# Patient Record
Sex: Male | Born: 1945 | Race: White | Hispanic: No | Marital: Married | Smoking: Never smoker
Health system: Southern US, Community
[De-identification: ages and names within clinical notes are randomized; demographics above are authoritative.]

## PROBLEM LIST (undated history)

## (undated) DIAGNOSIS — R0600 Dyspnea, unspecified: Secondary | ICD-10-CM

## (undated) DIAGNOSIS — I25810 Atherosclerosis of coronary artery bypass graft(s) without angina pectoris: Secondary | ICD-10-CM

## (undated) DIAGNOSIS — N200 Calculus of kidney: Secondary | ICD-10-CM

## (undated) DIAGNOSIS — I4510 Unspecified right bundle-branch block: Secondary | ICD-10-CM

## (undated) DIAGNOSIS — E119 Type 2 diabetes mellitus without complications: Secondary | ICD-10-CM

## (undated) DIAGNOSIS — I2119 ST elevation (STEMI) myocardial infarction involving other coronary artery of inferior wall: Secondary | ICD-10-CM

## (undated) DIAGNOSIS — Z8679 Personal history of other diseases of the circulatory system: Secondary | ICD-10-CM

## (undated) DIAGNOSIS — E785 Hyperlipidemia, unspecified: Secondary | ICD-10-CM

## (undated) DIAGNOSIS — G4730 Sleep apnea, unspecified: Secondary | ICD-10-CM

## (undated) DIAGNOSIS — G4734 Idiopathic sleep related nonobstructive alveolar hypoventilation: Secondary | ICD-10-CM

## (undated) DIAGNOSIS — N201 Calculus of ureter: Secondary | ICD-10-CM

## (undated) DIAGNOSIS — G4733 Obstructive sleep apnea (adult) (pediatric): Secondary | ICD-10-CM

## (undated) DIAGNOSIS — I739 Peripheral vascular disease, unspecified: Secondary | ICD-10-CM

## (undated) DIAGNOSIS — N529 Male erectile dysfunction, unspecified: Secondary | ICD-10-CM

## (undated) DIAGNOSIS — N183 Chronic kidney disease, stage 3 unspecified: Secondary | ICD-10-CM

## (undated) DIAGNOSIS — I10 Essential (primary) hypertension: Secondary | ICD-10-CM

## (undated) DIAGNOSIS — Z87442 Personal history of urinary calculi: Secondary | ICD-10-CM

## (undated) DIAGNOSIS — I6523 Occlusion and stenosis of bilateral carotid arteries: Secondary | ICD-10-CM

## (undated) DIAGNOSIS — R7989 Other specified abnormal findings of blood chemistry: Secondary | ICD-10-CM

## (undated) DIAGNOSIS — N261 Atrophy of kidney (terminal): Secondary | ICD-10-CM

## (undated) DIAGNOSIS — Z794 Long term (current) use of insulin: Secondary | ICD-10-CM

## (undated) DIAGNOSIS — I491 Atrial premature depolarization: Secondary | ICD-10-CM

## (undated) DIAGNOSIS — I509 Heart failure, unspecified: Secondary | ICD-10-CM

## (undated) DIAGNOSIS — I252 Old myocardial infarction: Secondary | ICD-10-CM

## (undated) DIAGNOSIS — I2510 Atherosclerotic heart disease of native coronary artery without angina pectoris: Secondary | ICD-10-CM

## (undated) DIAGNOSIS — R778 Other specified abnormalities of plasma proteins: Secondary | ICD-10-CM

## (undated) DIAGNOSIS — Z973 Presence of spectacles and contact lenses: Secondary | ICD-10-CM

## (undated) DIAGNOSIS — K219 Gastro-esophageal reflux disease without esophagitis: Secondary | ICD-10-CM

## (undated) HISTORY — DX: Peripheral vascular disease, unspecified: I73.9

## (undated) HISTORY — PX: POLYPECTOMY: SHX149

## (undated) HISTORY — PX: COLONOSCOPY: SHX174

## (undated) HISTORY — DX: Essential (primary) hypertension: I10

## (undated) HISTORY — DX: Sleep apnea, unspecified: G47.30

## (undated) HISTORY — PX: URETEROSCOPY WITH HOLMIUM LASER LITHOTRIPSY: SHX6645

## (undated) HISTORY — DX: Other specified abnormal findings of blood chemistry: R79.89

## (undated) HISTORY — DX: Obstructive sleep apnea (adult) (pediatric): G47.33

## (undated) HISTORY — DX: Other specified abnormalities of plasma proteins: R77.8

## (undated) HISTORY — DX: Male erectile dysfunction, unspecified: N52.9

## (undated) HISTORY — DX: Idiopathic sleep related nonobstructive alveolar hypoventilation: G47.34

## (undated) HISTORY — DX: Gastro-esophageal reflux disease without esophagitis: K21.9

## (undated) HISTORY — DX: Atherosclerosis of coronary artery bypass graft(s) without angina pectoris: I25.810

## (undated) HISTORY — DX: Hyperlipidemia, unspecified: E78.5

## (undated) HISTORY — PX: LEG SURGERY: SHX1003

## (undated) HISTORY — DX: Calculus of kidney: N20.0

## (undated) HISTORY — PX: VASECTOMY: SHX75

---

## 1963-09-16 HISTORY — PX: APPENDECTOMY: SHX54

## 2002-09-27 ENCOUNTER — Encounter: Admission: RE | Admit: 2002-09-27 | Discharge: 2002-09-27 | Payer: Self-pay | Admitting: Family Medicine

## 2002-09-27 ENCOUNTER — Encounter (INDEPENDENT_AMBULATORY_CARE_PROVIDER_SITE_OTHER): Payer: Self-pay | Admitting: *Deleted

## 2002-09-27 ENCOUNTER — Encounter: Payer: Self-pay | Admitting: Family Medicine

## 2003-05-26 ENCOUNTER — Encounter: Admission: RE | Admit: 2003-05-26 | Discharge: 2003-05-26 | Payer: Self-pay | Admitting: Urology

## 2003-05-26 ENCOUNTER — Encounter: Payer: Self-pay | Admitting: Urology

## 2003-05-29 ENCOUNTER — Encounter: Payer: Self-pay | Admitting: Urology

## 2003-05-29 ENCOUNTER — Ambulatory Visit (HOSPITAL_COMMUNITY): Admission: RE | Admit: 2003-05-29 | Discharge: 2003-05-29 | Payer: Self-pay | Admitting: Urology

## 2003-06-19 ENCOUNTER — Ambulatory Visit (HOSPITAL_COMMUNITY): Admission: RE | Admit: 2003-06-19 | Discharge: 2003-06-19 | Payer: Self-pay | Admitting: Urology

## 2003-11-28 ENCOUNTER — Encounter: Admission: RE | Admit: 2003-11-28 | Discharge: 2003-11-28 | Payer: Self-pay | Admitting: Urology

## 2003-11-30 ENCOUNTER — Ambulatory Visit (HOSPITAL_BASED_OUTPATIENT_CLINIC_OR_DEPARTMENT_OTHER): Admission: RE | Admit: 2003-11-30 | Discharge: 2003-11-30 | Payer: Self-pay | Admitting: Urology

## 2003-11-30 ENCOUNTER — Ambulatory Visit (HOSPITAL_COMMUNITY): Admission: RE | Admit: 2003-11-30 | Discharge: 2003-11-30 | Payer: Self-pay | Admitting: Urology

## 2003-12-29 ENCOUNTER — Encounter: Admission: RE | Admit: 2003-12-29 | Discharge: 2003-12-29 | Payer: Self-pay | Admitting: Family Medicine

## 2004-01-05 ENCOUNTER — Ambulatory Visit (HOSPITAL_COMMUNITY): Admission: RE | Admit: 2004-01-05 | Discharge: 2004-01-05 | Payer: Self-pay | Admitting: Internal Medicine

## 2004-01-05 ENCOUNTER — Encounter: Payer: Self-pay | Admitting: Internal Medicine

## 2004-02-08 ENCOUNTER — Encounter: Payer: Self-pay | Admitting: Internal Medicine

## 2004-11-15 ENCOUNTER — Encounter (INDEPENDENT_AMBULATORY_CARE_PROVIDER_SITE_OTHER): Payer: Self-pay | Admitting: *Deleted

## 2004-11-15 ENCOUNTER — Encounter: Admission: RE | Admit: 2004-11-15 | Discharge: 2004-11-15 | Payer: Self-pay | Admitting: Family Medicine

## 2004-12-09 ENCOUNTER — Ambulatory Visit: Payer: Self-pay | Admitting: Internal Medicine

## 2005-12-16 ENCOUNTER — Observation Stay (HOSPITAL_COMMUNITY): Admission: EM | Admit: 2005-12-16 | Discharge: 2005-12-17 | Payer: Self-pay | Admitting: Emergency Medicine

## 2007-06-25 ENCOUNTER — Encounter: Admission: RE | Admit: 2007-06-25 | Discharge: 2007-06-25 | Payer: Self-pay | Admitting: Family Medicine

## 2008-03-01 ENCOUNTER — Encounter (INDEPENDENT_AMBULATORY_CARE_PROVIDER_SITE_OTHER): Payer: Self-pay | Admitting: *Deleted

## 2008-03-01 ENCOUNTER — Ambulatory Visit: Payer: Self-pay | Admitting: Internal Medicine

## 2008-03-01 ENCOUNTER — Telehealth: Payer: Self-pay | Admitting: Internal Medicine

## 2008-03-01 DIAGNOSIS — E785 Hyperlipidemia, unspecified: Secondary | ICD-10-CM | POA: Insufficient documentation

## 2008-03-01 DIAGNOSIS — R110 Nausea: Secondary | ICD-10-CM

## 2008-03-01 DIAGNOSIS — Z860100 Personal history of colon polyps, unspecified: Secondary | ICD-10-CM | POA: Insufficient documentation

## 2008-03-01 DIAGNOSIS — E119 Type 2 diabetes mellitus without complications: Secondary | ICD-10-CM

## 2008-03-01 DIAGNOSIS — Z87442 Personal history of urinary calculi: Secondary | ICD-10-CM

## 2008-03-01 DIAGNOSIS — R519 Headache, unspecified: Secondary | ICD-10-CM | POA: Insufficient documentation

## 2008-03-01 DIAGNOSIS — I10 Essential (primary) hypertension: Secondary | ICD-10-CM | POA: Insufficient documentation

## 2008-03-01 DIAGNOSIS — E113392 Type 2 diabetes mellitus with moderate nonproliferative diabetic retinopathy without macular edema, left eye: Secondary | ICD-10-CM

## 2008-03-01 DIAGNOSIS — R51 Headache: Secondary | ICD-10-CM

## 2008-03-01 DIAGNOSIS — E113312 Type 2 diabetes mellitus with moderate nonproliferative diabetic retinopathy with macular edema, left eye: Secondary | ICD-10-CM | POA: Insufficient documentation

## 2008-03-01 HISTORY — DX: Type 2 diabetes mellitus with moderate nonproliferative diabetic retinopathy without macular edema, left eye: E11.3392

## 2008-03-02 ENCOUNTER — Inpatient Hospital Stay (HOSPITAL_COMMUNITY): Admission: EM | Admit: 2008-03-02 | Discharge: 2008-03-04 | Payer: Self-pay | Admitting: Emergency Medicine

## 2008-03-06 ENCOUNTER — Telehealth: Payer: Self-pay | Admitting: Internal Medicine

## 2008-03-09 DIAGNOSIS — D126 Benign neoplasm of colon, unspecified: Secondary | ICD-10-CM | POA: Insufficient documentation

## 2008-03-10 ENCOUNTER — Telehealth (INDEPENDENT_AMBULATORY_CARE_PROVIDER_SITE_OTHER): Payer: Self-pay | Admitting: *Deleted

## 2008-09-28 DIAGNOSIS — K2980 Duodenitis without bleeding: Secondary | ICD-10-CM | POA: Insufficient documentation

## 2008-10-03 ENCOUNTER — Ambulatory Visit: Payer: Self-pay | Admitting: Internal Medicine

## 2008-10-03 DIAGNOSIS — R198 Other specified symptoms and signs involving the digestive system and abdomen: Secondary | ICD-10-CM | POA: Insufficient documentation

## 2008-10-18 ENCOUNTER — Ambulatory Visit: Payer: Self-pay | Admitting: Internal Medicine

## 2008-10-18 ENCOUNTER — Encounter: Payer: Self-pay | Admitting: Internal Medicine

## 2008-10-20 ENCOUNTER — Encounter: Payer: Self-pay | Admitting: Internal Medicine

## 2009-10-02 ENCOUNTER — Encounter (INDEPENDENT_AMBULATORY_CARE_PROVIDER_SITE_OTHER): Payer: Self-pay | Admitting: *Deleted

## 2009-10-02 ENCOUNTER — Inpatient Hospital Stay (HOSPITAL_COMMUNITY): Admission: EM | Admit: 2009-10-02 | Discharge: 2009-10-05 | Payer: Self-pay | Admitting: Emergency Medicine

## 2009-10-02 ENCOUNTER — Ambulatory Visit: Payer: Self-pay | Admitting: Cardiovascular Disease

## 2009-10-05 ENCOUNTER — Encounter (INDEPENDENT_AMBULATORY_CARE_PROVIDER_SITE_OTHER): Payer: Self-pay | Admitting: Internal Medicine

## 2009-10-05 ENCOUNTER — Encounter (INDEPENDENT_AMBULATORY_CARE_PROVIDER_SITE_OTHER): Payer: Self-pay | Admitting: *Deleted

## 2009-10-19 ENCOUNTER — Ambulatory Visit: Payer: Self-pay | Admitting: Internal Medicine

## 2009-10-19 DIAGNOSIS — R195 Other fecal abnormalities: Secondary | ICD-10-CM

## 2009-10-19 DIAGNOSIS — R1319 Other dysphagia: Secondary | ICD-10-CM

## 2009-10-19 DIAGNOSIS — D509 Iron deficiency anemia, unspecified: Secondary | ICD-10-CM | POA: Insufficient documentation

## 2009-10-22 ENCOUNTER — Ambulatory Visit: Payer: Self-pay | Admitting: Internal Medicine

## 2009-10-23 ENCOUNTER — Telehealth (INDEPENDENT_AMBULATORY_CARE_PROVIDER_SITE_OTHER): Payer: Self-pay | Admitting: *Deleted

## 2009-10-23 ENCOUNTER — Encounter: Payer: Self-pay | Admitting: Internal Medicine

## 2009-10-24 ENCOUNTER — Telehealth: Payer: Self-pay | Admitting: Internal Medicine

## 2009-10-25 ENCOUNTER — Encounter: Payer: Self-pay | Admitting: Internal Medicine

## 2009-10-30 ENCOUNTER — Telehealth: Payer: Self-pay | Admitting: Internal Medicine

## 2009-10-31 ENCOUNTER — Ambulatory Visit: Payer: Self-pay | Admitting: Internal Medicine

## 2009-11-15 ENCOUNTER — Telehealth: Payer: Self-pay | Admitting: Internal Medicine

## 2009-12-19 ENCOUNTER — Ambulatory Visit: Payer: Self-pay | Admitting: Internal Medicine

## 2009-12-20 LAB — CONVERTED CEMR LAB
Basophils Absolute: 0.1 10*3/uL (ref 0.0–0.1)
Basophils Relative: 1.1 % (ref 0.0–3.0)
HCT: 37.6 % — ABNORMAL LOW (ref 39.0–52.0)
Lymphs Abs: 1.6 10*3/uL (ref 0.7–4.0)
MCHC: 33.8 g/dL (ref 30.0–36.0)
Monocytes Absolute: 0.6 10*3/uL (ref 0.1–1.0)
Monocytes Relative: 8.6 % (ref 3.0–12.0)
Neutro Abs: 4.3 10*3/uL (ref 1.4–7.7)
RBC: 4.38 M/uL (ref 4.22–5.81)

## 2010-10-17 NOTE — Progress Notes (Signed)
Summary: Test results  Phone Note Call from Patient Call back at Home Phone [phone]   Caller: Patient Call For: Dr. Henrene Pastor Reason for Call: Talk to Nurse Summary of Call: Pt is calling about his capsule Endo results Initial call taken by: Webb Laws,  November 15, 2009 10:34 AM  Follow-up for Phone Call         Pt. informed again that we will call him as soon as results are available. Follow-up by: Abel Presto RN,  November 15, 2009 11:23 AM     Appended Document: Test results please let the patient know that his capsule endoscopy did not show any obvious significant abnormality. I want him to take his iron twice daily every day and have his CBC rechecked here in about 4 weeks.  Appended Document: Test results Pt. notified of Dr.Perry's orders.

## 2010-10-17 NOTE — Assessment & Plan Note (Signed)
Summary: anemia / loss appetite / nausea..   History of Present Illness Visit Type: Follow-up Visit Primary GI MD: Scarlette Shorts MD Primary Provider: Ann Held Requesting Provider: n/a Chief Complaint: nausea, loss of appetite, LLQ abdominal pain, hem pos stool while in hospital History of Present Illness:   65 year old white male with hypertension, hyperlipidemia, diabetes mellitus, and adenomatous colon polyps. He sent today regarding iron deficiency anemia and Hemoccult-positive stool. The patient's hemoglobin on October 02, 2009 was normal at 13.6 with a normal MCV of 84.5. He was hospitalized January 18 through January 21 with acute bronchitis. At that time, he was noted to have anemia with a hemoglobin of 11.8. The MCV was 84.8. The ferritin was 21. B12 and folate levels were normal. A CT scan of the abdomen and pelvis was negative except for renal calculi. He is accompanied today by his wife. She participates in the office encounter. The patient has had some vague nausea and loss of appetite that has been present for months. Zofran helped his nausea. He requests that prescription refill.[name] His left lower quadrant pain has improved. He last underwent complete colonoscopy in February 2010. Examination was normal except for diminutive colon polyps. He does take 81 mg aspirin. He was recently placed on iron. He denies melena or hematochezia. No weight loss. Upper endoscopy in 2005 was essentially normal.   GI Review of Systems    Reports loss of appetite and  nausea.      Denies abdominal pain, acid reflux, belching, bloating, chest pain, dysphagia with liquids, dysphagia with solids, heartburn, vomiting, vomiting blood, weight loss, and  weight gain.      Reports heme positive stool.     Denies anal fissure, black tarry stools, change in bowel habit, constipation, diarrhea, diverticulosis, fecal incontinence, hemorrhoids, irritable bowel syndrome, jaundice, light color stool, liver  problems, rectal bleeding, and  rectal pain.    Current Medications (verified): 1)  Avandamet 12-998 Mg Tabs (Rosiglitazone-Metformin) .... Take 1 Tablet By Mouth Two Times A Day 2)  Vytorin 10-40 Mg  Tabs (Ezetimibe-Simvastatin) .... Once A Day 3)  Nexium 40 Mg  Cpdr (Esomeprazole Magnesium) .... Once A Day 4)  Aspirin 81 Mg  Tabs (Aspirin) .... Once Daily 5)  Januvia 100 Mg  Tabs (Sitagliptin Phosphate) .... Once A Day 6)  Glimepiride 4 Mg  Tabs (Glimepiride) .... Take 1 1/2 Tablets By Mouth Once Daily 7)  Norvasc 5 Mg  Tabs (Amlodipine Besylate) .... Once Daily 8)  Iron 325 (65 Fe) Mg Tabs (Ferrous Sulfate) .... Take 1 Tablet By Mouth Once A Day 9)  Tussionex Pennkinetic Er 8-10 Mg/63ml Lqcr (Chlorpheniramine-Hydrocodone) .... Take 2ml By Mouth Two Times A Day As Needed  Allergies: 1)  ! * Levoquin 2)  Tubersol (Tuberculin Ppd)  Past History:  Past Medical History: Reviewed history from 03/01/2008 and no changes required. Current Problems:  NEPHROLITHIASIS, HX OF (ICD-V13.01) HYPERLIPIDEMIA (ICD-272.4) HYPERTENSION (ICD-401.9) DM (ICD-250.00) COLONIC POLYPS, HX OF (ICD-V12.72) Headache  Past Surgical History: Reviewed history from 03/01/2008 and no changes required. appendectomy lithotripsy 11/04  Family History: Family History of Heart Disease: mother father had cancer not sure what kind No FH of Colon Cancer:  Social History: Reviewed history from 03/01/2008 and no changes required. Patient has never smoked.  Alcohol Use - no Daily Caffeine Use  1 cup coffee per day 2 16 oz drinks a day Illicit Drug Use - no Patient does not get regular exercise.  Married With 3 children Occupation: Retired  Review  of Systems       sinus allergies, anxiety/depression, kidney stones, headaches,joint aches. All other systems reviewed and reported as negative  Vital Signs:  Patient profile:   65 year old male Height:      66 inches Weight:      194 pounds BMI:      31.43 BSA:     1.98 Pulse rate:   88 / minute Pulse rhythm:   regular BP sitting:   110 / 72  (left arm) Cuff size:   regular  Vitals Entered By: Vail Deborra Medina) (October 19, 2009 8:54 AM)  Physical Exam  General:  Well developed, well nourished, no acute distress. Head:  Normocephalic and atraumatic. Eyes:  PERRLA, no icterus. Nose:  No deformity, discharge,  or lesions. Mouth:  No deformity or lesions, dentition normal. Neck:  Supple; no masses or thyromegaly. Lungs:  Clear throughout to auscultation. Heart:  Regular rate and rhythm; no murmurs, rubs,  or bruits. Abdomen:  Soft, nontender and nondistended. No masses, hepatosplenomegaly or hernias noted. Normal bowel sounds. Msk:  Symmetrical with no gross deformities. Normal posture. Pulses:  Normal pulses noted. Extremities:  No clubbing, cyanosis, edema or deformities noted. Neurologic:  Alert and  oriented x4;  grossly normal neurologically. Skin:  Intact without significant lesions or rashes. Psych:  Alert and cooperative. Normal mood and affect.   Impression & Recommendations:  Problem # 1:  ANEMIA-IRON DEFICIENCY (ICD-280.9) Anemia suggesting iron deficiency with low ferritin. Hemoccult-Positive stool (792.1) as well. Colonoscopy one year ago as described. Prior colonoscopy in 2005 as well. Rule out upper GI mucosal lesion or small bowel mucosal lesion.  Plan: #1. Upper endoscopy. The nature of the procedure as well as the risks, benefits, and alternatives have been reviewed. He understood and agreed to proceed.. Need to adjust diabetic medications for the procedure. See below. #2. If upper endoscopy negative then consider capsule endoscopy. I discussed this in detail today with the patient and his wife. They understand and are agreeable. #3. Continue iron #4. Followup CBC about one month  Problem # 2:  FECAL OCCULT BLOOD (ICD-792.1) rule out GI mucosal lesion. Plan for upper endoscopy and possible  capsule endoscopy as discussed. No plan to repeat colonoscopy at this time  Problem # 3:  GASTROESOPHAGEAL REFLUX DISEASE, MILD (ICD-530.81) no symptoms on Nexium. Continue Nexium and reflux precautions  Problem # 4:  NAUSEA (ICD-787.02) vague intermittent chronic nausea. Ongoing.  Plan:  #1. Refill prescription of Zofran 4 mg q.4-6 hours p.r.n.  Problem # 5:  DM (ICD-250.00) need to adjust diabetic medications for his procedure. He will hold diabetic medications the morning of the exam until he has resumed normal p.o. intake. We will monitor his blood sugar immediately before and after his procedure.  Other Orders: EGD (EGD)  Patient Instructions: 1)  EGD Christie 10/22/09 2:00 pm 2)  Upper Endoscopy brochure given.  3)  Hold oral diabetic meds morning of procedure. 4)  Zofran #60 1 by mouth q 4 hours as needed nausea x 3 RFs 5)  The medication list was reviewed and reconciled.  All changed / newly prescribed medications were explained.  A complete medication list was provided to the patient / caregiver. 6)  Copy: Dr. Barney Drain Prescriptions: ZOFRAN 4 MG TABS (ONDANSETRON HCL) 1 q 6 hours as needed nausea  #60 x 3   Entered by:   Randye Lobo NCMA   Authorized by:   [name] MD   Signed by:  Randye Lobo NCMA on 10/19/2009   Method used:   Electronically to        [facility]  [phone]* (retail)       [place], [place]  [address]       Ph: [phone] or [phone]       Fax: [phone]   RxID:   [id number]

## 2010-10-17 NOTE — Progress Notes (Signed)
Summary: prep ?'s  Phone Note Call from Patient Call back at Home Phone [phone]   Caller: wife, Romie Minus Call For: Dr. Henrene Pastor Reason for Call: Talk to Nurse Summary of Call: prep ?'s Initial call taken by: Lucien Mons,  October 30, 2009 10:23 AM  Follow-up for Phone Call         Pt's question's answered about capsule prep.Clarified with Dr.Jacob's re: grape colored citrate of magnesia-should not use. Follow-up by: Abel Presto RN,  October 30, 2009 11:22 AM

## 2010-10-17 NOTE — Procedures (Signed)
Summary: Instructions for procedure/[facility]  Instructions for procedure/[facility]   Imported By: Phillis Knack 10/26/2009 16:07:35  _____________________________________________________________________  External Attachment:    Type:   Image     Comment:   External Document

## 2010-10-17 NOTE — Procedures (Signed)
Summary: Upper Endoscopy  Patient: Fernando Hess Note: All result statuses are Final unless otherwise noted.  Tests: (1) Upper Endoscopy (EGD)   EGD Upper Endoscopy       [facility]     [place], [place]  [address]           ENDOSCOPY PROCEDURE REPORT           PATIENT:  Fernando Hess, Fernando Hess  MR#:  [id number]     BIRTHDATE:  Apr 23, 1946, 74 yrs. old  GENDER:  male           ENDOSCOPIST:  Docia Chuck. Geri Seminole, MD     Referred by:  Office           PROCEDURE DATE:  10/22/2009     PROCEDURE:  EGD with biopsy     ASA CLASS:  Class II     INDICATIONS:  FOBT + stool, iron deficiency anemia           MEDICATIONS:   Fentanyl 75 mcg IV, Versed 8 mg IV     TOPICAL ANESTHETIC:  Exactacain Spray           DESCRIPTION OF PROCEDURE:   After the risks benefits and     alternatives of the procedure were thoroughly explained, informed     consent was obtained.  The [facility] GIF-H180 [id number] endoscope was     introduced through the mouth and advanced to the third portion of     the duodenum, without limitations.  The instrument was slowly     withdrawn as the mucosa was fully examined.     <<PROCEDUREIMAGES>>           Mild gastritis (erythema and erosions) was found at body / antrum     junction. Biopsies taken.  Otherwise the examination was normal to     D3.    Retroflexed views revealed no abnormalities.    The scope     was then withdrawn from the patient and the procedure completed.           COMPLICATIONS:  None           ENDOSCOPIC IMPRESSION:     1) Mild gastritis - s/p bx     2) Otherwise normal examination           RECOMMENDATIONS:     1) await biopsy results     2) MY OFFICE WILL SCHEDULE A CAPSULE ENDOSCOPY "iron def anemia /     heme + stool"           ______________________________     Docia Chuck. Geri Seminole, MD           CC:  Barney Drain, MD, The Patient           n.     eSIGNED:   Docia Chuck. Geri Seminole at 10/22/2009 02:25 PM           Bjorn Pippin,  [id number]  Note: An exclamation mark (!) indicates a result that was not dispersed into the flowsheet. Document Creation Date: 10/22/2009 2:26 PM _______________________________________________________________________  (1) Order result status: Final Collection or observation date-time: 10/22/2009 14:14 Requested date-time:  Receipt date-time:  Reported date-time:  Referring Physician:   Ordering Physician: Lavena Bullion [phone]) Specimen Source:  Source: Tawanna Cooler Order Number: [id number] Lab site:

## 2010-10-17 NOTE — Procedures (Signed)
Summary: 280.0/cl  Patient here today for capsule endoscopy for Dr. Henrene Pastor.  Pt verbalized understanding of all verbal and written instructions.  Pt tolerated well.  Lot #  [id number]  01/2011. Barb Merino RN, [facility]  October 31, 2009 10:28 AM

## 2010-10-17 NOTE — Discharge Summary (Signed)
NAME:  Fernando Hess, Fernando Hess NO.:  [id number]      MEDICAL RECORD NO.:  [id number]          PATIENT TYPE:  INP      LOCATION:  [id number]                         FACILITY:  [facility]      PHYSICIAN:  Sharlet Salina, M.D.   DATE OF BIRTH:  1946/02/06      DATE OF ADMISSION:  10/02/2009   DATE OF DISCHARGE:  10/05/2009                                  DISCHARGE SUMMARY      DISCHARGE DIAGNOSES:   1. Dyspnea/acute bronchitis.   2. Left lower quadrant pain.   3. Diabetes.   4. Hypertension.   5. Dyslipidemia.   6. History of renal calculus.   7. Iron deficiency anemia.   8. Dehydration.      DISCHARGE MEDICATIONS:   1. Azithromycin 500 mg daily for 5 days.   2. Iron 325 mg daily.   3. Prednisone taper.   4. Amlodipine 5 mg daily.   5. Aspirin 81 mg daily.   6. Avandamet 12/998 twice daily.   7. Amaryl 4 mg 1-1/2 tablets daily.   8. Januvia 100 mg daily.   9. Nexium 40 mg daily.   10.Tussionex 5 mL q.12 hours p.r.n.   11.Vytorin 1 tablet by mouth daily.      FOLLOWUP APPOINTMENTS:  The patient to follow up for routine colonoscopy   colon cancer screening and the patient also to follow up with PCP in 1   week.      CONSULTANTS:  None.      HISTORY OF PRESENT ILLNESS:  The patient is a 65 year old male with   known diabetes and hypertension, as well as high cholesterol.  He has a   strong family history of both parents with heart disease.  His father   did smoke.  The patient endorses a 1-1/2-week history of upper   respiratory problems, beginning with cold symptoms of runny nose and   cough.  He presented to the primary care physician and was treated with   Tussionex and Z-Pak.  After several days of treatment, he felt better.   Over the past few days, he thought he had increased breathing with   walking, cannot sleep.  __________ This was due to coughing.  He had no   chest pain or palpitations.  He also had some abdominal pain.  Please   see admission note for  remainder of history.      Past medical history, family history, social history, meds, allergies,   and review of systems per admission history and physical.      PHYSICAL EXAMINATION ON DISCHARGE:  VITAL SIGNS:  Temperature 98.3,   pulse 65, respirations 20, blood pressure 125/63, pulse ox 95% on room   air.   GENERAL:  The patient is sitting up in a chair, well-nourished white   male.   HEENT:  Head is normocephalic, atraumatic.  Pupils reactive to light.   Throat without erythema.   CARDIOVASCULAR:  Regular rate and rhythm.   LUNGS:  Clear bilaterally.  No wheezes, rhonchi, or rales.   ABDOMEN:  Soft, nontender, and nondistended.  Positive bowel sounds.   EXTREMITIES:  No edema.      HOSPITAL COURSE:   1. Acute bronchitis/upper respiratory infection/dyspnea:  The patient       was admitted to the hospital.  He had a D-dimer done that was       negative.  Chest x-ray did not show any acute infiltrate.  He then       had a 2-D echo done that was normal.  He was treated empirically       with IV antibiotics and pulse steroids for possible acute       bronchitis.  His symptoms improved.  He will be discharged on a       prednisone taper and completion of azithromycin.   2. Diabetes:  He was placed on Lantus and sliding scale insulin       secondary to hyperglycemia from the steroids.  He will be       discharged on his home medication, as he has now been tapered off       steroids.   3. Iron deficiency anemia:  He was noted have anemia which his       ferritin level was low.  He will be discharged on iron.  He states       that he has a colonoscopy coming up soon.  Encouraged him to get       his colonoscopy done even though his stool was guaiac negative.  We       will start him on iron.  He will follow up with his PCP regarding       his anemia.   4. Dyslipidemia:  He was continued on his Vytorin during his       hospitalization.   5. Abdominal pain:  The patient's abdominal  pain resolved when he was       in the emergency room and no further recurrence of his abdominal       pain.  A CT scan of the abdomen to evaluate his abdominal pain was       negative.      DISCHARGE LABORATORY DATA:  Guaiac negative.  Sodium 141, potassium 3.9,   chloride 108, CO2 of 28, glucose 160, BUN 22, creatinine 1.19.  WBC   13.2, hemoglobin 11.8, platelets 192, MCV of 84.8.  Ferritin of 21,   folate 14.5, vitamin B12 of 602.  Hemoglobin A1c 7.1.  CK 94, MB 1.5,   troponin less than 0.01, BNP less than 30.  D-dimer less than 0.22.  CT   scan of the abdomen and pelvis:  Bilateral renal calculi, but no   obstructing ureteral calculi. Two-D echo:  LV systolic function was [id number]   to 60%, no regional wall motion abnormality.      Time spent with the patient and doing this dictation was approximately   45 minutes.               Sharlet Salina, M.D.            [place]/MEDQ  D:  10/05/2009  T:  10/05/2009  Job:  [id number]      cc:   Dr. Cheron Schaumann

## 2010-10-17 NOTE — Progress Notes (Signed)
Summary: Questions about Capsule Endo next week  Phone Note Call from Patient Call back at Home Phone [phone]   Caller: Patient Call For: Dr. Henrene Pastor Reason for Call: Talk to Nurse Summary of Call: Pt. has some questions about his procedure for next week. Initial call taken by: Webb Laws,  October 24, 2009 4:58 PM  Follow-up for Phone Call         Questions answered in regards  to the shaving of the area. Follow-up by: Abel Presto RN,  October 25, 2009 8:48 AM

## 2010-10-17 NOTE — Progress Notes (Signed)
  Phone Note Outgoing Call   Summary of Call:  Pt. scheduled for capsule endo here on 10/31/09 at 8 am. Given instructions. Initial call taken by: Abel Presto RN,  October 23, 2009 2:49 PM

## 2010-10-17 NOTE — Procedures (Signed)
Summary: Capsule Endoscopy   Capsule Endoscopy  Procedure date:  10/31/2009  Findings:      Performing Location: [facility]   Ordering Physician: Scarlette Shorts, MD  Report created/read by: Scarlette Shorts, MD  Reason for Referral:  65 y/o male with an iron deficiency anemia, heme positive stools.  Procedure Information and Findings:  1) Complete study, good prep 2) few tiny erosions 3) Few tiny red spots of unclear significance 4) submucosal bulge at 4 hrs 20 minutes , this is only seen in 2 frames, does not persist, no ulceration visible, may represent a prominent fold.  Summary and Recommendations:  Continue iron supplementation, serial HGB's if patient has evidence for ongoing blood loss may warrant further investigation.  This report was created from the original report, which was reviewed and signed by the above listed reading physician.

## 2010-10-17 NOTE — Letter (Signed)
Summary: Patient [facility] Biopsy Results  [facility]  [place], [place] [address]   Phone: [phone]  Fax: [phone]        October 25, 2009 MRN: [id number]    [facility] [address] [place],   [address]    Dear Mr. Sween,  I am pleased to inform you that the biopsies taken during your recent endoscopic examination did not show any significant abnormality. Only mild gastritis as suspected.  Additional information/recommendations:   __ Continue with the treatment plan as outlined on the day of your      exam.    Please call us if you are having persistent problems or have questions about your condition that have not been fully answered at this time.  Sincerely,  [name] MD  This letter has been electronically signed by your physician.  Appended Document: Patient Notice-Endo Biopsy Results letter mailed 2.11.11

## 2010-10-17 NOTE — Letter (Signed)
Summary: EGD Instructions  [facility]  [place], [place] [address]   Phone: [phone]  Fax: [phone]       Fernando Hess    [birth year]    MRN: [id number]       Procedure Day /Date:MONDAY 10/22/09     Arrival Time: 1:00 PM     Procedure Time:2:00 PM     Location of Procedure:                    [facility] (4th Floor)    PREPARATION FOR ENDOSCOPY   On MONDAY 10/22/09 THE DAY OF THE PROCEDURE:  1.   No solid foods, milk or milk products are allowed after midnight the night before your procedure.  2.   Do not drink anything colored red or purple.  Avoid juices with pulp.  No orange juice.  3.  You may drink clear liquids until12 NOON which is 2 hours before your procedure.                                                                                                CLEAR LIQUIDS INCLUDE: Water Jello Ice Popsicles Tea (sugar ok, no milk/cream) Powdered fruit flavored drinks Coffee (sugar ok, no milk/cream) Gatorade Juice: apple, white grape, white cranberry  Lemonade Clear bullion, consomm, broth Carbonated beverages (any kind) Strained chicken noodle soup Hard Candy   MEDICATION INSTRUCTIONS  Unless otherwise instructed, you should take regular prescription medications with a small sip of water as early as possible the morning of your procedure.             OTHER INSTRUCTIONS  You will need a responsible adult at least 67 years of age to accompany you and drive you home.   This person must remain in the waiting room during your procedure.  Wear loose fitting clothing that is easily removed.  Leave jewelry and other valuables at home.  However, you may wish to bring a book to read or an iPod/MP3 player to listen to music as you wait for your procedure to start.  Remove all body piercing jewelry and leave at home.  Total time from sign-in until discharge is approximately 2-3 hours.  You should go home  directly after your procedure and rest.  You can resume normal activities the day after your procedure.  The day of your procedure you should not:   Drive   Make legal decisions   Operate machinery   Drink alcohol   Return to work  You will receive specific instructions about eating, activities and medications before you leave.    The above instructions have been reviewed and explained to me by   _______________________    I fully understand and can verbalize these instructions _____________________________ Date _________

## 2010-10-17 NOTE — Miscellaneous (Signed)
Summary: capsule endo order  Clinical Lists Changes  Orders: Added new Test order of Capsule Endoscopy (Capsule Endoscopy) - Signed

## 2010-12-02 LAB — CARDIAC PANEL(CRET KIN+CKTOT+MB+TROPI)
CK, MB: 1.5 ng/mL (ref 0.3–4.0)
Relative Index: INVALID (ref 0.0–2.5)
Relative Index: INVALID (ref 0.0–2.5)
Total CK: 71 U/L (ref 7–232)
Total CK: 94 U/L (ref 7–232)
Troponin I: 0.01 ng/mL (ref 0.00–0.06)
Troponin I: <0.01 ng/mL (ref 0.00–0.06)

## 2010-12-02 LAB — CBC
HCT: 34.8 % — ABNORMAL LOW (ref 39.0–52.0)
HCT: 35.3 % — ABNORMAL LOW (ref 39.0–52.0)
Hemoglobin: 11.7 g/dL — ABNORMAL LOW (ref 13.0–17.0)
Hemoglobin: 11.8 g/dL — ABNORMAL LOW (ref 13.0–17.0)
Hemoglobin: 12.9 g/dL — ABNORMAL LOW (ref 13.0–17.0)
MCHC: 33.1 g/dL (ref 30.0–36.0)
MCV: 84.8 fL (ref 78.0–100.0)
Platelets: 192 10*3/uL (ref 150–400)
Platelets: 202 10*3/uL (ref 150–400)
Platelets: 209 10*3/uL (ref 150–400)
RBC: 4.1 MIL/uL — ABNORMAL LOW (ref 4.22–5.81)
RDW: 15.6 % — ABNORMAL HIGH (ref 11.5–15.5)
RDW: 16.1 % — ABNORMAL HIGH (ref 11.5–15.5)
WBC: 13.2 10*3/uL — ABNORMAL HIGH (ref 4.0–10.5)
WBC: 8.7 10*3/uL (ref 4.0–10.5)

## 2010-12-02 LAB — GLUCOSE, CAPILLARY
Glucose-Capillary: 208 mg/dL — ABNORMAL HIGH (ref 70–99)
Glucose-Capillary: 246 mg/dL — ABNORMAL HIGH (ref 70–99)
Glucose-Capillary: 356 mg/dL — ABNORMAL HIGH (ref 70–99)
Glucose-Capillary: 382 mg/dL — ABNORMAL HIGH (ref 70–99)
Glucose-Capillary: 39 mg/dL — CL (ref 70–99)
Glucose-Capillary: 55 mg/dL — ABNORMAL LOW (ref 70–99)

## 2010-12-02 LAB — DIFFERENTIAL
Basophils Relative: 1 % (ref 0–1)
Eosinophils Absolute: 0 10*3/uL (ref 0.0–0.7)
Lymphocytes Relative: 12 % (ref 12–46)
Lymphs Abs: 1.1 10*3/uL (ref 0.7–4.0)
Monocytes Absolute: 0.5 10*3/uL (ref 0.1–1.0)
Monocytes Relative: 5 % (ref 3–12)

## 2010-12-02 LAB — FERRITIN: Ferritin: 21 ng/mL — ABNORMAL LOW (ref 22–322)

## 2010-12-02 LAB — IRON AND TIBC
Saturation Ratios: 7 % — ABNORMAL LOW (ref 20–55)
TIBC: 367 ug/dL (ref 215–435)

## 2010-12-02 LAB — URINALYSIS, ROUTINE W REFLEX MICROSCOPIC
Ketones, ur: 15 mg/dL — AB
Leukocytes, UA: NEGATIVE
Protein, ur: 100 mg/dL — AB
Urobilinogen, UA: 0.2 mg/dL (ref 0.0–1.0)
pH: 5.5 (ref 5.0–8.0)

## 2010-12-02 LAB — BASIC METABOLIC PANEL
BUN: 20 mg/dL (ref 6–23)
BUN: 27 mg/dL — ABNORMAL HIGH (ref 6–23)
CO2: 23 mEq/L (ref 19–32)
CO2: 24 mEq/L (ref 19–32)
Calcium: 8.9 mg/dL (ref 8.4–10.5)
Chloride: 108 mEq/L (ref 96–112)
Chloride: 109 mEq/L (ref 96–112)
GFR calc Af Amer: 52 mL/min — ABNORMAL LOW (ref >60)
GFR calc Af Amer: >60 mL/min (ref >60)
GFR calc non Af Amer: 55 mL/min — ABNORMAL LOW (ref >60)
GFR calc non Af Amer: >60 mL/min (ref >60)
Glucose, Bld: 361 mg/dL — ABNORMAL HIGH (ref 70–99)
Glucose, Bld: 85 mg/dL (ref 70–99)
Potassium: 3.9 mEq/L (ref 3.5–5.1)
Potassium: 4.4 mEq/L (ref 3.5–5.1)
Sodium: 136 mEq/L (ref 135–145)
Sodium: 141 mEq/L (ref 135–145)

## 2010-12-02 LAB — POCT CARDIAC MARKERS
CKMB, poc: <1 ng/mL — ABNORMAL LOW (ref 1.0–8.0)
Myoglobin, poc: 134 ng/mL (ref 12–200)
Troponin i, poc: <0.05 ng/mL (ref 0.00–0.09)

## 2010-12-02 LAB — FOLATE: Folate: 14.5 ng/mL

## 2010-12-02 LAB — HEMOGLOBIN A1C: Hgb A1c MFr Bld: 7.1 % — ABNORMAL HIGH (ref 4.6–6.1)

## 2010-12-02 LAB — HEMOCCULT GUIAC POC 1CARD (OFFICE): Fecal Occult Bld: NEGATIVE

## 2010-12-02 LAB — RETICULOCYTES: Retic Ct Pct: 1.1 % (ref 0.4–3.1)

## 2010-12-04 LAB — GLUCOSE, CAPILLARY: Glucose-Capillary: 97 mg/dL (ref 70–99)

## 2010-12-31 ENCOUNTER — Other Ambulatory Visit: Payer: Self-pay | Admitting: Family Medicine

## 2010-12-31 ENCOUNTER — Ambulatory Visit
Admission: RE | Admit: 2010-12-31 | Discharge: 2010-12-31 | Disposition: A | Discharge status: Home / Self Care | Payer: Medicare Other | Source: Ambulatory Visit | Attending: Family Medicine | Admitting: Family Medicine

## 2010-12-31 LAB — GLUCOSE, CAPILLARY: Glucose-Capillary: 81 mg/dL (ref 70–99)

## 2011-01-28 NOTE — H&P (Signed)
NAME:  Fernando Hess, Fernando Hess NO.:  [id number]   MEDICAL RECORD NO.:  [id number]          PATIENT TYPE:  EMS   LOCATION:  ED                           FACILITY:  [facility]   PHYSICIAN:  Annita Brod, M.D.DATE OF BIRTH:  [birth year]   DATE OF ADMISSION:  03/01/2008  DATE OF DISCHARGE:                              HISTORY & PHYSICAL   PRIMARY CARE PHYSICIAN:  Osvaldo Human, M.D.   CHIEF COMPLAINT:  Weakness.   HISTORY OF PRESENT ILLNESS:  The patient is a 65 year old white male  with past medical history of diabetes mellitus, hypertension,  hyperlipidemia who for the last several days has been feeling weak  complaining of hurting all over and could not take it anymore and he has  also note some mildly loose stool and came into the emergency room. Four  to five months ago, he was previously hospitalized at an outside  facility for severe diarrhea and weakness.  He has not been on an  antibiotics since then and he said he has had no medical problems.  He  has not really noticed any other constitutional symptoms prior to  yesterday. He has noted no shortness of breath, no cough, no increased  urination and no rash or ulcers.  He said he had a small insect bite  about a week ago on his left arm and there was a mild area of irritation  and itchiness, but no severe pain or generalized inflammation. Again, he  is having some loose stools over the last day, but has complained of  hurting all over and could not take it anymore and came into the  emergency room.  In the emergency room the patient had labs done on  admission. He was noted to have a negative urinalysis other than some  small bilirubin and 100 glucose, but more concerning was his white count  of 11.9 and 91% shift.  While the patient was in the emergency room, his  blood pressure started to dropped down into the 80s requiring multiple  fluid boluses. His CMET noted a bilirubin at 1.5 and a BUN of 22,  creatinine  1.3, glucose was 190.  Flu titers were negative and ABG was  done with a venous sample unfortunately and therefore did not show  anything of value.  The patient himself is currently starting to feel a  little bit better, but still feels extremely weak and complained of  generalized body aches all over.  He denies any headaches, vision  changes, dysphagia, no chest pain, palpitation, no shortness of breath,  wheeze, cough, no abdominal pain.  No hematuria, dysuria, no  constipation.  He has had several loose stools, almost diarrhea like, in  the last day and no focal extremity numbness, weakness or pain.  He says  he complains of aches and pains all over.  Review of systems otherwise  negative.   PAST MEDICAL HISTORY:  1. Diabetes.  2. Hyperlipidemia.  3. Hypertension.   MEDICATION:  He takes metformin. He cannot remember the rest of his  medications.  His medications have been taken down,  but currently the  computers are down.  But he says he takes a couple of medicines for  diabetes and one for cholesterol and one for lipids.   ALLERGIES:  NO KNOWN DRUG ALLERGIES.   SOCIAL HISTORY:  No tobacco, alcohol or drug use.   FAMILY HISTORY:  Is noncontributory.   PHYSICAL EXAM:  Blood pressure is initially improved as it was low at  86/45 and since then has bumped up to systolic of [id number].  His heart rate  is 97.  He has a low grade temperature, respiratory rate is 16.  GENERAL:  He is alert and oriented x3, but occasionally after feeling so  weak he is somewhat drowsy.  HEENT:  Normocephalic atraumatic.  Mucous membranes slightly dry.  He  has no carotid bruits.  HEART:  Heart is regular rate and rhythm S1-S2.  LUNGS:  Clear to auscultation bilaterally.  ABDOMEN:  Soft, nontender and mild distention and hypoactive bowel  sounds.  EXTREMITIES: Show no clubbing, cyanosis or edema.   LAB WORK:  Influenza titers are negative.  Sodium 137, potassium 3.9,  chloride 104, bicarb 25, BUN 22,  creatinine 1.3, glucose 190, total bili  1.5. Rest of LFTs normal including an albumin 3.5.  His white count  11.9, H&H 14.5 and 43, MCV of 86, platelet count 242 with 91% shift and  his urinalysis notes trace ketones, small bilirubin, 100 glucose and  trace leukocytes, but only three to six white cells.  Chest x-ray is  unremarkable.   PLAN:  1. Weakness, hypotension. Concerned about the possibility of an      underlying infection especially with reports of diarrhea.  Will go      ahead and check a CT abdomen and pelvis.  In the meantime      aggressively hydrate.  No overt source yet.  2. Diarrhea.  See above.  Will check a CT to rule out colitis.  3. Diabetes mellitus.  Sliding scale insulin before his renal failure.      Unclear if this is acute versus chronic.  Again, will hydrate.      Continue to follow and also check a CPK level to ensure that he      does not have rhabdomyolysis.      Annita Brod, M.D.  Electronically Signed     SKK/MEDQ  D:  03/01/2008  T:  03/01/2008  Job:  [id number]   cc:   Osvaldo Human, M.D.  Fax: [phone]

## 2011-01-31 NOTE — Op Note (Signed)
NAME:  Fernando Hess, Fernando Hess                          ACCOUNT NO.:  [id number]   MEDICAL RECORD NO.:  [id number]                   PATIENT TYPE:  AMB   LOCATION:  N[facility]C                                 FACILITY:  [facility]   PHYSICIAN:  Bernestine Amass, M.D.               DATE OF BIRTH:  Apr 10, 1946   DATE OF PROCEDURE:  11/30/2003  DATE OF DISCHARGE:                                 OPERATIVE REPORT   PREOPERATIVE DIAGNOSIS:  Right mid ureteral stone.   POSTOPERATIVE DIAGNOSIS:  Right mid ureteral stone.   OPERATION/PROCEDURE:  1. Cystoscopy.  2. Right retrograde pyelography.  3. Right ureteroscopy.  4. Stone basketing.  5. Double-J stent placement.   SURGEON:  Bernestine Amass, M.D.   ANESTHESIA:  General.   INDICATIONS:  [name] is a 65 year old male with history of recurrent  nephrolithiasis.  He was recently treated for left-sided ureteral calculus.  He was known to have several stones in his right kidney, the largest of  which was about 4-5 mm in size.  He recently developed recurrent right flank  pain and a CT scan the other day in our office showed a midline ureteral  calculus 4-5 mm in size.  The patient had had a poor previous track record  of passing stones and requested intervention.  He presents now for  definitive treatment of the stone.   DESCRIPTION OF PROCEDURE:  The patient was brought to the operating room  where he had successful induction of general anesthesia.  He was placed in  the lithotomy position and prepped and draped in the usual manner.   Cystoscopy was unremarkable.  Retrograde pyelogram showed a filling defect  in the midline ureter on the right.  A guidewire was passed up to the renal  pelvis.  The ureteral orifice was quite snug and for that reason we used the  inside portion of an access sheath to do a one-step dilation of the distal  aspect of the ureter.  Once that was completed, we were easily able to  engage the 6-French short ureteroscope into the  distal ureter.  In the  midline ureter, a 4-5 mm stone was encountered.  This was basket extracted  with a nitinol basket without any difficulty.  Because of the need for  dilation of the distal ureter, we felt it prudent to leave the double-J  stent for a few days.  A 6-French 24 cm stent was placed over the guidewire  with fluoroscopic as well as visual guidance.  A string was left unplaced  and taped to the patient's penis.  The patient appeared to tolerate the  procedure well.  There were no obvious complications.  Bernestine Amass, M.D.   DSG/MEDQ  D:  11/30/2003  T:  11/30/2003  Job:  [id number]

## 2011-01-31 NOTE — H&P (Signed)
NAME:  Fernando Hess, MALEC NO.:  [id number]   MEDICAL RECORD NO.:  [id number]          PATIENT TYPE:  INP   LOCATION:  1318                         FACILITY:  [facility]   PHYSICIAN:  Benito Mccreedy, M.D.DATE OF BIRTH:  06/18/1946   DATE OF ADMISSION:  12/16/2005  DATE OF DISCHARGE:                                HISTORY & PHYSICAL   CHIEF COMPLAINT:  Generalized weakness.   HISTORY OF PRESENT ILLNESS:  The patient presents with persistently  persistent and progressive generalized weakness, loss of appetite and  fatigue for about 2-3 weeks in duration.  He also has some persistent nausea  without vomiting.  No history of myalgia, fever or chills, shortness of  breath, cough, chest pain, PND, orthopnea or palpitations.  He has had some  dizziness without any vertiginous symptoms.  He has not had any headaches.  No rhinorrhea or coryza.  He denies any syncopal or presyncope episodes.  He  has not had any dysuria or frequency of micturition.  He had not had any  sputum production, hemoptysis, hematuria or hematochezia.  On account of  this, the patient went to see her Dr. Roderick Pee, who planned to refer the  patient to the ED for further evaluation.  According to the patient, he was  told that his kidney tests had become abnormal from the doctor's office.  At the emergency room, the patient was seen by Dr. Tereso Newcomer, who evaluated  the patient and felt that the patient was dehydrated, started the patient on  IV fluids and lab work had indicated elevated renal function and hospitalist  service was asked to evaluate for admission.   PAST MEDICAL HISTORY:  Positive for:  1.  History of diabetes mellitus.  2.  Hypertension.  3.  Renal calculus.  4.  He also has a history of dyspepsia.   FAMILY HISTORY:  Family history is positive for heart disease and diabetes.   SOCIAL HISTORY:  The patient does not smoke cigarettes or drink alcohol.   REVIEW OF SYSTEMS:  Review of  systems as noted above, otherwise not  significant.   PHYSICAL EXAMINATION:  VITAL SIGNS:  Blood pressure was 166/96 with a pulse  of 55; however, subsequent blood pressure came down to 131/76 with pulse of  60, respirations 20, temperature 97.8 degrees Fahrenheit and O2 saturation  of 93% on room air.  GENERAL:  The patient was not acutely ill-looking.  He was not toxic,  looking like he was comfortable.  HEENT:  Normocephalic, atraumatic.  Pupils were equal, round, reactive to  light.  Extraocular movements were intact.  Oropharynx was slightly dry  without any exudation or erythema.  NECK:  Supple.  No JVD.  LUNGS:  Clear to auscultation.  CARDIAC:  Regular.  No gallops.  No tachycardia.  ABDOMEN:  Obese.  Soft.  Bowel sounds present.  EXTREMITIES:  No cyanosis.  There was no edema.  CNS:  Exam was nonfocal.   LABORATORY DATA:  WBC count 8.8, hemoglobin 13.7, hematocrit 40.6, MCV 88.8,  platelet count 288,000.  Sodium 141, potassium 4.7, chloride 108, CO2  25,  glucose  54, BUN 44, creatinine 2.0, calcium 9.4.   IMPRESSION:  1.  Acute renal failure.  2.  Dehydration.  3.  Hypoglycemia.  4.  Diabetes mellitus.   The patient is a 65 year old gentleman with a history of hypertension and  diabetes, presenting with mainly upper gastrointestinal symptomatology.  The  patient has newly elevated renal function which is consistent with acute  renal failure.  The patient had been treated about 3 weeks ago or so for  what sounds to me like gastroenteritis in [place].  The cause of the  patient's symptoms may be the effect of his acute renal failure; the cause  of acute renal failure is unclear, but the patient is currently on ACE  inhibitors which could exacerbate this situation.  I am unsure whether this  is also affected by the patient's dehydration secondary to decreased oral  intake.  He has been hypoglycemic on admission, obviously due to decreased  oral intake as well.   PLAN:   The plan is to admit the patient for IV fluid resuscitation and  monitor his renal function.  We will hold the patient's metformin on account  of his acute renal failure.  We will also hold his other antidiabetic  medications, i.e., Avandia/glimepiride on account of his hypoglycemic  episode and monitor his fingerstick glucose.  Should his blood sugars  consistently stay up, the patient may be initiated back on Avandia and  glimepiride, followed by metformin when his renal function stabilizes to  normal levels.  The patient may well need some insulin supplementation,  which may be instituted depending on the nature of his serial __________  glucose measurements.  We will initiate renal workup with urine studies to  check his FENA as well as renal ultrasound, as well as evaluation of his  acute renal failure, though this may be relatively secondary to his  dehydration/volume depletion.  On account of the fact that we have opted to  hold his ACE inhibitors, we will start the patient on a tentative  antihypertensive.  We cannot use beta blockers here on account of his  borderline, relatively low pulse width.  The patient clinically does not  seem to have any urinary infection; however, we will go ahead and check his  urinalysis and also check his TSH for completeness-sake.  We will also check  a 12-lead EKG.  Further recommendations will be made as his data base is  finished.      Benito Mccreedy, M.D.  Electronically Signed     GO/MEDQ  D:  12/17/2005  T:  12/17/2005  Job:  [id number]   cc:   Osvaldo Human, M.D.  Fax: [phone]

## 2011-01-31 NOTE — Op Note (Signed)
NAME:  Fernando Hess, Fernando Hess                          ACCOUNT NO.:  [id number]   MEDICAL RECORD NO.:  [id number]                   PATIENT TYPE:  AMB   LOCATION:  DAY                                  [facility]ACILITY:  [facility]   PHYSICIAN:  Bernestine Amass, M.D.               DATE OF BIRTH:  01-13-1946   DATE OF PROCEDURE:  06/19/2003  DATE OF DISCHARGE:                                 OPERATIVE REPORT   PREOPERATIVE DIAGNOSIS:  Left distal ureteral calculus, 6 mm.   POSTOPERATIVE DIAGNOSIS:  Left distal ureteral calculus, 6 mm.   PROCEDURES PERFORMED:  1. Cystoscopy.  2. Retrograde pyelogram.  3. Left ureteroscopy.  4. Holmium laser lithotripsy with basketing of fragments.   SURGEON:  Bernestine Amass, M.D.   ANESTHESIA:  General.   INDICATIONS:  Mr. Leventry is a 65 year old male.  He recently came in with  some nonspecific suprapubic pain.  He had hematuria and was diagonal with a  left proximal ureteral stone.  Of note, he had a pelvic left kidney.  The  ureter was quite midline, and that was why he was having lower suprapubic  pain rather than typical flank discomfort.  We initially attempted  lithotripsy.  The stone did not appear to fracture very well.  The stone  eventually migrated to his distal ureter, and we have been observing him for  the last couple of weeks.  He has been feeling pretty good but has had no  obvious progression, and we elected to definitively manage the stone.   TECHNIQUE AND FINDINGS:  The patient was brought to the operating room where  he had successful induction of general anesthesia.  He was placed in  lithotomy position and prepped and draped in the usual manner.  Cystoscopy  revealed some mild trilobar hyperplasia.  Retrograde pyelogram confirmed the  6 mm filling defect in the distal left ureter.  A guidewire was placed  beyond the stone into the renal pelvis which again was only about 8 cm away  from the ureteral orifice.  The 6 French mini ureteroscope  was then engaged  in the left distal ureter without the need for dilation.  A 6 mm stone was  encountered.  We felt this was too large to basket extract.  We used a  holmium laser fiber to break this into 6-8 pieces.  This was a very hard  stone, and I suspect will turn out to be calcium oxylate monohydrate.  All  the fragments more than 2 mm were basket-extracted, and we removed about six  pieces.  Because there was no need for dilation and the ureteroscopy and  holmium laser lithotripsy was very atraumatic and the fact that the patient  has a pelvic kidney which would be a little bit difficult with regard to  stent sizing, we elected to leave a stent out.  The patient appeared to  tolerate the  procedure well.  There were no obvious complications.                                               Bernestine Amass, M.D.    DSG/MEDQ  D:  06/19/2003  T:  06/19/2003  Job:  [id number]

## 2011-06-12 LAB — DIFFERENTIAL
Basophils Absolute: 0
Basophils Relative: 0
Eosinophils Absolute: 0.1
Eosinophils Relative: 1
Lymphocytes Relative: 4 — ABNORMAL LOW
Lymphs Abs: 0.4 — ABNORMAL LOW
Lymphs Abs: 0.9
Monocytes Absolute: 0.5
Monocytes Relative: 11
Monocytes Relative: 4
Neutro Abs: 10.8 — ABNORMAL HIGH
Neutro Abs: 6.9
Neutrophils Relative %: 75
Neutrophils Relative %: 91 — ABNORMAL HIGH

## 2011-06-12 LAB — COMPREHENSIVE METABOLIC PANEL
ALT: 15
Albumin: 2.5 — ABNORMAL LOW
Alkaline Phosphatase: 68
BUN: 22
BUN: 23
CO2: 25
Calcium: 7.5 — ABNORMAL LOW
Chloride: 104
Creatinine, Ser: 1.3
GFR calc Af Amer: >60
GFR calc non Af Amer: 56 — ABNORMAL LOW
Glucose, Bld: 126 — ABNORMAL HIGH
Glucose, Bld: 190 — ABNORMAL HIGH
Potassium: 3.9
Sodium: 137
Sodium: 137
Total Bilirubin: 1.5 — ABNORMAL HIGH
Total Protein: 4.6 — ABNORMAL LOW

## 2011-06-12 LAB — CBC
HCT: 43.3
Hemoglobin: 11.8 — ABNORMAL LOW
Hemoglobin: 14.5
MCHC: 33.5
MCHC: 34.1
MCHC: 34.1
MCV: 86
MCV: 86.2
Platelets: 192
Platelets: 242
RBC: 4.02 — ABNORMAL LOW
RBC: 5.02
RDW: 14.9
RDW: 14.9
WBC: 11.9 — ABNORMAL HIGH

## 2011-06-12 LAB — BLOOD GAS, ARTERIAL
Acid-base deficit: 0.7
Bicarbonate: 22.7
FIO2: 0.21
O2 Saturation: 47.6
Patient temperature: 98.6
TCO2: 20.6
pCO2 arterial: 34.7 — ABNORMAL LOW
pH, Arterial: 7.431
pO2, Arterial: 26.1 — CL

## 2011-06-12 LAB — URINALYSIS, ROUTINE W REFLEX MICROSCOPIC
Glucose, UA: 100 — AB
Hgb urine dipstick: NEGATIVE
Nitrite: NEGATIVE
Protein, ur: NEGATIVE
Specific Gravity, Urine: 1.028
Urobilinogen, UA: 1
pH: 5.5

## 2011-06-12 LAB — CULTURE, BLOOD (ROUTINE X 2)
Culture: NO GROWTH
Culture: NO GROWTH

## 2011-06-12 LAB — URINE CULTURE: Colony Count: 8000

## 2011-06-12 LAB — FECAL LACTOFERRIN, QUANT: Fecal Lactoferrin: POSITIVE

## 2011-06-12 LAB — BASIC METABOLIC PANEL
BUN: 11
CO2: 24
Chloride: 115 — ABNORMAL HIGH
Creatinine, Ser: 1.15

## 2011-06-12 LAB — TROPONIN I: Troponin I: <0.01

## 2011-06-12 LAB — OVA AND PARASITE EXAMINATION

## 2011-06-12 LAB — ROCKY MTN SPOTTED FVR AB, IGM-BLOOD: RMSF IgM: 0.14 IV

## 2011-06-12 LAB — COMPREHENSIVE METABOLIC PANEL WITH GFR
ALT: 21
AST: 18
Albumin: 3.5
Calcium: 9.1
Total Protein: 6.3

## 2011-06-12 LAB — CLOSTRIDIUM DIFFICILE EIA: C difficile Toxins A+B, EIA: NEGATIVE

## 2011-06-12 LAB — ROCKY MTN SPOTTED FVR AB, IGG-BLOOD: RMSF IgG: <1:64 {titer}

## 2011-06-12 LAB — URINE MICROSCOPIC-ADD ON

## 2011-06-12 LAB — INFLUENZA A+B VIRUS AG-DIRECT(RAPID)
Inflenza A Ag: NEGATIVE
Influenza B Ag: NEGATIVE

## 2011-06-12 LAB — HEMOGLOBIN A1C: Hgb A1c MFr Bld: 7.7 — ABNORMAL HIGH

## 2011-06-12 LAB — CK TOTAL AND CKMB (NOT AT ARMC)
CK, MB: 0.6
Total CK: 30

## 2011-06-12 LAB — EHEC TOXIN BY EIA, STOOL

## 2011-06-12 LAB — LACTIC ACID, PLASMA: Lactic Acid, Venous: 1.5

## 2013-08-24 ENCOUNTER — Other Ambulatory Visit (HOSPITAL_COMMUNITY): Payer: Self-pay | Admitting: Family Medicine

## 2013-08-24 DIAGNOSIS — I639 Cerebral infarction, unspecified: Secondary | ICD-10-CM

## 2013-08-24 DIAGNOSIS — G459 Transient cerebral ischemic attack, unspecified: Secondary | ICD-10-CM

## 2013-08-24 DIAGNOSIS — G453 Amaurosis fugax: Secondary | ICD-10-CM

## 2013-08-30 ENCOUNTER — Ambulatory Visit (HOSPITAL_COMMUNITY)
Admission: RE | Admit: 2013-08-30 | Discharge: 2013-08-30 | Disposition: A | Discharge status: Home / Self Care | Payer: Medicare Other | Source: Ambulatory Visit | Attending: Family Medicine | Admitting: Family Medicine

## 2013-08-30 ENCOUNTER — Encounter (INDEPENDENT_AMBULATORY_CARE_PROVIDER_SITE_OTHER): Payer: Self-pay

## 2013-08-30 DIAGNOSIS — E119 Type 2 diabetes mellitus without complications: Secondary | ICD-10-CM | POA: Insufficient documentation

## 2013-08-30 DIAGNOSIS — H3400 Transient retinal artery occlusion, unspecified eye: Secondary | ICD-10-CM | POA: Insufficient documentation

## 2013-08-30 DIAGNOSIS — I079 Rheumatic tricuspid valve disease, unspecified: Secondary | ICD-10-CM | POA: Insufficient documentation

## 2013-08-30 DIAGNOSIS — G459 Transient cerebral ischemic attack, unspecified: Secondary | ICD-10-CM

## 2013-08-30 DIAGNOSIS — G453 Amaurosis fugax: Secondary | ICD-10-CM

## 2013-08-30 DIAGNOSIS — I10 Essential (primary) hypertension: Secondary | ICD-10-CM | POA: Insufficient documentation

## 2013-08-30 DIAGNOSIS — I359 Nonrheumatic aortic valve disorder, unspecified: Secondary | ICD-10-CM

## 2013-08-30 DIAGNOSIS — I080 Rheumatic disorders of both mitral and aortic valves: Secondary | ICD-10-CM | POA: Insufficient documentation

## 2013-08-30 DIAGNOSIS — Z8673 Personal history of transient ischemic attack (TIA), and cerebral infarction without residual deficits: Secondary | ICD-10-CM | POA: Insufficient documentation

## 2013-08-30 DIAGNOSIS — E785 Hyperlipidemia, unspecified: Secondary | ICD-10-CM | POA: Insufficient documentation

## 2013-08-30 NOTE — Progress Notes (Signed)
*  PRELIMINARY RESULTS* Vascular Ultrasound Carotid Duplex (Doppler) has been completed.   Findings suggest 1-39% internal carotid artery stenosis bilaterally. Vertebral arteries are patent with antegrade flow.  08/30/2013 9:40 AM Maudry Mayhew, RVT, RDCS, RDMS

## 2013-08-30 NOTE — Progress Notes (Signed)
[facility] Lab  2D Echocardiogram completed.  [place], RDCS 08/30/2013 8:55 AM

## 2013-09-01 ENCOUNTER — Ambulatory Visit (HOSPITAL_COMMUNITY)
Admission: RE | Admit: 2013-09-01 | Discharge: 2013-09-01 | Disposition: A | Discharge status: Home / Self Care | Payer: Medicare Other | Source: Ambulatory Visit | Attending: Family Medicine | Admitting: Family Medicine

## 2013-09-01 ENCOUNTER — Other Ambulatory Visit (HOSPITAL_COMMUNITY): Payer: Medicare Other

## 2013-09-01 ENCOUNTER — Encounter (HOSPITAL_COMMUNITY): Payer: Self-pay

## 2013-09-01 DIAGNOSIS — H5310 Unspecified subjective visual disturbances: Secondary | ICD-10-CM | POA: Insufficient documentation

## 2013-09-01 DIAGNOSIS — E785 Hyperlipidemia, unspecified: Secondary | ICD-10-CM | POA: Insufficient documentation

## 2013-09-01 DIAGNOSIS — I10 Essential (primary) hypertension: Secondary | ICD-10-CM | POA: Insufficient documentation

## 2013-09-01 DIAGNOSIS — I6789 Other cerebrovascular disease: Secondary | ICD-10-CM | POA: Insufficient documentation

## 2013-09-01 DIAGNOSIS — E119 Type 2 diabetes mellitus without complications: Secondary | ICD-10-CM | POA: Insufficient documentation

## 2013-09-01 DIAGNOSIS — G319 Degenerative disease of nervous system, unspecified: Secondary | ICD-10-CM | POA: Insufficient documentation

## 2013-09-01 DIAGNOSIS — I639 Cerebral infarction, unspecified: Secondary | ICD-10-CM

## 2013-09-02 ENCOUNTER — Other Ambulatory Visit: Payer: Self-pay | Admitting: Ophthalmology

## 2013-10-21 ENCOUNTER — Encounter: Payer: Self-pay | Admitting: Internal Medicine

## 2013-10-31 ENCOUNTER — Encounter: Payer: Self-pay | Admitting: Internal Medicine

## 2013-12-05 ENCOUNTER — Ambulatory Visit (AMBULATORY_SURGERY_CENTER): Payer: Self-pay | Admitting: *Deleted

## 2013-12-05 VITALS — Ht 66.0 in | Wt 178.4 lb

## 2013-12-05 DIAGNOSIS — Z86010 Personal history of colonic polyps: Secondary | ICD-10-CM

## 2013-12-05 MED ORDER — MOVIPREP 100 G PO SOLR: ORAL | Status: DC

## 2013-12-05 NOTE — Progress Notes (Signed)
Patient denies any allergies to eggs or soy. Patient denies any problems with anesthesia.  

## 2013-12-13 ENCOUNTER — Encounter: Payer: Self-pay | Admitting: Internal Medicine

## 2013-12-19 ENCOUNTER — Ambulatory Visit (AMBULATORY_SURGERY_CENTER): Payer: Medicare Other | Admitting: Internal Medicine

## 2013-12-19 ENCOUNTER — Encounter: Payer: Self-pay | Admitting: Internal Medicine

## 2013-12-19 VITALS — BP 107/46 | HR 56 | Temp 97.9°F | Resp 25 | Ht 66.0 in | Wt 178.0 lb

## 2013-12-19 DIAGNOSIS — D126 Benign neoplasm of colon, unspecified: Secondary | ICD-10-CM

## 2013-12-19 DIAGNOSIS — Z86010 Personal history of colonic polyps: Secondary | ICD-10-CM

## 2013-12-19 LAB — GLUCOSE, CAPILLARY
Glucose-Capillary: 274 mg/dL — ABNORMAL HIGH (ref 70–99)
Glucose-Capillary: 223 mg/dL — ABNORMAL HIGH (ref 70–99)

## 2013-12-19 MED ORDER — SODIUM CHLORIDE 0.9 % IV SOLN: 500.0000 mL | INTRAVENOUS | Status: DC

## 2013-12-19 NOTE — Progress Notes (Signed)
Procedure ends, to recovery, report given and VSS. 

## 2013-12-19 NOTE — Progress Notes (Signed)
Called to room to assist during endoscopic procedure.  Patient ID and intended procedure confirmed with present staff. Received instructions for my participation in the procedure from the performing physician.  

## 2013-12-19 NOTE — Op Note (Signed)
[place]  [address] [place], [id number]   COLONOSCOPY PROCEDURE REPORT  PATIENT: Fernando Hess, Fernando Hess  MR#: [id number] BIRTHDATE: May 01, 1946 , 67  yrs. old GENDER: Male ENDOSCOPIST: Eustace Quail, MD REFERRED [id number] Program Recall PROCEDURE DATE:  12/19/2013 PROCEDURE:   Colonoscopy with snare polypectomy x2 First Screening Colonoscopy - Avg.  risk and is 50 yrs.  old or older - No.  Prior Negative Screening - Now for repeat screening. N/A  History of Adenoma - Now for follow-up colonoscopy & has been > or = to 3 yrs.  Yes hx of adenoma.  Has been 3 or more years since last colonoscopy.  Polyps Removed Today? Yes. ASA CLASS:   Class II INDICATIONS:Patient's personal history of adenomatous colon polyps. 2005 and 2010 with tubular adenomas. MEDICATIONS: MAC sedation, administered by CRNA and propofol (Diprivan) 240mg  IV  DESCRIPTION OF PROCEDURE:   After the risks benefits and alternatives of the procedure were thoroughly explained, informed consent was obtained.  A digital rectal exam revealed no abnormalities of the rectum.   The [id number]  endoscope was introduced through the anus and advanced to the cecum, which was identified by both the appendix and ileocecal valve. No adverse events experienced.   The quality of the prep was good, using MoviPrep  The instrument was then slowly withdrawn as the colon was fully examined.  COLON FINDINGS: Two diminutive polyps were found in the transverse colon and sigmoid colon.  A polypectomy was performed with a cold snare.  The resection was complete and the polyp tissue was completely retrieved.   Mild diverticulosis was noted The finding was in the left colon.   The colon mucosa was otherwise normal. Retroflexed views revealed internal hemorrhoids. The time to cecum=2 minutes 25 seconds.  Withdrawal time=13 minutes 46 seconds. The scope was withdrawn and the procedure completed. COMPLICATIONS:  There were no complications.  ENDOSCOPIC IMPRESSION: 1.   Two diminutive polyps were found in the colon; polypectomy was performed with a cold snare 2.   Mild diverticulosis was noted in the left colon 3.   The colon mucosa was otherwise normal  RECOMMENDATIONS: 1. Follow up colonoscopy in 5 years   eSigned:  Eustace Quail, MD 12/19/2013 8:46 AM   cc: Lona Kettle A.MD and The Patient

## 2013-12-19 NOTE — Patient Instructions (Signed)
YOU HAD AN ENDOSCOPIC PROCEDURE TODAY AT [facility]: Refer to the procedure report that was given to you for any specific questions about what was found during the examination.  If the procedure report does not answer your questions, please call your gastroenterologist to clarify.  If you requested that your care partner not be given the details of your procedure findings, then the procedure report has been included in a sealed envelope for you to review at your convenience later.  YOU SHOULD EXPECT: Some feelings of bloating in the abdomen. Passage of more gas than usual.  Walking can help get rid of the air that was put into your GI tract during the procedure and reduce the bloating. If you had a lower endoscopy (such as a colonoscopy or flexible sigmoidoscopy) you may notice spotting of blood in your stool or on the toilet paper. If you underwent a bowel prep for your procedure, then you may not have a normal bowel movement for a few days.  DIET: Your first meal following the procedure should be a light meal and then it is ok to progress to your normal diet.  A half-sandwich or bowl of soup is an example of a good first meal.  Heavy or fried foods are harder to digest and may make you feel nauseous or bloated.  Likewise meals heavy in dairy and vegetables can cause extra gas to form and this can also increase the bloating.  Drink plenty of fluids but you should avoid alcoholic beverages for 24 hours.Try to increase the fiber in your diet due to your Diverticulosis.  We want to prevent Diverticulitis.  ACTIVITY: Your care partner should take you home directly after the procedure.  You should plan to take it easy, moving slowly for the rest of the day.  You can resume normal activity the day after the procedure however you should NOT DRIVE or use heavy machinery for 24 hours (because of the sedation medicines used during the test).    SYMPTOMS TO REPORT IMMEDIATELY: A gastroenterologist  can be reached at any hour.  During normal business hours, 8:30 AM to 5:00 PM Monday through Friday, call [phone].  After hours and on weekends, please call the GI answering service at [phone] who will take a message and have the physician on call contact you.   Following lower endoscopy (colonoscopy or flexible sigmoidoscopy):  Excessive amounts of blood in the stool  Significant tenderness or worsening of abdominal pains  Swelling of the abdomen that is new, acute  Fever of 100F or higher  FOLLOW UP: If any biopsies were taken you will be contacted by phone or by letter within the next 1-3 weeks.  Call your gastroenterologist if you have not heard about the biopsies in 3 weeks.  Our staff will call the home number listed on your records the next business day following your procedure to check on you and address any questions or concerns that you may have at that time regarding the information given to you following your procedure. This is a courtesy call and so if there is no answer at the home number and we have not heard from you through the emergency physician on call, we will assume that you have returned to your regular daily activities without incident.  SIGNATURES/CONFIDENTIALITY: You and/or your care partner have signed paperwork which will be entered into your electronic medical record.  These signatures attest to the fact that that the information above on your  After Visit Summary has been reviewed and is understood.  Full responsibility of the confidentiality of this discharge information lies with you and/or your care-partner.

## 2013-12-20 ENCOUNTER — Telehealth: Payer: Self-pay

## 2013-12-20 NOTE — Telephone Encounter (Signed)
  Follow up Call-  Call back number 12/19/2013  Post procedure Call Back phone  # [phone] cell  Permission to leave phone message Yes     Patient questions:  Do you have a fever, pain , or abdominal swelling? no Pain Score  0 *  Have you tolerated food without any problems? yes  Have you been able to return to your normal activities? yes  Do you have any questions about your discharge instructions: Diet   no Medications  no Follow up visit  no  Do you have questions or concerns about your Care? no  Actions: * If pain score is 4 or above: No action needed, pain <4.   No problems per the pt. Maw

## 2013-12-22 ENCOUNTER — Encounter: Payer: Self-pay | Admitting: Internal Medicine

## 2014-01-25 ENCOUNTER — Other Ambulatory Visit: Payer: Self-pay | Admitting: Dermatology

## 2014-04-11 ENCOUNTER — Ambulatory Visit
Admission: RE | Admit: 2014-04-11 | Discharge: 2014-04-11 | Disposition: A | Discharge status: Home / Self Care | Payer: Medicare Other | Source: Ambulatory Visit | Attending: Cardiology | Admitting: Cardiology

## 2014-04-11 ENCOUNTER — Other Ambulatory Visit: Payer: Self-pay | Admitting: Cardiology

## 2014-04-11 DIAGNOSIS — R079 Chest pain, unspecified: Secondary | ICD-10-CM | POA: Insufficient documentation

## 2014-04-11 DIAGNOSIS — E785 Hyperlipidemia, unspecified: Secondary | ICD-10-CM

## 2014-04-11 DIAGNOSIS — Z87442 Personal history of urinary calculi: Secondary | ICD-10-CM

## 2014-04-11 DIAGNOSIS — R0789 Other chest pain: Secondary | ICD-10-CM

## 2014-04-11 DIAGNOSIS — R9439 Abnormal result of other cardiovascular function study: Secondary | ICD-10-CM

## 2014-04-11 DIAGNOSIS — I10 Essential (primary) hypertension: Secondary | ICD-10-CM

## 2014-04-11 DIAGNOSIS — E119 Type 2 diabetes mellitus without complications: Secondary | ICD-10-CM

## 2014-04-11 NOTE — H&P (Signed)
Fernando Hess    Date of visit:  04/11/2014 DOB:  Jul 19, 1946    Age:  68 yrs. Medical record number:  [id number]     Account number:  [phone] Primary Care Provider: ROSS, C. ALAN ____________________________ CURRENT DIAGNOSES  1. Chest Pain  2. Diabetes Mellitus-NIDD  3. Hypertension,Essential (Benign) ____________________________ ALLERGIES  Levaquin, Hives and/or rash  Lisinopril, Intolerance-cough ____________________________ MEDICATIONS  1. aspirin 81 mg chewable tablet, 1 p.o. daily  2. multivitamin tablet, 1 p.o. daily  3. losartan 100 mg tablet, 1 p.o. daily  4. pantoprazole 40 mg tablet,delayed release, 1 p.o. daily  5. ondansetron HCl 8 mg tablet, 1 p.o. daily  6. Onglyza 5 mg tablet, 1 p.o. daily  7. atorvastatin 20 mg tablet, 1 p.o. daily  8. glimepiride 4 mg tablet, 1.5 p.o. daily  9. metformin 500 mg tablet, 3 p.o. b.i.d.  10. griseofulvin microsize 500 mg tablet, 1 p.o. daily  11. nitroglycerin 0.4 mg sublingual tablet, PRN  12. metformin 500 mg tablet, 2 p.o. b.i.d.  13. metoprolol succinate ER 25 mg tablet,extended release 24 hr, 1 p.o. daily  14. nitroglycerin 0.4 mg sublingual tablet, Take as directed ____________________________ CHIEF COMPLAINTS Preop cardiac evaluation ____________________________ HISTORY OF PRESENT ILLNESS This very nice 68 year old male is seen for evaluation of throat discomfort and nausea. He has a long-standing history of diabetes and hypertension but has previously been in good health. A couple of weeks ago he was cutting the grass on a riding lawnmower and had some nausea and vomiting and later became fainnt and had stopped cutting the grass. This summer he has noted some throat tightness when he would exert himself. He would then hold his throat and find that after he held his throat he can continue walking. He denied any chest pressure or tightness in his arms and denies PND or orthopnea. He normally can do his regular work without  symptoms or problems. His symptoms have only occurred with exertion and he finds that he can walk through them. He has not had any symptoms at rest. He has no claudication and other than that complained of a rash in his groin that has been treated with griseofulvin.  The patient returned for an exercise treadmill test done today the showed significant ST depression inferolateral leads into Bruce stage II accompanied with mild throat tightness. It was recommended that he undergo cardiac catheterization. ____________________________ PAST HISTORY  Past Medical Illnesses:  hypertension, DM-non-insulin dependent, hyperlipidemia, GERD, anemia-iron deficieny, erectile dysfunction, kidney stones;  Cardiovascular Illnesses:  no previous history of cardiac disease.;  Surgical Procedures:  appendectomy, kidney stone removal;  Cardiology Procedures-Invasive:  no history of prior cardiac procedures;  Cardiology Procedures-Noninvasive:  treadmill July 2015;  LVEF not documented,   ____________________________ CARDIO-PULMONARY TEST DATES EKG Date:  03/31/2014;   ____________________________ FAMILY HISTORY Brother -- Brother dead, Cancer Brother -- Brother alive with problem, Diabetes mellitus Father -- Father dead, Heart Attack Mother -- Mother dead, Heart Attack, Cancer Sister -- Son alive and well, Medical history unknown Sister -- Sister alive and well, Medical history unknown ____________________________ SOCIAL HISTORY Alcohol Use:  wine 1 q month;  Smoking:  never smoked;  Diet:  regular diet;  Lifestyle:  divorced and remarried;  Exercise:  walking;  Occupation:  retired and Air traffic controller;  Residence:  lives with wife;   ____________________________ REVIEW OF SYSTEMS General:  denies recent weight change, fatique or change in exercise tolerance.  Integumentary:rash Eyes: cataract extraction, no diabetic retinopathy Ears, Nose, Throat, Mouth:  denies any hearing loss, epistaxis, hoarseness or difficulty  speaking. Respiratory: denies dyspnea, cough, wheezing or hemoptysis. Cardiovascular:  please review HPI Abdominal: denies dyspepsia, GI bleeding, constipation, or diarrhea Genitourinary-Male: erectile dysfunction  Musculoskeletal:  denies arthritis, venous insufficiency, or muscle weakness Neurological:  denies headaches, stroke, or TIA Psychiatric:  denies depession or anxiety  ____________________________ PHYSICAL EXAMINATION VITAL SIGNS  Blood Pressure:  120/80 Sitting, Left arm, regular cuff   Pulse:  64/min. Weight:  180.00 lbs. Height:  66"BMI: 29  Constitutional:  pleasant white male in no acute distress Skin:  rash of tinea cruris in groin Head:  normocephalic, balding male hair pattern Eyes:  EOMS Intact, PERRLA, C and S clear, Funduscopic exam not done. ENT:  ears, nose and throat reveal no gross abnormalities.  Dentition good. Neck:  supple, without massess. No JVD, thyromegaly or carotid bruits. Carotid upstroke normal. Chest:  normal symmetry, clear to auscultation. Cardiac:  regular rhythm, normal S1 and S2, No S3 or S4, no murmurs, gallops or rubs detected. Abdomen:  abdomen soft,non-tender, no masses, no hepatospenomegaly, or aneurysm noted Peripheral Pulses:  the femoral,dorsalis pedis, and posterior tibial pulses are full and equal bilaterally with no bruits auscultated. Extremities & Back:  no deformities, clubbing, cyanosis, erythema or edema observed. Normal muscle strength and tone. Neurological:  no gross motor or sensory deficits noted, affect appropriate, oriented x3. ____________________________ IMPRESSIONS/PLAN  1. Recent onset of throat tightness suggestive of angina with an abnormal exercise treadmill test low level of exercise 2. Diabetes mellitus 3. Hyperlipidemia 4. Hypertension 5. Obesity  Recommendations:  In light of abnormal exercise treadmill test plan cardiac catheterization. Cardiac catheterization was discussed with the patient including risks of  myocardial infarction, death, stroke, bleeding, arrhythmia, dye allergy, or renal insufficiency. He understands and is willing to proceed. Possibility of percutaneous intervention at the same setting was also discussed with the patient including risks. ____________________________ TODAYS ORDERS  1. Chest X-ray PA/Lat: today  2. Comprehensive Metabolic Panel: Today  3. Complete Blood Count: Today  4. Draw PT/INR: Today  5. PTT: Today                       ____________________________ Cardiology Physician:  Kerry Hough MD [facility]

## 2014-04-13 ENCOUNTER — Encounter (HOSPITAL_COMMUNITY)
Admission: RE | Disposition: A | Discharge status: Home / Self Care | Payer: Self-pay | Source: Ambulatory Visit | Attending: Cardiology

## 2014-04-13 ENCOUNTER — Ambulatory Visit (HOSPITAL_COMMUNITY)
Admission: RE | Admit: 2014-04-13 | Discharge: 2014-04-13 | Disposition: A | Discharge status: Home / Self Care | Payer: Medicare Other | Source: Ambulatory Visit | Attending: Cardiology | Admitting: Cardiology

## 2014-04-13 ENCOUNTER — Encounter (HOSPITAL_COMMUNITY): Payer: Self-pay | Admitting: Cardiology

## 2014-04-13 DIAGNOSIS — Z6829 Body mass index (BMI) 29.0-29.9, adult: Secondary | ICD-10-CM | POA: Diagnosis not present

## 2014-04-13 DIAGNOSIS — I10 Essential (primary) hypertension: Secondary | ICD-10-CM | POA: Diagnosis not present

## 2014-04-13 DIAGNOSIS — K219 Gastro-esophageal reflux disease without esophagitis: Secondary | ICD-10-CM | POA: Diagnosis not present

## 2014-04-13 DIAGNOSIS — Z7982 Long term (current) use of aspirin: Secondary | ICD-10-CM | POA: Diagnosis not present

## 2014-04-13 DIAGNOSIS — D509 Iron deficiency anemia, unspecified: Secondary | ICD-10-CM | POA: Insufficient documentation

## 2014-04-13 DIAGNOSIS — N529 Male erectile dysfunction, unspecified: Secondary | ICD-10-CM | POA: Insufficient documentation

## 2014-04-13 DIAGNOSIS — E785 Hyperlipidemia, unspecified: Secondary | ICD-10-CM | POA: Insufficient documentation

## 2014-04-13 DIAGNOSIS — I2510 Atherosclerotic heart disease of native coronary artery without angina pectoris: Secondary | ICD-10-CM | POA: Insufficient documentation

## 2014-04-13 DIAGNOSIS — R0789 Other chest pain: Secondary | ICD-10-CM

## 2014-04-13 DIAGNOSIS — E669 Obesity, unspecified: Secondary | ICD-10-CM | POA: Diagnosis not present

## 2014-04-13 DIAGNOSIS — Z8249 Family history of ischemic heart disease and other diseases of the circulatory system: Secondary | ICD-10-CM | POA: Insufficient documentation

## 2014-04-13 DIAGNOSIS — E119 Type 2 diabetes mellitus without complications: Secondary | ICD-10-CM | POA: Diagnosis not present

## 2014-04-13 HISTORY — PX: LEFT HEART CATHETERIZATION WITH CORONARY ANGIOGRAM: SHX5451

## 2014-04-13 LAB — CBC
HEMATOCRIT: 36 % — AB (ref 39.0–52.0)
HEMOGLOBIN: 11.4 g/dL — AB (ref 13.0–17.0)
MCH: 25.9 pg — AB (ref 26.0–34.0)
MCHC: 31.7 g/dL (ref 30.0–36.0)
MCV: 81.8 fL (ref 78.0–100.0)
Platelets: 193 10*3/uL (ref 150–400)
RBC: 4.4 MIL/uL (ref 4.22–5.81)
RDW: 15.7 % — ABNORMAL HIGH (ref 11.5–15.5)
WBC: 8.7 10*3/uL (ref 4.0–10.5)

## 2014-04-13 LAB — BASIC METABOLIC PANEL
Anion gap: 11 (ref 5–15)
BUN: 17 mg/dL (ref 6–23)
CO2: 25 mEq/L (ref 19–32)
Calcium: 9 mg/dL (ref 8.4–10.5)
Chloride: 107 mEq/L (ref 96–112)
Creatinine, Ser: 1.13 mg/dL (ref 0.50–1.35)
GFR calc Af Amer: 74 mL/min — ABNORMAL LOW (ref >90)
GFR, EST NON AFRICAN AMERICAN: 64 mL/min — AB (ref >90)
GLUCOSE: 182 mg/dL — AB (ref 70–99)
POTASSIUM: 4.8 meq/L (ref 3.7–5.3)
Sodium: 143 mEq/L (ref 137–147)

## 2014-04-13 LAB — GLUCOSE, CAPILLARY
GLUCOSE-CAPILLARY: 146 mg/dL — AB (ref 70–99)
Glucose-Capillary: 155 mg/dL — ABNORMAL HIGH (ref 70–99)

## 2014-04-13 LAB — PROTIME-INR
INR: 1.09 (ref 0.00–1.49)
Prothrombin Time: 14.1 seconds (ref 11.6–15.2)

## 2014-04-13 SURGERY — LEFT HEART CATHETERIZATION WITH CORONARY ANGIOGRAM
Anesthesia: LOCAL

## 2014-04-13 MED ORDER — METFORMIN HCL 500 MG PO TABS: 1000.0000 mg | ORAL_TABLET | Freq: Two times a day (BID) | ORAL | Status: DC

## 2014-04-13 MED ORDER — HEPARIN (PORCINE) IN NACL 2-0.9 UNIT/ML-% IJ SOLN
INTRAMUSCULAR | Status: AC
  Filled 2014-04-13: qty 1500

## 2014-04-13 MED ORDER — MIDAZOLAM HCL 2 MG/2ML IJ SOLN
INTRAMUSCULAR | Status: AC
  Filled 2014-04-13: qty 2

## 2014-04-13 MED ORDER — FENTANYL CITRATE 0.05 MG/ML IJ SOLN
INTRAMUSCULAR | Status: AC
  Filled 2014-04-13: qty 2

## 2014-04-13 MED ORDER — SODIUM CHLORIDE 0.9 % IV SOLN
INTRAVENOUS | Status: DC
Start: 2014-04-14 — End: 2014-04-13
  Administered 2014-04-13: 07:00:00 via INTRAVENOUS

## 2014-04-13 MED ORDER — SODIUM CHLORIDE 0.9 % IV SOLN: 250.0000 mL | INTRAVENOUS | Status: DC | PRN

## 2014-04-13 MED ORDER — SODIUM CHLORIDE 0.9 % IJ SOLN: 3.0000 mL | INTRAMUSCULAR | Status: DC | PRN

## 2014-04-13 MED ORDER — LIDOCAINE HCL (PF) 1 % IJ SOLN
INTRAMUSCULAR | Status: AC
  Filled 2014-04-13: qty 30

## 2014-04-13 MED ORDER — SODIUM CHLORIDE 0.9 % IV SOLN: 1.0000 mL/kg/h | INTRAVENOUS | Status: DC

## 2014-04-13 MED ORDER — ASPIRIN 81 MG PO CHEW
81.0000 mg | CHEWABLE_TABLET | ORAL | Status: AC
  Administered 2014-04-13: 81 mg via ORAL

## 2014-04-13 MED ORDER — SODIUM CHLORIDE 0.9 % IJ SOLN: 3.0000 mL | Freq: Two times a day (BID) | INTRAMUSCULAR | Status: DC

## 2014-04-13 MED ORDER — ASPIRIN 81 MG PO CHEW
CHEWABLE_TABLET | ORAL | Status: AC
  Filled 2014-04-13: qty 1

## 2014-04-13 NOTE — Interval H&P Note (Signed)
History and Physical Interval Note:  04/13/2014 7:38 AM   Patient seen and examined.  No interval change in history and exam since last note.  Stable for procedure.  Kerry Hough. MD [facility]  04/13/2014

## 2014-04-13 NOTE — Progress Notes (Signed)
Order for sheath removal verified per post procedural orders. Procedure explained to patient and Rt femoral artery access site assessed: level 0, palpable dorsalis pedis and posterior tibial pulse was 2+. 5 [place] Sheath removed and manual pressure applied for 20 minutes. Pre, peri, & post procedural vitals: HR 56 , RR 22, O2 Sat 96% RA, BP 140/85, Pain 0. Distal pulses remained intact after sheath removal. Access site level 0 and dressed with 4X4 gauze and tegaderm.  Lattie Haw RN confirmed condition of site. Post procedural instructions discussed with return demonstration from patient.

## 2014-04-13 NOTE — H&P (View-Only) (Signed)
Fernando Hess    Date of visit:  04/11/2014 DOB:  Mar 26, 1946    Age:  68 yrs. Medical record number:  [id number]     Account number:  [phone] Primary Care Provider: ROSS, C. ALAN ____________________________ CURRENT DIAGNOSES  1. Chest Pain  2. Diabetes Mellitus-NIDD  3. Hypertension,Essential (Benign) ____________________________ ALLERGIES  Levaquin, Hives and/or rash  Lisinopril, Intolerance-cough ____________________________ MEDICATIONS  1. aspirin 81 mg chewable tablet, 1 p.o. daily  2. multivitamin tablet, 1 p.o. daily  3. losartan 100 mg tablet, 1 p.o. daily  4. pantoprazole 40 mg tablet,delayed release, 1 p.o. daily  5. ondansetron HCl 8 mg tablet, 1 p.o. daily  6. Onglyza 5 mg tablet, 1 p.o. daily  7. atorvastatin 20 mg tablet, 1 p.o. daily  8. glimepiride 4 mg tablet, 1.5 p.o. daily  9. metformin 500 mg tablet, 3 p.o. b.i.d.  10. griseofulvin microsize 500 mg tablet, 1 p.o. daily  11. nitroglycerin 0.4 mg sublingual tablet, PRN  12. metformin 500 mg tablet, 2 p.o. b.i.d.  13. metoprolol succinate ER 25 mg tablet,extended release 24 hr, 1 p.o. daily  14. nitroglycerin 0.4 mg sublingual tablet, Take as directed ____________________________ CHIEF COMPLAINTS Preop cardiac evaluation ____________________________ HISTORY OF PRESENT ILLNESS This very nice 68 year old male is seen for evaluation of throat discomfort and nausea. He has a long-standing history of diabetes and hypertension but has previously been in good health. A couple of weeks ago he was cutting the grass on a riding lawnmower and had some nausea and vomiting and later became fainnt and had stopped cutting the grass. This summer he has noted some throat tightness when he would exert himself. He would then hold his throat and find that after he held his throat he can continue walking. He denied any chest pressure or tightness in his arms and denies PND or orthopnea. He normally can do his regular work without  symptoms or problems. His symptoms have only occurred with exertion and he finds that he can walk through them. He has not had any symptoms at rest. He has no claudication and other than that complained of a rash in his groin that has been treated with griseofulvin.  The patient returned for an exercise treadmill test done today the showed significant ST depression inferolateral leads into Bruce stage II accompanied with mild throat tightness. It was recommended that he undergo cardiac catheterization. ____________________________ PAST HISTORY  Past Medical Illnesses:  hypertension, DM-non-insulin dependent, hyperlipidemia, GERD, anemia-iron deficieny, erectile dysfunction, kidney stones;  Cardiovascular Illnesses:  no previous history of cardiac disease.;  Surgical Procedures:  appendectomy, kidney stone removal;  Cardiology Procedures-Invasive:  no history of prior cardiac procedures;  Cardiology Procedures-Noninvasive:  treadmill July 2015;  LVEF not documented,   ____________________________ CARDIO-PULMONARY TEST DATES EKG Date:  03/31/2014;   ____________________________ FAMILY HISTORY Brother -- Brother dead, Cancer Brother -- Brother alive with problem, Diabetes mellitus Father -- Father dead, Heart Attack Mother -- Mother dead, Heart Attack, Cancer Sister -- Son alive and well, Medical history unknown Sister -- Sister alive and well, Medical history unknown ____________________________ SOCIAL HISTORY Alcohol Use:  wine 1 q month;  Smoking:  never smoked;  Diet:  regular diet;  Lifestyle:  divorced and remarried;  Exercise:  walking;  Occupation:  retired and Air traffic controller;  Residence:  lives with wife;   ____________________________ REVIEW OF SYSTEMS General:  denies recent weight change, fatique or change in exercise tolerance.  Integumentary:rash Eyes: cataract extraction, no diabetic retinopathy Ears, Nose, Throat, Mouth:  denies any hearing loss, epistaxis, hoarseness or difficulty  speaking. Respiratory: denies dyspnea, cough, wheezing or hemoptysis. Cardiovascular:  please review HPI Abdominal: denies dyspepsia, GI bleeding, constipation, or diarrhea Genitourinary-Male: erectile dysfunction  Musculoskeletal:  denies arthritis, venous insufficiency, or muscle weakness Neurological:  denies headaches, stroke, or TIA Psychiatric:  denies depession or anxiety  ____________________________ PHYSICAL EXAMINATION VITAL SIGNS  Blood Pressure:  120/80 Sitting, Left arm, regular cuff   Pulse:  64/min. Weight:  180.00 lbs. Height:  66"BMI: 29  Constitutional:  pleasant white male in no acute distress Skin:  rash of tinea cruris in groin Head:  normocephalic, balding male hair pattern Eyes:  EOMS Intact, PERRLA, C and S clear, Funduscopic exam not done. ENT:  ears, nose and throat reveal no gross abnormalities.  Dentition good. Neck:  supple, without massess. No JVD, thyromegaly or carotid bruits. Carotid upstroke normal. Chest:  normal symmetry, clear to auscultation. Cardiac:  regular rhythm, normal S1 and S2, No S3 or S4, no murmurs, gallops or rubs detected. Abdomen:  abdomen soft,non-tender, no masses, no hepatospenomegaly, or aneurysm noted Peripheral Pulses:  the femoral,dorsalis pedis, and posterior tibial pulses are full and equal bilaterally with no bruits auscultated. Extremities & Back:  no deformities, clubbing, cyanosis, erythema or edema observed. Normal muscle strength and tone. Neurological:  no gross motor or sensory deficits noted, affect appropriate, oriented x3. ____________________________ IMPRESSIONS/PLAN  1. Recent onset of throat tightness suggestive of angina with an abnormal exercise treadmill test low level of exercise 2. Diabetes mellitus 3. Hyperlipidemia 4. Hypertension 5. Obesity  Recommendations:  In light of abnormal exercise treadmill test plan cardiac catheterization. Cardiac catheterization was discussed with the patient including risks of  myocardial infarction, death, stroke, bleeding, arrhythmia, dye allergy, or renal insufficiency. He understands and is willing to proceed. Possibility of percutaneous intervention at the same setting was also discussed with the patient including risks. ____________________________ TODAYS ORDERS  1. Chest X-ray PA/Lat: today  2. Comprehensive Metabolic Panel: Today  3. Complete Blood Count: Today  4. Draw PT/INR: Today  5. PTT: Today                       ____________________________ Cardiology Physician:  Kerry Hough MD [facility]

## 2014-04-13 NOTE — Discharge Instructions (Signed)
Heart Catheterization Home Care ° ° ° °A catheter was placed through the blood vessel in your groin, contrast was injected into the vessels, and pictures were taken. ° ° You may feel some discomfort at the insertion site after the local anesthetic wears off. This discomfort should gradually improve over the next several days. °· Only take over-the-counter or prescription medicines for pain, discomfort, or fever as directed by your caregiver.  °· Complications are very uncommon after this procedure.Call my office if you develop any of the following symptoms:  °· Worsening pain.  °· Bleeding.  °· Severe swelling at the puncture site.  °· Lightheadedness.  °· Dizziness or fainting.  °· Fever or chills.  °· If oozing, bleeding, or a lump appears at the puncture site, apply firm pressure directly to the site steadily for 15 minutes and go to the emergency department.  °· Keep the skin around the insertion site dry. You may take showers after 24 hours. If the area does get wet, dry the skin completely. Avoid baths until the skin puncture site heals, usually 5 to 7 days.  °· Rest  for the remainder of the day and avoid any heavy lifting (more than 10 pounds or 4.5 kg). Do not operate heavy machinery, drive, or make legal decisions for the first 24 hours after the procedure. Have a responsible person drive you home.  °· You may resume your usual diet after the procedure. Avoid alcoholic beverages for 24 hours after the procedure.  ° °

## 2014-04-13 NOTE — CV Procedure (Signed)
Cardiac Catheterization Report   Fernando Hess    68 y.o.  male  DOB: Jan 30, 1946  MRN: [id number]  04/13/2014    PROCEDURE:  Left heart catheterization with selective coronary angiography, left ventriculogram.  INDICATIONS: Exertional throat tightness with an abnormal treadmill test and a diabetic  The risks, benefits, and details of the procedure were explained to the patient.  The patient verbalized understanding and wanted to proceed.  Informed written consent was obtained.  PROCEDURE TECHNIQUE:  After Xylocaine anesthesia a 28F sheath was placed in the right femoral artery with a single anterior needle wall stick.   Left coronary angiography was done using a Judkins L4 guide catheter.  Right coronary angiography was done using a Judkins R4 guide catheter.  A 30 cc ventriculogram was performed in the 30 degree RAO projection.  Tolerated the procedure well.  Sheath removed in the holding area.   CONTRAST:  Total of 60 cc.  COMPLICATIONS:  None.    HEMODYNAMICS:  Aortic postcontrast 140/67 LV postcontrast 140/6013  There was no gradient between the left ventricle and aorta.    ANGIOGRAPHIC DATA:    CORONARY ARTERIES:   Arise and distribute normally.  Right dominant. Mild coronary calcification is noted. Coronaries appear somewhat small in caliber  Left main coronary artery: Short but no significant stenoses  Left anterior descending: Somewhat small and diffusely narrowed. There is 50-60% stenosis the mid to distal LAD which is segmental  Circumflex coronary artery: Intermediate branch has a 60% stenosis and is somewhat small.  Right coronary artery: Large conus branch is noted. There diffuse irregularities and mild 40% distal right coronary stenosis, there is a segmental 40-50% stenosis in the PDA which is eccentric  LEFT VENTRICULOGRAM:  Performed in the 30 RAO projection.  The aortic and mitral valves are normal. The left ventricle is normal  in size with normal wall motion. Estimated ejection fraction is 60%.  IMPRESSIONS:  1. Moderate diffuse 2 vessel coronary artery disease and somewhat small coronary arteries 2. Normal left ventricle.[name]  RECOMMENDATION:  Initial medical therapy. At first glance the lesions do not appear to be suitable for PCI however will have interventional cardiology review. Discharge later today.  Kerry Hough MD [facility]

## 2014-04-13 NOTE — Progress Notes (Signed)
Spoke with pts wife by phone, discussing CRPII. They are interested in program and will send referral to [facility].  Yves Dill CES, ACSM 2:45 PM 04/13/2014

## 2014-04-27 ENCOUNTER — Encounter (HOSPITAL_COMMUNITY)
Admission: RE | Admit: 2014-04-27 | Discharge: 2014-04-27 | Disposition: A | Discharge status: Home / Self Care | Payer: Medicare Other | Source: Ambulatory Visit | Attending: Cardiology | Admitting: Cardiology

## 2014-04-27 DIAGNOSIS — R0789 Other chest pain: Secondary | ICD-10-CM | POA: Insufficient documentation

## 2014-04-27 NOTE — Progress Notes (Signed)
Cardiac Rehab Medication Review by a Pharmacist  Does the patient  feel that his/her medications are working for him/her?  yes  Has the patient been experiencing any side effects to the medications prescribed?  no  Does the patient measure his/her blood glucose at home?  yes   Does the patient have any problems obtaining medications due to transportation or finances?   no  Understanding of regimen: good Understanding of indications: good Potential of compliance: good  Pharmacist comments: Pt reports taking his medications as prescribed. Recently added metoprolol to his regimen. No other problems noted.  Reginia Naas 04/27/2014 8:07 AM

## 2014-05-01 ENCOUNTER — Encounter (HOSPITAL_COMMUNITY): Admission: RE | Admit: 2014-05-01 | Payer: Medicare Other | Source: Ambulatory Visit

## 2014-05-01 ENCOUNTER — Encounter (HOSPITAL_COMMUNITY)
Admission: RE | Admit: 2014-05-01 | Discharge: 2014-05-01 | Disposition: A | Discharge status: Home / Self Care | Payer: Medicare Other | Source: Ambulatory Visit | Attending: Cardiology | Admitting: Cardiology

## 2014-05-01 DIAGNOSIS — R0789 Other chest pain: Secondary | ICD-10-CM | POA: Diagnosis present

## 2014-05-01 LAB — GLUCOSE, CAPILLARY
Glucose-Capillary: 179 mg/dL — ABNORMAL HIGH (ref 70–99)
Glucose-Capillary: 210 mg/dL — ABNORMAL HIGH (ref 70–99)

## 2014-05-01 NOTE — Progress Notes (Addendum)
Pt started cardiac rehab today.  Pt tolerated light exercise without difficulty.   VSS, telemetry-sinus rhythm, frequent PAC.  Asymptomatic.   PHQ-0.  Pt reports loss of brother and friend in the last few months, however exhibits normal grief pattern.  Pt has good coping skills, positive outlook and supportive family.  Pt enjoys walking.  Pt rehab goals are to increase energy, stamina, lose weight and resume usual walking routine.  Pt oriented to exercise equipment and routine.  Proper NTG use reviewed with patient using teachback.  Understanding verbalized.

## 2014-05-03 ENCOUNTER — Encounter (HOSPITAL_COMMUNITY): Payer: Medicare Other

## 2014-05-03 ENCOUNTER — Encounter (HOSPITAL_COMMUNITY)
Admission: RE | Admit: 2014-05-03 | Discharge: 2014-05-03 | Disposition: A | Discharge status: Home / Self Care | Payer: Medicare Other | Source: Ambulatory Visit | Attending: Cardiology | Admitting: Cardiology

## 2014-05-03 DIAGNOSIS — R0789 Other chest pain: Secondary | ICD-10-CM | POA: Diagnosis not present

## 2014-05-03 LAB — GLUCOSE, CAPILLARY
GLUCOSE-CAPILLARY: 229 mg/dL — AB (ref 70–99)
GLUCOSE-CAPILLARY: 196 mg/dL — AB (ref 70–99)

## 2014-05-05 ENCOUNTER — Encounter (HOSPITAL_COMMUNITY): Payer: Medicare Other

## 2014-05-05 ENCOUNTER — Encounter (HOSPITAL_COMMUNITY)
Admission: RE | Admit: 2014-05-05 | Discharge: 2014-05-05 | Disposition: A | Discharge status: Home / Self Care | Payer: Medicare Other | Source: Ambulatory Visit | Attending: Cardiology | Admitting: Cardiology

## 2014-05-05 DIAGNOSIS — R0789 Other chest pain: Secondary | ICD-10-CM | POA: Diagnosis not present

## 2014-05-05 LAB — GLUCOSE, CAPILLARY: GLUCOSE-CAPILLARY: 301 mg/dL — AB (ref 70–99)

## 2014-05-05 NOTE — Progress Notes (Signed)
Fernando Hess 68 y.o. male Nutrition Note Spoke with pt.  Nutrition Plan and Nutrition Survey goals reviewed with pt. Pt is following Step 1 of the Therapeutic Lifestyle Changes diet. Pt encouraged to change from whole milk to low-fat milk, decrease consumption of fried foods, biscuits, chips, white bread, and switch to low-fat salad dressing. Barriers to dietary changes include pt's wife's food selections. Pt states "my wife switched [place] to whole milk because she said 2% milk was just as bad." Rational for choosing low-fat foods and whole grains discussed. Pt wants to lose wt. Pt has been trying to lose wt by "exercising more and eating less." Wt loss tips reviewed briefly.  Pt is diabetic. Pt reports his last A1c was 8.5 "which was down from 9." Pt checks his CBG's MWF am. Fasting CBG's reportedly 153-219 mg/dL. Pt did not check his CBG this morning "because I knew you'd check it here." Pt pre-exercise CBG 301 mg/dL after eating white bread with peanut butter and banana. Per discussion, pt is under a considerable amount of stress taking his wife to MD appointments at [facility] and starting rehab. Pt reports "long days and not a lot of help at home." Pt denies missing medication dosage. Rational for medications ordered discussed. Pt concerned/anxious about possibility of needing insulin "maybe soon according to my doctor." Insulin discussed. This Probation officer went over Diabetes Education test results. Pt expressed understanding of the information reviewed. Pt aware of nutrition education classes offered and plans on attending nutrition classes.  Nutrition Diagnosis   Food-and nutrition-related knowledge deficit related to lack of exposure to information as related to diagnosis of: ? CVD ? DM   Overweight related to excessive energy intake as evidenced by a BMI of 29.2  Nutrition RX/ Estimated Daily Nutrition Needs for: wt loss  1300-1800 Kcal, 35-45 gm fat, 8-12 gm sat fat, 1.2-1.8 gm trans-fat, <1500 mg sodium,  175-250 gm CHO   Nutrition Intervention   Pt's individual nutrition plan reviewed with pt.   Benefits of adopting Therapeutic Lifestyle Changes discussed when Medficts reviewed.   Pt to attend the Portion Distortion class   Pt to attend the  ? Nutrition I class                     ? Nutrition II class        ? Diabetes Blitz class       ? Diabetes Q & A class   Continue client-centered nutrition education by RD, as part of interdisciplinary care. Goal(s)   Pt to identify and limit food sources of saturated fat, trans fat, and cholesterol   Pt to identify food quantities necessary to achieve: ? wt loss to a goal wt of 155-173 lb (70.5-78.7 kg) at graduation from cardiac rehab.    CBG concentrations in the normal range or as close to normal as is safely possible. Monitor and Evaluate progress toward nutrition goal with team. Derek Mound, M.Ed, RD, LDN, CDE 05/05/2014 10:07 AM

## 2014-05-05 NOTE — Progress Notes (Signed)
Pt arrived at cardiac rehab with CBG-301. Pt did not exercise.   Pt admits to eating cracker jacks last night.  Pt reports he took all medications as prescribed.  Pt asymptomatic. Urine negative for ketones.  Pt met with Derek Mound, RD, CDE for diabetes education.  Pt instructed to increase PO fluid intake today and avoid aerobic exercise today. Understanding verbalized

## 2014-05-08 ENCOUNTER — Encounter (HOSPITAL_COMMUNITY)
Admission: RE | Admit: 2014-05-08 | Discharge: 2014-05-08 | Disposition: A | Discharge status: Home / Self Care | Payer: Medicare Other | Source: Ambulatory Visit | Attending: Cardiology | Admitting: Cardiology

## 2014-05-08 ENCOUNTER — Encounter (HOSPITAL_COMMUNITY): Payer: Medicare Other

## 2014-05-08 DIAGNOSIS — R0789 Other chest pain: Secondary | ICD-10-CM | POA: Diagnosis not present

## 2014-05-08 LAB — GLUCOSE, CAPILLARY: GLUCOSE-CAPILLARY: 294 mg/dL — AB (ref 70–99)

## 2014-05-10 ENCOUNTER — Encounter (HOSPITAL_COMMUNITY): Payer: Medicare Other

## 2014-05-10 ENCOUNTER — Encounter (HOSPITAL_COMMUNITY)
Admission: RE | Admit: 2014-05-10 | Discharge: 2014-05-10 | Disposition: A | Discharge status: Home / Self Care | Payer: Medicare Other | Source: Ambulatory Visit | Attending: Cardiology | Admitting: Cardiology

## 2014-05-10 DIAGNOSIS — R0789 Other chest pain: Secondary | ICD-10-CM | POA: Diagnosis not present

## 2014-05-10 LAB — GLUCOSE, CAPILLARY: GLUCOSE-CAPILLARY: 256 mg/dL — AB (ref 70–99)

## 2014-05-12 ENCOUNTER — Encounter (HOSPITAL_COMMUNITY): Payer: Medicare Other

## 2014-05-12 ENCOUNTER — Encounter (HOSPITAL_COMMUNITY)
Admission: RE | Admit: 2014-05-12 | Discharge: 2014-05-12 | Disposition: A | Discharge status: Home / Self Care | Payer: Medicare Other | Source: Ambulatory Visit | Attending: Cardiology | Admitting: Cardiology

## 2014-05-12 DIAGNOSIS — R0789 Other chest pain: Secondary | ICD-10-CM | POA: Diagnosis not present

## 2014-05-12 NOTE — Progress Notes (Signed)
Reviewed home exercise guidelines with patient including endpoints, temperature precautions, target heart rate and rate of perceived exertion. Pt is walking as his mode of home exercise. Pt voices understanding of instructions given. Sol Passer, MS, ACSM CCEP

## 2014-05-15 ENCOUNTER — Encounter (HOSPITAL_COMMUNITY)
Admission: RE | Admit: 2014-05-15 | Discharge: 2014-05-15 | Disposition: A | Discharge status: Home / Self Care | Payer: Medicare Other | Source: Ambulatory Visit | Attending: Cardiology | Admitting: Cardiology

## 2014-05-15 ENCOUNTER — Encounter (HOSPITAL_COMMUNITY): Payer: Medicare Other

## 2014-05-15 DIAGNOSIS — R0789 Other chest pain: Secondary | ICD-10-CM | POA: Diagnosis not present

## 2014-05-17 ENCOUNTER — Encounter (HOSPITAL_COMMUNITY): Payer: Medicare Other

## 2014-05-17 ENCOUNTER — Encounter (HOSPITAL_COMMUNITY)
Admission: RE | Admit: 2014-05-17 | Discharge: 2014-05-17 | Disposition: A | Discharge status: Home / Self Care | Payer: Medicare Other | Source: Ambulatory Visit | Attending: Cardiology | Admitting: Cardiology

## 2014-05-17 DIAGNOSIS — I209 Angina pectoris, unspecified: Secondary | ICD-10-CM | POA: Insufficient documentation

## 2014-05-17 NOTE — Progress Notes (Signed)
Quality of life reviewed with pt.  Pt overall, health/functioning and family scores are marginally low. Upon discussing with pt he has long term stress from marital strain. Pt exhibits stress and anxiety associated with.  Pt reports he and his wife have previously participated in counseling that was not beneficial for him.  Pt is interested in talking to [facility] with possible referral to professional counselor.  appt scheduled for Sept 16, 2015 @9 :45am.

## 2014-05-19 ENCOUNTER — Encounter (HOSPITAL_COMMUNITY): Payer: Medicare Other

## 2014-05-22 ENCOUNTER — Encounter (HOSPITAL_COMMUNITY): Payer: Medicare Other

## 2014-05-24 ENCOUNTER — Encounter (HOSPITAL_COMMUNITY): Payer: Medicare Other

## 2014-05-26 ENCOUNTER — Encounter (HOSPITAL_COMMUNITY): Payer: Medicare Other

## 2014-05-29 ENCOUNTER — Encounter (HOSPITAL_COMMUNITY): Payer: Medicare Other

## 2014-05-29 ENCOUNTER — Encounter (HOSPITAL_COMMUNITY)
Admission: RE | Admit: 2014-05-29 | Discharge: 2014-05-29 | Disposition: A | Discharge status: Home / Self Care | Payer: Medicare Other | Source: Ambulatory Visit | Attending: Cardiology | Admitting: Cardiology

## 2014-05-29 DIAGNOSIS — I209 Angina pectoris, unspecified: Secondary | ICD-10-CM | POA: Diagnosis not present

## 2014-05-31 ENCOUNTER — Encounter (HOSPITAL_COMMUNITY): Payer: Medicare Other

## 2014-05-31 ENCOUNTER — Encounter (HOSPITAL_COMMUNITY)
Admission: RE | Admit: 2014-05-31 | Discharge: 2014-05-31 | Disposition: A | Discharge status: Home / Self Care | Payer: Medicare Other | Source: Ambulatory Visit | Attending: Cardiology | Admitting: Cardiology

## 2014-05-31 DIAGNOSIS — I209 Angina pectoris, unspecified: Secondary | ICD-10-CM | POA: Diagnosis not present

## 2014-06-02 ENCOUNTER — Encounter (HOSPITAL_COMMUNITY): Payer: Medicare Other

## 2014-06-02 ENCOUNTER — Encounter (HOSPITAL_COMMUNITY)
Admission: RE | Admit: 2014-06-02 | Discharge: 2014-06-02 | Disposition: A | Discharge status: Home / Self Care | Payer: Medicare Other | Source: Ambulatory Visit | Attending: Cardiology | Admitting: Cardiology

## 2014-06-02 DIAGNOSIS — I209 Angina pectoris, unspecified: Secondary | ICD-10-CM | POA: Diagnosis not present

## 2014-06-05 ENCOUNTER — Encounter (HOSPITAL_COMMUNITY)
Admission: RE | Admit: 2014-06-05 | Discharge: 2014-06-05 | Disposition: A | Discharge status: Home / Self Care | Payer: Medicare Other | Source: Ambulatory Visit | Attending: Cardiology | Admitting: Cardiology

## 2014-06-05 ENCOUNTER — Encounter (HOSPITAL_COMMUNITY): Payer: Medicare Other

## 2014-06-05 DIAGNOSIS — I209 Angina pectoris, unspecified: Secondary | ICD-10-CM | POA: Diagnosis not present

## 2014-06-07 ENCOUNTER — Encounter (HOSPITAL_COMMUNITY)
Admission: RE | Admit: 2014-06-07 | Discharge: 2014-06-07 | Disposition: A | Discharge status: Home / Self Care | Payer: Medicare Other | Source: Ambulatory Visit | Attending: Cardiology | Admitting: Cardiology

## 2014-06-07 ENCOUNTER — Encounter (HOSPITAL_COMMUNITY): Payer: Medicare Other

## 2014-06-07 DIAGNOSIS — I209 Angina pectoris, unspecified: Secondary | ICD-10-CM | POA: Diagnosis not present

## 2014-06-09 ENCOUNTER — Encounter (HOSPITAL_COMMUNITY): Payer: Medicare Other

## 2014-06-09 ENCOUNTER — Encounter (HOSPITAL_COMMUNITY)
Admission: RE | Admit: 2014-06-09 | Discharge: 2014-06-09 | Disposition: A | Discharge status: Home / Self Care | Payer: Medicare Other | Source: Ambulatory Visit | Attending: Cardiology | Admitting: Cardiology

## 2014-06-09 DIAGNOSIS — I209 Angina pectoris, unspecified: Secondary | ICD-10-CM | POA: Diagnosis not present

## 2014-06-09 LAB — GLUCOSE, CAPILLARY: Glucose-Capillary: 187 mg/dL — ABNORMAL HIGH (ref 70–99)

## 2014-06-09 NOTE — Progress Notes (Signed)
Pt fell at cardiac rehab following stretching activity.  Pt states he bent over to pick up mat and upon arising to step away he lost his footing. Pt feels like his feet got tangled up.  An eyewitness reported the same scenerio.  Pt hit his back on nustep, no open wounds, abrasions or fractures present.  There is an area of erythema with minimal tenderness to touch.  Ice pack applied to area. Pt denies hitting his head with fall and no eyewitness account of pt hitting his head.  Pt initially c/o mild dizziness, headache and neck pain which subsided with position change.  Pt assisted to wheelchair and transported to treatment room for further assessment.  Lungs clear, no pain on inspiration or exhalation, heart-regular rate and rhythm. PERRLA, equal normal upper and lower  extremity strength and movement bilaterally.     Pt denies chest pain or dyspnea, headache and dizziness resolved.  Back on tender to touch.    Pt placed on Zoll monitor, NSR with occasional PAC.  BP:  170/90 initially, recheck:  150/80.   Orthostatic BP:  156/83, HR-60 lying, 134/57, HR-65 sitting, 134/77, HR-75 standing.  Pt asymptomatic with orthostatic BP assessment.  CBG-189.  Dr. [name] made aware of pt fall, vital signs, dizziness, mild headache initially however now pt symptom free.  Per Dr. [name] cleared pt to return home without further evaluation.  Pt offered ED evaluation which pt declined.  Pt advised to present to ED if sudden severe symptoms occur. Pt also reports he did heavy yard work yesterday with very little PO fluid intake.  Pt given gatorade. Pt also instructed to increase PO fluid intake.  Understanding verbalized.

## 2014-06-12 ENCOUNTER — Encounter (HOSPITAL_COMMUNITY): Payer: Medicare Other

## 2014-06-12 ENCOUNTER — Encounter (HOSPITAL_COMMUNITY)
Admission: RE | Admit: 2014-06-12 | Discharge: 2014-06-12 | Disposition: A | Discharge status: Home / Self Care | Payer: Medicare Other | Source: Ambulatory Visit | Attending: Cardiology | Admitting: Cardiology

## 2014-06-12 DIAGNOSIS — I209 Angina pectoris, unspecified: Secondary | ICD-10-CM | POA: Diagnosis not present

## 2014-06-14 ENCOUNTER — Encounter (HOSPITAL_COMMUNITY): Payer: Medicare Other

## 2014-06-14 ENCOUNTER — Encounter (HOSPITAL_COMMUNITY)
Admission: RE | Admit: 2014-06-14 | Discharge: 2014-06-14 | Disposition: A | Discharge status: Home / Self Care | Payer: Medicare Other | Source: Ambulatory Visit | Attending: Cardiology | Admitting: Cardiology

## 2014-06-14 DIAGNOSIS — I209 Angina pectoris, unspecified: Secondary | ICD-10-CM | POA: Diagnosis not present

## 2014-06-16 ENCOUNTER — Encounter (HOSPITAL_COMMUNITY): Payer: Medicare Other

## 2014-06-16 ENCOUNTER — Encounter (HOSPITAL_COMMUNITY)
Admission: RE | Admit: 2014-06-16 | Discharge: 2014-06-16 | Disposition: A | Discharge status: Home / Self Care | Payer: Medicare Other | Source: Ambulatory Visit | Attending: Cardiology | Admitting: Cardiology

## 2014-06-16 DIAGNOSIS — I208 Other forms of angina pectoris: Secondary | ICD-10-CM | POA: Insufficient documentation

## 2014-06-19 ENCOUNTER — Encounter (HOSPITAL_COMMUNITY)
Admission: RE | Admit: 2014-06-19 | Discharge: 2014-06-19 | Disposition: A | Discharge status: Home / Self Care | Payer: Medicare Other | Source: Ambulatory Visit | Attending: Cardiology | Admitting: Cardiology

## 2014-06-19 ENCOUNTER — Encounter (HOSPITAL_COMMUNITY): Payer: Medicare Other

## 2014-06-19 DIAGNOSIS — I208 Other forms of angina pectoris: Secondary | ICD-10-CM | POA: Diagnosis not present

## 2014-06-21 ENCOUNTER — Encounter (HOSPITAL_COMMUNITY): Payer: Medicare Other

## 2014-06-21 ENCOUNTER — Encounter (HOSPITAL_COMMUNITY)
Admission: RE | Admit: 2014-06-21 | Discharge: 2014-06-21 | Disposition: A | Discharge status: Home / Self Care | Payer: Medicare Other | Source: Ambulatory Visit | Attending: Cardiology | Admitting: Cardiology

## 2014-06-21 DIAGNOSIS — I208 Other forms of angina pectoris: Secondary | ICD-10-CM | POA: Diagnosis not present

## 2014-06-23 ENCOUNTER — Encounter (HOSPITAL_COMMUNITY): Payer: Medicare Other

## 2014-06-23 ENCOUNTER — Encounter (HOSPITAL_COMMUNITY)
Admission: RE | Admit: 2014-06-23 | Discharge: 2014-06-23 | Disposition: A | Discharge status: Home / Self Care | Payer: Medicare Other | Source: Ambulatory Visit | Attending: Cardiology | Admitting: Cardiology

## 2014-06-23 DIAGNOSIS — I208 Other forms of angina pectoris: Secondary | ICD-10-CM | POA: Diagnosis not present

## 2014-06-26 ENCOUNTER — Encounter (HOSPITAL_COMMUNITY): Payer: Medicare Other

## 2014-06-26 ENCOUNTER — Encounter (HOSPITAL_COMMUNITY)
Admission: RE | Admit: 2014-06-26 | Discharge: 2014-06-26 | Disposition: A | Discharge status: Home / Self Care | Payer: Medicare Other | Source: Ambulatory Visit | Attending: Cardiology | Admitting: Cardiology

## 2014-06-26 DIAGNOSIS — I208 Other forms of angina pectoris: Secondary | ICD-10-CM | POA: Diagnosis not present

## 2014-06-28 ENCOUNTER — Encounter (HOSPITAL_COMMUNITY): Payer: Medicare Other

## 2014-06-28 ENCOUNTER — Encounter (HOSPITAL_COMMUNITY)
Admission: RE | Admit: 2014-06-28 | Discharge: 2014-06-28 | Disposition: A | Discharge status: Home / Self Care | Payer: Medicare Other | Source: Ambulatory Visit | Attending: Cardiology | Admitting: Cardiology

## 2014-06-28 DIAGNOSIS — I208 Other forms of angina pectoris: Secondary | ICD-10-CM | POA: Diagnosis not present

## 2014-06-30 ENCOUNTER — Encounter (HOSPITAL_COMMUNITY): Payer: Medicare Other

## 2014-06-30 ENCOUNTER — Encounter (HOSPITAL_COMMUNITY)
Admission: RE | Admit: 2014-06-30 | Discharge: 2014-06-30 | Disposition: A | Discharge status: Home / Self Care | Payer: Medicare Other | Source: Ambulatory Visit | Attending: Cardiology | Admitting: Cardiology

## 2014-06-30 DIAGNOSIS — I208 Other forms of angina pectoris: Secondary | ICD-10-CM | POA: Diagnosis not present

## 2014-07-03 ENCOUNTER — Encounter (HOSPITAL_COMMUNITY)
Admission: RE | Admit: 2014-07-03 | Discharge: 2014-07-03 | Disposition: A | Discharge status: Home / Self Care | Payer: Medicare Other | Source: Ambulatory Visit | Attending: Cardiology | Admitting: Cardiology

## 2014-07-03 ENCOUNTER — Encounter (HOSPITAL_COMMUNITY): Payer: Medicare Other

## 2014-07-03 DIAGNOSIS — I208 Other forms of angina pectoris: Secondary | ICD-10-CM | POA: Diagnosis not present

## 2014-07-05 ENCOUNTER — Encounter (HOSPITAL_COMMUNITY): Payer: Medicare Other

## 2014-07-05 ENCOUNTER — Encounter (HOSPITAL_COMMUNITY)
Admission: RE | Admit: 2014-07-05 | Discharge: 2014-07-05 | Disposition: A | Discharge status: Home / Self Care | Payer: Medicare Other | Source: Ambulatory Visit | Attending: Cardiology | Admitting: Cardiology

## 2014-07-05 DIAGNOSIS — I208 Other forms of angina pectoris: Secondary | ICD-10-CM | POA: Diagnosis not present

## 2014-07-07 ENCOUNTER — Encounter (HOSPITAL_COMMUNITY): Payer: Medicare Other

## 2014-07-10 ENCOUNTER — Encounter (HOSPITAL_COMMUNITY): Payer: Medicare Other

## 2014-07-10 ENCOUNTER — Encounter (HOSPITAL_COMMUNITY)
Admission: RE | Admit: 2014-07-10 | Discharge: 2014-07-10 | Disposition: A | Discharge status: Home / Self Care | Payer: Medicare Other | Source: Ambulatory Visit | Attending: Cardiology | Admitting: Cardiology

## 2014-07-10 DIAGNOSIS — I208 Other forms of angina pectoris: Secondary | ICD-10-CM | POA: Diagnosis not present

## 2014-07-12 ENCOUNTER — Encounter (HOSPITAL_COMMUNITY): Payer: Medicare Other

## 2014-07-12 ENCOUNTER — Encounter (HOSPITAL_COMMUNITY)
Admission: RE | Admit: 2014-07-12 | Discharge: 2014-07-12 | Disposition: A | Discharge status: Home / Self Care | Payer: Medicare Other | Source: Ambulatory Visit | Attending: Cardiology | Admitting: Cardiology

## 2014-07-12 DIAGNOSIS — I208 Other forms of angina pectoris: Secondary | ICD-10-CM | POA: Diagnosis not present

## 2014-07-14 ENCOUNTER — Encounter (HOSPITAL_COMMUNITY): Payer: Medicare Other

## 2014-07-14 ENCOUNTER — Encounter (HOSPITAL_COMMUNITY): Admission: RE | Admit: 2014-07-14 | Payer: Medicare Other | Source: Ambulatory Visit

## 2014-07-17 ENCOUNTER — Encounter (HOSPITAL_COMMUNITY): Payer: Medicare Other

## 2014-07-19 ENCOUNTER — Encounter (HOSPITAL_COMMUNITY): Payer: Medicare Other

## 2014-07-21 ENCOUNTER — Encounter (HOSPITAL_COMMUNITY): Payer: Medicare Other

## 2014-07-21 ENCOUNTER — Encounter (HOSPITAL_COMMUNITY)
Admission: RE | Admit: 2014-07-21 | Discharge: 2014-07-21 | Disposition: A | Discharge status: Home / Self Care | Payer: Medicare Other | Source: Ambulatory Visit | Attending: Cardiology | Admitting: Cardiology

## 2014-07-21 DIAGNOSIS — I208 Other forms of angina pectoris: Secondary | ICD-10-CM | POA: Insufficient documentation

## 2014-07-24 ENCOUNTER — Encounter (HOSPITAL_COMMUNITY)
Admission: RE | Admit: 2014-07-24 | Discharge: 2014-07-24 | Disposition: A | Discharge status: Home / Self Care | Payer: Medicare Other | Source: Ambulatory Visit | Attending: Cardiology | Admitting: Cardiology

## 2014-07-24 ENCOUNTER — Encounter (HOSPITAL_COMMUNITY): Payer: Medicare Other

## 2014-07-24 DIAGNOSIS — I208 Other forms of angina pectoris: Secondary | ICD-10-CM | POA: Diagnosis not present

## 2014-07-26 ENCOUNTER — Encounter (HOSPITAL_COMMUNITY): Payer: Medicare Other

## 2014-07-26 ENCOUNTER — Encounter (HOSPITAL_COMMUNITY)
Admission: RE | Admit: 2014-07-26 | Discharge: 2014-07-26 | Disposition: A | Discharge status: Home / Self Care | Payer: Medicare Other | Source: Ambulatory Visit | Attending: Cardiology | Admitting: Cardiology

## 2014-07-26 DIAGNOSIS — I208 Other forms of angina pectoris: Secondary | ICD-10-CM | POA: Diagnosis not present

## 2014-07-28 ENCOUNTER — Encounter (HOSPITAL_COMMUNITY): Payer: Medicare Other

## 2014-07-28 ENCOUNTER — Encounter (HOSPITAL_COMMUNITY)
Admission: RE | Admit: 2014-07-28 | Discharge: 2014-07-28 | Disposition: A | Discharge status: Home / Self Care | Payer: Medicare Other | Source: Ambulatory Visit | Attending: Cardiology | Admitting: Cardiology

## 2014-07-28 DIAGNOSIS — I208 Other forms of angina pectoris: Secondary | ICD-10-CM | POA: Diagnosis not present

## 2014-07-31 ENCOUNTER — Encounter (HOSPITAL_COMMUNITY): Payer: Medicare Other

## 2014-07-31 ENCOUNTER — Encounter (HOSPITAL_COMMUNITY)
Admission: RE | Admit: 2014-07-31 | Discharge: 2014-07-31 | Disposition: A | Discharge status: Home / Self Care | Payer: Medicare Other | Source: Ambulatory Visit | Attending: Cardiology | Admitting: Cardiology

## 2014-07-31 DIAGNOSIS — I208 Other forms of angina pectoris: Secondary | ICD-10-CM | POA: Diagnosis not present

## 2014-08-02 ENCOUNTER — Encounter (HOSPITAL_COMMUNITY)
Admission: RE | Admit: 2014-08-02 | Discharge: 2014-08-02 | Disposition: A | Discharge status: Home / Self Care | Payer: Medicare Other | Source: Ambulatory Visit | Attending: Cardiology | Admitting: Cardiology

## 2014-08-02 ENCOUNTER — Encounter (HOSPITAL_COMMUNITY): Payer: Medicare Other

## 2014-08-02 DIAGNOSIS — I208 Other forms of angina pectoris: Secondary | ICD-10-CM | POA: Diagnosis not present

## 2014-08-04 ENCOUNTER — Encounter (HOSPITAL_COMMUNITY): Payer: Medicare Other

## 2014-08-04 ENCOUNTER — Encounter (HOSPITAL_COMMUNITY)
Admission: RE | Admit: 2014-08-04 | Discharge: 2014-08-04 | Disposition: A | Discharge status: Home / Self Care | Payer: Medicare Other | Source: Ambulatory Visit | Attending: Cardiology | Admitting: Cardiology

## 2014-08-04 DIAGNOSIS — I208 Other forms of angina pectoris: Secondary | ICD-10-CM | POA: Diagnosis not present

## 2014-08-07 ENCOUNTER — Encounter (HOSPITAL_COMMUNITY)
Admission: RE | Admit: 2014-08-07 | Discharge: 2014-08-07 | Disposition: A | Discharge status: Home / Self Care | Payer: Medicare Other | Source: Ambulatory Visit | Attending: Cardiology | Admitting: Cardiology

## 2014-08-07 DIAGNOSIS — I208 Other forms of angina pectoris: Secondary | ICD-10-CM | POA: Diagnosis not present

## 2014-08-09 ENCOUNTER — Encounter (HOSPITAL_COMMUNITY)
Admission: RE | Admit: 2014-08-09 | Discharge: 2014-08-09 | Disposition: A | Discharge status: Home / Self Care | Payer: Medicare Other | Source: Ambulatory Visit | Attending: Cardiology | Admitting: Cardiology

## 2014-08-09 DIAGNOSIS — I208 Other forms of angina pectoris: Secondary | ICD-10-CM | POA: Diagnosis not present

## 2014-08-09 NOTE — Progress Notes (Signed)
In anticipation of graduating from cardiac rehab phase II on next week with the completion of 36 exercise sessions consultation completed for post exercise.  Pt transferred to [facility] about one month ago due to personal preference.  Pt made progress with exercise as evident by his increasing average  MET levels.  Pt attended all education classes and demonstrates an appropriate level of understanding of heart healthy lifestyle.  Medication list reconciled. Pt reports compliance with his medications and voices no barriers to acquiring his medications.  Repeat PHQ2 score yields increase to 1 from 0 when previously assessed at the beginning of his participation in rehab.  Pt attributes this to more self awareness of his happiness level.  Pt has participated in spousal counseling but felt he was not treated fairly during the evaluation.  Pt recognizes that he cant change the person but he can change how he reacts to situations that arise within the home.  Pt denies feeling depressed or suicidal.   Pt declined offer for counseling support at th this time stating that he was dealing with it.  Pt relieves his stress with walking and lives in a neighborhood that is conducive to walking.  Pt avgs 2-4 miles a day.  It was a pleasure working with this pt in cardiac rehab.

## 2014-08-14 ENCOUNTER — Encounter (HOSPITAL_COMMUNITY)
Admission: RE | Admit: 2014-08-14 | Discharge: 2014-08-14 | Disposition: A | Discharge status: Home / Self Care | Payer: Medicare Other | Source: Ambulatory Visit | Attending: Cardiology | Admitting: Cardiology

## 2014-08-14 DIAGNOSIS — I208 Other forms of angina pectoris: Secondary | ICD-10-CM | POA: Diagnosis not present

## 2014-08-14 LAB — GLUCOSE, CAPILLARY: Glucose-Capillary: 108 mg/dL — ABNORMAL HIGH (ref 70–99)

## 2014-08-16 ENCOUNTER — Encounter (HOSPITAL_COMMUNITY)
Admission: RE | Admit: 2014-08-16 | Discharge: 2014-08-16 | Disposition: A | Discharge status: Home / Self Care | Payer: Medicare Other | Source: Ambulatory Visit | Attending: Cardiology | Admitting: Cardiology

## 2014-08-16 DIAGNOSIS — I208 Other forms of angina pectoris: Secondary | ICD-10-CM | POA: Insufficient documentation

## 2014-08-18 ENCOUNTER — Encounter (HOSPITAL_COMMUNITY): Payer: Medicare Other

## 2014-08-21 ENCOUNTER — Encounter (HOSPITAL_COMMUNITY): Payer: Medicare Other

## 2014-08-23 ENCOUNTER — Encounter (HOSPITAL_COMMUNITY): Payer: Medicare Other

## 2014-08-24 ENCOUNTER — Encounter (HOSPITAL_COMMUNITY): Payer: Self-pay | Admitting: Cardiology

## 2014-08-25 ENCOUNTER — Encounter (HOSPITAL_COMMUNITY): Payer: Medicare Other

## 2014-08-28 ENCOUNTER — Encounter (HOSPITAL_COMMUNITY): Payer: Medicare Other

## 2014-08-30 ENCOUNTER — Encounter (HOSPITAL_COMMUNITY): Payer: Medicare Other

## 2014-09-01 ENCOUNTER — Encounter (HOSPITAL_COMMUNITY): Payer: Medicare Other

## 2014-10-16 ENCOUNTER — Encounter: Payer: Self-pay | Admitting: Internal Medicine

## 2015-08-30 ENCOUNTER — Encounter: Payer: Medicare Other | Attending: Family Medicine | Admitting: *Deleted

## 2015-08-30 VITALS — Ht 66.0 in | Wt 168.2 lb

## 2015-08-30 DIAGNOSIS — E119 Type 2 diabetes mellitus without complications: Secondary | ICD-10-CM | POA: Diagnosis present

## 2015-08-30 NOTE — Patient Instructions (Addendum)
Plan:  Aim for 2-3 Carb Choices per meal (30-45 grams) +/- 1 either way  Aim for 0-15 Carbs per snack if hungry  Include protein in moderation with your meals and snacks Consider reading food labels for Total Carbohydrate and Fat Grams of foods continue your activity level by walking for 60 minutes daily as tolerated Consider checking BG at alternate times per day to include before breakfast and two hour after a meal as directed by MD  Continue taking medication as directed by MD  Canned vegetables, pour out the liquid or consider frozen vegetables OK eat one slice of bread 1/2 banana and peanut butter Discontinue Regular Coke - try diet - try to stick with it for 10 days and see if your taste changes Stay away from fruit juice Try to decrease the sugar amount of your lemonade  Keep up the good work with exercise

## 2015-09-04 ENCOUNTER — Encounter: Payer: Self-pay | Admitting: *Deleted

## 2015-09-04 NOTE — Progress Notes (Signed)
Diabetes Self-Management Education  Visit Type: First/Initial  Appt. Start Time: 0900 Appt. End Time: 1030  09/04/2015  Fernando Hess, identified by name and date of birth, is a 69 y.o. male with a diagnosis of Diabetes: Type 2. Fernando Hess is married and retired as a [employer]. He makes frequent reference to his wife but doesn't want her here to learn with him. He digresses in conversation easily.  ASSESSMENT  Height 5\' 6"  (1.676 m), weight 168 lb 3.2 oz (76.295 kg). Body mass index is 27.16 kg/(m^2).      Diabetes Self-Management Education - 08/30/15 0947    Visit Information   Visit Type First/Initial   Initial Visit   Diabetes Type Type 2   Date Diagnosed 1999   Health Coping   How would you rate your overall health? Good   Psychosocial Assessment   Patient Belief/Attitude about Diabetes Motivated to manage diabetes   Self-care barriers None   Self-management support Doctor's office;CDE visits   Other persons present Patient   Patient Concerns Nutrition/Meal planning;Healthy Lifestyle;Glycemic Control   Special Needs None   Preferred Learning Style No preference indicated   Learning Readiness Change in progress   How often do you need to have someone help you when you read instructions, pamphlets, or other written materials from your doctor or pharmacy? 1 - Never   Complications   Last HgB A1C per patient/outside source 9 %   How often do you check your blood sugar? 3-4 times / week   Fasting Blood glucose range (mg/dL) 130-179   Have you had a dilated eye exam in the past 12 months? Yes   Have you had a dental exam in the past 12 months? Yes   Are you checking your feet? Yes   How many days per week are you checking your feet? 7   Dietary Intake   Breakfast 1 egg, 2 bacon, 1 toast, coffee, small sugar and small milk Whole milk   Snack (morning) none   Lunch country style steak w/gravy, mashed potatoes, green beans, water   Dinner sausage & sauerkraute, /  chick-fil-A , lemondade, fries   Snack (evening) sliced peaches   Exercise   How many days per week to you exercise? 7   How many minutes per day do you exercise? 90   Total minutes per week of exercise 630   Patient Education   Previous Diabetes Education Yes (please comment)  1999 at time of diagnosis   Disease state  Definition of diabetes, type 1 and 2, and the diagnosis of diabetes;Factors that contribute to the development of diabetes   Nutrition management  Role of diet in the treatment of diabetes and the relationship between the three main macronutrients and blood glucose level;Food label reading, portion sizes and measuring food.;Carbohydrate counting;Reviewed blood glucose goals for pre and post meals and how to evaluate the patients' food intake on their blood glucose level.;Meal options for control of blood glucose level and chronic complications.   Physical activity and exercise  Role of exercise on diabetes management, blood pressure control and cardiac health.   Medications Reviewed medication adjustment guidelines for hyperglycemia and sick days.   Monitoring Purpose and frequency of SMBG.   Chronic complications Relationship between chronic complications and blood glucose control;Assessed and discussed foot care and prevention of foot problems   Psychosocial adjustment Role of stress on diabetes   Individualized Goals (developed by patient)   Nutrition General guidelines for healthy choices and portions discussed  Physical Activity Exercise 5-7 days per week;60 minutes per day   Medications take my medication as prescribed   Monitoring  test blood glucose pre and post meals as discussed   Reducing Risk do foot checks daily;increase portions of nuts and seeds;increase portions of olive oil in diet   Health Coping ask for help with (comment);Other (comment)  offered continued services available at [facility]   Outcomes   Expected Outcomes Demonstrated interest in learning. Expect  positive outcomes   Future DMSE PRN   Program Status Completed      Individualized Plan for Diabetes Self-Management Training:   Learning Objective:  Patient will have a greater understanding of diabetes self-management. Patient education plan is to attend individual and/or group sessions per assessed needs and concerns.   Plan:   Patient Instructions  Plan:  Aim for 2-3 Carb Choices per meal (30-45 grams) +/- 1 either way  Aim for 0-15 Carbs per snack if hungry  Include protein in moderation with your meals and snacks Consider reading food labels for Total Carbohydrate and Fat Grams of foods continue your activity level by walking for 60 minutes daily as tolerated Consider checking BG at alternate times per day to include before breakfast and two hour after a meal as directed by MD  Continue taking medication as directed by MD  Canned vegetables, pour out the liquid or consider frozen vegetables OK eat one slice of bread 1/2 banana and peanut butter Discontinue Regular Coke - try diet - try to stick with it for 10 days and see if your taste changes Stay away from fruit juice Try to decrease the sugar amount of your lemonade  Keep up the good work with exercise   Expected Outcomes:  Demonstrated interest in learning. Expect positive outcomes  Education material provided: Living Well with Diabetes, A1C conversion sheet, Meal plan card, My Plate and Snack sheet  If problems or questions, patient to contact team via:  Phone  Future DSME appointment: PRN

## 2015-09-16 HISTORY — PX: CATARACT EXTRACTION W/ INTRAOCULAR LENS  IMPLANT, BILATERAL: SHX1307

## 2015-09-24 ENCOUNTER — Inpatient Hospital Stay (HOSPITAL_COMMUNITY)
Admission: EM | Admit: 2015-09-24 | Discharge: 2015-10-08 | DRG: 234 | Disposition: A | Discharge status: Home / Self Care | Payer: Medicare Other | Attending: Cardiothoracic Surgery | Admitting: Cardiothoracic Surgery

## 2015-09-24 ENCOUNTER — Encounter (HOSPITAL_COMMUNITY): Payer: Self-pay | Admitting: *Deleted

## 2015-09-24 ENCOUNTER — Emergency Department (HOSPITAL_COMMUNITY): Payer: Medicare Other

## 2015-09-24 DIAGNOSIS — Z833 Family history of diabetes mellitus: Secondary | ICD-10-CM | POA: Diagnosis not present

## 2015-09-24 DIAGNOSIS — E785 Hyperlipidemia, unspecified: Secondary | ICD-10-CM | POA: Diagnosis present

## 2015-09-24 DIAGNOSIS — Z8249 Family history of ischemic heart disease and other diseases of the circulatory system: Secondary | ICD-10-CM | POA: Diagnosis not present

## 2015-09-24 DIAGNOSIS — Z888 Allergy status to other drugs, medicaments and biological substances status: Secondary | ICD-10-CM

## 2015-09-24 DIAGNOSIS — I10 Essential (primary) hypertension: Secondary | ICD-10-CM | POA: Diagnosis present

## 2015-09-24 DIAGNOSIS — I214 Non-ST elevation (NSTEMI) myocardial infarction: Secondary | ICD-10-CM | POA: Diagnosis present

## 2015-09-24 DIAGNOSIS — Z7902 Long term (current) use of antithrombotics/antiplatelets: Secondary | ICD-10-CM | POA: Diagnosis not present

## 2015-09-24 DIAGNOSIS — I2510 Atherosclerotic heart disease of native coronary artery without angina pectoris: Secondary | ICD-10-CM | POA: Diagnosis not present

## 2015-09-24 DIAGNOSIS — I25110 Atherosclerotic heart disease of native coronary artery with unstable angina pectoris: Secondary | ICD-10-CM

## 2015-09-24 DIAGNOSIS — N179 Acute kidney failure, unspecified: Secondary | ICD-10-CM | POA: Diagnosis present

## 2015-09-24 DIAGNOSIS — E113312 Type 2 diabetes mellitus with moderate nonproliferative diabetic retinopathy with macular edema, left eye: Secondary | ICD-10-CM

## 2015-09-24 DIAGNOSIS — D62 Acute posthemorrhagic anemia: Secondary | ICD-10-CM | POA: Diagnosis not present

## 2015-09-24 DIAGNOSIS — D696 Thrombocytopenia, unspecified: Secondary | ICD-10-CM | POA: Diagnosis not present

## 2015-09-24 DIAGNOSIS — I4891 Unspecified atrial fibrillation: Secondary | ICD-10-CM | POA: Diagnosis not present

## 2015-09-24 DIAGNOSIS — I252 Old myocardial infarction: Secondary | ICD-10-CM

## 2015-09-24 DIAGNOSIS — E119 Type 2 diabetes mellitus without complications: Secondary | ICD-10-CM | POA: Diagnosis present

## 2015-09-24 DIAGNOSIS — Z7982 Long term (current) use of aspirin: Secondary | ICD-10-CM

## 2015-09-24 DIAGNOSIS — R634 Abnormal weight loss: Secondary | ICD-10-CM | POA: Diagnosis present

## 2015-09-24 DIAGNOSIS — R778 Other specified abnormalities of plasma proteins: Secondary | ICD-10-CM

## 2015-09-24 DIAGNOSIS — I9789 Other postprocedural complications and disorders of the circulatory system, not elsewhere classified: Secondary | ICD-10-CM | POA: Diagnosis not present

## 2015-09-24 DIAGNOSIS — Z79899 Other long term (current) drug therapy: Secondary | ICD-10-CM | POA: Diagnosis not present

## 2015-09-24 DIAGNOSIS — R7989 Other specified abnormal findings of blood chemistry: Secondary | ICD-10-CM

## 2015-09-24 DIAGNOSIS — R079 Chest pain, unspecified: Secondary | ICD-10-CM | POA: Diagnosis not present

## 2015-09-24 DIAGNOSIS — E8770 Fluid overload, unspecified: Secondary | ICD-10-CM | POA: Diagnosis not present

## 2015-09-24 DIAGNOSIS — R197 Diarrhea, unspecified: Secondary | ICD-10-CM | POA: Diagnosis present

## 2015-09-24 DIAGNOSIS — Z7984 Long term (current) use of oral hypoglycemic drugs: Secondary | ICD-10-CM | POA: Diagnosis not present

## 2015-09-24 DIAGNOSIS — Z951 Presence of aortocoronary bypass graft: Secondary | ICD-10-CM

## 2015-09-24 DIAGNOSIS — Z01818 Encounter for other preprocedural examination: Secondary | ICD-10-CM

## 2015-09-24 DIAGNOSIS — Z881 Allergy status to other antibiotic agents status: Secondary | ICD-10-CM | POA: Diagnosis not present

## 2015-09-24 HISTORY — DX: Personal history of urinary calculi: Z87.442

## 2015-09-24 HISTORY — DX: Atherosclerotic heart disease of native coronary artery without angina pectoris: I25.10

## 2015-09-24 HISTORY — DX: Acute kidney failure, unspecified: N17.9

## 2015-09-24 HISTORY — DX: Old myocardial infarction: I25.2

## 2015-09-24 LAB — CBC
HCT: 42.3 % (ref 39.0–52.0)
Hemoglobin: 13.4 g/dL (ref 13.0–17.0)
MCH: 27 pg (ref 26.0–34.0)
MCHC: 31.7 g/dL (ref 30.0–36.0)
MCV: 85.1 fL (ref 78.0–100.0)
PLATELETS: 214 10*3/uL (ref 150–400)
RBC: 4.97 MIL/uL (ref 4.22–5.81)
RDW: 17.7 % — ABNORMAL HIGH (ref 11.5–15.5)
WBC: 9.1 10*3/uL (ref 4.0–10.5)

## 2015-09-24 LAB — COMPREHENSIVE METABOLIC PANEL
ALT: 20 U/L (ref 17–63)
AST: 25 U/L (ref 15–41)
Albumin: 3.4 g/dL — ABNORMAL LOW (ref 3.5–5.0)
Alkaline Phosphatase: 48 U/L (ref 38–126)
Anion gap: 12 (ref 5–15)
BUN: 32 mg/dL — ABNORMAL HIGH (ref 6–20)
CHLORIDE: 107 mmol/L (ref 101–111)
CO2: 23 mmol/L (ref 22–32)
CREATININE: 1.74 mg/dL — AB (ref 0.61–1.24)
Calcium: 9 mg/dL (ref 8.9–10.3)
GFR, EST AFRICAN AMERICAN: 44 mL/min — AB (ref >60)
GFR, EST NON AFRICAN AMERICAN: 38 mL/min — AB (ref >60)
Glucose, Bld: 92 mg/dL (ref 65–99)
POTASSIUM: 4.2 mmol/L (ref 3.5–5.1)
Sodium: 142 mmol/L (ref 135–145)
Total Bilirubin: 1.2 mg/dL (ref 0.3–1.2)
Total Protein: 5.7 g/dL — ABNORMAL LOW (ref 6.5–8.1)

## 2015-09-24 LAB — BASIC METABOLIC PANEL
Anion gap: 13 (ref 5–15)
BUN: 32 mg/dL — AB (ref 6–20)
CALCIUM: 9.7 mg/dL (ref 8.9–10.3)
CHLORIDE: 107 mmol/L (ref 101–111)
CO2: 21 mmol/L — ABNORMAL LOW (ref 22–32)
CREATININE: 1.93 mg/dL — AB (ref 0.61–1.24)
GFR calc Af Amer: 39 mL/min — ABNORMAL LOW (ref >60)
GFR calc non Af Amer: 33 mL/min — ABNORMAL LOW (ref >60)
Glucose, Bld: 212 mg/dL — ABNORMAL HIGH (ref 65–99)
Potassium: 4.8 mmol/L (ref 3.5–5.1)
SODIUM: 141 mmol/L (ref 135–145)

## 2015-09-24 LAB — TSH: TSH: 0.745 u[IU]/mL (ref 0.350–4.500)

## 2015-09-24 LAB — I-STAT TROPONIN, ED: TROPONIN I, POC: 0.28 ng/mL — AB (ref 0.00–0.08)

## 2015-09-24 LAB — TROPONIN I: TROPONIN I: 1.47 ng/mL — AB (ref <0.031)

## 2015-09-24 MED ORDER — ASPIRIN 81 MG PO CHEW
81.0000 mg | CHEWABLE_TABLET | Freq: Every day | ORAL | Status: DC
  Administered 2015-09-25 – 2015-10-01 (×7): 81 mg via ORAL
  Filled 2015-09-24 (×7): qty 1

## 2015-09-24 MED ORDER — HEPARIN BOLUS VIA INFUSION
4000.0000 [IU] | Freq: Once | INTRAVENOUS | Status: AC
  Administered 2015-09-24: 4000 [IU] via INTRAVENOUS
  Filled 2015-09-24: qty 4000

## 2015-09-24 MED ORDER — INSULIN ASPART 100 UNIT/ML ~~LOC~~ SOLN
0.0000 [IU] | Freq: Three times a day (TID) | SUBCUTANEOUS | Status: DC
  Administered 2015-09-25 (×2): 5 [IU] via SUBCUTANEOUS
  Administered 2015-09-26: 3 [IU] via SUBCUTANEOUS
  Administered 2015-09-27: 2 [IU] via SUBCUTANEOUS
  Administered 2015-09-27: 5 [IU] via SUBCUTANEOUS
  Administered 2015-09-27: 2 [IU] via SUBCUTANEOUS
  Administered 2015-09-28 (×2): 3 [IU] via SUBCUTANEOUS
  Administered 2015-09-29: 2 [IU] via SUBCUTANEOUS
  Administered 2015-09-29 – 2015-09-30 (×3): 3 [IU] via SUBCUTANEOUS
  Administered 2015-10-01: 11 [IU] via SUBCUTANEOUS
  Administered 2015-10-01: 2 [IU] via SUBCUTANEOUS
  Administered 2015-10-01: 3 [IU] via SUBCUTANEOUS

## 2015-09-24 MED ORDER — NITROGLYCERIN 0.4 MG SL SUBL: 0.4000 mg | SUBLINGUAL_TABLET | SUBLINGUAL | Status: DC | PRN

## 2015-09-24 MED ORDER — HEPARIN (PORCINE) IN NACL 100-0.45 UNIT/ML-% IJ SOLN
900.0000 [IU]/h | INTRAMUSCULAR | Status: DC
  Administered 2015-09-24 – 2015-09-25 (×2): 900 [IU]/h via INTRAVENOUS
  Filled 2015-09-24 (×2): qty 250

## 2015-09-24 MED ORDER — ASPIRIN 81 MG PO CHEW
324.0000 mg | CHEWABLE_TABLET | Freq: Once | ORAL | Status: AC
  Administered 2015-09-24: 324 mg via ORAL
  Filled 2015-09-24: qty 4

## 2015-09-24 MED ORDER — NITROGLYCERIN 0.4 MG SL SUBL
0.4000 mg | SUBLINGUAL_TABLET | SUBLINGUAL | Status: DC | PRN
  Administered 2015-09-27 (×2): 0.4 mg via SUBLINGUAL
  Filled 2015-09-24: qty 1

## 2015-09-24 MED ORDER — CLOPIDOGREL BISULFATE 75 MG PO TABS
75.0000 mg | ORAL_TABLET | Freq: Every day | ORAL | Status: DC
  Administered 2015-09-24 – 2015-09-26 (×3): 75 mg via ORAL
  Filled 2015-09-24 (×3): qty 1

## 2015-09-24 MED ORDER — GLIMEPIRIDE 4 MG PO TABS
8.0000 mg | ORAL_TABLET | Freq: Every day | ORAL | Status: DC
  Administered 2015-09-25 – 2015-10-01 (×7): 8 mg via ORAL
  Filled 2015-09-24 (×7): qty 2

## 2015-09-24 MED ORDER — ATORVASTATIN CALCIUM 40 MG PO TABS
40.0000 mg | ORAL_TABLET | Freq: Every day | ORAL | Status: DC
  Administered 2015-09-24 – 2015-10-08 (×14): 40 mg via ORAL
  Filled 2015-09-24 (×15): qty 1

## 2015-09-24 MED ORDER — ACETAMINOPHEN 325 MG PO TABS
650.0000 mg | ORAL_TABLET | ORAL | Status: DC | PRN
  Administered 2015-10-01: 650 mg via ORAL
  Filled 2015-09-24: qty 2

## 2015-09-24 MED ORDER — SODIUM CHLORIDE 0.9 % IV SOLN
INTRAVENOUS | Status: DC
  Administered 2015-09-24: 19:00:00 via INTRAVENOUS

## 2015-09-24 MED ORDER — SODIUM CHLORIDE 0.9 % IV SOLN
Freq: Once | INTRAVENOUS | Status: AC
  Administered 2015-09-24: 17:00:00 via INTRAVENOUS

## 2015-09-24 MED ORDER — ONDANSETRON HCL 4 MG/2ML IJ SOLN: 4.0000 mg | Freq: Four times a day (QID) | INTRAMUSCULAR | Status: DC | PRN

## 2015-09-24 MED ORDER — METOPROLOL SUCCINATE ER 25 MG PO TB24
25.0000 mg | ORAL_TABLET | Freq: Every day | ORAL | Status: DC
  Administered 2015-09-24 – 2015-09-27 (×4): 25 mg via ORAL
  Filled 2015-09-24 (×4): qty 1

## 2015-09-24 MED ORDER — NITROGLYCERIN IN D5W 200-5 MCG/ML-% IV SOLN
2.0000 ug/min | INTRAVENOUS | Status: DC
  Administered 2015-09-24: 2 ug/min via INTRAVENOUS
  Filled 2015-09-24: qty 250

## 2015-09-24 MED ORDER — PANTOPRAZOLE SODIUM 40 MG PO TBEC
40.0000 mg | DELAYED_RELEASE_TABLET | Freq: Every day | ORAL | Status: DC
  Administered 2015-09-24 – 2015-10-01 (×8): 40 mg via ORAL
  Filled 2015-09-24 (×8): qty 1

## 2015-09-24 NOTE — Progress Notes (Signed)
Called re: positive troponin  Patient currently chest pain free on IV heparin and has received ASA and plavix.  Has also received beta blocker and high intensity statin.  Will order ECG.  Terressa Koyanagi, MD [employer]

## 2015-09-24 NOTE — Progress Notes (Signed)
CRITICAL VALUE ALERT  Critical value received:  TROPONIN 1.47  Date of notification:  09/24/2015  Time of notification:  2214  Critical value read back:Yes.    Nurse who received alert:  Radene Ou, RN  MD notified (1st page):  SZE  Time of first page:  2222  MD notified (2nd page):  Time of second page:  Responding MD:  Karlyn Agee  Time MD responded:  2223

## 2015-09-24 NOTE — ED Notes (Addendum)
Pt sent here from [facility]. Has multiple complaints. Reports taking double dose of medications recently, having confusion, fatigue, dizziness,weight loss and neck pain intermittently x 1 year, occurred today. ekg done on arrival and no acute distress noted.

## 2015-09-24 NOTE — Progress Notes (Signed)
ANTICOAGULATION CONSULT NOTE - Initial Consult  Pharmacy Consult for Heparin Indication: ACS/ STEMI  Allergies  Allergen Reactions  . Actos [Pioglitazone]     Itching, rash   . Levaquin [Levofloxacin] Itching and Rash  . Lisinopril Itching and Rash    Patient Measurements: Total Body Wt: 76.3 kg Ideal Body Wt: 63.8 kg  Vital Signs: Temp: 98.3 F (36.8 C) (01/09 1523) Temp Source: Oral (01/09 1523) BP: 136/74 mmHg (01/09 1800) Pulse Rate: 71 (01/09 1800)  Labs:  Recent Labs  09/24/15 1531  HGB 13.4  HCT 42.3  PLT 214  CREATININE 1.93*    CrCl cannot be calculated (Unknown ideal weight.).   Medical History: Past Medical History  Diagnosis Date  . Diabetes mellitus without complication ([place])   . Hypertension   . Hyperlipidemia   . Kidney stones   . CAD (coronary artery disease), native coronary artery 09/24/2015  . History of kidney stones     Medications:   (Not in a hospital admission) Scheduled:  . [START ON 09/25/2015] insulin aspart  0-15 Units Subcutaneous TID WC    Assessment: 70yo male w/ hx of DM, HTN, HLD, and coronary disease.  Presents to ED w/ neck and jaw pain, dizziness, malaise and fatigue. Pharmacy consulted to dose Heparin for ACS/STEMI. sCr 1.93, Hgb 13.4  Goal of Therapy:  Heparin level 0.3-0.7 units/ml Monitor platelets by anticoagulation protocol: Yes   Plan:  Give 4000 units bolus x 1 Start heparin infusion at 900 units/hr Check anti-Xa level in 8 hours and daily while on heparin Continue to monitor H&H and platelets  Follow up BNP, [place]  Hammer, Ethan 09/24/2015,6:36 PM    Agree with the above assessment and plan. No anticoagulants PTA.  Nena Jordan, PharmD, BCPS 09/24/2015, 6:56 PM

## 2015-09-24 NOTE — ED Notes (Signed)
Dinner tray ordered @1942 

## 2015-09-24 NOTE — ED Notes (Signed)
Attempted report 

## 2015-09-24 NOTE — ED Provider Notes (Signed)
CSN: [id number]     Arrival date & time 09/24/15  1507 History   First MD Initiated Contact with Patient 09/24/15 1622     Chief Complaint  Patient presents with  . Chest Pain  . Abnormal Lab     (Consider location/radiation/quality/duration/timing/severity/associated sxs/prior Treatment) HPI Comments: Left Jaw and neck pain Severe today Began last week, severe last weekend, happened months ago Aching 10-12/10 Happened with exertion before Was all day prior to coming here, now pain resolved Saw Dr. Sabra Heck, talked to Dr. [name] cardiologist Martin Majestic to cardiac rehab last year but was improved Hx of CAD  Patient is a 70 y.o. male presenting with chest pain.  Chest Pain Associated symptoms: no abdominal pain, no back pain, no fever, no headache, no nausea, no shortness of breath and not vomiting     Past Medical History  Diagnosis Date  . Diabetes mellitus without complication ([place])   . Hypertension   . Hyperlipidemia   . Kidney stones   . CAD (coronary artery disease), native coronary artery 09/24/2015  . History of kidney stones    Past Surgical History  Procedure Laterality Date  . Cataract extraction, bilateral    . Leg surgery  70 yrs old    from fx. right  . Appendectomy    . Left heart catheterization with coronary angiogram N/A 04/13/2014    Procedure: LEFT HEART CATHETERIZATION WITH CORONARY ANGIOGRAM;  Surgeon: Jacolyn Reedy, MD;  Location: [facility] CATH LAB;  Service: Cardiovascular;  Laterality: N/A;   Family History  Problem Relation Age of Onset  . Heart attack Mother   . Heart attack Father   . Diabetes Brother   . Pancreatic cancer Brother   . Colon cancer Neg Hx   . Esophageal cancer Neg Hx   . Prostate cancer Neg Hx   . Rectal cancer Neg Hx   . Stomach cancer Neg Hx   . Diabetes Brother    Social History  Substance Use Topics  . Smoking status: Never Smoker   . Smokeless tobacco: Never Used  . Alcohol Use: Yes     Comment: 1 beer/wine every 2-3 months  per patient    Review of Systems  Constitutional: Negative for fever.  HENT: Negative for sore throat.        Jaw pain   Eyes: Negative for visual disturbance.  Respiratory: Negative for shortness of breath and wheezing.   Cardiovascular: Negative for chest pain.  Gastrointestinal: Negative for nausea, vomiting and abdominal pain.  Genitourinary: Negative for difficulty urinating.  Musculoskeletal: Positive for neck pain. Negative for back pain and neck stiffness.  Skin: Negative for rash.  Neurological: Negative for syncope and headaches.      Allergies  Actos; Levaquin; and Lisinopril  Home Medications   Prior to Admission medications   Medication Sig Start Date End Date Taking? Authorizing Provider  acetaminophen (TYLENOL) 325 MG tablet Take 650 mg by mouth every 6 (six) hours as needed for mild pain.   Yes Historical Provider, MD  aspirin 81 MG chewable tablet Chew by mouth daily.   Yes Historical Provider, MD  atorvastatin (LIPITOR) 40 MG tablet Take 40 mg by mouth daily.   Yes Historical Provider, MD  clopidogrel (PLAVIX) 75 MG tablet Take 75 mg by mouth daily.   Yes Historical Provider, MD  glimepiride (AMARYL) 4 MG tablet Take 8 mg by mouth daily with breakfast. Takes 1 and 1/2 tablets by mouth daily with breakfast   Yes Historical Provider, MD  Liraglutide (VICTOZA) 18 MG/3ML SOPN Inject 0.6 mLs into the skin daily. Wife states doctor has taken him off of medication, because of possible "Allergic Reaction" However patient states he is still taking medication   Yes Historical Provider, MD  losartan (COZAAR) 100 MG tablet Take 100 mg by mouth daily.   Yes Historical Provider, MD  metFORMIN (GLUCOPHAGE) 500 MG tablet Take 2 tablets (1,000 mg total) by mouth 2 (two) times daily with a meal. Begin taking on Saturday morning 04/13/14  Yes Jacolyn Reedy, MD  metoprolol succinate (TOPROL-XL) 25 MG 24 hr tablet Take 25 mg by mouth daily.   Yes Historical Provider, MD  Multiple  Vitamin (MULTIVITAMIN) tablet Take 1 tablet by mouth daily.   Yes Historical Provider, MD  nitroGLYCERIN (NITROSTAT) 0.4 MG SL tablet Place 0.4 mg under the tongue every 5 (five) minutes as needed for chest pain.   Yes Historical Provider, MD  ondansetron (ZOFRAN-ODT) 4 MG disintegrating tablet Take 4 mg by mouth every 8 (eight) hours as needed for nausea or vomiting. Pt states he takes at home when he feels nauseous   Yes Historical Provider, MD  pantoprazole (PROTONIX) 40 MG tablet Take 40 mg by mouth daily.   Yes Historical Provider, MD  ONE TOUCH ULTRA TEST test strip  10/31/13   Historical Provider, MD   BP 136/61 mmHg  Pulse 97  Temp(Src) 98.1 F (36.7 C) (Oral)  Resp 18  Ht 5\' 6"  (1.676 m)  Wt 155 lb 4.8 oz (70.444 kg)  BMI 25.08 kg/m2  SpO2 96% Physical Exam  Constitutional: He is oriented to person, place, and time. He appears well-developed and well-nourished. No distress.  HENT:  Head: Normocephalic and atraumatic.  Eyes: Conjunctivae and EOM are normal.  Neck: Normal range of motion.  Cardiovascular: Normal rate, regular rhythm, normal heart sounds and intact distal pulses.  Exam reveals no gallop and no friction rub.   No murmur heard. Pulmonary/Chest: Effort normal and breath sounds normal. No respiratory distress. He has no wheezes. He has no rales.  Abdominal: Soft. He exhibits no distension. There is no tenderness. There is no guarding.  Musculoskeletal: He exhibits no edema.  Neurological: He is alert and oriented to person, place, and time.  Skin: Skin is warm and dry. He is not diaphoretic.  Nursing note and vitals reviewed.   ED Course  Procedures (including critical care time) Labs Review Labs Reviewed  BASIC METABOLIC PANEL - Abnormal; Notable for the following:    CO2 21 (*)    Glucose, Bld 212 (*)    BUN 32 (*)    Creatinine, Ser 1.93 (*)    GFR calc non Af Amer 33 (*)    GFR calc Af Amer 39 (*)    All other components within normal limits  CBC -  Abnormal; Notable for the following:    RDW 17.7 (*)    All other components within normal limits  COMPREHENSIVE METABOLIC PANEL - Abnormal; Notable for the following:    BUN 32 (*)    Creatinine, Ser 1.74 (*)    Total Protein 5.7 (*)    Albumin 3.4 (*)    GFR calc non Af Amer 38 (*)    GFR calc Af Amer 44 (*)    All other components within normal limits  TROPONIN I - Abnormal; Notable for the following:    Troponin I 1.47 (*)    All other components within normal limits  I-STAT TROPOININ, ED - Abnormal; Notable for  the following:    Troponin i, poc 0.28 (*)    All other components within normal limits  TSH  HEMOGLOBIN A1C  TROPONIN I  TROPONIN I  BASIC METABOLIC PANEL  HEPARIN LEVEL (UNFRACTIONATED)  CBC  LIPID PANEL    Imaging Review Dg Chest 2 View  09/24/2015  CLINICAL DATA:  Aching pain in the temporal region extending into the jaw. EXAM: CHEST  2 VIEW COMPARISON:  Radiographs 04/11/2014 and 10/02/2009. FINDINGS: The heart size and mediastinal contours are normal. The lungs are clear. There is no pleural effusion or pneumothorax. No acute osseous findings are identified. IMPRESSION: No active cardiopulmonary process. Electronically Signed   By: Richardean Sale M.D.   On: 09/24/2015 16:04   I have personally reviewed and evaluated these images and lab results as part of my medical decision-making.   EKG Interpretation None      04/13/2014 IMPRESSIONS:  1. Moderate diffuse 2 vessel coronary artery disease and somewhat small coronary arteries 2. Normal left ventricle.[name]  RECOMMENDATION: Initial medical therapy. At first glance the lesions do not appear to be suitable for PCI however will have interventional cardiology review. Discharge later today.  MDM   Final diagnoses:  Coronary artery disease involving native coronary artery of native heart with unstable angina pectoris ([name])  Non-STEMI (non-ST elevated myocardial infarction) ([name])  Elevated troponin    70 year old male with history of CAD, htn, hlpd, presents with concern of jaw and neck pain.  Pain was exertional prior, however today was severe and constant. Pt seen by Dr. Sabra Heck and sent to ED.  EKG was done and evaluate by me and showed anterior TW inversions. Chest x-ray was done and evaluated by me and radiology and showed  no sign of pneumonia or pneumothorax. Patient is low risk [place] and have low suspicion for PE.  Troponin elevated.  Pt given ASA.  Concern for NSTEMI by hx and troponin elevation. Cardiology consulted and pt to be admitted for further care.  Gareth Morgan, MD 09/25/15 [phone]

## 2015-09-24 NOTE — H&P (Signed)
History and Physical   Admit date: 09/24/2015 Name:  Fernando Hess Medical record number: [id number] DOB/Age:  72/17/1947  70 y.o. male  Referring Physician:  [facility]   Primary Cardiologist:  [name]  Primary Physician:   C. Melinda Crutch  Chief complaint/reason for admission: Neck and jaw pain, dizziness, malaise and fatigue   HPI:  This 70 year old male has a history of diabetes, hypertension hyperlipidemia and coronary disease.  He was seen in July 2015 and had an abnormal treadmill test following an episode of vomiting faintness and throat tightness with exertion.  Catheterization showed somewhat small diabetic vessels with moderate disease in the circumflex LAD and mild disease in the right coronary artery.  Interventional cardiology reviewed the films and did not feel there was any role for intervention and it was opted to treat him medically.  He had continued to have some mild throat tightness and was noted to have his medications increased and was seen in cardiac rehabilitation this summer.  He completed cardiac rehabilitation in November and did well with that was having minimal symptoms.  I saw him just a few weeks ago which point in time he was asymptomatic and doing well.  He had recently been placed on Victoza that he states cause significant anorexia and he has lost 10 pounds of weight in the past month.  He has felt poorly and complained of dizziness and foggy thinking.  After Thanksgiving he was driving to [place] and had the onset of bilateral jaw pain as well as headache and this is occurred intermittently since then he has had 2 stop his activities.  Over the weekend he had noticed an increase in his symptoms and had a particular severe episode this morning.  He was seen at his primary care doctor's office and was noted to have neck pain, dizziness, significant constitutional symptoms and was confused over his medications.  The discomfort this morning was prolonged and  severe and lasted several hours and was presents to a certain extent in the physician's office.  An EKG there did not show ST depression but he had ST depression noted in the emergency room.  Lab work in the emergency room showed renal function that had worsened since his previous lab that was done here at the hospital a year and a half ago.  He denies PND or orthopnea.   Past Medical History  Diagnosis Date  . Diabetes mellitus without complication ([place])   . Hypertension   . Hyperlipidemia   . Kidney stones   . CAD (coronary artery disease), native coronary artery 09/24/2015  . History of kidney stones      Past Surgical History  Procedure Laterality Date  . Cataract extraction, bilateral    . Leg surgery  70 yrs old    from fx. right  . Appendectomy    . Left heart catheterization with coronary angiogram N/A 04/13/2014    Procedure: LEFT HEART CATHETERIZATION WITH CORONARY ANGIOGRAM;  Surgeon: Jacolyn Reedy, MD;  Location: [facility];  Service: Cardiovascular;  Laterality: N/A;   Allergies: is allergic to actos; levaquin; and lisinopril.   Medications: Prior to Admission medications   Medication Sig Start Date End Date Taking? Authorizing Provider  acetaminophen (TYLENOL) 325 MG tablet Take 650 mg by mouth every 6 (six) hours as needed for mild pain.   Yes Historical Provider, MD  aspirin 81 MG chewable tablet Chew by mouth daily.   Yes Historical Provider, MD  atorvastatin (LIPITOR) 40  MG tablet Take 40 mg by mouth daily.   Yes Historical Provider, MD  clopidogrel (PLAVIX) 75 MG tablet Take 75 mg by mouth daily.   Yes Historical Provider, MD  glimepiride (AMARYL) 4 MG tablet Take 8 mg by mouth daily with breakfast. Takes 1 and 1/2 tablets by mouth daily with breakfast   Yes Historical Provider, MD  Liraglutide (VICTOZA) 18 MG/3ML SOPN Inject 0.6 mLs into the skin daily. Wife states doctor has taken him off of medication, because of possible "Allergic Reaction" However patient states  he is still taking medication   Yes Historical Provider, MD  losartan (COZAAR) 100 MG tablet Take 100 mg by mouth daily.   Yes Historical Provider, MD  metFORMIN (GLUCOPHAGE) 500 MG tablet Take 2 tablets (1,000 mg total) by mouth 2 (two) times daily with a meal. Begin taking on Saturday morning 04/13/14  Yes Jacolyn Reedy, MD  metoprolol succinate (TOPROL-XL) 25 MG 24 hr tablet Take 25 mg by mouth daily.   Yes Historical Provider, MD  Multiple Vitamin (MULTIVITAMIN) tablet Take 1 tablet by mouth daily.   Yes Historical Provider, MD  nitroGLYCERIN (NITROSTAT) 0.4 MG SL tablet Place 0.4 mg under the tongue every 5 (five) minutes as needed for chest pain.   Yes Historical Provider, MD  ondansetron (ZOFRAN-ODT) 4 MG disintegrating tablet Take 4 mg by mouth every 8 (eight) hours as needed for nausea or vomiting. Pt states he takes at home when he feels nauseous   Yes Historical Provider, MD  pantoprazole (PROTONIX) 40 MG tablet Take 40 mg by mouth daily.   Yes Historical Provider, MD  ONE TOUCH ULTRA TEST test strip  10/31/13   Historical Provider, MD   Family History:  Family Status  Relation Status Death Age  . Mother Deceased     Myocardial infarction, cancer  . Father Deceased     Myocardial infarction  . Brother Deceased    Social History:   reports that he has never smoked. He has never used smokeless tobacco. He reports that he drinks alcohol. He reports that he does not use illicit drugs.   Social History   Social History Narrative    Review of Systems:  Significant dizziness, significant nausea and lack of appetite, numbness of lower extremities, mild frequency Other than as noted above, the remainder of the review of systems is normal  Physical Exam: BP 112/92 mmHg  Pulse 90  Temp(Src) 98.3 F (36.8 C) (Oral)  Resp 16  SpO2 100% General appearance: Pleasant WM in NAD Head: Normocephalic, without obvious abnormality, atraumatic, Balding male hair pattern Eyes:  conjunctivae/corneas clear. PERRL, EOM's intact. Fundi not examined Neck: no adenopathy, no carotid bruit, no JVD and supple, symmetrical, trachea midline Lungs: clear to auscultation bilaterally Heart: regular rate and rhythm, S1, S2 normal, no murmur, click, rub or gallop Abdomen: soft, non-tender; bowel sounds normal; no masses,  no organomegaly Rectal: deferred Extremities: extremities normal, atraumatic, no cyanosis or edema Pulses: 2+ and symmetric Skin: Skin color, texture, turgor normal. No rashes or lesions Neurologic: Grossly normal  Labs: CBC  Recent Labs  09/24/15 1531  WBC 9.1  RBC 4.97  HGB 13.4  HCT 42.3  PLT 214  MCV 85.1  MCH 27.0  MCHC 31.7  RDW 17.7*   CMP   Recent Labs  09/24/15 1531  NA 141  K 4.8  CL 107  CO2 21*  GLUCOSE 212*  BUN 32*  CREATININE 1.93*  CALCIUM 9.7  GFRNONAA 33*  GFRAA 39*  Troponin (Point of Care Test)  Recent Labs  09/24/15 1529  TROPIPOC 0.28*   EKG: Sinus rhythm, ST depression laterally, occasional PACs and PVCs  Radiology: No active cardiopulmonary process   IMPRESSIONS: 1.  Unstable angina pectoris/non-STEMI 2.  Coronary artery disease three-vessel previously 3.  Non-insulin-dependent diabetes mellitus 4.  Hypertension 5.  Acute renal insufficiency 6.  Hyperlipidemia under treatment 7.  Weight loss of 10 pounds 8.  Confusion and malaise and fatigue as well as dizziness of uncertain etiology   PLAN: Admission to hospital.  Begin intravenous heparin and nitroglycerin.  Stop Victoza.  Obtain echocardiogram.  Hydration overnight.  Stop ARB and follow renal function.  Following stabilization of renal function will need catheterization.  Check serial cardiac enzymes.    Signed: Kerry Hough MD [facility]  09/24/2015, 6:17 PM

## 2015-09-25 LAB — BASIC METABOLIC PANEL
Anion gap: 9 (ref 5–15)
BUN: 30 mg/dL — AB (ref 6–20)
CALCIUM: 8.1 mg/dL — AB (ref 8.9–10.3)
CO2: 24 mmol/L (ref 22–32)
CREATININE: 1.39 mg/dL — AB (ref 0.61–1.24)
Chloride: 112 mmol/L — ABNORMAL HIGH (ref 101–111)
GFR calc Af Amer: 57 mL/min — ABNORMAL LOW (ref >60)
GFR, EST NON AFRICAN AMERICAN: 49 mL/min — AB (ref >60)
GLUCOSE: 84 mg/dL (ref 65–99)
Potassium: 4.2 mmol/L (ref 3.5–5.1)
Sodium: 145 mmol/L (ref 135–145)

## 2015-09-25 LAB — GLUCOSE, CAPILLARY
GLUCOSE-CAPILLARY: 72 mg/dL (ref 65–99)
Glucose-Capillary: 205 mg/dL — ABNORMAL HIGH (ref 65–99)
Glucose-Capillary: 211 mg/dL — ABNORMAL HIGH (ref 65–99)
Glucose-Capillary: 201 mg/dL — ABNORMAL HIGH (ref 65–99)

## 2015-09-25 LAB — HEMOGLOBIN A1C
HEMOGLOBIN A1C: 6.7 % — AB (ref 4.8–5.6)
MEAN PLASMA GLUCOSE: 146 mg/dL

## 2015-09-25 LAB — HEPARIN LEVEL (UNFRACTIONATED)
HEPARIN UNFRACTIONATED: 0.43 [IU]/mL (ref 0.30–0.70)
Heparin Unfractionated: 0.32 IU/mL (ref 0.30–0.70)

## 2015-09-25 LAB — CBC
HCT: 34.1 % — ABNORMAL LOW (ref 39.0–52.0)
Hemoglobin: 11 g/dL — ABNORMAL LOW (ref 13.0–17.0)
MCH: 27.2 pg (ref 26.0–34.0)
MCHC: 32.3 g/dL (ref 30.0–36.0)
MCV: 84.2 fL (ref 78.0–100.0)
PLATELETS: 166 10*3/uL (ref 150–400)
RBC: 4.05 MIL/uL — AB (ref 4.22–5.81)
RDW: 17.9 % — AB (ref 11.5–15.5)
WBC: 8.2 10*3/uL (ref 4.0–10.5)

## 2015-09-25 LAB — LIPID PANEL
CHOL/HDL RATIO: 3.2 ratio
Cholesterol: 70 mg/dL (ref 0–200)
HDL: 22 mg/dL — ABNORMAL LOW (ref >40)
LDL Cholesterol: 32 mg/dL (ref 0–99)
Triglycerides: 79 mg/dL (ref <150)
VLDL: 16 mg/dL (ref 0–40)

## 2015-09-25 LAB — C DIFFICILE QUICK SCREEN W PCR REFLEX
C DIFFICILE (CDIFF) TOXIN: NEGATIVE
C DIFFICLE (CDIFF) ANTIGEN: NEGATIVE
C Diff interpretation: NEGATIVE

## 2015-09-25 LAB — TROPONIN I
TROPONIN I: 2.46 ng/mL — AB (ref <0.031)
TROPONIN I: 1.99 ng/mL — AB (ref <0.031)

## 2015-09-25 MED ORDER — SODIUM CHLORIDE 0.9 % IJ SOLN
3.0000 mL | Freq: Two times a day (BID) | INTRAMUSCULAR | Status: DC
  Administered 2015-09-25: 3 mL via INTRAVENOUS

## 2015-09-25 MED ORDER — SODIUM CHLORIDE 0.9 % WEIGHT BASED INFUSION
1.0000 mL/kg/h | INTRAVENOUS | Status: DC
  Administered 2015-09-26: 1 mL/kg/h via INTRAVENOUS

## 2015-09-25 MED ORDER — SODIUM CHLORIDE 0.9 % IV SOLN: 250.0000 mL | INTRAVENOUS | Status: DC | PRN

## 2015-09-25 MED ORDER — SODIUM CHLORIDE 0.9 % WEIGHT BASED INFUSION
3.0000 mL/kg/h | INTRAVENOUS | Status: DC
  Administered 2015-09-26: 3 mL/kg/h via INTRAVENOUS

## 2015-09-25 MED ORDER — ASPIRIN 81 MG PO CHEW
81.0000 mg | CHEWABLE_TABLET | ORAL | Status: AC
  Administered 2015-09-26: 81 mg via ORAL
  Filled 2015-09-25: qty 1

## 2015-09-25 MED ORDER — SODIUM CHLORIDE 0.9 % IJ SOLN: 3.0000 mL | INTRAMUSCULAR | Status: DC | PRN

## 2015-09-25 NOTE — Progress Notes (Signed)
Subjective:  Complains of a mild amount of diarrhea.  Appetite is better.  Renal function is better today.  Objective:  Vital Signs in the last 24 hours: BP 129/71 mmHg  Pulse 65  Temp(Src) 98.5 F (36.9 C) (Oral)  Resp 18  Ht 5\' 6"  (1.676 m)  Wt 71.3 kg (157 lb 3 oz)  BMI 25.38 kg/m2  SpO2 99%  Physical Exam: Pleasant male in no acute distress Lungs:  Clear Cardiac:  Regular rhythm, normal S1 and S2, no S3 Extremities:  No edema present  Intake/Output from previous day: 01/09 0701 - 01/10 0700 In: -  Out: 200 [Urine:200]  Weight Filed Weights   09/24/15 1800 09/24/15 2040 09/25/15 0409  Weight: 76.3 kg (168 lb 3.4 oz) 70.444 kg (155 lb 4.8 oz) 71.3 kg (157 lb 3 oz)    Lab Results: Basic Metabolic Panel:  Recent Labs  09/24/15 1531 09/24/15 2137  NA 141 142  K 4.8 4.2  CL 107 107  CO2 21* 23  GLUCOSE 212* 92  BUN 32* 32*  CREATININE 1.93* 1.74*   CBC:  Recent Labs  09/24/15 1531 09/25/15 0800  WBC 9.1 8.2  HGB 13.4 11.0*  HCT 42.3 34.1*  MCV 85.1 84.2  PLT 214 166   Cardiac Enzymes: Troponin (Point of Care Test)  Recent Labs  09/24/15 1529  TROPIPOC 0.28*   Cardiac Panel (last 3 results)  Recent Labs  09/24/15 2137 09/25/15 0209  TROPONINI 1.47* 2.46*    Telemetry: Sinus rhythm  EKG: New T-wave inversions noted in V2 and V3, T waves flattened laterally  Assessment/Plan:  1.  Non-STEMI 2.  Acute renal failure improving 3.  Non-insulin-dependent diabetes mellitus 4.  New diarrhea  Recommendations:  He has had no symptoms overnight and is feeling much better today.  Some mild diarrhea.  His renal function was totally normally a year and a half ago and ARB is on hold.  He is hydrated and renal function is somewhat better.  In light of his diabetes would like to hydrate him another day and plan catheterization in the morning as he may require stenting.  We'll check echocardiogram to help with minimization of contrast load  today.  Cardiac catheterization was discussed with the patient fully including risks of myocardial infarction, death, stroke, bleeding, arrhythmia, dye allergy, renal insufficiency or bleeding.  The patient understands and is willing to proceed.  Discussed radial approach as well as possible need for stenting.      Kerry Hough  MD [facility]  09/25/2015, 9:06 AM

## 2015-09-25 NOTE — Progress Notes (Signed)
UR Completed. Kyrstan Gotwalt, RN, BSN.  [phone] 

## 2015-09-25 NOTE — Progress Notes (Signed)
PATIENT ARRIVED TO UNIT 2W FROM E.D. VIA STRETCHER. TRANSFERRED TO BED. TELE APPLIED. VITALS OBTAINED. ASSESSMENT COMPLETED.   PATIENT ORIENTED TO UNIT AND EQUIPMENT. INSTRUCTED TO CALL FOR ASSISTANCE WHEN NEEDED. BED ALARM ARMED.  SPOUSE AT BEDSIDE.

## 2015-09-25 NOTE — Progress Notes (Signed)
ANTICOAGULATION CONSULT NOTE - Follow Up Consult  Pharmacy Consult for Heparin  Indication: chest pain/ACS  Allergies  Allergen Reactions  . Actos [Pioglitazone]     Itching, rash   . Levaquin [Levofloxacin] Itching and Rash  . Lisinopril Itching and Rash    Patient Measurements: Height: 5\' 6"  (167.6 cm) Weight: 157 lb 3 oz (71.3 kg) IBW/kg (Calculated) : 63.8  Vital Signs: Temp: 98.5 F (36.9 C) (01/10 0409) Temp Source: Oral (01/10 0409) BP: 129/71 mmHg (01/10 0409) Pulse Rate: 88 (01/10 0944)  Labs:  Recent Labs  09/24/15 1531 09/24/15 2137 09/25/15 0209 09/25/15 0800 09/25/15 1030  HGB 13.4  --   --  11.0*  --   HCT 42.3  --   --  34.1*  --   PLT 214  --   --  166  --   HEPARINUNFRC  --   --  0.32  --  0.43  CREATININE 1.93* 1.74*  --  1.39*  --   TROPONINI  --  1.47* 2.46* 1.99*  --     Estimated Creatinine Clearance: 44 mL/min (by C-G formula based on Cr of 1.39).   Assessment: 70 yo male with NSTEMI on heparin at 900 units/hr and heparin level is at goal (HL= 0.43). Plans noted for cath 1/11.   Goal of Therapy:  Heparin level 0.3-0.7 units/ml Monitor platelets by anticoagulation protocol: Yes   Plan:  -No heparin changes needed -Daily heparin level and CBC  Hildred Laser, Pharm D 09/25/2015 11:35 AM

## 2015-09-25 NOTE — Progress Notes (Signed)
ANTICOAGULATION CONSULT NOTE - Follow Up Consult  Pharmacy Consult for Heparin  Indication: chest pain/ACS  Allergies  Allergen Reactions  . Actos [Pioglitazone]     Itching, rash   . Levaquin [Levofloxacin] Itching and Rash  . Lisinopril Itching and Rash    Patient Measurements: Height: 5\' 6"  (167.6 cm) Weight: 155 lb 4.8 oz (70.444 kg) IBW/kg (Calculated) : 63.8  Vital Signs: Temp: 98.1 F (36.7 C) (01/09 2040) Temp Source: Oral (01/09 2040) BP: 136/61 mmHg (01/09 2040) Pulse Rate: 97 (01/09 2040)  Labs:  Recent Labs  09/24/15 1531 09/24/15 2137 09/25/15 0209  HGB 13.4  --   --   HCT 42.3  --   --   PLT 214  --   --   HEPARINUNFRC  --   --  0.32  CREATININE 1.93* 1.74*  --   TROPONINI  --  1.47* 2.46*    Estimated Creatinine Clearance: 35.1 mL/min (by C-G formula based on Cr of 1.74).   Assessment: Heparin for positive troponin, initial heparin level is therapeutic at 0.32  Goal of Therapy:  Heparin level 0.3-0.7 units/ml Monitor platelets by anticoagulation protocol: Yes   Plan:  -Continue heparin at 900 units/hr -1100 HL  Madellyn Denio 09/25/2015,3:04 AM

## 2015-09-26 ENCOUNTER — Other Ambulatory Visit: Payer: Self-pay | Admitting: *Deleted

## 2015-09-26 ENCOUNTER — Encounter (HOSPITAL_COMMUNITY)
Admission: EM | Disposition: A | Discharge status: Home / Self Care | Payer: Self-pay | Source: Home / Self Care | Attending: Cardiology

## 2015-09-26 ENCOUNTER — Inpatient Hospital Stay (HOSPITAL_COMMUNITY): Payer: Medicare Other

## 2015-09-26 DIAGNOSIS — I2510 Atherosclerotic heart disease of native coronary artery without angina pectoris: Secondary | ICD-10-CM

## 2015-09-26 DIAGNOSIS — R7989 Other specified abnormal findings of blood chemistry: Secondary | ICD-10-CM

## 2015-09-26 DIAGNOSIS — R778 Other specified abnormalities of plasma proteins: Secondary | ICD-10-CM | POA: Insufficient documentation

## 2015-09-26 DIAGNOSIS — R079 Chest pain, unspecified: Secondary | ICD-10-CM

## 2015-09-26 HISTORY — PX: CARDIAC CATHETERIZATION: SHX172

## 2015-09-26 LAB — CBC
HEMATOCRIT: 32.5 % — AB (ref 39.0–52.0)
HEMOGLOBIN: 10.7 g/dL — AB (ref 13.0–17.0)
MCH: 27.6 pg (ref 26.0–34.0)
MCHC: 32.9 g/dL (ref 30.0–36.0)
MCV: 83.8 fL (ref 78.0–100.0)
Platelets: 170 10*3/uL (ref 150–400)
RBC: 3.88 MIL/uL — AB (ref 4.22–5.81)
RDW: 17.8 % — AB (ref 11.5–15.5)
WBC: 6.5 10*3/uL (ref 4.0–10.5)

## 2015-09-26 LAB — BASIC METABOLIC PANEL WITH GFR
Anion gap: 6 (ref 5–15)
BUN: 17 mg/dL (ref 6–20)
CO2: 23 mmol/L (ref 22–32)
Calcium: 8 mg/dL — ABNORMAL LOW (ref 8.9–10.3)
Chloride: 112 mmol/L — ABNORMAL HIGH (ref 101–111)
Creatinine, Ser: 1.11 mg/dL (ref 0.61–1.24)
GFR calc Af Amer: >60 mL/min (ref >60)
GFR calc non Af Amer: >60 mL/min (ref >60)
Glucose, Bld: 234 mg/dL — ABNORMAL HIGH (ref 65–99)
Potassium: 3.8 mmol/L (ref 3.5–5.1)
Sodium: 141 mmol/L (ref 135–145)

## 2015-09-26 LAB — GLUCOSE, CAPILLARY
GLUCOSE-CAPILLARY: 151 mg/dL — AB (ref 65–99)
GLUCOSE-CAPILLARY: 104 mg/dL — AB (ref 65–99)
GLUCOSE-CAPILLARY: 176 mg/dL — AB (ref 65–99)
Glucose-Capillary: 86 mg/dL (ref 65–99)

## 2015-09-26 LAB — PROTIME-INR
INR: 1.24 (ref 0.00–1.49)
PROTHROMBIN TIME: 15.7 s — AB (ref 11.6–15.2)

## 2015-09-26 LAB — HEPARIN LEVEL (UNFRACTIONATED): Heparin Unfractionated: 0.32 [IU]/mL (ref 0.30–0.70)

## 2015-09-26 SURGERY — LEFT HEART CATH AND CORONARY ANGIOGRAPHY
Anesthesia: LOCAL

## 2015-09-26 MED ORDER — ONDANSETRON HCL 4 MG/2ML IJ SOLN: 4.0000 mg | Freq: Four times a day (QID) | INTRAMUSCULAR | Status: DC | PRN

## 2015-09-26 MED ORDER — FENTANYL CITRATE (PF) 100 MCG/2ML IJ SOLN
INTRAMUSCULAR | Status: AC
  Filled 2015-09-26: qty 2

## 2015-09-26 MED ORDER — SODIUM CHLORIDE 0.9 % IJ SOLN: 3.0000 mL | INTRAMUSCULAR | Status: DC | PRN

## 2015-09-26 MED ORDER — HEPARIN (PORCINE) IN NACL 2-0.9 UNIT/ML-% IJ SOLN
INTRAMUSCULAR | Status: AC
  Filled 2015-09-26: qty 500

## 2015-09-26 MED ORDER — ACETAMINOPHEN 325 MG PO TABS
650.0000 mg | ORAL_TABLET | ORAL | Status: DC | PRN
  Administered 2015-09-30: 650 mg via ORAL
  Filled 2015-09-26: qty 2

## 2015-09-26 MED ORDER — HEPARIN SODIUM (PORCINE) 1000 UNIT/ML IJ SOLN
INTRAMUSCULAR | Status: DC | PRN
  Administered 2015-09-26: 3600 [IU] via INTRAVENOUS

## 2015-09-26 MED ORDER — VERAPAMIL HCL 2.5 MG/ML IV SOLN
INTRA_ARTERIAL | Status: DC | PRN
  Administered 2015-09-26: 20 mL via INTRA_ARTERIAL

## 2015-09-26 MED ORDER — LIDOCAINE HCL (PF) 1 % IJ SOLN
INTRAMUSCULAR | Status: DC | PRN
  Administered 2015-09-26: 2 mL

## 2015-09-26 MED ORDER — MIDAZOLAM HCL 2 MG/2ML IJ SOLN
INTRAMUSCULAR | Status: AC
  Filled 2015-09-26: qty 2

## 2015-09-26 MED ORDER — MIDAZOLAM HCL 2 MG/2ML IJ SOLN
INTRAMUSCULAR | Status: DC | PRN
  Administered 2015-09-26: 1 mg via INTRAVENOUS
  Administered 2015-09-26: 2 mg via INTRAVENOUS

## 2015-09-26 MED ORDER — LIDOCAINE HCL (PF) 1 % IJ SOLN
INTRAMUSCULAR | Status: AC
  Filled 2015-09-26: qty 30

## 2015-09-26 MED ORDER — NITROGLYCERIN 1 MG/10 ML FOR IR/CATH LAB
INTRA_ARTERIAL | Status: DC | PRN
  Administered 2015-09-26: 15:00:00

## 2015-09-26 MED ORDER — HEPARIN SODIUM (PORCINE) 1000 UNIT/ML IJ SOLN
INTRAMUSCULAR | Status: AC
  Filled 2015-09-26: qty 1

## 2015-09-26 MED ORDER — HEPARIN (PORCINE) IN NACL 100-0.45 UNIT/ML-% IJ SOLN
1100.0000 [IU]/h | INTRAMUSCULAR | Status: DC
  Administered 2015-09-26: 900 [IU]/h via INTRAVENOUS
  Administered 2015-09-28 – 2015-10-01 (×4): 1100 [IU]/h via INTRAVENOUS
  Filled 2015-09-26 (×6): qty 250

## 2015-09-26 MED ORDER — VERAPAMIL HCL 2.5 MG/ML IV SOLN
INTRAVENOUS | Status: AC
  Filled 2015-09-26: qty 2

## 2015-09-26 MED ORDER — SODIUM CHLORIDE 0.9 % IJ SOLN
3.0000 mL | Freq: Two times a day (BID) | INTRAMUSCULAR | Status: DC
  Administered 2015-09-27 – 2015-09-29 (×2): 3 mL via INTRAVENOUS

## 2015-09-26 MED ORDER — FENTANYL CITRATE (PF) 100 MCG/2ML IJ SOLN
INTRAMUSCULAR | Status: DC | PRN
  Administered 2015-09-26 (×2): 25 ug via INTRAVENOUS

## 2015-09-26 MED ORDER — SODIUM CHLORIDE 0.9 % IV SOLN
INTRAVENOUS | Status: DC
  Administered 2015-09-26 – 2015-09-27 (×3): via INTRAVENOUS

## 2015-09-26 MED ORDER — NITROGLYCERIN 1 MG/10 ML FOR IR/CATH LAB
INTRA_ARTERIAL | Status: AC
  Filled 2015-09-26: qty 10

## 2015-09-26 MED ORDER — SODIUM CHLORIDE 0.9 % IV SOLN
250.0000 mL | INTRAVENOUS | Status: DC | PRN
  Administered 2015-09-29: 250 mL via INTRAVENOUS

## 2015-09-26 MED ORDER — IOHEXOL 350 MG/ML SOLN
INTRAVENOUS | Status: DC | PRN
  Administered 2015-09-26: 125 mL via INTRAVENOUS

## 2015-09-26 MED ORDER — DIAZEPAM 5 MG PO TABS: 5.0000 mg | ORAL_TABLET | Freq: Four times a day (QID) | ORAL | Status: DC | PRN

## 2015-09-26 SURGICAL SUPPLY — 11 items
CATH INFINITI 5FR ANG PIGTAIL (CATHETERS) ×2 IMPLANT
CATH INFINITI JR4 5F (CATHETERS) ×1 IMPLANT
CATH OPTITORQUE TIG 4.0 5F (CATHETERS) ×2 IMPLANT
DEVICE RAD COMP TR BAND LRG (VASCULAR PRODUCTS) ×2 IMPLANT
GLIDESHEATH SLEND SS 6F .021 (SHEATH) ×1 IMPLANT
KIT HEART LEFT (KITS) ×2 IMPLANT
PACK CARDIAC CATHETERIZATION (CUSTOM PROCEDURE TRAY) ×2 IMPLANT
SYR MEDRAD MARK V 150ML (SYRINGE) ×2 IMPLANT
TRANSDUCER W/STOPCOCK (MISCELLANEOUS) ×2 IMPLANT
TUBING CIL FLEX 10 FLL-RA (TUBING) ×2 IMPLANT
WIRE SAFE-T 1.5MM-J .035X260CM (WIRE) ×2 IMPLANT

## 2015-09-26 NOTE — Progress Notes (Signed)
  Echocardiogram 2D Echocardiogram has been performed.  Jennette Dubin 09/26/2015, 9:46 AM

## 2015-09-26 NOTE — Progress Notes (Signed)
Patient stable following cardiac catheterization.  Cardiac catheterization films are personally reviewed and showed diffuse three-vessel disease worse in the LAD with its branches as well as the circumflex.  In light of his diabetes.  Recommended coronary bypass grafting.  We'll ask for surgical consultation.  He has been on Plavix and we will discontinue this.  Continue aspirin, heparin and intravenous nitroglycerin.  Follow renal function following catheterization.  May cut back hydration in the morning following cardiac cath.  Kerry Hough MD [facility] 4:59 PM

## 2015-09-26 NOTE — Progress Notes (Signed)
ANTICOAGULATION CONSULT NOTE - Follow Up Consult  Pharmacy Consult for Heparin  Indication: 3v disease s/p cath   Allergies  Allergen Reactions  . Actos [Pioglitazone]     Itching, rash   . Levaquin [Levofloxacin] Itching and Rash  . Lisinopril Itching and Rash    Patient Measurements: Height: 5\' 6"  (167.6 cm) Weight: 161 lb 6 oz (73.2 kg) IBW/kg (Calculated) : 63.8  Vital Signs: Temp: 97.6 F (36.4 C) (01/11 1534) Temp Source: Oral (01/11 1304) BP: 140/74 mmHg (01/11 1534) Pulse Rate: 52 (01/11 1534)  Labs:  Recent Labs  09/24/15 1531 09/24/15 2137 09/25/15 0209 09/25/15 0800 09/25/15 1030 09/26/15 0313  HGB 13.4  --   --  11.0*  --  10.7*  HCT 42.3  --   --  34.1*  --  32.5*  PLT 214  --   --  166  --  170  LABPROT  --   --   --   --   --  15.7*  INR  --   --   --   --   --  1.24  HEPARINUNFRC  --   --  0.32  --  0.43 0.32  CREATININE 1.93* 1.74*  --  1.39*  --  1.11  TROPONINI  --  1.47* 2.46* 1.99*  --   --     Estimated Creatinine Clearance: 55.1 mL/min (by C-G formula based on Cr of 1.11).   Assessment: 70 yo male with NSTEMI  S/p LHC with sheath removal at 1443. Pt with 3v disease. CVTS consulted. Pharmacy consulted to resume heparin 8 hours post sheath removal.   Goal of Therapy:  Heparin level 0.3-0.7 units/ml Monitor platelets by anticoagulation protocol: Yes   Plan:  -Resume heparin infusion at 900 units/hr at 2300 (8 hours post sheath removal). NO BOLUS -F/u 8 hr HL  -Monitor daily HL, CBC and s/s of bleeding -F/u CVTS plan   Albertina Parr, PharmD., BCPS Clinical Pharmacist Pager [phone]

## 2015-09-26 NOTE — Interval H&P Note (Signed)
Cath Lab Visit (complete for each Cath Lab visit)  Clinical Evaluation Leading to the Procedure:   ACS: Yes.    Non-ACS:    Anginal Classification: CCS IV  Anti-ischemic medical therapy: Maximal Therapy (2 or more classes of medications)  Non-Invasive Test Results: No non-invasive testing performed  Prior CABG: No previous CABG      History and Physical Interval Note:  09/26/2015 1:56 PM  [name]  has presented today for surgery, with the diagnosis of nstemi  The various methods of treatment have been discussed with the patient and family. After consideration of risks, benefits and other options for treatment, the patient has consented to  Procedure(s): Left Heart Cath and Coronary Angiography (N/A) as a surgical intervention .  The patient's history has been reviewed, patient examined, no change in status, stable for surgery.  I have reviewed the patient's chart and labs.  Questions were answered to the patient's satisfaction.     KELLY,THOMAS A

## 2015-09-26 NOTE — H&P (View-Only) (Signed)
Subjective:  Diarrhea has improved today.  C. difficile was negative.  No recurrent neck pain or throat tightness.  Troponins have come down now.  Renal function has now normalized.  Objective:  Vital Signs in the last 24 hours: BP 148/72 mmHg  Pulse 55  Temp(Src) 97.7 F (36.5 C) (Oral)  Resp 18  Ht 5\' 6"  (1.676 m)  Wt 73.2 kg (161 lb 6 oz)  BMI 26.06 kg/m2  SpO2 100%  Physical Exam: Pleasant male in no acute distress Lungs:  Clear Cardiac:  Regular rhythm, normal S1 and S2, no S3 Extremities:  No edema present  Intake/Output from previous day: 01/10 0701 - 01/11 0700 In: 120 [P.O.:120] Out: 550 [Urine:550]  Weight Filed Weights   09/24/15 2040 09/25/15 0409 09/26/15 0327  Weight: 70.444 kg (155 lb 4.8 oz) 71.3 kg (157 lb 3 oz) 73.2 kg (161 lb 6 oz)    Lab Results: Basic Metabolic Panel:  Recent Labs  09/25/15 0800 09/26/15 0313  NA 145 141  K 4.2 3.8  CL 112* 112*  CO2 24 23  GLUCOSE 84 234*  BUN 30* 17  CREATININE 1.39* 1.11   CBC:  Recent Labs  09/25/15 0800 09/26/15 0313  WBC 8.2 6.5  HGB 11.0* 10.7*  HCT 34.1* 32.5*  MCV 84.2 83.8  PLT 166 170   Cardiac Enzymes: Troponin (Point of Care Test)  Recent Labs  09/24/15 1529  TROPIPOC 0.28*   Cardiac Panel (last 3 results)  Recent Labs  09/24/15 2137 09/25/15 0209 09/25/15 0800  TROPONINI 1.47* 2.46* 1.99*    Telemetry: Sinus rhythm  EKG: New T-wave inversions noted in V2 and V3, T waves flattened laterally  Assessment/Plan:  1.  Non-STEMI 2.  Acute renal failure normalized 3.  Non-insulin-dependent diabetes mellitus  Recommendations:   echo has not been done yet.  We'll ask echo tech to do early this morning.  Catheterization planned for later today.  Continues on heparin and nitroglycerin.    Kerry Hough  MD [facility] Card[facility]  09/26/2015, 8:47 AM

## 2015-09-26 NOTE — Progress Notes (Signed)
ANTICOAGULATION CONSULT NOTE - Follow Up Consult  Pharmacy Consult for Heparin  Indication: chest pain/ACS  Allergies  Allergen Reactions  . Actos [Pioglitazone]     Itching, rash   . Levaquin [Levofloxacin] Itching and Rash  . Lisinopril Itching and Rash    Patient Measurements: Height: 5\' 6"  (167.6 cm) Weight: 161 lb 6 oz (73.2 kg) IBW/kg (Calculated) : 63.8  Vital Signs: Temp: 97.7 F (36.5 C) (01/11 0327) Temp Source: Oral (01/11 0327) BP: 148/72 mmHg (01/11 0327) Pulse Rate: 55 (01/11 0327)  Labs:  Recent Labs  09/24/15 1531 09/24/15 2137 09/25/15 0209 09/25/15 0800 09/25/15 1030 09/26/15 0313  HGB 13.4  --   --  11.0*  --  10.7*  HCT 42.3  --   --  34.1*  --  32.5*  PLT 214  --   --  166  --  170  LABPROT  --   --   --   --   --  15.7*  INR  --   --   --   --   --  1.24  HEPARINUNFRC  --   --  0.32  --  0.43 0.32  CREATININE 1.93* 1.74*  --  1.39*  --  1.11  TROPONINI  --  1.47* 2.46* 1.99*  --   --     Estimated Creatinine Clearance: 55.1 mL/min (by C-G formula based on Cr of 1.11).   Assessment: 70 yo male with NSTEMI on heparin at 900 units/hr and heparin level is at goal (HL= 0.32). Plans noted for cath today.   Goal of Therapy:  Heparin level 0.3-0.7 units/ml Monitor platelets by anticoagulation protocol: Yes   Plan:  -No heparin changes needed -Will follow plans post cath  Hildred Laser, Pharm D 09/26/2015 10:34 AM

## 2015-09-26 NOTE — Progress Notes (Signed)
Subjective:  Diarrhea has improved today.  C. difficile was negative.  No recurrent neck pain or throat tightness.  Troponins have come down now.  Renal function has now normalized.  Objective:  Vital Signs in the last 24 hours: BP 148/72 mmHg  Pulse 55  Temp(Src) 97.7 F (36.5 C) (Oral)  Resp 18  Ht 5\' 6"  (1.676 m)  Wt 73.2 kg (161 lb 6 oz)  BMI 26.06 kg/m2  SpO2 100%  Physical Exam: Pleasant male in no acute distress Lungs:  Clear Cardiac:  Regular rhythm, normal S1 and S2, no S3 Extremities:  No edema present  Intake/Output from previous day: 01/10 0701 - 01/11 0700 In: 120 [P.O.:120] Out: 550 [Urine:550]  Weight Filed Weights   09/24/15 2040 09/25/15 0409 09/26/15 0327  Weight: 70.444 kg (155 lb 4.8 oz) 71.3 kg (157 lb 3 oz) 73.2 kg (161 lb 6 oz)    Lab Results: Basic Metabolic Panel:  Recent Labs  09/25/15 0800 09/26/15 0313  NA 145 141  K 4.2 3.8  CL 112* 112*  CO2 24 23  GLUCOSE 84 234*  BUN 30* 17  CREATININE 1.39* 1.11   CBC:  Recent Labs  09/25/15 0800 09/26/15 0313  WBC 8.2 6.5  HGB 11.0* 10.7*  HCT 34.1* 32.5*  MCV 84.2 83.8  PLT 166 170   Cardiac Enzymes: Troponin (Point of Care Test)  Recent Labs  09/24/15 1529  TROPIPOC 0.28*   Cardiac Panel (last 3 results)  Recent Labs  09/24/15 2137 09/25/15 0209 09/25/15 0800  TROPONINI 1.47* 2.46* 1.99*    Telemetry: Sinus rhythm  EKG: New T-wave inversions noted in V2 and V3, T waves flattened laterally  Assessment/Plan:  1.  Non-STEMI 2.  Acute renal failure normalized 3.  Non-insulin-dependent diabetes mellitus  Recommendations:   echo has not been done yet.  We'll ask echo tech to do early this morning.  Catheterization planned for later today.  Continues on heparin and nitroglycerin.    Kerry Hough  MD [facility]  09/26/2015, 8:47 AM

## 2015-09-26 NOTE — Progress Notes (Signed)
Patient lying in bed no distress or pain. Family present at bedside, call light within reach.

## 2015-09-27 ENCOUNTER — Encounter (HOSPITAL_COMMUNITY): Payer: Self-pay | Admitting: Cardiovascular Disease

## 2015-09-27 DIAGNOSIS — I25110 Atherosclerotic heart disease of native coronary artery with unstable angina pectoris: Secondary | ICD-10-CM

## 2015-09-27 LAB — CBC
HEMATOCRIT: 33 % — AB (ref 39.0–52.0)
HEMOGLOBIN: 10.6 g/dL — AB (ref 13.0–17.0)
MCH: 26.8 pg (ref 26.0–34.0)
MCHC: 32.1 g/dL (ref 30.0–36.0)
MCV: 83.3 fL (ref 78.0–100.0)
Platelets: 172 10*3/uL (ref 150–400)
RBC: 3.96 MIL/uL — ABNORMAL LOW (ref 4.22–5.81)
RDW: 17.8 % — AB (ref 11.5–15.5)
WBC: 6.8 10*3/uL (ref 4.0–10.5)

## 2015-09-27 LAB — BASIC METABOLIC PANEL
ANION GAP: 3 — AB (ref 5–15)
BUN: 9 mg/dL (ref 6–20)
CO2: 26 mmol/L (ref 22–32)
Calcium: 8 mg/dL — ABNORMAL LOW (ref 8.9–10.3)
Chloride: 115 mmol/L — ABNORMAL HIGH (ref 101–111)
Creatinine, Ser: 1.11 mg/dL (ref 0.61–1.24)
GFR calc Af Amer: >60 mL/min (ref >60)
GFR calc non Af Amer: >60 mL/min (ref >60)
GLUCOSE: 140 mg/dL — AB (ref 65–99)
POTASSIUM: 3.7 mmol/L (ref 3.5–5.1)
Sodium: 144 mmol/L (ref 135–145)

## 2015-09-27 LAB — GLUCOSE, CAPILLARY
GLUCOSE-CAPILLARY: 163 mg/dL — AB (ref 65–99)
Glucose-Capillary: 131 mg/dL — ABNORMAL HIGH (ref 65–99)
Glucose-Capillary: 126 mg/dL — ABNORMAL HIGH (ref 65–99)
Glucose-Capillary: 226 mg/dL — ABNORMAL HIGH (ref 65–99)

## 2015-09-27 LAB — HEMOGLOBIN A1C
Hgb A1c MFr Bld: 7 % — ABNORMAL HIGH (ref 4.8–5.6)
Mean Plasma Glucose: 154 mg/dL

## 2015-09-27 LAB — PLATELET INHIBITION P2Y12: Platelet Function  P2Y12: 267 [PRU] (ref 194–418)

## 2015-09-27 LAB — HEPARIN LEVEL (UNFRACTIONATED)
HEPARIN UNFRACTIONATED: 0.15 [IU]/mL — AB (ref 0.30–0.70)
Heparin Unfractionated: 0.2 IU/mL — ABNORMAL LOW (ref 0.30–0.70)

## 2015-09-27 MED ORDER — LOSARTAN POTASSIUM 50 MG PO TABS
100.0000 mg | ORAL_TABLET | Freq: Every day | ORAL | Status: DC
  Administered 2015-09-27 – 2015-10-01 (×5): 100 mg via ORAL
  Filled 2015-09-27 (×6): qty 2

## 2015-09-27 NOTE — Progress Notes (Signed)
Phone call to Dr. [name] - in his office - relayed episode/ details of chest pain while ambulating 350 ft with cardiac rehab. Informed chest pain resolved following NGT x 2, currently resting chair at bedside with legs elevated and is w/o pain, discomfort or complaint. Dr. [name] stated he is not to be ambulating, to d/c cardiac rehab phase 1 orders, continue bathroom privileges. D/C cardiac rehab order.

## 2015-09-27 NOTE — Progress Notes (Signed)
ANTICOAGULATION CONSULT NOTE - Follow Up Consult  Pharmacy Consult for Heparin  Indication: 3v disease s/p cath   Allergies  Allergen Reactions  . Actos [Pioglitazone]     Itching, rash   . Levaquin [Levofloxacin] Itching and Rash  . Lisinopril Itching and Rash    Patient Measurements: Height: 5\' 6"  (167.6 cm) Weight: 163 lb 12.8 oz (74.3 kg) IBW/kg (Calculated) : 63.8  Vital Signs: Temp: 97.7 F (36.5 C) (01/12 1343) Temp Source: Oral (01/12 1343) BP: 140/85 mmHg (01/12 1530) Pulse Rate: 96 (01/12 1530)  Labs:  Recent Labs  09/24/15 2137 09/25/15 0209  09/25/15 0800  09/26/15 0313 09/27/15 0334 09/27/15 0755 09/27/15 1950  HGB  --   --   < > 11.0*  --  10.7* 10.6*  --   --   HCT  --   --   --  34.1*  --  32.5* 33.0*  --   --   PLT  --   --   --  166  --  170 172  --   --   LABPROT  --   --   --   --   --  15.7*  --   --   --   INR  --   --   --   --   --  1.24  --   --   --   HEPARINUNFRC  --  0.32  --   --   < > 0.32  --  0.20* 0.15*  CREATININE 1.74*  --   --  1.39*  --  1.11 1.11  --   --   TROPONINI 1.47* 2.46*  --  1.99*  --   --   --   --   --   < > = values in this interval not displayed.  Estimated Creatinine Clearance: 55.1 mL/min (by C-G formula based on Cr of 1.11).   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 CVTS consulted and plans for CABG on 1/17. Heparin level 0.15 remains subtherapeutic after rate increased to 1000 units/hr this morning. Per RN saline [facility] has been infusing the same line with heparin, not sure how much it affected the actual heparin rate. Evening shift RN will separate the line.  Goal of Therapy:  Heparin level 0.3-0.7 units/ml Monitor platelets by anticoagulation protocol: Yes   Plan:  -Increase heparin to 1100 units/hr -f/u AM heparin level -Monitor daily HL, CBC and s/s of bleeding -F/u CVTS timing of CABG  Maryanna Shape, PharmD, BCPS  Clinical Pharmacist  Pager: [phone]   09/27/2015 8:36  PM

## 2015-09-27 NOTE — Progress Notes (Signed)
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 Plan CABG first available OR time  Objective: Vital signs in last 24 hours: Temp:  [97.6 F (36.4 C)-98.3 F (36.8 C)] 98 F (36.7 C) (01/12 0554) Pulse Rate:  [0-87] 52 (01/12 0554) Cardiac Rhythm:  [-] Normal sinus rhythm (01/11 1917) Resp:  [0-20] 18 (01/12 0554) BP: (113-183)/(50-91) 156/73 mmHg (01/12 0554) SpO2:  [0 %-100 %] 100 % (01/12 0554) Weight:  [163 lb 12.8 oz (74.3 kg)] 163 lb 12.8 oz (74.3 kg) (01/12 0554)  Hemodynamic parameters for last 24 hours:    Intake/Output from previous day: 01/11 0701 - 01/12 0700 In: 993.4 [P.O.:360; I.V.:633.4] Out: 851 [Urine:850; Stool:1] Intake/Output this shift:  alert and comfortable Lungs clear No hematoma at cath site    Lab Results:  Recent Labs  09/26/15 0313 09/27/15 0334  WBC 6.5 6.8  HGB 10.7* 10.6*  HCT 32.5* 33.0*  PLT 170 172   BMET:  Recent Labs  09/26/15 0313 09/27/15 0334  NA 141 144  K 3.8 3.7  CL 112* 115*  CO2 23 26  GLUCOSE 234* 140*  BUN 17 9  CREATININE 1.11 1.11  CALCIUM 8.0* 8.0*    PT/INR:  Recent Labs  09/26/15 0313  LABPROT 15.7*  INR 1.24   ABG    Component Value Date/Time   PHART 7.431 03/01/2008 1713   HCO3 22.7 03/01/2008 1713   TCO2 20.6 03/01/2008 1713   ACIDBASEDEF 0.7 03/01/2008 1713   O2SAT 47.6 03/01/2008 1713   CBG (last 3)   Recent Labs  09/26/15 1609 09/26/15 2106 09/27/15 0648  GLUCAP 86 176* 131*    Assessment/Plan: S/P Procedure(s) (LRB): Left Heart Cath and Coronary Angiography (N/A) CABG next tues am Procedure d/w patient   LOS: 3 days    Tharon Aquas Trigt III 09/27/2015

## 2015-09-27 NOTE — Progress Notes (Signed)
Patient states the nurse "just in here and gave me this book.... Set up and appointment for 1:30 PM, my wife told me it needs to be before 11:00." Could you let her know.

## 2015-09-27 NOTE — Progress Notes (Signed)
ANTICOAGULATION CONSULT NOTE - Follow Up Consult  Pharmacy Consult for Heparin  Indication: 3v disease s/p cath   Allergies  Allergen Reactions  . Actos [Pioglitazone]     Itching, rash   . Levaquin [Levofloxacin] Itching and Rash  . Lisinopril Itching and Rash    Patient Measurements: Height: 5\' 6"  (167.6 cm) Weight: 163 lb 12.8 oz (74.3 kg) IBW/kg (Calculated) : 63.8  Vital Signs: Temp: 98 F (36.7 C) (01/12 0554) Temp Source: Oral (01/12 0554) BP: 156/73 mmHg (01/12 0554) Pulse Rate: 52 (01/12 0554)  Labs:  Recent Labs  09/24/15 2137  09/25/15 0209 09/25/15 0800 09/25/15 1030 09/26/15 0313 09/27/15 0334 09/27/15 0755  HGB  --   --   --  11.0*  --  10.7* 10.6*  --   HCT  --   --   --  34.1*  --  32.5* 33.0*  --   PLT  --   --   --  166  --  170 172  --   LABPROT  --   --   --   --   --  15.7*  --   --   INR  --   --   --   --   --  1.24  --   --   HEPARINUNFRC  --   < > 0.32  --  0.43 0.32  --  0.20*  CREATININE 1.74*  --   --  1.39*  --  1.11 1.11  --   TROPONINI 1.47*  --  2.46* 1.99*  --   --   --   --   < > = values in this interval not displayed.  Estimated Creatinine Clearance: 55.1 mL/min (by C-G formula based on Cr of 1.11).   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 CVTS consulted and plans for CABG at earliest available OR time. Resumed on heparin 8 hours post sheath removal. First level this morning a little low at 0.2units/mL. No issues with line or infusion per discussion with RN. Hgb 10.6, plts 172- no bleeding noted  Goal of Therapy:  Heparin level 0.3-0.7 units/ml Monitor platelets by anticoagulation protocol: Yes   Plan:  -increase heparin to 1000 units/hr -8 hr HL  -Monitor daily HL, CBC and s/s of bleeding -F/u CVTS timing of CABG  Callan Yontz D. Nashley Cordoba, PharmD, BCPS Clinical Pharmacist Pager: [phone] 09/27/2015 9:41 AM

## 2015-09-27 NOTE — Progress Notes (Signed)
Patient sitting on side of bed, no pain or distress. Call light within reach.

## 2015-09-27 NOTE — Care Management Important Message (Signed)
Important Message  Patient Details  Name: Fernando Hess MRN: [id number] Date of Birth: [birth year]   Medicare Important Message Given:  Yes    Nathen May 09/27/2015, 12:22 PM

## 2015-09-27 NOTE — Consult Note (Addendum)
[address]       [place] [address]             [phone]        Fernando Hess [place] Medical Record [id number] Date of Birth: 1946/07/02  Referring: No ref. provider found Fernando Hess M.D. Primary Care:  Melinda Crutch, MD  Chief Complaint:    Chief Complaint  Patient presents with  . Chest Pain  . Abnormal Lab   patient examined, coronary angiograms and echocardiogram personally reviewed  History of Present Illness:     70 year old Caucasian male diabetic nonsmoker presents with unstable angina and positive cardiac enzymes. The patient states he had a cardiac catheterization between 1 and 2 years ago which showed moderate disease and he was treated medically. His symptoms of unstable angina including bilateral neck and jaw pain recurred on January 9 and he was admitted through the emergency department and placed on IV heparin and nitroglycerin. He had nonspecific ST segment changes and positive cardiac enzymes. Peak troponin 2.5 Echocardiogram showed mild-moderate LV dysfunction with EF 45%. Yesterday he underwent cardiac catheterization by Dr. Claiborne Billings which showed severe three-vessel coronary disease. Ejection fraction 45%. LVEDP 10 mmHg Multivessel CABG was recommended his best therapeutic option.  The patient has hypertension, hyperlipidemia, and diabetes mellitus. No family history of CABG. No history of thoracic trauma or recent significant surgical procedures. He is retired.   Current Activity/ Functional Status: Patient is retired. He does not smoke but drinks alcohol socially. He is able to perform his ADLs independently.   Zubrod Score: At the time of surgery this patient's most appropriate activity status/level should be described as: []     0    Normal activity, no symptoms []     1    Restricted in physical strenuous activity but ambulatory, able to do out light work [x]     2    Ambulatory and capable of self care, unable to do work  activities, up and about                 more than 50%  Of the time                            []     3    Only limited self care, in bed greater than 50% of waking hours []     4    Completely disabled, no self care, confined to bed or chair []     5    Moribund  Past Medical History  Diagnosis Date  . Diabetes mellitus without complication ([place])   . Hypertension   . Hyperlipidemia   . Kidney stones   . CAD (coronary artery disease), native coronary artery 09/24/2015  . History of kidney stones     Past Surgical History  Procedure Laterality Date  . Cataract extraction, bilateral    . Leg surgery  70 yrs old    from fx. right  . Appendectomy    . Left heart catheterization with coronary angiogram N/A 04/13/2014    Procedure: LEFT HEART CATHETERIZATION WITH CORONARY ANGIOGRAM;  Surgeon: Jacolyn Reedy, MD;  Location: [facility] CATH LAB;  Service: Cardiovascular;  Laterality: N/A;  . Cardiac catheterization N/A 09/26/2015    Procedure: Left Heart Cath and Coronary Angiography;  Surgeon: Troy Sine, MD;  Location: [facility] CV LAB;  Service: Cardiovascular;  Laterality: N/A;    History  Smoking  status  . Never Smoker   Smokeless tobacco  . Never Used    History  Alcohol Use  . Yes    Comment: 1 beer/wine every 2-3 months per patient    Social History   Social History  . Marital Status: Married    Spouse Name: N/A  . Number of Children: N/A  . Years of Education: N/A   Occupational History  . Not on file.   Social History Main Topics  . Smoking status: Never Smoker   . Smokeless tobacco: Never Used  . Alcohol Use: Yes     Comment: 1 beer/wine every 2-3 months per patient  . Drug Use: No  . Sexual Activity: Not on file   Other Topics Concern  . Not on file   Social History Narrative    Allergies  Allergen Reactions  . Actos [Pioglitazone]     Itching, rash   . Levaquin [Levofloxacin] Itching and Rash  . Lisinopril Itching and Rash    Current  Facility-Administered Medications  Medication Dose Route Frequency Provider Last Rate Last Dose  . 0.9 %  sodium chloride infusion  250 mL Intravenous PRN Troy Sine, MD 10 mL/hr at 09/27/15 1328 250 mL at 09/27/15 1328  . acetaminophen (TYLENOL) tablet 650 mg  650 mg Oral Q4H PRN Jacolyn Reedy, MD      . acetaminophen (TYLENOL) tablet 650 mg  650 mg Oral Q4H PRN Troy Sine, MD      . aspirin chewable tablet 81 mg  81 mg Oral Daily Jacolyn Reedy, MD   81 mg at 09/27/15 1331  . atorvastatin (LIPITOR) tablet 40 mg  40 mg Oral Daily Jacolyn Reedy, MD   40 mg at 09/27/15 1332  . diazepam (VALIUM) tablet 5 mg  5 mg Oral Q6H PRN Troy Sine, MD      . glimepiride (AMARYL) tablet 8 mg  8 mg Oral Q breakfast Jacolyn Reedy, MD   8 mg at 09/27/15 0806  . heparin ADULT infusion 100 units/mL (25000 units/250 mL)  1,000 Units/hr Intravenous Continuous Lauren D Bajbus, RPH 10 mL/hr at 09/27/15 1229 1,000 Units/hr at 09/27/15 1229  . insulin aspart (novoLOG) injection 0-15 Units  0-15 Units Subcutaneous TID WC Jacolyn Reedy, MD   2 Units at 09/27/15 1336  . losartan (COZAAR) tablet 100 mg  100 mg Oral Daily Jacolyn Reedy, MD   100 mg at 09/27/15 1330  . metoprolol succinate (TOPROL-XL) 24 hr tablet 25 mg  25 mg Oral Daily Jacolyn Reedy, MD   25 mg at 09/27/15 1334  . nitroGLYCERIN (NITROSTAT) SL tablet 0.4 mg  0.4 mg Sublingual Q5 min PRN Jacolyn Reedy, MD      . nitroGLYCERIN 50 mg in dextrose 5 % 250 mL (0.2 mg/mL) infusion  2-200 mcg/min Intravenous Titrated Jacolyn Reedy, MD 0.6 mL/hr at 09/24/15 1836 2 mcg/min at 09/24/15 1836  . ondansetron (ZOFRAN) injection 4 mg  4 mg Intravenous Q6H PRN Jacolyn Reedy, MD      . pantoprazole (PROTONIX) EC tablet 40 mg  40 mg Oral Daily Jacolyn Reedy, MD   40 mg at 09/27/15 1334  . sodium chloride 0.9 % injection 3 mL  3 mL Intravenous Q12H Troy Sine, MD   3 mL at 09/26/15 1900  . sodium chloride 0.9 % injection 3 mL  3 mL  Intravenous PRN Troy Sine, MD  Prescriptions prior to admission  Medication Sig Dispense Refill Last Dose  . acetaminophen (TYLENOL) 325 MG tablet Take 650 mg by mouth every 6 (six) hours as needed for mild pain.   09/23/2015 at Unknown time  . aspirin 81 MG chewable tablet Chew by mouth daily.   09/24/2015 at Unknown time  . atorvastatin (LIPITOR) 40 MG tablet Take 40 mg by mouth daily.   09/23/2015 at Unknown time  . clopidogrel (PLAVIX) 75 MG tablet Take 75 mg by mouth daily.   09/23/2015 at Unknown time  . glimepiride (AMARYL) 4 MG tablet Take 8 mg by mouth daily with breakfast. Takes 1 and 1/2 tablets by mouth daily with breakfast   09/23/2015 at Unknown time  . Liraglutide (VICTOZA) 18 MG/3ML SOPN Inject 0.6 mLs into the skin daily. Wife states doctor has taken him off of medication, because of possible "Allergic Reaction" However patient states he is still taking medication   09/23/2015 at Unknown time  . losartan (COZAAR) 100 MG tablet Take 100 mg by mouth daily.   09/23/2015 at Unknown time  . metFORMIN (GLUCOPHAGE) 500 MG tablet Take 2 tablets (1,000 mg total) by mouth 2 (two) times daily with a meal. Begin taking on Saturday morning   09/23/2015 at Unknown time  . metoprolol succinate (TOPROL-XL) 25 MG 24 hr tablet Take 25 mg by mouth daily.   09/23/2015 at 0800  . Multiple Vitamin (MULTIVITAMIN) tablet Take 1 tablet by mouth daily.   09/23/2015 at Unknown time  . nitroGLYCERIN (NITROSTAT) 0.4 MG SL tablet Place 0.4 mg under the tongue every 5 (five) minutes as needed for chest pain.   unknown at unknown  . ondansetron (ZOFRAN-ODT) 4 MG disintegrating tablet Take 4 mg by mouth every 8 (eight) hours as needed for nausea or vomiting. Pt states he takes at home when he feels nauseous   09/23/2015 at Unknown time  . pantoprazole (PROTONIX) 40 MG tablet Take 40 mg by mouth daily.   09/23/2015 at Unknown time  . ONE TOUCH ULTRA TEST test strip    Taking    Family History  Problem Relation Age of Onset    . Heart attack Mother   . Heart attack Father   . Diabetes Brother   . Pancreatic cancer Brother   . Colon cancer Neg Hx   . Esophageal cancer Neg Hx   . Prostate cancer Neg Hx   . Rectal cancer Neg Hx   . Stomach cancer Neg Hx   . Diabetes Brother      Review of Systems:       Cardiac Review of Systems: Y or N  Chest Pain [ yes   ]  Resting SOB [ L  ] Exertional SOB  [ no ]  Orthopnea [no  ]   Pedal Edema [ no  ]    Palpitations Totoro.Blacker  ] Syncope  [no  ]   Presyncope [no   ]  General Review of Systems: [Y] = yes [  ]=no Constitional: recent weight change [place].Blacker  ]; anorexia [  ]; fatigue [  ]; nausea [place].Blacker  ]; night sweats [  ]; fever [  ]; or chills [  ]  Dental: poor dentition[  ]; Last Dentist visit: 6 months  Eye : blurred vision [  ]; diplopia [   ]; vision changes [  ];  Amaurosis fugax[  ]; Resp: cough [  ];  wheezing[  ];  hemoptysis[  ]; shortness of breath[  ]; paroxysmal nocturnal dyspnea[  ]; dyspnea on exertion[ y ]; or orthopnea[  ];  GI:  gallstones[  ], vomiting[yes  ];  dysphagia[  ]; melena[  ];  hematochezia [  ]; heartburn[yes  ];   Hx of  Colonoscopy[  ]; GU: kidney stones (yes remote]; hematuria[  ];   dysuria Blue.Reese  ];  nocturia[  ];  history of     obstruction [  ]; urinary frequency [  ]             Skin: rash, swelling[  ];, hair loss[  ];  peripheral edema[  ];  or itching[  ]; Musculosketetal: myalgias[  ];  joint swelling[  ];  joint erythema[  ];  joint pain[  ];  back pain[  ];  Heme/Lymph: bruising[  ];  bleeding[  ];  anemia[  ];  Neuro: TIA[  ];  headaches[ yes ];  stroke[  ];  vertigo[  ];  seizures[  ];   paresthesias[  ];  difficulty walking[  ];  Psych:depression[  ]; anxiety[ yes ];  Endocrine: diabetes[yes  ];  thyroid dysfunction[  ];  Immunizations: Flu [  ]; Pneumococcal[  ];  Other: Right-hand dominant  Physical Exam: BP 129/77 mmHg  Pulse 50  Temp(Src) 97.7 F (36.5 C) (Oral)   Resp 18  Ht 5\' 6"  (1.676 m)  Wt 163 lb 12.8 oz (74.3 kg)  BMI 26.45 kg/m2  SpO2 100%       Physical Exam  General: Anxious but well-nourished male no acute distress HEENT: Normocephalic pupils equal , dentition adequate Neck: Supple without JVD, adenopathy, or bruit Chest: Clear to auscultation, symmetrical breath sounds, no rhonchi, no tenderness             or deformity Cardiovascular: Regular rate and rhythm, no murmur, no gallop, peripheral pulses             palpable in all extremities Abdomen:  Soft, nontender, no palpable mass or organomegaly Extremities: Warm, well-perfused, no clubbing cyanosis edema or tenderness,              no venous stasis changes of the legs Rectal/GU: Deferred Neuro: Grossly non--focal and symmetrical throughout Skin: Clean and dry without rash or ulceration   Diagnostic Studies & Laboratory data:     Recent Radiology Findings:   No results found.   I have independently reviewed the above radiologic studies. Chest x-ray is clear. Personally reviewed.  Recent Lab Findings: Lab Results  Component Value Date   WBC 6.8 09/27/2015   HGB 10.6* 09/27/2015   HCT 33.0* 09/27/2015   PLT 172 09/27/2015   GLUCOSE 140* 09/27/2015   CHOL 70 09/25/2015   TRIG 79 09/25/2015   HDL 22* 09/25/2015   LDLCALC 32 09/25/2015   ALT 20 09/24/2015   AST 25 09/24/2015   NA 144 09/27/2015   K 3.7 09/27/2015   CL 115* 09/27/2015   CREATININE 1.11 09/27/2015   BUN 9 09/27/2015   CO2 26 09/27/2015   TSH 0.745 09/24/2015   INR 1.24 09/26/2015   HGBA1C 7.0* 09/26/2015      Assessment / Plan:      70 year old diabetic nonsmoker with  unstable angina, positive troponin 2.5 with severe three-vessel coronary disease and mild-moderate LV dysfunction.  We'll schedule for multivessel CABG first available OR time-Tuesday a.m. January 17. Procedure discussed with patient and he agrees and understands plan.

## 2015-09-27 NOTE — Progress Notes (Signed)
Subjective:  No recurrent chest pain overnight.  Catheterization site is clean and dry.  Dr. Lucianne Lei Tright's note read and appreciated.  His PR assay actually shows a reasonable level of non-inhibition despite the fact that Plavix was stopped yesterday.  Objective:  Vital Signs in the last 24 hours: BP 156/73 mmHg  Pulse 52  Temp(Src) 98 F (36.7 C) (Oral)  Resp 18  Ht 5\' 6"  (1.676 m)  Wt 74.3 kg (163 lb 12.8 oz)  BMI 26.45 kg/m2  SpO2 100%  Physical Exam: Pleasant male in no acute distress Lungs:  Clear Cardiac:  Regular rhythm, normal S1 and S2, no S3 Extremities:  No edema present  Intake/Output from previous day: 01/11 0701 - 01/12 0700 In: 993.4 [P.O.:360; I.V.:633.4] Out: 851 [Urine:850; Stool:1]  Weight Filed Weights   09/25/15 0409 09/26/15 0327 09/27/15 0554  Weight: 71.3 kg (157 lb 3 oz) 73.2 kg (161 lb 6 oz) 74.3 kg (163 lb 12.8 oz)    Lab Results: Basic Metabolic Panel:  Recent Labs  09/26/15 0313 09/27/15 0334  NA 141 144  K 3.8 3.7  CL 112* 115*  CO2 23 26  GLUCOSE 234* 140*  BUN 17 9  CREATININE 1.11 1.11   CBC:  Recent Labs  09/26/15 0313 09/27/15 0334  WBC 6.5 6.8  HGB 10.7* 10.6*  HCT 32.5* 33.0*  MCV 83.8 83.3  PLT 170 172   Cardiac Enzymes: Troponin (Point of Care Test)  Recent Labs  09/24/15 1529  TROPIPOC 0.28*   Cardiac Panel (last 3 results)  Recent Labs  09/24/15 2137 09/25/15 0209 09/25/15 0800  TROPONINI 1.47* 2.46* 1.99*    Telemetry: Sinus rhythm  Assessment/Plan:  1.  Non-STEMI 2.  Significant three-vessel coronary artery disease predominantly in LAD and circumflex 3.  Non-insulin-dependent diabetes mellitus with symptoms on the toes and 4.  Acute renal failure resolved  Recommendations:  Echocardiogram yesterday showed an EF of about 45% with some anterior lateral hypokinesis.  CABG is planned when surgery schedule is available.  PRU assay suggests that he may have an of platelet function to proceed  with surgery earlier than Tuesday if necessary.  I'm going to discontinue his hydration as his renal function is now normal.  Good diabetic control.  Will stop Victoza for now.  Restart ARB.   Kerry Hough  MD [facility]  09/27/2015, 8:56 AM

## 2015-09-27 NOTE — Progress Notes (Addendum)
CARDIAC REHAB PHASE I   PRE:  Rate/Rhythm: 74 SR  BP:  Sitting: 153/71        SaO2: 98 RA  MODE:  Ambulation: 350 ft   POST:  Rate/Rhythm: 87 SR  BP:  Sitting: 174/72         SaO2: 100 RA  Pt ambulated 350 ft on RA, IV, assist x1, steady gait tolerated well. Pt denies CP, dizziness, DOE with ambulation, declined rest stop. Upon returning to room, pt sat in recliner, began complaining of 8/10 sharp L sided chest pain, states it "feels like a knife stabbing me." Attempted to notify RN, RN off the floor, RN listening for pt in contact room, pt states pain subsided after 2 minutes of rest. Gave pt cardiac surgery book and cardiac surgery guidelines, will complete pre-op education when wife at bedside. Prior to leaving, pt c/o sharp L sided chest pain again, states it is not subsiding, states "I think I need the little white pill." Pt very anxious. Charge RN notified, at bedside, charge RN administering nitroglycerin, performing EKG. Will follow.   [id number] Lenna Sciara, RN, BSN 09/27/2015 3:29 PM

## 2015-09-28 ENCOUNTER — Inpatient Hospital Stay (HOSPITAL_COMMUNITY): Payer: Medicare Other

## 2015-09-28 DIAGNOSIS — I2510 Atherosclerotic heart disease of native coronary artery without angina pectoris: Secondary | ICD-10-CM

## 2015-09-28 LAB — GLUCOSE, CAPILLARY
GLUCOSE-CAPILLARY: 118 mg/dL — AB (ref 65–99)
GLUCOSE-CAPILLARY: 164 mg/dL — AB (ref 65–99)
GLUCOSE-CAPILLARY: 156 mg/dL — AB (ref 65–99)
Glucose-Capillary: 191 mg/dL — ABNORMAL HIGH (ref 65–99)

## 2015-09-28 LAB — CBC
HCT: 31.6 % — ABNORMAL LOW (ref 39.0–52.0)
Hemoglobin: 10.4 g/dL — ABNORMAL LOW (ref 13.0–17.0)
MCH: 27.5 pg (ref 26.0–34.0)
MCHC: 32.9 g/dL (ref 30.0–36.0)
MCV: 83.6 fL (ref 78.0–100.0)
PLATELETS: 164 10*3/uL (ref 150–400)
RBC: 3.78 MIL/uL — ABNORMAL LOW (ref 4.22–5.81)
RDW: 18.1 % — AB (ref 11.5–15.5)
WBC: 7.1 10*3/uL (ref 4.0–10.5)

## 2015-09-28 LAB — HEPARIN LEVEL (UNFRACTIONATED)
Heparin Unfractionated: 0.34 IU/mL (ref 0.30–0.70)
Heparin Unfractionated: 0.38 IU/mL (ref 0.30–0.70)

## 2015-09-28 MED ORDER — METOPROLOL SUCCINATE ER 50 MG PO TB24
50.0000 mg | ORAL_TABLET | Freq: Every day | ORAL | Status: DC
  Administered 2015-09-28 – 2015-10-01 (×3): 50 mg via ORAL
  Filled 2015-09-28 (×4): qty 1

## 2015-09-28 MED ORDER — AMLODIPINE BESYLATE 5 MG PO TABS
2.5000 mg | ORAL_TABLET | Freq: Every day | ORAL | Status: DC
  Administered 2015-09-28 – 2015-10-01 (×4): 2.5 mg via ORAL
  Filled 2015-09-28 (×4): qty 1

## 2015-09-28 NOTE — Progress Notes (Signed)
Pre-op Cardiac Surgery  Carotid Findings:  Findings suggest 1-39% internal carotid artery stenosis bilaterally. Vertebral arteries are patent with antegrade flow.  Upper Extremity Right Left  Brachial Pressures 141-Triphasic 132-Triphasic  Radial Waveforms Triphasic Triphasic  Ulnar Waveforms Triphasic Triphasic  Palmar Arch (Allen's Test) Within normal limits Within normal limits    Lower  Extremity Right Left  Dorsalis Pedis 15-Triphasic 163-Triphasic  Anterior Tibial    Posterior Tibial 173-Triphasic 167-Triphasic  Ankle/Brachial Indices 1.23 1.18    Findings:   ABIs are within normal limits bilaterally  09/28/2015 2:02 PM Maudry Mayhew, RVT, RDCS, RDMS

## 2015-09-28 NOTE — Care Management Note (Signed)
Case Management Note  Patient Details  Name: Fernando Hess MRN: [id number] Date of Birth: 02-09-1946  Subjective/Objective:       Pt admitted with NON STEMI, pt is scheduled for CABG 10/02/15             Action/Plan:  Pt is independent from home with wife.  Wife will provide 24 hour supervision post discharge.  CM will continue to monitor for disposition needs   Expected Discharge Date:  09/26/15               Expected Discharge Plan:  Home/Self Care  In-House Referral:     Discharge planning Services  CM Consult  Post Acute Care Choice:    Choice offered to:     DME Arranged:    DME Agency:     HH Arranged:    HH Agency:     Status of Service:  In process, will continue to follow  Medicare Important Message Given:  Yes Date Medicare IM Given:    Medicare IM give by:    Date Additional Medicare IM Given:    Additional Medicare Important Message give by:     If discussed at [place] of Stay Meetings, dates discussed:    Additional Comments:  Maryclare Labrador, RN 09/28/2015, 3:48 PM

## 2015-09-28 NOTE — Progress Notes (Signed)
ANTICOAGULATION CONSULT NOTE - Follow Up Consult  Pharmacy Consult for Heparin  Indication: 3v disease s/p cath   Allergies  Allergen Reactions  . Actos [Pioglitazone]     Itching, rash   . Levaquin [Levofloxacin] Itching and Rash  . Lisinopril Itching and Rash    Patient Measurements: Height: 5\' 6"  (167.6 cm) Weight: 163 lb 8 oz (74.163 kg) IBW/kg (Calculated) : 63.8  Vital Signs: Temp: 98.3 F (36.8 C) (01/13 0522) Temp Source: Oral (01/13 0522) BP: 161/73 mmHg (01/13 0522) Pulse Rate: 57 (01/13 0522)  Labs:  Recent Labs  09/26/15 0313 09/27/15 0334  09/27/15 1950 09/28/15 0325 09/28/15 1116  HGB 10.7* 10.6*  --   --  10.4*  --   HCT 32.5* 33.0*  --   --  31.6*  --   PLT 170 172  --   --  164  --   LABPROT 15.7*  --   --   --   --   --   INR 1.24  --   --   --   --   --   HEPARINUNFRC 0.32  --   < > 0.15* 0.34 0.38  CREATININE 1.11 1.11  --   --   --   --   < > = values in this interval not displayed.  Estimated Creatinine Clearance: 55.1 mL/min (by C-G formula based on Cr of 1.11).   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 Plans for CABG on 1/17. Heparin level therapeutic (0.38) on 1100 units/hr.   Goal of Therapy:  Heparin level 0.3-0.7 units/ml Monitor platelets by anticoagulation protocol: Yes   Plan:  -Continue heparin at 1100 units/hr -Daily heparin level and CBC  Hildred Laser, Pharm D 09/28/2015 1:14 PM

## 2015-09-28 NOTE — Progress Notes (Signed)
Subjective:  Blood pressure has been running up a little bit.  He had significant chest discomfort yesterday when cardiac rehabilitation and ambulated him 350 feet which provoked angina.  No angina since then.  Objective:  Vital Signs in the last 24 hours: BP 161/73 mmHg  Pulse 57  Temp(Src) 98.3 F (36.8 C) (Oral)  Resp 16  Ht 5\' 6"  (1.676 m)  Wt 74.163 kg (163 lb 8 oz)  BMI 26.40 kg/m2  SpO2 100%  Physical Exam: Pleasant male in no acute distress Lungs:  Clear Cardiac:  Regular rhythm, normal S1 and S2, no S3 Extremities:  No edema present  Intake/Output from previous day: 01/12 0701 - 01/13 0700 In: 1203 [P.O.:1200; I.V.:3] Out: 1900 [Urine:1900]  Weight Filed Weights   09/26/15 0327 09/27/15 0554 09/28/15 0522  Weight: 73.2 kg (161 lb 6 oz) 74.3 kg (163 lb 12.8 oz) 74.163 kg (163 lb 8 oz)    Lab Results: Basic Metabolic Panel:  Recent Labs  09/26/15 0313 09/27/15 0334  NA 141 144  K 3.8 3.7  CL 112* 115*  CO2 23 26  GLUCOSE 234* 140*  BUN 17 9  CREATININE 1.11 1.11   CBC:  Recent Labs  09/27/15 0334 09/28/15 0325  WBC 6.8 7.1  HGB 10.6* 10.4*  HCT 33.0* 31.6*  MCV 83.3 83.6  PLT 172 164   Telemetry: Sinus rhythm  Assessment/Plan:  1.  Non-STEMI 2.  Significant three-vessel coronary artery disease predominantly in LAD and circumflex with angina provoked by cardiac rehabilitation yesterday.   3.  Non-insulin-dependent diabetes mellitus with symptoms on the toes and 4.  Acute renal failure resolved 5.  Hypertension not well controlled  Recommendations:  Cardiac rehabilitation provoked angina yesterday.  I canceled this.  Blood pressure up a little bit.  Losartan was restarted.  Will add some amlodipine to help with blood pressure as well as angina.  If recurrent angina will need to restart nitroglycerin.   Kerry Hough  MD [facility]  09/28/2015, 8:29 AM

## 2015-09-28 NOTE — Progress Notes (Signed)
ANTICOAGULATION CONSULT NOTE - Follow Up Consult  Pharmacy Consult for Heparin  Indication: 3v disease s/p cath   Allergies  Allergen Reactions  . Actos [Pioglitazone]     Itching, rash   . Levaquin [Levofloxacin] Itching and Rash  . Lisinopril Itching and Rash    Patient Measurements: Height: 5\' 6"  (167.6 cm) Weight: 163 lb 12.8 oz (74.3 kg) IBW/kg (Calculated) : 63.8  Vital Signs: Temp: 98.9 F (37.2 C) (01/12 2042) Temp Source: Oral (01/12 2042) BP: 118/64 mmHg (01/12 2042) Pulse Rate: 74 (01/12 2042)  Labs:  Recent Labs  09/25/15 0800  09/26/15 0313 09/27/15 0334 09/27/15 0755 09/27/15 1950 09/28/15 0325  HGB 11.0*  --  10.7* 10.6*  --   --   --   HCT 34.1*  --  32.5* 33.0*  --   --   --   PLT 166  --  170 172  --   --   --   LABPROT  --   --  15.7*  --   --   --   --   INR  --   --  1.24  --   --   --   --   HEPARINUNFRC  --   < > 0.32  --  0.20* 0.15* 0.34  CREATININE 1.39*  --  1.11 1.11  --   --   --   TROPONINI 1.99*  --   --   --   --   --   --   < > = values in this interval not displayed.  Estimated Creatinine Clearance: 55.1 mL/min (by C-G formula based on Cr of 1.11).   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 Plans for CABG on 1/17. Heparin level therapeutic (0.34) on 1100 units/hr. No bleeding noted.   Goal of Therapy:  Heparin level 0.3-0.7 units/ml Monitor platelets by anticoagulation protocol: Yes   Plan:  -Continue heparin at 1100 units/hr -F/u 6 hr confirmatory level  Sherlon Handing, PharmD, BCPS Clinical pharmacist, pager [phone] 09/28/2015 4:57 AM

## 2015-09-28 NOTE — Progress Notes (Signed)
UR Completed. Corrina Steffensen, RN, BSN.  [phone] 

## 2015-09-28 NOTE — Progress Notes (Signed)
CARDIAC REHAB PHASE I   Pre-op education completed with pt and wife at bedside. Reviewed IS (repiratory therapy to provide pt with IS), sternal precautions, activity progression, cardiac surgery booklet and cardiac surgery guidelines. Provided instructions to view cardiac surgery videos. Pt and wife verbalized understanding. Pt in recliner, eating lunch, call bell within reach. Will follow post-op.   [phone] Lenna Sciara, RN, BSN 09/28/2015 12:28 PM

## 2015-09-29 ENCOUNTER — Other Ambulatory Visit: Payer: Self-pay

## 2015-09-29 DIAGNOSIS — R7989 Other specified abnormal findings of blood chemistry: Secondary | ICD-10-CM

## 2015-09-29 DIAGNOSIS — I214 Non-ST elevation (NSTEMI) myocardial infarction: Principal | ICD-10-CM

## 2015-09-29 LAB — GLUCOSE, CAPILLARY
GLUCOSE-CAPILLARY: 222 mg/dL — AB (ref 65–99)
Glucose-Capillary: 140 mg/dL — ABNORMAL HIGH (ref 65–99)
Glucose-Capillary: 142 mg/dL — ABNORMAL HIGH (ref 65–99)
Glucose-Capillary: 162 mg/dL — ABNORMAL HIGH (ref 65–99)

## 2015-09-29 LAB — CBC
HCT: 32.6 % — ABNORMAL LOW (ref 39.0–52.0)
Hemoglobin: 10.6 g/dL — ABNORMAL LOW (ref 13.0–17.0)
MCH: 27.2 pg (ref 26.0–34.0)
MCHC: 32.5 g/dL (ref 30.0–36.0)
MCV: 83.6 fL (ref 78.0–100.0)
PLATELETS: 178 10*3/uL (ref 150–400)
RBC: 3.9 MIL/uL — AB (ref 4.22–5.81)
RDW: 18.3 % — AB (ref 11.5–15.5)
WBC: 8.2 10*3/uL (ref 4.0–10.5)

## 2015-09-29 LAB — HEPARIN LEVEL (UNFRACTIONATED): HEPARIN UNFRACTIONATED: 0.37 [IU]/mL (ref 0.30–0.70)

## 2015-09-29 NOTE — Progress Notes (Signed)
Subjective:  Had NTG responsive CP yesterday with ambulation, none today  Objective:  Temp:  [98.1 F (36.7 C)-98.4 F (36.9 C)] 98.4 F (36.9 C) (01/14 0431) Pulse Rate:  [54-72] 72 (01/14 0431) Resp:  [16-18] 16 (01/14 0431) BP: (113-137)/(59-72) 137/72 mmHg (01/14 0431) SpO2:  [97 %-100 %] 97 % (01/14 0431) Weight:  [163 lb 6.4 oz (74.118 kg)] 163 lb 6.4 oz (74.118 kg) (01/14 0900) Weight change:   Intake/Output from previous day: 01/13 0701 - 01/14 0700 In: 2766.9 [P.O.:1080; I.V.:1686.9] Out: 1750 [Urine:1750]  Intake/Output from this shift: Total I/O In: 240 [P.O.:240] Out: -   Physical Exam: General appearance: alert and no distress Neck: no adenopathy, no carotid bruit, no JVD, supple, symmetrical, trachea midline and thyroid not enlarged, symmetric, no tenderness/mass/nodules Lungs: clear to auscultation bilaterally Heart: regular rate and rhythm, S1, S2 normal, no murmur, click, rub or gallop Extremities: extremities normal, atraumatic, no cyanosis or edema  Lab Results: Results for orders placed or performed during the hospital encounter of 09/24/15 (from the past 48 hour(s))  Glucose, capillary     Status: Abnormal   Collection Time: 09/27/15  4:18 PM  Result Value Ref Range   Glucose-Capillary 226 (H) 65 - 99 mg/dL   Comment 1 Notify RN    Comment 2 Document in Chart   Heparin level (unfractionated)     Status: Abnormal   Collection Time: 09/27/15  7:50 PM  Result Value Ref Range   Heparin Unfractionated 0.15 (L) 0.30 - 0.70 IU/mL    Comment:        IF HEPARIN RESULTS ARE BELOW EXPECTED VALUES, AND PATIENT DOSAGE HAS BEEN CONFIRMED, SUGGEST FOLLOW UP TESTING OF ANTITHROMBIN III LEVELS.   Glucose, capillary     Status: Abnormal   Collection Time: 09/27/15  9:21 PM  Result Value Ref Range   Glucose-Capillary 163 (H) 65 - 99 mg/dL  CBC     Status: Abnormal   Collection Time: 09/28/15  3:25 AM  Result Value Ref Range   WBC 7.1 4.0 - 10.5  K/uL   RBC 3.78 (L) 4.22 - 5.81 MIL/uL   Hemoglobin 10.4 (L) 13.0 - 17.0 g/dL   HCT 31.6 (L) 39.0 - 52.0 %   MCV 83.6 78.0 - 100.0 fL   MCH 27.5 26.0 - 34.0 pg   MCHC 32.9 30.0 - 36.0 g/dL   RDW 18.1 (H) 11.5 - 15.5 %   Platelets 164 150 - 400 K/uL  Heparin level (unfractionated)     Status: None   Collection Time: 09/28/15  3:25 AM  Result Value Ref Range   Heparin Unfractionated 0.34 0.30 - 0.70 IU/mL    Comment:        IF HEPARIN RESULTS ARE BELOW EXPECTED VALUES, AND PATIENT DOSAGE HAS BEEN CONFIRMED, SUGGEST FOLLOW UP TESTING OF ANTITHROMBIN III LEVELS.   Glucose, capillary     Status: Abnormal   Collection Time: 09/28/15  6:52 AM  Result Value Ref Range   Glucose-Capillary 118 (H) 65 - 99 mg/dL  Heparin level (unfractionated)     Status: None   Collection Time: 09/28/15 11:16 AM  Result Value Ref Range   Heparin Unfractionated 0.38 0.30 - 0.70 IU/mL    Comment:        IF HEPARIN RESULTS ARE BELOW EXPECTED VALUES, AND PATIENT DOSAGE HAS BEEN CONFIRMED, SUGGEST FOLLOW UP TESTING OF ANTITHROMBIN III LEVELS.   Glucose, capillary     Status: Abnormal   Collection Time: 09/28/15 11:16  AM  Result Value Ref Range   Glucose-Capillary 191 (H) 65 - 99 mg/dL   Comment 1 Notify RN    Comment 2 Document in Chart   Glucose, capillary     Status: Abnormal   Collection Time: 09/28/15  4:29 PM  Result Value Ref Range   Glucose-Capillary 164 (H) 65 - 99 mg/dL   Comment 1 Notify RN    Comment 2 Document in Chart   Glucose, capillary     Status: Abnormal   Collection Time: 09/28/15  9:43 PM  Result Value Ref Range   Glucose-Capillary 156 (H) 65 - 99 mg/dL  CBC     Status: Abnormal   Collection Time: 09/29/15  4:06 AM  Result Value Ref Range   WBC 8.2 4.0 - 10.5 K/uL   RBC 3.90 (L) 4.22 - 5.81 MIL/uL   Hemoglobin 10.6 (L) 13.0 - 17.0 g/dL   HCT 32.6 (L) 39.0 - 52.0 %   MCV 83.6 78.0 - 100.0 fL   MCH 27.2 26.0 - 34.0 pg   MCHC 32.5 30.0 - 36.0 g/dL   RDW 18.3 (H) 11.5 -  15.5 %   Platelets 178 150 - 400 K/uL  Heparin level (unfractionated)     Status: None   Collection Time: 09/29/15  4:06 AM  Result Value Ref Range   Heparin Unfractionated 0.37 0.30 - 0.70 IU/mL    Comment:        IF HEPARIN RESULTS ARE BELOW EXPECTED VALUES, AND PATIENT DOSAGE HAS BEEN CONFIRMED, SUGGEST FOLLOW UP TESTING OF ANTITHROMBIN III LEVELS.   Glucose, capillary     Status: Abnormal   Collection Time: 09/29/15  6:31 AM  Result Value Ref Range   Glucose-Capillary 140 (H) 65 - 99 mg/dL  Glucose, capillary     Status: Abnormal   Collection Time: 09/29/15 11:14 AM  Result Value Ref Range   Glucose-Capillary 142 (H) 65 - 99 mg/dL   Comment 1 Notify RN    Comment 2 Document in Chart     Imaging: Imaging results have been reviewed  Tele- SR 60s  Assessment/Plan:   1. Principal Problem: 2.   Non-STEMI (non-ST elevated myocardial infarction) ([place]) 3. Active Problems: 4.   Diabetes mellitus type 2, noninsulin dependent ([place]) 5.   CAD (coronary artery disease), native coronary artery 6.   Acute renal failure (ARF) ([name]) 7.   Elevated troponin 8.   Time Spent Directly with Patient:  20  minutes  Length of Stay:  LOS: 5 days   Cath on Thurs by Dr Leda Gauze showed 3VD with mild-mod LV dysfunction. NSTEMI. Peak trop 2.5. On OV hep. Had nitrate responsive angina yesterday, none today. Labs OK. For CABG on Tuesday.    Fernando Hess 09/29/2015, 11:39 AM

## 2015-09-29 NOTE — Progress Notes (Signed)
3 Days Post-Op Procedure(s) (LRB): Left Heart Cath and Coronary Angiography (N/A) Subjective: Patient comfortable without chest pain today on IV heparin Visiting with family in room Pre-CABG Dopplers reviewed and are satisfactory Multivessel CABG plan 4 AM January 17  Objective: Vital signs in last 24 hours: Temp:  [97.6 F (36.4 C)-98.4 F (36.9 C)] 97.6 F (36.4 C) (01/14 1407) Pulse Rate:  [54-72] 54 (01/14 1407) Cardiac Rhythm:  [-] Sinus bradycardia;Other (Comment) (01/14 1005) Resp:  [16-18] 18 (01/14 1407) BP: (124-137)/(50-72) 124/50 mmHg (01/14 1407) SpO2:  [97 %-100 %] 100 % (01/14 1407) Weight:  [163 lb 6.4 oz (74.118 kg)] 163 lb 6.4 oz (74.118 kg) (01/14 0900)  Hemodynamic parameters for last 24 hours:   stable  Intake/Output from previous day: 01/13 0701 - 01/14 0700 In: 2766.9 [P.O.:1080; I.V.:1686.9] Out: 1750 [Urine:1750] Intake/Output this shift: Total I/O In: 240 [P.O.:240] Out: -   Sinus rhythm No hematoma at cath site Lungs clear  Lab Results:  Recent Labs  09/28/15 0325 09/29/15 0406  WBC 7.1 8.2  HGB 10.4* 10.6*  HCT 31.6* 32.6*  PLT 164 178   BMET:  Recent Labs  09/27/15 0334  NA 144  K 3.7  CL 115*  CO2 26  GLUCOSE 140*  BUN 9  CREATININE 1.11  CALCIUM 8.0*    PT/INR: No results for input(s): LABPROT, INR in the last 72 hours. ABG    Component Value Date/Time   PHART 7.431 03/01/2008 1713   HCO3 22.7 03/01/2008 1713   TCO2 20.6 03/01/2008 1713   ACIDBASEDEF 0.7 03/01/2008 1713   O2SAT 47.6 03/01/2008 1713   CBG (last 3)   Recent Labs  09/29/15 0631 09/29/15 1114 09/29/15 1557  GLUCAP 140* 142* 162*    Assessment/Plan: S/P Procedure(s) (LRB): Left Heart Cath and Coronary Angiography (N/A) CABG plan Tuesday, procedure again reviewed with patient and family   LOS: 5 days    Tharon Aquas Trigt III 09/29/2015

## 2015-09-29 NOTE — Progress Notes (Signed)
ANTICOAGULATION CONSULT NOTE - Follow Up Consult  Pharmacy Consult for Heparin  Indication: 3v disease s/p cath   Allergies  Allergen Reactions  . Actos [Pioglitazone]     Itching, rash   . Levaquin [Levofloxacin] Itching and Rash  . Lisinopril Itching and Rash    Patient Measurements: Height: 5\' 6"  (167.6 cm) Weight: 163 lb 6.4 oz (74.118 kg) IBW/kg (Calculated) : 63.8  Vital Signs: Temp: 98.4 F (36.9 C) (01/14 0431) Temp Source: Oral (01/14 0431) BP: 137/72 mmHg (01/14 0431) Pulse Rate: 72 (01/14 0431)  Labs:  Recent Labs  09/27/15 0334  09/28/15 0325 09/28/15 1116 09/29/15 0406  HGB 10.6*  --  10.4*  --  10.6*  HCT 33.0*  --  31.6*  --  32.6*  PLT 172  --  164  --  178  HEPARINUNFRC  --   < > 0.34 0.38 0.37  CREATININE 1.11  --   --   --   --   < > = values in this interval not displayed.  Estimated Creatinine Clearance: 55.1 mL/min (by C-G formula based on Cr of 1.11).   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 Plans for CABG on 1/17. Heparin level therapeutic (0.37) on 1100 units/hr.   Goal of Therapy:  Heparin level 0.3-0.7 units/ml Monitor platelets by anticoagulation protocol: Yes   Plan:  -Continue heparin at 1100 units/hr -Daily heparin level and CBC  Hildred Laser, Pharm D 09/29/2015 10:39 AM

## 2015-09-29 NOTE — Progress Notes (Signed)
Pt heart rate in the 50's. I held Toprol dose this morning and have been assessing HR through the day. It rarely gets over 75. Pt is asymptomatic with no chest pain. He is staying quiet in the room.Family present. FYI test sent to NP covering.

## 2015-09-30 LAB — GLUCOSE, CAPILLARY
GLUCOSE-CAPILLARY: 157 mg/dL — AB (ref 65–99)
GLUCOSE-CAPILLARY: 108 mg/dL — AB (ref 65–99)
GLUCOSE-CAPILLARY: 246 mg/dL — AB (ref 65–99)
Glucose-Capillary: 174 mg/dL — ABNORMAL HIGH (ref 65–99)

## 2015-09-30 LAB — CBC
HEMATOCRIT: 32.5 % — AB (ref 39.0–52.0)
HEMOGLOBIN: 10.8 g/dL — AB (ref 13.0–17.0)
MCH: 27.9 pg (ref 26.0–34.0)
MCHC: 33.2 g/dL (ref 30.0–36.0)
MCV: 84 fL (ref 78.0–100.0)
Platelets: 182 10*3/uL (ref 150–400)
RBC: 3.87 MIL/uL — AB (ref 4.22–5.81)
RDW: 18.6 % — ABNORMAL HIGH (ref 11.5–15.5)
WBC: 7.6 10*3/uL (ref 4.0–10.5)

## 2015-09-30 LAB — HEPARIN LEVEL (UNFRACTIONATED): Heparin Unfractionated: 0.38 IU/mL (ref 0.30–0.70)

## 2015-09-30 NOTE — Progress Notes (Signed)
Subjective:  No CP/SOB. On IV hep. 3 VD. For CABG Tues  Objective:  Temp:  [97.6 F (36.4 C)-98.1 F (36.7 C)] 97.9 F (36.6 C) (01/15 0651) Pulse Rate:  [54-72] 61 (01/15 0651) Resp:  [18] 18 (01/15 0651) BP: (124-154)/(50-76) 154/54 mmHg (01/15 0651) SpO2:  [97 %-100 %] 97 % (01/15 0651) Weight:  [164 lb 4.8 oz (74.526 kg)] 164 lb 4.8 oz (74.526 kg) (01/15 0651) Weight change:   Intake/Output from previous day: 01/14 0701 - 01/15 0700 In: 240 [P.O.:240] Out: -   Intake/Output from this shift: Total I/O In: 1304.2 [P.O.:240; I.V.:1064.2] Out: -   Physical Exam: General appearance: alert and no distress Neck: no adenopathy, no carotid bruit, no JVD, supple, symmetrical, trachea midline and thyroid not enlarged, symmetric, no tenderness/mass/nodules Lungs: clear to auscultation bilaterally Heart: regular rate and rhythm, S1, S2 normal, no murmur, click, rub or gallop Extremities: extremities normal, atraumatic, no cyanosis or edema  Lab Results: Results for orders placed or performed during the hospital encounter of 09/24/15 (from the past 48 hour(s))  Glucose, capillary     Status: Abnormal   Collection Time: 09/28/15  4:29 PM  Result Value Ref Range   Glucose-Capillary 164 (H) 65 - 99 mg/dL   Comment 1 Notify RN    Comment 2 Document in Chart   Glucose, capillary     Status: Abnormal   Collection Time: 09/28/15  9:43 PM  Result Value Ref Range   Glucose-Capillary 156 (H) 65 - 99 mg/dL  CBC     Status: Abnormal   Collection Time: 09/29/15  4:06 AM  Result Value Ref Range   WBC 8.2 4.0 - 10.5 K/uL   RBC 3.90 (L) 4.22 - 5.81 MIL/uL   Hemoglobin 10.6 (L) 13.0 - 17.0 g/dL   HCT 32.6 (L) 39.0 - 52.0 %   MCV 83.6 78.0 - 100.0 fL   MCH 27.2 26.0 - 34.0 pg   MCHC 32.5 30.0 - 36.0 g/dL   RDW 18.3 (H) 11.5 - 15.5 %   Platelets 178 150 - 400 K/uL  Heparin level (unfractionated)     Status: None   Collection Time: 09/29/15  4:06 AM  Result Value Ref Range   Heparin Unfractionated 0.37 0.30 - 0.70 IU/mL    Comment:        IF HEPARIN RESULTS ARE BELOW EXPECTED VALUES, AND PATIENT DOSAGE HAS BEEN CONFIRMED, SUGGEST FOLLOW UP TESTING OF ANTITHROMBIN III LEVELS.   Glucose, capillary     Status: Abnormal   Collection Time: 09/29/15  6:31 AM  Result Value Ref Range   Glucose-Capillary 140 (H) 65 - 99 mg/dL  Glucose, capillary     Status: Abnormal   Collection Time: 09/29/15 11:14 AM  Result Value Ref Range   Glucose-Capillary 142 (H) 65 - 99 mg/dL   Comment 1 Notify RN    Comment 2 Document in Chart   Glucose, capillary     Status: Abnormal   Collection Time: 09/29/15  3:57 PM  Result Value Ref Range   Glucose-Capillary 162 (H) 65 - 99 mg/dL   Comment 1 Notify RN    Comment 2 Document in Chart   Glucose, capillary     Status: Abnormal   Collection Time: 09/29/15  9:08 PM  Result Value Ref Range   Glucose-Capillary 222 (H) 65 - 99 mg/dL   Comment 1 Notify RN   Heparin level (unfractionated)     Status: None   Collection Time: 09/30/15  3:43 AM  Result Value Ref Range   Heparin Unfractionated 0.38 0.30 - 0.70 IU/mL    Comment:        IF HEPARIN RESULTS ARE BELOW EXPECTED VALUES, AND PATIENT DOSAGE HAS BEEN CONFIRMED, SUGGEST FOLLOW UP TESTING OF ANTITHROMBIN III LEVELS.   CBC     Status: Abnormal   Collection Time: 09/30/15  3:45 AM  Result Value Ref Range   WBC 7.6 4.0 - 10.5 K/uL   RBC 3.87 (L) 4.22 - 5.81 MIL/uL   Hemoglobin 10.8 (L) 13.0 - 17.0 g/dL   HCT 32.5 (L) 39.0 - 52.0 %   MCV 84.0 78.0 - 100.0 fL   MCH 27.9 26.0 - 34.0 pg   MCHC 33.2 30.0 - 36.0 g/dL   RDW 18.6 (H) 11.5 - 15.5 %   Platelets 182 150 - 400 K/uL  Glucose, capillary     Status: Abnormal   Collection Time: 09/30/15  6:49 AM  Result Value Ref Range   Glucose-Capillary 157 (H) 65 - 99 mg/dL   Comment 1 Notify RN   Glucose, capillary     Status: Abnormal   Collection Time: 09/30/15 11:13 AM  Result Value Ref Range   Glucose-Capillary 174 (H) 65 -  99 mg/dL   Comment 1 Notify RN    Comment 2 Document in Chart     Imaging: Imaging results have been reviewed  Tele- NSR  Assessment/Plan:   1. Principal Problem: 2.   Non-STEMI (non-ST elevated myocardial infarction) (Fernando Hess) 3. Active Problems: 4.   Diabetes mellitus type 2, noninsulin dependent (Fernando Hess) 5.   CAD (coronary artery disease), native coronary artery 6.   Acute renal failure (ARF) (Fernando Hess) 7.   Elevated troponin 8.   Time Spent Directly with Patient:  10 minutes  Length of Stay:  LOS: 6 days   NSTEMI. Cath revealed 3VD with mod LV dysfunction. On iV hep. No CP. Labs OK. For CABG Tues. Dr. [name] to resume care tomorrow.   [name] 09/30/2015, 11:45 AM

## 2015-09-30 NOTE — Progress Notes (Signed)
ANTICOAGULATION CONSULT NOTE - Follow Up Consult  Pharmacy Consult for Heparin  Indication: 3v disease s/p cath   Allergies  Allergen Reactions  . Actos [Pioglitazone]     Itching, rash   . Levaquin [Levofloxacin] Itching and Rash  . Lisinopril Itching and Rash    Patient Measurements: Height: 5\' 6"  (167.6 cm) Weight: 164 lb 4.8 oz (74.526 kg) IBW/kg (Calculated) : 63.8  Vital Signs: Temp: 97.9 F (36.6 C) (01/15 0651) Temp Source: Oral (01/15 0651) BP: 154/54 mmHg (01/15 0651) Pulse Rate: 61 (01/15 0651)  Labs:  Recent Labs  09/28/15 0325 09/28/15 1116 09/29/15 0406 09/30/15 0343 09/30/15 0345  HGB 10.4*  --  10.6*  --  10.8*  HCT 31.6*  --  32.6*  --  32.5*  PLT 164  --  178  --  182  HEPARINUNFRC 0.34 0.38 0.37 0.38  --     Estimated Creatinine Clearance: 55.1 mL/min (by C-G formula based on Cr of 1.11).   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 Plans for CABG on 1/17. Heparin level therapeutic (0.38) on 1100 units/hr.   Goal of Therapy:  Heparin level 0.3-0.7 units/ml Monitor platelets by anticoagulation protocol: Yes   Plan:  -Continue heparin at 1100 units/hr -Daily heparin level and CBC  Hildred Laser, Pharm D 09/30/2015 11:05 AM

## 2015-10-01 ENCOUNTER — Inpatient Hospital Stay (HOSPITAL_COMMUNITY): Payer: Medicare Other

## 2015-10-01 DIAGNOSIS — I25110 Atherosclerotic heart disease of native coronary artery with unstable angina pectoris: Secondary | ICD-10-CM

## 2015-10-01 LAB — PULMONARY FUNCTION TEST
DL/VA % pred: 93 %
DL/VA: 4.05 ml/min/mmHg/L
DLCO unc % pred: 38 %
DLCO unc: 10.34 ml/min/mmHg
FEF 25-75 Post: 0.39 L/sec
FEF 25-75 Pre: 1.79 L/sec
FEF2575-%Change-Post: -78 %
FEF2575-%Pred-Post: 19 %
FEF2575-%Pred-Pre: 87 %
FEV1-%Change-Post: -67 %
FEV1-%Pred-Post: 22 %
FEV1-%Pred-Pre: 68 %
FEV1-Post: 0.61 L
FEV1-Pre: 1.85 L
FEV1FVC-%Change-Post: -70 %
FEV1FVC-%Pred-Pre: 97 %
FEV6-%Change-Post: 3 %
FEV6-%Pred-Post: 70 %
FEV6-%Pred-Pre: 68 %
FEV6-Post: 2.47 L
FEV6-Pre: 2.37 L
FEV6FVC-%Change-Post: -14 %
FEV6FVC-%Pred-Post: 90 %
FEV6FVC-%Pred-Pre: 106 %
FVC-%Change-Post: 10 %
FVC-%Pred-Post: 77 %
FVC-%Pred-Pre: 70 %
FVC-Post: 2.89 L
FVC-Pre: 2.61 L
Post FEV1/FVC ratio: 21 %
Post FEV6/FVC ratio: 85 %
Pre FEV1/FVC ratio: 71 %
Pre FEV6/FVC Ratio: 100 %
RV % pred: 102 %
RV: 2.32 L
TLC % pred: 73 %
TLC: 4.54 L

## 2015-10-01 LAB — CBC
HCT: 31.3 % — ABNORMAL LOW (ref 39.0–52.0)
HEMOGLOBIN: 10.1 g/dL — AB (ref 13.0–17.0)
MCH: 26.8 pg (ref 26.0–34.0)
MCHC: 32.3 g/dL (ref 30.0–36.0)
MCV: 83 fL (ref 78.0–100.0)
PLATELETS: 188 10*3/uL (ref 150–400)
RBC: 3.77 MIL/uL — AB (ref 4.22–5.81)
RDW: 18.7 % — ABNORMAL HIGH (ref 11.5–15.5)
WBC: 7.3 10*3/uL (ref 4.0–10.5)

## 2015-10-01 LAB — HEPARIN LEVEL (UNFRACTIONATED): HEPARIN UNFRACTIONATED: 0.35 [IU]/mL (ref 0.30–0.70)

## 2015-10-01 LAB — GLUCOSE, CAPILLARY
GLUCOSE-CAPILLARY: 184 mg/dL — AB (ref 65–99)
GLUCOSE-CAPILLARY: 314 mg/dL — AB (ref 65–99)
Glucose-Capillary: 136 mg/dL — ABNORMAL HIGH (ref 65–99)
Glucose-Capillary: 153 mg/dL — ABNORMAL HIGH (ref 65–99)

## 2015-10-01 LAB — ABO/RH: ABO/RH(D): O POS

## 2015-10-01 LAB — PREPARE RBC (CROSSMATCH)

## 2015-10-01 LAB — MRSA PCR SCREENING: MRSA by PCR: NEGATIVE

## 2015-10-01 MED ORDER — EPINEPHRINE HCL 1 MG/ML IJ SOLN
0.0000 ug/min | INTRAVENOUS | Status: DC
  Filled 2015-10-01 (×2): qty 4

## 2015-10-01 MED ORDER — VANCOMYCIN HCL 10 G IV SOLR
1250.0000 mg | INTRAVENOUS | Status: DC
  Filled 2015-10-01 (×2): qty 1250

## 2015-10-01 MED ORDER — ALBUTEROL SULFATE (2.5 MG/3ML) 0.083% IN NEBU
2.5000 mg | INHALATION_SOLUTION | Freq: Once | RESPIRATORY_TRACT | Status: AC
  Administered 2015-10-01: 2.5 mg via RESPIRATORY_TRACT

## 2015-10-01 MED ORDER — DEXTROSE 5 % IV SOLN
1.5000 g | INTRAVENOUS | Status: DC
  Filled 2015-10-01 (×2): qty 1.5

## 2015-10-01 MED ORDER — ALPRAZOLAM 0.25 MG PO TABS: 0.2500 mg | ORAL_TABLET | ORAL | Status: DC | PRN

## 2015-10-01 MED ORDER — SODIUM CHLORIDE 0.9 % IV SOLN
INTRAVENOUS | Status: DC
  Filled 2015-10-01: qty 30

## 2015-10-01 MED ORDER — PHENYLEPHRINE HCL 10 MG/ML IJ SOLN
30.0000 ug/min | INTRAVENOUS | Status: DC
  Administered 2015-10-02: 10 ug/min via INTRAVENOUS
  Filled 2015-10-01: qty 2

## 2015-10-01 MED ORDER — SODIUM CHLORIDE 0.9 % IV SOLN
INTRAVENOUS | Status: DC
Start: 2015-10-02 — End: 2015-10-02
  Administered 2015-10-02: 2.6 [IU]/h via INTRAVENOUS
  Filled 2015-10-01: qty 2.5

## 2015-10-01 MED ORDER — CHLORHEXIDINE GLUCONATE 0.12 % MT SOLN
15.0000 mL | Freq: Once | OROMUCOSAL | Status: AC
  Administered 2015-10-02: 15 mL via OROMUCOSAL

## 2015-10-01 MED ORDER — SORBITOL 70 % SOLN
30.0000 mL | Freq: Once | Status: DC
  Filled 2015-10-01: qty 30

## 2015-10-01 MED ORDER — CHLORHEXIDINE GLUCONATE 4 % EX LIQD
60.0000 mL | Freq: Once | CUTANEOUS | Status: AC
  Administered 2015-10-02: 4 via TOPICAL

## 2015-10-01 MED ORDER — DOPAMINE-DEXTROSE 3.2-5 MG/ML-% IV SOLN
0.0000 ug/kg/min | INTRAVENOUS | Status: DC
  Filled 2015-10-01: qty 250

## 2015-10-01 MED ORDER — CHLORHEXIDINE GLUCONATE 4 % EX LIQD
60.0000 mL | Freq: Once | CUTANEOUS | Status: AC
  Administered 2015-10-01: 4 via TOPICAL
  Filled 2015-10-01: qty 60

## 2015-10-01 MED ORDER — TEMAZEPAM 15 MG PO CAPS: 15.0000 mg | ORAL_CAPSULE | Freq: Once | ORAL | Status: DC | PRN

## 2015-10-01 MED ORDER — PLASMA-LYTE 148 IV SOLN
INTRAVENOUS | Status: DC
  Filled 2015-10-01: qty 2.5

## 2015-10-01 MED ORDER — MAGNESIUM SULFATE 50 % IJ SOLN
40.0000 meq | INTRAMUSCULAR | Status: DC
  Filled 2015-10-01: qty 10

## 2015-10-01 MED ORDER — POTASSIUM CHLORIDE 2 MEQ/ML IV SOLN
80.0000 meq | INTRAVENOUS | Status: DC
  Filled 2015-10-01: qty 40

## 2015-10-01 MED ORDER — METOPROLOL TARTRATE 12.5 MG HALF TABLET
12.5000 mg | ORAL_TABLET | Freq: Once | ORAL | Status: AC
  Administered 2015-10-02: 12.5 mg via ORAL
  Filled 2015-10-01: qty 1

## 2015-10-01 MED ORDER — NITROGLYCERIN IN D5W 200-5 MCG/ML-% IV SOLN
2.0000 ug/min | INTRAVENOUS | Status: DC
  Administered 2015-10-02: 16.67 ug/min via INTRAVENOUS
  Filled 2015-10-01: qty 250

## 2015-10-01 MED ORDER — BISACODYL 5 MG PO TBEC
5.0000 mg | DELAYED_RELEASE_TABLET | Freq: Once | ORAL | Status: AC
  Administered 2015-10-01: 5 mg via ORAL
  Filled 2015-10-01: qty 1

## 2015-10-01 MED ORDER — DEXMEDETOMIDINE HCL IN NACL 400 MCG/100ML IV SOLN
0.1000 ug/kg/h | INTRAVENOUS | Status: DC
  Administered 2015-10-02: .3 ug/kg/h via INTRAVENOUS
  Filled 2015-10-01: qty 100

## 2015-10-01 MED ORDER — DIAZEPAM 5 MG PO TABS
5.0000 mg | ORAL_TABLET | Freq: Once | ORAL | Status: AC
  Administered 2015-10-02: 5 mg via ORAL
  Filled 2015-10-01: qty 1

## 2015-10-01 MED ORDER — SODIUM CHLORIDE 0.9 % IV SOLN
INTRAVENOUS | Status: DC
  Administered 2015-10-02: 70 mL/h via INTRAVENOUS
  Filled 2015-10-01: qty 40

## 2015-10-01 MED ORDER — DEXTROSE 5 % IV SOLN
750.0000 mg | INTRAVENOUS | Status: DC
  Filled 2015-10-01: qty 750

## 2015-10-01 NOTE — Progress Notes (Signed)
ANTICOAGULATION CONSULT NOTE - Follow Up Consult  Pharmacy Consult for Heparin  Indication: 3v disease s/p cath   Allergies  Allergen Reactions  . Actos [Pioglitazone]     Itching, rash   . Levaquin [Levofloxacin] Itching and Rash  . Lisinopril Itching and Rash    Patient Measurements: Height: 5\' 6"  (167.6 cm) Weight: 163 lb 12.8 oz (74.3 kg) IBW/kg (Calculated) : 63.8  Vital Signs: Temp: 99.4 F (37.4 C) (01/16 0410) Temp Source: Oral (01/16 0410) BP: 139/65 mmHg (01/16 0410) Pulse Rate: 100 (01/16 0410)  Labs:  Recent Labs  09/29/15 0406 09/30/15 0343 09/30/15 0345 10/01/15 0528  HGB 10.6*  --  10.8* 10.1*  HCT 32.6*  --  32.5* 31.3*  PLT 178  --  182 188  HEPARINUNFRC 0.37 0.38  --  0.35    Estimated Creatinine Clearance: 55.1 mL/min (by C-G formula based on Cr of 1.11).   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 Plans for CABG on 1/17. Heparin level therapeutic (0.35) on 1100 units/hr. CBC stable, no bleeding reported.   Goal of Therapy:  Heparin level 0.3-0.7 units/ml Monitor platelets by anticoagulation protocol: Yes   Plan:  -Continue heparin at 1100 units/hr -Daily heparin level and CBC - for CABG 11/17 am  Eudelia Bunch, Pharm.D. BP:[phone] 10/01/2015 11:31 AM

## 2015-10-01 NOTE — Progress Notes (Signed)
5 Days Post-Op Procedure(s) (LRB): Left Heart Cath and Coronary Angiography (N/A) Subjective: Patient ready for CABG PFTs performed with adequate mechanics but low DLCO Will repeat pre - op CXR RA sats 96% Has been on iv heparin Objective: Vital signs in last 24 hours: Temp:  [98 F (36.7 C)-99.4 F (37.4 C)] 98.7 F (37.1 C) (01/16 1300) Pulse Rate:  [50-100] 62 (01/16 1300) Cardiac Rhythm:  [-] Sinus bradycardia (01/16 0717) Resp:  [18] 18 (01/16 1300) BP: (115-139)/(56-68) 120/68 mmHg (01/16 1300) SpO2:  [96 %-98 %] 96 % (01/16 1300) Weight:  [163 lb 12.8 oz (74.3 kg)] 163 lb 12.8 oz (74.3 kg) (01/16 0410)  Hemodynamic parameters for last 24 hours:  stable  Intake/Output from previous day: 01/15 0701 - 01/16 0700 In: 1900.7 [P.O.:600; I.V.:1300.7] Out: -  Intake/Output this shift: Total I/O In: 480 [P.O.:480] Out: -     Lab Results:  Recent Labs  09/30/15 0345 10/01/15 0528  WBC 7.6 7.3  HGB 10.8* 10.1*  HCT 32.5* 31.3*  PLT 182 188   BMET: No results for input(s): NA, K, CL, CO2, GLUCOSE, BUN, CREATININE, CALCIUM in the last 72 hours.  PT/INR: No results for input(s): LABPROT, INR in the last 72 hours. ABG    Component Value Date/Time   PHART 7.431 03/01/2008 1713   HCO3 22.7 03/01/2008 1713   TCO2 20.6 03/01/2008 1713   ACIDBASEDEF 0.7 03/01/2008 1713   O2SAT 47.6 03/01/2008 1713   CBG (last 3)   Recent Labs  09/30/15 2107 10/01/15 0700 10/01/15 1148  GLUCAP 246* 184* 136*    Assessment/Plan: S/P Procedure(s) (LRB): Left Heart Cath and Coronary Angiography (N/A) CABG discussed with patient and family- benefits and risks OR in AM   LOS: 7 days    Fernando Hess 10/01/2015

## 2015-10-01 NOTE — Care Management Important Message (Signed)
Important Message  Patient Details  Name: Fernando Hess MRN: [id number] Date of Birth: 1945-11-11   Medicare Important Message Given:  Yes    Loann Quill 10/01/2015, 12:36 PM

## 2015-10-02 ENCOUNTER — Inpatient Hospital Stay (HOSPITAL_COMMUNITY): Payer: Medicare Other | Admitting: Anesthesiology

## 2015-10-02 ENCOUNTER — Encounter (HOSPITAL_COMMUNITY): Payer: Self-pay | Admitting: Anesthesiology

## 2015-10-02 ENCOUNTER — Encounter (HOSPITAL_COMMUNITY)
Admission: EM | Disposition: A | Discharge status: Home / Self Care | Payer: Medicare Other | Source: Home / Self Care | Attending: Cardiology

## 2015-10-02 ENCOUNTER — Inpatient Hospital Stay (HOSPITAL_COMMUNITY): Payer: Medicare Other

## 2015-10-02 DIAGNOSIS — Z951 Presence of aortocoronary bypass graft: Secondary | ICD-10-CM

## 2015-10-02 HISTORY — PX: TEE WITHOUT CARDIOVERSION: SHX5443

## 2015-10-02 HISTORY — PX: CORONARY ARTERY BYPASS GRAFT: SHX141

## 2015-10-02 LAB — PROTIME-INR
INR: 1.5 — ABNORMAL HIGH (ref 0.00–1.49)
Prothrombin Time: 18.2 seconds — ABNORMAL HIGH (ref 11.6–15.2)

## 2015-10-02 LAB — CBC
HCT: 34.9 % — ABNORMAL LOW (ref 39.0–52.0)
HCT: 28.8 % — ABNORMAL LOW (ref 39.0–52.0)
HEMATOCRIT: 28.2 % — AB (ref 39.0–52.0)
Hemoglobin: 11.3 g/dL — ABNORMAL LOW (ref 13.0–17.0)
Hemoglobin: 9.5 g/dL — ABNORMAL LOW (ref 13.0–17.0)
Hemoglobin: 9.6 g/dL — ABNORMAL LOW (ref 13.0–17.0)
MCH: 27.4 pg (ref 26.0–34.0)
MCH: 28 pg (ref 26.0–34.0)
MCH: 27.4 pg (ref 26.0–34.0)
MCHC: 32.4 g/dL (ref 30.0–36.0)
MCHC: 33.7 g/dL (ref 30.0–36.0)
MCHC: 33.3 g/dL (ref 30.0–36.0)
MCV: 84.7 fL (ref 78.0–100.0)
MCV: 83.2 fL (ref 78.0–100.0)
MCV: 82.3 fL (ref 78.0–100.0)
PLATELETS: 219 10*3/uL (ref 150–400)
Platelets: 118 10*3/uL — ABNORMAL LOW (ref 150–400)
Platelets: 124 10*3/uL — ABNORMAL LOW (ref 150–400)
RBC: 4.12 MIL/uL — AB (ref 4.22–5.81)
RBC: 3.39 MIL/uL — ABNORMAL LOW (ref 4.22–5.81)
RBC: 3.5 MIL/uL — ABNORMAL LOW (ref 4.22–5.81)
RDW: 19 % — AB (ref 11.5–15.5)
RDW: 18 % — AB (ref 11.5–15.5)
RDW: 18 % — ABNORMAL HIGH (ref 11.5–15.5)
WBC: 8.6 10*3/uL (ref 4.0–10.5)
WBC: 7.8 10*3/uL (ref 4.0–10.5)
WBC: 8.8 10*3/uL (ref 4.0–10.5)

## 2015-10-02 LAB — POCT I-STAT, CHEM 8
BUN: 17 mg/dL (ref 6–20)
BUN: 12 mg/dL (ref 6–20)
BUN: 14 mg/dL (ref 6–20)
BUN: 13 mg/dL (ref 6–20)
BUN: 13 mg/dL (ref 6–20)
BUN: 12 mg/dL (ref 6–20)
CALCIUM ION: 1.24 mmol/L (ref 1.13–1.30)
CHLORIDE: 100 mmol/L — AB (ref 101–111)
CHLORIDE: 102 mmol/L (ref 101–111)
CHLORIDE: 102 mmol/L (ref 101–111)
CREATININE: 0.8 mg/dL (ref 0.61–1.24)
CREATININE: 0.8 mg/dL (ref 0.61–1.24)
Calcium, Ion: 1.28 mmol/L (ref 1.13–1.30)
Calcium, Ion: 0.9 mmol/L — ABNORMAL LOW (ref 1.13–1.30)
Calcium, Ion: 1.21 mmol/L (ref 1.13–1.30)
Calcium, Ion: 1.06 mmol/L — ABNORMAL LOW (ref 1.13–1.30)
Calcium, Ion: 0.92 mmol/L — ABNORMAL LOW (ref 1.13–1.30)
Chloride: 105 mmol/L (ref 101–111)
Chloride: 107 mmol/L (ref 101–111)
Chloride: 108 mmol/L (ref 101–111)
Creatinine, Ser: 0.4 mg/dL — ABNORMAL LOW (ref 0.61–1.24)
Creatinine, Ser: 0.7 mg/dL (ref 0.61–1.24)
Creatinine, Ser: 0.8 mg/dL (ref 0.61–1.24)
Creatinine, Ser: 0.9 mg/dL (ref 0.61–1.24)
GLUCOSE: 154 mg/dL — AB (ref 65–99)
GLUCOSE: 115 mg/dL — AB (ref 65–99)
Glucose, Bld: 192 mg/dL — ABNORMAL HIGH (ref 65–99)
Glucose, Bld: 186 mg/dL — ABNORMAL HIGH (ref 65–99)
Glucose, Bld: 144 mg/dL — ABNORMAL HIGH (ref 65–99)
Glucose, Bld: 153 mg/dL — ABNORMAL HIGH (ref 65–99)
HCT: 25 % — ABNORMAL LOW (ref 39.0–52.0)
HCT: 28 % — ABNORMAL LOW (ref 39.0–52.0)
HEMATOCRIT: 30 % — AB (ref 39.0–52.0)
HEMATOCRIT: 20 % — AB (ref 39.0–52.0)
HEMATOCRIT: 26 % — AB (ref 39.0–52.0)
HEMATOCRIT: 26 % — AB (ref 39.0–52.0)
HEMOGLOBIN: 10.2 g/dL — AB (ref 13.0–17.0)
HEMOGLOBIN: 6.8 g/dL — AB (ref 13.0–17.0)
HEMOGLOBIN: 8.8 g/dL — AB (ref 13.0–17.0)
Hemoglobin: 8.5 g/dL — ABNORMAL LOW (ref 13.0–17.0)
Hemoglobin: 8.8 g/dL — ABNORMAL LOW (ref 13.0–17.0)
Hemoglobin: 9.5 g/dL — ABNORMAL LOW (ref 13.0–17.0)
POTASSIUM: 4.3 mmol/L (ref 3.5–5.1)
POTASSIUM: 4.2 mmol/L (ref 3.5–5.1)
POTASSIUM: 4.2 mmol/L (ref 3.5–5.1)
Potassium: 4.6 mmol/L (ref 3.5–5.1)
Potassium: 4.2 mmol/L (ref 3.5–5.1)
Potassium: 4 mmol/L (ref 3.5–5.1)
SODIUM: 140 mmol/L (ref 135–145)
SODIUM: 138 mmol/L (ref 135–145)
SODIUM: 141 mmol/L (ref 135–145)
SODIUM: 139 mmol/L (ref 135–145)
Sodium: 140 mmol/L (ref 135–145)
Sodium: 139 mmol/L (ref 135–145)
TCO2: 28 mmol/L (ref 0–100)
TCO2: 26 mmol/L (ref 0–100)
TCO2: 29 mmol/L (ref 0–100)
TCO2: 27 mmol/L (ref 0–100)
TCO2: 23 mmol/L (ref 0–100)
TCO2: 21 mmol/L (ref 0–100)

## 2015-10-02 LAB — POCT I-STAT 3, ART BLOOD GAS (G3+)
ACID-BASE DEFICIT: 1 mmol/L (ref 0.0–2.0)
ACID-BASE DEFICIT: 2 mmol/L (ref 0.0–2.0)
ACID-BASE EXCESS: 6 mmol/L — AB (ref 0.0–2.0)
Acid-Base Excess: 4 mmol/L — ABNORMAL HIGH (ref 0.0–2.0)
Acid-Base Excess: 1 mmol/L (ref 0.0–2.0)
BICARBONATE: 28.1 meq/L — AB (ref 20.0–24.0)
BICARBONATE: 28.2 meq/L — AB (ref 20.0–24.0)
Bicarbonate: 24.6 mEq/L — ABNORMAL HIGH (ref 20.0–24.0)
Bicarbonate: 23.2 mEq/L (ref 20.0–24.0)
Bicarbonate: 21.2 mEq/L (ref 20.0–24.0)
O2 SAT: 100 %
O2 SAT: 100 %
O2 SAT: 93 %
O2 SAT: 100 %
O2 Saturation: 100 %
PCO2 ART: 33.4 mmHg — AB (ref 35.0–45.0)
PH ART: 7.437 (ref 7.350–7.450)
PH ART: 7.428 (ref 7.350–7.450)
PH ART: 7.455 — AB (ref 7.350–7.450)
PO2 ART: 503 mmHg — AB (ref 80.0–100.0)
PO2 ART: 465 mmHg — AB (ref 80.0–100.0)
PO2 ART: 220 mmHg — AB (ref 80.0–100.0)
PO2 ART: 61 mmHg — AB (ref 80.0–100.0)
PO2 ART: 170 mmHg — AB (ref 80.0–100.0)
Patient temperature: 35.9
Patient temperature: 36.4
TCO2: 29 mmol/L (ref 0–100)
TCO2: 29 mmol/L (ref 0–100)
TCO2: 26 mmol/L (ref 0–100)
TCO2: 24 mmol/L (ref 0–100)
TCO2: 22 mmol/L (ref 0–100)
pCO2 arterial: 41.7 mmHg (ref 35.0–45.0)
pCO2 arterial: 29.5 mmHg — ABNORMAL LOW (ref 35.0–45.0)
pCO2 arterial: 34.7 mmHg — ABNORMAL LOW (ref 35.0–45.0)
pCO2 arterial: 30 mmHg — ABNORMAL LOW (ref 35.0–45.0)
pH, Arterial: 7.587 — ABNORMAL HIGH (ref 7.350–7.450)
pH, Arterial: 7.476 — ABNORMAL HIGH (ref 7.350–7.450)

## 2015-10-02 LAB — BASIC METABOLIC PANEL
Anion gap: 10 (ref 5–15)
BUN: 19 mg/dL (ref 6–20)
CO2: 26 mmol/L (ref 22–32)
Calcium: 9.4 mg/dL (ref 8.9–10.3)
Chloride: 104 mmol/L (ref 101–111)
Creatinine, Ser: 1.32 mg/dL — ABNORMAL HIGH (ref 0.61–1.24)
GFR calc Af Amer: >60 mL/min (ref >60)
GFR calc non Af Amer: 53 mL/min — ABNORMAL LOW (ref >60)
Glucose, Bld: 224 mg/dL — ABNORMAL HIGH (ref 65–99)
Potassium: 4.7 mmol/L (ref 3.5–5.1)
Sodium: 140 mmol/L (ref 135–145)

## 2015-10-02 LAB — GLUCOSE, CAPILLARY
GLUCOSE-CAPILLARY: 100 mg/dL — AB (ref 65–99)
GLUCOSE-CAPILLARY: 112 mg/dL — AB (ref 65–99)
Glucose-Capillary: 116 mg/dL — ABNORMAL HIGH (ref 65–99)
Glucose-Capillary: 85 mg/dL (ref 65–99)
Glucose-Capillary: 102 mg/dL — ABNORMAL HIGH (ref 65–99)

## 2015-10-02 LAB — POCT I-STAT 4, (NA,K, GLUC, HGB,HCT)
GLUCOSE: 136 mg/dL — AB (ref 65–99)
HCT: 29 % — ABNORMAL LOW (ref 39.0–52.0)
HEMOGLOBIN: 9.9 g/dL — AB (ref 13.0–17.0)
POTASSIUM: 3.6 mmol/L (ref 3.5–5.1)
Sodium: 141 mmol/L (ref 135–145)

## 2015-10-02 LAB — MAGNESIUM: Magnesium: 2.8 mg/dL — ABNORMAL HIGH (ref 1.7–2.4)

## 2015-10-02 LAB — APTT: APTT: 31 s (ref 24–37)

## 2015-10-02 LAB — CREATININE, SERUM
Creatinine, Ser: 1.09 mg/dL (ref 0.61–1.24)
GFR calc Af Amer: >60 mL/min (ref >60)
GFR calc non Af Amer: >60 mL/min (ref >60)

## 2015-10-02 LAB — PLATELET COUNT: Platelets: 114 10*3/uL — ABNORMAL LOW (ref 150–400)

## 2015-10-02 LAB — HEPARIN LEVEL (UNFRACTIONATED): HEPARIN UNFRACTIONATED: 0.19 [IU]/mL — AB (ref 0.30–0.70)

## 2015-10-02 LAB — HEMOGLOBIN AND HEMATOCRIT, BLOOD
HCT: 24.2 % — ABNORMAL LOW (ref 39.0–52.0)
Hemoglobin: 8 g/dL — ABNORMAL LOW (ref 13.0–17.0)

## 2015-10-02 LAB — PREPARE RBC (CROSSMATCH)

## 2015-10-02 SURGERY — CORONARY ARTERY BYPASS GRAFTING (CABG)
Anesthesia: General | Site: Chest

## 2015-10-02 MED ORDER — ACETAMINOPHEN 500 MG PO TABS
1000.0000 mg | ORAL_TABLET | Freq: Four times a day (QID) | ORAL | Status: AC
  Administered 2015-10-03 – 2015-10-07 (×16): 1000 mg via ORAL
  Filled 2015-10-02 (×16): qty 2

## 2015-10-02 MED ORDER — MORPHINE SULFATE (PF) 2 MG/ML IV SOLN
1.0000 mg | INTRAVENOUS | Status: AC | PRN
  Administered 2015-10-02 (×2): 2 mg via INTRAVENOUS
  Filled 2015-10-02: qty 2
  Filled 2015-10-02: qty 1

## 2015-10-02 MED ORDER — PROPOFOL 10 MG/ML IV BOLUS
INTRAVENOUS | Status: DC | PRN
  Administered 2015-10-02: 30 mg via INTRAVENOUS

## 2015-10-02 MED ORDER — ONDANSETRON HCL 4 MG/2ML IJ SOLN: 4.0000 mg | Freq: Four times a day (QID) | INTRAMUSCULAR | Status: DC | PRN

## 2015-10-02 MED ORDER — ALBUMIN HUMAN 5 % IV SOLN
250.0000 mL | INTRAVENOUS | Status: AC | PRN
  Administered 2015-10-02 – 2015-10-03 (×3): 250 mL via INTRAVENOUS
  Filled 2015-10-02 (×2): qty 250

## 2015-10-02 MED ORDER — NITROGLYCERIN IN D5W 200-5 MCG/ML-% IV SOLN: 0.0000 ug/min | INTRAVENOUS | Status: DC

## 2015-10-02 MED ORDER — HEMOSTATIC AGENTS (NO CHARGE) OPTIME
TOPICAL | Status: DC | PRN
  Administered 2015-10-02 (×2): 1 via TOPICAL

## 2015-10-02 MED ORDER — EPHEDRINE SULFATE 50 MG/ML IJ SOLN
INTRAMUSCULAR | Status: AC
  Filled 2015-10-02: qty 1

## 2015-10-02 MED ORDER — INSULIN REGULAR BOLUS VIA INFUSION
0.0000 [IU] | Freq: Three times a day (TID) | INTRAVENOUS | Status: DC
  Filled 2015-10-02: qty 10

## 2015-10-02 MED ORDER — DEXMEDETOMIDINE HCL IN NACL 200 MCG/50ML IV SOLN
0.0000 ug/kg/h | INTRAVENOUS | Status: DC
  Administered 2015-10-02: 0.4 ug/kg/h via INTRAVENOUS
  Filled 2015-10-02: qty 50

## 2015-10-02 MED ORDER — MORPHINE SULFATE (PF) 2 MG/ML IV SOLN
2.0000 mg | INTRAVENOUS | Status: DC | PRN
  Administered 2015-10-03 (×2): 2 mg via INTRAVENOUS
  Administered 2015-10-03 (×4): 4 mg via INTRAVENOUS
  Filled 2015-10-02 (×4): qty 2
  Filled 2015-10-02 (×2): qty 1

## 2015-10-02 MED ORDER — SODIUM CHLORIDE 0.9 % IJ SOLN
INTRAMUSCULAR | Status: DC | PRN
  Administered 2015-10-02 (×3): 4 mL via TOPICAL

## 2015-10-02 MED ORDER — LIDOCAINE HCL (CARDIAC) 20 MG/ML IV SOLN
INTRAVENOUS | Status: DC | PRN
  Administered 2015-10-02: 100 mg via INTRAVENOUS

## 2015-10-02 MED ORDER — AMIODARONE HCL IN DEXTROSE 360-4.14 MG/200ML-% IV SOLN
60.0000 mg/h | INTRAVENOUS | Status: AC
  Administered 2015-10-02 (×2): 60 mg/h via INTRAVENOUS

## 2015-10-02 MED ORDER — BISACODYL 10 MG RE SUPP: 10.0000 mg | Freq: Every day | RECTAL | Status: DC

## 2015-10-02 MED ORDER — ANTISEPTIC ORAL RINSE SOLUTION (CORINZ)
7.0000 mL | Freq: Four times a day (QID) | OROMUCOSAL | Status: DC
  Administered 2015-10-03 (×2): 7 mL via OROMUCOSAL

## 2015-10-02 MED ORDER — METOPROLOL TARTRATE 12.5 MG HALF TABLET
12.5000 mg | ORAL_TABLET | Freq: Two times a day (BID) | ORAL | Status: DC
  Administered 2015-10-03 – 2015-10-08 (×11): 12.5 mg via ORAL
  Filled 2015-10-02 (×11): qty 1

## 2015-10-02 MED ORDER — STERILE WATER FOR INJECTION IJ SOLN
INTRAMUSCULAR | Status: AC
  Filled 2015-10-02: qty 10

## 2015-10-02 MED ORDER — DESMOPRESSIN ACETATE 4 MCG/ML IJ SOLN
20.0000 ug | INTRAMUSCULAR | Status: AC
  Administered 2015-10-02: 20 ug via INTRAVENOUS
  Filled 2015-10-02: qty 5

## 2015-10-02 MED ORDER — LACTATED RINGERS IV SOLN: 500.0000 mL | Freq: Once | INTRAVENOUS | Status: DC | PRN

## 2015-10-02 MED ORDER — FENTANYL CITRATE (PF) 100 MCG/2ML IJ SOLN
INTRAMUSCULAR | Status: DC | PRN
  Administered 2015-10-02: 250 ug via INTRAVENOUS
  Administered 2015-10-02: 800 ug via INTRAVENOUS
  Administered 2015-10-02: 100 ug via INTRAVENOUS
  Administered 2015-10-02 (×2): 250 ug via INTRAVENOUS
  Administered 2015-10-02 (×2): 100 ug via INTRAVENOUS
  Administered 2015-10-02: 150 ug via INTRAVENOUS

## 2015-10-02 MED ORDER — DEXTROSE 5 % IV SOLN
1.5000 g | Freq: Two times a day (BID) | INTRAVENOUS | Status: AC
  Administered 2015-10-02 – 2015-10-04 (×4): 1.5 g via INTRAVENOUS
  Filled 2015-10-02 (×4): qty 1.5

## 2015-10-02 MED ORDER — VANCOMYCIN HCL 1000 MG IV SOLR
1250.0000 mg | INTRAVENOUS | Status: DC | PRN
  Administered 2015-10-02: 1250 mg via INTRAVENOUS

## 2015-10-02 MED ORDER — HEPARIN SODIUM (PORCINE) 1000 UNIT/ML IJ SOLN
INTRAMUSCULAR | Status: AC
  Filled 2015-10-02: qty 1

## 2015-10-02 MED ORDER — SODIUM CHLORIDE 0.9 % IV SOLN
INTRAVENOUS | Status: DC
  Filled 2015-10-02: qty 2.5

## 2015-10-02 MED ORDER — SODIUM CHLORIDE 0.9 % IJ SOLN
INTRAMUSCULAR | Status: AC
  Filled 2015-10-02: qty 20

## 2015-10-02 MED ORDER — ARTIFICIAL TEARS OP OINT
TOPICAL_OINTMENT | OPHTHALMIC | Status: AC
  Filled 2015-10-02: qty 3.5

## 2015-10-02 MED ORDER — SODIUM CHLORIDE 0.9 % IJ SOLN: 3.0000 mL | INTRAMUSCULAR | Status: DC | PRN

## 2015-10-02 MED ORDER — SODIUM CHLORIDE 0.45 % IV SOLN
INTRAVENOUS | Status: DC | PRN
  Administered 2015-10-02: 14:00:00 via INTRAVENOUS

## 2015-10-02 MED ORDER — FAMOTIDINE IN NACL 20-0.9 MG/50ML-% IV SOLN
20.0000 mg | Freq: Two times a day (BID) | INTRAVENOUS | Status: AC
  Administered 2015-10-02 (×2): 20 mg via INTRAVENOUS
  Filled 2015-10-02: qty 50

## 2015-10-02 MED ORDER — FENTANYL CITRATE (PF) 250 MCG/5ML IJ SOLN
INTRAMUSCULAR | Status: AC
  Filled 2015-10-02: qty 30

## 2015-10-02 MED ORDER — LIDOCAINE HCL (CARDIAC) 20 MG/ML IV SOLN
INTRAVENOUS | Status: AC
  Filled 2015-10-02: qty 5

## 2015-10-02 MED ORDER — AMIODARONE LOAD VIA INFUSION
150.0000 mg | Freq: Once | INTRAVENOUS | Status: AC
  Administered 2015-10-02: 150 mg via INTRAVENOUS
  Filled 2015-10-02: qty 83.34

## 2015-10-02 MED ORDER — LACTATED RINGERS IV SOLN: INTRAVENOUS | Status: DC

## 2015-10-02 MED ORDER — PROTAMINE SULFATE 10 MG/ML IV SOLN
INTRAVENOUS | Status: DC | PRN
  Administered 2015-10-02: 320 mg via INTRAVENOUS

## 2015-10-02 MED ORDER — POTASSIUM CHLORIDE 10 MEQ/50ML IV SOLN
10.0000 meq | INTRAVENOUS | Status: AC
  Administered 2015-10-02 (×3): 10 meq via INTRAVENOUS

## 2015-10-02 MED ORDER — MILRINONE IN DEXTROSE 20 MG/100ML IV SOLN
0.1250 ug/kg/min | INTRAVENOUS | Status: AC
  Administered 2015-10-03: 0.25 ug/kg/min via INTRAVENOUS
  Filled 2015-10-02: qty 100

## 2015-10-02 MED ORDER — FENTANYL CITRATE (PF) 250 MCG/5ML IJ SOLN
INTRAMUSCULAR | Status: AC
Start: 2015-10-02 — End: 2015-10-02
  Filled 2015-10-02: qty 10

## 2015-10-02 MED ORDER — SODIUM CHLORIDE 0.9 % IV SOLN: INTRAVENOUS | Status: DC

## 2015-10-02 MED ORDER — VANCOMYCIN HCL IN DEXTROSE 1-5 GM/200ML-% IV SOLN
1000.0000 mg | Freq: Once | INTRAVENOUS | Status: AC
  Administered 2015-10-02: 1000 mg via INTRAVENOUS
  Filled 2015-10-02: qty 200

## 2015-10-02 MED ORDER — VECURONIUM BROMIDE 10 MG IV SOLR
INTRAVENOUS | Status: AC
  Filled 2015-10-02: qty 10

## 2015-10-02 MED ORDER — 0.9 % SODIUM CHLORIDE (POUR BTL) OPTIME
TOPICAL | Status: DC | PRN
  Administered 2015-10-02: 5000 mL

## 2015-10-02 MED ORDER — MIDAZOLAM HCL 2 MG/2ML IJ SOLN: 2.0000 mg | INTRAMUSCULAR | Status: DC | PRN

## 2015-10-02 MED ORDER — CHLORHEXIDINE GLUCONATE 0.12 % MT SOLN
15.0000 mL | OROMUCOSAL | Status: AC
  Administered 2015-10-02: 15 mL via OROMUCOSAL

## 2015-10-02 MED ORDER — ALBUMIN HUMAN 5 % IV SOLN
12.5000 g | Freq: Once | INTRAVENOUS | Status: AC
  Administered 2015-10-02: 12.5 g via INTRAVENOUS

## 2015-10-02 MED ORDER — MIDAZOLAM HCL 2 MG/2ML IJ SOLN
INTRAMUSCULAR | Status: AC
  Administered 2015-10-02: 5 mg via INTRAVENOUS
  Administered 2015-10-02: 3 mg via INTRAVENOUS
  Administered 2015-10-02: 2 mg via INTRAVENOUS
  Administered 2015-10-02 (×2): 3 mg via INTRAVENOUS
  Administered 2015-10-02 (×3): 2 mg via INTRAVENOUS
  Filled 2015-10-02: qty 2

## 2015-10-02 MED ORDER — PROTAMINE SULFATE 10 MG/ML IV SOLN
INTRAVENOUS | Status: AC
  Filled 2015-10-02: qty 5

## 2015-10-02 MED ORDER — ACETAMINOPHEN 650 MG RE SUPP
650.0000 mg | Freq: Once | RECTAL | Status: AC
  Administered 2015-10-02: 650 mg via RECTAL

## 2015-10-02 MED ORDER — DEXTROSE 5 % IV SOLN
1.5000 g | INTRAVENOUS | Status: DC | PRN
  Administered 2015-10-02: 1.5 g via INTRAVENOUS
  Administered 2015-10-02: .75 g via INTRAVENOUS

## 2015-10-02 MED ORDER — MILRINONE IN DEXTROSE 20 MG/100ML IV SOLN
0.3750 ug/kg/min | INTRAVENOUS | Status: AC
  Administered 2015-10-02: 0.25 ug/kg/min via INTRAVENOUS
  Filled 2015-10-02: qty 100

## 2015-10-02 MED ORDER — VECURONIUM BROMIDE 10 MG IV SOLR
INTRAVENOUS | Status: AC
Start: 2015-10-02 — End: 2015-10-02
  Filled 2015-10-02: qty 10

## 2015-10-02 MED ORDER — MAGNESIUM SULFATE 4 GM/100ML IV SOLN
4.0000 g | Freq: Once | INTRAVENOUS | Status: AC
  Administered 2015-10-02: 4 g via INTRAVENOUS
  Filled 2015-10-02: qty 100

## 2015-10-02 MED ORDER — PLASMA-LYTE 148 IV SOLN
INTRAVENOUS | Status: DC | PRN
  Administered 2015-10-02 (×2): 500 mL via INTRAVENOUS

## 2015-10-02 MED ORDER — LACTATED RINGERS IV SOLN
INTRAVENOUS | Status: DC | PRN
  Administered 2015-10-02: 07:00:00 via INTRAVENOUS

## 2015-10-02 MED ORDER — METOPROLOL TARTRATE 1 MG/ML IV SOLN: 2.5000 mg | INTRAVENOUS | Status: DC | PRN

## 2015-10-02 MED ORDER — OXYCODONE HCL 5 MG PO TABS
5.0000 mg | ORAL_TABLET | ORAL | Status: DC | PRN
  Administered 2015-10-03: 5 mg via ORAL
  Administered 2015-10-05 – 2015-10-08 (×11): 10 mg via ORAL
  Filled 2015-10-02 (×12): qty 2

## 2015-10-02 MED ORDER — PROPOFOL 10 MG/ML IV BOLUS
INTRAVENOUS | Status: AC
  Filled 2015-10-02: qty 40

## 2015-10-02 MED ORDER — DOPAMINE-DEXTROSE 1.6-5 MG/ML-% IV SOLN
INTRAVENOUS | Status: DC | PRN
  Administered 2015-10-02: 3 ug/kg/min via INTRAVENOUS

## 2015-10-02 MED ORDER — DOCUSATE SODIUM 100 MG PO CAPS
200.0000 mg | ORAL_CAPSULE | Freq: Every day | ORAL | Status: DC
  Administered 2015-10-03 – 2015-10-08 (×3): 200 mg via ORAL
  Filled 2015-10-02 (×5): qty 2

## 2015-10-02 MED ORDER — CHLORHEXIDINE GLUCONATE 0.12% ORAL RINSE (MEDLINE KIT)
15.0000 mL | Freq: Two times a day (BID) | OROMUCOSAL | Status: DC
  Administered 2015-10-02 – 2015-10-03 (×2): 15 mL via OROMUCOSAL

## 2015-10-02 MED ORDER — TRAMADOL HCL 50 MG PO TABS
50.0000 mg | ORAL_TABLET | ORAL | Status: DC | PRN
  Administered 2015-10-03 – 2015-10-08 (×3): 100 mg via ORAL
  Filled 2015-10-02 (×3): qty 2

## 2015-10-02 MED ORDER — BISACODYL 5 MG PO TBEC
10.0000 mg | DELAYED_RELEASE_TABLET | Freq: Every day | ORAL | Status: DC
  Administered 2015-10-03 – 2015-10-04 (×2): 10 mg via ORAL
  Filled 2015-10-02 (×5): qty 2

## 2015-10-02 MED ORDER — VECURONIUM BROMIDE 10 MG IV SOLR
INTRAVENOUS | Status: DC | PRN
  Administered 2015-10-02 (×5): 5 mg via INTRAVENOUS

## 2015-10-02 MED ORDER — SODIUM CHLORIDE 0.9 % IJ SOLN
3.0000 mL | Freq: Two times a day (BID) | INTRAMUSCULAR | Status: DC
  Administered 2015-10-03 – 2015-10-07 (×6): 3 mL via INTRAVENOUS

## 2015-10-02 MED ORDER — MIDAZOLAM HCL 10 MG/2ML IJ SOLN
INTRAMUSCULAR | Status: AC
  Filled 2015-10-02: qty 2

## 2015-10-02 MED ORDER — LEVALBUTEROL HCL 1.25 MG/0.5ML IN NEBU
1.2500 mg | INHALATION_SOLUTION | Freq: Four times a day (QID) | RESPIRATORY_TRACT | Status: DC
  Administered 2015-10-02 – 2015-10-04 (×7): 1.25 mg via RESPIRATORY_TRACT
  Filled 2015-10-02 (×6): qty 0.5

## 2015-10-02 MED ORDER — METOPROLOL TARTRATE 25 MG/10 ML ORAL SUSPENSION
12.5000 mg | Freq: Two times a day (BID) | ORAL | Status: DC
  Filled 2015-10-02: qty 10
  Filled 2015-10-02: qty 5

## 2015-10-02 MED ORDER — SODIUM CHLORIDE 0.9 % IV SOLN
INTRAVENOUS | Status: DC | PRN
  Administered 2015-10-02: 13:00:00 via INTRAVENOUS

## 2015-10-02 MED ORDER — ACETAMINOPHEN 160 MG/5ML PO SOLN: 1000.0000 mg | Freq: Four times a day (QID) | ORAL | Status: AC

## 2015-10-02 MED ORDER — AMIODARONE HCL IN DEXTROSE 360-4.14 MG/200ML-% IV SOLN
INTRAVENOUS | Status: AC
  Filled 2015-10-02: qty 400

## 2015-10-02 MED ORDER — ROCURONIUM BROMIDE 100 MG/10ML IV SOLN
INTRAVENOUS | Status: DC | PRN
  Administered 2015-10-02: 50 mg via INTRAVENOUS

## 2015-10-02 MED ORDER — HEPARIN SODIUM (PORCINE) 1000 UNIT/ML IJ SOLN
INTRAMUSCULAR | Status: DC | PRN
  Administered 2015-10-02: 31000 [IU] via INTRAVENOUS
  Administered 2015-10-02: 3000 [IU] via INTRAVENOUS
  Administered 2015-10-02: 2000 [IU] via INTRAVENOUS

## 2015-10-02 MED ORDER — PANTOPRAZOLE SODIUM 40 MG PO TBEC
40.0000 mg | DELAYED_RELEASE_TABLET | Freq: Every day | ORAL | Status: DC
  Administered 2015-10-04 – 2015-10-08 (×5): 40 mg via ORAL
  Filled 2015-10-02 (×5): qty 1

## 2015-10-02 MED ORDER — ACETAMINOPHEN 160 MG/5ML PO SOLN: 650.0000 mg | Freq: Once | ORAL | Status: AC

## 2015-10-02 MED ORDER — ARTIFICIAL TEARS OP OINT
TOPICAL_OINTMENT | OPHTHALMIC | Status: DC | PRN
  Administered 2015-10-02: 1 via OPHTHALMIC

## 2015-10-02 MED ORDER — PHENYLEPHRINE 40 MCG/ML (10ML) SYRINGE FOR IV PUSH (FOR BLOOD PRESSURE SUPPORT)
PREFILLED_SYRINGE | INTRAVENOUS | Status: AC
  Filled 2015-10-02: qty 10

## 2015-10-02 MED ORDER — SODIUM CHLORIDE 0.9 % IV SOLN: 250.0000 mL | INTRAVENOUS | Status: DC

## 2015-10-02 MED ORDER — PROTAMINE SULFATE 10 MG/ML IV SOLN
INTRAVENOUS | Status: AC
  Filled 2015-10-02: qty 25

## 2015-10-02 MED ORDER — ASPIRIN 81 MG PO CHEW
324.0000 mg | CHEWABLE_TABLET | Freq: Every day | ORAL | Status: DC
  Administered 2015-10-05: 324 mg
  Filled 2015-10-02 (×2): qty 4

## 2015-10-02 MED ORDER — PHENYLEPHRINE HCL 10 MG/ML IJ SOLN: 0.0000 ug/min | INTRAVENOUS | Status: DC

## 2015-10-02 MED ORDER — PAPAVERINE HCL 30 MG/ML IJ SOLN
INTRAMUSCULAR | Status: DC
  Filled 2015-10-02: qty 2.5

## 2015-10-02 MED ORDER — AMIODARONE HCL IN DEXTROSE 360-4.14 MG/200ML-% IV SOLN
30.0000 mg/h | INTRAVENOUS | Status: DC
  Administered 2015-10-03 (×2): 30 mg/h via INTRAVENOUS
  Filled 2015-10-02 (×3): qty 200

## 2015-10-02 MED ORDER — NITROPRUSSIDE SODIUM 25 MG/ML IV SOLN
0.0000 ug/kg/min | INTRAVENOUS | Status: DC
  Filled 2015-10-02: qty 2

## 2015-10-02 MED ORDER — LEVALBUTEROL HCL 1.25 MG/0.5ML IN NEBU: 1.2500 mg | INHALATION_SOLUTION | Freq: Four times a day (QID) | RESPIRATORY_TRACT | Status: DC

## 2015-10-02 MED ORDER — ASPIRIN EC 325 MG PO TBEC
325.0000 mg | DELAYED_RELEASE_TABLET | Freq: Every day | ORAL | Status: DC
  Administered 2015-10-03 – 2015-10-08 (×5): 325 mg via ORAL
  Filled 2015-10-02 (×6): qty 1

## 2015-10-02 MED ORDER — DOPAMINE-DEXTROSE 3.2-5 MG/ML-% IV SOLN: 0.0000 ug/kg/min | INTRAVENOUS | Status: DC

## 2015-10-02 MED FILL — Heparin Sodium (Porcine) Inj 1000 Unit/ML: INTRAMUSCULAR | Qty: 30 | Status: AC

## 2015-10-02 MED FILL — Electrolyte-R (PH 7.4) Solution: INTRAVENOUS | Qty: 5000 | Status: AC

## 2015-10-02 MED FILL — Sodium Bicarbonate IV Soln 8.4%: INTRAVENOUS | Qty: 50 | Status: AC

## 2015-10-02 MED FILL — Potassium Chloride Inj 2 mEq/ML: INTRAVENOUS | Qty: 40 | Status: AC

## 2015-10-02 MED FILL — Lidocaine HCl IV Inj 20 MG/ML: INTRAVENOUS | Qty: 5 | Status: AC

## 2015-10-02 MED FILL — Magnesium Sulfate Inj 50%: INTRAMUSCULAR | Qty: 10 | Status: AC

## 2015-10-02 MED FILL — Mannitol IV Soln 20%: INTRAVENOUS | Qty: 500 | Status: AC

## 2015-10-02 MED FILL — Sodium Chloride IV Soln 0.9%: INTRAVENOUS | Qty: 2000 | Status: AC

## 2015-10-02 SURGICAL SUPPLY — 98 items
ADAPTER CARDIO PERF ANTE/RETRO (ADAPTER) ×4 IMPLANT
ADPR PRFSN 84XANTGRD RTRGD (ADAPTER) ×2
BAG DECANTER FOR FLEXI CONT (MISCELLANEOUS) ×4 IMPLANT
BANDAGE ELASTIC 4 VELCRO ST LF (GAUZE/BANDAGES/DRESSINGS) ×6 IMPLANT
BANDAGE ELASTIC 6 VELCRO ST LF (GAUZE/BANDAGES/DRESSINGS) ×6 IMPLANT
BASKET HEART  (ORDER IN 25'S) (MISCELLANEOUS) ×1
BASKET HEART (ORDER IN 25'S) (MISCELLANEOUS) ×1
BASKET HEART (ORDER IN 25S) (MISCELLANEOUS) ×2 IMPLANT
BLADE STERNUM SYSTEM 6 (BLADE) ×4 IMPLANT
BLADE SURG 12 STRL SS (BLADE) ×4 IMPLANT
BNDG GAUZE ELAST 4 BULKY (GAUZE/BANDAGES/DRESSINGS) ×6 IMPLANT
CANISTER SUCTION 2500CC (MISCELLANEOUS) ×4 IMPLANT
CANNULA GUNDRY RCSP 15FR (MISCELLANEOUS) ×4 IMPLANT
CANNULA VESSEL 3MM BLUNT TIP (CANNULA) ×8 IMPLANT
CATH CPB KIT VANTRIGT (MISCELLANEOUS) ×4 IMPLANT
CATH ROBINSON RED A/P 18FR (CATHETERS) ×12 IMPLANT
CATH THORACIC 36FR RT ANG (CATHETERS) ×4 IMPLANT
CLIP FOGARTY SPRING 6M (CLIP) ×10 IMPLANT
CLIP RETRACTION 3.0MM CORONARY (MISCELLANEOUS) ×2 IMPLANT
CLIP TI WIDE RED SMALL 24 (CLIP) ×6 IMPLANT
COVER SURGICAL LIGHT HANDLE (MISCELLANEOUS) ×4 IMPLANT
CRADLE DONUT ADULT HEAD (MISCELLANEOUS) ×4 IMPLANT
DRAIN CHANNEL 32F RND 10.7 FF (WOUND CARE) ×4 IMPLANT
DRAPE CARDIOVASCULAR INCISE (DRAPES) ×4
DRAPE SLUSH/WARMER DISC (DRAPES) ×4 IMPLANT
DRAPE SRG 135X102X78XABS (DRAPES) ×2 IMPLANT
DRSG AQUACEL AG ADV 3.5X14 (GAUZE/BANDAGES/DRESSINGS) ×4 IMPLANT
ELECT BLADE 4.0 EZ CLEAN MEGAD (MISCELLANEOUS) ×4
ELECT BLADE 6.5 EXT (BLADE) ×6 IMPLANT
ELECT CAUTERY BLADE 6.4 (BLADE) ×4 IMPLANT
ELECT REM PT RETURN 9FT ADLT (ELECTROSURGICAL) ×8
ELECTRODE BLDE 4.0 EZ CLN MEGD (MISCELLANEOUS) ×2 IMPLANT
ELECTRODE REM PT RTRN 9FT ADLT (ELECTROSURGICAL) ×4 IMPLANT
GAUZE SPONGE 4X4 12PLY STRL (GAUZE/BANDAGES/DRESSINGS) ×8 IMPLANT
GLOVE BIO SURGEON STRL SZ 6 (GLOVE) ×16 IMPLANT
GLOVE BIO SURGEON STRL SZ 6.5 (GLOVE) ×5 IMPLANT
GLOVE BIO SURGEON STRL SZ7.5 (GLOVE) ×16 IMPLANT
GLOVE BIO SURGEONS STRL SZ 6.5 (GLOVE) ×5
GLOVE BIOGEL PI IND STRL 6 (GLOVE) IMPLANT
GLOVE BIOGEL PI IND STRL 6.5 (GLOVE) IMPLANT
GLOVE BIOGEL PI INDICATOR 6 (GLOVE) ×8
GLOVE BIOGEL PI INDICATOR 6.5 (GLOVE) ×8
GOWN STRL REUS W/ TWL LRG LVL3 (GOWN DISPOSABLE) ×8 IMPLANT
GOWN STRL REUS W/TWL LRG LVL3 (GOWN DISPOSABLE) ×48
HEMOSTAT POWDER SURGIFOAM 1G (HEMOSTASIS) ×12 IMPLANT
HEMOSTAT SURGICEL 2X14 (HEMOSTASIS) ×4 IMPLANT
KIT BASIN OR (CUSTOM PROCEDURE TRAY) ×4 IMPLANT
KIT ROOM TURNOVER OR (KITS) ×4 IMPLANT
KIT SUCTION CATH 14FR (SUCTIONS) ×6 IMPLANT
KIT VASOVIEW W/TROCAR VH 2000 (KITS) ×4 IMPLANT
LEAD PACING MYOCARDI (MISCELLANEOUS) ×4 IMPLANT
MARKER GRAFT CORONARY BYPASS (MISCELLANEOUS) ×14 IMPLANT
NS IRRIG 1000ML POUR BTL (IV SOLUTION) ×20 IMPLANT
PACK OPEN HEART (CUSTOM PROCEDURE TRAY) ×4 IMPLANT
PAD ARMBOARD 7.5X6 YLW CONV (MISCELLANEOUS) ×8 IMPLANT
PAD ELECT DEFIB RADIOL ZOLL (MISCELLANEOUS) ×4 IMPLANT
PENCIL BUTTON HOLSTER BLD 10FT (ELECTRODE) ×4 IMPLANT
PUNCH AORTIC ROT 4.0MM RCL 40 (MISCELLANEOUS) ×2 IMPLANT
SET CARDIOPLEGIA MPS 5001102 (MISCELLANEOUS) ×2 IMPLANT
SPONGE GAUZE 4X4 12PLY STER LF (GAUZE/BANDAGES/DRESSINGS) ×2 IMPLANT
SURGIFLO W/THROMBIN 8M KIT (HEMOSTASIS) ×4 IMPLANT
SUT BONE WAX W31G (SUTURE) ×4 IMPLANT
SUT ETHIBOND 2 0 SH (SUTURE) ×16
SUT ETHIBOND 2 0 SH 36X2 (SUTURE) IMPLANT
SUT PROLENE 4 0 RB 1 (SUTURE) ×12
SUT PROLENE 4 0 SH DA (SUTURE) ×4 IMPLANT
SUT PROLENE 4-0 RB1 .5 CRCL 36 (SUTURE) ×2 IMPLANT
SUT PROLENE 5 0 C 1 36 (SUTURE) ×2 IMPLANT
SUT PROLENE 6 0 C 1 30 (SUTURE) ×12 IMPLANT
SUT PROLENE 6 0 CC (SUTURE) ×14 IMPLANT
SUT PROLENE 8 0 BV175 6 (SUTURE) ×12 IMPLANT
SUT PROLENE BLUE 7 0 (SUTURE) ×8 IMPLANT
SUT SILK  1 MH (SUTURE) ×6
SUT SILK 1 MH (SUTURE) IMPLANT
SUT SILK 1 TIES 10X30 (SUTURE) ×4 IMPLANT
SUT SILK 2 0 SH CR/8 (SUTURE) ×6 IMPLANT
SUT SILK 2 0 TIES 10X30 (SUTURE) ×2 IMPLANT
SUT SILK 3 0 SH CR/8 (SUTURE) ×4 IMPLANT
SUT SILK 4 0 TIE 10X30 (SUTURE) ×4 IMPLANT
SUT STEEL 6MS V (SUTURE) ×6 IMPLANT
SUT STEEL SZ 6 DBL 3X14 BALL (SUTURE) ×4 IMPLANT
SUT TEM PAC WIRE 2 0 SH (SUTURE) ×8 IMPLANT
SUT VIC AB 1 CTX 36 (SUTURE) ×8
SUT VIC AB 1 CTX36XBRD ANBCTR (SUTURE) ×4 IMPLANT
SUT VIC AB 2-0 CT1 27 (SUTURE) ×12
SUT VIC AB 2-0 CT1 TAPERPNT 27 (SUTURE) IMPLANT
SUT VIC AB 2-0 CTX 27 (SUTURE) ×4 IMPLANT
SUT VIC AB 3-0 X1 27 (SUTURE) ×4 IMPLANT
SYSTEM SAHARA CHEST DRAIN ATS (WOUND CARE) ×4 IMPLANT
TAPE CLOTH SURG 4X10 WHT LF (GAUZE/BANDAGES/DRESSINGS) ×2 IMPLANT
TAPE CLOTH SURG 6X10 WHT LF (GAUZE/BANDAGES/DRESSINGS) ×2 IMPLANT
TAPE PAPER 2X10 WHT MICROPORE (GAUZE/BANDAGES/DRESSINGS) ×2 IMPLANT
TOWEL OR 17X24 6PK STRL BLUE (TOWEL DISPOSABLE) ×8 IMPLANT
TOWEL OR 17X26 10 PK STRL BLUE (TOWEL DISPOSABLE) ×8 IMPLANT
TRAY FOLEY IC TEMP SENS 16FR (CATHETERS) ×4 IMPLANT
TUBING INSUFFLATION (TUBING) ×4 IMPLANT
UNDERPAD 30X30 INCONTINENT (UNDERPADS AND DIAPERS) ×4 IMPLANT
WATER STERILE IRR 1000ML POUR (IV SOLUTION) ×8 IMPLANT

## 2015-10-02 NOTE — Op Note (Signed)
NAMEELIEZAR, HOCKADAY NO.:  [id number]  MEDICAL RECORD NO.:  [id number]  LOCATION:  2S09C                        FACILITY:  [facility]  PHYSICIAN:  Ivin Poot, M.D.  DATE OF BIRTH:  1946-05-08  DATE OF PROCEDURE:  10/02/2015 DATE OF DISCHARGE:                              OPERATIVE REPORT   OPERATION: 1. Coronary artery bypass grafting x5 (left internal mammary artery to     left anterior descending coronary artery, saphenous vein graft to     diagonal, saphenous vein graft to ramus intermediate, saphenous     vein graft to circumflex marginal, saphenous vein graft to     posterior descending). 2. Endoscopic harvest of bilateral greater saphenous vein.  PREOPERATIVE DIAGNOSES:  Unstable angina, non-ST elevation myocardial infarction, severe three-vessel coronary artery disease, diabetic coronary vasculopathy.  SURGEON:  Ivin Poot, MD  ASSISTANT:  John Giovanni, [name]  ANESTHESIA:  General by Crissie Sickles Conrad [place], MD  CLINICAL NOTE:  The patient is a 70 year old, Caucasian male, nonsmoker, poorly controlled diabetic who presents with symptoms of chest pain, neck pain, and chest tightness.  His symptoms were both with exertion and at rest.  Upon presentation, his cardiac enzymes were positive.  The EKG showed nonspecific changes.  Echocardiogram showed some mild inferior hypokinesia.  He underwent cardiac catheterization which showed severe 3-vessel coronary artery disease in a diabetic type pattern.  He was felt to be candidate for surgical revascularization.  I saw the patient in his hospital room and reviewed the results of cardiac catheterization and echo with the patient and his family.  I discussed the indications and expected benefits of coronary artery bypass surgery for treatment of his severe coronary artery disease.  I discussed the major aspects of the operation including the use of general anesthesia and cardiopulmonary bypass, the location of  the surgical incisions, and the expected postoperative hospital recovery.  I discussed with the patient, the risks to him of coronary artery bypass surgery including risks of MI, stroke, bleeding, blood transfusion requirement, infection, postoperative pulmonary problems including pleural effusions or ventilator dependence, postoperative infection, and death.  We discussed the recent issue of postop infection and cardiac surgery patients from an atypical mycobacteria strain isolated from heater cooler equipment used in heart surgery.  He understood that the risk for this infection would be extremely low and that none has been reported in our center. After reviewing all of these issues, he demonstrated his understanding, agreed to proceed with surgery under what I felt was an informed consent.  FINDINGS: 1. Severe diabetic pattern of disease with suboptimal targets in the     posterior descending circumflex and ramus intermediate vessels. 2. Adequate conduit. 3. Intraoperative anemia requiring 2 unit packed cell transfusion at     the beginning of the operation.  OPERATIVE PROCEDURE:  The patient was brought to the operating room and placed supine on the operating table.  General anesthesia was induced under invasive hemodynamic monitoring.  The chest, abdomen, and legs were prepped with Betadine and draped as a sterile field.  A transesophageal echo probe was placed by the anesthesiologist.  There were no significant valvular abnormalities noted.  The  patient was prepped and draped and a proper time-out was performed.  A sternal incision was made as the saphenous vein was harvested endoscopically from the right and left legs.  The left internal mammary artery was harvested as a pedicle graft.  There was a small 1.4-mm vessel, but had excellent flow.  The sternal retractor was placed and the pericardium was opened.  The pericardium was suspended.  Pursestrings were placed in the  ascending aorta and right atrium.  Heparin was administered.  When the ACT was documented as being therapeutic, the patient was cannulated and placed on cardiopulmonary bypass.  The coronaries were identified for grafting.  The mammary artery and vein grafts were prepared for the distal anastomoses.  Cardioplegia cannulas were placed for both antegrade and retrograde cold blood cardioplegia.  The patient was cooled to 32 degrees.  Aortic crossclamp was applied.  One liter of cold blood cardioplegia was delivered in split doses between the antegrade aortic and retrograde coronary sinus catheters.  There was good cardioplegic arrest and septal temperature dropped less than 14 degrees. Cardioplegia was delivered every 20 minutes or less while the crossclamp was placed.  The distal coronary anastomoses were performed.  The first distal anastomosis was to the posterior descending branch of right coronary. There was a small 1.1 mm vessel proximal 90% stenosis.  A reverse saphenous vein was sewn end-to-side with running 8-0 Prolene.  There was adequate flow through the graft.  Cardioplegia was redosed.  The second distal anastomosis was to the circumflex marginal distal vessel.  There was a 1.1 mm vessel with proximal 80% stenosis.  A reverse saphenous vein was sewn end-to-side with running 8-0 Prolene. There was adequate flow through the graft.  Cardioplegia was redosed.  The third distal anastomosis was the ramus branch of the left coronary. It had proximal 80%-90% stenosis.  There was a 1.0 mm vessel.  A reverse saphenous vein was sewn end-to-side with running 8-0 Prolene.  There was adequate flow through the graft.  Cardioplegia was redosed.  The fourth distal anastomosis was the first diagonal branch to LAD. There was a 1.5-mm vessel with a proximal 90% stenosis.  A reverse saphenous vein was sewn end-to-side with running 7-0 Prolene.  There was good flow through the graft.   Cardioplegia was redosed.  The 5th distal anastomosis was to the mid LAD.  There was a 1.5-mm vessel proximal 95% stenosis.  The left IMA pedicle was brought through an opening created in the left lateral pericardium, was brought down onto the LAD and sewn end-to-side with running 8-0 Prolene.  There was good flow through the anastomosis after briefly releasing the pedicle bulldog on the mammary artery.  The bulldog was reapplied and the pedicle was secured to epicardium with 6-0 Prolene's.  Cardioplegia was redosed.  While the crossclamp was still in place, 4 proximal vein anastomoses were performed using a 4.0 mm punch running 6-0 Prolene.  Prior to tying down the final proximal anastomosis, air was vented from coronaries with a dose of retrograde warm blood cardioplegia and the usual de-airing maneuvers.  The crossclamp was removed. The vein grafts were de-aired and opened and each had good flow and hemostasis was documented at the proximal and distal anastomoses.  The heart resumed a spontaneous rhythm.  The patient was rewarmed and reperfused.  Temporary pacing wires were applied.  The lungs were expanded and after adequate reperfusion, the ventilator was resumed. The patient was then weaned off cardiopulmonary bypass on low-dose dopamine and  Milrinone without difficulty.  Cardiac output was 6 L. Echo showed preserved normal LV function.  Protamine was administered without adverse reaction.  The cannulas were removed.  The mediastinum was irrigated with warm saline.  After reversal of heparin, there was still coagulopathy.  Platelet count was 115,000.  We gave 2 units FFP with improved coagulation function.  The superior pericardial fat was closed over the aorta and vein grafts. Anterior mediastinal and left pleural chest tubes were placed and brought out through separate incisions.  The sternum was closed with interrupted steel wire.  The pectoralis fascia was closed with a  running #1 Vicryl.  The subcutaneous and skin layers were closed in running Vicryl.  Total cardiopulmonary bypass time was 150 minutes.     Ivin Poot, M.D.     PV/MEDQ  D:  10/02/2015  T:  10/02/2015  Job:  [id number]

## 2015-10-02 NOTE — Anesthesia Preprocedure Evaluation (Addendum)
Anesthesia Evaluation  Patient identified by MRN, date of birth, ID band Patient awake    Reviewed: Allergy & Precautions, NPO status , Patient's Chart, lab work & pertinent test results  Airway Mallampati: I  TM Distance: >3 FB Neck ROM: Full    Dental   Pulmonary neg pulmonary ROS,    Pulmonary exam normal        Cardiovascular hypertension, + CAD and + Past MI  negative cardio ROS Normal cardiovascular exam     Neuro/Psych negative neurological ROS  negative psych ROS   GI/Hepatic negative GI ROS, Neg liver ROS,   Endo/Other  negative endocrine ROSdiabetes, Type 2, Oral Hypoglycemic Agents  Renal/GU negative Renal ROS     Musculoskeletal negative musculoskeletal ROS (+)   Abdominal   Peds  Hematology negative hematology ROS (+)   Anesthesia Other Findings Day of surgery medications reviewed with the patient.  Reproductive/Obstetrics                            Anesthesia Physical Anesthesia Plan  ASA: III  Anesthesia Plan: General   Post-op Pain Management:    Induction: Intravenous  Airway Management Planned: Oral ETT  Additional Equipment: Arterial line, CVP, PA Cath, TEE and Ultrasound Guidance Line Placement  Intra-op Plan:   Post-operative Plan: Post-operative intubation/ventilation  Informed Consent: I have reviewed the patients History and Physical, chart, labs and discussed the procedure including the risks, benefits and alternatives for the proposed anesthesia with the patient or authorized representative who has indicated his/her understanding and acceptance.     Plan Discussed with: CRNA and Surgeon  Anesthesia Plan Comments:         Anesthesia Quick Evaluation

## 2015-10-02 NOTE — Anesthesia Postprocedure Evaluation (Signed)
Anesthesia Post Note  Patient: Fernando Hess  Procedure(s) Performed: Procedure(s) (LRB): CORONARY ARTERY BYPASS GRAFTING (CABG) X5 LIMA-LAD; SVG-DIAG; SVG-OM; SVG-PD; SVG-RAMUS TRANSESOPHAGEAL ECHOCARDIOGRAM (TEE) ENDOSCOPIC GREATER SAPHENOUS VEIN HARVEST BILAT LE (N/A) TRANSESOPHAGEAL ECHOCARDIOGRAM (TEE) (N/A)  Patient location during evaluation: SICU Anesthesia Type: General Level of consciousness: sedated Pain management: pain level controlled Vital Signs Assessment: post-procedure vital signs reviewed and stable Respiratory status: patient remains intubated per anesthesia plan Cardiovascular status: stable Anesthetic complications: no    Last Vitals:  Filed Vitals:   10/02/15 1600 10/02/15 1615  BP: 84/62   Pulse: 81 86  Temp: 36.2 C 36.2 C  Resp: 12 12    Last Pain:  Filed Vitals:   10/02/15 1615  PainSc: 0-No pain                 Deldrick Linch DAVID

## 2015-10-02 NOTE — OR Nursing (Signed)
1st call made to 2S (spoke with Tamela Oddi) @1245 , providing a patient update 2nd call made to 2S (spoke with Patty) @1305 , providing a patient update and confirming plans for transfer to Room 9 3rd call made to 2S (spoke with Patty) @1320 , providing a patient update 4th call made to 2S (spoke with Tamela Oddi) @1400 , plans for transport to Room 9.

## 2015-10-02 NOTE — Progress Notes (Signed)
Echocardiogram Echocardiogram Transesophageal has been performed.  Joelene Millin 10/02/2015, 10:09 AM

## 2015-10-02 NOTE — Progress Notes (Signed)
Patient ID: Fernando Hess, male   DOB: 07-13-1946, 70 y.o.   MRN: [id number]   SICU Evening Rounds:   Hemodynamically stable on dop 3, milrinone 0.25. CI = 2.1  Has started to wake up on vent.   Urine output good  CT output low  CBC    Component Value Date/Time   WBC 7.8 10/02/2015 1415   RBC 3.39* 10/02/2015 1415   RBC 4.40 10/04/2009 1443   HGB 9.9* 10/02/2015 1423   HCT 29.0* 10/02/2015 1423   PLT 118* 10/02/2015 1415   MCV 83.2 10/02/2015 1415   MCH 28.0 10/02/2015 1415   MCHC 33.7 10/02/2015 1415   RDW 18.0* 10/02/2015 1415   LYMPHSABS 1.6 12/19/2009 0837   MONOABS 0.6 12/19/2009 0837   EOSABS 0.5 12/19/2009 0837   BASOSABS 0.1 12/19/2009 0837     BMET    Component Value Date/Time   NA 141 10/02/2015 1423   K 3.6 10/02/2015 1423   CL 102 10/02/2015 1306   CO2 26 10/02/2015 0418   GLUCOSE 136* 10/02/2015 1423   BUN 13 10/02/2015 1306   CREATININE 0.80 10/02/2015 1306   CALCIUM 9.4 10/02/2015 0418   GFRNONAA 53* 10/02/2015 0418   GFRAA >60 10/02/2015 0418     A/P:  Stable postop course. Continue current plans

## 2015-10-02 NOTE — Progress Notes (Signed)
The patient was examined and preop studies reviewed. There has been no change from the prior exam and the patient is ready for surgery.  Plan CABG on L [place]

## 2015-10-02 NOTE — Care Management Note (Signed)
Case Management Note  Patient Details  Name: CORNELL LUTMAN MRN: [id number] Date of Birth: [birth year]  Subjective/Objective:     Initial note by Waunita Schooner, CM.  Pt admitted with NON STEMI, pt is scheduled for CABG 10/02/15             Action/Plan:  Pt is independent from home with wife.  Wife will provide 24 hour supervision post discharge.  CM will continue to monitor for disposition needs   Expected Discharge Date:  10-08-15               Expected Discharge Plan:  Home/Self Care  In-House Referral:     Discharge planning Services  CM Consult  Post Acute Care Choice:    Choice offered to:     DME Arranged:    DME Agency:     HH Arranged:    HH Agency:     Status of Service:  In process, will continue to follow  Medicare Important Message Given:  Yes Date Medicare IM Given:    Medicare IM give by:    Date Additional Medicare IM Given:    Additional Medicare Important Message give by:     If discussed at [place] of Stay Meetings, dates discussed:    Additional Comments: Post op CABG on 1-17. Vergie Living, RN 10/02/2015, 2:15 PM  [phone]

## 2015-10-02 NOTE — Anesthesia Postprocedure Evaluation (Deleted)
Anesthesia Post Note  Patient: Fernando Hess  Procedure(s) Performed: Procedure(s) (LRB): CORONARY ARTERY BYPASS GRAFTING (CABG) X5 LIMA-LAD; SVG-DIAG; SVG-OM; SVG-PD; SVG-RAMUS TRANSESOPHAGEAL ECHOCARDIOGRAM (TEE) ENDOSCOPIC GREATER SAPHENOUS VEIN HARVEST BILAT LE (N/A) TRANSESOPHAGEAL ECHOCARDIOGRAM (TEE) (N/A)  Patient location during evaluation: PACU Anesthesia Type: General Level of consciousness: awake and alert Pain management: pain level controlled Vital Signs Assessment: post-procedure vital signs reviewed and stable Respiratory status: spontaneous breathing, nonlabored ventilation, respiratory function stable and patient connected to nasal cannula oxygen Cardiovascular status: blood pressure returned to baseline and stable Postop Assessment: no signs of nausea or vomiting Anesthetic complications: no    Last Vitals:  Filed Vitals:   10/02/15 1530 10/02/15 1545  BP:    Pulse: 81 84  Temp: 36.1 C 36.2 C  Resp: 12 12    Last Pain:  Filed Vitals:   10/02/15 1551  PainSc: 0-No pain                 [name] DAVID

## 2015-10-02 NOTE — Brief Op Note (Signed)
09/24/2015 - 10/02/2015      [address]       [place] [address]             [phone]     09/24/2015 - 10/02/2015  12:21 PM  PATIENT:  Fernando Hess  70 y.o. male  PRE-OPERATIVE DIAGNOSIS:  CAD  POST-OPERATIVE DIAGNOSIS:  CAD  PROCEDURE:  Procedure(s): CORONARY ARTERY BYPASS GRAFTING (CABG) X5 LIMA-LAD; SVG-DIAG; SVG-OM; SVG-PD; SVG-RAMUS TRANSESOPHAGEAL ECHOCARDIOGRAM (TEE) ENDOSCOPIC GREATER SAPHENOUS VEIN  HARVEST BILAT LE  SURGEON:  Surgeon(s): Ivin Poot, MD  PHYSICIAN ASSISTANT: WAYNE GOLD [name]  ANESTHESIA:   general  PATIENT CONDITION:  ICU - intubated and hemodynamically stable.  PRE-OPERATIVE WEIGHT: [id number]  COMPLICATIONS: NO KNOWN

## 2015-10-02 NOTE — Anesthesia Procedure Notes (Addendum)
Central Venous Catheter Insertion Performed by: anesthesiologist 10/02/2015 6:37 AM Patient location: Pre-op. Preanesthetic checklist: patient identified, IV checked, site marked, risks and benefits discussed, surgical consent, monitors and equipment checked, pre-op evaluation, timeout performed and anesthesia consent Position: Trendelenburg Lidocaine 1% used for infiltration Landmarks identified and Seldinger technique used Catheter size: 8.5 Fr Central line was placed.Sheath introducer Procedure performed using ultrasound guided technique. Attempts: 1 Following insertion, line sutured and dressing applied. Post procedure assessment: blood return through all ports, free fluid flow and no air. Patient tolerated the procedure well with no immediate complications.    Central Venous Catheter Insertion Performed by: anesthesiologist 10/02/2015 6:39 AM Patient location: Pre-op. Preanesthetic checklist: patient identified, IV checked, site marked, risks and benefits discussed, surgical consent, monitors and equipment checked, pre-op evaluation, timeout performed and anesthesia consent Position: supine Landmarks identified PA cath was placed.Swan type and PA catheter depth:thermodilation and 40PA Cath depth:40 Procedure performed using ultrasound guided technique. Attempts: 1 Patient tolerated the procedure well with no immediate complications.    Procedure Name: Intubation Date/Time: 10/02/2015 7:55 AM Performed by: Jacquiline Doe A Pre-anesthesia Checklist: Patient identified, Timeout performed, Emergency Drugs available, Suction available and Patient being monitored Patient Re-evaluated:Patient Re-evaluated prior to inductionOxygen Delivery Method: Circle system utilized Preoxygenation: Pre-oxygenation with 100% oxygen Intubation Type: IV induction and Cricoid Pressure applied Ventilation: Mask ventilation without difficulty Laryngoscope Size: Mac and 4 Grade View: Grade I Tube type:  Oral Tube size: 8.0 mm Number of attempts: 1 Airway Equipment and Method: Stylet Placement Confirmation: ETT inserted through vocal cords under direct vision,  breath sounds checked- equal and bilateral and positive ETCO2 Secured at: 23 cm Tube secured with: Tape Dental Injury: Teeth and Oropharynx as per pre-operative assessment

## 2015-10-02 NOTE — Transfer of Care (Signed)
Immediate Anesthesia Transfer of Care Note  Patient: Fernando Hess  Procedure(s) Performed: Procedure(s): CORONARY ARTERY BYPASS GRAFTING (CABG) X5 LIMA-LAD; SVG-DIAG; SVG-OM; SVG-PD; SVG-RAMUS TRANSESOPHAGEAL ECHOCARDIOGRAM (TEE) ENDOSCOPIC GREATER SAPHENOUS VEIN HARVEST BILAT LE (N/A) TRANSESOPHAGEAL ECHOCARDIOGRAM (TEE) (N/A)  Patient Location: SICU  Anesthesia Type:General  Level of Consciousness: Patient remains intubated per anesthesia plan  Airway & Oxygen Therapy: Patient remains intubated per anesthesia plan and Patient placed on Ventilator (see vital sign flow sheet for setting)  Post-op Assessment: Report given to RN and Post -op Vital signs reviewed and stable  Post vital signs: Reviewed and stable  Last Vitals:  Filed Vitals:   10/02/15 0532 10/02/15 1408  BP: 135/68 113/55  Pulse: 110 103  Temp: 37 C   Resp: 16 16    Complications: No apparent anesthesia complications

## 2015-10-02 NOTE — Addendum Note (Signed)
Addendum  created 10/02/15 1622 by Lillia Abed, MD   Modules edited: Clinical Notes, Notes Section   Clinical Notes:  File: [id number]   Notes Section:  Delete: [id number]

## 2015-10-03 ENCOUNTER — Inpatient Hospital Stay (HOSPITAL_COMMUNITY): Payer: Medicare Other

## 2015-10-03 ENCOUNTER — Encounter (HOSPITAL_COMMUNITY): Payer: Self-pay | Admitting: Cardiothoracic Surgery

## 2015-10-03 LAB — BASIC METABOLIC PANEL
Anion gap: 7 (ref 5–15)
BUN: 14 mg/dL (ref 6–20)
CHLORIDE: 109 mmol/L (ref 101–111)
CO2: 23 mmol/L (ref 22–32)
CREATININE: 1.17 mg/dL (ref 0.61–1.24)
Calcium: 8.3 mg/dL — ABNORMAL LOW (ref 8.9–10.3)
Glucose, Bld: 110 mg/dL — ABNORMAL HIGH (ref 65–99)
POTASSIUM: 4.2 mmol/L (ref 3.5–5.1)
SODIUM: 139 mmol/L (ref 135–145)

## 2015-10-03 LAB — CBC
HCT: 25.8 % — ABNORMAL LOW (ref 39.0–52.0)
HCT: 26.2 % — ABNORMAL LOW (ref 39.0–52.0)
HEMOGLOBIN: 8.8 g/dL — AB (ref 13.0–17.0)
Hemoglobin: 8.9 g/dL — ABNORMAL LOW (ref 13.0–17.0)
MCH: 28.3 pg (ref 26.0–34.0)
MCH: 28 pg (ref 26.0–34.0)
MCHC: 34.1 g/dL (ref 30.0–36.0)
MCHC: 34 g/dL (ref 30.0–36.0)
MCV: 83 fL (ref 78.0–100.0)
MCV: 82.4 fL (ref 78.0–100.0)
PLATELETS: 121 10*3/uL — AB (ref 150–400)
Platelets: 128 10*3/uL — ABNORMAL LOW (ref 150–400)
RBC: 3.11 MIL/uL — AB (ref 4.22–5.81)
RBC: 3.18 MIL/uL — AB (ref 4.22–5.81)
RDW: 18.7 % — ABNORMAL HIGH (ref 11.5–15.5)
RDW: 19 % — ABNORMAL HIGH (ref 11.5–15.5)
WBC: 9.4 10*3/uL (ref 4.0–10.5)
WBC: 10.5 10*3/uL (ref 4.0–10.5)

## 2015-10-03 LAB — MAGNESIUM
MAGNESIUM: 2.2 mg/dL (ref 1.7–2.4)
Magnesium: 2 mg/dL (ref 1.7–2.4)

## 2015-10-03 LAB — POCT I-STAT 3, ART BLOOD GAS (G3+)
ACID-BASE DEFICIT: 4 mmol/L — AB (ref 0.0–2.0)
ACID-BASE DEFICIT: 2 mmol/L (ref 0.0–2.0)
Acid-base deficit: 2 mmol/L (ref 0.0–2.0)
BICARBONATE: 21.5 meq/L (ref 20.0–24.0)
BICARBONATE: 21.8 meq/L (ref 20.0–24.0)
BICARBONATE: 24.3 meq/L — AB (ref 20.0–24.0)
Bicarbonate: 22 mEq/L (ref 20.0–24.0)
O2 SAT: 98 %
O2 SAT: 92 %
O2 Saturation: 95 %
O2 Saturation: 98 %
PCO2 ART: 33.9 mmHg — AB (ref 35.0–45.0)
PCO2 ART: 34.7 mmHg — AB (ref 35.0–45.0)
PH ART: 7.406 (ref 7.350–7.450)
PO2 ART: 110 mmHg — AB (ref 80.0–100.0)
PO2 ART: 113 mmHg — AB (ref 80.0–100.0)
Patient temperature: 37.3
Patient temperature: 37.3
TCO2: 23 mmol/L (ref 0–100)
TCO2: 23 mmol/L (ref 0–100)
TCO2: 23 mmol/L (ref 0–100)
TCO2: 25 mmol/L (ref 0–100)
pCO2 arterial: 39.3 mmHg (ref 35.0–45.0)
pCO2 arterial: 36.4 mmHg (ref 35.0–45.0)
pH, Arterial: 7.347 — ABNORMAL LOW (ref 7.350–7.450)
pH, Arterial: 7.421 (ref 7.350–7.450)
pH, Arterial: 7.434 (ref 7.350–7.450)
pO2, Arterial: 62 mmHg — ABNORMAL LOW (ref 80.0–100.0)
pO2, Arterial: 72 mmHg — ABNORMAL LOW (ref 80.0–100.0)

## 2015-10-03 LAB — POCT I-STAT, CHEM 8
BUN: 16 mg/dL (ref 6–20)
CALCIUM ION: 1.16 mmol/L (ref 1.13–1.30)
CHLORIDE: 105 mmol/L (ref 101–111)
CREATININE: 1.2 mg/dL (ref 0.61–1.24)
GLUCOSE: 250 mg/dL — AB (ref 65–99)
HCT: 25 % — ABNORMAL LOW (ref 39.0–52.0)
Hemoglobin: 8.5 g/dL — ABNORMAL LOW (ref 13.0–17.0)
Potassium: 4 mmol/L (ref 3.5–5.1)
Sodium: 135 mmol/L (ref 135–145)
TCO2: 21 mmol/L (ref 0–100)

## 2015-10-03 LAB — GLUCOSE, CAPILLARY
GLUCOSE-CAPILLARY: 164 mg/dL — AB (ref 65–99)
GLUCOSE-CAPILLARY: 166 mg/dL — AB (ref 65–99)
GLUCOSE-CAPILLARY: 118 mg/dL — AB (ref 65–99)
GLUCOSE-CAPILLARY: 110 mg/dL — AB (ref 65–99)
GLUCOSE-CAPILLARY: 104 mg/dL — AB (ref 65–99)
GLUCOSE-CAPILLARY: 102 mg/dL — AB (ref 65–99)
GLUCOSE-CAPILLARY: 126 mg/dL — AB (ref 65–99)
GLUCOSE-CAPILLARY: 140 mg/dL — AB (ref 65–99)
GLUCOSE-CAPILLARY: 128 mg/dL — AB (ref 65–99)
GLUCOSE-CAPILLARY: 124 mg/dL — AB (ref 65–99)
Glucose-Capillary: 109 mg/dL — ABNORMAL HIGH (ref 65–99)
Glucose-Capillary: 109 mg/dL — ABNORMAL HIGH (ref 65–99)
Glucose-Capillary: 118 mg/dL — ABNORMAL HIGH (ref 65–99)
Glucose-Capillary: 130 mg/dL — ABNORMAL HIGH (ref 65–99)
Glucose-Capillary: 135 mg/dL — ABNORMAL HIGH (ref 65–99)
Glucose-Capillary: 143 mg/dL — ABNORMAL HIGH (ref 65–99)
Glucose-Capillary: 176 mg/dL — ABNORMAL HIGH (ref 65–99)
Glucose-Capillary: 195 mg/dL — ABNORMAL HIGH (ref 65–99)
Glucose-Capillary: 215 mg/dL — ABNORMAL HIGH (ref 65–99)
Glucose-Capillary: 96 mg/dL (ref 65–99)
Glucose-Capillary: 98 mg/dL (ref 65–99)
Glucose-Capillary: 98 mg/dL (ref 65–99)

## 2015-10-03 LAB — PREPARE FRESH FROZEN PLASMA
Unit division: 0
Unit division: 0

## 2015-10-03 LAB — CREATININE, SERUM
Creatinine, Ser: 1.4 mg/dL — ABNORMAL HIGH (ref 0.61–1.24)
GFR, EST AFRICAN AMERICAN: 57 mL/min — AB (ref >60)
GFR, EST NON AFRICAN AMERICAN: 49 mL/min — AB (ref >60)

## 2015-10-03 MED ORDER — FUROSEMIDE 10 MG/ML IJ SOLN
20.0000 mg | Freq: Two times a day (BID) | INTRAMUSCULAR | Status: AC
  Administered 2015-10-03 – 2015-10-04 (×3): 20 mg via INTRAVENOUS
  Filled 2015-10-03 (×3): qty 2

## 2015-10-03 MED ORDER — DEXTROSE 50 % IV SOLN
1.0000 | Freq: Once | INTRAVENOUS | Status: AC
  Administered 2015-10-03: 50 mL via INTRAVENOUS

## 2015-10-03 MED ORDER — INSULIN ASPART 100 UNIT/ML ~~LOC~~ SOLN
0.0000 [IU] | SUBCUTANEOUS | Status: DC
  Administered 2015-10-03: 2 [IU] via SUBCUTANEOUS
  Administered 2015-10-03: 8 [IU] via SUBCUTANEOUS
  Administered 2015-10-04: 2 [IU] via SUBCUTANEOUS

## 2015-10-03 MED ORDER — INSULIN DETEMIR 100 UNIT/ML ~~LOC~~ SOLN
20.0000 [IU] | Freq: Two times a day (BID) | SUBCUTANEOUS | Status: DC
  Filled 2015-10-03 (×3): qty 0.2

## 2015-10-03 MED ORDER — DEXTROSE 50 % IV SOLN
INTRAVENOUS | Status: AC
  Filled 2015-10-03: qty 50

## 2015-10-03 MED ORDER — INSULIN ASPART 100 UNIT/ML ~~LOC~~ SOLN
3.0000 [IU] | Freq: Three times a day (TID) | SUBCUTANEOUS | Status: DC
  Administered 2015-10-03 – 2015-10-04 (×4): 3 [IU] via SUBCUTANEOUS

## 2015-10-03 MED ORDER — INSULIN DETEMIR 100 UNIT/ML ~~LOC~~ SOLN
14.0000 [IU] | Freq: Two times a day (BID) | SUBCUTANEOUS | Status: DC
  Administered 2015-10-03: 14 [IU] via SUBCUTANEOUS
  Filled 2015-10-03 (×2): qty 0.14

## 2015-10-03 MED FILL — Heparin Sodium (Porcine) Inj 1000 Unit/ML: INTRAMUSCULAR | Qty: 2500 | Status: AC

## 2015-10-03 NOTE — Progress Notes (Signed)
Dr. Prescott Gum made aware of patients recent CBG of 250, 215, 195; placed new orders for levemir.  Fernando Hess M

## 2015-10-03 NOTE — Procedures (Signed)
Extubation Procedure Note  Patient Details:   Name: Fernando Hess DOB: 02/01/1946 MRN: [id number]   Airway Documentation:     Evaluation  O2 sats: stable throughout Complications: No apparent complications Patient did tolerate procedure well. Bilateral Breath Sounds: Clear   Yes   NIF: -16 cmH2O  VC: 0.8 L/min Extubated to 6L [place]  [name] 10/03/2015, 12:14 AM

## 2015-10-03 NOTE — Progress Notes (Signed)
1 Day Post-Op Procedure(s) (LRB): CORONARY ARTERY BYPASS GRAFTING (CABG) X5 LIMA-LAD; SVG-DIAG; SVG-OM; SVG-PD; SVG-RAMUS TRANSESOPHAGEAL ECHOCARDIOGRAM (TEE) ENDOSCOPIC GREATER SAPHENOUS VEIN HARVEST BILAT LE (N/A) TRANSESOPHAGEAL ECHOCARDIOGRAM (TEE) (N/A) Subjective: Doing well after CABG patient extubated with adequate oxygenation on nasal cannula We'll move to debris lines and mobilize out of bed to chair Start twice a day Lantus insulin and sliding scale and transition from IV insulin for glucose controde Patient with some postop pain and neuro intact Patient started on IV amiodarone for postop A. fib-currently sinus  Objective: Vital signs in last 24 hours: Temp:  [96.6 F (35.9 C)-99.3 F (37.4 C)] 99 F (37.2 C) (01/18 0700) Pulse Rate:  [75-104] 89 (01/18 0700) Cardiac Rhythm:  [-] Normal sinus rhythm (01/18 0823) Resp:  [11-37] 19 (01/18 0700) BP: (84-118)/(51-72) 104/61 mmHg (01/18 0600) SpO2:  [92 %-100 %] 96 % (01/18 0804) Arterial Line BP: (89-152)/(42-78) 122/48 mmHg (01/18 0700) FiO2 (%):  [40 %-70 %] 40 % (01/17 2323) Weight:  [159 lb 2.8 oz (72.2 kg)] 159 lb 2.8 oz (72.2 kg) (01/18 0243)  Hemodynamic parameters for last 24 hours: PAP: (24-49)/(13-29) 42/16 mmHg CO:  [3.3 L/min-8 L/min] 7.2 L/min CI:  [1.8 L/min/m2-4.4 L/min/m2] 4 L/min/m2  Intake/Output from previous day: 01/17 0701 - 01/18 0700 In: 7687.8 [I.V.:5465.8; Blood:792; NG/GT:30; IV Piggyback:1400] Out: [id number] [Urine:3230; Emesis/NG output:150; Blood:650; Chest Tube:400] Intake/Output this shift:    Alert and comfortable Breath sounds diminished but clear Extremities warm with minimal edema Sinus rhythm on IV amiodarone  Lab Results:  Recent Labs  10/02/15 1930 10/02/15 1933 10/03/15 0400  WBC 8.8  --  9.4  HGB 9.6* 9.5* 8.8*  HCT 28.8* 28.0* 25.8*  PLT 124*  --  121*   BMET:  Recent Labs  10/02/15 0418  10/02/15 1933 10/03/15 0400  NA 140  < > 139 139  K 4.7  < > 4.0 4.2  CL  104  < > 108 109  CO2 26  --   --  23  GLUCOSE 224*  < > 115* 110*  BUN 19  < > 12 14  CREATININE 1.32*  < > 0.90 1.17  CALCIUM 9.4  --   --  8.3*  < > = values in this interval not displayed.  PT/INR:  Recent Labs  10/02/15 1415  LABPROT 18.2*  INR 1.50*   ABG    Component Value Date/Time   PHART 7.434 10/03/2015 0746   HCO3 24.3* 10/03/2015 0746   TCO2 25 10/03/2015 0746   ACIDBASEDEF 2.0 10/03/2015 0456   O2SAT 95.0 10/03/2015 0746   CBG (last 3)   Recent Labs  10/02/15 2203 10/02/15 2300 10/03/15  GLUCAP 166* 143* 130*    Assessment/Plan: S/P Procedure(s) (LRB): CORONARY ARTERY BYPASS GRAFTING (CABG) X5 LIMA-LAD; SVG-DIAG; SVG-OM; SVG-PD; SVG-RAMUS TRANSESOPHAGEAL ECHOCARDIOGRAM (TEE) ENDOSCOPIC GREATER SAPHENOUS VEIN HARVEST BILAT LE (N/A) TRANSESOPHAGEAL ECHOCARDIOGRAM (TEE) (N/A) Mobilize Diuresis Diabetes control See progression orders Continue IV amiodarone today and transition to by mouth dosing tomorrow   LOS: 9 days    Tharon Aquas Trigt III 10/03/2015

## 2015-10-03 NOTE — Progress Notes (Signed)
CT surgery p.m. Rounds  Patient has stable day and was ambulating Blood sugars have increased and insulin dosing has been adjusted P.m. laboratories reviewed and are satisfactory except for elevated blood sugar Maintaining sinus rhythm and good oxygen saturation

## 2015-10-04 ENCOUNTER — Inpatient Hospital Stay (HOSPITAL_COMMUNITY): Payer: Medicare Other

## 2015-10-04 LAB — BASIC METABOLIC PANEL
Anion gap: 10 (ref 5–15)
BUN: 17 mg/dL (ref 6–20)
CO2: 23 mmol/L (ref 22–32)
Calcium: 8.5 mg/dL — ABNORMAL LOW (ref 8.9–10.3)
Chloride: 104 mmol/L (ref 101–111)
Creatinine, Ser: 1.32 mg/dL — ABNORMAL HIGH (ref 0.61–1.24)
GFR calc Af Amer: >60 mL/min (ref >60)
GFR calc non Af Amer: 53 mL/min — ABNORMAL LOW (ref >60)
Glucose, Bld: 132 mg/dL — ABNORMAL HIGH (ref 65–99)
Potassium: 4.1 mmol/L (ref 3.5–5.1)
Sodium: 137 mmol/L (ref 135–145)

## 2015-10-04 LAB — GLUCOSE, CAPILLARY
GLUCOSE-CAPILLARY: 112 mg/dL — AB (ref 65–99)
GLUCOSE-CAPILLARY: 148 mg/dL — AB (ref 65–99)
GLUCOSE-CAPILLARY: 160 mg/dL — AB (ref 65–99)
GLUCOSE-CAPILLARY: 81 mg/dL (ref 65–99)
Glucose-Capillary: 116 mg/dL — ABNORMAL HIGH (ref 65–99)
Glucose-Capillary: 150 mg/dL — ABNORMAL HIGH (ref 65–99)
Glucose-Capillary: 159 mg/dL — ABNORMAL HIGH (ref 65–99)
Glucose-Capillary: 62 mg/dL — ABNORMAL LOW (ref 65–99)

## 2015-10-04 LAB — CBC
HCT: 27.2 % — ABNORMAL LOW (ref 39.0–52.0)
Hemoglobin: 8.8 g/dL — ABNORMAL LOW (ref 13.0–17.0)
MCH: 26.7 pg (ref 26.0–34.0)
MCHC: 32.4 g/dL (ref 30.0–36.0)
MCV: 82.7 fL (ref 78.0–100.0)
Platelets: 127 10*3/uL — ABNORMAL LOW (ref 150–400)
RBC: 3.29 MIL/uL — ABNORMAL LOW (ref 4.22–5.81)
RDW: 19.3 % — ABNORMAL HIGH (ref 11.5–15.5)
WBC: 9.8 10*3/uL (ref 4.0–10.5)

## 2015-10-04 MED ORDER — SODIUM CHLORIDE 0.9 % IV SOLN: 250.0000 mL | INTRAVENOUS | Status: DC | PRN

## 2015-10-04 MED ORDER — ALUM & MAG HYDROXIDE-SIMETH 200-200-20 MG/5ML PO SUSP: 15.0000 mL | ORAL | Status: DC | PRN

## 2015-10-04 MED ORDER — DM-GUAIFENESIN ER 30-600 MG PO TB12: 1.0000 | ORAL_TABLET | Freq: Two times a day (BID) | ORAL | Status: DC | PRN

## 2015-10-04 MED ORDER — INSULIN ASPART 100 UNIT/ML ~~LOC~~ SOLN
0.0000 [IU] | Freq: Three times a day (TID) | SUBCUTANEOUS | Status: DC
  Administered 2015-10-04 (×2): 2 [IU] via SUBCUTANEOUS
  Administered 2015-10-05: 4 [IU] via SUBCUTANEOUS
  Administered 2015-10-06: 2 [IU] via SUBCUTANEOUS
  Administered 2015-10-06: 12 [IU] via SUBCUTANEOUS
  Administered 2015-10-07 – 2015-10-08 (×3): 4 [IU] via SUBCUTANEOUS

## 2015-10-04 MED ORDER — SODIUM CHLORIDE 0.9 % IJ SOLN
3.0000 mL | Freq: Two times a day (BID) | INTRAMUSCULAR | Status: DC
  Administered 2015-10-04 – 2015-10-07 (×5): 3 mL via INTRAVENOUS

## 2015-10-04 MED ORDER — MAGNESIUM HYDROXIDE 400 MG/5ML PO SUSP: 30.0000 mL | Freq: Every day | ORAL | Status: DC | PRN

## 2015-10-04 MED ORDER — POTASSIUM CHLORIDE CRYS ER 20 MEQ PO TBCR
20.0000 meq | EXTENDED_RELEASE_TABLET | Freq: Every day | ORAL | Status: DC
  Administered 2015-10-05 – 2015-10-08 (×4): 20 meq via ORAL
  Filled 2015-10-04 (×4): qty 1

## 2015-10-04 MED ORDER — POTASSIUM CHLORIDE 10 MEQ/50ML IV SOLN
10.0000 meq | INTRAVENOUS | Status: AC
  Administered 2015-10-04 (×2): 10 meq via INTRAVENOUS
  Filled 2015-10-04 (×2): qty 50

## 2015-10-04 MED ORDER — MOVING RIGHT ALONG BOOK
Freq: Once | Status: AC
  Administered 2015-10-04: 09:00:00
  Filled 2015-10-04: qty 1

## 2015-10-04 MED ORDER — SODIUM CHLORIDE 0.9 % IJ SOLN: 3.0000 mL | INTRAMUSCULAR | Status: DC | PRN

## 2015-10-04 MED ORDER — LEVALBUTEROL HCL 1.25 MG/0.5ML IN NEBU: 1.2500 mg | INHALATION_SOLUTION | Freq: Four times a day (QID) | RESPIRATORY_TRACT | Status: DC | PRN

## 2015-10-04 MED ORDER — AMIODARONE HCL 200 MG PO TABS
200.0000 mg | ORAL_TABLET | Freq: Two times a day (BID) | ORAL | Status: DC
  Administered 2015-10-04 – 2015-10-08 (×9): 200 mg via ORAL
  Filled 2015-10-04 (×9): qty 1

## 2015-10-04 MED ORDER — INSULIN DETEMIR 100 UNIT/ML ~~LOC~~ SOLN
15.0000 [IU] | Freq: Two times a day (BID) | SUBCUTANEOUS | Status: DC
  Administered 2015-10-04 – 2015-10-05 (×4): 15 [IU] via SUBCUTANEOUS
  Filled 2015-10-04 (×6): qty 0.15

## 2015-10-04 NOTE — Progress Notes (Signed)
Pt tx from 2S to 2W22, belongings at bedside, family at bedside, receiving RN and NT in room with patient, patient placed on tele, SCDs at bedside, no further questions from patient or RN.  Rowe Pavy, RN

## 2015-10-04 NOTE — Progress Notes (Signed)
Pt transferred to 2w from 2s. Pt placed on tele. Vitals stable. Pt oriented to room, call bell and phone within reach. Family at bedside, will continue to monitor.

## 2015-10-04 NOTE — Progress Notes (Signed)
2 Days Post-Op Procedure(s) (LRB): CORONARY ARTERY BYPASS GRAFTING (CABG) X5 LIMA-LAD; SVG-DIAG; SVG-OM; SVG-PD; SVG-RAMUS TRANSESOPHAGEAL ECHOCARDIOGRAM (TEE) ENDOSCOPIC GREATER SAPHENOUS VEIN HARVEST BILAT LE (N/A) TRANSESOPHAGEAL ECHOCARDIOGRAM (TEE) (N/A) Subjective: OOB , NSR,pulmonary status stable  Objective: Vital signs in last 24 hours: Temp:  [98.5 F (36.9 C)-98.8 F (37.1 C)] 98.8 F (37.1 C) (01/19 0400) Pulse Rate:  [69-98] 75 (01/19 0700) Cardiac Rhythm:  [-] Normal sinus rhythm (01/19 0730) Resp:  [16-25] 22 (01/19 0700) BP: (101-140)/(54-97) 134/95 mmHg (01/19 0700) SpO2:  [91 %-97 %] 97 % (01/19 0819) Arterial Line BP: (56-138)/(46-60) 120/53 mmHg (01/18 1600) Weight:  [173 lb 11.6 oz (78.8 kg)] 173 lb 11.6 oz (78.8 kg) (01/19 0630)  Hemodynamic parameters for last 24 hours:   stable  Intake/Output from previous day: 01/18 0701 - 01/19 0700 In: 3049.8 [I.V.:3049.8] Out: 1265 [Urine:1225; Chest Tube:40] Intake/Output this shift:    wet cough abd soft extrem warm  Lab Results:  Recent Labs  10/03/15 1545 10/03/15 1552 10/04/15 0459  WBC 10.5  --  9.8  HGB 8.9* 8.5* 8.8*  HCT 26.2* 25.0* 27.2*  PLT 128*  --  127*   BMET:  Recent Labs  10/03/15 0400  10/03/15 1552 10/04/15 0459  NA 139  --  135 137  K 4.2  --  4.0 4.1  CL 109  --  105 104  CO2 23  --   --  23  GLUCOSE 110*  --  250* 132*  BUN 14  --  16 17  CREATININE 1.17  < > 1.20 1.32*  CALCIUM 8.3*  --   --  8.5*  < > = values in this interval not displayed.  PT/INR:  Recent Labs  10/02/15 1415  LABPROT 18.2*  INR 1.50*   ABG    Component Value Date/Time   PHART 7.434 10/03/2015 0746   HCO3 24.3* 10/03/2015 0746   TCO2 21 10/03/2015 1552   ACIDBASEDEF 2.0 10/03/2015 0456   O2SAT 95.0 10/03/2015 0746   CBG (last 3)   Recent Labs  10/03/15 2352 10/04/15 0017 10/04/15 0416  GLUCAP 62* 160* 116*    Assessment/Plan: S/P Procedure(s) (LRB): CORONARY ARTERY BYPASS  GRAFTING (CABG) X5 LIMA-LAD; SVG-DIAG; SVG-OM; SVG-PD; SVG-RAMUS TRANSESOPHAGEAL ECHOCARDIOGRAM (TEE) ENDOSCOPIC GREATER SAPHENOUS VEIN HARVEST BILAT LE (N/A) TRANSESOPHAGEAL ECHOCARDIOGRAM (TEE) (N/A) Mobilize Diuresis Diabetes control Plan for transfer to step-down: see transfer orders   LOS: 10 days    Fernando Hess 10/04/2015

## 2015-10-05 ENCOUNTER — Inpatient Hospital Stay (HOSPITAL_COMMUNITY): Payer: Medicare Other

## 2015-10-05 LAB — GLUCOSE, CAPILLARY
GLUCOSE-CAPILLARY: 83 mg/dL (ref 65–99)
Glucose-Capillary: 90 mg/dL (ref 65–99)
Glucose-Capillary: 136 mg/dL — ABNORMAL HIGH (ref 65–99)
Glucose-Capillary: 200 mg/dL — ABNORMAL HIGH (ref 65–99)

## 2015-10-05 LAB — BASIC METABOLIC PANEL
Anion gap: 10 (ref 5–15)
BUN: 21 mg/dL — ABNORMAL HIGH (ref 6–20)
CO2: 24 mmol/L (ref 22–32)
Calcium: 8.6 mg/dL — ABNORMAL LOW (ref 8.9–10.3)
Chloride: 103 mmol/L (ref 101–111)
Creatinine, Ser: 1.2 mg/dL (ref 0.61–1.24)
GFR calc Af Amer: >60 mL/min (ref >60)
GFR calc non Af Amer: 59 mL/min — ABNORMAL LOW (ref >60)
Glucose, Bld: 104 mg/dL — ABNORMAL HIGH (ref 65–99)
Potassium: 4.1 mmol/L (ref 3.5–5.1)
Sodium: 137 mmol/L (ref 135–145)

## 2015-10-05 LAB — CBC
HCT: 29.7 % — ABNORMAL LOW (ref 39.0–52.0)
Hemoglobin: 9.6 g/dL — ABNORMAL LOW (ref 13.0–17.0)
MCH: 27.2 pg (ref 26.0–34.0)
MCHC: 32.3 g/dL (ref 30.0–36.0)
MCV: 84.1 fL (ref 78.0–100.0)
Platelets: 141 10*3/uL — ABNORMAL LOW (ref 150–400)
RBC: 3.53 MIL/uL — ABNORMAL LOW (ref 4.22–5.81)
RDW: 18.9 % — ABNORMAL HIGH (ref 11.5–15.5)
WBC: 11.6 10*3/uL — ABNORMAL HIGH (ref 4.0–10.5)

## 2015-10-05 LAB — TYPE AND SCREEN
ABO/RH(D): O POS
Antibody Screen: NEGATIVE
Unit division: 0
Unit division: 0
Unit division: 0
Unit division: 0

## 2015-10-05 MED ORDER — FUROSEMIDE 10 MG/ML IJ SOLN
40.0000 mg | Freq: Once | INTRAMUSCULAR | Status: AC
  Administered 2015-10-05: 40 mg via INTRAVENOUS
  Filled 2015-10-05: qty 4

## 2015-10-05 NOTE — Progress Notes (Signed)
CARDIAC REHAB PHASE I   PRE:  Rate/Rhythm: 72 SR    BP: sitting 103/67    SaO2: 97 1 1/2L, 94 RA  MODE:  Ambulation: 450 ft   POST:  Rate/Rhythm: 98 SR    BP: sitting 121/73     SaO2: 93 RA  Pt able to stand and walk fairly independently, just very slow. Increased distance, no major c/o. Sts he feels he is not doing well. D/c'd O2. Encouraged IS and x2 more walks. Pt planning on CRPII again and will send referral to [facility]. [id number]   Darrick Meigs CES, ACSM 10/05/2015 10:03 AM

## 2015-10-05 NOTE — Progress Notes (Signed)
Utilization review completed.  

## 2015-10-05 NOTE — Discharge Summary (Signed)
Physician Discharge Summary  Patient ID: Fernando Hess MRN: [id number] DOB/AGE: Sep 27, 1945 70 y.o.  Admit date: 09/24/2015 Discharge date: 10/08/2015  Admission Diagnoses:  Patient Active Problem List   Diagnosis Date Noted  . Elevated troponin   . CAD (coronary artery disease), native coronary artery 09/24/2015  . Acute renal failure (ARF) ([place]) 09/24/2015  . Non-STEMI (non-ST elevated myocardial infarction) ([place]) 09/24/2015  . Diabetes mellitus type 2, noninsulin dependent ([place]) 03/01/2008  . Hyperlipdiemia   . Hypertension   . History of kidney stones    Discharge Diagnoses:   Patient Active Problem List   Diagnosis Date Noted  . 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 Elevated troponin   . CAD (coronary artery disease), native coronary artery 09/24/2015  . Acute renal failure (ARF) ([place]) 09/24/2015  . Non-STEMI (non-ST elevated myocardial infarction) ([place]) 09/24/2015  . Diabetes mellitus type 2, noninsulin dependent ([place]) 03/01/2008  . Hyperlipdiemia   . Hypertension   . History of kidney stones    Discharged Condition: good  History of Present Illness:  Fernando Hess is a 70 yo white male with known history of diabetes, HTN, Hyperlipidemia, and CAD.  In July of 2015 the patient had an abnormal treadmill test.  After the case was completed the patient vomited and developed faintness and throat tightness with exertion.  Catheterization at that time showed a small diabetic vessels with moderate disease.  At that time it was felt there was no role for intervention and he was treated medically.  The patient has continued to have mild throat tightness since that time and his medications were increased accordingly.  The patient has been routinely followed and had remained symptom free.  However, in November the patient drove to [place] at which time he developed bilateral jaw pain and headache.  These symptoms have continued to occur intermediately.  Over the weekend of 09/21/2015 the patient  developed an increase in his symptoms and a severe episode on 09/24/2015.  He presented to his PCP at which time we experiencing neck pain and dizziness.  EKG was obtained which did not show evidence of ST depression.  However, he was referred to the ED for further evaluation.  Work up in the ED showed ST changes on his EKG and positive cardiac enzymes.  He was started on Heparin and admitted to the hospital for further care.   Hospital Course:   The patient remained chest pain free during hospitalization.  He was taken for cardiac catheterization on 09/26/2015 which showed severe multivessel CAD with a mildly reduced EF of 45%.  It was felt coronary bypass grafting would be indicated and TCTS was consulted.  He was evaluated by Dr. Prescott Gum on 09/27/2015 who was in agreement the patient would require Coronary Bypass Grafting procedure.  The risks and benefits of the procedure were explained to the patient and he was agreeable to proceed.  The patient was taken to the operating room on 10/02/2015.  He underwent CABG x 5 utilizing LIMA to LAD, SVG to Diagonal, SVG to OM, SVG to PD, and SVG to Ramus.  He also underwent Endoscopic harvest of the greater saphenous vein from the right and left leg.  He tolerated the procedure without difficulty and was taken to the SICU in stable condition.  During his stay in the SICU the patient was extubated.  He was weaned off Dopamine and Milrinone as tolerated.  He developed post operative Atrial Fibrillation and was subsequently treated with IV Amiodarone with successful  conversion to NSR.  His chest tubes and arterial lines were removed without difficulty.  His blood sugars were elevated and his insulin was adjusted accordingly.  He was felt medically stable for transfer to the step down unit on POD #2.  The patient continues to progress.  He is maintaining NSR and his Lopressor has been increased as tolerated.  His creatinine has stabilized and he has been restarted on his home  diabetic medications.  He is hypervolemic and responded well to Lasix.  He continues to maintain NSR and his pacing wires have been removed without difficulty.  He is ambulating independently.  He is medically stable at this time and felt medically stable for discharge home today.             Significant Diagnostic Studies: angiography:    Ramus lesion, 90% stenosed.  Prox Cx to Mid Cx lesion, 75% stenosed.  RPDA lesion, 40% stenosed.  1st Diag lesion, 75% stenosed.  Mid LAD lesion, 50% stenosed.  Dist LAD-1 lesion, 80% stenosed.  Dist LAD-2 lesion, 85% stenosed.  1st Mrg lesion, 40% stenosed.  The left ventricular systolic function is normal.  Mild LV dysfunction with an ejection fraction of 45-50% and evidence for mild mid anterolateral hypocontractility.  Treatments: surgery:   1. Coronary artery bypass grafting x5 (left internal mammary artery to left anterior descending coronary artery,           saphenous vein graft to diagonal, saphenous vein graft to ramus intermediate, saphenous vein graft to circumflex marginal, saphenous vein graft to posterior descending). 2. Endoscopic harvest of bilateral greater saphenous vein.  Disposition: 01-Home or Self Care   Discharge medications:  The patient has been discharged on:   1.Beta Blocker:  Yes [ x  ]                              No   [   ]                              If No, reason:  2.Ace Inhibitor/ARB: Yes [ x  ]                                     No  [ x   ]                                     If No, reason:  3.Statin:   Yes [ x  ]                  No  [   ]                  If No, reason:  4.Ecasa:  Yes  [ x  ]                  No   [   ]                  If No, reason:  Discharge Instructions    Amb Referral to Cardiac Rehabilitation    Complete by:  As directed   Diagnosis:  CABG  Follow-up Information    Follow up with Ivin Poot III, MD On 10/31/2015.   Specialty:  Cardiothoracic  Surgery   Why:  Appointment is at 3:00   Contact information:   [place] [address] [phone]       Follow up with [facility] On 10/31/2015.   Why:  Please get CXR at 2:30, located on first floor of [facility] information:   [facility]       Follow up with Ezzard Standing, MD.   Specialty:  Cardiology   Why:  contact office to set up follow up appointment for 2 weeks   Contact information:   [address] [place] [address] [phone]       Signed: Odis Luster 10/08/2015, 7:57 AM

## 2015-10-05 NOTE — Progress Notes (Signed)
      [address]       [place] [address]             [phone]      3 Days Post-Op Procedure(s) (LRB): CORONARY ARTERY BYPASS GRAFTING (CABG) X5 LIMA-LAD; SVG-DIAG; SVG-OM; SVG-PD; SVG-RAMUS TRANSESOPHAGEAL ECHOCARDIOGRAM (TEE) ENDOSCOPIC GREATER SAPHENOUS VEIN HARVEST BILAT LE (N/A) TRANSESOPHAGEAL ECHOCARDIOGRAM (TEE) (N/A)   Subjective:  [name] has no complaints this morning.  He states he has been doing well.  + ambulation  + BM  Objective: Vital signs in last 24 hours: Temp:  [98.7 F (37.1 C)-99.9 F (37.7 C)] 98.7 F (37.1 C) (01/20 0315) Pulse Rate:  [77-104] 82 (01/20 0315) Cardiac Rhythm:  [-] Normal sinus rhythm (01/20 0742) Resp:  [20-29] 20 (01/20 0315) BP: (90-155)/(54-83) 114/74 mmHg (01/20 0315) SpO2:  [90 %-98 %] 94 % (01/20 0315) Weight:  [175 lb 14.4 oz (79.788 kg)] 175 lb 14.4 oz (79.788 kg) (01/20 0315)  Intake/Output from previous day: 01/19 0701 - 01/20 0700 In: 136.7 [I.V.:36.7; IV Piggyback:100] Out: 1250 [Urine:1250]  General appearance: alert, cooperative and no distress Heart: regularly irregular rhythm Lungs: clear to auscultation bilaterally Abdomen: soft, non-tender; bowel sounds normal; no masses,  no organomegaly Extremities: edema 1-2+ pitting edema Wound: clean and dry  Lab Results:  Recent Labs  10/04/15 0459 10/05/15 0328  WBC 9.8 11.6*  HGB 8.8* 9.6*  HCT 27.2* 29.7*  PLT 127* 141*   BMET:  Recent Labs  10/04/15 0459 10/05/15 0328  NA 137 137  K 4.1 4.1  CL 104 103  CO2 23 24  GLUCOSE 132* 104*  BUN 17 21*  CREATININE 1.32* 1.20  CALCIUM 8.5* 8.6*    PT/INR:  Recent Labs  10/02/15 1415  LABPROT 18.2*  INR 1.50*   ABG    Component Value Date/Time   PHART 7.434 10/03/2015 0746   HCO3 24.3* 10/03/2015 0746   TCO2 21 10/03/2015 1552   ACIDBASEDEF 2.0 10/03/2015 0456   O2SAT 95.0 10/03/2015 0746   CBG (last 3)   Recent Labs  10/04/15 2131 10/04/15 2206 10/05/15 0615    GLUCAP 81 112* 90    Assessment/Plan: S/P Procedure(s) (LRB): CORONARY ARTERY BYPASS GRAFTING (CABG) X5 LIMA-LAD; SVG-DIAG; SVG-OM; SVG-PD; SVG-RAMUS TRANSESOPHAGEAL ECHOCARDIOGRAM (TEE) ENDOSCOPIC GREATER SAPHENOUS VEIN HARVEST BILAT LE (N/A) TRANSESOPHAGEAL ECHOCARDIOGRAM (TEE) (N/A)  1. CV- H/O A. Fib, currently NSR with irregular beat- continue Amiodarone, Lopressor  2. Pulm- wean oxygen as tolerated, continue IS 3. Renal- creatinine down to 1.20, remains hypervolemic, + pitting LE edema- will give IV Lasix today 4. DM- sugars controlled, continue current regimen will restart oral medications tomorrow if patient's creatinine remains stable 5. Dispo- patient stable, in NSR this morning however there are occasional irregular beats, will diurese today, remove EPW in AM if remains stable, possibly home sunday   LOS: 11 days    Ahmed Prima, Jazlyn Tippens 10/05/2015

## 2015-10-05 NOTE — Care Management Important Message (Signed)
Important Message  Patient Details  Name: Fernando Hess MRN: [id number] Date of Birth: [birth year]   Medicare Important Message Given:  Yes    Nathen May 10/05/2015, 2:59 PM

## 2015-10-05 NOTE — Discharge Instructions (Signed)
Coronary Artery Bypass Grafting, Care After °Refer to this sheet in the next few weeks. These instructions provide you with information on caring for yourself after your procedure. Your health care provider may also give you more specific instructions. Your treatment has been planned according to current medical practices, but problems sometimes occur. Call your health care provider if you have any problems or questions after your procedure. °WHAT TO EXPECT AFTER THE PROCEDURE °Recovery from surgery will be different for everyone. Some people feel well after 3 or 4 weeks, while for others it takes longer. After your procedure, it is typical to have the following: °· Nausea and a lack of appetite.   °· Constipation. °· Weakness and fatigue.   °· Depression or irritability.   °· Pain or discomfort at your incision site. °HOME CARE INSTRUCTIONS °· Take medicines only as directed by your health care provider. Do not stop taking medicines or start any new medicines without first checking with your health care provider. °· Take your pulse as directed by your health care provider. °· Perform deep breathing as directed by your health care provider. If you were given a device called an incentive spirometer, use it to practice deep breathing several times a day. Support your chest with a pillow or your arms when you take deep breaths or cough. °· Keep incision areas clean, dry, and protected. Remove or change any bandages (dressings) only as directed by your health care provider. You may have skin adhesive strips over the incision areas. Do not take the strips off. They will fall off on their own. °· Check incision areas daily for any swelling, redness, or drainage. °· If incisions were made in your legs, do the following: °¨ Avoid crossing your legs.   °¨ Avoid sitting for long periods of time. Change positions every 30 minutes.   °¨ Elevate your legs when you are sitting. °· Wear compression stockings as directed by your  health care provider. These stockings help keep blood clots from forming in your legs. °· Take showers once your health care provider approves. Until then, only take sponge baths. Pat incisions dry. Do not rub incisions with a washcloth or towel. Do not take baths, swim, or use a hot tub until your health care provider approves. °· Eat foods that are high in fiber, such as raw fruits and vegetables, whole grains, beans, and nuts. Meats should be lean cut. Avoid canned, processed, and fried foods. °· Drink enough fluid to keep your urine clear or pale yellow. °· Weigh yourself every day. This helps identify if you are retaining fluid that may make your heart and lungs work harder. °· Rest and limit activity as directed by your health care provider. You may be instructed to: °¨ Stop any activity at once if you have chest pain, shortness of breath, irregular heartbeats, or dizziness. Get help right away if you have any of these symptoms. °¨ Move around frequently for short periods or take short walks as directed by your health care provider. Increase your activities gradually. You may need physical therapy or cardiac rehabilitation to help strengthen your muscles and build your endurance. °¨ Avoid lifting, pushing, or pulling anything heavier than 10 lb (4.5 kg) for at least 6 weeks after surgery. °· Do not drive until your health care provider approves.  °· Ask your health care provider when you may return to work. °· Ask your health care provider when you may resume sexual activity. °· Keep all follow-up visits as directed by your health care   provider. This is important. °SEEK MEDICAL CARE IF: °· You have swelling, redness, increasing pain, or drainage at the site of an incision. °· You have a fever. °· You have swelling in your ankles or legs. °· You have pain in your legs.   °· You gain 2 or more pounds (0.9 kg) a day. °· You are nauseous or vomit. °· You have diarrhea.  °SEEK IMMEDIATE MEDICAL CARE IF: °· You have  chest pain that goes to your jaw or arms. °· You have shortness of breath.   °· You have a fast or irregular heartbeat.   °· You notice a "clicking" in your breastbone (sternum) when you move.   °· You have numbness or weakness in your arms or legs. °· You feel dizzy or light-headed.   °MAKE SURE YOU: °· Understand these instructions. °· Will watch your condition. °· Will get help right away if you are not doing well or get worse. °  °This information is not intended to replace advice given to you by your health care provider. Make sure you discuss any questions you have with your health care provider. °  °Document Released: 03/21/2005 Document Revised: 09/22/2014 Document Reviewed: 02/08/2013 °Elsevier Interactive Patient Education ©2016 [employer] ° °Endoscopic Saphenous Vein Harvesting, Care After °Refer to this sheet in the next few weeks. These instructions provide you with information on caring for yourself after your procedure. Your health care provider may also give you more specific instructions. Your treatment has been planned according to current medical practices, but problems sometimes occur. Call your health care provider if you have any problems or questions after your procedure. °HOME CARE INSTRUCTIONS °Medicine °· Take whatever pain medicine your surgeon prescribes. Follow the directions carefully. Do not take over-the-counter pain medicine unless your surgeon says it is okay. Some pain medicine can cause bleeding problems for several weeks after surgery. °· Follow your surgeon's instructions about driving. You will probably not be permitted to drive after heart surgery. °· Take any medicines your surgeon prescribes. Any medicines you took before your heart surgery should be checked with your health care provider before you start taking them again. °Wound care °· If your surgeon has prescribed an elastic bandage or stocking, ask how long you should wear it. °· Check the area around your surgical  cuts (incisions) whenever your bandages (dressings) are changed. Look for any redness or swelling. °· You will need to return to have the stitches (sutures) or staples taken out. Ask your surgeon when to do that. °· Ask your surgeon when you can shower or bathe. °Activity °· Try to keep your legs raised when you are sitting. °· Do any exercises your health care providers have given you. These may include deep breathing exercises, coughing, walking, or other exercises. °SEEK MEDICAL CARE IF: °· You have any questions about your medicines. °· You have more leg pain, especially if your pain medicine stops working. °· New or growing bruises develop on your leg. °· Your leg swells, feels tight, or becomes red. °· You have numbness in your leg. °SEEK IMMEDIATE MEDICAL CARE IF: °· Your pain gets much worse. °· Blood or fluid leaks from any of the incisions. °· Your incisions become warm, swollen, or red. °· You have chest pain. °· You have trouble breathing. °· You have a fever. °· You have more pain near your leg incision. °MAKE SURE YOU: °· Understand these instructions. °· Will watch your condition. °· Will get help right away if you are not doing well or   get worse. °  °This information is not intended to replace advice given to you by your health care provider. Make sure you discuss any questions you have with your health care provider. °  °Document Released: 05/14/2011 Document Revised: 09/22/2014 Document Reviewed: 05/14/2011 °Elsevier Interactive Patient Education ©2016 Else[employer] Inc. ° ° °

## 2015-10-06 LAB — GLUCOSE, CAPILLARY
GLUCOSE-CAPILLARY: 123 mg/dL — AB (ref 65–99)
Glucose-Capillary: 86 mg/dL (ref 65–99)
Glucose-Capillary: 284 mg/dL — ABNORMAL HIGH (ref 65–99)
Glucose-Capillary: 147 mg/dL — ABNORMAL HIGH (ref 65–99)

## 2015-10-06 MED ORDER — GLIMEPIRIDE 4 MG PO TABS
4.0000 mg | ORAL_TABLET | Freq: Every day | ORAL | Status: DC
  Administered 2015-10-07: 4 mg via ORAL
  Filled 2015-10-06: qty 1

## 2015-10-06 MED ORDER — ZOLPIDEM TARTRATE 5 MG PO TABS
5.0000 mg | ORAL_TABLET | Freq: Every evening | ORAL | Status: DC | PRN
  Administered 2015-10-06: 5 mg via ORAL
  Filled 2015-10-06: qty 1

## 2015-10-06 MED ORDER — METFORMIN HCL 500 MG PO TABS
1000.0000 mg | ORAL_TABLET | Freq: Two times a day (BID) | ORAL | Status: DC
  Administered 2015-10-06 – 2015-10-08 (×4): 1000 mg via ORAL
  Filled 2015-10-06 (×3): qty 2

## 2015-10-06 MED ORDER — NYSTATIN 100000 UNIT/GM EX CREA
TOPICAL_CREAM | Freq: Three times a day (TID) | CUTANEOUS | Status: DC
  Administered 2015-10-06: 1 via TOPICAL
  Filled 2015-10-06: qty 15

## 2015-10-06 MED ORDER — FUROSEMIDE 40 MG PO TABS
40.0000 mg | ORAL_TABLET | Freq: Every day | ORAL | Status: DC
  Administered 2015-10-06 – 2015-10-08 (×3): 40 mg via ORAL
  Filled 2015-10-06 (×3): qty 1

## 2015-10-06 NOTE — Progress Notes (Signed)
dc'ed pacing wires pt. tolerated well 

## 2015-10-06 NOTE — Progress Notes (Signed)
Pt O2 sat 87% on RA while sleeping.  2L [place] applied, pt O2 sat now 96%.  Patient resting comfortably, RN will cont to monitor.  Claudette Stapler, RN

## 2015-10-06 NOTE — Progress Notes (Signed)
CARDIAC REHAB PHASE I   PRE:  Rate/Rhythm: 75 SR  BP:  Supine:   Sitting: 121/76  Standing:    SaO2: 94 RA  MODE:  Ambulation: 550 ft   POST:  Rate/Rhythm: 93 SR  BP:  Supine:   Sitting: To bathroom  Standing:    SaO2:  1150-1230 Assisted X 1 and used walker to ambulate. Gait steady with walker. VS stable Pt to recliner after walker with call light in reach. Completed discharge education with pt. He voices understanding. Placed recovery from heart surgery video on for him to watch.  Rodney Langton RN 10/06/2015 12:29 PM

## 2015-10-06 NOTE — Progress Notes (Addendum)
      [address]       [place] [address]             [phone]        4 Days Post-Op Procedure(s) (LRB): CORONARY ARTERY BYPASS GRAFTING (CABG) X5 LIMA-LAD; SVG-DIAG; SVG-OM; SVG-PD; SVG-RAMUS TRANSESOPHAGEAL ECHOCARDIOGRAM (TEE) ENDOSCOPIC GREATER SAPHENOUS VEIN HARVEST BILAT LE (N/A) TRANSESOPHAGEAL ECHOCARDIOGRAM (TEE) (N/A)  Subjective: Patient did not sleep well and has had incisional pain.  Objective: Vital signs in last 24 hours: Temp:  [98.4 F (36.9 C)-99.3 F (37.4 C)] 98.4 F (36.9 C) (01/21 0506) Pulse Rate:  [72-97] 72 (01/21 0506) Cardiac Rhythm:  [-] Normal sinus rhythm (01/21 0811) Resp:  [18-21] 18 (01/21 0506) BP: (114-130)/(55-69) 114/69 mmHg (01/21 0506) SpO2:  [90 %-96 %] 94 % (01/21 0506) Weight:  [171 lb 1.6 oz (77.61 kg)] 171 lb 1.6 oz (77.61 kg) (01/21 0506)  Pre op weight 73 kg Current Weight  10/06/15 171 lb 1.6 oz (77.61 kg)       Intake/Output from previous day: 01/20 0701 - 01/21 0700 In: 960 [P.O.:960] Out: 2400 [Urine:2400]   Physical Exam:  Cardiovascular: RRR Pulmonary: Clear to auscultation bilaterally; no rales, wheezes, or rhonchi. Abdomen: Soft, non tender, bowel sounds present. Extremities: Mild bilateral lower extremity edema. Wounds: Clean and dry.  No erythema or signs of infection.  Lab Results: CBC: Recent Labs  10/04/15 0459 10/05/15 0328  WBC 9.8 11.6*  HGB 8.8* 9.6*  HCT 27.2* 29.7*  PLT 127* 141*   BMET:  Recent Labs  10/04/15 0459 10/05/15 0328  NA 137 137  K 4.1 4.1  CL 104 103  CO2 23 24  GLUCOSE 132* 104*  BUN 17 21*  CREATININE 1.32* 1.20  CALCIUM 8.5* 8.6*    PT/INR:  Lab Results  Component Value Date   INR 1.50* 10/02/2015   INR 1.24 09/26/2015   INR 1.09 04/13/2014   ABG:  INR: Will add last result for INR, ABG once components are confirmed Will add last 4 CBG results once components are confirmed  Assessment/Plan:  1. CV - S/p NSTEMI. Previous a  fib.  Rhythm appears regular irregular with HR in the 80's this am. On Amiodarone 200 mg bid and Lopressor 12.5 mg bid. 2.  Pulmonary - On 2 liters of oxygen via [place]. Wean as tolerates. Encourage incentive spirometer 3. Volume Overload - Will give Lasix 40 mg today 4.  Acute blood loss anemia - Last H and H stable at 9.6 and 29.7 5.DM-CBGs 136/200/86. On Insulin. Pre op HGA1C 7. Last creatinine down to 1.2. Will restart Metformin and Amaryl as taken pre op and stop scheduled Insulin. 6. Mild thrombocytopenia-last platelet up to 141,000 7. Remove EPW 8. Possibly home in 1-2 days  ZIMMERMAN,DONIELLE MPA-C 10/06/2015,8:34 AM  Patient seen and examined, agree with above He doesn't feel well today. Having a lot of incisional pain  Remo Lipps C. Roxan Hockey, MD [facility] [phone]

## 2015-10-07 LAB — BASIC METABOLIC PANEL
Anion gap: 6 (ref 5–15)
BUN: 24 mg/dL — ABNORMAL HIGH (ref 6–20)
CALCIUM: 8.3 mg/dL — AB (ref 8.9–10.3)
CHLORIDE: 105 mmol/L (ref 101–111)
CO2: 27 mmol/L (ref 22–32)
CREATININE: 1.32 mg/dL — AB (ref 0.61–1.24)
GFR, EST NON AFRICAN AMERICAN: 53 mL/min — AB (ref >60)
Glucose, Bld: 200 mg/dL — ABNORMAL HIGH (ref 65–99)
Potassium: 4.4 mmol/L (ref 3.5–5.1)
SODIUM: 138 mmol/L (ref 135–145)

## 2015-10-07 LAB — GLUCOSE, CAPILLARY
GLUCOSE-CAPILLARY: 182 mg/dL — AB (ref 65–99)
GLUCOSE-CAPILLARY: 163 mg/dL — AB (ref 65–99)
Glucose-Capillary: 115 mg/dL — ABNORMAL HIGH (ref 65–99)
Glucose-Capillary: 218 mg/dL — ABNORMAL HIGH (ref 65–99)

## 2015-10-07 NOTE — Progress Notes (Signed)
      [address]       [place] [address]             [phone]        5 Days Post-Op Procedure(s) (LRB): CORONARY ARTERY BYPASS GRAFTING (CABG) X5 LIMA-LAD; SVG-DIAG; SVG-OM; SVG-PD; SVG-RAMUS TRANSESOPHAGEAL ECHOCARDIOGRAM (TEE) ENDOSCOPIC GREATER SAPHENOUS VEIN HARVEST BILAT LE (N/A) TRANSESOPHAGEAL ECHOCARDIOGRAM (TEE) (N/A)  Subjective: Patient still with a fair amount of incisional pain.  Objective: Vital signs in last 24 hours: Temp:  [97.8 F (36.6 C)-98.8 F (37.1 C)] 98.8 F (37.1 C) (01/22 0548) Pulse Rate:  [76-88] 88 (01/22 0548) Cardiac Rhythm:  [-] Normal sinus rhythm (01/21 1900) Resp:  [16] 16 (01/22 0548) BP: (116-154)/(56-86) 154/86 mmHg (01/22 0548) SpO2:  [93 %-95 %] 93 % (01/22 0548) Weight:  [170 lb 3.2 oz (77.202 kg)] 170 lb 3.2 oz (77.202 kg) (01/22 0557)  Pre op weight 73 kg Current Weight  10/07/15 170 lb 3.2 oz (77.202 kg)      Intake/Output from previous day: 01/21 0701 - 01/22 0700 In: 360 [P.O.:360] Out: 1900 [Urine:1900]   Physical Exam:  Cardiovascular: RRR Pulmonary: Clear to auscultation bilaterally; no rales, wheezes, or rhonchi. Abdomen: Soft, non tender, bowel sounds present. Extremities: Mild bilateral lower extremity edema. Wounds: Clean and dry.  No erythema or signs of infection.  Lab Results: CBC:  Recent Labs  10/05/15 0328  WBC 11.6*  HGB 9.6*  HCT 29.7*  PLT 141*   BMET:   Recent Labs  10/05/15 0328 10/07/15 0343  NA 137 138  K 4.1 4.4  CL 103 105  CO2 24 27  GLUCOSE 104* 200*  BUN 21* 24*  CREATININE 1.20 1.32*  CALCIUM 8.6* 8.3*    PT/INR:  Lab Results  Component Value Date   INR 1.50* 10/02/2015   INR 1.24 09/26/2015   INR 1.09 04/13/2014   ABG:  INR: Will add last result for INR, ABG once components are confirmed Will add last 4 CBG results once components are confirmed  Assessment/Plan:  1. CV - S/p NSTEMI. Previous a fib.  SR with PACs. On Amiodarone 200  mg bid and Lopressor 12.5 mg bid. 2.  Pulmonary - On room air. Encourage incentive spirometer 3. Volume Overload - Will give Lasix 40 mg today 4.  Acute blood loss anemia - Last H and H stable at 9.6 and 29.7 5.DM-CBGs 284/147/182. Pre op HGA1C 7. Continue Metformin as taken pre op. 6. Mild thrombocytopenia-last platelet up to 141,000 7. Creatinine slightly increased to 1.32 8. Will likely discharge in am   Alashia Brownfield MPA-C 10/07/2015,8:02 AM

## 2015-10-08 LAB — GLUCOSE, CAPILLARY
Glucose-Capillary: 180 mg/dL — ABNORMAL HIGH (ref 65–99)
Glucose-Capillary: 164 mg/dL — ABNORMAL HIGH (ref 65–99)

## 2015-10-08 MED ORDER — AMIODARONE HCL 200 MG PO TABS: 200.0000 mg | ORAL_TABLET | Freq: Two times a day (BID) | ORAL | Status: DC

## 2015-10-08 MED ORDER — OXYCODONE HCL 5 MG PO TABS: 5.0000 mg | ORAL_TABLET | ORAL | Status: DC | PRN

## 2015-10-08 MED ORDER — LOSARTAN POTASSIUM 25 MG PO TABS: 25.0000 mg | ORAL_TABLET | Freq: Every day | ORAL | Status: DC

## 2015-10-08 MED ORDER — ASPIRIN 325 MG PO TBEC: 325.0000 mg | DELAYED_RELEASE_TABLET | Freq: Every day | ORAL | Status: DC

## 2015-10-08 MED ORDER — FUROSEMIDE 40 MG PO TABS: 40.0000 mg | ORAL_TABLET | Freq: Every day | ORAL | Status: DC

## 2015-10-08 MED ORDER — POTASSIUM CHLORIDE CRYS ER 20 MEQ PO TBCR: 20.0000 meq | EXTENDED_RELEASE_TABLET | Freq: Every day | ORAL | Status: DC

## 2015-10-08 NOTE — Care Management Note (Signed)
Case Management Note Previous CM note initiated by Luz Lex RN, CM  Patient Details  Name: Fernando Hess MRN: [id number] Date of Birth: [birth year]  Subjective/Objective:     Initial note by Waunita Schooner, CM.  Pt admitted with NON STEMI, pt is scheduled for CABG 10/02/15             Action/Plan:  Pt is independent from home with wife.  Wife will provide 24 hour supervision post discharge.  CM will continue to monitor for disposition needs   Expected Discharge Date:  10-08-15               Expected Discharge Plan:  Home/Self Care  In-House Referral:     Discharge planning Services  CM Consult  Post Acute Care Choice:    Choice offered to:     DME Arranged:    DME Agency:     HH Arranged:    [place] Agency:     Status of Service:  Completed, signed off  Medicare Important Message Given:  Yes Date Medicare IM Given:    Medicare IM give by:    Date Additional Medicare IM Given:    Additional Medicare Important Message give by:     If discussed at [place] of Stay Meetings, dates discussed:    Discharge Disposition: Home/self care   Additional Comments: Post op CABG on 1-17.  10/08/15- pt for d/c home today with wife- no CM needs noted.   Dahlia Client [place], RN 10/08/2015, 10:31 AM  [phone]

## 2015-10-08 NOTE — Care Management Important Message (Signed)
Important Message  Patient Details  Name: Fernando Hess MRN: [id number] Date of Birth: 1946-06-13   Medicare Important Message Given:  Yes    Darneshia Demary P Seymore Brodowski 10/08/2015, 2:21 PM

## 2015-10-08 NOTE — Progress Notes (Addendum)
[address]       [place] [address]             [phone]      6 Days Post-Op Procedure(s) (LRB): CORONARY ARTERY BYPASS GRAFTING (CABG) X5 LIMA-LAD; SVG-DIAG; SVG-OM; SVG-PD; SVG-RAMUS TRANSESOPHAGEAL ECHOCARDIOGRAM (TEE) ENDOSCOPIC GREATER SAPHENOUS VEIN HARVEST BILAT LE (N/A) TRANSESOPHAGEAL ECHOCARDIOGRAM (TEE) (N/A) Subjective: Feels some soreness, but overall feels like he is progressing pretty well  Objective: Vital signs in last 24 hours: Temp:  [97.4 F (36.3 C)-99.7 F (37.6 C)] 98.7 F (37.1 C) (01/23 0519) Pulse Rate:  [81-96] 81 (01/23 0519) Cardiac Rhythm:  [-] Normal sinus rhythm (01/22 1900) Resp:  [18-20] 18 (01/23 0519) BP: (119-127)/(63-69) 127/64 mmHg (01/23 0519) SpO2:  [93 %-95 %] 93 % (01/23 0519) Weight:  [167 lb 14.4 oz (76.159 kg)] 167 lb 14.4 oz (76.159 kg) (01/23 0519)  Hemodynamic parameters for last 24 hours:    Intake/Output from previous day: 01/22 0701 - 01/23 0700 In: 480 [P.O.:480] Out: -  Intake/Output this shift:    General appearance: alert, cooperative and no distress Heart: irregular rate and rhythm Lungs: mildly dim in left base Abdomen: benign Extremities: + LE edema Wound: incis healing well  Lab Results: No results for input(s): WBC, HGB, HCT, PLT in the last 72 hours. BMET:  Recent Labs  10/07/15 0343  NA 138  K 4.4  CL 105  CO2 27  GLUCOSE 200*  BUN 24*  CREATININE 1.32*  CALCIUM 8.3*    PT/INR: No results for input(s): LABPROT, INR in the last 72 hours. ABG    Component Value Date/Time   PHART 7.434 10/03/2015 0746   HCO3 24.3* 10/03/2015 0746   TCO2 21 10/03/2015 1552   ACIDBASEDEF 2.0 10/03/2015 0456   O2SAT 95.0 10/03/2015 0746   CBG (last 3)   Recent Labs  10/07/15 1640 10/07/15 2144 10/08/15 0617  GLUCAP 115* 218* 180*    Meds Scheduled Meds: . amiodarone  200 mg Oral BID  . aspirin EC  325 mg Oral Daily   Or  . aspirin  324 mg Per Tube Daily  .  atorvastatin  40 mg Oral Daily  . bisacodyl  10 mg Oral Daily   Or  . bisacodyl  10 mg Rectal Daily  . docusate sodium  200 mg Oral Daily  . furosemide  40 mg Oral Daily  . insulin aspart  0-24 Units Subcutaneous TID AC & HS  . metFORMIN  1,000 mg Oral BID WC  . metoprolol tartrate  12.5 mg Oral BID   Or  . metoprolol tartrate  12.5 mg Per Tube BID  . nystatin cream   Topical TID  . pantoprazole  40 mg Oral Daily  . potassium chloride  20 mEq Oral Daily  . sodium chloride  3 mL Intravenous Q12H  . sodium chloride  3 mL Intravenous Q12H   Continuous Infusions:  PRN Meds:.sodium chloride, alum & mag hydroxide-simeth, dextromethorphan-guaiFENesin, levalbuterol, magnesium hydroxide, metoprolol, midazolam, ondansetron (ZOFRAN) IV, oxyCODONE, sodium chloride, sodium chloride, traMADol, zolpidem  Xrays No results found.  Assessment/Plan: S/P Procedure(s) (LRB): CORONARY ARTERY BYPASS GRAFTING (CABG) X5 LIMA-LAD; SVG-DIAG; SVG-OM; SVG-PD; SVG-RAMUS TRANSESOPHAGEAL ECHOCARDIOGRAM (TEE) ENDOSCOPIC GREATER SAPHENOUS VEIN HARVEST BILAT LE (N/A) TRANSESOPHAGEAL ECHOCARDIOGRAM (TEE) (N/A)  1 conts to progress well overall 2 rhythm stable wit PAC's, no afib 3 will cont diuretic as OP for volume overload 4 sugars fairly well controlled- restart amaryl at dischage 5 stable for d/c  LOS: 14 days    GOLD,WAYNE E 10/08/2015  patient examined and medical record reviewed,agree with above note. DC instructions reviewed with patrient Tharon Aquas Trigt III 10/08/2015

## 2015-10-22 ENCOUNTER — Encounter (HOSPITAL_COMMUNITY): Payer: Self-pay | Admitting: *Deleted

## 2015-10-22 ENCOUNTER — Inpatient Hospital Stay (HOSPITAL_COMMUNITY)
Admission: EM | Admit: 2015-10-22 | Discharge: 2015-10-25 | DRG: 282 | Disposition: A | Discharge status: Home Health | Payer: Medicare Other | Attending: Cardiology | Admitting: Cardiology

## 2015-10-22 ENCOUNTER — Encounter (HOSPITAL_COMMUNITY)
Admission: EM | Disposition: A | Discharge status: Home Health | Payer: Self-pay | Source: Home / Self Care | Attending: Cardiology

## 2015-10-22 ENCOUNTER — Emergency Department (HOSPITAL_COMMUNITY): Payer: Medicare Other

## 2015-10-22 DIAGNOSIS — Z8249 Family history of ischemic heart disease and other diseases of the circulatory system: Secondary | ICD-10-CM

## 2015-10-22 DIAGNOSIS — F4320 Adjustment disorder, unspecified: Secondary | ICD-10-CM | POA: Diagnosis present

## 2015-10-22 DIAGNOSIS — T462X5A Adverse effect of other antidysrhythmic drugs, initial encounter: Secondary | ICD-10-CM | POA: Diagnosis present

## 2015-10-22 DIAGNOSIS — Z800 Family history of malignant neoplasm of digestive organs: Secondary | ICD-10-CM

## 2015-10-22 DIAGNOSIS — E785 Hyperlipidemia, unspecified: Secondary | ICD-10-CM | POA: Diagnosis present

## 2015-10-22 DIAGNOSIS — I2510 Atherosclerotic heart disease of native coronary artery without angina pectoris: Secondary | ICD-10-CM | POA: Diagnosis present

## 2015-10-22 DIAGNOSIS — I2582 Chronic total occlusion of coronary artery: Secondary | ICD-10-CM | POA: Diagnosis present

## 2015-10-22 DIAGNOSIS — E1121 Type 2 diabetes mellitus with diabetic nephropathy: Secondary | ICD-10-CM | POA: Diagnosis present

## 2015-10-22 DIAGNOSIS — I2119 ST elevation (STEMI) myocardial infarction involving other coronary artery of inferior wall: Secondary | ICD-10-CM

## 2015-10-22 DIAGNOSIS — R110 Nausea: Secondary | ICD-10-CM | POA: Diagnosis present

## 2015-10-22 DIAGNOSIS — Z630 Problems in relationship with spouse or partner: Secondary | ICD-10-CM | POA: Diagnosis not present

## 2015-10-22 DIAGNOSIS — Z87442 Personal history of urinary calculi: Secondary | ICD-10-CM

## 2015-10-22 DIAGNOSIS — I25810 Atherosclerosis of coronary artery bypass graft(s) without angina pectoris: Secondary | ICD-10-CM | POA: Diagnosis present

## 2015-10-22 DIAGNOSIS — Z951 Presence of aortocoronary bypass graft: Secondary | ICD-10-CM

## 2015-10-22 DIAGNOSIS — E875 Hyperkalemia: Secondary | ICD-10-CM | POA: Diagnosis present

## 2015-10-22 DIAGNOSIS — Z833 Family history of diabetes mellitus: Secondary | ICD-10-CM

## 2015-10-22 DIAGNOSIS — I213 ST elevation (STEMI) myocardial infarction of unspecified site: Secondary | ICD-10-CM

## 2015-10-22 DIAGNOSIS — Z7982 Long term (current) use of aspirin: Secondary | ICD-10-CM

## 2015-10-22 DIAGNOSIS — D500 Iron deficiency anemia secondary to blood loss (chronic): Secondary | ICD-10-CM | POA: Diagnosis not present

## 2015-10-22 DIAGNOSIS — E1151 Type 2 diabetes mellitus with diabetic peripheral angiopathy without gangrene: Secondary | ICD-10-CM | POA: Diagnosis present

## 2015-10-22 DIAGNOSIS — I129 Hypertensive chronic kidney disease with stage 1 through stage 4 chronic kidney disease, or unspecified chronic kidney disease: Secondary | ICD-10-CM | POA: Diagnosis present

## 2015-10-22 DIAGNOSIS — I252 Old myocardial infarction: Secondary | ICD-10-CM

## 2015-10-22 DIAGNOSIS — N183 Chronic kidney disease, stage 3 (moderate): Secondary | ICD-10-CM | POA: Diagnosis present

## 2015-10-22 DIAGNOSIS — E1122 Type 2 diabetes mellitus with diabetic chronic kidney disease: Secondary | ICD-10-CM | POA: Diagnosis present

## 2015-10-22 DIAGNOSIS — Z881 Allergy status to other antibiotic agents status: Secondary | ICD-10-CM

## 2015-10-22 DIAGNOSIS — I214 Non-ST elevation (NSTEMI) myocardial infarction: Secondary | ICD-10-CM | POA: Diagnosis present

## 2015-10-22 DIAGNOSIS — Z7984 Long term (current) use of oral hypoglycemic drugs: Secondary | ICD-10-CM | POA: Diagnosis not present

## 2015-10-22 DIAGNOSIS — Z79899 Other long term (current) drug therapy: Secondary | ICD-10-CM

## 2015-10-22 DIAGNOSIS — Z888 Allergy status to other drugs, medicaments and biological substances status: Secondary | ICD-10-CM

## 2015-10-22 HISTORY — PX: CARDIAC CATHETERIZATION: SHX172

## 2015-10-22 HISTORY — DX: Old myocardial infarction: I25.2

## 2015-10-22 HISTORY — DX: ST elevation (STEMI) myocardial infarction involving other coronary artery of inferior wall: I21.19

## 2015-10-22 LAB — COMPREHENSIVE METABOLIC PANEL
ALBUMIN: 2.7 g/dL — AB (ref 3.5–5.0)
ALK PHOS: 100 U/L (ref 38–126)
ALT: 16 U/L — ABNORMAL LOW (ref 17–63)
ANION GAP: 13 (ref 5–15)
AST: 20 U/L (ref 15–41)
BUN: 33 mg/dL — ABNORMAL HIGH (ref 6–20)
CHLORIDE: 103 mmol/L (ref 101–111)
CO2: 22 mmol/L (ref 22–32)
Calcium: 9.1 mg/dL (ref 8.9–10.3)
Creatinine, Ser: 1.55 mg/dL — ABNORMAL HIGH (ref 0.61–1.24)
GFR calc Af Amer: 50 mL/min — ABNORMAL LOW (ref >60)
GFR calc non Af Amer: 43 mL/min — ABNORMAL LOW (ref >60)
GLUCOSE: 46 mg/dL — AB (ref 65–99)
POTASSIUM: 5 mmol/L (ref 3.5–5.1)
SODIUM: 138 mmol/L (ref 135–145)
Total Bilirubin: 0.8 mg/dL (ref 0.3–1.2)
Total Protein: 5.5 g/dL — ABNORMAL LOW (ref 6.5–8.1)

## 2015-10-22 LAB — I-STAT CHEM 8, ED
BUN: 35 mg/dL — AB (ref 6–20)
CHLORIDE: 106 mmol/L (ref 101–111)
CREATININE: 1.4 mg/dL — AB (ref 0.61–1.24)
Calcium, Ion: 1.3 mmol/L (ref 1.13–1.30)
Glucose, Bld: 50 mg/dL — ABNORMAL LOW (ref 65–99)
HEMATOCRIT: 39 % (ref 39.0–52.0)
Hemoglobin: 13.3 g/dL (ref 13.0–17.0)
Potassium: 5.1 mmol/L (ref 3.5–5.1)
Sodium: 141 mmol/L (ref 135–145)
TCO2: 23 mmol/L (ref 0–100)

## 2015-10-22 LAB — BASIC METABOLIC PANEL
Anion gap: 14 (ref 5–15)
BUN: 35 mg/dL — AB (ref 6–20)
CO2: 23 mmol/L (ref 22–32)
CREATININE: 1.6 mg/dL — AB (ref 0.61–1.24)
Calcium: 9.7 mg/dL (ref 8.9–10.3)
Chloride: 100 mmol/L — ABNORMAL LOW (ref 101–111)
GFR calc Af Amer: 48 mL/min — ABNORMAL LOW (ref >60)
GFR calc non Af Amer: 42 mL/min — ABNORMAL LOW (ref >60)
Glucose, Bld: 57 mg/dL — ABNORMAL LOW (ref 65–99)
Potassium: 5.4 mmol/L — ABNORMAL HIGH (ref 3.5–5.1)
SODIUM: 137 mmol/L (ref 135–145)

## 2015-10-22 LAB — I-STAT TROPONIN, ED: Troponin i, poc: 0.05 ng/mL (ref 0.00–0.08)

## 2015-10-22 LAB — CBC
HEMATOCRIT: 35.7 % — AB (ref 39.0–52.0)
Hemoglobin: 11.3 g/dL — ABNORMAL LOW (ref 13.0–17.0)
MCH: 27.3 pg (ref 26.0–34.0)
MCHC: 31.7 g/dL (ref 30.0–36.0)
MCV: 86.2 fL (ref 78.0–100.0)
PLATELETS: 804 10*3/uL — AB (ref 150–400)
RBC: 4.14 MIL/uL — AB (ref 4.22–5.81)
RDW: 17 % — AB (ref 11.5–15.5)
WBC: 10.4 10*3/uL (ref 4.0–10.5)

## 2015-10-22 LAB — BRAIN NATRIURETIC PEPTIDE: B NATRIURETIC PEPTIDE 5: 232.3 pg/mL — AB (ref 0.0–100.0)

## 2015-10-22 LAB — TSH: TSH: 0.468 u[IU]/mL (ref 0.350–4.500)

## 2015-10-22 SURGERY — LEFT HEART CATH AND CORONARY ANGIOGRAPHY

## 2015-10-22 MED ORDER — LIDOCAINE HCL (PF) 1 % IJ SOLN
INTRAMUSCULAR | Status: AC
  Filled 2015-10-22: qty 30

## 2015-10-22 MED ORDER — HEPARIN SODIUM (PORCINE) 1000 UNIT/ML IJ SOLN
INTRAMUSCULAR | Status: DC | PRN
  Administered 2015-10-22: 4000 [IU] via INTRAVENOUS

## 2015-10-22 MED ORDER — NITROGLYCERIN 0.4 MG SL SUBL: 0.4000 mg | SUBLINGUAL_TABLET | SUBLINGUAL | Status: DC | PRN

## 2015-10-22 MED ORDER — ONDANSETRON 4 MG PO TBDP
4.0000 mg | ORAL_TABLET | Freq: Three times a day (TID) | ORAL | Status: DC | PRN
  Filled 2015-10-22: qty 1

## 2015-10-22 MED ORDER — NITROGLYCERIN 1 MG/10 ML FOR IR/CATH LAB
INTRA_ARTERIAL | Status: AC
  Filled 2015-10-22: qty 10

## 2015-10-22 MED ORDER — HEPARIN (PORCINE) IN NACL 2-0.9 UNIT/ML-% IJ SOLN
INTRAMUSCULAR | Status: AC
  Filled 2015-10-22: qty 500

## 2015-10-22 MED ORDER — IOHEXOL 350 MG/ML SOLN
INTRAVENOUS | Status: DC | PRN
  Administered 2015-10-22: 85 mL via INTRA_ARTERIAL

## 2015-10-22 MED ORDER — SODIUM CHLORIDE 0.9 % IV SOLN: INTRAVENOUS | Status: DC

## 2015-10-22 MED ORDER — SODIUM CHLORIDE 0.9 % IV SOLN: 250.0000 mL | INTRAVENOUS | Status: DC | PRN

## 2015-10-22 MED ORDER — PANTOPRAZOLE SODIUM 40 MG PO TBEC
40.0000 mg | DELAYED_RELEASE_TABLET | Freq: Every day | ORAL | Status: DC
Start: 2015-10-22 — End: 2015-10-25
  Administered 2015-10-22 – 2015-10-25 (×4): 40 mg via ORAL
  Filled 2015-10-22 (×4): qty 1

## 2015-10-22 MED ORDER — MIDAZOLAM HCL 2 MG/2ML IJ SOLN
INTRAMUSCULAR | Status: AC
  Filled 2015-10-22: qty 2

## 2015-10-22 MED ORDER — CLOPIDOGREL BISULFATE 75 MG PO TABS
75.0000 mg | ORAL_TABLET | Freq: Every day | ORAL | Status: DC
  Administered 2015-10-23 – 2015-10-25 (×3): 75 mg via ORAL
  Filled 2015-10-22 (×3): qty 1

## 2015-10-22 MED ORDER — HEPARIN (PORCINE) IN NACL 2-0.9 UNIT/ML-% IJ SOLN
INTRAMUSCULAR | Status: DC | PRN
  Administered 2015-10-22: 1500 mL

## 2015-10-22 MED ORDER — SODIUM CHLORIDE 0.9% FLUSH: 3.0000 mL | INTRAVENOUS | Status: DC | PRN

## 2015-10-22 MED ORDER — SODIUM CHLORIDE 0.9 % IV SOLN
INTRAVENOUS | Status: DC | PRN
  Administered 2015-10-22: 500 mL via INTRAVENOUS

## 2015-10-22 MED ORDER — ASPIRIN EC 325 MG PO TBEC: 325.0000 mg | DELAYED_RELEASE_TABLET | Freq: Every day | ORAL | Status: DC

## 2015-10-22 MED ORDER — METOPROLOL SUCCINATE ER 25 MG PO TB24
25.0000 mg | ORAL_TABLET | Freq: Every day | ORAL | Status: DC
Start: 2015-10-22 — End: 2015-10-25
  Administered 2015-10-22 – 2015-10-25 (×4): 25 mg via ORAL
  Filled 2015-10-22 (×4): qty 1

## 2015-10-22 MED ORDER — OXYCODONE-ACETAMINOPHEN 5-325 MG PO TABS
1.0000 | ORAL_TABLET | ORAL | Status: DC | PRN
  Administered 2015-10-23 (×2): 2 via ORAL
  Administered 2015-10-23: 1 via ORAL
  Administered 2015-10-24 – 2015-10-25 (×4): 2 via ORAL
  Filled 2015-10-22 (×2): qty 2
  Filled 2015-10-22: qty 1
  Filled 2015-10-22 (×4): qty 2

## 2015-10-22 MED ORDER — ASPIRIN 300 MG RE SUPP: 300.0000 mg | RECTAL | Status: AC

## 2015-10-22 MED ORDER — LIDOCAINE HCL (PF) 1 % IJ SOLN
INTRAMUSCULAR | Status: DC | PRN
  Administered 2015-10-22: 20 mL via SUBCUTANEOUS

## 2015-10-22 MED ORDER — MIDAZOLAM HCL 2 MG/2ML IJ SOLN
INTRAMUSCULAR | Status: DC | PRN
  Administered 2015-10-22 (×3): 1 mg via INTRAVENOUS

## 2015-10-22 MED ORDER — HEPARIN SODIUM (PORCINE) 1000 UNIT/ML IJ SOLN
INTRAMUSCULAR | Status: AC
  Filled 2015-10-22: qty 1

## 2015-10-22 MED ORDER — VERAPAMIL HCL 2.5 MG/ML IV SOLN
INTRAVENOUS | Status: AC
  Filled 2015-10-22: qty 2

## 2015-10-22 MED ORDER — ATORVASTATIN CALCIUM 40 MG PO TABS
40.0000 mg | ORAL_TABLET | Freq: Every day | ORAL | Status: DC
  Administered 2015-10-22 – 2015-10-25 (×4): 40 mg via ORAL
  Filled 2015-10-22 (×4): qty 1

## 2015-10-22 MED ORDER — ASPIRIN EC 81 MG PO TBEC
81.0000 mg | DELAYED_RELEASE_TABLET | Freq: Every day | ORAL | Status: DC
  Administered 2015-10-23 – 2015-10-25 (×3): 81 mg via ORAL
  Filled 2015-10-22 (×3): qty 1

## 2015-10-22 MED ORDER — ONDANSETRON HCL 4 MG/2ML IJ SOLN: 4.0000 mg | Freq: Four times a day (QID) | INTRAMUSCULAR | Status: DC | PRN

## 2015-10-22 MED ORDER — ASPIRIN 81 MG PO CHEW
324.0000 mg | CHEWABLE_TABLET | Freq: Once | ORAL | Status: AC
  Administered 2015-10-22: 324 mg via ORAL
  Filled 2015-10-22: qty 4

## 2015-10-22 MED ORDER — CLOPIDOGREL BISULFATE 300 MG PO TABS
300.0000 mg | ORAL_TABLET | Freq: Once | ORAL | Status: AC
  Administered 2015-10-22: 300 mg via ORAL
  Filled 2015-10-22: qty 1

## 2015-10-22 MED ORDER — FENTANYL CITRATE (PF) 100 MCG/2ML IJ SOLN
INTRAMUSCULAR | Status: AC
  Filled 2015-10-22: qty 2

## 2015-10-22 MED ORDER — AMIODARONE HCL 200 MG PO TABS
200.0000 mg | ORAL_TABLET | Freq: Two times a day (BID) | ORAL | Status: DC
  Administered 2015-10-22: 200 mg via ORAL
  Filled 2015-10-22: qty 1

## 2015-10-22 MED ORDER — FENTANYL CITRATE (PF) 100 MCG/2ML IJ SOLN
INTRAMUSCULAR | Status: DC | PRN
  Administered 2015-10-22 (×2): 25 ug via INTRAVENOUS
  Administered 2015-10-22: 50 ug via INTRAVENOUS

## 2015-10-22 MED ORDER — FUROSEMIDE 40 MG PO TABS: 40.0000 mg | ORAL_TABLET | Freq: Every day | ORAL | Status: DC

## 2015-10-22 MED ORDER — INSULIN ASPART 100 UNIT/ML ~~LOC~~ SOLN
0.0000 [IU] | Freq: Three times a day (TID) | SUBCUTANEOUS | Status: DC
  Administered 2015-10-23 (×2): 5 [IU] via SUBCUTANEOUS
  Administered 2015-10-23 – 2015-10-24 (×2): 3 [IU] via SUBCUTANEOUS
  Administered 2015-10-24: 5 [IU] via SUBCUTANEOUS
  Administered 2015-10-24: 3 [IU] via SUBCUTANEOUS
  Administered 2015-10-25: 2 [IU] via SUBCUTANEOUS
  Administered 2015-10-25: 3 [IU] via SUBCUTANEOUS

## 2015-10-22 MED ORDER — ASPIRIN 81 MG PO CHEW
324.0000 mg | CHEWABLE_TABLET | ORAL | Status: AC
  Filled 2015-10-22: qty 4

## 2015-10-22 MED ORDER — LOSARTAN POTASSIUM 25 MG PO TABS
25.0000 mg | ORAL_TABLET | Freq: Every day | ORAL | Status: DC
  Administered 2015-10-22 – 2015-10-25 (×4): 25 mg via ORAL
  Filled 2015-10-22 (×4): qty 1

## 2015-10-22 MED ORDER — ASPIRIN 81 MG PO CHEW
81.0000 mg | CHEWABLE_TABLET | Freq: Every day | ORAL | Status: DC
  Administered 2015-10-23: 81 mg via ORAL
  Filled 2015-10-22: qty 1

## 2015-10-22 MED ORDER — HEPARIN BOLUS VIA INFUSION
4000.0000 [IU] | Freq: Once | INTRAVENOUS | Status: DC
  Filled 2015-10-22: qty 4000

## 2015-10-22 MED ORDER — HEPARIN (PORCINE) IN NACL 100-0.45 UNIT/ML-% IJ SOLN
1200.0000 [IU]/h | INTRAMUSCULAR | Status: DC
  Administered 2015-10-23: 900 [IU]/h via INTRAVENOUS
  Administered 2015-10-24: 1200 [IU]/h via INTRAVENOUS
  Filled 2015-10-22: qty 250

## 2015-10-22 MED ORDER — SODIUM CHLORIDE 0.9% FLUSH
3.0000 mL | Freq: Two times a day (BID) | INTRAVENOUS | Status: DC
  Administered 2015-10-22 – 2015-10-25 (×3): 3 mL via INTRAVENOUS

## 2015-10-22 MED ORDER — HEPARIN (PORCINE) IN NACL 100-0.45 UNIT/ML-% IJ SOLN
900.0000 [IU]/h | INTRAMUSCULAR | Status: DC
  Filled 2015-10-22: qty 250

## 2015-10-22 MED ORDER — ACETAMINOPHEN 325 MG PO TABS: 650.0000 mg | ORAL_TABLET | ORAL | Status: DC | PRN

## 2015-10-22 MED ORDER — ONDANSETRON HCL 4 MG/2ML IJ SOLN
4.0000 mg | Freq: Once | INTRAMUSCULAR | Status: AC
  Administered 2015-10-22: 4 mg via INTRAVENOUS
  Filled 2015-10-22: qty 2

## 2015-10-22 SURGICAL SUPPLY — 13 items
CATH INFINITI 5 FR AL2 (CATHETERS) ×2 IMPLANT
CATH INFINITI 5 FR LCB (CATHETERS) ×2 IMPLANT
CATH INFINITI 5FR MULTPACK ANG (CATHETERS) ×2 IMPLANT
DEVICE CLOSURE PERCLS PRGLD 6F (VASCULAR PRODUCTS) IMPLANT
KIT ENCORE 26 ADVANTAGE (KITS) IMPLANT
KIT HEART LEFT (KITS) ×3 IMPLANT
PACK CARDIAC CATHETERIZATION (CUSTOM PROCEDURE TRAY) ×3 IMPLANT
PERCLOSE PROGLIDE 6F (VASCULAR PRODUCTS) ×3
SHEATH PINNACLE 6F 10CM (SHEATH) ×2 IMPLANT
SYR MEDRAD MARK V 150ML (SYRINGE) ×3 IMPLANT
TRANSDUCER W/STOPCOCK (MISCELLANEOUS) ×3 IMPLANT
TUBING CIL FLEX 10 FLL-RA (TUBING) ×3 IMPLANT
WIRE EMERALD 3MM-J .035X150CM (WIRE) ×2 IMPLANT

## 2015-10-22 NOTE — ED Notes (Signed)
Pt and family reports pt having recent heart surgery. Still having fatigue, no appetite and not feeling well, went to clinic and sent here due to high potassium level. EKG done at triage, no acute distress noted at this time.

## 2015-10-22 NOTE — H&P (Signed)
History and Physical  Patient ID: Fernando Hess MRN: [id number], SOB: [birth year] 70 y.o. Date of Encounter: 10/22/2015, 9:11 PM  Primary Physician:  Melinda Crutch, MD Primary Cardiologist: Dr [name]  Chief Complaint: Lorene Dy  HPI: 70 y.o. male w/ PMHx significant for CAD and recent CABG who was referred by his PCP's office to the Emergency room for evaluation of hyperkalemia (reportedly 6.5 on outpatient labs).   On arrival, an EKG showed inferolateral ST elevation and urgent cardiology consultation is called. He recently presented with NSTEMI-ACS and underwent CABG for multivessel CAD, LV dysfunction, and diabetes. He was treated with a LIMA-LAD, SVG-ramus, SVG-OM, SVG-diagonal, and SVG-right PDA. Post-operative course was notable for no major complications. However, the patient hasn't felt well since discharge home from the hospital. He has complained of nausea, vomiting, and malaise. He felt particularly bad yesterday and couldn't hold down food or fluids. He was seen at a walk-in clinic today and labs were drawn. Found to have hyperkalemia and sent to the ER.  The patient denies chest pain or pressure. No dyspnea. He does complain of generalized weakness, lethargy, and nausea.    Past Medical History  Diagnosis Date  . Diabetes mellitus without complication ([place])   . Hypertension   . Hyperlipidemia   . Kidney stones   . CAD (coronary artery disease), native coronary artery 09/24/2015  . History of kidney stones   . ST elevation myocardial infarction (STEMI) of inferior wall ([place]) 10/22/2015     Surgical History:  Past Surgical History  Procedure Laterality Date  . Cataract extraction, bilateral    . Leg surgery  70 yrs old    from fx. right  . Appendectomy    . Left heart catheterization with coronary angiogram N/A 04/13/2014    Procedure: LEFT HEART CATHETERIZATION WITH CORONARY ANGIOGRAM;  Surgeon: Jacolyn Reedy, MD;  Location: [facility] CATH LAB;  Service: Cardiovascular;   Laterality: N/A;  . Cardiac catheterization N/A 09/26/2015    Procedure: Left Heart Cath and Coronary Angiography;  Surgeon: Troy Sine, MD;  Location: [facility] CV LAB;  Service: Cardiovascular;  Laterality: N/A;  . Coronary artery bypass graft N/A 10/02/2015    Procedure: CORONARY ARTERY BYPASS GRAFTING (CABG) X5 LIMA-LAD; SVG-DIAG; SVG-OM; SVG-PD; SVG-RAMUS TRANSESOPHAGEAL ECHOCARDIOGRAM (TEE) ENDOSCOPIC GREATER SAPHENOUS VEIN HARVEST BILAT LE;  Surgeon: Ivin Poot, MD;  Location: [facility];  Service: Open Heart Surgery;  Laterality: N/A;  . Tee without cardioversion N/A 10/02/2015    Procedure: TRANSESOPHAGEAL ECHOCARDIOGRAM (TEE);  Surgeon: Ivin Poot, MD;  Location: [facility];  Service: Open Heart Surgery;  Laterality: N/A;     Home Meds: Prior to Admission medications   Medication Sig Start Date End Date Taking? Authorizing Provider  amiodarone (PACERONE) 200 MG tablet Take 1 tablet (200 mg total) by mouth 2 (two) times daily. 10/08/15   John Giovanni, [name]  aspirin EC 325 MG EC tablet Take 1 tablet (325 mg total) by mouth daily. 10/08/15   Wayne E Gold, [name]  atorvastatin (LIPITOR) 40 MG tablet Take 40 mg by mouth daily.    Historical Provider, MD  furosemide (LASIX) 40 MG tablet Take 1 tablet (40 mg total) by mouth daily. 10/08/15   Wayne E Gold, [name]  glimepiride (AMARYL) 4 MG tablet Take 8 mg by mouth daily with breakfast. Takes 1 and 1/2 tablets by mouth daily with breakfast    Historical Provider, MD  losartan (COZAAR) 25 MG tablet Take 1 tablet (25 mg total)  by mouth daily. 10/08/15   Wayne E Gold, [name]  metFORMIN (GLUCOPHAGE) 500 MG tablet Take 2 tablets (1,000 mg total) by mouth 2 (two) times daily with a meal. Begin taking on Saturday morning 04/13/14   Jacolyn Reedy, MD  metoprolol succinate (TOPROL-XL) 25 MG 24 hr tablet Take 25 mg by mouth daily.    Historical Provider, MD  Multiple Vitamin (MULTIVITAMIN) tablet Take 1 tablet by mouth daily.    Historical Provider, MD    ondansetron (ZOFRAN-ODT) 4 MG disintegrating tablet Take 4 mg by mouth every 8 (eight) hours as needed for nausea or vomiting. Pt states he takes at home when he feels nauseous    Historical Provider, MD  ONE TOUCH ULTRA TEST test strip  10/31/13   Historical Provider, MD  oxyCODONE (OXY IR/ROXICODONE) 5 MG immediate release tablet Take 1-2 tablets (5-10 mg total) by mouth every 4 (four) hours as needed for severe pain. 10/08/15   Wayne E Gold, [name]  pantoprazole (PROTONIX) 40 MG tablet Take 40 mg by mouth daily.    Historical Provider, MD  potassium chloride SA (K-DUR,KLOR-CON) 20 MEQ tablet Take 1 tablet (20 mEq total) by mouth daily. 10/08/15   John Giovanni, [name]    Allergies:  Allergies  Allergen Reactions  . Actos [Pioglitazone]     Itching, rash   . Levaquin [Levofloxacin] Itching and Rash  . Lisinopril Itching and Rash    Social History   Social History  . Marital Status: Married    Spouse Name: N/A  . Number of Children: N/A  . Years of Education: N/A   Occupational History  . Not on file.   Social History Main Topics  . Smoking status: Never Smoker   . Smokeless tobacco: Never Used  . Alcohol Use: Yes     Comment: 1 beer/wine every 2-3 months per patient  . Drug Use: No  . Sexual Activity: Not on file   Other Topics Concern  . Not on file   Social History Narrative     Family History  Problem Relation Age of Onset  . Heart attack Mother   . Heart attack Father   . Diabetes Brother   . Pancreatic cancer Brother   . Colon cancer Neg Hx   . Esophageal cancer Neg Hx   . Prostate cancer Neg Hx   . Rectal cancer Neg Hx   . Stomach cancer Neg Hx   . Diabetes Brother     Review of Systems: Positive for leg pain at incision sites from saphenous vein harvest, All other systems reviewed and are otherwise negative except as noted above.  Physical Exam: Blood pressure 151/66, pulse 108, temperature 98.1 F (36.7 C), temperature source Oral, resp. rate 25, height  5\' 6"  (1.676 m), weight 76.2 kg (167 lb 15.9 oz), SpO2 0 %. General: Pale, somewhat ill-appearing male, alert and oriented, in no acute distress. HEENT: Normocephalic, atraumatic, sclera anicteric Neck: Supple. Carotids 2+ without bruits. JVP normal Lungs: Clear bilaterally to auscultation without wheezes, rales, or rhonchi. Breathing is unlabored. Heart: RRR with normal S1 and S2. No murmurs, rubs, or gallops appreciated. Abdomen: Soft, non-tender, non-distended with normoactive bowel sounds. No hepatomegaly. No rebound/guarding. No obvious abdominal masses. Back: No CVA tenderness Msk:  Strength and tone appear normal for age. Extremities: No clubbing, cyanosis, or edema.  Distal pedal pulses are 2+ and equal bilaterally.  Neuro: CNII-XII intact, moves all extremities spontaneously. Psych:  Responds to questions appropriately with a normal affect. Skin:  warm and dry without rash   Labs:   Lab Results  Component Value Date   WBC 10.4 10/22/2015   HGB 13.3 10/22/2015   HCT 39.0 10/22/2015   MCV 86.2 10/22/2015   PLT 804* 10/22/2015    Recent Labs Lab 10/22/15 1748 10/22/15 1816  NA 137 141  K 5.4* 5.1  CL 100* 106  CO2 23  --   BUN 35* 35*  CREATININE 1.60* 1.40*  CALCIUM 9.7  --   GLUCOSE 57* 50*   No results for input(s): CKTOTAL, CKMB, TROPONINI in the last 72 hours. Lab Results  Component Value Date   CHOL 70 09/25/2015   HDL 22* 09/25/2015   LDLCALC 32 09/25/2015   TRIG 79 09/25/2015   Lab Results  Component Value Date   DDIMER  10/02/2009    <0.22        AT THE INHOUSE ESTABLISHED CUTOFF VALUE OF 0.48 ug/mL FEU, THIS ASSAY HAS BEEN DOCUMENTED IN THE LITERATURE TO HAVE A SENSITIVITY AND NEGATIVE PREDICTIVE VALUE OF AT LEAST 98 TO 99%.  THE TEST RESULT SHOULD BE CORRELATED WITH AN ASSESSMENT OF THE CLINICAL PROBABILITY OF DVT / VTE.    Radiology/Studies:  Dg Chest 2 View  10/05/2015  CLINICAL DATA:  Status post central catheter removal. Recent  coronary artery bypass grafting for coronary artery disease EXAM: CHEST  2 VIEW COMPARISON:  October 04, 2015 FINDINGS: The Cordis has been removed. Temporary pacemaker wires remain attached to the right heart. No demonstrable pneumothorax. There is patchy atelectasis in the left lower lobe and to a lesser extent in the right base. No new opacity. Heart is slightly enlarged with pulmonary vascularity within normal limits. No adenopathy. IMPRESSION: No pneumothorax. Mild bibasilar atelectasis, more on the left than on the right. No edema or consolidation. Stable cardiac prominence. Electronically Signed   By: Lowella Grip III M.D.   On: 10/05/2015 08:41   Dg Chest 2 View  10/01/2015  CLINICAL DATA:  Preoperative radiograph. EXAM: CHEST  2 VIEW COMPARISON:  09/24/2015 FINDINGS: Cardiomediastinal silhouette is normal. Mediastinal contours appear intact. There is no evidence of focal airspace consolidation, pleural effusion or pneumothorax. The aorta is torturous. Osseous structures are without acute abnormality. Soft tissues are grossly normal. IMPRESSION: No active cardiopulmonary disease. Electronically Signed   By: Fidela Salisbury M.D.   On: 10/01/2015 20:01   Dg Chest 2 View  09/24/2015  CLINICAL DATA:  Aching pain in the temporal region extending into the jaw. EXAM: CHEST  2 VIEW COMPARISON:  Radiographs 04/11/2014 and 10/02/2009. FINDINGS: The heart size and mediastinal contours are normal. The lungs are clear. There is no pleural effusion or pneumothorax. No acute osseous findings are identified. IMPRESSION: No active cardiopulmonary process. Electronically Signed   By: Richardean Sale M.D.   On: 09/24/2015 16:04   Dg Chest Port 1 View  10/04/2015  CLINICAL DATA:  Status post coronary artery bypass grafting. Hypertension. EXAM: PORTABLE CHEST 1 VIEW COMPARISON:  October 03, 2015 FINDINGS: Chest tube on the left and mediastinal drain have been removed. Swan-Ganz catheter has been removed with  Cordis tip remaining in the superior vena cava. Temporary pacemaker wires are attached to the right heart. There is no demonstrable pneumothorax. There is patchy atelectasis in both lung bases. Lungs elsewhere clear. Heart is mildly enlarged with pulmonary vascularity normal, stable. No adenopathy. IMPRESSION: No pneumothorax. Persistent bibasilar atelectatic change. Stable cardiac prominence. Electronically Signed   By: Lowella Grip III M.D.   On: 10/04/2015  07:53   Dg Chest Port 1 View  10/03/2015  CLINICAL DATA:  CABG EXAM: PORTABLE CHEST 1 VIEW COMPARISON:  10/02/2015 FINDINGS: Interval extubation and removal of NG tube. Swan-Ganz catheter and left chest tube remain in place, unchanged. No pneumothorax. Cardiomegaly with vascular congestion and bibasilar atelectasis. IMPRESSION: Interval extubation. Increasing bibasilar atelectasis. Cardiomegaly with vascular congestion. Electronically Signed   By: Rolm Baptise M.D.   On: 10/03/2015 08:15   Dg Chest Port 1 View  10/02/2015  CLINICAL DATA:  Postop CABG EXAM: PORTABLE CHEST 1 VIEW COMPARISON:  10/01/2015 FINDINGS: Left-sided chest tube in satisfactory position. No left pneumothorax. Small left pleural effusion and left basilar atelectasis. No right pleural effusion or pneumothorax. Interval CABG. Endotracheal tube with the tip 2 cm above the carina. Nasogastric tube coursing below the diaphragm. Swan-Ganz catheter with the tip at the right ventricular outflow tract. Stable heart size.  No acute osseous abnormality. IMPRESSION: 1. Support lines and tubing as described above. 2. Postsurgical changes status post CABG. Electronically Signed   By: Kathreen Devoid   On: 10/02/2015 14:43     EKG: NSR with inferolateral STEMI pattern, evolving T waves  CARDIAC STUDIES: pending  ASSESSMENT AND PLAN:  1. Inferolateral STEMI: timing somewhat unclear as no chest pain. Evolving T wave changes suggest subacute timing but he is at risk for silent infarct given  diabetes and recent CABG. Will take emergently for cardiac cath and possible PCI. Risks, indications reviewed with patient and wife. Further plans pending cath results. Heparin, ASA administered.  2. Nausea/vomiting: likely secondary to #1. Will hold amiodarone in case this is contributing. IV Fluid hydration.   3. Type II DM: cover with SSI while here. Hold metformin and oral agents.   4. HTN: beta-blocker/ARB. Follow labs  5. Hyperlipidemia: continue atorvastatin  Will notify Dr [name] of admission for tomorrow morning.   Deatra James MD 10/22/2015, 9:11 PM

## 2015-10-22 NOTE — Consult Note (Signed)
ANTICOAGULATION CONSULT NOTE - Initial Consult  Pharmacy Consult for heparin Indication: chest pain/ACS  Allergies  Allergen Reactions  . Actos [Pioglitazone]     Itching, rash   . Levaquin [Levofloxacin] Itching and Rash  . Lisinopril Itching and Rash    Patient Measurements: Height: 5\' 6"  (167.6 cm) Weight: 167 lb 15.9 oz (76.2 kg) IBW/kg (Calculated) : 63.8 Heparin Dosing Weight: 76.2 kg  Vital Signs: Temp: 98.6 F (37 C) (02/06 1743) Temp Source: Oral (02/06 1743) BP: 135/80 mmHg (02/06 1743) Pulse Rate: 86 (02/06 1743)  Labs:  Recent Labs  10/22/15 1816  HGB 13.3  HCT 39.0  CREATININE 1.40*    Estimated Creatinine Clearance: 43.7 mL/min (by C-G formula based on Cr of 1.4).   Assessment: 70 yo male s/p CABG 3 weeks ago who was sent to the ED after having K of 6.5 at PCP office. EKG concerning for ST elevation - pt sent to cath lab as Code STEMI.  Goal of Therapy:  Heparin level 0.3-0.7 units/ml Monitor platelets by anticoagulation protocol: Yes   Plan:  Give 4000 units bolus x 1 Start heparin infusion at 900 units/hr Check anti-Xa level in 8 hours and daily while on heparin Continue to monitor H&H and platelets  Joya San, PharmD Clinical Pharmacy Resident Pager # [phone] 10/22/2015 6:45 PM

## 2015-10-22 NOTE — ED Provider Notes (Signed)
CSN: [id number]     Arrival date & time 10/22/15  1731 History   First MD Initiated Contact with Patient 10/22/15 1800     Chief Complaint  Patient presents with  . Abnormal Lab     (Consider location/radiation/quality/duration/timing/severity/associated sxs/prior Treatment) HPI Comments: 70 year old male with extensive past medical history including recent CABG, type 2 diabetes mellitus, HTN who presents with fatigue. Patient had CABG 3 weeks ago and since then has had generalized fatigue, decreased appetite, and generalized malaise. He went to his PCP today for routine follow-up and labs showed potassium of 6.5; he was sent here for further evaluation. Wife reports that the patient has eaten very Fernando Hess because of persistent nausea not relieved with Zofran. He has had occasional vomiting but mostly nausea. He has also had nonbloody diarrhea. No abdominal pain. He has had some hot and cold symptoms but has never measured his temperature. Occasional cough at night. He denies any new chest pain; he has mild pain around his incision sites which has not changed recently. He denies any shortness of breath. No urinary symptoms.  The history is provided by the patient.    Past Medical History  Diagnosis Date  . Diabetes mellitus without complication ([place])   . Hypertension   . Hyperlipidemia   . Kidney stones   . CAD (coronary artery disease), native coronary artery 09/24/2015  . History of kidney stones    Past Surgical History  Procedure Laterality Date  . Cataract extraction, bilateral    . Leg surgery  70 yrs old    from fx. right  . Appendectomy    . Left heart catheterization with coronary angiogram N/A 04/13/2014    Procedure: LEFT HEART CATHETERIZATION WITH CORONARY ANGIOGRAM;  Surgeon: Jacolyn Reedy, MD;  Location: [facility] CATH LAB;  Service: Cardiovascular;  Laterality: N/A;  . Cardiac catheterization N/A 09/26/2015    Procedure: Left Heart Cath and Coronary Angiography;  Surgeon: Troy Sine, MD;  Location: [place] CV LAB;  Service: Cardiovascular;  Laterality: N/A;  . Coronary artery bypass graft N/A 10/02/2015    Procedure: CORONARY ARTERY BYPASS GRAFTING (CABG) X5 LIMA-LAD; SVG-DIAG; SVG-OM; SVG-PD; SVG-RAMUS TRANSESOPHAGEAL ECHOCARDIOGRAM (TEE) ENDOSCOPIC GREATER SAPHENOUS VEIN HARVEST BILAT LE;  Surgeon: Ivin Poot, MD;  Location: [place];  Service: Open Heart Surgery;  Laterality: N/A;  . Tee without cardioversion N/A 10/02/2015    Procedure: TRANSESOPHAGEAL ECHOCARDIOGRAM (TEE);  Surgeon: Ivin Poot, MD;  Location: [place];  Service: Open Heart Surgery;  Laterality: N/A;   Family History  Problem Relation Age of Onset  . Heart attack Mother   . Heart attack Father   . Diabetes Brother   . Pancreatic cancer Brother   . Colon cancer Neg Hx   . Esophageal cancer Neg Hx   . Prostate cancer Neg Hx   . Rectal cancer Neg Hx   . Stomach cancer Neg Hx   . Diabetes Brother    Social History  Substance Use Topics  . Smoking status: Never Smoker   . Smokeless tobacco: Never Used  . Alcohol Use: Yes     Comment: 1 beer/wine every 2-3 months per patient    Review of Systems 10 Systems reviewed and are negative for acute change except as noted in the HPI.    Allergies  Actos; Levaquin; and Lisinopril  Home Medications   Prior to Admission medications   Medication Sig Start Date End Date Taking? Authorizing Provider  amiodarone (PACERONE) 200 MG tablet Take  1 tablet (200 mg total) by mouth 2 (two) times daily. 10/08/15   John Giovanni, [name]  aspirin EC 325 MG EC tablet Take 1 tablet (325 mg total) by mouth daily. 10/08/15   Wayne E Gold, [name]  atorvastatin (LIPITOR) 40 MG tablet Take 40 mg by mouth daily.    Historical Provider, MD  furosemide (LASIX) 40 MG tablet Take 1 tablet (40 mg total) by mouth daily. 10/08/15   Wayne E Gold, [name]  glimepiride (AMARYL) 4 MG tablet Take 8 mg by mouth daily with breakfast. Takes 1 and 1/2 tablets by mouth daily with  breakfast    Historical Provider, MD  losartan (COZAAR) 25 MG tablet Take 1 tablet (25 mg total) by mouth daily. 10/08/15   Wayne E Gold, [name]  metFORMIN (GLUCOPHAGE) 500 MG tablet Take 2 tablets (1,000 mg total) by mouth 2 (two) times daily with a meal. Begin taking on Saturday morning 04/13/14   Jacolyn Reedy, MD  metoprolol succinate (TOPROL-XL) 25 MG 24 hr tablet Take 25 mg by mouth daily.    Historical Provider, MD  Multiple Vitamin (MULTIVITAMIN) tablet Take 1 tablet by mouth daily.    Historical Provider, MD  ondansetron (ZOFRAN-ODT) 4 MG disintegrating tablet Take 4 mg by mouth every 8 (eight) hours as needed for nausea or vomiting. Pt states he takes at home when he feels nauseous    Historical Provider, MD  ONE TOUCH ULTRA TEST test strip  10/31/13   Historical Provider, MD  oxyCODONE (OXY IR/ROXICODONE) 5 MG immediate release tablet Take 1-2 tablets (5-10 mg total) by mouth every 4 (four) hours as needed for severe pain. 10/08/15   Wayne E Gold, [name]  pantoprazole (PROTONIX) 40 MG tablet Take 40 mg by mouth daily.    Historical Provider, MD  potassium chloride SA (K-DUR,KLOR-CON) 20 MEQ tablet Take 1 tablet (20 mEq total) by mouth daily. 10/08/15   Wayne E Gold, [name]   BP 135/80 mmHg  Pulse 86  Temp(Src) 98.6 F (37 C) (Oral)  Resp 16  Ht 5\' 6"  (1.676 m)  Wt 167 lb 15.9 oz (76.2 kg)  BMI 27.13 kg/m2  SpO2 100% Physical Exam  Constitutional: He is oriented to person, place, and time. He appears well-developed and well-nourished.  Pale, uncomfortable  HENT:  Head: Normocephalic and atraumatic.  Dry mucous membranes  Eyes: Conjunctivae are normal. Pupils are equal, round, and reactive to light.  Neck: Neck supple.  Cardiovascular: Normal rate, regular rhythm and normal heart sounds.   No murmur heard. Midline sternotomy scar  Pulmonary/Chest: Effort normal and breath sounds normal.  Abdominal: Soft. Bowel sounds are normal. He exhibits no distension. There is no tenderness.   Musculoskeletal:  Tenderness right lower leg near her graft site, no drainage  Neurological: He is alert and oriented to person, place, and time.  Fluent speech  Skin: Skin is warm and dry. There is pallor.  Well-healing sternotomy scar with no drainage or surrounding erythema  Psychiatric: He has a normal mood and affect. Judgment normal.  Nursing note and vitals reviewed.   ED Course  .Critical Care Performed by: Fernando Hess Authorized by: Fernando Hess Total critical care time: 35 minutes Critical care was necessary to treat or prevent imminent or life-threatening deterioration of the following conditions: cardiac failure. Critical care was time spent personally by me on the following activities: development of treatment plan with patient or surrogate, discussions with consultants, evaluation of patient's response to treatment, examination of patient, obtaining  history from patient or surrogate, ordering and performing treatments and interventions, ordering and review of laboratory studies, re-evaluation of patient's condition and review of old charts.   (including critical care time) Labs Review Labs Reviewed  CBC - Abnormal; Notable for the following:    RBC 4.14 (*)    Hemoglobin 11.3 (*)    HCT 35.7 (*)    RDW 17.0 (*)    Platelets 804 (*)    All other components within normal limits  I-STAT CHEM 8, ED - Abnormal; Notable for the following:    BUN 35 (*)    Creatinine, Ser 1.40 (*)    Glucose, Bld 50 (*)    All other components within normal limits  BASIC METABOLIC PANEL  HEPARIN LEVEL (UNFRACTIONATED)  CBC  HEPARIN LEVEL (UNFRACTIONATED)  I-STAT TROPOININ, ED    Imaging Review No results found. I have personally reviewed and evaluated these  lab results as part of my medical decision-making.   EKG Interpretation   Date/Time:  Monday October 22 2015 17:50:30 EST Ventricular Rate:  85 PR Interval:  154 QRS Duration: 92 QT Interval:   354 QTC Calculation: 421 R Axis:   107 Text Interpretation:  Normal sinus rhythm with sinus arrhythmia Rightward  axis Cannot rule out Inferior infarct , age undetermined Abnormal ECG T  wave inversions and borderline ST elevation in II, III, aVF concerning for  ischemia Confirmed by Yousra Ivens MD, Larayne Baxley [phone]) on 10/22/2015 5:53:48 PM     Medications  heparin ADULT infusion 100 units/mL (25000 units/250 mL) (not administered)  heparin bolus via infusion 4,000 Units (not administered)  aspirin chewable tablet 324 mg (324 mg Oral Given 10/22/15 1824)  ondansetron (ZOFRAN) injection 4 mg (4 mg Intravenous Given 10/22/15 1825)    MDM   Final diagnoses:  ST elevation myocardial infarction (STEMI), unspecified artery ([name])    Pt presents from clinic with ongoing generalized fatigue, nausea, and decreased appetite since his CABG 3 weeks ago. He was sent in after PCP obtained labs notable for hyperkalemia. At triage, the patient had an EKG which I reviewed and was concerning for inferior ST elevation compared to his recent postsurgical EKG. I immediately instructed nursing staff to bring patient back. Given possible hyperkalemia confounding interpretation of the EKG, I contacted cardiology, Dr. Burt Knack. Appreciate his assistance with patient's care. He immediately saw the patient at bedside and after we obtained i-STAT results that showed potassium 5.1, called a code STEMI for new EKG changes. The patient was given 325 mg aspirin and 4000 unit heparin bolus as well as Zofran. He was immediately taken to the cardiac Cath Lab.  Fernando Iles, MD 10/22/15 [phone]

## 2015-10-22 NOTE — ED Notes (Signed)
Dr. Burt Knack at bedside at this time

## 2015-10-22 NOTE — Consult Note (Signed)
ANTICOAGULATION CONSULT NOTE - Initial Consult  Pharmacy Consult for heparin Indication: chest pain/ACS  Allergies  Allergen Reactions  . Actos [Pioglitazone]     Itching, rash   . Levaquin [Levofloxacin] Itching and Rash  . Lisinopril Itching and Rash    Patient Measurements: Height: 5\' 6"  (167.6 cm) Weight: 167 lb 15.9 oz (76.2 kg) IBW/kg (Calculated) : 63.8 Heparin Dosing Weight: 76.2 kg  Vital Signs: Temp: 98.6 F (37 C) (02/06 1743) Temp Source: Oral (02/06 1743) BP: 121/68 mmHg (02/06 1916) Pulse Rate: 108 (02/06 1926)  Labs:  Recent Labs  10/22/15 1748 10/22/15 1816  HGB 11.3* 13.3  HCT 35.7* 39.0  PLT 804*  --   CREATININE 1.60* 1.40*    Estimated Creatinine Clearance: 43.7 mL/min (by C-G formula based on Cr of 1.4).   Assessment: 70 yo male s/p CABG 3 weeks ago who was sent to the ED after having K of 6.5 at PCP office. EKG concerning for ST elevation - pt sent to cath lab as Code STEMI.  Sheath removed 1918 to begin heparin 8 hrs after removal  Goal of Therapy:  Heparin level 0.3-0.7 units/ml Monitor platelets by anticoagulation protocol: Yes   Plan:  Resume heparin 900 units/hr @ 0330 Initial HL 1000 Daily HL, CBC  Levester Fresh, PharmD, BCPS, [facility] Clinical Pharmacist Pager [phone] 10/22/2015 7:47 PM

## 2015-10-23 ENCOUNTER — Inpatient Hospital Stay (HOSPITAL_COMMUNITY): Payer: Medicare Other

## 2015-10-23 ENCOUNTER — Ambulatory Visit: Payer: Medicare Other | Admitting: *Deleted

## 2015-10-23 ENCOUNTER — Encounter (HOSPITAL_COMMUNITY): Payer: Self-pay | Admitting: Cardiovascular Disease

## 2015-10-23 LAB — LIPID PANEL
CHOLESTEROL: 81 mg/dL (ref 0–200)
HDL: 30 mg/dL — ABNORMAL LOW (ref >40)
LDL CALC: 31 mg/dL (ref 0–99)
Total CHOL/HDL Ratio: 2.7 RATIO
Triglycerides: 102 mg/dL (ref <150)
VLDL: 20 mg/dL (ref 0–40)

## 2015-10-23 LAB — BASIC METABOLIC PANEL
Anion gap: 11 (ref 5–15)
BUN: 28 mg/dL — AB (ref 6–20)
CHLORIDE: 106 mmol/L (ref 101–111)
CO2: 23 mmol/L (ref 22–32)
CREATININE: 1.34 mg/dL — AB (ref 0.61–1.24)
Calcium: 8.7 mg/dL — ABNORMAL LOW (ref 8.9–10.3)
GFR calc Af Amer: 60 mL/min — ABNORMAL LOW (ref >60)
GFR calc non Af Amer: 52 mL/min — ABNORMAL LOW (ref >60)
Glucose, Bld: 194 mg/dL — ABNORMAL HIGH (ref 65–99)
Potassium: 5 mmol/L (ref 3.5–5.1)
SODIUM: 140 mmol/L (ref 135–145)

## 2015-10-23 LAB — GLUCOSE, CAPILLARY
GLUCOSE-CAPILLARY: 211 mg/dL — AB (ref 65–99)
GLUCOSE-CAPILLARY: 205 mg/dL — AB (ref 65–99)
Glucose-Capillary: 53 mg/dL — ABNORMAL LOW (ref 65–99)
Glucose-Capillary: 96 mg/dL (ref 65–99)
Glucose-Capillary: 163 mg/dL — ABNORMAL HIGH (ref 65–99)
Glucose-Capillary: 210 mg/dL — ABNORMAL HIGH (ref 65–99)

## 2015-10-23 LAB — CBC
HCT: 30.3 % — ABNORMAL LOW (ref 39.0–52.0)
HCT: 30.7 % — ABNORMAL LOW (ref 39.0–52.0)
Hemoglobin: 10 g/dL — ABNORMAL LOW (ref 13.0–17.0)
Hemoglobin: 9.5 g/dL — ABNORMAL LOW (ref 13.0–17.0)
MCH: 28.1 pg (ref 26.0–34.0)
MCH: 26.9 pg (ref 26.0–34.0)
MCHC: 32.6 g/dL (ref 30.0–36.0)
MCHC: 31.4 g/dL (ref 30.0–36.0)
MCV: 86.2 fL (ref 78.0–100.0)
MCV: 85.8 fL (ref 78.0–100.0)
PLATELETS: 511 10*3/uL — AB (ref 150–400)
PLATELETS: 587 10*3/uL — AB (ref 150–400)
RBC: 3.53 MIL/uL — AB (ref 4.22–5.81)
RBC: 3.56 MIL/uL — AB (ref 4.22–5.81)
RDW: 17.1 % — ABNORMAL HIGH (ref 11.5–15.5)
RDW: 17.2 % — AB (ref 11.5–15.5)
WBC: 9 10*3/uL (ref 4.0–10.5)
WBC: 9.7 10*3/uL (ref 4.0–10.5)

## 2015-10-23 LAB — HEPARIN LEVEL (UNFRACTIONATED)
HEPARIN UNFRACTIONATED: 0.55 [IU]/mL (ref 0.30–0.70)
Heparin Unfractionated: <0.1 IU/mL — ABNORMAL LOW (ref 0.30–0.70)

## 2015-10-23 LAB — TROPONIN I
TROPONIN I: 0.05 ng/mL — AB (ref <0.031)
Troponin I: 0.06 ng/mL — ABNORMAL HIGH (ref <0.031)

## 2015-10-23 MED FILL — Nitroglycerin IV Soln 100 MCG/ML in D5W: INTRA_ARTERIAL | Qty: 10 | Status: AC

## 2015-10-23 MED FILL — Verapamil HCl IV Soln 2.5 MG/ML: INTRAVENOUS | Qty: 2 | Status: AC

## 2015-10-23 NOTE — Progress Notes (Signed)
  Echocardiogram 2D Echocardiogram has been performed.  Fernando Hess 10/23/2015, 3:52 PM

## 2015-10-23 NOTE — Progress Notes (Signed)
Inpatient Diabetes Program Recommendations  AACE/ADA: New Consensus Statement on Inpatient Glycemic Control (2015)  Target Ranges:  Prepandial:   less than 140 mg/dL      Peak postprandial:   less than 180 mg/dL (1-2 hours)      Critically ill patients:  140 - 180 mg/dL   Review of Glycemic Control  Diabetes history: dm 2 Outpatient Diabetes medications: Amaryl 8 mg and metformin 1000 Current orders for Inpatient glycemic control: moderate correction tidwc  Inpatient Diabetes Program Recommendations:    Noted hypoglycemia on admission-cbg was 96 mg/dL at HS yesterday. Fasting this am up to 211 mg/dl.  Patient may need low dose lantus while here as well as the correction ordered if fastings continue high. Will follow.    Thank you Rosita Kea, RN, MSN, CDE  Diabetes Inpatient Program Office: [phone] Pager: [phone] 8:00 am to 5:00 pm

## 2015-10-23 NOTE — Progress Notes (Signed)
Order to transfer acknowledged and verified. Rm [id number] available. Reprt called to [facility]. Pt informed of new room. Belongings packed. Pt transferred via bed with belongings. [place] notified. VVS.

## 2015-10-23 NOTE — Progress Notes (Signed)
Informed Dr. [name] pt's admission, Dr. [name] will round on Mr Jeanfreau today.   Hilbert Corrigan PA Pager: [phone]

## 2015-10-23 NOTE — Care Management Note (Signed)
Case Management Note  Patient Details  Name: Fernando Hess MRN: [id number] Date of Birth: 11/26/1945  Subjective/Objective:       Adm w mi             Action/Plan: lives w wife, pcp dr Lona Kettle   Expected Discharge Date:                  Expected Discharge Plan:     In-House Referral:     Discharge planning Services     Post Acute Care Choice:    Choice offered to:     DME Arranged:    DME Agency:     HH Arranged:    [place] Agency:     Status of Service:     Medicare Important Message Given:    Date Medicare IM Given:    Medicare IM give by:    Date Additional Medicare IM Given:    Additional Medicare Important Message give by:     If discussed at [place] of Stay Meetings, dates discussed:    Additional Comments: ur review done  Lacretia Leigh, RN 10/23/2015, 8:25 AM

## 2015-10-23 NOTE — Progress Notes (Signed)
Subjective:  Came in last night mainly with nausea failure to thrive and was found to be hyperkalemic but EKG showed possible acute inferior infarction.  Troponin was really not high.  Catheterization showed early graft failure of 2 of his grafts.  These were to the circumflex territory and the films were reviewed.  He has really not had any chest discomfort since he left the hospital and did have fairly typical angina prior to his bypass.  He is really distressed over severe marital problems at home and says that things have not been well and that is pretty much what he wants to talk about this morning.  No angina at the present time.  Objective:  Vital Signs in the last 24 hours: BP 112/60 mmHg  Pulse 84  Temp(Src) 98 F (36.7 C) (Oral)  Resp 20  Ht 5\' 6"  (1.676 m)  Wt 76.2 kg (167 lb 15.9 oz)  BMI 27.13 kg/m2  SpO2 94%  Physical Exam: Pale, depressed appearing male in no acute distress Lungs:  Clear Cardiac:  Regular rhythm, normal S1 and S2, no S3 Extremities:  No edema present, cath site clean and dry  Intake/Output from previous day: 02/06 0701 - 02/07 0700 In: 1189.5 [P.O.:360; I.V.:829.5] Out: 600 [Urine:600]  Weight Filed Weights   10/22/15 1809  Weight: 76.2 kg (167 lb 15.9 oz)    Lab Results: Basic Metabolic Panel:  Recent Labs  10/22/15 2216 10/23/15 0515  NA 138 140  K 5.0 5.0  CL 103 106  CO2 22 23  GLUCOSE 46* 194*  BUN 33* 28*  CREATININE 1.55* 1.34*   CBC:  Recent Labs  10/22/15 1748 10/22/15 1816 10/23/15 0515  WBC 10.4  --  9.0  HGB 11.3* 13.3 10.0*  HCT 35.7* 39.0 30.7*  MCV 86.2  --  86.2  PLT 804*  --  587*   Cardiac Enzymes: Troponin (Point of Care Test)  Recent Labs  10/22/15 1807  TROPIPOC 0.05   Telemetry: Sinus rhythm  Assessment/Plan:  1.  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  Coronary artery disease with patent LIMA to LAD, patent saphenous vein graft to RCA, patent saphenous vein graft  to diagonal 3.  Severe situational stress 4.  Chronic kidney disease stage III 5.  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  Significant nausea that may be due to amiodarone  Recommendations: Continue to cycle troponins.  EKG shows some inferior T-wave changes.  I will stop his amiodarone and will continue his Plavix.  Continue heparin for another 24 hours.  Check echocardiogram.  Discussed with Dr. Burt Knack and we'll initially try medical treatment.  If he has recurrent angina then we can consider PCI of the circumflex which would be a complex procedure.     Kerry Hough  MD [facility] Cardiology  10/23/2015, 8:34 AM

## 2015-10-23 NOTE — Consult Note (Signed)
ANTICOAGULATION CONSULT NOTE - Initial Consult  Pharmacy Consult for heparin Indication: chest pain/ACS  Allergies  Allergen Reactions  . Actos [Pioglitazone]     Itching, rash   . Levaquin [Levofloxacin] Itching and Rash  . Lisinopril Itching and Rash    Patient Measurements: Height: 5\' 6"  (167.6 cm) Weight: 167 lb 15.9 oz (76.2 kg) IBW/kg (Calculated) : 63.8 Heparin Dosing Weight: 76.2 kg  Vital Signs: Temp: 98.1 F (36.7 C) (02/07 1137) Temp Source: Oral (02/07 1137) BP: 108/70 mmHg (02/07 1411) Pulse Rate: 108 (02/07 1300)  Labs:  Recent Labs  10/22/15 1748 10/22/15 1816 10/22/15 2216 10/23/15 0515 10/23/15 1210  HGB 11.3* 13.3  --  10.0*  --   HCT 35.7* 39.0  --  30.7*  --   PLT 804*  --   --  587*  --   HEPARINUNFRC  --   --   --   --  <0.10*  CREATININE 1.60* 1.40* 1.55* 1.34*  --   TROPONINI  --   --   --   --  0.06*    Estimated Creatinine Clearance: 45.6 mL/min (by C-G formula based on Cr of 1.34).   Assessment: 70 yo male s/p CABG 3 weeks ago who was sent to the ED after having K of 6.5 at PCP office. EKG concerning for ST elevation - pt sent to cath lab as Code STEMI.  Initial HL following cath and restart is undetectable on heparin 900 units/hr. Nurse reports no issues with infusion or bleeding.  Goal of Therapy:  Heparin level 0.3-0.7 units/ml Monitor platelets by anticoagulation protocol: Yes   Plan:  Increase heparin to 1200 units/hr 8h HL Daily HL, CBC  Andrey Cota. Diona Foley, PharmD, [place] Clinical Pharmacist Pager [phone]  10/23/2015 2:36 PM

## 2015-10-23 NOTE — Plan of Care (Signed)
Problem: Safety: Goal: Ability to remain free from injury will improve Outcome: Progressing Pt ambulated 25 ft in room. No assistive devices. Bed alarm on. Instructed on how to use call bell and phone.  Pt complains of intermittent right sided leg pain. Relieved with medication. No complaints of pain currently. Pt eating 100% of meals.

## 2015-10-24 ENCOUNTER — Inpatient Hospital Stay (HOSPITAL_COMMUNITY): Payer: Medicare Other

## 2015-10-24 LAB — GLUCOSE, CAPILLARY
GLUCOSE-CAPILLARY: 213 mg/dL — AB (ref 65–99)
GLUCOSE-CAPILLARY: 114 mg/dL — AB (ref 65–99)
Glucose-Capillary: 174 mg/dL — ABNORMAL HIGH (ref 65–99)
Glucose-Capillary: 160 mg/dL — ABNORMAL HIGH (ref 65–99)

## 2015-10-24 LAB — HEMOGLOBIN A1C
Hgb A1c MFr Bld: 6.6 % — ABNORMAL HIGH (ref 4.8–5.6)
MEAN PLASMA GLUCOSE: 143 mg/dL

## 2015-10-24 LAB — TROPONIN I: Troponin I: 0.05 ng/mL — ABNORMAL HIGH (ref <0.031)

## 2015-10-24 NOTE — Progress Notes (Signed)
CARDIAC REHAB PHASE I   PRE:  Rate/Rhythm: 77 SR  BP:  Sitting: 114/61        SaO2: 99 RA  MODE:  Ambulation: 350 ft   POST:  Rate/Rhythm: 85 SR  BP:  Sitting: 141/77         SaO2: 95 RA  Pt in bed, agreeable to walk, spent much of conversation discussing his home situation/difficulties with his relationship with his wife. Pt ambulated 350 ft on RA, IV, assist x1, slow, fairly steady gait, tolerated well with no complaints. Pt states he has been depressed, states he "does not know what he is going to do when he goes home." When asked if he had any thoughts of harming himself, pt replied "yes." Pt informed of need to notify RN, pt states "can I resend my previous comment?." Pt states "I think I just need to speak with my pastor. I don't want to see a psychiatrist." Pt to recliner after walk. RN notified, charge RN aware, RN to place SW consult. Pt asked cardiac rehab to return to room, pt states "I was feeling bad when I home and I was hurting, but now I am getting well and feeling better. But I can't go home. I told the doctor I can't go home." Emotional support given to pt. Pt requested assistance to return to bed, call bell within reach. Will follow.   [id number]  Lenna Sciara, RN, BSN 10/24/2015 12:15 PM

## 2015-10-24 NOTE — Consult Note (Signed)
ANTICOAGULATION CONSULT NOTE - Follow-up Consult  Pharmacy Consult for heparin Indication: chest pain/ACS  Allergies  Allergen Reactions  . Actos [Pioglitazone]     Itching, rash   . Levaquin [Levofloxacin] Itching and Rash  . Lisinopril Itching and Rash    Patient Measurements: Height: 5\' 6"  (167.6 cm) Weight: 167 lb 15.9 oz (76.2 kg) IBW/kg (Calculated) : 63.8 Heparin Dosing Weight: 76.2 kg  Vital Signs: Temp: 99.1 F (37.3 C) (02/07 2008) Temp Source: Oral (02/07 2008) BP: 96/49 mmHg (02/07 2008) Pulse Rate: 64 (02/07 2008)  Labs:  Recent Labs  10/22/15 1748 10/22/15 1816 10/22/15 2216 10/23/15 0515 10/23/15 1210 10/23/15 1820 10/23/15 2330  HGB 11.3* 13.3  --  10.0*  --   --  9.5*  HCT 35.7* 39.0  --  30.7*  --   --  30.3*  PLT 804*  --   --  587*  --   --  511*  HEPARINUNFRC  --   --   --   --  <0.10*  --  0.55  CREATININE 1.60* 1.40* 1.55* 1.34*  --   --   --   TROPONINI  --   --   --   --  0.06* 0.05*  --     Estimated Creatinine Clearance: 45.6 mL/min (by C-G formula based on Cr of 1.34).   Assessment: 70 yo male s/p CABG 3 weeks ago who was sent to the ED after having K of 6.5 at PCP office. EKG concerning for ST elevation - pt sent to cath lab as Code STEMI. Cath showed early graft failure of 2 grafts. Plan to continue heparin for another 24 hours (until 2/8). Heparin level therapeutic on 1200 units/hr. Hgb down to 9.5, no overt bleeding noted. Plt elevated.  Goal of Therapy:  Heparin level 0.3-0.7 units/ml Monitor platelets by anticoagulation protocol: Yes   Plan:  Continue heparin at 1200 units/hr Daily HL, CBC F/u length of therapy with heparin  Sherlon Handing, PharmD, BCPS Clinical pharmacist, pager [phone] 10/24/2015 12:04 AM

## 2015-10-24 NOTE — Progress Notes (Signed)
Subjective:  He is feeling better today and has good appetite.  He has had no recurrent atrial fibrillation.  Amiodarone was stopped yesterday.  No ischemic chest pain and his enzymes do not show an acute infarction.  He again brought up the home and marital difficulties that he is having and this obviously is a major reason for the difficulties at home.  No anginal type chest pain.  Objective:  Vital Signs in the last 24 hours: BP 97/85 mmHg  Pulse 83  Temp(Src) 98.9 F (37.2 C) (Oral)  Resp 16  Ht 5\' 6"  (1.676 m)  Wt 69.536 kg (153 lb 4.8 oz)  BMI 24.75 kg/m2  SpO2 96%  Physical Exam: Pale, depressed appearing male in no acute distress Lungs:  Clear Cardiac:  Regular rhythm, normal S1 and S2, no S3 Extremities:  No edema present,   Intake/Output from previous day: 02/07 0701 - 02/08 0700 In: 1327.9 [P.O.:840; I.V.:487.9] Out: 750 [Urine:750]  Weight Filed Weights   10/22/15 1809 10/24/15 0424  Weight: 76.2 kg (167 lb 15.9 oz) 69.536 kg (153 lb 4.8 oz)    Lab Results: Basic Metabolic Panel:  Recent Labs  10/22/15 2216 10/23/15 0515  NA 138 140  K 5.0 5.0  CL 103 106  CO2 22 23  GLUCOSE 46* 194*  BUN 33* 28*  CREATININE 1.55* 1.34*   CBC:  Recent Labs  10/23/15 0515 10/23/15 2330  WBC 9.0 9.7  HGB 10.0* 9.5*  HCT 30.7* 30.3*  MCV 86.2 85.8  PLT 587* 511*   Cardiac Enzymes: Troponin (Point of Care Test)  Recent Labs  10/22/15 1807  TROPIPOC 0.05   Telemetry: Sinus rhythm  Assessment/Plan:  1.  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  Coronary artery disease with patent LIMA to LAD, patent saphenous vein graft to RCA, patent saphenous vein graft to diagonal 3.  Severe situational stress 4.  Chronic kidney disease stage III 5.  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  Significant nausea that may be due to amiodarone  Recommendations:  Discontinue heparin at this time.  We need to have  a Hotel manager.  The wife talked me last night and she seemed like everything was fine but obviously there is a great deal of things going on that doesn't appear apparent from the surface.  Ambulate with cardiac rehabilitation.  If stable overnight and discharged home off of amiodarone and on Plavix.  Kerry Hough  MD [facility] Cardiology  10/24/2015, 1:50 PM

## 2015-10-24 NOTE — Progress Notes (Signed)
2 Days Post-Op Procedure(s) (LRB): Left Heart Cath and Coronary Angiography (N/A) Subjective: No chest pain but patient depressed Cath and echo images reviewed Agree with medical therapy Poor targets would preclude further CABG Objective: Vital signs in last 24 hours: Temp:  [98.1 F (36.7 C)-99.1 F (37.3 C)] 98.9 F (37.2 C) (02/08 0424) Pulse Rate:  [64-108] 69 (02/08 0424) Cardiac Rhythm:  [-] Normal sinus rhythm (02/08 0100) Resp:  [12-20] 16 (02/08 0424) BP: (91-118)/(49-70) 118/58 mmHg (02/08 0424) SpO2:  [93 %-96 %] 96 % (02/08 0424) Weight:  [153 lb 4.8 oz (69.536 kg)] 153 lb 4.8 oz (69.536 kg) (02/08 0424)  Hemodynamic parameters for last 24 hours:  stable  Intake/Output from previous day: 02/07 0701 - 02/08 0700 In: 1317.9 [P.O.:840; I.V.:477.9] Out: 750 [Urine:750] Intake/Output this shift:         Exam         Incisions clean and dry- no edema    General- alert and comfortable   Lungs- clear without rales, wheezes   Cor- regular rate and rhythm, no murmur , gallop   Abdomen- soft, non-tender   Extremities - warm, non-tender, minimal edema   Neuro- oriented, appropriate, no focal weakness   Lab Results:  Recent Labs  10/23/15 0515 10/23/15 2330  WBC 9.0 9.7  HGB 10.0* 9.5*  HCT 30.7* 30.3*  PLT 587* 511*   BMET:  Recent Labs  10/22/15 2216 10/23/15 0515  NA 138 140  K 5.0 5.0  CL 103 106  CO2 22 23  GLUCOSE 46* 194*  BUN 33* 28*  CREATININE 1.55* 1.34*  CALCIUM 9.1 8.7*    PT/INR: No results for input(s): LABPROT, INR in the last 72 hours. ABG    Component Value Date/Time   PHART 7.434 10/03/2015 0746   HCO3 24.3* 10/03/2015 0746   TCO2 23 10/22/2015 1816   ACIDBASEDEF 2.0 10/03/2015 0456   O2SAT 95.0 10/03/2015 0746   CBG (last 3)   Recent Labs  10/23/15 1137 10/23/15 1606 10/23/15 2007  GLUCAP 205* 163* 210*    Assessment/Plan: S/P Procedure(s) (LRB): Left Heart Cath and Coronary Angiography (N/A) Will followup  in office as scheduled   LOS: 2 days    Fernando Hess 10/24/2015

## 2015-10-25 DIAGNOSIS — I25810 Atherosclerosis of coronary artery bypass graft(s) without angina pectoris: Secondary | ICD-10-CM

## 2015-10-25 LAB — CBC
HEMATOCRIT: 29 % — AB (ref 39.0–52.0)
Hemoglobin: 9.4 g/dL — ABNORMAL LOW (ref 13.0–17.0)
MCH: 27.9 pg (ref 26.0–34.0)
MCHC: 32.4 g/dL (ref 30.0–36.0)
MCV: 86.1 fL (ref 78.0–100.0)
PLATELETS: 422 10*3/uL — AB (ref 150–400)
RBC: 3.37 MIL/uL — ABNORMAL LOW (ref 4.22–5.81)
RDW: 17 % — AB (ref 11.5–15.5)
WBC: 8.7 10*3/uL (ref 4.0–10.5)

## 2015-10-25 LAB — GLUCOSE, CAPILLARY
GLUCOSE-CAPILLARY: 145 mg/dL — AB (ref 65–99)
GLUCOSE-CAPILLARY: 176 mg/dL — AB (ref 65–99)

## 2015-10-25 MED ORDER — ASPIRIN 81 MG PO CHEW: 81.0000 mg | CHEWABLE_TABLET | Freq: Every day | ORAL | Status: DC

## 2015-10-25 MED ORDER — CLOPIDOGREL BISULFATE 75 MG PO TABS: 75.0000 mg | ORAL_TABLET | Freq: Every day | ORAL | Status: DC

## 2015-10-25 MED ORDER — NITROGLYCERIN 0.4 MG SL SUBL: 0.4000 mg | SUBLINGUAL_TABLET | SUBLINGUAL | Status: DC | PRN

## 2015-10-25 NOTE — Discharge Summary (Signed)
Physician Discharge Summary  Patient ID: Fernando Hess MRN: [id number] DOB/AGE: 01-04-1946 70 y.o.  Admit date: 10/22/2015 Discharge date: 10/25/2015  Primary Physician:  Dr. Myriam Jacobson  Primary Discharge Diagnosis:  1.  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  Severe nausea due to amiodarone 3.  Anemia due to blood loss due to recent surgery which is slowly improving 4.  Coronary artery disease native vessel with coronary bypass grafting 5.  Diabetes mellitus with nephropathy 6.  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  Situational depression and marital stress 8.  Hyperlipidemia under treatment  Procedures:  Echocardiogram, cardiac catheterization  Hospital Course: This 70 year old male had bypass grafting earlier in January.  He has had significant marital stress at home and had done poorly at home with nausea and vomiting and significant malaise.  He was having difficulty holding food or fluids down.  He was seen the day of admission in the [place] walk-in clinic and was found to have hyperkalemia and sent to the emergency room.  He denied chest pain or pressure or dyspnea.  His major complaints are generalized weakness lethargy and nausea.  An EKG showed a possible acute inferior infarction with slight ST elevation inferiorly although initial troponins were not significantly elevated.  He was taken emergently to the cardiac catheterization laboratory.  He was found to have a patent mammary graft to LAD, a patent vein graft to the diagonal and a patent vein graft to the distal right coronary artery.  The saphenous vein graft to the marginal branch as well as the intermediate branch were felt to be occluded.  He also was noted to have occlusion of the distal second marginal branch.  He was found to have diffuse diabetic vessels with a patent intermediate branch  but with severe stenosis of 70-80% in the intermediate branch, proximal 60% circumflex and mild first marginal stenosis.  Dr. Burt Knack felt he he will be managed medically initially.  If he has recurrent angina despite medical treatment he could be considered for a complex intervention of the circumflex system.  Serial enzymes were cycled and troponin remained trivially elevated not consistent with an acute infarction.  It was apparent on discussion with the patient that he was mostly nauseated and his amiodarone, potassium and furosemide were discontinued.  He also was felt to be significantly depressed and stressed out about his marital situation and home situation.  He was ambulatory in the hall with cardiac rehabilitation and did not have any significant chest discomfort.  At this point in time it would be managed medically.  Dr. Nils Pyle did not feel he was a good candidate for repeat surgery.  He complained of minimal chest wall soreness and will be discharged in improved condition today on clopidogrel.  His aspirin dose was reduced and he was continued on atorvastatin.  He will have renal function followed up after his discharge and he will be sent home on a reduced amount of medication.  It was thought that the nausea was due to amiodarone and was markedly improved on discharge.  Repeat echo EF of 55-60%.  Telemetry did not show any atrial fibrillation.  He will be seen by home health on discharge and attention should be made to have further discussions about possible depression and the significant marital problems.  He complains of.    Discharge Exam: Blood pressure 108/52, pulse  62, temperature 98.8 F (37.1 C), temperature source Oral, resp. rate 14, height 5\' 6"  (1.676 m), weight 70.308 kg (155 lb), SpO2 95 %. Weight: 70.308 kg (155 lb)   lungs clear, no S3   Labs: CBC:   Lab Results  Component Value Date   WBC 8.7 10/25/2015   HGB 9.4* 10/25/2015   HCT 29.0* 10/25/2015   MCV 86.1  10/25/2015   PLT 422* 10/25/2015    CMP:   Recent Labs Lab 10/22/15 2216 10/23/15 0515  NA 138 140  K 5.0 5.0  CL 103 106  CO2 22 23  BUN 33* 28*  CREATININE 1.55* 1.34*  CALCIUM 9.1 8.7*  PROT 5.5*  --   BILITOT 0.8  --   ALKPHOS 100  --   ALT 16*  --   AST 20  --   GLUCOSE 46* 194*   Lipid Panel     Component Value Date/Time   CHOL 81 10/23/2015 0515   TRIG 102 10/23/2015 0515   HDL 30* 10/23/2015 0515   CHOLHDL 2.7 10/23/2015 0515   VLDL 20 10/23/2015 0515   LDLCALC 31 10/23/2015 0515   Cardiac Enzymes:  Recent Labs  10/23/15 1210 10/23/15 1820 10/23/15 2330  TROPONINI 0.06* 0.05* 0.05*   Thyroid: Lab Results  Component Value Date   TSH 0.468 10/22/2015   Hemoglobin A1C: Lab Results  Component Value Date   HGBA1C 6.6* 10/22/2015    Radiology: Atelectasis in the left base, questionable small left effusion  EKG: Sinus rhythm, left axis deviation, changes in inferior leads have resolved  Discharge Medications:   Medication List    STOP taking these medications        amiodarone 200 MG tablet  Commonly known as:  PACERONE     aspirin 325 MG EC tablet  Replaced by:  aspirin 81 MG chewable tablet     furosemide 40 MG tablet  Commonly known as:  LASIX     potassium chloride SA 20 MEQ tablet  Commonly known as:  K-DUR,KLOR-CON      TAKE these medications        aspirin 81 MG chewable tablet  Chew 1 tablet (81 mg total) by mouth daily.     atorvastatin 40 MG tablet  Commonly known as:  LIPITOR  Take 40 mg by mouth daily.     clopidogrel 75 MG tablet  Commonly known as:  PLAVIX  Take 1 tablet (75 mg total) by mouth daily with breakfast.     glimepiride 4 MG tablet  Commonly known as:  AMARYL  Take 8 mg by mouth daily with breakfast.        metFORMIN 500 MG tablet  Commonly known as:  GLUCOPHAGE  Take 2 tablets (1,000 mg total) by mouth 2 (two) times daily with a meal. Begin taking on Saturday morning     metoprolol succinate  25 MG 24 hr tablet  Commonly known as:  TOPROL-XL  Take 25 mg by mouth daily.     multivitamin tablet  Take 1 tablet by mouth daily.     nitroGLYCERIN 0.4 MG SL tablet  Commonly known as:  NITROSTAT  Place 1 tablet (0.4 mg total) under the tongue every 5 (five) minutes x 3 doses as needed for chest pain.     ondansetron 4 MG disintegrating tablet  Commonly known as:  ZOFRAN-ODT  Take 4 mg by mouth every 8 (eight) hours as needed for nausea or vomiting. Pt states he takes at home when  he feels nauseous     oxyCODONE 5 MG immediate release tablet  Commonly known as:  Oxy IR/ROXICODONE  Take 1-2 tablets (5-10 mg total) by mouth every 4 (four) hours as needed for severe pain.     pantoprazole 40 MG tablet  Commonly known as:  PROTONIX  Take 40 mg by mouth daily.        Followup plans and appointments: Follow-up with Dr. [name] as well as Dr. Nils Pyle in one week  Time spent with patient to include physician time: 30 minutes  Signed: W. Doristine Church. MD [facility] 10/25/2015, 9:23 AM

## 2015-10-25 NOTE — Care Management Note (Signed)
Case Management Note  Patient Details  Name: Fernando Hess MRN: [id number] Date of Birth: 06-29-1946  Subjective/Objective:  Pt admitted for Chest Pain.                   Action/Plan: Plan for d/c home today with [place]. CM did offer choice and pt chose [facility] for services. Referral was made and SOC to begin within 24-48 hours post d/c. No further needs from CM at this time.    Expected Discharge Date:                  Expected Discharge Plan:  [place]  In-House Referral:  NA  Discharge planning Services  CM Consult  Post Acute Care Choice:  Home Health Choice offered to:  Patient  DME Arranged:  N/A DME Agency:  NA  HH Arranged:  RN [place] Agency:  [place]  Status of Service:  Completed, signed off  Medicare Important Message Given:  Yes Date Medicare IM Given:    Medicare IM give by:    Date Additional Medicare IM Given:    Additional Medicare Important Message give by:     If discussed at [place] of Stay Meetings, dates discussed:    Additional Comments:  Bethena Roys, RN 10/25/2015, 10:42 AM

## 2015-10-25 NOTE — Progress Notes (Signed)
CARDIAC REHAB PHASE I   PRE:  Rate/Rhythm: 38 SR  BP:  Sitting: 129/75        SaO2: 97 RA  MODE:  Ambulation: 550 ft   POST:  Rate/Rhythm: 85 SR  BP:  Sitting: 143/72         SaO2: 98 RA  Pt states he is feeling better this morning than yesterday, he states he has spoken with Dr. [name] and his pastor regarding his home situation, states he will go home for now and is considering alternative long term options. Pt daughter at bedside. Pt ambulated 550 ft on RA, handheld assist, steady gait, tolerated well.  Pt denies cp, dizziness, DOE, declined rest stop. Completed MI education. Reviewed risk factors, anti-platelet therapy, activity restrictions, ntg, exercise, heart healthy diet, carb counting, daily weights, sodium restrictions and phase 2 cardiac rehab. Pt verbalized understanding. Pt agrees to phase 2 cardiac rehab referral, will send to [facility]. Pt to bed per pt request after walk, call bell within reach.   [phone]  Lenna Sciara, RN, BSN 10/25/2015 11:32 AM

## 2015-10-25 NOTE — Care Management Important Message (Signed)
Important Message  Patient Details  Name: Fernando Hess MRN: [id number] Date of Birth: [birth year]   Medicare Important Message Given:  Yes    Loann Quill 10/25/2015, 8:30 AM

## 2015-10-29 ENCOUNTER — Other Ambulatory Visit: Payer: Self-pay | Admitting: *Deleted

## 2015-10-29 DIAGNOSIS — Z951 Presence of aortocoronary bypass graft: Secondary | ICD-10-CM

## 2015-10-31 ENCOUNTER — Encounter: Payer: Self-pay | Admitting: Cardiothoracic Surgery

## 2015-10-31 ENCOUNTER — Ambulatory Visit
Admission: RE | Admit: 2015-10-31 | Discharge: 2015-10-31 | Disposition: A | Discharge status: Home / Self Care | Payer: Medicare Other | Source: Ambulatory Visit | Attending: Cardiothoracic Surgery | Admitting: Cardiothoracic Surgery

## 2015-10-31 ENCOUNTER — Ambulatory Visit (INDEPENDENT_AMBULATORY_CARE_PROVIDER_SITE_OTHER): Payer: Self-pay | Admitting: Cardiothoracic Surgery

## 2015-10-31 VITALS — BP 113/77 | HR 74 | Resp 16 | Ht 66.0 in | Wt 153.0 lb

## 2015-10-31 DIAGNOSIS — Z951 Presence of aortocoronary bypass graft: Secondary | ICD-10-CM

## 2015-10-31 NOTE — Progress Notes (Signed)
PCP is  Melinda Crutch, MD Referring Provider is Jacolyn Reedy, MD  Chief Complaint  Patient presents with  . Routine Post Op    f/u from surgery with CXR s/p Coronary artery bypass grafting x5 10/02/15...decreased appetite, saw Dr. Gus Height today and Remeron ordered    HPI: One month followup after multivessel CABG for severe three-vessel CAD. Patient is a diabetic nonsmoker and had poor targets for grafting. He initially did well after surgery and was discharged home in sinus rhythm. He returned with recurrent chest pain and positive enzymes and repeat cath showed graft closure  to some small circumflex branch. No intervention was needed. The patient recovered uneventfully and his echo showed preserved LV systolic function. He was placed on Plavix. He had patent grafts  Left IMA to LAD, saphenous vein graft to RCA and saphenous main graft to ramus.  The patient has had a slow recover. He remains weak. He has nausea headache and lack of appetite. He is placed on \\Remeron  by his primary care physician.he is in sinus rhythm. He appears to be ready to start driving. Right now he has home RN and a home physical therapist. His weight has decreased to 150 pounds from 160 preop.   Chest x-ray today is clear with out pleural effusion. Past Medical History  Diagnosis Date  . Diabetes mellitus without complication ([place])   . Hypertension   . Hyperlipidemia   . Kidney stones   . CAD (coronary artery disease), native coronary artery 09/24/2015  . History of kidney stones   . ST elevation myocardial infarction (STEMI) of inferior wall ([place]) 10/22/2015    Past Surgical History  Procedure Laterality Date  . Cataract extraction, bilateral    . Leg surgery  70 yrs old    from fx. right  . Appendectomy    . Left heart catheterization with coronary angiogram N/A 04/13/2014    Procedure: LEFT HEART CATHETERIZATION WITH CORONARY ANGIOGRAM;  Surgeon: Jacolyn Reedy, MD;  Location: [facility] CATH LAB;  Service:  Cardiovascular;  Laterality: N/A;  . Cardiac catheterization N/A 09/26/2015    Procedure: Left Heart Cath and Coronary Angiography;  Surgeon: Troy Sine, MD;  Location: [place] CV LAB;  Service: Cardiovascular;  Laterality: N/A;  . Coronary artery bypass graft N/A 10/02/2015    Procedure: CORONARY ARTERY BYPASS GRAFTING (CABG) X5 LIMA-LAD; SVG-DIAG; SVG-OM; SVG-PD; SVG-RAMUS TRANSESOPHAGEAL ECHOCARDIOGRAM (TEE) ENDOSCOPIC GREATER SAPHENOUS VEIN HARVEST BILAT LE;  Surgeon: Ivin Poot, MD;  Location: [place];  Service: Open Heart Surgery;  Laterality: N/A;  . Tee without cardioversion N/A 10/02/2015    Procedure: TRANSESOPHAGEAL ECHOCARDIOGRAM (TEE);  Surgeon: Ivin Poot, MD;  Location: [place];  Service: Open Heart Surgery;  Laterality: N/A;  . Cardiac catheterization N/A 10/22/2015    Procedure: Left Heart Cath and Coronary Angiography;  Surgeon: Sherren Mocha, MD;  Location: [place] CV LAB;  Service: Cardiovascular;  Laterality: N/A;    Family History  Problem Relation Age of Onset  . Heart attack Mother   . Heart attack Father   . Diabetes Brother   . Pancreatic cancer Brother   . Colon cancer Neg Hx   . Esophageal cancer Neg Hx   . Prostate cancer Neg Hx   . Rectal cancer Neg Hx   . Stomach cancer Neg Hx   . Diabetes Brother     Social History Social History  Substance Use Topics  . Smoking status: Never Smoker   . Smokeless tobacco: Never Used  .  Alcohol Use: Yes     Comment: 1 beer/wine every 2-3 months per patient    Current Outpatient Prescriptions  Medication Sig Dispense Refill  . aspirin 81 MG chewable tablet Chew 1 tablet (81 mg total) by mouth daily.    [name] atorvastatin (LIPITOR) 40 MG tablet Take 40 mg by mouth daily.    . clopidogrel (PLAVIX) 75 MG tablet Take 1 tablet (75 mg total) by mouth daily with breakfast. 30 tablet 12  . glimepiride (AMARYL) 4 MG tablet Take 4 mg by mouth daily with breakfast.     . metFORMIN (GLUCOPHAGE) 500 MG tablet Take 2  tablets (1,000 mg total) by mouth 2 (two) times daily with a meal. Begin taking on Saturday morning    . metoprolol succinate (TOPROL-XL) 25 MG 24 hr tablet Take 25 mg by mouth daily.    . mirtazapine (REMERON) 15 MG tablet Take 15 mg by mouth at bedtime.    . Multiple Vitamin (MULTIVITAMIN) tablet Take 1 tablet by mouth daily.    . nitroGLYCERIN (NITROSTAT) 0.4 MG SL tablet Place 1 tablet (0.4 mg total) under the tongue every 5 (five) minutes x 3 doses as needed for chest pain. 25 tablet 12  . ondansetron (ZOFRAN-ODT) 4 MG disintegrating tablet Take 4 mg by mouth every 8 (eight) hours as needed for nausea or vomiting. Pt states he takes at home when he feels nauseous    . oxyCODONE (OXY IR/ROXICODONE) 5 MG immediate release tablet Take 1-2 tablets (5-10 mg total) by mouth every 4 (four) hours as needed for severe pain. 50 tablet 0  . pantoprazole (PROTONIX) 40 MG tablet Take 40 mg by mouth daily.     No current facility-administered medications for this visit.    Allergies  Allergen Reactions  . Actos [Pioglitazone]     Itching, rash   . Levaquin [Levofloxacin] Itching and Rash  . Lisinopril Itching and Rash    Review of Systems  Patient appears somewhat despondent Surgical incisions are well-healed. He complains of some erythema from the right leg saphenous vein site.  BP 113/77 mmHg  Pulse 74  Resp 16  Ht 5\' 6"  (1.676 m)  Wt 153 lb (69.4 kg)  BMI 24.71 kg/m2  SpO2 96% Physical Exam Alert and comfortable Lungs clear Heart rate regular without murmur or gallop No pedal edema Sternal incision well-healed Right leg incision clean with peroxide and saline and a Neosporin Band-Aid dressing placed  Diagnostic Tests: Chest x-ray clear  Impression: Slow recovery after multivessel CABG Patient still needs home health support with nurse and physical therapist. He can start driving after March 1 and will be referred to cardiac rehabilitation. Continue current medications No  lifting more than 15 pounds until April 1.  Plan:return for followup visit in 6 weeks to assess and monitor progress   Len Childs, MD [facility] [phone]

## 2015-11-01 ENCOUNTER — Encounter (HOSPITAL_COMMUNITY): Payer: Self-pay | Admitting: *Deleted

## 2015-11-01 ENCOUNTER — Emergency Department (HOSPITAL_COMMUNITY): Payer: Medicare Other

## 2015-11-01 ENCOUNTER — Encounter: Payer: Self-pay | Admitting: *Deleted

## 2015-11-01 ENCOUNTER — Emergency Department (HOSPITAL_COMMUNITY)
Admission: EM | Admit: 2015-11-01 | Discharge: 2015-11-01 | Disposition: A | Discharge status: Home / Self Care | Payer: Medicare Other | Attending: Emergency Medicine | Admitting: Emergency Medicine

## 2015-11-01 DIAGNOSIS — I2510 Atherosclerotic heart disease of native coronary artery without angina pectoris: Secondary | ICD-10-CM | POA: Diagnosis not present

## 2015-11-01 DIAGNOSIS — I10 Essential (primary) hypertension: Secondary | ICD-10-CM | POA: Diagnosis not present

## 2015-11-01 DIAGNOSIS — E119 Type 2 diabetes mellitus without complications: Secondary | ICD-10-CM | POA: Diagnosis not present

## 2015-11-01 DIAGNOSIS — Z79899 Other long term (current) drug therapy: Secondary | ICD-10-CM | POA: Insufficient documentation

## 2015-11-01 DIAGNOSIS — I252 Old myocardial infarction: Secondary | ICD-10-CM | POA: Insufficient documentation

## 2015-11-01 DIAGNOSIS — Z7902 Long term (current) use of antithrombotics/antiplatelets: Secondary | ICD-10-CM | POA: Diagnosis not present

## 2015-11-01 DIAGNOSIS — Y9289 Other specified places as the place of occurrence of the external cause: Secondary | ICD-10-CM | POA: Insufficient documentation

## 2015-11-01 DIAGNOSIS — R110 Nausea: Secondary | ICD-10-CM | POA: Diagnosis not present

## 2015-11-01 DIAGNOSIS — Y998 Other external cause status: Secondary | ICD-10-CM | POA: Diagnosis not present

## 2015-11-01 DIAGNOSIS — S8000XA Contusion of unspecified knee, initial encounter: Secondary | ICD-10-CM

## 2015-11-01 DIAGNOSIS — E785 Hyperlipidemia, unspecified: Secondary | ICD-10-CM | POA: Insufficient documentation

## 2015-11-01 DIAGNOSIS — W19XXXA Unspecified fall, initial encounter: Secondary | ICD-10-CM

## 2015-11-01 DIAGNOSIS — Z87442 Personal history of urinary calculi: Secondary | ICD-10-CM | POA: Diagnosis not present

## 2015-11-01 DIAGNOSIS — Z9889 Other specified postprocedural states: Secondary | ICD-10-CM | POA: Diagnosis not present

## 2015-11-01 DIAGNOSIS — S0993XA Unspecified injury of face, initial encounter: Secondary | ICD-10-CM | POA: Diagnosis present

## 2015-11-01 DIAGNOSIS — S3992XA Unspecified injury of lower back, initial encounter: Secondary | ICD-10-CM | POA: Insufficient documentation

## 2015-11-01 DIAGNOSIS — Z951 Presence of aortocoronary bypass graft: Secondary | ICD-10-CM | POA: Diagnosis not present

## 2015-11-01 DIAGNOSIS — S4992XA Unspecified injury of left shoulder and upper arm, initial encounter: Secondary | ICD-10-CM | POA: Diagnosis not present

## 2015-11-01 DIAGNOSIS — Z7984 Long term (current) use of oral hypoglycemic drugs: Secondary | ICD-10-CM | POA: Diagnosis not present

## 2015-11-01 DIAGNOSIS — S8001XA Contusion of right knee, initial encounter: Secondary | ICD-10-CM | POA: Diagnosis not present

## 2015-11-01 DIAGNOSIS — D849 Immunodeficiency, unspecified: Secondary | ICD-10-CM | POA: Insufficient documentation

## 2015-11-01 DIAGNOSIS — Z23 Encounter for immunization: Secondary | ICD-10-CM | POA: Insufficient documentation

## 2015-11-01 DIAGNOSIS — Y9301 Activity, walking, marching and hiking: Secondary | ICD-10-CM | POA: Insufficient documentation

## 2015-11-01 DIAGNOSIS — S0083XA Contusion of other part of head, initial encounter: Secondary | ICD-10-CM | POA: Insufficient documentation

## 2015-11-01 DIAGNOSIS — S79911A Unspecified injury of right hip, initial encounter: Secondary | ICD-10-CM | POA: Insufficient documentation

## 2015-11-01 DIAGNOSIS — S00511A Abrasion of lip, initial encounter: Secondary | ICD-10-CM | POA: Diagnosis not present

## 2015-11-01 DIAGNOSIS — S4991XA Unspecified injury of right shoulder and upper arm, initial encounter: Secondary | ICD-10-CM | POA: Diagnosis not present

## 2015-11-01 DIAGNOSIS — S79912A Unspecified injury of left hip, initial encounter: Secondary | ICD-10-CM | POA: Diagnosis not present

## 2015-11-01 DIAGNOSIS — Z7982 Long term (current) use of aspirin: Secondary | ICD-10-CM | POA: Diagnosis not present

## 2015-11-01 DIAGNOSIS — S8002XA Contusion of left knee, initial encounter: Secondary | ICD-10-CM | POA: Diagnosis not present

## 2015-11-01 DIAGNOSIS — W01198A Fall on same level from slipping, tripping and stumbling with subsequent striking against other object, initial encounter: Secondary | ICD-10-CM | POA: Insufficient documentation

## 2015-11-01 DIAGNOSIS — S0081XA Abrasion of other part of head, initial encounter: Secondary | ICD-10-CM

## 2015-11-01 DIAGNOSIS — M25562 Pain in left knee: Secondary | ICD-10-CM

## 2015-11-01 DIAGNOSIS — M25561 Pain in right knee: Secondary | ICD-10-CM

## 2015-11-01 MED ORDER — OXYCODONE HCL 5 MG PO TABS
5.0000 mg | ORAL_TABLET | Freq: Once | ORAL | Status: AC
  Administered 2015-11-01: 5 mg via ORAL
  Filled 2015-11-01: qty 1

## 2015-11-01 MED ORDER — ONDANSETRON 4 MG PO TBDP
8.0000 mg | ORAL_TABLET | Freq: Once | ORAL | Status: AC
  Administered 2015-11-01: 8 mg via ORAL
  Filled 2015-11-01: qty 2

## 2015-11-01 MED ORDER — TETANUS-DIPHTH-ACELL PERTUSSIS 5-2.5-18.5 LF-MCG/0.5 IM SUSP
0.5000 mL | Freq: Once | INTRAMUSCULAR | Status: AC
  Administered 2015-11-01: 0.5 mL via INTRAMUSCULAR
  Filled 2015-11-01: qty 0.5

## 2015-11-01 NOTE — ED Notes (Addendum)
Pt was walking when he tripped on a curb. Pt noted to have abrasion to upper lip. Abrasions to knees. Denies LOC no neuro deficits with EMS. Pt denies any dizziness/lightheadedness prior to fall.

## 2015-11-01 NOTE — ED Notes (Signed)
Bruising and surgical scars noted to chest. Tenderness to palpation on rt side that pt states is baseline. No obvious injuries to chest wall.

## 2015-11-01 NOTE — ED Provider Notes (Signed)
CSN: [id number]     Arrival date & time 11/01/15  1634 History  By signing my name below, I, Fernando Hess, attest that this documentation has been prepared under the direction and in the presence of [address], [employer]. Electronically Signed: Eustaquio Hess, ED Scribe. 11/01/2015. 5:12 PM.   Chief Complaint  Patient presents with  . Fall   Patient is a 70 y.o. male presenting with fall. The history is provided by the patient, the spouse and medical records. No language interpreter was used.  Fall This is a new problem. The current episode started less than 1 hour ago. The problem occurs rarely. The problem has been gradually improving. Pertinent negatives include no chest pain, no abdominal pain, no headaches and no shortness of breath. Exacerbated by: movement. Nothing relieves the symptoms. He has tried nothing for the symptoms. The treatment provided no relief.     HPI Comments: Fernando Hess is a 70 y.o. male brought in by ambulance, with a PMHx of DM2, HTN, HLD, CAD, and recent CABG on 10/02/15, who presents to the Emergency Department complaining of sudden onset, constant, 6/10, sharp, nonradiating upper lip pain s/p ground level fall that occurred approximately 45 minutes ago. Pt reports that he tripped on a curb and fell straight forward, landing onto his face and causing an abrasion to the upper lip. No LOC. Pt states he is able to close his mouth and feels like his teeth line up appropriately, no malocclusion or dental loosening/injury. His upper lip pain is exacerbated with movement of his lips. Pt also complains of abrasions and pain to the bilateral knees, bilateral hip pain, bilateral arm pain, and upper back pain. Pt has not ambulated since the fall. He reports that he felt mildly lightheaded after the fall that has since resolved on its own. He has not taken any medications for his pain. Pt did have a CABG on 10/02/2015 (approximately 1 month ago) and was on his way to follow up with  his cardiologist when he fell. Denies neck pain, chest pain, visual change, HA, shortness of breath, abdominal pain, vomiting, diarrhea, constipation, urinary or bowel incontinence, saddle anesthesia/cauda equina symptoms, numbness, tingling, weakness, or any other associated symptoms. Pt is currently on Plavix and ASA. Tetanus status unknown. Additionally he states he has daily nausea for which he takes zofran, has not taken it today. Denies presyncopal sensations, states the fall was a mechanical fall.    Past Medical History  Diagnosis Date  . Diabetes mellitus without complication ([place])   . Hypertension   . Hyperlipidemia   . Kidney stones   . CAD (coronary artery disease), native coronary artery 09/24/2015  . History of kidney stones   . ST elevation myocardial infarction (STEMI) of inferior wall ([place]) 10/22/2015   Past Surgical History  Procedure Laterality Date  . Cataract extraction, bilateral    . Leg surgery  70 yrs old    from fx. right  . Appendectomy    . Left heart catheterization with coronary angiogram N/A 04/13/2014    Procedure: LEFT HEART CATHETERIZATION WITH CORONARY ANGIOGRAM;  Surgeon: Jacolyn Reedy, MD;  Location: [facility] CATH LAB;  Service: Cardiovascular;  Laterality: N/A;  . Cardiac catheterization N/A 09/26/2015    Procedure: Left Heart Cath and Coronary Angiography;  Surgeon: Troy Sine, MD;  Location: [place] CV LAB;  Service: Cardiovascular;  Laterality: N/A;  . Coronary artery bypass graft N/A 10/02/2015    Procedure: CORONARY ARTERY BYPASS GRAFTING (CABG) X5 LIMA-LAD;  SVG-DIAG; SVG-OM; SVG-PD; SVG-RAMUS TRANSESOPHAGEAL ECHOCARDIOGRAM (TEE) ENDOSCOPIC GREATER SAPHENOUS VEIN HARVEST BILAT LE;  Surgeon: Ivin Poot, MD;  Location: [place];  Service: Open Heart Surgery;  Laterality: N/A;  . Tee without cardioversion N/A 10/02/2015    Procedure: TRANSESOPHAGEAL ECHOCARDIOGRAM (TEE);  Surgeon: Ivin Poot, MD;  Location: [place];  Service: Open Heart Surgery;   Laterality: N/A;  . Cardiac catheterization N/A 10/22/2015    Procedure: Left Heart Cath and Coronary Angiography;  Surgeon: Sherren Mocha, MD;  Location: [place] CV LAB;  Service: Cardiovascular;  Laterality: N/A;   Family History  Problem Relation Age of Onset  . Heart attack Mother   . Heart attack Father   . Diabetes Brother   . Pancreatic cancer Brother   . Colon cancer Neg Hx   . Esophageal cancer Neg Hx   . Prostate cancer Neg Hx   . Rectal cancer Neg Hx   . Stomach cancer Neg Hx   . Diabetes Brother    Social History  Substance Use Topics  . Smoking status: Never Smoker   . Smokeless tobacco: Never Used  . Alcohol Use: Yes     Comment: 1 beer/wine every 2-3 months per patient    Review of Systems  HENT: Positive for facial swelling (upper lip). Negative for dental problem and trouble swallowing.   Eyes: Negative for visual disturbance.  Respiratory: Negative for shortness of breath.   Cardiovascular: Negative for chest pain.  Gastrointestinal: Positive for nausea (daily chronic issue). Negative for vomiting, abdominal pain, diarrhea and constipation.       Negative for bowel incontinence  Genitourinary: Negative for difficulty urinating (no incontinence).  Musculoskeletal: Positive for back pain and arthralgias (Bilateral knees. Bilateral arms. Bilateral hips. ). Negative for neck pain.  Skin: Positive for wound (Abrasion to upper lip.).  Allergic/Immunologic: Positive for immunocompromised state (diabetic).  Neurological: Positive for light-headedness (improved). Negative for syncope, weakness, numbness and headaches.       Negative for saddle anesthesia  Hematological: Bruises/bleeds easily (on plavix and ASA).  Psychiatric/Behavioral: Negative for confusion.  10 Systems reviewed and all are negative for acute change except as noted in the HPI.   Allergies  Actos; Levaquin; and Lisinopril  Home Medications   Prior to Admission medications   Medication Sig  Start Date End Date Taking? Authorizing Provider  aspirin 81 MG chewable tablet Chew 1 tablet (81 mg total) by mouth daily. 10/25/15   Jacolyn Reedy, MD  atorvastatin (LIPITOR) 40 MG tablet Take 40 mg by mouth daily.    Historical Provider, MD  clopidogrel (PLAVIX) 75 MG tablet Take 1 tablet (75 mg total) by mouth daily with breakfast. 10/25/15   Jacolyn Reedy, MD  glimepiride (AMARYL) 4 MG tablet Take 4 mg by mouth daily with breakfast.     Historical Provider, MD  metFORMIN (GLUCOPHAGE) 500 MG tablet Take 2 tablets (1,000 mg total) by mouth 2 (two) times daily with a meal. Begin taking on Saturday morning 04/13/14   Jacolyn Reedy, MD  metoprolol succinate (TOPROL-XL) 25 MG 24 hr tablet Take 25 mg by mouth daily.    Historical Provider, MD  mirtazapine (REMERON) 15 MG tablet Take 15 mg by mouth at bedtime.    Historical Provider, MD  Multiple Vitamin (MULTIVITAMIN) tablet Take 1 tablet by mouth daily.    Historical Provider, MD  nitroGLYCERIN (NITROSTAT) 0.4 MG SL tablet Place 1 tablet (0.4 mg total) under the tongue every 5 (five) minutes x 3 doses  as needed for chest pain. 10/25/15   Jacolyn Reedy, MD  ondansetron (ZOFRAN-ODT) 4 MG disintegrating tablet Take 4 mg by mouth every 8 (eight) hours as needed for nausea or vomiting. Pt states he takes at home when he feels nauseous    Historical Provider, MD  oxyCODONE (OXY IR/ROXICODONE) 5 MG immediate release tablet Take 1-2 tablets (5-10 mg total) by mouth every 4 (four) hours as needed for severe pain. 10/08/15   Wayne E Gold, [name]  pantoprazole (PROTONIX) 40 MG tablet Take 40 mg by mouth daily.    Historical Provider, MD   BP 138/81 mmHg  Pulse 83  Temp(Src) 98.6 F (37 C) (Oral)  Resp 16  SpO2 97%   Physical Exam  Constitutional: He is oriented to person, place, and time. Vital signs are normal. He appears well-developed and well-nourished.  Non-toxic appearance. No distress.  Afebrile, nontoxic, NAD  HENT:  Head: Normocephalic. Head  is with abrasion and with contusion. Head is without raccoon's eyes and without Battle's sign.  Mouth/Throat: Uvula is midline, oropharynx is clear and moist and mucous membranes are normal. No trismus in the jaw. Normal dentition. No uvula swelling or lacerations.  Abrasion to upper lip with bruising and swelling noted to the mucosa of the inner upper lip, no dental injury or malocclusion, no trismus. No scalp or facial TTP, with no crepitus or bony step offs. No raccoon eyes or battle sign.  SEE PICTURE BELOW.  Eyes: Conjunctivae and EOM are normal. Pupils are equal, round, and reactive to light. Right eye exhibits no discharge. Left eye exhibits no discharge.  PERRL, EOMI, no nystagmus, no visual field deficits   Neck: Normal range of motion. Neck supple. No spinous process tenderness and no muscular tenderness present. No rigidity. Normal range of motion present.  FROM intact without spinous process TTP, no bony stepoffs or deformities, no paraspinous muscle TTP or muscle spasms. No rigidity or meningeal signs. No bruising or swelling.   Cardiovascular: Normal rate, regular rhythm, normal heart sounds and intact distal pulses.  Exam reveals no gallop and no friction rub.   No murmur heard. Pulmonary/Chest: Effort normal and breath sounds normal. No respiratory distress. He has no decreased breath sounds. He has no wheezes. He has no rhonchi. He has no rales. He exhibits no tenderness, no crepitus, no deformity and no retraction.  Midline chest scar which is well healed, several healing old bruises noted to chest wall which are at baseline per patient, no focal bony TTP, no crepitus or retractions, no deformities.   Abdominal: Soft. Normal appearance and bowel sounds are normal. He exhibits no distension. There is no tenderness. There is no rigidity, no rebound, no guarding, no CVA tenderness, no tenderness at McBurney's point and negative Murphy's sign.  Musculoskeletal: Normal range of motion.    MAE x4 Strength and sensation grossly intact Distal pulses intact Gait steady Bilateral knees with small bruises to the patella, with no focal TTP, no swelling or deformity, no crepitus, with FROM intact.   Neurological: He is alert and oriented to person, place, and time. He has normal strength. No cranial nerve deficit or sensory deficit. Coordination and gait normal. GCS eye subscore is 4. GCS verbal subscore is 5. GCS motor subscore is 6.  CN 2-12 grossly intact A&O x4 GCS 15 Sensation and strength intact Gait nonataxic including with tandem walking Coordination with finger-to-nose WNL Neg pronator drift   Skin: Skin is warm and dry. Abrasion noted. No rash noted.  Abrasion to upper lip as noted above and pictured below  Psychiatric: He has a normal mood and affect.  Nursing note and vitals reviewed.     ED Course  Procedures (including critical care time)  DIAGNOSTIC STUDIES: Oxygen Saturation is 97% on RA, normal by my interpretation.    COORDINATION OF CARE: 4:59 PM-Discussed treatment plan which includes CT Head with pt at bedside and pt agreed to plan.   Labs Review Labs Reviewed - No data to display  Imaging Review Dg Chest 2 View  10/31/2015  CLINICAL DATA:  Post CABG 10/02/2015, weakness, some shortness of breath, hypertension, diabetes mellitus, prior MI EXAM: CHEST  2 VIEW COMPARISON:  10/24/2015 FINDINGS: Upper normal heart size post CABG. Tortuous aorta. Mediastinal contours and pulmonary vascularity normal. Mild residual subsegmental atelectasis LEFT lower lobe, decreased since previous exam. Remaining lungs clear. Question tiny LEFT pleural effusion. No pneumothorax or acute osseous findings. IMPRESSION: Post CABG with minimal residual atelectasis and questionable tiny pleural effusion at LEFT lung base. When compared to previous exam, improved aeration at LEFT lung base is seen. Electronically Signed   By: Lavonia Dana M.D.   On: 10/31/2015 14:47   Ct Head Wo  Contrast  11/01/2015  CLINICAL DATA:  70 year old male status post fall onto face EXAM: CT HEAD WITHOUT CONTRAST CT MAXILLOFACIAL WITHOUT CONTRAST TECHNIQUE: Multidetector CT imaging of the head and maxillofacial structures were performed using the standard protocol without intravenous contrast. Multiplanar CT image reconstructions of the maxillofacial structures were also generated. COMPARISON:  Prior brain MRI 09/01/2013 FINDINGS: CT HEAD FINDINGS Negative for acute intracranial hemorrhage, acute infarction, mass, mass effect, hydrocephalus or midline shift. Gray-white differentiation is preserved throughout. Cerebral and cerebellar atrophy. Mild ex vacuo dilatation secondary to central atrophy. Periventricular white matter hypoattenuation most consistent with chronic microvascular ischemic white matter disease. No focal scalp hematoma or evidence of calvarial fracture. Globes and orbits are intact and unremarkable. Mucous retention cyst medial wall right maxillary sinus. Otherwise, normal aeration of the mastoid air cells and visualized paranasal sinuses. Intracranial atherosclerosis of the bilateral cavernous carotid arteries. CT MAXILLOFACIAL FINDINGS No focal soft tissue hematoma. No acute facial fracture. Mucous retention cysts in the inferior and medial aspects of the right maxillary sinus. The globes and orbits are intact and symmetric bilaterally. The visualized cervical spine is unremarkable. No evidence of fracture. IMPRESSION: CT HEAD 1. No acute intracranial abnormality. 2. 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 3. Intracranial atherosclerosis. CT FACE 1. No evidence of acute fracture or significant soft tissue injury. 2. Mucous retention cysts in the right maxillary sinus noted incidentally. Electronically Signed   By: Jacqulynn Cadet M.D.   On: 11/01/2015 19:39   Ct Maxillofacial Wo Cm  11/01/2015  CLINICAL DATA:  70 year old male status post fall onto face EXAM: CT  HEAD WITHOUT CONTRAST CT MAXILLOFACIAL WITHOUT CONTRAST TECHNIQUE: Multidetector CT imaging of the head and maxillofacial structures were performed using the standard protocol without intravenous contrast. Multiplanar CT image reconstructions of the maxillofacial structures were also generated. COMPARISON:  Prior brain MRI 09/01/2013 FINDINGS: CT HEAD FINDINGS Negative for acute intracranial hemorrhage, acute infarction, mass, mass effect, hydrocephalus or midline shift. Gray-white differentiation is preserved throughout. Cerebral and cerebellar atrophy. Mild ex vacuo dilatation secondary to central atrophy. Periventricular white matter hypoattenuation most consistent with chronic microvascular ischemic white matter disease. No focal scalp hematoma or evidence of calvarial fracture. Globes and orbits are intact and unremarkable. Mucous retention cyst medial wall right maxillary sinus. Otherwise, normal  aeration of the mastoid air cells and visualized paranasal sinuses. Intracranial atherosclerosis of the bilateral cavernous carotid arteries. CT MAXILLOFACIAL FINDINGS No focal soft tissue hematoma. No acute facial fracture. Mucous retention cysts in the inferior and medial aspects of the right maxillary sinus. The globes and orbits are intact and symmetric bilaterally. The visualized cervical spine is unremarkable. No evidence of fracture. IMPRESSION: CT HEAD 1. No acute intracranial abnormality. 2. 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 3. Intracranial atherosclerosis. CT FACE 1. No evidence of acute fracture or significant soft tissue injury. 2. Mucous retention cysts in the right maxillary sinus noted incidentally. Electronically Signed   By: Jacqulynn Cadet M.D.   On: 11/01/2015 19:39   I have personally reviewed and evaluated these images and lab results as part of my medical decision-making.   EKG Interpretation None      MDM   Final diagnoses:  Facial contusion, initial  encounter  Facial abrasion, initial encounter  Fall, initial encounter  Knee pain, bilateral  Contusion, knee, unspecified laterality, initial encounter    70 y.o. male here with mechanical fall today, striking his face and knees. Abrasion to upper lip with bruising to the lip, no dentitia injury or malocclusion. No facial or scalp tenderness, no focal neuro deficits, but pt felt lightheaded initially after the fall which resolved. No presyncopal symptoms. Pt on plavix and ASA. Due to this, will obtain CT head/face to ensure no other injury or intracranial bleeding. Will give pain meds. Pt states he has chronic nausea, will give zofran now. Will update tetanus. No knee tenderness, doubt need for xray imaging. Will reassess shortly  7:58 PM CT imaging neg. Discussed wound care for abrasions and hematoma of upper lip. Tylenol/motrin for pain, ice use discussed. Home pain meds and zofran discussed. F/up with PCP in 3-5 days for recheck. Discussed staying hydrated.  I explained the diagnosis and have given explicit precautions to return to the ER including for any other new or worsening symptoms. The patient understands and accepts the medical plan as it's been dictated and I have answered their questions. Discharge instructions concerning home care and prescriptions have been given. The patient is STABLE and is discharged to home in good condition.   I personally performed the services described in this documentation, which was scribed in my presence. The recorded information has been reviewed and is accurate.   BP 106/58 mmHg  Pulse 72  Temp(Src) 98.6 F (37 C) (Oral)  Resp 16  SpO2 95%  Meds ordered this encounter  Medications  . Tdap (BOOSTRIX) injection 0.5 mL    Sig:   . ondansetron (ZOFRAN-ODT) disintegrating tablet 8 mg    Sig:   . oxyCODONE (Oxy IR/ROXICODONE) immediate release tablet 5 mg    Sig:      Finnlee Silvernail Camprubi-Soms, [name] 11/01/15 1959  Ezequiel Essex, MD 11/01/15  2230

## 2015-11-01 NOTE — ED Notes (Signed)
Patient transported to CT 

## 2015-11-01 NOTE — Discharge Instructions (Signed)
Keep wound clean with mild soap and water. Keep area covered with a topical antibiotic ointment and bandage. Apply Ice and elevate any areas of pain in your body for additional pain and swelling relief . Alternate between Ibuprofen and Tylenol for additional pain relief. You may use your home oxycodone if necessary for pain, but don't drive or operate machinery while taking that medication. Follow up with your primary care doctor in approximately 3-5 days for wound recheck and ongoing management of symptoms. Stay well hydrated. Use your home zofran as needed for nausea. Monitor abrasion areas for signs of infection to include, but not limited to: increasing pain, spreading redness, drainage/pus, worsening swelling, or fevers. Return to emergency department for emergent changing or worsening symptoms.    Facial or Scalp Contusion  A facial or scalp contusion is a deep bruise on the face or head. Contusions happen when an injury causes bleeding under the skin. Signs of bruising include pain, puffiness (swelling), and discolored skin. The contusion may turn blue, purple, or yellow. HOME CARE  Only take medicines as told by your doctor.  Put ice on the injured area.  Put ice in a plastic bag.  Place a towel between your skin and the bag.  Leave the ice on for 20 minutes, 2-3 times a day. GET HELP IF:  You have bite problems.  You have pain when chewing.  You are worried about your face not healing normally. GET HELP RIGHT AWAY IF:   You have severe pain or a headache and medicine does not help.  You are very tired or confused, or your personality changes.  You throw up (vomit).  You have a nosebleed that will not stop.  You see two of everything (double vision) or have blurry vision.  You have fluid coming from your nose or ear.  You have problems walking or using your arms or legs. MAKE SURE YOU:   Understand these instructions.  Will watch your condition.  Will get help  right away if you are not doing well or get worse.   This information is not intended to replace advice given to you by your health care provider. Make sure you discuss any questions you have with your health care provider.   Document Released: 08/21/2011 Document Revised: 09/22/2014 Document Reviewed: 04/14/2013 Elsevier Interactive Patient Education 2016 Elsevier Inc[employer]  Hematoma A hematoma is a collection of blood. The collection of blood can turn into a hard, painful lump under the skin. Your skin may turn blue or yellow if the hematoma is close to the surface of the skin. Most hematomas get better in a few days to weeks. Some hematomas are serious and need medical care. Hematomas can be very small or very big. HOME CARE  Apply ice to the injured area:  Put ice in a plastic bag.  Place a towel between your skin and the bag.  Leave the ice on for 20 minutes, 2-3 times a day for the first 1 to 2 days.  After the first 2 days, switch to using warm packs on the injured area.  Raise (elevate) the injured area to lessen pain and puffiness (swelling). You may also wrap the area with an elastic bandage. Make sure the bandage is not wrapped too tight.  If you have a painful hematoma on your leg or foot, you may use crutches for a couple days.  Only take medicines as told by your doctor. GET HELP RIGHT AWAY IF:   Your pain gets  worse.  Your pain is not controlled with medicine.  You have a fever.  Your puffiness gets worse.  Your skin turns more blue or yellow.  Your skin over the hematoma breaks or starts bleeding.  Your hematoma is in your chest or belly (abdomen) and you are short of breath, feel weak, or have a change in consciousness.  Your hematoma is on your scalp and you have a headache that gets worse or a change in alertness or consciousness. MAKE SURE YOU:   Understand these instructions.  Will watch your condition.  Will get help right away if you are not doing  well or get worse.   This information is not intended to replace advice given to you by your health care provider. Make sure you discuss any questions you have with your health care provider.   Document Released: 10/09/2004 Document Revised: 05/04/2013 Document Reviewed: 02/09/2013 Elsevier Interactive Patient Education 2016 [place] Taking care of your wound properly can help to prevent pain and infection. It can also help your wound to heal more quickly.  HOW TO CARE FOR YOUR WOUND  Take or apply over-the-counter and prescription medicines only as told by your health care provider.  If you were prescribed antibiotic medicine, take or apply it as told by your health care provider. Do not stop using the antibiotic even if your condition improves.  Clean the wound each day or as told by your health care provider.  Wash the wound with mild soap and water.  Rinse the wound with water to remove all soap.  Pat the wound dry with a clean towel. Do not rub it.  There are many different ways to close and cover a wound. For example, a wound can be covered with stitches (sutures), skin glue, or adhesive strips. Follow instructions from your health care provider about:  How to take care of your wound.  When and how you should change your bandage (dressing).  When you should remove your dressing.  Removing whatever was used to close your wound.  Check your wound every day for signs of infection. Watch for:  Redness, swelling, or pain.  Fluid, blood, or pus.  Keep the dressing dry until your health care provider says it can be removed. Do not take baths, swim, use a hot tub, or do anything that would put your wound underwater until your health care provider approves.  Raise (elevate) the injured area above the level of your heart while you are sitting or lying down.  Do not scratch or pick at the wound.  Keep all follow-up visits as told by your health care  provider. This is important. SEEK MEDICAL CARE IF:  You received a tetanus shot and you have swelling, severe pain, redness, or bleeding at the injection site.  You have a fever.  Your pain is not controlled with medicine.  You have increased redness, swelling, or pain at the site of your wound.  You have fluid, blood, or pus coming from your wound.  You notice a bad smell coming from your wound or your dressing. SEEK IMMEDIATE MEDICAL CARE IF:  You have a red streak going away from your wound.   This information is not intended to replace advice given to you by your health care provider. Make sure you discuss any questions you have with your health care provider.   Document Released: 06/10/2008 Document Revised: 01/16/2015 Document Reviewed: 08/28/2014 Elsevier Interactive Patient Education 2016 [employer].  Cryotherapy  Cryotherapy means treatment with cold. Ice or gel packs can be used to reduce both pain and swelling. Ice is the most helpful within the first 24 to 48 hours after an injury or flare-up from overusing a muscle or joint. Sprains, strains, spasms, burning pain, shooting pain, and aches can all be eased with ice. Ice can also be used when recovering from surgery. Ice is effective, has very few side effects, and is safe for most people to use. PRECAUTIONS  Ice is not a safe treatment option for people with:  Raynaud phenomenon. This is a condition affecting small blood vessels in the extremities. Exposure to cold may cause your problems to return.  Cold hypersensitivity. There are many forms of cold hypersensitivity, including:  Cold urticaria. Red, itchy hives appear on the skin when the tissues begin to warm after being iced.  Cold erythema. This is a red, itchy rash caused by exposure to cold.  Cold hemoglobinuria. Red blood cells break down when the tissues begin to warm after being iced. The hemoglobin that carry oxygen are passed into the urine because they  cannot combine with blood proteins fast enough.  Numbness or altered sensitivity in the area being iced. If you have any of the following conditions, do not use ice until you have discussed cryotherapy with your caregiver:  Heart conditions, such as arrhythmia, angina, or chronic heart disease.  High blood pressure.  Healing wounds or open skin in the area being iced.  Current infections.  Rheumatoid arthritis.  Poor circulation.  Diabetes. Ice slows the blood flow in the region it is applied. This is beneficial when trying to stop inflamed tissues from spreading irritating chemicals to surrounding tissues. However, if you expose your skin to cold temperatures for too long or without the proper protection, you can damage your skin or nerves. Watch for signs of skin damage due to cold. HOME CARE INSTRUCTIONS Follow these tips to use ice and cold packs safely.  Place a dry or damp towel between the ice and skin. A damp towel will cool the skin more quickly, so you may need to shorten the time that the ice is used.  For a more rapid response, add gentle compression to the ice.  Ice for no more than 10 to 20 minutes at a time. The bonier the area you are icing, the less time it will take to get the benefits of ice.  Check your skin after 5 minutes to make sure there are no signs of a poor response to cold or skin damage.  Rest 20 minutes or more between uses.  Once your skin is numb, you can end your treatment. You can test numbness by very lightly touching your skin. The touch should be so light that you do not see the skin dimple from the pressure of your fingertip. When using ice, most people will feel these normal sensations in this order: cold, burning, aching, and numbness.  Do not use ice on someone who cannot communicate their responses to pain, such as small children or people with dementia. HOW TO MAKE AN ICE PACK Ice packs are the most common way to use ice therapy. Other  methods include ice massage, ice baths, and cryosprays. Muscle creams that cause a cold, tingly feeling do not offer the same benefits that ice offers and should not be used as a substitute unless recommended by your caregiver. To make an ice pack, do one of the following:  Place crushed ice or a  bag of frozen vegetables in a sealable plastic bag. Squeeze out the excess air. Place this bag inside another plastic bag. Slide the bag into a pillowcase or place a damp towel between your skin and the bag.  Mix 3 parts water with 1 part rubbing alcohol. Freeze the mixture in a sealable plastic bag. When you remove the mixture from the freezer, it will be slushy. Squeeze out the excess air. Place this bag inside another plastic bag. Slide the bag into a pillowcase or place a damp towel between your skin and the bag. SEEK MEDICAL CARE IF:  You develop white spots on your skin. This may give the skin a blotchy (mottled) appearance.  Your skin turns blue or pale.  Your skin becomes waxy or hard.  Your swelling gets worse. MAKE SURE YOU:   Understand these instructions.  Will watch your condition.  Will get help right away if you are not doing well or get worse.   This information is not intended to replace advice given to you by your health care provider. Make sure you discuss any questions you have with your health care provider.   Document Released: 04/28/2011 Document Revised: 09/22/2014 Document Reviewed: 04/28/2011 Elsevier Interactive Patient Education 2016 [employer].  Abrasion An abrasion is a cut or scrape on the surface of your skin. An abrasion does not go through all of the layers of your skin. It is important to take good care of your abrasion to prevent infection. HOME CARE Medicines  Take or apply medicines only as told by your doctor.  If you were prescribed an antibiotic ointment, finish all of it even if you start to feel better. Wound Care  Clean the wound with mild  soap and water 2-3 times per day or as told by your doctor. Pat your wound dry with a clean towel. Do not rub it.  There are many ways to close and cover a wound. Follow instructions from your doctor about:  How to take care of your wound.  When and how you should change your bandage (dressing).  When and how you should take off your dressing.  Check your wound every day for signs of infection. Watch for:  Redness, swelling, or pain.  Fluid, blood, or pus. General Instructions  Keep the dressing dry as told by your doctor. Do not take baths, swim, use a hot tub, or do anything that would put your wound underwater until your doctor says it is okay.  If there is swelling, raise (elevate) the injured area above the level of your heart while you are sitting or lying down.  Keep all follow-up visits as told by your doctor. This is important. GET HELP IF:  You were given a tetanus shot and you have any of these where the needle went in:  Swelling.  Very bad pain.  Redness.  Bleeding.  Medicine does not help your pain.  You have any of these at the site of the wound:  More redness.  More swelling.  More pain. GET HELP RIGHT AWAY IF:  You have a red streak going away from your wound.  You have a fever.  You have fluid, blood, or pus coming from your wound.  There is a bad smell coming from your wound.   This information is not intended to replace advice given to you by your health care provider. Make sure you discuss any questions you have with your health care provider.   Document Released: 02/18/2008 Document  Revised: 01/16/2015 Document Reviewed: 08/30/2014 Elsevier Interactive Patient Education [employer].

## 2015-11-01 NOTE — Progress Notes (Signed)
Patient ID: Fernando Hess, male   DOB: February 20, 1946, 70 y.o.   MRN: [id number]                                     A referral was faxed to [facility] to contact [name] to begin Phase II after March 1 per Dr. Lucianne Lei Trigt'S request.

## 2015-11-27 ENCOUNTER — Telehealth (HOSPITAL_COMMUNITY): Payer: Self-pay | Admitting: *Deleted

## 2015-11-27 NOTE — Telephone Encounter (Signed)
-----   Message from Jacolyn Reedy, MD sent at 11/27/2015  4:19 PM EDT ----- Regarding: RE: cardiac rehab NO he may go ahead and start ----- Message -----    From: Magda Kiel, RN    Sent: 11/27/2015   4:09 PM      To: Jacolyn Reedy, MD Subject: cardiac rehab                                  Good afternoon Dr [name],  I want to check with you if Mr Baringer needs to have a GXT before he begins exercise at cardiac rehab?  Thank you for your input Dr [name]  Sincerely,  Barnet Pall RN

## 2015-11-29 ENCOUNTER — Encounter: Payer: Medicare Other | Attending: Family Medicine | Admitting: *Deleted

## 2015-11-29 ENCOUNTER — Encounter: Payer: Self-pay | Admitting: *Deleted

## 2015-11-29 VITALS — Ht 66.0 in | Wt 158.1 lb

## 2015-11-29 DIAGNOSIS — E119 Type 2 diabetes mellitus without complications: Secondary | ICD-10-CM

## 2015-11-29 NOTE — Patient Instructions (Addendum)
We discussed Carb counting more today and we have agreed for you to: Aim for 2-3 Carb Choices per meal (30-45 grams) +/- 1 either way  Aim for 0-15 Carbs per snack if hungry  Include protein in moderation with your meals and snacks Consider reading food labels for Total Carbohydrate of foods continue your activity level by walking daily as tolerated Consider checking BG at alternate times per day to include before breakfast and two hour after a meal as directed by MD  Continue taking medication as directed by MD

## 2015-11-29 NOTE — Progress Notes (Signed)
Diabetes Self-Management Education  Visit Type:  Follow-up  Appt. Start Time: 1400 Appt. End Time: 1500  11/29/2015  [name], identified by name and date of birth, is a 70 y.o. male with a diagnosis of Diabetes: Type 2.   ASSESSMENT  Height 5\' 6"  (1.676 m), weight 158 lb 1.6 oz (71.714 kg). Body mass index is 25.53 kg/(m^2).       Diabetes Self-Management Education - 11/29/15 1544    Psychosocial Assessment   Patient Belief/Attitude about Diabetes Motivated to manage diabetes   Self-care barriers None   Self-management support Doctor's office;Family;CDE visits   Patient Concerns Nutrition/Meal planning   Special Needs Simplified materials   Learning Readiness Change in progress   Complications   Last HgB A1C per patient/outside source 6.6 %   How often do you check your blood sugar? 1-2 times/day   Fasting Blood glucose range (mg/dL) 180-200   Postprandial Blood glucose range (mg/dL) 180-200   Number of hypoglycemic episodes per month 0   How many days per week are you checking your feet? 1   Exercise   Exercise Type Light (walking / raking leaves)   How many days per week to you exercise? 7   How many minutes per day do you exercise? 45   Total minutes per week of exercise 315   Patient Education   Nutrition management  Role of diet in the treatment of diabetes and the relationship between the three main macronutrients and blood glucose level;Food label reading, portion sizes and measuring food.;Carbohydrate counting   Physical activity and exercise  Role of exercise on diabetes management, blood pressure control and cardiac health.   Medications Reviewed patients medication for diabetes, action, purpose, timing of dose and side effects.   Monitoring Purpose and frequency of SMBG.   Individualized Goals (developed by patient)   Nutrition Follow meal plan discussed   Physical Activity Exercise 5-7 days per week   Medications take my medication as prescribed   Monitoring  test blood glucose pre and post meals as discussed   Patient Self-Evaluation of Goals - Patient rates self as meeting previously set goals (% of time)   Nutrition 50 - 75 %   Physical Activity >75%   Medications >75%   Monitoring >75%   Outcomes   Program Status Completed   Subsequent Visit   Since your last visit have you experienced any weight changes? Loss   Weight Loss (lbs) 20   Since your last visit, are you checking your blood glucose at least once a day? Yes      Learning Objective:  Patient will have a greater understanding of diabetes self-management. Patient education plan is to attend individual and/or group sessions per assessed needs and concerns.   Plan:   Patient Instructions  We discussed Carb counting more today and we have agreed for you to: Aim for 2-3 Carb Choices per meal (30-45 grams) +/- 1 either way  Aim for 0-15 Carbs per snack if hungry  Include protein in moderation with your meals and snacks Consider reading food labels for Total Carbohydrate of foods continue your activity level by walking daily as tolerated Consider checking BG at alternate times per day to include before breakfast and two hour after a meal as directed by MD  Continue taking medication as directed by MD        Expected Outcomes:  Demonstrated interest in learning. Expect positive outcomes  Education material provided: Food label handouts, Meal plan card and  Carbohydrate counting sheet  If problems or questions, patient to contact team via:  Phone and Email  Future DSME appointment: - PRN

## 2015-12-04 ENCOUNTER — Ambulatory Visit: Payer: Medicare Other | Admitting: *Deleted

## 2015-12-12 ENCOUNTER — Encounter: Payer: Self-pay | Admitting: Cardiothoracic Surgery

## 2015-12-12 ENCOUNTER — Ambulatory Visit (INDEPENDENT_AMBULATORY_CARE_PROVIDER_SITE_OTHER): Payer: Self-pay | Admitting: Cardiothoracic Surgery

## 2015-12-12 ENCOUNTER — Encounter: Payer: Self-pay | Admitting: Internal Medicine

## 2015-12-12 VITALS — BP 104/74 | HR 68 | Resp 20 | Ht 66.0 in | Wt 161.0 lb

## 2015-12-12 DIAGNOSIS — Z951 Presence of aortocoronary bypass graft: Secondary | ICD-10-CM

## 2015-12-12 NOTE — Progress Notes (Signed)
PCP is  Melinda Crutch, MD Referring Provider is Jacolyn Reedy, MD  Chief Complaint  Patient presents with  . 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 He is a poorly controlled diabetic with suboptimal targets. He had early graft closure of 2 of the bypass grafts to the circumflex system He continues to progress and has had no recurrent angina symptoms of CHF. He is ready to start outpatient cardiac rehabilitation, driving, and he may lift up to 20 pounds maximum until 3 months after surgery. Surgical incisions are all well-healed. His blood pressure has been well controlled.  Past Medical History  Diagnosis Date  . Diabetes mellitus without complication ([place])   . Hypertension   . Hyperlipidemia   . Kidney stones   . CAD (coronary artery disease), native coronary artery 09/24/2015  . History of kidney stones   . 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 Cataract extraction, bilateral    . Leg surgery  70 yrs old    from fx. right  . Appendectomy    . Left heart catheterization with coronary angiogram N/A 04/13/2014    Procedure: LEFT HEART CATHETERIZATION WITH CORONARY ANGIOGRAM;  Surgeon: Jacolyn Reedy, MD;  Location[facility] CATH LAB;  Service: Cardiovascular;  Laterality: N/A;  . Cardiac catheterization N/A 09/26/2015    Procedure: Left Heart Cath and Coronary Angiography;  Surgeon: Troy Sine, MD;  Location: [facility] CV LAB;  Service: Cardiovascular;  Laterality: N/A;  . Coronary artery bypass graft N/A 10/02/2015    Procedure: CORONARY ARTERY BYPASS GRAFTING (CABG) X5 LIMA-LAD; SVG-DIAG; SVG-OM; SVG-PD; SVG-RAMUS TRANSESOPHAGEAL ECHOCARDIOGRAM (TEE) ENDOSCOPIC GREATER SAPHENOUS VEIN HARVEST BILAT LE;  Surgeon: Ivin Poot, MD;  Location: [facility];  Service: Open Heart Surgery;  Laterality: N/A;  . Tee  without cardioversion N/A 10/02/2015    Procedure: TRANSESOPHAGEAL ECHOCARDIOGRAM (TEE);  Surgeon: Ivin Poot, MD;  Location: [place];  Service: Open Heart Surgery;  Laterality: N/A;  . Cardiac catheterization N/A 10/22/2015    Procedure: Left Heart Cath and Coronary Angiography;  Surgeon: Sherren Mocha, MD;  Location: [facility] CV LAB;  Service: Cardiovascular;  Laterality: N/A;    Family History  Problem Relation Age of Onset  . Heart attack Mother   . Heart attack Father   . Diabetes Brother   . Pancreatic cancer Brother   . Colon cancer Neg Hx   . Esophageal cancer Neg Hx   . Prostate cancer Neg Hx   . Rectal cancer Neg Hx   . Stomach cancer Neg Hx   . Diabetes Brother     Social History Social History  Substance Use Topics  . Smoking status: Never Smoker   . Smokeless tobacco: Never Used  . Alcohol Use: Yes     Comment: 1 beer/wine every 2-3 months per patient    Current Outpatient Prescriptions  Medication Sig Dispense Refill  . aspirin 81 MG chewable tablet Chew 1 tablet (81 mg total) by mouth daily.    [name] atorvastatin (LIPITOR) 40 MG tablet Take 40 mg by mouth daily.    . clopidogrel (PLAVIX) 75 MG tablet Take 1 tablet (75 mg total) by mouth daily with breakfast. 30 tablet 12  . glimepiride (AMARYL) 4 MG tablet Take 4 mg by mouth daily with breakfast.     .  iron polysaccharides (NIFEREX) 150 MG capsule Take 150 mg by mouth daily.    [name] lactobacillus acidophilus (BACID) TABS tablet Take 2 tablets by mouth daily.    . metoprolol succinate (TOPROL-XL) 25 MG 24 hr tablet Take 25 mg by mouth daily.    . Multiple Vitamin (MULTIVITAMIN) tablet Take 1 tablet by mouth daily.    . nitroGLYCERIN (NITROSTAT) 0.4 MG SL tablet Place 1 tablet (0.4 mg total) under the tongue every 5 (five) minutes x 3 doses as needed for chest pain. 25 tablet 12  . ondansetron (ZOFRAN-ODT) 4 MG disintegrating tablet Take 4 mg by mouth every 8 (eight) hours as needed for nausea or vomiting. Pt states  he takes at home when he feels nauseous    . pantoprazole (PROTONIX) 40 MG tablet Take 40 mg by mouth daily.     No current facility-administered medications for this visit.    Allergies  Allergen Reactions  . Actos [Pioglitazone]     Itching, rash   . Levaquin [Levofloxacin] Itching and Rash  . Lisinopril Itching and Rash    Review of Systems  Appetite and strength Significantly improved The patient is starting to gain weight back from postoperative poor appetite  BP 104/74 mmHg  Pulse 68  Resp 20  Ht 5\' 6"  (1.676 m)  Wt 161 lb (73.029 kg)  BMI 26.00 kg/m2  SpO2 98% Physical Exam Alert and comfortable Lungs clear Heart rate regular with intermittent ectopy Sternal incision stable well-healed Leg incision is well-healed. Good Peripheral perfusion without pedal edema  Diagnostic Tests:  No chest x-ray today Impression: Patient appears to be at least 60% recovered from his urgent multivessel CABG. He will benefit from a course of outpatient cardiac rehabilitation phase II. He was recommended that he contact his primary care physician because of elevated blood sugars 250-300 this past week. He'll return here as needed.     Len Childs, MD [facility] [phone]

## 2015-12-13 ENCOUNTER — Encounter (HOSPITAL_COMMUNITY)
Admission: RE | Admit: 2015-12-13 | Discharge: 2015-12-13 | Disposition: A | Discharge status: Home / Self Care | Payer: Medicare Other | Source: Ambulatory Visit | Attending: Cardiology | Admitting: Cardiology

## 2015-12-13 ENCOUNTER — Encounter (HOSPITAL_COMMUNITY): Payer: Self-pay

## 2015-12-13 VITALS — BP 126/66 | HR 88 | Ht 65.25 in | Wt 161.2 lb

## 2015-12-13 DIAGNOSIS — Z951 Presence of aortocoronary bypass graft: Secondary | ICD-10-CM | POA: Diagnosis present

## 2015-12-13 DIAGNOSIS — I2119 ST elevation (STEMI) myocardial infarction involving other coronary artery of inferior wall: Secondary | ICD-10-CM

## 2015-12-13 DIAGNOSIS — I213 ST elevation (STEMI) myocardial infarction of unspecified site: Secondary | ICD-10-CM | POA: Diagnosis present

## 2015-12-13 NOTE — Progress Notes (Signed)
Cardiac Rehab Medication Review by a Pharmacist  Does the patient  feel that his/her medications are working for him/her?  yes  Has the patient been experiencing any side effects to the medications prescribed?  no  Does the patient measure his/her own blood pressure or blood glucose at home?  yes   Does the patient have any problems obtaining medications due to transportation or finances?   no  Understanding of regimen: good Understanding of indications: good Potential of compliance: good  Pharmacist comments: Fernando Hess is a 70 yo M who is doing well with his medications. He reports good compliance and denies any problems with his medications. He is measuring his blood glucose at home and brought his recordings today. He has had sugars mostly in the 200s. I encouraged him to continue checking his blood glucose and to bring his numbers to his next appointment.  [name], PharmD. PGY-1 Pharmacy Resident Pager: [phone] 12/13/2015 8:09 AM

## 2015-12-13 NOTE — Progress Notes (Signed)
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  aspirin 81 MG chewable tablet, Chew 1 tablet (81 mg total) by mouth daily., Disp: , Rfl:  .  atorvastatin (LIPITOR) 40 MG tablet, Take 40 mg by mouth daily., Disp: , Rfl:  .  clopidogrel (PLAVIX) 75 MG tablet, Take 1 tablet (75 mg total) by mouth daily with breakfast., Disp: 30 tablet, Rfl: 12 .  Docusate Calcium (STOOL SOFTENER PO), Take 1 capsule by mouth daily., Disp: , Rfl:  .  glimepiride (AMARYL) 4 MG tablet, Take 4 mg by mouth daily with breakfast. , Disp: , Rfl:  .  iron polysaccharides (NIFEREX) 150 MG capsule, Take 150 mg by mouth daily., Disp: , Rfl:  .  lactobacillus acidophilus (BACID) TABS tablet, Take 2 tablets by mouth daily., Disp: , Rfl:  .  metoprolol succinate (TOPROL-XL) 25 MG 24 hr tablet, Take 25 mg by mouth daily., Disp: , Rfl:  .  Multiple Vitamin (MULTIVITAMIN) tablet, Take 1 tablet by mouth daily., Disp: , Rfl:  .  nitroGLYCERIN (NITROSTAT) 0.4 MG SL tablet, Place 1 tablet (0.4 mg total) under the tongue every 5 (five) minutes x 3 doses as needed for chest pain., Disp: 25 tablet, Rfl: 12 .  ondansetron (ZOFRAN-ODT) 4 MG disintegrating tablet, Take 4 mg by mouth every 8 (eight) hours as needed for nausea or vomiting. Pt states he takes at home when he feels nauseous, Disp: , Rfl:  .  pantoprazole (PROTONIX) 40 MG tablet, Take 40 mg by mouth daily., Disp: , Rfl:   Past Medical History: Past Medical History  Diagnosis Date  . Diabetes mellitus without  complication ([place])   . Hypertension   . Hyperlipidemia   . Kidney stones   . CAD (coronary artery disease), native coronary artery 09/24/2015  . History of kidney stones   . 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 Asthma     Tobacco Use: History  Smoking status  . Never Smoker   Smokeless tobacco  . Never Used    Labs: Recent Review Flowsheet Data    Labs for ITP Cardiac and Pulmonary Rehab Latest Ref Rng 10/03/2015 10/03/2015 10/03/2015 10/22/2015 10/23/2015   Cholestrol 0 - 200 mg/dL - - - - 81   LDLCALC 0 - 99 mg/dL - - - - 31   HDL >40 mg/dL - - - - 30(L)   Trlycerides <150 mg/dL - - - - 102   Hemoglobin A1c 4.8 - 5.6 % - - - 6.6(H) -   PHART 7.350 - 7.450 7.421 7.434 - - -   PCO2ART 35.0 - 45.0 mmHg 33.9(L) 36.4 - - -   HCO3 20.0 - 24.0 mEq/L 22.0 24.3(H) - - -   TCO2 0 - 100 mmol/L 23 25 21 23  -   ACIDBASEDEF 0.0 - 2.0 mmol/L 2.0 - - - -   O2SAT - 92.0 95.0 - - -      Capillary Blood Glucose: Lab Results  Component Value Date   GLUCAP  176* 10/25/2015   GLUCAP 145* 10/25/2015   GLUCAP 114* 10/24/2015   GLUCAP 160* 10/24/2015   GLUCAP 213* 10/24/2015     Exercise Target Goals: Date: 12/13/15  Exercise Program Goal: Individual exercise prescription set with THRR, safety & activity barriers. Participant demonstrates ability to understand and report RPE using BORG scale, to self-measure pulse accurately, and to acknowledge the importance of the exercise prescription.  Exercise Prescription Goal: Starting with aerobic activity 30 plus minutes a day, 3 days per week for initial exercise prescription. Provide home exercise prescription and guidelines that participant acknowledges understanding prior to discharge.  Activity Barriers & Risk Stratification:     Activity Barriers & Cardiac Risk Stratification - 12/13/15 0753    Activity Barriers & Cardiac Risk Stratification   Activity Barriers Deconditioning;Muscular  Weakness;Incisional Pain;Other (comment)   Comments Occasional Dizziness   Cardiac Risk Stratification High      6 Minute Walk:     6 Minute Walk      12/13/15 0740       6 Minute Walk   Phase Initial     Distance 1752 feet     Walk Time 6 minutes     # of Rest Breaks 0     MPH 3.32     METS 3.62     RPE 12     VO2 Peak 12.67     Symptoms No     Resting HR 88 bpm     Resting BP 126/66 mmHg     Max Ex. HR 96 bpm     Max Ex. BP 132/72 mmHg     2 Minute Post BP 136/74 mmHg        Initial Exercise Prescription:     Initial Exercise Prescription - 12/13/15 1000    Date of Initial Exercise Prescription   Date 12/13/15   Bike   Level 1   Minutes 10   METs 3.6   NuStep   Level 3   Minutes 10   METs 2   Track   Laps 10   Minutes 10   METs 2.76   Prescription Details   Frequency (times per week) 3   Duration Progress to 30 minutes of continuous aerobic without signs/symptoms of physical distress   Intensity   THRR 40-80% of Max Heartrate 60-119   Ratings of Perceived Exertion 11-13   Progression   Progression Continue to progress workloads to maintain intensity without signs/symptoms of physical distress.   Resistance Training   Training Prescription Yes   Weight 2lbs   Reps 10-12      Perform Capillary Blood Glucose checks as needed.  Exercise Prescription Changes:   Exercise Comments:   Discharge Exercise Prescription (Final Exercise Prescription Changes):   Nutrition:  Target Goals: Understanding of nutrition guidelines, daily intake of sodium 1500mg , cholesterol 200mg , calories 30% from fat and 7% or less from saturated fats, daily to have 5 or more servings of fruits and vegetables.  Biometrics:     Pre Biometrics - 12/13/15 1100    Pre Biometrics   Height 5' 5.25" (1.657 m)   Weight 161 lb 2.5 oz (73.1 kg)   Waist Circumference 35 inches   Hip Circumference 36 inches   Waist to Hip Ratio 0.97 %   BMI (Calculated) 26.7   Triceps  Skinfold 18 mm   % Body Fat 25.9 %   Grip Strength 39 kg   Flexibility 8 in   Single Leg Stand 2.21 seconds  Nutrition Therapy Plan and Nutrition Goals:     Nutrition Therapy & Goals - 12/13/15 1007    Nutrition Therapy   Diet Diabetic, Therapeutic Lifestyle Changes   Personal Nutrition Goals   Personal Goal #1 Maintain current weight   Intervention Plan   Intervention Prescribe, educate and counsel regarding individualized specific dietary modifications aiming towards targeted core components such as weight, hypertension, lipid management, diabetes, heart failure and other comorbidities.   Expected Outcomes Short Term Goal: Understand basic principles of dietary content, such as calories, fat, sodium, cholesterol and nutrients.;Long Term Goal: Adherence to prescribed nutrition plan.      Nutrition Discharge: Nutrition Scores:   Nutrition Goals Re-Evaluation:   Psychosocial: Target Goals: Acknowledge presence or absence of depression, maximize coping skills, provide positive support system. Participant is able to verbalize types and ability to use techniques and skills needed for reducing stress and depression.  Initial Review & Psychosocial Screening:   Quality of Life Scores:     Quality of Life - 12/13/15 1101    Quality of Life Scores   Health/Function Pre 22.43 %   Socioeconomic Pre 24.85 %   Psych/Spiritual Pre 23.57 %   Family Pre 22.1 %   GLOBAL Pre 22.97 %      PHQ-9:     Recent Review Flowsheet Data    Depression screen [employer] 2/9 11/29/2015 09/04/2015 08/09/2014 05/01/2014   Decreased Interest 0 0 0 0   Down, Depressed, Hopeless 1 - 1 0   PHQ - 2 Score 1 0 1 0      Psychosocial Evaluation and Intervention:   Psychosocial Re-Evaluation:   Vocational Rehabilitation: Provide vocational rehab assistance to qualifying candidates.   Vocational Rehab Evaluation & Intervention:     Vocational Rehab - 12/13/15 1421    Initial Vocational Rehab  Evaluation & Intervention   Assessment shows need for Vocational Rehabilitation No  Pt indicated that he was retired and did not plan to return to competitive employment.      Education: Education Goals: Education classes will be provided on a weekly basis, covering required topics. Participant will state understanding/return demonstration of topics presented.  Learning Barriers/Preferences:     Learning Barriers/Preferences - 12/13/15 1059    Learning Barriers/Preferences   Learning Barriers Sight  Glasses   Learning Preferences Individual Instruction;Written Material      Education Topics: Count Your Pulse:  -Group instruction provided by verbal instruction, demonstration, patient participation and written materials to support subject.  Instructors address importance of being able to find your pulse and how to count your pulse when at home without a heart monitor.  Patients get hands on experience counting their pulse with staff help and individually.   Heart Attack, Angina, and Risk Factor Modification:  -Group instruction provided by verbal instruction, video, and written materials to support subject.  Instructors address signs and symptoms of angina and heart attacks.    Also discuss risk factors for heart disease and how to make changes to improve heart health risk factors.   Functional Fitness:  -Group instruction provided by verbal instruction, demonstration, patient participation, and written materials to support subject.  Instructors address safety measures for doing things around the house.  Discuss how to get up and down off the floor, how to pick things up properly, how to safely get out of a chair without assistance, and balance training.   Meditation and Mindfulness:  -Group instruction provided by verbal instruction, patient participation, and written materials to support subject.  Instructor addresses importance of mindfulness and meditation practice to help reduce  stress and improve awareness.  Instructor also leads participants through a meditation exercise.    Stretching for Flexibility and Mobility:  -Group instruction provided by verbal instruction, patient participation, and written materials to support subject.  Instructors lead participants through series of stretches that are designed to increase flexibility thus improving mobility.  These stretches are additional exercise for major muscle groups that are typically performed during regular warm up and cool down.   Hands Only CPR Anytime:  -Group instruction provided by verbal instruction, video, patient participation and written materials to support subject.  Instructors co-teach with AHA video for hands only CPR.  Participants get hands on experience with mannequins.   Nutrition I class: Heart Healthy Eating:  -Group instruction provided by PowerPoint slides, verbal discussion, and written materials to support subject matter. The instructor gives an explanation and review of the Therapeutic Lifestyle Changes diet recommendations, which includes a discussion on lipid goals, dietary fat, sodium, fiber, plant stanol/sterol esters, sugar, and the components of a well-balanced, healthy diet.   Nutrition II class: Lifestyle Skills:  -Group instruction provided by PowerPoint slides, verbal discussion, and written materials to support subject matter. The instructor gives an explanation and review of label reading, grocery shopping for heart health, heart healthy recipe modifications, and ways to make healthier choices when eating out.   Diabetes Question & Answer:  -Group instruction provided by PowerPoint slides, verbal discussion, and written materials to support subject matter. The instructor gives an explanation and review of diabetes co-morbidities, pre- and post-prandial blood glucose goals, pre-exercise blood glucose goals, signs, symptoms, and treatment of hypoglycemia and hyperglycemia, and foot  care basics.   Diabetes Blitz:  -Group instruction provided by PowerPoint slides, verbal discussion, and written materials to support subject matter. The instructor gives an explanation and review of the physiology behind type 1 and type 2 diabetes, diabetes medications and rational behind using different medications, pre- and post-prandial blood glucose recommendations and Hemoglobin A1c goals, diabetes diet, and exercise including blood glucose guidelines for exercising safely.    Portion Distortion:  -Group instruction provided by PowerPoint slides, verbal discussion, written materials, and food models to support subject matter. The instructor gives an explanation of serving size versus portion size, changes in portions sizes over the last 20 years, and what consists of a serving from each food group.   Stress Management:  -Group instruction provided by verbal instruction, video, and written materials to support subject matter.  Instructors review role of stress in heart disease and how to cope with stress positively.     Exercising on Your Own:  -Group instruction provided by verbal instruction, power point, and written materials to support subject.  Instructors discuss benefits of exercise, components of exercise, frequency and intensity of exercise, and end points for exercise.  Also discuss use of nitroglycerin and activating EMS.  Review options of places to exercise outside of rehab.  Review guidelines for sex with heart disease.   Cardiac Drugs I:  -Group instruction provided by verbal instruction and written materials to support subject.  Instructor reviews cardiac drug classes: antiplatelets, anticoagulants, beta blockers, and statins.  Instructor discusses reasons, side effects, and lifestyle considerations for each drug class.   Cardiac Drugs II:  -Group instruction provided by verbal instruction and written materials to support subject.  Instructor reviews cardiac drug classes:  angiotensin converting enzyme inhibitors (ACE-I), angiotensin II receptor blockers (ARBs), nitrates, and calcium channel blockers.  Instructor discusses reasons, side effects, and lifestyle considerations for each drug class.   Anatomy and Physiology of the Circulatory System:  -Group instruction provided by verbal instruction, video, and written materials to support subject.  Reviews functional anatomy of heart, how it relates to various diagnoses, and what role the heart plays in the overall system.   Knowledge Questionnaire Score:     Knowledge Questionnaire Score - 12/13/15 1100    Knowledge Questionnaire Score   Pre Score 24/24      Core Components/Risk Factors/Patient Goals at Admission:     Personal Goals and Risk Factors at Admission - 12/13/15 0754    Core Components/Risk Factors/Patient Goals on Admission    Weight Management Yes;Weight Loss   Intervention Weight Management: Develop a combined nutrition and exercise program designed to reach desired caloric intake, while maintaining appropriate intake of nutrient and fiber, sodium and fats, and appropriate energy expenditure required for the weight goal.;Weight Management: Provide education and appropriate resources to help participant work on and attain dietary goals.   Admit Weight 161 lb 2.5 oz (73.1 kg)   Goal Weight: Short Term 155 lb (70.308 kg)   Goal Weight: Long Term 150 lb (68.04 kg)   Expected Outcomes Short Term: Continue to assess and modify interventions until short term weight is achieved;Long Term: Adherence to nutrition and physical activity/exercise program aimed toward attainment of established weight goal;Weight Loss: Understanding of general recommendations for a balanced deficit meal plan, which promotes 1-2 lb weight loss per week and includes a negative energy balance of [id number] kcal/d;Understanding recommendations for meals to include 15-35% energy as protein, 25-35% energy from fat, 35-60% energy from  carbohydrates, less than 200mg  of dietary cholesterol, 20-35 gm of total fiber daily;Understanding of distribution of calorie intake throughout the day with the consumption of 4-5 meals/snacks   Sedentary Yes   Intervention Provide advice, education, support and counseling about physical activity/exercise needs.;Develop an individualized exercise prescription for aerobic and resistive training based on initial evaluation findings, risk stratification, comorbidities and participant's personal goals.   Expected Outcomes Achievement of increased cardiorespiratory fitness and enhanced flexibility, muscular endurance and strength shown through measurements of functional capacity and personal statement of participant.   Increase Strength and Stamina Yes   Intervention Provide advice, education, support and counseling about physical activity/exercise needs.;Develop an individualized exercise prescription for aerobic and resistive training based on initial evaluation findings, risk stratification, comorbidities and participant's personal goals.   Expected Outcomes Achievement of increased cardiorespiratory fitness and enhanced flexibility, muscular endurance and strength shown through measurements of functional capacity and personal statement of participant.   Diabetes Yes   Intervention Provide education about signs/symptoms and action to take for hypo/hyperglycemia.;Provide education about proper nutrition, including hydration, and aerobic/resistive exercise prescription along with prescribed medications to achieve blood glucose in normal ranges: Fasting glucose 65-99 mg/dL   Expected Outcomes Short Term: Participant verbalizes understanding of the signs/symptoms and immediate care of hyper/hypoglycemia, proper foot care and importance of medication, aerobic/resistive exercise and nutrition plan for blood glucose control.;Long Term: Attainment of HbA1C < 7%.   Hypertension Yes   Intervention Provide education  on lifestyle modifcations including regular physical activity/exercise, weight management, moderate sodium restriction and increased consumption of fresh fruit, vegetables, and low fat dairy, alcohol moderation, and smoking cessation.;Monitor prescription use compliance.   Expected Outcomes Short Term: Continued assessment and intervention until BP is < 140/36mm HG in hypertensive participants. < 130/20mm HG in hypertensive participants with diabetes, heart failure or chronic kidney disease.;Long  Term: Maintenance of blood pressure at goal levels.   Lipids Yes   Intervention Provide education and support for participant on nutrition & aerobic/resistive exercise along with prescribed medications to achieve LDL 70mg , HDL >40mg .   Expected Outcomes Short Term: Participant states understanding of desired cholesterol values and is compliant with medications prescribed. Participant is following exercise prescription and nutrition guidelines.;Long Term: Cholesterol controlled with medications as prescribed, with individualized exercise RX and with personalized nutrition plan. Value goals: LDL < 70mg , HDL > 40 mg.   Personal Goal Other Yes   Personal Goal Feel better-Have more energy   Intervention Provide exercise prescriptiona and education for lifestyle changes   Expected Outcomes Able to return to normal activities      Core Components/Risk Factors/Patient Goals Review:    Core Components/Risk Factors/Patient Goals at Discharge (Final Review):    ITP Comments:     ITP Comments      12/13/15 0752           ITP Comments Medical Director-Dr. Fransico Him, MD          Comments: Pt is a prior participant in cardiac rehab. As a part of orientation, pt completed a 6 minute walk test. Monitor showed SR with one pvc.  Compared to most recent office ekg. Cherre Huger, BSN

## 2015-12-17 ENCOUNTER — Encounter (HOSPITAL_COMMUNITY)
Admission: RE | Admit: 2015-12-17 | Discharge: 2015-12-17 | Disposition: A | Discharge status: Home / Self Care | Payer: Medicare Other | Source: Ambulatory Visit | Attending: Cardiology | Admitting: Cardiology

## 2015-12-17 DIAGNOSIS — Z951 Presence of aortocoronary bypass graft: Secondary | ICD-10-CM | POA: Diagnosis present

## 2015-12-17 DIAGNOSIS — I213 ST elevation (STEMI) myocardial infarction of unspecified site: Secondary | ICD-10-CM | POA: Diagnosis present

## 2015-12-17 LAB — GLUCOSE, CAPILLARY
Glucose-Capillary: 186 mg/dL — ABNORMAL HIGH (ref 65–99)
Glucose-Capillary: 210 mg/dL — ABNORMAL HIGH (ref 65–99)

## 2015-12-17 NOTE — Progress Notes (Signed)
Daily Session Note  Patient Details  Name: Fernando Hess MRN: [id number] Date of Birth: 1946/06/27 Referring Provider:  Tollie Eth  Encounter Date: 12/17/2015  Check In:     Session Check In - 12/17/15 1239    Check-In   Location [facility]   Staff Present Maurice Small, RN, Levie Heritage, MA, ACSM RCEP, Exercise Physiologist;Amber Fair, MS, ACSM RCEP, Exercise Physiologist;Maria Whitaker, RN, BSN;Molly diVincenzo, MS, ACSM RCEP, Exercise Physiologist   Supervising physician immediately available to respond to emergencies [facility] immediately available   Physician(s) Dr. Sunnie Nielsen   Medication changes reported     No   Fall or balance concerns reported    No   Warm-up and Cool-down Performed as group-led instruction   Resistance Training Performed Yes   VAD Patient? No   Pain Assessment   Currently in Pain? No/denies      Capillary Blood Glucose: Results for orders placed or performed during the hospital encounter of 12/17/15 (from the past 24 hour(s))  Glucose, capillary     Status: Abnormal   Collection Time: 12/17/15 12:27 PM  Result Value Ref Range   Glucose-Capillary 210 (H) 65 - 99 mg/dL     Goals Met:  Personal goals reviewed  Goals Unmet:  Not Applicable  Comments: Pt started full exercise today at cardiac rehab.  Pt tolerated light exercise with some complaints of fatigue related to the stationary bike.  Pt had walked earlier for 30 minutes prior to exercise. difficulty. VSS with elevation of bp on exertion/bike, telemetry-SR with sinus arrythmias, asymptomatic.  Medication list reconciled. Pt denies barriers to medicaiton compliance.  PSYCHOSOCIAL ASSESSMENT:  PHQ-1. Pt exhibits positive coping skills but generally is tired of feeling bad over the last 6 months. Pt wife and he have a relationship that "works" for them.  Pt thinks his relationship has gotten some better with his wife since his heart surgery. Continue to encourage  and support pt.   Pt enjoys walking and spending time with his wife.   Pt oriented to exercise equipment and routine.    Understanding verbalized.    Dr. Fransico Him is Medical Director for Cardiac Rehab at [facility].

## 2015-12-18 ENCOUNTER — Ambulatory Visit: Payer: Medicare Other | Admitting: *Deleted

## 2015-12-19 ENCOUNTER — Encounter (HOSPITAL_COMMUNITY)
Admission: RE | Admit: 2015-12-19 | Discharge: 2015-12-19 | Disposition: A | Discharge status: Home / Self Care | Payer: Medicare Other | Source: Ambulatory Visit | Attending: Cardiology | Admitting: Cardiology

## 2015-12-19 DIAGNOSIS — I2119 ST elevation (STEMI) myocardial infarction involving other coronary artery of inferior wall: Secondary | ICD-10-CM

## 2015-12-19 DIAGNOSIS — Z951 Presence of aortocoronary bypass graft: Secondary | ICD-10-CM | POA: Diagnosis not present

## 2015-12-19 LAB — GLUCOSE, CAPILLARY
Glucose-Capillary: 190 mg/dL — ABNORMAL HIGH (ref 65–99)
Glucose-Capillary: 195 mg/dL — ABNORMAL HIGH (ref 65–99)

## 2015-12-21 ENCOUNTER — Encounter (HOSPITAL_COMMUNITY)
Admission: RE | Admit: 2015-12-21 | Discharge: 2015-12-21 | Disposition: A | Discharge status: Home / Self Care | Payer: Medicare Other | Source: Ambulatory Visit | Attending: Cardiology | Admitting: Cardiology

## 2015-12-21 DIAGNOSIS — Z951 Presence of aortocoronary bypass graft: Secondary | ICD-10-CM | POA: Diagnosis not present

## 2015-12-21 DIAGNOSIS — I2119 ST elevation (STEMI) myocardial infarction involving other coronary artery of inferior wall: Secondary | ICD-10-CM

## 2015-12-21 LAB — GLUCOSE, CAPILLARY
GLUCOSE-CAPILLARY: 171 mg/dL — AB (ref 65–99)
GLUCOSE-CAPILLARY: 175 mg/dL — AB (ref 65–99)

## 2015-12-24 ENCOUNTER — Encounter (HOSPITAL_COMMUNITY)
Admission: RE | Admit: 2015-12-24 | Discharge: 2015-12-24 | Disposition: A | Discharge status: Home / Self Care | Payer: Medicare Other | Source: Ambulatory Visit | Attending: Cardiology | Admitting: Cardiology

## 2015-12-24 DIAGNOSIS — Z951 Presence of aortocoronary bypass graft: Secondary | ICD-10-CM

## 2015-12-24 LAB — GLUCOSE, CAPILLARY
Glucose-Capillary: 168 mg/dL — ABNORMAL HIGH (ref 65–99)
Glucose-Capillary: 127 mg/dL — ABNORMAL HIGH (ref 65–99)

## 2015-12-24 NOTE — Progress Notes (Signed)
Daily Session Note  Patient Details  Name: Fernando Hess MRN: [id number] Date of Birth: 09-23-1945 Referring Provider: Tollie Eth MD  Encounter Date: 12/24/2015  Check In:     Session Check In - 12/24/15 1210    Check-In   Location [facility]   Staff Present Maurice Small, RN, Levie Heritage, MA, ACSM RCEP, Exercise Physiologist;Portia Rollene Rotunda, RN, BSN;Amber Fair, MS, ACSM RCEP, Exercise Physiologist   Supervising physician immediately available to respond to emergencies [facility] immediately available   Physician(s) Dr. Marily Memos   Medication changes reported     No   Fall or balance concerns reported    No   Warm-up and Cool-down Performed as group-led instruction   VAD Patient? No   Pain Assessment   Currently in Pain? No/denies   Multiple Pain Sites No      Capillary Blood Glucose: Results for orders placed or performed during the hospital encounter of 12/24/15 (from the past 24 hour(s))  Glucose, capillary     Status: Abnormal   Collection Time: 12/24/15 11:21 AM  Result Value Ref Range   Glucose-Capillary 168 (H) 65 - 99 mg/dL  Glucose, capillary     Status: Abnormal   Collection Time: 12/24/15 12:19 PM  Result Value Ref Range   Glucose-Capillary 127 (H) 65 - 99 mg/dL     Goals Met:  No report of cardiac concerns or symptoms  Goals Unmet:  Not Applicable  Comments: QUALITY OF LIFE SCORE REVIEW  Pt completed Quality of Life survey as a participant in Cardiac Rehab. Scores 21.0 or below are considered low.Pt scored the following:     Quality of Life - 12/13/15 1101    Quality of Life Scores   Health/Function Pre 22.43 %   Socioeconomic Pre 24.85 %   Psych/Spiritual Pre 23.57 %   Family Pre 22.1 %   GLOBAL Pre 22.97 %     Pt demonstrates healthy and positive coping skills. Will continue to monitor and intervene as necessary.  Maurice Small RN, BSN      Dr. Fransico Him is Medical Director for Cardiac Rehab at  [facility].

## 2015-12-26 ENCOUNTER — Encounter (HOSPITAL_COMMUNITY)
Admission: RE | Admit: 2015-12-26 | Discharge: 2015-12-26 | Disposition: A | Discharge status: Home / Self Care | Payer: Medicare Other | Source: Ambulatory Visit | Attending: Cardiology | Admitting: Cardiology

## 2015-12-26 DIAGNOSIS — Z951 Presence of aortocoronary bypass graft: Secondary | ICD-10-CM

## 2015-12-28 ENCOUNTER — Encounter (HOSPITAL_COMMUNITY): Admission: RE | Admit: 2015-12-28 | Payer: Medicare Other | Source: Ambulatory Visit

## 2015-12-31 ENCOUNTER — Encounter (HOSPITAL_COMMUNITY): Payer: Medicare Other

## 2016-01-02 ENCOUNTER — Encounter (HOSPITAL_COMMUNITY): Payer: Medicare Other

## 2016-01-04 ENCOUNTER — Encounter (HOSPITAL_COMMUNITY)
Admission: RE | Admit: 2016-01-04 | Discharge: 2016-01-04 | Disposition: A | Discharge status: Home / Self Care | Payer: Medicare Other | Source: Ambulatory Visit | Attending: Cardiology | Admitting: Cardiology

## 2016-01-04 DIAGNOSIS — Z951 Presence of aortocoronary bypass graft: Secondary | ICD-10-CM

## 2016-01-04 NOTE — Progress Notes (Signed)
Fernando Hess 69 y.o. male Nutrition Note Spoke with pt. Pt is slightly overweight. 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 Nutrition Plan and Nutrition Survey goals reviewed with pt. Pt is following Step 1 of the Therapeutic Lifestyle Changes diet. Pt is diabetic. Last A1c indicates blood glucose well-controlled. Pt checks CBG's 3 times a week. Fasting CBG's reportedly 116-130 mg/dL. Pt unable to avoid salt food due to frequency of eating out. Pt expressed understanding of the information reviewed. Pt aware of nutrition education classes offered and has previously attended the nutrition class offered.  Lab Results  Component Value Date   HGBA1C 6.6* 10/22/2015   Wt Readings from Last 3 Encounters:  12/13/15 161 lb 2.5 oz (73.1 kg)  12/12/15 161 lb (73.029 kg)  11/29/15 158 lb 1.6 oz (71.714 kg)   Nutrition Diagnosis ? Food-and nutrition-related knowledge deficit related to lack of exposure to information as related to diagnosis of: ? CVD ? DM ? Overweight related to excessive energy intake as evidenced by a BMI of 25.6  Nutrition RX/ Estimated Daily Nutrition Needs for: wt  maintenance 2000-2300 Kcal, 65-75 gm fat, 13-15 gm sat fat, 1.9-2.3 gm trans-fat, <1500 mg sodium, 250-325 gm CHO   Nutrition Intervention ? Pt's individual nutrition plan reviewed with pt. ? Benefits of adopting Therapeutic Lifestyle Changes discussed when Medficts reviewed. ? Pt to attend the Portion Distortion class ? Pt to attend the Diabetes Q & A class ? Pt to attend the   ? Nutrition I class - met during previous admission                 ? Nutrition II class - met during previous admission     ? Diabetes Blitz class - met during previous admission ? Continue client-centered nutrition education by RD, as part of interdisciplinary care. Goal(s) ? Pt to identify and limit food sources of saturated fat, trans fat, and sodium ? Use pre-meal and post-meal  CBG's and A1c to determine whether adjustments in food/meal planning will be beneficial or if any meds need to be combined with nutrition therapy. Monitor and Evaluate progress toward nutrition goal with team.  , M.Ed, RD, LDN, CDE 01/04/2016 12:06 PM  

## 2016-01-07 ENCOUNTER — Encounter (HOSPITAL_COMMUNITY): Payer: Medicare Other

## 2016-01-07 NOTE — Progress Notes (Signed)
Cardiac Individual Treatment Plan  Patient Details  Name: Fernando Hess MRN: [id number] Date of Birth: November 24, 1945 Referring Provider:        CARDIAC REHAB PHASE II EXERCISE from 12/26/2015 in [facility]   Referring Provider  Landry Corporal MD      Initial Encounter Date:       CARDIAC REHAB PHASE II EXERCISE from 12/26/2015 in [facility]   Date  12/13/15   Referring Provider  Landry Corporal MD      Visit Diagnosis: S/P CABG x 5  Patient's Home Medications on Admission:  Current outpatient prescriptions:  .  aspirin 81 MG chewable tablet, Chew 1 tablet (81 mg total) by mouth daily., Disp: , Rfl:  .  atorvastatin (LIPITOR) 40 MG tablet, Take 40 mg by mouth daily., Disp: , Rfl:  .  clopidogrel (PLAVIX) 75 MG tablet, Take 1 tablet (75 mg total) by mouth daily with breakfast., Disp: 30 tablet, Rfl: 12 .  Docusate Calcium (STOOL SOFTENER PO), Take 1 capsule by mouth daily., Disp: , Rfl:  .  glimepiride (AMARYL) 4 MG tablet, Take 4 mg by mouth daily with breakfast. , Disp: , Rfl:  .  iron polysaccharides (NIFEREX) 150 MG capsule, Take 150 mg by mouth daily., Disp: , Rfl:  .  lactobacillus acidophilus (BACID) TABS tablet, Take 2 tablets by mouth daily., Disp: , Rfl:  .  metoprolol succinate (TOPROL-XL) 25 MG 24 hr tablet, Take 25 mg by mouth daily., Disp: , Rfl:  .  Multiple Vitamin (MULTIVITAMIN) tablet, Take 1 tablet by mouth daily., Disp: , Rfl:  .  nitroGLYCERIN (NITROSTAT) 0.4 MG SL tablet, Place 1 tablet (0.4 mg total) under the tongue every 5 (five) minutes x 3 doses as needed for chest pain., Disp: 25 tablet, Rfl: 12 .  ondansetron (ZOFRAN-ODT) 4 MG disintegrating tablet, Take 4 mg by mouth every 8 (eight) hours as needed for nausea or vomiting. Pt states he takes at home when he feels nauseous, Disp: , Rfl:  .  pantoprazole (PROTONIX) 40 MG tablet, Take 40 mg by mouth daily., Disp: , Rfl:   Past Medical  History: Past Medical History  Diagnosis Date  . Diabetes mellitus without complication ([place])   . Hypertension   . Hyperlipidemia   . Kidney stones   . CAD (coronary artery disease), native coronary artery 09/24/2015  . History of kidney stones   . 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 Asthma     Tobacco Use: History  Smoking status  . Never Smoker   Smokeless tobacco  . Never Used    Labs:     Recent Review Flowsheet Data    Labs for ITP Cardiac and Pulmonary Rehab Latest Ref Rng 10/03/2015 10/03/2015 10/03/2015 10/22/2015 10/23/2015   Cholestrol 0 - 200 mg/dL - - - - 81   LDLCALC 0 - 99 mg/dL - - - - 31   HDL >40 mg/dL - - - - 30(L)   Trlycerides <150 mg/dL - - - - 102   Hemoglobin A1c 4.8 - 5.6 % - - - 6.6(H) -   PHART 7.350 - 7.450 7.421 7.434 - - -   PCO2ART 35.0 - 45.0 mmHg 33.9(L) 36.4 - - -   HCO3 20.0 - 24.0 mEq/L 22.0 24.3(H) - - -   TCO2 0 - 100 mmol/L 23 25 21 23  -   ACIDBASEDEF 0.0 - 2.0 mmol/L 2.0 - - - -  O2SAT - 92.0 95.0 - - -      Capillary Blood Glucose: Lab Results  Component Value Date   GLUCAP 127* 12/24/2015   GLUCAP 168* 12/24/2015   GLUCAP 175* 12/21/2015   GLUCAP 171* 12/21/2015   GLUCAP 190* 12/19/2015     Exercise Target Goals:    Exercise Program Goal: Individual exercise prescription set with THRR, safety & activity barriers. Participant demonstrates ability to understand and report RPE using BORG scale, to self-measure pulse accurately, and to acknowledge the importance of the exercise prescription.  Exercise Prescription Goal: Starting with aerobic activity 30 plus minutes a day, 3 days per week for initial exercise prescription. Provide home exercise prescription and guidelines that participant acknowledges understanding prior to discharge.  Activity Barriers & Risk Stratification:     Activity Barriers & Cardiac Risk Stratification - 12/13/15 0753    Activity Barriers & Cardiac Risk  Stratification   Activity Barriers Deconditioning;Muscular Weakness;Incisional Pain;Other (comment)   Comments Occasional Dizziness   Cardiac Risk Stratification High      6 Minute Walk:     6 Minute Walk      12/13/15 0740       6 Minute Walk   Phase Initial     Distance 1752 feet     Walk Time 6 minutes     # of Rest Breaks 0     MPH 3.32     METS 3.62     RPE 12     VO2 Peak 12.67     Symptoms No     Resting HR 88 bpm     Resting BP 126/66 mmHg     Max Ex. HR 96 bpm     Max Ex. BP 132/72 mmHg     2 Minute Post BP 136/74 mmHg        Initial Exercise Prescription:     Initial Exercise Prescription - 12/27/15 1400    Date of Initial Exercise RX and Referring Provider   Date 12/13/15   Referring Provider Landry Corporal MD   Bike   Level 1   Minutes 10   METs 3.6   NuStep   Level 3   Minutes 10   METs 2   Track   Laps 10   Minutes 10   METs 2.76   Prescription Details   Frequency (times per week) 3   Intensity   THRR 40-80% of Max Heartrate 60-119   Ratings of Perceived Exertion 11-13   Progression   Progression Continue to progress workloads to maintain intensity without signs/symptoms of physical distress.   Resistance Training   Training Prescription Yes   Weight 2lbs   Reps 10-12      Perform Capillary Blood Glucose checks as needed.  Exercise Prescription Changes:      Exercise Prescription Changes      01/01/16 0900           Exercise Review   Progression Yes       Response to Exercise   Blood Pressure (Admit) 120/80 mmHg       Blood Pressure (Exercise) 142/60 mmHg       Blood Pressure (Exit) 112/64 mmHg       Heart Rate (Admit) 80 bpm       Heart Rate (Exercise) 104 bpm       Heart Rate (Exit) 80 bpm       Rating of Perceived Exertion (Exercise) 12       Duration Progress to 30  minutes of continuous aerobic without signs/symptoms of physical distress       Intensity THRR unchanged       Progression   Progression Continue  to progress workloads to maintain intensity without signs/symptoms of physical distress.       Average METs 3       Resistance Training   Training Prescription Yes       Weight 2lbs       Reps 10-12       Interval Training   Interval Training No       Bike   Level 1       Minutes 10       METs 3.63       NuStep   Level 3       Minutes 10       METs 2.5       Track   Laps 11       Minutes 10       METs 2.91          Exercise Comments:      Exercise Comments      01/01/16 0907           Exercise Comments Pt is tolerating exercise well and continues to make progress.          Discharge Exercise Prescription (Final Exercise Prescription Changes):     Exercise Prescription Changes - 01/01/16 0900    Exercise Review   Progression Yes   Response to Exercise   Blood Pressure (Admit) 120/80 mmHg   Blood Pressure (Exercise) 142/60 mmHg   Blood Pressure (Exit) 112/64 mmHg   Heart Rate (Admit) 80 bpm   Heart Rate (Exercise) 104 bpm   Heart Rate (Exit) 80 bpm   Rating of Perceived Exertion (Exercise) 12   Duration Progress to 30 minutes of continuous aerobic without signs/symptoms of physical distress   Intensity THRR unchanged   Progression   Progression Continue to progress workloads to maintain intensity without signs/symptoms of physical distress.   Average METs 3   Resistance Training   Training Prescription Yes   Weight 2lbs   Reps 10-12   Interval Training   Interval Training No   Bike   Level 1   Minutes 10   METs 3.63   NuStep   Level 3   Minutes 10   METs 2.5   Track   Laps 11   Minutes 10   METs 2.91      Nutrition:  Target Goals: Understanding of nutrition guidelines, daily intake of sodium 1500mg , cholesterol 200mg , calories 30% from fat and 7% or less from saturated fats, daily to have 5 or more servings of fruits and vegetables.  Biometrics:     Pre Biometrics - 12/13/15 1100    Pre Biometrics   Height 5' 5.25" (1.657 m)    Weight 161 lb 2.5 oz (73.1 kg)   Waist Circumference 35 inches   Hip Circumference 36 inches   Waist to Hip Ratio 0.97 %   BMI (Calculated) 26.7   Triceps Skinfold 18 mm   % Body Fat 25.9 %   Grip Strength 39 kg   Flexibility 8 in   Single Leg Stand 2.21 seconds       Nutrition Therapy Plan and Nutrition Goals:     Nutrition Therapy & Goals - 12/13/15 1007    Nutrition Therapy   Diet Diabetic, Therapeutic Lifestyle Changes   Personal Nutrition Goals   Personal Goal #1  Maintain current weight   Intervention Plan   Intervention Prescribe, educate and counsel regarding individualized specific dietary modifications aiming towards targeted core components such as weight, hypertension, lipid management, diabetes, heart failure and other comorbidities.   Expected Outcomes Short Term Goal: Understand basic principles of dietary content, such as calories, fat, sodium, cholesterol and nutrients.;Long Term Goal: Adherence to prescribed nutrition plan.      Nutrition Discharge: Nutrition Scores:     Nutrition Assessments - 01/04/16 1205    MEDFICTS Scores   Pre Score 55      Nutrition Goals Re-Evaluation:   Psychosocial: Target Goals: Acknowledge presence or absence of depression, maximize coping skills, provide positive support system. Participant is able to verbalize types and ability to use techniques and skills needed for reducing stress and depression.  Initial Review & Psychosocial Screening:     Initial Psych Review & Screening - 12/17/15 1537    Initial Review   Current issues with Current Stress Concerns   Source of Stress Concerns Chronic Illness   Family Dynamics   Good Support System? Yes   Concerns Inappropriate over/under dependence on family/friends   Barriers   Psychosocial barriers to participate in program The patient should benefit from training in stress management and relaxation.   Screening Interventions   Interventions Encouraged to exercise       Quality of Life Scores:     Quality of Life - 12/13/15 1101    Quality of Life Scores   Health/Function Pre 22.43 %   Socioeconomic Pre 24.85 %   Psych/Spiritual Pre 23.57 %   Family Pre 22.1 %   GLOBAL Pre 22.97 %      PHQ-9:     Recent Review Flowsheet Data    Depression screen [facility] 2/9 12/17/2015 11/29/2015 09/04/2015 08/09/2014 05/01/2014   Decreased Interest 0 0 0 0 0   Down, Depressed, Hopeless 1 1 - 1 0   PHQ - 2 Score 1 1 0 1 0      Psychosocial Evaluation and Intervention:   Psychosocial Re-Evaluation:   Vocational Rehabilitation: Provide vocational rehab assistance to qualifying candidates.   Vocational Rehab Evaluation & Intervention:     Vocational Rehab - 12/13/15 1421    Initial Vocational Rehab Evaluation & Intervention   Assessment shows need for Vocational Rehabilitation No  Pt indicated that he was retired and did not plan to return to competitive employment.      Education: Education Goals: Education classes will be provided on a weekly basis, covering required topics. Participant will state understanding/return demonstration of topics presented.  Learning Barriers/Preferences:     Learning Barriers/Preferences - 12/13/15 1059    Learning Barriers/Preferences   Learning Barriers Sight  Glasses   Learning Preferences Individual Instruction;Written Material      Education Topics: Count Your Pulse:  -Group instruction provided by verbal instruction, demonstration, patient participation and written materials to support subject.  Instructors address importance of being able to find your pulse and how to count your pulse when at home without a heart monitor.  Patients get hands on experience counting their pulse with staff help and individually.      CARDIAC REHAB PHASE II EXERCISE from 01/09/2016 in [facility]   Date  12/21/15   Instruction Review Code  2- meets goals/outcomes      Heart Attack, Angina,  and Risk Factor Modification:  -Group instruction provided by verbal instruction, video, and written materials to support subject.  Instructors address signs  and symptoms of angina and heart attacks.    Also discuss risk factors for heart disease and how to make changes to improve heart health risk factors.   Functional Fitness:  -Group instruction provided by verbal instruction, demonstration, patient participation, and written materials to support subject.  Instructors address safety measures for doing things around the house.  Discuss how to get up and down off the floor, how to pick things up properly, how to safely get out of a chair without assistance, and balance training.      CARDIAC REHAB PHASE II EXERCISE from 01/09/2016 in [facility]   Date  01/04/16   Instruction Review Code  2- meets goals/outcomes      Meditation and Mindfulness:  -Group instruction provided by verbal instruction, patient participation, and written materials to support subject.  Instructor addresses importance of mindfulness and meditation practice to help reduce stress and improve awareness.  Instructor also leads participants through a meditation exercise.    Stretching for Flexibility and Mobility:  -Group instruction provided by verbal instruction, patient participation, and written materials to support subject.  Instructors lead participants through series of stretches that are designed to increase flexibility thus improving mobility.  These stretches are additional exercise for major muscle groups that are typically performed during regular warm up and cool down.   Hands Only CPR Anytime:  -Group instruction provided by verbal instruction, video, patient participation and written materials to support subject.  Instructors co-teach with AHA video for hands only CPR.  Participants get hands on experience with mannequins.   Nutrition I class: Heart Healthy Eating:  -Group  instruction provided by PowerPoint slides, verbal discussion, and written materials to support subject matter. The instructor gives an explanation and review of the Therapeutic Lifestyle Changes diet recommendations, which includes a discussion on lipid goals, dietary fat, sodium, fiber, plant stanol/sterol esters, sugar, and the components of a well-balanced, healthy diet.   Nutrition II class: Lifestyle Skills:  -Group instruction provided by PowerPoint slides, verbal discussion, and written materials to support subject matter. The instructor gives an explanation and review of label reading, grocery shopping for heart health, heart healthy recipe modifications, and ways to make healthier choices when eating out.   Diabetes Question & Answer:  -Group instruction provided by PowerPoint slides, verbal discussion, and written materials to support subject matter. The instructor gives an explanation and review of diabetes co-morbidities, pre- and post-prandial blood glucose goals, pre-exercise blood glucose goals, signs, symptoms, and treatment of hypoglycemia and hyperglycemia, and foot care basics.   Diabetes Blitz:  -Group instruction provided by PowerPoint slides, verbal discussion, and written materials to support subject matter. The instructor gives an explanation and review of the physiology behind type 1 and type 2 diabetes, diabetes medications and rational behind using different medications, pre- and post-prandial blood glucose recommendations and Hemoglobin A1c goals, diabetes diet, and exercise including blood glucose guidelines for exercising safely.    Portion Distortion:  -Group instruction provided by PowerPoint slides, verbal discussion, written materials, and food models to support subject matter. The instructor gives an explanation of serving size versus portion size, changes in portions sizes over the last 20 years, and what consists of a serving from each food group.      CARDIAC  REHAB PHASE II EXERCISE from 01/09/2016 in [place]   Date  01/09/16   Educator  RD   Instruction Review Code  2- meets goals/outcomes  Stress Management:  -Group instruction provided by verbal instruction, video, and written materials to support subject matter.  Instructors review role of stress in heart disease and how to cope with stress positively.     Exercising on Your Own:  -Group instruction provided by verbal instruction, power point, and written materials to support subject.  Instructors discuss benefits of exercise, components of exercise, frequency and intensity of exercise, and end points for exercise.  Also discuss use of nitroglycerin and activating EMS.  Review options of places to exercise outside of rehab.  Review guidelines for sex with heart disease.      CARDIAC REHAB PHASE II EXERCISE from 01/09/2016 in [facility]   Date  12/19/15   Instruction Review Code  2- meets goals/outcomes      Cardiac Drugs I:  -Group instruction provided by verbal instruction and written materials to support subject.  Instructor reviews cardiac drug classes: antiplatelets, anticoagulants, beta blockers, and statins.  Instructor discusses reasons, side effects, and lifestyle considerations for each drug class.          CARDIAC REHAB PHASE II EXERCISE from 01/09/2016 in [facility]   Date  12/26/15   Educator  Pharm D   Instruction Review Code  2- meets goals/outcomes      Cardiac Drugs II:  -Group instruction provided by verbal instruction and written materials to support subject.  Instructor reviews cardiac drug classes: angiotensin converting enzyme inhibitors (ACE-I), angiotensin II receptor blockers (ARBs), nitrates, and calcium channel blockers.  Instructor discusses reasons, side effects, and lifestyle considerations for each drug class.   Anatomy and Physiology of the Circulatory  System:  -Group instruction provided by verbal instruction, video, and written materials to support subject.  Reviews functional anatomy of heart, how it relates to various diagnoses, and what role the heart plays in the overall system.   Knowledge Questionnaire Score:     Knowledge Questionnaire Score - 12/13/15 1100    Knowledge Questionnaire Score   Pre Score 24/24      Core Components/Risk Factors/Patient Goals at Admission:     Personal Goals and Risk Factors at Admission - 12/13/15 0754    Core Components/Risk Factors/Patient Goals on Admission    Weight Management Yes;Weight Loss   Intervention Weight Management: Develop a combined nutrition and exercise program designed to reach desired caloric intake, while maintaining appropriate intake of nutrient and fiber, sodium and fats, and appropriate energy expenditure required for the weight goal.;Weight Management: Provide education and appropriate resources to help participant work on and attain dietary goals.   Admit Weight 161 lb 2.5 oz (73.1 kg)   Goal Weight: Short Term 155 lb (70.308 kg)   Goal Weight: Long Term 150 lb (68.04 kg)   Expected Outcomes Short Term: Continue to assess and modify interventions until short term weight is achieved;Long Term: Adherence to nutrition and physical activity/exercise program aimed toward attainment of established weight goal;Weight Loss: Understanding of general recommendations for a balanced deficit meal plan, which promotes 1-2 lb weight loss per week and includes a negative energy balance of [phone] kcal/d;Understanding recommendations for meals to include 15-35% energy as protein, 25-35% energy from fat, 35-60% energy from carbohydrates, less than 200mg  of dietary cholesterol, 20-35 gm of total fiber daily;Understanding of distribution of calorie intake throughout the day with the consumption of 4-5 meals/snacks   Sedentary Yes   Intervention Provide advice, education, support and counseling  about physical activity/exercise needs.;Develop an individualized exercise  prescription for aerobic and resistive training based on initial evaluation findings, risk stratification, comorbidities and participant's personal goals.   Expected Outcomes Achievement of increased cardiorespiratory fitness and enhanced flexibility, muscular endurance and strength shown through measurements of functional capacity and personal statement of participant.   Increase Strength and Stamina Yes   Intervention Provide advice, education, support and counseling about physical activity/exercise needs.;Develop an individualized exercise prescription for aerobic and resistive training based on initial evaluation findings, risk stratification, comorbidities and participant's personal goals.   Expected Outcomes Achievement of increased cardiorespiratory fitness and enhanced flexibility, muscular endurance and strength shown through measurements of functional capacity and personal statement of participant.   Diabetes Yes   Intervention Provide education about signs/symptoms and action to take for hypo/hyperglycemia.;Provide education about proper nutrition, including hydration, and aerobic/resistive exercise prescription along with prescribed medications to achieve blood glucose in normal ranges: Fasting glucose 65-99 mg/dL   Expected Outcomes Short Term: Participant verbalizes understanding of the signs/symptoms and immediate care of hyper/hypoglycemia, proper foot care and importance of medication, aerobic/resistive exercise and nutrition plan for blood glucose control.;Long Term: Attainment of HbA1C < 7%.   Hypertension Yes   Intervention Provide education on lifestyle modifcations including regular physical activity/exercise, weight management, moderate sodium restriction and increased consumption of fresh fruit, vegetables, and low fat dairy, alcohol moderation, and smoking cessation.;Monitor prescription use compliance.    Expected Outcomes Short Term: Continued assessment and intervention until BP is < 140/57mm HG in hypertensive participants. < 130/26mm HG in hypertensive participants with diabetes, heart failure or chronic kidney disease.;Long Term: Maintenance of blood pressure at goal levels.   Lipids Yes   Intervention Provide education and support for participant on nutrition & aerobic/resistive exercise along with prescribed medications to achieve LDL 70mg , HDL >40mg .   Expected Outcomes Short Term: Participant states understanding of desired cholesterol values and is compliant with medications prescribed. Participant is following exercise prescription and nutrition guidelines.;Long Term: Cholesterol controlled with medications as prescribed, with individualized exercise RX and with personalized nutrition plan. Value goals: LDL < 70mg , HDL > 40 mg.   Personal Goal Other Yes   Personal Goal Feel better-Have more energy   Intervention Provide exercise prescriptiona and education for lifestyle changes   Expected Outcomes Able to return to normal activities      Core Components/Risk Factors/Patient Goals Review:    Core Components/Risk Factors/Patient Goals at Discharge (Final Review):    ITP Comments:     ITP Comments      12/13/15 0752           ITP Comments Medical Director-Dr. Fransico Him, MD          Comments:  Pt is making expected progress toward personal goals after completing 7 sessions. Recommend continued exercise and life style modification education including  stress management and relaxation techniques to decrease cardiac risk profile. Cherre Huger, BSN

## 2016-01-09 ENCOUNTER — Encounter (HOSPITAL_COMMUNITY)
Admission: RE | Admit: 2016-01-09 | Discharge: 2016-01-09 | Disposition: A | Discharge status: Home / Self Care | Payer: Medicare Other | Source: Ambulatory Visit | Attending: Cardiology | Admitting: Cardiology

## 2016-01-09 DIAGNOSIS — Z951 Presence of aortocoronary bypass graft: Secondary | ICD-10-CM | POA: Diagnosis not present

## 2016-01-11 ENCOUNTER — Encounter (HOSPITAL_COMMUNITY)
Admission: RE | Admit: 2016-01-11 | Discharge: 2016-01-11 | Disposition: A | Discharge status: Home / Self Care | Payer: Medicare Other | Source: Ambulatory Visit | Attending: Cardiology | Admitting: Cardiology

## 2016-01-11 DIAGNOSIS — Z951 Presence of aortocoronary bypass graft: Secondary | ICD-10-CM

## 2016-01-11 LAB — GLUCOSE, CAPILLARY
GLUCOSE-CAPILLARY: 130 mg/dL — AB (ref 65–99)
GLUCOSE-CAPILLARY: 93 mg/dL (ref 65–99)

## 2016-01-14 ENCOUNTER — Encounter (HOSPITAL_COMMUNITY)
Admission: RE | Admit: 2016-01-14 | Discharge: 2016-01-14 | Disposition: A | Discharge status: Home / Self Care | Payer: Medicare Other | Source: Ambulatory Visit | Attending: Cardiology | Admitting: Cardiology

## 2016-01-14 DIAGNOSIS — Z951 Presence of aortocoronary bypass graft: Secondary | ICD-10-CM | POA: Diagnosis not present

## 2016-01-14 DIAGNOSIS — I2119 ST elevation (STEMI) myocardial infarction involving other coronary artery of inferior wall: Secondary | ICD-10-CM

## 2016-01-14 DIAGNOSIS — I213 ST elevation (STEMI) myocardial infarction of unspecified site: Secondary | ICD-10-CM | POA: Insufficient documentation

## 2016-01-14 NOTE — Progress Notes (Signed)
Reviewed home exercise with pt today.  Pt plans to walking at home for exercise.  Reviewed THR, pulse, RPE, sign and symptoms, NTG use, and when to call 911 or MD.  Also discussed weather considerations and indoor options.  Pt voiced understanding. Alberteen Sam, MA, ACSM RCEP 01/14/2016 11:39 AM

## 2016-01-16 ENCOUNTER — Encounter (HOSPITAL_COMMUNITY)
Admission: RE | Admit: 2016-01-16 | Discharge: 2016-01-16 | Disposition: A | Discharge status: Home / Self Care | Payer: Medicare Other | Source: Ambulatory Visit | Attending: Cardiology | Admitting: Cardiology

## 2016-01-16 DIAGNOSIS — Z951 Presence of aortocoronary bypass graft: Secondary | ICD-10-CM | POA: Diagnosis not present

## 2016-01-18 ENCOUNTER — Encounter (HOSPITAL_COMMUNITY)
Admission: RE | Admit: 2016-01-18 | Discharge: 2016-01-18 | Disposition: A | Discharge status: Home / Self Care | Payer: Medicare Other | Source: Ambulatory Visit | Attending: Cardiology | Admitting: Cardiology

## 2016-01-18 DIAGNOSIS — I2119 ST elevation (STEMI) myocardial infarction involving other coronary artery of inferior wall: Secondary | ICD-10-CM

## 2016-01-18 DIAGNOSIS — Z951 Presence of aortocoronary bypass graft: Secondary | ICD-10-CM

## 2016-01-18 LAB — GLUCOSE, CAPILLARY: Glucose-Capillary: 128 mg/dL — ABNORMAL HIGH (ref 65–99)

## 2016-01-21 ENCOUNTER — Encounter (HOSPITAL_COMMUNITY)
Admission: RE | Admit: 2016-01-21 | Discharge: 2016-01-21 | Disposition: A | Discharge status: Home / Self Care | Payer: Medicare Other | Source: Ambulatory Visit | Attending: Cardiology | Admitting: Cardiology

## 2016-01-21 DIAGNOSIS — I2119 ST elevation (STEMI) myocardial infarction involving other coronary artery of inferior wall: Secondary | ICD-10-CM

## 2016-01-21 DIAGNOSIS — Z951 Presence of aortocoronary bypass graft: Secondary | ICD-10-CM

## 2016-01-21 LAB — GLUCOSE, CAPILLARY: Glucose-Capillary: 167 mg/dL — ABNORMAL HIGH (ref 65–99)

## 2016-01-23 ENCOUNTER — Encounter (HOSPITAL_COMMUNITY)
Admission: RE | Admit: 2016-01-23 | Discharge: 2016-01-23 | Disposition: A | Discharge status: Home / Self Care | Payer: Medicare Other | Source: Ambulatory Visit | Attending: Cardiology | Admitting: Cardiology

## 2016-01-23 DIAGNOSIS — Z951 Presence of aortocoronary bypass graft: Secondary | ICD-10-CM

## 2016-01-23 DIAGNOSIS — I2119 ST elevation (STEMI) myocardial infarction involving other coronary artery of inferior wall: Secondary | ICD-10-CM

## 2016-01-23 DIAGNOSIS — I213 ST elevation (STEMI) myocardial infarction of unspecified site: Secondary | ICD-10-CM

## 2016-01-23 LAB — GLUCOSE, CAPILLARY
Glucose-Capillary: 89 mg/dL (ref 65–99)
Glucose-Capillary: 138 mg/dL — ABNORMAL HIGH (ref 65–99)

## 2016-01-25 ENCOUNTER — Encounter (HOSPITAL_COMMUNITY)
Admission: RE | Admit: 2016-01-25 | Discharge: 2016-01-25 | Disposition: A | Discharge status: Home / Self Care | Payer: Medicare Other | Source: Ambulatory Visit | Attending: Cardiology | Admitting: Cardiology

## 2016-01-25 DIAGNOSIS — Z951 Presence of aortocoronary bypass graft: Secondary | ICD-10-CM | POA: Diagnosis not present

## 2016-01-28 ENCOUNTER — Encounter (HOSPITAL_COMMUNITY)
Admission: RE | Admit: 2016-01-28 | Discharge: 2016-01-28 | Disposition: A | Discharge status: Home / Self Care | Payer: Medicare Other | Source: Ambulatory Visit | Attending: Cardiology | Admitting: Cardiology

## 2016-01-28 DIAGNOSIS — I213 ST elevation (STEMI) myocardial infarction of unspecified site: Secondary | ICD-10-CM

## 2016-01-28 DIAGNOSIS — Z951 Presence of aortocoronary bypass graft: Secondary | ICD-10-CM

## 2016-01-28 LAB — GLUCOSE, CAPILLARY: GLUCOSE-CAPILLARY: 146 mg/dL — AB (ref 65–99)

## 2016-01-30 ENCOUNTER — Encounter (HOSPITAL_COMMUNITY)
Admission: RE | Admit: 2016-01-30 | Discharge: 2016-01-30 | Disposition: A | Discharge status: Home / Self Care | Payer: Medicare Other | Source: Ambulatory Visit | Attending: Cardiology | Admitting: Cardiology

## 2016-01-30 DIAGNOSIS — Z951 Presence of aortocoronary bypass graft: Secondary | ICD-10-CM | POA: Diagnosis not present

## 2016-01-30 DIAGNOSIS — I213 ST elevation (STEMI) myocardial infarction of unspecified site: Secondary | ICD-10-CM

## 2016-01-31 LAB — GLUCOSE, CAPILLARY: GLUCOSE-CAPILLARY: 150 mg/dL — AB (ref 65–99)

## 2016-02-01 ENCOUNTER — Encounter (HOSPITAL_COMMUNITY)
Admission: RE | Admit: 2016-02-01 | Discharge: 2016-02-01 | Disposition: A | Discharge status: Home / Self Care | Payer: Medicare Other | Source: Ambulatory Visit | Attending: Cardiology | Admitting: Cardiology

## 2016-02-01 DIAGNOSIS — Z951 Presence of aortocoronary bypass graft: Secondary | ICD-10-CM | POA: Diagnosis not present

## 2016-02-01 DIAGNOSIS — I213 ST elevation (STEMI) myocardial infarction of unspecified site: Secondary | ICD-10-CM

## 2016-02-01 LAB — GLUCOSE, CAPILLARY
Glucose-Capillary: 104 mg/dL — ABNORMAL HIGH (ref 65–99)
Glucose-Capillary: 68 mg/dL (ref 65–99)

## 2016-02-01 NOTE — Progress Notes (Signed)
Incomplete Session Note  Patient Details  Name: Fernando Hess MRN: [id number] Date of Birth: [birth year] Referring Provider:        CARDIAC REHAB PHASE II EXERCISE from 12/26/2015 in [place]   Referring Provider  Landry Corporal MD      [name] did not complete his rehab session due to pre exercise blood glucose 68.  Pt fasting blood glucose at home was 95.  Pt had waffles and light syrup. Pt took his usual dose of metformin 500 mg.  Advised pt to hold metformin on days of exercise when his fasting blood glucose is less than 105.  Pt given examples of food that has protein for stability of blood glucose.  Pt verbalized understanding. .Pt repeat check 104.  Encouraged pt to eat lunch in the cafeteria.  Pt got a slice of pizza and chocolate milk.  Advised to continue to monitor his blood glucose throughout the day.  Cherre Huger, BSN

## 2016-02-04 ENCOUNTER — Encounter (HOSPITAL_COMMUNITY)
Admission: RE | Admit: 2016-02-04 | Discharge: 2016-02-04 | Disposition: A | Discharge status: Home / Self Care | Payer: Medicare Other | Source: Ambulatory Visit | Attending: Cardiology | Admitting: Cardiology

## 2016-02-04 DIAGNOSIS — I213 ST elevation (STEMI) myocardial infarction of unspecified site: Secondary | ICD-10-CM

## 2016-02-04 DIAGNOSIS — Z951 Presence of aortocoronary bypass graft: Secondary | ICD-10-CM

## 2016-02-04 LAB — GLUCOSE, CAPILLARY: Glucose-Capillary: 122 mg/dL — ABNORMAL HIGH (ref 65–99)

## 2016-02-06 ENCOUNTER — Encounter (HOSPITAL_COMMUNITY)
Admission: RE | Admit: 2016-02-06 | Discharge: 2016-02-06 | Disposition: A | Discharge status: Home / Self Care | Payer: Medicare Other | Source: Ambulatory Visit | Attending: Cardiology | Admitting: Cardiology

## 2016-02-06 DIAGNOSIS — I213 ST elevation (STEMI) myocardial infarction of unspecified site: Secondary | ICD-10-CM

## 2016-02-06 DIAGNOSIS — Z951 Presence of aortocoronary bypass graft: Secondary | ICD-10-CM

## 2016-02-06 DIAGNOSIS — I2119 ST elevation (STEMI) myocardial infarction involving other coronary artery of inferior wall: Secondary | ICD-10-CM

## 2016-02-07 NOTE — Progress Notes (Signed)
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  aspirin 81 MG chewable tablet, Chew 1 tablet (81 mg total) by mouth daily., Disp: , Rfl:  .  atorvastatin (LIPITOR) 40 MG tablet, Take 40 mg by mouth daily., Disp: , Rfl:  .  clopidogrel (PLAVIX) 75 MG tablet, Take 1 tablet (75 mg total) by mouth daily with breakfast., Disp: 30 tablet, Rfl: 12 .  Docusate Calcium (STOOL SOFTENER PO), Take 1 capsule by mouth daily., Disp: , Rfl:  .  glimepiride (AMARYL) 4 MG tablet, Take 4 mg by mouth daily with breakfast. , Disp: , Rfl:  .  iron polysaccharides (NIFEREX) 150 MG capsule, Take 150 mg by mouth daily., Disp: , Rfl:  .  lactobacillus acidophilus (BACID) TABS tablet, Take 2 tablets by mouth daily., Disp: , Rfl:  .  metoprolol succinate (TOPROL-XL) 25 MG 24 hr tablet, Take 25 mg by mouth daily., Disp: , Rfl:  .  Multiple Vitamin (MULTIVITAMIN) tablet, Take 1 tablet by mouth daily., Disp: , Rfl:  .  nitroGLYCERIN (NITROSTAT) 0.4 MG SL tablet, Place 1 tablet (0.4 mg total) under the tongue every 5 (five) minutes x 3 doses as needed for chest pain., Disp: 25 tablet, Rfl: 12 .  ondansetron (ZOFRAN-ODT) 4 MG disintegrating tablet, Take 4 mg by mouth every 8 (eight) hours as needed for nausea or vomiting. Pt  states he takes at home when he feels nauseous, Disp: , Rfl:  .  pantoprazole (PROTONIX) 40 MG tablet, Take 40 mg by mouth daily., Disp: , Rfl:   Past Medical History: Past Medical History  Diagnosis Date  . Diabetes mellitus without complication ([place])   . Hypertension   . Hyperlipidemia   . Kidney stones   . CAD (coronary artery disease), native coronary artery 09/24/2015  . History of kidney stones   . 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 Asthma     Tobacco Use: History  Smoking status  . Never Smoker   Smokeless tobacco  . Never Used    Labs: Recent Review Flowsheet Data    Labs for ITP Cardiac and Pulmonary Rehab Latest Ref Rng 10/03/2015 10/03/2015 10/03/2015 10/22/2015 10/23/2015   Cholestrol 0 - 200 mg/dL - - - - 81   LDLCALC 0 - 99 mg/dL - - - - 31   HDL >40 mg/dL - - - - 30(L)   Trlycerides <150 mg/dL - - - - 102   Hemoglobin A1c 4.8 - 5.6 % - - - 6.6(H) -   PHART 7.350 - 7.450 7.421 7.434 - - -   PCO2ART 35.0 - 45.0 mmHg 33.9(L) 36.4 - - -   HCO3 20.0 - 24.0 mEq/L 22.0 24.3(H) - - -   TCO2 0 -  100 mmol/L 23 25 21 23  -   ACIDBASEDEF 0.0 - 2.0 mmol/L 2.0 - - - -   O2SAT - 92.0 95.0 - - -      Capillary Blood Glucose: Lab Results  Component Value Date   GLUCAP 122* 02/04/2016   GLUCAP 104* 02/01/2016   GLUCAP 68 02/01/2016   GLUCAP 150* 01/30/2016   GLUCAP 146* 01/28/2016     Exercise Target Goals:    Exercise Program Goal: Individual exercise prescription set with THRR, safety & activity barriers. Participant demonstrates ability to understand and report RPE using BORG scale, to self-measure pulse accurately, and to acknowledge the importance of the exercise prescription.  Exercise Prescription Goal: Starting with aerobic activity 30 plus minutes a day, 3 days per week for initial exercise prescription. Provide home exercise prescription and guidelines that participant acknowledges understanding prior to  discharge.  Activity Barriers & Risk Stratification:     Activity Barriers & Cardiac Risk Stratification - 12/13/15 0753    Activity Barriers & Cardiac Risk Stratification   Activity Barriers Deconditioning;Muscular Weakness;Incisional Pain;Other (comment)   Comments Occasional Dizziness   Cardiac Risk Stratification High      6 Minute Walk:     6 Minute Walk      12/13/15 0740       6 Minute Walk   Phase Initial     Distance 1752 feet     Walk Time 6 minutes     # of Rest Breaks 0     MPH 3.32     METS 3.62     RPE 12     VO2 Peak 12.67     Symptoms No     Resting HR 88 bpm     Resting BP 126/66 mmHg     Max Ex. HR 96 bpm     Max Ex. BP 132/72 mmHg     2 Minute Post BP 136/74 mmHg        Initial Exercise Prescription:     Initial Exercise Prescription - 12/27/15 1400    Date of Initial Exercise RX and Referring Provider   Date 12/13/15   Referring Provider Landry Corporal MD   Bike   Level 1   Minutes 10   METs 3.6   NuStep   Level 3   Minutes 10   METs 2   Track   Laps 10   Minutes 10   METs 2.76   Prescription Details   Frequency (times per week) 3   Intensity   THRR 40-80% of Max Heartrate 60-119   Ratings of Perceived Exertion 11-13   Progression   Progression Continue to progress workloads to maintain intensity without signs/symptoms of physical distress.   Resistance Training   Training Prescription Yes   Weight 2lbs   Reps 10-12      Perform Capillary Blood Glucose checks as needed.  Exercise Prescription Changes:     Exercise Prescription Changes      01/01/16 0900 01/14/16 1100 01/15/16 1000 01/30/16 1700     Exercise Review   Progression Yes Yes Yes     Response to Exercise   Blood Pressure (Admit) 120/80 mmHg 120/80 mmHg 122/70 mmHg 98/60 mmHg    Blood Pressure (Exercise) 142/60 mmHg 142/60 mmHg 118/78 mmHg 118/64 mmHg    Blood Pressure (Exit) 112/64 mmHg 112/64 mmHg 110/60 mmHg 100/64 mmHg    Heart Rate (Admit) 80 bpm  80 bpm 92 bpm 85 bpm    Heart Rate (Exercise) 104 bpm  104 bpm 103 bpm 102 bpm    Heart Rate (Exit) 80 bpm 80 bpm 81 bpm 76 bpm    Rating of Perceived Exertion (Exercise) 12 12 12 12     Comments  Home Exercise Given 01/14/16 Home Exercise Given 01/14/16 Home Exercise Given 01/14/16    Duration Progress to 30 minutes of continuous aerobic without signs/symptoms of physical distress Progress to 30 minutes of continuous aerobic without signs/symptoms of physical distress Progress to 30 minutes of continuous aerobic without signs/symptoms of physical distress Progress to 30 minutes of continuous aerobic without signs/symptoms of physical distress    Intensity THRR unchanged THRR unchanged THRR unchanged THRR unchanged    Progression   Progression Continue to progress workloads to maintain intensity without signs/symptoms of physical distress. Continue to progress workloads to maintain intensity without signs/symptoms of physical distress. Continue to progress workloads to maintain intensity without signs/symptoms of physical distress. Continue to progress workloads to maintain intensity without signs/symptoms of physical distress.    Average METs 3 3 3.4 3.2    Resistance Training   Training Prescription Yes Yes Yes Yes    Weight 2lbs 2lbs 2lbs 2lbs    Reps 10-12 10-12 10-12 10-12    Interval Training   Interval Training No No No No    Bike   Level 1 1 1 1     Minutes 10 10 10 10     METs 3.63 3.63 3.62 3.64    NuStep   Level 3 3 3 3     Minutes 10 10 10 10     METs 2.5 2.5 3.2 2.5    Track   Laps 11 11 13 14     Minutes 10 10 10 10     METs 2.91 2.91 3.3 3.44    Home Exercise Plan   Plans to continue exercise at  [facility]  Walking    Frequency  Add 3 additional days to program exercise sessions. Add 3 additional days to program exercise sessions. Add 3 additional days to program exercise sessions.       Exercise Comments:     Exercise Comments      01/01/16 0907  01/15/16 1027 01/30/16 1703       Exercise Comments Pt is tolerating exercise well and continues to make progress. Pt is making good exercise progression. Pt walking 30-45 minutes, 4 days per week in addition to CR. Pt feels energy is improving and is walking faster.        Discharge Exercise Prescription (Final Exercise Prescription Changes):     Exercise Prescription Changes - 01/30/16 1700    Response to Exercise   Blood Pressure (Admit) 98/60 mmHg   Blood Pressure (Exercise) 118/64 mmHg   Blood Pressure (Exit) 100/64 mmHg   Heart Rate (Admit) 85 bpm   Heart Rate (Exercise) 102 bpm   Heart Rate (Exit) 76 bpm   Rating of Perceived Exertion (Exercise) 12   Comments Home Exercise Given 01/14/16   Duration Progress to 30 minutes of continuous aerobic without signs/symptoms of physical distress   Intensity THRR unchanged   Progression   Progression Continue to progress workloads to maintain intensity without signs/symptoms of physical distress.   Average METs 3.2   Resistance Training   Training Prescription Yes   Weight 2lbs   Reps 10-12   Interval Training   Interval Training No   Bike   Level 1   Minutes 10   METs 3.64   NuStep   Level  3   Minutes 10   METs 2.5   Track   Laps 14   Minutes 10   METs 3.44   Home Exercise Plan   Plans to continue exercise at Home  Walking   Frequency Add 3 additional days to program exercise sessions.      Nutrition:  Target Goals: Understanding of nutrition guidelines, daily intake of sodium 1500mg , cholesterol 200mg , calories 30% from fat and 7% or less from saturated fats, daily to have 5 or more servings of fruits and vegetables.  Biometrics:     Pre Biometrics - 12/13/15 1100    Pre Biometrics   Height 5' 5.25" (1.657 m)   Weight 161 lb 2.5 oz (73.1 kg)   Waist Circumference 35 inches   Hip Circumference 36 inches   Waist to Hip Ratio 0.97 %   BMI (Calculated) 26.7   Triceps Skinfold 18 mm   % Body Fat 25.9 %    Grip Strength 39 kg   Flexibility 8 in   Single Leg Stand 2.21 seconds       Nutrition Therapy Plan and Nutrition Goals:     Nutrition Therapy & Goals - 12/13/15 1007    Nutrition Therapy   Diet Diabetic, Therapeutic Lifestyle Changes   Personal Nutrition Goals   Personal Goal #1 Maintain current weight   Intervention Plan   Intervention Prescribe, educate and counsel regarding individualized specific dietary modifications aiming towards targeted core components such as weight, hypertension, lipid management, diabetes, heart failure and other comorbidities.   Expected Outcomes Short Term Goal: Understand basic principles of dietary content, such as calories, fat, sodium, cholesterol and nutrients.;Long Term Goal: Adherence to prescribed nutrition plan.      Nutrition Discharge: Nutrition Scores:     Nutrition Assessments - 01/04/16 1205    MEDFICTS Scores   Pre Score 55      Nutrition Goals Re-Evaluation:   Psychosocial: Target Goals: Acknowledge presence or absence of depression, maximize coping skills, provide positive support system. Participant is able to verbalize types and ability to use techniques and skills needed for reducing stress and depression.  Initial Review & Psychosocial Screening:     Initial Psych Review & Screening - 12/17/15 1537    Initial Review   Current issues with Current Stress Concerns   Source of Stress Concerns Chronic Illness   Family Dynamics   Good Support System? Yes   Concerns Inappropriate over/under dependence on family/friends   Barriers   Psychosocial barriers to participate in program The patient should benefit from training in stress management and relaxation.   Screening Interventions   Interventions Encouraged to exercise      Quality of Life Scores:     Quality of Life - 12/13/15 1101    Quality of Life Scores   Health/Function Pre 22.43 %   Socioeconomic Pre 24.85 %   Psych/Spiritual Pre 23.57 %   Family Pre  22.1 %   GLOBAL Pre 22.97 %      PHQ-9:     Recent Review Flowsheet Data    Depression screen [facility] 2/9 12/17/2015 11/29/2015 09/04/2015 08/09/2014 05/01/2014   Decreased Interest 0 0 0 0 0   Down, Depressed, Hopeless 1 1 - 1 0   PHQ - 2 Score 1 1 0 1 0      Psychosocial Evaluation and Intervention:   Psychosocial Re-Evaluation:   Vocational Rehabilitation: Provide vocational rehab assistance to qualifying candidates.   Vocational Rehab Evaluation & Intervention:  Vocational Rehab - 12/13/15 1421    Initial Vocational Rehab Evaluation & Intervention   Assessment shows need for Vocational Rehabilitation No  Pt indicated that he was retired and did not plan to return to competitive employment.      Education: Education Goals: Education classes will be provided on a weekly basis, covering required topics. Participant will state understanding/return demonstration of topics presented.  Learning Barriers/Preferences:     Learning Barriers/Preferences - 12/13/15 1059    Learning Barriers/Preferences   Learning Barriers Sight  Glasses   Learning Preferences Individual Instruction;Written Material      Education Topics: Count Your Pulse:  -Group instruction provided by verbal instruction, demonstration, patient participation and written materials to support subject.  Instructors address importance of being able to find your pulse and how to count your pulse when at home without a heart monitor.  Patients get hands on experience counting their pulse with staff help and individually.          CARDIAC REHAB PHASE II EXERCISE from 02/06/2016 in [place]   Date  01/18/16   Educator  Andi Hence   Instruction Review Code  R- Review/reinforce [Second class for more practice]      Heart Attack, Angina, and Risk Factor Modification:  -Group instruction provided by verbal instruction, video, and written materials to support subject.  Instructors  address signs and symptoms of angina and heart attacks.    Also discuss risk factors for heart disease and how to make changes to improve heart health risk factors.      CARDIAC REHAB PHASE II EXERCISE from 02/06/2016 in [place]   Date  01/16/16   Instruction Review Code  2- meets goals/outcomes      Functional Fitness:  -Group instruction provided by verbal instruction, demonstration, patient participation, and written materials to support subject.  Instructors address safety measures for doing things around the house.  Discuss how to get up and down off the floor, how to pick things up properly, how to safely get out of a chair without assistance, and balance training.      CARDIAC REHAB PHASE II EXERCISE from 02/06/2016 in [place]   Date  02/01/16   Instruction Review Code  2- meets goals/outcomes      Meditation and Mindfulness:  -Group instruction provided by verbal instruction, patient participation, and written materials to support subject.  Instructor addresses importance of mindfulness and meditation practice to help reduce stress and improve awareness.  Instructor also leads participants through a meditation exercise.       CARDIAC REHAB PHASE II EXERCISE from 02/06/2016 in [place]   Date  02/06/16   Educator  Jeanella Craze   Instruction Review Code  2- meets goals/outcomes      Stretching for Flexibility and Mobility:  -Group instruction provided by verbal instruction, patient participation, and written materials to support subject.  Instructors lead participants through series of stretches that are designed to increase flexibility thus improving mobility.  These stretches are additional exercise for major muscle groups that are typically performed during regular warm up and cool down.   Hands Only CPR Anytime:  -Group instruction provided by verbal instruction, video, patient  participation and written materials to support subject.  Instructors co-teach with AHA video for hands only CPR.  Participants get hands on experience with mannequins.   Nutrition I class: Heart Healthy Eating:  -Group  instruction provided by PowerPoint slides, verbal discussion, and written materials to support subject matter. The instructor gives an explanation and review of the Therapeutic Lifestyle Changes diet recommendations, which includes a discussion on lipid goals, dietary fat, sodium, fiber, plant stanol/sterol esters, sugar, and the components of a well-balanced, healthy diet.   Nutrition II class: Lifestyle Skills:  -Group instruction provided by PowerPoint slides, verbal discussion, and written materials to support subject matter. The instructor gives an explanation and review of label reading, grocery shopping for heart health, heart healthy recipe modifications, and ways to make healthier choices when eating out.   Diabetes Question & Answer:  -Group instruction provided by PowerPoint slides, verbal discussion, and written materials to support subject matter. The instructor gives an explanation and review of diabetes co-morbidities, pre- and post-prandial blood glucose goals, pre-exercise blood glucose goals, signs, symptoms, and treatment of hypoglycemia and hyperglycemia, and foot care basics.      CARDIAC REHAB PHASE II EXERCISE from 02/06/2016 in [place]   Date  01/11/16   Educator  RD   Instruction Review Code  2- meets goals/outcomes      Diabetes Blitz:  -Group instruction provided by PowerPoint slides, verbal discussion, and written materials to support subject matter. The instructor gives an explanation and review of the physiology behind type 1 and type 2 diabetes, diabetes medications and rational behind using different medications, pre- and post-prandial blood glucose recommendations and Hemoglobin A1c goals, diabetes diet, and  exercise including blood glucose guidelines for exercising safely.    Portion Distortion:  -Group instruction provided by PowerPoint slides, verbal discussion, written materials, and food models to support subject matter. The instructor gives an explanation of serving size versus portion size, changes in portions sizes over the last 20 years, and what consists of a serving from each food group.      CARDIAC REHAB PHASE II EXERCISE from 02/06/2016 in [place]   Date  01/09/16   Educator  RD   Instruction Review Code  2- meets goals/outcomes      Stress Management:  -Group instruction provided by verbal instruction, video, and written materials to support subject matter.  Instructors review role of stress in heart disease and how to cope with stress positively.     Exercising on Your Own:  -Group instruction provided by verbal instruction, power point, and written materials to support subject.  Instructors discuss benefits of exercise, components of exercise, frequency and intensity of exercise, and end points for exercise.  Also discuss use of nitroglycerin and activating EMS.  Review options of places to exercise outside of rehab.  Review guidelines for sex with heart disease.      CARDIAC REHAB PHASE II EXERCISE from 02/06/2016 in [place]   Date  12/19/15   Instruction Review Code  2- meets goals/outcomes      Cardiac Drugs I:  -Group instruction provided by verbal instruction and written materials to support subject.  Instructor reviews cardiac drug classes: antiplatelets, anticoagulants, beta blockers, and statins.  Instructor discusses reasons, side effects, and lifestyle considerations for each drug class.      CARDIAC REHAB PHASE II EXERCISE from 02/06/2016 in [place]   Date  12/26/15   Educator  Pharm D   Instruction Review Code  2- meets goals/outcomes      Cardiac Drugs II:   -Group instruction provided by verbal instruction and written materials to  support subject.  Instructor reviews cardiac drug classes: angiotensin converting enzyme inhibitors (ACE-I), angiotensin II receptor blockers (ARBs), nitrates, and calcium channel blockers.  Instructor discusses reasons, side effects, and lifestyle considerations for each drug class.      CARDIAC REHAB PHASE II EXERCISE from 02/06/2016 in [place]   Date  01/23/16   Educator  Pharm D   Instruction Review Code  2- meets goals/outcomes      Anatomy and Physiology of the Circulatory System:  -Group instruction provided by verbal instruction, video, and written materials to support subject.  Reviews functional anatomy of heart, how it relates to various diagnoses, and what role the heart plays in the overall system.      CARDIAC REHAB PHASE II EXERCISE from 02/06/2016 in [place]   Date  01/30/16   Instruction Review Code  2- meets goals/outcomes      Knowledge Questionnaire Score:     Knowledge Questionnaire Score - 12/13/15 1100    Knowledge Questionnaire Score   Pre Score 24/24      Core Components/Risk Factors/Patient Goals at Admission:     Personal Goals and Risk Factors at Admission - 12/13/15 0754    Core Components/Risk Factors/Patient Goals on Admission    Weight Management Yes;Weight Loss   Intervention Weight Management: Develop a combined nutrition and exercise program designed to reach desired caloric intake, while maintaining appropriate intake of nutrient and fiber, sodium and fats, and appropriate energy expenditure required for the weight goal.;Weight Management: Provide education and appropriate resources to help participant work on and attain dietary goals.   Admit Weight 161 lb 2.5 oz (73.1 kg)   Goal Weight: Short Term 155 lb (70.308 kg)   Goal Weight: Long Term 150 lb (68.04 kg)   Expected Outcomes Short Term: Continue to  assess and modify interventions until short term weight is achieved;Long Term: Adherence to nutrition and physical activity/exercise program aimed toward attainment of established weight goal;Weight Loss: Understanding of general recommendations for a balanced deficit meal plan, which promotes 1-2 lb weight loss per week and includes a negative energy balance of [phone] kcal/d;Understanding recommendations for meals to include 15-35% energy as protein, 25-35% energy from fat, 35-60% energy from carbohydrates, less than 200mg  of dietary cholesterol, 20-35 gm of total fiber daily;Understanding of distribution of calorie intake throughout the day with the consumption of 4-5 meals/snacks   Sedentary Yes   Intervention Provide advice, education, support and counseling about physical activity/exercise needs.;Develop an individualized exercise prescription for aerobic and resistive training based on initial evaluation findings, risk stratification, comorbidities and participant's personal goals.   Expected Outcomes Achievement of increased cardiorespiratory fitness and enhanced flexibility, muscular endurance and strength shown through measurements of functional capacity and personal statement of participant.   Increase Strength and Stamina Yes   Intervention Provide advice, education, support and counseling about physical activity/exercise needs.;Develop an individualized exercise prescription for aerobic and resistive training based on initial evaluation findings, risk stratification, comorbidities and participant's personal goals.   Expected Outcomes Achievement of increased cardiorespiratory fitness and enhanced flexibility, muscular endurance and strength shown through measurements of functional capacity and personal statement of participant.   Diabetes Yes   Intervention Provide education about signs/symptoms and action to take for hypo/hyperglycemia.;Provide education about proper nutrition, including  hydration, and aerobic/resistive exercise prescription along with prescribed medications to achieve blood glucose in normal ranges: Fasting glucose 65-99 mg/dL   Expected Outcomes Short Term: Participant verbalizes understanding of  the signs/symptoms and immediate care of hyper/hypoglycemia, proper foot care and importance of medication, aerobic/resistive exercise and nutrition plan for blood glucose control.;Long Term: Attainment of HbA1C < 7%.   Hypertension Yes   Intervention Provide education on lifestyle modifcations including regular physical activity/exercise, weight management, moderate sodium restriction and increased consumption of fresh fruit, vegetables, and low fat dairy, alcohol moderation, and smoking cessation.;Monitor prescription use compliance.   Expected Outcomes Short Term: Continued assessment and intervention until BP is < 140/58mm HG in hypertensive participants. < 130/74mm HG in hypertensive participants with diabetes, heart failure or chronic kidney disease.;Long Term: Maintenance of blood pressure at goal levels.   Lipids Yes   Intervention Provide education and support for participant on nutrition & aerobic/resistive exercise along with prescribed medications to achieve LDL 70mg , HDL >40mg .   Expected Outcomes Short Term: Participant states understanding of desired cholesterol values and is compliant with medications prescribed. Participant is following exercise prescription and nutrition guidelines.;Long Term: Cholesterol controlled with medications as prescribed, with individualized exercise RX and with personalized nutrition plan. Value goals: LDL < 70mg , HDL > 40 mg.   Personal Goal Other Yes   Personal Goal Feel better-Have more energy   Intervention Provide exercise prescriptiona and education for lifestyle changes   Expected Outcomes Able to return to normal activities      Core Components/Risk Factors/Patient Goals Review:      Goals and Risk Factor Review       01/30/16 1705           Core Components/Risk Factors/Patient Goals Review   Personal Goals Review Weight Management/Obesity;Sedentary;Increase Strength and Stamina;Other       Review Pt is close to short-term wt loss goal. Currently wt is 157.5 lbs. Pt is walking 4 days/ week at home. Pt feels energy is improving and feels he is able to walk faster.       Expected Outcomes Continue home exercise program and maintain wt loss.          Core Components/Risk Factors/Patient Goals at Discharge (Final Review):      Goals and Risk Factor Review - 01/30/16 1705    Core Components/Risk Factors/Patient Goals Review   Personal Goals Review Weight Management/Obesity;Sedentary;Increase Strength and Stamina;Other   Review Pt is close to short-term wt loss goal. Currently wt is 157.5 lbs. Pt is walking 4 days/ week at home. Pt feels energy is improving and feels he is able to walk faster.   Expected Outcomes Continue home exercise program and maintain wt loss.      ITP Comments:     ITP Comments      12/13/15 0752           ITP Comments Medical Director-Dr. Fransico Him, MD          Comments:  Pt is making expected progress toward personal goals after completing 19 sessions. Recommend continued exercise and life style modification education including  stress management and relaxation techniques to decrease cardiac risk profile. Cherre Huger, BSN

## 2016-02-08 ENCOUNTER — Encounter (HOSPITAL_COMMUNITY)
Admission: RE | Admit: 2016-02-08 | Discharge: 2016-02-08 | Disposition: A | Discharge status: Home / Self Care | Payer: Medicare Other | Source: Ambulatory Visit | Attending: Cardiology | Admitting: Cardiology

## 2016-02-08 DIAGNOSIS — Z951 Presence of aortocoronary bypass graft: Secondary | ICD-10-CM | POA: Diagnosis not present

## 2016-02-08 DIAGNOSIS — I2119 ST elevation (STEMI) myocardial infarction involving other coronary artery of inferior wall: Secondary | ICD-10-CM

## 2016-02-08 DIAGNOSIS — I213 ST elevation (STEMI) myocardial infarction of unspecified site: Secondary | ICD-10-CM

## 2016-02-13 ENCOUNTER — Encounter (HOSPITAL_COMMUNITY)
Admission: RE | Admit: 2016-02-13 | Discharge: 2016-02-13 | Disposition: A | Discharge status: Home / Self Care | Payer: Medicare Other | Source: Ambulatory Visit | Attending: Cardiology | Admitting: Cardiology

## 2016-02-13 DIAGNOSIS — I213 ST elevation (STEMI) myocardial infarction of unspecified site: Secondary | ICD-10-CM

## 2016-02-13 DIAGNOSIS — Z951 Presence of aortocoronary bypass graft: Secondary | ICD-10-CM

## 2016-02-15 ENCOUNTER — Encounter (HOSPITAL_COMMUNITY)
Admission: RE | Admit: 2016-02-15 | Discharge: 2016-02-15 | Disposition: A | Discharge status: Home / Self Care | Payer: Medicare Other | Source: Ambulatory Visit | Attending: Cardiology | Admitting: Cardiology

## 2016-02-15 DIAGNOSIS — I213 ST elevation (STEMI) myocardial infarction of unspecified site: Secondary | ICD-10-CM | POA: Insufficient documentation

## 2016-02-15 DIAGNOSIS — Z951 Presence of aortocoronary bypass graft: Secondary | ICD-10-CM | POA: Insufficient documentation

## 2016-02-18 ENCOUNTER — Encounter (HOSPITAL_COMMUNITY)
Admission: RE | Admit: 2016-02-18 | Discharge: 2016-02-18 | Disposition: A | Discharge status: Home / Self Care | Payer: Medicare Other | Source: Ambulatory Visit | Attending: Cardiology | Admitting: Cardiology

## 2016-02-18 DIAGNOSIS — Z951 Presence of aortocoronary bypass graft: Secondary | ICD-10-CM | POA: Diagnosis not present

## 2016-02-18 DIAGNOSIS — I213 ST elevation (STEMI) myocardial infarction of unspecified site: Secondary | ICD-10-CM

## 2016-02-20 ENCOUNTER — Encounter (HOSPITAL_COMMUNITY)
Admission: RE | Admit: 2016-02-20 | Discharge: 2016-02-20 | Disposition: A | Discharge status: Home / Self Care | Payer: Medicare Other | Source: Ambulatory Visit | Attending: Cardiology | Admitting: Cardiology

## 2016-02-20 DIAGNOSIS — Z951 Presence of aortocoronary bypass graft: Secondary | ICD-10-CM

## 2016-02-20 DIAGNOSIS — I213 ST elevation (STEMI) myocardial infarction of unspecified site: Secondary | ICD-10-CM

## 2016-02-20 DIAGNOSIS — I2119 ST elevation (STEMI) myocardial infarction involving other coronary artery of inferior wall: Secondary | ICD-10-CM

## 2016-02-20 LAB — GLUCOSE, CAPILLARY
GLUCOSE-CAPILLARY: 109 mg/dL — AB (ref 65–99)
GLUCOSE-CAPILLARY: 122 mg/dL — AB (ref 65–99)

## 2016-02-22 ENCOUNTER — Encounter (HOSPITAL_COMMUNITY)
Admission: RE | Admit: 2016-02-22 | Discharge: 2016-02-22 | Disposition: A | Discharge status: Home / Self Care | Payer: Medicare Other | Source: Ambulatory Visit | Attending: Cardiology | Admitting: Cardiology

## 2016-02-22 DIAGNOSIS — I213 ST elevation (STEMI) myocardial infarction of unspecified site: Secondary | ICD-10-CM

## 2016-02-22 DIAGNOSIS — Z951 Presence of aortocoronary bypass graft: Secondary | ICD-10-CM | POA: Diagnosis not present

## 2016-02-25 ENCOUNTER — Encounter (HOSPITAL_COMMUNITY)
Admission: RE | Admit: 2016-02-25 | Discharge: 2016-02-25 | Disposition: A | Discharge status: Home / Self Care | Payer: Medicare Other | Source: Ambulatory Visit | Attending: Cardiology | Admitting: Cardiology

## 2016-02-25 DIAGNOSIS — Z951 Presence of aortocoronary bypass graft: Secondary | ICD-10-CM

## 2016-02-25 DIAGNOSIS — I213 ST elevation (STEMI) myocardial infarction of unspecified site: Secondary | ICD-10-CM

## 2016-02-27 ENCOUNTER — Encounter (HOSPITAL_COMMUNITY)
Admission: RE | Admit: 2016-02-27 | Discharge: 2016-02-27 | Disposition: A | Discharge status: Home / Self Care | Payer: Medicare Other | Source: Ambulatory Visit | Attending: Cardiology | Admitting: Cardiology

## 2016-02-27 DIAGNOSIS — Z951 Presence of aortocoronary bypass graft: Secondary | ICD-10-CM

## 2016-02-29 ENCOUNTER — Encounter (HOSPITAL_COMMUNITY)
Admission: RE | Admit: 2016-02-29 | Discharge: 2016-02-29 | Disposition: A | Discharge status: Home / Self Care | Payer: Medicare Other | Source: Ambulatory Visit | Attending: Cardiology | Admitting: Cardiology

## 2016-02-29 DIAGNOSIS — I213 ST elevation (STEMI) myocardial infarction of unspecified site: Secondary | ICD-10-CM

## 2016-02-29 DIAGNOSIS — Z951 Presence of aortocoronary bypass graft: Secondary | ICD-10-CM | POA: Diagnosis not present

## 2016-03-03 ENCOUNTER — Encounter (HOSPITAL_COMMUNITY)
Admission: RE | Admit: 2016-03-03 | Discharge: 2016-03-03 | Disposition: A | Discharge status: Home / Self Care | Payer: Medicare Other | Source: Ambulatory Visit | Attending: Cardiology | Admitting: Cardiology

## 2016-03-03 DIAGNOSIS — I213 ST elevation (STEMI) myocardial infarction of unspecified site: Secondary | ICD-10-CM

## 2016-03-03 DIAGNOSIS — Z951 Presence of aortocoronary bypass graft: Secondary | ICD-10-CM | POA: Diagnosis not present

## 2016-03-05 ENCOUNTER — Encounter (HOSPITAL_COMMUNITY)
Admission: RE | Admit: 2016-03-05 | Discharge: 2016-03-05 | Disposition: A | Discharge status: Home / Self Care | Payer: Medicare Other | Source: Ambulatory Visit | Attending: Cardiology | Admitting: Cardiology

## 2016-03-05 DIAGNOSIS — Z951 Presence of aortocoronary bypass graft: Secondary | ICD-10-CM

## 2016-03-05 DIAGNOSIS — I213 ST elevation (STEMI) myocardial infarction of unspecified site: Secondary | ICD-10-CM

## 2016-03-06 NOTE — Progress Notes (Signed)
Cardiac Individual Treatment Plan  Patient Details  Name: Fernando Hess MRN: [id number] Date of Birth: 1946/02/04 Referring Provider:        CARDIAC REHAB PHASE II EXERCISE from 12/26/2015 in [place]   Referring Provider  Landry Corporal MD      Initial Encounter Date:       CARDIAC REHAB PHASE II EXERCISE from 12/26/2015 in [place]   Date  12/13/15   Referring Provider  Landry Corporal MD      Visit Diagnosis: S/P CABG x 5  ST elevation (STEMI) myocardial infarction of unspecified site [facility])  Patient's Home Medications on Admission:  Current outpatient prescriptions:  .  aspirin 81 MG chewable tablet, Chew 1 tablet (81 mg total) by mouth daily., Disp: , Rfl:  .  atorvastatin (LIPITOR) 40 MG tablet, Take 40 mg by mouth daily., Disp: , Rfl:  .  clopidogrel (PLAVIX) 75 MG tablet, Take 1 tablet (75 mg total) by mouth daily with breakfast., Disp: 30 tablet, Rfl: 12 .  Docusate Calcium (STOOL SOFTENER PO), Take 1 capsule by mouth daily., Disp: , Rfl:  .  glimepiride (AMARYL) 4 MG tablet, Take 4 mg by mouth daily with breakfast. , Disp: , Rfl:  .  iron polysaccharides (NIFEREX) 150 MG capsule, Take 150 mg by mouth daily., Disp: , Rfl:  .  lactobacillus acidophilus (BACID) TABS tablet, Take 2 tablets by mouth daily., Disp: , Rfl:  .  metoprolol succinate (TOPROL-XL) 25 MG 24 hr tablet, Take 25 mg by mouth daily., Disp: , Rfl:  .  Multiple Vitamin (MULTIVITAMIN) tablet, Take 1 tablet by mouth daily., Disp: , Rfl:  .  nitroGLYCERIN (NITROSTAT) 0.4 MG SL tablet, Place 1 tablet (0.4 mg total) under the tongue every 5 (five) minutes x 3 doses as needed for chest pain., Disp: 25 tablet, Rfl: 12 .  ondansetron (ZOFRAN-ODT) 4 MG disintegrating tablet, Take 4 mg by mouth every 8 (eight) hours as needed for nausea or vomiting. Pt states he takes at home when he feels nauseous, Disp: , Rfl:  .  pantoprazole (PROTONIX) 40 MG tablet,  Take 40 mg by mouth daily., Disp: , Rfl:   Past Medical History: Past Medical History  Diagnosis Date  . Diabetes mellitus without complication ([place])   . Hypertension   . Hyperlipidemia   . Kidney stones   . CAD (coronary artery disease), native coronary artery 09/24/2015  . History of kidney stones   . 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 Asthma     Tobacco Use: History  Smoking status  . Never Smoker   Smokeless tobacco  . Never Used    Labs: Recent Review Flowsheet Data    Labs for ITP Cardiac and Pulmonary Rehab Latest Ref Rng 10/03/2015 10/03/2015 10/03/2015 10/22/2015 10/23/2015   Cholestrol 0 - 200 mg/dL - - - - 81   LDLCALC 0 - 99 mg/dL - - - - 31   HDL >40 mg/dL - - - - 30(L)   Trlycerides <150 mg/dL - - - - 102   Hemoglobin A1c 4.8 - 5.6 % - - - 6.6(H) -   PHART 7.350 - 7.450 7.421 7.434 - - -   PCO2ART 35.0 - 45.0 mmHg 33.9(L) 36.4 - - -   HCO3 20.0 - 24.0 mEq/L 22.0 24.3(H) - - -   TCO2 0 - 100 mmol/L 23 25 21 23  -   ACIDBASEDEF 0.0 - 2.0  mmol/L 2.0 - - - -   O2SAT - 92.0 95.0 - - -      Capillary Blood Glucose: Lab Results  Component Value Date   GLUCAP 122* 02/20/2016   GLUCAP 109* 02/20/2016   GLUCAP 122* 02/04/2016   GLUCAP 104* 02/01/2016   GLUCAP 68 02/01/2016     Exercise Target Goals:    Exercise Program Goal: Individual exercise prescription set with THRR, safety & activity barriers. Participant demonstrates ability to understand and report RPE using BORG scale, to self-measure pulse accurately, and to acknowledge the importance of the exercise prescription.  Exercise Prescription Goal: Starting with aerobic activity 30 plus minutes a day, 3 days per week for initial exercise prescription. Provide home exercise prescription and guidelines that participant acknowledges understanding prior to discharge.  Activity Barriers & Risk Stratification:     Activity Barriers & Cardiac Risk Stratification -  12/13/15 0753    Activity Barriers & Cardiac Risk Stratification   Activity Barriers Deconditioning;Muscular Weakness;Incisional Pain;Other (comment)   Comments Occasional Dizziness   Cardiac Risk Stratification High      6 Minute Walk:     6 Minute Walk      12/13/15 0740       6 Minute Walk   Phase Initial     Distance 1752 feet     Walk Time 6 minutes     # of Rest Breaks 0     MPH 3.32     METS 3.62     RPE 12     VO2 Peak 12.67     Symptoms No     Resting HR 88 bpm     Resting BP 126/66 mmHg     Max Ex. HR 96 bpm     Max Ex. BP 132/72 mmHg     2 Minute Post BP 136/74 mmHg        Initial Exercise Prescription:     Initial Exercise Prescription - 12/27/15 1400    Date of Initial Exercise RX and Referring Provider   Date 12/13/15   Referring Provider Landry Corporal MD   Bike   Level 1   Minutes 10   METs 3.6   NuStep   Level 3   Minutes 10   METs 2   Track   Laps 10   Minutes 10   METs 2.76   Prescription Details   Frequency (times per week) 3   Intensity   THRR 40-80% of Max Heartrate 60-119   Ratings of Perceived Exertion 11-13   Progression   Progression Continue to progress workloads to maintain intensity without signs/symptoms of physical distress.   Resistance Training   Training Prescription Yes   Weight 2lbs   Reps 10-12      Perform Capillary Blood Glucose checks as needed.  Exercise Prescription Changes:     Exercise Prescription Changes      01/01/16 0900 01/14/16 1100 01/15/16 1000 01/30/16 1700 02/15/16 1700   Exercise Review   Progression Yes Yes Yes     Response to Exercise   Blood Pressure (Admit) 120/80 mmHg 120/80 mmHg 122/70 mmHg 98/60 mmHg 114/74 mmHg   Blood Pressure (Exercise) 142/60 mmHg 142/60 mmHg 118/78 mmHg 118/64 mmHg 150/78 mmHg   Blood Pressure (Exit) 112/64 mmHg 112/64 mmHg 110/60 mmHg 100/64 mmHg 116/60 mmHg   Heart Rate (Admit) 80 bpm 80 bpm 92 bpm 85 bpm 87 bpm   Heart Rate (Exercise) 104 bpm 104 bpm  103 bpm 102 bpm 115 bpm  Heart Rate (Exit) 80 bpm 80 bpm 81 bpm 76 bpm 86 bpm   Rating of Perceived Exertion (Exercise) 12 12 12 12 13    Comments  Home Exercise Given 01/14/16 Home Exercise Given 01/14/16 Home Exercise Given 01/14/16 Home Exercise Given 01/14/16   Duration Progress to 30 minutes of continuous aerobic without signs/symptoms of physical distress Progress to 30 minutes of continuous aerobic without signs/symptoms of physical distress Progress to 30 minutes of continuous aerobic without signs/symptoms of physical distress Progress to 30 minutes of continuous aerobic without signs/symptoms of physical distress Progress to 30 minutes of continuous aerobic without signs/symptoms of physical distress   Intensity THRR unchanged THRR unchanged THRR unchanged THRR unchanged THRR unchanged   Progression   Progression Continue to progress workloads to maintain intensity without signs/symptoms of physical distress. Continue to progress workloads to maintain intensity without signs/symptoms of physical distress. Continue to progress workloads to maintain intensity without signs/symptoms of physical distress. Continue to progress workloads to maintain intensity without signs/symptoms of physical distress. Continue to progress workloads to maintain intensity without signs/symptoms of physical distress.   Average METs 3 3 3.4 3.2 3.2   Resistance Training   Training Prescription Yes Yes Yes Yes Yes   Weight 2lbs 2lbs 2lbs 2lbs 2lbs   Reps 10-12 10-12 10-12 10-12 10-12   Interval Training   Interval Training No No No No No   Bike   Level 1 1 1 1 1    Minutes 10 10 10 10 10    METs 3.63 3.63 3.62 3.64 3.64   NuStep   Level 3 3 3 3 3    Minutes 10 10 10 10 10    METs 2.5 2.5 3.2 2.5 2.6   Track   Laps 11 11 13 14 14    Minutes 10 10 10 10 10    METs 2.91 2.91 3.3 3.44 3.43   Home Exercise Plan   Plans to continue exercise at  [place]  Walking   Frequency  Add 3  additional days to program exercise sessions. Add 3 additional days to program exercise sessions. Add 3 additional days to program exercise sessions. Add 3 additional days to program exercise sessions.     02/29/16 1100           Exercise Review   Progression Yes       Response to Exercise   Blood Pressure (Admit) 100/70 mmHg       Blood Pressure (Exercise) 128/82 mmHg       Blood Pressure (Exit) 118/78 mmHg       Heart Rate (Admit) 82 bpm       Heart Rate (Exercise) 115 bpm       Heart Rate (Exit) 81 bpm       Rating of Perceived Exertion (Exercise) 13       Comments Home Exercise Given 01/14/16       Duration Progress to 30 minutes of continuous aerobic without signs/symptoms of physical distress       Intensity THRR unchanged       Progression   Progression Continue to progress workloads to maintain intensity without signs/symptoms of physical distress.       Average METs 3.4       Resistance Training   Training Prescription Yes       Weight 3 lbs.       Reps 10-12       Interval Training   Interval Training No  Bike   Level 1       Minutes 10       METs 3.64       NuStep   Level 3       Minutes 10       METs 2.7       Track   Laps 16       Minutes 10       METs 3.79       Home Exercise Plan   Plans to continue exercise at Home  Walking       Frequency Add 3 additional days to program exercise sessions.          Exercise Comments:     Exercise Comments      01/01/16 0907 01/15/16 1027 01/30/16 1703 02/15/16 1723 02/29/16 0903   Exercise Comments Pt is tolerating exercise well and continues to make progress. Pt is making good exercise progression. Pt walking 30-45 minutes, 4 days per week in addition to CR. Pt feels energy is improving and is walking faster. Doing well with exercise, increasing workloads as tolerated. Pt exercising daily, increasing workloads as tolerated.      Discharge Exercise Prescription (Final Exercise Prescription Changes):      Exercise Prescription Changes - 02/29/16 1100    Exercise Review   Progression Yes   Response to Exercise   Blood Pressure (Admit) 100/70 mmHg   Blood Pressure (Exercise) 128/82 mmHg   Blood Pressure (Exit) 118/78 mmHg   Heart Rate (Admit) 82 bpm   Heart Rate (Exercise) 115 bpm   Heart Rate (Exit) 81 bpm   Rating of Perceived Exertion (Exercise) 13   Comments Home Exercise Given 01/14/16   Duration Progress to 30 minutes of continuous aerobic without signs/symptoms of physical distress   Intensity THRR unchanged   Progression   Progression Continue to progress workloads to maintain intensity without signs/symptoms of physical distress.   Average METs 3.4   Resistance Training   Training Prescription Yes   Weight 3 lbs.   Reps 10-12   Interval Training   Interval Training No   Bike   Level 1   Minutes 10   METs 3.64   NuStep   Level 3   Minutes 10   METs 2.7   Track   Laps 16   Minutes 10   METs 3.79   Home Exercise Plan   Plans to continue exercise at Home  Walking   Frequency Add 3 additional days to program exercise sessions.      Nutrition:  Target Goals: Understanding of nutrition guidelines, daily intake of sodium 1500mg , cholesterol 200mg , calories 30% from fat and 7% or less from saturated fats, daily to have 5 or more servings of fruits and vegetables.  Biometrics:     Pre Biometrics - 12/13/15 1100    Pre Biometrics   Height 5' 5.25" (1.657 m)   Weight 161 lb 2.5 oz (73.1 kg)   Waist Circumference 35 inches   Hip Circumference 36 inches   Waist to Hip Ratio 0.97 %   BMI (Calculated) 26.7   Triceps Skinfold 18 mm   % Body Fat 25.9 %   Grip Strength 39 kg   Flexibility 8 in   Single Leg Stand 2.21 seconds       Nutrition Therapy Plan and Nutrition Goals:     Nutrition Therapy & Goals - 12/13/15 1007    Nutrition Therapy   Diet Diabetic, Therapeutic Lifestyle Changes   Personal  Nutrition Goals   Personal Goal #1 Maintain current weight    Intervention Plan   Intervention Prescribe, educate and counsel regarding individualized specific dietary modifications aiming towards targeted core components such as weight, hypertension, lipid management, diabetes, heart failure and other comorbidities.   Expected Outcomes Short Term Goal: Understand basic principles of dietary content, such as calories, fat, sodium, cholesterol and nutrients.;Long Term Goal: Adherence to prescribed nutrition plan.      Nutrition Discharge: Nutrition Scores:     Nutrition Assessments - 01/04/16 1205    MEDFICTS Scores   Pre Score 55      Nutrition Goals Re-Evaluation:   Psychosocial: Target Goals: Acknowledge presence or absence of depression, maximize coping skills, provide positive support system. Participant is able to verbalize types and ability to use techniques and skills needed for reducing stress and depression.  Initial Review & Psychosocial Screening:     Initial Psych Review & Screening - 12/17/15 1537    Initial Review   Current issues with Current Stress Concerns   Source of Stress Concerns Chronic Illness   Family Dynamics   Good Support System? Yes   Concerns Inappropriate over/under dependence on family/friends   Barriers   Psychosocial barriers to participate in program The patient should benefit from training in stress management and relaxation.   Screening Interventions   Interventions Encouraged to exercise      Quality of Life Scores:     Quality of Life - 12/13/15 1101    Quality of Life Scores   Health/Function Pre 22.43 %   Socioeconomic Pre 24.85 %   Psych/Spiritual Pre 23.57 %   Family Pre 22.1 %   GLOBAL Pre 22.97 %      PHQ-9:     Recent Review Flowsheet Data    Depression screen [facility] 2/9 12/17/2015 11/29/2015 09/04/2015 08/09/2014 05/01/2014   Decreased Interest 0 0 0 0 0   Down, Depressed, Hopeless 1 1 - 1 0   PHQ - 2 Score 1 1 0 1 0      Psychosocial Evaluation and  Intervention:   Psychosocial Re-Evaluation:   Vocational Rehabilitation: Provide vocational rehab assistance to qualifying candidates.   Vocational Rehab Evaluation & Intervention:     Vocational Rehab - 12/13/15 1421    Initial Vocational Rehab Evaluation & Intervention   Assessment shows need for Vocational Rehabilitation No  Pt indicated that he was retired and did not plan to return to competitive employment.      Education: Education Goals: Education classes will be provided on a weekly basis, covering required topics. Participant will state understanding/return demonstration of topics presented.  Learning Barriers/Preferences:     Learning Barriers/Preferences - 12/13/15 1059    Learning Barriers/Preferences   Learning Barriers Sight  Glasses   Learning Preferences Individual Instruction;Written Material      Education Topics: Count Your Pulse:  -Group instruction provided by verbal instruction, demonstration, patient participation and written materials to support subject.  Instructors address importance of being able to find your pulse and how to count your pulse when at home without a heart monitor.  Patients get hands on experience counting their pulse with staff help and individually.          CARDIAC REHAB PHASE II EXERCISE from 03/05/2016 in [place]   Date  02/15/16   Educator  Andi Hence   Instruction Review Code  2- meets goals/outcomes [Second class for more practice]      Heart Attack, Angina,  and Risk Factor Modification:  -Group instruction provided by verbal instruction, video, and written materials to support subject.  Instructors address signs and symptoms of angina and heart attacks.    Also discuss risk factors for heart disease and how to make changes to improve heart health risk factors.      CARDIAC REHAB PHASE II EXERCISE from 03/05/2016 in [place]   Date  01/16/16    Instruction Review Code  2- meets goals/outcomes      Functional Fitness:  -Group instruction provided by verbal instruction, demonstration, patient participation, and written materials to support subject.  Instructors address safety measures for doing things around the house.  Discuss how to get up and down off the floor, how to pick things up properly, how to safely get out of a chair without assistance, and balance training.      CARDIAC REHAB PHASE II EXERCISE from 03/05/2016 in [place]   Date  02/01/16   Instruction Review Code  2- meets goals/outcomes      Meditation and Mindfulness:  -Group instruction provided by verbal instruction, patient participation, and written materials to support subject.  Instructor addresses importance of mindfulness and meditation practice to help reduce stress and improve awareness.  Instructor also leads participants through a meditation exercise.       CARDIAC REHAB PHASE II EXERCISE from 03/05/2016 in [place]   Date  02/06/16   Educator  Jeanella Craze   Instruction Review Code  2- meets goals/outcomes      Stretching for Flexibility and Mobility:  -Group instruction provided by verbal instruction, patient participation, and written materials to support subject.  Instructors lead participants through series of stretches that are designed to increase flexibility thus improving mobility.  These stretches are additional exercise for major muscle groups that are typically performed during regular warm up and cool down.      CARDIAC REHAB PHASE II EXERCISE from 03/05/2016 in [place]   Date  02/08/16   Educator  Luetta Nutting Iline Oven   Instruction Review Code  2- meets goals/outcomes      Hands Only CPR Anytime:  -Group instruction provided by verbal instruction, video, patient participation and written materials to support subject.  Instructors  co-teach with AHA video for hands only CPR.  Participants get hands on experience with mannequins.      CARDIAC REHAB PHASE II EXERCISE from 03/05/2016 in [place]   Date  02/29/16   Instruction Review Code  2- meets goals/outcomes      Nutrition I class: Heart Healthy Eating:  -Group instruction provided by PowerPoint slides, verbal discussion, and written materials to support subject matter. The instructor gives an explanation and review of the Therapeutic Lifestyle Changes diet recommendations, which includes a discussion on lipid goals, dietary fat, sodium, fiber, plant stanol/sterol esters, sugar, and the components of a well-balanced, healthy diet.   Nutrition II class: Lifestyle Skills:  -Group instruction provided by PowerPoint slides, verbal discussion, and written materials to support subject matter. The instructor gives an explanation and review of label reading, grocery shopping for heart health, heart healthy recipe modifications, and ways to make healthier choices when eating out.   Diabetes Question & Answer:  -Group instruction provided by PowerPoint slides, verbal discussion, and written materials to support subject matter. The instructor gives an explanation and review of diabetes co-morbidities, pre- and post-prandial blood  glucose goals, pre-exercise blood glucose goals, signs, symptoms, and treatment of hypoglycemia and hyperglycemia, and foot care basics.      CARDIAC REHAB PHASE II EXERCISE from 03/05/2016 in [place]   Date  01/11/16   Educator  RD   Instruction Review Code  2- meets goals/outcomes      Diabetes Blitz:  -Group instruction provided by PowerPoint slides, verbal discussion, and written materials to support subject matter. The instructor gives an explanation and review of the physiology behind type 1 and type 2 diabetes, diabetes medications and rational behind using different medications,  pre- and post-prandial blood glucose recommendations and Hemoglobin A1c goals, diabetes diet, and exercise including blood glucose guidelines for exercising safely.    Portion Distortion:  -Group instruction provided by PowerPoint slides, verbal discussion, written materials, and food models to support subject matter. The instructor gives an explanation of serving size versus portion size, changes in portions sizes over the last 20 years, and what consists of a serving from each food group.      CARDIAC REHAB PHASE II EXERCISE from 03/05/2016 in [place]   Date  01/09/16   Educator  RD   Instruction Review Code  2- meets goals/outcomes      Stress Management:  -Group instruction provided by verbal instruction, video, and written materials to support subject matter.  Instructors review role of stress in heart disease and how to cope with stress positively.        CARDIAC REHAB PHASE II EXERCISE from 03/05/2016 in [place]   Date  02/20/16   Educator  Barnet Pall, RN   Instruction Review Code  2- meets goals/outcomes      Exercising on Your Own:  -Group instruction provided by verbal instruction, power point, and written materials to support subject.  Instructors discuss benefits of exercise, components of exercise, frequency and intensity of exercise, and end points for exercise.  Also discuss use of nitroglycerin and activating EMS.  Review options of places to exercise outside of rehab.  Review guidelines for sex with heart disease.      CARDIAC REHAB PHASE II EXERCISE from 03/05/2016 in [place]   Date  02/13/16   Educator  Seward Carol, MS,ACSM CCES   Instruction Review Code  2- meets goals/outcomes      Cardiac Drugs I:  -Group instruction provided by verbal instruction and written materials to support subject.  Instructor reviews cardiac drug classes: antiplatelets,  anticoagulants, beta blockers, and statins.  Instructor discusses reasons, side effects, and lifestyle considerations for each drug class.      CARDIAC REHAB PHASE II EXERCISE from 03/05/2016 in [place]   Date  02/27/16   Educator  Pharm D   Instruction Review Code  2- meets goals/outcomes      Cardiac Drugs II:  -Group instruction provided by verbal instruction and written materials to support subject.  Instructor reviews cardiac drug classes: angiotensin converting enzyme inhibitors (ACE-I), angiotensin II receptor blockers (ARBs), nitrates, and calcium channel blockers.  Instructor discusses reasons, side effects, and lifestyle considerations for each drug class.      CARDIAC REHAB PHASE II EXERCISE from 03/05/2016 in [place]   Date  01/23/16   Educator  Pharm D   Instruction Review Code  2- meets goals/outcomes      Anatomy and Physiology of the Circulatory System:  -  Group instruction provided by verbal instruction, video, and written materials to support subject.  Reviews functional anatomy of heart, how it relates to various diagnoses, and what role the heart plays in the overall system.      CARDIAC REHAB PHASE II EXERCISE from 03/05/2016 in [place]   Date  01/30/16   Instruction Review Code  2- meets goals/outcomes      Knowledge Questionnaire Score:     Knowledge Questionnaire Score - 12/13/15 1100    Knowledge Questionnaire Score   Pre Score 24/24      Core Components/Risk Factors/Patient Goals at Admission:     Personal Goals and Risk Factors at Admission - 12/13/15 0754    Core Components/Risk Factors/Patient Goals on Admission    Weight Management Yes;Weight Loss   Intervention Weight Management: Develop a combined nutrition and exercise program designed to reach desired caloric intake, while maintaining appropriate intake of nutrient and fiber, sodium and fats,  and appropriate energy expenditure required for the weight goal.;Weight Management: Provide education and appropriate resources to help participant work on and attain dietary goals.   Admit Weight 161 lb 2.5 oz (73.1 kg)   Goal Weight: Short Term 155 lb (70.308 kg)   Goal Weight: Long Term 150 lb (68.04 kg)   Expected Outcomes Short Term: Continue to assess and modify interventions until short term weight is achieved;Long Term: Adherence to nutrition and physical activity/exercise program aimed toward attainment of established weight goal;Weight Loss: Understanding of general recommendations for a balanced deficit meal plan, which promotes 1-2 lb weight loss per week and includes a negative energy balance of [phone] kcal/d;Understanding recommendations for meals to include 15-35% energy as protein, 25-35% energy from fat, 35-60% energy from carbohydrates, less than 200mg  of dietary cholesterol, 20-35 gm of total fiber daily;Understanding of distribution of calorie intake throughout the day with the consumption of 4-5 meals/snacks   Sedentary Yes   Intervention Provide advice, education, support and counseling about physical activity/exercise needs.;Develop an individualized exercise prescription for aerobic and resistive training based on initial evaluation findings, risk stratification, comorbidities and participant's personal goals.   Expected Outcomes Achievement of increased cardiorespiratory fitness and enhanced flexibility, muscular endurance and strength shown through measurements of functional capacity and personal statement of participant.   Increase Strength and Stamina Yes   Intervention Provide advice, education, support and counseling about physical activity/exercise needs.;Develop an individualized exercise prescription for aerobic and resistive training based on initial evaluation findings, risk stratification, comorbidities and participant's personal goals.   Expected Outcomes Achievement  of increased cardiorespiratory fitness and enhanced flexibility, muscular endurance and strength shown through measurements of functional capacity and personal statement of participant.   Diabetes Yes   Intervention Provide education about signs/symptoms and action to take for hypo/hyperglycemia.;Provide education about proper nutrition, including hydration, and aerobic/resistive exercise prescription along with prescribed medications to achieve blood glucose in normal ranges: Fasting glucose 65-99 mg/dL   Expected Outcomes Short Term: Participant verbalizes understanding of the signs/symptoms and immediate care of hyper/hypoglycemia, proper foot care and importance of medication, aerobic/resistive exercise and nutrition plan for blood glucose control.;Long Term: Attainment of HbA1C < 7%.   Hypertension Yes   Intervention Provide education on lifestyle modifcations including regular physical activity/exercise, weight management, moderate sodium restriction and increased consumption of fresh fruit, vegetables, and low fat dairy, alcohol moderation, and smoking cessation.;Monitor prescription use compliance.   Expected Outcomes Short Term: Continued assessment and intervention until BP is < 140/56mm HG in hypertensive participants. <  130/75mm HG in hypertensive participants with diabetes, heart failure or chronic kidney disease.;Long Term: Maintenance of blood pressure at goal levels.   Lipids Yes   Intervention Provide education and support for participant on nutrition & aerobic/resistive exercise along with prescribed medications to achieve LDL 70mg , HDL >40mg .   Expected Outcomes Short Term: Participant states understanding of desired cholesterol values and is compliant with medications prescribed. Participant is following exercise prescription and nutrition guidelines.;Long Term: Cholesterol controlled with medications as prescribed, with individualized exercise RX and with personalized nutrition plan.  Value goals: LDL < 70mg , HDL > 40 mg.   Personal Goal Other Yes   Personal Goal Feel better-Have more energy   Intervention Provide exercise prescriptiona and education for lifestyle changes   Expected Outcomes Able to return to normal activities      Core Components/Risk Factors/Patient Goals Review:      Goals and Risk Factor Review      01/30/16 1705 02/29/16 0901         Core Components/Risk Factors/Patient Goals Review   Personal Goals Review Weight Management/Obesity;Sedentary;Increase Strength and Stamina;Other Sedentary;Increase Strength and Stamina;Other      Review Pt is close to short-term wt loss goal. Currently wt is 157.5 lbs. Pt is walking 4 days/ week at home. Pt feels energy is improving and feels he is able to walk faster. Energy is better. Walking 50-60 minutes on the days he doesn't come to cardiac rehab.       Expected Outcomes Continue home exercise program and maintain wt loss. Continue daily exercise routine.         Core Components/Risk Factors/Patient Goals at Discharge (Final Review):      Goals and Risk Factor Review - 02/29/16 0901    Core Components/Risk Factors/Patient Goals Review   Personal Goals Review Sedentary;Increase Strength and Stamina;Other   Review Energy is better. Walking 50-60 minutes on the days he doesn't come to cardiac rehab.    Expected Outcomes Continue daily exercise routine.      ITP Comments:     ITP Comments      12/13/15 0752           ITP Comments Medical Director-Dr. Fransico Him, MD          Comments:  Pt is making expected progress toward personal goals after completing  30 sessions. Recommend continued exercise and life style modification education including  stress management and relaxation techniques to decrease cardiac risk profile. Cherre Huger, BSN

## 2016-03-07 ENCOUNTER — Encounter (HOSPITAL_COMMUNITY)
Admission: RE | Admit: 2016-03-07 | Discharge: 2016-03-07 | Disposition: A | Discharge status: Home / Self Care | Payer: Medicare Other | Source: Ambulatory Visit | Attending: Cardiology | Admitting: Cardiology

## 2016-03-07 DIAGNOSIS — Z951 Presence of aortocoronary bypass graft: Secondary | ICD-10-CM

## 2016-03-07 DIAGNOSIS — I213 ST elevation (STEMI) myocardial infarction of unspecified site: Secondary | ICD-10-CM

## 2016-03-10 ENCOUNTER — Encounter (HOSPITAL_COMMUNITY)
Admission: RE | Admit: 2016-03-10 | Discharge: 2016-03-10 | Disposition: A | Discharge status: Home / Self Care | Payer: Medicare Other | Source: Ambulatory Visit | Attending: Cardiology | Admitting: Cardiology

## 2016-03-10 DIAGNOSIS — Z951 Presence of aortocoronary bypass graft: Secondary | ICD-10-CM

## 2016-03-10 DIAGNOSIS — I213 ST elevation (STEMI) myocardial infarction of unspecified site: Secondary | ICD-10-CM

## 2016-03-12 ENCOUNTER — Encounter (HOSPITAL_COMMUNITY)
Admission: RE | Admit: 2016-03-12 | Discharge: 2016-03-12 | Disposition: A | Discharge status: Home / Self Care | Payer: Medicare Other | Source: Ambulatory Visit | Attending: Cardiology | Admitting: Cardiology

## 2016-03-12 DIAGNOSIS — I213 ST elevation (STEMI) myocardial infarction of unspecified site: Secondary | ICD-10-CM

## 2016-03-12 DIAGNOSIS — Z951 Presence of aortocoronary bypass graft: Secondary | ICD-10-CM

## 2016-03-12 DIAGNOSIS — I2119 ST elevation (STEMI) myocardial infarction involving other coronary artery of inferior wall: Secondary | ICD-10-CM

## 2016-03-14 ENCOUNTER — Encounter (HOSPITAL_COMMUNITY)
Admission: RE | Admit: 2016-03-14 | Discharge: 2016-03-14 | Disposition: A | Discharge status: Home / Self Care | Payer: Medicare Other | Source: Ambulatory Visit | Attending: Cardiology | Admitting: Cardiology

## 2016-03-14 DIAGNOSIS — Z951 Presence of aortocoronary bypass graft: Secondary | ICD-10-CM | POA: Diagnosis not present

## 2016-03-14 DIAGNOSIS — I213 ST elevation (STEMI) myocardial infarction of unspecified site: Secondary | ICD-10-CM

## 2016-03-17 ENCOUNTER — Encounter (HOSPITAL_COMMUNITY)
Admission: RE | Admit: 2016-03-17 | Discharge: 2016-03-17 | Disposition: A | Discharge status: Home / Self Care | Payer: Medicare Other | Source: Ambulatory Visit | Attending: Cardiology | Admitting: Cardiology

## 2016-03-17 DIAGNOSIS — Z951 Presence of aortocoronary bypass graft: Secondary | ICD-10-CM | POA: Diagnosis not present

## 2016-03-17 DIAGNOSIS — I213 ST elevation (STEMI) myocardial infarction of unspecified site: Secondary | ICD-10-CM | POA: Diagnosis present

## 2016-03-19 ENCOUNTER — Encounter (HOSPITAL_COMMUNITY)
Admission: RE | Admit: 2016-03-19 | Discharge: 2016-03-19 | Disposition: A | Discharge status: Home / Self Care | Payer: Medicare Other | Source: Ambulatory Visit | Attending: Cardiology | Admitting: Cardiology

## 2016-03-19 VITALS — BP 120/78 | HR 87 | Ht 65.25 in | Wt 165.3 lb

## 2016-03-19 DIAGNOSIS — Z951 Presence of aortocoronary bypass graft: Secondary | ICD-10-CM | POA: Diagnosis not present

## 2016-03-19 DIAGNOSIS — I213 ST elevation (STEMI) myocardial infarction of unspecified site: Secondary | ICD-10-CM

## 2016-03-19 NOTE — Progress Notes (Signed)
DISCHARGE SUMMARY  Patient Details  Name: Fernando Hess MRN: [id number] Date of Birth: Nov 21, 1945 Referring Provider:        CARDIAC REHAB PHASE II EXERCISE from 12/26/2015 in [place]   Referring Provider  Landry Corporal MD      Encounter Date: 03/19/2016  Check In:   Capillary Blood Glucose: No results found for this or any previous visit (from the past 24 hour(s)).   Goals Met:  Exercise tolerated well Personal goals reviewed  Goals Unmet:  Not Applicable  Comments: .Pt graduated from cardiac rehab program today with completion of 36 exercise sessions in Phase II. Pt maintained excellent attendance and progressed nicely during his participation in rehab as evidenced by increased MET level.   Medication list reconciled. Repeat  PHQ score-0.  This is an improvement from previous screening completed at the beginning of pts participation in cardiac rehab. Pt has made noticeable changes in his overall outlook on life and with his relationship with his wife.  Pt agrees that he is not as anxious nor easily upset over things as he once was.  Pt observed interacting with fellow class participants during exercise as well as after exercise is completed by eating lunch together. Pt has made significant lifestyle changes and should be commended for his success. Pt feels he has achieved his goals during cardiac rehab.  Pt has more energy and does not fatigue easily.  Pt maintained weight and has not gained back what he lost as a result of the heart surgery. Pt plans to continue exercise with walking every day.  Pt is able to ambulate 2 miles a day with no complaints.  It was a pleasure to have this patient participate in the cardiac rehab program. Maurice Small RN, BSN     Dr. Fransico Him is Medical Director for Cardiac Rehab at [facility].

## 2016-03-21 ENCOUNTER — Encounter (HOSPITAL_COMMUNITY): Payer: Medicare Other

## 2016-04-29 IMAGING — CR DG CHEST 2V
2 series · 2 of 2 positions shown · non-contrast
Comparison: 10/02/2009

CLINICAL DATA: Chest pain.  Neck pain.

EXAM:
CHEST  2 VIEW

[w chest pa]
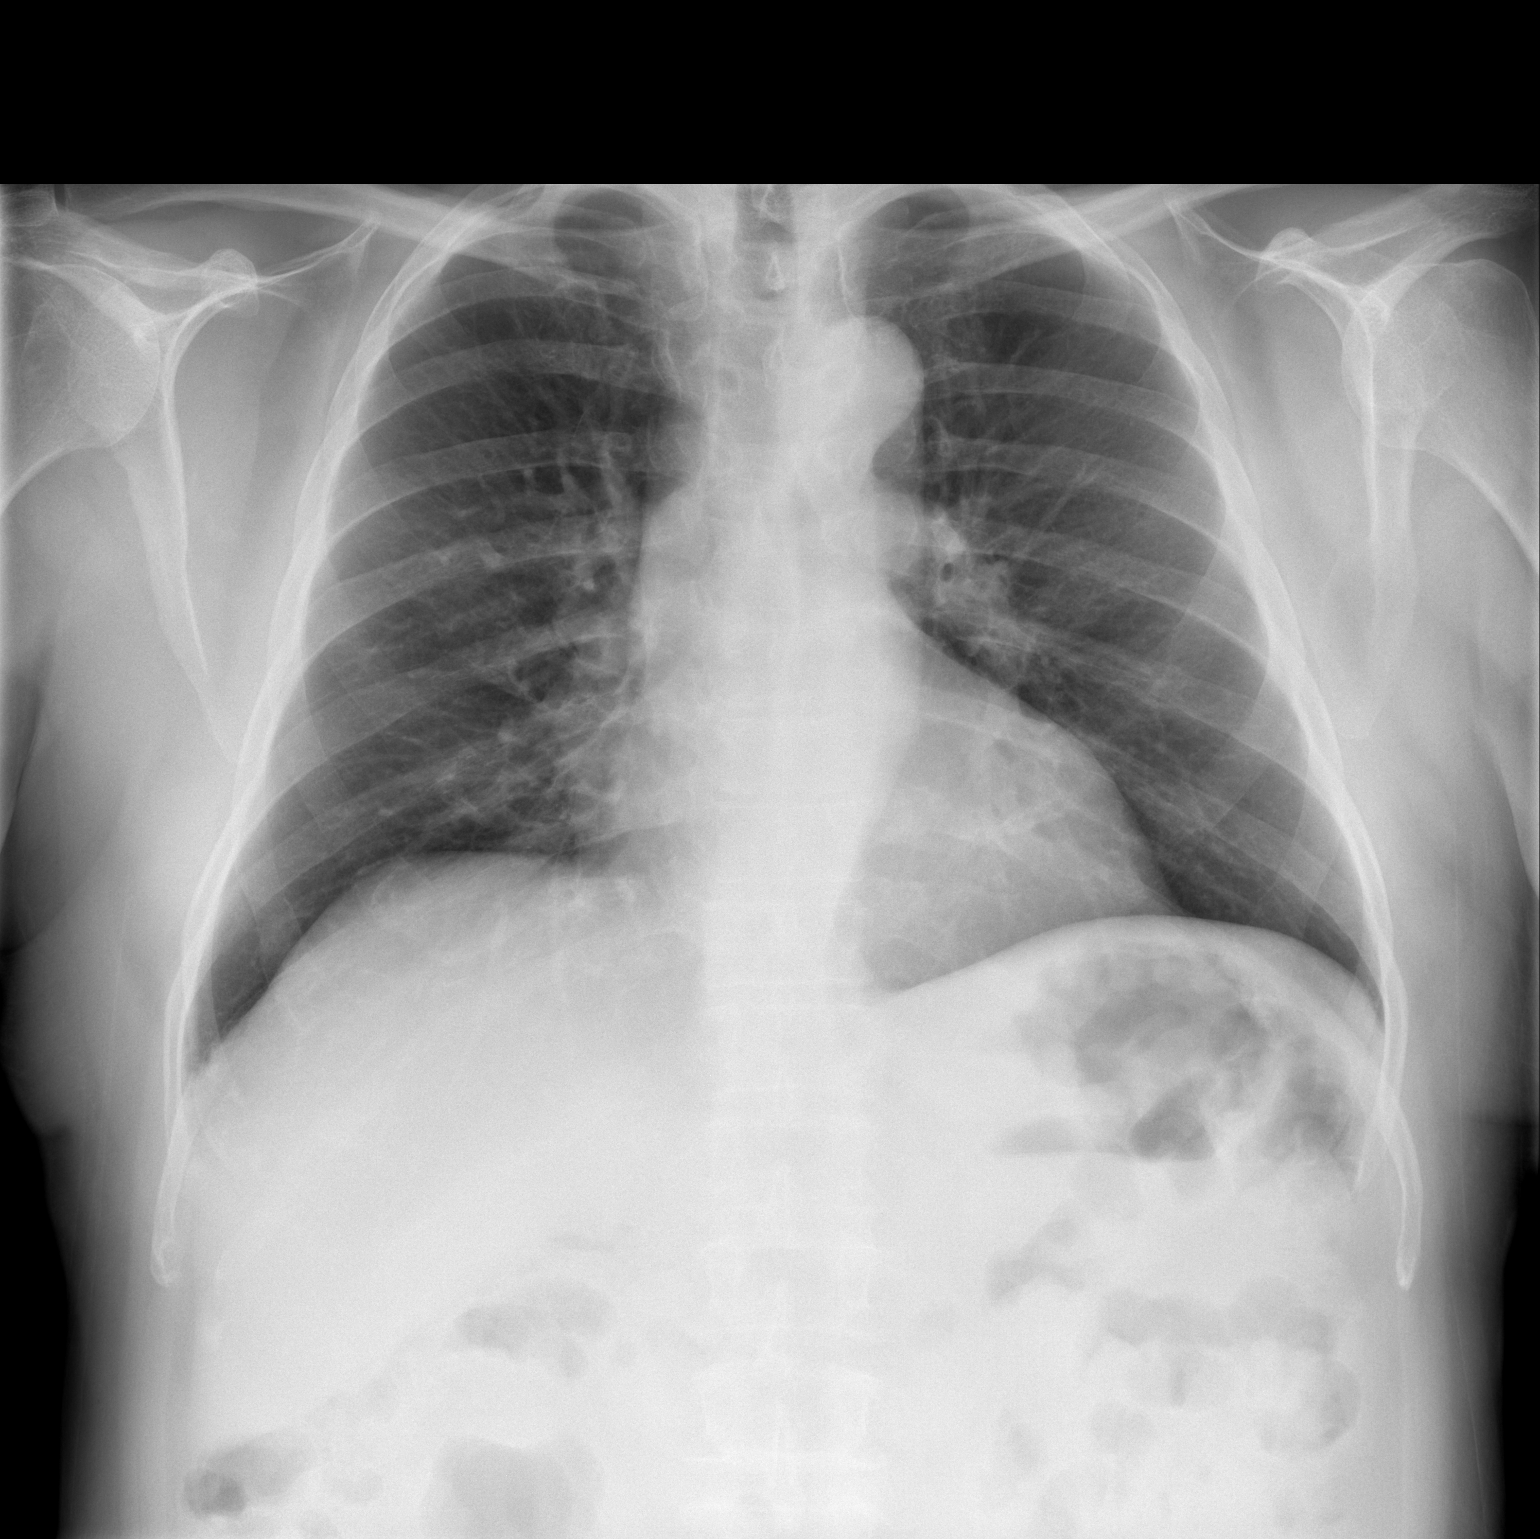

[w chest lat]
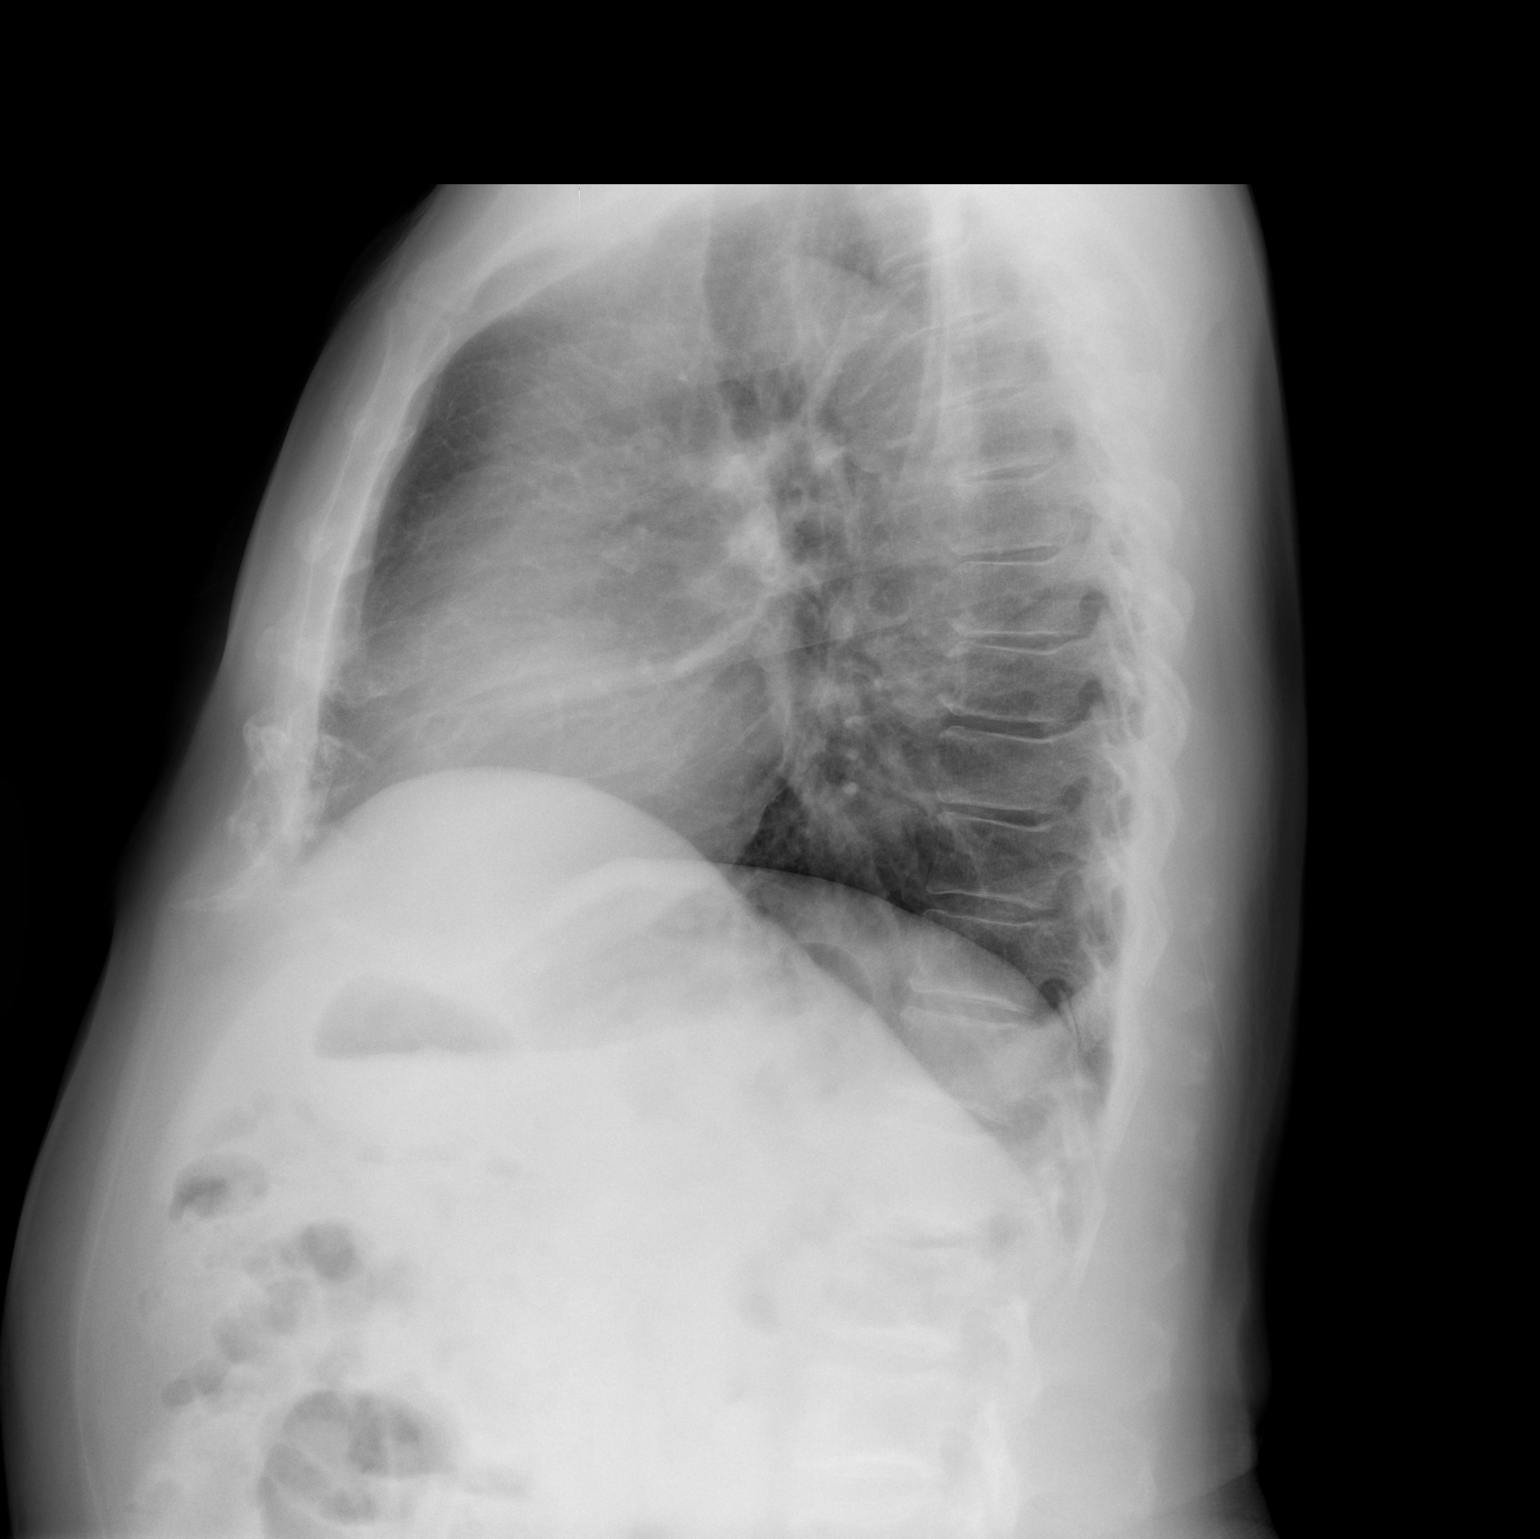

[2 of 2 positions shown; findings below may reference images not displayed]

FINDINGS: Airway thickening is present, suggesting bronchitis or reactive
airways disease. Cardiac and mediastinal margins appear normal. No
airspace opacity identified. No pleural effusion. Mild thoracic
spondylosis.
IMPRESSION: 1. Airway thickening is present, suggesting bronchitis or reactive
airways disease.

## 2016-07-14 ENCOUNTER — Encounter: Payer: Medicare Other | Attending: Family Medicine | Admitting: Skilled Nursing Facility1

## 2016-07-14 ENCOUNTER — Encounter: Payer: Self-pay | Admitting: Skilled Nursing Facility1

## 2016-07-14 DIAGNOSIS — E1122 Type 2 diabetes mellitus with diabetic chronic kidney disease: Secondary | ICD-10-CM | POA: Diagnosis not present

## 2016-07-14 DIAGNOSIS — Z713 Dietary counseling and surveillance: Secondary | ICD-10-CM | POA: Diagnosis not present

## 2016-07-14 DIAGNOSIS — E119 Type 2 diabetes mellitus without complications: Secondary | ICD-10-CM

## 2016-07-14 DIAGNOSIS — N189 Chronic kidney disease, unspecified: Secondary | ICD-10-CM | POA: Diagnosis not present

## 2016-07-14 NOTE — Progress Notes (Signed)
Pt states he does not remember his prior nutrition appointments. Pt states he has been taken off glimeperide and is now taking [place] and metformin. Pt states his A1C is 10. Pts wife states they eat out every day. Pt states he checks his blood sugar every morning. Pt seemed confused most of the appointment but his wife was sharp. Pt states he will not be coming back for a follow up as they have met their allowed amount of time for insurance pay.   Goals: -Get a referral from your regular care doctor for an endocrinologist   -Always bring your meter with you everywhere you go -Always Properly dispose of your needles:  -Discard in a hard plastic/metal container with a lid (something the needle can't puncture)  -Write Do Not Recycle on the outside of the container  -Example: A laundry detergent bottle -Never use the same needle more than once -Eat 2-3 carbohydrate choices for each meal and 1 for each snack -A meal: carbohydrates, protein, vegetable -A snack: A Fruit OR Vegetable AND Protein  -Try to be more active -Always pay attention to your body keeping watchful of possible low blood sugar (below 70) or high blood sugar (above 200)  -Check your blood sugar before you eat breakfast and 2 hours after a meal throughout the week

## 2016-07-14 NOTE — Patient Instructions (Addendum)
-  Get a referral from your regular care doctor for an endocrinologist   -Always bring your meter with you everywhere you go -Always Properly dispose of your needles:  -Discard in a hard plastic/metal container with a lid (something the needle can't puncture)  -Write Do Not Recycle on the outside of the container  -Example: A laundry detergent bottle -Never use the same needle more than once -Eat 2-3 carbohydrate choices for each meal and 1 for each snack -A meal: carbohydrates, protein, vegetable -A snack: A Fruit OR Vegetable AND Protein  -Try to be more active -Always pay attention to your body keeping watchful of possible low blood sugar (below 70) or high blood sugar (above 200)  -Check your blood sugar before you eat breakfast and 2 hours after a meal throughout the week

## 2016-10-07 ENCOUNTER — Encounter: Payer: Self-pay | Admitting: Cardiothoracic Surgery

## 2016-10-07 ENCOUNTER — Ambulatory Visit
Admission: RE | Admit: 2016-10-07 | Discharge: 2016-10-07 | Disposition: A | Discharge status: Home / Self Care | Payer: Medicare Other | Source: Ambulatory Visit | Attending: Cardiothoracic Surgery | Admitting: Cardiothoracic Surgery

## 2016-10-07 ENCOUNTER — Ambulatory Visit (INDEPENDENT_AMBULATORY_CARE_PROVIDER_SITE_OTHER): Payer: Medicare Other | Admitting: Cardiothoracic Surgery

## 2016-10-07 ENCOUNTER — Other Ambulatory Visit: Payer: Self-pay | Admitting: *Deleted

## 2016-10-07 VITALS — BP 111/67 | HR 65 | Resp 16 | Ht 66.0 in | Wt 165.0 lb

## 2016-10-07 DIAGNOSIS — Z951 Presence of aortocoronary bypass graft: Secondary | ICD-10-CM | POA: Diagnosis not present

## 2016-10-07 NOTE — Progress Notes (Signed)
PCP is Melinda Crutch, MD Referring Provider is Jacolyn Reedy, MD  Chief Complaint  Patient presents with  . Routine Post Op    s/p CABG 10/02/15...now with swelling at the distal end of his sternal incision.[name] KitchenMarland KitchenCXR    HPI: Patient presents to the office for examination of his surgical incision-lower portion of the sternotomy. 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 The patient completed cardiac rehabilitation and walks 2 miles daily. He is doing well without recurrent angina.  The lower part of the sternal incision creates a popping sensation at times especially when he lays down. 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 I detect no evidence of a incisional hernia. The cartilage itself is fairly stable. The patient admits to pushing on the xiphoid at times as a nervous habit. There is no evidence of infection.  Past Medical History:  Diagnosis Date  . Asthma   . CAD (coronary artery disease), native coronary artery 09/24/2015  . Diabetes mellitus without complication ([place])   . History of kidney stones   . Hyperlipidemia   . Hypertension   . Kidney stones   . 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 APPENDECTOMY    . CARDIAC CATHETERIZATION N/A 09/26/2015   Procedure: Left Heart Cath and Coronary Angiography;  Surgeon: Troy Sine, MD;  Location: [facility];  Service: Cardiovascular;  Laterality: N/A;  . CARDIAC CATHETERIZATION N/A 10/22/2015   Procedure: Left Heart Cath and Coronary Angiography;  Surgeon: Sherren Mocha, MD;  Location: [facility];  Service: Cardiovascular;  Laterality: N/A;  . CATARACT EXTRACTION, BILATERAL    . CORONARY ARTERY BYPASS GRAFT N/A 10/02/2015   Procedure: CORONARY ARTERY BYPASS GRAFTING (CABG) X5 LIMA-LAD; SVG-DIAG; SVG-OM; SVG-PD; SVG-RAMUS  TRANSESOPHAGEAL ECHOCARDIOGRAM (TEE) ENDOSCOPIC GREATER SAPHENOUS VEIN HARVEST BILAT LE;  Surgeon: Ivin Poot, MD;  Location: [facility];  Service: Open Heart Surgery;  Laterality: N/A;  . LEFT HEART CATHETERIZATION WITH CORONARY ANGIOGRAM N/A 04/13/2014   Procedure: LEFT HEART CATHETERIZATION WITH CORONARY ANGIOGRAM;  Surgeon: Jacolyn Reedy, MD;  Location: [facility] CATH LAB;  Service: Cardiovascular;  Laterality: N/A;  . LEG SURGERY  71 yrs old   from fx. right  . TEE WITHOUT CARDIOVERSION N/A 10/02/2015   Procedure: TRANSESOPHAGEAL ECHOCARDIOGRAM (TEE);  Surgeon: Ivin Poot, MD;  Location: [facility];  Service: Open Heart Surgery;  Laterality: N/A;    Family History  Problem Relation Age of Onset  . Heart attack Mother   . Heart attack Father   . Diabetes Brother   . Pancreatic cancer Brother   . Diabetes Brother   . Colon cancer Neg Hx   . Esophageal cancer Neg Hx   . Prostate cancer Neg Hx   . Rectal cancer Neg Hx   . Stomach cancer Neg Hx     Social History Social History  Substance Use Topics  . Smoking status: Never Smoker  . Smokeless tobacco: Never Used  . Alcohol use No     Comment: 1 beer/wine every 2-3 months per patient    Current Outpatient Prescriptions  Medication Sig Dispense Refill  . aspirin 81 MG chewable tablet Chew 1 tablet (81 mg total) by mouth daily.    [name] atorvastatin (LIPITOR) 40 MG tablet Take 40 mg by mouth daily.    . clopidogrel (  PLAVIX) 75 MG tablet Take 1 tablet (75 mg total) by mouth daily with breakfast. 30 tablet 12  . Docusate Calcium (STOOL SOFTENER PO) Take 1 capsule by mouth daily.    [name] glimepiride (AMARYL) 4 MG tablet Take 4 mg by mouth daily with breakfast.     . metFORMIN (GLUCOPHAGE) 500 MG tablet Take by mouth 2 (two) times daily with a meal.    . metoprolol succinate (TOPROL-XL) 25 MG 24 hr tablet Take 25 mg by mouth daily.    . Multiple Vitamin (MULTIVITAMIN) tablet Take 1 tablet by mouth daily.    . ondansetron (ZOFRAN-ODT) 4 MG  disintegrating tablet Take 4 mg by mouth every 8 (eight) hours as needed for nausea or vomiting. Pt states he takes at home when he feels nauseous     No current facility-administered medications for this visit.     Allergies  Allergen Reactions  . Actos [Pioglitazone]     Itching, rash   . Levaquin [Levofloxacin] Itching and Rash  . Lisinopril Itching and Rash    Review of Systems No shortness of breath or chest pain No ankle edema No orthopnea  BP 111/67 (BP Location: Right Arm, Patient Position: Sitting, Cuff Size: Normal)   Pulse 65   Resp 16   Ht 5\' 6"  (1.676 m)   Wt 165 lb (74.8 kg)   SpO2 96% Comment: ON RA  BMI 26.63 kg/m  Physical Exam      Exam    General- alert and comfortable   Lungs- clear without rales, wheezes   Cor- regular rate and rhythm, no murmur , gallop   Abdomen- soft, non-tender   Extremities - warm, non-tender, minimal edema   Neuro- oriented, appropriate, no focal weakness   Sternal incision well-healed stable with prominent xiphoid cartilage which is upturned at the very distal aspect. No evidence of hernia  Diagnostic Tests:  None Impression: Patient is reassured that the prominent xiphoid cartilage is at no threat He is recommended to stop pushing on it to let it become more fixed Plan: Return as needed the patient understands that the xiphoid could be excised with surgery under general anesthesia if it becomes that much of a problem however that would entail stopping Plavix and an outpatient procedure with general anesthesia   Len Childs, MD [facility] [phone]

## 2016-12-01 ENCOUNTER — Emergency Department (HOSPITAL_COMMUNITY): Payer: Medicare Other

## 2016-12-01 ENCOUNTER — Encounter (HOSPITAL_COMMUNITY): Payer: Self-pay | Admitting: Oncology

## 2016-12-01 ENCOUNTER — Emergency Department (HOSPITAL_COMMUNITY)
Admission: EM | Admit: 2016-12-01 | Discharge: 2016-12-01 | Disposition: A | Discharge status: Home / Self Care | Payer: Medicare Other | Attending: Emergency Medicine | Admitting: Emergency Medicine

## 2016-12-01 DIAGNOSIS — J069 Acute upper respiratory infection, unspecified: Secondary | ICD-10-CM | POA: Insufficient documentation

## 2016-12-01 DIAGNOSIS — Z951 Presence of aortocoronary bypass graft: Secondary | ICD-10-CM | POA: Insufficient documentation

## 2016-12-01 DIAGNOSIS — I10 Essential (primary) hypertension: Secondary | ICD-10-CM | POA: Diagnosis not present

## 2016-12-01 DIAGNOSIS — R509 Fever, unspecified: Secondary | ICD-10-CM | POA: Diagnosis present

## 2016-12-01 DIAGNOSIS — J45909 Unspecified asthma, uncomplicated: Secondary | ICD-10-CM | POA: Insufficient documentation

## 2016-12-01 DIAGNOSIS — I2510 Atherosclerotic heart disease of native coronary artery without angina pectoris: Secondary | ICD-10-CM | POA: Insufficient documentation

## 2016-12-01 DIAGNOSIS — Z7984 Long term (current) use of oral hypoglycemic drugs: Secondary | ICD-10-CM | POA: Insufficient documentation

## 2016-12-01 DIAGNOSIS — I252 Old myocardial infarction: Secondary | ICD-10-CM | POA: Diagnosis not present

## 2016-12-01 DIAGNOSIS — Z7982 Long term (current) use of aspirin: Secondary | ICD-10-CM | POA: Insufficient documentation

## 2016-12-01 DIAGNOSIS — B9789 Other viral agents as the cause of diseases classified elsewhere: Secondary | ICD-10-CM

## 2016-12-01 LAB — CBC WITH DIFFERENTIAL/PLATELET
Basophils Absolute: 0 10*3/uL (ref 0.0–0.1)
Basophils Relative: 0 %
Eosinophils Absolute: 0.3 10*3/uL (ref 0.0–0.7)
Eosinophils Relative: 3 %
HCT: 37.4 % — ABNORMAL LOW (ref 39.0–52.0)
Hemoglobin: 11.9 g/dL — ABNORMAL LOW (ref 13.0–17.0)
Lymphocytes Relative: 5 %
Lymphs Abs: 0.5 10*3/uL — ABNORMAL LOW (ref 0.7–4.0)
MCH: 27.1 pg (ref 26.0–34.0)
MCHC: 31.8 g/dL (ref 30.0–36.0)
MCV: 85.2 fL (ref 78.0–100.0)
Monocytes Absolute: 0.4 10*3/uL (ref 0.1–1.0)
Monocytes Relative: 4 %
Neutro Abs: 8.3 10*3/uL — ABNORMAL HIGH (ref 1.7–7.7)
Neutrophils Relative %: 88 %
Platelets: 173 10*3/uL (ref 150–400)
RBC: 4.39 MIL/uL (ref 4.22–5.81)
RDW: 15.4 % (ref 11.5–15.5)
WBC: 9.5 10*3/uL (ref 4.0–10.5)

## 2016-12-01 LAB — BASIC METABOLIC PANEL
Anion gap: 10 (ref 5–15)
BUN: 20 mg/dL (ref 6–20)
CO2: 21 mmol/L — ABNORMAL LOW (ref 22–32)
Calcium: 8.8 mg/dL — ABNORMAL LOW (ref 8.9–10.3)
Chloride: 106 mmol/L (ref 101–111)
Creatinine, Ser: 1.27 mg/dL — ABNORMAL HIGH (ref 0.61–1.24)
GFR calc Af Amer: >60 mL/min (ref >60)
GFR calc non Af Amer: 55 mL/min — ABNORMAL LOW (ref >60)
Glucose, Bld: 252 mg/dL — ABNORMAL HIGH (ref 65–99)
Potassium: 4.9 mmol/L (ref 3.5–5.1)
Sodium: 137 mmol/L (ref 135–145)

## 2016-12-01 LAB — INFLUENZA PANEL BY PCR (TYPE A & B)
Influenza A By PCR: NEGATIVE
Influenza B By PCR: NEGATIVE

## 2016-12-01 MED ORDER — GUAIFENESIN ER 1200 MG PO TB12: 1.0000 | ORAL_TABLET | Freq: Two times a day (BID) | ORAL | 0 refills | Status: DC

## 2016-12-01 MED ORDER — IBUPROFEN 800 MG PO TABS: 800.0000 mg | ORAL_TABLET | Freq: Three times a day (TID) | ORAL | 0 refills | Status: DC | PRN

## 2016-12-01 MED ORDER — PROMETHAZINE-DM 6.25-15 MG/5ML PO SYRP: 5.0000 mL | ORAL_SOLUTION | Freq: Four times a day (QID) | ORAL | 0 refills | Status: DC | PRN

## 2016-12-01 MED ORDER — IBUPROFEN 400 MG PO TABS
600.0000 mg | ORAL_TABLET | Freq: Once | ORAL | Status: AC
  Administered 2016-12-01: 600 mg via ORAL
  Filled 2016-12-01: qty 1

## 2016-12-01 MED ORDER — SODIUM CHLORIDE 0.9 % IV BOLUS (SEPSIS)
1000.0000 mL | Freq: Once | INTRAVENOUS | Status: AC
  Administered 2016-12-01: 1000 mL via INTRAVENOUS

## 2016-12-01 MED ORDER — ACETAMINOPHEN 500 MG PO TABS
1000.0000 mg | ORAL_TABLET | Freq: Once | ORAL | Status: AC
  Administered 2016-12-01: 1000 mg via ORAL
  Filled 2016-12-01: qty 2

## 2016-12-01 NOTE — ED Notes (Signed)
Nurse drawing labs. 

## 2016-12-01 NOTE — ED Notes (Signed)
EDPA made aware of pt's O2 Sats while ambulating and states will work on d/c paperwork. Pt updated.

## 2016-12-01 NOTE — ED Notes (Signed)
Pt given saltines per EDP upon pt request

## 2016-12-01 NOTE — ED Notes (Signed)
Inquired with lab regarding delay in flu swab - pt and family updated.

## 2016-12-01 NOTE — Discharge Instructions (Signed)
Return here as needed.  Follow-up with your primary care Dr. for a recheck, increase her fluid intake and rest as much as possible.  Tylenol every 4 hours and the Motrin for fever

## 2016-12-01 NOTE — ED Notes (Signed)
Patient transported to X-ray 

## 2016-12-01 NOTE — ED Notes (Signed)
PT ambulated with steady gait and stand-by assist only. SpO2 96-98% with Pulse 80-84bpm.

## 2016-12-01 NOTE — ED Provider Notes (Signed)
[facility] DEPT Provider Note   CSN: [id number] Arrival date & time: 12/01/16  4650     History   Chief Complaint Chief Complaint  Patient presents with  . Flu Like Sx    HPI Fernando Hess is a 71 y.o. male.  HPI Patient presents to the emergency department with fever, cough and body aches over the last 24 hours.  Patient states that yesterday started feeling sick and has progressively gotten worse over that timeframe.  The patient states that he did take some Tylenol around 2 AM with minimal relief of his symptoms.  Patient states that nothing seems make the condition better or worse. The patient denies chest pain, shortness of breath, headache,blurred vision, neck pain, weakness, numbness, dizziness, anorexia, edema, abdominal pain, nausea, vomiting, diarrhea, rash, back pain, dysuria, hematemesis, bloody stool, near syncope, or syncope. Past Medical History:  Diagnosis Date  . Asthma   . CAD (coronary artery disease), native coronary artery 09/24/2015  . Diabetes mellitus without complication ([place])   . History of kidney stones   . Hyperlipidemia   . Hypertension   . Kidney stones   . 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 CAD (coronary artery disease) of artery bypass graft   . 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 Elevated troponin   . CAD (coronary artery disease), native coronary artery 09/24/2015  . Acute renal failure (ARF) ([place]) 09/24/2015  . Diabetes mellitus type 2, noninsulin dependent ([place]) 03/01/2008  . Hyperlipdiemia   . Hypertension   . History of kidney stones     Past Surgical History:  Procedure Laterality Date  . APPENDECTOMY    . CARDIAC CATHETERIZATION N/A 09/26/2015   Procedure: Left Heart Cath and Coronary Angiography;  Surgeon: Troy Sine, MD;  Location: [facility];  Service: Cardiovascular;  Laterality: N/A;  . CARDIAC CATHETERIZATION N/A 10/22/2015    Procedure: Left Heart Cath and Coronary Angiography;  Surgeon: Sherren Mocha, MD;  Location: [facility];  Service: Cardiovascular;  Laterality: N/A;  . CATARACT EXTRACTION, BILATERAL    . CORONARY ARTERY BYPASS GRAFT N/A 10/02/2015   Procedure: CORONARY ARTERY BYPASS GRAFTING (CABG) X5 LIMA-LAD; SVG-DIAG; SVG-OM; SVG-PD; SVG-RAMUS TRANSESOPHAGEAL ECHOCARDIOGRAM (TEE) ENDOSCOPIC GREATER SAPHENOUS VEIN HARVEST BILAT LE;  Surgeon: Ivin Poot, MD;  Location: [facility];  Service: Open Heart Surgery;  Laterality: N/A;  . LEFT HEART CATHETERIZATION WITH CORONARY ANGIOGRAM N/A 04/13/2014   Procedure: LEFT HEART CATHETERIZATION WITH CORONARY ANGIOGRAM;  Surgeon: Jacolyn Reedy, MD;  Location: [facility] CATH LAB;  Service: Cardiovascular;  Laterality: N/A;  . LEG SURGERY  71 yrs old   from fx. right  . TEE WITHOUT CARDIOVERSION N/A 10/02/2015   Procedure: TRANSESOPHAGEAL ECHOCARDIOGRAM (TEE);  Surgeon: Ivin Poot, MD;  Location: [facility];  Service: Open Heart Surgery;  Laterality: N/A;       Home Medications    Prior to Admission medications   Medication Sig Start Date End Date Taking? Authorizing Provider  aspirin 81 MG chewable tablet Chew 1 tablet (81 mg total) by mouth daily. 10/25/15  Yes Jacolyn Reedy, MD  atorvastatin (LIPITOR) 40 MG tablet Take 40 mg by mouth daily.   Yes Historical Provider, MD  DULoxetine (CYMBALTA) 20 MG capsule Take 20 mg by mouth daily.   Yes Historical Provider, MD  glimepiride (AMARYL) 4 MG tablet Take 2-4 mg by mouth 2 (two) times  daily. 4mg  in the morning and 2mg  in the evening   Yes Historical Provider, MD  metFORMIN (GLUCOPHAGE) 500 MG tablet Take 1,000 mg by mouth 2 (two) times daily with a meal.    Yes Historical Provider, MD  metoprolol succinate (TOPROL-XL) 25 MG 24 hr tablet Take 25 mg by mouth daily.   Yes Historical Provider, MD  Multiple Vitamin (MULTIVITAMIN) tablet Take 1 tablet by mouth daily.   Yes Historical Provider, MD  nitroGLYCERIN  (NITROSTAT) 0.4 MG SL tablet Place 0.4 mg under the tongue every 5 (five) minutes as needed for chest pain.   Yes Historical Provider, MD  ondansetron (ZOFRAN-ODT) 4 MG disintegrating tablet Take 4 mg by mouth every 8 (eight) hours as needed for nausea or vomiting. Pt states he takes at home when he feels nauseous   Yes Historical Provider, MD  sitaGLIPtin (JANUVIA) 50 MG tablet Take 50 mg by mouth daily.   Yes Historical Provider, MD    Family History Family History  Problem Relation Age of Onset  . Heart attack Mother   . Heart attack Father   . Diabetes Brother   . Pancreatic cancer Brother   . Diabetes Brother   . Colon cancer Neg Hx   . Esophageal cancer Neg Hx   . Prostate cancer Neg Hx   . Rectal cancer Neg Hx   . Stomach cancer Neg Hx     Social History Social History  Substance Use Topics  . Smoking status: Never Smoker  . Smokeless tobacco: Never Used  . Alcohol use No     Comment: 1 beer/wine every 2-3 months per patient     Allergies   Actos [pioglitazone]; Levaquin [levofloxacin]; and Lisinopril   Review of Systems Review of Systems All other systems negative except as documented in the HPI. All pertinent positives and negatives as reviewed in the HPI.  Physical Exam Updated Vital Signs BP 119/62   Pulse 78   Temp 100.2 F (37.9 C) (Oral)   Resp (!) 23   Ht 5\' 6"  (1.676 m)   Wt 73.9 kg   SpO2 92%   BMI 26.31 kg/m   Physical Exam  Constitutional: He is oriented to person, place, and time. He appears well-developed and well-nourished. No distress.  HENT:  Head: Normocephalic and atraumatic.  Mouth/Throat: Oropharynx is clear and moist.  Eyes: Pupils are equal, round, and reactive to light.  Neck: Normal range of motion. Neck supple.  Cardiovascular: Normal rate, regular rhythm and normal heart sounds.  Exam reveals no gallop and no friction rub.   No murmur heard. Pulmonary/Chest: Effort normal and breath sounds normal. No respiratory distress.  He has no wheezes.  Abdominal: Soft. Bowel sounds are normal. He exhibits no distension. There is no tenderness.  Musculoskeletal: Normal range of motion. He exhibits no edema.  Neurological: He is alert and oriented to person, place, and time. He exhibits normal muscle tone. Coordination normal.  Skin: Skin is warm and dry. Capillary refill takes less than 2 seconds. No rash noted. No erythema.  Psychiatric: He has a normal mood and affect. His behavior is normal.  Nursing note and vitals reviewed.    ED Treatments / Results  Labs (all labs ordered are listed, but only abnormal results are displayed) Labs Reviewed  BASIC METABOLIC PANEL - Abnormal; Notable for the following:       Result Value   CO2 21 (*)    Glucose, Bld 252 (*)    Creatinine, Ser 1.27 (*)  Calcium 8.8 (*)    GFR calc non Af Amer 55 (*)    All other components within normal limits  CBC WITH DIFFERENTIAL/PLATELET - Abnormal; Notable for the following:    Hemoglobin 11.9 (*)    HCT 37.4 (*)    Neutro Abs 8.3 (*)    Lymphs Abs 0.5 (*)    All other components within normal limits  INFLUENZA PANEL BY PCR (TYPE A & B)    EKG  EKG Interpretation None       Radiology Dg Chest 2 View  Result Date: 12/01/2016 CLINICAL DATA:  Pain and fever.  Cough. EXAM: CHEST  2 VIEW COMPARISON:  10/07/2016. FINDINGS: Prior CABG. Heart size normal. Mild right base subsegmental atelectasis. No pleural effusion or pneumothorax. No acute bony abnormality . IMPRESSION: 1.  Prior CABG.  Heart size normal. 2. Mild right base subsegmental atelectasis . Electronically Signed   By: Marcello Moores  Register   On: 12/01/2016 07:04    Procedures Procedures (including critical care time)  Medications Ordered in ED Medications  ibuprofen (ADVIL,MOTRIN) tablet 600 mg (600 mg Oral Given 12/01/16 0724)  acetaminophen (TYLENOL) tablet 1,000 mg (1,000 mg Oral Given 12/01/16 0723)  sodium chloride 0.9 % bolus 1,000 mL (1,000 mLs Intravenous New  Bag/Given 12/01/16 0732)     Initial Impression / Assessment and Plan / ED Course  I have reviewed the triage vital signs and the nursing notes.  Pertinent labs & imaging results that were available during my care of the patient were reviewed by me and considered in my medical decision making (see chart for details).    Patient most likely has an influenza-like illness.  His rapid flu tests were negative, but I do feel that this is a viral type illness.  Patient does not have any signs of pneumonia.  His lung sounds are not abnormal on examination treated for this  Final Clinical Impressions(s) / ED Diagnoses   Final diagnoses:  None    New Prescriptions New Prescriptions   No medications on file     Dalia Heading, [name] 12/01/16 [place], MD 12/03/16 [phone]

## 2016-12-01 NOTE — ED Notes (Signed)
Pt comfortable with discharge and follow up instructions. Pt declines wheelchair, escorted to waiting area by this RN. Rx x3

## 2016-12-01 NOTE — ED Notes (Signed)
Dr. Vanita Panda at bedside. Irena Cords and Dr. Vanita Panda aware of BP.

## 2016-12-01 NOTE — ED Triage Notes (Signed)
Pt c/o generalized body aches, cough, dizziness and fatigue x 1 day.  Pt rates pain 8/10.

## 2017-10-12 IMAGING — DX DG CHEST 2V
2 series · 2 of 2 positions shown · non-contrast
Comparison: Radiographs 04/11/2014 and 10/02/2009.

CLINICAL DATA: Aching pain in the temporal region extending into
the jaw.

EXAM:
CHEST  2 VIEW

[w chest pa]
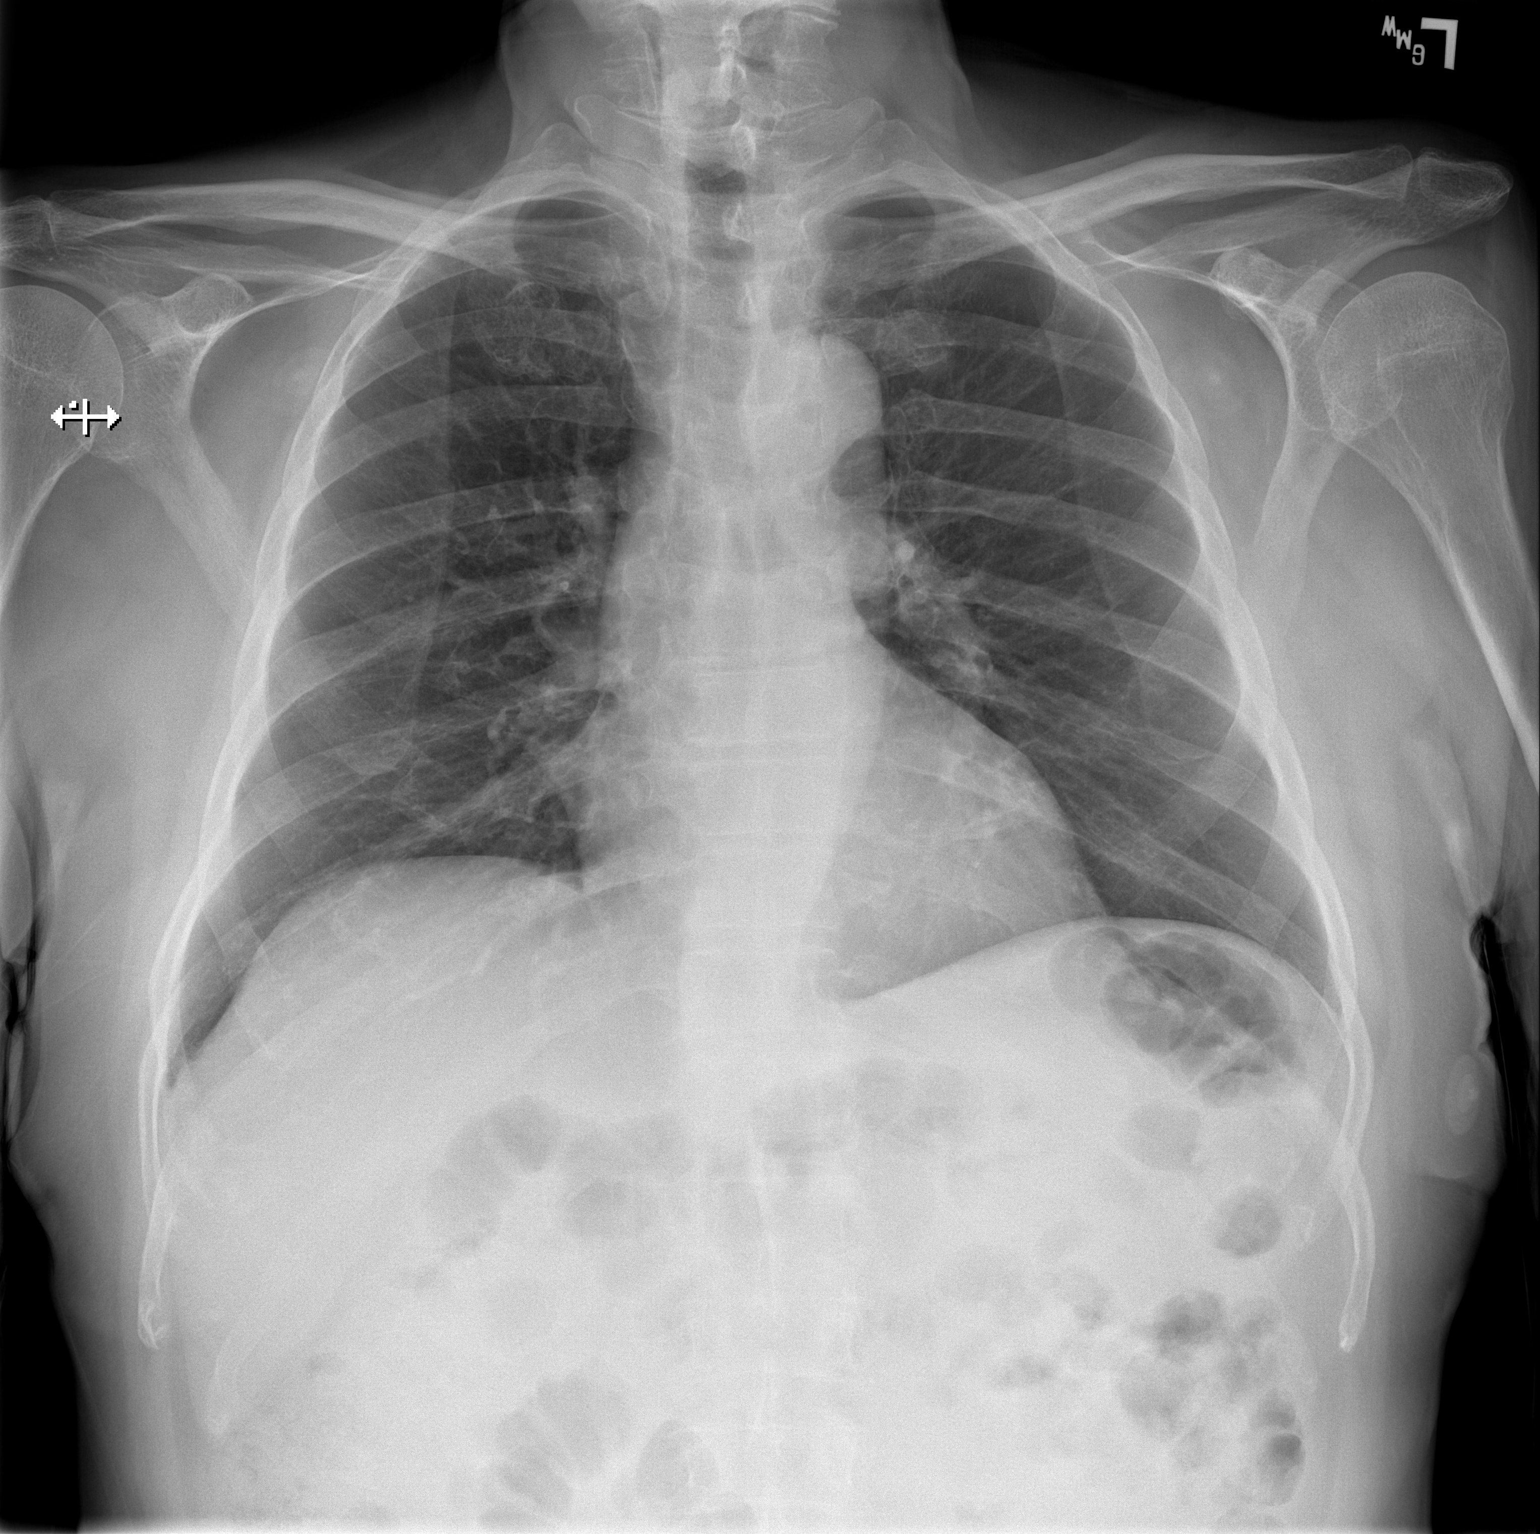

[w chest lat]
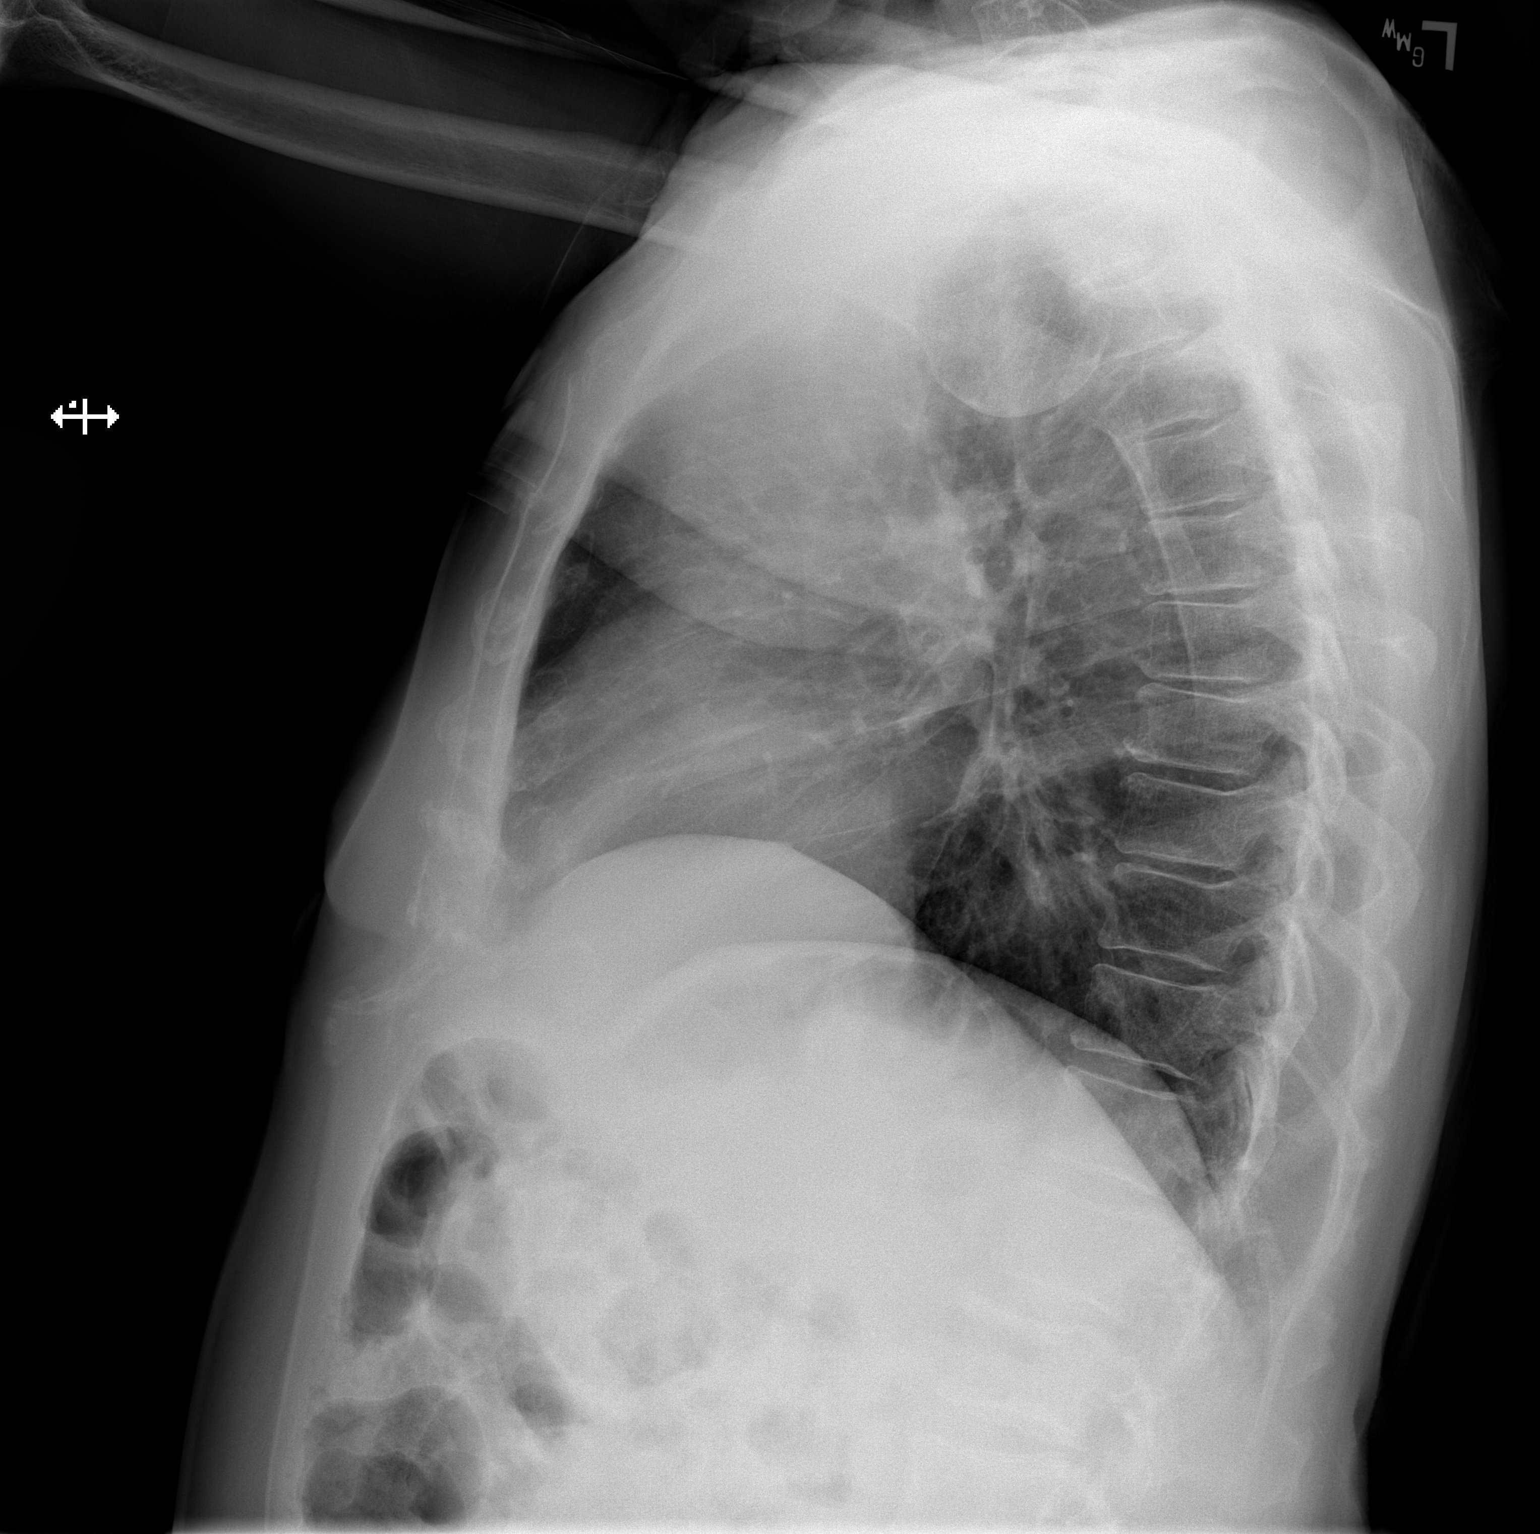

[2 of 2 positions shown; findings below may reference images not displayed]

FINDINGS: The heart size and mediastinal contours are normal. The lungs are
clear. There is no pleural effusion or pneumothorax. No acute
osseous findings are identified.
IMPRESSION: No active cardiopulmonary process.

## 2018-01-03 ENCOUNTER — Emergency Department (HOSPITAL_COMMUNITY): Payer: Medicare Other

## 2018-01-03 ENCOUNTER — Emergency Department (HOSPITAL_COMMUNITY)
Admission: EM | Admit: 2018-01-03 | Discharge: 2018-01-03 | Disposition: A | Discharge status: Home / Self Care | Payer: Medicare Other | Attending: Emergency Medicine | Admitting: Emergency Medicine

## 2018-01-03 ENCOUNTER — Encounter (HOSPITAL_COMMUNITY): Payer: Self-pay

## 2018-01-03 DIAGNOSIS — S79912A Unspecified injury of left hip, initial encounter: Secondary | ICD-10-CM | POA: Diagnosis present

## 2018-01-03 DIAGNOSIS — E785 Hyperlipidemia, unspecified: Secondary | ICD-10-CM | POA: Insufficient documentation

## 2018-01-03 DIAGNOSIS — T07XXXA Unspecified multiple injuries, initial encounter: Secondary | ICD-10-CM

## 2018-01-03 DIAGNOSIS — Y939 Activity, unspecified: Secondary | ICD-10-CM | POA: Diagnosis not present

## 2018-01-03 DIAGNOSIS — Y92003 Bedroom of unspecified non-institutional (private) residence as the place of occurrence of the external cause: Secondary | ICD-10-CM | POA: Insufficient documentation

## 2018-01-03 DIAGNOSIS — Z951 Presence of aortocoronary bypass graft: Secondary | ICD-10-CM | POA: Insufficient documentation

## 2018-01-03 DIAGNOSIS — T148XXA Other injury of unspecified body region, initial encounter: Secondary | ICD-10-CM | POA: Diagnosis not present

## 2018-01-03 DIAGNOSIS — Z7982 Long term (current) use of aspirin: Secondary | ICD-10-CM | POA: Insufficient documentation

## 2018-01-03 DIAGNOSIS — W01190A Fall on same level from slipping, tripping and stumbling with subsequent striking against furniture, initial encounter: Secondary | ICD-10-CM | POA: Insufficient documentation

## 2018-01-03 DIAGNOSIS — Y998 Other external cause status: Secondary | ICD-10-CM | POA: Diagnosis not present

## 2018-01-03 DIAGNOSIS — J45909 Unspecified asthma, uncomplicated: Secondary | ICD-10-CM | POA: Diagnosis not present

## 2018-01-03 DIAGNOSIS — I10 Essential (primary) hypertension: Secondary | ICD-10-CM | POA: Diagnosis not present

## 2018-01-03 DIAGNOSIS — I2510 Atherosclerotic heart disease of native coronary artery without angina pectoris: Secondary | ICD-10-CM | POA: Insufficient documentation

## 2018-01-03 DIAGNOSIS — Z79899 Other long term (current) drug therapy: Secondary | ICD-10-CM | POA: Insufficient documentation

## 2018-01-03 DIAGNOSIS — Z7984 Long term (current) use of oral hypoglycemic drugs: Secondary | ICD-10-CM | POA: Diagnosis not present

## 2018-01-03 DIAGNOSIS — I252 Old myocardial infarction: Secondary | ICD-10-CM | POA: Diagnosis not present

## 2018-01-03 DIAGNOSIS — E119 Type 2 diabetes mellitus without complications: Secondary | ICD-10-CM | POA: Insufficient documentation

## 2018-01-03 MED ORDER — ACETAMINOPHEN 325 MG PO TABS: 650.0000 mg | ORAL_TABLET | Freq: Once | ORAL | Status: DC

## 2018-01-03 NOTE — ED Notes (Signed)
Patient transported to X-ray 

## 2018-01-03 NOTE — ED Triage Notes (Signed)
Pt was here with wife and had a witnessed mechanical fall in room, tripping over bed, pain to L hip R knee, L elbow, did not hit head no LOC.

## 2018-01-03 NOTE — Discharge Instructions (Addendum)
Take over-the-counter pain medicine for relief.  Also apply ice to the area.

## 2018-01-06 NOTE — ED Provider Notes (Signed)
[facility] EMERGENCY DEPARTMENT Provider Note   CSN: [id number] Arrival date & time: 01/03/18  2683     History   Chief Complaint Chief Complaint  Patient presents with  . Fall    HPI Fernando Hess is a 72 y.o. male.  HPI  72 year old male with history of asthma, CAD comes in with chief complaint of mechanical fall. Patient tripped over his bed and injured his left hip and elbow in the process.  Patient is also having pain over his knees.  Patient denies any head trauma and he is not on any blood thinners.  Patient also denies any numbness, tingling, neck pain, vision changes, loss of consciousness.  Past Medical History:  Diagnosis Date  . Asthma   . CAD (coronary artery disease), native coronary artery 09/24/2015  . Diabetes mellitus without complication ([place])   . History of kidney stones   . Hyperlipidemia   . Hypertension   . Kidney stones   . ST elevation myocardial infarction (STEMI) of inferior wall ([place]) 10/22/2015    Patient Active Problem List   Diagnosis Date Noted  . CAD (coronary artery disease) of artery bypass graft   . 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 Elevated troponin   . CAD (coronary artery disease), native coronary artery 09/24/2015  . Acute renal failure (ARF) ([place]) 09/24/2015  . Diabetes mellitus type 2, noninsulin dependent ([place]) 03/01/2008  . Hyperlipdiemia   . Hypertension   . History of kidney stones     Past Surgical History:  Procedure Laterality Date  . APPENDECTOMY    . CARDIAC CATHETERIZATION N/A 09/26/2015   Procedure: Left Heart Cath and Coronary Angiography;  Surgeon: Troy Sine, MD;  Location: [facility];  Service: Cardiovascular;  Laterality: N/A;  . CARDIAC CATHETERIZATION N/A 10/22/2015   Procedure: Left Heart Cath and Coronary Angiography;  Surgeon: Sherren Mocha, MD;  Location: [facility];  Service: Cardiovascular;  Laterality: N/A;  . CATARACT EXTRACTION, BILATERAL    . CORONARY ARTERY BYPASS  GRAFT N/A 10/02/2015   Procedure: CORONARY ARTERY BYPASS GRAFTING (CABG) X5 LIMA-LAD; SVG-DIAG; SVG-OM; SVG-PD; SVG-RAMUS TRANSESOPHAGEAL ECHOCARDIOGRAM (TEE) ENDOSCOPIC GREATER SAPHENOUS VEIN HARVEST BILAT LE;  Surgeon: Ivin Poot, MD;  Location: [facility];  Service: Open Heart Surgery;  Laterality: N/A;  . LEFT HEART CATHETERIZATION WITH CORONARY ANGIOGRAM N/A 04/13/2014   Procedure: LEFT HEART CATHETERIZATION WITH CORONARY ANGIOGRAM;  Surgeon: Jacolyn Reedy, MD;  Location: [facility] CATH LAB;  Service: Cardiovascular;  Laterality: N/A;  . LEG SURGERY  72 yrs old   from fx. right  . TEE WITHOUT CARDIOVERSION N/A 10/02/2015   Procedure: TRANSESOPHAGEAL ECHOCARDIOGRAM (TEE);  Surgeon: Ivin Poot, MD;  Location: [facility];  Service: Open Heart Surgery;  Laterality: N/A;        Home Medications    Prior to Admission medications   Medication Sig Start Date End Date Taking? Authorizing Provider  aspirin 81 MG chewable tablet Chew 1 tablet (81 mg total) by mouth daily. 10/25/15   Jacolyn Reedy, MD  atorvastatin (LIPITOR) 40 MG tablet Take 40 mg by mouth daily.    [provider]  DULoxetine (CYMBALTA) 20 MG capsule Take 20 mg by mouth daily.    [provider]  glimepiride (AMARYL) 4 MG tablet Take 2-4 mg by mouth 2 (two) times daily. 4mg  in the morning and 2mg  in the evening    [provider]  Guaifenesin 1200 MG TB12 Take 1 tablet (1,200  mg total) by mouth 2 (two) times daily. 12/01/16   Lawyer, Harrell Gave, [name]  ibuprofen (ADVIL,MOTRIN) 800 MG tablet Take 1 tablet (800 mg total) by mouth every 8 (eight) hours as needed. 12/01/16   Lawyer, Harrell Gave, [name]  metFORMIN (GLUCOPHAGE) 500 MG tablet Take 1,000 mg by mouth 2 (two) times daily with a meal.     [provider]  metoprolol succinate (TOPROL-XL) 25 MG 24 hr tablet Take 25 mg by mouth daily.    [provider]  Multiple Vitamin (MULTIVITAMIN) tablet Take 1 tablet by mouth daily.    [provider]  nitroGLYCERIN (NITROSTAT) 0.4 MG SL tablet Place 0.4 mg under the tongue every 5 (five) minutes as needed for chest pain.    [provider]  ondansetron (ZOFRAN-ODT) 4 MG disintegrating tablet Take 4 mg by mouth every 8 (eight) hours as needed for nausea or vomiting. Pt states he takes at home when he feels nauseous    [provider]  promethazine-dextromethorphan (PROMETHAZINE-DM) 6.25-15 MG/5ML syrup Take 5 mLs by mouth 4 (four) times daily as needed for cough. 12/01/16   Lawyer, Harrell Gave, [name]  sitaGLIPtin (JANUVIA) 50 MG tablet Take 50 mg by mouth daily.    [provider]    Family History Family History  Problem Relation Age of Onset  . Heart attack Mother   . Heart attack Father   . Diabetes Brother   . Pancreatic cancer Brother   . Diabetes Brother   . Colon cancer Neg Hx   . Esophageal cancer Neg Hx   . Prostate cancer Neg Hx   . Rectal cancer Neg Hx   . Stomach cancer Neg Hx     Social History Social History   Tobacco Use  . Smoking status: Never Smoker  . Smokeless tobacco: Never Used  Substance Use Topics  . Alcohol use: No    Comment: 1 beer/wine every 2-3 months per patient  . Drug use: No     Allergies   Actos [pioglitazone]; Levaquin [levofloxacin]; and Lisinopril   Review of Systems Review of Systems  All other systems reviewed and are negative.    Physical Exam Updated Vital Signs BP 110/69 (BP Location: Right Arm)   Pulse 72   Temp 98.7 F (37.1 C) (Oral)   Resp 16   SpO2 97%   Physical Exam  Constitutional: He is oriented to person, place, and time. He appears well-developed.  HENT:  Head: Atraumatic.  Neck: Neck supple.  Cardiovascular: Normal rate.  Pulmonary/Chest: Effort normal.  Abdominal: Soft.  Musculoskeletal: He exhibits tenderness. He exhibits no deformity.  Head to toe evaluation shows no large hematoma, bleeding of the scalp, no facial abrasions, no spine step offs, crepitus  of the chest or neck, no tenderness to palpation of the bilateral upper and lower extremities, no gross deformities, no chest tenderness, no pelvic pain.   Neurological: He is alert and oriented to person, place, and time.  Skin: Skin is warm.  Nursing note and vitals reviewed.    ED Treatments / Results  Labs (all labs ordered are listed, but only abnormal results are displayed) Labs Reviewed - No data to display  EKG None  Radiology No results found.  Procedures Procedures (including critical care time)  Medications Ordered in ED Medications - No data to display   Initial Impression / Assessment and Plan / ED Course  I have reviewed the triage vital signs and the nursing notes.  Pertinent labs & imaging results that  were available during my care of the patient were reviewed by me and considered in my medical decision making (see chart for details).     DDx includes: - Mechanical falls - ICH - Fractures - Contusions - Soft tissue injury  Patient comes in with chief complaint of fall.  Patient had a mechanical fall at home.  He complains of musculoskeletal pain.  I feel comfortable clearing his brain and C-spine clinically, given the fall occurred several hours before ED arrival and patient is asymptomatic and not on any blood thinners.  Appropriate radiographs ordered.  Final Clinical Impressions(s) / ED Diagnoses   Final diagnoses:  Multiple contusions    ED Discharge Orders    None       Varney Biles, MD 01/06/18 2326

## 2018-05-07 ENCOUNTER — Ambulatory Visit: Payer: Medicare Other | Attending: Family Medicine | Admitting: Physical Therapy

## 2018-05-07 ENCOUNTER — Encounter: Payer: Self-pay | Admitting: Physical Therapy

## 2018-05-07 ENCOUNTER — Other Ambulatory Visit: Payer: Self-pay

## 2018-05-07 DIAGNOSIS — R262 Difficulty in walking, not elsewhere classified: Secondary | ICD-10-CM | POA: Insufficient documentation

## 2018-05-07 DIAGNOSIS — M79661 Pain in right lower leg: Secondary | ICD-10-CM | POA: Insufficient documentation

## 2018-05-07 DIAGNOSIS — R252 Cramp and spasm: Secondary | ICD-10-CM | POA: Diagnosis not present

## 2018-05-07 NOTE — Therapy (Signed)
[place], [place], [address] Phone: [phone]   Fax:  [phone]  Physical Therapy Evaluation  Patient Details  Name: Fernando Hess MRN: [id number] Date of Birth: Nov 20, 1945 Referring Provider: Dr Harrington Challenger   Encounter Date: 05/07/2018  PT End of Session - 05/07/18 1048    Visit Number  1    Number of Visits  8    Date for PT Re-Evaluation  06/18/18    PT Start Time  0935    PT Stop Time  1025    PT Time Calculation (min)  50 min    Activity Tolerance  Patient tolerated treatment well    Behavior During Therapy  [facility] for tasks assessed/performed       Past Medical History:  Diagnosis Date  . Asthma   . CAD (coronary artery disease), native coronary artery 09/24/2015  . Diabetes mellitus without complication ([place])   . History of kidney stones   . Hyperlipidemia   . Hypertension   . Kidney stones   . ST elevation myocardial infarction (STEMI) of inferior wall ([place]) 10/22/2015    Past Surgical History:  Procedure Laterality Date  . APPENDECTOMY    . CARDIAC CATHETERIZATION N/A 09/26/2015   Procedure: Left Heart Cath and Coronary Angiography;  Surgeon: Troy Sine, MD;  Location: [facility];  Service: Cardiovascular;  Laterality: N/A;  . CARDIAC CATHETERIZATION N/A 10/22/2015   Procedure: Left Heart Cath and Coronary Angiography;  Surgeon: Sherren Mocha, MD;  Location: [facility];  Service: Cardiovascular;  Laterality: N/A;  . CATARACT EXTRACTION, BILATERAL    . CORONARY ARTERY BYPASS GRAFT N/A 10/02/2015   Procedure: CORONARY ARTERY BYPASS GRAFTING (CABG) X5 LIMA-LAD; SVG-DIAG; SVG-OM; SVG-PD; SVG-RAMUS TRANSESOPHAGEAL ECHOCARDIOGRAM (TEE) ENDOSCOPIC GREATER SAPHENOUS VEIN HARVEST BILAT LE;  Surgeon: Ivin Poot, MD;  Location: [facility];  Service: Open Heart Surgery;  Laterality: N/A;  . LEFT HEART CATHETERIZATION WITH CORONARY ANGIOGRAM N/A 04/13/2014   Procedure: LEFT HEART  CATHETERIZATION WITH CORONARY ANGIOGRAM;  Surgeon: Jacolyn Reedy, MD;  Location: [facility] CATH LAB;  Service: Cardiovascular;  Laterality: N/A;  . LEG SURGERY  72 yrs old   from fx. right  . TEE WITHOUT CARDIOVERSION N/A 10/02/2015   Procedure: TRANSESOPHAGEAL ECHOCARDIOGRAM (TEE);  Surgeon: Ivin Poot, MD;  Location: [facility];  Service: Open Heart Surgery;  Laterality: N/A;    There were no vitals filed for this visit.   Subjective Assessment - 05/07/18 0943    Subjective  Patient presents for Rt lower leg /calf pain which began 6 weeks ago.  Pain onset was gradual.  Interferes with his ability to walk, especially on uneven terrain.  He has not pain at rest or even standing still.  He has had to limit walking to none, thus blood sugars are up into 300's.  He is unable to do yardwork as well.      Patient is accompained by:  Family member   spouse    Pertinent History  headache, diabetes, CAD, CABG x 5 with saphenous vein graft bilat. , dizziness    Limitations  Walking;House hold activities;Other (comment);Standing   exercise, steps, hill    How long can you sit comfortably?  not limited    How long can you stand comfortably?  not limited     How long can you walk comfortably?  was walking 2 miles a day about 6 weeks ago.  Not comfortable walking at all now.  Diagnostic tests  none     Patient Stated Goals  Be able walk     Currently in Pain?  Yes   none at rest    Pain Score  7     Pain Location  Leg    Pain Orientation  Left;Lower;Posterior    Pain Descriptors / Indicators  Aching;Tightness    Pain Type  Acute pain    Pain Onset  More than a month ago    Pain Frequency  Intermittent    Aggravating Factors   walking up hill, walking steps     Pain Relieving Factors  turning leg out to the  side , rest , heating pad    Effect of Pain on Daily Activities  limits his ability to exercise and control diabetes         [facility] PT Assessment - 05/07/18 0001      Assessment   Medical  Diagnosis  R calf pain     Referring Provider  Dr Harrington Challenger    Onset Date/Surgical Date  --   6 weeks   Next MD Visit  2 weeks     Prior Therapy  Had cardiac rehab but no PT       Precautions   Precautions  None      Restrictions   Weight Bearing Restrictions  No      Balance Screen   Has the patient fallen in the past 6 months  Yes    How many times?  3    Has the patient had a decrease in activity level because of a fear of falling?   Yes   due to pain    Is the patient reluctant to leave their home because of a fear of falling?   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[employer] , walk , exercise, TV       Cognition   Overall Cognitive Status  Within Functional Limits for tasks assessed      Observation/Other Assessments   Focus on Therapeutic Outcomes (FOTO)   50%      Sensation   Stereognosis  Impaired by gross assessment    Additional Comments  Rt inner lower leg nums since CABG       Posture/Postural Control   Posture/Postural Control  Postural limitations    Postural Limitations  Rounded Shoulders;Forward head;Decreased lumbar lordosis      AROM   Right Ankle Dorsiflexion  0    Left Ankle Dorsiflexion  0      PROM   Overall PROM Comments  tight with PROM to ankle DF       Strength   Overall Strength Comments  knees WFL ankle DF 5/5     Right Hip Flexion  4-/5    Left Hip Flexion  4/5  Right Ankle Plantar Flexion  4/5   pain   Left Ankle Plantar Flexion  4/5      Flexibility    Soft Tissue Assessment /Muscle Length  yes    Hamstrings  tight , stretch felt in calf and hamstring with P SLR, approx. 45 deg     Piriformis  appears functionally tight due to gait compensations       Palpation   Palpation comment  tender in gastrocnemius, soleus, ant tib and into proximal achilles       Special Tests   Other special tests  Neg [place]omen Sign       Transfers   Comments  increased time due to dizziness with transitional movement s      Ambulation/Gait   Ambulation Distance (Feet)  50 Feet    Assistive device  None    Gait Pattern  Decreased step length - right;Decreased stance time - right;Decreased hip/knee flexion - right;Right circumduction;Antalgic;Decreased trunk rotation;Abducted- right    Gait Comments  able to correct knee stiffness when cued but avoiding due to pain in calf      High Level Balance   High Level Balance Comments  SLS <5 sec bilateral , trunk lean , pain on R                 Objective measurements completed on examination: See above findings.              PT Education - 05/07/18 1042    Education Details  HEP, PT/POC, anatomy, DVT and signs/sx, differential diagnosis, OK to massage     Person(s) Educated  Patient;Spouse    Methods  Explanation;Demonstration;Handout    Comprehension  Verbalized understanding;Returned demonstration;Verbal cues required;Need further instruction;Tactile cues required          PT Long Term Goals - 05/07/18 1048      PT LONG TERM GOAL #1   Title  Pt will be I with HEP for LE strength and flexibility.    Time  6    Period  Weeks    Status  New    Target Date  06/18/18      PT LONG TERM GOAL #2   Title  Pt will be able to walk for 2 miles as previous with min increase in pain, <3/10 on all levels of terrain     Time  6    Period  Weeks    Status  New    Target Date  06/18/18      PT LONG TERM GOAL #3   Title  Pt will be able to walk without gait deviation (circumduction,  abduction Rt LE) and no pain short distances in home, clinic and community     Time  6    Period  Weeks    Status  New    Target Date  06/18/18      PT LONG TERM GOAL #4   Title  FOTO score will improve to limited no more than 30% to demo functional improvement     Time  6    Period  Weeks    Status  New    Target Date  06/18/18             Plan - 05/07/18 1052    Clinical Impression Statement  Patient presents for Rt LE /calf pain notable with walking on even and more so, uneven terrain.  is eval is of low complexity.  I do not think he  has a blood clot.  Differential diagnosis would be PVD but his pain is quite localized and reproduced with simple walking in the clinic.   He lacks hip strength , flexibility, balance and ankle mobility.  He should improve in 4 weeks but allowed 6 weeks for scheduing.  He felt better after the session and was able to walk with a more normalized pattern.     Clinical Presentation  Stable    Clinical Decision Making  Low    Rehab Potential  Excellent    PT Frequency  2x / week    PT Duration  6 weeks   8 visits    PT Treatment/Interventions  ADLs/Self Care Home Management;Electrical Stimulation;Functional mobility training;Taping;Therapeutic activities;Iontophoresis 4mg /ml Dexamethasone;Moist Heat;Ultrasound;Cryotherapy;Gait training;Stair training;Therapeutic exercise;Balance training;Patient/family education;Manual techniques;Dry needling;Passive range of motion    PT Next Visit Plan  check HEP, manual to Rt calf, check MMT hip abd, ext , eventual balance work , screen     PT Home Exercise Plan  gastoc/soleus at wall and hamstring with strap     Consulted and Agree with Plan of Care  Patient       Patient will benefit from skilled therapeutic intervention in order to improve the following deficits and impairments:  Abnormal gait, Decreased balance, Decreased mobility, Difficulty walking, Increased muscle spasms, Decreased range of motion,  Decreased strength, Increased fascial restricitons, Impaired flexibility, Postural dysfunction, Pain  Visit Diagnosis: Cramp and spasm  Difficulty in walking, not elsewhere classified  Pain in right lower leg     Problem List Patient Active Problem List   Diagnosis Date Noted  . CAD (coronary artery disease) of artery bypass graft   . 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 Elevated troponin   . CAD (coronary artery disease), native coronary artery 09/24/2015  . Acute renal failure (ARF) ([place]) 09/24/2015  . Diabetes mellitus type 2, noninsulin dependent ([place]) 03/01/2008  . Hyperlipdiemia   . Hypertension   . History of kidney stones     PAA,JENNIFER 05/07/2018, 10:57 AM  [place], [place], [address] Phone: [phone]   Fax:  [phone]  Name: Fernando Hess MRN: [id number] Date of Birth: [birth year]   Raeford Razor, PT 05/07/18 10:58 AM Phone: [phone] Fax: [phone]

## 2018-05-07 NOTE — Patient Instructions (Addendum)
  Also link from internet: [email or web address]

## 2018-05-11 ENCOUNTER — Ambulatory Visit: Payer: Medicare Other | Admitting: Physical Therapy

## 2018-05-11 ENCOUNTER — Encounter: Payer: Self-pay | Admitting: Physical Therapy

## 2018-05-11 DIAGNOSIS — M79661 Pain in right lower leg: Secondary | ICD-10-CM

## 2018-05-11 DIAGNOSIS — R252 Cramp and spasm: Secondary | ICD-10-CM | POA: Diagnosis not present

## 2018-05-11 DIAGNOSIS — R262 Difficulty in walking, not elsewhere classified: Secondary | ICD-10-CM

## 2018-05-11 NOTE — Patient Instructions (Signed)
Abduction: Side Leg Lift (Eccentric) - Side-Lying    Lie on side. Lift top leg slightly higher than shoulder level. Keep top leg straight with body, toes pointing forward. Slowly lower for 3-5 seconds. __10-15_ reps per set, __1-2_ sets per day, _5__ days per week. http://ecce.exer.us/62   Copyright  VHI. All rights reserved.

## 2018-05-11 NOTE — Therapy (Signed)
[place], [place], [address] Phone: [phone]   Fax:  [phone]  Physical Therapy Treatment  Patient Details  Name: Fernando Hess MRN: [id number] Date of Birth: 08-17-1946 Referring Provider: Dr Harrington Challenger   Encounter Date: 05/11/2018  PT End of Session - 05/11/18 0903    Visit Number  2    Number of Visits  8    Date for PT Re-Evaluation  06/18/18    PT Start Time  0850    PT Stop Time  0932    PT Time Calculation (min)  42 min    Activity Tolerance  Patient tolerated treatment well    Behavior During Therapy  [facility] for tasks assessed/performed       Past Medical History:  Diagnosis Date  . Asthma   . CAD (coronary artery disease), native coronary artery 09/24/2015  . Diabetes mellitus without complication ([place])   . History of kidney stones   . Hyperlipidemia   . Hypertension   . Kidney stones   . ST elevation myocardial infarction (STEMI) of inferior wall ([place]) 10/22/2015    Past Surgical History:  Procedure Laterality Date  . APPENDECTOMY    . CARDIAC CATHETERIZATION N/A 09/26/2015   Procedure: Left Heart Cath and Coronary Angiography;  Surgeon: Troy Sine, MD;  Location: [place] CV LAB;  Service: Cardiovascular;  Laterality: N/A;  . CARDIAC CATHETERIZATION N/A 10/22/2015   Procedure: Left Heart Cath and Coronary Angiography;  Surgeon: Sherren Mocha, MD;  Location: [place] CV LAB;  Service: Cardiovascular;  Laterality: N/A;  . CATARACT EXTRACTION, BILATERAL    . CORONARY ARTERY BYPASS GRAFT N/A 10/02/2015   Procedure: CORONARY ARTERY BYPASS GRAFTING (CABG) X5 LIMA-LAD; SVG-DIAG; SVG-OM; SVG-PD; SVG-RAMUS TRANSESOPHAGEAL ECHOCARDIOGRAM (TEE) ENDOSCOPIC GREATER SAPHENOUS VEIN HARVEST BILAT LE;  Surgeon: Ivin Poot, MD;  Location: [place];  Service: Open Heart Surgery;  Laterality: N/A;  . LEFT HEART CATHETERIZATION WITH CORONARY ANGIOGRAM N/A 04/13/2014   Procedure: LEFT HEART  CATHETERIZATION WITH CORONARY ANGIOGRAM;  Surgeon: Jacolyn Reedy, MD;  Location: [facility] CATH LAB;  Service: Cardiovascular;  Laterality: N/A;  . LEG SURGERY  72 yrs old   from fx. right  . TEE WITHOUT CARDIOVERSION N/A 10/02/2015   Procedure: TRANSESOPHAGEAL ECHOCARDIOGRAM (TEE);  Surgeon: Ivin Poot, MD;  Location: [place];  Service: Open Heart Surgery;  Laterality: N/A;    There were no vitals filed for this visit.  Subjective Assessment - 05/11/18 0855    Subjective  No pain.  Ive been able to walk up stairs without a problem  Those stretches really helped. I do have a headache today though.     Currently in Pain?  No/denies         [facility] PT Assessment - 05/11/18 0001      Strength   Right Hip ABduction  3+/5    Left Hip ABduction  3/5      Berg Balance Test   Sit to Stand  Able to stand without using hands and stabilize independently    Standing Unsupported  Able to stand safely 2 minutes    Sitting with Back Unsupported but Feet Supported on Floor or Stool  Able to sit safely and securely 2 minutes    Stand to Sit  Sits safely with minimal use of hands    Transfers  Able to transfer safely, minor use of hands    Standing Unsupported with Eyes Closed  Able to stand 10 seconds  safely    Standing Ubsupported with Feet Together  Able to place feet together independently and stand 1 minute safely    From Standing, Reach Forward with Outstretched Arm  Can reach confidently >25 cm (10")    From Standing Position, Pick up Object from [place] to pick up shoe safely and easily    From Standing Position, Turn to Look Behind Over each Shoulder  Looks behind from both sides and weight shifts well    Turn 360 Degrees  Able to turn 360 degrees safely in 4 seconds or less    Standing Unsupported, Alternately Place Feet on Step/Stool  Able to stand independently and complete 8 steps >20 seconds    Standing Unsupported, One Foot in Front  Able to take small step independently and hold 30  seconds    Standing on One Leg  Tries to lift leg/unable to hold 3 seconds but remains standing independently    Total Score  50    Berg comment:  50/56                   [facility] Adult PT Treatment/Exercise - 05/11/18 0001      Knee/Hip Exercises: Stretches   Active Hamstring Stretch  Both;3 reps;30 seconds    Gastroc Stretch  Both;3 reps;30 seconds    Soleus Stretch  Both;3 reps;30 seconds      Knee/Hip Exercises: Aerobic   Stationary Bike  5 min L1 for warm up      Knee/Hip Exercises: Supine   Bridges  Strengthening;Both;2 sets;10 reps      Knee/Hip Exercises: Sidelying   Hip ABduction  Strengthening;Both;2 sets;10 reps             PT Education - 05/11/18 0903    Education Details  stretching, Merrilee Jansky balance     Person(s) Educated  Patient    Methods  Explanation    Comprehension  Verbalized understanding          PT Long Term Goals - 05/07/18 1048      PT LONG TERM GOAL #1   Title  Pt will be I with HEP for LE strength and flexibility.    Time  6    Period  Weeks    Status  New    Target Date  06/18/18      PT LONG TERM GOAL #2   Title  Pt will be able to walk for 2 miles as previous with min increase in pain, <3/10 on all levels of terrain     Time  6    Period  Weeks    Status  New    Target Date  06/18/18      PT LONG TERM GOAL #3   Title  Pt will be able to walk without gait deviation (circumduction, abduction Rt LE) and no pain short distances in home, clinic and community     Time  6    Period  Weeks    Status  New    Target Date  06/18/18      PT LONG TERM GOAL #4   Title  FOTO score will improve to limited no more than 30% to demo functional improvement     Time  6    Period  Weeks    Status  New    Target Date  06/18/18            Plan - 05/11/18 0906    Clinical Impression Statement  Patient  has seen a great improvement in his pain with walking, stairs.  Berg balance test . Encouraged to walk 10 min and increase time  gradually.  Berg score 50/56.  Rec cane in community.      PT Treatment/Interventions  ADLs/Self Care Home Management;Electrical Stimulation;Functional mobility training;Taping;Therapeutic activities;Iontophoresis 4mg /ml Dexamethasone;Moist Heat;Ultrasound;Cryotherapy;Gait training;Stair training;Therapeutic exercise;Balance training;Patient/family education;Manual techniques;Dry needling;Passive range of motion    PT Next Visit Plan  check HEP, manual to Rt calf, c, eventual balance work ,    PT Home Exercise Plan  gastoc/soleus at wall and hamstring with strap , hip abduction     Consulted and Agree with Plan of Care  Patient       Patient will benefit from skilled therapeutic intervention in order to improve the following deficits and impairments:  Abnormal gait, Decreased balance, Decreased mobility, Difficulty walking, Increased muscle spasms, Decreased range of motion, Decreased strength, Increased fascial restricitons, Impaired flexibility, Postural dysfunction, Pain  Visit Diagnosis: Cramp and spasm  Difficulty in walking, not elsewhere classified  Pain in right lower leg     Problem List Patient Active Problem List   Diagnosis Date Noted  . CAD (coronary artery disease) of artery bypass graft   . 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 Elevated troponin   . CAD (coronary artery disease), native coronary artery 09/24/2015  . Acute renal failure (ARF) ([place]) 09/24/2015  . Diabetes mellitus type 2, noninsulin dependent ([place]) 03/01/2008  . Hyperlipdiemia   . Hypertension   . History of kidney stones     Calvin Chura 05/11/2018, 10:16 AM  [place], [place], [address] Phone: [phone]   Fax:  [phone]  Name: Fernando Hess MRN: [id number] Date of Birth: August 04, 1946  Raeford Razor, PT 05/11/18 10:16 AM Phone: [phone] Fax: [phone]

## 2018-05-13 ENCOUNTER — Ambulatory Visit: Payer: Medicare Other | Admitting: Physical Therapy

## 2018-05-13 ENCOUNTER — Encounter: Payer: Self-pay | Admitting: Physical Therapy

## 2018-05-13 DIAGNOSIS — R262 Difficulty in walking, not elsewhere classified: Secondary | ICD-10-CM

## 2018-05-13 DIAGNOSIS — M79661 Pain in right lower leg: Secondary | ICD-10-CM

## 2018-05-13 DIAGNOSIS — R252 Cramp and spasm: Secondary | ICD-10-CM | POA: Diagnosis not present

## 2018-05-13 NOTE — Therapy (Signed)
[place], [place], [address] Phone: [phone]   Fax:  [phone]  Physical Therapy Treatment  Patient Details  Name: Fernando Hess MRN: [id number] Date of Birth: 01-01-1946 Referring Provider: Dr Harrington Challenger   Encounter Date: 05/13/2018  PT End of Session - 05/13/18 1230    Visit Number  3    Number of Visits  8    Date for PT Re-Evaluation  06/18/18    PT Start Time  1324    PT Stop Time  1233    PT Time Calculation (min)  48 min    Activity Tolerance  Patient tolerated treatment well    Behavior During Therapy  [facility] for tasks assessed/performed       Past Medical History:  Diagnosis Date  . Asthma   . CAD (coronary artery disease), native coronary artery 09/24/2015  . Diabetes mellitus without complication ([place])   . History of kidney stones   . Hyperlipidemia   . Hypertension   . Kidney stones   . ST elevation myocardial infarction (STEMI) of inferior wall ([place]) 10/22/2015    Past Surgical History:  Procedure Laterality Date  . APPENDECTOMY    . CARDIAC CATHETERIZATION N/A 09/26/2015   Procedure: Left Heart Cath and Coronary Angiography;  Surgeon: Troy Sine, MD;  Location: [place] CV LAB;  Service: Cardiovascular;  Laterality: N/A;  . CARDIAC CATHETERIZATION N/A 10/22/2015   Procedure: Left Heart Cath and Coronary Angiography;  Surgeon: Sherren Mocha, MD;  Location: [place] CV LAB;  Service: Cardiovascular;  Laterality: N/A;  . CATARACT EXTRACTION, BILATERAL    . CORONARY ARTERY BYPASS GRAFT N/A 10/02/2015   Procedure: CORONARY ARTERY BYPASS GRAFTING (CABG) X5 LIMA-LAD; SVG-DIAG; SVG-OM; SVG-PD; SVG-RAMUS TRANSESOPHAGEAL ECHOCARDIOGRAM (TEE) ENDOSCOPIC GREATER SAPHENOUS VEIN HARVEST BILAT LE;  Surgeon: Ivin Poot, MD;  Location: [place];  Service: Open Heart Surgery;  Laterality: N/A;  . LEFT HEART CATHETERIZATION WITH CORONARY ANGIOGRAM N/A 04/13/2014   Procedure: LEFT HEART  CATHETERIZATION WITH CORONARY ANGIOGRAM;  Surgeon: Jacolyn Reedy, MD;  Location[facility] C[facility] LAB;  Service: Cardiovascular;  Laterality: N/A;  . LEG SURGERY  72 yrs old   from fx. right  . TEE WITHOUT CARDIOVERSION N/A 10/02/2015   Procedure: TRANSESOPHAGEAL ECHOCARDIOGRAM (TEE);  Surgeon: Ivin Poot, MD;  Location: [place];  Service: Open Heart Surgery;  Laterality: N/A;    There were no vitals filed for this visit.  Subjective Assessment - 05/13/18 1152    Subjective  I walked .25 mile today and stretched before.     Currently in Pain?  No/denies              [facility] Adult PT Treatment/Exercise - 05/13/18 0001      Lumbar Exercises: Stretches   Lower Trunk Rotation  10 seconds    Lower Trunk Rotation Limitations  x 10     Piriformis Stretch  3 reps;30 seconds    Piriformis Stretch Limitations  seated and supine     Active Hamstring Stretch  Both;2 reps;30 seconds      Knee/Hip Exercises: Stretches   Press photographer  Both;2 reps;30 seconds    Soleus Stretch  Both;2 reps;30 seconds      Knee/Hip Exercises: Sidelying   Hip ABduction  Strengthening;Both;1 set;20 reps   needed manual cues      Ankle Exercises: Standing   Rocker Board  3 minutes    Rocker Board Limitations  added controlled balance exercises , needs  UE support , donw A/P then M/L     Heel Raises  Both;10 reps    Heel Raises Limitations  3 sets varied positions for muscle strength       Ankle Exercises: Stretches   Slant Board Stretch  2 reps;30 seconds                  PT Long Term Goals - 05/07/18 1048      PT LONG TERM GOAL #1   Title  Pt will be I with HEP for LE strength and flexibility.    Time  6    Period  Weeks    Status  New    Target Date  06/18/18      PT LONG TERM GOAL #2   Title  Pt will be able to walk for 2 miles as previous with min increase in pain, <3/10 on all levels of terrain     Time  6    Period  Weeks    Status  New    Target Date  06/18/18      PT LONG  TERM GOAL #3   Title  Pt will be able to walk without gait deviation (circumduction, abduction Rt LE) and no pain short distances in home, clinic and community     Time  6    Period  Weeks    Status  New    Target Date  06/18/18      PT LONG TERM GOAL #4   Title  FOTO score will improve to limited no more than 30% to demo functional improvement     Time  6    Period  Weeks    Status  New    Target Date  06/18/18            Plan - 05/13/18 1201    Clinical Impression Statement  Patient  continues to do well.  Incorporated balance into LE strength , pt will benefit from continued PT to improve balance, hip and ankle mobility.  May only need about 2-3 more visits    PT Treatment/Interventions  ADLs/Self Care Home Management;Electrical Stimulation;Functional mobility training;Taping;Therapeutic activities;Iontophoresis 4mg /ml Dexamethasone;Moist Heat;Ultrasound;Cryotherapy;Gait training;Stair training;Therapeutic exercise;Balance training;Patient/family education;Manual techniques;Dry needling;Passive range of motion    PT Next Visit Plan  balance, try incline on TM, step ups?     PT Home Exercise Plan  gastoc/soleus at wall and hamstring with strap , hip abduction     Consulted and Agree with Plan of Care  Patient       Patient will benefit from skilled therapeutic intervention in order to improve the following deficits and impairments:  Abnormal gait, Decreased balance, Decreased mobility, Difficulty walking, Increased muscle spasms, Decreased range of motion, Decreased strength, Increased fascial restricitons, Impaired flexibility, Postural dysfunction, Pain  Visit Diagnosis: Cramp and spasm  Difficulty in walking, not elsewhere classified  Pain in right lower leg     Problem List Patient Active Problem List   Diagnosis Date Noted  . CAD (coronary artery disease) of artery bypass graft   . 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 Elevated troponin   . CAD (coronary artery disease),  native coronary artery 09/24/2015  . Acute renal failure (ARF) ([place]) 09/24/2015  . Diabetes mellitus type 2, noninsulin dependent ([place]) 03/01/2008  . Hyperlipdiemia   . Hypertension   . History of kidney stones     PAA,JENNIFER 05/13/2018, 1:05 PM  [place],  [place], [address] Phone: [phone]   Fax:  [phone]  Name: Fernando Hess MRN: [id number] Date of Birth: January 06, 1946  Raeford Razor, PT 05/13/18 1:05 PM Phone: [phone] Fax: [phone]

## 2018-05-19 ENCOUNTER — Encounter: Payer: Self-pay | Admitting: Physical Therapy

## 2018-05-19 ENCOUNTER — Ambulatory Visit: Payer: Medicare Other | Attending: Family Medicine | Admitting: Physical Therapy

## 2018-05-19 DIAGNOSIS — M79661 Pain in right lower leg: Secondary | ICD-10-CM | POA: Insufficient documentation

## 2018-05-19 DIAGNOSIS — R262 Difficulty in walking, not elsewhere classified: Secondary | ICD-10-CM | POA: Insufficient documentation

## 2018-05-19 DIAGNOSIS — R252 Cramp and spasm: Secondary | ICD-10-CM | POA: Diagnosis not present

## 2018-05-19 NOTE — Patient Instructions (Addendum)
  You can also turn toes inward and repeat each of the exercises leading with the heel.

## 2018-05-19 NOTE — Therapy (Signed)
[place], [place], [address] Phone: [phone]   Fax:  [phone]  Physical Therapy Treatment Discharge  Patient Details  Name: Fernando Hess MRN: [id number] Date of Birth: Nov 18, 1945 Referring Provider: Dr. Harrington Challenger   Encounter Date: 05/19/2018  PT End of Session - 05/19/18 1217    Visit Number  4    Number of Visits  8    Date for PT Re-Evaluation  06/18/18    PT Start Time  1100    PT Stop Time  1150    PT Time Calculation (min)  50 min    Activity Tolerance  Patient tolerated treatment well    Behavior During Therapy  [facility] for tasks assessed/performed       Past Medical History:  Diagnosis Date  . Asthma   . CAD (coronary artery disease), native coronary artery 09/24/2015  . Diabetes mellitus without complication ([place])   . History of kidney stones   . Hyperlipidemia   . Hypertension   . Kidney stones   . ST elevation myocardial infarction (STEMI) of inferior wall ([place]) 10/22/2015    Past Surgical History:  Procedure Laterality Date  . APPENDECTOMY    . CARDIAC CATHETERIZATION N/A 09/26/2015   Procedure: Left Heart Cath and Coronary Angiography;  Surgeon: Troy Sine, MD;  Location: [place] CV LAB;  Service: Cardiovascular;  Laterality: N/A;  . CARDIAC CATHETERIZATION N/A 10/22/2015   Procedure: Left Heart Cath and Coronary Angiography;  Surgeon: Sherren Mocha, MD;  Location: [place] CV LAB;  Service: Cardiovascular;  Laterality: N/A;  . CATARACT EXTRACTION, BILATERAL    . CORONARY ARTERY BYPASS GRAFT N/A 10/02/2015   Procedure: CORONARY ARTERY BYPASS GRAFTING (CABG) X5 LIMA-LAD; SVG-DIAG; SVG-OM; SVG-PD; SVG-RAMUS TRANSESOPHAGEAL ECHOCARDIOGRAM (TEE) ENDOSCOPIC GREATER SAPHENOUS VEIN HARVEST BILAT LE;  Surgeon: Ivin Poot, MD;  Location: [place];  Service: Open Heart Surgery;  Laterality: N/A;  . LEFT HEART CATHETERIZATION WITH CORONARY ANGIOGRAM N/A 04/13/2014   Procedure: LEFT HEART  CATHETERIZATION WITH CORONARY ANGIOGRAM;  Surgeon: Jacolyn Reedy, MD;  Location: [facility] CATH LAB;  Service: Cardiovascular;  Laterality: N/A;  . LEG SURGERY  72 yrs old   from fx. right  . TEE WITHOUT CARDIOVERSION N/A 10/02/2015   Procedure: TRANSESOPHAGEAL ECHOCARDIOGRAM (TEE);  Surgeon: Ivin Poot, MD;  Location: [place];  Service: Open Heart Surgery;  Laterality: N/A;    There were no vitals filed for this visit.      [facility] PT Assessment - 05/19/18 0001      Assessment   Medical Diagnosis  R calf pain    Referring Provider  Dr. Harrington Challenger      Observation/Other Assessments   Focus on Therapeutic Outcomes (FOTO)   59% limitation      Strength   Right Hip Flexion  4/5    Right Hip ABduction  4-/5    Left Hip Flexion  5/5    Left Hip ABduction  4-/5    Right Ankle Plantar Flexion  4+/5    Left Ankle Plantar Flexion  4+/5      Ambulation/Gait   Gait Comments  Pt with no obvious hip abduction during gait.       Berg Balance Test   Sit to Stand  Able to stand without using hands and stabilize independently    Standing Unsupported  Able to stand safely 2 minutes    Sitting with Back Unsupported but Feet Supported on Floor or Stool  Able to  sit safely and securely 2 minutes    Stand to Sit  Sits safely with minimal use of hands    Transfers  Able to transfer safely, minor use of hands    Standing Unsupported with Eyes Closed  Able to stand 10 seconds safely    Standing Ubsupported with Feet Together  Able to place feet together independently and stand 1 minute safely    From Standing, Reach Forward with Outstretched Arm  Can reach confidently >25 cm (10")    From Standing Position, Pick up Object from Floor  Able to pick up shoe safely and easily    From Standing Position, Turn to Look Behind Over each Shoulder  Looks behind from both sides and weight shifts well    Turn 360 Degrees  Able to turn 360 degrees safely in 4 seconds or less    Standing Unsupported, Alternately Place Feet  on Step/Stool  Able to stand independently and safely and complete 8 steps in 20 seconds    Standing Unsupported, One Foot in Front  Able to plae foot ahead of the other independently and hold 30 seconds    Standing on One Leg  Able to lift leg independently and hold 5-10 seconds    Total Score  54    Berg comment:  54/56      High Level Balance   High Level Balance Comments  R SLS: 6 seconds, L SLS 5 seconds                   [facility] Adult PT Treatment/Exercise - 05/19/18 0001      Ambulation/Gait   Ambulation Distance (Feet)  200 Feet    Assistive device  None    Gait Pattern  Within Functional Limits      Knee/Hip Exercises: Sidelying   Hip ABduction  Strengthening;Both;1 set;20 reps   edu pt on standing and s/l with toes forward and turned down            PT Education - 05/19/18 1216    Education Details  HEP reviewed and added s/l and standing hip abduction    Person(s) Educated  Patient    Methods  Explanation;Handout;Demonstration    Comprehension  Verbalized understanding;Returned demonstration          PT Long Term Goals - 05/19/18 1218      PT LONG TERM GOAL #1   Title  Pt will be I with HEP for LE strength and flexibility.    Baseline  pt is walking 2 miles a day    Time  6    Period  Weeks    Status  Achieved      PT LONG TERM GOAL #2   Title  Pt will be able to walk for 2 miles as previous with min increase in pain, <3/10 on all levels of terrain     Time  6    Period  Weeks      PT LONG TERM GOAL #3   Title  Pt will be able to walk without gait deviation (circumduction, abduction Rt LE) and no pain short distances in home, clinic and community     Period  Weeks    Status  New      PT LONG TERM GOAL #4   Title  FOTO score will improve to limited no more than 30% to demo functional improvement     Baseline  Pt with 59% limitation, but reporting feeling much better and being  able to walk further. Pt released by his MD and pt wishing to  discontinue skilled PT.     Time  6    Period  Weeks    Status  Not Met            Plan - 05/19/18 1221    Clinical Impression Statement  Pt reporting being released by his MD. Pt reporting no pain in his LE. Pt reporting walking 2 miles each and day and returning to his PLOF. Pt has improve his strength and BERG balance score since beginning therapy.  Pt has met 3/4 goals and is ready to discharge from PT  services. Pt feels like he can continue to work on his HEP and continue to improve on his own.     Rehab Potential  Excellent    PT Frequency  2x / week    PT Duration  6 weeks    PT Treatment/Interventions  ADLs/Self Care Home Management;Electrical Stimulation;Functional mobility training;Taping;Therapeutic activities;Iontophoresis 80m/ml Dexamethasone;Moist Heat;Ultrasound;Cryotherapy;Gait training;Stair training;Therapeutic exercise;Balance training;Patient/family education;Manual techniques;Dry needling;Passive range of motion    PT Next Visit Plan  balance, try incline on TM, step ups?     PT Home Exercise Plan  gastoc/soleus at wall and hamstring with strap , hip abduction     Consulted and Agree with Plan of Care  Patient       Patient will benefit from skilled therapeutic intervention in order to improve the following deficits and impairments:  Abnormal gait, Decreased balance, Decreased mobility, Difficulty walking, Increased muscle spasms, Decreased range of motion, Decreased strength, Increased fascial restricitons, Impaired flexibility, Postural dysfunction, Pain  Visit Diagnosis: Cramp and spasm  Difficulty in walking, not elsewhere classified  Pain in right lower leg     Problem List Patient Active Problem List   Diagnosis Date Noted  . CAD (coronary artery disease) of artery bypass graft   . 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 Elevated troponin   . CAD (coronary artery disease), native coronary artery 09/24/2015  . Acute renal failure (ARF) ([place] 09/24/2015  .  Diabetes mellitus type 2, noninsulin dependent [place] 03/01/2008  . Hyperlipdiemia   . Hypertension   . History of kidney stones     JOretha Caprice MPT 05/19/2018, 12:34 PM  [facility] [address]Phone: [phone]  Fax:  [phone] Name: Fernando LENNMRN: [id number] of Birth: 7Apr 05, 1947

## 2018-05-21 ENCOUNTER — Ambulatory Visit: Payer: Medicare Other | Admitting: Physical Therapy

## 2018-05-25 ENCOUNTER — Ambulatory Visit: Payer: Medicare Other | Admitting: Physical Therapy

## 2018-05-27 ENCOUNTER — Encounter: Payer: Self-pay | Admitting: Physician Assistant

## 2018-05-28 ENCOUNTER — Ambulatory Visit: Payer: Medicare Other | Admitting: Physical Therapy

## 2018-06-01 ENCOUNTER — Ambulatory Visit: Payer: Medicare Other | Admitting: Physical Therapy

## 2018-06-04 ENCOUNTER — Encounter: Payer: Medicare Other | Admitting: Physical Therapy

## 2018-06-09 ENCOUNTER — Ambulatory Visit (INDEPENDENT_AMBULATORY_CARE_PROVIDER_SITE_OTHER): Payer: Medicare Other | Admitting: Physician Assistant

## 2018-06-09 ENCOUNTER — Encounter: Payer: Self-pay | Admitting: Physician Assistant

## 2018-06-09 VITALS — BP 118/76 | HR 79 | Ht 66.0 in | Wt 173.8 lb

## 2018-06-09 DIAGNOSIS — E119 Type 2 diabetes mellitus without complications: Secondary | ICD-10-CM

## 2018-06-09 DIAGNOSIS — I25810 Atherosclerosis of coronary artery bypass graft(s) without angina pectoris: Secondary | ICD-10-CM | POA: Diagnosis not present

## 2018-06-09 DIAGNOSIS — E785 Hyperlipidemia, unspecified: Secondary | ICD-10-CM | POA: Diagnosis not present

## 2018-06-09 DIAGNOSIS — I491 Atrial premature depolarization: Secondary | ICD-10-CM

## 2018-06-09 DIAGNOSIS — I10 Essential (primary) hypertension: Secondary | ICD-10-CM | POA: Diagnosis not present

## 2018-06-09 NOTE — Patient Instructions (Signed)
Medication Instructions:  1. Your physician recommends that you continue on your current medications as directed. Please refer to the Current Medication list given to you today.   Labwork: NONE ORDERED TODAY  Testing/Procedures: Your physician has recommended that you wear a 48 holter monitor. Holter monitors are medical devices that record the heart's electrical activity. Doctors most often use these monitors to diagnose arrhythmias. Arrhythmias are problems with the speed or rhythm of the heartbeat. The monitor is a small, portable device. You can wear one while you do your normal daily activities. This is usually used to diagnose what is causing palpitations/syncope (passing out).    Follow-Up: 6 MONTHS WITH DR. Meda Coffee  Any Other Special Instructions Will Be Listed Below (If Applicable).     If you need a refill on your cardiac medications before your next appointment, please call your pharmacy.

## 2018-06-09 NOTE — Progress Notes (Signed)
Cardiology Office Note:    Date:  06/09/2018   ID:  Fernando Hess, DOB 07-29-1946, MRN [id number]  PCP:  Fernando Cruel, MD  Cardiologist:  Ena Dawley, MD (patient request)  Referring MD: Fernando Cruel, MD   Chief Complaint  Patient presents with  . Coronary Artery Disease     History of Present Illness:    Fernando Hess is a 72 y.o. male with coronary artery disease status post CABG, diabetes, hypertension, hyperlipidemia, chronic kidney disease who is being seen today to establish with Cardiology at the request of Fernando Cruel, MD.   [name] suffered a myocardial infarction in January 2017 and underwent multivessel CABG.  Postoperative course was complicated by atrial fibrillation treated with amiodarone.  He presented back to the hospital in 10/2015 with an inferolateral ST elevation myocardial infarction.  He had significant hyperkalemia and nausea and vomiting.  Amiodarone was stopped secondary to nausea and vomiting.  Cardiac catheterization demonstrated an occluded SVG-OM1 and occluded SVG-OM2.  His distal OM was occluded.  The distal OM occlusion was felt to be the culprit for his myocardial infarction.  He had diabetic appearing vessels and medical therapy was recommended.    He was previously followed by Dr. Wynonia Hess.  He is now transferring to our office.  He was last seen by Dr. Wynonia Hess in 10/2017.  He has not had any chest pain, shortness of breath, fatigue, orthopnea, paroxysmal nocturnal dyspnea, leg swelling, syncope. He does complain of headaches.  He had headaches prior to his myocardial infarction but they were much different.  He denies any associated symptoms.  I have asked him to follow up with primary care for his headaches.    PAD Screen 06/09/2018  Previous PAD dx? No  Previous surgical procedure? No  Pain with walking? No  Feet/toe relief with dangling? No  Painful, non-healing ulcers? No  Extremities discolored? No    Prior CV studies:     The following studies were reviewed today:  Echocardiogram 10/23/2015 EF 55-60, normal wall motion, mildly calcified aortic valve leaflets, mild AI, mild to moderate MR  Cardiac catheterization 10/22/2015 LM ostial 50 LAD mid 90 RI ostial 90 LCx proximal 80; OM1 80; OM2 100 (culprit for MI) RCA mid 50 LIMA-LAD patent SVG-RPDA patent SVG-D1 patent SVG-OM1 100 SVG-OM2 100 Medical therapy  Pre-CABG Dopplers 09/28/2015 Summary: Findings suggest 1-39% internal carotid artery stenosis bilaterally. Vertebral arteries are patent with antegrade flow. Bilateral ABIs are within normal limits.   Past Medical History:  Diagnosis Date  . Asthma   . CAD (coronary artery disease), native coronary artery 09/24/2015  . Diabetes mellitus without complication ([place])   . History of kidney stones   . Hyperlipidemia   . Hypertension   . ST elevation myocardial infarction (STEMI) of inferior wall (Fernando Hess) 10/22/2015    Past Surgical History:  Procedure Laterality Date  . APPENDECTOMY    . CARDIAC CATHETERIZATION N/A 09/26/2015   Procedure: Left Heart Cath and Coronary Angiography;  Surgeon: Fernando Sine, MD;  Location: [facility];  Service: Cardiovascular;  Laterality: N/A;  . CARDIAC CATHETERIZATION N/A 10/22/2015   Procedure: Left Heart Cath and Coronary Angiography;  Surgeon: Fernando Mocha, MD;  Location: [facility];  Service: Cardiovascular;  Laterality: N/A;  . CATARACT EXTRACTION, BILATERAL    . CORONARY ARTERY BYPASS GRAFT N/A 10/02/2015   Procedure: CORONARY ARTERY BYPASS GRAFTING (CABG) X5 LIMA-LAD; SVG-DIAG; SVG-OM; SVG-PD; SVG-RAMUS TRANSESOPHAGEAL ECHOCARDIOGRAM (TEE) ENDOSCOPIC GREATER SAPHENOUS VEIN  HARVEST BILAT LE;  Surgeon: Fernando Poot, MD;  Location: [facility];  Service: Open Heart Surgery;  Laterality: N/A;  . LEFT HEART CATHETERIZATION WITH CORONARY ANGIOGRAM N/A 04/13/2014   Procedure: LEFT HEART CATHETERIZATION WITH CORONARY ANGIOGRAM;  Surgeon: Fernando Reedy, MD;   Location: [facility] C[facility];  Service: Cardiovascular;  Laterality: N/A;  . LEG SURGERY  72 yrs old   from fx. right  . TEE WITHOUT CARDIOVERSION N/A 10/02/2015   Procedure: TRANSESOPHAGEAL ECHOCARDIOGRAM (TEE);  Surgeon: Fernando Poot, MD;  Location: [facility];  Service: Open Heart Surgery;  Laterality: N/A;    Current Medications: Current Meds  Medication Sig  . aspirin 81 MG chewable tablet Chew 1 tablet (81 mg total) by mouth daily.  [name] atorvastatin (LIPITOR) 40 MG tablet Take 40 mg by mouth daily.  . DULoxetine (CYMBALTA) 60 MG capsule Take 1 capsule by mouth daily.  [name] glimepiride (AMARYL) 4 MG tablet Take 4 mg by mouth 2 (two) times daily.  . Multiple Vitamin (MULTIVITAMIN) tablet Take 1 tablet by mouth daily.  . nitroGLYCERIN (NITROSTAT) 0.4 MG SL tablet Place 0.4 mg under the tongue every 5 (five) minutes as needed for chest pain.  [name] ondansetron (ZOFRAN-ODT) 4 MG disintegrating tablet Take 4 mg by mouth every 8 (eight) hours as needed for nausea or vomiting. Pt states he takes at home when he feels nauseous  . pioglitazone (ACTOS) 15 MG tablet Take 15 mg by mouth daily.  . sitaGLIPtin (JANUVIA) 50 MG tablet Take 50 mg by mouth daily.     Allergies:   Actos [pioglitazone]; Levaquin [levofloxacin]; and Lisinopril   Social History   Socioeconomic History  . Marital status: Married    Spouse name: Not on file  . Number of children: Not on file  . Years of education: Not on file  . Highest education level: Not on file  Occupational History  . Occupation: retired  Scientific laboratory technician  . Financial resource strain: Not on file  . Food insecurity:    Worry: Not on file    Inability: Not on file  . Transportation needs:    Medical: Not on file    Non-medical: Not on file  Tobacco Use  . Smoking status: Never Smoker  . Smokeless tobacco: Never Used  Substance and Sexual Activity  . Alcohol use: No    Comment: 1 beer/wine every 2-3 months per patient  . Drug use: No  . Sexual activity: Not  on file  Lifestyle  . Physical activity:    Days per week: Not on file    Minutes per session: Not on file  . Stress: Not on file  Relationships  . Social connections:    Talks on phone: Not on file    Gets together: Not on file    Attends religious service: Not on file    Active member of club or organization: Not on file    Attends meetings of clubs or organizations: Not on file    Relationship status: Not on file  Other Topics Concern  . 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 There is no history of Colon cancer, Esophageal cancer, Prostate cancer, Rectal cancer, or Stomach cancer.  ROS:   Please see the  history of present illness.    Review of Systems  Eyes: Positive for visual disturbance.  Respiratory: Positive for cough.   Neurological: Positive for headaches.   All other systems reviewed and are negative.   EKGs/Labs/Other Test Reviewed:    EKG:  EKG is  ordered today.  The ekg ordered today demonstrates normal sinus rhythm, HR 79, RBBB, PACs and Atrial Runs, 1.6 second pause noted, QTc 463.  Question 2nd degree type 1 vs atrial tachy.  Recent Labs: No results found for requested labs within last 8760 hours.   Recent Lipid Panel Lab Results  Component Value Date/Time   CHOL 81 10/23/2015 05:15 AM   TRIG 102 10/23/2015 05:15 AM   HDL 30 (L) 10/23/2015 05:15 AM   CHOLHDL 2.7 10/23/2015 05:15 AM   LDLCALC 31 10/23/2015 05:15 AM   From KPN Tool: Cholesterol, total 114.000 03/10/2018 HDL 50.000 03/10/2018 LDL 52.000 03/10/2018 Triglycerides 60.000 03/10/2018 A1C 8.800 03/10/2018 Hemoglobin 14.500 03/10/2018 Creatinine, Serum 2.020 04/09/2018 Potassium 5.100 04/09/2018 ALT (SGPT) 28.000 04/09/2018 TSH 0.468 10/26/2015 Platelets 173.000  12/01/2016  Physical Exam:    VS:  BP 118/76   Pulse 79   Ht 5\' 6"  (1.676 m)   Wt 173 lb 12.8 oz (78.8 kg)   SpO2 99%   BMI 28.05 kg/m     Wt Readings from Last 3 Encounters:  06/09/18 173 lb 12.8 oz (78.8 kg)  12/01/16 163 lb (73.9 kg)  10/07/16 165 lb (74.8 kg)     Physical Exam  Constitutional: He is oriented to person, place, and time. He appears well-developed and well-nourished. No distress.  HENT:  Head: Normocephalic and atraumatic.  Neck: No JVD present. Carotid bruit is not present. No thyromegaly present.  Cardiovascular: Normal rate, regular rhythm and normal heart sounds.  No murmur heard. Pulmonary/Chest: Effort normal and breath sounds normal. He has no rales.  Abdominal: Soft. There is no hepatomegaly.  Musculoskeletal: He exhibits no edema.  Lymphadenopathy:    He has no cervical adenopathy.  Neurological: He is alert and oriented to person, place, and time.  Skin: Skin is warm and dry.  Psychiatric: He has a normal mood and affect.    ASSESSMENT & PLAN:    Coronary artery disease involving coronary bypass graft of native heart without angina pectoris Hx of CABG in 2017.  He returned to the hospital several weeks later with an inferolateral STEMI.  The vein grafts to both OMs were occluded and the culprit was a distal OM occlusion.  He was treated medically.  He had a follow up ETT with Dr. Wynonia Hess in 12/2015 that was electrically positive but clinically negative.  He has not had any recurrent angina.  He has had some headaches and he notes headaches prior to his myocardial infarction.  I have asked him to follow up with primary care.  If he has progressive symptoms reminiscent of his prior angina, he should call for earlier follow up.    -Continue ASA, statin.  Essential hypertension  BP controlled on no medications.  Hyperlipidemia, unspecified hyperlipidemia type  LDL optimal on most recent lab work.  Continue current Rx.    Diabetes mellitus type 2,  noninsulin dependent ([place])  Continue follow up with primary care.  He is being referred to Endocrinology.  Empagliflozin could be considered given the results of the EMPA-REG OUTCOME trial.   Premature atrial beats I reviewed his EKG today with Dr. Johnsie Cancel (attending MD).  The patient is not symptomatic.  Question if he  is having a lot of fast atrial rhythms.  If so, he would benefit from starting back on beta-blocker therapy.  -Obtain 48 Hr Holter  -If he is having a lot of atrial tach/SVT, start Toprol 25 QD   Dispo:  Return in about 6 months (around 12/08/2018) for Routine Follow Up w/ Dr. Meda Coffee.   Medication Adjustments/Labs and Tests Ordered: Current medicines are reviewed at length with the patient today.  Concerns regarding medicines are outlined above.  Orders/Tests:  Orders Placed This Encounter  Procedures  . Holter monitor - 48 hour  . EKG 12-Lead   Medication changes: No orders of the defined types were placed in this encounter.  Signed, [name], [name]  06/09/2018 5:31 PM    [facility] [place], [place], [place]  [address] Phone: [phone]; Fax: [phone]

## 2018-06-11 ENCOUNTER — Ambulatory Visit (INDEPENDENT_AMBULATORY_CARE_PROVIDER_SITE_OTHER): Payer: Medicare Other

## 2018-06-11 DIAGNOSIS — I491 Atrial premature depolarization: Secondary | ICD-10-CM | POA: Diagnosis not present

## 2018-06-18 ENCOUNTER — Telehealth: Payer: Self-pay

## 2018-06-18 NOTE — Telephone Encounter (Signed)
-----   Message from Liliane Shi, [place] sent at 06/18/2018  1:57 PM EDT ----- The holter monitor shows frequent episodes of extra beats from the top part of the heart and some runs of fast heart beats from the top part of the heart. I think he should be back on a beta-blocker.  Recommendations:  - Start Toprol XL 25 mg QD [name], [name]    06/18/2018 1:52 PM

## 2018-06-18 NOTE — Telephone Encounter (Signed)
Notes recorded by Frederik Schmidt, RN on 06/18/2018 at 2:13 PM EDT lpmtcb 10/4 ------

## 2018-06-21 ENCOUNTER — Telehealth: Payer: Self-pay

## 2018-06-21 MED ORDER — METOPROLOL SUCCINATE ER 25 MG PO TB24: 25.0000 mg | ORAL_TABLET | Freq: Every day | ORAL | 3 refills | Status: DC

## 2018-06-21 NOTE — Telephone Encounter (Signed)
Notes recorded by Frederik Schmidt, RN on 06/21/2018 at 8:36 AM EDT The patient has been notified of the result and verbalized understanding. All questions (if any) were answered. Toprol XL 25mg  started. Frederik Schmidt, RN 06/21/2018 8:35 AM

## 2018-06-21 NOTE — Telephone Encounter (Signed)
-----   Message from Liliane Shi, [place] sent at 06/18/2018  1:57 PM EDT ----- The holter monitor shows frequent episodes of extra beats from the top part of the heart and some runs of fast heart beats from the top part of the heart. I think he should be back on a beta-blocker.  Recommendations:  - Start Toprol XL 25 mg QD [name], [name]    06/18/2018 1:52 PM

## 2018-08-06 ENCOUNTER — Other Ambulatory Visit: Payer: Self-pay | Admitting: Nephrology

## 2018-08-06 DIAGNOSIS — N189 Chronic kidney disease, unspecified: Secondary | ICD-10-CM

## 2018-08-06 DIAGNOSIS — E785 Hyperlipidemia, unspecified: Secondary | ICD-10-CM

## 2018-08-06 DIAGNOSIS — I2583 Coronary atherosclerosis due to lipid rich plaque: Secondary | ICD-10-CM

## 2018-08-06 DIAGNOSIS — I4891 Unspecified atrial fibrillation: Secondary | ICD-10-CM

## 2018-08-06 DIAGNOSIS — I129 Hypertensive chronic kidney disease with stage 1 through stage 4 chronic kidney disease, or unspecified chronic kidney disease: Secondary | ICD-10-CM

## 2018-08-06 DIAGNOSIS — N183 Chronic kidney disease, stage 3 unspecified: Secondary | ICD-10-CM

## 2018-08-06 DIAGNOSIS — D631 Anemia in chronic kidney disease: Secondary | ICD-10-CM

## 2018-08-06 DIAGNOSIS — Z794 Long term (current) use of insulin: Secondary | ICD-10-CM

## 2018-08-06 DIAGNOSIS — E0822 Diabetes mellitus due to underlying condition with diabetic chronic kidney disease: Secondary | ICD-10-CM

## 2018-08-06 DIAGNOSIS — N2581 Secondary hyperparathyroidism of renal origin: Secondary | ICD-10-CM

## 2018-08-06 DIAGNOSIS — I2510 Atherosclerotic heart disease of native coronary artery without angina pectoris: Secondary | ICD-10-CM

## 2018-08-07 ENCOUNTER — Emergency Department (HOSPITAL_COMMUNITY)
Admission: EM | Admit: 2018-08-07 | Discharge: 2018-08-07 | Disposition: A | Discharge status: Home / Self Care | Payer: Medicare Other | Attending: Emergency Medicine | Admitting: Emergency Medicine

## 2018-08-07 ENCOUNTER — Emergency Department (HOSPITAL_COMMUNITY): Payer: Medicare Other

## 2018-08-07 ENCOUNTER — Other Ambulatory Visit: Payer: Self-pay

## 2018-08-07 ENCOUNTER — Encounter (HOSPITAL_COMMUNITY): Payer: Self-pay | Admitting: Emergency Medicine

## 2018-08-07 DIAGNOSIS — Z7982 Long term (current) use of aspirin: Secondary | ICD-10-CM | POA: Insufficient documentation

## 2018-08-07 DIAGNOSIS — Z79899 Other long term (current) drug therapy: Secondary | ICD-10-CM | POA: Diagnosis not present

## 2018-08-07 DIAGNOSIS — R1030 Lower abdominal pain, unspecified: Secondary | ICD-10-CM | POA: Diagnosis present

## 2018-08-07 DIAGNOSIS — E119 Type 2 diabetes mellitus without complications: Secondary | ICD-10-CM | POA: Insufficient documentation

## 2018-08-07 DIAGNOSIS — N200 Calculus of kidney: Secondary | ICD-10-CM | POA: Diagnosis not present

## 2018-08-07 DIAGNOSIS — I10 Essential (primary) hypertension: Secondary | ICD-10-CM | POA: Insufficient documentation

## 2018-08-07 LAB — COMPREHENSIVE METABOLIC PANEL
ALK PHOS: 59 U/L (ref 38–126)
ALT: 30 U/L (ref 0–44)
ANION GAP: 8 (ref 5–15)
AST: 27 U/L (ref 15–41)
Albumin: 3.9 g/dL (ref 3.5–5.0)
BILIRUBIN TOTAL: 1.3 mg/dL — AB (ref 0.3–1.2)
BUN: 37 mg/dL — ABNORMAL HIGH (ref 8–23)
CALCIUM: 9.3 mg/dL (ref 8.9–10.3)
CO2: 24 mmol/L (ref 22–32)
CREATININE: 2.32 mg/dL — AB (ref 0.61–1.24)
Chloride: 103 mmol/L (ref 98–111)
GFR, EST AFRICAN AMERICAN: 30 mL/min — AB (ref >60)
GFR, EST NON AFRICAN AMERICAN: 26 mL/min — AB (ref >60)
Glucose, Bld: 168 mg/dL — ABNORMAL HIGH (ref 70–99)
Potassium: 4.7 mmol/L (ref 3.5–5.1)
SODIUM: 135 mmol/L (ref 135–145)
TOTAL PROTEIN: 6.4 g/dL — AB (ref 6.5–8.1)

## 2018-08-07 LAB — URINALYSIS, ROUTINE W REFLEX MICROSCOPIC
BILIRUBIN URINE: NEGATIVE
Glucose, UA: NEGATIVE mg/dL
Ketones, ur: NEGATIVE mg/dL
LEUKOCYTES UA: NEGATIVE
NITRITE: NEGATIVE
PH: 6 (ref 5.0–8.0)
Protein, ur: 100 mg/dL — AB
SPECIFIC GRAVITY, URINE: 1.016 (ref 1.005–1.030)
WBC, UA: >50 WBC/hpf — ABNORMAL HIGH (ref 0–5)

## 2018-08-07 LAB — CBC
HCT: 46.4 % (ref 39.0–52.0)
Hemoglobin: 14.3 g/dL (ref 13.0–17.0)
MCH: 29.1 pg (ref 26.0–34.0)
MCHC: 30.8 g/dL (ref 30.0–36.0)
MCV: 94.3 fL (ref 80.0–100.0)
NRBC: 0 % (ref 0.0–0.2)
PLATELETS: 188 10*3/uL (ref 150–400)
RBC: 4.92 MIL/uL (ref 4.22–5.81)
RDW: 14.2 % (ref 11.5–15.5)
WBC: 7.4 10*3/uL (ref 4.0–10.5)

## 2018-08-07 LAB — LIPASE, BLOOD: LIPASE: 122 U/L — AB (ref 11–51)

## 2018-08-07 MED ORDER — SODIUM CHLORIDE 0.9 % IV SOLN: INTRAVENOUS | Status: DC

## 2018-08-07 MED ORDER — OXYCODONE-ACETAMINOPHEN 5-325 MG PO TABS: 1.0000 | ORAL_TABLET | Freq: Four times a day (QID) | ORAL | 0 refills | Status: DC | PRN

## 2018-08-07 MED ORDER — SODIUM CHLORIDE 0.9 % IV BOLUS
500.0000 mL | Freq: Once | INTRAVENOUS | Status: AC
  Administered 2018-08-07: 500 mL via INTRAVENOUS

## 2018-08-07 MED ORDER — FENTANYL CITRATE (PF) 100 MCG/2ML IJ SOLN
25.0000 ug | Freq: Once | INTRAMUSCULAR | Status: AC
  Administered 2018-08-07: 25 ug via INTRAVENOUS
  Filled 2018-08-07: qty 2

## 2018-08-07 MED ORDER — FENTANYL CITRATE (PF) 100 MCG/2ML IJ SOLN
25.0000 ug | INTRAMUSCULAR | Status: DC | PRN
  Administered 2018-08-07: 25 ug via INTRAVENOUS
  Filled 2018-08-07: qty 2

## 2018-08-07 NOTE — Discharge Instructions (Signed)
As discussed, kidney stones have been identified in your ureter, as well as in your kidneys on both sides. Please be sure to schedule follow-up with your urologist.  Return here for any concerning changes in your condition.

## 2018-08-07 NOTE — ED Triage Notes (Signed)
Pt states he began having lower abdominal pain this afternoon all the way across. No n/v/d. No urinary symptoms. No fever or chills.

## 2018-08-07 NOTE — ED Provider Notes (Addendum)
[facility] EMERGENCY DEPARTMENT Provider Note   CSN: [id number] Arrival date & time: 08/07/18  1934     History   Chief Complaint Chief Complaint  Patient presents with  . Abdominal Pain    HPI Fernando Hess is a 72 y.o. male.  HPI Presents with abdominal pain. Onset was earlier today, subtle. Since onset pain is been persistent, severe, 12/10. Patient has multiple medical issues including nephrolithiasis, CAD, hypertension. No relief with ibuprofen taken earlier today. There is nausea, no vomiting, no diarrhea. Pain is focally throughout the lower abdomen, equal in both sides. Patient is here with his wife who assists with the HPI. After presenting to urgent care he was sent here for evaluation. Past Medical History:  Diagnosis Date  . Asthma   . CAD (coronary artery disease), native coronary artery 09/24/2015  . Diabetes mellitus without complication ([place])   . History of kidney stones   . Hyperlipidemia   . Hypertension   . ST elevation myocardial infarction (STEMI) of inferior wall ([place]) 10/22/2015    Patient Active Problem List   Diagnosis Date Noted  . CAD (coronary artery disease) of artery bypass graft   . 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 Elevated troponin   . CAD (coronary artery disease), native coronary artery 09/24/2015  . Acute renal failure (ARF) ([place]) 09/24/2015  . Diabetes mellitus type 2, noninsulin dependent ([place]) 03/01/2008  . Hyperlipidemia, unspecified   . Essential hypertension   . History of kidney stones     Past Surgical History:  Procedure Laterality Date  . APPENDECTOMY    . CARDIAC CATHETERIZATION N/A 09/26/2015   Procedure: Left Heart Cath and Coronary Angiography;  Surgeon: Troy Sine, MD;  Location: [facility];  Service: Cardiovascular;  Laterality: N/A;  . CARDIAC CATHETERIZATION N/A 10/22/2015   Procedure: Left Heart Cath and Coronary Angiography;  Surgeon: Sherren Mocha, MD;  Location: [facility];   Service: Cardiovascular;  Laterality: N/A;  . CATARACT EXTRACTION, BILATERAL    . CORONARY ARTERY BYPASS GRAFT N/A 10/02/2015   Procedure: CORONARY ARTERY BYPASS GRAFTING (CABG) X5 LIMA-LAD; SVG-DIAG; SVG-OM; SVG-PD; SVG-RAMUS TRANSESOPHAGEAL ECHOCARDIOGRAM (TEE) ENDOSCOPIC GREATER SAPHENOUS VEIN HARVEST BILAT LE;  Surgeon: Ivin Poot, MD;  Location: [facility];  Service: Open Heart Surgery;  Laterality: N/A;  . LEFT HEART CATHETERIZATION WITH CORONARY ANGIOGRAM N/A 04/13/2014   Procedure: LEFT HEART CATHETERIZATION WITH CORONARY ANGIOGRAM;  Surgeon: Jacolyn Reedy, MD;  Location: [facility] CATH LAB;  Service: Cardiovascular;  Laterality: N/A;  . LEG SURGERY  72 yrs old   from fx. right  . TEE WITHOUT CARDIOVERSION N/A 10/02/2015   Procedure: TRANSESOPHAGEAL ECHOCARDIOGRAM (TEE);  Surgeon: Ivin Poot, MD;  Location: [facility];  Service: Open Heart Surgery;  Laterality: N/A;        Home Medications    Prior to Admission medications   Medication Sig Start Date End Date Taking? Authorizing Provider  aspirin 81 MG chewable tablet Chew 1 tablet (81 mg total) by mouth daily. 10/25/15  Yes Jacolyn Reedy, MD  atorvastatin (LIPITOR) 40 MG tablet Take 40 mg by mouth daily.   Yes [provider]  DULoxetine (CYMBALTA) 60 MG capsule Take 1 capsule by mouth daily. 04/12/18  Yes [provider]  glimepiride (AMARYL) 4 MG tablet Take 4 mg by mouth 2 (two) times daily.   Yes [provider]  Insulin Glargine (BASAGLAR KWIKPEN) 100 UNIT/ML SOPN Inject 12 Units into the skin daily.  Yes [provider]  metoprolol succinate (TOPROL XL) 25 MG 24 hr tablet Take 1 tablet (25 mg total) by mouth daily. 06/21/18  Yes Weaver, Scott T, [name]  Multiple Vitamin (MULTIVITAMIN) tablet Take 1 tablet by mouth daily.   Yes [provider]  nitroGLYCERIN (NITROSTAT) 0.4 MG SL tablet Place 0.4 mg under the tongue every 5 (five) minutes as needed for chest pain.   Yes [provider]  ondansetron (ZOFRAN-ODT) 4 MG disintegrating tablet Take 4 mg by mouth every 8 (eight) hours as needed for nausea or vomiting. Pt states he takes at home when he feels nauseous   Yes [provider]  pioglitazone (ACTOS) 15 MG tablet Take 15 mg by mouth daily.   Yes [provider]  sildenafil (REVATIO) 20 MG tablet Take 20 mg by mouth daily as needed (ED).   Yes [provider]  sitaGLIPtin (JANUVIA) 50 MG tablet Take 50 mg by mouth daily.   Yes [provider]    Family History Family History  Problem Relation Age of Onset  . Heart attack Mother   . Heart attack Father   . Diabetes Brother   . Pancreatic cancer Brother   . Diabetes Brother   . Colon cancer Neg Hx   . Esophageal cancer Neg Hx   . Prostate cancer Neg Hx   . Rectal cancer Neg Hx   . Stomach cancer Neg Hx     Social History Social History   Tobacco Use  . Smoking status: Never Smoker  . Smokeless tobacco: Never Used  Substance Use Topics  . Alcohol use: No    Comment: 1 beer/wine every 2-3 months per patient  . Drug use: No     Allergies   Actos [pioglitazone]; Levaquin [levofloxacin]; and Lisinopril   Review of Systems Review of Systems  Constitutional:       Per HPI, otherwise negative  HENT:       Per HPI, otherwise negative  Respiratory:       Per HPI, otherwise negative  Cardiovascular:       Per HPI, otherwise negative  Gastrointestinal: Negative for vomiting.  Endocrine:       Negative aside from HPI  Genitourinary:       Neg aside from HPI   Musculoskeletal:       Per HPI, otherwise negative  Skin: Negative.   Neurological: Negative for syncope.     Physical Exam Updated Vital Signs BP (!) 158/81   Pulse (!) 52   Resp 16   SpO2 97%   Physical Exam  Constitutional: He is oriented to person, place, and time.  Ill-appearing elderly male  HENT:  Head: Normocephalic and atraumatic.  Eyes: Conjunctivae and EOM are normal.    Cardiovascular: Normal rate and regular rhythm.  Pulmonary/Chest: Effort normal. No stridor. No respiratory distress.  Abdominal: He exhibits no distension. There is tenderness in the right lower quadrant, suprapubic area and left lower quadrant. There is guarding.  Musculoskeletal: He exhibits no edema.  Neurological: He is alert and oriented to person, place, and time.  Skin: Skin is warm and dry.  Psychiatric: He has a normal mood and affect.  Nursing note and vitals reviewed.    ED Treatments / Results  Labs (all labs ordered are listed, but only abnormal results are displayed) Labs Reviewed  LIPASE, BLOOD - Abnormal; Notable for the following components:      Result Value   Lipase 122 (*)  All other components within normal limits  COMPREHENSIVE METABOLIC PANEL - Abnormal; Notable for the following components:   Glucose, Bld 168 (*)    BUN 37 (*)    Creatinine, Ser 2.32 (*)    Total Protein 6.4 (*)    Total Bilirubin 1.3 (*)    GFR calc non Af Amer 26 (*)    GFR calc Af Amer 30 (*)    All other components within normal limits  URINALYSIS, ROUTINE W REFLEX MICROSCOPIC - Abnormal; Notable for the following components:   APPearance CLOUDY (*)    Hgb urine dipstick LARGE (*)    Protein, ur 100 (*)    RBC / HPF >50 (*)    WBC, UA >50 (*)    Bacteria, UA RARE (*)    All other components within normal limits  CBC    EKG EKG Interpretation  Date/Time:  Saturday August 07 2018 19:40:12 EST Ventricular Rate:  60 PR Interval:    QRS Duration: 126 QT Interval:  439 QTC Calculation: 439 R Axis:   41 Text Interpretation:  Sinus rhythm Artifact Atrial premature complexes Right bundle branch block Abnormal ekg Confirmed by Carmin Muskrat [phone]) on 08/07/2018 7:43:52 PM   Radiology Ct Abdomen Pelvis Wo Contrast  Result Date: 08/07/2018 CLINICAL DATA:  Lower abdominal pain beginning this afternoon. EXAM: CT ABDOMEN AND PELVIS WITHOUT CONTRAST TECHNIQUE:  Multidetector CT imaging of the abdomen and pelvis was performed following the standard protocol without IV contrast. COMPARISON:  08/04/2011 FINDINGS: Lower chest: Lung bases are clear. Calcification in the aortic valve. Hepatobiliary: No focal liver abnormality is seen. No gallstones, gallbladder wall thickening, or biliary dilatation. Pancreas: Unremarkable. No pancreatic ductal dilatation or surrounding inflammatory changes. Spleen: Normal in size without focal abnormality. Adrenals/Urinary Tract: No adrenal gland nodules. Stone in the midpole right kidney measuring 3 mm diameter. Right renal parenchymal atrophy. Pelvic left kidney. Multiple stones in the left kidney, largest measuring 9 mm diameter. Mild left hydronephrosis. Stone in the mid/distal left ureter measuring 3.5 mm. Stone in the distal left ureter measuring 9 mm in diameter. Bladder is unremarkable. Stomach/Bowel: Stomach, small bowel, and colon are mostly decompressed. No wall thickening or inflammatory changes are appreciated. Appendix is not identified. Vascular/Lymphatic: Aortic atherosclerosis. No enlarged abdominal or pelvic lymph nodes. Reproductive: Prostate is unremarkable. Other: No abdominal wall hernia or abnormality. No abdominopelvic ascites. Musculoskeletal: Degenerative disc disease of the lumbosacral interspace. No destructive bone lesions. IMPRESSION: Pelvic left kidney. 3.5 mm stone in the mid/distal left ureter and 9 mm stone in the distal left ureter with moderate proximal obstruction. Bilateral nonobstructing intrarenal stones. Right renal atrophy. Aortic Atherosclerosis (ICD10-I70.0). Electronically Signed   By: Lucienne Capers M.D.   On: 08/07/2018 21:45    Procedures Procedures (including critical care time)  Medications Ordered in ED Medications  fentaNYL (SUBLIMAZE) injection 25 mcg (25 mcg Intravenous Given 08/07/18 2057)  0.9 %  sodium chloride infusion (has no administration in time range)  sodium chloride  0.9 % bolus 500 mL (500 mLs Intravenous New Bag/Given 08/07/18 2240)  fentaNYL (SUBLIMAZE) injection 25 mcg (25 mcg Intravenous Given 08/07/18 2226)     Initial Impression / Assessment and Plan / ED Course  I have reviewed the triage vital signs and the nursing notes.  Pertinent labs & imaging results that were available during my care of the patient were reviewed by me and considered in my medical decision making (see chart for details).     10:41 PM Pain now 8/10  11:16 PM Patient in no distress, awake, alert. Eczema hemodynamically unremarkable. He has received fluid resuscitation, pain is improved No evidence for obstruction, complete, nor for infection. Patient does have numeric abnormality of his lipase, but no upper abdominal pain, and CT does not show acute pancreatitis. Patient also found to have worsening chronic kidney disease, but continues to produce urine.  He has a history of kidney stones, alliance with urologists. With his improved pain, ability to tolerate fluids orally, absence of distress, and after lengthy conversation with him and his wife about pain control, return precautions, follow-up instructions, the patient was discharged in stable condition.  Final Clinical Impressions(s) / ED Diagnoses  Nephrolithiasis   Carmin Muskrat, MD 08/07/18 2316    Carmin Muskrat, MD 08/07/18 [phone]

## 2018-08-08 NOTE — ED Notes (Signed)
Waste of Fentanyl occurred after patient discharged. Wasted 1.5 ml of medication witnessed by Sherrie Sport, RN

## 2018-08-09 ENCOUNTER — Other Ambulatory Visit: Payer: Medicare Other

## 2018-08-09 ENCOUNTER — Other Ambulatory Visit: Payer: Self-pay

## 2018-08-09 ENCOUNTER — Encounter (HOSPITAL_BASED_OUTPATIENT_CLINIC_OR_DEPARTMENT_OTHER): Payer: Self-pay | Admitting: *Deleted

## 2018-08-09 NOTE — H&P (Signed)
Office Visit Report     08/09/2018   --------------------------------------------------------------------------------   Fernando Hess  MRN: [id number]  PRIMARY CARE:  Myriam Jacobson, MD  DOB: [birth year], 72 year old Male  REFERRING:  Ammie Dalton, NP  SSN: -[id number]  PROVIDER:  Festus Aloe, M.D.    TREATING:  Ellison Hughs, M.D.    LOCATION:  [facility] [phone]   --------------------------------------------------------------------------------   CC/HPI: CC: Abdominal pain   HPI: [name] is a 72 year old male with a history of a left pelvic kidney and kidney stones. He also has a history of A-fib (currently on Aspirin).   Doil presents today with c/o pain that started acutely on 08/07/18. He reports intermittent LLQ pain without radiation or modifying factors. He denies N/V/F/C. He reports hematuria since Saturday --currently on 81 mg Aspirin   Labs 08/07/18  Serum creatinine- 2.3  WBC- 7.4  Urine C&S pending. UA showed blood and few bacteria   CT Abd/Pel (08/07/18)  IMPRESSION:  Pelvic left kidney. 3.5 mm stone in the mid/distal left ureter and 2mm stone in the distal left ureter with moderate proximal obstruction. Bilateral nonobstructing intrarenal stones. Right renal atrophy.     ALLERGIES: Levaquin TABS Lisinopril TABS Pioglitazone HCl TABS    MEDICATIONS: Aspir 81 81 mg tablet, delayed release Oral  Atorvastatin Calcium 40 mg tablet Oral  Basaglar Kwikpen U-100 100 unit/ml (3 ml) insulin pen  Duloxetine Hcl 30 mg capsule,delayed release  Glimepiride 4 mg tablet 0 Oral  Hydrocodone-Acetaminophen 5 mg-325 mg tablet  Januvia 50 mg tablet  Pioglitazone Hcl 15 mg tablet  Sildenafil Citrate 20 mg tablet  Sodium Phosphate  Zofran 4 mg tablet     GU PSH: None     PSH Notes: Appendectomy   NON-GU PSH: Appendectomy - 2008 CABG (coronary artery bypass grafting) - 2017    GU PMH: BPH w/LUTS - 09/17/2017, Benign prostatic hyperplasia  with urinary obstruction, - 2016 ED due to arterial insufficiency - 09/17/2017, Erectile dysfunction due to arterial insufficiency, - 2015 Nocturia (Stable) - 09/17/2017, Nocturia, - 2016 Renal calculus - 09/17/2017, Nephrolithiasis, - 2016 Non-gonococcal urethritis - 08/27/2017 Renal cyst, Renal cyst, acquired - 2016 Epididymitis, Epididymitis - 2014 Testicular pain, unspecified, Testicular pain - 2014      PMH Notes:  2007-10-27 15:24:54 - Note: Plantar Calcaneal Spur  Coronary artery disease  Renal disease  Hyperarathyroidism   NON-GU PMH: Encounter for general adult medical examination without abnormal findings, Encounter for preventive health examination - 2014 Personal history of other diseases of the circulatory system, History of hypertension - 2014 Personal history of other diseases of the digestive system, History of esophageal reflux - 2014 Personal history of other endocrine, nutritional and metabolic disease, History of hypercholesterolemia - 2014, History of diabetes mellitus, - 2014 Atrial Fibrillation Depression Diabetes Type 2 Hypertension Myocardial Infarction    FAMILY HISTORY: Cancer - Father, Mother Family Health Status Number - Runs In Family Urologic Disorder - Brother   SOCIAL HISTORY: Marital Status: Married Preferred Language: English; Race: White Current Smoking Status: Patient has never smoked.  Has never drank.  Drinks 1 caffeinated drink per day. Patient's occupation is/was Retired.    REVIEW OF SYSTEMS:    GU Review Male:   Patient denies frequent urination, hard to postpone urination, burning/ pain with urination, get up at night to urinate, leakage of urine, stream starts and stops, trouble starting your stream, have to strain to urinate , erection problems, and penile  pain.  Gastrointestinal (Upper):   Patient denies nausea, vomiting, and indigestion/ heartburn.  Gastrointestinal (Lower):   Patient denies diarrhea and constipation.   Constitutional:   Patient denies fever, night sweats, weight loss, and fatigue.  Skin:   Patient denies skin rash/ lesion and itching.  Eyes:   Patient denies blurred vision and double vision.  Ears/ Nose/ Throat:   Patient denies sore throat and sinus problems.  Hematologic/Lymphatic:   Patient denies swollen glands and easy bruising.  Cardiovascular:   Patient denies leg swelling and chest pains.  Respiratory:   Patient denies cough and shortness of breath.  Endocrine:   Patient denies excessive thirst.  Musculoskeletal:   Patient denies back pain and joint pain.  Neurological:   Patient reports headaches. Patient denies dizziness.  Psychologic:   Patient denies depression and anxiety.   Notes: PT in ER on Saturday, B kidney stones , Pain in abdomen (0-2)     VITAL SIGNS:      08/09/2018 09:25 AM  Weight 173 lb / 78.47 kg  Height 66 in / 167.64 cm  BP 89/56 mmHg  Heart Rate 71 /min  Temperature 98.1 F / 36.7 C  BMI 27.9 kg/m   MULTI-SYSTEM PHYSICAL EXAMINATION:    Constitutional: Well-nourished. No physical deformities. Normally developed. Good grooming.  Neck: Neck symmetrical, not swollen. Normal tracheal position.  Respiratory: No labored breathing, no use of accessory muscles.   Cardiovascular: Normal temperature, normal extremity pulses, no swelling, no varicosities.  Lymphatic: No enlargement of neck, axillae, groin.  Skin: No paleness, no jaundice, no cyanosis. No lesion, no ulcer, no rash.  Neurologic / Psychiatric: Oriented to time, oriented to place, oriented to person. No depression, no anxiety, no agitation.  Gastrointestinal: No mass, no tenderness, no rigidity, non obese abdomen.  Eyes: Normal conjunctivae. Normal eyelids.  Ears, Nose, Mouth, and Throat: Left ear no scars, no lesions, no masses. Right ear no scars, no lesions, no masses. Nose no scars, no lesions, no masses. Normal hearing. Normal lips.  Musculoskeletal: Normal gait and station of head and neck.      PAST DATA REVIEWED:  Source Of History:  Patient   01/13/18 09/17/17 08/05/13 08/05/12 09/17/10 01/22/10 12/07/08 10/29/07  PSA  Total PSA 2.99 ng/mL 3.95 ng/mL 2.13  2.14  1.89  2.65  1.62  1.48     PROCEDURES:          Urinalysis w/Scope Dipstick Dipstick Cont'd Micro  Color: Yellow Bilirubin: Neg mg/dL WBC/hpf: 6 - 10/hpf  Appearance: Clear Ketones: Neg mg/dL RBC/hpf: 3 - 10/hpf  Specific Gravity: 1.010 Blood: 3+ ery/uL Bacteria: Rare (0-9/hpf)  pH: <=5.0 Protein: 1+ mg/dL Cystals: NS (Not Seen)  Glucose: Neg mg/dL Urobilinogen: 0.2 mg/dL Casts: NS (Not Seen)    Nitrites: Neg Trichomonas: Not Present    Leukocyte Esterase: Neg leu/uL Mucous: Not Present      Epithelial Cells: 0 - 5/hpf      Yeast: NS (Not Seen)      Sperm: Not Present    ASSESSMENT:      ICD-10 Details  1 GU:   Renal and ureteral calculus - X21.1   2   Renal colic - H41    PLAN:            Medications New Meds: Keflex 500 mg capsule 1 capsule PO BID   #10  0 Refill(s)  Tamsulosin Hcl 0.4 mg capsule 1 capsule PO Daily   #30  0 Refill(s)  Norco 5 mg-325 mg  tablet 1 tablet PO Q 4 H   #20  0 Refill(s)            Orders Labs Urine Culture          Schedule Return Visit/Planned Activity: ASAP - Schedule Surgery          Document Letter(s):  Created for Patient: Clinical Summary         Notes:   The risks, benefits and alternatives of cystoscopy with LEFT ureteroscopy, laser lithotripsy and ureteral stent placement was discussed the patient. Risks included, but are not limited to: bleeding, urinary tract infection, ureteral injury/avulsion, ureteral stricture formation, retained stone fragments, the possibility that multiple surgeries may be required to treat the stone(s), MI, stroke, PE and the inherent risks of general anesthesia. The patient voices understanding and wishes to proceed.         Next Appointment:      Next Appointment: 08/10/2018 10:30 AM    Appointment Type: Surgery      Location: [facility] [phone]    Provider: Festus Aloe, M.D.    Reason for Visit: NE/OP CYSTO, LT URS, HLL, STENT      * Signed by Ellison Hughs, M.D. on 08/09/18 at 3:11 PM (EST)*     The information contained in this medical record document is considered private and confidential patient information. This information can only be used for the medical diagnosis and/or medical services that are being provided by the patient's selected caregivers. This information can only be distributed outside of the patient's care if the patient agrees and signs waivers of authorization for this information to be sent to an outside source or route.  Addendum - he was rx'd Cephalexin.  Urine culture pending.  I discussed with Dr. Lovena Neighbours.

## 2018-08-09 NOTE — Progress Notes (Signed)
Spoke w/ pt via phone for pre-op interview.  Npo after mn.  Arrive at [facility].  Needs istat.  Current ekg in chart and epic.  Will take toprol, cymbalta, and lipitor am dos w/ sips of water and if needed take oxycodone/zofran.

## 2018-08-10 ENCOUNTER — Ambulatory Visit (HOSPITAL_BASED_OUTPATIENT_CLINIC_OR_DEPARTMENT_OTHER)
Admission: RE | Admit: 2018-08-10 | Discharge: 2018-08-10 | Disposition: A | Discharge status: Home / Self Care | Payer: Medicare Other | Source: Ambulatory Visit | Attending: Urology | Admitting: Urology

## 2018-08-10 ENCOUNTER — Ambulatory Visit (HOSPITAL_BASED_OUTPATIENT_CLINIC_OR_DEPARTMENT_OTHER): Payer: Medicare Other | Admitting: Anesthesiology

## 2018-08-10 ENCOUNTER — Encounter (HOSPITAL_BASED_OUTPATIENT_CLINIC_OR_DEPARTMENT_OTHER)
Admission: RE | Disposition: A | Discharge status: Home / Self Care | Payer: Self-pay | Source: Ambulatory Visit | Attending: Urology

## 2018-08-10 ENCOUNTER — Encounter (HOSPITAL_BASED_OUTPATIENT_CLINIC_OR_DEPARTMENT_OTHER): Payer: Self-pay | Admitting: *Deleted

## 2018-08-10 DIAGNOSIS — Z888 Allergy status to other drugs, medicaments and biological substances status: Secondary | ICD-10-CM | POA: Insufficient documentation

## 2018-08-10 DIAGNOSIS — Z7982 Long term (current) use of aspirin: Secondary | ICD-10-CM | POA: Insufficient documentation

## 2018-08-10 DIAGNOSIS — I252 Old myocardial infarction: Secondary | ICD-10-CM | POA: Diagnosis not present

## 2018-08-10 DIAGNOSIS — Z881 Allergy status to other antibiotic agents status: Secondary | ICD-10-CM | POA: Diagnosis not present

## 2018-08-10 DIAGNOSIS — N201 Calculus of ureter: Secondary | ICD-10-CM

## 2018-08-10 DIAGNOSIS — Z794 Long term (current) use of insulin: Secondary | ICD-10-CM | POA: Insufficient documentation

## 2018-08-10 DIAGNOSIS — F329 Major depressive disorder, single episode, unspecified: Secondary | ICD-10-CM | POA: Diagnosis not present

## 2018-08-10 DIAGNOSIS — Z79899 Other long term (current) drug therapy: Secondary | ICD-10-CM | POA: Diagnosis not present

## 2018-08-10 DIAGNOSIS — N202 Calculus of kidney with calculus of ureter: Secondary | ICD-10-CM | POA: Insufficient documentation

## 2018-08-10 DIAGNOSIS — E119 Type 2 diabetes mellitus without complications: Secondary | ICD-10-CM | POA: Insufficient documentation

## 2018-08-10 DIAGNOSIS — I4891 Unspecified atrial fibrillation: Secondary | ICD-10-CM | POA: Insufficient documentation

## 2018-08-10 DIAGNOSIS — Z87442 Personal history of urinary calculi: Secondary | ICD-10-CM | POA: Diagnosis not present

## 2018-08-10 DIAGNOSIS — N261 Atrophy of kidney (terminal): Secondary | ICD-10-CM | POA: Diagnosis not present

## 2018-08-10 DIAGNOSIS — I10 Essential (primary) hypertension: Secondary | ICD-10-CM | POA: Insufficient documentation

## 2018-08-10 DIAGNOSIS — E7800 Pure hypercholesterolemia, unspecified: Secondary | ICD-10-CM | POA: Insufficient documentation

## 2018-08-10 HISTORY — DX: Calculus of kidney: N20.0

## 2018-08-10 HISTORY — DX: Occlusion and stenosis of bilateral carotid arteries: I65.23

## 2018-08-10 HISTORY — DX: Chronic kidney disease, stage 3 (moderate): N18.3

## 2018-08-10 HISTORY — DX: Calculus of ureter: N20.1

## 2018-08-10 HISTORY — PX: CYSTOSCOPY/URETEROSCOPY/HOLMIUM LASER/STENT PLACEMENT: SHX6546

## 2018-08-10 HISTORY — DX: Personal history of urinary calculi: Z87.442

## 2018-08-10 HISTORY — DX: Personal history of other diseases of the circulatory system: Z86.79

## 2018-08-10 HISTORY — DX: Old myocardial infarction: I25.2

## 2018-08-10 HISTORY — DX: Type 2 diabetes mellitus without complications: E11.9

## 2018-08-10 HISTORY — DX: Atrophy of kidney (terminal): N26.1

## 2018-08-10 HISTORY — DX: Presence of spectacles and contact lenses: Z97.3

## 2018-08-10 HISTORY — DX: Unspecified right bundle-branch block: I45.10

## 2018-08-10 HISTORY — DX: Type 2 diabetes mellitus without complications: Z79.4

## 2018-08-10 HISTORY — DX: Chronic kidney disease, stage 3 unspecified: N18.30

## 2018-08-10 HISTORY — DX: Atrial premature depolarization: I49.1

## 2018-08-10 LAB — GLUCOSE, CAPILLARY: Glucose-Capillary: 185 mg/dL — ABNORMAL HIGH (ref 70–99)

## 2018-08-10 LAB — POCT I-STAT 4, (NA,K, GLUC, HGB,HCT)
Glucose, Bld: 201 mg/dL — ABNORMAL HIGH (ref 70–99)
HCT: 45 % (ref 39.0–52.0)
HEMOGLOBIN: 15.3 g/dL (ref 13.0–17.0)
POTASSIUM: 4.5 mmol/L (ref 3.5–5.1)
Sodium: 140 mmol/L (ref 135–145)

## 2018-08-10 SURGERY — CYSTOSCOPY/URETEROSCOPY/HOLMIUM LASER/STENT PLACEMENT
Anesthesia: General | Laterality: Left

## 2018-08-10 MED ORDER — EPHEDRINE SULFATE 50 MG/ML IJ SOLN
INTRAMUSCULAR | Status: DC | PRN
  Administered 2018-08-10: 15 mg via INTRAVENOUS
  Administered 2018-08-10: 10 mg via INTRAVENOUS
  Administered 2018-08-10: 15 mg via INTRAVENOUS
  Administered 2018-08-10: 10 mg via INTRAVENOUS

## 2018-08-10 MED ORDER — MIDAZOLAM HCL 2 MG/2ML IJ SOLN
INTRAMUSCULAR | Status: DC | PRN
  Administered 2018-08-10: 2 mg via INTRAVENOUS

## 2018-08-10 MED ORDER — HYDROMORPHONE HCL 1 MG/ML IJ SOLN
0.2500 mg | INTRAMUSCULAR | Status: DC | PRN
  Filled 2018-08-10: qty 0.5

## 2018-08-10 MED ORDER — PROPOFOL 10 MG/ML IV BOLUS
INTRAVENOUS | Status: AC
  Filled 2018-08-10: qty 40

## 2018-08-10 MED ORDER — PROPOFOL 10 MG/ML IV BOLUS
INTRAVENOUS | Status: DC | PRN
  Administered 2018-08-10: 150 mg via INTRAVENOUS

## 2018-08-10 MED ORDER — SODIUM CHLORIDE 0.9 % IV SOLN
INTRAVENOUS | Status: DC
  Administered 2018-08-10 (×2): via INTRAVENOUS
  Filled 2018-08-10: qty 1000

## 2018-08-10 MED ORDER — CEFAZOLIN SODIUM-DEXTROSE 2-4 GM/100ML-% IV SOLN
INTRAVENOUS | Status: AC
  Filled 2018-08-10: qty 100

## 2018-08-10 MED ORDER — PROMETHAZINE HCL 25 MG/ML IJ SOLN
6.2500 mg | INTRAMUSCULAR | Status: DC | PRN
  Filled 2018-08-10: qty 1

## 2018-08-10 MED ORDER — OXYCODONE HCL 5 MG/5ML PO SOLN
5.0000 mg | Freq: Once | ORAL | Status: DC | PRN
  Filled 2018-08-10: qty 5

## 2018-08-10 MED ORDER — ONDANSETRON HCL 4 MG/2ML IJ SOLN
INTRAMUSCULAR | Status: DC | PRN
  Administered 2018-08-10: 4 mg via INTRAVENOUS

## 2018-08-10 MED ORDER — DEXAMETHASONE SODIUM PHOSPHATE 10 MG/ML IJ SOLN
INTRAMUSCULAR | Status: AC
  Filled 2018-08-10: qty 1

## 2018-08-10 MED ORDER — LIDOCAINE 2% (20 MG/ML) 5 ML SYRINGE
INTRAMUSCULAR | Status: DC | PRN
  Administered 2018-08-10: 60 mg via INTRAVENOUS

## 2018-08-10 MED ORDER — DEXAMETHASONE SODIUM PHOSPHATE 10 MG/ML IJ SOLN
INTRAMUSCULAR | Status: DC | PRN
  Administered 2018-08-10: 5 mg via INTRAVENOUS

## 2018-08-10 MED ORDER — PHENYLEPHRINE HCL 10 MG/ML IJ SOLN
INTRAMUSCULAR | Status: DC | PRN
  Administered 2018-08-10 (×3): 80 ug via INTRAVENOUS

## 2018-08-10 MED ORDER — FENTANYL CITRATE (PF) 100 MCG/2ML IJ SOLN
INTRAMUSCULAR | Status: DC | PRN
  Administered 2018-08-10 (×4): 25 ug via INTRAVENOUS

## 2018-08-10 MED ORDER — FENTANYL CITRATE (PF) 100 MCG/2ML IJ SOLN
INTRAMUSCULAR | Status: AC
  Filled 2018-08-10: qty 2

## 2018-08-10 MED ORDER — OXYCODONE HCL 5 MG PO TABS
5.0000 mg | ORAL_TABLET | Freq: Once | ORAL | Status: DC | PRN
  Filled 2018-08-10: qty 1

## 2018-08-10 MED ORDER — ONDANSETRON HCL 4 MG/2ML IJ SOLN
INTRAMUSCULAR | Status: AC
  Filled 2018-08-10: qty 2

## 2018-08-10 MED ORDER — LIDOCAINE 2% (20 MG/ML) 5 ML SYRINGE
INTRAMUSCULAR | Status: AC
  Filled 2018-08-10: qty 5

## 2018-08-10 MED ORDER — KETOROLAC TROMETHAMINE 30 MG/ML IJ SOLN
INTRAMUSCULAR | Status: DC | PRN
  Administered 2018-08-10: 30 mg via INTRAVENOUS

## 2018-08-10 MED ORDER — MIDAZOLAM HCL 2 MG/2ML IJ SOLN
INTRAMUSCULAR | Status: AC
  Filled 2018-08-10: qty 2

## 2018-08-10 MED ORDER — CEFAZOLIN SODIUM-DEXTROSE 2-4 GM/100ML-% IV SOLN
2.0000 g | INTRAVENOUS | Status: AC
  Administered 2018-08-10: 2 g via INTRAVENOUS
  Filled 2018-08-10: qty 100

## 2018-08-10 SURGICAL SUPPLY — 35 items
BAG DRAIN URO-CYSTO SKYTR STRL (DRAIN) ×3 IMPLANT
BAG DRN UROCATH (DRAIN) ×1
BAG URINE DRAINAGE (UROLOGICAL SUPPLIES) ×2 IMPLANT
BASKET LASER NITINOL 1.9FR (BASKET) IMPLANT
BASKET ZERO TIP NITINOL 2.4FR (BASKET) ×2 IMPLANT
BSKT STON RTRVL 120 1.9FR (BASKET)
BSKT STON RTRVL ZERO TP 2.4FR (BASKET) ×1
CATH FOLEY 2WAY SLVR  5CC 18FR (CATHETERS) ×2
CATH FOLEY 2WAY SLVR 5CC 18FR (CATHETERS) IMPLANT
CATH URET 5FR 28IN CONE TIP (BALLOONS)
CATH URET 5FR 28IN OPEN ENDED (CATHETERS) ×2 IMPLANT
CATH URET 5FR 70CM CONE TIP (BALLOONS) IMPLANT
CATH URET DUAL LUMEN 6-10FR 50 (CATHETERS) IMPLANT
CLOTH BEACON ORANGE TIMEOUT ST (SAFETY) ×3 IMPLANT
FIBER LASER TRAC TIP (UROLOGICAL SUPPLIES) ×2 IMPLANT
GLOVE BIO SURGEON STRL SZ 6.5 (GLOVE) ×1 IMPLANT
GLOVE BIO SURGEON STRL SZ7 (GLOVE) ×2 IMPLANT
GLOVE BIO SURGEON STRL SZ7.5 (GLOVE) ×3 IMPLANT
GLOVE BIO SURGEONS STRL SZ 6.5 (GLOVE) ×1
GLOVE BIOGEL PI IND STRL 7.0 (GLOVE) IMPLANT
GLOVE BIOGEL PI INDICATOR 7.0 (GLOVE) ×2
GOWN STRL REUS W/TWL LRG LVL3 (GOWN DISPOSABLE) ×5 IMPLANT
GOWN STRL REUS W/TWL XL LVL3 (GOWN DISPOSABLE) ×2 IMPLANT
GUIDEWIRE ANG ZIPWIRE 038X150 (WIRE) ×2 IMPLANT
GUIDEWIRE STR DUAL SENSOR (WIRE) ×3 IMPLANT
GUIDEWIRE ZIPWRE .038 STRAIGHT (WIRE) ×2 IMPLANT
IV NS IRRIG 3000ML ARTHROMATIC (IV SOLUTION) ×8 IMPLANT
KIT TURNOVER CYSTO (KITS) ×3 IMPLANT
MANIFOLD NEPTUNE II (INSTRUMENTS) ×2 IMPLANT
NS IRRIG 500ML POUR BTL (IV SOLUTION) ×3 IMPLANT
PACK CYSTO (CUSTOM PROCEDURE TRAY) ×3 IMPLANT
STENT URET 6FRX22 CONTOUR (STENTS) ×2 IMPLANT
TUBE CONNECTING 12'X1/4 (SUCTIONS) ×1
TUBE CONNECTING 12X1/4 (SUCTIONS) ×1 IMPLANT
TUBING UROLOGY SET (TUBING) ×2 IMPLANT

## 2018-08-10 NOTE — Anesthesia Preprocedure Evaluation (Addendum)
Anesthesia Evaluation  Patient identified by MRN, date of birth, ID band Patient awake    Reviewed: Allergy & Precautions, NPO status , Patient's Chart, lab work & pertinent test results, reviewed documented beta blocker date and time   Airway Mallampati: I  TM Distance: >3 FB Neck ROM: Full    Dental no notable dental hx. (+) Dental Advisory Given, Teeth Intact   Pulmonary neg pulmonary ROS,    Pulmonary exam normal breath sounds clear to auscultation       Cardiovascular hypertension, Pt. on medications and Pt. on home beta blockers + CAD and + Past MI  negative cardio ROS Normal cardiovascular exam Rhythm:Regular Rate:Normal     Neuro/Psych negative neurological ROS  negative psych ROS   GI/Hepatic negative GI ROS, Neg liver ROS,   Endo/Other  negative endocrine ROSdiabetes, Type 2, Oral Hypoglycemic Agents, Insulin Dependent  Renal/GU Renal disease     Musculoskeletal negative musculoskeletal ROS (+)   Abdominal   Peds  Hematology negative hematology ROS (+)   Anesthesia Other Findings Day of surgery medications reviewed with the patient.  Reproductive/Obstetrics                            Anesthesia Physical  Anesthesia Plan  ASA: III  Anesthesia Plan: General   Post-op Pain Management:    Induction: Intravenous  PONV Risk Score and Plan: 2 and Ondansetron, Midazolam and Treatment may vary due to age or medical condition  Airway Management Planned: LMA  Additional Equipment: None  Intra-op Plan:   Post-operative Plan: Extubation in OR  Informed Consent: I have reviewed the patients History and Physical, chart, labs and discussed the procedure including the risks, benefits and alternatives for the proposed anesthesia with the patient or authorized representative who has indicated his/her understanding and acceptance.   Dental advisory given  Plan Discussed with: CRNA  and Surgeon  Anesthesia Plan Comments:        Anesthesia Quick Evaluation

## 2018-08-10 NOTE — Discharge Instructions (Signed)
NO ADVIL, ALEVE, MOTRIN, IBUPROFEN UNTIL 8 PM   Ureteral Stent Implantation, Care After Refer to this sheet in the next few weeks. These instructions provide you with information about caring for yourself after your procedure. Your health care provider may also give you more specific instructions. Your treatment has been planned according to current medical practices, but problems sometimes occur. Call your health care provider if you have any problems or questions after your procedure. What can I expect after the procedure? After the procedure, it is common to have:  Nausea.  Mild pain when you urinate. You may feel this pain in your lower back or lower abdomen. Pain should stop within a few minutes after you urinate. This may last for up to 1 week.  A small amount of blood in your urine for several days.  Follow these instructions at home:  Medicines  Take over-the-counter and prescription medicines only as told by your health care provider.  If you were prescribed an antibiotic medicine, take it as told by your health care provider. Do not stop taking the antibiotic even if you start to feel better.  Do not drive for 24 hours if you received a sedative.  Do not drive or operate heavy machinery while taking prescription pain medicines. Activity  Return to your normal activities as told by your health care provider. Ask your health care provider what activities are safe for you.  Do not lift anything that is heavier than 10 lb (4.5 kg). Follow this limit for 1 week after your procedure, or for as long as told by your health care provider. General instructions  Watch for any blood in your urine. Call your health care provider if the amount of blood in your urine increases.  If you have a catheter: ? Follow instructions from your health care provider about taking care of your catheter and collection bag. ? Do not take baths, swim, or use a hot tub until your health care  provider approves.  Drink enough fluid to keep your urine clear or pale yellow.  Keep all follow-up visits as told by your health care provider. This is important. Contact a health care provider if:  You have pain that gets worse or does not get better with medicine, especially pain when you urinate.  You have difficulty urinating.  You feel nauseous or you vomit repeatedly during a period of more than 2 days after the procedure. Get help right away if:  Your urine is dark red or has blood clots in it.  You are leaking urine (have incontinence).  The end of the stent comes out of your urethra.  You cannot urinate.  You have sudden, sharp, or severe pain in your abdomen or lower back.  You have a fever. This information is not intended to replace advice given to you by your health care provider. Make sure you discuss any questions you have with your health care provider. Document Released: 05/04/2013 Document Revised: 02/07/2016 Document Reviewed: 03/16/2015 Elsevier Interactive Patient Education  2018 [place] Anesthesia Home Care Instructions  Activity: Get plenty of rest for the remainder of the day. A responsible adult should stay with you for 24 hours following the procedure.  For the next 24 hours, DO NOT: -Drive a car -Paediatric nurse -Drink alcoholic beverages -Take any medication unless instructed by your physician -Make any legal decisions or sign important papers.  Meals: Start with liquid foods such as gelatin or soup. Progress  to regular foods as tolerated. Avoid greasy, spicy, heavy foods. If nausea and/or vomiting occur, drink only clear liquids until the nausea and/or vomiting subsides. Call your physician if vomiting continues.  Special Instructions/Symptoms: Your throat may feel dry or sore from the anesthesia or the breathing tube placed in your throat during surgery. If this causes discomfort, gargle with warm salt water. The discomfort  should disappear within 24 hours.  If you had a scopolamine patch placed behind your ear for the management of post- operative nausea and/or vomiting:  1. The medication in the patch is effective for 72 hours, after which it should be removed.  Wrap patch in a tissue and discard in the trash. Wash hands thoroughly with soap and water. 2. You may remove the patch earlier than 72 hours if you experience unpleasant side effects which may include dry mouth, dizziness or visual disturbances. 3. Avoid touching the patch. Wash your hands with soap and water after contact with the patch.

## 2018-08-10 NOTE — Anesthesia Procedure Notes (Addendum)
Procedure Name: LMA Insertion Date/Time: 08/10/2018 12:05 PM Performed by: Wanita Chamberlain, CRNA Pre-anesthesia Checklist: Patient identified, Timeout performed, Emergency Drugs available, Suction available and Patient being monitored Patient Re-evaluated:Patient Re-evaluated prior to induction Oxygen Delivery Method: Circle system utilized Preoxygenation: Pre-oxygenation with 100% oxygen Induction Type: IV induction Ventilation: Mask ventilation without difficulty LMA: LMA inserted LMA Size: 4.0 Number of attempts: 1 Placement Confirmation: CO2 detector,  positive ETCO2 and breath sounds checked- equal and bilateral Tube secured with: Tape Dental Injury: Teeth and Oropharynx as per pre-operative assessment

## 2018-08-10 NOTE — Transfer of Care (Signed)
Immediate Anesthesia Transfer of Care Note  Patient: Fernando Hess  Procedure(s) Performed: CYSTOSCOPY/URETEROSCOPY/HOLMIUM LASER/STENT PLACEMENT (Left )  Patient Location: PACU  Anesthesia Type:General  Level of Consciousness: awake, alert , oriented and patient cooperative  Airway & Oxygen Therapy: Patient Spontanous Breathing and Patient connected to nasal cannula oxygen  Post-op Assessment: Report given to RN and Post -op Vital signs reviewed and stable  Post vital signs: Reviewed and stable  Last Vitals:  Vitals Value Taken Time  BP    Temp    Pulse    Resp    SpO2      Last Pain:  Vitals:   08/10/18 0841  TempSrc: Oral         Complications: No apparent anesthesia complications

## 2018-08-10 NOTE — Interval H&P Note (Signed)
History and Physical Interval Note:  08/10/2018 11:55 AM  Fernando Hess  has presented today for surgery, with the diagnosis of LEFT URETERAL CALCULUS  The various methods of treatment have been discussed with the patient and family. After consideration of risks, benefits and other options for treatment, the patient has consented to  Procedure(s): CYSTOSCOPY/URETEROSCOPY/HOLMIUM LASER/STENT PLACEMENT (Left) as a surgical intervention .  The patient's history has been reviewed, patient examined, no change in status, stable for surgery.  I have reviewed the patient's chart and labs. On abx. No fever. No dysuria. CRNA Shirlean Mylar and I checked pulse - he has an extra beat. Doesn't seen to be brady. No dizziness or CP. Questions were answered to the patient's satisfaction. Discussed he may need a staged procedure.  Also discussed may not be able to access kidney given tortuous ureter.    Festus Aloe

## 2018-08-10 NOTE — Op Note (Signed)
Preoperative diagnosis: Left pelvic kidney, left renal stones, left ureteral stones Postoperative diagnosis: Same  Procedure: Cystoscopy left retrograde pyelogram, left ureteroscopy, stone basket extraction and left ureteral stent placement  Surgeon: Junious Silk  Resident Surgeon: Carlean Purl  Anesthesia: General  Indication for procedure: 72 year old male with symptomatic left distal ureteral stone.  Findings: On cystoscopy the urethra was unremarkable, the prostatic urethra had a high bladder neck, ureteral orifice ease were tucked under the bladder neck, bladder without foreign body or stone.  No mucosal abnormalities.  Left retrograde pyelogram-this outlined a severely J-hook left ureter with an immediate large filling defect consistent with the stone and then contrast traveling up and medial all the way across the sacrum to the right side into the collecting system.  On left ureteroscopy the stone was noted in the left distal ureter and the smaller stone above it.  I was able to get just about to the UPJ but with some much ureteral manipulation and time I thought it was best to perform a staged procedure.  Description of procedure: After consent was obtained patient brought the operating room.  After adequate anesthesia was placed in lithotomy position and prepped and draped in usual fashion.  A timeout was performed to confirm the patient and procedure.  The cystoscope was passed per urethra and the bladder inspected.  I tried to advance a sensor wire but it would not engage the curve in the J-hook of the left ureter.  Therefore I used a angled Glidewire and this went across the J to the stone and passed it but the wire would never straighten and tension on the wire would pull the wire out.  It took multiple attempts to finally get enough purchase in the ureter with pressure on the wire with the 6 [place] open-ended catheter that the ureter finally sprung straight and I was able to advance the  Glidewire into the collecting system.  The wire came all the way across the midline and up into the upper pole collecting system.  The bladder was drained and the scope removed.  Because of having to look over the tall bladder neck and down to the left ureteral orifice the mucosa was bleeding at the left prostate. I had Dr. Carlean Purl scrub in to hold the Glidewire as it wanted to pull itself out as only about a third of the wire was up the collecting system given the inferior kidney.  I passed the semirigid scope and even with a wire and I did take a sensor wire to guide the scope and through the J-hook.  Finally to the stone I lasered and dusted about a third of it.  I then turned it over to Dr. Carlean Purl and she dusted the remainder of the stone and the small stone above it.  She pulled a few of the fragments out into the bladder but it was an easy to get back through the J-hook into the distal ureter.  Therefore she dusted some of the remaining fragments and turn the scope back over to me.  I finished off all the stones but also sequentially dropped several fragments into the bladder.  I figured out that if I went to the left side of the wire I could just get in the ureteral orifice then if I switched to the right side of the wire it would spring the J-hook straight and I could get access.  I dusted the rest of the stones and believe I got the scope up to the right  UPJ which again was pulling across the midline.  Given the time and manipulation of the ureter thought it best to leave a stent and try to come back with an access sheath and a flexible scope and clear out the kidney if the patient wanted to do that.  Therefore the Glidewire was backloaded on the cystoscope.  Carefully a 6 x 22 cm stent was advanced and the wire removed.  A good coil was seen in the right infundibulum or renal pelvis and a good coil in the bladder.  The stent appeared to be draining well.  Some of the fragments were drained from the  bladder.  The bladder was filled and the mucosal bleeding from the prostate was settling down.  The scope was removed and I passed an 48 [place] Foley for wake up.  The patient was awakened and taken to the recovery room in stable condition.  Complications: None  Blood loss: Minimal  Specimens: None  Drains: 6 x 22 cm left ureteral stent  Disposition: Patient stable to PACU- I spoke with the patient's wife and she felt fairly certain he did want to get the kidney cleared out.  I will post him for left ureteroscopy in 2 to 3 weeks unless he decides otherwise at which time we can simply remove the stent in the office.

## 2018-08-13 NOTE — Anesthesia Postprocedure Evaluation (Signed)
Anesthesia Post Note  Patient: MARTE CELANI  Procedure(s) Performed: CYSTOSCOPY/URETEROSCOPY/HOLMIUM LASER/STENT PLACEMENT (Left )     Patient location during evaluation: PACU Anesthesia Type: General Level of consciousness: awake and alert Pain management: pain level controlled Vital Signs Assessment: post-procedure vital signs reviewed and stable Respiratory status: spontaneous breathing, nonlabored ventilation and respiratory function stable Cardiovascular status: blood pressure returned to baseline and stable Postop Assessment: no apparent nausea or vomiting Anesthetic complications: no    Last Vitals:  Vitals:   08/10/18 1445 08/10/18 1620  BP: (!) 144/73 (!) 148/76  Pulse: 71 63  Resp: 15 16  Temp:  36.7 C  SpO2: 95% 100%    Last Pain:  Vitals:   08/11/18 1022  TempSrc:   PainSc: 3    Pain Goal:                 Lynda Rainwater

## 2018-08-16 ENCOUNTER — Encounter (HOSPITAL_BASED_OUTPATIENT_CLINIC_OR_DEPARTMENT_OTHER): Payer: Self-pay | Admitting: Urology

## 2018-08-19 ENCOUNTER — Other Ambulatory Visit: Payer: Self-pay | Admitting: Urology

## 2018-09-01 NOTE — Patient Instructions (Addendum)
Fernando Hess  09/01/2018   Your procedure is scheduled on: 09-10-18   Report to [facility] Main  Entrance    Report to admitting at 12:00PM    Call this number if you have problems the morning of surgery [phone]     Remember: Do not eat food After Midnight. YOU MAY HAVE CLEAR LIQUIDS FROM MIDNIGHT UNTIL 8:00AM. NOTHING BY MOUTH AFTER 8:00AM!  BRUSH YOUR TEETH MORNING OF SURGERY AND RINSE YOUR MOUTH OUT, NO CHEWING GUM CANDY OR MINTS.      CLEAR LIQUID DIET   Foods Allowed                                                                     Foods Excluded  Coffee and tea, regular and decaf                             liquids that you cannot  Plain Jell-O in any flavor                                             see through such as: Fruit ices (not with fruit pulp)                                     milk, soups, orange juice  Iced Popsicles                                    All solid food Carbonated beverages, regular and diet                                    Cranberry, grape and apple juices Sports drinks like Gatorade Lightly seasoned clear broth or consume(fat free) Sugar, honey syrup  Sample Menu Breakfast                                Lunch                                     Supper Cranberry juice                    Beef broth                            Chicken broth Jell-O                                     Grape juice  Apple juice Coffee or tea                        Jell-O                                      Popsicle                                                Coffee or tea                        Coffee or tea  _____________________________________________________________________       Take these medicines the morning of surgery with A SIP OF WATER: metoprolol, atorvastatin, duloxetine    DIABETIC MEDICATIONS : ONLY TAKE HALF DOSE OF INSULIN GLARGINE THE MORNING OF SURGERY  DO NOT TAKE ANY ORAL  DIABETES MEDICATIONS THE MORNING OF SURGERY                                 You may not have any metal on your body including hair pins and              piercings  Do not wear jewelry, make-up, lotions, powders or perfumes, deodorant                          Men may shave face and neck.   Do not bring valuables to the hospital. [place].  Contacts, dentures or bridgework may not be worn into surgery.      Patients discharged the day of surgery will not be allowed to drive home.  Name and phone number of your driver:  Special Instructions: N/A              Please read over the following fact sheets you were given: _____________________________________________________________________             [facility] - Preparing for Surgery Before surgery, you can play an important role.  Because skin is not sterile, your skin needs to be as free of germs as possible.  You can reduce the number of germs on your skin by washing with CHG (chlorahexidine gluconate) soap before surgery.  CHG is an antiseptic cleaner which kills germs and bonds with the skin to continue killing germs even after washing. Please DO NOT use if you have an allergy to CHG or antibacterial soaps.  If your skin becomes reddened/irritated stop using the CHG and inform your nurse when you arrive at Short Stay. Do not shave (including legs and underarms) for at least 48 hours prior to the first CHG shower.  You may shave your face/neck. Please follow these instructions carefully:  1.  Shower with CHG Soap the night before surgery and the  morning of Surgery.  2.  If you choose to wash your hair, wash your hair first as usual with your  normal  shampoo.  3.  After you shampoo, rinse your hair and body thoroughly to remove the  shampoo.  4.  Use CHG as you would any other liquid soap.  You can apply chg directly  to the skin and wash                        Gently with a scrungie or clean washcloth.  5.  Apply the CHG Soap to your body ONLY FROM THE NECK DOWN.   Do not use on face/ open                           Wound or open sores. Avoid contact with eyes, ears mouth and genitals (private parts).                       Wash face,  Genitals (private parts) with your normal soap.             6.  Wash thoroughly, paying special attention to the area where your surgery  will be performed.  7.  Thoroughly rinse your body with warm water from the neck down.  8.  DO NOT shower/wash with your normal soap after using and rinsing off  the CHG Soap.                9.  Pat yourself dry with a clean towel.            10.  Wear clean pajamas.            11.  Place clean sheets on your bed the night of your first shower and do not  sleep with pets. Day of Surgery : Do not apply any lotions/deodorants the morning of surgery.  Please wear clean clothes to the hospital/surgery center.  FAILURE TO FOLLOW THESE INSTRUCTIONS MAY RESULT IN THE CANCELLATION OF YOUR SURGERY PATIENT SIGNATURE_________________________________  NURSE SIGNATURE__________________________________  ________________________________________________________________________

## 2018-09-01 NOTE — Progress Notes (Signed)
EKG 08-07-18 epic   [facility] , [name] [name] 06-09-18 epic   ECHO 10-23-15 epic

## 2018-09-06 ENCOUNTER — Encounter (HOSPITAL_COMMUNITY)
Admission: RE | Admit: 2018-09-06 | Discharge: 2018-09-06 | Disposition: A | Discharge status: Home / Self Care | Payer: Medicare Other | Source: Ambulatory Visit | Attending: Urology | Admitting: Urology

## 2018-09-06 ENCOUNTER — Other Ambulatory Visit: Payer: Self-pay

## 2018-09-06 ENCOUNTER — Encounter (HOSPITAL_COMMUNITY): Payer: Self-pay

## 2018-09-06 DIAGNOSIS — Z01812 Encounter for preprocedural laboratory examination: Secondary | ICD-10-CM | POA: Insufficient documentation

## 2018-09-06 DIAGNOSIS — N200 Calculus of kidney: Secondary | ICD-10-CM | POA: Diagnosis not present

## 2018-09-06 LAB — CBC
HCT: 43.6 % (ref 39.0–52.0)
Hemoglobin: 13.6 g/dL (ref 13.0–17.0)
MCH: 30.4 pg (ref 26.0–34.0)
MCHC: 31.2 g/dL (ref 30.0–36.0)
MCV: 97.5 fL (ref 80.0–100.0)
NRBC: 0 % (ref 0.0–0.2)
Platelets: 174 10*3/uL (ref 150–400)
RBC: 4.47 MIL/uL (ref 4.22–5.81)
RDW: 14.6 % (ref 11.5–15.5)
WBC: 8.3 10*3/uL (ref 4.0–10.5)

## 2018-09-06 LAB — GLUCOSE, CAPILLARY: Glucose-Capillary: 209 mg/dL — ABNORMAL HIGH (ref 70–99)

## 2018-09-06 LAB — BASIC METABOLIC PANEL
Anion gap: 8 (ref 5–15)
BUN: 32 mg/dL — ABNORMAL HIGH (ref 8–23)
CO2: 25 mmol/L (ref 22–32)
Calcium: 9.2 mg/dL (ref 8.9–10.3)
Chloride: 106 mmol/L (ref 98–111)
Creatinine, Ser: 1.82 mg/dL — ABNORMAL HIGH (ref 0.61–1.24)
GFR calc non Af Amer: 36 mL/min — ABNORMAL LOW (ref >60)
GFR, EST AFRICAN AMERICAN: 41 mL/min — AB (ref >60)
Glucose, Bld: 223 mg/dL — ABNORMAL HIGH (ref 70–99)
Potassium: 5.2 mmol/L — ABNORMAL HIGH (ref 3.5–5.1)
Sodium: 139 mmol/L (ref 135–145)

## 2018-09-06 LAB — HEMOGLOBIN A1C
Hgb A1c MFr Bld: 10.8 % — ABNORMAL HIGH (ref 4.8–5.6)
Mean Plasma Glucose: 263.26 mg/dL

## 2018-09-06 NOTE — Progress Notes (Signed)
[email or web address] and bmp routed via epic to Dr Festus Aloe and surgery scheduler [facility]

## 2018-09-10 ENCOUNTER — Ambulatory Visit (HOSPITAL_COMMUNITY)
Admission: RE | Admit: 2018-09-10 | Discharge: 2018-09-10 | Disposition: A | Discharge status: Home / Self Care | Payer: Medicare Other | Attending: Urology | Admitting: Urology

## 2018-09-10 ENCOUNTER — Ambulatory Visit (HOSPITAL_COMMUNITY): Payer: Medicare Other | Admitting: Certified Registered Nurse Anesthetist

## 2018-09-10 ENCOUNTER — Ambulatory Visit (HOSPITAL_COMMUNITY): Payer: Medicare Other

## 2018-09-10 ENCOUNTER — Encounter (HOSPITAL_COMMUNITY)
Admission: RE | Disposition: A | Discharge status: Home / Self Care | Payer: Self-pay | Source: Home / Self Care | Attending: Urology

## 2018-09-10 ENCOUNTER — Encounter (HOSPITAL_COMMUNITY): Payer: Self-pay | Admitting: Certified Registered Nurse Anesthetist

## 2018-09-10 DIAGNOSIS — I129 Hypertensive chronic kidney disease with stage 1 through stage 4 chronic kidney disease, or unspecified chronic kidney disease: Secondary | ICD-10-CM | POA: Diagnosis not present

## 2018-09-10 DIAGNOSIS — N200 Calculus of kidney: Secondary | ICD-10-CM | POA: Diagnosis not present

## 2018-09-10 DIAGNOSIS — I2510 Atherosclerotic heart disease of native coronary artery without angina pectoris: Secondary | ICD-10-CM | POA: Diagnosis not present

## 2018-09-10 DIAGNOSIS — Z7982 Long term (current) use of aspirin: Secondary | ICD-10-CM | POA: Diagnosis not present

## 2018-09-10 DIAGNOSIS — E785 Hyperlipidemia, unspecified: Secondary | ICD-10-CM | POA: Insufficient documentation

## 2018-09-10 DIAGNOSIS — E1122 Type 2 diabetes mellitus with diabetic chronic kidney disease: Secondary | ICD-10-CM | POA: Insufficient documentation

## 2018-09-10 DIAGNOSIS — N183 Chronic kidney disease, stage 3 (moderate): Secondary | ICD-10-CM | POA: Diagnosis not present

## 2018-09-10 DIAGNOSIS — Z794 Long term (current) use of insulin: Secondary | ICD-10-CM | POA: Diagnosis not present

## 2018-09-10 DIAGNOSIS — Z79899 Other long term (current) drug therapy: Secondary | ICD-10-CM | POA: Insufficient documentation

## 2018-09-10 DIAGNOSIS — Z951 Presence of aortocoronary bypass graft: Secondary | ICD-10-CM | POA: Insufficient documentation

## 2018-09-10 DIAGNOSIS — I252 Old myocardial infarction: Secondary | ICD-10-CM | POA: Diagnosis not present

## 2018-09-10 HISTORY — PX: CYSTOSCOPY/URETEROSCOPY/HOLMIUM LASER/STENT PLACEMENT: SHX6546

## 2018-09-10 LAB — GLUCOSE, CAPILLARY
Glucose-Capillary: 129 mg/dL — ABNORMAL HIGH (ref 70–99)
Glucose-Capillary: 99 mg/dL (ref 70–99)

## 2018-09-10 SURGERY — CYSTOSCOPY/URETEROSCOPY/HOLMIUM LASER/STENT PLACEMENT
Anesthesia: General | Site: Ureter | Laterality: Left

## 2018-09-10 MED ORDER — FENTANYL CITRATE (PF) 100 MCG/2ML IJ SOLN
INTRAMUSCULAR | Status: DC | PRN
  Administered 2018-09-10 (×2): 25 ug via INTRAVENOUS
  Administered 2018-09-10 (×2): 50 ug via INTRAVENOUS

## 2018-09-10 MED ORDER — FENTANYL CITRATE (PF) 100 MCG/2ML IJ SOLN
INTRAMUSCULAR | Status: AC
  Filled 2018-09-10: qty 2

## 2018-09-10 MED ORDER — ONDANSETRON HCL 4 MG/2ML IJ SOLN: 4.0000 mg | Freq: Once | INTRAMUSCULAR | Status: DC | PRN

## 2018-09-10 MED ORDER — DEXAMETHASONE SODIUM PHOSPHATE 4 MG/ML IJ SOLN
INTRAMUSCULAR | Status: DC | PRN
  Administered 2018-09-10: 5 mg via INTRAVENOUS

## 2018-09-10 MED ORDER — EPHEDRINE SULFATE-NACL 50-0.9 MG/10ML-% IV SOSY
PREFILLED_SYRINGE | INTRAVENOUS | Status: DC | PRN
  Administered 2018-09-10 (×2): 7.5 mg via INTRAVENOUS
  Administered 2018-09-10: 5 mg via INTRAVENOUS
  Administered 2018-09-10: 7.5 mg via INTRAVENOUS
  Administered 2018-09-10: 10 mg via INTRAVENOUS

## 2018-09-10 MED ORDER — LACTATED RINGERS IV SOLN
INTRAVENOUS | Status: DC
  Administered 2018-09-10: 12:00:00 via INTRAVENOUS

## 2018-09-10 MED ORDER — PROPOFOL 10 MG/ML IV BOLUS
INTRAVENOUS | Status: AC
  Filled 2018-09-10: qty 20

## 2018-09-10 MED ORDER — SODIUM CHLORIDE 0.9 % IR SOLN
Status: DC | PRN
  Administered 2018-09-10: 6000 mL

## 2018-09-10 MED ORDER — PHENYLEPHRINE 40 MCG/ML (10ML) SYRINGE FOR IV PUSH (FOR BLOOD PRESSURE SUPPORT)
PREFILLED_SYRINGE | INTRAVENOUS | Status: DC | PRN
  Administered 2018-09-10 (×3): 80 ug via INTRAVENOUS
  Administered 2018-09-10: 120 ug via INTRAVENOUS

## 2018-09-10 MED ORDER — FENTANYL CITRATE (PF) 250 MCG/5ML IJ SOLN
INTRAMUSCULAR | Status: AC
  Filled 2018-09-10: qty 5

## 2018-09-10 MED ORDER — EPHEDRINE 5 MG/ML INJ
INTRAVENOUS | Status: AC
  Filled 2018-09-10: qty 10

## 2018-09-10 MED ORDER — LIDOCAINE HCL URETHRAL/MUCOSAL 2 % EX GEL
CUTANEOUS | Status: AC
  Filled 2018-09-10: qty 5

## 2018-09-10 MED ORDER — LIDOCAINE 2% (20 MG/ML) 5 ML SYRINGE
INTRAMUSCULAR | Status: AC
  Filled 2018-09-10: qty 5

## 2018-09-10 MED ORDER — BELLADONNA ALKALOIDS-OPIUM 16.2-30 MG RE SUPP
RECTAL | Status: AC
  Filled 2018-09-10: qty 1

## 2018-09-10 MED ORDER — DEXAMETHASONE SODIUM PHOSPHATE 10 MG/ML IJ SOLN
INTRAMUSCULAR | Status: AC
  Filled 2018-09-10: qty 1

## 2018-09-10 MED ORDER — PROPOFOL 10 MG/ML IV BOLUS
INTRAVENOUS | Status: DC | PRN
  Administered 2018-09-10: 10 mg via INTRAVENOUS

## 2018-09-10 MED ORDER — CEFAZOLIN SODIUM-DEXTROSE 2-4 GM/100ML-% IV SOLN
2.0000 g | Freq: Once | INTRAVENOUS | Status: AC
  Administered 2018-09-10: 2 g via INTRAVENOUS
  Filled 2018-09-10: qty 100

## 2018-09-10 MED ORDER — INDIGOTINDISULFONATE SODIUM 8 MG/ML IJ SOLN
INTRAMUSCULAR | Status: AC
  Filled 2018-09-10: qty 5

## 2018-09-10 MED ORDER — ONDANSETRON HCL 4 MG/2ML IJ SOLN
INTRAMUSCULAR | Status: DC | PRN
  Administered 2018-09-10: 4 mg via INTRAVENOUS

## 2018-09-10 MED ORDER — PHENYLEPHRINE 40 MCG/ML (10ML) SYRINGE FOR IV PUSH (FOR BLOOD PRESSURE SUPPORT)
PREFILLED_SYRINGE | INTRAVENOUS | Status: AC
  Filled 2018-09-10: qty 10

## 2018-09-10 MED ORDER — ONDANSETRON HCL 4 MG/2ML IJ SOLN
INTRAMUSCULAR | Status: AC
  Filled 2018-09-10: qty 2

## 2018-09-10 MED ORDER — IOHEXOL 300 MG/ML  SOLN
INTRAMUSCULAR | Status: DC | PRN
  Administered 2018-09-10: 50 mL

## 2018-09-10 MED ORDER — LIDOCAINE 2% (20 MG/ML) 5 ML SYRINGE
INTRAMUSCULAR | Status: DC | PRN
  Administered 2018-09-10: 60 mg via INTRAVENOUS

## 2018-09-10 MED ORDER — FENTANYL CITRATE (PF) 100 MCG/2ML IJ SOLN: 25.0000 ug | INTRAMUSCULAR | Status: DC | PRN

## 2018-09-10 SURGICAL SUPPLY — 20 items
BAG URO CATCHER STRL LF (MISCELLANEOUS) ×3 IMPLANT
BASKET ZERO TIP NITINOL 2.4FR (BASKET) ×3 IMPLANT
BSKT STON RTRVL ZERO TP 2.4FR (BASKET) ×1
CATH INTERMIT  6FR 70CM (CATHETERS) ×3 IMPLANT
CATH URET 5FR 28IN CONE TIP (BALLOONS) ×2
CATH URET 5FR 70CM CONE TIP (BALLOONS) ×1 IMPLANT
CATH URET WHISTLE 6FR (CATHETERS) ×3 IMPLANT
CLOTH BEACON ORANGE TIMEOUT ST (SAFETY) ×3 IMPLANT
COVER WAND RF STERILE (DRAPES) IMPLANT
FIBER LASER TRAC TIP (UROLOGICAL SUPPLIES) ×2 IMPLANT
GLOVE BIO SURGEON STRL SZ7.5 (GLOVE) ×3 IMPLANT
GOWN STRL REUS W/TWL XL LVL3 (GOWN DISPOSABLE) ×3 IMPLANT
GUIDEWIRE STR DUAL SENSOR (WIRE) ×3 IMPLANT
MANIFOLD NEPTUNE II (INSTRUMENTS) ×3 IMPLANT
PACK CYSTO (CUSTOM PROCEDURE TRAY) ×3 IMPLANT
SHEATH URETERAL 12FRX28CM (UROLOGICAL SUPPLIES) ×3 IMPLANT
SHEATH URETERAL 12FRX35CM (MISCELLANEOUS) ×3 IMPLANT
TUBING CONNECTING 10 (TUBING) ×2 IMPLANT
TUBING CONNECTING 10' (TUBING) ×1
WIRE COONS/BENSON .038X145CM (WIRE) ×3 IMPLANT

## 2018-09-10 NOTE — Transfer of Care (Signed)
Immediate Anesthesia Transfer of Care Note  Patient: Fernando Hess  Procedure(s) Performed: CYSTOSCOPY/URETEROSCOPY/HOLMIUM LASER/STENT EXCHANGE (Left Ureter)  Patient Location: PACU  Anesthesia Type:General  Level of Consciousness: awake, alert , oriented and patient cooperative  Airway & Oxygen Therapy: Patient Spontanous Breathing and Patient connected to face mask  Post-op Assessment: Report given to RN and Post -op Vital signs reviewed and stable  Post vital signs: Reviewed and stable  Last Vitals:  Vitals Value Taken Time  BP 146/73 09/10/2018  3:16 PM  Temp    Pulse 78 09/10/2018  3:19 PM  Resp 15 09/10/2018  3:19 PM  SpO2 100 % 09/10/2018  3:19 PM  Vitals shown include unvalidated device data.  Last Pain:  Vitals:   09/10/18 1213  TempSrc:   PainSc: 0-No pain         Complications: No apparent anesthesia complications

## 2018-09-10 NOTE — Anesthesia Preprocedure Evaluation (Signed)
Anesthesia Evaluation  Patient identified by MRN, date of birth, ID band Patient awake    Reviewed: Allergy & Precautions, NPO status , Patient's Chart, lab work & pertinent test results, reviewed documented beta blocker date and time   Airway Mallampati: I  TM Distance: >3 FB Neck ROM: Full    Dental no notable dental hx. (+) Dental Advisory Given, Teeth Intact   Pulmonary neg pulmonary ROS,    Pulmonary exam normal breath sounds clear to auscultation       Cardiovascular hypertension, Pt. on medications and Pt. on home beta blockers + CAD, + Past MI and + CABG  Normal cardiovascular exam Rhythm:Regular Rate:Normal     Neuro/Psych negative neurological ROS  negative psych ROS   GI/Hepatic negative GI ROS, Neg liver ROS,   Endo/Other  negative endocrine ROSdiabetes, Type 2, Oral Hypoglycemic Agents, Insulin Dependent  Renal/GU Renal disease     Musculoskeletal negative musculoskeletal ROS (+)   Abdominal   Peds  Hematology negative hematology ROS (+)   Anesthesia Other Findings   Reproductive/Obstetrics                             Lab Results  Component Value Date   WBC 8.3 09/06/2018   HGB 13.6 09/06/2018   HCT 43.6 09/06/2018   MCV 97.5 09/06/2018   PLT 174 09/06/2018   Lab Results  Component Value Date   CREATININE 1.82 (H) 09/06/2018   BUN 32 (H) 09/06/2018   NA 139 09/06/2018   K 5.2 (H) 09/06/2018   CL 106 09/06/2018   CO2 25 09/06/2018    Anesthesia Physical  Anesthesia Plan  ASA: III  Anesthesia Plan: General   Post-op Pain Management:    Induction: Intravenous  PONV Risk Score and Plan: 2 and Ondansetron, Treatment may vary due to age or medical condition and Dexamethasone  Airway Management Planned: LMA  Additional Equipment: None  Intra-op Plan:   Post-operative Plan: Extubation in OR  Informed Consent: I have reviewed the patients History and  Physical, chart, labs and discussed the procedure including the risks, benefits and alternatives for the proposed anesthesia with the patient or authorized representative who has indicated his/her understanding and acceptance.   Dental advisory given  Plan Discussed with: CRNA and Surgeon  Anesthesia Plan Comments:         Anesthesia Quick Evaluation

## 2018-09-10 NOTE — H&P (Signed)
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  He has a 9 mm left lower pole stone and it was a very difficult ureter access.  We placed a ureteral stent for a staged procedure.  He returns today for follow-up left ureteroscopy, laser lithotripsy and stent exchange.  He has been well.  No fever or dysuria.  Past Medical History:  Diagnosis Date  . Atrial premature complexes   . CAD (coronary artery disease), native coronary artery cardiologist-- dr Raliegh Ip. nelson   hx NSTEMI 09-24-2015  s/p  CABG x5 on 10-01-2017;  post op STEMI inferolateral wall,  SVG OM1 and SVG OM2 occluded, distal OM occlusion the calpruit, treated medically  . CKD (chronic kidney disease), stage III ([place])   . History of atrial fibrillation    post op CABG 10-02-2015  . History of kidney stones   . History of non-ST elevation myocardial infarction (NSTEMI) 09/24/2015   s/p  CABG x5  . History of ST elevation myocardial infarction (STEMI) 10/22/2015   inferior wall,  post op CABG 10-02-2015  . Hyperlipidemia   . Hypertension   . Left ureteral stone   . Mild atherosclerosis of both carotid arteries   . Nephrolithiasis    per CT bilateral non-obstructive calculi  . RBBB (right bundle branch block)   . Renal atrophy, right   . ST elevation myocardial infarction (STEMI) of inferior wall ([place]) 10/22/2015  . Type 2 diabetes mellitus treated with insulin ([place])    followed by pcp  . Wears glasses    Past Surgical History:  Procedure Laterality Date  . APPENDECTOMY  1965  . CARDIAC CATHETERIZATION N/A 09/26/2015   Procedure: Left Heart Cath and Coronary Angiography;  Surgeon: Troy Sine, MD;  Location: [place] [facility];  Service: Cardiovascular;  Laterality: N/A;  . CARDIAC CATHETERIZATION N/A 10/22/2015   Procedure: Left Heart Cath and Coronary Angiography;  Surgeon: Sherren Mocha, MD;   Location: [place] [facility];  Service: Cardiovascular;  Laterality: N/A;  . CATARACT EXTRACTION W/ INTRAOCULAR LENS  IMPLANT, BILATERAL  2017  . CORONARY ARTERY BYPASS GRAFT N/A 10/02/2015   Procedure: CORONARY ARTERY BYPASS GRAFTING (CABG) X5 LIMA-LAD; SVG-DIAG; SVG-OM; SVG-PD; SVG-RAMUS TRANSESOPHAGEAL ECHOCARDIOGRAM (TEE) ENDOSCOPIC GREATER SAPHENOUS VEIN HARVEST BILAT LE;  Surgeon: Ivin Poot, MD;  Location: [place];  Service: Open Heart Surgery;  Laterality: N/A;  . CYSTOSCOPY/URETEROSCOPY/HOLMIUM LASER/STENT PLACEMENT Left 08/10/2018   Procedure: CYSTOSCOPY/URETEROSCOPY/HOLMIUM LASER/STENT PLACEMENT;  Surgeon: Festus Aloe, MD;  Location: [facility];  Service: Urology;  Laterality: Left;  . LEFT HEART CATHETERIZATION WITH CORONARY ANGIOGRAM N/A 04/13/2014   Procedure: LEFT HEART CATHETERIZATION WITH CORONARY ANGIOGRAM;  Surgeon: Jacolyn Reedy, MD;  Location: [facility] CATH LAB;  Service: Cardiovascular;  Laterality: N/A;  . LEG SURGERY Right age 34   closed reduction leg fracture  . TEE WITHOUT CARDIOVERSION N/A 10/02/2015   Procedure: TRANSESOPHAGEAL ECHOCARDIOGRAM (TEE);  Surgeon: Ivin Poot, MD;  Location: [place];  Service: Open Heart Surgery;  Laterality: N/A;  . URETEROSCOPY WITH HOLMIUM LASER LITHOTRIPSY Bilateral 2004;  2005  dr Risa Grill  @[facility]     Home Medications:  Medications Prior to Admission  Medication Sig Dispense Refill Last Dose  . aspirin 81 MG chewable tablet Chew 1 tablet (81 mg total) by mouth daily.   Past Month at Unknown time  . atorvastatin (LIPITOR) 40 MG tablet Take 40 mg  by mouth every morning.    09/10/2018 at 0730  . DULoxetine (CYMBALTA) 60 MG capsule Take 60 mg by mouth daily.   4 09/10/2018 at 0730  . glimepiride (AMARYL) 4 MG tablet Take 4 mg by mouth 2 (two) times daily.    Past Week at Unknown time  . Insulin Glargine (BASAGLAR KWIKPEN) 100 UNIT/ML SOPN Inject 16 Units into the skin daily.    09/10/2018 at 0730  . metoprolol  succinate (TOPROL XL) 25 MG 24 hr tablet Take 1 tablet (25 mg total) by mouth daily. 90 tablet 3 09/10/2018 at 0730  . Multiple Vitamin (MULTIVITAMIN) tablet Take 1 tablet by mouth daily.   Past Month at Unknown time  . ondansetron (ZOFRAN-ODT) 4 MG disintegrating tablet Take 4 mg by mouth every 8 (eight) hours as needed for nausea or vomiting.    Past Month at Unknown time  . pioglitazone (ACTOS) 45 MG tablet Take 45 mg by mouth daily.   Past Week at Unknown time  . sitaGLIPtin (JANUVIA) 100 MG tablet Take 100 mg by mouth daily.    Past Week at Unknown time  . nitroGLYCERIN (NITROSTAT) 0.4 MG SL tablet Place 0.4 mg under the tongue every 5 (five) minutes as needed for chest pain.   More than a month at Unknown time  . oxyCODONE-acetaminophen (PERCOCET/ROXICET) 5-325 MG tablet Take 1 tablet by mouth every 6 (six) hours as needed for severe pain. (Patient not taking: Reported on 08/27/2018) 15 tablet 0 Completed Course at Unknown time  . sildenafil (REVATIO) 20 MG tablet Take 20 mg by mouth daily as needed (for ED).    More than a month at Unknown time   Allergies:  Allergies  Allergen Reactions  . Actos [Pioglitazone] Itching and Rash  . Levaquin [Levofloxacin] Itching and Rash  . Lisinopril Itching and Rash    Family History  Problem Relation Age of Onset  . Heart attack Mother   . Heart attack Father   . Diabetes Brother   . Pancreatic cancer Brother   . Diabetes Brother   . Colon cancer Neg Hx   . Esophageal cancer Neg Hx   . Prostate cancer Neg Hx   . Rectal cancer Neg Hx   . Stomach cancer Neg Hx    Social History:  reports that he has never smoked. He has never used smokeless tobacco. He reports previous alcohol use. He reports that he does not use drugs.  ROS: A complete review of systems was performed.  All systems are negative except for pertinent findings as noted. Review of Systems  All other systems reviewed and are negative.    Physical Exam:  Vital signs in last  24 hours: Temp:  [98.7 F (37.1 C)] 98.7 F (37.1 C) (12/27 1146) Pulse Rate:  [110] 110 (12/27 1146) Resp:  [16] 16 (12/27 1146) BP: (107)/(97) 107/97 (12/27 1146) SpO2:  [98 %] 98 % (12/27 1146) Weight:  [86 kg] 86 kg (12/27 1213) General:  Alert and oriented, No acute distress HEENT: Normocephalic, atraumatic Cardiovascular: Regular rate and rhythm Lungs: Regular rate and effort Abdomen: Soft, nontender, nondistended, no abdominal masses Back: No CVA tenderness Extremities: No edema Neurologic: Grossly intact  Laboratory Data:  Results for orders placed or performed during the hospital encounter of 09/10/18 (from the past 24 hour(s))  Glucose, capillary     Status: Abnormal   Collection Time: 09/10/18 11:48 AM  Result Value Ref Range   Glucose-Capillary 129 (H) 70 - 99 mg/dL  No results found for this or any previous visit (from the past 240 hour(s)). Creatinine: Recent Labs    09/06/18 0803  CREATININE 1.82*    Impression/Assessment:  Left renal stones  Plan:  I discussed with the patient the nature, potential benefits, risks and alternatives to cystoscopy, left ureteroscopy, laser lithotripsy and stent exchange (with plan to leave tether), including side effects of the proposed treatment, the likelihood of the patient achieving the goals of the procedure, and any potential problems that might occur during the procedure or recuperation.  I discussed how difficult his ureter is to access and we may not be able to make it into the kidney.  Other options would be a percutaneous approach.  All questions answered.  He elects to proceed.    Festus Aloe 09/10/2018, 12:58 PM

## 2018-09-10 NOTE — Anesthesia Postprocedure Evaluation (Signed)
Anesthesia Post Note  Patient: Fernando Hess  Procedure(s) Performed: CYSTOSCOPY/URETEROSCOPY/HOLMIUM LASER/STENT EXCHANGE (Left Ureter)     Patient location during evaluation: PACU Anesthesia Type: General Level of consciousness: awake and alert Pain management: pain level controlled Vital Signs Assessment: post-procedure vital signs reviewed and stable Respiratory status: spontaneous breathing, nonlabored ventilation, respiratory function stable and patient connected to nasal cannula oxygen Cardiovascular status: blood pressure returned to baseline and stable Postop Assessment: no apparent nausea or vomiting Anesthetic complications: no    Last Vitals:  Vitals:   09/10/18 1545 09/10/18 1600  BP: 131/69 (!) 157/66  Pulse: 77 77  Resp: 13 14  Temp: 36.9 C 37.1 C  SpO2: 94% 98%    Last Pain:  Vitals:   09/10/18 1600  TempSrc: Oral  PainSc: 0-No pain                 Tiajuana Amass

## 2018-09-10 NOTE — Anesthesia Procedure Notes (Signed)
Procedure Name: LMA Insertion Date/Time: 09/10/2018 2:54 PM Performed by: Claudia Desanctis, CRNA Pre-anesthesia Checklist: Emergency Drugs available, Patient identified, Suction available and Patient being monitored Patient Re-evaluated:Patient Re-evaluated prior to induction Oxygen Delivery Method: Circle system utilized Preoxygenation: Pre-oxygenation with 100% oxygen Induction Type: IV induction Ventilation: Mask ventilation without difficulty LMA: LMA inserted LMA Size: 4.0 Number of attempts: 1 Placement Confirmation: positive ETCO2 and breath sounds checked- equal and bilateral Tube secured with: Tape Dental Injury: Teeth and Oropharynx as per pre-operative assessment

## 2018-09-10 NOTE — Discharge Instructions (Signed)
Ureteral Stent Implantation, Care After Refer to this sheet in the next few weeks. These instructions provide you with information about caring for yourself after your procedure. Your health care provider may also give you more specific instructions. Your treatment has been planned according to current medical practices, but problems sometimes occur. Call your health care provider if you have any problems or questions after your procedure.  Remove the stent: remove the stent with slow steady pressure by pulling the string Tuesday morning, Sep 14, 2018   What can I expect after the procedure? After the procedure, it is common to have:  Nausea.  Mild pain when you urinate. You may feel this pain in your lower back or lower abdomen. Pain should stop within a few minutes after you urinate. This may last for up to 1 week.  A small amount of blood in your urine for several days. Follow these instructions at home:  Medicines  Take over-the-counter and prescription medicines only as told by your health care provider.  If you were prescribed an antibiotic medicine, take it as told by your health care provider. Do not stop taking the antibiotic even if you start to feel better.  Do not drive for 24 hours if you received a sedative.  Do not drive or operate heavy machinery while taking prescription pain medicines. Activity  Return to your normal activities as told by your health care provider. Ask your health care provider what activities are safe for you.  Do not lift anything that is heavier than 10 lb (4.5 kg). Follow this limit for 1 week after your procedure, or for as long as told by your health care provider. General instructions  Watch for any blood in your urine. Call your health care provider if the amount of blood in your urine increases.  If you have a catheter: ? Follow instructions from your health care provider about taking care of your catheter and collection bag. ? Do not  take baths, swim, or use a hot tub until your health care provider approves.  Drink enough fluid to keep your urine clear or pale yellow.  Keep all follow-up visits as told by your health care provider. This is important. Contact a health care provider if:  You have pain that gets worse or does not get better with medicine, especially pain when you urinate.  You have difficulty urinating.  You feel nauseous or you vomit repeatedly during a period of more than 2 days after the procedure. Get help right away if:  Your urine is dark red or has blood clots in it.  You are leaking urine (have incontinence).  The end of the stent comes out of your urethra.  You cannot urinate.  You have sudden, sharp, or severe pain in your abdomen or lower back.  You have a fever. This information is not intended to replace advice given to you by your health care provider. Make sure you discuss any questions you have with your health care provider. Document Released: 05/04/2013 Document Revised: 02/07/2016 Document Reviewed: 03/16/2015 Elsevier Interactive Patient Education  2019 [employer].

## 2018-09-10 NOTE — Op Note (Addendum)
  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  Findings: Stone fragment in the left proximal ureter, upper pole and across the midline and up over the sacrum was inspected, lower pole actually easy to get to after the stent even though the scope took about 130 degree turn to the right.  Description of procedure: After consent was obtained the patient brought to the operating room.  After adequate anesthesia he was placed in lithotomy position and prepped and draped in the usual sterile fashion.  A timeout was performed to confirm the patient and procedure.  The cystoscope was passed per urethra and the left ureteral stent grasped and removed through the urethral meatus.  I passed a Glidewire into the kidney and remove the stent.  The Glidewire was a little too slick and was torquing itself out.  I passed a semirigid ureteroscope into the ureter and was able to get much higher now after pre-stenting.  I found a fragment up in the proximal ureter and this was removed with a 0 tip basket.  Repeat inspection noted there to be no other stones in the ureter.  A sensor wire was passed under direct vision and actually curved and passed into the lower pole and remained in better position.  The scope was removed and I used short ureteral access sheath into the proximal ureter.  The digital ureteroscope was advanced and at the UPJ the large stone from the lower pole had dropped.  A 200 m laser fiber was advanced and it was fragmented at 0.2 and 50 and dusted quite well.  One fragment washed into the kidney.  Other fragments followed the scope out through  the sheath.  Inspection the upper pole and lower pole could never find the small fragment I thought washed into the kidney.  I inspected the collecting system again noted this to be normal, the renal pelvis was normal, the ureter was inspected the scope and the sheath were backed out together and the ureter noted to be normal without injury or stone fragment.  The sensor wire was backloaded on the cystoscope and a 6 x 22 cm stent advanced.  The wire was removed with redundant stent in the kidney and a good coil in the bladder.  The bladder was drained and the scope removed.  He was awakened and taken the recovery room in stable condition.  Complications: None  Blood loss: Minimal  Specimens: None  Drains: 6 x 22 cm left ureteral stent with string  Disposition: Patient stable to PACU

## 2018-09-11 ENCOUNTER — Encounter (HOSPITAL_COMMUNITY): Payer: Self-pay | Admitting: Urology

## 2018-10-13 ENCOUNTER — Encounter: Payer: Self-pay | Admitting: Cardiology

## 2018-10-13 ENCOUNTER — Ambulatory Visit (INDEPENDENT_AMBULATORY_CARE_PROVIDER_SITE_OTHER)
Admission: RE | Admit: 2018-10-13 | Discharge: 2018-10-13 | Disposition: A | Discharge status: Home / Self Care | Payer: Medicare Other | Source: Ambulatory Visit | Attending: Cardiology | Admitting: Cardiology

## 2018-10-13 ENCOUNTER — Encounter (INDEPENDENT_AMBULATORY_CARE_PROVIDER_SITE_OTHER): Payer: Self-pay

## 2018-10-13 ENCOUNTER — Ambulatory Visit (INDEPENDENT_AMBULATORY_CARE_PROVIDER_SITE_OTHER): Payer: Medicare Other | Admitting: Cardiology

## 2018-10-13 ENCOUNTER — Encounter: Payer: Self-pay | Admitting: *Deleted

## 2018-10-13 VITALS — BP 122/68 | HR 44 | Ht 66.0 in | Wt 195.0 lb

## 2018-10-13 DIAGNOSIS — I10 Essential (primary) hypertension: Secondary | ICD-10-CM | POA: Diagnosis not present

## 2018-10-13 DIAGNOSIS — R001 Bradycardia, unspecified: Secondary | ICD-10-CM

## 2018-10-13 DIAGNOSIS — I25810 Atherosclerosis of coronary artery bypass graft(s) without angina pectoris: Secondary | ICD-10-CM

## 2018-10-13 DIAGNOSIS — E785 Hyperlipidemia, unspecified: Secondary | ICD-10-CM | POA: Diagnosis not present

## 2018-10-13 DIAGNOSIS — R296 Repeated falls: Secondary | ICD-10-CM

## 2018-10-13 MED ORDER — METOPROLOL SUCCINATE ER 25 MG PO TB24: 12.5000 mg | ORAL_TABLET | Freq: Every day | ORAL | 2 refills | Status: DC

## 2018-10-13 NOTE — Progress Notes (Signed)
Cardiology Office Note:    Date:  10/13/2018   ID:  Fernando Hess, DOB 06/29/1946, MRN [id number]  PCP:  Fernando Cruel, MD  Cardiologist:  Fernando Dawley, MD (patient request)  Referring MD: Fernando Cruel, MD   Chief complaint: Dizziness recurrent falls.  History of Present Illness:    Fernando Hess is a 73 y.o. male with coronary artery disease status post CABG, diabetes, hypertension, hyperlipidemia, chronic kidney disease who is being seen today to establish with Cardiology at the request of Fernando Cruel, MD.  [name] suffered a myocardial infarction in January 2017 and underwent multivessel CABG.  Postoperative course was complicated by atrial fibrillation treated with amiodarone.  He presented back to the hospital in 10/2015 with an inferolateral ST elevation myocardial infarction.  He had significant hyperkalemia and nausea and vomiting.  Amiodarone was stopped secondary to nausea and vomiting.  Cardiac catheterization demonstrated an occluded SVG-OM1 and occluded SVG-OM2.  His distal OM was occluded.  The distal OM occlusion was felt to be the culprit for his myocardial infarction.  He had diabetic appearing vessels and medical therapy was recommended.    He was previously followed by Fernando Hess.  He is now transferring to our office.  He was last seen by Fernando Hess in 10/2017.  He has not had any chest pain, shortness of breath, fatigue, orthopnea, paroxysmal nocturnal dyspnea, leg swelling, syncope. He does complain of headaches.  He had headaches prior to his myocardial infarction but they were much different.  He denies any associated symptoms.  I have asked him to follow up with primary care for his headaches.    10/13/2018 -the patient states that in the last month he has been feeling dizzy, he had 2 episodes of falls, one when walking to our office and fell face down, he does not remember any palpitation chest pain or shortness of breath that are preceding the  situation, he is not sure if he lost consciousness.  He had similar situation at home in his bathroom again he is not aware if he lost consciousness and how long he was down.  He has hit his head.  He has also been experiencing significant daily headaches in the last 2 weeks.  He has noticed worsening dyspnea on exertion.  On multiple occasion he was told that his blood pressure is rather low.   PAD Screen 06/09/2018  Previous PAD dx? No  Previous surgical procedure? No  Pain with walking? No  Feet/toe relief with dangling? No  Painful, non-healing ulcers? No  Extremities discolored? No    Prior CV studies:   The following studies were reviewed today:  Echocardiogram 10/23/2015 EF 55-60, normal wall motion, mildly calcified aortic valve leaflets, mild AI, mild to moderate MR  Cardiac catheterization 10/22/2015 LM ostial 50 LAD mid 90 RI ostial 90 LCx proximal 80; OM1 80; OM2 100 (culprit for MI) RCA mid 50 LIMA-LAD patent SVG-RPDA patent SVG-D1 patent SVG-OM1 100 SVG-OM2 100 Medical therapy  Pre-CABG Dopplers 09/28/2015 Summary: Findings suggest 1-39% internal carotid artery stenosis bilaterally. Vertebral arteries are patent with antegrade flow. Bilateral ABIs are within normal limits.   Past Medical History:  Diagnosis Date  . Atrial premature complexes   . CAD (coronary artery disease), native coronary artery cardiologist-- dr Fernando Hess. Fernando Hess   hx NSTEMI 09-24-2015  s/p  CABG x5 on 10-01-2017;  post op STEMI inferolateral wall,  SVG OM1 and SVG OM2 occluded, distal OM occlusion the calpruit, treated medically  .  CKD (chronic kidney disease), stage III (Fernando Hess)   . History of atrial fibrillation    post op CABG 10-02-2015  . History of kidney stones   . History of non-ST elevation myocardial infarction (NSTEMI) 09/24/2015   s/p  CABG x5  . History of ST elevation myocardial infarction (STEMI) 10/22/2015   inferior wall,  post op CABG 10-02-2015  . Hyperlipidemia   .  Hypertension   . Left ureteral stone   . Mild atherosclerosis of both carotid arteries   . Nephrolithiasis    per CT bilateral non-obstructive calculi  . RBBB (right bundle branch block)   . Renal atrophy, right   . ST elevation myocardial infarction (STEMI) of inferior wall (Fernando Hess) 10/22/2015  . Type 2 diabetes mellitus treated with insulin ([place])    followed by pcp  . Wears glasses     Past Surgical History:  Procedure Laterality Date  . APPENDECTOMY  1965  . CARDIAC CATHETERIZATION N/A 09/26/2015   Procedure: Left Heart Cath and Coronary Angiography;  Surgeon: Troy Sine, MD;  Location: [facility];  Service: Cardiovascular;  Laterality: N/A;  . CARDIAC CATHETERIZATION N/A 10/22/2015   Procedure: Left Heart Cath and Coronary Angiography;  Surgeon: Sherren Mocha, MD;  Location: [facility];  Service: Cardiovascular;  Laterality: N/A;  . CATARACT EXTRACTION W/ INTRAOCULAR LENS  IMPLANT, BILATERAL  2017  . CORONARY ARTERY BYPASS GRAFT N/A 10/02/2015   Procedure: CORONARY ARTERY BYPASS GRAFTING (CABG) X5 LIMA-LAD; SVG-DIAG; SVG-OM; SVG-PD; SVG-RAMUS TRANSESOPHAGEAL ECHOCARDIOGRAM (TEE) ENDOSCOPIC GREATER SAPHENOUS VEIN HARVEST BILAT LE;  Surgeon: Ivin Poot, MD;  Location: [facility];  Service: Open Heart Surgery;  Laterality: N/A;  . CYSTOSCOPY/URETEROSCOPY/HOLMIUM LASER/STENT PLACEMENT Left 08/10/2018   Procedure: CYSTOSCOPY/URETEROSCOPY/HOLMIUM LASER/STENT PLACEMENT;  Surgeon: Festus Aloe, MD;  Location: [facility];  Service: Urology;  Laterality: Left;  . CYSTOSCOPY/URETEROSCOPY/HOLMIUM LASER/STENT PLACEMENT Left 09/10/2018   Procedure: CYSTOSCOPY/URETEROSCOPY/HOLMIUM LASER/STENT EXCHANGE;  Surgeon: Festus Aloe, MD;  Location: [facility];  Service: Urology;  Laterality: Left;  . LEFT HEART CATHETERIZATION WITH CORONARY ANGIOGRAM N/A 04/13/2014   Procedure: LEFT HEART CATHETERIZATION WITH CORONARY ANGIOGRAM;  Surgeon: Jacolyn Reedy, MD;  Location: [facility]  CATH LAB;  Service: Cardiovascular;  Laterality: N/A;  . LEG SURGERY Right age 38   closed reduction leg fracture  . TEE WITHOUT CARDIOVERSION N/A 10/02/2015   Procedure: TRANSESOPHAGEAL ECHOCARDIOGRAM (TEE);  Surgeon: Ivin Poot, MD;  Location: [place];  Service: Open Heart Surgery;  Laterality: N/A;  . URETEROSCOPY WITH HOLMIUM LASER LITHOTRIPSY Bilateral 2004;  2005  dr Risa Grill  @[facility]     Current Medications: Current Meds  Medication Sig  . aspirin 81 MG chewable tablet Chew 1 tablet (81 mg total) by mouth daily.  [name] atorvastatin (LIPITOR) 40 MG tablet Take 40 mg by mouth every morning.   . DULoxetine (CYMBALTA) 60 MG capsule Take 60 mg by mouth daily.   [name] glimepiride (AMARYL) 4 MG tablet Take 4 mg by mouth 2 (two) times daily.   . Insulin Glargine (BASAGLAR KWIKPEN) 100 UNIT/ML SOPN Inject 18 Units into the skin daily.  . Multiple Vitamin (MULTIVITAMIN) tablet Take 1 tablet by mouth daily.  . nitroGLYCERIN (NITROSTAT) 0.4 MG SL tablet Place 0.4 mg under the tongue every 5 (five) minutes as needed for chest pain.  [name] ondansetron (ZOFRAN-ODT) 4 MG disintegrating tablet Take 4 mg by mouth every 8 (eight) hours as needed for nausea or vomiting.   . pioglitazone (ACTOS) 45 MG tablet Take 45 mg by  mouth daily.  . sildenafil (REVATIO) 20 MG tablet Take 20 mg by mouth daily as needed (for ED).   [name] sitaGLIPtin (JANUVIA) 100 MG tablet Take 100 mg by mouth daily.   . [DISCONTINUED] metoprolol succinate (TOPROL XL) 25 MG 24 hr tablet Take 1 tablet (25 mg total) by mouth daily.     Allergies:   Actos [pioglitazone]; Levaquin [levofloxacin]; and Lisinopril   Social History   Socioeconomic History  . Marital status: Married    Spouse name: Not on file  . Number of children: Not on file  . Years of education: Not on file  . Highest education level: Not on file  Occupational History  . Occupation: retired  Scientific laboratory technician  . Financial resource strain: Not on file  . Food insecurity:    Worry:  Not on file    Inability: Not on file  . Transportation needs:    Medical: Not on file    Non-medical: Not on file  Tobacco Use  . Smoking status: Never Smoker  . Smokeless tobacco: Never Used  Substance and Sexual Activity  . Alcohol use: Not Currently  . Drug use: Never  . Sexual activity: Not on file  Lifestyle  . Physical activity:    Days per week: Not on file    Minutes per session: Not on file  . Stress: Not on file  Relationships  . Social connections:    Talks on phone: Not on file    Gets together: Not on file    Attends religious service: Not on file    Active member of club or organization: Not on file    Attends meetings of clubs or organizations: Not on file    Relationship status: Not on file  Other Topics Concern  . 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 There is no history of Colon cancer, Esophageal cancer, Prostate cancer, Rectal cancer, or Stomach cancer.  ROS:   Please see the history of present illness.    Review of Systems  Eyes: Positive for visual disturbance.  Respiratory: Positive for cough.   Neurological: Positive for headaches.   All other systems reviewed and are negative.   EKGs/Labs/Other Test Reviewed:    EKG:  EKG is  ordered today.  The ekg ordered today demonstrates marked sinus bradycardia with premature atrial complexes, incomplete right bundle branch block unchanged from prior.  This was personally reviewed.  Recent Labs: 08/07/2018: ALT 30 09/06/2018: BUN 32; Creatinine, Ser 1.82; Hemoglobin 13.6; Platelets 174; Potassium 5.2; Sodium 139   Recent Lipid Panel Lab Results  Component Value Date/Time   CHOL 81 10/23/2015 05:15 AM   TRIG 102 10/23/2015 05:15 AM   HDL 30 (L) 10/23/2015 05:15 AM    CHOLHDL 2.7 10/23/2015 05:15 AM   LDLCALC 31 10/23/2015 05:15 AM   From [facility] Tool: Cholesterol, total 114.000 03/10/2018 HDL 50.000 03/10/2018 LDL 52.000 03/10/2018 Triglycerides 60.000 03/10/2018 A1C 8.800 03/10/2018 Hemoglobin 14.500 03/10/2018 Creatinine, Serum 2.020 04/09/2018 Potassium 5.100 04/09/2018 ALT (SGPT) 28.000 04/09/2018 TSH 0.468 10/26/2015 Platelets 173.000 12/01/2016  Physical Exam:    VS:  BP 122/68   Pulse (!) 44   Ht 5\' 6"  (1.676 m)   Wt 195 lb (88.5 kg)   SpO2 96%  BMI 31.47 kg/m     Wt Readings from Last 3 Encounters:  10/13/18 195 lb (88.5 kg)  09/10/18 189 lb 9.5 oz (86 kg)  09/06/18 190 lb (86.2 kg)     Physical Exam  Constitutional: He is oriented to person, place, and time. He appears well-developed and well-nourished. No distress.  HENT:  Head: Normocephalic and atraumatic.  Neck: No JVD present. Carotid bruit is not present. No thyromegaly present.  Cardiovascular: Normal rate, regular rhythm and normal heart sounds.  No murmur heard. Pulmonary/Chest: Effort normal and breath sounds normal. He has no rales.  Abdominal: Soft. There is no hepatomegaly.  Musculoskeletal:        General: No edema.  Lymphadenopathy:    He has no cervical adenopathy.  Neurological: He is alert and oriented to person, place, and time.  Skin: Skin is warm and dry.  Psychiatric: He has a normal mood and affect.    ASSESSMENT & PLAN:    Coronary artery disease involving coronary bypass graft of native heart without angina pectoris Hx of CABG in 2017.  He returned to the hospital several weeks later with an inferolateral STEMI.  The vein grafts to both OMs were occluded and the culprit was a distal OM occlusion.  He was treated medically.  He had a follow up ETT with Fernando Hess in 12/2015 that was electrically positive but clinically negative.   -We will obtain exercise nuclear stress test to evaluate for ischemia and chronotropic competence   Recurrent falls -I will  decrease Toprol-XL to 12.5 mg p.o. daily, this was started after episodes of tachycardia in the past.  Some scared to discontinue altogether. -We will obtain head CT as he recently fell and hit his head, this will also help [place] to see if there is any substrate for his headaches if normal head CT, he is advised to follow with neurology.  Essential hypertension  BP controlled on no medications.  Hyperlipidemia, unspecified hyperlipidemia type  LDL optimal on most recent lab work.  Continue current Rx.    Dispo:  No follow-ups on file.   Medication Adjustments/Labs and Tests Ordered: Current medicines are reviewed at length with the patient today.  Concerns regarding medicines are outlined above.  Orders/Tests:  Orders Placed This Encounter  Procedures  . CT Head Wo Contrast  . Myocardial Perfusion Imaging  . EKG 12-Lead   Medication changes: Meds ordered this encounter  Medications  . metoprolol succinate (TOPROL XL) 25 MG 24 hr tablet    Sig: Take 0.5 tablets (12.5 mg total) by mouth daily.    Dispense:  30 tablet    Refill:  2   Signed, Fernando Dawley, MD  10/13/2018 12:43 PM    [address], [place], [place]  [address] Phone: [phone]; Fax: [phone]

## 2018-10-13 NOTE — Patient Instructions (Signed)
Medication Instructions:   DECREASE YOUR METOPROLOL SUCCINATE (TOPROL XL) TO 12.5 MG ONCE DAILY  If you need a refill on your cardiac medications before your next appointment, please call your pharmacy.      Testing/Procedures:  CT HEAD W/O CONTRAST --FOR FREQUENT FALLS   Your physician has requested that you have en exercise stress myoview. For further information please visit [email or web address]. Please follow instruction sheet, as given.    Follow-Up:  4 MONTHS WITH AN EXTENDER ON DR NELSON'S TEAM

## 2018-10-15 ENCOUNTER — Telehealth: Payer: Self-pay | Admitting: *Deleted

## 2018-10-15 NOTE — Telephone Encounter (Signed)
-----   Message from Dorothy Spark, MD sent at 10/15/2018 10:32 AM EST ----- CT head showed no No acute intracranial pathology. He should follow with a neurology

## 2018-10-15 NOTE — Telephone Encounter (Signed)
Pt and his wife called back from the [place] store because multiple cell services are having connection problems. I s/w pt and his wife about the CT results. Pt and his wife asked since CT looks ok can our office call neurology and get a sooner appt than the end of 10/2018 appt with Dr. Coral Ceo. I advised pt they could call neuro and let them know that he is still having these HA and wants to be seen sooner. Pt is concerned that he is not going to be able to do the stress with these HA. Pt asked if we could call the Neuro and get sooner appt. I said I will let Dr. Meda Coffee and her nurse Karlene Einstein aware of call today.

## 2018-10-18 ENCOUNTER — Telehealth: Payer: Self-pay | Admitting: Cardiology

## 2018-10-18 NOTE — Telephone Encounter (Signed)
Spoke with the pt and informed him that he would need to call his Neurologist office to request for his appt to be moved up, from the end of this month, for his head CT was normal, with no acute intracranial pathology noted, per Dr Meda Coffee.  Advised the pt that being he is already established with Neuro, he can call and request the appt move, or be placed on a cancellation list.  Pt verbalized understanding and agrees with this plan.

## 2018-10-18 NOTE — Telephone Encounter (Signed)
Endorsed to the pts wife that the pts CT of his Head showed no acute findings or pathological findings, per Dr Meda Coffee.  Wife states that the Neuro office has placed the pt on a cancellation list, to see if they can move his appt up, from the end of the month.  Wife states that he will be here for testing we ordered for this week. Wife gracious for all the assistance provided.

## 2018-10-18 NOTE — Telephone Encounter (Signed)
New message     Pt spouse stated that she called Neurologist office and was not able to get an appt sooner but was placed on cancellation list. Pt does have additional  question for nurse

## 2018-10-19 ENCOUNTER — Telehealth (HOSPITAL_COMMUNITY): Payer: Self-pay

## 2018-10-19 NOTE — Telephone Encounter (Signed)
Spoke with the patient's wife. Detailed instructions given and patient stated that he understood and would be here for his test. S.Keyshon Stein EMTP

## 2018-10-21 ENCOUNTER — Ambulatory Visit (HOSPITAL_COMMUNITY): Payer: Medicare Other | Attending: Cardiology

## 2018-10-21 DIAGNOSIS — I25810 Atherosclerosis of coronary artery bypass graft(s) without angina pectoris: Secondary | ICD-10-CM | POA: Diagnosis not present

## 2018-10-21 DIAGNOSIS — E785 Hyperlipidemia, unspecified: Secondary | ICD-10-CM | POA: Insufficient documentation

## 2018-10-21 DIAGNOSIS — R001 Bradycardia, unspecified: Secondary | ICD-10-CM

## 2018-10-21 DIAGNOSIS — I10 Essential (primary) hypertension: Secondary | ICD-10-CM | POA: Insufficient documentation

## 2018-10-21 DIAGNOSIS — R296 Repeated falls: Secondary | ICD-10-CM | POA: Insufficient documentation

## 2018-10-21 LAB — MYOCARDIAL PERFUSION IMAGING
LV dias vol: 102 mL (ref 62–150)
LV sys vol: 49 mL
Peak HR: 131 {beats}/min
Rest HR: 78 {beats}/min
SDS: 2
SRS: 0
SSS: 2
TID: 1

## 2018-10-21 MED ORDER — TECHNETIUM TC 99M TETROFOSMIN IV KIT
10.5000 | PACK | Freq: Once | INTRAVENOUS | Status: AC | PRN
  Administered 2018-10-21: 10.5 via INTRAVENOUS
  Filled 2018-10-21: qty 11

## 2018-10-21 MED ORDER — REGADENOSON 0.4 MG/5ML IV SOLN
0.4000 mg | Freq: Once | INTRAVENOUS | Status: AC
  Administered 2018-10-21: 0.4 mg via INTRAVENOUS

## 2018-10-21 MED ORDER — TECHNETIUM TC 99M TETROFOSMIN IV KIT
31.3000 | PACK | Freq: Once | INTRAVENOUS | Status: AC | PRN
  Administered 2018-10-21: 31.3 via INTRAVENOUS
  Filled 2018-10-21: qty 32

## 2018-11-05 ENCOUNTER — Other Ambulatory Visit: Payer: Self-pay | Admitting: Nephrology

## 2018-11-05 DIAGNOSIS — E785 Hyperlipidemia, unspecified: Secondary | ICD-10-CM

## 2018-11-05 DIAGNOSIS — N189 Chronic kidney disease, unspecified: Secondary | ICD-10-CM

## 2018-11-05 DIAGNOSIS — N183 Chronic kidney disease, stage 3 unspecified: Secondary | ICD-10-CM

## 2018-11-05 DIAGNOSIS — Z794 Long term (current) use of insulin: Secondary | ICD-10-CM

## 2018-11-05 DIAGNOSIS — I2510 Atherosclerotic heart disease of native coronary artery without angina pectoris: Secondary | ICD-10-CM

## 2018-11-05 DIAGNOSIS — N2581 Secondary hyperparathyroidism of renal origin: Secondary | ICD-10-CM

## 2018-11-05 DIAGNOSIS — E0822 Diabetes mellitus due to underlying condition with diabetic chronic kidney disease: Secondary | ICD-10-CM

## 2018-11-05 DIAGNOSIS — D631 Anemia in chronic kidney disease: Secondary | ICD-10-CM

## 2018-11-05 DIAGNOSIS — I129 Hypertensive chronic kidney disease with stage 1 through stage 4 chronic kidney disease, or unspecified chronic kidney disease: Secondary | ICD-10-CM

## 2018-11-05 DIAGNOSIS — I4891 Unspecified atrial fibrillation: Secondary | ICD-10-CM

## 2018-11-05 DIAGNOSIS — I2583 Coronary atherosclerosis due to lipid rich plaque: Secondary | ICD-10-CM

## 2018-11-10 ENCOUNTER — Encounter: Payer: Self-pay | Admitting: *Deleted

## 2018-11-11 ENCOUNTER — Ambulatory Visit (INDEPENDENT_AMBULATORY_CARE_PROVIDER_SITE_OTHER): Payer: Medicare Other | Admitting: Neurology

## 2018-11-11 ENCOUNTER — Encounter

## 2018-11-11 ENCOUNTER — Encounter: Payer: Self-pay | Admitting: Neurology

## 2018-11-11 ENCOUNTER — Telehealth: Payer: Self-pay | Admitting: Neurology

## 2018-11-11 VITALS — BP 145/83 | HR 68 | Ht 66.0 in | Wt 201.0 lb

## 2018-11-11 VITALS — BP 137/69 | HR 70 | Ht 66.0 in | Wt 201.6 lb

## 2018-11-11 DIAGNOSIS — G4730 Sleep apnea, unspecified: Secondary | ICD-10-CM

## 2018-11-11 DIAGNOSIS — I25810 Atherosclerosis of coronary artery bypass graft(s) without angina pectoris: Secondary | ICD-10-CM

## 2018-11-11 DIAGNOSIS — R292 Abnormal reflex: Secondary | ICD-10-CM

## 2018-11-11 DIAGNOSIS — Z951 Presence of aortocoronary bypass graft: Secondary | ICD-10-CM | POA: Diagnosis not present

## 2018-11-11 DIAGNOSIS — G4710 Hypersomnia, unspecified: Secondary | ICD-10-CM

## 2018-11-11 DIAGNOSIS — W19XXXA Unspecified fall, initial encounter: Secondary | ICD-10-CM

## 2018-11-11 DIAGNOSIS — G4733 Obstructive sleep apnea (adult) (pediatric): Secondary | ICD-10-CM

## 2018-11-11 DIAGNOSIS — R258 Other abnormal involuntary movements: Secondary | ICD-10-CM

## 2018-11-11 DIAGNOSIS — R270 Ataxia, unspecified: Secondary | ICD-10-CM

## 2018-11-11 DIAGNOSIS — E119 Type 2 diabetes mellitus without complications: Secondary | ICD-10-CM | POA: Diagnosis not present

## 2018-11-11 DIAGNOSIS — N184 Chronic kidney disease, stage 4 (severe): Secondary | ICD-10-CM

## 2018-11-11 DIAGNOSIS — E113393 Type 2 diabetes mellitus with moderate nonproliferative diabetic retinopathy without macular edema, bilateral: Secondary | ICD-10-CM

## 2018-11-11 DIAGNOSIS — M5000 Cervical disc disorder with myelopathy, unspecified cervical region: Secondary | ICD-10-CM

## 2018-11-11 DIAGNOSIS — R0683 Snoring: Secondary | ICD-10-CM

## 2018-11-11 DIAGNOSIS — G3281 Cerebellar ataxia in diseases classified elsewhere: Secondary | ICD-10-CM

## 2018-11-11 DIAGNOSIS — G441 Vascular headache, not elsewhere classified: Secondary | ICD-10-CM

## 2018-11-11 NOTE — Patient Instructions (Addendum)
Sleep evaluation   Sleep Apnea Sleep apnea is a condition in which breathing pauses or becomes shallow during sleep. Episodes of sleep apnea usually last 10 seconds or longer, and they may occur as many as 20 times an hour. Sleep apnea disrupts your sleep and keeps your body from getting the rest that it needs. This condition can increase your risk of certain health problems, including:  Heart attack.  Stroke.  Obesity.  Diabetes.  Heart failure.  Irregular heartbeat. There are three kinds of sleep apnea:  Obstructive sleep apnea. This kind is caused by a blocked or collapsed airway.  Central sleep apnea. This kind happens when the part of the brain that controls breathing does not send the correct signals to the muscles that control breathing.  Mixed sleep apnea. This is a combination of obstructive and central sleep apnea. What are the causes? The most common cause of this condition is a collapsed or blocked airway. An airway can collapse or become blocked if:  Your throat muscles are abnormally relaxed.  Your tongue and tonsils are larger than normal.  You are overweight.  Your airway is smaller than normal. What increases the risk? This condition is more likely to develop in people who:  Are overweight.  Smoke.  Have a smaller than normal airway.  Are elderly.  Are male.  Drink alcohol.  Take sedatives or tranquilizers.  Have a family history of sleep apnea. What are the signs or symptoms? Symptoms of this condition include:  Trouble staying asleep.Waking up to urinate.  Headaches all day  Daytime sleepiness and tiredness.  Irritability.  Loud snoring.  Morning headaches.  Trouble concentrating.  Forgetfulness.  Decreased interest in sex.  Unexplained sleepiness. Naps during the day.   Mood swings.  Personality changes.  Feelings of depression.  Waking up often during the night to urinate.  Dry mouth.  Sore throat. How is this  diagnosed? This condition may be diagnosed with:  A medical history.  A physical exam.  A series of tests that are done while you are sleeping (sleep study). These tests are usually done in a sleep lab, but they may also be done at home. How is this treated? Treatment for this condition aims to restore normal breathing and to ease symptoms during sleep. It may involve managing health issues that can affect breathing, such as high blood pressure or obesity. Treatment may include:  Sleeping on your side.  Using a decongestant if you have nasal congestion.  Avoiding the use of depressants, including alcohol, sedatives, and narcotics.  Losing weight if you are overweight.  Making changes to your diet.  Quitting smoking.  Using a device to open your airway while you sleep, such as: ? An oral appliance. This is a custom-made mouthpiece that shifts your lower jaw forward. ? A continuous positive airway pressure (CPAP) device. This device delivers oxygen to your airway through a mask. ? A nasal expiratory positive airway pressure (EPAP) device. This device has valves that you put into each nostril. ? A bi-level positive airway pressure (BPAP) device. This device delivers oxygen to your airway through a mask.  Surgery if other treatments do not work. During surgery, excess tissue is removed to create a wider airway. It is important to get treatment for sleep apnea. Without treatment, this condition can lead to:  High blood pressure.  Coronary artery disease.  (Men) An inability to achieve or maintain an erection (impotence).  Reduced thinking abilities. Follow these instructions at home:  Make any lifestyle changes that your health care provider recommends.  Eat a healthy, well-balanced diet.  Take over-the-counter and prescription medicines only as told by your health care provider.  Avoid using depressants, including alcohol, sedatives, and narcotics.  Take steps to lose  weight if you are overweight.  If you were given a device to open your airway while you sleep, use it only as told by your health care provider.  Do not use any tobacco products, such as cigarettes, chewing tobacco, and e-cigarettes. If you need help quitting, ask your health care provider.  Keep all follow-up visits as told by your health care provider. This is important. Contact a health care provider if:  The device that you received to open your airway during sleep is uncomfortable or does not seem to be working.  Your symptoms do not improve.  Your symptoms get worse. Get help right away if:  You develop chest pain.  You develop shortness of breath.  You develop discomfort in your back, arms, or stomach.  You have trouble speaking.  You have weakness on one side of your body.  You have drooping in your face. These symptoms may represent a serious problem that is an emergency. Do not wait to see if the symptoms will go away. Get medical help right away. Call your local emergency services (911 in the [place] Do not drive yourself to the hospital. This information is not intended to replace advice given to you by your health care provider. Make sure you discuss any questions you have with your health care provider. Document Released: 08/22/2002 Document Revised: 03/30/2017 Document Reviewed: 06/11/2015 Elsevier Interactive Patient Education  2019 [employer].

## 2018-11-11 NOTE — Progress Notes (Signed)
[facility]    Provider:  Dr Jaynee Eagles Referring Provider: [name], MD Primary Care Provider:  [name], MD  CC:  headaches  HPI:  Fernando Hess is a 73 y.o. male here as requested by provider [name], MD for memory loss, syncope also mentioned in note however he says he is here for Headache. Discussed which he would like to discuss as we cannot do all three in the time allotted and they would like to focus on headaches.  Past medical history diabetes, hypertension, elevated cholesterol, GERD, kidney stone, erectile dysfunction, iron deficiency anemia, broken leg, dehydration, chronic kidney disease. He has headaches on the sides of his head and on the right. Hurts on the top. Wife is here and provides much information. 3 years ago he had surgery CAD and at the time he was having bad headaches and taking chronic tylenol. It improved after heart bypass. The headaches are coming back. Ongoing since last fall. More pressure, not punding or throbbing, no light or sound sensitivity. Can be severe. 7/10. They can last hours. He is taking tylenol every day. He wakes up with them. He has insomnia. He sleeps a lot during the day. He wakes with dry mouth. After breakfast he takes a nap. He naps in the afternoon. He does not feel refreshed in the morning. He is having memory loss. Both sons ave sleep apnea. Wakes frequently in the night to urinate. Dr. Zadie Rhine also suspected sleep apnea.   Reviewed notes, labs and imaging from outside physicians, which showed:  Encounter date states diagnosis memory changes reviewed Dr. Alan Ripper notes.  Patient more forgetful and having trouble with balance falls etc.  But he was sent here for memory changes balance falls.  He had a fall recently hit right side of the head and Sunday fell in bathroom had left arm, and right knee and side, also headache from the first fall, each fall felt dizzy some fogginess.  He apparently fell  again.  On exam patient limping, favoring right leg, complaining of left arm pain.  Sprain of jaw.  Contusion of arm and leg and chest wall.  CT of the head:  Vascular: Cerebrovascular atherosclerotic calcifications are noted.  Skull: Negative for fracture or focal lesion.  Sinuses/Orbits: Visualized portions of the orbits are unremarkable. Visualized portions of the paranasal sinuses and mastoid air cells are unremarkable.  Other: None.  IMPRESSION: No acute intracranial pathology. HgbA1c 11.5  Review of Systems: Patient complains of symptoms per HPI as well as the following symptoms: Weight gain, fevers, easy bruising, easy bleeding, headache, sleepiness, dizziness, cramps, itching, moles, not no sleep, decreased energy, aching muscles, sleepiness. Pertinent negatives and positives per HPI. All others negative.   Social History   Socioeconomic History  . Marital status: Married    Spouse name: Not on file  . Number of children: Not on file  . Years of education: Not on file  . Highest education level: Not on file  Occupational History  . Occupation: retired  Scientific laboratory technician  . Financial resource strain: Not on file  . Food insecurity:    Worry: Not on file    Inability: Not on file  . Transportation needs:    Medical: Not on file    Non-medical: Not on file  Tobacco Use  . Smoking status: Never Smoker  . Smokeless tobacco: Never Used  Substance and Sexual Activity  . Alcohol use: Not Currently  . Drug use: Never  .  Sexual activity: Not on file  Lifestyle  . Physical activity:    Days per week: Not on file    Minutes per session: Not on file  . Stress: Not on file  Relationships  . Social connections:    Talks on phone: Not on file    Gets together: Not on file    Attends religious service: Not on file    Active member of club or organization: Not on file    Attends meetings of clubs or organizations: Not on file    Relationship status: Not on file  .  Intimate partner violence:    Fear of current or ex partner: Not on file    Emotionally abused: Not on file    Physically abused: Not on file    Forced sexual activity: Not on file  Other Topics Concern  . 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 Heart attack Mother   . Heart attack Father   . Diabetes Brother   . Pancreatic cancer Brother   . Diabetes Brother   . Colon cancer Neg Hx   . Esophageal cancer Neg Hx   . Prostate cancer Neg Hx   . Rectal cancer Neg Hx   . Stomach cancer Neg Hx     Past Medical History:  Diagnosis Date  . Atrial premature complexes   . CAD (coronary artery disease), native coronary artery cardiologist-- dr Raliegh Ip. nelson   hx NSTEMI 09-24-2015  s/p  CABG x5 on 10-01-2017;  post op STEMI inferolateral wall,  SVG OM1 and SVG OM2 occluded, distal OM occlusion the calpruit, treated medically  . CKD (chronic kidney disease), stage III ([place])   . Erectile dysfunction   . Esophageal reflux   . History of atrial fibrillation    post op CABG 10-02-2015  . History of kidney stones   . History of non-ST elevation myocardial infarction (NSTEMI) 09/24/2015   s/p  CABG x5  . History of ST elevation myocardial infarction (STEMI) 10/22/2015   inferior wall,  post op CABG 10-02-2015  . Hyperlipidemia   . Hypertension   . Kidney stone    h/o  . Left ureteral stone   . Mild atherosclerosis of both carotid arteries   . Nephrolithiasis    per CT bilateral non-obstructive calculi  . RBBB (right bundle branch block)   . Renal atrophy, right   . ST elevation myocardial infarction (STEMI) of inferior wall ([place]) 10/22/2015  . Type 2 diabetes mellitus treated with insulin ([place])    followed by pcp  . Wears glasses     Patient Active Problem List   Diagnosis Date Noted  . CAD (coronary artery disease) of artery bypass graft   . 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  Elevated troponin   . CAD (coronary artery disease), native coronary artery 09/24/2015  . Acute renal failure (ARF) ([place]) 09/24/2015  . Diabetes mellitus type 2, noninsulin dependent ([place]) 03/01/2008  . Hyperlipidemia, unspecified   . Essential hypertension   . History of kidney stones     Past Surgical History:  Procedure Laterality Date  . APPENDECTOMY  1965  . CARDIAC CATHETERIZATION N/A 09/26/2015   Procedure: Left Heart Cath and Coronary Angiography;  Surgeon: Troy Sine, MD;  Location: [facility];  Service: Cardiovascular;  Laterality: N/A;  .  CARDIAC CATHETERIZATION N/A 10/22/2015   Procedure: Left Heart Cath and Coronary Angiography;  Surgeon: Sherren Mocha, MD;  Location: [facility];  Service: Cardiovascular;  Laterality: N/A;  . CATARACT EXTRACTION W/ INTRAOCULAR LENS  IMPLANT, BILATERAL  2017  . CORONARY ARTERY BYPASS GRAFT N/A 10/02/2015   Procedure: CORONARY ARTERY BYPASS GRAFTING (CABG) X5 LIMA-LAD; SVG-DIAG; SVG-OM; SVG-PD; SVG-RAMUS TRANSESOPHAGEAL ECHOCARDIOGRAM (TEE) ENDOSCOPIC GREATER SAPHENOUS VEIN HARVEST BILAT LE;  Surgeon: Ivin Poot, MD;  Location: [facility];  Service: Open Heart Surgery;  Laterality: N/A;  . CYSTOSCOPY/URETEROSCOPY/HOLMIUM LASER/STENT PLACEMENT Left 08/10/2018   Procedure: CYSTOSCOPY/URETEROSCOPY/HOLMIUM LASER/STENT PLACEMENT;  Surgeon: Festus Aloe, MD;  Location: [facility];  Service: Urology;  Laterality: Left;  . CYSTOSCOPY/URETEROSCOPY/HOLMIUM LASER/STENT PLACEMENT Left 09/10/2018   Procedure: CYSTOSCOPY/URETEROSCOPY/HOLMIUM LASER/STENT EXCHANGE;  Surgeon: Festus Aloe, MD;  Location: [facility];  Service: Urology;  Laterality: Left;  . LEFT HEART CATHETERIZATION WITH CORONARY ANGIOGRAM N/A 04/13/2014   Procedure: LEFT HEART CATHETERIZATION WITH CORONARY ANGIOGRAM;  Surgeon: Jacolyn Reedy, MD;  Location[facility];  Service: Cardiovascular;  Laterality: N/A;  . LEG SURGERY Right age 67   closed  reduction leg fracture  . TEE WITHOUT CARDIOVERSION N/A 10/02/2015   Procedure: TRANSESOPHAGEAL ECHOCARDIOGRAM (TEE);  Surgeon: Ivin Poot, MD;  Location: [facility];  Service: Open Heart Surgery;  Laterality: N/A;  . URETEROSCOPY WITH HOLMIUM LASER LITHOTRIPSY Bilateral 2004;  2005  dr grapey  @[email or web address]   . VASECTOMY      Current Outpatient Medications  Medication Sig Dispense Refill  . aspirin 81 MG chewable tablet Chew 1 tablet (81 mg total) by mouth daily.    [name] atorvastatin (LIPITOR) 40 MG tablet Take 40 mg by mouth every morning.     . DULoxetine (CYMBALTA) 60 MG capsule Take 60 mg by mouth daily.   4  . glimepiride (AMARYL) 4 MG tablet Take 4 mg by mouth 2 (two) times daily.     . Insulin Glargine (BASAGLAR KWIKPEN) 100 UNIT/ML SOPN Inject 18 Units into the skin daily.    . metoprolol succinate (TOPROL XL) 25 MG 24 hr tablet Take 0.5 tablets (12.5 mg total) by mouth daily. 30 tablet 2  . Multiple Vitamin (MULTIVITAMIN) tablet Take 1 tablet by mouth daily.    . nitroGLYCERIN (NITROSTAT) 0.4 MG SL tablet Place 0.4 mg under the tongue every 5 (five) minutes as needed for chest pain.    [name] ondansetron (ZOFRAN-ODT) 4 MG disintegrating tablet Take 4 mg by mouth every 8 (eight) hours as needed for nausea or vomiting.     . pioglitazone (ACTOS) 45 MG tablet Take 45 mg by mouth daily.    . sildenafil (REVATIO) 20 MG tablet Take 20 mg by mouth daily as needed (for ED).     [name] sitaGLIPtin (JANUVIA) 100 MG tablet Take 100 mg by mouth daily.      No current facility-administered medications for this visit.     Allergies as of 11/11/2018 - Review Complete 11/11/2018  Allergen Reaction Noted  . Victoza [liraglutide]  11/10/2018  . Actos [pioglitazone] Itching and Rash 09/24/2015  . Levaquin [levofloxacin] Itching and Rash 12/05/2013  . Lisinopril Itching and Rash 12/05/2013    Vitals: BP 137/69   Pulse 70   Ht 5\' 6"  (1.676 m)   Wt 201 lb 9.6 oz (91.4 kg)   BMI 32.54 kg/m  Last Weight:  Wt  Readings from Last 1 Encounters:  11/11/18 201 lb 9.6 oz (91.4 kg)   Last Height:  Ht Readings from Last 1 Encounters:  11/11/18 5\' 6"  (1.676 m)     Physical exam: Exam: Gen: NAD, conversant, well nourised, obese, well groomed                     CV: RRR, no MRG. No Carotid Bruits. No peripheral edema, warm, nontender Eyes: Conjunctivae clear without exudates or hemorrhage  Neuro: Detailed Neurologic Exam  Speech:    Speech is normal; fluent and spontaneous with normal comprehension.  Cognition:    The patient is oriented to person, place, and time;     recent and remote memory intact;     language fluent;     normal attention, concentration,     fund of knowledge Cranial Nerves:    The pupils are equal, round, and reactive to light. The fundi are flat. Visual fields are full to finger confrontation. Extraocular movements are intact. Trigeminal sensation is intact and the muscles of mastication are normal. The face is symmetric. The palate elevates in the midline. Hearing intact. Voice is normal. Shoulder shrug is normal. The tongue has normal motion without fasciculations.   Coordination:    Normal finger to nose    Gait:    Heel-toe and tandem gait with imbalance  Motor Observation:    No asymmetry, no atrophy, and no involuntary movements noted. Tone:    Normal muscle tone.    Posture:    Posture is normal. normal erect    Strength:    Strength is V/V in the upper and lower limbs.      Sensation: intact to LT     Reflex Exam:  DTR's:    Deep tendon reflexes in the upper and lower extremities are brisk bilaterally.   Toes:    The toes are downgoing bilaterally.   Clonus:    2 beats clonus AJs  negative Hoffmans    Assessment/Plan:  73 year old here for headaches, memory loss. Also multiple falls.  Sleep evaluation: He wakes up with headaches. He sleeps a lot during the day. He wakes with dry mouth. After breakfast he takes a nap. He naps in the  afternoon. He does not feel refreshed in the morning. He is having memory loss. Both sons have sleep apnea. Wakes frequently in the night to urinate. Dr. Zadie Rhine also suspected sleep apnea. Can be etiology of his headaches and his memory changes, fatigue.   Spoke with our sleep team, hate for patient to wait again after waiting for me. Dr. Brett Fairy will see him at 1pm today. Will no charge this visit since seeing Dr. Brett Fairy today.  Recent falls, brisk reflexes, 2 beats clonus Ajs:MRI cervical spine    Orders Placed This Encounter  Procedures  . MR CERVICAL SPINE WO CONTRAST  . Ambulatory referral to Sleep Studies    Cc: [name], MD  Sarina Ill, MD  [facility] [address] [place], [place] [address]  Phone [phone] Fax [phone]

## 2018-11-11 NOTE — Progress Notes (Addendum)
SLEEP MEDICINE CLINIC   Provider:  Larey Seat, M.D.   Primary Care Physician:  [name], MD   Referring Provider: [name], MD   Chief Complaint  Patient presents with  . New Patient (Initial Visit)    pt with wife, rm 59. pt states that he has been struggling with headaches and memory concerns for some time now. in his discussion with Dr Jaynee Eagles earlier it was brought to attention that the patient takes freqient naps during the day. pt wife states that she thinks he has moments where he stops breathing. never had a sleep study and unsure if he snores in sleep.     HPI:  LILY KERNEN is a 73 y.o. male patient, seen here as in a referral from Dr. Jaynee Eagles for sleep apnea evaluation in relation to sleep.   He reports headaches any time of the day, and his ophthalmologist Dr Deloria Lair, MD suggested the PSG test. He suspected hypoxemia contributing to abnormal vascular congestion. Patient has been physically inactive since  Bypass surgery.    Sleep habits are as follows: the couple sleeps in separate rooms, she snores, he doesn't think he does. She says he does.  5 PM is supper time, and bedtime is 10 PM, but he sometimes is in be as early as 7.  The patient usually watches TV during the evening hours.  He describes his bedroom as not entirely cool, but quiet and dark.  He mainly sleeps prone, without any pillows. Sleep duration varies, his sleep is mainly fragmented by nocturia. He estimates that he can go to the bathroom every 2 hours.  The patient reports that he wakes up because he has to go to the bathroom.  He has a dry mouth when he wakes up. He has difficulties going back to sleep.  Sometimes he goes back to the den and watches tv for hours and has a snack.  Racing thoughts.   He rises at 8.30 AM - goes back to bed after breakfast. Same after lunch- naps last 2-3 hours (!).  He sleeps to escape his headaches.  Estimated sleep time up to 10 hours.     Sleep medical history : never had a sleep study. Headaches. CKD grade 4 . 2 surgeries for renal stones 2019. DM 2 - 1999, now insulin for the last 6 month.  diabetic retinopathy, nephropathy, bradycardia.   Family sleep history: 2 sons with OSA.    Social history: married, retired, 2 adult sons. Non smoker, seldomly ETOH,  Caffeine : he drinks coke, 1  Coffee/ day  and chocolate, seldom iced tea - when going out.   Review of Systems: Out of a complete 14 system review, the patient complains of only the following symptoms, and all other reviewed systems are negative.  Hypersomnia, frustrated. 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 Marital status: Married    Spouse name: Not on file  . Number of children: Not on file  . Years of education: Not on file  . Highest education level: Not on file  Occupational History  . Occupation: retired  Scientific laboratory technician  . Financial resource strain: Not on file  . Food insecurity:    Worry: Not on file    Inability: Not on file  . Transportation needs:    Medical: Not on file  Non-medical: Not on file  Tobacco Use  . Smoking status: Never Smoker  . Smokeless tobacco: Never Used  Substance and Sexual Activity  . Alcohol use: Not Currently  . Drug use: Never  . Sexual activity: Not on file  Lifestyle  . Physical activity:    Days per week: Not on file    Minutes per session: Not on file  . Stress: Not on file  Relationships  . Social connections:    Talks on phone: Not on file    Gets together: Not on file    Attends religious service: Not on file    Active member of club or organization: Not on file    Attends meetings of clubs or organizations: Not on file    Relationship status: Not on file  . Intimate partner violence:    Fear of current or ex partner: Not on file    Emotionally abused: Not on  file    Physically abused: Not on file    Forced sexual activity: Not on file  Other Topics Concern  . 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 Heart attack Mother   . Heart attack Father   . Diabetes Brother   . Pancreatic cancer Brother   . Diabetes Brother   . Colon cancer Neg Hx   . Esophageal cancer Neg Hx   . Prostate cancer Neg Hx   . Rectal cancer Neg Hx   . Stomach cancer Neg Hx     Past Medical History:  Diagnosis Date  . Atrial premature complexes   . CAD (coronary artery disease), native coronary artery cardiologist-- dr Raliegh Ip. nelson   hx NSTEMI 09-24-2015  s/p  CABG x5 on 10-01-2017;  post op STEMI inferolateral wall,  SVG OM1 and SVG OM2 occluded, distal OM occlusion the calpruit, treated medically  . CKD (chronic kidney disease), stage III ([place])   . Erectile dysfunction   . Esophageal reflux   . History of atrial fibrillation    post op CABG 10-02-2015  . History of kidney stones   . History of non-ST elevation myocardial infarction (NSTEMI) 09/24/2015   s/p  CABG x5  . History of ST elevation myocardial infarction (STEMI) 10/22/2015   inferior wall,  post op CABG 10-02-2015  . Hyperlipidemia   . Hypertension   . Kidney stone    h/o  . Left ureteral stone   . Mild atherosclerosis of both carotid arteries   . Nephrolithiasis    per CT bilateral non-obstructive calculi  . RBBB (right bundle branch block)   . Renal atrophy, right   . ST elevation myocardial infarction (STEMI) of inferior wall ([place]) 10/22/2015  . Type 2 diabetes mellitus treated with insulin ([place])    followed by pcp  . Wears glasses     Past Surgical History:  Procedure Laterality Date  . APPENDECTOMY  1965  . CARDIAC CATHETERIZATION N/A 09/26/2015   Procedure: Left Heart Cath and Coronary Angiography;  Surgeon: Troy Sine, MD;  Location: [facility];  Service:  Cardiovascular;  Laterality: N/A;  . CARDIAC CATHETERIZATION N/A 10/22/2015   Procedure: Left Heart Cath and Coronary Angiography;  Surgeon: Sherren Mocha, MD;  Location: [facility];  Service: Cardiovascular;  Laterality: N/A;  . CATARACT EXTRACTION W/ INTRAOCULAR LENS  IMPLANT, BILATERAL  2017  .  CORONARY ARTERY BYPASS GRAFT N/A 10/02/2015   Procedure: CORONARY ARTERY BYPASS GRAFTING (CABG) X5 LIMA-LAD; SVG-DIAG; SVG-OM; SVG-PD; SVG-RAMUS TRANSESOPHAGEAL ECHOCARDIOGRAM (TEE) ENDOSCOPIC GREATER SAPHENOUS VEIN HARVEST BILAT LE;  Surgeon: Ivin Poot, MD;  Location: [facility];  Service: Open Heart Surgery;  Laterality: N/A;  . CYSTOSCOPY/URETEROSCOPY/HOLMIUM LASER/STENT PLACEMENT Left 08/10/2018   Procedure: CYSTOSCOPY/URETEROSCOPY/HOLMIUM LASER/STENT PLACEMENT;  Surgeon: Festus Aloe, MD;  Location[facility];  Service: Urology;  Laterality: Left;  . CYSTOSCOPY/URETEROSCOPY/HOLMIUM LASER/STENT PLACEMENT Left 09/10/2018   Procedure: CYSTOSCOPY/URETEROSCOPY/HOLMIUM LASER/STENT EXCHANGE;  Surgeon: Festus Aloe, MD;  Location: [facility];  Service: Urology;  Laterality: Left;  . LEFT HEART CATHETERIZATION WITH CORONARY ANGIOGRAM N/A 04/13/2014   Procedure: LEFT HEART CATHETERIZATION WITH CORONARY ANGIOGRAM;  Surgeon: Jacolyn Reedy, MD;  Location: [facility];  Service: Cardiovascular;  Laterality: N/A;  . LEG SURGERY Right age 39   closed reduction leg fracture  . TEE WITHOUT CARDIOVERSION N/A 10/02/2015   Procedure: TRANSESOPHAGEAL ECHOCARDIOGRAM (TEE);  Surgeon: Ivin Poot, MD;  Location: [facility];  Service: Open Heart Surgery;  Laterality: N/A;  . URETEROSCOPY WITH HOLMIUM LASER LITHOTRIPSY Bilateral 2004;  2005  dr grapey  @[facility]   . VASECTOMY      Current Outpatient Medications  Medication Sig Dispense Refill  . aspirin 81 MG chewable tablet Chew 1 tablet (81 mg total) by mouth daily.    [name] atorvastatin (LIPITOR) 40 MG tablet Take 40 mg by mouth every morning.       . DULoxetine (CYMBALTA) 60 MG capsule Take 60 mg by mouth daily.   4  . glimepiride (AMARYL) 4 MG tablet Take 4 mg by mouth 2 (two) times daily.     . Insulin Glargine (BASAGLAR KWIKPEN) 100 UNIT/ML SOPN Inject 18 Units into the skin daily.    . metoprolol succinate (TOPROL XL) 25 MG 24 hr tablet Take 0.5 tablets (12.5 mg total) by mouth daily. 30 tablet 2  . Multiple Vitamin (MULTIVITAMIN) tablet Take 1 tablet by mouth daily.    . nitroGLYCERIN (NITROSTAT) 0.4 MG SL tablet Place 0.4 mg under the tongue every 5 (five) minutes as needed for chest pain.    [name] ondansetron (ZOFRAN-ODT) 4 MG disintegrating tablet Take 4 mg by mouth every 8 (eight) hours as needed for nausea or vomiting.     . pioglitazone (ACTOS) 45 MG tablet Take 45 mg by mouth daily.    . sildenafil (REVATIO) 20 MG tablet Take 20 mg by mouth daily as needed (for ED).     [name] sitaGLIPtin (JANUVIA) 100 MG tablet Take 100 mg by mouth daily.      No current facility-administered medications for this visit.     Allergies as of 11/11/2018 - Review Complete 11/11/2018  Allergen Reaction Noted  . Victoza [liraglutide]  11/10/2018  . Actos [pioglitazone] Itching and Rash 09/24/2015  . Levaquin [levofloxacin] Itching and Rash 12/05/2013  . Lisinopril Itching and Rash 12/05/2013    Vitals: BP (!) 145/83   Pulse 68   Ht 5\' 6"  (1.676 m)   Wt 201 lb (91.2 kg)   BMI 32.44 kg/m  Last Weight:  Wt Readings from Last 1 Encounters:  11/11/18 201 lb (91.2 kg)   OZY:YQMG mass index is 32.44 kg/m.     Last Height:   Ht Readings from Last 1 Encounters:  11/11/18 5\' 6"  (1.676 m)    Physical exam:  General: The patient is awake, alert and appears not in acute distress. The patient is well groomed. Head: Normocephalic,  atraumatic. Neck is supple. Mallampati: 5- tremulous tongue   neck circumference: 16. 25. Nasal airflow patent ,   Retrognathia is seen.  Cardiovascular:  Regular rate and rhythm- distended neck veins. Respiratory: Lungs  are clear to auscultation. Skin:  Without evidence of edema, or rash Trunk: BMI is 32. 44 . The patient's posture is stooped.   Neurologic exam : The patient is awake and alert, oriented to place and time.   Attention span & concentration ability appears normal.  Speech is fluent,  without dysarthria, dysphonia or aphasia.  Mood and affect are appropriate.  Cranial nerves: Pupils are equal and briskly reactive to light. Extraocular movements  in vertical and horizontal planes intact and without nystagmus. Visual fields by finger perimetry are intact. Hearing to finger rub impaired. Facial sensation intact to fine touch.  Facial motor strength is symmetric and tongue and uvula move midline. Shoulder shrug was symmetrical.   Fundoscopy will be addended with Dr. Radene Ou report.   Motor exam:   Normal tone, muscle bulk and symmetric strength in all extremities. Sensory:  Fine touch, pinprick and vibration were tested in all extremities.  Proprioception tested in the upper extremities was normal. Coordination: Rapid alternating movements in the fingers/hands was normal. Finger-to-nose maneuver  normal without evidence of ataxia, dysmetria but with a low amplitude tremor. The tremor is there with and without action, less at rest. Micrographia.  changes in handwriting were noted  Gait and station: Patient walks without assistive device. Stance is stable and normal. Tandem gait is unfragmented. Turns with 3 Steps. Romberg testing is negative. Deep tendon reflexes: in the  upper and lower extremities are symmetric and intact.    Assessment:  After physical and neurologic examination, review of laboratory studies,  Personal review of imaging studies, reports of other /same  Imaging studies, results of polysomnography and / or neurophysiology testing and pre-existing records as far as provided in visit., my assessment is:    1) Tremor   there are lots of co-morbidities- CKD , tremor, DM and  related neuropathy, retinopathy, and nephropathy. CKD 4 since cardiac cath and bypass surgery.   2)  OSA? Snoring is present and morbidities include Nocturia, poor restorative sleep quality and daytime EDS.   3) Headaches are present night and day and he feels these impair his sleep/ Dr Zadie Rhine apparently found signs of vascular congestion related to OSA.  Patient also reports lightheadedness when he first gets up in the morning and orthostatic hypotension can be seen with autonomic diabetic neuropathy.  Insomnia due to headaches, but also poor sleep hygiene, fragmentation and no routines. Gave him the insomnia boot camp.  Hopefully treatment of his sleep disorder will help his headaches.  His CT was reviewed - he has significant brain atrophy.    The patient was advised of the nature of the diagnosed disorder , the treatment options and the  risks for general health and wellness arising from not treating the condition.   I spent more than 50 minutes of face to face time with the patient.  Greater than 50% of time was spent in counseling and coordination of care. We have discussed the diagnosis and differential and I answered the patient's questions.    Plan:  Treatment plan and additional workup :  I like for the patient to undergo an attended sleep study which would be a split-night protocol.  My goal is to evaluate him for hypoxemia during sleep, associated apnea and to rule out central forms of  apnea.  Mr. counter reports that he does not wake up from his own snoring, he has no feeling of gasping for air or choking at night just a very dry mouth.  It is entirely possible that he has central apnea related to CKD grade 4 or his coronary artery disease and related changes in his ejection fraction.    Larey Seat, MD 8/93/8101, 7:51 PM  Certified in Neurology by ABPN Certified in [place] by [facility] [address] [place], [place]  [address]

## 2018-11-11 NOTE — Telephone Encounter (Signed)
[employer] medicare order sent to GI. No auth they will reach out to the pt to schedule.  °

## 2018-11-15 ENCOUNTER — Other Ambulatory Visit: Payer: Medicare Other

## 2018-11-15 ENCOUNTER — Ambulatory Visit
Admission: RE | Admit: 2018-11-15 | Discharge: 2018-11-15 | Disposition: A | Discharge status: Home / Self Care | Payer: Medicare Other | Source: Ambulatory Visit | Attending: Nephrology | Admitting: Nephrology

## 2018-11-15 DIAGNOSIS — N2581 Secondary hyperparathyroidism of renal origin: Secondary | ICD-10-CM

## 2018-11-15 DIAGNOSIS — I2510 Atherosclerotic heart disease of native coronary artery without angina pectoris: Secondary | ICD-10-CM

## 2018-11-15 DIAGNOSIS — D631 Anemia in chronic kidney disease: Secondary | ICD-10-CM

## 2018-11-15 DIAGNOSIS — E0822 Diabetes mellitus due to underlying condition with diabetic chronic kidney disease: Secondary | ICD-10-CM

## 2018-11-15 DIAGNOSIS — N183 Chronic kidney disease, stage 3 unspecified: Secondary | ICD-10-CM

## 2018-11-15 DIAGNOSIS — I129 Hypertensive chronic kidney disease with stage 1 through stage 4 chronic kidney disease, or unspecified chronic kidney disease: Secondary | ICD-10-CM

## 2018-11-15 DIAGNOSIS — N189 Chronic kidney disease, unspecified: Secondary | ICD-10-CM

## 2018-11-15 DIAGNOSIS — E785 Hyperlipidemia, unspecified: Secondary | ICD-10-CM

## 2018-11-15 DIAGNOSIS — I4891 Unspecified atrial fibrillation: Secondary | ICD-10-CM

## 2018-11-15 DIAGNOSIS — I2583 Coronary atherosclerosis due to lipid rich plaque: Secondary | ICD-10-CM

## 2018-11-15 DIAGNOSIS — Z794 Long term (current) use of insulin: Secondary | ICD-10-CM

## 2018-11-19 ENCOUNTER — Ambulatory Visit
Admission: RE | Admit: 2018-11-19 | Discharge: 2018-11-19 | Disposition: A | Discharge status: Home / Self Care | Payer: Medicare Other | Source: Ambulatory Visit | Attending: Neurology | Admitting: Neurology

## 2018-11-19 DIAGNOSIS — M5000 Cervical disc disorder with myelopathy, unspecified cervical region: Secondary | ICD-10-CM

## 2018-11-19 DIAGNOSIS — R270 Ataxia, unspecified: Secondary | ICD-10-CM | POA: Diagnosis not present

## 2018-11-19 DIAGNOSIS — W19XXXA Unspecified fall, initial encounter: Secondary | ICD-10-CM

## 2018-11-19 DIAGNOSIS — R292 Abnormal reflex: Secondary | ICD-10-CM | POA: Diagnosis not present

## 2018-11-19 DIAGNOSIS — R258 Other abnormal involuntary movements: Secondary | ICD-10-CM

## 2018-11-22 ENCOUNTER — Telehealth: Payer: Self-pay | Admitting: *Deleted

## 2018-11-22 NOTE — Telephone Encounter (Signed)
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 thanks

## 2018-11-22 NOTE — Telephone Encounter (Signed)
Spoke with Mr. Rhoda and reviewed below MRI results.  He verbalized understanding of same/fim

## 2018-11-25 ENCOUNTER — Ambulatory Visit (INDEPENDENT_AMBULATORY_CARE_PROVIDER_SITE_OTHER): Payer: Medicare Other | Admitting: Neurology

## 2018-11-25 ENCOUNTER — Other Ambulatory Visit: Payer: Self-pay

## 2018-11-25 DIAGNOSIS — R0683 Snoring: Secondary | ICD-10-CM

## 2018-11-25 DIAGNOSIS — G4733 Obstructive sleep apnea (adult) (pediatric): Secondary | ICD-10-CM

## 2018-11-25 DIAGNOSIS — E113393 Type 2 diabetes mellitus with moderate nonproliferative diabetic retinopathy without macular edema, bilateral: Secondary | ICD-10-CM

## 2018-11-25 DIAGNOSIS — Z951 Presence of aortocoronary bypass graft: Secondary | ICD-10-CM

## 2018-11-25 DIAGNOSIS — G4710 Hypersomnia, unspecified: Secondary | ICD-10-CM

## 2018-11-25 DIAGNOSIS — N184 Chronic kidney disease, stage 4 (severe): Secondary | ICD-10-CM

## 2018-11-25 DIAGNOSIS — G4730 Sleep apnea, unspecified: Secondary | ICD-10-CM

## 2018-11-25 DIAGNOSIS — I25810 Atherosclerosis of coronary artery bypass graft(s) without angina pectoris: Secondary | ICD-10-CM

## 2018-11-29 DIAGNOSIS — N184 Chronic kidney disease, stage 4 (severe): Secondary | ICD-10-CM | POA: Insufficient documentation

## 2018-11-29 DIAGNOSIS — G4730 Sleep apnea, unspecified: Secondary | ICD-10-CM

## 2018-11-29 DIAGNOSIS — R0683 Snoring: Secondary | ICD-10-CM | POA: Insufficient documentation

## 2018-11-29 DIAGNOSIS — G4710 Hypersomnia, unspecified: Secondary | ICD-10-CM | POA: Insufficient documentation

## 2018-11-29 NOTE — Addendum Note (Signed)
Addended by: Larey Seat on: 11/29/2018 02:54 PM   Modules accepted: Orders

## 2018-11-29 NOTE — Procedures (Signed)
PATIENT'S NAME:  Fernando Hess, Fernando Hess  DOB:      [birth year]      MR#:    [id number]     DATE OF RECORDING: 11/25/2018 Fernando Hess REFERRING M.D.:  Fernando Cruel, MD Study Performed:   Baseline Polysomnogram HISTORY:  Fernando Hess is a 73 y.o. male patient who reports having headaches any time of the day, and his ophthalmologist Dr. Deloria Lair, MD suggested the PSG test.  He suspected hypoxemia contributing to abnormal ischemia vascular congestion. Patient has been physically inactive since Bypass surgery and is deconditioned.     PAC's, CKD stage 3, ED, Reflux,  A-fib, Hx of NSTEM MI, Hyperlipidemia, Kidney stone, Atherosclerosis of both carotid arteries, RBBB, Diabetes  The patient endorsed the Epworth Sleepiness Scale at 10/24 points.   The patient's weight 201 pounds with a height of 56 (inches), resulting in a BMI of 45.1 kg/m2. The patient's neck circumference measured 16.2 inches.  CURRENT MEDICATIONS: ASA 81mg , Lipitor, Cymbalta, Amaryl, Basaglar KwikPen, Toprol, Multivitamin, Nitrostat, Zofran, Actos, Revatio, Januvia   PROCEDURE:  This is a multichannel digital polysomnogram utilizing the Somnostar 11.2 system.  Electrodes and sensors were applied and monitored per AASM Specifications.   EEG, EOG, Chin and Limb EMG, were sampled at 200 Hz.  ECG, Snore and Nasal Pressure, Thermal Airflow, Respiratory Effort, CPAP Flow and Pressure, Oximetry was sampled at 50 Hz. Digital video and audio were recorded.       BASELINE STUDY: Lights Out was at 21:29 and Lights On at 05:08.  Total recording time (TRT) was 459 minutes, with a total sleep time (TST) of 204 minutes. The patient's sleep latency was 90 minutes.  REM latency was 317 minutes.  The sleep efficiency was poor at 44.4 %.     SLEEP ARCHITECTURE: WASO (Wake after sleep onset) was 170.5 minutes. There were 41 minutes in Stage N1, 106 minutes Stage N2, 0 minutes Stage N3 and 57 minutes in Stage REM.  The percentage of Stage N1 was 20.1%, Stage N2 was  52.%, Stage N3 was 0% and Stage R (REM sleep) was 27.9%.  The arousals were noted as: 91 were spontaneous, 0 were associated with PLMs, and 58 were associated with respiratory events.  RESPIRATORY ANALYSIS:  There were a total of 129 respiratory events:  16 obstructive apneas, 27 central apneas and 0 mixed apneas with a total of 43 apneas and an apnea index (AI) of 12.6 /hour. There were 86 hypopneas with a hypopnea index of 25.3 /hour. The patient also had 0 respiratory event related arousals (RERAs).     The total APNEA/HYPOPNEA INDEX (AHI) was 37.9 /hour and the total RESPIRATORY DISTURBANCE INDEX was 37.9 /hour.  23 events occurred in REM sleep and 163 events in NREM. The REM AHI was 0. 24.2 /hour, versus a non-REM AHI of 43.3. The patient spent 40 minutes of total sleep time in the supine position and 164 minutes in non-supine. The supine AHI was 58.5/h versus a non-supine AHI of 32.9.  OXYGEN SATURATION & C02:  The Wake baseline 02 saturation was 94%, with the lowest being 79%. Time spent below 89% saturation equaled 58 minutes.  PERIODIC LIMB MOVEMENTS:  The arousals were noted as: 91 were spontaneous, 0 were associated with PLMs, and 58 were associated with respiratory events. Audio and video analysis did not show any abnormal or unusual movements, behaviors, phonations or vocalizations.  Snoring was noted.   IMPRESSION:  1. Severe Obstructive Sleep Apnea (OSA) at AHI 37.9/h and supine AHI  was 58.5/h.  2. Hypoxemia during REM sleep at the very end of the sleep study. 3. Abnormal EKG.   RECOMMENDATIONS:  1. Advise full-night, attended, CPAP titration study to optimize therapy.  2. Please review EEG.     I certify that I have reviewed the entire raw data recording prior to the issuance of this report in accordance with the Standards of Accreditation of the American Academy of Sleep Medicine (AASM)   Fernando Seat, MD   11-29-2018 Diplomat, American Board of Psychiatry and  Neurology  Diplomat, American Board of [place] Director, [employer] [facility] [employer]

## 2018-11-30 ENCOUNTER — Telehealth: Payer: Self-pay | Admitting: Neurology

## 2018-11-30 NOTE — Telephone Encounter (Signed)
I called pt. I advised pt that Dr. Brett Fairy reviewed their sleep study results and found that the patient has severe sleep apnea and recommends that pt be treated with a cpap. Dr. Brett Fairy recommends that pt return for a repeat sleep study in order to properly titrate the cpap and ensure a good mask fit. Pt is agreeable to returning for a titration study. I advised pt that our sleep lab will file with pt's insurance and call pt to schedule the sleep study when we hear back from the pt's insurance regarding coverage of this sleep study. Pt verbalized understanding of results. Pt had no questions at this time but was encouraged to call back if questions arise.

## 2018-11-30 NOTE — Telephone Encounter (Signed)
-----   Message from Larey Seat, MD sent at 11/29/2018  2:54 PM EDT ----- IMPRESSION:  1. Severe Obstructive Sleep Apnea (OSA) at AHI 37.9/h and supine  AHI was 58.5/h.  2. Hypoxemia during REM sleep at the very end of the sleep study. 3. Abnormal EKG.  RECOMMENDATIONS:  1. Advise full-night, attended, CPAP titration study to optimize  therapy.  2. Please review EEG.

## 2018-12-23 ENCOUNTER — Telehealth: Payer: Self-pay | Admitting: Neurology

## 2018-12-23 ENCOUNTER — Other Ambulatory Visit: Payer: Self-pay | Admitting: Neurology

## 2018-12-23 DIAGNOSIS — G4710 Hypersomnia, unspecified: Secondary | ICD-10-CM

## 2018-12-23 DIAGNOSIS — E119 Type 2 diabetes mellitus without complications: Secondary | ICD-10-CM

## 2018-12-23 DIAGNOSIS — G4733 Obstructive sleep apnea (adult) (pediatric): Secondary | ICD-10-CM

## 2018-12-23 DIAGNOSIS — I25810 Atherosclerosis of coronary artery bypass graft(s) without angina pectoris: Secondary | ICD-10-CM

## 2018-12-23 DIAGNOSIS — G4730 Sleep apnea, unspecified: Secondary | ICD-10-CM

## 2018-12-23 DIAGNOSIS — R0683 Snoring: Secondary | ICD-10-CM

## 2018-12-23 NOTE — Telephone Encounter (Signed)
I called pt. Advised the patient that due to the coronavirus we are unable to bring the patient's in to the sleep lab for the in lab titration. Informed that Dr. Brett Fairy recommends that pt get set up on auto CPAP 5-15 cm water pressure. I reviewed PAP compliance expectations with the pt. Pt is agreeable to starting a CPAP. I advised pt that an order will be sent to a DME, [facility], and [facility] will call the pt within about one week after they file with the pt's insurance. [facility] will show the pt how to use the machine, fit for masks, and troubleshoot the CPAP if needed. A follow up appt was made for insurance purposes with Debbora Presto, NP on June 22,2020 at 8:30 am. Pt verbalized understanding to arrive 15 minutes early and bring their CPAP. A letter with all of this information in it will be mailed to the pt as a reminder. I verified with the pt that the address we have on file is correct. Pt verbalized understanding of results. Pt had no questions at this time but was encouraged to call back if questions arise. I have sent the order to [facility] and have received confirmation that they have received the order.

## 2018-12-23 NOTE — Telephone Encounter (Signed)
Patient had sleep study 11/25/2018. AHI is 37.9, He is waiting cpap titration. Can we get an auto 5-15 order and follow up visit for him per Robin.

## 2019-01-12 ENCOUNTER — Ambulatory Visit: Payer: Self-pay | Admitting: Neurology

## 2019-01-24 ENCOUNTER — Encounter: Payer: Self-pay | Admitting: Internal Medicine

## 2019-01-31 ENCOUNTER — Encounter: Payer: Self-pay | Admitting: Internal Medicine

## 2019-02-04 ENCOUNTER — Telehealth: Payer: Self-pay | Admitting: *Deleted

## 2019-02-04 NOTE — Telephone Encounter (Signed)
PT CONSENTED TO TELEPHONE VISIT   Confirm consent - "In the setting of the current Covid19 crisis, you are scheduled for a (phone or video) visit with your provider on (date) at (time).  Just as we do with many in-office visits, in order for you to participate in this visit, we must obtain consent.  If you'd like, I can send this to your mychart (if signed up) or email for you to review.  Otherwise, I can obtain your verbal consent now.  All virtual visits are billed to your insurance company just like a normal visit would be.  By agreeing to a virtual visit, we'd like you to understand that the technology does not allow for your provider to perform an examination, and thus may limit your provider's ability to fully assess your condition. If your provider identifies any concerns that need to be evaluated in person, we will make arrangements to do so.  Finally, though the technology is pretty good, we cannot assure that it will always work on either your or our end, and in the setting of a video visit, we may have to convert it to a phone-only visit.  In either situation, we cannot ensure that we have a secure connection.  Are you willing to proceed?" STAFF: Did the patient verbally acknowledge consent to telehealth visit? Document YES/NO here: YES   1. Advise patient to be prepared - "Two hours prior to your appointment, go ahead and check your blood pressure, pulse, oxygen saturation, and your weight (if you have the equipment to check those) and write them all down. When your visit starts, your provider will ask you for this information. If you have an Apple Watch or Kardia device, please plan to have heart rate information ready on the day of your appointment. Please have a pen and paper handy nearby the day of the visit as well."  2. Give patient instructions for MyChart download to smartphone OR Doximity/Doxy.me as below if video visit (depending on what platform provider is using)  3. Inform  patient they will receive a phone call 15 minutes prior to their appointment time (may be from unknown caller ID) so they should be prepared to answer    TELEPHONE CALL NOTE  Fernando Hess has been deemed a candidate for a follow-up tele-health visit to limit community exposure during the Covid-19 pandemic. I spoke with the patient via phone to ensure availability of phone/video source, confirm preferred email & phone number, and discuss instructions and expectations.  I reminded Fernando Hess to be prepared with any vital sign and/or heart rhythm information that could potentially be obtained via home monitoring, at the time of his visit. I reminded Fernando Hess to expect a phone call prior to his visit.  Claude Manges, [place] 02/04/2019 2:42 PM   INSTRUCTIONS FOR DOWNLOADING THE MYCHART APP TO SMARTPHONE  - The patient must first make sure to have activated MyChart and know their login information - If Apple, go to [employer] and type in MyChart in the search bar and download the app. If Android, ask patient to go to [employer] and type in [place] in the search bar and download the app. The app is free but as with any other app downloads, their phone may require them to verify saved payment information or Apple/Android password.  - The patient will need to then log into the app with their MyChart username and password, and select [facility] as their healthcare  provider to link the account. When it is time for your visit, go to the MyChart app, find appointments, and click Begin Video Visit. Be sure to Select Allow for your device to access the Microphone and Camera for your visit. You will then be connected, and your provider will be with you shortly.  **If they have any issues connecting, or need assistance please contact MyChart service desk [phone])**  **If using a computer, in order to ensure the best quality for their visit they will need to use either of the  following Internet Browsers: [employer], or Google Chrome**  IF USING DOXIMITY or DOXY.ME - The patient will receive a link just prior to their visit by text.     FULL LENGTH CONSENT FOR TELE-HEALTH VISIT   I hereby voluntarily request, consent and authorize [facility] and its employed or contracted physicians, physician assistants, nurse practitioners or other licensed health care professionals (the Practitioner), to provide me with telemedicine health care services (the "Services") as deemed necessary by the treating Practitioner. I acknowledge and consent to receive the Services by the Practitioner via telemedicine. I understand that the telemedicine visit will involve communicating with the Practitioner through live audiovisual communication technology and the disclosure of certain medical information by electronic transmission. I acknowledge that I have been given the opportunity to request an in-person assessment or other available alternative prior to the telemedicine visit and am voluntarily participating in the telemedicine visit.  I understand that I have the right to withhold or withdraw my consent to the use of telemedicine in the course of my care at any time, without affecting my right to future care or treatment, and that the Practitioner or I may terminate the telemedicine visit at any time. I understand that I have the right to inspect all information obtained and/or recorded in the course of the telemedicine visit and may receive copies of available information for a reasonable fee.  I understand that some of the potential risks of receiving the Services via telemedicine include:  [name] Delay or interruption in medical evaluation due to technological equipment failure or disruption; . Information transmitted may not be sufficient (e.g. poor resolution of images) to allow for appropriate medical decision making by the Practitioner; and/or  . In rare instances, security protocols  could fail, causing a breach of personal health information.  Furthermore, I acknowledge that it is my responsibility to provide information about my medical history, conditions and care that is complete and accurate to the best of my ability. I acknowledge that Practitioner's advice, recommendations, and/or decision may be based on factors not within their control, such as incomplete or inaccurate data provided by me or distortions of diagnostic images or specimens that may result from electronic transmissions. I understand that the practice of medicine is not an exact science and that Practitioner makes no warranties or guarantees regarding treatment outcomes. I acknowledge that I will receive a copy of this consent concurrently upon execution via email to the email address I last provided but may also request a printed copy by calling the office of [place].    I understand that my insurance will be billed for this visit.   I have read or had this consent read to me. . I understand the contents of this consent, which adequately explains the benefits and risks of the Services being provided via telemedicine.  . I have been provided ample opportunity to ask questions regarding this consent and the Services and have  had my questions answered to my satisfaction. . I give my informed consent for the services to be provided through the use of telemedicine in my medical care  By participating in this telemedicine visit I agree to the above.

## 2019-02-08 NOTE — Progress Notes (Signed)
Virtual Visit via Telephone Note   This visit type was conducted due to national recommendations for restrictions regarding the COVID-19 Pandemic (e.g. social distancing) in an effort to limit this patient's exposure and mitigate transmission in our community.  Due to his co-morbid illnesses, this patient is at least at moderate risk for complications without adequate follow up.  This format is felt to be most appropriate for this patient at this time.  The patient did not have access to video technology/had technical difficulties with video requiring transitioning to audio format only (telephone).  All issues noted in this document were discussed and addressed.  No physical exam could be performed with this format.  Please refer to the patient's chart for his  consent to telehealth for [facility].   Date:  02/09/2019   ID:  Fernando Hess, DOB 01-21-1946, MRN [id number]  Patient Location: Other:  sitting in car outside our [facility] office Provider Location: Home   PCP:  [name], MD  Cardiologist:  Ena Dawley, MD   Electrophysiologist:  None  Sleep: Dr. Brett Fairy Nephrologist: Dr. Justin Mend Evaluation Performed:  Follow-Up Visit  Chief Complaint:  FU on CAD   History of Present Illness:    Fernando Hess is a 73 y.o. male with coronary artery disease status post CABG, diabetes, hypertension, hyperlipidemia, chronic kidney disease, obstructive sleep apnea.  He had an MI in 09/2015 and underwent multivessel CABG.  He presented back to the hospital in 10/2015 with an inferolateral STEMI.  Cardiac Catheterization demonstrated an occluded S-OM1 and S-OM2.  The distal OM was occluded.  He has diabetic appearing vessels and medical Rx was recommended.  He is a prior patient of Dr. Thurman Coyer.  He established with our practice in 05/2018.  He and his wife Fernando Hess) are both followed by Dr. Meda Coffee.  He saw Dr. Meda Coffee in 09/2018.  He complained of dizziness and had fallen.  His beta-blocker  was cut in 1/2.    Today, he is doing well.  He denies any symptoms of chest discomfort, shortness of breath, syncope, dizziness, rapid palpitations, orthopnea or lower extremity swelling.  He does describe bilateral calf discomfort with walking.  He tells me that he can walk about a quarter of a mile without symptoms.  However, if he walks a half a mile, he gets severe calf pain bilaterally.  He has not developed any nonhealing wounds in his feet.  The patient does not have symptoms concerning for COVID-19 infection (fever, chills, cough, or new shortness of breath).    Past Medical History:  Diagnosis Date  . Atrial premature complexes   . CAD (coronary artery disease), native coronary artery cardiologist-- dr Raliegh Ip. nelson   hx NSTEMI 09-24-2015  s/p  CABG x5 on 10-01-2017;  post op STEMI inferolateral wall,  SVG OM1 and SVG OM2 occluded, distal OM occlusion the calpruit, treated medically  . CKD (chronic kidney disease), stage III ([place])   . Erectile dysfunction   . Esophageal reflux   . History of atrial fibrillation    post op CABG 10-02-2015  . History of kidney stones   . History of non-ST elevation myocardial infarction (NSTEMI) 09/24/2015   s/p  CABG x5  . History of ST elevation myocardial infarction (STEMI) 10/22/2015   inferior wall,  post op CABG 10-02-2015  . Hyperlipidemia   . Hypertension   . Kidney stone    h/o  . Left ureteral stone   . Mild atherosclerosis of both carotid  arteries   . Nephrolithiasis    per CT bilateral non-obstructive calculi  . RBBB (right bundle branch block)   . Renal atrophy, right   . Sleep apnea    wears cpap   . ST elevation myocardial infarction (STEMI) of inferior wall ([place]) 10/22/2015  . Type 2 diabetes mellitus treated with insulin ([place])    followed by pcp  . Wears glasses    Past Surgical History:  Procedure Laterality Date  . APPENDECTOMY  1965  . CARDIAC CATHETERIZATION N/A 09/26/2015   Procedure: Left Heart Cath and Coronary  Angiography;  Surgeon: Troy Sine, MD;  Location: [place] CV LAB;  Service: Cardiovascular;  Laterality: N/A;  . CARDIAC CATHETERIZATION N/A 10/22/2015   Procedure: Left Heart Cath and Coronary Angiography;  Surgeon: Sherren Mocha, MD;  Location: [place] CV [facility];  Service: Cardiovascular;  Laterality: N/A;  . CATARACT EXTRACTION W/ INTRAOCULAR LENS  IMPLANT, BILATERAL  2017  . COLONOSCOPY    . CORONARY ARTERY BYPASS GRAFT N/A 10/02/2015   Procedure: CORONARY ARTERY BYPASS GRAFTING (CABG) X5 LIMA-LAD; SVG-DIAG; SVG-OM; SVG-PD; SVG-RAMUS TRANSESOPHAGEAL ECHOCARDIOGRAM (TEE) ENDOSCOPIC GREATER SAPHENOUS VEIN HARVEST BILAT LE;  Surgeon: Ivin Poot, MD;  Location: [place];  Service: Open Heart Surgery;  Laterality: N/A;  . CYSTOSCOPY/URETEROSCOPY/HOLMIUM LASER/STENT PLACEMENT Left 08/10/2018   Procedure: CYSTOSCOPY/URETEROSCOPY/HOLMIUM LASER/STENT PLACEMENT;  Surgeon: Festus Aloe, MD;  Location: [facility];  Service: Urology;  Laterality: Left;  . CYSTOSCOPY/URETEROSCOPY/HOLMIUM LASER/STENT PLACEMENT Left 09/10/2018   Procedure: CYSTOSCOPY/URETEROSCOPY/HOLMIUM LASER/STENT EXCHANGE;  Surgeon: Festus Aloe, MD;  Location: [facility];  Service: Urology;  Laterality: Left;  . LEFT HEART CATHETERIZATION WITH CORONARY ANGIOGRAM N/A 04/13/2014   Procedure: LEFT HEART CATHETERIZATION WITH CORONARY ANGIOGRAM;  Surgeon: Jacolyn Reedy, MD;  Location: [facility] CATH LAB;  Service: Cardiovascular;  Laterality: N/A;  . LEG SURGERY Right age 24   closed reduction leg fracture  . POLYPECTOMY    . TEE WITHOUT CARDIOVERSION N/A 10/02/2015   Procedure: TRANSESOPHAGEAL ECHOCARDIOGRAM (TEE);  Surgeon: Ivin Poot, MD;  Location: [facility];  Service: Open Heart Surgery;  Laterality: N/A;  . URETEROSCOPY WITH HOLMIUM LASER LITHOTRIPSY Bilateral 2004;  2005  dr Risa Grill  @[facility]   . VASECTOMY       Current Meds  Medication Sig  . aspirin 81 MG chewable tablet Chew 1 tablet (81 mg total) by mouth  daily.  [name] atorvastatin (LIPITOR) 40 MG tablet Take 40 mg by mouth every morning.   . DULoxetine (CYMBALTA) 60 MG capsule Take 60 mg by mouth daily.   [name] glimepiride (AMARYL) 4 MG tablet Take 4 mg by mouth 2 (two) times daily.   . Insulin Glargine (BASAGLAR KWIKPEN) 100 UNIT/ML SOPN Inject 42 Units into the skin daily.   [name] linagliptin (TRADJENTA) 5 MG TABS tablet Take 5 mg by mouth daily.  . metoprolol succinate (TOPROL XL) 25 MG 24 hr tablet Take 0.5 tablets (12.5 mg total) by mouth daily.  . Multiple Vitamin (MULTIVITAMIN) tablet Take 1 tablet by mouth daily.  . nitroGLYCERIN (NITROSTAT) 0.4 MG SL tablet Place 0.4 mg under the tongue every 5 (five) minutes as needed for chest pain.  [name] ondansetron (ZOFRAN-ODT) 4 MG disintegrating tablet Take 4 mg by mouth every 8 (eight) hours as needed for nausea or vomiting.   . pioglitazone (ACTOS) 45 MG tablet Take 45 mg by mouth daily.  . sildenafil (REVATIO) 20 MG tablet Take 20 mg by mouth daily as needed (for ED).      Allergies:  Victoza [liraglutide]; Actos [pioglitazone]; Levaquin [levofloxacin]; and Lisinopril   Social History   Tobacco Use  . Smoking status: Never Smoker  . Smokeless tobacco: Never Used  Substance Use Topics  . Alcohol use: Not Currently  . Drug use: Never     Family Hx: The patient's family history includes Diabetes in his brother and brother; Heart attack in his father and mother; Pancreatic cancer in his brother. There is no history of Colon cancer, Esophageal cancer, Prostate cancer, Rectal cancer, Stomach cancer, or Colon polyps.  ROS:   Please see the history of present illness.     All other systems reviewed and are negative.   Prior CV studies:   The following studies were reviewed today:  Myoview 10/21/2018 Low risk, mildly abnormal stress nuclear study with apical thinning but no ischemia; EF 52 with mild global hypokinesis   Holter 06/11/18 Very frequent supraventricular ectopy, contributing to 15% of  overall beats. Very frequent couplets and a very short runs of atrial tachycardia.  Echocardiogram 10/23/2015 EF 55-60, normal wall motion, mildly calcified aortic valve leaflets, mild AI, mild to moderate MR   Cardiac catheterization 10/22/2015 LM ostial 50 LAD mid 90 RI ostial 90 LCx proximal 80; OM1 80; OM2 100 (culprit for MI) RCA mid 50 LIMA-LAD patent SVG-RPDA patent SVG-D1 patent SVG-OM1 100 SVG-OM2 100 Medical therapy   Pre-CABG Dopplers 09/28/2015 Summary: Findings suggest 1-39% internal carotid artery stenosis bilaterally. Vertebral arteries are patent with antegrade flow. Bilateral ABIs are within normal limits.     Labs/Other Tests and Data Reviewed:    EKG:  No ECG reviewed.  Recent Labs: 08/07/2018: ALT 30 09/06/2018: BUN 32; Creatinine, Ser 1.82; Hemoglobin 13.6; Platelets 174; Potassium 5.2; Sodium 139   Recent Lipid Panel Lab Results  Component Value Date/Time   CHOL 81 10/23/2015 05:15 AM   TRIG 102 10/23/2015 05:15 AM   HDL 30 (L) 10/23/2015 05:15 AM   CHOLHDL 2.7 10/23/2015 05:15 AM   LDLCALC 31 10/23/2015 05:15 AM     Wt Readings from Last 3 Encounters:  02/09/19 211 lb (95.7 kg)  02/09/19 201 lb (91.2 kg)  11/11/18 201 lb (91.2 kg)     Objective:    Vital Signs:  Ht 5\' 9"  (1.753 m)   Wt 201 lb (91.2 kg)   BMI 29.68 kg/m    VITAL SIGNS:  reviewed GEN:  no acute distress RESPIRATORY:  No labored breathing NEURO:  Alert and oriented PSYCH:  He is in good spirits  ASSESSMENT & PLAN:     Coronary artery disease involving coronary bypass graft of native heart without angina pectoris Hx of CABG in 2017.  He returned to the hospital several weeks later with an inferolateral STEMI.  The vein grafts to both OMs were occluded and the culprit was a distal OM occlusion.  He was treated medically.  He had a follow up ETT with Dr. [name] in 12/2015 that was electrically positive but clinically negative.    Myoview in February 2020 was low risk  without ischemia.  He is not having any symptoms of angina.  Continue aspirin, statin, low-dose beta-blocker.  Claudication [facility]) He does describe symptoms of claudication.  He did have normal ABIs prior to his bypass in 2017.  However, since he is a diabetic, he is at risk for peripheral arterial disease.  Therefore, I will arrange ABIs/arterial Dopplers.  Essential hypertension He does not check his blood pressure at home.  However, he notes that his blood pressure is typically  120s over 70s at most of his clinic appointments.  Hyperlipidemia, unspecified hyperlipidemia type LDL optimal on most recent lab work.  Continue current Rx.    Premature atrial beats Holter in September 2019 demonstrated frequent supraventricular ectopy short runs of atrial tachycardia.  His beta-blocker dose was reduced at last visit with Dr. Meda Coffee due to dizziness.  He is currently asymptomatic.  Continue current dose of metoprolol succinate.  CKD (chronic kidney disease) stage 4, GFR 15-29 ml/min ([name]) Continue follow-up with nephrology.  Sleep apnea He notes continued fatigue despite starting on CPAP therapy.  I have asked him to this further with his sleep medicine specialist.  COVID-19 Education: The signs and symptoms of COVID-19 were discussed with the patient and how to seek care for testing (follow up with PCP or arrange E-visit).  The importance of social distancing was discussed today.  Time:   Today, I have spent 18.5 minutes with the patient with telehealth technology discussing the above problems.     Medication Adjustments/Labs and Tests Ordered: Current medicines are reviewed at length with the patient today.  Concerns regarding medicines are outlined above.   Tests Ordered: No orders of the defined types were placed in this encounter.   Medication Changes: No orders of the defined types were placed in this encounter.   Disposition:  Follow up in 6 month(s)  Signed, [name],  [name]  02/09/2019 5:31 PM    [facility]

## 2019-02-09 ENCOUNTER — Ambulatory Visit: Payer: Medicare Other | Admitting: *Deleted

## 2019-02-09 ENCOUNTER — Other Ambulatory Visit: Payer: Self-pay

## 2019-02-09 ENCOUNTER — Telehealth (INDEPENDENT_AMBULATORY_CARE_PROVIDER_SITE_OTHER): Payer: Medicare Other | Admitting: Physician Assistant

## 2019-02-09 ENCOUNTER — Other Ambulatory Visit: Payer: Self-pay | Admitting: Physician Assistant

## 2019-02-09 ENCOUNTER — Encounter: Payer: Self-pay | Admitting: Internal Medicine

## 2019-02-09 ENCOUNTER — Encounter: Payer: Self-pay | Admitting: Physician Assistant

## 2019-02-09 VITALS — Ht 69.0 in | Wt 201.0 lb

## 2019-02-09 VITALS — Ht 66.0 in | Wt 211.0 lb

## 2019-02-09 DIAGNOSIS — Z86010 Personal history of colonic polyps: Secondary | ICD-10-CM

## 2019-02-09 DIAGNOSIS — Z7189 Other specified counseling: Secondary | ICD-10-CM

## 2019-02-09 DIAGNOSIS — I25810 Atherosclerosis of coronary artery bypass graft(s) without angina pectoris: Secondary | ICD-10-CM

## 2019-02-09 DIAGNOSIS — E785 Hyperlipidemia, unspecified: Secondary | ICD-10-CM

## 2019-02-09 DIAGNOSIS — I739 Peripheral vascular disease, unspecified: Secondary | ICD-10-CM

## 2019-02-09 DIAGNOSIS — N184 Chronic kidney disease, stage 4 (severe): Secondary | ICD-10-CM

## 2019-02-09 DIAGNOSIS — I491 Atrial premature depolarization: Secondary | ICD-10-CM

## 2019-02-09 DIAGNOSIS — I10 Essential (primary) hypertension: Secondary | ICD-10-CM

## 2019-02-09 MED ORDER — NA SULFATE-K SULFATE-MG SULF 17.5-3.13-1.6 GM/177ML PO SOLN: 1.0000 | Freq: Once | ORAL | 0 refills | Status: AC

## 2019-02-09 NOTE — Patient Instructions (Signed)
Medication Instructions:  Continue current medications  If you need a refill on your cardiac medications before your next appointment, please call your pharmacy.   Lab work: None  If you have labs (blood work) drawn today and your tests are completely normal, you will receive your results only by: [name] MyChart Message (if you have MyChart) OR . A paper copy in the mail If you have any lab test that is abnormal or we need to change your treatment, we will call you to review the results.  Testing/Procedures: Our office will arrange ABIs/arterial Dopplers to evaluate your leg pain  Follow-Up: At [facility], you and your health needs are our priority.  As part of our continuing mission to provide you with exceptional heart care, we have created designated Provider Care Teams.  These Care Teams include your primary Cardiologist (physician) and Advanced Practice Providers (APPs -  Physician Assistants and Nurse Practitioners) who all work together to provide you with the care you need, when you need it. You will need a follow up appointment in:  6 months.  Please call our office 2 months in advance to schedule this appointment.  You may see Ena Dawley, MD or [name], [name]   Any Other Special Instructions Will Be Listed Below (If Applicable).

## 2019-02-09 NOTE — Progress Notes (Signed)
No egg or soy allergy known to patient  No issues with past sedation with any surgeries  or procedures, no intubation problems  No diet pills per patient No home 02 use per patient  No blood thinners per patient  Pt denies issues with constipation  No A fib or A flutter  EMMI video sent to pt's e mail   Pt verified name, DOB, address and insurance during PV today. Pt mailed instruction packet to included paper to complete and mail back to [facility] with addressed and stamped envelope, Emmi video, copy of consent form to read and not return, and instructions.  PV completed over the phone. Pt encouraged to call with questions or issues  

## 2019-02-11 ENCOUNTER — Other Ambulatory Visit: Payer: Self-pay

## 2019-02-11 ENCOUNTER — Ambulatory Visit (HOSPITAL_COMMUNITY)
Admission: RE | Admit: 2019-02-11 | Discharge: 2019-02-11 | Disposition: A | Discharge status: Home / Self Care | Payer: Medicare Other | Source: Ambulatory Visit | Attending: Internal Medicine | Admitting: Internal Medicine

## 2019-02-11 DIAGNOSIS — I739 Peripheral vascular disease, unspecified: Secondary | ICD-10-CM | POA: Diagnosis not present

## 2019-02-14 ENCOUNTER — Encounter: Payer: Self-pay | Admitting: Physician Assistant

## 2019-02-14 ENCOUNTER — Telehealth: Payer: Self-pay | Admitting: Nurse Practitioner

## 2019-02-14 DIAGNOSIS — I739 Peripheral vascular disease, unspecified: Secondary | ICD-10-CM

## 2019-02-14 NOTE — Telephone Encounter (Signed)
Reviewed results of lower extremity vascular studies with patient who verbalized understanding and agreement for referral to Drs. Gwenlyn Found or [place]. He asked about walking since it is painful and I encouraged him to walk as much as possible, even if he can only tolerate 10 minutes at the time several times per day. I advised patient will get a call back regarding an appointment. He thanked me for the call.

## 2019-02-18 ENCOUNTER — Telehealth: Payer: Self-pay | Admitting: *Deleted

## 2019-02-18 NOTE — Telephone Encounter (Signed)
Covid-19 screening questions  Have you traveled in the last 14 days? No. If yes where?  Do you now or have you had a fever in the last 14 days? No.  Do you have any respiratory symptoms of shortness of breath or cough now or in the last 14 days? No.  Do you have any family members or close contacts with diagnosed or suspected Covid-19 in the past 14 days? No.  Have you been tested for Covid-19 and found to be positive? No.       

## 2019-02-21 ENCOUNTER — Ambulatory Visit (AMBULATORY_SURGERY_CENTER): Payer: Medicare Other | Admitting: Internal Medicine

## 2019-02-21 ENCOUNTER — Encounter: Payer: Self-pay | Admitting: Internal Medicine

## 2019-02-21 ENCOUNTER — Other Ambulatory Visit: Payer: Self-pay

## 2019-02-21 VITALS — BP 111/60 | HR 68 | Temp 98.9°F | Resp 16 | Ht 66.0 in | Wt 211.0 lb

## 2019-02-21 DIAGNOSIS — D122 Benign neoplasm of ascending colon: Secondary | ICD-10-CM | POA: Diagnosis not present

## 2019-02-21 DIAGNOSIS — Z86010 Personal history of colonic polyps: Secondary | ICD-10-CM | POA: Diagnosis not present

## 2019-02-21 MED ORDER — SODIUM CHLORIDE 0.9 % IV SOLN: 500.0000 mL | Freq: Once | INTRAVENOUS | Status: DC

## 2019-02-21 NOTE — Progress Notes (Signed)
Pt. Reports no change in his medical or surgical history since his pre-visit 02/21/2019.

## 2019-02-21 NOTE — Progress Notes (Signed)
PT taken to PACU. Monitors in place. VSS. Report given to RN. 

## 2019-02-21 NOTE — Patient Instructions (Signed)
YOU HAD AN ENDOSCOPIC PROCEDURE TODAY AT [facility]:   Refer to the procedure report that was given to you for any specific questions about what was found during the examination.  If the procedure report does not answer your questions, please call your gastroenterologist to clarify.  If you requested that your care partner not be given the details of your procedure findings, then the procedure report has been included in a sealed envelope for you to review at your convenience later.  YOU SHOULD EXPECT: Some feelings of bloating in the abdomen. Passage of more gas than usual.  Walking can help get rid of the air that was put into your GI tract during the procedure and reduce the bloating. If you had a lower endoscopy (such as a colonoscopy or flexible sigmoidoscopy) you may notice spotting of blood in your stool or on the toilet paper. If you underwent a bowel prep for your procedure, you may not have a normal bowel movement for a few days.  Please Note:  You might notice some irritation and congestion in your nose or some drainage.  This is from the oxygen used during your procedure.  There is no need for concern and it should clear up in a day or so.  SYMPTOMS TO REPORT IMMEDIATELY:   Following lower endoscopy (colonoscopy or flexible sigmoidoscopy):  Excessive amounts of blood in the stool  Significant tenderness or worsening of abdominal pains  Swelling of the abdomen that is new, acute  Fever of 100F or higher   For urgent or emergent issues, a gastroenterologist can be reached at any hour by calling [phone].   DIET:  We do recommend a small meal at first, but then you may proceed to your regular diet.  Drink plenty of fluids but you should avoid alcoholic beverages for 24 hours.  MEDICATIONS: Resume present medications.  Please see handouts given to you by your recovery nurse.  ACTIVITY:  You should plan to take it easy for the rest of today and you should NOT  DRIVE or use heavy machinery until tomorrow (because of the sedation medicines used during the test).    FOLLOW UP: Our staff will call the number listed on your records 48-72 hours following your procedure to check on you and address any questions or concerns that you may have regarding the information given to you following your procedure. If we do not reach you, we will leave a message.  We will attempt to reach you two times.  During this call, we will ask if you have developed any symptoms of COVID 19. If you develop any symptoms (ie: fever, flu-like symptoms, shortness of breath, cough etc.) before then, please call [phone].  If you test positive for Covid 19 in the 2 weeks post procedure, please call and report this information to [place].    If any biopsies were taken you will be contacted by phone or by letter within the next 1-3 weeks.  Please call us at [phone] if you have not heard about the biopsies in 3 weeks.   Thank you for letting us provide for your healthcare needs today.   SIGNATURES/CONFIDENTIALITY: You and/or your care partner have signed paperwork which will be entered into your electronic medical record.  These signatures attest to the fact that that the information above on your After Visit Summary has been reviewed and is understood.  Full responsibility of the confidentiality of this discharge information lies with you and/or your  care-partner.

## 2019-02-21 NOTE — Op Note (Signed)
[place] Patient Name: Fernando Hess Procedure Date: 02/21/2019 7:18 AM MRN: [id number] Endoscopist: Docia Chuck. Henrene Pastor , MD Age: 73 Referring MD:  Date of Birth: [birth year] Gender: Male Account #: [id number] Procedure:                Colonoscopy with cold snare polypectomy x 3 Indications:              High risk colon cancer surveillance: Personal                            history of multiple (3 or more) adenomas. Previous                            examinations 2005, 2010, 2015 Medicines:                Monitored Anesthesia Care Procedure:                Pre-Anesthesia Assessment:                           - Prior to the procedure, a History and Physical                            was performed, and patient medications and                            allergies were reviewed. The patient's tolerance of                            previous anesthesia was also reviewed. The risks                            and benefits of the procedure and the sedation                            options and risks were discussed with the patient.                            All questions were answered, and informed consent                            was obtained. Prior Anticoagulants: The patient has                            taken no previous anticoagulant or antiplatelet                            agents. ASA Grade Assessment: II - A patient with                            mild systemic disease. After reviewing the risks                            and benefits, the patient was deemed in  satisfactory condition to undergo the procedure.                           After obtaining informed consent, the colonoscope                            was passed under direct vision. Throughout the                            procedure, the patient's blood pressure, pulse, and                            oxygen saturations were monitored continuously. The                            Model  CF-HQ190L [id number]) scope was introduced                            through the anus and advanced to the the cecum,                            identified by appendiceal orifice and ileocecal                            valve. The ileocecal valve, appendiceal orifice,                            and rectum were photographed. The quality of the                            bowel preparation was adequate to identify polyps.                            The colonoscopy was performed without difficulty.                            The patient tolerated the procedure well. The bowel                            preparation used was SUPREP via split dose                            instruction. Scope In: 7:41:16 AM Scope Out: 7:57:00 AM Scope Withdrawal Time: 0 hours 13 minutes 30 seconds  Total Procedure Duration: 0 hours 15 minutes 44 seconds  Findings:                 Three polyps were found in the ascending colon. The                            polyps were 2 to 3 mm in size. These polyps were                            removed with a cold snare. Resection and  retrieval                            were complete.                           A few diverticula were found in the left colon.                           The exam was otherwise without abnormality on                            direct and retroflexion views. Complications:            No immediate complications. Estimated blood loss:                            None. Estimated Blood Loss:     Estimated blood loss: none. Impression:               - Three 2 to 3 mm polyps in the ascending colon,                            removed with a cold snare. Resected and retrieved.                           - Diverticulosis in the left colon.                           - The examination was otherwise normal on direct                            and retroflexion views. Recommendation:           - Repeat colonoscopy in 5 years for surveillance.                            - Patient has a contact number available for                            emergencies. The signs and symptoms of potential                            delayed complications were discussed with the                            patient. Return to normal activities tomorrow.                            Written discharge instructions were provided to the                            patient.                           - Resume previous diet.                           -  Continue present medications.                           - Await pathology results. Docia Chuck. Henrene Pastor, MD 02/21/2019 8:06:14 AM This report has been signed electronically.

## 2019-02-23 ENCOUNTER — Encounter: Payer: Self-pay | Admitting: Internal Medicine

## 2019-02-23 ENCOUNTER — Telehealth: Payer: Self-pay

## 2019-02-23 NOTE — Telephone Encounter (Signed)
  Follow up Call-  Call back number 02/21/2019  Post procedure Call Back phone  # [phone]  Permission to leave phone message Yes  Some recent data might be hidden     Patient questions:  Do you have a fever, pain , or abdominal swelling? No. Pain Score  0 *  Have you tolerated food without any problems? Yes.    Have you been able to return to your normal activities? Yes.    Do you have any questions about your discharge instructions: Diet   No. Medications  No. Follow up visit  No.  Do you have questions or concerns about your Care? No.  Actions: * If pain score is 4 or above: No action needed, pain <4.   1. Have you developed a fever since your procedure? no  2.   Have you had an respiratory symptoms (SOB or cough) since your procedure? no  3.   Have you tested positive for COVID 19 since your procedure no  4.   Have you had any family members/close contacts diagnosed with the COVID 19 since your procedure?  no   If yes to any of these questions please route to Joylene John, RN and Alphonsa Gin, Therapist, sports.

## 2019-02-25 ENCOUNTER — Encounter: Payer: Self-pay | Admitting: Cardiovascular Disease

## 2019-02-25 ENCOUNTER — Ambulatory Visit (INDEPENDENT_AMBULATORY_CARE_PROVIDER_SITE_OTHER): Payer: Medicare Other | Admitting: Cardiovascular Disease

## 2019-02-25 ENCOUNTER — Other Ambulatory Visit: Payer: Self-pay

## 2019-02-25 DIAGNOSIS — I739 Peripheral vascular disease, unspecified: Secondary | ICD-10-CM | POA: Diagnosis not present

## 2019-02-25 MED ORDER — CILOSTAZOL 50 MG PO TABS: 50.0000 mg | ORAL_TABLET | Freq: Two times a day (BID) | ORAL | 3 refills | Status: DC

## 2019-02-25 NOTE — Assessment & Plan Note (Signed)
History of claudication right greater than left vascular risk factor status post bypass grafting with recent Dopplers revealing a right ABI of 1.5 and a left 9 medial calcinosis.  The Dopplers did reveal an occluded right posterior tibial and anterior tibial and an occluded left tibial.  Everything above that was fairly normal.  There is no evidence of critical limb ischemia.  I am going to get him on low-dose Pletal 50 mg p.o. twice daily and will reassess in 3 months.

## 2019-02-25 NOTE — Progress Notes (Signed)
02/25/2019 [name]   June 07, 1946  [id number]  Primary Physician [name], MD Primary Cardiologist: Lorretta Harp MD [name], [place]  HPI:  Fernando Hess is a 73 y.o. 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  He was referred by [name], [name] for peripheral vascular evaluation because of claudication.  He has a history of treated hypertension, hyperlipidemia and diabetes.  He had coronary artery bypass grafting 2017 by Dr. 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  He has been doing well otherwise since then.  Does complain of bilateral calf claudication.  Recent lower extremity arterial Doppler studies performed 02/13/2019 revealed normal ABIs although he did have medial calcinosis as well as tibial vessel disease.   Current Meds  Medication Sig  . aspirin 81 MG chewable tablet Chew 1 tablet (81 mg total) by mouth daily.  [name] atorvastatin (LIPITOR) 40 MG tablet Take 40 mg by mouth every morning.   . DULoxetine (CYMBALTA) 60 MG capsule Take 60 mg by mouth daily.   [name] glimepiride (AMARYL) 4 MG tablet Take 4 mg by mouth 2 (two) times daily.   . Insulin Glargine (BASAGLAR KWIKPEN) 100 UNIT/ML SOPN Inject 42 Units into the skin daily.   [name] linagliptin (TRADJENTA) 5 MG TABS tablet Take 5 mg by mouth daily.  . metoprolol succinate (TOPROL XL) 25 MG 24 hr tablet Take 0.5 tablets (12.5 mg total) by mouth daily.  . Multiple Vitamin (MULTIVITAMIN) tablet Take 1 tablet by mouth daily.  . nitroGLYCERIN (NITROSTAT) 0.4 MG SL tablet Place 0.4 mg under the tongue every 5 (five) minutes as needed for chest pain.  [name] ondansetron (ZOFRAN-ODT) 4 MG disintegrating tablet Take 4 mg by mouth every 8 (eight) hours as needed for nausea or vomiting.   . pioglitazone (ACTOS) 45 MG  tablet Take 45 mg by mouth daily.  . [DISCONTINUED] sildenafil (REVATIO) 20 MG tablet Take 20 mg by mouth daily as needed (for ED).    Current Facility-Administered Medications for the 02/25/19 encounter (Office Visit) with Lorretta Harp, MD  Medication  . 0.9 %  sodium chloride infusion     Allergies  Allergen Reactions  . Victoza [Liraglutide]     Severe fatigue & insomnia  . Levaquin [Levofloxacin] Itching and Rash  . Lisinopril Itching and Rash    Social History   Socioeconomic History  . Marital status: Married    Spouse name: Not on file  . Number of children: Not on file  . Years of education: Not on file  . Highest education level: Not on file  Occupational History  . Occupation: retired  Scientific laboratory technician  . Financial resource strain: Not on file  . Food insecurity    Worry: Not on file    Inability: Not on file  . Transportation needs    Medical: Not on file    Non-medical: Not on file  Tobacco Use  . Smoking status: Never Smoker  . Smokeless tobacco: Never Used  Substance and Sexual Activity  . Alcohol use: Not Currently  . Drug use: Never  . Sexual activity: Not on file  Lifestyle  . Physical activity    Days per week: Not on file    Minutes per session: Not on file  . Stress: Not on file  Relationships  .  Social Herbalist on phone: Not on file    Gets together: Not on file    Attends religious service: Not on file    Active member of club or organization: Not on file    Attends meetings of clubs or organizations: Not on file    Relationship status: Not on file  . Intimate partner violence    Fear of current or ex partner: Not on file    Emotionally abused: Not on file    Physically abused: Not on file    Forced sexual activity: Not on file  Other Topics Concern  . 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  Cardiovascular: negative for chest pain, dyspnea on exertion, edema, orthopnea, palpitations, paroxysmal nocturnal dyspnea or shortness of breath Dermatological: negative for rash Respiratory: negative for cough or wheezing Urologic: negative for hematuria Abdominal: negative for nausea, vomiting, diarrhea, bright red blood per rectum, melena, or hematemesis Neurologic: negative for visual changes, syncope, or dizziness All other systems reviewed and are otherwise negative except as noted above.    Blood pressure 128/80, pulse 71, temperature 98.4 F (36.9 C), height 5\' 6"  (1.676 m), weight 209 lb (94.8 kg).  General appearance: alert and no distress Neck: no adenopathy, no carotid bruit, no JVD, supple, symmetrical, trachea midline and thyroid not enlarged, symmetric, no tenderness/mass/nodules Lungs: clear to auscultation bilaterally Heart: regular rate and rhythm, S1, S2 normal, no murmur, click, rub or gallop Extremities: extremities normal, atraumatic, no cyanosis or edema Pulses: Decreased pedal pulses Skin: Skin color, texture, turgor normal. No rashes or lesions Neurologic: Alert and oriented X 3, normal strength and tone. Normal symmetric reflexes. Normal coordination and gait  EKG not performed today  ASSESSMENT AND PLAN:   Peripheral artery disease History of claudication right greater than left vascular risk factor status post bypass grafting with recent Dopplers revealing a right ABI of 1.5 and a left 9 medial calcinosis.  The Dopplers did reveal an occluded right posterior tibial and anterior tibial and an occluded left tibial.  Everything above that was fairly normal.  There is no evidence of critical limb ischemia.  I am going to get him on low-dose Pletal 50 mg p.o. twice daily and will reassess in 3 months.      Lorretta Harp MD FACP,FACC,FAHA, [facility] 02/25/2019 2:37 PM

## 2019-02-25 NOTE — Patient Instructions (Signed)
Medication Instructions:  Your physician has recommended you make the following change in your medication:   START CILOSTAZOL (PLETAL) 50 MG, ONE TABLET BY MOUTH TWICE A DAY   If you need a refill on your cardiac medications before your next appointment, please call your pharmacy.   Lab work: NONE If you have labs (blood work) drawn today and your tests are completely normal, you will receive your results only by: [name] MyChart Message (if you have MyChart) OR . A paper copy in the mail If you have any lab test that is abnormal or we need to change your treatment, we will call you to review the results.  Testing/Procedures: NONE  Follow-Up: At [facility], you and your health needs are our priority.  As part of our continuing mission to provide you with exceptional heart care, we have created designated Provider Care Teams.  These Care Teams include your primary Cardiologist (physician) and Advanced Practice Providers (APPs -  Physician Assistants and Nurse Practitioners) who all work together to provide you with the care you need, when you need it. You will need a follow up appointment in 3 months WITH DR. Gwenlyn Found.  Please call our office 2 months in advance to schedule this appointment.

## 2019-03-01 ENCOUNTER — Telehealth: Payer: Self-pay | Admitting: Cardiovascular Disease

## 2019-03-01 NOTE — Telephone Encounter (Signed)
Peripheral edema is 1 of the side effects of cilostazol.  If this coincides with starting this drug I would probably discontinue it

## 2019-03-01 NOTE — Telephone Encounter (Signed)
New Message     Pt c/o medication issue:  1. Name of Medication: Cilostazol   2. How are you currently taking this medication (dosage and times per day)? 50 mg 2x daily   3. Are you having a reaction (difficulty breathing--STAT)? No   4. What is your medication issue? Pt is having swelling in his feet and ankles. She says he has been sleeping a lot    Please call

## 2019-03-01 NOTE — Telephone Encounter (Signed)
Spoke with pt wife, the patient started the cilostazol on Friday and this morning he woke with swelling in his feet. He is lying in bed with his feet above his heart and he denies SOB. The swelling has gotten some better and he took one of his wives fluid pills. He does admit to diet indiscretion because he is on vacation. They are wondering if the new medication could be causing his feet to swell. Will forward to dr berry to review and advise

## 2019-03-01 NOTE — Telephone Encounter (Signed)
The patient's wife called again regarding this issue and I called her back.  They had been traveling, he was sitting with his feet down more than usual and they were eating out.  He had started taking the Pletal 2 days before they left.  He has never had a problem with pedal edema before, even when they travel.  Advised them to hold the Pletal for now.  When they come back from the trip, if his feet have gotten better, restart the Pletal.  If he gets recurrent symptoms after restarting the Pletal, they should let us know.   Rosaria Ferries, [name] 03/01/2019 7:43 PM Beeper [phone]

## 2019-03-03 ENCOUNTER — Telehealth: Payer: Self-pay

## 2019-03-03 NOTE — Telephone Encounter (Signed)
Unable to get in contact with the patient to convert their office appt with Amy on 03/07/2019 into a mychart video visit. I was unable to leave a voicemail asking the patient to return my call. I will attempt to call back later today.   If patient calls back please convert their office visit into a mychart video visit.

## 2019-03-07 ENCOUNTER — Ambulatory Visit (INDEPENDENT_AMBULATORY_CARE_PROVIDER_SITE_OTHER): Payer: Medicare Other | Admitting: Family Medicine

## 2019-03-07 ENCOUNTER — Encounter: Payer: Self-pay | Admitting: Family Medicine

## 2019-03-07 ENCOUNTER — Other Ambulatory Visit: Payer: Self-pay

## 2019-03-07 DIAGNOSIS — Z9989 Dependence on other enabling machines and devices: Secondary | ICD-10-CM

## 2019-03-07 DIAGNOSIS — G4733 Obstructive sleep apnea (adult) (pediatric): Secondary | ICD-10-CM | POA: Insufficient documentation

## 2019-03-07 NOTE — Progress Notes (Signed)
PATIENT: Fernando Hess DOB: December 07, 1945  REASON FOR VISIT: follow up HISTORY FROM: patient  Virtual Visit via Telephone Note  I connected with [name] on 03/07/19 at  8:30 AM EDT by telephone and verified that I am speaking with the correct person using two identifiers.   I discussed the limitations, risks, security and privacy concerns of performing an evaluation and management service by telephone and the availability of in person appointments. I also discussed with the patient that there may be a patient responsible charge related to this service. The patient expressed understanding and agreed to proceed.   History of Present Illness:  03/07/19 Fernando Hess is a 73 y.o. male here today for follow up of OSA on CPAP. He does note less headaches since starting CPAP. He may have 1 headache a month now. He is tolerating therapy well and without concerns. Baseline AHI 37.9/h and supine 58.5/h.   02/05/2019 - 03/06/2019 20 Usage days 29/30 days (97%) >= 4 hours 26 days (87%) < 4 hours 3 days (10%) Usage hours 172 hours 43 minutes Average usage (total days) 5 hours 45 minutes Average usage (days used) 5 hours 57 minutes Median usage (days used) 5 hours 50 minutes Total used hours (value since last reset - 03/06/2019) 352 hours AirSense 10 AutoSet Serial number [id number] Mode AutoSet Min Pressure 5 cmH2O Max Pressure 15 cmH2O EPR Fulltime EPR level 3 Response Soft Therapy Pressure - cmH2O Median: 8.1 95th percentile: 12.8 Maximum: 14.1 Leaks - L/min Median: 2.4 95th percentile: 5.5 Maximum: 23.8 Events per hour AI: 6.8 HI: 2.0 AHI: 8.8 Apnea Index Central: 1.9 Obstructive: 4.8 Unknown: 0.1 RERA Index 0.5 Cheyne-Stokes respiration (average duration per night) 13 minutes (4%)   History (copied from Dr Edwena Felty note on 11/11/2018)  HPI:  Fernando Hess is a 73 y.o. male patient, seen here as in a referral from Dr. Jaynee Eagles for sleep apnea evaluation in relation to  sleep.   He reports headaches any time of the day, and his ophthalmologist Dr Deloria Lair, MD suggested the PSG test. He suspected hypoxemia contributing to abnormal vascular congestion. Patient has been physically inactive since  Bypass surgery.    Sleep habits are as follows: the couple sleeps in separate rooms, she snores, he doesn't think he does. She says he does.  5 PM is supper time, and bedtime is 10 PM, but he sometimes is in be as early as 7.  The patient usually watches TV during the evening hours.  He describes his bedroom as not entirely cool, but quiet and dark.  He mainly sleeps prone, without any pillows. Sleep duration varies, his sleep is mainly fragmented by nocturia. He estimates that he can go to the bathroom every 2 hours.  The patient reports that he wakes up because he has to go to the bathroom.  He has a dry mouth when he wakes up. He has difficulties going back to sleep.  Sometimes he goes back to the den and watches tv for hours and has a snack.  Racing thoughts.   He rises at 8.30 AM - goes back to bed after breakfast. Same after lunch- naps last 2-3 hours (!).  He sleeps to escape his headaches.  Estimated sleep time up to 10 hours.    Sleep medical history : never had a sleep study. Headaches. CKD grade 4 . 2 surgeries for renal stones 2019. DM 2 - 1999, now insulin for the last 6 month.  diabetic retinopathy,  nephropathy, bradycardia.   Family sleep history: 2 sons with OSA.    Social history: married, retired, 2 adult sons. Non smoker, seldomly ETOH,  Caffeine : he drinks coke, 1  Coffee/ day  and chocolate, seldom iced tea - when going out.    Observations/Objective:  Generalized: Well developed, in no acute distress  Mentation: Alert oriented to time, place, history taking. Follows all commands speech and language fluent   Assessment and Plan:  73 y.o. year old male  has a past medical history of Atrial premature complexes, CAD (coronary  artery disease), native coronary artery (cardiologist-- dr Liane Comber), CKD (chronic kidney disease), stage III ([place]), Erectile dysfunction, Esophageal reflux, History of atrial fibrillation, History of kidney stones, History of non-ST elevation myocardial infarction (NSTEMI) (09/24/2015), History of ST elevation myocardial infarction (STEMI) (10/22/2015), Hyperlipidemia, Hypertension, Kidney stone, Left ureteral stone, Mild atherosclerosis of both carotid arteries, Nephrolithiasis, Peripheral artery disease, RBBB (right bundle branch block), Renal atrophy, right, Sleep apnea, ST elevation myocardial infarction (STEMI) of inferior wall ([place]) (10/22/2015), Type 2 diabetes mellitus treated with insulin ([place]), and Wears glasses. here with    ICD-10-CM   1. OSA on CPAP  G47.33 For home use only DME continuous positive airway pressure (CPAP)   Z99.89    [name] does note benefit from using CPAP therapy.  He endorses less headaches after starting therapy.  Compliance download shows optimal compliance.  Unfortunately, his AHI remains elevated at greater than 8.  Baseline was 37.9-58/hr. after reviewing his compliance report, we will increase maximum pressure to 16 cm of water.  All other settings will remain the same.  Patient was encouraged to continue nightly use of CPAP and for greater than 4 hours each night.  I have scheduled him for a 75-month follow-up for review and to ensure AHI is less than 5.  Mr. josephmichael lisenbee understanding and agreement with plan.  Orders Placed This Encounter  Procedures  . For home use only DME continuous positive airway pressure (CPAP)    Please change auto titration pressure settings to [place] with EPR 3.    Order Specific Question:   Length of Need    Answer:   Lifetime    Order Specific Question:   Patient has OSA or probable OSA    Answer:   Yes    Order Specific Question:   Settings    Answer:   Other see comments    Order Specific Question:   CPAP  supplies needed    Answer:   Mask, headgear, cushions, filters, heated tubing and water chamber    No orders of the defined types were placed in this encounter.    Follow Up Instructions:  I discussed the assessment and treatment plan with the patient. The patient was provided an opportunity to ask questions and all were answered. The patient agreed with the plan and demonstrated an understanding of the instructions.   The patient was advised to call back or seek an in-person evaluation if the symptoms worsen or if the condition fails to improve as anticipated.  I provided 25 minutes of non-face-to-face time during this encounter.  Patient is at his place of residence during teleconference.  Video conferencing unavailable due to lack of technology.  Provider is located at her place of residence.  Maryelizabeth Kaufmann, CMA helped to facilitate visit.   Debbora Presto, NP

## 2019-03-17 ENCOUNTER — Other Ambulatory Visit: Payer: Self-pay | Admitting: Cardiology

## 2019-03-17 DIAGNOSIS — R001 Bradycardia, unspecified: Secondary | ICD-10-CM

## 2019-03-17 DIAGNOSIS — I25810 Atherosclerosis of coronary artery bypass graft(s) without angina pectoris: Secondary | ICD-10-CM

## 2019-03-17 DIAGNOSIS — I10 Essential (primary) hypertension: Secondary | ICD-10-CM

## 2019-03-17 DIAGNOSIS — R296 Repeated falls: Secondary | ICD-10-CM

## 2019-03-17 DIAGNOSIS — E785 Hyperlipidemia, unspecified: Secondary | ICD-10-CM

## 2019-03-29 ENCOUNTER — Telehealth: Payer: Self-pay | Admitting: Cardiovascular Disease

## 2019-03-29 NOTE — Telephone Encounter (Signed)
Called patient, advised of message from PharmD, patient verbalized understanding.

## 2019-03-29 NOTE — Telephone Encounter (Signed)
No indication that it can cause muscle cramping.  He can cut the tablets in half and take it that way.  There aren't any alternative meds for this.

## 2019-03-29 NOTE — Telephone Encounter (Signed)
Called patient back- he states he is unable to swallow the pill and wants to know if it can cause muscle cramps as he is having them.  Please advise. Thank you !

## 2019-03-29 NOTE — Telephone Encounter (Signed)
New message   Pt c/o medication issue:  1. Name of Medication: cilostazol (PLETAL) 50 MG tablet  2. How are you currently taking this medication (dosage and times per day)? Twice daily  3. Are you having a reaction (difficulty breathing--STAT)? No   4. What is your medication issue? Patient's wife wants to know if this medication could be causing the muscle cramps and the patient can't swallow these pills. Please call to discuss.

## 2019-04-05 ENCOUNTER — Telehealth: Payer: Self-pay | Admitting: Cardiovascular Disease

## 2019-04-05 NOTE — Telephone Encounter (Signed)
   Spoke to pt's wife who report pt has been having trouble walking. She state if pt walks 20-30 mins he has c/o of muscle cramps in his legs. She report she can feel a hardness in some of the areas. She report they have tried heating pad and muscle cream but the only thing that relieves the pain is tylenol and not walking. Wife requesting that pt be seen earlier than scheduled appointment in Sept. Appointment scheduled for tomorrow 7/22 at 330 with Dr. Gwenlyn Found. Wife updated with visitor's policy but report pt has trouble remembering things and would like to be present for appointment. Wife also advised for them to wear a mask.   COVID-19 Pre-Screening Questions:  . In the past 7 to 10 days have you had a cough,  shortness of breath, headache, congestion, fever (100 or greater) body aches, chills, sore throat, or sudden loss of taste or sense of smell? NO . Have you been around anyone with known Covid 19. No . Have you been around anyone who is awaiting Covid 19 test results in the past 7 to 10 days?No . Have you been around anyone who has been exposed to Covid 19, or has mentioned symptoms of Covid 19 within the past 7 to 10 days?No  If you have any concerns/questions about symptoms patients report during screening (either on the phone or at threshold). Contact the provider seeing the patient or DOD for further guidance.  If neither are available contact a member of the leadership team.

## 2019-04-05 NOTE — Telephone Encounter (Signed)
New Message    Patient's wife thinks the patient should come in earlier than 05/31/19 for appt and would like a nurse to call her back to discuss.

## 2019-04-06 ENCOUNTER — Other Ambulatory Visit: Payer: Self-pay

## 2019-04-06 ENCOUNTER — Ambulatory Visit (INDEPENDENT_AMBULATORY_CARE_PROVIDER_SITE_OTHER): Payer: Medicare Other | Admitting: Cardiovascular Disease

## 2019-04-06 ENCOUNTER — Encounter: Payer: Self-pay | Admitting: Cardiovascular Disease

## 2019-04-06 ENCOUNTER — Encounter (INDEPENDENT_AMBULATORY_CARE_PROVIDER_SITE_OTHER): Payer: Self-pay

## 2019-04-06 VITALS — BP 122/70 | HR 80 | Temp 97.9°F | Ht 66.0 in | Wt 216.0 lb

## 2019-04-06 DIAGNOSIS — I10 Essential (primary) hypertension: Secondary | ICD-10-CM

## 2019-04-06 DIAGNOSIS — I739 Peripheral vascular disease, unspecified: Secondary | ICD-10-CM

## 2019-04-06 NOTE — Assessment & Plan Note (Signed)
History of peripheral arterial disease with duplex ultrasound performed 02/11/2019 revealing tibial disease on both sides without large vessel involvement.  He is diabetic.  I began him on Pletal 50 mg p.o. twice daily which resulted in some improvement in his claudication symptoms.

## 2019-04-06 NOTE — Patient Instructions (Signed)
Medication Instructions:  Your physician recommends that you continue on your current medications as directed. Please refer to the Current Medication list given to you today.  If you need a refill on your cardiac medications before your next appointment, please call your pharmacy.   Lab work: NONE If you have labs (blood work) drawn today and your tests are completely normal, you will receive your results only by: . MyChart Message (if you have MyChart) OR . A paper copy in the mail If you have any lab test that is abnormal or we need to change your treatment, we will call you to review the results.  Testing/Procedures: NONE  Follow-Up: At [facility], you and your health needs are our priority.  As part of our continuing mission to provide you with exceptional heart care, we have created designated Provider Care Teams.  These Care Teams include your primary Cardiologist (physician) and Advanced Practice Providers (APPs -  Physician Assistants and Nurse Practitioners) who all work together to provide you with the care you need, when you need it. You will need a follow up appointment in 12 months WITH DR. BERRY.  Please call our office 2 months in advance to schedule this appointment.   

## 2019-04-06 NOTE — Progress Notes (Signed)
04/06/2019 Arnette Norris Salway   1946/06/23  [id number]  Primary Physician [name], MD Primary Cardiologist: Lorretta Harp MD [name], [place]  HPI:  Fernando Hess is a 73 y.o.  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  He was referred by [name], [name] for peripheral vascular evaluation because of claudication.  I last saw him in the office 02/25/2019. He has a history of treated hypertension, hyperlipidemia and diabetes.  He had coronary artery bypass grafting 2017 by Dr. 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  He has been doing well otherwise since then.  Does complain of bilateral calf claudication.  Recent lower extremity arterial Doppler studies performed 02/13/2019 revealed normal ABIs although he did have medial calcinosis as well as tibial vessel disease.  Lower extremity Dopplers performed 02/11/2019 revealed tibial vessel disease primarily.  I did begin him on Pletal 50 mg p.o. twice daily which resulted in some improvement in his claudication symptoms.   Current Meds  Medication Sig  . aspirin 81 MG chewable tablet Chew 1 tablet (81 mg total) by mouth daily.  [name] atorvastatin (LIPITOR) 40 MG tablet Take 40 mg by mouth every morning.   . cilostazol (PLETAL) 50 MG tablet Take 1 tablet (50 mg total) by mouth 2 (two) times daily.  . DULoxetine (CYMBALTA) 60 MG capsule Take 60 mg by mouth daily.   [name] glimepiride (AMARYL) 4 MG tablet Take 4 mg by mouth 2 (two) times daily.   . Insulin Glargine (BASAGLAR KWIKPEN) 100 UNIT/ML SOPN Inject 42 Units into the skin daily.   [name] linagliptin (TRADJENTA) 5 MG TABS tablet Take 5 mg by mouth daily.  . metoprolol succinate (TOPROL-XL) 25 MG 24 hr tablet TAKE 1/2 TABLET BY MOUTH EVERY DAY  . Multiple Vitamin (MULTIVITAMIN)  tablet Take 1 tablet by mouth daily.  . nitroGLYCERIN (NITROSTAT) 0.4 MG SL tablet Place 0.4 mg under the tongue every 5 (five) minutes as needed for chest pain.  [name] ondansetron (ZOFRAN-ODT) 4 MG disintegrating tablet Take 4 mg by mouth every 8 (eight) hours as needed for nausea or vomiting.   . pioglitazone (ACTOS) 45 MG tablet Take 45 mg by mouth daily.   Current Facility-Administered Medications for the 04/06/19 encounter (Office Visit) with Lorretta Harp, MD  Medication  . 0.9 %  sodium chloride infusion     Allergies  Allergen Reactions  . Victoza [Liraglutide]     Severe fatigue & insomnia  . Levaquin [Levofloxacin] Itching and Rash  . Lisinopril Itching and Rash    Social History   Socioeconomic History  . Marital status: Married    Spouse name: Not on file  . Number of children: Not on file  . Years of education: Not on file  . Highest education level: Not on file  Occupational History  . Occupation: retired  Scientific laboratory technician  . Financial resource strain: Not on file  . Food insecurity    Worry: Not on file    Inability: Not on file  . Transportation needs    Medical: Not on file    Non-medical: Not on file  Tobacco Use  . Smoking status: Never Smoker  . Smokeless tobacco: Never Used  Substance and Sexual Activity  . Alcohol use: Not Currently  . Drug use: Never  .  Sexual activity: Not on file  Lifestyle  . Physical activity    Days per week: Not on file    Minutes per session: Not on file  . Stress: Not on file  Relationships  . Social Herbalist on phone: Not on file    Gets together: Not on file    Attends religious service: Not on file    Active member of club or organization: Not on file    Attends meetings of clubs or organizations: Not on file    Relationship status: Not on file  . Intimate partner violence    Fear of current or ex partner: Not on file    Emotionally abused: Not on file    Physically abused: Not on file    Forced  sexual activity: Not on file  Other Topics Concern  . 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  Cardiovascular: negative for chest pain, dyspnea on exertion, edema, orthopnea, palpitations, paroxysmal nocturnal dyspnea or shortness of breath Dermatological: negative for rash Respiratory: negative for cough or wheezing Urologic: negative for hematuria Abdominal: negative for nausea, vomiting, diarrhea, bright red blood per rectum, melena, or hematemesis Neurologic: negative for visual changes, syncope, or dizziness All other systems reviewed and are otherwise negative except as noted above.    Blood pressure 122/70, pulse 80, temperature 97.9 F (36.6 C), height 5\' 6"  (1.676 m), weight 216 lb (98 kg).  General appearance: alert and no distress Neck: no adenopathy, no carotid bruit, no JVD, supple, symmetrical, trachea midline and thyroid not enlarged, symmetric, no tenderness/mass/nodules Lungs: clear to auscultation bilaterally Heart: regular rate and rhythm, S1, S2 normal, no murmur, click, rub or gallop Extremities: extremities normal, atraumatic, no cyanosis or edema Pulses: Diminished pedal pulses bilaterally Skin: Skin color, texture, turgor normal. No rashes or lesions Neurologic: Alert and oriented X 3, normal strength and tone. Normal symmetric reflexes. Normal coordination and gait  EKG sinus rhythm at 80 with right bundle branch block.  I personally reviewed this EKG.  ASSESSMENT AND PLAN:   Peripheral artery disease History of peripheral arterial disease with duplex ultrasound performed 02/11/2019 revealing tibial disease on both sides without large vessel involvement.  He is diabetic.  I began him on Pletal 50 mg p.o. twice daily which resulted in some improvement in his claudication symptoms.      Lorretta Harp MD FACP,FACC,FAHA, [facility] 04/06/2019 3:57 PM

## 2019-05-20 ENCOUNTER — Other Ambulatory Visit: Payer: Self-pay

## 2019-05-20 ENCOUNTER — Encounter: Payer: Self-pay | Admitting: Dietician

## 2019-05-20 ENCOUNTER — Ambulatory Visit (INDEPENDENT_AMBULATORY_CARE_PROVIDER_SITE_OTHER): Payer: Medicare Other | Admitting: Internal Medicine

## 2019-05-20 ENCOUNTER — Encounter: Payer: Medicare Other | Attending: Family Medicine | Admitting: Dietician

## 2019-05-20 ENCOUNTER — Encounter: Payer: Self-pay | Admitting: Internal Medicine

## 2019-05-20 VITALS — BP 144/70 | HR 92 | Temp 98.5°F | Ht 64.0 in | Wt 220.5 lb

## 2019-05-20 DIAGNOSIS — N184 Chronic kidney disease, stage 4 (severe): Secondary | ICD-10-CM | POA: Insufficient documentation

## 2019-05-20 DIAGNOSIS — R1310 Dysphagia, unspecified: Secondary | ICD-10-CM

## 2019-05-20 DIAGNOSIS — E119 Type 2 diabetes mellitus without complications: Secondary | ICD-10-CM | POA: Insufficient documentation

## 2019-05-20 DIAGNOSIS — I2510 Atherosclerotic heart disease of native coronary artery without angina pectoris: Secondary | ICD-10-CM | POA: Diagnosis not present

## 2019-05-20 NOTE — Patient Instructions (Signed)
You have been scheduled for an endoscopy. Please follow written instructions given to you at your visit today. If you use inhalers (even only as needed), please bring them with you on the day of your procedure.   

## 2019-05-20 NOTE — Progress Notes (Signed)
HISTORY OF PRESENT ILLNESS:  Fernando Hess is a 73 y.o. male with past medical history as listed below including coronary artery disease and diabetes mellitus.  He presents today with a new complaint of dysphasia.  He states this has been going on for 2 or 3 months.  Mostly the pills.  He points to the cervical esophagus.  Sometimes will regurgitate his pills.  Does not seem to have much difficulty with food or beverage.  No weight loss.  No reflux symptoms.  He did undergo colonoscopy February 21, 2023 history of adenomatous colon polyps.  He was found to have adenomatous polyps and diverticulosis with follow-up in 5 years recommended.  REVIEW OF SYSTEMS:  All non-GI ROS negative as otherwise stated in the HPI except for cough, depression, headaches, sleeping problems, irregular heartbeats, excessive urination, muscle cramps  Past Medical History:  Diagnosis Date  . Atrial premature complexes   . CAD (coronary artery disease), native coronary artery cardiologist-- dr Raliegh Ip. nelson   hx NSTEMI 09-24-2015  s/p  CABG x5 on 10-01-2017;  post op STEMI inferolateral wall,  SVG OM1 and SVG OM2 occluded, distal OM occlusion the calpruit, treated medically  . CKD (chronic kidney disease), stage III ([place])   . Erectile dysfunction   . Esophageal reflux   . History of atrial fibrillation    post op CABG 10-02-2015  . History of kidney stones   . History of non-ST elevation myocardial infarction (NSTEMI) 09/24/2015   s/p  CABG x5  . History of ST elevation myocardial infarction (STEMI) 10/22/2015   inferior wall,  post op CABG 10-02-2015  . Hyperlipidemia   . Hypertension   . Kidney stone    h/o  . Left ureteral stone   . Mild atherosclerosis of both carotid arteries   . Nephrolithiasis    per CT bilateral non-obstructive calculi  . Peripheral artery disease    LE Arterial US 01/2019: R PTA and ATA occluded; L ATA occluded  . RBBB (right bundle branch block)   . Renal atrophy, right   . Sleep apnea    wears cpap   . ST elevation myocardial infarction (STEMI) of inferior wall ([place]) 10/22/2015  . Type 2 diabetes mellitus treated with insulin ([place])    followed by pcp  . Wears glasses     Past Surgical History:  Procedure Laterality Date  . APPENDECTOMY  1965  . CARDIAC CATHETERIZATION N/A 09/26/2015   Procedure: Left Heart Cath and Coronary Angiography;  Surgeon: Troy Sine, MD;  Location: [place] [facility];  Service: Cardiovascular;  Laterality: N/A;  . CARDIAC CATHETERIZATION N/A 10/22/2015   Procedure: Left Heart Cath and Coronary Angiography;  Surgeon: Sherren Mocha, MD;  Location: [place] [facility];  Service: Cardiovascular;  Laterality: N/A;  . CATARACT EXTRACTION W/ INTRAOCULAR LENS  IMPLANT, BILATERAL  2017  . COLONOSCOPY    . CORONARY ARTERY BYPASS GRAFT N/A 10/02/2015   Procedure: CORONARY ARTERY BYPASS GRAFTING (CABG) X5 LIMA-LAD; SVG-DIAG; SVG-OM; SVG-PD; SVG-RAMUS TRANSESOPHAGEAL ECHOCARDIOGRAM (TEE) ENDOSCOPIC GREATER SAPHENOUS VEIN HARVEST BILAT LE;  Surgeon: Ivin Poot, MD;  Location: [place];  Service: Open Heart Surgery;  Laterality: N/A;  . CYSTOSCOPY/URETEROSCOPY/HOLMIUM LASER/STENT PLACEMENT Left 08/10/2018   Procedure: CYSTOSCOPY/URETEROSCOPY/HOLMIUM LASER/STENT PLACEMENT;  Surgeon: Festus Aloe, MD;  Location: [facility];  Service: Urology;  Laterality: Left;  . CYSTOSCOPY/URETEROSCOPY/HOLMIUM LASER/STENT PLACEMENT Left 09/10/2018   Procedure: CYSTOSCOPY/URETEROSCOPY/HOLMIUM LASER/STENT EXCHANGE;  Surgeon: Festus Aloe, MD;  Location: [facility];  Service: Urology;  Laterality:  Left;  . LEFT HEART CATHETERIZATION WITH CORONARY ANGIOGRAM N/A 04/13/2014   Procedure: LEFT HEART CATHETERIZATION WITH CORONARY ANGIOGRAM;  Surgeon: Jacolyn Reedy, MD;  Location: [facility];  Service: Cardiovascular;  Laterality: N/A;  . LEG SURGERY Right age 78   closed reduction leg fracture  . POLYPECTOMY    . TEE WITHOUT CARDIOVERSION N/A 10/02/2015    Procedure: TRANSESOPHAGEAL ECHOCARDIOGRAM (TEE);  Surgeon: Ivin Poot, MD;  Location: [place];  Service: Open Heart Surgery;  Laterality: N/A;  . URETEROSCOPY WITH HOLMIUM LASER LITHOTRIPSY Bilateral 2004;  2005  dr Risa Grill  @[facility]   . VASECTOMY      Social History DOYEL MULKERN  reports that he has never smoked. He has never used smokeless tobacco. He reports previous alcohol use. He reports that he does not use drugs.  family history includes Diabetes in his brother and brother; Heart attack in his father and mother; Pancreatic cancer in his brother.  Allergies  Allergen Reactions  . Victoza [Liraglutide]     Severe fatigue & insomnia  . Levaquin [Levofloxacin] Itching and Rash  . Lisinopril Itching and Rash       PHYSICAL EXAMINATION: Vital signs: BP (!) 144/70 (BP Location: Left Arm, Patient Position: Sitting, Cuff Size: Normal)   Pulse 92   Temp 98.5 F (36.9 C)   Ht 5\' 4"  (1.626 m) Comment: height measured without shoes  Wt 220 lb 8 oz (100 kg)   BMI 37.85 kg/m   Constitutional: generally well-appearing, no acute distress Psychiatric: alert and oriented x3, cooperative Eyes: extraocular movements intact, anicteric, conjunctiva pink Mouth: oral pharynx moist, no lesions Neck: supple no lymphadenopathy Cardiovascular: heart regular rate and rhythm, no murmur Lungs: clear to auscultation bilaterally Abdomen: soft, obese, nontender, nondistended, no obvious ascites, no peritoneal signs, normal bowel sounds, no organomegaly Rectal: Omitted Extremities: no clubbing, cyanosis, or lower extremity edema bilaterally Skin: no lesions on visible extremities Neuro: No focal deficits.  Cranial nerves intact  ASSESSMENT:  1.  Intermittent dysphasia to pills.  Rule out stricture 2.  History of adenomatous colon polyps.  Surveillance up-to-date. 3.  Multiple medical problems.  Stable  PLAN:  1.  Schedule EGD in the [place].The nature of the procedure, as well as the risks,  benefits, and alternatives were carefully and thoroughly reviewed with the patient. Ample time for discussion and questions allowed. The patient understood, was satisfied, and agreed to proceed. 2.  Hold diabetic medications the day of the procedure to avoid unwanted hypoglycemia 3.  Surveillance colonoscopy in 5 years

## 2019-05-20 NOTE — Progress Notes (Signed)
Diabetes Self-Management Education  Visit Type: First/Initial  Appt. Start Time: 0915 Appt. End Time: 0973  05/23/2019  [name], identified by name and date of birth, is a 73 y.o. male with a diagnosis of Diabetes: Type 2.   ASSESSMENT Patient was here today with his wife.  Patient was referred for type 2 diabetes and CKD.  History includes Type 2 Diabetes since 1999, CABG x5 (2017), CKD stage 3, GERD, iron deficiency anemia, depression, pure cholesterolemia on C-pap.   He reports that he is having difficulty swallowing and is to see his gastroenterologist today.  Labs include:  A1C 9% decreased from 11.5%, cholesterol 144, HDL 46, LDL 79, Triglycerides 94 04/15/2019.  eGFR 36, BUN 32, Creatinine 1.82 08/2018. Medications include glimipuride, Basaglar 50 units q am Tradjenta, Actose Fasting BG 118 and has been low 2-3 times this past month. He walks 1 mile each day which takes about 45 minutes.  Weight hx:   Approximately 205 lbs today per patient. Weight was down to 145 lbs 3 years ago prior to open heart surgery.  He was told to eat whatever he wished and gained increased amounts.  Patient lives with his wife.  They share shopping and she does the cooking. 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  We will discuss meal planning more at next visit.  Height '5\' 6"'  (1.676 m), weight 205 lb (93 kg). Body mass index is 33.09 kg/m.  Diabetes Self-Management Education - 05/20/19 0944      Visit Information   Visit Type  First/Initial      Initial Visit   Diabetes Type  Type 2    Are you currently following a meal plan?  No    Are you taking your medications as prescribed?  Yes    Date Diagnosed  1999      Health Coping   How would you rate your overall health?  Fair      Psychosocial Assessment   Patient Belief/Attitude about Diabetes  Motivated to manage diabetes    Self-care barriers  None    Self-management support  Doctor's  office;Family    Other persons present  Patient;Spouse/SO    Patient Concerns  Nutrition/Meal planning    Special Needs  None    Preferred Learning Style  No preference indicated    Learning Readiness  Ready    How often do you need to have someone help you when you read instructions, pamphlets, or other written materials from your doctor or pharmacy?  1 - Never    What is the last grade level you completed in school?  12th grade      Pre-Education Assessment   Patient understands the diabetes disease and treatment process.  Needs Review    Patient understands incorporating nutritional management into lifestyle.  Needs Review    Patient undertands incorporating physical activity into lifestyle.  Needs Review    Patient understands using medications safely.  Needs Review    Patient understands monitoring blood glucose, interpreting and using results  Needs Review    Patient understands prevention, detection, and treatment of acute complications.  Needs Review    Patient understands prevention, detection, and treatment of chronic complications.  Needs Review    Patient understands how to develop strategies to address psychosocial issues.  Needs Review    Patient understands how to develop strategies to promote health/change behavior.  Needs Review      Complications   Last  HgB A1C per patient/outside source  9 %   04/15/2019   How often do you check your blood sugar?  1-2 times/day    Fasting Blood glucose range (mg/dL)  <70;70-129    Number of hypoglycemic episodes per month  2    Can you tell when your blood sugar is low?  Yes    What do you do if your blood sugar is low?  PB and crackers or 1/2 package cookies and milk    Number of hyperglycemic episodes per week  1    Have you had a dilated eye exam in the past 12 months?  Yes    Have you had a dental exam in the past 12 months?  Yes    Are you checking your feet?  Yes    How many days per week are you checking your feet?  7       Dietary Intake   Breakfast  egg, bacon, white toast, jelly, 2% milk   8:30   Snack (morning)  NABS    Lunch  frozen dinner OR salad, ham    Snack (afternoon)  NABS    Dinner  frozen dinner OR soup and sandwich    Snack (evening)  cookies    Beverage(s)  regular coke or sprite, whole milk, water      Exercise   Exercise Type  Light (walking / raking leaves)    How many days per week to you exercise?  7    How many minutes per day do you exercise?  40    Total minutes per week of exercise  280      Patient Education   Previous Diabetes Education  Yes (please comment)   [name] 2 years ago   Disease state   Definition of diabetes, type 1 and 2, and the diagnosis of diabetes    Nutrition management   Role of diet in the treatment of diabetes and the relationship between the three main macronutrients and blood glucose level    Physical activity and exercise   Role of exercise on diabetes management, blood pressure control and cardiac health.    Medications  Reviewed patients medication for diabetes, action, purpose, timing of dose and side effects.    Monitoring  Taught/discussed recording of test results and interpretation of SMBG.;Identified appropriate SMBG and/or A1C goals.;Daily foot exams;Yearly dilated eye exam    Acute complications  Taught treatment of hypoglycemia - the 15 rule.    Chronic complications  Relationship between chronic complications and blood glucose control;Identified and discussed with patient  current chronic complications    Psychosocial adjustment  Worked with patient to identify barriers to care and solutions;Role of stress on diabetes    Personal strategies to promote health  Lifestyle issues that need to be addressed for better diabetes care      Individualized Goals (developed by patient)   Nutrition  General guidelines for healthy choices and portions discussed    Physical Activity  Exercise 5-7 days per week;45 minutes per day    Medications  take my  medication as prescribed    Monitoring   test my blood glucose as discussed    Reducing Risk  examine blood glucose patterns;treat hypoglycemia with 15 grams of carbs if blood glucose less than 23m/dL    Health Coping  discuss diabetes with (comment)   MD, RD, CDE     Post-Education Assessment   Patient understands the diabetes disease and treatment process.  Demonstrates understanding /  competency    Patient understands incorporating nutritional management into lifestyle.  Needs Review    Patient undertands incorporating physical activity into lifestyle.  Demonstrates understanding / competency    Patient understands using medications safely.  Demonstrates understanding / competency    Patient understands monitoring blood glucose, interpreting and using results  Demonstrates understanding / competency    Patient understands prevention, detection, and treatment of acute complications.  Demonstrates understanding / competency    Patient understands prevention, detection, and treatment of chronic complications.  Demonstrates understanding / competency    Patient understands how to develop strategies to address psychosocial issues.  Needs Review    Patient understands how to develop strategies to promote health/change behavior.  Needs Review      Outcomes   Expected Outcomes  Demonstrated interest in learning. Expect positive outcomes    Future DMSE  2 months    Program Status  Not Completed       Individualized Plan for Diabetes Self-Management Training:   Learning Objective:  Patient will have a greater understanding of diabetes self-management. Patient education plan is to attend individual and/or group sessions per assessed needs and concerns.   Plan:   Patient Instructions  Continue to stay active and walk daily. Rethink what you are drinking.  Aim for more water. Avoid any foods/beverages with Phos... Limit your sodium intake.  Avoid added salt, processed meat. Choose baked or  grilled rather than fried.  Continue to check your blood sugar 1-2 times daily. Continue to take your medication as directed.  Consider talking to your MD about your low morning blood sugar and your Basaglar dose.     Expected Outcomes:  Demonstrated interest in learning. Expect positive outcomes  Education material provided: ADA - How to Thrive: A Guide for Your Journey with Diabetes, Meal plan card and Snack sheet, National Kidney Diet:  Dishing up a Kidney Friendly Meal from [facility].  If problems or questions, patient to contact team via:  Phone and Email  Future DSME appointment: 2 months

## 2019-05-20 NOTE — Patient Instructions (Signed)
Continue to stay active and walk daily. Rethink what you are drinking.  Aim for more water. Avoid any foods/beverages with Phos... Limit your sodium intake.  Avoid added salt, processed meat. Choose baked or grilled rather than fried.  Continue to check your blood sugar 1-2 times daily. Continue to take your medication as directed.  Consider talking to your MD about your low morning blood sugar and your Basaglar dose.

## 2019-05-24 ENCOUNTER — Encounter: Payer: Self-pay | Admitting: Internal Medicine

## 2019-05-24 NOTE — Progress Notes (Addendum)
PATIENT: Fernando Hess DOB: 09/23/1945  REASON FOR VISIT: follow up HISTORY FROM: patient  Chief Complaint  Patient presents with  . Follow-up    Room 5 , OSA/CPAP f/u. "CPAP works well, still has head aches"     HISTORY OF PRESENT ILLNESS: Today 05/25/19 Fernando Hess is a 73 y.o. male here today for follow up of OSA on CPAP. AHI was 8 at last follow up in 02/2019.  He reports he is doing very well from a compliance perspective.  He does note benefit of using CPAP therapy.  He continues to have headaches.  Headaches are described as a tension or pressure in the top of his head.  These headaches have improved somewhat since starting CPAP therapy.  He states that he has taken Tylenol at least once daily every day.  He does not drink much water at all.  He has not monitored his blood pressures at home.  He denies allergy symptoms.  He denies any vision changes, nausea, vomiting, light or sound sensitivity.  Compliance report dated 04/24/2019 through 05/23/2019 reveals that he is using CPAP 30 out of the last 30 days for compliance of 100%.  27 of those days he used CPAP greater than 4 hours for compliance of 90%.  Average usage was 5 hours and 28 minutes.  AHI is now 6.2 on 5 to 16 cm of water and an EPR of 3.  There was no significant leak.  Average pressure 12.5.  Maximum pressure 14.2.   HISTORY: (copied from my note on 03/07/2019)  Fernando Hess is a 73 y.o. male here today for follow up of OSA on CPAP. He does note less headaches since starting CPAP. He may have 1 headache a month now. He is tolerating therapy well and without concerns. Baseline AHI 37.9/h and supine 58.5/h.   02/05/2019 - 03/06/2019 20 Usage days 29/30 days (97%) >= 4 hours 26 days (87%) < 4 hours 3 days (10%) Usage hours 172 hours 43 minutes Average usage (total days) 5 hours 45 minutes Average usage (days used) 5 hours 57 minutes Median usage (days used) 5 hours 50 minutes Total used hours (value since last  reset - 03/06/2019) 352 hours AirSense 10 AutoSet Serial number [id number] Mode AutoSet Min Pressure 5 cmH2O Max Pressure 15 cmH2O EPR Fulltime EPR level 3 Response Soft Therapy Pressure - cmH2O Median: 8.1 95th percentile: 12.8 Maximum: 14.1 Leaks - L/min Median: 2.4 95th percentile: 5.5 Maximum: 23.8 Events per hour AI: 6.8 HI: 2.0 AHI: 8.8 Apnea Index Central: 1.9 Obstructive: 4.8 Unknown: 0.1 RERA Index 0.5 Cheyne-Stokes respiration (average duration per night) 13 minutes (4%)   History (copied from Dr Edwena Felty note on 11/11/2018)  SEG:BTDVV W Canteris a 72 y.o.malepatient, seen here as in a referral from Dr.Ahernforsleep apnea evaluation in relation to sleep.   He reports headaches any time of the day, and his ophthalmologist Dr Deloria Lair, MD suggested the PSG test. He suspected hypoxemia contributing to abnormal vascular congestion. Patient has been physically inactive since Bypass surgery.    Sleep habits are as follows:the couple sleeps in separate rooms, she snores, he doesn't think he does. She says he does.  5 PM is supper time, and bedtime is 10 PM, but he sometimes is in be as early as 7. The patient usually watches TV during the evening hours. He describes his bedroom as not entirely cool, but quiet and dark. He mainly sleeps prone, without any pillows. Sleep duration varies, his  sleep is mainly fragmented by nocturia. He estimates that he can go to the bathroom every 2 hours. The patient reports that he wakes up because he has to go to the bathroom. He has a dry mouth when he wakes up. He has difficulties going back to sleep.  Sometimes he goes back to the den and watches tv for hours and has a snack.  Racing thoughts.  He rises at 8.30 AM - goes back to bed after breakfast. Same after lunch- naps last 2-3 hours (!). He sleeps to escape his headaches.  Estimated sleep time up to 10 hours.   Sleep medical history: never had a sleep  study. Headaches. CKD grade 4 . 2 surgeries for renal stones 2019. DM 2 - 1999, now insulin for the last 6 month. diabetic retinopathy, nephropathy, bradycardia.   Family sleep history:2 sons with OSA.   Social history:married, retired, 2 adult sons. Non smoker, seldomly ETOH,  Caffeine : he drinks coke, 1 Coffee/ day and chocolate, seldom iced tea - when going out.   REVIEW OF SYSTEMS: Out of a complete 14 system review of symptoms, the patient complains only of the following symptoms, headaches and all other reviewed systems are negative.  Epworth Sleepiness Scale: 0  ALLERGIES: Allergies  Allergen Reactions  . Victoza [Liraglutide]     Severe fatigue & insomnia  . Levaquin [Levofloxacin] Itching and Rash  . Lisinopril Itching and Rash    HOME MEDICATIONS: Outpatient Medications Prior to Visit  Medication Sig Dispense Refill  . aspirin 81 MG chewable tablet Chew 1 tablet (81 mg total) by mouth daily.    [name] atorvastatin (LIPITOR) 40 MG tablet Take 40 mg by mouth every morning.     . B-D UF III MINI PEN NEEDLES 31G X 5 MM MISC USE AS DIRECTED WITH LANTUS SOLOSTAR    . cilostazol (PLETAL) 50 MG tablet Take 1 tablet (50 mg total) by mouth 2 (two) times daily. 180 tablet 3  . DULoxetine (CYMBALTA) 60 MG capsule Take 60 mg by mouth daily.   4  . glimepiride (AMARYL) 4 MG tablet Take 4 mg by mouth 2 (two) times daily.     . Insulin Glargine (BASAGLAR KWIKPEN) 100 UNIT/ML SOPN Inject 50 Units into the skin daily.    [name] LEVEMIR FLEXTOUCH 100 UNIT/ML Pen Inject 48 Units into the skin daily.    [name] linagliptin (TRADJENTA) 5 MG TABS tablet Take 5 mg by mouth daily.    . metoprolol succinate (TOPROL-XL) 25 MG 24 hr tablet TAKE 1/2 TABLET BY MOUTH EVERY DAY 45 tablet 1  . Multiple Vitamin (MULTIVITAMIN) tablet Take 1 tablet by mouth daily.    . nitroGLYCERIN (NITROSTAT) 0.4 MG SL tablet Place 0.4 mg under the tongue every 5 (five) minutes as needed for chest pain.    [name] ondansetron  (ZOFRAN-ODT) 4 MG disintegrating tablet Take 4 mg by mouth every 8 (eight) hours as needed for nausea or vomiting.     Glory Rosebush ULTRA test strip FOR USE OF CHEACKING BLOOD ONCE DAILY    . pioglitazone (ACTOS) 45 MG tablet Take 45 mg by mouth daily.     Facility-Administered Medications Prior to Visit  Medication Dose Route Frequency Provider Last Rate Last Dose  . 0.9 %  sodium chloride infusion  500 mL Intravenous Once [name], MD        PAST MEDICAL HISTORY: Past Medical History:  Diagnosis Date  . Atrial premature complexes   . CAD (coronary artery  disease), native coronary artery cardiologist-- dr Raliegh Ip. nelson   hx NSTEMI 09-24-2015  s/p  CABG x5 on 10-01-2017;  post op STEMI inferolateral wall,  SVG OM1 and SVG OM2 occluded, distal OM occlusion the calpruit, treated medically  . CKD (chronic kidney disease), stage III ([place])   . Erectile dysfunction   . Esophageal reflux   . History of atrial fibrillation    post op CABG 10-02-2015  . History of kidney stones   . History of non-ST elevation myocardial infarction (NSTEMI) 09/24/2015   s/p  CABG x5  . History of ST elevation myocardial infarction (STEMI) 10/22/2015   inferior wall,  post op CABG 10-02-2015  . Hyperlipidemia   . Hypertension   . Kidney stone    h/o  . Left ureteral stone   . Mild atherosclerosis of both carotid arteries   . Nephrolithiasis    per CT bilateral non-obstructive calculi  . Peripheral artery disease    LE Arterial US 01/2019: R PTA and ATA occluded; L ATA occluded  . RBBB (right bundle branch block)   . Renal atrophy, right   . Sleep apnea    wears cpap   . ST elevation myocardial infarction (STEMI) of inferior wall ([place]) 10/22/2015  . Type 2 diabetes mellitus treated with insulin ([place])    followed by pcp  . Wears glasses     PAST SURGICAL HISTORY: Past Surgical History:  Procedure Laterality Date  . APPENDECTOMY  1965  . CARDIAC CATHETERIZATION N/A 09/26/2015   Procedure: Left Heart  Cath and Coronary Angiography;  Surgeon: Troy Sine, MD;  Location: [facility];  Service: Cardiovascular;  Laterality: N/A;  . CARDIAC CATHETERIZATION N/A 10/22/2015   Procedure: Left Heart Cath and Coronary Angiography;  Surgeon: Sherren Mocha, MD;  Location: [facility];  Service: Cardiovascular;  Laterality: N/A;  . CATARACT EXTRACTION W/ INTRAOCULAR LENS  IMPLANT, BILATERAL  2017  . COLONOSCOPY    . CORONARY ARTERY BYPASS GRAFT N/A 10/02/2015   Procedure: CORONARY ARTERY BYPASS GRAFTING (CABG) X5 LIMA-LAD; SVG-DIAG; SVG-OM; SVG-PD; SVG-RAMUS TRANSESOPHAGEAL ECHOCARDIOGRAM (TEE) ENDOSCOPIC GREATER SAPHENOUS VEIN HARVEST BILAT LE;  Surgeon: Ivin Poot, MD;  Location: [facility];  Service: Open Heart Surgery;  Laterality: N/A;  . CYSTOSCOPY/URETEROSCOPY/HOLMIUM LASER/STENT PLACEMENT Left 08/10/2018   Procedure: CYSTOSCOPY/URETEROSCOPY/HOLMIUM LASER/STENT PLACEMENT;  Surgeon: Festus Aloe, MD;  Location: [facility];  Service: Urology;  Laterality: Left;  . CYSTOSCOPY/URETEROSCOPY/HOLMIUM LASER/STENT PLACEMENT Left 09/10/2018   Procedure: CYSTOSCOPY/URETEROSCOPY/HOLMIUM LASER/STENT EXCHANGE;  Surgeon: Festus Aloe, MD;  Location: [facility];  Service: Urology;  Laterality: Left;  . LEFT HEART CATHETERIZATION WITH CORONARY ANGIOGRAM N/A 04/13/2014   Procedure: LEFT HEART CATHETERIZATION WITH CORONARY ANGIOGRAM;  Surgeon: Jacolyn Reedy, MD;  Location: [facility];  Service: Cardiovascular;  Laterality: N/A;  . LEG SURGERY Right age 2   closed reduction leg fracture  . POLYPECTOMY    . TEE WITHOUT CARDIOVERSION N/A 10/02/2015   Procedure: TRANSESOPHAGEAL ECHOCARDIOGRAM (TEE);  Surgeon: Ivin Poot, MD;  Location: [facility];  Service: Open Heart Surgery;  Laterality: N/A;  . URETEROSCOPY WITH HOLMIUM LASER LITHOTRIPSY Bilateral 2004;  2005  dr grapey  @[email or web address]   . VASECTOMY      FAMILY HISTORY: Family History  Problem Relation Age of Onset  . Heart attack  Mother   . Heart attack Father   . Diabetes Brother   . Pancreatic cancer Brother   . Diabetes Brother   . Colon cancer Neg Hx   .  Esophageal cancer Neg Hx   . Prostate cancer Neg Hx   . Rectal cancer Neg Hx   . Stomach cancer Neg Hx   . Colon polyps Neg Hx     SOCIAL HISTORY: Social History   Socioeconomic History  . Marital status: Married    Spouse name: Not on file  . Number of children: Not on file  . Years of education: Not on file  . Highest education level: Not on file  Occupational History  . Occupation: retired  Scientific laboratory technician  . Financial resource strain: Not on file  . Food insecurity    Worry: Not on file    Inability: Not on file  . Transportation needs    Medical: Not on file    Non-medical: Not on file  Tobacco Use  . Smoking status: Never Smoker  . Smokeless tobacco: Never Used  Substance and Sexual Activity  . Alcohol use: Not Currently  . Drug use: Never  . Sexual activity: Not on file  Lifestyle  . Physical activity    Days per week: Not on file    Minutes per session: Not on file  . Stress: Not on file  Relationships  . Social Herbalist on phone: Not on file    Gets together: Not on file    Attends religious service: Not on file    Active member of club or organization: Not on file    Attends meetings of clubs or organizations: Not on file    Relationship status: Not on file  . Intimate partner violence    Fear of current or ex partner: Not on file    Emotionally abused: Not on file    Physically abused: Not on file    Forced sexual activity: Not on file  Other Topics Concern  . 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  Generalized: Well developed, in no acute  distress  Neurological examination  Mentation: Alert oriented to time, place, history taking. Follows all commands speech and language fluent Cranial nerve II-XII: Pupils were equal round reactive to light. Extraocular movements were full, visual field were full on confrontational test. Facial sensation and strength were normal. Uvula tongue midline. Head turning and shoulder shrug  were normal and symmetric. Motor: The motor testing reveals 5 over 5 strength of all 4 extremities. Good symmetric motor tone is noted throughout.  Sensory: Sensory testing is intact to soft touch on all 4 extremities. No evidence of extinction is noted.  Coordination: Cerebellar testing reveals good finger-nose-finger and heel-to-shin bilaterally.  Gait and station: Gait is normal.   DIAGNOSTIC DATA (LABS, IMAGING, TESTING) - I reviewed patient records, labs, notes, testing and imaging myself where available.  No flowsheet data found.   Lab Results  Component Value Date   WBC 8.3 09/06/2018   HGB 13.6 09/06/2018   HCT 43.6 09/06/2018   MCV 97.5 09/06/2018   PLT 174 09/06/2018      Component Value Date/Time   NA 139 09/06/2018 0803   K 5.2 (H) 09/06/2018 0803   CL 106 09/06/2018 0803   CO2 25 09/06/2018  6789   GLUCOSE 223 (H) 09/06/2018 0803   BUN 32 (H) 09/06/2018 0803   CREATININE 1.82 (H) 09/06/2018 0803   CALCIUM 9.2 09/06/2018 0803   PROT 6.4 (L) 08/07/2018 1950   ALBUMIN 3.9 08/07/2018 1950   AST 27 08/07/2018 1950   ALT 30 08/07/2018 1950   ALKPHOS 59 08/07/2018 1950   BILITOT 1.3 (H) 08/07/2018 1950   GFRNONAA 36 (L) 09/06/2018 0803   GFRAA 41 (L) 09/06/2018 0803   Lab Results  Component Value Date   CHOL 81 10/23/2015   HDL 30 (L) 10/23/2015   LDLCALC 31 10/23/2015   TRIG 102 10/23/2015   CHOLHDL 2.7 10/23/2015   Lab Results  Component Value Date   HGBA1C 10.8 (H) 09/06/2018   Lab Results  Component Value Date   VITAMINB12 602 10/04/2009   Lab Results  Component Value  Date   TSH 0.468 10/22/2015       ASSESSMENT AND PLAN 73 y.o. year old male  has a past medical history of Atrial premature complexes, CAD (coronary artery disease), native coronary artery (cardiologist-- dr Liane Comber), CKD (chronic kidney disease), stage III ([place]), Erectile dysfunction, Esophageal reflux, History of atrial fibrillation, History of kidney stones, History of non-ST elevation myocardial infarction (NSTEMI) (09/24/2015), History of ST elevation myocardial infarction (STEMI) (10/22/2015), Hyperlipidemia, Hypertension, Kidney stone, Left ureteral stone, Mild atherosclerosis of both carotid arteries, Nephrolithiasis, Peripheral artery disease, RBBB (right bundle branch block), Renal atrophy, right, Sleep apnea, ST elevation myocardial infarction (STEMI) of inferior wall ([place]) (10/22/2015), Type 2 diabetes mellitus treated with insulin ([place]), and Wears glasses. here with     ICD-10-CM   1. OSA on CPAP  G47.33    Z99.89   2. Chronic tension-type headache, not intractable  G44.229     [name] is doing very well from a compliance perspective.  Compliance data reviewed reveals excellent compliance.  AHI has responded and is now 6.2.  We will continue current pressure settings.  He was encouraged to continue using CPAP therapy nightly and for greater than 4 hours each night.  We have discussed potential preventative medications for tension type headaches.  He does not wish to start any new medications at this time.  He has noted some benefit with CPAP therapy.  He wishes to continue therapy and monitor headaches.  I have encouraged him to avoid daily use of Tylenol.  I recommend no more than 1-2 times weekly.  Would also like him to keep an eye on his blood pressure at home.  He was instructed to reach out to primary care if readings are greater than 140/90 consistently.  I have encouraged him to drink at least 4 bottles of water daily.  He is aware to reach out to [place] if headaches worsen or  change with any new or worrisome symptoms.  He will follow-up in 6 months.  He verbalizes understanding and agreement with this plan.   No orders of the defined types were placed in this encounter.    No orders of the defined types were placed in this encounter.     I spent 15 minutes with the patient. 50% of this time was spent counseling and educating patient on plan of care and medications.    Debbora Presto, FNP-C 05/25/2019, 11:42 AM [facility] [address], [place], [place] [address] [phone]  Made any corrections needed, and agree with history, physical, neuro exam,assessment and plan as stated.     Sarina Ill, MD  [facility]

## 2019-05-25 ENCOUNTER — Encounter: Payer: Self-pay | Admitting: Family Medicine

## 2019-05-25 ENCOUNTER — Other Ambulatory Visit: Payer: Self-pay

## 2019-05-25 ENCOUNTER — Ambulatory Visit (INDEPENDENT_AMBULATORY_CARE_PROVIDER_SITE_OTHER): Payer: Medicare Other | Admitting: Family Medicine

## 2019-05-25 VITALS — BP 134/80 | HR 82 | Temp 97.3°F | Ht 65.0 in | Wt 220.4 lb

## 2019-05-25 DIAGNOSIS — G4733 Obstructive sleep apnea (adult) (pediatric): Secondary | ICD-10-CM | POA: Diagnosis not present

## 2019-05-25 DIAGNOSIS — Z9989 Dependence on other enabling machines and devices: Secondary | ICD-10-CM

## 2019-05-25 DIAGNOSIS — G44229 Chronic tension-type headache, not intractable: Secondary | ICD-10-CM

## 2019-05-25 NOTE — Patient Instructions (Signed)
We will continue to monitor headaches  Try to avoid any over the counter pain medication daily, shoot for 1-2 times weekly as needed  Monitor BP at home, follow up with PCP if readings are greater than 140/90 consistently  Make sure to drink plenty of water   Follow up with [place] 6 months, sooner if needed   Tension Headache, Adult A tension headache is pain, pressure, or aching in your head. Tension headaches can last from 30 minutes to several days. Follow these instructions at home: Managing pain  Take over-the-counter and prescription medicines only as told by your doctor.  When you have a headache, lie down in a dark, quiet room.  If told, put ice on your head and neck: ? Put ice in a plastic bag. ? Place a towel between your skin and the bag. ? Leave the ice on for 20 minutes, 2-3 times a day.  If told, put heat on the back of your neck. Do this as often as your doctor tells you to. Use the kind of heat that your doctor recommends, such as a moist heat pack or a heating pad. ? Place a towel between your skin and the heat. ? Leave the heat on for 20-30 minutes. ? Remove the heat if your skin turns bright red. Eating and drinking  Eat meals on a regular schedule.  Watch how much alcohol you drink: ? If you are a woman and are not pregnant, do not drink more than 1 drink a day. ? If you are a man, do not drink more than 2 drinks a day.  Drink enough fluid to keep your pee (urine) pale yellow.  Do not use a lot of caffeine, or stop using caffeine. Lifestyle  Get enough sleep. Get 7-9 hours of sleep each night. Or get the amount of sleep that your doctor tells you to.  At bedtime, remove all electronic devices from your room. Examples of electronic devices are computers, phones, and tablets.  Find ways to lessen your stress. Some things that can lessen stress are: ? Exercise. ? Deep breathing. ? Yoga. ? Music. ? Positive thoughts.  Sit up straight. Do not tighten  (tense) your muscles.  Do not use any products that have nicotine or tobacco in them, such as cigarettes and e-cigarettes. If you need help quitting, ask your doctor. General instructions   Keep all follow-up visits as told by your doctor. This is important.  Avoid things that can bring on headaches. Keep a journal to find out if certain things bring on headaches. For example, write down: ? What you eat and drink. ? How much sleep you get. ? Any change to your diet or medicines. Contact a doctor if:  Your headache does not get better.  Your headache comes back.  You have a headache and sounds, light, or smells bother you.  You feel sick to your stomach (nauseous) or you throw up (vomit).  Your stomach hurts. Get help right away if:  You suddenly get a very bad headache along with any of these: ? A stiff neck. ? Feeling sick to your stomach. ? Throwing up. ? Feeling weak. ? Trouble seeing. ? Feeling short of breath. ? A rash. ? Feeling unusually sleepy. ? Trouble speaking. ? Pain in your eye or ear. ? Trouble walking or balancing. ? Feeling like you will pass out (faint). ? Passing out. Summary  A tension headache is pain, pressure, or aching in your head.  Tension headaches  can last from 30 minutes to several days.  Lifestyle changes and medicines may help relieve pain. This information is not intended to replace advice given to you by your health care provider. Make sure you discuss any questions you have with your health care provider. Document Released: 11/26/2009 Document Revised: 08/14/2017 Document Reviewed: 12/12/2016 Elsevier Patient Education  [place]. Analgesic Rebound Headache An analgesic rebound headache, sometimes called a medication overuse headache, is a headache that comes after pain medicine (analgesic) taken to treat the original (primary) headache has worn off. Any type of primary headache can return as a rebound headache if a person  regularly takes analgesics more than three times a week to treat it. The types of primary headaches that are commonly associated with rebound headaches include:  Migraines.  Headaches that arise from tense muscles in the head and neck area (tension headaches).  Headaches that develop and happen again (recur) on one side of the head and around the eye (cluster headaches). If rebound headaches continue, they become chronic daily headaches. What are the causes? This condition may be caused by frequent use of:  Over-the-counter medicines such as aspirin, ibuprofen, and acetaminophen.  Sinus relief medicines and other medicines that contain caffeine.  Narcotic pain medicines such as codeine and oxycodone. What are the signs or symptoms? The symptoms of a rebound headache are the same as the symptoms of the original headache. Some of the symptoms of specific types of headaches include: Migraine headache  Pulsing or throbbing pain on one or both sides of the head.  Severe pain that interferes with daily activities.  Pain that is worsened by physical activity.  Nausea, vomiting, or both.  Pain with exposure to bright light, loud noises, or strong smells.  General sensitivity to bright light, loud noises, or strong smells.  Visual changes.  Numbness of one or both arms. Tension headache  Pressure around the head.  Dull, aching head pain.  Pain felt over the front and sides of the head.  Tenderness in the muscles of the head, neck, and shoulders. Cluster headache  Severe pain that begins in or around one eye or temple.  Redness and tearing in the eye on the same side as the pain.  Droopy or swollen eyelid.  One-sided head pain.  Nausea.  Runny nose.  Sweaty, pale facial skin.  Restlessness. How is this diagnosed? This condition is diagnosed by:  Reviewing your medical history. This includes the nature of your primary headaches.  Reviewing the types of pain  medicines that you have been using to treat your headaches and how often you take them. How is this treated? This condition may be treated or managed by:  Discontinuing frequent use of the analgesic medicine. Doing this may worsen your headaches at first, but the pain should eventually become more manageable, less frequent, and less severe.  Seeing a headache specialist. He or she may be able to help you manage your headaches and help make sure there is not another cause of the headaches.  Using methods of stress relief, such as acupuncture, counseling, biofeedback, and massage. Talk with your health care provider about which methods might be good for you. Follow these instructions at home:  Take over-the-counter and prescription medicines only as told by your health care provider.  Stop the repeated use of pain medicine as told by your health care provider. Stopping can be difficult. Carefully follow instructions from your health care provider.  Avoid triggers that are known to  cause your primary headaches.  Keep all follow-up visits as told by your health care provider. This is important. Contact a health care provider if:  You continue to experience headaches after following treatments that your health care provider recommended. Get help right away if:  You develop new headache pain.  You develop headache pain that is different than what you have experienced in the past.  You develop numbness or tingling in your arms or legs.  You develop changes in your speech or vision. This information is not intended to replace advice given to you by your health care provider. Make sure you discuss any questions you have with your health care provider. Document Released: 11/22/2003 Document Revised: 08/14/2017 Document Reviewed: 02/04/2016 Elsevier Patient Education  2020 [employer].

## 2019-05-30 ENCOUNTER — Telehealth: Payer: Self-pay | Admitting: Internal Medicine

## 2019-05-30 NOTE — Telephone Encounter (Signed)
Do you now or have you had a fever in the last 14 days?    No ° °  ° °Do you have any respiratory symptoms of shortness of breath or cough now or in the last 14 days?   No ° °  ° °Do you have any family members or close contacts with diagnosed or suspected Covid-19 in the past 14 days?   No  ° °  ° °Have you been tested for Covid-19 and found to be positive?   No ° °  ° °Pt made aware to check in on theth floor and that care partner may wait in the car or come up to the lobby during the procedure but will need to provide their own mask. °

## 2019-05-31 ENCOUNTER — Encounter: Payer: Medicare Other | Admitting: Internal Medicine

## 2019-05-31 ENCOUNTER — Ambulatory Visit (AMBULATORY_SURGERY_CENTER): Payer: Medicare Other | Admitting: Internal Medicine

## 2019-05-31 ENCOUNTER — Ambulatory Visit: Payer: Medicare Other | Admitting: Cardiovascular Disease

## 2019-05-31 ENCOUNTER — Other Ambulatory Visit: Payer: Self-pay

## 2019-05-31 ENCOUNTER — Encounter: Payer: Self-pay | Admitting: Internal Medicine

## 2019-05-31 VITALS — BP 106/72 | HR 72 | Temp 97.4°F | Resp 16 | Ht 64.0 in | Wt 220.0 lb

## 2019-05-31 DIAGNOSIS — K253 Acute gastric ulcer without hemorrhage or perforation: Secondary | ICD-10-CM

## 2019-05-31 DIAGNOSIS — R1310 Dysphagia, unspecified: Secondary | ICD-10-CM | POA: Diagnosis not present

## 2019-05-31 DIAGNOSIS — K2990 Gastroduodenitis, unspecified, without bleeding: Secondary | ICD-10-CM | POA: Diagnosis not present

## 2019-05-31 DIAGNOSIS — Q394 Esophageal web: Secondary | ICD-10-CM | POA: Diagnosis not present

## 2019-05-31 DIAGNOSIS — K2980 Duodenitis without bleeding: Secondary | ICD-10-CM

## 2019-05-31 MED ORDER — PANTOPRAZOLE SODIUM 40 MG PO TBEC: 40.0000 mg | DELAYED_RELEASE_TABLET | Freq: Every day | ORAL | 6 refills | Status: DC

## 2019-05-31 MED ORDER — SODIUM CHLORIDE 0.9 % IV SOLN: 500.0000 mL | INTRAVENOUS | Status: DC

## 2019-05-31 NOTE — Patient Instructions (Signed)
Thank you for letting us take care of your healthcare needs today. New rx Pantoprazole sent to your pharmacy. Please see post dilation diet.     YOU HAD AN ENDOSCOPIC PROCEDURE TODAY AT [facility]:   Refer to the procedure report that was given to you for any specific questions about what was found during the examination.  If the procedure report does not answer your questions, please call your gastroenterologist to clarify.  If you requested that your care partner not be given the details of your procedure findings, then the procedure report has been included in a sealed envelope for you to review at your convenience later.  YOU SHOULD EXPECT: Some feelings of bloating in the abdomen. Passage of more gas than usual.  Walking can help get rid of the air that was put into your GI tract during the procedure and reduce the bloating. If you had a lower endoscopy (such as a colonoscopy or flexible sigmoidoscopy) you may notice spotting of blood in your stool or on the toilet paper. If you underwent a bowel prep for your procedure, you may not have a normal bowel movement for a few days.  Please Note:  You might notice some irritation and congestion in your nose or some drainage.  This is from the oxygen used during your procedure.  There is no need for concern and it should clear up in a day or so.  SYMPTOMS TO REPORT IMMEDIATELY:    Following upper endoscopy (EGD)  Vomiting of blood or coffee ground material  New chest pain or pain under the shoulder blades  Painful or persistently difficult swallowing  New shortness of breath  Fever of 100F or higher  Black, tarry-looking stools  For urgent or emergent issues, a gastroenterologist can be reached at any hour by calling [phone].   DIET:  Nothing by mouth until 11:45 then clear liquids until 12:45. You may then start a soft diet for the rest of the day. Tomorrow you may proceed to your regular diet.  Drink plenty of  fluids but you should avoid alcoholic beverages for 24 hours.  ACTIVITY:  You should plan to take it easy for the rest of today and you should NOT DRIVE or use heavy machinery until tomorrow (because of the sedation medicines used during the test).    FOLLOW UP: Our staff will call the number listed on your records 48-72 hours following your procedure to check on you and address any questions or concerns that you may have regarding the information given to you following your procedure. If we do not reach you, we will leave a message.  We will attempt to reach you two times.  During this call, we will ask if you have developed any symptoms of COVID 19. If you develop any symptoms (ie: fever, flu-like symptoms, shortness of breath, cough etc.) before then, please call [phone].  If you test positive for Covid 19 in the 2 weeks post procedure, please call and report this information to [place].    If any biopsies were taken you will be contacted by phone or by letter within the next 1-3 weeks.  Please call us at [phone] if you have not heard about the biopsies in 3 weeks.    SIGNATURES/CONFIDENTIALITY: You and/or your care partner have signed paperwork which will be entered into your electronic medical record.  These signatures attest to the fact that that the information above on your After Visit Summary has been  reviewed and is understood.  Full responsibility of the confidentiality of this discharge information lies with you and/or your care-partner.  Thank you for letting us take care of your healthcare needs today.

## 2019-05-31 NOTE — Op Note (Signed)
[facility] Patient Name: Fernando Hess Procedure Date: 05/31/2019 10:17 AM MRN: [id number] Endoscopist: Docia Chuck. Henrene Pastor , MD Age: 73 Referring MD:  Date of Birth: 03/17/1946 Gender: Male Account #: [id number] Procedure:                Upper GI endoscopy with [facility] dilation of                            esophagus (21F); biopsies Indications:              Dysphagia Medicines:                Monitored Anesthesia Care Procedure:                Pre-Anesthesia Assessment:                           - Prior to the procedure, a History and Physical                            was performed, and patient medications and                            allergies were reviewed. The patient's tolerance of                            previous anesthesia was also reviewed. The risks                            and benefits of the procedure and the sedation                            options and risks were discussed with the patient.                            All questions were answered, and informed consent                            was obtained. Prior Anticoagulants: The patient has                            taken no previous anticoagulant or antiplatelet                            agents. ASA Grade Assessment: II - A patient with                            mild systemic disease. After reviewing the risks                            and benefits, the patient was deemed in                            satisfactory condition to undergo the procedure.  After obtaining informed consent, the endoscope was                            passed under direct vision. Throughout the                            procedure, the patient's blood pressure, pulse, and                            oxygen saturations were monitored continuously. The                            Endoscope was introduced through the mouth, and                            advanced to the second part of duodenum. The  upper                            GI endoscopy was accomplished without difficulty.                            The patient tolerated the procedure well. 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 This measured approximately 13 mm. The                            scope was withdrawn after completing the endoscopic                            survey. Dilation was performed with a Maloney                            dilator with mild resistance at 52 Fr. The dilation                            site was examined following endoscope reinsertion                            and showed moderate improvement in luminal                            narrowing as manifested by disruption of the web.                           The stomach revealed multiple superficial antral                            erosions. Biopsies were taken with a cold forceps  for Helicobacter pylori testing using CLOtest.                           The examined duodenum revealed bulbar and post                            bulbar erythema.                           The cardia and gastric fundus were normal on                            retroflexion. Complications:            No immediate complications. Estimated Blood Loss:     Estimated blood loss: none. 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                           - Gastroduodenitis. Biopsied.                           - Normal examined otherwise. Recommendation:           1. Post dilation diet                           2. Prescribe pantoprazole 40 mg daily; #30; 6                            refills                           3. Follow-up biopsies                           4. Routine GI office follow-up 6 weeks. Docia Chuck. Henrene Pastor, MD 05/31/2019 10:46:40 AM This report has been signed electronically.

## 2019-05-31 NOTE — Progress Notes (Signed)
A and O x3. Report to RN. Tolerated MAC anesthesia well.Teeth unchanged after procedure.

## 2019-06-01 LAB — HELICOBACTER PYLORI SCREEN-BIOPSY: UREASE: NEGATIVE

## 2019-06-02 ENCOUNTER — Telehealth: Payer: Self-pay | Admitting: *Deleted

## 2019-06-02 NOTE — Telephone Encounter (Signed)
  Follow up Call-  Call back number 05/31/2019 02/21/2019  Post procedure Call Back phone  # [phone]  Permission to leave phone message Yes Yes  Some recent data might be hidden     Patient questions:  Do you have a fever, pain , or abdominal swelling? No. Pain Score  0 *  Have you tolerated food without any problems? Yes.    Have you been able to return to your normal activities? Yes.    Do you have any questions about your discharge instructions: Diet   No. Medications  Yes.  Patient states that his "other" doctor took him off pantoprazole due to "it's kidney effects." Patient to call office and speak to his nurse. She will relay message to Dr. Henrene Pastor. He agreed.  Follow up visit  No.  Do you have questions or concerns about your Care? No.  Actions: * If pain score is 4 or above: No action needed, pain <4.

## 2019-06-07 ENCOUNTER — Encounter: Payer: Medicare Other | Admitting: Internal Medicine

## 2019-06-08 ENCOUNTER — Ambulatory Visit: Payer: Medicare Other | Admitting: Family Medicine

## 2019-06-17 ENCOUNTER — Encounter: Payer: Self-pay | Admitting: Dietician

## 2019-06-17 ENCOUNTER — Encounter: Payer: Medicare Other | Attending: Family Medicine | Admitting: Dietician

## 2019-06-17 ENCOUNTER — Encounter

## 2019-06-17 ENCOUNTER — Other Ambulatory Visit: Payer: Self-pay

## 2019-06-17 ENCOUNTER — Ambulatory Visit: Payer: Medicare Other | Admitting: Dietician

## 2019-06-17 DIAGNOSIS — N184 Chronic kidney disease, stage 4 (severe): Secondary | ICD-10-CM | POA: Insufficient documentation

## 2019-06-17 DIAGNOSIS — I2510 Atherosclerotic heart disease of native coronary artery without angina pectoris: Secondary | ICD-10-CM | POA: Insufficient documentation

## 2019-06-17 DIAGNOSIS — E119 Type 2 diabetes mellitus without complications: Secondary | ICD-10-CM | POA: Insufficient documentation

## 2019-06-17 NOTE — Progress Notes (Signed)
Diabetes Self-Management Education  Visit Type:  Follow-up  Appt. Start Time: 1530 Appt. End Time: 1615  06/19/2019  [name], identified by name and date of birth, is a 73 y.o. male with a diagnosis of Diabetes: Type 2.    ASSESSMENT Patient is here today with his wife for a follow up.  The focus of this visit is to further discuss meal planning. He reports that he has lost 5 lbs since last visit but I note that his weight has increased from 205 lbs 9/4 to 214 lbs today. He seems to have mild memory issues today. His wife noted that when he has a salad with or as the meal for dinner that he does not eat at night or get up in the middle of the night and snack. She states that she was in the hospital last week with changes to cardiac enzymes.   Last month he went to Dr. Henrene Pastor for Dysphagia- Web in the upper esophagus was dilated. They eat simply prepared meals or out to eat most often. He continues to use regular soda as he does not like regular.  Medications include:  Glimepiride, Basaglar 42 units q am, Tradgjenta, Actose.  He states that he is no longer having low blood sugar and does not want to increase his insulin as he is fearful of further hypoglycemic events.  His fasting BG was 232 this am and had PB crackers last night.   He forgot his BG log today and plans to discuss with Dr. Harrington Challenger next week.  History includes Type 2 Diabetes since 1999, CABG x5 (2017), CKD stage 3, GERD, iron deficiency anemia, depression, pure cholesterolemia on C-pap.   He reports that he is having difficulty swallowing and is to see his gastroenterologist today.  Labs include:  A1C 9% decreased from 11.5%, cholesterol 144, HDL 46, LDL 79, Triglycerides 94 04/15/2019.  eGFR 36, BUN 32, Creatinine 1.82 08/2018. Fasting BG 118 and has been low 2-3 times this past month. He walks 1 mile each day which takes about 45 minutes.  Weight hx:   214 lbs today Approximately 205 lbs 05/20/19 per  patient. Weight was down to 145 lbs 3 years ago prior to open heart surgery.  He was told to eat whatever he wished and gained increased amounts.  Patient lives with his wife.  They share shopping and she does the cooking. 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  Retired in 2005  Weight 214 lb (97.1 kg). Body mass index is 36.73 kg/m.   Diabetes Self-Management Education - 06/19/19 0700      Psychosocial Assessment   Patient Belief/Attitude about Diabetes  Other (comment)   Motivated somewhat but barriers   Self-care barriers  Other (comment)   Memory issues   Self-management support  Doctor's office;Family    Patient Concerns  Nutrition/Meal planning;Problem Solving;Glycemic Control;Weight Control    Special Needs  Simplified materials    Preferred Learning Style  No preference indicated    Learning Readiness  Ready      Pre-Education Assessment   Patient understands the diabetes disease and treatment process.  Demonstrates understanding / competency    Patient understands incorporating nutritional management into lifestyle.  Needs Review    Patient undertands incorporating physical activity into lifestyle.  Needs Review    Patient understands using medications safely.  Needs Review    Patient understands monitoring blood glucose, interpreting and using results  Needs Review    Patient understands prevention,  detection, and treatment of acute complications.  Demonstrates understanding / competency    Patient understands prevention, detection, and treatment of chronic complications.  Demonstrates understanding / competency    Patient understands how to develop strategies to address psychosocial issues.  Needs Review    Patient understands how to develop strategies to promote health/change behavior.  Needs Review      Complications   How often do you check your blood sugar?  1-2 times/day    Fasting Blood glucose range (mg/dL)  >200      Dietary Intake   Breakfast   sausage, egg, and cheese biscuit with jelly and mayo    Snack (morning)  NABS, coffee    Lunch  fish, rice, green beans, cookies, salad, dressing    Dinner  beef, mac & cheese    Snack (evening)  crackers, milk, orange    Beverage(s)  decaf coffee with cream, regular sprite and coke, 2% milk      Exercise   Exercise Type  ADL's      Patient Education   Previous Diabetes Education  Yes (please comment)   1 month ago   Nutrition management   Food label reading, portion sizes and measuring food.;Meal options for control of blood glucose level and chronic complications.    Physical activity and exercise   Role of exercise on diabetes management, blood pressure control and cardiac health.    Medications  Reviewed patients medication for diabetes, action, purpose, timing of dose and side effects.    Monitoring  Identified appropriate SMBG and/or A1C goals.      Individualized Goals (developed by patient)   Nutrition  General guidelines for healthy choices and portions discussed    Physical Activity  Exercise 3-5 times per week;30 minutes per day    Medications  take my medication as prescribed    Monitoring   test my blood glucose as discussed      Patient Self-Evaluation of Goals - Patient rates self as meeting previously set goals (% of time)   Nutrition  50 - 75 %    Medications  >75%    Monitoring  >75%    Reducing Risk  50 - 75 %    Health Coping  >75%      Post-Education Assessment   Patient understands the diabetes disease and treatment process.  Demonstrates understanding / competency    Patient understands incorporating nutritional management into lifestyle.  Needs Review    Patient undertands incorporating physical activity into lifestyle.  Needs Review    Patient understands using medications safely.  Demonstrates understanding / competency    Patient understands monitoring blood glucose, interpreting and using results  Demonstrates understanding / competency    Patient  understands prevention, detection, and treatment of acute complications.  Demonstrates understanding / competency    Patient understands prevention, detection, and treatment of chronic complications.  Demonstrates understanding / competency    Patient understands how to develop strategies to address psychosocial issues.  Demonstrates understanding / competency    Patient understands how to develop strategies to promote health/change behavior.  Needs Review      Outcomes   Program Status  Completed      Subsequent Visit   Since your last visit have you continued or begun to take your medications as prescribed?  Yes    Since your last visit have you experienced any weight changes?  Gain    Weight Gain (lbs)  9  Learning Objective:  Patient will have a greater understanding of diabetes self-management. Patient education plan is to attend individual and/or group sessions per assessed needs and concerns.   Plan:   Patient Instructions  Continue to walk every day.   Continue to decrease your soda intake.  Drink more water. Eat Slowly  Aim for 3-4 Carb Choices per meal (45-60 grams) +/- 1 either way  Aim for 0-1 Carbs per snack if hungry  Include protein in moderation with your meals and snacks Consider reading food labels for Total Carbohydrate of foods Consider checking BG at alternate times per day  Continue taking medication as directed by MD       Expected Outcomes:  Other (comment)(demonstrates interest in learning but barriers)  Education material provided: Food label handouts, Meal plan card and Snack sheet  If problems or questions, patient to contact team via:  Phone  Future DSME appointment: - 4-6 wks [facility], snack list, label reading

## 2019-06-17 NOTE — Patient Instructions (Addendum)
Continue to walk every day.   Continue to decrease your soda intake.  Drink more water. Eat Slowly  Aim for 3-4 Carb Choices per meal (45-60 grams) +/- 1 either way  Aim for 0-1 Carbs per snack if hungry  Include protein in moderation with your meals and snacks Consider reading food labels for Total Carbohydrate of foods Consider checking BG at alternate times per day  Continue taking medication as directed by MD

## 2019-07-13 ENCOUNTER — Ambulatory Visit: Payer: Medicare Other | Admitting: Internal Medicine

## 2019-07-22 ENCOUNTER — Ambulatory Visit: Payer: Medicare Other | Admitting: Dietician

## 2019-08-23 ENCOUNTER — Encounter: Payer: Self-pay | Admitting: Internal Medicine

## 2019-08-23 ENCOUNTER — Other Ambulatory Visit: Payer: Self-pay

## 2019-08-23 ENCOUNTER — Ambulatory Visit (INDEPENDENT_AMBULATORY_CARE_PROVIDER_SITE_OTHER): Payer: Medicare Other | Admitting: Internal Medicine

## 2019-08-23 VITALS — BP 126/72 | HR 80 | Temp 97.4°F | Ht 64.0 in | Wt 213.0 lb

## 2019-08-23 DIAGNOSIS — R1310 Dysphagia, unspecified: Secondary | ICD-10-CM | POA: Diagnosis not present

## 2019-08-23 DIAGNOSIS — K219 Gastro-esophageal reflux disease without esophagitis: Secondary | ICD-10-CM | POA: Diagnosis not present

## 2019-08-23 DIAGNOSIS — Q394 Esophageal web: Secondary | ICD-10-CM

## 2019-08-23 NOTE — Patient Instructions (Signed)
Please follow up in one year 

## 2019-08-23 NOTE — Progress Notes (Signed)
HISTORY OF PRESENT ILLNESS:  Fernando Hess is a 73 y.o. male who was evaluated in this office May 20, 2019 regarding intermittent dysphagia to pills.  See that dictation for details.  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  This was dilated with 52 [place] Maloney dilator.  He was also found to have gastroduodenitis.  Biopsies for Helicobacter pylori were negative.  He continues on pantoprazole 40 mg daily for reflux.  He follows up at this time as requested.  [name] is pleased to report that his dysphagia has completely resolved.  No complaints.  He is quite pleased.  Tolerating his medication well.  He is up-to-date with regards to surveillance colonoscopy  REVIEW OF SYSTEMS:  All non-GI ROS negative unless otherwise stated in the HPI.  Past Medical History:  Diagnosis Date  . Atrial premature complexes   . CAD (coronary artery disease), native coronary artery cardiologist-- dr Raliegh Ip. nelson   hx NSTEMI 09-24-2015  s/p  CABG x5 on 10-01-2017;  post op STEMI inferolateral wall,  SVG OM1 and SVG OM2 occluded, distal OM occlusion the calpruit, treated medically  . CKD (chronic kidney disease), stage III   . Erectile dysfunction   . Esophageal reflux   . History of atrial fibrillation    post op CABG 10-02-2015  . History of kidney stones   . History of non-ST elevation myocardial infarction (NSTEMI) 09/24/2015   s/p  CABG x5  . History of ST elevation myocardial infarction (STEMI) 10/22/2015   inferior wall,  post op CABG 10-02-2015  . Hyperlipidemia   . Hypertension   . Kidney stone    h/o  . Left ureteral stone   . Mild atherosclerosis of both carotid arteries   . Nephrolithiasis    per CT bilateral non-obstructive calculi  . Peripheral artery disease    LE Arterial US 01/2019: R PTA and ATA occluded; L ATA occluded  . RBBB (right bundle branch block)   . Renal atrophy, right   . Sleep apnea    wears cpap   . ST elevation myocardial infarction (STEMI) of inferior wall ([place]) 10/22/2015  . Type 2 diabetes mellitus treated with insulin ([place])    followed by pcp  . Wears glasses     Past Surgical History:  Procedure Laterality Date  . APPENDECTOMY  1965  . CARDIAC CATHETERIZATION N/A 09/26/2015   Procedure: Left Heart Cath and Coronary Angiography;  Surgeon: Troy Sine, MD;  Location: [facility];  Service: Cardiovascular;  Laterality: N/A;  . CARDIAC CATHETERIZATION N/A 10/22/2015   Procedure: Left Heart Cath and Coronary Angiography;  Surgeon: Sherren Mocha, MD;  Location: [facility];  Service: Cardiovascular;  Laterality: N/A;  . CATARACT EXTRACTION W/ INTRAOCULAR LENS  IMPLANT, BILATERAL  2017  . COLONOSCOPY    . CORONARY ARTERY BYPASS GRAFT N/A 10/02/2015   Procedure: CORONARY ARTERY BYPASS GRAFTING (CABG) X5 [place]-LAD; SVG-DIAG; SVG-OM; SVG-PD; SVG-RAMUS TRANSESOPHAGEAL ECHOCARDIOGRAM (TEE) ENDOSCOPIC GREATER SAPHENOUS VEIN HARVEST BILAT LE;  Surgeon: Ivin Poot, MD;  Location: [facility];  Service: Open Heart Surgery;  Laterality: N/A;  . CYSTOSCOPY/URETEROSCOPY/HOLMIUM LASER/STENT PLACEMENT Left 08/10/2018   Procedure: CYSTOSCOPY/URETEROSCOPY/HOLMIUM LASER/STENT PLACEMENT;  Surgeon: Festus Aloe, MD;  Location[facility];  Service: Urology;  Laterality: Left;  . CYSTOSCOPY/URETEROSCOPY/HOLMIUM LASER/STENT PLACEMENT Left 09/10/2018   Procedure: CYSTOSCOPY/URETEROSCOPY/HOLMIUM LASER/STENT EXCHANGE;  Surgeon: Festus Aloe, MD;  Location: [facility];  Service:  Urology;  Laterality: Left;  . LEFT HEART CATHETERIZATION WITH CORONARY ANGIOGRAM N/A 04/13/2014   Procedure: LEFT HEART CATHETERIZATION WITH CORONARY ANGIOGRAM;  Surgeon: Jacolyn Reedy, MD;  [facility];  Service: Cardiovascular;  Laterality: N/A;  . LEG SURGERY Right age 3   closed reduction leg fracture  . POLYPECTOMY    . TEE WITHOUT CARDIOVERSION N/A 10/02/2015    Procedure: TRANSESOPHAGEAL ECHOCARDIOGRAM (TEE);  Surgeon: Ivin Poot, MD;  Location: [facility];  Service: Open Heart Surgery;  Laterality: N/A;  . URETEROSCOPY WITH HOLMIUM LASER LITHOTRIPSY Bilateral 2004;  2005  dr grapey  @[facility]   . VASECTOMY      Social History [place]  reports that he has never smoked. He has never used smokeless tobacco. He reports previous alcohol use. He reports that he does not use drugs.  family history includes Diabetes in his brother and brother; Heart attack in his father and mother; Pancreatic cancer in his brother.  Allergies  Allergen Reactions  . Victoza [Liraglutide]     Severe fatigue & insomnia  . Levaquin [Levofloxacin] Itching and Rash  . Lisinopril Itching and Rash       PHYSICAL EXAMINATION: Vital signs: BP 126/72   Pulse 80   Temp (!) 97.4 F (36.3 C)   Ht 5\' 4"  (1.626 m)   Wt 213 lb (96.6 kg)   BMI 36.56 kg/m   Constitutional: generally well-appearing, no acute distress Psychiatric: alert and oriented x3, cooperative Eyes: extraocular movements intact, anicteric, conjunctiva pink Mouth: Mask Neck: supple no lymphadenopathy Cardiovascular: heart regular rate and rhythm, no murmur Lungs: clear to auscultation bilaterally Abdomen: soft, nontender, nondistended, no obvious ascites, no peritoneal signs, normal bowel sounds, no organomegaly Rectal: Omitted Extremities: no lower extremity edema bilaterally Skin: no lesions on visible extremities Neuro: No focal deficits.  Cranial nerves intact  ASSESSMENT:  1.  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  GERD.  Symptoms controlled with once daily PPI 3.  History of multiple adenomatous colon polyps.  Surveillance up-to-date   PLAN:  1.  Reflux precautions 2.  Continue pantoprazole.  Medication risks reviewed 3.  Follow-up as needed for issues with recurrent dysphagia 4.  Surveillance colonoscopy around June  2025

## 2019-09-02 ENCOUNTER — Other Ambulatory Visit: Payer: Self-pay | Admitting: Physician Assistant

## 2019-09-02 DIAGNOSIS — R222 Localized swelling, mass and lump, trunk: Secondary | ICD-10-CM

## 2019-09-02 DIAGNOSIS — E65 Localized adiposity: Secondary | ICD-10-CM

## 2019-09-07 ENCOUNTER — Ambulatory Visit
Admission: RE | Admit: 2019-09-07 | Discharge: 2019-09-07 | Disposition: A | Discharge status: Home / Self Care | Payer: Medicare Other | Source: Ambulatory Visit | Attending: Physician Assistant | Admitting: Physician Assistant

## 2019-09-07 DIAGNOSIS — E65 Localized adiposity: Secondary | ICD-10-CM

## 2019-09-07 DIAGNOSIS — R222 Localized swelling, mass and lump, trunk: Secondary | ICD-10-CM

## 2019-11-16 ENCOUNTER — Ambulatory Visit (INDEPENDENT_AMBULATORY_CARE_PROVIDER_SITE_OTHER): Payer: Medicare Other | Admitting: Nurse Practitioner

## 2019-11-16 ENCOUNTER — Encounter: Payer: Self-pay | Admitting: Nurse Practitioner

## 2019-11-16 ENCOUNTER — Other Ambulatory Visit: Payer: Self-pay

## 2019-11-16 VITALS — BP 138/70 | HR 72 | Temp 97.2°F | Ht 64.0 in | Wt 217.8 lb

## 2019-11-16 DIAGNOSIS — R194 Change in bowel habit: Secondary | ICD-10-CM

## 2019-11-16 NOTE — Progress Notes (Signed)
Assessment and plan reviewed 

## 2019-11-16 NOTE — Progress Notes (Signed)
IMPRESSION and PLAN:    Fernando Hess is a 74 y.o. male with a pmh significant for, not necessarily limited to OSA, PAD, CAD / CABG, CKD3, HTN, GERD, and colon polyps  # Bowel changes -Started 3 months ago.  Having either loose stool or solid stool associated with incomplete evacuation. Dietary? Medication related? Diabetic enteropathy? -Start Metamucil 1-2 times daily -64 ounces water daily -Follow up with me in 4 weeks.  -he is worried about possibility of colon cancer or bowel blockage. Reassurance provided     HPI:    Primary GI: Dr. Henrene Pastor Chief complaint :  Bowel changes  **History comes from the chart and patient  Fernando Hess comes in with a 75-month history of bowel changes.  His bowel movements over the last 3 months of tended to be on the loose side.  When he does have a solid stool he frequently has difficulty expelling it resulting in incomplete evacuation.  No associated abdominal pain.  He has not noticed any blood in his stool.  He cannot correlate bowel changes with any medication changes (Diovan not started in February).  He cannot correlate bowel changes with any certain foods such as dairy.  He does not use artificial sweeteners.  He is worried about colon cancer.  He is worried about a bowel blockage. He has no abdominal pain, nausea / vomiting.  He had a colonoscopy in June 2020, next one due June 2025.   Data Reviewed:  09/07/19 Cr 1.82 CBC normal  08/07/18 Noncontrast CT abdomen and pelvis for lower abdominal pain -bilateral kidney stones, mild left hydronephrosis.  Stomach, small bowel and colon unremarkable  Review of systems:     No chest pain, no SOB, no fevers, no urinary sx   Past Medical History:  Diagnosis Date  . Atrial premature complexes   . CAD (coronary artery disease), native coronary artery cardiologist-- dr Raliegh Ip. nelson   hx NSTEMI 09-24-2015  s/p  CABG x5 on 10-01-2017;  post op STEMI inferolateral wall,  SVG OM1 and SVG OM2 occluded, distal  OM occlusion the calpruit, treated medically  . CKD (chronic kidney disease), stage III   . Erectile dysfunction   . Esophageal reflux   . History of atrial fibrillation    post op CABG 10-02-2015  . History of kidney stones   . History of non-ST elevation myocardial infarction (NSTEMI) 09/24/2015   s/p  CABG x5  . History of ST elevation myocardial infarction (STEMI) 10/22/2015   inferior wall,  post op CABG 10-02-2015  . Hyperlipidemia   . Hypertension   . Kidney stone    h/o  . Left ureteral stone   . Mild atherosclerosis of both carotid arteries   . Nephrolithiasis    per CT bilateral non-obstructive calculi  . Peripheral artery disease    LE Arterial US 01/2019: R PTA and ATA occluded; L ATA occluded  . RBBB (right bundle branch block)   . Renal atrophy, right   . Sleep apnea    wears cpap   . ST elevation myocardial infarction (STEMI) of inferior wall ([place]) 10/22/2015  . Type 2 diabetes mellitus treated with insulin ([place])    followed by pcp  . Wears glasses     Patient's surgical history, family medical history, social history, medications and allergies were all reviewed in Epic   Creatinine clearance cannot be calculated (Patient's most recent lab result is older than the maximum 21 days allowed.)  Current Outpatient Medications  Medication Sig Dispense Refill  . aspirin 81 MG chewable tablet Chew 1 tablet (81 mg total) by mouth daily.    [name] atorvastatin (LIPITOR) 40 MG tablet Take 40 mg by mouth every morning.     . B-D UF III MINI PEN NEEDLES 31G X 5 MM MISC USE AS DIRECTED WITH LANTUS SOLOSTAR    . cilostazol (PLETAL) 50 MG tablet Take 1 tablet (50 mg total) by mouth 2 (two) times daily. 180 tablet 3  . DULoxetine (CYMBALTA) 60 MG capsule Take 60 mg by mouth daily.   4  . glimepiride (AMARYL) 4 MG tablet Take 4 mg by mouth 2 (two) times daily.     . Insulin Glargine (BASAGLAR KWIKPEN) 100 UNIT/ML SOPN Inject 56 Units into the skin daily.     [name] LEVEMIR FLEXTOUCH 100  UNIT/ML Pen Inject 48 Units into the skin daily.    [name] linagliptin (TRADJENTA) 5 MG TABS tablet Take 5 mg by mouth daily.    . metoprolol succinate (TOPROL-XL) 25 MG 24 hr tablet TAKE 1/2 TABLET BY MOUTH EVERY DAY 45 tablet 1  . Multiple Vitamin (MULTIVITAMIN) tablet Take 1 tablet by mouth daily.    . nitroGLYCERIN (NITROSTAT) 0.4 MG SL tablet Place 0.4 mg under the tongue every 5 (five) minutes as needed for chest pain.    [name] ondansetron (ZOFRAN-ODT) 4 MG disintegrating tablet Take 4 mg by mouth every 8 (eight) hours as needed for nausea or vomiting.     Glory Rosebush ULTRA test strip FOR USE OF CHEACKING BLOOD ONCE DAILY    . pantoprazole (PROTONIX) 40 MG tablet Take 1 tablet (40 mg total) by mouth daily. 30 tablet 6  . pioglitazone (ACTOS) 45 MG tablet Take 45 mg by mouth daily.     Current Facility-Administered Medications  Medication Dose Route Frequency Provider Last Rate Last Admin  . 0.9 %  sodium chloride infusion  500 mL Intravenous Once [name], MD      . 0.9 %  sodium chloride infusion  500 mL Intravenous Continuous [name], MD        Physical Exam:     Temp (!) 97.2 F (36.2 C)   Ht 5\' 4"  (1.626 m)   Wt 217 lb 12.8 oz (98.8 kg)   BMI 37.39 kg/m   GENERAL:  Pleasant male in NAD PSYCH: : Cooperative, normal affect CARDIAC:  Regular rate, murmur present, no peripheral edema PULM: Normal respiratory effort, lungs CTA bilaterally, no wheezing ABDOMEN:  Nondistended, soft, nontender. No obvious masses, no hepatomegaly,  normal bowel sounds SKIN:  turgor, no lesions seen Musculoskeletal:  Normal muscle tone, normal strength NEURO: Alert and oriented x 3, no focal neurologic deficits  I spent 30 minutes total reviewing records, obtaining history, performing exam, counseling patient and documenting visit / findings.   Fernando Hess , NP 11/16/2019, 9:04 AM

## 2019-11-16 NOTE — Patient Instructions (Addendum)
If you are age 76 or older, your body mass index should be between 23-30. Your Body mass index is 37.39 kg/m. If this is out of the aforementioned range listed, please consider follow up with your Primary Care Provider.  If you are age 70 or younger, your body mass index should be between 19-25. Your Body mass index is 37.39 kg/m. If this is out of the aformentioned range listed, please consider follow up with your Primary Care Provider.   Start Metamucil 1-2 times daily.  This is over-the-counter.  Drink 64 ounces of water daily.  Follow up with me on in four weeks.  Please call the office for an appointment in a couple of weeks as the schedule is not available at this time.  Thank you for choosing me and [facility].   Tye Savoy, NP

## 2019-11-18 ENCOUNTER — Observation Stay (HOSPITAL_COMMUNITY)
Admission: EM | Admit: 2019-11-18 | Discharge: 2019-11-20 | Disposition: A | Discharge status: Home / Self Care | Payer: Medicare Other | Attending: Cardiovascular Disease | Admitting: Cardiovascular Disease

## 2019-11-18 ENCOUNTER — Emergency Department (HOSPITAL_COMMUNITY): Payer: Medicare Other

## 2019-11-18 ENCOUNTER — Encounter (HOSPITAL_COMMUNITY): Payer: Self-pay | Admitting: Emergency Medicine

## 2019-11-18 ENCOUNTER — Other Ambulatory Visit: Payer: Self-pay

## 2019-11-18 ENCOUNTER — Telehealth: Payer: Self-pay | Admitting: Cardiology

## 2019-11-18 DIAGNOSIS — Z9989 Dependence on other enabling machines and devices: Secondary | ICD-10-CM

## 2019-11-18 DIAGNOSIS — K219 Gastro-esophageal reflux disease without esophagitis: Secondary | ICD-10-CM | POA: Diagnosis not present

## 2019-11-18 DIAGNOSIS — N184 Chronic kidney disease, stage 4 (severe): Secondary | ICD-10-CM | POA: Diagnosis present

## 2019-11-18 DIAGNOSIS — Z951 Presence of aortocoronary bypass graft: Secondary | ICD-10-CM | POA: Diagnosis not present

## 2019-11-18 DIAGNOSIS — Z794 Long term (current) use of insulin: Secondary | ICD-10-CM | POA: Insufficient documentation

## 2019-11-18 DIAGNOSIS — E1122 Type 2 diabetes mellitus with diabetic chronic kidney disease: Secondary | ICD-10-CM | POA: Insufficient documentation

## 2019-11-18 DIAGNOSIS — Z7902 Long term (current) use of antithrombotics/antiplatelets: Secondary | ICD-10-CM | POA: Diagnosis not present

## 2019-11-18 DIAGNOSIS — E113312 Type 2 diabetes mellitus with moderate nonproliferative diabetic retinopathy with macular edema, left eye: Secondary | ICD-10-CM

## 2019-11-18 DIAGNOSIS — I2510 Atherosclerotic heart disease of native coronary artery without angina pectoris: Secondary | ICD-10-CM | POA: Diagnosis not present

## 2019-11-18 DIAGNOSIS — I4510 Unspecified right bundle-branch block: Secondary | ICD-10-CM | POA: Insufficient documentation

## 2019-11-18 DIAGNOSIS — I130 Hypertensive heart and chronic kidney disease with heart failure and stage 1 through stage 4 chronic kidney disease, or unspecified chronic kidney disease: Principal | ICD-10-CM | POA: Insufficient documentation

## 2019-11-18 DIAGNOSIS — I080 Rheumatic disorders of both mitral and aortic valves: Secondary | ICD-10-CM | POA: Diagnosis not present

## 2019-11-18 DIAGNOSIS — I5033 Acute on chronic diastolic (congestive) heart failure: Secondary | ICD-10-CM | POA: Diagnosis not present

## 2019-11-18 DIAGNOSIS — Z7982 Long term (current) use of aspirin: Secondary | ICD-10-CM | POA: Diagnosis not present

## 2019-11-18 DIAGNOSIS — I252 Old myocardial infarction: Secondary | ICD-10-CM | POA: Diagnosis not present

## 2019-11-18 DIAGNOSIS — G4733 Obstructive sleep apnea (adult) (pediatric): Secondary | ICD-10-CM | POA: Diagnosis not present

## 2019-11-18 DIAGNOSIS — Z20822 Contact with and (suspected) exposure to covid-19: Secondary | ICD-10-CM | POA: Diagnosis not present

## 2019-11-18 DIAGNOSIS — E785 Hyperlipidemia, unspecified: Secondary | ICD-10-CM | POA: Diagnosis present

## 2019-11-18 DIAGNOSIS — I160 Hypertensive urgency: Secondary | ICD-10-CM | POA: Insufficient documentation

## 2019-11-18 DIAGNOSIS — E113392 Type 2 diabetes mellitus with moderate nonproliferative diabetic retinopathy without macular edema, left eye: Secondary | ICD-10-CM

## 2019-11-18 DIAGNOSIS — I739 Peripheral vascular disease, unspecified: Secondary | ICD-10-CM | POA: Diagnosis present

## 2019-11-18 DIAGNOSIS — I5032 Chronic diastolic (congestive) heart failure: Secondary | ICD-10-CM | POA: Diagnosis present

## 2019-11-18 DIAGNOSIS — R0600 Dyspnea, unspecified: Secondary | ICD-10-CM

## 2019-11-18 DIAGNOSIS — I5030 Unspecified diastolic (congestive) heart failure: Secondary | ICD-10-CM | POA: Diagnosis present

## 2019-11-18 DIAGNOSIS — R0602 Shortness of breath: Secondary | ICD-10-CM | POA: Diagnosis present

## 2019-11-18 DIAGNOSIS — I10 Essential (primary) hypertension: Secondary | ICD-10-CM | POA: Diagnosis present

## 2019-11-18 LAB — TROPONIN I (HIGH SENSITIVITY)
Troponin I (High Sensitivity): 11 ng/L (ref <18)
Troponin I (High Sensitivity): 12 ng/L (ref <18)

## 2019-11-18 LAB — GLUCOSE, CAPILLARY: Glucose-Capillary: 325 mg/dL — ABNORMAL HIGH (ref 70–99)

## 2019-11-18 LAB — CBC
HCT: 42.4 % (ref 39.0–52.0)
Hemoglobin: 13.4 g/dL (ref 13.0–17.0)
MCH: 29.7 pg (ref 26.0–34.0)
MCHC: 31.6 g/dL (ref 30.0–36.0)
MCV: 94 fL (ref 80.0–100.0)
Platelets: 262 10*3/uL (ref 150–400)
RBC: 4.51 MIL/uL (ref 4.22–5.81)
RDW: 14.5 % (ref 11.5–15.5)
WBC: 8.4 10*3/uL (ref 4.0–10.5)
nRBC: 0 % (ref 0.0–0.2)

## 2019-11-18 LAB — CBG MONITORING, ED
Glucose-Capillary: 41 mg/dL — CL (ref 70–99)
Glucose-Capillary: 73 mg/dL (ref 70–99)

## 2019-11-18 LAB — BASIC METABOLIC PANEL
Anion gap: 9 (ref 5–15)
BUN: 21 mg/dL (ref 8–23)
CO2: 26 mmol/L (ref 22–32)
Calcium: 9.3 mg/dL (ref 8.9–10.3)
Chloride: 107 mmol/L (ref 98–111)
Creatinine, Ser: 1.81 mg/dL — ABNORMAL HIGH (ref 0.61–1.24)
GFR calc Af Amer: 41 mL/min — ABNORMAL LOW (ref >60)
GFR calc non Af Amer: 36 mL/min — ABNORMAL LOW (ref >60)
Glucose, Bld: 102 mg/dL — ABNORMAL HIGH (ref 70–99)
Potassium: 4.5 mmol/L (ref 3.5–5.1)
Sodium: 142 mmol/L (ref 135–145)

## 2019-11-18 LAB — D-DIMER, QUANTITATIVE: D-Dimer, Quant: 0.4 ug/mL-FEU (ref 0.00–0.50)

## 2019-11-18 LAB — SARS CORONAVIRUS 2 (TAT 6-24 HRS): SARS Coronavirus 2: NEGATIVE

## 2019-11-18 LAB — BRAIN NATRIURETIC PEPTIDE: B Natriuretic Peptide: 131 pg/mL — ABNORMAL HIGH (ref 0.0–100.0)

## 2019-11-18 MED ORDER — ENOXAPARIN SODIUM 40 MG/0.4ML ~~LOC~~ SOLN
40.0000 mg | SUBCUTANEOUS | Status: DC
  Administered 2019-11-18 – 2019-11-19 (×2): 40 mg via SUBCUTANEOUS
  Filled 2019-11-18 (×2): qty 0.4

## 2019-11-18 MED ORDER — ONDANSETRON 4 MG PO TBDP
4.0000 mg | ORAL_TABLET | Freq: Three times a day (TID) | ORAL | Status: DC | PRN
  Filled 2019-11-18: qty 1

## 2019-11-18 MED ORDER — PANTOPRAZOLE SODIUM 40 MG PO TBEC
40.0000 mg | DELAYED_RELEASE_TABLET | Freq: Every day | ORAL | Status: DC
  Administered 2019-11-19 – 2019-11-20 (×2): 40 mg via ORAL
  Filled 2019-11-18 (×2): qty 1

## 2019-11-18 MED ORDER — ATORVASTATIN CALCIUM 40 MG PO TABS
40.0000 mg | ORAL_TABLET | Freq: Every morning | ORAL | Status: DC
  Administered 2019-11-19 – 2019-11-20 (×2): 40 mg via ORAL
  Filled 2019-11-18 (×2): qty 1

## 2019-11-18 MED ORDER — METOPROLOL SUCCINATE ER 25 MG PO TB24
12.5000 mg | ORAL_TABLET | Freq: Every day | ORAL | Status: DC
  Administered 2019-11-19 – 2019-11-20 (×2): 12.5 mg via ORAL
  Filled 2019-11-18 (×2): qty 1

## 2019-11-18 MED ORDER — ASPIRIN 81 MG PO CHEW
81.0000 mg | CHEWABLE_TABLET | Freq: Every day | ORAL | Status: DC
  Administered 2019-11-19 – 2019-11-20 (×2): 81 mg via ORAL
  Filled 2019-11-18 (×2): qty 1

## 2019-11-18 MED ORDER — DULOXETINE HCL 30 MG PO CPEP
30.0000 mg | ORAL_CAPSULE | Freq: Every day | ORAL | Status: DC
  Administered 2019-11-18 – 2019-11-19 (×2): 30 mg via ORAL
  Filled 2019-11-18 (×2): qty 1

## 2019-11-18 MED ORDER — FUROSEMIDE 10 MG/ML IJ SOLN
40.0000 mg | Freq: Once | INTRAMUSCULAR | Status: AC
  Administered 2019-11-18: 40 mg via INTRAVENOUS
  Filled 2019-11-18: qty 4

## 2019-11-18 MED ORDER — ACETAMINOPHEN 325 MG PO TABS
650.0000 mg | ORAL_TABLET | ORAL | Status: DC | PRN
  Administered 2019-11-18: 650 mg via ORAL
  Filled 2019-11-18 (×3): qty 2

## 2019-11-18 MED ORDER — ONDANSETRON HCL 4 MG/2ML IJ SOLN: 4.0000 mg | Freq: Four times a day (QID) | INTRAMUSCULAR | Status: DC | PRN

## 2019-11-18 MED ORDER — DULOXETINE HCL 60 MG PO CPEP
60.0000 mg | ORAL_CAPSULE | Freq: Every day | ORAL | Status: DC
  Administered 2019-11-19 – 2019-11-20 (×2): 60 mg via ORAL
  Filled 2019-11-18 (×3): qty 1

## 2019-11-18 MED ORDER — SODIUM CHLORIDE 0.9% FLUSH: 3.0000 mL | Freq: Once | INTRAVENOUS | Status: DC

## 2019-11-18 MED ORDER — CILOSTAZOL 50 MG PO TABS
50.0000 mg | ORAL_TABLET | Freq: Two times a day (BID) | ORAL | Status: DC
  Administered 2019-11-18 – 2019-11-20 (×4): 50 mg via ORAL
  Filled 2019-11-18 (×5): qty 1

## 2019-11-18 MED ORDER — NITROGLYCERIN 0.4 MG SL SUBL: 0.4000 mg | SUBLINGUAL_TABLET | SUBLINGUAL | Status: DC | PRN

## 2019-11-18 MED ORDER — INSULIN ASPART 100 UNIT/ML ~~LOC~~ SOLN
0.0000 [IU] | Freq: Three times a day (TID) | SUBCUTANEOUS | Status: DC
  Administered 2019-11-19: 3 [IU] via SUBCUTANEOUS
  Administered 2019-11-19 (×2): 5 [IU] via SUBCUTANEOUS
  Administered 2019-11-20: 2 [IU] via SUBCUTANEOUS

## 2019-11-18 NOTE — Telephone Encounter (Signed)
  Pt c/o Shortness Of Breath: STAT if SOB developed within the last 24 hours or pt is noticeably SOB on the phone  1. Are you currently SOB (can you hear that pt is SOB on the phone)? no  2. How long have you been experiencing SOB? Wife states he mentioned it yesterday, but she has noticed his symptoms for 2 weeks.   3. Are you SOB when sitting or when up moving around? Both, also when he's trying to sleep  4. Are you currently experiencing any other symptoms? Diarrhea, anxious  Patient's wife states the patient has been very tired and SOB, but has not mentioned anything to her until yesterday. She says he has not been able to walk far without getting tired and has been having diarrhea. She says he also has been getting more anxious and angry. She also states he has been having trouble with his breathing machine.

## 2019-11-18 NOTE — ED Provider Notes (Signed)
Medical Decision Making: Care of patient assumed from [facility] [name] at 1500.  Agree with history, physical exam and plan.  See their note for further details.  Briefly, 74 y.o. male with PMH/PSH as below.  Past Medical History:  Diagnosis Date  . Atrial premature complexes   . CAD (coronary artery disease), native coronary artery cardiologist-- dr Raliegh Ip. nelson   hx NSTEMI 09-24-2015  s/p  CABG x5 on 10-01-2017;  post op STEMI inferolateral wall,  SVG OM1 and SVG OM2 occluded, distal OM occlusion the calpruit, treated medically  . CKD (chronic kidney disease), stage III   . Erectile dysfunction   . Esophageal reflux   . History of atrial fibrillation    post op CABG 10-02-2015  . History of kidney stones   . History of non-ST elevation myocardial infarction (NSTEMI) 09/24/2015   s/p  CABG x5  . History of ST elevation myocardial infarction (STEMI) 10/22/2015   inferior wall,  post op CABG 10-02-2015  . Hyperlipidemia   . Hypertension   . Kidney stone    h/o  . Left ureteral stone   . Mild atherosclerosis of both carotid arteries   . Nephrolithiasis    per CT bilateral non-obstructive calculi  . Peripheral artery disease    LE Arterial US 01/2019: R PTA and ATA occluded; L ATA occluded  . RBBB (right bundle branch block)   . Renal atrophy, right   . Sleep apnea    wears cpap   . ST elevation myocardial infarction (STEMI) of inferior wall ([place]) 10/22/2015  . Type 2 diabetes mellitus treated with insulin ([place])    followed by pcp  . Wears glasses    Past Surgical History:  Procedure Laterality Date  . APPENDECTOMY  1965  . CARDIAC CATHETERIZATION N/A 09/26/2015   Procedure: Left Heart Cath and Coronary Angiography;  Surgeon: Troy Sine, MD;  Location: [place] [facility];  Service: Cardiovascular;  Laterality: N/A;  . CARDIAC CATHETERIZATION N/A 10/22/2015   Procedure: Left Heart Cath and Coronary Angiography;  Surgeon: Sherren Mocha, MD;  Location: [place] [facility];  Service:  Cardiovascular;  Laterality: N/A;  . CATARACT EXTRACTION W/ INTRAOCULAR LENS  IMPLANT, BILATERAL  2017  . COLONOSCOPY    . CORONARY ARTERY BYPASS GRAFT N/A 10/02/2015   Procedure: CORONARY ARTERY BYPASS GRAFTING (CABG) X5 LIMA-LAD; SVG-DIAG; SVG-OM; SVG-PD; SVG-RAMUS TRANSESOPHAGEAL ECHOCARDIOGRAM (TEE) ENDOSCOPIC GREATER SAPHENOUS VEIN HARVEST BILAT LE;  Surgeon: Ivin Poot, MD;  Location: [facility];  Service: Open Heart Surgery;  Laterality: N/A;  . CYSTOSCOPY/URETEROSCOPY/HOLMIUM LASER/STENT PLACEMENT Left 08/10/2018   Procedure: CYSTOSCOPY/URETEROSCOPY/HOLMIUM LASER/STENT PLACEMENT;  Surgeon: Festus Aloe, MD;  Location:  [facility];  Service: Urology;  Laterality: Left;  . CYSTOSCOPY/URETEROSCOPY/HOLMIUM LASER/STENT PLACEMENT Left 09/10/2018   Procedure: CYSTOSCOPY/URETEROSCOPY/HOLMIUM LASER/STENT EXCHANGE;  Surgeon: Festus Aloe, MD;  Location: [facility];  Service: Urology;  Laterality: Left;  . LEFT HEART CATHETERIZATION WITH CORONARY ANGIOGRAM N/A 04/13/2014   Procedure: LEFT HEART CATHETERIZATION WITH CORONARY ANGIOGRAM;  Surgeon: Jacolyn Reedy, MD;  Location: [facility];  Service: Cardiovascular;  Laterality: N/A;  . LEG SURGERY Right age 20   closed reduction leg fracture  . POLYPECTOMY    . TEE WITHOUT CARDIOVERSION N/A 10/02/2015   Procedure: TRANSESOPHAGEAL ECHOCARDIOGRAM (TEE);  Surgeon: Ivin Poot, MD;  Location: [place];  Service: Open Heart Surgery;  Laterality: N/A;  . URETEROSCOPY WITH HOLMIUM LASER LITHOTRIPSY Bilateral 2004;  2005  dr grapey  @[facility]   . VASECTOMY  Patient HDS at handoff.    Plan at time of handoff: Here for SOB.  Labs reassuring, plan for cardiology to see.  [place] for discharge if cards clears.    ED Course Evaluated by cardiology who will admit SOB.    No acute change during my care of the pt.  Admitted in stable condition.   Clinical Impression 1. Dyspnea, unspecified type        Malahki Gasaway, [place], MD 11/19/19  [place], MD 11/20/19 0030

## 2019-11-18 NOTE — Consult Note (Addendum)
Cardiology Admission History and Physical:   Patient ID: Fernando Hess MRN: [id number]; DOB: 1945-10-09   Admission date: 11/18/2019  Primary Care Provider: [name], MD Primary Cardiologist: Ena Dawley, MD  Primary Electrophysiologist:  None   Chief Complaint: shortness of breath  Patient Profile:   Fernando Hess is a 74 y.o. male with a history of CAD s/p CABG in 09/2015 and then inferior STEMI in 10/2015 secondary to total occlusion of SVG to OM1 and SVG to OM2, PAD with dopplers in 01/2019 showing primarily tibial vessel disease on Pletal, hypertension, hyperlipidemia, type 2 diabetes mellitus, sleep apnea on CPAP, and CKD stage III who is being seen today for the evaluation of shortness of breath at the request of Dr. Kathrynn Humble.  History of Present Illness:   Fernando Hess is a 74 year old male with the above history who is previously followed by Dr. [name] and now followed by Dr. Meda Coffee for general cardiac care and Dr. Gwenlyn Found for PAD. Patient admitted with NSTEMI in 09/2015 and was found to have multivessel disease on cardiac catheterization. He underwent CABG on 10/02/2015. Post-operative course was complicated by atrial fibrillation treated with Amiodarone. He presented back to the hospital in 10/2015 with inferior STEMI. Cardiac catheterization showed occluded SVG to OM1 and occluded SVG to OM2. Distal OM2 occlusion was felt to be the culprit lesion. Medical therapy was recommended at that time. Echo during that admission showed LVEF of 55-60% with normal motion and mild to moderate mitral regurgitation. At office visit in 05/2018, he was noted to have a lot of PACs on EKG. Therefore, 48 hour Holter Monitor was ordered. Monitor showed very frequent supraventricular ectopy contributing to 15% of overall beat as well as frequent couplets and very short runs of atrial tachycardia. Therefore, patient was started on Toprol-XL 25mg  daily. Patient was last seen by Dr. Meda Coffee in 09/2018 at  which time he reported dizziness and 2 recent falls. Nuclear stress test was ordered to evaluate for ischemia and chronotropic competence. Myoview was low risk and shoed apical thinning but no ischemia.   Patient wife called our office today with concerns of patient's worsening shortness of breath and stated that she wanted to take patient to the ED. Our office agreed with this and patient's wife called 911. Patient reports gradually worsening shortness of breath over the last 2-3 weeks with acute worsening both a rest and with exertion yesterday. He notes orthopnea last night and states he was hardly able to sleep because he felt like he could not breathe. He had to go sit in his recline and was finally able to fall asleep for a couple of hours. He denies any edema but does report about a 10lb weight gain over the last 1-2 months. No chest pain. He states this is very different than the way he presented with his MI back in 2017. No palpitations. He does note some lightheadedness/dizziness when his blood sugar drops but otherwise denies any of this. No falls or syncope. No claudication. No abnormal bleeding. No recent fevers, chills, body aches, cough, or nasal congestion. He has already received both doses of the COVID vaccine.   In the ED, patient hypertensive with BP as high as 183/80. EKG showed sinus arrhythmia, rate 80 bpm, with known RBBB. High-sensitivity troponin negative x2. BNP minimally elevated at 131. D-dimer negative. Chest x-ray showed no acute findings. WBC 8.4, Hgb 13.4, Plts 262. Na 142, K 4.5, Glucose 102, BUN 21, Cr 1.81. Given cardiac history,  Cardiology consulted for further evaluation.   At the time of this evaluation, patient resting comfortably in bed. He still reports feeling very short of breath but O2 sats 98-99% on room air. He states it feel like his breath catches when he breathes out.    Heart Pathway Score:     Past Medical History:  Diagnosis Date  . Atrial premature  complexes   . CAD (coronary artery disease), native coronary artery cardiologist-- dr Raliegh Ip. nelson   hx NSTEMI 09-24-2015  s/p  CABG x5 on 10-01-2017;  post op STEMI inferolateral wall,  SVG OM1 and SVG OM2 occluded, distal OM occlusion the calpruit, treated medically  . CKD (chronic kidney disease), stage III   . Erectile dysfunction   . Esophageal reflux   . History of atrial fibrillation    post op CABG 10-02-2015  . History of kidney stones   . History of non-ST elevation myocardial infarction (NSTEMI) 09/24/2015   s/p  CABG x5  . History of ST elevation myocardial infarction (STEMI) 10/22/2015   inferior wall,  post op CABG 10-02-2015  . Hyperlipidemia   . Hypertension   . Kidney stone    h/o  . Left ureteral stone   . Mild atherosclerosis of both carotid arteries   . Nephrolithiasis    per CT bilateral non-obstructive calculi  . Peripheral artery disease    LE Arterial US 01/2019: R PTA and ATA occluded; L ATA occluded  . RBBB (right bundle branch block)   . Renal atrophy, right   . Sleep apnea    wears cpap   . ST elevation myocardial infarction (STEMI) of inferior wall ([place]) 10/22/2015  . Type 2 diabetes mellitus treated with insulin ([place])    followed by pcp  . Wears glasses     Past Surgical History:  Procedure Laterality Date  . APPENDECTOMY  1965  . CARDIAC CATHETERIZATION N/A 09/26/2015   Procedure: Left Heart Cath and Coronary Angiography;  Surgeon: Troy Sine, MD;  Location: [place] [facility];  Service: Cardiovascular;  Laterality: N/A;  . CARDIAC CATHETERIZATION N/A 10/22/2015   Procedure: Left Heart Cath and Coronary Angiography;  Surgeon: Sherren Mocha, MD;  Location: [place] [facility];  Service: Cardiovascular;  Laterality: N/A;  . CATARACT EXTRACTION W/ INTRAOCULAR LENS  IMPLANT, BILATERAL  2017  . COLONOSCOPY    . CORONARY ARTERY BYPASS GRAFT N/A 10/02/2015   Procedure: CORONARY ARTERY BYPASS GRAFTING (CABG) X5 LIMA-LAD; SVG-DIAG; SVG-OM; SVG-PD; SVG-RAMUS  TRANSESOPHAGEAL ECHOCARDIOGRAM (TEE) ENDOSCOPIC GREATER SAPHENOUS VEIN HARVEST BILAT LE;  Surgeon: Ivin Poot, MD;  Location: [place];  Service: Open Heart Surgery;  Laterality: N/A;  . CYSTOSCOPY/URETEROSCOPY/HOLMIUM LASER/STENT PLACEMENT Left 08/10/2018   Procedure: CYSTOSCOPY/URETEROSCOPY/HOLMIUM LASER/STENT PLACEMENT;  Surgeon: Festus Aloe, MD;  Location: [facility];  Service: Urology;  Laterality: Left;  . CYSTOSCOPY/URETEROSCOPY/HOLMIUM LASER/STENT PLACEMENT Left 09/10/2018   Procedure: CYSTOSCOPY/URETEROSCOPY/HOLMIUM LASER/STENT EXCHANGE;  Surgeon: Festus Aloe, MD;  Location: [facility];  Service: Urology;  Laterality: Left;  . LEFT HEART CATHETERIZATION WITH CORONARY ANGIOGRAM N/A 04/13/2014   Procedure: LEFT HEART CATHETERIZATION WITH CORONARY ANGIOGRAM;  Surgeon: Jacolyn Reedy, MD;  Location: [facility];  Service: Cardiovascular;  Laterality: N/A;  . LEG SURGERY Right age 46   closed reduction leg fracture  . POLYPECTOMY    . TEE WITHOUT CARDIOVERSION N/A 10/02/2015   Procedure: TRANSESOPHAGEAL ECHOCARDIOGRAM (TEE);  Surgeon: Ivin Poot, MD;  Location: [place];  Service: Open Heart Surgery;  Laterality: N/A;  . URETEROSCOPY  WITH HOLMIUM LASER LITHOTRIPSY Bilateral 2004;  2005  dr grapey  @[email or web address]   . VASECTOMY       Medications Prior to Admission: Prior to Admission medications   Medication Sig Start Date End Date Taking? Authorizing Provider  aspirin 81 MG chewable tablet Chew 1 tablet (81 mg total) by mouth daily. 10/25/15  Yes Jacolyn Reedy, MD  atorvastatin (LIPITOR) 40 MG tablet Take 40 mg by mouth every morning.    Yes [provider]  cilostazol (PLETAL) 50 MG tablet Take 1 tablet (50 mg total) by mouth 2 (two) times daily. 02/25/19  Yes Lorretta Harp, MD  DULoxetine (CYMBALTA) 30 MG capsule Take 30 mg by mouth at bedtime.   Yes [provider]  DULoxetine (CYMBALTA) 60 MG capsule Take 60 mg by mouth daily.  04/12/18  Yes  [provider]  glimepiride (AMARYL) 4 MG tablet Take 4 mg by mouth 2 (two) times daily.    Yes [provider]  Insulin Glargine (BASAGLAR KWIKPEN) 100 UNIT/ML SOPN Inject 31 Units into the skin daily.    Yes [provider]  linagliptin (TRADJENTA) 5 MG TABS tablet Take 30 mg by mouth daily.    Yes [provider]  Melatonin 3 MG TABS Take 1 tablet by mouth daily as needed.   Yes [provider]  metoprolol succinate (TOPROL-XL) 25 MG 24 hr tablet TAKE 1/2 TABLET BY MOUTH EVERY DAY Patient taking differently: Take 12.5 mg by mouth daily.  03/17/19  Yes Dorothy Spark, MD  Multiple Vitamin (MULTIVITAMIN) tablet Take 1 tablet by mouth daily.   Yes [provider]  nitroGLYCERIN (NITROSTAT) 0.4 MG SL tablet Place 0.4 mg under the tongue every 5 (five) minutes as needed for chest pain.   Yes [provider]  ondansetron (ZOFRAN-ODT) 4 MG disintegrating tablet Take 4 mg by mouth every 8 (eight) hours as needed for nausea or vomiting.    Yes [provider]  ONETOUCH ULTRA test strip FOR USE OF [place] BLOOD ONCE DAILY 03/18/19  Yes [provider]  pantoprazole (PROTONIX) 40 MG tablet Take 1 tablet (40 mg total) by mouth daily. 05/31/19  Yes [name], MD  pioglitazone (ACTOS) 45 MG tablet Take 45 mg by mouth daily.   Yes [provider]  valsartan (DIOVAN) 160 MG tablet Take 160 mg by mouth daily. 10/31/19  Yes [provider]  B-D UF III MINI PEN NEEDLES 31G X 5 MM MISC USE AS DIRECTED WITH LANTUS SOLOSTAR 04/02/19   [provider]     Allergies:    Allergies  Allergen Reactions  . Victoza [Liraglutide]     Severe fatigue & insomnia  . Levaquin [Levofloxacin] Itching and Rash  . Lisinopril Itching and Rash    Social History:   Social History   Socioeconomic History  . Marital status: Married    Spouse name: Not on file  . Number of children: Not on file  . Years of  education: Not on file  . Highest education level: Not on file  Occupational History  . Occupation: retired  Tobacco Use  . Smoking status: Never Smoker  . Smokeless tobacco: Never Used  Substance and Sexual Activity  . Alcohol use: Not Currently  . Drug use: Never  . Sexual activity: Not on file  Other Topics Concern  . 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 Difficulty of Paying Living Expenses: Not on file  Food Insecurity:   . Worried About Charity fundraiser in the Last Year: Not on file  . Ran Out of Food in the Last Year: Not on file  Transportation Needs:   . Lack of Transportation (Medical): Not on file  . Lack of Transportation (Non-Medical): Not on file  Physical Activity:   . Days of Exercise per Week: Not on file  . Minutes of Exercise per Session: Not on file  Stress:   . Feeling of Stress : Not on file  Social Connections:   . Frequency of Communication with Friends and Family: Not on file  . Frequency of Social Gatherings with Friends and Family: Not on file  . Attends Religious Services: Not on file  . Active Member of Clubs or Organizations: Not on file  . Attends Archivist Meetings: Not on file  . Marital Status: Not on file  Intimate Partner Violence:   . Fear of Current or Ex-Partner: Not on file  . Emotionally Abused: Not on file  . Physically Abused: Not on file  . Sexually Abused: Not on file    Family History:   The patient's family history includes Diabetes in his brother and brother; Heart attack in his father and mother; Pancreatic cancer in his brother. There is no history of Colon cancer, Esophageal cancer, Prostate cancer, Rectal cancer, Stomach cancer, or Colon polyps.    ROS:  Please see the history of present illness.  All other ROS reviewed and negative.     Physical Exam/Data:   Vitals:    11/18/19 1315 11/18/19 1330 11/18/19 1400 11/18/19 1430  BP: (!) 163/104 (!) 176/94 (!) 166/78 (!) 187/71  Pulse: 69 71 69 77  Resp: (!) 27 (!) 30 (!) 26 (!) 26  Temp:      TempSrc:      SpO2: 98% 95% 98% 95%  Weight:      Height:       No intake or output data in the 24 hours ending 11/18/19 1651 Last 3 Weights 11/18/2019 11/16/2019 08/23/2019  Weight (lbs) 217 lb 12.8 oz 217 lb 12.8 oz 213 lb  Weight (kg) 98.793 kg 98.793 kg 96.616 kg     Body mass index is 37.39 kg/m.  General: 74 y.o. male resting comfortably in no acute distress. HEENT: Normocephalic and atraumatic. Sclera clear. EOMs intact. Neck: Supple. Mild carotid bruits bilaterally but may be radiation from murmur. No obvious JVD. Heart: RRR. Distinct S1 and S2. III/VI systolic murmur noted throughout but loudest at upper sternal border. No gallops or rubs. Radial pulses 2+ and equal bilaterally. Lungs: No increased work of breathing. Slight decreased breath sounds in bases but no wheezes, rhonchi, or rales.  Abdomen: Soft, non-distended, and non-tender to palpation. Bowel sounds present. Extremities: No lower extremity edema.    Skin: Warm and dry. Neuro: Alert and oriented x3. No focal deficits. Psych: Normal affect. Responds appropriately.  EKG:  The ECG that was done was personally reviewed and demonstrates sinus arrhythmia, rate 80, with known RBBB.  Telemetry: Sinus arrhythmia with frequent PAC. Rates in the 60's to 70's.  Relevant CV Studies:  Left Heart Catheterization 09/26/2015:  Ramus lesion, 90% stenosed.  Prox Cx to Mid Cx lesion, 75% stenosed.  RPDA lesion, 40% stenosed.  1st Diag lesion, 75% stenosed.  Mid LAD lesion, 50% stenosed.  Dist LAD-1 lesion, 80% stenosed.  Dist LAD-2 lesion, 85% stenosed.  1st Mrg lesion, 40% stenosed.  The left ventricular systolic function is normal.   Mild LV dysfunction with an ejection fraction of 45-50% and evidence for mild mid anterolateral  hypocontractility.  Multi vessel CAD with a normal appearing left main; diffusely diseased LAD system with 75% ostial first diagonal stenosis followed by 50% LAD stenosis before the second diagonal, and segmental 80 and 85% mid stenoses; 90% proximal ramus intermediate stenosis; 40% OM1 stenosis and 75% AV groove circumflex stenosis immediately after the takeoff of the OM1 vessel; and 40% mid PDA stenosis of the RCA.  Recommendation: With the patient's history of diabetes mellitus, and diffuse multi-vessel CAD with diabetic appearing vessels, recommend surgical consultation for consideration of CABG revascularization surgery for optimal long-term benefit. _______________  Pre-CABG Dopplers 09/26/2015: Summary:  Findings suggest 1-39% internal carotid artery stenosis  bilaterally. Vertebral arteries are patent with antegrade flow.  Bilateral ABIs are within normal limits. _______________  Left Heart Catheterization 10/22/2015:  Ost LM to LM lesion, 50% stenosed.  Mid LAD lesion, 90% stenosed.  The LIMA to LAD graft is widely patent  Mid RCA lesion, 50% stenosed.  SVG .  The saphenous vein graft to right PDA is widely patent without stenosis  SVG .  The saphenous vein graft to first diagonal is widely patent. This graft retrograde fills the LAD territory, with relatively brisk flow into the proximal septal perforators.  Ost Ramus to Ramus lesion, 90% stenosed.  Ost 1st Mrg to 1st Mrg lesion, 80% stenosed.  Prox Cx lesion, 80% stenosed.  2nd Mrg lesion, 100% stenosed.  SVG .  This graft is occluded just beyond the aortic anastomosis  This graft is occluded in the proximal body  Origin to Prox Graft lesion, 100% stenosed.  Prox Graft lesion, 100% stenosed.   1. 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 S/P recent CABG with patency of the LIMA-LAD, SVG-PDA, and SVG-diagonal 3. Total occlusion of the  SVG-OM1 and SVG-OM2 4. Total occlusion of the distal OM2, suspected culprit for STEMI  Recommend: medical therapy, add clopidogrel, resume heparin in 8 hrs. With diffuse diabetic CAD, may be best to manage medically rather than proceed with complex PCI of the left circumflex branches. I felt best not to acutely intervene on the distal OM2 as the vessel terminates not far beyond the area of occlusion based on preop coronary  angiogram review.  _______________  Echocardiogram 10/23/2015: Study Conclusions: - Left ventricle: The cavity size was normal. Systolic function was  normal. The estimated ejection fraction was in the range of 55%  to 60%. Wall motion was normal; there were no regional wall  motion abnormalities.  - Aortic valve: Trileaflet; mildly thickened, mildly calcified  leaflets. There was mild regurgitation.  - Mitral valve: There was mild to moderate regurgitation.  _______________  Holter Monitor 05/2016:  Bradycardia to sinus tachycardia  Very frequent supraventricular ectopy, contributing to 15% of overall beats.  Very frequent couplets and a very short runs of atrial tachycardia.   Very frequent supraventricular ectopy, contributing to 15% of overall beats. Very frequent couplets and a very short runs of atrial tachycardia. _______________  Myoview 10/21/2018:  Nuclear stress EF: 52%.  There was no ST segment deviation noted during stress.  Defect 1: There is a small defect of mild severity present in the apex location.  This is a low risk study.  The left ventricular ejection fraction is mildly  decreased (45-54%).   Low risk, mildly abnormal stress nuclear study with apical thinning but no ischemia; EF 52 with mild global hypokinesis.  Laboratory Data:  High Sensitivity Troponin:   Recent Labs  Lab 11/18/19 1139 11/18/19 1322  TROPONINIHS 11 12      Chemistry Recent Labs  Lab 11/18/19 1139  NA 142  K 4.5  CL 107  CO2 26  GLUCOSE 102*   BUN 21  CREATININE 1.81*  CALCIUM 9.3  GFRNONAA 36*  GFRAA 41*  ANIONGAP 9    No results for input(s): PROT, ALBUMIN, AST, ALT, ALKPHOS, BILITOT in the last 168 hours. Hematology Recent Labs  Lab 11/18/19 1139  WBC 8.4  RBC 4.51  HGB 13.4  HCT 42.4  MCV 94.0  MCH 29.7  MCHC 31.6  RDW 14.5  PLT 262   BNP Recent Labs  Lab 11/18/19 1300  BNP 131.0*    DDimer  Recent Labs  Lab 11/18/19 1359  DDIMER 0.40     Radiology/Studies:  DG Chest 2 View  Result Date: 11/18/2019 CLINICAL DATA:  Shortness of breath EXAM: CHEST - 2 VIEW COMPARISON:  12/01/2016 FINDINGS: Prior CABG. The heart size and mediastinal contours are within normal limits. No focal airspace consolidation, pleural effusion, or pneumothorax. The visualized skeletal structures are unremarkable. IMPRESSION: No active cardiopulmonary disease. Electronically Signed   By: Davina Poke D.O.   On: 11/18/2019 11:45     Assessment and Plan:   Dyspnea - Presumed Acute on Chronic Diastolic CHF - Patient presents with gradually worsening dyspnea over the last 2-3 weeks with acute worsening yesterday. Also notes orthopnea and weight gain.  - Chest x-ray showed no acute findings.  - BNP minimally elevated at 131. - High-sensitivity troponin negative x2. - D-dimer negative.  - COVID testing has not been done but he has had both vaccinations. Will order COVID test. - Unclear etiology of dyspnea a this time. He does not appear significantly volume overloaded but possible acute on chronic diastolic CHF. He does have a murmur on exam which sounds like possible aortic stenosis which has not been noted on Echo in the past. Will admit for observation and check Echo. Will give one dose of IV Lasix 40mg  to see if that help him symptomatically and will reassess tomorrow.   CAD s/p CABG  - History of CABG x5 in 09/2015 and then inferior STEMI in 10/2015 secondary to total occlusion of SVG to OM1 and SVG to OM2 which was treated  medically. - No acute changes on EKG. - Denies any chest pain.  - Continue aspirin, beta-blocker, and statin.  PAD - Most recent doppler in 01/2019 showed occluded posterior and anterior tibial arteries on right and suspected occluded anterior tibial artery on left.  - Denies any claudication. - Continue Pletal 50mg  twice daily.  Hypertension - BP as high as 181/71 in the ED.  - Home medications include Valsartan 160mg  daily and Toprol-XL 12.5mg  daily.  - Will continue home medications for now and see if IV Lasix helps his pressure. May need to adjust medications tomorrow.   Hyperlipidemia - Continue Lipitor 40mg  daily.  Type 2 Diabetes Mellitus - On Insulin Glargine 31 unit daily as well as Pioglitazone, Glimepiride, and Lingagliptin (which was recently started). - CBG has been low in the ED (as low as the 40's). Therefore, will hold on home medications at this time (including basal insulin) and place on sliding scale.   Sleep Apnea - On CPAP.  CKD Stage III -  Creatinine 1.81 on admission which seems to be close to baseline. - Will monitor closely with above diuresis. - Will check BMET in the morning.    Severity of Illness: The appropriate patient status for this patient is OBSERVATION. Observation status is judged to be reasonable and necessary in order to provide the required intensity of service to ensure the patient's safety. The patient's presenting symptoms, physical exam findings, and initial radiographic and laboratory data in the context of their medical condition is felt to place them at decreased risk for further clinical deterioration. Furthermore, it is anticipated that the patient will be medically stable for discharge from the hospital within 2 midnights of admission. The following factors support the patient status of observation.   " The patient's presenting symptoms include shortness of breath. " The physical exam findings as above " The initial radiographic and  laboratory data as above.     For questions or updates, please contact [place] Please consult [email or web address] for contact info under        Signed, Darreld Mclean, [name]  11/18/2019 4:51 PM   Patient seen, examined. Available data reviewed. Agree with findings, assessment, and plan as outlined by Sande Rives, PA.  The patient is independently interviewed and examined.  He is alert, oriented, in no distress at rest.  He is mildly dyspneic with conversation.  HEENT is normal, carotid upstrokes are normal without bruits, JVP is mildly elevated with positive HJR, lungs are clear bilaterally, heart is regular rate and rhythm with a 2/6 harsh mid peaking systolic murmur best heard at the right upper sternal border.  This murmur is transmitted to the apex but I do not hear a murmur of mitral regurgitation.  Abdomen is soft and nontender with no organomegaly.  Extremities have no pretibial edema.  Skin is warm and dry without rash.  Neurologic is grossly intact.  EKG shows normal sinus rhythm with right bundle branch block and premature atrial contractions.  Chest x-ray shows no active cardiopulmonary disease and no evidence of pleural effusion.  The patient's BNP is mildly elevated at 131.  Troponin is normal at 11 and 12.  Renal function is stable with his known history of stage IIIb chronic kidney disease with a creatinine of 1.81 mg/dL today.  D-dimer is normal at 0.40.  The patient's symptoms are typical of acute on chronic diastolic heart failure and this is our working diagnosis.  He has progressive dyspnea and orthopnea.  He does not have clear evidence of volume overload on physical exam except for mildly elevated jugular venous pressure.  There are no findings of significance on his chest x-ray.  His BNP is mildly elevated.  Will diurese him with IV Lasix tonight and follow his creatinine in the morning.  Will update an echocardiogram to reassess LV function and valvular heart disease.   His exam is suggestive of mild aortic stenosis.  He has a history of mild to moderate mitral regurgitation but I do not really appreciate a murmur of mitral regurgitation on today's exam.  Otherwise will continue his home medicines.  He does have significant ischemic disease and all of this history is reviewed with his prior CABG and early graft failure.  He does not clearly have any acute ischemic symptoms.  Again we will assess LV function with an echocardiogram and if he has developed significant LV dysfunction, he may need further ischemic assessment.  Sherren Mocha, M.D. 11/18/2019 5:49 PM

## 2019-11-18 NOTE — ED Provider Notes (Addendum)
[facility] EMERGENCY DEPARTMENT Provider Note   CSN: [id number] Arrival date & time: 11/18/19  1114     History Chief Complaint  Patient presents with  . Shortness of Breath    Fernando Hess is a 74 y.o. male.  74 year old male with history of residual coronary artery disease after CABG presenting to the emergency department for shortness of breath this began yesterday morning.  Reports gradually getting worse and acutely woke him up in the middle of the night last night.  Wife tried to get him to come to the emergency department in the middle the night but he declined.  Shortness of breath is present at aspect it is acutely worse with ambulation.        Past Medical History:  Diagnosis Date  . Atrial premature complexes   . CAD (coronary artery disease), native coronary artery cardiologist-- dr Raliegh Ip. nelson   hx NSTEMI 09-24-2015  s/p  CABG x5 on 10-01-2017;  post op STEMI inferolateral wall,  SVG OM1 and SVG OM2 occluded, distal OM occlusion the calpruit, treated medically  . CKD (chronic kidney disease), stage III   . Erectile dysfunction   . Esophageal reflux   . History of atrial fibrillation    post op CABG 10-02-2015  . History of kidney stones   . History of non-ST elevation myocardial infarction (NSTEMI) 09/24/2015   s/p  CABG x5  . History of ST elevation myocardial infarction (STEMI) 10/22/2015   inferior wall,  post op CABG 10-02-2015  . Hyperlipidemia   . Hypertension   . Kidney stone    h/o  . Left ureteral stone   . Mild atherosclerosis of both carotid arteries   . Nephrolithiasis    per CT bilateral non-obstructive calculi  . Peripheral artery disease    LE Arterial US 01/2019: R PTA and ATA occluded; L ATA occluded  . RBBB (right bundle branch block)   . Renal atrophy, right   . Sleep apnea    wears cpap   . ST elevation myocardial infarction (STEMI) of inferior wall ([place]) 10/22/2015  . Type 2 diabetes mellitus treated with insulin  ([place])    followed by pcp  . Wears glasses     Patient Active Problem List   Diagnosis Date Noted  . OSA on CPAP 03/07/2019  . Peripheral artery disease   . CKD (chronic kidney disease) stage 4, GFR 15-29 ml/min ([name]) 11/29/2018  . Hypersomnia with sleep apnea 11/29/2018  . Snoring 11/29/2018  . Intractable vascular headache 11/11/2018  . CAD (coronary artery disease) of artery bypass graft   . 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 Elevated troponin   . CAD (coronary artery disease), native coronary artery 09/24/2015  . Acute renal failure (ARF) ([place]) 09/24/2015  . Diabetes mellitus type 2, noninsulin dependent ([place]) 03/01/2008  . Hyperlipidemia, unspecified   . Essential hypertension   . History of kidney stones     Past Surgical History:  Procedure Laterality Date  . APPENDECTOMY  1965  . CARDIAC CATHETERIZATION N/A 09/26/2015   Procedure: Left Heart Cath and Coronary Angiography;  Surgeon: Troy Sine, MD;  Location: [facility];  Service: Cardiovascular;  Laterality: N/A;  . CARDIAC CATHETERIZATION N/A 10/22/2015   Procedure: Left Heart Cath and Coronary Angiography;  Surgeon: Sherren Mocha, MD;  Location: [facility];  Service: Cardiovascular;  Laterality: N/A;  . CATARACT EXTRACTION W/ INTRAOCULAR LENS  IMPLANT, BILATERAL  2017  . COLONOSCOPY    .  CORONARY ARTERY BYPASS GRAFT N/A 10/02/2015   Procedure: CORONARY ARTERY BYPASS GRAFTING (CABG) X5 LIMA-LAD; SVG-DIAG; SVG-OM; SVG-PD; SVG-RAMUS TRANSESOPHAGEAL ECHOCARDIOGRAM (TEE) ENDOSCOPIC GREATER SAPHENOUS VEIN HARVEST BILAT LE;  Surgeon: Ivin Poot, MD;  Location: [facility];  Service: Open Heart Surgery;  Laterality: N/A;  . CYSTOSCOPY/URETEROSCOPY/HOLMIUM LASER/STENT PLACEMENT Left 08/10/2018   Procedure: CYSTOSCOPY/URETEROSCOPY/HOLMIUM LASER/STENT PLACEMENT;  Surgeon: Festus Aloe, MD;  Location: [facility];  Service: Urology;  Laterality: Left;  . CYSTOSCOPY/URETEROSCOPY/HOLMIUM LASER/STENT  PLACEMENT Left 09/10/2018   Procedure: CYSTOSCOPY/URETEROSCOPY/HOLMIUM LASER/STENT EXCHANGE;  Surgeon: Festus Aloe, MD;  Location: [facility];  Service: Urology;  Laterality: Left;  . LEFT HEART CATHETERIZATION WITH CORONARY ANGIOGRAM N/A 04/13/2014   Procedure: LEFT HEART CATHETERIZATION WITH CORONARY ANGIOGRAM;  Surgeon: Jacolyn Reedy, MD;  Location: [facility];  Service: Cardiovascular;  Laterality: N/A;  . LEG SURGERY Right age 73   closed reduction leg fracture  . POLYPECTOMY    . TEE WITHOUT CARDIOVERSION N/A 10/02/2015   Procedure: TRANSESOPHAGEAL ECHOCARDIOGRAM (TEE);  Surgeon: Ivin Poot, MD;  Location: [facility];  Service: Open Heart Surgery;  Laterality: N/A;  . URETEROSCOPY WITH HOLMIUM LASER LITHOTRIPSY Bilateral 2004;  2005  dr grapey  @[facility]   . VASECTOMY         Family History  Problem Relation Age of Onset  . Heart attack Mother   . Heart attack Father   . Diabetes Brother   . Pancreatic cancer Brother   . Diabetes Brother   . Colon cancer Neg Hx   . Esophageal cancer Neg Hx   . Prostate cancer Neg Hx   . Rectal cancer Neg Hx   . Stomach cancer Neg Hx   . Colon polyps Neg Hx     Social History   Tobacco Use  . Smoking status: Never Smoker  . Smokeless tobacco: Never Used  Substance Use Topics  . Alcohol use: Not Currently  . Drug use: Never    Home Medications Prior to Admission medications   Medication Sig Start Date End Date Taking? Authorizing Provider  aspirin 81 MG chewable tablet Chew 1 tablet (81 mg total) by mouth daily. 10/25/15  Yes Jacolyn Reedy, MD  atorvastatin (LIPITOR) 40 MG tablet Take 40 mg by mouth every morning.    Yes [provider]  cilostazol (PLETAL) 50 MG tablet Take 1 tablet (50 mg total) by mouth 2 (two) times daily. 02/25/19  Yes Lorretta Harp, MD  DULoxetine (CYMBALTA) 30 MG capsule Take 30 mg by mouth at bedtime.   Yes [provider]  DULoxetine (CYMBALTA) 60 MG capsule Take 60 mg by mouth daily.   04/12/18  Yes [provider]  glimepiride (AMARYL) 4 MG tablet Take 4 mg by mouth 2 (two) times daily.    Yes [provider]  Insulin Glargine (BASAGLAR KWIKPEN) 100 UNIT/ML SOPN Inject 31 Units into the skin daily.    Yes [provider]  linagliptin (TRADJENTA) 5 MG TABS tablet Take 30 mg by mouth daily.    Yes [provider]  Melatonin 3 MG TABS Take 1 tablet by mouth daily as needed.   Yes [provider]  metoprolol succinate (TOPROL-XL) 25 MG 24 hr tablet TAKE 1/2 TABLET BY MOUTH EVERY DAY Patient taking differently: Take 12.5 mg by mouth daily.  03/17/19  Yes Dorothy Spark, MD  Multiple Vitamin (MULTIVITAMIN) tablet Take 1 tablet by mouth daily.   Yes [provider]  nitroGLYCERIN (NITROSTAT) 0.4 MG SL tablet Place  0.4 mg under the tongue every 5 (five) minutes as needed for chest pain.   Yes [provider]  ondansetron (ZOFRAN-ODT) 4 MG disintegrating tablet Take 4 mg by mouth every 8 (eight) hours as needed for nausea or vomiting.    Yes [provider]  ONETOUCH ULTRA test strip FOR USE OF [place] BLOOD ONCE DAILY 03/18/19  Yes [provider]  pantoprazole (PROTONIX) 40 MG tablet Take 1 tablet (40 mg total) by mouth daily. 05/31/19  Yes [name], MD  pioglitazone (ACTOS) 45 MG tablet Take 45 mg by mouth daily.   Yes [provider]  valsartan (DIOVAN) 160 MG tablet Take 160 mg by mouth daily. 10/31/19  Yes [provider]  B-D UF III MINI PEN NEEDLES 31G X 5 MM MISC USE AS DIRECTED WITH LANTUS SOLOSTAR 04/02/19   [provider]    Allergies    Victoza [liraglutide], Levaquin [levofloxacin], and Lisinopril  Review of Systems   Review of Systems  Constitutional: Negative for appetite change, fatigue and fever.  HENT: Negative for congestion.   Respiratory: Positive for shortness of breath. Negative for cough.   Cardiovascular: Positive for leg swelling. Negative for  chest pain.  Gastrointestinal: Negative for abdominal pain, diarrhea, nausea and vomiting.  Genitourinary: Negative for dysuria.  Musculoskeletal: Negative for back pain.  Skin: Negative for rash.  All other systems reviewed and are negative.   Physical Exam Updated Vital Signs BP (!) 187/71   Pulse 77   Temp 98.2 F (36.8 C) (Oral)   Resp (!) 26   Ht 5\' 4"  (1.626 m)   Wt 98.8 kg   SpO2 95%   BMI 37.39 kg/m   Physical Exam Vitals and nursing note reviewed.  Constitutional:      Appearance: Normal appearance.  HENT:     Head: Normocephalic.  Eyes:     Conjunctiva/sclera: Conjunctivae normal.     Pupils: Pupils are equal, round, and reactive to light.  Cardiovascular:     Rate and Rhythm: Normal rate.  Pulmonary:     Effort: Pulmonary effort is normal.     Breath sounds: Normal breath sounds.  Chest:     Chest wall: No mass or deformity.  Musculoskeletal:     Right lower leg: Edema present.     Left lower leg: Edema present.  Skin:    General: Skin is dry.     Capillary Refill: Capillary refill takes less than 2 seconds.  Neurological:     Mental Status: He is alert.  Psychiatric:        Mood and Affect: Mood normal.     ED Results / Procedures / Treatments   Labs (all labs ordered are listed, but only abnormal results are displayed) Labs Reviewed  BASIC METABOLIC PANEL - Abnormal; Notable for the following components:      Result Value   Glucose, Bld 102 (*)    Creatinine, Ser 1.81 (*)    GFR calc non Af Amer 36 (*)    GFR calc Af Amer 41 (*)    All other components within normal limits  BRAIN NATRIURETIC PEPTIDE - Abnormal; Notable for the following components:   B Natriuretic Peptide 131.0 (*)    All other components within normal limits  CBG MONITORING, ED - Abnormal; Notable for the following components:   Glucose-Capillary 41 (*)    All other components within normal limits  CBC  D-DIMER, QUANTITATIVE (NOT AT [facility])  CBG MONITORING, ED  TROPONIN I (HIGH SENSITIVITY)  TROPONIN I (HIGH SENSITIVITY)    EKG EKG Interpretation  Date/Time:  Friday November 18 2019 11:21:50 EST Ventricular Rate:  80 PR Interval:  176 QRS Duration: 124 QT Interval:  420 QTC Calculation: 484 R Axis:   21 Text Interpretation: Sinus rhythm with marked sinus arrhythmia Right bundle branch block Abnormal ECG overall similar to previous Confirmed by Theotis Burrow [phone]) on 11/18/2019 2:07:21 PM   Radiology DG Chest 2 View  Result Date: 11/18/2019 CLINICAL DATA:  Shortness of breath EXAM: CHEST - 2 VIEW COMPARISON:  12/01/2016 FINDINGS: Prior CABG. The heart size and mediastinal contours are within normal limits. No focal airspace consolidation, pleural effusion, or pneumothorax. The visualized skeletal structures are unremarkable. IMPRESSION: No active cardiopulmonary disease. Electronically Signed   By: Davina Poke D.O.   On: 11/18/2019 11:45    Procedures Procedures (including critical care time)  Medications Ordered in ED Medications  sodium chloride flush (NS) 0.9 % injection 3 mL (has no administration in time range)    ED Course  I have reviewed the triage vital signs and the nursing notes.  Pertinent labs & imaging results that were available during my care of the patient were reviewed by me and considered in my medical decision making (see chart for details).  Clinical Course as of Nov 18 1554  Fri Nov 18, 2019  1529 73 presenting with worsening SOB over one month but acutely worse last night. Exertional and present at rest. No CP. Patient ambulated with complaints of SOB and oxygen sat 93%. Labs reassuring and do not show signs of ACS. However, given patient hx of CAD, will consult with cardiology ? Atypical ACS symptoms.    [KM]  [id number] Cardiology to consult on patient. Care passed to Resident due to change of shift, patient stable.    [KM]    Clinical Course User Index [KM] Kristine Royal   MDM  Rules/Calculators/A&P                       Final Clinical Impression(s) / ED Diagnoses Final diagnoses:  None    Rx / DC Orders ED Discharge Orders    None       Alveria Apley, [name] 11/18/19 1556    Kristine Royal 11/22/19 1615    Little, Wenda Overland, MD 11/27/19 [phone]

## 2019-11-18 NOTE — Telephone Encounter (Addendum)
Will forward this message back to Dr. Meda Coffee as a general FYI.   Pt went to PCP first and they did an EKG on the pt and advised him to go to [facility] ER via EMS, but he refused and had his wife drive him via private vehicle. Noted pt is currently at [facility] ER now.

## 2019-11-18 NOTE — Telephone Encounter (Signed)
Wife calling to let Fernando Hess and Dr. Meda Coffee know that they went to the patients PCP first to have an EKG done. EKG did show an additional heart problem. They are now at [facility] ED.

## 2019-11-18 NOTE — ED Notes (Signed)
Pt ambulated with pulse ox. O2 was 93% on RA while ambulating from bathroom to room. Pt states "I am SOB."

## 2019-11-18 NOTE — Telephone Encounter (Signed)
Pts wife is calling into the office to report to Dr. Meda Coffee that she thinks taking the pt to the ER, for further evaluation of new onset SOB in the middle of the night, is a good idea. Wife states pt refused when she suggested this in the middle of the night, but she wanted to get further direction from our office on this, to see if we agree with this plan. Wife reports that the pt woke up in the middle of the night stating he could not breath well at all, even with his sleep device.  Wife states he sat hunched over the bed stating "I feel like I just can't catch a good breath of air."  At that time the wife tried to get him to go to the ER, and he refused to go.  Wife states he finally got in his lazy boy chair and slept for around 2 hours. Wife states the pt has no chest pain with his sob. Pt does have DOE, and can't walk very far without taking frequent breaks. Wife also reports he has been falling a lot over the last couple of weeks, due to shuffling his gait when he walks. Wife is also very concerned about the pts bowel movements, for they can't get that regulated, causing him to have diarrhea for weeks now.  Wife also states she thinks the pts sleep device isn't working well at all, and needs to be further adjusted.   Wife states he has no noticeable lower extremity edema or increased weight gain over the last 5 days.  Wife states he has no complaints of chest pain, dizziness, blurred vision, headache, pre-syncopal or syncopal episodes.  Wife states pt has been fully vaccinated and really feels strongly that he needs to go to the ER for further evaluation, given his multiple complaints, and given his strong cardiac history of CABG X 5.  Wife feels there needs to be multiple systems addressed and if we agree, she thinks she should call EMS or drive him to [facility] ER for further evaluation of this. Endorsed to the pts wife that I completely agree with her plan, and she should call EMS now, to have the pt  transported to [facility] ER for further assessment. Informed the pts wife that I will route this message to Dr. Meda Coffee to make her aware of this plan. Wife verbalized understanding and agrees with this plan.  Wife states she is calling EMS now and they will be going to [facility] ER.  Wife gracious for all the support and assistance provided.

## 2019-11-18 NOTE — ED Triage Notes (Signed)
Pt endorses SOB since yesterday. Sent from [facility] due to abnormal EKG. Denies CP.

## 2019-11-19 ENCOUNTER — Observation Stay (HOSPITAL_BASED_OUTPATIENT_CLINIC_OR_DEPARTMENT_OTHER): Payer: Medicare Other

## 2019-11-19 DIAGNOSIS — I340 Nonrheumatic mitral (valve) insufficiency: Secondary | ICD-10-CM

## 2019-11-19 DIAGNOSIS — I5033 Acute on chronic diastolic (congestive) heart failure: Secondary | ICD-10-CM | POA: Diagnosis not present

## 2019-11-19 DIAGNOSIS — I351 Nonrheumatic aortic (valve) insufficiency: Secondary | ICD-10-CM | POA: Diagnosis not present

## 2019-11-19 LAB — BASIC METABOLIC PANEL
Anion gap: 11 (ref 5–15)
BUN: 28 mg/dL — ABNORMAL HIGH (ref 8–23)
CO2: 26 mmol/L (ref 22–32)
Calcium: 9 mg/dL (ref 8.9–10.3)
Chloride: 101 mmol/L (ref 98–111)
Creatinine, Ser: 2 mg/dL — ABNORMAL HIGH (ref 0.61–1.24)
GFR calc Af Amer: 37 mL/min — ABNORMAL LOW (ref >60)
GFR calc non Af Amer: 32 mL/min — ABNORMAL LOW (ref >60)
Glucose, Bld: 177 mg/dL — ABNORMAL HIGH (ref 70–99)
Potassium: 4 mmol/L (ref 3.5–5.1)
Sodium: 138 mmol/L (ref 135–145)

## 2019-11-19 LAB — GLUCOSE, CAPILLARY
Glucose-Capillary: 173 mg/dL — ABNORMAL HIGH (ref 70–99)
Glucose-Capillary: 239 mg/dL — ABNORMAL HIGH (ref 70–99)
Glucose-Capillary: 203 mg/dL — ABNORMAL HIGH (ref 70–99)
Glucose-Capillary: 169 mg/dL — ABNORMAL HIGH (ref 70–99)

## 2019-11-19 LAB — ECHOCARDIOGRAM COMPLETE
Height: 66 in
Weight: 3392 oz

## 2019-11-19 LAB — HEMOGLOBIN A1C
Hgb A1c MFr Bld: 8.7 % — ABNORMAL HIGH (ref 4.8–5.6)
Mean Plasma Glucose: 202.99 mg/dL

## 2019-11-19 MED ORDER — FUROSEMIDE 10 MG/ML IJ SOLN
40.0000 mg | Freq: Once | INTRAMUSCULAR | Status: AC
  Administered 2019-11-19: 40 mg via INTRAVENOUS
  Filled 2019-11-19: qty 4

## 2019-11-19 MED ORDER — ALBUTEROL SULFATE (2.5 MG/3ML) 0.083% IN NEBU: 3.0000 mL | INHALATION_SOLUTION | RESPIRATORY_TRACT | Status: DC | PRN

## 2019-11-19 MED ORDER — INSULIN GLARGINE 100 UNIT/ML ~~LOC~~ SOLN
31.0000 [IU] | Freq: Every day | SUBCUTANEOUS | Status: DC
  Administered 2019-11-19 – 2019-11-20 (×2): 31 [IU] via SUBCUTANEOUS
  Filled 2019-11-19 (×2): qty 0.31

## 2019-11-19 NOTE — Plan of Care (Signed)

## 2019-11-19 NOTE — Progress Notes (Signed)
  Echocardiogram 2D Echocardiogram has been performed.  Kameisha Malicki G Meline Russaw 11/19/2019, 10:22 AM

## 2019-11-19 NOTE — Care Management Obs Status (Signed)
[facility] NOTIFICATION   Patient Details  Name: Fernando Hess MRN: [id number] Date of Birth: [birth year]   Medicare Observation Status Notification Given:  (P) Yes(Pt refused to sign Letter)    Royston Bake, RN 11/19/2019, 3:24 PM

## 2019-11-19 NOTE — Progress Notes (Addendum)
Progress Note  Patient Name: Fernando Hess Date of Encounter: 11/19/2019  Primary Cardiologist: Ena Dawley, MD  Subjective   Notes good response to lasix but says that he remains very short of breath.  Didn't sleep well - between frequent interruptions by staff and feeling short of breath when lying down.  He says that over the past 2-3 wks, he has been very dyspneic when walking up steps in his home.  This has been assoc w/ wheezing, which he has never had before.  Inpatient Medications    Scheduled Meds: . aspirin  81 mg Oral Daily  . atorvastatin  40 mg Oral q morning - 10a  . cilostazol  50 mg Oral BID  . DULoxetine  30 mg Oral QHS  . DULoxetine  60 mg Oral Daily  . enoxaparin (LOVENOX) injection  40 mg Subcutaneous Q24H  . insulin aspart  0-15 Units Subcutaneous TID WC  . metoprolol succinate  12.5 mg Oral Daily  . pantoprazole  40 mg Oral Daily  . sodium chloride flush  3 mL Intravenous Once   Continuous Infusions:  PRN Meds: acetaminophen, nitroGLYCERIN, ondansetron (ZOFRAN) IV, ondansetron   Vital Signs    Vitals:   11/18/19 1909 11/18/19 2054 11/19/19 0219 11/19/19 0607  BP:  (!) 141/97 129/78 132/84  Pulse:  99 96 77  Resp:  16 16 18   Temp:  97.9 F (36.6 C)  98.6 F (37 C)  TempSrc:  Oral  Oral  SpO2: 94% 93% 96% 94%  Weight:    96.2 kg  Height:        Intake/Output Summary (Last 24 hours) at 11/19/2019 1018 Last data filed at 11/19/2019 0700 Gross per 24 hour  Intake 200 ml  Output 1550 ml  Net -1350 ml   Filed Weights   11/18/19 1125 11/18/19 1853 11/19/19 0607  Weight: 98.8 kg 96.4 kg 96.2 kg    Physical Exam   GEN: Well nourished, well developed, in no acute distress.  HEENT: Grossly normal.  Neck: Supple, no JVD, carotid bruits, or masses. Cardiac: RRR, 3/6 syst murmur @ the upper sternal borders and heard throughout, no rubs, or gallops. No clubbing, cyanosis, edema.  Radials/DP/PT 2+ and equal bilaterally.  Respiratory:   Respirations regular and unlabored, clear to auscultation bilaterally.  I do not appreciate any wheezing this AM. GI: Soft, nontender, nondistended, BS + x 4. MS: no deformity or atrophy. Skin: warm and dry, no rash. Neuro:  Strength and sensation are intact. Psych: AAOx3.  Normal affect.  Labs    Chemistry Recent Labs  Lab 11/18/19 1139 11/19/19 0523  NA 142 138  K 4.5 4.0  CL 107 101  CO2 26 26  GLUCOSE 102* 177*  BUN 21 28*  CREATININE 1.81* 2.00*  CALCIUM 9.3 9.0  GFRNONAA 36* 32*  GFRAA 41* 37*  ANIONGAP 9 11     Hematology Recent Labs  Lab 11/18/19 1139  WBC 8.4  RBC 4.51  HGB 13.4  HCT 42.4  MCV 94.0  MCH 29.7  MCHC 31.6  RDW 14.5  PLT 262    Cardiac Enzymes  Recent Labs  Lab 11/18/19 1139 11/18/19 1322  TROPONINIHS 11 12      BNP Recent Labs  Lab 11/18/19 1300  BNP 131.0*     DDimer  Recent Labs  Lab 11/18/19 1359  DDIMER 0.40     Radiology    DG Chest 2 View  Result Date: 11/18/2019 CLINICAL DATA:  Shortness of breath  EXAM: CHEST - 2 VIEW COMPARISON:  12/01/2016 FINDINGS: Prior CABG. The heart size and mediastinal contours are within normal limits. No focal airspace consolidation, pleural effusion, or pneumothorax. The visualized skeletal structures are unremarkable. IMPRESSION: No active cardiopulmonary disease. Electronically Signed   By: Davina Poke D.O.   On: 11/18/2019 11:45    Telemetry    Sinus rhythm, PACs, sinus arrhythmia - Personally Reviewed  Cardiac Studies   2D Echocardiogram pending this AM  Patient Profile     Fernando Hess is a 74 y.o. male with a history of CAD s/p CABG in 09/2015 and then inferior STEMI in 10/2015 secondary to total occlusion of SVG to OM1 and SVG to OM2, PAD with dopplers in 01/2019 showing primarily tibial vessel disease on Pletal, hypertension, hyperlipidemia, type 2 diabetes mellitus, sleep apnea on CPAP, and CKD stage III, who was admitted 11/18/2019 w/ dyspnea and hypertension.   Assessment & Plan    1. 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  He was hypertensive in the ED.  CXR w/ edema.  BNP minimally elevated @ 131.  HsTrops nl (11  12).  D dimer wnl @ 0.40.  COVID19 neg.  H/H wnl.  He was given a dose of IV lasix in the ED and is minus 1.3 L since admission.  BUN/Creat up from 21/1.81 to 28/2.0 this AM.  As noted above, he says that he has been wheezing during periods of dyspnea over the past few wks.  He is not wheezing this AM.  Afebrile. WBC wnl.  No prior h/o COPD or tob abuse.  I will add albuterol prn.  As he appears euvolemic on exam and w/ rise in BUN/Creat this AM, I will not give any more lasix at this time.  Await echo.  2.  CAD:  S/p CABG x 5 in 09/2015 followed by STEMI in 10/2015 w/ occlusions of the VG  OM1 and VG  OM2.  He has been medically managed since.  He has not been having c/p.  HsTrop nl (11  12).  Await echo.  May need ischemic eval.  Cont asa,  blocker, and statin rx.  3.  Hypertensive urgency:  BP 181/71 in the ED.  Now trending in the 120's to 130's.  Currently only on toprol xl 12.5mg  daily.  Was also on valsartan 160mg  daily @ home.  With slight rise in creat this AM, will hold off on resuming ARB @ this time, but would plan to resume prior to discharge.  4.  DMII:  Hypoglycemic in the ED w/ sugars in the low 40's.  Home meds (pioglitazone, glimepiride, and lingagliptin - recently started) currently on hold.  Glucose up to 325 last night.  173 this AM.  A1c 8.7.  Cont SSI. Will resume lantus for now.  5.  CKD III:  BUN/Creat up from 21/1.81 on admission to 28/2.0 this AM following IV lasix yesterday.  6.  Systolic murmur: Prior h/o mild to mod MR.  Murmur on exam more suggestive of AS.  Await echo.  7.  PAD:  Dopplers 01/2019 w/ occluded R PT/AT and L AT.  No claudication.  Cont asa, pletal, statin.  Signed, Murray Hodgkins, NP  11/19/2019, 10:18  AM    For questions or updates, please contact   Please consult [email or web address] for contact info under Cardiology/STEMI.   As above, patient seen and examined.  Patient continues  to describe dyspnea on exertion.  He does not have clear orthopnea but states he has gained 10 pounds in the past 2 months.  He has minimal pedal edema.  BNP not significantly elevated.  D-dimer normal.  Chest x-ray negative.  May be a component of volume excess.  We will give Lasix 40 mg IV today.  Follow renal function closely given baseline renal insufficiency.  Echocardiogram is pending but preliminary look shows preserved LV function. Kirk Ruths

## 2019-11-20 ENCOUNTER — Other Ambulatory Visit: Payer: Self-pay | Admitting: Nurse Practitioner

## 2019-11-20 DIAGNOSIS — I5032 Chronic diastolic (congestive) heart failure: Secondary | ICD-10-CM

## 2019-11-20 DIAGNOSIS — I5033 Acute on chronic diastolic (congestive) heart failure: Secondary | ICD-10-CM | POA: Diagnosis not present

## 2019-11-20 LAB — BASIC METABOLIC PANEL
Anion gap: 11 (ref 5–15)
BUN: 36 mg/dL — ABNORMAL HIGH (ref 8–23)
CO2: 27 mmol/L (ref 22–32)
Calcium: 9.2 mg/dL (ref 8.9–10.3)
Chloride: 102 mmol/L (ref 98–111)
Creatinine, Ser: 2.18 mg/dL — ABNORMAL HIGH (ref 0.61–1.24)
GFR calc Af Amer: 33 mL/min — ABNORMAL LOW (ref >60)
GFR calc non Af Amer: 29 mL/min — ABNORMAL LOW (ref >60)
Glucose, Bld: 127 mg/dL — ABNORMAL HIGH (ref 70–99)
Potassium: 4 mmol/L (ref 3.5–5.1)
Sodium: 140 mmol/L (ref 135–145)

## 2019-11-20 LAB — GLUCOSE, CAPILLARY: Glucose-Capillary: 134 mg/dL — ABNORMAL HIGH (ref 70–99)

## 2019-11-20 MED ORDER — FUROSEMIDE 20 MG PO TABS: 20.0000 mg | ORAL_TABLET | Freq: Every day | ORAL | 6 refills | Status: DC

## 2019-11-20 MED ORDER — FUROSEMIDE 20 MG PO TABS
20.0000 mg | ORAL_TABLET | Freq: Every day | ORAL | Status: DC
  Administered 2019-11-20: 20 mg via ORAL
  Filled 2019-11-20: qty 1

## 2019-11-20 NOTE — Progress Notes (Signed)
Patient discharged: Home with family  Via: Wheelchair   Discharge paperwork given: to patient and family  Reviewed with teach back  IV and telemetry disconnected  Belongings given to patient    

## 2019-11-20 NOTE — Discharge Summary (Signed)
Discharge Summary    Patient ID: Fernando Hess MRN: [id number]; DOB: 07-30-1946  Admit date: 11/18/2019 Discharge date: 11/20/2019  Primary Care Provider: [name], MD  Primary Cardiologist: Fernando Dawley, MD  Primary Electrophysiologist:  None   Discharge Diagnoses    Principal Problem:   Acute on chronic diastolic CHF (congestive heart failure) ([place]) Active Problems:   CAD (coronary artery disease), native coronary artery   S/P CABG x 5   Type II diabetes mellitus ([place])   Essential hypertension   CKD (chronic kidney disease) stage 4, GFR 15-29 ml/min ([name])   Hyperlipidemia, unspecified   Peripheral artery disease   OSA on CPAP   Diagnostic Studies/Procedures    2D Echocardiogram 3.6.2021   1. Normal LV systolic function; moderate LVH; grade 1 diastolic  dysfunction; mildly dilated ascending aorta; mild AI and MR.   2. Left ventricular ejection fraction, by estimation, is 60 to 65%. The  left ventricle has normal function. The left ventricle has no regional  wall motion abnormalities. There is moderate left ventricular hypertrophy.  Left ventricular diastolic  parameters are consistent with Grade I diastolic dysfunction (impaired  relaxation).   3. Right ventricular systolic function is normal. The right ventricular  size is normal.   4. The mitral valve is normal in structure and function. Mild mitral  valve regurgitation. No evidence of mitral stenosis.   5. The aortic valve is normal in structure and function. Aortic valve  regurgitation is mild. Mild to moderate aortic valve  sclerosis/calcification is present, without any evidence of aortic  stenosis.   6. Aortic dilatation noted. There is mild dilatation of the ascending  aorta measuring 39 mm.   7. The inferior vena cava is normal in size with greater than 50%  respiratory variability, suggesting right atrial pressure of 3 mmHg.  _____________   History of Present Illness     Fernando Hess is  a 74 y.o. male with a h/o CAD status post prior coronary artery bypass grafting, peripheral arterial disease, hypertension, hyperlipidemia, chronic diastolic congestive heart failure, type 2 diabetes mellitus, obstructive sleep apnea on CPAP, obesity, and stage III-IV chronic kidney disease.  As noted, he underwent CABG x5 in January 2017 and 1 month later suffered inferior ST segment elevation MI with catheterization revealing occlusions to the vein graft-OM1 and vein graft-OM2.  He has been medically managed since.  He was in his usual state of health until approximately 2 to 3 weeks prior to admission, when he began to experience worsening rest and exertional dyspnea.  Symptoms worsened on the day prior to admission and included orthopnea.  Patient is also noted a 10 pound weight gain over a 1 to 3-month period.  He presented to the emergency department on March 5 where he was hypertensive at 183/80.  EKG showed no acute ST or T changes.  High-sensitivity troponin was normal x2.  BNP was minimally elevated at 131.  D-dimer was normal.  Chest x-ray showed no acute findings.  Creatinine was at his baseline of 1.81.  He was treated with Lasix 40 mg IV x1 and admitted for further evaluation.  Of note, patient's glucose was in the low 40s in the emergency department.  He reported recent initiation of linagliptin therapy and this was held throughout admission.  Hospital Course     Consultants: None  Patient ruled out for myocardial infarction.  Despite initial good diuresis with 1.5 L out between March 5 and March 6, patient did not notice  any significant change in his baseline level of dyspnea.  Body habitus made exam challenging however, he did not appear to be particularly volume overloaded.  Echocardiogram showed an EF of 60-65% with grade 1 diastolic dysfunction, mild aortic insufficiency and mild mitral regurgitation.  We gave him an additional dose of Lasix 40 mg IV on March 6.  For this admission, he is  -2 L with a reduction in weight from 96.4 kg admission to 95.3 kg at discharge.  This morning, he has been ambulating without recurrent dyspnea and feels ready for discharge.  With diuresis, he has had a slight rise in his creatinine to 2.18 this morning.  In that setting, his home dose of ARB therapy has been on hold.  We have made arrangements for follow-up basic metabolic panel on Thursday, March 11.  As noted above, we initially held his diabetic medications including Lantus.  He was maintained on sliding scale insulin and Lantus was resumed on March 6. He has not had any recurrent hypoglycemia since admission.  Pts wife called this AM to report that over the past month, Fernando Hess has been having persistent diaphoresis during the day while at home.  This has come w/ unsteadiness of gait and occasional falls.  She has checked his blood glucose during these periods and has found it to frequently be in the 60's.  They have not reported this to his PCP.  He has f/u with his PCP on Monday 3/7, and we have advised that they discuss his diabetes regimen and what sounds like symptomatic hypoglycemia with Fernando Hess.  Did the patient have an acute coronary syndrome (MI, NSTEMI, STEMI, etc) this admission?:  No                               Did the patient have a percutaneous coronary intervention (stent / angioplasty)?:  No.   _____________  Discharge Vitals Blood pressure 112/78, pulse 94, temperature 98.4 F (36.9 C), temperature source Oral, resp. rate 16, height 5\' 6"  (1.676 m), weight 95.3 kg, SpO2 94 %.  Filed Weights   11/18/19 1853 11/19/19 0607 11/20/19 0524  Weight: 96.4 kg 96.2 kg 95.3 kg    Labs & Radiologic Studies    CBC Recent Labs    11/18/19 1139  WBC 8.4  HGB 13.4  HCT 42.4  MCV 94.0  PLT 962   Basic Metabolic Panel Recent Labs    11/19/19 0523 11/20/19 0332  NA 138 140  K 4.0 4.0  CL 101 102  CO2 26 27  GLUCOSE 177* 127*  BUN 28* 36*  CREATININE 2.00* 2.18*    CALCIUM 9.0 9.2   High Sensitivity Troponin:   Recent Labs  Lab 11/18/19 1139 11/18/19 1322  TROPONINIHS 11 12    BNP 131  D-Dimer Recent Labs    11/18/19 1359  DDIMER 0.40   Hemoglobin A1C Recent Labs    11/19/19 0523  HGBA1C 8.7*  _____________  DG Chest 2 View  Result Date: 11/18/2019 CLINICAL DATA:  Shortness of breath EXAM: CHEST - 2 VIEW COMPARISON:  12/01/2016 FINDINGS: Prior CABG. The heart size and mediastinal contours are within normal limits. No focal airspace consolidation, pleural effusion, or pneumothorax. The visualized skeletal structures are unremarkable. IMPRESSION: No active cardiopulmonary disease. Electronically Signed   By: Davina Poke D.O.   On: 11/18/2019 11:45   Disposition   Pt is being discharged home today in good condition.  Follow-up Plans & Appointments    Follow-up Information     Liliane Shi, [name] Follow up on 12/02/2019.   Specialties: Cardiology, Physician Assistant Why: 8:45 AM Contact information: [address] [address] [phone]         [facility] On 11/24/2019.   Why: We'd like for you to come to our lab (8a-3p) for a blood chemistry so that we may follow-up your kidney function. Contact information: [place] [address] [phone]        Fernando Cruel, MD Follow up.   Specialty: Family Medicine Why: Please follow up in a week Contact information: [place] [address] [phone]           Discharge Instructions     ([place]) Call MD:  Anytime you have any of the following symptoms: 1) 3 pound weight gain in 24 hours or 5 pounds in 1 week 2) shortness of breath, with or without a dry hacking cough 3) swelling in the hands, feet or stomach 4) if you have to sleep on extra pillows at night in order to breathe.   Complete by: As  directed    Call MD for:  difficulty breathing, headache or visual disturbances   Complete by: As directed    Call MD for:  persistant dizziness or light-headedness   Complete by: As directed    Diet - low sodium heart healthy   Complete by: As directed    Increase activity slowly   Complete by: As directed        Discharge Medications   Allergies as of 11/20/2019       Reactions   Victoza [liraglutide]    Severe fatigue & insomnia   Levaquin [levofloxacin] Itching, Rash   Lisinopril Itching, Rash        Medication List     STOP taking these medications    linagliptin 5 MG Tabs tablet Commonly known as: TRADJENTA   valsartan 160 MG tablet Commonly known as: DIOVAN       TAKE these medications    aspirin 81 MG chewable tablet Chew 1 tablet (81 mg total) by mouth daily.   atorvastatin 40 MG tablet Commonly known as: LIPITOR Take 40 mg by mouth every morning.   B-D UF III MINI PEN NEEDLES 31G X 5 MM Misc Generic drug: Insulin Pen Needle USE AS DIRECTED WITH LANTUS SOLOSTAR   Basaglar KwikPen 100 UNIT/ML Inject 31 Units into the skin daily.   cilostazol 50 MG tablet Commonly known as: PLETAL Take 1 tablet (50 mg total) by mouth 2 (two) times daily.   DULoxetine 30 MG capsule Commonly known as: CYMBALTA Take 30 mg by mouth at bedtime.   DULoxetine 60 MG capsule Commonly known as: CYMBALTA Take 60 mg by mouth daily.   furosemide 20 MG tablet Commonly known as: LASIX Take 1 tablet (20 mg total) by mouth daily. Start taking on: November 21, 2019   glimepiride 4 MG tablet Commonly known as: AMARYL Take 4 mg by mouth 2 (two) times daily.   Melatonin 3 MG Tabs Take 1 tablet by mouth daily as needed.   metoprolol succinate 25 MG 24 hr tablet Commonly known as: TOPROL-XL TAKE 1/2 TABLET BY MOUTH EVERY DAY   multivitamin tablet Take 1 tablet by mouth daily.   nitroGLYCERIN 0.4 MG SL tablet Commonly known as: NITROSTAT Place 0.4 mg under the  tongue  every 5 (five) minutes as needed for chest pain.   ondansetron 4 MG disintegrating tablet Commonly known as: ZOFRAN-ODT Take 4 mg by mouth every 8 (eight) hours as needed for nausea or vomiting.   OneTouch Ultra test strip Generic drug: glucose blood FOR USE OF CHEACKING BLOOD ONCE DAILY   pantoprazole 40 MG tablet Commonly known as: PROTONIX Take 1 tablet (40 mg total) by mouth daily.   pioglitazone 45 MG tablet Commonly known as: ACTOS Take 45 mg by mouth daily.         Outstanding Labs/Studies   Follow-up [facility] on Thursday 11/24/2019.  Duration of Discharge Encounter   Greater than 30 minutes including physician time.  Signed, Murray Hodgkins, NP 11/20/2019, 10:52 AM

## 2019-11-20 NOTE — Progress Notes (Signed)
Pt requesting to eat lunch before leaving. Tray provided.

## 2019-11-20 NOTE — Discharge Instructions (Signed)
**  PLEASE REMEMBER TO BRING ALL OF YOUR MEDICATIONS TO EACH OF YOUR FOLLOW-UP OFFICE VISITS.     [employer] Habits of Highly Healthy [place] wants to help you get well and stay well.  Live a longer, healthier life by practicing healthy habits every day.  1.  Visit your primary care provider regularly. 2.  Make time for family and friends.  Healthy relationships are important. 3.  Take medications as directed by your provider. 4.  Maintain a healthy weight and a trim waistline. 5.  Eat healthy meals and snacks, rich in fruits, vegetables, whole grains, and lean proteins. 6.  Get moving every day - aim for 150 minutes of moderate physical activity each week. 7.  Don't smoke. 8.  Avoid alcohol or drink in moderation. 9.  Manage stress through meditation or mindful relaxation. 10.  Get seven to nine hours of quality sleep each night.  Want more information on healthy habits?  To learn more about these and other healthy habits, visit [email or web address]. _____________       Fernando Hess have received care from [facility] during this hospital stay and we look forward to continuing to provide you with excellent care in our office settings after you've left the hospital.  In order to assure a smoother transition to home following your discharge from the hospital, we will likely have you see one of our nurse practitioners or physician assistants within a few weeks of discharge.  Our advanced practice providers work closely with your physician in order to address all of your heart's needs in a timely manner.  More information about all of our providers may be found here: [email or web address]  Please plan to bring all of your prescriptions to your follow-up appointment and don't hesitate to contact us with questions or concerns.  [facility] [place] - [phone] - [place] - [place] - [phone] [facility] [place] - [phone] [facility] - [place] [phone] [place] [phone]

## 2019-11-20 NOTE — Progress Notes (Signed)
Pt ready for d/c, awaiting on ride.

## 2019-11-20 NOTE — Progress Notes (Signed)
Progress Note  Patient Name: Fernando Hess Date of Encounter: 11/20/2019  Primary Cardiologist: Ena Dawley, MD   Subjective   Dyspnea improved this morning.  He denies chest pain.  Inpatient Medications    Scheduled Meds:  aspirin  81 mg Oral Daily   atorvastatin  40 mg Oral q morning - 10a   cilostazol  50 mg Oral BID   DULoxetine  30 mg Oral QHS   DULoxetine  60 mg Oral Daily   enoxaparin (LOVENOX) injection  40 mg Subcutaneous Q24H   insulin aspart  0-15 Units Subcutaneous TID WC   insulin glargine  31 Units Subcutaneous Daily   metoprolol succinate  12.5 mg Oral Daily   pantoprazole  40 mg Oral Daily   sodium chloride flush  3 mL Intravenous Once   Continuous Infusions:  PRN Meds: acetaminophen, albuterol, nitroGLYCERIN, ondansetron (ZOFRAN) IV, ondansetron   Vital Signs    Vitals:   11/19/19 1130 11/19/19 1624 11/19/19 2024 11/20/19 0524  BP: 127/64 (!) 124/91 124/84 138/80  Pulse: 77 78 71 77  Resp: 20 20 16 16   Temp: 98.1 F (36.7 C) 98.3 F (36.8 C) 98.1 F (36.7 C) 98.4 F (36.9 C)  TempSrc: Oral Oral Oral Oral  SpO2: 94% 91% 97% 96%  Weight:    95.3 kg  Height:        Intake/Output Summary (Last 24 hours) at 11/20/2019 0746 Last data filed at 11/19/2019 2000 Gross per 24 hour  Intake 720 ml  Output 900 ml  Net -180 ml   Last 3 Weights 11/20/2019 11/19/2019 11/18/2019  Weight (lbs) 210 lb 3.2 oz 212 lb 212 lb 8 oz  Weight (kg) 95.346 kg 96.163 kg 96.389 kg      Telemetry    Sinus to sinus tachy- Personally Reviewed  Physical Exam   GEN: No acute distress.   Neck: No JVD Cardiac: RRR, no murmurs, rubs, or gallops.  Respiratory: Clear to auscultation bilaterally. GI: Soft, nontender, non-distended  MS: No edema Neuro:  Nonfocal  Psych: Normal affect   Labs    High Sensitivity Troponin:   Recent Labs  Lab 11/18/19 1139 11/18/19 1322  TROPONINIHS 11 12      Chemistry Recent Labs  Lab 11/18/19 1139 11/19/19 0523  11/20/19 0332  NA 142 138 140  K 4.5 4.0 4.0  CL 107 101 102  CO2 26 26 27   GLUCOSE 102* 177* 127*  BUN 21 28* 36*  CREATININE 1.81* 2.00* 2.18*  CALCIUM 9.3 9.0 9.2  GFRNONAA 36* 32* 29*  GFRAA 41* 37* 33*  ANIONGAP 9 11 11      Hematology Recent Labs  Lab 11/18/19 1139  WBC 8.4  RBC 4.51  HGB 13.4  HCT 42.4  MCV 94.0  MCH 29.7  MCHC 31.6  RDW 14.5  PLT 262    BNP Recent Labs  Lab 11/18/19 1300  BNP 131.0*     DDimer  Recent Labs  Lab 11/18/19 1359  DDIMER 0.40     Radiology    DG Chest 2 View  Result Date: 11/18/2019 CLINICAL DATA:  Shortness of breath EXAM: CHEST - 2 VIEW COMPARISON:  12/01/2016 FINDINGS: Prior CABG. The heart size and mediastinal contours are within normal limits. No focal airspace consolidation, pleural effusion, or pneumothorax. The visualized skeletal structures are unremarkable. IMPRESSION: No active cardiopulmonary disease. Electronically Signed   By: Davina Poke D.O.   On: 11/18/2019 11:45   ECHOCARDIOGRAM COMPLETE  Result Date: 11/19/2019  ECHOCARDIOGRAM REPORT   Patient Name:   Fernando Hess Date of Exam: 11/19/2019 Medical Rec #:  [id number]      Height:       66.0 in Accession #:    [id number]     Weight:       212.0 lb Date of Birth:  Jun 07, 1946      BSA:          2.050 m Patient Age:    74 years       BP:           132/84 mmHg Patient Gender: M              HR:           82 bpm. Exam Location:  Inpatient Procedure: 2D Echo, Cardiac Doppler and Color Doppler Indications:    Dyspnea 786.09 / R06.00  History:        Patient has prior history of Echocardiogram examinations, most                 recent 10/23/2015. CAD and Previous Myocardial Infarction, PAD,                 Arrythmias:RBBB and Atrial Fibrillation; Risk                 Factors:Hypertension, Diabetes, Dyslipidemia, Sleep Apnea and                 GERD. CKD.  Sonographer:    Tiffany Dance Referring Phys: [id number] [place]  1. Normal LV systolic function;  moderate LVH; grade 1 diastolic dysfunction; mildly dilated ascending aorta; mild AI and MR.  2. Left ventricular ejection fraction, by estimation, is 60 to 65%. The left ventricle has normal function. The left ventricle has no regional wall motion abnormalities. There is moderate left ventricular hypertrophy. Left ventricular diastolic parameters are consistent with Grade I diastolic dysfunction (impaired relaxation).  3. Right ventricular systolic function is normal. The right ventricular size is normal.  4. The mitral valve is normal in structure and function. Mild mitral valve regurgitation. No evidence of mitral stenosis.  5. The aortic valve is normal in structure and function. Aortic valve regurgitation is mild. Mild to moderate aortic valve sclerosis/calcification is present, without any evidence of aortic stenosis.  6. Aortic dilatation noted. There is mild dilatation of the ascending aorta measuring 39 mm.  7. The inferior vena cava is normal in size with greater than 50% respiratory variability, suggesting right atrial pressure of 3 mmHg. FINDINGS  Left Ventricle: Left ventricular ejection fraction, by estimation, is 60 to 65%. The left ventricle has normal function. The left ventricle has no regional wall motion abnormalities. The left ventricular internal cavity size was normal in size. There is  moderate left ventricular hypertrophy. Left ventricular diastolic parameters are consistent with Grade I diastolic dysfunction (impaired relaxation). Right Ventricle: The right ventricular size is normal. Right ventricular systolic function is normal. Left Atrium: Left atrial size was normal in size. Right Atrium: Right atrial size was normal in size. Pericardium: There is no evidence of pericardial effusion. Mitral Valve: The mitral valve is normal in structure and function. Normal mobility of the mitral valve leaflets. Mild mitral annular calcification. Mild mitral valve regurgitation. No evidence of mitral  valve stenosis. Tricuspid Valve: The tricuspid valve is normal in structure. Tricuspid valve regurgitation is trivial. No evidence of tricuspid stenosis. Aortic Valve: The aortic valve is normal in structure and function. Aortic valve  regurgitation is mild. Aortic regurgitation PHT measures 441 msec. Mild to moderate aortic valve sclerosis/calcification is present, without any evidence of aortic stenosis. Pulmonic Valve: The pulmonic valve was normal in structure. Pulmonic valve regurgitation is not visualized. No evidence of pulmonic stenosis. Aorta: Aortic dilatation noted. There is mild dilatation of the ascending aorta measuring 39 mm. Venous: The inferior vena cava is normal in size with greater than 50% respiratory variability, suggesting right atrial pressure of 3 mmHg. IAS/Shunts: No atrial level shunt detected by color flow Doppler. Additional Comments: Normal LV systolic function; moderate LVH; grade 1 diastolic dysfunction; mildly dilated ascending aorta; mild AI and MR.  LEFT VENTRICLE PLAX 2D LVIDd:         4.00 cm  Diastology LVIDs:         3.20 cm  LV e' medial:   6.31 cm/s LV PW:         1.30 cm  LV E/e' medial: 12.9 LV IVS:        1.40 cm LVOT diam:     1.80 cm LV SV:         56 LV SV Index:   28 LVOT Area:     2.54 cm  RIGHT VENTRICLE          IVC RV Basal diam:  2.80 cm  IVC diam: 1.80 cm TAPSE (M-mode): 1.4 cm LEFT ATRIUM             Index       RIGHT ATRIUM           Index LA diam:        4.50 cm 2.20 cm/m  RA Area:     13.50 cm LA Vol (A2C):   54.6 ml 26.63 ml/m RA Volume:   23.40 ml  11.41 ml/m LA Vol (A4C):   39.2 ml 19.12 ml/m LA Biplane Vol: 46.1 ml 22.49 ml/m  AORTIC VALVE LVOT Vmax:   128.00 cm/s LVOT Vmean:  75.367 cm/s LVOT VTI:    0.222 m AI PHT:      441 msec  AORTA Ao Root diam: 3.90 cm Ao Asc diam:  3.90 cm MITRAL VALVE MV Area (PHT): 3.60 cm    SHUNTS MV Decel Time: 211 msec    Systemic VTI:  0.22 m MV E velocity: 81.20 cm/s  Systemic Diam: 1.80 cm MV A velocity: 97.40 cm/s  MV E/A ratio:  0.83 Kirk Ruths MD Electronically signed by Kirk Ruths MD Signature Date/Time: 11/19/2019/1:22:17 PM    Final     Patient Profile     [name] Canteris a 74 y.o.malewith ahistory of CAD s/p CABG in 09/2015 and then inferior STEMI in 10/2015 secondary to total occlusion of SVG to OM1 and SVG to OM2, PAD with dopplers in 01/2019 showing primarily tibial vessel disease on Pletal, hypertension, hyperlipidemia, type 2 diabetes mellitus, sleep apnea on CPAP, and CKD stage III, who was admitted 11/18/2019 w/ dyspnea and hypertension.  Assessment & Plan    1 dyspnea/acute on chronic diastolic congestive heart failure-patient admitted with worsening dyspnea.  BNP was only minimally elevated and D-dimer was normal.  Minimally volume overloaded on examination.  Chest x-ray showed no edema.  Patient has been treated with low-dose diuretic with improvement.  We will treat with Lasix 20 mg daily.  Ambulate today and if he is stable can discharge on Lasix 20 mg daily with close follow-up of renal function.  Note LV function shows normal LV systolic function with grade 1 diastolic  dysfunction.  If his dyspnea persists might need right heart catheterization to further assess as he is not significantly volume overloaded on examination.  2 coronary artery disease status post coronary artery bypass and graft-troponins are normal.  LV function normal.  Continue aspirin and statin.  3 hypertension-blood pressure controlled this morning.  Continue present medications.  4 chronic stage III kidney disease-renal function slightly worse today.  We will need to check potassium and renal function 5 days following discharge.  For questions or updates, please contact [place] Please consult [email or web address] for contact info under        Signed, Kirk Ruths, MD  11/20/2019, 7:46 AM

## 2019-11-23 ENCOUNTER — Other Ambulatory Visit: Payer: Self-pay

## 2019-11-23 ENCOUNTER — Emergency Department (HOSPITAL_COMMUNITY)
Admission: EM | Admit: 2019-11-23 | Discharge: 2019-11-24 | Disposition: A | Discharge status: Home / Self Care | Payer: Medicare Other | Attending: Emergency Medicine | Admitting: Emergency Medicine

## 2019-11-23 ENCOUNTER — Ambulatory Visit (INDEPENDENT_AMBULATORY_CARE_PROVIDER_SITE_OTHER): Payer: Medicare Other | Admitting: Family Medicine

## 2019-11-23 ENCOUNTER — Emergency Department (HOSPITAL_COMMUNITY): Payer: Medicare Other

## 2019-11-23 ENCOUNTER — Encounter (HOSPITAL_COMMUNITY): Payer: Self-pay | Admitting: Emergency Medicine

## 2019-11-23 ENCOUNTER — Encounter: Payer: Self-pay | Admitting: Family Medicine

## 2019-11-23 VITALS — BP 100/62 | HR 92 | Temp 97.1°F | Ht 66.0 in | Wt 213.8 lb

## 2019-11-23 DIAGNOSIS — I5032 Chronic diastolic (congestive) heart failure: Secondary | ICD-10-CM | POA: Insufficient documentation

## 2019-11-23 DIAGNOSIS — Z9989 Dependence on other enabling machines and devices: Secondary | ICD-10-CM

## 2019-11-23 DIAGNOSIS — I2510 Atherosclerotic heart disease of native coronary artery without angina pectoris: Secondary | ICD-10-CM | POA: Insufficient documentation

## 2019-11-23 DIAGNOSIS — Z7982 Long term (current) use of aspirin: Secondary | ICD-10-CM | POA: Insufficient documentation

## 2019-11-23 DIAGNOSIS — I130 Hypertensive heart and chronic kidney disease with heart failure and stage 1 through stage 4 chronic kidney disease, or unspecified chronic kidney disease: Secondary | ICD-10-CM | POA: Insufficient documentation

## 2019-11-23 DIAGNOSIS — E1122 Type 2 diabetes mellitus with diabetic chronic kidney disease: Secondary | ICD-10-CM | POA: Insufficient documentation

## 2019-11-23 DIAGNOSIS — I5033 Acute on chronic diastolic (congestive) heart failure: Secondary | ICD-10-CM | POA: Diagnosis not present

## 2019-11-23 DIAGNOSIS — I252 Old myocardial infarction: Secondary | ICD-10-CM | POA: Diagnosis not present

## 2019-11-23 DIAGNOSIS — R0602 Shortness of breath: Secondary | ICD-10-CM | POA: Diagnosis present

## 2019-11-23 DIAGNOSIS — G4733 Obstructive sleep apnea (adult) (pediatric): Secondary | ICD-10-CM

## 2019-11-23 DIAGNOSIS — Z79899 Other long term (current) drug therapy: Secondary | ICD-10-CM | POA: Insufficient documentation

## 2019-11-23 DIAGNOSIS — N183 Chronic kidney disease, stage 3 unspecified: Secondary | ICD-10-CM | POA: Insufficient documentation

## 2019-11-23 DIAGNOSIS — Z951 Presence of aortocoronary bypass graft: Secondary | ICD-10-CM | POA: Insufficient documentation

## 2019-11-23 LAB — PROTIME-INR
INR: 1 (ref 0.8–1.2)
Prothrombin Time: 13.3 seconds (ref 11.4–15.2)

## 2019-11-23 LAB — BASIC METABOLIC PANEL
Anion gap: 9 (ref 5–15)
BUN: 31 mg/dL — ABNORMAL HIGH (ref 8–23)
CO2: 27 mmol/L (ref 22–32)
Calcium: 9.2 mg/dL (ref 8.9–10.3)
Chloride: 104 mmol/L (ref 98–111)
Creatinine, Ser: 2.27 mg/dL — ABNORMAL HIGH (ref 0.61–1.24)
GFR calc Af Amer: 32 mL/min — ABNORMAL LOW (ref >60)
GFR calc non Af Amer: 27 mL/min — ABNORMAL LOW (ref >60)
Glucose, Bld: 263 mg/dL — ABNORMAL HIGH (ref 70–99)
Potassium: 4 mmol/L (ref 3.5–5.1)
Sodium: 140 mmol/L (ref 135–145)

## 2019-11-23 LAB — CBC
HCT: 41.8 % (ref 39.0–52.0)
Hemoglobin: 13.4 g/dL (ref 13.0–17.0)
MCH: 30.2 pg (ref 26.0–34.0)
MCHC: 32.1 g/dL (ref 30.0–36.0)
MCV: 94.1 fL (ref 80.0–100.0)
Platelets: 213 10*3/uL (ref 150–400)
RBC: 4.44 MIL/uL (ref 4.22–5.81)
RDW: 14.3 % (ref 11.5–15.5)
WBC: 8.1 10*3/uL (ref 4.0–10.5)
nRBC: 0 % (ref 0.0–0.2)

## 2019-11-23 LAB — TROPONIN I (HIGH SENSITIVITY): Troponin I (High Sensitivity): 12 ng/L (ref <18)

## 2019-11-23 MED ORDER — SODIUM CHLORIDE 0.9% FLUSH: 3.0000 mL | Freq: Once | INTRAVENOUS | Status: DC

## 2019-11-23 MED ORDER — BUSPIRONE HCL 10 MG PO TABS: 10.0000 mg | ORAL_TABLET | Freq: Every day | ORAL | 11 refills | Status: DC

## 2019-11-23 NOTE — ED Triage Notes (Signed)
Patient reports SOB/chest tightness this evening , denies chest pain , no fever or cough , history of CAD.

## 2019-11-23 NOTE — Patient Instructions (Addendum)
We will try Buspar at night to help with anxiety. Take 10mg  at bedtime. Continue melatonin if you wish. Sleep hygiene recommended.   Please continue using your CPAP regularly. While your insurance requires that you use CPAP at least 4 hours each night on 70% of the nights, I recommend, that you not skip any nights and use it throughout the night if you can. Getting used to CPAP and staying with the treatment long term does take time and patience and discipline. Untreated obstructive sleep apnea when it is moderate to severe can have an adverse impact on cardiovascular health and raise her risk for heart disease, arrhythmias, hypertension, congestive heart failure, stroke and diabetes. Untreated obstructive sleep apnea causes sleep disruption, nonrestorative sleep, and sleep deprivation. This can have an impact on your day to day functioning and cause daytime sleepiness and impairment of cognitive function, memory loss, mood disturbance, and problems focussing. Using CPAP regularly can improve these symptoms.   Continue close follow up with PCP for co morbidity management.   Follow up in 3 months    Sleep Apnea Sleep apnea affects breathing during sleep. It causes breathing to stop for a short time or to become shallow. It can also increase the risk of:  Heart attack.  Stroke.  Being very overweight (obese).  Diabetes.  Heart failure.  Irregular heartbeat. The goal of treatment is to help you breathe normally again. What are the causes? There are three kinds of sleep apnea:  Obstructive sleep apnea. This is caused by a blocked or collapsed airway.  Central sleep apnea. This happens when the brain does not send the right signals to the muscles that control breathing.  Mixed sleep apnea. This is a combination of obstructive and central sleep apnea. The most common cause of this condition is a collapsed or blocked airway. This can happen if:  Your throat muscles are too  relaxed.  Your tongue and tonsils are too large.  You are overweight.  Your airway is too small. What increases the risk?  Being overweight.  Smoking.  Having a small airway.  Being older.  Being male.  Drinking alcohol.  Taking medicines to calm yourself (sedatives or tranquilizers).  Having family members with the condition. What are the signs or symptoms?  Trouble staying asleep.  Being sleepy or tired during the day.  Getting angry a lot.  Loud snoring.  Headaches in the morning.  Not being able to focus your mind (concentrate).  Forgetting things.  Less interest in sex.  Mood swings.  Personality changes.  Feelings of sadness (depression).  Waking up a lot during the night to pee (urinate).  Dry mouth.  Sore throat. How is this diagnosed?  Your medical history.  A physical exam.  A test that is done when you are sleeping (sleep study). The test is most often done in a sleep lab but may also be done at home. How is this treated?   Sleeping on your side.  Using a medicine to get rid of mucus in your nose (decongestant).  Avoiding the use of alcohol, medicines to help you relax, or certain pain medicines (narcotics).  Losing weight, if needed.  Changing your diet.  Not smoking.  Using a machine to open your airway while you sleep, such as: ? An oral appliance. This is a mouthpiece that shifts your lower jaw forward. ? A CPAP device. This device blows air through a mask when you breathe out (exhale). ? An EPAP device. This has  valves that you put in each nostril. ? A BPAP device. This device blows air through a mask when you breathe in (inhale) and breathe out.  Having surgery if other treatments do not work. It is important to get treatment for sleep apnea. Without treatment, it can lead to:  High blood pressure.  Coronary artery disease.  In men, not being able to have an erection (impotence).  Reduced thinking  ability. Follow these instructions at home: Lifestyle  Make changes that your doctor recommends.  Eat a healthy diet.  Lose weight if needed.  Avoid alcohol, medicines to help you relax, and some pain medicines.  Do not use any products that contain nicotine or tobacco, such as cigarettes, e-cigarettes, and chewing tobacco. If you need help quitting, ask your doctor. General instructions  Take over-the-counter and prescription medicines only as told by your doctor.  If you were given a machine to use while you sleep, use it only as told by your doctor.  If you are having surgery, make sure to tell your doctor you have sleep apnea. You may need to bring your device with you.  Keep all follow-up visits as told by your doctor. This is important. Contact a doctor if:  The machine that you were given to use during sleep bothers you or does not seem to be working.  You do not get better.  You get worse. Get help right away if:  Your chest hurts.  You have trouble breathing in enough air.  You have an uncomfortable feeling in your back, arms, or stomach.  You have trouble talking.  One side of your body feels weak.  A part of your face is hanging down. These symptoms may be an emergency. Do not wait to see if the symptoms will go away. Get medical help right away. Call your local emergency services (911 in the [place] Do not drive yourself to the hospital. Summary  This condition affects breathing during sleep.  The most common cause is a collapsed or blocked airway.  The goal of treatment is to help you breathe normally while you sleep. This information is not intended to replace advice given to you by your health care provider. Make sure you discuss any questions you have with your health care provider. Document Revised: 06/18/2018 Document Reviewed: 04/27/2018 Elsevier Patient Education  [place].   Buspirone tablets What is this medicine? BUSPIRONE (byoo  SPYE rone) is used to treat anxiety disorders. This medicine may be used for other purposes; ask your health care provider or pharmacist if you have questions. COMMON BRAND NAME(S): BuSpar What should I tell my health care provider before I take this medicine? They need to know if you have any of these conditions:  kidney or liver disease  an unusual or allergic reaction to buspirone, other medicines, foods, dyes, or preservatives  pregnant or trying to get pregnant  breast-feeding How should I use this medicine? Take this medicine by mouth with a glass of water. Follow the directions on the prescription label. You may take this medicine with or without food. To ensure that this medicine always works the same way for you, you should take it either always with or always without food. Take your doses at regular intervals. Do not take your medicine more often than directed. Do not stop taking except on the advice of your doctor or health care professional. Talk to your pediatrician regarding the use of this medicine in children. Special care may be  needed. Overdosage: If you think you have taken too much of this medicine contact a poison control center or emergency room at once. NOTE: This medicine is only for you. Do not share this medicine with others. What if I miss a dose? If you miss a dose, take it as soon as you can. If it is almost time for your next dose, take only that dose. Do not take double or extra doses. What may interact with this medicine? Do not take this medicine with any of the following medications:  linezolid  MAOIs like Carbex, Eldepryl, Marplan, Nardil, and Parnate  methylene blue  procarbazine This medicine may also interact with the following medications:  diazepam  digoxin  diltiazem  erythromycin  grapefruit juice  haloperidol  medicines for mental depression or mood problems  medicines for seizures like carbamazepine, phenobarbital and  phenytoin  nefazodone  other medications for anxiety  rifampin  ritonavir  some antifungal medicines like itraconazole, ketoconazole, and voriconazole  verapamil  warfarin This list may not describe all possible interactions. Give your health care provider a list of all the medicines, herbs, non-prescription drugs, or dietary supplements you use. Also tell them if you smoke, drink alcohol, or use illegal drugs. Some items may interact with your medicine. What should I watch for while using this medicine? Visit your doctor or health care professional for regular checks on your progress. It may take 1 to 2 weeks before your anxiety gets better. You may get drowsy or dizzy. Do not drive, use machinery, or do anything that needs mental alertness until you know how this drug affects you. Do not stand or sit up quickly, especially if you are an older patient. This reduces the risk of dizzy or fainting spells. Alcohol can make you more drowsy and dizzy. Avoid alcoholic drinks. What side effects may I notice from receiving this medicine? Side effects that you should report to your doctor or health care professional as soon as possible:  blurred vision or other vision changes  chest pain  confusion  difficulty breathing  feelings of hostility or anger  muscle aches and pains  numbness or tingling in hands or feet  ringing in the ears  skin rash and itching  vomiting  weakness Side effects that usually do not require medical attention (report to your doctor or health care professional if they continue or are bothersome):  disturbed dreams, nightmares  headache  nausea  restlessness or nervousness  sore throat and nasal congestion  stomach upset This list may not describe all possible side effects. Call your doctor for medical advice about side effects. You may report side effects to FDA at 1-800-FDA-1088. Where should I keep my medicine? Keep out of the reach of  children. Store at room temperature below 30 degrees C (86 degrees F). Protect from light. Keep container tightly closed. Throw away any unused medicine after the expiration date. NOTE: This sheet is a summary. It may not cover all possible information. If you have questions about this medicine, talk to your doctor, pharmacist, or health care provider.  2020 Elsevier/Gold Standard (2010-04-11 18:06:11)  CPAP and BPAP Information CPAP and BPAP are methods of helping a person breathe with the use of air pressure. CPAP stands for "continuous positive airway pressure." BPAP stands for "bi-level positive airway pressure." In both methods, air is blown through your nose or mouth and into your air passages to help you breathe well. CPAP and BPAP use different amounts of pressure to blow  air. With CPAP, the amount of pressure stays the same while you breathe in and out. With BPAP, the amount of pressure is increased when you breathe in (inhale) so that you can take larger breaths. Your health care provider will recommend whether CPAP or BPAP would be more helpful for you. Why are CPAP and BPAP treatments used? CPAP or BPAP can be helpful if you have:  Sleep apnea.  Chronic obstructive pulmonary disease (COPD).  Heart failure.  Medical conditions that weaken the muscles of the chest including muscular dystrophy, or neurological diseases such as amyotrophic lateral sclerosis (ALS).  Other problems that cause breathing to be weak, abnormal, or difficult. CPAP is most commonly used for obstructive sleep apnea (OSA) to keep the airways from collapsing when the muscles relax during sleep. How is CPAP or BPAP administered? Both CPAP and BPAP are provided by a small machine with a flexible plastic tube that attaches to a plastic mask. You wear the mask. Air is blown through the mask into your nose or mouth. The amount of pressure that is used to blow the air can be adjusted on the machine. Your health care  provider will determine the pressure setting that should be used based on your individual needs. When should CPAP or BPAP be used? In most cases, the mask only needs to be worn during sleep. Generally, the mask needs to be worn throughout the night and during any daytime naps. People with certain medical conditions may also need to wear the mask at other times when they are awake. Follow instructions from your health care provider about when to use the machine. What are some tips for using the mask?   Because the mask needs to be snug, some people feel trapped or closed-in (claustrophobic) when first using the mask. If you feel this way, you may need to get used to the mask. One way to do this is by holding the mask loosely over your nose or mouth and then gradually applying the mask more snugly. You can also gradually increase the amount of time that you use the mask.  Masks are available in various types and sizes. Some fit over your mouth and nose while others fit over just your nose. If your mask does not fit well, talk with your health care provider about getting a different one.  If you are using a mask that fits over your nose and you tend to breathe through your mouth, a chin strap may be applied to help keep your mouth closed.  The CPAP and BPAP machines have alarms that may sound if the mask comes off or develops a leak.  If you have trouble with the mask, it is very important that you talk with your health care provider about finding a way to make the mask easier to tolerate. Do not stop using the mask. Stopping the use of the mask could have a negative impact on your health. What are some tips for using the machine?  Place your CPAP or BPAP machine on a secure table or stand near an electrical outlet.  Know where the on/off switch is located on the machine.  Follow instructions from your health care provider about how to set the pressure on your machine and when you should use  it.  Do not eat or drink while the CPAP or BPAP machine is on. Food or fluids could get pushed into your lungs by the pressure of the CPAP or BPAP.  Do not smoke.  Tobacco smoke residue can damage the machine.  For home use, CPAP and BPAP machines can be rented or purchased through home health care companies. Many different brands of machines are available. Renting a machine before purchasing may help you find out which particular machine works well for you.  Keep the CPAP or BPAP machine and attachments clean. Ask your health care provider for specific instructions. Get help right away if:  You have redness or open areas around your nose or mouth where the mask fits.  You have trouble using the CPAP or BPAP machine.  You cannot tolerate wearing the CPAP or BPAP mask.  You have pain, discomfort, and bloating in your abdomen. Summary  CPAP and BPAP are methods of helping a person breathe with the use of air pressure.  Both CPAP and BPAP are provided by a small machine with a flexible plastic tube that attaches to a plastic mask.  If you have trouble with the mask, it is very important that you talk with your health care provider about finding a way to make the mask easier to tolerate. This information is not intended to replace advice given to you by your health care provider. Make sure you discuss any questions you have with your health care provider. Document Revised: 12/22/2018 Document Reviewed: 07/21/2016 Elsevier Patient Education  [place].

## 2019-11-23 NOTE — Progress Notes (Signed)
PATIENT: Fernando Hess DOB: 1946-03-14  REASON FOR VISIT: follow up HISTORY FROM: patient  Chief Complaint  Patient presents with  . Follow-up    6 mon f/u. Wife present. Rm 5. No new concerns at this time.      HISTORY OF PRESENT ILLNESS: Today 11/23/19 Fernando Hess is a 74 y.o. male here today for follow up for OSA on CPAP.  He admits that he continues to struggle with compliance.  He has placed CPAP therapy nearly every night but continues to have difficulty with meeting 4-hour compliance.  His wife presents with him today and aids in history.  She states that he often will take his mask off stating " this thing is going to kill me".  He admits to some anxiety with using CPAP therapy.  He has tried multiple masks with similar outcomes.  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  He feels that this has created more anxiety with using CPAP.  He feels that he is tolerating pressure settings well.  He denies any other concerns with CPAP therapy.  Compliance report dated 10/23/2019 through 11/21/2019 reveals that he used CPAP 27 of the last 30 days for compliance of 90%.  He used CPAP greater than 4 hours 10 of the last 30 days for compliance of 33%.  Average usage on days used was 3 hours and 13 minutes.  Residual AHI was 3.0 on 5 to 16 cm of water and an EPR of 3.  There was no significant leak noted.  He was recently hospitalized for two days with acute on chronic CHF.  He was discharged on 11/20/2019.  He reports that he is feeling much better.  Shortness of breath has resolved.  He has follow-up this week with his primary care provider.  He did walk for exercise yesterday for about 30 minutes.   HISTORY: (copied from my note on 05/25/2019)  Fernando Hess is a 74 y.o. male here today for follow up of OSA on CPAP. AHI was 8 at last follow up in 02/2019.  He reports he is doing very well from a  compliance perspective.  He does note benefit of using CPAP therapy.  He continues to have headaches.  Headaches are described as a tension or pressure in the top of his head.  These headaches have improved somewhat since starting CPAP therapy.  He states that he has taken Tylenol at least once daily every day.  He does not drink much water at all.  He has not monitored his blood pressures at home.  He denies allergy symptoms.  He denies any vision changes, nausea, vomiting, light or sound sensitivity.  Compliance report dated 04/24/2019 through 05/23/2019 reveals that he is using CPAP 30 out of the last 30 days for compliance of 100%.  27 of those days he used CPAP greater than 4 hours for compliance of 90%.  Average usage was 5 hours and 28 minutes.  AHI is now 6.2 on 5 to 16 cm of water and an EPR of 3.  There was no significant leak.  Average pressure 12.5.  Maximum pressure 14.2.   HISTORY: (copied from my note on 03/07/2019)  Fernando Hess a 74 y.o.malehere today for follow up of OSA on CPAP. He does note less headaches since starting CPAP. He may have 1 headache a month now. He is tolerating therapy well and  without concerns. Baseline AHI 37.9/h and supine 58.5/h.  02/05/2019 - 03/06/2019 20 Usage days 29/30 days (97%) >= 4 hours 26 days (87%) < 4 hours 3 days (10%) Usage hours 172 hours 43 minutes Average usage (total days) 5 hours 45 minutes Average usage (days used) 5 hours 57 minutes Median usage (days used) 5 hours 50 minutes Total used hours (value since last reset - 03/06/2019) 352 hours AirSense 10 AutoSet Serial number [id number] Mode AutoSet Min Pressure 5 cmH2O Max Pressure 15 cmH2O EPR Fulltime EPR level 3 Response Soft Therapy Pressure - cmH2O Median: 8.1 95th percentile: 12.8 Maximum: 14.1 Leaks - L/min Median: 2.4 95th percentile: 5.5 Maximum: 23.8 Events per hour AI: 6.8 HI: 2.0 AHI: 8.8 Apnea Index Central: 1.9 Obstructive: 4.8 Unknown: 0.1 RERA Index  0.5 Cheyne-Stokes respiration (average duration per night) 13 minutes (4%)   History (copied from Dr Edwena Felty note on 11/11/2018)  Fernando Hess a 74 y.o.malepatient, seen here as in a referral from Dr.Ahernforsleep apnea evaluation in relation to sleep.   He reports headaches any time of the day, and his ophthalmologist Dr Deloria Lair, MD suggested the PSG test. He suspected hypoxemia contributing to abnormal vascular congestion. Patient has been physically inactive since Bypass surgery.    Sleep habits are as follows:the couple sleeps in separate rooms, she snores, he doesn't think he does. She says he does.  5 PM is supper time, and bedtime is 10 PM, but he sometimes is in be as early as 7. The patient usually watches TV during the evening hours. He describes his bedroom as not entirely cool, but quiet and dark. He mainly sleeps prone, without any pillows. Sleep duration varies, his sleep is mainly fragmented by nocturia. He estimates that he can go to the bathroom every 2 hours. The patient reports that he wakes up because he has to go to the bathroom. He has a dry mouth when he wakes up. He has difficulties going back to sleep.  Sometimes he goes back to the den and watches tv for hours and has a snack.  Racing thoughts.  He rises at 8.30 AM - goes back to bed after breakfast. Same after lunch- naps last 2-3 hours (!). He sleeps to escape his headaches.  Estimated sleep time up to 10 hours.   Sleep medical history: never had a sleep study. Headaches. CKD grade 4 . 2 surgeries for renal stones 2019. DM 2 - 1999, now insulin for the last 6 month. diabetic retinopathy, nephropathy, bradycardia.   Family sleep history:2 sons with OSA.   Social history:married, retired, 2 adult sons. Non smoker, seldomly ETOH,  Caffeine : he drinks coke, 1 Coffee/ day and chocolate, seldom iced tea - when going out.   REVIEW OF SYSTEMS: Out of a complete 14  system review of symptoms, the patient complains only of the following symptoms, shortness of breath, restless legs, insomnia, daytime sleepiness, snoring, confusion and all other reviewed systems are negative.  ALLERGIES: Allergies  Allergen Reactions  . Victoza [Liraglutide]     Severe fatigue & insomnia  . Levaquin [Levofloxacin] Itching and Rash  . Lisinopril Itching and Rash    HOME MEDICATIONS: Outpatient Medications Prior to Visit  Medication Sig Dispense Refill  . aspirin 81 MG chewable tablet Chew 1 tablet (81 mg total) by mouth daily.    [name] atorvastatin (LIPITOR) 40 MG tablet Take 40 mg by mouth every morning.     . B-D UF III MINI PEN NEEDLES 31G X 5  MM MISC USE AS DIRECTED WITH LANTUS SOLOSTAR    . cilostazol (PLETAL) 50 MG tablet Take 1 tablet (50 mg total) by mouth 2 (two) times daily. 180 tablet 3  . DULoxetine (CYMBALTA) 30 MG capsule Take 30 mg by mouth at bedtime.    . DULoxetine (CYMBALTA) 60 MG capsule Take 60 mg by mouth daily.   4  . furosemide (LASIX) 20 MG tablet Take 1 tablet (20 mg total) by mouth daily. 30 tablet 6  . glimepiride (AMARYL) 4 MG tablet Take 4 mg by mouth 2 (two) times daily.     . Insulin Glargine (BASAGLAR KWIKPEN) 100 UNIT/ML SOPN Inject 31 Units into the skin daily.     . Melatonin 3 MG TABS Take 1 tablet by mouth daily as needed.    . metoprolol succinate (TOPROL-XL) 25 MG 24 hr tablet TAKE 1/2 TABLET BY MOUTH EVERY DAY (Patient taking differently: Take 12.5 mg by mouth daily. ) 45 tablet 1  . Multiple Vitamin (MULTIVITAMIN) tablet Take 1 tablet by mouth daily.    . nitroGLYCERIN (NITROSTAT) 0.4 MG SL tablet Place 0.4 mg under the tongue every 5 (five) minutes as needed for chest pain.    [name] ondansetron (ZOFRAN-ODT) 4 MG disintegrating tablet Take 4 mg by mouth every 8 (eight) hours as needed for nausea or vomiting.     Glory Rosebush ULTRA test strip FOR USE OF CHEACKING BLOOD ONCE DAILY    . pantoprazole (PROTONIX) 40 MG tablet Take 1 tablet (40  mg total) by mouth daily. 30 tablet 6  . pioglitazone (ACTOS) 45 MG tablet Take 45 mg by mouth daily.     No facility-administered medications prior to visit.    PAST MEDICAL HISTORY: Past Medical History:  Diagnosis Date  . Atrial premature complexes   . CAD (coronary artery disease), native coronary artery cardiologist-- dr Raliegh Ip. nelson   hx NSTEMI 09-24-2015  s/p  CABG x5 on 10-01-2017;  post op STEMI inferolateral wall,  SVG OM1 and SVG OM2 occluded, distal OM occlusion the calpruit, treated medically  . CKD (chronic kidney disease), stage III   . Erectile dysfunction   . Esophageal reflux   . History of atrial fibrillation    post op CABG 10-02-2015  . History of kidney stones   . History of non-ST elevation myocardial infarction (NSTEMI) 09/24/2015   s/p  CABG x5  . History of ST elevation myocardial infarction (STEMI) 10/22/2015   inferior wall,  post op CABG 10-02-2015  . Hyperlipidemia   . Hypertension   . Kidney stone    h/o  . Left ureteral stone   . Mild atherosclerosis of both carotid arteries   . Nephrolithiasis    per CT bilateral non-obstructive calculi  . Peripheral artery disease    LE Arterial US 01/2019: R PTA and ATA occluded; L ATA occluded  . RBBB (right bundle branch block)   . Renal atrophy, right   . Sleep apnea    wears cpap   . ST elevation myocardial infarction (STEMI) of inferior wall ([place]) 10/22/2015  . Type 2 diabetes mellitus treated with insulin ([place])    followed by pcp  . Wears glasses     PAST SURGICAL HISTORY: Past Surgical History:  Procedure Laterality Date  . APPENDECTOMY  1965  . CARDIAC CATHETERIZATION N/A 09/26/2015   Procedure: Left Heart Cath and Coronary Angiography;  Surgeon: Troy Sine, MD;  Location: [place] [facility];  Service: Cardiovascular;  Laterality: N/A;  . CARDIAC  CATHETERIZATION N/A 10/22/2015   Procedure: Left Heart Cath and Coronary Angiography;  Surgeon: Sherren Mocha, MD;  Location: [facility];   Service: Cardiovascular;  Laterality: N/A;  . CATARACT EXTRACTION W/ INTRAOCULAR LENS  IMPLANT, BILATERAL  2017  . COLONOSCOPY    . CORONARY ARTERY BYPASS GRAFT N/A 10/02/2015   Procedure: CORONARY ARTERY BYPASS GRAFTING (CABG) X5 LIMA-LAD; SVG-DIAG; SVG-OM; SVG-PD; SVG-RAMUS TRANSESOPHAGEAL ECHOCARDIOGRAM (TEE) ENDOSCOPIC GREATER SAPHENOUS VEIN HARVEST BILAT LE;  Surgeon: Ivin Poot, MD;  Location: [facility];  Service: Open Heart Surgery;  Laterality: N/A;  . CYSTOSCOPY/URETEROSCOPY/HOLMIUM LASER/STENT PLACEMENT Left 08/10/2018   Procedure: CYSTOSCOPY/URETEROSCOPY/HOLMIUM LASER/STENT PLACEMENT;  Surgeon: Festus Aloe, MD;  Location[facility];  Service: Urology;  Laterality: Left;  . CYSTOSCOPY/URETEROSCOPY/HOLMIUM LASER/STENT PLACEMENT Left 09/10/2018   Procedure: CYSTOSCOPY/URETEROSCOPY/HOLMIUM LASER/STENT EXCHANGE;  Surgeon: Festus Aloe, MD;  Location: [facility];  Service: Urology;  Laterality: Left;  . LEFT HEART CATHETERIZATION WITH CORONARY ANGIOGRAM N/A 04/13/2014   Procedure: LEFT HEART CATHETERIZATION WITH CORONARY ANGIOGRAM;  Surgeon: Jacolyn Reedy, MD;  Location: [facility];  Service: Cardiovascular;  Laterality: N/A;  . LEG SURGERY Right age 72   closed reduction leg fracture  . POLYPECTOMY    . TEE WITHOUT CARDIOVERSION N/A 10/02/2015   Procedure: TRANSESOPHAGEAL ECHOCARDIOGRAM (TEE);  Surgeon: Ivin Poot, MD;  Location: [place];  Service: Open Heart Surgery;  Laterality: N/A;  . URETEROSCOPY WITH HOLMIUM LASER LITHOTRIPSY Bilateral 2004;  2005  dr grapey  [email or web address]   . VASECTOMY      FAMILY HISTORY: Family History  Problem Relation Age of Onset  . Heart attack Mother   . Heart attack Father   . Diabetes Brother   . Pancreatic cancer Brother   . Diabetes Brother   . Colon cancer Neg Hx   . Esophageal cancer Neg Hx   . Prostate cancer Neg Hx   . Rectal cancer Neg Hx   . Stomach cancer Neg Hx   . Colon polyps Neg Hx     SOCIAL HISTORY: Social  History   Socioeconomic History  . Marital status: Married    Spouse name: Not on file  . Number of children: Not on file  . Years of education: Not on file  . Highest education level: Not on file  Occupational History  . Occupation: retired  Tobacco Use  . Smoking status: Never Smoker  . Smokeless tobacco: Never Used  Substance and Sexual Activity  . Alcohol use: Not Currently  . Drug use: Never  . Sexual activity: Not on file  Other Topics Concern  . 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 Difficulty of Paying Living Expenses: Not on file  Food Insecurity:   . Worried About Charity fundraiser in the Last Year: Not on file  . Ran Out of Food in the Last Year: Not on file  Transportation Needs:   . Lack of Transportation (Medical): Not on file  . Lack of Transportation (Non-Medical): Not on file  Physical Activity:   . Days of Exercise per Week: Not on file  . Minutes of Exercise per Session: Not on file  Stress:   . Feeling of Stress : Not on file  Social Connections:   . Frequency of Communication with Friends and Family: Not on file  . Frequency of Social  Gatherings with Friends and Family: Not on file  . Attends Religious Services: Not on file  . Active Member of Clubs or Organizations: Not on file  . Attends Archivist Meetings: Not on file  . Marital Status: Not on file  Intimate Partner Violence:   . Fear of Current or Ex-Partner: Not on file  . Emotionally Abused: Not on file  . Physically Abused: Not on file  . Sexually Abused: Not on file      PHYSICAL EXAM  Vitals:   11/23/19 0817  BP: 100/62  Pulse: 92  Temp: (!) 97.1 F (36.2 C)  TempSrc: Oral  Weight: 213 lb 12.8 oz (97 kg)  Height: 5\' 6"  (1.676 m)   Body mass index is 34.51 kg/m.  Generalized: Well developed, in no acute distress  Cardiology:  normal rate and rhythm Respiratory: Clear to auscultation bilaterally Neurological examination  Mentation: Alert oriented to time, place, history taking. Follows all commands speech and language fluent Cranial nerve II-XII: Pupils were equal round reactive to light. Extraocular movements were full, visual field were full  Motor: The motor testing reveals 5 over 5 strength of all 4 extremities. Good symmetric motor tone is noted throughout.  Gait and station: Gait is normal.   DIAGNOSTIC DATA (LABS, IMAGING, TESTING) - I reviewed patient records, labs, notes, testing and imaging myself where available.  No flowsheet data found.   Lab Results  Component Value Date   WBC 8.4 11/18/2019   HGB 13.4 11/18/2019   HCT 42.4 11/18/2019   MCV 94.0 11/18/2019   PLT 262 11/18/2019      Component Value Date/Time   NA 140 11/20/2019 0332   K 4.0 11/20/2019 0332   CL 102 11/20/2019 0332   CO2 27 11/20/2019 0332   GLUCOSE 127 (H) 11/20/2019 0332   BUN 36 (H) 11/20/2019 0332   CREATININE 2.18 (H) 11/20/2019 0332   CALCIUM 9.2 11/20/2019 0332   PROT 6.4 (L) 08/07/2018 1950   ALBUMIN 3.9 08/07/2018 1950   AST 27 08/07/2018 1950   ALT 30 08/07/2018 1950   ALKPHOS 59 08/07/2018 1950   BILITOT 1.3 (H) 08/07/2018 1950   GFRNONAA 29 (L) 11/20/2019 0332   GFRAA 33 (L) 11/20/2019 0332   Lab Results  Component Value Date   CHOL 81 10/23/2015   HDL 30 (L) 10/23/2015   LDLCALC 31 10/23/2015   TRIG 102 10/23/2015   CHOLHDL 2.7 10/23/2015   Lab Results  Component Value Date   HGBA1C 8.7 (H) 11/19/2019   Lab Results  Component Value Date   VITAMINB12 602 10/04/2009   Lab Results  Component Value Date   TSH 0.468 10/22/2015       ASSESSMENT AND PLAN 74 y.o. year old male  has a past medical history of Atrial premature complexes, CAD (coronary artery disease), native coronary artery (cardiologist-- dr Liane Comber), CKD (chronic kidney disease), stage III, Erectile dysfunction, Esophageal  reflux, History of atrial fibrillation, History of kidney stones, History of non-ST elevation myocardial infarction (NSTEMI) (09/24/2015), History of ST elevation myocardial infarction (STEMI) (10/22/2015), Hyperlipidemia, Hypertension, Kidney stone, Left ureteral stone, Mild atherosclerosis of both carotid arteries, Nephrolithiasis, Peripheral artery disease, RBBB (right bundle branch block), Renal atrophy, right, Sleep apnea, ST elevation myocardial infarction (STEMI) of inferior wall ([place]) (10/22/2015), Type 2 diabetes mellitus treated with insulin ([place]), and Wears glasses. here with     ICD-10-CM   1. OSA on CPAP  G47.33    Z99.89   2. Acute on  chronic diastolic CHF (congestive heart failure) ([place])  I50.33     Fortunately, Otilio appears to be doing much better from recent hospitalization for exacerbation of CHF.  He does continue to struggle with CPAP.  He is able to use therapy most days but continues to struggle with meeting for our recommendation.  After a lengthy discussion regarding need for CPAP and risk of untreated sleep apnea, specifically in relationship to his heart disease, he seems motivated to continue with therapy.  I do feel that there is a significant component of anxiety preventing him from using CPAP for an extended period of time.  He continues melatonin at night.  I will add Buspar 10mg  every night at bedtime.  We have discussed potential side effects of this medication.  He will call me with any concerns.  I have encouraged him to continue working towards nightly use CPAP with a minimum of 4-hour usage each night.  I have commended him on his efforts to exercise yesterday and encouraged him to continue regular exercise.  He will follow-up with primary care as well as cardiology as directed.  He will follow-up to see me in 3 months, sooner if needed.  He verbalizes understanding and agreement with this plan.   No orders of the defined types were placed in this encounter.    Meds  ordered this encounter  Medications  . busPIRone (BUSPAR) 10 MG tablet    Sig: Take 1 tablet (10 mg total) by mouth at bedtime.    Dispense:  30 tablet    Refill:  11    Order Specific Question:   Supervising Provider    Answer:   Melvenia Beam [id number]      I spent 15 minutes with the patient. 50% of this time was spent counseling and educating patient on plan of care and medications.    Debbora Presto, FNP-C 11/23/2019, 10:49 AM [facility] [address], [place], [place] [address] [phone]

## 2019-11-24 ENCOUNTER — Other Ambulatory Visit: Payer: Self-pay | Admitting: Internal Medicine

## 2019-11-24 LAB — TROPONIN I (HIGH SENSITIVITY): Troponin I (High Sensitivity): 14 ng/L (ref <18)

## 2019-11-24 LAB — BRAIN NATRIURETIC PEPTIDE: B Natriuretic Peptide: 84.7 pg/mL (ref 0.0–100.0)

## 2019-11-24 MED ORDER — FUROSEMIDE 10 MG/ML IJ SOLN
INTRAMUSCULAR | Status: AC
  Administered 2019-11-24: 40 mg via INTRAVENOUS
  Filled 2019-11-24: qty 4

## 2019-11-24 MED ORDER — FUROSEMIDE 10 MG/ML IJ SOLN: 40.0000 mg | Freq: Once | INTRAMUSCULAR | Status: AC

## 2019-11-24 NOTE — ED Notes (Signed)
BNP drawn and sent to lab.  Paper order during system downtime.

## 2019-11-24 NOTE — Discharge Instructions (Signed)
Take two Lasix tablets each morning for the next 2 days.

## 2019-11-24 NOTE — ED Provider Notes (Signed)
[facility] EMERGENCY DEPARTMENT Provider Note   CSN: [id number] Arrival date & time: 11/23/19  2116     History Chief Complaint  Patient presents with  . Shortness of Breath    Fernando Hess is a 73 y.o. male.  Patient presents to the emergency department for evaluation of difficulty breathing.  Patient has a history of coronary artery disease, status post bypass surgery and diastolic heart failure.  He was hospitalized a week ago for heart failure exacerbation.  He has been taking his Lasix daily.  He started to notice increased shortness of breath tonight.  He is not experiencing associated chest pain.  No nausea or diaphoresis.        Past Medical History:  Diagnosis Date  . Atrial premature complexes   . CAD (coronary artery disease), native coronary artery cardiologist-- dr Raliegh Ip. nelson   hx NSTEMI 09-24-2015  s/p  CABG x5 on 10-01-2017;  post op STEMI inferolateral wall,  SVG OM1 and SVG OM2 occluded, distal OM occlusion the calpruit, treated medically  . CKD (chronic kidney disease), stage III   . Erectile dysfunction   . Esophageal reflux   . History of atrial fibrillation    post op CABG 10-02-2015  . History of kidney stones   . History of non-ST elevation myocardial infarction (NSTEMI) 09/24/2015   s/p  CABG x5  . History of ST elevation myocardial infarction (STEMI) 10/22/2015   inferior wall,  post op CABG 10-02-2015  . Hyperlipidemia   . Hypertension   . Kidney stone    h/o  . Left ureteral stone   . Mild atherosclerosis of both carotid arteries   . Nephrolithiasis    per CT bilateral non-obstructive calculi  . Peripheral artery disease    LE Arterial US 01/2019: R PTA and ATA occluded; L ATA occluded  . RBBB (right bundle branch block)   . Renal atrophy, right   . Sleep apnea    wears cpap   . ST elevation myocardial infarction (STEMI) of inferior wall ([place]) 10/22/2015  . Type 2 diabetes mellitus treated with insulin ([place])    followed  by pcp  . Wears glasses     Patient Active Problem List   Diagnosis Date Noted  . Acute on chronic diastolic CHF (congestive heart failure) ([place]) 11/18/2019  . OSA on CPAP 03/07/2019  . Peripheral artery disease   . CKD (chronic kidney disease) stage 4, GFR 15-29 ml/min ([name]) 11/29/2018  . Hypersomnia with sleep apnea 11/29/2018  . Snoring 11/29/2018  . Intractable vascular headache 11/11/2018  . CAD (coronary artery disease) of artery bypass graft   . 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 Elevated troponin   . CAD (coronary artery disease), native coronary artery 09/24/2015  . Acute renal failure (ARF) ([place]) 09/24/2015  . Type II diabetes mellitus ([place]) 03/01/2008  . Hyperlipidemia, unspecified   . Essential hypertension   . History of kidney stones     Past Surgical History:  Procedure Laterality Date  . APPENDECTOMY  1965  . CARDIAC CATHETERIZATION N/A 09/26/2015   Procedure: Left Heart Cath and Coronary Angiography;  Surgeon: Troy Sine, MD;  Location: [place] [facility];  Service: Cardiovascular;  Laterality: N/A;  . CARDIAC CATHETERIZATION N/A 10/22/2015   Procedure: Left Heart Cath and Coronary Angiography;  Surgeon: Sherren Mocha, MD;  Location: [place] [facility];  Service: Cardiovascular;  Laterality: N/A;  . CATARACT EXTRACTION W/ INTRAOCULAR LENS  IMPLANT, BILATERAL  2017  .  COLONOSCOPY    . CORONARY ARTERY BYPASS GRAFT N/A 10/02/2015   Procedure: CORONARY ARTERY BYPASS GRAFTING (CABG) X5 LIMA-LAD; SVG-DIAG; SVG-OM; SVG-PD; SVG-RAMUS TRANSESOPHAGEAL ECHOCARDIOGRAM (TEE) ENDOSCOPIC GREATER SAPHENOUS VEIN HARVEST BILAT LE;  Surgeon: Ivin Poot, MD;  Location: [place];  Service: Open Heart Surgery;  Laterality: N/A;  . CYSTOSCOPY/URETEROSCOPY/HOLMIUM LASER/STENT PLACEMENT Left 08/10/2018   Procedure: CYSTOSCOPY/URETEROSCOPY/HOLMIUM LASER/STENT PLACEMENT;  Surgeon: Festus Aloe, MD;  [facility];  Service: Urology;  Laterality: Left;  .  CYSTOSCOPY/URETEROSCOPY/HOLMIUM LASER/STENT PLACEMENT Left 09/10/2018   Procedure: CYSTOSCOPY/URETEROSCOPY/HOLMIUM LASER/STENT EXCHANGE;  Surgeon: Festus Aloe, MD;  Location: [facility];  Service: Urology;  Laterality: Left;  . LEFT HEART CATHETERIZATION WITH CORONARY ANGIOGRAM N/A 04/13/2014   Procedure: LEFT HEART CATHETERIZATION WITH CORONARY ANGIOGRAM;  Surgeon: Jacolyn Reedy, MD;  Location: [facility] CATH LAB;  Service: Cardiovascular;  Laterality: N/A;  . LEG SURGERY Right age 54   closed reduction leg fracture  . POLYPECTOMY    . TEE WITHOUT CARDIOVERSION N/A 10/02/2015   Procedure: TRANSESOPHAGEAL ECHOCARDIOGRAM (TEE);  Surgeon: Ivin Poot, MD;  Location: [place];  Service: Open Heart Surgery;  Laterality: N/A;  . URETEROSCOPY WITH HOLMIUM LASER LITHOTRIPSY Bilateral 2004;  2005  dr grapey  @[email or web address]   . VASECTOMY         Family History  Problem Relation Age of Onset  . Heart attack Mother   . Heart attack Father   . Diabetes Brother   . Pancreatic cancer Brother   . Diabetes Brother   . Colon cancer Neg Hx   . Esophageal cancer Neg Hx   . Prostate cancer Neg Hx   . Rectal cancer Neg Hx   . Stomach cancer Neg Hx   . Colon polyps Neg Hx     Social History   Tobacco Use  . Smoking status: Never Smoker  . Smokeless tobacco: Never Used  Substance Use Topics  . Alcohol use: Not Currently  . Drug use: Never    Home Medications Prior to Admission medications   Medication Sig Start Date End Date Taking? Authorizing Provider  aspirin 81 MG chewable tablet Chew 1 tablet (81 mg total) by mouth daily. 10/25/15  Yes Jacolyn Reedy, MD  atorvastatin (LIPITOR) 40 MG tablet Take 40 mg by mouth every morning.    Yes [provider]  busPIRone (BUSPAR) 10 MG tablet Take 1 tablet (10 mg total) by mouth at bedtime. 11/23/19  Yes Lomax, Amy, NP  cilostazol (PLETAL) 50 MG tablet Take 1 tablet (50 mg total) by mouth 2 (two) times daily. 02/25/19  Yes Lorretta Harp, MD  DULoxetine  (CYMBALTA) 30 MG capsule Take 30 mg by mouth at bedtime.   Yes [provider]  DULoxetine (CYMBALTA) 60 MG capsule Take 60 mg by mouth daily.  04/12/18  Yes [provider]  furosemide (LASIX) 20 MG tablet Take 1 tablet (20 mg total) by mouth daily. 11/21/19  Yes Theora Gianotti, NP  glimepiride (AMARYL) 4 MG tablet Take 4 mg by mouth 2 (two) times daily.    Yes [provider]  Insulin Glargine (BASAGLAR KWIKPEN) 100 UNIT/ML SOPN Inject 31 Units into the skin daily.    Yes [provider]  Melatonin 3 MG TABS Take 1 tablet by mouth daily as needed.   Yes [provider]  metoprolol succinate (TOPROL-XL) 25 MG 24 hr tablet TAKE 1/2 TABLET BY MOUTH EVERY DAY Patient taking differently: Take 12.5 mg by mouth daily.  03/17/19  Yes  Dorothy Spark, MD  Multiple Vitamin (MULTIVITAMIN) tablet Take 1 tablet by mouth daily.   Yes [provider]  nitroGLYCERIN (NITROSTAT) 0.4 MG SL tablet Place 0.4 mg under the tongue every 5 (five) minutes as needed for chest pain.   Yes [provider]  ondansetron (ZOFRAN-ODT) 4 MG disintegrating tablet Take 4 mg by mouth every 8 (eight) hours as needed for nausea or vomiting.    Yes [provider]  pantoprazole (PROTONIX) 40 MG tablet Take 1 tablet (40 mg total) by mouth daily. 05/31/19  Yes [name], MD  pioglitazone (ACTOS) 45 MG tablet Take 45 mg by mouth daily.   Yes [provider]  B-D UF III MINI PEN NEEDLES 31G X 5 MM MISC USE AS DIRECTED WITH LANTUS SOLOSTAR 04/02/19   [provider]  ONETOUCH ULTRA test strip FOR USE OF [place] BLOOD ONCE DAILY 03/18/19   [provider]    Allergies    Victoza [liraglutide], Levaquin [levofloxacin], and Lisinopril  Review of Systems   Review of Systems  Respiratory: Positive for shortness of breath.   All other systems reviewed and are negative.   Physical Exam Updated Vital Signs BP 135/76   Pulse 69    Temp 98.7 F (37.1 C) (Oral)   Resp 20   Ht 5\' 6"  (1.676 m)   Wt 105 kg   SpO2 97%   BMI 37.36 kg/m   Physical Exam Vitals and nursing note reviewed.  Constitutional:      General: He is not in acute distress.    Appearance: Normal appearance. He is well-developed.  HENT:     Head: Normocephalic and atraumatic.     Right Ear: Hearing normal.     Left Ear: Hearing normal.     Nose: Nose normal.  Eyes:     Conjunctiva/sclera: Conjunctivae normal.     Pupils: Pupils are equal, round, and reactive to light.  Cardiovascular:     Rate and Rhythm: Regular rhythm.     Heart sounds: S1 normal and S2 normal. Murmur present. Systolic murmur present. No friction rub. No gallop.   Pulmonary:     Effort: Pulmonary effort is normal. No respiratory distress.     Breath sounds: Normal breath sounds.  Chest:     Chest wall: No tenderness.  Abdominal:     General: Bowel sounds are normal.     Palpations: Abdomen is soft.     Tenderness: There is no abdominal tenderness. There is no guarding or rebound. Negative signs include Murphy's sign and McBurney's sign.     Hernia: No hernia is present.  Musculoskeletal:        General: Normal range of motion.     Cervical back: Normal range of motion and neck supple.     Right lower leg: Edema (Trace) present.     Left lower leg: Edema (Trace) present.  Skin:    General: Skin is warm and dry.     Findings: No rash.  Neurological:     Mental Status: He is alert and oriented to person, place, and time.     GCS: GCS eye subscore is 4. GCS verbal subscore is 5. GCS motor subscore is 6.     Cranial Nerves: No cranial nerve deficit.     Sensory: No sensory deficit.     Coordination: Coordination normal.  Psychiatric:        Speech: Speech normal.        Behavior: Behavior normal.  Thought Content: Thought content normal.     ED Results / Procedures / Treatments   Labs (all labs ordered are listed, but only abnormal results are  displayed) Labs Reviewed  BASIC METABOLIC PANEL - Abnormal; Notable for the following components:      Result Value   Glucose, Bld 263 (*)    BUN 31 (*)    Creatinine, Ser 2.27 (*)    GFR calc non Af Amer 27 (*)    GFR calc Af Amer 32 (*)    All other components within normal limits  CBC  PROTIME-INR  BRAIN NATRIURETIC PEPTIDE  TROPONIN I (HIGH SENSITIVITY)  TROPONIN I (HIGH SENSITIVITY)    EKG EKG Interpretation  Date/Time:  Wednesday November 23 2019 21:16:43 EST Ventricular Rate:  91 PR Interval:    QRS Duration: 126 QT Interval:  394 QTC Calculation: 484 R Axis:   15 Text Interpretation: Sinus rhythm with Premature supraventricular complexes Right bundle branch block Abnormal ECG No significant change since last tracing Confirmed by Orpah Greek [id number]) on 11/24/2019 1:15:41 AM   Radiology DG Chest 2 View  Result Date: 11/23/2019 CLINICAL DATA:  Shortness of breath and difficulty breathing EXAM: CHEST - 2 VIEW COMPARISON:  November 18, 2019 FINDINGS: The heart size is mildly enlarged. The patient is status post prior median sternotomy. There is no pneumothorax or large pleural effusion. There are findings suggestive of early interstitial edema. IMPRESSION: Findings suggestive of early interstitial edema. Electronically Signed   By: Constance Holster M.D.   On: 11/23/2019 21:40    Procedures Procedures (including critical care time)  Medications Ordered in ED Medications  sodium chloride flush (NS) 0.9 % injection 3 mL (3 mLs Intravenous Not Given 11/24/19 0100)  furosemide (LASIX) injection 40 mg (40 mg Intravenous Given 11/24/19 0204)    ED Course  I have reviewed the triage vital signs and the nursing notes.  Pertinent labs & imaging results that were available during my care of the patient were reviewed by me and considered in my medical decision making (see chart for details).    MDM Rules/Calculators/A&P                      Patient presents to the  emergency department for evaluation of shortness of breath.  Patient does have a history of diastolic heart failure.  He was hospitalized a week ago with exacerbation.  He has been taking his Lasix as prescribed but today started to feel increased shortness of breath.  Chest x-ray with suggestion of early interstitial edema.  He was slightly tachypneic at arrival.  Patient administered IV Lasix and has had significant diuresis.  Respiratory rate is improved, he reports that he feels much better.  He was able to ambulate here in the hallway with oxygen saturation maintaining at 97%.  Based on this, does not require hospitalization.  Will have patient double his Lasix for 2 days and follow-up with cardiology.  Return if symptoms worsen. Final Clinical Impression(s) / ED Diagnoses Final diagnoses:  Acute on chronic diastolic heart failure [facility])    Rx / DC Orders ED Discharge Orders    None       Aylee Littrell, Gwenyth Allegra, MD 11/24/19 0430

## 2019-11-25 ENCOUNTER — Other Ambulatory Visit: Payer: Medicare Other

## 2019-12-01 NOTE — Progress Notes (Signed)
Cardiology Office Note:    Date:  12/02/2019   ID:  Fernando Hess, DOB [birth year], MRN [id number]  PCP:  Fernando Cruel, MD  Cardiologist:  Fernando Dawley, MD  Electrophysiologist:  None  Sleep: Dr. Brett Hess Nephrologist: Dr. Justin Hess  Referring MD: Fernando Cruel, MD   Chief Complaint:  Hospitalization Follow-up (admx w/ acute Diastolic CHF)    Patient Profile:    Fernando Hess is a 74 y.o. male with:   Coronary artery disease  S/p MI in 09/2015 >> s/p CABG  S/p inferolateral STEMI 2/17 >> S-OM1 and S-OM2 occluded; dOM occluded >> med Rx  Chronic diastolic CHF  Diabetes mellitus  Hypertension  Hyperlipidemia  Chronic kidney disease stage IV  Obstructive sleep apnea  Peripheral arterial disease  eval by Fernando Hess 7.2020; bilat tibial disease >> Med Rx (Pletal)  Prior CV studies: Echocardiogram 11/19/2019 EF 60-65, moderate LVH, GR 1 DD, no RWMA, mild AI, mild MR, mildly dilated ascending aorta (39 mm)  LE arterial US 02/11/2019 Right PTA and ATA occluded; left ATA occluded  Myoview 10/21/2018 Low risk, mildly abnormal stress nuclear study with apical thinning but no ischemia; EF 52 with mild global hypokinesis   Holter 06/11/18 Very frequent supraventricular ectopy, contributing to 15% of overall beats. Very frequent couplets and a very short runs of atrial tachycardia.  Echocardiogram 10/23/2015 EF 55-60, normal wall motion, mildly calcified aortic valve leaflets, mild AI, mild to moderate MR  Cardiac catheterization 10/22/2015 LM ostial 50 LAD mid 90 RI ostial 90 LCx proximal 80; OM1 80; OM2 100 (culprit for MI) RCA mid 50 LIMA-LAD patent SVG-RPDA patent SVG-D1 patent SVG-OM1 100 SVG-OM2100 Medical therapy  Pre-CABG Dopplers 09/28/2015 Summary: Findings suggest 1-39% internal carotid artery stenosis bilaterally. Vertebral arteries are patent with antegrade flow. Bilateral ABIs are within normal limits.   History of Present Illness:     Fernando Hess was recently admitted 3/5-3/7 with acute diastolic congestive heart failure.  He was diuresed with IV Lasix with improved symptoms.  He was also noted to have hypoglycemia with reported symptoms of hypoglycemia at home prior to admission.  His diabetic regimen was adjusted he was asked to follow-up with primary care.  His ARB was held secondary to elevated creatinine.  He returned to the emergency room 11/23/2019 with symptoms of decompensated heart failure.  He received IV Lasix and his oral Lasix was adjusted for several days.  He was discharged home from the emergency room.  He returns for follow-up.  He is here alone today.  However, we did connect with his wife via telephone during the visit.  Since discharge from the hospital, he is feeling better.  His breathing is improved.  He has not had further orthopnea.  He has not had lower extremity swelling.  He is able to use his CPAP again.  He has not had chest discomfort, syncope or near syncope.  Past Medical History:  Diagnosis Date  . Atrial premature complexes   . CAD (coronary artery disease), native coronary artery cardiologist-- dr Fernando Hess   hx NSTEMI 09-24-2015  s/p  CABG x5 on 10-01-2017;  post op STEMI inferolateral wall,  SVG OM1 and SVG OM2 occluded, distal OM occlusion the calpruit, treated medically  . CKD (chronic kidney disease), stage III   . Erectile dysfunction   . Esophageal reflux   . History of atrial fibrillation    post op CABG 10-02-2015  . History of kidney stones   . History of non-ST elevation  myocardial infarction (NSTEMI) 09/24/2015   s/p  CABG x5  . History of ST elevation myocardial infarction (STEMI) 10/22/2015   inferior wall,  post op CABG 10-02-2015  . Hyperlipidemia   . Hypertension   . Kidney stone    h/o  . Left ureteral stone   . Mild atherosclerosis of both carotid arteries   . Nephrolithiasis    per CT bilateral non-obstructive calculi  . Peripheral artery disease    LE Arterial  US 01/2019: R PTA and ATA occluded; L ATA occluded  . RBBB (right bundle branch block)   . Renal atrophy, right   . Sleep apnea    wears cpap   . ST elevation myocardial infarction (STEMI) of inferior wall (Fernando Hess) 10/22/2015  . Type 2 diabetes mellitus treated with insulin ([place])    followed by pcp  . Wears glasses     Current Medications: Current Meds  Medication Sig  . amoxicillin (AMOXIL) 875 MG tablet Take 875 mg by mouth 2 (two) times daily.  [name] aspirin 81 MG chewable tablet Chew 1 tablet (81 mg total) by mouth daily.  [name] atorvastatin (LIPITOR) 40 MG tablet Take 40 mg by mouth every morning.   . B-D UF III MINI PEN NEEDLES 31G X 5 MM MISC USE AS DIRECTED WITH LANTUS SOLOSTAR  . busPIRone (BUSPAR) 10 MG tablet Take 1 tablet (10 mg total) by mouth at bedtime.  . DULoxetine (CYMBALTA) 30 MG capsule Take 30 mg by mouth at bedtime.  . DULoxetine (CYMBALTA) 60 MG capsule Take 60 mg by mouth daily.   . furosemide (LASIX) 20 MG tablet Take 1 tablet (20 mg total) by mouth daily.  [name] glimepiride (AMARYL) 4 MG tablet Take 4 mg by mouth 2 (two) times daily.   . Insulin Glargine (BASAGLAR KWIKPEN) 100 UNIT/ML SOPN Inject 31 Units into the skin daily.   . Melatonin 3 MG TABS Take 1 tablet by mouth daily as needed.  . metoprolol succinate (TOPROL-XL) 25 MG 24 hr tablet TAKE 1/2 TABLET BY MOUTH EVERY DAY  . Multiple Vitamin (MULTIVITAMIN) tablet Take 1 tablet by mouth daily.  . nitroGLYCERIN (NITROSTAT) 0.4 MG SL tablet Place 0.4 mg under the tongue every 5 (five) minutes as needed for chest pain.  [name] ondansetron (ZOFRAN-ODT) 4 MG disintegrating tablet Take 4 mg by mouth every 8 (eight) hours as needed for nausea or vomiting.   Fernando Hess ULTRA test strip FOR USE OF [place] BLOOD ONCE DAILY  . pantoprazole (PROTONIX) 40 MG tablet TAKE 1 TABLET BY MOUTH EVERY DAY  . pioglitazone (ACTOS) 45 MG tablet Take 45 mg by mouth daily.  . [DISCONTINUED] cilostazol (PLETAL) 50 MG tablet Take 1 tablet (50 mg total)  by mouth 2 (two) times daily.     Allergies:   Levaquin [levofloxacin], Lisinopril, and Victoza [liraglutide]   Social History   Tobacco Use  . Smoking status: Never Smoker  . Smokeless tobacco: Never Used  Substance Use Topics  . Alcohol use: Not Currently  . Drug use: Never     Family Hx: The patient's family history includes Diabetes in his brother and brother; Heart attack in his father and mother; Pancreatic cancer in his brother. There is no history of Colon cancer, Esophageal cancer, Prostate cancer, Rectal cancer, Stomach cancer, or Colon polyps.  ROS   EKGs/Labs/Other Test Reviewed:    EKG:  EKG is not ordered today.  The ekg ordered today demonstrates n/a  Recent Labs: 11/23/2019: BUN 31; Creatinine, Ser 2.27; Hemoglobin  13.4; Platelets 213; Potassium 4.0; Sodium 140 11/24/2019: B Natriuretic Peptide 84.7   Recent Lipid Panel Lab Results  Component Value Date/Time   CHOL 81 10/23/2015 05:15 AM   TRIG 102 10/23/2015 05:15 AM   HDL 30 (L) 10/23/2015 05:15 AM   CHOLHDL 2.7 10/23/2015 05:15 AM   LDLCALC 31 10/23/2015 05:15 AM    Physical Exam:    VS:  BP 106/76   Pulse 74   Ht 5\' 6"  (1.676 m)   Wt 213 lb (96.6 kg)   SpO2 94%   BMI 34.38 kg/m     Wt Readings from Last 3 Encounters:  12/02/19 213 lb (96.6 kg)  11/23/19 231 lb 7.7 oz (105 kg)  11/23/19 213 lb 12.8 oz (97 kg)     Constitutional:      Appearance: Healthy appearance. Not in distress.  Neck:     Thyroid: Thyroid normal.     Vascular: JVD normal.  Pulmonary:     Effort: Pulmonary effort is normal.     Breath sounds: No wheezing. No rales.  Cardiovascular:     Normal rate. Regularly irregular rhythm. Normal S1. Normal S2.     Murmurs: There is no murmur.  Edema:    Peripheral edema absent.  Abdominal:     Palpations: Abdomen is soft. There is no hepatomegaly.  Skin:    General: Skin is warm and dry.  Neurological:     General: No focal deficit present.     Mental Status: Alert and  oriented to person, place and time.     Cranial Nerves: Cranial nerves are intact.      ASSESSMENT & PLAN:    1. Chronic diastolic CHF (congestive heart failure) ([place]) Echo 11/2019 demonstrated EF 60-65% with grade 1 diastolic dysfunction.  NYHA II.  Volume status appears to be improved.  Continue current dose of furosemide.  We discussed the importance of daily weights and when to contact us for further instruction.  I have also recommended that he maintain a low-sodium diet.  Follow-up in 3 months.  2. Coronary artery disease involving coronary bypass graft of native heart without angina pectoris History of CABG in 2017 with subsequent myocardial infarction.  Both vein grafts to the OM branches were occluded at that time.  Nuclear stress test in February 2020 was low risk.  He is currently doing well without anginal symptoms.  Continue aspirin, atorvastatin, metoprolol succinate.  3. CKD (chronic kidney disease) stage 4, GFR 15-29 ml/min ([name]) Repeat BMET today to keep close on renal function and potassium in the setting of loop diuretic therapy.  I have recommended that he continue follow-up with nephrology.  4. Essential hypertension The patient's blood pressure is controlled on his current regimen.  Continue current therapy.   5. Peripheral artery disease He has had improved symptoms on cilostazol.  However, this drug is contraindicated in congestive heart failure.  It seems that there is more concern for patients with advanced systolic heart failure, but the recommendation is to avoid it in any degree of heart failure.  I did review this with our Pharm.D. in the office.  Therefore, I have instructed the patient to stop taking cilostazol (Pletal).  I will notify Fernando Hess in case there is an alternate therapy that can be used.   Dispo:  Return in about 3 months (around 03/03/2020) for Routine Follow Up with Dr. Meda Coffee, or PA/NP, (virtual or in-person).   Medication Adjustments/Labs and  Tests Ordered: Current medicines are reviewed at  length with the patient today.  Concerns regarding medicines are outlined above.  Tests Ordered: Orders Placed This Encounter  Procedures  . Basic metabolic panel   Medication Changes: No orders of the defined types were placed in this encounter.   Signed, [name], [name]  12/02/2019 9:47 AM    [facility] [place], [place], [place]  [address] Phone: [phone]; Fax: [phone]

## 2019-12-02 ENCOUNTER — Ambulatory Visit (INDEPENDENT_AMBULATORY_CARE_PROVIDER_SITE_OTHER): Payer: Medicare Other | Admitting: Physician Assistant

## 2019-12-02 ENCOUNTER — Other Ambulatory Visit: Payer: Self-pay

## 2019-12-02 ENCOUNTER — Encounter: Payer: Self-pay | Admitting: Physician Assistant

## 2019-12-02 VITALS — BP 106/76 | HR 74 | Ht 66.0 in | Wt 213.0 lb

## 2019-12-02 DIAGNOSIS — I10 Essential (primary) hypertension: Secondary | ICD-10-CM | POA: Diagnosis not present

## 2019-12-02 DIAGNOSIS — I5032 Chronic diastolic (congestive) heart failure: Secondary | ICD-10-CM

## 2019-12-02 DIAGNOSIS — I25810 Atherosclerosis of coronary artery bypass graft(s) without angina pectoris: Secondary | ICD-10-CM

## 2019-12-02 DIAGNOSIS — N184 Chronic kidney disease, stage 4 (severe): Secondary | ICD-10-CM

## 2019-12-02 DIAGNOSIS — I739 Peripheral vascular disease, unspecified: Secondary | ICD-10-CM

## 2019-12-02 LAB — BASIC METABOLIC PANEL
BUN/Creatinine Ratio: 10 (ref 10–24)
BUN: 19 mg/dL (ref 8–27)
CO2: 24 mmol/L (ref 20–29)
Calcium: 9 mg/dL (ref 8.6–10.2)
Chloride: 103 mmol/L (ref 96–106)
Creatinine, Ser: 1.92 mg/dL — ABNORMAL HIGH (ref 0.76–1.27)
GFR calc Af Amer: 39 mL/min/{1.73_m2} — ABNORMAL LOW (ref >59)
GFR calc non Af Amer: 33 mL/min/{1.73_m2} — ABNORMAL LOW (ref >59)
Glucose: 111 mg/dL — ABNORMAL HIGH (ref 65–99)
Potassium: 4 mmol/L (ref 3.5–5.2)
Sodium: 140 mmol/L (ref 134–144)

## 2019-12-02 NOTE — Patient Instructions (Signed)
Medication Instructions:   Your physician has recommended you make the following change in your medication:   1) Stop Pletal (Cilostizol)  *If you need a refill on your cardiac medications before your next appointment, please call your pharmacy*  Lab Work:  You will have labs drawn today: BMET  If you have labs (blood work) drawn today and your tests are completely normal, you will receive your results only by: [name] MyChart Message (if you have MyChart) OR . A paper copy in the mail If you have any lab test that is abnormal or we need to change your treatment, we will call you to review the results.  Testing/Procedures:  None ordered today  Follow-Up: At [facility], you and your health needs are our priority.  As part of our continuing mission to provide you with exceptional heart care, we have created designated Provider Care Teams.  These Care Teams include your primary Cardiologist (physician) and Advanced Practice Providers (APPs -  Physician Assistants and Nurse Practitioners) who all work together to provide you with the care you need, when you need it.  We recommend signing up for the patient portal called "MyChart".  Sign up information is provided on this After Visit Summary.  MyChart is used to connect with patients for Virtual Visits (Telemedicine).  Patients are able to view lab/test results, encounter notes, upcoming appointments, etc.  Non-urgent messages can be sent to your provider as well.   To learn more about what you can do with MyChart, go to [email or web address].    Your next appointment:    On 03/09/20 at 11:45AM with [name], [name]  Other Instructions  Weight yourself daily, call if you weight increases 3 pounds or more in one day.   Two Gram Sodium Diet 2000 mg  What is Sodium? Sodium is a mineral found naturally in many foods. The most significant source of sodium in the diet is table salt, which is about 40% sodium.  Processed, convenience, and  preserved foods also contain a large amount of sodium.  The body needs only 500 mg of sodium daily to function,  A normal diet provides more than enough sodium even if you do not use salt.  Why Limit Sodium? A build up of sodium in the body can cause thirst, increased blood pressure, shortness of breath, and water retention.  Decreasing sodium in the diet can reduce edema and risk of heart attack or stroke associated with high blood pressure.  Keep in mind that there are many other factors involved in these health problems.  Heredity, obesity, lack of exercise, cigarette smoking, stress and what you eat all play a role.  General Guidelines:  Do not add salt at the table or in cooking.  One teaspoon of salt contains over 2 grams of sodium.  Read food labels  Avoid processed and convenience foods  Ask your dietitian before eating any foods not dicussed in the menu planning guidelines  Consult your physician if you wish to use a salt substitute or a sodium containing medication such as antacids.  Limit milk and milk products to 16 oz (2 cups) per day.  Shopping Hints:  READ LABELS!! "Dietetic" does not necessarily mean low sodium.  Salt and other sodium ingredients are often added to foods during processing.   Menu Planning Guidelines Food Group Choose More Often Avoid  Beverages (see also the milk group All fruit juices, low-sodium, salt-free vegetables juices, low-sodium carbonated beverages Regular vegetable or tomato juices, commercially softened water  used for drinking or cooking  Breads and Cereals Enriched white, wheat, rye and pumpernickel bread, hard rolls and dinner rolls; muffins, cornbread and waffles; most dry cereals, cooked cereal without added salt; unsalted crackers and breadsticks; low sodium or homemade bread crumbs Bread, rolls and crackers with salted tops; quick breads; instant hot cereals; pancakes; commercial bread stuffing; self-rising flower and biscuit mixes; regular  bread crumbs or cracker crumbs  Desserts and Sweets Desserts and sweets mad with mild should be within allowance Instant pudding mixes and cake mixes  Fats Butter or margarine; vegetable oils; unsalted salad dressings, regular salad dressings limited to 1 Tbs; light, sour and heavy cream Regular salad dressings containing bacon fat, bacon bits, and salt pork; snack dips made with instant soup mixes or processed cheese; salted nuts  Fruits Most fresh, frozen and canned fruits Fruits processed with salt or sodium-containing ingredient (some dried fruits are processed with sodium sulfites        Vegetables Fresh, frozen vegetables and low- sodium canned vegetables Regular canned vegetables, sauerkraut, pickled vegetables, and others prepared in brine; frozen vegetables in sauces; vegetables seasoned with ham, bacon or salt pork  Condiments, Sauces, Miscellaneous  Salt substitute with physician's approval; pepper, herbs, spices; vinegar, lemon or lime juice; hot pepper sauce; garlic powder, onion powder, low sodium soy sauce (1 Tbs.); low sodium condiments (ketchup, chili sauce, mustard) in limited amounts (1 tsp.) fresh ground horseradish; unsalted tortilla chips, pretzels, potato chips, popcorn, salsa (1/4 cup) Any seasoning made with salt including garlic salt, celery salt, onion salt, and seasoned salt; sea salt, rock salt, kosher salt; meat tenderizers; monosodium glutamate; mustard, regular soy sauce, barbecue, sauce, chili sauce, teriyaki sauce, steak sauce, Worcestershire sauce, and most flavored vinegars; canned gravy and mixes; regular condiments; salted snack foods, olives, picles, relish, horseradish sauce, catsup   Food preparation: Try these seasonings Meats:    Pork Sage, onion Serve with applesauce  Chicken Poultry seasoning, thyme, parsley Serve with cranberry sauce  Lamb Curry powder, rosemary, garlic, thyme Serve with mint sauce or jelly  Veal Marjoram, basil Serve with current  jelly, cranberry sauce  Beef Pepper, bay leaf Serve with dry mustard, unsalted chive butter  Fish [place] leaf, dill Serve with unsalted lemon butter, unsalted parsley butter  Vegetables:    Asparagus Lemon juice   Broccoli Lemon juice   Carrots Mustard dressing parsley, mint, nutmeg, glazed with unsalted butter and sugar   Green beans Marjoram, lemon juice, nutmeg,dill seed   Tomatoes Basil, marjoram, onion   Spice /blend for [employer]" 4 tsp ground thyme 1 tsp ground sage 3 tsp ground rosemary 4 tsp ground marjoram   Test your knowledge 1. A product that says "Salt Free" may still contain sodium. True or False 2. Garlic Powder and Hot Pepper Sauce an be used as alternative seasonings.True or False 3. Processed foods have more sodium than fresh foods.  True or False 4. Canned Vegetables have less sodium than froze True or False  WAYS TO DECREASE YOUR SODIUM INTAKE 1. Avoid the use of added salt in cooking and at the table.  Table salt (and other prepared seasonings which contain salt) is probably one of the greatest sources of sodium in the diet.  Unsalted foods can gain flavor from the sweet, sour, and butter taste sensations of herbs and spices.  Instead of using salt for seasoning, try the following seasonings with the foods listed.  Remember: how you use them to enhance natural food flavors is limited only  by your creativity... Allspice-Meat, fish, eggs, fruit, peas, red and yellow vegetables Almond Extract-Fruit baked goods Anise Seed-Sweet breads, fruit, carrots, beets, cottage cheese, cookies (tastes like licorice) Basil-Meat, fish, eggs, vegetables, rice, vegetables salads, soups, sauces Bay Leaf-Meat, fish, stews, poultry Burnet-Salad, vegetables (cucumber-like flavor) Caraway Seed-Bread, cookies, cottage cheese, meat, vegetables, cheese, rice Cardamon-Baked goods, fruit, soups Celery Powder or seed-Salads, salad dressings, sauces, meatloaf, soup, bread.Do not use  celery  salt Chervil-Meats, salads, fish, eggs, vegetables, cottage cheese (parsley-like flavor) Chili Power-Meatloaf, chicken cheese, corn, eggplant, egg dishes Chives-Salads cottage cheese, egg dishes, soups, vegetables, sauces Cilantro-Salsa, casseroles Cinnamon-Baked goods, fruit, pork, lamb, chicken, carrots Cloves-Fruit, baked goods, fish, pot roast, green beans, beets, carrots Coriander-Pastry, cookies, meat, salads, cheese (lemon-orange flavor) Cumin-Meatloaf, fish,cheese, eggs, cabbage,fruit pie (caraway flavor) [employer], fruit, eggs, fish, poultry, cottage cheese, vegetables Dill Seed-Meat, cottage cheese, poultry, vegetables, fish, salads, bread Fennel Seed-Bread, cookies, apples, pork, eggs, fish, beets, cabbage, cheese, Licorice-like flavor Garlic-(buds or powder) Salads, meat, poultry, fish, bread, butter, vegetables, potatoes.Do not  use garlic salt Ginger-Fruit, vegetables, baked goods, meat, fish, poultry Horseradish Root-Meet, vegetables, butter Lemon Juice or Extract-Vegetables, fruit, tea, baked goods, fish salads Mace-Baked goods fruit, vegetables, fish, poultry (taste like nutmeg) Maple Extract-Syrups Marjoram-Meat, chicken, fish, vegetables, breads, green salads (taste like Sage) Mint-Tea, lamb, sherbet, vegetables, desserts, carrots, cabbage Mustard, Dry or Seed-Cheese, eggs, meats, vegetables, poultry Nutmeg-Baked goods, fruit, chicken, eggs, vegetables, desserts Onion Powder-Meat, fish, poultry, vegetables, cheese, eggs, bread, rice salads (Do not use   Onion salt) Orange Extract-Desserts, baked goods Oregano-Pasta, eggs, cheese, onions, pork, lamb, fish, chicken, vegetables, green salads Paprika-Meat, fish, poultry, eggs, cheese, vegetables Parsley Flakes-Butter, vegetables, meat fish, poultry, eggs, bread, salads (certain forms may   Contain sodium Pepper-Meat fish, poultry, vegetables, eggs Peppermint Extract-Desserts, baked goods Poppy Seed-Eggs, bread,  cheese, fruit dressings, baked goods, noodles, vegetables, cottage  [employer], poultry, meat, fish, cauliflower, turnips,eggs bread Saffron-Rice, bread, veal, chicken, fish, eggs Sage-Meat, fish, poultry, onions, eggplant, tomateos, pork, stews Savory-Eggs, salads, poultry, meat, rice, vegetables, soups, pork Tarragon-Meat, poultry, fish, eggs, butter, vegetables (licorice-like flavor)  Thyme-Meat, poultry, fish, eggs, vegetables, (clover-like flavor), sauces, soups Tumeric-Salads, butter, eggs, fish, rice, vegetables (saffron-like flavor) Vanilla Extract-Baked goods, candy Vinegar-Salads, vegetables, meat marinades Walnut Extract-baked goods, candy  2. Choose your Foods Wisely   The following is a list of foods to avoid which are high in sodium:  Meats-Avoid all smoked, canned, salt cured, dried and kosher meat and fish as well as Anchovies   [employer] meats:Bologna, Liverwurst, Pastrami Canned meat or fish  Marinated herring Caviar    Pepperoni Corned Beef   Pizza Dried chipped beef  Salami Frozen breaded fish or meat Salt pork Frankfurters or hot dogs  Sardines Gefilte fish   Sausage Ham (boiled ham, Proscuitto Smoked butt    spiced ham)   Spam      TV Dinners Vegetables Canned vegetables (Regular) Relish Canned mushrooms  Sauerkraut Olives    Tomato juice Pickles  Bakery and Dessert Products Canned puddings  Cream pies Cheesecake   Decorated cakes Cookies  Beverages/Juices Tomato juice, regular  Gatorade   V-8 vegetable juice, regular  Breads and Cereals Biscuit mixes   Salted potato chips, corn chips, pretzels Bread stuffing mixes  Salted crackers and rolls Pancake and waffle mixes Self-rising flour  Seasonings Accent    Meat sauces Barbecue sauce  Meat tenderizer Catsup    Monosodium glutamate (MSG) Celery salt   Onion salt Chili  sauce   Prepared mustard Garlic salt   Salt, seasoned salt, sea  salt Gravy mixes   Soy sauce Horseradish   Steak sauce Ketchup   Tartar sauce Lite salt    Teriyaki sauce Marinade mixes   Worcestershire sauce  Others Baking powder   Cocoa and cocoa mixes Baking soda   Commercial casserole mixes Candy-caramels, chocolate  Dehydrated soups    Bars, fudge,nougats  Instant rice and pasta mixes Canned broth or soup  Maraschino cherries Cheese, aged and processed cheese and cheese spreads  Learning Assessment Quiz  Indicated T (for True) or F (for False) for each of the following statements:  1. _____ Fresh fruits and vegetables and unprocessed grains are generally low in sodium 2. _____ Water may contain a considerable amount of sodium, depending on the source 3. _____ You can always tell if a food is high in sodium by tasting it 4. _____ Certain laxatives my be high in sodium and should be avoided unless prescribed   by a physician or pharmacist 5. _____ Salt substitutes may be used freely by anyone on a sodium restricted diet 6. _____ Sodium is present in table salt, food additives and as a natural component of   most foods 7. _____ Table salt is approximately 90% sodium 8. _____ Limiting sodium intake may help prevent excess fluid accumulation in the body 9. _____ On a sodium-restricted diet, seasonings such as bouillon soy sauce, and    cooking wine should be used in place of table salt 10. _____ On an ingredient list, a product which lists monosodium glutamate as the first   ingredient is an appropriate food to include on a low sodium diet  Circle the best answer(s) to the following statements (Hint: there may be more than one correct answer)  11. On a low-sodium diet, some acceptable snack items are:    A. Olives  F. Bean dip   K. Grapefruit juice    B. Salted Pretzels G. Commercial Popcorn   L. Canned peaches    C. Carrot Sticks  H. Bouillon   M. Unsalted nuts   D. [place] fries  I. Peanut butter crackers N. Salami   E. Sweet pickles J.  Tomato Juice   O. Pizza  12.  Seasonings that may be used freely on a reduced - sodium diet include   A. Lemon wedges F.Monosodium glutamate K. Celery seed    B.Soysauce   G. Pepper   L. Mustard powder   C. Sea salt  H. Cooking wine  M. Onion flakes   D. Vinegar  E. Prepared horseradish N. Salsa   E. Sage   J. Worcestershire sauce  O. Chutney

## 2019-12-12 ENCOUNTER — Ambulatory Visit: Payer: Medicare Other | Admitting: Physician Assistant

## 2019-12-13 ENCOUNTER — Telehealth: Payer: Self-pay | Admitting: Cardiology

## 2019-12-13 NOTE — Telephone Encounter (Signed)
Pt calling in to make an appt with Dr. Meda Coffee, [name] [name], or another APP in the office this week. Pt is complaining of sob last night and this morning.  Pt states he also gained 2 lbs in a 24 hour time frame, with his current weight still at baseline at 213 lbs. Pt states he is also getting dizzy when standing.  Pt denies any chest pain, N/V, diaphoresis, weakness, blurred vision, headache, pre-syncopal or syncopal episodes.  Pt reports he has no lower extremity edema in either extremity. Pt states he does feel very shakey and nervous inside, but that could be his diabetes.  Pt states his BS right now is 68 and he is getting ready to eat breakfast.  Pt states his current BP/HR at this time is:  130/77 and 64.  Scheduled the pt to come into the office to see Vin Bhagat [name] for tomorrow 12/14/19 at 3:15 pm.  Pt is aware to arrive 15 mins prior to this visit.  Advised the pt that in the meantime, between now and his visit tomorrow, if his symptoms worsen, he should seek immediate medical attention or call 911. Pt education provided on what would warrant immediate medical attention.  Advised the pt to continue his current medical regimen. Advised the pt to restrict sodium in his diet today, to prevent further fluid retention.  Informed the pt that Dr. Meda Coffee is out of the office this week, but I will still route this message to her as a general FYI.  Pt verbalized understanding and agrees with this plan.

## 2019-12-13 NOTE — Telephone Encounter (Signed)
  Pt c/o swelling: STAT is pt has developed SOB within 24 hours  1) How much weight have you gained and in what time span? 2 pounds in a day  2) If swelling, where is the swelling located? no  3) Are you currently taking a fluid pill? yes  4) Are you currently SOB? yes  5) Do you have a log of your daily weights (if so, list)? Yesterday 211lbs, today 213lbs  6) Have you gained 3 pounds in a day or 5 pounds in a week? No, 2 pounds in a day  7) Have you traveled recently? No  Patient states he has gained 2 lbs over night and has been SOB since last night. He states his blood sugar is 61, but is not feeling shaky. He also states he feels a little dizzy.

## 2019-12-13 NOTE — Progress Notes (Signed)
Cardiology Office Note    Date:  12/14/2019   ID:  Fernando Hess, Fernando Hess [birth year], MRN [id number]  PCP:  [name], MD  Cardiologist: Dr. Meda Coffee  PV: Dr. Gwenlyn Found   Chief Complaint: SOB and weight gain   History of Present Illness:   Fernando Hess is a 74 y.o. male with hx of CAD, HTN, HLD, DM, CKD IV, OSA, PAD and chronic diastolic CHF presents for SOB and weight gain.   Hx of CAD S/p MI in 09/2015 >> s/p CABG. Had inferolateral STEMI 10/2015 >> S-OM1 and S-OM2 occluded; dOM occluded >> med Rx.   Hx of bilateral tibial disease >> recommended medical therapy with Pletal.   Admitted 11/2019 with acute diastolic CHF. Echo showed LVEF 60-65, moderate LVH, GR 1 DD, no RWMA, mild AI, mild MR, mildly dilated ascending aorta (39 mm). ARB held due to elevated renal function. He was doing well when seen by [name], PA 12/02/19 follow up.   Here today for SOB and weight gain.  Patient gained 3 pounds in past 2 days after eating canned peaches.  He has no orthopnea, PND or lower extremity edema.  Compliant with medication.  He has lost 7 pounds since last office visit (213>>206lb).   Past Medical History:  Diagnosis Date  . Atrial premature complexes   . CAD (coronary artery disease), native coronary artery cardiologist-- dr Fernando Hess. nelson   hx NSTEMI 09-24-2015  s/p  CABG x5 on 10-01-2017;  post op STEMI inferolateral wall,  SVG OM1 and SVG OM2 occluded, distal OM occlusion the calpruit, treated medically  . CKD (chronic kidney disease), stage III   . Erectile dysfunction   . Esophageal reflux   . History of atrial fibrillation    post op CABG 10-02-2015  . History of kidney stones   . History of non-ST elevation myocardial infarction (NSTEMI) 09/24/2015   s/p  CABG x5  . History of ST elevation myocardial infarction (STEMI) 10/22/2015   inferior wall,  post op CABG 10-02-2015  . Hyperlipidemia   . Hypertension   . Kidney stone    h/o  . Left ureteral stone   . Mild  atherosclerosis of both carotid arteries   . Nephrolithiasis    per CT bilateral non-obstructive calculi  . Peripheral artery disease    LE Arterial US 01/2019: R PTA and ATA occluded; L ATA occluded  . RBBB (right bundle branch block)   . Renal atrophy, right   . Sleep apnea    wears cpap   . ST elevation myocardial infarction (STEMI) of inferior wall (Fernando Hess) 10/22/2015  . Type 2 diabetes mellitus treated with insulin ([place])    followed by pcp  . Wears glasses     Past Surgical History:  Procedure Laterality Date  . APPENDECTOMY  1965  . CARDIAC CATHETERIZATION N/A 09/26/2015   Procedure: Left Heart Cath and Coronary Angiography;  Surgeon: Troy Sine, MD;  Location: [place] [facility];  Service: Cardiovascular;  Laterality: N/A;  . CARDIAC CATHETERIZATION N/A 10/22/2015   Procedure: Left Heart Cath and Coronary Angiography;  Surgeon: Sherren Mocha, MD;  Location: [place] [facility];  Service: Cardiovascular;  Laterality: N/A;  . CATARACT EXTRACTION W/ INTRAOCULAR LENS  IMPLANT, BILATERAL  2017  . COLONOSCOPY    . CORONARY ARTERY BYPASS GRAFT N/A 10/02/2015   Procedure: CORONARY ARTERY BYPASS GRAFTING (CABG) X5 LIMA-LAD; SVG-DIAG; SVG-OM; SVG-PD; SVG-RAMUS TRANSESOPHAGEAL ECHOCARDIOGRAM (TEE) ENDOSCOPIC GREATER SAPHENOUS VEIN HARVEST BILAT LE;  Surgeon:  Ivin Poot, MD;  Location: [place];  Service: Open Heart Surgery;  Laterality: N/A;  . CYSTOSCOPY/URETEROSCOPY/HOLMIUM LASER/STENT PLACEMENT Left 08/10/2018   Procedure: CYSTOSCOPY/URETEROSCOPY/HOLMIUM LASER/STENT PLACEMENT;  Surgeon: Festus Aloe, MD;  Location: [facility];  Service: Urology;  Laterality: Left;  . CYSTOSCOPY/URETEROSCOPY/HOLMIUM LASER/STENT PLACEMENT Left 09/10/2018   Procedure: CYSTOSCOPY/URETEROSCOPY/HOLMIUM LASER/STENT EXCHANGE;  Surgeon: Festus Aloe, MD;  Location: [facility];  Service: Urology;  Laterality: Left;  . LEFT HEART CATHETERIZATION WITH CORONARY ANGIOGRAM N/A 04/13/2014   Procedure:  LEFT HEART CATHETERIZATION WITH CORONARY ANGIOGRAM;  Surgeon: Jacolyn Reedy, MD;  Location: [facility];  Service: Cardiovascular;  Laterality: N/A;  . LEG SURGERY Right age 12   closed reduction leg fracture  . POLYPECTOMY    . TEE WITHOUT CARDIOVERSION N/A 10/02/2015   Procedure: TRANSESOPHAGEAL ECHOCARDIOGRAM (TEE);  Surgeon: Ivin Poot, MD;  Location: [place];  Service: Open Heart Surgery;  Laterality: N/A;  . URETEROSCOPY WITH HOLMIUM LASER LITHOTRIPSY Bilateral 2004;  2005  dr grapey  [email or web address]   . VASECTOMY      Current Medications: Prior to Admission medications   Medication Sig Start Date End Date Taking? Authorizing Provider  amoxicillin (AMOXIL) 875 MG tablet Take 875 mg by mouth 2 (two) times daily. 11/29/19   [provider]  aspirin 81 MG chewable tablet Chew 1 tablet (81 mg total) by mouth daily. 10/25/15   Jacolyn Reedy, MD  atorvastatin (LIPITOR) 40 MG tablet Take 40 mg by mouth every morning.     [provider]  B-D UF III MINI PEN NEEDLES 31G X 5 MM MISC USE AS DIRECTED WITH LANTUS SOLOSTAR 04/02/19   [provider]  busPIRone (BUSPAR) 10 MG tablet Take 1 tablet (10 mg total) by mouth at bedtime. 11/23/19   Lomax, Amy, NP  DULoxetine (CYMBALTA) 30 MG capsule Take 30 mg by mouth at bedtime.    [provider]  DULoxetine (CYMBALTA) 60 MG capsule Take 60 mg by mouth daily.  04/12/18   [provider]  furosemide (LASIX) 20 MG tablet Take 1 tablet (20 mg total) by mouth daily. 11/21/19   Theora Gianotti, NP  glimepiride (AMARYL) 4 MG tablet Take 4 mg by mouth 2 (two) times daily.     [provider]  Insulin Glargine (BASAGLAR KWIKPEN) 100 UNIT/ML SOPN Inject 31 Units into the skin daily.     [provider]  Melatonin 3 MG TABS Take 1 tablet by mouth daily as needed.    [provider]  metoprolol succinate (TOPROL-XL) 25 MG 24 hr tablet TAKE 1/2 TABLET BY MOUTH EVERY DAY 03/17/19   Dorothy Spark, MD  Multiple Vitamin (MULTIVITAMIN) tablet Take 1 tablet by mouth daily.    [provider]  nitroGLYCERIN (NITROSTAT) 0.4 MG SL tablet Place 0.4 mg under the tongue every 5 (five) minutes as needed for chest pain.    [provider]  ondansetron (ZOFRAN-ODT) 4 MG disintegrating tablet Take 4 mg by mouth every 8 (eight) hours as needed for nausea or vomiting.     [provider]  [facility] ULTRA test strip FOR USE OF [place] BLOOD ONCE DAILY 03/18/19   [provider]  pantoprazole (PROTONIX) 40 MG tablet TAKE 1 TABLET BY MOUTH EVERY DAY 11/25/19   [name], MD  pioglitazone (ACTOS) 45 MG tablet Take 45 mg by mouth daily.    [provider]    Allergies:   Levaquin [levofloxacin], Lisinopril, and Victoza [liraglutide]  Social History   Socioeconomic History  . Marital status: Married    Spouse name: Not on file  . Number of children: Not on file  . Years of education: Not on file  . Highest education level: Not on file  Occupational History  . Occupation: retired  Tobacco Use  . Smoking status: Never Smoker  . Smokeless tobacco: Never Used  Substance and Sexual Activity  . Alcohol use: Not Currently  . Drug use: Never  . Sexual activity: Not on file  Other Topics Concern  . 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 Difficulty of Paying Living Expenses:   Food Insecurity:   . Worried About Charity fundraiser in the Last Year:   . Arboriculturist in the Last Year:   Transportation Needs:   . Film/video editor (Medical):   [name] Lack of Transportation (Non-Medical):   Physical Activity:   . Days of Exercise per Week:   . Minutes of Exercise per Session:   Stress:   . Feeling of Stress :   Social Connections:   . Frequency of Communication with Friends and Family:   . Frequency of Social  Gatherings with Friends and Family:   . Attends Religious Services:   . Active Member of Clubs or Organizations:   . Attends Archivist Meetings:   [name] Marital Status:      Family History:  The patient's family history includes Diabetes in his brother and brother; Heart attack in his father and mother; Pancreatic cancer in his brother.  ROS:   Please see the history of present illness.    ROS All other systems reviewed and are negative.   PHYSICAL EXAM:   VS:  BP 130/70   Pulse 77   Ht 5\' 6"  (1.676 m)   Wt 206 lb (93.4 kg)   SpO2 94%   BMI 33.25 kg/m    GEN: Well nourished, well developed, in no acute distress  HEENT: normal  Neck: no JVD, carotid bruits, or masses Cardiac: RRR; no murmurs, rubs, or gallops,no edema  Respiratory: Faint left base crackles GI: soft, nontender, nondistended, + BS MS: no deformity or atrophy  Skin: warm and dry, no rash Neuro:  Alert and Oriented x 3, Strength and sensation are intact Psych: euthymic mood, full affect  Wt Readings from Last 3 Encounters:  12/14/19 206 lb (93.4 kg)  12/02/19 213 lb (96.6 kg)  11/23/19 231 lb 7.7 oz (105 kg)      Studies/Labs Reviewed:   EKG:  EKG is not  ordered today.    Recent Labs: 11/23/2019: Hemoglobin 13.4; Platelets 213 11/24/2019: B Natriuretic Peptide 84.7 12/02/2019: BUN 19; Creatinine, Ser 1.92; Potassium 4.0; Sodium 140   Lipid Panel    Component Value Date/Time   CHOL 81 10/23/2015 0515   TRIG 102 10/23/2015 0515   HDL 30 (L) 10/23/2015 0515   CHOLHDL 2.7 10/23/2015 0515   VLDL 20 10/23/2015 0515   LDLCALC 31 10/23/2015 0515    Additional studies/ records that were reviewed today include:   Echocardiogram: 11/19/19 1. Normal LV systolic function; moderate LVH; grade 1 diastolic  dysfunction; mildly dilated ascending aorta; mild AI and MR.  2. Left ventricular ejection fraction, by estimation, is 60 to 65%. The  left ventricle has normal function. The left  ventricle has no  regional  wall motion abnormalities. There is moderate left ventricular hypertrophy.  Left ventricular diastolic  parameters are consistent with Grade I diastolic dysfunction (impaired  relaxation).  3. Right ventricular systolic function is normal. The right ventricular  size is normal.  4. The mitral valve is normal in structure and function. Mild mitral  valve regurgitation. No evidence of mitral stenosis.  5. The aortic valve is normal in structure and function. Aortic valve  regurgitation is mild. Mild to moderate aortic valve  sclerosis/calcification is present, without any evidence of aortic  stenosis.  6. Aortic dilatation noted. There is mild dilatation of the ascending  aorta measuring 39 mm.  7. The inferior vena cava is normal in size with greater than 50%  respiratory variability, suggesting right atrial pressure of 3 mmHg.   Stress test 10/2018  Nuclear stress EF: 52%.  There was no ST segment deviation noted during stress.  Defect 1: There is a small defect of mild severity present in the apex location.  This is a low risk study.  The left ventricular ejection fraction is mildly decreased (45-54%).   Low risk, mildly abnormal stress nuclear study with apical thinning but no ischemia; EF 52 with mild global hypokinesis.  Left Heart Cath and Coronary Angiography  10/22/2015  Conclusion   Ost LM to LM lesion, 50% stenosed.  Mid LAD lesion, 90% stenosed.  The LIMA to LAD graft is widely patent  Mid RCA lesion, 50% stenosed.  SVG .  The saphenous vein graft to right PDA is widely patent without stenosis  SVG .  The saphenous vein graft to first diagonal is widely patent. This graft retrograde fills the LAD territory, with relatively brisk flow into the proximal septal perforators.  Ost Ramus to Ramus lesion, 90% stenosed.  Ost 1st Mrg to 1st Mrg lesion, 80% stenosed.  Prox Cx lesion, 80% stenosed.  2nd Mrg lesion, 100% stenosed.  SVG .  This  graft is occluded just beyond the aortic anastomosis  This graft is occluded in the proximal body  Origin to Prox Graft lesion, 100% stenosed.  Prox Graft lesion, 100% stenosed.   1. 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 S/P recent CABG with patency of the LIMA-LAD, SVG-PDA, and SVG-diagonal 3. Total occlusion of the SVG-OM1 and SVG-OM2 4. Total occlusion of the distal OM2, suspected culprit for STEMI  Recommend: medical therapy, add clopidogrel, resume heparin in 8 hrs. With diffuse diabetic CAD, may be best to manage medically rather than proceed with complex PCI of the left circumflex branches. I felt best not to acutely intervene on the distal OM2 as the vessel terminates not far beyond the area of occlusion based on preop coronary angiogram review.      ASSESSMENT & PLAN:    1. Chronic diastolic heart failure Overall he has been doing well.  Again 3 pound weight  after eating canned food.  Overall he has lost 7 pounds since last office visit.  Advised to keep an eye on sodium intake.  He will take extra Lasix tomorrow if weight continues to go up.  2.  Chronic kidney disease stage IV -Kidney function normal on last check. -Followed by nephrology  3.  CAD -No angina.  Continue current medical therapy  4.  Hypertension -Blood pressure stable on current medications.  Medication Adjustments/Labs and Tests Ordered: Current medicines are reviewed at length with the patient today.  Concerns  regarding medicines are outlined above.  Medication changes, Labs and Tests ordered today are listed in the Patient Instructions below. Patient Instructions  Medication Instructions:  Your physician recommends that you continue on your current medications as directed. Please refer to the Current Medication list given to you today.  *If you need a refill on your cardiac medications before your next appointment, please call  your pharmacy*   Lab Work: None  If you have labs (blood work) drawn today and your tests are completely normal, you will receive your results only by: [name] MyChart Message (if you have MyChart) OR . A paper copy in the mail If you have any lab test that is abnormal or we need to change your treatment, we will call you to review the results.   Testing/Procedures: None   Follow-Up: At [facility], you and your health needs are our priority.  As part of our continuing mission to provide you with exceptional heart care, we have created designated Provider Care Teams.  These Care Teams include your primary Cardiologist (physician) and Advanced Practice Providers (APPs -  Physician Assistants and Nurse Practitioners) who all work together to provide you with the care you need, when you need it.  We recommend signing up for the patient portal called "MyChart".  Sign up information is provided on this After Visit Summary.  MyChart is used to connect with patients for Virtual Visits (Telemedicine).  Patients are able to view lab/test results, encounter notes, upcoming appointments, etc.  Non-urgent messages can be sent to your provider as well.   To learn more about what you can do with MyChart, go to [email or web address].    Your next appointment:   03/09/2020 @ 8:15 with [name], PA  The format for your next appointment:   In Person       Jarrett Soho, [place]  12/14/2019 3:46 PM    [address], [place], [place]  [address] Phone: [phone]; Fax: [phone]

## 2019-12-14 ENCOUNTER — Ambulatory Visit (INDEPENDENT_AMBULATORY_CARE_PROVIDER_SITE_OTHER): Payer: Medicare Other | Admitting: Physician Assistant

## 2019-12-14 ENCOUNTER — Encounter: Payer: Self-pay | Admitting: Physician Assistant

## 2019-12-14 ENCOUNTER — Ambulatory Visit: Payer: Medicare Other | Admitting: Internal Medicine

## 2019-12-14 ENCOUNTER — Other Ambulatory Visit: Payer: Self-pay

## 2019-12-14 VITALS — BP 130/70 | HR 77 | Ht 66.0 in | Wt 206.0 lb

## 2019-12-14 DIAGNOSIS — I5032 Chronic diastolic (congestive) heart failure: Secondary | ICD-10-CM | POA: Diagnosis not present

## 2019-12-14 DIAGNOSIS — I10 Essential (primary) hypertension: Secondary | ICD-10-CM

## 2019-12-14 DIAGNOSIS — N184 Chronic kidney disease, stage 4 (severe): Secondary | ICD-10-CM

## 2019-12-14 DIAGNOSIS — I25810 Atherosclerosis of coronary artery bypass graft(s) without angina pectoris: Secondary | ICD-10-CM

## 2019-12-14 NOTE — Patient Instructions (Signed)
Medication Instructions:  Your physician recommends that you continue on your current medications as directed. Please refer to the Current Medication list given to you today.  *If you need a refill on your cardiac medications before your next appointment, please call your pharmacy*   Lab Work: None  If you have labs (blood work) drawn today and your tests are completely normal, you will receive your results only by: [name] MyChart Message (if you have MyChart) OR . A paper copy in the mail If you have any lab test that is abnormal or we need to change your treatment, we will call you to review the results.   Testing/Procedures: None   Follow-Up: At [facility], you and your health needs are our priority.  As part of our continuing mission to provide you with exceptional heart care, we have created designated Provider Care Teams.  These Care Teams include your primary Cardiologist (physician) and Advanced Practice Providers (APPs -  Physician Assistants and Nurse Practitioners) who all work together to provide you with the care you need, when you need it.  We recommend signing up for the patient portal called "MyChart".  Sign up information is provided on this After Visit Summary.  MyChart is used to connect with patients for Virtual Visits (Telemedicine).  Patients are able to view lab/test results, encounter notes, upcoming appointments, etc.  Non-urgent messages can be sent to your provider as well.   To learn more about what you can do with MyChart, go to [email or web address].    Your next appointment:   03/09/2020 @ 8:15 with [name], PA  The format for your next appointment:   In Person

## 2019-12-21 ENCOUNTER — Encounter (INDEPENDENT_AMBULATORY_CARE_PROVIDER_SITE_OTHER): Payer: Self-pay | Admitting: Ophthalmology

## 2019-12-21 ENCOUNTER — Other Ambulatory Visit: Payer: Self-pay

## 2019-12-21 ENCOUNTER — Ambulatory Visit (INDEPENDENT_AMBULATORY_CARE_PROVIDER_SITE_OTHER): Payer: Medicare Other | Admitting: Ophthalmology

## 2019-12-21 DIAGNOSIS — D3131 Benign neoplasm of right choroid: Secondary | ICD-10-CM | POA: Insufficient documentation

## 2019-12-21 DIAGNOSIS — E113311 Type 2 diabetes mellitus with moderate nonproliferative diabetic retinopathy with macular edema, right eye: Secondary | ICD-10-CM

## 2019-12-21 DIAGNOSIS — Z9989 Dependence on other enabling machines and devices: Secondary | ICD-10-CM

## 2019-12-21 DIAGNOSIS — G4733 Obstructive sleep apnea (adult) (pediatric): Secondary | ICD-10-CM

## 2019-12-21 MED ORDER — BEVACIZUMAB CHEMO INJECTION 1.25MG/0.05ML SYRINGE FOR KALEIDOSCOPE
1.2500 mg | INTRAVITREAL | Status: AC | PRN
  Administered 2019-12-21: 10:00:00 1.25 mg via INTRAVITREAL

## 2019-12-21 NOTE — Assessment & Plan Note (Signed)
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  Photo documentation was discussed with the patient.  Periodic follow-up may be needed for a lifetime. The patient's questions were answered. At minimum, annual exams will be needed if no signs of early growth. 

## 2019-12-21 NOTE — Assessment & Plan Note (Signed)
The nature of moderate nonproliferative diabetic retinopathy was discussed with the patient as well as the need for more frequent follow up to judge for progression. Good blood glucose, blood pressure, and serum lipid control was recommended as well as avoidance of smoking and maintenance of normal weight.  Close follow up with PCP encouraged. °

## 2019-12-21 NOTE — Progress Notes (Signed)
12/21/2019     CHIEF COMPLAINT Patient presents for Diabetic Retinopathy without Macular Edema and Diabetic Eye Exam   HISTORY OF PRESENT ILLNESS: Fernando Hess is a 74 y.o. male who presents to the clinic today for:   HPI    Diabetic Eye Exam    Vision is stable.  Diabetes characteristics include Type 2.  This started 5 weeks ago.  Blood sugar level is controlled.  Last Blood Glucose 258 (This AM).  Last A1C 8.5 (Pt does not recall when).          Comments    5 Week Diabetic exam OD, poss Avastin OD - OCT  Pt denies noticeable changes to [place] OU since last visit. Pt denies ocular pain, flashes of light, or floaters OU.         Last edited by Hurman Horn, MD on 12/21/2019 10:10 AM. (History)      Referring physician: [name], MD [place],  [place] [id number]  HISTORICAL INFORMATION:   Selected notes from the MEDICAL RECORD NUMBER    Lab Results  Component Value Date   HGBA1C 8.7 (H) 11/19/2019     CURRENT MEDICATIONS: No current outpatient medications on file. (Ophthalmic Drugs)   No current facility-administered medications for this visit. (Ophthalmic Drugs)   Current Outpatient Medications (Other)  Medication Sig  . amoxicillin (AMOXIL) 875 MG tablet Take 875 mg by mouth 2 (two) times daily.  [name] aspirin 81 MG chewable tablet Chew 1 tablet (81 mg total) by mouth daily.  [name] atorvastatin (LIPITOR) 40 MG tablet Take 40 mg by mouth every morning.   . B-D UF III MINI PEN NEEDLES 31G X 5 MM MISC USE AS DIRECTED WITH LANTUS SOLOSTAR  . busPIRone (BUSPAR) 10 MG tablet Take 1 tablet (10 mg total) by mouth at bedtime.  . DULoxetine (CYMBALTA) 30 MG capsule Take 30 mg by mouth at bedtime.  . DULoxetine (CYMBALTA) 60 MG capsule Take 60 mg by mouth daily.   . furosemide (LASIX) 20 MG tablet Take 1 tablet (20 mg total) by mouth daily.  [name] glimepiride (AMARYL) 4 MG tablet Take 4 mg by mouth 2 (two) times daily.   . Insulin Glargine (BASAGLAR KWIKPEN)  100 UNIT/ML SOPN Inject 31 Units into the skin daily.   . Melatonin 3 MG TABS Take 1 tablet by mouth daily as needed.  . metoprolol succinate (TOPROL-XL) 25 MG 24 hr tablet TAKE 1/2 TABLET BY MOUTH EVERY DAY  . Multiple Vitamin (MULTIVITAMIN) tablet Take 1 tablet by mouth daily.  . nitroGLYCERIN (NITROSTAT) 0.4 MG SL tablet Place 0.4 mg under the tongue every 5 (five) minutes as needed for chest pain.  [name] ondansetron (ZOFRAN-ODT) 4 MG disintegrating tablet Take 4 mg by mouth every 8 (eight) hours as needed for nausea or vomiting.   Glory Rosebush ULTRA test strip FOR USE OF CHEACKING BLOOD ONCE DAILY  . pantoprazole (PROTONIX) 40 MG tablet TAKE 1 TABLET BY MOUTH EVERY DAY  . pioglitazone (ACTOS) 45 MG tablet Take 45 mg by mouth daily.   No current facility-administered medications for this visit. (Other)      REVIEW OF SYSTEMS:    ALLERGIES Allergies  Allergen Reactions  . Levaquin [Levofloxacin] Itching and Rash  . Lisinopril Itching and Rash  . Victoza [Liraglutide]     Severe fatigue & insomnia    PAST MEDICAL HISTORY Past Medical History:  Diagnosis Date  . Atrial premature complexes   . CAD (coronary  artery disease), native coronary artery cardiologist-- dr Raliegh Ip. nelson   hx NSTEMI 09-24-2015  s/p  CABG x5 on 10-01-2017;  post op STEMI inferolateral wall,  SVG OM1 and SVG OM2 occluded, distal OM occlusion the calpruit, treated medically  . CKD (chronic kidney disease), stage III   . Erectile dysfunction   . Esophageal reflux   . History of atrial fibrillation    post op CABG 10-02-2015  . History of kidney stones   . History of non-ST elevation myocardial infarction (NSTEMI) 09/24/2015   s/p  CABG x5  . History of ST elevation myocardial infarction (STEMI) 10/22/2015   inferior wall,  post op CABG 10-02-2015  . Hyperlipidemia   . Hypertension   . Kidney stone    h/o  . Left ureteral stone   . Mild atherosclerosis of both carotid arteries   . Nephrolithiasis    per CT  bilateral non-obstructive calculi  . Peripheral artery disease    LE Arterial US 01/2019: R PTA and ATA occluded; L ATA occluded  . RBBB (right bundle branch block)   . Renal atrophy, right   . Sleep apnea    wears cpap   . ST elevation myocardial infarction (STEMI) of inferior wall ([place]) 10/22/2015  . Type 2 diabetes mellitus treated with insulin ([place])    followed by pcp  . Wears glasses    Past Surgical History:  Procedure Laterality Date  . APPENDECTOMY  1965  . CARDIAC CATHETERIZATION N/A 09/26/2015   Procedure: Left Heart Cath and Coronary Angiography;  Surgeon: Troy Sine, MD;  Location: [facility];  Service: Cardiovascular;  Laterality: N/A;  . CARDIAC CATHETERIZATION N/A 10/22/2015   Procedure: Left Heart Cath and Coronary Angiography;  Surgeon: Sherren Mocha, MD;  Location: [facility];  Service: Cardiovascular;  Laterality: N/A;  . CATARACT EXTRACTION W/ INTRAOCULAR LENS  IMPLANT, BILATERAL  2017  . COLONOSCOPY    . CORONARY ARTERY BYPASS GRAFT N/A 10/02/2015   Procedure: CORONARY ARTERY BYPASS GRAFTING (CABG) X5 LIMA-LAD; SVG-DIAG; SVG-OM; SVG-PD; SVG-RAMUS TRANSESOPHAGEAL ECHOCARDIOGRAM (TEE) ENDOSCOPIC GREATER SAPHENOUS VEIN HARVEST BILAT LE;  Surgeon: Ivin Poot, MD;  Location: [facility];  Service: Open Heart Surgery;  Laterality: N/A;  . CYSTOSCOPY/URETEROSCOPY/HOLMIUM LASER/STENT PLACEMENT Left 08/10/2018   Procedure: CYSTOSCOPY/URETEROSCOPY/HOLMIUM LASER/STENT PLACEMENT;  Surgeon: Festus Aloe, MD;  Location: [facility];  Service: Urology;  Laterality: Left;  . CYSTOSCOPY/URETEROSCOPY/HOLMIUM LASER/STENT PLACEMENT Left 09/10/2018   Procedure: CYSTOSCOPY/URETEROSCOPY/HOLMIUM LASER/STENT EXCHANGE;  Surgeon: Festus Aloe, MD;  Location: [facility];  Service: Urology;  Laterality: Left;  . LEFT HEART CATHETERIZATION WITH CORONARY ANGIOGRAM N/A 04/13/2014   Procedure: LEFT HEART CATHETERIZATION WITH CORONARY ANGIOGRAM;  Surgeon: Jacolyn Reedy, MD;  Location: [facility];  Service: Cardiovascular;  Laterality: N/A;  . LEG SURGERY Right age 11   closed reduction leg fracture  . POLYPECTOMY    . TEE WITHOUT CARDIOVERSION N/A 10/02/2015   Procedure: TRANSESOPHAGEAL ECHOCARDIOGRAM (TEE);  Surgeon: Ivin Poot, MD;  Location: [facility];  Service: Open Heart Surgery;  Laterality: N/A;  . URETEROSCOPY WITH HOLMIUM LASER LITHOTRIPSY Bilateral 2004;  2005  dr grapey  @[facility]   . VASECTOMY      FAMILY HISTORY Family History  Problem Relation Age of Onset  . Heart attack Mother   . Heart attack Father   . Diabetes Brother   . Pancreatic cancer Brother   . Diabetes Brother   . Colon cancer Neg Hx   . Esophageal cancer Neg Hx   .  Prostate cancer Neg Hx   . Rectal cancer Neg Hx   . Stomach cancer Neg Hx   . Colon polyps Neg Hx     SOCIAL HISTORY Social History   Tobacco Use  . Smoking status: Never Smoker  . Smokeless tobacco: Never Used  Substance Use Topics  . Alcohol use: Not Currently  . Drug use: Never         OPHTHALMIC EXAM:  Base Eye Exam    Visual Acuity (Snellen - Linear)      Right Left   Dist cc 20/25 -2 20/25 -1   Correction: Glasses       Tonometry (Tonopen, 9:00 AM)      Right Left   Pressure 15 16       Pupils      Pupils Dark Light Shape React APD   Right PERRL 4 3 Round Brisk None   Left PERRL 4 3 Round Brisk None       Visual Fields (Counting fingers)      Left Right    Full Full       Extraocular Movement      Right Left    Full Full       Neuro/Psych    Oriented x3: Yes   Mood/Affect: Normal       Dilation    Right eye: 1.0% Mydriacyl, 2.5% Phenylephrine @ 9:00 AM        Slit Lamp and Fundus Exam    External Exam      Right Left   External Normal Normal       Slit Lamp Exam      Right Left   Lids/Lashes Normal Normal   Conjunctiva/Sclera White and quiet White and quiet   Cornea Clear Clear   Anterior Chamber Deep and quiet Deep and quiet   Iris Round and  reactive Round and reactive   Lens Posterior chamber intraocular lens Posterior chamber intraocular lens   Vitreous Normal Normal       Fundus Exam      Right Left   Disc Normal Normal   C/D Ratio 0.5 0.5   Macula Macular thickening, Microaneurysms Macular thickening, Microaneurysms   Vessels Retinopathy,, moderated Retinopathy   Periphery Choroidal nevus, small in macula,, stable, 2 dd size, flat Normal          IMAGING AND PROCEDURES  Imaging and Procedures for 12/21/19  OCT, Retina - OU - Both Eyes       Right Eye Quality was good. Scan locations included subfoveal.   Left Eye Quality was good. Scan locations included subfoveal.   Notes Much less clinically significant neck edema the right eye at 5-week ago evaluation noted, and will reschedule follow-up visit in 8 weeks with dilation of OU and fluorescein angiography OU to monitor the peripheral retinal vasculature in the macular findings.         Intravitreal Injection, Pharmacologic Agent - OD - Right Eye       Time Out 12/21/2019. 10:20 AM. Confirmed correct patient, procedure, site, and patient consented.   Anesthesia Topical anesthesia was used. Anesthetic medications included Akten 3.5%.   Procedure Preparation included 5% betadine to ocular surface, 10% betadine to eyelids, Tobramycin 0.3%. A 30 gauge needle was used.   Injection:  1.25 mg Bevacizumab (AVASTIN) SOLN   NDC: 70360-001-02, Lot: [id number]   Route: Intravitreal, Site: Right Eye, Waste: 0 mg  Post-op Post injection exam found visual acuity of at least counting fingers. The  patient tolerated the procedure well. There were no complications. The patient received written and verbal post procedure care education. Post injection medications were not given.                 ASSESSMENT/PLAN:    ICD-10-CM   1. 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 OSA on CPAP  G47.33    Z99.89     1.  2.  3.  Ophthalmic Meds Ordered this visit:  Meds ordered this encounter  Medications  . Bevacizumab (AVASTIN) SOLN 1.25 mg       Return in about 8 weeks (around 02/15/2020) for DILATE OU, OCT, FFA OPTOS, COLOR FP.  There are no Patient Instructions on file for this visit.   Explained the diagnoses, plan, and follow up with the patient and they expressed understanding.  Patient expressed understanding of the importance of proper follow up care.   Clent Demark Buffey Zabinski M.D. Diseases & Surgery of the Retina and Vitreous Retina & Diabetic [place] 12/21/19     Abbreviations: M myopia (nearsighted); A astigmatism; H hyperopia (farsighted); P presbyopia; Mrx spectacle prescription;  CTL contact lenses; OD right eye; OS left eye; OU both eyes  XT exotropia; ET esotropia; PEK punctate epithelial keratitis; PEE punctate epithelial erosions; DES dry eye syndrome; MGD meibomian gland dysfunction; ATs artificial tears; PFAT's preservative free artificial tears; [place] nuclear sclerotic cataract; PSC posterior subcapsular cataract; ERM epi-retinal membrane; PVD posterior vitreous detachment; RD retinal detachment; DM diabetes mellitus; DR diabetic retinopathy; NPDR non-proliferative diabetic retinopathy; PDR proliferative diabetic retinopathy; CSME clinically significant macular edema; DME diabetic macular edema; dbh dot blot hemorrhages; CWS cotton wool spot; POAG primary open angle glaucoma; C/D cup-to-disc ratio; HVF humphrey visual field; GVF goldmann visual field; OCT optical coherence tomography; IOP intraocular pressure; BRVO Branch retinal vein occlusion; CRVO central retinal vein occlusion; CRAO central retinal artery occlusion; BRAO branch retinal artery occlusion; RT  retinal tear; SB scleral buckle; PPV pars plana vitrectomy; VH Vitreous hemorrhage; PRP panretinal laser photocoagulation; IVK intravitreal kenalog; VMT vitreomacular traction; MH Macular hole;  NVD neovascularization of the disc; NVE neovascularization elsewhere; AREDS age related eye disease study; ARMD age related macular degeneration; POAG primary open angle glaucoma; EBMD epithelial/anterior basement membrane dystrophy; ACIOL anterior chamber intraocular lens; IOL intraocular lens; PCIOL posterior chamber intraocular lens; Phaco/IOL phacoemulsification with intraocular lens placement; [place] photorefractive keratectomy; LASIK laser assisted in situ keratomileusis; HTN hypertension; DM diabetes mellitus; COPD chronic obstructive pulmonary disease

## 2019-12-21 NOTE — Assessment & Plan Note (Signed)
Dr. Zadie Rhine encourages patient to continue with CPAP use as it will benefit and slow progression of retinal vascular disease.

## 2020-01-23 ENCOUNTER — Telehealth: Payer: Self-pay | Admitting: Internal Medicine

## 2020-01-23 ENCOUNTER — Other Ambulatory Visit: Payer: Self-pay | Admitting: Cardiovascular Disease

## 2020-01-23 NOTE — Telephone Encounter (Signed)
Received a call from the patient and his wife that he is more short of breath. His weight has increased about 3 lbs. He states that he feels like he can't take a deep breath and feels more comfortable sitting upright. He took a lasix tablet about one hour ago and has urinated twice since. I recommended that he take an additional lasix to help with his fluid but he declined. We discussed evaluation in the ER but he declined as well. I instructed him to take another lasix in the morning if his weight was still up and he was still short of breath. He knows to come to the ER if his symptoms worsen or fail to improve.  Alric Quan, MD

## 2020-01-25 ENCOUNTER — Other Ambulatory Visit: Payer: Self-pay | Admitting: *Deleted

## 2020-01-25 MED ORDER — BUSPIRONE HCL 10 MG PO TABS: 10.0000 mg | ORAL_TABLET | Freq: Every day | ORAL | 2 refills | Status: DC

## 2020-01-29 ENCOUNTER — Emergency Department (HOSPITAL_COMMUNITY)
Admission: EM | Admit: 2020-01-29 | Discharge: 2020-01-29 | Disposition: A | Discharge status: Home / Self Care | Payer: Medicare Other | Attending: Emergency Medicine | Admitting: Emergency Medicine

## 2020-01-29 ENCOUNTER — Emergency Department (HOSPITAL_COMMUNITY): Payer: Medicare Other

## 2020-01-29 ENCOUNTER — Other Ambulatory Visit: Payer: Self-pay

## 2020-01-29 ENCOUNTER — Encounter (HOSPITAL_COMMUNITY): Payer: Self-pay | Admitting: Emergency Medicine

## 2020-01-29 DIAGNOSIS — I130 Hypertensive heart and chronic kidney disease with heart failure and stage 1 through stage 4 chronic kidney disease, or unspecified chronic kidney disease: Secondary | ICD-10-CM | POA: Diagnosis not present

## 2020-01-29 DIAGNOSIS — I5032 Chronic diastolic (congestive) heart failure: Secondary | ICD-10-CM | POA: Insufficient documentation

## 2020-01-29 DIAGNOSIS — Z794 Long term (current) use of insulin: Secondary | ICD-10-CM | POA: Insufficient documentation

## 2020-01-29 DIAGNOSIS — N184 Chronic kidney disease, stage 4 (severe): Secondary | ICD-10-CM | POA: Insufficient documentation

## 2020-01-29 DIAGNOSIS — Z951 Presence of aortocoronary bypass graft: Secondary | ICD-10-CM | POA: Diagnosis not present

## 2020-01-29 DIAGNOSIS — Z79899 Other long term (current) drug therapy: Secondary | ICD-10-CM | POA: Insufficient documentation

## 2020-01-29 DIAGNOSIS — E1122 Type 2 diabetes mellitus with diabetic chronic kidney disease: Secondary | ICD-10-CM | POA: Diagnosis not present

## 2020-01-29 DIAGNOSIS — I2510 Atherosclerotic heart disease of native coronary artery without angina pectoris: Secondary | ICD-10-CM | POA: Insufficient documentation

## 2020-01-29 DIAGNOSIS — M25552 Pain in left hip: Secondary | ICD-10-CM | POA: Insufficient documentation

## 2020-01-29 DIAGNOSIS — Z7982 Long term (current) use of aspirin: Secondary | ICD-10-CM | POA: Diagnosis not present

## 2020-01-29 LAB — URINALYSIS, ROUTINE W REFLEX MICROSCOPIC
Bacteria, UA: NONE SEEN
Bilirubin Urine: NEGATIVE
Glucose, UA: NEGATIVE mg/dL
Hgb urine dipstick: NEGATIVE
Ketones, ur: NEGATIVE mg/dL
Leukocytes,Ua: NEGATIVE
Nitrite: NEGATIVE
Protein, ur: 100 mg/dL — AB
Specific Gravity, Urine: 1.016 (ref 1.005–1.030)
pH: 6 (ref 5.0–8.0)

## 2020-01-29 LAB — CBG MONITORING, ED: Glucose-Capillary: 59 mg/dL — ABNORMAL LOW (ref 70–99)

## 2020-01-29 LAB — CBC WITH DIFFERENTIAL/PLATELET
Abs Immature Granulocytes: 0.05 10*3/uL (ref 0.00–0.07)
Basophils Absolute: 0.1 10*3/uL (ref 0.0–0.1)
Basophils Relative: 1 %
Eosinophils Absolute: 0.3 10*3/uL (ref 0.0–0.5)
Eosinophils Relative: 3 %
HCT: 42.7 % (ref 39.0–52.0)
Hemoglobin: 13.7 g/dL (ref 13.0–17.0)
Immature Granulocytes: 1 %
Lymphocytes Relative: 26 %
Lymphs Abs: 2.5 10*3/uL (ref 0.7–4.0)
MCH: 30.2 pg (ref 26.0–34.0)
MCHC: 32.1 g/dL (ref 30.0–36.0)
MCV: 94.3 fL (ref 80.0–100.0)
Monocytes Absolute: 1.2 10*3/uL — ABNORMAL HIGH (ref 0.1–1.0)
Monocytes Relative: 12 %
Neutro Abs: 5.5 10*3/uL (ref 1.7–7.7)
Neutrophils Relative %: 57 %
Platelets: 202 10*3/uL (ref 150–400)
RBC: 4.53 MIL/uL (ref 4.22–5.81)
RDW: 15 % (ref 11.5–15.5)
WBC: 9.6 10*3/uL (ref 4.0–10.5)
nRBC: 0 % (ref 0.0–0.2)

## 2020-01-29 MED ORDER — MORPHINE SULFATE (PF) 4 MG/ML IV SOLN
4.0000 mg | Freq: Once | INTRAVENOUS | Status: AC
  Administered 2020-01-29: 4 mg via INTRAVENOUS
  Filled 2020-01-29: qty 1

## 2020-01-29 MED ORDER — IBUPROFEN 600 MG PO TABS: 600.0000 mg | ORAL_TABLET | Freq: Four times a day (QID) | ORAL | 0 refills | Status: DC | PRN

## 2020-01-29 MED ORDER — ONDANSETRON HCL 4 MG/2ML IJ SOLN
4.0000 mg | Freq: Once | INTRAMUSCULAR | Status: AC
  Administered 2020-01-29: 4 mg via INTRAVENOUS
  Filled 2020-01-29: qty 2

## 2020-01-29 MED ORDER — LIDOCAINE 5 % EX PTCH
1.0000 | MEDICATED_PATCH | CUTANEOUS | Status: DC
  Administered 2020-01-29: 1 via TRANSDERMAL
  Filled 2020-01-29: qty 1

## 2020-01-29 MED ORDER — CYCLOBENZAPRINE HCL 10 MG PO TABS: 10.0000 mg | ORAL_TABLET | Freq: Two times a day (BID) | ORAL | 0 refills | Status: DC | PRN

## 2020-01-29 NOTE — ED Notes (Signed)
CBG addressed, pt. Provided meal and fluids. Nurse will continue to monitor CBG.

## 2020-01-29 NOTE — ED Triage Notes (Signed)
Pt c/o left hip pain that started Saturday when he woke up. Denies fall/injury, reports pain is worsening and it is becoming difficult to walk.

## 2020-01-29 NOTE — ED Notes (Signed)
Pt. Successfully ambulated in hall and toileted. Pt stated, pain feels much better than before. Pain assessment documented.

## 2020-01-29 NOTE — Discharge Instructions (Addendum)
Your hip pain may be due to a muscle skeletal strain.  Take medication prescribed.  Follow up with your Doctor for further care.  Return if you have any concern.

## 2020-01-29 NOTE — ED Provider Notes (Signed)
[facility] EMERGENCY DEPARTMENT Provider Note   CSN: [id number] Arrival date & time: 01/29/20  1608     History Chief Complaint  Patient presents with  . Hip Pain    Fernando Hess is a 74 y.o. male.  The history is provided by the patient. No language interpreter was used.  Hip Pain This is a new problem.     74 year old male with history of diabetes, hypertension, CAD, nephrolithiasis, CKD, presenting complaining of hip pain.  Patient report prior to arrival he was standing when he experiencing acute onset of pain to his left hip.  He also felt a pop, nearly fell, and since then having difficulty ambulating due to the pain.  Pain is nonradiating, localized in his left hip.  Pain is moderate in severity without any associated numbness or weakness.  No complaints of chest pain, abdominal pain, back pain.  Denies any recent injury.  No dysuria or hematuria.  Past Medical History:  Diagnosis Date  . Atrial premature complexes   . CAD (coronary artery disease), native coronary artery cardiologist-- dr Raliegh Ip. nelson   hx NSTEMI 09-24-2015  s/p  CABG x5 on 10-01-2017;  post op STEMI inferolateral wall,  SVG OM1 and SVG OM2 occluded, distal OM occlusion the calpruit, treated medically  . CKD (chronic kidney disease), stage III   . Erectile dysfunction   . Esophageal reflux   . History of atrial fibrillation    post op CABG 10-02-2015  . History of kidney stones   . History of non-ST elevation myocardial infarction (NSTEMI) 09/24/2015   s/p  CABG x5  . History of ST elevation myocardial infarction (STEMI) 10/22/2015   inferior wall,  post op CABG 10-02-2015  . Hyperlipidemia   . Hypertension   . Kidney stone    h/o  . Left ureteral stone   . Mild atherosclerosis of both carotid arteries   . Nephrolithiasis    per CT bilateral non-obstructive calculi  . Peripheral artery disease    LE Arterial US 01/2019: R PTA and ATA occluded; L ATA occluded  . RBBB (right  bundle branch block)   . Renal atrophy, right   . Sleep apnea    wears cpap   . ST elevation myocardial infarction (STEMI) of inferior wall ([place]) 10/22/2015  . Type 2 diabetes mellitus treated with insulin ([place])    followed by pcp  . Wears glasses     Patient Active Problem List   Diagnosis Date Noted  . 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 Acute on chronic diastolic CHF (congestive heart failure) ([place]) 11/18/2019  . OSA on CPAP 03/07/2019  . Peripheral artery disease   . CKD (chronic kidney disease) stage 4, GFR 15-29 ml/min ([name]) 11/29/2018  . Hypersomnia with sleep apnea 11/29/2018  . Snoring 11/29/2018  . Intractable vascular headache 11/11/2018  . CAD (coronary artery disease) of artery bypass graft   . 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 Elevated troponin   . CAD (coronary artery disease), native coronary artery 09/24/2015  . Acute renal failure (ARF) ([place]) 09/24/2015  . 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 ([place]) 03/01/2008  . Hyperlipidemia, unspecified   . Essential hypertension   . History of kidney stones     Past Surgical History:  Procedure Laterality Date  . APPENDECTOMY  1965  . CARDIAC CATHETERIZATION  N/A 09/26/2015   Procedure: Left Heart Cath and Coronary Angiography;  Surgeon: Troy Sine, MD;  Location: [facility];  Service: Cardiovascular;  Laterality: N/A;  . CARDIAC CATHETERIZATION N/A 10/22/2015   Procedure: Left Heart Cath and Coronary Angiography;  Surgeon: Sherren Mocha, MD;  Location: [facility];  Service: Cardiovascular;  Laterality: N/A;  . CATARACT EXTRACTION W/ INTRAOCULAR LENS  IMPLANT, BILATERAL  2017  . COLONOSCOPY    . CORONARY ARTERY BYPASS GRAFT N/A 10/02/2015   Procedure: CORONARY ARTERY BYPASS GRAFTING (CABG) X5 LIMA-LAD; SVG-DIAG; SVG-OM; SVG-PD;  SVG-RAMUS TRANSESOPHAGEAL ECHOCARDIOGRAM (TEE) ENDOSCOPIC GREATER SAPHENOUS VEIN HARVEST BILAT LE;  Surgeon: Ivin Poot, MD;  Location: [facility];  Service: Open Heart Surgery;  Laterality: N/A;  . CYSTOSCOPY/URETEROSCOPY/HOLMIUM LASER/STENT PLACEMENT Left 08/10/2018   Procedure: CYSTOSCOPY/URETEROSCOPY/HOLMIUM LASER/STENT PLACEMENT;  Surgeon: Festus Aloe, MD;  Location: [facility];  Service: Urology;  Laterality: Left;  . CYSTOSCOPY/URETEROSCOPY/HOLMIUM LASER/STENT PLACEMENT Left 09/10/2018   Procedure: CYSTOSCOPY/URETEROSCOPY/HOLMIUM LASER/STENT EXCHANGE;  Surgeon: Festus Aloe, MD;  Location: [facility];  Service: Urology;  Laterality: Left;  . LEFT HEART CATHETERIZATION WITH CORONARY ANGIOGRAM N/A 04/13/2014   Procedure: LEFT HEART CATHETERIZATION WITH CORONARY ANGIOGRAM;  Surgeon: Jacolyn Reedy, MD;  Location: [facility] CATH LAB;  Service: Cardiovascular;  Laterality: N/A;  . LEG SURGERY Right age 86   closed reduction leg fracture  . POLYPECTOMY    . TEE WITHOUT CARDIOVERSION N/A 10/02/2015   Procedure: TRANSESOPHAGEAL ECHOCARDIOGRAM (TEE);  Surgeon: Ivin Poot, MD;  Location: [facility];  Service: Open Heart Surgery;  Laterality: N/A;  . URETEROSCOPY WITH HOLMIUM LASER LITHOTRIPSY Bilateral 2004;  2005  dr grapey  @[email or web address]   . VASECTOMY         Family History  Problem Relation Age of Onset  . Heart attack Mother   . Heart attack Father   . Diabetes Brother   . Pancreatic cancer Brother   . Diabetes Brother   . Colon cancer Neg Hx   . Esophageal cancer Neg Hx   . Prostate cancer Neg Hx   . Rectal cancer Neg Hx   . Stomach cancer Neg Hx   . Colon polyps Neg Hx     Social History   Tobacco Use  . Smoking status: Never Smoker  . Smokeless tobacco: Never Used  Substance Use Topics  . Alcohol use: Not Currently  . Drug use: Never    Home Medications Prior to Admission medications   Medication Sig Start Date End Date Taking? Authorizing Provider    amoxicillin (AMOXIL) 875 MG tablet Take 875 mg by mouth 2 (two) times daily. 11/29/19   [provider]  aspirin 81 MG chewable tablet Chew 1 tablet (81 mg total) by mouth daily. 10/25/15   Jacolyn Reedy, MD  atorvastatin (LIPITOR) 40 MG tablet Take 40 mg by mouth every morning.     [provider]  B-D UF III MINI PEN NEEDLES 31G X 5 MM MISC USE AS DIRECTED WITH LANTUS SOLOSTAR 04/02/19   [provider]  busPIRone (BUSPAR) 10 MG tablet Take 1 tablet (10 mg total) by mouth at bedtime. 01/25/20   Lomax, Amy, NP  DULoxetine (CYMBALTA) 30 MG capsule Take 30 mg by mouth at bedtime.    [provider]  DULoxetine (CYMBALTA) 60 MG capsule Take 60 mg by mouth daily.  04/12/18   [provider]  furosemide (LASIX) 20 MG tablet Take 1 tablet (20 mg total) by mouth daily. 11/21/19  Theora Gianotti, NP  glimepiride (AMARYL) 4 MG tablet Take 4 mg by mouth 2 (two) times daily.     [provider]  Insulin Glargine (BASAGLAR KWIKPEN) 100 UNIT/ML SOPN Inject 31 Units into the skin daily.     [provider]  Melatonin 3 MG TABS Take 1 tablet by mouth daily as needed.    [provider]  metoprolol succinate (TOPROL-XL) 25 MG 24 hr tablet TAKE 1/2 TABLET BY MOUTH EVERY DAY 03/17/19   Dorothy Spark, MD  Multiple Vitamin (MULTIVITAMIN) tablet Take 1 tablet by mouth daily.    [provider]  nitroGLYCERIN (NITROSTAT) 0.4 MG SL tablet Place 0.4 mg under the tongue every 5 (five) minutes as needed for chest pain.    [provider]  ondansetron (ZOFRAN-ODT) 4 MG disintegrating tablet Take 4 mg by mouth every 8 (eight) hours as needed for nausea or vomiting.     [provider]  [facility] ULTRA test strip FOR USE OF [place] BLOOD ONCE DAILY 03/18/19   [provider]  pantoprazole (PROTONIX) 40 MG tablet TAKE 1 TABLET BY MOUTH EVERY DAY 11/25/19   [name], MD  pioglitazone (ACTOS) 45 MG tablet  Take 45 mg by mouth daily.    [provider]    Allergies    Levaquin [levofloxacin], Lisinopril, and Victoza [liraglutide]  Review of Systems   Review of Systems  All other systems reviewed and are negative.   Physical Exam Updated Vital Signs BP (!) 121/101 (BP Location: Right Arm)   Pulse (!) 52   Temp 99 F (37.2 C) (Oral)   Resp 14   SpO2 100%   Physical Exam Vitals and nursing note reviewed.  Constitutional:      General: He is not in acute distress.    Appearance: He is well-developed.     Comments: Appears uncomfortable but nontoxic-appearing  HENT:     Head: Atraumatic.  Eyes:     Conjunctiva/sclera: Conjunctivae normal.  Cardiovascular:     Rate and Rhythm: Normal rate and regular rhythm.     Pulses: Normal pulses.     Heart sounds: Normal heart sounds.  Pulmonary:     Effort: Pulmonary effort is normal.     Breath sounds: Normal breath sounds.  Abdominal:     Palpations: Abdomen is soft.     Tenderness: There is no abdominal tenderness.  Musculoskeletal:        General: Tenderness (Left hip: Tenderness to left inguinal region on palpation.  Decreased active range of motion but intact passive range of motion.  No deformity noted.  No overlying skin changes.) present.     Cervical back: Neck supple.     Comments: No midline spine tenderness.  No CVA tenderness.  Skin:    Capillary Refill: Capillary refill takes less than 2 seconds.     Findings: No rash.  Neurological:     Mental Status: He is alert and oriented to person, place, and time.  Psychiatric:        Mood and Affect: Mood normal.     ED Results / Procedures / Treatments   Labs (all labs ordered are listed, but only abnormal results are displayed) Labs Reviewed  CBC WITH DIFFERENTIAL/PLATELET - Abnormal; Notable for the following components:      Result Value   Monocytes Absolute 1.2 (*)    All other components within normal limits  URINALYSIS, ROUTINE W REFLEX MICROSCOPIC -  Abnormal; Notable for the following  components:   Protein, ur 100 (*)    All other components within normal limits  CBG MONITORING, ED - Abnormal; Notable for the following components:   Glucose-Capillary 59 (*)    All other components within normal limits  I-STAT CHEM 8, ED    EKG None  Radiology CT Hip Left Wo Contrast  Result Date: 01/29/2020 CLINICAL DATA:  Hip pain, question of fracture EXAM: CT OF THE LEFT HIP WITHOUT CONTRAST TECHNIQUE: Multidetector CT imaging of the left hip was performed according to the standard protocol. Multiplanar CT image reconstructions were also generated. COMPARISON:  Radiograph same day FINDINGS: Bones/Joint/Cartilage No fracture or dislocation. There is diffuse osteopenia. Moderate left hip osteoarthritis is seen with superior joint space loss and subchondral sclerosis. No large joint effusion is seen. Tiny enthesophytes seen at the posterior ischial tuberosity and greater trochanter. Ligaments Suboptimally assessed by CT. Muscles and Tendons The muscles surrounding the hip are normal appearance without focal atrophy or tear. Visualized portions of the tendons appear to be intact. Soft tissues Mild overlying soft tissue swelling seen around the lateral aspect of the hip. Scattered colonic diverticula are seen. There are dense vascular calcifications seen. IMPRESSION: No definite fracture or dislocation. Diffuse osteopenia Moderate left hip osteoarthritis. Electronically Signed   By: Prudencio Pair M.D.   On: 01/29/2020 20:18   DG Hip Unilat With Pelvis 2-3 Views Left  Result Date: 01/29/2020 CLINICAL DATA:  Left hip pain since yesterday with no known injury. EXAM: DG HIP (WITH OR WITHOUT PELVIS) 2-3V LEFT COMPARISON:  01/03/2018 FINDINGS: No fracture or bone lesion. Hip joints, SI joints and symphysis pubis are normally spaced and aligned. No significant degenerative/arthropathic change. Soft tissues are unremarkable. IMPRESSION: Negative. Electronically Signed    By: Lajean Manes M.D.   On: 01/29/2020 17:11    Procedures Procedures (including critical care time)  Medications Ordered in ED Medications  lidocaine (LIDODERM) 5 % 1 patch (1 patch Transdermal Patch Applied 01/29/20 2114)  morphine 4 MG/ML injection 4 mg (4 mg Intravenous Given 01/29/20 2114)  ondansetron (ZOFRAN) injection 4 mg (4 mg Intravenous Given 01/29/20 2114)    ED Course  I have reviewed the triage vital signs and the nursing notes.  Pertinent labs & imaging results that were available during my care of the patient were reviewed by me and considered in my medical decision making (see chart for details).    MDM Rules/Calculators/A&P                       Final Clinical Impression(s) / ED Diagnoses Final diagnoses:  Left hip pain    Rx / DC Orders ED Discharge Orders         Ordered    ibuprofen (ADVIL) 600 MG tablet  Every 6 hours PRN     01/29/20 2316    cyclobenzaprine (FLEXERIL) 10 MG tablet  2 times daily PRN     01/29/20 2316         Patient report with acute onset of left hip pain without any injury.  Was having difficulty ambulating initially.  Initial concern was fracture of the hip due to his discomfort.  Initial x-ray of the hip without any acute finding.  A follow-up CT scan of the hip show no definitive fracture or dislocation.  Moderate left hip osteoarthritis were noted.  Diffuse osteopenia.  Patient was given pain medication.  Afterward he felt much better and able to ambulate without assist.  Low suspicion for  kidney stone, dissection, abdominal pathology, or other acute emergent medical condition.  CBG of 59, crackers were given.  Patient able to eat without difficulty.  11:14 PM Patient now felt much better.  Able to ambulate without assistant.  He is stable for discharge.  Will discharge home with anti-inflammatory  and muscle relaxant.  UA obtained showed no evidence of blood in urine to suggest kidney stone.  Return precaution discussed.   Suspect musculoskeletal pain.   Domenic Moras, [name] 01/29/20 2317    Quintella Reichert, MD 01/30/20 [phone]

## 2020-02-15 ENCOUNTER — Encounter (INDEPENDENT_AMBULATORY_CARE_PROVIDER_SITE_OTHER): Payer: Medicare Other | Admitting: Ophthalmology

## 2020-02-22 ENCOUNTER — Encounter (INDEPENDENT_AMBULATORY_CARE_PROVIDER_SITE_OTHER): Payer: Self-pay | Admitting: Ophthalmology

## 2020-02-22 ENCOUNTER — Other Ambulatory Visit: Payer: Self-pay

## 2020-02-22 ENCOUNTER — Ambulatory Visit (INDEPENDENT_AMBULATORY_CARE_PROVIDER_SITE_OTHER): Payer: Medicare Other | Admitting: Ophthalmology

## 2020-02-22 DIAGNOSIS — H35011 Changes in retinal vascular appearance, right eye: Secondary | ICD-10-CM

## 2020-02-22 DIAGNOSIS — D3131 Benign neoplasm of right choroid: Secondary | ICD-10-CM

## 2020-02-22 DIAGNOSIS — G4733 Obstructive sleep apnea (adult) (pediatric): Secondary | ICD-10-CM

## 2020-02-22 DIAGNOSIS — Z9989 Dependence on other enabling machines and devices: Secondary | ICD-10-CM

## 2020-02-22 DIAGNOSIS — E113311 Type 2 diabetes mellitus with moderate nonproliferative diabetic retinopathy with macular edema, right eye: Secondary | ICD-10-CM | POA: Diagnosis not present

## 2020-02-22 MED ORDER — FLUORESCEIN SODIUM 10 % IV SOLN
500.0000 mg | INTRAVENOUS | Status: AC | PRN
  Administered 2020-02-22: 500 mg via INTRAVENOUS

## 2020-02-22 NOTE — Assessment & Plan Note (Signed)
At my prompting the patient has initiated use of CPAP, and now enjoys the many benefits including by his report, feels better, naps less, better sense of wellbeing, and in fact improved enjoyment of life, happiness

## 2020-02-22 NOTE — Assessment & Plan Note (Signed)
Small choroidal nevus, no vascular lakes, no signs of leakages on fluorescein angiography no change in size

## 2020-02-22 NOTE — Progress Notes (Signed)
02/22/2020     CHIEF COMPLAINT Patient presents for Retina Follow Up   HISTORY OF PRESENT ILLNESS: Fernando Hess is a 74 y.o. male who presents to the clinic today for:   HPI    Retina Follow Up    Patient presents with  Diabetic Retinopathy.  In both eyes.  Duration of 9 weeks.  Since onset it is stable.          Comments    9 week follow up - OCT OU, FFA/FP OU Patient denies change in vision and overall has no complaints.        Last edited by Gerda Diss on 02/22/2020  8:09 AM. (History)      Referring physician: [name], MD [place],  [place] [address]  HISTORICAL INFORMATION:   Selected notes from the MEDICAL RECORD NUMBER    Lab Results  Component Value Date   HGBA1C 8.7 (H) 11/19/2019     CURRENT MEDICATIONS: No current outpatient medications on file. (Ophthalmic Drugs)   No current facility-administered medications for this visit. (Ophthalmic Drugs)   Current Outpatient Medications (Other)  Medication Sig  . aspirin 81 MG chewable tablet Chew 1 tablet (81 mg total) by mouth daily.  [name] atorvastatin (LIPITOR) 40 MG tablet Take 40 mg by mouth daily.   . B-D UF III MINI PEN NEEDLES 31G X 5 MM MISC USE AS DIRECTED WITH LANTUS SOLOSTAR  . busPIRone (BUSPAR) 10 MG tablet Take 1 tablet (10 mg total) by mouth at bedtime.  . cyclobenzaprine (FLEXERIL) 10 MG tablet Take 1 tablet (10 mg total) by mouth 2 (two) times daily as needed for muscle spasms.  . DULoxetine (CYMBALTA) 30 MG capsule Take 30 mg by mouth at bedtime.  . DULoxetine (CYMBALTA) 60 MG capsule Take 60 mg by mouth daily.   . furosemide (LASIX) 20 MG tablet Take 1 tablet (20 mg total) by mouth daily.  [name] ibuprofen (ADVIL) 600 MG tablet Take 1 tablet (600 mg total) by mouth every 6 (six) hours as needed.  . Insulin Glargine (BASAGLAR KWIKPEN) 100 UNIT/ML SOPN Inject 31 Units into the skin daily.   . Melatonin 3 MG TABS Take 1 tablet by mouth daily as needed (for sleep).   .  metoprolol succinate (TOPROL-XL) 25 MG 24 hr tablet TAKE 1/2 TABLET BY MOUTH EVERY DAY (Patient taking differently: Take 12.5 mg by mouth daily. )  . Multiple Vitamin (MULTIVITAMIN) tablet Take 1 tablet by mouth daily.  . nitroGLYCERIN (NITROSTAT) 0.4 MG SL tablet Place 0.4 mg under the tongue every 5 (five) minutes as needed for chest pain.  Glory Rosebush ULTRA test strip FOR USE OF CHEACKING BLOOD ONCE DAILY  . pantoprazole (PROTONIX) 40 MG tablet TAKE 1 TABLET BY MOUTH EVERY DAY (Patient taking differently: Take 40 mg by mouth daily. )  . pioglitazone (ACTOS) 45 MG tablet Take 45 mg by mouth daily.  . valsartan (DIOVAN) 160 MG tablet Take 160 mg by mouth daily.   No current facility-administered medications for this visit. (Other)      REVIEW OF SYSTEMS:    ALLERGIES Allergies  Allergen Reactions  . Levaquin [Levofloxacin] Itching and Rash  . Lisinopril Itching and Rash  . Victoza [Liraglutide]     Severe fatigue & insomnia    PAST MEDICAL HISTORY Past Medical History:  Diagnosis Date  . Atrial premature complexes   . CAD (coronary artery disease), native coronary artery cardiologist-- dr Raliegh Ip. nelson   hx NSTEMI  09-24-2015  s/p  CABG x5 on 10-01-2017;  post op STEMI inferolateral wall,  SVG OM1 and SVG OM2 occluded, distal OM occlusion the calpruit, treated medically  . CKD (chronic kidney disease), stage III   . Erectile dysfunction   . Esophageal reflux   . History of atrial fibrillation    post op CABG 10-02-2015  . History of kidney stones   . History of non-ST elevation myocardial infarction (NSTEMI) 09/24/2015   s/p  CABG x5  . History of ST elevation myocardial infarction (STEMI) 10/22/2015   inferior wall,  post op CABG 10-02-2015  . Hyperlipidemia   . Hypertension   . Kidney stone    h/o  . Left ureteral stone   . Mild atherosclerosis of both carotid arteries   . Nephrolithiasis    per CT bilateral non-obstructive calculi  . Peripheral artery disease    LE  Arterial US 01/2019: R PTA and ATA occluded; L ATA occluded  . RBBB (right bundle branch block)   . Renal atrophy, right   . Sleep apnea    wears cpap   . ST elevation myocardial infarction (STEMI) of inferior wall ([place]) 10/22/2015  . Type 2 diabetes mellitus treated with insulin ([place])    followed by pcp  . Wears glasses    Past Surgical History:  Procedure Laterality Date  . APPENDECTOMY  1965  . CARDIAC CATHETERIZATION N/A 09/26/2015   Procedure: Left Heart Cath and Coronary Angiography;  Surgeon: Troy Sine, MD;  Location: [facility];  Service: Cardiovascular;  Laterality: N/A;  . CARDIAC CATHETERIZATION N/A 10/22/2015   Procedure: Left Heart Cath and Coronary Angiography;  Surgeon: Sherren Mocha, MD;  Location: [facility];  Service: Cardiovascular;  Laterality: N/A;  . CATARACT EXTRACTION W/ INTRAOCULAR LENS  IMPLANT, BILATERAL  2017  . COLONOSCOPY    . CORONARY ARTERY BYPASS GRAFT N/A 10/02/2015   Procedure: CORONARY ARTERY BYPASS GRAFTING (CABG) X5 LIMA-LAD; SVG-DIAG; SVG-OM; SVG-PD; SVG-RAMUS TRANSESOPHAGEAL ECHOCARDIOGRAM (TEE) ENDOSCOPIC GREATER SAPHENOUS VEIN HARVEST BILAT LE;  Surgeon: Ivin Poot, MD;  Location: [facility];  Service: Open Heart Surgery;  Laterality: N/A;  . CYSTOSCOPY/URETEROSCOPY/HOLMIUM LASER/STENT PLACEMENT Left 08/10/2018   Procedure: CYSTOSCOPY/URETEROSCOPY/HOLMIUM LASER/STENT PLACEMENT;  Surgeon: Festus Aloe, MD;  Location: [facility];  Service: Urology;  Laterality: Left;  . CYSTOSCOPY/URETEROSCOPY/HOLMIUM LASER/STENT PLACEMENT Left 09/10/2018   Procedure: CYSTOSCOPY/URETEROSCOPY/HOLMIUM LASER/STENT EXCHANGE;  Surgeon: Festus Aloe, MD;  Location: [facility];  Service: Urology;  Laterality: Left;  . LEFT HEART CATHETERIZATION WITH CORONARY ANGIOGRAM N/A 04/13/2014   Procedure: LEFT HEART CATHETERIZATION WITH CORONARY ANGIOGRAM;  Surgeon: Jacolyn Reedy, MD;  [facility];  Service: Cardiovascular;   Laterality: N/A;  . LEG SURGERY Right age 72   closed reduction leg fracture  . POLYPECTOMY    . TEE WITHOUT CARDIOVERSION N/A 10/02/2015   Procedure: TRANSESOPHAGEAL ECHOCARDIOGRAM (TEE);  Surgeon: Ivin Poot, MD;  Location: [facility];  Service: Open Heart Surgery;  Laterality: N/A;  . URETEROSCOPY WITH HOLMIUM LASER LITHOTRIPSY Bilateral 2004;  2005  dr grapey  @[facility]   . VASECTOMY      FAMILY HISTORY Family History  Problem Relation Age of Onset  . Heart attack Mother   . Heart attack Father   . Diabetes Brother   . Pancreatic cancer Brother   . Diabetes Brother   . Colon cancer Neg Hx   . Esophageal cancer Neg Hx   . Prostate cancer Neg Hx   . Rectal cancer Neg  Hx   . Stomach cancer Neg Hx   . Colon polyps Neg Hx     SOCIAL HISTORY Social History   Tobacco Use  . Smoking status: Never Smoker  . Smokeless tobacco: Never Used  Substance Use Topics  . Alcohol use: Not Currently  . Drug use: Never         OPHTHALMIC EXAM:  Base Eye Exam    Visual Acuity (Snellen - Linear)      Right Left   Dist cc 20/40-1 20/30-1   Dist ph cc 20/30-1 NI   Correction: Glasses       Tonometry (Tonopen, 8:15 AM)      Right Left   Pressure 18 21       Pupils      Pupils Dark Light Shape React APD   Right PERRL 5 3 Round Brisk None   Left PERRL 5 3 Round Brisk None       Visual Fields (Counting fingers)      Left Right    Full Full       Extraocular Movement      Right Left    Full Full       Neuro/Psych    Oriented x3: Yes   Mood/Affect: Normal       Dilation    Both eyes: 2.5% Phenylephrine, 1.0% Mydriacyl @ 8:16 AM        Slit Lamp and Fundus Exam    External Exam      Right Left   External Normal Normal       Slit Lamp Exam      Right Left   Lids/Lashes Normal Normal   Conjunctiva/Sclera White and quiet White and quiet   Cornea Clear Clear   Anterior Chamber Deep and quiet Deep and quiet   Iris Round and reactive Round and reactive   Lens  Posterior chamber intraocular lens Posterior chamber intraocular lens   Anterior Vitreous Normal Normal       Fundus Exam      Right Left   Posterior Vitreous Normal Normal   Disc Normal Normal   C/D Ratio 0.5 0.5   Macula Macular thickening, Microaneurysms Macular thickening, Microaneurysms   Vessels NPDR- Moderate NPDR- Moderate   Periphery Choroidal nevus, small in macula,, stable, 2 dd size, flat Normal          IMAGING AND PROCEDURES  Imaging and Procedures for 02/22/20  OCT, Retina - OU - Both Eyes       Right Eye Quality was good. Scan locations included subfoveal. Progression has improved. Findings include normal observations.   Left Eye Quality was good. Scan locations included subfoveal. Progression has improved. Findings include normal observations.        Color Fundus Photography Optos - OU - Both Eyes       Right Eye Progression has been stable. Disc findings include normal observations. Macula : microaneurysms.   Notes OD with moderate nonproliferative diabetic retinopathy, resolved CSME.  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  Moreover, choroidal nevus small in posterior pole, flat, no change in size from photodocumentation since 2019       Fluorescein Angiography Optos (Transit OD)       Injection:  500 mg Fluorescein Sodium 10 % injection   NDC: 0065-0092-65   Route: Intravenous, Site: Left ArmRight Eye   Progression has improved. Early phase findings include  delayed filling, microaneurysm. Mid/Late phase findings include microaneurysm. Choroidal neovascularization is not present.   Left Eye   Progression has improved. Early phase findings include microaneurysm. Choroidal neovascularization is not present.   Notes OD with microaneurysms perifoveal without clinically significant macular edema.  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  Posterior choroidal nevus without tumor lakes and without vascular leakage  OS, moderate nonproliferative diabetic retinopathy and no signs of clinically significant macular edema                ASSESSMENT/PLAN:  OSA on CPAP At my prompting the patient has initiated use of CPAP, and now enjoys the many benefits including by his report, feels better, naps less, better sense of wellbeing, and in fact improved enjoyment of life, happiness  Choroidal nevus of right eye Small choroidal nevus, no vascular lakes, no signs of leakages on fluorescein angiography no change in size      ICD-10-CM   1. Retinal macroaneurysm of right eye  H35.011 Color Fundus Photography Optos - OU - Both Eyes    Fluorescein Angiography Optos (Transit OD)    Fluorescein Sodium 10 % injection 500 mg  2. OSA on CPAP  G47.33    Z99.89   3. Choroidal nevus of right eye  D31.31 Color Fundus Photography Optos - OU - Both Eyes  4. 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  2.  3.  Ophthalmic Meds Ordered this visit:  Meds ordered this encounter  Medications  . Fluorescein Sodium 10 % injection 500 mg       Return in about 4 months (around 06/23/2020) for DILATE OU, OCT, COLOR FP.  There are no Patient Instructions on file for this visit.   Explained the diagnoses, plan, and follow up with the patient and they expressed understanding.  Patient expressed understanding of the importance of proper follow up care.   Clent Demark Chason Mciver M.D. Diseases & Surgery of the Retina and Vitreous Retina &  Diabetic [place] 02/22/20     Abbreviations: M myopia (nearsighted); A astigmatism; H hyperopia (farsighted); P presbyopia; Mrx spectacle prescription;  CTL contact lenses; OD right eye; OS left eye; OU both eyes  XT exotropia; ET esotropia; PEK punctate epithelial keratitis; PEE punctate epithelial erosions; DES dry eye syndrome; MGD meibomian gland dysfunction; ATs artificial tears; PFAT's preservative free artificial tears; [place] nuclear sclerotic cataract; PSC posterior subcapsular cataract; ERM epi-retinal membrane; PVD posterior vitreous detachment; RD retinal detachment; DM diabetes mellitus; DR diabetic retinopathy; NPDR non-proliferative diabetic retinopathy; PDR proliferative diabetic retinopathy; CSME clinically significant macular edema; DME diabetic macular edema; dbh dot blot hemorrhages; CWS cotton wool spot; POAG primary open angle glaucoma; C/D cup-to-disc ratio; HVF humphrey visual field; GVF goldmann visual field; OCT optical coherence tomography; IOP intraocular pressure; BRVO Branch retinal vein occlusion; CRVO central retinal vein occlusion; CRAO central retinal artery occlusion; BRAO branch retinal artery occlusion; RT retinal tear; SB scleral buckle; PPV pars plana vitrectomy; VH Vitreous hemorrhage; PRP panretinal laser photocoagulation; IVK intravitreal kenalog; VMT vitreomacular traction; MH Macular hole;  NVD neovascularization of the disc; NVE  neovascularization elsewhere; AREDS age related eye disease study; ARMD age related macular degeneration; POAG primary open angle glaucoma; EBMD epithelial/anterior basement membrane dystrophy; ACIOL anterior chamber intraocular lens; IOL intraocular lens; PCIOL posterior chamber intraocular lens; Phaco/IOL phacoemulsification with intraocular lens placement; [place] photorefractive keratectomy; LASIK laser assisted in situ keratomileusis; HTN hypertension; DM diabetes mellitus; COPD chronic obstructive pulmonary disease

## 2020-03-07 ENCOUNTER — Encounter: Payer: Self-pay | Admitting: Family Medicine

## 2020-03-07 ENCOUNTER — Ambulatory Visit (INDEPENDENT_AMBULATORY_CARE_PROVIDER_SITE_OTHER): Payer: Medicare Other | Admitting: Family Medicine

## 2020-03-07 ENCOUNTER — Other Ambulatory Visit: Payer: Self-pay

## 2020-03-07 VITALS — BP 109/67 | HR 70 | Ht 66.0 in | Wt 214.0 lb

## 2020-03-07 DIAGNOSIS — G4733 Obstructive sleep apnea (adult) (pediatric): Secondary | ICD-10-CM

## 2020-03-07 DIAGNOSIS — Z9989 Dependence on other enabling machines and devices: Secondary | ICD-10-CM

## 2020-03-07 NOTE — Progress Notes (Signed)
PATIENT: Fernando Hess DOB: 10-Mar-1946  REASON FOR VISIT: follow up HISTORY FROM: patient  Chief Complaint  Patient presents with  . Follow-up    Rm 1 here for a osa f/u.     HISTORY OF PRESENT ILLNESS: Today 03/07/20 Fernando Hess is a 74 y.o. male here today for follow up for OSA on CPAP. He reports that he is doing better. We added BuSpar 10 mg at bedtime at his last visit in March. He reports that he also takes Melatonin and trazodone. He has been able to tolerate CPAP much better. He continues to exercise. He is walking about a mile per day. He continues to follow-up closely with cardiology. He does report that he is not feeling like he needs to take naps any longer. He has not noted any significant improvement in energy. He does report becoming more accustomed to CPAP therapy and wishes to continue.  Compliance report dated 02/06/2020 through 03/06/2020 reveals that he used CPAP 27 of the past 30 days for compliance of 90%. He used CPAP greater than 4 hours 25 of the past 30 days for compliance of 83%. Average usage was 7 hours and 11 minutes. Residual AHI was 5.5 on 5 to 16 cm water and an EPR of three. There were no significant leaks noted.  HISTORY: (copied from my note on 11/23/2019)  Fernando Hess is a 74 y.o. male here today for follow up for OSA on CPAP.  He admits that he continues to struggle with compliance.  He has placed CPAP therapy nearly every night but continues to have difficulty with meeting 4-hour compliance.  His wife presents with him today and aids in history.  She states that he often will take his mask off stating " this thing is going to kill me".  He admits to some anxiety with using CPAP therapy.  He has tried multiple masks with similar outcomes.  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  He feels that this has created more anxiety with using CPAP.  He feels that he  is tolerating pressure settings well.  He denies any other concerns with CPAP therapy.  Compliance report dated 10/23/2019 through 11/21/2019 reveals that he used CPAP 27 of the last 30 days for compliance of 90%.  He used CPAP greater than 4 hours 10 of the last 30 days for compliance of 33%.  Average usage on days used was 3 hours and 13 minutes.  Residual AHI was 3.0 on 5 to 16 cm of water and an EPR of 3.  There was no significant leak noted.  He was recently hospitalized for two days with acute on chronic CHF.  He was discharged on 11/20/2019.  He reports that he is feeling much better.  Shortness of breath has resolved.  He has follow-up this week with his primary care provider.  He did walk for exercise yesterday for about 30 minutes.   HISTORY: (copied from my note on 05/25/2019)  Fernando Hess a 74 y.o.malehere today for follow up of OSA on CPAP. AHI was 8 at last follow up in 02/2019.He reports he is doing very well from a compliance perspective. He does note benefit of using CPAP therapy. He continues to have headaches. Headaches are described as a tension or pressure in the top of his head. These headaches have improved somewhat since starting CPAP therapy. He states that  he has taken Tylenol at least once daily every day. He does not drink much water at all. He has not monitored his blood pressures at home. He denies allergy symptoms. He denies any vision changes, nausea, vomiting, light or sound sensitivity.  Compliance report dated 04/24/2019 through 05/23/2019 reveals that he is using CPAP 30 out of the last 30 days for compliance of 100%. 27 of those days he used CPAP greater than 4 hours for compliance of 90%. Average usage was 5 hours and 28 minutes. AHI is now 6.2 on 5 to 16 cm of water and an EPR of 3. There was no significant leak. Average pressure 12.5. Maximum pressure 14.2.   HISTORY: (copied frommynote on 03/07/2019)  Fernando Hess a 74 y.o.malehere  today for follow up of OSA on CPAP. He does note less headaches since starting CPAP. He may have 1 headache a month now. He is tolerating therapy well and without concerns. Baseline AHI 37.9/h and supine 58.5/h.  02/05/2019 - 03/06/2019 20 Usage days 29/30 days (97%) >= 4 hours 26 days (87%) < 4 hours 3 days (10%) Usage hours 172 hours 43 minutes Average usage (total days) 5 hours 45 minutes Average usage (days used) 5 hours 57 minutes Median usage (days used) 5 hours 50 minutes Total used hours (value since last reset - 03/06/2019) 352 hours AirSense 10 AutoSet Serial number [id number] Mode AutoSet Min Pressure 5 cmH2O Max Pressure 15 cmH2O EPR Fulltime EPR level 3 Response Soft Therapy Pressure - cmH2O Median: 8.1 95th percentile: 12.8 Maximum: 14.1 Leaks - L/min Median: 2.4 95th percentile: 5.5 Maximum: 23.8 Events per hour AI: 6.8 HI: 2.0 AHI: 8.8 Apnea Index Central: 1.9 Obstructive: 4.8 Unknown: 0.1 RERA Index 0.5 Cheyne-Stokes respiration (average duration per night) 13 minutes (4%)   History (copied from Dr Edwena Felty note on 11/11/2018)  OYD:XAJOI W Hess a 74 y.o.malepatient, seen here as in a referral from Dr.Ahernforsleep apnea evaluation in relation to sleep.   He reports headaches any time of the day, and his ophthalmologist Dr Deloria Lair, MD suggested the PSG test. He suspected hypoxemia contributing to abnormal vascular congestion. Patient has been physically inactive since Bypass surgery.    Sleep habits are as follows:the couple sleeps in separate rooms, she snores, he doesn't think he does. She says he does.  5 PM is supper time, and bedtime is 10 PM, but he sometimes is in be as early as 7. The patient usually watches TV during the evening hours. He describes his bedroom as not entirely cool, but quiet and dark. He mainly sleeps prone, without any pillows. Sleep duration varies, his sleep is mainly fragmented by nocturia. He  estimates that he can go to the bathroom every 2 hours. The patient reports that he wakes up because he has to go to the bathroom. He has a dry mouth when he wakes up. He has difficulties going back to sleep.  Sometimes he goes back to the den and watches tv for hours and has a snack.  Racing thoughts.  He rises at 8.30 AM - goes back to bed after breakfast. Same after lunch- naps last 2-3 hours (!). He sleeps to escape his headaches.  Estimated sleep time up to 10 hours.   Sleep medical history: never had a sleep study. Headaches. CKD grade 4 . 2 surgeries for renal stones 2019. DM 2 - 1999, now insulin for the last 6 month. diabetic retinopathy, nephropathy, bradycardia.   Family sleep history:2 sons with OSA.  Social history:married, retired, 2 adult sons. Non smoker, seldomly ETOH,  Caffeine : he drinks coke, 1 Coffee/ day and chocolate, seldom iced tea - when going out.   REVIEW OF SYSTEMS: Out of a complete 14 system review of symptoms, the patient complains only of the following symptoms, fatigue, shortness of breath and all other reviewed systems are negative.  ESS: 15  ALLERGIES: Allergies  Allergen Reactions  . Levaquin [Levofloxacin] Itching and Rash  . Lisinopril Itching and Rash  . Victoza [Liraglutide]     Severe fatigue & insomnia    HOME MEDICATIONS: Outpatient Medications Prior to Visit  Medication Sig Dispense Refill  . aspirin 81 MG chewable tablet Chew 1 tablet (81 mg total) by mouth daily.    [name] atorvastatin (LIPITOR) 40 MG tablet Take 40 mg by mouth daily.     . B-D UF III MINI PEN NEEDLES 31G [place] 5 MM MISC USE AS DIRECTED WITH LANTUS SOLOSTAR    . busPIRone (BUSPAR) 10 MG tablet Take 1 tablet (10 mg total) by mouth at bedtime. 90 tablet 2  . cyclobenzaprine (FLEXERIL) 10 MG tablet Take 1 tablet (10 mg total) by mouth 2 (two) times daily as needed for muscle spasms. 20 tablet 0  . DULoxetine (CYMBALTA) 30 MG capsule Take 30 mg by mouth at  bedtime.    . DULoxetine (CYMBALTA) 60 MG capsule Take 60 mg by mouth daily.   4  . furosemide (LASIX) 20 MG tablet Take 1 tablet (20 mg total) by mouth daily. 30 tablet 6  . ibuprofen (ADVIL) 600 MG tablet Take 1 tablet (600 mg total) by mouth every 6 (six) hours as needed. 30 tablet 0  . Insulin Glargine (BASAGLAR KWIKPEN) 100 UNIT/ML SOPN Inject 31 Units into the skin daily.     . Melatonin 3 MG TABS Take 1 tablet by mouth daily as needed (for sleep).     . metoprolol succinate (TOPROL-XL) 25 MG 24 hr tablet TAKE 1/2 TABLET BY MOUTH EVERY DAY (Patient taking differently: Take 12.5 mg by mouth daily. ) 45 tablet 1  . Multiple Vitamin (MULTIVITAMIN) tablet Take 1 tablet by mouth daily.    . nitroGLYCERIN (NITROSTAT) 0.4 MG SL tablet Place 0.4 mg under the tongue every 5 (five) minutes as needed for chest pain.    Glory Rosebush ULTRA test strip FOR USE OF CHEACKING BLOOD ONCE DAILY    . pantoprazole (PROTONIX) 40 MG tablet TAKE 1 TABLET BY MOUTH EVERY DAY (Patient taking differently: Take 40 mg by mouth daily. ) 90 tablet 2  . pioglitazone (ACTOS) 45 MG tablet Take 45 mg by mouth daily.    . valsartan (DIOVAN) 160 MG tablet Take 160 mg by mouth daily.     No facility-administered medications prior to visit.    PAST MEDICAL HISTORY: Past Medical History:  Diagnosis Date  . Acute renal failure (ARF) ([place]) 09/24/2015  . Atrial premature complexes   . CAD (coronary artery disease) of artery bypass graft    Early occlusion of saphenous vein graft to intermediate and marginal branch in February 2007 following bypass grafting   . CAD (coronary artery disease), native coronary artery cardiologist-- dr Raliegh Ip. nelson   hx NSTEMI 09-24-2015  s/p  CABG x5 on 10-01-2017;  post op STEMI inferolateral wall,  SVG OM1 and SVG OM2 occluded, distal OM occlusion the calpruit, treated medically  . CKD (chronic kidney disease), stage III   . Elevated troponin   . Erectile dysfunction   . Esophageal reflux   .  History  of atrial fibrillation    post op CABG 10-02-2015  . History of kidney stones   . History of kidney stones   . History of non-ST elevation myocardial infarction (NSTEMI) 09/24/2015   s/p  CABG x5  . History of ST elevation myocardial infarction (STEMI) 10/22/2015   inferior wall,  post op CABG 10-02-2015  . Hyperlipidemia   . Hypertension   . Kidney stone    h/o  . Left ureteral stone   . Mild atherosclerosis of both carotid arteries   . Nephrolithiasis    per CT bilateral non-obstructive calculi  . Peripheral artery disease    LE Arterial US 01/2019: R PTA and ATA occluded; L ATA occluded  . RBBB (right bundle branch block)   . Renal atrophy, right   . Sleep apnea    wears cpap   . ST elevation myocardial infarction (STEMI) of inferior wall ([place]) 10/22/2015  . Type 2 diabetes mellitus treated with insulin ([place])    followed by pcp  . 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 Wears glasses     PAST SURGICAL HISTORY: Past Surgical History:  Procedure Laterality Date  . APPENDECTOMY  1965  . CARDIAC CATHETERIZATION N/A 09/26/2015   Procedure: Left Heart Cath and Coronary Angiography;  Surgeon: Troy Sine, MD;  Location: [facility];  Service: Cardiovascular;  Laterality: N/A;  . CARDIAC CATHETERIZATION N/A 10/22/2015   Procedure: Left Heart Cath and Coronary Angiography;  Surgeon: Sherren Mocha, MD;  Location: [facility];  Service: Cardiovascular;  Laterality: N/A;  . CATARACT EXTRACTION W/ INTRAOCULAR LENS  IMPLANT, BILATERAL  2017  . COLONOSCOPY    . CORONARY ARTERY BYPASS GRAFT N/A 10/02/2015   Procedure: CORONARY ARTERY BYPASS GRAFTING (CABG) X5 LIMA-LAD; SVG-DIAG; SVG-OM; SVG-PD; SVG-RAMUS TRANSESOPHAGEAL ECHOCARDIOGRAM (TEE) ENDOSCOPIC GREATER SAPHENOUS VEIN HARVEST BILAT LE;  Surgeon: Ivin Poot, MD;  Location: [facility];  Service: Open Heart Surgery;  Laterality: N/A;    . CYSTOSCOPY/URETEROSCOPY/HOLMIUM LASER/STENT PLACEMENT Left 08/10/2018   Procedure: CYSTOSCOPY/URETEROSCOPY/HOLMIUM LASER/STENT PLACEMENT;  Surgeon: Festus Aloe, MD;  Location[facility];  Service: Urology;  Laterality: Left;  . CYSTOSCOPY/URETEROSCOPY/HOLMIUM LASER/STENT PLACEMENT Left 09/10/2018   Procedure: CYSTOSCOPY/URETEROSCOPY/HOLMIUM LASER/STENT EXCHANGE;  Surgeon: Festus Aloe, MD;  Location: [facility];  Service: Urology;  Laterality: Left;  . LEFT HEART CATHETERIZATION WITH CORONARY ANGIOGRAM N/A 04/13/2014   Procedure: LEFT HEART CATHETERIZATION WITH CORONARY ANGIOGRAM;  Surgeon: Jacolyn Reedy, MD;  Location: [facility];  Service: Cardiovascular;  Laterality: N/A;  . LEG SURGERY Right age 65   closed reduction leg fracture  . POLYPECTOMY    . TEE WITHOUT CARDIOVERSION N/A 10/02/2015   Procedure: TRANSESOPHAGEAL ECHOCARDIOGRAM (TEE);  Surgeon: Ivin Poot, MD;  Location: [facility];  Service: Open Heart Surgery;  Laterality: N/A;  . URETEROSCOPY WITH HOLMIUM LASER LITHOTRIPSY Bilateral 2004;  2005  dr grapey  @[email or web address]   . VASECTOMY      FAMILY HISTORY: Family History  Problem Relation Age of Onset  . Heart attack Mother   . Heart attack Father   . Diabetes Brother   . Pancreatic cancer Brother   . Diabetes Brother   . Colon cancer Neg Hx   . Esophageal cancer Neg Hx   . Prostate cancer Neg Hx   . Rectal cancer Neg Hx   . Stomach cancer Neg Hx   . Colon polyps Neg Hx  SOCIAL HISTORY: Social History   Socioeconomic History  . Marital status: Married    Spouse name: Not on file  . Number of children: Not on file  . Years of education: Not on file  . Highest education level: Not on file  Occupational History  . Occupation: retired  Tobacco Use  . Smoking status: Never Smoker  . Smokeless tobacco: Never Used  Vaping Use  . Vaping Use: Never used  Substance and Sexual Activity  . Alcohol use: Not Currently  . Drug use: Never  . Sexual  activity: Not on file  Other Topics Concern  . 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 Difficulty of Paying Living Expenses:   Food Insecurity:   . Worried About Charity fundraiser in the Last Year:   . Arboriculturist in the Last Year:   Transportation Needs:   . Film/video editor (Medical):   [name] Lack of Transportation (Non-Medical):   Physical Activity:   . Days of Exercise per Week:   . Minutes of Exercise per Session:   Stress:   . Feeling of Stress :   Social Connections:   . Frequency of Communication with Friends and Family:   . Frequency of Social Gatherings with Friends and Family:   . Attends Religious Services:   . Active Member of Clubs or Organizations:   . Attends Archivist Meetings:   [name] Marital Status:   Intimate Partner Violence:   . Fear of Current or Ex-Partner:   . Emotionally Abused:   [name] Physically Abused:   . Sexually Abused:       PHYSICAL EXAM  There were no vitals filed for this visit. There is no height or weight on file to calculate BMI.  Generalized: Well developed, in no acute distress  Cardiology: normal rate and rhythm, no murmur noted Respiratory: clear to auscultation bilaterally  Neurological examination  Mentation: Alert oriented to time, place, history taking. Follows all commands speech and language fluent Cranial nerve II-XII: Pupils were equal round reactive to light. Extraocular movements were full, visual field were full  Motor: The motor testing reveals 5 over 5 strength of all 4 extremities. Good symmetric motor tone is noted throughout.  Gait and station: Gait is normal.    DIAGNOSTIC DATA (LABS, IMAGING, TESTING) - I reviewed patient records, labs, notes, testing and imaging myself where available.  No flowsheet data found.   Lab Results  Component Value Date   WBC 9.6  01/29/2020   HGB 13.7 01/29/2020   HCT 42.7 01/29/2020   MCV 94.3 01/29/2020   PLT 202 01/29/2020      Component Value Date/Time   NA 140 12/02/2019 0950   K 4.0 12/02/2019 0950   CL 103 12/02/2019 0950   CO2 24 12/02/2019 0950   GLUCOSE 111 (H) 12/02/2019 0950   GLUCOSE 263 (H) 11/23/2019 2127   BUN 19 12/02/2019 0950   CREATININE 1.92 (H) 12/02/2019 0950   CALCIUM 9.0 12/02/2019 0950   PROT 6.4 (L) 08/07/2018 1950   ALBUMIN 3.9 08/07/2018 1950   AST 27 08/07/2018 1950   ALT 30 08/07/2018 1950   ALKPHOS 59 08/07/2018 1950   BILITOT 1.3 (H) 08/07/2018 1950   GFRNONAA 33 (L) 12/02/2019 0950   GFRAA 39 (L) 12/02/2019 0950   Lab  Results  Component Value Date   CHOL 81 10/23/2015   HDL 30 (L) 10/23/2015   LDLCALC 31 10/23/2015   TRIG 102 10/23/2015   CHOLHDL 2.7 10/23/2015   Lab Results  Component Value Date   HGBA1C 8.7 (H) 11/19/2019   Lab Results  Component Value Date   VITAMINB12 602 10/04/2009   Lab Results  Component Value Date   TSH 0.468 10/22/2015       ASSESSMENT AND PLAN 74 y.o. year old male  has a past medical history of Acute renal failure (ARF) ([place]) (09/24/2015), Atrial premature complexes, CAD (coronary artery disease) of artery bypass graft, CAD (coronary artery disease), native coronary artery (cardiologist-- dr Liane Comber), CKD (chronic kidney disease), stage III, Elevated troponin, Erectile dysfunction, Esophageal reflux, History of atrial fibrillation, History of kidney stones, History of kidney stones, History of non-ST elevation myocardial infarction (NSTEMI) (09/24/2015), History of ST elevation myocardial infarction (STEMI) (10/22/2015), Hyperlipidemia, Hypertension, Kidney stone, Left ureteral stone, Mild atherosclerosis of both carotid arteries, Nephrolithiasis, Peripheral artery disease, RBBB (right bundle branch block), Renal atrophy, right, Sleep apnea, ST elevation myocardial infarction (STEMI) of inferior wall ([place]) (10/22/2015), Type 2  diabetes mellitus treated with insulin ([place]), 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 here with     ICD-10-CM   1. OSA on CPAP  G47.33    Z99.89     [place] feels that with the addition of BuSpar, he has become more comfortable using CPAP therapy. Compliance report now reveals acceptable compliance. I have commended him on his efforts. We will continue BuSpar 10 mg daily at bedtime. He was encouraged to continue using CPAP nightly and for greater than 4 hours each night. He is to continue daily exercise and close follow-up with primary care and cardiology. Healthy lifestyle habits encouraged. He will follow-up with me in 6 months, sooner if needed. He verbalizes understanding and agreement with this plan.   No orders of the defined types were placed in this encounter.    No orders of the defined types were placed in this encounter.     I spent 15 minutes with the patient. 50% of this time was spent counseling and educating patient on plan of care and medications.    Debbora Presto, FNP-C 03/07/2020, 7:59 AM [facility] [address], [place], [place] [address] [phone]

## 2020-03-07 NOTE — Patient Instructions (Addendum)
I am so very proud of you!!! Keep up the good work.   Please continue using your CPAP regularly. While your insurance requires that you use CPAP at least 4 hours each night on 70% of the nights, I recommend, that you not skip any nights and use it throughout the night if you can. Getting used to CPAP and staying with the treatment long term does take time and patience and discipline. Untreated obstructive sleep apnea when it is moderate to severe can have an adverse impact on cardiovascular health and raise her risk for heart disease, arrhythmias, hypertension, congestive heart failure, stroke and diabetes. Untreated obstructive sleep apnea causes sleep disruption, nonrestorative sleep, and sleep deprivation. This can have an impact on your day to day functioning and cause daytime sleepiness and impairment of cognitive function, memory loss, mood disturbance, and problems focussing. Using CPAP regularly can improve these symptoms.  Follow up in 6 months   Heart Disease Prevention Heart disease is the leading cause of death in the world. Coronary artery disease is the most common cause of heart disease. This condition results when cholesterol and other substances (plaque) build up inside the walls of the blood vessels that supply your heart muscle (arteries). This buildup in arteries is called atherosclerosis. You can take actions to lower your risk of heart disease. How can heart disease affect me? Heart disease can cause many unpleasant symptoms and complications, such as:  Chest pain (angina).  Reduced or blocked blood flow to your heart. This can cause: ? Irregular heartbeats (arrhythmias). ? Heart attack. ? Heart failure. What can increase my risk? The following factors may make you more likely to develop this condition:  High blood pressure (hypertension).  High cholesterol.  Smoking.  A diet high in saturated fats or trans fats.  Lack of physical  activity.  Obesity.  Drinking too much alcohol.  Diabetes.  Having a family history of heart disease. What actions can I take to prevent heart disease? Nutrition   Eat a heart-healthy eating plan as told by your health care provider. Examples include the DASH (Dietary Approaches to Stop Hypertension) eating plan or the Mediterranean diet.  Generally, it is recommended that you: ? Eat less salt (sodium). Ask your health care provider how much sodium is safe for you. Most people should have less than 2,300 mg each day. ? Limit unhealthy fats, such as saturated and trans fats, in your diet. You can do this by eating low-fat dairy products, eating less red meat, and avoiding processed foods. ? Eat healthy fats (omega-3 fatty acids). These are found in fish, such as mackerel or salmon. ? Eat more fruits and vegetables. You should try to fill one-half of your plate with fruits and vegetables at each meal. ? Eat more whole grains. ? Avoid foods and drinks that have added sugars. Lifestyle   Get regular exercise. This is one of the most important things you can do for your health. Generally, it is recommended that you: ? Exercise for at least 30 minutes on most days of the week (150 minutes each week). The exercise should increase your heart rate and make you sweat (aerobic exercise). ? Add strength exercises on at least 2 days each week.  Do not use any products that contain nicotine or tobacco, such as cigarettes and e-cigarettes. These can damage your heart and blood vessels. If you need help quitting, ask your health care provider. Alcohol use  Do not drink alcohol if: ? Your health  care provider tells you not to drink. ? You are pregnant, may be pregnant, or are planning to become pregnant.  If you drink alcohol, limit how much you have: ? 0-1 drink a day for women. ? 0-2 drinks a day for men.  Be aware of how much alcohol is in your drink. In the [place] one drink equals one  typical bottle of beer (12 oz), one-half glass of wine (5 oz), or one shot of hard liquor (1 oz). Medicines  Take over-the-counter and prescription medicines only as told by your health care provider.  Ask your health care provider whether you should take an aspirin every day. Taking aspirin may help reduce your risk of heart disease and stroke.  Depending on your risk factors, your health care provider may prescribe medicines to lower your risk of heart disease or to control related conditions. You may take medicine to: ? Lower cholesterol. ? Control blood pressure. ? Control diabetes. General information  Keep your blood pressure under control, as recommended by your health care provider. For most healthy people, the upper number of your blood pressure (systolic) should be no higher than 120, and the lower number (diastolic) no higher than 80. Treatment may be needed if your blood pressure is higher than 130/80.  Have your blood pressure checked at least every two years. Your health care provider may check your blood pressure more often if you have high blood pressure.  After age 87, have your cholesterol checked every 4-6 years. If you have risk factors for heart disease, you may need to have it checked more frequently. Treatment may be needed if your cholesterol is high.  Have your body mass index (BMI) checked every year. Your health care provider can calculate your BMI from your height and weight.  Work with your health care provider to lose weight, if needed, or to maintain a healthy weight. Where to find more information:  Centers for Disease Control and Prevention: TextNotebook.fi  American Heart Association: www.heart.org ? Take a free online heart disease risk quiz to better understand your personal risk factors. Summary  Heart disease is the leading cause of death in the world.  Heart disease can cause chest pain, abnormal heart rhythms, heart attack, and heart  failure.  High blood pressure, high cholesterol, and smoking are the main risk factors for heart disease, although other factors also contribute.  You can take actions to lower your chances of developing heart disease. Work with your health care provider to reduce your risk by following a heart-healthy diet, being physically active, and controlling your weight, blood pressure, and cholesterol level. This information is not intended to replace advice given to you by your health care provider. Make sure you discuss any questions you have with your health care provider. Document Revised: 09/16/2017 Document Reviewed: 09/16/2017 Elsevier Patient Education  [place].    CPAP and BPAP Information CPAP and BPAP are methods of helping a person breathe with the use of air pressure. CPAP stands for "continuous positive airway pressure." BPAP stands for "bi-level positive airway pressure." In both methods, air is blown through your nose or mouth and into your air passages to help you breathe well. CPAP and BPAP use different amounts of pressure to blow air. With CPAP, the amount of pressure stays the same while you breathe in and out. With BPAP, the amount of pressure is increased when you breathe in (inhale) so that you can take larger breaths. Your health care provider will  recommend whether CPAP or BPAP would be more helpful for you. Why are CPAP and BPAP treatments used? CPAP or BPAP can be helpful if you have:  Sleep apnea.  Chronic obstructive pulmonary disease (COPD).  Heart failure.  Medical conditions that weaken the muscles of the chest including muscular dystrophy, or neurological diseases such as amyotrophic lateral sclerosis (ALS).  Other problems that cause breathing to be weak, abnormal, or difficult. CPAP is most commonly used for obstructive sleep apnea (OSA) to keep the airways from collapsing when the muscles relax during sleep. How is CPAP or BPAP administered? Both  CPAP and BPAP are provided by a small machine with a flexible plastic tube that attaches to a plastic mask. You wear the mask. Air is blown through the mask into your nose or mouth. The amount of pressure that is used to blow the air can be adjusted on the machine. Your health care provider will determine the pressure setting that should be used based on your individual needs. When should CPAP or BPAP be used? In most cases, the mask only needs to be worn during sleep. Generally, the mask needs to be worn throughout the night and during any daytime naps. People with certain medical conditions may also need to wear the mask at other times when they are awake. Follow instructions from your health care provider about when to use the machine. What are some tips for using the mask?   Because the mask needs to be snug, some people feel trapped or closed-in (claustrophobic) when first using the mask. If you feel this way, you may need to get used to the mask. One way to do this is by holding the mask loosely over your nose or mouth and then gradually applying the mask more snugly. You can also gradually increase the amount of time that you use the mask.  Masks are available in various types and sizes. Some fit over your mouth and nose while others fit over just your nose. If your mask does not fit well, talk with your health care provider about getting a different one.  If you are using a mask that fits over your nose and you tend to breathe through your mouth, a chin strap may be applied to help keep your mouth closed.  The CPAP and BPAP machines have alarms that may sound if the mask comes off or develops a leak.  If you have trouble with the mask, it is very important that you talk with your health care provider about finding a way to make the mask easier to tolerate. Do not stop using the mask. Stopping the use of the mask could have a negative impact on your health. What are some tips for using the  machine?  Place your CPAP or BPAP machine on a secure table or stand near an electrical outlet.  Know where the on/off switch is located on the machine.  Follow instructions from your health care provider about how to set the pressure on your machine and when you should use it.  Do not eat or drink while the CPAP or BPAP machine is on. Food or fluids could get pushed into your lungs by the pressure of the CPAP or BPAP.  Do not smoke. Tobacco smoke residue can damage the machine.  For home use, CPAP and BPAP machines can be rented or purchased through home health care companies. Many different brands of machines are available. Renting a machine before purchasing may help you find  out which particular machine works well for you.  Keep the CPAP or BPAP machine and attachments clean. Ask your health care provider for specific instructions. Get help right away if:  You have redness or open areas around your nose or mouth where the mask fits.  You have trouble using the CPAP or BPAP machine.  You cannot tolerate wearing the CPAP or BPAP mask.  You have pain, discomfort, and bloating in your abdomen. Summary  CPAP and BPAP are methods of helping a person breathe with the use of air pressure.  Both CPAP and BPAP are provided by a small machine with a flexible plastic tube that attaches to a plastic mask.  If you have trouble with the mask, it is very important that you talk with your health care provider about finding a way to make the mask easier to tolerate. This information is not intended to replace advice given to you by your health care provider. Make sure you discuss any questions you have with your health care provider. Document Revised: 12/22/2018 Document Reviewed: 07/21/2016 Elsevier Patient Education  [place].

## 2020-03-08 NOTE — Progress Notes (Signed)
Cardiology Office Note:    Date:  03/09/2020   ID:  Fernando Hess, DOB 05-Aug-1946, MRN [id number]  PCP:  Fernando Cruel, MD  Cardiologist:  Fernando Dawley, MD  Electrophysiologist:  None  Sleep:Fernando. Dohmeier Nephrologist:Fernando. Justin Hess  Referring MD: Fernando Cruel, MD   Chief Complaint:  Follow-up (CAD, CHF), Shortness of Breath, and Loss of Consciousness    Patient Profile:    Fernando Hess is a 74 y.o. male with:   Coronary artery disease ? S/p MI in 09/2015 >> s/p CABG ? S/p inferolateral STEMI 2/17 >> S-OM1 and S-OM2 occluded; dOM occluded >> med Rx ? Myoview 10/2018: no isch, EF 52, apical thinning   Chronic diastolic CHF  admx in 03/8241  Diabetes mellitus  Hypertension  Hyperlipidemia  Chronic kidney disease stage IV  Obstructive sleep apnea  Peripheral arterial disease ? eval by Fernando. Gwenlyn Hess 7.2020; bilat tibial disease >> Med Rx (Pletal) >> DC'd with CHF  Prior CV studies: Echocardiogram 11/19/2019 EF 60-65, moderate LVH, GR 1 DD, no RWMA, mild AI, mild MR, mildly dilated ascending aorta (39 mm)  LE arterial US 02/11/2019 Right PTA and ATA occluded; left ATA occluded  Myoview 10/21/2018 Low risk, mildly abnormal stress nuclear study with apical thinning but no ischemia; EF 52 with mild global hypokinesis  Holter 06/11/18 Very frequent supraventricular ectopy, contributing to 15% of overall beats. Very frequent couplets and a very short runs of atrial tachycardia.  Echocardiogram 10/23/2015 EF 55-60, normal wall motion, mildly calcified aortic valve leaflets, mild AI, mild to moderate MR  Cardiac catheterization 10/22/2015 LM ostial 50 LAD mid 90 RI ostial 90 LCx proximal 80; OM1 80; OM2 100 (culprit for MI) RCA mid 50 LIMA-LAD patent SVG-RPDA patent SVG-D1 patent SVG-OM1 100 SVG-OM2100 Medical therapy  Pre-CABG Dopplers 09/28/2015 Summary: Findings suggest 1-39% internal carotid artery stenosis bilaterally. Vertebral arteries are  patent with antegrade flow. Bilateral ABIs are within normal limits.  History of Present Illness:    Fernando Hess was seen by me in March 2021 after an admission to the hospital with diastolic CHF.  He was seen back in the office 2 weeks later by Fernando Kail, [name] with complaints of shortness of breath and 3 Hess weight gain.  Overall, his volume status appeared stable.  No further changes were made.  He returns for follow-up.  He is here today with his wife.  He continues to note shortness of breath with exertion.  This does not seem to be getting any worse.  However, it is troublesome for him.  He has not had orthopnea, PND.  He has not had significant lower extremity swelling.  He has not had chest discomfort.  He did have an episode of syncope earlier this week.  He has a long history of orthostatic intolerance.  He had just gotten up to walk into the bedroom and became dizzy.  He sat down several times beforehand.  On his last attempt, he fell back on the bed and passed out.  He tried to get up again a couple of other times and became near syncopal.  His wife caught him on 1 occasion.  He has not had a recurrence.  Past Medical History:  Diagnosis Date  . Acute renal failure (ARF) ([place]) 09/24/2015  . Atrial premature complexes   . CAD (coronary artery disease) of artery bypass graft    Early occlusion of saphenous vein graft to intermediate and marginal branch in February 2007 following bypass grafting   .  CAD (coronary artery disease), native coronary artery cardiologist-- Fernando Hess   hx NSTEMI 09-24-2015  s/p  CABG x5 on 10-01-2017;  post op STEMI inferolateral wall,  SVG OM1 and SVG OM2 occluded, distal OM occlusion the calpruit, treated medically  . CKD (chronic kidney disease), stage III   . Elevated troponin   . Erectile dysfunction   . Esophageal reflux   . History of atrial fibrillation    post op CABG 10-02-2015  . History of kidney stones   . History of kidney stones     . History of non-ST elevation myocardial infarction (NSTEMI) 09/24/2015   s/p  CABG x5  . History of ST elevation myocardial infarction (STEMI) 10/22/2015   inferior wall,  post op CABG 10-02-2015  . Hyperlipidemia   . Hypertension   . Kidney stone    h/o  . Left ureteral stone   . Mild atherosclerosis of both carotid arteries   . Nephrolithiasis    per CT bilateral non-obstructive calculi  . Peripheral artery disease    LE Arterial US 01/2019: R PTA and ATA occluded; L ATA occluded  . RBBB (right bundle branch block)   . Renal atrophy, right   . Sleep apnea    wears cpap   . ST elevation myocardial infarction (STEMI) of inferior wall ([place]) 10/22/2015  . Type 2 diabetes mellitus treated with insulin ([place])    followed by pcp  . 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 Wears glasses     Current Medications: Current Meds  Medication Sig  . aspirin 81 MG chewable tablet Chew 1 tablet (81 mg total) by mouth daily.  [name] atorvastatin (LIPITOR) 40 MG tablet Take 40 mg by mouth daily.   . B-D UF III MINI PEN NEEDLES 31G X 5 MM MISC USE AS DIRECTED WITH LANTUS SOLOSTAR  . busPIRone (BUSPAR) 10 MG tablet Take 1 tablet (10 mg total) by mouth at bedtime.  . DULoxetine (CYMBALTA) 30 MG capsule Take 30 mg by mouth at bedtime.  . DULoxetine (CYMBALTA) 60 MG capsule Take 60 mg by mouth daily.   . furosemide (LASIX) 20 MG tablet Take 1 tablet (20 mg total) by mouth daily.  . Insulin Glargine (BASAGLAR KWIKPEN) 100 UNIT/ML SOPN Inject 31 Units into the skin daily.   [name] MAGNESIUM-ZINC PO Take 1 tablet by mouth daily.  . Melatonin 3 MG TABS Take 1 tablet by mouth daily as needed (for sleep).   . metoprolol succinate (TOPROL-XL) 25 MG 24 hr tablet TAKE 1/2 TABLET BY MOUTH EVERY DAY  . Multiple Vitamin (MULTIVITAMIN) tablet Take 1 tablet by mouth daily.  . nitroGLYCERIN (NITROSTAT) 0.4 MG SL tablet Place 0.4 mg  under the tongue every 5 (five) minutes as needed for chest pain.  Glory Rosebush ULTRA test strip FOR USE OF CHEACKING BLOOD ONCE DAILY  . pantoprazole (PROTONIX) 40 MG tablet TAKE 1 TABLET BY MOUTH EVERY DAY  . pioglitazone (ACTOS) 45 MG tablet Take 45 mg by mouth daily.     Allergies:   Levaquin [levofloxacin], Lisinopril, and Victoza [liraglutide]   Social History   Tobacco Use  . Smoking status: Never Smoker  . Smokeless tobacco: Never Used  Vaping Use  . Vaping Use: Never used  Substance Use Topics  . Alcohol use: Not Currently  . Drug use: Never     Family Hx: The patient's family history includes Diabetes in his brother and brother;  Heart attack in his father and mother; Pancreatic cancer in his brother. There is no history of Colon cancer, Esophageal cancer, Prostate cancer, Rectal cancer, Stomach cancer, or Colon polyps.  ROS   EKGs/Labs/Other Test Reviewed:    EKG:  EKG is   ordered today.  The ekg ordered today demonstrates sinus rhythm, HR 62, normal axis, right bundle branch block, frequent PACs, QTC 452, similar to prior tracings  Recent Labs: 11/24/2019: B Natriuretic Peptide 84.7 12/02/2019: BUN 19; Creatinine, Ser 1.92; Potassium 4.0; Sodium 140 01/29/2020: Hemoglobin 13.7; Platelets 202   Recent Lipid Panel Lab Results  Component Value Date/Time   CHOL 81 10/23/2015 05:15 AM   TRIG 102 10/23/2015 05:15 AM   HDL 30 (L) 10/23/2015 05:15 AM   CHOLHDL 2.7 10/23/2015 05:15 AM   LDLCALC 31 10/23/2015 05:15 AM    Physical Exam:    VS:  BP (!) 98/50   Pulse 76   Ht 5\' 6"  (1.676 m)   Wt 216 lb 6.4 oz (98.2 kg)   SpO2 95%   BMI 34.93 kg/m     Wt Readings from Last 3 Encounters:  03/09/20 216 lb 6.4 oz (98.2 kg)  03/07/20 214 lb (97.1 kg)  12/14/19 206 lb (93.4 kg)     Constitutional:      Appearance: Healthy appearance. Not in distress.  Neck:     Thyroid: No thyromegaly.     Vascular: JVD normal.  Pulmonary:     Effort: Pulmonary effort is normal.      Breath sounds: No wheezing. Rales (? faint L base) present.  Cardiovascular:     Normal rate. Regular rhythm. Normal S1. Normal S2.     Murmurs: There is a grade 1/6 early systolic murmur at the URSB.  Edema:    Peripheral edema absent.  Abdominal:     Palpations: Abdomen is soft. There is no hepatomegaly.  Skin:    General: Skin is warm and dry.  Neurological:     General: No focal deficit present.     Mental Status: Alert and oriented to person, place and time.     Cranial Nerves: Cranial nerves are intact.      ASSESSMENT & PLAN:    1. Coronary artery disease involving coronary bypass graft of native heart without angina pectoris 2. Shortness of breath 3. Lung field abnormal finding on examination He continues to note shortness of breath with exertion.  He has not had chest discomfort.  He has a history of bypass surgery in 2017.  He had a myocardial infarction about a month later with demonstration of an occluded vein graft to the OM1/OM2 and distal OM occlusion.  He has been managed medically.  Myoview in February 2020 was low risk.  As he is diabetic, question if his shortness of breath may be an anginal equivalent.  His volume status appears stable.  He does have low blood pressure.  His medications will be adjusted as below.  He does note significant issues with his back which has limited his mobility.  I suspect his shortness of breath is multifactorial and related to coronary artery disease, congestive heart failure, and deconditioning.  However, I think it is important that we rule out the possibility of ischemia.  He also had questionable crackles in his left base.  He has not really had any infectious symptoms.  I will obtain a chest x-ray given his lung exam.  I will also obtain blood work today to include a BMET, CBC and  BNP.  -Chest x-ray  -Lexiscan Myoview  -BMET, CBC, BNP  -Follow-up with Fernando. Meda Coffee in 3 months, sooner if Myoview abnormal  4. Vasovagal  syncope Echocardiogram in March 2021 with normal EF.  He has a long history of orthostatic intolerance.  He had no evidence of brady-arrhythmia on EKG today.  His symptoms are consistent with orthostasis.  He really does not take much for blood pressure.  Given his prior PACs, we will try to continue his low-dose beta-blocker.  I have asked him to reduce his furosemide to 3 times a week for now.  We discussed the importance of daily weights in regards to his heart failure.  I have asked him to obtain compression socks, use caution with getting up as well as to pump his calf muscles before standing.  Obtain labs as outlined above (BMET, CBC, BNP).  5. Chronic diastolic CHF (congestive heart failure) ([name]) Overall, volume status appears stable.  NYHA II-IIb.  Adjust furosemide as outlined above due to low blood pressure.  We discussed the importance of daily weights and when to take extra furosemide.  6. CKD (chronic kidney disease) stage 4, GFR 15-29 ml/min ([name]) He continues to follow-up with nephrology.  If his Myoview is significantly abnormal, we will need to consider cardiac catheterization.  However, he will be at high risk for contrast-induced nephropathy.  Most recent creatinine 1.92.  Obtain BMET today.  7. Essential hypertension As noted, blood pressure has been running low.  Adjust furosemide.  8. Diabetes mellitus I have asked him to follow-up with primary care to discuss possibly coming off of pioglitazone in light of his heart failure.   Dispo:  Return in about 3 months (around 06/09/2020) for Routine Follow Up w/ Fernando. Meda Coffee, in person.   Medication Adjustments/Labs and Tests Ordered: Current medicines are reviewed at length with the patient today.  Concerns regarding medicines are outlined above.  Tests Ordered: Orders Placed This Encounter  Procedures  . DG Chest 2 View  . Basic metabolic panel  . CBC  . Pro b natriuretic peptide (BNP)  . MYOCARDIAL PERFUSION IMAGING  . EKG  12-Lead   Medication Changes: No orders of the defined types were placed in this encounter.   Signed, [name], [name]  03/09/2020 1:01 PM    [facility] [address], [place], [place]  [address] Phone: [phone]; Fax: [phone]

## 2020-03-09 ENCOUNTER — Other Ambulatory Visit: Payer: Self-pay

## 2020-03-09 ENCOUNTER — Ambulatory Visit (INDEPENDENT_AMBULATORY_CARE_PROVIDER_SITE_OTHER): Payer: Medicare Other | Admitting: Physician Assistant

## 2020-03-09 ENCOUNTER — Ambulatory Visit
Admission: RE | Admit: 2020-03-09 | Discharge: 2020-03-09 | Disposition: A | Discharge status: Home / Self Care | Payer: Medicare Other | Source: Ambulatory Visit | Attending: Physician Assistant | Admitting: Physician Assistant

## 2020-03-09 ENCOUNTER — Ambulatory Visit: Payer: Medicare Other | Admitting: Physician Assistant

## 2020-03-09 ENCOUNTER — Telehealth: Payer: Self-pay

## 2020-03-09 ENCOUNTER — Encounter: Payer: Self-pay | Admitting: Physician Assistant

## 2020-03-09 VITALS — BP 98/50 | HR 76 | Ht 66.0 in | Wt 216.4 lb

## 2020-03-09 DIAGNOSIS — E113392 Type 2 diabetes mellitus with moderate nonproliferative diabetic retinopathy without macular edema, left eye: Secondary | ICD-10-CM

## 2020-03-09 DIAGNOSIS — Z794 Long term (current) use of insulin: Secondary | ICD-10-CM

## 2020-03-09 DIAGNOSIS — R918 Other nonspecific abnormal finding of lung field: Secondary | ICD-10-CM | POA: Diagnosis not present

## 2020-03-09 DIAGNOSIS — N184 Chronic kidney disease, stage 4 (severe): Secondary | ICD-10-CM | POA: Diagnosis not present

## 2020-03-09 DIAGNOSIS — R55 Syncope and collapse: Secondary | ICD-10-CM | POA: Diagnosis not present

## 2020-03-09 DIAGNOSIS — R0602 Shortness of breath: Secondary | ICD-10-CM

## 2020-03-09 DIAGNOSIS — I10 Essential (primary) hypertension: Secondary | ICD-10-CM | POA: Diagnosis not present

## 2020-03-09 DIAGNOSIS — I5032 Chronic diastolic (congestive) heart failure: Secondary | ICD-10-CM

## 2020-03-09 DIAGNOSIS — I25810 Atherosclerosis of coronary artery bypass graft(s) without angina pectoris: Secondary | ICD-10-CM | POA: Diagnosis not present

## 2020-03-09 NOTE — Telephone Encounter (Signed)
-----   Message from Liliane Shi, [place] sent at 03/09/2020  4:45 PM EDT ----- Creatinine increased. Potassium elevated. PLAN: -Hold Lasix for three days -Then resume at dose discussed at [place] today (every Mon, Wed, Fri only).  -Take extra Lasix if weight increases by more than 3 lbs in 1 day -BMET 1 week  CBC and BNP still pending  [name], [name] 16:44 03/09/2020

## 2020-03-09 NOTE — Telephone Encounter (Signed)
Returned call to Pt.  Advised of all medication orders.  Scheduled BMP for 7/2  Pt indicates understanding.

## 2020-03-09 NOTE — Patient Instructions (Addendum)
Medication Instructions:   Your physician has recommended you make the following change in your medication:   1) Decrease Lasix to Monday/Wednesday/Friday  *If you need a refill on your cardiac medications before your next appointment, please call your pharmacy*  Lab Work:  You will have labs drawn today: BMET/BNP/CBC  If you have labs (blood work) drawn today and your tests are completely normal, you will receive your results only by: [name] MyChart Message (if you have MyChart) OR . A paper copy in the mail If you have any lab test that is abnormal or we need to change your treatment, we will call you to review the results.  Testing/Procedures:  Your physician has requested that you have a lexiscan myoview. For further information please visit [email or web address]. Please follow instruction sheet, as given.  Chest X-ray Instructions:    1. You may have this done at the [facility], located in the [place] on the 1st floor.    2. You do no have to have an appointment.    3. [place], [place] [address]        [phone]        Monday - Friday  8:00 am - 5:00 pm  Follow-Up: At [facility], you and your health needs are our priority.  As part of our continuing mission to provide you with exceptional heart care, we have created designated Provider Care Teams.  These Care Teams include your primary Cardiologist (physician) and Advanced Practice Providers (APPs -  Physician Assistants and Nurse Practitioners) who all work together to provide you with the care you need, when you need it.  We recommend signing up for the patient portal called "MyChart".  Sign up information is provided on this After Visit Summary.  MyChart is used to connect with patients for Virtual Visits (Telemedicine).  Patients are able to view lab/test results, encounter notes, upcoming appointments, etc.  Non-urgent messages can be sent to your provider as well.    To learn more about what you can do with MyChart, go to [email or web address].    Your next appointment:   3 month(s)  The format for your next appointment:   In Person  Provider:   Ena Dawley, MD  Other Instructions  1) Discuss with your PCP about stopping Actos (Pioglitazone) due to heart failure 2) Wear compression socks 3) Pump calves before standing 4) Weigh yourself every day, try to do weigh yourself at the same time each day with similar clothing so weights will be accurate. If your weight increases by more than 3 pounds or more in 1 day, take an extra Lasix.

## 2020-03-10 LAB — CBC
Hematocrit: 40.3 % (ref 37.5–51.0)
Hemoglobin: 13.3 g/dL (ref 13.0–17.7)
MCH: 30 pg (ref 26.6–33.0)
MCHC: 33 g/dL (ref 31.5–35.7)
MCV: 91 fL (ref 79–97)
Platelets: 223 10*3/uL (ref 150–450)
RBC: 4.43 x10E6/uL (ref 4.14–5.80)
RDW: 13.2 % (ref 11.6–15.4)
WBC: 7.8 10*3/uL (ref 3.4–10.8)

## 2020-03-10 LAB — BASIC METABOLIC PANEL
BUN/Creatinine Ratio: 17 (ref 10–24)
BUN: 45 mg/dL — ABNORMAL HIGH (ref 8–27)
CO2: 22 mmol/L (ref 20–29)
Calcium: 9.2 mg/dL (ref 8.6–10.2)
Chloride: 101 mmol/L (ref 96–106)
Creatinine, Ser: 2.63 mg/dL — ABNORMAL HIGH (ref 0.76–1.27)
GFR calc Af Amer: 26 mL/min/{1.73_m2} — ABNORMAL LOW (ref >59)
GFR calc non Af Amer: 23 mL/min/{1.73_m2} — ABNORMAL LOW (ref >59)
Glucose: 405 mg/dL — ABNORMAL HIGH (ref 65–99)
Potassium: 5.4 mmol/L — ABNORMAL HIGH (ref 3.5–5.2)
Sodium: 136 mmol/L (ref 134–144)

## 2020-03-10 LAB — PRO B NATRIURETIC PEPTIDE: NT-Pro BNP: 670 pg/mL — ABNORMAL HIGH (ref 0–486)

## 2020-03-12 ENCOUNTER — Encounter: Payer: Self-pay | Admitting: Physician Assistant

## 2020-03-12 NOTE — Telephone Encounter (Signed)
Error

## 2020-03-14 ENCOUNTER — Telehealth (HOSPITAL_COMMUNITY): Payer: Self-pay

## 2020-03-14 NOTE — Telephone Encounter (Signed)
Detailed instructions left on the patient's answering machine per updated DPR. Asked to call back with any questions. S.Zeyna Mkrtchyan EMTP

## 2020-03-15 ENCOUNTER — Other Ambulatory Visit: Payer: Self-pay | Admitting: Nephrology

## 2020-03-16 ENCOUNTER — Other Ambulatory Visit: Payer: Self-pay | Admitting: Nephrology

## 2020-03-16 ENCOUNTER — Other Ambulatory Visit: Payer: Self-pay

## 2020-03-16 ENCOUNTER — Telehealth (HOSPITAL_COMMUNITY): Payer: Self-pay | Admitting: Radiology

## 2020-03-16 ENCOUNTER — Other Ambulatory Visit: Payer: Medicare Other | Admitting: *Deleted

## 2020-03-16 ENCOUNTER — Telehealth: Payer: Self-pay | Admitting: Cardiology

## 2020-03-16 DIAGNOSIS — S8001XA Contusion of right knee, initial encounter: Secondary | ICD-10-CM | POA: Insufficient documentation

## 2020-03-16 DIAGNOSIS — N179 Acute kidney failure, unspecified: Secondary | ICD-10-CM

## 2020-03-16 DIAGNOSIS — I5032 Chronic diastolic (congestive) heart failure: Secondary | ICD-10-CM

## 2020-03-16 DIAGNOSIS — N184 Chronic kidney disease, stage 4 (severe): Secondary | ICD-10-CM

## 2020-03-16 HISTORY — DX: Contusion of right knee, initial encounter: S80.01XA

## 2020-03-16 LAB — BASIC METABOLIC PANEL
BUN/Creatinine Ratio: 19 (ref 10–24)
BUN: 37 mg/dL — ABNORMAL HIGH (ref 8–27)
CO2: 22 mmol/L (ref 20–29)
Calcium: 8.9 mg/dL (ref 8.6–10.2)
Chloride: 103 mmol/L (ref 96–106)
Creatinine, Ser: 1.97 mg/dL — ABNORMAL HIGH (ref 0.76–1.27)
GFR calc Af Amer: 37 mL/min/{1.73_m2} — ABNORMAL LOW (ref >59)
GFR calc non Af Amer: 32 mL/min/{1.73_m2} — ABNORMAL LOW (ref >59)
Glucose: 318 mg/dL — ABNORMAL HIGH (ref 65–99)
Potassium: 4.8 mmol/L (ref 3.5–5.2)
Sodium: 137 mmol/L (ref 134–144)

## 2020-03-16 NOTE — Telephone Encounter (Signed)
Patients wife, Romie Minus, called and stated that someone tried to call the patient but he was in a doctors office so he couldn't talk. She is calling to see who called and what it was regarding. She says she thinks it is about imaging that [employer] ordered.

## 2020-03-16 NOTE — Telephone Encounter (Signed)
Pt aware that nuclear medicine called to review instructions for his myocardial perfusion testing next week.  Aware they will reach back out to him to discuss. Fernando Hess in nuc med notified.

## 2020-03-16 NOTE — Telephone Encounter (Signed)
Patient given detailed instructions per Myocardial Perfusion Study Information Sheet for the test on 03/20/2020 at 07:30. Patient notified to arrive 15 minutes early and that it is imperative to arrive on time for appointment to keep from having the test rescheduled.  If you need to cancel or reschedule your appointment, please call the office within 24 hours of your appointment. . Patient verbalized understanding.EHK

## 2020-03-20 ENCOUNTER — Other Ambulatory Visit: Payer: Self-pay

## 2020-03-20 ENCOUNTER — Ambulatory Visit (HOSPITAL_COMMUNITY): Payer: Medicare Other | Attending: Physician Assistant

## 2020-03-20 ENCOUNTER — Telehealth: Payer: Self-pay | Admitting: Physician Assistant

## 2020-03-20 DIAGNOSIS — R55 Syncope and collapse: Secondary | ICD-10-CM

## 2020-03-20 DIAGNOSIS — I25810 Atherosclerosis of coronary artery bypass graft(s) without angina pectoris: Secondary | ICD-10-CM | POA: Insufficient documentation

## 2020-03-20 DIAGNOSIS — R0602 Shortness of breath: Secondary | ICD-10-CM | POA: Diagnosis not present

## 2020-03-20 LAB — MYOCARDIAL PERFUSION IMAGING
LV dias vol: 91 mL (ref 62–150)
LV sys vol: 32 mL
Peak HR: 86 {beats}/min
Rest HR: 61 {beats}/min
SDS: 1
SRS: 1
SSS: 2
TID: 1.01

## 2020-03-20 MED ORDER — REGADENOSON 0.4 MG/5ML IV SOLN
0.4000 mg | Freq: Once | INTRAVENOUS | Status: AC
  Administered 2020-03-20: 0.4 mg via INTRAVENOUS

## 2020-03-20 MED ORDER — TECHNETIUM TC 99M TETROFOSMIN IV KIT
32.3000 | PACK | Freq: Once | INTRAVENOUS | Status: AC | PRN
  Administered 2020-03-20: 32.3 via INTRAVENOUS
  Filled 2020-03-20: qty 33

## 2020-03-20 MED ORDER — TECHNETIUM TC 99M TETROFOSMIN IV KIT
10.4000 | PACK | Freq: Once | INTRAVENOUS | Status: AC | PRN
  Administered 2020-03-20: 10.4 via INTRAVENOUS
  Filled 2020-03-20: qty 11

## 2020-03-20 NOTE — Telephone Encounter (Signed)
I saw his wife today for follow up.  She notes he is still having episodes of syncope.  These occur with standing up. I asked for him to collect BPs over the next week and send to me for review. If BP ok, will place a 30 day monitor for syncope. [name], [name]    03/20/2020 12:08 PM

## 2020-03-21 ENCOUNTER — Encounter: Payer: Self-pay | Admitting: Physician Assistant

## 2020-03-28 NOTE — Telephone Encounter (Signed)
Since he has had recurrent episodes of syncope and his BP is normal, let's get an event monitor to rule out arrhythmia. PLAN:  Arrange 30 day event monitor. [name], [name]    03/28/2020 4:46 PM

## 2020-03-28 NOTE — Telephone Encounter (Signed)
I called and spoke with patient he gave me blood pressure readings for the last few days 129/75, 134/75, 129/74.

## 2020-03-28 NOTE — Telephone Encounter (Signed)
Please call patient and ask if he has been checking BPs over the past week. Let me know (if he has been) the readings. [name], [name]    03/28/2020 4:04 PM

## 2020-03-30 ENCOUNTER — Telehealth: Payer: Self-pay | Admitting: Radiology

## 2020-03-30 NOTE — Telephone Encounter (Signed)
Enrolled patient for a 30 day [employer] Event Monitor to be mailed to patients home. Brief instructions were gone over with the patient and he knows to expect the monitor to arrive in 4-5 days.

## 2020-03-30 NOTE — Telephone Encounter (Signed)
I called and spoke with patient, he is aware per Nicki Reaper, since his blood pressures are within normal limits Scott would like to order a 30 day monitor for syncopal episodes to rule out arrhythmias. Patient verbalized understanding and thanked me for the call. Orders in for 30 day cardiac event monitor.

## 2020-04-06 ENCOUNTER — Encounter: Payer: Self-pay | Admitting: Cardiology

## 2020-04-06 ENCOUNTER — Ambulatory Visit (INDEPENDENT_AMBULATORY_CARE_PROVIDER_SITE_OTHER): Payer: Medicare Other

## 2020-04-06 DIAGNOSIS — R55 Syncope and collapse: Secondary | ICD-10-CM

## 2020-04-07 ENCOUNTER — Telehealth: Payer: Self-pay | Admitting: Medical

## 2020-04-07 DIAGNOSIS — I4891 Unspecified atrial fibrillation: Secondary | ICD-10-CM

## 2020-04-07 DIAGNOSIS — Z79899 Other long term (current) drug therapy: Secondary | ICD-10-CM

## 2020-04-07 DIAGNOSIS — Z7901 Long term (current) use of anticoagulants: Secondary | ICD-10-CM

## 2020-04-07 NOTE — Telephone Encounter (Signed)
   Patient called the answering service back. He has been feeling well today - walked 1 mile this morning without complaints - timing may have correlated with the rhythm strips time, though underlying rhythm more likely atrial fibrillation than artifact. He has no complaints of chest pain, SOB, palpitations, racing heart beat sensations, dizziness, lightheadedness, or syncope. Vitals at the time of my call as follows: BP 147/90, HR 69 bpm. I reviewed atrial fibrillation with the patient and his wife, Romie Minus. I explained that he was at high risk for stroke. I planned to send an Rx for eliquis 5mg  BID for stroke ppx, though they had financial concerns. They were unable to print a coupon for eliquis since they dont have the internet at home. They asked about samples in the office. Would be reasonable to supply samples as we collect more information from his heart monitor. Will send a message to our [employer] office re: eliquis samples. If none available, could give a coupon card and send Rx. Patient was in agreement with the plan.  Abigail Butts, [name] 04/07/20; 4:37 PM

## 2020-04-07 NOTE — Telephone Encounter (Signed)
   Notified by [employer] that patient had first documented atrial fibrillation starting around 12pm today. This was an auto-detection with rate 110 bpm. Last transmission around 12:20pm with ongoing atrial fib with HR in the 130s. Rhythm strips reviewed confirming atrial fibrillation with RVR. Attempted to contact the patient several times and left a voicemail to call the answering service back to discuss these results. Attempted to contact his spouse, however phone went straight to voicemail.   Per chart review patient has struggled with hypotension recently which may limit rate control strategies.   His CHA2DS2-VASc Score and unadjusted Ischemic Stroke Rate (% per year) is equal to 9.7 % stroke rate/year from a score of 6 Above score calculated as 1 point each if present [CHF, HTN, DM, Vascular=MI/PAD/Aortic Plaque, Age if 65-74, or Male] Above score calculated as 2 points each if present [Age > 75, or Stroke/TIA/TE]  He would benefit from anticoagulation going forward for stroke ppx.   Hopeful he will call back over the weekend to discuss, however will route to Dr. Meda Coffee, [name], and [facility] triage to follow-up.  Abigail Butts, [name] 04/07/20; 3:08 PM

## 2020-04-09 MED ORDER — APIXABAN 5 MG PO TABS: 5.0000 mg | ORAL_TABLET | Freq: Two times a day (BID) | ORAL | 3 refills | Status: DC

## 2020-04-09 NOTE — Telephone Encounter (Signed)
Agree with Daleen Snook that patient needs anticoagulation due to stroke risk. Please make sure patient has samples, Rx copay card and request pt assistance if cost is prohibitive. He will need a [facility] appt 6 weeks after starting Eliquis for new start.  Make sure BMET, CBC drawn that day.  Keep appt with Dr. Meda Coffee 06/11/20. [name], [name]    04/09/2020 8:24 AM

## 2020-04-09 NOTE — Telephone Encounter (Signed)
Spoke with the pt and instructed him, per [name] [name], he needs to STOP ASA, when he starts his Eliquis regimen. Removed this from pts med list.  Pt verbalized understanding and agrees with this plan.

## 2020-04-09 NOTE — Telephone Encounter (Signed)
Order for Eliquis 5 mg po bid was placed, as well as CBC and BMET to be done in 6 weeks, same day as he needs to see coumadin clinic per [employer] [name].  Will send [facility] scheduling and Pharmacist a message about setting up the pt an appt in our coumadin clinic per [facility], for management of new start anticoagulation Eliquis.  They will arrange lab same day.  Pt is aware that they will be calling him to arrange this appts.  Will obtain clarification from [facility] about if pt needs to remain on ASA while starting Eliquis, or should this med discontinued.  Will follow-up with pt about this, after clarification is provided.   OFF NOTE: [name] in refills is giving the pt samples and a copay card for Eliquis, right now in the office.  Pt is here to pick them up.

## 2020-04-09 NOTE — Telephone Encounter (Signed)
Thank you! I wasn't sure if CV[facility] was still doing the appt for new start of DOACs.   [name], [name]    04/09/2020 9:34 AM

## 2020-04-09 NOTE — Telephone Encounter (Signed)
Please order Eliquis for this pt, the medication is not on pt's medication list. Please address

## 2020-04-09 NOTE — Telephone Encounter (Signed)
Yes, he can DC ASA once he starts Eliquis. Thanks, [name], [name]    04/09/2020 9:38 AM

## 2020-04-09 NOTE — Addendum Note (Signed)
Addended by: Nuala Alpha on: 04/09/2020 09:04 AM   Modules accepted: Orders

## 2020-04-09 NOTE — Telephone Encounter (Signed)
Pt was given a month supply of Eliquis 5 mg tablet and a 30 day free card. I explained to the pt per Freddie Breech, to let pt know that someone will be calling to set up an appt to get labs done. Pt verbalized understanding.

## 2020-04-09 NOTE — Telephone Encounter (Addendum)
Called patient. We do not need to see patient in office to follow up, but we will call him when his labs come back and review pertinent information such as s/sx of bleeding/ drug interactions and cost. Labs scheduled for 9/7

## 2020-04-09 NOTE — Addendum Note (Signed)
Addended by: Nuala Alpha on: 04/09/2020 09:48 AM   Modules accepted: Orders

## 2020-04-09 NOTE — Telephone Encounter (Signed)
Monitor strips received, in regards to this encounter.  DOD Dr. Angelena Form did sign report and was placed into med rec box to scan.  Treatment plan already discussed and advised to this pt, earlier this morning, and as referenced in this message.

## 2020-04-09 NOTE — Telephone Encounter (Signed)
Refills, per Roby Lofts [name] note, she would like for you to provide the pt with Eliquis samples and co-pay card. Can you please assist and follow-up with the pt? Thanks, Triage

## 2020-04-26 ENCOUNTER — Other Ambulatory Visit: Payer: Self-pay

## 2020-04-26 MED ORDER — FUROSEMIDE 20 MG PO TABS: 20.0000 mg | ORAL_TABLET | Freq: Every day | ORAL | 11 refills | Status: DC

## 2020-05-10 ENCOUNTER — Telehealth: Payer: Self-pay | Admitting: *Deleted

## 2020-05-10 DIAGNOSIS — I495 Sick sinus syndrome: Secondary | ICD-10-CM

## 2020-05-10 DIAGNOSIS — R9431 Abnormal electrocardiogram [ECG] [EKG]: Secondary | ICD-10-CM

## 2020-05-10 DIAGNOSIS — I4891 Unspecified atrial fibrillation: Secondary | ICD-10-CM

## 2020-05-10 NOTE — Telephone Encounter (Signed)
Spoke with the pt and informed him of his monitor results and recommendations per Dr. Meda Coffee, for [place] to refer him to afib clinic.  Informed the pt that I will place the referral in the system and send a message to our [facility] schedulers and [place] [facility] [place] clinic, to call the pt back and arrange this appt. Pt verbalized understanding and agrees with this plan.

## 2020-05-10 NOTE — Telephone Encounter (Signed)
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

## 2020-05-14 ENCOUNTER — Telehealth (INDEPENDENT_AMBULATORY_CARE_PROVIDER_SITE_OTHER): Payer: Medicare Other | Admitting: Internal Medicine

## 2020-05-14 ENCOUNTER — Other Ambulatory Visit: Payer: Self-pay

## 2020-05-14 DIAGNOSIS — R918 Other nonspecific abnormal finding of lung field: Secondary | ICD-10-CM

## 2020-05-14 NOTE — Progress Notes (Signed)
No show for visit. Multiple VMs left for patient. Will reschedule as an in office visit.  Thompson Grayer MD, [place] 05/14/2020 12:13 PM

## 2020-05-22 ENCOUNTER — Ambulatory Visit: Payer: Medicare Other

## 2020-05-22 ENCOUNTER — Telehealth: Payer: Self-pay | Admitting: Pharmacist

## 2020-05-22 ENCOUNTER — Other Ambulatory Visit: Payer: Medicare Other | Admitting: *Deleted

## 2020-05-22 ENCOUNTER — Other Ambulatory Visit: Payer: Self-pay

## 2020-05-22 DIAGNOSIS — Z5181 Encounter for therapeutic drug level monitoring: Secondary | ICD-10-CM

## 2020-05-22 DIAGNOSIS — Z79899 Other long term (current) drug therapy: Secondary | ICD-10-CM

## 2020-05-22 DIAGNOSIS — I4891 Unspecified atrial fibrillation: Secondary | ICD-10-CM

## 2020-05-22 LAB — SPECIMEN STATUS REPORT

## 2020-05-22 LAB — CBC
Hematocrit: 41 % (ref 37.5–51.0)
Hemoglobin: 13.2 g/dL (ref 13.0–17.7)
MCH: 28.3 pg (ref 26.6–33.0)
MCHC: 32.2 g/dL (ref 31.5–35.7)
MCV: 88 fL (ref 79–97)
Platelets: 277 10*3/uL (ref 150–450)
RBC: 4.67 x10E6/uL (ref 4.14–5.80)
RDW: 13.2 % (ref 11.6–15.4)
WBC: 11.9 10*3/uL — ABNORMAL HIGH (ref 3.4–10.8)

## 2020-05-22 LAB — BASIC METABOLIC PANEL
BUN/Creatinine Ratio: 15 (ref 10–24)
BUN: 28 mg/dL — ABNORMAL HIGH (ref 8–27)
CO2: 24 mmol/L (ref 20–29)
Calcium: 9 mg/dL (ref 8.6–10.2)
Chloride: 100 mmol/L (ref 96–106)
Creatinine, Ser: 1.88 mg/dL — ABNORMAL HIGH (ref 0.76–1.27)
GFR calc Af Amer: 39 mL/min/{1.73_m2} — ABNORMAL LOW (ref >59)
GFR calc non Af Amer: 34 mL/min/{1.73_m2} — ABNORMAL LOW (ref >59)
Glucose: 181 mg/dL — ABNORMAL HIGH (ref 65–99)
Potassium: 4.7 mmol/L (ref 3.5–5.2)
Sodium: 138 mmol/L (ref 134–144)

## 2020-05-22 NOTE — Telephone Encounter (Signed)
Called and left VM per DPR that patient does not need to come for pharmd visit today but does need to come for labs.

## 2020-05-23 NOTE — Telephone Encounter (Signed)
Thank you! Pt is seeing Dr. Lurline Hare 9/9 for tachy-brady syndrome. [name], [name]    05/23/2020 2:42 PM

## 2020-05-23 NOTE — Telephone Encounter (Signed)
Called pt to review results. Patient states he's only been taking Eliquis once a day. I advised he needs to take twice a day. States he is still taking the samples he was given. Advised pharmacy should have Rx on file. He should give them the free coupon card he has. Patient states he will talk to pharmacy about Rx and will start taking twice a day. No dose change needed based off of labs.

## 2020-05-23 NOTE — Progress Notes (Signed)
Electrophysiology Office Note:    Date:  05/24/2020   ID:  Fernando Hess, DOB 1946/07/26, MRN [id number]  PCP:  [name], MD  [facility] Cardiologist:  Ena Dawley, MD  [facility] Electrophysiologist:  Vickie Epley, MD   Referring MD: [name], MD   Chief Complaint: AF, Sinus bradycardia  History of Present Illness:    Fernando Hess is a 74 y.o. 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  He cannot tell when he is in normal rhythm or atrial fibrillation. He has multiple recent mechanical falls that prompted the purchase of a cane. Since he has used the cane he has not fallen. He previously had an episode of syncope that occurred near his wife's bed. He had gotten up from a seated position when he felt lightheaded, dizzy and passed out. His wife tells me that he had not eaten that morning because he was meeting his son for lunch. This is atypical for him and he apparently does not tolerate not eating breakfast.    Past Medical History:  Diagnosis Date  . Acute renal failure (ARF) ([place]) 09/24/2015  . Atrial premature complexes   . CAD (coronary artery disease) of artery bypass graft    Early occlusion of saphenous vein graft to intermediate and marginal branch in February 2007 following bypass grafting   . CAD (coronary artery disease), native coronary artery cardiologist-- dr Raliegh Ip. nelson   hx NSTEMI 09-24-2015  s/p  CABG x5 on 10-01-2017;  post op STEMI inferolateral wall,  SVG OM1 and SVG OM2 occluded, distal OM occlusion the calpruit, treated medically // Myoview 7/21: no ischemia, EF 65, low risk   . CKD (chronic kidney disease), stage III   . Elevated troponin   . Erectile dysfunction   . Esophageal reflux   . History of atrial fibrillation    post op CABG 10-02-2015  . History of kidney stones   . History of kidney stones   .  History of non-ST elevation myocardial infarction (NSTEMI) 09/24/2015   s/p  CABG x5  . History of ST elevation myocardial infarction (STEMI) 10/22/2015   inferior wall,  post op CABG 10-02-2015  . Hyperlipidemia   . Hypertension   . Kidney stone    h/o  . Left ureteral stone   . Mild atherosclerosis of both carotid arteries   . Nephrolithiasis    per CT bilateral non-obstructive calculi  . Peripheral artery disease    LE Arterial US 01/2019: R PTA and ATA occluded; L ATA occluded  . RBBB (right bundle branch block)   . Renal atrophy, right   . Sleep apnea    wears cpap   . ST elevation myocardial infarction (STEMI) of inferior wall ([place]) 10/22/2015  . Type 2 diabetes mellitus treated with insulin ([place])    followed by pcp  . 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 Wears glasses     Past Surgical History:  Procedure Laterality Date  . APPENDECTOMY  1965  . CARDIAC CATHETERIZATION N/A 09/26/2015   Procedure: Left Heart Cath and Coronary Angiography;  Surgeon: Troy Sine, MD;  Location: [place] [facility];  Service: Cardiovascular;  Laterality: N/A;  . CARDIAC CATHETERIZATION N/A 10/22/2015   Procedure: Left Heart Cath and Coronary Angiography;  Surgeon:  Sherren Mocha, MD;  Location: [place] [facility];  Service: Cardiovascular;  Laterality: N/A;  . CATARACT EXTRACTION W/ INTRAOCULAR LENS  IMPLANT, BILATERAL  2017  . COLONOSCOPY    . CORONARY ARTERY BYPASS GRAFT N/A 10/02/2015   Procedure: CORONARY ARTERY BYPASS GRAFTING (CABG) X5 LIMA-LAD; SVG-DIAG; SVG-OM; SVG-PD; SVG-RAMUS TRANSESOPHAGEAL ECHOCARDIOGRAM (TEE) ENDOSCOPIC GREATER SAPHENOUS VEIN HARVEST BILAT LE;  Surgeon: Ivin Poot, MD;  Location: [place];  Service: Open Heart Surgery;  Laterality: N/A;  . CYSTOSCOPY/URETEROSCOPY/HOLMIUM LASER/STENT PLACEMENT Left 08/10/2018   Procedure: CYSTOSCOPY/URETEROSCOPY/HOLMIUM LASER/STENT  PLACEMENT;  Surgeon: Festus Aloe, MD;  Location: [facility];  Service: Urology;  Laterality: Left;  . CYSTOSCOPY/URETEROSCOPY/HOLMIUM LASER/STENT PLACEMENT Left 09/10/2018   Procedure: CYSTOSCOPY/URETEROSCOPY/HOLMIUM LASER/STENT EXCHANGE;  Surgeon: Festus Aloe, MD;  Location: [facility];  Service: Urology;  Laterality: Left;  . LEFT HEART CATHETERIZATION WITH CORONARY ANGIOGRAM N/A 04/13/2014   Procedure: LEFT HEART CATHETERIZATION WITH CORONARY ANGIOGRAM;  Surgeon: Jacolyn Reedy, MD;  Location: [facility];  Service: Cardiovascular;  Laterality: N/A;  . LEG SURGERY Right age 65   closed reduction leg fracture  . POLYPECTOMY    . TEE WITHOUT CARDIOVERSION N/A 10/02/2015   Procedure: TRANSESOPHAGEAL ECHOCARDIOGRAM (TEE);  Surgeon: Ivin Poot, MD;  Location: [place];  Service: Open Heart Surgery;  Laterality: N/A;  . URETEROSCOPY WITH HOLMIUM LASER LITHOTRIPSY Bilateral 2004;  2005  dr grapey  @[email or web address]   . VASECTOMY      Current Medications: Current Meds  Medication Sig  . apixaban (ELIQUIS) 5 MG TABS tablet Take 1 tablet (5 mg total) by mouth 2 (two) times daily.  [name] atorvastatin (LIPITOR) 40 MG tablet Take 40 mg by mouth daily.   . B-D UF III MINI PEN NEEDLES 31G X 5 MM MISC USE AS DIRECTED WITH LANTUS SOLOSTAR  . busPIRone (BUSPAR) 10 MG tablet Take 1 tablet (10 mg total) by mouth at bedtime.  . DULoxetine (CYMBALTA) 30 MG capsule Take 30 mg by mouth at bedtime.  . DULoxetine (CYMBALTA) 60 MG capsule Take 60 mg by mouth daily.   . furosemide (LASIX) 20 MG tablet Take 20 mg by mouth 3 (three) times a week.  . Insulin Glargine (BASAGLAR KWIKPEN) 100 UNIT/ML SOPN Inject 31 Units into the skin daily.   [name] MAGNESIUM-ZINC PO Take 1 tablet by mouth daily.  . Melatonin 3 MG TABS Take 1 tablet by mouth daily as needed (for sleep).   . metoprolol succinate (TOPROL-XL) 25 MG 24 hr tablet TAKE 1/2 TABLET BY MOUTH EVERY DAY  . Multiple Vitamin (MULTIVITAMIN) tablet Take 1 tablet  by mouth daily.  . nitroGLYCERIN (NITROSTAT) 0.4 MG SL tablet Place 0.4 mg under the tongue every 5 (five) minutes as needed for chest pain.  Glory Rosebush ULTRA test strip FOR USE OF CHEACKING BLOOD ONCE DAILY  . pantoprazole (PROTONIX) 40 MG tablet TAKE 1 TABLET BY MOUTH EVERY DAY  . pioglitazone (ACTOS) 45 MG tablet Take 45 mg by mouth daily.     Allergies:   Levaquin [levofloxacin], Lisinopril, and Victoza [liraglutide]   Social History   Socioeconomic History  . Marital status: Married    Spouse name: Not on file  . Number of children: Not on file  . Years of education: Not on file  . Highest education level: Not on file  Occupational History  . Occupation: retired  Tobacco Use  . Smoking status: Never Smoker  . Smokeless tobacco: Never Used  Vaping Use  . Vaping Use: Never used  Substance  and Sexual Activity  . Alcohol use: Not Currently  . Drug use: Never  . Sexual activity: Not on file  Other Topics Concern  . 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 Difficulty of Paying Living Expenses: Not on file  Food Insecurity:   . Worried About Charity fundraiser in the Last Year: Not on file  . Ran Out of Food in the Last Year: Not on file  Transportation Needs:   . Lack of Transportation (Medical): Not on file  . Lack of Transportation (Non-Medical): Not on file  Physical Activity:   . Days of Exercise per Week: Not on file  . Minutes of Exercise per Session: Not on file  Stress:   . Feeling of Stress : Not on file  Social Connections:   . Frequency of Communication with Friends and Family: Not on file  . Frequency of Social Gatherings with Friends and Family: Not on file  . Attends Religious Services: Not on file  . Active Member of Clubs or Organizations: Not on file  . Attends Archivist Meetings: Not on file  . Marital Status:  Not on file     Family History: The patient's family history includes Diabetes in his brother and brother; Heart attack in his father and mother; Pancreatic cancer in his brother. There is no history of Colon cancer, Esophageal cancer, Prostate cancer, Rectal cancer, Stomach cancer, or Colon polyps.  ROS:   Please see the history of present illness.    All other systems reviewed and are negative.  EKGs/Labs/Other Studies Reviewed:    The following studies were reviewed today:  05/09/2020 30day monitor Sinus brady with HR 48bpm, pauses up to 3 seconds during awake hours, frequent atrial fibrillation with RVR (2% of the monitoring time)  11/19/2019 Echo 1. Normal LV systolic function; moderate LVH; grade 1 diastolic  dysfunction; mildly dilated ascending aorta; mild AI and MR.  2. Left ventricular ejection fraction, by estimation, is 60 to 65%. The  left ventricle has normal function. The left ventricle has no regional  wall motion abnormalities. There is moderate left ventricular hypertrophy.  Left ventricular diastolic  parameters are consistent with Grade I diastolic dysfunction (impaired  relaxation).  3. Right ventricular systolic function is normal. The right ventricular  size is normal.  4. The mitral valve is normal in structure and function. Mild mitral  valve regurgitation. No evidence of mitral stenosis.  5. The aortic valve is normal in structure and function. Aortic valve  regurgitation is mild. Mild to moderate aortic valve  sclerosis/calcification is present, without any evidence of aortic  stenosis.  6. Aortic dilatation noted. There is mild dilatation of the ascending  aorta measuring 39 mm.  7. The inferior vena cava is normal in size with greater than 50%  respiratory variability, suggesting right atrial pressure of 3 mmHg.   03/09/2020 ECG shows sinus with RBBB. PACs.   EKG:  The ekg ordered today demonstrates sinus rhythm with PACs.  Recent  Labs: 11/24/2019: B Natriuretic Peptide 84.7 03/09/2020: NT-Pro BNP 670 05/22/2020: BUN 28; Creatinine, Ser 1.88; Hemoglobin 13.2; Platelets 277; Potassium 4.7; Sodium 138  Recent Lipid Panel    Component Value Date/Time   CHOL 81 10/23/2015 0515   TRIG 102 10/23/2015 0515   HDL 30 (L) 10/23/2015 0515  CHOLHDL 2.7 10/23/2015 0515   VLDL 20 10/23/2015 0515   LDLCALC 31 10/23/2015 0515    Physical Exam:    VS:  BP 98/64   Pulse 63   Ht 5\' 6"  (1.676 m)   Wt 203 lb 9.6 oz (92.4 kg)   SpO2 98%   BMI 32.86 kg/m     Wt Readings from Last 3 Encounters:  05/24/20 203 lb 9.6 oz (92.4 kg)  03/20/20 216 lb (98 kg)  03/09/20 216 lb 6.4 oz (98.2 kg)     GEN:  Well nourished, well developed in no acute distress. Obese. HEENT: Normal NECK: No JVD; No carotid bruits LYMPHATICS: No lymphadenopathy CARDIAC: RRR, no murmurs, rubs, gallops RESPIRATORY:  Clear to auscultation without rales, wheezing or rhonchi  ABDOMEN: Soft, non-tender, non-distended MUSCULOSKELETAL:  No edema; No deformity  SKIN: Warm and dry NEUROLOGIC:  Alert and oriented x 3 PSYCHIATRIC:  Normal affect   ASSESSMENT:    1. Tachy-brady syndrome ([place])   2. Atrial fibrillation, unspecified type ([place])   3. Diabetes mellitus type 2, noninsulin dependent ([place])    PLAN:    In order of problems listed above:  1. Tachy-brady syndrome Patient paroxysmal atrial fibrillation that is largely asymptomatic. He does convert to normal rhythm frequently on the zio but the post conversion pauses are short lasting only 3 seconds. The wife and patient are very confident that the reason for his passing out that day was because he skipped breakfast. Despite multiple pauses on the monitor he has not experienced another episode of passing out. I offered continued monitoring of the patient with a loop recorder but they declined.   2. Atrial fibrillation, paroxysmal CHADSVASc of 5 (age, DM, vascular, HTN) on eliquis. Emphasized twice daily  dosing of the apixaban given he told me that he previously was taking it only once daily. Continue metoprolol.   3. DM2 Continue insulin, actos   Medication Adjustments/Labs and Tests Ordered: Current medicines are reviewed at length with the patient today.  Concerns regarding medicines are outlined above.  Orders Placed This Encounter  Procedures  . EKG 12-Lead   No orders of the defined types were placed in this encounter.   Patient Instructions  Medication Instructions:  Your physician recommends that you continue on your current medications as directed. Please refer to the Current Medication list given to you today.  *If you need a refill on your cardiac medications before your next appointment, please call your pharmacy*  Lab Work: None ordered.  If you have labs (blood work) drawn today and your tests are completely normal, you will receive your results only by: [name] MyChart Message (if you have MyChart) OR . A paper copy in the mail If you have any lab test that is abnormal or we need to change your treatment, we will call you to review the results.  Testing/Procedures: None ordered.  Follow-Up: At [facility], you and your health needs are our priority.  As part of our continuing mission to provide you with exceptional heart care, we have created designated Provider Care Teams.  These Care Teams include your primary Cardiologist (physician) and Advanced Practice Providers (APPs -  Physician Assistants and Nurse Practitioners) who all work together to provide you with the care you need, when you need it.  We recommend signing up for the patient portal called "MyChart".  Sign up information is provided on this After Visit Summary.  MyChart is used to connect with patients for Virtual Visits (Telemedicine).  Patients are able to view lab/test results, encounter notes, upcoming appointments, etc.  Non-urgent messages can be sent to your provider as well.   To learn more about  what you can do with MyChart, go to [email or web address].    Your next appointment:   Your physician wants you to follow-up in: as needed    Other Instructions:     Signed, Lars Mage, MD, [facility]  05/24/2020 11:14 AM    Electrophysiology [place]

## 2020-05-24 ENCOUNTER — Other Ambulatory Visit: Payer: Self-pay

## 2020-05-24 ENCOUNTER — Encounter: Payer: Self-pay | Admitting: Cardiology

## 2020-05-24 ENCOUNTER — Ambulatory Visit (INDEPENDENT_AMBULATORY_CARE_PROVIDER_SITE_OTHER): Payer: Medicare Other | Admitting: Cardiology

## 2020-05-24 VITALS — BP 98/64 | HR 63 | Ht 66.0 in | Wt 203.6 lb

## 2020-05-24 DIAGNOSIS — I495 Sick sinus syndrome: Secondary | ICD-10-CM

## 2020-05-24 DIAGNOSIS — I4891 Unspecified atrial fibrillation: Secondary | ICD-10-CM | POA: Diagnosis not present

## 2020-05-24 DIAGNOSIS — E119 Type 2 diabetes mellitus without complications: Secondary | ICD-10-CM

## 2020-05-24 NOTE — Patient Instructions (Addendum)
Medication Instructions:  Your physician recommends that you continue on your current medications as directed. Please refer to the Current Medication list given to you today.  *If you need a refill on your cardiac medications before your next appointment, please call your pharmacy*  Lab Work: None ordered.  If you have labs (blood work) drawn today and your tests are completely normal, you will receive your results only by: . MyChart Message (if you have MyChart) OR . A paper copy in the mail If you have any lab test that is abnormal or we need to change your treatment, we will call you to review the results.  Testing/Procedures: None ordered.  Follow-Up: At [facility], you and your health needs are our priority.  As part of our continuing mission to provide you with exceptional heart care, we have created designated Provider Care Teams.  These Care Teams include your primary Cardiologist (physician) and Advanced Practice Providers (APPs -  Physician Assistants and Nurse Practitioners) who all work together to provide you with the care you need, when you need it.  We recommend signing up for the patient portal called "MyChart".  Sign up information is provided on this After Visit Summary.  MyChart is used to connect with patients for Virtual Visits (Telemedicine).  Patients are able to view lab/test results, encounter notes, upcoming appointments, etc.  Non-urgent messages can be sent to your provider as well.   To learn more about what you can do with MyChart, go to [email or web address].    Your next appointment:   Your physician wants you to follow-up in: as needed    Other Instructions:  

## 2020-06-11 ENCOUNTER — Other Ambulatory Visit: Payer: Self-pay

## 2020-06-11 ENCOUNTER — Ambulatory Visit (INDEPENDENT_AMBULATORY_CARE_PROVIDER_SITE_OTHER): Payer: Medicare Other | Admitting: Cardiology

## 2020-06-11 ENCOUNTER — Encounter: Payer: Self-pay | Admitting: Cardiology

## 2020-06-11 VITALS — BP 120/64 | HR 63 | Ht 66.0 in | Wt 202.2 lb

## 2020-06-11 DIAGNOSIS — Z794 Long term (current) use of insulin: Secondary | ICD-10-CM

## 2020-06-11 DIAGNOSIS — I25810 Atherosclerosis of coronary artery bypass graft(s) without angina pectoris: Secondary | ICD-10-CM

## 2020-06-11 DIAGNOSIS — R296 Repeated falls: Secondary | ICD-10-CM

## 2020-06-11 DIAGNOSIS — E113392 Type 2 diabetes mellitus with moderate nonproliferative diabetic retinopathy without macular edema, left eye: Secondary | ICD-10-CM

## 2020-06-11 DIAGNOSIS — E785 Hyperlipidemia, unspecified: Secondary | ICD-10-CM

## 2020-06-11 DIAGNOSIS — I2510 Atherosclerotic heart disease of native coronary artery without angina pectoris: Secondary | ICD-10-CM

## 2020-06-11 DIAGNOSIS — Z79899 Other long term (current) drug therapy: Secondary | ICD-10-CM

## 2020-06-11 DIAGNOSIS — Z951 Presence of aortocoronary bypass graft: Secondary | ICD-10-CM

## 2020-06-11 DIAGNOSIS — I10 Essential (primary) hypertension: Secondary | ICD-10-CM

## 2020-06-11 NOTE — Patient Instructions (Signed)
Medication Instructions:   Your physician recommends that you continue on your current medications as directed. Please refer to the Current Medication list given to you today.  *If you need a refill on your cardiac medications before your next appointment, please call your pharmacy*  Lab Work:  TODAY--TSH, LIPIDS, AND HEMOGLOBIN A1C  If you have labs (blood work) drawn today and your tests are completely normal, you will receive your results only by:  [place] (if you have MyChart) OR  A paper copy in the mail If you have any lab test that is abnormal or we need to change your treatment, we will call you to review the results.  Testing/Procedures:  Your physician has requested that you have a carotid duplex. This test is an ultrasound of the carotid arteries in your neck. It looks at blood flow through these arteries that supply the brain with blood. Allow one hour for this exam. There are no restrictions or special instructions.   Follow-Up: At [facility], you and your health needs are our priority.  As part of our continuing mission to provide you with exceptional heart care, we have created designated Provider Care Teams.  These Care Teams include your primary Cardiologist (physician) and Advanced Practice Providers (APPs -  Physician Assistants and Nurse Practitioners) who all work together to provide you with the care you need, when you need it.  We recommend signing up for the patient portal called "MyChart".  Sign up information is provided on this After Visit Summary.  MyChart is used to connect with patients for Virtual Visits (Telemedicine).  Patients are able to view lab/test results, encounter notes, upcoming appointments, etc.  Non-urgent messages can be sent to your provider as well.   To learn more about what you can do with MyChart, go to [email or web address].    Your next appointment:   6 month(s)  The format for your next appointment:   In  Person  Provider:   Ena Dawley, MD

## 2020-06-11 NOTE — Progress Notes (Signed)
Cardiology Office Note:    Date:  06/11/2020   ID:  Fernando Hess, DOB [birth year], MRN [id number]  PCP:  [name], MD  Cardiologist:  Ena Dawley, MD (patient request)  Referring MD: [name], MD   Chief complaint: Dizziness recurrent falls.  History of Present Illness:    Fernando Hess is a 74 y.o. male with coronary artery disease status post CABG, diabetes, hypertension, hyperlipidemia, chronic kidney disease who is being seen today to establish with Cardiology at the request of [name], MD.  [name] suffered a myocardial infarction in January 2017 and underwent multivessel CABG.  Postoperative course was complicated by atrial fibrillation treated with amiodarone.  He presented back to the hospital in 10/2015 with an inferolateral ST elevation myocardial infarction.  He had significant hyperkalemia and nausea and vomiting.  Amiodarone was stopped secondary to nausea and vomiting.  Cardiac catheterization demonstrated an occluded SVG-OM1 and occluded SVG-OM2.  His distal OM was occluded.  The distal OM occlusion was felt to be the culprit for his myocardial infarction.  He had diabetic appearing vessels and medical therapy was recommended.    The patient has been struggling with episode of falls as well as shortness of breath.  With regards to shortness of breath he tries to walk a mile every day and has to stop several times, he underwent nuclear stress test in July of this year that showed no evidence of prior infarct or ischemia.  With regards to dizziness he says that it has improved and he has not had any falls since he started to use a cane.  He saw Dr. 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 [name] recommended a loop monitor however patient is not interested.  He denies any lower extremity edema, continues to use Lasix 3 times a week, he also uses CPAP at night that helps his sleep.  He is tolerating  atorvastatin with use of magnesium.   PAD Screen 06/09/2018  Previous PAD dx? No  Previous surgical procedure? No  Pain with walking? No  Feet/toe relief with dangling? No  Painful, non-healing ulcers? No  Extremities discolored? No    Prior CV studies:   The following studies were reviewed today:  Echocardiogram 10/23/2015 EF 55-60, normal wall motion, mildly calcified aortic valve leaflets, mild AI, mild to moderate MR  Cardiac catheterization 10/22/2015 LM ostial 50 LAD mid 90 RI ostial 90 LCx proximal 80; OM1 80; OM2 100 (culprit for MI) RCA mid 50 LIMA-LAD patent SVG-RPDA patent SVG-D1 patent SVG-OM1 100 SVG-OM2 100 Medical therapy  Pre-CABG Dopplers 09/28/2015 Summary: Findings suggest 1-39% internal carotid artery stenosis bilaterally. Vertebral arteries are patent with antegrade flow. Bilateral ABIs are within normal limits.   Past Medical History:  Diagnosis Date  . Acute renal failure (ARF) ([place]) 09/24/2015  . Atrial premature complexes   . CAD (coronary artery disease) of artery bypass graft    Early occlusion of saphenous vein graft to intermediate and marginal branch in February 2007 following bypass grafting   . CAD (coronary artery disease), native coronary artery cardiologist-- dr Fernando Hess. Fernando Hess   hx NSTEMI 09-24-2015  s/p  CABG x5 on 10-01-2017;  post op STEMI inferolateral wall,  SVG OM1 and SVG OM2 occluded, distal OM occlusion the calpruit, treated medically // Myoview 7/21: no ischemia, EF 65, low risk   . CKD (chronic kidney disease), stage III   . Elevated troponin   . Erectile dysfunction   .  Esophageal reflux   . History of atrial fibrillation    post op CABG 10-02-2015  . History of kidney stones   . History of kidney stones   . History of non-ST elevation myocardial infarction (NSTEMI) 09/24/2015   s/p  CABG x5  . History of ST elevation myocardial infarction (STEMI) 10/22/2015   inferior wall,  post op CABG 10-02-2015  . Hyperlipidemia   .  Hypertension   . Kidney stone    h/o  . Left ureteral stone   . Mild atherosclerosis of both carotid arteries   . Nephrolithiasis    per CT bilateral non-obstructive calculi  . Peripheral artery disease    LE Arterial US 01/2019: R PTA and ATA occluded; L ATA occluded  . RBBB (right bundle branch block)   . Renal atrophy, right   . Sleep apnea    wears cpap   . ST elevation myocardial infarction (STEMI) of inferior wall ([place]) 10/22/2015  . Type 2 diabetes mellitus treated with insulin ([place])    followed by pcp  . 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 Wears glasses     Past Surgical History:  Procedure Laterality Date  . APPENDECTOMY  1965  . CARDIAC CATHETERIZATION N/A 09/26/2015   Procedure: Left Heart Cath and Coronary Angiography;  Surgeon: Troy Sine, MD;  Location: [facility];  Service: Cardiovascular;  Laterality: N/A;  . CARDIAC CATHETERIZATION N/A 10/22/2015   Procedure: Left Heart Cath and Coronary Angiography;  Surgeon: Sherren Mocha, MD;  Location: [facility];  Service: Cardiovascular;  Laterality: N/A;  . CATARACT EXTRACTION W/ INTRAOCULAR LENS  IMPLANT, BILATERAL  2017  . COLONOSCOPY    . CORONARY ARTERY BYPASS GRAFT N/A 10/02/2015   Procedure: CORONARY ARTERY BYPASS GRAFTING (CABG) X5 LIMA-LAD; SVG-DIAG; SVG-OM; SVG-PD; SVG-RAMUS TRANSESOPHAGEAL ECHOCARDIOGRAM (TEE) ENDOSCOPIC GREATER SAPHENOUS VEIN HARVEST BILAT LE;  Surgeon: Ivin Poot, MD;  Location: [facility];  Service: Open Heart Surgery;  Laterality: N/A;  . CYSTOSCOPY/URETEROSCOPY/HOLMIUM LASER/STENT PLACEMENT Left 08/10/2018   Procedure: CYSTOSCOPY/URETEROSCOPY/HOLMIUM LASER/STENT PLACEMENT;  Surgeon: Festus Aloe, MD;  [facility];  Service: Urology;  Laterality: Left;  . CYSTOSCOPY/URETEROSCOPY/HOLMIUM LASER/STENT PLACEMENT Left 09/10/2018   Procedure:  CYSTOSCOPY/URETEROSCOPY/HOLMIUM LASER/STENT EXCHANGE;  Surgeon: Festus Aloe, MD;  Location: [facility];  Service: Urology;  Laterality: Left;  . LEFT HEART CATHETERIZATION WITH CORONARY ANGIOGRAM N/A 04/13/2014   Procedure: LEFT HEART CATHETERIZATION WITH CORONARY ANGIOGRAM;  Surgeon: Jacolyn Reedy, MD;  [facility];  Service: Cardiovascular;  Laterality: N/A;  . LEG SURGERY Right age 64   closed reduction leg fracture  . POLYPECTOMY    . TEE WITHOUT CARDIOVERSION N/A 10/02/2015   Procedure: TRANSESOPHAGEAL ECHOCARDIOGRAM (TEE);  Surgeon: Ivin Poot, MD;  Location: [place] [facility];  Service: Open Heart Surgery;  Laterality: N/A;  . URETEROSCOPY WITH HOLMIUM LASER LITHOTRIPSY Bilateral 2004;  2005  dr grapey  @[email or web address]   . VASECTOMY      Current Medications: Current Meds  Medication Sig  . apixaban (ELIQUIS) 5 MG TABS tablet Take 1 tablet (5 mg total) by mouth 2 (two) times daily.  [name] atorvastatin (LIPITOR) 40 MG tablet Take 40 mg by mouth daily.   . B-D UF III MINI PEN NEEDLES 31G X 5 MM MISC USE AS DIRECTED WITH LANTUS SOLOSTAR  . busPIRone (BUSPAR) 10 MG tablet Take 1 tablet (10 mg total) by mouth at bedtime.  . DULoxetine (CYMBALTA)  30 MG capsule Take 30 mg by mouth at bedtime.  . DULoxetine (CYMBALTA) 60 MG capsule Take 60 mg by mouth daily.   . furosemide (LASIX) 20 MG tablet Take 20 mg by mouth 3 (three) times a week.  . Insulin Glargine (BASAGLAR KWIKPEN) 100 UNIT/ML SOPN Inject 31 Units into the skin daily.   [name] MAGNESIUM-ZINC PO Take 1 tablet by mouth daily.  . Melatonin 3 MG TABS Take 1 tablet by mouth daily as needed (for sleep).   . metoprolol succinate (TOPROL-XL) 25 MG 24 hr tablet TAKE 1/2 TABLET BY MOUTH EVERY DAY  . Multiple Vitamin (MULTIVITAMIN) tablet Take 1 tablet by mouth daily.  . nitroGLYCERIN (NITROSTAT) 0.4 MG SL tablet Place 0.4 mg under the tongue every 5 (five) minutes as needed for chest pain.  [name] ondansetron (ZOFRAN-ODT) 4 MG disintegrating tablet Take 4 mg  by mouth every 8 (eight) hours as needed for nausea or vomiting.  Glory Rosebush ULTRA test strip FOR USE OF CHEACKING BLOOD ONCE DAILY  . pantoprazole (PROTONIX) 40 MG tablet TAKE 1 TABLET BY MOUTH EVERY DAY  . pioglitazone (ACTOS) 45 MG tablet Take 45 mg by mouth daily.     Allergies:   Levaquin [levofloxacin], Lisinopril, and Victoza [liraglutide]   Social History   Socioeconomic History  . Marital status: Married    Spouse name: Not on file  . Number of children: Not on file  . Years of education: Not on file  . Highest education level: Not on file  Occupational History  . Occupation: retired  Tobacco Use  . Smoking status: Never Smoker  . Smokeless tobacco: Never Used  Vaping Use  . Vaping Use: Never used  Substance and Sexual Activity  . Alcohol use: Not Currently  . Drug use: Never  . Sexual activity: Not on file  Other Topics Concern  . 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 Difficulty of Paying Living Expenses: Not on file  Food Insecurity:   . Worried About Charity fundraiser in the Last Year: Not on file  . Ran Out of Food in the Last Year: Not on file  Transportation Needs:   . Lack of Transportation (Medical): Not on file  . Lack of Transportation (Non-Medical): Not on file  Physical Activity:   . Days of Exercise per Week: Not on file  . Minutes of Exercise per Session: Not on file  Stress:   . Feeling of Stress : Not on file  Social Connections:   . Frequency of Communication with Friends and Family: Not on file  . Frequency of Social Gatherings with Friends and Family: Not on file  . Attends Religious Services: Not on file  . Active Member of Clubs or Organizations: Not on file  . Attends Archivist Meetings: Not on file  . Marital Status: Not on file     Family Hx: The patient's family history includes  Diabetes in his brother and brother; Heart attack in his father and mother; Pancreatic cancer in his brother. There is no history of Colon cancer, Esophageal cancer, Prostate cancer, Rectal cancer, Stomach cancer, or Colon polyps.  ROS:   Please see the history of present illness.    Review of Systems  Eyes: Positive for visual disturbance.  Respiratory: Positive for cough.   Neurological: Positive for  headaches.   All other systems reviewed and are negative.   EKGs/Labs/Other Test Reviewed:    EKG:  EKG is  ordered today.  The ekg ordered today demonstrates sinus rhythm, with PACs, right bundle branch block is no longer present.  This was personally reviewed.    Recent Labs: 11/24/2019: B Natriuretic Peptide 84.7 03/09/2020: NT-Pro BNP 670 05/22/2020: BUN 28; Creatinine, Ser 1.88; Hemoglobin 13.2; Platelets 277; Potassium 4.7; Sodium 138   Recent Lipid Panel Lab Results  Component Value Date/Time   CHOL 81 10/23/2015 05:15 AM   TRIG 102 10/23/2015 05:15 AM   HDL 30 (L) 10/23/2015 05:15 AM   CHOLHDL 2.7 10/23/2015 05:15 AM   LDLCALC 31 10/23/2015 05:15 AM   From KPN Tool: Cholesterol, total 114.000 03/10/2018 HDL 50.000 03/10/2018 LDL 52.000 03/10/2018 Triglycerides 60.000 03/10/2018 A1C 8.800 03/10/2018 Hemoglobin 14.500 03/10/2018 Creatinine, Serum 2.020 04/09/2018 Potassium 5.100 04/09/2018 ALT (SGPT) 28.000 04/09/2018 TSH 0.468 10/26/2015 Platelets 173.000 12/01/2016  Physical Exam:    VS:  BP 120/64   Pulse 63   Ht 5\' 6"  (1.676 m)   Wt 202 lb 3.2 oz (91.7 kg)   SpO2 96%   BMI 32.64 kg/m     Wt Readings from Last 3 Encounters:  06/11/20 202 lb 3.2 oz (91.7 kg)  05/24/20 203 lb 9.6 oz (92.4 kg)  03/20/20 216 lb (98 kg)     Physical Exam Constitutional:      General: He is not in acute distress.    Appearance: He is well-developed.  HENT:     Head: Normocephalic and atraumatic.  Neck:     Thyroid: No thyromegaly.     Vascular: No carotid bruit or JVD.   Cardiovascular:     Rate and Rhythm: Normal rate and regular rhythm.     Heart sounds: Normal heart sounds. No murmur heard.   Pulmonary:     Effort: Pulmonary effort is normal.     Breath sounds: Normal breath sounds. No rales.  Abdominal:     Palpations: Abdomen is soft. There is no hepatomegaly.  Lymphadenopathy:     Cervical: No cervical adenopathy.  Skin:    General: Skin is warm and dry.  Neurological:     Mental Status: He is alert and oriented to person, place, and time.     ASSESSMENT & PLAN:    Coronary artery disease involving coronary bypass graft of native heart without angina pectoris Hx of CABG in 2017.  He returned to the hospital several weeks later with an inferolateral STEMI.  The vein grafts to both OMs were occluded and the culprit was a distal OM occlusion.  He was treated medically.  -He underwent Lexiscan nuclear stress test in July of this year with no evidence for infarct or ischemia, he is encouraged to continue walking.  Recurrent falls -Improved with use of cane.  We will obtain carotid ultrasound.  Essential hypertension  BP controlled on no medications.  Hyperlipidemia, unspecified hyperlipidemia type  We will obtain lipids today, as well as TSH and hemoglobin A1c.  Continue atorvastatin.  Tachy-brady syndrome Patient paroxysmal atrial fibrillation that is largely asymptomatic. He does convert to normal rhythm frequently on the zio but the post conversion pauses are short lasting only 3 seconds.  He was offered loop monitor however refused.  He is being followed by Dr. [name].  Atrial fibrillation, paroxysmal CHADSVASc of 5 (age, DM, vascular, HTN) on eliquis. Emphasized twice daily dosing of the apixaban given he told me that he previously  was taking it only once daily.  His last hemoglobin was 13. Continue metoprolol.   DM2 Continue insulin, actos, consider Januvia if hemoglobin A1c elevated.  Dispo:  No follow-ups on file.   Medication  Adjustments/Labs and Tests Ordered: Current medicines are reviewed at length with the patient today.  Concerns regarding medicines are outlined above.  Orders/Tests:  Orders Placed This Encounter  Procedures  . TSH  . HgB A1c  . Lipid Profile  . EKG 12-Lead  . VAS US CAROTID   Medication changes: No orders of the defined types were placed in this encounter.  Signed, Ena Dawley, MD  06/11/2020 9:06 AM    [address], [place], [place]  [address] Phone: [phone]; Fax: [phone]

## 2020-06-12 ENCOUNTER — Telehealth: Payer: Self-pay | Admitting: *Deleted

## 2020-06-12 LAB — LIPID PANEL
Chol/HDL Ratio: 3.1 ratio (ref 0.0–5.0)
Cholesterol, Total: 129 mg/dL (ref 100–199)
HDL: 41 mg/dL (ref >39)
LDL Chol Calc (NIH): 72 mg/dL (ref 0–99)
Triglycerides: 84 mg/dL (ref 0–149)
VLDL Cholesterol Cal: 16 mg/dL (ref 5–40)

## 2020-06-12 LAB — TSH: TSH: 0.815 u[IU]/mL (ref 0.450–4.500)

## 2020-06-12 LAB — HEMOGLOBIN A1C
Est. average glucose Bld gHb Est-mCnc: 272 mg/dL
Hgb A1c MFr Bld: 11.1 % — ABNORMAL HIGH (ref 4.8–5.6)

## 2020-06-12 NOTE — Telephone Encounter (Signed)
I spoke with patient's wife and reviewed lab results with her.  Results faxed through Epic to Dr Harrington Challenger per wife's request. Wife reports patient discussed shortness of breath with Dr Meda Coffee at office visit yesterday.  This has been going on for awhile and wife feels  shortness of breath is worsening. She reports patient started having shortness of breath yesterday after lunch.  Poor appetite at lunch and dinner.  Wife asked patient if he was having indigestion. He denied and said it was lower in his intestinal area.  They will follow up with PCP if this continues.  Wife is asking when patient should take extra lasix.  I reviewed guidelines outlined in 6/25 office note with her.  No recent weight gain. Has lost 4 lbs in the last few weeks.  Patient continued to have shortness of breath last evening.  He sat in recliner and then eventually went to bed.  Wife has not checked with him today regarding shortness of breath as she is trying to let him rest.  She said she did hear him get up to the bathroom. Wife wants to make Dr Meda Coffee aware of shortness of breath and is asking if any of his medications could be contributing to this.

## 2020-06-12 NOTE — Telephone Encounter (Signed)
-----   Message from Dorothy Spark, MD sent at 06/12/2020 11:04 AM EDT ----- Excellent lipid panel, very elevated HbA1c, he needs to follow with PCP

## 2020-06-21 ENCOUNTER — Other Ambulatory Visit: Payer: Self-pay

## 2020-06-21 ENCOUNTER — Ambulatory Visit (HOSPITAL_COMMUNITY)
Admission: RE | Admit: 2020-06-21 | Discharge: 2020-06-21 | Disposition: A | Discharge status: Home / Self Care | Payer: Medicare Other | Source: Ambulatory Visit | Attending: Cardiology | Admitting: Cardiology

## 2020-06-21 DIAGNOSIS — R296 Repeated falls: Secondary | ICD-10-CM | POA: Diagnosis not present

## 2020-06-21 DIAGNOSIS — R42 Dizziness and giddiness: Secondary | ICD-10-CM

## 2020-06-25 ENCOUNTER — Other Ambulatory Visit: Payer: Self-pay

## 2020-06-25 ENCOUNTER — Encounter (INDEPENDENT_AMBULATORY_CARE_PROVIDER_SITE_OTHER): Payer: Medicare Other | Admitting: Ophthalmology

## 2020-06-25 ENCOUNTER — Emergency Department (HOSPITAL_COMMUNITY)
Admission: EM | Admit: 2020-06-25 | Discharge: 2020-06-25 | Disposition: A | Discharge status: Home / Self Care | Payer: Medicare Other | Attending: Emergency Medicine | Admitting: Emergency Medicine

## 2020-06-25 ENCOUNTER — Emergency Department (HOSPITAL_COMMUNITY): Payer: Medicare Other

## 2020-06-25 ENCOUNTER — Encounter (HOSPITAL_COMMUNITY): Payer: Self-pay | Admitting: Emergency Medicine

## 2020-06-25 DIAGNOSIS — I2510 Atherosclerotic heart disease of native coronary artery without angina pectoris: Secondary | ICD-10-CM | POA: Insufficient documentation

## 2020-06-25 DIAGNOSIS — W19XXXA Unspecified fall, initial encounter: Secondary | ICD-10-CM | POA: Diagnosis not present

## 2020-06-25 DIAGNOSIS — R531 Weakness: Secondary | ICD-10-CM | POA: Diagnosis not present

## 2020-06-25 DIAGNOSIS — Z7901 Long term (current) use of anticoagulants: Secondary | ICD-10-CM | POA: Diagnosis not present

## 2020-06-25 DIAGNOSIS — Z794 Long term (current) use of insulin: Secondary | ICD-10-CM | POA: Insufficient documentation

## 2020-06-25 DIAGNOSIS — R0602 Shortness of breath: Secondary | ICD-10-CM

## 2020-06-25 DIAGNOSIS — N184 Chronic kidney disease, stage 4 (severe): Secondary | ICD-10-CM | POA: Insufficient documentation

## 2020-06-25 DIAGNOSIS — I5033 Acute on chronic diastolic (congestive) heart failure: Secondary | ICD-10-CM | POA: Diagnosis not present

## 2020-06-25 DIAGNOSIS — Z20822 Contact with and (suspected) exposure to covid-19: Secondary | ICD-10-CM | POA: Insufficient documentation

## 2020-06-25 DIAGNOSIS — R0609 Other forms of dyspnea: Secondary | ICD-10-CM | POA: Insufficient documentation

## 2020-06-25 DIAGNOSIS — I130 Hypertensive heart and chronic kidney disease with heart failure and stage 1 through stage 4 chronic kidney disease, or unspecified chronic kidney disease: Secondary | ICD-10-CM | POA: Insufficient documentation

## 2020-06-25 DIAGNOSIS — Z951 Presence of aortocoronary bypass graft: Secondary | ICD-10-CM | POA: Insufficient documentation

## 2020-06-25 DIAGNOSIS — E113292 Type 2 diabetes mellitus with mild nonproliferative diabetic retinopathy without macular edema, left eye: Secondary | ICD-10-CM | POA: Insufficient documentation

## 2020-06-25 LAB — BRAIN NATRIURETIC PEPTIDE: B Natriuretic Peptide: 161.7 pg/mL — ABNORMAL HIGH (ref 0.0–100.0)

## 2020-06-25 LAB — CBC WITH DIFFERENTIAL/PLATELET
Abs Immature Granulocytes: 0.04 10*3/uL (ref 0.00–0.07)
Basophils Absolute: 0.1 10*3/uL (ref 0.0–0.1)
Basophils Relative: 1 %
Eosinophils Absolute: 0.2 10*3/uL (ref 0.0–0.5)
Eosinophils Relative: 2 %
HCT: 45.5 % (ref 39.0–52.0)
Hemoglobin: 14.3 g/dL (ref 13.0–17.0)
Immature Granulocytes: 0 %
Lymphocytes Relative: 28 %
Lymphs Abs: 2.7 10*3/uL (ref 0.7–4.0)
MCH: 27.9 pg (ref 26.0–34.0)
MCHC: 31.4 g/dL (ref 30.0–36.0)
MCV: 88.9 fL (ref 80.0–100.0)
Monocytes Absolute: 0.9 10*3/uL (ref 0.1–1.0)
Monocytes Relative: 10 %
Neutro Abs: 5.7 10*3/uL (ref 1.7–7.7)
Neutrophils Relative %: 59 %
Platelets: 261 10*3/uL (ref 150–400)
RBC: 5.12 MIL/uL (ref 4.22–5.81)
RDW: 14.8 % (ref 11.5–15.5)
WBC: 9.6 10*3/uL (ref 4.0–10.5)
nRBC: 0 % (ref 0.0–0.2)

## 2020-06-25 LAB — BASIC METABOLIC PANEL
Anion gap: 11 (ref 5–15)
BUN: 32 mg/dL — ABNORMAL HIGH (ref 8–23)
CO2: 24 mmol/L (ref 22–32)
Calcium: 9 mg/dL (ref 8.9–10.3)
Chloride: 102 mmol/L (ref 98–111)
Creatinine, Ser: 2.27 mg/dL — ABNORMAL HIGH (ref 0.61–1.24)
GFR, Estimated: 27 mL/min — ABNORMAL LOW (ref >60)
Glucose, Bld: 199 mg/dL — ABNORMAL HIGH (ref 70–99)
Potassium: 4.7 mmol/L (ref 3.5–5.1)
Sodium: 137 mmol/L (ref 135–145)

## 2020-06-25 LAB — TROPONIN I (HIGH SENSITIVITY)
Troponin I (High Sensitivity): 18 ng/L — ABNORMAL HIGH (ref <18)
Troponin I (High Sensitivity): 8 ng/L (ref <18)

## 2020-06-25 LAB — RESPIRATORY PANEL BY RT PCR (FLU A&B, COVID)
Influenza A by PCR: NEGATIVE
Influenza B by PCR: NEGATIVE
SARS Coronavirus 2 by RT PCR: NEGATIVE

## 2020-06-25 NOTE — ED Triage Notes (Signed)
Pt BIB [facility] from home, c/o shortness of breath with exertion x months, worsening today. States he has a pulmonology appointment coming up but is unable to wait. Pt also reports increased weakness and syncope this am resulting in a fall. Pt A&O x 4 at this time, hx CHF/CAD/CKD.

## 2020-06-25 NOTE — ED Provider Notes (Addendum)
[facility] Provider Note   CSN: [id number] Arrival date & time: 06/25/20  1546     History Chief Complaint  [facility] presents with  . Shortness of Breath  . Weakness    [facility] is a 74 y.o. male with history of CAD S/P STEMI and CABG, grade 1 DD,  tachybrady syndrome/PAF, on Eliquis, OSA on CPAP, DM on insulin, HTN, HLD, CKD, frequent falls presents to the ER by EMS from home for evaluation of fall  [facility] states [facility] got up to walk from the den to the refrigerator and felt suddenly shaky all over, and generalized weakness and shortness of breath.  [facility] caught himself and held onto the fridge door and fell onto his knees.  [facility] was able to get back up and walk back to the den and sit down.  [facility] tried to get back up from his chair and symptoms recurred, felt shaky all over and generally weak like [facility] was going to fall down with shortness of breath.  [facility] reports [facility] has had worsening shortness of breath for the last week on exertion.  Over a week ago [facility] used to walk at least 1 mile every day with his cane but has not been able to do this because now [facility] is short of breath when just walking around his home.  Now mostly sitting/laying down for the majority of the day. [facility] called his pulmonologist and was told to come to the ER.  Denies any associated chest pain, palpitations.  No syncope.  No cough, fever. No head trauma.  No recent vomiting, diarrhea. No dysuria.  Compliant with all his medicines including Eliquis.  Denies recent leg swelling.  [facility] wears a CPAP nightly and unsure about PND or orthopnea.   HPI     Past Medical History:  Diagnosis Date  . Acute renal failure (ARF) ([place]) 09/24/2015  . Atrial premature complexes   . CAD (coronary artery disease) of artery bypass graft    Early occlusion of saphenous vein graft to intermediate and marginal branch in February 2007 following bypass grafting   . CAD (coronary artery disease), native coronary artery  cardiologist-- dr Raliegh Ip. nelson   hx NSTEMI 09-24-2015  s/p  CABG x5 on 10-01-2017;  post op STEMI inferolateral wall,  SVG OM1 and SVG OM2 occluded, distal OM occlusion the calpruit, treated medically // Myoview 7/21: no ischemia, EF 65, low risk   . CKD (chronic kidney disease), stage III ([place])   . Elevated troponin   . Erectile dysfunction   . Esophageal reflux   . History of atrial fibrillation    post op CABG 10-02-2015  . History of kidney stones   . History of kidney stones   . History of non-ST elevation myocardial infarction (NSTEMI) 09/24/2015   s/p  CABG x5  . History of ST elevation myocardial infarction (STEMI) 10/22/2015   inferior wall,  post op CABG 10-02-2015  . Hyperlipidemia   . Hypertension   . Kidney stone    h/o  . Left ureteral stone   . Mild atherosclerosis of both carotid arteries   . Nephrolithiasis    per CT bilateral non-obstructive calculi  . Peripheral artery disease    LE Arterial US 01/2019: R PTA and ATA occluded; L ATA occluded  . RBBB (right bundle branch block)   . Renal atrophy, right   . Sleep apnea    wears cpap   . ST elevation myocardial infarction (STEMI) of  inferior wall ([place]) 10/22/2015  . Type 2 diabetes mellitus treated with insulin ([place])    followed by pcp  . 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 Wears glasses     [facility] Active Problem List   Diagnosis Date Noted  . 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 Acute on chronic diastolic CHF (congestive heart failure) ([place]) 11/18/2019  . OSA on CPAP 03/07/2019  . Peripheral artery disease   . CKD (chronic kidney disease) stage 4, GFR 15-29 ml/min ([name]) 11/29/2018  . Hypersomnia with sleep apnea 11/29/2018  . Snoring 11/29/2018  . Intractable vascular headache 11/11/2018  . CAD (coronary artery  disease) of artery bypass graft   . 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 Elevated troponin   . CAD (coronary artery disease), native coronary artery 09/24/2015  . Acute renal failure (ARF) ([place]) 09/24/2015  . 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 Hyperlipidemia, unspecified   . Essential hypertension   . History of kidney stones     Past Surgical History:  Procedure Laterality Date  . APPENDECTOMY  1965  . CARDIAC CATHETERIZATION N/A 09/26/2015   Procedure: Left Heart Cath and Coronary Angiography;  Surgeon: Troy Sine, MD;  Location: [facility];  Service: Cardiovascular;  Laterality: N/A;  . CARDIAC CATHETERIZATION N/A 10/22/2015   Procedure: Left Heart Cath and Coronary Angiography;  Surgeon: Sherren Mocha, MD;  Location: [facility];  Service: Cardiovascular;  Laterality: N/A;  . CATARACT EXTRACTION W/ INTRAOCULAR LENS  IMPLANT, BILATERAL  2017  . COLONOSCOPY    . CORONARY ARTERY BYPASS GRAFT N/A 10/02/2015   Procedure: CORONARY ARTERY BYPASS GRAFTING (CABG) X5 LIMA-LAD; SVG-DIAG; SVG-OM; SVG-PD; SVG-RAMUS TRANSESOPHAGEAL ECHOCARDIOGRAM (TEE) ENDOSCOPIC GREATER SAPHENOUS VEIN HARVEST BILAT LE;  Surgeon: Ivin Poot, MD;  Location: [facility];  Service: Open Heart Surgery;  Laterality: N/A;  . CYSTOSCOPY/URETEROSCOPY/HOLMIUM LASER/STENT PLACEMENT Left 08/10/2018   Procedure: CYSTOSCOPY/URETEROSCOPY/HOLMIUM LASER/STENT PLACEMENT;  Surgeon: Festus Aloe, MD;  Location: [facility];  Service: Urology;  Laterality: Left;  . CYSTOSCOPY/URETEROSCOPY/HOLMIUM LASER/STENT PLACEMENT Left 09/10/2018   Procedure: CYSTOSCOPY/URETEROSCOPY/HOLMIUM LASER/STENT EXCHANGE;  Surgeon: Festus Aloe, MD;  Location: [facility];  Service: Urology;  Laterality: Left;  . LEFT HEART CATHETERIZATION WITH CORONARY ANGIOGRAM N/A 04/13/2014   Procedure: LEFT HEART CATHETERIZATION WITH CORONARY  ANGIOGRAM;  Surgeon: Jacolyn Reedy, MD;  [facility];  Service: Cardiovascular;  Laterality: N/A;  . LEG SURGERY Right age 17   closed reduction leg fracture  . POLYPECTOMY    . TEE WITHOUT CARDIOVERSION N/A 10/02/2015   Procedure: TRANSESOPHAGEAL ECHOCARDIOGRAM (TEE);  Surgeon: Ivin Poot, MD;  Location: [facility];  Service: Open Heart Surgery;  Laterality: N/A;  . URETEROSCOPY WITH HOLMIUM LASER LITHOTRIPSY Bilateral 2004;  2005  dr grapey  @[facility]   . VASECTOMY         Family History  Problem Relation Age of Onset  . Heart attack Mother   . Heart attack Father   . Diabetes Brother   . Pancreatic cancer Brother   . Diabetes Brother   . Colon cancer Neg Hx   . Esophageal cancer Neg Hx   . Prostate cancer Neg Hx   . Rectal cancer Neg Hx   . Stomach cancer Neg Hx   .  Colon polyps Neg Hx     Social History   Tobacco Use  . Smoking status: Never Smoker  . Smokeless tobacco: Never Used  Vaping Use  . Vaping Use: Never used  Substance Use Topics  . Alcohol use: Not Currently  . Drug use: Never    Home Medications Prior to Admission medications   Medication Sig Start Date End Date Taking? Authorizing Provider  apixaban (ELIQUIS) 5 MG TABS tablet Take 1 tablet (5 mg total) by mouth 2 (two) times daily. 04/09/20   [name], [name]  atorvastatin (LIPITOR) 40 MG tablet Take 40 mg by mouth daily.     [provider]  B-D UF III MINI PEN NEEDLES 31G X 5 MM MISC USE AS DIRECTED WITH LANTUS SOLOSTAR 04/02/19   [provider]  busPIRone (BUSPAR) 10 MG tablet Take 1 tablet (10 mg total) by mouth at bedtime. 01/25/20   Lomax, Amy, NP  DULoxetine (CYMBALTA) 30 MG capsule Take 30 mg by mouth at bedtime.    [provider]  DULoxetine (CYMBALTA) 60 MG capsule Take 60 mg by mouth daily.  04/12/18   [provider]  furosemide (LASIX) 20 MG tablet Take 20 mg by mouth 3 (three) times a week.    [provider]  Insulin Glargine  (BASAGLAR KWIKPEN) 100 UNIT/ML SOPN Inject 31 Units into the skin daily.     [provider]  MAGNESIUM-ZINC PO Take 1 tablet by mouth daily.    [provider]  Melatonin 3 MG TABS Take 1 tablet by mouth daily as needed (for sleep).     [provider]  metoprolol succinate (TOPROL-XL) 25 MG 24 hr tablet TAKE 1/2 TABLET BY MOUTH EVERY DAY 03/17/19   Dorothy Spark, MD  Multiple Vitamin (MULTIVITAMIN) tablet Take 1 tablet by mouth daily.    [provider]  nitroGLYCERIN (NITROSTAT) 0.4 MG SL tablet Place 0.4 mg under the tongue every 5 (five) minutes as needed for chest pain.    [provider]  ondansetron (ZOFRAN-ODT) 4 MG disintegrating tablet Take 4 mg by mouth every 8 (eight) hours as needed for nausea or vomiting.    [provider]  [facility] ULTRA test strip FOR USE OF [place] BLOOD ONCE DAILY 03/18/19   [provider]  pantoprazole (PROTONIX) 40 MG tablet TAKE 1 TABLET BY MOUTH EVERY DAY 11/25/19   [name], MD  pioglitazone (ACTOS) 45 MG tablet Take 45 mg by mouth daily.    [provider]    Allergies    Levaquin [levofloxacin], Lisinopril, and Victoza [liraglutide]  Review of Systems   Review of Systems  Respiratory: Positive for shortness of breath.   Neurological: Positive for tremors (shakiness) and weakness (generalized).  Hematological: Bruises/bleeds easily.  All other systems reviewed and are negative.   Physical Exam Updated Vital Signs BP (!) 113/59   Pulse (!) 59   Temp 97.8 F (36.6 C) (Oral)   Resp (!) 21   SpO2 98%   Physical Exam Vitals and nursing note reviewed.  Constitutional:      General: [facility] is not in acute distress.    Appearance: [facility] is well-developed.     Comments: NAD.  HENT:     Head: Normocephalic and atraumatic.     Right Ear: External ear normal.     Left Ear: External ear normal.     Nose: Nose normal.  Eyes:     General: No scleral icterus.  Conjunctiva/sclera: Conjunctivae normal.  Cardiovascular:     Rate and Rhythm: Normal rate. Rhythm irregular.     Heart sounds: Normal heart sounds.     Comments: No leg swelling.  No calf pain. Pulmonary:     Effort: Pulmonary effort is normal.     Breath sounds: Normal breath sounds.  Musculoskeletal:        General: No deformity. Normal range of motion.     Cervical back: Normal range of motion and neck supple.  Skin:    General: Skin is warm and dry.     Capillary Refill: Capillary refill takes less than 2 seconds.  Neurological:     Mental Status: [facility] is alert and oriented to person, place, and time.     Comments:   Mental Status: [facility] is awake, alert, oriented to person, place, year, and situation. [facility] is able to give a clear and coherent history. Speech is fluent and clear without dysarthria or aphasia. No signs of neglect.  Cranial Nerves: I not tested II visual fields full bilaterally. PERRL.   III, IV, VI EOMs intact without ptosis or diplopia  V sensation to light touch intact in all 3 divisions of trigeminal nerve bilaterally  VII facial movements symmetric bilaterally VIII hearing intact to voice/conversation  IX, X no uvula deviation, symmetric rise of soft palate/uvula XI 5/5 SCM and trapezius strength bilaterally  XII tongue protrusion midline, symmetric L/R movements  Motor: Strength 5/5 in upper/lower extremities.   Sensation to light touch intact in face, upper/lower extremities. No pronator drift. No leg drop.  Cerebellar: No ataxia with finger to nose. [facility] needed minimal assistance to stand at side of bed. Felt "jittery" and "shaky".  This Probation officer did not attempt further ambulation. Was able to stand unassisted.   Psychiatric:        Behavior: Behavior normal.        Thought Content: Thought content normal.        Judgment: Judgment normal.     ED Results / Procedures / Treatments   Labs (all labs ordered are listed, but only abnormal results  are displayed) Labs Reviewed  BASIC METABOLIC PANEL - Abnormal; Notable for the following components:      Result Value   Glucose, Bld 199 (*)    BUN 32 (*)    Creatinine, Ser 2.27 (*)    GFR, Estimated 27 (*)    All other components within normal limits  BRAIN NATRIURETIC PEPTIDE - Abnormal; Notable for the following components:   B Natriuretic Peptide 161.7 (*)    All other components within normal limits  TROPONIN I (HIGH SENSITIVITY) - Abnormal; Notable for the following components:   Troponin I (High Sensitivity) 18 (*)    All other components within normal limits  RESPIRATORY PANEL BY RT PCR (FLU A&B, COVID)  CBC WITH DIFFERENTIAL/PLATELET  TROPONIN I (HIGH SENSITIVITY)    EKG EKG Interpretation  Date/Time:  Monday June 25 2020 15:51:52 EDT Ventricular Rate:  64 PR Interval:    QRS Duration: 91 QT Interval:  458 QTC Calculation: 473 R Axis:   22 Text Interpretation: Sinus rhythm Supraventricular bigeminy Low voltage, precordial leads RBBB seen on prior 3/21 has resolved Confirmed by Aletta Edouard [phone]) on 06/25/2020 3:57:20 PM   Radiology DG Chest 2 View  Result Date: 06/25/2020 CLINICAL DATA:  Shortness of breath for several months EXAM: CHEST - 2 VIEW COMPARISON:  03/09/2020 FINDINGS: Cardiac shadow is stable. Postsurgical changes are again seen. The lungs are clear  bilaterally. No bony abnormality is seen. IMPRESSION: No active cardiopulmonary disease. Electronically Signed   By: Inez Catalina M.D.   On: 06/25/2020 17:29    Procedures Procedures (including critical care time)  Medications Ordered in ED Medications - No data to display  ED Course  I have reviewed the triage vital signs and the nursing notes.  Pertinent labs & imaging results that were available during my care of the [facility] were reviewed by me and considered in my medical decision making (see chart for details).  Clinical Course as of Jun 25 2033  Mon Jun 25, 2020  1800 1740 per EMT  "[facility] ambulated in room and hall with SpO2. Sats > 94% while ambulating. [facility] reported feeling SOB while ambulating, denies feeling lightheaded/dizzy. [facility] speaking in full sentences. "   [CG]  1800 IMPRESSION: No active cardiopulmonary disease  DG Chest 2 View [CG]  1800 Sinus rhythm Supraventricular bigeminy Low voltage, precordial leads RBBB seen on prior 3/21 has resolved Confirmed by Aletta Edouard [phone]) on 06/25/2020 3:57:20 PM  EKG 12-Lead [CG]  1800 Troponin I (High Sensitivity)(!): 18 [CG]  1801 Baseline 1.8-2 in 2021   Creatinine(!): 2.27 [CG]  1261 74 year old male here with progressive shortness of breath generalized weakness increased frequency of falls over the course of a few months but more progressively over the past few weeks.  Has a pulmonology appointment in a couple of days but felt his symptoms were too severe.  Satting well on room air.  Lab work-up shows mildly elevated troponin which will need to be trended.  BNP slightly elevated but has been high in the past.  Creatinine slightly up from baseline.  Chest x-ray without any acute findings.  Unclear what causing his symptoms.   [MB]  1837 Influenza B By PCR: NEGATIVE [CG]  1932 Influenza A By PCR: NEGATIVE [CG]  1932 SARS Coronavirus 2 by RT PCR: NEGATIVE [CG]    Clinical Course User Index [CG] Kinnie Feil, [name] [MB] Hayden Rasmussen, MD   MDM Rules/Calculators/A&P                          74 year old male presents to the ER for evaluation of what sounds like chronic exertional dyspnea, worsening in the last week, shakiness, weakness and fall today.  No syncope, head trauma. Well-documented history of frequent falls recently.  Has pulmonology appointment on 10/13.   [facility]'s EMR, triage nursing notes reviewed to assist with history and MDM.  Saw cardiology 9/27 who ordered carotid ultrasounds due to frequent falls. History of tachybradycardia syndrome but [facility] declined holter monitor.  Carotid US with nonobstructive plaques.  Lexiscan nuclear stress test in July 2021 with no evidence of infarct or ischemia.  Cardiac cath February 2017 after MI with diffuse disease, medical therapy recommended.  [facility] arrives with normal vital signs.  Normal neurological exam, no ataxia.  No evidence of hypervolemia.    I have ordered lab work including CBC, BMP, troponin x2, BNP, respiratory panel, EKG and chest x-ray.  ER work-up personally visualized and interpreted, essentially very reassuring.  Minimally elevated creatinine from his baseline.  BNP 161 today but normal chest x-ray without pulmonary edema.  Clinically does not appear to be hypervolemic.  When compared to previous BNP is not significantly elevated.  Troponin 18 trended down to 8.  [facility] never had chest pain with this episode.  Negative respiratory panel.  [facility] re-evaluated several times in the ER. Asymptomatic. No clinical  decline. [facility] ambulated here without hypoxia or assistance.    DDx of fall today includes deconditioning, ?tachybradycardic symptoms.  No events picked up on cardiac monitor today however. Medical work-up today is not suggestive of ACS. Unstable angina considered however not having CP.  No evidence to suggest decompensated heart failure.  Doubt neurological process like stroke, TIA.  Given benign work-up today, will discharge.  [facility] has upcoming appointment with pulmonology in 2 days.  Strict return precautions given.  [facility] has a walker and cane and [facility] was instructed to walk with a device at all times.  [facility] and wife are in agreement with plan of care.  Shared with EDP.  Final Clinical Impression(s) / ED Diagnoses Final diagnoses:  Fall, initial encounter  Generalized weakness  Exertional shortness of breath    Rx / DC Orders ED Discharge Orders    None         Arlean Hopping 06/25/20 2035    Hayden Rasmussen, MD 06/26/20 [id number]

## 2020-06-25 NOTE — ED Notes (Signed)
Patient ambulated in room and hall with SpO2. Sats > 94% while ambulating. Patient reported feeling SOB while ambulating, denies feeling lightheaded/dizzy. Patient speaking in full sentences.

## 2020-06-25 NOTE — Discharge Instructions (Addendum)
You were seen in the ER for a fall, generalized weakness and shortness of breath  ER work-up today was reassuring.  Your oxygen remained greater than 94% with walking here  At this time the cause of your symptoms is unclear.   No need for medical admission to the hospital tonight but you do need close follow-up with your primary care doctor or pulmonologist.  Please go to your appointment on the 13th as scheduled.  Use a cane or a walker at all times when walking at home.  Return to the ER for worsening symptoms, fever, chest pain, passing out episodes, recurrent falls with head injury

## 2020-06-25 NOTE — ED Notes (Signed)
Given bag sandwich and soda.

## 2020-06-26 ENCOUNTER — Encounter (INDEPENDENT_AMBULATORY_CARE_PROVIDER_SITE_OTHER): Payer: Medicare Other | Admitting: Ophthalmology

## 2020-06-27 ENCOUNTER — Ambulatory Visit (INDEPENDENT_AMBULATORY_CARE_PROVIDER_SITE_OTHER): Payer: Medicare Other | Admitting: Critical Care Medicine

## 2020-06-27 ENCOUNTER — Encounter: Payer: Self-pay | Admitting: Critical Care Medicine

## 2020-06-27 ENCOUNTER — Other Ambulatory Visit: Payer: Self-pay

## 2020-06-27 VITALS — BP 108/58 | HR 64 | Temp 97.7°F | Ht 72.0 in | Wt 194.8 lb

## 2020-06-27 DIAGNOSIS — R490 Dysphonia: Secondary | ICD-10-CM | POA: Diagnosis not present

## 2020-06-27 DIAGNOSIS — R0609 Other forms of dyspnea: Secondary | ICD-10-CM

## 2020-06-27 DIAGNOSIS — Z23 Encounter for immunization: Secondary | ICD-10-CM | POA: Diagnosis not present

## 2020-06-27 DIAGNOSIS — R0600 Dyspnea, unspecified: Secondary | ICD-10-CM | POA: Diagnosis not present

## 2020-06-27 NOTE — Patient Instructions (Addendum)
Thank you for visiting Dr. Carlis Abbott at [facility]. We recommend the following: Orders Placed This Encounter  Procedures  . Ambulatory referral to ENT  . Pulmonary function test   Orders Placed This Encounter  Procedures  . Ambulatory referral to ENT    Referral Priority:   Routine    Referral Type:   Consultation    Referral Reason:   Specialty Services Required    Requested Specialty:   Otolaryngology    Number of Visits Requested:   1  . Pulmonary function test    Standing Status:   Future    Standing Expiration Date:   06/27/2021    Order Specific Question:   Where should this test be performed?    Answer:    Pulmonary    Order Specific Question:   Full PFT: includes the following: basic spirometry, spirometry pre & post bronchodilator, diffusion capacity (DLCO), lung volumes    Answer:   Full PFT     Return in about 1 month (around 07/28/2020). with Dr. Halford Chessman (30 minutes visit).    Please do your part to reduce the spread of COVID-19.

## 2020-06-27 NOTE — Progress Notes (Signed)
Synopsis: Referred in October 2021 for SOB by [name], MD  Subjective:   PATIENT ID: Fernando Hess GENDER: male DOB: 19-Apr-1946, MRN: [id number]  Chief Complaint  Patient presents with  . Consult    shortness of breath at rest and with exertion    Mr. Helfman is a 74 y/o gentleman who presents today for evaluation of dyspnea.  He is accompanied today by his wife.  He had a recent ER visit on 10/11 for shortness of breath-note reviewed.  He brings up several complaints today, including shortness of breath that is constant for the past 2 weeks, but is not related to his other complaints of recurrent falls and occasional tremors.  He has been evaluated by cardiology recently due to concern for these falls, but have not found an explanation.  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  These do not correlate with episodes where he has fallen.  Cardiology note reviewed from 06/11/2020. H/o MI in 2017> CABG.  Normal Lexiscan 03/2020. History of tachy-brady syndrome> saw EP on 05/24/20.  His history of shortness of breath is limited.  It is not associated with significant cough, wheezing, sputum production.  It is not associated with chest pain, nausea, diaphoresis.  He had an episode of hives sometime in the past 2 weeks, but this was also not associated with worse breathing.  His shortness of breath is not worse with walking.  It is improved when he is eating or drinking.  It is also better when he uses his CPAP at night.  He relates that he has some throat dryness and hoarseness, which makes his shortness of breath worse.  The shortness of breath as previously episodic over the last year, but he cannot relate how long these episodes lasted or if they were precipitating events for them.  He says he cannot walk well anymore, but this seems to be more due to his weakness and tremors  then dyspnea.  No history of amiodarone use, no pets, including birds.  Formerly worked for the [employer] and had secondhand smoke exposure.  Never smoker.  No known family history of lung disease, both of his parents were smokers.  He has no previous known history of lung disease.  He has had hoarseness since having her bypass surgery to 4 to 5 years ago.  His hoarseness is better with resting, and worse after he has been talking for a while.  Sometimes his voice is down to whisper, which can be associated with worse shortness of breath.  It has gotten worse over the last few years.  He has never been evaluated by ENT for this complaint.  He has a history of OSA managed by [facility] neurology.  He is interested in transferring his sleep apnea care here.    Past Medical History:  Diagnosis Date  . Acute renal failure (ARF) ([place]) 09/24/2015  . Atrial premature complexes   . CAD (coronary artery disease) of artery bypass graft    Early occlusion of saphenous vein graft to intermediate and marginal branch in February 2007 following bypass grafting   . CAD (coronary artery disease), native coronary artery cardiologist-- dr Raliegh Ip. nelson   hx NSTEMI 09-24-2015  s/p  CABG x5 on 10-01-2017;  post op STEMI inferolateral wall,  SVG OM1 and SVG OM2 occluded, distal OM occlusion the calpruit, treated medically // Myoview 7/21: no ischemia, EF 65, low  risk   . CKD (chronic kidney disease), stage III ([place])   . Elevated troponin   . Erectile dysfunction   . Esophageal reflux   . History of atrial fibrillation    post op CABG 10-02-2015  . History of kidney stones   . History of kidney stones   . History of non-ST elevation myocardial infarction (NSTEMI) 09/24/2015   s/p  CABG x5  . History of ST elevation myocardial infarction (STEMI) 10/22/2015   inferior wall,  post op CABG 10-02-2015  . Hyperlipidemia   . Hypertension   . Kidney stone    h/o  . Left ureteral stone   . Mild atherosclerosis of  both carotid arteries   . Nephrolithiasis    per CT bilateral non-obstructive calculi  . OSA (obstructive sleep apnea)   . Peripheral artery disease    LE Arterial US 01/2019: R PTA and ATA occluded; L ATA occluded  . RBBB (right bundle branch block)   . Renal atrophy, right   . Sleep apnea    wears cpap   . ST elevation myocardial infarction (STEMI) of inferior wall ([place]) 10/22/2015  . Type 2 diabetes mellitus treated with insulin ([place])    followed by pcp  . 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 Wears glasses      Family History  Problem Relation Age of Onset  . Heart attack Mother   . Heart attack Father   . Diabetes Brother   . Pancreatic cancer Brother   . Diabetes Brother   . Colon cancer Neg Hx   . Esophageal cancer Neg Hx   . Prostate cancer Neg Hx   . Rectal cancer Neg Hx   . Stomach cancer Neg Hx   . Colon polyps Neg Hx      Past Surgical History:  Procedure Laterality Date  . APPENDECTOMY  1965  . CARDIAC CATHETERIZATION N/A 09/26/2015   Procedure: Left Heart Cath and Coronary Angiography;  Surgeon: Troy Sine, MD;  Location: [place] [facility];  Service: Cardiovascular;  Laterality: N/A;  . CARDIAC CATHETERIZATION N/A 10/22/2015   Procedure: Left Heart Cath and Coronary Angiography;  Surgeon: Sherren Mocha, MD;  Location: [place] [facility];  Service: Cardiovascular;  Laterality: N/A;  . CATARACT EXTRACTION W/ INTRAOCULAR LENS  IMPLANT, BILATERAL  2017  . COLONOSCOPY    . CORONARY ARTERY BYPASS GRAFT N/A 10/02/2015   Procedure: CORONARY ARTERY BYPASS GRAFTING (CABG) X5 LIMA-LAD; SVG-DIAG; SVG-OM; SVG-PD; SVG-RAMUS TRANSESOPHAGEAL ECHOCARDIOGRAM (TEE) ENDOSCOPIC GREATER SAPHENOUS VEIN HARVEST BILAT LE;  Surgeon: Ivin Poot, MD;  Location: [place];  Service: Open Heart Surgery;  Laterality: N/A;  . CYSTOSCOPY/URETEROSCOPY/HOLMIUM LASER/STENT PLACEMENT Left 08/10/2018    Procedure: CYSTOSCOPY/URETEROSCOPY/HOLMIUM LASER/STENT PLACEMENT;  Surgeon: Festus Aloe, MD;  Location: [facility];  Service: Urology;  Laterality: Left;  . CYSTOSCOPY/URETEROSCOPY/HOLMIUM LASER/STENT PLACEMENT Left 09/10/2018   Procedure: CYSTOSCOPY/URETEROSCOPY/HOLMIUM LASER/STENT EXCHANGE;  Surgeon: Festus Aloe, MD;  Location: [facility];  Service: Urology;  Laterality: Left;  . LEFT HEART CATHETERIZATION WITH CORONARY ANGIOGRAM N/A 04/13/2014   Procedure: LEFT HEART CATHETERIZATION WITH CORONARY ANGIOGRAM;  Surgeon: Jacolyn Reedy, MD;  Location: [facility] CATH LAB;  Service: Cardiovascular;  Laterality: N/A;  . LEG SURGERY Right age 55   closed reduction leg fracture  . POLYPECTOMY    . TEE WITHOUT CARDIOVERSION N/A 10/02/2015   Procedure: TRANSESOPHAGEAL ECHOCARDIOGRAM (TEE);  Surgeon: Ivin Poot, MD;  Location: [facility]  OR;  Service: Open Heart Surgery;  Laterality: N/A;  . URETEROSCOPY WITH HOLMIUM LASER LITHOTRIPSY Bilateral 2004;  2005  dr grapey  @[email or web address]   . VASECTOMY      Social History   Socioeconomic History  . Marital status: Married    Spouse name: Not on file  . Number of children: Not on file  . Years of education: Not on file  . Highest education level: Not on file  Occupational History  . Occupation: retired  Tobacco Use  . Smoking status: Never Smoker  . Smokeless tobacco: Never Used  Vaping Use  . Vaping Use: Never used  Substance and Sexual Activity  . Alcohol use: Not Currently  . Drug use: Never  . Sexual activity: Not on file  Other Topics Concern  . 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 Difficulty of Paying Living Expenses: Not on file  Food Insecurity:   . Worried About Charity fundraiser in the Last Year: Not on file  . Ran Out of Food in the Last Year: Not on file  Transportation Needs:   . Lack of  Transportation (Medical): Not on file  . Lack of Transportation (Non-Medical): Not on file  Physical Activity:   . Days of Exercise per Week: Not on file  . Minutes of Exercise per Session: Not on file  Stress:   . Feeling of Stress : Not on file  Social Connections:   . Frequency of Communication with Friends and Family: Not on file  . Frequency of Social Gatherings with Friends and Family: Not on file  . Attends Religious Services: Not on file  . Active Member of Clubs or Organizations: Not on file  . Attends Archivist Meetings: Not on file  . Marital Status: Not on file  Intimate Partner Violence:   . Fear of Current or Ex-Partner: Not on file  . Emotionally Abused: Not on file  . Physically Abused: Not on file  . Sexually Abused: Not on file     Allergies  Allergen Reactions  . Levaquin [Levofloxacin] Itching and Rash  . Lisinopril Itching and Rash  . Victoza [Liraglutide]     Severe fatigue & insomnia     Immunization History  Administered Date(s) Administered  . Fluad Quad(high Dose 65+) 06/27/2020  . Moderna SARS-COVID-2 Vaccination 10/20/2019, 11/10/2019  . Tdap 11/01/2015    Outpatient Medications Prior to Visit  Medication Sig Dispense Refill  . apixaban (ELIQUIS) 5 MG TABS tablet Take 1 tablet (5 mg total) by mouth 2 (two) times daily. 60 tablet 3  . atorvastatin (LIPITOR) 40 MG tablet Take 40 mg by mouth daily.     . B-D UF III MINI PEN NEEDLES 31G X 5 MM MISC USE AS DIRECTED WITH LANTUS SOLOSTAR    . busPIRone (BUSPAR) 10 MG tablet Take 1 tablet (10 mg total) by mouth at bedtime. 90 tablet 2  . DULoxetine (CYMBALTA) 30 MG capsule Take 30 mg by mouth at bedtime.    . DULoxetine (CYMBALTA) 60 MG capsule Take 60 mg by mouth daily.   4  . furosemide (LASIX) 20 MG tablet Take 20 mg by mouth 3 (three) times a week.    . Insulin Glargine (BASAGLAR KWIKPEN) 100 UNIT/ML SOPN Inject 31 Units into the skin daily.     [name] MAGNESIUM-ZINC  PO Take 1 tablet by  mouth daily.    . Melatonin 3 MG TABS Take 1 tablet by mouth daily as needed (for sleep).     . metoprolol succinate (TOPROL-XL) 25 MG 24 hr tablet TAKE 1/2 TABLET BY MOUTH EVERY DAY 45 tablet 1  . Multiple Vitamin (MULTIVITAMIN) tablet Take 1 tablet by mouth daily.    . nitroGLYCERIN (NITROSTAT) 0.4 MG SL tablet Place 0.4 mg under the tongue every 5 (five) minutes as needed for chest pain.    [name] ondansetron (ZOFRAN-ODT) 4 MG disintegrating tablet Take 4 mg by mouth every 8 (eight) hours as needed for nausea or vomiting.    Glory Rosebush ULTRA test strip FOR USE OF CHEACKING BLOOD ONCE DAILY    . pantoprazole (PROTONIX) 40 MG tablet TAKE 1 TABLET BY MOUTH EVERY DAY 90 tablet 2  . pioglitazone (ACTOS) 45 MG tablet Take 45 mg by mouth daily.     No facility-administered medications prior to visit.    Review of Systems  Constitutional: Negative for chills and fever.  HENT: Positive for sore throat.        Hoarseness  Respiratory: Positive for cough and shortness of breath. Negative for wheezing.   Cardiovascular: Negative for chest pain and leg swelling.  Gastrointestinal: Negative for heartburn, nausea and vomiting.  Musculoskeletal: Positive for falls. Negative for joint pain.  Skin: Negative for rash.  Neurological: Positive for tremors and weakness.  Endo/Heme/Allergies: Negative for environmental allergies. Does not bruise/bleed easily.     Objective:   Vitals:   06/27/20 1347  BP: (!) 108/58  Pulse: 64  Temp: 97.7 F (36.5 C)  TempSrc: Oral  SpO2: 96%  Weight: 194 lb 12.8 oz (88.4 kg)  Height: 6' (1.829 m)   96% on  RA BMI Readings from Last 3 Encounters:  06/27/20 26.42 kg/m  06/11/20 32.64 kg/m  05/24/20 32.86 kg/m   Wt Readings from Last 3 Encounters:  06/27/20 194 lb 12.8 oz (88.4 kg)  06/11/20 202 lb 3.2 oz (91.7 kg)  05/24/20 203 lb 9.6 oz (92.4 kg)    Physical Exam Vitals reviewed.  Constitutional:      General: He is not in acute distress.     Appearance: He is obese. He is not ill-appearing.  HENT:     Head: Normocephalic and atraumatic.     Mouth/Throat:     Comments: Some hoarseness, quiet voice Eyes:     General: No scleral icterus. Cardiovascular:     Rate and Rhythm: Normal rate. Rhythm irregular.     Heart sounds: No murmur heard.   Pulmonary:     Comments: Breathing comfortably on room air, no conversational dyspnea.  Clear to auscultation bilaterally. Abdominal:     General: There is no distension.     Palpations: Abdomen is soft.     Tenderness: There is no abdominal tenderness.  Musculoskeletal:        General: No swelling or deformity.     Cervical back: Neck supple.  Lymphadenopathy:     Cervical: No cervical adenopathy.  Skin:    General: Skin is warm and dry.     Findings: No rash.  Neurological:     Mental Status: He is alert.     Coordination: Coordination normal.      CBC    Component Value Date/Time   WBC 9.6 06/25/2020 1634   RBC 5.12 06/25/2020 1634   HGB 14.3 06/25/2020 1634   HGB 13.2 05/22/2020 0000   HCT 45.5  06/25/2020 1634   HCT 41.0 05/22/2020 0000   PLT 261 06/25/2020 1634   PLT 277 05/22/2020 0000   MCV 88.9 06/25/2020 1634   MCV 88 05/22/2020 0000   MCH 27.9 06/25/2020 1634   MCHC 31.4 06/25/2020 1634   RDW 14.8 06/25/2020 1634   RDW 13.2 05/22/2020 0000   LYMPHSABS 2.7 06/25/2020 1634   MONOABS 0.9 06/25/2020 1634   EOSABS 0.2 06/25/2020 1634   BASOSABS 0.1 06/25/2020 1634    CHEMISTRY Recent Labs  Lab 06/25/20 1634  NA 137  K 4.7  CL 102  CO2 24  GLUCOSE 199*  BUN 32*  CREATININE 2.27*  CALCIUM 9.0   Estimated Creatinine Clearance: 30.4 mL/min (A) (by C-G formula based on SCr of 2.27 mg/dL (H)).   Chest Imaging- films reviewed: CXR, 2 view 06/25/2020-no opacities or masses.  Poststernotomy hardware in place.  Pulmonary Functions Testing Results: PFT Results Latest Ref Rng & Units 10/01/2015  FVC-Pre L 2.61  FVC-Predicted Pre % 70  FVC-Post L 2.89   FVC-Predicted Post % 77  Pre FEV1/FVC % % 71  Post FEV1/FCV % % 21  FEV1-Pre L 1.85  FEV1-Predicted Pre % 68  FEV1-Post L 0.61  DLCO uncorrected ml/min/mmHg 10.34  DLCO UNC% % 38  DLVA Predicted % 93  TLC L 4.54  TLC % Predicted % 73  RV % Predicted % 102    2017- No significant obstruction.  Mild restriction.  Severely impaired diffusion capacity.  No significant response to bronchodilators. Flow volume loops uninterpretable.  Echocardiogram 11/19/2019: LVEF 60 to 65%.  Moderate LVH with grade 1 diastolic dysfunction..  Normal LA, RV, RA.  Mild AR, mild to moderate aortic sclerosis without stenosis.  Myocardial perfusion imaging 03/20/2020: LVEF 55 to 65%, no ST segment or T wave changes during stress.  Low risk study.    Assessment & Plan:     ICD-10-CM   1. DOE (dyspnea on exertion)  R06.00 Pulmonary function test  2. Hoarseness  R49.0 Ambulatory referral to ENT  3. 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  Concern for vocal cord dysfunction.  This could explain shortness of breath, but I would expect it to be more associated with activity. -ENT referral for laryngoscopy -PFTs  Dyspnea on exertion; does not seem to be associated with falls and tremors thankfully -PFTs  Sleep apnea on CPAP -Continue CPAP as currently prescribed  Flu shot today. UTD on covid vaccine.  Follow up in 1 month with Dr. Halford Chessman.     Current Outpatient Medications:  .  apixaban (ELIQUIS) 5 MG TABS tablet, Take 1 tablet (5 mg total) by mouth 2 (two) times daily., Disp: 60 tablet, Rfl: 3 .  atorvastatin (LIPITOR) 40 MG tablet, Take 40 mg by mouth daily. , Disp: , Rfl:  .  B-D UF III MINI PEN NEEDLES 31G X 5 MM MISC, USE AS DIRECTED WITH LANTUS SOLOSTAR, Disp: , Rfl:  .  busPIRone (BUSPAR) 10 MG tablet, Take 1 tablet (10 mg total) by mouth at bedtime., Disp: 90 tablet, Rfl: 2 .   DULoxetine (CYMBALTA) 30 MG capsule, Take 30 mg by mouth at bedtime., Disp: , Rfl:  .  DULoxetine (CYMBALTA) 60 MG capsule, Take 60 mg by mouth daily. , Disp: , Rfl: 4 .  furosemide (LASIX) 20 MG tablet, Take 20 mg by mouth 3 (three) times a week., Disp: , Rfl:  .  Insulin Glargine (BASAGLAR KWIKPEN) 100 UNIT/ML SOPN, Inject 31 Units into the skin daily. , Disp: , Rfl:  .  MAGNESIUM-ZINC PO, Take 1 tablet by mouth daily., Disp: , Rfl:  .  Melatonin 3 MG TABS, Take 1 tablet by mouth daily as needed (for sleep). , Disp: , Rfl:  .  metoprolol succinate (TOPROL-XL) 25 MG 24 hr tablet, TAKE 1/2 TABLET BY MOUTH EVERY DAY, Disp: 45 tablet, Rfl: 1 .  Multiple Vitamin (MULTIVITAMIN) tablet, Take 1 tablet by mouth daily., Disp: , Rfl:  .  nitroGLYCERIN (NITROSTAT) 0.4 MG SL tablet, Place 0.4 mg under the tongue every 5 (five) minutes as needed for chest pain., Disp: , Rfl:  .  ondansetron (ZOFRAN-ODT) 4 MG disintegrating tablet, Take 4 mg by mouth every 8 (eight) hours as needed for nausea or vomiting., Disp: , Rfl:  .  ONETOUCH ULTRA test strip, FOR USE OF CHEACKING BLOOD ONCE DAILY, Disp: , Rfl:  .  pantoprazole (PROTONIX) 40 MG tablet, TAKE 1 TABLET BY MOUTH EVERY DAY, Disp: 90 tablet, Rfl: 2 .  pioglitazone (ACTOS) 45 MG tablet, Take 45 mg by mouth daily., Disp: , Rfl:     Julian Hy, DO [facility] 06/27/2020 3:41 PM

## 2020-06-29 ENCOUNTER — Telehealth: Payer: Self-pay | Admitting: Nurse Practitioner

## 2020-06-29 NOTE — Telephone Encounter (Signed)
Fernando Hess is a 74 y.o. male with multiple medical problems not limited to colon polyps, GERD, history of CAD S/P STEMI and CABG, grade 1 DD,  tachybrady syndrome/PAF, on Eliquis, OSA on CPAP, DM on insulin, HTN, HLD, CKD.   Patient last seen at our office March 2021 with bowel changes   Last colonoscopy ( complete with prep adequate to detect polyps) was Nov 2020 with findings of a few tiny polyps, a few diverticula.   Patient's wife called answering service today. Patient unable to hold down anything, including water since last night. He has ODT Zofran but says he can't hold it down.   He was seen in ED on Monday but that was for a fall and worsening dyspnea.  Records reviewed.  CBC normal. He had AKi on CKD. Trop elevated but trended down and he had no chest pain. ACS not suspected. No imaging done other than a CXR ( no acute finding).   Cause of N/V unclear, ? Maybe viral. Appears he was not sent home from ED with any new meds to cause symptoms.He had loose Bm yesterday after trying to drink Ensure  Recommendations:  --Has appt with [place] 10/27. Wife can call office Monday morning to see if there is sooner appt available --Offered to call in anti-emetic suppository but he declined --Try and sip on liquids. If continues to vomiting then should go to ED.

## 2020-07-11 ENCOUNTER — Encounter: Payer: Self-pay | Admitting: Internal Medicine

## 2020-07-11 ENCOUNTER — Ambulatory Visit (INDEPENDENT_AMBULATORY_CARE_PROVIDER_SITE_OTHER): Payer: Medicare Other | Admitting: Internal Medicine

## 2020-07-11 VITALS — BP 110/70 | HR 70 | Ht 64.75 in | Wt 196.0 lb

## 2020-07-11 DIAGNOSIS — R1310 Dysphagia, unspecified: Secondary | ICD-10-CM

## 2020-07-11 DIAGNOSIS — K219 Gastro-esophageal reflux disease without esophagitis: Secondary | ICD-10-CM | POA: Diagnosis not present

## 2020-07-11 DIAGNOSIS — R194 Change in bowel habit: Secondary | ICD-10-CM

## 2020-07-11 NOTE — Progress Notes (Signed)
HISTORY OF PRESENT ILLNESS:  Fernando Hess is a 74 y.o. male with multiple medical problems as listed below who presents with a myriad of GI and non-GI complaints.  He is accompanied by his wife.  States he does not feel well.  Describes low energy.  Weak voice.  Some problems with shortness of breath.  Seen in the emergency room recently regarding a fall.  Negative work-up.  Contacted the on-call advanced practitioner June 29, 2020.  At that point complaining "cannot keep anything down" per his wife.  Seen earlier this year regarding change in bowel habits.  Tells me now that his bowel habits are formed and occur once every other day.  He is not on fiber as recommended.  Prefers pills.  Describes problems with regurgitation.  Also describes some dysphagia to pills.  Points to the proximal esophagus.  He did undergo upper endoscopy with esophageal dilation May 31, 2019 for complaints of dysphagia.  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  Also gastroduodenitis.  He was prescribed pantoprazole 40 mg daily which he has continued.  Recent blood work from June 25, 2020 shows normal CBC with hemoglobin 14.3.  Chronic renal insufficiency with creatinine 2.27.  CT scan of the abdomen pelvis November 2019 to evaluate lower abdominal pain revealed left kidney stone.  His last complete colonoscopy was performed June 2020.  Diminutive polyps and diverticulosis.  Follow-up 5 years recommended.  He has completed his Covid vaccination series  REVIEW OF SYSTEMS:  All non-GI ROS negative unless otherwise stated in the HPI except for headaches, heart rhythm change, cough, depression, fatigue, sleeping problems, dry throat, voice change, shortness of breath  Past Medical History:  Diagnosis Date   Acute renal failure (ARF) ([place]) 09/24/2015   Atrial premature complexes    CAD (coronary artery disease) of artery bypass graft    Early occlusion of saphenous vein graft to  intermediate and marginal branch in February 2007 following bypass grafting    CAD (coronary artery disease), native coronary artery cardiologist-- dr Liane Comber   hx NSTEMI 09-24-2015  s/p  CABG x5 on 10-01-2017;  post op STEMI inferolateral wall,  SVG OM1 and SVG OM2 occluded, distal OM occlusion the calpruit, treated medically // Myoview 7/21: no ischemia, EF 65, low risk    CKD (chronic kidney disease), stage III ([name])    Elevated troponin    Erectile dysfunction    Esophageal reflux    History of atrial fibrillation    post op CABG 10-02-2015   History of kidney stones    History of kidney stones    History of non-ST elevation myocardial infarction (NSTEMI) 09/24/2015   s/p  CABG x5   History of ST elevation myocardial infarction (STEMI) 10/22/2015   inferior wall,  post op CABG 10-02-2015   Hyperlipidemia    Hypertension    Kidney stone    h/o   Left ureteral stone    Mild atherosclerosis of both carotid arteries    Nephrolithiasis    per CT bilateral non-obstructive calculi   OSA (obstructive sleep apnea)    Peripheral artery disease    LE Arterial US 01/2019: R PTA and ATA occluded; L ATA occluded   RBBB (right bundle branch block)    Renal atrophy, right    Sleep apnea    wears cpap    ST elevation myocardial infarction (STEMI) of inferior wall ([place]) 10/22/2015   Type 2 diabetes mellitus  treated with insulin ([place])    followed by pcp   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 ([place]) 03/01/2008   Wears glasses     Past Surgical History:  Procedure Laterality Date   [place] N/A 09/26/2015   Procedure: Left Heart Cath and Coronary Angiography;  Surgeon: Troy Sine, MD;  Location: [facility];  Service: Cardiovascular;  Laterality: N/A;   CARDIAC CATHETERIZATION N/A 10/22/2015   Procedure: Left Heart Cath and Coronary Angiography;   Surgeon: Sherren Mocha, MD;  Location: [facility];  Service: Cardiovascular;  Laterality: N/A;   CATARACT EXTRACTION W/ INTRAOCULAR LENS  IMPLANT, BILATERAL  2017   COLONOSCOPY     CORONARY ARTERY BYPASS GRAFT N/A 10/02/2015   Procedure: CORONARY ARTERY BYPASS GRAFTING (CABG) X5 [place]-LAD; SVG-DIAG; SVG-OM; SVG-PD; SVG-RAMUS TRANSESOPHAGEAL ECHOCARDIOGRAM (TEE) ENDOSCOPIC GREATER SAPHENOUS VEIN HARVEST BILAT LE;  Surgeon: Ivin Poot, MD;  Location: [facility];  Service: Open Heart Surgery;  Laterality: N/A;   CYSTOSCOPY/URETEROSCOPY/HOLMIUM LASER/STENT PLACEMENT Left 08/10/2018   Procedure: CYSTOSCOPY/URETEROSCOPY/HOLMIUM LASER/STENT PLACEMENT;  Surgeon: Festus Aloe, MD;  Location: [facility];  Service: Urology;  Laterality: Left;   CYSTOSCOPY/URETEROSCOPY/HOLMIUM LASER/STENT PLACEMENT Left 09/10/2018   Procedure: CYSTOSCOPY/URETEROSCOPY/HOLMIUM LASER/STENT EXCHANGE;  Surgeon: Festus Aloe, MD;  Location: [facility];  Service: Urology;  Laterality: Left;   LEFT HEART CATHETERIZATION WITH CORONARY ANGIOGRAM N/A 04/13/2014   Procedure: LEFT HEART CATHETERIZATION WITH CORONARY ANGIOGRAM;  Surgeon: Jacolyn Reedy, MD;  Location: [facility];  Service: Cardiovascular;  Laterality: N/A;   LEG SURGERY Right age 38   closed reduction leg fracture   POLYPECTOMY     TEE WITHOUT CARDIOVERSION N/A 10/02/2015   Procedure: TRANSESOPHAGEAL ECHOCARDIOGRAM (TEE);  Surgeon: Ivin Poot, MD;  Location: [place];  Service: Open Heart Surgery;  Laterality: N/A;   URETEROSCOPY WITH HOLMIUM LASER LITHOTRIPSY Bilateral 2004;  2005  dr grapey  '@[facility]'    VASECTOMY      Social History JUEL BELLEROSE  reports that he has never smoked. He has never used smokeless tobacco. He reports previous alcohol use. He reports that he does not use drugs.  family history includes Diabetes in his brother and brother; Heart attack in his father and mother; Pancreatic cancer in his  brother.  Allergies  Allergen Reactions   Levaquin [Levofloxacin] Itching and Rash   Lisinopril Itching and Rash   Victoza [Liraglutide]     Severe fatigue & insomnia       PHYSICAL EXAMINATION: Vital signs: BP 110/70    Pulse 70    Ht 5' 4.75" (1.645 m)    Wt 196 lb (88.9 kg)    SpO2 98%    BMI 32.87 kg/m   Constitutional: generally well-appearing, no acute distress Psychiatric: alert and oriented x3, cooperative, flat affect Eyes: extraocular movements intact, anicteric, conjunctiva pink Mouth: Mask Neck: supple no lymphadenopathy Cardiovascular: heart regular rate and rhythm, no murmur Lungs: clear to auscultation bilaterally Abdomen: soft, nontender, nondistended, no obvious ascites, no peritoneal signs, normal bowel sounds, no organomegaly Rectal: Omitted Extremities: no lower extremity edema bilaterally Skin: no lesions on visible extremities Neuro: No focal deficits.  Cranial nerves intact  ASSESSMENT:  1.  Recurrent dysphagia.  Admit date by history.  Previous esophageal web dilated just 1 year ago (52 [place] dilator).  Rule out recurrent stricture.  Rule out dysmotility. 2.  GERD.  On pantoprazole daily 3.  History  of atrial arrhythmia.  On chronic Eliquis 4.  History of colon polyps.  Multiple prior colonoscopies.  Surveillance up-to-date.  Last examination June 2020 5.  Multiple non-GI complaints.   PLAN:  1.  Continue pantoprazole 2.  Schedule barium swallow tablets further evaluate dysphagia 3.  Fiber tablets to keep bowels regulated 4.  Reflux precautions 5.  Surveillance colonoscopy around June 2020 6.  Additional recommendations after the above completed.  Otherwise GI follow-up for routine management of chronic GI problems in 1 year

## 2020-07-11 NOTE — Patient Instructions (Signed)
You have been scheduled for a Barium Esophogram at [facility] (1st floor of the hospital) on 07/25/2020 at 9:30am. Please arrive 15 minutes prior to your appointment for registration. Make certain not to have anything to eat or drink 3 hours prior to your test. If you need to reschedule for any reason, please contact radiology at [phone] to do so. __________________________________________________________________ A barium swallow is an examination that concentrates on views of the esophagus. This tends to be a double contrast exam (barium and two liquids which, when combined, create a gas to distend the wall of the oesophagus) or single contrast (non-ionic iodine based). The study is usually tailored to your symptoms so a good history is essential. Attention is paid during the study to the form, structure and configuration of the esophagus, looking for functional disorders (such as aspiration, dysphagia, achalasia, motility and reflux) EXAMINATION You may be asked to change into a gown, depending on the type of swallow being performed. A radiologist and radiographer will perform the procedure. The radiologist will advise you of the type of contrast selected for your procedure and direct you during the exam. You will be asked to stand, sit or lie in several different positions and to hold a small amount of fluid in your mouth before being asked to swallow while the imaging is performed .In some instances you may be asked to swallow barium coated marshmallows to assess the motility of a solid food bolus. The exam can be recorded as a digital or video fluoroscopy procedure. POST PROCEDURE It will take 1-2 days for the barium to pass through your system. To facilitate this, it is important, unless otherwise directed, to increase your fluids for the next 24-48hrs and to resume your normal diet.  This test typically takes about 30 minutes to  perform. __________________________________________________________________________________

## 2020-07-13 ENCOUNTER — Ambulatory Visit: Payer: Medicare Other | Admitting: Nurse Practitioner

## 2020-07-16 ENCOUNTER — Encounter (INDEPENDENT_AMBULATORY_CARE_PROVIDER_SITE_OTHER): Payer: Self-pay | Admitting: Ophthalmology

## 2020-07-16 ENCOUNTER — Ambulatory Visit (INDEPENDENT_AMBULATORY_CARE_PROVIDER_SITE_OTHER): Payer: Medicare Other | Admitting: Ophthalmology

## 2020-07-16 ENCOUNTER — Other Ambulatory Visit: Payer: Self-pay

## 2020-07-16 DIAGNOSIS — E113311 Type 2 diabetes mellitus with moderate nonproliferative diabetic retinopathy with macular edema, right eye: Secondary | ICD-10-CM | POA: Diagnosis not present

## 2020-07-16 DIAGNOSIS — Z794 Long term (current) use of insulin: Secondary | ICD-10-CM

## 2020-07-16 DIAGNOSIS — D3131 Benign neoplasm of right choroid: Secondary | ICD-10-CM

## 2020-07-16 DIAGNOSIS — E113392 Type 2 diabetes mellitus with moderate nonproliferative diabetic retinopathy without macular edema, left eye: Secondary | ICD-10-CM | POA: Diagnosis not present

## 2020-07-16 NOTE — Assessment & Plan Note (Signed)
Recurrent CSME, new location OD, superotemporal macula, not center involved.  Will require and recommend focal laser photocoagulation.  Risk and benefits reviewed.

## 2020-07-16 NOTE — Assessment & Plan Note (Signed)
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

## 2020-07-16 NOTE — Assessment & Plan Note (Signed)
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

## 2020-07-16 NOTE — Patient Instructions (Signed)
Diabetic Retinopathy Diabetic retinopathy is a disease of the retina. The retina is a light-sensitive membrane at the back of the eye. Retinopathy is a complication of diabetes (diabetes mellitus) and a common cause of bad eyesight (visual impairment). It can eventually cause blindness. Early detection and treatment of diabetic retinopathy is important in keeping your eyes healthy and preventing further damage to them. What are the causes? Diabetic retinopathy is caused by blood sugar (glucose) levels that are too high for an extended period of time. High blood glucose over an extended period of time can:  Damage small blood vessels in the retina, allowing blood to leak through the vessel walls.  Cause new, abnormal blood vessels to grow on the retina. This can scar the retina in the advanced stage of diabetic retinopathy. What increases the risk? You are more likely to develop this condition if:  You have had diabetes for a long time.  You have poorly controlled blood glucose.  You have high blood pressure. What are the signs or symptoms? In the early stages of diabetic retinopathy, there are often no symptoms. As the condition gets worse, symptoms may include:  Blurred vision. This is usually caused by swelling due to abnormal blood glucose levels. The blurriness may go away when blood glucose levels return to normal.  Moving specks or dark spots (floaters) in your vision. These can be caused by a small amount of bleeding (hemorrhage) from retinal blood vessels.  Missing parts of your field of vision, such as vision at the sides of the eyes. This can be caused by larger retinal hemorrhages.  Difficulty reading.  Double vision.  Pain in one or both eyes.  Feeling pressure in one or both eyes.  Trouble seeing straight lines. Straight lines may not look straight.  Redness of the eyes that does not go away. How is this diagnosed? This condition may be diagnosed with an eye exam in  which your eye care specialist puts drops in your eyes that enlarge (dilate) your pupils. This lets your health care provider examine your retina and check for changes in your retinal blood vessels. How is this treated? This condition may be treated by:  Keeping your blood glucose and blood pressure within a target range.  Using a type of laser beam to seal your retinal blood vessels. This stops them from bleeding and decreases pressure in your eye.  Getting shots of medicine in the eye to reduce swelling of the center of the retina (macula). You may be given: ? Anti-VEGF medicine. This medicine can help slow vision loss, and may even improve vision. ? Steroid medicine. Follow these instructions at home:   Follow your diabetes management plan as directed by your health care provider. This may include exercising regularly and eating a healthy diet.  Keep your blood glucose level and your blood pressure in your target range, as directed by your health care provider.  Check your blood glucose as often as directed.  Take over the counter and prescription medicines only as told by your health care provider. This includes insulin and oral diabetes medicine.  Get your eyes checked at least once every year. An eye specialist can usually see diabetic retinopathy developing long before it starts to cause problems. In many cases, it can be treated to prevent complications from occurring.  Do not use any products that contain nicotine or tobacco, such as cigarettes and e-cigarettes. If you need help quitting, ask your health care provider.  Keep all follow-up   visits as told by your health care provider. This is important. Contact a health care provider if:  You notice gradual blurring or other changes in your vision over time.  You notice that your glasses or contact lenses do not make things look as sharp as they once did.  You have trouble reading or seeing details at a distance with either  eye.  You notice a change in your vision or notice that parts of your field of vision appear missing or hazy.  You suddenly see moving specks or dark spots in the field of vision of either eye. Get help right away if:  You have sudden pain or pressure in one or both eyes.  You suddenly lose vision or a curtain or veil seems to come across your eyes.  You have a sudden burst of floaters in your vision. Summary  Diabetic retinopathy is a disease of the retina. The retina is a light-sensitive membrane at the back of the eye. Retinopathy is a complication of diabetes.  Get your eyes checked at least once every year. An eye specialist can usually see diabetic retinopathy developing long before it starts to cause problems. In many cases, it can be treated to prevent complications from occurring.  Keep your blood glucose and your blood pressure in target range. Follow your diabetes management plan as directed by your health care provider.  Protect your eyes. Wear sunglasses and eye protection when needed. This information is not intended to replace advice given to you by your health care provider. Make sure you discuss any questions you have with your health care provider. Document Revised: 10/14/2017 Document Reviewed: 10/06/2016 Elsevier Patient Education  2020 [employer]  

## 2020-07-16 NOTE — Progress Notes (Signed)
07/16/2020     CHIEF COMPLAINT Patient presents for Retina Follow Up   HISTORY OF PRESENT ILLNESS: Fernando Hess is a 74 y.o. male who presents to the clinic today for:   HPI    Retina Follow Up    Patient presents with  Diabetic Retinopathy.  In right eye.  Severity is moderate.  Duration of 5 months.  Since onset it is stable.  I, the attending physician,  performed the HPI with the patient and updated documentation appropriately.          Comments    5 Month f\u OU. OC and FP  Pt states vision has been stable. Denies any changes. Pt still using CPAP BGL: 122 A1C: 11.1       Last edited by [name] on 07/16/2020  8:22 AM. (History)      Referring physician: [name], MD [place],  [place] [id number]  HISTORICAL INFORMATION:   Selected notes from the MEDICAL RECORD NUMBER    Lab Results  Component Value Date   HGBA1C 11.1 (H) 06/11/2020     CURRENT MEDICATIONS: No current outpatient medications on file. (Ophthalmic Drugs)   No current facility-administered medications for this visit. (Ophthalmic Drugs)   Current Outpatient Medications (Other)  Medication Sig   apixaban (ELIQUIS) 5 MG TABS tablet Take 1 tablet (5 mg total) by mouth 2 (two) times daily.   atorvastatin (LIPITOR) 40 MG tablet Take 40 mg by mouth daily.    B-D UF III MINI PEN NEEDLES 31G X 5 MM MISC USE AS DIRECTED WITH LANTUS SOLOSTAR   busPIRone (BUSPAR) 10 MG tablet Take 1 tablet (10 mg total) by mouth at bedtime.   DULoxetine (CYMBALTA) 30 MG capsule Take 30 mg by mouth at bedtime.   DULoxetine (CYMBALTA) 60 MG capsule Take 60 mg by mouth daily.    esomeprazole (NEXIUM) 20 MG capsule Take 20 mg by mouth as needed.   furosemide (LASIX) 20 MG tablet Take 20 mg by mouth 3 (three) times a week.   Insulin Glargine (BASAGLAR KWIKPEN) 100 UNIT/ML SOPN Inject 31 Units into the skin daily.    MAGNESIUM-ZINC PO Take 1 tablet by mouth daily.   Melatonin 3  MG TABS Take 1 tablet by mouth daily as needed (for sleep).    metoprolol succinate (TOPROL-XL) 25 MG 24 hr tablet TAKE 1/2 TABLET BY MOUTH EVERY DAY   Multiple Vitamin (MULTIVITAMIN) tablet Take 1 tablet by mouth daily.   nitroGLYCERIN (NITROSTAT) 0.4 MG SL tablet Place 0.4 mg under the tongue every 5 (five) minutes as needed for chest pain.   nystatin ointment (MYCOSTATIN) Apply 1 application topically as needed.    ondansetron (ZOFRAN-ODT) 4 MG disintegrating tablet Take 4 mg by mouth every 8 (eight) hours as needed for nausea or vomiting.   ONETOUCH ULTRA test strip FOR USE OF CHEACKING BLOOD ONCE DAILY   pantoprazole (PROTONIX) 40 MG tablet TAKE 1 TABLET BY MOUTH EVERY DAY   pioglitazone (ACTOS) 45 MG tablet Take 45 mg by mouth daily.   No current facility-administered medications for this visit. (Other)      REVIEW OF SYSTEMS: ROS    Positive for: Endocrine   Last edited by [name] on 07/16/2020  8:20 AM. (History)       ALLERGIES Allergies  Allergen Reactions   Levaquin [Levofloxacin] Itching and Rash   Lisinopril Itching and Rash   Victoza [Liraglutide]     Severe fatigue &  insomnia    PAST MEDICAL HISTORY Past Medical History:  Diagnosis Date   Acute renal failure (ARF) ([place]) 09/24/2015   Atrial premature complexes    CAD (coronary artery disease) of artery bypass graft    Early occlusion of saphenous vein graft to intermediate and marginal branch in February 2007 following bypass grafting    CAD (coronary artery disease), native coronary artery cardiologist-- dr Liane Comber   hx NSTEMI 09-24-2015  s/p  CABG x5 on 10-01-2017;  post op STEMI inferolateral wall,  SVG OM1 and SVG OM2 occluded, distal OM occlusion the calpruit, treated medically // Myoview 7/21: no ischemia, EF 65, low risk    CKD (chronic kidney disease), stage III ([name])    Elevated troponin    Erectile dysfunction    Esophageal reflux    History of atrial fibrillation     post op CABG 10-02-2015   History of kidney stones    History of kidney stones    History of non-ST elevation myocardial infarction (NSTEMI) 09/24/2015   s/p  CABG x5   History of ST elevation myocardial infarction (STEMI) 10/22/2015   inferior wall,  post op CABG 10-02-2015   Hyperlipidemia    Hypertension    Kidney stone    h/o   Left ureteral stone    Mild atherosclerosis of both carotid arteries    Nephrolithiasis    per CT bilateral non-obstructive calculi   OSA (obstructive sleep apnea)    Peripheral artery disease    LE Arterial US 01/2019: R PTA and ATA occluded; L ATA occluded   RBBB (right bundle branch block)    Renal atrophy, right    Sleep apnea    wears cpap    ST elevation myocardial infarction (STEMI) of inferior wall ([place]) 10/22/2015   Type 2 diabetes mellitus treated with insulin ([place])    followed by pcp   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 ([place]) 03/01/2008   Wears glasses    Past Surgical History:  Procedure Laterality Date   [place] N/A 09/26/2015   Procedure: Left Heart Cath and Coronary Angiography;  Surgeon: Troy Sine, MD;  Location: [facility];  Service: Cardiovascular;  Laterality: N/A;   CARDIAC CATHETERIZATION N/A 10/22/2015   Procedure: Left Heart Cath and Coronary Angiography;  Surgeon: Sherren Mocha, MD;  Location: [facility];  Service: Cardiovascular;  Laterality: N/A;   CATARACT EXTRACTION W/ INTRAOCULAR LENS  IMPLANT, BILATERAL  2017   COLONOSCOPY     CORONARY ARTERY BYPASS GRAFT N/A 10/02/2015   Procedure: CORONARY ARTERY BYPASS GRAFTING (CABG) X5 LIMA-LAD; SVG-DIAG; SVG-OM; SVG-PD; SVG-RAMUS TRANSESOPHAGEAL ECHOCARDIOGRAM (TEE) ENDOSCOPIC GREATER SAPHENOUS VEIN HARVEST BILAT LE;  Surgeon: Ivin Poot, MD;  Location: [facility];  Service: Open Heart Surgery;  Laterality: N/A;    CYSTOSCOPY/URETEROSCOPY/HOLMIUM LASER/STENT PLACEMENT Left 08/10/2018   Procedure: CYSTOSCOPY/URETEROSCOPY/HOLMIUM LASER/STENT PLACEMENT;  Surgeon: Festus Aloe, MD;  Location: [facility];  Service: Urology;  Laterality: Left;   CYSTOSCOPY/URETEROSCOPY/HOLMIUM LASER/STENT PLACEMENT Left 09/10/2018   Procedure: CYSTOSCOPY/URETEROSCOPY/HOLMIUM LASER/STENT EXCHANGE;  Surgeon: Festus Aloe, MD;  Location: [facility];  Service: Urology;  Laterality: Left;   LEFT HEART CATHETERIZATION WITH CORONARY ANGIOGRAM N/A 04/13/2014   Procedure: LEFT HEART CATHETERIZATION WITH CORONARY ANGIOGRAM;  Surgeon: Jacolyn Reedy, MD;  Location: [facility];  Service: Cardiovascular;  Laterality: N/A;   LEG SURGERY Right age 32   closed reduction leg fracture  POLYPECTOMY     TEE WITHOUT CARDIOVERSION N/A 10/02/2015   Procedure: TRANSESOPHAGEAL ECHOCARDIOGRAM (TEE);  Surgeon: Ivin Poot, MD;  Location: [facility];  Service: Open Heart Surgery;  Laterality: N/A;   URETEROSCOPY WITH HOLMIUM LASER LITHOTRIPSY Bilateral 2004;  2005  dr grapey  @[facility]    VASECTOMY      FAMILY HISTORY Family History  Problem Relation Age of Onset   Heart attack Mother    Heart attack Father    Diabetes Brother    Pancreatic cancer Brother    Diabetes Brother    Colon cancer Neg Hx    Esophageal cancer Neg Hx    Prostate cancer Neg Hx    Rectal cancer Neg Hx    Stomach cancer Neg Hx    Colon polyps Neg Hx     SOCIAL HISTORY Social History   Tobacco Use   Smoking status: Never Smoker   Smokeless tobacco: Never Used  Vaping Use   Vaping Use: Never used  Substance Use Topics   Alcohol use: Not Currently   Drug use: Never         OPHTHALMIC EXAM:  Base Eye Exam    Visual Acuity (Snellen - Linear)      Right Left   Dist cc 20/30 -2 20/30 -2   Correction: Glasses       Tonometry (Tonopen, 8:24 AM)      Right Left   Pressure 15 19       Pupils      Pupils Dark Light  Shape React APD   Right PERRL 6 6 Round Minimal None   Left PERRL 6 6 Round Minimal None       Visual Fields (Counting fingers)      Left Right    Full Full       Neuro/Psych    Oriented x3: Yes   Mood/Affect: Normal       Dilation    Both eyes: 1.0% Mydriacyl, 2.5% Phenylephrine @ 8:24 AM        Slit Lamp and Fundus Exam    External Exam      Right Left   External Normal Normal       Slit Lamp Exam      Right Left   Lids/Lashes Normal Normal   Conjunctiva/Sclera White and quiet White and quiet   Cornea Clear Clear   Anterior Chamber Deep and quiet Deep and quiet   Iris Round and reactive Round and reactive   Lens Posterior chamber intraocular lens Posterior chamber intraocular lens   Anterior Vitreous Normal Normal       Fundus Exam      Right Left   Posterior Vitreous Normal Normal   Disc Normal Normal   C/D Ratio 0.5 0.5   Macula Macular thickening superotemporal to fovea., Microaneurysms Macular thickening, Microaneurysms   Vessels NPDR- Moderate NPDR- Moderate   Periphery Choroidal nevus, small in macula,, stable, 2 dd size, flat Normal          IMAGING AND PROCEDURES  Imaging and Procedures for 07/16/20  Color Fundus Photography Optos - OU - Both Eyes       Right Eye Progression has been stable. Disc findings include normal observations. Macula : edema. Periphery : normal observations.   Left Eye Progression has been stable. Periphery : normal observations.   Notes Moderate nonproliferative diabetic retinopathy OU stable, minor CSME extra foveal superotemporal right eye however new and increasing.  Left eye with juxta foveal CSME will  consider observation at this time with good acuity left eye  OD with small posterior pole nevus, choroidal, flat, 1.5-2 disc diameters, no high risk features.       OCT, Retina - OU - Both Eyes       Right Eye Quality was good. Scan locations included subfoveal. Central Foveal Thickness: 259. Progression  has worsened.   Left Eye Quality was good. Scan locations included subfoveal. Central Foveal Thickness: 279. Progression has been stable.   Notes CSME OD, superotemporal to the fovea.  New since June.  Extra foveal involvement of CSME requires consideration of focal laser photocoagulation.  OS minor CSME nasal to the fovea with good acuity will observe this occasion for the left eye       Focal Laser - OD - Right Eye       Time Out Confirmed correct patient, procedure, site, and patient consented.   Anesthesia Topical anesthesia was used. Anesthetic medications included Proparacaine 0.5%.   Laser Information The type of laser was diode. Color was yellow. The duration in seconds was 0.1. The spot size was 100 microns. Laser power was 50. Total spots was 54.   Post-op The patient tolerated the procedure well. There were no complications. The patient received written and verbal post procedure care education.                 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  Will require and recommend focal laser photocoagulation.  Risk and benefits reviewed.  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  Choroidal nevus of right eye No change in size of choroidal nevus.      ICD-10-CM   1. 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 Type 2 diabetes mellitus with left eye affected by moderate nonproliferative retinopathy without macular edema, with long-term current use of insulin ([place])   D35.7017    Z79.4   3. Choroidal nevus of right eye  D31.31     1.  Options of therapy including repeat injections of intravitreal antivegF versus focal laser treatment reviewed.  2.  Focal laser treatment delivered superotemporal macula right eye today for noncenter involved CSME.  3.  OS with no change in maculopathy with good acuity will observe  Ophthalmic Meds Ordered this visit:  No orders of the defined types were placed in this encounter.      Return in about 4 months (around 11/13/2020) for DILATE OU, OCT.  Patient Instructions  Diabetic Retinopathy Diabetic retinopathy is a disease of the retina. The retina is a light-sensitive membrane at the back of the eye. Retinopathy is a complication of diabetes (diabetes mellitus) and a common cause of bad eyesight (visual impairment). It can eventually cause blindness. Early detection and treatment of diabetic retinopathy is important in keeping your eyes healthy and preventing further damage to them. What are the causes? Diabetic retinopathy is caused by blood sugar (glucose) levels that are too high for an extended period of time. High blood glucose over an extended period of time can:  Damage small blood vessels in the retina, allowing blood to leak through the vessel walls.  Cause new, abnormal blood vessels to grow on the retina. This can scar the retina in the advanced stage of diabetic retinopathy. What increases the risk? You are more likely to develop this condition if:  You have had diabetes for a long time.  You have poorly controlled blood glucose.  You have high blood pressure. What are the signs or symptoms? In the early stages of diabetic retinopathy, there are often no symptoms. As the condition gets worse, symptoms may include:  Blurred vision. This is usually caused by swelling due to abnormal blood glucose levels. The blurriness may go away when blood glucose levels return to normal.  Moving specks or dark  spots (floaters) in your vision. These can be caused by a small amount of bleeding (hemorrhage) from retinal blood vessels.  Missing parts of your field of vision, such as vision at the sides of the eyes. This can be caused by larger retinal hemorrhages.  Difficulty reading.  Double vision.  Pain in one or both eyes.  Feeling pressure in one or both eyes.  Trouble seeing straight lines. Straight lines may not look straight.  Redness of the eyes that does not go away. How is this diagnosed? This condition may be diagnosed with an eye exam in which your eye care specialist puts drops in your eyes that enlarge (dilate) your pupils. This lets your health care provider examine your retina and check for changes in your retinal blood vessels. How is this treated? This condition may be treated by:  Keeping your blood glucose and blood pressure within a target range.  Using a type of laser beam to seal your retinal blood vessels. This stops them from bleeding and decreases pressure in your eye.  Getting shots of medicine in the eye to reduce swelling of the center of the retina (macula). You may be given: ? Anti-VEGF medicine. This medicine can help slow vision loss, and may even improve vision. ? Steroid medicine. Follow these instructions at home:   Follow your diabetes management plan as directed by your health care provider. This may include exercising regularly and eating a healthy diet.  Keep your blood glucose level and your blood pressure in your target range, as directed by your health care provider.  Check your blood glucose as often as directed.  Take over the counter and prescription medicines only as told by your health care provider. This includes insulin and oral diabetes medicine.  Get your eyes checked at least once every year. An eye specialist can usually see diabetic retinopathy developing long before it starts to cause problems. In many cases, it can be treated to  prevent complications from occurring.  Do not use any products that contain nicotine or tobacco, such as cigarettes and e-cigarettes. If you need help quitting, ask your health care provider.  Keep all follow-up visits as told by your health care provider. This is important. Contact a health care provider if:  You notice gradual blurring or other changes in your vision over time.  You notice that your glasses or contact lenses do not make things look as sharp as they once did.  You have trouble reading or seeing details at a distance with either eye.  You notice a change in your vision or notice that parts of your field of vision appear missing or hazy.  You suddenly see moving specks or dark spots in the field  of vision of either eye. Get help right away if:  You have sudden pain or pressure in one or both eyes.  You suddenly lose vision or a curtain or veil seems to come across your eyes.  You have a sudden burst of floaters in your vision. Summary  Diabetic retinopathy is a disease of the retina. The retina is a light-sensitive membrane at the back of the eye. Retinopathy is a complication of diabetes.  Get your eyes checked at least once every year. An eye specialist can usually see diabetic retinopathy developing long before it starts to cause problems. In many cases, it can be treated to prevent complications from occurring.  Keep your blood glucose and your blood pressure in target range. Follow your diabetes management plan as directed by your health care provider.  Protect your eyes. Wear sunglasses and eye protection when needed. This information is not intended to replace advice given to you by your health care provider. Make sure you discuss any questions you have with your health care provider. Document Revised: 10/14/2017 Document Reviewed: 10/06/2016 Elsevier Patient Education  2020 [employer].     Explained the diagnoses, plan, and follow up with the patient  and they expressed understanding.  Patient expressed understanding of the importance of proper follow up care.   Clent Demark Rishika Mccollom M.D. Diseases & Surgery of the Retina and Vitreous Retina [facility] Diabetic [place] 07/16/20     Abbreviations: M myopia (nearsighted); A astigmatism; H hyperopia (farsighted); P presbyopia; Mrx spectacle prescription;  CTL contact lenses; OD right eye; OS left eye; OU both eyes  XT exotropia; ET esotropia; PEK punctate epithelial keratitis; PEE punctate epithelial erosions; DES dry eye syndrome; MGD meibomian gland dysfunction; ATs artificial tears; PFAT's preservative free artificial tears; [place] nuclear sclerotic cataract; PSC posterior subcapsular cataract; ERM epi-retinal membrane; PVD posterior vitreous detachment; RD retinal detachment; DM diabetes mellitus; DR diabetic retinopathy; NPDR non-proliferative diabetic retinopathy; PDR proliferative diabetic retinopathy; CSME clinically significant macular edema; DME diabetic macular edema; dbh dot blot hemorrhages; CWS cotton wool spot; POAG primary open angle glaucoma; C/D cup-to-disc ratio; HVF humphrey visual field; GVF goldmann visual field; OCT optical coherence tomography; IOP intraocular pressure; BRVO Branch retinal vein occlusion; CRVO central retinal vein occlusion; CRAO central retinal artery occlusion; BRAO branch retinal artery occlusion; RT retinal tear; SB scleral buckle; PPV pars plana vitrectomy; VH Vitreous hemorrhage; PRP panretinal laser photocoagulation; IVK intravitreal kenalog; VMT vitreomacular traction; MH Macular hole;  NVD neovascularization of the disc; NVE neovascularization elsewhere; AREDS age related eye disease study; ARMD age related macular degeneration; POAG primary open angle glaucoma; EBMD epithelial/anterior basement membrane dystrophy; ACIOL anterior chamber intraocular lens; IOL intraocular lens; PCIOL posterior chamber intraocular lens; Phaco/IOL phacoemulsification with intraocular lens  placement; [place] photorefractive keratectomy; LASIK laser assisted in situ keratomileusis; HTN hypertension; DM diabetes mellitus; COPD chronic obstructive pulmonary disease

## 2020-07-18 ENCOUNTER — Other Ambulatory Visit: Payer: Self-pay

## 2020-07-18 ENCOUNTER — Ambulatory Visit (HOSPITAL_COMMUNITY)
Admission: RE | Admit: 2020-07-18 | Discharge: 2020-07-18 | Disposition: A | Discharge status: Home / Self Care | Payer: Medicare Other | Source: Ambulatory Visit | Attending: Internal Medicine | Admitting: Internal Medicine

## 2020-07-18 DIAGNOSIS — R1310 Dysphagia, unspecified: Secondary | ICD-10-CM | POA: Diagnosis present

## 2020-07-20 ENCOUNTER — Ambulatory Visit: Payer: Medicare Other | Attending: Internal Medicine

## 2020-07-20 DIAGNOSIS — Z23 Encounter for immunization: Secondary | ICD-10-CM

## 2020-07-20 NOTE — Progress Notes (Signed)
   Covid-19 Vaccination Clinic  Name:  DANZEL MARSZALEK    MRN: [id number] DOB: [birth year]  07/20/2020  Mr. Seeling was observed post Covid-19 immunization for 15 minutes without incident. He was provided with Vaccine Information Sheet and instruction to access the V-Safe system.   Mr. Vandevoort was instructed to call 911 with any severe reactions post vaccine: [name] Difficulty breathing  . Swelling of face and throat  . A fast heartbeat  . A bad rash all over body  . Dizziness and weakness

## 2020-07-25 ENCOUNTER — Ambulatory Visit (HOSPITAL_COMMUNITY): Payer: Medicare Other

## 2020-07-25 DIAGNOSIS — R0989 Other specified symptoms and signs involving the circulatory and respiratory systems: Secondary | ICD-10-CM | POA: Insufficient documentation

## 2020-07-25 DIAGNOSIS — K219 Gastro-esophageal reflux disease without esophagitis: Secondary | ICD-10-CM | POA: Insufficient documentation

## 2020-07-25 DIAGNOSIS — R09A2 Foreign body sensation, throat: Secondary | ICD-10-CM | POA: Insufficient documentation

## 2020-07-25 DIAGNOSIS — R490 Dysphonia: Secondary | ICD-10-CM | POA: Insufficient documentation

## 2020-08-05 NOTE — Progress Notes (Signed)
Virtual Visit via Telephone Note   This visit type was conducted due to national recommendations for restrictions regarding the COVID-19 Pandemic (e.g. social distancing) in an effort to limit this patient's exposure and mitigate transmission in our community.  Due to his co-morbid illnesses, this patient is at least at moderate risk for complications without adequate follow up.  This format is felt to be most appropriate for this patient at this time.  The patient did not have access to video technology/had technical difficulties with video requiring transitioning to audio format only (telephone).  All issues noted in this document were discussed and addressed.  No physical exam could be performed with this format.  Please refer to the patient's chart for his  consent to telehealth for [facility].    Date:  08/06/2020   ID:  Fernando Hess, DOB Sep 15, 1946, MRN [id number] The patient was identified using 2 identifiers.  Patient Location: Home Provider Location: Home Office  PCP:  [name], MD  Cardiologist:  Ena Dawley, MD   Electrophysiologist:  Vickie Epley, MD  Sleep:Dr. Dohmeier Nephrologist:Dr. Justin Mend  Evaluation Performed:  Follow-Up Visit  Chief Complaint:  Hospitalization Follow-up (ED visit for weakness, dyspnea)    Patient Profile: Fernando Hess is a 74 y.o. male with:  Coronary artery disease ? S/pMI in 09/2015 >> s/pCABG ? S/pinferolateral STEMI2/17>> S-OM1 and S-OM2 occluded; dOM occluded >> med Rx ? Myoview 10/2018: no isch, EF 52, apical thinning  ? Myoview 7/21: low risk   Heart failure with preserved ejection fraction  ? admx in 11/2019  Paroxysmal atrial fibrillation   CHA2DS2-VASc Score = 6 >> Apixaban      Diabetes mellitus  Hypertension  Hyperlipidemia  Chronic kidney disease stage IV  Obstructive sleep apnea  Peripheral arterial disease ? eval by Dr. Gwenlyn Found 7.2020; bilat tibial disease >> Med Rx (Pletal) >> DC'd with  CHF  Prior CV studies: Carotid US 06/21/20 Bilat ICA 1-39  Event monitor 04/2020  Sinus bradycardia with minimum rate 48 BPM, pauses up to 3 seconds even during awake hours.  Frequent epsiodes of atrial fibrillation with RVR (2% of the monitoring time).  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    Myoview 03/20/20 EF 65, apical thinning artifact, no ischemia, low risk   Echocardiogram 11/19/2019 EF 60-65, moderate LVH, GR 1 DD, no RWMA, mild AI, mild MR, mildly dilated ascending aorta (39 mm)  LE arterial US 02/11/2019 Right PTA and ATA occluded; left ATA occluded  Myoview 10/21/2018 Low risk, mildly abnormal stress nuclear study with apical thinning but no ischemia; EF 52 with mild global hypokinesis  Holter 06/11/18 Very frequent supraventricular ectopy, contributing to 15% of overall beats. Very frequent couplets and a very short runs of atrial tachycardia.  Echocardiogram 10/23/2015 EF 55-60, normal wall motion, mildly calcified aortic valve leaflets, mild AI, mild to moderate MR  Cardiac catheterization 10/22/2015 LM ostial 50 LAD mid 90 RI ostial 90 LCx proximal 80; OM1 80; OM2 100 (culprit for MI) RCA mid 50 LIMA-LAD patent SVG-RPDA patent SVG-D1 patent SVG-OM1 100 SVG-OM2100 Medical therapy  Pre-CABG Dopplers 09/28/2015 Summary: Findings suggest 1-39% internal carotid artery stenosis bilaterally. Vertebral arteries are patent with antegrade flow. Bilateral ABIs are within normal limits.   History of Present Illness:   Mr. Ground was seen in 6/21 and noted worsening shortness of breath and episodes of syncope.  A Myoview was obtained and was low risk.  An  event monitor showed episodes of AFib w RVR as well as 3 sec pauses.  He was seen by Dr. [name] with EP for evaluation of tachy-brady syndrome.  The pauses were less than 3 sec and he was not felt to need a pacemaker.  ILR was offered but the  patient declined.    The pt was seen in the ED with a fall and complaints of shortness of breath on 06/25/20.  Notes indicate he had profound weakness shortly after standing.  He had recurrent symptoms and eventually was transported via EMS.  BNP was minimally elevated at 161.7 but he was not felt to be volume overloaded.  His hs-Troponins were not c/w ACS.  CXR was unremarkable.  An EKG showed normal sinus rhythm and no ST-TW changes.    He has since been evaluated by pulmonology.  He is being referred to ENT and is also to undergo PFTs.  He had a laryngoscopy with ENT.  He had evidence of changes from acid reflux.  He is seen for follow-up.  He and his wife were both on the phone today.  The episode that occurred when he went to the emergency room sounds like a hypoglycemic episode.  He notes that his sugar was low at that time.  It looks like his sugars were elevated when he went to the emergency room.  He does continue to have episodes of lightheadedness when he stands.  He has not had further syncope.  He remains short of breath with certain activities.  He has not had chest pain.  He has not had leg swelling.  Past Medical History:  Diagnosis Date  . Acute renal failure (ARF) ([place]) 09/24/2015  . Atrial premature complexes   . CAD (coronary artery disease) of artery bypass graft    Early occlusion of saphenous vein graft to intermediate and marginal branch in February 2007 following bypass grafting   . CAD (coronary artery disease), native coronary artery cardiologist-- dr Raliegh Ip. nelson   hx NSTEMI 09-24-2015  s/p  CABG x5 on 10-01-2017;  post op STEMI inferolateral wall,  SVG OM1 and SVG OM2 occluded, distal OM occlusion the calpruit, treated medically // Myoview 7/21: no ischemia, EF 65, low risk   . CKD (chronic kidney disease), stage III ([place])   . Elevated troponin   . Erectile dysfunction   . Esophageal reflux   . History of atrial fibrillation    post op CABG 10-02-2015  . History of  kidney stones   . History of kidney stones   . History of non-ST elevation myocardial infarction (NSTEMI) 09/24/2015   s/p  CABG x5  . History of ST elevation myocardial infarction (STEMI) 10/22/2015   inferior wall,  post op CABG 10-02-2015  . Hyperlipidemia   . Hypertension   . Kidney stone    h/o  . Left ureteral stone   . Mild atherosclerosis of both carotid arteries   . Nephrolithiasis    per CT bilateral non-obstructive calculi  . OSA (obstructive sleep apnea)   . Peripheral artery disease    LE Arterial US 01/2019: R PTA and ATA occluded; L ATA occluded  . RBBB (right bundle branch block)   . Renal atrophy, right   . Sleep apnea    wears cpap   . ST elevation myocardial infarction (STEMI) of inferior wall ([place]) 10/22/2015  . Type 2 diabetes mellitus treated with insulin ([place])    followed by pcp  . 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 Wears glasses    Past Surgical History:  Procedure Laterality Date  . APPENDECTOMY  1965  . CARDIAC CATHETERIZATION N/A 09/26/2015   Procedure: Left Heart Cath and Coronary Angiography;  Surgeon: Troy Sine, MD;  Location: [place] [facility];  Service: Cardiovascular;  Laterality: N/A;  . CARDIAC CATHETERIZATION N/A 10/22/2015   Procedure: Left Heart Cath and Coronary Angiography;  Surgeon: Sherren Mocha, MD;  Location: [place] [facility];  Service: Cardiovascular;  Laterality: N/A;  . CATARACT EXTRACTION W/ INTRAOCULAR LENS  IMPLANT, BILATERAL  2017  . COLONOSCOPY    . CORONARY ARTERY BYPASS GRAFT N/A 10/02/2015   Procedure: CORONARY ARTERY BYPASS GRAFTING (CABG) X5 LIMA-LAD; SVG-DIAG; SVG-OM; SVG-PD; SVG-RAMUS TRANSESOPHAGEAL ECHOCARDIOGRAM (TEE) ENDOSCOPIC GREATER SAPHENOUS VEIN HARVEST BILAT LE;  Surgeon: Ivin Poot, MD;  Location: [place];  Service: Open Heart Surgery;  Laterality: N/A;  . CYSTOSCOPY/URETEROSCOPY/HOLMIUM LASER/STENT PLACEMENT  Left 08/10/2018   Procedure: CYSTOSCOPY/URETEROSCOPY/HOLMIUM LASER/STENT PLACEMENT;  Surgeon: Festus Aloe, MD;  Location[facility];  Service: Urology;  Laterality: Left;  . CYSTOSCOPY/URETEROSCOPY/HOLMIUM LASER/STENT PLACEMENT Left 09/10/2018   Procedure: CYSTOSCOPY/URETEROSCOPY/HOLMIUM LASER/STENT EXCHANGE;  Surgeon: Festus Aloe, MD;  Location: [facility];  Service: Urology;  Laterality: Left;  . LEFT HEART CATHETERIZATION WITH CORONARY ANGIOGRAM N/A 04/13/2014   Procedure: LEFT HEART CATHETERIZATION WITH CORONARY ANGIOGRAM;  Surgeon: Jacolyn Reedy, MD;  Location: [facility] CATH LAB;  Service: Cardiovascular;  Laterality: N/A;  . LEG SURGERY Right age 54   closed reduction leg fracture  . POLYPECTOMY    . TEE WITHOUT CARDIOVERSION N/A 10/02/2015   Procedure: TRANSESOPHAGEAL ECHOCARDIOGRAM (TEE);  Surgeon: Ivin Poot, MD;  Location: [place];  Service: Open Heart Surgery;  Laterality: N/A;  . URETEROSCOPY WITH HOLMIUM LASER LITHOTRIPSY Bilateral 2004;  2005  dr Risa Grill  @[email or web address]   . VASECTOMY       Current Meds  Medication Sig  . apixaban (ELIQUIS) 5 MG TABS tablet Take 1 tablet (5 mg total) by mouth 2 (two) times daily.  [name] atorvastatin (LIPITOR) 40 MG tablet Take 40 mg by mouth daily.   . B-D UF III MINI PEN NEEDLES 31G X 5 MM MISC USE AS DIRECTED WITH LANTUS SOLOSTAR  . busPIRone (BUSPAR) 10 MG tablet Take 1 tablet (10 mg total) by mouth at bedtime.  . DULoxetine (CYMBALTA) 30 MG capsule Take 30 mg by mouth at bedtime.  . DULoxetine (CYMBALTA) 60 MG capsule Take 60 mg by mouth daily.   [name] esomeprazole (NEXIUM) 20 MG capsule Take 20 mg by mouth as needed.  . furosemide (LASIX) 20 MG tablet Take 20 mg by mouth as needed for shortness of breath or edema.      . Insulin Glargine (BASAGLAR KWIKPEN) 100 UNIT/ML SOPN Inject 31 Units into the skin daily.   [name] MAGNESIUM-ZINC PO Take 1 tablet by mouth daily.  . Melatonin 3 MG TABS Take 1 tablet by mouth daily as needed (for sleep).     . metoprolol succinate (TOPROL-XL) 25 MG 24 hr tablet TAKE 1/2 TABLET BY MOUTH EVERY DAY  . Multiple Vitamin (MULTIVITAMIN) tablet Take 1 tablet by mouth daily.  . nitroGLYCERIN (NITROSTAT) 0.4 MG SL tablet Place 0.4 mg under the tongue every 5 (five) minutes as needed for chest pain.  [name] nystatin ointment (MYCOSTATIN) Apply 1 application topically as needed.   . ondansetron (ZOFRAN-ODT) 4 MG disintegrating tablet Take 4 mg by mouth every 8 (eight) hours as needed  for nausea or vomiting.  Glory Rosebush ULTRA test strip FOR USE OF CHEACKING BLOOD ONCE DAILY  . pantoprazole (PROTONIX) 40 MG tablet TAKE 1 TABLET BY MOUTH EVERY DAY  . pioglitazone (ACTOS) 45 MG tablet Take 45 mg by mouth daily.     Allergies:   Levaquin [levofloxacin], Lisinopril, and Victoza [liraglutide]   Social History   Tobacco Use  . Smoking status: Never Smoker  . Smokeless tobacco: Never Used  Vaping Use  . Vaping Use: Never used  Substance Use Topics  . Alcohol use: Not Currently  . Drug use: Never     Family Hx: The patient's family history includes Diabetes in his brother and brother; Heart attack in his father and mother; Pancreatic cancer in his brother. There is no history of Colon cancer, Esophageal cancer, Prostate cancer, Rectal cancer, Stomach cancer, or Colon polyps.  ROS:   Please see the history of present illness.       Labs/Other Tests and Data Reviewed:    EKG:  An ECG dated 06/26/2020 was personally reviewed today and demonstrated:  NSR, HR 64, normal axis, no ST-T wave changes, PACs  Recent Labs: 03/09/2020: NT-Pro BNP 670 06/11/2020: TSH 0.815 06/25/2020: B Natriuretic Peptide 161.7; BUN 32; Creatinine, Ser 2.27; Hemoglobin 14.3; Platelets 261; Potassium 4.7; Sodium 137   Recent Lipid Panel Lab Results  Component Value Date/Time   CHOL 129 06/11/2020 09:15 AM   TRIG 84 06/11/2020 09:15 AM   HDL 41 06/11/2020 09:15 AM   CHOLHDL 3.1 06/11/2020 09:15 AM   CHOLHDL 2.7 10/23/2015 05:15 AM    LDLCALC 72 06/11/2020 09:15 AM    Wt Readings from Last 3 Encounters:  08/06/20 191 lb (86.6 kg)  07/11/20 196 lb (88.9 kg)  06/27/20 194 lb 12.8 oz (88.4 kg)     Risk Assessment/Calculations:     CHA2DS2-VASc Score = 6  This indicates a 9.7% annual risk of stroke. The patient's score is based upon: CHF History: 1 HTN History: 1 Diabetes History: 1 Stroke History: 0 Vascular Disease History: 1 Age Score: 2 Gender Score: 0     Objective:    Vital Signs:  BP 103/68   Pulse 71   Ht 5' 4.75" (1.645 m)   Wt 191 lb (86.6 kg)   BMI 32.03 kg/m    VITAL SIGNS:  reviewed GEN:  no acute distress PSYCH:  normal affect  ASSESSMENT & PLAN:    1. Coronary artery disease involving native coronary artery of native heart without angina pectoris He has a history of bypass surgery in 2017.  He had a myocardial infarction about a month later with demonstration of an occluded vein graft to the OM1/OM2 and distal OM occlusion.  He has been managed medically.    Myoview in July 2021 was low risk.  He is not having anginal symptoms.  At this point, I do not think his shortness of breath is related to ischemia.  He is not on aspirin as he is on Apixaban.  Continue atorvastatin.  2. Chronic heart failure with preserved ejection fraction ([name]) Overall, his volume status seems to be stable.  His blood pressure tends to run low.  I think we can potentially try him on as needed furosemide.  Hopefully this will help his dizziness.  I have gone over extensively with him and his wife the need to weigh daily and when to take furosemide.  He should take furosemide if his weight goes up 3 pounds or more in 1 day,  he has leg swelling or worsening shortness of breath.  3. Paroxysmal atrial fibrillation ([name]) 4. Tachycardia-bradycardia syndrome ([place]) CHA2DS2-VASc Score = 6 [CHF History: 1, HTN History: 1, Diabetes History: 1, Stroke History: 0, Vascular Disease History: 1, Age Score: 2, Gender Score: 0].   Therefore, the patient's annual risk of stroke is 9.7 %.   As noted, he saw Dr. [name] with the EP.  He was not felt to need a pacemaker.  ILR was recommended but he declined.  I went over the indications for this with the patient today.  He is taking apixaban twice daily.  Most recent hemoglobin and creatinine were stable.  His weight is greater than 60 kg and age less than 42.  Therefore, he should continue apixaban 5 mg twice daily.  5. Dizziness I believe his dizziness is probably related to orthostasis.  Hopefully decreasing his furosemide to as needed will help with this.  I have also recommend that he use compression stockings.  His recent trip to the emergency room seemed to be related to hypoglycemia.  6. Shortness of breath He is going through an evaluation with pulmonology.  He has PFTs later today.  If his shortness of breath continues, we may need to consider cardiopulmonary stress testing or possibly right heart catheterization.  Keep follow-up with Dr. Meda Coffee in March as planned.  7. CKD (chronic kidney disease) stage 4, GFR 15-29 ml/min ([name]) Recent creatinine fairly stable.  He is followed by Dr. Justin Mend with nephrology.  8. Diabetes mellitus type 2, noninsulin dependent ([place]) Continue follow-up with primary care.       Time:   Today, I have spent 32 minutes with the patient with telehealth technology discussing the above problems.     Medication Adjustments/Labs and Tests Ordered: Current medicines are reviewed at length with the patient today.  Concerns regarding medicines are outlined above.   Tests Ordered: No orders of the defined types were placed in this encounter.   Medication Changes: No orders of the defined types were placed in this encounter.   Follow Up:  In Person in 5 month(s)  Signed, [name], [name]  08/06/2020 3:08 PM    [facility]

## 2020-08-06 ENCOUNTER — Telehealth (INDEPENDENT_AMBULATORY_CARE_PROVIDER_SITE_OTHER): Payer: Medicare Other | Admitting: Physician Assistant

## 2020-08-06 ENCOUNTER — Encounter: Payer: Self-pay | Admitting: Emergency Medicine

## 2020-08-06 ENCOUNTER — Encounter: Payer: Self-pay | Admitting: Physician Assistant

## 2020-08-06 ENCOUNTER — Other Ambulatory Visit: Payer: Self-pay

## 2020-08-06 ENCOUNTER — Ambulatory Visit: Payer: Medicare Other

## 2020-08-06 ENCOUNTER — Ambulatory Visit (INDEPENDENT_AMBULATORY_CARE_PROVIDER_SITE_OTHER): Payer: Medicare Other | Admitting: Emergency Medicine

## 2020-08-06 VITALS — BP 103/68 | HR 71 | Ht 64.75 in | Wt 191.0 lb

## 2020-08-06 DIAGNOSIS — R0600 Dyspnea, unspecified: Secondary | ICD-10-CM | POA: Insufficient documentation

## 2020-08-06 DIAGNOSIS — E119 Type 2 diabetes mellitus without complications: Secondary | ICD-10-CM

## 2020-08-06 DIAGNOSIS — G4733 Obstructive sleep apnea (adult) (pediatric): Secondary | ICD-10-CM | POA: Diagnosis not present

## 2020-08-06 DIAGNOSIS — R0602 Shortness of breath: Secondary | ICD-10-CM

## 2020-08-06 DIAGNOSIS — I495 Sick sinus syndrome: Secondary | ICD-10-CM | POA: Diagnosis not present

## 2020-08-06 DIAGNOSIS — I480 Paroxysmal atrial fibrillation: Secondary | ICD-10-CM | POA: Diagnosis not present

## 2020-08-06 DIAGNOSIS — Z9989 Dependence on other enabling machines and devices: Secondary | ICD-10-CM | POA: Diagnosis not present

## 2020-08-06 DIAGNOSIS — R42 Dizziness and giddiness: Secondary | ICD-10-CM

## 2020-08-06 DIAGNOSIS — N184 Chronic kidney disease, stage 4 (severe): Secondary | ICD-10-CM

## 2020-08-06 DIAGNOSIS — I2510 Atherosclerotic heart disease of native coronary artery without angina pectoris: Secondary | ICD-10-CM

## 2020-08-06 DIAGNOSIS — R0609 Other forms of dyspnea: Secondary | ICD-10-CM

## 2020-08-06 DIAGNOSIS — Z9181 History of falling: Secondary | ICD-10-CM

## 2020-08-06 DIAGNOSIS — I5032 Chronic diastolic (congestive) heart failure: Secondary | ICD-10-CM

## 2020-08-06 MED ORDER — ALBUTEROL SULFATE HFA 108 (90 BASE) MCG/ACT IN AERS: 2.0000 | INHALATION_SPRAY | Freq: Four times a day (QID) | RESPIRATORY_TRACT | 6 refills | Status: DC | PRN

## 2020-08-06 NOTE — Assessment & Plan Note (Signed)
Continue CPAP qhs 

## 2020-08-06 NOTE — Assessment & Plan Note (Signed)
Almost certainly multifactorial.  He has upper airway irritation, GERD is a contributor.  Has hoarseness that does correlate with his dyspnea.  Also likely some degree of deconditioning, obesity superimposed on his cardiac disease.  Doubt angina as his Lexiscan in July was reassuring.  Chest x-ray clear.  He was unable to do pulmonary function testing today, poor technique on his prior 10/01/15.  Unclear to me that there is any evidence for obstructive lung disease but we can do a trial of albuterol to see if he gets benefit.  If so we could continue it going forward.  He may benefit the most from cardiopulmonary rehab.  Discussed this with him, we can talk further about it going forward.

## 2020-08-06 NOTE — Addendum Note (Signed)
Addended by: Gavin Potters R on: 08/06/2020 04:39 PM   Modules accepted: Orders

## 2020-08-06 NOTE — Patient Instructions (Addendum)
Medication Instructions:  Your physician has recommended you make the following change in your medication:   1) Change Furosemide to take 1 tablet by mouth as needed; takeFurosemide if weight increases from 1 day to the next by 3 pounds or more, take Furosemide if legs are swollen, or shortness of breath worsens from 1 day to the next .  *If you need a refill on your cardiac medications before your next appointment, please call your pharmacy*  Lab Work: None ordered today  If you have labs (blood work) drawn today and your tests are completely normal, you will receive your results only by: [name] MyChart Message (if you have MyChart) OR . A paper copy in the mail If you have any lab test that is abnormal or we need to change your treatment, we will call you to review the results.  Testing/Procedures: None ordered today  Follow-Up: Keep appointment with Dr. Meda Coffee on 12/04/2020 at 8:00AM  Other Instructions Wear compression stockings (knee-high) daily

## 2020-08-06 NOTE — Patient Instructions (Signed)
We will do a trial of albuterol to see if you get benefit.  Use 2 puffs if you need it for shortness of breath, chest tightness.  Keep track of whether it helps you.  If so we may continue it going forward. Continue reflux medication as it will help decrease inflammation in your upper airway, hoarseness. You may benefit at some point from going back to cardiac rehab.  We can talk about this in the future. Follow with Dr Lamonte Sakai in 1 month or next available to see how your symptoms do with the albuterol

## 2020-08-06 NOTE — Progress Notes (Signed)
Subjective:    Patient ID: Fernando Hess, male    DOB: 1946/03/30, 74 y.o.   MRN: [id number]  HPI 74 year old never smoker with CAD/CABG, CKD, A. fib, hypertension, OSA on CPAP, diabetes, tachybradycardia syndrome followed by Drs Regan Rakers.  He has experienced recurrent falls and tremor.  Most recent [place] 7/21 that was reassuring.  He was seen in October by Dr. Carlis Abbott as initial visit for exertional and resting shortness of breath.  This followed an ER visit on 10/11.  No significant associated cough, wheeze, mucus production, chest pain.  He does sometimes have some upper airway instability, hoarseness and loses voice. The SOB is most noticeable w exertion, but can happen when at rest. He has seen Dr Wilburn Cornelia for hoarseness and UA irritation. Noted GERD. He doesn't feel any GERD on PPI.   CXR 06/25/20 >> reviewed by me, clear   Review of Systems As per HPI  Past Medical History:  Diagnosis Date  . Acute renal failure (ARF) ([place]) 09/24/2015  . Atrial premature complexes   . CAD (coronary artery disease) of artery bypass graft    Early occlusion of saphenous vein graft to intermediate and marginal branch in February 2007 following bypass grafting   . CAD (coronary artery disease), native coronary artery cardiologist-- dr Raliegh Ip. nelson   hx NSTEMI 09-24-2015  s/p  CABG x5 on 10-01-2017;  post op STEMI inferolateral wall,  SVG OM1 and SVG OM2 occluded, distal OM occlusion the calpruit, treated medically // Myoview 7/21: no ischemia, EF 65, low risk   . CKD (chronic kidney disease), stage III ([place])   . Elevated troponin   . Erectile dysfunction   . Esophageal reflux   . History of atrial fibrillation    post op CABG 10-02-2015  . History of kidney stones   . History of kidney stones   . History of non-ST elevation myocardial infarction (NSTEMI) 09/24/2015   s/p  CABG x5  . History of ST elevation myocardial infarction (STEMI) 10/22/2015   inferior wall,  post op CABG 10-02-2015   . Hyperlipidemia   . Hypertension   . Kidney stone    h/o  . Left ureteral stone   . Mild atherosclerosis of both carotid arteries   . Nephrolithiasis    per CT bilateral non-obstructive calculi  . OSA (obstructive sleep apnea)   . Peripheral artery disease    LE Arterial US 01/2019: R PTA and ATA occluded; L ATA occluded  . RBBB (right bundle branch block)   . Renal atrophy, right   . Sleep apnea    wears cpap   . ST elevation myocardial infarction (STEMI) of inferior wall ([place]) 10/22/2015  . Type 2 diabetes mellitus treated with insulin ([place])    followed by pcp  . 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 Wears glasses      Family History  Problem Relation Age of Onset  . Heart attack Mother   . Heart attack Father   . Diabetes Brother   . Pancreatic cancer Brother   . Diabetes Brother   . Colon cancer Neg Hx   . Esophageal cancer Neg Hx   . Prostate cancer Neg Hx   . Rectal cancer Neg Hx   . Stomach cancer Neg Hx   . Colon polyps Neg Hx      Social History   Socioeconomic History  . Marital status: Married    Spouse  name: Not on file  . Number of children: Not on file  . Years of education: Not on file  . Highest education level: Not on file  Occupational History  . Occupation: retired  Tobacco Use  . Smoking status: Never Smoker  . Smokeless tobacco: Never Used  Vaping Use  . Vaping Use: Never used  Substance and Sexual Activity  . Alcohol use: Not Currently  . Drug use: Never  . Sexual activity: Not on file  Other Topics Concern  . 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 Difficulty of Paying Living Expenses: Not on file  Food Insecurity:   . Worried About Charity fundraiser in the Last Year: Not on file  . Ran Out of Food in the Last  Year: Not on file  Transportation Needs:   . Lack of Transportation (Medical): Not on file  . Lack of Transportation (Non-Medical): Not on file  Physical Activity:   . Days of Exercise per Week: Not on file  . Minutes of Exercise per Session: Not on file  Stress:   . Feeling of Stress : Not on file  Social Connections:   . Frequency of Communication with Friends and Family: Not on file  . Frequency of Social Gatherings with Friends and Family: Not on file  . Attends Religious Services: Not on file  . Active Member of Clubs or Organizations: Not on file  . Attends Archivist Meetings: Not on file  . Marital Status: Not on file  Intimate Partner Violence:   . Fear of Current or Ex-Partner: Not on file  . Emotionally Abused: Not on file  . Physically Abused: Not on file  . Sexually Abused: Not on file    Worked in [employer], no inhaled exposures.  No military From [place]  Allergies  Allergen Reactions  . Levaquin [Levofloxacin] Itching and Rash  . Lisinopril Itching and Rash  . Victoza [Liraglutide]     Severe fatigue & insomnia     Outpatient Medications Prior to Visit  Medication Sig Dispense Refill  . apixaban (ELIQUIS) 5 MG TABS tablet Take 1 tablet (5 mg total) by mouth 2 (two) times daily. 60 tablet 3  . atorvastatin (LIPITOR) 40 MG tablet Take 40 mg by mouth daily.     . B-D UF III MINI PEN NEEDLES 31G X 5 MM MISC USE AS DIRECTED WITH LANTUS SOLOSTAR    . busPIRone (BUSPAR) 10 MG tablet Take 1 tablet (10 mg total) by mouth at bedtime. 90 tablet 2  . DULoxetine (CYMBALTA) 30 MG capsule Take 30 mg by mouth at bedtime.    . DULoxetine (CYMBALTA) 60 MG capsule Take 60 mg by mouth daily.   4  . esomeprazole (NEXIUM) 20 MG capsule Take 20 mg by mouth as needed.    . furosemide (LASIX) 20 MG tablet Take 20 mg by mouth as needed for shortness of breath or edema.        . Insulin Glargine (BASAGLAR KWIKPEN) 100 UNIT/ML SOPN Inject 31 Units into the skin daily.     [name]  MAGNESIUM-ZINC PO Take 1 tablet by mouth daily.    . Melatonin 3 MG TABS Take 1 tablet by mouth daily as needed (for sleep).     . metoprolol succinate (TOPROL-XL) 25 MG 24 hr tablet TAKE  1/2 TABLET BY MOUTH EVERY DAY 45 tablet 1  . Multiple Vitamin (MULTIVITAMIN) tablet Take 1 tablet by mouth daily.    . nitroGLYCERIN (NITROSTAT) 0.4 MG SL tablet Place 0.4 mg under the tongue every 5 (five) minutes as needed for chest pain.    [name] nystatin ointment (MYCOSTATIN) Apply 1 application topically as needed.     . ondansetron (ZOFRAN-ODT) 4 MG disintegrating tablet Take 4 mg by mouth every 8 (eight) hours as needed for nausea or vomiting.    Glory Rosebush ULTRA test strip FOR USE OF CHEACKING BLOOD ONCE DAILY    . pantoprazole (PROTONIX) 40 MG tablet TAKE 1 TABLET BY MOUTH EVERY DAY 90 tablet 2  . pioglitazone (ACTOS) 45 MG tablet Take 45 mg by mouth daily.     No facility-administered medications prior to visit.         Objective:   Physical Exam Vitals:   08/06/20 1600  BP: 122/78  Pulse: 72  Temp: (!) 97.5 F (36.4 C)  SpO2: 97%  Weight: 190 lb (86.2 kg)  Height: 5' 5.5" (1.664 m)   Gen: Pleasant, obese man, in no distress  ENT: No lesions,  mouth clear,  oropharynx clear, crowded posterior pharynx, no postnasal drip  Neck: No JVD, no stridor  Lungs: No use of accessory muscles, no crackles or wheezing on normal respiration, no wheeze on forced expiration  Cardiovascular: RRR, heart sounds normal, no murmur or gallops, no peripheral edema  Musculoskeletal: No deformities, no cyanosis or clubbing  Neuro: alert, awake, non focal  Skin: Warm, no lesions or rash      Assessment & Plan:  OSA on CPAP Continue CPAP qhs  Dyspnea Almost certainly multifactorial.  He has upper airway irritation, GERD is a contributor.  Has hoarseness that does correlate with his dyspnea.  Also likely some degree of deconditioning, obesity superimposed on his cardiac disease.  Doubt angina as his  Lexiscan in July was reassuring.  Chest x-ray clear.  He was unable to do pulmonary function testing today, poor technique on his prior 10/01/15.  Unclear to me that there is any evidence for obstructive lung disease but we can do a trial of albuterol to see if he gets benefit.  If so we could continue it going forward.  He may benefit the most from cardiopulmonary rehab.  Discussed this with him, we can talk further about it going forward.  Baltazar Apo, MD, PhD 08/06/2020, 4:27 PM [facility] [phone] or if no answer [phone]

## 2020-08-28 ENCOUNTER — Other Ambulatory Visit: Payer: Self-pay

## 2020-08-28 ENCOUNTER — Encounter: Payer: Self-pay | Admitting: Family Medicine

## 2020-08-28 ENCOUNTER — Ambulatory Visit (INDEPENDENT_AMBULATORY_CARE_PROVIDER_SITE_OTHER): Payer: Medicare Other | Admitting: Family Medicine

## 2020-08-28 VITALS — BP 105/61 | HR 70 | Ht 64.0 in | Wt 195.0 lb

## 2020-08-28 DIAGNOSIS — Z9989 Dependence on other enabling machines and devices: Secondary | ICD-10-CM | POA: Diagnosis not present

## 2020-08-28 DIAGNOSIS — G4733 Obstructive sleep apnea (adult) (pediatric): Secondary | ICD-10-CM

## 2020-08-28 DIAGNOSIS — F419 Anxiety disorder, unspecified: Secondary | ICD-10-CM | POA: Diagnosis not present

## 2020-08-28 NOTE — Patient Instructions (Signed)
Please continue using your CPAP regularly. While your insurance requires that you use CPAP at least 4 hours each night on 70% of the nights, I recommend, that you not skip any nights and use it throughout the night if you can. Getting used to CPAP and staying with the treatment long term does take time and patience and discipline. Untreated obstructive sleep apnea when it is moderate to severe can have an adverse impact on cardiovascular health and raise her risk for heart disease, arrhythmias, hypertension, congestive heart failure, stroke and diabetes. Untreated obstructive sleep apnea causes sleep disruption, nonrestorative sleep, and sleep deprivation. This can have an impact on your day to day functioning and cause daytime sleepiness and impairment of cognitive function, memory loss, mood disturbance, and problems focussing. Using CPAP regularly can improve these symptoms.   I will call [facility] and ask them to reach out to you about the trouble you are having with your CPAP. We will schedule follow up pending contact with [facility]. Please consider seeing psychiatry for mood concerns.    Generalized Anxiety Disorder, Adult Generalized anxiety disorder (GAD) is a mental health disorder. People with this condition constantly worry about everyday events. Unlike normal anxiety, worry related to GAD is not triggered by a specific event. These worries also do not fade or get better with time. GAD interferes with life functions, including relationships, work, and school. GAD can vary from mild to severe. People with severe GAD can have intense waves of anxiety with physical symptoms (panic attacks). What are the causes? The exact cause of GAD is not known. What increases the risk? This condition is more likely to develop in:  Women.  People who have a family history of anxiety disorders.  People who are very shy.  People who experience very stressful life events, such as the death of a loved  one.  People who have a very stressful family environment. What are the signs or symptoms? People with GAD often worry excessively about many things in their lives, such as their health and family. They may also be overly concerned about:  Doing well at work.  Being on time.  Natural disasters.  Friendships. Physical symptoms of GAD include:  Fatigue.  Muscle tension or having muscle twitches.  Trembling or feeling shaky.  Being easily startled.  Feeling like your heart is pounding or racing.  Feeling out of breath or like you cannot take a deep breath.  Having trouble falling asleep or staying asleep.  Sweating.  Nausea, diarrhea, or irritable bowel syndrome (IBS).  Headaches.  Trouble concentrating or remembering facts.  Restlessness.  Irritability. How is this diagnosed? Your health care provider can diagnose GAD based on your symptoms and medical history. You will also have a physical exam. The health care provider will ask specific questions about your symptoms, including how severe they are, when they started, and if they come and go. Your health care provider may ask you about your use of alcohol or drugs, including prescription medicines. Your health care provider may refer you to a mental health specialist for further evaluation. Your health care provider will do a thorough examination and may perform additional tests to rule out other possible causes of your symptoms. To be diagnosed with GAD, a person must have anxiety that:  Is out of his or her control.  Affects several different aspects of his or her life, such as work and relationships.  Causes distress that makes him or her unable to take part  in normal activities.  Includes at least three physical symptoms of GAD, such as restlessness, fatigue, trouble concentrating, irritability, muscle tension, or sleep problems. Before your health care provider can confirm a diagnosis of GAD, these symptoms must  be present more days than they are not, and they must last for six months or longer. How is this treated? The following therapies are usually used to treat GAD:  Medicine. Antidepressant medicine is usually prescribed for long-term daily control. Antianxiety medicines may be added in severe cases, especially when panic attacks occur.  Talk therapy (psychotherapy). Certain types of talk therapy can be helpful in treating GAD by providing support, education, and guidance. Options include: ? Cognitive behavioral therapy (CBT). People learn coping skills and techniques to ease their anxiety. They learn to identify unrealistic or negative thoughts and behaviors and to replace them with positive ones. ? Acceptance and commitment therapy (ACT). This treatment teaches people how to be mindful as a way to cope with unwanted thoughts and feelings. ? Biofeedback. This process trains you to manage your body's response (physiological response) through breathing techniques and relaxation methods. You will work with a therapist while machines are used to monitor your physical symptoms.  Stress management techniques. These include yoga, meditation, and exercise. A mental health specialist can help determine which treatment is best for you. Some people see improvement with one type of therapy. However, other people require a combination of therapies. Follow these instructions at home:  Take over-the-counter and prescription medicines only as told by your health care provider.  Try to maintain a normal routine.  Try to anticipate stressful situations and allow extra time to manage them.  Practice any stress management or self-calming techniques as taught by your health care provider.  Do not punish yourself for setbacks or for not making progress.  Try to recognize your accomplishments, even if they are small.  Keep all follow-up visits as told by your health care provider. This is important. Contact a  health care provider if:  Your symptoms do not get better.  Your symptoms get worse.  You have signs of depression, such as: ? A persistently sad, cranky, or irritable mood. ? Loss of enjoyment in activities that used to bring you joy. ? Change in weight or eating. ? Changes in sleeping habits. ? Avoiding friends or family members. ? Loss of energy for normal tasks. ? Feelings of guilt or worthlessness. Get help right away if:  You have serious thoughts about hurting yourself or others. If you ever feel like you may hurt yourself or others, or have thoughts about taking your own life, get help right away. You can go to your nearest emergency department or call:  Your local emergency services (911 in the [place]  A suicide crisis helpline, such as the [facility] at 567-451-1054. This is open 24 hours a day. Summary  Generalized anxiety disorder (GAD) is a mental health disorder that involves worry that is not triggered by a specific event.  People with GAD often worry excessively about many things in their lives, such as their health and family.  GAD may cause physical symptoms such as restlessness, trouble concentrating, sleep problems, frequent sweating, nausea, diarrhea, headaches, and trembling or muscle twitching.  A mental health specialist can help determine which treatment is best for you. Some people see improvement with one type of therapy. However, other people require a combination of therapies. This information is not intended to replace advice given to you by  your health care provider. Make sure you discuss any questions you have with your health care provider. Document Revised: 08/14/2017 Document Reviewed: 07/22/2016 Elsevier Patient Education  [place].   CPAP and BPAP Information CPAP and BPAP are methods of helping a person breathe with the use of air pressure. CPAP stands for "continuous positive airway pressure." BPAP  stands for "bi-level positive airway pressure." In both methods, air is blown through your nose or mouth and into your air passages to help you breathe well. CPAP and BPAP use different amounts of pressure to blow air. With CPAP, the amount of pressure stays the same while you breathe in and out. With BPAP, the amount of pressure is increased when you breathe in (inhale) so that you can take larger breaths. Your health care provider will recommend whether CPAP or BPAP would be more helpful for you. Why are CPAP and BPAP treatments used? CPAP or BPAP can be helpful if you have:  Sleep apnea.  Chronic obstructive pulmonary disease (COPD).  Heart failure.  Medical conditions that weaken the muscles of the chest including muscular dystrophy, or neurological diseases such as amyotrophic lateral sclerosis (ALS).  Other problems that cause breathing to be weak, abnormal, or difficult. CPAP is most commonly used for obstructive sleep apnea (OSA) to keep the airways from collapsing when the muscles relax during sleep. How is CPAP or BPAP administered? Both CPAP and BPAP are provided by a small machine with a flexible plastic tube that attaches to a plastic mask. You wear the mask. Air is blown through the mask into your nose or mouth. The amount of pressure that is used to blow the air can be adjusted on the machine. Your health care provider will determine the pressure setting that should be used based on your individual needs. When should CPAP or BPAP be used? In most cases, the mask only needs to be worn during sleep. Generally, the mask needs to be worn throughout the night and during any daytime naps. People with certain medical conditions may also need to wear the mask at other times when they are awake. Follow instructions from your health care provider about when to use the machine. What are some tips for using the mask?   Because the mask needs to be snug, some people feel trapped or closed-in  (claustrophobic) when first using the mask. If you feel this way, you may need to get used to the mask. One way to do this is by holding the mask loosely over your nose or mouth and then gradually applying the mask more snugly. You can also gradually increase the amount of time that you use the mask.  Masks are available in various types and sizes. Some fit over your mouth and nose while others fit over just your nose. If your mask does not fit well, talk with your health care provider about getting a different one.  If you are using a mask that fits over your nose and you tend to breathe through your mouth, a chin strap may be applied to help keep your mouth closed.  The CPAP and BPAP machines have alarms that may sound if the mask comes off or develops a leak.  If you have trouble with the mask, it is very important that you talk with your health care provider about finding a way to make the mask easier to tolerate. Do not stop using the mask. Stopping the use of the mask could have a negative impact  on your health. What are some tips for using the machine?  Place your CPAP or BPAP machine on a secure table or stand near an electrical outlet.  Know where the on/off switch is located on the machine.  Follow instructions from your health care provider about how to set the pressure on your machine and when you should use it.  Do not eat or drink while the CPAP or BPAP machine is on. Food or fluids could get pushed into your lungs by the pressure of the CPAP or BPAP.  Do not smoke. Tobacco smoke residue can damage the machine.  For home use, CPAP and BPAP machines can be rented or purchased through home health care companies. Many different brands of machines are available. Renting a machine before purchasing may help you find out which particular machine works well for you.  Keep the CPAP or BPAP machine and attachments clean. Ask your health care provider for specific instructions. Get help  right away if:  You have redness or open areas around your nose or mouth where the mask fits.  You have trouble using the CPAP or BPAP machine.  You cannot tolerate wearing the CPAP or BPAP mask.  You have pain, discomfort, and bloating in your abdomen. Summary  CPAP and BPAP are methods of helping a person breathe with the use of air pressure.  Both CPAP and BPAP are provided by a small machine with a flexible plastic tube that attaches to a plastic mask.  If you have trouble with the mask, it is very important that you talk with your health care provider about finding a way to make the mask easier to tolerate. This information is not intended to replace advice given to you by your health care provider. Make sure you discuss any questions you have with your health care provider. Document Revised: 12/22/2018 Document Reviewed: 07/21/2016 Elsevier Patient Education  [place].
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
## 2020-08-29 ENCOUNTER — Telehealth: Payer: Self-pay | Admitting: Family Medicine

## 2020-08-29 NOTE — Telephone Encounter (Signed)
Pt's wife, Quy Lotts (on [place]) called, was told by NP if not heard from [place] by today, to call her. Would like a call from the nurse.

## 2020-08-29 NOTE — Telephone Encounter (Signed)
Please let [name] know that I reviewed his compliance report over the past 30 days.  Daily compliance is now 63% and 4-hour compliance 33%.  His apneic events are very well managed at the current settings.  I do not see any leak in his mask.  Please remind him that my recommendation is to use CPAP nightly and for greater than 4 hours each night.  Let him know that we have asked [name] to reach out to him in regards to his concerns of his machine operating correctly.  I would like for him to follow-up with me in 6 months.

## 2020-09-18 ENCOUNTER — Other Ambulatory Visit: Payer: Self-pay | Admitting: Physician Assistant

## 2020-09-18 NOTE — Telephone Encounter (Signed)
Prescription refill request for Eliquis received.  Hx: afib Last office visit: 08/06/2020, [place] Scr: 2.27, 06/25/2020 Age: 75 yo Weight: 88.5 kg   Prescription refill sent

## 2020-11-09 ENCOUNTER — Telehealth: Payer: Self-pay | Admitting: Cardiology

## 2020-11-09 NOTE — Telephone Encounter (Signed)
Patient's wife would like to know if the patient needs to have lab work prior to 12/04/20 appointment with Dr. Meda Coffee. Please advise.

## 2020-11-09 NOTE — Telephone Encounter (Signed)
Pt and his wife were both calling to discuss who will be taking over their care when Dr. Meda Coffee leaves.  Both requested to go with the Cardiologist that I will be working with. Informed both parties that I will be working with Dr. Johney Frame and she will be taking over in seeing Dr. Francesca Oman pts when she leaves.  Both verbalized understanding and agrees with this plan.  Both parties felt very reassured, and gracious for all the assistance. Did advise the pt that being he is coming to see Dr. Meda Coffee on 3/22 at 0800, he should come fasting to that lab appt, so that we can check his labs at that time, if needed.  Pt verbalized understanding and agrees with this plan.

## 2020-11-13 ENCOUNTER — Other Ambulatory Visit: Payer: Self-pay

## 2020-11-13 ENCOUNTER — Encounter (INDEPENDENT_AMBULATORY_CARE_PROVIDER_SITE_OTHER): Payer: Self-pay | Admitting: Ophthalmology

## 2020-11-13 ENCOUNTER — Ambulatory Visit (INDEPENDENT_AMBULATORY_CARE_PROVIDER_SITE_OTHER): Payer: Medicare Other | Admitting: Ophthalmology

## 2020-11-13 DIAGNOSIS — E113312 Type 2 diabetes mellitus with moderate nonproliferative diabetic retinopathy with macular edema, left eye: Secondary | ICD-10-CM | POA: Diagnosis not present

## 2020-11-13 DIAGNOSIS — E113311 Type 2 diabetes mellitus with moderate nonproliferative diabetic retinopathy with macular edema, right eye: Secondary | ICD-10-CM | POA: Diagnosis not present

## 2020-11-13 DIAGNOSIS — E113392 Type 2 diabetes mellitus with moderate nonproliferative diabetic retinopathy without macular edema, left eye: Secondary | ICD-10-CM | POA: Diagnosis not present

## 2020-11-13 DIAGNOSIS — G4733 Obstructive sleep apnea (adult) (pediatric): Secondary | ICD-10-CM

## 2020-11-13 DIAGNOSIS — Z794 Long term (current) use of insulin: Secondary | ICD-10-CM

## 2020-11-13 DIAGNOSIS — Z9989 Dependence on other enabling machines and devices: Secondary | ICD-10-CM

## 2020-11-13 MED ORDER — BEVACIZUMAB 2.5 MG/0.1ML IZ SOSY
2.5000 mg | PREFILLED_SYRINGE | INTRAVITREAL | Status: AC | PRN
  Administered 2020-11-13: 2.5 mg via INTRAVITREAL

## 2020-11-13 MED ORDER — FLUORESCEIN SODIUM 10 % IV SOLN
500.0000 mg | INTRAVENOUS | Status: AC | PRN
  Administered 2020-11-13: 500 mg via INTRAVENOUS

## 2020-11-13 NOTE — Assessment & Plan Note (Addendum)
Findings today with bilateral clinically significant macular edema in the perifoveal location worse since last visit.  We will suggest an offer fluorescein angiography today in order to evaluate for treatability of this condition and look for signs of retinal nonperfusion in the periphery   CSME confirmed OS, for intravitreal Avastin today to resolve this center threatening CSME

## 2020-11-13 NOTE — Progress Notes (Signed)
11/13/2020     CHIEF COMPLAINT Patient presents for Retina Follow Up (4 MO FU OU///Pt reports stable vision OU. Pt denies any new F/F, pain, or pressure OU. ////Last A1C: 11.5 taken 10/30/20                             Last BS: 113 this AM )   HISTORY OF PRESENT ILLNESS: Fernando Hess is a 75 y.o. male who presents to the clinic today for:   HPI    Retina Follow Up    Patient presents with  Diabetic Retinopathy.  In right eye.  This started 4 months ago.  Duration of 4 months.  Since onset it is stable. Additional comments: 4 MO FU OU   Pt reports stable vision OU. Pt denies any new F/F, pain, or pressure OU.     Last A1C: 11.5 taken 10/30/20                             Last BS: 113 this AM        Last edited by Nichola Sizer D on 11/13/2020  8:11 AM. (History)      Referring physician: [name], MD [place],  [place] [address]  HISTORICAL INFORMATION:   Selected notes from the MEDICAL RECORD NUMBER    Lab Results  Component Value Date   HGBA1C 11.1 (H) 06/11/2020     CURRENT MEDICATIONS: No current outpatient medications on file. (Ophthalmic Drugs)   No current facility-administered medications for this visit. (Ophthalmic Drugs)   Current Outpatient Medications (Other)  Medication Sig  . atorvastatin (LIPITOR) 40 MG tablet Take 40 mg by mouth daily.   . B-D UF III MINI PEN NEEDLES 31G X 5 MM MISC USE AS DIRECTED WITH LANTUS SOLOSTAR  . busPIRone (BUSPAR) 10 MG tablet Take 1 tablet (10 mg total) by mouth at bedtime.  . DULoxetine (CYMBALTA) 30 MG capsule Take 30 mg by mouth at bedtime.  . DULoxetine (CYMBALTA) 60 MG capsule Take 60 mg by mouth daily.   [name] ELIQUIS 5 MG TABS tablet TAKE 1 TABLET BY MOUTH TWICE A DAY  . esomeprazole (NEXIUM) 20 MG capsule Take 20 mg by mouth as needed.  . furosemide (LASIX) 20 MG tablet Take 20 mg by mouth as needed for shortness of breath or edema.      . Insulin Glargine (BASAGLAR KWIKPEN) 100 UNIT/ML  SOPN Inject 31 Units into the skin daily.   [name] MAGNESIUM-ZINC PO Take 1 tablet by mouth daily.  . Melatonin 3 MG TABS Take 1 tablet by mouth daily as needed (for sleep).   . metoprolol succinate (TOPROL-XL) 25 MG 24 hr tablet TAKE 1/2 TABLET BY MOUTH EVERY DAY  . Multiple Vitamin (MULTIVITAMIN) tablet Take 1 tablet by mouth daily.  . nitroGLYCERIN (NITROSTAT) 0.4 MG SL tablet Place 0.4 mg under the tongue every 5 (five) minutes as needed for chest pain.  [name] nystatin ointment (MYCOSTATIN) Apply 1 application topically as needed.   . ondansetron (ZOFRAN-ODT) 4 MG disintegrating tablet Take 4 mg by mouth every 8 (eight) hours as needed for nausea or vomiting.  Glory Rosebush ULTRA test strip FOR USE OF CHEACKING BLOOD ONCE DAILY  . pantoprazole (PROTONIX) 40 MG tablet TAKE 1 TABLET BY MOUTH EVERY DAY  . pioglitazone (ACTOS) 45 MG tablet Take 45 mg by mouth daily.   No  current facility-administered medications for this visit. (Other)      REVIEW OF SYSTEMS:    ALLERGIES Allergies  Allergen Reactions  . Levaquin [Levofloxacin] Itching and Rash  . Lisinopril Itching and Rash  . Victoza [Liraglutide]     Severe fatigue & insomnia    PAST MEDICAL HISTORY Past Medical History:  Diagnosis Date  . Acute renal failure (ARF) ([place]) 09/24/2015  . Atrial premature complexes   . CAD (coronary artery disease) of artery bypass graft    Early occlusion of saphenous vein graft to intermediate and marginal branch in February 2007 following bypass grafting   . CAD (coronary artery disease), native coronary artery cardiologist-- dr Raliegh Ip. nelson   hx NSTEMI 09-24-2015  s/p  CABG x5 on 10-01-2017;  post op STEMI inferolateral wall,  SVG OM1 and SVG OM2 occluded, distal OM occlusion the calpruit, treated medically // Myoview 7/21: no ischemia, EF 65, low risk   . CKD (chronic kidney disease), stage III ([place])   . Elevated troponin   . Erectile dysfunction   . Esophageal reflux   . History of atrial fibrillation     post op CABG 10-02-2015  . History of kidney stones   . History of kidney stones   . History of non-ST elevation myocardial infarction (NSTEMI) 09/24/2015   s/p  CABG x5  . History of ST elevation myocardial infarction (STEMI) 10/22/2015   inferior wall,  post op CABG 10-02-2015  . Hyperlipidemia   . Hypertension   . Kidney stone    h/o  . Left ureteral stone   . Mild atherosclerosis of both carotid arteries   . Nephrolithiasis    per CT bilateral non-obstructive calculi  . OSA (obstructive sleep apnea)   . Peripheral artery disease    LE Arterial US 01/2019: R PTA and ATA occluded; L ATA occluded  . RBBB (right bundle branch block)   . Renal atrophy, right   . Sleep apnea    wears cpap   . ST elevation myocardial infarction (STEMI) of inferior wall ([place]) 10/22/2015  . Type 2 diabetes mellitus treated with insulin ([place])    followed by pcp  . 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 Wears glasses    Past Surgical History:  Procedure Laterality Date  . APPENDECTOMY  1965  . CARDIAC CATHETERIZATION N/A 09/26/2015   Procedure: Left Heart Cath and Coronary Angiography;  Surgeon: Troy Sine, MD;  Location: [facility];  Service: Cardiovascular;  Laterality: N/A;  . CARDIAC CATHETERIZATION N/A 10/22/2015   Procedure: Left Heart Cath and Coronary Angiography;  Surgeon: Sherren Mocha, MD;  Location: [facility];  Service: Cardiovascular;  Laterality: N/A;  . CATARACT EXTRACTION W/ INTRAOCULAR LENS  IMPLANT, BILATERAL  2017  . COLONOSCOPY    . CORONARY ARTERY BYPASS GRAFT N/A 10/02/2015   Procedure: CORONARY ARTERY BYPASS GRAFTING (CABG) X5 LIMA-LAD; SVG-DIAG; SVG-OM; SVG-PD; SVG-RAMUS TRANSESOPHAGEAL ECHOCARDIOGRAM (TEE) ENDOSCOPIC GREATER SAPHENOUS VEIN HARVEST BILAT LE;  Surgeon: Ivin Poot, MD;  Location: [facility];  Service: Open Heart Surgery;  Laterality: N/A;  .  CYSTOSCOPY/URETEROSCOPY/HOLMIUM LASER/STENT PLACEMENT Left 08/10/2018   Procedure: CYSTOSCOPY/URETEROSCOPY/HOLMIUM LASER/STENT PLACEMENT;  Surgeon: Festus Aloe, MD;  Location[facility];  Service: Urology;  Laterality: Left;  . CYSTOSCOPY/URETEROSCOPY/HOLMIUM LASER/STENT PLACEMENT Left 09/10/2018   Procedure: CYSTOSCOPY/URETEROSCOPY/HOLMIUM LASER/STENT EXCHANGE;  Surgeon: Festus Aloe, MD;  Location: [facility];  Service: Urology;  Laterality: Left;  .  LEFT HEART CATHETERIZATION WITH CORONARY ANGIOGRAM N/A 04/13/2014   Procedure: LEFT HEART CATHETERIZATION WITH CORONARY ANGIOGRAM;  Surgeon: Jacolyn Reedy, MD;  Location: [facility];  Service: Cardiovascular;  Laterality: N/A;  . LEG SURGERY Right age 51   closed reduction leg fracture  . POLYPECTOMY    . TEE WITHOUT CARDIOVERSION N/A 10/02/2015   Procedure: TRANSESOPHAGEAL ECHOCARDIOGRAM (TEE);  Surgeon: Ivin Poot, MD;  Location: [facility];  Service: Open Heart Surgery;  Laterality: N/A;  . URETEROSCOPY WITH HOLMIUM LASER LITHOTRIPSY Bilateral 2004;  2005  dr grapey  @[facility]   . VASECTOMY      FAMILY HISTORY Family History  Problem Relation Age of Onset  . Heart attack Mother   . Heart attack Father   . Diabetes Brother   . Pancreatic cancer Brother   . Diabetes Brother   . Colon cancer Neg Hx   . Esophageal cancer Neg Hx   . Prostate cancer Neg Hx   . Rectal cancer Neg Hx   . Stomach cancer Neg Hx   . Colon polyps Neg Hx     SOCIAL HISTORY Social History   Tobacco Use  . Smoking status: Never Smoker  . Smokeless tobacco: Never Used  Vaping Use  . Vaping Use: Never used  Substance Use Topics  . Alcohol use: Not Currently  . Drug use: Never         OPHTHALMIC EXAM: Base Eye Exam    Visual Acuity (ETDRS)      Right Left   Dist cc 20/30 -2 20/30 -1   Dist ph cc NI NI   Correction: Glasses       Tonometry (Tonopen, 8:16 AM)      Right Left   Pressure 14 18       Pupils      Pupils Dark  Light Shape React APD   Right PERRL 6 6 Round Minimal None   Left PERRL 6 6 Round Minimal None       Visual Fields (Counting fingers)      Left Right    Full Full       Extraocular Movement      Right Left    Full Full       Neuro/Psych    Oriented x3: Yes   Mood/Affect: Normal       Dilation    Both eyes: 1.0% Mydriacyl, 2.5% Phenylephrine @ 8:16 AM        Slit Lamp and Fundus Exam    External Exam      Right Left   External Normal Normal       Slit Lamp Exam      Right Left   Lids/Lashes Normal Normal   Conjunctiva/Sclera White and quiet White and quiet   Cornea Clear Clear   Anterior Chamber Deep and quiet Deep and quiet   Iris Round and reactive Round and reactive   Lens Posterior chamber intraocular lens Posterior chamber intraocular lens   Anterior Vitreous Normal Normal       Fundus Exam      Right Left   Posterior Vitreous Posterior vitreous detachment Normal   Disc Normal Normal   C/D Ratio 0.5 0.55   Macula Macular thickening superotemporal to fovea., Microaneurysms, Exudates Macular thickening, Microaneurysms   Vessels NPDR- Moderate NPDR- Moderate   Periphery Choroidal nevus, small in macula,, stable, 2 dd size, flat Dot-blot hemorrhage          IMAGING AND PROCEDURES  Imaging and  Procedures for 11/13/20  OCT, Retina - OU - Both Eyes       Right Eye Quality was good. Scan locations included subfoveal. Central Foveal Thickness: 271. Progression has worsened. Findings include abnormal foveal contour, cystoid macular edema.   Left Eye Quality was good. Scan locations included subfoveal. Central Foveal Thickness: 281. Progression has been stable. Findings include abnormal foveal contour, cystoid macular edema.   Notes CSME OD, superotemporal to the fovea.  With thickening threatening the fovea OS with CSME nasal to the FAZ threatening the fovea       Color Fundus Photography Optos - OU - Both Eyes       Right Eye Progression has  worsened. Disc findings include normal observations. Vessels : normal observations. Periphery : normal observations.   Left Eye Progression has worsened. Disc findings include normal observations. Macula : edema. Vessels : normal observations. Periphery : normal observations.   Notes Moderate nonproliferative diabetic retinopathy OU       Fluorescein Angiography Optos (Transit OS)       Injection:  500 mg Fluorescein Sodium 10 % injection   NDC: [phone]   Route: IntravenousRight Eye Mid/Late phase findings include microaneurysm. Choroidal neovascularization is not present.   Left Eye   Early phase findings include leakage, microaneurysm. Mid/Late phase findings include microaneurysm, leakage. Choroidal neovascularization is not present.   Notes Bilateral perifoveal leakages, CSME OU with moderate nonproliferative diabetic retinopathy OU  No extensive retinal capillary nonperfusion peripherally present today       Intravitreal Injection, Pharmacologic Agent - OS - Left Eye       Time Out 11/13/2020. 9:57 AM. Confirmed correct patient, procedure, site, and patient consented.   Anesthesia Anesthetic medications included Akten 3.5%.   Procedure Preparation included 5% betadine to ocular surface, 10% betadine to eyelids. A 30 gauge needle was used.   Injection:  2.5 mg Bevacizumab (AVASTIN) 2.5mg /0.67mL SOSY   NDC: 94174-081-44, Lot: [id number]   Route: Intravitreal, Site: Left Eye  Post-op Post injection exam found visual acuity of at least counting fingers. The patient tolerated the procedure well. There were no complications. The patient received written and verbal post procedure care education. Post injection medications were not given.                 ASSESSMENT/PLAN:  Moderate nonproliferative diabetic retinopathy of left eye with macular edema associated with type 2 diabetes mellitus ([name]) Findings today with bilateral clinically significant macular  edema in the perifoveal location worse since last visit.  We will suggest an offer fluorescein angiography today in order to evaluate for treatability of this condition and look for signs of retinal nonperfusion in the periphery   CSME confirmed OS, for intravitreal Avastin today to resolve this center threatening CSME  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 ([place]) We will plan follow-up next week for intravitreal Avastin OD for center threatening CSME  OSA on CPAP Patient reports good compliance with CPAP although he had some days in the recent weeks where he was ill and unable to use it effectively due to sinus congestion      ICD-10-CM   1. 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 associated with type 2 diabetes mellitus ([name])  E11.3311 OCT, Retina - OU - Both Eyes    Color Fundus Photography Optos - OU - Both Eyes  3. Type 2 diabetes mellitus with left eye affected by moderate nonproliferative retinopathy without macular edema, with long-term current use of insulin ([name])  C14.4818 OCT, Retina - OU - Both Eyes   Z79.4 Color Fundus Photography Optos - OU - Both Eyes  4. OSA on CPAP  G47.33    Z99.89     1.  Center threatening CSME discovered on OCT and clinical examination today with exudate deposits suggesting risk of progression from diabetic CSME fundus fluorescein angiography recommended and performed.   2.  Treatable CSME confirmed OU.  Intravitreal Avastin OS delivered today  3.  Dilate OD next in 1 week  Ophthalmic Meds Ordered  this visit:  Meds ordered this encounter  Medications  . Fluorescein Sodium 10 % injection 500 mg  . bevacizumab (AVASTIN) SOSY 2.5 mg       Return in about 1 week (around 11/20/2020) for dilate, OD, AVASTIN OCT.  There are no Patient Instructions on file for this visit.   Explained the diagnoses, plan, and follow up with the patient and they expressed understanding.  Patient expressed understanding of the importance of proper follow up care.   Clent Demark Rankin M.D. Diseases & Surgery of the Retina and Vitreous Retina & Diabetic [place] 11/13/20     Abbreviations: M myopia (nearsighted); A astigmatism; H hyperopia (farsighted); P presbyopia; Mrx spectacle prescription;  CTL contact lenses; OD right eye; OS left eye; OU both eyes  XT exotropia; ET esotropia; PEK punctate epithelial keratitis; PEE punctate epithelial erosions; DES dry eye syndrome; MGD meibomian gland dysfunction; ATs artificial tears; PFAT's preservative free artificial tears; [place] nuclear sclerotic cataract; PSC posterior subcapsular cataract; ERM epi-retinal membrane; PVD posterior vitreous detachment; RD retinal detachment; DM diabetes mellitus; DR diabetic retinopathy; NPDR non-proliferative diabetic retinopathy; PDR proliferative diabetic retinopathy; CSME clinically significant macular edema; DME diabetic macular edema; dbh dot blot hemorrhages; CWS cotton wool spot; POAG primary open angle glaucoma; C/D cup-to-disc ratio; HVF humphrey visual field; GVF goldmann visual field; OCT optical coherence tomography; IOP intraocular pressure; BRVO Branch retinal vein occlusion; CRVO central retinal vein occlusion; CRAO central retinal artery occlusion; BRAO branch retinal artery occlusion; RT retinal tear; SB scleral buckle; PPV pars plana vitrectomy; VH Vitreous hemorrhage; PRP panretinal laser photocoagulation; IVK intravitreal kenalog; VMT vitreomacular traction; MH Macular hole;  NVD neovascularization of the disc; NVE  neovascularization elsewhere; AREDS age related eye disease study; ARMD age related macular degeneration; POAG primary open angle glaucoma; EBMD epithelial/anterior basement membrane dystrophy; ACIOL anterior chamber intraocular lens; IOL intraocular lens; PCIOL posterior chamber intraocular lens; Phaco/IOL phacoemulsification with intraocular lens placement; [place] photorefractive keratectomy; LASIK laser assisted in situ keratomileusis; HTN hypertension; DM diabetes mellitus; COPD chronic obstructive pulmonary disease

## 2020-11-13 NOTE — Assessment & Plan Note (Signed)
We will plan follow-up next week for intravitreal Avastin OD for center threatening CSME

## 2020-11-13 NOTE — Assessment & Plan Note (Signed)
Patient reports good compliance with CPAP although he had some days in the recent weeks where he was ill and unable to use it effectively due to sinus congestion

## 2020-11-20 ENCOUNTER — Ambulatory Visit (INDEPENDENT_AMBULATORY_CARE_PROVIDER_SITE_OTHER): Payer: Medicare Other | Admitting: Ophthalmology

## 2020-11-20 ENCOUNTER — Other Ambulatory Visit: Payer: Self-pay

## 2020-11-20 ENCOUNTER — Encounter (INDEPENDENT_AMBULATORY_CARE_PROVIDER_SITE_OTHER): Payer: Self-pay | Admitting: Ophthalmology

## 2020-11-20 DIAGNOSIS — E113311 Type 2 diabetes mellitus with moderate nonproliferative diabetic retinopathy with macular edema, right eye: Secondary | ICD-10-CM

## 2020-11-20 DIAGNOSIS — E113312 Type 2 diabetes mellitus with moderate nonproliferative diabetic retinopathy with macular edema, left eye: Secondary | ICD-10-CM | POA: Diagnosis not present

## 2020-11-20 MED ORDER — BEVACIZUMAB 2.5 MG/0.1ML IZ SOSY
2.5000 mg | PREFILLED_SYRINGE | INTRAVITREAL | Status: AC | PRN
  Administered 2020-11-20: 2.5 mg via INTRAVITREAL

## 2020-11-20 NOTE — Progress Notes (Signed)
11/20/2020     CHIEF COMPLAINT Patient presents for Retina Follow Up (1 Wk F/U OD, poss Avastin OD//Pt denies noticeable changes to [place] OU since last visit. Pt denies ocular pain, flashes of light, or floaters OU. //LBS: 153 this AM)   HISTORY OF PRESENT ILLNESS: Fernando Hess is a 75 y.o. male who presents to the clinic today for:   HPI    Retina Follow Up    Patient presents with  Diabetic Retinopathy.  In right eye.  This started 1 week ago.  Severity is mild.  Duration of 1 week.  Since onset it is stable. Additional comments: 1 Wk F/U OD, poss Avastin OD  Pt denies noticeable changes to [place] OU since last visit. Pt denies ocular pain, flashes of light, or floaters OU.   LBS: 153 this AM       Last edited by Rockie Neighbours, Sanders on 11/20/2020  8:11 AM. (History)      Referring physician: [name], MD [place],  [place] [id number]  HISTORICAL INFORMATION:   Selected notes from the MEDICAL RECORD NUMBER    Lab Results  Component Value Date   HGBA1C 11.1 (H) 06/11/2020     CURRENT MEDICATIONS: No current outpatient medications on file. (Ophthalmic Drugs)   No current facility-administered medications for this visit. (Ophthalmic Drugs)   Current Outpatient Medications (Other)  Medication Sig  . atorvastatin (LIPITOR) 40 MG tablet Take 40 mg by mouth daily.   . B-D UF III MINI PEN NEEDLES 31G X 5 MM MISC USE AS DIRECTED WITH LANTUS SOLOSTAR  . busPIRone (BUSPAR) 10 MG tablet Take 1 tablet (10 mg total) by mouth at bedtime.  . DULoxetine (CYMBALTA) 30 MG capsule Take 30 mg by mouth at bedtime.  . DULoxetine (CYMBALTA) 60 MG capsule Take 60 mg by mouth daily.   [name] ELIQUIS 5 MG TABS tablet TAKE 1 TABLET BY MOUTH TWICE A DAY  . esomeprazole (NEXIUM) 20 MG capsule Take 20 mg by mouth as needed.  . furosemide (LASIX) 20 MG tablet Take 20 mg by mouth as needed for shortness of breath or edema.      . Insulin Glargine (BASAGLAR KWIKPEN) 100 UNIT/ML SOPN  Inject 31 Units into the skin daily.   [name] MAGNESIUM-ZINC PO Take 1 tablet by mouth daily.  . Melatonin 3 MG TABS Take 1 tablet by mouth daily as needed (for sleep).   . metoprolol succinate (TOPROL-XL) 25 MG 24 hr tablet TAKE 1/2 TABLET BY MOUTH EVERY DAY  . Multiple Vitamin (MULTIVITAMIN) tablet Take 1 tablet by mouth daily.  . nitroGLYCERIN (NITROSTAT) 0.4 MG SL tablet Place 0.4 mg under the tongue every 5 (five) minutes as needed for chest pain.  [name] nystatin ointment (MYCOSTATIN) Apply 1 application topically as needed.   . ondansetron (ZOFRAN-ODT) 4 MG disintegrating tablet Take 4 mg by mouth every 8 (eight) hours as needed for nausea or vomiting.  Glory Rosebush ULTRA test strip FOR USE OF CHEACKING BLOOD ONCE DAILY  . pantoprazole (PROTONIX) 40 MG tablet TAKE 1 TABLET BY MOUTH EVERY DAY  . pioglitazone (ACTOS) 45 MG tablet Take 45 mg by mouth daily.   No current facility-administered medications for this visit. (Other)      REVIEW OF SYSTEMS:    ALLERGIES Allergies  Allergen Reactions  . Levaquin [Levofloxacin] Itching and Rash  . Lisinopril Itching and Rash  . Victoza [Liraglutide]     Severe fatigue & insomnia  PAST MEDICAL HISTORY Past Medical History:  Diagnosis Date  . Acute renal failure (ARF) ([place]) 09/24/2015  . Atrial premature complexes   . CAD (coronary artery disease) of artery bypass graft    Early occlusion of saphenous vein graft to intermediate and marginal branch in February 2007 following bypass grafting   . CAD (coronary artery disease), native coronary artery cardiologist-- dr Raliegh Ip. nelson   hx NSTEMI 09-24-2015  s/p  CABG x5 on 10-01-2017;  post op STEMI inferolateral wall,  SVG OM1 and SVG OM2 occluded, distal OM occlusion the calpruit, treated medically // Myoview 7/21: no ischemia, EF 65, low risk   . CKD (chronic kidney disease), stage III ([place])   . Elevated troponin   . Erectile dysfunction   . Esophageal reflux   . History of atrial fibrillation     post op CABG 10-02-2015  . History of kidney stones   . History of kidney stones   . History of non-ST elevation myocardial infarction (NSTEMI) 09/24/2015   s/p  CABG x5  . History of ST elevation myocardial infarction (STEMI) 10/22/2015   inferior wall,  post op CABG 10-02-2015  . Hyperlipidemia   . Hypertension   . Kidney stone    h/o  . Left ureteral stone   . Mild atherosclerosis of both carotid arteries   . Nephrolithiasis    per CT bilateral non-obstructive calculi  . OSA (obstructive sleep apnea)   . Peripheral artery disease    LE Arterial US 01/2019: R PTA and ATA occluded; L ATA occluded  . RBBB (right bundle branch block)   . Renal atrophy, right   . Sleep apnea    wears cpap   . ST elevation myocardial infarction (STEMI) of inferior wall ([place]) 10/22/2015  . Type 2 diabetes mellitus treated with insulin ([place])    followed by pcp  . 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 Wears glasses    Past Surgical History:  Procedure Laterality Date  . APPENDECTOMY  1965  . CARDIAC CATHETERIZATION N/A 09/26/2015   Procedure: Left Heart Cath and Coronary Angiography;  Surgeon: Troy Sine, MD;  Location: [facility];  Service: Cardiovascular;  Laterality: N/A;  . CARDIAC CATHETERIZATION N/A 10/22/2015   Procedure: Left Heart Cath and Coronary Angiography;  Surgeon: Sherren Mocha, MD;  Location: [facility];  Service: Cardiovascular;  Laterality: N/A;  . CATARACT EXTRACTION W/ INTRAOCULAR LENS  IMPLANT, BILATERAL  2017  . COLONOSCOPY    . CORONARY ARTERY BYPASS GRAFT N/A 10/02/2015   Procedure: CORONARY ARTERY BYPASS GRAFTING (CABG) X5 LIMA-LAD; SVG-DIAG; SVG-OM; SVG-PD; SVG-RAMUS TRANSESOPHAGEAL ECHOCARDIOGRAM (TEE) ENDOSCOPIC GREATER SAPHENOUS VEIN HARVEST BILAT LE;  Surgeon: Ivin Poot, MD;  Location: [facility];  Service: Open Heart Surgery;  Laterality: N/A;  .  CYSTOSCOPY/URETEROSCOPY/HOLMIUM LASER/STENT PLACEMENT Left 08/10/2018   Procedure: CYSTOSCOPY/URETEROSCOPY/HOLMIUM LASER/STENT PLACEMENT;  Surgeon: Festus Aloe, MD;  Location: [facility];  Service: Urology;  Laterality: Left;  . CYSTOSCOPY/URETEROSCOPY/HOLMIUM LASER/STENT PLACEMENT Left 09/10/2018   Procedure: CYSTOSCOPY/URETEROSCOPY/HOLMIUM LASER/STENT EXCHANGE;  Surgeon: Festus Aloe, MD;  Location: [facility];  Service: Urology;  Laterality: Left;  . LEFT HEART CATHETERIZATION WITH CORONARY ANGIOGRAM N/A 04/13/2014   Procedure: LEFT HEART CATHETERIZATION WITH CORONARY ANGIOGRAM;  Surgeon: Jacolyn Reedy, MD;  Location: [facility];  Service: Cardiovascular;  Laterality: N/A;  . LEG SURGERY Right age 36   closed reduction leg fracture  . POLYPECTOMY    .  TEE WITHOUT CARDIOVERSION N/A 10/02/2015   Procedure: TRANSESOPHAGEAL ECHOCARDIOGRAM (TEE);  Surgeon: Ivin Poot, MD;  Location: [facility];  Service: Open Heart Surgery;  Laterality: N/A;  . URETEROSCOPY WITH HOLMIUM LASER LITHOTRIPSY Bilateral 2004;  2005  dr grapey  @[facility]   . VASECTOMY      FAMILY HISTORY Family History  Problem Relation Age of Onset  . Heart attack Mother   . Heart attack Father   . Diabetes Brother   . Pancreatic cancer Brother   . Diabetes Brother   . Colon cancer Neg Hx   . Esophageal cancer Neg Hx   . Prostate cancer Neg Hx   . Rectal cancer Neg Hx   . Stomach cancer Neg Hx   . Colon polyps Neg Hx     SOCIAL HISTORY Social History   Tobacco Use  . Smoking status: Never Smoker  . Smokeless tobacco: Never Used  Vaping Use  . Vaping Use: Never used  Substance Use Topics  . Alcohol use: Not Currently  . Drug use: Never         OPHTHALMIC EXAM: Base Eye Exam    Visual Acuity (ETDRS)      Right Left   Dist cc 20/30 20/30   Dist ph cc NI    Correction: Glasses       Tonometry (Tonopen, 8:11 AM)      Right Left   Pressure 13 15       Pupils      Pupils Dark Light  Shape React APD   Right PERRL 5 5 Round Minimal None   Left PERRL 5 5 Round Minimal None       Visual Fields (Counting fingers)      Left Right    Full Full       Extraocular Movement      Right Left    Full Full       Neuro/Psych    Oriented x3: Yes   Mood/Affect: Normal       Dilation    Right eye: 1.0% Mydriacyl, 2.5% Phenylephrine @ 8:14 AM        Slit Lamp and Fundus Exam    External Exam      Right Left   External Normal Normal       Slit Lamp Exam      Right Left   Lids/Lashes Normal Normal   Conjunctiva/Sclera White and quiet White and quiet   Cornea Clear Clear   Anterior Chamber Deep and quiet Deep and quiet   Iris Round and reactive Round and reactive   Lens Posterior chamber intraocular lens Posterior chamber intraocular lens   Anterior Vitreous Normal Normal       Fundus Exam      Right Left   Posterior Vitreous Posterior vitreous detachment    Disc Normal    C/D Ratio 0.5    Macula Macular thickening superotemporal to fovea., Microaneurysms, Exudates    Vessels NPDR- Moderate    Periphery Choroidal nevus, small in macula,, stable, 2 dd size, flat           IMAGING AND PROCEDURES  Imaging and Procedures for 11/20/20  OCT, Retina - OU - Both Eyes       Right Eye Quality was good. Scan locations included subfoveal. Central Foveal Thickness: 272. Progression has been stable. Findings include abnormal foveal contour.   Left Eye Quality was good. Scan locations included subfoveal. Central Foveal Thickness: 276. Progression has been stable.  Notes Bilateral CSME with perifoveal thickening, center involved, 1 week post injection left eye, for injection Avastin OD today       Intravitreal Injection, Pharmacologic Agent - OD - Right Eye       Time Out 11/20/2020. 9:00 AM. Confirmed correct patient, procedure, site, and patient consented.   Anesthesia Topical anesthesia was used. Anesthetic medications included [place] 3.5%.    Procedure Preparation included 5% betadine to ocular surface, 10% betadine to eyelids, Tobramycin 0.3%. A 30 gauge needle was used.   Injection:  2.5 mg Bevacizumab (AVASTIN) 2.5mg /0.57mL SOSY   NDC: 63875-643-32, Lot: [id number]   Route: Intravitreal, Site: Right Eye  Post-op Post injection exam found visual acuity of at least counting fingers. The patient tolerated the procedure well. There were no complications. The patient received written and verbal post procedure care education. Post injection medications were not given.                 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  Will need repeat injection Avastin today and follow-up possible repeat focal next  Moderate nonproliferative diabetic retinopathy of left eye with macular edema associated with type 2 diabetes mellitus ([name]) Stable OS 1 week post recent injection  Avastin      ICD-10-CM   1. 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 Moderate nonproliferative diabetic retinopathy of left eye with macular edema associated with type 2 diabetes mellitus ([name])  Y60.6301     1.  1 week post injection Avastin OS force perifoveal CSME stable, follow-up left eye as scheduled  2.  OD, for center involved CSME, active, repeat injection Avastin today  3.  Follow-up 1 week dilate OD and OCT guided focal laser treatment for focal sites of leakage superotemporal and superior to the fovea OD  Ophthalmic Meds Ordered this visit:  Meds ordered this encounter  Medications  . bevacizumab (AVASTIN) SOSY 2.5 mg        Return in about 1 week (around 11/27/2020) for dilate, OD, OCT, FOCAL.  There are no Patient Instructions on file for this visit.   Explained the diagnoses, plan, and follow up with the patient and they expressed understanding.  Patient expressed understanding of the importance of proper follow up care.   Clent Demark Rankin M.D. Diseases & Surgery of the Retina and Vitreous Retina & Diabetic [place] 11/20/20     Abbreviations: M myopia (nearsighted); A astigmatism; H hyperopia (farsighted); P presbyopia; Mrx spectacle prescription;  CTL contact lenses; OD right eye; OS left eye; OU both eyes  XT exotropia; ET esotropia; PEK punctate epithelial keratitis; PEE punctate epithelial erosions; DES dry eye syndrome; MGD meibomian gland dysfunction; ATs artificial tears; PFAT's preservative free artificial tears; [place] nuclear sclerotic cataract; PSC posterior subcapsular cataract; ERM epi-retinal membrane; PVD posterior vitreous detachment; RD retinal detachment; DM diabetes mellitus; DR diabetic retinopathy; NPDR non-proliferative diabetic retinopathy; PDR proliferative diabetic retinopathy; CSME clinically significant macular edema; DME diabetic macular edema; dbh dot blot hemorrhages; CWS cotton wool spot; POAG primary open angle glaucoma; C/D cup-to-disc ratio; HVF humphrey visual field; GVF goldmann visual field; OCT optical coherence  tomography; IOP intraocular pressure; BRVO Branch retinal vein occlusion; CRVO central retinal vein occlusion; CRAO central retinal artery occlusion; BRAO branch retinal artery occlusion; RT retinal tear; SB scleral buckle; PPV pars plana vitrectomy; VH Vitreous hemorrhage; PRP panretinal laser photocoagulation; IVK intravitreal kenalog; VMT vitreomacular traction; MH Macular hole;  NVD neovascularization of the disc; NVE neovascularization elsewhere; AREDS age related eye disease study; ARMD age related macular degeneration; POAG primary open angle glaucoma; EBMD  epithelial/anterior basement membrane dystrophy; ACIOL anterior chamber intraocular lens; IOL intraocular lens; PCIOL posterior chamber intraocular lens; Phaco/IOL phacoemulsification with intraocular lens placement; [place] photorefractive keratectomy; LASIK laser assisted in situ keratomileusis; HTN hypertension; DM diabetes mellitus; COPD chronic obstructive pulmonary disease

## 2020-11-20 NOTE — Assessment & Plan Note (Signed)
Stable OS 1 week post recent injection  Avastin

## 2020-11-20 NOTE — Assessment & Plan Note (Signed)
Perifoveal CSME has extended again into the center of the fovea right most recent focal laser treatment delivered 07/2020.  Will need repeat injection Avastin today and follow-up possible repeat focal next

## 2020-11-28 ENCOUNTER — Ambulatory Visit (INDEPENDENT_AMBULATORY_CARE_PROVIDER_SITE_OTHER): Payer: Medicare Other | Admitting: Ophthalmology

## 2020-11-28 ENCOUNTER — Encounter (INDEPENDENT_AMBULATORY_CARE_PROVIDER_SITE_OTHER): Payer: Self-pay | Admitting: Ophthalmology

## 2020-11-28 ENCOUNTER — Other Ambulatory Visit: Payer: Self-pay

## 2020-11-28 DIAGNOSIS — E113311 Type 2 diabetes mellitus with moderate nonproliferative diabetic retinopathy with macular edema, right eye: Secondary | ICD-10-CM | POA: Diagnosis not present

## 2020-11-28 NOTE — Progress Notes (Signed)
11/28/2020     CHIEF COMPLAINT Patient presents for Retina Follow Up (Focal OD. OCT/Pt states no changes in vision. Denies complaints./BGL: 228)   HISTORY OF PRESENT ILLNESS: Fernando Hess is a 75 y.o. male who presents to the clinic today for:   HPI    Retina Follow Up    Patient presents with  Diabetic Retinopathy.  In right eye.  Severity is moderate.  Duration of 1 week.  Since onset it is stable.  I, the attending physician,  performed the HPI with the patient and updated documentation appropriately. Additional comments: Focal OD. OCT Pt states no changes in vision. Denies complaints. BGL: 228       Last edited by [name] on 11/28/2020  8:24 AM. (History)      Referring physician: [name], MD [place],  [place] [id number]  HISTORICAL INFORMATION:   Selected notes from the MEDICAL RECORD NUMBER    Lab Results  Component Value Date   HGBA1C 11.1 (H) 06/11/2020     CURRENT MEDICATIONS: No current outpatient medications on file. (Ophthalmic Drugs)   No current facility-administered medications for this visit. (Ophthalmic Drugs)   Current Outpatient Medications (Other)  Medication Sig  . atorvastatin (LIPITOR) 40 MG tablet Take 40 mg by mouth daily.   . B-D UF III MINI PEN NEEDLES 31G X 5 MM MISC USE AS DIRECTED WITH LANTUS SOLOSTAR  . busPIRone (BUSPAR) 10 MG tablet Take 1 tablet (10 mg total) by mouth at bedtime.  . DULoxetine (CYMBALTA) 30 MG capsule Take 30 mg by mouth at bedtime.  . DULoxetine (CYMBALTA) 60 MG capsule Take 60 mg by mouth daily.   [name] ELIQUIS 5 MG TABS tablet TAKE 1 TABLET BY MOUTH TWICE A DAY  . esomeprazole (NEXIUM) 20 MG capsule Take 20 mg by mouth as needed.  . furosemide (LASIX) 20 MG tablet Take 20 mg by mouth as needed for shortness of breath or edema.      . Insulin Glargine (BASAGLAR KWIKPEN) 100 UNIT/ML SOPN Inject 31 Units into the skin daily.   [name] MAGNESIUM-ZINC PO Take 1 tablet by mouth daily.  .  Melatonin 3 MG TABS Take 1 tablet by mouth daily as needed (for sleep).   . metoprolol succinate (TOPROL-XL) 25 MG 24 hr tablet TAKE 1/2 TABLET BY MOUTH EVERY DAY  . Multiple Vitamin (MULTIVITAMIN) tablet Take 1 tablet by mouth daily.  . nitroGLYCERIN (NITROSTAT) 0.4 MG SL tablet Place 0.4 mg under the tongue every 5 (five) minutes as needed for chest pain.  [name] nystatin ointment (MYCOSTATIN) Apply 1 application topically as needed.   . ondansetron (ZOFRAN-ODT) 4 MG disintegrating tablet Take 4 mg by mouth every 8 (eight) hours as needed for nausea or vomiting.  Glory Rosebush ULTRA test strip FOR USE OF CHEACKING BLOOD ONCE DAILY  . pantoprazole (PROTONIX) 40 MG tablet TAKE 1 TABLET BY MOUTH EVERY DAY  . pioglitazone (ACTOS) 45 MG tablet Take 45 mg by mouth daily.   No current facility-administered medications for this visit. (Other)      REVIEW OF SYSTEMS: ROS    Positive for: Endocrine   Last edited by [name] on 11/28/2020  8:24 AM. (History)       ALLERGIES Allergies  Allergen Reactions  . Levaquin [Levofloxacin] Itching and Rash  . Lisinopril Itching and Rash  . Victoza [Liraglutide]     Severe fatigue & insomnia    PAST MEDICAL HISTORY Past Medical History:  Diagnosis Date  . Acute renal failure (ARF) ([place]) 09/24/2015  . Atrial premature complexes   . CAD (coronary artery disease) of artery bypass graft    Early occlusion of saphenous vein graft to intermediate and marginal branch in February 2007 following bypass grafting   . CAD (coronary artery disease), native coronary artery cardiologist-- dr Raliegh Ip. nelson   hx NSTEMI 09-24-2015  s/p  CABG x5 on 10-01-2017;  post op STEMI inferolateral wall,  SVG OM1 and SVG OM2 occluded, distal OM occlusion the calpruit, treated medically // Myoview 7/21: no ischemia, EF 65, low risk   . CKD (chronic kidney disease), stage III ([place])   . Elevated troponin   . Erectile dysfunction   . Esophageal reflux   . History of atrial  fibrillation    post op CABG 10-02-2015  . History of kidney stones   . History of kidney stones   . History of non-ST elevation myocardial infarction (NSTEMI) 09/24/2015   s/p  CABG x5  . History of ST elevation myocardial infarction (STEMI) 10/22/2015   inferior wall,  post op CABG 10-02-2015  . Hyperlipidemia   . Hypertension   . Kidney stone    h/o  . Left ureteral stone   . Mild atherosclerosis of both carotid arteries   . Nephrolithiasis    per CT bilateral non-obstructive calculi  . OSA (obstructive sleep apnea)   . Peripheral artery disease    LE Arterial US 01/2019: R PTA and ATA occluded; L ATA occluded  . RBBB (right bundle branch block)   . Renal atrophy, right   . Sleep apnea    wears cpap   . ST elevation myocardial infarction (STEMI) of inferior wall ([place]) 10/22/2015  . Type 2 diabetes mellitus treated with insulin ([place])    followed by pcp  . 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 Wears glasses    Past Surgical History:  Procedure Laterality Date  . APPENDECTOMY  1965  . CARDIAC CATHETERIZATION N/A 09/26/2015   Procedure: Left Heart Cath and Coronary Angiography;  Surgeon: Troy Sine, MD;  Location: [facility];  Service: Cardiovascular;  Laterality: N/A;  . CARDIAC CATHETERIZATION N/A 10/22/2015   Procedure: Left Heart Cath and Coronary Angiography;  Surgeon: Sherren Mocha, MD;  Location: [facility];  Service: Cardiovascular;  Laterality: N/A;  . CATARACT EXTRACTION W/ INTRAOCULAR LENS  IMPLANT, BILATERAL  2017  . COLONOSCOPY    . CORONARY ARTERY BYPASS GRAFT N/A 10/02/2015   Procedure: CORONARY ARTERY BYPASS GRAFTING (CABG) X5 LIMA-LAD; SVG-DIAG; SVG-OM; SVG-PD; SVG-RAMUS TRANSESOPHAGEAL ECHOCARDIOGRAM (TEE) ENDOSCOPIC GREATER SAPHENOUS VEIN HARVEST BILAT LE;  Surgeon: Ivin Poot, MD;  Location: [facility];  Service: Open Heart Surgery;  Laterality: N/A;  .  CYSTOSCOPY/URETEROSCOPY/HOLMIUM LASER/STENT PLACEMENT Left 08/10/2018   Procedure: CYSTOSCOPY/URETEROSCOPY/HOLMIUM LASER/STENT PLACEMENT;  Surgeon: Festus Aloe, MD;  Location: [facility];  Service: Urology;  Laterality: Left;  . CYSTOSCOPY/URETEROSCOPY/HOLMIUM LASER/STENT PLACEMENT Left 09/10/2018   Procedure: CYSTOSCOPY/URETEROSCOPY/HOLMIUM LASER/STENT EXCHANGE;  Surgeon: Festus Aloe, MD;  Location: [facility];  Service: Urology;  Laterality: Left;  . LEFT HEART CATHETERIZATION WITH CORONARY ANGIOGRAM N/A 04/13/2014   Procedure: LEFT HEART CATHETERIZATION WITH CORONARY ANGIOGRAM;  Surgeon: Jacolyn Reedy, MD;  Location: [facility] C[facility];  Service: Cardiovascular;  Laterality: N/A;  . LEG SURGERY Right age 43   closed reduction leg fracture  . POLYPECTOMY    . TEE WITHOUT CARDIOVERSION N/A  10/02/2015   Procedure: TRANSESOPHAGEAL ECHOCARDIOGRAM (TEE);  Surgeon: Ivin Poot, MD;  Location: [facility];  Service: Open Heart Surgery;  Laterality: N/A;  . URETEROSCOPY WITH HOLMIUM LASER LITHOTRIPSY Bilateral 2004;  2005  dr grapey  @[facility]   . VASECTOMY      FAMILY HISTORY Family History  Problem Relation Age of Onset  . Heart attack Mother   . Heart attack Father   . Diabetes Brother   . Pancreatic cancer Brother   . Diabetes Brother   . Colon cancer Neg Hx   . Esophageal cancer Neg Hx   . Prostate cancer Neg Hx   . Rectal cancer Neg Hx   . Stomach cancer Neg Hx   . Colon polyps Neg Hx     SOCIAL HISTORY Social History   Tobacco Use  . Smoking status: Never Smoker  . Smokeless tobacco: Never Used  Vaping Use  . Vaping Use: Never used  Substance Use Topics  . Alcohol use: Not Currently  . Drug use: Never         OPHTHALMIC EXAM: Base Eye Exam    Visual Acuity (Snellen - Linear)      Right Left   Dist cc 20/30 20/30   Correction: Glasses       Tonometry (Tonopen, 8:27 AM)      Right Left   Pressure 12 13       Neuro/Psych    Oriented x3: Yes    Mood/Affect: Normal       Dilation    Right eye: 1.0% Mydriacyl, 2.5% Phenylephrine @ 8:27 AM        Slit Lamp and Fundus Exam    External Exam      Right Left   External Normal Normal       Slit Lamp Exam      Right Left   Lids/Lashes Normal Normal   Conjunctiva/Sclera White and quiet White and quiet   Cornea Clear Clear   Anterior Chamber Deep and quiet Deep and quiet   Iris Round and reactive Round and reactive   Lens Posterior chamber intraocular lens Posterior chamber intraocular lens   Anterior Vitreous Normal Normal          IMAGING AND PROCEDURES  Imaging and Procedures for 11/28/20  OCT, Retina - OU - Both Eyes       Right Eye Quality was good. Scan locations included subfoveal. Central Foveal Thickness: 266. Progression has been stable. Findings include abnormal foveal contour.   Left Eye Quality was good. Scan locations included subfoveal. Central Foveal Thickness: 272. Progression has been stable.   Notes CSME superotemporal to the fovea OD, proved status post recent Avastin injection  Focal grid to this region today       Focal Laser - OD - Right Eye       Time Out Confirmed correct patient, procedure, site, and patient consented.   Anesthesia Topical anesthesia was used. Anesthetic medications included Proparacaine 0.5%.   Laser Information The type of laser was diode. Color was yellow. The duration in seconds was 0.1. The spot size was 100 microns. Laser power was 50. Total spots was 28.   Post-op The patient tolerated the procedure well. There were no complications. The patient received written and verbal post procedure care education.   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  Focal laser completed today, OD  2.  Observe OU  3.  Ophthalmic Meds Ordered this visit:  No orders of the defined types were placed in this encounter.      Return in about 14 weeks (around 03/06/2021) for DILATE OU, OCT.  There are no Patient Instructions on file for this visit.   Explained the diagnoses, plan, and follow up with the patient and they expressed understanding.  Patient expressed understanding of the importance of proper follow up care.   Clent Demark Louana Fontenot M.D. Diseases & Surgery of the Retina and Vitreous Retina [facility] Diabetic [place] 11/28/20     Abbreviations: M myopia (nearsighted); A astigmatism; H hyperopia (farsighted); P presbyopia; Mrx spectacle prescription;  CTL contact lenses; OD right eye; OS left eye; OU both eyes  XT exotropia; ET esotropia; PEK punctate epithelial keratitis; PEE punctate epithelial erosions; DES dry eye syndrome; MGD meibomian gland dysfunction; ATs artificial tears; PFAT's preservative free artificial tears; [place] nuclear sclerotic cataract; PSC posterior subcapsular cataract; ERM epi-retinal membrane; PVD posterior vitreous detachment; RD retinal detachment; DM diabetes mellitus; DR diabetic retinopathy; NPDR non-proliferative diabetic retinopathy; PDR proliferative diabetic retinopathy; CSME clinically significant macular edema; DME diabetic macular edema; dbh dot blot hemorrhages; CWS cotton wool spot; POAG primary open angle glaucoma; C/D cup-to-disc ratio; HVF humphrey visual field; GVF goldmann visual field; OCT optical coherence tomography; IOP intraocular pressure; BRVO Branch retinal vein occlusion; CRVO central retinal vein occlusion; CRAO central retinal artery occlusion; BRAO branch retinal artery occlusion; RT retinal tear; SB scleral  buckle; PPV pars plana vitrectomy; VH Vitreous hemorrhage; PRP panretinal laser photocoagulation; IVK intravitreal kenalog; VMT vitreomacular traction; MH Macular hole;  NVD neovascularization of the disc; NVE neovascularization elsewhere; AREDS age related eye disease study; ARMD age related macular degeneration; POAG primary open angle glaucoma; EBMD epithelial/anterior basement membrane dystrophy; ACIOL anterior chamber intraocular lens; IOL intraocular lens; PCIOL posterior chamber intraocular lens; Phaco/IOL phacoemulsification with intraocular lens placement; [place] photorefractive keratectomy; LASIK laser assisted in situ keratomileusis; HTN hypertension; DM diabetes mellitus; COPD chronic obstructive pulmonary disease

## 2020-11-28 NOTE — Assessment & Plan Note (Signed)
CSME OD improved status post recent injection Avastin, focal grid laser treatment today

## 2020-12-04 ENCOUNTER — Encounter: Payer: Self-pay | Admitting: Cardiology

## 2020-12-04 ENCOUNTER — Ambulatory Visit (INDEPENDENT_AMBULATORY_CARE_PROVIDER_SITE_OTHER): Payer: Medicare Other | Admitting: Cardiology

## 2020-12-04 ENCOUNTER — Other Ambulatory Visit: Payer: Self-pay

## 2020-12-04 VITALS — BP 118/74 | HR 58 | Ht 64.0 in | Wt 194.2 lb

## 2020-12-04 DIAGNOSIS — I495 Sick sinus syndrome: Secondary | ICD-10-CM | POA: Diagnosis not present

## 2020-12-04 DIAGNOSIS — N184 Chronic kidney disease, stage 4 (severe): Secondary | ICD-10-CM

## 2020-12-04 DIAGNOSIS — I2510 Atherosclerotic heart disease of native coronary artery without angina pectoris: Secondary | ICD-10-CM | POA: Diagnosis not present

## 2020-12-04 NOTE — Patient Instructions (Signed)
Medication Instructions:   Your physician recommends that you continue on your current medications as directed. Please refer to the Current Medication list given to you today.  *If you need a refill on your cardiac medications before your next appointment, please call your pharmacy*   Follow-Up: At [facility], you and your health needs are our priority.  As part of our continuing mission to provide you with exceptional heart care, we have created designated Provider Care Teams.  These Care Teams include your primary Cardiologist (physician) and Advanced Practice Providers (APPs -  Physician Assistants and Nurse Practitioners) who all work together to provide you with the care you need, when you need it.  We recommend signing up for the patient portal called "MyChart".  Sign up information is provided on this After Visit Summary.  MyChart is used to connect with patients for Virtual Visits (Telemedicine).  Patients are able to view lab/test results, encounter notes, upcoming appointments, etc.  Non-urgent messages can be sent to your provider as well.   To learn more about what you can do with MyChart, go to [email or web address].    Your next appointment:   6 month(s)  The format for your next appointment:   In Person  Provider:   Heather Pemberton, MD     

## 2020-12-04 NOTE — Progress Notes (Addendum)
Cardiology Office Note:    Date:  12/04/2020   ID:  Fernando Hess, DOB 1946/07/17, MRN [id number]  PCP:  [name], MD  Cardiologist:  Ena Dawley, MD (patient request)  Referring MD: [name], MD   Reason for visit: 6 months follow-up  History of Present Illness:    Fernando Hess is a 75 y.o. male with coronary artery disease status post CABG, diabetes, hypertension, hyperlipidemia, chronic kidney disease who is being seen today to establish with Cardiology at the request of [name], MD.  Fernando Hess suffered a myocardial infarction in January 2017 and underwent multivessel CABG.  Postoperative course was complicated by atrial fibrillation treated with amiodarone.  He presented back to the hospital in 10/2015 with an inferolateral ST elevation myocardial infarction.  He had significant hyperkalemia and nausea and vomiting.  Amiodarone was stopped secondary to nausea and vomiting.  Cardiac catheterization demonstrated an occluded SVG-OM1 and occluded SVG-OM2.  His distal OM was occluded.  The distal OM occlusion was felt to be the culprit for his myocardial infarction.  He had diabetic appearing vessels and medical therapy was recommended.    The patient has been struggling with episode of falls as well as shortness of breath.  With regards to shortness of breath he tries to walk a mile every day and has to stop several times, he underwent nuclear stress test in July of this year that showed no evidence of prior infarct or ischemia.  With regards to dizziness he says that it has improved and he has not had any falls since he started to use a cane.  He saw Dr. 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 [name] recommended a loop monitor however patient is not interested.  He denies any lower extremity edema, continues to use Lasix 3 times a week, he also uses CPAP at night that helps his sleep.  He is tolerating  atorvastatin with use of magnesium.  12/04/2020 -the patient is coming after 6 months he has been, he walks 1 mile several times a week and has no chest pain and stable dyspnea on moderate exertion, he continues to have falls but they are related to hypoglycemia and mechanical falls, no presyncope or syncope.  No bleeding, no lower extremity edema, he uses Lasix rarely,  PAD Screen 06/09/2018  Previous PAD dx? No  Previous surgical procedure? No  Pain with walking? No  Feet/toe relief with dangling? No  Painful, non-healing ulcers? No  Extremities discolored? No    Prior CV studies:   The following studies were reviewed today:  Echocardiogram 10/23/2015 EF 55-60, normal wall motion, mildly calcified aortic valve leaflets, mild AI, mild to moderate MR  Cardiac catheterization 10/22/2015 LM ostial 50 LAD mid 90 RI ostial 90 LCx proximal 80; OM1 80; OM2 100 (culprit for MI) RCA mid 50 LIMA-LAD patent SVG-RPDA patent SVG-D1 patent SVG-OM1 100 SVG-OM2 100 Medical therapy  Pre-CABG Dopplers 09/28/2015 Summary: Findings suggest 1-39% internal carotid artery stenosis bilaterally. Vertebral arteries are patent with antegrade flow. Bilateral ABIs are within normal limits.   Past Medical History:  Diagnosis Date  . Acute renal failure (ARF) ([place]) 09/24/2015  . Atrial premature complexes   . CAD (coronary artery disease) of artery bypass graft    Early occlusion of saphenous vein graft to intermediate and marginal branch in February 2007 following bypass grafting   . CAD (coronary artery disease), native coronary artery cardiologist-- dr Raliegh Ip. Meda Coffee  hx NSTEMI 09-24-2015  s/p  CABG x5 on 10-01-2017;  post op STEMI inferolateral wall,  SVG OM1 and SVG OM2 occluded, distal OM occlusion the calpruit, treated medically // Myoview 7/21: no ischemia, EF 65, low risk   . CKD (chronic kidney disease), stage III ([place])   . Elevated troponin   . Erectile dysfunction   . Esophageal reflux   .  History of atrial fibrillation    post op CABG 10-02-2015  . History of kidney stones   . History of kidney stones   . History of non-ST elevation myocardial infarction (NSTEMI) 09/24/2015   s/p  CABG x5  . History of ST elevation myocardial infarction (STEMI) 10/22/2015   inferior wall,  post op CABG 10-02-2015  . Hyperlipidemia   . Hypertension   . Kidney stone    h/o  . Left ureteral stone   . Mild atherosclerosis of both carotid arteries   . Nephrolithiasis    per CT bilateral non-obstructive calculi  . OSA (obstructive sleep apnea)   . Peripheral artery disease    LE Arterial US 01/2019: R PTA and ATA occluded; L ATA occluded  . RBBB (right bundle branch block)   . Renal atrophy, right   . Sleep apnea    wears cpap   . ST elevation myocardial infarction (STEMI) of inferior wall ([place]) 10/22/2015  . Type 2 diabetes mellitus treated with insulin ([place])    followed by pcp  . 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 Wears glasses     Past Surgical History:  Procedure Laterality Date  . APPENDECTOMY  1965  . CARDIAC CATHETERIZATION N/A 09/26/2015   Procedure: Left Heart Cath and Coronary Angiography;  Surgeon: Troy Sine, MD;  Location: [facility];  Service: Cardiovascular;  Laterality: N/A;  . CARDIAC CATHETERIZATION N/A 10/22/2015   Procedure: Left Heart Cath and Coronary Angiography;  Surgeon: Sherren Mocha, MD;  Location: [facility];  Service: Cardiovascular;  Laterality: N/A;  . CATARACT EXTRACTION W/ INTRAOCULAR LENS  IMPLANT, BILATERAL  2017  . COLONOSCOPY    . CORONARY ARTERY BYPASS GRAFT N/A 10/02/2015   Procedure: CORONARY ARTERY BYPASS GRAFTING (CABG) X5 LIMA-LAD; SVG-DIAG; SVG-OM; SVG-PD; SVG-RAMUS TRANSESOPHAGEAL ECHOCARDIOGRAM (TEE) ENDOSCOPIC GREATER SAPHENOUS VEIN HARVEST BILAT LE;  Surgeon: Ivin Poot, MD;  Location: [facility];  Service: Open Heart Surgery;   Laterality: N/A;  . CYSTOSCOPY/URETEROSCOPY/HOLMIUM LASER/STENT PLACEMENT Left 08/10/2018   Procedure: CYSTOSCOPY/URETEROSCOPY/HOLMIUM LASER/STENT PLACEMENT;  Surgeon: Festus Aloe, MD;  Location: [facility];  Service: Urology;  Laterality: Left;  . CYSTOSCOPY/URETEROSCOPY/HOLMIUM LASER/STENT PLACEMENT Left 09/10/2018   Procedure: CYSTOSCOPY/URETEROSCOPY/HOLMIUM LASER/STENT EXCHANGE;  Surgeon: Festus Aloe, MD;  Location: [facility];  Service: Urology;  Laterality: Left;  . LEFT HEART CATHETERIZATION WITH CORONARY ANGIOGRAM N/A 04/13/2014   Procedure: LEFT HEART CATHETERIZATION WITH CORONARY ANGIOGRAM;  Surgeon: Jacolyn Reedy, MD;  Location: [facility];  Service: Cardiovascular;  Laterality: N/A;  . LEG SURGERY Right age 24   closed reduction leg fracture  . POLYPECTOMY    . TEE WITHOUT CARDIOVERSION N/A 10/02/2015   Procedure: TRANSESOPHAGEAL ECHOCARDIOGRAM (TEE);  Surgeon: Ivin Poot, MD;  Location: [facility];  Service: Open Heart Surgery;  Laterality: N/A;  . URETEROSCOPY WITH HOLMIUM LASER LITHOTRIPSY Bilateral 2004;  2005  dr grapey  @[email or web address]   . VASECTOMY      Current Medications: Current Meds  Medication Sig  . atorvastatin (LIPITOR) 40  MG tablet Take 40 mg by mouth daily.   . B-D UF III MINI PEN NEEDLES 31G X 5 MM MISC USE AS DIRECTED WITH LANTUS SOLOSTAR  . busPIRone (BUSPAR) 10 MG tablet Take 1 tablet (10 mg total) by mouth at bedtime.  . DULoxetine (CYMBALTA) 30 MG capsule Take 30 mg by mouth at bedtime.  . DULoxetine (CYMBALTA) 60 MG capsule Take 60 mg by mouth daily.   [name] ELIQUIS 5 MG TABS tablet TAKE 1 TABLET BY MOUTH TWICE A DAY  . esomeprazole (NEXIUM) 20 MG capsule Take 20 mg by mouth as needed.  . furosemide (LASIX) 20 MG tablet Take 20 mg by mouth as needed for shortness of breath or edema.      . Insulin Glargine (BASAGLAR KWIKPEN) 100 UNIT/ML SOPN Inject 31 Units into the skin daily.   [name] MAGNESIUM-ZINC PO Take 1 tablet by mouth daily.  . Melatonin  3 MG TABS Take 1 tablet by mouth daily as needed (for sleep).   . metoprolol succinate (TOPROL-XL) 25 MG 24 hr tablet TAKE 1/2 TABLET BY MOUTH EVERY DAY  . Multiple Vitamin (MULTIVITAMIN) tablet Take 1 tablet by mouth daily.  . nitroGLYCERIN (NITROSTAT) 0.4 MG SL tablet Place 0.4 mg under the tongue every 5 (five) minutes as needed for chest pain.  [name] nystatin ointment (MYCOSTATIN) Apply 1 application topically as needed.   . ondansetron (ZOFRAN-ODT) 4 MG disintegrating tablet Take 4 mg by mouth every 8 (eight) hours as needed for nausea or vomiting.  Glory Rosebush ULTRA test strip FOR USE OF CHEACKING BLOOD ONCE DAILY  . pantoprazole (PROTONIX) 40 MG tablet TAKE 1 TABLET BY MOUTH EVERY DAY  . pioglitazone (ACTOS) 45 MG tablet Take 45 mg by mouth daily.     Allergies:   Levaquin [levofloxacin], Lisinopril, and Victoza [liraglutide]   Social History   Socioeconomic History  . Marital status: Married    Spouse name: Not on file  . Number of children: Not on file  . Years of education: Not on file  . Highest education level: Not on file  Occupational History  . Occupation: retired  Tobacco Use  . Smoking status: Never Smoker  . Smokeless tobacco: Never Used  Vaping Use  . Vaping Use: Never used  Substance and Sexual Activity  . Alcohol use: Not Currently  . Drug use: Never  . Sexual activity: Not on file  Other Topics Concern  . 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 There is no history of Colon cancer, Esophageal cancer, Prostate  cancer, Rectal cancer, Stomach cancer, or Colon polyps.  ROS:   Please see the history of present illness.    Review of Systems  Eyes: Positive for visual disturbance.  Respiratory: Positive for cough.   Neurological: Positive for headaches.   All other systems reviewed and are negative.   EKGs/Labs/Other Test Reviewed:    EKG:  EKG is  ordered today.  The ekg ordered today demonstrates sinus rhythm, with PACs, right bundle  branch block is no longer present.  This was personally reviewed.    Recent Labs: 03/09/2020: NT-Pro BNP 670 06/11/2020: TSH 0.815 06/25/2020: B Natriuretic Peptide 161.7; BUN 32; Creatinine, Ser 2.27; Hemoglobin 14.3; Platelets 261; Potassium 4.7; Sodium 137   Recent Lipid Panel Lab Results  Component Value Date/Time   CHOL 129 06/11/2020 09:15 AM   TRIG 84 06/11/2020 09:15 AM   HDL 41 06/11/2020 09:15 AM   CHOLHDL 3.1 06/11/2020 09:15 AM   CHOLHDL 2.7 10/23/2015 05:15 AM   LDLCALC 72 06/11/2020 09:15 AM   From K[facility] Tool: Cholesterol, total 114.000 03/10/2018 HDL 50.000 03/10/2018 LDL 52.000 03/10/2018 Triglycerides 60.000 03/10/2018 A1C 8.800 03/10/2018 Hemoglobin 14.500 03/10/2018 Creatinine, Serum 2.020 04/09/2018 Potassium 5.100 04/09/2018 ALT (SGPT) 28.000 04/09/2018 TSH 0.468 10/26/2015 Platelets 173.000 12/01/2016  Physical Exam:    VS:  BP 118/74   Pulse (!) 58   Ht 5\' 4"  (1.626 m)   Wt 194 lb 3.2 oz (88.1 kg)   SpO2 95%   BMI 33.33 kg/m     Wt Readings from Last 3 Encounters:  12/04/20 194 lb 3.2 oz (88.1 kg)  08/28/20 195 lb (88.5 kg)  08/06/20 190 lb (86.2 kg)     Physical Exam Constitutional:      General: He is not in acute distress.    Appearance: He is well-developed.  HENT:     Head: Normocephalic and atraumatic.  Neck:     Thyroid: No thyromegaly.     Vascular: No carotid bruit or JVD.  Cardiovascular:     Rate and Rhythm: Normal rate and regular rhythm.     Heart sounds: Normal heart sounds. No murmur  heard.   Pulmonary:     Effort: Pulmonary effort is normal.     Breath sounds: Normal breath sounds. No rales.  Abdominal:     Palpations: Abdomen is soft. There is no hepatomegaly.  Lymphadenopathy:     Cervical: No cervical adenopathy.  Skin:    General: Skin is warm and dry.  Neurological:     Mental Status: He is alert and oriented to person, place, and time.     ASSESSMENT & PLAN:    Coronary artery disease involving coronary bypass graft of native heart without angina pectoris Hx of CABG in 2017.  He returned to the hospital several weeks later with an inferolateral STEMI.  The vein grafts to both OMs were occluded and the culprit was a distal OM occlusion.  He was treated medically.  -He underwent Lexiscan nuclear stress test in July 2021 with no evidence for infarct or ischemia, he is encouraged to continue walking.  He is currently asymptomatic, continue same management with atorvastatin, Toprol-XL, no aspirin as he is on apixaban.  Recurrent falls -Improved with use of cane.  We will obtain carotid ultrasound.  Essential hypertension  Controlled, continue current management  Hyperlipidemia, unspecified hyperlipidemia type  His lipids are at goal.  Tachy-brady syndrome Patient paroxysmal atrial fibrillation that is largely asymptomatic. He does convert to normal rhythm frequently on the zio but the post conversion pauses are short lasting only 3 seconds.  He was offered loop monitor however refused.  He is being followed by Dr. [name].  Aortic regurgitation -Mild, stable on the echo in 2021  Atrial fibrillation, paroxysmal CHADSVASc of 5 (age, DM, vascular, HTN) on eliquis. Emphasized twice daily dosing of the apixaban given he told me that he previously was taking it only once daily.  His last hemoglobin was 14. Continue metoprolol and  Eliquis, he has no bleeding.   DM2 Continue insulin, actos, consider Januvia if hemoglobin A1c elevated.  Dispo:  No follow-ups  on file.   Medication Adjustments/Labs and Tests Ordered: Current medicines are reviewed at length with the patient today.  Concerns regarding medicines are outlined above.  Orders/Tests:  No orders of the defined types were placed in this encounter.  Medication changes: No orders of the defined types were placed in this encounter.  Signed, Ena Dawley, MD  12/04/2020 8:26 AM    [address], [place], [place]  [address] Phone: [phone]; Fax: [phone]

## 2021-03-06 ENCOUNTER — Other Ambulatory Visit: Payer: Self-pay

## 2021-03-06 ENCOUNTER — Encounter (INDEPENDENT_AMBULATORY_CARE_PROVIDER_SITE_OTHER): Payer: Self-pay | Admitting: Ophthalmology

## 2021-03-06 ENCOUNTER — Ambulatory Visit (INDEPENDENT_AMBULATORY_CARE_PROVIDER_SITE_OTHER): Payer: Medicare Other | Admitting: Ophthalmology

## 2021-03-06 ENCOUNTER — Encounter (INDEPENDENT_AMBULATORY_CARE_PROVIDER_SITE_OTHER): Payer: Medicare Other | Admitting: Ophthalmology

## 2021-03-06 DIAGNOSIS — E113312 Type 2 diabetes mellitus with moderate nonproliferative diabetic retinopathy with macular edema, left eye: Secondary | ICD-10-CM | POA: Diagnosis not present

## 2021-03-06 DIAGNOSIS — E113311 Type 2 diabetes mellitus with moderate nonproliferative diabetic retinopathy with macular edema, right eye: Secondary | ICD-10-CM

## 2021-03-06 DIAGNOSIS — G4733 Obstructive sleep apnea (adult) (pediatric): Secondary | ICD-10-CM

## 2021-03-06 DIAGNOSIS — Z9989 Dependence on other enabling machines and devices: Secondary | ICD-10-CM

## 2021-03-06 DIAGNOSIS — D3131 Benign neoplasm of right choroid: Secondary | ICD-10-CM

## 2021-03-06 MED ORDER — BEVACIZUMAB 2.5 MG/0.1ML IZ SOSY
2.5000 mg | PREFILLED_SYRINGE | INTRAVITREAL | Status: AC | PRN
  Administered 2021-03-06: 2.5 mg via INTRAVITREAL

## 2021-03-06 NOTE — Assessment & Plan Note (Signed)
Excellent compliance

## 2021-03-06 NOTE — Assessment & Plan Note (Signed)
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

## 2021-03-06 NOTE — Progress Notes (Signed)
03/06/2021     CHIEF COMPLAINT Patient presents for No chief complaint on file.   HISTORY OF PRESENT ILLNESS: Fernando Hess is a 75 y.o. male who presents to the clinic today for:     Referring physician: [name], MD [place],  [place] [phone]  HISTORICAL INFORMATION:   Selected notes from the [facility]    Lab Results  Component Value Date   HGBA1C 11.1 (H) 06/11/2020     CURRENT MEDICATIONS: No current outpatient medications on file. (Ophthalmic Drugs)   No current facility-administered medications for this visit. (Ophthalmic Drugs)   Current Outpatient Medications (Other)  Medication Sig   atorvastatin (LIPITOR) 40 MG tablet Take 40 mg by mouth daily.    B-D UF III MINI PEN NEEDLES 31G X 5 MM MISC USE AS DIRECTED WITH LANTUS SOLOSTAR   busPIRone (BUSPAR) 10 MG tablet Take 1 tablet (10 mg total) by mouth at bedtime.   DULoxetine (CYMBALTA) 30 MG capsule Take 30 mg by mouth at bedtime.   DULoxetine (CYMBALTA) 60 MG capsule Take 60 mg by mouth daily.    ELIQUIS 5 MG TABS tablet TAKE 1 TABLET BY MOUTH TWICE A DAY   esomeprazole (NEXIUM) 20 MG capsule Take 20 mg by mouth as needed.   furosemide (LASIX) 20 MG tablet Take 20 mg by mouth as needed for shortness of breath or edema.       Insulin Glargine (BASAGLAR KWIKPEN) 100 UNIT/ML SOPN Inject 31 Units into the skin daily.    MAGNESIUM-ZINC PO Take 1 tablet by mouth daily.   Melatonin 3 MG TABS Take 1 tablet by mouth daily as needed (for sleep).    metoprolol succinate (TOPROL-XL) 25 MG 24 hr tablet TAKE 1/2 TABLET BY MOUTH EVERY DAY   Multiple Vitamin (MULTIVITAMIN) tablet Take 1 tablet by mouth daily.   nitroGLYCERIN (NITROSTAT) 0.4 MG SL tablet Place 0.4 mg under the tongue every 5 (five) minutes as needed for chest pain.   nystatin ointment (MYCOSTATIN) Apply 1 application topically as needed.    ondansetron (ZOFRAN-ODT) 4 MG disintegrating tablet Take 4 mg by mouth every 8  (eight) hours as needed for nausea or vomiting.   ONETOUCH ULTRA test strip FOR USE OF CHEACKING BLOOD ONCE DAILY   pantoprazole (PROTONIX) 40 MG tablet TAKE 1 TABLET BY MOUTH EVERY DAY   pioglitazone (ACTOS) 45 MG tablet Take 45 mg by mouth daily.   No current facility-administered medications for this visit. (Other)      REVIEW OF SYSTEMS:    ALLERGIES Allergies  Allergen Reactions   Levaquin [Levofloxacin] Itching and Rash   Lisinopril Itching and Rash   Victoza [Liraglutide]     Severe fatigue & insomnia    PAST MEDICAL HISTORY Past Medical History:  Diagnosis Date   Acute renal failure (ARF) ([place]) 09/24/2015   Atrial premature complexes    CAD (coronary artery disease) of artery bypass graft    Early occlusion of saphenous vein graft to intermediate and marginal branch in February 2007 following bypass grafting    CAD (coronary artery disease), native coronary artery cardiologist-- dr Liane Comber   hx NSTEMI 09-24-2015  s/p  CABG x5 on 10-01-2017;  post op STEMI inferolateral wall,  SVG OM1 and SVG OM2 occluded, distal OM occlusion the calpruit, treated medically // Myoview 7/21: no ischemia, EF 65, low risk    CKD (chronic kidney disease), stage III ([place])    Elevated troponin  Erectile dysfunction    Esophageal reflux    History of atrial fibrillation    post op CABG 10-02-2015   History of kidney stones    History of kidney stones    History of non-ST elevation myocardial infarction (NSTEMI) 09/24/2015   s/p  CABG x5   History of ST elevation myocardial infarction (STEMI) 10/22/2015   inferior wall,  post op CABG 10-02-2015   Hyperlipidemia    Hypertension    Kidney stone    h/o   Left ureteral stone    Mild atherosclerosis of both carotid arteries    Nephrolithiasis    per CT bilateral non-obstructive calculi   OSA (obstructive sleep apnea)    Peripheral artery disease    LE Arterial US 01/2019: R PTA and ATA occluded; L ATA occluded   RBBB (right bundle  branch block)    Renal atrophy, right    Sleep apnea    wears cpap    ST elevation myocardial infarction (STEMI) of inferior wall ([place]) 10/22/2015   Type 2 diabetes mellitus treated with insulin ([place])    followed by pcp   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 ([place]) 03/01/2008   Wears glasses    Past Surgical History:  Procedure Laterality Date   [employer] N/A 09/26/2015   Procedure: Left Heart Cath and Coronary Angiography;  Surgeon: Troy Sine, MD;  Location: [facility];  Service: Cardiovascular;  Laterality: N/A;   CARDIAC CATHETERIZATION N/A 10/22/2015   Procedure: Left Heart Cath and Coronary Angiography;  Surgeon: Sherren Mocha, MD;  Location: [facility];  Service: Cardiovascular;  Laterality: N/A;   CATARACT EXTRACTION W/ INTRAOCULAR LENS  IMPLANT, BILATERAL  2017   COLONOSCOPY     CORONARY ARTERY BYPASS GRAFT N/A 10/02/2015   Procedure: CORONARY ARTERY BYPASS GRAFTING (CABG) X5 LIMA-LAD; SVG-DIAG; SVG-OM; SVG-PD; SVG-RAMUS TRANSESOPHAGEAL ECHOCARDIOGRAM (TEE) ENDOSCOPIC GREATER SAPHENOUS VEIN  HARVEST BILAT LE;  Surgeon: Ivin Poot, MD;  Location: [facility];  Service: Open Heart Surgery;  Laterality: N/A;   CYSTOSCOPY/URETEROSCOPY/HOLMIUM LASER/STENT PLACEMENT Left 08/10/2018   Procedure: CYSTOSCOPY/URETEROSCOPY/HOLMIUM LASER/STENT PLACEMENT;  Surgeon: Festus Aloe, MD;  Location: [facility];  Service: Urology;  Laterality: Left;   CYSTOSCOPY/URETEROSCOPY/HOLMIUM LASER/STENT PLACEMENT Left 09/10/2018   Procedure: CYSTOSCOPY/URETEROSCOPY/HOLMIUM LASER/STENT EXCHANGE;  Surgeon: Festus Aloe, MD;  Location: [facility];  Service: Urology;  Laterality: Left;   LEFT HEART CATHETERIZATION WITH CORONARY ANGIOGRAM N/A 04/13/2014   Procedure: LEFT HEART CATHETERIZATION WITH CORONARY ANGIOGRAM;  Surgeon: Jacolyn Reedy, MD;  Location[facility];   Service: Cardiovascular;  Laterality: N/A;   LEG SURGERY Right age 32   closed reduction leg fracture   POLYPECTOMY     TEE WITHOUT CARDIOVERSION N/A 10/02/2015   Procedure: TRANSESOPHAGEAL ECHOCARDIOGRAM (TEE);  Surgeon: Ivin Poot, MD;  Location: [facility];  Service: Open Heart Surgery;  Laterality: N/A;   URETEROSCOPY WITH HOLMIUM LASER LITHOTRIPSY Bilateral 2004;  2005  dr Risa Grill  @[facility]    VASECTOMY      FAMILY HISTORY Family History  Problem Relation Age of Onset   Heart attack Mother    Heart attack Father    Diabetes Brother    Pancreatic cancer Brother    Diabetes Brother    Colon cancer Neg Hx    Esophageal cancer Neg Hx    Prostate cancer Neg Hx    Rectal cancer Neg Hx    Stomach  cancer Neg Hx    Colon polyps Neg Hx     SOCIAL HISTORY Social History   Tobacco Use   Smoking status: Never   Smokeless tobacco: Never  Vaping Use   Vaping Use: Never used  Substance Use Topics   Alcohol use: Not Currently   Drug use: Never         OPHTHALMIC EXAM:  Base Eye Exam     Visual Acuity (ETDRS)       Right Left   Dist cc 20/30 20/25   Dist ph cc NI NI         Tonometry (Tonopen, 8:16 AM)       Right Left   Pressure 13 15         Pupils       React APD   Right Brisk None   Left Brisk None         Visual Fields       Left Right    Full Full         Extraocular Movement       Right Left    Full, Ortho Full, Ortho         Neuro/Psych     Oriented x3: Yes   Mood/Affect: Normal         Dilation     Both eyes: 1.0% Mydriacyl, 2.5% Phenylephrine @ 8:16 AM           Slit Lamp and Fundus Exam     External Exam       Right Left   External Normal Normal         Slit Lamp Exam       Right Left   Lids/Lashes Normal Normal   Conjunctiva/Sclera White and quiet White and quiet   Cornea Clear Clear   Anterior Chamber Deep and quiet Deep and quiet   Iris Round and reactive Round and reactive   Lens Posterior chamber  intraocular lens Posterior chamber intraocular lens   Anterior Vitreous Normal Normal         Fundus Exam       Right Left   Posterior Vitreous Posterior vitreous detachment Normal   Disc Normal Normal   C/D Ratio 0.5 0.55   Macula Macular thickening superotemporal to fovea., Microaneurysms, Exudates no clinically significant macular edema, no macular thickening, Microaneurysms   Vessels NPDR- Moderate NPDR- Moderate   Periphery Choroidal nevus, small in macula,, stable, 2 dd size, flat Dot-blot hemorrhage            IMAGING AND PROCEDURES  Imaging and Procedures for 03/06/21  OCT, Retina - OU - Both Eyes       Right Eye Quality was good. Scan locations included subfoveal. Central Foveal Thickness: 276. Progression has worsened. Findings include abnormal foveal contour, cystoid macular edema.   Left Eye Quality was good. Scan locations included subfoveal. Central Foveal Thickness: 233. Progression has been stable.   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  Post focal treatment.  Repeat injection again OD today and examination next in 8 weeks       Intravitreal Injection, Pharmacologic Agent - OD - Right Eye       Time Out 03/06/2021. 8:36 AM. Confirmed correct patient, procedure, site, and patient consented.   Anesthesia Topical anesthesia was used. Anesthetic medications included Akten 3.5%.   Procedure Preparation included 5% betadine to ocular surface, 10% betadine to eyelids, Tobramycin 0.3%. A 30 gauge  needle was used.   Injection: 2.5 mg bevacizumab 2.5 MG/0.1ML   Route: Intravitreal   NDC: 02409-735-32, Lot: [id number]   Post-op Post injection exam found visual acuity of at least counting fingers. The patient tolerated the procedure well. There were no complications. The patient received written and verbal post procedure care education. Post injection medications were not given.               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 OSA on CPAP  G47.33    Z99.89     4. Choroidal nevus of right eye  D31.31       1.  14-week interval, CSME superotemporal right eye has recurred.  This area is responsive to treatment with Avastin yet recurs despite previous focal laser treatment developed and delivered 07-2020 and March 2022.  She will repeat injection Avastin today reevaluate in 8 weeks  2.  OS remained stable with moderate NPDR OU.  Will observe OS  3.  Ophthalmic Meds Ordered this visit:  Meds ordered this encounter  Medications   bevacizumab (AVASTIN) SOSY 2.5 mg       Return in about 8 weeks (around 05/01/2021) for DILATE OU, AVASTIN OCT, OD.  There are no Patient Instructions on file for this visit.   Explained the diagnoses, plan, and follow up with the patient and they expressed understanding.  Patient expressed understanding of the importance of proper follow up care.   Clent Demark Teara Duerksen M.D. Diseases & Surgery of the Retina and Vitreous [facility] [place] 03/06/21     Abbreviations: M myopia (nearsighted); A astigmatism; H hyperopia (farsighted); P presbyopia; Mrx spectacle prescription;  CTL contact lenses; OD right eye; OS left eye; OU both eyes  XT exotropia; ET esotropia; PEK punctate epithelial keratitis; PEE punctate epithelial erosions; DES dry eye syndrome; MGD meibomian gland dysfunction; ATs artificial tears; PFAT's preservative free artificial tears; Leonard nuclear sclerotic cataract; PSC posterior subcapsular cataract; ERM epi-retinal membrane; PVD posterior vitreous detachment; RD retinal detachment; DM diabetes mellitus; DR diabetic retinopathy; NPDR non-proliferative diabetic retinopathy; PDR proliferative diabetic retinopathy; CSME clinically significant macular edema; DME diabetic macular edema; dbh dot blot hemorrhages; CWS cotton wool spot; POAG primary open angle glaucoma; C/D cup-to-disc ratio; HVF humphrey visual field; GVF goldmann visual field; OCT optical coherence tomography; IOP intraocular pressure; BRVO Branch retinal vein occlusion; CRVO central retinal vein occlusion; CRAO central retinal artery occlusion; BRAO branch retinal artery occlusion; RT retinal tear; SB scleral buckle; PPV pars plana vitrectomy; VH Vitreous hemorrhage; PRP panretinal laser photocoagulation; IVK intravitreal kenalog; VMT vitreomacular traction; MH Macular hole;  NVD neovascularization of the disc; NVE neovascularization elsewhere; AREDS age related eye disease study; ARMD age related macular degeneration; POAG primary open angle glaucoma; EBMD epithelial/anterior basement membrane dystrophy; ACIOL anterior chamber intraocular lens; IOL intraocular lens; PCIOL posterior chamber intraocular lens; Phaco/IOL phacoemulsification with intraocular lens placement; [place] photorefractive keratectomy; LASIK laser assisted in situ keratomileusis; HTN hypertension; DM diabetes mellitus; COPD chronic obstructive pulmonary disease

## 2021-03-06 NOTE — Assessment & Plan Note (Signed)
Stable no interval change

## 2021-03-06 NOTE — Assessment & Plan Note (Signed)
Recurrent CSME OD superotemporal to the fovea.  Repeat injection intravitreal Avastin OD today and follow-up next in 8 weeks

## 2021-03-11 ENCOUNTER — Other Ambulatory Visit: Payer: Self-pay | Admitting: Nephrology

## 2021-03-11 DIAGNOSIS — N184 Chronic kidney disease, stage 4 (severe): Secondary | ICD-10-CM

## 2021-03-11 DIAGNOSIS — N179 Acute kidney failure, unspecified: Secondary | ICD-10-CM

## 2021-03-27 ENCOUNTER — Other Ambulatory Visit: Payer: Self-pay | Admitting: Physician Assistant

## 2021-03-27 NOTE — Telephone Encounter (Signed)
Prescription refill request for Eliquis received. Indication: PAF Last office visit: 12/04/20  Liane Comber MD Scr: 1.96 on 11/30/20 Age:  75 Weight: 88.1kg  Based on above findings Eliquis 5mg  twice daily is the appropriate dose.  Refill approved.

## 2021-04-09 ENCOUNTER — Other Ambulatory Visit: Payer: Self-pay | Admitting: Family Medicine

## 2021-04-09 ENCOUNTER — Other Ambulatory Visit: Payer: Self-pay

## 2021-04-09 ENCOUNTER — Ambulatory Visit
Admission: RE | Admit: 2021-04-09 | Discharge: 2021-04-09 | Disposition: A | Discharge status: Home / Self Care | Payer: Medicare Other | Source: Ambulatory Visit | Attending: Family Medicine | Admitting: Family Medicine

## 2021-04-09 DIAGNOSIS — R0602 Shortness of breath: Secondary | ICD-10-CM

## 2021-04-18 NOTE — Progress Notes (Signed)
Pt stopped by [facility] office requesting a BP check. Sts his machine at home was giving him very high numbers.  He takes BP meds. Uses a wrist machine Checked both arms Right 120/68, Left 128/70 Does have an irregular pulse Does exercise (swim) Hx of CABG and subsequent SOB Admitted to having difficulty wearing his CPAP-encouraged use Sts does check his blood sugar at home --am bs this am 144 before breakfast Commented his wife was worried about the BP causing him to pass out. Advised that could be serious Does have an appt scheduled this month-encouraged to move that up since passing out can be very serious Suggested he try an arm cuff instead of the wrist bp cuff.

## 2021-05-01 ENCOUNTER — Ambulatory Visit (INDEPENDENT_AMBULATORY_CARE_PROVIDER_SITE_OTHER): Payer: Medicare Other | Admitting: Ophthalmology

## 2021-05-01 ENCOUNTER — Encounter (INDEPENDENT_AMBULATORY_CARE_PROVIDER_SITE_OTHER): Payer: Self-pay | Admitting: Ophthalmology

## 2021-05-01 ENCOUNTER — Other Ambulatory Visit: Payer: Self-pay

## 2021-05-01 DIAGNOSIS — E113311 Type 2 diabetes mellitus with moderate nonproliferative diabetic retinopathy with macular edema, right eye: Secondary | ICD-10-CM

## 2021-05-01 DIAGNOSIS — G4733 Obstructive sleep apnea (adult) (pediatric): Secondary | ICD-10-CM

## 2021-05-01 DIAGNOSIS — Z9989 Dependence on other enabling machines and devices: Secondary | ICD-10-CM

## 2021-05-01 MED ORDER — BEVACIZUMAB 2.5 MG/0.1ML IZ SOSY
2.5000 mg | PREFILLED_SYRINGE | INTRAVITREAL | Status: AC | PRN
  Administered 2021-05-01: 2.5 mg via INTRAVITREAL

## 2021-05-01 NOTE — Assessment & Plan Note (Addendum)
OD now some 6 months post focal laser treatment in 2 months after most recent intravitreal Avastin less CME /CSME on OCT  Repeat intravitreal Avastin today, follow-up with exam and fluorescein angiography next

## 2021-05-01 NOTE — Assessment & Plan Note (Signed)
Continues with excellent compliance with CPAP preventing nightly hypoxic stress to the macula

## 2021-05-01 NOTE — Progress Notes (Signed)
05/01/2021     CHIEF COMPLAINT Patient presents for Retina Follow Up   HISTORY OF PRESENT ILLNESS: Fernando Hess is a 75 y.o. male who presents to the clinic today for:   HPI     Retina Follow Up           Diagnosis: Diabetic Retinopathy   Laterality: right eye   Onset: 8 weeks ago   Severity: mild   Duration: 8 weeks   Course: stable         Comments   8 week fu OU and Avastin OD Pt states VA OU stable since last visit. Pt denies FOL, floaters, or ocular pain OU.  A1C:9.9 LBS:171       Last edited by Kendra Opitz, COA on 05/01/2021  8:01 AM.      Referring physician: [name], MD [place],  [place] [id number]  HISTORICAL INFORMATION:   Selected notes from the MEDICAL RECORD NUMBER    Lab Results  Component Value Date   HGBA1C 11.1 (H) 06/11/2020     CURRENT MEDICATIONS: No current outpatient medications on file. (Ophthalmic Drugs)   No current facility-administered medications for this visit. (Ophthalmic Drugs)   Current Outpatient Medications (Other)  Medication Sig   atorvastatin (LIPITOR) 40 MG tablet Take 40 mg by mouth daily.    B-D UF III MINI PEN NEEDLES 31G X 5 MM MISC USE AS DIRECTED WITH LANTUS SOLOSTAR   busPIRone (BUSPAR) 10 MG tablet Take 1 tablet (10 mg total) by mouth at bedtime.   DULoxetine (CYMBALTA) 30 MG capsule Take 30 mg by mouth at bedtime.   DULoxetine (CYMBALTA) 60 MG capsule Take 60 mg by mouth daily.    ELIQUIS 5 MG TABS tablet TAKE 1 TABLET BY MOUTH TWICE A DAY   esomeprazole (NEXIUM) 20 MG capsule Take 20 mg by mouth as needed.   furosemide (LASIX) 20 MG tablet Take 20 mg by mouth as needed for shortness of breath or edema.       Insulin Glargine (BASAGLAR KWIKPEN) 100 UNIT/ML SOPN Inject 31 Units into the skin daily.    MAGNESIUM-ZINC PO Take 1 tablet by mouth daily.   Melatonin 3 MG TABS Take 1 tablet by mouth daily as needed (for sleep).    metoprolol succinate (TOPROL-XL) 25 MG 24 hr  tablet TAKE 1/2 TABLET BY MOUTH EVERY DAY   Multiple Vitamin (MULTIVITAMIN) tablet Take 1 tablet by mouth daily.   nitroGLYCERIN (NITROSTAT) 0.4 MG SL tablet Place 0.4 mg under the tongue every 5 (five) minutes as needed for chest pain.   nystatin ointment (MYCOSTATIN) Apply 1 application topically as needed.    ondansetron (ZOFRAN-ODT) 4 MG disintegrating tablet Take 4 mg by mouth every 8 (eight) hours as needed for nausea or vomiting.   ONETOUCH ULTRA test strip FOR USE OF CHEACKING BLOOD ONCE DAILY   pantoprazole (PROTONIX) 40 MG tablet TAKE 1 TABLET BY MOUTH EVERY DAY   pioglitazone (ACTOS) 45 MG tablet Take 45 mg by mouth daily.   No current facility-administered medications for this visit. (Other)      REVIEW OF SYSTEMS:    ALLERGIES Allergies  Allergen Reactions   Levaquin [Levofloxacin] Itching and Rash   Lisinopril Itching and Rash   Victoza [Liraglutide]     Severe fatigue & insomnia    PAST MEDICAL HISTORY Past Medical History:  Diagnosis Date   Acute renal failure (ARF) ([place]) 09/24/2015   Atrial premature complexes  CAD (coronary artery disease) of artery bypass graft    Early occlusion of saphenous vein graft to intermediate and marginal branch in February 2007 following bypass grafting    CAD (coronary artery disease), native coronary artery cardiologist-- dr Liane Comber   hx NSTEMI 09-24-2015  s/p  CABG x5 on 10-01-2017;  post op STEMI inferolateral wall,  SVG OM1 and SVG OM2 occluded, distal OM occlusion the calpruit, treated medically // Myoview 7/21: no ischemia, EF 65, low risk    CKD (chronic kidney disease), stage III ([name])    Elevated troponin    Erectile dysfunction    Esophageal reflux    History of atrial fibrillation    post op CABG 10-02-2015   History of kidney stones    History of kidney stones    History of non-ST elevation myocardial infarction (NSTEMI) 09/24/2015   s/p  CABG x5   History of ST elevation myocardial infarction (STEMI)  10/22/2015   inferior wall,  post op CABG 10-02-2015   Hyperlipidemia    Hypertension    Kidney stone    h/o   Left ureteral stone    Mild atherosclerosis of both carotid arteries    Nephrolithiasis    per CT bilateral non-obstructive calculi   OSA (obstructive sleep apnea)    Peripheral artery disease    LE Arterial US 01/2019: R PTA and ATA occluded; L ATA occluded   RBBB (right bundle branch block)    Renal atrophy, right    Sleep apnea    wears cpap    ST elevation myocardial infarction (STEMI) of inferior wall ([place]) 10/22/2015   Type 2 diabetes mellitus treated with insulin ([place])    followed by pcp   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 ([place]) 03/01/2008   Wears glasses    Past Surgical History:  Procedure Laterality Date   [place] N/A 09/26/2015   Procedure: Left Heart Cath and Coronary Angiography;  Surgeon: Troy Sine, MD;  Location: [facility];  Service: Cardiovascular;  Laterality: N/A;   CARDIAC CATHETERIZATION N/A 10/22/2015   Procedure: Left Heart Cath and Coronary Angiography;  Surgeon: Sherren Mocha, MD;  Location: [facility];  Service: Cardiovascular;  Laterality: N/A;   CATARACT EXTRACTION W/ INTRAOCULAR LENS  IMPLANT, BILATERAL  2017   COLONOSCOPY     CORONARY ARTERY BYPASS GRAFT N/A 10/02/2015   Procedure: CORONARY ARTERY BYPASS GRAFTING (CABG) X5 LIMA-LAD; SVG-DIAG; SVG-OM; SVG-PD; SVG-RAMUS TRANSESOPHAGEAL ECHOCARDIOGRAM (TEE) ENDOSCOPIC GREATER SAPHENOUS VEIN  HARVEST BILAT LE;  Surgeon: Ivin Poot, MD;  Location: [facility];  Service: Open Heart Surgery;  Laterality: N/A;   CYSTOSCOPY/URETEROSCOPY/HOLMIUM LASER/STENT PLACEMENT Left 08/10/2018   Procedure: CYSTOSCOPY/URETEROSCOPY/HOLMIUM LASER/STENT PLACEMENT;  Surgeon: Festus Aloe, MD;  Location: [facility];  Service: Urology;  Laterality: Left;    CYSTOSCOPY/URETEROSCOPY/HOLMIUM LASER/STENT PLACEMENT Left 09/10/2018   Procedure: CYSTOSCOPY/URETEROSCOPY/HOLMIUM LASER/STENT EXCHANGE;  Surgeon: Festus Aloe, MD;  Location: [facility];  Service: Urology;  Laterality: Left;   LEFT HEART CATHETERIZATION WITH CORONARY ANGIOGRAM N/A 04/13/2014   Procedure: LEFT HEART CATHETERIZATION WITH CORONARY ANGIOGRAM;  Surgeon: Jacolyn Reedy, MD;  Location[facility];  Service: Cardiovascular;  Laterality: N/A;   LEG SURGERY Right age 53   closed reduction leg fracture   POLYPECTOMY     TEE WITHOUT CARDIOVERSION N/A 10/02/2015   Procedure: TRANSESOPHAGEAL ECHOCARDIOGRAM (TEE);  Surgeon: Ivin Poot, MD;  Location: [facility];  Service: Open Heart Surgery;  Laterality: N/A;   URETEROSCOPY WITH HOLMIUM LASER LITHOTRIPSY Bilateral 2004;  2005  dr grapey  @[facility]    VASECTOMY      FAMILY HISTORY Family History  Problem Relation Age of Onset   Heart attack Mother    Heart attack Father    Diabetes Brother    Pancreatic cancer Brother    Diabetes Brother    Colon cancer Neg Hx    Esophageal cancer Neg Hx    Prostate cancer Neg Hx    Rectal cancer Neg Hx    Stomach cancer Neg Hx    Colon polyps Neg Hx     SOCIAL HISTORY Social History   Tobacco Use   Smoking status: Never   Smokeless tobacco: Never  Vaping Use   Vaping Use: Never used  Substance Use Topics   Alcohol use: Not Currently   Drug use: Never         OPHTHALMIC EXAM:  Base Eye Exam     Visual Acuity (ETDRS)       Right Left   Dist cc 20/50 -1 20/25 -2   Dist ph cc 20/30          Tonometry (Tonopen, 8:04 AM)       Right Left   Pressure 14 16         Pupils       Pupils Dark Light Shape React APD   Right PERRL 5 5 Round Minimal None   Left PERRL 5 5 Round Minimal None         Visual Fields (Counting fingers)       Left Right    Full Full         Extraocular Movement       Right Left    Full Full         Neuro/Psych     Oriented x3: Yes    Mood/Affect: Normal         Dilation     Both eyes: 1.0% Mydriacyl, 2.5% Phenylephrine @ 8:04 AM           Slit Lamp and Fundus Exam     External Exam       Right Left   External Normal Normal         Slit Lamp Exam       Right Left   Lids/Lashes Normal Normal   Conjunctiva/Sclera White and quiet White and quiet   Cornea Clear Clear   Anterior Chamber Deep and quiet Deep and quiet   Iris Round and reactive Round and reactive   Lens Posterior chamber intraocular lens Posterior chamber intraocular lens   Anterior Vitreous Normal Normal         Fundus Exam       Right Left   Posterior Vitreous Posterior vitreous detachment Normal   Disc Normal Normal   C/D Ratio 0.5 0.55   Macula Macular thickening superotemporal to fovea., Microaneurysms, Exudates no clinically significant macular edema, no macular thickening, Microaneurysms   Vessels NPDR- Moderate NPDR- Moderate   Periphery Choroidal nevus, 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 Scan locations included subfoveal. Central Foveal Thickness: 267. Progression has worsened. Findings include abnormal foveal contour, cystoid macular edema.  Left Eye Quality was good. Scan locations included subfoveal. Central Foveal Thickness: 252. Progression has been stable. Findings include abnormal foveal contour.   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  5.5 months post focal treatment.  Repeat injection again OD today and examination next in 8 weeks and study residual CSME and retinopathy with fluorescein angiography next       Intravitreal Injection, Pharmacologic Agent - OD - Right Eye       Time Out 05/01/2021. 8:25 AM. Confirmed correct patient, procedure, site, and patient consented.    Anesthesia Topical anesthesia was used. Anesthetic medications included Akten 3.5%.   Procedure Preparation included 5% betadine to ocular surface, 10% betadine to eyelids, Tobramycin 0.3%. A 30 gauge needle was used.   Injection: 2.5 mg bevacizumab 2.5 MG/0.1ML   Route: Intravitreal, Site: Right Eye   NDC: [phone], Lot: [id number]   Post-op Post injection exam found visual acuity of at least counting fingers. The patient tolerated the procedure well. There were no complications. The patient received written and verbal post procedure care education. Post injection medications were not given.              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 OSA on CPAP  G47.33    Z99.89       1.  OD, residual CSME not center involved superiorly.  Improved at 8-week follow-up post Avastin.  We will repeat injection today  2.  Dilate OU next  3.  FFA OU next, transit OD possible injection Avastin OD  Ophthalmic Meds Ordered this visit:  Meds ordered this encounter  Medications   bevacizumab (AVASTIN) SOSY 2.5 mg       Return in about 8 weeks (around 06/26/2021) for DILATE OU, COLOR FP, OPTOS FFA R/L, AVASTIN OCT, OD.  There are no Patient Instructions on file for this visit.   Explained the diagnoses, plan,  and follow up with the patient and they expressed understanding.  Patient expressed understanding of the importance of proper follow up care.   Clent Demark Carlon Chaloux M.D. Diseases & Surgery of the Retina and Vitreous Retina & Diabetic [place] 05/01/21     Abbreviations: M myopia (nearsighted); A astigmatism; H hyperopia (farsighted); P presbyopia; Mrx spectacle prescription;  CTL contact lenses; OD right eye; OS left eye; OU both eyes  XT exotropia; ET esotropia; PEK punctate epithelial keratitis; PEE punctate epithelial erosions; DES dry eye syndrome; MGD meibomian gland dysfunction; ATs artificial tears; PFAT's preservative free artificial tears; [place] nuclear sclerotic cataract; PSC posterior subcapsular cataract; ERM epi-retinal membrane; PVD posterior vitreous detachment; RD retinal detachment; DM diabetes mellitus; DR diabetic retinopathy; NPDR non-proliferative diabetic retinopathy; PDR proliferative diabetic retinopathy; CSME clinically significant macular edema; DME diabetic macular edema; dbh dot blot hemorrhages; CWS cotton wool spot; POAG primary  open angle glaucoma; C/D cup-to-disc ratio; HVF humphrey visual field; GVF goldmann visual field; OCT optical coherence tomography; IOP intraocular pressure; BRVO Branch retinal vein occlusion; CRVO central retinal vein occlusion; CRAO central retinal artery occlusion; BRAO branch retinal artery occlusion; RT retinal tear; SB scleral buckle; PPV pars plana vitrectomy; VH Vitreous hemorrhage; PRP panretinal laser photocoagulation; IVK intravitreal kenalog; VMT vitreomacular traction; MH Macular hole;  NVD neovascularization of the disc; NVE neovascularization elsewhere; AREDS age related eye disease study; ARMD age related macular degeneration; POAG primary open angle glaucoma; EBMD epithelial/anterior basement membrane dystrophy; ACIOL anterior chamber intraocular lens; IOL intraocular lens; PCIOL posterior chamber intraocular lens; Phaco/IOL  phacoemulsification with intraocular lens placement; [place] photorefractive keratectomy; LASIK laser assisted in situ keratomileusis; HTN hypertension; DM diabetes mellitus; COPD chronic obstructive pulmonary disease

## 2021-05-10 ENCOUNTER — Emergency Department (HOSPITAL_COMMUNITY): Payer: Medicare Other

## 2021-05-10 ENCOUNTER — Inpatient Hospital Stay (HOSPITAL_COMMUNITY)
Admission: EM | Admit: 2021-05-10 | Discharge: 2021-05-12 | DRG: 291 | Disposition: A | Discharge status: Home Health | Payer: Medicare Other | Attending: Internal Medicine | Admitting: Internal Medicine

## 2021-05-10 ENCOUNTER — Observation Stay (HOSPITAL_COMMUNITY): Payer: Medicare Other

## 2021-05-10 ENCOUNTER — Other Ambulatory Visit: Payer: Self-pay

## 2021-05-10 ENCOUNTER — Emergency Department (HOSPITAL_BASED_OUTPATIENT_CLINIC_OR_DEPARTMENT_OTHER): Payer: Medicare Other

## 2021-05-10 ENCOUNTER — Encounter (HOSPITAL_COMMUNITY): Payer: Self-pay | Admitting: Physician Assistant

## 2021-05-10 DIAGNOSIS — E669 Obesity, unspecified: Secondary | ICD-10-CM | POA: Diagnosis present

## 2021-05-10 DIAGNOSIS — Z79899 Other long term (current) drug therapy: Secondary | ICD-10-CM

## 2021-05-10 DIAGNOSIS — E1322 Other specified diabetes mellitus with diabetic chronic kidney disease: Secondary | ICD-10-CM

## 2021-05-10 DIAGNOSIS — N1832 Chronic kidney disease, stage 3b: Secondary | ICD-10-CM | POA: Diagnosis present

## 2021-05-10 DIAGNOSIS — I350 Nonrheumatic aortic (valve) stenosis: Secondary | ICD-10-CM | POA: Diagnosis present

## 2021-05-10 DIAGNOSIS — I252 Old myocardial infarction: Secondary | ICD-10-CM

## 2021-05-10 DIAGNOSIS — I5021 Acute systolic (congestive) heart failure: Secondary | ICD-10-CM

## 2021-05-10 DIAGNOSIS — R0600 Dyspnea, unspecified: Secondary | ICD-10-CM

## 2021-05-10 DIAGNOSIS — I130 Hypertensive heart and chronic kidney disease with heart failure and stage 1 through stage 4 chronic kidney disease, or unspecified chronic kidney disease: Secondary | ICD-10-CM | POA: Diagnosis not present

## 2021-05-10 DIAGNOSIS — Y92009 Unspecified place in unspecified non-institutional (private) residence as the place of occurrence of the external cause: Secondary | ICD-10-CM

## 2021-05-10 DIAGNOSIS — Z888 Allergy status to other drugs, medicaments and biological substances status: Secondary | ICD-10-CM

## 2021-05-10 DIAGNOSIS — E1165 Type 2 diabetes mellitus with hyperglycemia: Secondary | ICD-10-CM | POA: Diagnosis present

## 2021-05-10 DIAGNOSIS — R001 Bradycardia, unspecified: Secondary | ICD-10-CM | POA: Diagnosis present

## 2021-05-10 DIAGNOSIS — Z800 Family history of malignant neoplasm of digestive organs: Secondary | ICD-10-CM

## 2021-05-10 DIAGNOSIS — Z951 Presence of aortocoronary bypass graft: Secondary | ICD-10-CM

## 2021-05-10 DIAGNOSIS — Z87442 Personal history of urinary calculi: Secondary | ICD-10-CM

## 2021-05-10 DIAGNOSIS — T502X5A Adverse effect of carbonic-anhydrase inhibitors, benzothiadiazides and other diuretics, initial encounter: Secondary | ICD-10-CM | POA: Diagnosis present

## 2021-05-10 DIAGNOSIS — I5033 Acute on chronic diastolic (congestive) heart failure: Secondary | ICD-10-CM | POA: Diagnosis present

## 2021-05-10 DIAGNOSIS — R55 Syncope and collapse: Secondary | ICD-10-CM

## 2021-05-10 DIAGNOSIS — I480 Paroxysmal atrial fibrillation: Secondary | ICD-10-CM | POA: Diagnosis present

## 2021-05-10 DIAGNOSIS — I25118 Atherosclerotic heart disease of native coronary artery with other forms of angina pectoris: Secondary | ICD-10-CM

## 2021-05-10 DIAGNOSIS — Z79890 Hormone replacement therapy (postmenopausal): Secondary | ICD-10-CM

## 2021-05-10 DIAGNOSIS — I5031 Acute diastolic (congestive) heart failure: Secondary | ICD-10-CM

## 2021-05-10 DIAGNOSIS — E1122 Type 2 diabetes mellitus with diabetic chronic kidney disease: Secondary | ICD-10-CM

## 2021-05-10 DIAGNOSIS — Z833 Family history of diabetes mellitus: Secondary | ICD-10-CM

## 2021-05-10 DIAGNOSIS — I951 Orthostatic hypotension: Secondary | ICD-10-CM | POA: Diagnosis present

## 2021-05-10 DIAGNOSIS — K219 Gastro-esophageal reflux disease without esophagitis: Secondary | ICD-10-CM | POA: Diagnosis present

## 2021-05-10 DIAGNOSIS — E785 Hyperlipidemia, unspecified: Secondary | ICD-10-CM | POA: Diagnosis present

## 2021-05-10 DIAGNOSIS — I4510 Unspecified right bundle-branch block: Secondary | ICD-10-CM | POA: Diagnosis present

## 2021-05-10 DIAGNOSIS — E1151 Type 2 diabetes mellitus with diabetic peripheral angiopathy without gangrene: Secondary | ICD-10-CM | POA: Diagnosis present

## 2021-05-10 DIAGNOSIS — Z6831 Body mass index (BMI) 31.0-31.9, adult: Secondary | ICD-10-CM

## 2021-05-10 DIAGNOSIS — Z8249 Family history of ischemic heart disease and other diseases of the circulatory system: Secondary | ICD-10-CM

## 2021-05-10 DIAGNOSIS — Z7901 Long term (current) use of anticoagulants: Secondary | ICD-10-CM

## 2021-05-10 DIAGNOSIS — I129 Hypertensive chronic kidney disease with stage 1 through stage 4 chronic kidney disease, or unspecified chronic kidney disease: Secondary | ICD-10-CM

## 2021-05-10 DIAGNOSIS — T465X5A Adverse effect of other antihypertensive drugs, initial encounter: Secondary | ICD-10-CM | POA: Diagnosis present

## 2021-05-10 DIAGNOSIS — I2510 Atherosclerotic heart disease of native coronary artery without angina pectoris: Secondary | ICD-10-CM | POA: Diagnosis present

## 2021-05-10 DIAGNOSIS — Z794 Long term (current) use of insulin: Secondary | ICD-10-CM

## 2021-05-10 DIAGNOSIS — G4733 Obstructive sleep apnea (adult) (pediatric): Secondary | ICD-10-CM | POA: Diagnosis present

## 2021-05-10 DIAGNOSIS — Z7984 Long term (current) use of oral hypoglycemic drugs: Secondary | ICD-10-CM

## 2021-05-10 DIAGNOSIS — Z20822 Contact with and (suspected) exposure to covid-19: Secondary | ICD-10-CM | POA: Diagnosis present

## 2021-05-10 DIAGNOSIS — G4730 Sleep apnea, unspecified: Secondary | ICD-10-CM | POA: Diagnosis present

## 2021-05-10 LAB — COMPREHENSIVE METABOLIC PANEL
ALT: 21 U/L (ref 0–44)
AST: 28 U/L (ref 15–41)
Albumin: 3.2 g/dL — ABNORMAL LOW (ref 3.5–5.0)
Alkaline Phosphatase: 110 U/L (ref 38–126)
Anion gap: 7 (ref 5–15)
BUN: 29 mg/dL — ABNORMAL HIGH (ref 8–23)
CO2: 24 mmol/L (ref 22–32)
Calcium: 8.4 mg/dL — ABNORMAL LOW (ref 8.9–10.3)
Chloride: 104 mmol/L (ref 98–111)
Creatinine, Ser: 2.04 mg/dL — ABNORMAL HIGH (ref 0.61–1.24)
GFR, Estimated: 33 mL/min — ABNORMAL LOW (ref >60)
Glucose, Bld: 360 mg/dL — ABNORMAL HIGH (ref 70–99)
Potassium: 4.4 mmol/L (ref 3.5–5.1)
Sodium: 135 mmol/L (ref 135–145)
Total Bilirubin: 1.1 mg/dL (ref 0.3–1.2)
Total Protein: 5.8 g/dL — ABNORMAL LOW (ref 6.5–8.1)

## 2021-05-10 LAB — I-STAT VENOUS BLOOD GAS, ED
Acid-base deficit: 1 mmol/L (ref 0.0–2.0)
Bicarbonate: 26.4 mmol/L (ref 20.0–28.0)
Calcium, Ion: 1.15 mmol/L (ref 1.15–1.40)
HCT: 43 % (ref 39.0–52.0)
Hemoglobin: 14.6 g/dL (ref 13.0–17.0)
O2 Saturation: 59 %
Potassium: 4.2 mmol/L (ref 3.5–5.1)
Sodium: 137 mmol/L (ref 135–145)
TCO2: 28 mmol/L (ref 22–32)
pCO2, Ven: 52.6 mmHg (ref 44.0–60.0)
pH, Ven: 7.31 (ref 7.250–7.430)
pO2, Ven: 34 mmHg (ref 32.0–45.0)

## 2021-05-10 LAB — ECHOCARDIOGRAM COMPLETE
AR max vel: 1.8 cm2
AV Area VTI: 1.81 cm2
AV Area mean vel: 1.96 cm2
AV Mean grad: 11.7 mmHg
AV Peak grad: 25 mmHg
Ao pk vel: 2.5 m/s
Area-P 1/2: 3.72 cm2
S' Lateral: 2.7 cm

## 2021-05-10 LAB — RESP PANEL BY RT-PCR (FLU A&B, COVID) ARPGX2
Influenza A by PCR: NEGATIVE
Influenza B by PCR: NEGATIVE
SARS Coronavirus 2 by RT PCR: NEGATIVE

## 2021-05-10 LAB — BRAIN NATRIURETIC PEPTIDE: B Natriuretic Peptide: 213.1 pg/mL — ABNORMAL HIGH (ref 0.0–100.0)

## 2021-05-10 LAB — TROPONIN I (HIGH SENSITIVITY)
Troponin I (High Sensitivity): 9 ng/L (ref <18)
Troponin I (High Sensitivity): 9 ng/L (ref <18)

## 2021-05-10 LAB — CBG MONITORING, ED
Glucose-Capillary: 102 mg/dL — ABNORMAL HIGH (ref 70–99)
Glucose-Capillary: 299 mg/dL — ABNORMAL HIGH (ref 70–99)

## 2021-05-10 LAB — PROTIME-INR
INR: 1.2 (ref 0.8–1.2)
Prothrombin Time: 14.9 seconds (ref 11.4–15.2)

## 2021-05-10 LAB — MAGNESIUM: Magnesium: 1.9 mg/dL (ref 1.7–2.4)

## 2021-05-10 LAB — D-DIMER, QUANTITATIVE: D-Dimer, Quant: <0.27 ug/mL-FEU (ref 0.00–0.50)

## 2021-05-10 MED ORDER — ACETAMINOPHEN 325 MG PO TABS: 325.0000 mg | ORAL_TABLET | Freq: Four times a day (QID) | ORAL | Status: DC | PRN

## 2021-05-10 MED ORDER — SODIUM CHLORIDE 0.9% FLUSH
3.0000 mL | Freq: Two times a day (BID) | INTRAVENOUS | Status: DC
  Administered 2021-05-10 – 2021-05-12 (×4): 3 mL via INTRAVENOUS

## 2021-05-10 MED ORDER — PANTOPRAZOLE SODIUM 40 MG PO TBEC
40.0000 mg | DELAYED_RELEASE_TABLET | Freq: Every day | ORAL | Status: DC
  Administered 2021-05-11 – 2021-05-12 (×2): 40 mg via ORAL
  Filled 2021-05-10 (×2): qty 1

## 2021-05-10 MED ORDER — BASAGLAR KWIKPEN 100 UNIT/ML ~~LOC~~ SOPN: 14.0000 [IU] | PEN_INJECTOR | SUBCUTANEOUS | Status: DC

## 2021-05-10 MED ORDER — ONDANSETRON 4 MG PO TBDP: 4.0000 mg | ORAL_TABLET | Freq: Three times a day (TID) | ORAL | Status: DC | PRN

## 2021-05-10 MED ORDER — APIXABAN 5 MG PO TABS
5.0000 mg | ORAL_TABLET | Freq: Two times a day (BID) | ORAL | Status: DC
  Administered 2021-05-10 – 2021-05-12 (×4): 5 mg via ORAL
  Filled 2021-05-10 (×4): qty 1

## 2021-05-10 MED ORDER — HYDRALAZINE HCL 25 MG PO TABS: 25.0000 mg | ORAL_TABLET | Freq: Four times a day (QID) | ORAL | Status: DC | PRN

## 2021-05-10 MED ORDER — DULOXETINE HCL 30 MG PO CPEP
90.0000 mg | ORAL_CAPSULE | Freq: Every day | ORAL | Status: DC
  Administered 2021-05-11 – 2021-05-12 (×2): 90 mg via ORAL
  Filled 2021-05-10 (×2): qty 3

## 2021-05-10 MED ORDER — INSULIN ASPART 100 UNIT/ML IJ SOLN
0.0000 [IU] | Freq: Three times a day (TID) | INTRAMUSCULAR | Status: DC
  Administered 2021-05-10 – 2021-05-11 (×2): 5 [IU] via SUBCUTANEOUS
  Administered 2021-05-11: 1 [IU] via SUBCUTANEOUS
  Administered 2021-05-11: 2 [IU] via SUBCUTANEOUS
  Administered 2021-05-12: 1 [IU] via SUBCUTANEOUS
  Administered 2021-05-12: 2 [IU] via SUBCUTANEOUS

## 2021-05-10 MED ORDER — INSULIN LISPRO (1 UNIT DIAL) 100 UNIT/ML (KWIKPEN): 4.0000 [IU] | PEN_INJECTOR | Freq: Every day | SUBCUTANEOUS | Status: DC

## 2021-05-10 MED ORDER — DULOXETINE HCL 30 MG PO CPEP: 30.0000 mg | ORAL_CAPSULE | Freq: Every morning | ORAL | Status: DC

## 2021-05-10 MED ORDER — INSULIN GLARGINE-YFGN 100 UNIT/ML ~~LOC~~ SOLN
14.0000 [IU] | Freq: Every day | SUBCUTANEOUS | Status: DC
  Administered 2021-05-10 – 2021-05-11 (×2): 14 [IU] via SUBCUTANEOUS
  Filled 2021-05-10 (×3): qty 0.14

## 2021-05-10 MED ORDER — HYDROXYZINE HCL 25 MG PO TABS: 50.0000 mg | ORAL_TABLET | Freq: Three times a day (TID) | ORAL | Status: DC | PRN

## 2021-05-10 MED ORDER — INSULIN GLARGINE-YFGN 100 UNIT/ML ~~LOC~~ SOLN
34.0000 [IU] | Freq: Every day | SUBCUTANEOUS | Status: DC
  Administered 2021-05-11 – 2021-05-12 (×2): 34 [IU] via SUBCUTANEOUS
  Filled 2021-05-10 (×2): qty 0.34

## 2021-05-10 MED ORDER — PIOGLITAZONE HCL 30 MG PO TABS: 45.0000 mg | ORAL_TABLET | Freq: Every morning | ORAL | Status: DC

## 2021-05-10 MED ORDER — INSULIN ASPART 100 UNIT/ML IJ SOLN
4.0000 [IU] | Freq: Every day | INTRAMUSCULAR | Status: DC
  Administered 2021-05-11 – 2021-05-12 (×2): 4 [IU] via SUBCUTANEOUS

## 2021-05-10 MED ORDER — ACETAMINOPHEN-CODEINE #3 300-30 MG PO TABS: 1.0000 | ORAL_TABLET | Freq: Four times a day (QID) | ORAL | Status: DC | PRN

## 2021-05-10 MED ORDER — DULOXETINE HCL 60 MG PO CPEP: 60.0000 mg | ORAL_CAPSULE | Freq: Every morning | ORAL | Status: DC

## 2021-05-10 MED ORDER — PIOGLITAZONE HCL 30 MG PO TABS
45.0000 mg | ORAL_TABLET | Freq: Every morning | ORAL | Status: DC
  Administered 2021-05-11 – 2021-05-12 (×2): 45 mg via ORAL
  Filled 2021-05-10 (×2): qty 1

## 2021-05-10 MED ORDER — HYDROXYZINE PAMOATE 50 MG PO CAPS: 50.0000 mg | ORAL_CAPSULE | Freq: Three times a day (TID) | ORAL | Status: DC | PRN

## 2021-05-10 MED ORDER — MELATONIN 3 MG PO TABS: 3.0000 mg | ORAL_TABLET | Freq: Every day | ORAL | Status: DC | PRN

## 2021-05-10 MED ORDER — FUROSEMIDE 10 MG/ML IJ SOLN
40.0000 mg | Freq: Once | INTRAMUSCULAR | Status: AC
  Administered 2021-05-10: 40 mg via INTRAVENOUS
  Filled 2021-05-10: qty 4

## 2021-05-10 MED ORDER — ATORVASTATIN CALCIUM 40 MG PO TABS
40.0000 mg | ORAL_TABLET | Freq: Every evening | ORAL | Status: DC
  Administered 2021-05-10 – 2021-05-12 (×3): 40 mg via ORAL
  Filled 2021-05-10 (×2): qty 1

## 2021-05-10 NOTE — ED Provider Notes (Signed)
Patient care signed out to monitor while awaiting cardiology consultation.  Patient's had intermittent shortness of breath and heart palpitations.  Patient has significant cardiac history.  Repeat cardiology for official consult.  Golda Acre, MD 05/10/21 [phone]

## 2021-05-10 NOTE — H&P (Addendum)
History and Physical    Fernando Hess DOB: 1945/12/31 DOA: 05/10/2021  PCP: [name], MD (Confirm with patient/family/NH records and if not entered, this has to be entered at [facility] point of entry) Patient coming from: Home  I have personally briefly reviewed patient's old medical records in [place]  Chief Complaint: SOB  HPI: Fernando Hess is a 75 y.o. male with medical history significant of CAD s/p CABG, HTN, CKD stage IIIb, IDDM, PAF on Eliquis, came with increasing SOB and recurrent syncope.  Patient reported he had occasional dizziness in his past.  This Tuesday, while watching TV and sitting, he suddenly passed out without any warning signs such as light-headedness, SOB, palpitations, N/V. He woke up several minutes later, unsure about whether he hit his head during this episodes.  Since yesterday, he started to feel increasing shortness of breath with exertion, and it seems that minimal activity can trigger SOB. NO cough, no chest pain, no fever or chills.  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  He further denies any symptoms of diabetic neuropathy such as orthostasis or pin and needle sensations.  ED Course: BP elevated and one dose of IV Lasix given.  CT chest no significant Pulmonary congestion or pleural effusion. EKG showed frequent PVCs.  Review of Systems: As per HPI otherwise 14 point review of systems negative.    Past Medical History:  Diagnosis Date   Acute renal failure (ARF) ([place]) 09/24/2015   Atrial premature complexes    CAD (coronary artery disease) of artery bypass graft    Early occlusion of saphenous vein graft to intermediate and marginal branch in February 2007 following bypass grafting    CAD (coronary artery  disease), native coronary artery 2017   hx NSTEMI 09-24-2015  s/p  CABG x5 on 10-02-2015;  post op STEMI inferolateral wall,  SVG OM1 and SVG OM2 occluded, distal OM occlusion the calpruit, treated medically // Myoview 7/21: no ischemia, EF 65, low risk   CKD (chronic kidney disease), stage III ([name])    Elevated troponin    Erectile dysfunction    Esophageal reflux    History of atrial fibrillation    post op CABG 10-02-2015   History of non-ST elevation myocardial infarction (NSTEMI) 09/24/2015   s/p  CABG x5   History of ST elevation myocardial infarction (STEMI) 10/22/2015   inferior wall,  post op CABG 10-02-2015   Hyperlipidemia    Hypertension    Left ureteral stone    Mild atherosclerosis of both carotid arteries    Nephrolithiasis    per CT bilateral non-obstructive calculi   OSA (obstructive sleep apnea)    Peripheral artery disease    LE Arterial US 01/2019: R PTA and ATA occluded; L ATA occluded   RBBB (right bundle branch block)    Renal atrophy, right    Sleep apnea    wears cpap    ST elevation myocardial infarction (STEMI) of inferior wall ([place]) 10/22/2015   Type 2 diabetes mellitus treated with insulin ([place])    followed by pcp   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 ([place]) 03/01/2008   Wears glasses     Past Surgical History:  Procedure  Laterality Date   APPENDECTOMY  1965   CARDIAC CATHETERIZATION N/A 09/26/2015   Procedure: Left Heart Cath and Coronary Angiography;  Surgeon: Troy Sine, MD;  Location: [facility];  Service: Cardiovascular;  Laterality: N/A;   CARDIAC CATHETERIZATION N/A 10/22/2015   Procedure: Left Heart Cath and Coronary Angiography;  Surgeon: Sherren Mocha, MD;  Location: [place] [facility];  Service: Cardiovascular;  Laterality: N/A;   CATARACT EXTRACTION W/ INTRAOCULAR LENS  IMPLANT, BILATERAL  2017   COLONOSCOPY     CORONARY ARTERY BYPASS GRAFT N/A 10/02/2015    Procedure: CORONARY ARTERY BYPASS GRAFTING (CABG) X5 LIMA-LAD; SVG-DIAG; SVG-OM; SVG-PD; SVG-RAMUS TRANSESOPHAGEAL ECHOCARDIOGRAM (TEE) ENDOSCOPIC GREATER SAPHENOUS VEIN  HARVEST BILAT LE;  Surgeon: Ivin Poot, MD;  Location: [facility];  Service: Open Heart Surgery;  Laterality: N/A;   CYSTOSCOPY/URETEROSCOPY/HOLMIUM LASER/STENT PLACEMENT Left 08/10/2018   Procedure: CYSTOSCOPY/URETEROSCOPY/HOLMIUM LASER/STENT PLACEMENT;  Surgeon: Festus Aloe, MD;  Location: [facility];  Service: Urology;  Laterality: Left;   CYSTOSCOPY/URETEROSCOPY/HOLMIUM LASER/STENT PLACEMENT Left 09/10/2018   Procedure: CYSTOSCOPY/URETEROSCOPY/HOLMIUM LASER/STENT EXCHANGE;  Surgeon: Festus Aloe, MD;  Location: [facility];  Service: Urology;  Laterality: Left;   LEFT HEART CATHETERIZATION WITH CORONARY ANGIOGRAM N/A 04/13/2014   Procedure: LEFT HEART CATHETERIZATION WITH CORONARY ANGIOGRAM;  Surgeon: Jacolyn Reedy, MD;  Location: [facility];  Service: Cardiovascular;  Laterality: N/A;   LEG SURGERY Right age 55   closed reduction leg fracture   POLYPECTOMY     TEE WITHOUT CARDIOVERSION N/A 10/02/2015   Procedure: TRANSESOPHAGEAL ECHOCARDIOGRAM (TEE);  Surgeon: Ivin Poot, MD;  Location: [facility];  Service: Open Heart Surgery;  Laterality: N/A;   URETEROSCOPY WITH HOLMIUM LASER LITHOTRIPSY Bilateral 2004;  2005  dr grapey  @[facility]    VASECTOMY       reports that he has never smoked. He has never used smokeless tobacco. He reports that he does not currently use alcohol. He reports that he does not use drugs.  Allergies  Allergen Reactions   Cilostazol Swelling    edema   Trulicity [Dulaglutide] Nausea And Vomiting   Levaquin [Levofloxacin] Itching and Rash   Lisinopril Itching and Rash   Victoza [Liraglutide] Other (See Comments)    Severe fatigue & insomnia    Family History  Problem Relation Age of Onset   Heart attack Mother    Heart attack Father    Diabetes Brother    Pancreatic  cancer Brother    Diabetes Brother    Colon cancer Neg Hx    Esophageal cancer Neg Hx    Prostate cancer Neg Hx    Rectal cancer Neg Hx    Stomach cancer Neg Hx    Colon polyps Neg Hx      Prior to Admission medications   Medication Sig Start Date End Date Taking? Authorizing Provider  DULoxetine (CYMBALTA) 30 MG capsule Take 30 mg by mouth every morning. Take with a 60 mg capsule for a total morning dose of 90 mg   Yes [provider]  DULoxetine (CYMBALTA) 60 MG capsule Take 60 mg by mouth every morning. Take with a 30 mg capsule for a total morning dose of 90 mg 04/12/18  Yes [provider]  ELIQUIS 5 MG TABS tablet TAKE 1 TABLET BY MOUTH TWICE A DAY Patient taking differently: Take 5 mg by mouth 2 (two) times daily. 03/27/21  Yes Weaver, Scott T, [name]  metoprolol succinate (TOPROL-XL) 25 MG 24 hr tablet TAKE 1/2 TABLET BY MOUTH EVERY  DAY Patient taking differently: Take 12.5 mg by mouth every morning. 03/17/19  Yes Dorothy Spark, MD  ondansetron (ZOFRAN-ODT) 4 MG disintegrating tablet Take 4 mg by mouth every 8 (eight) hours as needed for nausea or vomiting.   Yes [provider]  acetaminophen-codeine (TYLENOL #3) 300-30 MG tablet Take 1 tablet by mouth every 6 (six) hours as needed. 01/28/21   [provider]  atorvastatin (LIPITOR) 40 MG tablet Take 40 mg by mouth daily.     [provider]  B-D UF III MINI PEN NEEDLES 31G X 5 MM MISC USE AS DIRECTED WITH LANTUS SOLOSTAR 04/02/19   [provider]  furosemide (LASIX) 20 MG tablet Take 20 mg by mouth as needed for shortness of breath or edema.        [provider]  hydrOXYzine (VISTARIL) 25 MG capsule Take 50 mg by mouth every 8 (eight) hours as needed. 01/04/21   [provider]  Insulin Glargine (BASAGLAR KWIKPEN) 100 UNIT/ML SOPN Inject 31 Units into the skin daily.     [provider]  MAGNESIUM-ZINC PO Take 1 tablet by mouth daily.    [provider]  Melatonin 3 MG TABS Take 1 tablet by mouth daily as needed (for sleep).     [provider]  nitroGLYCERIN (NITROSTAT) 0.4 MG SL tablet Place 0.4 mg under the tongue every 5 (five) minutes as needed for chest pain.    [provider]  nystatin ointment (MYCOSTATIN) Apply 1 application topically as needed.  07/02/20   [provider]  ONETOUCH ULTRA test strip FOR USE OF [place] BLOOD ONCE DAILY 03/18/19   [provider]  pantoprazole (PROTONIX) 40 MG tablet TAKE 1 TABLET BY MOUTH EVERY DAY 11/25/19   [name], MD  pioglitazone (ACTOS) 45 MG tablet Take 45 mg by mouth daily.    [provider]    Physical Exam: Vitals:   05/10/21 1130 05/10/21 1145 05/10/21 1315 05/10/21 1345  BP: 128/71 (!) 176/108 (!) 167/93 (!) 165/84  Pulse: 74 82 83 85  Resp: (!) 28 (!) 25 17 (!) 23  Temp:      SpO2: 97% 97% 97% 97%    Constitutional: NAD, calm, comfortable Vitals:   05/10/21 1130 05/10/21 1145 05/10/21 1315 05/10/21 1345  BP: 128/71 (!) 176/108 (!) 167/93 (!) 165/84  Pulse: 74 82 83 85  Resp: (!) 28 (!) 25 17 (!) 23  Temp:      SpO2: 97% 97% 97% 97%   Eyes: PERRL, lids and conjunctivae normal ENMT: Mucous membranes are moist. Posterior pharynx clear of any exudate or lesions.Normal dentition.  Neck: normal, supple, no masses, no thyromegaly Respiratory: clear to auscultation bilaterally, no wheezing, no crackles. Normal respiratory effort. No accessory muscle use.  Cardiovascular: Regular rate and rhythm, no murmurs / rubs / gallops. No extremity edema. 2+ pedal pulses. No carotid bruits.  Abdomen: no tenderness, no masses palpated. No hepatosplenomegaly. Bowel sounds positive.  Musculoskeletal: no clubbing / cyanosis. No joint deformity upper and lower extremities. Good ROM, no contractures. Normal muscle tone.  Skin: no rashes, lesions, ulcers. No induration Neurologic: CN 2-12 grossly intact. Sensation intact, DTR normal.  Strength 5/5 in all 4.  Psychiatric: Normal judgment and insight. Alert and oriented x 3. Normal mood.     Labs on Admission: I have personally reviewed following labs and imaging studies  CBC: Recent Labs  Lab 05/10/21 0606  HGB 14.6  HCT 43.0   Basic  Metabolic Panel: Recent Labs  Lab 05/10/21 0545 05/10/21 0606  NA 135 137  K 4.4 4.2  CL 104  --   CO2 24  --   GLUCOSE 360*  --   BUN 29*  --   CREATININE 2.04*  --   CALCIUM 8.4*  --   MG 1.9  --    GFR: CrCl cannot be calculated (Unknown ideal weight.). Liver Function Tests: Recent Labs  Lab 05/10/21 0545  AST 28  ALT 21  ALKPHOS 110  BILITOT 1.1  PROT 5.8*  ALBUMIN 3.2*   No results for input(s): LIPASE, AMYLASE in the last 168 hours. No results for input(s): AMMONIA in the last 168 hours. Coagulation Profile: Recent Labs  Lab 05/10/21 0545  INR 1.2   Cardiac Enzymes: No results for input(s): CKTOTAL, CKMB, CKMBINDEX, TROPONINI in the last 168 hours. BNP (last 3 results) No results for input(s): PROBNP in the last 8760 hours. HbA1C: No results for input(s): HGBA1C in the last 72 hours. CBG: Recent Labs  Lab 05/10/21 1324  GLUCAP 102*   Lipid Profile: No results for input(s): CHOL, HDL, LDLCALC, TRIG, CHOLHDL, LDLDIRECT in the last 72 hours. Thyroid Function Tests: No results for input(s): TSH, T4TOTAL, FREET4, T3FREE, THYROIDAB in the last 72 hours. Anemia Panel: No results for input(s): VITAMINB12, FOLATE, FERRITIN, TIBC, IRON, RETICCTPCT in the last 72 hours. Urine analysis:    Component Value Date/Time   COLORURINE YELLOW 01/29/2020 2234   APPEARANCEUR CLEAR 01/29/2020 2234   LABSPEC 1.016 01/29/2020 2234   PHURINE 6.0 01/29/2020 [place] 01/29/2020 2234   [place] 01/29/2020 2234   BILIRUBINUR NEGATIVE 01/29/2020 2234   KETONESUR NEGATIVE 01/29/2020 2234   PROTEINUR 100 (A) 01/29/2020 2234   UROBILINOGEN 0.2 10/02/2009 1604   NITRITE NEGATIVE 01/29/2020 2234    LEUKOCYTESUR NEGATIVE 01/29/2020 2234    Radiological Exams on Admission: CT Chest Wo Contrast  Result Date: 05/10/2021 CLINICAL DATA:  75 year old male with respiratory failure. Shortness of breath since yesterday. EXAM: CT CHEST WITHOUT CONTRAST TECHNIQUE: Multidetector CT imaging of the chest was performed following the standard protocol without IV contrast. COMPARISON:  Portable chest 0601 hours today. CT Abdomen and Pelvis 08/07/2018. FINDINGS: Cardiovascular: Prior CABG. Normal caliber thoracic aorta. Cardiac size is stable since 2019 and within normal limits. No pericardial effusion. Mediastinum/Nodes: Negative.  No mediastinal lymphadenopathy. Lungs/Pleura: Major airways are patent. There is minimal dependent lower lobe opacity bilaterally compatible with atelectasis or scarring. Borderline bilateral lower lobe bronchiectasis also. A tiny left lower lobe subpleural nodule on series 8, image 87 is stable since 2019 and benign. Tiny calcified granuloma in the superior segment of the right lower lobe. And there is stable mild pleural calcification in the medial right costophrenic angle. No pleural effusion or other abnormal pulmonary opacity. Upper Abdomen: Negative visible noncontrast liver, gallbladder, spleen, adrenal glands and bowel in the upper abdomen. Mild generalized pancreatic atrophy since 2019. Negative visible right renal upper pole. Musculoskeletal: Prior sternotomy. Intermittent advanced thoracic disc degeneration and vacuum disc. No acute osseous abnormality identified. IMPRESSION: 1. No acute pulmonary abnormality. Mild lung base scarring/atelectasis and occasional tiny pulmonary granulomas. 2. Prior CABG. Electronically Signed   By: Genevie Ann M.D.   On: 05/10/2021 09:34   DG Chest Port 1 View  Result Date: 05/10/2021 CLINICAL DATA:  75 year old male with history of increasing shortness of breath. EXAM: PORTABLE CHEST 1 VIEW COMPARISON:  Chest x-ray 04/10/2021. FINDINGS: Lung volumes  are normal. No consolidative airspace disease.  No pleural effusions. No pneumothorax. No pulmonary nodule or mass noted. Pulmonary vasculature and the cardiomediastinal silhouette are within normal limits. Atherosclerosis in the thoracic aorta. Status post median sternotomy for CABG. IMPRESSION: 1.  No radiographic evidence of acute cardiopulmonary disease. 2. Aortic atherosclerosis. Electronically Signed   By: Vinnie Langton M.D.   On: 05/10/2021 06:34    EKG: Independently reviewed. Sinus with frequent PVCs  Assessment/Plan Active Problems:   Syncope  (please populate well all problems here in Problem List. (For example, if patient is on BP meds at home and you resume or decide to hold them, it is a problem that needs to be her. Same for CAD, COPD, HLD and so on)   Recurrent syncope -Suspect cardiac cause, according to outpatient cardiology record, patient has Hx of sinus pauses but for <3 seconds. -Agreed with tele monitoring and if negative, consider outpatient loop/event recorder -As per cardiology recommendation, will hold off beta blocker for tonight. -Less likely from hypoglycemia or vasovagal, given lacking of prodrome. Check A1C, orthstatic vitals.  Exertional dyspnea -Unknown etiology, clinically euvolemic. Chest CT showed no signs of pulm congestion or pleural effusion. -ABG reassuring. -One dose of IV Lasix given in ED. Will hold further diuresis.  Chronic diastolic CHF -Probably euvolemic, monitor off further diuresis for now.  Frequent PVCs -Will monitor HR response while off beta blocker.  PAF -Patient can not recall whether he hit his head during this week's syncope episode, given he is on Eliquis, will order CT head without contrast before resuming Eliquis.  IDDM -Given his CKD status, plan to cut down his Lantus and check A1C. -Add sliding scale  CKD stage III -Euvolemic, no significant electrolyte or acid-base abnormalities  DVT prophylaxis: Eliquis Code  Status: Full code Family Communication: None at bedside Disposition Plan: Expect Less than 2 midnight hospital stay Consults called: Cardiology Admission status: Tele obs   Lequita Halt MD [facility] Pager [phone]  05/10/2021, 3:55 PM

## 2021-05-10 NOTE — ED Provider Notes (Signed)
[facility] Emergency Department Provider Note MRN:  [id number]  Arrival date & time: 05/10/21     Chief Complaint   Shortness of Breath   History of Present Illness   Fernando Hess is a 75 y.o. year-old male with a history of CAD, CKD presenting to the ED with chief complaint of shortness of breath.  Location: Lungs/chest Duration: 24 hours Onset: Gradual Timing: Constant Description: Feels like he cannot catch his breath Severity: Moderate to severe Exacerbating/Alleviating Factors: None, constant Associated Symptoms: Could not sleep last night due to the dyspnea Pertinent Negatives: Denies headache or vision change, no fever, no cough, no chest pain, no abdominal pain, no other complaints  Additional History: None  Review of Systems  A complete 10 system review of systems was obtained and all systems are negative except as noted in the HPI and PMH.   Patient's Health History    Past Medical History:  Diagnosis Date   Acute renal failure (ARF) ([place]) 09/24/2015   Atrial premature complexes    CAD (coronary artery disease) of artery bypass graft    Early occlusion of saphenous vein graft to intermediate and marginal branch in February 2007 following bypass grafting    CAD (coronary artery disease), native coronary artery cardiologist-- dr Liane Comber   hx NSTEMI 09-24-2015  s/p  CABG x5 on 10-01-2017;  post op STEMI inferolateral wall,  SVG OM1 and SVG OM2 occluded, distal OM occlusion the calpruit, treated medically // Myoview 7/21: no ischemia, EF 65, low risk    CKD (chronic kidney disease), stage III ([name])    Elevated troponin    Erectile dysfunction    Esophageal reflux    History of atrial fibrillation    post op CABG 10-02-2015   History of kidney stones    History of kidney stones    History of non-ST elevation myocardial infarction (NSTEMI) 09/24/2015   s/p  CABG x5   History of ST elevation myocardial infarction (STEMI) 10/22/2015    inferior wall,  post op CABG 10-02-2015   Hyperlipidemia    Hypertension    Kidney stone    h/o   Left ureteral stone    Mild atherosclerosis of both carotid arteries    Nephrolithiasis    per CT bilateral non-obstructive calculi   OSA (obstructive sleep apnea)    Peripheral artery disease    LE Arterial US 01/2019: R PTA and ATA occluded; L ATA occluded   RBBB (right bundle branch block)    Renal atrophy, right    Sleep apnea    wears cpap    ST elevation myocardial infarction (STEMI) of inferior wall ([place]) 10/22/2015   Type 2 diabetes mellitus treated with insulin ([place])    followed by pcp   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 ([place]) 03/01/2008   Wears glasses     Past Surgical History:  Procedure Laterality Date   [place] N/A 09/26/2015   Procedure: Left Heart Cath and Coronary Angiography;  Surgeon: Troy Sine, MD;  Location: [place] [facility];  Service: Cardiovascular;  Laterality: N/A;   CARDIAC CATHETERIZATION N/A 10/22/2015   Procedure: Left Heart Cath and Coronary Angiography;  Surgeon: Sherren Mocha, MD;  Location: [place] [facility];  Service: Cardiovascular;  Laterality: N/A;   CATARACT EXTRACTION W/ INTRAOCULAR LENS  IMPLANT, BILATERAL  2017   COLONOSCOPY     CORONARY ARTERY BYPASS GRAFT  N/A 10/02/2015   Procedure: CORONARY ARTERY BYPASS GRAFTING (CABG) X5 LIMA-LAD; SVG-DIAG; SVG-OM; SVG-PD; SVG-RAMUS TRANSESOPHAGEAL ECHOCARDIOGRAM (TEE) ENDOSCOPIC GREATER SAPHENOUS VEIN  HARVEST BILAT LE;  Surgeon: Ivin Poot, MD;  Location: [place];  Service: Open Heart Surgery;  Laterality: N/A;   CYSTOSCOPY/URETEROSCOPY/HOLMIUM LASER/STENT PLACEMENT Left 08/10/2018   Procedure: CYSTOSCOPY/URETEROSCOPY/HOLMIUM LASER/STENT PLACEMENT;  Surgeon: Festus Aloe, MD;  Location: [facility];  Service: Urology;  Laterality: Left;   CYSTOSCOPY/URETEROSCOPY/HOLMIUM  LASER/STENT PLACEMENT Left 09/10/2018   Procedure: CYSTOSCOPY/URETEROSCOPY/HOLMIUM LASER/STENT EXCHANGE;  Surgeon: Festus Aloe, MD;  Location: [facility];  Service: Urology;  Laterality: Left;   LEFT HEART CATHETERIZATION WITH CORONARY ANGIOGRAM N/A 04/13/2014   Procedure: LEFT HEART CATHETERIZATION WITH CORONARY ANGIOGRAM;  Surgeon: Jacolyn Reedy, MD;  Location: [facility];  Service: Cardiovascular;  Laterality: N/A;   LEG SURGERY Right age 1   closed reduction leg fracture   POLYPECTOMY     TEE WITHOUT CARDIOVERSION N/A 10/02/2015   Procedure: TRANSESOPHAGEAL ECHOCARDIOGRAM (TEE);  Surgeon: Ivin Poot, MD;  Location: [place];  Service: Open Heart Surgery;  Laterality: N/A;   URETEROSCOPY WITH HOLMIUM LASER LITHOTRIPSY Bilateral 2004;  2005  dr grapey  @[facility]    VASECTOMY      Family History  Problem Relation Age of Onset   Heart attack Mother    Heart attack Father    Diabetes Brother    Pancreatic cancer Brother    Diabetes Brother    Colon cancer Neg Hx    Esophageal cancer Neg Hx    Prostate cancer Neg Hx    Rectal cancer Neg Hx    Stomach cancer Neg Hx    Colon polyps Neg Hx     Social History   Socioeconomic History   Marital status: Married    Spouse name: Not on file   Number of children: Not on file   Years of education: Not on file   Highest education level: Not on file  Occupational History   Occupation: retired  Tobacco Use   Smoking status: Never   Smokeless tobacco: Never  Vaping Use   Vaping Use: Never used  Substance and Sexual Activity   Alcohol use: Not Currently   Drug use: Never   Sexual activity: Not on file  Other Topics Concern   Not on file  Social History Narrative   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   Social Determinants of Radio broadcast assistant Strain: Not on file  Food Insecurity: Not on file  Transportation Needs: Not on file  Physical Activity: Not on file  Stress: Not on file  Social  Connections: Not on file  Intimate Partner Violence: Not on file     Physical Exam   Vitals:   05/10/21 0800 05/10/21 0830  BP: (!) 151/76   Pulse: (!) 56 (!) 57  Resp: 18 17  Temp:    SpO2: 98% 97%    CONSTITUTIONAL: Well-appearing, NAD NEURO:  Alert and oriented x 3, no focal deficits EYES:  eyes equal and reactive ENT/NECK:  no LAD, no JVD CARDIO: Regular rate, well-perfused, normal S1 and S2 PULM: Tachypneic, lungs clear to auscultation GI/GU:  normal bowel sounds, non-distended, non-tender MSK/SPINE:  No gross deformities, no edema SKIN:  no rash, atraumatic PSYCH:  Appropriate speech and behavior  *Additional and/or pertinent findings included in MDM below  Diagnostic and Interventional Summary    EKG Interpretation  Date/Time:  Friday May 10 2021  05:48:23 EDT Ventricular Rate:  59 PR Interval:  55 QRS Duration: 97 QT Interval:  465 QTC Calculation: 461 R Axis:   45 Text Interpretation: Sinus rhythm Multiple premature complexes, vent & supraven Short PR interval Low voltage, precordial leads Confirmed by Gerlene Fee [phone]) on 05/10/2021 6:20:00 AM       Labs Reviewed  COMPREHENSIVE METABOLIC PANEL - Abnormal; Notable for the following components:      Result Value   Glucose, Bld 360 (*)    BUN 29 (*)    Creatinine, Ser 2.04 (*)    Calcium 8.4 (*)    Total Protein 5.8 (*)    Albumin 3.2 (*)    GFR, Estimated 33 (*)    All other components within normal limits  BRAIN NATRIURETIC PEPTIDE - Abnormal; Notable for the following components:   B Natriuretic Peptide 213.1 (*)    All other components within normal limits  RESP PANEL BY RT-PCR (FLU A&B, COVID) ARPGX2  PROTIME-INR  D-DIMER, QUANTITATIVE  MAGNESIUM  I-STAT VENOUS BLOOD GAS, ED  TROPONIN I (HIGH SENSITIVITY)  TROPONIN I (HIGH SENSITIVITY)    DG Chest Port 1 View  Final Result    CT Chest Wo Contrast    (Results Pending)    Medications - No data to display   Procedures  /  Critical  Care Procedures  ED Course and Medical Decision Making  I have reviewed the triage vital signs, the nursing notes, and pertinent available records from the EMR.  Listed above are laboratory and imaging tests that I personally ordered, reviewed, and interpreted and then considered in my medical decision making (see below for details).  Differential diagnosis includes fluid overloaded state/pulmonary edema, ACS, PE, metabolic acidosis with respiratory compensation.  Breathing 30 times per minute on my evaluation, normal oxygen saturations, not tachycardic, no fever, does not seem to be tiring out, no retractions.  Awaiting initial labs, chest x-ray.  Patient a poor candidate for CTA imaging given his renal function.  Patient also exhibiting quite a bit of ectopy on cardiac monitoring, and so also considering primary cardiac arrhythmia as a cause.     Work-up overall reassuring, troponin is negative, D-dimer negative, chest x-ray normal.  BNP mildly elevated.  Patient continues to be tachypneic.  Given the ectopy in the very different nature of the 2 twelve-lead EKGs, cardiology is consulted for recommendations.  Dr. Debara Pickett agrees to see patient in consultation.  We will also obtain CT chest Noncon to further delineate any pulmonary edema contributing to patient's dyspnea.  Signed out to oncoming provider at shift change.  Barth Kirks. [place] Small, [place] [email or web address]  Final Clinical Impressions(s) / ED Diagnoses     ICD-10-CM   1. Dyspnea, unspecified type  R06.00       ED Discharge Orders     None        Discharge Instructions Discussed with and Provided to Patient:   Discharge Instructions   None       Maudie Flakes, MD 05/10/21 [phone]

## 2021-05-10 NOTE — ED Notes (Signed)
Pts spouse Romie Minus left the bedside at this time and asked to be called with any updates. Cell number in demographics.

## 2021-05-10 NOTE — ED Triage Notes (Signed)
EMS arrival. Came from home. Sx 0600 yesterday. Satting at [employer] on room air per EMS.

## 2021-05-10 NOTE — Progress Notes (Signed)
  Echocardiogram 2D Echocardiogram has been performed.  Fernando Hess 05/10/2021, 4:32 PM

## 2021-05-10 NOTE — Consult Note (Addendum)
Cardiology Consultation:   Patient ID: Fernando Hess MRN: [id number]; DOB: Aug 18, 1946  Admit date: 05/10/2021 Date of Consult: 05/10/2021  PCP:  [name], MD   [facility] Cardiologist:  Werner Lean, MD  Electrophysiologist:  Vickie Epley, MD       Patient Profile:   Fernando Hess is a 75 y.o. male with a hx of MI/CABG 09/2015 w/ LIMA-LAD, SVG-Diag, SVG-RI, SVG-OM, SVG-PDA, STEMI 10/2015 2nd occluded SVG-OM1 & SVG-PDA >> med rx, nl MV 2021, DM, HTN, HLD, GERD, CKD III, OSA on CPAP, Afib on Eliquis w/ tachy/brady, who is being seen 05/10/2021 for the evaluation of SOB at the request of Dr Sedonia Small.  History of Present Illness:   Fernando Hess was seen by Dr Meda Coffee 12/04/2020 and was doing well. Wt 194 lbs, fewer dizzy spells, walking a mile at a time. Monitor had showed tachybrady w/ post-termination pauses about 3 sec. F/u with EP. He was only taking Eliquis qd >> bid was emphasized.  Pt wt has been up and down, between 195-200 lbs. Has not been waking w/ LE edema. He describes PND since his CABG, helped by CPAP, worse in the last 2 days. Also c/o worsening SOB in the last 2 days. Says has had some DOE since CABG, but worse very recently.   Cr was higher, but has improved, he sees Dr Justin Mend for this. CBG this am was 114, has not eaten.   No chest pain at rest or w/ exertion.   He had a syncopal episode this week, he stood up from a chair and woke up on the floor.  This has happened a few times in the last 6 months, has happened at times for years. Always when he is standing. Sometimes after standing a few minutes, sometimes, when he first stands up.  At first, the episodes resolved quickly. Now, he feels bad after he wakes up, for the rest of the day.   Only occasionally aware of the atrial fib, will feel a skipped beat or brief palpitation. Has not missed any doses of bid Eliquis.  The reason he came to the hospital was the SOB.   Past Medical  History:  Diagnosis Date   Acute renal failure (ARF) ([place]) 09/24/2015   Atrial premature complexes    CAD (coronary artery disease) of artery bypass graft    Early occlusion of saphenous vein graft to intermediate and marginal branch in February 2007 following bypass grafting    CAD (coronary artery disease), native coronary artery 2017   hx NSTEMI 09-24-2015  s/p  CABG x5 on 10-02-2015;  post op STEMI inferolateral wall,  SVG OM1 and SVG OM2 occluded, distal OM occlusion the calpruit, treated medically // Myoview 7/21: no ischemia, EF 65, low risk   CKD (chronic kidney disease), stage III ([name])    Elevated troponin    Erectile dysfunction    Esophageal reflux    History of atrial fibrillation    post op CABG 10-02-2015   History of non-ST elevation myocardial infarction (NSTEMI) 09/24/2015   s/p  CABG x5   History of ST elevation myocardial infarction (STEMI) 10/22/2015   inferior wall,  post op CABG 10-02-2015   Hyperlipidemia    Hypertension    Left ureteral stone    Mild atherosclerosis of both carotid arteries    Nephrolithiasis    per CT bilateral non-obstructive calculi   OSA (obstructive sleep apnea)    Peripheral artery disease    LE Arterial  US 01/2019: R PTA and ATA occluded; L ATA occluded   RBBB (right bundle branch block)    Renal atrophy, right    Sleep apnea    wears cpap    ST elevation myocardial infarction (STEMI) of inferior wall ([place]) 10/22/2015   Type 2 diabetes mellitus treated with insulin ([place])    followed by pcp   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 ([place]) 03/01/2008   Wears glasses     Past Surgical History:  Procedure Laterality Date   [place] N/A 09/26/2015   Procedure: Left Heart Cath and Coronary Angiography;  Surgeon: Troy Sine, MD;  Location: [facility];  Service: Cardiovascular;  Laterality: N/A;   CARDIAC  CATHETERIZATION N/A 10/22/2015   Procedure: Left Heart Cath and Coronary Angiography;  Surgeon: Sherren Mocha, MD;  Location: [facility];  Service: Cardiovascular;  Laterality: N/A;   CATARACT EXTRACTION W/ INTRAOCULAR LENS  IMPLANT, BILATERAL  2017   COLONOSCOPY     CORONARY ARTERY BYPASS GRAFT N/A 10/02/2015   Procedure: CORONARY ARTERY BYPASS GRAFTING (CABG) X5 LIMA-LAD; SVG-DIAG; SVG-OM; SVG-PD; SVG-RAMUS TRANSESOPHAGEAL ECHOCARDIOGRAM (TEE) ENDOSCOPIC GREATER SAPHENOUS VEIN  HARVEST BILAT LE;  Surgeon: Ivin Poot, MD;  Location: [facility];  Service: Open Heart Surgery;  Laterality: N/A;   CYSTOSCOPY/URETEROSCOPY/HOLMIUM LASER/STENT PLACEMENT Left 08/10/2018   Procedure: CYSTOSCOPY/URETEROSCOPY/HOLMIUM LASER/STENT PLACEMENT;  Surgeon: Festus Aloe, MD;  Location: [facility];  Service: Urology;  Laterality: Left;   CYSTOSCOPY/URETEROSCOPY/HOLMIUM LASER/STENT PLACEMENT Left 09/10/2018   Procedure: CYSTOSCOPY/URETEROSCOPY/HOLMIUM LASER/STENT EXCHANGE;  Surgeon: Festus Aloe, MD;  Location: [facility];  Service: Urology;  Laterality: Left;   LEFT HEART CATHETERIZATION WITH CORONARY ANGIOGRAM N/A 04/13/2014   Procedure: LEFT HEART CATHETERIZATION WITH CORONARY ANGIOGRAM;  Surgeon: Jacolyn Reedy, MD;  Location: [facility];  Service: Cardiovascular;  Laterality: N/A;   LEG SURGERY Right age 54   closed reduction leg fracture   POLYPECTOMY     TEE WITHOUT CARDIOVERSION N/A 10/02/2015   Procedure: TRANSESOPHAGEAL ECHOCARDIOGRAM (TEE);  Surgeon: Ivin Poot, MD;  Location: [facility];  Service: Open Heart Surgery;  Laterality: N/A;   URETEROSCOPY WITH HOLMIUM LASER LITHOTRIPSY Bilateral 2004;  2005  dr Risa Grill  @[facility]    VASECTOMY       Home Medications:  Prior to Admission medications   Medication Sig Start Date End Date Taking? Authorizing Provider  atorvastatin (LIPITOR) 40 MG tablet Take 40 mg by mouth daily.     [provider]  B-D UF III MINI PEN NEEDLES  31G X 5 MM MISC USE AS DIRECTED WITH LANTUS SOLOSTAR 04/02/19   [provider]  busPIRone (BUSPAR) 10 MG tablet Take 1 tablet (10 mg total) by mouth at bedtime. 01/25/20   Lomax, Amy, NP  DULoxetine (CYMBALTA) 30 MG capsule Take 30 mg by mouth at bedtime.    [provider]  DULoxetine (CYMBALTA) 60 MG capsule Take 60 mg by mouth daily.  04/12/18   [provider]  ELIQUIS 5 MG TABS tablet TAKE 1 TABLET BY MOUTH TWICE A DAY 03/27/21   [name], [name]  esomeprazole (NEXIUM) 20 MG capsule Take 20 mg by mouth as needed.    [provider]  furosemide (LASIX) 20 MG tablet Take 20 mg by mouth as needed for shortness of breath or edema.        [provider]  Insulin Glargine [facility]) 100  UNIT/ML SOPN Inject 31 Units into the skin daily.     [provider]  MAGNESIUM-ZINC PO Take 1 tablet by mouth daily.    [provider]  Melatonin 3 MG TABS Take 1 tablet by mouth daily as needed (for sleep).     [provider]  metoprolol succinate (TOPROL-XL) 25 MG 24 hr tablet TAKE 1/2 TABLET BY MOUTH EVERY DAY 03/17/19   Dorothy Spark, MD  Multiple Vitamin (MULTIVITAMIN) tablet Take 1 tablet by mouth daily.    [provider]  nitroGLYCERIN (NITROSTAT) 0.4 MG SL tablet Place 0.4 mg under the tongue every 5 (five) minutes as needed for chest pain.    [provider]  nystatin ointment (MYCOSTATIN) Apply 1 application topically as needed.  07/02/20   [provider]  ondansetron (ZOFRAN-ODT) 4 MG disintegrating tablet Take 4 mg by mouth every 8 (eight) hours as needed for nausea or vomiting.    [provider]  [facility] ULTRA test strip FOR USE OF [place] BLOOD ONCE DAILY 03/18/19   [provider]  pantoprazole (PROTONIX) 40 MG tablet TAKE 1 TABLET BY MOUTH EVERY DAY 11/25/19   [name], MD  pioglitazone (ACTOS) 45 MG tablet Take 45 mg by mouth daily.    [provider]     Inpatient Medications: Scheduled Meds:  Continuous Infusions:  PRN Meds:   Allergies:    Allergies  Allergen Reactions   Levaquin [Levofloxacin] Itching and Rash   Lisinopril Itching and Rash   Victoza [Liraglutide]     Severe fatigue & insomnia    Social History:   Social History   Socioeconomic History   Marital status: Married    Spouse name: Not on file   Number of children: Not on file   Years of education: Not on file   Highest education level: Not on file  Occupational History   Occupation: retired  Tobacco Use   Smoking status: Never   Smokeless tobacco: Never  Vaping Use   Vaping Use: Never used  Substance and Sexual Activity   Alcohol use: Not Currently   Drug use: Never   Sexual activity: Not on file  Other Topics Concern   Not on file  Social History Narrative   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   Social Determinants of Radio broadcast assistant Strain: Not on file  Food Insecurity: Not on file  Transportation Needs: Not on file  Physical Activity: Not on file  Stress: Not on file  Social Connections: Not on file  Intimate Partner Violence: Not on file    Family History:   Family History  Problem Relation Age of Onset   Heart attack Mother    Heart attack Father    Diabetes Brother    Pancreatic cancer Brother    Diabetes Brother    Colon cancer Neg Hx    Esophageal cancer Neg Hx    Prostate cancer Neg Hx    Rectal cancer Neg Hx    Stomach cancer Neg Hx    Colon polyps Neg Hx      ROS:  Please see the history of present illness.  All other ROS reviewed and negative.     Physical Exam/Data:   Vitals:   05/10/21 0900 05/10/21 1130 05/10/21 1145 05/10/21 1315  BP: 131/77 128/71 (!) 176/108 (!) 167/93  Pulse: 64 74 82 83  Resp: 19 (!) 28 (!) 25 17  Temp:  SpO2: 96% 97% 97% 97%   No intake or output data in the 24 hours ending 05/10/21 1343 Last 3 Weights 12/04/2020 08/28/2020 08/06/2020   Weight (lbs) 194 lb 3.2 oz 195 lb 190 lb  Weight (kg) 88.089 kg 88.451 kg 86.183 kg     There is no height or weight on file to calculate BMI.  General:  Well nourished, well developed, in no acute distress at rest HEENT: normal for age Lymph: no adenopathy Neck: JVD 10 cm Endocrine:  No thryomegaly Vascular: No carotid bruits; 4/4 extremity pulses 2+  Cardiac:  normal S1, S2; RRR; 2/6 murmur  Lungs: slightly decreased BS bases bilaterally, no wheezing, rhonchi, few rales R Abd: soft, nontender, no hepatomegaly  Ext: no edema Musculoskeletal:  No deformities, BUE and BLE strength normal and equal Skin: warm and dry  Neuro:  CNs 2-12 intact, no focal abnormalities noted Psych:  Normal affect   EKG:  The EKG was personally reviewed and demonstrates:  SR, HR 59, variable PR intervals and ?non-conducted PACs, ?Wenkebach at times Telemetry:  Telemetry was personally reviewed and demonstrates:  SR, sinus brady, non-conducted PACs, occ morphology changes, occ change in PR interval  Relevant CV Studies:  30-DAY MONITOR: 05/09/2021 Sinus bradycardia with minimum rate 48 BPM, pauses up to 3 seconds even during awake hours. Frequent epsiodes of atrial fibrillation with RVR (2% of the monitoring time).   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     MYOVIEW: 03/20/2020 The left ventricular ejection fraction is normal (55-65%). Nuclear stress EF: 65%. There was no ST segment deviation noted during stress. No T wave inversion was noted during stress. This is a low risk study. No significant change since prior study.   Small defect of mild severity at apex, similar to prior. Given normal wall motion, suspect apical thinning artifact. No ischemia.  ECHO: 11/19/2019  1. Normal LV systolic function; moderate LVH; grade 1 diastolic  dysfunction; mildly dilated ascending aorta; mild AI and MR.   2. Left ventricular ejection  fraction, by estimation, is 60 to 65%. The  left ventricle has normal function. The left ventricle has no regional  wall motion abnormalities. There is moderate left ventricular hypertrophy.  Left ventricular diastolic  parameters are consistent with Grade I diastolic dysfunction (impaired  relaxation).   3. Right ventricular systolic function is normal. The right ventricular  size is normal.   4. The mitral valve is normal in structure and function. Mild mitral  valve regurgitation. No evidence of mitral stenosis.   5. The aortic valve is normal in structure and function. Aortic valve  regurgitation is mild. Mild to moderate aortic valve  sclerosis/calcification is present, without any evidence of aortic  stenosis.   6. Aortic dilatation noted. There is mild dilatation of the ascending  aorta measuring 39 mm.   7. The inferior vena cava is normal in size with greater than 50%  respiratory variability, suggesting right atrial pressure of 3 mmHg.   CARDIAC CATH: 10/22/2015 Ost LM to LM lesion, 50% stenosed. Mid LAD lesion, 90% stenosed. The LIMA to LAD graft is widely patent Mid RCA lesion, 50% stenosed. SVG . The saphenous vein graft to right PDA is widely patent without stenosis SVG . The saphenous vein graft to first diagonal is widely patent. This graft retrograde fills the LAD territory, with relatively brisk flow into the proximal septal perforators. Ost Ramus to Ramus lesion, 90% stenosed. Ost 1st Mrg to  1st Mrg lesion, 80% stenosed. Prox Cx lesion, 80% stenosed. 2nd Mrg lesion, 100% stenosed. SVG . This graft is occluded just beyond the aortic anastomosis This graft is occluded in the proximal body Origin to Prox Graft lesion, 100% stenosed. Prox Graft lesion, 100% stenosed.   1. 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 S/P recent CABG with patency of the LIMA-LAD, SVG-PDA, and SVG-diagonal 3. Total  occlusion of the SVG-OM1 and SVG-OM2 4. Total occlusion of the distal OM2, suspected culprit for STEMI   Recommend: medical therapy, add clopidogrel, resume heparin in 8 hrs. With diffuse diabetic CAD, may be best to manage medically rather than proceed with complex PCI of the left circumflex branches. I felt best not to acutely intervene on the distal OM2 as the vessel terminates not far beyond the area of occlusion based on preop coronary angiogram review.   Diagnostic Dominance: Right   Laboratory Data:  High Sensitivity Troponin:   Recent Labs  Lab 05/10/21 0545 05/10/21 0748  TROPONINIHS 9 9     Chemistry Recent Labs  Lab 05/10/21 0545 05/10/21 0606  NA 135 137  K 4.4 4.2  CL 104  --   CO2 24  --   GLUCOSE 360*  --   BUN 29*  --   CREATININE 2.04*  --   CALCIUM 8.4*  --   GFRNONAA 33*  --   ANIONGAP 7  --     Recent Labs  Lab 05/10/21 0545  PROT 5.8*  ALBUMIN 3.2*  AST 28  ALT 21  ALKPHOS 110  BILITOT 1.1   Hematology Recent Labs  Lab 05/10/21 0606  HGB 14.6  HCT 43.0   BNP Recent Labs  Lab 05/10/21 0545  BNP 213.1*    DDimer  Recent Labs  Lab 05/10/21 0545  DDIMER <0.27   Lab Results  Component Value Date   TSH 0.815 06/11/2020   Lab Results  Component Value Date   HGBA1C 11.1 (H) 06/11/2020   Lab Results  Component Value Date   CHOL 129 06/11/2020   HDL 41 06/11/2020   LDLCALC 72 06/11/2020   TRIG 84 06/11/2020   CHOLHDL 3.1 06/11/2020     Radiology/Studies:  CT Chest Wo Contrast  Result Date: 05/10/2021 CLINICAL DATA:  75 year old male with respiratory failure. Shortness of breath since yesterday. EXAM: CT CHEST WITHOUT CONTRAST TECHNIQUE: Multidetector CT imaging of the chest was performed following the standard protocol without IV contrast. COMPARISON:  Portable chest 0601 hours today. CT Abdomen and Pelvis 08/07/2018. FINDINGS: Cardiovascular: Prior CABG. Normal caliber thoracic aorta. Cardiac size is stable since 2019 and  within normal limits. No pericardial effusion. Mediastinum/Nodes: Negative.  No mediastinal lymphadenopathy. Lungs/Pleura: Major airways are patent. There is minimal dependent lower lobe opacity bilaterally compatible with atelectasis or scarring. Borderline bilateral lower lobe bronchiectasis also. A tiny left lower lobe subpleural nodule on series 8, image 87 is stable since 2019 and benign. Tiny calcified granuloma in the superior segment of the right lower lobe. And there is stable mild pleural calcification in the medial right costophrenic angle. No pleural effusion or other abnormal pulmonary opacity. Upper Abdomen: Negative visible noncontrast liver, gallbladder, spleen, adrenal glands and bowel in the upper abdomen. Mild generalized pancreatic atrophy since 2019. Negative visible right renal upper pole. Musculoskeletal: Prior sternotomy. Intermittent advanced thoracic disc degeneration and vacuum disc. No acute osseous abnormality identified. IMPRESSION: 1. No acute pulmonary abnormality. Mild lung base scarring/atelectasis and  occasional tiny pulmonary granulomas. 2. Prior CABG. Electronically Signed   By: Genevie Ann M.D.   On: 05/10/2021 09:34   DG Chest Port 1 View  Result Date: 05/10/2021 CLINICAL DATA:  75 year old male with history of increasing shortness of breath. EXAM: PORTABLE CHEST 1 VIEW COMPARISON:  Chest x-ray 04/10/2021. FINDINGS: Lung volumes are normal. No consolidative airspace disease. No pleural effusions. No pneumothorax. No pulmonary nodule or mass noted. Pulmonary vasculature and the cardiomediastinal silhouette are within normal limits. Atherosclerosis in the thoracic aorta. Status post median sternotomy for CABG. IMPRESSION: 1.  No radiographic evidence of acute cardiopulmonary disease. 2. Aortic atherosclerosis. Electronically Signed   By: Vinnie Langton M.D.   On: 05/10/2021 06:34     Assessment and Plan:   DOE - BNP mildly elevated, +JVD, but no edema on CXR - per pt, no  change in wt - says has not missed doses of Eliquis - normal EF last year >> recheck - takes Lasix 20 mg po qd prn swelling but has not had it recently - could give Lasix 40 mg IV x 1 and assess response  2. CAD: - Had early graft closure in 2017, 1 month after CABG (SVG-OM and SVG-RI were occluded) - rx medically, no recent c/o chest pain - had CP in 2017 - on low-dose metop XL 12.5 mg qd and Lipitor 40 mg qd - no ASA due to Eliquis - MV in 2021 was OK - concern that SOB is anginal equivalent, although he had CP in the past when he had MI  3. Syncope, PAF - recent monitor is misleading, HR read as 50s, but he actually has HR 30s for several seconds, most of the low HR are while he is asleep - d/c BB and follow - may need EP consult  4. DM - he admits that he does not stick tightly to a diabetic low Na diet.  - some of his syncopal episode have been possibly related to high or low blood sugars - Dr Harrington Challenger recently did blood work, he believes his A1c is still 11 - these and other issues per IM  5. CKD III - sees Dr Justin Mend - follow w/ diuresis   Risk Assessment/Risk Scores:      New York Heart Association (NYHA) Functional Class NYHA Class III  CHA2DS2-VASc Score = 6  This indicates a 9.7% annual risk of stroke. The patient's score is based upon: CHF History: Yes HTN History: Yes Diabetes History: Yes Stroke History: No Vascular Disease History: Yes Age Score: 2 Gender Score: 0    For questions or updates, please contact [place] Please consult [email or web address] for contact info under    Signed, Rosaria Ferries, [name]  05/10/2021 1:43 PM  Personally seen and examined. Agree with APP above with the following comments: Briefly 75 yo MI s/p CABG 2017 with residual occlusions, and neck pain has his anginal equivalent, HLD, CKD stage IIIb, HTN and DM with OSA on CPAP, AF, and recent pauses with possible symptoms.  Seen for two days of shortness of breath.  Patient  notes that e is feeling off the the past two days.  Has had no chest pain, chest pressure, chest tightness, chest stinging.  Prior anginal equivalent was left and right neck pain.    Discomfort occurs as DOE and lasts for the past two days.  No change in activity.  Patient exertion notable for being able to bend over with out bendopnea.  No fevers, chills, night sweats.  No PND or orthopnea unless he doesn't have CPAP (story changes through exam .  No weight gain, leg swelling , or abdominal swelling.  Notes syncope with pauses seen on heart monitor.  No Palpitations in AF.  No recent AF on the monitor.  Exam notable for mild elevation on JVD, decreased breath sounds bilaterally.  Irregular hear beat on occasion.  NO crackles on exam on significant pitting edema Labs notable for glucose 360 (fasting), K 4.4, GFR 33 (similar to slightly worse) D dimer negative troponin negative.   Personally reviewed relevant tests, intermittent RBBB with sinus pause greatest of 4 s here, with high pause burden on telemtry. Would recommend  - SOB does not appear to be anginal equivalent; normal troponin, will trial 40 IV lasix IV; will gently watch kidney function  - hold BB unclear if syncope is mediated rhythm or BG - 6 month driving delay unless eval shows key etiology - CPAP start - Repeat echo - holding BB  Rudean Haskell, MD [facility]  [address] [place], [place] [address] [phone]  3:22 PM

## 2021-05-10 NOTE — ED Notes (Signed)
Pt off the floor to CT at this time

## 2021-05-10 NOTE — ED Notes (Addendum)
updated wife on plan of care. Wife asked this RN to put a note in the chart that states she would like pt's breathing to be evaluated by a respiratory therapist. Pt also states that he has been unable to wear CPAP at night because of his breathing.

## 2021-05-11 ENCOUNTER — Encounter (HOSPITAL_COMMUNITY): Payer: Self-pay | Admitting: Internal Medicine

## 2021-05-11 DIAGNOSIS — I5031 Acute diastolic (congestive) heart failure: Secondary | ICD-10-CM | POA: Diagnosis not present

## 2021-05-11 DIAGNOSIS — N1832 Chronic kidney disease, stage 3b: Secondary | ICD-10-CM | POA: Diagnosis present

## 2021-05-11 DIAGNOSIS — R0600 Dyspnea, unspecified: Secondary | ICD-10-CM | POA: Diagnosis not present

## 2021-05-11 DIAGNOSIS — Y92009 Unspecified place in unspecified non-institutional (private) residence as the place of occurrence of the external cause: Secondary | ICD-10-CM | POA: Diagnosis not present

## 2021-05-11 DIAGNOSIS — T502X5A Adverse effect of carbonic-anhydrase inhibitors, benzothiadiazides and other diuretics, initial encounter: Secondary | ICD-10-CM | POA: Diagnosis present

## 2021-05-11 DIAGNOSIS — I5033 Acute on chronic diastolic (congestive) heart failure: Secondary | ICD-10-CM | POA: Diagnosis present

## 2021-05-11 DIAGNOSIS — R001 Bradycardia, unspecified: Secondary | ICD-10-CM | POA: Diagnosis present

## 2021-05-11 DIAGNOSIS — E1122 Type 2 diabetes mellitus with diabetic chronic kidney disease: Secondary | ICD-10-CM | POA: Diagnosis present

## 2021-05-11 DIAGNOSIS — Z794 Long term (current) use of insulin: Secondary | ICD-10-CM | POA: Diagnosis not present

## 2021-05-11 DIAGNOSIS — T465X5A Adverse effect of other antihypertensive drugs, initial encounter: Secondary | ICD-10-CM | POA: Diagnosis present

## 2021-05-11 DIAGNOSIS — Z888 Allergy status to other drugs, medicaments and biological substances status: Secondary | ICD-10-CM | POA: Diagnosis not present

## 2021-05-11 DIAGNOSIS — E669 Obesity, unspecified: Secondary | ICD-10-CM | POA: Diagnosis present

## 2021-05-11 DIAGNOSIS — G4730 Sleep apnea, unspecified: Secondary | ICD-10-CM | POA: Diagnosis present

## 2021-05-11 DIAGNOSIS — G4733 Obstructive sleep apnea (adult) (pediatric): Secondary | ICD-10-CM | POA: Diagnosis present

## 2021-05-11 DIAGNOSIS — I951 Orthostatic hypotension: Secondary | ICD-10-CM | POA: Diagnosis present

## 2021-05-11 DIAGNOSIS — I2510 Atherosclerotic heart disease of native coronary artery without angina pectoris: Secondary | ICD-10-CM

## 2021-05-11 DIAGNOSIS — I480 Paroxysmal atrial fibrillation: Secondary | ICD-10-CM | POA: Diagnosis present

## 2021-05-11 DIAGNOSIS — E1165 Type 2 diabetes mellitus with hyperglycemia: Secondary | ICD-10-CM | POA: Diagnosis present

## 2021-05-11 DIAGNOSIS — R55 Syncope and collapse: Secondary | ICD-10-CM | POA: Diagnosis present

## 2021-05-11 DIAGNOSIS — I350 Nonrheumatic aortic (valve) stenosis: Secondary | ICD-10-CM | POA: Diagnosis present

## 2021-05-11 DIAGNOSIS — I252 Old myocardial infarction: Secondary | ICD-10-CM | POA: Diagnosis not present

## 2021-05-11 DIAGNOSIS — Z951 Presence of aortocoronary bypass graft: Secondary | ICD-10-CM | POA: Diagnosis not present

## 2021-05-11 DIAGNOSIS — Z7901 Long term (current) use of anticoagulants: Secondary | ICD-10-CM | POA: Diagnosis not present

## 2021-05-11 DIAGNOSIS — Z6831 Body mass index (BMI) 31.0-31.9, adult: Secondary | ICD-10-CM | POA: Diagnosis not present

## 2021-05-11 DIAGNOSIS — E785 Hyperlipidemia, unspecified: Secondary | ICD-10-CM | POA: Diagnosis present

## 2021-05-11 DIAGNOSIS — E1151 Type 2 diabetes mellitus with diabetic peripheral angiopathy without gangrene: Secondary | ICD-10-CM | POA: Diagnosis present

## 2021-05-11 DIAGNOSIS — Z20822 Contact with and (suspected) exposure to covid-19: Secondary | ICD-10-CM | POA: Diagnosis present

## 2021-05-11 DIAGNOSIS — I130 Hypertensive heart and chronic kidney disease with heart failure and stage 1 through stage 4 chronic kidney disease, or unspecified chronic kidney disease: Secondary | ICD-10-CM | POA: Diagnosis present

## 2021-05-11 LAB — BASIC METABOLIC PANEL
Anion gap: 5 (ref 5–15)
BUN: 26 mg/dL — ABNORMAL HIGH (ref 8–23)
CO2: 31 mmol/L (ref 22–32)
Calcium: 8.9 mg/dL (ref 8.9–10.3)
Chloride: 103 mmol/L (ref 98–111)
Creatinine, Ser: 1.91 mg/dL — ABNORMAL HIGH (ref 0.61–1.24)
GFR, Estimated: 36 mL/min — ABNORMAL LOW (ref >60)
Glucose, Bld: 205 mg/dL — ABNORMAL HIGH (ref 70–99)
Potassium: 4.8 mmol/L (ref 3.5–5.1)
Sodium: 139 mmol/L (ref 135–145)

## 2021-05-11 LAB — GLUCOSE, CAPILLARY
Glucose-Capillary: 200 mg/dL — ABNORMAL HIGH (ref 70–99)
Glucose-Capillary: 196 mg/dL — ABNORMAL HIGH (ref 70–99)
Glucose-Capillary: 272 mg/dL — ABNORMAL HIGH (ref 70–99)
Glucose-Capillary: 125 mg/dL — ABNORMAL HIGH (ref 70–99)
Glucose-Capillary: 139 mg/dL — ABNORMAL HIGH (ref 70–99)

## 2021-05-11 LAB — HEMOGLOBIN A1C
Hgb A1c MFr Bld: 11.5 % — ABNORMAL HIGH (ref 4.8–5.6)
Mean Plasma Glucose: 283.35 mg/dL

## 2021-05-11 MED ORDER — FUROSEMIDE 10 MG/ML IJ SOLN
40.0000 mg | Freq: Once | INTRAMUSCULAR | Status: AC
  Administered 2021-05-11: 40 mg via INTRAVENOUS
  Filled 2021-05-11: qty 4

## 2021-05-11 NOTE — Plan of Care (Signed)
°  Problem: Education: °Goal: Knowledge of General Education information will improve °Description: Including pain rating scale, medication(s)/side effects and non-pharmacologic comfort measures °Outcome: Progressing °  °Problem: Clinical Measurements: °Goal: Ability to maintain clinical measurements within normal limits will improve °Outcome: Progressing °Goal: Respiratory complications will improve °Outcome: Progressing °Goal: Cardiovascular complication will be avoided °Outcome: Progressing °  °Problem: Activity: °Goal: Risk for activity intolerance will decrease °Outcome: Progressing °  °Problem: Nutrition: °Goal: Adequate nutrition will be maintained °Outcome: Progressing °  °

## 2021-05-11 NOTE — Progress Notes (Addendum)
   05/11/21 0903  Orthostatic Lying   BP- Lying (!) 163/93  Pulse- Lying 64  Orthostatic Sitting  BP- Sitting 147/77  Pulse- Sitting 75  Orthostatic Standing at 0 minutes  BP- Standing at 0 minutes (!) 79/63  Pulse- Standing at 0 minutes 95  Orthostatic Standing at 3 minutes  BP- Standing at 3 minutes 104/73  Pulse- Standing at 3 minutes 88    Patient reported dizziness from a lying to sitting position. No dizziness was reported on standing, because per patient "sitting first helps me". Dr.Ghimire notified of orthostatic results.

## 2021-05-11 NOTE — Plan of Care (Signed)
  Problem: Clinical Measurements: Goal: Ability to maintain clinical measurements within normal limits will improve Outcome: Progressing Goal: Respiratory complications will improve Outcome: Progressing Goal: Cardiovascular complication will be avoided Outcome: Progressing   Problem: Activity: Goal: Risk for activity intolerance will decrease Outcome: Progressing   Problem: Nutrition: Goal: Adequate nutrition will be maintained Outcome: Progressing

## 2021-05-11 NOTE — Progress Notes (Addendum)
PROGRESS NOTE        PATIENT DETAILS Name: Fernando Hess Age: 75 y.o. Sex: male Date of Birth: [birth year] Admit Date: 05/10/2021 Admitting Physician Evalee Mutton Kristeen Mans, MD [name], [name], MD  Brief Narrative: Patient is a 75 y.o. male with history of CAD s/p CABG 2017, PAF on Eliquis, HTN, DM-2, CKD stage IIIb, OSA on CPAP-who presented for evaluation of syncopal episodes and shortness of breath.  See below for further details.  Significant events: 8/26>> admit for evaluation of syncope and exertional dyspnea.  Significant studies: 8/26>> CT head: No acute intracranial abnormality. 8/26>> CXR: No pneumonia 8/26>> Echo: EF 75-10%, grade 1 diastolic dysfunction, mild aortic stenosis  Antimicrobial therapy: None  Microbiology data: 8/26>> influenza/COVID PCR: Negative  Procedures : None  Consults: Cardiology.  DVT Prophylaxis :Place TED hose Start: 05/11/21 1400 apixaban (ELIQUIS) tablet 5 mg    Subjective: Feels better-was orthostatic this morning.  Thinks the shortness of breath is improved when he ambulates.   Assessment/Plan: Exertional dyspnea felt to be due to HFpEF exacerbation: Symptomatically better after empiric IV diuresis-even though he did not have excessive volume overload on exam.  Cardiology plans to give 1 additional dose of IV Lasix today and transition to oral diuretics tomorrow.  Discussed with cardiologist Dr. Sheppard Plumber additional day of hospitalization and reassessment on 8/28 before consideration of discharge.  Syncope: Likely due to orthostatic mechanism (not sure if this is from diuretics/antihypertensives or from autonomic dysfunction due to diabetes).  No longer on beta-blocker (also had bradycardia).  Echo with preserved EF-telemetry without any major arrhythmias.  Cardiology plans outpatient heart monitoring.  Nursing staff to place thigh-high TED hose.  Repeat orthostatic vital signs in the  morning.    PAF: No longer on beta-blocker-on Eliquis.  DM-2 (A1c 11.5 on 8/27): CBGs stable-on Lantus 34/14 units in am/pm, NovoLog  4 units scheduled with meals, and SSI.  Follow and adjust.  Recent Labs    05/11/21 0252 05/11/21 0848 05/11/21 1243  GLUCAP 200* 196* 272*    HLD: Continue statin  CKD stage IIIb: Creatinine close to baseline-follow periodically-avoid nephrotoxic agents  Obesity: Estimated body mass index is 31.34 kg/m as calculated from the following:   Height as of this encounter: 5\' 6"  (1.676 m).   Weight as of 12/04/20: 88.1 kg.   Diet: Diet Order             Diet renal/carb modified with fluid restriction Diet-HS Snack? Nothing; Fluid restriction: 1200 mL Fluid; Room service appropriate? No; Fluid consistency: Thin  Diet effective now                    Code Status: Full code  Family Communication: None bedside   Disposition Plan: Status is: Observation  The patient will require care spanning > 2 midnights and should be moved to inpatient because: Inpatient level of care appropriate due to severity of illness  Dispo: The patient is from: Home              Anticipated d/c is to: Home              Patient currently is not medically stable to d/c.   Difficult to place patient No   Barriers to Discharge: CHF exacerbation-on IV Lasix-severe orthostatic hypotension-needs further optimization of medications before consideration of discharge.   Antimicrobial agents: Anti-infectives (  From admission, onward)    None        Time spent: 44 minutes-Greater than 50% of this time was spent in counseling, explanation of diagnosis, planning of further management, and coordination of care.  MEDICATIONS: Scheduled Meds:  apixaban  5 mg Oral BID   atorvastatin  40 mg Oral QPM   DULoxetine  90 mg Oral Daily   insulin aspart  0-9 Units Subcutaneous TID WC   insulin aspart  4 Units Subcutaneous Q lunch   insulin glargine-yfgn  14 Units  Subcutaneous QHS   insulin glargine-yfgn  34 Units Subcutaneous Daily   pantoprazole  40 mg Oral Daily   pioglitazone  45 mg Oral q morning   sodium chloride flush  3 mL Intravenous Q12H   Continuous Infusions: PRN Meds:.acetaminophen, acetaminophen-codeine, hydrALAZINE, hydrOXYzine, melatonin, ondansetron   PHYSICAL EXAM: Vital signs: Vitals:   05/11/21 0254 05/11/21 0600 05/11/21 0824 05/11/21 1317  BP: (!) 157/116  134/76 126/68  Pulse: 71  74 75  Resp: 20  18 18   Temp: 98.3 F (36.8 C)  98.5 F (36.9 C) 98.2 F (36.8 C)  TempSrc: Oral  Oral Oral  SpO2: 96%  95% 92%  Height:  5\' 6"  (1.676 m)     There were no vitals filed for this visit. Body mass index is 31.34 kg/m.   Gen Exam:Alert awake-not in any distress HEENT:atraumatic, normocephalic Chest: B/L clear to auscultation anteriorly CVS:S1S2 regular Abdomen:soft non tender, non distended Extremities:no edema Neurology: Non focal Skin: no rash  I have personally reviewed following labs and imaging studies  LABORATORY DATA: CBC: Recent Labs  Lab 05/10/21 0606  HGB 14.6  HCT 52.8    Basic Metabolic Panel: Recent Labs  Lab 05/10/21 0545 05/10/21 0606 05/11/21 0616  NA 135 137 139  K 4.4 4.2 4.8  CL 104  --  103  CO2 24  --  31  GLUCOSE 360*  --  205*  BUN 29*  --  26*  CREATININE 2.04*  --  1.91*  CALCIUM 8.4*  --  8.9  MG 1.9  --   --     GFR: CrCl cannot be calculated (Unknown ideal weight.).  Liver Function Tests: Recent Labs  Lab 05/10/21 0545  AST 28  ALT 21  ALKPHOS 110  BILITOT 1.1  PROT 5.8*  ALBUMIN 3.2*   No results for input(s): LIPASE, AMYLASE in the last 168 hours. No results for input(s): AMMONIA in the last 168 hours.  Coagulation Profile: Recent Labs  Lab 05/10/21 0545  INR 1.2    Cardiac Enzymes: No results for input(s): CKTOTAL, CKMB, CKMBINDEX, TROPONINI in the last 168 hours.  BNP (last 3 results) No results for input(s): PROBNP in the last 8760  hours.  Lipid Profile: No results for input(s): CHOL, HDL, LDLCALC, TRIG, CHOLHDL, LDLDIRECT in the last 72 hours.  Thyroid Function Tests: No results for input(s): TSH, T4TOTAL, FREET4, T3FREE, THYROIDAB in the last 72 hours.  Anemia Panel: No results for input(s): VITAMINB12, FOLATE, FERRITIN, TIBC, IRON, RETICCTPCT in the last 72 hours.  Urine analysis:    Component Value Date/Time   COLORURINE YELLOW 01/29/2020 2234   APPEARANCEUR CLEAR 01/29/2020 2234   LABSPEC 1.016 01/29/2020 2234   PHURINE 6.0 01/29/2020 2234   GLUCOSEU NEGATIVE 01/29/2020 2234   HGBUR NEGATIVE 01/29/2020 2234   BILIRUBINUR NEGATIVE 01/29/2020 2234   KETONESUR NEGATIVE 01/29/2020 2234   PROTEINUR 100 (A) 01/29/2020 2234   UROBILINOGEN 0.2 10/02/2009 1604   NITRITE NEGATIVE  01/29/2020 Tensed 01/29/2020 2234    Sepsis Labs: Lactic Acid, Venous    Component Value Date/Time   LATICACIDVEN 1.5 03/01/2008 1730    MICROBIOLOGY: Recent Results (from the past 240 hour(s))  Resp Panel by RT-PCR (Flu A&B, Covid) Nasopharyngeal Swab     Status: None   Collection Time: 05/10/21  8:13 AM   Specimen: Nasopharyngeal Swab; Nasopharyngeal(NP) swabs in vial transport medium  Result Value Ref Range Status   SARS Coronavirus 2 by RT PCR NEGATIVE NEGATIVE Final    Comment: (NOTE) SARS-CoV-2 target nucleic acids are NOT DETECTED.  The SARS-CoV-2 RNA is generally detectable in upper respiratory specimens during the acute phase of infection. The lowest concentration of SARS-CoV-2 viral copies this assay can detect is 138 copies/mL. A negative result does not preclude SARS-Cov-2 infection and should not be used as the sole basis for treatment or other patient management decisions. A negative result may occur with  improper specimen collection/handling, submission of specimen other than nasopharyngeal swab, presence of viral mutation(s) within the areas targeted by this assay, and inadequate  number of viral copies(<138 copies/mL). A negative result must be combined with clinical observations, patient history, and epidemiological information. The expected result is Negative.  Fact Sheet for Patients:  EntrepreneurPulse.com.au  Fact Sheet for Healthcare Providers:  IncredibleEmployment.be  This test is no t yet approved or cleared by the [place] FDA and  has been authorized for detection and/or diagnosis of SARS-CoV-2 by FDA under an Emergency Use Authorization (EUA). This EUA will remain  in effect (meaning this test can be used) for the duration of the COVID-19 declaration under Section 564(b)(1) of the Act, 21 U.S.C.section 360bbb-3(b)(1), unless the authorization is terminated  or revoked sooner.       Influenza A by PCR NEGATIVE NEGATIVE Final   Influenza B by PCR NEGATIVE NEGATIVE Final    Comment: (NOTE) The Xpert Xpress SARS-CoV-2/FLU/RSV plus assay is intended as an aid in the diagnosis of influenza from Nasopharyngeal swab specimens and should not be used as a sole basis for treatment. Nasal washings and aspirates are unacceptable for Xpert Xpress SARS-CoV-2/FLU/RSV testing.  Fact Sheet for Patients: EntrepreneurPulse.com.au  Fact Sheet for Healthcare Providers: IncredibleEmployment.be  This test is not yet approved or cleared by the [place] FDA and has been authorized for detection and/or diagnosis of SARS-CoV-2 by FDA under an Emergency Use Authorization (EUA). This EUA will remain in effect (meaning this test can be used) for the duration of the COVID-19 declaration under Section 564(b)(1) of the Act, 21 U.S.C. section 360bbb-3(b)(1), unless the authorization is terminated or revoked.  Performed at [facility], [address], [place], [place] [address]     RADIOLOGY STUDIES/RESULTS: CT HEAD WO CONTRAST (5MM)  Result Date: 05/10/2021 CLINICAL DATA:   Intermittent shortness of breath, heart palpitations, head trauma EXAM: CT HEAD WITHOUT CONTRAST TECHNIQUE: Contiguous axial images were obtained from the base of the skull through the vertex without intravenous contrast. COMPARISON:  10/13/2018 FINDINGS: Brain: No acute infarct or hemorrhage. Lateral ventricles and midline structures are stable. No acute extra-axial fluid collections. No mass effect. Vascular: No hyperdense vessel or unexpected calcification. Skull: Normal. Negative for fracture or focal lesion. Sinuses/Orbits: Minimal polypoid mucosal thickening within the maxillary sinuses. Remaining sinuses are clear. Other: None. IMPRESSION: 1. No acute intracranial process. Electronically Signed   By: Randa Ngo M.D.   On: 05/10/2021 18:15   CT Chest Wo Contrast  Result Date: 05/10/2021 CLINICAL  DATA:  75 year old male with respiratory failure. Shortness of breath since yesterday. EXAM: CT CHEST WITHOUT CONTRAST TECHNIQUE: Multidetector CT imaging of the chest was performed following the standard protocol without IV contrast. COMPARISON:  Portable chest 0601 hours today. CT Abdomen and Pelvis 08/07/2018. FINDINGS: Cardiovascular: Prior CABG. Normal caliber thoracic aorta. Cardiac size is stable since 2019 and within normal limits. No pericardial effusion. Mediastinum/Nodes: Negative.  No mediastinal lymphadenopathy. Lungs/Pleura: Major airways are patent. There is minimal dependent lower lobe opacity bilaterally compatible with atelectasis or scarring. Borderline bilateral lower lobe bronchiectasis also. A tiny left lower lobe subpleural nodule on series 8, image 87 is stable since 2019 and benign. Tiny calcified granuloma in the superior segment of the right lower lobe. And there is stable mild pleural calcification in the medial right costophrenic angle. No pleural effusion or other abnormal pulmonary opacity. Upper Abdomen: Negative visible noncontrast liver, gallbladder, spleen, adrenal glands and  bowel in the upper abdomen. Mild generalized pancreatic atrophy since 2019. Negative visible right renal upper pole. Musculoskeletal: Prior sternotomy. Intermittent advanced thoracic disc degeneration and vacuum disc. No acute osseous abnormality identified. IMPRESSION: 1. No acute pulmonary abnormality. Mild lung base scarring/atelectasis and occasional tiny pulmonary granulomas. 2. Prior CABG. Electronically Signed   By: Genevie Ann M.D.   On: 05/10/2021 09:34   DG Chest Port 1 View  Result Date: 05/10/2021 CLINICAL DATA:  75 year old male with history of increasing shortness of breath. EXAM: PORTABLE CHEST 1 VIEW COMPARISON:  Chest x-ray 04/10/2021. FINDINGS: Lung volumes are normal. No consolidative airspace disease. No pleural effusions. No pneumothorax. No pulmonary nodule or mass noted. Pulmonary vasculature and the cardiomediastinal silhouette are within normal limits. Atherosclerosis in the thoracic aorta. Status post median sternotomy for CABG. IMPRESSION: 1.  No radiographic evidence of acute cardiopulmonary disease. 2. Aortic atherosclerosis. Electronically Signed   By: Vinnie Langton M.D.   On: 05/10/2021 06:34   ECHOCARDIOGRAM COMPLETE  Result Date: 05/10/2021    ECHOCARDIOGRAM REPORT   Patient Name:   KATLIN CISZEWSKI Birenbaum Date of Exam: 05/10/2021 Medical Rec #:  [id number]      Height:       64.0 in Accession #:    [id number]     Weight:       194.2 lb Date of Birth:  February 23, 1946      BSA:          1.932 m Patient Age:    47 years       BP:           165/84 mmHg Patient Gender: M              HR:           87 bpm. Exam Location:  Inpatient Procedure: 2D Echo Indications:    acute diastolic chf  History:        Patient has prior history of Echocardiogram examinations, most                 recent 11/19/2019. CAD, Prior CABG, Signs/Symptoms:Syncope; Risk                 Factors:Hypertension, Dyslipidemia and Sleep Apnea.  Sonographer:    Johny Chess RDCS Referring Phys: 75 [address]   1. Left ventricular ejection fraction, by estimation, is 60 to 65%. The left ventricle has normal function. Left ventricular endocardial border not optimally defined to evaluate regional wall motion. There is mild left ventricular hypertrophy. Left ventricular diastolic parameters are consistent with  Grade I diastolic dysfunction (impaired relaxation).  2. Right ventricular systolic function is mildly reduced. The right ventricular size is mildly enlarged. Tricuspid regurgitation signal is inadequate for assessing PA pressure.  3. Left atrial size was mildly dilated.  4. The mitral valve is degenerative. Trivial mitral valve regurgitation. No evidence of mitral stenosis.  5. The aortic valve is abnormal. There is moderate calcification of the aortic valve. There is moderate thickening of the aortic valve. Aortic valve regurgitation is trivial. Mild aortic valve stenosis. Aortic valve area, by VTI measures 1.81 cm. Aortic valve mean gradient measures 11.7 mmHg. Aortic valve Vmax measures 2.50 m/s.  6. The inferior vena cava is normal in size with greater than 50% respiratory variability, suggesting right atrial pressure of 3 mmHg. FINDINGS  Left Ventricle: Left ventricular ejection fraction, by estimation, is 60 to 65%. The left ventricle has normal function. Left ventricular endocardial border not optimally defined to evaluate regional wall motion. The left ventricular internal cavity size was normal in size. There is mild left ventricular hypertrophy. Abnormal (paradoxical) septal motion consistent with post-operative status. Left ventricular diastolic parameters are consistent with Grade I diastolic dysfunction (impaired relaxation). Right Ventricle: The right ventricular size is mildly enlarged. No increase in right ventricular wall thickness. Right ventricular systolic function is mildly reduced. Tricuspid regurgitation signal is inadequate for assessing PA pressure. Left Atrium: Left atrial size was mildly  dilated. Right Atrium: Right atrial size was not well visualized. Pericardium: There is no evidence of pericardial effusion. Mitral Valve: The mitral valve is degenerative in appearance. Mild to moderate mitral annular calcification. Trivial mitral valve regurgitation. No evidence of mitral valve stenosis. Tricuspid Valve: The tricuspid valve is normal in structure. Tricuspid valve regurgitation is trivial. No evidence of tricuspid stenosis. Aortic Valve: The aortic valve is abnormal. There is moderate calcification of the aortic valve. There is moderate thickening of the aortic valve. Aortic valve regurgitation is trivial. Mild aortic stenosis is present. Aortic valve mean gradient measures  11.7 mmHg. Aortic valve peak gradient measures 25.0 mmHg. Aortic valve area, by VTI measures 1.81 cm. Pulmonic Valve: The pulmonic valve was normal in structure. Pulmonic valve regurgitation is trivial. No evidence of pulmonic stenosis. Aorta: The aortic root is normal in size and structure. Venous: The inferior vena cava is normal in size with greater than 50% respiratory variability, suggesting right atrial pressure of 3 mmHg. IAS/Shunts: No atrial level shunt detected by color flow Doppler.  LEFT VENTRICLE PLAX 2D LVIDd:         4.10 cm  Diastology LVIDs:         2.70 cm  LV e' medial:    6.42 cm/s LV PW:         1.00 cm  LV E/e' medial:  13.4 LV IVS:        1.30 cm  LV e' lateral:   9.46 cm/s LVOT diam:     2.20 cm  LV E/e' lateral: 9.1 LV SV:         72 LV SV Index:   37 LVOT Area:     3.80 cm  RIGHT VENTRICLE            IVC RV S prime:     9.79 cm/s  IVC diam: 1.70 cm LEFT ATRIUM             Index LA diam:        3.90 cm 2.02 cm/m LA Vol (A2C):   43.8 ml 22.67 ml/m LA Vol (  A4C):   66.8 ml 34.57 ml/m LA Biplane Vol: 55.8 ml 28.88 ml/m  AORTIC VALVE AV Area (Vmax):    1.80 cm AV Area (Vmean):   1.96 cm AV Area (VTI):     1.81 cm AV Vmax:           249.76 cm/s AV Vmean:          146.016 cm/s AV VTI:            0.398  m AV Peak Grad:      25.0 mmHg AV Mean Grad:      11.7 mmHg LVOT Vmax:         118.00 cm/s LVOT Vmean:        75.100 cm/s LVOT VTI:          0.189 m LVOT/AV VTI ratio: 0.48  AORTA Ao Root diam: 3.00 cm Ao Asc diam:  3.70 cm MITRAL VALVE MV Area (PHT): 3.72 cm     SHUNTS MV Decel Time: 204 msec     Systemic VTI:  0.19 m MV E velocity: 85.90 cm/s   Systemic Diam: 2.20 cm MV A velocity: 118.00 cm/s MV E/A ratio:  0.73 Cherlynn Kaiser MD Electronically signed by Cherlynn Kaiser MD Signature Date/Time: 05/10/2021/5:39:18 PM    Final      LOS: 0 days   Oren Binet, MD  [facility]    To contact the attending provider between 7A-7P or the covering provider during after hours 7P-7A, please log into the web site [email or web address] and access using universal [place] password for that web site. If you do not have the password, please call the hospital operator.  05/11/2021, 2:00 PM

## 2021-05-11 NOTE — Progress Notes (Signed)
Progress Note  Patient Name: Fernando Hess Date of Encounter: 05/11/2021  Primary Cardiologist: Werner Lean, MD   Subjective   Overnight notes improvement with breathing with lasix.  This AM has positive orthostatic and symptomatic vitals.  No further syncope or near syncope.  Inpatient Medications    Scheduled Meds:  apixaban  5 mg Oral BID   atorvastatin  40 mg Oral QPM   DULoxetine  90 mg Oral Daily   insulin aspart  0-9 Units Subcutaneous TID WC   insulin aspart  4 Units Subcutaneous Q lunch   insulin glargine-yfgn  14 Units Subcutaneous QHS   insulin glargine-yfgn  34 Units Subcutaneous Daily   pantoprazole  40 mg Oral Daily   pioglitazone  45 mg Oral q morning   sodium chloride flush  3 mL Intravenous Q12H   Continuous Infusions:  PRN Meds: acetaminophen, acetaminophen-codeine, hydrALAZINE, hydrOXYzine, melatonin, ondansetron   Vital Signs    Vitals:   05/11/21 0200 05/11/21 0254 05/11/21 0600 05/11/21 0824  BP: (!) 148/76 (!) 157/116  134/76  Pulse: 64 71  74  Resp: (!) 24 20  18   Temp: 98.1 F (36.7 C) 98.3 F (36.8 C)  98.5 F (36.9 C)  TempSrc: Oral Oral  Oral  SpO2: 96% 96%  95%  Height:   5\' 6"  (1.676 m)    No intake or output data in the 24 hours ending 05/11/21 1049 There were no vitals filed for this visit.  Telemetry    SR- alarms not VT and AF:  he has low voltage p waves and has intermittent RBBB; no significant pauses - Personally Reviewed  ECG    Sinus bradycardia rate 58 with intermittent RBBB - Personally Reviewed  Physical Exam   GEN: No acute distress.   Neck: No JVD Cardiac: RRR, systolic crescendo no rubs, or gallops.  Respiratory: Clear to auscultation bilaterally. GI: Soft, nontender, non-distended  MS: No edema; No deformity. Neuro:  Nonfocal  Psych: Normal affect   Labs    Chemistry Recent Labs  Lab 05/10/21 0545 05/10/21 0606 05/11/21 0616  NA 135 137 139  K 4.4 4.2 4.8  CL 104  --  103  CO2 24   --  31  GLUCOSE 360*  --  205*  BUN 29*  --  26*  CREATININE 2.04*  --  1.91*  CALCIUM 8.4*  --  8.9  PROT 5.8*  --   --   ALBUMIN 3.2*  --   --   AST 28  --   --   ALT 21  --   --   ALKPHOS 110  --   --   BILITOT 1.1  --   --   GFRNONAA 33*  --  36*  ANIONGAP 7  --  5     Hematology Recent Labs  Lab 05/10/21 0606  HGB 14.6  HCT 43.0    Cardiac EnzymesNo results for input(s): TROPONINI in the last 168 hours. No results for input(s): TROPIPOC in the last 168 hours.   BNP Recent Labs  Lab 05/10/21 0545  BNP 213.1*     DDimer  Recent Labs  Lab 05/10/21 0545  DDIMER <0.27     Radiology    CT HEAD WO CONTRAST (5MM)  Result Date: 05/10/2021 CLINICAL DATA:  Intermittent shortness of breath, heart palpitations, head trauma EXAM: CT HEAD WITHOUT CONTRAST TECHNIQUE: Contiguous axial images were obtained from the base of the skull through the vertex without intravenous contrast. COMPARISON:  10/13/2018 FINDINGS: Brain: No acute infarct or hemorrhage. Lateral ventricles and midline structures are stable. No acute extra-axial fluid collections. No mass effect. Vascular: No hyperdense vessel or unexpected calcification. Skull: Normal. Negative for fracture or focal lesion. Sinuses/Orbits: Minimal polypoid mucosal thickening within the maxillary sinuses. Remaining sinuses are clear. Other: None. IMPRESSION: 1. No acute intracranial process. Electronically Signed   By: Randa Ngo M.D.   On: 05/10/2021 18:15   CT Chest Wo Contrast  Result Date: 05/10/2021 CLINICAL DATA:  75 year old male with respiratory failure. Shortness of breath since yesterday. EXAM: CT CHEST WITHOUT CONTRAST TECHNIQUE: Multidetector CT imaging of the chest was performed following the standard protocol without IV contrast. COMPARISON:  Portable chest 0601 hours today. CT Abdomen and Pelvis 08/07/2018. FINDINGS: Cardiovascular: Prior CABG. Normal caliber thoracic aorta. Cardiac size is stable since 2019 and  within normal limits. No pericardial effusion. Mediastinum/Nodes: Negative.  No mediastinal lymphadenopathy. Lungs/Pleura: Major airways are patent. There is minimal dependent lower lobe opacity bilaterally compatible with atelectasis or scarring. Borderline bilateral lower lobe bronchiectasis also. A tiny left lower lobe subpleural nodule on series 8, image 87 is stable since 2019 and benign. Tiny calcified granuloma in the superior segment of the right lower lobe. And there is stable mild pleural calcification in the medial right costophrenic angle. No pleural effusion or other abnormal pulmonary opacity. Upper Abdomen: Negative visible noncontrast liver, gallbladder, spleen, adrenal glands and bowel in the upper abdomen. Mild generalized pancreatic atrophy since 2019. Negative visible right renal upper pole. Musculoskeletal: Prior sternotomy. Intermittent advanced thoracic disc degeneration and vacuum disc. No acute osseous abnormality identified. IMPRESSION: 1. No acute pulmonary abnormality. Mild lung base scarring/atelectasis and occasional tiny pulmonary granulomas. 2. Prior CABG. Electronically Signed   By: Genevie Ann M.D.   On: 05/10/2021 09:34   DG Chest Port 1 View  Result Date: 05/10/2021 CLINICAL DATA:  75 year old male with history of increasing shortness of breath. EXAM: PORTABLE CHEST 1 VIEW COMPARISON:  Chest x-ray 04/10/2021. FINDINGS: Lung volumes are normal. No consolidative airspace disease. No pleural effusions. No pneumothorax. No pulmonary nodule or mass noted. Pulmonary vasculature and the cardiomediastinal silhouette are within normal limits. Atherosclerosis in the thoracic aorta. Status post median sternotomy for CABG. IMPRESSION: 1.  No radiographic evidence of acute cardiopulmonary disease. 2. Aortic atherosclerosis. Electronically Signed   By: Vinnie Langton M.D.   On: 05/10/2021 06:34   ECHOCARDIOGRAM COMPLETE  Result Date: 05/10/2021    ECHOCARDIOGRAM REPORT   Patient Name:    Fernando Hess Date of Exam: 05/10/2021 Medical Rec #:  [id number]      Height:       64.0 in Accession #:    [id number]     Weight:       194.2 lb Date of Birth:  10-31-1945      BSA:          1.932 m Patient Age:    75 years       BP:           165/84 mmHg Patient Gender: M              HR:           87 bpm. Exam Location:  Inpatient Procedure: 2D Echo Indications:    acute diastolic chf  History:        Patient has prior history of Echocardiogram examinations, most                 recent 11/19/2019.  CAD, Prior CABG, Signs/Symptoms:Syncope; Risk                 Factors:Hypertension, Dyslipidemia and Sleep Apnea.  Sonographer:    Johny Chess RDCS Referring Phys: 47 [place]  1. Left ventricular ejection fraction, by estimation, is 60 to 65%. The left ventricle has normal function. Left ventricular endocardial border not optimally defined to evaluate regional wall motion. There is mild left ventricular hypertrophy. Left ventricular diastolic parameters are consistent with Grade I diastolic dysfunction (impaired relaxation).  2. Right ventricular systolic function is mildly reduced. The right ventricular size is mildly enlarged. Tricuspid regurgitation signal is inadequate for assessing PA pressure.  3. Left atrial size was mildly dilated.  4. The mitral valve is degenerative. Trivial mitral valve regurgitation. No evidence of mitral stenosis.  5. The aortic valve is abnormal. There is moderate calcification of the aortic valve. There is moderate thickening of the aortic valve. Aortic valve regurgitation is trivial. Mild aortic valve stenosis. Aortic valve area, by VTI measures 1.81 cm. Aortic valve mean gradient measures 11.7 mmHg. Aortic valve Vmax measures 2.50 m/s.  6. The inferior vena cava is normal in size with greater than 50% respiratory variability, suggesting right atrial pressure of 3 mmHg. FINDINGS  Left Ventricle: Left ventricular ejection fraction, by estimation, is 60 to 65%.  The left ventricle has normal function. Left ventricular endocardial border not optimally defined to evaluate regional wall motion. The left ventricular internal cavity size was normal in size. There is mild left ventricular hypertrophy. Abnormal (paradoxical) septal motion consistent with post-operative status. Left ventricular diastolic parameters are consistent with Grade I diastolic dysfunction (impaired relaxation). Right Ventricle: The right ventricular size is mildly enlarged. No increase in right ventricular wall thickness. Right ventricular systolic function is mildly reduced. Tricuspid regurgitation signal is inadequate for assessing PA pressure. Left Atrium: Left atrial size was mildly dilated. Right Atrium: Right atrial size was not well visualized. Pericardium: There is no evidence of pericardial effusion. Mitral Valve: The mitral valve is degenerative in appearance. Mild to moderate mitral annular calcification. Trivial mitral valve regurgitation. No evidence of mitral valve stenosis. Tricuspid Valve: The tricuspid valve is normal in structure. Tricuspid valve regurgitation is trivial. No evidence of tricuspid stenosis. Aortic Valve: The aortic valve is abnormal. There is moderate calcification of the aortic valve. There is moderate thickening of the aortic valve. Aortic valve regurgitation is trivial. Mild aortic stenosis is present. Aortic valve mean gradient measures  11.7 mmHg. Aortic valve peak gradient measures 25.0 mmHg. Aortic valve area, by VTI measures 1.81 cm. Pulmonic Valve: The pulmonic valve was normal in structure. Pulmonic valve regurgitation is trivial. No evidence of pulmonic stenosis. Aorta: The aortic root is normal in size and structure. Venous: The inferior vena cava is normal in size with greater than 50% respiratory variability, suggesting right atrial pressure of 3 mmHg. IAS/Shunts: No atrial level shunt detected by color flow Doppler.  LEFT VENTRICLE PLAX 2D LVIDd:          4.10 cm  Diastology LVIDs:         2.70 cm  LV e' medial:    6.42 cm/s LV PW:         1.00 cm  LV E/e' medial:  13.4 LV IVS:        1.30 cm  LV e' lateral:   9.46 cm/s LVOT diam:     2.20 cm  LV E/e' lateral: 9.1 LV SV:  72 LV SV Index:   37 LVOT Area:     3.80 cm  RIGHT VENTRICLE            IVC RV S prime:     9.79 cm/s  IVC diam: 1.70 cm LEFT ATRIUM             Index LA diam:        3.90 cm 2.02 cm/m LA Vol (A2C):   43.8 ml 22.67 ml/m LA Vol (A4C):   66.8 ml 34.57 ml/m LA Biplane Vol: 55.8 ml 28.88 ml/m  AORTIC VALVE AV Area (Vmax):    1.80 cm AV Area (Vmean):   1.96 cm AV Area (VTI):     1.81 cm AV Vmax:           249.76 cm/s AV Vmean:          146.016 cm/s AV VTI:            0.398 m AV Peak Grad:      25.0 mmHg AV Mean Grad:      11.7 mmHg LVOT Vmax:         118.00 cm/s LVOT Vmean:        75.100 cm/s LVOT VTI:          0.189 m LVOT/AV VTI ratio: 0.48  AORTA Ao Root diam: 3.00 cm Ao Asc diam:  3.70 cm MITRAL VALVE MV Area (PHT): 3.72 cm     SHUNTS MV Decel Time: 204 msec     Systemic VTI:  0.19 m MV E velocity: 85.90 cm/s   Systemic Diam: 2.20 cm MV A velocity: 118.00 cm/s MV E/A ratio:  0.73 Cherlynn Kaiser MD Electronically signed by Cherlynn Kaiser MD Signature Date/Time: 05/10/2021/5:39:18 PM    Final     Cardiac Studies   Cardiac Event Monitoring: Date: 05/09/20 Results: Sinus bradycardia with minimum rate 48 BPM, pauses up to 3 seconds even during awake hours. Frequent epsiodes of atrial fibrillation with RVR (2% of the monitoring time).   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    Transthoracic Echocardiogram: Date:05/10/21 Results:Mild AS with normal LVSVI  1. Left ventricular ejection fraction, by estimation, is 60 to 65%. The  left ventricle has normal function. Left ventricular endocardial border  not optimally defined to evaluate regional wall motion. There is mild left  ventricular hypertrophy. Left   ventricular diastolic parameters are consistent with Grade I diastolic  dysfunction (impaired relaxation).   2. Right ventricular systolic function is mildly reduced. The right  ventricular size is mildly enlarged. Tricuspid regurgitation signal is  inadequate for assessing PA pressure.   3. Left atrial size was mildly dilated.   4. The mitral valve is degenerative. Trivial mitral valve regurgitation.  No evidence of mitral stenosis.   5. The aortic valve is abnormal. There is moderate calcification of the  aortic valve. There is moderate thickening of the aortic valve. Aortic  valve regurgitation is trivial. Mild aortic valve stenosis. Aortic valve  area, by VTI measures 1.81 cm.  Aortic valve mean gradient measures 11.7 mmHg. Aortic valve Vmax measures  2.50 m/s.   6. The inferior vena cava is normal in size with greater than 50%  respiratory variability, suggesting right atrial pressure of 3 mmHg.   ECG or NM Stress Testing : Date: 03/20/20 Results: The left ventricular ejection fraction is normal (55-65%). Nuclear stress EF: 65%. There was no ST segment deviation noted during stress. No  T wave inversion was noted during stress. This is a low risk study. No significant change since prior study.   Small defect of mild severity at apex, similar to prior. Given normal wall motion, suspect apical thinning artifact. No ischemia.  Left/Right Heart Catheterizations: Date: 10/22/2015 Results: Ost LM to LM lesion, 50% stenosed. Mid LAD lesion, 90% stenosed. The LIMA to LAD graft is widely patent Mid RCA lesion, 50% stenosed. SVG . The saphenous vein graft to right PDA is widely patent without stenosis SVG . The saphenous vein graft to first diagonal is widely patent. This graft retrograde fills the LAD territory, with relatively brisk flow into the proximal septal perforators. Ost Ramus to Ramus lesion, 90% stenosed. Ost 1st Mrg to 1st Mrg lesion, 80% stenosed. Prox Cx lesion, 80%  stenosed. 2nd Mrg lesion, 100% stenosed. SVG . This graft is occluded just beyond the aortic anastomosis This graft is occluded in the proximal body Origin to Prox Graft lesion, 100% stenosed. Prox Graft lesion, 100% stenosed.   1. 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 S/P recent CABG with patency of the LIMA-LAD, SVG-PDA, and SVG-diagonal 3. Total occlusion of the SVG-OM1 and SVG-OM2 4. Total occlusion of the distal OM2, suspected culprit for STEMI   Recommend: medical therapy, add clopidogrel, resume heparin in 8 hrs. With diffuse diabetic CAD, may be best to manage medically rather than proceed with complex PCI of the left circumflex branches. I felt best not to acutely intervene on the distal OM2 as the vessel terminates not far beyond the area of occlusion based on preop coronary angiogram review.   Patient Profile     75 y.o. 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  Seen for two days of shortness of breath.  Assessment & Plan    DOE CKD stage IIIb Mild AS - BNP mildly elevated - improved with IV lasix; one further day of IV lasix (40 X 1); likely will transition to either 20 mg PO three times a week or 20 mg PO daily as maintenance   Syncope, PAF Orthostatic hypotension - d/c BB with no further syncope - will get DC heart monitor at DC and need outpatient EP f/u  CAD: - anginal equivalent in the past was neck pain - rx medically, no recent c/o chest pain - had CP in 2017 - BB held for pauses; lipitor 40 mg qd - no ASA due to Eliquis - MV in 2021 was OK - concern that SOB is anginal equivalent, although he had CP in the past when he had MI   DM - hypergylcemia no hypoglycemia - IM   Potential 05/12/21 if IM is in agreement  For questions or updates,  please contact [place] Please consult [email or web address] for contact info under Cardiology/STEMI.      Signed, Werner Lean, MD  05/11/2021, 10:49 AM

## 2021-05-12 ENCOUNTER — Encounter (HOSPITAL_COMMUNITY): Payer: Self-pay | Admitting: Internal Medicine

## 2021-05-12 ENCOUNTER — Other Ambulatory Visit: Payer: Self-pay | Admitting: Physician Assistant

## 2021-05-12 DIAGNOSIS — R55 Syncope and collapse: Secondary | ICD-10-CM

## 2021-05-12 LAB — GLUCOSE, CAPILLARY
Glucose-Capillary: 94 mg/dL (ref 70–99)
Glucose-Capillary: 112 mg/dL — ABNORMAL HIGH (ref 70–99)
Glucose-Capillary: 195 mg/dL — ABNORMAL HIGH (ref 70–99)
Glucose-Capillary: 122 mg/dL — ABNORMAL HIGH (ref 70–99)

## 2021-05-12 NOTE — Progress Notes (Signed)
   05/12/21 0700  Orthostatic Lying   BP- Lying 150/75  Pulse- Lying 80  Orthostatic Sitting  BP- Sitting 135/62  Pulse- Sitting 93  Orthostatic Standing at 0 minutes  BP- Standing at 0 minutes 111/49  Pulse- Standing at 0 minutes 103  Orthostatic Standing at 3 minutes  BP- Standing at 3 minutes 91/65  Pulse- Standing at 3 minutes 104   Pt reported no symptoms of lightheadedness or dizziness. Patient is wearing thigh-high TED hose at this time.

## 2021-05-12 NOTE — Progress Notes (Signed)
Pt discharged from 5W26. AVS reviewed and all questions answered. IV d/c'd. VS stable. Patient has all belongings including clothes, shoes, glasses, cell phone, and cell phone charger. Pt will be taken to wife's car via wheelchair by NT.

## 2021-05-12 NOTE — Progress Notes (Addendum)
Patient awaiting discharge since 2pm. Unable to reach wife on the phone, going straight to voicemail. Will continue to try and reach patient's wife so that the patient can be discharged home.

## 2021-05-12 NOTE — Progress Notes (Signed)
  Orthostatic BP as follows: Patient was wearing teds while Bps were taken.     83/60 Abnormal   by Tobe Sos, RN at 05/12/21 0244  143/91 Abnormal   by Tobe Sos, RN at 05/12/21 0241  146/87 Abnormal   by Essie Hart, NT at 05/12/21 [phone]    Patient educated. Reminded to call staff when getting up. Telesitter in place.

## 2021-05-12 NOTE — TOC Transition Note (Signed)
Transition of Care [facility]) - CM/SW Discharge Note   Patient Details  Name: Fernando Hess MRN: [id number] Date of Birth: 1946-02-14  [facility] CM/SW Contact:  Carles Collet, RN Phone Number: 05/12/2021, 3:52 PM   Clinical Narrative:    Met with patient at bedside to discuss disposition resources and needs. He states that has DME at home RW, rollator, canes, crutches.  He is agreeable to [facility] services. Referral for [facility] placed to  [facility]- declined [facility]- declined [facility]- declined [facility]- Accepted    Final next level of care: [place] Barriers to Discharge: No Barriers Identified   Patient Goals and CMS Choice Patient states their goals for this hospitalization and ongoing recovery are:: to go home CMS Medicare.gov Compare Post Acute Care list provided to:: Patient Choice offered to / list presented to : Patient  Discharge Placement                       Discharge Plan and Services                DME Arranged: N/A         HH Arranged: PT, OT          Social Determinants of Health (SDOH) Interventions     Readmission Risk Interventions No flowsheet data found.

## 2021-05-12 NOTE — Progress Notes (Addendum)
Pt cont attempts to get up without calling despite frequent gentle reminder to use call light. Orthostatic blood pressure remains significant despite ted hose use (see BP readings, orthostatic BP).  Telesitter in place (report given); patient educated.

## 2021-05-12 NOTE — Discharge Summary (Signed)
[facility]  Physician Discharge Summary   Patient ID: Fernando Hess MRN: [id number] DOB/AGE: [birth year] 77 y.o.  Admit date: 05/10/2021 Discharge date: 05/12/2021    PCP: [name], MD  DISCHARGE DIAGNOSES:  Syncope likely due to orthostatic hypotension Orthostatic hypotension Acute on chronic diastolic CHF Paroxysmal atrial fibrillation Diabetes mellitus type 2, uncontrolled with hyperglycemia Chronic kidney disease stage IIIb Hyperlipidemia   RECOMMENDATIONS FOR OUTPATIENT FOLLOW UP: Cardiology to arrange outpatient follow-up and 30-day event monitor   Home Health: PT and OT Equipment/Devices: None  CODE STATUS: Full code  DISCHARGE CONDITION: fair  Diet recommendation: As before  INITIAL HISTORY: Patient is a 75 y.o. male with history of CAD s/p CABG 2017, PAF on Eliquis, HTN, DM-2, CKD stage IIIb, OSA on CPAP-who presented for evaluation of syncopal episodes and shortness of breath.  See below for further details.   Significant events: 8/26>> admit for evaluation of syncope and exertional dyspnea.   Significant studies: 8/26>> CT head: No acute intracranial abnormality. 8/26>> CXR: No pneumonia 8/26>> Echo: EF 57-32%, grade 1 diastolic dysfunction, mild aortic stenosis  Consultations: Cardiology   HOSPITAL COURSE:   Acute on chronic diastolic CHF: Underwent transthoracic echocardiogram which showed normal systolic function.  Patient seen by cardiology.  Was given IV diuretics.  Symptoms improved.  As needed furosemide to be used at home.  Cardiology to arrange outpatient follow-up.     Syncope: Likely due to orthostatic mechanism (not sure if this is from diuretics/antihypertensives or from autonomic dysfunction due to diabetes).  No longer on beta-blocker (also had bradycardia).  Echo with preserved EF-telemetry without any major arrhythmias.  Cardiology plans outpatient heart monitoring.  Continue TED stockings.  Patient instructed on how  to get up slowly from a sitting or lying position.  Paroxysmal atrial fibrillation: No longer on beta-blocker-on Eliquis.   DM-2 (A1c 11.5 on 8/27): Continue home medication regimen.   Hyperlipidemia: Continue statin   CKD stage IIIb: Creatinine close to baseline.   Obesity: Estimated body mass index is 77.34 kg/m as calculated from the following:   Height as of this encounter: 5\' 6"  (1.676 m).   Weight as of 12/04/20: 88.1 kg.    Patient remains stable.  Okay for discharge home.  Seen by physical therapy.  Home health has been ordered.   PERTINENT LABS:  The results of significant diagnostics from this hospitalization (including imaging, microbiology, ancillary and laboratory) are listed below for reference.    Microbiology: Recent Results (from the past 240 hour(s))  Resp Panel by RT-PCR (Flu A&B, Covid) Nasopharyngeal Swab     Status: None   Collection Time: 05/10/21  8:13 AM   Specimen: Nasopharyngeal Swab; Nasopharyngeal(NP) swabs in vial transport medium  Result Value Ref Range Status   SARS Coronavirus 2 by RT PCR NEGATIVE NEGATIVE Final    Comment: (NOTE) SARS-CoV-2 target nucleic acids are NOT DETECTED.  The SARS-CoV-2 RNA is generally detectable in upper respiratory specimens during the acute phase of infection. The lowest concentration of SARS-CoV-2 viral copies this assay can detect is 138 copies/mL. A negative result does not preclude SARS-Cov-2 infection and should not be used as the sole basis for treatment or other patient management decisions. A negative result may occur with  improper specimen collection/handling, submission of specimen other than nasopharyngeal swab, presence of viral mutation(s) within the areas targeted by this assay, and inadequate number of viral copies(<138 copies/mL). A negative result must be combined with clinical observations, patient history, and epidemiological information. The expected  result is Negative.  Fact Sheet for  Patients:  EntrepreneurPulse.com.au  Fact Sheet for Healthcare Providers:  IncredibleEmployment.be  This test is no t yet approved or cleared by the [place] FDA and  has been authorized for detection and/or diagnosis of SARS-CoV-2 by FDA under an Emergency Use Authorization (EUA). This EUA will remain  in effect (meaning this test can be used) for the duration of the COVID-19 declaration under Section 564(b)(1) of the Act, 21 U.S.C.section 360bbb-3(b)(1), unless the authorization is terminated  or revoked sooner.       Influenza A by PCR NEGATIVE NEGATIVE Final   Influenza B by PCR NEGATIVE NEGATIVE Final    Comment: (NOTE) The Xpert Xpress SARS-CoV-2/FLU/RSV plus assay is intended as an aid in the diagnosis of influenza from Nasopharyngeal swab specimens and should not be used as a sole basis for treatment. Nasal washings and aspirates are unacceptable for Xpert Xpress SARS-CoV-2/FLU/RSV testing.  Fact Sheet for Patients: EntrepreneurPulse.com.au  Fact Sheet for Healthcare Providers: IncredibleEmployment.be  This test is not yet approved or cleared by the [place] FDA and has been authorized for detection and/or diagnosis of SARS-CoV-2 by FDA under an Emergency Use Authorization (EUA). This EUA will remain in effect (meaning this test can be used) for the duration of the COVID-19 declaration under Section 564(b)(1) of the Act, 21 U.S.C. section 360bbb-3(b)(1), unless the authorization is terminated or revoked.  Performed at [facility], [place] [address] [place], [place] [address]      Labs:  COVID-19 Labs   Lab Results  Component Value Date   SARSCOV2NAA NEGATIVE 05/10/2021   [place] NEGATIVE 06/25/2020   [place] NEGATIVE 11/18/2019      Basic Metabolic Panel: Recent Labs  Lab 05/10/21 0545 05/10/21 0606 05/11/21 0616  NA 135 137 139  K 4.4 4.2 4.8  CL 104   --  103  CO2 24  --  31  GLUCOSE 360*  --  205*  BUN 29*  --  26*  CREATININE 2.04*  --  1.91*  CALCIUM 8.4*  --  8.9  MG 1.9  --   --    Liver Function Tests: Recent Labs  Lab 05/10/21 0545  AST 28  ALT 21  ALKPHOS 110  BILITOT 1.1  PROT 5.8*  ALBUMIN 3.2*    CBC: Recent Labs  Lab 05/10/21 0606  HGB 14.6  HCT 43.0    BNP: BNP (last 3 results) Recent Labs    06/25/20 1634 05/10/21 0545  BNP 161.7* 213.1*     CBG: Recent Labs  Lab 05/11/21 2052 05/12/21 0637 05/12/21 0744 05/12/21 1208 05/12/21 1758  GLUCAP 139* 94 112* 195* 122*     IMAGING STUDIES CT HEAD WO CONTRAST (5MM)  Result Date: 05/10/2021 CLINICAL DATA:  Intermittent shortness of breath, heart palpitations, head trauma EXAM: CT HEAD WITHOUT CONTRAST TECHNIQUE: Contiguous axial images were obtained from the base of the skull through the vertex without intravenous contrast. COMPARISON:  10/13/2018 FINDINGS: Brain: No acute infarct or hemorrhage. Lateral ventricles and midline structures are stable. No acute extra-axial fluid collections. No mass effect. Vascular: No hyperdense vessel or unexpected calcification. Skull: Normal. Negative for fracture or focal lesion. Sinuses/Orbits: Minimal polypoid mucosal thickening within the maxillary sinuses. Remaining sinuses are clear. Other: None. IMPRESSION: 1. No acute intracranial process. Electronically Signed   By: Randa Ngo M.D.   On: 05/10/2021 18:15   CT Chest Wo Contrast  Result Date: 05/10/2021 CLINICAL DATA:  75 year old male with respiratory  failure. Shortness of breath since yesterday. EXAM: CT CHEST WITHOUT CONTRAST TECHNIQUE: Multidetector CT imaging of the chest was performed following the standard protocol without IV contrast. COMPARISON:  Portable chest 0601 hours today. CT Abdomen and Pelvis 08/07/2018. FINDINGS: Cardiovascular: Prior CABG. Normal caliber thoracic aorta. Cardiac size is stable since 2019 and within normal limits. No  pericardial effusion. Mediastinum/Nodes: Negative.  No mediastinal lymphadenopathy. Lungs/Pleura: Major airways are patent. There is minimal dependent lower lobe opacity bilaterally compatible with atelectasis or scarring. Borderline bilateral lower lobe bronchiectasis also. A tiny left lower lobe subpleural nodule on series 8, image 87 is stable since 2019 and benign. Tiny calcified granuloma in the superior segment of the right lower lobe. And there is stable mild pleural calcification in the medial right costophrenic angle. No pleural effusion or other abnormal pulmonary opacity. Upper Abdomen: Negative visible noncontrast liver, gallbladder, spleen, adrenal glands and bowel in the upper abdomen. Mild generalized pancreatic atrophy since 2019. Negative visible right renal upper pole. Musculoskeletal: Prior sternotomy. Intermittent advanced thoracic disc degeneration and vacuum disc. No acute osseous abnormality identified. IMPRESSION: 1. No acute pulmonary abnormality. Mild lung base scarring/atelectasis and occasional tiny pulmonary granulomas. 2. Prior CABG. Electronically Signed   By: Genevie Ann M.D.   On: 05/10/2021 09:34   DG Chest Port 1 View  Result Date: 05/10/2021 CLINICAL DATA:  75 year old male with history of increasing shortness of breath. EXAM: PORTABLE CHEST 1 VIEW COMPARISON:  Chest x-ray 04/10/2021. FINDINGS: Lung volumes are normal. No consolidative airspace disease. No pleural effusions. No pneumothorax. No pulmonary nodule or mass noted. Pulmonary vasculature and the cardiomediastinal silhouette are within normal limits. Atherosclerosis in the thoracic aorta. Status post median sternotomy for CABG. IMPRESSION: 1.  No radiographic evidence of acute cardiopulmonary disease. 2. Aortic atherosclerosis. Electronically Signed   By: Vinnie Langton M.D.   On: 05/10/2021 06:34   ECHOCARDIOGRAM COMPLETE  Result Date: 05/10/2021    ECHOCARDIOGRAM REPORT   Patient Name:   Fernando Hess Heal Date of  Exam: 05/10/2021 Medical Rec #:  [id number]      Height:       64.0 in Accession #:    [id number]     Weight:       194.2 lb Date of Birth:  November 30, 1945      BSA:          1.932 m Patient Age:    45 years       BP:           165/84 mmHg Patient Gender: M              HR:           87 bpm. Exam Location:  Inpatient Procedure: 2D Echo Indications:    acute diastolic chf  History:        Patient has prior history of Echocardiogram examinations, most                 recent 11/19/2019. CAD, Prior CABG, Signs/Symptoms:Syncope; Risk                 Factors:Hypertension, Dyslipidemia and Sleep Apnea.  Sonographer:    Johny Chess RDCS Referring Phys: [id number] [place]  1. Left ventricular ejection fraction, by estimation, is 60 to 65%. The left ventricle has normal function. Left ventricular endocardial border not optimally defined to evaluate regional wall motion. There is mild left ventricular hypertrophy. Left ventricular diastolic parameters are consistent with Grade I diastolic dysfunction (impaired relaxation).  2. Right ventricular systolic function is mildly reduced. The right ventricular size is mildly enlarged. Tricuspid regurgitation signal is inadequate for assessing PA pressure.  3. Left atrial size was mildly dilated.  4. The mitral valve is degenerative. Trivial mitral valve regurgitation. No evidence of mitral stenosis.  5. The aortic valve is abnormal. There is moderate calcification of the aortic valve. There is moderate thickening of the aortic valve. Aortic valve regurgitation is trivial. Mild aortic valve stenosis. Aortic valve area, by VTI measures 1.81 cm. Aortic valve mean gradient measures 11.7 mmHg. Aortic valve Vmax measures 2.50 m/s.  6. The inferior vena cava is normal in size with greater than 50% respiratory variability, suggesting right atrial pressure of 3 mmHg. FINDINGS  Left Ventricle: Left ventricular ejection fraction, by estimation, is 60 to 65%. The left ventricle has  normal function. Left ventricular endocardial border not optimally defined to evaluate regional wall motion. The left ventricular internal cavity size was normal in size. There is mild left ventricular hypertrophy. Abnormal (paradoxical) septal motion consistent with post-operative status. Left ventricular diastolic parameters are consistent with Grade I diastolic dysfunction (impaired relaxation). Right Ventricle: The right ventricular size is mildly enlarged. No increase in right ventricular wall thickness. Right ventricular systolic function is mildly reduced. Tricuspid regurgitation signal is inadequate for assessing PA pressure. Left Atrium: Left atrial size was mildly dilated. Right Atrium: Right atrial size was not well visualized. Pericardium: There is no evidence of pericardial effusion. Mitral Valve: The mitral valve is degenerative in appearance. Mild to moderate mitral annular calcification. Trivial mitral valve regurgitation. No evidence of mitral valve stenosis. Tricuspid Valve: The tricuspid valve is normal in structure. Tricuspid valve regurgitation is trivial. No evidence of tricuspid stenosis. Aortic Valve: The aortic valve is abnormal. There is moderate calcification of the aortic valve. There is moderate thickening of the aortic valve. Aortic valve regurgitation is trivial. Mild aortic stenosis is present. Aortic valve mean gradient measures  11.7 mmHg. Aortic valve peak gradient measures 25.0 mmHg. Aortic valve area, by VTI measures 1.81 cm. Pulmonic Valve: The pulmonic valve was normal in structure. Pulmonic valve regurgitation is trivial. No evidence of pulmonic stenosis. Aorta: The aortic root is normal in size and structure. Venous: The inferior vena cava is normal in size with greater than 50% respiratory variability, suggesting right atrial pressure of 3 mmHg. IAS/Shunts: No atrial level shunt detected by color flow Doppler.  LEFT VENTRICLE PLAX 2D LVIDd:         4.10 cm  Diastology  LVIDs:         2.70 cm  LV e' medial:    6.42 cm/s LV PW:         1.00 cm  LV E/e' medial:  13.4 LV IVS:        1.30 cm  LV e' lateral:   9.46 cm/s LVOT diam:     2.20 cm  LV E/e' lateral: 9.1 LV SV:         72 LV SV Index:   37 LVOT Area:     3.80 cm  RIGHT VENTRICLE            IVC RV S prime:     9.79 cm/s  IVC diam: 1.70 cm LEFT ATRIUM             Index LA diam:        3.90 cm 2.02 cm/m LA Vol (A2C):   43.8 ml 22.67 ml/m LA Vol (A4C):   66.8 ml 34.57  ml/m LA Biplane Vol: 55.8 ml 28.88 ml/m  AORTIC VALVE AV Area (Vmax):    1.80 cm AV Area (Vmean):   1.96 cm AV Area (VTI):     1.81 cm AV Vmax:           249.76 cm/s AV Vmean:          146.016 cm/s AV VTI:            0.398 m AV Peak Grad:      25.0 mmHg AV Mean Grad:      11.7 mmHg LVOT Vmax:         118.00 cm/s LVOT Vmean:        75.100 cm/s LVOT VTI:          0.189 m LVOT/AV VTI ratio: 0.48  AORTA Ao Root diam: 3.00 cm Ao Asc diam:  3.70 cm MITRAL VALVE MV Area (PHT): 3.72 cm     SHUNTS MV Decel Time: 204 msec     Systemic VTI:  0.19 m MV E velocity: 85.90 cm/s   Systemic Diam: 2.20 cm MV A velocity: 118.00 cm/s MV E/A ratio:  0.73 Cherlynn Kaiser MD Electronically signed by Cherlynn Kaiser MD Signature Date/Time: 05/10/2021/5:39:18 PM    Final    Intravitreal Injection, Pharmacologic Agent - OD - Right Eye  Result Date: 05/01/2021 Time Out 05/01/2021. 8:25 AM. Confirmed correct patient, procedure, site, and patient consented. Anesthesia Topical anesthesia was used. Anesthetic medications included Akten 3.5%. Procedure Preparation included 5% betadine to ocular surface, 10% betadine to eyelids, Tobramycin 0.3%. A 30 gauge needle was used. Injection: 2.5 mg bevacizumab 2.5 MG/0.1ML   Route: Intravitreal, Site: Right Eye   NDC: [phone], Lot: [id number] Post-op Post injection exam found visual acuity of at least counting fingers. The patient tolerated the procedure well. There were no complications. The patient received written and verbal post procedure  care education. Post injection medications were not given.   OCT, Retina - OU - Both Eyes  Result Date: 05/01/2021 Right Eye Quality was good. Scan locations included subfoveal. Central Foveal Thickness: 267. Progression has worsened. Findings include abnormal foveal contour, cystoid macular edema. Left Eye Quality was good. Scan locations included subfoveal. Central Foveal Thickness: 252. Progression has been stable. Findings include abnormal foveal contour. 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  5.5 months post focal treatment.  Repeat injection again OD today and examination next in 8 weeks and study residual CSME and retinopathy with fluorescein angiography next   DISCHARGE EXAMINATION: Vitals:   05/12/21 0622 05/12/21 0748 05/12/21 1208 05/12/21 1524  BP:  134/83 (!) 148/76 (!) 141/81  Pulse: 71 83 76 81  Resp: (!) 23 17 20 18   Temp:  97.7 F (36.5 C) 98.2 F (36.8 C) 98 F (36.7 C)  TempSrc:  Axillary Oral Oral  SpO2: 93% 92% 93%   Weight: 88 kg     Height:       General appearance: Awake alert.  In no distress Resp: Clear to auscultation bilaterally.  Normal effort Cardio: S1-S2 is normal regular.  No S3-S4.  No rubs murmurs or bruit GI: Abdomen is soft.  Nontender nondistended.  Bowel sounds are present normal.  No masses organomegaly    DISPOSITION: Home  Discharge Instructions     (HEART FAILURE PATIENTS) Call MD:  Anytime you have any of the following symptoms: 1) 3 pound weight gain in 24 hours or 5 pounds in 1 week 2) shortness of breath,  with or without a dry hacking cough 3) swelling in the hands, feet or stomach 4) if you have to sleep on extra pillows at night in order to breathe.   Complete by: As directed    Call MD for:  difficulty breathing, headache or visual disturbances   Complete by: As directed    Call MD for:  extreme fatigue   Complete by: As directed    Call MD for:  persistant dizziness or  light-headedness   Complete by: As directed    Call MD for:  persistant nausea and vomiting   Complete by: As directed    Call MD for:  severe uncontrolled pain   Complete by: As directed    Call MD for:  temperature >100.4   Complete by: As directed    Diet Carb Modified   Complete by: As directed    Discharge instructions   Complete by: As directed    As instructed please get up slowly from a sitting or lying position to avoid sudden drop in blood pressure.  Please continue to wear your compression stockings daily.  Cardiology will arrange outpatient follow-up.  You were cared for by a hospitalist during your hospital stay. If you have any questions about your discharge medications or the care you received while you were in the hospital after you are discharged, you can call the unit and asked to speak with the hospitalist on call if the hospitalist that took care of you is not available. Once you are discharged, your primary care physician will handle any further medical issues. Please note that NO REFILLS for any discharge medications will be authorized once you are discharged, as it is imperative that you return to your primary care physician (or establish a relationship with a primary care physician if you do not have one) for your aftercare needs so that they can reassess your need for medications and monitor your lab values. If you do not have a primary care physician, you can call [phone] for a physician referral.   Increase activity slowly   Complete by: As directed           Allergies as of 05/12/2021       Reactions   Cilostazol Swelling   edema   Trulicity [dulaglutide] Nausea And Vomiting   Levaquin [levofloxacin] Itching, Rash   Lisinopril Itching, Rash   Victoza [liraglutide] Other (See Comments)   Severe fatigue & insomnia        Medication List     STOP taking these medications    metoprolol succinate 25 MG 24 hr tablet Commonly known as: TOPROL-XL        TAKE these medications    acetaminophen 325 MG tablet Commonly known as: TYLENOL Take 325-650 mg by mouth every 6 (six) hours as needed for headache (pain).   acetaminophen-codeine 300-30 MG tablet Commonly known as: TYLENOL #3 Take 1 tablet by mouth every 6 (six) hours as needed (pain).   atorvastatin 40 MG tablet Commonly known as: LIPITOR Take 40 mg by mouth every evening.   B-D UF III MINI PEN NEEDLES 31G X 5 MM Misc Generic drug: Insulin Pen Needle USE AS DIRECTED WITH LANTUS SOLOSTAR   Basaglar KwikPen 100 UNIT/ML Inject 14-34 Units into the skin See admin instructions. Inject 34 units subcutaneously every morning and 14 units every evening   DULoxetine 30 MG capsule Commonly known as: CYMBALTA Take 30 mg by mouth every morning. Take with a 60 mg capsule for a  total morning dose of 90 mg   DULoxetine 60 MG capsule Commonly known as: CYMBALTA Take 60 mg by mouth every morning. Take with a 30 mg capsule for a total morning dose of 90 mg   Eliquis 5 MG Tabs tablet Generic drug: apixaban TAKE 1 TABLET BY MOUTH TWICE A DAY What changed: how much to take   furosemide 20 MG tablet Commonly known as: LASIX Take 20 mg by mouth See admin instructions. Take one tablet (20 mg) by mouth up to three times weekly as needed for swelling/fluid.   hydrOXYzine 25 MG capsule Commonly known as: VISTARIL Take 50 mg by mouth every 8 (eight) hours as needed for itching (sleep).   insulin lispro 100 UNIT/ML KwikPen Commonly known as: HUMALOG Inject 4 Units into the skin daily with lunch.   MAGNESIUM PO Take 350 mg by mouth every evening. Contains blueberry powder 100  mg   melatonin 3 MG Tabs tablet Take 3 mg by mouth daily as needed (for sleep).   multivitamin with minerals Tabs tablet Take 1 tablet by mouth every morning.   nitroGLYCERIN 0.4 MG SL tablet Commonly known as: NITROSTAT Place 0.4 mg under the tongue every 5 (five) minutes as needed for chest pain.    ondansetron 4 MG disintegrating tablet Commonly known as: ZOFRAN-ODT Take 4 mg by mouth every 8 (eight) hours as needed for nausea or vomiting.   OneTouch Ultra test strip Generic drug: glucose blood FOR USE OF CHEACKING BLOOD ONCE DAILY   pantoprazole 40 MG tablet Commonly known as: PROTONIX TAKE 1 TABLET BY MOUTH EVERY DAY What changed: when to take this   pioglitazone 45 MG tablet Commonly known as: ACTOS Take 45 mg by mouth every morning.   Vitamin D3 50 MCG (2000 UT) Tabs Take 2,000 Units by mouth every evening.          Follow-up Information     [name], MD Follow up in 1 week(s).   Specialty: Family Medicine Contact information: [place] [address] [place], [place] [facility] Follow up.   Specialty: [place] Why: For home health services, they will be in contact in 1-2 days to set up your first home visit Contact information: [place] [address] [phone]                 TOTAL DISCHARGE TIME: 35 minutes  [place]  [facility] Pager on [email or web address]  05/13/2021, 12:42 PM

## 2021-05-12 NOTE — Progress Notes (Signed)
Progress Note  Patient Name: Fernando Hess Date of Encounter: 05/12/2021  Primary Cardiologist: Werner Lean, MD   Subjective   Creatinine has increased but come down into 8/28.  Still have positive orthostatics but is asymptomatic.   Inpatient Medications    Scheduled Meds:  apixaban  5 mg Oral BID   atorvastatin  40 mg Oral QPM   DULoxetine  90 mg Oral Daily   insulin aspart  0-9 Units Subcutaneous TID WC   insulin aspart  4 Units Subcutaneous Q lunch   insulin glargine-yfgn  14 Units Subcutaneous QHS   insulin glargine-yfgn  34 Units Subcutaneous Daily   pantoprazole  40 mg Oral Daily   pioglitazone  45 mg Oral q morning   sodium chloride flush  3 mL Intravenous Q12H   Continuous Infusions:  PRN Meds: acetaminophen, acetaminophen-codeine, hydrALAZINE, hydrOXYzine, melatonin, ondansetron   Vital Signs    Vitals:   05/12/21 0247 05/12/21 0314 05/12/21 0622 05/12/21 0748  BP: (!) 164/94 (!) 166/88  134/83  Pulse: 84 76 71 83  Resp: 20 18 (!) 23 17  Temp:  98.1 F (36.7 C)  97.7 F (36.5 C)  TempSrc:  Oral  Axillary  SpO2: 94% 93% 93% 92%  Weight:   88 kg   Height:        Intake/Output Summary (Last 24 hours) at 05/12/2021 1001 Last data filed at 05/11/2021 2100 Gross per 24 hour  Intake --  Output 450 ml  Net -450 ml   Filed Weights   05/12/21 0622  Weight: 88 kg    Telemetry    SR with RBBB; has Mobtiz Typ1 HB - Personally Reviewed  ECG    No new- Personally Reviewed  Physical Exam   GEN: No acute distress.   Neck: No JVD Cardiac: RRR, systolic crescendo no rubs, or gallops.  Respiratory: Clear to auscultation bilaterally. GI: Soft, nontender, non-distended  MS: No edema; No deformity. Neuro:  Nonfocal  Psych: Normal affect   Supine:  BP 160/90  HR 80 Standing:  BP 107/70  HR 70  Asymptomatic   Labs    Chemistry Recent Labs  Lab 05/10/21 0545 05/10/21 0606 05/11/21 0616  NA 135 137 139  K 4.4 4.2 4.8  CL 104  --   103  CO2 24  --  31  GLUCOSE 360*  --  205*  BUN 29*  --  26*  CREATININE 2.04*  --  1.91*  CALCIUM 8.4*  --  8.9  PROT 5.8*  --   --   ALBUMIN 3.2*  --   --   AST 28  --   --   ALT 21  --   --   ALKPHOS 110  --   --   BILITOT 1.1  --   --   GFRNONAA 33*  --  36*  ANIONGAP 7  --  5     Hematology Recent Labs  Lab 05/10/21 0606  HGB 14.6  HCT 43.0    Cardiac EnzymesNo results for input(s): TROPONINI in the last 168 hours. No results for input(s): TROPIPOC in the last 168 hours.   BNP Recent Labs  Lab 05/10/21 0545  BNP 213.1*     DDimer  Recent Labs  Lab 05/10/21 0545  DDIMER <0.27     Radiology    CT HEAD WO CONTRAST (5MM)  Result Date: 05/10/2021 CLINICAL DATA:  Intermittent shortness of breath, heart palpitations, head trauma EXAM: CT HEAD WITHOUT CONTRAST TECHNIQUE:  Contiguous axial images were obtained from the base of the skull through the vertex without intravenous contrast. COMPARISON:  10/13/2018 FINDINGS: Brain: No acute infarct or hemorrhage. Lateral ventricles and midline structures are stable. No acute extra-axial fluid collections. No mass effect. Vascular: No hyperdense vessel or unexpected calcification. Skull: Normal. Negative for fracture or focal lesion. Sinuses/Orbits: Minimal polypoid mucosal thickening within the maxillary sinuses. Remaining sinuses are clear. Other: None. IMPRESSION: 1. No acute intracranial process. Electronically Signed   By: Randa Ngo M.D.   On: 05/10/2021 18:15   ECHOCARDIOGRAM COMPLETE  Result Date: 05/10/2021    ECHOCARDIOGRAM REPORT   Patient Name:   Fernando Hess Date of Exam: 05/10/2021 Medical Rec #:  [id number]      Height:       64.0 in Accession #:    [id number]     Weight:       194.2 lb Date of Birth:  [birth year]      BSA:          1.932 m Patient Age:    75 years       BP:           165/84 mmHg Patient Gender: M              HR:           87 bpm. Exam Location:  Inpatient Procedure: 2D Echo Indications:     acute diastolic chf  History:        Patient has prior history of Echocardiogram examinations, most                 recent 11/19/2019. CAD, Prior CABG, Signs/Symptoms:Syncope; Risk                 Factors:Hypertension, Dyslipidemia and Sleep Apnea.  Sonographer:    Johny Chess RDCS Referring Phys: [address] [place]  1. Left ventricular ejection fraction, by estimation, is 60 to 65%. The left ventricle has normal function. Left ventricular endocardial border not optimally defined to evaluate regional wall motion. There is mild left ventricular hypertrophy. Left ventricular diastolic parameters are consistent with Grade I diastolic dysfunction (impaired relaxation).  2. Right ventricular systolic function is mildly reduced. The right ventricular size is mildly enlarged. Tricuspid regurgitation signal is inadequate for assessing PA pressure.  3. Left atrial size was mildly dilated.  4. The mitral valve is degenerative. Trivial mitral valve regurgitation. No evidence of mitral stenosis.  5. The aortic valve is abnormal. There is moderate calcification of the aortic valve. There is moderate thickening of the aortic valve. Aortic valve regurgitation is trivial. Mild aortic valve stenosis. Aortic valve area, by VTI measures 1.81 cm. Aortic valve mean gradient measures 11.7 mmHg. Aortic valve Vmax measures 2.50 m/s.  6. The inferior vena cava is normal in size with greater than 50% respiratory variability, suggesting right atrial pressure of 3 mmHg. FINDINGS  Left Ventricle: Left ventricular ejection fraction, by estimation, is 60 to 65%. The left ventricle has normal function. Left ventricular endocardial border not optimally defined to evaluate regional wall motion. The left ventricular internal cavity size was normal in size. There is mild left ventricular hypertrophy. Abnormal (paradoxical) septal motion consistent with post-operative status. Left ventricular diastolic parameters are consistent with  Grade I diastolic dysfunction (impaired relaxation). Right Ventricle: The right ventricular size is mildly enlarged. No increase in right ventricular wall thickness. Right ventricular systolic function is mildly reduced. Tricuspid regurgitation signal is inadequate for assessing PA pressure.  Left Atrium: Left atrial size was mildly dilated. Right Atrium: Right atrial size was not well visualized. Pericardium: There is no evidence of pericardial effusion. Mitral Valve: The mitral valve is degenerative in appearance. Mild to moderate mitral annular calcification. Trivial mitral valve regurgitation. No evidence of mitral valve stenosis. Tricuspid Valve: The tricuspid valve is normal in structure. Tricuspid valve regurgitation is trivial. No evidence of tricuspid stenosis. Aortic Valve: The aortic valve is abnormal. There is moderate calcification of the aortic valve. There is moderate thickening of the aortic valve. Aortic valve regurgitation is trivial. Mild aortic stenosis is present. Aortic valve mean gradient measures  11.7 mmHg. Aortic valve peak gradient measures 25.0 mmHg. Aortic valve area, by VTI measures 1.81 cm. Pulmonic Valve: The pulmonic valve was normal in structure. Pulmonic valve regurgitation is trivial. No evidence of pulmonic stenosis. Aorta: The aortic root is normal in size and structure. Venous: The inferior vena cava is normal in size with greater than 50% respiratory variability, suggesting right atrial pressure of 3 mmHg. IAS/Shunts: No atrial level shunt detected by color flow Doppler.  LEFT VENTRICLE PLAX 2D LVIDd:         4.10 cm  Diastology LVIDs:         2.70 cm  LV e' medial:    6.42 cm/s LV PW:         1.00 cm  LV E/e' medial:  13.4 LV IVS:        1.30 cm  LV e' lateral:   9.46 cm/s LVOT diam:     2.20 cm  LV E/e' lateral: 9.1 LV SV:         72 LV SV Index:   37 LVOT Area:     3.80 cm  RIGHT VENTRICLE            IVC RV S prime:     9.79 cm/s  IVC diam: 1.70 cm LEFT ATRIUM              Index LA diam:        3.90 cm 2.02 cm/m LA Vol (A2C):   43.8 ml 22.67 ml/m LA Vol (A4C):   66.8 ml 34.57 ml/m LA Biplane Vol: 55.8 ml 28.88 ml/m  AORTIC VALVE AV Area (Vmax):    1.80 cm AV Area (Vmean):   1.96 cm AV Area (VTI):     1.81 cm AV Vmax:           249.76 cm/s AV Vmean:          146.016 cm/s AV VTI:            0.398 m AV Peak Grad:      25.0 mmHg AV Mean Grad:      11.7 mmHg LVOT Vmax:         118.00 cm/s LVOT Vmean:        75.100 cm/s LVOT VTI:          0.189 m LVOT/AV VTI ratio: 0.48  AORTA Ao Root diam: 3.00 cm Ao Asc diam:  3.70 cm MITRAL VALVE MV Area (PHT): 3.72 cm     SHUNTS MV Decel Time: 204 msec     Systemic VTI:  0.19 m MV E velocity: 85.90 cm/s   Systemic Diam: 2.20 cm MV A velocity: 118.00 cm/s MV E/A ratio:  0.73 Cherlynn Kaiser MD Electronically signed by Cherlynn Kaiser MD Signature Date/Time: 05/10/2021/5:39:18 PM    Final     Cardiac Studies   Cardiac Event Monitoring: Date:  05/09/20 Results: Sinus bradycardia with minimum rate 48 BPM, pauses up to 3 seconds even during awake hours. Frequent epsiodes of atrial fibrillation with RVR (2% of the monitoring time).   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    Transthoracic Echocardiogram: Date:05/10/21 Results:Mild AS with normal LVSVI  1. Left ventricular ejection fraction, by estimation, is 60 to 65%. The  left ventricle has normal function. Left ventricular endocardial border  not optimally defined to evaluate regional wall motion. There is mild left  ventricular hypertrophy. Left  ventricular diastolic parameters are consistent with Grade I diastolic  dysfunction (impaired relaxation).   2. Right ventricular systolic function is mildly reduced. The right  ventricular size is mildly enlarged. Tricuspid regurgitation signal is  inadequate for assessing PA pressure.   3. Left atrial size was mildly dilated.   4. The mitral valve is degenerative.  Trivial mitral valve regurgitation.  No evidence of mitral stenosis.   5. The aortic valve is abnormal. There is moderate calcification of the  aortic valve. There is moderate thickening of the aortic valve. Aortic  valve regurgitation is trivial. Mild aortic valve stenosis. Aortic valve  area, by VTI measures 1.81 cm.  Aortic valve mean gradient measures 11.7 mmHg. Aortic valve Vmax measures  2.50 m/s.   6. The inferior vena cava is normal in size with greater than 50%  respiratory variability, suggesting right atrial pressure of 3 mmHg.   ECG or NM Stress Testing : Date: 03/20/20 Results: The left ventricular ejection fraction is normal (55-65%). Nuclear stress EF: 65%. There was no ST segment deviation noted during stress. No T wave inversion was noted during stress. This is a low risk study. No significant change since prior study.   Small defect of mild severity at apex, similar to prior. Given normal wall motion, suspect apical thinning artifact. No ischemia.  Left/Right Heart Catheterizations: Date: 10/22/2015 Results: Ost LM to LM lesion, 50% stenosed. Mid LAD lesion, 90% stenosed. The LIMA to LAD graft is widely patent Mid RCA lesion, 50% stenosed. SVG . The saphenous vein graft to right PDA is widely patent without stenosis SVG . The saphenous vein graft to first diagonal is widely patent. This graft retrograde fills the LAD territory, with relatively brisk flow into the proximal septal perforators. Ost Ramus to Ramus lesion, 90% stenosed. Ost 1st Mrg to 1st Mrg lesion, 80% stenosed. Prox Cx lesion, 80% stenosed. 2nd Mrg lesion, 100% stenosed. SVG . This graft is occluded just beyond the aortic anastomosis This graft is occluded in the proximal body Origin to Prox Graft lesion, 100% stenosed. Prox Graft lesion, 100% stenosed.   1. 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 S/P recent CABG with  patency of the LIMA-LAD, SVG-PDA, and SVG-diagonal 3. Total occlusion of the SVG-OM1 and SVG-OM2 4. Total occlusion of the distal OM2, suspected culprit for STEMI   Recommend: medical therapy, add clopidogrel, resume heparin in 8 hrs. With diffuse diabetic CAD, may be best to manage medically rather than proceed with complex PCI of the left circumflex branches. I felt best not to acutely intervene on the distal OM2 as the vessel terminates not far beyond the area of occlusion based on preop coronary angiogram review.   Patient Profile     75 y.o. 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  Seen for two days of shortness of breath.  Assessment & Plan    DOE CKD stage IIIb Mild AS - resolved - PRN  lasix (20 mg PO) at discharge   Syncope, PAF Orthostatic hypotension - d/c BB with no further syncope - will get DC heart monitor at DC and need outpatient EP f/u (working to arrange) - eliquis - can encourage hydraation; this may have been the cause of his syncope, though he is asymptomatic at this time  CAD: - anginal equivalent in the past was neck pain - rx medically, no recent c/o chest pain - had CP in 2017 - ipitor 40 mg qd - no ASA due to Eliquis - MV in 2021 was OK - concern that SOB is anginal equivalent, although he had CP in the past when he had MI   DM - hypergylcemia no hypoglycemia - IM   [facility] will sign off.   Medication Recommendations:  eliquis, statin, PRN lasix BB held this admission Other recommendations (labs, testing, etc):  BMP in one week; heart monitor will be sent out Follow up as an outpatient:  We are working to arrange follow up    For questions or updates, please contact [place] Please consult [email or web address] for contact info under Cardiology/STEMI.      Signed, Werner Lean, MD   05/12/2021, 10:01 AM

## 2021-05-12 NOTE — Progress Notes (Signed)
Patient noted to be off monitor; when checked, patient;s primofit was on the floor, tele leads were pulled out and pulse oximeter was also off. Patient stated "I got confused, I thought I was at home and did not know what was going on". Patient able to answer all LOC questions. Declined replacment of primofit. Patient was assisted to the bathroom with a walker; gait noted to be unsteady. Bed alarm on, bed on lowest position; non-skid cocks on. Reminded to call for assistance.

## 2021-05-12 NOTE — Evaluation (Addendum)
Physical Therapy Evaluation Patient Details Name: Fernando Hess MRN: [id number] DOB: [birth year] Today's Date: 05/12/2021   History of Present Illness  Pt is a 75 y.o. M who was admitted for eavluation of syncope and exertional dyspnea. Exertional dyspnea felt to be due to HF pEF exacerbation and cardiology plans outpatient herat monitoring for syncope. Significant PMH CAD s/p CABG 2017, PAF, HTN, DM2, CKD stage III.  Clinical Impression  PTA, pt lives with his spouse and is independent with mobility using a RW. Reports history of falls in setting of syncopal episodes. Pt presents with decreased functional mobility secondary to generalized weakness, decreased cardiopulmonary endurance and balance deficits. Pt positive for orthostatic hypotension upon assessment with TED hose donned, but is asymptomatic. Ambulating 100 ft with a walker at a min guard assist level. Recommend follow up HHPT to address deficits and maximize functional mobility.   Orthostatic Vital Signs: Supine: 151/91 (110) Sitting: 123/66 (80) Standing: 88/62 (71) Return to sitting: 134/82 (99)    Follow Up Recommendations Home health PT;Supervision/Assistance - 24 hour    Equipment Recommendations  None recommended by PT (pt has needed DME)    Recommendations for Other Services       Precautions / Restrictions Precautions Precautions: Fall;Other (comment) Precaution Comments: orthostatic hypotension, has TED hose Restrictions Weight Bearing Restrictions: No      Mobility  Bed Mobility Overal bed mobility: Modified Independent                  Transfers Overall transfer level: Needs assistance Equipment used: Rolling walker (2 wheeled) Transfers: Sit to/from Stand Sit to Stand: Supervision         General transfer comment: cues for hand placement  Ambulation/Gait Ambulation/Gait assistance: Min guard Gait Distance (Feet): 100 Feet Assistive device: Rolling walker (2 wheeled) Gait  Pattern/deviations: Step-through pattern;Decreased stride length;Shuffle;Decreased dorsiflexion - right;Decreased dorsiflexion - left Gait velocity: decreased   General Gait Details: slow and steady pace, cues for upward gaze and walker proximity, decreased bilateral foot clearance  Stairs            Wheelchair Mobility    Modified Rankin (Stroke Patients Only)       Balance Overall balance assessment: Needs assistance Sitting-balance support: Feet supported Sitting balance-Leahy Scale: Good     Standing balance support: Bilateral upper extremity supported Standing balance-Leahy Scale: Poor Standing balance comment: reliant on BUE support                             Pertinent Vitals/Pain Pain Assessment: No/denies pain    Home Living Family/patient expects to be discharged to:: Private residence Living Arrangements: Spouse/significant other Available Help at Discharge: Family Type of Home: House Home Access: Stairs to enter   Technical brewer of Steps: 5 Home Layout: Able to live on main level with bedroom/bathroom Home Equipment: Walker - 2 wheels;Bedside commode;Wheelchair - manual;Cane - single point      Prior Function Level of Independence: Independent with assistive device(s)         Comments: using RW, reports history of falls due to syncopal episodes     Hand Dominance        Extremity/Trunk Assessment   Upper Extremity Assessment Upper Extremity Assessment: Overall WFL for tasks assessed    Lower Extremity Assessment Lower Extremity Assessment: Generalized weakness    Cervical / Trunk Assessment Cervical / Trunk Assessment: Kyphotic  Communication   Communication: No difficulties  Cognition Arousal/Alertness: Awake/alert Behavior  During Therapy: WFL for tasks assessed/performed Overall Cognitive Status: No family/caregiver present to determine baseline cognitive functioning                                  General Comments: Difficulty recalling granddaughter's name when showing me a picture      General Comments      Exercises General Exercises - Lower Extremity Ankle Circles/Pumps: Both;20 reps;Seated Long Arc Quad: Both;10 reps;Seated   Assessment/Plan    PT Assessment Patient needs continued PT services  PT Problem List Decreased strength;Decreased activity tolerance;Decreased balance;Decreased mobility       PT Treatment Interventions DME instruction;Gait training;Stair training;Functional mobility training;Therapeutic exercise;Therapeutic activities;Balance training;Patient/family education    PT Goals (Current goals can be found in the Care Plan section)  Acute Rehab PT Goals Patient Stated Goal: return to prior activity level PT Goal Formulation: With patient Time For Goal Achievement: 05/26/21 Potential to Achieve Goals: Good    Frequency Min 3X/week   Barriers to discharge        Co-evaluation               AM-PAC PT "6 Clicks" Mobility  Outcome Measure Help needed turning from your back to your side while in a flat bed without using bedrails?: None Help needed moving from lying on your back to sitting on the side of a flat bed without using bedrails?: None Help needed moving to and from a bed to a chair (including a wheelchair)?: A Little Help needed standing up from a chair using your arms (e.g., wheelchair or bedside chair)?: A Little Help needed to walk in hospital room?: A Little Help needed climbing 3-5 steps with a railing? : A Lot 6 Click Score: 19    End of Session Equipment Utilized During Treatment: Gait belt Activity Tolerance: Patient tolerated treatment well Patient left: in chair;with call bell/phone within reach Nurse Communication: Mobility status PT Visit Diagnosis: Unsteadiness on feet (R26.81);Muscle weakness (generalized) (M62.81);Difficulty in walking, not elsewhere classified (R26.2)    Time: 1207-1228 PT Time Calculation  (min) (ACUTE ONLY): 21 min   Charges:   PT Evaluation $PT Eval Moderate Complexity: 1 Mod          Wyona Almas, PT, DPT Acute Rehabilitation Services Pager [phone] Office [phone]   Deno Etienne 05/12/2021, 12:47 PM

## 2021-05-12 NOTE — Progress Notes (Signed)
Was able to reach pt's wife on the phone. She is unable to pick the pt up for d/c until 1830 tonight.

## 2021-05-13 ENCOUNTER — Ambulatory Visit (INDEPENDENT_AMBULATORY_CARE_PROVIDER_SITE_OTHER): Payer: Medicare Other

## 2021-05-13 ENCOUNTER — Telehealth: Payer: Self-pay | Admitting: Internal Medicine

## 2021-05-13 DIAGNOSIS — R55 Syncope and collapse: Secondary | ICD-10-CM

## 2021-05-13 NOTE — Telephone Encounter (Signed)
Spoke with the pts wife (on [place]), and endorsed to her, as indicated in Dr. Oralia Rud assessment and plan on the pt from the hospital, the reason why the pt was ordered to have an outpatient monitor done.  Spent over 15 mins on the phone with the pts wife discussing this.  Wife verbalized understanding and agrees with this plan. Wife was more than gracious for all the assistance provided.  Will send this message to our monitor techs, to make them aware to send out the pts monitor, as ordered at discharge from the hospital.

## 2021-05-13 NOTE — Telephone Encounter (Signed)
Assessment & Plan    DOE CKD stage IIIb Mild AS - resolved - PRN  lasix (20 mg PO) at discharge   Syncope, PAF Orthostatic hypotension - d/c BB with no further syncope - will get DC heart monitor at DC and need outpatient EP f/u (working to arrange) - eliquis - can encourage hydraation; this may have been the cause of his syncope, though he is asymptomatic at this time   CAD: - anginal equivalent in the past was neck pain - rx medically, no recent c/o chest pain - had CP in 2017 - ipitor 40 mg qd - no ASA due to Eliquis - MV in 2021 was OK - concern that SOB is anginal equivalent, although he had CP in the past when he had MI   DM - hypergylcemia no hypoglycemia - IM    [facility] will sign off.   Medication Recommendations:  eliquis, statin, PRN lasix BB held this admission Other recommendations (labs, testing, etc):  BMP in one week; heart monitor will be sent out Follow up as an outpatient:  We are working to arrange follow up       For questions or updates, please contact [place] Please consult [email or web address] for contact info under Cardiology/STEMI.      Signed, Werner Lean, MD  05/12/2021, 10:01 AM

## 2021-05-13 NOTE — Telephone Encounter (Signed)
Patient's wife would like to know why her husband is having a heart monitor mailed out to them. She states that this was not on his discharge papers and would like some more information regarding this.

## 2021-05-13 NOTE — Progress Notes (Unsigned)
Patient enrolled for [employer] to mail a 14 day ZIO AT monitor to his home.

## 2021-05-16 ENCOUNTER — Other Ambulatory Visit: Payer: Self-pay

## 2021-05-16 DIAGNOSIS — R55 Syncope and collapse: Secondary | ICD-10-CM

## 2021-05-29 NOTE — Telephone Encounter (Signed)
Spoke with the patient and his wife. The patient was discharged from the hospital about 3 weeks ago. He was feeling good after coming home for about three days but has since become SOB. He reports that he was given Lasix to take as needed. He has been taking it daily for about a week now. He denies any chest pain or swelling. He states that shortness of breath is at rest and with exertion. He hasn't been able to sleep very well.  His wife reports other than his shortness of breath he has not had any problems since being discharged. He has not had any falls. His blood pressure is 137/78 and heart rate 75. They do not have a pulse oximeter. The patient has been scheduled to see the DOD tomorrow 9/15. Advised patient and wife on return to ER precautions of worsening symptoms. They verbalized understanding.

## 2021-05-30 ENCOUNTER — Ambulatory Visit (INDEPENDENT_AMBULATORY_CARE_PROVIDER_SITE_OTHER): Payer: Medicare Other | Admitting: Internal Medicine

## 2021-05-30 ENCOUNTER — Encounter: Payer: Self-pay | Admitting: Internal Medicine

## 2021-05-30 ENCOUNTER — Other Ambulatory Visit: Payer: Self-pay

## 2021-05-30 VITALS — BP 122/80 | HR 92 | Ht 66.0 in | Wt 191.4 lb

## 2021-05-30 DIAGNOSIS — I5033 Acute on chronic diastolic (congestive) heart failure: Secondary | ICD-10-CM

## 2021-05-30 DIAGNOSIS — I5032 Chronic diastolic (congestive) heart failure: Secondary | ICD-10-CM

## 2021-05-30 DIAGNOSIS — I25810 Atherosclerosis of coronary artery bypass graft(s) without angina pectoris: Secondary | ICD-10-CM

## 2021-05-30 NOTE — H&P (View-Only) (Signed)
Cardiology Office Note:    Date:  05/30/2021   ID:  Fernando Hess, DOB December 11, 1945, MRN [id number]  PCP:  [name], MD   [place] Providers Cardiologist:  Werner Lean, MD Electrophysiologist:  Vickie Epley, MD     Referring MD: [name], MD   CC: Hospital follow up  History of Present Illness:    Fernando Hess is a 75 y.o. male with a hx of  MI/CABG 09/2015 w/ LIMA-LAD, SVG-Diag, SVG-RI, SVG-OM, SVG-PDA, STEMI 10/2015 2nd occluded SVG-OM1 & SVG-PDA >> med rx, nl MV 2021, DM, HTN, HLD, GERD, CKD IIIb, OSA on CPAP, Afib on Eliquis w/ tachy/brady seen with near syncope and SOB 05/10/21.  In interim of this visit, patient had started lasix as PRN only instead of 20 mg PO daily scheduled.  Seen 05/30/21.  Patient notes that he is doing terribly.  Since medicine eval he feels tired and weak.  He has lost of energy and have blood sugars all out of control.    Needs a walker.  Is SOB and needs lasix every day. Wife notes that he isn't supposed to take more than lasix 3X a week because of prior kidney issues requiring HD classes (stage IV CKD).  Notes no chest pain (his prior anginal equivalent was neck pain).  Wife thinks he needs a heart catheterization as he hasn't had one before (we discussed prior LHC, stress testing, and risks of CIN).  Notes that he has increase fatigue with exertion couple with heart skips.  He is sending in his [place] today and is eager for the results.   Past Medical History:  Diagnosis Date   Acute renal failure (ARF) ([place]) 09/24/2015   Atrial premature complexes    CAD (coronary artery disease) of artery bypass graft    Early occlusion of saphenous vein graft to intermediate and marginal branch in February 2007 following bypass grafting    CAD (coronary artery disease), native coronary artery 2017   hx NSTEMI 09-24-2015  s/p  CABG x5 on 10-02-2015;  post op STEMI inferolateral wall,  SVG OM1 and SVG OM2 occluded, distal OM  occlusion the calpruit, treated medically // Myoview 7/21: no ischemia, EF 65, low risk   CKD (chronic kidney disease), stage III ([name])    Elevated troponin    Erectile dysfunction    Esophageal reflux    History of atrial fibrillation    post op CABG 10-02-2015   History of non-ST elevation myocardial infarction (NSTEMI) 09/24/2015   s/p  CABG x5   History of ST elevation myocardial infarction (STEMI) 10/22/2015   inferior wall,  post op CABG 10-02-2015   Hyperlipidemia    Hypertension    Left ureteral stone    Mild atherosclerosis of both carotid arteries    Nephrolithiasis    per CT bilateral non-obstructive calculi   OSA (obstructive sleep apnea)    Peripheral artery disease    LE Arterial US 01/2019: R PTA and ATA occluded; L ATA occluded   RBBB (right bundle branch block)    Renal atrophy, right    Sleep apnea    wears cpap    ST elevation myocardial infarction (STEMI) of inferior wall ([place]) 10/22/2015   Type 2 diabetes mellitus treated with insulin ([place])    followed by pcp   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 ([place]) 03/01/2008   Wears glasses     Past Surgical History:  Procedure Laterality Date   [place] N/A 09/26/2015   Procedure: Left Heart Cath and Coronary Angiography;  Surgeon: Troy Sine, MD;  Location: [facility];  Service: Cardiovascular;  Laterality: N/A;   CARDIAC CATHETERIZATION N/A 10/22/2015   Procedure: Left Heart Cath and Coronary Angiography;  Surgeon: Sherren Mocha, MD;  Location: [facility];  Service: Cardiovascular;  Laterality: N/A;   CATARACT EXTRACTION W/ INTRAOCULAR LENS  IMPLANT, BILATERAL  2017   COLONOSCOPY     CORONARY ARTERY BYPASS GRAFT N/A 10/02/2015   Procedure: CORONARY ARTERY BYPASS GRAFTING (CABG) X5 LIMA-LAD; SVG-DIAG; SVG-OM; SVG-PD; SVG-RAMUS TRANSESOPHAGEAL ECHOCARDIOGRAM (TEE) ENDOSCOPIC GREATER  SAPHENOUS VEIN  HARVEST BILAT LE;  Surgeon: Ivin Poot, MD;  Location: [facility];  Service: Open Heart Surgery;  Laterality: N/A;   CYSTOSCOPY/URETEROSCOPY/HOLMIUM LASER/STENT PLACEMENT Left 08/10/2018   Procedure: CYSTOSCOPY/URETEROSCOPY/HOLMIUM LASER/STENT PLACEMENT;  Surgeon: Festus Aloe, MD;  Location: [facility];  Service: Urology;  Laterality: Left;   CYSTOSCOPY/URETEROSCOPY/HOLMIUM LASER/STENT PLACEMENT Left 09/10/2018   Procedure: CYSTOSCOPY/URETEROSCOPY/HOLMIUM LASER/STENT EXCHANGE;  Surgeon: Festus Aloe, MD;  Location: [facility];  Service: Urology;  Laterality: Left;   LEFT HEART CATHETERIZATION WITH CORONARY ANGIOGRAM N/A 04/13/2014   Procedure: LEFT HEART CATHETERIZATION WITH CORONARY ANGIOGRAM;  Surgeon: Jacolyn Reedy, MD;  Location: [facility];  Service: Cardiovascular;  Laterality: N/A;   LEG SURGERY Right age 33   closed reduction leg fracture   POLYPECTOMY     TEE WITHOUT CARDIOVERSION N/A 10/02/2015   Procedure: TRANSESOPHAGEAL ECHOCARDIOGRAM (TEE);  Surgeon: Ivin Poot, MD;  Location: [facility];  Service: Open Heart Surgery;  Laterality: N/A;   URETEROSCOPY WITH HOLMIUM LASER LITHOTRIPSY Bilateral 2004;  2005  dr Risa Grill  @[facility]    VASECTOMY      Current Medications: Current Meds  Medication Sig   acetaminophen (TYLENOL) 325 MG tablet Take 325-650 mg by mouth every 6 (six) hours as needed for headache (pain).   acetaminophen-codeine (TYLENOL #3) 300-30 MG tablet Take 1 tablet by mouth every 6 (six) hours as needed (pain).   albuterol (VENTOLIN HFA) 108 (90 Base) MCG/ACT inhaler as needed.   atorvastatin (LIPITOR) 40 MG tablet Take 40 mg by mouth every evening.   B-D UF III MINI PEN NEEDLES 31G X 5 MM MISC USE AS DIRECTED WITH LANTUS SOLOSTAR   Cholecalciferol (VITAMIN D3) 50 MCG (2000 UT) TABS Take 2,000 Units by mouth every evening.   DULoxetine (CYMBALTA) 30 MG capsule Take 30 mg by mouth every morning. Take with a 60 mg capsule for a total  morning dose of 90 mg   DULoxetine (CYMBALTA) 60 MG capsule Take 60 mg by mouth every morning. Take with a 30 mg capsule for a total morning dose of 90 mg   ELIQUIS 5 MG TABS tablet TAKE 1 TABLET BY MOUTH TWICE A DAY   fluticasone (FLONASE) 50 MCG/ACT nasal spray as needed.   furosemide (LASIX) 20 MG tablet Take 20 mg by mouth See admin instructions. Take one tablet (20 mg) by mouth up to three times weekly as needed for swelling/fluid.   hydrOXYzine (VISTARIL) 25 MG capsule Take 50 mg by mouth every 8 (eight) hours as needed for itching (sleep).   Insulin Glargine (BASAGLAR KWIKPEN) 100 UNIT/ML SOPN Inject 14-34 Units into the skin See admin instructions. Inject 34 units subcutaneously every morning and 14 units every evening   insulin lispro (HUMALOG) 100 UNIT/ML KwikPen Inject 4 Units into the skin daily with lunch.  MAGNESIUM PO Take 350 mg by mouth every evening. Contains blueberry powder 100  mg   Melatonin 3 MG TABS Take 3 mg by mouth daily as needed (for sleep).   Multiple Vitamin (MULTIVITAMIN WITH MINERALS) TABS tablet Take 1 tablet by mouth every morning.   nitroGLYCERIN (NITROSTAT) 0.4 MG SL tablet Place 0.4 mg under the tongue every 5 (five) minutes as needed for chest pain.   ondansetron (ZOFRAN-ODT) 4 MG disintegrating tablet Take 4 mg by mouth every 8 (eight) hours as needed for nausea or vomiting.   ONETOUCH ULTRA test strip FOR USE OF CHEACKING BLOOD ONCE DAILY   pantoprazole (PROTONIX) 40 MG tablet TAKE 1 TABLET BY MOUTH EVERY DAY   [DISCONTINUED] pioglitazone (ACTOS) 45 MG tablet Take 45 mg by mouth every morning.     Allergies:   Cilostazol, Trulicity [dulaglutide], Levaquin [levofloxacin], Lisinopril, and Victoza [liraglutide]   Social History   Socioeconomic History   Marital status: Married    Spouse name: Not on file   Number of children: Not on file   Years of education: Not on file   Highest education level: Not on file  Occupational History   Occupation:  retired  Tobacco Use   Smoking status: Never   Smokeless tobacco: Never  Vaping Use   Vaping Use: Never used  Substance and Sexual Activity   Alcohol use: Not Currently   Drug use: Never   Sexual activity: Not on file  Other Topics Concern   Not on file  Social History Narrative   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 There is no history of Colon cancer, Esophageal cancer, Prostate cancer, Rectal cancer, Stomach cancer, or Colon polyps.  ROS:   Please see the history of present illness.     All other systems reviewed and are negative.  EKGs/Labs/Other Studies Reviewed:    The following studies were reviewed today:   EKG:  EKG is  ordered today.  The ekg ordered today demonstrates  05/30/21: SR rate 90 rare PVCS (RBBB)  30-DAY MONITOR: 05/09/2021 Sinus bradycardia with minimum rate 48 BPM, pauses up to 3 seconds even during awake hours. Frequent epsiodes of atrial fibrillation with RVR (2% of the monitoring time).   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      MYOVIEW: 03/20/2020 The left ventricular ejection fraction is normal (55-65%). Nuclear stress EF: 65%. There was no ST segment deviation noted during stress. No T wave inversion was noted during stress. This is a low risk study. No significant change since prior study.   Small defect of mild severity at apex, similar to prior. Given normal wall motion, suspect apical thinning artifact. No ischemia.   ECHO: 05/10/21  1. Left  ventricular ejection fraction, by estimation, is 60 to 65%. The  left ventricle has normal function. Left ventricular endocardial border  not optimally defined to evaluate regional wall motion. There is mild left  ventricular hypertrophy. Left  ventricular diastolic parameters are consistent with Grade I diastolic  dysfunction (impaired relaxation).   2. Right ventricular systolic function is mildly reduced. The right  ventricular size is mildly enlarged. Tricuspid regurgitation signal is  inadequate for assessing PA pressure.   3. Left atrial size was mildly dilated.   4. The mitral valve is degenerative. Trivial mitral valve regurgitation.  No evidence of mitral stenosis.   5. The aortic valve is abnormal. There is moderate calcification of the  aortic valve. There is moderate thickening of the aortic valve. Aortic  valve regurgitation is trivial. Mild aortic valve stenosis. Aortic valve  area, by VTI measures 1.81 cm.  Aortic valve mean gradient measures 11.7 mmHg. Aortic valve Vmax measures  2.50 m/s.   6. The inferior vena cava is normal in size with greater than 50%  respiratory variability, suggesting right atrial pressure of 3 mmHg.    CARDIAC CATH: 10/22/2015 Ost LM to LM lesion, 50% stenosed. Mid LAD lesion, 90% stenosed. The LIMA to LAD graft is widely patent Mid RCA lesion, 50% stenosed. SVG . The saphenous vein graft to right PDA is widely patent without stenosis SVG . The saphenous vein graft to first diagonal is widely patent. This graft retrograde fills the LAD territory, with relatively brisk flow into the proximal septal perforators. Ost Ramus to Ramus lesion, 90% stenosed. Ost 1st Mrg to 1st Mrg lesion, 80% stenosed. Prox Cx lesion, 80% stenosed. 2nd Mrg lesion, 100% stenosed. SVG . This graft is occluded just beyond the aortic anastomosis This graft is occluded in the proximal body Origin to Prox Graft lesion, 100% stenosed. Prox Graft lesion, 100%  stenosed.   1. 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 S/P recent CABG with patency of the LIMA-LAD, SVG-PDA, and SVG-diagonal 3. Total occlusion of the SVG-OM1 and SVG-OM2 4. Total occlusion of the distal OM2, suspected culprit for STEMI  Recent Labs: 06/11/2020: TSH 0.815 06/25/2020: Platelets 261 05/10/2021: ALT 21; B Natriuretic Peptide 213.1; Hemoglobin 14.6; Magnesium 1.9 05/11/2021: BUN 26; Creatinine, Ser 1.91; Potassium 4.8; Sodium 139  Recent Lipid Panel    Component Value Date/Time   CHOL 129 06/11/2020 0915   TRIG 84 06/11/2020 0915   HDL 41 06/11/2020 0915   CHOLHDL 3.1 06/11/2020 0915   CHOLHDL 2.7 10/23/2015 0515   VLDL 20 10/23/2015 0515   LDLCALC 72 06/11/2020 0915     Risk Assessment/Calculations:    CHA2DS2-VASc Score = 6   This indicates a 9.7% annual risk of stroke. The patient's score is based upon: CHF History: 1 HTN History: 1 Diabetes History: 1 Stroke History: 0 Vascular Disease History: 1 Age Score: 2 Gender Score: 0          Physical Exam:    VS:  BP 122/80   Pulse 92   Ht 5\' 6"  (1.676 m)   Wt 191 lb 6.4 oz (86.8 kg)   SpO2 95%   BMI 30.89 kg/m     Wt Readings from Last 3 Encounters:  05/30/21 191 lb 6.4 oz (86.8 kg)  05/12/21 194 lb 0.1 oz (88 kg)  12/04/20 194 lb 3.2 oz (88.1 kg)     GEN:  Well nourished, well developed in no acute distress HEENT: Normal NECK: No JVD; No carotid bruits LYMPHATICS: No lymphadenopathy CARDIAC: RRR, no murmurs, rubs, gallops RESPIRATORY:  Clear to auscultation without rales, wheezing or rhonchi  ABDOMEN: Soft, non-tender, non-distended MUSCULOSKELETAL:  No edema; No deformity  SKIN: Warm and dry NEUROLOGIC:  Alert and oriented x  3 PSYCHIATRIC:  Normal affect   ASSESSMENT:    1. Chronic heart failure with preserved ejection fraction ([place])   2. Coronary artery disease involving coronary bypass graft of native heart  without angina pectoris   3. Acute on chronic diastolic CHF (congestive heart failure) ([name])    PLAN:    DOE with concern for HFpEF CKD stage IIIb - BMP & Mg today - given concerns about volume status and whether or not his symptoms are cardiac, will pursue RHC for definitive answer - if creatinine is elevated hold lasix until RHC  CAD: - Had early graft closure in 2017, 1 month after CABG (SVG-OM and SVG-RI were occluded) - on Lipitor 40 mg qd - no ASA due to Eliquis   PAF- CHASDVASC 6 Near Syncope with hx of pauses (conversion and SR - follow up ZioPatch with potential loop vs. PPM (patient is not sure whom he would like to follow up from EP)   6-8 weeks f/u  Shared Decision Making/Informed Consent The risks [stroke (1 in 1000), death (1 in 1000), kidney failure [usually temporary] (1 in 500), bleeding (1 in 200), allergic reaction [possibly serious] (1 in 200)], benefits (diagnostic support and management of coronary artery disease) and alternatives of a cardiac catheterization were discussed in detail with Mr. Wenger and he is willing to proceed.   Time Spent Directly with Patient:   I have spent a total of 50 minutes with the patient reviewing notes, imaging, EKGs, labs and examining the patient as well as establishing an assessment and plan that was discussed personally with the patient.  > 50% of time was spent in direct patient care and family.    Medication Adjustments/Labs and Tests Ordered: Current medicines are reviewed at length with the patient today.  Concerns regarding medicines are outlined above.  Orders Placed This Encounter  Procedures   Basic metabolic panel   Magnesium   CBC   EKG 12-Lead   No orders of the defined types were placed in this encounter.   Patient Instructions  Medication Instructions:  Your physician recommends that you continue on your current medications as directed. Please refer to the Current Medication list given to you  today.  *If you need a refill on your cardiac medications before your next appointment, please call your pharmacy*   Lab Work: TOMORROW: CBC, BMP, Mg  If you have labs (blood work) drawn today and your tests are completely normal, you will receive your results only by: [place] (if you have MyChart) OR A paper copy in the mail If you have any lab test that is abnormal or we need to change your treatment, we will call you to review the results.   Testing/Procedures: Your physician has requested that you have a cardiac catheterization. Cardiac catheterization is used to diagnose and/or treat various heart conditions. Doctors may recommend this procedure for a number of different reasons. The most common reason is to evaluate chest pain. Chest pain can be a symptom of coronary artery disease (CAD), and cardiac catheterization can show whether plaque is narrowing or blocking your heart's arteries. This procedure is also used to evaluate the valves, as well as measure the blood flow and oxygen levels in different parts of your heart. For further information please visit [email or web address]. Please follow instruction sheet, as given.    Follow-Up: At [facility], you and your health needs are our priority.  As part of our continuing mission to provide you with exceptional heart care, we  have created designated Provider Care Teams.  These Care Teams include your primary Cardiologist (physician) and Advanced Practice Providers (APPs -  Physician Assistants and Nurse Practitioners) who all work together to provide you with the care you need, when you need it.  We recommend signing up for the patient portal called "MyChart".  Sign up information is provided on this After Visit Summary.  MyChart is used to connect with patients for Virtual Visits (Telemedicine).  Patients are able to view lab/test results, encounter notes, upcoming appointments, etc.  Non-urgent messages can be sent to your  provider as well.   To learn more about what you can do with MyChart, go to [email or web address].    Your next appointment:   4 -6week(s)  The format for your next appointment:   In Person  Provider:   You may see Werner Lean, MD or one of the following Advanced Practice Providers on your designated Care Team:   Melina Copa, [name] Ermalinda Barrios, [name]   OTHER [place]  05/30/2021  You are scheduled for a Cardiac Catheterization on  to be determined .  1. Please arrive at the [facility] (Main Entrance A) at [facility]: [place], [place] [address] at to be determined (This time is two hours before your procedure to ensure your preparation). Free valet parking service is available.   Special note: Every effort is made to have your procedure done on time. Please understand that emergencies sometimes delay scheduled procedures.  2. Diet: Do not eat solid foods after midnight.  The patient may have clear liquids until 5am upon the day of the procedure.  3. Labs: You will need to have blood drawn on 05/31/21.  4. Medication instructions in preparation for your procedure:   Contrast Allergy: No    STOP TAKING ELIQUIS (apixiban) 2 days PRIOR TO HEART CATH    Take only 7 units of insulin the night before your procedure. Do not take any insulin on the day of the procedure.  DO NOT TAKE FUROSEMIDE ON THE DAY OF PROCEDURE  On the morning of your procedure, take your Aspirin and any morning medicines NOT listed above.  You may use sips of water.  5. Plan for one night stay--bring personal belongings. 6. Bring a current list of your medications and current insurance cards. 7. You MUST have a responsible person to drive you home. 8. Someone MUST be with you the first 24 hours after you arrive home or your discharge will be delayed. 9. Please wear clothes that are easy to get on and off and wear slip-on shoes.  Thank you for  allowing [place] to care for you!   -- [facility] Cardi[facility] services      Signed, Werner Lean, MD  05/30/2021 6:37 PM    [place]

## 2021-05-30 NOTE — Progress Notes (Signed)
Cardiology Office Note:    Date:  05/30/2021   ID:  Fernando Hess, DOB [birth year], MRN [id number]  PCP:  [name], MD   [place] [facility] Cardiologist:  Werner Lean, MD Electrophysiologist:  Vickie Epley, MD     Referring MD: [name], MD   CC: Hospital follow up  History of Present Illness:    Fernando Hess is a 75 y.o. male with a hx of  MI/CABG 09/2015 w/ LIMA-LAD, SVG-Diag, SVG-RI, SVG-OM, SVG-PDA, STEMI 10/2015 2nd occluded SVG-OM1 & SVG-PDA >> med rx, nl MV 2021, DM, HTN, HLD, GERD, CKD IIIb, OSA on CPAP, Afib on Eliquis w/ tachy/brady seen with near syncope and SOB 05/10/21.  In interim of this visit, patient had started lasix as PRN only instead of 20 mg PO daily scheduled.  Seen 05/30/21.  Patient notes that he is doing terribly.  Since medicine eval he feels tired and weak.  He has lost of energy and have blood sugars all out of control.    Needs a walker.  Is SOB and needs lasix every day. Wife notes that he isn't supposed to take more than lasix 3X a week because of prior kidney issues requiring HD classes (stage IV CKD).  Notes no chest pain (his prior anginal equivalent was neck pain).  Wife thinks he needs a heart catheterization as he hasn't had one before (we discussed prior LHC, stress testing, and risks of CIN).  Notes that he has increase fatigue with exertion couple with heart skips.  He is sending in his [place] today and is eager for the results.   Past Medical History:  Diagnosis Date   Acute renal failure (ARF) ([place]) 09/24/2015   Atrial premature complexes    CAD (coronary artery disease) of artery bypass graft    Early occlusion of saphenous vein graft to intermediate and marginal branch in February 2007 following bypass grafting    CAD (coronary artery disease), native coronary artery 2017   hx NSTEMI 09-24-2015  s/p  CABG x5 on 10-02-2015;  post op STEMI inferolateral wall,  SVG OM1 and SVG OM2 occluded, distal OM  occlusion the calpruit, treated medically // Myoview 7/21: no ischemia, EF 65, low risk   CKD (chronic kidney disease), stage III ([name])    Elevated troponin    Erectile dysfunction    Esophageal reflux    History of atrial fibrillation    post op CABG 10-02-2015   History of non-ST elevation myocardial infarction (NSTEMI) 09/24/2015   s/p  CABG x5   History of ST elevation myocardial infarction (STEMI) 10/22/2015   inferior wall,  post op CABG 10-02-2015   Hyperlipidemia    Hypertension    Left ureteral stone    Mild atherosclerosis of both carotid arteries    Nephrolithiasis    per CT bilateral non-obstructive calculi   OSA (obstructive sleep apnea)    Peripheral artery disease    LE Arterial US 01/2019: R PTA and ATA occluded; L ATA occluded   RBBB (right bundle branch block)    Renal atrophy, right    Sleep apnea    wears cpap    ST elevation myocardial infarction (STEMI) of inferior wall ([place]) 10/22/2015   Type 2 diabetes mellitus treated with insulin ([place])    followed by pcp   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 ([place]) 03/01/2008   Wears glasses     Past Surgical History:  Procedure Laterality Date   [place] N/A 09/26/2015   Procedure: Left Heart Cath and Coronary Angiography;  Surgeon: Troy Sine, MD;  Location: [facility];  Service: Cardiovascular;  Laterality: N/A;   CARDIAC CATHETERIZATION N/A 10/22/2015   Procedure: Left Heart Cath and Coronary Angiography;  Surgeon: Sherren Mocha, MD;  Location: [facility];  Service: Cardiovascular;  Laterality: N/A;   CATARACT EXTRACTION W/ INTRAOCULAR LENS  IMPLANT, BILATERAL  2017   COLONOSCOPY     CORONARY ARTERY BYPASS GRAFT N/A 10/02/2015   Procedure: CORONARY ARTERY BYPASS GRAFTING (CABG) X5 LIMA-LAD; SVG-DIAG; SVG-OM; SVG-PD; SVG-RAMUS TRANSESOPHAGEAL ECHOCARDIOGRAM (TEE) ENDOSCOPIC GREATER  SAPHENOUS VEIN  HARVEST BILAT LE;  Surgeon: Ivin Poot, MD;  Location: [facility];  Service: Open Heart Surgery;  Laterality: N/A;   CYSTOSCOPY/URETEROSCOPY/HOLMIUM LASER/STENT PLACEMENT Left 08/10/2018   Procedure: CYSTOSCOPY/URETEROSCOPY/HOLMIUM LASER/STENT PLACEMENT;  Surgeon: Festus Aloe, MD;  Location: [facility];  Service: Urology;  Laterality: Left;   CYSTOSCOPY/URETEROSCOPY/HOLMIUM LASER/STENT PLACEMENT Left 09/10/2018   Procedure: CYSTOSCOPY/URETEROSCOPY/HOLMIUM LASER/STENT EXCHANGE;  Surgeon: Festus Aloe, MD;  Location: [facility];  Service: Urology;  Laterality: Left;   LEFT HEART CATHETERIZATION WITH CORONARY ANGIOGRAM N/A 04/13/2014   Procedure: LEFT HEART CATHETERIZATION WITH CORONARY ANGIOGRAM;  Surgeon: Jacolyn Reedy, MD;  [facility] C[facility];  Service: Cardiovascular;  Laterality: N/A;   LEG SURGERY Right age 58   closed reduction leg fracture   POLYPECTOMY     TEE WITHOUT CARDIOVERSION N/A 10/02/2015   Procedure: TRANSESOPHAGEAL ECHOCARDIOGRAM (TEE);  Surgeon: Ivin Poot, MD;  Location: [facility] [place];  Service: Open Heart Surgery;  Laterality: N/A;   URETEROSCOPY WITH HOLMIUM LASER LITHOTRIPSY Bilateral 2004;  2005  dr Risa Grill  @[facility]    VASECTOMY      Current Medications: Current Meds  Medication Sig   acetaminophen (TYLENOL) 325 MG tablet Take 325-650 mg by mouth every 6 (six) hours as needed for headache (pain).   acetaminophen-codeine (TYLENOL #3) 300-30 MG tablet Take 1 tablet by mouth every 6 (six) hours as needed (pain).   albuterol (VENTOLIN HFA) 108 (90 Base) MCG/ACT inhaler as needed.   atorvastatin (LIPITOR) 40 MG tablet Take 40 mg by mouth every evening.   B-D UF III MINI PEN NEEDLES 31G X 5 MM MISC USE AS DIRECTED WITH LANTUS SOLOSTAR   Cholecalciferol (VITAMIN D3) 50 MCG (2000 UT) TABS Take 2,000 Units by mouth every evening.   DULoxetine (CYMBALTA) 30 MG capsule Take 30 mg by mouth every morning. Take with a 60 mg capsule for a total  morning dose of 90 mg   DULoxetine (CYMBALTA) 60 MG capsule Take 60 mg by mouth every morning. Take with a 30 mg capsule for a total morning dose of 90 mg   ELIQUIS 5 MG TABS tablet TAKE 1 TABLET BY MOUTH TWICE A DAY   fluticasone (FLONASE) 50 MCG/ACT nasal spray as needed.   furosemide (LASIX) 20 MG tablet Take 20 mg by mouth See admin instructions. Take one tablet (20 mg) by mouth up to three times weekly as needed for swelling/fluid.   hydrOXYzine (VISTARIL) 25 MG capsule Take 50 mg by mouth every 8 (eight) hours as needed for itching (sleep).   Insulin Glargine (BASAGLAR KWIKPEN) 100 UNIT/ML SOPN Inject 14-34 Units into the skin See admin instructions. Inject 34 units subcutaneously every morning and 14 units every evening   insulin lispro (HUMALOG) 100 UNIT/ML KwikPen Inject 4 Units into the skin daily with lunch.  MAGNESIUM PO Take 350 mg by mouth every evening. Contains blueberry powder 100  mg   Melatonin 3 MG TABS Take 3 mg by mouth daily as needed (for sleep).   Multiple Vitamin (MULTIVITAMIN WITH MINERALS) TABS tablet Take 1 tablet by mouth every morning.   nitroGLYCERIN (NITROSTAT) 0.4 MG SL tablet Place 0.4 mg under the tongue every 5 (five) minutes as needed for chest pain.   ondansetron (ZOFRAN-ODT) 4 MG disintegrating tablet Take 4 mg by mouth every 8 (eight) hours as needed for nausea or vomiting.   ONETOUCH ULTRA test strip FOR USE OF CHEACKING BLOOD ONCE DAILY   pantoprazole (PROTONIX) 40 MG tablet TAKE 1 TABLET BY MOUTH EVERY DAY   [DISCONTINUED] pioglitazone (ACTOS) 45 MG tablet Take 45 mg by mouth every morning.     Allergies:   Cilostazol, Trulicity [dulaglutide], Levaquin [levofloxacin], Lisinopril, and Victoza [liraglutide]   Social History   Socioeconomic History   Marital status: Married    Spouse name: Not on file   Number of children: Not on file   Years of education: Not on file   Highest education level: Not on file  Occupational History   Occupation:  retired  Tobacco Use   Smoking status: Never   Smokeless tobacco: Never  Vaping Use   Vaping Use: Never used  Substance and Sexual Activity   Alcohol use: Not Currently   Drug use: Never   Sexual activity: Not on file  Other Topics Concern   Not on file  Social History Narrative   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 There is no history of Colon cancer, Esophageal cancer, Prostate cancer, Rectal cancer, Stomach cancer, or Colon polyps.  ROS:   Please see the history of present illness.     All other systems reviewed and are negative.  EKGs/Labs/Other Studies Reviewed:    The following studies were reviewed today:   EKG:  EKG is  ordered today.  The ekg ordered today demonstrates  05/30/21: SR rate 90 rare PVCS (RBBB)  30-DAY MONITOR: 05/09/2021 Sinus bradycardia with minimum rate 48 BPM, pauses up to 3 seconds even during awake hours. Frequent epsiodes of atrial fibrillation with RVR (2% of the monitoring time).   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      MYOVIEW: 03/20/2020 The left ventricular ejection fraction is normal (55-65%). Nuclear stress EF: 65%. There was no ST segment deviation noted during stress. No T wave inversion was noted during stress. This is a low risk study. No significant change since prior study.   Small defect of mild severity at apex, similar to prior. Given normal wall motion, suspect apical thinning artifact. No ischemia.   ECHO: 05/10/21  1. Left  ventricular ejection fraction, by estimation, is 60 to 65%. The  left ventricle has normal function. Left ventricular endocardial border  not optimally defined to evaluate regional wall motion. There is mild left  ventricular hypertrophy. Left  ventricular diastolic parameters are consistent with Grade I diastolic  dysfunction (impaired relaxation).   2. Right ventricular systolic function is mildly reduced. The right  ventricular size is mildly enlarged. Tricuspid regurgitation signal is  inadequate for assessing PA pressure.   3. Left atrial size was mildly dilated.   4. The mitral valve is degenerative. Trivial mitral valve regurgitation.  No evidence of mitral stenosis.   5. The aortic valve is abnormal. There is moderate calcification of the  aortic valve. There is moderate thickening of the aortic valve. Aortic  valve regurgitation is trivial. Mild aortic valve stenosis. Aortic valve  area, by VTI measures 1.81 cm.  Aortic valve mean gradient measures 11.7 mmHg. Aortic valve Vmax measures  2.50 m/s.   6. The inferior vena cava is normal in size with greater than 50%  respiratory variability, suggesting right atrial pressure of 3 mmHg.    CARDIAC CATH: 10/22/2015 Ost LM to LM lesion, 50% stenosed. Mid LAD lesion, 90% stenosed. The LIMA to LAD graft is widely patent Mid RCA lesion, 50% stenosed. SVG . The saphenous vein graft to right PDA is widely patent without stenosis SVG . The saphenous vein graft to first diagonal is widely patent. This graft retrograde fills the LAD territory, with relatively brisk flow into the proximal septal perforators. Ost Ramus to Ramus lesion, 90% stenosed. Ost 1st Mrg to 1st Mrg lesion, 80% stenosed. Prox Cx lesion, 80% stenosed. 2nd Mrg lesion, 100% stenosed. SVG . This graft is occluded just beyond the aortic anastomosis This graft is occluded in the proximal body Origin to Prox Graft lesion, 100% stenosed. Prox Graft lesion, 100%  stenosed.   1. 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 S/P recent CABG with patency of the LIMA-LAD, SVG-PDA, and SVG-diagonal 3. Total occlusion of the SVG-OM1 and SVG-OM2 4. Total occlusion of the distal OM2, suspected culprit for STEMI  Recent Labs: 06/11/2020: TSH 0.815 06/25/2020: Platelets 261 05/10/2021: ALT 21; B Natriuretic Peptide 213.1; Hemoglobin 14.6; Magnesium 1.9 05/11/2021: BUN 26; Creatinine, Ser 1.91; Potassium 4.8; Sodium 139  Recent Lipid Panel    Component Value Date/Time   CHOL 129 06/11/2020 0915   TRIG 84 06/11/2020 0915   HDL 41 06/11/2020 0915   CHOLHDL 3.1 06/11/2020 0915   CHOLHDL 2.7 10/23/2015 0515   VLDL 20 10/23/2015 0515   LDLCALC 72 06/11/2020 0915     Risk Assessment/Calculations:    CHA2DS2-VASc Score = 6  {Confirm score is correct.  If not, click here to update score.  REFRESH note.  :1} This indicates a 9.7% annual risk of stroke. The patient's score is based upon: CHF History: 1 HTN History: 1 Diabetes History: 1 Stroke History: 0 Vascular Disease History: 1 Age Score: 2 Gender Score: 0          Physical Exam:    VS:  BP 122/80   Pulse 92   Ht 5\' 6"  (1.676 m)   Wt 191 lb 6.4 oz (86.8 kg)   SpO2 95%   BMI 30.89 kg/m     Wt Readings from Last 3 Encounters:  05/30/21 191 lb 6.4 oz (86.8 kg)  05/12/21 194 lb 0.1 oz (88 kg)  12/04/20 194 lb 3.2 oz (88.1 kg)     GEN:  Well nourished, well developed in no acute distress HEENT: Normal NECK: No JVD; No carotid bruits LYMPHATICS: No lymphadenopathy CARDIAC: RRR, no murmurs, rubs, gallops RESPIRATORY:  Clear to auscultation without rales, wheezing or rhonchi  ABDOMEN: Soft, non-tender, non-distended MUSCULOSKELETAL:  No edema; No deformity  SKIN: Warm and dry NEUROLOGIC:  Alert and oriented x 3 PSYCHIATRIC:  Normal affect   ASSESSMENT:    1. Chronic heart failure with preserved ejection fraction ([place])    2. Coronary artery disease involving coronary bypass graft of native heart without angina pectoris   3. Acute on chronic diastolic CHF (congestive heart failure) ([name])    PLAN:    DOE with concern for HFpEF CKD stage IIIb - BMP & Mg today - given concerns about volume status and whether or not his symptoms are cardiac, will pursue RHC for definitive answer - if creatinine is elevated hold lasix until RHC  CAD: - Had early graft closure in 2017, 1 month after CABG (SVG-OM and SVG-RI were occluded) - on Lipitor 40 mg qd - no ASA due to Eliquis   PAF- CHASDVASC 6 Near Syncope with hx of pauses (conversion and SR - follow up ZioPatch with potential loop vs. PPM (patient is not sure whom he would like to follow up from EP)   6-8 weeks f/u  Shared Decision Making/Informed Consent The risks [stroke (1 in 1000), death (1 in 1000), kidney failure [usually temporary] (1 in 500), bleeding (1 in 200), allergic reaction [possibly serious] (1 in 200)], benefits (diagnostic support and management of coronary artery disease) and alternatives of a cardiac catheterization were discussed in detail with Fernando Hess and he is willing to proceed.   Time Spent Directly with Patient:   I have spent a total of 50 minutes with the patient reviewing notes, imaging, EKGs, labs and examining the patient as well as establishing an assessment and plan that was discussed personally with the patient.  > 50% of time was spent in direct patient care and family.    Medication Adjustments/Labs and Tests Ordered: Current medicines are reviewed at length with the patient today.  Concerns regarding medicines are outlined above.  Orders Placed This Encounter  Procedures   Basic metabolic panel   Magnesium   CBC   EKG 12-Lead   No orders of the defined types were placed in this encounter.   Patient Instructions  Medication Instructions:  Your physician recommends that you continue on your current medications as  directed. Please refer to the Current Medication list given to you today.  *If you need a refill on your cardiac medications before your next appointment, please call your pharmacy*   Lab Work: TOMORROW: CBC, BMP, Mg  If you have labs (blood work) drawn today and your tests are completely normal, you will receive your results only by: [place] (if you have MyChart) OR A paper copy in the mail If you have any lab test that is abnormal or we need to change your treatment, we will call you to review the results.   Testing/Procedures: Your physician has requested that you have a cardiac catheterization. Cardiac catheterization is used to diagnose and/or treat various heart conditions. Doctors may recommend this procedure for a number of different reasons. The most common reason is to evaluate chest pain. Chest pain can be a symptom of coronary artery disease (CAD), and cardiac catheterization can show whether plaque is narrowing or blocking your heart's arteries. This procedure is also used to evaluate the valves, as well as measure the blood flow and oxygen levels in different parts of your heart. For further information please visit HugeFiesta.[email or web address]. Please follow instruction sheet, as given.    Follow-Up: At [facility], you and your health needs are our priority.  As part of our continuing mission to provide you with exceptional heart care, we have created designated Provider Care Teams.  These Care Teams include your primary Cardiologist (physician) and Advanced Practice Providers (APPs -  Physician Assistants and Nurse Practitioners) who all work together to provide you with the care you need, when you need it.  We recommend signing up for the patient portal called "MyChart".  Sign up information is provided on this After Visit Summary.  MyChart is used to connect with patients for Virtual Visits (Telemedicine).  Patients are able to view lab/test results, encounter notes, upcoming  appointments, etc.  Non-urgent messages can be sent to your provider as well.   To learn more about what you can do with MyChart, go to [email or web address].    Your next appointment:   4 -6week(s)  The format for your next appointment:   In Person  Provider:   You may see Werner Lean, MD or one of the following Advanced Practice Providers on your designated Care Team:   Melina Copa, [name] Ermalinda Barrios, [name]   OTHER [place]  05/30/2021  You are scheduled for a Cardiac Catheterization on  to be determined .  1. Please arrive at the [facility] (Main Entrance A) at [facility]: [place], [place] [address] at to be determined (This time is two hours before your procedure to ensure your preparation). Free valet parking service is available.   Special note: Every effort is made to have your procedure done on time. Please understand that emergencies sometimes delay scheduled procedures.  2. Diet: Do not eat solid foods after midnight.  The patient may have clear liquids until 5am upon the day of the procedure.  3. Labs: You will need to have blood drawn on 05/31/21.  4. Medication instructions in preparation for your procedure:   Contrast Allergy: No    STOP TAKING ELIQUIS (apixiban) 2 days PRIOR TO HEART CATH    Take only 7 units of insulin the night before your procedure. Do not take any insulin on the day of the procedure.  DO NOT TAKE FUROSEMIDE ON THE DAY OF PROCEDURE  On the morning of your procedure, take your Aspirin and any morning medicines NOT listed above.  You may use sips of water.  5. Plan for one night stay--bring personal belongings. 6. Bring a current list of your medications and current insurance cards. 7. You MUST have a responsible person to drive you home. 8. Someone MUST be with you the first 24 hours after you arrive home or your discharge will be delayed. 9. Please wear clothes that are easy to  get on and off and wear slip-on shoes.  Thank you for allowing [place] to care for you!   -- [facility] Invasive Cardiovascular services      Signed, Werner Lean, MD  05/30/2021 6:37 PM    [place]

## 2021-05-30 NOTE — Telephone Encounter (Signed)
If pt is short of breath at rest or his breathing worsens, he should go to the ED. [name], [name]    05/30/2021 9:31 AM

## 2021-05-30 NOTE — Patient Instructions (Signed)
Medication Instructions:  Your physician recommends that you continue on your current medications as directed. Please refer to the Current Medication list given to you today.  *If you need a refill on your cardiac medications before your next appointment, please call your pharmacy*   Lab Work: TOMORROW: CBC, BMP, Mg  If you have labs (blood work) drawn today and your tests are completely normal, you will receive your results only by: [place] (if you have MyChart) OR A paper copy in the mail If you have any lab test that is abnormal or we need to change your treatment, we will call you to review the results.   Testing/Procedures: Your physician has requested that you have a cardiac catheterization. Cardiac catheterization is used to diagnose and/or treat various heart conditions. Doctors may recommend this procedure for a number of different reasons. The most common reason is to evaluate chest pain. Chest pain can be a symptom of coronary artery disease (CAD), and cardiac catheterization can show whether plaque is narrowing or blocking your heart's arteries. This procedure is also used to evaluate the valves, as well as measure the blood flow and oxygen levels in different parts of your heart. For further information please visit HugeFiesta.[email or web address]. Please follow instruction sheet, as given.    Follow-Up: At [facility], you and your health needs are our priority.  As part of our continuing mission to provide you with exceptional heart care, we have created designated Provider Care Teams.  These Care Teams include your primary Cardiologist (physician) and Advanced Practice Providers (APPs -  Physician Assistants and Nurse Practitioners) who all work together to provide you with the care you need, when you need it.  We recommend signing up for the patient portal called "MyChart".  Sign up information is provided on this After Visit Summary.  MyChart is used to connect with patients for  Virtual Visits (Telemedicine).  Patients are able to view lab/test results, encounter notes, upcoming appointments, etc.  Non-urgent messages can be sent to your provider as well.   To learn more about what you can do with MyChart, go to [email or web address].    Your next appointment:   4 -6week(s)  The format for your next appointment:   In Person  Provider:   You may see Werner Lean, MD or one of the following Advanced Practice Providers on your designated Care Team:   Melina Copa, [name] Ermalinda Barrios, [name]   OTHER [place]  05/30/2021  You are scheduled for a Cardiac Catheterization on  to be determined .  1. Please arrive at the [facility] (Main Entrance A) at [facility]: [place], [place] [address] at to be determined (This time is two hours before your procedure to ensure your preparation). Free valet parking service is available.   Special note: Every effort is made to have your procedure done on time. Please understand that emergencies sometimes delay scheduled procedures.  2. Diet: Do not eat solid foods after midnight.  The patient may have clear liquids until 5am upon the day of the procedure.  3. Labs: You will need to have blood drawn on 05/31/21.  4. Medication instructions in preparation for your procedure:   Contrast Allergy: No    STOP TAKING ELIQUIS (apixiban) 2 days PRIOR TO HEART CATH    Take only 7 units of insulin the night before your procedure. Do not take any insulin on the day of the procedure.  DO  NOT TAKE FUROSEMIDE ON THE DAY OF PROCEDURE  On the morning of your procedure, take your Aspirin and any morning medicines NOT listed above.  You may use sips of water.  5. Plan for one night stay--bring personal belongings. 6. Bring a current list of your medications and current insurance cards. 7. You MUST have a responsible person to drive you home. 8. Someone MUST be with you the first 24  hours after you arrive home or your discharge will be delayed. 9. Please wear clothes that are easy to get on and off and wear slip-on shoes.  Thank you for allowing [place] to care for you!   -- [place] Invasive Cardiovascular services

## 2021-05-31 ENCOUNTER — Other Ambulatory Visit: Payer: Medicare Other | Admitting: *Deleted

## 2021-05-31 DIAGNOSIS — I5032 Chronic diastolic (congestive) heart failure: Secondary | ICD-10-CM

## 2021-05-31 NOTE — Telephone Encounter (Signed)
This pt saw Dr. [name] yesterday, on his DOD day. See OV notes from yesterday 9/15, for further plan for this pt.

## 2021-06-01 ENCOUNTER — Telehealth: Payer: Self-pay | Admitting: Internal Medicine

## 2021-06-01 DIAGNOSIS — I5033 Acute on chronic diastolic (congestive) heart failure: Secondary | ICD-10-CM

## 2021-06-01 LAB — BASIC METABOLIC PANEL
BUN/Creatinine Ratio: 21 (ref 10–24)
BUN: 44 mg/dL — ABNORMAL HIGH (ref 8–27)
CO2: 24 mmol/L (ref 20–29)
Calcium: 9.3 mg/dL (ref 8.6–10.2)
Chloride: 97 mmol/L (ref 96–106)
Creatinine, Ser: 2.07 mg/dL — ABNORMAL HIGH (ref 0.76–1.27)
Glucose: 337 mg/dL — ABNORMAL HIGH (ref 65–99)
Potassium: 4.7 mmol/L (ref 3.5–5.2)
Sodium: 136 mmol/L (ref 134–144)
eGFR: 32 mL/min/{1.73_m2} — ABNORMAL LOW (ref >59)

## 2021-06-01 LAB — CBC
Hematocrit: 45.2 % (ref 37.5–51.0)
Hemoglobin: 15.8 g/dL (ref 13.0–17.7)
MCH: 30.5 pg (ref 26.6–33.0)
MCHC: 35 g/dL (ref 31.5–35.7)
MCV: 87 fL (ref 79–97)
Platelets: 251 10*3/uL (ref 150–450)
RBC: 5.18 x10E6/uL (ref 4.14–5.80)
RDW: 13.2 % (ref 11.6–15.4)
WBC: 10.2 10*3/uL (ref 3.4–10.8)

## 2021-06-01 LAB — MAGNESIUM: Magnesium: 2 mg/dL (ref 1.6–2.3)

## 2021-06-01 MED ORDER — SODIUM CHLORIDE 0.9% FLUSH: 3.0000 mL | Freq: Two times a day (BID) | INTRAVENOUS | Status: DC

## 2021-06-01 NOTE — Telephone Encounter (Signed)
Orders placed for RHC.  Rudean Haskell, MD [place] [address], [id number] [place], [place] [address] [phone]  12:23 PM

## 2021-06-04 ENCOUNTER — Telehealth: Payer: Self-pay

## 2021-06-04 NOTE — Telephone Encounter (Addendum)
Called pt to inform him of need for IVF prior to 06/06/21 Heart cath.  I informed him that I would call him back tomorrow with more details.  I also ensured that pt is aware to stop Eliquis.  He reports that he did not take medication today.  Called pt back today to inform him that he would not need IVF prior to heart cath.  I informed pt that he would not be receiving contrast and would not need IVF.  He verbalized understanding.

## 2021-06-06 ENCOUNTER — Encounter (HOSPITAL_COMMUNITY)
Admission: RE | Disposition: A | Discharge status: Home / Self Care | Payer: Self-pay | Source: Home / Self Care | Attending: Internal Medicine

## 2021-06-06 ENCOUNTER — Ambulatory Visit (HOSPITAL_COMMUNITY)
Admission: RE | Admit: 2021-06-06 | Discharge: 2021-06-06 | Disposition: A | Discharge status: Home / Self Care | Payer: Medicare Other | Attending: Internal Medicine | Admitting: Internal Medicine

## 2021-06-06 ENCOUNTER — Other Ambulatory Visit: Payer: Self-pay

## 2021-06-06 DIAGNOSIS — Z7901 Long term (current) use of anticoagulants: Secondary | ICD-10-CM | POA: Insufficient documentation

## 2021-06-06 DIAGNOSIS — E785 Hyperlipidemia, unspecified: Secondary | ICD-10-CM | POA: Diagnosis not present

## 2021-06-06 DIAGNOSIS — I5033 Acute on chronic diastolic (congestive) heart failure: Secondary | ICD-10-CM | POA: Diagnosis not present

## 2021-06-06 DIAGNOSIS — Z888 Allergy status to other drugs, medicaments and biological substances status: Secondary | ICD-10-CM | POA: Diagnosis not present

## 2021-06-06 DIAGNOSIS — E1122 Type 2 diabetes mellitus with diabetic chronic kidney disease: Secondary | ICD-10-CM | POA: Insufficient documentation

## 2021-06-06 DIAGNOSIS — N1832 Chronic kidney disease, stage 3b: Secondary | ICD-10-CM | POA: Diagnosis not present

## 2021-06-06 DIAGNOSIS — G4733 Obstructive sleep apnea (adult) (pediatric): Secondary | ICD-10-CM | POA: Insufficient documentation

## 2021-06-06 DIAGNOSIS — Z951 Presence of aortocoronary bypass graft: Secondary | ICD-10-CM | POA: Diagnosis not present

## 2021-06-06 DIAGNOSIS — I132 Hypertensive heart and chronic kidney disease with heart failure and with stage 5 chronic kidney disease, or end stage renal disease: Secondary | ICD-10-CM | POA: Insufficient documentation

## 2021-06-06 DIAGNOSIS — I25810 Atherosclerosis of coronary artery bypass graft(s) without angina pectoris: Secondary | ICD-10-CM | POA: Diagnosis not present

## 2021-06-06 DIAGNOSIS — Z79899 Other long term (current) drug therapy: Secondary | ICD-10-CM | POA: Diagnosis not present

## 2021-06-06 DIAGNOSIS — I509 Heart failure, unspecified: Secondary | ICD-10-CM | POA: Diagnosis not present

## 2021-06-06 DIAGNOSIS — K219 Gastro-esophageal reflux disease without esophagitis: Secondary | ICD-10-CM | POA: Insufficient documentation

## 2021-06-06 DIAGNOSIS — R55 Syncope and collapse: Secondary | ICD-10-CM | POA: Diagnosis not present

## 2021-06-06 DIAGNOSIS — Z794 Long term (current) use of insulin: Secondary | ICD-10-CM | POA: Diagnosis not present

## 2021-06-06 DIAGNOSIS — Z881 Allergy status to other antibiotic agents status: Secondary | ICD-10-CM | POA: Diagnosis not present

## 2021-06-06 DIAGNOSIS — I480 Paroxysmal atrial fibrillation: Secondary | ICD-10-CM | POA: Insufficient documentation

## 2021-06-06 HISTORY — PX: RIGHT HEART CATH: CATH118263

## 2021-06-06 LAB — POCT I-STAT EG7
Acid-Base Excess: 0 mmol/L (ref 0.0–2.0)
Acid-base deficit: 1 mmol/L (ref 0.0–2.0)
Bicarbonate: 24.8 mmol/L (ref 20.0–28.0)
Bicarbonate: 26.1 mmol/L (ref 20.0–28.0)
Calcium, Ion: 1.12 mmol/L — ABNORMAL LOW (ref 1.15–1.40)
Calcium, Ion: 1.25 mmol/L (ref 1.15–1.40)
HCT: 43 % (ref 39.0–52.0)
HCT: 45 % (ref 39.0–52.0)
Hemoglobin: 14.6 g/dL (ref 13.0–17.0)
Hemoglobin: 15.3 g/dL (ref 13.0–17.0)
O2 Saturation: 71 %
O2 Saturation: 67 %
Potassium: 4.1 mmol/L (ref 3.5–5.1)
Potassium: 4.6 mmol/L (ref 3.5–5.1)
Sodium: 140 mmol/L (ref 135–145)
Sodium: 138 mmol/L (ref 135–145)
TCO2: 26 mmol/L (ref 22–32)
TCO2: 28 mmol/L (ref 22–32)
pCO2, Ven: 42.9 mmHg — ABNORMAL LOW (ref 44.0–60.0)
pCO2, Ven: 45.6 mmHg (ref 44.0–60.0)
pH, Ven: 7.369 (ref 7.250–7.430)
pH, Ven: 7.366 (ref 7.250–7.430)
pO2, Ven: 38 mmHg (ref 32.0–45.0)
pO2, Ven: 37 mmHg (ref 32.0–45.0)

## 2021-06-06 LAB — GLUCOSE, CAPILLARY: Glucose-Capillary: 294 mg/dL — ABNORMAL HIGH (ref 70–99)

## 2021-06-06 SURGERY — RIGHT HEART CATH
Anesthesia: LOCAL

## 2021-06-06 MED ORDER — HEPARIN (PORCINE) IN NACL 1000-0.9 UT/500ML-% IV SOLN
INTRAVENOUS | Status: AC
  Filled 2021-06-06: qty 1000

## 2021-06-06 MED ORDER — SODIUM CHLORIDE 0.9 % IV SOLN: 250.0000 mL | INTRAVENOUS | Status: DC | PRN

## 2021-06-06 MED ORDER — LIDOCAINE HCL (PF) 1 % IJ SOLN
INTRAMUSCULAR | Status: AC
  Filled 2021-06-06: qty 30

## 2021-06-06 MED ORDER — HYDRALAZINE HCL 20 MG/ML IJ SOLN: 10.0000 mg | INTRAMUSCULAR | Status: DC | PRN

## 2021-06-06 MED ORDER — ONDANSETRON HCL 4 MG/2ML IJ SOLN: 4.0000 mg | Freq: Four times a day (QID) | INTRAMUSCULAR | Status: DC | PRN

## 2021-06-06 MED ORDER — LABETALOL HCL 5 MG/ML IV SOLN: 10.0000 mg | INTRAVENOUS | Status: DC | PRN

## 2021-06-06 MED ORDER — SODIUM CHLORIDE 0.9% FLUSH: 3.0000 mL | INTRAVENOUS | Status: DC | PRN

## 2021-06-06 MED ORDER — ACETAMINOPHEN 325 MG PO TABS: 650.0000 mg | ORAL_TABLET | ORAL | Status: DC | PRN

## 2021-06-06 MED ORDER — HEPARIN (PORCINE) IN NACL 1000-0.9 UT/500ML-% IV SOLN
INTRAVENOUS | Status: DC | PRN
  Administered 2021-06-06: 500 mL

## 2021-06-06 MED ORDER — SODIUM CHLORIDE 0.9 % IV SOLN: INTRAVENOUS | Status: DC

## 2021-06-06 MED ORDER — LIDOCAINE HCL (PF) 1 % IJ SOLN
INTRAMUSCULAR | Status: DC | PRN
  Administered 2021-06-06: 2 mL

## 2021-06-06 MED ORDER — SODIUM CHLORIDE 0.9% FLUSH: 3.0000 mL | Freq: Two times a day (BID) | INTRAVENOUS | Status: DC

## 2021-06-06 SURGICAL SUPPLY — 6 items
CATH BALLN WEDGE 5F 110CM (CATHETERS) ×1 IMPLANT
PACK CARDIAC CATHETERIZATION (CUSTOM PROCEDURE TRAY) ×2 IMPLANT
SHEATH GLIDE SLENDER 4/5FR (SHEATH) ×1 IMPLANT
TRANSDUCER W/STOPCOCK (MISCELLANEOUS) ×2 IMPLANT
TUBING ART PRESS 72  MALE/FEM (TUBING) ×2
TUBING ART PRESS 72 MALE/FEM (TUBING) IMPLANT

## 2021-06-06 NOTE — Interval H&P Note (Signed)
History and Physical Interval Note:  06/06/2021 9:09 AM  Fernando Hess  has presented today for surgery, with the diagnosis of heart failure.  The various methods of treatment have been discussed with the patient and family. After consideration of risks, benefits and other options for treatment, the patient has consented to  Procedure(s): RIGHT HEART CATH (N/A) as a surgical intervention.  The patient's history has been reviewed, patient examined, no change in status, stable for surgery.  I have reviewed the patient's chart and labs.  Questions were answered to the patient's satisfaction.     Fernando Hess

## 2021-06-06 NOTE — Discharge Instructions (Signed)
 Site Care  This sheet gives you information about how to care for yourself after your procedure. Your health care provider may also give you more specific instructions. If you have problems or questions, contact your health care provider. What can I expect after the procedure? After the procedure, it is common to have: Bruising and tenderness at the catheter insertion area. Follow these instructions at home: Medicines Take over-the-counter and prescription medicines only as told by your health care provider. Insertion site care Follow instructions from your health care provider about how to take care of your insertion site. Make sure you: Wash your hands with soap and water before you remove your bandage (dressing). If soap and water are not available, use hand sanitizer. May remove dressing in 24 hours. Check your insertion site every day for signs of infection. Check for: Redness, swelling, or pain. Fluid or blood. Pus or a bad smell. Warmth. Do no take baths, swim, or use a hot tub for 5 days. You may shower 24-48 hours after the procedure. Remove the dressing and gently wash the site with plain soap and water. Pat the area dry with a clean towel. Do not rub the site. That could cause bleeding. Do not apply powder or lotion to the site. Activity  For 24 hours after the procedure, or as directed by your health care provider: Do not flex or bend the affected arm. Do not push or pull heavy objects with the affected arm. Do not drive yourself home from the hospital or clinic. You may drive 24 hours after the procedure. Do not operate machinery or power tools. KEEP ARM ELEVATED THE REMAINDER OF THE DAY. Do not push, pull or lift anything that is heavier than 10 lb for 5 days. Ask your health care provider when it is okay to: Return to work or school. Resume usual physical activities or sports. Resume sexual activity. General instructions If the catheter site starts to bleed,  raise your arm and put firm pressure on the site. If the bleeding does not stop, get help right away. This is a medical emergency. DRINK PLENTY OF FLUIDS FOR THE NEXT 2-3 DAYS. No alcohol consumption for 24 hours after receiving sedation. If you went home on the same day as your procedure, a responsible adult should be with you for the first 24 hours after you arrive home. Keep all follow-up visits as told by your health care provider. This is important. Contact a health care provider if: You have a fever. You have redness, swelling, or yellow drainage around your insertion site. Get help right away if: You have unusual pain at the radial site. The catheter insertion area swells very fast. The insertion area is bleeding, and the bleeding does not stop when you hold steady pressure on the area. Your arm or hand becomes pale, cool, tingly, or numb. These symptoms may represent a serious problem that is an emergency. Do not wait to see if the symptoms will go away. Get medical help right away. Call your local emergency services (911 in the [place] Do not drive yourself to the hospital. Summary After the procedure, it is common to have bruising and tenderness at the site. Follow instructions from your health care provider about how to take care of your radial site wound. Check the wound every day for signs of infection.  This information is not intended to replace advice given to you by your health care provider. Make sure you discuss any questions you have with   your health care provider. Document Revised: 10/07/2017 Document Reviewed: 10/07/2017 Elsevier Patient Education  2020 Elsevier Inc.  

## 2021-06-06 NOTE — Progress Notes (Signed)
Patient and wife was given discharge instructions. Both verbalized understanding. 

## 2021-06-07 ENCOUNTER — Encounter (HOSPITAL_COMMUNITY): Payer: Self-pay | Admitting: Internal Medicine

## 2021-06-07 ENCOUNTER — Telehealth: Payer: Self-pay | Admitting: Internal Medicine

## 2021-06-07 NOTE — Telephone Encounter (Signed)
I spoke with patient's wife who is asking about lasix dosage.  I told her no changes were made after procedure yesterday and he should continue as previously ordered.

## 2021-06-07 NOTE — Telephone Encounter (Signed)
Pt c/o medication issue:  1. Name of Medication: furosemide (LASIX) 20 MG tablet  2. How are you currently taking this medication (dosage and times per day)?   3. Are you having a reaction (difficulty breathing--STAT)?   4. What is your medication issue? Wife of the patient wanted further clarification as to how the patient is supposed to take this medication.  The patient had a heart cath yesterday and there was some fluid on his heart. The wife is not sure what to do.

## 2021-06-10 ENCOUNTER — Telehealth: Payer: Self-pay

## 2021-06-10 NOTE — Telephone Encounter (Signed)
Called pt and reviewed MD recommendation to decrease frequency of furosemide to 3 times per week.  I also informed pt that MD feels that SOB is likely not coming from his heart.  I advised him to f/u with PCP.

## 2021-06-10 NOTE — Telephone Encounter (Signed)
-----   Message from Werner Lean, MD sent at 06/10/2021  7:29 AM EDT ----- Presently [name] is taking lasix daily, we can back of to his original three times a week.  His SOB does not appear to be related to his heart; backing off should help his kidney function.  Thanks, MAC    ----- Message ----- From: Jolaine Artist, MD Sent: 06/06/2021   5:39 PM EDT To: Werner Lean, MD  You got him nice and dry! Good job.

## 2021-06-17 NOTE — Progress Notes (Signed)
06/18/21- 75 yoM never smoker for sleep evaluation courtesy of Dr Lamonte Sakai. Medical problem list includes CAD/ MI/ RBBB/ CABG, , PAD, HTN, dCHF, OSA, DM2/ retinopathy, CKD3,  Epworth score- Body weight today-     188 lbs                  Wife here                 Covid vax- Flu vax- NPSG ([facility]) 11/25/18- AHI 37.9/ hr, desaturation to 79%, body weight 201 lbs CPAP auto 5-15/ [employer] Last [facility] contact for OSA- phone report of CPAP compliance 08/28/20- -----Referred by PCP for history of OSA. States that he uses his CPAP "sometimes"- about 2 days/ week. Complains of "trouble breathing" and gets smothered sometimes at night so takes CPAP off. Scant dry cough. Not aware of lung disease. Easily DOE, hills and stairs. No acute change. Walk Test Room Air 06/18/21- 3 Steady laps- lowest O2 sat 95%, max HR 103.  Prior to Admission medications   Medication Sig Start Date End Date Taking? Authorizing Provider  acetaminophen (TYLENOL) 325 MG tablet Take 325-650 mg by mouth every 6 (six) hours as needed for headache (pain).   Yes [provider]  acetaminophen-codeine (TYLENOL #3) 300-30 MG tablet Take 1 tablet by mouth every 6 (six) hours as needed (pain). 01/28/21  Yes [provider]  albuterol (VENTOLIN HFA) 108 (90 Base) MCG/ACT inhaler as needed. 05/13/21  Yes [provider]  atorvastatin (LIPITOR) 40 MG tablet Take 40 mg by mouth every evening.   Yes [provider]  B-D UF III MINI PEN NEEDLES 31G X 5 MM MISC USE AS DIRECTED WITH LANTUS SOLOSTAR 04/02/19  Yes [provider]  Cholecalciferol (VITAMIN D3) 50 MCG (2000 UT) TABS Take 2,000 Units by mouth every evening.   Yes [provider]  DULoxetine (CYMBALTA) 30 MG capsule Take 30 mg by mouth every morning. Take with a 60 mg capsule for a total morning dose of 90 mg   Yes [provider]  DULoxetine (CYMBALTA) 60 MG capsule Take 60 mg by mouth every morning. Take with a 30 mg capsule for a total  morning dose of 90 mg 04/12/18  Yes [provider]  ELIQUIS 5 MG TABS tablet TAKE 1 TABLET BY MOUTH TWICE A DAY 03/27/21  Yes Weaver, Scott T, [name]  fluticasone (FLONASE) 50 MCG/ACT nasal spray as needed. 05/13/21  Yes [provider]  furosemide (LASIX) 20 MG tablet Take 20 mg by mouth See admin instructions. Take one tablet (20 mg) by mouth up to three times weekly as needed for swelling/fluid.   Yes [provider]  hydrOXYzine (VISTARIL) 25 MG capsule Take 50 mg by mouth every 8 (eight) hours as needed for itching (sleep). 01/04/21  Yes [provider]  Insulin Glargine (BASAGLAR KWIKPEN) 100 UNIT/ML SOPN Inject 14-34 Units into the skin See admin instructions. Inject 34 units subcutaneously every morning and 14 units every evening   Yes [provider]  insulin lispro (HUMALOG) 100 UNIT/ML KwikPen Inject 4 Units into the skin daily with lunch.   Yes [provider]  MAGNESIUM PO Take 350 mg by mouth every evening. Contains blueberry powder 100  mg   Yes [provider]  Melatonin 3 MG TABS Take 3 mg by mouth daily as needed (for sleep).   Yes [provider]  Multiple Vitamin (MULTIVITAMIN WITH MINERALS) TABS tablet Take 1 tablet by mouth every morning.  Yes [provider]  nitroGLYCERIN (NITROSTAT) 0.4 MG SL tablet Place 0.4 mg under the tongue every 5 (five) minutes as needed for chest pain.   Yes [provider]  ondansetron (ZOFRAN-ODT) 4 MG disintegrating tablet Take 4 mg by mouth every 8 (eight) hours as needed for nausea or vomiting.   Yes [provider]  [place] test strip FOR USE OF [place] BLOOD ONCE DAILY 03/18/19  Yes [provider]  pantoprazole (PROTONIX) 40 MG tablet TAKE 1 TABLET BY MOUTH EVERY DAY 11/25/19  Yes [name], MD  zaleplon ([name]) 5 MG capsule 1 at bedtime as needed for sleep 06/18/21  Yes Baird Lyons D, MD   Past Medical History:  Diagnosis Date    Acute renal failure (ARF) ([place]) 09/24/2015   Atrial premature complexes    CAD (coronary artery disease) of artery bypass graft    Early occlusion of saphenous vein graft to intermediate and marginal branch in February 2007 following bypass grafting    CAD (coronary artery disease), native coronary artery 2017   hx NSTEMI 09-24-2015  s/p  CABG x5 on 10-02-2015;  post op STEMI inferolateral wall,  SVG OM1 and SVG OM2 occluded, distal OM occlusion the calpruit, treated medically // Myoview 7/21: no ischemia, EF 65, low risk   CKD (chronic kidney disease), stage III ([name])    Elevated troponin    Erectile dysfunction    Esophageal reflux    History of atrial fibrillation    post op CABG 10-02-2015   History of non-ST elevation myocardial infarction (NSTEMI) 09/24/2015   s/p  CABG x5   History of ST elevation myocardial infarction (STEMI) 10/22/2015   inferior wall,  post op CABG 10-02-2015   Hyperlipidemia    Hypertension    Left ureteral stone    Mild atherosclerosis of both carotid arteries    Nephrolithiasis    per CT bilateral non-obstructive calculi   OSA (obstructive sleep apnea)    Peripheral artery disease    LE Arterial US 01/2019: R PTA and ATA occluded; L ATA occluded   RBBB (right bundle branch block)    Renal atrophy, right    Sleep apnea    wears cpap    ST elevation myocardial infarction (STEMI) of inferior wall ([place]) 10/22/2015   Type 2 diabetes mellitus treated with insulin ([place])    followed by pcp   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 ([place]) 03/01/2008   Wears glasses    Past Surgical History:  Procedure Laterality Date   [place] N/A 09/26/2015   Procedure: Left Heart Cath and Coronary Angiography;  Surgeon: Troy Sine, MD;  Location: [facility];  Service: Cardiovascular;  Laterality: N/A;   CARDIAC CATHETERIZATION N/A 10/22/2015   Procedure:  Left Heart Cath and Coronary Angiography;  Surgeon: Sherren Mocha, MD;  Location: [facility];  Service: Cardiovascular;  Laterality: N/A;   CATARACT EXTRACTION W/ INTRAOCULAR LENS  IMPLANT, BILATERAL  2017   COLONOSCOPY     CORONARY ARTERY BYPASS GRAFT N/A 10/02/2015   Procedure: CORONARY ARTERY BYPASS GRAFTING (CABG) X5 LIMA-LAD; SVG-DIAG; SVG-OM; SVG-PD; SVG-RAMUS TRANSESOPHAGEAL ECHOCARDIOGRAM (TEE) ENDOSCOPIC GREATER SAPHENOUS VEIN  HARVEST BILAT LE;  Surgeon: Ivin Poot, MD;  Location: [facility];  Service: Open Heart Surgery;  Laterality: N/A;   CYSTOSCOPY/URETEROSCOPY/HOLMIUM LASER/STENT PLACEMENT Left 08/10/2018   Procedure: CYSTOSCOPY/URETEROSCOPY/HOLMIUM LASER/STENT PLACEMENT;  Surgeon: Festus Aloe, MD;  Location: [facility];  Service: Urology;  Laterality: Left;   CYSTOSCOPY/URETEROSCOPY/HOLMIUM LASER/STENT PLACEMENT Left 09/10/2018   Procedure: CYSTOSCOPY/URETEROSCOPY/HOLMIUM LASER/STENT EXCHANGE;  Surgeon: Festus Aloe, MD;  Location: [facility];  Service: Urology;  Laterality: Left;   LEFT HEART CATHETERIZATION WITH CORONARY ANGIOGRAM N/A 04/13/2014   Procedure: LEFT HEART CATHETERIZATION WITH CORONARY ANGIOGRAM;  Surgeon: Jacolyn Reedy, MD;  Location: [facility];  Service: Cardiovascular;  Laterality: N/A;   LEG SURGERY Right age 63   closed reduction leg fracture   POLYPECTOMY     RIGHT HEART CATH N/A 06/06/2021   Procedure: RIGHT HEART CATH;  Surgeon: Jolaine Artist, MD;  Location: [facility];  Service: Cardiovascular;  Laterality: N/A;   TEE WITHOUT CARDIOVERSION N/A 10/02/2015   Procedure: TRANSESOPHAGEAL ECHOCARDIOGRAM (TEE);  Surgeon: Ivin Poot, MD;  Location: [facility];  Service: Open Heart Surgery;  Laterality: N/A;   URETEROSCOPY WITH HOLMIUM LASER LITHOTRIPSY Bilateral 2004;  2005  dr grapey  @[facility]    VASECTOMY     .famh Social History   Socioeconomic History   Marital status: Married    Spouse name: Not on file   Number  of children: Not on file   Years of education: Not on file   Highest education level: Not on file  Occupational History   Occupation: retired  Tobacco Use   Smoking status: Never   Smokeless tobacco: Never  Vaping Use   Vaping Use: Never used  Substance and Sexual Activity   Alcohol use: Not Currently   Drug use: Never   Sexual activity: Not on file  Other Topics Concern   Not on file  Social History Narrative   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 HEENT:    headaches, difficulty swallowing, tooth/dental problems, sore throat,       sneezing, itching, ear ache, nasal congestion, post nasal drip, snoring CV:    chest pain, orthopnea, PND, swelling in lower extremities, anasarca,                                 dizziness, palpitations Resp:   +shortness of breath with exertion or at rest.                productive cough,   non-productive cough, coughing up of blood.              change in color of mucus.  wheezing.   Skin:    rash or lesions. GI:  No-   heartburn, indigestion, abdominal pain, nausea, vomiting, diarrhea,                 change in bowel habits, loss of appetite GU: dysuria, change in color of urine, no urgency or frequency.   flank pain. MS:   joint pain, stiffness, decreased range of motion, back pain. Neuro-     nothing unusual Psych:  change in mood or affect.  depression or anxiety.   memory loss.  OBJ- Physical Exam General- Alert, Oriented, Affect-appropriate, Distress- none acute Skin- rash-none, lesions- none, excoriation- none Lymphadenopathy-  none Head- atraumatic            Eyes- Gross vision intact, PERRLA, conjunctivae and secretions clear            Ears- Hearing, canals-normal            Nose- Clear, no-Septal dev, mucus, polyps, erosion, perforation             Throat- Mallampati III , mucosa clear , drainage- none, tonsils- atrophic,  +teeth/ some repair Neck- flexible , trachea midline, no stridor , thyroid nl, carotid no bruit Chest - symmetrical excursion , unlabored           Heart/CV- RRR , no murmur , no gallop  , no rub, nl s1 s2                           - JVD- none , edema- none, stasis changes- none, varices- none           Lung- clear to P&A, wheeze- none, cough- none , dullness-none, rub- none           Chest wall-  Abd-  Br/ Gen/ Rectal- Not done, not indicated Extrem- cyanosis- none, clubbing, none, atrophy- none, strength- nl Neuro- grossly intact to observation

## 2021-06-18 ENCOUNTER — Encounter: Payer: Self-pay | Admitting: Internal Medicine

## 2021-06-18 ENCOUNTER — Other Ambulatory Visit: Payer: Self-pay

## 2021-06-18 ENCOUNTER — Ambulatory Visit (INDEPENDENT_AMBULATORY_CARE_PROVIDER_SITE_OTHER): Payer: Medicare Other | Admitting: Internal Medicine

## 2021-06-18 ENCOUNTER — Ambulatory Visit: Payer: Medicare Other

## 2021-06-18 VITALS — BP 118/74 | HR 82 | Ht 66.0 in | Wt 188.8 lb

## 2021-06-18 DIAGNOSIS — Z9989 Dependence on other enabling machines and devices: Secondary | ICD-10-CM

## 2021-06-18 DIAGNOSIS — Z23 Encounter for immunization: Secondary | ICD-10-CM

## 2021-06-18 DIAGNOSIS — G4733 Obstructive sleep apnea (adult) (pediatric): Secondary | ICD-10-CM | POA: Diagnosis not present

## 2021-06-18 DIAGNOSIS — R0602 Shortness of breath: Secondary | ICD-10-CM | POA: Diagnosis not present

## 2021-06-18 MED ORDER — ZALEPLON 5 MG PO CAPS: ORAL_CAPSULE | ORAL | 3 refills | Status: DC

## 2021-06-18 NOTE — Assessment & Plan Note (Signed)
Not clear if his nocturnal dyspnea is due to CPAP or his known CHF. Plan- educated with wife present on CPAP goals. Continue auto 5-15. ONOX on CPAP.

## 2021-06-18 NOTE — Patient Instructions (Addendum)
Order- schedule PFT   dx Dyspnea, CHF  Order- O2 qualifying walk test   dx CHF  Script sent for [name] - try one at bedtime as needed for insomnia  See if you can use your CPAP more- try for at least 4 hours/ night. It will help your heart.

## 2021-06-18 NOTE — Assessment & Plan Note (Addendum)
Probably reflects known cardiac status.  Plan- overnight oximetry on CPAP, schedule PFT

## 2021-06-25 ENCOUNTER — Other Ambulatory Visit: Payer: Self-pay

## 2021-06-25 ENCOUNTER — Encounter (INDEPENDENT_AMBULATORY_CARE_PROVIDER_SITE_OTHER): Payer: Self-pay | Admitting: Ophthalmology

## 2021-06-25 ENCOUNTER — Ambulatory Visit (INDEPENDENT_AMBULATORY_CARE_PROVIDER_SITE_OTHER): Payer: Medicare Other | Admitting: Ophthalmology

## 2021-06-25 DIAGNOSIS — E113312 Type 2 diabetes mellitus with moderate nonproliferative diabetic retinopathy with macular edema, left eye: Secondary | ICD-10-CM

## 2021-06-25 DIAGNOSIS — E113311 Type 2 diabetes mellitus with moderate nonproliferative diabetic retinopathy with macular edema, right eye: Secondary | ICD-10-CM

## 2021-06-25 DIAGNOSIS — Z9989 Dependence on other enabling machines and devices: Secondary | ICD-10-CM | POA: Diagnosis not present

## 2021-06-25 DIAGNOSIS — G4733 Obstructive sleep apnea (adult) (pediatric): Secondary | ICD-10-CM | POA: Diagnosis not present

## 2021-06-25 MED ORDER — FLUORESCEIN SODIUM 10 % IV SOLN
500.0000 mg | INTRAVENOUS | Status: AC | PRN
  Administered 2021-06-25: 500 mg via INTRAVENOUS

## 2021-06-25 NOTE — Assessment & Plan Note (Signed)
OD, very minor CSME by OCT as well as by FFA.  No signs of MAC-TEL related issues.  Patient continues on CPAP use  CSME will not need specific therapy today

## 2021-06-25 NOTE — Assessment & Plan Note (Signed)
Doing well with CPAP use.

## 2021-06-25 NOTE — Assessment & Plan Note (Signed)
OS, with also only mild CSME Not center of the macula threatening we will thus continue to observe

## 2021-06-25 NOTE — Progress Notes (Signed)
Cardiology Office Note:    Date:  06/26/2021   ID:  HILLIS MCPHATTER, DOB 01/20/1946, MRN [id number]  PCP:  [name], MD   [facility] [facility] Cardiologist:  Freada Bergeron, MD Cardiology APP:  Liliane Shi, [name]  Electrophysiologist:  Vickie Epley, MD     Referring MD: [name], MD   Chief Complaint:  F/u CAD, CHF    Patient Profile:   Fernando Hess is a 75 y.o. male with:  Coronary artery disease S/p MI in 09/2015 >> s/p CABG S/p inferolateral STEMI 2/17 >> S-OM1 and S-OM2 occluded; dOM occluded >> med Rx Myoview 10/2018: no isch, EF 52, apical thinning  Myoview 7/21: low risk  Heart failure with preserved ejection fraction  admx in 11/2019 Paroxysmal atrial fibrillation CHA2DS2-VASc Score = 6 >> Apixaban   Tachy-brady syndrome (eval by Dr. [name] with EP in 11/21)   Mild aortic stenosis (echo 8/22: Mean 11.7) Diabetes mellitus Hypertension Hyperlipidemia Chronic kidney disease stage IV Obstructive sleep apnea Peripheral arterial disease eval by Dr. Gwenlyn Found 7.2020; bilat tibial disease >> Med Rx (Pletal) >> DC'd with CHF   Prior CV studies: Cardiac monitor 06/07/21 Patient had a minimum heart rate of 43 bpm, maximum heart rate of 171 bpm (during SVT), and average heart rate of 76 bpm. Predominant underlying rhythm was sinus rhythm. Frequent short runs of supraventricular tachycardia occurred lasting 45 seconds at longest with a max rate of 171 bpm at fastest (most much slower). Isolated PACs were frequent (7.3%). Isolated PVCs were rare (<1.0%). No triggered and diary events. Frequent, asymptomatic PACs and short runs of SVT  R cardiac catheterization 06/06/21 RA = 2 RV = 32/5 PA = 31/11 (19) PCW = 4 Fick cardiac output/index = 4.1/2.1 PVR = 3.6 WU FA sat = 98% PA sat = 68%, 70% PAPi = 10 Assessment: 1. Low filling pressures with moderately reduce CO  Echocardiogram 05/10/21 EF 60-65, mild LVH, GR 1 DD, mildly reduced RVSF,  mild LAE, trivial MR, moderate AV calcification, mild AS (mean 11.7, V-max 250 cm/s)  Carotid US 06/21/20 Bilat ICA 1-39   Event monitor 04/2020 Sinus bradycardia with minimum rate 48 BPM, pauses up to 3 seconds even during awake hours. Frequent epsiodes of atrial fibrillation with RVR (2% of the monitoring time).  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    Myoview 03/20/20 EF 65, apical thinning artifact, no ischemia, low risk    Echocardiogram 11/19/2019 EF 60-65, moderate LVH, GR 1 DD, no RWMA, mild AI, mild MR, mildly dilated ascending aorta (39 mm)   LE arterial US 02/11/2019 Right PTA and ATA occluded; left ATA occluded   Myoview 10/21/2018 Low risk, mildly abnormal stress nuclear study with apical thinning but no ischemia; EF 52 with mild global hypokinesis    Holter 06/11/18 Very frequent supraventricular ectopy, contributing to 15% of overall beats. Very frequent couplets and a very short runs of atrial tachycardia.   Echocardiogram 10/23/2015 EF 55-60, normal wall motion, mildly calcified aortic valve leaflets, mild AI, mild to moderate MR   Cardiac catheterization 10/22/2015 LM ostial 50 LAD mid 90 RI ostial 90 LCx proximal 80; OM1 80; OM2 100 (culprit for MI) RCA mid 50 LIMA-LAD patent SVG-RPDA patent SVG-D1 patent SVG-OM1 100 SVG-OM2 100 Medical therapy   Pre-CABG Dopplers 09/28/2015 Summary: Findings suggest 1-39% internal carotid artery stenosis bilaterally. Vertebral arteries are patent with antegrade flow. Bilateral ABIs are within normal limits.  History of Present Illness: [name] was admitted 8/26-8/28 with episodes of syncope and shortness of breath.  Echocardiogram demonstrated normal EF.  His BNP was mildly elevated and he was diuresed with IV furosemide.  PRN furosemide was recommended at DC.  His beta-blocker was DC'd.  He had orthostatic hypotension noted.  He saw Dr. [name] after  DC and was set up for RHC due to ongoing shortness of breath and weakness.  RHC showed low filling pressures.  He was asked to reduce furosemide use (he had been taking it daily prior to [place]) to 3 times a week.  An OP event monitor showed frequent asymptomatic PACs and short runs of Supraventricular Tachycardia (no AF or pauses).  A f/u with EP is scheduled 11/3 (Dr. [name]) given hx of tachy-brady syndrome.    He returns for f/u.  He is here today with his wife.  He continues to note significant issues with shortness of breath.  He demonstrates his shortness of breath for me when I walk in the room and he seems to be describing more nasal congestion than anything.  He did see pulmonology recently.  He has pulmonary function testing pending.  The patient notes that he is wearing CPAP nightly.  He says that he is always short of breath.  It is worse with exertion.  He and his wife both note that his breathing is better on the days he takes furosemide.  He has not had orthopnea, leg edema, chest pain.  He has not had any further syncope.    Past Medical History:  Diagnosis Date   Acute renal failure (ARF) ([place]) 09/24/2015   Atrial premature complexes    CAD (coronary artery disease) of artery bypass graft    Early occlusion of saphenous vein graft to intermediate and marginal branch in February 2007 following bypass grafting    CAD (coronary artery disease), native coronary artery 2017   hx NSTEMI 09-24-2015  s/p  CABG x5 on 10-02-2015;  post op STEMI inferolateral wall,  SVG OM1 and SVG OM2 occluded, distal OM occlusion the calpruit, treated medically // Myoview 7/21: no ischemia, EF 65, low risk   CKD (chronic kidney disease), stage III ([name])    Elevated troponin    Erectile dysfunction    Esophageal reflux    History of atrial fibrillation    post op CABG 10-02-2015   History of non-ST elevation myocardial infarction (NSTEMI) 09/24/2015   s/p  CABG x5   History of ST elevation myocardial  infarction (STEMI) 10/22/2015   inferior wall,  post op CABG 10-02-2015   Hyperlipidemia    Hypertension    Left ureteral stone    Mild atherosclerosis of both carotid arteries    Nephrolithiasis    per CT bilateral non-obstructive calculi   OSA (obstructive sleep apnea)    Peripheral artery disease    LE Arterial US 01/2019: R PTA and ATA occluded; L ATA occluded   RBBB (right bundle branch block)    Renal atrophy, right    Sleep apnea    wears cpap    ST elevation myocardial infarction (STEMI) of inferior wall ([place]) 10/22/2015   Type 2 diabetes mellitus treated with insulin ([place])    followed by pcp   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   acetaminophen-codeine (TYLENOL #3) 300-30 MG tablet Take 1 tablet by mouth every 6 (six) hours as needed (pain).   albuterol (VENTOLIN HFA) 108 (90 Base) MCG/ACT inhaler as needed.   atorvastatin (LIPITOR) 40 MG tablet Take 40 mg by mouth every evening.   B-D UF III MINI PEN NEEDLES 31G X 5 MM MISC USE AS DIRECTED WITH LANTUS SOLOSTAR   Cholecalciferol (VITAMIN D3) 50 MCG (2000 UT) TABS Take 2,000 Units by mouth every evening.   DULoxetine (CYMBALTA) 30 MG capsule Take 30 mg by mouth every morning. Take with a 60 mg capsule for a total morning dose of 90 mg   DULoxetine (CYMBALTA) 60 MG capsule Take 60 mg by mouth every morning. Take with a 30 mg capsule for a total morning dose of 90 mg   ELIQUIS 5 MG TABS tablet TAKE 1 TABLET BY MOUTH TWICE A DAY   fluticasone (FLONASE) 50 MCG/ACT nasal spray as needed.   furosemide (LASIX) 20 MG tablet Take 20 mg by mouth See admin instructions. Take one tablet (20 mg) by mouth up to three times weekly as needed for swelling/fluid.    hydrOXYzine (VISTARIL) 25 MG capsule Take 50 mg by mouth every 8 (eight) hours as needed for itching (sleep).   Insulin Glargine (BASAGLAR KWIKPEN) 100 UNIT/ML SOPN Inject 34 Units into the skin See admin instructions. Inject 34 units subcutaneously every morning   insulin lispro (HUMALOG) 100 UNIT/ML KwikPen Inject 10 Units into the skin 3 (three) times daily.   MAGNESIUM PO Take 350 mg by mouth every evening. Contains blueberry powder 100  mg   Melatonin 3 MG TABS Take 3 mg by mouth daily as needed (for sleep).   Multiple Vitamin (MULTIVITAMIN WITH MINERALS) TABS tablet Take 1 tablet by mouth every morning.   nitroGLYCERIN (NITROSTAT) 0.4 MG SL tablet Place 0.4 mg under the tongue every 5 (five) minutes as needed for chest pain.   ondansetron (ZOFRAN-ODT) 4 MG disintegrating tablet Take 4 mg by mouth every 8 (eight) hours as needed for nausea or vomiting.   ONETOUCH ULTRA test strip FOR USE OF CHEACKING BLOOD ONCE DAILY   pantoprazole (PROTONIX) 40 MG tablet TAKE 1 TABLET BY MOUTH EVERY DAY   zaleplon ([name]) 5 MG capsule 1 at bedtime as needed for sleep   Current Facility-Administered Medications for the 06/26/21 encounter (Office Visit) with [name], [name]  Medication   sodium chloride flush (NS) 0.9 % injection 3 mL    Allergies:   Cilostazol, Trulicity [dulaglutide], Levaquin [levofloxacin], Lisinopril, and Victoza [liraglutide]   Social History   Tobacco Use   Smoking status: Never   Smokeless tobacco: Never  Vaping Use   Vaping Use: Never used  Substance Use Topics   Alcohol use: Not Currently   Drug use: Never    Family Hx: The patient's family history includes Diabetes in his brother and brother; Heart attack in his father and mother; Pancreatic cancer in his brother. There is no history of Colon cancer, Esophageal cancer, Prostate cancer, Rectal cancer, Stomach cancer, or Colon polyps.  Review of Systems  Respiratory:  Negative for cough and wheezing.    Hematologic/Lymphatic: Negative for bleeding problem.    EKGs/Labs/Other Test Reviewed:    EKG:  EKG is   ordered today.  The ekg ordered today demonstrates sinus arrhythmia with PACs, intermittent right bundle branch block, HR 87, QTc 505  Recent Labs: 05/10/2021: ALT 21; B Natriuretic  Peptide 213.1 05/31/2021: BUN 44; Creatinine, Ser 2.07; Magnesium 2.0; Platelets 251 06/06/2021: Hemoglobin 14.6; Hemoglobin 15.3; Potassium 4.1; Potassium 4.6; Sodium 140; Sodium 138   Recent Lipid Panel Lab Results  Component Value Date/Time   CHOL 129 06/11/2020 09:15 AM   TRIG 84 06/11/2020 09:15 AM   HDL 41 06/11/2020 09:15 AM   LDLCALC 72 06/11/2020 09:15 AM     Risk Assessment/Calculations:    CHA2DS2-VASc Score = 6   This indicates a 9.7% annual risk of stroke. The patient's score is based upon: CHF History: 1 HTN History: 1 Diabetes History: 1 Stroke History: 0 Vascular Disease History: 1 Age Score: 2 Gender Score: 0         Physical Exam:    VS:  BP 116/62   Pulse 87   Ht 5\' 6"  (1.676 m)   Wt 191 lb 9.6 oz (86.9 kg)   SpO2 97%   BMI 30.93 kg/m     Wt Readings from Last 3 Encounters:  06/26/21 191 lb 9.6 oz (86.9 kg)  06/18/21 188 lb 12.8 oz (85.6 kg)  06/06/21 186 lb (84.4 kg)    Constitutional:      Appearance: Healthy appearance. Not in distress.  Neck:     Vascular: JVD normal.  Pulmonary:     Effort: Pulmonary effort is normal.     Breath sounds: No wheezing. No rales.  Cardiovascular:     Normal rate. Irregular rhythm. Normal S1. Normal S2.   Edema:    Peripheral edema absent.  Abdominal:     Palpations: Abdomen is soft.  Skin:    General: Skin is warm and dry.  Neurological:     General: No focal deficit present.     Mental Status: Alert and oriented to person, place and time.         ASSESSMENT & PLAN:   1. Shortness of breath Etiology of his shortness of breath is not entirely clear.  He was diuresed in the hospital.  He had a follow-up right  heart cath demonstrated low filling pressures.  He has seen pulmonology.  Pulmonary function testing is pending.  As noted above, he seems to be describing a lot of nasal congestion as well.  If his pulmonary evaluation is completely normal, it may be worthwhile sending him to ENT.  I will get a follow-up NT proBNP today.  Keep follow-up with Dr. Johney Frame in November as planned.  2. Chronic heart failure with preserved ejection fraction ([place]) EF by echocardiogram in 8/22 was normal.  NYHA IIIb.  As noted, his shortness of breath is really not improved any.  Since he does note improved symptoms on the days he takes furosemide, I will recheck a BNP today.  Given his kidney function, I would try to avoid increasing diuretics unless absolutely necessary.  We could consider SGLT2 inhibitor therapy at some point.  His sugars have been somewhat uncontrolled.  It may be better to wait until his sugars are better controlled to avoid significant glucosuria.  3. Coronary artery disease involving coronary bypass graft of native heart without angina pectoris History of CABG in 2017 after presenting with myocardial infarction.  He had subsequent graft failure 1 month later.  Myoview in 03/2020 was low risk.  He is not having chest pain to suggest angina.  Continue atorvastatin 40 mg daily.  He is off beta-blocker secondary to episodes of bradycardia.  4. Paroxysmal atrial fibrillation ([name]) Maintaining sinus rhythm today.  He seems to be tolerating anticoagulation  well.  Given his age and weight, he should remain on apixaban 5 mg twice daily.  Recent hemoglobin normal.  5. Tachycardia-bradycardia syndrome ([place]) As noted, he has follow-up with Dr. [name] next month.  6. CKD (chronic kidney disease) stage 4, GFR 15-29 ml/min ([name]) He follows with Dr. Justin Mend.  7. Essential hypertension Blood pressure well controlled on current therapy.  8. Pure hypercholesterolemia Continue atorvastatin 40 mg daily.     Dispo:   Return for Keep follow up with Dr. Johney Frame..   Medication Adjustments/Labs and Tests Ordered: Current medicines are reviewed at length with the patient today.  Concerns regarding medicines are outlined above.  Tests Ordered: Orders Placed This Encounter  Procedures   Pro b natriuretic peptide   EKG 12-Lead   Medication Changes: No orders of the defined types were placed in this encounter.  Signed, [name], [name]  06/26/2021 [facility] [address], [place], [place]  [address] Phone: [phone]; Fax: [phone]

## 2021-06-25 NOTE — Progress Notes (Signed)
06/25/2021     CHIEF COMPLAINT Patient presents for  Chief Complaint  Patient presents with   Retina Follow Up      HISTORY OF PRESENT ILLNESS: Fernando Hess is a 75 y.o. male who presents to the clinic today for:   HPI     Retina Follow Up   Patient presents with  Diabetic Retinopathy.  In both eyes.  This started 8 weeks ago.  Severity is mild.  Duration of 8 weeks.  Since onset it is stable.        Comments   8 week fu ou and oct fp/ffa r/l and avastin od Pt states [facility] OU stable since last visit. Pt denies FOL, floaters, or ocular pain OU.  A1C: 9.1 LBS: 377      Last edited by Kendra Opitz, COA on 06/25/2021 10:59 AM.      Referring physician: [name], MD [place],  [place] [id number]  HISTORICAL INFORMATION:   Selected notes from the MEDICAL RECORD NUMBER    Lab Results  Component Value Date   HGBA1C 11.5 (H) 05/11/2021     CURRENT MEDICATIONS: No current outpatient medications on file. (Ophthalmic Drugs)   No current facility-administered medications for this visit. (Ophthalmic Drugs)   Current Outpatient Medications (Other)  Medication Sig   acetaminophen (TYLENOL) 325 MG tablet Take 325-650 mg by mouth every 6 (six) hours as needed for headache (pain).   acetaminophen-codeine (TYLENOL #3) 300-30 MG tablet Take 1 tablet by mouth every 6 (six) hours as needed (pain).   albuterol (VENTOLIN HFA) 108 (90 Base) MCG/ACT inhaler as needed.   atorvastatin (LIPITOR) 40 MG tablet Take 40 mg by mouth every evening.   B-D UF III MINI PEN NEEDLES 31G X 5 MM MISC USE AS DIRECTED WITH LANTUS SOLOSTAR   Cholecalciferol (VITAMIN D3) 50 MCG (2000 UT) TABS Take 2,000 Units by mouth every evening.   DULoxetine (CYMBALTA) 30 MG capsule Take 30 mg by mouth every morning. Take with a 60 mg capsule for a total morning dose of 90 mg   DULoxetine (CYMBALTA) 60 MG capsule Take 60 mg by mouth every morning. Take with a 30 mg capsule for a total  morning dose of 90 mg   ELIQUIS 5 MG TABS tablet TAKE 1 TABLET BY MOUTH TWICE A DAY   fluticasone (FLONASE) 50 MCG/ACT nasal spray as needed.   furosemide (LASIX) 20 MG tablet Take 20 mg by mouth See admin instructions. Take one tablet (20 mg) by mouth up to three times weekly as needed for swelling/fluid.   hydrOXYzine (VISTARIL) 25 MG capsule Take 50 mg by mouth every 8 (eight) hours as needed for itching (sleep).   Insulin Glargine (BASAGLAR KWIKPEN) 100 UNIT/ML SOPN Inject 14-34 Units into the skin See admin instructions. Inject 34 units subcutaneously every morning and 14 units every evening   insulin lispro (HUMALOG) 100 UNIT/ML KwikPen Inject 4 Units into the skin daily with lunch.   MAGNESIUM PO Take 350 mg by mouth every evening. Contains blueberry powder 100  mg   Melatonin 3 MG TABS Take 3 mg by mouth daily as needed (for sleep).   Multiple Vitamin (MULTIVITAMIN WITH MINERALS) TABS tablet Take 1 tablet by mouth every morning.   nitroGLYCERIN (NITROSTAT) 0.4 MG SL tablet Place 0.4 mg under the tongue every 5 (five) minutes as needed for chest pain.   ondansetron (ZOFRAN-ODT) 4 MG disintegrating tablet Take 4 mg by mouth every 8 (eight)  hours as needed for nausea or vomiting.   ONETOUCH ULTRA test strip FOR USE OF CHEACKING BLOOD ONCE DAILY   pantoprazole (PROTONIX) 40 MG tablet TAKE 1 TABLET BY MOUTH EVERY DAY   zaleplon ([name]) 5 MG capsule 1 at bedtime as needed for sleep   Current Facility-Administered Medications (Other)  Medication Route   sodium chloride flush (NS) 0.9 % injection 3 mL Intravenous      REVIEW OF SYSTEMS:    ALLERGIES Allergies  Allergen Reactions   Cilostazol Swelling    edema   Trulicity [Dulaglutide] Nausea And Vomiting   Levaquin [Levofloxacin] Itching and Rash   Lisinopril Itching and Rash   Victoza [Liraglutide] Other (See Comments)    Severe fatigue & insomnia    PAST MEDICAL HISTORY Past Medical History:  Diagnosis Date   Acute renal  failure (ARF) ([place]) 09/24/2015   Atrial premature complexes    CAD (coronary artery disease) of artery bypass graft    Early occlusion of saphenous vein graft to intermediate and marginal branch in February 2007 following bypass grafting    CAD (coronary artery disease), native coronary artery 2017   hx NSTEMI 09-24-2015  s/p  CABG x5 on 10-02-2015;  post op STEMI inferolateral wall,  SVG OM1 and SVG OM2 occluded, distal OM occlusion the calpruit, treated medically // Myoview 7/21: no ischemia, EF 65, low risk   CKD (chronic kidney disease), stage III ([name])    Elevated troponin    Erectile dysfunction    Esophageal reflux    History of atrial fibrillation    post op CABG 10-02-2015   History of non-ST elevation myocardial infarction (NSTEMI) 09/24/2015   s/p  CABG x5   History of ST elevation myocardial infarction (STEMI) 10/22/2015   inferior wall,  post op CABG 10-02-2015   Hyperlipidemia    Hypertension    Left ureteral stone    Mild atherosclerosis of both carotid arteries    Nephrolithiasis    per CT bilateral non-obstructive calculi   OSA (obstructive sleep apnea)    Peripheral artery disease    LE Arterial US 01/2019: R PTA and ATA occluded; L ATA occluded   RBBB (right bundle branch block)    Renal atrophy, right    Sleep apnea    wears cpap    ST elevation myocardial infarction (STEMI) of inferior wall ([place]) 10/22/2015   Type 2 diabetes mellitus treated with insulin ([place])    followed by pcp   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 ([place]) 03/01/2008   Wears glasses    Past Surgical History:  Procedure Laterality Date   [place] N/A 09/26/2015   Procedure: Left Heart Cath and Coronary Angiography;  Surgeon: Troy Sine, MD;  Location: [facility];  Service: Cardiovascular;  Laterality: N/A;   CARDIAC CATHETERIZATION N/A 10/22/2015   Procedure: Left Heart  Cath and Coronary Angiography;  Surgeon: Sherren Mocha, MD;  Location: [facility];  Service: Cardiovascular;  Laterality: N/A;   CATARACT EXTRACTION W/ INTRAOCULAR LENS  IMPLANT, BILATERAL  2017   COLONOSCOPY     CORONARY ARTERY BYPASS GRAFT N/A 10/02/2015   Procedure: CORONARY ARTERY BYPASS GRAFTING (CABG) X5 LIMA-LAD; SVG-DIAG; SVG-OM; SVG-PD; SVG-RAMUS TRANSESOPHAGEAL ECHOCARDIOGRAM (TEE) ENDOSCOPIC GREATER SAPHENOUS VEIN  HARVEST BILAT LE;  Surgeon: Ivin Poot, MD;  Location: [facility];  Service: Open Heart Surgery;  Laterality: N/A;   CYSTOSCOPY/URETEROSCOPY/HOLMIUM LASER/STENT PLACEMENT Left  08/10/2018   Procedure: CYSTOSCOPY/URETEROSCOPY/HOLMIUM LASER/STENT PLACEMENT;  Surgeon: Festus Aloe, MD;  Location: [facility];  Service: Urology;  Laterality: Left;   CYSTOSCOPY/URETEROSCOPY/HOLMIUM LASER/STENT PLACEMENT Left 09/10/2018   Procedure: CYSTOSCOPY/URETEROSCOPY/HOLMIUM LASER/STENT EXCHANGE;  Surgeon: Festus Aloe, MD;  Location: [facility];  Service: Urology;  Laterality: Left;   LEFT HEART CATHETERIZATION WITH CORONARY ANGIOGRAM N/A 04/13/2014   Procedure: LEFT HEART CATHETERIZATION WITH CORONARY ANGIOGRAM;  Surgeon: Jacolyn Reedy, MD;  Location: [facility];  Service: Cardiovascular;  Laterality: N/A;   LEG SURGERY Right age 27   closed reduction leg fracture   POLYPECTOMY     RIGHT HEART CATH N/A 06/06/2021   Procedure: RIGHT HEART CATH;  Surgeon: Jolaine Artist, MD;  Location: [facility];  Service: Cardiovascular;  Laterality: N/A;   TEE WITHOUT CARDIOVERSION N/A 10/02/2015   Procedure: TRANSESOPHAGEAL ECHOCARDIOGRAM (TEE);  Surgeon: Ivin Poot, MD;  Location: [facility];  Service: Open Heart Surgery;  Laterality: N/A;   URETEROSCOPY WITH HOLMIUM LASER LITHOTRIPSY Bilateral 2004;  2005  dr grapey  _0    VASECTOMY      FAMILY HISTORY Family History  Problem Relation Age of Onset   Heart attack Mother    Heart attack Father    Diabetes  Brother    Pancreatic cancer Brother    Diabetes Brother    Colon cancer Neg Hx    Esophageal cancer Neg Hx    Prostate cancer Neg Hx    Rectal cancer Neg Hx    Stomach cancer Neg Hx    Colon polyps Neg Hx     SOCIAL HISTORY Social History   Tobacco Use   Smoking status: Never   Smokeless tobacco: Never  Vaping Use   Vaping Use: Never used  Substance Use Topics   Alcohol use: Not Currently   Drug use: Never         OPHTHALMIC EXAM:  Base Eye Exam     Visual Acuity (ETDRS)       Right Left   Dist cc 20/30 -2 20/30 -1   Dist ph cc NI NI    Correction: Glasses         Tonometry (Tonopen, 11:02 AM)       Right Left   Pressure 14 21         Pupils       Pupils Dark Light Shape React APD   Right PERRL 5 5 Round Minimal None   Left PERRL 5 5 Round Minimal None         Visual Fields (Counting fingers)       Left Right    Full Full         Extraocular Movement       Right Left    Full Full         Neuro/Psych     Oriented x3: Yes   Mood/Affect: Normal         Dilation     Both eyes: 1.0% Mydriacyl, 2.5% Phenylephrine @ 11:02 AM           Slit Lamp and Fundus Exam     External Exam       Right Left   External Normal Normal         Slit Lamp Exam       Right Left   Lids/Lashes Normal Normal   Conjunctiva/Sclera White and quiet White and quiet   Cornea Clear Clear   Anterior Chamber Deep and quiet  Deep and quiet   Iris Round and reactive Round and reactive   Lens Posterior chamber intraocular lens Posterior chamber intraocular lens   Anterior Vitreous Normal Normal         Fundus Exam       Right Left   Posterior Vitreous Posterior vitreous detachment Normal   Disc Normal Normal   C/D Ratio 0.5 0.55   Macula Macular thickening superotemporal to fovea., Microaneurysms, Exudates no clinically significant macular edema, no macular thickening, Microaneurysms   Vessels NPDR- Moderate NPDR- Moderate   Periphery  Choroidal nevus, small in macula,, stable, 2 dd size, flat Dot-blot hemorrhage            IMAGING AND PROCEDURES  Imaging and Procedures for 06/25/21  OCT, Retina - OU - Both Eyes       Right Eye Quality was good. Scan locations included subfoveal. Central Foveal Thickness: 260. Progression has worsened. Findings include abnormal foveal contour, cystoid macular edema.   Left Eye Quality was good. Scan locations included subfoveal. Central Foveal Thickness: 257. Progression has been stable. Findings include abnormal foveal contour.   Notes CSME superotemporal to the fovea OD, minor OD today, will continue to monitor and will observe now at nearly 8 weeks post most recent injection.  OS no center involved CSME observe      Color Fundus Photography Optos - OU - Both Eyes       Right Eye Progression has worsened. Disc findings include normal observations. Macula : microaneurysms. Vessels : normal observations. Periphery : normal observations.   Left Eye Progression has worsened. Disc findings include normal observations. Macula : microaneurysms. Vessels : normal observations. Periphery : normal observations.   Notes Moderate nonproliferative diabetic retinopathy OU,     Fluorescein Angiography Optos (Transit OD)       Injection: 500 mg Fluorescein Sodium 10 %   Route: Intravenous, Site: Left Arm   NDC: [phone]   Right Eye   Progression has been stable. Early phase findings include microaneurysm. Mid/Late phase findings include microaneurysm. Choroidal neovascularization is not present.   Left Eye   Progression has been stable. Mid/Late phase findings include microaneurysm. Choroidal neovascularization is not present.   Notes Moderate NPDR confirmed and only minor CSME will continue to observe             ASSESSMENT/PLAN:  OSA on CPAP Doing well with CPAP use.  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  No signs of MAC-TEL related issues.  Patient continues on CPAP use  CSME will not need specific therapy today  Moderate nonproliferative diabetic retinopathy of left eye with macular edema associated with type 2 diabetes mellitus ([name]) OS, with also only mild CSME Not center of the macula threatening we will thus continue to observe     ICD-10-CM   1. 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 OSA on CPAP  G47.33    Z99.89     3. 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  CSME OD slightly improved since last visit.  Thus a spontaneous with no impact on acuity.  We will thus observe  2.  OS, no center involved CSME observe  3.  Ophthalmic Meds Ordered this visit:  Meds ordered this encounter  Medications   Fluorescein Sodium 10 % injection 500 mg       Return in about 3 months (around 09/25/2021) for DILATE OU, OCT.  There are no Patient Instructions on file for this visit.   Explained the diagnoses, plan, and follow up with the patient and they expressed understanding.  Patient expressed understanding of the importance of proper follow up care.   Clent Demark Colette Dicamillo M.D. Diseases & Surgery of the Retina and Vitreous Retina & Diabetic [place] 06/25/21     Abbreviations: M myopia (nearsighted); A astigmatism; H hyperopia (farsighted); P presbyopia; Mrx spectacle prescription;  CTL contact lenses; OD right eye; OS left eye; OU both eyes  XT exotropia; ET esotropia; PEK punctate epithelial  keratitis; PEE punctate epithelial erosions; DES dry eye syndrome; MGD meibomian gland dysfunction; ATs artificial tears; PFAT's preservative free artificial tears; [place] nuclear sclerotic cataract; PSC posterior subcapsular cataract; ERM epi-retinal membrane; PVD posterior vitreous detachment; RD retinal detachment; DM diabetes mellitus; DR diabetic retinopathy; NPDR non-proliferative diabetic retinopathy; PDR proliferative diabetic retinopathy; CSME clinically significant macular edema; DME diabetic macular edema; dbh dot blot hemorrhages; CWS cotton wool spot; POAG primary open angle glaucoma; C/D cup-to-disc ratio; HVF humphrey visual field; GVF goldmann visual field; OCT optical coherence tomography; IOP intraocular pressure; BRVO Branch retinal vein occlusion; CRVO central retinal vein occlusion; CRAO central retinal artery occlusion; BRAO branch retinal artery occlusion; RT retinal tear; SB scleral buckle; PPV pars plana vitrectomy; VH Vitreous hemorrhage; PRP panretinal laser photocoagulation; IVK intravitreal kenalog; VMT vitreomacular traction; MH Macular hole;  NVD neovascularization of the disc; NVE neovascularization elsewhere; AREDS age related eye disease study; ARMD age related macular degeneration; POAG primary open angle glaucoma; EBMD epithelial/anterior basement membrane dystrophy; ACIOL anterior chamber intraocular lens; IOL intraocular lens; PCIOL posterior chamber intraocular lens; Phaco/IOL phacoemulsification with intraocular lens placement; [place] photorefractive keratectomy; LASIK laser assisted in situ keratomileusis; HTN hypertension; DM diabetes mellitus; COPD chronic obstructive pulmonary disease

## 2021-06-26 ENCOUNTER — Encounter: Payer: Self-pay | Admitting: Physician Assistant

## 2021-06-26 ENCOUNTER — Ambulatory Visit (INDEPENDENT_AMBULATORY_CARE_PROVIDER_SITE_OTHER): Payer: Medicare Other | Admitting: Physician Assistant

## 2021-06-26 VITALS — BP 116/62 | HR 87 | Ht 66.0 in | Wt 191.6 lb

## 2021-06-26 DIAGNOSIS — E785 Hyperlipidemia, unspecified: Secondary | ICD-10-CM

## 2021-06-26 DIAGNOSIS — R0602 Shortness of breath: Secondary | ICD-10-CM | POA: Diagnosis not present

## 2021-06-26 DIAGNOSIS — N184 Chronic kidney disease, stage 4 (severe): Secondary | ICD-10-CM | POA: Diagnosis not present

## 2021-06-26 DIAGNOSIS — I5032 Chronic diastolic (congestive) heart failure: Secondary | ICD-10-CM | POA: Diagnosis not present

## 2021-06-26 DIAGNOSIS — I25810 Atherosclerosis of coronary artery bypass graft(s) without angina pectoris: Secondary | ICD-10-CM

## 2021-06-26 DIAGNOSIS — I480 Paroxysmal atrial fibrillation: Secondary | ICD-10-CM

## 2021-06-26 DIAGNOSIS — E7800 Pure hypercholesterolemia, unspecified: Secondary | ICD-10-CM

## 2021-06-26 DIAGNOSIS — I10 Essential (primary) hypertension: Secondary | ICD-10-CM

## 2021-06-26 DIAGNOSIS — I495 Sick sinus syndrome: Secondary | ICD-10-CM | POA: Diagnosis not present

## 2021-06-26 NOTE — Patient Instructions (Signed)
Medication Instructions:   Your physician recommends that you continue on your current medications as directed. Please refer to the Current Medication list given to you today.  *If you need a refill on your cardiac medications before your next appointment, please call your pharmacy*   Lab Work: TODAY!!!! PRO BNP  If you have labs (blood work) drawn today and your tests are completely normal, you will receive your results only by: [place] (if you have MyChart) OR A paper copy in the mail If you have any lab test that is abnormal or we need to change your treatment, we will call you to review the results.  Testing/Procedures:  -NONE-   Follow-Up: At [facility], you and your health needs are our priority.  As part of our continuing mission to provide you with exceptional heart care, we have created designated Provider Care Teams.  These Care Teams include your primary Cardiologist (physician) and Advanced Practice Providers (APPs -  Physician Assistants and Nurse Practitioners) who all work together to provide you with the care you need, when you need it.  We recommend signing up for the patient portal called "MyChart".  Sign up information is provided on this After Visit Summary.  MyChart is used to connect with patients for Virtual Visits (Telemedicine).  Patients are able to view lab/test results, encounter notes, upcoming appointments, etc.  Non-urgent messages can be sent to your provider as well.   To learn more about what you can do with MyChart, go to [email or web address].    Your next appointment:    Keep your follow up with Dr. Johney Frame and Dr. [name].

## 2021-06-27 LAB — PRO B NATRIURETIC PEPTIDE: NT-Pro BNP: 627 pg/mL — ABNORMAL HIGH (ref 0–486)

## 2021-07-05 ENCOUNTER — Ambulatory Visit
Admission: RE | Admit: 2021-07-05 | Discharge: 2021-07-05 | Disposition: A | Discharge status: Home / Self Care | Payer: Medicare Other | Source: Ambulatory Visit | Attending: Nephrology | Admitting: Nephrology

## 2021-07-05 ENCOUNTER — Other Ambulatory Visit: Payer: Self-pay | Admitting: Nephrology

## 2021-07-05 DIAGNOSIS — I509 Heart failure, unspecified: Secondary | ICD-10-CM

## 2021-07-16 DIAGNOSIS — Z7901 Long term (current) use of anticoagulants: Secondary | ICD-10-CM | POA: Insufficient documentation

## 2021-07-18 ENCOUNTER — Ambulatory Visit: Payer: Medicare Other | Admitting: Cardiology

## 2021-07-23 NOTE — Progress Notes (Deleted)
Cardiology Office Note:    Date:  07/23/2021   ID:  Fernando Hess, DOB September 09, 1946, MRN [id number]  PCP:  [name], MD   [facility] Cardiologist:  Freada Bergeron, MD Cardiology APP:  Liliane Shi, [name]  Electrophysiologist:  Vickie Epley, MD {   Referring MD: [name], MD    History of Present Illness:    Fernando Hess is a 75 y.o. male with a hx of CAD s/p CABG in 2017, HFpEF, pAfib on apixaban, mild AS, DMII, HTN, HLD, CKD IV, OSA, and PAD who was previously followed by Dr. Meda Coffee who now presents to clinic for follow-up.   Per review of the record, the patient suffered a myocardial infarction in January 2017 and underwent multivessel CABG.  Postoperative course was complicated by atrial fibrillation treated with amiodarone.  He presented back to the hospital in 10/2015 with an inferolateral ST elevation myocardial infarction.  He had significant hyperkalemia and nausea and vomiting. Amiodarone was stopped secondary to nausea and vomiting.  Cardiac catheterization demonstrated an occluded SVG-OM1 and occluded SVG-OM2.  His distal OM was occluded.  The distal OM occlusion was felt to be the culprit for his myocardial infarction.  He had diabetic appearing vessels and medical therapy was recommended.   The patient has been struggling with episode of falls as well as shortness of breath.  With regards to shortness of breath he tries to walk a mile every day and has to stop several times. He underwent nuclear stress test 03/2020  that showed no evidence of prior infarct or ischemia.   With regards to dizziness he says that it has improved and he has not had any falls since he started to use a cane.  He saw Dr. 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 [name] recommended a loop monitor however patient was not interested at that time.  He was admitted 05/10/21-05/12/21 with episodes of syncope and  shortness of breath.  TTE demonstrated normal EF.  His BNP was mildly elevated and he was diuresed with IV furosemide.  PRN furosemide was recommended at DC.  His beta-blocker was DC'd.  He had orthostatic hypotension noted.  He saw Dr. [name] after DC and was set up for RHC due to ongoing shortness of breath and weakness.  RHC showed low filling pressures.  He was asked to reduce furosemide use (he had been taking it daily prior to [place]) to 3 times a week.  An OP event monitor showed frequent asymptomatic PACs and short runs of Supraventricular Tachycardia (no AF or pauses).  Last saw [name] on 06/26/21 where he continued to have SOB and was being seen by Pulmonary. Appeared compensated from volume standpoint. BNP was elevated at 627.  Today,    Past Medical History:  Diagnosis Date   Acute renal failure (ARF) ([place]) 09/24/2015   Atrial premature complexes    CAD (coronary artery disease) of artery bypass graft    Early occlusion of saphenous vein graft to intermediate and marginal branch in February 2007 following bypass grafting    CAD (coronary artery disease), native coronary artery 2017   hx NSTEMI 09-24-2015  s/p  CABG x5 on 10-02-2015;  post op STEMI inferolateral wall,  SVG OM1 and SVG OM2 occluded, distal OM occlusion the calpruit, treated medically // Myoview 7/21: no ischemia, EF 65, low risk   CKD (chronic kidney disease), stage III ([name])    Elevated troponin  Erectile dysfunction    Esophageal reflux    History of atrial fibrillation    post op CABG 10-02-2015   History of non-ST elevation myocardial infarction (NSTEMI) 09/24/2015   s/p  CABG x5   History of ST elevation myocardial infarction (STEMI) 10/22/2015   inferior wall,  post op CABG 10-02-2015   Hyperlipidemia    Hypertension    Left ureteral stone    Mild atherosclerosis of both carotid arteries    Nephrolithiasis    per CT bilateral non-obstructive calculi   OSA (obstructive sleep apnea)     Peripheral artery disease    LE Arterial US 01/2019: R PTA and ATA occluded; L ATA occluded   RBBB (right bundle branch block)    Renal atrophy, right    Sleep apnea    wears cpap    ST elevation myocardial infarction (STEMI) of inferior wall ([place]) 10/22/2015   Type 2 diabetes mellitus treated with insulin ([place])    followed by pcp   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 ([place]) 03/01/2008   Wears glasses     Past Surgical History:  Procedure Laterality Date   APPENDECTOMY  1965   CARDIAC CATHETERIZATION N/A 09/26/2015   Procedure: Left Heart Cath and Coronary Angiography;  Surgeon: Troy Sine, MD;  Location: [facility];  Service: Cardiovascular;  Laterality: N/A;   CARDIAC CATHETERIZATION N/A 10/22/2015   Procedure: Left Heart Cath and Coronary Angiography;  Surgeon: Sherren Mocha, MD;  Location: [facility];  Service: Cardiovascular;  Laterality: N/A;   CATARACT EXTRACTION W/ INTRAOCULAR LENS  IMPLANT, BILATERAL  2017   COLONOSCOPY     CORONARY ARTERY BYPASS GRAFT N/A 10/02/2015   Procedure: CORONARY ARTERY BYPASS GRAFTING (CABG) X5 LIMA-LAD; SVG-DIAG; SVG-OM; SVG-PD; SVG-RAMUS TRANSESOPHAGEAL ECHOCARDIOGRAM (TEE) ENDOSCOPIC GREATER SAPHENOUS VEIN  HARVEST BILAT LE;  Surgeon: Ivin Poot, MD;  Location: [facility];  Service: Open Heart Surgery;  Laterality: N/A;   CYSTOSCOPY/URETEROSCOPY/HOLMIUM LASER/STENT PLACEMENT Left 08/10/2018   Procedure: CYSTOSCOPY/URETEROSCOPY/HOLMIUM LASER/STENT PLACEMENT;  Surgeon: Festus Aloe, MD;  Location[facility];  Service: Urology;  Laterality: Left;   CYSTOSCOPY/URETEROSCOPY/HOLMIUM LASER/STENT PLACEMENT Left 09/10/2018   Procedure: CYSTOSCOPY/URETEROSCOPY/HOLMIUM LASER/STENT EXCHANGE;  Surgeon: Festus Aloe, MD;  Location: [facility];  Service: Urology;  Laterality: Left;   LEFT HEART CATHETERIZATION WITH CORONARY ANGIOGRAM N/A 04/13/2014    Procedure: LEFT HEART CATHETERIZATION WITH CORONARY ANGIOGRAM;  Surgeon: Jacolyn Reedy, MD;  Location: [facility];  Service: Cardiovascular;  Laterality: N/A;   LEG SURGERY Right age 48   closed reduction leg fracture   POLYPECTOMY     RIGHT HEART CATH N/A 06/06/2021   Procedure: RIGHT HEART CATH;  Surgeon: Jolaine Artist, MD;  Location: [facility];  Service: Cardiovascular;  Laterality: N/A;   TEE WITHOUT CARDIOVERSION N/A 10/02/2015   Procedure: TRANSESOPHAGEAL ECHOCARDIOGRAM (TEE);  Surgeon: Ivin Poot, MD;  Location: [facility];  Service: Open Heart Surgery;  Laterality: N/A;   URETEROSCOPY WITH HOLMIUM LASER LITHOTRIPSY Bilateral 2004;  2005  dr Risa Grill  @[facility]    VASECTOMY      Current Medications: No outpatient medications have been marked as taking for the 08/06/21 encounter (Appointment) with Freada Bergeron, MD.   Current Facility-Administered Medications for the 08/06/21 encounter (Appointment) with Freada Bergeron, MD  Medication   sodium chloride flush (NS) 0.9 % injection 3 mL     Allergies:   Cilostazol, Trulicity [dulaglutide], Levaquin [  levofloxacin], Lisinopril, and Victoza [liraglutide]   Social History   Socioeconomic History   Marital status: Married    Spouse name: Not on file   Number of children: Not on file   Years of education: Not on file   Highest education level: Not on file  Occupational History   Occupation: retired  Tobacco Use   Smoking status: Never   Smokeless tobacco: Never  Vaping Use   Vaping Use: Never used  Substance and Sexual Activity   Alcohol use: Not Currently   Drug use: Never   Sexual activity: Not on file  Other Topics Concern   Not on file  Social History Narrative   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 There is no history of Colon cancer, Esophageal cancer, Prostate cancer, Rectal cancer, Stomach cancer, or Colon polyps.  ROS:   Please see the history of present illness.    *** All other systems reviewed and are negative.  EKGs/Labs/Other Studies Reviewed:    The following studies were reviewed today: Prior CV studies: Cardiac monitor 06/07/21 Patient had a minimum heart rate of 43 bpm, maximum heart rate of 171 bpm (during SVT), and average heart rate of 76 bpm. Predominant underlying rhythm was sinus rhythm. Frequent short runs of supraventricular tachycardia occurred lasting 45 seconds at longest with a max rate of 171 bpm at fastest (most much slower). Isolated PACs were frequent (7.3%). Isolated PVCs were rare (<1.0%). No triggered and diary events. Frequent, asymptomatic PACs and short runs of SVT   R cardiac catheterization 06/06/21 RA = 2 RV = 32/5 PA = 31/11 (19) PCW = 4 Fick cardiac output/index = 4.1/2.1 PVR = 3.6 WU FA sat = 98% PA sat = 68%, 70% PAPi = 10 Assessment: 1. Low filling pressures with moderately reduce CO   Echocardiogram 05/10/21 EF 60-65, mild LVH, GR 1 DD, mildly reduced RVSF, mild LAE, trivial MR, moderate AV calcification, mild AS (mean 11.7, V-max 250 cm/s)   Carotid US 06/21/20 Bilat ICA 1-39   Event monitor 04/2020 Sinus bradycardia with minimum rate 48 BPM, pauses up to 3 seconds even during awake hours. Frequent epsiodes of atrial fibrillation with RVR (2% of the monitoring time).  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    Myoview 03/20/20 EF 65, apical thinning artifact, no ischemia, low risk    Echocardiogram 11/19/2019 EF 60-65, moderate LVH, GR 1  DD, no RWMA, mild AI, mild MR, mildly dilated ascending aorta (39 mm)   LE arterial US 02/11/2019 Right PTA and ATA occluded; left ATA occluded   Myoview 10/21/2018 Low risk, mildly abnormal stress nuclear study with apical thinning but no ischemia; EF 52 with mild global hypokinesis    Holter 06/11/18 Very frequent supraventricular ectopy, contributing to 15% of overall beats. Very frequent couplets and a very short runs of atrial tachycardia.   Echocardiogram 10/23/2015 EF 55-60, normal wall motion, mildly calcified aortic valve leaflets, mild AI, mild to moderate MR   Cardiac catheterization  10/22/2015 LM ostial 50 LAD mid 90 RI ostial 90 LCx proximal 80; OM1 80; OM2 100 (culprit for MI) RCA mid 50 LIMA-LAD patent SVG-RPDA patent SVG-D1 patent SVG-OM1 100 SVG-OM2 100 Medical therapy   Pre-CABG Dopplers 09/28/2015 Summary: Findings suggest 1-39% internal carotid artery stenosis bilaterally. Vertebral arteries are patent with antegrade flow. Bilateral ABIs are within normal limits.    EKG:  EKG is *** ordered today.  The ekg ordered today demonstrates ***  Recent Labs: 05/10/2021: ALT 21; B Natriuretic Peptide 213.1 05/31/2021: BUN 44; Creatinine, Ser 2.07; Magnesium 2.0; Platelets 251 06/06/2021: Hemoglobin 14.6; Hemoglobin 15.3; Potassium 4.1; Potassium 4.6; Sodium 140; Sodium 138 06/26/2021: NT-Pro BNP 627  Recent Lipid Panel    Component Value Date/Time   CHOL 129 06/11/2020 0915   TRIG 84 06/11/2020 0915   HDL 41 06/11/2020 0915   CHOLHDL 3.1 06/11/2020 0915   CHOLHDL 2.7 10/23/2015 0515   VLDL 20 10/23/2015 0515   LDLCALC 72 06/11/2020 0915     Risk Assessment/Calculations:   {Does this patient have ATRIAL FIBRILLATION?:[phone]}       Physical Exam:    VS:  There were no vitals taken for this visit.    Wt Readings from Last 3 Encounters:  06/26/21 191 lb 9.6 oz (86.9 kg)  06/18/21 188 lb 12.8 oz (85.6 kg)  06/06/21 186 lb (84.4 kg)     GEN: *** Well  nourished, well developed in no acute distress HEENT: Normal NECK: No JVD; No carotid bruits LYMPHATICS: No lymphadenopathy CARDIAC: ***RRR, no murmurs, rubs, gallops RESPIRATORY:  Clear to auscultation without rales, wheezing or rhonchi  ABDOMEN: Soft, non-tender, non-distended MUSCULOSKELETAL:  No edema; No deformity  SKIN: Warm and dry NEUROLOGIC:  Alert and oriented x 3 PSYCHIATRIC:  Normal affect   ASSESSMENT:    No diagnosis found. PLAN:    In order of problems listed above:  #Multivessel CAD s/p CABG in 2017: Myoview 03/2020 with no evidence of ischemia or infarction. TTE 05/10/21 with EF 60-65, mild LVH, GR 1 DD, mildly reduced RVSF, mild LAE, trivial MR, moderate AV calcification, mild AS (mean 11.7, V-max 250 cm/s). No chest pain currently.  -Not on ASA due to need for apixaban -Continue lipitor 40mg  daily -Not on BB due to episodes of bradycardia -Not on ACE/ARB due to CKD stage IV  #Paroxysmal Afib: #Tachy-brady syndrome CHADs-vasc 6 on apixaban. Tolerating AC without issues. Has declined loop monitor. -Continue apixaban 5mg  BID -Plans to follow-up with Dr. [name] -Declined loop  #HFpEF: Normal filling pressures on RHC in 04/2021. Compensated and euvolemic on exam. SOB does not correlate with elevated filling pressures and patient is now seeing pulmonary. -Continue lasix 3x/week -Not on ACE/ARB/spiro due to CKD IV  #Chronic SOB: Unclear etiology. Reassuring RHC with normal EF and filling pressures. Most recent myoview without evidence of infarct or ischemia.  -Follow-up with pulm  #CKD Stage IV: Follows with Dr. Justin Mend.  #HLD: -Continue lipitor -Goal LDL<70     {Are you ordering a CV Procedure (e.g. stress test, cath, DCCV, TEE, etc)?   Press F2        :[id number]}    Medication Adjustments/Labs and Tests Ordered: Current medicines are reviewed at length with the patient today.  Concerns regarding medicines are outlined above.  No orders of the defined  types were placed in this encounter.  No orders of the defined types were placed in this encounter.   There are no Patient Instructions on file for this visit.   Signed, Nira Conn  Renae Fickle, MD  07/23/2021 8:47 PM    [facility]

## 2021-08-06 ENCOUNTER — Encounter: Payer: Self-pay | Admitting: Cardiology

## 2021-08-06 ENCOUNTER — Encounter: Payer: Self-pay | Admitting: *Deleted

## 2021-08-06 ENCOUNTER — Other Ambulatory Visit: Payer: Self-pay

## 2021-08-06 ENCOUNTER — Ambulatory Visit (INDEPENDENT_AMBULATORY_CARE_PROVIDER_SITE_OTHER): Payer: Medicare Other | Admitting: Cardiology

## 2021-08-06 VITALS — BP 128/78 | HR 67 | Ht 66.0 in | Wt 197.6 lb

## 2021-08-06 DIAGNOSIS — R0602 Shortness of breath: Secondary | ICD-10-CM

## 2021-08-06 DIAGNOSIS — I25810 Atherosclerosis of coronary artery bypass graft(s) without angina pectoris: Secondary | ICD-10-CM

## 2021-08-06 DIAGNOSIS — Z951 Presence of aortocoronary bypass graft: Secondary | ICD-10-CM | POA: Diagnosis not present

## 2021-08-06 DIAGNOSIS — Z01818 Encounter for other preprocedural examination: Secondary | ICD-10-CM

## 2021-08-06 DIAGNOSIS — I2510 Atherosclerotic heart disease of native coronary artery without angina pectoris: Secondary | ICD-10-CM | POA: Diagnosis not present

## 2021-08-06 DIAGNOSIS — I495 Sick sinus syndrome: Secondary | ICD-10-CM

## 2021-08-06 DIAGNOSIS — I480 Paroxysmal atrial fibrillation: Secondary | ICD-10-CM

## 2021-08-06 DIAGNOSIS — I5032 Chronic diastolic (congestive) heart failure: Secondary | ICD-10-CM

## 2021-08-06 DIAGNOSIS — N184 Chronic kidney disease, stage 4 (severe): Secondary | ICD-10-CM

## 2021-08-06 DIAGNOSIS — I10 Essential (primary) hypertension: Secondary | ICD-10-CM

## 2021-08-06 NOTE — Patient Instructions (Signed)
Medication Instructions:   Your physician recommends that you continue on your current medications as directed. Please refer to the Current Medication list given to you today.  *If you need a refill on your cardiac medications before your next appointment, please call your pharmacy*   Testing/Procedures:  Your physician has requested that you have a lexiscan myoview. For further information please visit [email or web address]. Please follow instruction sheet, as given.    Follow-Up:  3 MONTHS IN THE OFFICE WITH SCOTT WEAVER [name]    Other Instructions  PLEASE CALL YOUR SURGEONS OFFICE THIS WEEK AND HAVE THEM FAX OVER A SURGICAL CLEARANCE FORM FOR OUR OFFICE TO ADVISE ON--HAVE THEM FAX THIS TO DR. Johney Frame AT [phone]

## 2021-08-06 NOTE — Progress Notes (Signed)
Cardiology Office Note:    Date:  08/06/2021   ID:  Fernando Hess, DOB [birth year], MRN [id number]  PCP:  [name], MD   [place] Prov[facility] Cardiologist:  Freada Bergeron, MD Cardiology APP:  Liliane Shi, [name]  Electrophysiologist:  Vickie Epley, MD  {   Referring MD: [name], MD    History of Present Illness:    Fernando Hess is a 75 y.o. male with a hx of CAD s/p CABG in 2017, HFpEF, pAfib on apixaban, mild AS, DMII, HTN, HLD, CKD IV, OSA, and PAD who was previously followed by Dr. Meda Coffee who now presents to clinic for follow-up.   Per review of the record, the patient suffered a myocardial infarction in January 2017 and underwent multivessel CABG.  Postoperative course was complicated by atrial fibrillation treated with amiodarone.  He presented back to the hospital in 10/2015 with an inferolateral ST elevation myocardial infarction.  He had significant hyperkalemia and nausea and vomiting. Amiodarone was stopped secondary to nausea and vomiting.  Cardiac catheterization demonstrated an occluded SVG-OM1 and occluded SVG-OM2.  His distal OM was occluded.  The distal OM occlusion was felt to be the culprit for his myocardial infarction.  He had diabetic appearing vessels and medical therapy was recommended.   The patient has been struggling with episode of falls as well as shortness of breath.  With regards to shortness of breath he tries to walk a mile every day and has to stop several times. He underwent nuclear stress test 03/2020  that showed no evidence of prior infarct or ischemia.   With regards to dizziness he says that it has improved and he has not had any falls since he started to use a cane.  He saw Dr. 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 [name] recommended a loop monitor however patient was not interested at that time.  He was admitted 05/10/21-05/12/21 with episodes of syncope and  shortness of breath.  TTE demonstrated normal EF.  His BNP was mildly elevated and he was diuresed with IV furosemide.  PRN furosemide was recommended at DC.  His beta-blocker was DC'd.  He had orthostatic hypotension noted.  He saw Dr. [name] after DC and was set up for RHC due to ongoing shortness of breath and weakness.  RHC showed low filling pressures.  He was asked to reduce furosemide use (he had been taking it daily prior to [place]) to 3 times a week.  An OP event monitor showed frequent asymptomatic PACs and short runs of Supraventricular Tachycardia (no AF or pauses).  Last saw [name] on 06/26/21 where he continued to have SOB and was being seen by Pulmonary. Appeared compensated from volume standpoint. BNP was elevated at 627.  Today, he has not been doing well. He has not been able to breathe well for the past 6 motnhs as detailed above which has prevented him from sleeping. He saw ENT and was told he has a deviated septum and has been recommended for surgery to open his nasal passage. He is hoping to have pre-operative clearance for surgery today.  Otherwise, he denies chest pain, lightheadedness, headaches, syncope, orthopnea, PND, lower extremity edema. His palpitations are overall improved. States dizziness episodes have resolved since medication adjustments were made. He is not interested in loop recorder.   Past Medical History:  Diagnosis Date   Acute renal failure (ARF) ([place]) 09/24/2015   Atrial premature complexes  CAD (coronary artery disease) of artery bypass graft    Early occlusion of saphenous vein graft to intermediate and marginal branch in February 2007 following bypass grafting    CAD (coronary artery disease), native coronary artery 2017   hx NSTEMI 09-24-2015  s/p  CABG x5 on 10-02-2015;  post op STEMI inferolateral wall,  SVG OM1 and SVG OM2 occluded, distal OM occlusion the calpruit, treated medically // Myoview 7/21: no ischemia, EF 65, low risk   CKD  (chronic kidney disease), stage III ([name])    Elevated troponin    Erectile dysfunction    Esophageal reflux    History of atrial fibrillation    post op CABG 10-02-2015   History of non-ST elevation myocardial infarction (NSTEMI) 09/24/2015   s/p  CABG x5   History of ST elevation myocardial infarction (STEMI) 10/22/2015   inferior wall,  post op CABG 10-02-2015   Hyperlipidemia    Hypertension    Left ureteral stone    Mild atherosclerosis of both carotid arteries    Nephrolithiasis    per CT bilateral non-obstructive calculi   OSA (obstructive sleep apnea)    Peripheral artery disease    LE Arterial US 01/2019: R PTA and ATA occluded; L ATA occluded   RBBB (right bundle branch block)    Renal atrophy, right    Sleep apnea    wears cpap    ST elevation myocardial infarction (STEMI) of inferior wall ([place]) 10/22/2015   Type 2 diabetes mellitus treated with insulin ([place])    followed by pcp   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 ([place]) 03/01/2008   Wears glasses     Past Surgical History:  Procedure Laterality Date   [place] N/A 09/26/2015   Procedure: Left Heart Cath and Coronary Angiography;  Surgeon: Troy Sine, MD;  Location: [facility];  Service: Cardiovascular;  Laterality: N/A;   CARDIAC CATHETERIZATION N/A 10/22/2015   Procedure: Left Heart Cath and Coronary Angiography;  Surgeon: Sherren Mocha, MD;  Location: [facility];  Service: Cardiovascular;  Laterality: N/A;   CATARACT EXTRACTION W/ INTRAOCULAR LENS  IMPLANT, BILATERAL  2017   COLONOSCOPY     CORONARY ARTERY BYPASS GRAFT N/A 10/02/2015   Procedure: CORONARY ARTERY BYPASS GRAFTING (CABG) X5 LIMA-LAD; SVG-DIAG; SVG-OM; SVG-PD; SVG-RAMUS TRANSESOPHAGEAL ECHOCARDIOGRAM (TEE) ENDOSCOPIC GREATER SAPHENOUS VEIN  HARVEST BILAT LE;  Surgeon: Ivin Poot, MD;  Location: [facility];  Service: Open Heart  Surgery;  Laterality: N/A;   CYSTOSCOPY/URETEROSCOPY/HOLMIUM LASER/STENT PLACEMENT Left 08/10/2018   Procedure: CYSTOSCOPY/URETEROSCOPY/HOLMIUM LASER/STENT PLACEMENT;  Surgeon: Festus Aloe, MD;  Location: [facility];  Service: Urology;  Laterality: Left;   CYSTOSCOPY/URETEROSCOPY/HOLMIUM LASER/STENT PLACEMENT Left 09/10/2018   Procedure: CYSTOSCOPY/URETEROSCOPY/HOLMIUM LASER/STENT EXCHANGE;  Surgeon: Festus Aloe, MD;  Location: [facility];  Service: Urology;  Laterality: Left;   LEFT HEART CATHETERIZATION WITH CORONARY ANGIOGRAM N/A 04/13/2014   Procedure: LEFT HEART CATHETERIZATION WITH CORONARY ANGIOGRAM;  Surgeon: Jacolyn Reedy, MD;  Location[facility];  Service: Cardiovascular;  Laterality: N/A;   LEG SURGERY Right age 23   closed reduction leg fracture   POLYPECTOMY     RIGHT HEART CATH N/A 06/06/2021   Procedure: RIGHT HEART CATH;  Surgeon: Jolaine Artist, MD;  Location: [facility];  Service: Cardiovascular;  Laterality: N/A;   TEE WITHOUT CARDIOVERSION N/A 10/02/2015   Procedure: TRANSESOPHAGEAL ECHOCARDIOGRAM (TEE);  Surgeon: Tharon Aquas  Kerby Less, MD;  Location: [facility];  Service: Open Heart Surgery;  Laterality: N/A;   URETEROSCOPY WITH HOLMIUM LASER LITHOTRIPSY Bilateral 2004;  2005  dr Risa Grill  @[facility]    VASECTOMY      Current Medications: Current Meds  Medication Sig   acetaminophen (TYLENOL) 325 MG tablet Take 325-650 mg by mouth every 6 (six) hours as needed for headache (pain).   acetaminophen-codeine (TYLENOL #3) 300-30 MG tablet Take 1 tablet by mouth every 6 (six) hours as needed (pain).   albuterol (VENTOLIN HFA) 108 (90 Base) MCG/ACT inhaler as needed.   atorvastatin (LIPITOR) 40 MG tablet Take 40 mg by mouth every evening.   B-D UF III MINI PEN NEEDLES 31G X 5 MM MISC USE AS DIRECTED WITH LANTUS SOLOSTAR   Cholecalciferol (VITAMIN D3) 50 MCG (2000 UT) TABS Take 2,000 Units by mouth every evening.   DULoxetine (CYMBALTA) 30 MG capsule Take  30 mg by mouth every morning. Take with a 60 mg capsule for a total morning dose of 90 mg   DULoxetine (CYMBALTA) 60 MG capsule Take 60 mg by mouth every morning. Take with a 30 mg capsule for a total morning dose of 90 mg   ELIQUIS 5 MG TABS tablet TAKE 1 TABLET BY MOUTH TWICE A DAY   fluticasone (FLONASE) 50 MCG/ACT nasal spray as needed.   furosemide (LASIX) 20 MG tablet Take 20 mg by mouth See admin instructions. Take one tablet (20 mg) by mouth up to three times weekly as needed for swelling/fluid.   hydrOXYzine (VISTARIL) 25 MG capsule Take 50 mg by mouth every 8 (eight) hours as needed for itching (sleep).   Insulin Glargine (BASAGLAR KWIKPEN) 100 UNIT/ML SOPN Inject 34 Units into the skin See admin instructions. Inject 34 units subcutaneously every morning   insulin lispro (HUMALOG) 100 UNIT/ML KwikPen Inject 12 Units into the skin 3 (three) times daily.   MAGNESIUM PO Take 350 mg by mouth every evening. Contains blueberry powder 100  mg   Melatonin 3 MG TABS Take 3 mg by mouth daily as needed (for sleep).   Multiple Vitamin (MULTIVITAMIN WITH MINERALS) TABS tablet Take 1 tablet by mouth every morning.   nitroGLYCERIN (NITROSTAT) 0.4 MG SL tablet Place 0.4 mg under the tongue every 5 (five) minutes as needed for chest pain.   ondansetron (ZOFRAN-ODT) 4 MG disintegrating tablet Take 4 mg by mouth every 8 (eight) hours as needed for nausea or vomiting.   ONETOUCH ULTRA test strip FOR USE OF CHEACKING BLOOD ONCE DAILY   pantoprazole (PROTONIX) 40 MG tablet TAKE 1 TABLET BY MOUTH EVERY DAY   zaleplon ([name]) 5 MG capsule 1 at bedtime as needed for sleep   Current Facility-Administered Medications for the 08/06/21 encounter (Office Visit) with Freada Bergeron, MD  Medication   sodium chloride flush (NS) 0.9 % injection 3 mL     Allergies:   Cilostazol, Trulicity [dulaglutide], Levaquin [levofloxacin], Lisinopril, and Victoza [liraglutide]   Social History   Socioeconomic History    Marital status: Married    Spouse name: Not on file   Number of children: Not on file   Years of education: Not on file   Highest education level: Not on file  Occupational History   Occupation: retired  Tobacco Use   Smoking status: Never   Smokeless tobacco: Never  Vaping Use   Vaping Use: Never used  Substance and Sexual Activity   Alcohol use: Not Currently   Drug use: Never   Sexual activity:  Not on file  Other Topics Concern   Not on file  Social History Narrative   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 There is no history of Colon cancer, Esophageal cancer, Prostate cancer, Rectal cancer, Stomach cancer, or Colon polyps.  ROS:   Please see the history of present illness.    Review of Systems  Constitutional:  Negative for chills and fever.  HENT:  Negative for hearing loss and tinnitus.   Eyes:  Negative for blurred vision and double vision.  Respiratory:  Positive for shortness of breath. Negative for cough and hemoptysis.   Cardiovascular:  Positive for palpitations. Negative for chest pain, orthopnea, claudication, leg swelling and PND.  Gastrointestinal:  Negative for heartburn and nausea.  Genitourinary:  Negative for dysuria and urgency.  Musculoskeletal:  Negative for myalgias and neck pain.  Skin:  Negative for itching and rash.  Neurological:  Negative for dizziness and headaches.  Endo/Heme/Allergies:  Negative for environmental allergies. Does not bruise/bleed easily.  Psychiatric/Behavioral:  Negative for depression. The patient has  insomnia.    All other systems reviewed and are negative.  EKGs/Labs/Other Studies Reviewed:    The following studies were reviewed today: Prior CV studies: Cardiac monitor 06/07/21 Patient had a minimum heart rate of 43 bpm, maximum heart rate of 171 bpm (during SVT), and average heart rate of 76 bpm. Predominant underlying rhythm was sinus rhythm. Frequent short runs of supraventricular tachycardia occurred lasting 45 seconds at longest with a max rate of 171 bpm at fastest (most much slower). Isolated PACs were frequent (7.3%). Isolated PVCs were rare (<1.0%). No triggered and diary events. Frequent, asymptomatic PACs and short runs of SVT   R cardiac catheterization 06/06/21 RA = 2 RV = 32/5 PA = 31/11 (19) PCW = 4 Fick cardiac output/index = 4.1/2.1 PVR = 3.6 WU FA sat = 98% PA sat = 68%, 70% PAPi = 10 Assessment: 1. Low filling pressures with moderately reduce CO   Echocardiogram 05/10/21 EF 60-65, mild LVH, GR 1 DD, mildly reduced RVSF, mild LAE, trivial MR, moderate AV calcification, mild AS (mean 11.7, V-max 250 cm/s)   Carotid US 06/21/20 Bilat ICA 1-39   Event monitor 04/2020 Sinus bradycardia with minimum rate 48 BPM, pauses up to 3 seconds even during awake hours. Frequent epsiodes of atrial fibrillation with RVR (2% of the monitoring time).  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    Myoview 03/20/20 EF 65, apical thinning artifact, no ischemia, low risk    Echocardiogram 11/19/2019 EF 60-65, moderate LVH, GR 1 DD, no RWMA, mild AI, mild MR, mildly dilated ascending aorta (39 mm)   LE arterial US 02/11/2019 Right PTA and ATA occluded; left ATA occluded   Myoview 10/21/2018 Low risk, mildly abnormal stress nuclear study with apical thinning but no ischemia; EF 52 with mild global hypokinesis    Holter 06/11/18 Very frequent supraventricular ectopy, contributing to 15% of overall beats. Very frequent  couplets and a very short runs of  atrial tachycardia.   Echocardiogram 10/23/2015 EF 55-60, normal wall motion, mildly calcified aortic valve leaflets, mild AI, mild to moderate MR   Cardiac catheterization 10/22/2015 LM ostial 50 LAD mid 90 RI ostial 90 LCx proximal 80; OM1 80; OM2 100 (culprit for MI) RCA mid 50 LIMA-LAD patent SVG-RPDA patent SVG-D1 patent SVG-OM1 100 SVG-OM2 100 Medical therapy   Pre-CABG Dopplers 09/28/2015 Summary: Findings suggest 1-39% internal carotid artery stenosis bilaterally. Vertebral arteries are patent with antegrade flow. Bilateral ABIs are within normal limits.    EKG:   08/06/21: EKG was not ordered today  Recent Labs: 05/10/2021: ALT 21; B Natriuretic Peptide 213.1 05/31/2021: BUN 44; Creatinine, Ser 2.07; Magnesium 2.0; Platelets 251 06/06/2021: Hemoglobin 14.6; Hemoglobin 15.3; Potassium 4.1; Potassium 4.6; Sodium 140; Sodium 138 06/26/2021: NT-Pro BNP 627  Recent Lipid Panel    Component Value Date/Time   CHOL 129 06/11/2020 0915   TRIG 84 06/11/2020 0915   HDL 41 06/11/2020 0915   CHOLHDL 3.1 06/11/2020 0915   CHOLHDL 2.7 10/23/2015 0515   VLDL 20 10/23/2015 0515   LDLCALC 72 06/11/2020 0915     Risk Assessment/Calculations:    CHA2DS2-VASc Score = 6  { This indicates a 9.7% annual risk of stroke. The patient's score is based upon: CHF History: 1 HTN History: 1 Diabetes History: 1 Stroke History: 0 Vascular Disease History: 1 Age Score: 2 Gender Score: 0         Physical Exam:    VS:  BP 128/78   Pulse 67   Ht 5\' 6"  (1.676 m)   Wt 197 lb 9.6 oz (89.6 kg)   SpO2 97%   BMI 31.89 kg/m     Wt Readings from Last 3 Encounters:  08/06/21 197 lb 9.6 oz (89.6 kg)  06/26/21 191 lb 9.6 oz (86.9 kg)  06/18/21 188 lb 12.8 oz (85.6 kg)     GEN:  Well nourished, well developed in no acute distress HEENT: Normal NECK: No JVD; No carotid bruits CARDIAC: RRR, no murmurs, rubs, gallops RESPIRATORY:  Tachypneic but clear to  auscultation without rales, wheezing or rhonchi. Fair air movement. ABDOMEN: Soft, non-tender, non-distended MUSCULOSKELETAL:  No edema; No deformity  SKIN: Warm and dry NEUROLOGIC:  Alert and oriented x 3 PSYCHIATRIC:  Normal affect   ASSESSMENT:    1. Pre-operative clearance   2. Coronary artery disease involving coronary bypass graft of native heart without angina pectoris   3. Coronary artery disease involving native coronary artery of native heart without angina pectoris   4. S/P CABG x 5    PLAN:    In order of problems listed above:  #Pre-operative Evaluation: Patient plans to undergo surgery with ENT for deviated septum in hopes this will help with chronic SOB. He is s/p CABG with 2 grafts known occluded on medical management. Unable to perform >4METs due to chronic dyspnea. Last myoview 03/2020 low risk without ischemia or infarction. Will repeat myoview for pre-op clearance. -Check myoview -Will discuss [facility] plan once surgery sends pre-op clearance  #Multivessel CAD s/p CABG in 2017: Myoview 03/2020 with no evidence of ischemia or infarction. TTE 05/10/21 with EF 60-65, mild LVH, GR 1 DD, mildly reduced RVSF, mild LAE, trivial MR, moderate AV calcification, mild AS (mean 11.7, V-max 250 cm/s). No chest pain currently.  -Not on ASA due to need for apixaban -Continue lipitor 40mg  daily -Not on BB due to episodes of bradycardia -Not on ACE/ARB due to CKD stage IV  #Paroxysmal Afib: #Tachy-brady syndrome CHADs-vasc  6 on apixaban. Tolerating AC without issues. Has declined loop monitor. -Continue apixaban 5mg  BID -Declined loop  #Chronic HFpEF: Normal filling pressures on RHC in 04/2021. Compensated and euvolemic on exam. SOB does not correlate with elevated filling pressures and patient is now seeing ENT and pulmonary -Continue lasix 3x/week -Not on ACE/ARB/spiro due to CKD IV  #Chronic SOB: Unclear etiology. Reassuring RHC with normal EF and filling pressures. Most  recent myoview 03/2020 without evidence of infarct or ischemia. Possibly related to deviated septum per ENT. Plans for surgical intervention. -Follow-up with ENT as scheduled -Follow-up with pulm  #CKD Stage IV: Follows with Dr. Justin Mend.  #HLD: -Continue lipitor -Goal LDL<70     Shared Decision Making/Informed Consent The risks [chest pain, shortness of breath, cardiac arrhythmias, dizziness, blood pressure fluctuations, myocardial infarction, stroke/transient ischemic attack, nausea, vomiting, allergic reaction, radiation exposure, metallic taste sensation and life-threatening complications (estimated to be 1 in 10,000)], benefits (risk stratification, diagnosing coronary artery disease, treatment guidance) and alternatives of a nuclear stress test were discussed in detail with Fernando Hess and he agrees to proceed.    Medication Adjustments/Labs and Tests Ordered: Current medicines are reviewed at length with the patient today.  Concerns regarding medicines are outlined above.  Orders Placed This Encounter  Procedures   MYOCARDIAL PERFUSION IMAGING    No orders of the defined types were placed in this encounter.   Patient Instructions  Medication Instructions:   Your physician recommends that you continue on your current medications as directed. Please refer to the Current Medication list given to you today.  *If you need a refill on your cardiac medications before your next appointment, please call your pharmacy*   Testing/Procedures:  Your physician has requested that you have a lexiscan myoview. For further information please visit [email or web address]. Please follow instruction sheet, as given.    Follow-Up:  3 MONTHS IN THE OFFICE WITH SCOTT WEAVER [name]    Other Instructions  PLEASE CALL YOUR SURGEONS OFFICE THIS WEEK AND HAVE THEM FAX OVER A SURGICAL CLEARANCE FORM FOR OUR OFFICE TO ADVISE ON--HAVE THEM FAX THIS TO DR. Johney Frame AT [phone]   Wilhemina Bonito as a scribe for Freada Bergeron, MD.,have documented all relevant documentation on the behalf of Freada Bergeron, MD,as directed by  Freada Bergeron, MD while in the presence of Freada Bergeron, MD.  I, Freada Bergeron, MD, have reviewed all documentation for this visit. The documentation on 08/06/21 for the exam, diagnosis, procedures, and orders are all accurate and complete.   Signed, Freada Bergeron, MD  08/06/2021 10:24 AM    [place]

## 2021-08-07 ENCOUNTER — Telehealth: Payer: Self-pay | Admitting: Cardiology

## 2021-08-07 ENCOUNTER — Other Ambulatory Visit: Payer: Self-pay | Admitting: Otolaryngology

## 2021-08-07 ENCOUNTER — Telehealth (HOSPITAL_COMMUNITY): Payer: Self-pay | Admitting: *Deleted

## 2021-08-07 NOTE — Telephone Encounter (Signed)
Left message on voicemail per DPR in reference to upcoming appointment scheduled on 08/16/2021 at 7:30 with detailed instructions given per Myocardial Perfusion Study Information Sheet for the test. LM to arrive 15 minutes early, and that it is imperative to arrive on time for appointment to keep from having the test rescheduled. If you need to cancel or reschedule your appointment, please call the office within 24 hours of your appointment. Failure to do so may result in a cancellation of your appointment, and a $50 no show fee. Phone number given for call back for any questions.

## 2021-08-07 NOTE — Telephone Encounter (Signed)
Patient with diagnosis of afib on Eliquis for anticoagulation.    Procedure: Septoplasty & Bilateral Inferior Turbinate Reductions Date of procedure: 08/30/21  CHA2DS2-VASc Score = 6  This indicates a 9.7% annual risk of stroke. The patient's score is based upon: CHF History: 1 HTN History: 1 Diabetes History: 1 Stroke History: 0 Vascular Disease History: 1 Age Score: 2 Gender Score: 0   CrCl 38mL/min Platelet count 251K  Per office protocol, patient can hold Eliquis for 2-3 days prior to procedure.

## 2021-08-07 NOTE — Telephone Encounter (Signed)
Patient was seen by Dr. Johney Frame for pre-op evaluation yesterday. Patient was unable to complete 4.0 METS due to chronic dyspnea. Therefore, Myoview was ordered. This is scheduled for 08/16/2021. Will wait for these results before completing pre-op risk assessment.  Pharmacy, can you please comment on how long Eliquis can be held for this procedure?  Thank you!

## 2021-08-07 NOTE — Telephone Encounter (Signed)
   [facility] Pre-operative Risk Assessment    Request for surgical clearance:  What type of surgery is being performed?  Septoplasty & Bilateral Inferior Turbinate Reductions   When is this surgery scheduled?  08/30/21   What type of clearance is required (medical clearance vs. Pharmacy clearance to hold med vs. Both)?  Both   Are there any medications that need to be held prior to surgery and how long? Their office is requesting our recommendation on holding anticoagulation therapy    Practice name and name of physician performing surgery?  [facility]  Dr. Remer Macho   What is your office phone number? [phone]   7.   What is your office fax number? [phone] (attn: Carla)   8.   Anesthesia type (None, local, MAC, general) ?  General   Fernando Hess 08/07/2021, 12:59 PM

## 2021-08-16 ENCOUNTER — Ambulatory Visit (HOSPITAL_COMMUNITY): Payer: Medicare Other | Attending: Cardiology

## 2021-08-16 ENCOUNTER — Other Ambulatory Visit: Payer: Self-pay

## 2021-08-16 DIAGNOSIS — Z951 Presence of aortocoronary bypass graft: Secondary | ICD-10-CM | POA: Insufficient documentation

## 2021-08-16 DIAGNOSIS — I25810 Atherosclerosis of coronary artery bypass graft(s) without angina pectoris: Secondary | ICD-10-CM

## 2021-08-16 DIAGNOSIS — Z01818 Encounter for other preprocedural examination: Secondary | ICD-10-CM | POA: Insufficient documentation

## 2021-08-16 DIAGNOSIS — Z01810 Encounter for preprocedural cardiovascular examination: Secondary | ICD-10-CM

## 2021-08-16 DIAGNOSIS — I2510 Atherosclerotic heart disease of native coronary artery without angina pectoris: Secondary | ICD-10-CM | POA: Insufficient documentation

## 2021-08-16 LAB — MYOCARDIAL PERFUSION IMAGING
LV dias vol: 79 mL (ref 62–150)
LV sys vol: 35 mL
Nuc Stress EF: 55 %
Peak HR: 89 {beats}/min
Rest HR: 71 {beats}/min
Rest Nuclear Isotope Dose: 9.9 mCi
SDS: 1
SRS: 3
SSS: 4
ST Depression (mm): 0 mm
Stress Nuclear Isotope Dose: 32.2 mCi
TID: 1.05

## 2021-08-16 MED ORDER — TECHNETIUM TC 99M TETROFOSMIN IV KIT
9.9000 | PACK | Freq: Once | INTRAVENOUS | Status: AC | PRN
  Administered 2021-08-16: 9.9 via INTRAVENOUS
  Filled 2021-08-16: qty 10

## 2021-08-16 MED ORDER — REGADENOSON 0.4 MG/5ML IV SOLN
0.4000 mg | Freq: Once | INTRAVENOUS | Status: AC
  Administered 2021-08-16: 0.4 mg via INTRAVENOUS

## 2021-08-16 MED ORDER — TECHNETIUM TC 99M TETROFOSMIN IV KIT
32.2000 | PACK | Freq: Once | INTRAVENOUS | Status: AC | PRN
  Administered 2021-08-16: 32.2 via INTRAVENOUS
  Filled 2021-08-16: qty 33

## 2021-08-20 NOTE — Telephone Encounter (Signed)
   Name: Fernando Hess  DOB: 10/03/1945  MRN: [id number]   Primary Cardiologist: Freada Bergeron, MD  Chart reviewed as part of pre-operative protocol coverage. Patient was contacted 08/20/2021 in reference to pre-operative risk assessment for pending surgery as outlined below.  Fernando Hess was last seen on 08/06/21 by Dr. Johney Frame.  Since that day, Fernando Hess has done well. Due to symptoms, nuclear stress test was ordered and showed no evidence of ischemia.   Per our clinical pharmacist: Per office protocol, patient can hold Eliquis for 2-3 days prior to procedure  Therefore, based on ACC/AHA guidelines, the patient would be at acceptable risk for the planned procedure without further cardiovascular testing.   The patient was advised that if he develops new symptoms prior to surgery to contact our office to arrange for a follow-up visit, and he verbalized understanding.  I will route this recommendation to the requesting party via Epic fax function and remove from pre-op pool. Please call with questions.  Tami Lin [place], PA 08/20/2021, 9:22 AM

## 2021-08-26 NOTE — Progress Notes (Signed)
Surgical Instructions    Your procedure is scheduled on 08/30/21.  Report to [facility] Main Entrance "A" at 8:15 A.M., then check in with the Admitting office.  Call this number if you have problems the morning of surgery:  [phone]   If you have any questions prior to your surgery date call [phone]: Open Monday-Friday 8am-4pm    Remember:  Do not eat or drink after midnight the night before your surgery      Take these medicines the morning of surgery with A SIP OF WATER  DULoxetine (CYMBALTA)  pantoprazole (PROTONIX)   IF NEEDED: acetaminophen (TYLENOL)  hydrOXYzine (VISTARIL)  insulin lispro (HUMALOG)   As of today, STOP taking any Aspirin (unless otherwise instructed by your surgeon) Aleve, Naproxen, Ibuprofen, Motrin, Advil, Goody's, BC's, all herbal medications, fish oil, and all vitamins.  Please hold Eliquis for 2-3 days prior to procedure. Your last dose will 08/26/21.  WHAT DO I DO ABOUT MY DIABETES MEDICATION?   Do not take oral diabetes medicines (pills) the morning of surgery.   The day of surgery, do not take other diabetes injectables, including Byetta (exenatide), Bydureon (exenatide ER), Victoza (liraglutide), or Trulicity (dulaglutide).  If your CBG is greater than 220 mg/dL, you may take  of your sliding scale (insulin lispro (HUMALOG) dose of insulin.   HOW TO MANAGE YOUR DIABETES BEFORE AND AFTER SURGERY  Why is it important to control my blood sugar before and after surgery? Improving blood sugar levels before and after surgery helps healing and can limit problems. A way of improving blood sugar control is eating a healthy diet by:  Eating less sugar and carbohydrates  Increasing activity/exercise  Talking with your doctor about reaching your blood sugar goals High blood sugars (greater than 180 mg/dL) can raise your risk of infections and slow your recovery, so you will need to focus on controlling your diabetes during the weeks  before surgery. Make sure that the doctor who takes care of your diabetes knows about your planned surgery including the date and location.  How do I manage my blood sugar before surgery? Check your blood sugar at least 4 times a day, starting 2 days before surgery, to make sure that the level is not too high or low.  Check your blood sugar the morning of your surgery when you wake up and every 2 hours until you get to the Short Stay unit.  If your blood sugar is less than 70 mg/dL, you will need to treat for low blood sugar: Do not take insulin. Treat a low blood sugar (less than 70 mg/dL) with  cup of clear juice (cranberry or apple), 4 glucose tablets, OR glucose gel. Recheck blood sugar in 15 minutes after treatment (to make sure it is greater than 70 mg/dL). If your blood sugar is not greater than 70 mg/dL on recheck, call [phone] for further instructions. Report your blood sugar to the short stay nurse when you get to Short Stay.  If you are admitted to the hospital after surgery: Your blood sugar will be checked by the staff and you will probably be given insulin after surgery (instead of oral diabetes medicines) to make sure you have good blood sugar levels. The goal for blood sugar control after surgery is 80-180 mg/dL.  After your COVID test   You are not required to quarantine however you are required to wear a well-fitting mask when you are out and around people not in your household.  If your  mask becomes wet or soiled, replace with a new one.  Wash your hands often with soap and water for 20 seconds or clean your hands with an alcohol-based hand sanitizer that contains at least 60% alcohol.  Do not share personal items.  Notify your provider: if you are in close contact with someone who has COVID  or if you develop a fever of 100.4 or greater, sneezing, cough, sore throat, shortness of breath or body aches.             Do not wear jewelry or makeup Do not wear  lotions, powders, perfumes/colognes, or deodorant. Men may shave face and neck. Do not bring valuables to the hospital. DO Not wear nail polish, gel polish, artificial nails, or any other type of covering on natural nails including finger and toenails. If patients have artificial nails, gel coating, etc. that need to be removed by a nail salon, please have this removed prior to surgery or surgery may need to be canceled/delayed if the surgeon/ anesthesia feels like the patient is unable to be adequately monitored.             [facility] is not responsible for any belongings or valuables.  Do NOT Smoke (Tobacco/Vaping)  24 hours prior to your procedure  If you use a CPAP at night, you may bring your mask for your overnight stay.   Contacts, glasses, hearing aids, dentures or partials may not be worn into surgery, please bring cases for these belongings   For patients admitted to the hospital, discharge time will be determined by your treatment team.   Patients discharged the day of surgery will not be allowed to drive home, and someone needs to stay with them for 24 hours.  NO VISITORS WILL BE ALLOWED IN PRE-OP WHERE PATIENTS ARE PREPPED FOR SURGERY.  ONLY 1 SUPPORT PERSON MAY BE PRESENT IN THE WAITING ROOM WHILE YOU ARE IN SURGERY.  IF YOU ARE TO BE ADMITTED, ONCE YOU ARE IN YOUR ROOM YOU WILL BE ALLOWED TWO (2) VISITORS. 1 (ONE) VISITOR MAY STAY OVERNIGHT BUT MUST ARRIVE TO THE ROOM BY 8pm.  Minor children may have two parents present. Special consideration for safety and communication needs will be reviewed on a case by case basis.  Special instructions:    Oral Hygiene is also important to reduce your risk of infection.  Remember - BRUSH YOUR TEETH THE MORNING OF SURGERY WITH YOUR REGULAR TOOTHPASTE   [place]- Preparing For Surgery  Before surgery, you can play an important role. Because skin is not sterile, your skin needs to be as free of germs as possible. You can reduce the  number of germs on your skin by washing with CHG (chlorahexidine gluconate) Soap before surgery.  CHG is an antiseptic cleaner which kills germs and bonds with the skin to continue killing germs even after washing.     Please do not use if you have an allergy to CHG or antibacterial soaps. If your skin becomes reddened/irritated stop using the CHG.  Do not shave (including legs and underarms) for at least 48 hours prior to first CHG shower. It is OK to shave your face.  Please follow these instructions carefully.     Shower the NIGHT BEFORE SURGERY and the MORNING OF SURGERY with CHG Soap.   If you chose to wash your hair, wash your hair first as usual with your normal shampoo. After you shampoo, rinse your hair and body thoroughly to remove the shampoo.  Then [employer] and genitals (private parts) with your normal soap and rinse thoroughly to remove soap.  After that Use CHG Soap as you would any other liquid soap. You can apply CHG directly to the skin and wash gently with a scrungie or a clean washcloth.   Apply the CHG Soap to your body ONLY FROM THE NECK DOWN.  Do not use on open wounds or open sores. Avoid contact with your eyes, ears, mouth and genitals (private parts). Wash Face and genitals (private parts)  with your normal soap.   Wash thoroughly, paying special attention to the area where your surgery will be performed.  Thoroughly rinse your body with warm water from the neck down.  DO NOT shower/wash with your normal soap after using and rinsing off the CHG Soap.  Pat yourself dry with a CLEAN TOWEL.  Wear CLEAN PAJAMAS to bed the night before surgery  Place CLEAN SHEETS on your bed the night before your surgery  DO NOT SLEEP WITH PETS.   Day of Surgery: Take a shower with CHG soap. Wear Clean/Comfortable clothing the morning of surgery Do not apply any deodorants/lotions.   Remember to brush your teeth WITH YOUR REGULAR TOOTHPASTE.   Please read over the following  fact sheets that you were given.

## 2021-08-27 ENCOUNTER — Encounter (HOSPITAL_COMMUNITY)
Admission: RE | Admit: 2021-08-27 | Discharge: 2021-08-27 | Disposition: A | Discharge status: Home / Self Care | Payer: Medicare Other | Source: Ambulatory Visit | Attending: Otolaryngology | Admitting: Otolaryngology

## 2021-08-27 ENCOUNTER — Encounter (HOSPITAL_COMMUNITY): Payer: Self-pay

## 2021-08-27 ENCOUNTER — Other Ambulatory Visit: Payer: Self-pay

## 2021-08-27 VITALS — BP 128/66 | HR 83 | Temp 98.2°F | Resp 17 | Ht 66.0 in | Wt 193.6 lb

## 2021-08-27 DIAGNOSIS — Z7901 Long term (current) use of anticoagulants: Secondary | ICD-10-CM | POA: Insufficient documentation

## 2021-08-27 DIAGNOSIS — Z01812 Encounter for preprocedural laboratory examination: Secondary | ICD-10-CM | POA: Diagnosis present

## 2021-08-27 DIAGNOSIS — G4733 Obstructive sleep apnea (adult) (pediatric): Secondary | ICD-10-CM | POA: Insufficient documentation

## 2021-08-27 DIAGNOSIS — N183 Chronic kidney disease, stage 3 unspecified: Secondary | ICD-10-CM | POA: Diagnosis not present

## 2021-08-27 DIAGNOSIS — I5032 Chronic diastolic (congestive) heart failure: Secondary | ICD-10-CM | POA: Insufficient documentation

## 2021-08-27 DIAGNOSIS — Z951 Presence of aortocoronary bypass graft: Secondary | ICD-10-CM | POA: Insufficient documentation

## 2021-08-27 DIAGNOSIS — I130 Hypertensive heart and chronic kidney disease with heart failure and stage 1 through stage 4 chronic kidney disease, or unspecified chronic kidney disease: Secondary | ICD-10-CM | POA: Insufficient documentation

## 2021-08-27 DIAGNOSIS — I739 Peripheral vascular disease, unspecified: Secondary | ICD-10-CM | POA: Insufficient documentation

## 2021-08-27 DIAGNOSIS — Z01818 Encounter for other preprocedural examination: Secondary | ICD-10-CM

## 2021-08-27 DIAGNOSIS — I4891 Unspecified atrial fibrillation: Secondary | ICD-10-CM | POA: Insufficient documentation

## 2021-08-27 DIAGNOSIS — E1122 Type 2 diabetes mellitus with diabetic chronic kidney disease: Secondary | ICD-10-CM | POA: Insufficient documentation

## 2021-08-27 DIAGNOSIS — I2510 Atherosclerotic heart disease of native coronary artery without angina pectoris: Secondary | ICD-10-CM | POA: Insufficient documentation

## 2021-08-27 DIAGNOSIS — I10 Essential (primary) hypertension: Secondary | ICD-10-CM

## 2021-08-27 HISTORY — DX: Heart failure, unspecified: I50.9

## 2021-08-27 HISTORY — DX: Dyspnea, unspecified: R06.00

## 2021-08-27 LAB — CBC
HCT: 44.3 % (ref 39.0–52.0)
Hemoglobin: 14.5 g/dL (ref 13.0–17.0)
MCH: 29.8 pg (ref 26.0–34.0)
MCHC: 32.7 g/dL (ref 30.0–36.0)
MCV: 91.2 fL (ref 80.0–100.0)
Platelets: 247 10*3/uL (ref 150–400)
RBC: 4.86 MIL/uL (ref 4.22–5.81)
RDW: 13.5 % (ref 11.5–15.5)
WBC: 11.9 10*3/uL — ABNORMAL HIGH (ref 4.0–10.5)
nRBC: 0 % (ref 0.0–0.2)

## 2021-08-27 LAB — HEMOGLOBIN A1C
Hgb A1c MFr Bld: 11 % — ABNORMAL HIGH (ref 4.8–5.6)
Mean Plasma Glucose: 269 mg/dL

## 2021-08-27 LAB — BASIC METABOLIC PANEL
Anion gap: 8 (ref 5–15)
BUN: 23 mg/dL (ref 8–23)
CO2: 26 mmol/L (ref 22–32)
Calcium: 8.9 mg/dL (ref 8.9–10.3)
Chloride: 104 mmol/L (ref 98–111)
Creatinine, Ser: 1.91 mg/dL — ABNORMAL HIGH (ref 0.61–1.24)
GFR, Estimated: 36 mL/min — ABNORMAL LOW (ref >60)
Glucose, Bld: 127 mg/dL — ABNORMAL HIGH (ref 70–99)
Potassium: 3.8 mmol/L (ref 3.5–5.1)
Sodium: 138 mmol/L (ref 135–145)

## 2021-08-27 LAB — GLUCOSE, CAPILLARY: Glucose-Capillary: 162 mg/dL — ABNORMAL HIGH (ref 70–99)

## 2021-08-27 NOTE — Progress Notes (Signed)
Office notified and shoemaker IBM'd about A1C resultl.

## 2021-08-27 NOTE — Progress Notes (Addendum)
PCP - Lona Kettle Cardiologist - Gwyndolyn Kaufman Pulmonologist: Baird Lyons Nephrologist: Dr. Justin Mend  PPM/ICD - denies   Chest x-ray - n/a EKG - 06/26/21 Stress Test - 08/16/21 ECHO - 05/10/21 Cardiac Cath - 06/06/21  Sleep Study - +OSA CPAP - autotitrate, wears nightly  Fasting Blood Sugar - 170 Checks Blood Sugar 4-6 times a day  As of today, STOP taking any Aspirin (unless otherwise instructed by your surgeon) Aleve, Naproxen, Ibuprofen, Motrin, Advil, Goody's, BC's, all herbal medications, fish oil, and all vitamins.   Please hold Eliquis for 2-3 days prior to procedure. Your last dose will 08/26/21.  ERAS Protcol -NPO   COVID TEST- ambulatory surgery,  not needed   Anesthesia review: yes, cardiac history with pre op clearance note in Epic. A1c 11  Patient denies shortness of breath, fever, cough and chest pain at PAT appointment   All instructions explained to the patient, with a verbal understanding of the material. Patient agrees to go over the instructions while at home for a better understanding. Patient also instructed to self quarantine after being tested for COVID-19. The opportunity to ask questions was provided.

## 2021-08-28 NOTE — Progress Notes (Signed)
Anesthesia Chart Review:  Follows with cardiology for history of CAD s/p CABG 2017, HFpEF, paroxysmal A. fib on Eliquis, mild AS, HTN, HLD, PAD.  Cardiac clearance per telephone encounter 08/20/21, "Chart reviewed as part of pre-operative protocol coverage. Patient was contacted 08/20/2021 in reference to pre-operative risk assessment for pending surgery as outlined below.  Fernando Hess was last seen on 08/06/21 by Dr. Johney Frame.  Since that day, Fernando Hess has done well. Due to symptoms, nuclear stress test was ordered and showed no evidence of ischemia. Per our clinical pharmacist: Per office protocol, patient can hold Eliquis for 2-3 days prior to procedure. Therefore, based on ACC/AHA guidelines, the patient would be at acceptable risk for the planned procedure without further cardiovascular testing."  LD Eliquis 08/26/21.  OSA on CPAP.  CKD 3.  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 PAT RN called this result to surgeon's office.   Preop labs reviewed, creatinine elevated 1.91 c/w history of CKD 3, otherwise unremarkable.  EKG 06/26/21: Sinus rhythm with respiratory sinus arrhythmia. Rate 87. PACs. RBBB.  CHEST - 2 VIEW 07/05/2021: COMPARISON:  05/10/2021   FINDINGS: Cardiomediastinal silhouette unchanged in size and contour. No evidence of central vascular congestion. No interlobular septal thickening.   Surgical changes of median sternotomy and CABG   No pneumothorax or pleural effusion. Coarsened interstitial markings, with no confluent airspace disease.   No acute displaced fracture. Degenerative changes of the spine.   IMPRESSION: Negative for acute cardiopulmonary disease  Nuclear stress 08/16/21:   The patient reported no symptoms during the stress test.   ECG rhythm shows normal sinus rhythm. ECG demonstrates incomplete right bundle branch block. The ECG shows premature atrial contractions.   Arrhythmias during stress: occasional PACs. Arrhythmias during  recovery: occasional PACs. ECG was interpretable and without significant changes. The ECG was not diagnostic due to pharmacologic protocol.   Overall image quality is good. Diaphragmatic attenuation artifact was present. Image quality affected due to significant extracardiac activity.   LV perfusion is normal. There is no evidence of ischemia. There is no evidence of infarction.   Nuclear stress EF: 55 %. The left ventricular ejection fraction is normal (55-65%). No evidence of transient ischemic dilation (TID) noted.   Prior study available for comparison from 03/20/2020.  No change from prior study.   Event monitor 06/07/21: Patient had a minimum heart rate of 43 bpm, maximum heart rate of 171 bpm (during SVT), and average heart rate of 76 bpm. Predominant underlying rhythm was sinus rhythm. Frequent short runs of supraventricular tachycardia occurred lasting 45 seconds at longest with a max rate of 171 bpm at fastest (most much slower). Isolated PACs were frequent (7.3%). Isolated PVCs were rare (<1.0%). No triggered and diary events.   Frequent, asymptomatic PACs and short runs of SVT.  [place] 06/06/21: Assessment: 1. Low filling pressures with moderately reduce CO   Plan/Discussion:    Continue medical therapy   TTE 05/10/21:  1. Left ventricular ejection fraction, by estimation, is 60 to 65%. The  left ventricle has normal function. Left ventricular endocardial border  not optimally defined to evaluate regional wall motion. There is mild left  ventricular hypertrophy. Left  ventricular diastolic parameters are consistent with Grade I diastolic  dysfunction (impaired relaxation).   2. Right ventricular systolic function is mildly reduced. The right  ventricular size is mildly enlarged. Tricuspid regurgitation signal is  inadequate for assessing PA pressure.   3. Left atrial size was mildly dilated.  4. The mitral valve is degenerative. Trivial mitral valve regurgitation.  No  evidence of mitral stenosis.   5. The aortic valve is abnormal. There is moderate calcification of the  aortic valve. There is moderate thickening of the aortic valve. Aortic  valve regurgitation is trivial. Mild aortic valve stenosis. Aortic valve  area, by VTI measures 1.81 cm.  Aortic valve mean gradient measures 11.7 mmHg. Aortic valve Vmax measures  2.50 m/s.   6. The inferior vena cava is normal in size with greater than 50%  respiratory variability, suggesting right atrial pressure of 3 mmHg.    Wynonia Musty [facility] Short Stay Center/Anesthesiology Phone [phone] 08/28/2021 2:07 PM

## 2021-08-28 NOTE — Anesthesia Preprocedure Evaluation (Addendum)
Anesthesia Evaluation  Patient identified by MRN, date of birth, ID band Patient awake    Reviewed: Allergy & Precautions, NPO status , Patient's Chart, lab work & pertinent test results, reviewed documented beta blocker date and time   Airway Mallampati: II  TM Distance: >3 FB Neck ROM: Full    Dental no notable dental hx. (+) Teeth Intact, Caps, Dental Advisory Given   Pulmonary shortness of breath and with exertion, sleep apnea and Continuous Positive Airway Pressure Ventilation ,  Nasal septal deviation Inferior turbinate hypertrophy   Pulmonary exam normal breath sounds clear to auscultation       Cardiovascular hypertension, Pt. on medications + CAD, + Past MI, + CABG, + Peripheral Vascular Disease and +CHF  + dysrhythmias + Valvular Problems/Murmurs AS  Rhythm:Irregular Rate:Normal  hx NSTEMI 09-24-2015  s/p  CABG x5 on 10-02-2015;  post op STEMI inferolateral wall,  SVG OM1 and SVG OM2 occluded, distal OM occlusion the calpruit, treated medically // Myoview 7/21: no ischemia, EF 65, low risk  Echo 05/10/21 1. Left ventricular ejection fraction, by estimation, is 60 to 65%. The left ventricle has normal function. Left ventricular endocardial border not optimally defined to evaluate regional wall motion. There is mild left ventricular hypertrophy. Left ventricular diastolic parameters are consistent with Grade I diastolic dysfunction (impaired relaxation).  2. Right ventricular systolic function is mildly reduced. The right ventricular size is mildly enlarged. Tricuspid regurgitation signal is inadequate for assessing PA pressure.  3. Left atrial size was mildly dilated.  4. The mitral valve is degenerative. Trivial mitral valve regurgitation. No evidence of mitral stenosis.  5. The aortic valve is abnormal. There is moderate calcification of the aortic valve. There is moderate thickening of the aortic valve. Aortic valve  regurgitation is trivial. Mild aortic valve stenosis. Aortic valve area, by VTI measures 1.81 cm. Aortic valve mean gradient measures 11.7 mmHg. Aortic valve Vmax measures 2.50 m/s.  6. The inferior vena cava is normal in size with greater than 50% respiratory variability, suggesting right atrial pressure of 3 mmHg.  EKG 06/26/21 NSR with sinus arrythymia. RBBB pattern   Neuro/Psych  Headaches, negative psych ROS   GI/Hepatic Neg liver ROS, GERD  Medicated and Controlled,  Endo/Other  diabetes, Poorly Controlled, Type 2, Insulin DependentHyperlipidemia  Renal/GU Renal InsufficiencyRenal disease  negative genitourinary   Musculoskeletal negative musculoskeletal ROS (+)   Abdominal (+) + obese,   Peds  Hematology Eliquis therapy- last dose 5 days ago   Anesthesia Other Findings   Reproductive/Obstetrics                            Anesthesia Physical Anesthesia Plan  ASA: 3  Anesthesia Plan: General   Post-op Pain Management:    Induction: Intravenous  PONV Risk Score and Plan: 3 and Treatment may vary due to age or medical condition, Ondansetron and Dexamethasone  Airway Management Planned: Oral ETT  Additional Equipment:   Intra-op Plan:   Post-operative Plan:   Informed Consent: I have reviewed the patients History and Physical, chart, labs and discussed the procedure including the risks, benefits and alternatives for the proposed anesthesia with the patient or authorized representative who has indicated his/her understanding and acceptance.     Dental advisory given  Plan Discussed with: CRNA and Anesthesiologist  Anesthesia Plan Comments: (PAT note by Karoline Caldwell, [name]: Follows with cardiology for history of CAD s/p CABG 2017, HFpEF, paroxysmal A. fib on Eliquis, mild AS,  HTN, HLD, PAD.  Cardiac clearance per telephone encounter 08/20/21, "Chart reviewed as part of pre-operative protocol coverage. Patient was contacted12/6/2022in  reference to pre-operative risk assessment for pending surgery as outlined below. Fernando Balles Canterwas last seen on11/22/22by Dr. Johney Frame. Since that day, MARCAS Hess done well.Due to symptoms, nuclear stress test was ordered and showed no evidence of ischemia. Per our clinical pharmacist: Per office protocol, patient can holdEliquisfor 2-3days prior to procedure. Therefore, based on ACC/AHA guidelines, the patient would be at acceptable risk for the planned procedure without further cardiovascular testing."  LD Eliquis 08/26/21.  OSA on CPAP.  CKD 3.  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 PAT RN called this result to surgeon's office.   Preop labs reviewed, creatinine elevated 1.91 c/w history of CKD 3, otherwise unremarkable.  EKG 06/26/21: Sinus rhythm with respiratory sinus arrhythmia. Rate 87. PACs. RBBB.  CHEST - 2 VIEW 07/05/2021: COMPARISON: 05/10/2021  FINDINGS: Cardiomediastinal silhouette unchanged in size and contour. No evidence of central vascular congestion. No interlobular septal thickening.  Surgical changes of median sternotomy and CABG  No pneumothorax or pleural effusion. Coarsened interstitial markings, with no confluent airspace disease.  No acute displaced fracture. Degenerative changes of the spine.  IMPRESSION: Negative for acute cardiopulmonary disease  Nuclear stress 08/16/21:  The patient reported no symptoms during the stress test.  ECG rhythm shows normal sinus rhythm. ECG demonstrates incomplete right bundle branch block. The ECG shows premature atrial contractions.  Arrhythmias during stress: occasional PACs. Arrhythmias during recovery: occasional PACs. ECG was interpretable and without significant changes. The ECG was not diagnostic due to pharmacologic protocol.  Overall image quality is good. Diaphragmatic attenuation artifact was present. Image quality affected due to significant extracardiac activity.  LV  perfusion is normal. There is no evidence of ischemia. There is no evidence of infarction.  Nuclear stress EF: 55 %. The left ventricular ejection fraction is normal (55-65%). No evidence of transient ischemic dilation (TID) noted.  Prior study available for comparison from 03/20/2020. No change from prior study.  Event monitor 06/07/21:  Patient had a minimum heart rate of 43 bpm, maximum heart rate of 171 bpm (during SVT), and average heart rate of 76 bpm.  Predominant underlying rhythm was sinus rhythm.  Frequent short runs of supraventricular tachycardia occurred lasting 45 seconds at longest with a max rate of 171 bpm at fastest (most much slower).  Isolated PACs were frequent (7.3%).  Isolated PVCs were rare (<1.0%).  No triggered and diary events.  Frequent, asymptomatic PACs and short runs of SVT.  [place] 06/06/21: Assessment: 1. Low filling pressures with moderately reduce CO  Plan/Discussion:  Continue medical therapy   TTE 05/10/21: 1. Left ventricular ejection fraction, by estimation, is 60 to 65%. The  left ventricle has normal function. Left ventricular endocardial border  not optimally defined to evaluate regional wall motion. There is mild left  ventricular hypertrophy. Left  ventricular diastolic parameters are consistent with Grade I diastolic  dysfunction (impaired relaxation).  2. Right ventricular systolic function is mildly reduced. The right  ventricular size is mildly enlarged. Tricuspid regurgitation signal is  inadequate for assessing PA pressure.  3. Left atrial size was mildly dilated.  4. The mitral valve is degenerative. Trivial mitral valve regurgitation.  No evidence of mitral stenosis.  5. The aortic valve is abnormal. There is moderate calcification of the  aortic valve. There is moderate thickening of the aortic valve. Aortic  valve regurgitation is trivial. Mild aortic valve stenosis.  Aortic valve  area, by VTI measures 1.81 cm.   Aortic valve mean gradient measures 11.7 mmHg. Aortic valve Vmax measures  2.50 m/s.  6. The inferior vena cava is normal in size with greater than 50%  respiratory variability, suggesting right atrial pressure of 3 mmHg.   )       Anesthesia Quick Evaluation

## 2021-08-29 NOTE — Progress Notes (Signed)
Patient called to clarify medications dosage for insulin tonight and tomorrow. Updated instructions to reflect basaglar and informed patient. All questions answered.

## 2021-08-29 NOTE — Progress Notes (Signed)
Surgical Instructions    Your procedure is scheduled on 08/30/21.  Report to [name] Main Entrance "A" at 8:15 A.M., then check in with the Admitting office.  Call this number if you have problems the morning of surgery:  [phone]   If you have any questions prior to your surgery date call [phone]: Open Monday-Friday 8am-4pm    Remember:  Do not eat or drink after midnight the night before your surgery      Take these medicines the morning of surgery with A SIP OF WATER  DULoxetine (CYMBALTA)  pantoprazole (PROTONIX)   IF NEEDED: acetaminophen (TYLENOL)  hydrOXYzine (VISTARIL)  insulin lispro (HUMALOG)   As of today, STOP taking any Aspirin (unless otherwise instructed by your surgeon) Aleve, Naproxen, Ibuprofen, Motrin, Advil, Goody's, BC's, all herbal medications, fish oil, and all vitamins.  Please hold Eliquis for 2-3 days prior to procedure. Your last dose will 08/26/21.  WHAT DO I DO ABOUT MY DIABETES MEDICATION?   Do not take oral diabetes medicines (pills) the morning of surgery.   The day of surgery, do not take other diabetes injectables, including Byetta (exenatide), Bydureon (exenatide ER), Victoza (liraglutide), or Trulicity (dulaglutide). Take 50% (17 units) of Insulin Glargine [facility]).  If your CBG is greater than 220 mg/dL, you may take  of your sliding scale (insulin lispro (HUMALOG) dose of insulin.    HOW TO MANAGE YOUR DIABETES BEFORE AND AFTER SURGERY  Why is it important to control my blood sugar before and after surgery? Improving blood sugar levels before and after surgery helps healing and can limit problems. A way of improving blood sugar control is eating a healthy diet by:  Eating less sugar and carbohydrates  Increasing activity/exercise  Talking with your doctor about reaching your blood sugar goals High blood sugars (greater than 180 mg/dL) can raise your risk of infections and slow your recovery, so you will  need to focus on controlling your diabetes during the weeks before surgery. Make sure that the doctor who takes care of your diabetes knows about your planned surgery including the date and location.  How do I manage my blood sugar before surgery? Check your blood sugar at least 4 times a day, starting 2 days before surgery, to make sure that the level is not too high or low.  Check your blood sugar the morning of your surgery when you wake up and every 2 hours until you get to the Short Stay unit.  If your blood sugar is less than 70 mg/dL, you will need to treat for low blood sugar: Do not take insulin. Treat a low blood sugar (less than 70 mg/dL) with  cup of clear juice (cranberry or apple), 4 glucose tablets, OR glucose gel. Recheck blood sugar in 15 minutes after treatment (to make sure it is greater than 70 mg/dL). If your blood sugar is not greater than 70 mg/dL on recheck, call [phone] for further instructions. Report your blood sugar to the short stay nurse when you get to Short Stay.  If you are admitted to the hospital after surgery: Your blood sugar will be checked by the staff and you will probably be given insulin after surgery (instead of oral diabetes medicines) to make sure you have good blood sugar levels. The goal for blood sugar control after surgery is 80-180 mg/dL.  After your COVID test   You are not required to quarantine however you are required to wear a well-fitting mask when you are out  and around people not in your household.  If your mask becomes wet or soiled, replace with a new one.  Wash your hands often with soap and water for 20 seconds or clean your hands with an alcohol-based hand sanitizer that contains at least 60% alcohol.  Do not share personal items.  Notify your provider: if you are in close contact with someone who has COVID  or if you develop a fever of 100.4 or greater, sneezing, cough, sore throat, shortness of breath or body  aches.             Do not wear jewelry or makeup Do not wear lotions, powders, perfumes/colognes, or deodorant. Men may shave face and neck. Do not bring valuables to the hospital. DO Not wear nail polish, gel polish, artificial nails, or any other type of covering on natural nails including finger and toenails. If patients have artificial nails, gel coating, etc. that need to be removed by a nail salon, please have this removed prior to surgery or surgery may need to be canceled/delayed if the surgeon/ anesthesia feels like the patient is unable to be adequately monitored.             [facility] [place] [facility] is not responsible for any belongings or valuables.  Do NOT Smoke (Tobacco/Vaping)  24 hours prior to your procedure  If you use a CPAP at night, you may bring your mask for your overnight stay.   Contacts, glasses, hearing aids, dentures or partials may not be worn into surgery, please bring cases for these belongings   For patients admitted to the hospital, discharge time will be determined by your treatment team.   Patients discharged the day of surgery will not be allowed to drive home, and someone needs to stay with them for 24 hours.  NO VISITORS WILL BE ALLOWED IN PRE-OP WHERE PATIENTS ARE PREPPED FOR SURGERY.  ONLY 1 SUPPORT PERSON MAY BE PRESENT IN THE WAITING ROOM WHILE YOU ARE IN SURGERY.  IF YOU ARE TO BE ADMITTED, ONCE YOU ARE IN YOUR ROOM YOU WILL BE ALLOWED TWO (2) VISITORS. 1 (ONE) VISITOR MAY STAY OVERNIGHT BUT MUST ARRIVE TO THE ROOM BY 8pm.  Minor children may have two parents present. Special consideration for safety and communication needs will be reviewed on a case by case basis.  Special instructions:    Oral Hygiene is also important to reduce your risk of infection.  Remember - BRUSH YOUR TEETH THE MORNING OF SURGERY WITH YOUR REGULAR TOOTHPASTE   [facility]- Preparing For Surgery  Before surgery, you can play an important role. Because skin is not sterile, your  skin needs to be as free of germs as possible. You can reduce the number of germs on your skin by washing with CHG (chlorahexidine gluconate) Soap before surgery.  CHG is an antiseptic cleaner which kills germs and bonds with the skin to continue killing germs even after washing.     Please do not use if you have an allergy to CHG or antibacterial soaps. If your skin becomes reddened/irritated stop using the CHG.  Do not shave (including legs and underarms) for at least 48 hours prior to first CHG shower. It is OK to shave your face.  Please follow these instructions carefully.     Shower the NIGHT BEFORE SURGERY and the MORNING OF SURGERY with CHG Soap.   If you chose to wash your hair, wash your hair first as usual with your normal shampoo. After you shampoo,  rinse your hair and body thoroughly to remove the shampoo.  Then [employer] and genitals (private parts) with your normal soap and rinse thoroughly to remove soap.  After that Use CHG Soap as you would any other liquid soap. You can apply CHG directly to the skin and wash gently with a scrungie or a clean washcloth.   Apply the CHG Soap to your body ONLY FROM THE NECK DOWN.  Do not use on open wounds or open sores. Avoid contact with your eyes, ears, mouth and genitals (private parts). Wash Face and genitals (private parts)  with your normal soap.   Wash thoroughly, paying special attention to the area where your surgery will be performed.  Thoroughly rinse your body with warm water from the neck down.  DO NOT shower/wash with your normal soap after using and rinsing off the CHG Soap.  Pat yourself dry with a CLEAN TOWEL.  Wear CLEAN PAJAMAS to bed the night before surgery  Place CLEAN SHEETS on your bed the night before your surgery  DO NOT SLEEP WITH PETS.   Day of Surgery: Take a shower with CHG soap. Wear Clean/Comfortable clothing the morning of surgery Do not apply any deodorants/lotions.   Remember to brush your teeth  WITH YOUR REGULAR TOOTHPASTE.   Please read over the following fact sheets that you were given.

## 2021-08-30 ENCOUNTER — Ambulatory Visit (HOSPITAL_COMMUNITY): Payer: Medicare Other | Admitting: Physician Assistant

## 2021-08-30 ENCOUNTER — Ambulatory Visit (HOSPITAL_COMMUNITY)
Admission: RE | Admit: 2021-08-30 | Discharge: 2021-08-30 | Disposition: A | Discharge status: Home / Self Care | Payer: Medicare Other | Attending: Otolaryngology | Admitting: Otolaryngology

## 2021-08-30 ENCOUNTER — Encounter (HOSPITAL_COMMUNITY): Payer: Self-pay | Admitting: Otolaryngology

## 2021-08-30 ENCOUNTER — Ambulatory Visit (HOSPITAL_COMMUNITY): Payer: Medicare Other | Admitting: Anesthesiology

## 2021-08-30 ENCOUNTER — Encounter (HOSPITAL_COMMUNITY)
Admission: RE | Disposition: A | Discharge status: Home / Self Care | Payer: Self-pay | Source: Home / Self Care | Attending: Otolaryngology

## 2021-08-30 DIAGNOSIS — I252 Old myocardial infarction: Secondary | ICD-10-CM | POA: Diagnosis not present

## 2021-08-30 DIAGNOSIS — I130 Hypertensive heart and chronic kidney disease with heart failure and stage 1 through stage 4 chronic kidney disease, or unspecified chronic kidney disease: Secondary | ICD-10-CM | POA: Insufficient documentation

## 2021-08-30 DIAGNOSIS — K219 Gastro-esophageal reflux disease without esophagitis: Secondary | ICD-10-CM | POA: Diagnosis not present

## 2021-08-30 DIAGNOSIS — I5030 Unspecified diastolic (congestive) heart failure: Secondary | ICD-10-CM | POA: Insufficient documentation

## 2021-08-30 DIAGNOSIS — N183 Chronic kidney disease, stage 3 unspecified: Secondary | ICD-10-CM | POA: Insufficient documentation

## 2021-08-30 DIAGNOSIS — G4733 Obstructive sleep apnea (adult) (pediatric): Secondary | ICD-10-CM | POA: Insufficient documentation

## 2021-08-30 DIAGNOSIS — J343 Hypertrophy of nasal turbinates: Secondary | ICD-10-CM | POA: Insufficient documentation

## 2021-08-30 DIAGNOSIS — I480 Paroxysmal atrial fibrillation: Secondary | ICD-10-CM | POA: Diagnosis not present

## 2021-08-30 DIAGNOSIS — E785 Hyperlipidemia, unspecified: Secondary | ICD-10-CM | POA: Diagnosis not present

## 2021-08-30 DIAGNOSIS — I2510 Atherosclerotic heart disease of native coronary artery without angina pectoris: Secondary | ICD-10-CM | POA: Diagnosis not present

## 2021-08-30 DIAGNOSIS — J342 Deviated nasal septum: Secondary | ICD-10-CM | POA: Diagnosis not present

## 2021-08-30 DIAGNOSIS — Z951 Presence of aortocoronary bypass graft: Secondary | ICD-10-CM | POA: Diagnosis not present

## 2021-08-30 DIAGNOSIS — E1122 Type 2 diabetes mellitus with diabetic chronic kidney disease: Secondary | ICD-10-CM | POA: Insufficient documentation

## 2021-08-30 HISTORY — PX: NASAL SEPTOPLASTY W/ TURBINOPLASTY: SHX2070

## 2021-08-30 LAB — GLUCOSE, CAPILLARY
Glucose-Capillary: 236 mg/dL — ABNORMAL HIGH (ref 70–99)
Glucose-Capillary: 192 mg/dL — ABNORMAL HIGH (ref 70–99)

## 2021-08-30 SURGERY — SEPTOPLASTY, NOSE, WITH NASAL TURBINATE REDUCTION
Anesthesia: General | Site: Nose | Laterality: Bilateral

## 2021-08-30 MED ORDER — CHLORHEXIDINE GLUCONATE 0.12 % MT SOLN: 15.0000 mL | Freq: Once | OROMUCOSAL | Status: AC

## 2021-08-30 MED ORDER — CEFAZOLIN SODIUM-DEXTROSE 2-4 GM/100ML-% IV SOLN
INTRAVENOUS | Status: AC
  Filled 2021-08-30: qty 100

## 2021-08-30 MED ORDER — LIDOCAINE-EPINEPHRINE 1 %-1:100000 IJ SOLN
INTRAMUSCULAR | Status: DC | PRN
  Administered 2021-08-30: 6 mL

## 2021-08-30 MED ORDER — MUPIROCIN CALCIUM 2 % EX CREA
TOPICAL_CREAM | CUTANEOUS | Status: AC
  Filled 2021-08-30: qty 15

## 2021-08-30 MED ORDER — SUGAMMADEX SODIUM 200 MG/2ML IV SOLN
INTRAVENOUS | Status: DC | PRN
  Administered 2021-08-30 (×2): 200 mg via INTRAVENOUS

## 2021-08-30 MED ORDER — CHLORHEXIDINE GLUCONATE 0.12 % MT SOLN
OROMUCOSAL | Status: AC
  Administered 2021-08-30: 15 mL via OROMUCOSAL
  Filled 2021-08-30: qty 15

## 2021-08-30 MED ORDER — LACTATED RINGERS IV SOLN: INTRAVENOUS | Status: DC | PRN

## 2021-08-30 MED ORDER — CEPHALEXIN 500 MG PO CAPS
500.0000 mg | ORAL_CAPSULE | Freq: Three times a day (TID) | ORAL | 0 refills | Status: AC
Start: 2021-08-30 — End: 2021-09-06

## 2021-08-30 MED ORDER — FENTANYL CITRATE (PF) 250 MCG/5ML IJ SOLN
INTRAMUSCULAR | Status: AC
  Filled 2021-08-30: qty 5

## 2021-08-30 MED ORDER — LIDOCAINE 2% (20 MG/ML) 5 ML SYRINGE
INTRAMUSCULAR | Status: AC
  Filled 2021-08-30: qty 5

## 2021-08-30 MED ORDER — PROPOFOL 10 MG/ML IV BOLUS
INTRAVENOUS | Status: DC | PRN
  Administered 2021-08-30: 100 mg via INTRAVENOUS

## 2021-08-30 MED ORDER — OXYMETAZOLINE HCL 0.05 % NA SOLN
NASAL | Status: DC | PRN
  Administered 2021-08-30: 1 via TOPICAL

## 2021-08-30 MED ORDER — PHENYLEPHRINE 40 MCG/ML (10ML) SYRINGE FOR IV PUSH (FOR BLOOD PRESSURE SUPPORT)
PREFILLED_SYRINGE | INTRAVENOUS | Status: AC
  Filled 2021-08-30: qty 10

## 2021-08-30 MED ORDER — LACTATED RINGERS IV SOLN: INTRAVENOUS | Status: DC

## 2021-08-30 MED ORDER — ARTIFICIAL TEARS OPHTHALMIC OINT
TOPICAL_OINTMENT | OPHTHALMIC | Status: AC
  Filled 2021-08-30: qty 3.5

## 2021-08-30 MED ORDER — ORAL CARE MOUTH RINSE: 15.0000 mL | Freq: Once | OROMUCOSAL | Status: AC

## 2021-08-30 MED ORDER — ROCURONIUM BROMIDE 10 MG/ML (PF) SYRINGE
PREFILLED_SYRINGE | INTRAVENOUS | Status: AC
  Filled 2021-08-30: qty 10

## 2021-08-30 MED ORDER — MUPIROCIN 2 % EX OINT
TOPICAL_OINTMENT | CUTANEOUS | Status: AC
  Filled 2021-08-30: qty 22

## 2021-08-30 MED ORDER — OXYMETAZOLINE HCL 0.05 % NA SOLN
NASAL | Status: AC
  Filled 2021-08-30: qty 30

## 2021-08-30 MED ORDER — MUPIROCIN 2 % EX OINT
TOPICAL_OINTMENT | CUTANEOUS | Status: DC | PRN
  Administered 2021-08-30: 1 via NASAL

## 2021-08-30 MED ORDER — PROPOFOL 10 MG/ML IV BOLUS
INTRAVENOUS | Status: AC
  Filled 2021-08-30: qty 20

## 2021-08-30 MED ORDER — OXYMETAZOLINE HCL 0.05 % NA SOLN
NASAL | Status: DC | PRN
  Administered 2021-08-30: 2 via NASAL

## 2021-08-30 MED ORDER — PHENYLEPHRINE HCL (PRESSORS) 10 MG/ML IV SOLN
INTRAVENOUS | Status: DC | PRN
  Administered 2021-08-30: 160 ug via INTRAVENOUS
  Administered 2021-08-30: 80 ug via INTRAVENOUS
  Administered 2021-08-30: 160 ug via INTRAVENOUS
  Administered 2021-08-30: 120 ug via INTRAVENOUS

## 2021-08-30 MED ORDER — LIDOCAINE-EPINEPHRINE 1 %-1:100000 IJ SOLN
INTRAMUSCULAR | Status: AC
  Filled 2021-08-30: qty 1

## 2021-08-30 MED ORDER — FENTANYL CITRATE (PF) 100 MCG/2ML IJ SOLN
INTRAMUSCULAR | Status: DC | PRN
  Administered 2021-08-30: 50 ug via INTRAVENOUS
  Administered 2021-08-30: 100 ug via INTRAVENOUS
  Administered 2021-08-30: 50 ug via INTRAVENOUS

## 2021-08-30 MED ORDER — ONDANSETRON HCL 4 MG/2ML IJ SOLN
INTRAMUSCULAR | Status: DC | PRN
  Administered 2021-08-30: 4 mg via INTRAVENOUS

## 2021-08-30 MED ORDER — ROCURONIUM BROMIDE 10 MG/ML (PF) SYRINGE
PREFILLED_SYRINGE | INTRAVENOUS | Status: DC | PRN
  Administered 2021-08-30: 70 mg via INTRAVENOUS

## 2021-08-30 MED ORDER — ONDANSETRON HCL 4 MG/2ML IJ SOLN
INTRAMUSCULAR | Status: AC
  Filled 2021-08-30: qty 2

## 2021-08-30 MED ORDER — CEFAZOLIN SODIUM-DEXTROSE 2-3 GM-%(50ML) IV SOLR
INTRAVENOUS | Status: DC | PRN
  Administered 2021-08-30: 2 g via INTRAVENOUS

## 2021-08-30 MED ORDER — LIDOCAINE 2% (20 MG/ML) 5 ML SYRINGE
INTRAMUSCULAR | Status: DC | PRN
  Administered 2021-08-30: 60 mg via INTRAVENOUS

## 2021-08-30 MED ORDER — 0.9 % SODIUM CHLORIDE (POUR BTL) OPTIME
TOPICAL | Status: DC | PRN
  Administered 2021-08-30: 500 mL

## 2021-08-30 SURGICAL SUPPLY — 24 items
BAG COUNTER SPONGE SURGICOUNT (BAG) ×3 IMPLANT
BAG SPNG CNTER NS LX DISP (BAG) ×1
CANISTER SUCT 3000ML PPV (MISCELLANEOUS) ×3 IMPLANT
ELECT REM PT RETURN 9FT ADLT (ELECTROSURGICAL) ×2
ELECTRODE REM PT RTRN 9FT ADLT (ELECTROSURGICAL) IMPLANT
GAUZE SPONGE 2X2 8PLY STRL LF (GAUZE/BANDAGES/DRESSINGS) ×2 IMPLANT
GLOVE SURG ENC TEXT LTX SZ7 (GLOVE) ×6 IMPLANT
GOWN STRL REUS W/ TWL LRG LVL3 (GOWN DISPOSABLE) ×4 IMPLANT
GOWN STRL REUS W/TWL LRG LVL3 (GOWN DISPOSABLE) ×4
KIT BASIN OR (CUSTOM PROCEDURE TRAY) ×3 IMPLANT
KIT TURNOVER KIT B (KITS) ×3 IMPLANT
NDL HYPO 25GX1X1/2 BEV (NEEDLE) ×2 IMPLANT
NEEDLE HYPO 25GX1X1/2 BEV (NEEDLE) ×2 IMPLANT
NS IRRIG 1000ML POUR BTL (IV SOLUTION) ×3 IMPLANT
PAD ARMBOARD 7.5X6 YLW CONV (MISCELLANEOUS) ×3 IMPLANT
SPLINT NASAL DOYLE BI-VL (GAUZE/BANDAGES/DRESSINGS) ×3 IMPLANT
SPONGE GAUZE 2X2 STER 10/PKG (GAUZE/BANDAGES/DRESSINGS) ×1
SPONGE NEURO XRAY DETECT 1X3 (DISPOSABLE) ×3 IMPLANT
SUT ETHILON 3 0 PS 1 (SUTURE) ×3 IMPLANT
SUT PLAIN 4 0 ~~LOC~~ 1 (SUTURE) ×3 IMPLANT
TOWEL GREEN STERILE FF (TOWEL DISPOSABLE) ×3 IMPLANT
TRAY ENT MC OR (CUSTOM PROCEDURE TRAY) ×3 IMPLANT
TUBE SALEM SUMP 16 FR W/ARV (TUBING) ×3 IMPLANT
TUBING EXTENTION W/L.L. (IV SETS) ×3 IMPLANT

## 2021-08-30 NOTE — Op Note (Signed)
Operative Note: SEPTOPLASTY AND INFERIOR TURBINATE REDUCTION  Patient: Fernando Hess record number: [id number]  Date:08/30/2021  Pre-operative Indications: 1. Deviated nasal septum with nasal airway obstruction     2.  Bilateral inferior turbinate hypertrophy  Postoperative Indications: Same  Surgical Procedure: 1.  Nasal Septoplasty    2.  Bilateral Inferior Turbinate Reduction  Anesthesia: GET  Surgeon: Delsa Bern, M.D.  Complications: None  EBL: 50 cc  Findings: Severely deviated nasal septum with airway obstruction and bilateral inferior turbinate hypertrophy.   Brief History: The patient is a 75 y.o. male with a history of progressive nasal airway obstruction. The patient has been on medical therapy to reduce nasal mucosal edema including saline nasal spray and topical nasal steroids. Despite appropriate medical therapy the patient continues to have ongoing symptoms. Given the patient's history and findings, the above surgical procedures were recommended, risks and benefits were discussed in detail with the patient may understand and agree with our plan for surgery which is scheduled at [facility] in OR under general anesthesia as an outpatient.  Surgical Procedure: The patient is brought to the operating room on 08/30/2021 and placed in supine position on the operating table. General endotracheal anesthesia was established without difficulty. When the patient was adequately anesthetized, surgical timeout was performed and correct identification of the patient and the surgical procedure. The patient's nose was then injected with 6 cc of 1% lidocaine 1 100,000 dilution epinephrine which was injected in a submucosal fashion. The patient's nose was then packed with Afrin-soaked cottonoid pledgets were left in place for approximately 10 minutes lateral vasoconstriction and hemostasis.  With the patient prepped draped and prepared for surgery, nasal septoplasty was  begun.  A left anterior hemitransfixion incision was created and a mucoperichondrial flap was elevated from anterior to posterior on the left-hand side. The anterior cartilaginous septum was crossed at the midline and a mucoperichondrial flap was elevated on the patient's right.  Swivel knife was then used to resect the anterior and mid cartilaginous portion of the nasal septum.  Resected cartilage was morcellized and returned to the mucoperichondrial pocket at the occlusion of the surgical procedure.  Dissection was then carried out from anterior to posterior removing deviated bone and cartilage including a large septal spur the overlying mucosa was preserved.  With the septum brought to good midline position, the morselized cartilage was returned to the mucoperichondrial pocket and the soft tissue/mucosal flaps were reapproximated with interrupted 4-0 gut suture on a Keith needle in a horizontal mattressing fashion.  Anterior hemitransfixion incision was closed with the same stitch.  Bilateral Doyle nasal septal splints were then placed after the application of Bactroban ointment and sutured in position with a 3-0 Ethilon suture.  Attention was then turned to the inferior turbinates, bilateral inferior turbinate intramural cautery was performed with cautery setting at 36 W.  2 submucosal passes were made in each inferior turbinate.  After completing cautery, anterior vertical incisions were created and overlying soft tissue was elevated, a small amount of turbinate bone was resected.  The turbinates were then outfractured to create a more patent nasal passageway.  Surgical sponge count was correct. An oral gastric tube was passed and the stomach contents were aspirated. Patient was awakened from anesthetic and transferred from the operating room to the recovery room in stable condition. There were no complications and blood loss was 50cc.   Delsa Bern, M.D. [facility] 08/30/2021

## 2021-08-30 NOTE — Anesthesia Postprocedure Evaluation (Signed)
Anesthesia Post Note  Patient: Fernando Hess  Procedure(s) Performed: NASAL SEPTOPLASTY WITH BILATERAL INFERIOR TURBINATE REDUCTION (Bilateral: Nose)     Patient location during evaluation: PACU Anesthesia Type: General Level of consciousness: awake and alert and oriented Pain management: pain level controlled Vital Signs Assessment: post-procedure vital signs reviewed and stable Respiratory status: spontaneous breathing, nonlabored ventilation and respiratory function stable Cardiovascular status: blood pressure returned to baseline and stable Postop Assessment: no apparent nausea or vomiting Anesthetic complications: no   No notable events documented.  Last Vitals:  Vitals:   08/30/21 1137 08/30/21 1152  BP: 128/80 126/67  Pulse: 74 85  Resp: 13 16  Temp:    SpO2: 96% 96%    Last Pain:  Vitals:   08/30/21 1137  TempSrc:   PainSc: Asleep                 Dane Kopke A.

## 2021-08-30 NOTE — H&P (Signed)
Fernando Hess is an 75 y.o. male.   Chief Complaint: Progressive nasal airway obstruction HPI: Patient with a history of deviated nasal septum and turbinate hypertrophy with worsening nasal obstruction.  Past Medical History:  Diagnosis Date   Acute renal failure (ARF) ([place]) 09/24/2015   Atrial premature complexes    CAD (coronary artery disease) of artery bypass graft    Early occlusion of saphenous vein graft to intermediate and marginal branch in February 2007 following bypass grafting    CAD (coronary artery disease), native coronary artery 2017   hx NSTEMI 09-24-2015  s/p  CABG x5 on 10-02-2015;  post op STEMI inferolateral wall,  SVG OM1 and SVG OM2 occluded, distal OM occlusion the calpruit, treated medically // Myoview 7/21: no ischemia, EF 65, low risk   CHF (congestive heart failure) ([name])    CKD (chronic kidney disease), stage III ([name])    Dyspnea    Elevated troponin    Erectile dysfunction    Esophageal reflux    History of atrial fibrillation    post op CABG 10-02-2015   History of kidney stones    History of non-ST elevation myocardial infarction (NSTEMI) 09/24/2015   s/p  CABG x5   History of ST elevation myocardial infarction (STEMI) 10/22/2015   inferior wall,  post op CABG 10-02-2015   Hyperlipidemia    Hypertension    Left ureteral stone    Mild atherosclerosis of both carotid arteries    Nephrolithiasis    per CT bilateral non-obstructive calculi   OSA (obstructive sleep apnea)    Peripheral artery disease    LE Arterial US 01/2019: R PTA and ATA occluded; L ATA occluded   RBBB (right bundle branch block)    Renal atrophy, right    Sleep apnea    wears cpap    ST elevation myocardial infarction (STEMI) of inferior wall ([place]) 10/22/2015   Type 2 diabetes mellitus treated with insulin ([place])    followed by pcp   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 ([place]) 03/01/2008   Wears glasses      Past Surgical History:  Procedure Laterality Date   [place] N/A 09/26/2015   Procedure: Left Heart Cath and Coronary Angiography;  Surgeon: Troy Sine, MD;  Location: [place] CV [facility];  Service: Cardiovascular;  Laterality: N/A;   CARDIAC CATHETERIZATION N/A 10/22/2015   Procedure: Left Heart Cath and Coronary Angiography;  Surgeon: Sherren Mocha, MD;  Location: [place] [facility];  Service: Cardiovascular;  Laterality: N/A;   CATARACT EXTRACTION W/ INTRAOCULAR LENS  IMPLANT, BILATERAL  2017   COLONOSCOPY     CORONARY ARTERY BYPASS GRAFT N/A 10/02/2015   Procedure: CORONARY ARTERY BYPASS GRAFTING (CABG) X5 [place]AD; SVG-DIAG; SVG-OM; SVG-PD; SVG-RAMUS TRANSESOPHAGEAL ECHOCARDIOGRAM (TEE) ENDOSCOPIC GREATER SAPHENOUS VEIN  HARVEST BILAT LE;  Surgeon: Ivin Poot, MD;  Location: [facility];  Service: Open Heart Surgery;  Laterality: N/A;   CYSTOSCOPY/URETEROSCOPY/HOLMIUM LASER/STENT PLACEMENT Left 08/10/2018   Procedure: CYSTOSCOPY/URETEROSCOPY/HOLMIUM LASER/STENT PLACEMENT;  Surgeon: Festus Aloe, MD;  Location: [facility];  Service: Urology;  Laterality: Left;   CYSTOSCOPY/URETEROSCOPY/HOLMIUM LASER/STENT PLACEMENT Left 09/10/2018   Procedure: CYSTOSCOPY/URETEROSCOPY/HOLMIUM LASER/STENT EXCHANGE;  Surgeon: Festus Aloe, MD;  Location: [facility];  Service: Urology;  Laterality: Left;   LEFT HEART CATHETERIZATION WITH CORONARY ANGIOGRAM N/A 04/13/2014   Procedure: LEFT HEART CATHETERIZATION WITH CORONARY ANGIOGRAM;  Surgeon: Jacolyn Reedy, MD;  Location: [facility] CATH  LAB;  Service: Cardiovascular;  Laterality: N/A;   LEG SURGERY Right age 15   closed reduction leg fracture   POLYPECTOMY     RIGHT HEART CATH N/A 06/06/2021   Procedure: RIGHT HEART CATH;  Surgeon: Jolaine Artist, MD;  Location: [place];  Service: Cardiovascular;  Laterality: N/A;   TEE WITHOUT CARDIOVERSION N/A 10/02/2015   Procedure: TRANSESOPHAGEAL  ECHOCARDIOGRAM (TEE);  Surgeon: Ivin Poot, MD;  Location: [facility];  Service: Open Heart Surgery;  Laterality: N/A;   URETEROSCOPY WITH HOLMIUM LASER LITHOTRIPSY Bilateral 2004;  2005  dr grapey  @[facility]    VASECTOMY      Family History  Problem Relation Age of Onset   Heart attack Mother    Heart attack Father    Diabetes Brother    Pancreatic cancer Brother    Diabetes Brother    Colon cancer Neg Hx    Esophageal cancer Neg Hx    Prostate cancer Neg Hx    Rectal cancer Neg Hx    Stomach cancer Neg Hx    Colon polyps Neg Hx    Social History:  reports that he has never smoked. He has never used smokeless tobacco. He reports that he does not currently use alcohol. He reports that he does not use drugs.  Allergies:  Allergies  Allergen Reactions   Cilostazol Swelling    edema   Trulicity [Dulaglutide] Nausea And Vomiting   Levaquin [Levofloxacin] Itching and Rash   Lisinopril Itching and Rash   Victoza [Liraglutide] Other (See Comments)    Severe fatigue & insomnia    Facility-Administered Medications Prior to Admission  Medication Dose Route Frequency Provider Last Rate Last Admin   sodium chloride flush (NS) 0.9 % injection 3 mL  3 mL Intravenous Q12H Chandrasekhar, Mahesh A, MD       Medications Prior to Admission  Medication Sig Dispense Refill   acetaminophen (TYLENOL) 325 MG tablet Take 325 mg by mouth every 6 (six) hours as needed for headache (pain).     atorvastatin (LIPITOR) 40 MG tablet Take 40 mg by mouth every evening.     DULoxetine (CYMBALTA) 30 MG capsule Take 30 mg by mouth every morning. Take with a 60 mg capsule for a total morning dose of 90 mg     DULoxetine (CYMBALTA) 60 MG capsule Take 60 mg by mouth every morning. Take with a 30 mg capsule for a total morning dose of 90 mg  4   ELIQUIS 5 MG TABS tablet TAKE 1 TABLET BY MOUTH TWICE A DAY 60 tablet 5   furosemide (LASIX) 20 MG tablet Take 20 mg by mouth daily.     hydrOXYzine (VISTARIL) 25 MG capsule  Take 25 mg by mouth at bedtime as needed for itching (sleep).     Insulin Glargine (BASAGLAR KWIKPEN) 100 UNIT/ML SOPN Inject 34 Units into the skin daily.     insulin lispro (HUMALOG) 100 UNIT/ML KwikPen Inject 12 Units into the skin 3 (three) times daily.     MAGNESIUM PO Take 350 mg by mouth every evening.     Melatonin 3 MG TABS Take 3 mg by mouth at bedtime.     Multiple Vitamin (MULTIVITAMIN WITH MINERALS) TABS tablet Take 1 tablet by mouth every morning.     nitroGLYCERIN (NITROSTAT) 0.4 MG SL tablet Place 0.4 mg under the tongue every 5 (five) minutes as needed for chest pain.     pantoprazole (PROTONIX) 40 MG tablet TAKE 1 TABLET BY  MOUTH EVERY DAY 90 tablet 2   zaleplon ([name]) 5 MG capsule 1 at bedtime as needed for sleep (Patient taking differently: Take 5 mg by mouth at bedtime.) 30 capsule 3   B-D UF III MINI PEN NEEDLES 31G X 5 MM MISC USE AS DIRECTED WITH LANTUS SOLOSTAR     ondansetron (ZOFRAN-ODT) 4 MG disintegrating tablet Take 4 mg by mouth every 8 (eight) hours as needed for nausea or vomiting.     ONETOUCH ULTRA test strip FOR USE OF CHEACKING BLOOD ONCE DAILY      Results for orders placed or performed during the hospital encounter of 08/30/21 (from the past 48 hour(s))  Glucose, capillary     Status: Abnormal   Collection Time: 08/30/21  8:08 AM  Result Value Ref Range   Glucose-Capillary 236 (H) 70 - 99 mg/dL    Comment: Glucose reference range applies only to samples taken after fasting for at least 8 hours.   No results found.  Review of Systems  HENT:  Positive for congestion.    Blood pressure (!) 148/66, pulse 65, temperature 98.5 F (36.9 C), temperature source Oral, resp. rate 17, height 5\' 6"  (1.676 m), weight 85.7 kg, SpO2 96 %. Physical Exam Constitutional:      Appearance: He is normal weight.  HENT:     Nose:     Comments: Nasal septal deviation with nasal obstruction Cardiovascular:     Rate and Rhythm: Normal rate.  Pulmonary:     Effort:  Pulmonary effort is normal.  Musculoskeletal:     Cervical back: Normal range of motion.  Neurological:     Mental Status: He is alert.     Assessment/Plan Patient admitted for outpatient surgery under general anesthesia: Nasal septoplasty and inferior turbinate reduction  Jerrell Belfast, MD 08/30/2021, 9:38 AM

## 2021-08-30 NOTE — Transfer of Care (Signed)
Immediate Anesthesia Transfer of Care Note  Patient: Fernando Hess  Procedure(s) Performed: NASAL SEPTOPLASTY WITH BILATERAL INFERIOR TURBINATE REDUCTION (Bilateral: Nose)  Patient Location: PACU  Anesthesia Type:General  Level of Consciousness: drowsy, patient cooperative and responds to stimulation  Airway & Oxygen Therapy: Patient Spontanous Breathing and Patient connected to face mask oxygen  Post-op Assessment: Report given to RN and Post -op Vital signs reviewed and stable  Post vital signs: Reviewed and stable  Last Vitals:  Vitals Value Taken Time  BP    Temp    Pulse    Resp    SpO2      Last Pain:  Vitals:   08/30/21 0831  TempSrc:   PainSc: 0-No pain         Complications: No notable events documented.

## 2021-08-30 NOTE — Anesthesia Procedure Notes (Signed)
Procedure Name: Intubation Date/Time: 08/30/2021 10:19 AM Performed by: Cathren Harsh, CRNA Pre-anesthesia Checklist: Patient identified, Emergency Drugs available, Suction available and Patient being monitored Patient Re-evaluated:Patient Re-evaluated prior to induction Oxygen Delivery Method: Circle System Utilized Preoxygenation: Pre-oxygenation with 100% oxygen Induction Type: IV induction Ventilation: Mask ventilation without difficulty Laryngoscope Size: Mac and 4 Grade View: Grade II Tube type: Oral Tube size: 7.5 mm Number of attempts: 1 Airway Equipment and Method: Stylet and Oral airway Placement Confirmation: ETT inserted through vocal cords under direct vision, positive ETCO2 and breath sounds checked- equal and bilateral Secured at: 23 cm Tube secured with: Tape Dental Injury: Teeth and Oropharynx as per pre-operative assessment

## 2021-08-31 ENCOUNTER — Encounter (HOSPITAL_COMMUNITY): Payer: Self-pay | Admitting: Otolaryngology

## 2021-09-18 ENCOUNTER — Other Ambulatory Visit: Payer: Self-pay | Admitting: Physician Assistant

## 2021-09-18 NOTE — Telephone Encounter (Signed)
Prescription refill request for Eliquis received. Indication: Afib  Last office visit:08/06/21 Fernando Hess)  Scr: 1.91 (08/27/21)  Age: 76 Weight: 85.7kg

## 2021-09-25 ENCOUNTER — Encounter (INDEPENDENT_AMBULATORY_CARE_PROVIDER_SITE_OTHER): Payer: Self-pay | Admitting: Ophthalmology

## 2021-09-25 ENCOUNTER — Ambulatory Visit (INDEPENDENT_AMBULATORY_CARE_PROVIDER_SITE_OTHER): Payer: Medicare Other | Admitting: Ophthalmology

## 2021-09-25 ENCOUNTER — Other Ambulatory Visit: Payer: Self-pay

## 2021-09-25 DIAGNOSIS — E113311 Type 2 diabetes mellitus with moderate nonproliferative diabetic retinopathy with macular edema, right eye: Secondary | ICD-10-CM | POA: Diagnosis not present

## 2021-09-25 DIAGNOSIS — Z9989 Dependence on other enabling machines and devices: Secondary | ICD-10-CM | POA: Diagnosis not present

## 2021-09-25 DIAGNOSIS — G4733 Obstructive sleep apnea (adult) (pediatric): Secondary | ICD-10-CM | POA: Diagnosis not present

## 2021-09-25 NOTE — Assessment & Plan Note (Addendum)
Patient underwent nasal septoplasty for deviated septum September 02, 2021, recovering nicely has been off of his CPAP however for period of time but has now resumed

## 2021-09-25 NOTE — Progress Notes (Signed)
09/25/2021     CHIEF COMPLAINT Patient presents for  Chief Complaint  Patient presents with   Retina Follow Up      HISTORY OF PRESENT ILLNESS: Fernando Hess is a 76 y.o. male who presents to the clinic today for:   HPI     Retina Follow Up           Diagnosis: Diabetic Retinopathy   Laterality: both eyes   Onset: 3 months ago   Severity: mild   Duration: 3 months   Course: stable         Comments   3 mos fu OU oct.  Patient underwent nasal septoplasty for deviated septum September 02, 2021, recovering nicely has been off of his CPAP however Patient states vision is stable and unchanged since last visit. Denies any new floaters or FOL.       Last edited by Hurman Horn, MD on 09/25/2021  8:53 AM.      Referring physician: [name], MD [place],  [place] [id number]  HISTORICAL INFORMATION:   Selected notes from the MEDICAL RECORD NUMBER    Lab Results  Component Value Date   HGBA1C 11.0 (H) 08/27/2021     CURRENT MEDICATIONS: No current outpatient medications on file. (Ophthalmic Drugs)   No current facility-administered medications for this visit. (Ophthalmic Drugs)   Current Outpatient Medications (Other)  Medication Sig   ELIQUIS 5 MG TABS tablet TAKE ONE TABLET BY MOUTH EVERY MORNING and TAKE ONE TABLET BY MOUTH EVERY EVENING   acetaminophen (TYLENOL) 325 MG tablet Take 325 mg by mouth every 6 (six) hours as needed for headache (pain).   atorvastatin (LIPITOR) 40 MG tablet Take 40 mg by mouth every evening.   B-D UF III MINI PEN NEEDLES 31G X 5 MM MISC USE AS DIRECTED WITH LANTUS SOLOSTAR   DULoxetine (CYMBALTA) 30 MG capsule Take 30 mg by mouth every morning. Take with a 60 mg capsule for a total morning dose of 90 mg   DULoxetine (CYMBALTA) 60 MG capsule Take 60 mg by mouth every morning. Take with a 30 mg capsule for a total morning dose of 90 mg   furosemide (LASIX) 20 MG tablet Take 20 mg by mouth daily.    hydrOXYzine (VISTARIL) 25 MG capsule Take 25 mg by mouth at bedtime as needed for itching (sleep).   Insulin Glargine (BASAGLAR KWIKPEN) 100 UNIT/ML SOPN Inject 34 Units into the skin daily.   insulin lispro (HUMALOG) 100 UNIT/ML KwikPen Inject 12 Units into the skin 3 (three) times daily.   MAGNESIUM PO Take 350 mg by mouth every evening.   Melatonin 3 MG TABS Take 3 mg by mouth at bedtime.   Multiple Vitamin (MULTIVITAMIN WITH MINERALS) TABS tablet Take 1 tablet by mouth every morning.   nitroGLYCERIN (NITROSTAT) 0.4 MG SL tablet Place 0.4 mg under the tongue every 5 (five) minutes as needed for chest pain.   ondansetron (ZOFRAN-ODT) 4 MG disintegrating tablet Take 4 mg by mouth every 8 (eight) hours as needed for nausea or vomiting.   ONETOUCH ULTRA test strip FOR USE OF CHEACKING BLOOD ONCE DAILY   pantoprazole (PROTONIX) 40 MG tablet TAKE 1 TABLET BY MOUTH EVERY DAY   zaleplon ([name]) 5 MG capsule 1 at bedtime as needed for sleep (Patient taking differently: Take 5 mg by mouth at bedtime.)   Current Facility-Administered Medications (Other)  Medication Route   sodium chloride flush (NS) 0.9 % injection 3  mL Intravenous      REVIEW OF SYSTEMS:    ALLERGIES Allergies  Allergen Reactions   Cilostazol Swelling    edema   Trulicity [Dulaglutide] Nausea And Vomiting   Levaquin [Levofloxacin] Itching and Rash   Lisinopril Itching and Rash   Victoza [Liraglutide] Other (See Comments)    Severe fatigue & insomnia    PAST MEDICAL HISTORY Past Medical History:  Diagnosis Date   Acute renal failure (ARF) ([place]) 09/24/2015   Atrial premature complexes    CAD (coronary artery disease) of artery bypass graft    Early occlusion of saphenous vein graft to intermediate and marginal branch in February 2007 following bypass grafting    CAD (coronary artery disease), native coronary artery 2017   hx NSTEMI 09-24-2015  s/p  CABG x5 on 10-02-2015;  post op STEMI inferolateral wall,  SVG OM1  and SVG OM2 occluded, distal OM occlusion the calpruit, treated medically // Myoview 7/21: no ischemia, EF 65, low risk   CHF (congestive heart failure) ([name])    CKD (chronic kidney disease), stage III ([name])    Dyspnea    Elevated troponin    Erectile dysfunction    Esophageal reflux    History of atrial fibrillation    post op CABG 10-02-2015   History of kidney stones    History of non-ST elevation myocardial infarction (NSTEMI) 09/24/2015   s/p  CABG x5   History of ST elevation myocardial infarction (STEMI) 10/22/2015   inferior wall,  post op CABG 10-02-2015   Hyperlipidemia    Hypertension    Left ureteral stone    Mild atherosclerosis of both carotid arteries    Nephrolithiasis    per CT bilateral non-obstructive calculi   OSA (obstructive sleep apnea)    Peripheral artery disease    LE Arterial US 01/2019: R PTA and ATA occluded; L ATA occluded   RBBB (right bundle branch block)    Renal atrophy, right    Sleep apnea    wears cpap    ST elevation myocardial infarction (STEMI) of inferior wall ([place]) 10/22/2015   Type 2 diabetes mellitus treated with insulin ([place])    followed by pcp   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 ([place]) 03/01/2008   Wears glasses    Past Surgical History:  Procedure Laterality Date   [place] N/A 09/26/2015   Procedure: Left Heart Cath and Coronary Angiography;  Surgeon: Troy Sine, MD;  Location: [facility];  Service: Cardiovascular;  Laterality: N/A;   CARDIAC CATHETERIZATION N/A 10/22/2015   Procedure: Left Heart Cath and Coronary Angiography;  Surgeon: Sherren Mocha, MD;  Location: [facility];  Service: Cardiovascular;  Laterality: N/A;   CATARACT EXTRACTION W/ INTRAOCULAR LENS  IMPLANT, BILATERAL  2017   COLONOSCOPY     CORONARY ARTERY BYPASS GRAFT N/A 10/02/2015   Procedure: CORONARY ARTERY BYPASS GRAFTING (CABG)  X5 LIMA-LAD; SVG-DIAG; SVG-OM; SVG-PD; SVG-RAMUS TRANSESOPHAGEAL ECHOCARDIOGRAM (TEE) ENDOSCOPIC GREATER SAPHENOUS VEIN  HARVEST BILAT LE;  Surgeon: Ivin Poot, MD;  Location: [facility];  Service: Open Heart Surgery;  Laterality: N/A;   CYSTOSCOPY/URETEROSCOPY/HOLMIUM LASER/STENT PLACEMENT Left 08/10/2018   Procedure: CYSTOSCOPY/URETEROSCOPY/HOLMIUM LASER/STENT PLACEMENT;  Surgeon: Festus Aloe, MD;  Location: [facility];  Service: Urology;  Laterality: Left;   CYSTOSCOPY/URETEROSCOPY/HOLMIUM LASER/STENT PLACEMENT Left 09/10/2018   Procedure: CYSTOSCOPY/URETEROSCOPY/HOLMIUM LASER/STENT EXCHANGE;  Surgeon: Festus Aloe, MD;  Location: [facility];  Service:  Urology;  Laterality: Left;   LEFT HEART CATHETERIZATION WITH CORONARY ANGIOGRAM N/A 04/13/2014   Procedure: LEFT HEART CATHETERIZATION WITH CORONARY ANGIOGRAM;  Surgeon: Jacolyn Reedy, MD;  Location: [facility];  Service: Cardiovascular;  Laterality: N/A;   LEG SURGERY Right age 54   closed reduction leg fracture   NASAL SEPTOPLASTY W/ TURBINOPLASTY Bilateral 08/30/2021   Procedure: NASAL SEPTOPLASTY WITH BILATERAL INFERIOR TURBINATE REDUCTION;  Surgeon: Jerrell Belfast, MD;  Location: [facility];  Service: ENT;  Laterality: Bilateral;   POLYPECTOMY     RIGHT HEART CATH N/A 06/06/2021   Procedure: RIGHT HEART CATH;  Surgeon: Jolaine Artist, MD;  Location: [facility];  Service: Cardiovascular;  Laterality: N/A;   TEE WITHOUT CARDIOVERSION N/A 10/02/2015   Procedure: TRANSESOPHAGEAL ECHOCARDIOGRAM (TEE);  Surgeon: Ivin Poot, MD;  Location: [facility];  Service: Open Heart Surgery;  Laterality: N/A;   URETEROSCOPY WITH HOLMIUM LASER LITHOTRIPSY Bilateral 2004;  2005  dr grapey  @[facility]    VASECTOMY      FAMILY HISTORY Family History  Problem Relation Age of Onset   Heart attack Mother    Heart attack Father    Diabetes Brother    Pancreatic cancer Brother    Diabetes Brother    Colon cancer Neg Hx     Esophageal cancer Neg Hx    Prostate cancer Neg Hx    Rectal cancer Neg Hx    Stomach cancer Neg Hx    Colon polyps Neg Hx     SOCIAL HISTORY Social History   Tobacco Use   Smoking status: Never   Smokeless tobacco: Never  Vaping Use   Vaping Use: Never used  Substance Use Topics   Alcohol use: Not Currently   Drug use: Never         OPHTHALMIC EXAM:  Base Eye Exam     Visual Acuity (ETDRS)       Right Left   Dist cc 20/30 -1 20/30         Tonometry (Tonopen, 8:14 AM)       Right Left   Pressure 20 21         Pupils       Pupils Dark Light APD   Right PERRL 5 5 None   Left PERRL 5 5 None         Extraocular Movement       Right Left    Full Full         Neuro/Psych     Oriented x3: Yes   Mood/Affect: Normal         Dilation     Both eyes: 1.0% Mydriacyl, 2.5% Phenylephrine @ 8:14 AM           Slit Lamp and Fundus Exam     External Exam       Right Left   External Normal Normal         Slit Lamp Exam       Right Left   Lids/Lashes Normal Normal   Conjunctiva/Sclera White and quiet White and quiet   Cornea Clear Clear   Anterior Chamber Deep and quiet Deep and quiet   Iris Round and reactive Round and reactive   Lens Posterior chamber intraocular lens Posterior chamber intraocular lens   Anterior Vitreous Normal Normal         Fundus Exam       Right Left   Posterior Vitreous Posterior vitreous detachment Normal   Disc Normal  Normal   C/D Ratio 0.5 0.55   Macula Macular thickening superotemporal to fovea., Microaneurysms, Exudates no clinically significant macular edema, no macular thickening, Microaneurysms   Vessels NPDR- Moderate NPDR- Moderate   Periphery Choroidal nevus, small in macula,, stable, 2 dd size, flat Dot-blot hemorrhage            IMAGING AND PROCEDURES  Imaging and Procedures for 09/25/21  OCT, Retina - OU - Both Eyes       Right Eye Quality was good. Scan locations included  subfoveal. Central Foveal Thickness: 284. Progression has worsened. Findings include abnormal foveal contour, cystoid macular edema.   Left Eye Quality was good. Scan locations included subfoveal. Central Foveal Thickness: 258. Progression has been stable. Findings include abnormal foveal contour.   Notes CSME superotemporal to the fovea OD, minor OD today, will recommend focal laser treatment right eyes so as to avoid antivegF as a patient to recover some major head and neck surgery     Focal Laser - OD - Right Eye       Time Out Confirmed correct patient, procedure, site, and patient consented.   Anesthesia Topical anesthesia was used. Anesthetic medications included Proparacaine 0.5%.   Laser Information The type of laser was diode. Color was yellow. The duration in seconds was 0.1. The spot size was 100 microns. Laser power was 70. Total spots was 92.   Post-op The patient tolerated the procedure well. There were no complications. The patient received written and verbal post procedure care education.   Notes Focal grid laser  superotemporal to the fovea,, very light quiescent, spacing 0.7 with light to moderate insulin burns, no focal applications of 401 m spot size applied to center region of leakage             ASSESSMENT/PLAN:  OSA on CPAP Patient underwent nasal septoplasty for deviated septum September 02, 2021, recovering nicely has been off of his CPAP however for period of time but has now resumed     ICD-10-CM   1. 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 OSA on CPAP  G47.33    Z99.89       1.  OD, recurrent CSME superotemporal to FAZ, will treat with focal laser today to minimize treatment burden of antivegF and to avoid hindrance of healing for recent head neck surgery  2.  3.  Ophthalmic  Meds Ordered this visit:  No orders of the defined types were placed in this encounter.      Return in about 4 months (around 01/23/2022) for DILATE OU, COLOR FP, OCT.  There are no Patient Instructions on file for this visit.   Explained the diagnoses, plan, and follow up with the patient and they expressed understanding.  Patient expressed understanding of the importance of proper follow up care.   Clent Demark Brylinn Teaney M.D. Diseases & Surgery of the Retina and Vitreous [facility]ina [facility] [place] 09/25/21     Abbreviations: M myopia (nearsighted); A astigmatism; H hyperopia (farsighted); P presbyopia; Mrx spectacle prescription;  CTL contact lenses; OD right eye; OS left eye; OU both eyes  XT exotropia; ET esotropia; PEK punctate epithelial keratitis; PEE punctate epithelial erosions; DES dry eye syndrome; MGD meibomian gland dysfunction; ATs artificial tears; PFAT's preservative free artificial tears; [place] nuclear sclerotic cataract; [place]  posterior subcapsular cataract; ERM epi-retinal membrane; PVD posterior vitreous detachment; RD retinal detachment; DM diabetes mellitus; DR diabetic retinopathy; NPDR non-proliferative diabetic retinopathy; PDR proliferative diabetic retinopathy; CSME clinically significant macular edema; DME diabetic macular edema; dbh dot blot hemorrhages; CWS cotton wool spot; POAG primary open angle glaucoma; C/D cup-to-disc ratio; HVF humphrey visual field; GVF goldmann visual field; OCT optical coherence tomography; IOP intraocular pressure; BRVO Branch retinal vein occlusion; CRVO central retinal vein occlusion; CRAO central retinal artery occlusion; BRAO branch retinal artery occlusion; RT retinal tear; SB scleral buckle; PPV pars plana vitrectomy; VH Vitreous hemorrhage; PRP panretinal laser photocoagulation; IVK intravitreal kenalog; VMT vitreomacular traction; MH Macular hole;  NVD neovascularization of the disc; NVE neovascularization elsewhere; AREDS age related eye  disease study; ARMD age related macular degeneration; POAG primary open angle glaucoma; EBMD epithelial/anterior basement membrane dystrophy; ACIOL anterior chamber intraocular lens; IOL intraocular lens; PCIOL posterior chamber intraocular lens; Phaco/IOL phacoemulsification with intraocular lens placement; [place] photorefractive keratectomy; LASIK laser assisted in situ keratomileusis; HTN hypertension; DM diabetes mellitus; COPD chronic obstructive pulmonary disease

## 2021-09-27 ENCOUNTER — Other Ambulatory Visit: Payer: Self-pay | Admitting: Internal Medicine

## 2021-10-21 NOTE — Progress Notes (Signed)
06/18/21- 75 yoM never smoker for sleep evaluation courtesy of Dr Lamonte Sakai. Medical problem list includes CAD/ MI/ RBBB/ CABG, , PAD, HTN, dCHF, OSA, DM2/ retinopathy, CKD3,  Epworth score- Body weight today-     188 lbs                  Wife here                 Covid vax- Flu vax- NPSG ([facility]) 11/25/18- AHI 37.9/ hr, desaturation to 79%, body weight 201 lbs CPAP auto 5-15/ [facility] Last [facility] contact for OSA- phone report of CPAP compliance 08/28/20- -----Referred by PCP for history of OSA. States that he uses his CPAP "sometimes"- about 2 days/ week. Complains of "trouble breathing" and gets smothered sometimes at night so takes CPAP off. Scant dry cough. Not aware of lung disease. Easily DOE, hills and stairs. No acute change. Walk Test Room Air 06/18/21- 3 Steady laps- lowest O2 sat 95%, max HR 103.  10/22/21- 13 yoM never smoker followed for OSA, complicated by CAD/ MI/ RBBB/ CABG, PAD, HTN, dCHF, OSA, DM2/ retinopathy, CKD3,  -[name] 5, Melatonin, Cymbalta,  CPAP- "sometimes" / [facility]                                   Wife here Download- Body weight today-   192 lbs                               Covid vax-3 Phizer Flu vax-had ENT/ [facility]- Septoplasty/ turbinate reduction 12/16. ------Patient states that he is out of medication to help him sleep. States that he is having shortness of breath with exertion and sometimes even at rest. States that he is having to clear his throat more than normal sometimes after eating.  He asks for refill [name].  Discussed this as an aid to better CPAP compliance. He mentions that he remains short of breath with little cough or wheeze.  Not anemic.  PFT was previously scheduled but apparently he was reluctant because of claustrophobia.  He agrees to try now with Xanax given for that appointment.  ROS-see HPI   + = positive Constitutional:    weight loss, night sweats, fevers, chills, fatigue, lassitude. HEENT:    headaches, difficulty swallowing, tooth/dental  problems, sore throat,       sneezing, itching, ear ache, nasal congestion, post nasal drip, snoring CV:    chest pain, orthopnea, PND, swelling in lower extremities, anasarca,                                dizziness, palpitations Resp:   +shortness of breath with exertion or at rest.                productive cough,   non-productive cough, coughing up of blood.              change in color of mucus.  wheezing.   Skin:    rash or lesions. GI:  No-   heartburn, indigestion, abdominal pain, nausea, vomiting, diarrhea,                 change in bowel habits, loss of appetite GU: dysuria, change in color of urine, no urgency or frequency.   flank pain. MS:   joint pain,  stiffness, decreased range of motion, back pain. Neuro-     nothing unusual Psych:  change in mood or affect.  depression or anxiety.   memory loss.   OBJ- Physical Exam General- Alert, Oriented, Affect-appropriate, Distress- none acute Skin- rash-none, lesions- none, excoriation- none Lymphadenopathy- none Head- atraumatic            Eyes- Gross vision intact, PERRLA, conjunctivae and secretions clear            Ears- Hearing, canals-normal            Nose- Clear, no-Septal dev, mucus, polyps, erosion, perforation             Throat- Mallampati III , mucosa clear , drainage- none, tonsils- atrophic,  +teeth/ some repair Neck- flexible , trachea midline, no stridor , thyroid nl, carotid no bruit Chest - symmetrical excursion , unlabored           Heart/CV- RRR , no murmur , no gallop  , no rub, nl s1 s2                           - JVD- none , edema- none, stasis changes- none, varices- none           Lung- clear to P&A, wheeze- none, cough- none , dullness-none, rub- none           Chest wall-  Abd-  Br/ Gen/ Rectal- Not done, not indicated Extrem- cyanosis- none, clubbing, none, atrophy- none, strength- nl Neuro- grossly intact to observation

## 2021-10-21 NOTE — Addendum Note (Signed)
Addended by: Colon Branch on: 10/21/2021 08:47 AM   Modules accepted: Orders

## 2021-10-21 NOTE — Addendum Note (Signed)
Addended by: Elby Beck R on: 10/21/2021 09:53 AM   Modules accepted: Orders

## 2021-10-22 ENCOUNTER — Encounter: Payer: Self-pay | Admitting: Internal Medicine

## 2021-10-22 ENCOUNTER — Other Ambulatory Visit: Payer: Self-pay

## 2021-10-22 ENCOUNTER — Ambulatory Visit (INDEPENDENT_AMBULATORY_CARE_PROVIDER_SITE_OTHER): Payer: Medicare Other | Admitting: Internal Medicine

## 2021-10-22 VITALS — BP 110/60 | HR 42 | Temp 98.1°F | Ht 66.0 in | Wt 192.8 lb

## 2021-10-22 DIAGNOSIS — Z9989 Dependence on other enabling machines and devices: Secondary | ICD-10-CM | POA: Diagnosis not present

## 2021-10-22 DIAGNOSIS — G4733 Obstructive sleep apnea (adult) (pediatric): Secondary | ICD-10-CM | POA: Diagnosis not present

## 2021-10-22 DIAGNOSIS — R0602 Shortness of breath: Secondary | ICD-10-CM

## 2021-10-22 MED ORDER — ALPRAZOLAM 0.25 MG PO TABS: ORAL_TABLET | ORAL | 0 refills | Status: DC

## 2021-10-22 MED ORDER — ZALEPLON 5 MG PO CAPS: 5.0000 mg | ORAL_CAPSULE | Freq: Every day | ORAL | 5 refills | Status: DC

## 2021-10-22 NOTE — Patient Instructions (Addendum)
Order- DME [facility]- continue auto 5-15, mask of choice, humidifier, supplies,  Please install AirView/ card  Order schedule PFT  dx Dyspnea on exertion  Script sent for Xanax(alprazolam)   1 tab, to take 1 hor before your pulmonary function test  [name] refilled  You can speak to Dr Harrington Challenger about the coughing you do when you swallow. He may want you to have a swallowing study done.

## 2021-10-23 ENCOUNTER — Encounter: Payer: Self-pay | Admitting: Internal Medicine

## 2021-10-23 NOTE — Assessment & Plan Note (Signed)
Discussed dyspnea as potentially having several causes and explained rationale for ordering pulmonary function test. Plan-Xanax given for one-time use for PFT

## 2021-10-23 NOTE — Assessment & Plan Note (Signed)
Educated for better compliance Adult nurse from DME

## 2021-11-05 DIAGNOSIS — I350 Nonrheumatic aortic (valve) stenosis: Secondary | ICD-10-CM | POA: Insufficient documentation

## 2021-11-05 DIAGNOSIS — I480 Paroxysmal atrial fibrillation: Secondary | ICD-10-CM | POA: Insufficient documentation

## 2021-11-05 NOTE — Progress Notes (Addendum)
Cardiology Office Note:    Date:  11/06/2021   ID:  Fernando Hess, DOB 22-Jan-1946, MRN [id number]  PCP:  Fernando Cruel, MD  Fernando Hess Cardiologist:  Fernando Bergeron, MD Cardiology APP:  Fernando Shi, [name]  Electrophysiologist:  Fernando Epley, MD    Referring MD: Fernando Cruel, MD   Chief Complaint:  F/u for CAD and Shortness of Breath    Patient Profile: Coronary artery disease S/p MI in 09/2015 >> s/p CABG S/p inferolateral STEMI 2/17 >> S-OM1 and S-OM2 occluded; dOM occluded >> med Rx Myoview 10/2018: no isch, EF 52, apical thinning  Myoview 7/21: low risk  Myoview 12/22: low risk  Heart failure with preserved ejection fraction  admx in 11/2019 Paroxysmal atrial fibrillation CHA2DS2-VASc Score = 6 >> Apixaban   Tachy-brady syndrome (eval by Dr. Quentin Hess with EP in 11/21)   Mild aortic stenosis (echo 8/22: Mean 11.7) Diabetes mellitus Hypertension Hyperlipidemia Chronic kidney disease stage IV Obstructive sleep apnea Peripheral arterial disease eval by Dr. Gwenlyn Hess 7.2020; bilat tibial disease >> Med Rx (Pletal) >> DC'd with CHF  Prior CV Studies: Myoview 08/16/21 No ischemia or infarction; EF 55; Low Risk   Cardiac monitor 06/07/21 NSR, Average heart rate of 76 bpm; freq short runs of Supraventricular Tachycardia; isolated PACs (7.3%), PVCs   R cardiac catheterization 06/06/21 RA = 2, RV = 32/5, PA = 31/11 (19), PCW = 4 Fick cardiac output/index = 4.1/2.1 PVR = 3.6 WU Assessment: 1. Low filling pressures with moderately reduce CO   Echocardiogram 05/10/21 EF 60-65, mild LVH, GR 1 DD, mildly reduced RVSF, mild LAE, trivial MR, moderate AV calcification, mild AS (mean 11.7, V-max 250 cm/s)   Carotid US 06/21/20 Bilat ICA 1-39   Event monitor 04/2020 Freq episodes of AF w RVR; bradycardia >> Tachy-brady syndrome   Myoview 03/20/20 EF 65, apical thinning artifact, no ischemia, low risk    Echocardiogram 11/19/2019 EF 60-65, moderate  LVH, GR 1 DD, no RWMA, mild AI, mild MR, mildly dilated ascending aorta (39 mm)   LE arterial US 02/11/2019 Right PTA and ATA occluded; left ATA occluded   Myoview 10/21/2018 Low risk, mildly abnormal stress nuclear study with apical thinning but no ischemia; EF 52 with mild global hypokinesis    Holter 06/11/18 Very frequent supraventricular ectopy, contributing to 15% of overall beats. Very frequent couplets and a very short runs of atrial tachycardia.   Echocardiogram 10/23/2015 EF 55-60, normal wall motion, mildly calcified aortic valve leaflets, mild AI, mild to moderate MR   Cardiac catheterization 10/22/2015 LM ostial 50 LAD mid 90 RI ostial 90 LCx proximal 80; OM1 80; OM2 100 (culprit for MI) RCA mid 50 LIMA-LAD patent SVG-RPDA patent SVG-D1 patent SVG-OM1 100 SVG-OM2 100 Medical therapy   Pre-CABG Dopplers 09/28/2015 Bilat ICA 1-39    History of Present Illness:   Fernando Hess is a 76 y.o. male with the above problem list.  He was admitted 04/2021 with episodes of syncope and shortness of breath.  Echocardiogram demonstrated normal EF.  His BNP was mildly elevated and he was diuresed with IV furosemide.  PRN furosemide was recommended at DC.  His beta-blocker was DC'd.  He had orthostatic hypotension noted.  He saw Fernando Hess after DC and was set up for RHC due to ongoing shortness of breath and weakness.  RHC showed low filling pressures.  He was asked to reduce furosemide use (he had been taking it daily prior to [place]) to 3  times a week.  An OP event monitor showed frequent asymptomatic PACs and short runs of Supraventricular Tachycardia (no AF or pauses).  A f/u with EP was scheduled 11/3 with Dr. Quentin Hess given hx of tachy-brady syndrome, but the pt canceled this appt.  He was last seen by Dr. Johney Hess in 11/22.  He needs nasal surgery for a deviated septum.  A Myoview was arranged and was low risk.  He underwent nasal septoplasty with Dr. Wilburn Hess in 12/22.    He returns  for f/u.  He is here with his wife, who is also seen today.  He continues to have issues with shortness of breath.  He describes having the packing taken out of his nose after his septoplasty and having improved breathing for about 2 minutes.  He notes shortness of breath with activity.  He also describes having shortness of breath at rest.  He describes helping his wife move furniture a couple of weeks ago.  He was consistently short of breath for 2 days after this.  He has not had orthopnea.  He wears a CPAP at night.  He has not had chest pain, leg edema.  He has not had any further syncope since late 2022.    Past Medical History:  Diagnosis Date   Acute renal failure (ARF) ([place]) 09/24/2015   Atrial premature complexes    CAD (coronary artery disease) of artery bypass graft    Early occlusion of saphenous vein graft to intermediate and marginal branch in February 2007 following bypass grafting    CAD (coronary artery disease), native coronary artery 2017   hx NSTEMI 09-24-2015  s/p  CABG x5 on 10-02-2015;  post op STEMI inferolateral wall,  SVG OM1 and SVG OM2 occluded, distal OM occlusion the calpruit, treated medically // Myoview 7/21: no ischemia, EF 65, low risk   CHF (congestive heart failure) ([name])    CKD (chronic kidney disease), stage III ([name])    Dyspnea    Elevated troponin    Erectile dysfunction    Esophageal reflux    History of atrial fibrillation    post op CABG 10-02-2015   History of kidney stones    History of non-ST elevation myocardial infarction (NSTEMI) 09/24/2015   s/p  CABG x5   History of ST elevation myocardial infarction (STEMI) 10/22/2015   inferior wall,  post op CABG 10-02-2015   Hyperlipidemia    Hypertension    Left ureteral stone    Mild atherosclerosis of both carotid arteries    Nephrolithiasis    per CT bilateral non-obstructive calculi   OSA (obstructive sleep apnea)    Peripheral artery disease    LE Arterial US 01/2019: R PTA and ATA occluded;  L ATA occluded   RBBB (right bundle branch block)    Renal atrophy, right    Sleep apnea    wears cpap    ST elevation myocardial infarction (STEMI) of inferior wall ([place]) 10/22/2015   Type 2 diabetes mellitus treated with insulin ([place])    followed by pcp   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   ALPRAZolam (XANAX) 0.25 MG tablet Take for anxiety 1 hour before PFT   atorvastatin (LIPITOR) 40 MG tablet Take 40 mg by  mouth every evening.   B-D UF III MINI PEN NEEDLES 31G X 5 MM MISC USE AS DIRECTED WITH LANTUS SOLOSTAR   DULoxetine (CYMBALTA) 30 MG capsule Take 30 mg by mouth every morning. Take with a 60 mg capsule for a total morning dose of 90 mg   DULoxetine (CYMBALTA) 60 MG capsule Take 60 mg by mouth every morning. Take with a 30 mg capsule for a total morning dose of 90 mg   ELIQUIS 5 MG TABS tablet TAKE ONE TABLET BY MOUTH EVERY MORNING and TAKE ONE TABLET BY MOUTH EVERY EVENING   furosemide (LASIX) 20 MG tablet Take 20 mg by mouth daily.   hydrOXYzine (VISTARIL) 25 MG capsule Take 25 mg by mouth at bedtime as needed for itching (sleep).   Insulin Glargine (BASAGLAR KWIKPEN) 100 UNIT/ML SOPN Inject 40 Units into the skin daily.   insulin lispro (HUMALOG) 100 UNIT/ML KwikPen Inject 14 Units into the skin 3 (three) times daily.   MAGNESIUM PO Take 350 mg by mouth every evening.   Melatonin 3 MG TABS Take 3 mg by mouth at bedtime.   Multiple Vitamin (MULTIVITAMIN WITH MINERALS) TABS tablet Take 1 tablet by mouth every morning.   nitroGLYCERIN (NITROSTAT) 0.4 MG SL tablet Place 0.4 mg under the tongue every 5 (five) minutes as needed for chest pain.   ondansetron (ZOFRAN-ODT) 4 MG disintegrating tablet Take 4 mg by  mouth every 8 (eight) hours as needed for nausea or vomiting.   pantoprazole (PROTONIX) 40 MG tablet TAKE 1 TABLET BY MOUTH EVERY DAY   zaleplon ([name]) 5 MG capsule Take 1 capsule (5 mg total) by mouth at bedtime.   Current Facility-Administered Medications for the 11/06/21 encounter (Office Visit) with [name], [name]  Medication   sodium chloride flush (NS) 0.9 % injection 3 mL    Allergies:   Cilostazol, Dulaglutide, Levofloxacin, Liraglutide, and Lisinopril   Social History   Tobacco Use   Smoking status: Never   Smokeless tobacco: Never  Vaping Use   Vaping Use: Never used  Substance Use Topics   Alcohol use: Not Currently   Drug use: Never    Family Hx: The patient's family history includes Diabetes in his brother and brother; Heart attack in his father and mother; Pancreatic cancer in his brother. There is no history of Colon cancer, Esophageal cancer, Prostate cancer, Rectal cancer, Stomach cancer, or Colon polyps.  Review of Systems  Musculoskeletal:  Positive for joint pain (L shoulder; R arm).  Gastrointestinal:  Positive for dysphagia.  Neurological:  Negative for aphonia and focal weakness.    EKGs/Labs/Other Test Reviewed:    EKG:  EKG is  ordered today.  The ekg ordered today demonstrates NSR/sinus arrhythmia, PACs, RBBB, QTc 484, no change from prior tracing  Recent Labs: 05/10/2021: ALT 21; B Natriuretic Peptide 213.1 05/31/2021: Magnesium 2.0 06/26/2021: NT-Pro BNP 627 08/27/2021: BUN 23; Creatinine, Ser 1.91; Hemoglobin 14.5; Platelets 247; Potassium 3.8; Sodium 138   Labs obtained through [facility] - personally reviewed and interpreted: 05/02/2021: Total cholesterol 144, HDL 38, LDL 84, triglycerides 117, ALT 21  Risk Assessment/Calculations:    CHA2DS2-VASc Score = 6   This indicates a 9.7% annual risk of stroke. The patient's score is based upon: CHF History: 1 HTN History: 1 Diabetes History: 1 Stroke History: 0 Vascular Disease History:  1 Age Score: 2 Gender Score: 0        Physical Exam:    VS:  BP 122/62 (BP Location: Right Arm,  Patient Position: Sitting, Cuff Size: Normal)    Pulse 88    Ht _0  (1.676 m)    Wt 192 lb 12.8 oz (87.5 kg)    SpO2 93%    BMI 31.12 kg/m     Wt Readings from Last 3 Encounters:  11/06/21 192 lb 12.8 oz (87.5 kg)  10/22/21 192 lb 12.8 oz (87.5 kg)  08/30/21 189 lb (85.7 kg)    Constitutional:      Appearance: Healthy appearance. Not in distress.  Neck:     Vascular: No JVR. JVD normal.  Pulmonary:     Effort: Pulmonary effort is normal.     Breath sounds: Normal breath sounds. No wheezing. No rales.  Cardiovascular:     Normal rate. Regular rhythm.     Murmurs: There is no murmur.  Edema:    Peripheral edema absent.  Abdominal:     Palpations: Abdomen is soft.  Skin:    General: Skin is warm and dry.  Neurological:     General: No focal deficit present.     Mental Status: Alert and oriented to person, place and time.        ASSESSMENT & PLAN:   Dyspnea Etiology is not clear.  He has PFTs pending with pulmonology in August.  He did not have improvement in his symptoms with septoplasty.  Myoview in 08/2021 was low risk and neg for ischemia.  He is not having chest pain or his angina equiv (bilat neck pain).  RHC in 9/22 demonstrated low filling pressures and his diuretic dose was reduced.  I have recommended that he undergo cardiopulmonary stress test to better define his dyspnea. Arrange CPET Check BNP, CMET  Coronary artery disease involving native coronary artery of native heart without angina pectoris He has fairly chronic dyspnea on exertion.  He has not had chest pain or his anginal equivalent.  His recent Myoview was low risk.  Proceed with CPET as noted.  He is not on aspirin as he is on Apixaban.  Continue atorvastatin 40 mg daily.  Follow-up 3 months.  Essential hypertension Blood pressure well controlled.  He is not currently on medical therapy.  Hyperlipidemia  LDL goal <70 LDL on most recent labs above goal at 84.  Continue atorvastatin 40 mg daily.  If his LDL remains above 70, consider increasing atorvastatin versus adding ezetimibe. CMET, Fasting Lipids today   Paroxysmal atrial fibrillation ([name]) He is maintaining sinus rhythm.  Continue apixaban 5 mg twice daily.  His weight is over 60 kg and he is under 76 years old.  Recent hemoglobin normal.  He has a history of tachybradycardia syndrome.  He is no longer on beta-blocker therapy.  He has not had further syncope.  Chronic heart failure with preserved ejection fraction (HFpEF) ([name]) Volume status appears stable.  He is NYHA IIb-III.  Continue furosemide 20 mg daily.  GFR has been above 30.  Consider starting SGLT2 inhibitor if CPET indicates circulatory limitations related to heart failure.  Mild aortic stenosis I could not appreciate a murmur on exam today.  His last echocardiogram in 8/22 demonstrated a mean gradient of 11.7 mmHg.  Plan follow-up echocardiogram in the next 1 to 2 years.  CKD (chronic kidney disease) stage 4, GFR 15-29 ml/min ([name]) He is followed by Dr. Justin Mend with nephrology.           Dispo:  Return in about 3 months (around 02/03/2022) for Routine follow up in 3 months with Dr. Johney Hess, or  [name], [name].   Medication Adjustments/Labs and Tests Ordered: Current medicines are reviewed at length with the patient today.  Concerns regarding medicines are outlined above.  Tests Ordered: Orders Placed This Encounter  Procedures   Cardiopulmonary exercise test   Comp Met (CMET)   Lipid Profile   Pro b natriuretic peptide   EKG 12-Lead   Medication Changes: No orders of the defined types were placed in this encounter.  Signed, [name], [name]  11/06/2021 5:18 PM    [facility] [place], [place], [place]  [address] Phone: [phone]; Fax: [phone]

## 2021-11-06 ENCOUNTER — Ambulatory Visit (INDEPENDENT_AMBULATORY_CARE_PROVIDER_SITE_OTHER): Payer: Medicare Other | Admitting: Physician Assistant

## 2021-11-06 ENCOUNTER — Encounter: Payer: Self-pay | Admitting: Physician Assistant

## 2021-11-06 ENCOUNTER — Other Ambulatory Visit: Payer: Self-pay

## 2021-11-06 VITALS — BP 122/62 | HR 88 | Ht 66.0 in | Wt 192.8 lb

## 2021-11-06 DIAGNOSIS — I10 Essential (primary) hypertension: Secondary | ICD-10-CM | POA: Diagnosis not present

## 2021-11-06 DIAGNOSIS — I480 Paroxysmal atrial fibrillation: Secondary | ICD-10-CM

## 2021-11-06 DIAGNOSIS — R0602 Shortness of breath: Secondary | ICD-10-CM

## 2021-11-06 DIAGNOSIS — I2510 Atherosclerotic heart disease of native coronary artery without angina pectoris: Secondary | ICD-10-CM | POA: Diagnosis not present

## 2021-11-06 DIAGNOSIS — E785 Hyperlipidemia, unspecified: Secondary | ICD-10-CM

## 2021-11-06 DIAGNOSIS — I350 Nonrheumatic aortic (valve) stenosis: Secondary | ICD-10-CM | POA: Diagnosis not present

## 2021-11-06 DIAGNOSIS — I5032 Chronic diastolic (congestive) heart failure: Secondary | ICD-10-CM

## 2021-11-06 DIAGNOSIS — N184 Chronic kidney disease, stage 4 (severe): Secondary | ICD-10-CM

## 2021-11-06 NOTE — Assessment & Plan Note (Signed)
Volume status appears stable.  He is NYHA IIb-III.  Continue furosemide 20 mg daily.  GFR has been above 30.  Consider starting SGLT2 inhibitor if CPET indicates circulatory limitations related to heart failure.

## 2021-11-06 NOTE — Assessment & Plan Note (Signed)
He has fairly chronic dyspnea on exertion.  He has not had chest pain or his anginal equivalent.  His recent Myoview was low risk.  Proceed with CPET as noted.  He is not on aspirin as he is on Apixaban.  Continue atorvastatin 40 mg daily.  Follow-up 3 months.

## 2021-11-06 NOTE — Assessment & Plan Note (Addendum)
LDL on most recent labs above goal at 84.  Continue atorvastatin 40 mg daily.  If his LDL remains above 70, consider increasing atorvastatin versus adding ezetimibe.  CMET, Fasting Lipids today

## 2021-11-06 NOTE — Assessment & Plan Note (Signed)
He is followed by Dr. Justin Mend with nephrology.

## 2021-11-06 NOTE — Assessment & Plan Note (Addendum)
Etiology is not clear.  He has PFTs pending with pulmonology in August.  He did not have improvement in his symptoms with septoplasty.  Myoview in 08/2021 was low risk and neg for ischemia.  He is not having chest pain or his angina equiv (bilat neck pain).  RHC in 9/22 demonstrated low filling pressures and his diuretic dose was reduced.  I have recommended that he undergo cardiopulmonary stress test to better define his dyspnea.  Arrange CPET  Check BNP, CMET

## 2021-11-06 NOTE — Assessment & Plan Note (Addendum)
He is maintaining sinus rhythm.  Continue apixaban 5 mg twice daily.  His weight is over 60 kg and he is under 78 years old.  Recent hemoglobin normal.  He has a history of tachybradycardia syndrome.  He is no longer on beta-blocker therapy.  He has not had further syncope.

## 2021-11-06 NOTE — Assessment & Plan Note (Signed)
I could not appreciate a murmur on exam today.  His last echocardiogram in 8/22 demonstrated a mean gradient of 11.7 mmHg.  Plan follow-up echocardiogram in the next 1 to 2 years.

## 2021-11-06 NOTE — Patient Instructions (Signed)
Medication Instructions:   Your physician recommends that you continue on your current medications as directed. Please refer to the Current Medication list given to you today.  *If you need a refill on your cardiac medications before your next appointment, please call your pharmacy*   Lab Work:  TODAY!!!!! CMET/LIPID/PRO BNP  If you have labs (blood work) drawn today and your tests are completely normal, you will receive your results only by: [place] (if you have MyChart) OR A paper copy in the mail If you have any lab test that is abnormal or we need to change your treatment, we will call you to review the results.   Testing/Procedures:  Your physician has recommended that you have a cardiopulmonary stress test (CPX). CPX testing is a non-invasive measurement of heart and lung function. It replaces a traditional treadmill stress test. This type of test provides a tremendous amount of information that relates not only to your present condition but also for future outcomes. This test combines measurements of you ventilation, respiratory gas exchange in the lungs, electrocardiogram (EKG), blood pressure and physical response before, during, and following an exercise protocol.    Follow-Up: At [facility], you and your health needs are our priority.  As part of our continuing mission to provide you with exceptional heart care, we have created designated Provider Care Teams.  These Care Teams include your primary Cardiologist (physician) and Advanced Practice Providers (APPs -  Physician Assistants and Nurse Practitioners) who all work together to provide you with the care you need, when you need it.  We recommend signing up for the patient portal called "MyChart".  Sign up information is provided on this After Visit Summary.  MyChart is used to connect with patients for Virtual Visits (Telemedicine).  Patients are able to view lab/test results, encounter notes, upcoming  appointments, etc.  Non-urgent messages can be sent to your provider as well.   To learn more about what you can do with MyChart, go to [email or web address].    Your next appointment:   3 month(s)  The format for your next appointment:   In Person  Provider:   Freada Bergeron, MD     Other Instructions  When you follow up with your PCP next week please discuss your difficulty swallowing.

## 2021-11-06 NOTE — Assessment & Plan Note (Signed)
Blood pressure well controlled.  He is not currently on medical therapy.

## 2021-11-07 ENCOUNTER — Telehealth: Payer: Self-pay

## 2021-11-07 DIAGNOSIS — E785 Hyperlipidemia, unspecified: Secondary | ICD-10-CM

## 2021-11-07 DIAGNOSIS — I2510 Atherosclerotic heart disease of native coronary artery without angina pectoris: Secondary | ICD-10-CM

## 2021-11-07 DIAGNOSIS — Z79899 Other long term (current) drug therapy: Secondary | ICD-10-CM

## 2021-11-07 LAB — LIPID PANEL
Chol/HDL Ratio: 3.1 ratio (ref 0.0–5.0)
Cholesterol, Total: 137 mg/dL (ref 100–199)
HDL: 44 mg/dL (ref >39)
LDL Chol Calc (NIH): 75 mg/dL (ref 0–99)
Triglycerides: 99 mg/dL (ref 0–149)
VLDL Cholesterol Cal: 18 mg/dL (ref 5–40)

## 2021-11-07 LAB — COMPREHENSIVE METABOLIC PANEL
ALT: 20 IU/L (ref 0–44)
AST: 19 IU/L (ref 0–40)
Albumin/Globulin Ratio: 1.8 (ref 1.2–2.2)
Albumin: 4.3 g/dL (ref 3.7–4.7)
Alkaline Phosphatase: 173 IU/L — ABNORMAL HIGH (ref 44–121)
BUN/Creatinine Ratio: 17 (ref 10–24)
BUN: 34 mg/dL — ABNORMAL HIGH (ref 8–27)
Bilirubin Total: 0.7 mg/dL (ref 0.0–1.2)
CO2: 24 mmol/L (ref 20–29)
Calcium: 9.1 mg/dL (ref 8.6–10.2)
Chloride: 100 mmol/L (ref 96–106)
Creatinine, Ser: 1.99 mg/dL — ABNORMAL HIGH (ref 0.76–1.27)
Globulin, Total: 2.4 g/dL (ref 1.5–4.5)
Glucose: 314 mg/dL — ABNORMAL HIGH (ref 70–99)
Potassium: 5 mmol/L (ref 3.5–5.2)
Sodium: 138 mmol/L (ref 134–144)
Total Protein: 6.7 g/dL (ref 6.0–8.5)
eGFR: 34 mL/min/{1.73_m2} — ABNORMAL LOW (ref >59)

## 2021-11-07 LAB — PRO B NATRIURETIC PEPTIDE: NT-Pro BNP: 661 pg/mL — ABNORMAL HIGH (ref 0–486)

## 2021-11-07 MED ORDER — ATORVASTATIN CALCIUM 80 MG PO TABS: 80.0000 mg | ORAL_TABLET | Freq: Every day | ORAL | 3 refills | Status: DC

## 2021-11-07 NOTE — Telephone Encounter (Signed)
Spoke to patient and relayed all recommendations by [name]. Sent lab results to PCP (ross) for glucose f/u and ALP management. Scheduled pt for repeat labs 5/10 prior to his appt with [place]. Lipitor 80mg  sent into pharmacy on file. Pt will start new dose today.

## 2021-11-07 NOTE — Telephone Encounter (Signed)
-----  Message from Liliane Shi, [name] sent at 11/07/2021  8:55 AM EST ----- Glucose high. Creatinine stable.  K+, AST, ALT normal.  Alk Phos elevated.  BNP not significantly elevated.  LDL above goal.  PLAN:  -F/u with PCP for diabetes  -F/u with PCP for elevated alk phos - fax labs to PCP -Continue current dose of furosemide -Increase Atorvastatin to 80 mg once daily  -Lipids, LFTs in 3 mos  [name], [name]    11/07/2021 8:46 AM

## 2021-11-13 DIAGNOSIS — E7800 Pure hypercholesterolemia, unspecified: Secondary | ICD-10-CM | POA: Insufficient documentation

## 2021-11-13 DIAGNOSIS — K5900 Constipation, unspecified: Secondary | ICD-10-CM | POA: Insufficient documentation

## 2021-11-13 DIAGNOSIS — E113313 Type 2 diabetes mellitus with moderate nonproliferative diabetic retinopathy with macular edema, bilateral: Secondary | ICD-10-CM | POA: Insufficient documentation

## 2021-11-13 DIAGNOSIS — E119 Type 2 diabetes mellitus without complications: Secondary | ICD-10-CM | POA: Insufficient documentation

## 2021-11-13 DIAGNOSIS — I499 Cardiac arrhythmia, unspecified: Secondary | ICD-10-CM | POA: Insufficient documentation

## 2021-11-13 DIAGNOSIS — E1169 Type 2 diabetes mellitus with other specified complication: Secondary | ICD-10-CM | POA: Insufficient documentation

## 2021-11-13 DIAGNOSIS — E1121 Type 2 diabetes mellitus with diabetic nephropathy: Secondary | ICD-10-CM | POA: Insufficient documentation

## 2021-11-13 DIAGNOSIS — Z794 Long term (current) use of insulin: Secondary | ICD-10-CM | POA: Insufficient documentation

## 2021-11-13 DIAGNOSIS — G453 Amaurosis fugax: Secondary | ICD-10-CM | POA: Insufficient documentation

## 2021-11-13 DIAGNOSIS — R413 Other amnesia: Secondary | ICD-10-CM | POA: Insufficient documentation

## 2021-11-13 DIAGNOSIS — I509 Heart failure, unspecified: Secondary | ICD-10-CM | POA: Insufficient documentation

## 2021-11-13 DIAGNOSIS — G479 Sleep disorder, unspecified: Secondary | ICD-10-CM | POA: Insufficient documentation

## 2021-11-13 DIAGNOSIS — F430 Acute stress reaction: Secondary | ICD-10-CM

## 2021-11-13 DIAGNOSIS — E1165 Type 2 diabetes mellitus with hyperglycemia: Secondary | ICD-10-CM | POA: Insufficient documentation

## 2021-11-13 DIAGNOSIS — E162 Hypoglycemia, unspecified: Secondary | ICD-10-CM | POA: Insufficient documentation

## 2021-11-13 DIAGNOSIS — F419 Anxiety disorder, unspecified: Secondary | ICD-10-CM | POA: Insufficient documentation

## 2021-11-13 DIAGNOSIS — H9190 Unspecified hearing loss, unspecified ear: Secondary | ICD-10-CM | POA: Insufficient documentation

## 2021-11-13 DIAGNOSIS — N2581 Secondary hyperparathyroidism of renal origin: Secondary | ICD-10-CM | POA: Insufficient documentation

## 2021-11-13 DIAGNOSIS — R296 Repeated falls: Secondary | ICD-10-CM | POA: Insufficient documentation

## 2021-11-13 DIAGNOSIS — F322 Major depressive disorder, single episode, severe without psychotic features: Secondary | ICD-10-CM | POA: Insufficient documentation

## 2021-11-13 DIAGNOSIS — D6859 Other primary thrombophilia: Secondary | ICD-10-CM | POA: Insufficient documentation

## 2021-11-13 HISTORY — DX: Acute stress reaction: F43.0

## 2021-11-15 ENCOUNTER — Ambulatory Visit (INDEPENDENT_AMBULATORY_CARE_PROVIDER_SITE_OTHER): Payer: Medicare Other | Admitting: Nurse Practitioner

## 2021-11-15 ENCOUNTER — Telehealth: Payer: Self-pay

## 2021-11-15 ENCOUNTER — Encounter: Payer: Self-pay | Admitting: Nurse Practitioner

## 2021-11-15 VITALS — BP 110/58 | HR 84 | Ht 64.0 in | Wt 190.2 lb

## 2021-11-15 DIAGNOSIS — R0602 Shortness of breath: Secondary | ICD-10-CM

## 2021-11-15 DIAGNOSIS — R1310 Dysphagia, unspecified: Secondary | ICD-10-CM

## 2021-11-15 DIAGNOSIS — I509 Heart failure, unspecified: Secondary | ICD-10-CM | POA: Diagnosis not present

## 2021-11-15 DIAGNOSIS — Z7901 Long term (current) use of anticoagulants: Secondary | ICD-10-CM | POA: Diagnosis not present

## 2021-11-15 NOTE — Patient Instructions (Signed)
RECOMMENDATIONS: ? ?EGD- we must obtain full cardiac clearance and instructions on holding your Eliquis before we can schedule your procedure. We will contact cardiology after your upcoming test. ? ? ?Thank you for trusting me with your gastrointestinal care!   ? ?Noralyn Pick, CRNP ? ? ? ?BMI: ? ?If you are age 78 or older, your body mass index should be between 23-30. Your Body mass index is 32.66 kg/m?[name] If this is out of the aforementioned range listed, please consider follow up with your Primary Care Provider. ? ?If you are age 25 or younger, your body mass index should be between 19-25. Your Body mass index is 32.66 kg/m?[name] If this is out of the aformentioned range listed, please consider follow up with your Primary Care Provider.  ? ?MY CHART: ? ?The [facility] providers would like to encourage you to use [facility] to communicate with providers for non-urgent requests or questions.  Due to long hold times on the telephone, sending your provider a message by [facility] may be a faster and more efficient way to get a response.  Please allow 48 business hours for a response.  Please remember that this is for non-urgent requests.  ? ?

## 2021-11-15 NOTE — Telephone Encounter (Signed)
error 

## 2021-11-15 NOTE — Progress Notes (Signed)
11/15/2021 Fernando Hess [id number] 1945-11-28   Chief Complaint: Difficulty swallowing   History of Present Illness: Fernando Hess is a 76 year old male with a past medical history of coronary artery disease s/p MI 09/2015 s/p 5 vessel s/p CABG 09/2015 and STEMI 10/2015, HFpEF, paroxysmal atrial fibrillation on Eliquis, CKD stage IV, dysphagia and colon polyps.   He presents to our office today for  further evaluation regarding dysphagia. He is accompanied by his wife. He complains of having food which gets stuck in his throat/upper esophagus which occurs several days weekly for the past 6 months. He typically drinks water and the stuck food passes down the esophagus. He sometimes has difficulty swallowing liquids, coughs after he drinks water. Infrequent heartburn. He takes Pantoprazole 40mg  QD. He underwent a  barium swallow 07/18/2020 which showed a small hiatal hernia and a nonrestrictive distal esophageal ring. He underwent an EGD 05/31/2019 by Dr. 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 He has daily SOB since 07/2021. He takes shallow frequent breaths with minimal exertion. No associated CP or dizziness. He saw [name] cardiology [name] 11/06/2021 and due to his SOB he was scheduled for a cardiopulmonary exercise test 11/20/2021. Myoview 08/16/2021 without ischemia or infarction, EF 55 %, low risk. ECHO 04/2021 showed LV EF 60 - 65%.  CBC Latest Ref Rng & Units 08/27/2021 06/06/2021 06/06/2021  WBC 4.0 - 10.5 K/uL 11.9(H) - -  Hemoglobin 13.0 - 17.0 g/dL 14.5 15.3 14.6  Hematocrit 39.0 - 52.0 % 44.3 45.0 43.0  Platelets 150 - 400 K/uL 247 - -    CMP Latest Ref Rng & Units 11/06/2021 08/27/2021 06/06/2021  Glucose 70 - 99 mg/dL 314(H) 127(H) -  BUN 8 - 27 mg/dL 34(H) 23 -  Creatinine 0.76 - 1.27 mg/dL 1.99(H) 1.91(H) -  Sodium 134 - 144 mmol/L 138 138 138  Potassium 3.5 - 5.2 mmol/L 5.0 3.8 4.6  Chloride 96 - 106 mmol/L 100 104 -   CO2 20 - 29 mmol/L 24 26 -  Calcium 8.6 - 10.2 mg/dL 9.1 8.9 -  Total Protein 6.0 - 8.5 g/dL 6.7 - -  Total Bilirubin 0.0 - 1.2 mg/dL 0.7 - -  Alkaline Phos 44 - 121 IU/L 173(H) - -  AST 0 - 40 IU/L 19 - -  ALT 0 - 44 IU/L 20 - -    Barium Swallow 07/18/2020:  1. Small reducible hiatal hernia with associated non restrictive distal esophageal ring. 2. No observed gastroesophageal reflux or focal mucosal abnormality. 3. Barium tablet passed without delay into the stomach.  EGD 05/31/2019: - Web in the upper third of the esophagus. Dilated. - Gastroduodenitis. Biopsied. - Normal examined otherwise.  Colonoscopy 02/21/2019: Three 2 to 3 mm polyps in the ascending colon, removed with a cold snare. Resected and retrieved. - Diverticulosis in the left colon. - The examination was otherwise normal on direct and retroflexion views. - Recall colonoscopy 5 years - TUBULAR ADENOMA (1 OF 6 FRAGMENTS) - BENIGN COLONIC MUCOSA (5 OF 6 FRAGMENTS) - NO HIGH GRADE DYSPLASIA OR MALIGNANCY IDENTIFIED  Current Outpatient Medications on File Prior to Visit  Medication Sig Dispense Refill   acetaminophen (TYLENOL) 325 MG tablet Take 325 mg by mouth every 6 (six) hours as needed for headache (pain).     ALPRAZolam (XANAX) 0.25 MG tablet Take for anxiety 1 hour before PFT 1 tablet 0   atorvastatin (LIPITOR) 80 MG  tablet Take 1 tablet (80 mg total) by mouth daily. 90 tablet 3   B-D UF III MINI PEN NEEDLES 31G X 5 MM MISC USE AS DIRECTED WITH LANTUS SOLOSTAR     DULoxetine (CYMBALTA) 30 MG capsule Take 30 mg by mouth every morning. Take with a 60 mg capsule for a total morning dose of 90 mg     DULoxetine (CYMBALTA) 60 MG capsule Take 60 mg by mouth every morning. Take with a 30 mg capsule for a total morning dose of 90 mg  4   ELIQUIS 5 MG TABS tablet TAKE ONE TABLET BY MOUTH EVERY MORNING and TAKE ONE TABLET BY MOUTH EVERY EVENING 180 tablet 1   furosemide (LASIX) 20 MG tablet Take 20 mg by mouth daily.      hydrOXYzine (VISTARIL) 25 MG capsule Take 25 mg by mouth at bedtime as needed for itching (sleep).     Insulin Glargine (BASAGLAR KWIKPEN) 100 UNIT/ML SOPN Inject 40 Units into the skin daily.     insulin lispro (HUMALOG) 100 UNIT/ML KwikPen Inject 14 Units into the skin 3 (three) times daily.     MAGNESIUM PO Take 350 mg by mouth every evening.     Melatonin 3 MG TABS Take 3 mg by mouth at bedtime.     Multiple Vitamin (MULTIVITAMIN WITH MINERALS) TABS tablet Take 1 tablet by mouth every morning.     ondansetron (ZOFRAN-ODT) 4 MG disintegrating tablet Take 4 mg by mouth every 8 (eight) hours as needed for nausea or vomiting.     pantoprazole (PROTONIX) 40 MG tablet TAKE 1 TABLET BY MOUTH EVERY DAY 90 tablet 2   zaleplon ([name]) 5 MG capsule Take 1 capsule (5 mg total) by mouth at bedtime. 30 capsule 5   nitroGLYCERIN (NITROSTAT) 0.4 MG SL tablet Place 0.4 mg under the tongue every 5 (five) minutes as needed for chest pain. (Patient not taking: Reported on 11/15/2021)     Current Facility-Administered Medications on File Prior to Visit  Medication Dose Route Frequency Provider Last Rate Last Admin   sodium chloride flush (NS) 0.9 % injection 3 mL  3 mL Intravenous Q12H Chandrasekhar, Mahesh A, MD       Allergies  Allergen Reactions   Cilostazol Swelling    edema Other reaction(s): edema   Dulaglutide Nausea And Vomiting    Other reaction(s): vomiting   Levofloxacin Itching and Rash    Other reaction(s): hives   Liraglutide Other (See Comments)    Severe fatigue & insomnia Other reaction(s): severe fatigue and insomnia   Lisinopril Itching and Rash    Other reaction(s): cough   Current Medications, Allergies, Past Medical History, Past Surgical History, Family History and Social History were reviewed in [employer] record.   Review of Systems:   Constitutional: Negative for fever, sweats, chills or weight loss.  Respiratory: + SOB. Occasional nonproductive  cough. No hemoptysis.  Cardiovascular: Negative for chest pain, palpitations and leg swelling.  Gastrointestinal: See HPI.  Musculoskeletal: Negative for back pain or muscle aches.  Neurological: Negative for dizziness, headaches or paresthesias.    Physical Exam: BP (!) 110/58 (BP Location: Left Arm, Patient Position: Sitting, Cuff Size: Normal)    Pulse 84 Comment: irregular   Ht 5\' 4"  (1.626 m) Comment: height measured without shoes   Wt 190 lb 4 oz (86.3 kg)    BMI 32.66 kg/m  General: 76 year old male in NAD. Head: Normocephalic and atraumatic. Eyes: No scleral icterus. Conjunctiva pink .  Ears: Normal auditory acuity. Mouth: Dentition intact. No ulcers or lesions.  Lungs: Clear throughout to auscultation. Heart: Regular rate and rhythm, no murmur. Chest: Mediastinal scar intact.  Abdomen: Soft, nontender and nondistended. No masses or hepatomegaly. Normal bowel sounds x 4 quadrants.  Rectal: Deferred.  Musculoskeletal: Symmetrical with no gross deformities. Extremities: No edema. Neurological: Alert oriented x 4. No focal deficits.  Psychological: Alert and cooperative. 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  -EGD with esophageal dilatation after cardiac clearance received. EGD benefits and risks discussed including risk with sedation, risk of bleeding, perforation and infection  -Cardiac clearance to also include Eliquis instructions prior to EGD -Continue Pantoprazole 40mg  QD  2) SOB at rest and with exertion  -Patient to proceed with cardiopulmonary stress test as scheduled   3) CAD s/jp MI and 5 vessel CABG in 2017 -see plan in # 1 and 2  4) Paroxysmal atrial fibrillation on Eliquis. CHA2DS2-VASc Score = 6   5) HFpEF on Furosemide 20mg  QD  6) History of tubular adenomatous polyps -Next  colonoscopy due 02/2024 if medically appropriate at that time   7) DM II  8) CKD

## 2021-11-17 NOTE — Progress Notes (Signed)
Noted  

## 2021-11-20 ENCOUNTER — Other Ambulatory Visit (HOSPITAL_COMMUNITY): Payer: Self-pay | Admitting: *Deleted

## 2021-11-20 ENCOUNTER — Ambulatory Visit (HOSPITAL_COMMUNITY): Payer: Medicare Other | Attending: Cardiology

## 2021-11-20 ENCOUNTER — Other Ambulatory Visit: Payer: Self-pay

## 2021-11-20 DIAGNOSIS — N184 Chronic kidney disease, stage 4 (severe): Secondary | ICD-10-CM

## 2021-11-20 DIAGNOSIS — R0600 Dyspnea, unspecified: Secondary | ICD-10-CM

## 2021-11-20 DIAGNOSIS — I350 Nonrheumatic aortic (valve) stenosis: Secondary | ICD-10-CM

## 2021-11-20 DIAGNOSIS — I10 Essential (primary) hypertension: Secondary | ICD-10-CM

## 2021-11-20 DIAGNOSIS — I480 Paroxysmal atrial fibrillation: Secondary | ICD-10-CM

## 2021-11-20 DIAGNOSIS — E785 Hyperlipidemia, unspecified: Secondary | ICD-10-CM

## 2021-11-20 DIAGNOSIS — R0602 Shortness of breath: Secondary | ICD-10-CM

## 2021-11-20 DIAGNOSIS — I2510 Atherosclerotic heart disease of native coronary artery without angina pectoris: Secondary | ICD-10-CM

## 2021-11-25 ENCOUNTER — Telehealth: Payer: Self-pay

## 2021-11-25 ENCOUNTER — Telehealth: Payer: Self-pay | Admitting: Nurse Practitioner

## 2021-11-25 NOTE — Telephone Encounter (Signed)
Patient with diagnosis of afib on Eliquis for anticoagulation.   ? ?Procedure: EGD ?Date of procedure: TBD ? ?CHA2DS2-VASc Score = 6  ?This indicates a 9.7% annual risk of stroke. ?The patient's score is based upon: ?CHF History: 1 ?HTN History: 1 ?Diabetes History: 1 ?Stroke History: 0 ?Vascular Disease History: 1 ?Age Score: 2 ?Gender Score: 0 ?  ?CrCl 29m/min ?Platelet count 247K ? ?Per office protocol, patient can hold Eliquis for 1-2 days prior to procedure.   ? ?

## 2021-11-25 NOTE — Telephone Encounter (Signed)
Inbound call from patients wife, she stated that he was seen in the office on 3/3 and he is wanting to schedule an EGD. Patients wife is seeking advice if he can go ahead and do so. Please advise.   ?

## 2021-11-25 NOTE — Telephone Encounter (Signed)
 [facility] Pre-operative Risk Assessment  ?   ?Fernando Hess ?Sep 14, 1946 ?[id number] ? ?Procedure: EGD ?Anesthesia type:  MAC ?Procedure Date: TBD after clearance ?Provider: Dr. Henrene Pastor ? ?Type of Clearance needed: Pharmacy AND Cardiac clearance ? ?Medication(s) needing held: Eliquis  ? ?Length of time for medication to be held: 1-2 days ? ?Please review request and advise by either responding to this message or by sending your response to the fax # provided below. ? ?Thank you, ? ?[facility]  ?Phone: [phone] ?Fax: [phone] ?ATTENTION: Kyisha Fowle, LPN ? ? ? ?

## 2021-11-25 NOTE — Telephone Encounter (Signed)
I have submitted the request for cardiac clearance and the request to hold Eliquis. Will schedule EGD with Dr. Henrene Pastor once we have received pt clearance. ?

## 2021-11-25 NOTE — Telephone Encounter (Signed)
Clinical pharmacist to review Eliquis 

## 2021-11-25 NOTE — Progress Notes (Signed)
Pt has been made aware of normal result and verbalized understanding.  jw

## 2021-11-25 NOTE — Telephone Encounter (Signed)
Fernando Hess, thank you for keeping track of his cardiac clearance and Eliquis hold instructions. Pls let me know when cardiac clearance received and when procedure scheduled. THX ?

## 2021-11-26 NOTE — Telephone Encounter (Signed)
Patient has completed his Cardiopulmonary exercise test ? ?

## 2021-11-27 ENCOUNTER — Other Ambulatory Visit: Payer: Self-pay

## 2021-11-27 DIAGNOSIS — R1310 Dysphagia, unspecified: Secondary | ICD-10-CM

## 2021-11-27 NOTE — Telephone Encounter (Signed)
Clearance and instructions for holding Eliquis has been received. Called pt to schedule EGD @ [place] with Dr. Henrene Pastor, next available. LVM requesting returned call. ?

## 2021-11-27 NOTE — Telephone Encounter (Signed)
Pt returned call. Scheduled for EGD at [facility] on 4/4 @ 2pm, arrival time 1pm. Amb referral placed for auth purposes. Prep instructions sent via My Chart per pt request WITH Eliquis instructions included. Routing this message to Carl Best, NP per her request. ?

## 2021-11-27 NOTE — Telephone Encounter (Signed)
Pt made aware of Almyra Deforest, PA's instructions re: holding Eliquis. Instructions also included in pt prep instructions. Routing this message to Carl Best, NP per her request. ?

## 2021-11-27 NOTE — Telephone Encounter (Signed)
? ?  Name: Fernando Hess  ?DOB: June 27, 1946  ?MRN: [id number]  ? ?Primary Cardiologist: Freada Bergeron, MD ? ?Chart reviewed as part of pre-operative protocol coverage. Patient was contacted 11/27/2021 in reference to pre-operative risk assessment for pending surgery as outlined below.  [name] was last seen on 11/06/2021 by [name], [name].  Since that day, [name] has done well without exertional chest pain or worsening dyspnea.  Recent Myoview in December 2022 and cardiopulmonary stress test obtained recently was reassuring. ? ?Therefore, based on ACC/AHA guidelines, the patient would be at acceptable risk for the planned procedure without further cardiovascular testing.  ? ?Patient has been instructed to hold Eliquis for 2 days prior to the procedure and restart as soon as possible afterward at the surgeon's discretion. ? ?The patient was advised that if he develops new symptoms prior to surgery to contact our office to arrange for a follow-up visit, and he verbalized understanding. ? ?I will route this recommendation to the requesting party via Epic fax function and remove from pre-op pool. Please call with questions. ? ?Almyra Deforest, [place] ?11/27/2021, 1:52 PM ? ?

## 2021-12-06 IMAGING — DX DG CHEST 2V
2 series · 2 of 2 positions shown · non-contrast
Comparison: 12/01/2016

CLINICAL DATA: Shortness of breath

EXAM:
CHEST - 2 VIEW

[w chest pa]
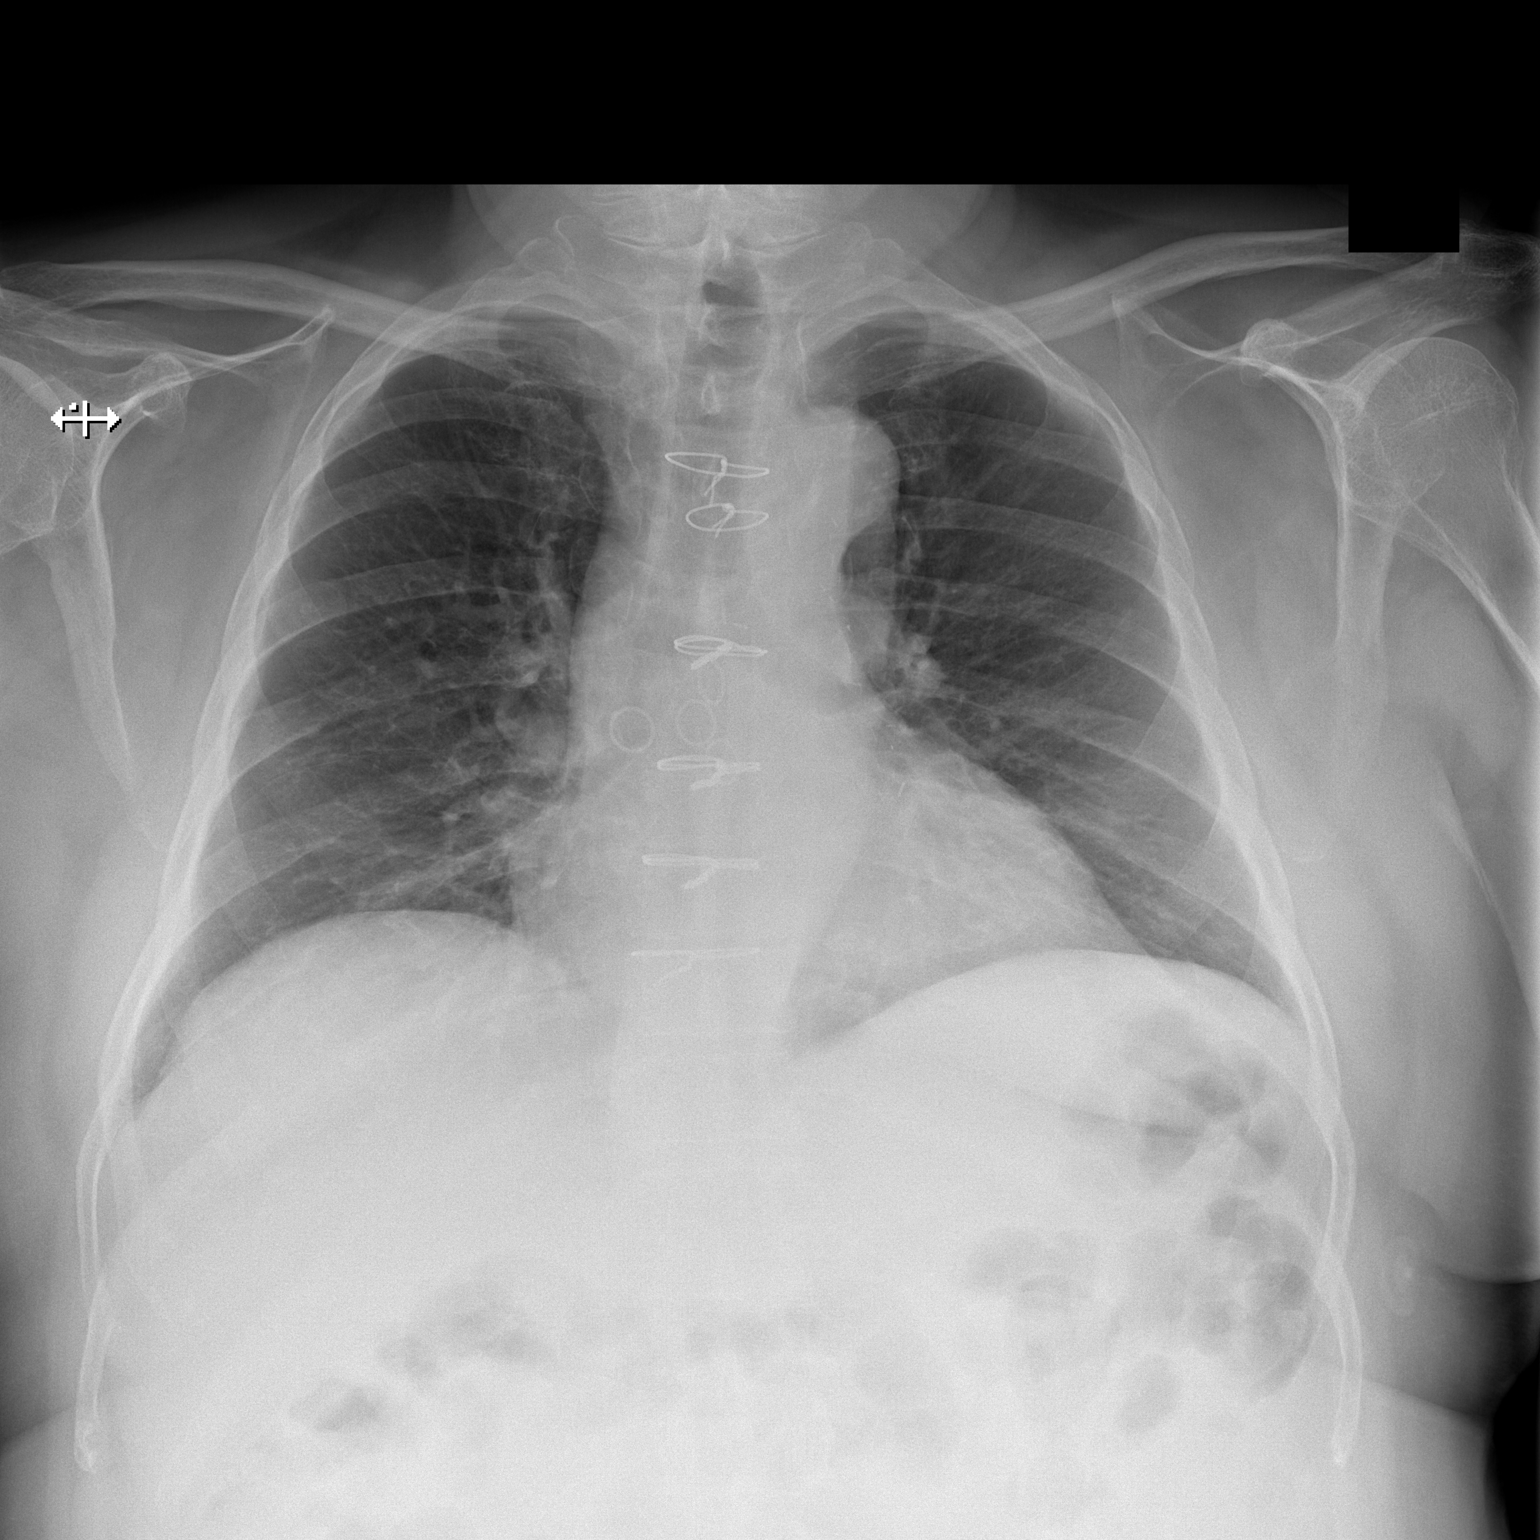

[w chest lat]
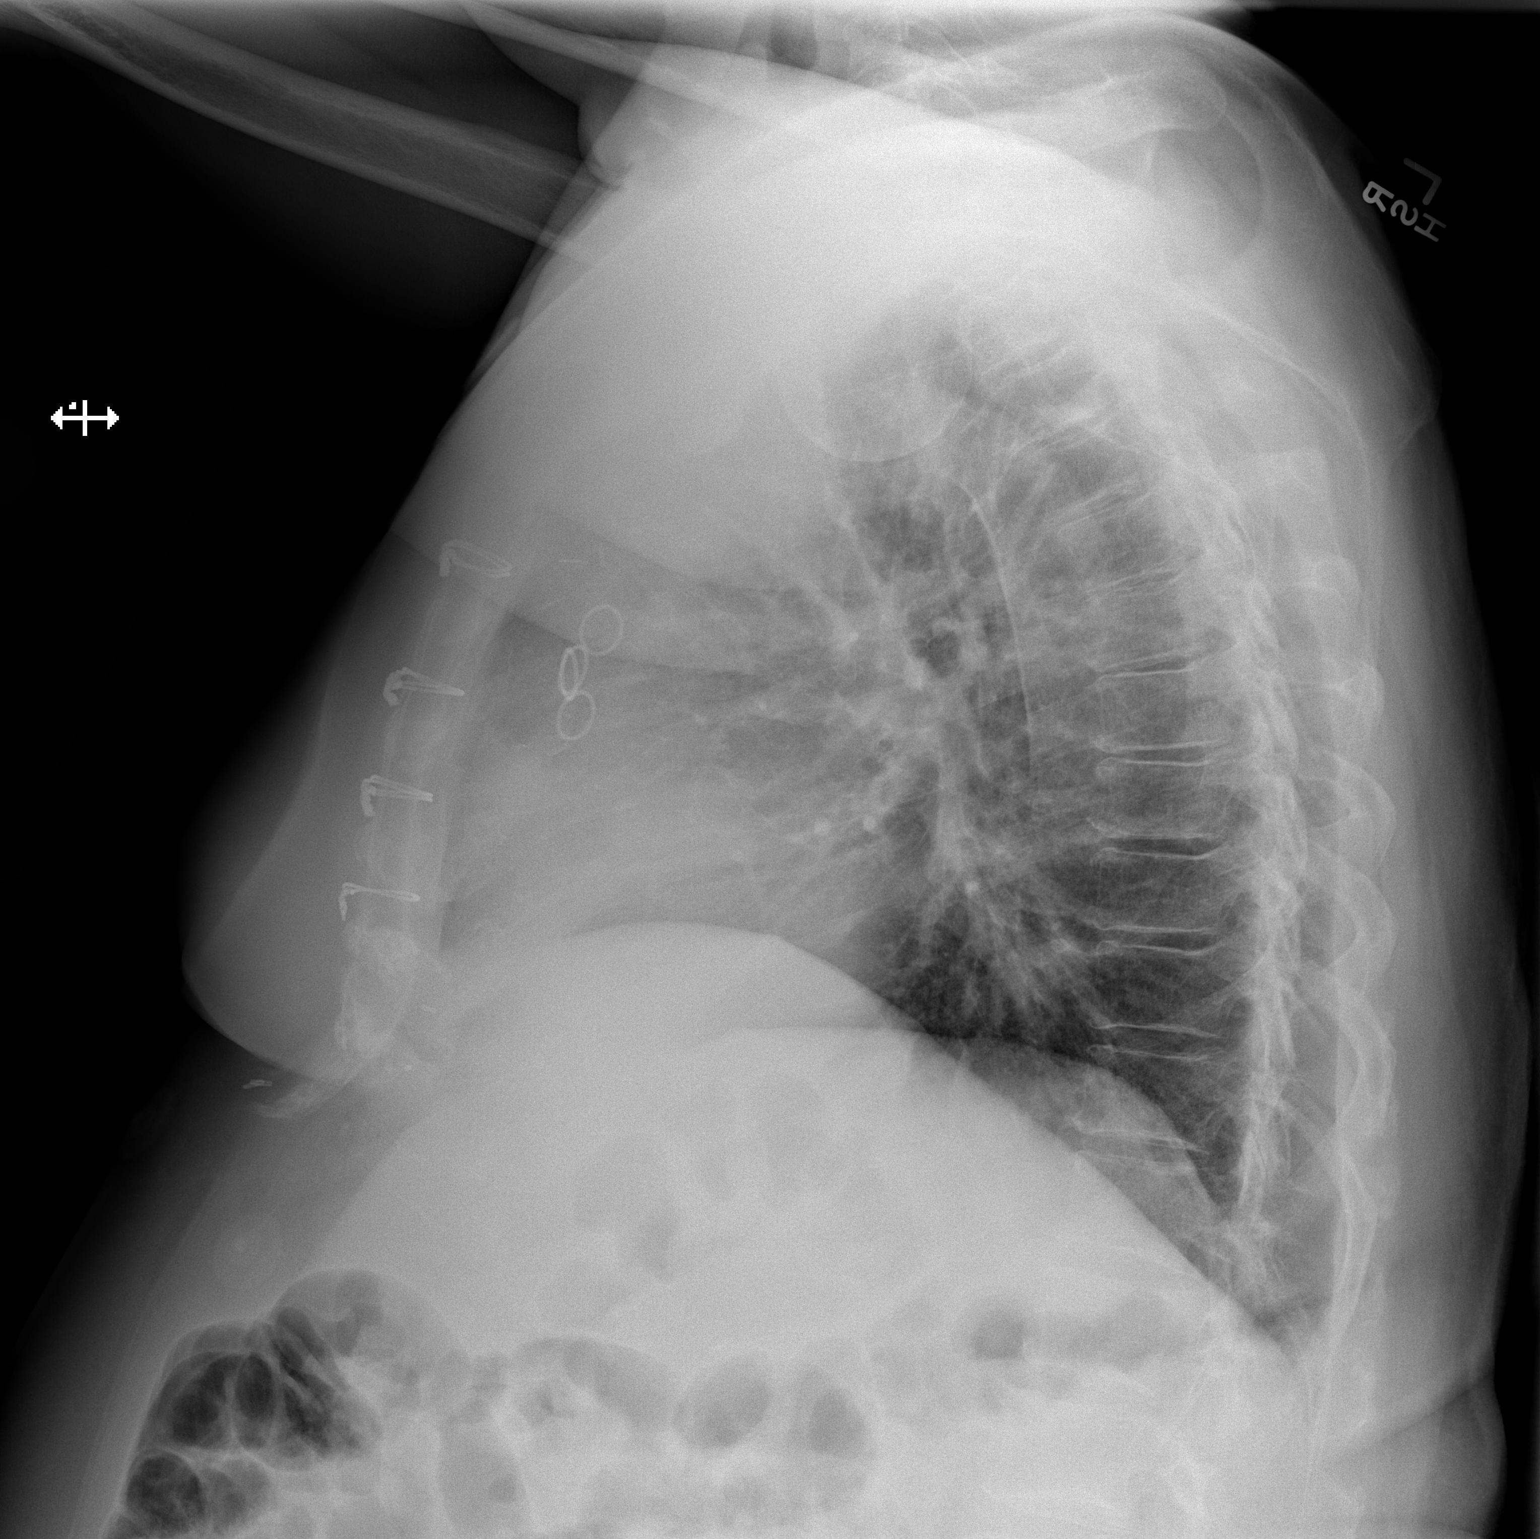

[2 of 2 positions shown; findings below may reference images not displayed]

FINDINGS: Prior CABG. The heart size and mediastinal contours are within
normal limits. No focal airspace consolidation, pleural effusion, or
pneumothorax. The visualized skeletal structures are unremarkable.
IMPRESSION: No active cardiopulmonary disease.

## 2021-12-11 ENCOUNTER — Ambulatory Visit: Payer: Medicare Other | Admitting: Podiatry

## 2021-12-11 NOTE — Telephone Encounter (Signed)
Yes, patient was cleared. The cardiopulmonary stress test was suboptimal but suggested patient was limited due to deconditioning rather than cardiac limitation. Recent nuclear stress test in Dec 2022 shows no reversible ischemia.  ?

## 2021-12-13 ENCOUNTER — Ambulatory Visit (INDEPENDENT_AMBULATORY_CARE_PROVIDER_SITE_OTHER): Payer: Medicare Other | Admitting: Podiatry

## 2021-12-13 DIAGNOSIS — M2042 Other hammer toe(s) (acquired), left foot: Secondary | ICD-10-CM | POA: Diagnosis not present

## 2021-12-13 DIAGNOSIS — M2011 Hallux valgus (acquired), right foot: Secondary | ICD-10-CM

## 2021-12-13 DIAGNOSIS — M2041 Other hammer toe(s) (acquired), right foot: Secondary | ICD-10-CM | POA: Diagnosis not present

## 2021-12-13 DIAGNOSIS — E1151 Type 2 diabetes mellitus with diabetic peripheral angiopathy without gangrene: Secondary | ICD-10-CM | POA: Diagnosis not present

## 2021-12-13 DIAGNOSIS — M2012 Hallux valgus (acquired), left foot: Secondary | ICD-10-CM

## 2021-12-13 DIAGNOSIS — M79674 Pain in right toe(s): Secondary | ICD-10-CM

## 2021-12-13 DIAGNOSIS — M79675 Pain in left toe(s): Secondary | ICD-10-CM

## 2021-12-13 DIAGNOSIS — B351 Tinea unguium: Secondary | ICD-10-CM

## 2021-12-13 NOTE — Patient Instructions (Addendum)

## 2021-12-13 NOTE — Progress Notes (Signed)
Subjective: ?Fernando Hess presents today for diabetic foot evaluation and thick, elongated toenails 1-5 bilaterally which are tender when wearing enclosed shoe gear.. ? ?Patient relates 76 year h/o diabetes. ? ?Patient denies any h/o foot wounds. ? ?Patient denies any numbness, tingling, burning, or pins/needle sensation in feet. ? ?Patient's blood sugar was 252 mg/dl today. Last known HgA1c was 10.5%. ? ?Risk factors:  hypercoagulable state, uncontrolled diabetes, PAD, h/o MI, HTN, CAD, CHF, CKD, hyperlipidemia. ? ?PCP is [name], MD , and last visit was November 25, 2021. ? ?Past Medical History:  ?Diagnosis Date  ? Acute renal failure (ARF) ([place]) 09/24/2015  ? Atrial premature complexes   ? CAD (coronary artery disease) of artery bypass graft   ? Early occlusion of saphenous vein graft to intermediate and marginal branch in February 2007 following bypass grafting   ? CAD (coronary artery disease), native coronary artery 2017  ? hx NSTEMI 09-24-2015  s/p  CABG x5 on 10-02-2015;  post op STEMI inferolateral wall,  SVG OM1 and SVG OM2 occluded, distal OM occlusion the calpruit, treated medically // Myoview 7/21: no ischemia, EF 65, low risk  ? CHF (congestive heart failure) ([place])   ? CKD (chronic kidney disease), stage III ([place])   ? Dyspnea   ? Elevated troponin   ? Erectile dysfunction   ? Esophageal reflux   ? History of atrial fibrillation   ? post op CABG 10-02-2015  ? History of kidney stones   ? History of non-ST elevation myocardial infarction (NSTEMI) 09/24/2015  ? s/p  CABG x5  ? History of ST elevation myocardial infarction (STEMI) 10/22/2015  ? inferior wall,  post op CABG 10-02-2015  ? Hyperlipidemia   ? Hypertension   ? Left ureteral stone   ? Mild atherosclerosis of both carotid arteries   ? Nephrolithiasis   ? per CT bilateral non-obstructive calculi  ? OSA (obstructive sleep apnea)   ? Peripheral artery disease   ? LE Arterial US 01/2019: R PTA and ATA occluded; L ATA occluded  ? RBBB (right  bundle branch block)   ? Renal atrophy, right   ? Sleep apnea   ? wears cpap   ? ST elevation myocardial infarction (STEMI) of inferior wall ([place]) 10/22/2015  ? Type 2 diabetes mellitus treated with insulin ([place])   ? followed by pcp  ? 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 Wears glasses   ? ? ?Patient Active Problem List  ? Diagnosis Date Noted  ? Constipation 11/13/2021  ? 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 Sleep disturbance 11/13/2021  ? Severe major depression, single episode, without psychotic features ([place]) 11/13/2021  ? Recurrent falls 11/13/2021  ? Pure hypercholesterolemia 11/13/2021  ? Long term (current) use of insulin ([place]) 11/13/2021  ? Acute stress disorder 11/13/2021  ? Amaurosis fugax 11/13/2021  ? Amnesia 11/13/2021  ? Anxiety 11/13/2021  ? Cardiac arrhythmia 11/13/2021  ? Congestive heart failure ([place]) 11/13/2021  ? Diabetic renal disease ([place]) 11/13/2021  ? Hearing loss 11/13/2021  ? Hypercoagulable state ([place]) 11/13/2021  ? Hyperglycemia due to type 2 diabetes mellitus ([place]) 11/13/2021  ? Hyperparathyroidism due to renal insufficiency ([place]) 11/13/2021  ? Hypoglycemia 11/13/2021  ? Mild aortic stenosis 11/05/2021  ? Paroxysmal atrial fibrillation ([place]) 11/05/2021  ? Nasal septal deviation 08/30/2021  ? Anticoagulated by anticoagulation treatment 07/16/2021  ? Syncope 05/10/2021  ?  Dyspnea 08/06/2020  ? Gastro-esophageal reflux disease without esophagitis 07/25/2020  ? Globus pharyngeus 07/25/2020  ? Hoarseness 07/25/2020  ? Contusion of right knee 03/16/2020  ? 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 ([place]) 12/21/2019  ? 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 Chronic heart failure with preserved ejection fraction (HFpEF) ([place]) 11/18/2019  ? OSA on CPAP 03/07/2019  ?  Peripheral artery disease   ? CKD (chronic kidney disease) stage 4, GFR 15-29 ml/min ([name]) 11/29/2018  ? Hypersomnia with sleep apnea 11/29/2018  ? Snoring 11/29/2018  ? Intractable vascular headache 11/11/2018  ? 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  ? Coronary artery disease involving native coronary artery of native heart without angina pectoris 09/24/2015  ? Acute renal failure (ARF) ([place]) 09/24/2015  ? Moderate nonproliferative diabetic retinopathy of left eye with macular edema associated with type 2 diabetes mellitus ([place]) 03/01/2008  ? Hyperlipidemia LDL goal <70   ? Essential hypertension   ? History of kidney stones   ? ? ?Past Surgical History:  ?Procedure Laterality Date  ? APPENDECTOMY  1965  ? CARDIAC CATHETERIZATION N/A 09/26/2015  ? Procedure: Left Heart Cath and Coronary Angiography;  Surgeon: Troy Sine, MD;  Location: [facility];  Service: Cardiovascular;  Laterality: N/A;  ? CARDIAC CATHETERIZATION N/A 10/22/2015  ? Procedure: Left Heart Cath and Coronary Angiography;  Surgeon: Sherren Mocha, MD;  Location: [facility];  Service: Cardiovascular;  Laterality: N/A;  ? CATARACT EXTRACTION W/ INTRAOCULAR LENS  IMPLANT, BILATERAL  2017  ? COLONOSCOPY    ? CORONARY ARTERY BYPASS GRAFT N/A 10/02/2015  ? Procedure: CORONARY ARTERY BYPASS GRAFTING (CABG) X5 LIMA-LAD; SVG-DIAG; SVG-OM; SVG-PD; SVG-RAMUS TRANSESOPHAGEAL ECHOCARDIOGRAM (TEE) ENDOSCOPIC GREATER SAPHENOUS VEIN  HARVEST BILAT LE;  Surgeon: Ivin Poot, MD;  Location: [facility];  Service: Open Heart Surgery;  Laterality: N/A;  ? CYSTOSCOPY/URETEROSCOPY/HOLMIUM LASER/STENT PLACEMENT Left 08/10/2018  ? Procedure: CYSTOSCOPY/URETEROSCOPY/HOLMIUM LASER/STENT PLACEMENT;  Surgeon: Festus Aloe, MD;  Location[facility];  Service: Urology;  Laterality: Left;  ? CYSTOSCOPY/URETEROSCOPY/HOLMIUM LASER/STENT PLACEMENT Left 09/10/2018  ? Procedure: CYSTOSCOPY/URETEROSCOPY/HOLMIUM LASER/STENT EXCHANGE;  Surgeon: Festus Aloe,  MD;  Location: [facility];  Service: Urology;  Laterality: Left;  ? LEFT HEART CATHETERIZATION WITH CORONARY ANGIOGRAM N/A 04/13/2014  ? Procedure: LEFT HEART CATHETERIZATION WITH CORONARY ANGIOGRAM;  Surgeon: Jacolyn Reedy, MD;  Location: [facility];  Service: Cardiovascular;  Laterality: N/A;  ? LEG SURGERY Right age 26  ? closed reduction leg fracture  ? NASAL SEPTOPLASTY W/ TURBINOPLASTY Bilateral 08/30/2021  ? Procedure: NASAL SEPTOPLASTY WITH BILATERAL INFERIOR TURBINATE REDUCTION;  Surgeon: Jerrell Belfast, MD;  Location: [facility];  Service: ENT;  Laterality: Bilateral;  ? POLYPECTOMY    ? RIGHT HEART CATH N/A 06/06/2021  ? Procedure: RIGHT HEART CATH;  Surgeon: Jolaine Artist, MD;  Location: [facility];  Service: Cardiovascular;  Laterality: N/A;  ? TEE WITHOUT CARDIOVERSION N/A 10/02/2015  ? Procedure: TRANSESOPHAGEAL ECHOCARDIOGRAM (TEE);  Surgeon: Ivin Poot, MD;  Location: [facility];  Service: Open Heart Surgery;  Laterality: N/A;  ? URETEROSCOPY WITH HOLMIUM LASER LITHOTRIPSY Bilateral 2004;  2005  dr grapey  '@[facility]'$   ? VASECTOMY    ? ? ?Current Outpatient Medications on File Prior to Visit  ?Medication Sig Dispense Refill  ? acetaminophen (TYLENOL) 325 MG tablet Take 325 mg by mouth every 6 (six) hours as needed for headache (pain).    ? ALPRAZolam (XANAX) 0.25 MG tablet Take for anxiety 1  hour before PFT 1 tablet 0  ? atorvastatin (LIPITOR) 80 MG tablet Take 1 tablet (80 mg total) by mouth daily. 90 tablet 3  ? B-D UF III MINI PEN NEEDLES 31G X 5 MM MISC USE AS DIRECTED WITH LANTUS SOLOSTAR    ? Continuous Blood Gluc Sensor (FREESTYLE LIBRE 2 SENSOR) MISC replace sensor every TWO WEEKS    ? DULoxetine (CYMBALTA) 30 MG capsule Take 30 mg by mouth every morning. Take with a 60 mg capsule for a total morning dose of 90 mg    ? DULoxetine (CYMBALTA) 60 MG capsule Take 60 mg by mouth every morning. Take with a 30 mg capsule for a total morning dose of 90 mg  4  ? ELIQUIS 5 MG TABS tablet TAKE ONE  TABLET BY MOUTH EVERY MORNING and TAKE ONE TABLET BY MOUTH EVERY EVENING 180 tablet 1  ? furosemide (LASIX) 20 MG tablet Take 20 mg by mouth daily.    ? HYDROcodone-acetaminophen (NORCO/VICODIN) 5-325 MG tablet Ta

## 2021-12-17 ENCOUNTER — Ambulatory Visit (AMBULATORY_SURGERY_CENTER): Payer: Medicare Other | Admitting: Internal Medicine

## 2021-12-17 ENCOUNTER — Encounter: Payer: Self-pay | Admitting: Internal Medicine

## 2021-12-17 VITALS — BP 108/51 | HR 62 | Temp 98.4°F | Resp 12 | Ht 64.0 in | Wt 190.0 lb

## 2021-12-17 DIAGNOSIS — R1310 Dysphagia, unspecified: Secondary | ICD-10-CM

## 2021-12-17 DIAGNOSIS — Z7901 Long term (current) use of anticoagulants: Secondary | ICD-10-CM

## 2021-12-17 DIAGNOSIS — K449 Diaphragmatic hernia without obstruction or gangrene: Secondary | ICD-10-CM | POA: Diagnosis not present

## 2021-12-17 DIAGNOSIS — K222 Esophageal obstruction: Secondary | ICD-10-CM | POA: Diagnosis not present

## 2021-12-17 DIAGNOSIS — K219 Gastro-esophageal reflux disease without esophagitis: Secondary | ICD-10-CM

## 2021-12-17 MED ORDER — SODIUM CHLORIDE 0.9 % IV SOLN: 500.0000 mL | Freq: Once | INTRAVENOUS | Status: DC

## 2021-12-17 NOTE — Patient Instructions (Signed)
?RESUME ELIQUIS TODAY.  ? ?POST DILITATION DIET-SEE HANDOUT.  ? ? ? ?YOU HAD AN ENDOSCOPIC PROCEDURE TODAY AT [facility]:   Refer to the procedure report that was given to you for any specific questions about what was found during the examination.  If the procedure report does not answer your questions, please call your gastroenterologist to clarify.  If you requested that your care partner not be given the details of your procedure findings, then the procedure report has been included in a sealed envelope for you to review at your convenience later. ? ?YOU SHOULD EXPECT: Some feelings of bloating in the abdomen. Passage of more gas than usual.  Walking can help get rid of the air that was put into your GI tract during the procedure and reduce the bloating. If you had a lower endoscopy (such as a colonoscopy or flexible sigmoidoscopy) you may notice spotting of blood in your stool or on the toilet paper. If you underwent a bowel prep for your procedure, you may not have a normal bowel movement for a few days. ? ?Please Note:  You might notice some irritation and congestion in your nose or some drainage.  This is from the oxygen used during your procedure.  There is no need for concern and it should clear up in a day or so. ? ?SYMPTOMS TO REPORT IMMEDIATELY: ? ? ?Following upper endoscopy (EGD) ? Vomiting of blood or coffee ground material ? New chest pain or pain under the shoulder blades ? Painful or persistently difficult swallowing ? New shortness of breath ? Fever of 100?F or higher ? Black, tarry-looking stools ? ?For urgent or emergent issues, a gastroenterologist can be reached at any hour by calling [phone]. ?Do not use MyChart messaging for urgent concerns.  ? ? ?DIET:  We do recommend a small meal at first, but then you may proceed to your regular diet.  Drink plenty of fluids but you should avoid alcoholic beverages for 24 hours. ? ?ACTIVITY:  You should plan to take it easy for  the rest of today and you should NOT DRIVE or use heavy machinery until tomorrow (because of the sedation medicines used during the test).   ? ?FOLLOW UP: ?Our staff will call the number listed on your records 48-72 hours following your procedure to check on you and address any questions or concerns that you may have regarding the information given to you following your procedure. If we do not reach you, we will leave a message.  We will attempt to reach you two times.  During this call, we will ask if you have developed any symptoms of COVID 19. If you develop any symptoms (ie: fever, flu-like symptoms, shortness of breath, cough etc.) before then, please call [phone].  If you test positive for Covid 19 in the 2 weeks post procedure, please call and report this information to [place].   ? ?If any biopsies were taken you will be contacted by phone or by letter within the next 1-3 weeks.  Please call us at [phone] if you have not heard about the biopsies in 3 weeks.  ? ? ?SIGNATURES/CONFIDENTIALITY: ?You and/or your care partner have signed paperwork which will be entered into your electronic medical record.  These signatures attest to the fact that that the information above on your After Visit Summary has been reviewed and is understood.  Full responsibility of the confidentiality of this discharge information lies with you and/or your care-partner.  ?

## 2021-12-17 NOTE — Op Note (Signed)
[place] ?Patient Name: Fernando Hess ?Procedure Date: 12/17/2021 2:19 PM ?MRN: [id number] ?Endoscopist: Docia Chuck. Henrene Pastor , MD ?Age: 76 ?Referring MD:  ?Date of Birth: 11/11/1945 ?Gender: Male ?Account #: [id number] ?Procedure:                Upper GI endoscopy with Maloney dilation of the  ?                          esophagus. [address] [place] ?Indications:              Dysphagia, Therapeutic procedure ?Medicines:                Monitored Anesthesia Care ?Procedure:                Pre-Anesthesia Assessment: ?                          - Prior to the procedure, a History and Physical  ?                          was performed, and patient medications and  ?                          allergies were reviewed. The patient's tolerance of  ?                          previous anesthesia was also reviewed. The risks  ?                          and benefits of the procedure and the sedation  ?                          options and risks were discussed with the patient.  ?                          All questions were answered, and informed consent  ?                          was obtained. Prior Anticoagulants: The patient has  ?                          taken Eliquis (apixaban), last dose was 4 days  ?                          prior to procedure. ASA Grade Assessment: III - A  ?                          patient with severe systemic disease. After  ?                          reviewing the risks and benefits, the patient was  ?                          deemed in satisfactory condition to undergo the  ?  procedure. ?                          After obtaining informed consent, the endoscope was  ?                          passed under direct vision. Throughout the  ?                          procedure, the patient's blood pressure, pulse, and  ?                          oxygen saturations were monitored continuously. The  ?                          GIF [id number] #[id number] was introduced through the  ?                           mouth, and advanced to the second part of duodenum.  ?                          The upper GI endoscopy was accomplished without  ?                          difficulty. The patient tolerated the procedure  ?                          well. ?Scope In: ?Scope Out: ?Findings:                 One benign-appearing, intrinsic mild stenosis was  ?                          found at the gastroesophageal junction. 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  ?                          After the endoscopic survey was completed, The  ?                          scope was withdrawn. Dilation was performed with a  ?                          Maloney dilator with no resistance at 52 Fr. ?                          The exam of the esophagus was otherwise normal. ?                          The stomach was normal save small hiatal hernia. ?                          The examined duodenum was normal. ?  The cardia and gastric fundus were normal on  ?                          retroflexion. ?Complications:            No immediate complications. ?Estimated Blood Loss:     Estimated blood loss: none. ?Impression:               - Benign-appearing esophageal stenosis. Dilated. ?                          - Normal stomach. Small hiatal hernia. ?                          - Normal examined duodenum. ?                          - No specimens collected. ?Recommendation:           - Patient has a contact number available for  ?                          emergencies. The signs and symptoms of potential  ?                          delayed complications were discussed with the  ?                          patient. Return to normal activities tomorrow.  ?                          Written discharge instructions were provided to the  ?                          patient. ?                          - Post dilation diet. ?                          - Continue present medications. ?                           -Resume Eliquis today ?Docia Chuck. Henrene Pastor, MD ?12/17/2021 2:54:06 PM ?This report has been signed electronically. ?

## 2021-12-17 NOTE — Progress Notes (Signed)
Report to PACU, RN, vss, BBS= Clear.  

## 2021-12-17 NOTE — Progress Notes (Signed)
Pt's states no medical or surgical changes since previsit or office visit. 

## 2021-12-17 NOTE — Progress Notes (Signed)
11/15/2021 ?[name] ?[id number] ?1945-11-28 ?  ?  ?Chief Complaint: Difficulty swallowing  ?  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  ?  ?He presents to our office today for  further evaluation regarding dysphagia. He is accompanied by his wife. He complains of having food which gets stuck in his throat/upper esophagus which occurs several days weekly for the past 6 months. He typically drinks water and the stuck food passes down the esophagus. He sometimes has difficulty swallowing liquids, coughs after he drinks water. Infrequent heartburn. He takes Pantoprazole '40mg'$  QD. He underwent a  barium swallow 07/18/2020 which showed a small hiatal hernia and a nonrestrictive distal esophageal ring. He underwent an EGD 05/31/2019 by Dr. 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 He has daily SOB since 07/2021. He takes shallow frequent breaths with minimal exertion. No associated CP or dizziness. He saw [name] cardiology [name] 11/06/2021 and due to his SOB he was scheduled for a cardiopulmonary exercise test 11/20/2021. Myoview 08/16/2021 without ischemia or infarction, EF 55 %, low risk. ECHO 04/2021 showed LV EF 60 - 65%. ?  ?CBC Latest Ref Rng & Units 08/27/2021 06/06/2021 06/06/2021  ?WBC 4.0 - 10.5 K/uL 11.9(H) - -  ?Hemoglobin 13.0 - 17.0 g/dL 14.5 15.3 14.6  ?Hematocrit 39.0 - 52.0 % 44.3 45.0 43.0  ?Platelets 150 - 400 K/uL 247 - -  ?  ?CMP Latest Ref Rng & Units 11/06/2021 08/27/2021 06/06/2021  ?Glucose 70 - 99 mg/dL 314(H) 127(H) -  ?BUN 8 - 27 mg/dL 34(H) 23 -  ?Creatinine 0.76 - 1.27 mg/dL 1.99(H) 1.91(H) -  ?Sodium 134 - 144 mmol/L 138 138 138  ?Potassium 3.5 - 5.2 mmol/L 5.0 3.8 4.6  ?Chloride 96 - 106 mmol/L 100 104 -  ?CO2  20 - 29 mmol/L 24 26 -  ?Calcium 8.6 - 10.2 mg/dL 9.1 8.9 -  ?Total Protein 6.0 - 8.5 g/dL 6.7 - -  ?Total Bilirubin 0.0 - 1.2 mg/dL 0.7 - -  ?Alkaline Phos 44 - 121 IU/L 173(H) - -  ?AST 0 - 40 IU/L 19 - -  ?ALT 0 - 44 IU/L 20 - -  ?  ?Barium Swallow 07/18/2020: ? 1. Small reducible hiatal hernia with associated non restrictive ?distal esophageal ring. ?2. No observed gastroesophageal reflux or focal mucosal abnormality. ?3. Barium tablet passed without delay into the stomach. ?  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 ?- Gastroduodenitis. Biopsied. ?- Normal examined otherwise. ?  ?Colonoscopy 02/21/2019: ?Three 2 to 3 mm polyps in the ascending colon, removed with a cold snare. Resected and ?retrieved. ?- Diverticulosis in the left colon. ?- The examination was otherwise normal on direct and retroflexion views. ?- Recall colonoscopy 5 years ?- TUBULAR ADENOMA (1 OF 6 FRAGMENTS) ?- BENIGN COLONIC MUCOSA (5 OF 6 FRAGMENTS) ?- NO HIGH GRADE DYSPLASIA OR MALIGNANCY IDENTIFIED ?  ?ECHO 05/10/2021: ?IMPRESSIONS ?Left ventricular ejection fraction, by estimation, is 60 to 65%. The left ventricle has ?normal function. Left ventricular endocardial border not optimally defined to evaluate ?regional wall motion. There is mild left ventricular hypertrophy. Left ventricular ?diastolic parameters are consistent with Grade I diastolic dysfunction (impaired ?relaxation). ?1. ?Right ventricular systolic  function is mildly reduced. The right ventricular size is ?mildly enlarged. Tricuspid regurgitation signal is inadequate for assessing PA ?pressure. ?2. ?3. Left atrial size was mildly dilated. ?The mitral valve is degenerative. Trivial mitral valve regurgitation. No evidence of ?mitral stenosis. ?4. ?The aortic valve is abnormal. There is moderate calcification of the aortic valve. There ?is moderate thickening of the aortic valve. Aortic valve regurgitation is trivial. Mild ?aortic valve stenosis.  Aortic valve area, by VTI measures 1.81 cm??. Aortic valve mean ?gradient measures 11.7 mmHg. Aortic valve Vmax measures 2.50 m/s. ?5. ?The inferior vena cava is normal in size with greater than 50% respiratory variability, ?suggesting right atrial pressure of 3 mmHg. ?  ?      ?Current Outpatient Medications on File Prior to Visit  ?Medication Sig Dispense Refill  ? acetaminophen (TYLENOL) 325 MG tablet Take 325 mg by mouth every 6 (six) hours as needed for headache (pain).      ? ALPRAZolam (XANAX) 0.25 MG tablet Take for anxiety 1 hour before PFT 1 tablet 0  ? atorvastatin (LIPITOR) 80 MG tablet Take 1 tablet (80 mg total) by mouth daily. 90 tablet 3  ? B-D UF III MINI PEN NEEDLES 31G X 5 MM MISC USE AS DIRECTED WITH LANTUS SOLOSTAR      ? DULoxetine (CYMBALTA) 30 MG capsule Take 30 mg by mouth every morning. Take with a 60 mg capsule for a total morning dose of 90 mg      ? DULoxetine (CYMBALTA) 60 MG capsule Take 60 mg by mouth every morning. Take with a 30 mg capsule for a total morning dose of 90 mg   4  ? ELIQUIS 5 MG TABS tablet TAKE ONE TABLET BY MOUTH EVERY MORNING and TAKE ONE TABLET BY MOUTH EVERY EVENING 180 tablet 1  ? furosemide (LASIX) 20 MG tablet Take 20 mg by mouth daily.      ? hydrOXYzine (VISTARIL) 25 MG capsule Take 25 mg by mouth at bedtime as needed for itching (sleep).      ? Insulin Glargine (BASAGLAR KWIKPEN) 100 UNIT/ML SOPN Inject 40 Units into the skin daily.      ? insulin lispro (HUMALOG) 100 UNIT/ML KwikPen Inject 14 Units into the skin 3 (three) times daily.      ? MAGNESIUM PO Take 350 mg by mouth every evening.      ? Melatonin 3 MG TABS Take 3 mg by mouth at bedtime.      ? Multiple Vitamin (MULTIVITAMIN WITH MINERALS) TABS tablet Take 1 tablet by mouth every morning.      ? ondansetron (ZOFRAN-ODT) 4 MG disintegrating tablet Take 4 mg by mouth every 8 (eight) hours as needed for nausea or vomiting.      ? pantoprazole (PROTONIX) 40 MG tablet TAKE 1 TABLET BY MOUTH EVERY DAY 90  tablet 2  ? zaleplon ([name]) 5 MG capsule Take 1 capsule (5 mg total) by mouth at bedtime. 30 capsule 5  ? nitroGLYCERIN (NITROSTAT) 0.4 MG SL tablet Place 0.4 mg under the tongue every 5 (five) minutes as needed for chest pain. (Patient not taking: Reported on 11/15/2021)      ?  ?         ?Current Facility-Administered Medications on File Prior to Visit  ?Medication Dose Route Frequency Provider Last Rate Last Admin  ? sodium chloride flush (NS) 0.9 % injection 3 mL  3 mL Intravenous Q12H Chandrasekhar, Mahesh A, MD      ?  ?     ?  Allergies  ?Allergen Reactions  ? Cilostazol Swelling  ?    edema ?Other reaction(s): edema  ? Dulaglutide Nausea And Vomiting  ?    Other reaction(s): vomiting  ? Levofloxacin Itching and Rash  ?    Other reaction(s): hives  ? Liraglutide Other (See Comments)  ?    Severe fatigue & insomnia ?Other reaction(s): severe fatigue and insomnia  ? Lisinopril Itching and Rash  ?    Other reaction(s): cough  ?  ?Current Medications, Allergies, Past Medical History, Past Surgical History, Family History and Social History were reviewed in [employer] record. ?  ?  ?Review of Systems:   ?Constitutional: Negative for fever, sweats, chills or weight loss.  ?Respiratory: + SOB. Occasional nonproductive cough. No hemoptysis.  ?Cardiovascular: Negative for chest pain, palpitations and leg swelling.  ?Gastrointestinal: See HPI.  ?Musculoskeletal: Negative for back pain or muscle aches.  ?Neurological: Negative for dizziness, headaches or paresthesias.  ?  ?  ?Physical Exam: ?BP (!) 110/58 (BP Location: Left Arm, Patient Position: Sitting, Cuff Size: Normal)   Pulse 84 Comment: irregular  Ht '5\' 4"'$  (1.626 m) Comment: height measured without shoes  Wt 190 lb 4 oz (86.3 kg)   BMI 32.66 kg/m?  ?General: 76 year old male in NAD. ?Head: Normocephalic and atraumatic. ?Eyes: No scleral icterus. Conjunctiva pink . ?Ears: Normal auditory acuity. ?Mouth: Dentition intact. No ulcers or  lesions.  ?Lungs: Clear throughout to auscultation. ?Heart: Regular rate and rhythm, no murmur. ?Chest: Mediastinal scar intact.  ?Abdomen: Soft, nontender and nondistended. No masses or hepatomegaly. Normal bowe

## 2021-12-18 ENCOUNTER — Encounter: Payer: Self-pay | Admitting: Podiatry

## 2021-12-19 ENCOUNTER — Telehealth: Payer: Self-pay

## 2021-12-19 NOTE — Telephone Encounter (Signed)
Left message on follow up call. 

## 2021-12-19 NOTE — Telephone Encounter (Signed)
?  Follow up Call- ? ? ?  12/17/2021  ?  1:02 PM 05/31/2019  ?  9:10 AM  ?Call back number  ?Post procedure Call Back phone  # [phone]  ?Permission to leave phone message Yes Yes  ?  ? ?Second post op call attempted, no answer,unable to leave WM ? ? ?

## 2022-01-21 NOTE — Progress Notes (Deleted)
?Cardiology Office Note:   ? ?Date:  01/21/2022  ? ?ID:  Fernando Hess, DOB 09/03/1946, MRN [id number] ? ?PCP:  [name], MD ?  ?[facility] Providers ?Cardiologist:  Freada Bergeron, MD ?Cardiology APP:  Liliane Shi, [name]  ?Electrophysiologist:  Vickie Epley, MD  ?{ ? ? ?Referring MD: [name], MD  ? ? ?History of Present Illness:   ? ?Fernando Hess is a 76 y.o. male with a hx of CAD s/p CABG in 2017, HFpEF, pAfib on apixaban, mild AS, DMII, HTN, HLD, CKD IV, OSA, and PAD who was previously followed by Dr. Meda Coffee who now presents to clinic for follow-up.  ? ?Per review of the record, the patient suffered a myocardial infarction in January 2017 and underwent multivessel CABG.  Postoperative course was complicated by atrial fibrillation treated with amiodarone.  He presented back to the hospital in 10/2015 with an inferolateral ST elevation myocardial infarction.  He had significant hyperkalemia and nausea and vomiting. Amiodarone was stopped secondary to nausea and vomiting.  Cardiac catheterization demonstrated an occluded SVG-OM1 and occluded SVG-OM2.  His distal OM was occluded.  The distal OM occlusion was felt to be the culprit for his myocardial infarction.  He had diabetic appearing vessels and medical therapy was recommended.  ? ?The patient has been struggling with episode of falls as well as shortness of breath.  With regards to shortness of breath he tries to walk a mile every day and has to stop several times. He underwent nuclear stress test 03/2020  that showed no evidence of prior infarct or ischemia. ?  ?With regards to dizziness he says that it has improved and he has not had any falls since he started to use a cane.  He saw Dr. 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 [name] recommended a loop monitor however patient was not interested at that time. ? ?He was admitted 05/10/21-05/12/21 with episodes of syncope and  shortness of breath.  TTE demonstrated normal EF.  His BNP was mildly elevated and he was diuresed with IV furosemide.  PRN furosemide was recommended at DC.  His beta-blocker was DC'd.  He had orthostatic hypotension noted.  He saw Dr. [name] after DC and was set up for RHC due to ongoing shortness of breath and weakness.  RHC showed low filling pressures.  He was asked to reduce furosemide use (he had been taking it daily prior to [place]) to 3 times a week.  An OP event monitor showed frequent asymptomatic PACs and short runs of Supraventricular Tachycardia (no AF or pauses). ? ?Last saw [name] on 06/26/21 where he continued to have SOB and was being seen by Pulmonary. Appeared compensated from volume standpoint. BNP was elevated at 627. ? ?Was seen as pre-op visit prior to ENT surgery in 07/2021 where myoview 08/16/21 was normal with EF 55%.  ? ?Last seen in clinic by [name] on 10/2020 where he continued to have SOB. He underwent CPET which was limited due to submaximal effort during exercise, however, based on available data, exercise testing with gas exchange demonstrated normal functional capacity. No clear cardiopulm abnormality.  ? ?Past Medical History:  ?Diagnosis Date  ? Acute renal failure (ARF) ([place]) 09/24/2015  ? Atrial premature complexes   ? CAD (coronary artery disease) of artery bypass graft   ? Early occlusion of saphenous vein graft to intermediate and marginal branch in February 2007 following bypass grafting   ?  CAD (coronary artery disease), native coronary artery 2017  ? hx NSTEMI 09-24-2015  s/p  CABG x5 on 10-02-2015;  post op STEMI inferolateral wall,  SVG OM1 and SVG OM2 occluded, distal OM occlusion the calpruit, treated medically // Myoview 7/21: no ischemia, EF 65, low risk  ? CHF (congestive heart failure) ([place])   ? CKD (chronic kidney disease), stage III ([place])   ? Dyspnea   ? Elevated troponin   ? Erectile dysfunction   ? Esophageal reflux   ? History of atrial  fibrillation   ? post op CABG 10-02-2015  ? History of kidney stones   ? History of non-ST elevation myocardial infarction (NSTEMI) 09/24/2015  ? s/p  CABG x5  ? History of ST elevation myocardial infarction (STEMI) 10/22/2015  ? inferior wall,  post op CABG 10-02-2015  ? Hyperlipidemia   ? Hypertension   ? Left ureteral stone   ? Mild atherosclerosis of both carotid arteries   ? Nephrolithiasis   ? per CT bilateral non-obstructive calculi  ? OSA (obstructive sleep apnea)   ? Peripheral artery disease   ? LE Arterial US 01/2019: R PTA and ATA occluded; L ATA occluded  ? RBBB (right bundle branch block)   ? Renal atrophy, right   ? Sleep apnea   ? wears cpap   ? ST elevation myocardial infarction (STEMI) of inferior wall ([place]) 10/22/2015  ? Type 2 diabetes mellitus treated with insulin ([place])   ? followed by pcp  ? 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 Wears glasses   ? ? ?Past Surgical History:  ?Procedure Laterality Date  ? APPENDECTOMY  1965  ? CARDIAC CATHETERIZATION N/A 09/26/2015  ? Procedure: Left Heart Cath and Coronary Angiography;  Surgeon: Troy Sine, MD;  Location: [facility];  Service: Cardiovascular;  Laterality: N/A;  ? CARDIAC CATHETERIZATION N/A 10/22/2015  ? Procedure: Left Heart Cath and Coronary Angiography;  Surgeon: Sherren Mocha, MD;  Location: [facility];  Service: Cardiovascular;  Laterality: N/A;  ? CATARACT EXTRACTION W/ INTRAOCULAR LENS  IMPLANT, BILATERAL  2017  ? COLONOSCOPY    ? CORONARY ARTERY BYPASS GRAFT N/A 10/02/2015  ? Procedure: CORONARY ARTERY BYPASS GRAFTING (CABG) X5 LIMA-LAD; SVG-DIAG; SVG-OM; SVG-PD; SVG-RAMUS TRANSESOPHAGEAL ECHOCARDIOGRAM (TEE) ENDOSCOPIC GREATER SAPHENOUS VEIN  HARVEST BILAT LE;  Surgeon: Ivin Poot, MD;  Location: [facility];  Service: Open Heart Surgery;  Laterality: N/A;  ? CYSTOSCOPY/URETEROSCOPY/HOLMIUM LASER/STENT PLACEMENT Left 08/10/2018  ?  Procedure: CYSTOSCOPY/URETEROSCOPY/HOLMIUM LASER/STENT PLACEMENT;  Surgeon: Festus Aloe, MD;  Location: [facility];  Service: Urology;  Laterality: Left;  ? CYSTOSCOPY/URETEROSCOPY/HOLMIUM LASER/STENT PLACEMENT Left 09/10/2018  ? Procedure: CYSTOSCOPY/URETEROSCOPY/HOLMIUM LASER/STENT EXCHANGE;  Surgeon: Festus Aloe, MD;  Location: [facility];  Service: Urology;  Laterality: Left;  ? LEFT HEART CATHETERIZATION WITH CORONARY ANGIOGRAM N/A 04/13/2014  ? Procedure: LEFT HEART CATHETERIZATION WITH CORONARY ANGIOGRAM;  Surgeon: Jacolyn Reedy, MD;  [facility];  Service: Cardiovascular;  Laterality: N/A;  ? LEG SURGERY Right age 47  ? closed reduction leg fracture  ? NASAL SEPTOPLASTY W/ TURBINOPLASTY Bilateral 08/30/2021  ? Procedure: NASAL SEPTOPLASTY WITH BILATERAL INFERIOR TURBINATE REDUCTION;  Surgeon: Jerrell Belfast, MD;  Location: [facility];  Service: ENT;  Laterality: Bilateral;  ? POLYPECTOMY    ? RIGHT HEART CATH N/A 06/06/2021  ? Procedure: RIGHT HEART CATH;  Surgeon: Jolaine Artist, MD;  Location: [facility];  Service:  Cardiovascular;  Laterality: N/A;  ? TEE WITHOUT CARDIOVERSION N/A 10/02/2015  ? Procedure: TRANSESOPHAGEAL ECHOCARDIOGRAM (TEE);  Surgeon: Ivin Poot, MD;  Location: [facility];  Service: Open Heart Surgery;  Laterality: N/A;  ? URETEROSCOPY WITH HOLMIUM LASER LITHOTRIPSY Bilateral 2004;  2005  dr grapey  '@[facility]'$   ? VASECTOMY    ? ? ?Current Medications: ?No outpatient medications have been marked as taking for the 01/22/22 encounter (Appointment) with Freada Bergeron, MD.  ? ?Current Facility-Administered Medications for the 01/22/22 encounter (Appointment) with Freada Bergeron, MD  ?Medication  ? sodium chloride flush (NS) 0.9 % injection 3 mL  ?  ? ?Allergies:   Cilostazol, Dulaglutide, Levofloxacin, Liraglutide, and Lisinopril  ? ?Social History  ? ?Socioeconomic History  ? Marital status: Married  ?  Spouse name: Not on file  ? Number of  children: Not on file  ? Years of education: Not on file  ? Highest education level: Not on file  ?Occupational History  ? Occupation: retired  ?Tobacco Use  ? Smoking status: Never  ? Smokeless tobacco: Never  ?V

## 2022-01-22 ENCOUNTER — Ambulatory Visit (INDEPENDENT_AMBULATORY_CARE_PROVIDER_SITE_OTHER): Payer: Medicare Other | Admitting: Cardiology

## 2022-01-22 ENCOUNTER — Other Ambulatory Visit: Payer: Medicare Other | Admitting: *Deleted

## 2022-01-22 ENCOUNTER — Encounter: Payer: Self-pay | Admitting: Cardiology

## 2022-01-22 VITALS — BP 120/76 | HR 71 | Ht 64.0 in | Wt 188.6 lb

## 2022-01-22 DIAGNOSIS — I5032 Chronic diastolic (congestive) heart failure: Secondary | ICD-10-CM

## 2022-01-22 DIAGNOSIS — N184 Chronic kidney disease, stage 4 (severe): Secondary | ICD-10-CM

## 2022-01-22 DIAGNOSIS — Z79899 Other long term (current) drug therapy: Secondary | ICD-10-CM

## 2022-01-22 DIAGNOSIS — I10 Essential (primary) hypertension: Secondary | ICD-10-CM

## 2022-01-22 DIAGNOSIS — R0602 Shortness of breath: Secondary | ICD-10-CM

## 2022-01-22 DIAGNOSIS — I2510 Atherosclerotic heart disease of native coronary artery without angina pectoris: Secondary | ICD-10-CM

## 2022-01-22 DIAGNOSIS — E785 Hyperlipidemia, unspecified: Secondary | ICD-10-CM

## 2022-01-22 DIAGNOSIS — I350 Nonrheumatic aortic (valve) stenosis: Secondary | ICD-10-CM

## 2022-01-22 DIAGNOSIS — I480 Paroxysmal atrial fibrillation: Secondary | ICD-10-CM

## 2022-01-22 LAB — LIPID PANEL
Chol/HDL Ratio: 2.9 ratio (ref 0.0–5.0)
Cholesterol, Total: 121 mg/dL (ref 100–199)
HDL: 42 mg/dL (ref >39)
LDL Chol Calc (NIH): 57 mg/dL (ref 0–99)
Triglycerides: 120 mg/dL (ref 0–149)
VLDL Cholesterol Cal: 22 mg/dL (ref 5–40)

## 2022-01-22 LAB — HEPATIC FUNCTION PANEL
ALT: 21 IU/L (ref 0–44)
AST: 22 IU/L (ref 0–40)
Albumin: 4.1 g/dL (ref 3.7–4.7)
Alkaline Phosphatase: 173 IU/L — ABNORMAL HIGH (ref 44–121)
Bilirubin Total: 0.5 mg/dL (ref 0.0–1.2)
Bilirubin, Direct: 0.19 mg/dL (ref 0.00–0.40)
Total Protein: 6.5 g/dL (ref 6.0–8.5)

## 2022-01-22 NOTE — Progress Notes (Signed)
?Cardiology Office Note:   ? ?Date:  01/22/2022  ? ?ID:  Fernando Hess, DOB March 19, 1946, MRN [id number] ? ?PCP:  [name], MD ?  ?[facility] Providers ?Cardiologist:  Freada Bergeron, MD ?Cardiology APP:  Liliane Shi, [name]  ?Electrophysiologist:  Vickie Epley, MD  ?{ ? ?Referring MD: [name], MD  ? ? ?History of Present Illness:   ? ?Fernando Hess is a 76 y.o. male with a hx of CAD s/p CABG in 2017, HFpEF, pAfib on apixaban, mild AS, DMII, HTN, HLD, CKD IV, OSA, and PAD who was previously followed by Dr. Meda Coffee who now presents to clinic for follow-up.  ? ?Per review of the record, the patient suffered a myocardial infarction in January 2017 and underwent multivessel CABG.  Postoperative course was complicated by atrial fibrillation treated with amiodarone.  He presented back to the hospital in 10/2015 with an inferolateral ST elevation myocardial infarction.  He had significant hyperkalemia and nausea and vomiting. Amiodarone was stopped secondary to nausea and vomiting.  Cardiac catheterization demonstrated an occluded SVG-OM1 and occluded SVG-OM2.  His distal OM was occluded.  The distal OM occlusion was felt to be the culprit for his myocardial infarction.  He had diabetic appearing vessels and medical therapy was recommended.  ? ?The patient has been struggling with episode of falls as well as shortness of breath.  With regards to shortness of breath he tries to walk a mile every day and has to stop several times. He underwent nuclear stress test 03/2020  that showed no evidence of prior infarct or ischemia. ?  ?With regards to dizziness he says that it has improved and he has not had any falls since he started to use a cane.  He saw Dr. 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 [name] recommended a loop monitor however patient was not interested at that time. ? ?He was admitted 05/10/21-05/12/21 with episodes of syncope and  shortness of breath.  TTE demonstrated normal EF.  His BNP was mildly elevated and he was diuresed with IV furosemide.  PRN furosemide was recommended at DC.  His beta-blocker was DC'd.  He had orthostatic hypotension noted.  He saw Dr. [name] after DC and was set up for RHC due to ongoing shortness of breath and weakness.  RHC showed low filling pressures.  He was asked to reduce furosemide use (he had been taking it daily prior to [place]) to 3 times a week.  An OP event monitor showed frequent asymptomatic PACs and short runs of Supraventricular Tachycardia (no AF or pauses). ? ?Last saw [name] on 06/26/21 where he continued to have SOB and was being seen by Pulmonary. Appeared compensated from volume standpoint. BNP was elevated at 627. ? ?Was seen as pre-op visit prior to ENT surgery in 07/2021 where myoview 08/16/21 was normal with EF 55%.  ? ?Last seen in clinic by [name] on 10/2020 where he continued to have SOB. He underwent CPET which was limited due to submaximal effort during exercise, however, based on available data, exercise testing with gas exchange demonstrated normal functional capacity. No clear cardiopulm abnormality.  ? ?He is accompanied by his wife Fernando Hess, who is also a patient of mine. They both have appointments scheduled today and wish to be seen together. Today, the patient states that he is still feeling short winded. His wife notes that by the end of the day he is more dyspneic, but  also not as short of breath while sitting down. ? ?His occasional palpitations are ongoing. During the physical exam today he noted feeling a skipped beat. ? ?He has seen a neurologist, and was told that there was weakness in his left hand. Also he notes tingling sensations in his right hand and forearm. He complains of ongoing RLE numbness inferior to his knee, attributable to his prior surgery. He is planned to see Neurology to assess further. ? ?He remains compliant with his  medications. ? ?He denies any chest pain, or peripheral edema. No lightheadedness, headaches, syncope, orthopnea, or PND. ? ? ?Past Medical History:  ?Diagnosis Date  ? Acute renal failure (ARF) ([place]) 09/24/2015  ? Atrial premature complexes   ? CAD (coronary artery disease) of artery bypass graft   ? Early occlusion of saphenous vein graft to intermediate and marginal branch in February 2007 following bypass grafting   ? CAD (coronary artery disease), native coronary artery 2017  ? hx NSTEMI 09-24-2015  s/p  CABG x5 on 10-02-2015;  post op STEMI inferolateral wall,  SVG OM1 and SVG OM2 occluded, distal OM occlusion the calpruit, treated medically // Myoview 7/21: no ischemia, EF 65, low risk  ? CHF (congestive heart failure) ([place])   ? CKD (chronic kidney disease), stage III ([place])   ? Dyspnea   ? Elevated troponin   ? Erectile dysfunction   ? Esophageal reflux   ? History of atrial fibrillation   ? post op CABG 10-02-2015  ? History of kidney stones   ? History of non-ST elevation myocardial infarction (NSTEMI) 09/24/2015  ? s/p  CABG x5  ? History of ST elevation myocardial infarction (STEMI) 10/22/2015  ? inferior wall,  post op CABG 10-02-2015  ? Hyperlipidemia   ? Hypertension   ? Left ureteral stone   ? Mild atherosclerosis of both carotid arteries   ? Nephrolithiasis   ? per CT bilateral non-obstructive calculi  ? OSA (obstructive sleep apnea)   ? Peripheral artery disease   ? LE Arterial US 01/2019: R PTA and ATA occluded; L ATA occluded  ? RBBB (right bundle branch block)   ? Renal atrophy, right   ? Sleep apnea   ? wears cpap   ? ST elevation myocardial infarction (STEMI) of inferior wall ([place]) 10/22/2015  ? Type 2 diabetes mellitus treated with insulin ([place])   ? followed by pcp  ? 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 Wears glasses   ? ? ?Past Surgical History:  ?Procedure Laterality Date  ? APPENDECTOMY  1965  ?  CARDIAC CATHETERIZATION N/A 09/26/2015  ? Procedure: Left Heart Cath and Coronary Angiography;  Surgeon: Troy Sine, MD;  Location: [facility];  Service: Cardiovascular;  Laterality: N/A;  ? CARDIAC CATHETERIZATION N/A 10/22/2015  ? Procedure: Left Heart Cath and Coronary Angiography;  Surgeon: Sherren Mocha, MD;  Location: [facility];  Service: Cardiovascular;  Laterality: N/A;  ? CATARACT EXTRACTION W/ INTRAOCULAR LENS  IMPLANT, BILATERAL  2017  ? COLONOSCOPY    ? CORONARY ARTERY BYPASS GRAFT N/A 10/02/2015  ? Procedure: CORONARY ARTERY BYPASS GRAFTING (CABG) X5 LIMA-LAD; SVG-DIAG; SVG-OM; SVG-PD; SVG-RAMUS TRANSESOPHAGEAL ECHOCARDIOGRAM (TEE) ENDOSCOPIC GREATER SAPHENOUS VEIN  HARVEST BILAT LE;  Surgeon: Ivin Poot, MD;  Location: [facility];  Service: Open Heart Surgery;  Laterality: N/A;  ? CYSTOSCOPY/URETEROSCOPY/HOLMIUM LASER/STENT PLACEMENT Left 08/10/2018  ? Procedure: CYSTOSCOPY/URETEROSCOPY/HOLMIUM LASER/STENT PLACEMENT;  Surgeon: Junious Silk,  Rodman Key, MD;  Location: [facility];  Service: Urology;  Laterality: Left;  ? CYSTOSCOPY/URETEROSCOPY/HOLMIUM LASER/STENT PLACEMENT Left 09/10/2018  ? Procedure: CYSTOSCOPY/URETEROSCOPY/HOLMIUM LASER/STENT EXCHANGE;  Surgeon: Festus Aloe, MD;  Location: [facility];  Service: Urology;  Laterality: Left;  ? LEFT HEART CATHETERIZATION WITH CORONARY ANGIOGRAM N/A 04/13/2014  ? Procedure: LEFT HEART CATHETERIZATION WITH CORONARY ANGIOGRAM;  Surgeon: Jacolyn Reedy, MD;  Location: [facility];  Service: Cardiovascular;  Laterality: N/A;  ? LEG SURGERY Right age 16  ? closed reduction leg fracture  ? NASAL SEPTOPLASTY W/ TURBINOPLASTY Bilateral 08/30/2021  ? Procedure: NASAL SEPTOPLASTY WITH BILATERAL INFERIOR TURBINATE REDUCTION;  Surgeon: Jerrell Belfast, MD;  Location: [facility];  Service: ENT;  Laterality: Bilateral;  ? POLYPECTOMY    ? RIGHT HEART CATH N/A 06/06/2021  ? Procedure: RIGHT HEART CATH;  Surgeon: Jolaine Artist, MD;   Location: [facility];  Service: Cardiovascular;  Laterality: N/A;  ? TEE WITHOUT CARDIOVERSION N/A 10/02/2015  ? Procedure: TRANSESOPHAGEAL ECHOCARDIOGRAM (TEE);  Surgeon: Ivin Poot, MD;  Location: [facility];  S

## 2022-01-22 NOTE — Patient Instructions (Signed)
Medication Instructions:   Your physician recommends that you continue on your current medications as directed. Please refer to the Current Medication list given to you today.  *If you need a refill on your cardiac medications before your next appointment, please call your pharmacy*   Follow-Up: At [facility], you and your health needs are our priority.  As part of our continuing mission to provide you with exceptional heart care, we have created designated Provider Care Teams.  These Care Teams include your primary Cardiologist (physician) and Advanced Practice Providers (APPs -  Physician Assistants and Nurse Practitioners) who all work together to provide you with the care you need, when you need it.  We recommend signing up for the patient portal called "MyChart".  Sign up information is provided on this After Visit Summary.  MyChart is used to connect with patients for Virtual Visits (Telemedicine).  Patients are able to view lab/test results, encounter notes, upcoming appointments, etc.  Non-urgent messages can be sent to your provider as well.   To learn more about what you can do with MyChart, go to [email or web address].    Your next appointment:   6 month(s)  The format for your next appointment:   In Person  Provider:   Heather E Pemberton, MD {   Important Information About Sugar       

## 2022-01-23 ENCOUNTER — Ambulatory Visit (INDEPENDENT_AMBULATORY_CARE_PROVIDER_SITE_OTHER): Payer: Medicare Other | Admitting: Ophthalmology

## 2022-01-23 ENCOUNTER — Encounter (INDEPENDENT_AMBULATORY_CARE_PROVIDER_SITE_OTHER): Payer: Self-pay | Admitting: Ophthalmology

## 2022-01-23 DIAGNOSIS — D3131 Benign neoplasm of right choroid: Secondary | ICD-10-CM | POA: Diagnosis not present

## 2022-01-23 DIAGNOSIS — E113311 Type 2 diabetes mellitus with moderate nonproliferative diabetic retinopathy with macular edema, right eye: Secondary | ICD-10-CM

## 2022-01-23 DIAGNOSIS — H43813 Vitreous degeneration, bilateral: Secondary | ICD-10-CM | POA: Diagnosis not present

## 2022-01-23 DIAGNOSIS — E113312 Type 2 diabetes mellitus with moderate nonproliferative diabetic retinopathy with macular edema, left eye: Secondary | ICD-10-CM | POA: Diagnosis not present

## 2022-01-23 DIAGNOSIS — G4733 Obstructive sleep apnea (adult) (pediatric): Secondary | ICD-10-CM

## 2022-01-23 DIAGNOSIS — Z9989 Dependence on other enabling machines and devices: Secondary | ICD-10-CM

## 2022-01-23 NOTE — Progress Notes (Signed)
? ? ?01/23/2022 ? ?  ? ?CHIEF COMPLAINT ?Patient presents for  ?Chief Complaint  ?Patient presents with  ? Diabetic Retinopathy with Macular Edema  ? ? ? ? ?HISTORY OF PRESENT ILLNESS: ?BREWSTER WOLTERS is a 76 y.o. male who presents to the clinic today for:  ? ?HPI   ?4 mos fu Dilate OU, FP, OCT. ?Patient states vision is stable and unchanged since last visit. Denies any new floaters or FOL. ?LBS: 271 this morning before eating, per patient. ?No hospitalizations or surgeries since the last visit. ?Last edited by Laurin Coder on 01/23/2022  8:13 AM.  ?  ? ? ?Referring physician: ?Melissa Noon, OD ?[address] ?[place],  [place] [address] ? ?HISTORICAL INFORMATION:  ? ?Selected notes from the [facility] ?  ? ?Lab Results  ?Component Value Date  ? HGBA1C 11.0 (H) 08/27/2021  ?  ? ?CURRENT MEDICATIONS: ?No current outpatient medications on file. (Ophthalmic Drugs)  ? ?No current facility-administered medications for this visit. (Ophthalmic Drugs)  ? ?Current Outpatient Medications (Other)  ?Medication Sig  ? acetaminophen (TYLENOL) 325 MG tablet Take 325 mg by mouth every 6 (six) hours as needed for headache (pain).  ? ALPRAZolam (XANAX) 0.25 MG tablet Take for anxiety 1 hour before PFT  ? atorvastatin (LIPITOR) 80 MG tablet Take 1 tablet (80 mg total) by mouth daily.  ? B-D UF III MINI PEN NEEDLES 31G X 5 MM MISC USE AS DIRECTED WITH LANTUS SOLOSTAR  ? Continuous Blood Gluc Sensor (FREESTYLE LIBRE 2 SENSOR) MISC replace sensor every TWO WEEKS  ? DULoxetine (CYMBALTA) 30 MG capsule Take 30 mg by mouth every morning. Take with a 60 mg capsule for a total morning dose of 90 mg  ? DULoxetine (CYMBALTA) 60 MG capsule Take 60 mg by mouth every morning. Take with a 30 mg capsule for a total morning dose of 90 mg  ? ELIQUIS 5 MG TABS tablet TAKE ONE TABLET BY MOUTH EVERY MORNING and TAKE ONE TABLET BY MOUTH EVERY EVENING  ? furosemide (LASIX) 20 MG tablet Take 20 mg by mouth daily.  ? HYDROcodone-acetaminophen  (NORCO/VICODIN) 5-325 MG tablet Take 1 tablet by mouth every 4 (four) hours as needed.  ? hydrOXYzine (VISTARIL) 25 MG capsule Take 25 mg by mouth at bedtime as needed for itching (sleep).  ? Insulin Glargine (BASAGLAR KWIKPEN) 100 UNIT/ML SOPN Inject 40 Units into the skin daily.  ? insulin lispro (HUMALOG) 100 UNIT/ML KwikPen Inject 14 Units into the skin 3 (three) times daily.  ? MAGNESIUM PO Take 350 mg by mouth every evening.  ? Melatonin 3 MG TABS Take 3 mg by mouth at bedtime.  ? Multiple Vitamin (MULTIVITAMIN WITH MINERALS) TABS tablet Take 1 tablet by mouth every morning.  ? nitroGLYCERIN (NITROSTAT) 0.4 MG SL tablet Place 0.4 mg under the tongue every 5 (five) minutes as needed for chest pain.  ? ondansetron (ZOFRAN-ODT) 4 MG disintegrating tablet Take 4 mg by mouth every 8 (eight) hours as needed for nausea or vomiting.  ? pantoprazole (PROTONIX) 40 MG tablet TAKE 1 TABLET BY MOUTH EVERY DAY  ? zaleplon ([name]) 5 MG capsule Take 1 capsule (5 mg total) by mouth at bedtime.  ? ?Current Facility-Administered Medications (Other)  ?Medication Route  ? sodium chloride flush (NS) 0.9 % injection 3 mL Intravenous  ? ? ? ? ?REVIEW OF SYSTEMS: ?ROS   ?Negative for: Constitutional, Gastrointestinal, Neurological, Skin, Genitourinary, Musculoskeletal, HENT, Endocrine, Cardiovascular, Eyes, Respiratory, Psychiatric, Allergic/Imm, Heme/Lymph ?Last edited  by Hurman Horn, MD on 01/23/2022  8:52 AM.  ?  ? ? ? ?ALLERGIES ?Allergies  ?Allergen Reactions  ? Cilostazol Swelling  ?  edema ?Other reaction(s): edema ?Other reaction(s): edema  ? Dulaglutide Nausea And Vomiting  ?  Other reaction(s): vomiting ?Other reaction(s): vomiting  ? Levofloxacin Itching, Rash and Hives  ?  Other reaction(s): hives  ? Liraglutide Other (See Comments)  ?  Severe fatigue & insomnia ?Other reaction(s): severe fatigue and insomnia  ? Lisinopril Itching, Rash and Cough  ?  Other reaction(s): cough  ? ? ?PAST MEDICAL HISTORY ?Past Medical  History:  ?Diagnosis Date  ? Acute renal failure (ARF) ([place]) 09/24/2015  ? Atrial premature complexes   ? CAD (coronary artery disease) of artery bypass graft   ? Early occlusion of saphenous vein graft to intermediate and marginal branch in February 2007 following bypass grafting   ? CAD (coronary artery disease), native coronary artery 2017  ? hx NSTEMI 09-24-2015  s/p  CABG x5 on 10-02-2015;  post op STEMI inferolateral wall,  SVG OM1 and SVG OM2 occluded, distal OM occlusion the calpruit, treated medically // Myoview 7/21: no ischemia, EF 65, low risk  ? CHF (congestive heart failure) ([place])   ? CKD (chronic kidney disease), stage III ([place])   ? Dyspnea   ? Elevated troponin   ? Erectile dysfunction   ? Esophageal reflux   ? History of atrial fibrillation   ? post op CABG 10-02-2015  ? History of kidney stones   ? History of non-ST elevation myocardial infarction (NSTEMI) 09/24/2015  ? s/p  CABG x5  ? History of ST elevation myocardial infarction (STEMI) 10/22/2015  ? inferior wall,  post op CABG 10-02-2015  ? Hyperlipidemia   ? Hypertension   ? Left ureteral stone   ? Mild atherosclerosis of both carotid arteries   ? Nephrolithiasis   ? per CT bilateral non-obstructive calculi  ? OSA (obstructive sleep apnea)   ? Peripheral artery disease   ? LE Arterial US 01/2019: R PTA and ATA occluded; L ATA occluded  ? RBBB (right bundle branch block)   ? Renal atrophy, right   ? Sleep apnea   ? wears cpap   ? ST elevation myocardial infarction (STEMI) of inferior wall ([place]) 10/22/2015  ? Type 2 diabetes mellitus treated with insulin ([place])   ? followed by pcp  ? 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 Wears glasses   ? ?Past Surgical History:  ?Procedure Laterality Date  ? APPENDECTOMY  1965  ? CARDIAC CATHETERIZATION N/A 09/26/2015  ? Procedure: Left Heart Cath and Coronary Angiography;  Surgeon: Troy Sine, MD;  Location: [facility];  Service: Cardiovascular;  Laterality: N/A;  ? CARDIAC CATHETERIZATION N/A 10/22/2015  ? Procedure: Left Heart Cath and Coronary Angiography;  Surgeon: Sherren Mocha, MD;  Location: [facility];  Service: Cardiovascular;  Laterality: N/A;  ? CATARACT EXTRACTION W/ INTRAOCULAR LENS  IMPLANT, BILATERAL  2017  ? COLONOSCOPY    ? CORONARY ARTERY BYPASS GRAFT N/A 10/02/2015  ? Procedure: CORONARY ARTERY BYPASS GRAFTING (CABG) X5 LIMA-LAD; SVG-DIAG; SVG-OM; SVG-PD; SVG-RAMUS TRANSESOPHAGEAL ECHOCARDIOGRAM (TEE) ENDOSCOPIC GREATER SAPHENOUS VEIN  HARVEST BILAT LE;  Surgeon: Ivin Poot, MD;  Location: [facility];  Service: Open Heart Surgery;  Laterality: N/A;  ? CYSTOSCOPY/URETEROSCOPY/HOLMIUM LASER/STENT PLACEMENT Left 08/10/2018  ? Procedure: CYSTOSCOPY/URETEROSCOPY/HOLMIUM LASER/STENT PLACEMENT;  Surgeon: Festus Aloe, MD;  Location: [facility];  Service: Urology;  Laterality: Left;  ? CYSTOSCOPY/URETEROSCOPY/HOLMIUM LASER/STENT PLACEMENT Left 09/10/2018  ? Procedure: CYSTOSCOPY/URETEROSCOPY/HOLMIUM LASER/STENT EXCHANGE;  Surgeon: Festus Aloe, MD;  Location: [facility];  Service: Urology;  Laterality: Left;  ? LEFT HEART CATHETERIZATION WITH CORONARY ANGIOGRAM N/A 04/13/2014  ? Procedure: LEFT HEART CATHETERIZATION WITH CORONARY ANGIOGRAM;  Surgeon: Jacolyn Reedy, MD;  Location: [facility];  Service: Cardiovascular;  Laterality: N/A;  ? LEG SURGERY Right age 46  ? closed reduction leg fracture  ? NASAL SEPTOPLASTY W/ TURBINOPLASTY Bilateral 08/30/2021  ? Procedure: NASAL SEPTOPLASTY WITH BILATERAL INFERIOR TURBINATE REDUCTION;  Surgeon: Jerrell Belfast, MD;  Location: [facility];  Service: ENT;  Laterality: Bilateral;  ? POLYPECTOMY    ? RIGHT HEART CATH N/A 06/06/2021  ? Procedure: RIGHT HEART CATH;  Surgeon: Jolaine Artist, MD;  Location: [facility];  Service: Cardiovascular;  Laterality: N/A;  ? TEE WITHOUT CARDIOVERSION N/A 10/02/2015  ? Procedure:  TRANSESOPHAGEAL ECHOCARDIOGRAM (TEE);  Surgeon: Ivin Poot, MD;  Location: [facility];  Service: Open Heart Surgery;  Laterality: N/A;  ? URETEROSCOPY WITH HOLMIUM LASER LITHOTRIPSY Bilateral 2004;  2005  dr Risa Grill  '@[facility]'$   ? VASECTOMY

## 2022-01-23 NOTE — Assessment & Plan Note (Signed)
No residual CSME OS.  Minor changes foveal he but on the nasal aspect not affecting quality vision will continue to observe follow-up in 4 months ?

## 2022-01-23 NOTE — Assessment & Plan Note (Signed)
OD looks great, vastly improved status post focal laser treatment, resolved. ?

## 2022-01-23 NOTE — Assessment & Plan Note (Signed)
OD with residual central vitreous floaters not impactful at this time ?

## 2022-01-23 NOTE — Assessment & Plan Note (Signed)
Continues to be compliant with CPAP use, coincidentally patient has improved sense of wellbeing, improved energy, improved sleep patterns, and most importantly enhanced and maximal oxygenation of macular region in the retina throughout the evening as compared to prior untreated episodic hypoxia, hypertension untreated undiagnosed sleep apnea. ? ?No longer mines the use of the machine ?

## 2022-01-23 NOTE — Assessment & Plan Note (Signed)
Small inferotemporal portion of the posterior pole, no change over the intervening 1 year ?

## 2022-03-13 ENCOUNTER — Other Ambulatory Visit: Payer: Self-pay

## 2022-03-13 DIAGNOSIS — I480 Paroxysmal atrial fibrillation: Secondary | ICD-10-CM

## 2022-03-13 MED ORDER — APIXABAN 5 MG PO TABS: ORAL_TABLET | ORAL | 1 refills | Status: DC

## 2022-03-13 NOTE — Telephone Encounter (Signed)
Prescription refill request for Eliquis received. Indication: Afib  Last office visit: 01/22/22 ([place])  Scr: 1.99 (11/06/21) Age: 76 Weight: 85.5kg  Appropriate dose and refill sent to requested pharmacy.

## 2022-03-21 ENCOUNTER — Ambulatory Visit (INDEPENDENT_AMBULATORY_CARE_PROVIDER_SITE_OTHER): Payer: Medicare Other | Admitting: Podiatry

## 2022-03-21 ENCOUNTER — Encounter: Payer: Self-pay | Admitting: Podiatry

## 2022-03-21 DIAGNOSIS — M79675 Pain in left toe(s): Secondary | ICD-10-CM | POA: Diagnosis not present

## 2022-03-21 DIAGNOSIS — N1832 Chronic kidney disease, stage 3b: Secondary | ICD-10-CM | POA: Insufficient documentation

## 2022-03-21 DIAGNOSIS — B351 Tinea unguium: Secondary | ICD-10-CM

## 2022-03-21 DIAGNOSIS — M79674 Pain in right toe(s): Secondary | ICD-10-CM | POA: Diagnosis not present

## 2022-03-21 DIAGNOSIS — E1151 Type 2 diabetes mellitus with diabetic peripheral angiopathy without gangrene: Secondary | ICD-10-CM | POA: Diagnosis not present

## 2022-03-28 NOTE — Progress Notes (Signed)
  Subjective:  Patient ID: Fernando Hess, male    DOB: 1946/08/19,  MRN: [id number]  MARSHAL ESKEW presents to clinic today for at risk foot care. Pt has h/o NIDDM with PAD and painful elongated mycotic toenails 1-5 bilaterally which are tender when wearing enclosed shoe gear. Pain is relieved with periodic professional debridement.  Patient states blood glucose was 118 mg/dl today.  Last A1c was 10.5%.  New problem(s): None.   PCP is [name], MD , and last visit was  March 19, 2022  Allergies  Allergen Reactions   Cilostazol Swelling    edema Other reaction(s): edema Other reaction(s): edema   Dulaglutide Nausea And Vomiting    Other reaction(s): vomiting Other reaction(s): vomiting   Levofloxacin Itching, Rash and Hives    Other reaction(s): hives   Liraglutide Other (See Comments)    Severe fatigue & insomnia Other reaction(s): severe fatigue and insomnia   Lisinopril Itching, Rash and Cough    Other reaction(s): cough    Review of Systems: Negative except as noted in the HPI.  Objective: No changes noted in today's physical examination. There were no vitals filed for this visit.  BRAYON BIELEFELD is a pleasant 76 y.o. male in NAD. AAO X 3.  Vascular Examination: CFT <4 seconds b/l LE. Faintly palpable DP pulses b/l LE. Faintly palpable PT pulse(s) b/l LE. Pedal hair absent. No pain with calf compression b/l. Lower extremity skin temperature gradient warm to cool. Trace edema noted BLE. No ischemia or gangrene noted b/l LE. No cyanosis or clubbing noted b/l LE.  Dermatological Examination: Pedal skin thin, shiny and atrophic b/l LE. No open wounds b/l LE. No interdigital macerations noted b/l LE. Toenails 1-5 b/l elongated, discolored, dystrophic, thickened, crumbly with subungual debris and tenderness to dorsal palpation. No hyperkeratotic nor porokeratotic lesions present on today's visit.  Musculoskeletal Examination: Muscle strength 5/5 to all lower extremity  muscle groups bilaterally. HAV with bunion deformity noted b/l LE. Hammertoe deformity noted 2-5 b/l.  Neurological Examination: Protective sensation intact 5/5 intact bilaterally with 10g monofilament b/l. Vibratory sensation decreased b/l.    Latest Ref Rng & Units 08/27/2021    8:27 AM 05/11/2021    6:16 AM  Hemoglobin A1C  Hemoglobin-A1c 4.8 - 5.6 % 11.0  11.5    Assessment/Plan: 1. Pain due to onychomycosis of toenails of both feet   2. Type II diabetes mellitus with peripheral circulatory disorder ([name])     -Examined patient. -Patient to continue soft, supportive shoe gear daily. -Toenails 1-5 b/l were debrided in length and girth with sterile nail nippers and dremel without iatrogenic bleeding.  -Patient/POA to call should there be question/concern in the interim.   Return in about 3 months (around 06/21/2022).  Marzetta Board, DPM

## 2022-04-01 ENCOUNTER — Other Ambulatory Visit: Payer: Self-pay | Admitting: Specialist

## 2022-04-01 DIAGNOSIS — G7001 Myasthenia gravis with (acute) exacerbation: Secondary | ICD-10-CM

## 2022-04-16 ENCOUNTER — Other Ambulatory Visit: Payer: Self-pay | Admitting: Internal Medicine

## 2022-04-16 MED ORDER — ZALEPLON 5 MG PO CAPS: ORAL_CAPSULE | ORAL | 5 refills | Status: DC

## 2022-04-16 NOTE — Telephone Encounter (Signed)
Zaleplon refilled at [facility]

## 2022-04-19 NOTE — Progress Notes (Signed)
HPI M never smoker followed for OSA, complicated by CAD/ MI/ RBBB/ CABG, PAD, HTN, dCHF, OSA, DM2/ retinopathy, CKD3, Myasthenia Gravis,  ENT/ Fernando Hess/ turbinate reduction 08/30/21. NPSG ([facility]) 11/25/18- AHI 37.9/ hr, desaturation to 79%, body weight 201 lbs Walk Test Room Air 06/18/21- 3 Steady laps- lowest O2 sat 95%, max HR 103. CPET 11/20/21- normal with exercise limitedd by deconditioning and body habitus ================================================================   04/21/22- [name] never smoker followed for OSA, complicated by CAD/ MI/ RBBB/ CABG, PAD, HTN, dCHF,  DM2/ retinopathy, CKD3, Myasthenia Gravis -[name] 5, Melatonin, Cymbalta, Albuterol hfa,  CPAP- auto 5-15 / [employer]        AirSense 10 AutoSet                           Wife here Download compliance- 43%, AHI 2.8/ hr Body weight today-   189 lbs                              Covid vax-3 Phizer He says he is too short of breath for PFT today. Weak effort on spirometry-moderate obstruction. Cites newly diagnosed Myasthenia Gravis for which [facility] just started IV therapy.  Arrival room air O2 sat 95%, HR 87%. Has been using a CPAP ozone cleaner- discussed. Asks about alternatives to CPAP. Discussed oral appliances. BMI 31- possible Inspire candidate later,  but I don't know about the MG. PFT 04/21/22- Per Tech-Patient refused DLCP/ Pleth today CPET 11/20/21- normal with exercise limited by deconditioning and body habitus Pending CT chest 04/29/22- CXR 10/05/20- IMPRESSION: Negative for acute cardiopulmonary disease   ROS-see HPI   + = positive Constitutional:    weight loss, night sweats, fevers, chills, fatigue, lassitude. HEENT:    headaches, difficulty swallowing, tooth/dental problems, sore throat,       sneezing, itching, ear ache, nasal congestion, post nasal drip, snoring CV:    chest pain, orthopnea, PND, swelling in lower extremities, anasarca,                                dizziness,  palpitations Resp:   +shortness of breath with exertion or at rest.                productive cough,   non-productive cough, coughing up of blood.              change in color of mucus.  wheezing.   Skin:    rash or lesions. GI:  No-   heartburn, indigestion, abdominal pain, nausea, vomiting, diarrhea,                 change in bowel habits, loss of appetite GU: dysuria, change in color of urine, no urgency or frequency.   flank pain. MS:   joint pain, stiffness, decreased range of motion, back pain. Neuro-     nothing unusual Psych:  change in mood or affect.  depression or anxiety.   memory loss.   OBJ- Physical Exam     Unlabored General- Alert, Oriented, Affect-appropriate, Distress- none acute, +obese Skin- rash-none, lesions- none, excoriation- none Lymphadenopathy- none Head- atraumatic            Eyes- Gross vision intact, PERRLA, conjunctivae and secretions clear            Ears- Hearing, canals-normal  Nose- Clear, no-Septal dev, mucus, polyps, erosion, perforation             Throat- Mallampati III , mucosa clear , drainage- none, tonsils- atrophic,  +teeth/ some repair Neck- flexible , trachea midline, no stridor , thyroid nl, carotid no bruit Chest - symmetrical excursion , unlabored           Heart/CV- RRR , no murmur , no gallop  , no rub, nl s1 s2                           - JVD- none , edema- none, stasis changes- none, varices- none           Lung- clear to P&A, wheeze- none, cough- none , dullness-none, rub- none           Chest wall-  Abd-  Br/ Gen/ Rectal- Not done, not indicated Extrem- cyanosis- none, clubbing, none, atrophy- none, strength- nl Neuro- grossly intact to observation

## 2022-04-21 ENCOUNTER — Encounter: Payer: Self-pay | Admitting: Internal Medicine

## 2022-04-21 ENCOUNTER — Ambulatory Visit (INDEPENDENT_AMBULATORY_CARE_PROVIDER_SITE_OTHER): Payer: Medicare Other | Admitting: Internal Medicine

## 2022-04-21 VITALS — BP 132/70 | HR 87 | Temp 98.1°F | Ht 65.0 in | Wt 189.0 lb

## 2022-04-21 DIAGNOSIS — G4733 Obstructive sleep apnea (adult) (pediatric): Secondary | ICD-10-CM

## 2022-04-21 DIAGNOSIS — Z9989 Dependence on other enabling machines and devices: Secondary | ICD-10-CM | POA: Diagnosis not present

## 2022-04-21 DIAGNOSIS — G7000 Myasthenia gravis without (acute) exacerbation: Secondary | ICD-10-CM | POA: Diagnosis not present

## 2022-04-21 LAB — PULMONARY FUNCTION TEST
FEF 25-75 Pre: 1.11 L/sec
FEF2575-%Pred-Pre: 68 %
FEV1-%Pred-Pre: 59 %
FEV1-Pre: 1.38 L
FEV1FVC-%Pred-Pre: 79 %
FEV6-%Pred-Pre: 78 %
FEV6-Pre: 2.41 L
FEV6FVC-%Pred-Pre: 108 %
FVC-%Pred-Pre: 73 %
FVC-Pre: 2.42 L
Pre FEV1/FVC ratio: 57 %
Pre FEV6/FVC Ratio: 100 %

## 2022-04-21 NOTE — Assessment & Plan Note (Signed)
New dx reported by patient Being treated by [facility]

## 2022-04-21 NOTE — Patient Instructions (Signed)
Order- referral to Dr Oneal Grout, Orthodontist- consider oral appliance for OSA  I hope the myasthenia treatments help

## 2022-04-21 NOTE — Assessment & Plan Note (Signed)
CPAP provides good control, but struggles with compliance. Plan- refer to Orthodontics to consider oral appliance as alternative

## 2022-04-28 ENCOUNTER — Other Ambulatory Visit: Payer: Medicare Other

## 2022-04-29 ENCOUNTER — Ambulatory Visit
Admission: RE | Admit: 2022-04-29 | Discharge: 2022-04-29 | Disposition: A | Discharge status: Home / Self Care | Payer: Medicare Other | Source: Ambulatory Visit | Attending: Specialist | Admitting: Specialist

## 2022-04-29 DIAGNOSIS — G7001 Myasthenia gravis with (acute) exacerbation: Secondary | ICD-10-CM

## 2022-05-26 ENCOUNTER — Other Ambulatory Visit (HOSPITAL_COMMUNITY): Payer: Self-pay

## 2022-05-27 ENCOUNTER — Encounter (INDEPENDENT_AMBULATORY_CARE_PROVIDER_SITE_OTHER): Payer: Self-pay | Admitting: Ophthalmology

## 2022-05-27 ENCOUNTER — Ambulatory Visit (INDEPENDENT_AMBULATORY_CARE_PROVIDER_SITE_OTHER): Payer: Medicare Other | Admitting: Ophthalmology

## 2022-05-27 DIAGNOSIS — E113311 Type 2 diabetes mellitus with moderate nonproliferative diabetic retinopathy with macular edema, right eye: Secondary | ICD-10-CM | POA: Diagnosis not present

## 2022-05-27 DIAGNOSIS — E113312 Type 2 diabetes mellitus with moderate nonproliferative diabetic retinopathy with macular edema, left eye: Secondary | ICD-10-CM

## 2022-05-27 DIAGNOSIS — G7000 Myasthenia gravis without (acute) exacerbation: Secondary | ICD-10-CM

## 2022-05-27 DIAGNOSIS — G4733 Obstructive sleep apnea (adult) (pediatric): Secondary | ICD-10-CM

## 2022-05-27 DIAGNOSIS — H43813 Vitreous degeneration, bilateral: Secondary | ICD-10-CM

## 2022-05-27 DIAGNOSIS — Z9989 Dependence on other enabling machines and devices: Secondary | ICD-10-CM

## 2022-05-27 NOTE — Progress Notes (Signed)
05/27/2022     CHIEF COMPLAINT Patient presents for  Chief Complaint  Patient presents with   Diabetic Retinopathy with Macular Edema      HISTORY OF PRESENT ILLNESS: Fernando Hess is a 76 y.o. male who presents to the clinic today for:   HPI   4 MOS for DILATE OU, COLOR FP, OCT. Pt stated vision has not changed since last visit. Pt has allergy to levofloxacin.   Last edited by Silvestre Moment on 05/27/2022  8:21 AM.      Referring physician: [name], MD [place],  [place] [address]  HISTORICAL INFORMATION:   Selected notes from the MEDICAL RECORD NUMBER    Lab Results  Component Value Date   HGBA1C 11.0 (H) 08/27/2021     CURRENT MEDICATIONS: No current outpatient medications on file. (Ophthalmic Drugs)   No current facility-administered medications for this visit. (Ophthalmic Drugs)   Current Outpatient Medications (Other)  Medication Sig   acetaminophen (TYLENOL) 325 MG tablet Take 325 mg by mouth every 6 (six) hours as needed for headache (pain).   acetaminophen-codeine (TYLENOL #3) 300-30 MG tablet Take 1 tablet by mouth every 6 (six) hours as needed.   albuterol (VENTOLIN HFA) 108 (90 Base) MCG/ACT inhaler INHALE 1-2 PUFFS into THE lungs EVERY 4 TO 6 HOURS AS NEEDED   ALPRAZolam (XANAX) 0.25 MG tablet Take for anxiety 1 hour before PFT   apixaban (ELIQUIS) 5 MG TABS tablet TAKE ONE TABLET BY MOUTH EVERY MORNING and TAKE ONE TABLET BY MOUTH EVERY EVENING   atorvastatin (LIPITOR) 80 MG tablet Take 1 tablet (80 mg total) by mouth daily.   B-D UF III MINI PEN NEEDLES 31G X 5 MM MISC USE AS DIRECTED WITH LANTUS SOLOSTAR   busPIRone (BUSPAR) 10 MG tablet Take 1 tablet by mouth daily.   cilostazol (PLETAL) 50 MG tablet Take 1 tablet by mouth 2 (two) times daily.   Continuous Blood Gluc Sensor (FREESTYLE LIBRE 2 SENSOR) MISC replace sensor every TWO WEEKS   DULoxetine (CYMBALTA) 30 MG capsule Take 30 mg by mouth every morning. Take with a 60 mg  capsule for a total morning dose of 90 mg   DULoxetine (CYMBALTA) 60 MG capsule Take 60 mg by mouth every morning. Take with a 30 mg capsule for a total morning dose of 90 mg   fluticasone (FLONASE) 50 MCG/ACT nasal spray Instill 2 puffs each nostril every night.   furosemide (LASIX) 20 MG tablet Take 20 mg by mouth daily.   glimepiride (AMARYL) 4 MG tablet Take 1 tablet by mouth 2 (two) times daily.   guaiFENesin-codeine (ROBITUSSIN AC) 100-10 MG/5ML syrup take 57m BY MOUTH EVERY 4 HOURS AS NEEDED   HYDROcodone-acetaminophen (NORCO/VICODIN) 5-325 MG tablet Take 1 tablet by mouth every 4 (four) hours as needed.   hydrOXYzine (VISTARIL) 25 MG capsule Take 25 mg by mouth at bedtime as needed for itching (sleep).   ibuprofen (ADVIL) 600 MG tablet    Insulin Glargine (BASAGLAR KWIKPEN) 100 UNIT/ML SOPN Inject 40 Units into the skin daily.   insulin lispro (HUMALOG) 100 UNIT/ML KwikPen Inject 14 Units into the skin 3 (three) times daily.   MAGNESIUM PO Take 350 mg by mouth every evening.   Melatonin 3 MG TABS Take 3 mg by mouth at bedtime.   metoprolol succinate (TOPROL-XL) 25 MG 24 hr tablet Take 0.5 tablets by mouth every morning.   Multiple Vitamin (MULTIVITAMIN WITH MINERALS) TABS tablet Take 1 tablet by  mouth every morning.   nitroGLYCERIN (NITROSTAT) 0.4 MG SL tablet Place 0.4 mg under the tongue every 5 (five) minutes as needed for chest pain.   ondansetron (ZOFRAN-ODT) 4 MG disintegrating tablet Take 4 mg by mouth every 8 (eight) hours as needed for nausea or vomiting.   pantoprazole (PROTONIX) 40 MG tablet TAKE 1 TABLET BY MOUTH EVERY DAY   pantoprazole (PROTONIX) 40 MG tablet Take 1 tablet by mouth daily.   pioglitazone (ACTOS) 45 MG tablet Take 1 tablet by mouth daily.   promethazine (PHENERGAN) 25 MG tablet TAKE 1 TABLET BY MOUTH EVERY 6 HOURS AS NEEDED FOR NAUSEA FOR 10 DAYS   pyridostigmine (MESTINON) 60 MG tablet 1 tablet   pyridostigmine (MESTINON) 60 MG tablet Take by mouth.    valsartan (DIOVAN) 160 MG tablet Take 1 tablet by mouth daily.   zaleplon ([name]) 5 MG capsule TAKE ONE CAPSULE BY MOUTH everyday AT bedtime   Current Facility-Administered Medications (Other)  Medication Route   sodium chloride flush (NS) 0.9 % injection 3 mL Intravenous      REVIEW OF SYSTEMS: ROS   Negative for: Constitutional, Gastrointestinal, Neurological, Skin, Genitourinary, Musculoskeletal, HENT, Endocrine, Cardiovascular, Eyes, Respiratory, Psychiatric, Allergic/Imm, Heme/Lymph Last edited by Silvestre Moment on 05/27/2022  8:20 AM.       ALLERGIES Allergies  Allergen Reactions   Cilostazol Swelling    edema Other reaction(s): edema Other reaction(s): edema   Dulaglutide Nausea And Vomiting    Other reaction(s): vomiting Other reaction(s): vomiting   Levofloxacin Itching, Rash and Hives    Other reaction(s): hives   Liraglutide Other (See Comments)    Severe fatigue & insomnia Other reaction(s): severe fatigue and insomnia   Lisinopril Itching, Rash and Cough    Other reaction(s): cough    PAST MEDICAL HISTORY Past Medical History:  Diagnosis Date   Acute renal failure (ARF) ([place]) 09/24/2015   Atrial premature complexes    CAD (coronary artery disease) of artery bypass graft    Early occlusion of saphenous vein graft to intermediate and marginal branch in February 2007 following bypass grafting    CAD (coronary artery disease), native coronary artery 2017   hx NSTEMI 09-24-2015  s/p  CABG x5 on 10-02-2015;  post op STEMI inferolateral wall,  SVG OM1 and SVG OM2 occluded, distal OM occlusion the calpruit, treated medically // Myoview 7/21: no ischemia, EF 65, low risk   CHF (congestive heart failure) ([name])    CKD (chronic kidney disease), stage III ([name])    Dyspnea    Elevated troponin    Erectile dysfunction    Esophageal reflux    History of atrial fibrillation    post op CABG 10-02-2015   History of kidney stones    History of non-ST elevation myocardial  infarction (NSTEMI) 09/24/2015   s/p  CABG x5   History of ST elevation myocardial infarction (STEMI) 10/22/2015   inferior wall,  post op CABG 10-02-2015   Hyperlipidemia    Hypertension    Left ureteral stone    Mild atherosclerosis of both carotid arteries    Nephrolithiasis    per CT bilateral non-obstructive calculi   OSA (obstructive sleep apnea)    Peripheral artery disease    LE Arterial US 01/2019: R PTA and ATA occluded; L ATA occluded   RBBB (right bundle branch block)    Renal atrophy, right    Sleep apnea    wears cpap    ST elevation myocardial infarction (STEMI) of inferior wall ([place]) 10/22/2015  Type 2 diabetes mellitus treated with insulin ([place])    followed by pcp   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 ([place]) 03/01/2008   Wears glasses    Past Surgical History:  Procedure Laterality Date   [place] N/A 09/26/2015   Procedure: Left Heart Cath and Coronary Angiography;  Surgeon: Troy Sine, MD;  Location: [facility];  Service: Cardiovascular;  Laterality: N/A;   CARDIAC CATHETERIZATION N/A 10/22/2015   Procedure: Left Heart Cath and Coronary Angiography;  Surgeon: Sherren Mocha, MD;  Location: [facility];  Service: Cardiovascular;  Laterality: N/A;   CATARACT EXTRACTION W/ INTRAOCULAR LENS  IMPLANT, BILATERAL  2017   COLONOSCOPY     CORONARY ARTERY BYPASS GRAFT N/A 10/02/2015   Procedure: CORONARY ARTERY BYPASS GRAFTING (CABG) X5 LIMA-LAD; SVG-DIAG; SVG-OM; SVG-PD; SVG-RAMUS TRANSESOPHAGEAL ECHOCARDIOGRAM (TEE) ENDOSCOPIC GREATER SAPHENOUS VEIN  HARVEST BILAT LE;  Surgeon: Ivin Poot, MD;  Location: [facility];  Service: Open Heart Surgery;  Laterality: N/A;   CYSTOSCOPY/URETEROSCOPY/HOLMIUM LASER/STENT PLACEMENT Left 08/10/2018   Procedure: CYSTOSCOPY/URETEROSCOPY/HOLMIUM LASER/STENT PLACEMENT;  Surgeon: Festus Aloe, MD;  Location:  [facility];  Service: Urology;  Laterality: Left;   CYSTOSCOPY/URETEROSCOPY/HOLMIUM LASER/STENT PLACEMENT Left 09/10/2018   Procedure: CYSTOSCOPY/URETEROSCOPY/HOLMIUM LASER/STENT EXCHANGE;  Surgeon: Festus Aloe, MD;  Location: [facility];  Service: Urology;  Laterality: Left;   LEFT HEART CATHETERIZATION WITH CORONARY ANGIOGRAM N/A 04/13/2014   Procedure: LEFT HEART CATHETERIZATION WITH CORONARY ANGIOGRAM;  Surgeon: Jacolyn Reedy, MD;  Location: [facility] CATH LAB;  Service: Cardiovascular;  Laterality: N/A;   LEG SURGERY Right age 39   closed reduction leg fracture   NASAL SEPTOPLASTY W/ TURBINOPLASTY Bilateral 08/30/2021   Procedure: NASAL SEPTOPLASTY WITH BILATERAL INFERIOR TURBINATE REDUCTION;  Surgeon: Jerrell Belfast, MD;  Location: [facility];  Service: ENT;  Laterality: Bilateral;   POLYPECTOMY     RIGHT HEART CATH N/A 06/06/2021   Procedure: RIGHT HEART CATH;  Surgeon: Jolaine Artist, MD;  Location: [facility];  Service: Cardiovascular;  Laterality: N/A;   TEE WITHOUT CARDIOVERSION N/A 10/02/2015   Procedure: TRANSESOPHAGEAL ECHOCARDIOGRAM (TEE);  Surgeon: Ivin Poot, MD;  Location: [facility];  Service: Open Heart Surgery;  Laterality: N/A;   URETEROSCOPY WITH HOLMIUM LASER LITHOTRIPSY Bilateral 2004;  2005  dr Risa Hess  '@[facility]'$    VASECTOMY      FAMILY HISTORY Family History  Problem Relation Age of Onset   Heart attack Mother    Heart attack Father    Diabetes Brother    Pancreatic cancer Brother    Diabetes Brother    Colon cancer Neg Hx    Esophageal cancer Neg Hx    Prostate cancer Neg Hx    Rectal cancer Neg Hx    Stomach cancer Neg Hx    Colon polyps Neg Hx     SOCIAL HISTORY Social History   Tobacco Use   Smoking status: Never   Smokeless tobacco: Never  Vaping Use   Vaping Use: Never used  Substance Use Topics   Alcohol use: Not Currently   Drug use: Never         OPHTHALMIC EXAM:  Base Eye Exam     Visual Acuity (ETDRS)        Right Left   Dist cc 20/40 -2 +2 20/40 -1 +1   Dist ph cc NI NI    Correction: Glasses  Tonometry (Tonopen, 8:27 AM)       Right Left   Pressure 14 15         Pupils       Pupils APD   Right PERRL None   Left PERRL None         Visual Fields       Left Right    Full Full         Extraocular Movement       Right Left    Full, Ortho Full, Ortho         Neuro/Psych     Oriented x3: Yes   Mood/Affect: Normal         Dilation     Both eyes: 1.0% Mydriacyl, 2.5% Phenylephrine @ 8:27 AM           Slit Lamp and Fundus Exam     External Exam       Right Left   External Normal Normal         Slit Lamp Exam       Right Left   Lids/Lashes Normal Normal   Conjunctiva/Sclera White and quiet White and quiet   Cornea Clear Clear   Anterior Chamber Deep and quiet Deep and quiet   Iris Round and reactive Round and reactive   Lens Posterior chamber intraocular lens Posterior chamber intraocular lens   Anterior Vitreous Normal Normal         Fundus Exam       Right Left   Posterior Vitreous Posterior vitreous detachment, Central vitreous floaters Posterior vitreous detachment, Central vitreous floaters   Disc Normal Normal   C/D Ratio 0.5 0.55   Macula Macular thickening superotemporal to fovea., Microaneurysms, Exudates no clinically significant macular edema, no macular thickening, Microaneurysms   Vessels NPDR- Moderate NPDR- Moderate   Periphery Choroidal nevus, small in macula,, stable, 2 dd size, flat Dot-blot hemorrhage            IMAGING AND PROCEDURES  Imaging and Procedures for 05/27/22  OCT, Retina - OU - Both Eyes       Right Eye Quality was good. Scan locations included subfoveal. Central Foveal Thickness: 264. Progression has improved. Findings include abnormal foveal contour, cystoid macular edema.   Left Eye Quality was good. Scan locations included subfoveal. Central Foveal Thickness: 251. Progression has  been stable. Findings include abnormal foveal contour.   Notes CSME superotemporal to the fovea OD, nearly resolved OD today as compared to January 2023,      Color Fundus Photography Optos - OU - Both Eyes       Right Eye Progression has worsened. Disc findings include normal observations. Macula : microaneurysms. Periphery : normal observations.   Left Eye Progression has worsened. Disc findings include normal observations. Macula : microaneurysms. Periphery : normal observations.   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 Good blood glucose, blood pressure, and serum lipid control was recommended as well as avoidance of smoking and maintenance of normal weight.  Close follow up with PCP encouraged.  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      ICD-10-CM   1. 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 OSA on CPAP  G47.33    Z99.89     4. Myasthenia gravis ([place])  G70.00     5. Posterior vitreous detachment of both eyes  H43.813        1.  OU doing well.  No sign of progression of diabetic retinopathy no recurrence of CSME.  2.  PVD OU.  Stable  3.  No secondary ocular side effects at this time from EMG  Ophthalmic Meds Ordered this visit:  No orders of the defined types were placed in this encounter.      Return in about 4 months (around 09/26/2022) for DILATE OU, COLOR FP, OCT.  There are no Patient Instructions on file for this visit.   Explained the diagnoses, plan, and follow up with the patient and they expressed understanding.  Patient expressed understanding of the importance of proper follow up care.   Clent Demark Calandria Mullings M.D. Diseases & Surgery of the Retina and Vitreous Retina & Diabetic [facility] 05/27/22     Abbreviations: M myopia (nearsighted); A astigmatism; H hyperopia (farsighted); P presbyopia; Mrx spectacle prescription;  CTL contact lenses; OD right eye; OS left eye; OU both eyes  XT exotropia; ET esotropia; PEK punctate epithelial keratitis; PEE punctate epithelial erosions; DES dry eye syndrome; MGD meibomian gland dysfunction; ATs artificial tears; PFAT's preservative free artificial tears; [place] nuclear sclerotic cataract; PSC posterior subcapsular cataract; ERM epi-retinal membrane; PVD posterior vitreous detachment; RD retinal detachment; DM diabetes mellitus; DR diabetic retinopathy; NPDR non-proliferative diabetic retinopathy; PDR proliferative diabetic retinopathy; CSME clinically significant macular edema; DME diabetic macular edema; dbh dot blot hemorrhages; CWS cotton wool spot; POAG primary open angle glaucoma; C/D cup-to-disc ratio; HVF humphrey visual field; GVF goldmann visual field; OCT optical coherence tomography; IOP intraocular pressure; BRVO Branch retinal vein occlusion; CRVO central retinal vein occlusion; CRAO central retinal artery occlusion; BRAO branch retinal artery occlusion; RT retinal tear; SB scleral buckle; PPV pars plana vitrectomy; VH Vitreous hemorrhage; PRP  panretinal laser photocoagulation; IVK intravitreal kenalog; VMT vitreomacular traction; MH Macular hole;  NVD neovascularization of the disc; NVE neovascularization elsewhere; AREDS age related eye disease study; ARMD age related macular degeneration; POAG primary open angle glaucoma; EBMD epithelial/anterior basement membrane dystrophy; ACIOL anterior chamber intraocular lens; IOL intraocular lens; PCIOL posterior chamber intraocular lens; Phaco/IOL phacoemulsification with intraocular lens placement; [place] photorefractive keratectomy; LASIK laser assisted in situ keratomileusis; HTN hypertension; DM diabetes mellitus; COPD chronic obstructive pulmonary disease

## 2022-05-27 NOTE — Assessment & Plan Note (Signed)
Stable OU 

## 2022-05-27 NOTE — Assessment & Plan Note (Signed)
The nature of moderate nonproliferative diabetic retinopathy was discussed with the patient as well as the need for more frequent follow up to judge for progression. Good blood glucose, blood pressure, and serum lipid control was recommended as well as avoidance of smoking and maintenance of normal weight.  Close follow up with PCP encouraged.  OD stable, macular edema resolved

## 2022-05-27 NOTE — Assessment & Plan Note (Signed)
Moderate no progression, macular edema resolved

## 2022-05-27 NOTE — Assessment & Plan Note (Signed)
Maintains use, having trouble with machine right now

## 2022-05-27 NOTE — Assessment & Plan Note (Signed)
Ongoing therapy

## 2022-06-08 NOTE — Progress Notes (Unsigned)
HPI Fernando Hess never smoker followed for OSA, complicated by CAD/ MI/ RBBB/ CABG, PAD, HTN, dCHF, OSA, DM2/ retinopathy, CKD3, Myasthenia Gravis,  ENT/ [facility]- Septoplasty/ turbinate reduction 08/30/21. NPSG ([facility]) 11/25/18- AHI 37.9/ hr, desaturation to 79%, body weight 201 lbs Walk Test Room Air 06/18/21- 3 Steady laps- lowest O2 sat 95%, max HR 103. PFT 04/21/22- Moderate Obstruction, weak effort. Per Tech-Patient refused DLCP/ Pleth today CPET 11/20/21- normal with exercise limitedd by deconditioning and body habitus ================================================================   04/21/22- Fernando Hess never smoker followed for OSA, complicated by CAD/ MI/ RBBB/ CABG, PAD, HTN, dCHF,  DM2/ retinopathy, CKD3, Myasthenia Gravis -[name] 5, Melatonin, Cymbalta, Albuterol hfa,  CPAP- auto 5-15 / [employer]        AirSense 10 AutoSet                           Wife here Download compliance- 43%, AHI 2.8/ hr Body weight today-   189 lbs                              Covid vax-3 Phizer He says he is too short of breath for PFT today. Weak effort on spirometry-moderate obstruction. Cites newly diagnosed Myasthenia Gravis for which [facility] just started IV therapy.  Arrival room air O2 sat 95%, HR 87%. Has been using a CPAP ozone cleaner- discussed. Asks about alternatives to CPAP. Discussed oral appliances. BMI 31- possible Inspire candidate later,  but I don't know about the MG. PFT 04/21/22- Per Tech-Patient refused DLCP/ Pleth today CPET 11/20/21- normal with exercise limited by deconditioning and body habitus Pending CT chest 04/29/22- CXR 10/05/20- IMPRESSION: Negative for acute cardiopulmonary disease  06/10/22- Fernando Hess never smoker followed for OSA, complicated by CAD/ MI/ RBBB/ CABG/ AFib, PAD, HTN, dCHF,  DM2/ retinopathy, CKD3, Myasthenia Gravis -[name] 5, Melatonin, Cymbalta, Albuterol hfa,  CPAP- auto 5-15 / [employer]        AirSense 10 AutoSet                           Wife here Download compliance- 14%, AHI  2.8/hr Body weight today-  184 lbs                             Covid vax-3 Phizer He got concerned that the Respironics recall and questions about Ozone cleaning devices might somehow also relate to his ResMed machine and might aggravate his Myasthenia Gravis. I told him I know of no such connection. He is going to resume use of his machine. We are out of senior strength flu vax- he chose to get standard flu vax today since here. Pulse IRR on palpation today. Wife confirms he has hx AFib/ Eliquis. CT chest 05/01/22- IMPRESSION: No acute pulmonary abnormality.   ROS-see HPI   + = positive Constitutional:    weight loss, night sweats, fevers, chills, fatigue, lassitude. HEENT:    headaches, difficulty swallowing, tooth/dental problems, sore throat,       sneezing, itching, ear ache, nasal congestion, post nasal drip, snoring CV:    chest pain, orthopnea, PND, swelling in lower extremities, anasarca,                                dizziness, palpitations Resp:   +shortness of  breath with exertion or at rest.                productive cough,   non-productive cough, coughing up of blood.              change in color of mucus.  wheezing.   Skin:    rash or lesions. GI:  No-   heartburn, indigestion, abdominal pain, nausea, vomiting, diarrhea,                 change in bowel habits, loss of appetite GU: dysuria, change in color of urine, no urgency or frequency.   flank pain. MS:   joint pain, stiffness, decreased range of motion, back pain. Neuro-     nothing unusual Psych:  change in mood or affect.  depression or anxiety.   memory loss.   OBJ- Physical Exam     Unlabored General- Alert, Oriented, Affect-appropriate, Distress- none acute, +obese Skin- rash-none, lesions- none, excoriation- none Lymphadenopathy- none Head- atraumatic            Eyes- Gross vision intact, PERRLA, conjunctivae and secretions clear            Ears- Hearing, canals-normal            Nose- Clear, no-Septal  dev, mucus, polyps, erosion, perforation             Throat- Mallampati III , mucosa clear , drainage- none, tonsils- atrophic,  +teeth/ some repair, +weak voice Neck- flexible , trachea midline, no stridor , thyroid nl, carotid no bruit Chest - symmetrical excursion , unlabored           Heart/CV- IRR/ AFib , no murmur , no gallop  , no rub, nl s1 s2                           - JVD- none , edema- none, stasis changes- none, varices- none           Lung- clear to P&A, wheeze- none, cough- none , dullness-none, rub- none           Chest wall-  Abd-  Br/ Gen/ Rectal- Not done, not indicated Extrem- cyanosis- none, clubbing, none, atrophy- none, strength- nl Neuro- grossly intact to observation

## 2022-06-10 ENCOUNTER — Ambulatory Visit (INDEPENDENT_AMBULATORY_CARE_PROVIDER_SITE_OTHER): Payer: Medicare Other | Admitting: Internal Medicine

## 2022-06-10 ENCOUNTER — Encounter: Payer: Self-pay | Admitting: Internal Medicine

## 2022-06-10 VITALS — BP 126/80 | HR 56 | Ht 66.0 in | Wt 184.0 lb

## 2022-06-10 DIAGNOSIS — G7000 Myasthenia gravis without (acute) exacerbation: Secondary | ICD-10-CM

## 2022-06-10 DIAGNOSIS — Z23 Encounter for immunization: Secondary | ICD-10-CM | POA: Diagnosis not present

## 2022-06-10 DIAGNOSIS — G4733 Obstructive sleep apnea (adult) (pediatric): Secondary | ICD-10-CM | POA: Diagnosis not present

## 2022-06-10 DIAGNOSIS — Z9989 Dependence on other enabling machines and devices: Secondary | ICD-10-CM | POA: Diagnosis not present

## 2022-06-10 DIAGNOSIS — I480 Paroxysmal atrial fibrillation: Secondary | ICD-10-CM

## 2022-06-10 MED ORDER — GUAIFENESIN-CODEINE 100-10 MG/5ML PO SYRP: ORAL_SOLUTION | ORAL | 0 refills | Status: DC

## 2022-06-10 NOTE — Assessment & Plan Note (Signed)
-  Followed by Neurology. °

## 2022-06-10 NOTE — Patient Instructions (Signed)
Order- flu vax standard  I do recommend you get back to using your CPAP every night. If you have more questions about cleaining it you can ask the [facility] [employer] company.

## 2022-06-10 NOTE — Assessment & Plan Note (Signed)
Exam consistent with AFib, rate controlled, managed by cardiology

## 2022-06-10 NOTE — Assessment & Plan Note (Signed)
He was concerned that something about Dreamstation recall might somehow imply risk to his Myasthenia Gravis. He uses a Pharmacist, community. Reassured. Plan- he will resume using CPAP

## 2022-06-17 ENCOUNTER — Emergency Department (HOSPITAL_COMMUNITY): Payer: Medicare Other

## 2022-06-17 ENCOUNTER — Emergency Department (HOSPITAL_COMMUNITY)
Admission: EM | Admit: 2022-06-17 | Discharge: 2022-06-17 | Disposition: A | Discharge status: Home / Self Care | Payer: Medicare Other | Attending: Emergency Medicine | Admitting: Emergency Medicine

## 2022-06-17 ENCOUNTER — Other Ambulatory Visit: Payer: Self-pay

## 2022-06-17 ENCOUNTER — Encounter (HOSPITAL_COMMUNITY): Payer: Self-pay | Admitting: Emergency Medicine

## 2022-06-17 DIAGNOSIS — I509 Heart failure, unspecified: Secondary | ICD-10-CM | POA: Diagnosis not present

## 2022-06-17 DIAGNOSIS — S8001XA Contusion of right knee, initial encounter: Secondary | ICD-10-CM | POA: Diagnosis not present

## 2022-06-17 DIAGNOSIS — S8002XA Contusion of left knee, initial encounter: Secondary | ICD-10-CM | POA: Diagnosis not present

## 2022-06-17 DIAGNOSIS — W01198A Fall on same level from slipping, tripping and stumbling with subsequent striking against other object, initial encounter: Secondary | ICD-10-CM | POA: Insufficient documentation

## 2022-06-17 DIAGNOSIS — I2510 Atherosclerotic heart disease of native coronary artery without angina pectoris: Secondary | ICD-10-CM | POA: Insufficient documentation

## 2022-06-17 DIAGNOSIS — S0003XA Contusion of scalp, initial encounter: Secondary | ICD-10-CM

## 2022-06-17 DIAGNOSIS — Z79899 Other long term (current) drug therapy: Secondary | ICD-10-CM | POA: Diagnosis not present

## 2022-06-17 DIAGNOSIS — Z794 Long term (current) use of insulin: Secondary | ICD-10-CM | POA: Insufficient documentation

## 2022-06-17 DIAGNOSIS — E113392 Type 2 diabetes mellitus with moderate nonproliferative diabetic retinopathy without macular edema, left eye: Secondary | ICD-10-CM | POA: Diagnosis not present

## 2022-06-17 DIAGNOSIS — I130 Hypertensive heart and chronic kidney disease with heart failure and stage 1 through stage 4 chronic kidney disease, or unspecified chronic kidney disease: Secondary | ICD-10-CM | POA: Diagnosis not present

## 2022-06-17 DIAGNOSIS — Z955 Presence of coronary angioplasty implant and graft: Secondary | ICD-10-CM | POA: Diagnosis not present

## 2022-06-17 DIAGNOSIS — Y93G1 Activity, food preparation and clean up: Secondary | ICD-10-CM | POA: Diagnosis not present

## 2022-06-17 DIAGNOSIS — Z7901 Long term (current) use of anticoagulants: Secondary | ICD-10-CM | POA: Diagnosis not present

## 2022-06-17 DIAGNOSIS — Z7984 Long term (current) use of oral hypoglycemic drugs: Secondary | ICD-10-CM | POA: Insufficient documentation

## 2022-06-17 DIAGNOSIS — S0990XA Unspecified injury of head, initial encounter: Secondary | ICD-10-CM | POA: Diagnosis present

## 2022-06-17 DIAGNOSIS — N183 Chronic kidney disease, stage 3 unspecified: Secondary | ICD-10-CM | POA: Insufficient documentation

## 2022-06-17 DIAGNOSIS — R0602 Shortness of breath: Secondary | ICD-10-CM | POA: Diagnosis not present

## 2022-06-17 DIAGNOSIS — S8000XA Contusion of unspecified knee, initial encounter: Secondary | ICD-10-CM

## 2022-06-17 LAB — CBC WITH DIFFERENTIAL/PLATELET
Abs Immature Granulocytes: 0.08 10*3/uL — ABNORMAL HIGH (ref 0.00–0.07)
Basophils Absolute: 0 10*3/uL (ref 0.0–0.1)
Basophils Relative: 0 %
Eosinophils Absolute: 0.1 10*3/uL (ref 0.0–0.5)
Eosinophils Relative: 0 %
HCT: 44.5 % (ref 39.0–52.0)
Hemoglobin: 14.7 g/dL (ref 13.0–17.0)
Immature Granulocytes: 1 %
Lymphocytes Relative: 11 %
Lymphs Abs: 1.5 10*3/uL (ref 0.7–4.0)
MCH: 28.7 pg (ref 26.0–34.0)
MCHC: 33 g/dL (ref 30.0–36.0)
MCV: 86.7 fL (ref 80.0–100.0)
Monocytes Absolute: 1 10*3/uL (ref 0.1–1.0)
Monocytes Relative: 7 %
Neutro Abs: 11.8 10*3/uL — ABNORMAL HIGH (ref 1.7–7.7)
Neutrophils Relative %: 81 %
Platelets: 210 10*3/uL (ref 150–400)
RBC: 5.13 MIL/uL (ref 4.22–5.81)
RDW: 14.9 % (ref 11.5–15.5)
WBC: 14.6 10*3/uL — ABNORMAL HIGH (ref 4.0–10.5)
nRBC: 0 % (ref 0.0–0.2)

## 2022-06-17 LAB — BASIC METABOLIC PANEL
Anion gap: 6 (ref 5–15)
BUN: 33 mg/dL — ABNORMAL HIGH (ref 8–23)
CO2: 27 mmol/L (ref 22–32)
Calcium: 8.9 mg/dL (ref 8.9–10.3)
Chloride: 103 mmol/L (ref 98–111)
Creatinine, Ser: 2.27 mg/dL — ABNORMAL HIGH (ref 0.61–1.24)
GFR, Estimated: 29 mL/min — ABNORMAL LOW (ref >60)
Glucose, Bld: 200 mg/dL — ABNORMAL HIGH (ref 70–99)
Potassium: 4.7 mmol/L (ref 3.5–5.1)
Sodium: 136 mmol/L (ref 135–145)

## 2022-06-17 LAB — I-STAT CHEM 8, ED
BUN: 35 mg/dL — ABNORMAL HIGH (ref 8–23)
Calcium, Ion: 1.14 mmol/L — ABNORMAL LOW (ref 1.15–1.40)
Chloride: 103 mmol/L (ref 98–111)
Creatinine, Ser: 2.2 mg/dL — ABNORMAL HIGH (ref 0.61–1.24)
Glucose, Bld: 196 mg/dL — ABNORMAL HIGH (ref 70–99)
HCT: 46 % (ref 39.0–52.0)
Hemoglobin: 15.6 g/dL (ref 13.0–17.0)
Potassium: 4.6 mmol/L (ref 3.5–5.1)
Sodium: 137 mmol/L (ref 135–145)
TCO2: 27 mmol/L (ref 22–32)

## 2022-06-17 MED ORDER — ACETAMINOPHEN 500 MG PO TABS
1000.0000 mg | ORAL_TABLET | Freq: Four times a day (QID) | ORAL | Status: DC | PRN
  Administered 2022-06-17: 1000 mg via ORAL
  Filled 2022-06-17: qty 2

## 2022-06-17 NOTE — Progress Notes (Signed)
Orthopedic Tech Progress Note Patient Details:  Fernando Hess DALL May 07, 1946 [id number]  Patient ID: Fernando Hess, male   DOB: November 07, 1945, 76 y.o.   MRN: [id number] I called order into [facility]anger Karolee Stamps 06/17/2022, 6:58 AM

## 2022-06-17 NOTE — ED Provider Notes (Signed)
[facility] EMERGENCY DEPARTMENT Provider Note   CSN: [id number] Arrival date & time: 06/17/22  0254     History  Chief Complaint  Patient presents with   Level 2 ; Fall on Anticoagulant    Fernando Hess is a 76 y.o. male.  HPI     This is a 76 year old male who presents as a level 2 trauma.  Patient reports that he was washing dishes around 12 AM when he fell striking his head.  He is on Eliquis.  He is unsure whether he passed out or not.  He states right now he is having a headache.  He at baseline has some shortness of breath which he relates to his myasthenia gravis.  This is not unchanged from his baseline.  He is not having any active chest pain.  Denies vision changes, weakness, numbness, tingling, strokelike symptoms.  He states that when he fell he hit his bilateral knees.  He has been ambulatory since the fall.  Home Medications Prior to Admission medications   Medication Sig Start Date End Date Taking? Authorizing Provider  acetaminophen (TYLENOL) 325 MG tablet Take 325 mg by mouth every 6 (six) hours as needed for headache (pain).    [provider]  acetaminophen-codeine (TYLENOL #3) 300-30 MG tablet Take 1 tablet by mouth every 6 (six) hours as needed.    [provider]  ALPRAZolam Duanne Moron) 0.25 MG tablet Take for anxiety 1 hour before PFT 10/22/21   Deneise Lever, MD  apixaban (ELIQUIS) 5 MG TABS tablet TAKE ONE TABLET BY MOUTH EVERY MORNING and TAKE ONE TABLET BY MOUTH EVERY EVENING 03/13/22   Freada Bergeron, MD  atorvastatin (LIPITOR) 80 MG tablet Take 1 tablet (80 mg total) by mouth daily. 11/07/21 02/05/22  [name], [name]  B-D UF III MINI PEN NEEDLES 31G X 5 MM MISC USE AS DIRECTED WITH LANTUS SOLOSTAR 04/02/19   [provider]  busPIRone (BUSPAR) 10 MG tablet Take 1 tablet by mouth daily.    [provider]  cilostazol (PLETAL) 50 MG tablet Take 1 tablet by mouth 2 (two) times daily.    [provider]  Continuous Blood Gluc Sensor (FREESTYLE LIBRE 2 SENSOR) MISC replace sensor every TWO WEEKS 11/29/21   [provider]  DULoxetine (CYMBALTA) 30 MG capsule Take 30 mg by mouth every morning. Take with a 60 mg capsule for a total morning dose of 90 mg    [provider]  DULoxetine (CYMBALTA) 60 MG capsule Take 60 mg by mouth every morning. Take with a 30 mg capsule for a total morning dose of 90 mg 04/12/18   [provider]  fluticasone (FLONASE) 50 MCG/ACT nasal spray Instill 2 puffs each nostril every night.    [provider]  furosemide (LASIX) 20 MG tablet Take 20 mg by mouth daily.    [provider]  glimepiride (AMARYL) 4 MG tablet Take 1 tablet by mouth 2 (two) times daily.    [provider]  guaiFENesin-codeine (ROBITUSSIN AC) 100-10 MG/5ML syrup take 51m BY MOUTH EVERY 4 HOURS AS NEEDED 06/10/22   [name], MD  HYDROcodone-acetaminophen (NORCO/VICODIN) 5-325 MG tablet Take 1 tablet by mouth every 4 (four) hours as needed. 10/08/21   [provider]  hydrOXYzine (VISTARIL) 25 MG capsule Take 25 mg by mouth at bedtime as needed for itching (sleep). 01/04/21   [provider]  ibuprofen (ADVIL) 600 MG tablet  [provider]  Insulin Glargine (BASAGLAR KWIKPEN) 100 UNIT/ML SOPN Inject 40 Units into the skin daily.    [provider]  insulin lispro (HUMALOG) 100 UNIT/ML KwikPen Inject 14 Units into the skin 3 (three) times daily.    [provider]  MAGNESIUM PO Take 350 mg by mouth every evening.    [provider]  Melatonin 3 MG TABS Take 3 mg by mouth at bedtime.    [provider]  metoprolol succinate (TOPROL-XL) 25 MG 24 hr tablet Take 0.5 tablets by mouth every morning.    [provider]  Multiple Vitamin (MULTIVITAMIN WITH MINERALS) TABS tablet Take 1 tablet by mouth every morning.    [provider]  nitroGLYCERIN  (NITROSTAT) 0.4 MG SL tablet Place 0.4 mg under the tongue every 5 (five) minutes as needed for chest pain.    [provider]  ondansetron (ZOFRAN-ODT) 4 MG disintegrating tablet Take 4 mg by mouth every 8 (eight) hours as needed for nausea or vomiting.    [provider]  pantoprazole (PROTONIX) 40 MG tablet TAKE 1 TABLET BY MOUTH EVERY DAY 11/25/19   [name], MD  pantoprazole (PROTONIX) 40 MG tablet Take 1 tablet by mouth daily.    [provider]  pioglitazone (ACTOS) 45 MG tablet Take 1 tablet by mouth daily.    [provider]  promethazine (PHENERGAN) 25 MG tablet TAKE 1 TABLET BY MOUTH EVERY 6 HOURS AS NEEDED FOR NAUSEA FOR 10 DAYS    [provider]  pyridostigmine (MESTINON) 60 MG tablet 1 tablet    [provider]  pyridostigmine (MESTINON) 60 MG tablet Take by mouth. 03/04/22   [provider]  valsartan (DIOVAN) 160 MG tablet Take 1 tablet by mouth daily.    [provider]  zaleplon ([name]) 5 MG capsule TAKE ONE CAPSULE BY MOUTH everyday AT bedtime 04/16/22   Baird Lyons D, MD      Allergies    Cilostazol, Dulaglutide, Levofloxacin, Liraglutide, and Lisinopril    Review of Systems   Review of Systems  Constitutional:  Negative for fever.  Respiratory:  Positive for shortness of breath.   Neurological:  Positive for headaches.  All other systems reviewed and are negative.   Physical Exam Updated Vital Signs BP (!) 164/99   Pulse 87   Temp 97.8 F (36.6 C) (Temporal)   Resp 20   Ht 1.676 m ('5\' 6"'$ )   Wt 80 kg   SpO2 98%   BMI 28.47 kg/m  Physical Exam Vitals and nursing note reviewed.  Constitutional:      Appearance: He is well-developed.     Comments: Chronically ill-appearing but nontoxic  HENT:     Head: Normocephalic.     Comments: Hematoma left scalp Eyes:     Extraocular Movements: Extraocular movements intact.     Pupils: Pupils are equal, round, and reactive to light.   Cardiovascular:     Rate and Rhythm: Normal rate and regular rhythm.     Heart sounds: Normal heart sounds. No murmur heard.    Comments: Midline sternotomy scar well-healing Pulmonary:     Effort: Pulmonary effort is normal. No respiratory distress.     Breath sounds: Normal breath sounds. No wheezing.  Abdominal:     Palpations: Abdomen is soft.     Tenderness: There is no abdominal tenderness. There is no rebound.  Musculoskeletal:     Cervical back: Neck supple.     Comments: Contusion  bilateral knees, normal range of motion  Lymphadenopathy:     Cervical: No cervical adenopathy.  Skin:    General: Skin is warm and dry.  Neurological:     Mental Status: He is alert and oriented to person, place, and time.     Comments: Cranial nerves II through XII intact, 5 out of 5 strength in all 4 extremities  Psychiatric:        Mood and Affect: Mood normal.     ED Results / Procedures / Treatments   Labs (all labs ordered are listed, but only abnormal results are displayed) Labs Reviewed  CBC WITH DIFFERENTIAL/PLATELET - Abnormal; Notable for the following components:      Result Value   WBC 14.6 (*)    Neutro Abs 11.8 (*)    Abs Immature Granulocytes 0.08 (*)    All other components within normal limits  BASIC METABOLIC PANEL - Abnormal; Notable for the following components:   Glucose, Bld 200 (*)    BUN 33 (*)    Creatinine, Ser 2.27 (*)    GFR, Estimated 29 (*)    All other components within normal limits  I-STAT CHEM 8, ED - Abnormal; Notable for the following components:   BUN 35 (*)    Creatinine, Ser 2.20 (*)    Glucose, Bld 196 (*)    Calcium, Ion 1.14 (*)    All other components within normal limits    EKG EKG Interpretation  Date/Time:  Tuesday June 17 2022 03:21:01 EDT Ventricular Rate:  93 PR Interval:  132 QRS Duration: 120 QT Interval:  384 QTC Calculation: 478 R Axis:   13 Text Interpretation: Sinus rhythm IRBBB and LPFB Confirmed by Thayer Jew [phone]) on 06/17/2022 4:02:08 AM  Radiology CT Head Wo Contrast  Result Date: 06/17/2022 CLINICAL DATA:  Trauma/fall, on Eliquis EXAM: CT HEAD WITHOUT CONTRAST CT CERVICAL SPINE WITHOUT CONTRAST TECHNIQUE: Multidetector CT imaging of the head and cervical spine was performed following the standard protocol without intravenous contrast. Multiplanar CT image reconstructions of the cervical spine were also generated. RADIATION DOSE REDUCTION: This exam was performed according to the departmental dose-optimization program which includes automated exposure control, adjustment of the mA and/or kV according to patient size and/or use of iterative reconstruction technique. COMPARISON:  CT head dated 05/10/2021 FINDINGS: CT HEAD FINDINGS Brain: No evidence of acute infarction, hemorrhage, hydrocephalus, extra-axial collection or mass lesion/mass effect. Global cortical and central atrophy. Subcortical white matter and periventricular small vessel ischemic changes. Vascular: Intracranial atherosclerosis. Skull: Normal. Negative for fracture or focal lesion. Sinuses/Orbits: The visualized paranasal sinuses are essentially clear. The mastoid air cells are unopacified. Other: None. CT CERVICAL SPINE FINDINGS Alignment: 3 mm anterolisthesis of C3 on C4. Otherwise normal cervical lordosis. Skull base and vertebrae: No acute fracture. No primary bone lesion or focal pathologic process. Soft tissues and spinal canal: No prevertebral fluid or swelling. No visible canal hematoma. Disc levels: Mild degenerative changes of the mid cervical spine. Spinal canal is patent. Upper chest: Visualized lung apices are clear. Other: Visualized thyroid is unremarkable. IMPRESSION: No evidence of acute intracranial abnormality. Atrophy with small vessel ischemic changes. No evidence of traumatic injury to the cervical spine. Mild degenerative changes. Electronically Signed   By: Julian Hy M.D.   On: 06/17/2022 03:29   CT  Cervical Spine Wo Contrast  Result Date: 06/17/2022 CLINICAL DATA:  Trauma/fall, on Eliquis EXAM: CT HEAD WITHOUT CONTRAST CT CERVICAL SPINE WITHOUT CONTRAST TECHNIQUE: Multidetector CT imaging of the head  and cervical spine was performed following the standard protocol without intravenous contrast. Multiplanar CT image reconstructions of the cervical spine were also generated. RADIATION DOSE REDUCTION: This exam was performed according to the departmental dose-optimization program which includes automated exposure control, adjustment of the mA and/or kV according to patient size and/or use of iterative reconstruction technique. COMPARISON:  CT head dated 05/10/2021 FINDINGS: CT HEAD FINDINGS Brain: No evidence of acute infarction, hemorrhage, hydrocephalus, extra-axial collection or mass lesion/mass effect. Global cortical and central atrophy. Subcortical white matter and periventricular small vessel ischemic changes. Vascular: Intracranial atherosclerosis. Skull: Normal. Negative for fracture or focal lesion. Sinuses/Orbits: The visualized paranasal sinuses are essentially clear. The mastoid air cells are unopacified. Other: None. CT CERVICAL SPINE FINDINGS Alignment: 3 mm anterolisthesis of C3 on C4. Otherwise normal cervical lordosis. Skull base and vertebrae: No acute fracture. No primary bone lesion or focal pathologic process. Soft tissues and spinal canal: No prevertebral fluid or swelling. No visible canal hematoma. Disc levels: Mild degenerative changes of the mid cervical spine. Spinal canal is patent. Upper chest: Visualized lung apices are clear. Other: Visualized thyroid is unremarkable. IMPRESSION: No evidence of acute intracranial abnormality. Atrophy with small vessel ischemic changes. No evidence of traumatic injury to the cervical spine. Mild degenerative changes. Electronically Signed   By: Julian Hy M.D.   On: 06/17/2022 03:29   DG Chest Portable 1 View  Result Date:  06/17/2022 CLINICAL DATA:  Shortness of breath EXAM: PORTABLE CHEST 1 VIEW COMPARISON:  07/05/2021 FINDINGS: Cardiac shadow is within normal limits. Postsurgical changes are noted. Lungs are clear bilaterally. No bony abnormality is noted. IMPRESSION: No active disease. Electronically Signed   By: Inez Catalina M.D.   On: 06/17/2022 03:26   DG Knee 2 Views Left  Result Date: 06/17/2022 CLINICAL DATA:  Recent fall left knee pain, initial encounter EXAM: LEFT KNEE - 2 VIEW COMPARISON:  None Available. FINDINGS: No evidence of fracture, dislocation, or joint effusion. No evidence of arthropathy or other focal bone abnormality. Soft tissues are unremarkable. IMPRESSION: No acute abnormality noted. Electronically Signed   By: Inez Catalina M.D.   On: 06/17/2022 03:20   DG Knee 2 Views Right  Result Date: 06/17/2022 CLINICAL DATA:  Recent fall with right knee pain, initial encounter, initial encounter EXAM: RIGHT KNEE - 2 VIEW COMPARISON:  None Available. FINDINGS: No acute fracture or dislocation is noted. Postsurgical changes are noted along the medial aspect of the calf. No joint effusion is seen. No other focal abnormality is noted. IMPRESSION: No acute abnormality noted. Electronically Signed   By: Inez Catalina M.D.   On: 06/17/2022 03:20    Procedures Procedures    Medications Ordered in ED Medications  acetaminophen (TYLENOL) tablet 1,000 mg (has no administration in time range)    ED Course/ Medical Decision Making/ A&P                           Medical Decision Making Amount and/or Complexity of Data Reviewed Labs: ordered. Radiology: ordered.  Risk OTC drugs.   This patient presents to the ED for concern of fall, this involves an extensive number of treatment options, and is a complaint that carries with it a high risk of complications and morbidity.  I considered the following differential and admission for this acute, potentially life threatening condition.  The differential  diagnosis includes contusion, head bleed, acute fracture  MDM:    This is a 76 year old male who  presents following a fall.  He is nontoxic and vital signs are notable for initial blood pressure of 180/100.  This improved while he was here.  He has evidence of a hematoma to the left scalp.  He is on blood thinners.  He has contusions to the bilateral knees.  EKG shows no evidence of obvious arrhythmia.  CT head and neck are reassuring and without evidence of obvious bleed.  X-rays of the bilateral knees are negative.  Patient is unclear whether he lost consciousness.  However, he has had no evidence of arrhythmia on the monitor and is neurologically intact.  Other lab work is notable for mild hyperglycemia.  He is creatinine is 2.27.  Baseline is around 2.  Patient is ambulatory independently.  Discussed findings with the patient.  Gave him recommendations for pain control at home.  (Labs, imaging, consults)  Labs: I Ordered, and personally interpreted labs.  The pertinent results include: CBC, BMP,  Imaging Studies ordered: I ordered imaging studies including CT head neck, bilateral knee films I independently visualized and interpreted imaging. I agree with the radiologist interpretation  Additional history obtained from chart review.  External records from outside source obtained and reviewed including evaluations  Cardiac Monitoring: The patient was maintained on a cardiac monitor.  I personally viewed and interpreted the cardiac monitored which showed an underlying rhythm of: Sinus rhythm  Reevaluation: After the interventions noted above, I reevaluated the patient and found that they have :improved  Social Determinants of Health: Lives independently  Disposition: Discharge  Co morbidities that complicate the patient evaluation  Past Medical History:  Diagnosis Date   Acute renal failure (ARF) ([place]) 09/24/2015   Atrial premature complexes    CAD (coronary artery disease) of  artery bypass graft    Early occlusion of saphenous vein graft to intermediate and marginal branch in February 2007 following bypass grafting    CAD (coronary artery disease), native coronary artery 2017   hx NSTEMI 09-24-2015  s/p  CABG x5 on 10-02-2015;  post op STEMI inferolateral wall,  SVG OM1 and SVG OM2 occluded, distal OM occlusion the calpruit, treated medically // Myoview 7/21: no ischemia, EF 65, low risk   CHF (congestive heart failure) ([name])    CKD (chronic kidney disease), stage III ([name])    Dyspnea    Elevated troponin    Erectile dysfunction    Esophageal reflux    History of atrial fibrillation    post op CABG 10-02-2015   History of kidney stones    History of non-ST elevation myocardial infarction (NSTEMI) 09/24/2015   s/p  CABG x5   History of ST elevation myocardial infarction (STEMI) 10/22/2015   inferior wall,  post op CABG 10-02-2015   Hyperlipidemia    Hypertension    Left ureteral stone    Mild atherosclerosis of both carotid arteries    Nephrolithiasis    per CT bilateral non-obstructive calculi   OSA (obstructive sleep apnea)    Peripheral artery disease    LE Arterial US 01/2019: R PTA and ATA occluded; L ATA occluded   RBBB (right bundle branch block)    Renal atrophy, right    Sleep apnea    wears cpap    ST elevation myocardial infarction (STEMI) of inferior wall ([place]) 10/22/2015   Type 2 diabetes mellitus treated with insulin ([place])    followed by pcp   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 ([place]) 03/01/2008  Wears glasses      Medicines Meds ordered this encounter  Medications   acetaminophen (TYLENOL) tablet 1,000 mg    I have reviewed the patients home medicines and have made adjustments as needed  Problem List / ED Course: Problem List Items Addressed This Visit   None Visit Diagnoses     Hematoma of scalp, initial encounter    -  Primary   Contusion of knee,  unspecified laterality, initial encounter                       Final Clinical Impression(s) / ED Diagnoses Final diagnoses:  Hematoma of scalp, initial encounter  Contusion of knee, unspecified laterality, initial encounter    Rx / DC Orders ED Discharge Orders     None         Merryl Hacker, MD 06/17/22 [phone]

## 2022-06-17 NOTE — Discharge Instructions (Signed)
You were seen today after a fall.  Your work-up today is reassuring.  Follow-up closely with your primary physician.  Apply ice to your scalp.  If you have any new or worsening symptoms, you should be reevaluated.  Your x-rays are also negative.

## 2022-06-17 NOTE — ED Notes (Signed)
Patient ambulated well in the hall. No complaints or deficits reported. Patient is back in bed resting currently.

## 2022-06-17 NOTE — ED Notes (Signed)
Patient transported to CT scan . 

## 2022-06-17 NOTE — ED Notes (Signed)
Trauma Response Nurse Documentation   Fernando Hess is a 76 y.o. male arriving to [facility] ED via EMS  On Eliquis (apixaban) daily. Trauma was activated as a Level 2 by ED charge RN based on the following trauma criteria Fernando Hess > 65 with head trauma on anti-coagulation (excluding ASA). Trauma team at the bedside on patient arrival.   Patient cleared for CT by Dr. Dina Rich EDP. Pt transported to CT with trauma response nurse present to monitor. RN remained with the patient throughout their absence from the department for clinical observation.   GCS 15.  History   Past Medical History:  Diagnosis Date   Acute renal failure (ARF) ([place]) 09/24/2015   Atrial premature complexes    CAD (coronary artery disease) of artery bypass graft    Early occlusion of saphenous vein graft to intermediate and marginal branch in February 2007 following bypass grafting    CAD (coronary artery disease), native coronary artery 2017   hx NSTEMI 09-24-2015  s/p  CABG x5 on 10-02-2015;  post op STEMI inferolateral wall,  SVG OM1 and SVG OM2 occluded, distal OM occlusion the calpruit, treated medically // Myoview 7/21: no ischemia, EF 65, low risk   CHF (congestive heart failure) ([name])    CKD (chronic kidney disease), stage III ([name])    Dyspnea    Elevated troponin    Erectile dysfunction    Esophageal reflux    History of atrial fibrillation    post op CABG 10-02-2015   History of kidney stones    History of non-ST elevation myocardial infarction (NSTEMI) 09/24/2015   s/p  CABG x5   History of ST elevation myocardial infarction (STEMI) 10/22/2015   inferior wall,  post op CABG 10-02-2015   Hyperlipidemia    Hypertension    Left ureteral stone    Mild atherosclerosis of both carotid arteries    Nephrolithiasis    per CT bilateral non-obstructive calculi   OSA (obstructive sleep apnea)    Peripheral artery disease    LE Arterial US 01/2019: R PTA and ATA occluded; L ATA occluded   RBBB (right  bundle branch block)    Renal atrophy, right    Sleep apnea    wears cpap    ST elevation myocardial infarction (STEMI) of inferior wall ([place]) 10/22/2015   Type 2 diabetes mellitus treated with insulin ([place])    followed by pcp   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 ([place]) 03/01/2008   Wears glasses      Past Surgical History:  Procedure Laterality Date   [place] N/A 09/26/2015   Procedure: Left Heart Cath and Coronary Angiography;  Surgeon: Troy Sine, MD;  Location: [facility] [facility];  Service: Cardiovascular;  Laterality: N/A;   CARDIAC CATHETERIZATION N/A 10/22/2015   Procedure: Left Heart Cath and Coronary Angiography;  Surgeon: Sherren Mocha, MD;  Location: [facility];  Service: Cardiovascular;  Laterality: N/A;   CATARACT EXTRACTION W/ INTRAOCULAR LENS  IMPLANT, BILATERAL  2017   COLONOSCOPY     CORONARY ARTERY BYPASS GRAFT N/A 10/02/2015   Procedure: CORONARY ARTERY BYPASS GRAFTING (CABG) X5 LIMA-LAD; SVG-DIAG; SVG-OM; SVG-PD; SVG-RAMUS TRANSESOPHAGEAL ECHOCARDIOGRAM (TEE) ENDOSCOPIC GREATER SAPHENOUS VEIN  HARVEST BILAT LE;  Surgeon: Ivin Poot, MD;  Location: [facility];  Service: Open Heart Surgery;  Laterality: N/A;   CYSTOSCOPY/URETEROSCOPY/HOLMIUM LASER/STENT PLACEMENT Left 08/10/2018   Procedure: CYSTOSCOPY/URETEROSCOPY/HOLMIUM LASER/STENT PLACEMENT;  Surgeon: Festus Aloe, MD;  [facility];  Service: Urology;  Laterality: Left;   CYSTOSCOPY/URETEROSCOPY/HOLMIUM LASER/STENT PLACEMENT Left 09/10/2018   Procedure: CYSTOSCOPY/URETEROSCOPY/HOLMIUM LASER/STENT EXCHANGE;  Surgeon: Festus Aloe, MD;  Location: [facility];  Service: Urology;  Laterality: Left;   LEFT HEART CATHETERIZATION WITH CORONARY ANGIOGRAM N/A 04/13/2014   Procedure: LEFT HEART CATHETERIZATION WITH CORONARY ANGIOGRAM;  Surgeon: Jacolyn Reedy, MD;  Location: [facility]  CATH LAB;  Service: Cardiovascular;  Laterality: N/A;   LEG SURGERY Right age 19   closed reduction leg fracture   NASAL SEPTOPLASTY W/ TURBINOPLASTY Bilateral 08/30/2021   Procedure: NASAL SEPTOPLASTY WITH BILATERAL INFERIOR TURBINATE REDUCTION;  Surgeon: Jerrell Belfast, MD;  Location: [place] [facility];  Service: ENT;  Laterality: Bilateral;   POLYPECTOMY     RIGHT HEART CATH N/A 06/06/2021   Procedure: RIGHT HEART CATH;  Surgeon: Jolaine Artist, MD;  Location: [place] [facility];  Service: Cardiovascular;  Laterality: N/A;   TEE WITHOUT CARDIOVERSION N/A 10/02/2015   Procedure: TRANSESOPHAGEAL ECHOCARDIOGRAM (TEE);  Surgeon: Ivin Poot, MD;  Location: [place];  Service: Open Heart Surgery;  Laterality: N/A;   URETEROSCOPY WITH HOLMIUM LASER LITHOTRIPSY Bilateral 2004;  2005  dr Risa Grill  '@[facility]'$    VASECTOMY         Initial Focused Assessment (If applicable, or please see trauma documentation): Alert/oriented male presents after a fall after a dizzy spell, +LOC and anterior head abrasion, bilateral knee pain. Tylenol PTA effective for pain Airway patent/unobstructed, BS clear No obvious uncontrolled hemorrhage GCS15 PERRLA  CT's Completed:   CT Head and CT C-Spine   Interventions:  CTs as above Portable chest and bilateral knees XRAYs Trauma lab draw EKG  Plan for disposition:  Pending workup  Consults completed:  none at the time of this note.  Event Summary: Patient arrives via EMS for a fall, states he got dizzy and fell, hitting his head. Takes Eliquis. Reports headache 12/10, took tylenol PTA with improvement. Reports bilateral knee pain, redness noted. Portable XRAYs completed, escorted to CT by TRN on cardiac monitor. Dispo pending workup.  MTP Summary (If applicable): NA  Bedside handoff with ED RN Mortimer Fries.    Fernando Hess O Fernando Hess  Trauma Response RN  Please call TRN at [phone] for further assistance.

## 2022-06-17 NOTE — ED Triage Notes (Signed)
Patient arrived with EMS from home as a level 2 trauma activation , felt dizzy and fell at home this morning , hit his head against the floor with brief LOC. He takes Eliquis . Reports headache and bilateral knee pain .

## 2022-06-27 ENCOUNTER — Ambulatory Visit (INDEPENDENT_AMBULATORY_CARE_PROVIDER_SITE_OTHER): Payer: Medicare Other | Admitting: Podiatry

## 2022-06-27 ENCOUNTER — Encounter: Payer: Self-pay | Admitting: Podiatry

## 2022-06-27 DIAGNOSIS — B351 Tinea unguium: Secondary | ICD-10-CM | POA: Diagnosis not present

## 2022-06-27 DIAGNOSIS — M79675 Pain in left toe(s): Secondary | ICD-10-CM

## 2022-06-27 DIAGNOSIS — M79674 Pain in right toe(s): Secondary | ICD-10-CM | POA: Diagnosis not present

## 2022-06-27 DIAGNOSIS — E1151 Type 2 diabetes mellitus with diabetic peripheral angiopathy without gangrene: Secondary | ICD-10-CM

## 2022-06-27 NOTE — Progress Notes (Unsigned)
  Subjective:  Patient ID: Fernando Hess, male    DOB: 1945-10-30,  MRN: [id number]  Fernando Hess presents to clinic today for:  Chief Complaint  Patient presents with   Nail Problem    Diabetic foot care BS-261 A1C-5.? PCP-Alan Ross PCP VST- Couple months ago   New problem(s): None.   PCP is [name], MD.  Allergies  Allergen Reactions   Cilostazol Swelling    edema Other reaction(s): edema Other reaction(s): edema   Dulaglutide Nausea And Vomiting    Other reaction(s): vomiting Other reaction(s): vomiting   Levofloxacin Itching, Rash and Hives    Other reaction(s): hives   Liraglutide Other (See Comments)    Severe fatigue & insomnia Other reaction(s): severe fatigue and insomnia   Lisinopril Itching, Rash and Cough    Other reaction(s): cough    Review of Systems: Negative except as noted in the HPI.  Objective: No changes noted in today's physical examination.  Fernando Hess is a pleasant 76 y.o. male WD, WN in NAD. AAO x 3.  Vascular Examination: CFT <4 seconds b/l LE. Faintly palpable DP pulses b/l LE. Faintly palpable PT pulse(s) b/l LE. Pedal hair absent. No pain with calf compression b/l. Lower extremity skin temperature gradient warm to cool. Trace edema noted BLE. No ischemia or gangrene noted b/l LE. No cyanosis or clubbing noted b/l LE.  Dermatological Examination: Pedal skin thin, shiny and atrophic b/l LE. No open wounds b/l LE. No interdigital macerations noted b/l LE. Toenails 1-5 b/l elongated, discolored, dystrophic, thickened, crumbly with subungual debris and tenderness to dorsal palpation. No hyperkeratotic nor porokeratotic lesions present on today's visit.  Musculoskeletal Examination: Muscle strength 5/5 to all lower extremity muscle groups bilaterally. HAV with bunion deformity noted b/l LE. Hammertoe deformity noted 2-5 b/l.  Neurological Examination: Protective sensation intact 5/5 intact bilaterally with 10g monofilament  b/l. Vibratory sensation decreased b/l.  Assessment/Plan: 1. Pain due to onychomycosis of toenails of both feet   2. Type II diabetes mellitus with peripheral circulatory disorder ([name])     No orders of the defined types were placed in this encounter.   -Patient was evaluated and treated. All patient's and/or POA's questions/concerns answered on today's visit. -No new findings. No new orders. -Continue foot and shoe inspections daily. Monitor blood glucose per PCP/Endocrinologist's recommendations. -Mycotic toenails 1-5 bilaterally were debrided in length and girth with sterile nail nippers and dremel without incident. -Patient/POA to call should there be question/concern in the interim.   Return in about 3 months (around 09/27/2022).  Marzetta Board, DPM

## 2022-07-16 NOTE — Progress Notes (Unsigned)
Cardiology Office Note:    Date:  07/16/2022   ID:  Fernando Hess, DOB [birth year], MRN [id number]  PCP:  [name], MD   [facility] Cardiologist:  Freada Bergeron, MD Cardiology APP:  Liliane Shi, [name]  Electrophysiologist:  Vickie Epley, MD  {  Referring MD: [name], MD    History of Present Illness:    Fernando Hess is a 76 y.o. male with a hx of CAD s/p CABG in 2017, HFpEF, pAfib on apixaban, mild AS, DMII, HTN, HLD, CKD IV, OSA, and PAD who was previously followed by Dr. Meda Coffee who now presents to clinic for follow-up.   Per review of the record, the patient suffered a myocardial infarction in January 2017 and underwent multivessel CABG.  Postoperative course was complicated by atrial fibrillation treated with amiodarone.  He presented back to the hospital in 10/2015 with an inferolateral ST elevation myocardial infarction.  He had significant hyperkalemia and nausea and vomiting. Amiodarone was stopped secondary to nausea and vomiting.  Cardiac catheterization demonstrated an occluded SVG-OM1 and occluded SVG-OM2.  His distal OM was occluded.  The distal OM occlusion was felt to be the culprit for his myocardial infarction.  He had diabetic appearing vessels and medical therapy was recommended.   The patient has been struggling with episode of falls as well as shortness of breath.  With regards to shortness of breath he tries to walk a mile every day and has to stop several times. He underwent nuclear stress test 03/2020  that showed no evidence of prior infarct or ischemia.   With regards to dizziness he says that it has improved and he has not had any falls since he started to use a cane.  He saw Dr. 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 [name] recommended a loop monitor however patient was not interested at that time.  He was admitted 05/10/21-05/12/21 with episodes of syncope and  shortness of breath.  TTE demonstrated normal EF.  His BNP was mildly elevated and he was diuresed with IV furosemide.  PRN furosemide was recommended at DC.  His beta-blocker was DC'd.  He had orthostatic hypotension noted.  He saw Dr. [name] after DC and was set up for RHC due to ongoing shortness of breath and weakness.  RHC showed low filling pressures.  He was asked to reduce furosemide use (he had been taking it daily prior to [place]) to 3 times a week.  An OP event monitor showed frequent asymptomatic PACs and short runs of Supraventricular Tachycardia (no AF or pauses).  Saw [name] on 06/26/21 where he continued to have SOB and was being seen by Pulmonary. Appeared compensated from volume standpoint. BNP was elevated at 627.  Was seen as pre-op visit prior to ENT surgery in 07/2021 where myoview 08/16/21 was normal with EF 55%.   Saw [name] on 10/2020 where he continued to have SOB. He underwent CPET which was limited due to submaximal effort during exercise, however, based on available data, exercise testing with gas exchange demonstrated normal functional capacity. No clear cardiopulm abnormality.   Was last seen in clinic on 01/22/22 where he continued to feel SOB but was otherwise stable.   Today, ***   Past Medical History:  Diagnosis Date   Acute renal failure (ARF) ([place]) 09/24/2015   Atrial premature complexes    CAD (coronary artery disease) of artery bypass graft    Early occlusion  of saphenous vein graft to intermediate and marginal branch in February 2007 following bypass grafting    CAD (coronary artery disease), native coronary artery 2017   hx NSTEMI 09-24-2015  s/p  CABG x5 on 10-02-2015;  post op STEMI inferolateral wall,  SVG OM1 and SVG OM2 occluded, distal OM occlusion the calpruit, treated medically // Myoview 7/21: no ischemia, EF 65, low risk   CHF (congestive heart failure) ([name])    CKD (chronic kidney disease), stage III ([name])    Dyspnea     Elevated troponin    Erectile dysfunction    Esophageal reflux    History of atrial fibrillation    post op CABG 10-02-2015   History of kidney stones    History of non-ST elevation myocardial infarction (NSTEMI) 09/24/2015   s/p  CABG x5   History of ST elevation myocardial infarction (STEMI) 10/22/2015   inferior wall,  post op CABG 10-02-2015   Hyperlipidemia    Hypertension    Left ureteral stone    Mild atherosclerosis of both carotid arteries    Nephrolithiasis    per CT bilateral non-obstructive calculi   OSA (obstructive sleep apnea)    Peripheral artery disease    LE Arterial US 01/2019: R PTA and ATA occluded; L ATA occluded   RBBB (right bundle branch block)    Renal atrophy, right    Sleep apnea    wears cpap    ST elevation myocardial infarction (STEMI) of inferior wall ([place]) 10/22/2015   Type 2 diabetes mellitus treated with insulin ([place])    followed by pcp   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 ([place]) 03/01/2008   Wears glasses     Past Surgical History:  Procedure Laterality Date   [place] N/A 09/26/2015   Procedure: Left Heart Cath and Coronary Angiography;  Surgeon: Troy Sine, MD;  Location: [facility];  Service: Cardiovascular;  Laterality: N/A;   CARDIAC CATHETERIZATION N/A 10/22/2015   Procedure: Left Heart Cath and Coronary Angiography;  Surgeon: Sherren Mocha, MD;  Location: [facility];  Service: Cardiovascular;  Laterality: N/A;   CATARACT EXTRACTION W/ INTRAOCULAR LENS  IMPLANT, BILATERAL  2017   COLONOSCOPY     CORONARY ARTERY BYPASS GRAFT N/A 10/02/2015   Procedure: CORONARY ARTERY BYPASS GRAFTING (CABG) X5 LIMA-LAD; SVG-DIAG; SVG-OM; SVG-PD; SVG-RAMUS TRANSESOPHAGEAL ECHOCARDIOGRAM (TEE) ENDOSCOPIC GREATER SAPHENOUS VEIN  HARVEST BILAT LE;  Surgeon: Ivin Poot, MD;  Location: [facility];  Service: Open Heart Surgery;   Laterality: N/A;   CYSTOSCOPY/URETEROSCOPY/HOLMIUM LASER/STENT PLACEMENT Left 08/10/2018   Procedure: CYSTOSCOPY/URETEROSCOPY/HOLMIUM LASER/STENT PLACEMENT;  Surgeon: Festus Aloe, MD;  [facility];  Service: Urology;  Laterality: Left;   CYSTOSCOPY/URETEROSCOPY/HOLMIUM LASER/STENT PLACEMENT Left 09/10/2018   Procedure: CYSTOSCOPY/URETEROSCOPY/HOLMIUM LASER/STENT EXCHANGE;  Surgeon: Festus Aloe, MD;  Location: [facility];  Service: Urology;  Laterality: Left;   LEFT HEART CATHETERIZATION WITH CORONARY ANGIOGRAM N/A 04/13/2014   Procedure: LEFT HEART CATHETERIZATION WITH CORONARY ANGIOGRAM;  Surgeon: Jacolyn Reedy, MD;  Location: [facility] C[facility];  Service: Cardiovascular;  Laterality: N/A;   LEG SURGERY Right age 40   closed reduction leg fracture   NASAL SEPTOPLASTY W/ TURBINOPLASTY Bilateral 08/30/2021   Procedure: NASAL SEPTOPLASTY WITH BILATERAL INFERIOR TURBINATE REDUCTION;  Surgeon: Jerrell Belfast, MD;  Location: [facility];  Service: ENT;  Laterality: Bilateral;   POLYPECTOMY     RIGHT HEART CATH N/A 06/06/2021  Procedure: RIGHT HEART CATH;  Surgeon: Jolaine Artist, MD;  Location: [facility];  Service: Cardiovascular;  Laterality: N/A;   TEE WITHOUT CARDIOVERSION N/A 10/02/2015   Procedure: TRANSESOPHAGEAL ECHOCARDIOGRAM (TEE);  Surgeon: Ivin Poot, MD;  Location: [facility];  Service: Open Heart Surgery;  Laterality: N/A;   URETEROSCOPY WITH HOLMIUM LASER LITHOTRIPSY Bilateral 2004;  2005  dr Risa Grill  '@[facility]'$    VASECTOMY      Current Medications: No outpatient medications have been marked as taking for the 07/17/22 encounter (Appointment) with Freada Bergeron, MD.   Current Facility-Administered Medications for the 07/17/22 encounter (Appointment) with Freada Bergeron, MD  Medication   sodium chloride flush (NS) 0.9 % injection 3 mL     Allergies:   Cilostazol, Dulaglutide, Levofloxacin, Liraglutide, and Lisinopril   Social History    Socioeconomic History   Marital status: Married    Spouse name: Not on file   Number of children: Not on file   Years of education: Not on file   Highest education level: Not on file  Occupational History   Occupation: retired  Tobacco Use   Smoking status: Never   Smokeless tobacco: Never  Vaping Use   Vaping Use: Never used  Substance and Sexual Activity   Alcohol use: Not Currently   Drug use: Never   Sexual activity: Not on file  Other Topics Concern   Not on file  Social History Narrative   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 There is no history of Colon cancer, Esophageal cancer, Prostate cancer, Rectal cancer, Stomach cancer, or Colon polyps.  ROS:   Please see the history of present illness.    Review of Systems  Constitutional:  Negative for chills and fever.  HENT:  Negative for hearing loss and tinnitus.   Eyes:  Negative for blurred vision and double vision.  Respiratory:  Positive for shortness of breath. Negative for cough and hemoptysis.   Cardiovascular:  Positive for palpitations. Negative for chest pain, orthopnea, claudication, leg swelling and PND.  Gastrointestinal:  Negative for heartburn and nausea.  Genitourinary:  Negative for dysuria and urgency.  Musculoskeletal:  Negative for myalgias and neck pain.  Skin:  Negative for itching and rash.  Neurological:  Positive for tingling (Right hand and forearm). Negative for dizziness and headaches.  Endo/Heme/Allergies:  Negative for environmental allergies. Does not bruise/bleed easily.   Psychiatric/Behavioral:  Negative for depression. The patient has insomnia.     All other systems reviewed and are negative.  EKGs/Labs/Other Studies Reviewed:    The following studies were reviewed today: Prior CV studies:  CPET 11/2021: Interpretation   Notes: Patient gave a very good effort.  Pulse oximetry remained 95-98% throughout the duration of exercise. Exercise was performed on a cycle ergometer starting at [facility] and increasing by 10W/min.   ECG: Resting ECG in sinus rhythm with arrhythmia (PAC) and RBBB. HR response appropriate. There were no sustained arrhythmias or ST-T changes. BP response mildly hypertensive.   PFT:  Pre-exercise spirometry was within  mostly normal limits. The MVV was normal.   CPX:  Exercise testing with gas exchange demonstrates a normal peak VO2 of 15.6 ml/kg/min (84% of the age/gender/weight matched sedentary norms). The RER of 1.04 indicates a submaximal effort. When adjusted to the patient's ideal body weight of 149.3 lb (67.7 kg) the peak VO2 is 19.9 ml/kg (ibw)/min (90% of the ibw-adjusted predicted). The VE/VCO2 slope is mildly elevated and indicates increased dead space ventilation. The oxygen uptake efficiency slope (OUES) is normal. The VO2 at the ventilatory threshold was normal at 84% of the predicted peak VO2. At peak exercise, the ventilation reached 61% of the measured MVV indicating ventilatory reserve remained. The O2pulse (a surrogate for stroke volume) increased with incremental exercise reaching peak at 43m/beat, (91% predicted).   Conclusion: The interpretation of this test is limited due to submaximal effort during the exercise. Based on available data, exercise testing with gas exchange demonstrates normal functional capacity when compared to matched sedentary norms. There is no clear cardiopulmonary abnormality. Patient appears limited due to deconditioning and body habitus. There was a mildly hypertensive BP response for submaximal  effort.   Myoview 08/2021:   The patient reported no symptoms during the stress test.   ECG rhythm shows normal sinus rhythm. ECG demonstrates incomplete right bundle branch block. The ECG shows premature atrial contractions.   Arrhythmias during stress: occasional PACs. Arrhythmias during recovery: occasional PACs. ECG was interpretable and without significant changes. The ECG was not diagnostic due to pharmacologic protocol.   Overall image quality is good. Diaphragmatic attenuation artifact was present. Image quality affected due to significant extracardiac activity.   LV perfusion is normal. There is no evidence of ischemia. There is no evidence of infarction.   Nuclear stress EF: 55 %. The left ventricular ejection fraction is normal (55-65%). No evidence of transient ischemic dilation (TID) noted.   Prior study available for comparison from 03/20/2020.  No change from prior study.    Cardiac monitor 06/07/21 Patient had a minimum heart rate of 43 bpm, maximum heart rate of 171 bpm (during SVT), and average heart rate of 76 bpm. Predominant underlying rhythm was sinus rhythm. Frequent short runs of supraventricular tachycardia occurred lasting 45 seconds at longest with a max rate of 171 bpm at fastest (most much slower). Isolated PACs were frequent (7.3%). Isolated PVCs were rare (<1.0%). No triggered and diary events. Frequent, asymptomatic PACs and short runs of SVT   R cardiac catheterization 06/06/21 RA = 2 RV = 32/5 PA = 31/11 (19) PCW = 4 Fick cardiac output/index = 4.1/2.1 PVR = 3.6 WU FA sat = 98% PA sat = 68%, 70% PAPi = 10 Assessment: 1. Low filling pressures with moderately reduce CO   Echocardiogram 05/10/21 EF 60-65, mild LVH, GR 1 DD, mildly reduced RVSF, mild LAE, trivial MR, moderate AV calcification, mild AS (mean 11.7, V-max 250 cm/s)   Carotid [place]10/7/21 Bilat ICA 1-39   Event monitor 04/2020 Sinus bradycardia with minimum rate 48 BPM, pauses up to 3 seconds  even during awake hours. Frequent epsiodes of atrial fibrillation with RVR (2% of the monitoring time).  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    Myoview 03/20/20 EF 65, apical thinning artifact, no ischemia, low risk    Echocardiogram 11/19/2019 EF 60-65, moderate LVH, GR 1 DD, no RWMA, mild AI, mild MR, mildly dilated ascending aorta (39 mm)   LE arterial [place]5/29/2020 Right PTA and ATA occluded;  left ATA occluded   Myoview 10/21/2018 Low risk, mildly abnormal stress nuclear study with apical thinning but no ischemia; EF 52 with mild global hypokinesis    Holter 06/11/18 Very frequent supraventricular ectopy, contributing to 15% of overall beats. Very frequent couplets and a very short runs of atrial tachycardia.   Echocardiogram 10/23/2015 EF 55-60, normal wall motion, mildly calcified aortic valve leaflets, mild AI, mild to moderate MR   Cardiac catheterization 10/22/2015 LM ostial 50 LAD mid 90 RI ostial 90 LCx proximal 80; OM1 80; OM2 100 (culprit for MI) RCA mid 50 LIMA-LAD patent SVG-RPDA patent SVG-D1 patent SVG-OM1 100 SVG-OM2 100 Medical therapy   Pre-CABG Dopp[place] 09/28/2015 Summary: Findings suggest 1-39% internal carotid artery stenosis bilaterally. Vertebral arteries are patent with antegrade flow. Bilateral ABIs are within normal limits.     EKG:  EKG is personally reviewed. 01/22/2022: EKG was not ordered. 11/06/2021 [name], [name]): NSR/sinus arrhythmia, PACs, RBBB, QTc 484, no change from prior tracing 08/06/21: EKG was not ordered   Recent Labs: 11/06/2021: NT-Pro BNP 661 01/22/2022: ALT 21 06/17/2022: BUN 35; Creatinine, Ser 2.20; Hemoglobin 15.6; Platelets 210; Potassium 4.6; Sodium 137   Recent Lipid Panel    Component Value Date/Time   CHOL 121 01/22/2022 0815   TRIG 120 01/22/2022 0815   HDL 42 01/22/2022 0815   CHOLHDL 2.9 01/22/2022 0815   CHOLHDL 2.7 10/23/2015 0515   VLDL 20  10/23/2015 0515   LDLCALC 57 01/22/2022 0815     Risk Assessment/Calculations:    CHA2DS2-VASc Score = 6  { This indicates a 9.7% annual risk of stroke. The patient's score is based upon: CHF History: 1 HTN History: 1 Diabetes History: 1 Stroke History: 0 Vascular Disease History: 1 Age Score: 2 Gender Score: 0          Physical Exam:    VS:  There were no vitals taken for this visit.    Wt Readings from Last 3 Encounters:  06/17/22 176 lb 5.9 oz (80 kg)  06/10/22 184 lb (83.5 kg)  04/21/22 189 lb (85.7 kg)     GEN:  Well nourished, well developed in no acute distress HEENT: Normal NECK: No JVD; No carotid bruits CARDIAC: RRR with occasional skipped beat, no murmurs, rubs, gallops RESPIRATORY:  CTAB ABDOMEN: Soft, non-tender, non-distended MUSCULOSKELETAL:  No edema; No deformity  SKIN: Warm and dry NEUROLOGIC:  Alert and oriented x 3 PSYCHIATRIC:  Normal affect   ASSESSMENT:    No diagnosis found.   PLAN:    In order of problems listed above:  #Dyspnea on Exertion: Chronic and has been ongoing for several years. Extensive reassuring work-up. RHC with low filling pressures. TTE with normal BiV function, mild AS, G1DD. CPET with normal cardiopulm function. Does not appear to be cardiac in nature. He is planned to see Neurology as symptoms did not improve with ENT surgery.  -Does not appear to be cardiac in nature -Planned to see Neuro  #Multivessel CAD s/p CABG in 2017: Myoview 03/2020 with no evidence of ischemia or infarction. TTE 05/10/21 with EF 60-65, mild LVH, GR 1 DD, mildly reduced RVSF, mild LAE, trivial MR, moderate AV calcification, mild AS (mean 11.7, V-max 250 cm/s). No chest pain currently.  -Not on ASA due to need for apixaban -Continue lipitor '80mg'$  daily -Not on BB due to episodes of bradycardia -Not on ACE/ARB due to CKD stage IV  #Paroxysmal Afib: #Tachy-brady syndrome CHADs-vasc 6 on apixaban. Tolerating AC without issues. Has  declined loop monitor. -  Continue apixaban '5mg'$  BID -Declined loop  #Chronic HFpEF: Normal filling pressures on RHC in 04/2021. Compensated and euvolemic on exam. SOB does not correlate with elevated filling pressures. -Continue lasix '20mg'$  daily -Not on ACE/ARB/spiro due to CKD IV  #Mild AS: Mean gradient 55mHg on TTE 04/2021. -Continue serial monitoring with next TTE 04/2023  #CKD Stage IV: Follows with Dr. WJustin Mend  #HLD: -Continue lipitor '80mg'$  daily -Goal LDL<70    Follow-up:  6 months  Medication Adjustments/Labs and Tests Ordered: Current medicines are reviewed at length with the patient today.  Concerns regarding medicines are outlined above.   No orders of the defined types were placed in this encounter.  No orders of the defined types were placed in this encounter.  There are no Patient Instructions on file for this visit.     Signed, HFreada Bergeron MD  07/16/2022 8:29 PM    [facility]

## 2022-07-17 ENCOUNTER — Ambulatory Visit: Payer: Medicare Other | Attending: Cardiology | Admitting: Cardiology

## 2022-07-17 VITALS — BP 118/76 | HR 73 | Ht 66.0 in | Wt 175.6 lb

## 2022-07-17 DIAGNOSIS — I2510 Atherosclerotic heart disease of native coronary artery without angina pectoris: Secondary | ICD-10-CM | POA: Diagnosis not present

## 2022-07-17 DIAGNOSIS — I5032 Chronic diastolic (congestive) heart failure: Secondary | ICD-10-CM

## 2022-07-17 DIAGNOSIS — I480 Paroxysmal atrial fibrillation: Secondary | ICD-10-CM

## 2022-07-17 DIAGNOSIS — N184 Chronic kidney disease, stage 4 (severe): Secondary | ICD-10-CM

## 2022-07-17 DIAGNOSIS — E785 Hyperlipidemia, unspecified: Secondary | ICD-10-CM

## 2022-07-17 DIAGNOSIS — I10 Essential (primary) hypertension: Secondary | ICD-10-CM

## 2022-07-17 DIAGNOSIS — I350 Nonrheumatic aortic (valve) stenosis: Secondary | ICD-10-CM | POA: Diagnosis not present

## 2022-07-17 DIAGNOSIS — R0602 Shortness of breath: Secondary | ICD-10-CM

## 2022-07-17 NOTE — Patient Instructions (Signed)
Medication Instructions:   Your physician recommends that you continue on your current medications as directed. Please refer to the Current Medication list given to you today.  *If you need a refill on your cardiac medications before your next appointment, please call your pharmacy*    Testing/Procedures:  Your physician has requested that you have an echocardiogram. Echocardiography is a painless test that uses sound waves to create images of your heart. It provides your doctor with information about the size and shape of your heart and how well your heart's chambers and valves are working. This procedure takes approximately one hour. There are no restrictions for this procedure.  PLEASE SCHEDULE ECHO TO BE DONE IN 6 MONTHS PER DR. Johney Frame   Please do NOT wear cologne, perfume, aftershave, or lotions (deodorant is allowed). Please arrive 15 minutes prior to your appointment time.   Follow-Up: At [facility], you and your health needs are our priority.  As part of our continuing mission to provide you with exceptional heart care, we have created designated Provider Care Teams.  These Care Teams include your primary Cardiologist (physician) and Advanced Practice Providers (APPs -  Physician Assistants and Nurse Practitioners) who all work together to provide you with the care you need, when you need it.  We recommend signing up for the patient portal called "MyChart".  Sign up information is provided on this After Visit Summary.  MyChart is used to connect with patients for Virtual Visits (Telemedicine).  Patients are able to view lab/test results, encounter notes, upcoming appointments, etc.  Non-urgent messages can be sent to your provider as well.   To learn more about what you can do with MyChart, go to [email or web address].    Your next appointment:   6 month(s)  The format for your next appointment:   In Person  Provider:   [name] [name]   Important Information  About [place]

## 2022-07-17 NOTE — Progress Notes (Signed)
Cardiology Office Note:    Date:  07/17/2022   ID:  Fernando Hess, DOB [birth year], MRN [id number]  PCP:  [name], MD   [facility] Cardiologist:  Freada Bergeron, MD Cardiology APP:  Liliane Shi, [name]  Electrophysiologist:  Vickie Epley, MD  {  Referring MD: [name], MD    History of Present Illness:    Fernando Hess is a 76 y.o. male with a hx of CAD s/p CABG in 2017, HFpEF, pAfib on apixaban, mild AS, DMII, HTN, HLD, CKD IV, OSA, and PAD who was previously followed by Dr. Meda Coffee who now presents to clinic for follow-up.   Per review of the record, the patient suffered a myocardial infarction in January 2017 and underwent multivessel CABG.  Postoperative course was complicated by atrial fibrillation treated with amiodarone.  He presented back to the hospital in 10/2015 with an inferolateral ST elevation myocardial infarction.  He had significant hyperkalemia and nausea and vomiting. Amiodarone was stopped secondary to nausea and vomiting.  Cardiac catheterization demonstrated an occluded SVG-OM1 and occluded SVG-OM2.  His distal OM was occluded.  The distal OM occlusion was felt to be the culprit for his myocardial infarction.  He had diabetic appearing vessels and medical therapy was recommended.   The patient has been struggling with episode of falls as well as shortness of breath.  With regards to shortness of breath he tries to walk a mile every day and has to stop several times. He underwent nuclear stress test 03/2020  that showed no evidence of prior infarct or ischemia.   With regards to dizziness he says that it has improved and he has not had any falls since he started to use a cane.  He saw Dr. 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 [name] recommended a loop monitor however patient was not interested at that time.  He was admitted 05/10/21-05/12/21 with episodes of syncope and  shortness of breath.  TTE demonstrated normal EF.  His BNP was mildly elevated and he was diuresed with IV furosemide.  PRN furosemide was recommended at DC.  His beta-blocker was DC'd.  He had orthostatic hypotension noted.  He saw Dr. [name] after DC and was set up for RHC due to ongoing shortness of breath and weakness.  RHC showed low filling pressures.  He was asked to reduce furosemide use (he had been taking it daily prior to [place]) to 3 times a week.  An OP event monitor showed frequent asymptomatic PACs and short runs of Supraventricular Tachycardia (no AF or pauses).  Saw [name] on 06/26/21 where he continued to have SOB and was being seen by Pulmonary. Appeared compensated from volume standpoint. BNP was elevated at 627.  Was seen as pre-op visit prior to ENT surgery in 07/2021 where myoview 08/16/21 was normal with EF 55%.   Saw [name] on 10/2020 where he continued to have SOB. He underwent CPET which was limited due to submaximal effort during exercise, however, based on available data, exercise testing with gas exchange demonstrated normal functional capacity. No clear cardiopulm abnormality.   Was last seen in clinic on 01/22/22 where he continued to feel SOB but was otherwise stable.   Today, the patient states he continues to feel poorly. Continues to have SOB and episdoes of dizziness and syncopal spells that have been ongoing for years. Has had extensive cardiac work-up including multiple monitors, RHC, CPET, mulitple echoes, and  stress testing all of which have been unrevealing. He is currently following with Neuro as Pulm and GI work-up has been reassuring.   Otherwise, his blood pressure is well controlled. No significant edema and takes lasix about every other day. No exertional chest pain. Tolerating medications. Denies bleeding on apixaban.  He denies any palpitations, chest pain, or peripheral edema. No lightheadedness, headaches, orthopnea, or PND.  Past  Medical History:  Diagnosis Date   Acute renal failure (ARF) ([place]) 09/24/2015   Atrial premature complexes    CAD (coronary artery disease) of artery bypass graft    Early occlusion of saphenous vein graft to intermediate and marginal branch in February 2007 following bypass grafting    CAD (coronary artery disease), native coronary artery 2017   hx NSTEMI 09-24-2015  s/p  CABG x5 on 10-02-2015;  post op STEMI inferolateral wall,  SVG OM1 and SVG OM2 occluded, distal OM occlusion the calpruit, treated medically // Myoview 7/21: no ischemia, EF 65, low risk   CHF (congestive heart failure) ([name])    CKD (chronic kidney disease), stage III ([name])    Dyspnea    Elevated troponin    Erectile dysfunction    Esophageal reflux    History of atrial fibrillation    post op CABG 10-02-2015   History of kidney stones    History of non-ST elevation myocardial infarction (NSTEMI) 09/24/2015   s/p  CABG x5   History of ST elevation myocardial infarction (STEMI) 10/22/2015   inferior wall,  post op CABG 10-02-2015   Hyperlipidemia    Hypertension    Left ureteral stone    Mild atherosclerosis of both carotid arteries    Nephrolithiasis    per CT bilateral non-obstructive calculi   OSA (obstructive sleep apnea)    Peripheral artery disease    LE Arterial US 01/2019: R PTA and ATA occluded; L ATA occluded   RBBB (right bundle branch block)    Renal atrophy, right    Sleep apnea    wears cpap    ST elevation myocardial infarction (STEMI) of inferior wall ([place]) 10/22/2015   Type 2 diabetes mellitus treated with insulin ([place])    followed by pcp   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 ([place]) 03/01/2008   Wears glasses     Past Surgical History:  Procedure Laterality Date   [place] N/A 09/26/2015   Procedure: Left Heart Cath and Coronary Angiography;  Surgeon: Troy Sine, MD;  Location:  [facility];  Service: Cardiovascular;  Laterality: N/A;   CARDIAC CATHETERIZATION N/A 10/22/2015   Procedure: Left Heart Cath and Coronary Angiography;  Surgeon: Sherren Mocha, MD;  Location: [facility];  Service: Cardiovascular;  Laterality: N/A;   CATARACT EXTRACTION W/ INTRAOCULAR LENS  IMPLANT, BILATERAL  2017   COLONOSCOPY     CORONARY ARTERY BYPASS GRAFT N/A 10/02/2015   Procedure: CORONARY ARTERY BYPASS GRAFTING (CABG) X5 LIMA-LAD; SVG-DIAG; SVG-OM; SVG-PD; SVG-RAMUS TRANSESOPHAGEAL ECHOCARDIOGRAM (TEE) ENDOSCOPIC GREATER SAPHENOUS VEIN  HARVEST BILAT LE;  Surgeon: Ivin Poot, MD;  Location: [facility];  Service: Open Heart Surgery;  Laterality: N/A;   CYSTOSCOPY/URETEROSCOPY/HOLMIUM LASER/STENT PLACEMENT Left 08/10/2018   Procedure: CYSTOSCOPY/URETEROSCOPY/HOLMIUM LASER/STENT PLACEMENT;  Surgeon: Festus Aloe, MD;  Location: [facility];  Service: Urology;  Laterality: Left;   CYSTOSCOPY/URETEROSCOPY/HOLMIUM LASER/STENT PLACEMENT Left 09/10/2018   Procedure: CYSTOSCOPY/URETEROSCOPY/HOLMIUM LASER/STENT EXCHANGE;  Surgeon: Festus Aloe, MD;  Location: [facility];  Service: Urology;  Laterality: Left;   LEFT HEART CATHETERIZATION WITH CORONARY ANGIOGRAM N/A 04/13/2014   Procedure: LEFT HEART CATHETERIZATION WITH CORONARY ANGIOGRAM;  Surgeon: Jacolyn Reedy, MD;  Location: [facility];  Service: Cardiovascular;  Laterality: N/A;   LEG SURGERY Right age 73   closed reduction leg fracture   NASAL SEPTOPLASTY W/ TURBINOPLASTY Bilateral 08/30/2021   Procedure: NASAL SEPTOPLASTY WITH BILATERAL INFERIOR TURBINATE REDUCTION;  Surgeon: Jerrell Belfast, MD;  Location: [place];  Service: ENT;  Laterality: Bilateral;   POLYPECTOMY     RIGHT HEART CATH N/A 06/06/2021   Procedure: RIGHT HEART CATH;  Surgeon: Jolaine Artist, MD;  Location: [facility];  Service: Cardiovascular;  Laterality: N/A;   TEE WITHOUT CARDIOVERSION N/A 10/02/2015   Procedure:  TRANSESOPHAGEAL ECHOCARDIOGRAM (TEE);  Surgeon: Ivin Poot, MD;  Location: [facility];  Service: Open Heart Surgery;  Laterality: N/A;   URETEROSCOPY WITH HOLMIUM LASER LITHOTRIPSY Bilateral 2004;  2005  dr Risa Grill  '@[facility]'$    VASECTOMY      Current Medications: Current Meds  Medication Sig   acetaminophen (TYLENOL) 325 MG tablet Take 325 mg by mouth every 6 (six) hours as needed for headache (pain).   apixaban (ELIQUIS) 5 MG TABS tablet TAKE ONE TABLET BY MOUTH EVERY MORNING and TAKE ONE TABLET BY MOUTH EVERY EVENING   B-D UF III MINI PEN NEEDLES 31G X 5 MM MISC USE AS DIRECTED WITH LANTUS SOLOSTAR   busPIRone (BUSPAR) 10 MG tablet Take 1 tablet by mouth daily.   Continuous Blood Gluc Sensor (FREESTYLE LIBRE 2 SENSOR) MISC replace sensor every TWO WEEKS   diphenhydrAMINE (BENADRYL) 50 MG/ML injection    DULoxetine (CYMBALTA) 30 MG capsule Take 30 mg by mouth every morning. Take with a 60 mg capsule for a total morning dose of 90 mg   DULoxetine (CYMBALTA) 60 MG capsule Take 60 mg by mouth every morning. Take with a 30 mg capsule for a total morning dose of 90 mg   furosemide (LASIX) 20 MG tablet Take 20 mg by mouth every other day. Not taking on weekends   glimepiride (AMARYL) 4 MG tablet Take 1 tablet by mouth 2 (two) times daily.   guaiFENesin-codeine (ROBITUSSIN AC) 100-10 MG/5ML syrup take 74m BY MOUTH EVERY 4 HOURS AS NEEDED   ibuprofen (ADVIL) 600 MG tablet    Insulin Glargine (BASAGLAR KWIKPEN) 100 UNIT/ML SOPN Inject 40 Units into the skin daily.   insulin lispro (HUMALOG) 100 UNIT/ML KwikPen Inject 14 Units into the skin 3 (three) times daily.   MAGNESIUM PO Take 350 mg by mouth every evening.   Multiple Vitamin (MULTIVITAMIN WITH MINERALS) TABS tablet Take 1 tablet by mouth every morning.   nitroGLYCERIN (NITROSTAT) 0.4 MG SL tablet Place 0.4 mg under the tongue every 5 (five) minutes as needed for chest pain.   OCTAGAM 20 GM/200ML SOLN Inject into the vein.   OCTAGAM 5 GM/50ML SOLN  Inject into the vein.   ondansetron (ZOFRAN-ODT) 4 MG disintegrating tablet Take 4 mg by mouth every 8 (eight) hours as needed for nausea or vomiting.   pantoprazole (PROTONIX) 40 MG tablet TAKE 1 TABLET BY MOUTH EVERY DAY   pioglitazone (ACTOS) 45 MG tablet Take 1 tablet by mouth daily.   promethazine (PHENERGAN) 25 MG tablet TAKE 1 TABLET BY MOUTH EVERY 6 HOURS AS NEEDED FOR NAUSEA FOR 10 DAYS   valsartan (DIOVAN) 160 MG tablet Take 1 tablet by mouth daily.   Current Facility-Administered Medications for  the 07/17/22 encounter (Office Visit) with Freada Bergeron, MD  Medication   sodium chloride flush (NS) 0.9 % injection 3 mL     Allergies:   Cilostazol, Dulaglutide, Levofloxacin, Liraglutide, and Lisinopril   Social History   Socioeconomic History   Marital status: Married    Spouse name: Not on file   Number of children: Not on file   Years of education: Not on file   Highest education level: Not on file  Occupational History   Occupation: retired  Tobacco Use   Smoking status: Never   Smokeless tobacco: Never  Vaping Use   Vaping Use: Never used  Substance and Sexual Activity   Alcohol use: Not Currently   Drug use: Never   Sexual activity: Not on file  Other Topics Concern   Not on file  Social History Narrative   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 There is no history of Colon cancer, Esophageal cancer, Prostate cancer, Rectal cancer, Stomach cancer, or Colon polyps.  ROS:   Please see the history of present illness.    Review of Systems   Constitutional:  Negative for chills and fever.  HENT:  Negative for congestion, hearing loss and tinnitus.   Eyes:  Negative for blurred vision and double vision.  Respiratory:  Positive for cough (occurs in the middle of the night) and shortness of breath (exertional). Negative for hemoptysis.   Cardiovascular:  Negative for chest pain, palpitations, orthopnea, claudication, leg swelling and PND.  Gastrointestinal:  Negative for diarrhea, heartburn and nausea.  Genitourinary:  Negative for dysuria and urgency.  Musculoskeletal:  Negative for myalgias and neck pain.  Skin:  Negative for itching and rash.  Neurological:  Positive for loss of consciousness (multiple falls, hit left knee). Negative for dizziness and headaches.  Endo/Heme/Allergies:  Negative for environmental allergies. Does not bruise/bleed easily.  Psychiatric/Behavioral:  Negative for depression. The patient has insomnia.     All other systems reviewed and are negative.  EKGs/Labs/Other Studies Reviewed:    The following studies were reviewed today: Prior CV studies:  CPET 11/2021: Interpretation   Notes: Patient gave a very good effort.  Pulse oximetry remained 95-98% throughout the duration of exercise. Exercise was performed on a cycle ergometer starting at [facility] and increasing by 10W/min.   ECG: Resting ECG in sinus rhythm with arrhythmia (PAC) and RBBB. HR response appropriate. There were no sustained arrhythmias or ST-T changes. BP response mildly hypertensive.   PFT:  Pre-exercise spirometry was within mostly normal limits. The MVV was normal.   CPX:  Exercise testing with gas exchange demonstrates a normal peak VO2 of 15.6 ml/kg/min (84% of the age/gender/weight matched sedentary norms). The RER of 1.04 indicates a submaximal effort. When adjusted to the patient's ideal body weight of 149.3 lb (67.7 kg) the peak VO2 is 19.9 ml/kg (ibw)/min (90% of the ibw-adjusted predicted). The VE/VCO2 slope is mildly elevated  and indicates increased dead space ventilation. The oxygen uptake efficiency slope (OUES) is normal. The VO2 at the ventilatory threshold was  normal at 84% of the predicted peak VO2. At peak exercise, the ventilation reached 61% of the measured MVV indicating ventilatory reserve remained. The O2pulse (a surrogate for stroke volume) increased with incremental exercise reaching peak at 90m/beat, (91% predicted).   Conclusion: The interpretation of this test is limited due to submaximal effort during the exercise. Based on available data, exercise testing with gas exchange demonstrates normal functional capacity when compared to matched sedentary norms. There is no clear cardiopulmonary abnormality. Patient appears limited due to deconditioning and body habitus. There was a mildly hypertensive BP response for submaximal effort.   Myoview 08/2021:   The patient reported no symptoms during the stress test.   ECG rhythm shows normal sinus rhythm. ECG demonstrates incomplete right bundle branch block. The ECG shows premature atrial contractions.   Arrhythmias during stress: occasional PACs. Arrhythmias during recovery: occasional PACs. ECG was interpretable and without significant changes. The ECG was not diagnostic due to pharmacologic protocol.   Overall image quality is good. Diaphragmatic attenuation artifact was present. Image quality affected due to significant extracardiac activity.   LV perfusion is normal. There is no evidence of ischemia. There is no evidence of infarction.   Nuclear stress EF: 55 %. The left ventricular ejection fraction is normal (55-65%). No evidence of transient ischemic dilation (TID) noted.   Prior study available for comparison from 03/20/2020.  No change from prior study.    Cardiac monitor 06/07/21 Patient had a minimum heart rate of 43 bpm, maximum heart rate of 171 bpm (during SVT), and average heart rate of 76 bpm. Predominant underlying rhythm was sinus rhythm. Frequent  short runs of supraventricular tachycardia occurred lasting 45 seconds at longest with a max rate of 171 bpm at fastest (most much slower). Isolated PACs were frequent (7.3%). Isolated PVCs were rare (<1.0%). No triggered and diary events. Frequent, asymptomatic PACs and short runs of SVT   R cardiac catheterization 06/06/21 RA = 2 RV = 32/5 PA = 31/11 (19) PCW = 4 Fick cardiac output/index = 4.1/2.1 PVR = 3.6 WU FA sat = 98% PA sat = 68%, 70% PAPi = 10 Assessment: 1. Low filling pressures with moderately reduce CO   Echocardiogram 05/10/21 EF 60-65, mild LVH, GR 1 DD, mildly reduced RVSF, mild LAE, trivial MR, moderate AV calcification, mild AS (mean 11.7, V-max 250 cm/s)   Carotid [place]10/7/21 Bilat ICA 1-39   Event monitor 04/2020 Sinus bradycardia with minimum rate 48 BPM, pauses up to 3 seconds even during awake hours. Frequent epsiodes of atrial fibrillation with RVR (2% of the monitoring time).  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    Myoview 03/20/20 EF 65, apical thinning artifact, no ischemia, low risk    Echocardiogram 11/19/2019 EF 60-65, moderate LVH, GR 1 DD, no RWMA, mild AI, mild MR, mildly dilated ascending aorta (39 mm)   LE arterial [place]5/29/2020 Right PTA and ATA occluded; left ATA occluded   Myoview 10/21/2018 Low risk, mildly abnormal stress nuclear study with apical thinning but no ischemia; EF 52 with mild global hypokinesis    Holter 06/11/18 Very frequent supraventricular ectopy, contributing to 15% of overall beats. Very frequent couplets and a very short runs of atrial tachycardia.   Echocardiogram 10/23/2015 EF 55-60, normal wall motion, mildly calcified aortic valve leaflets, mild AI, mild to moderate MR   Cardiac catheterization 10/22/2015 LM ostial 50 LAD mid 90 RI ostial 90 LCx proximal 80; OM1 80; OM2  100 (culprit for MI) RCA mid 50 LIMA-LAD patent SVG-RPDA patent SVG-D1  patent SVG-OM1 100 SVG-OM2 100 Medical therapy   Pre-CABG Dopplers 09/28/2015 Summary: Findings suggest 1-39% internal carotid artery stenosis bilaterally. Vertebral arteries are patent with antegrade flow. Bilateral ABIs are within normal limits.     EKG:  EKG is personally reviewed. 07/17/2022: EKG was not ordered. 06/17/2022: Sinus rhythm IRBBB and LPFB Confirmed. Rate 93 bpm. 01/22/2022: EKG was not ordered. 11/06/2021 [name], [name]): NSR/sinus arrhythmia, PACs, RBBB, QTc 484, no change from prior tracing 08/06/21: EKG was not ordered   Recent Labs: 11/06/2021: NT-Pro BNP 661 01/22/2022: ALT 21 06/17/2022: BUN 35; Creatinine, Ser 2.20; Hemoglobin 15.6; Platelets 210; Potassium 4.6; Sodium 137   Recent Lipid Panel    Component Value Date/Time   CHOL 121 01/22/2022 0815   TRIG 120 01/22/2022 0815   HDL 42 01/22/2022 0815   CHOLHDL 2.9 01/22/2022 0815   CHOLHDL 2.7 10/23/2015 0515   VLDL 20 10/23/2015 0515   LDLCALC 57 01/22/2022 0815     Risk Assessment/Calculations:    CHA2DS2-VASc Score = 6  { This indicates a 9.7% annual risk of stroke. The patient's score is based upon: CHF History: 1 HTN History: 1 Diabetes History: 1 Stroke History: 0 Vascular Disease History: 1 Age Score: 2 Gender Score: 0          Physical Exam:    VS:  BP 118/76   Pulse 73   Ht '5\' 6"'$  (1.676 m)   Wt 175 lb 9.6 oz (79.7 kg)   SpO2 98%   BMI 28.34 kg/m     Wt Readings from Last 3 Encounters:  07/17/22 175 lb 9.6 oz (79.7 kg)  06/17/22 176 lb 5.9 oz (80 kg)  06/10/22 184 lb (83.5 kg)     GEN:  Well nourished, well developed in no acute distress HEENT: Normal NECK: No JVD; No carotid bruits CARDIAC:  Irregularly irregular, 2/6 systolic murmur RESPIRATORY:  CTAB ABDOMEN: Soft, non-tender, non-distended MUSCULOSKELETAL:  No edema; No deformity  SKIN: Warm and dry NEUROLOGIC:  Alert and oriented x 3 PSYCHIATRIC:  Normal affect   ASSESSMENT:    1. Coronary artery  disease involving native coronary artery of native heart without angina pectoris   2. Mild aortic stenosis   3. Essential hypertension   4. Hyperlipidemia LDL goal <70   5. SOB (shortness of breath)   6. CKD (chronic kidney disease) stage 4, GFR 15-29 ml/min ([name])   7. Chronic heart failure with preserved ejection fraction (HFpEF) ([place])   8. Paroxysmal atrial fibrillation ([name])   9. Shortness of breath      PLAN:    In order of problems listed above:  #Dyspnea on Exertion: Chronic and has been ongoing for several years. Extensive reassuring work-up. RHC with low filling pressures. TTE with normal BiV function, mild AS, G1DD. CPET with normal cardiopulm function. Does not appear to be cardiac in nature. Following with Neuro at this time.  -Does not appear to be cardiac in nature -Planned to see Neuro  #Multivessel CAD s/p CABG in 2017: Myoview 03/2020 with no evidence of ischemia or infarction. TTE 05/10/21 with EF 60-65, mild LVH, GR 1 DD, mildly reduced RVSF, mild LAE, trivial MR, moderate AV calcification, mild AS (mean 11.7, V-max 250 cm/s). No chest pain currently.  -Not on ASA due to need for apixaban -Continue lipitor '80mg'$  daily -Not on BB due to episodes of bradycardia -Not on ACE/ARB due to CKD stage IV  #Paroxysmal  Afib: #Tachy-brady syndrome CHADs-vasc 6 on apixaban. Tolerating AC without issues. Has declined loop monitor but it is possible that he is having pauses that may be contributing to his syncope. Multiple cardiac monitors unrevealing.  -Continue apixaban '5mg'$  BID -Declined loop on multiple occasions  #Chronic HFpEF: Normal filling pressures on RHC in 04/2021. Compensated and euvolemic on exam. SOB does not correlate with elevated filling pressures. -Continue lasix '20mg'$  daily -Not on ACE/ARB/spiro due to CKD IV  #Mild AS: Mean gradient 87mHg on TTE 04/2021. -Continue serial monitoring with next TTE in 652month #CKD Stage IV: Follows with Dr.  WeJustin Mend #HLD: -Continue lipitor '80mg'$  daily -Goal LDL<70       Follow-up:   6 months  Medication Adjustments/Labs and Tests Ordered: Current medicines are reviewed at length with the patient today.  Concerns regarding medicines are outlined above.   Orders Placed This Encounter  Procedures   ECHOCARDIOGRAM COMPLETE   No orders of the defined types were placed in this encounter.  Patient Instructions  Medication Instructions:   Your physician recommends that you continue on your current medications as directed. Please refer to the Current Medication list given to you today.  *If you need a refill on your cardiac medications before your next appointment, please call your pharmacy*    Testing/Procedures:  Your physician has requested that you have an echocardiogram. Echocardiography is a painless test that uses sound waves to create images of your heart. It provides your doctor with information about the size and shape of your heart and how well your heart's chambers and valves are working. This procedure takes approximately one hour. There are no restrictions for this procedure.  PLEASE SCHEDULE ECHO TO BE DONE IN 6 MONTHS PER DR. PEJohney Frame Please do NOT wear cologne, perfume, aftershave, or lotions (deodorant is allowed). Please arrive 15 minutes prior to your appointment time.   Follow-Up: [facility] and [facility] needs are our priority.  As part of our continuing mission to provide you with exceptional heart care, we have created designated Provider Care Teams.  These Care Teams include your primary Cardiologist (physician) and Advanced Practice Providers (APPs -  Physician Assistants and Nurse Practitioners) who all work together to provide you with the care you need, when you need it.  We recommend signing up for the patient portal called "MyChart".  Sign up information is provided on this After Visit Summary.  MyChart is used to connect with patients for  Virtual Visits (Telemedicine).  Patients are able to view lab/test results, encounter notes, upcoming appointments, etc.  Non-urgent messages can be sent to your provider as well.   To learn more about what you can do with MyChart, go to [email or web address]   Your next appointment:   6 month(s)  The format for your next appointment:   In Person  Provider:   SCRichardson DoppA-C   Important Information About Sugar          I,Rachel Rivera,acting as a scribe for HeFreada BergeronMD.,have documented all relevant documentation on the behalf of HeFreada BergeronMD,as directed by  HeFreada BergeronMD while in the presence of HeFreada BergeronMD.  I, HeFreada BergeronMD, have reviewed all documentation for this visit. The documentation on 07/17/22 for the exam, diagnosis, procedures, and orders are all accurate and complete.   Signed, HeFreada BergeronMD  07/17/2022 10:20 AM    [facility]

## 2022-08-12 ENCOUNTER — Encounter (HOSPITAL_COMMUNITY): Payer: Self-pay

## 2022-08-12 ENCOUNTER — Observation Stay (HOSPITAL_COMMUNITY): Payer: Medicare Other

## 2022-08-12 ENCOUNTER — Emergency Department (HOSPITAL_COMMUNITY): Payer: Medicare Other

## 2022-08-12 ENCOUNTER — Other Ambulatory Visit: Payer: Self-pay

## 2022-08-12 ENCOUNTER — Observation Stay (HOSPITAL_COMMUNITY)
Admission: EM | Admit: 2022-08-12 | Discharge: 2022-08-14 | Disposition: A | Discharge status: Home Health | Payer: Medicare Other | Attending: Family Medicine | Admitting: Family Medicine

## 2022-08-12 DIAGNOSIS — K219 Gastro-esophageal reflux disease without esophagitis: Secondary | ICD-10-CM | POA: Diagnosis not present

## 2022-08-12 DIAGNOSIS — R531 Weakness: Principal | ICD-10-CM | POA: Insufficient documentation

## 2022-08-12 DIAGNOSIS — I10 Essential (primary) hypertension: Secondary | ICD-10-CM | POA: Diagnosis present

## 2022-08-12 DIAGNOSIS — Z79899 Other long term (current) drug therapy: Secondary | ICD-10-CM | POA: Diagnosis not present

## 2022-08-12 DIAGNOSIS — N1832 Chronic kidney disease, stage 3b: Secondary | ICD-10-CM | POA: Diagnosis not present

## 2022-08-12 DIAGNOSIS — Z794 Long term (current) use of insulin: Secondary | ICD-10-CM | POA: Insufficient documentation

## 2022-08-12 DIAGNOSIS — E1169 Type 2 diabetes mellitus with other specified complication: Secondary | ICD-10-CM | POA: Diagnosis present

## 2022-08-12 DIAGNOSIS — R296 Repeated falls: Secondary | ICD-10-CM | POA: Insufficient documentation

## 2022-08-12 DIAGNOSIS — Z7901 Long term (current) use of anticoagulants: Secondary | ICD-10-CM | POA: Diagnosis not present

## 2022-08-12 DIAGNOSIS — I739 Peripheral vascular disease, unspecified: Secondary | ICD-10-CM | POA: Diagnosis present

## 2022-08-12 DIAGNOSIS — E1122 Type 2 diabetes mellitus with diabetic chronic kidney disease: Secondary | ICD-10-CM | POA: Diagnosis not present

## 2022-08-12 DIAGNOSIS — I130 Hypertensive heart and chronic kidney disease with heart failure and stage 1 through stage 4 chronic kidney disease, or unspecified chronic kidney disease: Secondary | ICD-10-CM | POA: Diagnosis not present

## 2022-08-12 DIAGNOSIS — E785 Hyperlipidemia, unspecified: Secondary | ICD-10-CM | POA: Diagnosis present

## 2022-08-12 DIAGNOSIS — I2510 Atherosclerotic heart disease of native coronary artery without angina pectoris: Secondary | ICD-10-CM | POA: Insufficient documentation

## 2022-08-12 DIAGNOSIS — Z951 Presence of aortocoronary bypass graft: Secondary | ICD-10-CM | POA: Diagnosis not present

## 2022-08-12 DIAGNOSIS — F419 Anxiety disorder, unspecified: Secondary | ICD-10-CM | POA: Diagnosis present

## 2022-08-12 DIAGNOSIS — G7000 Myasthenia gravis without (acute) exacerbation: Secondary | ICD-10-CM | POA: Diagnosis present

## 2022-08-12 DIAGNOSIS — E113313 Type 2 diabetes mellitus with moderate nonproliferative diabetic retinopathy with macular edema, bilateral: Secondary | ICD-10-CM | POA: Insufficient documentation

## 2022-08-12 DIAGNOSIS — E119 Type 2 diabetes mellitus without complications: Secondary | ICD-10-CM | POA: Diagnosis present

## 2022-08-12 DIAGNOSIS — I480 Paroxysmal atrial fibrillation: Secondary | ICD-10-CM | POA: Diagnosis not present

## 2022-08-12 DIAGNOSIS — G4733 Obstructive sleep apnea (adult) (pediatric): Secondary | ICD-10-CM

## 2022-08-12 DIAGNOSIS — I5032 Chronic diastolic (congestive) heart failure: Secondary | ICD-10-CM | POA: Insufficient documentation

## 2022-08-12 LAB — COMPREHENSIVE METABOLIC PANEL
ALT: 45 U/L — ABNORMAL HIGH (ref 0–44)
AST: 27 U/L (ref 15–41)
Albumin: 2.3 g/dL — ABNORMAL LOW (ref 3.5–5.0)
Alkaline Phosphatase: 99 U/L (ref 38–126)
Anion gap: 10 (ref 5–15)
BUN: 25 mg/dL — ABNORMAL HIGH (ref 8–23)
CO2: 23 mmol/L (ref 22–32)
Calcium: 8.9 mg/dL (ref 8.9–10.3)
Chloride: 104 mmol/L (ref 98–111)
Creatinine, Ser: 1.84 mg/dL — ABNORMAL HIGH (ref 0.61–1.24)
GFR, Estimated: 37 mL/min — ABNORMAL LOW (ref >60)
Glucose, Bld: 174 mg/dL — ABNORMAL HIGH (ref 70–99)
Potassium: 4.2 mmol/L (ref 3.5–5.1)
Sodium: 137 mmol/L (ref 135–145)
Total Bilirubin: 0.9 mg/dL (ref 0.3–1.2)
Total Protein: 5 g/dL — ABNORMAL LOW (ref 6.5–8.1)

## 2022-08-12 LAB — CBC WITH DIFFERENTIAL/PLATELET
Abs Immature Granulocytes: 0.09 10*3/uL — ABNORMAL HIGH (ref 0.00–0.07)
Basophils Absolute: 0.1 10*3/uL (ref 0.0–0.1)
Basophils Relative: 1 %
Eosinophils Absolute: 0.2 10*3/uL (ref 0.0–0.5)
Eosinophils Relative: 2 %
HCT: 42.8 % (ref 39.0–52.0)
Hemoglobin: 13.6 g/dL (ref 13.0–17.0)
Immature Granulocytes: 1 %
Lymphocytes Relative: 10 %
Lymphs Abs: 0.8 10*3/uL (ref 0.7–4.0)
MCH: 29.3 pg (ref 26.0–34.0)
MCHC: 31.8 g/dL (ref 30.0–36.0)
MCV: 92.2 fL (ref 80.0–100.0)
Monocytes Absolute: 0.9 10*3/uL (ref 0.1–1.0)
Monocytes Relative: 11 %
Neutro Abs: 6.6 10*3/uL (ref 1.7–7.7)
Neutrophils Relative %: 75 %
Platelets: 222 10*3/uL (ref 150–400)
RBC: 4.64 MIL/uL (ref 4.22–5.81)
RDW: 16.5 % — ABNORMAL HIGH (ref 11.5–15.5)
WBC: 8.7 10*3/uL (ref 4.0–10.5)
nRBC: 0 % (ref 0.0–0.2)

## 2022-08-12 LAB — CBG MONITORING, ED
Glucose-Capillary: 97 mg/dL (ref 70–99)
Glucose-Capillary: 477 mg/dL — ABNORMAL HIGH (ref 70–99)
Glucose-Capillary: 205 mg/dL — ABNORMAL HIGH (ref 70–99)

## 2022-08-12 MED ORDER — INSULIN ASPART 100 UNIT/ML IJ SOLN: 0.0000 [IU] | Freq: Three times a day (TID) | INTRAMUSCULAR | Status: DC

## 2022-08-12 MED ORDER — ATORVASTATIN CALCIUM 80 MG PO TABS
80.0000 mg | ORAL_TABLET | Freq: Every day | ORAL | Status: DC
  Administered 2022-08-13 – 2022-08-14 (×2): 80 mg via ORAL
  Filled 2022-08-12: qty 2
  Filled 2022-08-12: qty 1

## 2022-08-12 MED ORDER — ACETAMINOPHEN 325 MG PO TABS
650.0000 mg | ORAL_TABLET | Freq: Four times a day (QID) | ORAL | Status: DC | PRN
  Administered 2022-08-13 – 2022-08-14 (×3): 650 mg via ORAL
  Filled 2022-08-12 (×4): qty 2

## 2022-08-12 MED ORDER — ACETAMINOPHEN 500 MG PO TABS
1000.0000 mg | ORAL_TABLET | Freq: Once | ORAL | Status: AC
  Administered 2022-08-12: 1000 mg via ORAL
  Filled 2022-08-12: qty 2

## 2022-08-12 MED ORDER — INSULIN ASPART 100 UNIT/ML IJ SOLN
0.0000 [IU] | Freq: Every day | INTRAMUSCULAR | Status: DC
  Administered 2022-08-12 – 2022-08-13 (×2): 2 [IU] via SUBCUTANEOUS

## 2022-08-12 MED ORDER — INSULIN ASPART 100 UNIT/ML IJ SOLN
0.0000 [IU] | Freq: Three times a day (TID) | INTRAMUSCULAR | Status: DC
  Administered 2022-08-13: 5 [IU] via SUBCUTANEOUS
  Administered 2022-08-13: 11 [IU] via SUBCUTANEOUS
  Administered 2022-08-13: 2 [IU] via SUBCUTANEOUS
  Administered 2022-08-14: 5 [IU] via SUBCUTANEOUS
  Administered 2022-08-14: 8 [IU] via SUBCUTANEOUS

## 2022-08-12 MED ORDER — DULOXETINE HCL 30 MG PO CPEP: 60.0000 mg | ORAL_CAPSULE | Freq: Every morning | ORAL | Status: DC

## 2022-08-12 MED ORDER — ACETAMINOPHEN 650 MG RE SUPP: 650.0000 mg | Freq: Four times a day (QID) | RECTAL | Status: DC | PRN

## 2022-08-12 MED ORDER — APIXABAN 5 MG PO TABS
5.0000 mg | ORAL_TABLET | Freq: Two times a day (BID) | ORAL | Status: DC
  Administered 2022-08-12 – 2022-08-14 (×4): 5 mg via ORAL
  Filled 2022-08-12 (×4): qty 1

## 2022-08-12 MED ORDER — BUSPIRONE HCL 10 MG PO TABS: 10.0000 mg | ORAL_TABLET | Freq: Every day | ORAL | Status: DC

## 2022-08-12 MED ORDER — CILOSTAZOL 50 MG PO TABS: 50.0000 mg | ORAL_TABLET | Freq: Two times a day (BID) | ORAL | Status: DC

## 2022-08-12 MED ORDER — NYSTATIN 100000 UNIT/GM EX POWD
1.0000 | Freq: Two times a day (BID) | CUTANEOUS | Status: DC
  Administered 2022-08-12 – 2022-08-14 (×3): 1 via TOPICAL
  Filled 2022-08-12: qty 15

## 2022-08-12 MED ORDER — POLYETHYLENE GLYCOL 3350 17 G PO PACK: 17.0000 g | PACK | Freq: Every day | ORAL | Status: DC | PRN

## 2022-08-12 MED ORDER — SODIUM CHLORIDE 0.9% FLUSH
3.0000 mL | Freq: Two times a day (BID) | INTRAVENOUS | Status: DC
  Administered 2022-08-12 – 2022-08-14 (×5): 3 mL via INTRAVENOUS

## 2022-08-12 MED ORDER — INSULIN GLARGINE-YFGN 100 UNIT/ML ~~LOC~~ SOLN
30.0000 [IU] | Freq: Every day | SUBCUTANEOUS | Status: DC
  Administered 2022-08-13 – 2022-08-14 (×2): 30 [IU] via SUBCUTANEOUS
  Filled 2022-08-12 (×2): qty 0.3

## 2022-08-12 MED ORDER — FUROSEMIDE 20 MG PO TABS
20.0000 mg | ORAL_TABLET | Freq: Every day | ORAL | Status: DC
  Administered 2022-08-13 – 2022-08-14 (×2): 20 mg via ORAL
  Filled 2022-08-12 (×2): qty 1

## 2022-08-12 MED ORDER — INSULIN ASPART 100 UNIT/ML IJ SOLN
18.0000 [IU] | Freq: Once | INTRAMUSCULAR | Status: AC
  Administered 2022-08-12: 18 [IU] via SUBCUTANEOUS

## 2022-08-12 MED ORDER — DULOXETINE HCL 60 MG PO CPEP
90.0000 mg | ORAL_CAPSULE | Freq: Every morning | ORAL | Status: DC
  Administered 2022-08-13 – 2022-08-14 (×2): 90 mg via ORAL
  Filled 2022-08-12: qty 1
  Filled 2022-08-12: qty 3

## 2022-08-12 MED ORDER — PANTOPRAZOLE SODIUM 40 MG PO TBEC
40.0000 mg | DELAYED_RELEASE_TABLET | Freq: Every day | ORAL | Status: DC
  Administered 2022-08-13 – 2022-08-14 (×2): 40 mg via ORAL
  Filled 2022-08-12 (×2): qty 1

## 2022-08-12 MED ORDER — HYDROXYZINE PAMOATE 25 MG PO CAPS: 25.0000 mg | ORAL_CAPSULE | Freq: Every evening | ORAL | Status: DC | PRN

## 2022-08-12 MED ORDER — OXYCODONE HCL 5 MG PO TABS
2.5000 mg | ORAL_TABLET | Freq: Once | ORAL | Status: AC
  Administered 2022-08-12: 2.5 mg via ORAL
  Filled 2022-08-12: qty 1

## 2022-08-12 NOTE — H&P (Addendum)
History and Physical   Fernando Hess RKY:[id number] DOB: 06-09-1946 DOA: 08/12/2022  PCP: [name], MD   Patient coming from: Home  Chief Complaint: Falls and recent fugax, anxiety, depression, diastolic CHF, CKD 3B, CAD status post CABG, hypertension, PAD, GERD, diabetes, hyperlipidemia, OSA, A-fib  HPI: Fernando Hess is a 76 y.o. male with medical history significant of, myasthenia gravis presenting with falls.  Patient reports 2 days of recurrent falls with 6 falls in that time.[name]  He states that he has history of falls and weakness in the setting of myasthenia gravis, but typically only falls once a week or so.  However, he states that he has had significant increase in the frequency of his falls recently and gets weakness sensation and shaky before fall.  He did hit his head when he fell today but did not lose consciousness.   No recent new medications other than antifungal.  He reports chronic shortness of breath that is no worse than usual.  He denies previous history of any myasthenic exacerbation.  He denies fever, chills, chest pain, abdominal pain, constipation, diarrhea, nausea, vomiting.  ED Course: Vital signs in the ED stable.  Lab workup included CMP with BUN of 25 and creatinine stable at 1.84, glucose 124, protein 5, albumin 2.3, ALT 45.  CBC within normal limits.  CT head showed no acute abnormality.  Patient received Tylenol and oxycodone in the ED.  Neurology is consulted and will see the patient.  Review of Systems: As per HPI otherwise all other systems reviewed and are negative.  Past Medical History:  Diagnosis Date   Acute renal failure (ARF) ([place]) 09/24/2015   Acute stress disorder 11/13/2021   Atrial premature complexes    CAD (coronary artery disease) of artery bypass graft    Early occlusion of saphenous vein graft to intermediate and marginal branch in February 2007 following bypass grafting    CAD (coronary artery disease), native coronary  artery 2017   hx NSTEMI 09-24-2015  s/p  CABG x5 on 10-02-2015;  post op STEMI inferolateral wall,  SVG OM1 and SVG OM2 occluded, distal OM occlusion the calpruit, treated medically // Myoview 7/21: no ischemia, EF 65, low risk   CHF (congestive heart failure) ([name])    CKD (chronic kidney disease), stage III ([name])    Contusion of right knee 03/16/2020   Dyspnea    Elevated troponin    Erectile dysfunction    Esophageal reflux    History of atrial fibrillation    post op CABG 10-02-2015   History of kidney stones    History of non-ST elevation myocardial infarction (NSTEMI) 09/24/2015   s/p  CABG x5   History of ST elevation myocardial infarction (STEMI) 10/22/2015   inferior wall,  post op CABG 10-02-2015   Hyperlipidemia    Hypertension    Left ureteral stone    Mild atherosclerosis of both carotid arteries    Nephrolithiasis    per CT bilateral non-obstructive calculi   OSA (obstructive sleep apnea)    Peripheral artery disease    LE Arterial US 01/2019: R PTA and ATA occluded; L ATA occluded   RBBB (right bundle branch block)    Renal atrophy, right    Sleep apnea    wears cpap    ST elevation myocardial infarction (STEMI) of inferior wall ([place]) 10/22/2015   Type 2 diabetes mellitus treated with insulin ([place])    followed by pcp   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 ([place]) 03/01/2008   Wears glasses     Past Surgical History:  Procedure Laterality Date   [place] N/A 09/26/2015   Procedure: Left Heart Cath and Coronary Angiography;  Surgeon: Troy Sine, MD;  Location: [facility];  Service: Cardiovascular;  Laterality: N/A;   CARDIAC CATHETERIZATION N/A 10/22/2015   Procedure: Left Heart Cath and Coronary Angiography;  Surgeon: Sherren Mocha, MD;  Location: [facility];  Service: Cardiovascular;  Laterality: N/A;   CATARACT EXTRACTION W/ INTRAOCULAR  LENS  IMPLANT, BILATERAL  2017   COLONOSCOPY     CORONARY ARTERY BYPASS GRAFT N/A 10/02/2015   Procedure: CORONARY ARTERY BYPASS GRAFTING (CABG) X5 LIMA-LAD; SVG-DIAG; SVG-OM; SVG-PD; SVG-RAMUS TRANSESOPHAGEAL ECHOCARDIOGRAM (TEE) ENDOSCOPIC GREATER SAPHENOUS VEIN  HARVEST BILAT LE;  Surgeon: Ivin Poot, MD;  Location: [facility];  Service: Open Heart Surgery;  Laterality: N/A;   CYSTOSCOPY/URETEROSCOPY/HOLMIUM LASER/STENT PLACEMENT Left 08/10/2018   Procedure: CYSTOSCOPY/URETEROSCOPY/HOLMIUM LASER/STENT PLACEMENT;  Surgeon: Festus Aloe, MD;  Location: [facility];  Service: Urology;  Laterality: Left;   CYSTOSCOPY/URETEROSCOPY/HOLMIUM LASER/STENT PLACEMENT Left 09/10/2018   Procedure: CYSTOSCOPY/URETEROSCOPY/HOLMIUM LASER/STENT EXCHANGE;  Surgeon: Festus Aloe, MD;  Location: [facility];  Service: Urology;  Laterality: Left;   LEFT HEART CATHETERIZATION WITH CORONARY ANGIOGRAM N/A 04/13/2014   Procedure: LEFT HEART CATHETERIZATION WITH CORONARY ANGIOGRAM;  Surgeon: Jacolyn Reedy, MD;  Location: [facility];  Service: Cardiovascular;  Laterality: N/A;   LEG SURGERY Right age 66   closed reduction leg fracture   NASAL SEPTOPLASTY W/ TURBINOPLASTY Bilateral 08/30/2021   Procedure: NASAL SEPTOPLASTY WITH BILATERAL INFERIOR TURBINATE REDUCTION;  Surgeon: Jerrell Belfast, MD;  Location: [facility];  Service: ENT;  Laterality: Bilateral;   POLYPECTOMY     RIGHT HEART CATH N/A 06/06/2021   Procedure: RIGHT HEART CATH;  Surgeon: Jolaine Artist, MD;  Location: [place] [facility];  Service: Cardiovascular;  Laterality: N/A;   TEE WITHOUT CARDIOVERSION N/A 10/02/2015   Procedure: TRANSESOPHAGEAL ECHOCARDIOGRAM (TEE);  Surgeon: Ivin Poot, MD;  Location: [facility];  Service: Open Heart Surgery;  Laterality: N/A;   URETEROSCOPY WITH HOLMIUM LASER LITHOTRIPSY Bilateral 2004;  2005  dr grapey  '@[facility]'$    VASECTOMY      Social History  reports that he has never smoked. He has never used  smokeless tobacco. He reports that he does not currently use alcohol. He reports that he does not use drugs.  Allergies  Allergen Reactions   Cilostazol Swelling and Other (See Comments)    edema  Other reaction(s): edema   Dulaglutide Nausea And Vomiting    Other reaction(s): vomiting Other reaction(s): vomiting   Levofloxacin Hives, Itching, Rash and Other (See Comments)    Other reaction(s): hives   Liraglutide Other (See Comments)    Severe fatigue & insomnia  Other reaction(s): severe fatigue and insomnia   Lisinopril Cough, Itching, Rash and Other (See Comments)    Other reaction(s): cough    Family History  Problem Relation Age of Onset   Heart attack Mother    Heart attack Father    Diabetes Brother    Pancreatic cancer Brother    Diabetes Brother    Colon cancer Neg Hx    Esophageal cancer Neg Hx    Prostate cancer Neg Hx    Rectal cancer Neg Hx    Stomach cancer Neg Hx    Colon polyps Neg Hx   Reviewed on  admission  Prior to Admission medications   Medication Sig Start Date End Date Taking? Authorizing Provider  acetaminophen (TYLENOL) 325 MG tablet Take 325 mg by mouth every 6 (six) hours as needed for headache (pain).    [provider]  acetaminophen-codeine (TYLENOL #3) 300-30 MG tablet Take 1 tablet by mouth every 6 (six) hours as needed.    [provider]  ALPRAZolam Duanne Moron) 0.25 MG tablet Take for anxiety 1 hour before PFT 10/22/21   Deneise Lever, MD  apixaban (ELIQUIS) 5 MG TABS tablet TAKE ONE TABLET BY MOUTH EVERY MORNING and TAKE ONE TABLET BY MOUTH EVERY EVENING 03/13/22   Freada Bergeron, MD  atorvastatin (LIPITOR) 80 MG tablet Take 1 tablet (80 mg total) by mouth daily. 11/07/21 02/05/22  [name], [name]  B-D UF III MINI PEN NEEDLES 31G X 5 MM MISC USE AS DIRECTED WITH LANTUS SOLOSTAR 04/02/19   [provider]  busPIRone (BUSPAR) 10 MG tablet Take 1 tablet by mouth daily.    [provider]  cilostazol  (PLETAL) 50 MG tablet Take 1 tablet by mouth 2 (two) times daily.    [provider]  Continuous Blood Gluc Sensor (FREESTYLE LIBRE 2 SENSOR) MISC replace sensor every TWO WEEKS 11/29/21   [provider]  diphenhydrAMINE (BENADRYL) 50 MG/ML injection  04/10/22   [provider]  DULoxetine (CYMBALTA) 30 MG capsule Take 30 mg by mouth every morning. Take with a 60 mg capsule for a total morning dose of 90 mg    [provider]  DULoxetine (CYMBALTA) 60 MG capsule Take 60 mg by mouth every morning. Take with a 30 mg capsule for a total morning dose of 90 mg 04/12/18   [provider]  fluticasone (FLONASE) 50 MCG/ACT nasal spray Instill 2 puffs each nostril every night. Patient not taking: Reported on 07/17/2022    [provider]  furosemide (LASIX) 20 MG tablet Take 20 mg by mouth every other day. Not taking on weekends    [provider]  glimepiride (AMARYL) 4 MG tablet Take 1 tablet by mouth 2 (two) times daily.    [provider]  guaiFENesin-codeine (ROBITUSSIN AC) 100-10 MG/5ML syrup take 24m BY MOUTH EVERY 4 HOURS AS NEEDED 06/10/22   [name], MD  HYDROcodone-acetaminophen (NORCO/VICODIN) 5-325 MG tablet Take 1 tablet by mouth every 4 (four) hours as needed. 10/08/21   [provider]  hydrOXYzine (VISTARIL) 25 MG capsule Take 25 mg by mouth at bedtime as needed for itching (sleep). 01/04/21   [provider]  ibuprofen (ADVIL) 600 MG tablet     [provider]  insulin detemir (LEVEMIR FLEXPEN) 100 UNIT/ML FlexPen INJECT 48 UNITS SUBCUTANEOUSLY ONCE DAILY    [provider]  Insulin Glargine (BASAGLAR KWIKPEN) 100 UNIT/ML SOPN Inject 40 Units into the skin daily.    [provider]  insulin lispro (HUMALOG) 100 UNIT/ML KwikPen Inject 14 Units into the skin 3 (three) times daily.    [provider]  linagliptin (TRADJENTA) 5 MG TABS tablet Take 1 tablet by mouth  every morning.    [provider]  MAGNESIUM PO Take 350 mg by mouth every evening.    [provider]  Multiple Vitamin (MULTIVITAMIN WITH MINERALS) TABS tablet Take 1 tablet by mouth every morning.    [provider]  nitroGLYCERIN (NITROSTAT) 0.4 MG SL tablet Place 0.4 mg under the tongue every 5 (five) minutes as needed for chest pain.  [provider]  OCTAGAM 20 GM/200ML SOLN Inject into the vein. 05/07/22   [provider]  OCTAGAM 5 GM/50ML SOLN Inject into the vein. 04/10/22   [provider]  ondansetron (ZOFRAN-ODT) 4 MG disintegrating tablet Take 4 mg by mouth every 8 (eight) hours as needed for nausea or vomiting.    [provider]  pantoprazole (PROTONIX) 40 MG tablet TAKE 1 TABLET BY MOUTH EVERY DAY 11/25/19   [name], MD  pioglitazone (ACTOS) 45 MG tablet Take 1 tablet by mouth daily.    [provider]  promethazine (PHENERGAN) 25 MG tablet TAKE 1 TABLET BY MOUTH EVERY 6 HOURS AS NEEDED FOR NAUSEA FOR 10 DAYS    [provider]  pyridostigmine (MESTINON) 60 MG tablet 1 tablet Patient not taking: Reported on 07/17/2022    [provider]  pyridostigmine (MESTINON) 60 MG tablet Take by mouth. Patient not taking: Reported on 07/17/2022 03/04/22   [provider]  valsartan (DIOVAN) 160 MG tablet Take 1 tablet by mouth daily.    [provider]  zaleplon ([name]) 5 MG capsule TAKE ONE CAPSULE BY MOUTH everyday AT bedtime Patient not taking: Reported on 07/17/2022 04/16/22   Deneise Lever, MD    Physical Exam: Vitals:   08/12/22 1116 08/12/22 1117 08/12/22 1122 08/12/22 1123  BP: 138/84  134/78   Pulse: 93  93   Resp: 16  15   Temp: 98.2 F (36.8 C)  98.2 F (36.8 C)   TempSrc: Oral  Oral   SpO2: 92%  93%   Weight:  79.4 kg  79.4 kg  Height:  '5\' 6"'$  (1.676 m)  '5\' 6"'$  (1.676 m)    Physical Exam Constitutional:      General: He is not in acute distress.     Appearance: Normal appearance.  HENT:     Head: Normocephalic and atraumatic.     Mouth/Throat:     Mouth: Mucous membranes are moist.     Pharynx: Oropharynx is clear.  Eyes:     Extraocular Movements: Extraocular movements intact.     Pupils: Pupils are equal, round, and reactive to light.  Cardiovascular:     Rate and Rhythm: Normal rate and regular rhythm.     Pulses: Normal pulses.     Heart sounds: Normal heart sounds.  Pulmonary:     Effort: Pulmonary effort is normal. No respiratory distress.     Breath sounds: Rales (Trace) present.  Abdominal:     General: Bowel sounds are normal. There is no distension.     Palpations: Abdomen is soft.     Tenderness: There is no abdominal tenderness.  Musculoskeletal:        General: No swelling or deformity.  Skin:    General: Skin is warm and dry.  Neurological:     Mental Status: He is oriented to person, place, and time.     Motor: Weakness (Generalized) present.    Labs on Admission: I have personally reviewed following labs and imaging studies  CBC: Recent Labs  Lab 08/12/22 1117  WBC 8.7  NEUTROABS 6.6  HGB 13.6  HCT 42.8  MCV 92.2  PLT 161    Basic Metabolic Panel: Recent Labs  Lab 08/12/22 1117  NA 137  K 4.2  CL 104  CO2 23  GLUCOSE 174*  BUN 25*  CREATININE 1.84*  CALCIUM 8.9    GFR: Estimated Creatinine Clearance: 32.8 mL/min (A) (by C-G formula based on SCr of  1.84 mg/dL (H)).  Liver Function Tests: Recent Labs  Lab 08/12/22 1117  AST 27  ALT 45*  ALKPHOS 99  BILITOT 0.9  PROT 5.0*  ALBUMIN 2.3*    Urine analysis:    Component Value Date/Time   COLORURINE YELLOW 01/29/2020 2234   APPEARANCEUR CLEAR 01/29/2020 2234   LABSPEC 1.016 01/29/2020 2234   PHURINE 6.0 01/29/2020 [place] 01/29/2020 2234   [place] 01/29/2020 2234   [place] 01/29/2020 2234   KETONESUR NEGATIVE 01/29/2020 2234   PROTEINUR 100 (A) 01/29/2020 2234   UROBILINOGEN 0.2  10/02/2009 1604   NITRITE NEGATIVE 01/29/2020 2234   LEUKOCYTESUR NEGATIVE 01/29/2020 2234    Radiological Exams on Admission: CT Head Wo Contrast  Result Date: 08/12/2022 CLINICAL DATA:  Trauma EXAM: CT HEAD WITHOUT CONTRAST TECHNIQUE: Contiguous axial images were obtained from the base of the skull through the vertex without intravenous contrast. RADIATION DOSE REDUCTION: This exam was performed according to the departmental dose-optimization program which includes automated exposure control, adjustment of the mA and/or kV according to patient size and/or use of iterative reconstruction technique. COMPARISON:  06/17/22 CT Brain FINDINGS: Brain: No evidence of acute infarction, hemorrhage, hydrocephalus, extra-axial collection or mass lesion/mass effect. Sequela mild chronic microvascular ischemic change. Mineralization of the basal ganglia. Vascular: No hyperdense vessel or unexpected calcification. Vascular calcification of the carotid siphons bilaterally. Skull: Normal. Negative for fracture or focal lesion. Sinuses/Orbits: Mucosal thickening right maxillary sinus. Bilateral lens replacement. Other: None. IMPRESSION: No evidence of intracranial injury. Electronically Signed   By: Marin Roberts M.D.   On: 08/12/2022 12:06    EKG: Independently reviewed.  Sinus rhythm at 87 bpm.  Right bundle branch block.  Nonspecific T wave changes.  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 > Does have history of myasthenia gravis and has had falls in the past due to this but they have become much more frequent in the past 2 days. > He is followed by clinic in [place] whose records we do not currently have access to.  Process of obtaining these records ongoing.  Chart review shows he has been previously on pyridostigmine and appears to currently be receiving Octagam injections > Currently primary symptoms of generalized weakness.  No shortness of breath.  CT head without acute normality. > Case has been discussed with neurology by EDP and neurology will see the patient. - Monitor on telemetry - Appreciate neurology recommendations - Trend NIF and vital capacity - PT OT eval and treat  - Supportive care  Intertrigo > Patient recently started on nystatin powder for intertrigo in the groin.  States he has been on this for about a day. > When pharmacy was discussing medications with patient's wife she raise concern about this if it could be related to his current issues. - Continue nystatin powder  CAD HLD > Status post CABG > Not currently on beta-blocker due to bradycardia and not currently on ACE/ARB due to CKD - Continue home atorvastatin and Eliquis  CKD 3B > Creatinine stable at 1.4 in ED. - Trend renal function and electrolytes   Chronic diastolic CHF > Last echo was in 2022 and showed EF 60-65%, grade 1 DD and mildly reduced RV function. - Continue home Lasix   Atrial fibrillation > Not currently in A-fib in the ED. - Continue home Eliquis   Hypertension - Continue home Lasix as above  Diabetes > On 40 units along acting at home with 8 units with meals. - Continue with 30 units of long-acting insulin daily - SSI  GERD - Continue PPI  Anxiety Depression - Continue home  BuSpar, duloxetine,.  As needed hydroxyzine  PAD - On atorvastatin as above - Continue home cilostazol  OSA - Not currently on CPAP  DVT prophylaxis: Eliquis Code Status:   Full Family Communication:  None on admission. Disposition Plan:   Patient is from:  Home  Anticipated DC to:  Home  Anticipated DC date:  1 to 3 days  Anticipated DC barriers: None  Consults called:  Neurology Admission status:  Observation, telemetry  Severity of Illness: The appropriate patient status for this patient is OBSERVATION. Observation status is judged to be reasonable and necessary in order to provide the required intensity of service to ensure the patient's safety. The patient's presenting symptoms, physical exam findings, and initial radiographic and laboratory data in the context of their medical condition is felt to place them at decreased risk for further clinical deterioration. Furthermore, it is anticipated that the patient will be medically stable for discharge from the hospital within 2 midnights of admission.    Marcelyn Bruins MD [facility]  How to contact the [facility] Attending or Consulting provider [place] or covering provider during after hours [place], for this patient?   Check the care team in [facility] and look for a) attending/consulting [facility] provider listed and b) the [facility] team listed Log into [email or web address] and use [place]'s universal password to access. If you do not have the password, please contact the hospital operator. Locate the [facility] provider you are looking for under [facility] and page to a number that you can be directly reached. If you still have difficulty reaching the provider, please page the [facility] (Director on Call) for the Hospitalists listed on amion for assistance.  08/12/2022, 2:40 PM

## 2022-08-12 NOTE — ED Triage Notes (Signed)
Pt with Hx os MSG. Report multiple falls in the past 2 days. [facility] was called home twice yesterday but pt refused transport. Today he felt again and hit his heat with trash can. No visible trauma to the head. Pt on Eliquis. Aox4. Denies pain

## 2022-08-12 NOTE — ED Provider Notes (Signed)
[facility] EMERGENCY DEPARTMENT Provider Note   CSN: [id number] Arrival date & time: 08/12/22  1109     History  Chief Complaint  Patient presents with   Fall    On thinners     Fernando Hess is a 76 y.o. male.  76 yo M with a chief complaints of a fall.  Patient has been having frequent falls over the past week or so.  He tells me this is not terribly uncommon for him.  He is suffering with myasthenia gravis and typically uses a walker.  He said despite that he has suffered more falls than normal.  He otherwise denies any significant changes denies cough congestion or fever denies abdominal pain feels he has been eating and drinking normally.  Was on an antifungal agent at some point but he does not really remember being on it.        Home Medications Prior to Admission medications   Medication Sig Start Date End Date Taking? Authorizing Provider  acetaminophen (TYLENOL) 325 MG tablet Take 325 mg by mouth every 6 (six) hours as needed for headache (pain).    [provider]  acetaminophen-codeine (TYLENOL #3) 300-30 MG tablet Take 1 tablet by mouth every 6 (six) hours as needed.    [provider]  ALPRAZolam Duanne Moron) 0.25 MG tablet Take for anxiety 1 hour before PFT 10/22/21   Deneise Lever, MD  apixaban (ELIQUIS) 5 MG TABS tablet TAKE ONE TABLET BY MOUTH EVERY MORNING and TAKE ONE TABLET BY MOUTH EVERY EVENING 03/13/22   Freada Bergeron, MD  atorvastatin (LIPITOR) 80 MG tablet Take 1 tablet (80 mg total) by mouth daily. 11/07/21 02/05/22  [name], [name]  B-D UF III MINI PEN NEEDLES 31G X 5 MM MISC USE AS DIRECTED WITH LANTUS SOLOSTAR 04/02/19   [provider]  busPIRone (BUSPAR) 10 MG tablet Take 1 tablet by mouth daily.    [provider]  cilostazol (PLETAL) 50 MG tablet Take 1 tablet by mouth 2 (two) times daily.    [provider]  Continuous Blood Gluc Sensor (FREESTYLE LIBRE 2 SENSOR) MISC replace  sensor every TWO WEEKS 11/29/21   [provider]  diphenhydrAMINE (BENADRYL) 50 MG/ML injection  04/10/22   [provider]  DULoxetine (CYMBALTA) 30 MG capsule Take 30 mg by mouth every morning. Take with a 60 mg capsule for a total morning dose of 90 mg    [provider]  DULoxetine (CYMBALTA) 60 MG capsule Take 60 mg by mouth every morning. Take with a 30 mg capsule for a total morning dose of 90 mg 04/12/18   [provider]  fluticasone (FLONASE) 50 MCG/ACT nasal spray Instill 2 puffs each nostril every night. Patient not taking: Reported on 07/17/2022    [provider]  furosemide (LASIX) 20 MG tablet Take 20 mg by mouth every other day. Not taking on weekends    [provider]  glimepiride (AMARYL) 4 MG tablet Take 1 tablet by mouth 2 (two) times daily.    [provider]  guaiFENesin-codeine (ROBITUSSIN AC) 100-10 MG/5ML syrup take 65m BY MOUTH EVERY 4 HOURS AS NEEDED 06/10/22   [name], MD  HYDROcodone-acetaminophen (NORCO/VICODIN) 5-325 MG tablet Take 1 tablet by mouth every 4 (four) hours as needed. 10/08/21   [provider]  hydrOXYzine (VISTARIL) 25 MG capsule Take 25 mg by mouth at bedtime as needed for itching (sleep). 01/04/21   [provider]  ibuprofen (ADVIL) 600 MG tablet     [provider]  insulin detemir (LEVEMIR FLEXPEN) 100 UNIT/ML FlexPen INJECT 48 UNITS SUBCUTANEOUSLY ONCE DAILY    [provider]  Insulin Glargine (BASAGLAR KWIKPEN) 100 UNIT/ML SOPN Inject 40 Units into the skin daily.    [provider]  insulin lispro (HUMALOG) 100 UNIT/ML KwikPen Inject 14 Units into the skin 3 (three) times daily.    [provider]  linagliptin (TRADJENTA) 5 MG TABS tablet Take 1 tablet by mouth every morning.    [provider]  MAGNESIUM PO Take 350 mg by mouth every evening.    [provider]  Multiple Vitamin (MULTIVITAMIN WITH  MINERALS) TABS tablet Take 1 tablet by mouth every morning.    [provider]  nitroGLYCERIN (NITROSTAT) 0.4 MG SL tablet Place 0.4 mg under the tongue every 5 (five) minutes as needed for chest pain.    [provider]  OCTAGAM 20 GM/200ML SOLN Inject into the vein. 05/07/22   [provider]  OCTAGAM 5 GM/50ML SOLN Inject into the vein. 04/10/22   [provider]  ondansetron (ZOFRAN-ODT) 4 MG disintegrating tablet Take 4 mg by mouth every 8 (eight) hours as needed for nausea or vomiting.    [provider]  pantoprazole (PROTONIX) 40 MG tablet TAKE 1 TABLET BY MOUTH EVERY DAY 11/25/19   [name], MD  pioglitazone (ACTOS) 45 MG tablet Take 1 tablet by mouth daily.    [provider]  promethazine (PHENERGAN) 25 MG tablet TAKE 1 TABLET BY MOUTH EVERY 6 HOURS AS NEEDED FOR NAUSEA FOR 10 DAYS    [provider]  pyridostigmine (MESTINON) 60 MG tablet 1 tablet Patient not taking: Reported on 07/17/2022    [provider]  pyridostigmine (MESTINON) 60 MG tablet Take by mouth. Patient not taking: Reported on 07/17/2022 03/04/22   [provider]  valsartan (DIOVAN) 160 MG tablet Take 1 tablet by mouth daily.    [provider]  zaleplon ([name]) 5 MG capsule TAKE ONE CAPSULE BY MOUTH everyday AT bedtime Patient not taking: Reported on 07/17/2022 04/16/22   Deneise Lever, MD      Allergies    Cilostazol, Dulaglutide, Levofloxacin, Liraglutide, and Lisinopril    Review of Systems   Review of Systems  Physical Exam Updated Vital Signs BP 134/78 (BP Location: Right Arm)   Pulse 93   Temp 98.2 F (36.8 C) (Oral)   Resp 15   Ht '5\' 6"'$  (1.676 m)   Wt 79.4 kg   SpO2 93%   BMI 28.25 kg/m  Physical Exam Vitals and nursing note reviewed.  Constitutional:      Appearance: He is well-developed.  HENT:     Head: Normocephalic and atraumatic.  Eyes:     Pupils: Pupils are equal, round, and reactive to  light.  Neck:     Vascular: No JVD.  Cardiovascular:     Rate and Rhythm: Normal rate and regular rhythm.     Heart sounds: No murmur heard.    No friction rub. No gallop.  Pulmonary:     Effort: No respiratory distress.     Breath sounds: No wheezing.  Abdominal:     General: There is no distension.     Tenderness: There is no abdominal tenderness. There is no guarding or rebound.  Musculoskeletal:        General: Normal range of motion.     Cervical back: Normal range  of motion and neck supple.     Comments: Palpated from head to toe without any noted areas of bony tenderness.  Truncal ataxia upon sitting.  Skin:    Coloration: Skin is not pale.     Findings: No rash.  Neurological:     Mental Status: He is alert and oriented to person, place, and time.  Psychiatric:        Behavior: Behavior normal.     ED Results / Procedures / Treatments   Labs (all labs ordered are listed, but only abnormal results are displayed) Labs Reviewed  CBC WITH DIFFERENTIAL/PLATELET - Abnormal; Notable for the following components:      Result Value   RDW 16.5 (*)    Abs Immature Granulocytes 0.09 (*)    All other components within normal limits  COMPREHENSIVE METABOLIC PANEL - Abnormal; Notable for the following components:   Glucose, Bld 174 (*)    BUN 25 (*)    Creatinine, Ser 1.84 (*)    Total Protein 5.0 (*)    Albumin 2.3 (*)    ALT 45 (*)    GFR, Estimated 37 (*)    All other components within normal limits  CBG MONITORING, ED    EKG EKG Interpretation  Date/Time:  Tuesday August 12 2022 11:46:30 EST Ventricular Rate:  87 PR Interval:  130 QRS Duration: 125 QT Interval:  403 QTC Calculation: 485 R Axis:   22 Text Interpretation: Sinus rhythm Right bundle branch block No significant change since last tracing Confirmed by Deno Etienne [phone]) on 08/12/2022 12:05:23 PM  Radiology CT Head Wo Contrast  Result Date: 08/12/2022 CLINICAL DATA:  Trauma EXAM: CT HEAD WITHOUT  CONTRAST TECHNIQUE: Contiguous axial images were obtained from the base of the skull through the vertex without intravenous contrast. RADIATION DOSE REDUCTION: This exam was performed according to the departmental dose-optimization program which includes automated exposure control, adjustment of the mA and/or kV according to patient size and/or use of iterative reconstruction technique. COMPARISON:  06/17/22 CT Brain FINDINGS: Brain: No evidence of acute infarction, hemorrhage, hydrocephalus, extra-axial collection or mass lesion/mass effect. Sequela mild chronic microvascular ischemic change. Mineralization of the basal ganglia. Vascular: No hyperdense vessel or unexpected calcification. Vascular calcification of the carotid siphons bilaterally. Skull: Normal. Negative for fracture or focal lesion. Sinuses/Orbits: Mucosal thickening right maxillary sinus. Bilateral lens replacement. Other: None. IMPRESSION: No evidence of intracranial injury. Electronically Signed   By: Marin Roberts M.D.   On: 08/12/2022 12:06    Procedures Procedures    Medications Ordered in ED Medications  furosemide (LASIX) tablet 20 mg (has no administration in time range)  apixaban (ELIQUIS) tablet 5 mg (has no administration in time range)  sodium chloride flush (NS) 0.9 % injection 3 mL (has no administration in time range)  acetaminophen (TYLENOL) tablet 650 mg (has no administration in time range)    Or  acetaminophen (TYLENOL) suppository 650 mg (has no administration in time range)  polyethylene glycol (MIRALAX / GLYCOLAX) packet 17 g (has no administration in time range)  acetaminophen (TYLENOL) tablet 1,000 mg (1,000 mg Oral Given 08/12/22 1219)  oxyCODONE (Oxy IR/ROXICODONE) immediate release tablet 2.5 mg (2.5 mg Oral Given 08/12/22 1219)    ED Course/ Medical Decision Making/ A&P                           Medical Decision Making Amount and/or Complexity of Data Reviewed Labs: ordered. Radiology:  ordered. ECG/medicine tests:  ordered.  Risk OTC drugs. Prescription drug management. Decision regarding hospitalization.   76 yo M with a significant past medical history of myasthenia gravis comes in with a chief complaint of frequent falls over the past couple weeks.  He tells me that he is on 2 injectable medications for this was not sure what they are.  He fell earlier today and struck his head and decided to come in to be evaluated.  CT scan of the head independently interpreted by me without obvious intercranial hemorrhage.  Basic blood work here without significant electrolyte abnormality.  No anemia.  I discussed the results with the patient.  He is concerned about difficulty going home with frequent falls.  Will discuss with neurology.  I discussed the case with Dr. Curly Shores, plans to see the patient.  I will try and obtain medical records from his neurology office.  Patient is unable to ambulates and has severe ataxia even upon sitting will discuss with medicine for admission.  The patients results and plan were reviewed and discussed.   Any x-rays performed were independently reviewed by myself.   Differential diagnosis were considered with the presenting HPI.  Medications  furosemide (LASIX) tablet 20 mg (has no administration in time range)  apixaban (ELIQUIS) tablet 5 mg (has no administration in time range)  sodium chloride flush (NS) 0.9 % injection 3 mL (has no administration in time range)  acetaminophen (TYLENOL) tablet 650 mg (has no administration in time range)    Or  acetaminophen (TYLENOL) suppository 650 mg (has no administration in time range)  polyethylene glycol (MIRALAX / GLYCOLAX) packet 17 g (has no administration in time range)  acetaminophen (TYLENOL) tablet 1,000 mg (1,000 mg Oral Given 08/12/22 1219)  oxyCODONE (Oxy IR/ROXICODONE) immediate release tablet 2.5 mg (2.5 mg Oral Given 08/12/22 1219)    Vitals:   08/12/22 1116 08/12/22 1117 08/12/22 1122  08/12/22 1123  BP: 138/84  134/78   Pulse: 93  93   Resp: 16  15   Temp: 98.2 F (36.8 C)  98.2 F (36.8 C)   TempSrc: Oral  Oral   SpO2: 92%  93%   Weight:  79.4 kg  79.4 kg  Height:  '5\' 6"'$  (1.676 m)  '5\' 6"'$  (1.676 m)    Final diagnoses:  Weakness    Admission/ observation were discussed with the admitting physician, patient and/or family and they are comfortable with the plan.         Final Clinical Impression(s) / ED Diagnoses Final diagnoses:  Weakness    Rx / DC Orders ED Discharge Orders     None         Deno Etienne, DO 08/12/22 1442

## 2022-08-12 NOTE — ED Notes (Signed)
Patient transported to MRI 

## 2022-08-12 NOTE — ED Notes (Signed)
Dr Trilby Drummer notified of patient's CBG of >400

## 2022-08-12 NOTE — Consult Note (Addendum)
Neurology Consultation Reason for Consult: Frequent falls  Requesting Physician: Neva Seat  CC: Falling a lot, shortness of breath   History is obtained from: Chart review (neurology records that not within our EMR and office unable to be reached), patient (somewhat unreliable) and daughter (estranged from father, recently reinvolved in the past 2 weeks due to his worsening medical condition)  HPI: Fernando Hess is a 76 y.o. 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  He does not feel refreshed/stronger after naps.  He has not had any ocular symptoms, but he has had increased difficulty swallowing.  Family does feel that he is cognitively not as sharp as they would expect  History is extremely challenging as he reports he takes all of his medications per "doctors orders" but he does not know why they are ordered, what conditions they are supposed to treat etc.  Family believes gabapentin was started recently 2 to 3 weeks ago, uncertain of dosage and uncertain of reason, as is the patient.  Per medication reconciliation possibly 300 mg once daily for 7 days plan to increase to 300 twice a day after 7 days, then 300 in the morning and 600 in the evening.    Addendum: Was able to review most recent neurology note.  This indicates that myasthenia gravis workup is included antibody testing for acetylcholine receptor antibody, musk, LRP 4 all negative with a negative CT chest.  Exacerbations have felt to be related to shortness of breath and difficulty breathing and swallowing but the patient was noted to have failed Mestinon, prednisone and IVIG  with plan to start Ultomiris (Ravulizumab, and anti-C5 complement system antibody).  Neurological examination 07/30/2022 was notable for normal mental status, patchy weakness, ataxic gait, facial diplegia, peripheral neuropathy, and hypoactive reflexes with 1+ jaw jerk.  Notes confirm the gabapentin was added for neuropathic pain (lumbosacral radiculopathy), and trigger point injections for acute neck pain  He was most recently seen by cardiology 07/17/2022 and noted that he has been struggling with episodes of falls as well as shortness of breath and dizziness.  They note multiple years of workup for his symptoms that were fairly unrevealing.  For example in August 2022 he had normal EF mildly elevated BNP and beta-blocker was discontinued with initiation of as needed furosemide and note of orthostatic hypotension.  Subsequently right heart cath demonstrated low filling pressures and he was asked to stop taking furosemide to daily.  Event monitor demonstrated frequent asymptomatic PACs and short runs of SVT without A-fib or pauses that he declined loop monitor.  He had an elevated BNP in October 2022 of 627.  He had a normal Myoview in December 2022 and a CPET study which was limited due to submaximal effort but demonstrated normal functional capacity and due to unrevealing pulmonary GI workup he was referred to neurology  Per review of records in August 2023 he was started on IV therapy by [facility] neurology for myasthenia gravis, likely IVIG, unclear when his last dose was.  He does not feel that any of the treatments for myasthenia he has received has made any improvement in his symptoms  There is a chart mention of pyridostigmine but he is not sure he is  taking this medication  ROS: No fevers, no bowel or bladder changes, no constipation, no sensory changes  Past Medical History:  Diagnosis Date   Acute renal failure (ARF) ([place]) 09/24/2015   Acute stress disorder 11/13/2021   Atrial premature  complexes    CAD (coronary artery disease) of artery bypass graft    Early occlusion of saphenous vein graft to intermediate and marginal branch in February 2007 following bypass grafting    CAD (coronary artery disease), native coronary artery 2017   hx NSTEMI 09-24-2015  s/p  CABG x5 on 10-02-2015;  post op STEMI inferolateral wall,  SVG OM1 and SVG OM2 occluded, distal OM occlusion the calpruit, treated medically // Myoview 7/21: no ischemia, EF 65, low risk   CHF (congestive heart failure) ([name])    CKD (chronic kidney disease), stage III ([name])    Contusion of right knee 03/16/2020   Dyspnea    Elevated troponin    Erectile dysfunction    Esophageal reflux    History of atrial fibrillation    post op CABG 10-02-2015   History of kidney stones    History of non-ST elevation myocardial infarction (NSTEMI) 09/24/2015   s/p  CABG x5   History of ST elevation myocardial infarction (STEMI) 10/22/2015   inferior wall,  post op CABG 10-02-2015   Hyperlipidemia    Hypertension    Left ureteral stone    Mild atherosclerosis of both carotid arteries    Nephrolithiasis    per CT bilateral non-obstructive calculi   OSA (obstructive sleep apnea)    Peripheral artery disease    LE Arterial US 01/2019: R PTA and ATA occluded; L ATA occluded   RBBB (right bundle branch block)    Renal atrophy, right    Sleep apnea    wears cpap    ST elevation myocardial infarction (STEMI) of inferior wall ([place]) 10/22/2015   Type 2 diabetes mellitus treated with insulin ([place])    followed by pcp   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 ([place]) 03/01/2008   Wears glasses    Past Surgical History:  Procedure Laterality Date   [place] N/A 09/26/2015   Procedure: Left Heart Cath and Coronary Angiography;  Surgeon: Troy Sine, MD;  Location: [place] [facility];  Service: Cardiovascular;  Laterality: N/A;    CARDIAC CATHETERIZATION N/A 10/22/2015   Procedure: Left Heart Cath and Coronary Angiography;  Surgeon: Sherren Mocha, MD;  Location: [place] [facility];  Service: Cardiovascular;  Laterality: N/A;   CATARACT EXTRACTION W/ INTRAOCULAR LENS  IMPLANT, BILATERAL  2017   COLONOSCOPY     CORONARY ARTERY BYPASS GRAFT N/A 10/02/2015   Procedure: CORONARY ARTERY BYPASS GRAFTING (CABG) X5 LIMA-LAD; SVG-DIAG; SVG-OM; SVG-PD; SVG-RAMUS TRANSESOPHAGEAL ECHOCARDIOGRAM (TEE) ENDOSCOPIC GREATER SAPHENOUS VEIN  HARVEST BILAT LE;  Surgeon: Ivin Poot, MD;  Location: [place];  Service: Open Heart Surgery;  Laterality: N/A;   CYSTOSCOPY/URETEROSCOPY/HOLMIUM LASER/STENT PLACEMENT Left 08/10/2018   Procedure: CYSTOSCOPY/URETEROSCOPY/HOLMIUM LASER/STENT PLACEMENT;  Surgeon: Festus Aloe, MD;  Location: [facility];  Service: Urology;  Laterality: Left;   CYSTOSCOPY/URETEROSCOPY/HOLMIUM LASER/STENT PLACEMENT Left 09/10/2018   Procedure: CYSTOSCOPY/URETEROSCOPY/HOLMIUM LASER/STENT EXCHANGE;  Surgeon: Festus Aloe, MD;  Location: [facility];  Service: Urology;  Laterality: Left;   LEFT HEART CATHETERIZATION WITH CORONARY ANGIOGRAM N/A 04/13/2014   Procedure: LEFT HEART CATHETERIZATION WITH CORONARY ANGIOGRAM;  Surgeon: Jacolyn Reedy, MD;  Location: [facility];  Service: Cardiovascular;  Laterality: N/A;   LEG SURGERY Right age 13   closed reduction leg fracture   NASAL SEPTOPLASTY W/ TURBINOPLASTY Bilateral 08/30/2021   Procedure: NASAL SEPTOPLASTY WITH BILATERAL INFERIOR TURBINATE REDUCTION;  Surgeon: Jerrell Belfast, MD;  Location: [place];  Service: ENT;  Laterality: Bilateral;   POLYPECTOMY     RIGHT HEART CATH N/A 06/06/2021   Procedure: RIGHT HEART CATH;  Surgeon: Jolaine Artist, MD;  Location: [place] [facility];  Service: Cardiovascular;  Laterality: N/A;   TEE WITHOUT CARDIOVERSION N/A 10/02/2015   Procedure: TRANSESOPHAGEAL ECHOCARDIOGRAM (TEE);  Surgeon: Ivin Poot, MD;   Location: [place];  Service: Open Heart Surgery;  Laterality: N/A;   URETEROSCOPY WITH HOLMIUM LASER LITHOTRIPSY Bilateral 2004;  2005  dr Risa Grill  '@[email or web address]'$    VASECTOMY     Current Outpatient Medications  Medication Instructions   acetaminophen (TYLENOL) 325-650 mg, Oral, Every 6 hours PRN   apixaban (ELIQUIS) 5 MG TABS tablet TAKE ONE TABLET BY MOUTH EVERY MORNING and TAKE ONE TABLET BY MOUTH EVERY EVENING   atorvastatin (LIPITOR) 80 mg, Oral, Daily   B-D UF III MINI PEN NEEDLES 31G X 5 MM MISC USE AS DIRECTED WITH LANTUS SOLOSTAR   Basaglar KwikPen 40 Units, Subcutaneous, Every morning   busPIRone (BUSPAR) 10 MG tablet 1 tablet, Daily   cilostazol (PLETAL) 50 MG tablet 1 tablet, 2 times daily   Continuous Blood Gluc Sensor (FREESTYLE LIBRE 2 SENSOR) MISC 1 Device, Subcutaneous, Every 14 days   diphenhydrAMINE (BENADRYL ALLERGY) 25 mg, Oral, Every 6 hours PRN   DULoxetine (CYMBALTA) 60 mg, Oral, Every morning   DULoxetine (CYMBALTA) 30 mg, Oral, Every morning   furosemide (LASIX) 20 mg, Oral, Every M-W-F   gabapentin (NEURONTIN) 300-600 mg, Oral, See admin instructions, Take 300 mg by mouth once daily for 7 days then 300 mg twice daily for 7 days then increase to 300 mg in the morning & 600 mg in the evening (thereafter)   guaiFENesin-codeine (ROBITUSSIN AC) 100-10 MG/5ML syrup take 26m BY MOUTH EVERY 4 HOURS AS NEEDED   insulin lispro (HUMALOG) 8 Units, Subcutaneous, 3 times daily with meals   MAGNESIUM PO 350 mg, Oral, Every evening   Multiple Vitamins-Minerals (SENIOR MULTIVITAMIN PLUS PO) 1 tablet, Oral, Daily with breakfast   nitroGLYCERIN (NITROSTAT) 0.4 mg, Sublingual, Every 5 min PRN   nystatin (MYCOSTATIN/NYSTOP) powder 1 Application, Topical, See admin instructions, Apply to the groin in the morning and evening   nystatin cream (MYCOSTATIN) 1 Application, Topical, See admin instructions, Apply to the groin in the morning and evening when not using the nystatin powder    OCTAGAM 20  GM/200ML SOLN Intravenous   ondansetron (ZOFRAN-ODT) 4 mg, Oral, Every 8 hours PRN   pantoprazole (PROTONIX) 40 MG tablet TAKE 1 TABLET BY MOUTH EVERY DAY   pyridostigmine (MESTINON) 60 mg, 3 times daily   tiZANidine (ZANAFLEX) 2 mg, Oral, Every 8 hours PRN     Family History  Problem Relation Age of Onset   Heart attack Mother    Heart attack Father    Diabetes Brother    Pancreatic cancer Brother    Diabetes Brother    Colon cancer Neg Hx    Esophageal cancer Neg Hx    Prostate cancer Neg Hx    Rectal cancer Neg Hx    Stomach cancer Neg Hx    Colon polyps Neg Hx     Social History:  reports that he has never smoked. He has never  used smokeless tobacco. He reports that he does not currently use alcohol. He reports that he does not use drugs.   Exam: Current vital signs: BP 137/74   Pulse 90   Temp 98.7 F (37.1 C)   Resp 18   Ht '5\' 6"'$  (1.676 m)   Wt 79.4 kg   SpO2 99%   BMI 28.25 kg/m  Vital signs in last 24 hours: Temp:  [97.9 F (36.6 C)-98.7 F (37.1 C)] 98.7 F (37.1 C) (11/28 1846) Pulse Rate:  [90-93] 90 (11/28 1846) Resp:  [15-18] 18 (11/28 1846) BP: (134-138)/(74-84) 137/74 (11/28 1846) SpO2:  [92 %-99 %] 99 % (11/28 1846) Weight:  [79.4 kg] 79.4 kg (11/28 1123)   Physical Exam  Constitutional: Appears well-developed and well-nourished.  Psych: Affect calm and cooperative Eyes: No scleral injection HENT: No oropharyngeal obstruction.  MSK: no joint deformities.  Cardiovascular: Perfusing extremities well Respiratory: Appears slightly short of breath at rest  GI: Soft.  No distension. There is no tenderness.  Skin: Abrasions of the bilateral knees, significant bruising of the left foot  Neuro: Mental Status: Patient is awake, alert, oriented to person, place, month, year, and situation. Patient gives [place] some history but at times is also very tangential, possibly apathetic about the details of his health history or masking inability to remember  details Does have some difficulty naming low-frequency objects such as face of the watch and band of the watch, hesitates to name lenses as well.  At times scanning nature to his speech Cranial Nerves: II: Visual Fields are full. Pupils are equal, round, and reactive to light.   III,IV, VI: EOMI without ptosis or diploplia.  Saccadic pursuits V: Facial sensation is symmetric to light touch VII: Facial movement is symmetric.  VIII: hearing is intact to voice X: Uvula elevates symmetrically XI: Shoulder shrug is symmetric. XII: tongue is midline without atrophy or fasciculations.  Motor: Neck flexion 4 -/5  5/5 strength was present in all four extremities.  Notably there is no clear proximal muscle weakness, there is no clear fatigability Sensory: Sensation is symmetric to light touch and temperature in the arms and legs.  He does have a length dependent loss of temperature sensation in the arms and legs and vibration sensation is absent at the great toes bilaterally. Deep Tendon Reflexes: 2+ right brachioradialis, 3+ left brachioradialis, 2+ brisk in the right knee, 3+ in the left knee.  No ankle clonus Cerebellar: Finger-nose is intact bilaterally, heel-to-shin demonstrates some ataxia.  He does have some tremor of the bilateral upper extremities, slightly variable   I have reviewed labs in epic and the results pertinent to this consultation are:  Basic Metabolic Panel: Recent Labs  Lab 08/12/22 1117  NA 137  K 4.2  CL 104  CO2 23  GLUCOSE 174*  BUN 25*  CREATININE 1.84*  CALCIUM 8.9    CBC: Recent Labs  Lab 08/12/22 1117  WBC 8.7  NEUTROABS 6.6  HGB 13.6  HCT 42.8  MCV 92.2  PLT 222    Coagulation Studies: No results for input(s): "LABPROT", "INR" in the last 72 hours.    I have reviewed the images obtained:  Head CT personally reviewed, agree with radiology no clear acute intracranial process  Assessment: My evaluation today is not consistent with an acute  myasthenic flare, and I am uncertain of the evidence to support the diagnosis of myasthenia at this time.  Examination is most notable for some evidence of cognitive impairment, hyperreflexia even  in a background of significant neuropathy.  As such I will start workup by exploring structural causes for hyperreflexia, start a workup for reversible causes of altered mental status as well as myelopathy labs  Impression:  -Some evidence of cognitive impairment given limited ability to convey medical history and difficulty with naming low-frequency objects -Hyperreflexia in the lower extremities especially notable in the setting of his significant neuropathy which likely is masking even more severe hyperreflexia -Likelihood of recent IVIG treatment will confound any serological testing  Recommendations: -MRI brain, C-spine and T-spine without contrast -Reversible causes of altered mental status labs: B1, B12, MMA, HIV, RPR, TSH -Myelopathy labs: B12, copper, zinc -Appreciate primary team's assistance in obtaining outpatient neurology records -Neurology will follow along  Lesleigh Noe MD-PhD [facility] [phone] Available 7 AM to 7 PM, outside these hours please contact Neurologist on call listed on AMION

## 2022-08-13 ENCOUNTER — Observation Stay (HOSPITAL_COMMUNITY): Payer: Medicare Other

## 2022-08-13 DIAGNOSIS — R4182 Altered mental status, unspecified: Secondary | ICD-10-CM | POA: Diagnosis not present

## 2022-08-13 DIAGNOSIS — R531 Weakness: Secondary | ICD-10-CM | POA: Diagnosis not present

## 2022-08-13 LAB — COMPREHENSIVE METABOLIC PANEL
ALT: 47 U/L — ABNORMAL HIGH (ref 0–44)
AST: 34 U/L (ref 15–41)
Albumin: 2.4 g/dL — ABNORMAL LOW (ref 3.5–5.0)
Alkaline Phosphatase: 100 U/L (ref 38–126)
Anion gap: 12 (ref 5–15)
BUN: 26 mg/dL — ABNORMAL HIGH (ref 8–23)
CO2: 24 mmol/L (ref 22–32)
Calcium: 9 mg/dL (ref 8.9–10.3)
Chloride: 103 mmol/L (ref 98–111)
Creatinine, Ser: 1.76 mg/dL — ABNORMAL HIGH (ref 0.61–1.24)
GFR, Estimated: 39 mL/min — ABNORMAL LOW (ref >60)
Glucose, Bld: 70 mg/dL (ref 70–99)
Potassium: 4.8 mmol/L (ref 3.5–5.1)
Sodium: 139 mmol/L (ref 135–145)
Total Bilirubin: 0.6 mg/dL (ref 0.3–1.2)
Total Protein: 5 g/dL — ABNORMAL LOW (ref 6.5–8.1)

## 2022-08-13 LAB — FOLATE: Folate: 18.9 ng/mL (ref >5.9)

## 2022-08-13 LAB — AMMONIA: Ammonia: 30 umol/L (ref 9–35)

## 2022-08-13 LAB — TSH: TSH: 0.421 u[IU]/mL (ref 0.350–4.500)

## 2022-08-13 LAB — RPR: RPR Ser Ql: NONREACTIVE

## 2022-08-13 LAB — GLUCOSE, CAPILLARY
Glucose-Capillary: 246 mg/dL — ABNORMAL HIGH (ref 70–99)
Glucose-Capillary: 226 mg/dL — ABNORMAL HIGH (ref 70–99)

## 2022-08-13 LAB — CBG MONITORING, ED
Glucose-Capillary: 146 mg/dL — ABNORMAL HIGH (ref 70–99)
Glucose-Capillary: 320 mg/dL — ABNORMAL HIGH (ref 70–99)

## 2022-08-13 LAB — VITAMIN B12: Vitamin B-12: 736 pg/mL (ref 180–914)

## 2022-08-13 MED ORDER — MELATONIN 3 MG PO TABS
3.0000 mg | ORAL_TABLET | Freq: Every evening | ORAL | Status: DC | PRN
  Administered 2022-08-13 (×2): 3 mg via ORAL
  Filled 2022-08-13 (×2): qty 1

## 2022-08-13 MED ORDER — HYDROXYZINE HCL 25 MG PO TABS
25.0000 mg | ORAL_TABLET | Freq: Every evening | ORAL | Status: DC | PRN
  Administered 2022-08-13: 25 mg via ORAL
  Filled 2022-08-13: qty 1

## 2022-08-13 NOTE — Progress Notes (Signed)
  Progress Note   Patient: Fernando Hess TFT:[id number] DOB: 11-13-1945 DOA: 08/12/2022     0 DOS: the patient was seen and examined on 08/13/2022 at 8:36AM      Brief hospital course: Fernando Hess is a 76 yo M with DM, HTN, CAD s/p CABG 2017, HLD, CKD IIIb baseline 1.8-2.2, dCHF, and pAF on Eliquis who presented with recurrent falls.  Evidently, the patient has had these symptoms for many months.  Was evaluated by Pulmonology and Cardiology, referred to Neurology, who made a diagnosis of Myasthenia Gravis.    Family however, feel that he is worsening, so brought him to the ER.  In the ER, Neurology questioned the diagnosis of MG, have broadened the work up.     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     Physical Exam: BP 134/86 (BP Location: Left Arm)   Pulse 96   Temp 98.3 F (36.8 C) (Oral)   Resp 20   Ht '5\' 6"'$  (1.676 m)   Wt 79.4 kg   SpO2 94%   BMI 28.25 kg/m   Overweight adult male, lying in bed, no acute distress RRR no murmurs, no LE edema Respiratory rate normal, lungs clear  without rales or wheezing Abdomen soft without grimace   Data Reviewed: CBC and BMP noted  Family Communication: Wife by phone    Disposition: Status is: Observation         Author: Edwin Dada, MD 08/13/2022 6:14 PM  For on call review [email or web address].

## 2022-08-13 NOTE — Progress Notes (Signed)
RT to pt's bedside for NIF/VC per MD request to complete once. Pt refusing and became very agitated and adament he will not do it. RT will make MD aware.

## 2022-08-13 NOTE — ED Notes (Signed)
Eating breakfast 

## 2022-08-13 NOTE — Evaluation (Signed)
Physical Therapy Evaluation Patient Details Name: Fernando Hess MRN: [id number] DOB: 21-Nov-1945 Today's Date: 08/13/2022  History of Present Illness  Pt is a 76 y/o male who presented after numerous falls in 2 days. Imaging negative for acute fx or infarct. Admitted for further work up and r/o of myasthenia gravis exacerbation. PMH: DM. HTN, HLD, sleep apnea, PAD, CAD s/p quintuple bypass, CKD, CHF, myasthenia gravis  Clinical Impression  Pt admitted with above diagnosis. At baseline, pt resides at home and is ambulatory with rollator.  He does have a history of falls but reports not as much as the past 2 days.  Today, pt presenting with mild strength deficits and required min guard for safety with ambulation and transfers. He was on RA with VSS.  Noted in neurology note that pt with drastic improvement since admission.   Pt currently with functional limitations due to the deficits listed below (see PT Problem List). Pt will benefit from skilled PT to increase their independence and safety with mobility to allow discharge to the venue listed below.          Recommendations for follow up therapy are one component of a multi-disciplinary discharge planning process, led by the attending physician.  Recommendations may be updated based on patient status, additional functional criteria and insurance authorization.  Follow Up Recommendations Home health PT      Assistance Recommended at Discharge Frequent or constant Supervision/Assistance  Patient can return home with the following  A little help with walking and/or transfers;A little help with bathing/dressing/bathroom;Assistance with cooking/housework;Help with stairs or ramp for entrance    Equipment Recommendations None recommended by PT  Recommendations for Other Services       Functional Status Assessment Patient has had a recent decline in their functional status and demonstrates the ability to make significant improvements in function  in a reasonable and predictable amount of time.     Precautions / Restrictions Precautions Precautions: Fall Restrictions Weight Bearing Restrictions: No      Mobility  Bed Mobility Overal bed mobility: Independent                  Transfers Overall transfer level: Needs assistance Equipment used: Rolling walker (2 wheels) Transfers: Sit to/from Stand Sit to Stand: Min guard           General transfer comment: Performed x 2 with min guard for safety    Ambulation/Gait Ambulation/Gait assistance: Min guard Gait Distance (Feet): 70 Feet (70'x2) Assistive device: Rolling walker (2 wheels) Gait Pattern/deviations: Step-through pattern, Decreased stride length Gait velocity: decreased     General Gait Details: min cues for RW proximity; no LOB; reports shakiness has improved  Stairs            Wheelchair Mobility    Modified Rankin (Stroke Patients Only)       Balance Overall balance assessment: Needs assistance, History of Falls Sitting-balance support: No upper extremity supported, Feet supported Sitting balance-Leahy Scale: Good     Standing balance support: Bilateral upper extremity supported, During functional activity, No upper extremity supported Standing balance-Leahy Scale: Fair Standing balance comment: RW to ambulate but able to static stand wihtout AD; hx of falls                             Pertinent Vitals/Pain Pain Assessment Pain Assessment: No/denies pain    Home Living Family/patient expects to be discharged to:: Private residence Living Arrangements: Spouse/significant  other Available Help at Discharge: Family;Available 24 hours/day Type of Home: House Home Access: Stairs to enter Entrance Stairs-Rails: Psychiatric nurse of Steps: 5   Home Layout: Two level;Able to live on main level with bedroom/bathroom Home Equipment: Rollator (4 wheels);Shower seat;Grab bars - tub/shower;BSC/3in1       Prior Function Prior Level of Function : Needs assist             Mobility Comments: P using Rollator for mobility but reports "sometimes it gets away from me". Pt reports 6 falls in 2 days though he feels is associated with new medication.  Does report frequent falls at baseline. ADLs Comments: Typically Independent with ADLs and IADLs though reports some recent difficulty. He reports spouse and he do not drive; daughter assists with transportation needs     Hand Dominance   Dominant Hand: Right    Extremity/Trunk Assessment   Upper Extremity Assessment Upper Extremity Assessment: Defer to OT evaluation    Lower Extremity Assessment Lower Extremity Assessment: LLE deficits/detail;RLE deficits/detail RLE Deficits / Details: ROM WFL; MMT 4+/5 LLE Deficits / Details: ROM WFL; MMT 4+/5    Cervical / Trunk Assessment Cervical / Trunk Assessment: Normal  Communication   Communication: No difficulties  Cognition Arousal/Alertness: Awake/alert Behavior During Therapy: WFL for tasks assessed/performed Overall Cognitive Status: No family/caregiver present to determine baseline cognitive functioning                                 General Comments: Appears more clear than initial presentation per chart. Pt with minor difficulties with problem solving and overall awareness though functional. Some confusion with history (difficulty reporting frequency of falls and repeative at times).        General Comments General comments (skin integrity, edema, etc.): VSS    Exercises     Assessment/Plan    PT Assessment Patient needs continued PT services  PT Problem List Decreased strength;Decreased coordination;Decreased cognition;Decreased activity tolerance;Decreased knowledge of use of DME;Decreased balance;Decreased safety awareness;Decreased mobility;Decreased knowledge of precautions       PT Treatment Interventions DME instruction;Therapeutic exercise;Gait  training;Balance training;Stair training;Functional mobility training;Therapeutic activities;Patient/family education;Cognitive remediation;Neuromuscular re-education    PT Goals (Current goals can be found in the Care Plan section)  Acute Rehab PT Goals Patient Stated Goal: return home when he is better PT Goal Formulation: With patient Time For Goal Achievement: 08/27/22 Potential to Achieve Goals: Good    Frequency Min 3X/week     Co-evaluation               AM-PAC PT "6 Clicks" Mobility  Outcome Measure Help needed turning from your back to your side while in a flat bed without using bedrails?: A Little Help needed moving from lying on your back to sitting on the side of a flat bed without using bedrails?: A Little Help needed moving to and from a bed to a chair (including a wheelchair)?: A Little Help needed standing up from a chair using your arms (e.g., wheelchair or bedside chair)?: A Little Help needed to walk in hospital room?: A Little Help needed climbing 3-5 steps with a railing? : A Little 6 Click Score: 18    End of Session Equipment Utilized During Treatment: Gait belt Activity Tolerance: Patient tolerated treatment well Patient left: in bed;with call bell/phone within reach;with bed alarm set Nurse Communication: Mobility status PT Visit Diagnosis: Other abnormalities of gait and mobility (R26.89)  Time: 1222-4114 PT Time Calculation (min) (ACUTE ONLY): 19 min   Charges:   PT Evaluation $PT Eval Low Complexity: 1 Low          Mally Gavina, PT Acute Rehab [facility] [phone]   Karlton Lemon 08/13/2022, 4:53 PM

## 2022-08-13 NOTE — Care Management Obs Status (Signed)
[place] NOTIFICATION   Patient Details  Name: FAHAD CISSE MRN: [id number] Date of Birth: 04/06/1946   Medicare Observation Status Notification Given:  Yes    Cyndi Bender, RN 08/13/2022, 3:21 PM

## 2022-08-13 NOTE — Evaluation (Signed)
Occupational Therapy Evaluation Patient Details Name: Fernando Hess MRN: [id number] DOB: 12-01-1945 Today's Date: 08/13/2022   History of Present Illness Pt is a 76 y/o male who presented after numerous falls in 2 days. Imaging negative for acute fx or infarct. Admitted for further work up and r/o of myasthenia gravis exacerbation. PMH: DM. HTN, HLD, sleep apnea, PAD, CAD s/p quintuple bypass, CKD, CHF, myasthenia gravis   Clinical Impression   PTA, pt lives with spouse, typically Modified Independent with ADLs and mobility using Rollator though endorses frequent falls. Pt reports feeling much better than on initial presentation to ED with continued minor deficits in cognition, standing balance, endurance and strength. Overall, pt requires min guard for mobility with RW, Setup for UB ADL and Min A for LB ADLs. Anticipate good progress during admission with HHOT follow up at DC.       Recommendations for follow up therapy are one component of a multi-disciplinary discharge planning process, led by the attending physician.  Recommendations may be updated based on patient status, additional functional criteria and insurance authorization.   Follow Up Recommendations  Home health OT     Assistance Recommended at Discharge Intermittent Supervision/Assistance  Patient can return home with the following A little help with bathing/dressing/bathroom;Assistance with cooking/housework;Direct supervision/assist for medications management;Direct supervision/assist for financial management;Assist for transportation    Functional Status Assessment  Patient has had a recent decline in their functional status and demonstrates the ability to make significant improvements in function in a reasonable and predictable amount of time.  Equipment Recommendations  Other (comment) (RW)    Recommendations for Other Services       Precautions / Restrictions Precautions Precautions: Fall Restrictions Weight  Bearing Restrictions: No      Mobility Bed Mobility Overal bed mobility: Independent                  Transfers Overall transfer level: Needs assistance Equipment used: Rolling walker (2 wheels) Transfers: Sit to/from Stand Sit to Stand: Min assist           General transfer comment: pt requesting OT to stabilize RW for standing to avoid tippiig DME over      Balance Overall balance assessment: Needs assistance, History of Falls Sitting-balance support: No upper extremity supported, Feet supported Sitting balance-Leahy Scale: Good     Standing balance support: Bilateral upper extremity supported, During functional activity Standing balance-Leahy Scale: Poor                             ADL either performed or assessed with clinical judgement   ADL Overall ADL's : Needs assistance/impaired Eating/Feeding: Independent   Grooming: Min guard;Standing   Upper Body Bathing: Set up;Sitting   Lower Body Bathing: Minimal assistance;Sit to/from stand   Upper Body Dressing : Set up;Sitting   Lower Body Dressing: Minimal assistance   Toilet Transfer: Min guard;Ambulation;Rolling walker (2 wheels);Regular Toilet;Grab bars Toilet Transfer Details (indicate cue type and reason): no overt LOB but noted narrow BOS and small steps, cues for RW use needed. able to walk into hallway to/from bathroom Toileting- Clothing Manipulation and Hygiene: Supervision/safety;Sitting/lateral lean;Sit to/from stand       Functional mobility during ADLs: Min guard;Rolling walker (2 wheels);Cueing for sequencing;Cueing for safety General ADL Comments: pt reports a lot of improvement in functional abilities since initial presentation though still at risk for falls     Vision Ability to See in Adequate Light: 1  Impaired Patient Visual Report: No change from baseline;Other (comment) (diabetic retinopathy at baseline) Vision Assessment?: No apparent visual deficits      Perception     Praxis      Pertinent Vitals/Pain Pain Assessment Pain Assessment: No/denies pain     Hand Dominance Right   Extremity/Trunk Assessment Upper Extremity Assessment Upper Extremity Assessment: Generalized weakness   Lower Extremity Assessment Lower Extremity Assessment: Defer to PT evaluation   Cervical / Trunk Assessment Cervical / Trunk Assessment: Normal   Communication Communication Communication: No difficulties   Cognition Arousal/Alertness: Awake/alert Behavior During Therapy: WFL for tasks assessed/performed Overall Cognitive Status: No family/caregiver present to determine baseline cognitive functioning                                 General Comments: appears more clear than initial presentation per chart, [place] difficulties with problem solving and overall awareness though functional. some confusion noted with PLOF questions, reporting "i dont drive anymore" when asked if pt able to do bathing/dressing at home     General Comments       Exercises     Shoulder Instructions      Home Living Family/patient expects to be discharged to:: Private residence Living Arrangements: Spouse/significant other Available Help at Discharge: Family;Available 24 hours/day Type of Home: House Home Access: Stairs to enter [employer] of Steps: 5 Entrance Stairs-Rails: Right;Left Home Layout: Two level;Able to live on main level with bedroom/bathroom     Bathroom Shower/Tub: Tub/shower unit   Bathroom Toilet: Handicapped height ([place] over toilet)     Home Equipment: Rollator (4 wheels);Shower seat;Grab bars - tub/shower;BSC/3in1          Prior Functioning/Environment Prior Level of Function : Needs assist             Mobility Comments: using Rollator for mobility but reports "sometimes it gets away from me". Pt reports 6 falls in 2 days though feels it is associated with new gabapentin prescription ADLs Comments:  typically Independent with ADLs and IADLs though reports some recent difficulty. reports spouse and he do not drive; daughter assists with transportation needs        OT Problem List: Decreased strength;Decreased activity tolerance;Impaired balance (sitting and/or standing);Decreased cognition;Decreased safety awareness;Decreased knowledge of use of DME or AE      OT Treatment/Interventions: Self-care/ADL training;Therapeutic exercise;Energy conservation;DME and/or AE instruction;Therapeutic activities    OT Goals(Current goals can be found in the care plan section) Acute Rehab OT Goals Patient Stated Goal: find root cause of issue, improve strength and balance OT Goal Formulation: With patient Time For Goal Achievement: 08/27/22 Potential to Achieve Goals: Good ADL Goals Pt Will Perform Lower Body Bathing: with modified independence;sit to/from stand Pt Will Perform Lower Body Dressing: with modified independence;sitting/lateral leans;sit to/from stand Pt Will Transfer to Toilet: with modified independence;ambulating Pt/caregiver will Perform Home Exercise Program: Increased strength;Both right and left upper extremity;With theraband;Independently;With written HEP provided Additional ADL Goal #1: Pt to demo ability to gather ADL/IADL items MOD I without LOB or safety concerns  OT Frequency: Min 2X/week    Co-evaluation              AM-PAC OT "6 Clicks" Daily Activity     Outcome Measure Help from another person eating meals?: None Help from another person taking care of personal grooming?: A Little Help from another person toileting, which includes using toliet, bedpan, or urinal?: A Little  Help from another person bathing (including washing, rinsing, drying)?: A Little Help from another person to put on and taking off regular upper body clothing?: A Little Help from another person to put on and taking off regular lower body clothing?: A Little 6 Click Score: 19   End of  Session Equipment Utilized During Treatment: Gait belt;Rolling walker (2 wheels) Nurse Communication: Mobility status  Activity Tolerance: Patient tolerated treatment well Patient left: in bed;with call bell/phone within reach;Other (comment) (speech at bedside)  OT Visit Diagnosis: Unsteadiness on feet (R26.81);Other abnormalities of gait and mobility (R26.89)                Time: 3094-0768 OT Time Calculation (min): 23 min Charges:  OT General Charges $OT Visit: 1 Visit OT Evaluation $OT Eval Moderate Complexity: 1 Mod  Malachy Chamber, OTR/L Acute Rehab Services Office: [phone]   Layla Maw 08/13/2022, 10:09 AM

## 2022-08-13 NOTE — Assessment & Plan Note (Signed)
-   Follow EEG - Follow zinc, copper, MMA, folate, vit E - SLP eval - Outpatient neuropsych eval - Outpatient Neuro

## 2022-08-13 NOTE — Assessment & Plan Note (Signed)
Continue atorvastatin

## 2022-08-13 NOTE — Evaluation (Signed)
Clinical/Bedside Swallow Evaluation Patient Details  Name: Fernando Hess MRN: [id number] Date of Birth: 1946/01/02  Today's Date: 08/13/2022 Time: SLP Start Time (ACUTE ONLY): 9983 SLP Stop Time (ACUTE ONLY): 3825 SLP Time Calculation (min) (ACUTE ONLY): 10 min  Past Medical History:  Past Medical History:  Diagnosis Date   Acute renal failure (ARF) ([place]) 09/24/2015   Acute stress disorder 11/13/2021   Atrial premature complexes    CAD (coronary artery disease) of artery bypass graft    Early occlusion of saphenous vein graft to intermediate and marginal branch in February 2007 following bypass grafting    CAD (coronary artery disease), native coronary artery 2017   hx NSTEMI 09-24-2015  s/p  CABG x5 on 10-02-2015;  post op STEMI inferolateral wall,  SVG OM1 and SVG OM2 occluded, distal OM occlusion the calpruit, treated medically // Myoview 7/21: no ischemia, EF 65, low risk   CHF (congestive heart failure) ([name])    CKD (chronic kidney disease), stage III ([name])    Contusion of right knee 03/16/2020   Dyspnea    Elevated troponin    Erectile dysfunction    Esophageal reflux    History of atrial fibrillation    post op CABG 10-02-2015   History of kidney stones    History of non-ST elevation myocardial infarction (NSTEMI) 09/24/2015   s/p  CABG x5   History of ST elevation myocardial infarction (STEMI) 10/22/2015   inferior wall,  post op CABG 10-02-2015   Hyperlipidemia    Hypertension    Left ureteral stone    Mild atherosclerosis of both carotid arteries    Nephrolithiasis    per CT bilateral non-obstructive calculi   OSA (obstructive sleep apnea)    Peripheral artery disease    LE Arterial US 01/2019: R PTA and ATA occluded; L ATA occluded   RBBB (right bundle branch block)    Renal atrophy, right    Sleep apnea    wears cpap    ST elevation myocardial infarction (STEMI) of inferior wall ([place]) 10/22/2015   Type 2 diabetes mellitus treated with insulin ([place])     followed by pcp   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 ([place]) 03/01/2008   Wears glasses    Past Surgical History:  Past Surgical History:  Procedure Laterality Date   [place] N/A 09/26/2015   Procedure: Left Heart Cath and Coronary Angiography;  Surgeon: Troy Sine, MD;  Location: [place] [facility];  Service: Cardiovascular;  Laterality: N/A;   CARDIAC CATHETERIZATION N/A 10/22/2015   Procedure: Left Heart Cath and Coronary Angiography;  Surgeon: Sherren Mocha, MD;  Location: [place] [facility];  Service: Cardiovascular;  Laterality: N/A;   CATARACT EXTRACTION W/ INTRAOCULAR LENS  IMPLANT, BILATERAL  2017   COLONOSCOPY     CORONARY ARTERY BYPASS GRAFT N/A 10/02/2015   Procedure: CORONARY ARTERY BYPASS GRAFTING (CABG) X5 LIMA-LAD; SVG-DIAG; SVG-OM; SVG-PD; SVG-RAMUS TRANSESOPHAGEAL ECHOCARDIOGRAM (TEE) ENDOSCOPIC GREATER SAPHENOUS VEIN  HARVEST BILAT LE;  Surgeon: Ivin Poot, MD;  Location: [place];  Service: Open Heart Surgery;  Laterality: N/A;   CYSTOSCOPY/URETEROSCOPY/HOLMIUM LASER/STENT PLACEMENT Left 08/10/2018   Procedure: CYSTOSCOPY/URETEROSCOPY/HOLMIUM LASER/STENT PLACEMENT;  Surgeon: Festus Aloe, MD;  Location: [facility];  Service: Urology;  Laterality: Left;   CYSTOSCOPY/URETEROSCOPY/HOLMIUM LASER/STENT PLACEMENT Left 09/10/2018   Procedure: CYSTOSCOPY/URETEROSCOPY/HOLMIUM LASER/STENT EXCHANGE;  Surgeon: Festus Aloe, MD;  Location: [facility];  Service: Urology;  Laterality: Left;  LEFT HEART CATHETERIZATION WITH CORONARY ANGIOGRAM N/A 04/13/2014   Procedure: LEFT HEART CATHETERIZATION WITH CORONARY ANGIOGRAM;  Surgeon: Jacolyn Reedy, MD;  Location: [facility];  Service: Cardiovascular;  Laterality: N/A;   LEG SURGERY Right age 57   closed reduction leg fracture   NASAL SEPTOPLASTY W/ TURBINOPLASTY Bilateral 08/30/2021   Procedure: NASAL  SEPTOPLASTY WITH BILATERAL INFERIOR TURBINATE REDUCTION;  Surgeon: Jerrell Belfast, MD;  Location: [place];  Service: ENT;  Laterality: Bilateral;   POLYPECTOMY     RIGHT HEART CATH N/A 06/06/2021   Procedure: RIGHT HEART CATH;  Surgeon: Jolaine Artist, MD;  Location: [place] [facility];  Service: Cardiovascular;  Laterality: N/A;   TEE WITHOUT CARDIOVERSION N/A 10/02/2015   Procedure: TRANSESOPHAGEAL ECHOCARDIOGRAM (TEE);  Surgeon: Ivin Poot, MD;  Location: [place];  Service: Open Heart Surgery;  Laterality: N/A;   URETEROSCOPY WITH HOLMIUM LASER LITHOTRIPSY Bilateral 2004;  2005  dr grapey  '@[facility]'$    VASECTOMY     HPI:  Pt is a 76 y/o male who presented after numerous falls in 2 days. Imaging negative for acute fx or infarct. Admitted for further work up and r/o of myasthenia gravis exacerbation. PMH: DM. HTN, HLD, sleep apnea, PAD, CAD s/p quintuple bypass, CKD, CHF, myasthenia gravis    Assessment / Plan / Recommendation  Clinical Impression  Pt participated in clinical swallow evaluation. He was alert and seated at edge of bed upon entry. Pt reports he "sometimes gets choked while eating" but he has not experienced an increase in this symptom recently. Oral motor exam was unremarkable. Pt observed with trials of thin liquids, puree and regular consistency. One instance of immediate cough after thin liquids observed, however, suspect due to pt attempting to verbalize while swallowing. No overt s/sx aspiration observed during trials of multiple consecutive sips x2, puree or regular consistency. Mastication was efficient and functional. Recommend pt continue regular consistency/thin liquid diet, medications whole with liquids. No SLP treatment required. SLP Visit Diagnosis: Dysphagia, unspecified (R13.10)    Aspiration Risk  No limitations    Diet Recommendation Regular;Thin liquid   Liquid Administration via: Straw;Cup Medication Administration: Whole meds with liquid Supervision:  Patient able to self feed Compensations: Slow rate;Small sips/bites Postural Changes: Seated upright at 90 degrees    Other  Recommendations Oral Care Recommendations: Oral care BID    Recommendations for follow up therapy are one component of a multi-disciplinary discharge planning process, led by the attending physician.  Recommendations may be updated based on patient status, additional functional criteria and insurance authorization.  Follow up Recommendations No SLP follow up      Assistance Recommended at Discharge    Functional Status Assessment Patient has not had a recent decline in their functional status  Frequency and Duration            Prognosis        Swallow Study   General Date of Onset: 08/12/22 HPI: Pt is a 76 y/o male who presented after numerous falls in 2 days. Imaging negative for acute fx or infarct. Admitted for further work up and r/o of myasthenia gravis exacerbation. PMH: DM. HTN, HLD, sleep apnea, PAD, CAD s/p quintuple bypass, CKD, CHF, myasthenia gravis Type of Study: Bedside Swallow Evaluation Previous Swallow Assessment:  (none) Diet Prior to this Study: Regular;Thin liquids Temperature Spikes Noted: No Respiratory Status: Room air History of Recent Intubation: No Behavior/Cognition: Alert;Cooperative;Pleasant mood Oral Cavity Assessment: Within Functional Limits Oral Care Completed by SLP: No Oral  Cavity - Dentition: Adequate natural dentition Vision: Functional for self-feeding Self-Feeding Abilities: Able to feed self Patient Positioning: Other (comment) (Seated on edge of bed) Baseline Vocal Quality: Normal Volitional Cough: Strong Volitional Swallow: Able to elicit    Oral/Motor/Sensory Function Overall Oral Motor/Sensory Function: Within functional limits   Ice Chips Ice chips: Not tested   Thin Liquid Thin Liquid: Impaired Presentation: Straw;Self Fed Pharyngeal  Phase Impairments: Cough - Immediate    Nectar Thick Nectar Thick  Liquid: Not tested   Honey Thick Honey Thick Liquid: Not tested   Puree Puree: Within functional limits Presentation: Self Fed;Spoon   Solid     Solid: Within functional limits      [facility] [place] Student SLP  08/13/2022,2:01 PM

## 2022-08-13 NOTE — Assessment & Plan Note (Signed)
Continue PPI ?

## 2022-08-13 NOTE — Progress Notes (Signed)
EEG complete - results pending 

## 2022-08-13 NOTE — Progress Notes (Signed)
Patient is not coordinated enough to give a good effort with NIF or VC. Got frustrated did not not want to try again. No values obtained.

## 2022-08-13 NOTE — TOC CAGE-AID Note (Signed)
Transition of Care [facility]) - CAGE-AID Screening   Patient Details  Name: Fernando Hess MRN: [id number] Date of Birth: [birth year]  Transition of Care [facility]) CM/SW Contact:    Army Melia, RN Phone Number:[phone] 08/13/2022, 10:43 PM   Clinical Narrative:  No hx of drug/alcohol use, no resources indicated.  CAGE-AID Screening:    Have You Ever Felt You Ought to Cut Down on Your Drinking or Drug Use?: No Have People Annoyed You By Critizing Your Drinking Or Drug Use?: No Have You Felt Bad Or Guilty About Your Drinking Or Drug Use?: No Have You Ever Had a Drink or Used Drugs First Thing In The Morning to Steady Your Nerves or to Get Rid of a Hangover?: No CAGE-AID Score: 0  Substance Abuse Education Offered: No

## 2022-08-13 NOTE — Assessment & Plan Note (Signed)
BP normal - Hold home furosemide

## 2022-08-13 NOTE — Assessment & Plan Note (Signed)
Continue duloxetine

## 2022-08-13 NOTE — Hospital Course (Signed)
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  Evidently, the patient has had these symptoms for many months.  Was evaluated by Pulmonology and Cardiology, referred to Neurology, who made a diagnosis of Myasthenia Gravis.    Family however, feel that he is worsening, so brought him to the ER.  In the ER, Neurology questioned the diagnosis of MG, have broadened the work up.

## 2022-08-13 NOTE — Progress Notes (Addendum)
Neurology Progress Note  Patient ID: Fernando Hess is a 76 y.o. with PMHx of  has a past medical history of Acute renal failure (ARF) ([place]) (09/24/2015), Acute stress disorder (11/13/2021), Atrial premature complexes, CAD (coronary artery disease) of artery bypass graft, CAD (coronary artery disease), native coronary artery (2017), CHF (congestive heart failure) ([place]), CKD (chronic kidney disease), stage III ([place]), Contusion of right knee (03/16/2020), Dyspnea, Elevated troponin, Erectile dysfunction, Esophageal reflux, History of atrial fibrillation, History of kidney stones, History of non-ST elevation myocardial infarction (NSTEMI) (09/24/2015), History of ST elevation myocardial infarction (STEMI) (10/22/2015), Hyperlipidemia, Hypertension, Left ureteral stone, Mild atherosclerosis of both carotid arteries, Nephrolithiasis, OSA (obstructive sleep apnea), Peripheral artery disease, RBBB (right bundle branch block), Renal atrophy, right, Sleep apnea, ST elevation myocardial infarction (STEMI) of inferior wall ([place]) (10/22/2015), Type 2 diabetes mellitus treated with insulin ([place]), 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  Initially consulted for: possible seronegative myasthenia gravis  Subjective: - Marked improvement in symptoms but still somewhat ataxic  Exam: Vitals:   08/13/22 0900 08/13/22 0930  BP: (!) 168/104 (!) 128/112  Pulse: (!) 103 (!) 104  Resp:    Temp:    SpO2: 93% 95%   Gen: In bed, comfortable  Resp: non-labored breathing, no grossly audible wheezing Cardiac: Perfusing extremities well  Abd: soft, nt  Neuro: MS: Awake, alert, oriented by only remembers some details of our meeting last night (does not recall that I met his daughter, does not recall that I tested his neck flexion for example), following commands, speech pattern remains slightly irregular but daughter  reports that is his baseline/accent CN: Tracks examiner in all directions, face symmetric, tongue midline Motor: Moves bilateral upper extremities equally on finger-nose testing.  Stands and is able to stand briefly on heels and toes Sensory: Romberg positive.  Impaired proprioception of the fingertips, mild DTR: Remains hyperreflexic throughout, stable, a little bit more noticeable on the left than the right still  Pertinent Labs:  Basic Metabolic Panel: Recent Labs  Lab 08/12/22 1117  NA 137  K 4.2  CL 104  CO2 23  GLUCOSE 174*  BUN 25*  CREATININE 1.84*  CALCIUM 8.9    CBC: Recent Labs  Lab 08/12/22 1117  WBC 8.7  NEUTROABS 6.6  HGB 13.6  HCT 42.8  MCV 92.2  PLT 222    Coagulation Studies: No results for input(s): "LABPROT", "INR" in the last 72 hours.   Lab Results  Component Value Date   HGBA1C 11.0 (H) 08/27/2021      Lab Results  Component Value Date   [id number] 270 08/13/2022    MRI brain and C-spine personally reviewed, agree with radiology:   1. No acute intracranial abnormality. 2. Findings of chronic small vessel ischemia and volume loss.  1. No acute abnormality of the cervical spine. 2. Mild spinal canal stenosis and left neural foraminal stenosis at C3-4.  Impression: His dramatic improvement is in the setting of normalization of glucose and I suspect this may be explaining the majority of his weakness especially given longstanding elevated A1c (today's value still pending).  Given he still does have some confusion we will obtain EEG as well to complete reversible causes workup and exclude anything immediately treatable.  I did order some additional labs to evaluate for reversible causes of myelopathy and altered mental status, which can be followed up on an outpatient basis.  At this time I  do not have any concern for acute myasthenia flare and I am not certain he meets criteria for diagnosis of myasthenia but defer further evaluation of this to  an outpatient basis.  From a neurological perspective if EEG is negative and SLP evaluation is reassuring he is cleared for discharge  Recommendations: Myelopathy labs: zinc, copper, MMA, folate, Vitamin E pending  B12 736 Reversible causes of dementia labs, A1c pending, HIV pending,   Negative: TSH, Ammonia, RPR Routine EEG  SLP eval pending Recommend outpatient neurocognitive evaluation Outpatient referral to Dr. Narda Amber for second opinion on possible myasthenia gravis placed  Neurology will follow-up EEG but if this is reassuring will be available as needed, please do not hesitate to reach out if additional questions or concerns arise  Lesleigh Noe MD-PhD [facility] [phone]  Available 7 AM to 7 PM, outside these hours please contact Neurologist on call listed on AMION

## 2022-08-13 NOTE — Procedures (Signed)
Patient Name: KINSEY COWSERT  MRN: [id number]  Epilepsy Attending: Lora Havens  Referring Physician/Provider: Lorenza Chick, MD  Date: 08/13/2022 Duration: 21.28 mins  Patient history: 76 year old male with altered mental status.  EEG to evaluate for seizure.  Level of alertness: Awake  AEDs during EEG study: None  Technical aspects: This EEG study was done with scalp electrodes positioned according to the 10-20 International system of electrode placement. Electrical activity was reviewed with band pass filter of 1-'70Hz'$ , sensitivity of 7 uV/mm, display speed of 8m/sec with a '60Hz'$  notched filter applied as appropriate. EEG data were recorded continuously and digitally stored.  Video monitoring was available and reviewed as appropriate.  Description: The posterior dominant rhythm consists of 8 Hz activity of moderate voltage (25-35 uV) seen predominantly in posterior head regions, symmetric and reactive to eye opening and eye closing. Hyperventilation and photic stimulation were not performed.     IMPRESSION: This study is within normal limits. No seizures or epileptiform discharges were seen throughout the recording.  Yassine Brunsman OBarbra Sarks

## 2022-08-13 NOTE — Assessment & Plan Note (Signed)
Stable relative to baseline

## 2022-08-13 NOTE — Progress Notes (Signed)
Patient still refusing to do NIF/VC.

## 2022-08-13 NOTE — Assessment & Plan Note (Signed)
Glucose elevated - Continue home glargine and aspart

## 2022-08-13 NOTE — Assessment & Plan Note (Signed)
-   CPAP at night °

## 2022-08-13 NOTE — Assessment & Plan Note (Signed)
-   Continue Eliquis 

## 2022-08-13 NOTE — TOC Initial Note (Addendum)
Transition of Care [facility]) - Initial/Assessment Note    Patient Details  Name: Fernando Hess MRN: [id number] Date of Birth: 07-13-1946  Transition of Care [facility]) CM/SW Contact:    Cyndi Bender, RN Phone Number: 08/13/2022, 4:17 PM  Clinical Narrative:                 Spoke to patient regarding transition needs. Patient states he lives with his wife who he takes care of. Patient has a daughter that helps with transportation.  Patient has a walker, wheelchair, and crutches. Patient requesting rollator. [place] with [facility] notified of DME needs. Patient is active with [facility] for RN, PT, OT, SLP. Home health re-ordered. [place] with [facility] accepted referral.   TOC will continue to follow for needs.   Address, Phone number and PCP verified.  Expected Discharge Plan: [place] Barriers to Discharge: Continued Medical Work up   Patient Goals and CMS Choice Patient states their goals for this hospitalization and ongoing recovery are:: return home CMS Medicare.gov Compare Post Acute Care list provided to:: Patient Choice offered to / list presented to : Patient  Expected Discharge Plan and Services Expected Discharge Plan: [place]   Discharge Planning Services: CM Consult Post Acute Care Choice: Home Health, Durable Medical Equipment Living arrangements for the past 2 months: Single Family Home                 DME Arranged: Walker rolling with seat DME Agency: [facility] Date DME Agency Contacted: 08/13/22 Time DME Agency Contacted: [phone] Representative spoke with at DME Agency: Annie Sable HH Arranged: RN, PT, OT, Speech Therapy [place] Agency: [place] ([place]) Date [place]: 08/13/22 Time [place]: 1612 Representative spoke with at [place]: Annie Sable  Prior Living Arrangements/Services Living arrangements for the past 2 months: [place] Lives with:: Spouse Patient language and need for  interpreter reviewed:: Yes Do you feel safe going back to the place where you live?: Yes      Need for Family Participation in Patient Care: Yes (Comment) Care giver support system in place?: Yes (comment) Current home services: DME (walker, wheelchair, crutches) Criminal Activity/Legal Involvement Pertinent to Current Situation/Hospitalization: No - Comment as needed  Activities of Daily Living Home Assistive Devices/Equipment: Gilford Rile (specify type) ADL Screening (condition at time of admission) Patient's cognitive ability adequate to safely complete daily activities?: Yes Is the patient deaf or have difficulty hearing?: No Does the patient have difficulty seeing, even when wearing glasses/contacts?: No Does the patient have difficulty concentrating, remembering, or making decisions?: No Patient able to express need for assistance with ADLs?: Yes Does the patient have difficulty dressing or bathing?: No Independently performs ADLs?: Yes (appropriate for developmental age) Does the patient have difficulty walking or climbing stairs?: No Weakness of Legs: Both Weakness of Arms/Hands: None  Permission Sought/Granted Permission sought to share information with : Investment banker, corporate granted to share info w AGENCY: HH        Emotional Assessment Appearance:: Appears stated age Attitude/Demeanor/Rapport: Engaged Affect (typically observed): Accepting Orientation: : Oriented to Self, Oriented to Place, Oriented to  Time, Oriented to Situation Alcohol / Substance Use: Not Applicable Psych Involvement: No (comment)  Admission diagnosis:  Weakness [R53.1] Generalized weakness [R53.1] Patient Active Problem List   Diagnosis Date Noted   Generalized weakness 08/12/2022   Myasthenia gravis ([place]) 04/21/2022   Chronic kidney disease, stage 3b ([place]) 03/21/2022  Posterior vitreous detachment of both eyes 01/23/2022   Constipation 11/13/2021   Type 2 diabetes  mellitus with moderate nonproliferative diabetic retinopathy with macular edema, bilateral ([place]) 11/13/2021   Severe major depression, single episode, without psychotic features ([place]) 11/13/2021   Amaurosis fugax 11/13/2021   Amnesia 11/13/2021   Anxiety 11/13/2021   Cardiac arrhythmia 11/13/2021   Hearing loss 11/13/2021   Hypercoagulable state ([place]) 11/13/2021   Hyperglycemia due to type 2 diabetes mellitus ([place]) 11/13/2021   Hyperparathyroidism due to renal insufficiency ([place]) 11/13/2021   Mild aortic stenosis 11/05/2021   Paroxysmal atrial fibrillation ([place]) 11/05/2021   Nasal septal deviation 08/30/2021   Anticoagulated by anticoagulation treatment 07/16/2021   Syncope 05/10/2021   Gastro-esophageal reflux disease without esophagitis 07/25/2020   Globus pharyngeus 07/25/2020   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 ([place]) 12/21/2019   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   Chronic heart failure with preserved ejection fraction (HFpEF) ([place]) 11/18/2019   OSA on CPAP 03/07/2019   Peripheral artery disease    Intractable vascular headache 11/11/2018   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   Coronary artery disease involving native coronary artery of native heart without angina pectoris 09/24/2015   Moderate nonproliferative diabetic retinopathy of left eye with macular edema associated with type 2 diabetes mellitus ([place]) 03/01/2008   Hyperlipidemia LDL goal <70    Essential hypertension    History of kidney stones    PCP:  [name], MD Pharmacy:   [facility] - [place], [place] - [address] [place] [address] Phone: [phone] Fax: [phone]     Social Determinants of Health (SDOH) Interventions    Readmission Risk Interventions     No data to display

## 2022-08-14 DIAGNOSIS — R531 Weakness: Secondary | ICD-10-CM | POA: Diagnosis not present

## 2022-08-14 LAB — HIV-1 RNA QUANT-NO REFLEX-BLD
HIV 1 RNA Quant: <20 copies/mL
LOG10 HIV-1 RNA: UNDETERMINED log10copy/mL

## 2022-08-14 LAB — HEMOGLOBIN A1C
Hgb A1c MFr Bld: 14.6 % — ABNORMAL HIGH (ref 4.8–5.6)
Mean Plasma Glucose: 372 mg/dL

## 2022-08-14 LAB — GLUCOSE, CAPILLARY
Glucose-Capillary: 229 mg/dL — ABNORMAL HIGH (ref 70–99)
Glucose-Capillary: 257 mg/dL — ABNORMAL HIGH (ref 70–99)

## 2022-08-14 NOTE — Discharge Summary (Signed)
Physician Discharge Summary   Patient: Fernando Hess MRN: [id number] DOB: [birth year]  Admit date:     08/12/2022  Discharge date: 08/14/22  Discharge Physician: Edwin Dada   PCP: [name], MD     Recommendations at discharge:  Follow up with PCP Dr. Harrington Challenger Dr. Harrington Challenger: Please follow up vitamin B1, vitamin E, MMA, zinc, copper, and HIV levels Please follow up patient's efforts to move in to ALF Please confirm follow up with Neurology Please refer for neuropsychiatric/neurocognitive testing to evaluate extent of memory loss  Follow up with Neurology, Dr. 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  Evidently, the patient has had these symptoms for many months.  Was evaluated by Pulmonology and Cardiology, referred to Neurology, who made a diagnosis of Myasthenia Gravis.    Family however, feel that he is worsening, so brought him to the ER.  In the ER, Neurology questioned the diagnosis of MG, have broadened the work up.      Weakness, forgetfulness possibly due to hyperglycemia Patient was evaluated and treated with fluids and insulin.  Neurology consulted.   Neuraxial imaging with CT head, MRI brain, cervical spine and MRI lumbar spine were all unremarkable.    EEG was normal.  B12, TSH and RPR were normal.  No evidence of infection, heart failure, liver failure, or active respiratory disease.  Speech evaluation of swallow was normal.    Neurology felt there was no clinical data to suspect a diagnosis of myasthenia gravis.    Certainly here, his vague memory complaints are consistent with early dementia/Mild cognitive impairment, but also depression.  To rule out a reversible cause of memory loss, vitamin B1, MMA, HIV were sent.  Neurology's work up for myelopathy was also negative, although labs for copper and zinc are pending.    The patient's symptoms improved with control of glucose (note HgbA1c >14%, consistent with failure of home control due to dementia or depression)  Patient able to work with PT who recommended [place].  Today, able to work with mobility specialist, and ambulated 50 feet with min guard only.    Recommend HH.  Given the work up for organic causes of memory loss and myelopathy are likely to be normal, would recommend PCP follow up for B1, MMA, zinc and copper and if normal, for the patient to transition into ALF.  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   Consultants: Neurology   Disposition: Home  health   DISCHARGE MEDICATION: Allergies as of 08/14/2022       Reactions   Cilostazol Swelling, Other (See Comments)   Edema   Dulaglutide Nausea And Vomiting, Other (See Comments)   TRULICITY   Levofloxacin Hives, Itching, Rash   Liraglutide Other (See Comments)   Severe fatigue & insomnia   Lisinopril Itching, Rash, Cough        Medication List     STOP taking these medications    gabapentin 300 MG capsule Commonly known as: NEURONTIN   tiZANidine 2 MG tablet Commonly known as: ZANAFLEX       TAKE these medications    acetaminophen 325 MG tablet Commonly known as: TYLENOL Take 325-650 mg by mouth every 6 (six) hours as needed for headache or mild pain.   apixaban 5 MG Tabs tablet Commonly known as: Eliquis TAKE ONE TABLET BY MOUTH EVERY MORNING and TAKE ONE TABLET BY MOUTH EVERY EVENING What changed:  how much to take how to take this when to take this additional instructions   atorvastatin 80 MG tablet Commonly known as: LIPITOR Take 1 tablet (80 mg total) by mouth daily. What changed: when to take this   B-D UF III MINI PEN NEEDLES 31G X 5 MM Misc Generic drug: Insulin Pen Needle USE AS DIRECTED WITH LANTUS SOLOSTAR   Basaglar KwikPen 100 UNIT/ML Inject 40 Units into the skin in the morning.   Benadryl Allergy 25 mg capsule Generic drug: diphenhydrAMINE Take 25 mg by mouth every 6 (six) hours as needed for allergies.   DULoxetine 30 MG capsule Commonly known as: CYMBALTA Take 30 mg by mouth every morning.   DULoxetine 60 MG capsule Commonly known as: CYMBALTA Take 60 mg by mouth every morning.   FreeStyle Libre 2 Sensor Misc Inject 1 Device into the skin every 14 (fourteen) days.   furosemide 20 MG tablet Commonly known as: LASIX Take 20 mg by mouth every Monday, Wednesday, and Friday.   guaiFENesin-codeine 100-10 MG/5ML syrup Commonly known as: ROBITUSSIN AC take 10m BY MOUTH EVERY 4 HOURS AS NEEDED What changed:  how much to  take how to take this when to take this reasons to take this additional instructions   insulin lispro 100 UNIT/ML KwikPen Commonly known as: HUMALOG Inject 8 Units into the skin 3 (three) times daily with meals.   MAGNESIUM PO Take 350 mg by mouth every evening.   nitroGLYCERIN 0.4 MG SL tablet Commonly known as: NITROSTAT Place 0.4 mg under the tongue every 5 (five) minutes as needed for chest pain.   nystatin cream Commonly known as: MYCOSTATIN Apply 1 Application topically See admin instructions. Apply to the groin in the morning and evening when not using the nystatin powder   nystatin powder Commonly known as: MYCOSTATIN/NYSTOP Apply 1 Application topically See admin instructions. Apply to the groin in the morning and evening   Octagam 20 GM/200ML Soln Generic drug: Immune Globulin 10% Inject into the vein.   ondansetron 4 MG disintegrating tablet Commonly known as: ZOFRAN-ODT Take  4 mg by mouth every 8 (eight) hours as needed for nausea or vomiting (dissolve orally).   pantoprazole 40 MG tablet Commonly known as: PROTONIX TAKE 1 TABLET BY MOUTH EVERY DAY What changed: when to take this   SENIOR MULTIVITAMIN PLUS PO Take 1 tablet by mouth daily with breakfast.               Durable Medical Equipment  (From admission, onward)           Start     Ordered   08/13/22 1616  For home use only DME 4 wheeled rolling walker with seat  Once       Question:  Patient needs a walker to treat with the following condition  Answer:  Weakness   08/13/22 1615            [place], [place] Follow up.   Why: Home health arranged. They will contact you to schedule apt. Contact information: [address] [place] [address] [phone]         [name], MD. Schedule an appointment as soon as possible for a visit in 1 week(s).   Specialty: Family Medicine Contact information: [place] [address] [place], [place], DO. Go in 2 month(s).   Specialty: Neurology Contact information: [place] [address] [phone]                 Discharge Instructions     Ambulatory referral to Neurology   Complete by: As directed    An appointment is requested in approximately: 4 - 8 weeks   Discharge instructions   Complete by: As directed    **IMPORTANT DISCHARGE INSTRUCTIONS**   From Dr. Loleta Books: You were admitted for weakness, falls, memory loss. Here, we found that your blood sugar was high.  We did not find that any of your symptoms of weakness, fatigue, shortness of breath or memory were related to a lung or heart disease.   You were evaluated by our Neurology specialist, who also didn't feel that any of your symptoms were explained by Myasthenia Gravis either.  Instead, what we observed here was that your symptoms improved with treatment of your blood sugar.  We also observed some confusion, which is expected in someone with mild memory loss when they are sleep deprived and in the hospital in an unfamiliar environment.  We also did send some labs for mineral and vitamin deficiencies that can sometimes cause a similar vague syndrome of weakness and memory loss (like zinc deficiency, and vitamin C deficiency) Dr. Harrington Challenger or Neurology should follow up these labs    We recommend you go see Dr. Harrington Challenger in 1 week Ask for a referral for neuropsychiatric cognitive testing for memory loss, if Dr. Harrington Challenger hasn't already obtained this  We recommend you follow up with Neurologist Dr. Narda Amber at [facility] in 6-8 eweks We have sent this referral, and they should call you If you haven't heard from them in 1 week, call them They can provide a second opinion on the myasthenia gravis and also help you   Given your memory issues and gradually progressive weakness, we have arranged a home health  agency to see you Work with their social worker (or with Dr. Alan Ripper office) to obtain more robust help at home or to explore assisted living  facilities  Lastly, avoid gabapentin and tizanidine, as these are associated with sedation, weaknes and memory loss and falls  Important: at the moment, you are weak and require ongoing support.  I do not think you are physically capable of being the sole caregiver for someone else at the moment, for chores around the house (like cooking or cleaning) or out of the house (like groceries, etc).  In fact, it is my opinion that more than likely, the work up initiated in the hospital will be reassuring, and that you would best be able to thrive if you moved into an assisted living setting.   Increase activity slowly   Complete by: As directed        Discharge Exam: Filed Weights   08/12/22 1117 08/12/22 1123 08/14/22 0500  Weight: 79.4 kg 79.4 kg 81.7 kg    General: Pt is alert, awake, not in acute distress.  Sitting up in bed, interactive. Cardiovascular: RRR, nl S1-S2, no murmurs appreciated.   No LE edema.   Respiratory: Normal respiratory rate and rhythm.  CTAB without rales or wheezes. Abdominal: Abdomen soft and non-tender.  No distension or HSM.   Neuro/Psych: Strength symmetric in upper and lower extremities, effort questioned.  Judgment and insight appear tired, distracted, some short term memory impairment.  Affect blunted.   Condition at discharge: stable  The results of significant diagnostics from this hospitalization (including imaging, microbiology, ancillary and laboratory) are listed below for reference.   Imaging Studies: EEG adult  Result Date: 09/12/2022 Lora Havens, MD     09-12-22  5:56 PM Patient Name: YVONNE PETITE MRN: [id number] Epilepsy Attending: Lora Havens Referring Physician/Provider: Lorenza Chick, MD Date: 2022-09-12 Duration: 21.28 mins Patient history: 76 year old male with altered mental status.   EEG to evaluate for seizure. Level of alertness: Awake AEDs during EEG study: None Technical aspects: This EEG study was done with scalp electrodes positioned according to the 10-20 International system of electrode placement. Electrical activity was reviewed with band pass filter of 1-'70Hz'$ , sensitivity of 7 uV/mm, display speed of 65m/sec with a '60Hz'$  notched filter applied as appropriate. EEG data were recorded continuously and digitally stored.  Video monitoring was available and reviewed as appropriate. Description: The posterior dominant rhythm consists of 8 Hz activity of moderate voltage (25-35 uV) seen predominantly in posterior head regions, symmetric and reactive to eye opening and eye closing. Hyperventilation and photic stimulation were not performed.   IMPRESSION: This study is within normal limits. No seizures or epileptiform discharges were seen throughout the recording. Priyanka OBarbra Sarks  MR LUMBAR SPINE WO CONTRAST  Result Date: 12023-12-29CLINICAL DATA:  Chronic myelopathy EXAM: MRI LUMBAR SPINE WITHOUT CONTRAST TECHNIQUE: Multiplanar, multisequence MR imaging of the lumbar spine was performed. No intravenous contrast was administered. COMPARISON:  None Available. FINDINGS: Segmentation:  Standard. Alignment:  Physiologic. Vertebrae: Mild wedge compression deformity of L5 with less than 25% height loss. Minimal edema, age indeterminate. Conus medullaris and cauda equina: Conus extends to the L1 level. Conus and cauda equina appear normal. Paraspinal and other soft tissues: Negative Disc levels: T12-L1: Small left subarticular disc protrusion without stenosis. L1-L2: Small left subarticular disc protrusion. No spinal canal stenosis. Moderate left neural foraminal stenosis. L2-L3: Normal disc space and facet joints. No spinal canal stenosis. No neural foraminal stenosis. L3-L4: Left asymmetric disc bulge. Left lateral recess narrowing without central spinal canal stenosis. Mild left neural  foraminal stenosis. L4-L5: Left asymmetric disc bulge with severe left facet  arthrosis. Mild spinal canal stenosis. No neural foraminal stenosis. L5-S1: Disc space narrowing with facet arthrosis and small disc bulge. No spinal canal stenosis. Mild bilateral neural foraminal stenosis. Visualized sacrum: Normal. IMPRESSION: 1. Mild wedge compression deformity of L5 with less than 25% height loss and minimal edema, age indeterminate. Correlate for history of trauma. 2. Mild spinal canal stenosis at L4-L5 due to combination of disc bulge and severe left facet arthrosis. 3. Moderate left L1-L2 neural foraminal stenosis. 4. Mild left L3-L4 and bilateral L5-S1 neural foraminal stenosis. Electronically Signed   By: Ulyses Jarred M.D.   On: 08/13/2022 00:05   MR CERVICAL SPINE WO CONTRAST  Result Date: 08/12/2022 CLINICAL DATA:  Myelopathy EXAM: MRI CERVICAL SPINE WITHOUT CONTRAST TECHNIQUE: Multiplanar, multisequence MR imaging of the cervical spine was performed. No intravenous contrast was administered. COMPARISON:  None Available. FINDINGS: Alignment: Physiologic. Vertebrae: No fracture, evidence of discitis, or bone lesion. Cord: Normal signal and morphology. Posterior Fossa, vertebral arteries, paraspinal tissues: Negative. Disc levels: C1-2: Unremarkable. C2-3: Mild facet hypertrophy. No disc herniation. There is no spinal canal stenosis. No neural foraminal stenosis. C3-4: Small disc bulge with left uncovertebral spurring. Mild spinal canal stenosis. Mild left neural foraminal stenosis. C4-5: Small disc bulge with uncovertebral spurring, unchanged. There is no spinal canal stenosis. No neural foraminal stenosis. C5-6: Minimal disc bulge, unchanged. There is no spinal canal stenosis. No neural foraminal stenosis. C6-7: Normal disc space and facet joints. There is no spinal canal stenosis. No neural foraminal stenosis. C7-T1: Normal disc space and facet joints. There is no spinal canal stenosis. No neural  foraminal stenosis. IMPRESSION: 1. No acute abnormality of the cervical spine. 2. Mild spinal canal stenosis and left neural foraminal stenosis at C3-4. Electronically Signed   By: Ulyses Jarred M.D.   On: 08/12/2022 23:51   MR BRAIN WO CONTRAST  Result Date: 08/12/2022 CLINICAL DATA:  Fall.  History of myasthenia gravis. EXAM: MRI HEAD WITHOUT CONTRAST TECHNIQUE: Multiplanar, multiecho pulse sequences of the brain and surrounding structures were obtained without intravenous contrast. COMPARISON:  09/01/2013 FINDINGS: Brain: No acute infarct, mass effect or extra-axial collection. No acute or chronic hemorrhage. There is multifocal hyperintense T2-weighted signal within the white matter. Generalized volume loss. The midline structures are normal. Vascular: Major flow voids are preserved. Skull and upper cervical spine: Normal calvarium and skull base. Visualized upper cervical spine and soft tissues are normal. Sinuses/Orbits:No paranasal sinus fluid levels or advanced mucosal thickening. No mastoid or middle ear effusion. Normal orbits. IMPRESSION: 1. No acute intracranial abnormality. 2. Findings of chronic small vessel ischemia and volume loss. Electronically Signed   By: Ulyses Jarred M.D.   On: 08/12/2022 23:44   CT Head Wo Contrast  Result Date: 08/12/2022 CLINICAL DATA:  Trauma EXAM: CT HEAD WITHOUT CONTRAST TECHNIQUE: Contiguous axial images were obtained from the base of the skull through the vertex without intravenous contrast. RADIATION DOSE REDUCTION: This exam was performed according to the departmental dose-optimization program which includes automated exposure control, adjustment of the mA and/or kV according to patient size and/or use of iterative reconstruction technique. COMPARISON:  06/17/22 CT Brain FINDINGS: Brain: No evidence of acute infarction, hemorrhage, hydrocephalus, extra-axial collection or mass lesion/mass effect. Sequela mild chronic microvascular ischemic change.  Mineralization of the basal ganglia. Vascular: No hyperdense vessel or unexpected calcification. Vascular calcification of the carotid siphons bilaterally. Skull: Normal. Negative for fracture or focal lesion. Sinuses/Orbits: Mucosal thickening right maxillary sinus. Bilateral lens replacement. Other: None. IMPRESSION: No evidence of intracranial  injury. Electronically Signed   By: Marin Roberts M.D.   On: 08/12/2022 12:06    Microbiology: Results for orders placed or performed during the hospital encounter of 05/10/21  Resp Panel by RT-PCR (Flu A&B, Covid) Nasopharyngeal Swab     Status: None   Collection Time: 05/10/21  8:13 AM   Specimen: Nasopharyngeal Swab; Nasopharyngeal(NP) swabs in vial transport medium  Result Value Ref Range Status   SARS Coronavirus 2 by RT PCR NEGATIVE NEGATIVE Final    Comment: (NOTE) SARS-CoV-2 target nucleic acids are NOT DETECTED.  The SARS-CoV-2 RNA is generally detectable in upper respiratory specimens during the acute phase of infection. The lowest concentration of SARS-CoV-2 viral copies this assay can detect is 138 copies/mL. A negative result does not preclude SARS-Cov-2 infection and should not be used as the sole basis for treatment or other patient management decisions. A negative result may occur with  improper specimen collection/handling, submission of specimen other than nasopharyngeal swab, presence of viral mutation(s) within the areas targeted by this assay, and inadequate number of viral copies(<138 copies/mL). A negative result must be combined with clinical observations, patient history, and epidemiological information. The expected result is Negative.  Fact Sheet for Patients:  EntrepreneurPulse.com.au  Fact Sheet for Healthcare Providers:  IncredibleEmployment.be  This test is no t yet approved or cleared by the [place] FDA and  has been authorized for detection and/or diagnosis of  SARS-CoV-2 by FDA under an Emergency Use Authorization (EUA). This EUA will remain  in effect (meaning this test can be used) for the duration of the COVID-19 declaration under Section 564(b)(1) of the Act, 21 U.S.C.section 360bbb-3(b)(1), unless the authorization is terminated  or revoked sooner.       Influenza A by PCR NEGATIVE NEGATIVE Final   Influenza B by PCR NEGATIVE NEGATIVE Final    Comment: (NOTE) The Xpert Xpress SARS-CoV-2/FLU/RSV plus assay is intended as an aid in the diagnosis of influenza from Nasopharyngeal swab specimens and should not be used as a sole basis for treatment. Nasal washings and aspirates are unacceptable for Xpert Xpress SARS-CoV-2/FLU/RSV testing.  Fact Sheet for Patients: EntrepreneurPulse.com.au  Fact Sheet for Healthcare Providers: IncredibleEmployment.be  This test is not yet approved or cleared by the [place] FDA and has been authorized for detection and/or diagnosis of SARS-CoV-2 by FDA under an Emergency Use Authorization (EUA). This EUA will remain in effect (meaning this test can be used) for the duration of the COVID-19 declaration under Section 564(b)(1) of the Act, 21 U.S.C. section 360bbb-3(b)(1), unless the authorization is terminated or revoked.  Performed at [facility], [place] [address] [place], [place] [address]     Labs: CBC: Recent Labs  Lab 08/12/22 1117  WBC 8.7  NEUTROABS 6.6  HGB 13.6  HCT 42.8  MCV 92.2  PLT 681   Basic Metabolic Panel: Recent Labs  Lab 08/12/22 1117 08/13/22 0120  NA 137 139  K 4.2 4.8  CL 104 103  CO2 23 24  GLUCOSE 174* 70  BUN 25* 26*  CREATININE 1.84* 1.76*  CALCIUM 8.9 9.0   Liver Function Tests: Recent Labs  Lab 08/12/22 1117 08/13/22 0120  AST 27 34  ALT 45* 47*  ALKPHOS 99 100  BILITOT 0.9 0.6  PROT 5.0* 5.0*  ALBUMIN 2.3* 2.4*   CBG: Recent Labs  Lab 08/13/22 1230 08/13/22 1740 08/13/22 2026 08/14/22 0747  08/14/22 1128  GLUCAP 320* 246* 226* 229* 257*    Discharge time spent: approximately  55 minutes spent on discharge counseling, evaluation of patient on day of discharge, and coordination of discharge planning with nursing, social work, pharmacy and case management  Signed: Edwin Dada, MD [facility] 08/14/2022

## 2022-08-14 NOTE — Progress Notes (Signed)
Pt daughter at bedside requesting to speak with case management at bedside.Notified Cedric Fishman, LCSW and Ronaldo Miyamoto, RN case management via [employer] of request.

## 2022-08-14 NOTE — Progress Notes (Signed)
Pt woke up confused and agitated, bed alarm activated, refused assistance while going to the bathroom and also refused to use the front wheel walker. Pt was very unsteady and was going to fall on his back so this RN offered help and encouraged pt to use the walker but instead the pt raised his arm and acted like he was going to hit this Therapist, sports. I called for the charge RN and CNA to assist the pt and informed the pt of the possible consequences of hitting a staff. Charge RN assisted the pt from the bathroom to the bed safely. Pt refused tele monitoring, MD and tele tech informed. Bed alarm on, floor mat in place. Will continue to monitor pt.

## 2022-08-14 NOTE — Progress Notes (Signed)
Mobility Specialist Progress Note:   08/14/22 0940  Mobility  Activity Ambulated with assistance to bathroom  Level of Assistance Minimal assist, patient does 75% or more  Assistive Device Front wheel walker  Distance Ambulated (ft) 15 ft  Activity Response Tolerated well  Mobility Referral Yes  $Mobility charge 1 Mobility   Pt requesting assistance to BR. Required min-modA to stand with a posterior lean upon standing. Ambulated with minG assist to BR. NT in room to [place] Specialist Please contact via SecureChat or  Rehab office at [phone]

## 2022-08-14 NOTE — TOC Transition Note (Signed)
Transition of Care [facility]) - CM/SW Discharge Note   Patient Details  Name: Fernando Hess MRN: [id number] Date of Birth: 02/21/1946  Transition of Care [facility]) CM/SW Contact:  Cyndi Bender, RN Phone Number: 08/14/2022, 12:29 PM   Clinical Narrative:    Patient stable for discharge.  [place] with [facility] aware of discharge. Patient has all needed DME. TOC will sign off.    Final next level of care: [place] Barriers to Discharge: Barriers Resolved   Patient Goals and CMS Choice Patient states their goals for this hospitalization and ongoing recovery are:: return home CMS Medicare.gov Compare Post Acute Care list provided to:: Patient Choice offered to / list presented to : Patient  Discharge Placement               home        Discharge Plan and Services   Discharge Planning Services: CM Consult Post Acute Care Choice: Home Health, Durable Medical Equipment          DME Arranged: Walker rolling with seat DME Agency: [employer] Date DME Agency Contacted: 08/13/22 Time DME Agency Contacted: [phone] Representative spoke with at DME Agency: [place]: RN, PT, OT, Speech Therapy [place] Agency: [place] ([place]) Date [place]: 08/13/22 Time [place]: 1612 Representative spoke with at [place]: [place] ([place]) Interventions     Readmission Risk Interventions     No data to display

## 2022-08-15 LAB — VITAMIN E
Vitamin E (Alpha Tocopherol): 11.5 mg/L (ref 9.0–29.0)
Vitamin E(Gamma Tocopherol): 0.3 mg/L — ABNORMAL LOW (ref 0.5–4.9)

## 2022-08-15 LAB — VITAMIN B1: Vitamin B1 (Thiamine): 161.2 nmol/L (ref 66.5–200.0)

## 2022-08-15 LAB — METHYLMALONIC ACID, SERUM: Methylmalonic Acid, Quantitative: 230 nmol/L (ref 0–378)

## 2022-08-21 LAB — ZINC: Zinc: 61 ug/dL (ref 44–115)

## 2022-08-21 LAB — COPPER, SERUM: Copper: 119 ug/dL (ref 69–132)

## 2022-09-04 ENCOUNTER — Other Ambulatory Visit: Payer: Self-pay

## 2022-09-04 ENCOUNTER — Emergency Department (HOSPITAL_COMMUNITY): Payer: Medicare Other

## 2022-09-04 ENCOUNTER — Encounter (HOSPITAL_COMMUNITY): Payer: Self-pay | Admitting: Internal Medicine

## 2022-09-04 ENCOUNTER — Observation Stay (HOSPITAL_COMMUNITY)
Admission: EM | Admit: 2022-09-04 | Discharge: 2022-09-05 | Disposition: A | Discharge status: Home Health | Payer: Medicare Other | Attending: Internal Medicine | Admitting: Internal Medicine

## 2022-09-04 DIAGNOSIS — E113312 Type 2 diabetes mellitus with moderate nonproliferative diabetic retinopathy with macular edema, left eye: Secondary | ICD-10-CM | POA: Diagnosis present

## 2022-09-04 DIAGNOSIS — R531 Weakness: Secondary | ICD-10-CM | POA: Insufficient documentation

## 2022-09-04 DIAGNOSIS — I9589 Other hypotension: Secondary | ICD-10-CM

## 2022-09-04 DIAGNOSIS — Z1152 Encounter for screening for COVID-19: Secondary | ICD-10-CM | POA: Diagnosis not present

## 2022-09-04 DIAGNOSIS — Z951 Presence of aortocoronary bypass graft: Secondary | ICD-10-CM | POA: Insufficient documentation

## 2022-09-04 DIAGNOSIS — I129 Hypertensive chronic kidney disease with stage 1 through stage 4 chronic kidney disease, or unspecified chronic kidney disease: Secondary | ICD-10-CM | POA: Insufficient documentation

## 2022-09-04 DIAGNOSIS — E113512 Type 2 diabetes mellitus with proliferative diabetic retinopathy with macular edema, left eye: Secondary | ICD-10-CM | POA: Diagnosis not present

## 2022-09-04 DIAGNOSIS — I25810 Atherosclerosis of coronary artery bypass graft(s) without angina pectoris: Secondary | ICD-10-CM | POA: Diagnosis present

## 2022-09-04 DIAGNOSIS — I480 Paroxysmal atrial fibrillation: Secondary | ICD-10-CM | POA: Diagnosis not present

## 2022-09-04 DIAGNOSIS — N1832 Chronic kidney disease, stage 3b: Secondary | ICD-10-CM | POA: Diagnosis not present

## 2022-09-04 DIAGNOSIS — G7000 Myasthenia gravis without (acute) exacerbation: Secondary | ICD-10-CM | POA: Diagnosis present

## 2022-09-04 DIAGNOSIS — E1169 Type 2 diabetes mellitus with other specified complication: Secondary | ICD-10-CM

## 2022-09-04 DIAGNOSIS — I2510 Atherosclerotic heart disease of native coronary artery without angina pectoris: Secondary | ICD-10-CM | POA: Diagnosis not present

## 2022-09-04 DIAGNOSIS — Z79899 Other long term (current) drug therapy: Secondary | ICD-10-CM | POA: Diagnosis not present

## 2022-09-04 DIAGNOSIS — N179 Acute kidney failure, unspecified: Principal | ICD-10-CM | POA: Diagnosis present

## 2022-09-04 DIAGNOSIS — E1122 Type 2 diabetes mellitus with diabetic chronic kidney disease: Secondary | ICD-10-CM | POA: Diagnosis not present

## 2022-09-04 DIAGNOSIS — I951 Orthostatic hypotension: Secondary | ICD-10-CM | POA: Insufficient documentation

## 2022-09-04 DIAGNOSIS — Z794 Long term (current) use of insulin: Secondary | ICD-10-CM | POA: Diagnosis not present

## 2022-09-04 DIAGNOSIS — E861 Hypovolemia: Secondary | ICD-10-CM

## 2022-09-04 DIAGNOSIS — E119 Type 2 diabetes mellitus without complications: Secondary | ICD-10-CM

## 2022-09-04 DIAGNOSIS — R0602 Shortness of breath: Secondary | ICD-10-CM | POA: Insufficient documentation

## 2022-09-04 DIAGNOSIS — R262 Difficulty in walking, not elsewhere classified: Secondary | ICD-10-CM | POA: Diagnosis not present

## 2022-09-04 DIAGNOSIS — I10 Essential (primary) hypertension: Secondary | ICD-10-CM | POA: Diagnosis present

## 2022-09-04 DIAGNOSIS — G4733 Obstructive sleep apnea (adult) (pediatric): Secondary | ICD-10-CM

## 2022-09-04 DIAGNOSIS — Z7901 Long term (current) use of anticoagulants: Secondary | ICD-10-CM | POA: Insufficient documentation

## 2022-09-04 DIAGNOSIS — E11649 Type 2 diabetes mellitus with hypoglycemia without coma: Secondary | ICD-10-CM

## 2022-09-04 LAB — CBC
HCT: 44 % (ref 39.0–52.0)
Hemoglobin: 13.9 g/dL (ref 13.0–17.0)
MCH: 29.6 pg (ref 26.0–34.0)
MCHC: 31.6 g/dL (ref 30.0–36.0)
MCV: 93.8 fL (ref 80.0–100.0)
Platelets: 279 10*3/uL (ref 150–400)
RBC: 4.69 MIL/uL (ref 4.22–5.81)
RDW: 17 % — ABNORMAL HIGH (ref 11.5–15.5)
WBC: 9.2 10*3/uL (ref 4.0–10.5)
nRBC: 0 % (ref 0.0–0.2)

## 2022-09-04 LAB — COMPREHENSIVE METABOLIC PANEL
ALT: 30 U/L (ref 0–44)
AST: 27 U/L (ref 15–41)
Albumin: 2.9 g/dL — ABNORMAL LOW (ref 3.5–5.0)
Alkaline Phosphatase: 125 U/L (ref 38–126)
Anion gap: 7 (ref 5–15)
BUN: 28 mg/dL — ABNORMAL HIGH (ref 8–23)
CO2: 25 mmol/L (ref 22–32)
Calcium: 8.5 mg/dL — ABNORMAL LOW (ref 8.9–10.3)
Chloride: 111 mmol/L (ref 98–111)
Creatinine, Ser: 2.24 mg/dL — ABNORMAL HIGH (ref 0.61–1.24)
GFR, Estimated: 29 mL/min — ABNORMAL LOW (ref >60)
Glucose, Bld: 159 mg/dL — ABNORMAL HIGH (ref 70–99)
Potassium: 4.3 mmol/L (ref 3.5–5.1)
Sodium: 143 mmol/L (ref 135–145)
Total Bilirubin: 1.5 mg/dL — ABNORMAL HIGH (ref 0.3–1.2)
Total Protein: 5.9 g/dL — ABNORMAL LOW (ref 6.5–8.1)

## 2022-09-04 LAB — D-DIMER, QUANTITATIVE: D-Dimer, Quant: 0.27 ug/mL-FEU (ref 0.00–0.50)

## 2022-09-04 LAB — GLUCOSE, CAPILLARY
Glucose-Capillary: 101 mg/dL — ABNORMAL HIGH (ref 70–99)
Glucose-Capillary: 154 mg/dL — ABNORMAL HIGH (ref 70–99)

## 2022-09-04 LAB — RESP PANEL BY RT-PCR (RSV, FLU A&B, COVID)  RVPGX2
Influenza A by PCR: NEGATIVE
Influenza B by PCR: NEGATIVE
Resp Syncytial Virus by PCR: NEGATIVE
SARS Coronavirus 2 by RT PCR: NEGATIVE

## 2022-09-04 LAB — BRAIN NATRIURETIC PEPTIDE: B Natriuretic Peptide: 188.2 pg/mL — ABNORMAL HIGH (ref 0.0–100.0)

## 2022-09-04 LAB — CBG MONITORING, ED: Glucose-Capillary: 167 mg/dL — ABNORMAL HIGH (ref 70–99)

## 2022-09-04 MED ORDER — INSULIN ASPART 100 UNIT/ML IJ SOLN
5.0000 [IU] | Freq: Three times a day (TID) | INTRAMUSCULAR | Status: DC
  Administered 2022-09-05: 5 [IU] via SUBCUTANEOUS

## 2022-09-04 MED ORDER — PANTOPRAZOLE SODIUM 40 MG PO TBEC
40.0000 mg | DELAYED_RELEASE_TABLET | Freq: Every day | ORAL | Status: DC
  Administered 2022-09-05: 40 mg via ORAL
  Filled 2022-09-04: qty 1

## 2022-09-04 MED ORDER — ATORVASTATIN CALCIUM 40 MG PO TABS: 80.0000 mg | ORAL_TABLET | Freq: Every evening | ORAL | Status: DC

## 2022-09-04 MED ORDER — DULOXETINE HCL 30 MG PO CPEP
30.0000 mg | ORAL_CAPSULE | Freq: Every morning | ORAL | Status: DC
  Administered 2022-09-05: 30 mg via ORAL
  Filled 2022-09-04: qty 1

## 2022-09-04 MED ORDER — BASAGLAR KWIKPEN 100 UNIT/ML ~~LOC~~ SOPN: 25.0000 [IU] | PEN_INJECTOR | Freq: Every morning | SUBCUTANEOUS | Status: DC

## 2022-09-04 MED ORDER — ACETAMINOPHEN 325 MG PO TABS
650.0000 mg | ORAL_TABLET | Freq: Four times a day (QID) | ORAL | Status: DC | PRN
  Administered 2022-09-04: 650 mg via ORAL
  Filled 2022-09-04: qty 2

## 2022-09-04 MED ORDER — DULOXETINE HCL 30 MG PO CPEP
60.0000 mg | ORAL_CAPSULE | Freq: Every morning | ORAL | Status: DC
  Administered 2022-09-05: 60 mg via ORAL
  Filled 2022-09-04: qty 2

## 2022-09-04 MED ORDER — APIXABAN 5 MG PO TABS
5.0000 mg | ORAL_TABLET | Freq: Two times a day (BID) | ORAL | Status: DC
  Administered 2022-09-05: 5 mg via ORAL
  Filled 2022-09-04: qty 1

## 2022-09-04 MED ORDER — SODIUM CHLORIDE 0.9 % IV SOLN: INTRAVENOUS | Status: DC

## 2022-09-04 MED ORDER — ACETAMINOPHEN 650 MG RE SUPP: 650.0000 mg | Freq: Four times a day (QID) | RECTAL | Status: DC | PRN

## 2022-09-04 MED ORDER — SODIUM CHLORIDE 0.9 % IV BOLUS
500.0000 mL | Freq: Once | INTRAVENOUS | Status: AC
  Administered 2022-09-04: 500 mL via INTRAVENOUS

## 2022-09-04 MED ORDER — INSULIN GLARGINE-YFGN 100 UNIT/ML ~~LOC~~ SOLN
25.0000 [IU] | Freq: Every day | SUBCUTANEOUS | Status: DC
  Administered 2022-09-05: 25 [IU] via SUBCUTANEOUS
  Filled 2022-09-04: qty 0.25

## 2022-09-04 MED ORDER — INSULIN ASPART 100 UNIT/ML IJ SOLN
0.0000 [IU] | Freq: Three times a day (TID) | INTRAMUSCULAR | Status: DC
  Administered 2022-09-05: 2 [IU] via SUBCUTANEOUS

## 2022-09-04 MED ORDER — MAGNESIUM 200 MG PO TABS: 350.0000 mg | ORAL_TABLET | Freq: Every evening | ORAL | Status: DC

## 2022-09-04 MED ORDER — INSULIN LISPRO (1 UNIT DIAL) 100 UNIT/ML (KWIKPEN): 5.0000 [IU] | PEN_INJECTOR | Freq: Three times a day (TID) | SUBCUTANEOUS | Status: DC

## 2022-09-04 NOTE — H&P (Signed)
History and Physical    Fernando Hess  LZJ:[id number]  DOB: October 10, 1945  DOA: 09/04/2022 PCP: [name], MD   Patient coming from: Home  Chief Complaint: Weakness  HPI: Fernando Hess is a 76 y.o. male with medical history of atrial fibrillation, coronary artery disease, chronic diastolic heart failure diabetes mellitus and obstructive sleep apnea.  The patient states that he has had a new diagnosis of myasthenia gravis and a neurologist who has been giving him treatments for this.  Despite these IV treatments, the patient has not noted any improvement in the strength in his lower extremities.  He presents to the hospital for worsening and weakness.  He was noted to be hypotensive when EMS evaluated him with systolic blood pressures in the 70s.  He was given IV fluids and brought to the hospital.  He states that other than significant weakness, he has had some back pain over the past 3 months present in his lower back.  He has no other specific complaints.  ED Course: Given 500 cc of normal saline after he was noted to have a creatinine that was elevated at 224  Review of Systems:  All other systems reviewed and apart from HPI, are negative.  Medical history: Coronary artery disease status post CABG Chronic kidney disease Atrial fibrillation Diabetes mellitus requiring insulin Chronic diastolic heart failure Possible myasthenia gravis Obstructive sleep apnea Morbid obesity    Past Surgical History:  Procedure Laterality Date   [place] N/A 09/26/2015   Procedure: Left Heart Cath and Coronary Angiography;  Surgeon: Troy Sine, MD;  Location: [place] [facility];  Service: Cardiovascular;  Laterality: N/A;   CARDIAC CATHETERIZATION N/A 10/22/2015   Procedure: Left Heart Cath and Coronary Angiography;  Surgeon: Sherren Mocha, MD;  Location: [place] [facility];  Service: Cardiovascular;  Laterality: N/A;   CATARACT EXTRACTION W/  INTRAOCULAR LENS  IMPLANT, BILATERAL  2017   COLONOSCOPY     CORONARY ARTERY BYPASS GRAFT N/A 10/02/2015   Procedure: CORONARY ARTERY BYPASS GRAFTING (CABG) X5 LIMA-LAD; SVG-DIAG; SVG-OM; SVG-PD; SVG-RAMUS TRANSESOPHAGEAL ECHOCARDIOGRAM (TEE) ENDOSCOPIC GREATER SAPHENOUS VEIN  HARVEST BILAT LE;  Surgeon: Ivin Poot, MD;  Location: [place];  Service: Open Heart Surgery;  Laterality: N/A;   CYSTOSCOPY/URETEROSCOPY/HOLMIUM LASER/STENT PLACEMENT Left 08/10/2018   Procedure: CYSTOSCOPY/URETEROSCOPY/HOLMIUM LASER/STENT PLACEMENT;  Surgeon: Festus Aloe, MD;  Location: [facility];  Service: Urology;  Laterality: Left;   CYSTOSCOPY/URETEROSCOPY/HOLMIUM LASER/STENT PLACEMENT Left 09/10/2018   Procedure: CYSTOSCOPY/URETEROSCOPY/HOLMIUM LASER/STENT EXCHANGE;  Surgeon: Festus Aloe, MD;  Location: [facility];  Service: Urology;  Laterality: Left;   LEFT HEART CATHETERIZATION WITH CORONARY ANGIOGRAM N/A 04/13/2014   Procedure: LEFT HEART CATHETERIZATION WITH CORONARY ANGIOGRAM;  Surgeon: Jacolyn Reedy, MD;  Location: [facility];  Service: Cardiovascular;  Laterality: N/A;   LEG SURGERY Right age 59   closed reduction leg fracture   NASAL SEPTOPLASTY W/ TURBINOPLASTY Bilateral 08/30/2021   Procedure: NASAL SEPTOPLASTY WITH BILATERAL INFERIOR TURBINATE REDUCTION;  Surgeon: Jerrell Belfast, MD;  Location: [place];  Service: ENT;  Laterality: Bilateral;   POLYPECTOMY     RIGHT HEART CATH N/A 06/06/2021   Procedure: RIGHT HEART CATH;  Surgeon: Jolaine Artist, MD;  Location: [place] [facility];  Service: Cardiovascular;  Laterality: N/A;   TEE WITHOUT CARDIOVERSION N/A 10/02/2015   Procedure: TRANSESOPHAGEAL ECHOCARDIOGRAM (TEE);  Surgeon: Ivin Poot, MD;  Location: [place];  Service: Open Heart Surgery;  Laterality: N/A;  URETEROSCOPY WITH HOLMIUM LASER LITHOTRIPSY Bilateral 2004;  2005  dr grapey  '@[facility]'$    VASECTOMY      Social History:   reports that he has never smoked. He has  never used smokeless tobacco. He reports that he does not currently use alcohol. He reports that he does not use drugs.  Allergies  Allergen Reactions   Cilostazol Swelling and Other (See Comments)    Edema    Dulaglutide Nausea And Vomiting and Other (See Comments)    TRULICITY   Levofloxacin Hives, Itching and Rash   Liraglutide Other (See Comments)    Severe fatigue & insomnia   Lisinopril Itching, Rash and Cough    Family History  Problem Relation Age of Onset   Heart attack Mother    Heart attack Father    Diabetes Brother    Pancreatic cancer Brother    Diabetes Brother    Colon cancer Neg Hx    Esophageal cancer Neg Hx    Prostate cancer Neg Hx    Rectal cancer Neg Hx    Stomach cancer Neg Hx    Colon polyps Neg Hx      Prior to Admission medications   Medication Sig Start Date End Date Taking? Authorizing Provider  acetaminophen (TYLENOL) 325 MG tablet Take 325-650 mg by mouth every 6 (six) hours as needed for headache or mild pain.    [provider]  apixaban (ELIQUIS) 5 MG TABS tablet TAKE ONE TABLET BY MOUTH EVERY MORNING and TAKE ONE TABLET BY MOUTH EVERY EVENING Patient taking differently: Take 5 mg by mouth in the morning and at bedtime. 03/13/22   Freada Bergeron, MD  atorvastatin (LIPITOR) 80 MG tablet Take 1 tablet (80 mg total) by mouth daily. Patient taking differently: Take 80 mg by mouth every evening. 11/07/21 08/12/22  [name], [name]  B-D UF III MINI PEN NEEDLES 31G X 5 MM MISC USE AS DIRECTED WITH LANTUS SOLOSTAR 04/02/19   [provider]  Continuous Blood Gluc Sensor (FREESTYLE LIBRE 2 SENSOR) MISC Inject 1 Device into the skin every 14 (fourteen) days.    [provider]  diphenhydrAMINE (BENADRYL ALLERGY) 25 mg capsule Take 25 mg by mouth every 6 (six) hours as needed for allergies.    [provider]  DULoxetine (CYMBALTA) 30 MG capsule Take 30 mg by mouth every morning.    [provider]   DULoxetine (CYMBALTA) 60 MG capsule Take 60 mg by mouth every morning. 04/12/18   [provider]  furosemide (LASIX) 20 MG tablet Take 20 mg by mouth every Monday, Wednesday, and Friday.    [provider]  guaiFENesin-codeine (ROBITUSSIN AC) 100-10 MG/5ML syrup take 10m BY MOUTH EVERY 4 HOURS AS NEEDED Patient taking differently: Take 10 mLs by mouth every 4 (four) hours as needed for cough or congestion. 06/10/22   [name], MD  Insulin Glargine (BASAGLAR KWIKPEN) 100 UNIT/ML SOPN Inject 40 Units into the skin in the morning.    [provider]  insulin lispro (HUMALOG) 100 UNIT/ML KwikPen Inject 8 Units into the skin 3 (three) times daily with meals.    [provider]  MAGNESIUM PO Take 350 mg by mouth every evening.    [provider]  Multiple Vitamins-Minerals (SENIOR MULTIVITAMIN PLUS PO) Take 1 tablet by mouth daily with breakfast.    [provider]  nitroGLYCERIN (NITROSTAT) 0.4 MG SL tablet Place 0.4 mg under the tongue every 5 (five) minutes as needed  for chest pain.    [provider]  nystatin (MYCOSTATIN/NYSTOP) powder Apply 1 Application topically See admin instructions. Apply to the groin in the morning and evening    [provider]  nystatin cream (MYCOSTATIN) Apply 1 Application topically See admin instructions. Apply to the groin in the morning and evening when not using the nystatin powder    [provider]  OCTAGAM 20 GM/200ML SOLN Inject into the vein. 05/07/22   [provider]  ondansetron (ZOFRAN-ODT) 4 MG disintegrating tablet Take 4 mg by mouth every 8 (eight) hours as needed for nausea or vomiting (dissolve orally).    [provider]  pantoprazole (PROTONIX) 40 MG tablet TAKE 1 TABLET BY MOUTH EVERY DAY Patient taking differently: Take 40 mg by mouth in the morning. 11/25/19   [name], MD    Physical Exam: Wt Readings from Last 3 Encounters:  09/04/22  79.4 kg  08/14/22 81.7 kg  07/17/22 79.7 kg   Vitals:   09/04/22 1519 09/04/22 1520 09/04/22 1545  BP: 104/76  (!) 113/95  Pulse: 88  86  Resp: 19  (!) 24  Temp: (!) 97.5 F (36.4 C)    SpO2: 100%  99%  Weight:  79.4 kg   Height:  '5\' 6"'$  (1.676 m)       Constitutional:  Calm & comfortable Eyes: PERRLA, lids and conjunctivae normal ENT:  Mucous membranes are moist.  Pharynx clear of exudate   Normal dentition.  Respiratory:  Clear to auscultation bilaterally  Normal respiratory effort.  Cardiovascular:  S1 & S2 heard, regular rate and rhythm No Murmurs Abdomen:  Non distended No tenderness, No masses Bowel sounds normal Extremities:  No clubbing / cyanosis No pedal edema  Skin:  No rashes, lesions or ulcers Neurologic:  AAO x 3 CN 2-12 grossly intact Sensation intact Strength 5/5 in all 4 extremities Psychiatric:  Normal Mood and affect    Labs on Admission: I have personally reviewed following labs and imaging studies  CBC: Recent Labs  Lab 09/04/22 1500  WBC 9.2  HGB 13.9  HCT 44.0  MCV 93.8  PLT 443   Basic Metabolic Panel: Recent Labs  Lab 09/04/22 1500  NA 143  K 4.3  CL 111  CO2 25  GLUCOSE 159*  BUN 28*  CREATININE 2.24*  CALCIUM 8.5*   GFR: Estimated Creatinine Clearance: 26.9 mL/min (A) (by C-G formula based on SCr of 2.24 mg/dL (H)). Liver Function Tests: Recent Labs  Lab 09/04/22 1500  AST 27  ALT 30  ALKPHOS 125  BILITOT 1.5*  PROT 5.9*  ALBUMIN 2.9*   BNP (last 3 results) Recent Labs    11/06/21 1031  PROBNP 661*   HbA1C: No results for input(s): "HGBA1C" in the last 72 hours. CBG: Recent Labs  Lab 09/04/22 1452  GLUCAP 167*     Radiological Exams on Admission: DG Chest Portable 1 View  Result Date: 09/04/2022 CLINICAL DATA:  Weakness, shortness of breath EXAM: PORTABLE CHEST 1 VIEW COMPARISON:  06/17/2022 FINDINGS: Cardiomegaly status post median sternotomy and CABG. Both lungs are clear. The  visualized skeletal structures are unremarkable. IMPRESSION: Cardiomegaly without acute abnormality of the lungs in AP portable projection. Electronically Signed   By: Delanna Ahmadi M.D.   On: 09/04/2022 16:11      Assessment/Plan Principal Problem:   AKI (acute kidney injury)-CKD stage IIIb -His baseline creatinine is about 1.7-1.9 - Creatinine today is 2.24 - I will continue normal saline at 75 cc  an hour for the next 12 hours and hold Lasix - Recheck creatinine tomorrow morning   Active Problems: Hypotension - Likely secondary to volume depletion - BP has improved after being given IV fluids  Generalized weakness-possible myasthenia gravis - The patient states that he has had various opinions on whether or not he has myasthenia gravis - he has received IVIG as oupt - I will ask PT to assess him tomorrow morning  Hypoglycemia-type 2 diabetes mellitus with diabetic retinopathy - In the ED, his sugar is noted to be 59- despite this he is mentating well and able to give an accurate history - His last A1c checked on 11/29 was 14.6 - Due to current hypoglycemia, I will cut back on the dose of his long and short acting insulin while he is in the hospital  CAD S/P CABG x 5 -Continue Lipitor  OSA on CPAP -I will order CPAP    Paroxysmal atrial fibrillation ([place]) -Continue Eliquis-based on his home medication list, he does not appear to be on any rate controlling agents    DVT prophylaxis: Eliquis Code Status: Full code Consults called: None Admission status: Observation status Level of care: Med-Surg  Debbe Odea MD [facility]    09/04/2022, 5:17 PM

## 2022-09-04 NOTE — ED Provider Notes (Signed)
[place] DEPT Provider Note   CSN: [id number] Arrival date & time: 09/04/22  1445     History  Chief Complaint  Patient presents with  . Weakness    Fernando Hess is a 76 y.o. male.   Weakness    Patient has a history of hyperlipidemia, hypertension, coronary artery disease, atrial fibrillation, mild aortic stenosis, depression, myasthenia gravis.  Patient states he presents to the ED for evaluation of increasing weakness malaise decreased appetite for the last couple of weeks.  Patient states he has been feeling short of breath.  Any activity makes him get winded.  He has been feeling weak in the legs especially when he tries to walk using his walking roller.  Patient states he was diagnosed with myasthenia gravis several months ago.  He states he was put on treatments for that including steroids.  Patient states that he does not feel like he is getting any better. Patient was last admitted to the hospital on November 28.  He was discharged on November 30.  At that time neurology evaluated the patient.  Apparently they did not feel there was any data to support the diagnosis of myasthenia gravis.  Recommendation was to follow-up with neurology   Nurse reported that the patient was hypotensive as well as hypoxic at home.  Currently he has a normal blood pressure and normal oxygen saturation  Home Medications Prior to Admission medications   Medication Sig Start Date End Date Taking? Authorizing Provider  acetaminophen (TYLENOL) 325 MG tablet Take 325-650 mg by mouth every 6 (six) hours as needed for headache or mild pain.    [provider]  apixaban (ELIQUIS) 5 MG TABS tablet TAKE ONE TABLET BY MOUTH EVERY MORNING and TAKE ONE TABLET BY MOUTH EVERY EVENING Patient taking differently: Take 5 mg by mouth in the morning and at bedtime. 03/13/22   Freada Bergeron, MD  atorvastatin (LIPITOR) 80 MG tablet Take 1 tablet (80 mg total) by mouth  daily. Patient taking differently: Take 80 mg by mouth every evening. 11/07/21 08/12/22  [name], [name]  B-D UF III MINI PEN NEEDLES 31G X 5 MM MISC USE AS DIRECTED WITH LANTUS SOLOSTAR 04/02/19   [provider]  Continuous Blood Gluc Sensor (FREESTYLE LIBRE 2 SENSOR) MISC Inject 1 Device into the skin every 14 (fourteen) days.    [provider]  diphenhydrAMINE (BENADRYL ALLERGY) 25 mg capsule Take 25 mg by mouth every 6 (six) hours as needed for allergies.    [provider]  DULoxetine (CYMBALTA) 30 MG capsule Take 30 mg by mouth every morning.    [provider]  DULoxetine (CYMBALTA) 60 MG capsule Take 60 mg by mouth every morning. 04/12/18   [provider]  furosemide (LASIX) 20 MG tablet Take 20 mg by mouth every Monday, Wednesday, and Friday.    [provider]  guaiFENesin-codeine (ROBITUSSIN AC) 100-10 MG/5ML syrup take 76m BY MOUTH EVERY 4 HOURS AS NEEDED Patient taking differently: Take 10 mLs by mouth every 4 (four) hours as needed for cough or congestion. 06/10/22   [name], MD  Insulin Glargine (BASAGLAR KWIKPEN) 100 UNIT/ML SOPN Inject 40 Units into the skin in the morning.    [provider]  insulin lispro (HUMALOG) 100 UNIT/ML KwikPen Inject 8 Units into the skin 3 (three) times daily with meals.    [provider]  MAGNESIUM PO Take 350 mg by mouth every evening.  [provider]  Multiple Vitamins-Minerals (SENIOR MULTIVITAMIN PLUS PO) Take 1 tablet by mouth daily with breakfast.    [provider]  nitroGLYCERIN (NITROSTAT) 0.4 MG SL tablet Place 0.4 mg under the tongue every 5 (five) minutes as needed for chest pain.    [provider]  nystatin (MYCOSTATIN/NYSTOP) powder Apply 1 Application topically See admin instructions. Apply to the groin in the morning and evening    [provider]  nystatin cream (MYCOSTATIN) Apply 1 Application topically See  admin instructions. Apply to the groin in the morning and evening when not using the nystatin powder    [provider]  OCTAGAM 20 GM/200ML SOLN Inject into the vein. 05/07/22   [provider]  ondansetron (ZOFRAN-ODT) 4 MG disintegrating tablet Take 4 mg by mouth every 8 (eight) hours as needed for nausea or vomiting (dissolve orally).    [provider]  pantoprazole (PROTONIX) 40 MG tablet TAKE 1 TABLET BY MOUTH EVERY DAY Patient taking differently: Take 40 mg by mouth in the morning. 11/25/19   [name], MD      Allergies    Cilostazol, Dulaglutide, Levofloxacin, Liraglutide, and Lisinopril    Review of Systems   Review of Systems  Neurological:  Positive for weakness.    Physical Exam Updated Vital Signs BP (!) 113/95 (BP Location: Right Arm)   Pulse 86   Temp (!) 97.5 F (36.4 C)   Resp (!) 24   Ht 1.676 m ('5\' 6"'$ )   Wt 79.4 kg   SpO2 99%   BMI 28.25 kg/m  Physical Exam Vitals and nursing note reviewed.  Constitutional:      Appearance: He is well-developed. He is not diaphoretic.  HENT:     Head: Normocephalic and atraumatic.     Right Ear: External ear normal.     Left Ear: External ear normal.  Eyes:     General: No scleral icterus.       Right eye: No discharge.        Left eye: No discharge.     Conjunctiva/sclera: Conjunctivae normal.  Neck:     Trachea: No tracheal deviation.  Cardiovascular:     Rate and Rhythm: Normal rate and regular rhythm.  Pulmonary:     Effort: Pulmonary effort is normal. No respiratory distress.     Breath sounds: Normal breath sounds. No stridor. No wheezing or rales.  Abdominal:     General: Bowel sounds are normal. There is no distension.     Palpations: Abdomen is soft.     Tenderness: There is no abdominal tenderness. There is no guarding or rebound.  Musculoskeletal:        General: No tenderness or deformity.     Cervical back: Neck supple.  Skin:    General: Skin is warm and dry.      Findings: No rash.  Neurological:     General: No focal deficit present.     Mental Status: He is alert.     Cranial Nerves: No cranial nerve deficit, dysarthria or facial asymmetry.     Sensory: No sensory deficit.     Motor: No abnormal muscle tone or seizure activity.     Coordination: Coordination normal.     Comments: Good grip strength bilaterally, normal lower extremity plantar flexion and dorsiflexion strength bilaterally  Psychiatric:        Mood and Affect: Mood normal.     ED Results / Procedures / Treatments   Labs (  all labs ordered are listed, but only abnormal results are displayed) Labs Reviewed  CBC - Abnormal; Notable for the following components:      Result Value   RDW 17.0 (*)    All other components within normal limits  COMPREHENSIVE METABOLIC PANEL - Abnormal; Notable for the following components:   Glucose, Bld 159 (*)    BUN 28 (*)    Creatinine, Ser 2.24 (*)    Calcium 8.5 (*)    Total Protein 5.9 (*)    Albumin 2.9 (*)    Total Bilirubin 1.5 (*)    GFR, Estimated 29 (*)    All other components within normal limits  BRAIN NATRIURETIC PEPTIDE - Abnormal; Notable for the following components:   B Natriuretic Peptide 188.2 (*)    All other components within normal limits  CBG MONITORING, ED - Abnormal; Notable for the following components:   Glucose-Capillary 167 (*)    All other components within normal limits  RESP PANEL BY RT-PCR (RSV, FLU A&B, COVID)  [id number]  D-DIMER, QUANTITATIVE    EKG None  Radiology DG Chest Portable 1 View  Result Date: 09/04/2022 CLINICAL DATA:  Weakness, shortness of breath EXAM: PORTABLE CHEST 1 VIEW COMPARISON:  06/17/2022 FINDINGS: Cardiomegaly status post median sternotomy and CABG. Both lungs are clear. The visualized skeletal structures are unremarkable. IMPRESSION: Cardiomegaly without acute abnormality of the lungs in AP portable projection. Electronically Signed   By: Delanna Ahmadi M.D.   On: 09/04/2022 16:11     Procedures Procedures    Medications Ordered in ED Medications  sodium chloride 0.9 % bolus 500 mL (500 mLs Intravenous New Bag/Given 09/04/22 1525)    ED Course/ Medical Decision Making/ A&P Clinical Course as of 09/04/22 1644  Thu Sep 04, 2022  1623 Comprehensive metabolic panel(!) Creatinine elevated compared to previous values [JK]  1623 CBC(!) CBC normal [JK]  1623 D-dimer, quantitative D-dimer negative [JK]  1623 Resp panel by RT-PCR (RSV, Flu A&B, Covid) Anterior Nasal Swab Negative [JK]  1623 Comprehensive metabolic panel(!) Bilirubin increased compared to previous [JK]  1623 DG Chest Portable 1 View X-ray without acute findings [JK]  1643 D/w Dr Wynelle [place] regarding admission [JK]    Clinical Course User Index [JK] Dorie Rank, MD                           Medical Decision Making Problems Addressed: AKI (acute kidney injury) [facility]): acute illness or injury that poses a threat to life or bodily functions Weakness: acute illness or injury  Amount and/or Complexity of Data Reviewed Labs: ordered. Decision-making details documented in ED Course. Radiology: ordered and independent interpretation performed. Decision-making details documented in ED Course.   Patient presented to the ED for malaise weakness.  Apparently noted to be hypoxic and hypotensive by EMS.  Patient has remained normotensive with normal oxygen saturation in the ED.  No findings at this time to suggest evolving sepsis.  Patient has been having some chronic weakness felt to be related to possible myasthenia gravis although most recent neurology evaluation at [facility] health did not feel that that was entirely consistent.  Patient's labs are notable for increased creatinine.  Certainly possible there could be a component of dehydration contributing to his weakness.  He is not having any vomiting or diarrhea or ongoing fluid losses although he is on a diuretic.  Patient has been given IV fluids.  I will consult  with the medical service for  admission, overnight observation hydration         Final Clinical Impression(s) / ED Diagnoses Final diagnoses:  AKI (acute kidney injury) ([place])  Weakness    Rx / DC Orders ED Discharge Orders     None         Dorie Rank, MD 09/04/22 1644

## 2022-09-04 NOTE — ED Triage Notes (Signed)
Patient brought in from home by EMS with c/o increased weakness, malaise and decrease appetite x2 weeks. Per EMS patient is hypotensive 70/50 HX of Afib and on thinner. 20g to R hand  800cc of NS given by EMS, HR 90 CBG 140

## 2022-09-05 DIAGNOSIS — N179 Acute kidney failure, unspecified: Secondary | ICD-10-CM | POA: Diagnosis not present

## 2022-09-05 DIAGNOSIS — I951 Orthostatic hypotension: Secondary | ICD-10-CM | POA: Diagnosis not present

## 2022-09-05 DIAGNOSIS — R531 Weakness: Secondary | ICD-10-CM | POA: Diagnosis not present

## 2022-09-05 LAB — BASIC METABOLIC PANEL
Anion gap: 6 (ref 5–15)
BUN: 29 mg/dL — ABNORMAL HIGH (ref 8–23)
CO2: 24 mmol/L (ref 22–32)
Calcium: 8.1 mg/dL — ABNORMAL LOW (ref 8.9–10.3)
Chloride: 110 mmol/L (ref 98–111)
Creatinine, Ser: 2 mg/dL — ABNORMAL HIGH (ref 0.61–1.24)
GFR, Estimated: 34 mL/min — ABNORMAL LOW (ref >60)
Glucose, Bld: 139 mg/dL — ABNORMAL HIGH (ref 70–99)
Potassium: 5.3 mmol/L — ABNORMAL HIGH (ref 3.5–5.1)
Sodium: 140 mmol/L (ref 135–145)

## 2022-09-05 LAB — GLUCOSE, CAPILLARY
Glucose-Capillary: 80 mg/dL (ref 70–99)
Glucose-Capillary: 162 mg/dL — ABNORMAL HIGH (ref 70–99)

## 2022-09-05 MED ORDER — FUROSEMIDE 20 MG PO TABS: 10.0000 mg | ORAL_TABLET | ORAL | 0 refills | Status: DC

## 2022-09-05 MED ORDER — SODIUM CHLORIDE 0.9 % IV BOLUS
500.0000 mL | Freq: Once | INTRAVENOUS | Status: AC
  Administered 2022-09-05: 500 mL via INTRAVENOUS

## 2022-09-05 NOTE — Discharge Summary (Signed)
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 male with medical history of atrial fibrillation, coronary artery disease, chronic diastolic heart failure diabetes mellitus and obstructive sleep apnea.  The patient states that he has had a new diagnosis of myasthenia gravis and a neurologist who has been giving him treatments for this.  Despite these IV treatments, the patient has not noted any improvement in the strength in his lower extremities.  He presents to the hospital for worsening and weakness.  He was noted to be hypotensive when EMS evaluated him with systolic blood pressures in the 70s.  He was given IV fluids and brought to the hospital.  He states that other than significant weakness, he has had some back pain over the past 3 months present in his lower back.  He has no other specific complaints.   Principal Problem:   AKI (acute kidney injury)-CKD stage IIIb -His baseline creatinine is about 1.7-1.9 - Creatinine today is 2.00 today - I have recommended that he stop his Lasix for now and be re-assess by his PCP in 1-2 wks     Active Problems: Orthostatic Hypotension -  Likely secondary to volume depletion - BP has improved after being given IV fluids but continues to drop when he stands - he is asymptomatic and is able to ambulate > 100 ft in the hall w/o issues - I will be discharging him home with TEDS and recommendations for close outpt follow up- we will hold his Lasix   Generalized weakness-possible myasthenia gravis - The patient states that he has had various opinions on whether or not he has myasthenia gravis - he has received IVIG as oupt    Hypoglycemia-type 2 diabetes mellitus with diabetic retinopathy - In the ED, his sugar is noted to be 59- despite this he is mentating well and able to give an accurate history - His last A1c checked on 11/29 was 14.6   CAD S/P CABG x 5 -Continue Lipitor   OSA on CPAP -I will order CPAP     Paroxysmal atrial fibrillation ([place]) -Continue Eliquis-based on his home medication list, he does not appear to be on any rate controlling agents          Discharge Instructions  Discharge Instructions     Diet - low sodium heart healthy   Complete by: As directed    Increase activity slowly   Complete by: As directed       Allergies as of 09/05/2022       Reactions   Cilostazol Swelling, Other (See Comments)   Edema   Dulaglutide Nausea And Vomiting, Other (See Comments)   TRULICITY  Levofloxacin Hives, Itching, Rash   Liraglutide Other (See Comments)   Severe fatigue & insomnia   Lisinopril Itching, Rash, Cough        Medication List     STOP taking these medications    furosemide 20 MG tablet Commonly known as: LASIX       TAKE these medications    acetaminophen 325 MG tablet Commonly known as: TYLENOL Take 325-650 mg by mouth every 6 (six) hours as needed for headache or mild pain.   apixaban 5 MG Tabs tablet Commonly known as: Eliquis TAKE ONE TABLET BY MOUTH EVERY MORNING and TAKE ONE TABLET BY MOUTH EVERY EVENING What changed:  how much to take how to take this when  to take this additional instructions   atorvastatin 80 MG tablet Commonly known as: LIPITOR Take 80 mg by mouth every evening. What changed: Another medication with the same name was changed. Make sure you understand how and when to take each.   atorvastatin 80 MG tablet Commonly known as: LIPITOR Take 1 tablet (80 mg total) by mouth daily. What changed: when to take this   B-D UF III MINI PEN NEEDLES 31G X 5 MM Misc Generic drug: Insulin Pen Needle USE AS DIRECTED WITH LANTUS SOLOSTAR   Basaglar KwikPen 100 UNIT/ML Inject 40 Units into the skin in the morning.   Benadryl Allergy 25 mg capsule Generic drug: diphenhydrAMINE Take 25 mg by mouth every 6 (six) hours as needed for allergies.   DULoxetine 30 MG capsule Commonly known as: CYMBALTA Take 30 mg by mouth every morning. Take along with 60 mg capsule=90 mg   DULoxetine 60 MG capsule Commonly known as: CYMBALTA Take 60 mg by mouth every morning. Take along with 30 mg capsule=90 mg   FreeStyle Libre 2 Sensor Misc Inject 1 Device into the skin every 14 (fourteen) days.   guaiFENesin-codeine 100-10 MG/5ML syrup Commonly known as: ROBITUSSIN AC take 68m BY MOUTH EVERY 4 HOURS AS NEEDED What changed:  how much to take how to take this when to take this reasons to take this additional instructions   insulin lispro 100 UNIT/ML KwikPen Commonly known as: HUMALOG Inject 8 Units into the skin 3 (three) times daily with meals.   MAGNESIUM PO Take 350 mg by mouth every evening.   nitroGLYCERIN 0.4 MG SL tablet Commonly known as: NITROSTAT Place 0.4 mg under the tongue every 5 (five) minutes as needed for chest pain.   nystatin cream Commonly known as: MYCOSTATIN Apply 1 Application topically 2 (two) times daily as needed for dry skin. Apply to the groin in the morning and evening when not using the nystatin powder   nystatin powder Commonly known as: MYCOSTATIN/NYSTOP Apply 1 Application topically 2 (two) times  daily as needed (skin irritation). Apply to the groin in the morning and evening   ondansetron 4 MG disintegrating tablet Commonly known as: ZOFRAN-ODT Take 4 mg by mouth every 8 (eight) hours as needed for nausea or vomiting (dissolve orally).   pantoprazole 40 MG tablet Commonly known as: PROTONIX TAKE 1 TABLET BY MOUTH EVERY DAY What changed: when to take this   SENIOR MULTIVITAMIN PLUS PO Take 1 tablet by mouth daily with breakfast.   zaleplon 5 MG capsule Commonly known as: [name] Take 5 mg by mouth at bedtime.            The results of significant diagnostics from this hospitalization (including imaging, microbiology, ancillary and laboratory) are listed below for reference.  DG Chest Portable 1 View  Result Date: 09/04/2022 CLINICAL DATA:  Weakness, shortness of breath EXAM: PORTABLE CHEST 1 VIEW COMPARISON:  06/17/2022 FINDINGS: Cardiomegaly status post median sternotomy and CABG. Both lungs are clear. The visualized skeletal structures are unremarkable. IMPRESSION: Cardiomegaly without acute abnormality of the lungs in AP portable projection. Electronically Signed   By: Delanna Ahmadi M.D.   On: 09/04/2022 16:11   EEG adult  Result Date: 08/13/2022 Lora Havens, MD     08/13/2022  5:56 PM Patient Name: CAEDYN RAYGOZA MRN: [id number] Epilepsy Attending: Lora Havens Referring Physician/Provider: Lorenza Chick, MD Date: 08/13/2022 Duration: 21.28 mins Patient history: 76 year old male with altered mental status.  EEG to evaluate for seizure. Level of alertness: Awake AEDs during EEG study: None Technical aspects: This EEG study was done with scalp electrodes positioned according to the 10-20 International system of electrode placement. Electrical activity was reviewed with band pass filter of 1-'70Hz'$ , sensitivity of 7 uV/mm, display speed of 49m/sec with a '60Hz'$  notched filter applied as appropriate. EEG data were recorded continuously and digitally stored.  Video  monitoring was available and reviewed as appropriate. Description: The posterior dominant rhythm consists of 8 Hz activity of moderate voltage (25-35 uV) seen predominantly in posterior head regions, symmetric and reactive to eye opening and eye closing. Hyperventilation and photic stimulation were not performed.   IMPRESSION: This study is within normal limits. No seizures or epileptiform discharges were seen throughout the recording. Priyanka OBarbra Sarks  MR LUMBAR SPINE WO CONTRAST  Result Date: 08/13/2022 CLINICAL DATA:  Chronic myelopathy EXAM: MRI LUMBAR SPINE WITHOUT CONTRAST TECHNIQUE: Multiplanar, multisequence MR imaging of the lumbar spine was performed. No intravenous contrast was administered. COMPARISON:  None Available. FINDINGS: Segmentation:  Standard. Alignment:  Physiologic. Vertebrae: Mild wedge compression deformity of L5 with less than 25% height loss. Minimal edema, age indeterminate. Conus medullaris and cauda equina: Conus extends to the L1 level. Conus and cauda equina appear normal. Paraspinal and other soft tissues: Negative Disc levels: T12-L1: Small left subarticular disc protrusion without stenosis. L1-L2: Small left subarticular disc protrusion. No spinal canal stenosis. Moderate left neural foraminal stenosis. L2-L3: Normal disc space and facet joints. No spinal canal stenosis. No neural foraminal stenosis. L3-L4: Left asymmetric disc bulge. Left lateral recess narrowing without central spinal canal stenosis. Mild left neural foraminal stenosis. L4-L5: Left asymmetric disc bulge with severe left facet arthrosis. Mild spinal canal stenosis. No neural foraminal stenosis. L5-S1: Disc space narrowing with facet arthrosis and small disc bulge. No spinal canal stenosis. Mild bilateral neural foraminal stenosis. Visualized sacrum: Normal. IMPRESSION: 1. Mild wedge compression deformity of L5 with less than 25% height loss and minimal edema, age indeterminate. Correlate for history of  trauma. 2. Mild spinal canal stenosis at L4-L5 due to combination of disc bulge and severe left facet arthrosis. 3. Moderate left L1-L2 neural foraminal stenosis. 4. Mild left L3-L4 and bilateral L5-S1 neural foraminal stenosis. Electronically Signed   By: KUlyses JarredM.D.   On: 08/13/2022 00:05   MR CERVICAL SPINE WO CONTRAST  Result Date: 08/12/2022 CLINICAL DATA:  Myelopathy EXAM: MRI CERVICAL SPINE WITHOUT CONTRAST TECHNIQUE: Multiplanar, multisequence MR imaging of the cervical spine was performed. No intravenous contrast was administered. COMPARISON:  None Available. FINDINGS: Alignment: Physiologic. Vertebrae: No fracture, evidence of discitis, or bone lesion. Cord: Normal signal and morphology. Posterior Fossa, vertebral arteries, paraspinal tissues: Negative. Disc levels: C1-2: Unremarkable. C2-3: Mild facet hypertrophy. No disc herniation. There is no spinal  canal stenosis. No neural foraminal stenosis. C3-4: Small disc bulge with left uncovertebral spurring. Mild spinal canal stenosis. Mild left neural foraminal stenosis. C4-5: Small disc bulge with uncovertebral spurring, unchanged. There is no spinal canal stenosis. No neural foraminal stenosis. C5-6: Minimal disc bulge, unchanged. There is no spinal canal stenosis. No neural foraminal stenosis. C6-7: Normal disc space and facet joints. There is no spinal canal stenosis. No neural foraminal stenosis. C7-T1: Normal disc space and facet joints. There is no spinal canal stenosis. No neural foraminal stenosis. IMPRESSION: 1. No acute abnormality of the cervical spine. 2. Mild spinal canal stenosis and left neural foraminal stenosis at C3-4. Electronically Signed   By: Ulyses Jarred M.D.   On: 08/12/2022 23:51   MR BRAIN WO CONTRAST  Result Date: 08/12/2022 CLINICAL DATA:  Fall.  History of myasthenia gravis. EXAM: MRI HEAD WITHOUT CONTRAST TECHNIQUE: Multiplanar, multiecho pulse sequences of the brain and surrounding structures were obtained  without intravenous contrast. COMPARISON:  09/01/2013 FINDINGS: Brain: No acute infarct, mass effect or extra-axial collection. No acute or chronic hemorrhage. There is multifocal hyperintense T2-weighted signal within the white matter. Generalized volume loss. The midline structures are normal. Vascular: Major flow voids are preserved. Skull and upper cervical spine: Normal calvarium and skull base. Visualized upper cervical spine and soft tissues are normal. Sinuses/Orbits:No paranasal sinus fluid levels or advanced mucosal thickening. No mastoid or middle ear effusion. Normal orbits. IMPRESSION: 1. No acute intracranial abnormality. 2. Findings of chronic small vessel ischemia and volume loss. Electronically Signed   By: Ulyses Jarred M.D.   On: 08/12/2022 23:44   CT Head Wo Contrast  Result Date: 08/12/2022 CLINICAL DATA:  Trauma EXAM: CT HEAD WITHOUT CONTRAST TECHNIQUE: Contiguous axial images were obtained from the base of the skull through the vertex without intravenous contrast. RADIATION DOSE REDUCTION: This exam was performed according to the departmental dose-optimization program which includes automated exposure control, adjustment of the mA and/or kV according to patient size and/or use of iterative reconstruction technique. COMPARISON:  06/17/22 CT Brain FINDINGS: Brain: No evidence of acute infarction, hemorrhage, hydrocephalus, extra-axial collection or mass lesion/mass effect. Sequela mild chronic microvascular ischemic change. Mineralization of the basal ganglia. Vascular: No hyperdense vessel or unexpected calcification. Vascular calcification of the carotid siphons bilaterally. Skull: Normal. Negative for fracture or focal lesion. Sinuses/Orbits: Mucosal thickening right maxillary sinus. Bilateral lens replacement. Other: None. IMPRESSION: No evidence of intracranial injury. Electronically Signed   By: Marin Roberts M.D.   On: 08/12/2022 12:06   Labs:   Basic Metabolic Panel: Recent Labs   Lab 09/04/22 1500 09/05/22 0818  NA 143 140  K 4.3 5.3*  CL 111 110  CO2 25 24  GLUCOSE 159* 139*  BUN 28* 29*  CREATININE 2.24* 2.00*  CALCIUM 8.5* 8.1*     CBC: Recent Labs  Lab 09/04/22 1500  WBC 9.2  HGB 13.9  HCT 44.0  MCV 93.8  PLT 279         SIGNED:   Debbe Odea, MD  [facility] 09/05/2022, 2:32 PM

## 2022-09-05 NOTE — Progress Notes (Signed)
Pt. Discharged via wheelchair accompanied by daughter. Pt. Is alert and oriented, no distress. TED hose on. Discharge instruction and education discussed, patient verbalized understanding. All patient belongings are with the family.

## 2022-09-05 NOTE — TOC Initial Note (Signed)
Transition of Care [facility]) - Initial/Assessment Note    Patient Details  Name: Fernando Hess MRN: [id number] Date of Birth: 30-Jun-1946  Transition of Care [facility]) CM/SW Contact:    Vassie Moselle, [place] Phone Number: 09/05/2022, 11:29 AM  Clinical Narrative:                 Pt is to return home with home health services through [facility]. Pt has been receiving HH PT/OT/Aide/Speech therapy through [facility] following prior admission in November. Pt reports having RW, wheelchair, rollator, BSC, and crutches at home. Pt/family currently trying to get hospital bed for pt and have contacted pt's PCP for DME order however, order has not been placed yet. CSW encouraged pt to follow up with PCP regarding order.    Expected Discharge Plan: [place] Barriers to Discharge: No Barriers Identified   Patient Goals and CMS Choice Patient states their goals for this hospitalization and ongoing recovery are:: To go home CMS Medicare.gov Compare Post Acute Care list provided to:: Patient Choice offered to / list presented to : Patient [place] ownership interest in [facility].provided to::  (NA)    Expected Discharge Plan and Services In-house Referral: NA Discharge Planning Services: CM Consult Post Acute Care Choice: Home Health, Resumption of Svcs/PTA Provider Living arrangements for the past 2 months: Single Family Home Expected Discharge Date: 09/05/22               DME Arranged: N/A DME Agency: NA       HH Arranged: PT, OT, Nurse's Aide, Speech Therapy HH Agency: [place] ([facility]) Date [place]: 09/05/22 Time [place]: 1128 Representative spoke with at [place]: [place] Arrangements/Services Living arrangements for the past 2 months: [place] with:: Spouse Patient language and need for interpreter reviewed:: Yes        Need for Family Participation in Patient Care: No (Comment) Care  giver support system in place?: Yes (comment) Current home services: DME (RW, WC, crutches, rollator) Criminal Activity/Legal Involvement Pertinent to Current Situation/Hospitalization: No - Comment as needed  Activities of Daily Living Home Assistive Devices/Equipment: Walker (specify type), Grab bars in shower, Grab bars around toilet, Eyeglasses (four wheel, diabetic implant for CBG monitoring) ADL Screening (condition at time of admission) Patient's cognitive ability adequate to safely complete daily activities?: Yes Is the patient deaf or have difficulty hearing?: No Does the patient have difficulty seeing, even when wearing glasses/contacts?: No Does the patient have difficulty concentrating, remembering, or making decisions?: No Patient able to express need for assistance with ADLs?: Yes Does the patient have difficulty dressing or bathing?: No Independently performs ADLs?: Yes (appropriate for developmental age) Does the patient have difficulty walking or climbing stairs?: Yes Weakness of Legs: Both Weakness of Arms/Hands: None  Permission Sought/Granted                  Emotional Assessment Appearance:: Appears stated age Attitude/Demeanor/Rapport: Engaged Affect (typically observed): Accepting Orientation: : Oriented to Self, Oriented to Place, Oriented to  Time, Oriented to Situation Alcohol / Substance Use: Not Applicable Psych Involvement: No (comment)  Admission diagnosis:  Weakness [R53.1] AKI (acute kidney injury) ([place]) [N17.9] Patient Active Problem List   Diagnosis Date Noted   AKI (acute kidney injury) ([place]) 09/04/2022   Generalized weakness 08/12/2022   Myasthenia gravis ([place]) 04/21/2022   Chronic kidney disease, stage 3b ([place]) 03/21/2022   Posterior vitreous detachment of both eyes  01/23/2022   Constipation 11/13/2021   Type 2 diabetes mellitus with moderate nonproliferative diabetic retinopathy with macular edema, bilateral ([place]) 11/13/2021   Severe  major depression, single episode, without psychotic features ([place]) 11/13/2021   Amaurosis fugax 11/13/2021   Amnesia 11/13/2021   Anxiety 11/13/2021   Cardiac arrhythmia 11/13/2021   Hearing loss 11/13/2021   Hypercoagulable state ([place]) 11/13/2021   Hyperglycemia due to type 2 diabetes mellitus ([place]) 11/13/2021   Hyperparathyroidism due to renal insufficiency ([place]) 11/13/2021   Mild aortic stenosis 11/05/2021   Paroxysmal atrial fibrillation ([place]) 11/05/2021   Nasal septal deviation 08/30/2021   Anticoagulated by anticoagulation treatment 07/16/2021   Syncope 05/10/2021   Gastro-esophageal reflux disease without esophagitis 07/25/2020   Globus pharyngeus 07/25/2020   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 ([place]) 12/21/2019   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   Chronic heart failure with preserved ejection fraction (HFpEF) ([place]) 11/18/2019   OSA on CPAP 03/07/2019   Peripheral artery disease    Intractable vascular headache 11/11/2018   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   Coronary artery disease involving native coronary artery of native heart without angina pectoris 09/24/2015   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 [place] [address] Phone: [phone] Fax: [phone]     Social Determinants of Health (SDOH) Social History: SDOH Screenings   Depression (PHQ2-9): Low Risk  (05/20/2019)  Tobacco Use: Low Risk  (09/04/2022)   SDOH Interventions:     Readmission Risk Interventions     No data to display

## 2022-09-05 NOTE — Evaluation (Signed)
Physical Therapy Evaluation Patient Details Name: Fernando Hess MRN: [id number] DOB: 08-30-1946 Today's Date: 09/05/2022  History of Present Illness  Fernando Hess is a 76 y.o. male with medical history of atrial fibrillation, coronary artery disease, chronic diastolic heart failure diabetes mellitus and obstructive sleep apnea and new diagnosis of myasthenia gravis.  He was admitted with weakness, back pain and hypotension.  Clinical Impression  Patient presents with mobility close to recent baseline, but with positive orthostatics.  He reports wife recently discharged from [facility] and he helps her at home.  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  Educated in fall prevention and sitting when feeling lightheaded.  Does have a shower chair and already active with HHPT.  Patient still able to ambulate and negotiate steps with some supervision/minguard A today without report of symptoms.  Patient also reports he is looking into getting into ALF with wife but may need some assistance with this.  PT will follow up if not d/c.      Recommendations for follow up therapy are one component of a multi-disciplinary discharge planning process, led by the attending physician.  Recommendations may be updated based on patient status, additional functional criteria and insurance authorization.  Follow Up Recommendations Home health PT      Assistance Recommended at Discharge Intermittent Supervision/Assistance  Patient can return home with the following  Help with stairs or ramp for entrance;Assistance with cooking/housework    Equipment Recommendations None recommended by PT  Recommendations for Other Services       Functional Status Assessment Patient has had a recent decline in their functional status and demonstrates the ability to make significant improvements in function in a reasonable and predictable amount of time.      Precautions / Restrictions Precautions Precautions: Fall Precaution Comments: two falls over past 3 weeks since d/c Restrictions Weight Bearing Restrictions: No      Mobility  Bed Mobility               General bed mobility comments: sittin up on EOB    Transfers Overall transfer level: Modified independent Equipment used: Rolling walker (2 wheels)               General transfer comment: up x 3 from EOB no assist    Ambulation/Gait Ambulation/Gait assistance: Supervision Gait Distance (Feet): 120 Feet Assistive device: Rolling walker (2 wheels) Gait Pattern/deviations: Step-through pattern, Wide base of support, Decreased stride length       General Gait Details: Used RW and cues for rolling as picking up initially, stable despite picking up RW  Stairs Stairs: Yes Stairs assistance: Min guard, Supervision Stair Management: One rail Left, Two rails, Step to pattern, Alternating pattern, Forwards Number of Stairs: 3 General stair comments: using L rail to ascend with step through technique, step to with both rails to descend and minguard for safety with IV  Wheelchair Mobility    Modified Rankin (Stroke Patients Only)       Balance Overall balance assessment: Needs assistance   Sitting balance-Leahy Scale: Good     Standing balance support: No upper extremity supported Standing balance-Leahy Scale: Fair Standing balance comment: picking up RW without LOB, standing for orthostatics no UE support                             Pertinent Vitals/Pain Pain Assessment Pain Assessment:  0-10 Pain Score: 2  Pain Location: back Pain Descriptors / Indicators: Aching Pain Intervention(s): Monitored during session, Repositioned    Home Living Family/patient expects to be discharged to:: Private residence Living Arrangements: Spouse/significant other;Children Available Help at Discharge: Family;Available 24 hours/day Type of Home: House Home  Access: Stairs to enter Entrance Stairs-Rails: Left Entrance Stairs-Number of Steps: 5   Home Layout: Two level;Able to live on main level with bedroom/bathroom Home Equipment: Rollator (4 wheels);Shower seat;Grab bars - tub/shower;BSC/3in1      Prior Function Prior Level of Function : Needs assist             Mobility Comments: uses rollator at home, frequent falls at baseline has fallen a "couple" times since last admission 3 weeks ago without injury, just soreness ADLs Comments: states has been helping his wife     Hand Dominance   Dominant Hand: Right    Extremity/Trunk Assessment   Upper Extremity Assessment Upper Extremity Assessment: Overall WFL for tasks assessed    Lower Extremity Assessment RLE Deficits / Details: AROM WFL, strength 4+/5 LLE Deficits / Details: AROM WFL, strength 4+/5    Cervical / Trunk Assessment Cervical / Trunk Assessment: Kyphotic  Communication   Communication: No difficulties  Cognition Arousal/Alertness: Awake/alert Behavior During Therapy: WFL for tasks assessed/performed Overall Cognitive Status: Within Functional Limits for tasks assessed                                          General Comments General comments (skin integrity, edema, etc.): orthostatic BP positive, see flowsheet    Exercises     Assessment/Plan    PT Assessment Patient needs continued PT services  PT Problem List Decreased strength;Decreased mobility;Decreased balance;Decreased knowledge of use of DME       PT Treatment Interventions DME instruction;Balance training;Gait training;Stair training;Functional mobility training;Patient/family education;Therapeutic activities;Therapeutic exercise    PT Goals (Current goals can be found in the Care Plan section)  Acute Rehab PT Goals Patient Stated Goal: trying to return home today PT Goal Formulation: With patient Time For Goal Achievement: 09/12/22 Potential to Achieve Goals: Good     Frequency Min 3X/week     Co-evaluation               AM-PAC PT "6 Clicks" Mobility  Outcome Measure Help needed turning from your back to your side while in a flat bed without using bedrails?: None Help needed moving from lying on your back to sitting on the side of a flat bed without using bedrails?: None Help needed moving to and from a bed to a chair (including a wheelchair)?: None Help needed standing up from a chair using your arms (e.g., wheelchair or bedside chair)?: None Help needed to walk in hospital room?: A Little Help needed climbing 3-5 steps with a railing? : A Little 6 Click Score: 22    End of Session Equipment Utilized During Treatment: Gait belt Activity Tolerance: Patient tolerated treatment well Patient left: in bed   PT Visit Diagnosis: History of falling (Z91.81)    Time: 9211-9417 PT Time Calculation (min) (ACUTE ONLY): 31 min   Charges:   PT Evaluation $PT Eval Moderate Complexity: 1 Mod PT Treatments $Gait Training: 8-22 mins        Magda Kiel, PT Acute Rehabilitation Services Office:[phone] 09/05/2022   Reginia Naas 09/05/2022, 10:24 AM

## 2022-09-16 ENCOUNTER — Other Ambulatory Visit: Payer: Self-pay

## 2022-09-16 DIAGNOSIS — I480 Paroxysmal atrial fibrillation: Secondary | ICD-10-CM

## 2022-09-16 MED ORDER — APIXABAN 5 MG PO TABS: 5.0000 mg | ORAL_TABLET | Freq: Two times a day (BID) | ORAL | 3 refills | Status: DC

## 2022-09-16 NOTE — Telephone Encounter (Signed)
Prescription refill request for Eliquis received. Indication: Afib  Last office visit: 07/17/22 Johney Frame)  Scr: 2.00 (09/05/22)  Age: 77 Weight: 79.4kg  Appropriate dose and refill sent to requested pharmacy.

## 2022-09-17 ENCOUNTER — Emergency Department (HOSPITAL_COMMUNITY): Payer: Medicare Other

## 2022-09-17 ENCOUNTER — Inpatient Hospital Stay (HOSPITAL_COMMUNITY)
Admission: EM | Admit: 2022-09-17 | Discharge: 2022-09-24 | DRG: 071 | Disposition: A | Discharge status: Skilled Nursing Facility | Payer: Medicare Other | Attending: Internal Medicine | Admitting: Internal Medicine

## 2022-09-17 ENCOUNTER — Observation Stay (HOSPITAL_COMMUNITY): Payer: Medicare Other

## 2022-09-17 ENCOUNTER — Other Ambulatory Visit: Payer: Self-pay

## 2022-09-17 ENCOUNTER — Encounter (HOSPITAL_COMMUNITY): Payer: Self-pay

## 2022-09-17 DIAGNOSIS — K219 Gastro-esophageal reflux disease without esophagitis: Secondary | ICD-10-CM | POA: Diagnosis present

## 2022-09-17 DIAGNOSIS — G9341 Metabolic encephalopathy: Secondary | ICD-10-CM | POA: Diagnosis not present

## 2022-09-17 DIAGNOSIS — Z9842 Cataract extraction status, left eye: Secondary | ICD-10-CM

## 2022-09-17 DIAGNOSIS — R4701 Aphasia: Secondary | ICD-10-CM | POA: Diagnosis present

## 2022-09-17 DIAGNOSIS — E113392 Type 2 diabetes mellitus with moderate nonproliferative diabetic retinopathy without macular edema, left eye: Secondary | ICD-10-CM | POA: Diagnosis present

## 2022-09-17 DIAGNOSIS — I639 Cerebral infarction, unspecified: Secondary | ICD-10-CM

## 2022-09-17 DIAGNOSIS — F419 Anxiety disorder, unspecified: Secondary | ICD-10-CM | POA: Diagnosis present

## 2022-09-17 DIAGNOSIS — E119 Type 2 diabetes mellitus without complications: Secondary | ICD-10-CM

## 2022-09-17 DIAGNOSIS — G7000 Myasthenia gravis without (acute) exacerbation: Secondary | ICD-10-CM | POA: Diagnosis present

## 2022-09-17 DIAGNOSIS — I252 Old myocardial infarction: Secondary | ICD-10-CM

## 2022-09-17 DIAGNOSIS — G9340 Encephalopathy, unspecified: Secondary | ICD-10-CM | POA: Diagnosis not present

## 2022-09-17 DIAGNOSIS — Z7901 Long term (current) use of anticoagulants: Secondary | ICD-10-CM

## 2022-09-17 DIAGNOSIS — Z1152 Encounter for screening for COVID-19: Secondary | ICD-10-CM

## 2022-09-17 DIAGNOSIS — I130 Hypertensive heart and chronic kidney disease with heart failure and stage 1 through stage 4 chronic kidney disease, or unspecified chronic kidney disease: Secondary | ICD-10-CM | POA: Diagnosis present

## 2022-09-17 DIAGNOSIS — I2510 Atherosclerotic heart disease of native coronary artery without angina pectoris: Secondary | ICD-10-CM | POA: Diagnosis present

## 2022-09-17 DIAGNOSIS — Z9852 Vasectomy status: Secondary | ICD-10-CM

## 2022-09-17 DIAGNOSIS — Z961 Presence of intraocular lens: Secondary | ICD-10-CM | POA: Diagnosis present

## 2022-09-17 DIAGNOSIS — Z951 Presence of aortocoronary bypass graft: Secondary | ICD-10-CM

## 2022-09-17 DIAGNOSIS — Z833 Family history of diabetes mellitus: Secondary | ICD-10-CM

## 2022-09-17 DIAGNOSIS — E1151 Type 2 diabetes mellitus with diabetic peripheral angiopathy without gangrene: Secondary | ICD-10-CM | POA: Diagnosis present

## 2022-09-17 DIAGNOSIS — Z781 Physical restraint status: Secondary | ICD-10-CM

## 2022-09-17 DIAGNOSIS — Z794 Long term (current) use of insulin: Secondary | ICD-10-CM

## 2022-09-17 DIAGNOSIS — I739 Peripheral vascular disease, unspecified: Secondary | ICD-10-CM | POA: Diagnosis present

## 2022-09-17 DIAGNOSIS — I5032 Chronic diastolic (congestive) heart failure: Secondary | ICD-10-CM | POA: Diagnosis present

## 2022-09-17 DIAGNOSIS — I480 Paroxysmal atrial fibrillation: Secondary | ICD-10-CM | POA: Diagnosis present

## 2022-09-17 DIAGNOSIS — Z87442 Personal history of urinary calculi: Secondary | ICD-10-CM

## 2022-09-17 DIAGNOSIS — R197 Diarrhea, unspecified: Secondary | ICD-10-CM | POA: Diagnosis present

## 2022-09-17 DIAGNOSIS — E1122 Type 2 diabetes mellitus with diabetic chronic kidney disease: Secondary | ICD-10-CM | POA: Diagnosis present

## 2022-09-17 DIAGNOSIS — Z9841 Cataract extraction status, right eye: Secondary | ICD-10-CM

## 2022-09-17 DIAGNOSIS — N1832 Chronic kidney disease, stage 3b: Secondary | ICD-10-CM | POA: Diagnosis present

## 2022-09-17 DIAGNOSIS — E785 Hyperlipidemia, unspecified: Secondary | ICD-10-CM | POA: Diagnosis present

## 2022-09-17 DIAGNOSIS — Z8249 Family history of ischemic heart disease and other diseases of the circulatory system: Secondary | ICD-10-CM

## 2022-09-17 DIAGNOSIS — R29703 NIHSS score 3: Secondary | ICD-10-CM | POA: Diagnosis present

## 2022-09-17 DIAGNOSIS — I25810 Atherosclerosis of coronary artery bypass graft(s) without angina pectoris: Secondary | ICD-10-CM | POA: Diagnosis present

## 2022-09-17 DIAGNOSIS — I10 Essential (primary) hypertension: Secondary | ICD-10-CM | POA: Diagnosis present

## 2022-09-17 DIAGNOSIS — R2681 Unsteadiness on feet: Secondary | ICD-10-CM | POA: Diagnosis present

## 2022-09-17 DIAGNOSIS — F32A Depression, unspecified: Secondary | ICD-10-CM | POA: Diagnosis present

## 2022-09-17 DIAGNOSIS — Z881 Allergy status to other antibiotic agents status: Secondary | ICD-10-CM

## 2022-09-17 DIAGNOSIS — I5030 Unspecified diastolic (congestive) heart failure: Secondary | ICD-10-CM | POA: Diagnosis present

## 2022-09-17 DIAGNOSIS — Z9049 Acquired absence of other specified parts of digestive tract: Secondary | ICD-10-CM

## 2022-09-17 DIAGNOSIS — E1169 Type 2 diabetes mellitus with other specified complication: Secondary | ICD-10-CM

## 2022-09-17 DIAGNOSIS — F0394 Unspecified dementia, unspecified severity, with anxiety: Secondary | ICD-10-CM | POA: Diagnosis present

## 2022-09-17 DIAGNOSIS — E1165 Type 2 diabetes mellitus with hyperglycemia: Secondary | ICD-10-CM | POA: Diagnosis present

## 2022-09-17 DIAGNOSIS — R4182 Altered mental status, unspecified: Secondary | ICD-10-CM

## 2022-09-17 DIAGNOSIS — G4733 Obstructive sleep apnea (adult) (pediatric): Secondary | ICD-10-CM | POA: Diagnosis present

## 2022-09-17 DIAGNOSIS — Z888 Allergy status to other drugs, medicaments and biological substances status: Secondary | ICD-10-CM

## 2022-09-17 DIAGNOSIS — Z79899 Other long term (current) drug therapy: Secondary | ICD-10-CM

## 2022-09-17 LAB — COMPREHENSIVE METABOLIC PANEL
ALT: 29 U/L (ref 0–44)
AST: 35 U/L (ref 15–41)
Albumin: 3 g/dL — ABNORMAL LOW (ref 3.5–5.0)
Alkaline Phosphatase: 131 U/L — ABNORMAL HIGH (ref 38–126)
Anion gap: 17 — ABNORMAL HIGH (ref 5–15)
BUN: 20 mg/dL (ref 8–23)
CO2: 21 mmol/L — ABNORMAL LOW (ref 22–32)
Calcium: 9.1 mg/dL (ref 8.9–10.3)
Chloride: 106 mmol/L (ref 98–111)
Creatinine, Ser: 1.99 mg/dL — ABNORMAL HIGH (ref 0.61–1.24)
GFR, Estimated: 34 mL/min — ABNORMAL LOW (ref >60)
Glucose, Bld: 78 mg/dL (ref 70–99)
Potassium: 4 mmol/L (ref 3.5–5.1)
Sodium: 144 mmol/L (ref 135–145)
Total Bilirubin: 1.8 mg/dL — ABNORMAL HIGH (ref 0.3–1.2)
Total Protein: 5.8 g/dL — ABNORMAL LOW (ref 6.5–8.1)

## 2022-09-17 LAB — CBC
HCT: 43.3 % (ref 39.0–52.0)
Hemoglobin: 14.2 g/dL (ref 13.0–17.0)
MCH: 30.6 pg (ref 26.0–34.0)
MCHC: 32.8 g/dL (ref 30.0–36.0)
MCV: 93.3 fL (ref 80.0–100.0)
Platelets: 251 10*3/uL (ref 150–400)
RBC: 4.64 MIL/uL (ref 4.22–5.81)
RDW: 17.3 % — ABNORMAL HIGH (ref 11.5–15.5)
WBC: 13.6 10*3/uL — ABNORMAL HIGH (ref 4.0–10.5)
nRBC: 0 % (ref 0.0–0.2)

## 2022-09-17 LAB — DIFFERENTIAL
Abs Immature Granulocytes: 0.12 10*3/uL — ABNORMAL HIGH (ref 0.00–0.07)
Basophils Absolute: 0.1 10*3/uL (ref 0.0–0.1)
Basophils Relative: 1 %
Eosinophils Absolute: 0.1 10*3/uL (ref 0.0–0.5)
Eosinophils Relative: 1 %
Immature Granulocytes: 1 %
Lymphocytes Relative: 22 %
Lymphs Abs: 3 10*3/uL (ref 0.7–4.0)
Monocytes Absolute: 1.2 10*3/uL — ABNORMAL HIGH (ref 0.1–1.0)
Monocytes Relative: 9 %
Neutro Abs: 9 10*3/uL — ABNORMAL HIGH (ref 1.7–7.7)
Neutrophils Relative %: 66 %

## 2022-09-17 LAB — RESP PANEL BY RT-PCR (RSV, FLU A&B, COVID)  RVPGX2
Influenza A by PCR: NEGATIVE
Influenza B by PCR: NEGATIVE
Resp Syncytial Virus by PCR: NEGATIVE
SARS Coronavirus 2 by RT PCR: NEGATIVE

## 2022-09-17 LAB — URINALYSIS, ROUTINE W REFLEX MICROSCOPIC
Bacteria, UA: NONE SEEN
Bilirubin Urine: NEGATIVE
Glucose, UA: NEGATIVE mg/dL
Hgb urine dipstick: NEGATIVE
Ketones, ur: 5 mg/dL — AB
Leukocytes,Ua: NEGATIVE
Nitrite: NEGATIVE
Protein, ur: >=300 mg/dL — AB
Specific Gravity, Urine: >1.046 — ABNORMAL HIGH (ref 1.005–1.030)
pH: 5 (ref 5.0–8.0)

## 2022-09-17 LAB — RAPID URINE DRUG SCREEN, HOSP PERFORMED
Amphetamines: NOT DETECTED
Barbiturates: NOT DETECTED
Benzodiazepines: NOT DETECTED
Cocaine: NOT DETECTED
Opiates: NOT DETECTED
Tetrahydrocannabinol: NOT DETECTED

## 2022-09-17 LAB — CBG MONITORING, ED
Glucose-Capillary: 80 mg/dL (ref 70–99)
Glucose-Capillary: 110 mg/dL — ABNORMAL HIGH (ref 70–99)

## 2022-09-17 LAB — I-STAT CHEM 8, ED
BUN: 19 mg/dL (ref 8–23)
Calcium, Ion: 1.08 mmol/L — ABNORMAL LOW (ref 1.15–1.40)
Chloride: 107 mmol/L (ref 98–111)
Creatinine, Ser: 1.9 mg/dL — ABNORMAL HIGH (ref 0.61–1.24)
Glucose, Bld: 79 mg/dL (ref 70–99)
HCT: 42 % (ref 39.0–52.0)
Hemoglobin: 14.3 g/dL (ref 13.0–17.0)
Potassium: 4 mmol/L (ref 3.5–5.1)
Sodium: 143 mmol/L (ref 135–145)
TCO2: 22 mmol/L (ref 22–32)

## 2022-09-17 LAB — TSH: TSH: 0.58 u[IU]/mL (ref 0.350–4.500)

## 2022-09-17 LAB — ETHANOL: Alcohol, Ethyl (B): <10 mg/dL (ref <10)

## 2022-09-17 LAB — PROTIME-INR
INR: 1.5 — ABNORMAL HIGH (ref 0.8–1.2)
Prothrombin Time: 18.2 seconds — ABNORMAL HIGH (ref 11.4–15.2)

## 2022-09-17 LAB — AMMONIA: Ammonia: 14 umol/L (ref 9–35)

## 2022-09-17 LAB — APTT: aPTT: 31 seconds (ref 24–36)

## 2022-09-17 MED ORDER — IOHEXOL 350 MG/ML SOLN
75.0000 mL | Freq: Once | INTRAVENOUS | Status: AC | PRN
  Administered 2022-09-17: 75 mL via INTRAVENOUS

## 2022-09-17 MED ORDER — ACETAMINOPHEN 650 MG RE SUPP: 650.0000 mg | Freq: Four times a day (QID) | RECTAL | Status: DC | PRN

## 2022-09-17 MED ORDER — POLYETHYLENE GLYCOL 3350 17 G PO PACK: 17.0000 g | PACK | Freq: Every day | ORAL | Status: DC | PRN

## 2022-09-17 MED ORDER — LORAZEPAM 2 MG/ML IJ SOLN
INTRAMUSCULAR | Status: AC
  Filled 2022-09-17: qty 1

## 2022-09-17 MED ORDER — APIXABAN 5 MG PO TABS
5.0000 mg | ORAL_TABLET | Freq: Two times a day (BID) | ORAL | Status: DC
  Administered 2022-09-17 – 2022-09-24 (×14): 5 mg via ORAL
  Filled 2022-09-17 (×14): qty 1

## 2022-09-17 MED ORDER — ATORVASTATIN CALCIUM 80 MG PO TABS
80.0000 mg | ORAL_TABLET | Freq: Every evening | ORAL | Status: DC
  Administered 2022-09-17 – 2022-09-23 (×7): 80 mg via ORAL
  Filled 2022-09-17 (×3): qty 1
  Filled 2022-09-17: qty 2
  Filled 2022-09-17 (×3): qty 1

## 2022-09-17 MED ORDER — MIDAZOLAM HCL 2 MG/2ML IJ SOLN
INTRAMUSCULAR | Status: AC
  Filled 2022-09-17: qty 2

## 2022-09-17 MED ORDER — INSULIN ASPART 100 UNIT/ML IJ SOLN: 0.0000 [IU] | Freq: Every day | INTRAMUSCULAR | Status: DC

## 2022-09-17 MED ORDER — STROKE: EARLY STAGES OF RECOVERY BOOK: Freq: Once | Status: AC

## 2022-09-17 MED ORDER — SODIUM CHLORIDE 0.9% FLUSH
3.0000 mL | Freq: Two times a day (BID) | INTRAVENOUS | Status: DC
  Administered 2022-09-17 – 2022-09-24 (×15): 3 mL via INTRAVENOUS

## 2022-09-17 MED ORDER — GADOBUTROL 1 MMOL/ML IV SOLN
8.0000 mL | Freq: Once | INTRAVENOUS | Status: AC | PRN
  Administered 2022-09-17: 8 mL via INTRAVENOUS

## 2022-09-17 MED ORDER — ACETAMINOPHEN 325 MG PO TABS
650.0000 mg | ORAL_TABLET | Freq: Four times a day (QID) | ORAL | Status: DC | PRN
  Administered 2022-09-21 – 2022-09-23 (×3): 650 mg via ORAL
  Filled 2022-09-17 (×3): qty 2

## 2022-09-17 MED ORDER — ASPIRIN 81 MG PO TBEC
81.0000 mg | DELAYED_RELEASE_TABLET | Freq: Every day | ORAL | Status: DC
  Administered 2022-09-17 – 2022-09-18 (×2): 81 mg via ORAL
  Filled 2022-09-17 (×2): qty 1

## 2022-09-17 MED ORDER — ACETAMINOPHEN 325 MG PO TABS
650.0000 mg | ORAL_TABLET | Freq: Once | ORAL | Status: AC
  Administered 2022-09-17: 650 mg via ORAL
  Filled 2022-09-17: qty 2

## 2022-09-17 MED ORDER — INSULIN GLARGINE-YFGN 100 UNIT/ML ~~LOC~~ SOLN
20.0000 [IU] | Freq: Every day | SUBCUTANEOUS | Status: DC
  Administered 2022-09-18 – 2022-09-21 (×4): 20 [IU] via SUBCUTANEOUS
  Filled 2022-09-17 (×4): qty 0.2

## 2022-09-17 MED ORDER — SODIUM CHLORIDE 0.9 % IV BOLUS
1000.0000 mL | Freq: Once | INTRAVENOUS | Status: AC
  Administered 2022-09-17: 1000 mL via INTRAVENOUS

## 2022-09-17 MED ORDER — PANTOPRAZOLE SODIUM 40 MG PO TBEC
40.0000 mg | DELAYED_RELEASE_TABLET | Freq: Every morning | ORAL | Status: DC
  Administered 2022-09-18 – 2022-09-24 (×7): 40 mg via ORAL
  Filled 2022-09-17 (×7): qty 1

## 2022-09-17 MED ORDER — INSULIN ASPART 100 UNIT/ML IJ SOLN
0.0000 [IU] | Freq: Three times a day (TID) | INTRAMUSCULAR | Status: DC
  Administered 2022-09-18: 8 [IU] via SUBCUTANEOUS
  Administered 2022-09-18: 3 [IU] via SUBCUTANEOUS
  Administered 2022-09-19 (×2): 5 [IU] via SUBCUTANEOUS
  Administered 2022-09-19: 8 [IU] via SUBCUTANEOUS
  Administered 2022-09-20: 3 [IU] via SUBCUTANEOUS
  Administered 2022-09-20: 5 [IU] via SUBCUTANEOUS
  Administered 2022-09-20: 2 [IU] via SUBCUTANEOUS
  Administered 2022-09-21: 3 [IU] via SUBCUTANEOUS
  Administered 2022-09-21: 8 [IU] via SUBCUTANEOUS
  Administered 2022-09-21: 5 [IU] via SUBCUTANEOUS
  Administered 2022-09-22: 3 [IU] via SUBCUTANEOUS
  Administered 2022-09-22: 8 [IU] via SUBCUTANEOUS
  Administered 2022-09-22: 5 [IU] via SUBCUTANEOUS
  Administered 2022-09-23: 3 [IU] via SUBCUTANEOUS
  Administered 2022-09-23 (×2): 5 [IU] via SUBCUTANEOUS
  Administered 2022-09-24: 3 [IU] via SUBCUTANEOUS
  Administered 2022-09-24: 5 [IU] via SUBCUTANEOUS
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
## 2022-09-17 NOTE — H&P (Signed)
History and Physical   Fernando Hess HER:[id number] DOB: 05-08-1946 DOA: 09/17/2022  PCP: [name], MD   Patient coming from: Home  Chief Complaint: Altered mental status  HPI: Fernando Hess is a 77 y.o. 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  History obtained with assistance of chart review and family.  Patient arrived as a code stroke.  Wife noted that he had a slow and unsteady gait going back from the doctor earlier today.  I would like at home he is trying to get the mail but cannot figure out how to open the mailbox.  He then could not figure out how to shift his car to drive up the driveway.  Called EMS who did not he was reportedly nonverbal with involuntary movement for them initially.  He was transported as a code stroke.  Wife reported decreased p.o. intake recently, at least in part due to not wanting his blood sugar to be too high and his doctor visit. Wife states she is unwell as well and is unable to care for him if he needs significant help.  He currently is more alert and oriented, denies fevers, chills, chest pain, shortness breath, abdominal pain, constipation, diarrhea, nausea, vomiting  ED Course: Vital signs in the ED significant for blood pressure in the 856D to 149F systolic.  Lab workup included CMP with bicarb 21, creatinine 1.99 which is near baseline, protein 5.8,-3.0, alk phos 131, T. bili 1.8.  CBC with leukocytosis to 13.6.  PT and INR mildly elevated at 18 and 1.5 respectively.  TSH normal.  Respiratory panel for flu COVID and RSV pending.  UDS, urinalysis pending.  Ethanol level negative.  Ammonia level pending.  CTh no acute abnormality.  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  MR brain pending.  Patient received Tylenol, liter fluids in the ED as well.  Received a dose of Ativan around the time he had his MRI with some improvement in symptoms.  Neurology saw the patient on arrival and recommending workup for stroke versus seizure pending MRI results.  Review of Systems: As per HPI otherwise all other systems reviewed and are negative.  Past Medical History:  Diagnosis Date   Acute renal failure (ARF) ([place]) 09/24/2015   Acute stress disorder 11/13/2021   Atrial premature complexes    CAD (coronary artery disease) of artery bypass graft    Early occlusion of saphenous vein graft to intermediate and marginal branch in February 2007 following bypass grafting    CAD (coronary artery disease), native coronary artery 2017   hx NSTEMI 09-24-2015  s/p  CABG x5 on 10-02-2015;  post op STEMI inferolateral wall,  SVG OM1 and SVG OM2 occluded, distal OM occlusion the calpruit, treated medically // Myoview 7/21: no ischemia, EF 65, low risk   CHF (congestive heart failure) ([name])    CKD (chronic kidney disease), stage III ([name])    Contusion of right knee 03/16/2020   Dyspnea    Elevated troponin    Erectile dysfunction    Esophageal reflux    History of atrial fibrillation    post op CABG 10-02-2015   History of kidney stones    History of non-ST elevation myocardial infarction (NSTEMI) 09/24/2015   s/p  CABG x5   History of ST elevation myocardial infarction (STEMI) 10/22/2015   inferior wall,  post op CABG 10-02-2015   Hyperlipidemia    Hypertension    Left ureteral stone    Mild atherosclerosis of both carotid arteries    Nephrolithiasis    per CT bilateral non-obstructive calculi   OSA (obstructive sleep apnea)    Peripheral artery disease    LE Arterial US 01/2019: R PTA and ATA occluded; L ATA occluded   RBBB (right bundle branch block)    Renal atrophy, right    Sleep apnea    wears cpap    ST elevation  myocardial infarction (STEMI) of inferior wall ([place]) 10/22/2015   Type 2 diabetes mellitus treated with insulin ([place])    followed by pcp   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 ([place]) 03/01/2008   Wears glasses     Past Surgical History:  Procedure Laterality Date   [place] N/A 09/26/2015   Procedure: Left Heart Cath and Coronary Angiography;  Surgeon: Troy Sine, MD;  Location: [facility];  Service: Cardiovascular;  Laterality: N/A;   CARDIAC CATHETERIZATION N/A 10/22/2015   Procedure: Left Heart Cath and Coronary Angiography;  Surgeon: Sherren Mocha, MD;  Location: [facility];  Service: Cardiovascular;  Laterality: N/A;   CATARACT EXTRACTION W/ INTRAOCULAR LENS  IMPLANT, BILATERAL  2017   COLONOSCOPY     CORONARY ARTERY BYPASS GRAFT N/A 10/02/2015   Procedure: CORONARY ARTERY BYPASS GRAFTING (CABG) X5 LIMA-LAD; SVG-DIAG; SVG-OM; SVG-PD; SVG-RAMUS TRANSESOPHAGEAL ECHOCARDIOGRAM (TEE) ENDOSCOPIC GREATER SAPHENOUS VEIN  HARVEST BILAT LE;  Surgeon: Ivin Poot, MD;  Location: [facility];  Service: Open Heart Surgery;  Laterality: N/A;   CYSTOSCOPY/URETEROSCOPY/HOLMIUM LASER/STENT PLACEMENT Left 08/10/2018   Procedure: CYSTOSCOPY/URETEROSCOPY/HOLMIUM LASER/STENT PLACEMENT;  Surgeon: Festus Aloe, MD;  Location[facility];  Service: Urology;  Laterality: Left;   CYSTOSCOPY/URETEROSCOPY/HOLMIUM LASER/STENT PLACEMENT Left 09/10/2018   Procedure: CYSTOSCOPY/URETEROSCOPY/HOLMIUM LASER/STENT EXCHANGE;  Surgeon: Festus Aloe, MD;  Location: [facility];  Service: Urology;  Laterality: Left;   LEFT HEART CATHETERIZATION WITH CORONARY ANGIOGRAM N/A 04/13/2014   Procedure: LEFT HEART CATHETERIZATION WITH CORONARY ANGIOGRAM;  Surgeon: Jacolyn Reedy, MD;  Location: [facility];  Service: Cardiovascular;  Laterality: N/A;   LEG SURGERY Right age 67   closed  reduction leg fracture   NASAL SEPTOPLASTY W/ TURBINOPLASTY Bilateral 08/30/2021   Procedure: NASAL SEPTOPLASTY WITH BILATERAL INFERIOR TURBINATE REDUCTION;  Surgeon: Jerrell Belfast, MD;  Location: [place];  Service: ENT;  Laterality: Bilateral;   POLYPECTOMY     RIGHT HEART CATH N/A 06/06/2021   Procedure: RIGHT HEART CATH;  Surgeon: Jolaine Artist, MD;  Location: [place] [facility];  Service: Cardiovascular;  Laterality: N/A;   TEE WITHOUT CARDIOVERSION N/A 10/02/2015   Procedure: TRANSESOPHAGEAL ECHOCARDIOGRAM (TEE);  Surgeon: Ivin Poot, MD;  Location: [place];  Service: Open Heart Surgery;  Laterality: N/A;   URETEROSCOPY WITH HOLMIUM LASER LITHOTRIPSY Bilateral 2004;  2005  dr grapey  _0    VASECTOMY      Social History  reports that he has never smoked. He has never used smokeless tobacco. He reports that he does not currently use alcohol. He reports that he does not use drugs.  Allergies  Allergen Reactions   Cilostazol Swelling and Other (See Comments)    Edema    Dulaglutide Nausea And Vomiting and Other (See Comments)  TRULICITY   Levofloxacin Hives, Itching and Rash   Liraglutide Other (See Comments)    Severe fatigue & insomnia   Lisinopril Itching, Rash and Cough    Family History  Problem Relation Age of Onset   Heart attack Mother    Heart attack Father    Diabetes Brother    Pancreatic cancer Brother    Diabetes Brother    Colon cancer Neg Hx    Esophageal cancer Neg Hx    Prostate cancer Neg Hx    Rectal cancer Neg Hx    Stomach cancer Neg Hx    Colon polyps Neg Hx   Reviewed on admission  Prior to Admission medications   Medication Sig Start Date End Date Taking? Authorizing Provider  acetaminophen (TYLENOL) 325 MG tablet Take 325-650 mg by mouth every 6 (six) hours as needed for headache or mild pain.    [provider]  apixaban (ELIQUIS) 5 MG TABS tablet Take 1 tablet (5 mg total) by mouth in the morning and at bedtime. 09/16/22    Freada Bergeron, MD  atorvastatin (LIPITOR) 80 MG tablet Take 1 tablet (80 mg total) by mouth daily. Patient taking differently: Take 80 mg by mouth every evening. 11/07/21 08/12/22  [name], [name]  atorvastatin (LIPITOR) 80 MG tablet Take 80 mg by mouth every evening.    [provider]  B-D UF III MINI PEN NEEDLES 31G X 5 MM MISC USE AS DIRECTED WITH LANTUS SOLOSTAR 04/02/19   [provider]  Continuous Blood Gluc Sensor (FREESTYLE LIBRE 2 SENSOR) MISC Inject 1 Device into the skin every 14 (fourteen) days.    [provider]  diphenhydrAMINE (BENADRYL ALLERGY) 25 mg capsule Take 25 mg by mouth every 6 (six) hours as needed for allergies.    [provider]  DULoxetine (CYMBALTA) 30 MG capsule Take 30 mg by mouth every morning. Take along with 60 mg capsule=90 mg    [provider]  DULoxetine (CYMBALTA) 60 MG capsule Take 60 mg by mouth every morning. Take along with 30 mg capsule=90 mg 04/12/18   [provider]  guaiFENesin-codeine (ROBITUSSIN AC) 100-10 MG/5ML syrup take 45m BY MOUTH EVERY 4 HOURS AS NEEDED Patient taking differently: Take 10 mLs by mouth every 4 (four) hours as needed for cough or congestion. 06/10/22   [name], MD  Insulin Glargine (BASAGLAR KWIKPEN) 100 UNIT/ML SOPN Inject 40 Units into the skin in the morning.    [provider]  insulin lispro (HUMALOG) 100 UNIT/ML KwikPen Inject 8 Units into the skin 3 (three) times daily with meals.    [provider]  MAGNESIUM PO Take 350 mg by mouth every evening.    [provider]  Multiple Vitamins-Minerals (SENIOR MULTIVITAMIN PLUS PO) Take 1 tablet by mouth daily with breakfast.    [provider]  nitroGLYCERIN (NITROSTAT) 0.4 MG SL tablet Place 0.4 mg under the tongue every 5 (five) minutes as needed for chest pain.    [provider]  nystatin (MYCOSTATIN/NYSTOP) powder Apply 1 Application topically 2 (two)  times daily as needed (skin irritation). Apply to the groin in the morning and evening    [provider]  nystatin cream (MYCOSTATIN) Apply 1 Application topically 2 (two) times daily as needed for dry skin. Apply to the groin in the morning and evening when not using the nystatin powder    [provider]  ondansetron (ZOFRAN-ODT) 4 MG disintegrating tablet Take 4 mg by  mouth every 8 (eight) hours as needed for nausea or vomiting (dissolve orally).    [provider]  pantoprazole (PROTONIX) 40 MG tablet TAKE 1 TABLET BY MOUTH EVERY DAY Patient taking differently: Take 40 mg by mouth in the morning. 11/25/19   [name], MD  zaleplon ([name]) 5 MG capsule Take 5 mg by mouth at bedtime. 08/26/22   [provider]    Physical Exam: Vitals:   09/17/22 1300 09/17/22 1315 09/17/22 1330 09/17/22 1345  BP: (!) 166/127 (!) 162/120 (!) 165/113 (!) 170/87  Pulse: 95 92 74 90  Resp: (!) 25 20 (!) 25 18  Temp:      TempSrc:      SpO2: 93% 94% 94% 95%  Weight:      Height:        Physical Exam Constitutional:      General: He is not in acute distress.    Appearance: Normal appearance.  HENT:     Head: Normocephalic and atraumatic.     Mouth/Throat:     Mouth: Mucous membranes are moist.     Pharynx: Oropharynx is clear.  Eyes:     Extraocular Movements: Extraocular movements intact.     Pupils: Pupils are equal, round, and reactive to light.  Cardiovascular:     Rate and Rhythm: Normal rate and regular rhythm.     Pulses: Normal pulses.     Heart sounds: Normal heart sounds.  Pulmonary:     Effort: Pulmonary effort is normal. No respiratory distress.     Breath sounds: Normal breath sounds.  Abdominal:     General: Bowel sounds are normal. There is no distension.     Palpations: Abdomen is soft.     Tenderness: There is no abdominal tenderness.  Musculoskeletal:        General: No swelling or deformity.  Skin:    General: Skin is warm and  dry.  Neurological:     Comments: Mental Status: Patient is awake, alert, oriented x2 (year wrong though this recently changed) No signs of aphasia or neglect Cranial Nerves: II: Pupils equal, round, and reactive to light.   III,IV, VI: EOMI without ptosis or diploplia.  V: Facial sensation is symmetric to light touch. VII: Facial movement is symmetric.  VIII: hearing is intact to voice X: Uvula elevates symmetrically XI: Shoulder shrug is symmetric. XII: tongue is midline without atrophy or fasciculations.  Motor: Good effort thorughout, at Least 5/5 bilateral UE, 5/5 bilateral lower extremitiy  Sensory: Sensation is grossly intact bilateral UEs & LEs    Labs on Admission: I have personally reviewed following labs and imaging studies  CBC: Recent Labs  Lab 09/17/22 1201  WBC 13.6*  NEUTROABS 9.0*  HGB 14.2  14.3  HCT 43.3  42.0  MCV 93.3  PLT 825    Basic Metabolic Panel: Recent Labs  Lab 09/17/22 1201  NA 144  143  K 4.0  4.0  CL 106  107  CO2 21*  GLUCOSE 78  79  BUN 20  19  CREATININE 1.99*  1.90*  CALCIUM 9.1    GFR: Estimated Creatinine Clearance: 31.9 mL/min (A) (by C-G formula based on SCr of 1.9 mg/dL (H)).  Liver Function Tests: Recent Labs  Lab 09/17/22 1201  AST 35  ALT 29  ALKPHOS 131*  BILITOT 1.8*  PROT 5.8*  ALBUMIN 3.0*    Urine analysis:    Component Value Date/Time   COLORURINE YELLOW 01/29/2020 2234   APPEARANCEUR  CLEAR 01/29/2020 2234   LABSPEC 1.016 01/29/2020 2234   PHURINE 6.0 01/29/2020 [place] 01/29/2020 2234   [place] 01/29/2020 [place] 01/29/2020 [place] 01/29/2020 2234   PROTEINUR 100 (A) 01/29/2020 2234   UROBILINOGEN 0.2 10/02/2009 1604   NITRITE NEGATIVE 01/29/2020 2234   [place] 01/29/2020 2234    Radiological Exams on Admission: CT ANGIO HEAD NECK W WO CM (CODE STROKE)  Result Date: 09/17/2022 CLINICAL DATA:  Neuro  deficit, acute, stroke suspected. EXAM: CT ANGIOGRAPHY HEAD AND NECK TECHNIQUE: Multidetector CT imaging of the head and neck was performed using the standard protocol during bolus administration of intravenous contrast. Multiplanar CT image reconstructions and MIPs were obtained to evaluate the vascular anatomy. Carotid stenosis measurements (when applicable) are obtained utilizing NASCET criteria, using the distal internal carotid diameter as the denominator. RADIATION DOSE REDUCTION: This exam was performed according to the departmental dose-optimization program which includes automated exposure control, adjustment of the mA and/or kV according to patient size and/or use of iterative reconstruction technique. CONTRAST:  19m OMNIPAQUE IOHEXOL 350 MG/ML SOLN COMPARISON:  None Available. FINDINGS: CTA NECK FINDINGS Aortic arch: Normal variant 4 vessel aortic arch with the left vertebral artery arising directly from the arch. Minimal atherosclerosis without significant stenosis of the arch vessel origins. Right carotid system: Patent with mild calcified and soft plaque in the carotid bulb. No evidence of a significant stenosis or dissection. Left carotid system: Patent with a small to moderate amount of predominantly calcified plaque at the carotid bifurcation extending into the carotid bulb without significant associated stenosis. Additional soft plaque near the distal aspect of the carotid bulb results in 50% ICA stenosis. Vertebral arteries: The vertebral arteries are patent in the neck with the right being strongly dominant. Scattered atherosclerotic plaque throughout the right vertebral artery results in up to moderate V1 and mild V3 stenosis. Skeleton: Grade 1 anterolisthesis of C3 on C4 and grade 1 retrolisthesis of C4 on C5. Asymmetrically advanced left facet arthrosis at C2-3 and C3-4. Other neck: No evidence of cervical lymphadenopathy or mass. Upper chest: Clear lung apices. Review of the MIP images  confirms the above findings CTA HEAD FINDINGS Anterior circulation: The internal carotid arteries are patent from skull base to carotid termini with mild cavernous and paraclinoid stenoses bilaterally as well as a mild-to-moderate distal right petrous stenosis. ACAs and MCAs are patent without evidence of a proximal branch occlusion or significant proximal stenosis. No aneurysm is identified. Posterior circulation: The intracranial right vertebral artery is patent with moderate multifocal stenoses due to prominent atherosclerotic plaque. The intracranial left vertebral artery is small and patent proximally but is occluded distal to the PICA origin. The basilar artery is patent and congenitally small with mild diffuse atherosclerotic irregularity. There are large posterior communicating arteries bilaterally with asymmetrically prominent hypoplasia of the left P1 segment. Both PCAs are patent with mild atherosclerotic irregularity of the branch vessels but no evidence of a flow limiting proximal stenosis. No aneurysm is identified. Venous sinuses: As permitted by contrast timing, patent. Anatomic variants: Fetal left PCA. Review of the MIP images confirms the above findings These results were communicated to Dr. BCurly Shoresat 12:21 pm on 09/17/2022 by text page via the [facility]aging system. IMPRESSION: 1. 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 2. Patent and dominant right vertebral artery with moderate proximal and distal stenoses. 3. Mild-to-moderate intracranial ICA stenoses. 4. 50% proximal left ICA stenosis. 5.  Aortic  Atherosclerosis (ICD10-I70.0). Electronically Signed   By: Logan Bores M.D.   On: 09/17/2022 12:42   CT HEAD CODE STROKE WO CONTRAST  Result Date: 09/17/2022 CLINICAL DATA:  Code stroke. Neuro deficit, acute, stroke suspected. EXAM: CT HEAD WITHOUT CONTRAST TECHNIQUE: Contiguous axial images were obtained from the base of the skull through the vertex without intravenous  contrast. RADIATION DOSE REDUCTION: This exam was performed according to the departmental dose-optimization program which includes automated exposure control, adjustment of the mA and/or kV according to patient size and/or use of iterative reconstruction technique. COMPARISON:  Head CT and MRI 08/12/2022 FINDINGS: Brain: There is no evidence of an acute supratentorial infarct, intracranial hemorrhage, mass, midline shift, or extra-axial fluid collection. Motion and streak artifact limited assessment of the posterior fossa. There is mild cerebral atrophy. Hypodensities in the cerebral white matter bilaterally are unchanged and nonspecific but compatible with mild chronic small vessel ischemic disease. Vascular: Calcified atherosclerosis at the skull base. No hyperdense vessel. Skull: No fracture or suspicious osseous lesion is identified within limitations of motion artifact. Sinuses/Orbits: Mucous retention cysts in the maxillary sinuses. Clear mastoid air cells. Bilateral cataract extraction. Other: None. ASPECTS [facility] Stroke Program Early CT Score) - Ganglionic level infarction (caudate, lentiform nuclei, internal capsule, insula, M1-M3 cortex): 7 - Supraganglionic infarction (M4-M6 cortex): 3 Total score (0-10 with 10 being normal): 10 IMPRESSION: 1. No evidence of acute intracranial abnormality. Limited assessment of the posterior fossa. 2. ASPECTS of 10. 3. Mild chronic small vessel ischemic disease. These results were communicated to Dr. Curly Shores at 12:16 pm on 09/17/2022 by text page via the [facility] messaging system. Electronically Signed   By: Logan Bores M.D.   On: 09/17/2022 12:22    EKG: Independently reviewed.  Sinus rhythm at 94 bpm.  PVC noted.  Right bundle blanch block.  Similar to previous.  Assessment/Plan Active Problems:   Hyperlipidemia LDL goal <70   Essential hypertension   Coronary artery disease involving native coronary artery of native heart without angina pectoris   S/P CABG x 5    Peripheral artery disease   OSA on CPAP   Chronic heart failure with preserved ejection fraction (HFpEF) ([name])   Paroxysmal atrial fibrillation ([name])   Diabetes mellitus, type 2 ([name])   Anxiety   Gastro-esophageal reflux disease without esophagitis   Chronic kidney disease, stage 3b ([name])   Myasthenia gravis ([place])   Altered mental status Rule out stroke ?Seizure  > Patient presenting after abnormal gait and episode of confusion at home as per HPI.  Having difficulty with the mailbox and drive in the car when he arrived home today.  Was nonverbal per EMS. > Did reportedly improved after dose of Ativan in the ED to help get MRI, raising concern for possible seizure. > Now more alert and oriented and able to answer questions. > Neurology patient on arrival and recommending MRI and further workup will depend on these results to evaluate for stroke versus possible seizure versus other.  - Monitor on telemetry - Follow-up MRI - Appreciate neurology recommendations - Further workup pending MRI results and neurology recommendations - Will hold centrally acting medications for now including [name] and duloxetine. ADDENDUM > MRI does show release evidence of subacute infarcts.  Will start stroke workup and await further recommendations from neurology. - Likely outside window for permissive hypertension - Daily aspirin,  - Continue home - Echocardiogram  - A1C  - Lipid panel  - Tele monitoring  - SLP eval - PT/OT - EEG  CAD HLD > Status post CABG > Currently on beta-blocker for bradycardia with a and not on ACE/ARB due to CKD. - Continue home statin and Eliquis when tolerating p.o.  CKD 3B > Creatinine at 1.99 today, appears to be near patient's most recent baseline of 1.7 and 2. > Has reportedly had decreased p.o. intake the last couple days.  1 L ordered in the ED - Monitor response to liter ordered in ED - Trend renal function and electrolytes  Chronic diastolic CHF > Last  echo was in 2022 with EF of 60-60%, G1 DD, mildly reduced RV function - Holding home Lasix in the setting of decreased p.o. intake, this has been held long-term after last admission  Atrial fibrillation > Not currently in A-fib - Continue home Eliquis  Hypertension Orthostatic hypotension > Has had issues with orthostatic hypotension recently, not currently on any antihypertensive medications.  Diabetes > On 40 units long-acting insulin at home and 8 units with meals - 20 units long-acting insulin daily - SSI  GERD - Continue home PPI  Anxiety Depression - Holding home duloxetine as above  PAD - Continue home atorvastatin as above  OSA - Not on CPAP  DVT prophylaxis: Eliquis Code Status:   Full Family Communication:  Wife updated by phone, and states she is unwell as well and is unable to care for him if he needs significant help. Disposition Plan:   Patient is from:  [place] to:  Home  Anticipated DC date:  1 to 3 days  Anticipated DC barriers: None  Consults called:  Neurology Admission status:  Observation, telemetry  Severity of Illness: The appropriate patient status for this patient is OBSERVATION. Observation status is judged to be reasonable and necessary in order to provide the required intensity of service to ensure the patient's safety. The patient's presenting symptoms, physical exam findings, and initial radiographic and laboratory data in the context of their medical condition is felt to place them at decreased risk for further clinical deterioration. Furthermore, it is anticipated that the patient will be medically stable for discharge from the hospital within 2 midnights of admission.    Marcelyn Bruins MD [facility]  How to contact the [facility] Attending or Consulting provider [place] or covering provider during after hours [place], for this patient?   Check the care team in [facility] and look for a) attending/consulting [facility] provider  listed and b) the [facility] team listed Log into [email or web address] and use [place]'s universal password to access. If you do not have the password, please contact the hospital operator. Locate the [facility] provider you are looking for under [facility] and page to a number that you can be directly reached. If you still have difficulty reaching the provider, please page the [facility] (Director on Call) for the Hospitalists listed on amion for assistance.  09/17/2022, 3:00 PM

## 2022-09-17 NOTE — ED Notes (Signed)
Ammonia sent at this time. Patient reminded of the need for a urine sample at this time and provided with urinal

## 2022-09-17 NOTE — ED Notes (Signed)
RN attempted to assist pt with urinal but unable to pee at this time.

## 2022-09-17 NOTE — ED Triage Notes (Signed)
Pt bib ems from home c/o CODE STROKE. Pt LKW 1045. The pt wife notice pt was trying to open mailbox the wrong way and when driving up the drive pt was driving the car at an angle.  EMS stated pt was nonverbal and having involuntary movements. Right facial droop.   BP 158/83 CBG 108   HR 90 RA 97%  Hx DM2

## 2022-09-17 NOTE — Consult Note (Signed)
Neurology Consultation  Reason for Consult: Code Stroke  Referring Physician: Schveing   CC: Altered mental status   History is obtained from:Wife, EMS, Chart Review  HPI: Fernando Hess is a 77 y.o. male with a past medical history of CAD, afib on eliquis, CHF, CKD, GERD, CABG, HLD, HTN, OSA, DM2 who presents with altered mental status. He went to a doctors appointment with his wife and was walking slow/unsteady and speaking softly during the appointment. When they got home he pulled up to the mailbox and couldn't figure out how to open it, he then couldn't figure out how to shift the car back into drive and was trying to use the heat control to shift the car. His wife tells me that home health has not been coming to the house for the last couple of weeks but he did take all of his medications this morning. He has been having episodes of diarrhea for the last couple of days. He was supposed to get blood work done on Tuesday and he did not eat on Monday because he "wanted his blood sugar to be low."  On arrival BP is 151/83 and glucose is 80.    LKW: 7893 tpa given?: no, on eliquis Premorbid modified Rankin scale (mRS): 3  ROS: Unable to obtain due to altered mental status.   Past Medical History:  Diagnosis Date   Acute renal failure (ARF) ([place]) 09/24/2015   Acute stress disorder 11/13/2021   Atrial premature complexes    CAD (coronary artery disease) of artery bypass graft    Early occlusion of saphenous vein graft to intermediate and marginal branch in February 2007 following bypass grafting    CAD (coronary artery disease), native coronary artery 2017   hx NSTEMI 09-24-2015  s/p  CABG x5 on 10-02-2015;  post op STEMI inferolateral wall,  SVG OM1 and SVG OM2 occluded, distal OM occlusion the calpruit, treated medically // Myoview 7/21: no ischemia, EF 65, low risk   CHF (congestive heart failure) ([name])    CKD (chronic kidney disease), stage III ([name])    Contusion of right knee  03/16/2020   Dyspnea    Elevated troponin    Erectile dysfunction    Esophageal reflux    History of atrial fibrillation    post op CABG 10-02-2015   History of kidney stones    History of non-ST elevation myocardial infarction (NSTEMI) 09/24/2015   s/p  CABG x5   History of ST elevation myocardial infarction (STEMI) 10/22/2015   inferior wall,  post op CABG 10-02-2015   Hyperlipidemia    Hypertension    Left ureteral stone    Mild atherosclerosis of both carotid arteries    Nephrolithiasis    per CT bilateral non-obstructive calculi   OSA (obstructive sleep apnea)    Peripheral artery disease    LE Arterial US 01/2019: R PTA and ATA occluded; L ATA occluded   RBBB (right bundle branch block)    Renal atrophy, right    Sleep apnea    wears cpap    ST elevation myocardial infarction (STEMI) of inferior wall ([place]) 10/22/2015   Type 2 diabetes mellitus treated with insulin ([place])    followed by pcp   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 He has never used smokeless tobacco. He reports that he does not currently use alcohol. He reports that he does not use drugs.  Medications No current facility-administered medications for this encounter.  Current Outpatient Medications:    acetaminophen (TYLENOL) 325 MG tablet, Take 325-650 mg by mouth every 6 (six) hours as needed for headache or mild pain., Disp: , Rfl:    apixaban (ELIQUIS) 5 MG TABS tablet, Take 1 tablet (5 mg total) by mouth in the morning and  at bedtime., Disp: 180 tablet, Rfl: 3   atorvastatin (LIPITOR) 80 MG tablet, Take 1 tablet (80 mg total) by mouth daily. (Patient taking differently: Take 80 mg by mouth every evening.), Disp: 90 tablet, Rfl: 3   atorvastatin (LIPITOR) 80 MG tablet, Take 80 mg by mouth every evening., Disp: , Rfl:    B-D UF III MINI PEN NEEDLES 31G X 5 MM MISC, USE AS DIRECTED WITH LANTUS SOLOSTAR, Disp: , Rfl:    Continuous Blood Gluc Sensor (FREESTYLE LIBRE 2 SENSOR) MISC, Inject 1 Device into the skin every 14 (fourteen) days., Disp: , Rfl:    diphenhydrAMINE (BENADRYL ALLERGY) 25 mg capsule, Take 25 mg by mouth every 6 (six) hours as needed for allergies., Disp: , Rfl:    DULoxetine (CYMBALTA) 30 MG capsule, Take 30 mg by mouth every morning. Take along with 60 mg capsule=90 mg, Disp: , Rfl:    DULoxetine (CYMBALTA) 60 MG capsule, Take 60 mg by mouth every morning. Take along with 30 mg capsule=90 mg, Disp: , Rfl: 4   guaiFENesin-codeine (ROBITUSSIN AC) 100-10 MG/5ML syrup, take 35m BY MOUTH EVERY 4 HOURS AS NEEDED (Patient taking differently: Take 10 mLs by mouth every 4 (four) hours as needed for cough or congestion.), Disp: 120 mL, Rfl: 0   Insulin Glargine (BASAGLAR KWIKPEN) 100 UNIT/ML SOPN, Inject 40 Units into the skin in the morning., Disp: , Rfl:    insulin lispro (HUMALOG) 100 UNIT/ML KwikPen, Inject 8 Units into the skin 3 (three) times daily with meals., Disp: , Rfl:    MAGNESIUM PO, Take 350 mg by mouth every evening., Disp: , Rfl:    Multiple Vitamins-Minerals (SENIOR MULTIVITAMIN PLUS PO), Take 1 tablet by mouth daily with breakfast., Disp: , Rfl:    nitroGLYCERIN (NITROSTAT) 0.4 MG SL tablet, Place 0.4 mg under the tongue every 5 (five) minutes as needed for chest pain., Disp: , Rfl:    nystatin (MYCOSTATIN/NYSTOP) powder, Apply 1 Application topically 2 (two) times daily as needed (skin irritation). Apply to the groin in the morning and evening, Disp: , Rfl:    nystatin cream (MYCOSTATIN), Apply  1 Application topically 2 (two) times daily as needed for dry skin. Apply to the groin in the morning and evening when not using the nystatin powder, Disp: , Rfl:    ondansetron (ZOFRAN-ODT) 4 MG disintegrating tablet, Take 4 mg by mouth every 8 (eight) hours as needed for nausea or vomiting (dissolve orally)., Disp: , Rfl:    pantoprazole (PROTONIX) 40 MG tablet, TAKE 1 TABLET BY MOUTH EVERY DAY (Patient taking differently: Take 40 mg by mouth  in the morning.), Disp: 90 tablet, Rfl: 2   zaleplon ([name]) 5 MG capsule, Take 5 mg by mouth at bedtime., Disp: , Rfl:   Exam: Current vital signs: There were no vitals taken for this visit. Vital signs in last 24 hours:    GENERAL: Awake, alert in NAD HEENT: - Normocephalic and atraumatic, dry mm, no LN++, no Thyromegally LUNGS - Clear to auscultation bilaterally with no wheezes CV - S1S2 RRR, no m/r/g, equal pulses bilaterally. ABDOMEN - Soft, nontender, nondistended with normoactive BS Ext: warm, well perfused, intact peripheral pulses, no edema  NEURO:  Mental Status: Awake, eyes open. Not following commands, no speech output and does not track examiner on initial evaluation but his exam gradually improves over the course of the evaluation with more and more speech Cranial Nerves: PERRL 102m/brisk. EOMI, visual fields full, no facial asymmetry, facial sensation intact, hearing intact, tongue/uvula/soft palate midline, normal sternocleidomastoid and trapezius muscle strength. No evidence of tongue atrophy or fasciculations Motor:  RUE 4/5 LUE 4/5 RLE 4/5 LLE 4/5 Tone: is normal and bulk is normal Sensation- Intact to light touch bilaterally Coordination: FTN intact bilaterally, no ataxia in BLE. Gait- deferred  NIHSS components Score: Comment  1a Level of Conscious 0'[x]'$  1'[]'$  2'[]'$  3'[]'$         1b LOC Questions 0'[]'$  1'[]'$  2'[x]'$           1c LOC Commands 0'[]'$  1'[]'$  2'[]'$           2 Best Gaze 0'[]'$  1'[x]'$  2'[]'$         Leftward  3 Visual 0'[x]'$  1'[]'$  2'[]'$  3'[]'$          4 Facial Palsy 0'[]'$  1'[x]'$  2'[]'$  3'[]'$         5a Motor Arm - left 0'[]'$  1'[x]'$  2'[]'$  3'[]'$  4'[]'$  UN'[]'$     5b Motor Arm - Right 0'[]'$  1'[x]'$  2'[]'$  3'[]'$  4'[]'$  UN'[]'$     6a Motor Leg - Left 0'[]'$  1'[x]'$  2'[]'$  3'[]'$  4'[]'$  UN'[]'$     6b Motor Leg - Right 0'[]'$  1'[x]'$  2'[]'$  3'[]'$  4'[]'$  UN'[]'$     7 Limb Ataxia 0'[x]'$  1'[]'$  2'[]'$  3'[]'$  UN'[]'$       8 Sensory 0'[x]'$  1'[]'$  2'[]'$  UN'[]'$         9 Best Language 0'[]'$  1'[]'$  2'[]'$  3'[x]'$         10 Dysarthria 0'[]'$  1'[]'$  2'[x]'$  UN'[]'$         11 Extinct. and Inattention 0'[x]'$  1'[]'$  2'[]'$           TOTAL: 13        Labs I have reviewed labs in epic and the results pertinent to this consultation are:  CBC    Component Value Date/Time   WBC 9.2 09/04/2022 1500   RBC 4.69 09/04/2022 1500   HGB 13.9 09/04/2022 1500   HGB 15.8 05/31/2021 1438   HCT 44.0 09/04/2022 1500   HCT 45.2 05/31/2021 1438   PLT 279 09/04/2022 1500   PLT 251 05/31/2021 1438   MCV 93.8 09/04/2022 1500   MCV 87 05/31/2021 1438   MCH 29.6 09/04/2022 1500   MCHC 31.6 09/04/2022 1500   RDW 17.0 (H) 09/04/2022 1500   RDW 13.2 05/31/2021 1438   LYMPHSABS 0.8 08/12/2022 1117   MONOABS 0.9 08/12/2022 1117   EOSABS 0.2 08/12/2022 1117   BASOSABS 0.1 08/12/2022 1117    CMP     Component Value Date/Time   NA 140 09/05/2022 0818   NA 138 11/06/2021 1031   K 5.3 (H) 09/05/2022 0818   CL 110 09/05/2022 0818  CO2 24 09/05/2022 0818   GLUCOSE 139 (H) 09/05/2022 0818   BUN 29 (H) 09/05/2022 0818   BUN 34 (H) 11/06/2021 1031   CREATININE 2.00 (H) 09/05/2022 0818   CALCIUM 8.1 (L) 09/05/2022 0818   PROT 5.9 (L) 09/04/2022 1500   PROT 6.5 01/22/2022 0815   ALBUMIN 2.9 (L) 09/04/2022 1500   ALBUMIN 4.1 01/22/2022 0815   AST 27 09/04/2022 1500   ALT 30 09/04/2022 1500   ALKPHOS 125 09/04/2022 1500   BILITOT 1.5 (H) 09/04/2022 1500   BILITOT 0.5 01/22/2022 0815   GFRNONAA 34 (L) 09/05/2022 0818   GFRAA 39 (L) 05/22/2020 0000    Lipid Panel     Component Value Date/Time   CHOL 121 01/22/2022 0815   TRIG 120 01/22/2022 0815   HDL 42 01/22/2022 0815   CHOLHDL  2.9 01/22/2022 0815   CHOLHDL 2.7 10/23/2015 0515   VLDL 20 10/23/2015 0515   LDLCALC 57 01/22/2022 0815   Lab Results  Component Value Date   HGBA1C 14.6 (H) 08/13/2022    Lab Results  Component Value Date   CHOL 121 01/22/2022   HDL 42 01/22/2022   LDLCALC 57 01/22/2022   TRIG 120 01/22/2022   CHOLHDL 2.9 01/22/2022     Imaging I have reviewed the images obtained:  CT-head 1. No evidence of acute intracranial abnormality. Limited assessment of the posterior fossa. 2. ASPECTS of 10. 3. Mild chronic small vessel ischemic disease  CTA Angio Head and Neck  1. 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 2. Patent and dominant right vertebral artery with moderate proximal and distal stenoses. 3. Mild-to-moderate intracranial ICA stenoses. 4. 50% proximal left ICA stenosis. 5.  Aortic Atherosclerosis (ICD10-I70.0).  MRI brain with and without contrast 1. Small focus of hyperintense signal on diffusion-weighted imaging in the right occipital lobe is new compared to prior MRI brain dated 08/12/2022, but without a definite correlate on the ADC map. There is also contrast enhancement in this region, suggestive of a subacute infarct. Additional focus of contrast enhancement in the medial aspect of the right occipital lobe, which is also favored to represent an additional site of a subacute infarct. Recommend repeat contrast enhanced brain MRI in 4 weeks to assess for interval change. 2. Asymmetric contrast enhancement of the mastoid segment of the right facial nerve, which is nonspecific but can be seen in the setting of Bell's palsy.  Assessment:  77 y.o. male with a past medical history of CAD, afib on eliquis, CHF, CKD, GERD, CABG, HLD, HTN, OSA, DM2 who presents with altered mental status. He went to a doctors appointment with his wife and was walking slow/unsteady and speaking softly during the appointment. When they got home he pulled up to the mailbox  and couldn't figure out how to open it, he then couldn't figure out how to shift the car back into drive and was trying to use the heat control to shift the car, subsequently had right-sided weakness and global aphasia. On arrival BP is 151/83 and glucose is 80.   Overall the story is concerning for possible focal seizure with impaired awareness, especially given MRI is negative for stroke in the left MCA distribution though interestingly it is positive for some subacute strokes in the right hemisphere which would not explain his symptoms.  Left MCA territory TIA remains a possibility, with the other findings being more related to a likely underlying dementia.  I do favor the MRI findings on the right side to  be subacute strokes but agree short interval MRI is reasonable to exclude malignancy  Of note his clinical examination is not consistent with Bell's palsy and I favor that MRI finding to be an artifact  Impression: Seizure vs stroke   Recommendations: - MRI brain w and w/o contrast  - HgbA1c, fasting lipid panel - Frequent neuro checks, every 4 hours - Resume eliquis once able to take PO medications given this is not an acute stroke - Risk factor modification - Telemetry monitoring - PT consult, OT consult, Speech consult if patient does not return fully to baseline - Repeat routine EEG given event as described is concerning for possible seizure and MRI brain findings do not correlate with symptomatology - Outpatient MRI brain in 4-8 weeks to follow-up incidentally found subacute strokes to confirm expected evolution and exclude malignancy - Correct metabolic derangements - Delirium precautions   Patient seen and examined by NP/APP with MD. MD to update note as needed.   Janine Ores, DNP, FNP-BC [facility] Pager: [phone]  Attending Neurologist's note:  I personally saw this patient, gathering history, performing a full neurologic examination, reviewing relevant  labs, personally reviewing relevant imaging including head CT, CTA, MRI brain, and formulated the assessment and plan, adding the note above for completeness and clarity to accurately reflect my thoughts  Lesleigh Noe MD-PhD [facility] [phone] Available 7 AM to 7 PM, outside these hours please contact Neurologist on call listed on AMION

## 2022-09-17 NOTE — ED Notes (Signed)
RN removed Dexacom for MRI scan. Dexacom placed in container and placed in pt belongings bag

## 2022-09-17 NOTE — ED Notes (Signed)
Patient transported to MRI 

## 2022-09-17 NOTE — ED Notes (Signed)
RN called pt wife to update her on his care. Pt spoke with wife as well.    Pt states he has a headache. RN notified EDP

## 2022-09-17 NOTE — ED Notes (Signed)
Pt has returned from MRI. 

## 2022-09-17 NOTE — Code Documentation (Addendum)
Fernando Hess is a 77 yr old male With a past medical history of AF and CKD. He was last known well today at 1045, after which time he began acting erratically while driving and flapping his arms around. He takes Eliquis.    Stroke team at bridge for pt arrival. Labs, CBG drawn, airway cleared by EDP. Pt to CT with team. NIHSS 3, for rt facial droop and aphasia. Imaging completed: CT and CTA. Per Dr. Curly [place], CT negative for acute hemorrhage. CTA negative for LVO.     Pt taken back to room 19 where his workup will continue. He will need q 2 hr VS and NIHSS. He is not eligible for TNK due to Eliquis. He is not a candidate for NIR due to no LVO. Bedside handoff with ED RN complete.

## 2022-09-18 ENCOUNTER — Inpatient Hospital Stay (HOSPITAL_COMMUNITY): Payer: Medicare Other

## 2022-09-18 DIAGNOSIS — I130 Hypertensive heart and chronic kidney disease with heart failure and stage 1 through stage 4 chronic kidney disease, or unspecified chronic kidney disease: Secondary | ICD-10-CM | POA: Diagnosis present

## 2022-09-18 DIAGNOSIS — Z1152 Encounter for screening for COVID-19: Secondary | ICD-10-CM | POA: Diagnosis not present

## 2022-09-18 DIAGNOSIS — R2681 Unsteadiness on feet: Secondary | ICD-10-CM | POA: Diagnosis present

## 2022-09-18 DIAGNOSIS — K219 Gastro-esophageal reflux disease without esophagitis: Secondary | ICD-10-CM | POA: Diagnosis present

## 2022-09-18 DIAGNOSIS — R197 Diarrhea, unspecified: Secondary | ICD-10-CM | POA: Diagnosis present

## 2022-09-18 DIAGNOSIS — R4182 Altered mental status, unspecified: Secondary | ICD-10-CM

## 2022-09-18 DIAGNOSIS — R29703 NIHSS score 3: Secondary | ICD-10-CM | POA: Diagnosis present

## 2022-09-18 DIAGNOSIS — I5032 Chronic diastolic (congestive) heart failure: Secondary | ICD-10-CM | POA: Diagnosis present

## 2022-09-18 DIAGNOSIS — E1151 Type 2 diabetes mellitus with diabetic peripheral angiopathy without gangrene: Secondary | ICD-10-CM | POA: Diagnosis present

## 2022-09-18 DIAGNOSIS — E113392 Type 2 diabetes mellitus with moderate nonproliferative diabetic retinopathy without macular edema, left eye: Secondary | ICD-10-CM | POA: Diagnosis present

## 2022-09-18 DIAGNOSIS — G9340 Encephalopathy, unspecified: Secondary | ICD-10-CM | POA: Diagnosis present

## 2022-09-18 DIAGNOSIS — G9341 Metabolic encephalopathy: Secondary | ICD-10-CM | POA: Diagnosis present

## 2022-09-18 DIAGNOSIS — R4701 Aphasia: Secondary | ICD-10-CM | POA: Diagnosis present

## 2022-09-18 DIAGNOSIS — E1165 Type 2 diabetes mellitus with hyperglycemia: Secondary | ICD-10-CM

## 2022-09-18 DIAGNOSIS — I2510 Atherosclerotic heart disease of native coronary artery without angina pectoris: Secondary | ICD-10-CM | POA: Diagnosis present

## 2022-09-18 DIAGNOSIS — N1832 Chronic kidney disease, stage 3b: Secondary | ICD-10-CM | POA: Diagnosis present

## 2022-09-18 DIAGNOSIS — F32A Depression, unspecified: Secondary | ICD-10-CM | POA: Diagnosis present

## 2022-09-18 DIAGNOSIS — I25810 Atherosclerosis of coronary artery bypass graft(s) without angina pectoris: Secondary | ICD-10-CM | POA: Diagnosis present

## 2022-09-18 DIAGNOSIS — G7000 Myasthenia gravis without (acute) exacerbation: Secondary | ICD-10-CM | POA: Diagnosis present

## 2022-09-18 DIAGNOSIS — G4733 Obstructive sleep apnea (adult) (pediatric): Secondary | ICD-10-CM | POA: Diagnosis present

## 2022-09-18 DIAGNOSIS — I480 Paroxysmal atrial fibrillation: Secondary | ICD-10-CM | POA: Diagnosis present

## 2022-09-18 DIAGNOSIS — Z794 Long term (current) use of insulin: Secondary | ICD-10-CM | POA: Diagnosis not present

## 2022-09-18 DIAGNOSIS — E785 Hyperlipidemia, unspecified: Secondary | ICD-10-CM | POA: Diagnosis present

## 2022-09-18 DIAGNOSIS — Z961 Presence of intraocular lens: Secondary | ICD-10-CM | POA: Diagnosis present

## 2022-09-18 DIAGNOSIS — E1122 Type 2 diabetes mellitus with diabetic chronic kidney disease: Secondary | ICD-10-CM | POA: Diagnosis present

## 2022-09-18 DIAGNOSIS — F0394 Unspecified dementia, unspecified severity, with anxiety: Secondary | ICD-10-CM | POA: Diagnosis present

## 2022-09-18 LAB — COMPREHENSIVE METABOLIC PANEL
ALT: 26 U/L (ref 0–44)
AST: 27 U/L (ref 15–41)
Albumin: 2.4 g/dL — ABNORMAL LOW (ref 3.5–5.0)
Alkaline Phosphatase: 108 U/L (ref 38–126)
Anion gap: 7 (ref 5–15)
BUN: 23 mg/dL (ref 8–23)
CO2: 23 mmol/L (ref 22–32)
Calcium: 8.1 mg/dL — ABNORMAL LOW (ref 8.9–10.3)
Chloride: 111 mmol/L (ref 98–111)
Creatinine, Ser: 2.01 mg/dL — ABNORMAL HIGH (ref 0.61–1.24)
GFR, Estimated: 33 mL/min — ABNORMAL LOW (ref >60)
Glucose, Bld: 167 mg/dL — ABNORMAL HIGH (ref 70–99)
Potassium: 3.6 mmol/L (ref 3.5–5.1)
Sodium: 141 mmol/L (ref 135–145)
Total Bilirubin: 1 mg/dL (ref 0.3–1.2)
Total Protein: 4.7 g/dL — ABNORMAL LOW (ref 6.5–8.1)

## 2022-09-18 LAB — LIPID PANEL
Cholesterol: 118 mg/dL (ref 0–200)
HDL: 33 mg/dL — ABNORMAL LOW (ref >40)
LDL Cholesterol: 66 mg/dL (ref 0–99)
Total CHOL/HDL Ratio: 3.6 RATIO
Triglycerides: 97 mg/dL (ref <150)
VLDL: 19 mg/dL (ref 0–40)

## 2022-09-18 LAB — CBG MONITORING, ED: Glucose-Capillary: 415 mg/dL — ABNORMAL HIGH (ref 70–99)

## 2022-09-18 LAB — GLUCOSE, CAPILLARY
Glucose-Capillary: 272 mg/dL — ABNORMAL HIGH (ref 70–99)
Glucose-Capillary: 163 mg/dL — ABNORMAL HIGH (ref 70–99)
Glucose-Capillary: 193 mg/dL — ABNORMAL HIGH (ref 70–99)

## 2022-09-18 LAB — CBC
HCT: 38.5 % — ABNORMAL LOW (ref 39.0–52.0)
Hemoglobin: 12.1 g/dL — ABNORMAL LOW (ref 13.0–17.0)
MCH: 30.6 pg (ref 26.0–34.0)
MCHC: 31.4 g/dL (ref 30.0–36.0)
MCV: 97.2 fL (ref 80.0–100.0)
Platelets: 220 10*3/uL (ref 150–400)
RBC: 3.96 MIL/uL — ABNORMAL LOW (ref 4.22–5.81)
RDW: 17.6 % — ABNORMAL HIGH (ref 11.5–15.5)
WBC: 10.5 10*3/uL (ref 4.0–10.5)
nRBC: 0 % (ref 0.0–0.2)

## 2022-09-18 MED ORDER — DIAZEPAM 5 MG/ML IJ SOLN
5.0000 mg | Freq: Once | INTRAMUSCULAR | Status: AC
  Administered 2022-09-18: 5 mg via INTRAVENOUS
  Filled 2022-09-18: qty 2

## 2022-09-18 MED ORDER — LEVETIRACETAM 250 MG PO TABS
250.0000 mg | ORAL_TABLET | Freq: Two times a day (BID) | ORAL | Status: DC
  Administered 2022-09-18: 250 mg via ORAL
  Filled 2022-09-18: qty 1

## 2022-09-18 MED ORDER — QUETIAPINE FUMARATE 25 MG PO TABS
25.0000 mg | ORAL_TABLET | Freq: Two times a day (BID) | ORAL | Status: DC
  Administered 2022-09-18 – 2022-09-19 (×2): 25 mg via ORAL
  Filled 2022-09-18 (×2): qty 1

## 2022-09-18 MED ORDER — DULOXETINE HCL 30 MG PO CPEP
30.0000 mg | ORAL_CAPSULE | Freq: Every morning | ORAL | Status: DC
  Administered 2022-09-18 – 2022-09-19 (×2): 30 mg via ORAL
  Filled 2022-09-18 (×2): qty 1

## 2022-09-18 MED ORDER — INSULIN ASPART 100 UNIT/ML IJ SOLN
20.0000 [IU] | Freq: Once | INTRAMUSCULAR | Status: AC
  Administered 2022-09-18: 20 [IU] via SUBCUTANEOUS

## 2022-09-18 MED ORDER — HALOPERIDOL LACTATE 5 MG/ML IJ SOLN
1.0000 mg | Freq: Once | INTRAMUSCULAR | Status: AC
  Administered 2022-09-18: 1 mg via INTRAVENOUS
  Filled 2022-09-18: qty 1

## 2022-09-18 MED ORDER — DULOXETINE HCL 60 MG PO CPEP
60.0000 mg | ORAL_CAPSULE | Freq: Every morning | ORAL | Status: DC
  Administered 2022-09-18 – 2022-09-19 (×2): 60 mg via ORAL
  Filled 2022-09-18: qty 2
  Filled 2022-09-18: qty 1

## 2022-09-18 NOTE — ED Notes (Signed)
Neurology at bedside.

## 2022-09-18 NOTE — Progress Notes (Signed)
Patient confused and refusing to listen when staff asks patient to stop getting up and unplugging EEG box. Patient swinging equipment at staff and grabbing staff, getting aggressive and confrontational.  Orders for Haldol and restraints received, Haldol given, security called and present for placement of bilateral wrist restraints. Re educated patient with security on necessity for restraints and necessity for EEG.

## 2022-09-18 NOTE — ED Notes (Signed)
Received a call from pt's wife stating that she noticed that pt has missed 3 days worth of his medications and wanted to care team to be made aware.

## 2022-09-18 NOTE — Discharge Instructions (Signed)
Discussed [place] DMV statutes, patients with seizures are not allowed to drive until they have been seizure-free for six months Use caution when using heavy equipment or power tools. Avoid working on ladders or at heights. Take showers instead of baths. Ensure the water temperature is not too high on the home water heater. Do not go swimming alone. Do not lock yourself in a room alone (i.e. bathroom). When caring for infants or small children, sit down when holding, feeding, or changing them to minimize risk of injury to the child in the event you have a seizure. Maintain good sleep hygiene. Avoid alcohol.   

## 2022-09-18 NOTE — Progress Notes (Signed)
Patient continue to be confused, although alert, but unable to follow direction or answer any question appropriately. Patient w/ bil wrists restraints, but able to remove them somehow, trying to get out of bed and swinging at staffs. He declined all oral medications and refused for his VS to be taken. Daughter at bedside. MD paged for assistant, order received.

## 2022-09-18 NOTE — Evaluation (Signed)
Physical Therapy Evaluation Patient Details Name: Fernando Hess MRN: [id number] DOB: 13-Aug-1946 Today's Date: 09/18/2022  History of Present Illness  77 y.o. male admitted with confusion and some difficulty with gait.  MRI with small focus of hyperintense signal on diffusion-weighted imaging in the right occipital lobe is new compared to prior MRI in November. 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  Clinical Impression  Patient presents with mobility not too far from his baseline today needing help due to recent application of EEG lines.  He was able to walk to gym and negotiate steps with rails with S.  He seems, however, more confused than earlier today with OT.  Some concern if wife can safely supervise at home.  Hopeful he will continue to improve and return home to resume HHPT, but if cognition does not clear, may need to consider SNF.        Recommendations for follow up therapy are one component of a multi-disciplinary discharge planning process, led by the attending physician.  Recommendations may be updated based on patient status, additional functional criteria and insurance authorization.  Follow Up Recommendations Home health PT (to resume)      Assistance Recommended at Discharge Frequent or constant Supervision/Assistance  Patient can return home with the following  Help with stairs or ramp for entrance;Assistance with cooking/housework    Equipment Recommendations None recommended by PT  Recommendations for Other Services       Functional Status Assessment Patient has had a recent decline in their functional status and demonstrates the ability to make significant improvements in function in a reasonable and predictable amount of time.     Precautions / Restrictions Precautions Precautions: Fall Precaution Comments: two falls over past  3 weeks since d/c      Mobility  Bed Mobility Overal bed mobility: Needs Assistance Bed Mobility: Supine to Sit, Sit to Supine     Supine to sit: Supervision Sit to supine: Supervision   General bed mobility comments: assist for lines (EEG and portable telemetry)    Transfers Overall transfer level: Needs assistance Equipment used: Rolling walker (2 wheels) Transfers: Sit to/from Stand Sit to Stand: Supervision           General transfer comment: assist for safety with lines    Ambulation/Gait Ambulation/Gait assistance: Supervision Gait Distance (Feet): 150 Feet Assistive device: Rolling walker (2 wheels) Gait Pattern/deviations: Step-through pattern, Decreased stride length       General Gait Details: good safe use of rolling walker, states likes his "four wheeler" at home  Stairs Stairs: Yes Stairs assistance: Supervision Stair Management: One rail Right, Two rails, Alternating pattern, Forwards Number of Stairs: 3 General stair comments: S for safety and cues due to deeper steps on one side  Wheelchair Mobility    Modified Rankin (Stroke Patients Only) Modified Rankin (Stroke Patients Only) Pre-Morbid Rankin Score: Slight disability Modified Rankin: Moderately severe disability     Balance Overall balance assessment: Needs assistance   Sitting balance-Leahy Scale: Good     Standing balance support: No upper extremity supported, Bilateral upper extremity supported Standing balance-Leahy Scale: Fair Standing balance comment: static balance without LOB without UE support, used RW for ambulation                             Pertinent Vitals/Pain Pain Assessment Pain Assessment: No/denies pain  Home Living Family/patient expects to be discharged to:: Private residence Living Arrangements: Spouse/significant other Available Help at Discharge: Family;Available 24 hours/day Type of Home: House Home Access: Stairs to enter Entrance  Stairs-Rails: Left Entrance Stairs-Number of Steps: 5   Home Layout: Two level;Able to live on main level with bedroom/bathroom Home Equipment: Rollator (4 wheels);Shower seat;Grab bars - tub/shower;BSC/3in1      Prior Function Prior Level of Function : Needs assist             Mobility Comments: uses rollator at home, frequent falls at baseline has fallen a "couple" times since last admission 3 weeks ago without injury, just soreness       Hand Dominance   Dominant Hand: Right    Extremity/Trunk Assessment   Upper Extremity Assessment Upper Extremity Assessment: Defer to OT evaluation    Lower Extremity Assessment Lower Extremity Assessment: Overall WFL for tasks assessed    Cervical / Trunk Assessment Cervical / Trunk Assessment: Kyphotic  Communication   Communication: No difficulties  Cognition Arousal/Alertness: Awake/alert Behavior During Therapy: WFL for tasks assessed/performed Overall Cognitive Status: Impaired/Different from baseline Area of Impairment: Orientation, Attention, Safety/judgement, Problem solving                 Orientation Level: Disoriented to, Place, Time Current Attention Level: Sustained     Safety/Judgement: Decreased awareness of safety, Decreased awareness of deficits   Problem Solving: Slow processing General Comments: confused initially thinking he was home and looking for "Inez Catalina" his sister in law; slightly impulsive trying to get up just after EEG applied and tech still in the room        General Comments General comments (skin integrity, edema, etc.): wearing glasses, just had been set up on continuous EEG, tech approved disconnecting for ambulation, reconnected cord after ambulating    Exercises     Assessment/Plan    PT Assessment Patient needs continued PT services  PT Problem List Decreased strength;Decreased mobility;Decreased balance;Decreased knowledge of use of DME;Decreased cognition       PT  Treatment Interventions DME instruction;Balance training;Gait training;Stair training;Functional mobility training;Patient/family education;Therapeutic activities;Therapeutic exercise;Cognitive remediation    PT Goals (Current goals can be found in the Care Plan section)  Acute Rehab PT Goals Patient Stated Goal: to go home PT Goal Formulation: With patient Time For Goal Achievement: 10/02/22 Potential to Achieve Goals: Good    Frequency Min 3X/week     Co-evaluation               AM-PAC PT "6 Clicks" Mobility  Outcome Measure Help needed turning from your back to your side while in a flat bed without using bedrails?: None Help needed moving from lying on your back to sitting on the side of a flat bed without using bedrails?: A Little Help needed moving to and from a bed to a chair (including a wheelchair)?: A Little Help needed standing up from a chair using your arms (e.g., wheelchair or bedside chair)?: None Help needed to walk in hospital room?: A Little Help needed climbing 3-5 steps with a railing? : A Little 6 Click Score: 20    End of Session Equipment Utilized During Treatment: Gait belt Activity Tolerance: Patient tolerated treatment well Patient left: in bed;with call bell/phone within reach;with bed alarm set   PT Visit Diagnosis: Other symptoms and signs involving the nervous system (R29.898);Other abnormalities of gait and mobility (R26.89)    Time: 1430-1456 PT Time Calculation (min) (ACUTE ONLY): 26 min   Charges:  PT Evaluation $PT Eval Moderate Complexity: 1 Mod PT Treatments $Gait Training: 8-22 mins        Magda Kiel, PT Acute Rehabilitation Services Office:[phone] 09/18/2022   Reginia Naas 09/18/2022, 5:30 PM

## 2022-09-18 NOTE — Evaluation (Signed)
Occupational Therapy Evaluation Patient Details Name: Fernando Hess MRN: [id number] DOB: 17-Oct-1945 Today's Date: 09/18/2022   History of Present Illness 77 y.o. male admitted with confusion and some difficulty with gait.  MRI with small focus of hyperintense signal on diffusion-weighted imaging in the right occipital lobe is new compared to prior MRI in November. 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   Clinical Impression   Pt currently min guard for most selfcare tasks sit to stand and for transfers to and from the elevated toilet without AD.  Decreased memory which was likely present to some extent PTA but no family present.  Not able to recall any of 3 words given after 5 min delay.  Some decreased problem solving also noted with pt thinking the light behind the bed was a window and blinds as well as accidentally flushing his washcloth instead of giving it back to therapist after completion of toilet hygiene.  Feel pt will benefit from acute care OT at this time to help progress back to modified independent level.        Recommendations for follow up therapy are one component of a multi-disciplinary discharge planning process, led by the attending physician.  Recommendations may be updated based on patient status, additional functional criteria and insurance authorization.   Follow Up Recommendations  No OT follow up     Assistance Recommended at Discharge PRN  Patient can return home with the following Assist for transportation;Direct supervision/assist for medications management;Help with stairs or ramp for entrance    Functional Status Assessment  Patient has had a recent decline in their functional status and demonstrates the ability to make significant improvements in function in a reasonable and predictable amount of time.  Equipment  Recommendations  None recommended by OT       Precautions / Restrictions Precautions Precautions: Fall Restrictions Weight Bearing Restrictions: No      Mobility Bed Mobility Overal bed mobility: Needs Assistance Bed Mobility: Supine to Sit       Sit to supine: Min guard        Transfers Overall transfer level: Needs assistance Equipment used: None Transfers: Sit to/from Stand, Bed to chair/wheelchair/BSC Sit to Stand: Min guard     Step pivot transfers: Min guard     General transfer comment: One slight LOB when standing in preparation for therapist to straighten the sheet, but did not need therapist physical intervention to correct.      Balance Overall balance assessment: Needs assistance   Sitting balance-Leahy Scale: Fair Sitting balance - Comments: LOB to the right when donning gripper socks   Standing balance support: No upper extremity supported Standing balance-Leahy Scale: Fair Standing balance comment: Pt able to stand statically and reach to the floor without LOB X1.  LOB with turning noted                           ADL either performed or assessed with clinical judgement   ADL Overall ADL's : Needs assistance/impaired Eating/Feeding: Independent;Sitting   Grooming: Wash/dry hands;Wash/dry face;Supervision/safety;Standing   Upper Body Bathing: Set up;Sitting Upper Body Bathing Details (indicate cue type and reason): simulated Lower Body Bathing: Sit to/from stand;Min guard Lower Body Bathing Details (indicate cue type and reason): simulated Upper Body Dressing : Sitting;Set up   Lower Body Dressing: Minimal assistance;Sit to/from stand Lower Body Dressing Details (indicate cue  type and reason): Pt with LOB to the right when crossing LEs to donn gripper socks. Toilet Transfer: Min guard;Ambulation;Regular Museum/gallery exhibitions officer and Hygiene: Min guard;Sit to/from stand   Tub/ Shower Transfer: Min  guard;Ambulation;Shower Scientist, research (medical) Details (indicate cue type and reason): simulated stepping over the edge of the tub Functional mobility during ADLs: Min guard (no device in the room, short distance) General ADL Comments: Pt currently at min guard assist for transfers in the room.  He reports using his rollator at home and sometimes his cane.  He has a BSC and shower seat.     Vision Baseline Vision/History: 1 Wears glasses (did not have them present) Ability to See in Adequate Light: 0 Adequate Patient Visual Report: No change from baseline Vision Assessment?: No apparent visual deficits     Perception  WFLs   Praxis  WFLs    Pertinent Vitals/Pain Pain Assessment Pain Assessment: No/denies pain     Hand Dominance Right   Extremity/Trunk Assessment Upper Extremity Assessment Upper Extremity Assessment: Overall WFL for tasks assessed   Lower Extremity Assessment Lower Extremity Assessment: Defer to PT evaluation   Cervical / Trunk Assessment Cervical / Trunk Assessment: Kyphotic   Communication Communication Communication: No difficulties   Cognition Arousal/Alertness: Awake/alert Behavior During Therapy: WFL for tasks assessed/performed Overall Cognitive Status: Impaired/Different from baseline                                 General Comments: Pt with 0/3 word recall after approximately 5 min delay.  Some confusion noted with pt placing washcloth in the toilet and flushing it instead of giving it back to therapist.  He also thought the light in his room behind the bed was a window with blinds.  Pt aware of memory issues, but unsure if they are new.                Home Living Family/patient expects to be discharged to:: Private residence Living Arrangements: Spouse/significant other;Children Available Help at Discharge: Family;Available 24 hours/day Type of Home: House Home Access: Stairs to enter [employer] of Steps:  5 Entrance Stairs-Rails: Left Home Layout: Two level;Able to live on main level with bedroom/bathroom     Bathroom Shower/Tub: Tub/shower unit   Bathroom Toilet: Handicapped height     Home Equipment: Rollator (4 wheels);Shower seat;Grab bars - tub/shower;BSC/3in1          Prior Functioning/Environment Prior Level of Function : Needs assist             Mobility Comments: uses rollator at home, frequent falls at baseline has fallen a "couple" times since last admission 3 weeks ago without injury, just soreness ADLs Comments: states has been helping his wife        OT Problem List: Impaired balance (sitting and/or standing);Decreased cognition;Decreased strength      OT Treatment/Interventions: Self-care/ADL training;Patient/family education;Balance training;Therapeutic activities;Neuromuscular education;DME and/or AE instruction;Cognitive remediation/compensation    OT Goals(Current goals can be found in the care plan section) Acute Rehab OT Goals Patient Stated Goal: Pt did not state but agreeable to participation in therapy. OT Goal Formulation: With patient Time For Goal Achievement: 10/02/22 Potential to Achieve Goals: Good  OT Frequency: Min 2X/week       AM-PAC OT "6 Clicks" Daily Activity     Outcome Measure Help from another person eating meals?: None Help from another person taking care of personal  grooming?: A Little Help from another person toileting, which includes using toliet, bedpan, or urinal?: A Little Help from another person bathing (including washing, rinsing, drying)?: A Little Help from another person to put on and taking off regular upper body clothing?: None Help from another person to put on and taking off regular lower body clothing?: A Little 6 Click Score: 20   End of Session Nurse Communication: Mobility status  Activity Tolerance: Patient tolerated treatment well Patient left: in bed;with call bell/phone within reach  OT Visit  Diagnosis: Unsteadiness on feet (R26.81);Other symptoms and signs involving cognitive function;Repeated falls (R29.6)                Time: 3462-1947 OT Time Calculation (min): 26 min Charges:  OT General Charges $OT Visit: 1 Visit OT Evaluation $OT Eval Moderate Complexity: 1 Mod OT Treatments $Self Care/Home Management : 8-22 mins  Brnadon Eoff OTR/L 09/18/2022, 11:03 AM

## 2022-09-18 NOTE — ED Notes (Signed)
Pt called RN in asking to speak to the Dr.  I asked pt which dr he would like to speak to.  Pt became quite rude and verbally aggressive stating "the one that was just in here".  I explained to pt, I did not know which dr was just in here and pt told me to leave his room.

## 2022-09-18 NOTE — Evaluation (Signed)
Speech Language Pathology Evaluation Patient Details Name: Fernando Hess MRN: [id number] DOB: 05/05/1946 Today's Date: 09/18/2022 Time: 7371-0626 SLP Time Calculation (min) (ACUTE ONLY): 13 min  Problem List:  Patient Active Problem List   Diagnosis Date Noted   Acute encephalopathy 09/17/2022   AKI (acute kidney injury) ([place]) 09/04/2022   Generalized weakness 08/12/2022   Myasthenia gravis ([place]) 04/21/2022   Chronic kidney disease, stage 3b ([place]) 03/21/2022   Posterior vitreous detachment of both eyes 01/23/2022   Constipation 11/13/2021   Diabetes mellitus, type 2 ([place]) 11/13/2021   Severe major depression, single episode, without psychotic features ([place]) 11/13/2021   Amaurosis fugax 11/13/2021   Amnesia 11/13/2021   Anxiety 11/13/2021   Cardiac arrhythmia 11/13/2021   Hearing loss 11/13/2021   Hypercoagulable state ([place]) 11/13/2021   Hyperparathyroidism due to renal insufficiency ([place]) 11/13/2021   Mild aortic stenosis 11/05/2021   Paroxysmal atrial fibrillation ([place]) 11/05/2021   Nasal septal deviation 08/30/2021   Anticoagulated by anticoagulation treatment 07/16/2021   Syncope 05/10/2021   Gastro-esophageal reflux disease without esophagitis 07/25/2020   Globus pharyngeus 07/25/2020   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 ([place]) 12/21/2019   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   Chronic heart failure with preserved ejection fraction (HFpEF) ([place]) 11/18/2019   OSA on CPAP 03/07/2019   Peripheral artery disease    Intractable vascular headache 11/11/2018   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   Coronary artery disease involving native coronary artery of native heart without angina pectoris 09/24/2015   Moderate nonproliferative diabetic retinopathy of left eye with macular edema associated with type 2 diabetes mellitus ([place]) 03/01/2008   Hyperlipidemia LDL goal <70    Essential hypertension    History of kidney stones    Past Medical  History:  Past Medical History:  Diagnosis Date   Acute renal failure (ARF) ([place]) 09/24/2015   Acute stress disorder 11/13/2021   Atrial premature complexes    CAD (coronary artery disease) of artery bypass graft    Early occlusion of saphenous vein graft to intermediate and marginal branch in February 2007 following bypass grafting    CAD (coronary artery disease), native coronary artery 2017   hx NSTEMI 09-24-2015  s/p  CABG x5 on 10-02-2015;  post op STEMI inferolateral wall,  SVG OM1 and SVG OM2 occluded, distal OM occlusion the calpruit, treated medically // Myoview 7/21: no ischemia, EF 65, low risk   CHF (congestive heart failure) ([place])    CKD (chronic kidney disease), stage III ([place])    Contusion of right knee 03/16/2020   Dyspnea    Elevated troponin    Erectile dysfunction    Esophageal reflux    History of atrial fibrillation    post op CABG 10-02-2015   History of kidney stones    History of non-ST elevation myocardial infarction (NSTEMI) 09/24/2015   s/p  CABG x5   History of ST elevation myocardial infarction (STEMI) 10/22/2015   inferior wall,  post op CABG 10-02-2015   Hyperlipidemia    Hypertension    Left ureteral stone    Mild atherosclerosis of both carotid arteries    Nephrolithiasis    per CT bilateral non-obstructive calculi   OSA (obstructive sleep apnea)    Peripheral artery disease    LE Arterial US 01/2019: R PTA and ATA occluded; L ATA occluded   RBBB (right bundle branch block)    Renal atrophy, right    Sleep apnea    wears cpap  ST elevation myocardial infarction (STEMI) of inferior wall ([place]) 10/22/2015   Type 2 diabetes mellitus treated with insulin ([place])    followed by pcp   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 ([place]) 03/01/2008   Wears glasses    Past Surgical History:  Past Surgical History:  Procedure Laterality Date   [place] N/A 09/26/2015   Procedure: Left Heart Cath and Coronary Angiography;  Surgeon: Troy Sine, MD;  Location: [facility];  Service: Cardiovascular;  Laterality: N/A;   CARDIAC CATHETERIZATION N/A 10/22/2015   Procedure: Left Heart Cath and Coronary Angiography;  Surgeon: Sherren Mocha, MD;  Location: [facility];  Service: Cardiovascular;  Laterality: N/A;   CATARACT EXTRACTION W/ INTRAOCULAR LENS  IMPLANT, BILATERAL  2017   COLONOSCOPY     CORONARY ARTERY BYPASS GRAFT N/A 10/02/2015   Procedure: CORONARY ARTERY BYPASS GRAFTING (CABG) X5 LIMA-LAD; SVG-DIAG; SVG-OM; SVG-PD; SVG-RAMUS TRANSESOPHAGEAL ECHOCARDIOGRAM (TEE) ENDOSCOPIC GREATER SAPHENOUS VEIN  HARVEST BILAT LE;  Surgeon: Ivin Poot, MD;  Location: [facility];  Service: Open Heart Surgery;  Laterality: N/A;   CYSTOSCOPY/URETEROSCOPY/HOLMIUM LASER/STENT PLACEMENT Left 08/10/2018   Procedure: CYSTOSCOPY/URETEROSCOPY/HOLMIUM LASER/STENT PLACEMENT;  Surgeon: Festus Aloe, MD;  Location: [facility];  Service: Urology;  Laterality: Left;   CYSTOSCOPY/URETEROSCOPY/HOLMIUM LASER/STENT PLACEMENT Left 09/10/2018   Procedure: CYSTOSCOPY/URETEROSCOPY/HOLMIUM LASER/STENT EXCHANGE;  Surgeon: Festus Aloe, MD;  Location: [facility];  Service: Urology;  Laterality: Left;   LEFT HEART CATHETERIZATION WITH CORONARY ANGIOGRAM N/A 04/13/2014   Procedure: LEFT HEART CATHETERIZATION WITH CORONARY ANGIOGRAM;  Surgeon: Jacolyn Reedy, MD;  Location: [facility];  Service: Cardiovascular;  Laterality: N/A;   LEG SURGERY Right age 81   closed reduction leg fracture   NASAL SEPTOPLASTY W/ TURBINOPLASTY Bilateral 08/30/2021   Procedure: NASAL SEPTOPLASTY WITH BILATERAL INFERIOR TURBINATE REDUCTION;  Surgeon: Jerrell Belfast, MD;  Location: [facility];  Service: ENT;  Laterality: Bilateral;   POLYPECTOMY     RIGHT HEART CATH N/A 06/06/2021   Procedure: RIGHT HEART CATH;  Surgeon: Jolaine Artist, MD;  Location: [facility];  Service: Cardiovascular;  Laterality: N/A;   TEE WITHOUT CARDIOVERSION N/A 10/02/2015   Procedure: TRANSESOPHAGEAL ECHOCARDIOGRAM (TEE);  Surgeon: Ivin Poot, MD;  Location: [facility];  Service: Open Heart Surgery;  Laterality: N/A;   URETEROSCOPY WITH HOLMIUM LASER LITHOTRIPSY Bilateral 2004;  2005  dr grapey  '@[facility]'$    VASECTOMY     HPI:  77 y.o. male who presented as a code stroke. MRI 1/3 with subacute infarcts: "Small focus of hyperintense signal on diffusion-weighted imaging in the right occipital lobe is new compared to prior MRI brain dated  08/12/2022, but without a definite correlate on the ADC map. There is also contrast enhancement in this region, suggestive of a subacute infarct. Additional focus of contrast enhancement in the medial aspect of the right occipital lobe, which is also favored to represent an additional site of a subacute infarct. Recommend repeat contrast enhanced brain MRI in 4 weeks to assess for interval change." Per chart review, wife describes memory decline over past 3 years. 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   Assessment / Plan / Recommendation Clinical Impression  Pt presents with significant cognitive assessments as assessed informally.  Pt with tangential responses to questions and social greetings.  Pt was oriented to person and year with minimal cuing. Pt disoriented to current situation and location. Pt oriented to state, but did not know he was in hospital.  He believed staff at nursing desk was exercising outsite of his room.  Pt disoriented to time of day (3 AM).  Pt partially oriented to situation.  Pt states that 5 fireman and his neighbor had to get him out of the car because it wouldn't start.  He thinks it is time to get a new car.  Pt answered 2 orientation questions correctly for SLUMS.   With administration of 3rd item (calculation) pt became agitated.  He wanted to do something else.  SLP attempted to redirect patient to new task.  He became increasingly agitated and wanted to leave AMA.  Pt got up out of bed.  Formal evaluation was discontinued at this time 2/2 agitation. Pt was encouraged to sit on end of bed and continued to talk to SLP while staff went to find nurse. Pt stated he has 3 children aged 98, 56, and 56.  Pt states his wife is 71 years old.  He expressed marital dissatisfaction.    Pt appears to need frequent supervision at this time.  SLP will continue to attempt further evaluaiton of cognitive-linguistic ability as pt is agreeable.    SLP Assessment  SLP Recommendation/Assessment: Patient needs continued Speech [place] [facility] SLP Visit Diagnosis: Cognitive communication deficit (R41.841)    Recommendations for follow up therapy are one component of a multi-disciplinary discharge planning process, led by the attending physician.  Recommendations may be updated based on patient status, additional functional criteria and insurance authorization.    Follow Up Recommendations   (TBD)    Assistance Recommended at Discharge  Frequent or constant Supervision/Assistance  Functional Status Assessment Patient has had a recent decline in their functional status and demonstrates the ability to make significant improvements in function in a reasonable and predictable amount of time.  Frequency and Duration min 1 x/week  2 weeks      SLP Evaluation Cognition  Overall Cognitive Status: Difficult to assess Orientation Level: Oriented to person       Comprehension  Auditory Comprehension Overall Auditory Comprehension:  (Unable to assess)    Expression Expression Primary Mode of Expression: Verbal Written Expression Dominant Hand: Right   Oral / Motor  Motor Speech Overall Motor Speech:  (Unable to assess)            Celedonio Savage, [place],  [place] Office: [phone] 09/18/2022, 11:42 AM

## 2022-09-18 NOTE — Inpatient Diabetes Management (Addendum)
Inpatient Diabetes Program Recommendations  AACE/ADA: New Consensus Statement on Inpatient Glycemic Control (2015)  Target Ranges:  Prepandial:   less than 140 mg/dL      Peak postprandial:   less than 180 mg/dL (1-2 hours)      Critically ill patients:  140 - 180 mg/dL   Lab Results  Component Value Date   GLUCAP 415 (H) 09/18/2022   HGBA1C 14.6 (H) 08/13/2022    Review of Glycemic Control  Latest Reference Range & Units 09/17/22 22:04 09/18/22 08:53  Glucose-Capillary 70 - 99 mg/dL 110 (H) 415 (H)   Diabetes history: DM 2 Outpatient Diabetes medications: Basaglar 40 units qam, Humalog 8 units tid meal coverage Current orders for Inpatient glycemic control:  Semglee 20 units Daily Novolog 0-15 units tid + hs  A1c 14.6% on 11/29  Note basal insulin ordered yesterday as Daily, however, never given. Glucose 400's this am. Pt scheduled to get Semglee 20 units. Pt may need 1/2 of home meal coverage ordered if trends increase after meal intake.  Will watch trends for now.   Addendum 1:49 pm: Spoke with pt at bedside. Pt reportedly takes Basaglar 40 units and then pt reports taking an additional 30 units on top of that in a separate injection. Pt also reports taking Humalog 8 units tid meal coverage. Pt states when checking glucose trends at home that his levels are between 200-400 range. Discussed current A1c level of 14.6%. Reviewed glucose and A1c goals. Pt sees Dr. Harrington Challenger, PCP, for DM management and sees them every 3 months. Pt reports drinking coke zero and eating fairly good however he reports not doing very well and eating homemade cakes for the holidays. Pt wears a Freestyle Libre for glucose checks, however, I learn pt wears this device on his abd as well. Discussed that the [facility] device is only approved for the back of the arms due to accuracy. Stressed importance of glucose control.   Thanks,  Tama Headings RN, MSN, BC-ADM Inpatient Diabetes Coordinator Team Pager [phone]  (8a-5p)

## 2022-09-18 NOTE — Progress Notes (Addendum)
Neurology Progress Note  Brief HPI: 77 y.o. male with a past medical history of CAD, afib on eliquis, CHF, CKD, GERD, CABG, HLD, HTN, OSA, DM2 who presents with altered mental status. He went to a doctors appointment with his wife and was walking slow/unsteady and speaking softly during the appointment. When they got home he pulled up to the mailbox and couldn't figure out how to open it, he then couldn't figure out how to shift the car back into drive and was trying to use the heat control to shift the car. His wife tells me that home health has not been coming to the house for the last couple of weeks but he did take all of his medications this morning. He has been having episodes of diarrhea for the last couple of days. He was supposed to get blood work done on Tuesday and he did not eat on Monday because he "wanted his blood sugar to be low."  On arrival BP is 151/83 and glucose is 80.    Subjective: Patient seen in room sitting on the side of his stretcher eating breakfast  Notably not agitated overnight per nursing Additional history obtained from wife about his recent cognitive status.  She does describe a decline over the past 3 years in his memory.  She notes that they typically do not drive far only to doctors appointments of but 6 months ago he was very confused trying to navigate to a known doctor's appointment location which prompted installation of GPS in the car.  He also gets agitated when he cannot remember things.  However he has never had an episode of being nonverbal before.  No clear seizure activity including no episodes of unexplained incontinence or tongue biting.  Additionally he requires help for bathing and gets agitated with some of the home health staff, specifically with regards to genital care, I am unable to ascertain what his difficulty with bathing is and wife is not able to clearly explain why he requires help with bathing but still drives.  Exam: Vitals:   09/18/22 0600  09/18/22 0606  BP:    Pulse: 93   Resp: (!) 23   Temp:  98.1 F (36.7 C)  SpO2: 99%    Gen: In bed, NAD Resp: non-labored breathing, no acute distress Abd: soft, nt  Neuro: Mental Status: AAox4.Mild paraphasic errors at times for example calling my flashlight a clock Speech is clear. Short term memory deficit, cannot recall what his goal blood glucose should be despite multiple repetitions (initially reports 60-80, and then 80-100 when told 80-120) Cranial Nerves: II: Visual Fields are full. PERRL.   III,IV, VI: EOMI without ptosis or diploplia.  VII: slight right facial droop at rest, but slight left facial droop with activation VIII: Hearing is intact to voice X: Palate elevates symmetrically XI: Shoulder shrug is symmetric. XII: Tongue protrudes midline without atrophy or fasciculations.  Motor: 5/5 deltoids, 4+/5 left hip flexion, 5/5 right hip flexion Sensory: Sensation is symmetric to light touch in the arms and legs.  DTRs: 2+ and symmetric in the brachioradialis and patellae, notably not hyperreflexic today Cerebellar: Intact finger-nose bilaterally   Pertinent Labs: LDL 66 Ha1C- pending  Cr 2.01   Imaging Reviewed:  MRI- 1. Small focus of hyperintense signal on diffusion-weighted imaging in the right occipital lobe is new compared to prior MRI brain dated 08/12/2022, but without a definite correlate on the ADC map. There is also contrast enhancement in this region, suggestive of a subacute  infarct. Additional focus of contrast enhancement in the medial aspect of the right occipital lobe, which is also favored to represent an additional site of a subacute infarct. Recommend repeat contrast enhanced brain MRI in 4 weeks to assess for interval change. 2. Asymmetric contrast enhancement of the mastoid segment of the right facial nerve, which is nonspecific but can be seen in the setting of Bell's palsy.  EEG 11/29 This study is within normal limits. No  seizures or epileptiform discharges were seen throughout the recording.   EEG 1/04 -Intermittent slow, right > left posterior quadrant  This study is suggestive of cortical dysfunction arising from right more than left posterior quadrant, nonspecific etiology. No seizures or epileptiform discharges were seen throughout the recording. Please note that lack of epileptiform activity does not exclude the diagnosis of epilepsy.   Assessment:  77 y.o. male with a past medical history of CAD, afib on eliquis, CHF, CKD, GERD, CABG, HLD, HTN, OSA, DM2 who presents with altered mental status. He went to a doctors appointment with his wife and was walking slow/unsteady and speaking softly during the appointment. When they got home he pulled up to the mailbox and couldn't figure out how to open it, he then couldn't figure out how to shift the car back into drive and was trying to use the heat control to shift the car, subsequently had right-sided weakness and global aphasia. On arrival BP is 151/83 and glucose is 80.    Overall the story is concerning for possible focal seizure with impaired awareness, especially given MRI is negative for stroke in the left MCA distribution though interestingly it is positive for some subacute strokes in the right hemisphere which would not explain his symptoms.  Left MCA territory TIA remains a possibility, with the other findings being more related to a likely underlying dementia.  I do favor the MRI findings on the right side to be subacute strokes but agree short interval MRI is reasonable to exclude malignancy  While EEG is negative, given the description of the event and the possibility of focal seizures contributing to his memory impairments and general cognitive decline, I do feel like a trial of Keppra is reasonable to see if this improves his symptoms.  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   Recommendations: - Outpatient MRI brain in 4-8 weeks to follow-up incidentally found subacute strokes to confirm expected evolution and exclude malignancy - Trial of Keppra 250 mg twice daily given concern for focal seizure, - Outpatient sleep study and reinitiation of CPAP - SLP neurocognitive evaluation and detailed outpatient neurocognitive evaluation - Appreciate primary team addressing family's concern regarding home health - Unclear that the patient can safely manage his medications outpatient, and given this event he cannot drive for at least 6 months which was reviewed with both patient and wife via phone - Wife given number to schedule with [facility], patient has not followed up on last referral placed in late November - Please include seizure precautions below in discharge instructions  Addendum: On further review of chart, patient missed multiple days of medication at home and his mental status has continued to fluctuate (with glucose fluctuating as well).  Additionally EEG today did show some intermittent focal slowing.  As  patient is unlikely to be safely discharged today, will start on long-term EEG monitoring to try to clarify diagnosis and stop Keppra to increase sensitivity of this evaluation.  Neurology will continue to follow    Discussed [facility] statutes, patients with seizures are not allowed to drive until they have been seizure-free for six months Use caution when using heavy equipment or power tools. Avoid working on ladders or at heights. Take showers instead of baths. Ensure the water temperature is not too high on the home water heater. Do not go swimming alone. Do not lock yourself in a room alone (i.e. bathroom). When caring for infants or small children, sit down when holding,  feeding, or changing them to minimize risk of injury to the child in the event you have a seizure. Maintain good sleep hygiene. Avoid alcohol.    Patient seen and examined by NP/APP with MD. MD to update note as needed.   Janine Ores, DNP, FNP-BC [facility] Pager: [phone]  Attending Neurologist's note:  I personally saw this patient, gathering history, performing a full neurologic examination, reviewing relevant labs, personally reviewing relevant imaging including MRI brain, and formulated the assessment and plan, adding the note above for completeness and clarity to accurately reflect my thoughts  Greater than 50 minutes were spent in care of this patient today, the majority at bedside or in discussion with family

## 2022-09-18 NOTE — Progress Notes (Signed)
LTM EEG hooked up and running - no initial skin breakdown - push button tested - [facility] monitoring.  

## 2022-09-18 NOTE — Procedures (Signed)
Patient Name: Fernando Hess  MRN: [id number]  Epilepsy Attending: Lora Havens  Referring Physician/Provider: Marcelyn Bruins, MD  Date: 09/17/2022 Duration: 26.58 mins  Patient history: 77 year old male with an episode of abnormal gait and confusion.  EEG to evaluate for seizure.  Level of alertness: Awake  AEDs during EEG study: None  Technical aspects: This EEG study was done with scalp electrodes positioned according to the 10-20 International system of electrode placement. Electrical activity was reviewed with band pass filter of 1-'70Hz'$ , sensitivity of 7 uV/mm, display speed of 34m/sec with a '60Hz'$  notched filter applied as appropriate. EEG data were recorded continuously and digitally stored.  Video monitoring was available and reviewed as appropriate.  Description: The posterior dominant rhythm consists of 8 Hz activity of moderate voltage (25-35 uV) seen predominantly in posterior head regions, symmetric and reactive to eye opening and eye closing. Drowsiness was characterized by attenuation of the posterior background rhythm. Sleep was characterized by vertex waves, sleep spindles (12 to 14 Hz), maximal frontocentral region. EEG showed intermittent right > left posterior quadrant 2 to 3 Hz delta slowing. Hyperventilation did not show any EEG change.  Physiologic photic driving was not seen during photic stimulation.    ABNORMALITY -Intermittent slow, right > left posterior quadrant   IMPRESSION: This study is suggestive of cortical dysfunction arising from right more than left posterior quadrant, nonspecific etiology. No seizures or epileptiform discharges were seen throughout the recording.  Please note that lack of epileptiform activity does not exclude the diagnosis of epilepsy.  Adrien Shankar OBarbra Sarks

## 2022-09-18 NOTE — Progress Notes (Signed)
[facility] PROGRESS NOTE   Fernando Hess SJG:[id number] DOB: Apr 25, 1946 DOA: 09/17/2022  PCP: [name], MD  Brief History/Interval Summary:  77 y.o. 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 Wife noted that he had a slow and unsteady gait.  He could not figure out how to open his mailbox.  Could not figure out how to shift his car to drive up the driveway.  EMS was called.  Patient was brought into the hospital.   Consultants: Neurology  Procedures: EEG    Subjective/Interval History: Patient noted to be somewhat confused.  Mildly agitated.  Denies any chest pain or shortness of breath.  No nausea or vomiting.  No headaches.  No visual disturbances.  Denies any weakness on any 1 side.    Assessment/Plan:  Acute metabolic encephalopathy/concern for seizure activity/subacute stroke Patient seen by neurology.  MRI of the brain raised concern for subacute stroke. Patient underwent EEG.  Seen by neurology again this morning.  They have started him on Keppra. Noted to be mildly agitated.  No neurological deficits noted this morning.  PT and OT evaluation is pending.  Cognitive evaluation is pending. Neurology recommends repeating MRI in about 4 weeks. Patient on apixaban.  Noted to be on daily aspirin as well.  Not recommended by neurology.  Will discontinue. Echocardiogram not thought to be necessary by neurology and they have discontinued the order. LDL is 66.  Continue statin. HbA1c was 14.6 in November. TSH normal.  M54 and folic acid levels were normal when checked recently.  History of coronary artery disease He is status post CABG. Continue home medications.  Chronic kidney disease stage IIIb Renal function is stable.  Monitor urine output.  Avoid nephrotoxic agents.  Diabetes  mellitus type 2, uncontrolled with hyperglycemia HbA1c 14.6 in November.  CBGs are poorly controlled.  He is noted to be on glargine and SSI.  At home patient is on Basaglar 40 units every morning along with lispro 8 units 3 times a day.  Chronic diastolic CHF Last echocardiogram in 2022 showed a EF of 60 to 65% with grade 1 diastolic dysfunction. Lasix is on hold.  Paroxysmal atrial fibrillation Continue Eliquis.  Not noted to be on any rate limiting drugs.  Essential hypertension Apparently has had issues with orthostatic hypotension recently hence the reason he is not on any antihypertensives currently.  GERD Continue PPI  History of anxiety and depression Should be able to resume duloxetine.  Peripheral artery disease Stable.  Obstructive sleep apnea Not on CPAP.  This needs to be addressed in the outpatient setting  DVT Prophylaxis: On apixaban Code Status: Full code Family Communication: No family at bedside Disposition Plan: To be determined  Status is: Observation The patient will require care spanning > 2 midnights and should be moved to inpatient because: Acute encephalopathy    Medications: Scheduled:   stroke: early stages of recovery book   Does not apply Once   apixaban  5 mg Oral BID   aspirin EC  81 mg Oral Daily   atorvastatin  80 mg Oral QPM   haloperidol lactate  1 mg Intravenous Once   insulin aspart  0-15 Units Subcutaneous TID WC   insulin aspart  0-5 Units Subcutaneous QHS   insulin glargine-yfgn  20 Units Subcutaneous Daily   levETIRAcetam  250 mg Oral BID  pantoprazole  40 mg Oral q AM   sodium chloride flush  3 mL Intravenous Q12H   Continuous: AOZ:HYQMVHQIONGEX **OR** acetaminophen, polyethylene glycol  Antibiotics: Anti-infectives (From admission, onward)    None       Objective:  Vital Signs  Vitals:   09/18/22 0500 09/18/22 0600 09/18/22 0606 09/18/22 0821  BP: (!) 160/103   (!) 149/97  Pulse: 98 93  (!) 101  Resp: (!)  21 (!) 23  20  Temp:   98.1 F (36.7 C) 98.4 F (36.9 C)  TempSrc:   Oral Oral  SpO2: 100% 99%  96%  Weight:      Height:        Intake/Output Summary (Last 24 hours) at 09/18/2022 0923 Last data filed at 09/17/2022 1900 Gross per 24 hour  Intake 1000 ml  Output --  Net 1000 ml   Filed Weights   09/17/22 1100 09/17/22 1235  Weight: 80 kg 80 kg    General appearance: Awake alert.  In no distress.  Distracted Resp: Clear to auscultation bilaterally.  Normal effort Cardio: S1-S2 is normal regular.  No S3-S4.  No rubs murmurs or bruit GI: Abdomen is soft.  Nontender nondistended.  Bowel sounds are present normal.  No masses organomegaly Extremities: No edema.  Full range of motion of lower extremities. Neurologic:No focal neurological deficits.    Lab Results:  Data Reviewed: I have personally reviewed following labs and reports of the imaging studies  CBC: Recent Labs  Lab 09/17/22 1201 09/18/22 0023  WBC 13.6* 10.5  NEUTROABS 9.0*  --   HGB 14.2  14.3 12.1*  HCT 43.3  42.0 38.5*  MCV 93.3 97.2  PLT 251 528    Basic Metabolic Panel: Recent Labs  Lab 09/17/22 1201 09/18/22 0023  NA 144  143 141  K 4.0  4.0 3.6  CL 106  107 111  CO2 21* 23  GLUCOSE 78  79 167*  BUN '20  19 23  '$ CREATININE 1.99*  1.90* 2.01*  CALCIUM 9.1 8.1*    GFR: Estimated Creatinine Clearance: 30.1 mL/min (A) (by C-G formula based on SCr of 2.01 mg/dL (H)).  Liver Function Tests: Recent Labs  Lab 09/17/22 1201 09/18/22 0023  AST 35 27  ALT 29 26  ALKPHOS 131* 108  BILITOT 1.8* 1.0  PROT 5.8* 4.7*  ALBUMIN 3.0* 2.4*     Recent Labs  Lab 09/17/22 1541  AMMONIA 14    Coagulation Profile: Recent Labs  Lab 09/17/22 1201  INR 1.5*      BNP (last 3 results) Recent Labs    11/06/21 1031  PROBNP 661*    CBG: Recent Labs  Lab 09/17/22 1153 09/17/22 2204 09/18/22 0853  GLUCAP 80 110* 415*    Lipid Profile: Recent Labs    09/18/22 0023  CHOL 118   HDL 33*  LDLCALC 66  TRIG 97  CHOLHDL 3.6    Thyroid Function Tests: Recent Labs    09/17/22 1301  TSH 0.580     Recent Results (from the past 240 hour(s))  Resp panel by RT-PCR (RSV, Flu A&B, Covid) Anterior Nasal Swab     Status: None   Collection Time: 09/17/22  3:00 PM   Specimen: Anterior Nasal Swab  Result Value Ref Range Status   SARS Coronavirus 2 by RT PCR NEGATIVE NEGATIVE Final    Comment: (NOTE) SARS-CoV-2 target nucleic acids are NOT DETECTED.  The SARS-CoV-2 RNA is generally detectable in upper respiratory specimens during the  acute phase of infection. The lowest concentration of SARS-CoV-2 viral copies this assay can detect is 138 copies/mL. A negative result does not preclude SARS-Cov-2 infection and should not be used as the sole basis for treatment or other patient management decisions. A negative result may occur with  improper specimen collection/handling, submission of specimen other than nasopharyngeal swab, presence of viral mutation(s) within the areas targeted by this assay, and inadequate number of viral copies(<138 copies/mL). A negative result must be combined with clinical observations, patient history, and epidemiological information. The expected result is Negative.  Fact Sheet for Patients:  EntrepreneurPulse.com.au  Fact Sheet for Healthcare Providers:  IncredibleEmployment.be  This test is no t yet approved or cleared by the [place] FDA and  has been authorized for detection and/or diagnosis of SARS-CoV-2 by FDA under an Emergency Use Authorization (EUA). This EUA will remain  in effect (meaning this test can be used) for the duration of the COVID-19 declaration under Section 564(b)(1) of the Act, 21 U.S.C.section 360bbb-3(b)(1), unless the authorization is terminated  or revoked sooner.       Influenza A by PCR NEGATIVE NEGATIVE Final   Influenza B by PCR NEGATIVE NEGATIVE Final     Comment: (NOTE) The Xpert Xpress SARS-CoV-2/FLU/RSV plus assay is intended as an aid in the diagnosis of influenza from Nasopharyngeal swab specimens and should not be used as a sole basis for treatment. Nasal washings and aspirates are unacceptable for Xpert Xpress SARS-CoV-2/FLU/RSV testing.  Fact Sheet for Patients: EntrepreneurPulse.com.au  Fact Sheet for Healthcare Providers: IncredibleEmployment.be  This test is not yet approved or cleared by the [place] FDA and has been authorized for detection and/or diagnosis of SARS-CoV-2 by FDA under an Emergency Use Authorization (EUA). This EUA will remain in effect (meaning this test can be used) for the duration of the COVID-19 declaration under Section 564(b)(1) of the Act, 21 U.S.C. section 360bbb-3(b)(1), unless the authorization is terminated or revoked.     Resp Syncytial Virus by PCR NEGATIVE NEGATIVE Final    Comment: (NOTE) Fact Sheet for Patients: EntrepreneurPulse.com.au  Fact Sheet for Healthcare Providers: IncredibleEmployment.be  This test is not yet approved or cleared by the [place] FDA and has been authorized for detection and/or diagnosis of SARS-CoV-2 by FDA under an Emergency Use Authorization (EUA). This EUA will remain in effect (meaning this test can be used) for the duration of the COVID-19 declaration under Section 564(b)(1) of the Act, 21 U.S.C. section 360bbb-3(b)(1), unless the authorization is terminated or revoked.  Performed at [facility], [place] [address], [place], [place] [address]       Radiology Studies: EEG adult  Result Date: October 14, 2022 Lora Havens, MD     10/14/22  8:45 AM Patient Name: Fernando Hess MRN: [id number] Epilepsy Attending: Lora Havens Referring Physician/Provider: Marcelyn Bruins, MD Date: 09/17/2022 Duration: 26.58 mins Patient history: 77 year old male with an episode of  abnormal gait and confusion.  EEG to evaluate for seizure. Level of alertness: Awake AEDs during EEG study: None Technical aspects: This EEG study was done with scalp electrodes positioned according to the 10-20 International system of electrode placement. Electrical activity was reviewed with band pass filter of 1-'70Hz'$ , sensitivity of 7 uV/mm, display speed of 7m/sec with a '60Hz'$  notched filter applied as appropriate. EEG data were recorded continuously and digitally stored.  Video monitoring was available and reviewed as appropriate. Description: The posterior dominant rhythm consists of 8 Hz activity of moderate voltage (25-35 uV)  seen predominantly in posterior head regions, symmetric and reactive to eye opening and eye closing. Drowsiness was characterized by attenuation of the posterior background rhythm. Sleep was characterized by vertex waves, sleep spindles (12 to 14 Hz), maximal frontocentral region. EEG showed intermittent right > left posterior quadrant 2 to 3 Hz delta slowing. Hyperventilation did not show any EEG change.  Physiologic photic driving was not seen during photic stimulation.  ABNORMALITY -Intermittent slow, right > left posterior quadrant IMPRESSION: This study is suggestive of cortical dysfunction arising from right more than left posterior quadrant, nonspecific etiology. No seizures or epileptiform discharges were seen throughout the recording. Please note that lack of epileptiform activity does not exclude the diagnosis of epilepsy. Lora Havens   DG Chest Portable 1 View  Result Date: 09/17/2022 CLINICAL DATA:  Altered mental status. EXAM: PORTABLE CHEST 1 VIEW COMPARISON:  September 04, 2022. FINDINGS: Stable cardiomediastinal silhouette. Status post coronary bypass graft. Both lungs are clear. The visualized skeletal structures are unremarkable. IMPRESSION: No active disease. Electronically Signed   By: Marijo Conception M.D.   On: 09/17/2022 15:20   MR BRAIN W WO  CONTRAST  Result Date: 09/17/2022 CLINICAL DATA:  Stroke suspected EXAM: MRI HEAD WITHOUT AND WITH CONTRAST TECHNIQUE: Multiplanar, multiecho pulse sequences of the brain and surrounding structures were obtained without and with intravenous contrast. CONTRAST:  97m GADAVIST GADOBUTROL 1 MMOL/ML IV SOLN COMPARISON:  Same day CT head/neck angiogram.  MRI Head 08/12/22 FINDINGS: Brain: Small focus of hyperintense signal in the right occipital lobe on diffusion-weighted imaging (series 3, image 21). This is new compared to prior MRI brain dated 08/12/2022, but without a definite correlate on the ADC map. There is also contrast enhancement in this region (series 14, image 21), which is suggestive of a subacute infarct. Sequela of moderate chronic microvascular ischemic change. No evidence of hemorrhage. No hydrocephalus. No extra-axial fluid collection. There is asymmetric contrast enhancement of the mastoid segment of the right facial nerve (series 14, image 10). There is an additional focus of contrast enhancement in the medial aspect of the right occipital lobe (series 14, image 20), which is also favored to represent an additional site a subacute infarct. Vascular: V4 segment of the left vertebral artery demonstrates decreased flow signal. Please see separately dictated same day CTA head and neck angiogram for additional vascular findings. Skull and upper cervical spine: Normal marrow signal. Sinuses/Orbits: Bilateral lens replacement. Mucosal thickening bilateral maxillary, ethmoid, and frontal sinuses. Trace right mastoid effusion. Other: None IMPRESSION: 1. Small focus of hyperintense signal on diffusion-weighted imaging in the right occipital lobe is new compared to prior MRI brain dated 08/12/2022, but without a definite correlate on the ADC map. There is also contrast enhancement in this region, suggestive of a subacute infarct. Additional focus of contrast enhancement in the medial aspect of the right  occipital lobe, which is also favored to represent an additional site of a subacute infarct. Recommend repeat contrast enhanced brain MRI in 4 weeks to assess for interval change. 2. Asymmetric contrast enhancement of the mastoid segment of the right facial nerve, which is nonspecific but can be seen in the setting of Bell's palsy. Electronically Signed   By: HMarin RobertsM.D.   On: 09/17/2022 15:03   CT ANGIO HEAD NECK W WO CM (CODE STROKE)  Result Date: 09/17/2022 CLINICAL DATA:  Neuro deficit, acute, stroke suspected. EXAM: CT ANGIOGRAPHY HEAD AND NECK TECHNIQUE: Multidetector CT imaging of the head and neck was performed using the  standard protocol during bolus administration of intravenous contrast. Multiplanar CT image reconstructions and MIPs were obtained to evaluate the vascular anatomy. Carotid stenosis measurements (when applicable) are obtained utilizing NASCET criteria, using the distal internal carotid diameter as the denominator. RADIATION DOSE REDUCTION: This exam was performed according to the departmental dose-optimization program which includes automated exposure control, adjustment of the mA and/or kV according to patient size and/or use of iterative reconstruction technique. CONTRAST:  57m OMNIPAQUE IOHEXOL 350 MG/ML SOLN COMPARISON:  None Available. FINDINGS: CTA NECK FINDINGS Aortic arch: Normal variant 4 vessel aortic arch with the left vertebral artery arising directly from the arch. Minimal atherosclerosis without significant stenosis of the arch vessel origins. Right carotid system: Patent with mild calcified and soft plaque in the carotid bulb. No evidence of a significant stenosis or dissection. Left carotid system: Patent with a small to moderate amount of predominantly calcified plaque at the carotid bifurcation extending into the carotid bulb without significant associated stenosis. Additional soft plaque near the distal aspect of the carotid bulb results in 50% ICA stenosis.  Vertebral arteries: The vertebral arteries are patent in the neck with the right being strongly dominant. Scattered atherosclerotic plaque throughout the right vertebral artery results in up to moderate V1 and mild V3 stenosis. Skeleton: Grade 1 anterolisthesis of C3 on C4 and grade 1 retrolisthesis of C4 on C5. Asymmetrically advanced left facet arthrosis at C2-3 and C3-4. Other neck: No evidence of cervical lymphadenopathy or mass. Upper chest: Clear lung apices. Review of the MIP images confirms the above findings CTA HEAD FINDINGS Anterior circulation: The internal carotid arteries are patent from skull base to carotid termini with mild cavernous and paraclinoid stenoses bilaterally as well as a mild-to-moderate distal right petrous stenosis. ACAs and MCAs are patent without evidence of a proximal branch occlusion or significant proximal stenosis. No aneurysm is identified. Posterior circulation: The intracranial right vertebral artery is patent with moderate multifocal stenoses due to prominent atherosclerotic plaque. The intracranial left vertebral artery is small and patent proximally but is occluded distal to the PICA origin. The basilar artery is patent and congenitally small with mild diffuse atherosclerotic irregularity. There are large posterior communicating arteries bilaterally with asymmetrically prominent hypoplasia of the left P1 segment. Both PCAs are patent with mild atherosclerotic irregularity of the branch vessels but no evidence of a flow limiting proximal stenosis. No aneurysm is identified. Venous sinuses: As permitted by contrast timing, patent. Anatomic variants: Fetal left PCA. Review of the MIP images confirms the above findings These results were communicated to Dr. BCurly Shoresat 12:21 pm on 09/17/2022 by text page via the [facility] Ct[facility]aging system. IMPRESSION: 1. 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 2. Patent and dominant right vertebral artery with moderate proximal  and distal stenoses. 3. Mild-to-moderate intracranial ICA stenoses. 4. 50% proximal left ICA stenosis. 5.  Aortic Atherosclerosis (ICD10-I70.0). Electronically Signed   By: ALogan BoresM.D.   On: 09/17/2022 12:42   CT HEAD CODE STROKE WO CONTRAST  Result Date: 09/17/2022 CLINICAL DATA:  Code stroke. Neuro deficit, acute, stroke suspected. EXAM: CT HEAD WITHOUT CONTRAST TECHNIQUE: Contiguous axial images were obtained from the base of the skull through the vertex without intravenous contrast. RADIATION DOSE REDUCTION: This exam was performed according to the departmental dose-optimization program which includes automated exposure control, adjustment of the mA and/or kV according to patient size and/or use of iterative reconstruction technique. COMPARISON:  Head CT and MRI 08/12/2022 FINDINGS: Brain: There is no evidence of an acute  supratentorial infarct, intracranial hemorrhage, mass, midline shift, or extra-axial fluid collection. Motion and streak artifact limited assessment of the posterior fossa. There is mild cerebral atrophy. Hypodensities in the cerebral white matter bilaterally are unchanged and nonspecific but compatible with mild chronic small vessel ischemic disease. Vascular: Calcified atherosclerosis at the skull base. No hyperdense vessel. Skull: No fracture or suspicious osseous lesion is identified within limitations of motion artifact. Sinuses/Orbits: Mucous retention cysts in the maxillary sinuses. Clear mastoid air cells. Bilateral cataract extraction. Other: None. ASPECTS [facility] Stroke Program Early CT Score) - Ganglionic level infarction (caudate, lentiform nuclei, internal capsule, insula, M1-M3 cortex): 7 - Supraganglionic infarction (M4-M6 cortex): 3 Total score (0-10 with 10 being normal): 10 IMPRESSION: 1. No evidence of acute intracranial abnormality. Limited assessment of the posterior fossa. 2. ASPECTS of 10. 3. Mild chronic small vessel ischemic disease. These results were  communicated to Dr. Curly Shores at 12:16 pm on 09/17/2022 by text page via the [facility] messaging system. Electronically Signed   By: Logan Bores M.D.   On: 09/17/2022 12:22       LOS: 0 days   [facility] Pager on [email or web address]  09/18/2022, 9:23 AM

## 2022-09-18 NOTE — ED Notes (Signed)
Received notification that pt's wife called asking about pt's cell phone. This RN has attempted to find pt's cell phone multiple times throughout shift and have been unable to locate it. Called pt's cell phone to see if it would ring in pt's room with no luck

## 2022-09-18 NOTE — Progress Notes (Signed)
EEG complete - results pending 

## 2022-09-18 NOTE — ED Notes (Addendum)
Message sent to Dr. [place] Pink regarding pt's BGL of 415, according to insulin order, notify MD if over 400. Awaiting response.

## 2022-09-19 DIAGNOSIS — Z794 Long term (current) use of insulin: Secondary | ICD-10-CM | POA: Diagnosis not present

## 2022-09-19 DIAGNOSIS — G9340 Encephalopathy, unspecified: Secondary | ICD-10-CM | POA: Diagnosis not present

## 2022-09-19 DIAGNOSIS — E1165 Type 2 diabetes mellitus with hyperglycemia: Secondary | ICD-10-CM | POA: Diagnosis not present

## 2022-09-19 DIAGNOSIS — N1832 Chronic kidney disease, stage 3b: Secondary | ICD-10-CM | POA: Diagnosis not present

## 2022-09-19 DIAGNOSIS — R4182 Altered mental status, unspecified: Secondary | ICD-10-CM | POA: Diagnosis not present

## 2022-09-19 LAB — GLUCOSE, CAPILLARY
Glucose-Capillary: 147 mg/dL — ABNORMAL HIGH (ref 70–99)
Glucose-Capillary: 210 mg/dL — ABNORMAL HIGH (ref 70–99)
Glucose-Capillary: 242 mg/dL — ABNORMAL HIGH (ref 70–99)
Glucose-Capillary: 274 mg/dL — ABNORMAL HIGH (ref 70–99)
Glucose-Capillary: 89 mg/dL (ref 70–99)

## 2022-09-19 LAB — HEMOGLOBIN A1C
Hgb A1c MFr Bld: 10.7 % — ABNORMAL HIGH (ref 4.8–5.6)
Mean Plasma Glucose: 260 mg/dL

## 2022-09-19 MED ORDER — DULOXETINE HCL 60 MG PO CPEP
90.0000 mg | ORAL_CAPSULE | Freq: Every day | ORAL | Status: DC
  Administered 2022-09-20 – 2022-09-24 (×5): 90 mg via ORAL
  Filled 2022-09-19 (×5): qty 1

## 2022-09-19 MED ORDER — QUETIAPINE FUMARATE 50 MG PO TABS
50.0000 mg | ORAL_TABLET | Freq: Two times a day (BID) | ORAL | Status: DC
  Administered 2022-09-19 – 2022-09-24 (×10): 50 mg via ORAL
  Filled 2022-09-19 (×10): qty 1

## 2022-09-19 MED ORDER — LEVETIRACETAM 250 MG PO TABS
250.0000 mg | ORAL_TABLET | Freq: Two times a day (BID) | ORAL | Status: DC
  Administered 2022-09-19: 250 mg via ORAL
  Filled 2022-09-19: qty 1

## 2022-09-19 MED ORDER — HYDRALAZINE HCL 20 MG/ML IJ SOLN
10.0000 mg | INTRAMUSCULAR | Status: DC | PRN
  Administered 2022-09-19: 10 mg via INTRAVENOUS
  Filled 2022-09-19: qty 1

## 2022-09-19 MED ORDER — HALOPERIDOL LACTATE 5 MG/ML IJ SOLN: 1.0000 mg | Freq: Four times a day (QID) | INTRAMUSCULAR | Status: DC | PRN

## 2022-09-19 NOTE — Progress Notes (Signed)
   09/19/22 0316 09/19/22 0418  Vitals  Temp 97.9 F (36.6 C)  --   Temp Source Oral  --   BP (!) 214/106 (!) 158/81  MAP (mmHg) 133 104  BP Location Left Arm  --   BP Method Automatic  --   Patient Position (if appropriate) Lying  --   Pulse Rate 99 86  Pulse Rate Source Dinamap  --   ECG Heart Rate 98 82  Resp 16  --   Level of Consciousness  Level of Consciousness Alert  --   MEWS COLOR  MEWS Score Color Yellow Green

## 2022-09-19 NOTE — Progress Notes (Signed)
$'5mg'I$  IV valium wasted with Gareth Morgan, RN.

## 2022-09-19 NOTE — Inpatient Diabetes Management (Signed)
Inpatient Diabetes Program Recommendations  AACE/ADA: New Consensus Statement on Inpatient Glycemic Control (2015)  Target Ranges:  Prepandial:   less than 140 mg/dL      Peak postprandial:   less than 180 mg/dL (1-2 hours)      Critically ill patients:  140 - 180 mg/dL   Lab Results  Component Value Date   GLUCAP 210 (H) 09/19/2022   HGBA1C 10.7 (H) 09/18/2022    Review of Glycemic Control  Latest Reference Range & Units 09/18/22 12:03 09/18/22 16:50 09/18/22 21:09 09/19/22 03:25 09/19/22 06:02  Glucose-Capillary 70 - 99 mg/dL 272 (H) 163 (H) 193 (H) 147 (H) 210 (H)  (H): Data is abnormally high Diabetes history: DM 2 Outpatient Diabetes medications: Basaglar 40 units qam, Humalog 8 units tid meal coverage Current orders for Inpatient glycemic control:  Semglee 20 units Daily Novolog 0-15 units tid + hs  Inpatient Diabetes Program Recommendations:    Consider adding Novolog 3 units TID (Assuming patient is consuming >50% of meals).  Thanks, Bronson Curb, MSN, RNC-OB Diabetes Coordinator [phone] (8a-5p)

## 2022-09-19 NOTE — Progress Notes (Signed)
Neurology Progress Note  Brief HPI: 77 y.o. male with a past medical history of CAD, afib on eliquis, CHF, CKD, GERD, CABG, HLD, HTN, OSA, DM2 who presents with altered mental status. He went to a doctors appointment with his wife and was walking slow/unsteady and speaking softly during the appointment. When they got home he pulled up to the mailbox and couldn't figure out how to open it, he then couldn't figure out how to shift the car back into drive and was trying to use the heat control to shift the car. His wife tells me that home health has not been coming to the house for the last couple of weeks but he did take all of his medications this morning. He has been having episodes of diarrhea for the last couple of days. He was supposed to get blood work done on Tuesday and he did not eat on Monday because he "wanted his blood sugar to be low."  On arrival BP is 151/83 and glucose is 80.    Subjective/Interval: Patient seen in room with no family at the bedside.  He is in bilateral wrist restraints.  He is calm this morning, but states he had been agitated yesterday.  He has been hemodynamically stable and afebrile.  EEG reveals no seizure activity. Received valium 5 mg last evening Received keppra 250 this AM Refused full SLP cog eval  Exam: Current vital signs: BP (!) 155/77 (BP Location: Left Arm)   Pulse 91   Temp 97.9 F (36.6 C) (Oral)   Resp 20   Ht '5\' 6"'$  (1.676 m)   Wt 80 kg   SpO2 98%   BMI 28.47 kg/m  Vital signs in last 24 hours: Temp:  [97.8 F (36.6 C)-99.1 F (37.3 C)] 97.9 F (36.6 C) (01/05 0747) Pulse Rate:  [84-105] 91 (01/05 0747) Resp:  [16-20] 20 (01/05 0747) BP: (137-214)/(71-106) 155/77 (01/05 0747) SpO2:  [95 %-99 %] 98 % (01/05 0747)   Gen: In bed, NAD Resp: non-labored breathing, no acute distress  Neuro: Mental Status: Aaox2, states month is December Speech is clear with naming and repetition largely intact Cranial Nerves: II: Visual Fields are  full. PERRL.   III,IV, VI: EOMI without ptosis or diploplia.  VII: Face is symmetrical VIII: Hearing is intact to voice X: Palate elevates symmetrically XI: Shoulder shrug is symmetric. XII: Tongue protrudes midline without atrophy or fasciculations.  Motor: Good antigravity strength in all 4 extremities Sensory: Sensation is symmetric to light touch in the arms and legs.    Pertinent Labs: LDL 66 Ha1C-10.7 Cr 2.01   Imaging Reviewed:  MRI- 1. Small focus of hyperintense signal on diffusion-weighted imaging in the right occipital lobe is new compared to prior MRI brain dated 08/12/2022, but without a definite correlate on the ADC map. There is also contrast enhancement in this region, suggestive of a subacute infarct. Additional focus of contrast enhancement in the medial aspect of the right occipital lobe, which is also favored to represent an additional site of a subacute infarct. Recommend repeat contrast enhanced brain MRI in 4 weeks to assess for interval change. 2. Asymmetric contrast enhancement of the mastoid segment of the right facial nerve, which is nonspecific but can be seen in the setting of Bell's palsy.  EEG 11/29 This study is within normal limits. No seizures or epileptiform discharges were seen throughout the recording.   EEG 1/04 -Intermittent slow, right > left posterior quadrant  This study is suggestive of cortical dysfunction arising  from right more than left posterior quadrant, nonspecific etiology. No seizures or epileptiform discharges were seen throughout the recording. Please note that lack of epileptiform activity does not exclude the diagnosis of epilepsy.   Assessment:  77 y.o. male with a past medical history of CAD, afib on eliquis, CHF, CKD, GERD, CABG, HLD, HTN, OSA, DM2 who presents with altered mental status. He went to a doctors appointment with his wife and was walking slow/unsteady and speaking softly during the appointment. When  they got home he pulled up to the mailbox and couldn't figure out how to open it, he then couldn't figure out how to shift the car back into drive and was trying to use the heat control to shift the car, subsequently had right-sided weakness and global aphasia. On arrival BP is 151/83 and glucose is 80. Overall the story is concerning for possible focal seizure with impaired awareness, especially given MRI is negative for stroke in the left MCA distribution though interestingly it is positive for some subacute strokes in the right hemisphere which would not explain his symptoms.  Left MCA territory TIA remains a possibility, with the other findings being more related to a likely underlying dementia.  I do favor the MRI findings on the right side to be subacute strokes but agree short interval MRI is reasonable to exclude malignancy Of note his clinical examination is not consistent with Bell's palsy and I favor that MRI finding to be an artifact. However, this may all be dementia related cognitive fluctuation. LTM EEG to attempt to clarify diagnosis   Impression: Focal seizure with impaired awareness    Recommendations: - Outpatient MRI brain in 4-8 weeks to follow-up incidentally found subacute strokes to confirm expected evolution and exclude malignancy - Will observe on LTM EEG off of AEDs, please avoid benzos for agitation to improve diagnostic accuracy   Unchanged prior recs - Outpatient sleep study and reinitiation of CPAP - Wife given number to schedule with [facility], patient has not followed up on last referral placed in late November - Please include seizure precautions below in discharge instructions   Discussed [facility] statutes, patients with seizures are not allowed to drive until they have been seizure-free for six months Use caution when using heavy equipment or power tools. Avoid working on ladders or at heights. Take showers instead of baths. Ensure the  water temperature is not too high on the home water heater. Do not go swimming alone. Do not lock yourself in a room alone (i.e. bathroom). When caring for infants or small children, sit down when holding, feeding, or changing them to minimize risk of injury to the child in the event you have a seizure. Maintain good sleep hygiene. Avoid alcohol.    Patient seen and examined by NP/APP with MD. MD to update note as needed.   [place] , MSN, AGACNP-BC [facility] See Amion for schedule and pager information 09/19/2022 8:54 AM   Attending Neurologist's note:  I personally saw this patient, gathering history, performing a full neurologic examination, reviewing relevant labs, personally reviewing relevant imaging, and formulated the assessment and plan, adding the note above for completeness and clarity to accurately reflect my thoughts

## 2022-09-19 NOTE — TOC Initial Note (Signed)
Transition of Care [facility]) - Initial/Assessment Note    Patient Details  Name: Fernando Hess MRN: [id number] Date of Birth: May 24, 1946  [facility] CM/SW Contact:    Pollie Friar, RN Phone Number: 09/19/2022, 4:08 PM  Clinical Narrative:                 CM met with the patients son and daughter at the bedside. They state the patient and his spouse have been struggling at home. He has been assisting his wife at home. He has been doing the driving. Family unsure if taking medications correctly.  They are interested in short term rehab if he qualifies with potential LTC afterwards. They are in agreement with having [facility] contact them for assistance in the LT process.  Daughter and son state they can not provide the patient with the supervision or assistance he needs at home. Wife also stating she can not manage his needs at home. TOC following to see if he will qualify for SNF.  Expected Discharge Plan: Skilled Nursing Facility Barriers to Discharge: Continued Medical Work up   Patient Goals and CMS Choice   CMS Medicare.gov Compare Post Acute Care list provided to:: Patient Represenative (must comment) Choice offered to / list presented to : Adult Children, Spouse      Expected Discharge Plan and Services     Post Acute Care Choice: [place] Living arrangements for the past 2 months: Single Family Home                                      Prior Living Arrangements/Services Living arrangements for the past 2 months: Single Family Home Lives with:: Spouse                   Activities of Daily Living Home Assistive Devices/Equipment: Environmental consultant (specify type), Cane (specify quad or straight) (4 wheel walker, both types of canes.) ADL Screening (condition at time of admission) Patient's cognitive ability adequate to safely complete daily activities?: Yes Is the patient deaf or have difficulty hearing?: No Does the patient have  difficulty seeing, even when wearing glasses/contacts?: Yes Does the patient have difficulty concentrating, remembering, or making decisions?: No Patient able to express need for assistance with ADLs?: Yes Does the patient have difficulty dressing or bathing?: No Independently performs ADLs?: Yes (appropriate for developmental age) Does the patient have difficulty walking or climbing stairs?: No Weakness of Legs: None Weakness of Arms/Hands: None  Permission Sought/Granted                  Emotional Assessment              Admission diagnosis:  Acute encephalopathy [G93.40] Altered mental status, unspecified altered mental status type [R41.82] Cerebrovascular accident (CVA), unspecified mechanism ([place]) [Y19.5] Acute metabolic encephalopathy [K93.26] Patient Active Problem List   Diagnosis Date Noted   Acute metabolic encephalopathy 71/24/5809   Acute encephalopathy 09/17/2022   AKI (acute kidney injury) ([place]) 09/04/2022   Generalized weakness 08/12/2022   Myasthenia gravis ([place]) 04/21/2022   Chronic kidney disease, stage 3b ([place]) 03/21/2022   Posterior vitreous detachment of both eyes 01/23/2022   Constipation 11/13/2021   Diabetes mellitus, type 2 ([place]) 11/13/2021   Severe major depression, single episode, without psychotic features ([place]) 11/13/2021   Amaurosis fugax 11/13/2021   Amnesia 11/13/2021   Anxiety 11/13/2021  Cardiac arrhythmia 11/13/2021   Hearing loss 11/13/2021   Hypercoagulable state ([place]) 11/13/2021   Hyperparathyroidism due to renal insufficiency ([place]) 11/13/2021   Mild aortic stenosis 11/05/2021   Paroxysmal atrial fibrillation ([place]) 11/05/2021   Nasal septal deviation 08/30/2021   Anticoagulated by anticoagulation treatment 07/16/2021   Syncope 05/10/2021   Gastro-esophageal reflux disease without esophagitis 07/25/2020   Globus pharyngeus 07/25/2020   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 ([place])  12/21/2019   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   Chronic heart failure with preserved ejection fraction (HFpEF) ([place]) 11/18/2019   OSA on CPAP 03/07/2019   Peripheral artery disease    Intractable vascular headache 11/11/2018   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 [place] [address] Phone: [phone] Fax: [phone]     Social Determinants of Health (SDOH) Social History: SDOH Screenings   Food Insecurity: No Food Insecurity (09/18/2022)  Housing: Low Risk  (09/18/2022)  Transportation Needs: No Transportation Needs (09/18/2022)  Utilities: Not At Risk (09/18/2022)  Depression (PHQ2-9): Low Risk  (05/20/2019)  Tobacco Use: Low Risk  (09/17/2022)   SDOH Interventions:     Readmission Risk Interventions     No data to display

## 2022-09-19 NOTE — Procedures (Signed)
Patient Name: Fernando Hess  MRN: [id number]  Epilepsy Attending: Lora Havens  Referring Physician/Provider: Lorenza Chick, MD  Duration: 09/18/2022 1405 to 09/19/2022 1405  Patient history: 77 year old male with an episode of abnormal gait and confusion. EEG to evaluate for seizure.   Level of alertness: Awake, asleep  AEDs during EEG study: LEV  Technical aspects: This EEG study was done with scalp electrodes positioned according to the 10-20 International system of electrode placement. Electrical activity was reviewed with band pass filter of 1-'70Hz'$ , sensitivity of 7 uV/mm, display speed of 67m/sec with a '60Hz'$  notched filter applied as appropriate. EEG data were recorded continuously and digitally stored.  Video monitoring was available and reviewed as appropriate.  Description: The posterior dominant rhythm consists of 8 Hz activity of moderate voltage (25-35 uV) seen predominantly in posterior head regions, symmetric and reactive to eye opening and eye closing. Sleep was characterized by vertex waves, sleep spindles (12 to 14 Hz), maximal frontocentral region. Hyperventilation and photic stimulation were not performed.     IMPRESSION: This study is within normal limits. No seizures or epileptiform discharges were seen throughout the recording.  A normal interictal EEG does not exclude the diagnosis of epilepsy.  Sion Reinders OBarbra Sarks

## 2022-09-19 NOTE — Progress Notes (Signed)
  X-cover Note: Repeated calls over last 2 nights regarding pt's agitation. PDMP reviewed. Appears that pt on nightly [name]. Pt given 5 mg IV valium(national shortage of IV ativan) due to pt refusing po meds. RN reports improved levels of agitation.   Kristopher Oppenheim, DO [facility]

## 2022-09-19 NOTE — Plan of Care (Signed)
  Problem: Education: Goal: Knowledge of disease or condition will improve Outcome: Not Progressing   Problem: Health Behavior/Discharge Planning: Goal: Ability to manage health-related needs will improve Outcome: Not Progressing Goal: Goals will be collaboratively established with patient/family Outcome: Not Progressing   Problem: Health Behavior/Discharge Planning: Goal: Goals will be collaboratively established with patient/family Outcome: Not Progressing   Problem: Self-Care: Goal: Ability to participate in self-care as condition permits will improve Outcome: Not Progressing   Problem: Tissue Perfusion: Goal: Adequacy of tissue perfusion will improve Outcome: Not Progressing

## 2022-09-19 NOTE — Progress Notes (Signed)
[facility] PROGRESS NOTE   Fernando Hess SHF:[id number] DOB: 10/25/1945 DOA: 09/17/2022  PCP: [name], MD  Brief History/Interval Summary:  77 y.o. 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 Wife noted that he had a slow and unsteady gait.  He could not figure out how to open his mailbox.  Could not figure out how to shift his car to drive up the driveway.  EMS was called.  Patient was brought into the hospital.   Consultants: Neurology  Procedures: LTM EEG    Subjective/Interval History: Overnight events noted.  Patient was given 5 mg of Valium for persistent agitation.  He had to be restrained as well.  Noted to be confused and distracted this morning.  Denies any pain issues.     Assessment/Plan:  Acute metabolic encephalopathy/concern for seizure activity/subacute stroke Patient seen by neurology.  MRI of the brain raised concern for subacute stroke. Patient remains on long-term monitoring.  Neurology is following.  Keppra is currently on hold. Increased dose of Seroquel due to persistent agitation.  Haldol as needed.  Will check EKG. Neurology recommends repeating MRI in about 4 weeks. Patient on apixaban.   Echocardiogram not thought to be necessary by neurology and they have discontinued the order. LDL is 66.  Continue statin. HbA1c was 14.6 in November. TSH normal.  F02 and folic acid levels were normal when checked recently. PT and OT evaluation.  Home health is recommended.  Wife unable to provide supervision and assistance at home due to her own health problems.  History of coronary artery disease He is status post CABG. Continue home medications. Cardiac status is stable.  Chronic kidney disease stage IIIb Renal function is stable.  Monitor urine output.  Avoid nephrotoxic  agents. Recheck labs tomorrow.  Diabetes mellitus type 2, uncontrolled with hyperglycemia HbA1c 14.6 in November.  He is noted to be on glargine and SSI.  Continue to monitor CBGs.  Better controlled today compared to yesterday. At home patient is on Basaglar 40 units every morning along with lispro 8 units 3 times a day.  Chronic diastolic CHF Last echocardiogram in 2022 showed a EF of 60 to 65% with grade 1 diastolic dysfunction. Lasix is on hold.  Paroxysmal atrial fibrillation Continue Eliquis.  Not noted to be on any rate limiting drugs.  Essential hypertension Apparently has had issues with orthostatic hypotension recently hence the reason he is not on any antihypertensives currently. Noted to have significantly elevated blood pressures overnight.  This is likely due to agitation.  Use as needed agents for now.  GERD Continue PPI  History of anxiety and depression Continue duloxetine.  Peripheral artery disease Stable.  Obstructive sleep apnea Not on CPAP.  This needs to be addressed in the outpatient setting  DVT Prophylaxis: On apixaban Code Status: Full code Family Communication: No family at bedside.  Wife was updated yesterday. Disposition Plan: To be determined  Status is: Inpatient Remains inpatient appropriate because: Persistent agitation/delirium     Medications: Scheduled:  apixaban  5 mg Oral BID   atorvastatin  80 mg Oral QPM   DULoxetine  90 mg Oral Daily   insulin aspart  0-15 Units Subcutaneous TID WC   insulin aspart  0-5 Units Subcutaneous QHS   insulin glargine-yfgn  20 Units Subcutaneous Daily   pantoprazole  40 mg Oral q AM  QUEtiapine  50 mg Oral BID   sodium chloride flush  3 mL Intravenous Q12H   Continuous: TDS:[id number] **OR** acetaminophen, haloperidol lactate, hydrALAZINE, polyethylene glycol  Antibiotics: Anti-infectives (From admission, onward)    None       Objective:  Vital Signs  Vitals:   09/18/22 2330  09/19/22 0316 09/19/22 0418 09/19/22 0747  BP: (!) 162/92 (!) 214/106 (!) 158/81 (!) 155/77  Pulse: 97 99 86 91  Resp: '18 16  20  '$ Temp: 97.9 F (36.6 C) 97.9 F (36.6 C)  97.9 F (36.6 C)  TempSrc: Oral Oral  Oral  SpO2: 97% 98%  98%  Weight:      Height:        Intake/Output Summary (Last 24 hours) at 09/19/2022 1027 Last data filed at 09/19/2022 0640 Gross per 24 hour  Intake 120 ml  Output 850 ml  Net -730 ml    Filed Weights   09/17/22 1100 09/17/22 1235  Weight: 80 kg 80 kg   General appearance: Remains confused.  Agitated Resp: Clear to auscultation bilaterally.  Normal effort Cardio: S1-S2 is normal regular.  No S3-S4.  No rubs murmurs or bruit GI: Abdomen is soft.  Nontender nondistended.  Bowel sounds are present normal.  No masses organomegaly Extremities: No edema.  Moving all of his extremities  Neurologic: No obvious focal neurological deficits.   Lab Results:  Data Reviewed: I have personally reviewed following labs and reports of the imaging studies  CBC: Recent Labs  Lab 09/17/22 1201 09/18/22 0023  WBC 13.6* 10.5  NEUTROABS 9.0*  --   HGB 14.2  14.3 12.1*  HCT 43.3  42.0 38.5*  MCV 93.3 97.2  PLT 251 220     Basic Metabolic Panel: Recent Labs  Lab 09/17/22 1201 09/18/22 0023  NA 144  143 141  K 4.0  4.0 3.6  CL 106  107 111  CO2 21* 23  GLUCOSE 78  79 167*  BUN '20  19 23  '$ CREATININE 1.99*  1.90* 2.01*  CALCIUM 9.1 8.1*     GFR: Estimated Creatinine Clearance: 30.1 mL/min (A) (by C-G formula based on SCr of 2.01 mg/dL (H)).  Liver Function Tests: Recent Labs  Lab 09/17/22 1201 09/18/22 0023  AST 35 27  ALT 29 26  ALKPHOS 131* 108  BILITOT 1.8* 1.0  PROT 5.8* 4.7*  ALBUMIN 3.0* 2.4*      Recent Labs  Lab 09/17/22 1541  AMMONIA 14     Coagulation Profile: Recent Labs  Lab 09/17/22 1201  INR 1.5*       BNP (last 3 results) Recent Labs    11/06/21 1031  PROBNP 661*     CBG: Recent Labs   Lab 09/18/22 1203 09/18/22 1650 09/18/22 2109 09/19/22 0325 09/19/22 0602  GLUCAP 272* 163* 193* 147* 210*     Lipid Profile: Recent Labs    09/18/22 0023  CHOL 118  HDL 33*  LDLCALC 66  TRIG 97  CHOLHDL 3.6     Thyroid Function Tests: Recent Labs    09/17/22 1301  TSH 0.580      Recent Results (from the past 240 hour(s))  Resp panel by RT-PCR (RSV, Flu A&B, Covid) Anterior Nasal Swab     Status: None   Collection Time: 09/17/22  3:00 PM   Specimen: Anterior Nasal Swab  Result Value Ref Range Status   SARS Coronavirus 2 by RT PCR NEGATIVE NEGATIVE Final    Comment: (NOTE) SARS-CoV-2 target nucleic acids  are NOT DETECTED.  The SARS-CoV-2 RNA is generally detectable in upper respiratory specimens during the acute phase of infection. The lowest concentration of SARS-CoV-2 viral copies this assay can detect is 138 copies/mL. A negative result does not preclude SARS-Cov-2 infection and should not be used as the sole basis for treatment or other patient management decisions. A negative result may occur with  improper specimen collection/handling, submission of specimen other than nasopharyngeal swab, presence of viral mutation(s) within the areas targeted by this assay, and inadequate number of viral copies(<138 copies/mL). A negative result must be combined with clinical observations, patient history, and epidemiological information. The expected result is Negative.  Fact Sheet for Patients:  EntrepreneurPulse.com.au  Fact Sheet for Healthcare Providers:  IncredibleEmployment.be  This test is no t yet approved or cleared by the [place] FDA and  has been authorized for detection and/or diagnosis of SARS-CoV-2 by FDA under an Emergency Use Authorization (EUA). This EUA will remain  in effect (meaning this test can be used) for the duration of the COVID-19 declaration under Section 564(b)(1) of the Act,  21 U.S.C.section 360bbb-3(b)(1), unless the authorization is terminated  or revoked sooner.       Influenza A by PCR NEGATIVE NEGATIVE Final   Influenza B by PCR NEGATIVE NEGATIVE Final    Comment: (NOTE) The Xpert Xpress SARS-CoV-2/FLU/RSV plus assay is intended as an aid in the diagnosis of influenza from Nasopharyngeal swab specimens and should not be used as a sole basis for treatment. Nasal washings and aspirates are unacceptable for Xpert Xpress SARS-CoV-2/FLU/RSV testing.  Fact Sheet for Patients: EntrepreneurPulse.com.au  Fact Sheet for Healthcare Providers: IncredibleEmployment.be  This test is not yet approved or cleared by the [place] FDA and has been authorized for detection and/or diagnosis of SARS-CoV-2 by FDA under an Emergency Use Authorization (EUA). This EUA will remain in effect (meaning this test can be used) for the duration of the COVID-19 declaration under Section 564(b)(1) of the Act, 21 U.S.C. section 360bbb-3(b)(1), unless the authorization is terminated or revoked.     Resp Syncytial Virus by PCR NEGATIVE NEGATIVE Final    Comment: (NOTE) Fact Sheet for Patients: EntrepreneurPulse.com.au  Fact Sheet for Healthcare Providers: IncredibleEmployment.be  This test is not yet approved or cleared by the [place] FDA and has been authorized for detection and/or diagnosis of SARS-CoV-2 by FDA under an Emergency Use Authorization (EUA). This EUA will remain in effect (meaning this test can be used) for the duration of the COVID-19 declaration under Section 564(b)(1) of the Act, 21 U.S.C. section 360bbb-3(b)(1), unless the authorization is terminated or revoked.  Performed at [facility], [place] [address], [place], [place] [address]       Radiology Studies: Overnight EEG with video  Result Date: 09/19/2022 Lora Havens, MD     09/19/2022  9:11 AM Patient Name:  Fernando Hess MRN: [id number] Epilepsy Attending: Lora Havens Referring Physician/Provider: Lorenza Chick, MD Duration: 09/18/2022 1405 to 09/19/2022 0900 Patient history: 77 year old male with an episode of abnormal gait and confusion. EEG to evaluate for seizure. Level of alertness: Awake, asleep AEDs during EEG study: LEV Technical aspects: This EEG study was done with scalp electrodes positioned according to the 10-20 International system of electrode placement. Electrical activity was reviewed with band pass filter of 1-'70Hz'$ , sensitivity of 7 uV/mm, display speed of 79m/sec with a '60Hz'$  notched filter applied as appropriate. EEG data were recorded continuously and digitally stored.  Video monitoring was available  and reviewed as appropriate. Description: The posterior dominant rhythm consists of 8 Hz activity of moderate voltage (25-35 uV) seen predominantly in posterior head regions, symmetric and reactive to eye opening and eye closing. Sleep was characterized by vertex waves, sleep spindles (12 to 14 Hz), maximal frontocentral region. Hyperventilation and photic stimulation were not performed.   IMPRESSION: This study is within normal limits. No seizures or epileptiform discharges were seen throughout the recording. A normal interictal EEG does not exclude the diagnosis of epilepsy. Lora Havens   EEG adult  Result Date: 09/18/2022 Lora Havens, MD     09/18/2022  8:45 AM Patient Name: JAQUON GINGERICH MRN: [id number] Epilepsy Attending: Lora Havens Referring Physician/Provider: Marcelyn Bruins, MD Date: 09/17/2022 Duration: 26.58 mins Patient history: 77 year old male with an episode of abnormal gait and confusion.  EEG to evaluate for seizure. Level of alertness: Awake AEDs during EEG study: None Technical aspects: This EEG study was done with scalp electrodes positioned according to the 10-20 International system of electrode placement. Electrical activity was reviewed with band pass  filter of 1-'70Hz'$ , sensitivity of 7 uV/mm, display speed of 73m/sec with a '60Hz'$  notched filter applied as appropriate. EEG data were recorded continuously and digitally stored.  Video monitoring was available and reviewed as appropriate. Description: The posterior dominant rhythm consists of 8 Hz activity of moderate voltage (25-35 uV) seen predominantly in posterior head regions, symmetric and reactive to eye opening and eye closing. Drowsiness was characterized by attenuation of the posterior background rhythm. Sleep was characterized by vertex waves, sleep spindles (12 to 14 Hz), maximal frontocentral region. EEG showed intermittent right > left posterior quadrant 2 to 3 Hz delta slowing. Hyperventilation did not show any EEG change.  Physiologic photic driving was not seen during photic stimulation.  ABNORMALITY -Intermittent slow, right > left posterior quadrant IMPRESSION: This study is suggestive of cortical dysfunction arising from right more than left posterior quadrant, nonspecific etiology. No seizures or epileptiform discharges were seen throughout the recording. Please note that lack of epileptiform activity does not exclude the diagnosis of epilepsy. PLora Havens  DG Chest Portable 1 View  Result Date: 09/17/2022 CLINICAL DATA:  Altered mental status. EXAM: PORTABLE CHEST 1 VIEW COMPARISON:  September 04, 2022. FINDINGS: Stable cardiomediastinal silhouette. Status post coronary bypass graft. Both lungs are clear. The visualized skeletal structures are unremarkable. IMPRESSION: No active disease. Electronically Signed   By: JMarijo ConceptionM.D.   On: 09/17/2022 15:20   MR BRAIN W WO CONTRAST  Result Date: 09/17/2022 CLINICAL DATA:  Stroke suspected EXAM: MRI HEAD WITHOUT AND WITH CONTRAST TECHNIQUE: Multiplanar, multiecho pulse sequences of the brain and surrounding structures were obtained without and with intravenous contrast. CONTRAST:  835mGADAVIST GADOBUTROL 1 MMOL/ML IV SOLN COMPARISON:   Same day CT head/neck angiogram.  MRI Head 08/12/22 FINDINGS: Brain: Small focus of hyperintense signal in the right occipital lobe on diffusion-weighted imaging (series 3, image 21). This is new compared to prior MRI brain dated 08/12/2022, but without a definite correlate on the ADC map. There is also contrast enhancement in this region (series 14, image 21), which is suggestive of a subacute infarct. Sequela of moderate chronic microvascular ischemic change. No evidence of hemorrhage. No hydrocephalus. No extra-axial fluid collection. There is asymmetric contrast enhancement of the mastoid segment of the right facial nerve (series 14, image 10). There is an additional focus of contrast enhancement in the medial aspect of the right occipital lobe (series  14, image 20), which is also favored to represent an additional site a subacute infarct. Vascular: V4 segment of the left vertebral artery demonstrates decreased flow signal. Please see separately dictated same day CTA head and neck angiogram for additional vascular findings. Skull and upper cervical spine: Normal marrow signal. Sinuses/Orbits: Bilateral lens replacement. Mucosal thickening bilateral maxillary, ethmoid, and frontal sinuses. Trace right mastoid effusion. Other: None IMPRESSION: 1. Small focus of hyperintense signal on diffusion-weighted imaging in the right occipital lobe is new compared to prior MRI brain dated 08/12/2022, but without a definite correlate on the ADC map. There is also contrast enhancement in this region, suggestive of a subacute infarct. Additional focus of contrast enhancement in the medial aspect of the right occipital lobe, which is also favored to represent an additional site of a subacute infarct. Recommend repeat contrast enhanced brain MRI in 4 weeks to assess for interval change. 2. Asymmetric contrast enhancement of the mastoid segment of the right facial nerve, which is nonspecific but can be seen in the setting of  Bell's palsy. Electronically Signed   By: Marin Roberts M.D.   On: 09/17/2022 15:03   CT ANGIO HEAD NECK W WO CM (CODE STROKE)  Result Date: 09/17/2022 CLINICAL DATA:  Neuro deficit, acute, stroke suspected. EXAM: CT ANGIOGRAPHY HEAD AND NECK TECHNIQUE: Multidetector CT imaging of the head and neck was performed using the standard protocol during bolus administration of intravenous contrast. Multiplanar CT image reconstructions and MIPs were obtained to evaluate the vascular anatomy. Carotid stenosis measurements (when applicable) are obtained utilizing NASCET criteria, using the distal internal carotid diameter as the denominator. RADIATION DOSE REDUCTION: This exam was performed according to the departmental dose-optimization program which includes automated exposure control, adjustment of the mA and/or kV according to patient size and/or use of iterative reconstruction technique. CONTRAST:  55m OMNIPAQUE IOHEXOL 350 MG/ML SOLN COMPARISON:  None Available. FINDINGS: CTA NECK FINDINGS Aortic arch: Normal variant 4 vessel aortic arch with the left vertebral artery arising directly from the arch. Minimal atherosclerosis without significant stenosis of the arch vessel origins. Right carotid system: Patent with mild calcified and soft plaque in the carotid bulb. No evidence of a significant stenosis or dissection. Left carotid system: Patent with a small to moderate amount of predominantly calcified plaque at the carotid bifurcation extending into the carotid bulb without significant associated stenosis. Additional soft plaque near the distal aspect of the carotid bulb results in 50% ICA stenosis. Vertebral arteries: The vertebral arteries are patent in the neck with the right being strongly dominant. Scattered atherosclerotic plaque throughout the right vertebral artery results in up to moderate V1 and mild V3 stenosis. Skeleton: Grade 1 anterolisthesis of C3 on C4 and grade 1 retrolisthesis of C4 on C5.  Asymmetrically advanced left facet arthrosis at C2-3 and C3-4. Other neck: No evidence of cervical lymphadenopathy or mass. Upper chest: Clear lung apices. Review of the MIP images confirms the above findings CTA HEAD FINDINGS Anterior circulation: The internal carotid arteries are patent from skull base to carotid termini with mild cavernous and paraclinoid stenoses bilaterally as well as a mild-to-moderate distal right petrous stenosis. ACAs and MCAs are patent without evidence of a proximal branch occlusion or significant proximal stenosis. No aneurysm is identified. Posterior circulation: The intracranial right vertebral artery is patent with moderate multifocal stenoses due to prominent atherosclerotic plaque. The intracranial left vertebral artery is small and patent proximally but is occluded distal to the PICA origin. The basilar artery is patent and congenitally  small with mild diffuse atherosclerotic irregularity. There are large posterior communicating arteries bilaterally with asymmetrically prominent hypoplasia of the left P1 segment. Both PCAs are patent with mild atherosclerotic irregularity of the branch vessels but no evidence of a flow limiting proximal stenosis. No aneurysm is identified. Venous sinuses: As permitted by contrast timing, patent. Anatomic variants: Fetal left PCA. Review of the MIP images confirms the above findings These results were communicated to Dr. Curly Shores at 12:21 pm on 09/17/2022 by text page via the [facility] messaging system. IMPRESSION: 1. 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 2. Patent and dominant right vertebral artery with moderate proximal and distal stenoses. 3. Mild-to-moderate intracranial ICA stenoses. 4. 50% proximal left ICA stenosis. 5.  Aortic Atherosclerosis (ICD10-I70.0). Electronically Signed   By: Logan Bores M.D.   On: 09/17/2022 12:42   CT HEAD CODE STROKE WO CONTRAST  Result Date: 09/17/2022 CLINICAL DATA:  Code stroke. Neuro  deficit, acute, stroke suspected. EXAM: CT HEAD WITHOUT CONTRAST TECHNIQUE: Contiguous axial images were obtained from the base of the skull through the vertex without intravenous contrast. RADIATION DOSE REDUCTION: This exam was performed according to the departmental dose-optimization program which includes automated exposure control, adjustment of the mA and/or kV according to patient size and/or use of iterative reconstruction technique. COMPARISON:  Head CT and MRI 08/12/2022 FINDINGS: Brain: There is no evidence of an acute supratentorial infarct, intracranial hemorrhage, mass, midline shift, or extra-axial fluid collection. Motion and streak artifact limited assessment of the posterior fossa. There is mild cerebral atrophy. Hypodensities in the cerebral white matter bilaterally are unchanged and nonspecific but compatible with mild chronic small vessel ischemic disease. Vascular: Calcified atherosclerosis at the skull base. No hyperdense vessel. Skull: No fracture or suspicious osseous lesion is identified within limitations of motion artifact. Sinuses/Orbits: Mucous retention cysts in the maxillary sinuses. Clear mastoid air cells. Bilateral cataract extraction. Other: None. ASPECTS [facility] Stroke Program Early CT Score) - Ganglionic level infarction (caudate, lentiform nuclei, internal capsule, insula, M1-M3 cortex): 7 - Supraganglionic infarction (M4-M6 cortex): 3 Total score (0-10 with 10 being normal): 10 IMPRESSION: 1. No evidence of acute intracranial abnormality. Limited assessment of the posterior fossa. 2. ASPECTS of 10. 3. Mild chronic small vessel ischemic disease. These results were communicated to Dr. Curly Shores at 12:16 pm on 09/17/2022 by text page via the [facility] messaging system. Electronically Signed   By: Logan Bores M.D.   On: 09/17/2022 12:22       LOS: 1 day   [facility] Pager on [email or web address]  09/19/2022, 10:27 AM

## 2022-09-20 DIAGNOSIS — G9340 Encephalopathy, unspecified: Secondary | ICD-10-CM | POA: Diagnosis not present

## 2022-09-20 DIAGNOSIS — R4182 Altered mental status, unspecified: Secondary | ICD-10-CM | POA: Diagnosis not present

## 2022-09-20 DIAGNOSIS — Z794 Long term (current) use of insulin: Secondary | ICD-10-CM | POA: Diagnosis not present

## 2022-09-20 DIAGNOSIS — N1832 Chronic kidney disease, stage 3b: Secondary | ICD-10-CM | POA: Diagnosis not present

## 2022-09-20 DIAGNOSIS — E1165 Type 2 diabetes mellitus with hyperglycemia: Secondary | ICD-10-CM | POA: Diagnosis not present

## 2022-09-20 LAB — BASIC METABOLIC PANEL
Anion gap: 6 (ref 5–15)
BUN: 22 mg/dL (ref 8–23)
CO2: 25 mmol/L (ref 22–32)
Calcium: 8.3 mg/dL — ABNORMAL LOW (ref 8.9–10.3)
Chloride: 107 mmol/L (ref 98–111)
Creatinine, Ser: 1.8 mg/dL — ABNORMAL HIGH (ref 0.61–1.24)
GFR, Estimated: 38 mL/min — ABNORMAL LOW (ref >60)
Glucose, Bld: 241 mg/dL — ABNORMAL HIGH (ref 70–99)
Potassium: 3.9 mmol/L (ref 3.5–5.1)
Sodium: 138 mmol/L (ref 135–145)

## 2022-09-20 LAB — GLUCOSE, CAPILLARY
Glucose-Capillary: 135 mg/dL — ABNORMAL HIGH (ref 70–99)
Glucose-Capillary: 189 mg/dL — ABNORMAL HIGH (ref 70–99)
Glucose-Capillary: 246 mg/dL — ABNORMAL HIGH (ref 70–99)
Glucose-Capillary: 185 mg/dL — ABNORMAL HIGH (ref 70–99)

## 2022-09-20 LAB — CBC
HCT: 37.5 % — ABNORMAL LOW (ref 39.0–52.0)
Hemoglobin: 12.3 g/dL — ABNORMAL LOW (ref 13.0–17.0)
MCH: 30.7 pg (ref 26.0–34.0)
MCHC: 32.8 g/dL (ref 30.0–36.0)
MCV: 93.5 fL (ref 80.0–100.0)
Platelets: 210 10*3/uL (ref 150–400)
RBC: 4.01 MIL/uL — ABNORMAL LOW (ref 4.22–5.81)
RDW: 17.2 % — ABNORMAL HIGH (ref 11.5–15.5)
WBC: 9.3 10*3/uL (ref 4.0–10.5)
nRBC: 0 % (ref 0.0–0.2)

## 2022-09-20 NOTE — Progress Notes (Addendum)
[facility] PROGRESS NOTE   Fernando Hess PPI:[id number] DOB: 1945/10/07 DOA: 09/17/2022  PCP: [name], MD  Brief History/Interval Summary:  77 y.o. 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 Wife noted that he had a slow and unsteady gait.  He could not figure out how to open his mailbox.  Could not figure out how to shift his car to drive up the driveway.  EMS was called.  Patient was brought into the hospital.   Consultants: Neurology  Procedures: LTM EEG    Subjective/Interval History: No overnight issues noted.  Patient denies any headache chest pain shortness of breath nausea or vomiting.  Much more cooperative this morning.  Less distracted and confused.    Assessment/Plan:  Acute metabolic encephalopathy/concern for seizure activity/subacute stroke Patient seen by neurology.  MRI of the brain raised concern for subacute stroke. Patient remains on long-term monitoring.  Neurology is following.  Keppra is currently on hold. Neurology recommends repeating MRI in about 4 weeks. Patient on apixaban.   Echocardiogram not thought to be necessary by neurology and they have discontinued the order. LDL is 66.  Continue statin. HbA1c was 14.6 in November. TSH normal.  A63 and folic acid levels were normal when checked recently. PT and OT evaluation.  Home health is recommended.  Wife unable to provide supervision and assistance at home due to her own health problems. Dose of Seroquel was increased yesterday.  Patient mentation seems to be better today.  Mild QT prolongation noted on EKG as read by the computer.  However not that elevated when I measured it.  Less than 527m.  History of coronary artery disease He is status post CABG. Continue home medications. Cardiac status is  stable.  Chronic kidney disease stage IIIb Patient is renal function stable for the most part.  Avoid nephrotoxic agents.  Monitor urine output.  Diabetes mellitus type 2, uncontrolled with hyperglycemia HbA1c 14.6 in November.  He is noted to be on glargine and SSI.  Continue to monitor CBGs.  CBGs are stable for the most part. At home patient is on Basaglar 40 units every morning along with lispro 8 units 3 times a day.  Chronic diastolic CHF Last echocardiogram in 2022 showed a EF of 60 to 65% with grade 1 diastolic dysfunction. Lasix is on hold.  Paroxysmal atrial fibrillation Continue Eliquis.  Not noted to be on any rate limiting drugs.  Essential hypertension Apparently has had issues with orthostatic hypotension recently hence the reason he is not on any antihypertensives currently. Noted to have significantly elevated blood pressures overnight.  This is likely due to agitation.  Use as needed agents for now.  GERD Continue PPI  History of anxiety and depression Continue duloxetine.  Peripheral artery disease Stable.  Obstructive sleep apnea Not on CPAP.  This needs to be addressed in the outpatient setting  DVT Prophylaxis: On apixaban Code Status: Full code Family Communication: Will update wife later today. Disposition Plan: Home health recommended by PT but wife unable to care for the patient.  If he needs supervision and assistance then SNF will be ideal. Informed by the wife that she and patient are the only one's to get medical information and to make decisions and not the children.   Status is: Inpatient Remains inpatient appropriate because: Persistent agitation/delirium     Medications: Scheduled:  apixaban  5 mg Oral BID   atorvastatin  80 mg Oral QPM   DULoxetine  90 mg Oral Daily   insulin aspart  0-15 Units Subcutaneous TID WC   insulin aspart  0-5 Units Subcutaneous QHS   insulin glargine-yfgn  20 Units Subcutaneous Daily   pantoprazole  40 mg  Oral q AM   QUEtiapine  50 mg Oral BID   sodium chloride flush  3 mL Intravenous Q12H   Continuous: HQI:[id number] **OR** acetaminophen, haloperidol lactate, hydrALAZINE, polyethylene glycol  Antibiotics: Anti-infectives (From admission, onward)    None       Objective:  Vital Signs  Vitals:   09/20/22 0334 09/20/22 0400 09/20/22 0539 09/20/22 0735  BP: (!) 90/56 (!) 166/92  (!) 131/97  Pulse: 93   99  Resp: 18   20  Temp: 98.1 F (36.7 C)   97.9 F (36.6 C)  TempSrc: Oral   Oral  SpO2: 97%   95%  Weight:   81 kg   Height:        Intake/Output Summary (Last 24 hours) at 09/20/2022 1113 Last data filed at 09/20/2022 0900 Gross per 24 hour  Intake 320 ml  Output --  Net 320 ml    Filed Weights   09/17/22 1100 09/17/22 1235 09/20/22 0539  Weight: 80 kg 80 kg 81 kg    General appearance: Awake alert.  In no distress Resp: Clear to auscultation bilaterally.  Normal effort Cardio: S1-S2 is normal regular.  No S3-S4.  No rubs murmurs or bruit GI: Abdomen is soft.  Nontender nondistended.  Bowel sounds are present normal.  No masses organomegaly Extremities: No edema.  Full range of motion of lower extremities. Neurologic: Alert and oriented x3.  No focal neurological deficits.    Lab Results:  Data Reviewed: I have personally reviewed following labs and reports of the imaging studies  CBC: Recent Labs  Lab 09/17/22 1201 09/18/22 0023 09/20/22 0932  WBC 13.6* 10.5 9.3  NEUTROABS 9.0*  --   --   HGB 14.2  14.3 12.1* 12.3*  HCT 43.3  42.0 38.5* 37.5*  MCV 93.3 97.2 93.5  PLT 251 220 210     Basic Metabolic Panel: Recent Labs  Lab 09/17/22 1201 09/18/22 0023 09/20/22 0932  NA 144  143 141 138  K 4.0  4.0 3.6 3.9  CL 106  107 111 107  CO2 21* 23 25  GLUCOSE 78  79 167* 241*  BUN '20  19 23 22  '$ CREATININE 1.99*  1.90* 2.01* 1.80*  CALCIUM 9.1 8.1* 8.3*     GFR: Estimated Creatinine Clearance: 33.8 mL/min (A) (by C-G formula based on  SCr of 1.8 mg/dL (H)).  Liver Function Tests: Recent Labs  Lab 09/17/22 1201 09/18/22 0023  AST 35 27  ALT 29 26  ALKPHOS 131* 108  BILITOT 1.8* 1.0  PROT 5.8* 4.7*  ALBUMIN 3.0* 2.4*      Recent Labs  Lab 09/17/22 1541  AMMONIA 14     Coagulation Profile: Recent Labs  Lab 09/17/22 1201  INR 1.5*       BNP (last 3 results) Recent Labs    11/06/21 1031  PROBNP 661*     CBG: Recent Labs  Lab 09/19/22 0602 09/19/22 1150 09/19/22 1617 09/19/22 2104 09/20/22 0615  GLUCAP 210* 242* 274* 89 135*     Lipid Profile: Recent Labs    09/18/22 0023  CHOL 118  HDL 33*  LDLCALC 66  TRIG 97  CHOLHDL 3.6     Thyroid Function Tests: Recent Labs    09/17/22 1301  TSH 0.580      Recent Results (from the past 240 hour(s))  Resp panel by RT-PCR (RSV, Flu A&B, Covid) Anterior Nasal Swab     Status: None   Collection Time: 09/17/22  3:00 PM   Specimen: Anterior Nasal Swab  Result Value Ref Range Status   SARS Coronavirus 2 by RT PCR NEGATIVE NEGATIVE Final    Comment: (NOTE) SARS-CoV-2 target nucleic acids are NOT DETECTED.  The SARS-CoV-2 RNA is generally detectable in upper respiratory specimens during the acute phase of infection. The lowest concentration of SARS-CoV-2 viral copies this assay can detect is 138 copies/mL. A negative result does not preclude SARS-Cov-2 infection and should not be used as the sole basis for treatment or other patient management decisions. A negative result may occur with  improper specimen collection/handling, submission of specimen other than nasopharyngeal swab, presence of viral mutation(s) within the areas targeted by this assay, and inadequate number of viral copies(<138 copies/mL). A negative result must be combined with clinical observations, patient history, and epidemiological information. The expected result is Negative.  Fact Sheet for Patients:  EntrepreneurPulse.com.au  Fact  Sheet for Healthcare Providers:  IncredibleEmployment.be  This test is no t yet approved or cleared by the [place] FDA and  has been authorized for detection and/or diagnosis of SARS-CoV-2 by FDA under an Emergency Use Authorization (EUA). This EUA will remain  in effect (meaning this test can be used) for the duration of the COVID-19 declaration under Section 564(b)(1) of the Act, 21 U.S.C.section 360bbb-3(b)(1), unless the authorization is terminated  or revoked sooner.       Influenza A by PCR NEGATIVE NEGATIVE Final   Influenza B by PCR NEGATIVE NEGATIVE Final    Comment: (NOTE) The Xpert Xpress SARS-CoV-2/FLU/RSV plus assay is intended as an aid in the diagnosis of influenza from Nasopharyngeal swab specimens and should not be used as a sole basis for treatment. Nasal washings and aspirates are unacceptable for Xpert Xpress SARS-CoV-2/FLU/RSV testing.  Fact Sheet for Patients: EntrepreneurPulse.com.au  Fact Sheet for Healthcare Providers: IncredibleEmployment.be  This test is not yet approved or cleared by the [place] FDA and has been authorized for detection and/or diagnosis of SARS-CoV-2 by FDA under an Emergency Use Authorization (EUA). This EUA will remain in effect (meaning this test can be used) for the duration of the COVID-19 declaration under Section 564(b)(1) of the Act, 21 U.S.C. section 360bbb-3(b)(1), unless the authorization is terminated or revoked.     Resp Syncytial Virus by PCR NEGATIVE NEGATIVE Final    Comment: (NOTE) Fact Sheet for Patients: EntrepreneurPulse.com.au  Fact Sheet for Healthcare Providers: IncredibleEmployment.be  This test is not yet approved or cleared by the [place] FDA and has been authorized for detection and/or diagnosis of SARS-CoV-2 by FDA under an Emergency Use Authorization (EUA). This EUA will remain in effect  (meaning this test can be used) for the duration of the COVID-19 declaration under Section 564(b)(1) of the Act, 21 U.S.C. section 360bbb-3(b)(1), unless the authorization is terminated or revoked.  Performed at [facility], [place] [address], [place],  [address]       Radiology Studies: Overnight EEG with video  Result Date: 09/19/2022 Lora Havens, MD     09/20/2022  7:01 AM Patient Name: Fernando Hess MRN: [id number] Epilepsy Attending: Lora Havens Referring Physician/Provider: Lorenza Chick, MD Duration: 09/18/2022 4431  to 09/19/2022 1405 Patient history: 77 year old male with an episode of abnormal gait and confusion. EEG to evaluate for seizure. Level of alertness: Awake, asleep AEDs during EEG study: LEV Technical aspects: This EEG study was done with scalp electrodes positioned according to the 10-20 International system of electrode placement. Electrical activity was reviewed with band pass filter of 1-'70Hz'$ , sensitivity of 7 uV/mm, display speed of 55m/sec with a '60Hz'$  notched filter applied as appropriate. EEG data were recorded continuously and digitally stored.  Video monitoring was available and reviewed as appropriate. Description: The posterior dominant rhythm consists of 8 Hz activity of moderate voltage (25-35 uV) seen predominantly in posterior head regions, symmetric and reactive to eye opening and eye closing. Sleep was characterized by vertex waves, sleep spindles (12 to 14 Hz), maximal frontocentral region. Hyperventilation and photic stimulation were not performed.   IMPRESSION: This study is within normal limits. No seizures or epileptiform discharges were seen throughout the recording. A normal interictal EEG does not exclude the diagnosis of epilepsy. [place]      LOS: 2 days   Oneda Duffett [email or web address] on [email or web address]  09/20/2022, 11:13 AM

## 2022-09-20 NOTE — Procedures (Addendum)
Patient Name: DAKOTAH ORREGO  MRN: [id number]  Epilepsy Attending: Lora Havens  Referring Physician/Provider: Lorenza Chick, MD  Duration: 09/19/2022 1405 to 09/20/2022 1405   Patient history: 77 year old male with an episode of abnormal gait and confusion. EEG to evaluate for seizure.    Level of alertness: Awake, asleep   AEDs during EEG study: None   Technical aspects: This EEG study was done with scalp electrodes positioned according to the 10-20 International system of electrode placement. Electrical activity was reviewed with band pass filter of 1-'70Hz'$ , sensitivity of 7 uV/mm, display speed of 37m/sec with a '60Hz'$  notched filter applied as appropriate. EEG data were recorded continuously and digitally stored.  Video monitoring was available and reviewed as appropriate.   Description: The posterior dominant rhythm consists of 8 Hz activity of moderate voltage (25-35 uV) seen predominantly in posterior head regions, symmetric and reactive to eye opening and eye closing. Sleep was characterized by vertex waves, sleep spindles (12 to 14 Hz), maximal frontocentral region. Hyperventilation and photic stimulation were not performed.      IMPRESSION: This study is within normal limits. No seizures or epileptiform discharges were seen throughout the recording.    Derrel Moore OBarbra Sarks

## 2022-09-20 NOTE — Progress Notes (Signed)
MB check units to confirm running with no errors, and performed skin check and maintenance. Study was Resumed and Verified scrolling in background

## 2022-09-20 NOTE — Progress Notes (Signed)
Neurology Progress Note  Brief HPI: 77 y.o. male with a past medical history of CAD, afib on eliquis, CHF, CKD, GERD, CABG, HLD, HTN, OSA, DM2 who presents with altered mental status. He went to a doctors appointment with his wife and was walking slow/unsteady and speaking softly during the appointment. When they got home he pulled up to the mailbox and couldn't figure out how to open it, he then couldn't figure out how to shift the car back into drive and was trying to use the heat control to shift the car. His wife tells me that home health has not been coming to the house for the last couple of weeks but he did take all of his medications this morning. He has been having episodes of diarrhea for the last couple of days. He was supposed to get blood work done on Tuesday and he did not eat on Monday because he "wanted his blood sugar to be low."  On arrival BP is 151/83 and glucose is 80.    Subjective/Interval: Patient seen in room with no family at the bedside.  Restraints have been discontinued and he is mostly alert and oriented (is able to state that he is in [facility] but states the city is [place]), calm and cooperative with exam.   He is unable to recall that he has been on continuous EEG monitoring to assess for seizures  Exam: Current vital signs: BP (!) 131/97 (BP Location: Right Arm)   Pulse 99   Temp 97.9 F (36.6 C) (Oral)   Resp 20   Ht '5\' 6"'$  (1.676 m)   Wt 81 kg   SpO2 95%   BMI 28.82 kg/m  Vital signs in last 24 hours: Temp:  [97.9 F (36.6 C)-98.6 F (37 C)] 97.9 F (36.6 C) (01/06 0735) Pulse Rate:  [87-102] 99 (01/06 0735) Resp:  [17-20] 20 (01/06 0735) BP: (90-166)/(56-97) 131/97 (01/06 0735) SpO2:  [95 %-99 %] 95 % (01/06 0735) Weight:  [81 kg] 81 kg (01/06 0539)   Gen: In bed, NAD Resp: non-labored breathing, no acute distress  Neuro: Mental Status: Aaox3, able to correctly identify hospital but states incorrect city Speech is clear and  fluent Cranial Nerves: II: Visual Fields are full. PERRL.   III,IV, VI: EOMI without ptosis or diploplia.  V: facial sensation symmetrical VII: Face is symmetrical VIII: Hearing is intact to voice X: Palate elevates symmetrically XI: Shoulder shrug is symmetric. XII: Tongue protrudes midline without atrophy or fasciculations.  Motor: Good antigravity strength in all 4 extremities Sensory: Sensation is symmetric to light touch in the arms and legs.    Pertinent Labs: LDL 66 A1c-10.7 (notably 14.6 one-month ago) Cr 2.01 Lab Results  Component Value Date   HGBA1C 10.7 (H) 09/18/2022    Lab Results  Component Value Date   CHOL 118 09/18/2022   HDL 33 (L) 09/18/2022   LDLCALC 66 09/18/2022   TRIG 97 09/18/2022   CHOLHDL 3.6 09/18/2022     Imaging Reviewed:  MRI- 1. Small focus of hyperintense signal on diffusion-weighted imaging in the right occipital lobe is new compared to prior MRI brain dated 08/12/2022, but without a definite correlate on the ADC map. There is also contrast enhancement in this region, suggestive of a subacute infarct. Additional focus of contrast enhancement in the medial aspect of the right occipital lobe, which is also favored to represent an additional site of a subacute infarct. Recommend repeat contrast enhanced brain MRI in 4 weeks to  assess for interval change. 2. Asymmetric contrast enhancement of the mastoid segment of the right facial nerve, which is nonspecific but can be seen in the setting of Bell's palsy.  EEG 11/29 This study is within normal limits. No seizures or epileptiform discharges were seen throughout the recording.   EEG 1/04 -Intermittent slow, right > left posterior quadrant  This study is suggestive of cortical dysfunction arising from right more than left posterior quadrant, nonspecific etiology. No seizures or epileptiform discharges were seen throughout the recording. Please note that lack of epileptiform activity  does not exclude the diagnosis of epilepsy.  EEG 1/06: This study is within normal limits. No seizures or epileptiform discharges were seen throughout the recording   Assessment:  77 y.o. male with a past medical history of CAD, afib on eliquis, CHF, CKD, GERD, CABG, HLD, HTN, OSA, DM2 who presents with altered mental status. He went to a doctors appointment with his wife and was walking slow/unsteady and speaking softly during the appointment. When they got home he pulled up to the mailbox and couldn't figure out how to open it, he then couldn't figure out how to shift the car back into drive and was trying to use the heat control to shift the car, subsequently had right-sided weakness and global aphasia. On arrival BP is 151/83 and glucose is 80.   Overall the story is concerning for possible focal seizure with impaired awareness, especially given MRI is negative for stroke in the left MCA distribution though interestingly it is positive for some subacute strokes in the right hemisphere which would not explain his symptoms.  However given no ictal/interictal abnormalities on prolonged EEG monitoring for over 24 hours off of antiseizure medications, I feel seizure is less likely.  I favor a left MCA territory TIA, especially with additional information from family that he is likely missing doses of his medication (mechanism would be concern for Eliquis nonadherence).  He is previously optimized from a stroke risk perspective except for A1c which has improved.  Marked drop in his A1c from 14.6 to 10.7 and only a 1 month time course is also concerning for likelihood of significant interim hypoglycemia again raising concern that he cannot safely manage his medications at home  Impression: Cognitive impairment with concern for underlying dementia Left MCA territory TIA in the setting of medication nonadherence  Recommendations: - Can discontinue LTM EEG as no seizure activity seen off AEDs - Driving  restrictions due to significant cognitive impairment, would recommend clearance by specialized driving evaluation if patient desires to return to driving - Close supervision for medication adherence - Neurology will be available as needed going forward, please reach out if any additional questions or concerns arise  Unchanged prior recs - Outpatient MRI brain in 4-8 weeks to follow-up incidentally found subacute strokes to confirm expected evolution and exclude malignancy - Outpatient sleep study and reinitiation of CPAP - Wife given number to schedule with [facility], patient has not followed up on last referral placed in late November  Patient seen and examined by NP/APP with MD. MD to update note as needed.   [place] , MSN, AGACNP-BC [facility] See Amion for schedule and pager information 09/20/2022 9:05 AM  Attending Neurologist's note:  I personally saw this patient, gathering history, performing a full neurologic examination, reviewing relevant labs, personally reviewing relevant imaging, and formulated the assessment and plan, adding the note above for completeness and clarity to accurately reflect my thoughts  Lesleigh Noe MD-PhD [facility] [phone] Available 7 AM to 7 PM, outside these hours please contact Neurologist on call listed on AMION

## 2022-09-21 DIAGNOSIS — E1165 Type 2 diabetes mellitus with hyperglycemia: Secondary | ICD-10-CM | POA: Diagnosis not present

## 2022-09-21 DIAGNOSIS — G9340 Encephalopathy, unspecified: Secondary | ICD-10-CM | POA: Diagnosis not present

## 2022-09-21 DIAGNOSIS — N1832 Chronic kidney disease, stage 3b: Secondary | ICD-10-CM | POA: Diagnosis not present

## 2022-09-21 DIAGNOSIS — Z794 Long term (current) use of insulin: Secondary | ICD-10-CM | POA: Diagnosis not present

## 2022-09-21 DIAGNOSIS — R4182 Altered mental status, unspecified: Secondary | ICD-10-CM | POA: Diagnosis not present

## 2022-09-21 LAB — GLUCOSE, CAPILLARY
Glucose-Capillary: 240 mg/dL — ABNORMAL HIGH (ref 70–99)
Glucose-Capillary: 267 mg/dL — ABNORMAL HIGH (ref 70–99)
Glucose-Capillary: 172 mg/dL — ABNORMAL HIGH (ref 70–99)
Glucose-Capillary: 110 mg/dL — ABNORMAL HIGH (ref 70–99)

## 2022-09-21 MED ORDER — INSULIN GLARGINE-YFGN 100 UNIT/ML ~~LOC~~ SOLN
24.0000 [IU] | Freq: Every day | SUBCUTANEOUS | Status: DC
  Administered 2022-09-22 – 2022-09-24 (×3): 24 [IU] via SUBCUTANEOUS
  Filled 2022-09-21 (×3): qty 0.24

## 2022-09-21 NOTE — Progress Notes (Signed)
vLTM discontinued.  No skin breakdown noted at all skin sites.  [facility] notified 

## 2022-09-21 NOTE — Procedures (Signed)
Patient Name: MALIQ PILLEY  MRN: [id number]  Epilepsy Attending: Lora Havens  Referring Physician/Provider: Lorenza Chick, MD  Duration: 09/20/2022 1405 to 09/21/2022 5329   Patient history: 77 year old male with an episode of abnormal gait and confusion. EEG to evaluate for seizure.    Level of alertness: Awake, asleep   AEDs during EEG study: None   Technical aspects: This EEG study was done with scalp electrodes positioned according to the 10-20 International system of electrode placement. Electrical activity was reviewed with band pass filter of 1-'70Hz'$ , sensitivity of 7 uV/mm, display speed of 20m/sec with a '60Hz'$  notched filter applied as appropriate. EEG data were recorded continuously and digitally stored.  Video monitoring was available and reviewed as appropriate.   Description: The posterior dominant rhythm consists of 8 Hz activity of moderate voltage (25-35 uV) seen predominantly in posterior head regions, symmetric and reactive to eye opening and eye closing. Sleep was characterized by vertex waves, sleep spindles (12 to 14 Hz), maximal frontocentral region. Hyperventilation and photic stimulation were not performed.      IMPRESSION: This study is within normal limits. No seizures or epileptiform discharges were seen throughout the recording.  Annelle Behrendt OBarbra Sarks

## 2022-09-21 NOTE — Progress Notes (Signed)
Occupational Therapy Treatment Patient Details Name: Fernando Hess MRN: [id number] DOB: [birth year] Today's Date: 09/21/2022   History of present illness 77 y.o. male admitted with confusion and some difficulty with gait.  MRI with small focus of hyperintense signal on diffusion-weighted imaging in the right occipital lobe is new compared to prior MRI in November. 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   OT comments  Patient making good gains with bed mobility, transfers, and standing at sink for self care. Patient required cues to not pick up RW when ambulating and for safety. Patient states he may need discharge to SNF due to lack of assistance at home. Patient to continue to be followed by acute OT to address ADL transfers and self care.    Recommendations for follow up therapy are one component of a multi-disciplinary discharge planning process, led by the attending physician.  Recommendations may be updated based on patient status, additional functional criteria and insurance authorization.    Follow Up Recommendations  No OT follow up     Assistance Recommended at Discharge PRN  Patient can return home with the following  Assist for transportation;Direct supervision/assist for medications management;Help with stairs or ramp for entrance   Equipment Recommendations  None recommended by OT    Recommendations for Other Services      Precautions / Restrictions Precautions Precautions: Fall Precaution Comments: two falls over past 3 weeks since d/c Restrictions Weight Bearing Restrictions: No       Mobility Bed Mobility Overal bed mobility: Needs Assistance Bed Mobility: Supine to Sit     Supine to sit: Supervision     General bed mobility comments: iincreased time and use of bed rails    Transfers Overall transfer level: Needs  assistance Equipment used: Rolling walker (2 wheels) Transfers: Sit to/from Stand Sit to Stand: Supervision     Step pivot transfers: Min guard     General transfer comment: min guard for safet with transfers     Balance Overall balance assessment: Needs assistance Sitting-balance support: No upper extremity supported Sitting balance-Leahy Scale: Good     Standing balance support: No upper extremity supported, Bilateral upper extremity supported Standing balance-Leahy Scale: Fair Standing balance comment: able to stand at sink with no UE support                           ADL either performed or assessed with clinical judgement   ADL Overall ADL's : Needs assistance/impaired     Grooming: Wash/dry hands;Wash/dry face;Oral care;Supervision/safety;Standing Grooming Details (indicate cue type and reason): at sink         Upper Body Dressing : Minimal assistance;Standing Upper Body Dressing Details (indicate cue type and reason): to donn gown to cover back Lower Body Dressing: Supervision/safety;Sitting/lateral leans Lower Body Dressing Details (indicate cue type and reason): figure four to doff and donn socks Toilet Transfer: Min guard;Rolling walker (2 wheels) Toilet Transfer Details (indicate cue type and reason): simulated           General ADL Comments: min guard for most self care tasks when standing    Extremity/Trunk Assessment              Vision       Perception     Praxis      Cognition Arousal/Alertness: Awake/alert Behavior During Therapy: WFL for tasks assessed/performed Overall Cognitive Status:  Impaired/Different from baseline Area of Impairment: Orientation, Attention, Safety/judgement, Problem solving                   Current Attention Level: Sustained     Safety/Judgement: Decreased awareness of safety, Decreased awareness of deficits   Problem Solving: Slow processing General Comments: able to follow  directions, cues for safety        Exercises      Shoulder Instructions       General Comments      Pertinent Vitals/ Pain       Pain Assessment Pain Assessment: Faces Faces Pain Scale: Hurts a little bit Pain Location: back Pain Descriptors / Indicators: Grimacing Pain Intervention(s): Monitored during session, Repositioned  Home Living                                          Prior Functioning/Environment              Frequency  Min 2X/week        Progress Toward Goals  OT Goals(current goals can now be found in the care plan section)  Progress towards OT goals: Progressing toward goals  Acute Rehab OT Goals Patient Stated Goal: get better OT Goal Formulation: With patient Time For Goal Achievement: 10/02/22 Potential to Achieve Goals: Good ADL Goals Pt Will Perform Grooming: standing;with modified independence Pt Will Perform Lower Body Bathing: with supervision;sit to/from stand Pt Will Perform Lower Body Dressing: with supervision;sit to/from stand Pt Will Transfer to Toilet: with supervision;ambulating;grab bars;regular height toilet Pt Will Perform Toileting - Clothing Manipulation and hygiene: with supervision;sit to/from stand Pt Will Perform Tub/Shower Transfer: with supervision;Tub transfer;shower seat;ambulating;grab bars  Plan Discharge plan remains appropriate    Co-evaluation                 AM-PAC OT "6 Clicks" Daily Activity     Outcome Measure   Help from another person eating meals?: None Help from another person taking care of personal grooming?: A Little Help from another person toileting, which includes using toliet, bedpan, or urinal?: A Little Help from another person bathing (including washing, rinsing, drying)?: A Little Help from another person to put on and taking off regular upper body clothing?: None Help from another person to put on and taking off regular lower body clothing?: A Little 6 Click  Score: 20    End of Session Equipment Utilized During Treatment: Rolling walker (2 wheels)  OT Visit Diagnosis: Unsteadiness on feet (R26.81);Other symptoms and signs involving cognitive function;Repeated falls (R29.6)   Activity Tolerance Patient tolerated treatment well   Patient Left in chair;with call bell/phone within reach;with chair alarm set   Nurse Communication Mobility status        Time: 1031-1056 OT Time Calculation (min): 25 min  Charges: OT General Charges $OT Visit: 1 Visit OT Treatments $Self Care/Home Management : 8-22 mins $Therapeutic Activity: 8-22 mins  Lodema Hong, [place]  Office [phone]   Trixie Dredge 09/21/2022, 12:57 PM

## 2022-09-21 NOTE — Progress Notes (Signed)
[facility] PROGRESS NOTE   Fernando Hess RFF:[id number] DOB: 1946-03-13 DOA: 09/17/2022  PCP: Fernando Cruel, MD  Brief History/Interval Summary:  77 y.o. 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 Wife noted that he had a slow and unsteady gait.  He could not figure out how to open his mailbox.  Could not figure out how to shift his car to drive up the driveway.  EMS was called.  Patient was brought into the hospital.   Consultants: Neurology  Procedures: LTM EEG    Subjective/Interval History: Patient mentions that he feels well.  Denies any new complaints.  Taken off of the continuous EEG this morning.    Assessment/Plan:  Acute metabolic encephalopathy/concern for seizure activity/subacute stroke Patient seen by neurology.  MRI of the brain raised concern for subacute stroke. Patient was placed on continuous EEG.  Keppra was kept on hold. No epileptiform activity noted on EEG.  Discontinued.  No recommendation for antiepileptic drugs. Neurology recommends repeating MRI in about 4 weeks.  They also recommend outpatient sleep study.  They have given contact information for [facility] to his wife for follow-up. Patient on apixaban.   Echocardiogram not thought to be necessary by neurology and they have discontinued the order. LDL is 66.  Continue statin. HbA1c was 14.6 in November. TSH normal.  J57 and folic acid levels were normal when checked recently. PT and OT evaluation.  Home health is recommended.  Wife unable to provide supervision and assistance at home due to her own health problems. Dose of Seroquel was increased.  Patient's mentation continues to improve.  Mild QT prolongation noted on EKG as read by the computer.  However not that elevated when I measured it.   Less than 5109m. PT and OT to reevaluate and then we need to determine further course of action.  History of coronary artery disease He is status post CABG. Continue home medications. Cardiac status is stable.  Chronic kidney disease stage IIIb Baseline creatinine is between 1.8-2.2.  Patient is renal function stable for the most part.  Avoid nephrotoxic agents.  Monitor urine output.  Diabetes mellitus type 2, uncontrolled with hyperglycemia HbA1c 14.6 in November.  He is noted to be on glargine and SSI.  Continue to monitor CBGs.  CBGs are stable for the most part. At home patient is on Basaglar 40 units every morning along with lispro 8 units 3 times a day.  Chronic diastolic CHF Last echocardiogram in 2022 showed a EF of 60 to 65% with grade 1 diastolic dysfunction. Lasix is on hold.  Paroxysmal atrial fibrillation Continue Eliquis.  Not noted to be on any rate limiting drugs.  Essential hypertension Apparently has had issues with orthostatic hypotension recently hence the reason he is not on any antihypertensives currently. Blood pressure stable for the most part.  Occasional high readings noted.  GERD Continue PPI  History of anxiety and depression Continue duloxetine.  Peripheral artery disease Stable.  Obstructive sleep apnea Not on CPAP.  This needs to be addressed in the outpatient setting  DVT Prophylaxis: On apixaban Code Status: Full code Family Communication: Wife is updated yesterday. Disposition Plan: Home health recommended by PT but wife unable to care for the patient.  If he needs supervision and assistance then SNF will be ideal. Informed by the wife that she and patient are the  only one's to get medical information and to make decisions and not the children.   Status is: Inpatient Remains inpatient appropriate because: Persistent agitation/delirium     Medications: Scheduled:  apixaban  5 mg Oral BID   atorvastatin  80 mg Oral QPM   DULoxetine   90 mg Oral Daily   insulin aspart  0-15 Units Subcutaneous TID WC   insulin aspart  0-5 Units Subcutaneous QHS   insulin glargine-yfgn  20 Units Subcutaneous Daily   pantoprazole  40 mg Oral q AM   QUEtiapine  50 mg Oral BID   sodium chloride flush  3 mL Intravenous Q12H   Continuous: TLX:[id number] **OR** acetaminophen, haloperidol lactate, hydrALAZINE, polyethylene glycol  Antibiotics: Anti-infectives (From admission, onward)    None       Objective:  Vital Signs  Vitals:   09/20/22 1944 09/20/22 2325 09/21/22 0430 09/21/22 0803  BP: 128/84 117/82 (!) 136/90 (!) 156/84  Pulse: 95 95 (!) 102 99  Resp: '15 12 14 18  '$ Temp: 98 F (36.7 C) 98.4 F (36.9 C) 97.9 F (36.6 C) 98.2 F (36.8 C)  TempSrc: Oral Oral Axillary Oral  SpO2: 96% 95% 96% 93%  Weight:   81 kg   Height:        Intake/Output Summary (Last 24 hours) at 09/21/2022 1019 Last data filed at 09/20/2022 1800 Gross per 24 hour  Intake 660 ml  Output --  Net 660 ml    Filed Weights   09/17/22 1235 09/20/22 0539 09/21/22 0430  Weight: 80 kg 81 kg 81 kg    General appearance: Awake alert.  In no distress Resp: Clear to auscultation bilaterally.  Normal effort Cardio: S1-S2 is normal regular.  No S3-S4.  No rubs murmurs or bruit GI: Abdomen is soft.  Nontender nondistended.  Bowel sounds are present normal.  No masses organomegaly Extremities: No edema.  Full range of motion of lower extremities.    Lab Results:  Data Reviewed: I have personally reviewed following labs and reports of the imaging studies  CBC: Recent Labs  Lab 09/17/22 1201 09/18/22 0023 09/20/22 0932  WBC 13.6* 10.5 9.3  NEUTROABS 9.0*  --   --   HGB 14.2  14.3 12.1* 12.3*  HCT 43.3  42.0 38.5* 37.5*  MCV 93.3 97.2 93.5  PLT 251 220 210     Basic Metabolic Panel: Recent Labs  Lab 09/17/22 1201 09/18/22 0023 09/20/22 0932  NA 144  143 141 138  K 4.0  4.0 3.6 3.9  CL 106  107 111 107  CO2 21* 23 25  GLUCOSE  78  79 167* 241*  BUN '20  19 23 22  '$ CREATININE 1.99*  1.90* 2.01* 1.80*  CALCIUM 9.1 8.1* 8.3*     GFR: Estimated Creatinine Clearance: 33.8 mL/min (A) (by C-G formula based on SCr of 1.8 mg/dL (H)).  Liver Function Tests: Recent Labs  Lab 09/17/22 1201 09/18/22 0023  AST 35 27  ALT 29 26  ALKPHOS 131* 108  BILITOT 1.8* 1.0  PROT 5.8* 4.7*  ALBUMIN 3.0* 2.4*      Recent Labs  Lab 09/17/22 1541  AMMONIA 14     Coagulation Profile: Recent Labs  Lab 09/17/22 1201  INR 1.5*     CBG: Recent Labs  Lab 09/20/22 0615 09/20/22 1206 09/20/22 1630 09/20/22 2112 09/21/22 0615  GLUCAP 135* 189* 246* 185* 240*     Recent Results (from the past 240 hour(s))  Resp panel by RT-PCR (RSV,  Flu A&B, Covid) Anterior Nasal Swab     Status: None   Collection Time: 09/17/22  3:00 PM   Specimen: Anterior Nasal Swab  Result Value Ref Range Status   SARS Coronavirus 2 by RT PCR NEGATIVE NEGATIVE Final    Comment: (NOTE) SARS-CoV-2 target nucleic acids are NOT DETECTED.  The SARS-CoV-2 RNA is generally detectable in upper respiratory specimens during the acute phase of infection. The lowest concentration of SARS-CoV-2 viral copies this assay can detect is 138 copies/mL. A negative result does not preclude SARS-Cov-2 infection and should not be used as the sole basis for treatment or other patient management decisions. A negative result may occur with  improper specimen collection/handling, submission of specimen other than nasopharyngeal swab, presence of viral mutation(s) within the areas targeted by this assay, and inadequate number of viral copies(<138 copies/mL). A negative result must be combined with clinical observations, patient history, and epidemiological information. The expected result is Negative.  Fact Sheet for Patients:  EntrepreneurPulse.com.au  Fact Sheet for Healthcare Providers:  IncredibleEmployment.be  This  test is no t yet approved or cleared by the [place] FDA and  has been authorized for detection and/or diagnosis of SARS-CoV-2 by FDA under an Emergency Use Authorization (EUA). This EUA will remain  in effect (meaning this test can be used) for the duration of the COVID-19 declaration under Section 564(b)(1) of the Act, 21 U.S.C.section 360bbb-3(b)(1), unless the authorization is terminated  or revoked sooner.       Influenza A by PCR NEGATIVE NEGATIVE Final   Influenza B by PCR NEGATIVE NEGATIVE Final    Comment: (NOTE) The Xpert Xpress SARS-CoV-2/FLU/RSV plus assay is intended as an aid in the diagnosis of influenza from Nasopharyngeal swab specimens and should not be used as a sole basis for treatment. Nasal washings and aspirates are unacceptable for Xpert Xpress SARS-CoV-2/FLU/RSV testing.  Fact Sheet for Patients: EntrepreneurPulse.com.au  Fact Sheet for Healthcare Providers: IncredibleEmployment.be  This test is not yet approved or cleared by the [place] FDA and has been authorized for detection and/or diagnosis of SARS-CoV-2 by FDA under an Emergency Use Authorization (EUA). This EUA will remain in effect (meaning this test can be used) for the duration of the COVID-19 declaration under Section 564(b)(1) of the Act, 21 U.S.C. section 360bbb-3(b)(1), unless the authorization is terminated or revoked.     Resp Syncytial Virus by PCR NEGATIVE NEGATIVE Final    Comment: (NOTE) Fact Sheet for Patients: EntrepreneurPulse.com.au  Fact Sheet for Healthcare Providers: IncredibleEmployment.be  This test is not yet approved or cleared by the [place] FDA and has been authorized for detection and/or diagnosis of SARS-CoV-2 by FDA under an Emergency Use Authorization (EUA). This EUA will remain in effect (meaning this test can be used) for the duration of the COVID-19 declaration under  Section 564(b)(1) of the Act, 21 U.S.C. section 360bbb-3(b)(1), unless the authorization is terminated or revoked.  Performed at [facility], [place] [address] [place], [place] [address]       Radiology Studies: No results found.     LOS: 3 days   Celicia Minahan [employer] on [email or web address]  09/21/2022, 10:19 AM

## 2022-09-22 LAB — GLUCOSE, CAPILLARY
Glucose-Capillary: 154 mg/dL — ABNORMAL HIGH (ref 70–99)
Glucose-Capillary: 231 mg/dL — ABNORMAL HIGH (ref 70–99)
Glucose-Capillary: 274 mg/dL — ABNORMAL HIGH (ref 70–99)
Glucose-Capillary: 148 mg/dL — ABNORMAL HIGH (ref 70–99)

## 2022-09-22 MED ORDER — POLYETHYLENE GLYCOL 3350 17 G PO PACK: 17.0000 g | PACK | Freq: Every day | ORAL | 0 refills | Status: DC | PRN

## 2022-09-22 NOTE — Inpatient Diabetes Management (Signed)
Inpatient Diabetes Program Recommendations  AACE/ADA: New Consensus Statement on Inpatient Glycemic Control (2015)  Target Ranges:  Prepandial:   less than 140 mg/dL      Peak postprandial:   less than 180 mg/dL (1-2 hours)      Critically ill patients:  140 - 180 mg/dL   Lab Results  Component Value Date   GLUCAP 231 (H) 09/22/2022   HGBA1C 10.7 (H) 09/18/2022    Review of Glycemic Control  Latest Reference Range & Units 09/22/22 06:06 09/22/22 11:48  Glucose-Capillary 70 - 99 mg/dL 154 (H) 231 (H)  (H): Data is abnormally high  Diabetes history: DM 2 Outpatient Diabetes medications: Basaglar 40 units qam, Humalog 8 units tid meal coverage Current orders for Inpatient glycemic control:  Semglee 24 units Daily Novolog 0-15 units tid + hs  Inpatient Diabetes Program Recommendations:    Noted semglee increased today to 24 units QD.  Might also consider: Novolog 2 units TID with meals.    Will continue to follow while inpatient.  Thank you, Reche Dixon, MSN, [place] Diabetes Coordinator Inpatient Diabetes Program [phone] (team pager from 8a-5p)

## 2022-09-22 NOTE — TOC Progression Note (Addendum)
Transition of Care [facility]) - Progression Note    Patient Details  Name: Fernando Hess MRN: [id number] Date of Birth: 10-07-1945  Transition of Care [facility]) CM/SW Contact  Pollie Friar, RN Phone Number: 09/22/2022, 11:28 AM  Clinical Narrative:    CM spoke at length with spouse about SNF rehab and options after a rehab stay. She is in agreement with him being faxed out for SNF rehab. CM had met with the patient prior to calling wife and he was in agreement with rehab also. He gave permission for CM to talk with spouse. He is also ok with his children having information on what is going on with him and plans for rehab.  CM will provide bed offers to family after lunch.   1544: Pts son chose [facility]. They have accepted. CM has asked the MOA to start insurance auth. PASAR pending and under review.  Expected Discharge Plan: [place] Barriers to Discharge: Continued Medical Work up  Expected Discharge Plan and [place]: [place] Living arrangements for the past 2 months: Single Family Home                                       Social Determinants of Health (SDOH) Interventions SDOH Screenings   Food Insecurity: No Food Insecurity (09/18/2022)  Housing: Low Risk  (09/18/2022)  Transportation Needs: No Transportation Needs (09/18/2022)  Utilities: Not At Risk (09/18/2022)  Depression (PHQ2-9): Low Risk  (05/20/2019)  Tobacco Use: Low Risk  (09/17/2022)    Readmission Risk Interventions     No data to display

## 2022-09-22 NOTE — Progress Notes (Signed)
Mobility Specialist Progress Note   09/22/22 1550  Mobility  Activity Ambulated with assistance in hallway  Level of Assistance Contact guard assist, steadying assist  [place] wheel walker  Distance Ambulated (ft) 240 ft  Range of Motion/Exercises Active;All extremities  Activity Response Tolerated well   Patient received in supine and agreeable to participate. Ambulated min guard with RW and slow steady gait. Returned to room without complaint or incident. Was left in supine with all needs met, call bell in reach.   Fernando Hess, BS EXP Mobility Specialist Please contact via SecureChat or Rehab office at [phone]

## 2022-09-22 NOTE — Progress Notes (Signed)
Speech Language Pathology Treatment: Cognitive-Linquistic  Patient Details Name: Fernando Hess MRN: [id number] DOB: [birth year] Today's Date: 09/22/2022 Time: 9518-8416 SLP Time Calculation (min) (ACUTE ONLY): 14 min  Assessment / Plan / Recommendation Clinical Impression  Pt seen for ongoing cognitive assessment.  Pt less agitated today as compared to 1/4, sitting in chair in room.  He was assessed using the SLUMS and scored 18/30 suggestive of dementia.  Pt incorrectly answered orientation questions but was stimulable for correct answer with questions.  Pt exhibited no impairment for word (4/5) and story (8/8) recall tasks.  Pt was able to recall remaining word target with category cue.  Pt did not exhibit word finding deficits in conversation  He generated 10 items in 60s for divergent naming task. Pt does exhibit some executive function deficits with errors in calculations and on his clock drawing.  Numbers were in correct order, but were incorrectly spaced and while hands were correctly placed, he did not differentiate between hour and minute hands.   Pt was unable to perform reverse digit span task past 2 items.  Pt is agreeable to and perhaps eager for rehab stay.  He says he does not want to go home before he is ready.  Suspect that today's performance is relatively close to baseline, but no family present to confirm.  Pt would likely benefit from ongoing ST in house and at next level of care to address the above noted deficits.    HPI HPI: 77 y.o. male who presented as a code stroke. MRI 1/3 with subacute infarcts: "Small focus of hyperintense signal on diffusion-weighted imaging in the right occipital lobe is new compared to prior MRI brain dated  08/12/2022, but without a definite correlate on the ADC map. There is also contrast enhancement in this region, suggestive of a subacute infarct. Additional focus of contrast enhancement in the medial aspect of the right occipital lobe, which is also  favored to represent an additional site of a subacute infarct. Recommend repeat contrast enhanced brain MRI in 4 weeks to assess for interval change." Per chart review, wife describes memory decline over past 3 years. 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      SLP Plan  Continue with current plan of care      Recommendations for follow up therapy are one component of a multi-disciplinary discharge planning process, led by the attending physician.  Recommendations may be updated based on patient status, additional functional criteria and insurance authorization.    Recommendations                   Follow Up Recommendations: Skilled nursing-short term rehab (<3 hours/day) Assistance recommended at discharge: Intermittent Supervision/Assistance SLP Visit Diagnosis: Cognitive communication deficit (S06.301) Plan: Continue with current plan of care           Celedonio Savage, [place], JAARS Office: [phone] 09/22/2022, 10:17 AM

## 2022-09-22 NOTE — NC FL2 (Signed)
[place] LEVEL OF CARE FORM     IDENTIFICATION  Patient Name: Fernando Hess Birthdate: 1946/06/19 Sex: male Admission Date (Current Location): 09/17/2022  [facility] and [place] Number:  Herbalist and Address:  [facility], [place] [address], [place], [place] [address]      Provider Number: [id number]  Attending Physician Name and Address:  [name], MD  Relative Name and Phone Number:       Current Level of Care: Hospital Recommended Level of Care: [place] Prior Approval Number:    Date Approved/Denied:   PASRR Number:    Discharge Plan: SNF    Current Diagnoses: Patient Active Problem List   Diagnosis Date Noted   Acute metabolic encephalopathy 81/19/1478   Acute encephalopathy 09/17/2022   AKI (acute kidney injury) ([place]) 09/04/2022   Generalized weakness 08/12/2022   Myasthenia gravis ([place]) 04/21/2022   Chronic kidney disease, stage 3b ([place]) 03/21/2022   Posterior vitreous detachment of both eyes 01/23/2022   Constipation 11/13/2021   Diabetes mellitus, type 2 ([place]) 11/13/2021   Severe major depression, single episode, without psychotic features ([place]) 11/13/2021   Amaurosis fugax 11/13/2021   Amnesia 11/13/2021   Anxiety 11/13/2021   Cardiac arrhythmia 11/13/2021   Hearing loss 11/13/2021   Hypercoagulable state ([place]) 11/13/2021   Hyperparathyroidism due to renal insufficiency ([place]) 11/13/2021   Mild aortic stenosis 11/05/2021   Paroxysmal atrial fibrillation ([place]) 11/05/2021   Nasal septal deviation 08/30/2021   Anticoagulated by anticoagulation treatment 07/16/2021   Syncope 05/10/2021   Gastro-esophageal reflux disease without esophagitis 07/25/2020   Globus pharyngeus 07/25/2020   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 ([place]) 12/21/2019   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   Chronic heart failure with preserved ejection fraction (HFpEF) ([place])  11/18/2019   OSA on CPAP 03/07/2019   Peripheral artery disease    Intractable vascular headache 11/11/2018   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   Coronary artery disease involving native coronary artery of native heart without angina pectoris 09/24/2015   Moderate nonproliferative diabetic retinopathy of left eye with macular edema associated with type 2 diabetes mellitus ([place]) 03/01/2008   Hyperlipidemia LDL goal <70    Essential hypertension    History of kidney stones     Orientation RESPIRATION BLADDER Height & Weight     Self, Time, Place  Normal Continent Weight: 79.7 kg Height:  '5\' 6"'$  (167.6 cm)  BEHAVIORAL SYMPTOMS/MOOD NEUROLOGICAL BOWEL NUTRITION STATUS      Continent Diet (heart/ carb modified--thins)  AMBULATORY STATUS COMMUNICATION OF NEEDS Skin   Limited Assist Verbally Bruising, Skin abrasions (bilateral legs with abrasions/ abdominal redness/ perineal rash)                       Personal Care Assistance Level of Assistance  Bathing, Feeding, Dressing Bathing Assistance: Limited assistance Feeding assistance: Independent Dressing Assistance: Limited assistance     Functional Limitations Info  Sight, Hearing, Speech Sight Info: Adequate Hearing Info: Adequate Speech Info: Adequate    SPECIAL CARE FACTORS FREQUENCY  PT (By licensed PT), OT (By licensed OT), Speech therapy     PT Frequency: 5x/wk OT Frequency: 5x/wk     Speech Therapy Frequency: 5x/wk      Contractures Contractures Info: Not present    Additional Factors Info  Code Status, Allergies, Psychotropic, Insulin Sliding Scale Code Status Info: full Allergies Info: cilostazol/ dulaglutide/ levofloxacin/ liraglutide/ lisinopril Psychotropic Info:  Cymbalta DR 90 mg daily/ seroquel 50 mg TID/ Insulin Sliding Scale Info: Semglee 24 units SQ daily/ Novolog 0-15 SQ TID/ Novolog 0-5 SQ HS       Current Medications (09/22/2022):  This is the current hospital active medication list Current  Facility-Administered Medications  Medication Dose Route Frequency Provider Last Rate Last Admin   acetaminophen (TYLENOL) tablet 650 mg  650 mg Oral Q6H PRN Marcelyn Bruins, MD   650 mg at 09/21/22 2332   Or   acetaminophen (TYLENOL) suppository 650 mg  650 mg Rectal Q6H PRN Marcelyn Bruins, MD       apixaban Arne Cleveland) tablet 5 mg  5 mg Oral BID Marcelyn Bruins, MD   5 mg at 09/22/22 3016   atorvastatin (LIPITOR) tablet 80 mg  80 mg Oral QPM Marcelyn Bruins, MD   80 mg at 09/21/22 1809   DULoxetine (CYMBALTA) DR capsule 90 mg  90 mg Oral Daily Hammons, Kimberly B, RPH   90 mg at 09/22/22 0109   haloperidol lactate (HALDOL) injection 1 mg  1 mg Intravenous Q6H PRN [name], MD       hydrALAZINE (APRESOLINE) injection 10 mg  10 mg Intravenous Q4H PRN Kristopher Oppenheim, DO   10 mg at 09/19/22 3235   insulin aspart (novoLOG) injection 0-15 Units  0-15 Units Subcutaneous TID WC Marcelyn Bruins, MD   3 Units at 09/22/22 5732   insulin aspart (novoLOG) injection 0-5 Units  0-5 Units Subcutaneous QHS Marcelyn Bruins, MD       insulin glargine-yfgn [facility]) injection 24 Units  24 Units Subcutaneous Daily [name], MD   24 Units at 09/22/22 0918   pantoprazole (PROTONIX) EC tablet 40 mg  40 mg Oral q AM Marcelyn Bruins, MD   40 mg at 09/22/22 2025   polyethylene glycol (MIRALAX / GLYCOLAX) packet 17 g  17 g Oral Daily PRN Marcelyn Bruins, MD       QUEtiapine (SEROQUEL) tablet 50 mg  50 mg Oral BID [name], MD   50 mg at 09/22/22 0917   sodium chloride flush (NS) 0.9 % injection 3 mL  3 mL Intravenous Q12H Marcelyn Bruins, MD   3 mL at 09/22/22 4270     Discharge Medications: Please see discharge summary for a list of discharge medications.  Relevant Imaging Results:  Relevant Lab Results:   Additional Information SS#: [id number]  Pollie Friar, RN

## 2022-09-22 NOTE — Plan of Care (Signed)
  Problem: Self-Care: Goal: Ability to communicate needs accurately will improve Outcome: Progressing   Problem: Nutrition: Goal: Risk of aspiration will decrease Outcome: Progressing Goal: Dietary intake will improve Outcome: Progressing   Problem: Fluid Volume: Goal: Ability to maintain a balanced intake and output will improve Outcome: Progressing   Problem: Education: Goal: Knowledge of disease or condition will improve Outcome: Not Progressing   Problem: Health Behavior/Discharge Planning: Goal: Ability to manage health-related needs will improve Outcome: Not Progressing

## 2022-09-22 NOTE — Progress Notes (Signed)
[facility] PROGRESS NOTE   Fernando Hess YQM:[id number] DOB: June 17, 1946 DOA: 09/17/2022  PCP: [name], MD  Brief History/Interval Summary:  77 y.o. 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 Wife noted that he had a slow and unsteady gait.  He could not figure out how to open his mailbox.  Could not figure out how to shift his car to drive up the driveway.  EMS was called.  Patient was brought into the hospital.   Consultants: Neurology  Procedures: LTM EEG    Subjective/Interval History: Patient denies any complaints this morning.  No headaches.  No nausea or vomiting.  Assessment/Plan:  Acute metabolic encephalopathy/concern for seizure activity/subacute stroke Patient seen by neurology.  MRI of the brain raised concern for subacute stroke. Patient was placed on continuous EEG.  Keppra was kept on hold. No epileptiform activity noted on EEG. LTM Discontinued.  No recommendation for antiepileptic drugs. Neurology recommends repeating MRI in about 4 weeks.  They also recommend outpatient sleep study.  They have given contact information for [facility] to his wife for follow-up. Patient on apixaban.   Echocardiogram not thought to be necessary by neurology. LDL is 66.  Continue statin. HbA1c was 14.6 in November. TSH normal.  B28 and folic acid levels were normal when checked recently. Initially PT and OT recommended home health.  However they also felt that patient needs a lot of assistance.  Wife has medical problems of her own and unable to provide assistance.  No other family members available. PT and OT evaluation.  Home health is recommended.  Wife unable to provide supervision and assistance at home due to her own health problems. Patient with cognitive deficits.   Reevaluated by physical therapy.  Found to have impaired balance.  Recommendation changed to skilled nursing facility for short-term rehab.  Agree with this recommendation. Continue with Seroquel at current dose. Mild QT prolongation noted on EKG as read by the computer.  However not that elevated when I measured it.  Less than 551m.  History of coronary artery disease He is status post CABG. Continue home medications. Cardiac status is stable.  Chronic kidney disease stage IIIb Baseline creatinine is between 1.8-2.2.  Patient is renal function stable for the most part.  Avoid nephrotoxic agents.  Monitor urine output.  Diabetes mellitus type 2, uncontrolled with hyperglycemia HbA1c 14.6 in November.  He is noted to be on glargine and SSI.  Continue to monitor CBGs.  Glargine dose was adjusted.  CBGs are better controlled. At home patient is on Basaglar 40 units every morning along with lispro 8 units 3 times a day.  Chronic diastolic CHF Last echocardiogram in 2022 showed a EF of 60 to 65% with grade 1 diastolic dysfunction. Lasix is on hold.  Paroxysmal atrial fibrillation Continue Eliquis.  Not noted to be on any rate limiting drugs.  Essential hypertension Apparently has had issues with orthostatic hypotension recently hence the reason he is not on any antihypertensives currently. Blood pressures have been stable.  Low readings noted occasionally.  Continue to monitor.  Patient has been asymptomatic.  GERD Continue PPI  History of anxiety and depression Continue duloxetine.  Peripheral artery disease Stable.  Obstructive sleep apnea Not on CPAP.  This needs to be addressed in the outpatient setting  DVT Prophylaxis: On apixaban Code Status: Full code Family  Communication: Wife being updated periodically. Disposition Plan: SNF pending.  Informed by the wife that she and patient are the only one's to get medical information and to make decisions and not the children.    Status is: Inpatient Remains inpatient appropriate because: Persistent agitation/delirium     Medications: Scheduled:  apixaban  5 mg Oral BID   atorvastatin  80 mg Oral QPM   DULoxetine  90 mg Oral Daily   insulin aspart  0-15 Units Subcutaneous TID WC   insulin aspart  0-5 Units Subcutaneous QHS   insulin glargine-yfgn  24 Units Subcutaneous Daily   pantoprazole  40 mg Oral q AM   QUEtiapine  50 mg Oral BID   sodium chloride flush  3 mL Intravenous Q12H   Continuous: [id number] **OR** acetaminophen, haloperidol lactate, hydrALAZINE, polyethylene glycol  Antibiotics: Anti-infectives (From admission, onward)    None       Objective:  Vital Signs  Vitals:   09/21/22 2332 09/22/22 0350 09/22/22 0552 09/22/22 0746  BP: (!) 146/86 (!) 152/79  (!) 87/55  Pulse: 88 (!) 104  98  Resp: '15 14  16  '$ Temp: 98.7 F (37.1 C) 98.7 F (37.1 C)  98.4 F (36.9 C)  TempSrc: Oral Oral  Oral  SpO2: 98% 99%  95%  Weight:   79.7 kg   Height:       No intake or output data in the 24 hours ending 09/22/22 1043  Filed Weights   09/20/22 0539 09/21/22 0430 09/22/22 0552  Weight: 81 kg 81 kg 79.7 kg    General appearance: Awake alert.  In no distress Resp: Clear to auscultation bilaterally.  Normal effort Cardio: S1-S2 is normal regular.  No S3-S4.  No rubs murmurs or bruit GI: Abdomen is soft.  Nontender nondistended.  Bowel sounds are present normal.  No masses organomegaly    Lab Results:  Data Reviewed: I have personally reviewed following labs and reports of the imaging studies  CBC: Recent Labs  Lab 09/17/22 1201 09/18/22 0023 09/20/22 0932  WBC 13.6* 10.5 9.3  NEUTROABS 9.0*  --   --   HGB 14.2  14.3 12.1* 12.3*  HCT 43.3  42.0 38.5* 37.5*  MCV 93.3 97.2 93.5  PLT 251 220 210     Basic Metabolic Panel: Recent Labs  Lab 09/17/22 1201 09/18/22 0023 09/20/22 0932  NA 144  143 141 138  K 4.0  4.0 3.6 3.9  CL 106  107 111 107  CO2 21* 23  25  GLUCOSE 78  79 167* 241*  BUN '20  19 23 22  '$ CREATININE 1.99*  1.90* 2.01* 1.80*  CALCIUM 9.1 8.1* 8.3*     GFR: Estimated Creatinine Clearance: 33.6 mL/min (A) (by C-G formula based on SCr of 1.8 mg/dL (H)).  Liver Function Tests: Recent Labs  Lab 09/17/22 1201 09/18/22 0023  AST 35 27  ALT 29 26  ALKPHOS 131* 108  BILITOT 1.8* 1.0  PROT 5.8* 4.7*  ALBUMIN 3.0* 2.4*      Recent Labs  Lab 09/17/22 1541  AMMONIA 14     Coagulation Profile: Recent Labs  Lab 09/17/22 1201  INR 1.5*     CBG: Recent Labs  Lab 09/21/22 0615 09/21/22 1231 09/21/22 1631 09/21/22 2222 09/22/22 0606  GLUCAP 240* 267* 172* 110* 154*     Recent Results (from the past 240 hour(s))  Resp panel by RT-PCR (RSV, Flu A&B, Covid) Anterior Nasal Swab     Status: None  Collection Time: 09/17/22  3:00 PM   Specimen: Anterior Nasal Swab  Result Value Ref Range Status   SARS Coronavirus 2 by RT PCR NEGATIVE NEGATIVE Final    Comment: (NOTE) SARS-CoV-2 target nucleic acids are NOT DETECTED.  The SARS-CoV-2 RNA is generally detectable in upper respiratory specimens during the acute phase of infection. The lowest concentration of SARS-CoV-2 viral copies this assay can detect is 138 copies/mL. A negative result does not preclude SARS-Cov-2 infection and should not be used as the sole basis for treatment or other patient management decisions. A negative result may occur with  improper specimen collection/handling, submission of specimen other than nasopharyngeal swab, presence of viral mutation(s) within the areas targeted by this assay, and inadequate number of viral copies(<138 copies/mL). A negative result must be combined with clinical observations, patient history, and epidemiological information. The expected result is Negative.  Fact Sheet for Patients:  EntrepreneurPulse.com.au  Fact Sheet for Healthcare Providers:   IncredibleEmployment.be  This test is no t yet approved or cleared by the [place] FDA and  has been authorized for detection and/or diagnosis of SARS-CoV-2 by FDA under an Emergency Use Authorization (EUA). This EUA will remain  in effect (meaning this test can be used) for the duration of the COVID-19 declaration under Section 564(b)(1) of the Act, 21 U.S.C.section 360bbb-3(b)(1), unless the authorization is terminated  or revoked sooner.       Influenza A by PCR NEGATIVE NEGATIVE Final   Influenza B by PCR NEGATIVE NEGATIVE Final    Comment: (NOTE) The Xpert Xpress SARS-CoV-2/FLU/RSV plus assay is intended as an aid in the diagnosis of influenza from Nasopharyngeal swab specimens and should not be used as a sole basis for treatment. Nasal washings and aspirates are unacceptable for Xpert Xpress SARS-CoV-2/FLU/RSV testing.  Fact Sheet for Patients: EntrepreneurPulse.com.au  Fact Sheet for Healthcare Providers: IncredibleEmployment.be  This test is not yet approved or cleared by the [place] FDA and has been authorized for detection and/or diagnosis of SARS-CoV-2 by FDA under an Emergency Use Authorization (EUA). This EUA will remain in effect (meaning this test can be used) for the duration of the COVID-19 declaration under Section 564(b)(1) of the Act, 21 U.S.C. section 360bbb-3(b)(1), unless the authorization is terminated or revoked.     Resp Syncytial Virus by PCR NEGATIVE NEGATIVE Final    Comment: (NOTE) Fact Sheet for Patients: EntrepreneurPulse.com.au  Fact Sheet for Healthcare Providers: IncredibleEmployment.be  This test is not yet approved or cleared by the [place] FDA and has been authorized for detection and/or diagnosis of SARS-CoV-2 by FDA under an Emergency Use Authorization (EUA). This EUA will remain in effect (meaning this test can be used) for  the duration of the COVID-19 declaration under Section 564(b)(1) of the Act, 21 U.S.C. section 360bbb-3(b)(1), unless the authorization is terminated or revoked.  Performed at [facility], [place] [address] [place], [place] [address]       Radiology Studies: No results found.     LOS: 4 days   Roxann Vierra [employer] on [email or web address]  09/22/2022, 10:43 AM

## 2022-09-22 NOTE — Progress Notes (Addendum)
Physical Therapy Treatment Patient Details Name: Fernando Hess MRN: [id number] DOB: 03-06-1946 Today's Date: 09/22/2022   History of Present Illness 77 y.o. male admitted with confusion and some difficulty with gait.  MRI with small focus of hyperintense signal on diffusion-weighted imaging in the right occipital lobe is new compared to prior MRI in November. Developed confusion and agitation requiring restraints. 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    PT Comments    Patient with decline in mental status since PT evaluation (see prior notes re: confusion and agitation). Today he presented with more impaired balance with Dynamic Gait Index 13/24 with <19 indicative of increased fall risk. Per chart, pt had several falls in weeks leading up to hospitalization. To minimize his risk of falling with potential life-threatening injuries, recommend continued PT in SNF setting.     Recommendations for follow up therapy are one component of a multi-disciplinary discharge planning process, led by the attending physician.  Recommendations may be updated based on patient status, additional functional criteria and insurance authorization.  Follow Up Recommendations  Skilled nursing-short term rehab (<3 hours/day) (to resume) Can patient physically be transported by private vehicle: Yes   Assistance Recommended at Discharge Frequent or constant Supervision/Assistance  Patient can return home with the following Help with stairs or ramp for entrance;Assistance with cooking/housework;A little help with walking and/or transfers;Direct supervision/assist for medications management;Direct supervision/assist for financial management;Assist for transportation   Equipment Recommendations  None recommended by PT    Recommendations for Other Services       Precautions /  Restrictions Precautions Precautions: Fall Precaution Comments: two falls over past 3 weeks since d/c Restrictions Weight Bearing Restrictions: No     Mobility  Bed Mobility Overal bed mobility: Needs Assistance Bed Mobility: Supine to Sit     Supine to sit: Supervision     General bed mobility comments: iincreased time and use of bed rails    Transfers Overall transfer level: Needs assistance Equipment used: None Transfers: Sit to/from Stand Sit to Stand: Min guard           General transfer comment: min guard for safety with transfers due to recent h/o falls    Ambulation/Gait Ambulation/Gait assistance: Min assist Gait Distance (Feet): 240 Feet Assistive device: None Gait Pattern/deviations: Step-through pattern, Decreased stride length, Shuffle, Drifts right/left, Wide base of support   Gait velocity interpretation: <1.8 ft/sec, indicate of risk for recurrent falls   General Gait Details: pt with drift to his left, very short steps with poor foot clearance; unable to incr velocity;   Stairs Stairs: Yes Stairs assistance: Min guard Stair Management: One rail Right, Two rails, Alternating pattern, Forwards Number of Stairs: 5 General stair comments: minguard for safety due to loss of balance with pre-stair activity   Wheelchair Mobility    Modified Rankin (Stroke Patients Only) Modified Rankin (Stroke Patients Only) Pre-Morbid Rankin Score: Slight disability Modified Rankin: Moderately severe disability     Balance Overall balance assessment: Needs assistance Sitting-balance support: No upper extremity supported Sitting balance-Leahy Scale: Good     Standing balance support: No upper extremity supported, Bilateral upper extremity supported Standing balance-Leahy Scale: Fair Standing balance comment: able to stand at sink with no UE support                 Standardized Balance Assessment Standardized Balance Assessment : Dynamic Gait  Index   Dynamic Gait  Index Level Surface: Moderate Impairment Change in Gait Speed: Moderate Impairment Gait with Horizontal Head Turns: Mild Impairment Gait with Vertical Head Turns: Mild Impairment Gait and Pivot Turn: Normal Step Over Obstacle: Mild Impairment Step Around Obstacles: Severe Impairment Steps: Mild Impairment Total Score: 13      Cognition Arousal/Alertness: Awake/alert Behavior During Therapy: WFL for tasks assessed/performed Overall Cognitive Status: Impaired/Different from baseline Area of Impairment: Attention, Safety/judgement, Problem solving, Memory, Following commands, Awareness                 Orientation Level:  (NT) Current Attention Level: Sustained Memory: Decreased short-term memory Following Commands: Follows multi-step commands inconsistently, Follows one step commands with increased time Safety/Judgement: Decreased awareness of safety, Decreased awareness of deficits Awareness:  (pre-intellectual) Problem Solving: Slow processing General Comments: able to follow directions; states his gait is not his normal but cannot specify how it is different (pt walking very slowly with wide BOS--when mentioned he states that is correct, but he cannot improve gait pattern)        Exercises      General Comments        Pertinent Vitals/Pain Pain Assessment Pain Assessment: No/denies pain    Home Living                          Prior Function            PT Goals (current goals can now be found in the care plan section) Acute Rehab PT Goals Patient Stated Goal: to go home PT Goal Formulation: With patient Time For Goal Achievement: 10/02/22 Potential to Achieve Goals: Good Progress towards PT goals: Not progressing toward goals - comment (pt with incr confusion/agitation episodes since last PT session; balance and gait more impaired compared to on evaluation)    Frequency    Min 2X/week      PT Plan Discharge plan  needs to be updated;Frequency needs to be updated    Co-evaluation              AM-PAC PT "6 Clicks" Mobility   Outcome Measure  Help needed turning from your back to your side while in a flat bed without using bedrails?: None Help needed moving from lying on your back to sitting on the side of a flat bed without using bedrails?: A Little Help needed moving to and from a bed to a chair (including a wheelchair)?: A Little Help needed standing up from a chair using your arms (e.g., wheelchair or bedside chair)?: A Little Help needed to walk in hospital room?: A Little Help needed climbing 3-5 steps with a railing? : A Little 6 Click Score: 19    End of Session Equipment Utilized During Treatment: Gait belt Activity Tolerance: Patient tolerated treatment well Patient left: with call bell/phone within reach;in chair;with chair alarm set Nurse Communication: Mobility status;Other (comment) (updated discharge plan) PT Visit Diagnosis: Other symptoms and signs involving the nervous system (R29.898);Other abnormalities of gait and mobility (R26.89)     Time: 9628-3662 PT Time Calculation (min) (ACUTE ONLY): 16 min  Charges:  $Gait Training: 8-22 mins                      [place]  Office [phone]    Rexanne Mano 09/22/2022, 9:58 AM

## 2022-09-22 NOTE — Progress Notes (Signed)
Pt reported that he is missing a watch and a life-alert type device.  Pt reports that a nurse named "Cristy", took it off of him when she tied him up.  Pt daughter in the room when pt reported this and pt daughter stated that she thinks he had on 2 rings and the pt currently only has 1 on.  No pt belongings on unit-Nurse checked drawer in nutrition room and paper chart. Nurse (Aayansh Codispoti) offered for security to come to room if patient wanted to make a report on the situation. Pt declined with daughter in room. Pt's daughter denied that any family members could have taken items home. [place]

## 2022-09-22 NOTE — Progress Notes (Signed)
Re: Fernando Hess DOB: 11-21-1945 Date:09/22/2022   To Whom It May Concern:  Please be advised that the above-named patient will require a short-term nursing home stay--anticipated 30 days or less for rehabilitation and strengthening. The plan is for home.

## 2022-09-23 ENCOUNTER — Other Ambulatory Visit (HOSPITAL_COMMUNITY): Payer: Self-pay

## 2022-09-23 LAB — GLUCOSE, CAPILLARY
Glucose-Capillary: 216 mg/dL — ABNORMAL HIGH (ref 70–99)
Glucose-Capillary: 210 mg/dL — ABNORMAL HIGH (ref 70–99)
Glucose-Capillary: 196 mg/dL — ABNORMAL HIGH (ref 70–99)
Glucose-Capillary: 132 mg/dL — ABNORMAL HIGH (ref 70–99)

## 2022-09-23 MED ORDER — APIXABAN 5 MG PO TABS
5.0000 mg | ORAL_TABLET | Freq: Two times a day (BID) | ORAL | 0 refills | Status: DC
  Filled 2022-09-23 (×2): qty 60, 30d supply, fill #0

## 2022-09-23 MED ORDER — QUETIAPINE FUMARATE 50 MG PO TABS: 50.0000 mg | ORAL_TABLET | Freq: Two times a day (BID) | ORAL | Status: DC

## 2022-09-23 MED ORDER — APIXABAN 5 MG PO TABS: 5.0000 mg | ORAL_TABLET | Freq: Two times a day (BID) | ORAL | 0 refills | Status: DC

## 2022-09-23 NOTE — Discharge Summary (Signed)
[facility]  Physician Discharge Summary   Patient ID: Fernando Hess MRN: [id number] DOB/AGE: Dec 01, 1945 77 y.o.  Admit date: 09/17/2022 Discharge date:   09/23/2022   PCP: [name], MD  DISCHARGE DIAGNOSES:    Acute encephalopathy   Hyperlipidemia LDL goal <70   Essential hypertension   Coronary artery disease involving native coronary artery of native heart without angina pectoris   S/P CABG x 5   Peripheral artery disease   OSA on CPAP   Chronic heart failure with preserved ejection fraction (HFpEF) ([name])   Paroxysmal atrial fibrillation ([name])   Diabetes mellitus, type 2 ([name])   Anxiety   Gastro-esophageal reflux disease without esophagitis   Chronic kidney disease, stage 3b ([place])   Myasthenia gravis ([place])   RECOMMENDATIONS FOR OUTPATIENT FOLLOW UP: Patient needs to undergo brain MRI in 4 to 8 weeks as per neurology Patient needs to follow-up with [facility] in 6 to 8 weeks. Check CBC and basic metabolic panel in 3 to 4 days    Home Health: SNF Equipment/Devices: None  CODE STATUS: Full code  DISCHARGE CONDITION: fair  Diet recommendation: Heart healthy modified carbohydrate  INITIAL HISTORY: 77 y.o. 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 Wife noted that he had a slow and unsteady gait.  He could not figure out how to open his mailbox.  Could not figure out how to shift his car to drive up the driveway.  EMS was called.  Patient was brought into the hospital.    Consultants: Neurology   Procedures: LTM EEG   HOSPITAL COURSE:   Acute metabolic encephalopathy/concern for seizure activity/subacute stroke Patient seen by neurology.  MRI of the brain raised concern for subacute stroke. Patient was placed on continuous EEG.  No epileptiform  activity noted on EEG. LTM Discontinued.  No recommendation for antiepileptic drugs. Neurology recommends repeating MRI in about 4 weeks.  They also recommend outpatient sleep study.  They have given contact information for [facility] to his wife for follow-up. Patient on apixaban.   Echocardiogram not thought to be necessary by neurology. LDL is 66.  Continue statin. HbA1c was 14.6 in November. TSH normal.  I68 and folic acid levels were normal when checked recently. Patient with cognitive deficits.  Reevaluated by physical therapy.  Found to have impaired balance.  Recommendation changed to skilled nursing facility for short-term rehab.  Agree with this recommendation. Continue with Seroquel at current dose.   History of coronary artery disease He is status post CABG. Continue home medications. Cardiac status is stable.   Chronic kidney disease stage IIIb Baseline creatinine is between 1.8-2.2.  Patient is renal function stable for the most part.  Avoid nephrotoxic agents.  Check labs in 3 to 4 days.   Diabetes mellitus type 2, uncontrolled with hyperglycemia HbA1c 14.6 in November.  Continue Basaglar and lispro as before.  Monitor CBGs and adjust insulin dosage as appropriate.     Chronic diastolic CHF Last echocardiogram in 2022 showed a EF of 60 to 65% with grade 1 diastolic dysfunction. Volume status is stable.  Not on any diuretics currently.   Paroxysmal atrial fibrillation Continue Eliquis.  Not noted to be on any rate limiting drugs.   Essential hypertension Apparently has had issues with orthostatic hypotension recently hence the reason he is not on any antihypertensives currently. Blood pressures  have been stable.    GERD Continue PPI   History of anxiety and depression Continue duloxetine.   Peripheral artery disease Stable.   Obstructive sleep apnea Not on CPAP.  This needs to be addressed in the outpatient setting    Patient is stable.   Okay for discharge to SNF when bed is available.  PERTINENT LABS:  The results of significant diagnostics from this hospitalization (including imaging, microbiology, ancillary and laboratory) are listed below for reference.    Microbiology: Recent Results (from the past 240 hour(s))  Resp panel by RT-PCR (RSV, Flu A&B, Covid) Anterior Nasal Swab     Status: None   Collection Time: 09/17/22  3:00 PM   Specimen: Anterior Nasal Swab  Result Value Ref Range Status   SARS Coronavirus 2 by RT PCR NEGATIVE NEGATIVE Final    Comment: (NOTE) SARS-CoV-2 target nucleic acids are NOT DETECTED.  The SARS-CoV-2 RNA is generally detectable in upper respiratory specimens during the acute phase of infection. The lowest concentration of SARS-CoV-2 viral copies this assay can detect is 138 copies/mL. A negative result does not preclude SARS-Cov-2 infection and should not be used as the sole basis for treatment or other patient management decisions. A negative result may occur with  improper specimen collection/handling, submission of specimen other than nasopharyngeal swab, presence of viral mutation(s) within the areas targeted by this assay, and inadequate number of viral copies(<138 copies/mL). A negative result must be combined with clinical observations, patient history, and epidemiological information. The expected result is Negative.  Fact Sheet for Patients:  EntrepreneurPulse.com.au  Fact Sheet for Healthcare Providers:  IncredibleEmployment.be  This test is no t yet approved or cleared by the [place] FDA and  has been authorized for detection and/or diagnosis of SARS-CoV-2 by FDA under an Emergency Use Authorization (EUA). This EUA will remain  in effect (meaning this test can be used) for the duration of the COVID-19 declaration under Section 564(b)(1) of the Act, 21 U.S.C.section 360bbb-3(b)(1), unless the authorization is terminated  or  revoked sooner.       Influenza A by PCR NEGATIVE NEGATIVE Final   Influenza B by PCR NEGATIVE NEGATIVE Final    Comment: (NOTE) The Xpert Xpress SARS-CoV-2/FLU/RSV plus assay is intended as an aid in the diagnosis of influenza from Nasopharyngeal swab specimens and should not be used as a sole basis for treatment. Nasal washings and aspirates are unacceptable for Xpert Xpress SARS-CoV-2/FLU/RSV testing.  Fact Sheet for Patients: EntrepreneurPulse.com.au  Fact Sheet for Healthcare Providers: IncredibleEmployment.be  This test is not yet approved or cleared by the [place] FDA and has been authorized for detection and/or diagnosis of SARS-CoV-2 by FDA under an Emergency Use Authorization (EUA). This EUA will remain in effect (meaning this test can be used) for the duration of the COVID-19 declaration under Section 564(b)(1) of the Act, 21 U.S.C. section 360bbb-3(b)(1), unless the authorization is terminated or revoked.     Resp Syncytial Virus by PCR NEGATIVE NEGATIVE Final    Comment: (NOTE) Fact Sheet for Patients: EntrepreneurPulse.com.au  Fact Sheet for Healthcare Providers: IncredibleEmployment.be  This test is not yet approved or cleared by the [place] FDA and has been authorized for detection and/or diagnosis of SARS-CoV-2 by FDA under an Emergency Use Authorization (EUA). This EUA will remain in effect (meaning this test can be used) for the duration of the COVID-19 declaration under Section 564(b)(1) of the Act, 21 U.S.C. section 360bbb-3(b)(1), unless the authorization is terminated or revoked.  Performed  at [facility], [place] [address], [place], [place] [address]      Labs:   Basic Metabolic Panel: Recent Labs  Lab 09/17/22 1201 09/18/22 0023 09/20/22 0932  NA 144  143 141 138  K 4.0  4.0 3.6 3.9  CL 106  107 111 107  CO2 21* 23 25  GLUCOSE 78  79 167*  241*  BUN '20  19 23 22  '$ CREATININE 1.99*  1.90* 2.01* 1.80*  CALCIUM 9.1 8.1* 8.3*   Liver Function Tests: Recent Labs  Lab 09/17/22 1201 09/18/22 0023  AST 35 27  ALT 29 26  ALKPHOS 131* 108  BILITOT 1.8* 1.0  PROT 5.8* 4.7*  ALBUMIN 3.0* 2.4*    Recent Labs  Lab 09/17/22 1541  AMMONIA 14   CBC: Recent Labs  Lab 09/17/22 1201 09/18/22 0023 09/20/22 0932  WBC 13.6* 10.5 9.3  NEUTROABS 9.0*  --   --   HGB 14.2  14.3 12.1* 12.3*  HCT 43.3  42.0 38.5* 37.5*  MCV 93.3 97.2 93.5  PLT 251 220 210      CBG: Recent Labs  Lab 09/22/22 0606 09/22/22 1148 09/22/22 1552 09/22/22 2135 09/23/22 0929  GLUCAP 154* 231* 274* 148* 216*     IMAGING STUDIES Overnight EEG with video  Result Date: 09/19/2022 Lora Havens, MD     09/20/2022  7:01 AM Patient Name: Fernando Hess MRN: [id number] Epilepsy Attending: Lora Havens Referring Physician/Provider: Lorenza Chick, MD Duration: 09/18/2022 1405 to 09/19/2022 1405 Patient history: 77 year old male with an episode of abnormal gait and confusion. EEG to evaluate for seizure. Level of alertness: Awake, asleep AEDs during EEG study: LEV Technical aspects: This EEG study was done with scalp electrodes positioned according to the 10-20 International system of electrode placement. Electrical activity was reviewed with band pass filter of 1-'70Hz'$ , sensitivity of 7 uV/mm, display speed of 38m/sec with a '60Hz'$  notched filter applied as appropriate. EEG data were recorded continuously and digitally stored.  Video monitoring was available and reviewed as appropriate. Description: The posterior dominant rhythm consists of 8 Hz activity of moderate voltage (25-35 uV) seen predominantly in posterior head regions, symmetric and reactive to eye opening and eye closing. Sleep was characterized by vertex waves, sleep spindles (12 to 14 Hz), maximal frontocentral region. Hyperventilation and photic stimulation were not performed.   IMPRESSION:  This study is within normal limits. No seizures or epileptiform discharges were seen throughout the recording. A normal interictal EEG does not exclude the diagnosis of epilepsy. PLora Havens  EEG adult  Result Date: 09/18/2022 YLora Havens MD     09/18/2022  8:45 AM Patient Name: Fernando SAMEKMRN: [id number] Attending: PLora HavensReferring Physician/Provider: MMarcelyn Bruins MD Date: 09/17/2022 Duration: 26.58 mins Patient history: 77year old male with an episode of abnormal gait and confusion.  EEG to evaluate for seizure. Level of alertness: Awake AEDs during EEG study: None Technical aspects: This EEG study was done with scalp electrodes positioned according to the 10-20 International system of electrode placement. Electrical activity was reviewed with band pass filter of 1-'70Hz'$ , sensitivity of 7 uV/mm, display speed of 347msec with a '60Hz'$  notched filter applied as appropriate. EEG data were recorded continuously and digitally stored.  Video monitoring was available and reviewed as appropriate. Description: The posterior dominant rhythm consists of 8 Hz activity of moderate voltage (25-35 uV) seen predominantly in posterior head regions, symmetric and reactive to eye opening and  eye closing. Drowsiness was characterized by attenuation of the posterior background rhythm. Sleep was characterized by vertex waves, sleep spindles (12 to 14 Hz), maximal frontocentral region. EEG showed intermittent right > left posterior quadrant 2 to 3 Hz delta slowing. Hyperventilation did not show any EEG change.  Physiologic photic driving was not seen during photic stimulation.  ABNORMALITY -Intermittent slow, right > left posterior quadrant IMPRESSION: This study is suggestive of cortical dysfunction arising from right more than left posterior quadrant, nonspecific etiology. No seizures or epileptiform discharges were seen throughout the recording. Please note that lack of epileptiform activity  does not exclude the diagnosis of epilepsy. Lora Havens   DG Chest Portable 1 View  Result Date: 09/17/2022 CLINICAL DATA:  Altered mental status. EXAM: PORTABLE CHEST 1 VIEW COMPARISON:  September 04, 2022. FINDINGS: Stable cardiomediastinal silhouette. Status post coronary bypass graft. Both lungs are clear. The visualized skeletal structures are unremarkable. IMPRESSION: No active disease. Electronically Signed   By: Marijo Conception M.D.   On: 09/17/2022 15:20   MR BRAIN W WO CONTRAST  Result Date: 09/17/2022 CLINICAL DATA:  Stroke suspected EXAM: MRI HEAD WITHOUT AND WITH CONTRAST TECHNIQUE: Multiplanar, multiecho pulse sequences of the brain and surrounding structures were obtained without and with intravenous contrast. CONTRAST:  41m GADAVIST GADOBUTROL 1 MMOL/ML IV SOLN COMPARISON:  Same day CT head/neck angiogram.  MRI Head 08/12/22 FINDINGS: Brain: Small focus of hyperintense signal in the right occipital lobe on diffusion-weighted imaging (series 3, image 21). This is new compared to prior MRI brain dated 08/12/2022, but without a definite correlate on the ADC map. There is also contrast enhancement in this region (series 14, image 21), which is suggestive of a subacute infarct. Sequela of moderate chronic microvascular ischemic change. No evidence of hemorrhage. No hydrocephalus. No extra-axial fluid collection. There is asymmetric contrast enhancement of the mastoid segment of the right facial nerve (series 14, image 10). There is an additional focus of contrast enhancement in the medial aspect of the right occipital lobe (series 14, image 20), which is also favored to represent an additional site a subacute infarct. Vascular: V4 segment of the left vertebral artery demonstrates decreased flow signal. Please see separately dictated same day CTA head and neck angiogram for additional vascular findings. Skull and upper cervical spine: Normal marrow signal. Sinuses/Orbits: Bilateral lens  replacement. Mucosal thickening bilateral maxillary, ethmoid, and frontal sinuses. Trace right mastoid effusion. Other: None IMPRESSION: 1. Small focus of hyperintense signal on diffusion-weighted imaging in the right occipital lobe is new compared to prior MRI brain dated 08/12/2022, but without a definite correlate on the ADC map. There is also contrast enhancement in this region, suggestive of a subacute infarct. Additional focus of contrast enhancement in the medial aspect of the right occipital lobe, which is also favored to represent an additional site of a subacute infarct. Recommend repeat contrast enhanced brain MRI in 4 weeks to assess for interval change. 2. Asymmetric contrast enhancement of the mastoid segment of the right facial nerve, which is nonspecific but can be seen in the setting of Bell's palsy. Electronically Signed   By: HMarin RobertsM.D.   On: 09/17/2022 15:03   CT ANGIO HEAD NECK W WO CM (CODE STROKE)  Result Date: 09/17/2022 CLINICAL DATA:  Neuro deficit, acute, stroke suspected. EXAM: CT ANGIOGRAPHY HEAD AND NECK TECHNIQUE: Multidetector CT imaging of the head and neck was performed using the standard protocol during bolus administration of intravenous contrast. Multiplanar CT image reconstructions and  MIPs were obtained to evaluate the vascular anatomy. Carotid stenosis measurements (when applicable) are obtained utilizing NASCET criteria, using the distal internal carotid diameter as the denominator. RADIATION DOSE REDUCTION: This exam was performed according to the departmental dose-optimization program which includes automated exposure control, adjustment of the mA and/or kV according to patient size and/or use of iterative reconstruction technique. CONTRAST:  1m OMNIPAQUE IOHEXOL 350 MG/ML SOLN COMPARISON:  None Available. FINDINGS: CTA NECK FINDINGS Aortic arch: Normal variant 4 vessel aortic arch with the left vertebral artery arising directly from the arch. Minimal  atherosclerosis without significant stenosis of the arch vessel origins. Right carotid system: Patent with mild calcified and soft plaque in the carotid bulb. No evidence of a significant stenosis or dissection. Left carotid system: Patent with a small to moderate amount of predominantly calcified plaque at the carotid bifurcation extending into the carotid bulb without significant associated stenosis. Additional soft plaque near the distal aspect of the carotid bulb results in 50% ICA stenosis. Vertebral arteries: The vertebral arteries are patent in the neck with the right being strongly dominant. Scattered atherosclerotic plaque throughout the right vertebral artery results in up to moderate V1 and mild V3 stenosis. Skeleton: Grade 1 anterolisthesis of C3 on C4 and grade 1 retrolisthesis of C4 on C5. Asymmetrically advanced left facet arthrosis at C2-3 and C3-4. Other neck: No evidence of cervical lymphadenopathy or mass. Upper chest: Clear lung apices. Review of the MIP images confirms the above findings CTA HEAD FINDINGS Anterior circulation: The internal carotid arteries are patent from skull base to carotid termini with mild cavernous and paraclinoid stenoses bilaterally as well as a mild-to-moderate distal right petrous stenosis. ACAs and MCAs are patent without evidence of a proximal branch occlusion or significant proximal stenosis. No aneurysm is identified. Posterior circulation: The intracranial right vertebral artery is patent with moderate multifocal stenoses due to prominent atherosclerotic plaque. The intracranial left vertebral artery is small and patent proximally but is occluded distal to the PICA origin. The basilar artery is patent and congenitally small with mild diffuse atherosclerotic irregularity. There are large posterior communicating arteries bilaterally with asymmetrically prominent hypoplasia of the left P1 segment. Both PCAs are patent with mild atherosclerotic irregularity of the  branch vessels but no evidence of a flow limiting proximal stenosis. No aneurysm is identified. Venous sinuses: As permitted by contrast timing, patent. Anatomic variants: Fetal left PCA. Review of the MIP images confirms the above findings These results were communicated to Dr. BCurly Shoresat 12:21 pm on 09/17/2022 by text page via the [facility]. IMPRESSION: 1. 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 2. Patent and dominant right vertebral artery with moderate proximal and distal stenoses. 3. Mild-to-moderate intracranial ICA stenoses. 4. 50% proximal left ICA stenosis. 5.  Aortic Atherosclerosis (ICD10-I70.0). Electronically Signed   By: ALogan BoresM.D.   On: 09/17/2022 12:42   CT HEAD CODE STROKE WO CONTRAST  Result Date: 09/17/2022 CLINICAL DATA:  Code stroke. Neuro deficit, acute, stroke suspected. EXAM: CT HEAD WITHOUT CONTRAST TECHNIQUE: Contiguous axial images were obtained from the base of the skull through the vertex without intravenous contrast. RADIATION DOSE REDUCTION: This exam was performed according to the departmental dose-optimization program which includes automated exposure control, adjustment of the mA and/or kV according to patient size and/or use of iterative reconstruction technique. COMPARISON:  Head CT and MRI 08/12/2022 FINDINGS: Brain: There is no evidence of an acute supratentorial infarct, intracranial hemorrhage, mass, midline shift, or extra-axial fluid collection. Motion and  streak artifact limited assessment of the posterior fossa. There is mild cerebral atrophy. Hypodensities in the cerebral white matter bilaterally are unchanged and nonspecific but compatible with mild chronic small vessel ischemic disease. Vascular: Calcified atherosclerosis at the skull base. No hyperdense vessel. Skull: No fracture or suspicious osseous lesion is identified within limitations of motion artifact. Sinuses/Orbits: Mucous retention cysts in the maxillary sinuses.  Clear mastoid air cells. Bilateral cataract extraction. Other: None. ASPECTS [facility] Stroke Program Early CT Score) - Ganglionic level infarction (caudate, lentiform nuclei, internal capsule, insula, M1-M3 cortex): 7 - Supraganglionic infarction (M4-M6 cortex): 3 Total score (0-10 with 10 being normal): 10 IMPRESSION: 1. No evidence of acute intracranial abnormality. Limited assessment of the posterior fossa. 2. ASPECTS of 10. 3. Mild chronic small vessel ischemic disease. These results were communicated to Dr. Curly Shores at 12:16 pm on 09/17/2022 by text page via the [facility] messaging system. Electronically Signed   By: Logan Bores M.D.   On: 09/17/2022 12:22   DG Chest Portable 1 View  Result Date: 09/04/2022 CLINICAL DATA:  Weakness, shortness of breath EXAM: PORTABLE CHEST 1 VIEW COMPARISON:  06/17/2022 FINDINGS: Cardiomegaly status post median sternotomy and CABG. Both lungs are clear. The visualized skeletal structures are unremarkable. IMPRESSION: Cardiomegaly without acute abnormality of the lungs in AP portable projection. Electronically Signed   By: Delanna Ahmadi M.D.   On: 09/04/2022 16:11    DISCHARGE EXAMINATION: Vitals:   09/22/22 1931 09/23/22 0100 09/23/22 0510 09/23/22 0746  BP: 134/67 126/66 107/62 (!) 155/84  Pulse: (!) 46 91 93 92  Resp: '17 18 17 18  '$ Temp: 99 F (37.2 C) 97.8 F (36.6 C) 97.8 F (36.6 C) 98 F (36.7 C)  TempSrc: Oral Oral  Oral  SpO2: 94%  95% 96%  Weight:      Height:       General appearance: Awake alert.  In no distress Resp: Clear to auscultation bilaterally.  Normal effort Cardio: S1-S2 is normal regular.  No S3-S4.  No rubs murmurs or bruit GI: Abdomen is soft.  Nontender nondistended.  Bowel sounds are present normal.  No masses organomegaly   DISPOSITION: SNF  Discharge Instructions     Call MD for:  difficulty breathing, headache or visual disturbances   Complete by: As directed    Call MD for:  extreme fatigue   Complete by: As directed     Call MD for:  persistant dizziness or light-headedness   Complete by: As directed    Call MD for:  persistant nausea and vomiting   Complete by: As directed    Call MD for:  severe uncontrolled pain   Complete by: As directed    Call MD for:  temperature >100.4   Complete by: As directed    Diet - low sodium heart healthy   Complete by: As directed    Discharge instructions   Complete by: As directed    Please review instructions on the discharge summary.  You were cared for by a hospitalist during your hospital stay. If you have any questions about your discharge medications or the care you received while you were in the hospital after you are discharged, you can call the unit and asked to speak with the hospitalist on call if the hospitalist that took care of you is not available. Once you are discharged, your primary care physician will handle any further medical issues. Please note that NO REFILLS for any discharge medications will be authorized once you are discharged,  as it is imperative that you return to your primary care physician (or establish a relationship with a primary care physician if you do not have one) for your aftercare needs so that they can reassess your need for medications and monitor your lab values. If you do not have a primary care physician, you can call [phone] for a physician referral.   Increase activity slowly   Complete by: As directed           Allergies as of 09/23/2022       Reactions   Cilostazol Swelling, Other (See Comments)   Edema   Dulaglutide Nausea And Vomiting, Other (See Comments)   TRULICITY   Levofloxacin Hives, Itching, Rash   Liraglutide Other (See Comments)   Severe fatigue & insomnia   Lisinopril Itching, Rash, Cough        Medication List     STOP taking these medications    Benadryl Allergy 25 mg capsule Generic drug: diphenhydrAMINE   zaleplon 5 MG capsule Commonly known as: [name]       TAKE these medications     acetaminophen 325 MG tablet Commonly known as: TYLENOL Take 325-650 mg by mouth every 6 (six) hours as needed for headache or mild pain.   apixaban 5 MG Tabs tablet Commonly known as: Eliquis Take 1 tablet (5 mg total) by mouth in the morning and at bedtime.   atorvastatin 80 MG tablet Commonly known as: LIPITOR Take 1 tablet (80 mg total) by mouth daily. What changed: when to take this   B-D UF III MINI PEN NEEDLES 31G X 5 MM Misc Generic drug: Insulin Pen Needle USE AS DIRECTED WITH LANTUS SOLOSTAR   Basaglar KwikPen 100 UNIT/ML Inject 40 Units into the skin in the morning.   DULoxetine 30 MG capsule Commonly known as: CYMBALTA Take 30 mg by mouth every morning. Take along with 60 mg capsule=90 mg   DULoxetine 60 MG capsule Commonly known as: CYMBALTA Take 60 mg by mouth every morning. Take along with 30 mg capsule=90 mg   FreeStyle Libre 2 Sensor Misc Inject 1 Device into the skin every 14 (fourteen) days.   guaiFENesin-codeine 100-10 MG/5ML syrup Commonly known as: ROBITUSSIN AC take 59m BY MOUTH EVERY 4 HOURS AS NEEDED What changed:  how much to take how to take this when to take this reasons to take this additional instructions   insulin lispro 100 UNIT/ML KwikPen Commonly known as: HUMALOG Inject 8 Units into the skin 3 (three) times daily with meals.   MAGNESIUM PO Take 350 mg by mouth every evening.   nitroGLYCERIN 0.4 MG SL tablet Commonly known as: NITROSTAT Place 0.4 mg under the tongue every 5 (five) minutes as needed for chest pain.   nystatin cream Commonly known as: MYCOSTATIN Apply 1 Application topically 2 (two) times daily as needed for dry skin. Apply to the groin in the morning and evening when not using the nystatin powder   nystatin powder Commonly known as: MYCOSTATIN/NYSTOP Apply 1 Application topically 2 (two) times daily as needed (skin irritation). Apply to the groin in the morning and evening   ondansetron 4 MG  disintegrating tablet Commonly known as: ZOFRAN-ODT Take 4 mg by mouth every 8 (eight) hours as needed for nausea or vomiting (dissolve orally).   pantoprazole 40 MG tablet Commonly known as: PROTONIX TAKE 1 TABLET BY MOUTH EVERY DAY What changed: when to take this   polyethylene glycol 17 g packet Commonly known as: MIRALAX / GLYCOLAX  Take 17 g by mouth daily as needed for mild constipation.   QUEtiapine 50 MG tablet Commonly known as: SEROQUEL Take 1 tablet (50 mg total) by mouth 2 (two) times daily.   SENIOR MULTIVITAMIN PLUS PO Take 1 tablet by mouth daily with breakfast.          Contact information for follow-up providers     [name], MD. Schedule an appointment as soon as possible for a visit in 1 week(s).   Specialty: Family Medicine Why: post hospitalization follow up Contact information: [place] [address] [phone]         Garvin Fila, MD Follow up.   Specialties: Neurology, Radiology Why: post hospitalization follow up Contact information: [place] [address] [phone]              Contact information for after-discharge care     Destination     [facility]erred [facility] .   Service: Skilled Nursing Contact information: [address] [place] [phone]                     TOTAL DISCHARGE TIME: 33 mins  [facility] Pager on [email or web address]  09/23/2022, 9:41 AM

## 2022-09-23 NOTE — TOC Progression Note (Signed)
Transition of Care [facility]) - Progression Note    Patient Details  Name: Fernando Hess MRN: [id number] Date of Birth: 28-Aug-1946  Transition of Care [facility]) CM/SW Contact  Pollie Friar, RN Phone Number: 09/23/2022, 4:16 PM  Clinical Narrative:    Pt's auth for SNF rehab is still pending through [employer] portal. CM has updated the pt and his family.  TOC following.   Expected Discharge Plan: [place] Barriers to Discharge: Continued Medical Work up  Expected Discharge Plan and Services     Post Acute Care Choice: [place] Living arrangements for the past 2 months: Single Family Home Expected Discharge Date: 09/23/22                                     Social Determinants of Health (SDOH) Interventions SDOH Screenings   Food Insecurity: No Food Insecurity (09/18/2022)  Housing: Low Risk  (09/18/2022)  Transportation Needs: No Transportation Needs (09/18/2022)  Utilities: Not At Risk (09/18/2022)  Depression (PHQ2-9): Low Risk  (05/20/2019)  Tobacco Use: Low Risk  (09/17/2022)    Readmission Risk Interventions     No data to display

## 2022-09-23 NOTE — Plan of Care (Signed)
  Problem: Education: Goal: Knowledge of disease or condition will improve Outcome: Progressing   Problem: Health Behavior/Discharge Planning: Goal: Ability to manage health-related needs will improve Outcome: Progressing   Problem: Self-Care: Goal: Ability to participate in self-care as condition permits will improve Outcome: Progressing   Problem: Nutrition: Goal: Risk of aspiration will decrease Outcome: Progressing Goal: Dietary intake will improve Outcome: Progressing   Problem: Metabolic: Goal: Ability to maintain appropriate glucose levels will improve Outcome: Not Progressing

## 2022-09-23 NOTE — Progress Notes (Signed)
Mobility Specialist Progress Note:    09/23/22 1622  Mobility  Activity Ambulated with assistance in hallway  Level of Assistance Contact guard assist, steadying assist  Assistive Device Front wheel walker  Distance Ambulated (ft) 250 ft  Activity Response Tolerated well  Mobility Referral Yes  $Mobility charge 1 Mobility   Pt was agreeable to mobility session. Tolerated well and was asx throughout. Responded well to cues. Left pt in chair with all needs met.   Royetta Crochet Mobility Specialist Please contact via Solicitor or  Rehab office at [phone]

## 2022-09-23 NOTE — Care Management Important Message (Signed)
Important Message  Patient Details  Name: Fernando Hess MRN: [id number] Date of Birth: 28-Feb-1946   Medicare Important Message Given:  Yes     [name] 09/23/2022, 2:38 PM

## 2022-09-24 ENCOUNTER — Other Ambulatory Visit (HOSPITAL_COMMUNITY): Payer: Self-pay

## 2022-09-24 LAB — GLUCOSE, CAPILLARY
Glucose-Capillary: 160 mg/dL — ABNORMAL HIGH (ref 70–99)
Glucose-Capillary: 213 mg/dL — ABNORMAL HIGH (ref 70–99)

## 2022-09-24 NOTE — TOC Transition Note (Signed)
Transition of Care [facility]) - CM/SW Discharge Note   Patient Details  Name: Fernando Hess MRN: [id number] Date of Birth: 04/08/1946  Transition of Care [facility]) CM/SW Contact:  Pollie Friar, RN Phone Number: 09/24/2022, 10:20 AM   Clinical Narrative:    Patient is discharging to [facility] today for rehab. He will transport vis [facility]. Son Annie Main updated and he will inform the rest of the family. Bedside RN aware and d/c packet is at the desk.   Number for report: [phone]   Final next level of care: Skilled Nursing Facility Barriers to Discharge: No Barriers Identified   Patient Goals and CMS Choice CMS Medicare.gov Compare Post Acute Care list provided to:: Patient Represenative (must comment) Choice offered to / list presented to : Adult Children, Spouse  Discharge Placement PASRR number recieved: 09/23/22 PASRR number recieved: 09/23/22            Patient chooses bed at: [facility] Patient to be transferred to facility by: [place] Name of family member notified: Stephen--son Patient and family notified of of transfer: 09/24/22  Discharge Plan and Services Additional resources added to the After Visit Summary for       Post Acute Care Choice: [place]                               Social Determinants of Health (SDOH) Interventions SDOH Screenings   Food Insecurity: No Food Insecurity (09/18/2022)  Housing: Low Risk  (09/18/2022)  Transportation Needs: No Transportation Needs (09/18/2022)  Utilities: Not At Risk (09/18/2022)  Depression (PHQ2-9): Low Risk  (05/20/2019)  Tobacco Use: Low Risk  (09/17/2022)     Readmission Risk Interventions     No data to display

## 2022-09-24 NOTE — Progress Notes (Signed)
Occupational Therapy Treatment Patient Details Name: Fernando Hess MRN: [id number] DOB: 25-Nov-1945 Today's Date: 09/24/2022   History of present illness 77 y.o. male admitted with confusion and some difficulty with gait.  MRI with small focus of hyperintense signal on diffusion-weighted imaging in the right occipital lobe is new compared to prior MRI in November. Developed confusion and agitation requiring restraints. 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   OT comments  Patient continues to perform well with OT treatment with patient performed UB/LB bathing with supervision standing at sink using sink for support.  Patient able to perform transfers without RW but recommended use for safety. Patient's discharge recommendations for SNF continues to be appropriate for continued rehab to decrease risk of falls.    Recommendations for follow up therapy are one component of a multi-disciplinary discharge planning process, led by the attending physician.  Recommendations may be updated based on patient status, additional functional criteria and insurance authorization.    Follow Up Recommendations  No OT follow up     Assistance Recommended at Discharge PRN  Patient can return home with the following  Assist for transportation;Direct supervision/assist for medications management;Help with stairs or ramp for entrance   Equipment Recommendations  None recommended by OT    Recommendations for Other Services      Precautions / Restrictions Precautions Precautions: Fall Precaution Comments: two falls over past 3 weeks since d/c Restrictions Weight Bearing Restrictions: No       Mobility Bed Mobility Overal bed mobility: Needs Assistance Bed Mobility: Supine to Sit     Supine to sit: Supervision     General bed mobility comments: iincreased time  and use of bed rails    Transfers Overall transfer level: Needs assistance Equipment used: Rolling walker (2 wheels) Transfers: Sit to/from Stand Sit to Stand: Min guard     Step pivot transfers: Min guard     General transfer comment: min guard for safety wit h RW     Balance Overall balance assessment: Needs assistance Sitting-balance support: No upper extremity supported Sitting balance-Leahy Scale: Good     Standing balance support: No upper extremity supported, Bilateral upper extremity supported Standing balance-Leahy Scale: Fair Standing balance comment: able to stand at sink with no UE support                           ADL either performed or assessed with clinical judgement   ADL Overall ADL's : Needs assistance/impaired     Grooming: Wash/dry hands;Wash/dry face;Oral care;Supervision/safety;Standing Grooming Details (indicate cue type and reason): at sink Upper Body Bathing: Supervision/ safety;Standing Upper Body Bathing Details (indicate cue type and reason): at sink Lower Body Bathing: Sit to/from stand;Min guard Lower Body Bathing Details (indicate cue type and reason): standing at sink Upper Body Dressing : Minimal assistance;Standing Upper Body Dressing Details (indicate cue type and reason): to donn gown to cover back Lower Body Dressing: Supervision/safety;Sitting/lateral leans Lower Body Dressing Details (indicate cue type and reason): able to doff and donn socks in figure four position Toilet Transfer: Min guard;Rolling walker (2 wheels);Regular Toilet   Toileting- Water quality scientist and Hygiene: Min guard;Sit to/from stand              Extremity/Trunk Assessment              Vision       Perception  Praxis      Cognition Arousal/Alertness: Awake/alert Behavior During Therapy: WFL for tasks assessed/performed Overall Cognitive Status: Impaired/Different from baseline Area of Impairment: Attention,  Safety/judgement, Problem solving, Memory, Following commands, Awareness                   Current Attention Level: Sustained Memory: Decreased short-term memory Following Commands: Follows multi-step commands inconsistently, Follows one step commands with increased time Safety/Judgement: Decreased awareness of safety, Decreased awareness of deficits   Problem Solving: Slow processing General Comments: able to follow directions and cues for hand placement and safety with transfers        Exercises      Shoulder Instructions       General Comments      Pertinent Vitals/ Pain       Pain Assessment Pain Assessment: Faces Faces Pain Scale: No hurt Pain Intervention(s): Monitored during session  Home Living                                          Prior Functioning/Environment              Frequency  Min 2X/week        Progress Toward Goals  OT Goals(current goals can now be found in the care plan section)  Progress towards OT goals: Progressing toward goals  Acute Rehab OT Goals Patient Stated Goal: go to [facility] OT Goal Formulation: With patient Time For Goal Achievement: 10/02/22 Potential to Achieve Goals: Good ADL Goals Pt Will Perform Grooming: standing;with modified independence Pt Will Perform Lower Body Bathing: with supervision;sit to/from stand Pt Will Perform Lower Body Dressing: with supervision;sit to/from stand Pt Will Transfer to Toilet: with supervision;ambulating;grab bars;regular height toilet Pt Will Perform Toileting - Clothing Manipulation and hygiene: with supervision;sit to/from stand Pt Will Perform Tub/Shower Transfer: with supervision;Tub transfer;shower seat;ambulating;grab bars  Plan Discharge plan remains appropriate    Co-evaluation                 AM-PAC OT "6 Clicks" Daily Activity     Outcome Measure   Help from another person eating meals?: None Help from another person taking care  of personal grooming?: A Little Help from another person toileting, which includes using toliet, bedpan, or urinal?: A Little Help from another person bathing (including washing, rinsing, drying)?: A Little Help from another person to put on and taking off regular upper body clothing?: None Help from another person to put on and taking off regular lower body clothing?: A Little 6 Click Score: 20    End of Session Equipment Utilized During Treatment: Rolling walker (2 wheels)  OT Visit Diagnosis: Unsteadiness on feet (R26.81);Other symptoms and signs involving cognitive function;Repeated falls (R29.6)   Activity Tolerance Patient tolerated treatment well   Patient Left in chair;with call bell/phone within reach;with chair alarm set   Nurse Communication Mobility status        Time: 8250-5397 OT Time Calculation (min): 30 min  Charges: OT General Charges $OT Visit: 1 Visit OT Treatments $Self Care/Home Management : 23-37 mins  Lodema Hong, [place]  Office [place] 09/24/2022, 11:02 AM

## 2022-09-24 NOTE — Progress Notes (Signed)
Mobility Specialist Progress Note   09/24/22 1030  Mobility  Activity Ambulated with assistance in hallway  Level of Assistance Standby assist, set-up cues, supervision of patient - no hands on  Assistive Device Front wheel walker  Distance Ambulated (ft) 650 ft  Range of Motion/Exercises Active;All extremities  Activity Response Tolerated well   Patient received in recliner and eager to participate. Progressing well with mobility, was able to extend distance this session. Did required immediate seated rest upon standing secondary to dizziness that dissipated quickly. Ambulated supervision level with steady gait. Returned to room without complaint or incident. Was left in recliner with all needs met, call bell in reach.   Fernando Hess, BS EXP Mobility Specialist Please contact via SecureChat or Rehab office at [phone]

## 2022-09-24 NOTE — Discharge Summary (Signed)
[facility]  Physician Discharge Summary   Patient ID: Fernando Hess MRN: [id number] DOB/AGE: August 04, 1946 77 y.o.  Admit date: 09/17/2022 Discharge date:   09/24/2022   PCP: [name], MD  DISCHARGE DIAGNOSES:    Acute encephalopathy   Hyperlipidemia LDL goal <70   Essential hypertension   Coronary artery disease involving native coronary artery of native heart without angina pectoris   S/P CABG x 5   Peripheral artery disease   OSA on CPAP   Chronic heart failure with preserved ejection fraction (HFpEF) ([name])   Paroxysmal atrial fibrillation ([name])   Diabetes mellitus, type 2 ([name])   Anxiety   Gastro-esophageal reflux disease without esophagitis   Chronic kidney disease, stage 3b ([place])   Myasthenia gravis ([place])   RECOMMENDATIONS FOR OUTPATIENT FOLLOW UP: Patient needs to undergo brain MRI in 4 to 8 weeks as per neurology Patient needs to follow-up with [facility] in 6 to 8 weeks. Check CBC and basic metabolic panel in 3 to 4 days    Home Health: SNF Equipment/Devices: None  CODE STATUS: Full code  DISCHARGE CONDITION: fair  Diet recommendation: Heart healthy modified carbohydrate  INITIAL HISTORY: 77 y.o. 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 Wife noted that he had a slow and unsteady gait.  He could not figure out how to open his mailbox.  Could not figure out how to shift his car to drive up the driveway.  EMS was called.  Patient was brought into the hospital.    Consultants: Neurology   Procedures: LTM EEG   HOSPITAL COURSE:   Acute metabolic encephalopathy/concern for seizure activity/subacute stroke Patient seen by neurology.  MRI of the brain raised concern for subacute stroke. Patient was placed on continuous EEG.  No epileptiform  activity noted on EEG. LTM Discontinued.  No recommendation for antiepileptic drugs. Neurology recommends repeating MRI in about 4 weeks.  They also recommend outpatient sleep study.  They have given contact information for [facility] to his wife for follow-up. Patient on apixaban.   Echocardiogram not thought to be necessary by neurology. LDL is 66.  Continue statin. HbA1c was 14.6 in November. TSH normal.  X44 and folic acid levels were normal when checked recently. Patient with cognitive deficits.  Reevaluated by physical therapy.  Found to have impaired balance.  Recommendation changed to skilled nursing facility for short-term rehab.  Agree with this recommendation. Continue with Seroquel at current dose.   History of coronary artery disease He is status post CABG. Continue home medications. Cardiac status is stable.   Chronic kidney disease stage IIIb Baseline creatinine is between 1.8-2.2.  Patient is renal function stable for the most part.  Avoid nephrotoxic agents.  Check labs in 3 to 4 days.   Diabetes mellitus type 2, uncontrolled with hyperglycemia HbA1c 14.6 in November.  Continue Basaglar and lispro as before.  Monitor CBGs and adjust insulin dosage as appropriate.     Chronic diastolic CHF Last echocardiogram in 2022 showed a EF of 60 to 65% with grade 1 diastolic dysfunction. Volume status is stable.  Not on any diuretics currently.   Paroxysmal atrial fibrillation Continue Eliquis.  Not noted to be on any rate limiting drugs.   Essential hypertension Apparently has had issues with orthostatic hypotension recently hence the reason he is not on any antihypertensives currently. Blood pressures  have been stable.    GERD Continue PPI   History of anxiety and depression Continue duloxetine.   Peripheral artery disease Stable.   Obstructive sleep apnea Not on CPAP.  This needs to be addressed in the outpatient setting    Patient remains  stable.  Okay for discharge to SNF when bed is available.  PERTINENT LABS:  The results of significant diagnostics from this hospitalization (including imaging, microbiology, ancillary and laboratory) are listed below for reference.    Microbiology: Recent Results (from the past 240 hour(s))  Resp panel by RT-PCR (RSV, Flu A&B, Covid) Anterior Nasal Swab     Status: None   Collection Time: 09/17/22  3:00 PM   Specimen: Anterior Nasal Swab  Result Value Ref Range Status   SARS Coronavirus 2 by RT PCR NEGATIVE NEGATIVE Final    Comment: (NOTE) SARS-CoV-2 target nucleic acids are NOT DETECTED.  The SARS-CoV-2 RNA is generally detectable in upper respiratory specimens during the acute phase of infection. The lowest concentration of SARS-CoV-2 viral copies this assay can detect is 138 copies/mL. A negative result does not preclude SARS-Cov-2 infection and should not be used as the sole basis for treatment or other patient management decisions. A negative result may occur with  improper specimen collection/handling, submission of specimen other than nasopharyngeal swab, presence of viral mutation(s) within the areas targeted by this assay, and inadequate number of viral copies(<138 copies/mL). A negative result must be combined with clinical observations, patient history, and epidemiological information. The expected result is Negative.  Fact Sheet for Patients:  EntrepreneurPulse.com.au  Fact Sheet for Healthcare Providers:  IncredibleEmployment.be  This test is no t yet approved or cleared by the [place] FDA and  has been authorized for detection and/or diagnosis of SARS-CoV-2 by FDA under an Emergency Use Authorization (EUA). This EUA will remain  in effect (meaning this test can be used) for the duration of the COVID-19 declaration under Section 564(b)(1) of the Act, 21 U.S.C.section 360bbb-3(b)(1), unless the authorization is terminated   or revoked sooner.       Influenza A by PCR NEGATIVE NEGATIVE Final   Influenza B by PCR NEGATIVE NEGATIVE Final    Comment: (NOTE) The Xpert Xpress SARS-CoV-2/FLU/RSV plus assay is intended as an aid in the diagnosis of influenza from Nasopharyngeal swab specimens and should not be used as a sole basis for treatment. Nasal washings and aspirates are unacceptable for Xpert Xpress SARS-CoV-2/FLU/RSV testing.  Fact Sheet for Patients: EntrepreneurPulse.com.au  Fact Sheet for Healthcare Providers: IncredibleEmployment.be  This test is not yet approved or cleared by the [place] FDA and has been authorized for detection and/or diagnosis of SARS-CoV-2 by FDA under an Emergency Use Authorization (EUA). This EUA will remain in effect (meaning this test can be used) for the duration of the COVID-19 declaration under Section 564(b)(1) of the Act, 21 U.S.C. section 360bbb-3(b)(1), unless the authorization is terminated or revoked.     Resp Syncytial Virus by PCR NEGATIVE NEGATIVE Final    Comment: (NOTE) Fact Sheet for Patients: EntrepreneurPulse.com.au  Fact Sheet for Healthcare Providers: IncredibleEmployment.be  This test is not yet approved or cleared by the [place] FDA and has been authorized for detection and/or diagnosis of SARS-CoV-2 by FDA under an Emergency Use Authorization (EUA). This EUA will remain in effect (meaning this test can be used) for the duration of the COVID-19 declaration under Section 564(b)(1) of the Act, 21 U.S.C. section 360bbb-3(b)(1), unless the authorization is terminated or revoked.  Performed  at [facility], [place] [address] [place], [place] [address]      Labs:   Basic Metabolic Panel: Recent Labs  Lab 09/17/22 1201 09/18/22 0023 09/20/22 0932  NA 144  143 141 138  K 4.0  4.0 3.6 3.9  CL 106  107 111 107  CO2 21* 23 25  GLUCOSE 78  79  167* 241*  BUN '20  19 23 22  '$ CREATININE 1.99*  1.90* 2.01* 1.80*  CALCIUM 9.1 8.1* 8.3*    Liver Function Tests: Recent Labs  Lab 09/17/22 1201 09/18/22 0023  AST 35 27  ALT 29 26  ALKPHOS 131* 108  BILITOT 1.8* 1.0  PROT 5.8* 4.7*  ALBUMIN 3.0* 2.4*     Recent Labs  Lab 09/17/22 1541  AMMONIA 14    CBC: Recent Labs  Lab 09/17/22 1201 09/18/22 0023 09/20/22 0932  WBC 13.6* 10.5 9.3  NEUTROABS 9.0*  --   --   HGB 14.2  14.3 12.1* 12.3*  HCT 43.3  42.0 38.5* 37.5*  MCV 93.3 97.2 93.5  PLT 251 220 210       CBG: Recent Labs  Lab 09/23/22 0929 09/23/22 1217 09/23/22 1548 09/23/22 2127 09/24/22 0613  GLUCAP 216* 210* 196* 132* 160*      IMAGING STUDIES Overnight EEG with video  Result Date: 09/19/2022 Lora Havens, MD     09/20/2022  7:01 AM Patient Name: Fernando Hess MRN: [id number] Epilepsy Attending: Lora Havens Referring Physician/Provider: Lorenza Chick, MD Duration: 09/18/2022 1405 to 09/19/2022 1405 Patient history: 77 year old male with an episode of abnormal gait and confusion. EEG to evaluate for seizure. Level of alertness: Awake, asleep AEDs during EEG study: LEV Technical aspects: This EEG study was done with scalp electrodes positioned according to the 10-20 International system of electrode placement. Electrical activity was reviewed with band pass filter of 1-'70Hz'$ , sensitivity of 7 uV/mm, display speed of 60m/sec with a '60Hz'$  notched filter applied as appropriate. EEG data were recorded continuously and digitally stored.  Video monitoring was available and reviewed as appropriate. Description: The posterior dominant rhythm consists of 8 Hz activity of moderate voltage (25-35 uV) seen predominantly in posterior head regions, symmetric and reactive to eye opening and eye closing. Sleep was characterized by vertex waves, sleep spindles (12 to 14 Hz), maximal frontocentral region. Hyperventilation and photic stimulation were not performed.    IMPRESSION: This study is within normal limits. No seizures or epileptiform discharges were seen throughout the recording. A normal interictal EEG does not exclude the diagnosis of epilepsy. PLora Havens  EEG adult  Result Date: 09/18/2022 YLora Havens MD     09/18/2022  8:45 AM Patient Name: LKIEFER OPHEIMMRN: [id number] Attending: PLora HavensReferring Physician/Provider: MMarcelyn Bruins MD Date: 09/17/2022 Duration: 26.58 mins Patient history: 77year old male with an episode of abnormal gait and confusion.  EEG to evaluate for seizure. Level of alertness: Awake AEDs during EEG study: None Technical aspects: This EEG study was done with scalp electrodes positioned according to the 10-20 International system of electrode placement. Electrical activity was reviewed with band pass filter of 1-'70Hz'$ , sensitivity of 7 uV/mm, display speed of 347msec with a '60Hz'$  notched filter applied as appropriate. EEG data were recorded continuously and digitally stored.  Video monitoring was available and reviewed as appropriate. Description: The posterior dominant rhythm consists of 8 Hz activity of moderate voltage (25-35 uV) seen predominantly in posterior head regions, symmetric and  reactive to eye opening and eye closing. Drowsiness was characterized by attenuation of the posterior background rhythm. Sleep was characterized by vertex waves, sleep spindles (12 to 14 Hz), maximal frontocentral region. EEG showed intermittent right > left posterior quadrant 2 to 3 Hz delta slowing. Hyperventilation did not show any EEG change.  Physiologic photic driving was not seen during photic stimulation.  ABNORMALITY -Intermittent slow, right > left posterior quadrant IMPRESSION: This study is suggestive of cortical dysfunction arising from right more than left posterior quadrant, nonspecific etiology. No seizures or epileptiform discharges were seen throughout the recording. Please note that lack of  epileptiform activity does not exclude the diagnosis of epilepsy. Lora Havens   DG Chest Portable 1 View  Result Date: 09/17/2022 CLINICAL DATA:  Altered mental status. EXAM: PORTABLE CHEST 1 VIEW COMPARISON:  September 04, 2022. FINDINGS: Stable cardiomediastinal silhouette. Status post coronary bypass graft. Both lungs are clear. The visualized skeletal structures are unremarkable. IMPRESSION: No active disease. Electronically Signed   By: Marijo Conception M.D.   On: 09/17/2022 15:20   MR BRAIN W WO CONTRAST  Result Date: 09/17/2022 CLINICAL DATA:  Stroke suspected EXAM: MRI HEAD WITHOUT AND WITH CONTRAST TECHNIQUE: Multiplanar, multiecho pulse sequences of the brain and surrounding structures were obtained without and with intravenous contrast. CONTRAST:  14m GADAVIST GADOBUTROL 1 MMOL/ML IV SOLN COMPARISON:  Same day CT head/neck angiogram.  MRI Head 08/12/22 FINDINGS: Brain: Small focus of hyperintense signal in the right occipital lobe on diffusion-weighted imaging (series 3, image 21). This is new compared to prior MRI brain dated 08/12/2022, but without a definite correlate on the ADC map. There is also contrast enhancement in this region (series 14, image 21), which is suggestive of a subacute infarct. Sequela of moderate chronic microvascular ischemic change. No evidence of hemorrhage. No hydrocephalus. No extra-axial fluid collection. There is asymmetric contrast enhancement of the mastoid segment of the right facial nerve (series 14, image 10). There is an additional focus of contrast enhancement in the medial aspect of the right occipital lobe (series 14, image 20), which is also favored to represent an additional site a subacute infarct. Vascular: V4 segment of the left vertebral artery demonstrates decreased flow signal. Please see separately dictated same day CTA head and neck angiogram for additional vascular findings. Skull and upper cervical spine: Normal marrow signal. Sinuses/Orbits:  Bilateral lens replacement. Mucosal thickening bilateral maxillary, ethmoid, and frontal sinuses. Trace right mastoid effusion. Other: None IMPRESSION: 1. Small focus of hyperintense signal on diffusion-weighted imaging in the right occipital lobe is new compared to prior MRI brain dated 08/12/2022, but without a definite correlate on the ADC map. There is also contrast enhancement in this region, suggestive of a subacute infarct. Additional focus of contrast enhancement in the medial aspect of the right occipital lobe, which is also favored to represent an additional site of a subacute infarct. Recommend repeat contrast enhanced brain MRI in 4 weeks to assess for interval change. 2. Asymmetric contrast enhancement of the mastoid segment of the right facial nerve, which is nonspecific but can be seen in the setting of Bell's palsy. Electronically Signed   By: HMarin RobertsM.D.   On: 09/17/2022 15:03   CT ANGIO HEAD NECK W WO CM (CODE STROKE)  Result Date: 09/17/2022 CLINICAL DATA:  Neuro deficit, acute, stroke suspected. EXAM: CT ANGIOGRAPHY HEAD AND NECK TECHNIQUE: Multidetector CT imaging of the head and neck was performed using the standard protocol during bolus administration of intravenous contrast.  Multiplanar CT image reconstructions and MIPs were obtained to evaluate the vascular anatomy. Carotid stenosis measurements (when applicable) are obtained utilizing NASCET criteria, using the distal internal carotid diameter as the denominator. RADIATION DOSE REDUCTION: This exam was performed according to the departmental dose-optimization program which includes automated exposure control, adjustment of the mA and/or kV according to patient size and/or use of iterative reconstruction technique. CONTRAST:  23m OMNIPAQUE IOHEXOL 350 MG/ML SOLN COMPARISON:  None Available. FINDINGS: CTA NECK FINDINGS Aortic arch: Normal variant 4 vessel aortic arch with the left vertebral artery arising directly from the arch.  Minimal atherosclerosis without significant stenosis of the arch vessel origins. Right carotid system: Patent with mild calcified and soft plaque in the carotid bulb. No evidence of a significant stenosis or dissection. Left carotid system: Patent with a small to moderate amount of predominantly calcified plaque at the carotid bifurcation extending into the carotid bulb without significant associated stenosis. Additional soft plaque near the distal aspect of the carotid bulb results in 50% ICA stenosis. Vertebral arteries: The vertebral arteries are patent in the neck with the right being strongly dominant. Scattered atherosclerotic plaque throughout the right vertebral artery results in up to moderate V1 and mild V3 stenosis. Skeleton: Grade 1 anterolisthesis of C3 on C4 and grade 1 retrolisthesis of C4 on C5. Asymmetrically advanced left facet arthrosis at C2-3 and C3-4. Other neck: No evidence of cervical lymphadenopathy or mass. Upper chest: Clear lung apices. Review of the MIP images confirms the above findings CTA HEAD FINDINGS Anterior circulation: The internal carotid arteries are patent from skull base to carotid termini with mild cavernous and paraclinoid stenoses bilaterally as well as a mild-to-moderate distal right petrous stenosis. ACAs and MCAs are patent without evidence of a proximal branch occlusion or significant proximal stenosis. No aneurysm is identified. Posterior circulation: The intracranial right vertebral artery is patent with moderate multifocal stenoses due to prominent atherosclerotic plaque. The intracranial left vertebral artery is small and patent proximally but is occluded distal to the PICA origin. The basilar artery is patent and congenitally small with mild diffuse atherosclerotic irregularity. There are large posterior communicating arteries bilaterally with asymmetrically prominent hypoplasia of the left P1 segment. Both PCAs are patent with mild atherosclerotic irregularity of  the branch vessels but no evidence of a flow limiting proximal stenosis. No aneurysm is identified. Venous sinuses: As permitted by contrast timing, patent. Anatomic variants: Fetal left PCA. Review of the MIP images confirms the above findings These results were communicated to Dr. BCurly Shoresat 12:21 pm on 09/17/2022 by text page via the [facility] system. IMPRESSION: 1. 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 2. Patent and dominant right vertebral artery with moderate proximal and distal stenoses. 3. Mild-to-moderate intracranial ICA stenoses. 4. 50% proximal left ICA stenosis. 5.  Aortic Atherosclerosis (ICD10-I70.0). Electronically Signed   By: ALogan BoresM.D.   On: 09/17/2022 12:42   CT HEAD CODE STROKE WO CONTRAST  Result Date: 09/17/2022 CLINICAL DATA:  Code stroke. Neuro deficit, acute, stroke suspected. EXAM: CT HEAD WITHOUT CONTRAST TECHNIQUE: Contiguous axial images were obtained from the base of the skull through the vertex without intravenous contrast. RADIATION DOSE REDUCTION: This exam was performed according to the departmental dose-optimization program which includes automated exposure control, adjustment of the mA and/or kV according to patient size and/or use of iterative reconstruction technique. COMPARISON:  Head CT and MRI 08/12/2022 FINDINGS: Brain: There is no evidence of an acute supratentorial infarct, intracranial hemorrhage, mass, midline shift, or  extra-axial fluid collection. Motion and streak artifact limited assessment of the posterior fossa. There is mild cerebral atrophy. Hypodensities in the cerebral white matter bilaterally are unchanged and nonspecific but compatible with mild chronic small vessel ischemic disease. Vascular: Calcified atherosclerosis at the skull base. No hyperdense vessel. Skull: No fracture or suspicious osseous lesion is identified within limitations of motion artifact. Sinuses/Orbits: Mucous retention cysts in the maxillary sinuses.  Clear mastoid air cells. Bilateral cataract extraction. Other: None. ASPECTS [facility] Stroke Program Early CT Score) - Ganglionic level infarction (caudate, lentiform nuclei, internal capsule, insula, M1-M3 cortex): 7 - Supraganglionic infarction (M4-M6 cortex): 3 Total score (0-10 with 10 being normal): 10 IMPRESSION: 1. No evidence of acute intracranial abnormality. Limited assessment of the posterior fossa. 2. ASPECTS of 10. 3. Mild chronic small vessel ischemic disease. These results were communicated to Dr. Curly Shores at 12:16 pm on 09/17/2022 by text page via the [facility] messaging system. Electronically Signed   By: Logan Bores M.D.   On: 09/17/2022 12:22   DG Chest Portable 1 View  Result Date: 09/04/2022 CLINICAL DATA:  Weakness, shortness of breath EXAM: PORTABLE CHEST 1 VIEW COMPARISON:  06/17/2022 FINDINGS: Cardiomegaly status post median sternotomy and CABG. Both lungs are clear. The visualized skeletal structures are unremarkable. IMPRESSION: Cardiomegaly without acute abnormality of the lungs in AP portable projection. Electronically Signed   By: Delanna Ahmadi M.D.   On: 09/04/2022 16:11    DISCHARGE EXAMINATION: Vitals:   09/23/22 2002 09/23/22 2338 09/24/22 0401 09/24/22 0816  BP: 91/68 118/64 (!) 112/92 108/87  Pulse: 67 69 90 72  Resp: '18 16 16 18  '$ Temp: 98.6 F (37 C) 98.2 F (36.8 C) 98.5 F (36.9 C) 98.3 F (36.8 C)  TempSrc: Oral Oral Oral   SpO2: 94% 95% 96% 99%  Weight:      Height:       General appearance: Awake alert.  In no distress Resp: Clear to auscultation bilaterally.  Normal effort Cardio: S1-S2 is normal regular.  No S3-S4.  No rubs murmurs or bruit GI: Abdomen is soft.  Nontender nondistended.  Bowel sounds are present normal.  No masses organomegaly    DISPOSITION: SNF  Discharge Instructions     Call MD for:  difficulty breathing, headache or visual disturbances   Complete by: As directed    Call MD for:  extreme fatigue   Complete by: As directed     Call MD for:  persistant dizziness or light-headedness   Complete by: As directed    Call MD for:  persistant nausea and vomiting   Complete by: As directed    Call MD for:  severe uncontrolled pain   Complete by: As directed    Call MD for:  temperature >100.4   Complete by: As directed    Diet - low sodium heart healthy   Complete by: As directed    Discharge instructions   Complete by: As directed    Please review instructions on the discharge summary.  You were cared for by a hospitalist during your hospital stay. If you have any questions about your discharge medications or the care you received while you were in the hospital after you are discharged, you can call the unit and asked to speak with the hospitalist on call if the hospitalist that took care of you is not available. Once you are discharged, your primary care physician will handle any further medical issues. Please note that NO REFILLS for any discharge medications will be  authorized once you are discharged, as it is imperative that you return to your primary care physician (or establish a relationship with a primary care physician if you do not have one) for your aftercare needs so that they can reassess your need for medications and monitor your lab values. If you do not have a primary care physician, you can call [phone] for a physician referral.   Increase activity slowly   Complete by: As directed           Allergies as of 09/24/2022       Reactions   Cilostazol Swelling, Other (See Comments)   Edema   Dulaglutide Nausea And Vomiting, Other (See Comments)   TRULICITY   Levofloxacin Hives, Itching, Rash   Liraglutide Other (See Comments)   Severe fatigue & insomnia   Lisinopril Itching, Rash, Cough        Medication List     STOP taking these medications    Benadryl Allergy 25 mg capsule Generic drug: diphenhydrAMINE   zaleplon 5 MG capsule Commonly known as: [name]       TAKE these  medications    acetaminophen 325 MG tablet Commonly known as: TYLENOL Take 325-650 mg by mouth every 6 (six) hours as needed for headache or mild pain.   atorvastatin 80 MG tablet Commonly known as: LIPITOR Take 1 tablet (80 mg total) by mouth daily. What changed: when to take this   B-D UF III MINI PEN NEEDLES 31G X 5 MM Misc Generic drug: Insulin Pen Needle USE AS DIRECTED WITH LANTUS SOLOSTAR   Basaglar KwikPen 100 UNIT/ML Inject 40 Units into the skin in the morning.   DULoxetine 30 MG capsule Commonly known as: CYMBALTA Take 30 mg by mouth every morning. Take along with 60 mg capsule=90 mg   DULoxetine 60 MG capsule Commonly known as: CYMBALTA Take 60 mg by mouth every morning. Take along with 30 mg capsule=90 mg   Eliquis 5 MG Tabs tablet Generic drug: apixaban Take 1 tablet (5 mg total) by mouth in the morning and at bedtime.   FreeStyle Libre 2 Sensor Misc Inject 1 Device into the skin every 14 (fourteen) days.   guaiFENesin-codeine 100-10 MG/5ML syrup Commonly known as: ROBITUSSIN AC take 29m BY MOUTH EVERY 4 HOURS AS NEEDED What changed:  how much to take how to take this when to take this reasons to take this additional instructions   insulin lispro 100 UNIT/ML KwikPen Commonly known as: HUMALOG Inject 8 Units into the skin 3 (three) times daily with meals.   MAGNESIUM PO Take 350 mg by mouth every evening.   nitroGLYCERIN 0.4 MG SL tablet Commonly known as: NITROSTAT Place 0.4 mg under the tongue every 5 (five) minutes as needed for chest pain.   nystatin cream Commonly known as: MYCOSTATIN Apply 1 Application topically 2 (two) times daily as needed for dry skin. Apply to the groin in the morning and evening when not using the nystatin powder   nystatin powder Commonly known as: MYCOSTATIN/NYSTOP Apply 1 Application topically 2 (two) times daily as needed (skin irritation). Apply to the groin in the morning and evening   ondansetron 4 MG  disintegrating tablet Commonly known as: ZOFRAN-ODT Take 4 mg by mouth every 8 (eight) hours as needed for nausea or vomiting (dissolve orally).   pantoprazole 40 MG tablet Commonly known as: PROTONIX TAKE 1 TABLET BY MOUTH EVERY DAY What changed: when to take this   polyethylene glycol 17 g packet Commonly known  as: MIRALAX / GLYCOLAX Take 17 g by mouth daily as needed for mild constipation.   QUEtiapine 50 MG tablet Commonly known as: SEROQUEL Take 1 tablet (50 mg total) by mouth 2 (two) times daily.   SENIOR MULTIVITAMIN PLUS PO Take 1 tablet by mouth daily with breakfast.          Contact information for follow-up providers     [name], MD. Schedule an appointment as soon as possible for a visit in 1 week(s).   Specialty: Family Medicine Why: post hospitalization follow up Contact information: [place] [address] [phone]         Garvin Fila, MD Follow up.   Specialties: Neurology, Radiology Why: post hospitalization follow up Contact information: [place] [address] [phone]              Contact information for after-discharge care     Destination     [facility] .   Service: Skilled Nursing Contact information: [address] Address [place] [phone]                     TOTAL DISCHARGE TIME: 66 mins  [facility] Pager on [email or web address]  09/24/2022, 9:27 AM

## 2022-09-25 DIAGNOSIS — N1832 Chronic kidney disease, stage 3b: Secondary | ICD-10-CM | POA: Diagnosis not present

## 2022-09-25 DIAGNOSIS — R531 Weakness: Secondary | ICD-10-CM | POA: Diagnosis not present

## 2022-09-25 DIAGNOSIS — G7000 Myasthenia gravis without (acute) exacerbation: Secondary | ICD-10-CM

## 2022-09-25 DIAGNOSIS — I10 Essential (primary) hypertension: Secondary | ICD-10-CM | POA: Diagnosis not present

## 2022-09-25 DIAGNOSIS — E113299 Type 2 diabetes mellitus with mild nonproliferative diabetic retinopathy without macular edema, unspecified eye: Secondary | ICD-10-CM | POA: Diagnosis not present

## 2022-09-29 ENCOUNTER — Encounter (INDEPENDENT_AMBULATORY_CARE_PROVIDER_SITE_OTHER): Payer: Medicare Other | Admitting: Ophthalmology

## 2022-10-03 DIAGNOSIS — M6281 Muscle weakness (generalized): Secondary | ICD-10-CM | POA: Diagnosis not present

## 2022-10-03 DIAGNOSIS — G7000 Myasthenia gravis without (acute) exacerbation: Secondary | ICD-10-CM | POA: Diagnosis not present

## 2022-10-03 DIAGNOSIS — R2689 Other abnormalities of gait and mobility: Secondary | ICD-10-CM | POA: Diagnosis not present

## 2022-10-12 ENCOUNTER — Emergency Department (HOSPITAL_COMMUNITY): Payer: Medicare Other

## 2022-10-12 ENCOUNTER — Emergency Department (HOSPITAL_COMMUNITY)
Admission: EM | Admit: 2022-10-12 | Discharge: 2022-10-12 | Disposition: A | Discharge status: Home / Self Care | Payer: Medicare Other | Attending: Emergency Medicine | Admitting: Emergency Medicine

## 2022-10-12 DIAGNOSIS — R519 Headache, unspecified: Secondary | ICD-10-CM | POA: Diagnosis present

## 2022-10-12 DIAGNOSIS — F0390 Unspecified dementia without behavioral disturbance: Secondary | ICD-10-CM | POA: Insufficient documentation

## 2022-10-12 DIAGNOSIS — Z794 Long term (current) use of insulin: Secondary | ICD-10-CM | POA: Insufficient documentation

## 2022-10-12 DIAGNOSIS — I77810 Thoracic aortic ectasia: Secondary | ICD-10-CM

## 2022-10-12 DIAGNOSIS — I77819 Aortic ectasia, unspecified site: Secondary | ICD-10-CM | POA: Insufficient documentation

## 2022-10-12 DIAGNOSIS — Z7901 Long term (current) use of anticoagulants: Secondary | ICD-10-CM | POA: Diagnosis not present

## 2022-10-12 DIAGNOSIS — N289 Disorder of kidney and ureter, unspecified: Secondary | ICD-10-CM | POA: Insufficient documentation

## 2022-10-12 DIAGNOSIS — R079 Chest pain, unspecified: Secondary | ICD-10-CM | POA: Diagnosis not present

## 2022-10-12 DIAGNOSIS — M542 Cervicalgia: Secondary | ICD-10-CM | POA: Insufficient documentation

## 2022-10-12 DIAGNOSIS — W19XXXA Unspecified fall, initial encounter: Secondary | ICD-10-CM

## 2022-10-12 DIAGNOSIS — W0110XA Fall on same level from slipping, tripping and stumbling with subsequent striking against unspecified object, initial encounter: Secondary | ICD-10-CM | POA: Diagnosis not present

## 2022-10-12 DIAGNOSIS — N400 Enlarged prostate without lower urinary tract symptoms: Secondary | ICD-10-CM | POA: Insufficient documentation

## 2022-10-12 LAB — I-STAT CHEM 8, ED
BUN: 35 mg/dL — ABNORMAL HIGH (ref 8–23)
Calcium, Ion: 1.22 mmol/L (ref 1.15–1.40)
Chloride: 106 mmol/L (ref 98–111)
Creatinine, Ser: 1.9 mg/dL — ABNORMAL HIGH (ref 0.61–1.24)
Glucose, Bld: 229 mg/dL — ABNORMAL HIGH (ref 70–99)
HCT: 44 % (ref 39.0–52.0)
Hemoglobin: 15 g/dL (ref 13.0–17.0)
Potassium: 4.4 mmol/L (ref 3.5–5.1)
Sodium: 141 mmol/L (ref 135–145)
TCO2: 28 mmol/L (ref 22–32)

## 2022-10-12 LAB — PROTIME-INR
INR: 1.5 — ABNORMAL HIGH (ref 0.8–1.2)
Prothrombin Time: 17.6 seconds — ABNORMAL HIGH (ref 11.4–15.2)

## 2022-10-12 LAB — LACTIC ACID, PLASMA: Lactic Acid, Venous: 1.5 mmol/L (ref 0.5–1.9)

## 2022-10-12 LAB — CBC
HCT: 45.1 % (ref 39.0–52.0)
Hemoglobin: 14.4 g/dL (ref 13.0–17.0)
MCH: 30.3 pg (ref 26.0–34.0)
MCHC: 31.9 g/dL (ref 30.0–36.0)
MCV: 94.7 fL (ref 80.0–100.0)
Platelets: 247 10*3/uL (ref 150–400)
RBC: 4.76 MIL/uL (ref 4.22–5.81)
RDW: 14.7 % (ref 11.5–15.5)
WBC: 15.9 10*3/uL — ABNORMAL HIGH (ref 4.0–10.5)
nRBC: 0 % (ref 0.0–0.2)

## 2022-10-12 LAB — COMPREHENSIVE METABOLIC PANEL
ALT: 30 U/L (ref 0–44)
AST: 26 U/L (ref 15–41)
Albumin: 3 g/dL — ABNORMAL LOW (ref 3.5–5.0)
Alkaline Phosphatase: 105 U/L (ref 38–126)
Anion gap: 6 (ref 5–15)
BUN: 30 mg/dL — ABNORMAL HIGH (ref 8–23)
CO2: 29 mmol/L (ref 22–32)
Calcium: 9 mg/dL (ref 8.9–10.3)
Chloride: 104 mmol/L (ref 98–111)
Creatinine, Ser: 1.94 mg/dL — ABNORMAL HIGH (ref 0.61–1.24)
GFR, Estimated: 35 mL/min — ABNORMAL LOW (ref >60)
Glucose, Bld: 232 mg/dL — ABNORMAL HIGH (ref 70–99)
Potassium: 4.3 mmol/L (ref 3.5–5.1)
Sodium: 139 mmol/L (ref 135–145)
Total Bilirubin: 1.2 mg/dL (ref 0.3–1.2)
Total Protein: 5.7 g/dL — ABNORMAL LOW (ref 6.5–8.1)

## 2022-10-12 LAB — SAMPLE TO BLOOD BANK

## 2022-10-12 MED ORDER — FENTANYL CITRATE PF 50 MCG/ML IJ SOSY
50.0000 ug | PREFILLED_SYRINGE | Freq: Once | INTRAMUSCULAR | Status: AC
  Administered 2022-10-12: 50 ug via INTRAVENOUS
  Filled 2022-10-12: qty 1

## 2022-10-12 MED ORDER — IOHEXOL 350 MG/ML SOLN
75.0000 mL | Freq: Once | INTRAVENOUS | Status: AC | PRN
  Administered 2022-10-12: 75 mL via INTRAVENOUS

## 2022-10-12 NOTE — ED Provider Notes (Signed)
[facility] Provider Note   CSN: [id number] Arrival date & time: 10/12/22  0115     History {Add pertinent medical, surgical, social history, OB history to HPI:1} Chief Complaint  Patient presents with  . Fernando Hess is a 77 y.o. male.  77 year old presents ER today after a fall.  Patient is on Eliquis for atrial fibrillation.  Hit his head.  But is not sure if he passed out.  Having pain in his head and neck and chest.  Uses a rolling walker at home.   Fall      Home Medications Prior to Admission medications   Medication Sig Start Date End Date Taking? Authorizing Provider  acetaminophen (TYLENOL) 325 MG tablet Take 325-650 mg by mouth every 6 (six) hours as needed for headache or mild pain.    [provider]  apixaban (ELIQUIS) 5 MG TABS tablet Take 1 tablet (5 mg total) by mouth in the morning and at bedtime. 09/23/22   [name], MD  atorvastatin (LIPITOR) 80 MG tablet Take 1 tablet (80 mg total) by mouth daily. Patient taking differently: Take 80 mg by mouth every evening. 11/07/21 09/17/22  [name], [name]  B-D UF III MINI PEN NEEDLES 31G X 5 MM MISC USE AS DIRECTED WITH LANTUS SOLOSTAR 04/02/19   [provider]  Continuous Blood Gluc Sensor (FREESTYLE LIBRE 2 SENSOR) MISC Inject 1 Device into the skin every 14 (fourteen) days.    [provider]  DULoxetine (CYMBALTA) 30 MG capsule Take 30 mg by mouth every morning. Take along with 60 mg capsule=90 mg    [provider]  DULoxetine (CYMBALTA) 60 MG capsule Take 60 mg by mouth every morning. Take along with 30 mg capsule=90 mg 04/12/18   [provider]  guaiFENesin-codeine (ROBITUSSIN AC) 100-10 MG/5ML syrup take 39m BY MOUTH EVERY 4 HOURS AS NEEDED Patient taking differently: Take 10 mLs by mouth every 4 (four) hours as needed for cough or congestion. 06/10/22   [name], MD  Insulin Glargine (BASAGLAR  KWIKPEN) 100 UNIT/ML SOPN Inject 40 Units into the skin in the morning.    [provider]  insulin lispro (HUMALOG) 100 UNIT/ML KwikPen Inject 8 Units into the skin 3 (three) times daily with meals.    [provider]  MAGNESIUM PO Take 350 mg by mouth every evening.    [provider]  Multiple Vitamins-Minerals (SENIOR MULTIVITAMIN PLUS PO) Take 1 tablet by mouth daily with breakfast.    [provider]  nitroGLYCERIN (NITROSTAT) 0.4 MG SL tablet Place 0.4 mg under the tongue every 5 (five) minutes as needed for chest pain.    [provider]  nystatin (MYCOSTATIN/NYSTOP) powder Apply 1 Application topically 2 (two) times daily as needed (skin irritation). Apply to the groin in the morning and evening    [provider]  nystatin cream (MYCOSTATIN) Apply 1 Application topically 2 (two) times daily as needed for dry skin. Apply to the groin in the morning and evening when not using the nystatin powder    [provider]  ondansetron (ZOFRAN-ODT) 4 MG disintegrating tablet Take 4 mg by mouth every 8 (eight) hours as needed for nausea or vomiting (dissolve orally).    [provider]  pantoprazole (PROTONIX) 40 MG tablet TAKE 1 TABLET BY MOUTH EVERY DAY Patient taking differently: Take 40 mg by mouth in the morning. 11/25/19   [name]  MD  polyethylene glycol (MIRALAX / GLYCOLAX) 17 g packet Take 17 g by mouth daily as needed for mild constipation. 09/22/22   [name], MD  QUEtiapine (SEROQUEL) 50 MG tablet Take 1 tablet (50 mg total) by mouth 2 (two) times daily. 09/23/22   [name], MD      Allergies    Cilostazol, Dulaglutide, Levofloxacin, Liraglutide, and Lisinopril    Review of Systems   Review of Systems  Physical Exam Updated Vital Signs BP (!) 147/93   Pulse 88   Temp 97.9 F (36.6 C) (Oral)   Resp 16   SpO2 95%  Physical Exam  ED Results / Procedures / Treatments   Labs (all labs ordered  are listed, but only abnormal results are displayed) Labs Reviewed  COMPREHENSIVE METABOLIC PANEL - Abnormal; Notable for the following components:      Result Value   Glucose, Bld 232 (*)    BUN 30 (*)    Creatinine, Ser 1.94 (*)    Total Protein 5.7 (*)    Albumin 3.0 (*)    GFR, Estimated 35 (*)    All other components within normal limits  CBC - Abnormal; Notable for the following components:   WBC 15.9 (*)    All other components within normal limits  PROTIME-INR - Abnormal; Notable for the following components:   Prothrombin Time 17.6 (*)    INR 1.5 (*)    All other components within normal limits  I-STAT CHEM 8, ED - Abnormal; Notable for the following components:   BUN 35 (*)    Creatinine, Ser 1.90 (*)    Glucose, Bld 229 (*)    All other components within normal limits  LACTIC ACID, PLASMA  ETHANOL  URINALYSIS, W/ REFLEX TO CULTURE (INFECTION SUSPECTED)  SAMPLE TO BLOOD BANK    EKG None  Radiology CT HEAD WO CONTRAST  Result Date: 10/12/2022 CLINICAL DATA:  [place] in the bathroom at home, hit the back of his head, with head and neck pain and blunt polytrauma. EXAM: CT HEAD WITHOUT CONTRAST CT CERVICAL SPINE WITHOUT CONTRAST CT CHEST, ABDOMEN AND PELVIS WITH CONTRAST TECHNIQUE: Contiguous axial images were obtained from the base of the skull through the vertex without intravenous contrast. Multidetector CT imaging of the cervical spine was performed without intravenous contrast. Multiplanar CT image reconstructions were also generated. Multidetector CT imaging of the chest, abdomen and pelvis was performed following the standard protocol during bolus administration of intravenous contrast. RADIATION DOSE REDUCTION: This exam was performed according to the departmental dose-optimization program which includes automated exposure control, adjustment of the mA and/or kV according to patient size and/or use of iterative reconstruction technique. CONTRAST:  63m OMNIPAQUE IOHEXOL  350 MG/ML SOLN COMPARISON:  Head CT 09/17/2022, cervical spine CT 06/17/2022, chest CT without contrast 04/29/2022, and abdomen and pelvis CT no contrast 08/07/2018. Also MRI lumbar spine 08/12/2022. FINDINGS: CT HEAD FINDINGS Brain: Again noted are mild-to-moderate cerebral atrophy and atrophic ventriculomegaly, moderately advanced small-vessel disease of the cerebral white matter, relatively mild cerebellar atrophy. There is no midline shift. There is no new asymmetry concerning for an acute infarct, hemorrhage or mass. 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 Vascular: There are calcifications in the carotid siphons and distal vertebral arteries. No hyperdense central vessels. Skull: Negative for fracture or focal lesions. No visible scalp hematoma. Sinuses/Orbits: Old lens replacements. Otherwise negative orbits. Mucous retention cysts are again noted in the maxillary sinuses and mild membrane disease. Clear mastoids.  Other: None. CT CERVICAL FINDINGS Alignment: 3 mm anterolisthesis, likely degenerative etiology again noted C3-4 and 2 mm retrolisthesis of C4 on 5. There is no new or further alignment abnormality. There is again noted bone-on-bone anterior atlantodental joint space loss with osteophytosis. Skull base and vertebrae: No fracture is evident. No focal pathologic lesion. Soft tissues and spinal canal: No prevertebral fluid or swelling. No visible canal hematoma. There are moderate calcifications in both proximal cervical ICAs. Disc levels: There is partial disc space loss again at C4-5 and C5-6. Other discs are maintained in height. There is mild spondylosis. No herniated discs or cord compromise. Mild facet hypertrophy is seen with mild foraminal narrowing on the left at C2-3 at C3-4 and bilaterally at C4-5. No critical foraminal narrowing. Other:  None. 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 Left ventricular free wall and septal hypertrophy  are present, calcifications in the aortic valve leaflets and minimally in the mitral ring, with three-vessel native calcific CAD. There is mild aortic atherosclerosis and tortuosity. Slight dilatation of the aortic root at the sinuses of Valsalva measures 4.1 cm. The remainder is within normal caliber limits. The great vessels are clear apart from minimal calcification in the subclavian arteries. The pulmonary arteries and veins are normal in caliber. The pulmonary arteries are centrally clear. There is no pericardial effusion. Mediastinum/Nodes: No enlarged mediastinal, hilar, or axillary lymph nodes. Thyroid gland, trachea, and esophagus demonstrate no significant findings. Lungs/Pleura: There is mild posterior atelectasis. The lungs are clear of infiltrates and contusions. No nodules are seen. There is mild chronic subpleural reticulation in the right lower lobe base. No pleural effusion, thickening or pneumothorax. Chronic asymmetric elevation right hemidiaphragm. Musculoskeletal: There is mild osteopenia with degenerative changes of the spine. No spinal compression fracture or other acute regional skeletal abnormality is seen. CT ABDOMEN PELVIS FINDINGS Hepatobiliary: No hepatic injury or perihepatic hematoma. Gallbladder is unremarkable. No biliary dilatation. The liver is mildly steatotic without mass. Pancreas: Generalized glandular fatty atrophy. No mass or acute abnormality. Spleen: No splenic injury or perisplenic hematoma. Adrenals/Urinary Tract: No adrenal hemorrhage or renal injury identified. Bladder is unremarkable. There is no adrenal mass. Both kidneys demonstrate scattered scar-like cortical defects. Left kidney is pelvic. There are few small nonobstructing caliceal stones in both kidneys, larger 7 mm caliceal stone in the left kidney. There are no obstructing stones either side. No hydronephrosis. There is a homogeneous low-density lesion measuring 3 cm anteriorly in the left kidney, but  measuring above the typical density of fluid. Hounsfield density is 37 Hounsfield units. This probably was present on the prior noncontrast study but may have slightly increased in size. This is probably a proteinaceous or hemorrhagic cyst but warrants follow-up with MRI to exclude a solid component. Right kidney with no focal abnormality. Stomach/Bowel: No dilatation or wall thickening. An appendix is not seen in this patient. There is mild fecal stasis. Uncomplicated colonic diverticula. Dense stool in the rectosigmoid segment. Vascular/Lymphatic: Aortic atherosclerosis. No enlarged abdominal or pelvic lymph nodes. Reproductive: There is mild prostatomegaly. Other: Small umbilical fat hernia. There is no free air, free hemorrhage, free fluid or incarcerated hernia. Musculoskeletal: Osteopenia and degenerative change lumbar spine. There is a mild chronic upper plate anterior wedge compression fracture of the L5 vertebral body. No new compression fracture seen. No acute regional skeletal findings. IMPRESSION: 1. No acute intracranial CT findings or depressed skull fractures. Chronic changes. 2. Osteopenia and degenerative change without evidence of cervical fractures or traumatic listhesis. Chronic grade  1 listhesis at 2 levels. 3. No acute trauma related findings in the chest, abdomen or pelvis. 4. 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 5. 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 Recommend annual imaging followup by CTA or MRA. This recommendation follows 2010 ACCF/AHA/AATS/ACR/ASA/SCA/SCAI/SIR/STS/SVM Guidelines for the Diagnosis and Management of Patients with Thoracic Aortic Disease. Circulation. 2010; 121: C376-E831. Aortic aneurysm NOS (ICD10-I71.9) 6. 3 cm homogeneous low-density lesion anteriorly in the left kidney, probably a proteinaceous or hemorrhagic cyst but measuring above the typical density of fluid. Follow-up with MRI  recommended to exclude a solid component. 7. 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 8. Constipation and diverticulosis. 9. Prostatomegaly. 10. Mild chronic compression fracture of L1. Aortic Atherosclerosis (ICD10-I70.0). Electronically Signed   By: Telford Nab M.D.   On: 10/12/2022 03:48   CT CERVICAL SPINE WO CONTRAST  Result Date: 10/12/2022 CLINICAL DATA:  [place] in the bathroom at home, hit the back of his head, with head and neck pain and blunt polytrauma. EXAM: CT HEAD WITHOUT CONTRAST CT CERVICAL SPINE WITHOUT CONTRAST CT CHEST, ABDOMEN AND PELVIS WITH CONTRAST TECHNIQUE: Contiguous axial images were obtained from the base of the skull through the vertex without intravenous contrast. Multidetector CT imaging of the cervical spine was performed without intravenous contrast. Multiplanar CT image reconstructions were also generated. Multidetector CT imaging of the chest, abdomen and pelvis was performed following the standard protocol during bolus administration of intravenous contrast. RADIATION DOSE REDUCTION: This exam was performed according to the departmental dose-optimization program which includes automated exposure control, adjustment of the mA and/or kV according to patient size and/or use of iterative reconstruction technique. CONTRAST:  65m OMNIPAQUE IOHEXOL 350 MG/ML SOLN COMPARISON:  Head CT 09/17/2022, cervical spine CT 06/17/2022, chest CT without contrast 04/29/2022, and abdomen and pelvis CT no contrast 08/07/2018. Also MRI lumbar spine 08/12/2022. FINDINGS: CT HEAD FINDINGS Brain: Again noted are mild-to-moderate cerebral atrophy and atrophic ventriculomegaly, moderately advanced small-vessel disease of the cerebral white matter, relatively mild cerebellar atrophy. There is no midline shift. There is no new asymmetry concerning for an acute infarct, hemorrhage or mass. Small chronic lacunar infarcts are again noted in the external  capsules. Vascular: There are calcifications in the carotid siphons and distal vertebral arteries. No hyperdense central vessels. Skull: Negative for fracture or focal lesions. No visible scalp hematoma. Sinuses/Orbits: Old lens replacements. Otherwise negative orbits. Mucous retention cysts are again noted in the maxillary sinuses and mild membrane disease. Clear mastoids. Other: None. CT CERVICAL FINDINGS Alignment: 3 mm anterolisthesis, likely degenerative etiology again noted C3-4 and 2 mm retrolisthesis of C4 on 5. There is no new or further alignment abnormality. There is again noted bone-on-bone anterior atlantodental joint space loss with osteophytosis. Skull base and vertebrae: No fracture is evident. No focal pathologic lesion. Soft tissues and spinal canal: No prevertebral fluid or swelling. No visible canal hematoma. There are moderate calcifications in both proximal cervical ICAs. Disc levels: There is partial disc space loss again at C4-5 and C5-6. Other discs are maintained in height. There is mild spondylosis. No herniated discs or cord compromise. Mild facet hypertrophy is seen with mild foraminal narrowing on the left at C2-3 at C3-4 and bilaterally at C4-5. No critical foraminal narrowing. Other:  None. 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 Left ventricular free wall and septal hypertrophy are present, calcifications in the aortic valve leaflets and minimally in the mitral ring, with  three-vessel native calcific CAD. There is mild aortic atherosclerosis and tortuosity. Slight dilatation of the aortic root at the sinuses of Valsalva measures 4.1 cm. The remainder is within normal caliber limits. The great vessels are clear apart from minimal calcification in the subclavian arteries. The pulmonary arteries and veins are normal in caliber. The pulmonary arteries are centrally clear. There is no pericardial effusion. Mediastinum/Nodes: No enlarged mediastinal, hilar, or  axillary lymph nodes. Thyroid gland, trachea, and esophagus demonstrate no significant findings. Lungs/Pleura: There is mild posterior atelectasis. The lungs are clear of infiltrates and contusions. No nodules are seen. There is mild chronic subpleural reticulation in the right lower lobe base. No pleural effusion, thickening or pneumothorax. Chronic asymmetric elevation right hemidiaphragm. Musculoskeletal: There is mild osteopenia with degenerative changes of the spine. No spinal compression fracture or other acute regional skeletal abnormality is seen. CT ABDOMEN PELVIS FINDINGS Hepatobiliary: No hepatic injury or perihepatic hematoma. Gallbladder is unremarkable. No biliary dilatation. The liver is mildly steatotic without mass. Pancreas: Generalized glandular fatty atrophy. No mass or acute abnormality. Spleen: No splenic injury or perisplenic hematoma. Adrenals/Urinary Tract: No adrenal hemorrhage or renal injury identified. Bladder is unremarkable. There is no adrenal mass. Both kidneys demonstrate scattered scar-like cortical defects. Left kidney is pelvic. There are few small nonobstructing caliceal stones in both kidneys, larger 7 mm caliceal stone in the left kidney. There are no obstructing stones either side. No hydronephrosis. There is a homogeneous low-density lesion measuring 3 cm anteriorly in the left kidney, but measuring above the typical density of fluid. Hounsfield density is 37 Hounsfield units. This probably was present on the prior noncontrast study but may have slightly increased in size. This is probably a proteinaceous or hemorrhagic cyst but warrants follow-up with MRI to exclude a solid component. Right kidney with no focal abnormality. Stomach/Bowel: No dilatation or wall thickening. An appendix is not seen in this patient. There is mild fecal stasis. Uncomplicated colonic diverticula. Dense stool in the rectosigmoid segment. Vascular/Lymphatic: Aortic atherosclerosis. No enlarged  abdominal or pelvic lymph nodes. Reproductive: There is mild prostatomegaly. Other: Small umbilical fat hernia. There is no free air, free hemorrhage, free fluid or incarcerated hernia. Musculoskeletal: Osteopenia and degenerative change lumbar spine. There is a mild chronic upper plate anterior wedge compression fracture of the L5 vertebral body. No new compression fracture seen. No acute regional skeletal findings. IMPRESSION: 1. No acute intracranial CT findings or depressed skull fractures. Chronic changes. 2. Osteopenia and degenerative change without evidence of cervical fractures or traumatic listhesis. Chronic grade 1 listhesis at 2 levels. 3. No acute trauma related findings in the chest, abdomen or pelvis. 4. 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 5. 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 Recommend annual imaging followup by CTA or MRA. This recommendation follows 2010 ACCF/AHA/AATS/ACR/ASA/SCA/SCAI/SIR/STS/SVM Guidelines for the Diagnosis and Management of Patients with Thoracic Aortic Disease. Circulation. 2010; 121: [id number]. Aortic aneurysm NOS (ICD10-I71.9) 6. 3 cm homogeneous low-density lesion anteriorly in the left kidney, probably a proteinaceous or hemorrhagic cyst but measuring above the typical density of fluid. Follow-up with MRI recommended to exclude a solid component. 7. 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 8. Constipation and diverticulosis. 9. Prostatomegaly. 10. Mild chronic compression fracture of L1. Aortic Atherosclerosis (ICD10-I70.0). Electronically Signed   By: Telford Nab M.D.   On: 10/12/2022 03:48   CT CHEST ABDOMEN PELVIS W CONTRAST  Result Date: 10/12/2022 CLINICAL DATA:  [place]  in the bathroom at home, hit the back of his head, with head and neck pain and blunt polytrauma. EXAM: CT HEAD WITHOUT CONTRAST CT CERVICAL SPINE WITHOUT CONTRAST CT  CHEST, ABDOMEN AND PELVIS WITH CONTRAST TECHNIQUE: Contiguous axial images were obtained from the base of the skull through the vertex without intravenous contrast. Multidetector CT imaging of the cervical spine was performed without intravenous contrast. Multiplanar CT image reconstructions were also generated. Multidetector CT imaging of the chest, abdomen and pelvis was performed following the standard protocol during bolus administration of intravenous contrast. RADIATION DOSE REDUCTION: This exam was performed according to the departmental dose-optimization program which includes automated exposure control, adjustment of the mA and/or kV according to patient size and/or use of iterative reconstruction technique. CONTRAST:  21m OMNIPAQUE IOHEXOL 350 MG/ML SOLN COMPARISON:  Head CT 09/17/2022, cervical spine CT 06/17/2022, chest CT without contrast 04/29/2022, and abdomen and pelvis CT no contrast 08/07/2018. Also MRI lumbar spine 08/12/2022. FINDINGS: CT HEAD FINDINGS Brain: Again noted are mild-to-moderate cerebral atrophy and atrophic ventriculomegaly, moderately advanced small-vessel disease of the cerebral white matter, relatively mild cerebellar atrophy. There is no midline shift. There is no new asymmetry concerning for an acute infarct, hemorrhage or mass. 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 Vascular: There are calcifications in the carotid siphons and distal vertebral arteries. No hyperdense central vessels. Skull: Negative for fracture or focal lesions. No visible scalp hematoma. Sinuses/Orbits: Old lens replacements. Otherwise negative orbits. Mucous retention cysts are again noted in the maxillary sinuses and mild membrane disease. Clear mastoids. Other: None. CT CERVICAL FINDINGS Alignment: 3 mm anterolisthesis, likely degenerative etiology again noted C3-4 and 2 mm retrolisthesis of C4 on 5. There is no new or further alignment abnormality. There is again noted  bone-on-bone anterior atlantodental joint space loss with osteophytosis. Skull base and vertebrae: No fracture is evident. No focal pathologic lesion. Soft tissues and spinal canal: No prevertebral fluid or swelling. No visible canal hematoma. There are moderate calcifications in both proximal cervical ICAs. Disc levels: There is partial disc space loss again at C4-5 and C5-6. Other discs are maintained in height. There is mild spondylosis. No herniated discs or cord compromise. Mild facet hypertrophy is seen with mild foraminal narrowing on the left at C2-3 at C3-4 and bilaterally at C4-5. No critical foraminal narrowing. Other:  None. 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 Left ventricular free wall and septal hypertrophy are present, calcifications in the aortic valve leaflets and minimally in the mitral ring, with three-vessel native calcific CAD. There is mild aortic atherosclerosis and tortuosity. Slight dilatation of the aortic root at the sinuses of Valsalva measures 4.1 cm. The remainder is within normal caliber limits. The great vessels are clear apart from minimal calcification in the subclavian arteries. The pulmonary arteries and veins are normal in caliber. The pulmonary arteries are centrally clear. There is no pericardial effusion. Mediastinum/Nodes: No enlarged mediastinal, hilar, or axillary lymph nodes. Thyroid gland, trachea, and esophagus demonstrate no significant findings. Lungs/Pleura: There is mild posterior atelectasis. The lungs are clear of infiltrates and contusions. No nodules are seen. There is mild chronic subpleural reticulation in the right lower lobe base. No pleural effusion, thickening or pneumothorax. Chronic asymmetric elevation right hemidiaphragm. Musculoskeletal: There is mild osteopenia with degenerative changes of the spine. No spinal compression fracture or other acute regional skeletal abnormality is seen. CT ABDOMEN PELVIS FINDINGS  Hepatobiliary: No hepatic injury or perihepatic hematoma. Gallbladder is unremarkable. No biliary  dilatation. The liver is mildly steatotic without mass. Pancreas: Generalized glandular fatty atrophy. No mass or acute abnormality. Spleen: No splenic injury or perisplenic hematoma. Adrenals/Urinary Tract: No adrenal hemorrhage or renal injury identified. Bladder is unremarkable. There is no adrenal mass. Both kidneys demonstrate scattered scar-like cortical defects. Left kidney is pelvic. There are few small nonobstructing caliceal stones in both kidneys, larger 7 mm caliceal stone in the left kidney. There are no obstructing stones either side. No hydronephrosis. There is a homogeneous low-density lesion measuring 3 cm anteriorly in the left kidney, but measuring above the typical density of fluid. Hounsfield density is 37 Hounsfield units. This probably was present on the prior noncontrast study but may have slightly increased in size. This is probably a proteinaceous or hemorrhagic cyst but warrants follow-up with MRI to exclude a solid component. Right kidney with no focal abnormality. Stomach/Bowel: No dilatation or wall thickening. An appendix is not seen in this patient. There is mild fecal stasis. Uncomplicated colonic diverticula. Dense stool in the rectosigmoid segment. Vascular/Lymphatic: Aortic atherosclerosis. No enlarged abdominal or pelvic lymph nodes. Reproductive: There is mild prostatomegaly. Other: Small umbilical fat hernia. There is no free air, free hemorrhage, free fluid or incarcerated hernia. Musculoskeletal: Osteopenia and degenerative change lumbar spine. There is a mild chronic upper plate anterior wedge compression fracture of the L5 vertebral body. No new compression fracture seen. No acute regional skeletal findings. IMPRESSION: 1. No acute intracranial CT findings or depressed skull fractures. Chronic changes. 2. Osteopenia and degenerative change without evidence of cervical fractures  or traumatic listhesis. Chronic grade 1 listhesis at 2 levels. 3. No acute trauma related findings in the chest, abdomen or pelvis. 4. 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 5. 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 Recommend annual imaging followup by CTA or MRA. This recommendation follows 2010 ACCF/AHA/AATS/ACR/ASA/SCA/SCAI/SIR/STS/SVM Guidelines for the Diagnosis and Management of Patients with Thoracic Aortic Disease. Circulation. 2010; 121: N397-Q734. Aortic aneurysm NOS (ICD10-I71.9) 6. 3 cm homogeneous low-density lesion anteriorly in the left kidney, probably a proteinaceous or hemorrhagic cyst but measuring above the typical density of fluid. Follow-up with MRI recommended to exclude a solid component. 7. 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 8. Constipation and diverticulosis. 9. Prostatomegaly. 10. Mild chronic compression fracture of L1. Aortic Atherosclerosis (ICD10-I70.0). Electronically Signed   By: Telford Nab M.D.   On: 10/12/2022 03:48   DG Pelvis Portable  Result Date: 10/12/2022 CLINICAL DATA:  Recent fall with pelvic pain, initial encounter EXAM: PORTABLE PELVIS 1 VIEWS COMPARISON:  None Available. FINDINGS: Pelvic ring is intact. No acute fracture or dislocation is noted. No soft tissue abnormality is seen. IMPRESSION: No acute abnormality noted. Electronically Signed   By: Inez Catalina M.D.   On: 10/12/2022 01:48   DG Chest Port 1 View  Result Date: 10/12/2022 CLINICAL DATA:  Recent fall with chest pain, initial encounter EXAM: PORTABLE CHEST 1 VIEW COMPARISON:  09/17/2022 FINDINGS: Cardiac shadow is stable. Postsurgical changes are again seen. Lungs are hypoinflated but clear. No acute bony abnormality is noted. IMPRESSION: No active disease. Electronically Signed   By: Inez Catalina M.D.   On: 10/12/2022 01:48    Procedures Procedures  {Document cardiac  monitor, telemetry assessment procedure when appropriate:1}  Medications Ordered in ED Medications  iohexol (OMNIPAQUE) 350 MG/ML injection 75 mL (75 mLs Intravenous Contrast Given 10/12/22 0229)  fentaNYL (SUBLIMAZE) injection 50 mcg (50 mcg Intravenous Given 10/12/22  0400)    ED Course/ Medical Decision Making/ A&P   {   Click here for ABCD2, HEART and other calculatorsREFRESH Note before signing :1}                          Medical Decision Making Amount and/or Complexity of Data Reviewed Labs: ordered. Radiology: ordered.  Risk Prescription drug management.   ***  {Document critical care time when appropriate:1} {Document review of labs and clinical decision tools ie heart score, Chads2Vasc2 etc:1}  {Document your independent review of radiology images, and any outside records:1} {Document your discussion with family members, caretakers, and with consultants:1} {Document social determinants of health affecting pt's care:1} {Document your decision making why or why not admission, treatments were needed:1} Final Clinical Impression(s) / ED Diagnoses Final diagnoses:  Fall, initial encounter  Renal lesion  Aortic root dilation ([place])  Enlarged prostate    Rx / DC Orders ED Discharge Orders     None

## 2022-10-12 NOTE — ED Notes (Signed)
[phone] pt wife called for a update and stated if their were any questions to give her a call.
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
## 2022-10-12 NOTE — Discharge Instructions (Signed)
Please have your doctor follow these up: Slight dilatation of the aortic root at the sinuses of Valsalva, 4.1 cm. Recommend annual imaging followup by CTA or MRA.  2. 3 cm homogeneous low-density lesion anteriorly in the left kidney, probably a proteinaceous or hemorrhagic cyst but measuring above the typical density of fluid. Follow-up with MRI recommended to exclude a solid component. (Soon) 3. 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 4. Prostatomegaly.

## 2022-10-13 ENCOUNTER — Ambulatory Visit: Payer: Medicare Other | Admitting: Podiatry

## 2022-10-17 ENCOUNTER — Ambulatory Visit: Payer: Medicare Other | Admitting: Podiatry

## 2022-11-13 ENCOUNTER — Other Ambulatory Visit: Payer: Self-pay | Admitting: Physician Assistant

## 2022-11-13 DIAGNOSIS — E785 Hyperlipidemia, unspecified: Secondary | ICD-10-CM

## 2022-11-13 DIAGNOSIS — I2510 Atherosclerotic heart disease of native coronary artery without angina pectoris: Secondary | ICD-10-CM

## 2022-11-13 DIAGNOSIS — Z79899 Other long term (current) drug therapy: Secondary | ICD-10-CM

## 2022-12-01 ENCOUNTER — Ambulatory Visit: Payer: Medicare Other | Admitting: Podiatry

## 2022-12-01 ENCOUNTER — Ambulatory Visit (INDEPENDENT_AMBULATORY_CARE_PROVIDER_SITE_OTHER): Payer: Medicare Other | Admitting: Podiatry

## 2022-12-01 ENCOUNTER — Encounter: Payer: Self-pay | Admitting: Podiatry

## 2022-12-01 DIAGNOSIS — M79674 Pain in right toe(s): Secondary | ICD-10-CM | POA: Diagnosis not present

## 2022-12-01 DIAGNOSIS — B351 Tinea unguium: Secondary | ICD-10-CM | POA: Diagnosis not present

## 2022-12-01 DIAGNOSIS — E1151 Type 2 diabetes mellitus with diabetic peripheral angiopathy without gangrene: Secondary | ICD-10-CM | POA: Diagnosis not present

## 2022-12-01 DIAGNOSIS — M79675 Pain in left toe(s): Secondary | ICD-10-CM | POA: Diagnosis not present

## 2022-12-01 NOTE — Progress Notes (Signed)
This patient returns to my office for at risk foot care.  This patient requires this care by a professional since this patient will be at risk due to having diabetes.  This patient is unable to cut nails himself since the patient cannot reach his nails.These nails are painful walking and wearing shoes.  This patient presents for at risk foot care today.  General Appearance  Alert, conversant and in no acute stress.  Vascular  Dorsalis pedis and posterior tibial  pulses are  weakly palpable  bilaterally.  Capillary return is within normal limits  bilaterally. Temperature is within normal limits  bilaterally.  Neurologic  Senn-Weinstein monofilament wire test within normal limits  bilaterally. Muscle power within normal limits bilaterally.  Nails Thick disfigured discolored nails with subungual debris  from hallux to fifth toes bilaterally. No evidence of bacterial infection or drainage bilaterally.  Orthopedic  No limitations of motion  feet .  No crepitus or effusions noted.  No bony pathology or digital deformities noted.  Skin  normotropic skin with no porokeratosis noted bilaterally.  No signs of infections or ulcers noted.     Onychomycosis  Pain in right toes  Pain in left toes  Consent was obtained for treatment procedures.   Mechanical debridement of nails 1-5  bilaterally performed with a nail nipper.  Filed with dremel without incident.    Return office visit   4 months                   Told patient to return for periodic foot care and evaluation due to potential at risk complications.   Gardiner Barefoot DPM

## 2022-12-24 ENCOUNTER — Inpatient Hospital Stay (HOSPITAL_COMMUNITY)
Admission: EM | Admit: 2022-12-24 | Discharge: 2023-01-01 | DRG: 521 | Disposition: A | Discharge status: Skilled Nursing Facility | Payer: Medicare Other | Attending: Internal Medicine | Admitting: Internal Medicine

## 2022-12-24 ENCOUNTER — Other Ambulatory Visit (HOSPITAL_COMMUNITY): Payer: Medicare Other

## 2022-12-24 ENCOUNTER — Other Ambulatory Visit: Payer: Self-pay

## 2022-12-24 ENCOUNTER — Emergency Department (HOSPITAL_COMMUNITY): Payer: Medicare Other

## 2022-12-24 DIAGNOSIS — N1832 Chronic kidney disease, stage 3b: Secondary | ICD-10-CM | POA: Diagnosis present

## 2022-12-24 DIAGNOSIS — Z888 Allergy status to other drugs, medicaments and biological substances status: Secondary | ICD-10-CM

## 2022-12-24 DIAGNOSIS — F32A Depression, unspecified: Secondary | ICD-10-CM | POA: Diagnosis present

## 2022-12-24 DIAGNOSIS — I5032 Chronic diastolic (congestive) heart failure: Secondary | ICD-10-CM | POA: Diagnosis present

## 2022-12-24 DIAGNOSIS — R0609 Other forms of dyspnea: Secondary | ICD-10-CM | POA: Diagnosis not present

## 2022-12-24 DIAGNOSIS — Z951 Presence of aortocoronary bypass graft: Secondary | ICD-10-CM | POA: Diagnosis not present

## 2022-12-24 DIAGNOSIS — F418 Other specified anxiety disorders: Secondary | ICD-10-CM | POA: Diagnosis present

## 2022-12-24 DIAGNOSIS — G4733 Obstructive sleep apnea (adult) (pediatric): Secondary | ICD-10-CM | POA: Diagnosis present

## 2022-12-24 DIAGNOSIS — J9601 Acute respiratory failure with hypoxia: Secondary | ICD-10-CM | POA: Diagnosis present

## 2022-12-24 DIAGNOSIS — I480 Paroxysmal atrial fibrillation: Secondary | ICD-10-CM | POA: Diagnosis present

## 2022-12-24 DIAGNOSIS — R338 Other retention of urine: Secondary | ICD-10-CM | POA: Diagnosis present

## 2022-12-24 DIAGNOSIS — E113392 Type 2 diabetes mellitus with moderate nonproliferative diabetic retinopathy without macular edema, left eye: Secondary | ICD-10-CM | POA: Diagnosis present

## 2022-12-24 DIAGNOSIS — I130 Hypertensive heart and chronic kidney disease with heart failure and stage 1 through stage 4 chronic kidney disease, or unspecified chronic kidney disease: Secondary | ICD-10-CM | POA: Diagnosis present

## 2022-12-24 DIAGNOSIS — F419 Anxiety disorder, unspecified: Secondary | ICD-10-CM | POA: Diagnosis present

## 2022-12-24 DIAGNOSIS — Z833 Family history of diabetes mellitus: Secondary | ICD-10-CM

## 2022-12-24 DIAGNOSIS — Z7901 Long term (current) use of anticoagulants: Secondary | ICD-10-CM

## 2022-12-24 DIAGNOSIS — E785 Hyperlipidemia, unspecified: Secondary | ICD-10-CM | POA: Diagnosis present

## 2022-12-24 DIAGNOSIS — Y92009 Unspecified place in unspecified non-institutional (private) residence as the place of occurrence of the external cause: Secondary | ICD-10-CM | POA: Diagnosis not present

## 2022-12-24 DIAGNOSIS — I5030 Unspecified diastolic (congestive) heart failure: Secondary | ICD-10-CM | POA: Diagnosis present

## 2022-12-24 DIAGNOSIS — I110 Hypertensive heart disease with heart failure: Secondary | ICD-10-CM | POA: Diagnosis not present

## 2022-12-24 DIAGNOSIS — E1165 Type 2 diabetes mellitus with hyperglycemia: Secondary | ICD-10-CM | POA: Diagnosis present

## 2022-12-24 DIAGNOSIS — J9811 Atelectasis: Secondary | ICD-10-CM | POA: Diagnosis present

## 2022-12-24 DIAGNOSIS — I252 Old myocardial infarction: Secondary | ICD-10-CM | POA: Diagnosis not present

## 2022-12-24 DIAGNOSIS — I4510 Unspecified right bundle-branch block: Secondary | ICD-10-CM | POA: Diagnosis present

## 2022-12-24 DIAGNOSIS — I2510 Atherosclerotic heart disease of native coronary artery without angina pectoris: Secondary | ICD-10-CM | POA: Diagnosis present

## 2022-12-24 DIAGNOSIS — E119 Type 2 diabetes mellitus without complications: Secondary | ICD-10-CM

## 2022-12-24 DIAGNOSIS — Z881 Allergy status to other antibiotic agents status: Secondary | ICD-10-CM | POA: Diagnosis not present

## 2022-12-24 DIAGNOSIS — N390 Urinary tract infection, site not specified: Secondary | ICD-10-CM | POA: Diagnosis present

## 2022-12-24 DIAGNOSIS — N401 Enlarged prostate with lower urinary tract symptoms: Secondary | ICD-10-CM | POA: Diagnosis present

## 2022-12-24 DIAGNOSIS — I509 Heart failure, unspecified: Secondary | ICD-10-CM | POA: Diagnosis not present

## 2022-12-24 DIAGNOSIS — Z79899 Other long term (current) drug therapy: Secondary | ICD-10-CM

## 2022-12-24 DIAGNOSIS — Z794 Long term (current) use of insulin: Secondary | ICD-10-CM

## 2022-12-24 DIAGNOSIS — S72001A Fracture of unspecified part of neck of right femur, initial encounter for closed fracture: Secondary | ICD-10-CM | POA: Diagnosis present

## 2022-12-24 DIAGNOSIS — N261 Atrophy of kidney (terminal): Secondary | ICD-10-CM | POA: Diagnosis present

## 2022-12-24 DIAGNOSIS — E1122 Type 2 diabetes mellitus with diabetic chronic kidney disease: Secondary | ICD-10-CM | POA: Diagnosis present

## 2022-12-24 DIAGNOSIS — Z8673 Personal history of transient ischemic attack (TIA), and cerebral infarction without residual deficits: Secondary | ICD-10-CM

## 2022-12-24 DIAGNOSIS — W010XXA Fall on same level from slipping, tripping and stumbling without subsequent striking against object, initial encounter: Secondary | ICD-10-CM | POA: Diagnosis present

## 2022-12-24 DIAGNOSIS — S72011A Unspecified intracapsular fracture of right femur, initial encounter for closed fracture: Secondary | ICD-10-CM | POA: Diagnosis present

## 2022-12-24 DIAGNOSIS — S72001D Fracture of unspecified part of neck of right femur, subsequent encounter for closed fracture with routine healing: Secondary | ICD-10-CM | POA: Diagnosis not present

## 2022-12-24 DIAGNOSIS — Z8249 Family history of ischemic heart disease and other diseases of the circulatory system: Secondary | ICD-10-CM

## 2022-12-24 DIAGNOSIS — N179 Acute kidney failure, unspecified: Secondary | ICD-10-CM | POA: Diagnosis present

## 2022-12-24 DIAGNOSIS — J449 Chronic obstructive pulmonary disease, unspecified: Secondary | ICD-10-CM | POA: Diagnosis present

## 2022-12-24 DIAGNOSIS — Z800 Family history of malignant neoplasm of digestive organs: Secondary | ICD-10-CM

## 2022-12-24 DIAGNOSIS — E1151 Type 2 diabetes mellitus with diabetic peripheral angiopathy without gangrene: Secondary | ICD-10-CM | POA: Diagnosis present

## 2022-12-24 DIAGNOSIS — E1169 Type 2 diabetes mellitus with other specified complication: Secondary | ICD-10-CM

## 2022-12-24 DIAGNOSIS — K219 Gastro-esophageal reflux disease without esophagitis: Secondary | ICD-10-CM | POA: Diagnosis present

## 2022-12-24 LAB — CBC WITH DIFFERENTIAL/PLATELET
Abs Immature Granulocytes: 0.08 10*3/uL — ABNORMAL HIGH (ref 0.00–0.07)
Basophils Absolute: 0 10*3/uL (ref 0.0–0.1)
Basophils Relative: 0 %
Eosinophils Absolute: 0.1 10*3/uL (ref 0.0–0.5)
Eosinophils Relative: 1 %
HCT: 42.3 % (ref 39.0–52.0)
Hemoglobin: 13.3 g/dL (ref 13.0–17.0)
Immature Granulocytes: 1 %
Lymphocytes Relative: 16 %
Lymphs Abs: 1.7 10*3/uL (ref 0.7–4.0)
MCH: 29.2 pg (ref 26.0–34.0)
MCHC: 31.4 g/dL (ref 30.0–36.0)
MCV: 92.8 fL (ref 80.0–100.0)
Monocytes Absolute: 0.8 10*3/uL (ref 0.1–1.0)
Monocytes Relative: 8 %
Neutro Abs: 7.8 10*3/uL — ABNORMAL HIGH (ref 1.7–7.7)
Neutrophils Relative %: 74 %
Platelets: 179 10*3/uL (ref 150–400)
RBC: 4.56 MIL/uL (ref 4.22–5.81)
RDW: 14.1 % (ref 11.5–15.5)
WBC: 10.5 10*3/uL (ref 4.0–10.5)
nRBC: 0 % (ref 0.0–0.2)

## 2022-12-24 LAB — BASIC METABOLIC PANEL
Anion gap: 10 (ref 5–15)
BUN: 19 mg/dL (ref 8–23)
CO2: 22 mmol/L (ref 22–32)
Calcium: 8.5 mg/dL — ABNORMAL LOW (ref 8.9–10.3)
Chloride: 106 mmol/L (ref 98–111)
Creatinine, Ser: 1.88 mg/dL — ABNORMAL HIGH (ref 0.61–1.24)
GFR, Estimated: 36 mL/min — ABNORMAL LOW (ref >60)
Glucose, Bld: 259 mg/dL — ABNORMAL HIGH (ref 70–99)
Potassium: 4.4 mmol/L (ref 3.5–5.1)
Sodium: 138 mmol/L (ref 135–145)

## 2022-12-24 LAB — PROTIME-INR
INR: 1.6 — ABNORMAL HIGH (ref 0.8–1.2)
Prothrombin Time: 19 seconds — ABNORMAL HIGH (ref 11.4–15.2)

## 2022-12-24 LAB — TYPE AND SCREEN
ABO/RH(D): O POS
Antibody Screen: NEGATIVE

## 2022-12-24 LAB — GLUCOSE, CAPILLARY: Glucose-Capillary: 338 mg/dL — ABNORMAL HIGH (ref 70–99)

## 2022-12-24 MED ORDER — HYDROCODONE-ACETAMINOPHEN 5-325 MG PO TABS
1.0000 | ORAL_TABLET | Freq: Four times a day (QID) | ORAL | Status: DC | PRN
  Administered 2022-12-24 – 2022-12-28 (×6): 2 via ORAL
  Administered 2022-12-29: 1 via ORAL
  Administered 2022-12-30: 2 via ORAL
  Administered 2023-01-01: 1 via ORAL
  Filled 2022-12-24 (×2): qty 1
  Filled 2022-12-24 (×7): qty 2

## 2022-12-24 MED ORDER — DULOXETINE HCL 30 MG PO CPEP
30.0000 mg | ORAL_CAPSULE | Freq: Every morning | ORAL | Status: DC
  Administered 2022-12-25 – 2022-12-26 (×2): 30 mg via ORAL
  Filled 2022-12-24 (×2): qty 1

## 2022-12-24 MED ORDER — PANTOPRAZOLE SODIUM 40 MG PO TBEC
40.0000 mg | DELAYED_RELEASE_TABLET | Freq: Every morning | ORAL | Status: DC
  Administered 2022-12-25 – 2023-01-01 (×8): 40 mg via ORAL
  Filled 2022-12-24 (×9): qty 1

## 2022-12-24 MED ORDER — HYDROMORPHONE HCL 1 MG/ML IJ SOLN
0.5000 mg | Freq: Once | INTRAMUSCULAR | Status: AC
  Administered 2022-12-24: 0.5 mg via INTRAVENOUS
  Filled 2022-12-24: qty 1

## 2022-12-24 MED ORDER — INSULIN ASPART 100 UNIT/ML IJ SOLN
0.0000 [IU] | Freq: Every day | INTRAMUSCULAR | Status: DC
  Administered 2022-12-24: 4 [IU] via SUBCUTANEOUS
  Administered 2022-12-26 – 2022-12-29 (×2): 2 [IU] via SUBCUTANEOUS

## 2022-12-24 MED ORDER — DULOXETINE HCL 60 MG PO CPEP
60.0000 mg | ORAL_CAPSULE | Freq: Every morning | ORAL | Status: DC
  Administered 2022-12-25 – 2022-12-26 (×2): 60 mg via ORAL
  Filled 2022-12-24 (×2): qty 1

## 2022-12-24 MED ORDER — FENTANYL CITRATE PF 50 MCG/ML IJ SOSY
50.0000 ug | PREFILLED_SYRINGE | INTRAMUSCULAR | Status: DC | PRN
  Administered 2022-12-24: 50 ug via INTRAVENOUS
  Filled 2022-12-24: qty 1

## 2022-12-24 MED ORDER — INSULIN ASPART 100 UNIT/ML IJ SOLN
0.0000 [IU] | Freq: Three times a day (TID) | INTRAMUSCULAR | Status: DC
  Administered 2022-12-25 (×2): 15 [IU] via SUBCUTANEOUS
  Administered 2022-12-26: 5 [IU] via SUBCUTANEOUS
  Administered 2022-12-26: 3 [IU] via SUBCUTANEOUS
  Administered 2022-12-27: 8 [IU] via SUBCUTANEOUS
  Administered 2022-12-27 (×2): 5 [IU] via SUBCUTANEOUS
  Administered 2022-12-28: 3 [IU] via SUBCUTANEOUS
  Administered 2022-12-28: 2 [IU] via SUBCUTANEOUS
  Administered 2022-12-28: 5 [IU] via SUBCUTANEOUS
  Administered 2022-12-29: 15 [IU] via SUBCUTANEOUS
  Administered 2022-12-29: 3 [IU] via SUBCUTANEOUS
  Administered 2022-12-29: 5 [IU] via SUBCUTANEOUS
  Administered 2022-12-30: 3 [IU] via SUBCUTANEOUS
  Administered 2022-12-30: 2 [IU] via SUBCUTANEOUS
  Administered 2022-12-30: 8 [IU] via SUBCUTANEOUS
  Administered 2022-12-31: 3 [IU] via SUBCUTANEOUS
  Administered 2022-12-31: 8 [IU] via SUBCUTANEOUS
  Administered 2022-12-31 – 2023-01-01 (×3): 3 [IU] via SUBCUTANEOUS

## 2022-12-24 MED ORDER — ATORVASTATIN CALCIUM 80 MG PO TABS
80.0000 mg | ORAL_TABLET | Freq: Every evening | ORAL | Status: DC
  Administered 2022-12-25 – 2022-12-31 (×7): 80 mg via ORAL
  Filled 2022-12-24 (×7): qty 1

## 2022-12-24 MED ORDER — INSULIN GLARGINE-YFGN 100 UNIT/ML ~~LOC~~ SOLN
30.0000 [IU] | Freq: Every day | SUBCUTANEOUS | Status: DC
  Administered 2022-12-25 – 2023-01-01 (×8): 30 [IU] via SUBCUTANEOUS
  Filled 2022-12-24 (×8): qty 0.3

## 2022-12-24 MED ORDER — SENNOSIDES-DOCUSATE SODIUM 8.6-50 MG PO TABS: 1.0000 | ORAL_TABLET | Freq: Every evening | ORAL | Status: DC | PRN

## 2022-12-24 MED ORDER — BUPROPION HCL ER (XL) 150 MG PO TB24
150.0000 mg | ORAL_TABLET | Freq: Every morning | ORAL | Status: DC
  Administered 2022-12-25 – 2023-01-01 (×8): 150 mg via ORAL
  Filled 2022-12-24 (×8): qty 1

## 2022-12-24 MED ORDER — HYDROMORPHONE HCL 1 MG/ML IJ SOLN
0.5000 mg | INTRAMUSCULAR | Status: DC | PRN
  Administered 2022-12-24 – 2022-12-25 (×4): 0.5 mg via INTRAVENOUS
  Filled 2022-12-24 (×4): qty 0.5

## 2022-12-24 MED ORDER — QUETIAPINE FUMARATE 25 MG PO TABS
50.0000 mg | ORAL_TABLET | Freq: Every day | ORAL | Status: DC
  Administered 2022-12-25 – 2022-12-31 (×7): 50 mg via ORAL
  Filled 2022-12-24 (×7): qty 2
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
## 2022-12-24 NOTE — Assessment & Plan Note (Signed)
Stable, denies any chest pain.  Eliquis on hold as above.

## 2022-12-24 NOTE — Assessment & Plan Note (Signed)
Continue reduced basal insulin 30 units daily plus moderate SSI.  Adjust as needed.
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
## 2022-12-24 NOTE — Assessment & Plan Note (Signed)
Stable, patient is euvolemic.  Last EF 60-65% 04/2021.  Not on diuretics as an outpatient.  Not on beta-blocker due to history of bradycardia.  Not on ACE/ARB/Spiro due to CKD.

## 2022-12-24 NOTE — Assessment & Plan Note (Addendum)
Continue Cymbalta, Bupropion, and Seroquel.

## 2022-12-24 NOTE — ED Notes (Signed)
ED TO INPATIENT HANDOFF REPORT  ED Nurse Name and Phone #: Reef Achterberg [phone]  S Name/Age/Gender Fernando Hess 77 y.o. male Room/Bed: H021C/H021C  Code Status   Code Status: Prior  Home/SNF/Other Home Patient oriented to: self, place, time, and situation Is this baseline? Yes   Triage Complete: Triage complete  Chief Complaint Fracture of femoral neck, right, closed [S72.001A]  Triage Note Pt BIBA from home for right hip after falling while transferring from chair to walker. No LOC, no head injury, no shortening or rotation. Currently out of short acting insulin. VSS, GCS 15.   Allergies Allergies  Allergen Reactions   Cilostazol Swelling and Other (See Comments)    Edema    Dulaglutide Nausea And Vomiting and Other (See Comments)    TRULICITY   Levofloxacin Hives, Itching and Rash   Liraglutide Other (See Comments)    Severe fatigue & insomnia   Lisinopril Itching, Rash and Cough    Level of Care/Admitting Diagnosis ED Disposition     ED Disposition  Admit   Condition  --   Comment  [facility] [[id number]]  Level of Care: Med-Surg [16]  [facility] or [facility] if equivalent level of care is available:: No  Covid Evaluation: Asymptomatic - no recent exposure (last 10 days) testing not required  Diagnosis: Fracture of femoral neck, right, closed [[id number]]  Admitting Physician: Charlsie QuestPATEL, VISHAL R [[id number]]  Attending Physician: Charlsie QuestPATEL, VISHAL R [[id number]]  Certification:: I certify this patient will need inpatient services for at least 2 midnights  Estimated Length of Stay: 5          B Medical/Surgery History Past Medical History:  Diagnosis Date   Acute renal failure (ARF) ([name]) 09/24/2015   Acute stress disorder 11/13/2021   Atrial premature complexes    CAD (coronary artery disease) of artery bypass graft    Early occlusion of saphenous vein graft to intermediate and marginal branch in February 2007  following bypass grafting    CAD (coronary artery disease), native coronary artery 2017   hx NSTEMI 09-24-2015  s/p  CABG x5 on 10-02-2015;  post op STEMI inferolateral wall,  SVG OM1 and SVG OM2 occluded, distal OM occlusion the calpruit, treated medically // Myoview 7/21: no ischemia, EF 65, low risk   CHF (congestive heart failure) ([name])    CKD (chronic kidney disease), stage III ([name])    Contusion of right knee 03/16/2020   Dyspnea    Elevated troponin    Erectile dysfunction    Esophageal reflux    History of atrial fibrillation    post op CABG 10-02-2015   History of kidney stones    History of non-ST elevation myocardial infarction (NSTEMI) 09/24/2015   s/p  CABG x5   History of ST elevation myocardial infarction (STEMI) 10/22/2015   inferior wall,  post op CABG 10-02-2015   Hyperlipidemia    Hypertension    Left ureteral stone    Mild atherosclerosis of both carotid arteries    Nephrolithiasis    per CT bilateral non-obstructive calculi   OSA (obstructive sleep apnea)    Peripheral artery disease    LE Arterial US 01/2019: R PTA and ATA occluded; L ATA occluded   RBBB (right bundle branch block)    Renal atrophy, right    Sleep apnea    wears cpap    ST elevation myocardial infarction (STEMI) of inferior wall ([name]) 10/22/2015   Type 2 diabetes mellitus treated  with insulin ([name])    followed by pcp   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) 03/01/2008   Wears glasses    Past Surgical History:  Procedure Laterality Date   APPENDECTOMY  1965   CARDIAC CATHETERIZATION N/A 09/26/2015   Procedure: Left Heart Cath and Coronary Angiography;  Surgeon: Lennette Bihari, MD;  Location: [facility] INVASIVE CV LAB;  Service: Cardiovascular;  Laterality: N/A;   CARDIAC CATHETERIZATION N/A 10/22/2015   Procedure: Left Heart Cath and Coronary Angiography;  Surgeon: Tonny Bollman, MD;  Location: [facility] INVASIVE CV LAB;   Service: Cardiovascular;  Laterality: N/A;   CATARACT EXTRACTION W/ INTRAOCULAR LENS  IMPLANT, BILATERAL  2017   COLONOSCOPY     CORONARY ARTERY BYPASS GRAFT N/A 10/02/2015   Procedure: CORONARY ARTERY BYPASS GRAFTING (CABG) X5 LIMA-LAD; SVG-DIAG; SVG-OM; SVG-PD; SVG-RAMUS TRANSESOPHAGEAL ECHOCARDIOGRAM (TEE) ENDOSCOPIC GREATER SAPHENOUS VEIN  HARVEST BILAT LE;  Surgeon: Kerin Perna, MD;  Location: [facility] OR;  Service: Open Heart Surgery;  Laterality: N/A;   CYSTOSCOPY/URETEROSCOPY/HOLMIUM LASER/STENT PLACEMENT Left 08/10/2018   Procedure: CYSTOSCOPY/URETEROSCOPY/HOLMIUM LASER/STENT PLACEMENT;  Surgeon: Jerilee Field, MD;  Location: [facility];  Service: Urology;  Laterality: Left;   CYSTOSCOPY/URETEROSCOPY/HOLMIUM LASER/STENT PLACEMENT Left 09/10/2018   Procedure: CYSTOSCOPY/URETEROSCOPY/HOLMIUM LASER/STENT EXCHANGE;  Surgeon: Jerilee Field, MD;  Location: [facility] ORS;  Service: Urology;  Laterality: Left;   LEFT HEART CATHETERIZATION WITH CORONARY ANGIOGRAM N/A 04/13/2014   Procedure: LEFT HEART CATHETERIZATION WITH CORONARY ANGIOGRAM;  Surgeon: Othella Boyer, MD;  Location: [facility] CATH LAB;  Service: Cardiovascular;  Laterality: N/A;   LEG SURGERY Right age 32   closed reduction leg fracture   NASAL SEPTOPLASTY W/ TURBINOPLASTY Bilateral 08/30/2021   Procedure: NASAL SEPTOPLASTY WITH BILATERAL INFERIOR TURBINATE REDUCTION;  Surgeon: Osborn Coho, MD;  Location: [facility] OR;  Service: ENT;  Laterality: Bilateral;   POLYPECTOMY     RIGHT HEART CATH N/A 06/06/2021   Procedure: RIGHT HEART CATH;  Surgeon: Dolores Patty, MD;  Location: [facility] INVASIVE CV LAB;  Service: Cardiovascular;  Laterality: N/A;   TEE WITHOUT CARDIOVERSION N/A 10/02/2015   Procedure: TRANSESOPHAGEAL ECHOCARDIOGRAM (TEE);  Surgeon: Kerin Perna, MD;  Location: [facility] OR;  Service: Open Heart Surgery;  Laterality: N/A;   URETEROSCOPY WITH HOLMIUM LASER LITHOTRIPSY Bilateral 2004;  2005  dr Isabel Caprice  @[email or web address]     VASECTOMY       A IV Location/Drains/Wounds Patient Lines/Drains/Airways Status     Active Line/Drains/Airways     Name Placement date Placement time Site Days   Peripheral IV 12/24/22 20 G Anterior;Distal;Left;Upper Arm 12/24/22  1615  Arm  less than 1            Intake/Output Last 24 hours No intake or output data in the 24 hours ending 12/24/22 1831  Labs/Imaging Results for orders placed or performed during the hospital encounter of 12/24/22 (from the past 48 hour(s))  Basic metabolic panel     Status: Abnormal   Collection Time: 12/24/22  4:12 PM  Result Value Ref Range   Sodium 138 135 - 145 mmol/L   Potassium 4.4 3.5 - 5.1 mmol/L   Chloride 106 98 - 111 mmol/L   CO2 22 22 - 32 mmol/L   Glucose, Bld 259 (H) 70 - 99 mg/dL    Comment: Glucose reference range applies only to samples taken after fasting for at least 8 hours.   BUN 19 8 - 23 mg/dL   Creatinine, Ser 0.10 (H) 0.61 -  1.24 mg/dL   Calcium 8.5 (L) 8.9 - 10.3 mg/dL   GFR, Estimated 36 (L) >60 mL/min    Comment: (NOTE) Calculated using the CKD-EPI Creatinine Equation (2021)    Anion gap 10 5 - 15    Comment: Performed at [facility], [address] [place], [name] [address]  CBC with Differential     Status: Abnormal   Collection Time: 12/24/22  4:12 PM  Result Value Ref Range   WBC 10.5 4.0 - 10.5 K/uL   RBC 4.56 4.22 - 5.81 MIL/uL   Hemoglobin 13.3 13.0 - 17.0 g/dL   HCT 60.4 54.0 - 98.1 %   MCV 92.8 80.0 - 100.0 fL   MCH 29.2 26.0 - 34.0 pg   MCHC 31.4 30.0 - 36.0 g/dL   RDW 19.1 47.8 - 29.5 %   Platelets 179 150 - 400 K/uL   nRBC 0.0 0.0 - 0.2 %   Neutrophils Relative % 74 %   Neutro Abs 7.8 (H) 1.7 - 7.7 K/uL   Lymphocytes Relative 16 %   Lymphs Abs 1.7 0.7 - 4.0 K/uL   Monocytes Relative 8 %   Monocytes Absolute 0.8 0.1 - 1.0 K/uL   Eosinophils Relative 1 %   Eosinophils Absolute 0.1 0.0 - 0.5 K/uL   Basophils Relative 0 %   Basophils Absolute 0.0 0.0 - 0.1 K/uL   Immature  Granulocytes 1 %   Abs Immature Granulocytes 0.08 (H) 0.00 - 0.07 K/uL    Comment: Performed at [facility], [address] [place], [name] [address]  Protime-INR     Status: Abnormal   Collection Time: 12/24/22  4:12 PM  Result Value Ref Range   Prothrombin Time 19.0 (H) 11.4 - 15.2 seconds   INR 1.6 (H) 0.8 - 1.2    Comment: (NOTE) INR goal varies based on device and disease states. Performed at [facility], [address] [place], [name] [address]   Type and screen [facility]     Status: None   Collection Time: 12/24/22  4:14 PM  Result Value Ref Range   ABO/RH(D) O POS    Antibody Screen NEG    Sample Expiration      12/27/2022,2359 Performed at [facility], [address] [place], [name] [address]    DG Chest 1 View  Result Date: 12/24/2022 CLINICAL DATA:  Hip fracture EXAM: CHEST  1 VIEW COMPARISON:  Chest x-ray dated October 12, 2022 FINDINGS: Cardiac and mediastinal contours are unchanged post median sternotomy and CABG. Elevation of the right hemidiaphragm and right basilar atelectasis. No evidence of pleural effusion or pneumothorax. IMPRESSION: Elevation of the right hemidiaphragm and right basilar atelectasis. Electronically Signed   By: Allegra Lai M.D.   On: 12/24/2022 17:17   DG Hip Unilat W or Wo Pelvis 2-3 Views Right  Result Date: 12/24/2022 CLINICAL DATA:  Fall EXAM: DG HIP (WITH OR WITHOUT PELVIS) 2-3V RIGHT COMPARISON:  None Available. FINDINGS: There is an acute impacted subcapital right femoral neck fracture. No dislocation. The bones are osteopenic. No significant displacement. IMPRESSION: Acute impacted subcapital right femoral neck fracture. Electronically Signed   By: Darliss Cheney M.D.   On: 12/24/2022 15:57    Pending Labs Unresulted Labs (From admission, onward)    None       Vitals/Pain Today's Vitals   12/24/22 1505 12/24/22 1715 12/24/22 1822 12/24/22 1822  BP: (!) 156/78   (!) 131/112  Pulse: 78   (!) 107  Resp: 16   18  Temp: 98.8 F (37.1 C)   98.6 F (37 C)  TempSrc: Oral   Oral  SpO2: 95%   92%  Weight:      Height:      PainSc:  9  10-Worst pain ever     Isolation Precautions No active isolations  Medications Medications  fentaNYL (SUBLIMAZE) injection 50 mcg (50 mcg Intravenous Given 12/24/22 1620)  HYDROmorphone (DILAUDID) injection 0.5 mg (0.5 mg Intravenous Given 12/24/22 1746)    Mobility walks with device     Focused Assessments    R Recommendations: See Admitting Provider Note  Report given to:   Additional Notes:

## 2022-12-24 NOTE — ED Triage Notes (Addendum)
Pt BIBA from home for right hip after falling while transferring from chair to walker. No LOC, no head injury, no shortening or rotation. Currently out of short acting insulin. VSS, GCS 15.

## 2022-12-24 NOTE — Assessment & Plan Note (Signed)
Stable in sinus rhythm on admission.  Eliquis on hold as above.  Not on rate controlling agents due to history of bradycardia.

## 2022-12-24 NOTE — ED Notes (Signed)
Pt states he has right hip pain from falling.  No LOC.  Pt does have a bruise and scabbing inside left inner thigh from previous fall.

## 2022-12-24 NOTE — Hospital Course (Signed)
Fernando Hess is a 77 y.o. male with medical history significant for CAD s/p CABG, PAF on Eliquis,, HFpEF, CKD stage IIIb, insulin-dependent T2DM, depression/anxiety, OSA not on CPAP, cognitive impairment who is admitted with a right hip fracture.

## 2022-12-24 NOTE — Assessment & Plan Note (Signed)
Occurring after mechanical fall.  EDP spoke with [facility] Dr. Aundria Rud, possible surgical intervention 4/12 per their discussion. -Surgical management per orthopedics -Hold Eliquis for now -Continue analgesics as needed

## 2022-12-24 NOTE — Assessment & Plan Note (Signed)
Renal function at baseline 

## 2022-12-25 ENCOUNTER — Inpatient Hospital Stay (HOSPITAL_COMMUNITY): Payer: Medicare Other

## 2022-12-25 DIAGNOSIS — S72001A Fracture of unspecified part of neck of right femur, initial encounter for closed fracture: Secondary | ICD-10-CM | POA: Diagnosis not present

## 2022-12-25 LAB — BASIC METABOLIC PANEL
Anion gap: 8 (ref 5–15)
BUN: 23 mg/dL (ref 8–23)
CO2: 22 mmol/L (ref 22–32)
Calcium: 8.5 mg/dL — ABNORMAL LOW (ref 8.9–10.3)
Chloride: 104 mmol/L (ref 98–111)
Creatinine, Ser: 2.19 mg/dL — ABNORMAL HIGH (ref 0.61–1.24)
GFR, Estimated: 30 mL/min — ABNORMAL LOW (ref >60)
Glucose, Bld: 379 mg/dL — ABNORMAL HIGH (ref 70–99)
Potassium: 4.6 mmol/L (ref 3.5–5.1)
Sodium: 134 mmol/L — ABNORMAL LOW (ref 135–145)

## 2022-12-25 LAB — CBC
HCT: 43.5 % (ref 39.0–52.0)
Hemoglobin: 14 g/dL (ref 13.0–17.0)
MCH: 29.1 pg (ref 26.0–34.0)
MCHC: 32.2 g/dL (ref 30.0–36.0)
MCV: 90.4 fL (ref 80.0–100.0)
Platelets: 183 10*3/uL (ref 150–400)
RBC: 4.81 MIL/uL (ref 4.22–5.81)
RDW: 14.1 % (ref 11.5–15.5)
WBC: 13.6 10*3/uL — ABNORMAL HIGH (ref 4.0–10.5)
nRBC: 0 % (ref 0.0–0.2)

## 2022-12-25 LAB — GLUCOSE, CAPILLARY
Glucose-Capillary: 319 mg/dL — ABNORMAL HIGH (ref 70–99)
Glucose-Capillary: 430 mg/dL — ABNORMAL HIGH (ref 70–99)
Glucose-Capillary: 384 mg/dL — ABNORMAL HIGH (ref 70–99)
Glucose-Capillary: 202 mg/dL — ABNORMAL HIGH (ref 70–99)
Glucose-Capillary: 109 mg/dL — ABNORMAL HIGH (ref 70–99)
Glucose-Capillary: 134 mg/dL — ABNORMAL HIGH (ref 70–99)

## 2022-12-25 MED ORDER — IPRATROPIUM BROMIDE 0.02 % IN SOLN: 0.5000 mg | Freq: Four times a day (QID) | RESPIRATORY_TRACT | Status: DC | PRN

## 2022-12-25 MED ORDER — SODIUM CHLORIDE 0.9 % IV SOLN: INTRAVENOUS | Status: DC

## 2022-12-25 NOTE — Plan of Care (Addendum)
1700: RN noticed pt had not voided during shift, RN spoke with pt, pt stated he tried but unable to void, RN did not see any precious documented urine occurrences. RN bladder scanned pt and pt having 398 ml in bladder. Pt tried multiple attempts to void on his own with urinal but pt unable. RN assisted pt with switching to the sitting position to see if that would help pt void but pt still unable to void and pt having a difficult time switching positions. RN notified Dr. Lorin Glass MD. Per MD okay to straight cath pt. RN attempted to straight-cath pt but was unsuccessful, RN met resistance and unable to advance past a certain point, pt uncomfortable, and when RN pulled out cath, there was a small amount of blood in the tip of the catheter. RN notified MD, per MD request charge RN attempted to cath pt, but unable to advance past a certain point, when cath was pulled there was blood at the tip of catheter, per MD charge RN to place foley.   Pt pending surgery, per Charma Igo PA, pt surgery planned for 4/13, pt okay to eat tonight and tomorrow pt will be NPO midnight of 4/13

## 2022-12-25 NOTE — Progress Notes (Signed)
Initial Nutrition Assessment  DOCUMENTATION CODES:   Not applicable  INTERVENTION:  Once diet resumes, recommend: Carb modified diet to help with blood sugar control Glucerna Shake po TID, each supplement provides 220 kcal and 10 grams of protein MVI with minerals daily  NUTRITION DIAGNOSIS:   Increased nutrient needs related to hip fracture, post-op healing as evidenced by estimated needs.  GOAL:   Patient will meet greater than or equal to 90% of their needs  MONITOR:   PO intake, Supplement acceptance, Labs, Weight trends  REASON FOR ASSESSMENT:   Consult Hip fracture protocol  ASSESSMENT:   Pt admitted with R femoral neck fracture after a fall. PMH significant for CAD s/p CABG, PAF, HFpEF, CKD IIIb, IDDM, depression/anxiety, OSA, cognitive impairment.   Plans for surgical intervention today.   Briefly spoke with pt at bedside. He is a limited historian. History also limited by RN obtaining surgical consent and pt being taken to the OR.   Pt states that his appetite has been "light" for the past 5 years d/t a "disease" that he couldn't remember. When asked if it was his heart, he replied "yes."  He states that he eats 2 meals a day and his recall includes banana pudding and apple pie.   Meal completions: 4/11: 75% breakfast, 50% lunch (heart healthy/carb modified)  Though pt states that his weight has remained stable at 175 lbs, per review of chart, pt's weight noted to have been in decline within the last year. Pt's documented weight on 01/22/22 was 85.5 kg. Weight on 09/22/22 was 79.7 kg. This is a weight loss of 6.8% which is not clinically significant for time frame. Admit weight documented to be 68 kg however uncertain whether this is actual or stated weight.   Medications:  SSI 0-15 units TID, SSI 0-5 units qhs, semglee 30 units daily, protonix, IV drips  Labs: BUN 39, Cr 2.61, GFR 24, CBG's 109-430 x24 hours  NUTRITION - FOCUSED PHYSICAL EXAM: Pt being  taken for surgery. Deferred to follow up.   Diet Order:   Diet Order             Diet NPO time specified  Diet effective midnight                   EDUCATION NEEDS:   No education needs have been identified at this time  Skin:  Skin Assessment: Reviewed RN Assessment  Last BM:  4/11 (type 7 x2)  Height:   Ht Readings from Last 1 Encounters:  12/26/22 5\' 6"  (1.676 m)    Weight:   Wt Readings from Last 1 Encounters:  12/26/22 69 kg   BMI:  Body mass index is 24.55 kg/m.  Estimated Nutritional Needs:   Kcal:  1800-2000  Protein:  90-105g  Fluid:  >/=1.8L  Drusilla Kanner, RDN, LDN Clinical Nutrition

## 2022-12-25 NOTE — Consult Note (Signed)
Reason for Consult:Right hip fx Referring Physician: Melina SchoolsBinaya Dahal Time called: [id number] Time at bedside: 0847   Delene RuffiniLarry W Hess is an 77 y.o. male.  HPI: Peyton NajjarLarry fell at home yesterday during the day. He had immediate pain in his right hip and could not get up. He was brought to the ED where x-rays showed a right hip fx and orthopedic surgery was consulted. He had fallen the day before as well and bruised his thigh pretty good when his RW hit it. He lives at home with his wife.  Past Medical History:  Diagnosis Date   Acute renal failure (ARF) ([name]) 09/24/2015   Acute stress disorder 11/13/2021   Atrial premature complexes    CAD (coronary artery disease) of artery bypass graft    Early occlusion of saphenous vein graft to intermediate and marginal branch in February 2007 following bypass grafting    CAD (coronary artery disease), native coronary artery 2017   hx NSTEMI 09-24-2015  s/p  CABG x5 on 10-02-2015;  post op STEMI inferolateral wall,  SVG OM1 and SVG OM2 occluded, distal OM occlusion the calpruit, treated medically // Myoview 7/21: no ischemia, EF 65, low risk   CHF (congestive heart failure) ([name])    CKD (chronic kidney disease), stage III ([name])    Contusion of right knee 03/16/2020   Dyspnea    Elevated troponin    Erectile dysfunction    Esophageal reflux    History of atrial fibrillation    post op CABG 10-02-2015   History of kidney stones    History of non-ST elevation myocardial infarction (NSTEMI) 09/24/2015   s/p  CABG x5   History of ST elevation myocardial infarction (STEMI) 10/22/2015   inferior wall,  post op CABG 10-02-2015   Hyperlipidemia    Hypertension    Left ureteral stone    Mild atherosclerosis of both carotid arteries    Nephrolithiasis    per CT bilateral non-obstructive calculi   OSA (obstructive sleep apnea)    Peripheral artery disease    LE Arterial US 01/2019: R PTA and ATA occluded; L ATA occluded   RBBB (right bundle branch block)    Renal  atrophy, right    Sleep apnea    wears cpap    ST elevation myocardial infarction (STEMI) of inferior wall ([name]) 10/22/2015   Type 2 diabetes mellitus treated with insulin ([name])    followed by pcp   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) 03/01/2008   Wears glasses     Past Surgical History:  Procedure Laterality Date   APPENDECTOMY  1965   CARDIAC CATHETERIZATION N/A 09/26/2015   Procedure: Left Heart Cath and Coronary Angiography;  Surgeon: Lennette Biharihomas A Kelly, MD;  Location: [facility];  Service: Cardiovascular;  Laterality: N/A;   CARDIAC CATHETERIZATION N/A 10/22/2015   Procedure: Left Heart Cath and Coronary Angiography;  Surgeon: Tonny BollmanMichael Cooper, MD;  [facility];  Service: Cardiovascular;  Laterality: N/A;   CATARACT EXTRACTION W/ INTRAOCULAR LENS  IMPLANT, BILATERAL  2017   COLONOSCOPY     CORONARY ARTERY BYPASS GRAFT N/A 10/02/2015   Procedure: CORONARY ARTERY BYPASS GRAFTING (CABG) X5 LIMA-LAD; SVG-DIAG; SVG-OM; SVG-PD; SVG-RAMUS TRANSESOPHAGEAL ECHOCARDIOGRAM (TEE) ENDOSCOPIC GREATER SAPHENOUS VEIN  HARVEST BILAT LE;  Surgeon: Kerin PernaPeter Van Trigt, MD;  Location: [facility] OR;  Service: Open Heart Surgery;  Laterality: N/A;   CYSTOSCOPY/URETEROSCOPY/HOLMIUM LASER/STENT PLACEMENT Left 08/10/2018   Procedure: CYSTOSCOPY/URETEROSCOPY/HOLMIUM LASER/STENT PLACEMENT;  Surgeon: Jerilee Field, MD;  Location: [facility];  Service: Urology;  Laterality: Left;   CYSTOSCOPY/URETEROSCOPY/HOLMIUM LASER/STENT PLACEMENT Left 09/10/2018   Procedure: CYSTOSCOPY/URETEROSCOPY/HOLMIUM LASER/STENT EXCHANGE;  Surgeon: Jerilee Field, MD;  Location: [facility];  Service: Urology;  Laterality: Left;   LEFT HEART CATHETERIZATION WITH CORONARY ANGIOGRAM N/A 04/13/2014   Procedure: LEFT HEART CATHETERIZATION WITH CORONARY ANGIOGRAM;  Surgeon: Othella Boyer, MD;  Location: [facility] CATH LAB;  Service:  Cardiovascular;  Laterality: N/A;   LEG SURGERY Right age 44   closed reduction leg fracture   NASAL SEPTOPLASTY W/ TURBINOPLASTY Bilateral 08/30/2021   Procedure: NASAL SEPTOPLASTY WITH BILATERAL INFERIOR TURBINATE REDUCTION;  Surgeon: Osborn Coho, MD;  Location: [facility] OR;  Service: ENT;  Laterality: Bilateral;   POLYPECTOMY     RIGHT HEART CATH N/A 06/06/2021   Procedure: RIGHT HEART CATH;  Surgeon: Dolores Patty, MD;  Location: [facility];  Service: Cardiovascular;  Laterality: N/A;   TEE WITHOUT CARDIOVERSION N/A 10/02/2015   Procedure: TRANSESOPHAGEAL ECHOCARDIOGRAM (TEE);  Surgeon: Kerin Perna, MD;  Location: [facility] OR;  Service: Open Heart Surgery;  Laterality: N/A;   URETEROSCOPY WITH HOLMIUM LASER LITHOTRIPSY Bilateral 2004;  2005  dr grapey  @[facility]    VASECTOMY      Family History  Problem Relation Age of Onset   Heart attack Mother    Heart attack Father    Diabetes Brother    Pancreatic cancer Brother    Diabetes Brother    Colon cancer Neg Hx    Esophageal cancer Neg Hx    Prostate cancer Neg Hx    Rectal cancer Neg Hx    Stomach cancer Neg Hx    Colon polyps Neg Hx     Social History:  reports that he has never smoked. He has never used smokeless tobacco. He reports that he does not currently use alcohol. He reports that he does not use drugs.  Allergies:  Allergies  Allergen Reactions   Cilostazol Swelling and Other (See Comments)    Edema    Dulaglutide Nausea And Vomiting and Other (See Comments)    TRULICITY   Levofloxacin Hives, Itching and Rash   Liraglutide Other (See Comments)    Severe fatigue & insomnia   Lisinopril Itching, Rash and Cough    Medications: I have reviewed the patient's current medications.  Results for orders placed or performed during the hospital encounter of 12/24/22 (from the past 48 hour(s))  Basic metabolic panel     Status: Abnormal   Collection Time: 12/24/22  4:12 PM  Result Value Ref Range   Sodium 138 135  - 145 mmol/L   Potassium 4.4 3.5 - 5.1 mmol/L   Chloride 106 98 - 111 mmol/L   CO2 22 22 - 32 mmol/L   Glucose, Bld 259 (H) 70 - 99 mg/dL    Comment: Glucose reference range applies only to samples taken after fasting for at least 8 hours.   BUN 19 8 - 23 mg/dL   Creatinine, Ser 0.34 (H) 0.61 - 1.24 mg/dL   Calcium 8.5 (L) 8.9 - 10.3 mg/dL   GFR, Estimated 36 (L) >60 mL/min    Comment: (NOTE) Calculated using the CKD-EPI Creatinine Equation (2021)    Anion gap 10 5 - 15    Comment: Performed at [facility], [address] [place], [name] [address]  CBC with Differential     Status: Abnormal   Collection Time: 12/24/22  4:12 PM  Result Value  Ref Range   WBC 10.5 4.0 - 10.5 K/uL   RBC 4.56 4.22 - 5.81 MIL/uL   Hemoglobin 13.3 13.0 - 17.0 g/dL   HCT 45.0 38.8 - 82.8 %   MCV 92.8 80.0 - 100.0 fL   MCH 29.2 26.0 - 34.0 pg   MCHC 31.4 30.0 - 36.0 g/dL   RDW 00.3 49.1 - 79.1 %   Platelets 179 150 - 400 K/uL   nRBC 0.0 0.0 - 0.2 %   Neutrophils Relative % 74 %   Neutro Abs 7.8 (H) 1.7 - 7.7 K/uL   Lymphocytes Relative 16 %   Lymphs Abs 1.7 0.7 - 4.0 K/uL   Monocytes Relative 8 %   Monocytes Absolute 0.8 0.1 - 1.0 K/uL   Eosinophils Relative 1 %   Eosinophils Absolute 0.1 0.0 - 0.5 K/uL   Basophils Relative 0 %   Basophils Absolute 0.0 0.0 - 0.1 K/uL   Immature Granulocytes 1 %   Abs Immature Granulocytes 0.08 (H) 0.00 - 0.07 K/uL    Comment: Performed at [facility], [address] [place], [name] [address]  Protime-INR     Status: Abnormal   Collection Time: 12/24/22  4:12 PM  Result Value Ref Range   Prothrombin Time 19.0 (H) 11.4 - 15.2 seconds   INR 1.6 (H) 0.8 - 1.2    Comment: (NOTE) INR goal varies based on device and disease states. Performed at [facility], [address], [place], [name] [address]   Type and screen [facility]     Status: None   Collection Time: 12/24/22  4:14 PM  Result Value Ref Range   ABO/RH(D) O  POS    Antibody Screen NEG    Sample Expiration      12/27/2022,2359 Performed at [facility], [address] [place], [name] [address]   Glucose, capillary     Status: Abnormal   Collection Time: 12/24/22 11:11 PM  Result Value Ref Range   Glucose-Capillary 338 (H) 70 - 99 mg/dL    Comment: Glucose reference range applies only to samples taken after fasting for at least 8 hours.  Basic metabolic panel     Status: Abnormal   Collection Time: 12/25/22  1:26 AM  Result Value Ref Range   Sodium 134 (L) 135 - 145 mmol/L   Potassium 4.6 3.5 - 5.1 mmol/L   Chloride 104 98 - 111 mmol/L   CO2 22 22 - 32 mmol/L   Glucose, Bld 379 (H) 70 - 99 mg/dL    Comment: Glucose reference range applies only to samples taken after fasting for at least 8 hours.   BUN 23 8 - 23 mg/dL   Creatinine, Ser 7.48 (H) 0.61 - 1.24 mg/dL   Calcium 8.5 (L) 8.9 - 10.3 mg/dL   GFR, Estimated 30 (L) >60 mL/min    Comment: (NOTE) Calculated using the CKD-EPI Creatinine Equation (2021)    Anion gap 8 5 - 15    Comment: Performed at [facility], [address] [place], [name] [address]  CBC     Status: Abnormal   Collection Time: 12/25/22  1:26 AM  Result Value Ref Range   WBC 13.6 (H) 4.0 - 10.5 K/uL   RBC 4.81 4.22 - 5.81 MIL/uL   Hemoglobin 14.0 13.0 - 17.0 g/dL   HCT 67.5 44.9 - 20.1 %   MCV 90.4 80.0 - 100.0 fL   MCH 29.1 26.0 - 34.0  pg   MCHC 32.2 30.0 - 36.0 g/dL   RDW 95.614.1 21.311.5 - 08.615.5 %   Platelets 183 150 - 400 K/uL   nRBC 0.0 0.0 - 0.2 %    Comment: Performed at [facility], [address] [place], [place] [id number]  Glucose, capillary     Status: Abnormal   Collection Time: 12/25/22  6:23 AM  Result Value Ref Range   Glucose-Capillary 319 (H) 70 - 99 mg/dL    Comment: Glucose reference range applies only to samples taken after fasting for at least 8 hours.  Glucose, capillary     Status: Abnormal   Collection Time: 12/25/22  8:49 AM  Result Value Ref Range    Glucose-Capillary 430 (H) 70 - 99 mg/dL    Comment: Glucose reference range applies only to samples taken after fasting for at least 8 hours.    DG Chest 1 View  Result Date: 12/24/2022 CLINICAL DATA:  Hip fracture EXAM: CHEST  1 VIEW COMPARISON:  Chest x-ray dated October 12, 2022 FINDINGS: Cardiac and mediastinal contours are unchanged post median sternotomy and CABG. Elevation of the right hemidiaphragm and right basilar atelectasis. No evidence of pleural effusion or pneumothorax. IMPRESSION: Elevation of the right hemidiaphragm and right basilar atelectasis. Electronically Signed   By: Allegra LaiLeah  Strickland M.D.   On: 12/24/2022 17:17   DG Hip Unilat W or Wo Pelvis 2-3 Views Right  Result Date: 12/24/2022 CLINICAL DATA:  Fall EXAM: DG HIP (WITH OR WITHOUT PELVIS) 2-3V RIGHT COMPARISON:  None Available. FINDINGS: There is an acute impacted subcapital right femoral neck fracture. No dislocation. The bones are osteopenic. No significant displacement. IMPRESSION: Acute impacted subcapital right femoral neck fracture. Electronically Signed   By: Darliss CheneyAmy  Guttmann M.D.   On: 12/24/2022 15:57    Review of Systems  HENT:  Negative for ear discharge, ear pain, hearing loss and tinnitus.   Eyes:  Negative for photophobia and pain.  Respiratory:  Negative for cough and shortness of breath.   Cardiovascular:  Negative for chest pain.  Gastrointestinal:  Negative for abdominal pain, nausea and vomiting.  Genitourinary:  Negative for dysuria, flank pain, frequency and urgency.  Musculoskeletal:  Positive for arthralgias (Right hip). Negative for back pain, myalgias and neck pain.  Neurological:  Negative for dizziness and headaches.  Hematological:  Does not bruise/bleed easily.  Psychiatric/Behavioral:  The patient is not nervous/anxious.    Blood pressure (!) 144/79, pulse 98, temperature 97.7 F (36.5 C), temperature source Oral, resp. rate 18, height 5\' 6"  (1.676 m), weight 68 kg, SpO2 94 %. Physical  Exam Constitutional:      General: He is not in acute distress.    Appearance: He is well-developed. He is not diaphoretic.  HENT:     Head: Normocephalic and atraumatic.  Eyes:     General: No scleral icterus.       Right eye: No discharge.        Left eye: No discharge.     Conjunctiva/sclera: Conjunctivae normal.  Cardiovascular:     Rate and Rhythm: Normal rate and regular rhythm.  Pulmonary:     Effort: Pulmonary effort is normal. No respiratory distress.  Musculoskeletal:     Cervical back: Normal range of motion.     Comments: RLE No traumatic wounds or rash, thigh ecchymotic  Mod TTP  No knee or ankle effusion  Knee stable to varus/ valgus and anterior/posterior stress  Sens DPN, SPN, TN intact  Motor EHL, ext,  flex, evers 5/5  DP 2+, PT 2+, No significant edema  Skin:    General: Skin is warm and dry.  Neurological:     Mental Status: He is alert.  Psychiatric:        Mood and Affect: Mood normal.        Behavior: Behavior normal.    Assessment/Plan: Right hip fx -- Plan hip hemi Saturday with Dr. Dion SaucierLandau. Please keep NPO after MN tomorrow. Multiple medical problems including CAD s/p CABG, PAF on Eliquis,, HFpEF, CKD stage IIIb, insulin-dependent T2DM, depression/anxiety, OSA not on CPAP, and cognitive impairment. 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 Roneshia Drew, [name] Orthopedic Surgery [phone] 12/25/2022, 8:59 AM

## 2022-12-25 NOTE — Progress Notes (Signed)
PROGRESS NOTE  Fernando Hess  DOB: 05/27/1946  PCP: Eloisa Northern, MD BWL:[id number]  DOA: 12/24/2022  LOS: 1 day  Hospital Day: 2  Brief narrative: Fernando Hess is a 77 y.o. male with PMH significant for DM2, HTN, HLD, OSA not on CPAP, CAD s/p CABG, PAF on Eliquis, chronic diastolic CHF,CKD stage IIIb, depression/anxiety, cognitive impairment who lives at home with his spouse, normally ambulates with a walker or cane. 4/10, patient was brought to the ED from home after a fall while transferring from chair to walker, impacting right hip leading to immediate pain.  No head injury or loss of consciousness. Patient reports occasional lightheadedness when standing but it did not occur this time. Last dose of Eliquis on the morning of 4/10  In the ED, hemodynamically stable Labs with creatinine 1.88, glucose 259, hemoglobin 13.3 Right hip x-ray showed an acute impacted subcapital right femoral neck fracture. Chest x-ray showed elevated right hemidiaphragm, right basilar atelectasis, prior sternotomy and CABG changes. Patient was given IV pain medicines EDP spoke with on-call orthopedics Dr. Aundria Rud with [facility], tentatively planned for surgical intervention on Friday after Eliquis washout. Admitted to [facility]  Subjective: Patient was seen and examined this morning.  Pleasant elderly male lying down in bed.  Alert, awake.  Per RN at bedside, he was desatting earlier even on minimal movement and she started him on oxygen. Chart reviewed Hemodynamically stable Last set of labs this morning with WBC count 13.6, hemoglobin stable at 14, creatinine worsened to 2.19  Assessment and plan: Acute impacted subcapital right femoral neck fracture Secondary to a mechanical fall  Orthopedic involved.  Tentative plan of surgery on 4/12  Currently on diet.  N.p.o. after midnight ordered  Oral and IV opioids as needed for pain management Chronically on Eliquis.  Currently on hold.  Last dose on the  morning of 4/10. Will resume after surgery  Perioperative medical risk assessment Patient has notable cardiovascular diseases and risk factors for further events.  He reports compliance to his medications and no current cardiac symptoms. We discussed the risk and benefit of proceeding to surgery.  He understands the risk and is agreeable to proceed.  At this time, no further preop study or intervention could be done that could alter his risk.  Chronic diastolic CHF Volume status stable Most recent echo from 2022 with EF 60 to 65%.   PTA, not on beta-blocker due to history of bradycardia.  Not on diuretics/ACE/ARB/spironolactone because of CKD. Per RN this morning, patient had labored breathing on movement and she started him on oxygen.  No crackles or rales heard on auscultation.  Stop IV fluid  Paroxysmal atrial fibrillation Currently in normal sinus rhythm.  PTA not on any AV nodal blocking agent due to history of bradycardia Chronically anticoagulated on Eliquis 5 mg twice daily.  Currently on hold.   CAD s/p CABG HLD  Denies anginal symptoms.  Eliquis plan as above Continue Lipitor   Type 2 diabetes mellitus uncontrolled with hyperglycemia A1c 10.7 in 2024 PTA on Basaglar 40 units morning, Humalog 3 times daily Currently on Semglee 30 units daily and sliding scale insulin Accu-Cheks. Recent Labs  Lab 12/24/22 2311 12/25/22 0623 12/25/22 0849  GLUCAP 338* 319* 430*   AKI on CKD 3B Baseline creatinine 1.9 from January.  Creatinine worsened this morning to 2.19.  Expect improvement.  Continue to monitor.  Recent Labs    08/13/22 0120 09/04/22 1500 09/05/22 0818 09/17/22 1201 09/18/22 0023 09/20/22 0932 10/12/22 0125  10/12/22 0143 12/24/22 1612 12/25/22 0126  BUN 26* 28* 29* 20  19 23 22  30* 35* 19 23  CREATININE 1.76* 2.24* 2.00* 1.99*  1.90* 2.01* 1.80* 1.94* 1.90* 1.88* 2.19*    Depression with anxiety Continue Cymbalta, Bupropion, and  Seroquel.   Mobility: Needs PT eval postprocedure  Goals of care   Code Status: Full Code     DVT prophylaxis: Plan to resume Eliquis after procedure SCDs Start: 12/24/22 1856   Antimicrobials: none Fluid: Await IV hydration.  Encourage oral intake. Consultants: Orthopedics Family Communication: Family not at bedside  Status: Inpatient Level of care:  Med-Surg   Needs to continue in-hospital care:  Pending surgery tomorrow  Patient from: Home Anticipated d/c to: Pending surgery and postop clinical course    Diet:  Diet Order             Diet NPO time specified Except for: Sips with Meds  Diet effective midnight           Diet heart healthy/carb modified Room service appropriate? No; Fluid consistency: Thin  Diet effective now                   Scheduled Meds:  atorvastatin  80 mg Oral QPM   buPROPion  150 mg Oral q morning   DULoxetine  30 mg Oral q morning   DULoxetine  60 mg Oral q morning   insulin aspart  0-15 Units Subcutaneous TID WC   insulin aspart  0-5 Units Subcutaneous QHS   insulin glargine-yfgn  30 Units Subcutaneous Daily   pantoprazole  40 mg Oral q AM   QUEtiapine  50 mg Oral Daily    PRN meds: HYDROcodone-acetaminophen, HYDROmorphone (DILAUDID) injection, senna-docusate   Infusions:   sodium chloride      Antimicrobials: Anti-infectives (From admission, onward)    None       Nutritional status:  Body mass index is 24.21 kg/m.          Objective: Vitals:   12/25/22 0700 12/25/22 0853  BP:  (!) 144/79  Pulse:  98  Resp:  18  Temp:  97.7 F (36.5 C)  SpO2: 92% 94%   No intake or output data in the 24 hours ending 12/25/22 0910 Filed Weights   12/24/22 1444  Weight: 68 kg   Weight change:  Body mass index is 24.21 kg/m.   Physical Exam: General exam: Pleasant, elderly Caucasian male.  Not in distress at the time of my eval Skin: No rashes, lesions or ulcers. HEENT: Atraumatic, normocephalic, no obvious  bleeding Lungs: Clear to auscultation bilaterally,  CVS: regular rate and rhythm, no murmur GI/Abd Soft, nontender, nondistended, bowel sound present CNS: alert, awake, oriented times 3 Psychiatry: Mood appropriate Extremities: No pedal edema, no calf tenderness  Data Review: I have personally reviewed the laboratory data and studies available.  F/u labs ordered Unresulted Labs (From admission, onward)    None       Total time spent in review of labs and imaging, patient evaluation, formulation of plan, documentation and communication with family: 55 minutes  Signed, Lorin Glass, MD [facility] 12/25/2022

## 2022-12-25 NOTE — TOC CAGE-AID Note (Signed)
Transition of Care [facility]) - CAGE-AID Screening   Patient Details  Name: Fernando Hess MRN: [id number] Date of Birth: Nov 10, 1945  [facility] CM/SW Contact:    Katha Hamming, RN Phone Number:[phone] 12/25/2022, 9:07 PM   Clinical Narrative:  No hx of drug/alcohol abuse  CAGE-AID Screening:    Have You Ever Felt You Ought to Cut Down on Your Drinking or Drug Use?: No Have People Annoyed You By Critizing Your Drinking Or Drug Use?: No Have You Felt Bad Or Guilty About Your Drinking Or Drug Use?: No Have You Ever Had a Drink or Used Drugs First Thing In The Morning to Steady Your Nerves or to Get Rid of a Hangover?: No CAGE-AID Score: 0  Substance Abuse Education Offered: No

## 2022-12-25 NOTE — H&P (View-Only) (Signed)
Reason for Consult:Right hip fx Referring Physician: Melina SchoolsBinaya Dahal Time called: [id number] Time at bedside: 0847   Fernando RuffiniLarry W Hess is an 77 y.o. male.  HPI: Fernando NajjarLarry fell at home yesterday during the day. He had immediate pain in his right hip and could not get up. He was brought to the ED where x-rays showed a right hip fx and orthopedic surgery was consulted. He had fallen the day before as well and bruised his thigh pretty good when his RW hit it. He lives at home with his wife.  Past Medical History:  Diagnosis Date   Acute renal failure (ARF) ([name]) 09/24/2015   Acute stress disorder 11/13/2021   Atrial premature complexes    CAD (coronary artery disease) of artery bypass graft    Early occlusion of saphenous vein graft to intermediate and marginal branch in February 2007 following bypass grafting    CAD (coronary artery disease), native coronary artery 2017   hx NSTEMI 09-24-2015  s/p  CABG x5 on 10-02-2015;  post op STEMI inferolateral wall,  SVG OM1 and SVG OM2 occluded, distal OM occlusion the calpruit, treated medically // Myoview 7/21: no ischemia, EF 65, low risk   CHF (congestive heart failure) ([name])    CKD (chronic kidney disease), stage III ([name])    Contusion of right knee 03/16/2020   Dyspnea    Elevated troponin    Erectile dysfunction    Esophageal reflux    History of atrial fibrillation    post op CABG 10-02-2015   History of kidney stones    History of non-ST elevation myocardial infarction (NSTEMI) 09/24/2015   s/p  CABG x5   History of ST elevation myocardial infarction (STEMI) 10/22/2015   inferior wall,  post op CABG 10-02-2015   Hyperlipidemia    Hypertension    Left ureteral stone    Mild atherosclerosis of both carotid arteries    Nephrolithiasis    per CT bilateral non-obstructive calculi   OSA (obstructive sleep apnea)    Peripheral artery disease    LE Arterial US 01/2019: R PTA and ATA occluded; L ATA occluded   RBBB (right bundle branch block)    Renal  atrophy, right    Sleep apnea    wears cpap    ST elevation myocardial infarction (STEMI) of inferior wall ([name]) 10/22/2015   Type 2 diabetes mellitus treated with insulin ([name])    followed by pcp   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) 03/01/2008   Wears glasses     Past Surgical History:  Procedure Laterality Date   APPENDECTOMY  1965   CARDIAC CATHETERIZATION N/A 09/26/2015   Procedure: Left Heart Cath and Coronary Angiography;  Surgeon: Lennette Biharihomas A Kelly, MD;  Location: [facility];  Service: Cardiovascular;  Laterality: N/A;   CARDIAC CATHETERIZATION N/A 10/22/2015   Procedure: Left Heart Cath and Coronary Angiography;  Surgeon: Tonny BollmanMichael Cooper, MD;  [facility];  Service: Cardiovascular;  Laterality: N/A;   CATARACT EXTRACTION W/ INTRAOCULAR LENS  IMPLANT, BILATERAL  2017   COLONOSCOPY     CORONARY ARTERY BYPASS GRAFT N/A 10/02/2015   Procedure: CORONARY ARTERY BYPASS GRAFTING (CABG) X5 LIMA-LAD; SVG-DIAG; SVG-OM; SVG-PD; SVG-RAMUS TRANSESOPHAGEAL ECHOCARDIOGRAM (TEE) ENDOSCOPIC GREATER SAPHENOUS VEIN  HARVEST BILAT LE;  Surgeon: Kerin PernaPeter Van Trigt, MD;  Location: [facility] OR;  Service: Open Heart Surgery;  Laterality: N/A;   CYSTOSCOPY/URETEROSCOPY/HOLMIUM LASER/STENT PLACEMENT Left 08/10/2018   Procedure: CYSTOSCOPY/URETEROSCOPY/HOLMIUM LASER/STENT PLACEMENT;  Surgeon: Jerilee Field, MD;  Location: [facility];  Service: Urology;  Laterality: Left;   CYSTOSCOPY/URETEROSCOPY/HOLMIUM LASER/STENT PLACEMENT Left 09/10/2018   Procedure: CYSTOSCOPY/URETEROSCOPY/HOLMIUM LASER/STENT EXCHANGE;  Surgeon: Jerilee Field, MD;  Location: [facility];  Service: Urology;  Laterality: Left;   LEFT HEART CATHETERIZATION WITH CORONARY ANGIOGRAM N/A 04/13/2014   Procedure: LEFT HEART CATHETERIZATION WITH CORONARY ANGIOGRAM;  Surgeon: Othella Boyer, MD;  Location: [facility] CATH LAB;  Service:  Cardiovascular;  Laterality: N/A;   LEG SURGERY Right age 44   closed reduction leg fracture   NASAL SEPTOPLASTY W/ TURBINOPLASTY Bilateral 08/30/2021   Procedure: NASAL SEPTOPLASTY WITH BILATERAL INFERIOR TURBINATE REDUCTION;  Surgeon: Osborn Coho, MD;  Location: [facility] OR;  Service: ENT;  Laterality: Bilateral;   POLYPECTOMY     RIGHT HEART CATH N/A 06/06/2021   Procedure: RIGHT HEART CATH;  Surgeon: Dolores Patty, MD;  Location: [facility];  Service: Cardiovascular;  Laterality: N/A;   TEE WITHOUT CARDIOVERSION N/A 10/02/2015   Procedure: TRANSESOPHAGEAL ECHOCARDIOGRAM (TEE);  Surgeon: Kerin Perna, MD;  Location: [facility] OR;  Service: Open Heart Surgery;  Laterality: N/A;   URETEROSCOPY WITH HOLMIUM LASER LITHOTRIPSY Bilateral 2004;  2005  dr grapey  @[facility]    VASECTOMY      Family History  Problem Relation Age of Onset   Heart attack Mother    Heart attack Father    Diabetes Brother    Pancreatic cancer Brother    Diabetes Brother    Colon cancer Neg Hx    Esophageal cancer Neg Hx    Prostate cancer Neg Hx    Rectal cancer Neg Hx    Stomach cancer Neg Hx    Colon polyps Neg Hx     Social History:  reports that he has never smoked. He has never used smokeless tobacco. He reports that he does not currently use alcohol. He reports that he does not use drugs.  Allergies:  Allergies  Allergen Reactions   Cilostazol Swelling and Other (See Comments)    Edema    Dulaglutide Nausea And Vomiting and Other (See Comments)    TRULICITY   Levofloxacin Hives, Itching and Rash   Liraglutide Other (See Comments)    Severe fatigue & insomnia   Lisinopril Itching, Rash and Cough    Medications: I have reviewed the patient's current medications.  Results for orders placed or performed during the hospital encounter of 12/24/22 (from the past 48 hour(s))  Basic metabolic panel     Status: Abnormal   Collection Time: 12/24/22  4:12 PM  Result Value Ref Range   Sodium 138 135  - 145 mmol/L   Potassium 4.4 3.5 - 5.1 mmol/L   Chloride 106 98 - 111 mmol/L   CO2 22 22 - 32 mmol/L   Glucose, Bld 259 (H) 70 - 99 mg/dL    Comment: Glucose reference range applies only to samples taken after fasting for at least 8 hours.   BUN 19 8 - 23 mg/dL   Creatinine, Ser 0.34 (H) 0.61 - 1.24 mg/dL   Calcium 8.5 (L) 8.9 - 10.3 mg/dL   GFR, Estimated 36 (L) >60 mL/min    Comment: (NOTE) Calculated using the CKD-EPI Creatinine Equation (2021)    Anion gap 10 5 - 15    Comment: Performed at [facility], [address] [place], [name] [address]  CBC with Differential     Status: Abnormal   Collection Time: 12/24/22  4:12 PM  Result Value  Ref Range   WBC 10.5 4.0 - 10.5 K/uL   RBC 4.56 4.22 - 5.81 MIL/uL   Hemoglobin 13.3 13.0 - 17.0 g/dL   HCT 45.0 38.8 - 82.8 %   MCV 92.8 80.0 - 100.0 fL   MCH 29.2 26.0 - 34.0 pg   MCHC 31.4 30.0 - 36.0 g/dL   RDW 00.3 49.1 - 79.1 %   Platelets 179 150 - 400 K/uL   nRBC 0.0 0.0 - 0.2 %   Neutrophils Relative % 74 %   Neutro Abs 7.8 (H) 1.7 - 7.7 K/uL   Lymphocytes Relative 16 %   Lymphs Abs 1.7 0.7 - 4.0 K/uL   Monocytes Relative 8 %   Monocytes Absolute 0.8 0.1 - 1.0 K/uL   Eosinophils Relative 1 %   Eosinophils Absolute 0.1 0.0 - 0.5 K/uL   Basophils Relative 0 %   Basophils Absolute 0.0 0.0 - 0.1 K/uL   Immature Granulocytes 1 %   Abs Immature Granulocytes 0.08 (H) 0.00 - 0.07 K/uL    Comment: Performed at [facility], [address] [place], [name] [address]  Protime-INR     Status: Abnormal   Collection Time: 12/24/22  4:12 PM  Result Value Ref Range   Prothrombin Time 19.0 (H) 11.4 - 15.2 seconds   INR 1.6 (H) 0.8 - 1.2    Comment: (NOTE) INR goal varies based on device and disease states. Performed at [facility], [address], [place], [name] [address]   Type and screen [facility]     Status: None   Collection Time: 12/24/22  4:14 PM  Result Value Ref Range   ABO/RH(D) O  POS    Antibody Screen NEG    Sample Expiration      12/27/2022,2359 Performed at [facility], [address] [place], [name] [address]   Glucose, capillary     Status: Abnormal   Collection Time: 12/24/22 11:11 PM  Result Value Ref Range   Glucose-Capillary 338 (H) 70 - 99 mg/dL    Comment: Glucose reference range applies only to samples taken after fasting for at least 8 hours.  Basic metabolic panel     Status: Abnormal   Collection Time: 12/25/22  1:26 AM  Result Value Ref Range   Sodium 134 (L) 135 - 145 mmol/L   Potassium 4.6 3.5 - 5.1 mmol/L   Chloride 104 98 - 111 mmol/L   CO2 22 22 - 32 mmol/L   Glucose, Bld 379 (H) 70 - 99 mg/dL    Comment: Glucose reference range applies only to samples taken after fasting for at least 8 hours.   BUN 23 8 - 23 mg/dL   Creatinine, Ser 7.48 (H) 0.61 - 1.24 mg/dL   Calcium 8.5 (L) 8.9 - 10.3 mg/dL   GFR, Estimated 30 (L) >60 mL/min    Comment: (NOTE) Calculated using the CKD-EPI Creatinine Equation (2021)    Anion gap 8 5 - 15    Comment: Performed at [facility], [address] [place], [name] [address]  CBC     Status: Abnormal   Collection Time: 12/25/22  1:26 AM  Result Value Ref Range   WBC 13.6 (H) 4.0 - 10.5 K/uL   RBC 4.81 4.22 - 5.81 MIL/uL   Hemoglobin 14.0 13.0 - 17.0 g/dL   HCT 67.5 44.9 - 20.1 %   MCV 90.4 80.0 - 100.0 fL   MCH 29.1 26.0 - 34.0  pg   MCHC 32.2 30.0 - 36.0 g/dL   RDW 95.614.1 21.311.5 - 08.615.5 %   Platelets 183 150 - 400 K/uL   nRBC 0.0 0.0 - 0.2 %    Comment: Performed at [facility], [address] [place], [place] [id number]  Glucose, capillary     Status: Abnormal   Collection Time: 12/25/22  6:23 AM  Result Value Ref Range   Glucose-Capillary 319 (H) 70 - 99 mg/dL    Comment: Glucose reference range applies only to samples taken after fasting for at least 8 hours.  Glucose, capillary     Status: Abnormal   Collection Time: 12/25/22  8:49 AM  Result Value Ref Range    Glucose-Capillary 430 (H) 70 - 99 mg/dL    Comment: Glucose reference range applies only to samples taken after fasting for at least 8 hours.    DG Chest 1 View  Result Date: 12/24/2022 CLINICAL DATA:  Hip fracture EXAM: CHEST  1 VIEW COMPARISON:  Chest x-ray dated October 12, 2022 FINDINGS: Cardiac and mediastinal contours are unchanged post median sternotomy and CABG. Elevation of the right hemidiaphragm and right basilar atelectasis. No evidence of pleural effusion or pneumothorax. IMPRESSION: Elevation of the right hemidiaphragm and right basilar atelectasis. Electronically Signed   By: Allegra LaiLeah  Strickland M.D.   On: 12/24/2022 17:17   DG Hip Unilat W or Wo Pelvis 2-3 Views Right  Result Date: 12/24/2022 CLINICAL DATA:  Fall EXAM: DG HIP (WITH OR WITHOUT PELVIS) 2-3V RIGHT COMPARISON:  None Available. FINDINGS: There is an acute impacted subcapital right femoral neck fracture. No dislocation. The bones are osteopenic. No significant displacement. IMPRESSION: Acute impacted subcapital right femoral neck fracture. Electronically Signed   By: Darliss CheneyAmy  Guttmann M.D.   On: 12/24/2022 15:57    Review of Systems  HENT:  Negative for ear discharge, ear pain, hearing loss and tinnitus.   Eyes:  Negative for photophobia and pain.  Respiratory:  Negative for cough and shortness of breath.   Cardiovascular:  Negative for chest pain.  Gastrointestinal:  Negative for abdominal pain, nausea and vomiting.  Genitourinary:  Negative for dysuria, flank pain, frequency and urgency.  Musculoskeletal:  Positive for arthralgias (Right hip). Negative for back pain, myalgias and neck pain.  Neurological:  Negative for dizziness and headaches.  Hematological:  Does not bruise/bleed easily.  Psychiatric/Behavioral:  The patient is not nervous/anxious.    Blood pressure (!) 144/79, pulse 98, temperature 97.7 F (36.5 C), temperature source Oral, resp. rate 18, height 5\' 6"  (1.676 m), weight 68 kg, SpO2 94 %. Physical  Exam Constitutional:      General: He is not in acute distress.    Appearance: He is well-developed. He is not diaphoretic.  HENT:     Head: Normocephalic and atraumatic.  Eyes:     General: No scleral icterus.       Right eye: No discharge.        Left eye: No discharge.     Conjunctiva/sclera: Conjunctivae normal.  Cardiovascular:     Rate and Rhythm: Normal rate and regular rhythm.  Pulmonary:     Effort: Pulmonary effort is normal. No respiratory distress.  Musculoskeletal:     Cervical back: Normal range of motion.     Comments: RLE No traumatic wounds or rash, thigh ecchymotic  Mod TTP  No knee or ankle effusion  Knee stable to varus/ valgus and anterior/posterior stress  Sens DPN, SPN, TN intact  Motor EHL, ext,  flex, evers 5/5  DP 2+, PT 2+, No significant edema  Skin:    General: Skin is warm and dry.  Neurological:     Mental Status: He is alert.  Psychiatric:        Mood and Affect: Mood normal.        Behavior: Behavior normal.    Assessment/Plan: Right hip fx -- Plan hip hemi Saturday with Dr. Dion SaucierLandau. Please keep NPO after MN tomorrow. Multiple medical problems including CAD s/p CABG, PAF on Eliquis,, HFpEF, CKD stage IIIb, insulin-dependent T2DM, depression/anxiety, OSA not on CPAP, and cognitive impairment. 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 Mende Biswell, [name] Orthopedic Surgery [phone] 12/25/2022, 8:59 AM

## 2022-12-26 ENCOUNTER — Encounter (HOSPITAL_COMMUNITY): Payer: Self-pay | Admitting: Internal Medicine

## 2022-12-26 ENCOUNTER — Inpatient Hospital Stay (HOSPITAL_COMMUNITY): Payer: Medicare Other | Admitting: Certified Registered Nurse Anesthetist

## 2022-12-26 ENCOUNTER — Other Ambulatory Visit: Payer: Self-pay

## 2022-12-26 ENCOUNTER — Encounter (HOSPITAL_COMMUNITY)
Admission: EM | Disposition: A | Discharge status: Skilled Nursing Facility | Payer: Self-pay | Source: Home / Self Care | Attending: Internal Medicine

## 2022-12-26 ENCOUNTER — Inpatient Hospital Stay (HOSPITAL_COMMUNITY): Payer: Medicare Other

## 2022-12-26 DIAGNOSIS — I509 Heart failure, unspecified: Secondary | ICD-10-CM

## 2022-12-26 DIAGNOSIS — I252 Old myocardial infarction: Secondary | ICD-10-CM | POA: Diagnosis not present

## 2022-12-26 DIAGNOSIS — S72001A Fracture of unspecified part of neck of right femur, initial encounter for closed fracture: Secondary | ICD-10-CM | POA: Diagnosis not present

## 2022-12-26 DIAGNOSIS — I110 Hypertensive heart disease with heart failure: Secondary | ICD-10-CM

## 2022-12-26 DIAGNOSIS — Z951 Presence of aortocoronary bypass graft: Secondary | ICD-10-CM

## 2022-12-26 DIAGNOSIS — I2510 Atherosclerotic heart disease of native coronary artery without angina pectoris: Secondary | ICD-10-CM | POA: Diagnosis not present

## 2022-12-26 HISTORY — PX: HIP ARTHROPLASTY: SHX981

## 2022-12-26 LAB — URINALYSIS, ROUTINE W REFLEX MICROSCOPIC
Bilirubin Urine: NEGATIVE
Glucose, UA: NEGATIVE mg/dL
Ketones, ur: 5 mg/dL — AB
Nitrite: NEGATIVE
Protein, ur: 100 mg/dL — AB
RBC / HPF: >50 RBC/hpf (ref 0–5)
Specific Gravity, Urine: 1.024 (ref 1.005–1.030)
WBC, UA: >50 WBC/hpf (ref 0–5)
pH: 5 (ref 5.0–8.0)

## 2022-12-26 LAB — CBC
HCT: 40.9 % (ref 39.0–52.0)
Hemoglobin: 13.1 g/dL (ref 13.0–17.0)
MCH: 29.2 pg (ref 26.0–34.0)
MCHC: 32 g/dL (ref 30.0–36.0)
MCV: 91.3 fL (ref 80.0–100.0)
Platelets: 158 10*3/uL (ref 150–400)
RBC: 4.48 MIL/uL (ref 4.22–5.81)
RDW: 14.6 % (ref 11.5–15.5)
WBC: 11.8 10*3/uL — ABNORMAL HIGH (ref 4.0–10.5)
nRBC: 0 % (ref 0.0–0.2)

## 2022-12-26 LAB — BASIC METABOLIC PANEL
Anion gap: 11 (ref 5–15)
BUN: 39 mg/dL — ABNORMAL HIGH (ref 8–23)
CO2: 20 mmol/L — ABNORMAL LOW (ref 22–32)
Calcium: 8.3 mg/dL — ABNORMAL LOW (ref 8.9–10.3)
Chloride: 106 mmol/L (ref 98–111)
Creatinine, Ser: 2.61 mg/dL — ABNORMAL HIGH (ref 0.61–1.24)
GFR, Estimated: 24 mL/min — ABNORMAL LOW (ref >60)
Glucose, Bld: 210 mg/dL — ABNORMAL HIGH (ref 70–99)
Potassium: 4.7 mmol/L (ref 3.5–5.1)
Sodium: 137 mmol/L (ref 135–145)

## 2022-12-26 LAB — GLUCOSE, CAPILLARY
Glucose-Capillary: 222 mg/dL — ABNORMAL HIGH (ref 70–99)
Glucose-Capillary: 159 mg/dL — ABNORMAL HIGH (ref 70–99)
Glucose-Capillary: 141 mg/dL — ABNORMAL HIGH (ref 70–99)
Glucose-Capillary: 121 mg/dL — ABNORMAL HIGH (ref 70–99)
Glucose-Capillary: 166 mg/dL — ABNORMAL HIGH (ref 70–99)
Glucose-Capillary: 237 mg/dL — ABNORMAL HIGH (ref 70–99)

## 2022-12-26 LAB — SURGICAL PCR SCREEN
MRSA, PCR: NEGATIVE
Staphylococcus aureus: NEGATIVE

## 2022-12-26 SURGERY — HEMIARTHROPLASTY, HIP, DIRECT ANTERIOR APPROACH, FOR FRACTURE
Anesthesia: General | Site: Hip | Laterality: Right

## 2022-12-26 MED ORDER — SODIUM CHLORIDE 0.9 % IR SOLN
Status: DC | PRN
  Administered 2022-12-26: 3000 mL

## 2022-12-26 MED ORDER — DEXAMETHASONE SODIUM PHOSPHATE 10 MG/ML IJ SOLN
INTRAMUSCULAR | Status: AC
  Filled 2022-12-26: qty 2

## 2022-12-26 MED ORDER — HYDROMORPHONE HCL 1 MG/ML IJ SOLN
INTRAMUSCULAR | Status: AC
  Filled 2022-12-26: qty 1

## 2022-12-26 MED ORDER — GLUCERNA SHAKE PO LIQD
237.0000 mL | Freq: Three times a day (TID) | ORAL | Status: DC
  Administered 2022-12-27 – 2023-01-01 (×11): 237 mL via ORAL

## 2022-12-26 MED ORDER — ONDANSETRON HCL 4 MG/2ML IJ SOLN
INTRAMUSCULAR | Status: AC
  Filled 2022-12-26: qty 4

## 2022-12-26 MED ORDER — CHLORHEXIDINE GLUCONATE CLOTH 2 % EX PADS
6.0000 | MEDICATED_PAD | Freq: Every day | CUTANEOUS | Status: DC
  Administered 2022-12-26 – 2023-01-01 (×7): 6 via TOPICAL

## 2022-12-26 MED ORDER — ONDANSETRON HCL 4 MG/2ML IJ SOLN
INTRAMUSCULAR | Status: DC | PRN
  Administered 2022-12-26: 4 mg via INTRAVENOUS

## 2022-12-26 MED ORDER — 0.9 % SODIUM CHLORIDE (POUR BTL) OPTIME
TOPICAL | Status: DC | PRN
  Administered 2022-12-26: 1000 mL

## 2022-12-26 MED ORDER — ORAL CARE MOUTH RINSE: 15.0000 mL | Freq: Once | OROMUCOSAL | Status: AC

## 2022-12-26 MED ORDER — DEXAMETHASONE SODIUM PHOSPHATE 10 MG/ML IJ SOLN
INTRAMUSCULAR | Status: DC | PRN
  Administered 2022-12-26: 5 mg via INTRAVENOUS

## 2022-12-26 MED ORDER — PHENYLEPHRINE 80 MCG/ML (10ML) SYRINGE FOR IV PUSH (FOR BLOOD PRESSURE SUPPORT)
PREFILLED_SYRINGE | INTRAVENOUS | Status: DC | PRN
  Administered 2022-12-26 (×3): 160 ug via INTRAVENOUS

## 2022-12-26 MED ORDER — LIDOCAINE 2% (20 MG/ML) 5 ML SYRINGE
INTRAMUSCULAR | Status: DC | PRN
  Administered 2022-12-26: 60 mg via INTRAVENOUS

## 2022-12-26 MED ORDER — PROPOFOL 10 MG/ML IV BOLUS
INTRAVENOUS | Status: DC | PRN
  Administered 2022-12-26: 100 mg via INTRAVENOUS

## 2022-12-26 MED ORDER — HYDROMORPHONE HCL 1 MG/ML IJ SOLN
0.2500 mg | INTRAMUSCULAR | Status: DC | PRN
  Administered 2022-12-26: 0.5 mg via INTRAVENOUS

## 2022-12-26 MED ORDER — SUGAMMADEX SODIUM 200 MG/2ML IV SOLN
INTRAVENOUS | Status: DC | PRN
  Administered 2022-12-26: 138 mg via INTRAVENOUS

## 2022-12-26 MED ORDER — OXYCODONE HCL 5 MG PO TABS: 5.0000 mg | ORAL_TABLET | ORAL | 0 refills | Status: DC | PRN

## 2022-12-26 MED ORDER — BUPIVACAINE HCL 0.5 % IJ SOLN
INTRAMUSCULAR | Status: AC
  Filled 2022-12-26: qty 1

## 2022-12-26 MED ORDER — SODIUM CHLORIDE 0.9 % IV SOLN
1.0000 g | INTRAVENOUS | Status: AC
  Administered 2022-12-26 – 2023-01-01 (×7): 1 g via INTRAVENOUS
  Filled 2022-12-26 (×7): qty 10

## 2022-12-26 MED ORDER — OXYCODONE HCL 5 MG PO TABS: 5.0000 mg | ORAL_TABLET | Freq: Once | ORAL | Status: DC | PRN

## 2022-12-26 MED ORDER — APIXABAN 5 MG PO TABS
5.0000 mg | ORAL_TABLET | Freq: Two times a day (BID) | ORAL | Status: DC
  Administered 2022-12-29 – 2023-01-01 (×7): 5 mg via ORAL
  Filled 2022-12-26 (×7): qty 1

## 2022-12-26 MED ORDER — ADULT MULTIVITAMIN W/MINERALS CH
1.0000 | ORAL_TABLET | Freq: Every day | ORAL | Status: DC
  Administered 2022-12-27 – 2023-01-01 (×6): 1 via ORAL
  Filled 2022-12-26 (×6): qty 1

## 2022-12-26 MED ORDER — CEFAZOLIN SODIUM-DEXTROSE 2-4 GM/100ML-% IV SOLN
INTRAVENOUS | Status: AC
  Filled 2022-12-26: qty 100

## 2022-12-26 MED ORDER — TRANEXAMIC ACID-NACL 1000-0.7 MG/100ML-% IV SOLN
1000.0000 mg | INTRAVENOUS | Status: AC
  Administered 2022-12-26: 1000 mg via INTRAVENOUS

## 2022-12-26 MED ORDER — PHENYLEPHRINE HCL-NACL 20-0.9 MG/250ML-% IV SOLN
INTRAVENOUS | Status: DC | PRN
  Administered 2022-12-26: 60 ug/min via INTRAVENOUS

## 2022-12-26 MED ORDER — INSULIN ASPART 100 UNIT/ML IJ SOLN: 0.0000 [IU] | INTRAMUSCULAR | Status: DC | PRN

## 2022-12-26 MED ORDER — LACTATED RINGERS IV SOLN: INTRAVENOUS | Status: DC | PRN

## 2022-12-26 MED ORDER — CHLORHEXIDINE GLUCONATE 0.12 % MT SOLN
OROMUCOSAL | Status: AC
  Administered 2022-12-26: 15 mL via OROMUCOSAL
  Filled 2022-12-26: qty 15

## 2022-12-26 MED ORDER — LIDOCAINE 2% (20 MG/ML) 5 ML SYRINGE
INTRAMUSCULAR | Status: AC
  Filled 2022-12-26: qty 10

## 2022-12-26 MED ORDER — SODIUM CHLORIDE 0.9 % IV SOLN
INTRAVENOUS | Status: DC | PRN
  Administered 2022-12-26: 80 mL

## 2022-12-26 MED ORDER — CHLORHEXIDINE GLUCONATE 4 % EX LIQD: 60.0000 mL | Freq: Once | CUTANEOUS | Status: DC

## 2022-12-26 MED ORDER — BUPIVACAINE LIPOSOME 1.3 % IJ SUSP
INTRAMUSCULAR | Status: AC
  Filled 2022-12-26: qty 20

## 2022-12-26 MED ORDER — ROCURONIUM BROMIDE 10 MG/ML (PF) SYRINGE
PREFILLED_SYRINGE | INTRAVENOUS | Status: DC | PRN
  Administered 2022-12-26: 70 mg via INTRAVENOUS

## 2022-12-26 MED ORDER — CEFAZOLIN SODIUM-DEXTROSE 2-4 GM/100ML-% IV SOLN
2.0000 g | INTRAVENOUS | Status: AC
  Administered 2022-12-26: 2 g via INTRAVENOUS

## 2022-12-26 MED ORDER — ROCURONIUM BROMIDE 10 MG/ML (PF) SYRINGE
PREFILLED_SYRINGE | INTRAVENOUS | Status: AC
  Filled 2022-12-26: qty 20

## 2022-12-26 MED ORDER — CHLORHEXIDINE GLUCONATE 0.12 % MT SOLN: 15.0000 mL | Freq: Once | OROMUCOSAL | Status: AC

## 2022-12-26 MED ORDER — APIXABAN 2.5 MG PO TABS: 2.5000 mg | ORAL_TABLET | Freq: Two times a day (BID) | ORAL | 0 refills | Status: DC

## 2022-12-26 MED ORDER — SODIUM CHLORIDE 0.9 % IV SOLN: INTRAVENOUS | Status: DC

## 2022-12-26 MED ORDER — OXYCODONE HCL 5 MG/5ML PO SOLN: 5.0000 mg | Freq: Once | ORAL | Status: DC | PRN

## 2022-12-26 MED ORDER — TRANEXAMIC ACID-NACL 1000-0.7 MG/100ML-% IV SOLN
INTRAVENOUS | Status: AC
  Filled 2022-12-26: qty 100

## 2022-12-26 MED ORDER — POVIDONE-IODINE 10 % EX SWAB
2.0000 | Freq: Once | CUTANEOUS | Status: AC
  Administered 2022-12-26: 2 via TOPICAL

## 2022-12-26 MED ORDER — PHENYLEPHRINE 80 MCG/ML (10ML) SYRINGE FOR IV PUSH (FOR BLOOD PRESSURE SUPPORT)
PREFILLED_SYRINGE | INTRAVENOUS | Status: AC
  Filled 2022-12-26: qty 10

## 2022-12-26 MED ORDER — PROMETHAZINE HCL 25 MG/ML IJ SOLN: 6.2500 mg | INTRAMUSCULAR | Status: DC | PRN

## 2022-12-26 MED ORDER — PROPOFOL 10 MG/ML IV BOLUS
INTRAVENOUS | Status: AC
  Filled 2022-12-26: qty 20

## 2022-12-26 MED ORDER — APIXABAN 2.5 MG PO TABS
2.5000 mg | ORAL_TABLET | Freq: Two times a day (BID) | ORAL | Status: AC
  Administered 2022-12-27 – 2022-12-28 (×4): 2.5 mg via ORAL
  Filled 2022-12-26 (×4): qty 1

## 2022-12-26 MED ORDER — MUPIROCIN 2 % EX OINT
1.0000 | TOPICAL_OINTMENT | Freq: Two times a day (BID) | CUTANEOUS | Status: AC
  Administered 2022-12-26 – 2022-12-30 (×9): 1 via NASAL
  Filled 2022-12-26 (×2): qty 22

## 2022-12-26 MED ORDER — ACETAMINOPHEN 500 MG PO TABS: 1000.0000 mg | ORAL_TABLET | Freq: Three times a day (TID) | ORAL | 0 refills | Status: AC | PRN

## 2022-12-26 MED ORDER — DULOXETINE HCL 60 MG PO CPEP
90.0000 mg | ORAL_CAPSULE | Freq: Every morning | ORAL | Status: DC
  Administered 2022-12-27 – 2023-01-01 (×6): 90 mg via ORAL
  Filled 2022-12-26 (×6): qty 1

## 2022-12-26 MED ORDER — FENTANYL CITRATE (PF) 250 MCG/5ML IJ SOLN
INTRAMUSCULAR | Status: DC | PRN
  Administered 2022-12-26: 50 ug via INTRAVENOUS

## 2022-12-26 MED ORDER — FENTANYL CITRATE (PF) 250 MCG/5ML IJ SOLN
INTRAMUSCULAR | Status: AC
  Filled 2022-12-26: qty 5

## 2022-12-26 MED ORDER — APIXABAN 5 MG PO TABS
5.0000 mg | ORAL_TABLET | Freq: Two times a day (BID) | ORAL | 0 refills | Status: DC
Start: 2022-12-29 — End: 2023-01-01

## 2022-12-26 MED ORDER — BUPIVACAINE HCL (PF) 0.5 % IJ SOLN
INTRAMUSCULAR | Status: AC
  Filled 2022-12-26: qty 30

## 2022-12-26 SURGICAL SUPPLY — 64 items
ADH SKN CLS APL DERMABOND .7 (GAUZE/BANDAGES/DRESSINGS)
APL PRP STRL LF DISP 70% ISPRP (MISCELLANEOUS) ×1
BLADE SAGITTAL 25.0X1.27X90 (BLADE) ×2 IMPLANT
BRUSH FEMORAL CANAL (MISCELLANEOUS) IMPLANT
CHLORAPREP W/TINT 26 (MISCELLANEOUS) ×8 IMPLANT
COVER SURGICAL LIGHT HANDLE (MISCELLANEOUS) ×4 IMPLANT
DERMABOND ADVANCED .7 DNX12 (GAUZE/BANDAGES/DRESSINGS) ×2 IMPLANT
DRAPE HALF SHEET 40X57 (DRAPES) ×4 IMPLANT
DRAPE HIP W/POCKET STRL (MISCELLANEOUS) ×4 IMPLANT
DRAPE INCISE IOBAN 66X45 STRL (DRAPES) ×2 IMPLANT
DRAPE INCISE IOBAN 85X60 (DRAPES) ×4 IMPLANT
DRAPE POUCH INSTRU U-SHP 10X18 (DRAPES) ×4 IMPLANT
DRAPE U-SHAPE 47X51 STRL (DRAPES) ×8 IMPLANT
DRSG AQUACEL AG ADV 3.5X 6 (GAUZE/BANDAGES/DRESSINGS) IMPLANT
DRSG AQUACEL AG ADV 3.5X10 (GAUZE/BANDAGES/DRESSINGS) ×4 IMPLANT
ELECT BLADE 4.0 EZ CLEAN MEGAD (MISCELLANEOUS) ×1
ELECT REM PT RETURN 15FT ADLT (MISCELLANEOUS) ×2 IMPLANT
ELECTRODE BLDE 4.0 EZ CLN MEGD (MISCELLANEOUS) ×4 IMPLANT
GLOVE BIOGEL PI IND STRL 8 (GLOVE) ×2 IMPLANT
GLOVE SRG 8 PF TXTR STRL LF DI (GLOVE) ×4 IMPLANT
GLOVE SURG ORTHO 8.0 STRL STRW (GLOVE) ×4 IMPLANT
GLOVE SURG ORTHO LTX SZ8 (GLOVE) ×4 IMPLANT
GLOVE SURG UNDER POLY LF SZ8 (GLOVE) ×1
GOWN STRL REUS W/ TWL LRG LVL3 (GOWN DISPOSABLE) ×6 IMPLANT
GOWN STRL REUS W/ TWL XL LVL3 (GOWN DISPOSABLE) ×6 IMPLANT
GOWN STRL REUS W/TWL LRG LVL3 (GOWN DISPOSABLE) ×2
GOWN STRL REUS W/TWL XL LVL3 (GOWN DISPOSABLE) ×1
HANDPIECE INTERPULSE COAX TIP (DISPOSABLE) ×1
HEAD CERAMIC V40 BIOLOX DEL 28 (Orthopedic Implant) IMPLANT
HEAD OSTEO BIPOLAR UNIV 48X28 (Joint) IMPLANT
HOOD PEEL AWAY T7 (MISCELLANEOUS) ×12 IMPLANT
KIT BASIN OR (CUSTOM PROCEDURE TRAY) ×4 IMPLANT
KIT TURNOVER KIT A (KITS) ×4 IMPLANT
MANIFOLD NEPTUNE II (INSTRUMENTS) ×4 IMPLANT
MARKER SKIN DUAL TIP RULER LAB (MISCELLANEOUS) ×4 IMPLANT
NDL 18GX1X1/2 (RX/OR ONLY) (NEEDLE) ×6 IMPLANT
NEEDLE 18GX1X1/2 (RX/OR ONLY) (NEEDLE) ×1 IMPLANT
NS IRRIG 1000ML POUR BTL (IV SOLUTION) ×4 IMPLANT
PACK TOTAL JOINT (CUSTOM PROCEDURE TRAY) ×4 IMPLANT
PRESSURIZER FEMORAL UNIV (MISCELLANEOUS) ×2 IMPLANT
RETRIEVER SUT HEWSON (MISCELLANEOUS) ×4 IMPLANT
SEALER BIPOLAR AQUA 6.0 (INSTRUMENTS) ×4 IMPLANT
SET HNDPC FAN SPRY TIP SCT (DISPOSABLE) ×2 IMPLANT
SET INTERPULSE LAVAGE W/TIP (ORTHOPEDIC DISPOSABLE SUPPLIES) IMPLANT
SOLUTION IRRIG SURGIPHOR (IV SOLUTION) ×2 IMPLANT
SPONGE T-LAP 18X18 ~~LOC~~+RFID (SPONGE) IMPLANT
STEM HIP 4 127DEG (Stem) IMPLANT
SUCTION FRAZIER HANDLE 10FR (MISCELLANEOUS)
SUCTION TUBE FRAZIER 10FR DISP (MISCELLANEOUS) ×4 IMPLANT
SUT BONE WAX W31G (SUTURE) ×4 IMPLANT
SUT ETHIBOND 2 V 37 (SUTURE) ×4 IMPLANT
SUT MNCRL AB 3-0 PS2 18 (SUTURE) ×2 IMPLANT
SUT MNCRL AB 3-0 PS2 27 (SUTURE) ×2 IMPLANT
SUT STRATAFIX 1PDS 45CM VIOLET (SUTURE) ×8 IMPLANT
SUT VIC AB 0 CT1 27 (SUTURE) ×1
SUT VIC AB 0 CT1 27XBRD ANBCTR (SUTURE) ×4 IMPLANT
SUT VIC AB 2-0 CT2 27 (SUTURE) ×8 IMPLANT
SYR 20ML LL LF (SYRINGE) ×4 IMPLANT
SYR 50ML LL SCALE MARK (SYRINGE) ×4 IMPLANT
TOWEL GREEN STERILE (TOWEL DISPOSABLE) ×4 IMPLANT
TOWER CARTRIDGE SMART MIX (DISPOSABLE) IMPLANT
TRAY FOLEY MTR SLVR 16FR STAT (SET/KITS/TRAYS/PACK) ×2 IMPLANT
TUBE SUCT ARGYLE STRL (TUBING) ×4 IMPLANT
WATER STERILE IRR 1000ML POUR (IV SOLUTION) ×4 IMPLANT

## 2022-12-26 NOTE — Discharge Instructions (Signed)
INSTRUCTIONS AFTER HEMIARTHROPLASTY   Remove items at home which could result in a fall. This includes throw rugs or furniture in walking pathways ICE to the affected joint every three hours while awake for 30 minutes at a time, for at least the first 3-5 days, and then as needed for pain and swelling.  Continue to use ice for pain and swelling. You may notice swelling that will progress down to the foot and ankle.  This is normal after surgery.  Elevate your leg when you are not up walking on it.   Continue to use the breathing machine you got in the hospital (incentive spirometer) which will help keep your temperature down.  It is common for your temperature to cycle up and down following surgery, especially at night when you are not up moving around and exerting yourself.  The breathing machine keeps your lungs expanded and your temperature down.   DIET:  As you were doing prior to hospitalization, we recommend a well-balanced diet.  DRESSING / WOUND CARE / SHOWERING  Keep the surgical dressing until follow up.  The dressing is water proof, so you can shower without any extra covering.  IF THE DRESSING FALLS OFF or the wound gets wet inside, change the dressing with sterile gauze.  Please use good hand washing techniques before changing the dressing.  Do not use any lotions or creams on the incision until instructed by your surgeon.    ACTIVITY  Increase activity slowly as tolerated, but follow the weight bearing instructions below.   No driving for 6 weeks or until further direction given by your physician.  You cannot drive while taking narcotics.  No lifting or carrying greater than 10 lbs. until further directed by your surgeon. Avoid periods of inactivity such as sitting longer than an hour when not asleep. This helps prevent blood clots.  You may return to work once you are authorized by your doctor.     WEIGHT BEARING   Weight bearing as tolerated with assist device (walker, cane,  etc) as directed, use it as long as suggested by your surgeon or therapist, typically at least 4-6 weeks.   EXERCISES  Results after hemiarthroplasty surgery are often greatly improved when you follow the exercise, range of motion and muscle strengthening exercises prescribed by your doctor. Safety measures are also important to protect the joint from further injury. Any time any of these exercises cause you to have increased pain or swelling, decrease what you are doing until you are comfortable again and then slowly increase them. If you have problems or questions, call your caregiver or physical therapist for advice.   Rehabilitation is important following this surgery. After just a few days of immobilization, the muscles of the leg can become weakened and shrink (atrophy).  These exercises are designed to build up the tone and strength of the thigh and leg muscles and to improve motion. Often times heat used for twenty to thirty minutes before working out will loosen up your tissues and help with improving the range of motion but do not use heat for the first two weeks following surgery (sometimes heat can increase post-operative swelling).   These exercises can be done on a training (exercise) mat, on the floor, on a table or on a bed. Use whatever works the best and is most comfortable for you.    Use music or television while you are exercising so that the exercises are a pleasant break in your day. This will  make your life better with the exercises acting as a break in your routine that you can look forward to.   Perform all exercises about fifteen times, three times per day or as directed.  You should exercise both the operative leg and the other leg as well.  Exercises include:   Quad Sets - Tighten up the muscle on the front of the thigh (Quad) and hold for 5-10 seconds.   Straight Leg Raises - With your knee straight (if you were given a brace, keep it on), lift the leg to 60 degrees, hold  for 3 seconds, and slowly lower the leg.  Perform this exercise against resistance later as your leg gets stronger.  Leg Slides: Lying on your back, slowly slide your foot toward your buttocks, bending your knee up off the floor (only go as far as is comfortable). Then slowly slide your foot back down until your leg is flat on the floor again.  Angel Wings: Lying on your back spread your legs to the side as far apart as you can without causing discomfort.  Hamstring Strength:  Lying on your back, push your heel against the floor with your leg straight by tightening up the muscles of your buttocks.  Repeat, but this time bend your knee to a comfortable angle, and push your heel against the floor.  You may put a pillow under the heel to make it more comfortable if necessary.   A rehabilitation program following hip hemiarthroplasty surgery can speed recovery and prevent re-injury in the future due to weakened muscles. Contact your doctor or a physical therapist for more information on knee rehabilitation.    CONSTIPATION  Constipation is defined medically as fewer than three stools per week and severe constipation as less than one stool per week.  Even if you have a regular bowel pattern at home, your normal regimen is likely to be disrupted due to multiple reasons following surgery.  Combination of anesthesia, postoperative narcotics, change in appetite and fluid intake all can affect your bowels.   YOU MUST use at least one of the following options; they are listed in order of increasing strength to get the job done.  They are all available over the counter, and you may need to use some, POSSIBLY even all of these options:    Drink plenty of fluids (prune juice may be helpful) and high fiber foods Colace 100 mg by mouth twice a day  Senokot for constipation as directed and as needed Dulcolax (bisacodyl), take with full glass of water  Miralax (polyethylene glycol) once or twice a day as  needed.  If you have tried all these things and are unable to have a bowel movement in the first 3-4 days after surgery call either your surgeon or your primary doctor.    If you experience loose stools or diarrhea, hold the medications until you stool forms back up.  If your symptoms do not get better within 1 week or if they get worse, check with your doctor.  If you experience "the worst abdominal pain ever" or develop nausea or vomiting, please contact the office immediately for further recommendations for treatment.   ITCHING:  If you experience itching with your medications, try taking only a single pain pill, or even half a pain pill at a time.  You can also use Benadryl over the counter for itching or also to help with sleep.   TED HOSE STOCKINGS:  Use stockings on both legs until for  at least 2 weeks or as directed by physician office. They may be removed at night for sleeping.  MEDICATIONS:  See your medication summary on the "After Visit Summary" that nursing will review with you.  You may have some home medications which will be placed on hold until you complete the course of blood thinner medication.  It is important for you to complete the blood thinner medication as prescribed.   Blood clot prevention (DVT Prophylaxis): After surgery you are at an increased risk for a blood clot. you were prescribed a blood thinner, Eliquis, to be taken twice daily for a total of 4 weeks from surgery to help reduce your risk of getting a blood clot. This will help prevent a blood clot. Signs of a pulmonary embolus (blood clot in the lungs) include sudden short of breath, feeling lightheaded or dizzy, chest pain with a deep breath, rapid pulse rapid breathing. Signs of a blood clot in your arms or legs include new unexplained swelling and cramping, warm, red or darkened skin around the painful area. Please call the office or 911 right away if these signs or symptoms develop.  PRECAUTIONS:  If you  experience chest pain or shortness of breath - call 911 immediately for transfer to the hospital emergency department.   If you develop a fever greater that 101 F, purulent drainage from wound, increased redness or drainage from wound, foul odor from the wound/dressing, or calf pain - CONTACT YOUR SURGEON.                                                   FOLLOW-UP APPOINTMENTS:  If you do not already have a post-op appointment, please call the office for an appointment to be seen by your surgeon.  Guidelines for how soon to be seen are listed in your "After Visit Summary", but are typically between 2-3 weeks after surgery.   POST-OPERATIVE OPIOID TAPER INSTRUCTIONS: It is important to wean off of your opioid medication as soon as possible. If you do not need pain medication after your surgery it is ok to stop day one. Opioids include: Codeine, Hydrocodone(Norco, Vicodin), Oxycodone(Percocet, oxycontin) and hydromorphone amongst others.  Long term and even short term use of opiods can cause: Increased pain response Dependence Constipation Depression Respiratory depression And more.  Withdrawal symptoms can include Flu like symptoms Nausea, vomiting And more Techniques to manage these symptoms Hydrate well Eat regular healthy meals Stay active Use relaxation techniques(deep breathing, meditating, yoga) Do Not substitute Alcohol to help with tapering If you have been on opioids for less than two weeks and do not have pain than it is ok to stop all together.  Plan to wean off of opioids This plan should start within one week post op of your joint replacement. Maintain the same interval or time between taking each dose and first decrease the dose.  Cut the total daily intake of opioids by one tablet each day Next start to increase the time between doses. The last dose that should be eliminated is the evening dose.   MAKE SURE YOU:  Understand these instructions.  Get help right away  if you are not doing well or get worse.    Thank you for letting us be a part of your medical care team.  It is a privilege we respect greatly.  We hope these instructions will help you stay on track for a fast and full recovery!

## 2022-12-26 NOTE — Anesthesia Preprocedure Evaluation (Signed)
Anesthesia Evaluation  Patient identified by MRN, date of birth, ID band Patient awake    Reviewed: Allergy & Precautions, NPO status , Patient's Chart, lab work & pertinent test results, reviewed documented beta blocker date and time   Airway Mallampati: II  TM Distance: >3 FB Neck ROM: Full    Dental no notable dental hx. (+) Teeth Intact, Caps, Dental Advisory Given   Pulmonary shortness of breath and with exertion, sleep apnea and Continuous Positive Airway Pressure Ventilation  Nasal septal deviation Inferior turbinate hypertrophy   Pulmonary exam normal breath sounds clear to auscultation       Cardiovascular hypertension, Pt. on medications + CAD, + Past MI, + CABG, + Peripheral Vascular Disease and +CHF  + dysrhythmias + Valvular Problems/Murmurs AS  Rhythm:Irregular Rate:Normal  hx NSTEMI 09-24-2015  s/p  CABG x5 on 10-02-2015;  post op STEMI inferolateral wall,  SVG OM1 and SVG OM2 occluded, distal OM occlusion the calpruit, treated medically // Myoview 7/21: no ischemia, EF 65, low risk  Echo 05/10/21 1. Left ventricular ejection fraction, by estimation, is 60 to 65%. The left ventricle has normal function. Left ventricular endocardial border not optimally defined to evaluate regional wall motion. There is mild left ventricular hypertrophy. Left ventricular diastolic parameters are consistent with Grade I diastolic dysfunction (impaired relaxation).  2. Right ventricular systolic function is mildly reduced. The right ventricular size is mildly enlarged. Tricuspid regurgitation signal is inadequate for assessing PA pressure.  3. Left atrial size was mildly dilated.  4. The mitral valve is degenerative. Trivial mitral valve regurgitation. No evidence of mitral stenosis.  5. The aortic valve is abnormal. There is moderate calcification of the aortic valve. There is moderate thickening of the aortic valve. Aortic valve  regurgitation is trivial. Mild aortic valve stenosis. Aortic valve area, by VTI measures 1.81 cm. Aortic valve mean gradient measures 11.7 mmHg. Aortic valve Vmax measures 2.50 m/s.  6. The inferior vena cava is normal in size with greater than 50% respiratory variability, suggesting right atrial pressure of 3 mmHg.  EKG 06/26/21 NSR with sinus arrythymia. RBBB pattern   Neuro/Psych  Headaches  negative psych ROS   GI/Hepatic Neg liver ROS,GERD  Medicated and Controlled,,  Endo/Other  diabetes, Poorly Controlled, Type 2, Insulin Dependent  Hyperlipidemia  Renal/GU Renal InsufficiencyRenal disease  negative genitourinary   Musculoskeletal negative musculoskeletal ROS (+)    Abdominal  (+) + obese  Peds  Hematology Eliquis therapy- last dose 5 days ago   Anesthesia Other Findings   Reproductive/Obstetrics                             Anesthesia Physical Anesthesia Plan  ASA: 3  Anesthesia Plan: General   Post-op Pain Management: Dilaudid IV and Tylenol PO (pre-op)*   Induction: Intravenous  PONV Risk Score and Plan: 2 and Treatment may vary due to age or medical condition, Ondansetron and Midazolam  Airway Management Planned: Oral ETT  Additional Equipment:   Intra-op Plan:   Post-operative Plan: Extubation in OR  Informed Consent: I have reviewed the patients History and Physical, chart, labs and discussed the procedure including the risks, benefits and alternatives for the proposed anesthesia with the patient or authorized representative who has indicated his/her understanding and acceptance.     Dental advisory given  Plan Discussed with: CRNA and Anesthesiologist  Anesthesia Plan Comments: (PAT note by Fernando Poles, [name]: Follows with cardiology for history of CAD s/p  CABG 2017, HFpEF, paroxysmal A. fib on Eliquis, mild AS, HTN, HLD, PAD.  Cardiac clearance per telephone encounter 08/20/21, "Chart reviewed as part of pre-operative  protocol coverage. Patient was contacted12/6/2022in reference to pre-operative risk assessment for pending surgery as outlined below. Fernando Reust Canterwas last seen on11/22/22by Dr. Shari Hess. Since that day, [name] done well.Due to symptoms, nuclear stress test was ordered and showed no evidence of ischemia. Per our clinical pharmacist: Per office protocol, patient can holdEliquisfor 2-3days prior to procedure. Therefore, based on ACC/AHA guidelines, the patient would be at acceptable risk for the planned procedure without further cardiovascular testing."  LD Eliquis 08/26/21.  OSA on CPAP.  CKD 3.  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 PAT RN called this result to surgeon's office.   Preop labs reviewed, creatinine elevated 1.91 c/w history of CKD 3, otherwise unremarkable.  EKG 06/26/21: Sinus rhythm with respiratory sinus arrhythmia. Rate 87. PACs. RBBB.  CHEST - 2 VIEW 07/05/2021: COMPARISON: 05/10/2021  FINDINGS: Cardiomediastinal silhouette unchanged in size and contour. No evidence of central vascular congestion. No interlobular septal thickening.  Surgical changes of median sternotomy and CABG  No pneumothorax or pleural effusion. Coarsened interstitial markings, with no confluent airspace disease.  No acute displaced fracture. Degenerative changes of the spine.  IMPRESSION: Negative for acute cardiopulmonary disease  Nuclear stress 08/16/21: . The patient reported no symptoms during the stress test. . ECG rhythm shows normal sinus rhythm. ECG demonstrates incomplete right bundle branch block. The ECG shows premature atrial contractions. . Arrhythmias during stress: occasional PACs. Arrhythmias during recovery: occasional PACs. ECG was interpretable and without significant changes. The ECG was not diagnostic due to pharmacologic protocol. . Overall image quality is good. Diaphragmatic attenuation artifact was present. Image quality  affected due to significant extracardiac activity. . LV perfusion is normal. There is no evidence of ischemia. There is no evidence of infarction. . Nuclear stress EF: 55 %. The left ventricular ejection fraction is normal (55-65%). No evidence of transient ischemic dilation (TID) noted. . Prior study available for comparison from 03/20/2020. No change from prior study.  Event monitor 06/07/21: . Patient had a minimum heart rate of 43 bpm, maximum heart rate of 171 bpm (during SVT), and average heart rate of 76 bpm. . Predominant underlying rhythm was sinus rhythm. . Frequent short runs of supraventricular tachycardia occurred lasting 45 seconds at longest with a max rate of 171 bpm at fastest (most much slower). . Isolated PACs were frequent (7.3%). . Isolated PVCs were rare (<1.0%). . No triggered and diary events.  Frequent, asymptomatic PACs and short runs of SVT.  RHC 06/06/21: Assessment: 1. Low filling pressures with moderately reduce CO  Plan/Discussion:  Continue medical therapy   TTE 05/10/21: 1. Left ventricular ejection fraction, by estimation, is 60 to 65%. The  left ventricle has normal function. Left ventricular endocardial border  not optimally defined to evaluate regional wall motion. There is mild left  ventricular hypertrophy. Left  ventricular diastolic parameters are consistent with Grade I diastolic  dysfunction (impaired relaxation).  2. Right ventricular systolic function is mildly reduced. The right  ventricular size is mildly enlarged. Tricuspid regurgitation signal is  inadequate for assessing PA pressure.  3. Left atrial size was mildly dilated.  4. The mitral valve is degenerative. Trivial mitral valve regurgitation.  No evidence of mitral stenosis.  5. The aortic valve is abnormal. There is moderate calcification of the  aortic valve. There is moderate thickening of the aortic valve.  Aortic  valve regurgitation is trivial. Mild aortic valve  stenosis. Aortic valve  area, by VTI measures 1.81 cm.  Aortic valve mean gradient measures 11.7 mmHg. Aortic valve Vmax measures  2.50 m/s.  6. The inferior vena cava is normal in size with greater than 50%  respiratory variability, suggesting right atrial pressure of 3 mmHg.   )        Anesthesia Quick Evaluation

## 2022-12-26 NOTE — Transfer of Care (Signed)
Immediate Anesthesia Transfer of Care Note  Patient: Fernando Hess  Procedure(s) Performed: RIGHT HEMI HIP ARTHROPLASTY (Right: Hip)  Patient Location: PACU  Anesthesia Type:General  Level of Consciousness: awake  Airway & Oxygen Therapy: Patient Spontanous Breathing and Patient connected to face mask oxygen  Post-op Assessment: Report given to RN and Post -op Vital signs reviewed and stable  Post vital signs: Reviewed and stable  Last Vitals:  Vitals Value Taken Time  BP 121/65 12/26/22 1517  Temp    Pulse 95 12/26/22 1520  Resp 19 12/26/22 1520  SpO2 93 % 12/26/22 1520  Vitals shown include unvalidated device data.  Last Pain:  Vitals:   12/26/22 1137  TempSrc:   PainSc: 8          Complications: No notable events documented.

## 2022-12-26 NOTE — Progress Notes (Signed)
    Patient Name: Fernando Hess           DOB: 1946-05-10  MRN: [id number]      Admission Date: 12/24/2022  Attending Provider: Lorin Glass, MD  Primary Diagnosis: Fracture of femoral neck, right, closed   Level of care: Med-Surg    CROSS COVER NOTE   Date of Service   12/26/2022   Fernando Hess, 77 y.o. male, was admitted on 12/24/2022 for Fracture of femoral neck, right, closed.    HPI/Events of Note   Nursing staff reports patient experiencing burning sensation when voiding.  Required Foley catheter placement during daytime for retention.  Order placed for urinalysis.   UA collected shows moderate leukocytes with WBC> 50.  Also positive for hemoglobin, ketones, protein.   Last serum WBC 10.5--> 13.6, afebrile. Will start Rocephin for UTI   Interventions/ Plan   Above        Fernando Harada, DNP, [facility] [place]

## 2022-12-26 NOTE — Progress Notes (Signed)
PROGRESS NOTE  Fernando Hess  DOB: 16-Jul-1946  PCP: [name], MD ZHY:[id number]  DOA: 12/24/2022  LOS: 2 days  Hospital Day: 3  Brief narrative: Fernando Hess is a 77 y.o. male with PMH significant for DM2, HTN, HLD, OSA not on CPAP, CAD s/p CABG, PAF on Eliquis, chronic diastolic CHF,CKD stage IIIb, depression/anxiety, cognitive impairment who lives at home with his spouse, normally ambulates with a walker or cane. 4/10, patient was brought to the ED from home after a fall while transferring from chair to walker, impacting right hip leading to immediate pain.  No head injury or loss of consciousness. Patient reports occasional lightheadedness when standing but it did not occur this time. Last dose of Eliquis on the morning of 4/10  In the ED, hemodynamically stable Labs with creatinine 1.88, glucose 259, hemoglobin 13.3 Right hip x-ray showed an acute impacted subcapital right femoral neck fracture. Chest x-ray showed elevated right hemidiaphragm, right basilar atelectasis, prior sternotomy and CABG changes. Patient was given IV pain medicines EDP spoke with on-call orthopedics Dr. Aundria Rud with [facility], tentatively planned for surgical intervention on Friday after Eliquis washout. Admitted to [facility]  Subjective: Patient was seen and examined this morning prior to surgery. Not in distress.  No new symptoms.  On 3 L of milligrams. Labs from this morning with creatinine worsening to 2.61  Assessment and plan: Acute impacted subcapital right femoral neck fracture Secondary to a mechanical fall  Oral and IV opioids as needed for pain management Chronically on Eliquis. Currently on hold.  Last dose on the morning of 4/10. Will resume once cleared by orthopedics.  Acute respiratory failure with hypoxia 4/11, patient was dyspneic and required supplemental oxygen. X-ray with hyperinflation of both lungs and mild bibasilar atelectasis. Patient was started on 3 L oxygen by  nasal cannula.  AKI on CKD 3B Baseline creatinine 1.9 from January.  4/11, creatinine elevated to 2.19.  IV hydration could not be given because of dyspnea.  Creatinine has further worsened to 2.61 today.  Postprocedure, will plan to start him on IV fluid Recent Labs    09/04/22 1500 09/05/22 0818 09/17/22 1201 09/18/22 0023 09/20/22 0932 10/12/22 0125 10/12/22 0143 12/24/22 1612 12/25/22 0126 12/26/22 0847  BUN 28* 29* 30* 35* 19 23 39*  CREATININE 2.24* 2.00* 1.99*  1.90* 2.01* 1.80* 1.94* 1.90* 1.88* 2.19* 2.61*   Chronic diastolic CHF Volume status stable Most recent echo from 2022 with EF 60 to 65%.   PTA, not on beta-blocker due to history of bradycardia.  Not on diuretics/ACE/ARB/spironolactone because of CKD.  Paroxysmal atrial fibrillation Currently in normal sinus rhythm.  PTA not on any AV nodal blocking agent due to history of bradycardia Chronically anticoagulated on Eliquis 5 mg twice daily.  Currently on hold.   CAD s/p CABG HLD  Denies anginal symptoms.  Eliquis plan as above Continue Lipitor   Type 2 diabetes mellitus uncontrolled with hyperglycemia A1c 10.7 in 2024 PTA on Basaglar 40 units morning, Humalog 3 times daily Currently on Semglee 30 units daily and sliding scale insulin Accu-Cheks. Recent Labs  Lab 12/25/22 1620 12/25/22 2133 12/26/22 0733 12/26/22 1121 12/26/22 1324  GLUCAP 109* 134* 222* 159* 141*     Depression with anxiety Continue Cymbalta, Bupropion, and Seroquel.   Mobility: Needs PT eval postprocedure  Goals of care   Code Status: Full Code     DVT prophylaxis: Plan to resume Eliquis after procedure SCDs Start: 12/24/22 1856  Antimicrobials: none Fluid: Not currently Consultants: Orthopedics Family Communication: Family not at bedside  Status: Inpatient Level of care:  Med-Surg   Needs to continue in-hospital care:  Surgery today.  Patient from: Home Anticipated d/c to: Pending surgery and  postop clinical course    Diet:  Diet Order             Diet NPO time specified  Diet effective midnight                   Scheduled Meds:  [MAR Hold] atorvastatin  80 mg Oral QPM   [MAR Hold] buPROPion  150 mg Oral q morning   chlorhexidine  60 mL Topical Once   [MAR Hold] Chlorhexidine Gluconate Cloth  6 each Topical Daily   [MAR Hold] DULoxetine  90 mg Oral q morning   [MAR Hold] feeding supplement (GLUCERNA SHAKE)  237 mL Oral TID BM   [MAR Hold] insulin aspart  0-15 Units Subcutaneous TID WC   [MAR Hold] insulin aspart  0-5 Units Subcutaneous QHS   [MAR Hold] insulin glargine-yfgn  30 Units Subcutaneous Daily   [MAR Hold] multivitamin with minerals  1 tablet Oral Daily   [MAR Hold] mupirocin ointment  1 Application Nasal BID   [MAR Hold] pantoprazole  40 mg Oral q AM   [MAR Hold] QUEtiapine  50 mg Oral Daily    PRN meds: 0.9 % irrigation (POUR BTL), bupivacaine liposome (EXPAREL 1.3%) in normal saline Optime, ceFAZolin, [MAR Hold] HYDROcodone-acetaminophen, [MAR Hold]  HYDROmorphone (DILAUDID) injection, insulin aspart, [MAR Hold] ipratropium, [MAR Hold] senna-docusate, tranexamic acid   Infusions:   sodium chloride     sodium chloride 10 mL/hr at 12/26/22 1141   ceFAZolin     [START ON 12/27/2022]  ceFAZolin (ANCEF) IV     [MAR Hold] cefTRIAXone (ROCEPHIN)  IV 1 g (12/26/22 0651)   tranexamic acid     [START ON 12/27/2022] tranexamic acid      Antimicrobials: Anti-infectives (From admission, onward)    Start     Dose/Rate Route Frequency Ordered Stop   12/27/22 0800  ceFAZolin (ANCEF) IVPB 2g/100 mL premix        2 g 200 mL/hr over 30 Minutes Intravenous To ShortStay Surgical 12/26/22 1059 12/28/22 0800   12/26/22 1107  ceFAZolin (ANCEF) 2-4 GM/100ML-% IVPB       Note to Pharmacy: [facility], [place]: cabinet override      12/26/22 1107 12/26/22 2314   12/26/22 0600  [MAR Hold]  cefTRIAXone (ROCEPHIN) 1 g in sodium chloride 0.9 % 100 mL IVPB        (MAR Hold  since Fri 12/26/2022 at 1117.Hold Reason: Transfer to a Procedural area)   1 g 200 mL/hr over 30 Minutes Intravenous Every 24 hours 12/26/22 0520         Nutritional status:  Body mass index is 24.55 kg/m.          Objective: Vitals:   12/26/22 1117 12/26/22 1137  BP: 130/88   Pulse: (!) 104   Resp: 18   Temp: 99.1 F (37.3 C)   SpO2: 93% 94%    Intake/Output Summary (Last 24 hours) at 12/26/2022 1335 Last data filed at 12/26/2022 1148 Gross per 24 hour  Intake --  Output 800 ml  Net -800 ml   Filed Weights   12/24/22 1444 12/26/22 1117  Weight: 68 kg 69 kg   Weight change:  Body mass index is 24.55 kg/m.   Physical Exam: General exam: Pleasant,  elderly Caucasian male.  Not in distress. Skin: No rashes, lesions or ulcers. HEENT: Atraumatic, normocephalic, no obvious bleeding Lungs: Clear to auscultation bilaterally,  CVS: regular rate and rhythm, no murmur GI/Abd Soft, nontender, nondistended, bowel sound present CNS: alert, awake, oriented times 3 Psychiatry: Mood appropriate Extremities: No pedal edema, no calf tenderness  Data Review: I have personally reviewed the laboratory data and studies available.  F/u labs ordered Unresulted Labs (From admission, onward)     Start     Ordered   12/26/22 0956  Urinalysis, Routine w reflex microscopic -Urine, Clean Catch  Add-on,   AD       Question:  Specimen Source  Answer:  Urine, Clean Catch   12/26/22 0956            Total time spent in review of labs and imaging, patient evaluation, formulation of plan, documentation and communication with family: 45 minutes  Signed, Lorin Glass, MD [facility] 12/26/2022

## 2022-12-26 NOTE — Op Note (Signed)
12/26/2022  5:19 PM  PATIENT:  Fernando Hess   MRN: [id number]  PRE-OPERATIVE DIAGNOSIS: Right displaced femoral neck fracture  POST-OPERATIVE DIAGNOSIS: Same  PROCEDURE:  Procedure(s): Right hip hemiarthroplasty  PREOPERATIVE INDICATIONS:  Fernando Hess is an 77 y.o. male who was admitted 12/24/2022 with a diagnosis of Fracture of femoral neck, right, closed and elected for surgical management.  The risks benefits and alternatives were discussed with the patient including but not limited to the risks of nonoperative treatment, versus surgical intervention including infection, bleeding, nerve injury, periprosthetic fracture, the need for revision surgery, dislocation, leg length discrepancy, blood clots, cardiopulmonary complications, morbidity, mortality, among others, and they were willing to proceed.  Predicted outcome is good, although there will be at least a six to nine month expected recovery.   OPERATIVE REPORT     SURGEON:  Weber Cooks, MD    ASSISTANT: Darron Doom, RNFA (Present throughout the entire procedure,  necessary for completion of procedure in a timely manner, assisting with retraction, instrumentation, and closure)     ANESTHESIA: General  ESTIMATED BLOOD LOSS: 200cc    COMPLICATIONS:  None.      COMPONENTS:  [employer] Accolade 2 size 4 stem with 1 two 7 degree neck angle, 28+0 ceramic head, 28 x 48 bipolar Hemi head Implant Name Type Inv. Item Serial No. Manufacturer Lot No. LRB No. Used Action  STEM HIP 4 127DEG - [id number] Stem STEM HIP 4 127DEG  [employer] [id number] A Right 1 Implanted  HEAD CERAMIC V40 BIOLOX DEL 28 - [id number] Orthopedic Implant HEAD CERAMIC V40 BIOLOX DEL 28  [employer] [id number] Right 1 Implanted  UHR UNIVERSAL HEAD BIPOLAR COMPONENT Hips   [employer] [id number] Right 1 Implanted      PROCEDURE IN DETAIL: The patient was met in the holding area and identified.  The appropriate hip  was marked at the operative  site. The patient was then transported to the OR and  placed under anesthesia.  At that point, the patient was  placed in the lateral decubitus position with the operative side up and  secured to the operating room table and all bony prominences padded. A subaxillary role was placed.    The operative lower extremity was prepped from the iliac crest to the ankle.  Sterile draping was performed.  2g of ancef  and 1g TXA  were given prior to incision. Time out was performed prior to incision.      A routine posterolateral approach was utilized via sharp dissection  carried down to the subcutaneous tissue.  Gross bleeders were Bovie  coagulated.  The iliotibial band was identified and incised  along the length of the skin incision.  A Charnley retractor was inserted with care to protect the sciatic nerve.  With the hip internally rotated, the short external rotators  were identified. The piriformis was tagged with #2 Ethibond, and the hip capsule released in a T-type fashion, and posterior sleeve of the capsule was also tagged.  The femoral neck was exposed, and I resected the femoral neck using the appropriate jig. This was performed at approximately a thumb's breadth above the lesser trochanter.    I then exposed the deep acetabulum, cleared out any tissue including the ligamentum teres.    I then prepared the proximal femur using the box cutter, Charnley awl, and then sequentially broached.  A trial utilized, and I reduced the hip, leg lengths were assessed clinically and felt to be equal. The hip was then taken  through a full range of motion, the hip was stable at full extension and 90 degrees external rotation without anterior subluxation. The hip was also stable in the position of sleep, and in neutral abduction up to 90 degrees flexion, and 90 degrees IR. The trial components were then removed.   The real size 4 stem was then opened and impacted in place.  Had excellent axial rotational  stability.  The hip was then reduced with the trial head again and taken through functional range of motion and found to have excellent stability. Leg lengths were restored. The real head was then impacted onto the stem and the hip was again reduced.  The capsule was then repaired with #2 Ethibond., and the piriformis was repaired to the abductor tendon. Excellent posterior capsular repair was achieved.   I then irrigated the hip copiously again with pulse lavage. The wounds were injected with 20cc exparal diluted in sterile saline. The fascia and IT band was repaired with #1 stratafix, followed by 0 stratafix for the fat layer followed by 2-0 Vicryl and running 3-0 Monocryl for the skin, Dermabond was applied and an Aquacel dressing was placed.  The patient was then awakened and returned to PACU in stable and satisfactory condition. There were no complications.  Post op recs: WB: WBAT, no formal hip precautions Abx: Ancef given preincision, patient will continue on scheduled ceftriaxone for treatment of his urinary tract infection Imaging: PACU xrays Dressing: Aquacel dressing to be kept intact until follow-up DVT prophylaxis: Resume Eliquis 2.5 twice daily postop day 1 and postop day 2, resume Eliquis 5 mg twice daily starting postop day 3 Follow up: 2 weeks after surgery for a wound check with Dr. Blanchie Dessert at [facility].  Address: [address], [place], [name] [address]  Office Phone: [phone]   Weber Cooks, MD Orthopedic Surgeon  12/26/2022 5:19 PM

## 2022-12-26 NOTE — Plan of Care (Signed)

## 2022-12-26 NOTE — Interval H&P Note (Signed)
The patient has been re-examined, and the chart reviewed, and there have been no interval changes to the documented history and physical.    Plan for R hip hemiarthroplasty  The operative side was examined and the patient was confirmed to have sensation to DPN, SPN, TN intact, Motor EHL, ext, flex 5/5, and DP 2+, PT 2+, No significant edema.   The risks, benefits, and alternatives have been discussed at length with patient, and the patient is willing to proceed.  Right hip marked. Consent has been signed.

## 2022-12-26 NOTE — Plan of Care (Signed)
  Problem: Coping: Goal: Ability to adjust to condition or change in health will improve Outcome: Progressing   Problem: Skin Integrity: Goal: Risk for impaired skin integrity will decrease Outcome: Progressing   Problem: Health Behavior/Discharge Planning: Goal: Ability to manage health-related needs will improve Outcome: Progressing   Problem: Clinical Measurements: Goal: Ability to maintain clinical measurements within normal limits will improve Outcome: Progressing Goal: Will remain free from infection Outcome: Progressing

## 2022-12-26 NOTE — Anesthesia Procedure Notes (Signed)
Procedure Name: Intubation Date/Time: 12/26/2022 1:40 PM  Performed by: Alease Medina, CRNAPre-anesthesia Checklist: Patient identified, Emergency Drugs available, Suction available and Patient being monitored Patient Re-evaluated:Patient Re-evaluated prior to induction Oxygen Delivery Method: Circle system utilized Preoxygenation: Pre-oxygenation with 100% oxygen Induction Type: IV induction Ventilation: Mask ventilation without difficulty Laryngoscope Size: Mac and 4 Grade View: Grade I Tube type: Oral Tube size: 7.5 mm Number of attempts: 1 Airway Equipment and Method: Stylet and Oral airway Placement Confirmation: ETT inserted through vocal cords under direct vision, positive ETCO2 and breath sounds checked- equal and bilateral Secured at: 22 cm Tube secured with: Tape Dental Injury: Teeth and Oropharynx as per pre-operative assessment

## 2022-12-26 NOTE — Progress Notes (Signed)
Pt c/o "feeling like something needs to come out but can't" with burning/stinging at entry site of catheter. Catheter flushed w/o difficulty or complaint from pt, no blood noted, 300cc of urine in bag and about 10cc in line, bladder scan performed showing 88mL x3, on-call provider Liana Crocker) notified, order placed for urinalysis from catheter, specimen obtained and sent to lab.

## 2022-12-27 DIAGNOSIS — S72001A Fracture of unspecified part of neck of right femur, initial encounter for closed fracture: Secondary | ICD-10-CM | POA: Diagnosis not present

## 2022-12-27 LAB — CBC
HCT: 35.5 % — ABNORMAL LOW (ref 39.0–52.0)
Hemoglobin: 11.3 g/dL — ABNORMAL LOW (ref 13.0–17.0)
MCH: 29 pg (ref 26.0–34.0)
MCHC: 31.8 g/dL (ref 30.0–36.0)
MCV: 91.3 fL (ref 80.0–100.0)
Platelets: 148 10*3/uL — ABNORMAL LOW (ref 150–400)
RBC: 3.89 MIL/uL — ABNORMAL LOW (ref 4.22–5.81)
RDW: 14.3 % (ref 11.5–15.5)
WBC: 10.4 10*3/uL (ref 4.0–10.5)
nRBC: 0 % (ref 0.0–0.2)

## 2022-12-27 LAB — BASIC METABOLIC PANEL
Anion gap: 11 (ref 5–15)
BUN: 42 mg/dL — ABNORMAL HIGH (ref 8–23)
CO2: 20 mmol/L — ABNORMAL LOW (ref 22–32)
Calcium: 8.2 mg/dL — ABNORMAL LOW (ref 8.9–10.3)
Chloride: 103 mmol/L (ref 98–111)
Creatinine, Ser: 2.26 mg/dL — ABNORMAL HIGH (ref 0.61–1.24)
GFR, Estimated: 29 mL/min — ABNORMAL LOW (ref >60)
Glucose, Bld: 245 mg/dL — ABNORMAL HIGH (ref 70–99)
Potassium: 4.4 mmol/L (ref 3.5–5.1)
Sodium: 134 mmol/L — ABNORMAL LOW (ref 135–145)

## 2022-12-27 LAB — GLUCOSE, CAPILLARY
Glucose-Capillary: 217 mg/dL — ABNORMAL HIGH (ref 70–99)
Glucose-Capillary: 271 mg/dL — ABNORMAL HIGH (ref 70–99)
Glucose-Capillary: 203 mg/dL — ABNORMAL HIGH (ref 70–99)
Glucose-Capillary: 218 mg/dL — ABNORMAL HIGH (ref 70–99)
Glucose-Capillary: 195 mg/dL — ABNORMAL HIGH (ref 70–99)

## 2022-12-27 NOTE — Evaluation (Signed)
Physical Therapy Evaluation Patient Details Name: Fernando Hess MRN: [id number] DOB: [birth year] Today's Date: 12/27/2022  History of Present Illness  77 y.o. male admitted 4/10 after a fall resulting in  Right displaced femoral neck fracture, s/p Right hip hemiarthroplasty on 4/12. PMH significant for DM2, HTN, HLD, OSA not on CPAP, CAD s/p CABG, PAF on Eliquis, chronic diastolic CHF,CKD stage IIIb, depression/anxiety, cognitive impairment.  Clinical Impression  Pt admitted with above diagnosis. Poor historian, a bit convoluted with word finding difficulties. 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 LLE weakness with distal footdrop; RLE weakness limited by pain/guarding as expected post op. Disoriented to month only. Max assist for bed mobility and transfers. Incontinent of stool when attempting to stand. Assisted with pericare/hygiene. Reports he was ambulatory with rollator PTA but has had multiple falls at home. Needs assistance from an aide to bath. Pt currently with functional limitations due to the deficits listed below (see PT Problem List). Pt will benefit from acute skilled PT to increase their independence and safety with mobility to allow discharge.      Patient will benefit from continued inpatient follow up therapy, <3 hours/day      Recommendations for follow up therapy are one component of a multi-disciplinary discharge planning process, led by the attending physician.  Recommendations may be updated based on patient status, additional functional criteria and insurance authorization.  Follow Up Recommendations Can patient physically be transported by private vehicle: No     Assistance Recommended at Discharge Frequent or constant Supervision/Assistance  Patient can return home with the following  Two people to help with walking and/or transfers;Two people to help with bathing/dressing/bathroom;Assistance with cooking/housework;Direct supervision/assist for medications management;Direct  supervision/assist for financial management;Assist for transportation;Help with stairs or ramp for entrance    Equipment Recommendations None recommended by PT  Recommendations for Other Services       Functional Status Assessment Patient has had a recent decline in their functional status and demonstrates the ability to make significant improvements in function in a reasonable and predictable amount of time.     Precautions / Restrictions Precautions Precautions: Fall Restrictions Weight Bearing Restrictions: Yes RLE Weight Bearing: Weight bearing as tolerated      Mobility  Bed Mobility Overal bed mobility: Needs Assistance Bed Mobility: Supine to Sit     Supine to sit: Max assist     General bed mobility comments: Max assist to faciliate and assist with LEs and trunk to rise to EOB. Pt able to pull through therpaist's hand. Max assist to return to supine with LE and trunk support. Difficulty following commands.    Transfers Overall transfer level: Needs assistance Equipment used: Rolling walker (2 wheels) Transfers: Sit to/from Stand Sit to Stand: Total assist           General transfer comment: Performed 3/4 stand twice, unable to rise fully. Pt limited by increased LLE tone, posterior pushing, and episode of bowel incontinence.    Ambulation/Gait                  Stairs            Wheelchair Mobility    Modified Rankin (Stroke Patients Only)       Balance Overall balance assessment: Needs assistance Sitting-balance support: Feet supported, Bilateral upper extremity supported Sitting balance-Leahy Scale: Poor Sitting balance - Comments: Frequent assist for LOB towards Rt and posterior. Postural control: Posterior lean, Right lateral lean Standing balance support: Bilateral upper extremity supported, Reliant on assistive  device for balance Standing balance-Leahy Scale: Zero                               Pertinent  Vitals/Pain Pain Assessment Pain Assessment: 0-10 Pain Score: 5  Pain Location: Rt hip Pain Descriptors / Indicators: Aching Pain Intervention(s): Monitored during session, Repositioned    Home Living Family/patient expects to be discharged to:: Unsure Living Arrangements: Spouse/significant other Available Help at Discharge: Family;Friend(s) Type of Home: House Home Access: Stairs to enter (Ramp in back) Entrance Stairs-Rails: Left Entrance Stairs-Number of Steps: 5   Home Layout: Two level;Able to live on main level with bedroom/bathroom Home Equipment: Rollator (4 wheels);Shower seat;Grab bars - tub/shower;BSC/3in1;Wheelchair - manual      Prior Function Prior Level of Function : Needs assist             Mobility Comments: uses rollator at home, frequent falls at baseline.  w/c use more recently. ADLs Comments: States an aide helps him bath.     Hand Dominance   Dominant Hand: Right    Extremity/Trunk Assessment   Upper Extremity Assessment Upper Extremity Assessment: Defer to OT evaluation    Lower Extremity Assessment Lower Extremity Assessment: LLE deficits/detail;Difficult to assess due to impaired cognition LLE Deficits / Details: 1/5 Lt ankle dorsiflexion.       Communication   Communication: Expressive difficulties  Cognition Arousal/Alertness: Awake/alert Behavior During Therapy: WFL for tasks assessed/performed Overall Cognitive Status: No family/caregiver present to determine baseline cognitive functioning                                 General Comments: Pt disoriented to month, higher level cognitive tasks. Making jokes appropriately. Unable to consistently provide history. Word finding difficulties.        General Comments General comments (skin integrity, edema, etc.): SpO2 95% on supplemental O2 during session. Assisted with peri-care/hygiene.    Exercises     Assessment/Plan    PT Assessment Patient needs continued PT  services  PT Problem List Decreased strength;Decreased range of motion;Decreased activity tolerance;Decreased balance;Decreased mobility;Decreased coordination;Decreased cognition;Decreased knowledge of use of DME;Decreased safety awareness;Decreased knowledge of precautions;Impaired tone;Obesity;Pain       PT Treatment Interventions DME instruction;Gait training;Functional mobility training;Therapeutic activities;Therapeutic exercise;Balance training;Neuromuscular re-education;Cognitive remediation;Patient/family education;Wheelchair mobility training;Modalities    PT Goals (Current goals can be found in the Care Plan section)  Acute Rehab PT Goals Patient Stated Goal: None stated PT Goal Formulation: Patient unable to participate in goal setting Time For Goal Achievement: 01/10/23 Potential to Achieve Goals: Fair    Frequency Min 3X/week     Co-evaluation               AM-PAC PT "6 Clicks" Mobility  Outcome Measure Help needed turning from your back to your side while in a flat bed without using bedrails?: A Lot Help needed moving from lying on your back to sitting on the side of a flat bed without using bedrails?: A Lot Help needed moving to and from a bed to a chair (including a wheelchair)?: Total Help needed standing up from a chair using your arms (e.g., wheelchair or bedside chair)?: Total Help needed to walk in hospital room?: Total Help needed climbing 3-5 steps with a railing? : Total 6 Click Score: 8    End of Session Equipment Utilized During Treatment: Gait belt Activity Tolerance: Patient limited by  pain Patient left: in bed;with call bell/phone within reach;with bed alarm set;with nursing/sitter in room Nurse Communication: Mobility status;Need for lift equipment PT Visit Diagnosis: Unsteadiness on feet (R26.81);Repeated falls (R29.6);Muscle weakness (generalized) (M62.81);History of falling (Z91.81);Difficulty in walking, not elsewhere classified  (R26.2);Other symptoms and signs involving the nervous system (R29.898);Pain Pain - Right/Left: Right Pain - part of body: Hip    Time: 0928-1009 PT Time Calculation (min) (ACUTE ONLY): 41 min   Charges:   PT Evaluation $PT Eval Moderate Complexity: 1 Mod PT Treatments $Therapeutic Activity: 23-37 mins        Kathlyn Sacramento, PT, DPT Physical Therapist Acute Rehabilitation Services [facility] Out[facility]   Berton Mount 12/27/2022, 10:35 AM

## 2022-12-27 NOTE — Progress Notes (Addendum)
PROGRESS NOTE  [name]  DOB: [birth year]  PCP: [name], MD ZOX:[id number]  DOA: 12/24/2022  LOS: 3 days  Hospital Day: 4  Brief narrative: Fernando Hess is a 77 y.o. male with PMH significant for DM2, HTN, HLD, OSA not on CPAP, CAD s/p CABG, PAF on Eliquis, chronic diastolic CHF,CKD stage IIIb, depression/anxiety, cognitive impairment who lives at home with his spouse, normally ambulates with a walker or cane. 4/10, patient was brought to the ED from home after a fall while transferring from chair to walker, impacting right hip leading to immediate pain.  No head injury or loss of consciousness. Patient reports occasional lightheadedness when standing but it did not occur this time. Last dose of Eliquis on the morning of 4/10  In the ED, hemodynamically stable Labs with creatinine 1.88, glucose 259, hemoglobin 13.3 Right hip x-ray showed an acute impacted subcapital right femoral neck fracture. Chest x-ray showed elevated right hemidiaphragm, right basilar atelectasis, prior sternotomy and CABG changes. Patient was given IV pain medicines Underwent surgical intervention Admitted to [facility]  Subjective: Patient was seen and examined this morning . Reports of patient being altered overnight. This morning reports pain is well controlled. He worked with PT and was able to stand and move hip.   Assessment and plan: Acute impacted subcapital right femoral neck fracture Secondary to a mechanical fall  Oral and IV opioids as needed for pain management Resume eliquis  Acute respiratory failure with hypoxia 4/11, patient was dyspneic and required supplemental oxygen. X-ray with hyperinflation of both lungs and mild bibasilar atelectasis. Patient was started on 3 L oxygen by nasal cannula. Will plan to wean O2 today and further wokrup tomorrow if unsuccessful  AKI on CKD 3B Baseline creatinine 1.9 from January.  4/11, creatinine elevated to 2.19.  IV hydration could not be  given because of dyspnea.  Creatinine has further worsened to 2.61   Will check BMP today and determine need for fluid Recent Labs    09/04/22 1500 09/05/22 0818 09/17/22 1201 09/18/22 0023 09/20/22 0932 10/12/22 0125 10/12/22 0143 12/24/22 1612 12/25/22 0126 12/26/22 0847  BUN 28* 29* 30* 35* 19 23 39*  CREATININE 2.24* 2.00* 1.99*  1.90* 2.01* 1.80* 1.94* 1.90* 1.88* 2.19* 2.61*    Chronic diastolic CHF Volume status stable Most recent echo from 2022 with EF 60 to 65%.   PTA, not on beta-blocker due to history of bradycardia.  Not on diuretics/ACE/ARB/spironolactone because of CKD.  Paroxysmal atrial fibrillation Currently in normal sinus rhythm.  PTA not on any AV nodal blocking agent due to history of bradycardia Chronically anticoagulated on Eliquis 5 mg twice daily.  Currently on hold.   CAD s/p CABG HLD  Denies anginal symptoms.  Eliquis plan as above Continue Lipitor  UTI- plan to continue abx for 3 days with CTX   Type 2 diabetes mellitus uncontrolled with hyperglycemia A1c 10.7 in 2024 PTA on Basaglar 40 units morning, Humalog 3 times daily Currently on Semglee 30 units daily and sliding scale insulin Accu-Cheks. Recent Labs  Lab 12/26/22 1513 12/26/22 1617 12/26/22 2125 12/27/22 0428 12/27/22 0813  GLUCAP 121* 166* 237* 217* 271*     Depression with anxiety Continue Cymbalta, Bupropion, and Seroquel.   Mobility: Needs PT eval postprocedure  Goals of care   Code Status: Full Code     DVT prophylaxis: Plan to resume Eliquis after procedure apixaban (ELIQUIS) tablet 2.5 mg Start: 12/27/22 1000 SCDs Start: 12/24/22 1856  apixaban (ELIQUIS) tablet 2.5 mg  apixaban (ELIQUIS) tablet 5 mg   Antimicrobials: none Fluid: Not currently Consultants: Orthopedics Family Communication: Family not at bedside  Status: Inpatient Level of care:  Med-Surg   Needs to continue in-hospital care:  Surgery today.  Patient from:  Home Anticipated d/c to: Pending surgery and postop clinical course    Diet:  Diet Order             Diet Carb Modified Fluid consistency: Thin; Room service appropriate? Yes  Diet effective now                   Scheduled Meds:  apixaban  2.5 mg Oral BID   [START ON 12/29/2022] apixaban  5 mg Oral BID   atorvastatin  80 mg Oral QPM   buPROPion  150 mg Oral q morning   Chlorhexidine Gluconate Cloth  6 each Topical Daily   DULoxetine  90 mg Oral q morning   feeding supplement (GLUCERNA SHAKE)  237 mL Oral TID BM   insulin aspart  0-15 Units Subcutaneous TID WC   insulin aspart  0-5 Units Subcutaneous QHS   insulin glargine-yfgn  30 Units Subcutaneous Daily   multivitamin with minerals  1 tablet Oral Daily   mupirocin ointment  1 Application Nasal BID   pantoprazole  40 mg Oral q AM   QUEtiapine  50 mg Oral Daily    PRN meds: HYDROcodone-acetaminophen, HYDROmorphone (DILAUDID) injection, ipratropium, senna-docusate   Infusions:   sodium chloride 75 mL/hr at 12/27/22 0815   cefTRIAXone (ROCEPHIN)  IV 1 g (12/27/22 0076)    Antimicrobials: Anti-infectives (From admission, onward)    Start     Dose/Rate Route Frequency Ordered Stop   12/27/22 0800  ceFAZolin (ANCEF) IVPB 2g/100 mL premix        2 g 200 mL/hr over 30 Minutes Intravenous To ShortStay Surgical 12/26/22 1059 12/26/22 1410   12/26/22 1107  ceFAZolin (ANCEF) 2-4 GM/100ML-% IVPB       Note to Pharmacy: [facility], [place]: cabinet override      12/26/22 1107 12/26/22 1400   12/26/22 0600  cefTRIAXone (ROCEPHIN) 1 g in sodium chloride 0.9 % 100 mL IVPB        1 g 200 mL/hr over 30 Minutes Intravenous Every 24 hours 12/26/22 0520         Nutritional status:  Body mass index is 24.55 kg/m.  Nutrition Problem: Increased nutrient needs Etiology: hip fracture, post-op healing Signs/Symptoms: estimated needs     Objective: Vitals:   12/27/22 0422 12/27/22 0804  BP: (!) 169/91 133/81  Pulse: (!) 105 96   Resp: 20 20  Temp:  98.7 F (37.1 C)  SpO2: (!) 88% 98%    Intake/Output Summary (Last 24 hours) at 12/27/2022 1153 Last data filed at 12/27/2022 1029 Gross per 24 hour  Intake 1411.66 ml  Output 925 ml  Net 486.66 ml    Filed Weights   12/24/22 1444 12/26/22 1117  Weight: 68 kg 69 kg   Weight change:  Body mass index is 24.55 kg/m.   Physical Exam: General exam: Pleasant, elderly Caucasian male.  Not in distress. Skin: No rashes, lesions or ulcers. HEENT: Atraumatic, normocephalic, no obvious bleeding Lungs: Clear to auscultation bilaterally,  CVS: regular rate and rhythm, no murmur GI/Abd Soft, nontender, nondistended, bowel sound present CNS: alert, awake, oriented times 3 Psychiatry: Mood appropriate Extremities: No pedal edema, no calf tenderness  Data Review: I have personally reviewed the laboratory data  and studies available.  F/u labs ordered Unresulted Labs (From admission, onward)     Start     Ordered   12/27/22 1059  CBC  Once,   R        12/27/22 1059   12/27/22 1059  Basic metabolic panel  Once,   R        12/27/22 1059   12/26/22 0956  Urinalysis, Routine w reflex microscopic -Urine, Clean Catch  Add-on,   AD       Question:  Specimen Source  Answer:  Urine, Clean Catch   12/26/22 0956            Total time spent in review of labs and imaging, patient evaluation, formulation of plan, documentation and communication with family: 45 minutes  Signed, Alan Mulder, MD [facility] 12/27/2022

## 2022-12-27 NOTE — Progress Notes (Signed)
Subjective: 1 Day Post-Op s/p Procedure(s): RIGHT HEMI HIP ARTHROPLASTY   Patient is alert, oriented. States pain is so far well controlled. Foley catheter in place. States he has had a bowel movement. Asked patient about LLE weakness seen by PT during session today after previous CVA, says he doesn't know anything about it. Denies chest pain, SOB, Calf pain. No nausea/vomiting. No other complaints.   Objective:  PE: VITALS:   Vitals:   12/26/22 1619 12/26/22 2015 12/27/22 0422 12/27/22 0804  BP: 106/67 139/82 (!) 169/91 133/81  Pulse: 95 (!) 101 (!) 105 96  Resp: 18 18 20 20   Temp: 98.2 F (36.8 C) 98.7 F (37.1 C)  98.7 F (37.1 C)  TempSrc: Oral Oral  Oral  SpO2: 94% 98% (!) 88% 98%  Weight:      Height:       General: laying in bed, in no acute distress Resp: normal respiratory effort, nasal cannula in place GI: soft, nontender abdomen MSK: Incision CDI. Dorsiflexion/plantarflexion intact at right ankle. Distal sensation intact. 2+ PT pulse. No ability to dorsiflex at left ankle.    LABS  Results for orders placed or performed during the hospital encounter of 12/24/22 (from the past 24 hour(s))  Surgical PCR screen     Status: None   Collection Time: 12/26/22 11:02 AM   Specimen: Nasal Mucosa; Nasal Swab  Result Value Ref Range   MRSA, PCR NEGATIVE NEGATIVE   Staphylococcus aureus NEGATIVE NEGATIVE  Glucose, capillary     Status: Abnormal   Collection Time: 12/26/22 11:21 AM  Result Value Ref Range   Glucose-Capillary 159 (H) 70 - 99 mg/dL   Comment 1 Notify RN    Comment 2 Document in Chart   Glucose, capillary     Status: Abnormal   Collection Time: 12/26/22  1:24 PM  Result Value Ref Range   Glucose-Capillary 141 (H) 70 - 99 mg/dL  Glucose, capillary     Status: Abnormal   Collection Time: 12/26/22  3:13 PM  Result Value Ref Range   Glucose-Capillary 121 (H) 70 - 99 mg/dL  Glucose, capillary     Status: Abnormal   Collection Time: 12/26/22  4:17 PM   Result Value Ref Range   Glucose-Capillary 166 (H) 70 - 99 mg/dL  Glucose, capillary     Status: Abnormal   Collection Time: 12/26/22  9:25 PM  Result Value Ref Range   Glucose-Capillary 237 (H) 70 - 99 mg/dL  Glucose, capillary     Status: Abnormal   Collection Time: 12/27/22  4:28 AM  Result Value Ref Range   Glucose-Capillary 217 (H) 70 - 99 mg/dL  Glucose, capillary     Status: Abnormal   Collection Time: 12/27/22  8:13 AM  Result Value Ref Range   Glucose-Capillary 271 (H) 70 - 99 mg/dL    DG HIP UNILAT W OR W/O PELVIS 2-3 VIEWS RIGHT  Result Date: 12/26/2022 CLINICAL DATA:  Postop EXAM: DG HIP (WITH OR WITHOUT PELVIS) 2-3V RIGHT COMPARISON:  12/24/2022 FINDINGS: Interval right hip replacement with intact hardware and normal alignment. SI joints are patent. Pubic symphysis is intact. Gas in the soft tissues consistent with recent surgery IMPRESSION: Status post right hip replacement with expected postsurgical change. Electronically Signed   By: Jasmine Pang M.D.   On: 12/26/2022 16:01   DG Knee Right Port  Result Date: 12/25/2022 CLINICAL DATA:  Femoral neck fracture EXAM: PORTABLE RIGHT KNEE - 1-2 VIEW COMPARISON:  Hip radiograph 12/24/2022  FINDINGS: No fracture or malalignment. No sizable knee effusion. Clips in the proximal lower leg IMPRESSION: Negative. Electronically Signed   By: Jasmine Pang M.D.   On: 12/25/2022 20:47   DG Chest Port 1 View  Result Date: 12/25/2022 CLINICAL DATA:  Dyspnea. EXAM: PORTABLE CHEST 1 VIEW COMPARISON:  December 24, 2022. FINDINGS: Stable cardiomediastinal silhouette. Status post coronary bypass graft. Hypoinflation of the lungs with mild bibasilar subsegmental atelectasis or possibly infiltrate. Bony thorax is unremarkable. IMPRESSION: Hypoinflation of the lungs with mild bibasilar subsegmental atelectasis or possibly infiltrates. Electronically Signed   By: Lupita Raider M.D.   On: 12/25/2022 16:00    Assessment/Plan: Right femoral neck  fracture 1 Day Post-Op s/p Procedure(s): RIGHT HEMI HIP ARTHROPLASTY - BMP and CBC ordered  Post op recs: WB: WBAT, no formal hip precautions. 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 Blanchie Dessert at [facility].  Address: [address], [place], [name] [address]  Office Phone: [phone]  Armida Sans 12/27/2022, 10:57 AM

## 2022-12-27 NOTE — Anesthesia Postprocedure Evaluation (Signed)
Anesthesia Post Note  Patient: Fernando Hess  Procedure(s) Performed: RIGHT HEMI HIP ARTHROPLASTY (Right: Hip)     Patient location during evaluation: PACU Anesthesia Type: General Level of consciousness: awake and alert Pain management: pain level controlled Vital Signs Assessment: post-procedure vital signs reviewed and stable Respiratory status: spontaneous breathing, nonlabored ventilation and respiratory function stable Cardiovascular status: blood pressure returned to baseline and stable Postop Assessment: no apparent nausea or vomiting Anesthetic complications: no   No notable events documented.  Last Vitals:  Vitals:   12/26/22 2015 12/27/22 0422  BP: 139/82 (!) 169/91  Pulse: (!) 101 (!) 105  Resp: 18 20  Temp: 37.1 C   SpO2: 98% (!) 88%    Last Pain:  Vitals:   12/26/22 2156  TempSrc:   PainSc: 8                  Lowella Curb

## 2022-12-28 ENCOUNTER — Inpatient Hospital Stay (HOSPITAL_COMMUNITY): Payer: Medicare Other

## 2022-12-28 DIAGNOSIS — S72001A Fracture of unspecified part of neck of right femur, initial encounter for closed fracture: Secondary | ICD-10-CM | POA: Diagnosis not present

## 2022-12-28 LAB — GLUCOSE, CAPILLARY
Glucose-Capillary: 230 mg/dL — ABNORMAL HIGH (ref 70–99)
Glucose-Capillary: 176 mg/dL — ABNORMAL HIGH (ref 70–99)
Glucose-Capillary: 145 mg/dL — ABNORMAL HIGH (ref 70–99)
Glucose-Capillary: 153 mg/dL — ABNORMAL HIGH (ref 70–99)

## 2022-12-28 MED ORDER — FUROSEMIDE 10 MG/ML IJ SOLN
60.0000 mg | Freq: Once | INTRAMUSCULAR | Status: AC
  Administered 2022-12-28: 60 mg via INTRAVENOUS
  Filled 2022-12-28: qty 6

## 2022-12-28 NOTE — [place] FL2 (Signed)
[place] MEDICAID FL2 LEVEL OF CARE FORM     IDENTIFICATION  Patient Name: Fernando Hess Birthdate: 04-28-1946 Sex: male Admission Date (Current Location): 12/24/2022  [facility] and [place] Number:  Producer, television/film/video and Address:  [place]. [facility], [address], [place], [name] [address]      Provider Number: [id number]  Attending Physician Name and Address:  Alan Mulder, MD  Relative Name and Phone Number:  Carney Bern (Spouse) [phone]    Current Level of Care: Hospital Recommended Level of Care: Skilled Nursing Facility Prior Approval Number:    Date Approved/Denied:   PASRR Number: PASRR under review  Discharge Plan: SNF    Current Diagnoses: Patient Active Problem List   Diagnosis Date Noted   Fracture of femoral neck, right, closed 12/24/2022   Depression with anxiety 12/24/2022   Acute metabolic encephalopathy 09/18/2022   Acute encephalopathy 09/17/2022   AKI (acute kidney injury) 09/04/2022   Generalized weakness 08/12/2022   Myasthenia gravis 04/21/2022   Chronic kidney disease, stage 3b 03/21/2022   Posterior vitreous detachment of both eyes 01/23/2022   Constipation 11/13/2021   Diabetes mellitus, type 2 11/13/2021   Severe major depression, single episode, without psychotic features 11/13/2021   Amaurosis fugax 11/13/2021   Amnesia 11/13/2021   Anxiety 11/13/2021   Cardiac arrhythmia 11/13/2021   Hearing loss 11/13/2021   Hypercoagulable state 11/13/2021   Hyperparathyroidism due to renal insufficiency 11/13/2021   Mild aortic stenosis 11/05/2021   Paroxysmal atrial fibrillation 11/05/2021   Nasal septal deviation 08/30/2021   Anticoagulated by anticoagulation treatment 07/16/2021   Syncope 05/10/2021   Gastro-esophageal reflux disease without esophagitis 07/25/2020   Globus pharyngeus 07/25/2020   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 12/21/2019   Choroidal nevus of right eye  12/21/2019   Chronic heart failure with preserved ejection fraction (HFpEF) 11/18/2019   OSA on CPAP 03/07/2019   Peripheral artery disease    Intractable vascular headache 11/11/2018   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   Coronary artery disease involving native coronary artery of native heart without angina pectoris 09/24/2015   Moderate nonproliferative diabetic retinopathy of left eye with macular edema associated with type 2 diabetes mellitus 03/01/2008   Hyperlipidemia LDL goal <70    Essential hypertension    History of kidney stones     Orientation RESPIRATION BLADDER Height & Weight     Self, Place  O2 (Nasal Cannual 2 liters) Incontinent (Urethral Catheter) Weight: 152 lb 1.9 oz (69 kg) Height:   (167.6 cm)  BEHAVIORAL SYMPTOMS/MOOD NEUROLOGICAL BOWEL NUTRITION STATUS      Incontinent Diet (Please see discharge summary)  AMBULATORY STATUS COMMUNICATION OF NEEDS Skin   Extensive Assist Verbally Other (Comment) (Ecchymosis,scattered,Wound/Incision LDAs,Incision closed Hip,Right)                       Personal Care Assistance Level of Assistance  Bathing, Feeding, Dressing Bathing Assistance: Maximum assistance Feeding assistance: Independent Dressing Assistance: Maximum assistance     Functional Limitations Info  Sight, Hearing, Speech Sight Info:  (eyeglasses) Hearing Info: Adequate Speech Info: Adequate    SPECIAL CARE FACTORS FREQUENCY  PT (By licensed PT), OT (By licensed OT)     PT Frequency: 5x min weekly OT Frequency: 5x min weekly            Contractures Contractures Info: Not present    Additional Factors Info  Code Status, Allergies, Psychotropic, Insulin Sliding Scale Code  Status Info: FULL Allergies Info: Cilostazol,Dulaglutide,Levofloxacin,Liraglutide,Lisinopril Psychotropic Info: buPROPion (WELLBUTRIN XL) 24 hr tablet 150 mg every morning,QUEtiapine (SEROQUEL) tablet 50 mg daily,DULoxetine (CYMBALTA) DR capsule 90 mg every morning Insulin  Sliding Scale Info: insulin aspart (novoLOG) injection 0-15 Units 3 times daily with meals,insulin aspart (novoLOG) injection 0-5 Units daily at bedtime,insulin glargine-yfgn [facility]) injection 30 Units daily       Current Medications (12/28/2022):  This is the current hospital active medication list Current Facility-Administered Medications  Medication Dose Route Frequency Provider Last Rate Last Admin   0.9 %  sodium chloride infusion   Intravenous Continuous Cecil Cobbs, [name] 75 mL/hr at 12/27/22 2237 New Bag at 12/27/22 2237   apixaban (ELIQUIS) tablet 2.5 mg  2.5 mg Oral BID Cecil Cobbs, [name]   2.5 mg at 12/28/22 3235   [START ON 12/29/2022] apixaban (ELIQUIS) tablet 5 mg  5 mg Oral BID Kathie Dike M, [name]       atorvastatin (LIPITOR) tablet 80 mg  80 mg Oral QPM Kathie Dike M, [name]   80 mg at 12/27/22 1752   buPROPion (WELLBUTRIN XL) 24 hr tablet 150 mg  150 mg Oral q morning Cecil Cobbs, [name]   150 mg at 12/28/22 5732   cefTRIAXone (ROCEPHIN) 1 g in sodium chloride 0.9 % 100 mL IVPB  1 g Intravenous Q24H Kathie Dike M, [name] 200 mL/hr at 12/28/22 2025 1 g at 12/28/22 4270   Chlorhexidine Gluconate Cloth 2 % PADS 6 each  6 each Topical Daily Cecil Cobbs, [name]   6 each at 12/28/22 0815   DULoxetine (CYMBALTA) DR capsule 90 mg  90 mg Oral q morning Cecil Cobbs, [name]   90 mg at 12/28/22 6237   feeding supplement (GLUCERNA SHAKE) (GLUCERNA SHAKE) liquid 237 mL  237 mL Oral TID BM Cecil Cobbs, [name]   237 mL at 12/27/22 2033   HYDROcodone-acetaminophen (NORCO/VICODIN) 5-325 MG per tablet 1-2 tablet  1-2 tablet Oral Q6H PRN Cecil Cobbs, [name]   2 tablet at 12/26/22 2155   HYDROmorphone (DILAUDID) injection 0.5 mg  0.5 mg Intravenous Q2H PRN Cecil Cobbs, [name]   0.5 mg at 12/25/22 1719   insulin aspart (novoLOG) injection 0-15 Units  0-15 Units Subcutaneous TID WC Cecil Cobbs, [name]   3 Units at 12/28/22 1146    insulin aspart (novoLOG) injection 0-5 Units  0-5 Units Subcutaneous QHS Cecil Cobbs, [name]   2 Units at 12/26/22 2156   insulin glargine-yfgn [facility]) injection 30 Units  30 Units Subcutaneous Daily Cecil Cobbs, [name]   30 Units at 12/28/22 0815   ipratropium (ATROVENT) nebulizer solution 0.5 mg  0.5 mg Nebulization Q6H PRN Kathie Dike M, [name]       multivitamin with minerals tablet 1 tablet  1 tablet Oral Daily Cecil Cobbs, [name]   1 tablet at 12/28/22 6283   mupirocin ointment (BACTROBAN) 2 % 1 Application  1 Application Nasal BID Cecil Cobbs, [name]   1 Application at 12/28/22 0815   pantoprazole (PROTONIX) EC tablet 40 mg  40 mg Oral q AM Cecil Cobbs, [name]   40 mg at 12/28/22 1517   QUEtiapine (SEROQUEL) tablet 50 mg  50 mg Oral Daily Kathie Dike M, [name]   50 mg at 12/27/22 2234   senna-docusate (Senokot-S) tablet 1 tablet  1 tablet Oral QHS PRN Cecil Cobbs, [name]         Discharge Medications: Please see discharge summary  for a list of discharge medications.  Relevant Imaging Results:  Relevant Lab Results:   Additional Information [id number]  Delilah Shan, LCSWA

## 2022-12-28 NOTE — Progress Notes (Signed)
PROGRESS NOTE  Fernando Hess  DOB: 23-Jun-1946  PCP: [name], MD ZOX:[id number]  DOA: 12/24/2022  LOS: 4 days  Hospital Day: 5  Brief narrative: Fernando Hess is a 77 y.o. male with PMH significant for DM2, HTN, HLD, OSA not on CPAP, CAD s/p CABG, PAF on Eliquis, chronic diastolic CHF,CKD stage IIIb, depression/anxiety, cognitive impairment who lives at home with his spouse, normally ambulates with a walker or cane. 4/10, patient was brought to the ED from home after a fall while transferring from chair to walker, impacting right hip leading to immediate pain.  No head injury or loss of consciousness. Patient reports occasional lightheadedness when standing but it did not occur this time. Last dose of Eliquis on the morning of 4/10  In the ED, hemodynamically stable Labs with creatinine 1.88, glucose 259, hemoglobin 13.3 Right hip x-ray showed an acute impacted subcapital right femoral neck fracture. Chest x-ray showed elevated right hemidiaphragm, right basilar atelectasis, prior sternotomy and CABG changes. Patient was given IV pain medicines Underwent surgical intervention Admitted to [facility]  Subjective: Patient was seen and examined this morning . Reports of patient being altered overnight. This morning reports pain is well controlled. His mental status seems to be waxing and waning most likely delirium though will obtain CTH to further asess   Assessment and plan: Acute impacted subcapital right femoral neck fracture Secondary to a mechanical fall  Oral and IV opioids as needed for pain management Resume eliquis  Acute respiratory failure with hypoxia 4/11, patient was dyspneic and required supplemental oxygen. X-ray with hyperinflation of both lungs and mild bibasilar atelectasis. Patient was started on 3 L oxygen by nasal cannula. Will plan to wean O2 today Obtain VQ scan, BNP is morning.  Obtain CXR   AKI on CKD 3B Baseline creatinine 1.9 from January.   4/11, Cr stable  Will check BMP today and determine need for fluid Recent Labs    09/05/22 0818 09/17/22 1201 09/18/22 0023 09/20/22 0932 10/12/22 0125 10/12/22 0143 12/24/22 1612 12/25/22 0126 12/26/22 0847 12/27/22 1154  BUN 29* 30* 35* 19 23 39* 42*  CREATININE 2.00* 1.99*  1.90* 2.01* 1.80* 1.94* 1.90* 1.88* 2.19* 2.61* 2.26*    Chronic diastolic CHF Volume status stable Most recent echo from 2022 with EF 60 to 65%.   PTA, not on beta-blocker due to history of bradycardia.  Not on diuretics/ACE/ARB/spironolactone because of CKD. Consider Echo tomorrow if hypoxia persists.   Paroxysmal atrial fibrillation Currently in normal sinus rhythm.  PTA not on any AV nodal blocking agent due to history of bradycardia Chronically anticoagulated on Eliquis 5 mg twice daily.  CAD s/p CABG HLD  Denies anginal symptoms.  Eliquis plan as above Continue Lipitor  UTI- plan to continue abx for 3 days with CTX   Type 2 diabetes mellitus uncontrolled with hyperglycemia A1c 10.7 in 2024 PTA on Basaglar 40 units morning, Humalog 3 times daily Currently on Semglee 30 units daily and sliding scale insulin Accu-Cheks. Recent Labs  Lab 12/27/22 1201 12/27/22 1607 12/27/22 2055 12/28/22 0756 12/28/22 1139  GLUCAP 203* 218* 195* 230* 176*     Depression with anxiety Continue Cymbalta, Bupropion, and Seroquel.   Mobility: Needs PT eval postprocedure  Goals of care   Code Status: Full Code     DVT prophylaxis: Plan to resume Eliquis after procedure apixaban (ELIQUIS) tablet 2.5 mg Start: 12/27/22 1000 SCDs Start: 12/24/22 1856 apixaban (ELIQUIS) tablet 2.5 mg  apixaban (  ELIQUIS) tablet 5 mg   Antimicrobials: none Fluid: Not currently Consultants: Orthopedics Family Communication: Family not at bedside  Status: Inpatient Level of care:  Med-Surg   Needs to continue in-hospital care:  Surgery today.  Patient from: Home Anticipated d/c to: Pending  surgery and postop clinical course    Diet:  Diet Order             Diet Carb Modified Fluid consistency: Thin; Room service appropriate? Yes  Diet effective now                   Scheduled Meds:  apixaban  2.5 mg Oral BID   [START ON 12/29/2022] apixaban  5 mg Oral BID   atorvastatin  80 mg Oral QPM   buPROPion  150 mg Oral q morning   Chlorhexidine Gluconate Cloth  6 each Topical Daily   DULoxetine  90 mg Oral q morning   feeding supplement (GLUCERNA SHAKE)  237 mL Oral TID BM   insulin aspart  0-15 Units Subcutaneous TID WC   insulin aspart  0-5 Units Subcutaneous QHS   insulin glargine-yfgn  30 Units Subcutaneous Daily   multivitamin with minerals  1 tablet Oral Daily   mupirocin ointment  1 Application Nasal BID   pantoprazole  40 mg Oral q AM   QUEtiapine  50 mg Oral Daily    PRN meds: HYDROcodone-acetaminophen, HYDROmorphone (DILAUDID) injection, ipratropium, senna-docusate   Infusions:   sodium chloride 75 mL/hr at 12/27/22 2237   cefTRIAXone (ROCEPHIN)  IV 1 g (12/28/22 7116)    Antimicrobials: Anti-infectives (From admission, onward)    Start     Dose/Rate Route Frequency Ordered Stop   12/27/22 0800  ceFAZolin (ANCEF) IVPB 2g/100 mL premix        2 g 200 mL/hr over 30 Minutes Intravenous To [facility]Stay Surgical 12/26/22 1059 12/26/22 1410   12/26/22 1107  ceFAZolin (ANCEF) 2-4 GM/100ML-% IVPB       Note to Pharmacy: [facility], [place]: cabinet override      12/26/22 1107 12/26/22 1400   12/26/22 0600  cefTRIAXone (ROCEPHIN) 1 g in sodium chloride 0.9 % 100 mL IVPB        1 g 200 mL/hr over 30 Minutes Intravenous Every 24 hours 12/26/22 0520         Nutritional status:  Body mass index is 24.55 kg/m.  Nutrition Problem: Increased nutrient needs Etiology: hip fracture, post-op healing Signs/Symptoms: estimated needs     Objective: Vitals:   12/28/22 0800 12/28/22 1128  BP: (!) 151/96 (!) 129/58  Pulse: 95 75  Resp: 20   Temp: 98.1 F (36.7  C)   SpO2: 95%     Intake/Output Summary (Last 24 hours) at 12/28/2022 1551 Last data filed at 12/28/2022 1128 Gross per 24 hour  Intake --  Output 1875 ml  Net -1875 ml    Filed Weights   12/24/22 1444 12/26/22 1117  Weight: 68 kg 69 kg   Weight change:  Body mass index is 24.55 kg/m.   Physical Exam: General exam: Pleasant, elderly Caucasian male.  Not in distress. Skin: No rashes, lesions or ulcers. HEENT: Atraumatic, normocephalic, no obvious bleeding Lungs: Clear to auscultation bilaterally,  CVS: regular rate and rhythm, no murmur GI/Abd Soft, nontender, nondistended, bowel sound present CNS: alert, awake, oriented times X 1. No focal deficits on exam. 5/5 strength in upper extermities. Sensation intact.  Psychiatry: Mood appropriate Extremities: No pedal edema, no calf tenderness  Data Review: I have  personally reviewed the laboratory data and studies available.  F/u labs ordered Unresulted Labs (From admission, onward)     Start     Ordered   12/26/22 0956  Urinalysis, Routine w reflex microscopic -Urine, Clean Catch  Add-on,   AD       Question:  Specimen Source  Answer:  Urine, Clean Catch   12/26/22 0956            Total time spent in review of labs and imaging, patient evaluation, formulation of plan, documentation and communication with family: 45 minutes  Signed, Alan Mulder, MD [facility] 12/28/2022

## 2022-12-28 NOTE — Progress Notes (Signed)
OT Cancellation Note  Patient Details Name: Fernando Hess MRN: [id number] DOB: 01-Oct-1945   Cancelled Treatment:    Reason Eval/Treat Not Completed: Patient at procedure or test/ unavailable. Plan to reattempt.  Raynald Kemp, OT Acute Rehabilitation Services Office: [phone]   Pilar Grammes 12/28/2022, 9:24 AM

## 2022-12-28 NOTE — Progress Notes (Cosign Needed Addendum)
RE: Fernando Hess Date of Birth: 07-May-1946 Date: 12/28/2022   To Whom It May Concern:  Please be advised that the above-named patient will require a short-term nursing home stay - anticipated 30 days or less for rehabilitation and strengthening.  The plan is for return home.

## 2022-12-28 NOTE — Plan of Care (Signed)
  Problem: Activity: Goal: Risk for activity intolerance will decrease Outcome: Progressing   Problem: Coping: Goal: Level of anxiety will decrease Outcome: Progressing   Problem: Pain Managment: Goal: General experience of comfort will improve Outcome: Progressing   Problem: Safety: Goal: Ability to remain free from injury will improve Outcome: Progressing   

## 2022-12-28 NOTE — Progress Notes (Signed)
Subjective: 2 Days Post-Op s/p Procedure(s): RIGHT HEMI HIP ARTHROPLASTY   Patient is alert, oriented. States pain is severe this morning. Foley catheter in place. States he has had a bowel movement. Denies chest pain, SOB, Calf pain. No nausea/vomiting. No other complaints.   Objective:  PE: VITALS:   Vitals:   12/27/22 1435 12/27/22 2052 12/28/22 0454 12/28/22 0800  BP:  (!) 159/80 128/65 (!) 151/96  Pulse:  (!) 109 (!) 107 95  Resp:   20 20  Temp:  97.7 F (36.5 C) 98.9 F (37.2 C) 98.1 F (36.7 C)  TempSrc:  Oral Oral Oral  SpO2: 94% 93% 95% 95%  Weight:      Height:       General: laying in bed, in no acute distress Resp: normal respiratory effort, nasal cannula in place GI: soft, nontender abdomen MSK: Incision CDI. Dorsiflexion/plantarflexion intact at right ankle. Distal sensation intact. 2+ PT pulse. No ability to dorsiflex at left ankle.    LABS  Results for orders placed or performed during the hospital encounter of 12/24/22 (from the past 24 hour(s))  CBC     Status: Abnormal   Collection Time: 12/27/22 11:54 AM  Result Value Ref Range   WBC 10.4 4.0 - 10.5 K/uL   RBC 3.89 (L) 4.22 - 5.81 MIL/uL   Hemoglobin 11.3 (L) 13.0 - 17.0 g/dL   HCT 01.0 (L) 07.1 - 21.9 %   MCV 91.3 80.0 - 100.0 fL   MCH 29.0 26.0 - 34.0 pg   MCHC 31.8 30.0 - 36.0 g/dL   RDW 75.8 83.2 - 54.9 %   Platelets 148 (L) 150 - 400 K/uL   nRBC 0.0 0.0 - 0.2 %  Basic metabolic panel     Status: Abnormal   Collection Time: 12/27/22 11:54 AM  Result Value Ref Range   Sodium 134 (L) 135 - 145 mmol/L   Potassium 4.4 3.5 - 5.1 mmol/L   Chloride 103 98 - 111 mmol/L   CO2 20 (L) 22 - 32 mmol/L   Glucose, Bld 245 (H) 70 - 99 mg/dL   BUN 42 (H) 8 - 23 mg/dL   Creatinine, Ser 8.26 (H) 0.61 - 1.24 mg/dL   Calcium 8.2 (L) 8.9 - 10.3 mg/dL   GFR, Estimated 29 (L) >60 mL/min   Anion gap 11 5 - 15  Glucose, capillary     Status: Abnormal   Collection Time: 12/27/22 12:01 PM  Result Value  Ref Range   Glucose-Capillary 203 (H) 70 - 99 mg/dL  Glucose, capillary     Status: Abnormal   Collection Time: 12/27/22  4:07 PM  Result Value Ref Range   Glucose-Capillary 218 (H) 70 - 99 mg/dL  Glucose, capillary     Status: Abnormal   Collection Time: 12/27/22  8:55 PM  Result Value Ref Range   Glucose-Capillary 195 (H) 70 - 99 mg/dL  Glucose, capillary     Status: Abnormal   Collection Time: 12/28/22  7:56 AM  Result Value Ref Range   Glucose-Capillary 230 (H) 70 - 99 mg/dL    DG HIP UNILAT W OR W/O PELVIS 2-3 VIEWS RIGHT  Result Date: 12/26/2022 CLINICAL DATA:  Postop EXAM: DG HIP (WITH OR WITHOUT PELVIS) 2-3V RIGHT COMPARISON:  12/24/2022 FINDINGS: Interval right hip replacement with intact hardware and normal alignment. SI joints are patent. Pubic symphysis is intact. Gas in the soft tissues consistent with recent surgery IMPRESSION: Status post right hip replacement with expected postsurgical  change. Electronically Signed   By: Jasmine Pang M.D.   On: 12/26/2022 16:01    Assessment/Plan: Right femoral neck fracture 2 Days Post-Op s/p Procedure(s): RIGHT HEMI HIP ARTHROPLASTY  Post op recs: WB: WBAT, no formal hip precautions. 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 Blanchie Dessert at [facility].  Address: [address], [place], [name] [address]  Office Phone: [phone]  Armida Sans 12/28/2022, 9:31 AM

## 2022-12-28 NOTE — Evaluation (Signed)
Occupational Therapy Evaluation Patient Details Name: Fernando Hess MRN: [id number] DOB: 04-29-1946 Today's Date: 12/28/2022   History of Present Illness 77 y.o. male admitted 4/10 after a fall resulting in  Right displaced femoral neck fracture, s/p Right hip hemiarthroplasty on 4/12. PMH significant for DM2, HTN, HLD, OSA not on CPAP, CAD s/p CABG, PAF on Eliquis, chronic diastolic CHF,CKD stage IIIb, depression/anxiety, cognitive impairment.   Clinical Impression   Pt admitted with the above diagnoses and presents with below problem list. Pt will benefit from continued acute OT to address the below listed deficits and maximize independence with basic ADLs prior to d/c. At baseline, pt reports he needs assist with bathing/dressing. Unclear baseline with functional transfers/mobility, per chart pt uses walker vs cane. Pt currently needs up to total assist with LB ADLs and toileting, max A with bed mobility. Poor to zero sitting balance, posterior and right lateral lean "I lean to the right." Pt with some dizziness EOB, BP 129/58.        Recommendations for follow up therapy are one component of a multi-disciplinary discharge planning process, led by the attending physician.  Recommendations may be updated based on patient status, additional functional criteria and insurance authorization.   Assistance Recommended at Discharge Frequent or constant Supervision/Assistance  Patient can return home with the following      Functional Status Assessment  Patient has had a recent decline in their functional status and demonstrates the ability to make significant improvements in function in a reasonable and predictable amount of time.  Equipment Recommendations  Other (comment) (defer to next venue)    Recommendations for Other Services       Precautions / Restrictions Precautions Precautions: Fall Restrictions Weight Bearing Restrictions: Yes RLE Weight Bearing: Weight bearing as tolerated       Mobility Bed Mobility Overal bed mobility: Needs Assistance Bed Mobility: Supine to Sit, Sit to Supine     Supine to sit: Max assist, HOB elevated Sit to supine: Max assist, +2 for physical assistance   General bed mobility comments: Sequencing difficulty? cues and ultimately assist for all aspects. +2 to return to supine (1 person assisting trunk and guarding, second person to advance BLE.    Transfers                   General transfer comment: pt with poor to zero sitting balance, fatigue quickly EOB      Balance Overall balance assessment: Needs assistance, History of Falls Sitting-balance support: Feet supported, Bilateral upper extremity supported Sitting balance-Leahy Scale: Zero Sitting balance - Comments: initially poor sitting balance fatigueing after a couple of minutes to needing mod-max A d/t posteriot and right lateral lean. Postural control: Posterior lean, Right lateral lean                                 ADL either performed or assessed with clinical judgement   ADL Overall ADL's : Needs assistance/impaired Eating/Feeding: Set up;Bed level   Grooming: Minimal assistance;Bed level   Upper Body Bathing: Maximal assistance;Bed level;Moderate assistance   Lower Body Bathing: Total assistance;Bed level;+2 for physical assistance;+2 for safety/equipment   Upper Body Dressing : Bed level;Maximal assistance;Moderate assistance   Lower Body Dressing: Total assistance;+2 for physical assistance;Bed level                 General ADL Comments: Pt completed bed mobility and sat EOB with external support  for about 7 minutes. posterior and right lateral lean.     Vision         Perception     Praxis      Pertinent Vitals/Pain Pain Assessment Pain Assessment: 0-10 Pain Score: 5  Pain Location: Rt hip Pain Descriptors / Indicators: Aching Pain Intervention(s): Monitored during session, Limited activity within patient's  tolerance, Repositioned     Hand Dominance Right   Extremity/Trunk Assessment Upper Extremity Assessment Upper Extremity Assessment: Difficult to assess due to impaired cognition;Generalized weakness   Lower Extremity Assessment Lower Extremity Assessment: Defer to PT evaluation       Communication Communication Communication: Expressive difficulties   Cognition Arousal/Alertness: Awake/alert Behavior During Therapy: Flat affect, WFL for tasks assessed/performed Overall Cognitive Status: Difficult to assess                                 General Comments: Pt able to state name and general location "[place]." Unable to state "hospital despite cueing. States "fall" as reason for admission. Difficulty to understand at times, needed pt to repeat himself often. Low volume.     General Comments  Pt endorsed some dizziness EOB. BP 129/58.    Exercises     Shoulder Instructions      Home Living Family/patient expects to be discharged to:: Unsure Living Arrangements: Spouse/significant other Available Help at Discharge: Family;Friend(s) Type of Home: House Home Access: Stairs to enter (Ramp in back) Entrance Stairs-Number of Steps: 5 Entrance Stairs-Rails: Left Home Layout: Two level;Able to live on main level with bedroom/bathroom     Bathroom Shower/Tub: Tub/shower unit   Bathroom Toilet: Handicapped height     Home Equipment: Rollator (4 wheels);Shower seat;Grab bars - tub/shower;BSC/3in1;Wheelchair - manual          Prior Functioning/Environment Prior Level of Function : Needs assist             Mobility Comments: uses rollator at home, frequent falls at baseline.  w/c use more recently. ADLs Comments: pt endorses need for assist with ADLs did not elaborate further. Endorses difficulty with arranging for someone to assist him.        OT Problem List: Decreased strength;Decreased range of motion;Decreased activity tolerance;Impaired  balance (sitting and/or standing);Decreased cognition;Decreased safety awareness;Decreased knowledge of use of DME or AE;Decreased knowledge of precautions;Impaired tone;Pain      OT Treatment/Interventions: Therapeutic exercise;Self-care/ADL training;Neuromuscular education;DME and/or AE instruction;Energy conservation;Therapeutic activities;Cognitive remediation/compensation;Patient/family education;Balance training    OT Goals(Current goals can be found in the care plan section) Acute Rehab OT Goals Patient Stated Goal: agreeable to SNF for rehab at d/c, states he has done this before OT Goal Formulation: With patient Time For Goal Achievement: 01/11/23 Potential to Achieve Goals: Fair ADL Goals Pt Will Perform Grooming: with set-up;sitting Pt Will Perform Upper Body Bathing: with min assist;sitting Pt Will Perform Lower Body Bathing: with max assist Additional ADL Goal #1: Pt will complete bed mobility at mod A level to prepare for EOB/OOB ADLs.  OT Frequency: Min 2X/week    Co-evaluation              AM-PAC OT "6 Clicks" Daily Activity     Outcome Measure Help from another person eating meals?: A Little Help from another person taking care of personal grooming?: A Lot Help from another person toileting, which includes using toliet, bedpan, or urinal?: Total Help from another person bathing (including washing, rinsing, drying)?: Total Help  from another person to put on and taking off regular upper body clothing?: A Lot Help from another person to put on and taking off regular lower body clothing?: Total 6 Click Score: 10   End of Session Equipment Utilized During Treatment: Oxygen Nurse Communication: Mobility status;Other (comment) (Nurse present throughout session)  Activity Tolerance: Patient limited by fatigue;Patient limited by pain Patient left: in bed;with call bell/phone within reach;Other (comment) (on bed pan, nurse aware)  OT Visit Diagnosis: Unsteadiness on  feet (R26.81);Other abnormalities of gait and mobility (R26.89);Muscle weakness (generalized) (M62.81);History of falling (Z91.81);Other symptoms and signs involving cognitive function;Other symptoms and signs involving the nervous system (R29.898);Cognitive communication deficit (R41.841);Hemiplegia and hemiparesis;Pain                Time: 1108-1130 OT Time Calculation (min): 22 min Charges:  OT General Charges $OT Visit: 1 Visit OT Evaluation $OT Eval Moderate Complexity: 1 Mod  Raynald Kemp, OT Acute Rehabilitation Services Office: [phone]   Pilar Grammes 12/28/2022, 12:05 PM

## 2022-12-28 NOTE — TOC Initial Note (Signed)
Transition of Care [facility]) - Initial/Assessment Note    Patient Details  Name: Fernando Hess MRN: [id number] Date of Birth: 09/07/1946  [facility] CM/SW Contact:    Delilah Shan, LCSWA Phone Number: 12/28/2022, 11:55 AM  Clinical Narrative:                  CSW received consult for possible SNF placement at time of discharge. Due to patients current orientation CSW spoke with patients spouse Carney Bern regarding PT recommendation of SNF placement at time of discharge. Carney Bern reports PTA patient comes from home with her.Patients spouse expressed understanding of PT recommendation and is agreeable to SNF placement for patient at time of discharge. Patients spouse  reports preference for [employer] .Patients spouse gave CSW permission to fax out initial referral near the [facility] and [place] area. CSW discussed insurance authorization process and will provide Medicare SNF ratings list with accepted SNF bed offers when available.  No further questions reported at this time. CSW to continue to follow and assist with discharge planning needs.   Expected Discharge Plan: Skilled Nursing Facility Barriers to Discharge: Continued Medical Work up   Patient Goals and CMS Choice   CMS Medicare.gov Compare Post Acute Care list provided to:: Patient Represenative (must comment) (Patients spouse) Choice offered to / list presented to : Spouse      Expected Discharge Plan and Services In-house Referral: Clinical Social Work     Living arrangements for the past 2 months: Single Family Home                                      Prior Living Arrangements/Services Living arrangements for the past 2 months: Single Family Home Lives with:: Spouse Patient language and need for interpreter reviewed:: Yes        Need for Family Participation in Patient Care: Yes (Comment) Care giver support system in place?: Yes (comment)   Criminal Activity/Legal Involvement Pertinent to Current  Situation/Hospitalization: No - Comment as needed  Activities of Daily Living      Permission Sought/Granted Permission sought to share information with : Case Manager, Family Supports, Magazine features editor Permission granted to share information with : No  Share Information with NAME: Due to patients current orientation CSW spoke with patients spouse Carney Bern  Permission granted to share info w AGENCY: Due to patients current orientation CSW spoke with patients spouse Jean/SNF  Permission granted to share info w Relationship: Due to patients current orientation CSW spoke with patients spouse Carney Bern  Permission granted to share info w Contact Information: Due to patients current orientation CSW spoke with patients spouse Carney Bern [phone]  Emotional Assessment       Orientation: : Oriented to Self, Oriented to Place Alcohol / Substance Use: Not Applicable Psych Involvement: No (comment)  Admission diagnosis:  Fracture of femoral neck, right, closed [S72.001A] Closed fracture of right hip, initial encounter [S72.001A] Patient Active Problem List   Diagnosis Date Noted   Fracture of femoral neck, right, closed 12/24/2022   Depression with anxiety 12/24/2022   Acute metabolic encephalopathy 09/18/2022   Acute encephalopathy 09/17/2022   AKI (acute kidney injury) 09/04/2022   Generalized weakness 08/12/2022   Myasthenia gravis 04/21/2022   Chronic kidney disease, stage 3b 03/21/2022   Posterior vitreous detachment of both eyes 01/23/2022   Constipation 11/13/2021   Diabetes mellitus, type 2 11/13/2021   Severe major depression, single  episode, without psychotic features 11/13/2021   Amaurosis fugax 11/13/2021   Amnesia 11/13/2021   Anxiety 11/13/2021   Cardiac arrhythmia 11/13/2021   Hearing loss 11/13/2021   Hypercoagulable state 11/13/2021   Hyperparathyroidism due to renal insufficiency 11/13/2021   Mild aortic stenosis 11/05/2021   Paroxysmal atrial fibrillation  11/05/2021   Nasal septal deviation 08/30/2021   Anticoagulated by anticoagulation treatment 07/16/2021   Syncope 05/10/2021   Gastro-esophageal reflux disease without esophagitis 07/25/2020   Globus pharyngeus 07/25/2020   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 12/21/2019   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   Chronic heart failure with preserved ejection fraction (HFpEF) 11/18/2019   OSA on CPAP 03/07/2019   Peripheral artery disease    Intractable vascular headache 11/11/2018   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   Coronary artery disease involving native coronary artery of native heart without angina pectoris 09/24/2015   Moderate nonproliferative diabetic retinopathy of left eye with macular edema associated with type 2 diabetes mellitus 03/01/2008   Hyperlipidemia LDL goal <70    Essential hypertension    History of kidney stones    PCP:  [name], MD Pharmacy:   [facility] - [place], [name] - [address] [name] [address] Phone: [phone] Fax: [phone]  [facility] [address] [name] [address] Phone: [phone] Fax: [phone]     Social Determinants of Health (SDOH) Social History: SDOH Screenings   Food Insecurity: No Food Insecurity (09/18/2022)  Housing: Low Risk  (09/18/2022)  Transportation Needs: No Transportation Needs (09/18/2022)  Utilities: Not At Risk (09/18/2022)  Depression (PHQ2-9): Low Risk  (05/20/2019)  Tobacco Use: Low Risk  (12/26/2022)   SDOH Interventions:     Readmission Risk Interventions     No data to display

## 2022-12-29 ENCOUNTER — Inpatient Hospital Stay (HOSPITAL_COMMUNITY): Payer: Medicare Other

## 2022-12-29 ENCOUNTER — Other Ambulatory Visit (HOSPITAL_COMMUNITY): Payer: Medicare Other

## 2022-12-29 DIAGNOSIS — S72001A Fracture of unspecified part of neck of right femur, initial encounter for closed fracture: Secondary | ICD-10-CM | POA: Diagnosis not present

## 2022-12-29 LAB — COMPREHENSIVE METABOLIC PANEL
ALT: 19 U/L (ref 0–44)
AST: 33 U/L (ref 15–41)
Albumin: 2.2 g/dL — ABNORMAL LOW (ref 3.5–5.0)
Alkaline Phosphatase: 83 U/L (ref 38–126)
Anion gap: 12 (ref 5–15)
BUN: 31 mg/dL — ABNORMAL HIGH (ref 8–23)
CO2: 24 mmol/L (ref 22–32)
Calcium: 8.6 mg/dL — ABNORMAL LOW (ref 8.9–10.3)
Chloride: 102 mmol/L (ref 98–111)
Creatinine, Ser: 2.09 mg/dL — ABNORMAL HIGH (ref 0.61–1.24)
GFR, Estimated: 32 mL/min — ABNORMAL LOW (ref >60)
Glucose, Bld: 208 mg/dL — ABNORMAL HIGH (ref 70–99)
Potassium: 4 mmol/L (ref 3.5–5.1)
Sodium: 138 mmol/L (ref 135–145)
Total Bilirubin: 1.1 mg/dL (ref 0.3–1.2)
Total Protein: 5.3 g/dL — ABNORMAL LOW (ref 6.5–8.1)

## 2022-12-29 LAB — IRON AND TIBC
Iron: 7 ug/dL — ABNORMAL LOW (ref 45–182)
Saturation Ratios: 3 % — ABNORMAL LOW (ref 17.9–39.5)
TIBC: 231 ug/dL — ABNORMAL LOW (ref 250–450)
UIBC: 224 ug/dL

## 2022-12-29 LAB — GLUCOSE, CAPILLARY
Glucose-Capillary: 190 mg/dL — ABNORMAL HIGH (ref 70–99)
Glucose-Capillary: 367 mg/dL — ABNORMAL HIGH (ref 70–99)
Glucose-Capillary: 235 mg/dL — ABNORMAL HIGH (ref 70–99)
Glucose-Capillary: 212 mg/dL — ABNORMAL HIGH (ref 70–99)

## 2022-12-29 LAB — CBC
HCT: 36.9 % — ABNORMAL LOW (ref 39.0–52.0)
Hemoglobin: 12.5 g/dL — ABNORMAL LOW (ref 13.0–17.0)
MCH: 29.7 pg (ref 26.0–34.0)
MCHC: 33.9 g/dL (ref 30.0–36.0)
MCV: 87.6 fL (ref 80.0–100.0)
Platelets: 154 10*3/uL (ref 150–400)
RBC: 4.21 MIL/uL — ABNORMAL LOW (ref 4.22–5.81)
RDW: 14.5 % (ref 11.5–15.5)
WBC: 9.3 10*3/uL (ref 4.0–10.5)
nRBC: 0 % (ref 0.0–0.2)

## 2022-12-29 LAB — BRAIN NATRIURETIC PEPTIDE: B Natriuretic Peptide: 374 pg/mL — ABNORMAL HIGH (ref 0.0–100.0)

## 2022-12-29 LAB — MAGNESIUM: Magnesium: 1.8 mg/dL (ref 1.7–2.4)

## 2022-12-29 MED ORDER — TECHNETIUM TO 99M ALBUMIN AGGREGATED
3.7000 | Freq: Once | INTRAVENOUS | Status: AC | PRN
  Administered 2022-12-29: 3.7 via INTRAVENOUS

## 2022-12-29 NOTE — Inpatient Diabetes Management (Signed)
Inpatient Diabetes Program Recommendations  AACE/ADA: New Consensus Statement on Inpatient Glycemic Control (2015)  Target Ranges:  Prepandial:   less than 140 mg/dL      Peak postprandial:   less than 180 mg/dL (1-2 hours)      Critically ill patients:  140 - 180 mg/dL   Lab Results  Component Value Date   GLUCAP 367 (H) 12/29/2022   HGBA1C 10.7 (H) 09/18/2022    Latest Reference Range & Units 12/28/22 07:56 12/28/22 11:39 12/28/22 16:28 12/28/22 21:10 12/29/22 07:16 12/29/22 11:26  Glucose-Capillary 70 - 99 mg/dL 665 (H) 993 (H) 570 (H) 153 (H) 190 (H) 367 (H)  (H): Data is abnormally high  Diabetes history: DM2 Outpatient Diabetes medications: Basaglar 40 units QAM, Humalog 8 units TID  Current orders for Inpatient glycemic control: Semglee 30 units QD, Novolog 0-15 units TID, 0-5 units hs  Inpatient Diabetes Program Recommendations:   Noon CBG 367. If postprandial CBGs >180, please consider: -Add Novolog 3 units tid meal coverage if eats 50% or greater meal  Thank you, Darel Hong E. Mussa Groesbeck, RN, MSN, CDE  Diabetes Coordinator Inpatient Glycemic Control Team Team Pager [phone] (8am-5pm) 12/29/2022 12:28 PM

## 2022-12-29 NOTE — Progress Notes (Signed)
PROGRESS NOTE  Fernando Hess  DOB: [birth year]  PCP: [name], MD ZOX:[id number]  DOA: 12/24/2022  LOS: 5 days  Hospital Day: 6  Brief narrative: Fernando Hess is a 77 y.o. male with PMH significant for DM2, HTN, HLD, OSA not on CPAP, CAD s/p CABG, PAF on Eliquis, chronic diastolic CHF,CKD stage IIIb, depression/anxiety, cognitive impairment who lives at home with his spouse, normally ambulates with a walker or cane. 4/10, patient was brought to the ED from home after a fall while transferring from chair to walker, impacting right hip leading to immediate pain.  No head injury or loss of consciousness. Patient reports occasional lightheadedness when standing but it did not occur this time. Last dose of Eliquis on the morning of 4/10  In the ED, hemodynamically stable Labs with creatinine 1.88, glucose 259, hemoglobin 13.3 Right hip x-ray showed an acute impacted subcapital right femoral neck fracture. Chest x-ray showed elevated right hemidiaphragm, right basilar atelectasis, prior sternotomy and CABG changes. Patient was given IV pain medicines Underwent surgical intervention Admitted to [facility]  Subjective: Patient was seen and examined this morning .  No events reported overnight.  Patient still continues to be in need of oxygen support.  VQ scan yesterday was negative for any perfusion defect suggestive of pulmonary embolism.    Assessment and plan: Acute impacted subcapital right femoral neck fracture Secondary to a mechanical fall  Oral and IV opioids as needed for pain management Resume eliquis  Acute respiratory failure with hypoxia 4/11, patient was dyspneic and required supplemental oxygen. X-ray with hyperinflation of both lungs and mild bibasilar atelectasis. Patient was started on 3 L oxygen by nasal cannula. Will plan to wean O2 today VQ scan is negative for any perfusion defect to suggest pulmonary embolism.  Chest x-ray is negative for any acute  cardiopulmonary pathology, BNP is only in the range of 300s . Patient does admit that he has been short of breath for a long period of time only that he has not sought any medical attention.  Given hyperinflated lung suspect undiagnosed COPD. Will get CT chest without contrast.  AKI on CKD 3B Baseline creatinine 1.9 from January.  4/11, Cr stable Creatinine continues to trend down.  Recent Labs    09/17/22 1201 09/18/22 0023 09/20/22 0932 10/12/22 0125 10/12/22 0143 12/24/22 1612 12/25/22 0126 12/26/22 0847 12/27/22 1154 12/29/22 0203  BUN 30* 35* 19 23 39* 42* 31*  CREATININE 1.99*  1.90* 2.01* 1.80* 1.94* 1.90* 1.88* 2.19* 2.61* 2.26* 2.09*    Chronic diastolic CHF Volume status stable Most recent echo from 2022 with EF 60 to 65%.   PTA, not on beta-blocker due to history of bradycardia.  Not on diuretics/ACE/ARB/spironolactone because of CKD. Pro Bnp is only in the range of 300s.  Paroxysmal atrial fibrillation Currently in normal sinus rhythm.  PTA not on any AV nodal blocking agent due to history of bradycardia Chronically anticoagulated on Eliquis 5 mg twice daily.  CAD s/p CABG HLD  Denies anginal symptoms.  Eliquis plan as above Continue Lipitor  UTI- plan to continue abx for 3 days with CTX   Type 2 diabetes mellitus uncontrolled with hyperglycemia A1c 10.7 in 2024 PTA on Basaglar 40 units morning, Humalog 3 times daily Currently on Semglee 30 units daily and sliding scale insulin Accu-Cheks. Recent Labs  Lab 12/28/22 1139 12/28/22 1628 12/28/22 2110 12/29/22 0716 12/29/22 1126  GLUCAP 176* 145* 153* 190* 367*  Depression with anxiety Continue Cymbalta, Bupropion, and Seroquel.   Mobility: Needs PT eval postprocedure  Goals of care   Code Status: Full Code     DVT prophylaxis: Plan to resume Eliquis after procedure SCDs Start: 12/24/22 1856 apixaban (ELIQUIS) tablet 5 mg   Antimicrobials: none Fluid: Not  currently Consultants: Orthopedics Family Communication: Family not at bedside  Status: Inpatient Level of care:  Med-Surg   Needs to continue in-hospital care:  Surgery today.  Patient from: Home Anticipated d/c to: Pending surgery and postop clinical course    Diet:  Diet Order             Diet Carb Modified Fluid consistency: Thin; Room service appropriate? Yes  Diet effective now                   Scheduled Meds:  apixaban  5 mg Oral BID   atorvastatin  80 mg Oral QPM   buPROPion  150 mg Oral q morning   Chlorhexidine Gluconate Cloth  6 each Topical Daily   DULoxetine  90 mg Oral q morning   feeding supplement (GLUCERNA SHAKE)  237 mL Oral TID BM   insulin aspart  0-15 Units Subcutaneous TID WC   insulin aspart  0-5 Units Subcutaneous QHS   insulin glargine-yfgn  30 Units Subcutaneous Daily   multivitamin with minerals  1 tablet Oral Daily   mupirocin ointment  1 Application Nasal BID   pantoprazole  40 mg Oral q AM   QUEtiapine  50 mg Oral Daily    PRN meds: HYDROcodone-acetaminophen, ipratropium, senna-docusate   Infusions:   cefTRIAXone (ROCEPHIN)  IV 1 g (12/29/22 0552)    Antimicrobials: Anti-infectives (From admission, onward)    Start     Dose/Rate Route Frequency Ordered Stop   12/27/22 0800  ceFAZolin (ANCEF) IVPB 2g/100 mL premix        2 g 200 mL/hr over 30 Minutes Intravenous To [facility] 12/26/22 1059 12/26/22 1410   12/26/22 1107  ceFAZolin (ANCEF) 2-4 GM/100ML-% IVPB       Note to Pharmacy: [facility], [place]: cabinet override      12/26/22 1107 12/26/22 1400   12/26/22 0600  cefTRIAXone (ROCEPHIN) 1 g in sodium chloride 0.9 % 100 mL IVPB        1 g 200 mL/hr over 30 Minutes Intravenous Every 24 hours 12/26/22 0520 01/02/23 0559       Nutritional status:  Body mass index is 24.55 kg/m.  Nutrition Problem: Increased nutrient needs Etiology: hip fracture, post-op healing Signs/Symptoms: estimated  needs     Objective: Vitals:   12/28/22 2030 12/29/22 0725  BP: 139/89 112/68  Pulse: 72 97  Resp:  18  Temp:  98.4 F (36.9 C)  SpO2:  100%    Intake/Output Summary (Last 24 hours) at 12/29/2022 1305 Last data filed at 12/29/2022 0825 Gross per 24 hour  Intake 200 ml  Output 3750 ml  Net -3550 ml    Filed Weights   12/24/22 1444 12/26/22 1117  Weight: 68 kg 69 kg   Weight change:  Body mass index is 24.55 kg/m.   Physical Exam: General exam: Pleasant, elderly Caucasian male.  Not in distress. Skin: No rashes, lesions or ulcers. HEENT: Atraumatic, normocephalic, no obvious bleeding Lungs: Clear to auscultation bilaterally,  CVS: regular rate and rhythm, no murmur GI/Abd Soft, nontender, nondistended, bowel sound present CNS: alert, awake, oriented times X 1. No focal deficits on exam. 5/5 strength in upper extermities.  Sensation intact.  Psychiatry: Mood appropriate Extremities: No pedal edema, no calf tenderness  Data Review: I have personally reviewed the laboratory data and studies available.  F/u labs ordered Unresulted Labs (From admission, onward)     Start     Ordered   12/26/22 0956  Urinalysis, Routine w reflex microscopic -Urine, Clean Catch  Add-on,   AD       Question:  Specimen Source  Answer:  Urine, Clean Catch   12/26/22 0956            Total time spent in review of labs and imaging, patient evaluation, formulation of plan, documentation and communication with family: 45 minutes  Signed, Harold Hedge, MD [facility] 12/29/2022

## 2022-12-29 NOTE — Care Management Important Message (Signed)
Important Message  Patient Details  Name: Fernando Hess MRN: [id number] Date of Birth: Jul 01, 1946   Medicare Important Message Given:  Yes     Sherilyn Banker 12/29/2022, 11:46 AM

## 2022-12-29 NOTE — Progress Notes (Signed)
Physical Therapy Treatment Patient Details Name: Fernando Hess MRN: [id number] DOB: 1946/04/08 Today's Date: 12/29/2022   History of Present Illness 77 y.o. male admitted 4/10 after a fall resulting in  Right displaced femoral neck fracture, s/p Right hip hemiarthroplasty on 4/12. PMH significant for DM2, HTN, HLD, OSA not on CPAP, CAD s/p CABG, PAF on Eliquis, chronic diastolic CHF,CKD stage IIIb, depression/anxiety, cognitive impairment.    PT Comments    Pt was received in supine and agreeable to session. Pt continuing to require max A for bed mobility due to weakness and difficulty sequencing. Pt requiring assist once sitting EOB to reposition BLE due to pt demonstrating L lateral lean and inability to position hips underneath him to maintain balance. Pt unable to reach an upright posture with RW due to his feet sliding forward during stand and BLE weakness. Pt able to stand with stedy and max A +2 with difficulty due to BLE weakness and shaking. Pt continues to benefit from PT services to progress toward functional mobility goals.     Recommendations for follow up therapy are one component of a multi-disciplinary discharge planning process, led by the attending physician.  Recommendations may be updated based on patient status, additional functional criteria and insurance authorization.  Follow Up Recommendations  Can patient physically be transported by private vehicle: No    Assistance Recommended at Discharge Frequent or constant Supervision/Assistance  Patient can return home with the following Two people to help with walking and/or transfers;Two people to help with bathing/dressing/bathroom;Assistance with cooking/housework;Direct supervision/assist for medications management;Direct supervision/assist for financial management;Assist for transportation;Help with stairs or ramp for entrance   Equipment Recommendations  None recommended by PT    Recommendations for Other Services        Precautions / Restrictions Precautions Precautions: Fall Restrictions Weight Bearing Restrictions: Yes RLE Weight Bearing: Weight bearing as tolerated     Mobility  Bed Mobility Overal bed mobility: Needs Assistance Bed Mobility: Supine to Sit     Supine to sit: Max assist     General bed mobility comments: Max A for trunk elevation and repositioning BLEs at EOB to maintain balance. Cues for anterior weight shift with improvement in sitting balance.    Transfers Overall transfer level: Needs assistance Equipment used: Rolling walker (2 wheels), Ambulation equipment used Transfers: Sit to/from Stand, Bed to chair/wheelchair/BSC Sit to Stand: Max assist, +2 physical assistance           General transfer comment: Attempted x2 from EOB to RW with pt's feet sliding forward requiring blocking. Pt very shaky and unable to reach upright posture. Pt able to stand from EOB to stedy with max +2 with use of bedpad to reach full upright posture. Use of stedy to transfer to chair with min A to stand from stedy paddles. Transfer via Lift Equipment: Stedy  Ambulation/Gait               General Gait Details: unable      Balance Overall balance assessment: Needs assistance, History of Falls Sitting-balance support: Feet supported, Bilateral upper extremity supported Sitting balance-Leahy Scale: Poor Sitting balance - Comments: sitting EOB with min guard and cues for anterior weight shift and assist with BLE placement   Standing balance support: Bilateral upper extremity supported, Reliant on assistive device for balance Standing balance-Leahy Scale: Zero Standing balance comment: with stedy support  Cognition Arousal/Alertness: Awake/alert Behavior During Therapy: WFL for tasks assessed/performed Overall Cognitive Status: Difficult to assess                                 General Comments: Pt difficulty to  understand at times, but able to follow all commands and cues.        Exercises      General Comments        Pertinent Vitals/Pain Pain Assessment Pain Assessment: Faces Faces Pain Scale: Hurts even more Pain Location: Rt hip Pain Descriptors / Indicators: Aching, Grimacing, Guarding Pain Intervention(s): Limited activity within patient's tolerance, Monitored during session, Repositioned     PT Goals (current goals can now be found in the care plan section) Acute Rehab PT Goals Patient Stated Goal: None stated PT Goal Formulation: Patient unable to participate in goal setting Time For Goal Achievement: 01/10/23 Potential to Achieve Goals: Fair Progress towards PT goals: Progressing toward goals    Frequency    Min 3X/week      PT Plan Current plan remains appropriate       AM-PAC PT "6 Clicks" Mobility   Outcome Measure  Help needed turning from your back to your side while in a flat bed without using bedrails?: A Lot Help needed moving from lying on your back to sitting on the side of a flat bed without using bedrails?: A Lot Help needed moving to and from a bed to a chair (including a wheelchair)?: Total Help needed standing up from a chair using your arms (e.g., wheelchair or bedside chair)?: Total Help needed to walk in hospital room?: Total Help needed climbing 3-5 steps with a railing? : Total 6 Click Score: 8    End of Session Equipment Utilized During Treatment: Gait belt Activity Tolerance: Patient limited by fatigue Patient left: in chair;with call bell/phone within reach;with chair alarm set Nurse Communication: Mobility status;Need for lift equipment PT Visit Diagnosis: Unsteadiness on feet (R26.81);Repeated falls (R29.6);Muscle weakness (generalized) (M62.81);History of falling (Z91.81);Difficulty in walking, not elsewhere classified (R26.2);Other symptoms and signs involving the nervous system (R29.898);Pain     Time: 0981-1914 PT Time  Calculation (min) (ACUTE ONLY): 26 min  Charges:  $Therapeutic Activity: 23-37 mins                     Johny Shock, PTA Acute Rehabilitation Services Secure Chat Preferred  [phone]    Johny Shock 12/29/2022, 1:08 PM

## 2022-12-29 NOTE — Progress Notes (Signed)
Pt. Off unit to CT scan.

## 2022-12-29 NOTE — TOC Progression Note (Addendum)
Transition of Care [facility]) - Progression Note    Patient Details  Name: Fernando Hess MRN: [id number] Date of Birth: Jan 09, 1946  Transition of Care [facility]) CM/SW Contact  Lorri Frederick, LCSW Phone Number: 12/29/2022, 2:40 PM  Clinical Narrative:   CSW spoke to pt wife on the phone, provided bed offers.  She is asking for responses from [facility], [facility], and [facility]. CSW reached out to those facilities.  1130: None of the three can offer bed.  Wife updated.  She is asking if we can "wait a few days and see if something opens up."  She also wants to discuss with friend. 1200:  TC wife Carney Bern.  Her friend is Charity fundraiser, coming to the hospital to see pt and talk to CSW.    1300: TC  home care RN, did not get name, her agency is active with family currently.  She is going to recommend to pt wife to accept offer at [facility].  1440: TC wife, still asking if we can wait for something to open up.  Discussed other higher rated SNF if she is willing for pt to be outside [place], discussed [facility] [facility], [facility].  CSW will send referral to those facilities.      Expected Discharge Plan: Skilled Nursing Facility Barriers to Discharge: Continued Medical Work up  Expected Discharge Plan and Services In-house Referral: Clinical Social Work     Living arrangements for the past 2 months: Single Family Home                                       Social Determinants of Health (SDOH) Interventions SDOH Screenings   Food Insecurity: No Food Insecurity (09/18/2022)  Housing: Low Risk  (09/18/2022)  Transportation Needs: No Transportation Needs (09/18/2022)  Utilities: Not At Risk (09/18/2022)  Depression (PHQ2-9): Low Risk  (05/20/2019)  Tobacco Use: Low Risk  (12/26/2022)    Readmission Risk Interventions     No data to display

## 2022-12-29 NOTE — TOC Progression Note (Addendum)
Transition of Care [facility]) - Progression Note    Patient Details  Name: Fernando Hess MRN: [id number] Date of Birth: 1946-06-16  Transition of Care [facility]) CM/SW Contact  Lorri Frederick, LCSW Phone Number: 12/29/2022, 10:41 AM  Clinical Narrative:    CSW reached out to [facility].  They are unable to offer bed.  CSW spoke with pt wife Fernando Hess and discussed current bed offers, discussed [facility] and [facility] cannot offer.  She is requesting responses from higher rated SNFs.  CSW reached out to [facility], [facility], and [facility].   1130: none of those SNFs can offer a bed. CSW reached out to [facility], [place] out today, someone in medical records is covering, CSW LM  Wife informed.  She reports that MD yesterday told her pt could remain in the hospital until an acceptable SNF offer came.  Wife has a friend who is an Charity fundraiser who is at the hospital now, will be at pt room shortly, would like to discuss.  CSW provided phone number for when she arrives.    Expected Discharge Plan: Skilled Nursing Facility Barriers to Discharge: Continued Medical Work up  Expected Discharge Plan and Services In-house Referral: Clinical Social Work     Living arrangements for the past 2 months: Single Family Home                                       Social Determinants of Health (SDOH) Interventions SDOH Screenings   Food Insecurity: No Food Insecurity (09/18/2022)  Housing: Low Risk  (09/18/2022)  Transportation Needs: No Transportation Needs (09/18/2022)  Utilities: Not At Risk (09/18/2022)  Depression (PHQ2-9): Low Risk  (05/20/2019)  Tobacco Use: Low Risk  (12/26/2022)    Readmission Risk Interventions     No data to display

## 2022-12-30 ENCOUNTER — Inpatient Hospital Stay (HOSPITAL_COMMUNITY): Payer: Medicare Other

## 2022-12-30 ENCOUNTER — Encounter (HOSPITAL_COMMUNITY): Payer: Self-pay | Admitting: Orthopedic Surgery

## 2022-12-30 DIAGNOSIS — R0609 Other forms of dyspnea: Secondary | ICD-10-CM | POA: Diagnosis not present

## 2022-12-30 DIAGNOSIS — S72001D Fracture of unspecified part of neck of right femur, subsequent encounter for closed fracture with routine healing: Secondary | ICD-10-CM | POA: Diagnosis not present

## 2022-12-30 LAB — ECHOCARDIOGRAM COMPLETE
AR max vel: 1.44 cm2
AV Area VTI: 1.4 cm2
AV Area mean vel: 1.35 cm2
AV Mean grad: 11 mmHg
AV Peak grad: 20.3 mmHg
Ao pk vel: 2.25 m/s
Area-P 1/2: 4.41 cm2
Height: 66 in
S' Lateral: 2.8 cm
Weight: 2433.88 oz

## 2022-12-30 LAB — GLUCOSE, CAPILLARY
Glucose-Capillary: 159 mg/dL — ABNORMAL HIGH (ref 70–99)
Glucose-Capillary: 274 mg/dL — ABNORMAL HIGH (ref 70–99)
Glucose-Capillary: 127 mg/dL — ABNORMAL HIGH (ref 70–99)
Glucose-Capillary: 184 mg/dL — ABNORMAL HIGH (ref 70–99)

## 2022-12-30 MED ORDER — IPRATROPIUM BROMIDE 0.02 % IN SOLN
0.5000 mg | Freq: Four times a day (QID) | RESPIRATORY_TRACT | Status: DC
  Filled 2022-12-30: qty 2.5

## 2022-12-30 MED ORDER — IPRATROPIUM-ALBUTEROL 0.5-2.5 (3) MG/3ML IN SOLN
3.0000 mL | Freq: Four times a day (QID) | RESPIRATORY_TRACT | Status: AC
  Administered 2022-12-30 – 2023-01-01 (×6): 3 mL via RESPIRATORY_TRACT
  Filled 2022-12-30 (×7): qty 3

## 2022-12-30 MED ORDER — SODIUM CHLORIDE 0.9 % IV SOLN
250.0000 mg | Freq: Every day | INTRAVENOUS | Status: DC
  Administered 2022-12-30 – 2023-01-01 (×3): 250 mg via INTRAVENOUS
  Filled 2022-12-30 (×3): qty 20

## 2022-12-30 MED ORDER — BUDESONIDE 0.25 MG/2ML IN SUSP
0.2500 mg | Freq: Two times a day (BID) | RESPIRATORY_TRACT | Status: DC
  Administered 2022-12-31 (×2): 0.25 mg via RESPIRATORY_TRACT
  Filled 2022-12-30 (×4): qty 2

## 2022-12-30 MED ORDER — PERFLUTREN LIPID MICROSPHERE
1.0000 mL | INTRAVENOUS | Status: AC | PRN
  Administered 2022-12-30: 4 mL via INTRAVENOUS

## 2022-12-30 NOTE — Progress Notes (Signed)
OT Cancellation Note  Patient Details Name: Fernando Hess MRN: [id number] DOB: 1946/08/24   Cancelled Treatment:    Reason Eval/Treat Not Completed: Other (comment). Pt on bed pan at this time. OT will follow up next available day/time  Galen Manila 12/30/2022, 2:55 PM

## 2022-12-30 NOTE — Progress Notes (Signed)
Occupational Therapy Treatment Patient Details Name: Fernando Hess MRN: [id number] DOB: 01-Mar-1946 Today's Date: 12/30/2022   History of present illness 77 y.o. male admitted 4/10 after a fall resulting in  Right displaced femoral neck fracture, s/p Right hip hemiarthroplasty on 4/12. PMH significant for DM2, HTN, HLD, OSA not on CPAP, CAD s/p CABG, PAF on Eliquis, chronic diastolic CHF,CKD stage IIIb, depression/anxiety, cognitive impairment.   OT comments  Patient with fair progress toward patient focused goals.  Patient able to perform side lying with supervision, supine to sit with Mod A and stood with Mod A of one at RW level.  Patient with assist was able to take two small side steps along the bed before needing to sit.  Max A for supine to assist legs.  Patient setup for supper and eating upon OT exit.  Patient demonstrated better edge of bed sitting, needing only supervision, and was able to wash his hands and face seated.  OT will continue efforts in the acute setting to address deficits, and Patient will benefit from continued inpatient follow up therapy, <3 hours/day    Recommendations for follow up therapy are one component of a multi-disciplinary discharge planning process, led by the attending physician.  Recommendations may be updated based on patient status, additional functional criteria and insurance authorization.    Assistance Recommended at Discharge Frequent or constant Supervision/Assistance  Patient can return home with the following  A lot of help with walking and/or transfers;A lot of help with bathing/dressing/bathroom   Equipment Recommendations       Recommendations for Other Services      Precautions / Restrictions Precautions Precautions: Fall Restrictions Weight Bearing Restrictions: Yes RLE Weight Bearing: Weight bearing as tolerated       Mobility Bed Mobility   Bed Mobility: Supine to Sit, Sit to Supine     Supine to sit: Min assist, Mod  assist Sit to supine: Mod assist, Max assist        Transfers Overall transfer level: Needs assistance Equipment used: Rolling walker (2 wheels) Transfers: Sit to/from Stand Sit to Stand: Mod assist                 Balance Overall balance assessment: Needs assistance Sitting-balance support: Feet supported, Bilateral upper extremity supported Sitting balance-Leahy Scale: Fair     Standing balance support: Reliant on assistive device for balance Standing balance-Leahy Scale: Poor                             ADL either performed or assessed with clinical judgement   ADL       Grooming: Min guard;Sitting           Upper Body Dressing : Minimal assistance;Sitting   Lower Body Dressing: Maximal assistance;Sitting/lateral leans                        Cognition Arousal/Alertness: Awake/alert Behavior During Therapy: WFL for tasks assessed/performed Overall Cognitive Status: No family/caregiver present to determine baseline cognitive functioning                                          Exercises      Shoulder Instructions       General Comments  VSS on RA    Pertinent Vitals/ Pain  Pain Assessment Pain Assessment: Faces Faces Pain Scale: Hurts little more Pain Location: Rt hip Pain Descriptors / Indicators: Aching, Grimacing, Guarding, Tender Pain Intervention(s): Monitored during session                                                          Frequency  Min 2X/week        Progress Toward Goals  OT Goals(current goals can now be found in the care plan section)  Progress towards OT goals: Progressing toward goals  Acute Rehab OT Goals OT Goal Formulation: With patient Time For Goal Achievement: 01/11/23 Potential to Achieve Goals: Fair  Plan Discharge plan remains appropriate    Co-evaluation                 AM-PAC OT "6 Clicks" Daily Activity     Outcome  Measure   Help from another person eating meals?: A Little Help from another person taking care of personal grooming?: A Little Help from another person toileting, which includes using toliet, bedpan, or urinal?: A Lot Help from another person bathing (including washing, rinsing, drying)?: A Lot Help from another person to put on and taking off regular upper body clothing?: A Little Help from another person to put on and taking off regular lower body clothing?: A Lot 6 Click Score: 15    End of Session Equipment Utilized During Treatment: Gait belt;Rolling walker (2 wheels)  OT Visit Diagnosis: Unsteadiness on feet (R26.81);Other abnormalities of gait and mobility (R26.89);Muscle weakness (generalized) (M62.81);History of falling (Z91.81);Other symptoms and signs involving cognitive function;Other symptoms and signs involving the nervous system (R29.898);Cognitive communication deficit (R41.841);Pain Pain - Right/Left: Right Pain - part of body: Leg   Activity Tolerance Patient tolerated treatment well   Patient Left in bed;with call bell/phone within reach;with bed alarm set   Nurse Communication Mobility status        Time: 1610-9604 OT Time Calculation (min): 21 min  Charges: OT General Charges $OT Visit: 1 Visit OT Treatments $Self Care/Home Management : 8-22 mins  12/30/2022  RP, OTR/L  Acute Rehabilitation Services  Office:  [phone]   Suzanna Obey 12/30/2022, 4:44 PM

## 2022-12-30 NOTE — TOC Progression Note (Addendum)
Transition of Care [facility]) - Progression Note    Patient Details  Name: Fernando Hess MRN: [id number] Date of Birth: 1946/07/05  [facility]) CM/SW Contact  Lorri Frederick, LCSW Phone Number: 12/30/2022, 12:11 PM  Clinical Narrative:    [name], [facility] not. CSW spoke with wife Carney Bern and she wants to accept offer at [facility].  [facility] informed.  1315: Auth request submitted in [place] and approved: [id number], 3 days: 4/17-4/19.  Expected Discharge Plan: Skilled Nursing Facility Barriers to Discharge: Continued Medical Work up  Expected Discharge Plan and Services In-house Referral: Clinical Social Work     Living arrangements for the past 2 months: Single Family Home                                       Social Determinants of Health (SDOH) Interventions SDOH Screenings   Food Insecurity: No Food Insecurity (09/18/2022)  Housing: Low Risk  (09/18/2022)  Transportation Needs: No Transportation Needs (09/18/2022)  Utilities: Not At Risk (09/18/2022)  Depression (PHQ2-9): Low Risk  (05/20/2019)  Tobacco Use: Low Risk  (12/30/2022)    Readmission Risk Interventions     No data to display

## 2022-12-30 NOTE — TOC Progression Note (Signed)
Transition of Care [facility]) - Progression Note    Patient Details  Name: Fernando Hess MRN: [id number] Date of Birth: 01/03/1946  Transition [facility]) CM/SW Contact  Lorri Frederick, LCSW Phone Number: 12/30/2022, 8:34 AM  Clinical Narrative:   PASSR received: [id number]    Expected Discharge Plan: Skilled Nursing Facility Barriers to Discharge: Continued Medical Work up  Expected Discharge Plan and Services In-house Referral: Clinical Social Work     Living arrangements for the past 2 months: Single Family Home                                       Social Determinants of Health (SDOH) Interventions SDOH Screenings   Food Insecurity: No Food Insecurity (09/18/2022)  Housing: Low Risk  (09/18/2022)  Transportation Needs: No Transportation Needs (09/18/2022)  Utilities: Not At Risk (09/18/2022)  Depression (PHQ2-9): Low Risk  (05/20/2019)  Tobacco Use: Low Risk  (12/26/2022)    Readmission Risk Interventions     No data to display

## 2022-12-30 NOTE — Plan of Care (Signed)

## 2022-12-30 NOTE — Progress Notes (Signed)
Echocardiogram 2D Echocardiogram has been performed.  Fernando Hess 12/30/2022, 11:20 AM

## 2022-12-30 NOTE — Progress Notes (Addendum)
PROGRESS NOTE  Fernando Hess  DOB: 12/31/1945  PCP: [name], MD XBJ:[id number]  DOA: 12/24/2022  LOS: 6 days  Hospital Day: 7  Brief narrative: Fernando Hess is a 77 y.o. male with PMH significant for DM2, HTN, HLD, OSA not on CPAP, CAD s/p CABG, PAF on Eliquis, chronic diastolic CHF,CKD stage IIIb, depression/anxiety, cognitive impairment who lives at home with his spouse, normally ambulates with a walker or cane. 4/10, patient was brought to the ED from home after a fall while transferring from chair to walker, impacting right hip leading to immediate pain.  No head injury or loss of consciousness. Patient reports occasional lightheadedness when standing but it did not occur this time. Last dose of Eliquis on the morning of 4/10  In the ED, hemodynamically stable Labs with creatinine 1.88, glucose 259, hemoglobin 13.3 Right hip x-ray showed an acute impacted subcapital right femoral neck fracture. Chest x-ray showed elevated right hemidiaphragm, right basilar atelectasis, prior sternotomy and CABG changes. Patient was given IV pain medicines Underwent surgical intervention Admitted to [facility]  Subjective: Patient was seen and examined this morning .  No events reported overnight.  Patient denies having any shortness of breath or any other complaints.  Assessment and plan: Acute impacted subcapital right femoral neck fracture Secondary to a mechanical fall  Oral and IV opioids as needed for pain management Resume eliquis  Acute respiratory failure with hypoxia 4/11, patient was dyspneic and required supplemental oxygen. X-ray with hyperinflation of both lungs and mild bibasilar atelectasis. Patient was started on 3 L oxygen by nasal cannula. VQ scan is negative for any perfusion defect to suggest pulmonary embolism.  Chest x-ray is negative for any acute cardiopulmonary pathology, BNP is only in the range of 300s . Patient does admit that he has been short of breath for  a long period of time only that he has not sought any medical attention.  Given hyperinflated lung suspect undiagnosed COPD. Will get CT chest without contrast. CT shows Mild diffuse heterogeneity of pulmonary parenchyma, can be seen with small airways disease.  Will treat with DuoNebs, inhalational steroids and wean oxygen.  Currently oxygen need down to 2 L. She will benefit from outpatient pulmonology referral and further workup with PFTs for airway disease.  AKI on CKD 3B Baseline creatinine 1.9 from January.  4/11, Cr stable Creatinine continues to trend down.  Recent Labs    09/17/22 1201 09/18/22 0023 09/20/22 0932 10/12/22 0125 10/12/22 0143 12/24/22 1612 12/25/22 0126 12/26/22 0847 12/27/22 1154 12/29/22 0203  BUN 20  19 23 22  30* 35* 19 23 39* 42* 31*  CREATININE 1.99*  1.90* 2.01* 1.80* 1.94* 1.90* 1.88* 2.19* 2.61* 2.26* 2.09*    Chronic diastolic CHF Volume status stable Most recent echo from 2022 with EF 60 to 65%.   PTA, not on beta-blocker due to history of bradycardia.  Not on diuretics/ACE/ARB/spironolactone because of CKD. Pro Bnp is only in the range of 300s.  Repeated echocardiogram today which shows preserved ejection fraction without any regional wall motion abnormality, normal right atrial pressures.   Paroxysmal atrial fibrillation Currently in normal sinus rhythm.  PTA not on any AV nodal blocking agent due to history of bradycardia Chronically anticoagulated on Eliquis 5 mg twice daily.  CAD s/p CABG HLD  Denies anginal symptoms.  Eliquis plan as above Continue Lipitor  UTI- plan to continue abx for 3 days with CTX   Type 2 diabetes mellitus uncontrolled with hyperglycemia A1c 10.7 in  2024 PTA on Basaglar 40 units morning, Humalog 3 times daily Currently on Semglee 30 units daily and sliding scale insulin Accu-Cheks. Recent Labs  Lab 12/29/22 1126 12/29/22 1632 12/29/22 2020 12/30/22 0722 12/30/22 1129  GLUCAP 367* 235* 212* 159* 274*      Depression with anxiety Continue Cymbalta, Bupropion, and Seroquel.   Mobility: Needs PT eval postprocedure  Goals of care   Code Status: Full Code     DVT prophylaxis: Plan to resume Eliquis after procedure SCDs Start: 12/24/22 1856 apixaban (ELIQUIS) tablet 5 mg   Antimicrobials: none Fluid: Not currently Consultants: Orthopedics Family Communication: Family not at bedside, tried to call the wife Olivia Bishop, did not answer, left a voice message.  Status: Inpatient Level of care:  Med-Surg   Needs to continue in-hospital care:  Surgery today.  Patient from: Home Anticipated d/c to: Pending surgery and postop clinical course    Diet:  Diet Order             Diet Carb Modified Fluid consistency: Thin; Room service appropriate? Yes  Diet effective now                   Scheduled Meds:  apixaban  5 mg Oral BID   atorvastatin  80 mg Oral QPM   buPROPion  150 mg Oral q morning   Chlorhexidine Gluconate Cloth  6 each Topical Daily   DULoxetine  90 mg Oral q morning   feeding supplement (GLUCERNA SHAKE)  237 mL Oral TID BM   insulin aspart  0-15 Units Subcutaneous TID WC   insulin aspart  0-5 Units Subcutaneous QHS   insulin glargine-yfgn  30 Units Subcutaneous Daily   multivitamin with minerals  1 tablet Oral Daily   mupirocin ointment  1 Application Nasal BID   pantoprazole  40 mg Oral q AM   QUEtiapine  50 mg Oral Daily    PRN meds: HYDROcodone-acetaminophen, senna-docusate   Infusions:   cefTRIAXone (ROCEPHIN)  IV 1 g (12/30/22 0604)   ferric gluconate (FERRLECIT) IVPB 250 mg (12/30/22 1206)    Antimicrobials: Anti-infectives (From admission, onward)    Start     Dose/Rate Route Frequency Ordered Stop   12/27/22 0800  ceFAZolin (ANCEF) IVPB 2g/100 mL premix        2 g 200 mL/hr over 30 Minutes Intravenous To ShortStay Surgical 12/26/22 1059 12/26/22 1410   12/26/22 1107  ceFAZolin (ANCEF) 2-4 GM/100ML-% IVPB       Note to Pharmacy:  [facility], [place]: cabinet override      12/26/22 1107 12/26/22 1400   12/26/22 0600  cefTRIAXone (ROCEPHIN) 1 g in sodium chloride 0.9 % 100 mL IVPB        1 g 200 mL/hr over 30 Minutes Intravenous Every 24 hours 12/26/22 0520 01/02/23 0559       Nutritional status:  Body mass index is 24.55 kg/m.  Nutrition Problem: Increased nutrient needs Etiology: hip fracture, post-op healing Signs/Symptoms: estimated needs     Objective: Vitals:   12/30/22 0730 12/30/22 1454  BP: 126/79 130/88  Pulse: 80 100  Resp: 17 17  Temp: 97.9 F (36.6 C) 98.2 F (36.8 C)  SpO2: 97% 97%    Intake/Output Summary (Last 24 hours) at 12/30/2022 1539 Last data filed at 12/30/2022 1500 Gross per 24 hour  Intake 730 ml  Output 700 ml  Net 30 ml    Filed Weights   12/24/22 1444 12/26/22 1117  Weight: 68 kg 69 kg  Weight change:  Body mass index is 24.55 kg/m.   Physical Exam: General exam: Pleasant, elderly Caucasian male.  Not in distress. Skin: No rashes, lesions or ulcers. HEENT: Atraumatic, normocephalic, no obvious bleeding Lungs: Clear to auscultation bilaterally,  CVS: regular rate and rhythm, no murmur GI/Abd Soft, nontender, nondistended, bowel sound present CNS: alert, awake, oriented times X 1. No focal deficits on exam. 5/5 strength in upper extermities. Sensation intact.  Psychiatry: Mood appropriate Extremities: No pedal edema, no calf tenderness  Data Review: I have personally reviewed the laboratory data and studies available.  F/u labs ordered Unresulted Labs (From admission, onward)     Start     Ordered   12/26/22 0956  Urinalysis, Routine w reflex microscopic -Urine, Clean Catch  Add-on,   AD       Question:  Specimen Source  Answer:  Urine, Clean Catch   12/26/22 0956            Total time spent in review of labs and imaging, patient evaluation, formulation of plan, documentation and communication with family: 45 minutes  Signed, Harold Hedge,  MD [facility] 12/30/2022

## 2022-12-31 DIAGNOSIS — S72001D Fracture of unspecified part of neck of right femur, subsequent encounter for closed fracture with routine healing: Secondary | ICD-10-CM | POA: Diagnosis not present

## 2022-12-31 LAB — BASIC METABOLIC PANEL
Anion gap: 7 (ref 5–15)
BUN: 32 mg/dL — ABNORMAL HIGH (ref 8–23)
CO2: 25 mmol/L (ref 22–32)
Calcium: 8.2 mg/dL — ABNORMAL LOW (ref 8.9–10.3)
Chloride: 102 mmol/L (ref 98–111)
Creatinine, Ser: 1.87 mg/dL — ABNORMAL HIGH (ref 0.61–1.24)
GFR, Estimated: 36 mL/min — ABNORMAL LOW (ref >60)
Glucose, Bld: 338 mg/dL — ABNORMAL HIGH (ref 70–99)
Potassium: 3.9 mmol/L (ref 3.5–5.1)
Sodium: 134 mmol/L — ABNORMAL LOW (ref 135–145)

## 2022-12-31 LAB — CBC
HCT: 34.9 % — ABNORMAL LOW (ref 39.0–52.0)
Hemoglobin: 11.6 g/dL — ABNORMAL LOW (ref 13.0–17.0)
MCH: 29.3 pg (ref 26.0–34.0)
MCHC: 33.2 g/dL (ref 30.0–36.0)
MCV: 88.1 fL (ref 80.0–100.0)
Platelets: 208 10*3/uL (ref 150–400)
RBC: 3.96 MIL/uL — ABNORMAL LOW (ref 4.22–5.81)
RDW: 14.4 % (ref 11.5–15.5)
WBC: 9.3 10*3/uL (ref 4.0–10.5)
nRBC: 0 % (ref 0.0–0.2)

## 2022-12-31 LAB — GLUCOSE, CAPILLARY
Glucose-Capillary: 151 mg/dL — ABNORMAL HIGH (ref 70–99)
Glucose-Capillary: 279 mg/dL — ABNORMAL HIGH (ref 70–99)
Glucose-Capillary: 172 mg/dL — ABNORMAL HIGH (ref 70–99)
Glucose-Capillary: 156 mg/dL — ABNORMAL HIGH (ref 70–99)

## 2022-12-31 MED ORDER — TAMSULOSIN HCL 0.4 MG PO CAPS
0.4000 mg | ORAL_CAPSULE | Freq: Every day | ORAL | Status: DC
  Administered 2023-01-01: 0.4 mg via ORAL
  Filled 2022-12-31: qty 1

## 2022-12-31 NOTE — Plan of Care (Signed)
  Problem: Nutrition: Goal: Adequate nutrition will be maintained Outcome: Progressing   Problem: Elimination: Goal: Will not experience complications related to bowel motility Outcome: Progressing Goal: Will not experience complications related to urinary retention Outcome: Progressing   Problem: Coping: Goal: Ability to adjust to condition or change in health will improve Outcome: Not Progressing   Problem: Nutritional: Goal: Maintenance of adequate nutrition will improve Outcome: Not Progressing   Problem: Clinical Measurements: Goal: Respiratory complications will improve Outcome: Not Progressing   Problem: Activity: Goal: Risk for activity intolerance will decrease Outcome: Not Progressing

## 2022-12-31 NOTE — Progress Notes (Signed)
[facility]ernando [facility] PROGRESS NOTE  Fernando Hess  DOB: 1945/10/30  PCP: Fernando Floro, MD UEA:[id number]  DOA: 12/24/2022  LOS: 7 days  Hospital Day: 8  Brief narrative: Fernando Hess is a 77 y.o. male with PMH significant for DM2, HTN, HLD, OSA not on CPAP, CAD s/p CABG, PAF on Eliquis, chronic diastolic CHF,CKD stage IIIb, depression/anxiety, cognitive impairment who lives at home with his spouse, normally ambulates with a walker or cane. 4/10, patient was brought to the ED from home after a fall while transferring from chair to walker, impacting right hip leading to immediate pain.  No head injury or loss of consciousness. Patient reports occasional lightheadedness when standing but it did not occur this time. Last dose of Eliquis on the morning of 4/10  In the ED, hemodynamically stable Labs with creatinine 1.88, glucose 259, hemoglobin 13.3 Right hip x-ray showed an acute impacted subcapital right femoral neck fracture. Chest x-ray showed elevated right hemidiaphragm, right basilar atelectasis, prior sternotomy and CABG changes. Patient was given IV pain medicines EDP spoke with on-call orthopedics Dr. Aundria Hess with [facility], tentatively planned for surgical intervention on Friday after Eliquis washout. Admitted to [facility]  Subjective: Patient was seen and examined this morning. Lying on bed.  Not in distress no new symptoms.  Dyspnea improving.  Assessment and plan: Acute impacted subcapital right femoral neck fracture Secondary to a mechanical fall  Oral and IV opioids as needed for pain management Eliquis resumed.  Acute respiratory failure with hypoxia COPD 4/11, patient was dyspneic and required supplemental oxygen. X-ray with hyperinflation of both lungs and mild bibasilar atelectasis. Shortness of breath was worked up in the last few days.  VQ scan negative for pulm embolism.  CT chest showed mild diffuse heterogenicity of pulmonary parenchyma which can be seen with small airway  disease.  Patient was treated with DuoNeb, inhalational steroid. Patient was started on 3 L oxygen by nasal cannula.  Currently remains on supplemental oxygen and is stable. Continue bronchodilators  AKI on CKD 3B Baseline creatinine 1.9 from January.  Creatinine was elevated and peaked at 2.61.  Improved after hydration.  Currently of hydration.  Creatinine back to baseline Recent Labs    09/18/22 0023 09/20/22 0932 10/12/22 0125 10/12/22 0143 12/24/22 1612 12/25/22 0126 12/26/22 0847 12/27/22 1154 12/29/22 0203 12/31/22 0952  BUN 23 22 30* 35* 19 23 39* 42* 31* 32*  CREATININE 2.01* 1.80* 1.94* 1.90* 1.88* 2.19* 2.61* 2.26* 2.09* 1.87*   Acute urinary retention BPH 4/11, patient was noted to have urinary retention requiring coud catheterization Most recent abdominal imaging from January 2024 showed prostatomegaly. Start Flomax. Plan to continue Foley catheter at discharge to follow-up with urology as an outpatient for voiding trial  Chronic diastolic CHF Volume status stable Most recent echo from 2022 with EF 60 to 65%.   PTA, not on beta-blocker due to history of bradycardia.  Not on diuretics/ACE/ARB/spironolactone because of CKD.  Paroxysmal atrial fibrillation Currently in normal sinus rhythm.  PTA not on any AV nodal blocking agent due to history of bradycardia Chronically anticoagulated on Eliquis 5 mg twice daily.   CAD s/p CABG HLD  Denies anginal symptoms.  Eliquis plan as above Continue Lipitor   Type 2 diabetes mellitus uncontrolled with hyperglycemia A1c 10.7 in 2024 PTA on Basaglar 40 units morning, Humalog 3 times daily Currently on Semglee 30 units daily and sliding scale insulin Accu-Cheks. Recent Labs  Lab 12/30/22 1129 12/30/22 1616 12/30/22 2048 12/31/22 0815 12/31/22 1125  GLUCAP  274* 127* 184* 151* 279*    Depression with anxiety Continue Cymbalta, Bupropion, and Seroquel.   Mobility: PT eval obtained  Goals of care   Code  Status: Full Code     DVT prophylaxis:  SCDs Start: 12/24/22 1856 apixaban (ELIQUIS) tablet 5 mg   Antimicrobials: none Fluid: Not currently Consultants: Orthopedics Family Communication: Family not at bedside  Status: Inpatient Level of care:  Med-Surg   Needs to continue in-hospital care:  Dyspnea improving.  Renal function improving.  Hopefully discharge tomorrow  Patient from: Home Anticipated d/c to: Hopefully to SNF tomorrow    Diet:  Diet Order             Diet Carb Modified Fluid consistency: Thin; Room service appropriate? Yes  Diet effective now                   Scheduled Meds:  apixaban  5 mg Oral BID   atorvastatin  80 mg Oral QPM   budesonide (PULMICORT) nebulizer solution  0.25 mg Nebulization BID   buPROPion  150 mg Oral q morning   Chlorhexidine Gluconate Cloth  6 each Topical Daily   DULoxetine  90 mg Oral q morning   feeding supplement (GLUCERNA SHAKE)  237 mL Oral TID BM   insulin aspart  0-15 Units Subcutaneous TID WC   insulin aspart  0-5 Units Subcutaneous QHS   insulin glargine-yfgn  30 Units Subcutaneous Daily   ipratropium-albuterol  3 mL Nebulization Q6H   multivitamin with minerals  1 tablet Oral Daily   pantoprazole  40 mg Oral q AM   QUEtiapine  50 mg Oral Daily    PRN meds: HYDROcodone-acetaminophen, senna-docusate   Infusions:   cefTRIAXone (ROCEPHIN)  IV 1 g (12/31/22 0530)   ferric gluconate (FERRLECIT) IVPB 250 mg (12/31/22 3474)    Antimicrobials: Anti-infectives (From admission, onward)    Start     Dose/Rate Route Frequency Ordered Stop   12/27/22 0800  ceFAZolin (ANCEF) IVPB 2g/100 mL premix        2 g 200 mL/hr over 30 Minutes Intravenous To ShortStay Surgical 12/26/22 1059 12/26/22 1410   12/26/22 1107  ceFAZolin (ANCEF) 2-4 GM/100ML-% IVPB       Note to Pharmacy: [facility], [place]: cabinet override      12/26/22 1107 12/26/22 1400   12/26/22 0600  cefTRIAXone (ROCEPHIN) 1 g in sodium chloride 0.9 % 100 mL  IVPB        1 g 200 mL/hr over 30 Minutes Intravenous Every 24 hours 12/26/22 0520 01/02/23 0559       Nutritional status:  Body mass index is 24.55 kg/m.  Nutrition Problem: Increased nutrient needs Etiology: hip fracture, post-op healing Signs/Symptoms: estimated needs     Objective: Vitals:   12/31/22 0812 12/31/22 1431  BP: 126/86 (!) 150/110  Pulse: 95 84  Resp: 18   Temp: 97.8 F (36.6 C) 97.7 F (36.5 C)  SpO2: 94% 97%    Intake/Output Summary (Last 24 hours) at 12/31/2022 1555 Last data filed at 12/31/2022 0800 Gross per 24 hour  Intake 360 ml  Output 800 ml  Net -440 ml   Filed Weights   12/24/22 1444 12/26/22 1117  Weight: 68 kg 69 kg   Weight change:  Body mass index is 24.55 kg/m.   Physical Exam: General exam: Pleasant, elderly Caucasian male.  Not in pain. Skin: No rashes, lesions or ulcers. HEENT: Atraumatic, normocephalic, no obvious bleeding Lungs: Clear to auscultation bilaterally CVS: regular  rate and rhythm, no murmur GI/Abd Soft, nontender, nondistended, bowel sound present CNS: alert, awake, oriented times 3 Psychiatry: Mood appropriate Extremities: No pedal edema, no calf tenderness  Data Review: I have personally reviewed the laboratory data and studies available.  F/u labs ordered Unresulted Labs (From admission, onward)     Start     Ordered   01/01/23 0500  CBC with Differential/Platelet  Tomorrow morning,   R        12/31/22 0911   01/01/23 0500  Basic metabolic panel  Tomorrow morning,   R        12/31/22 0911   12/26/22 0956  Urinalysis, Routine w reflex microscopic -Urine, Clean Catch  Add-on,   AD       Question:  Specimen Source  Answer:  Urine, Clean Catch   12/26/22 0956            Total time spent in review of labs and imaging, patient evaluation, formulation of plan, documentation and communication with family: 45 minutes  Signed, Lorin Glass, MD [facility] 12/31/2022

## 2022-12-31 NOTE — Progress Notes (Signed)
Physical Therapy Treatment Patient Details Name: Fernando Hess MRN: [id number] DOB: 05/25/1946 Today's Date: 12/31/2022   History of Present Illness 77 y.o. male admitted 4/10 after a fall resulting in  Right displaced femoral neck fracture, s/p Right hip hemiarthroplasty on 4/12. PMH significant for DM2, HTN, HLD, OSA not on CPAP, CAD s/p CABG, PAF on Eliquis, chronic diastolic CHF,CKD stage IIIb, depression/anxiety, cognitive impairment.    PT Comments    Pt slowly progressing towards goals. Pt was Max A +2 for standing EOB with RW and Min A +2 for standing with stedy due to body mechanics. Pt has significant difficulty following multi modal directions. Pt was able to improve bed mobility to Min A to sit EOB this session with directions for sequencing. Due to pt current functional status, home set up and available assistance recommending skilled physical therapy services at a higher level of care and frequency on discharge from acute care hospital setting at 5x/weekly in order to work on strength, balance and functional mobility to decrease risk for falls, injury, immobility, skin break down and re-hospitalization.  Pt was hyperventilating prior to standing the first time which seemed to slow down and resolve with consecutive attempts.     Recommendations for follow up therapy are one component of a multi-disciplinary discharge planning process, led by the attending physician.  Recommendations may be updated based on patient status, additional functional criteria and insurance authorization.  Follow Up Recommendations  Can patient physically be transported by private vehicle: No    Assistance Recommended at Discharge Frequent or constant Supervision/Assistance  Patient can return home with the following Two people to help with walking and/or transfers;Assistance with cooking/housework;Assist for transportation;Help with stairs or ramp for entrance   Equipment Recommendations  None  recommended by PT    Recommendations for Other Services       Precautions / Restrictions Precautions Precautions: Fall Restrictions Weight Bearing Restrictions: Yes RLE Weight Bearing: Weight bearing as tolerated     Mobility  Bed Mobility Overal bed mobility: Needs Assistance Bed Mobility: Supine to Sit     Supine to sit: Min assist     General bed mobility comments: Pt was Min A for progressing the trunk up to midline. Extra time to get legs to EOB. Patient Response: Cooperative, Anxious  Transfers Overall transfer level: Needs assistance Equipment used: Rolling walker (2 wheels) Transfers: Sit to/from Stand, Bed to chair/wheelchair/BSC Sit to Stand: Max assist, +2 safety/equipment, From elevated surface           General transfer comment: With heavy verbal cues pt had difficulty understanding directions for safe standing from EOB. 2 person assist for safety with stabilizing RW and with lifing up to standing from sitting. Pt tries to stand by pulling up from RW and keeing knees extended and away from the bed despite heavy multi modal cueing. Pt did well with lift equipment requiring 2 person MiN A for pulling up  but has difficulty extending his hips to clear the sitting surface of stedy for transfer. Transfer via Lift Equipment: Stedy  Ambulation/Gait               General Gait Details: Due to pt current functional status, unable to progress gait secondary to pt is unable to wgt shift      Balance Overall balance assessment: Needs assistance Sitting-balance support: Feet supported, Bilateral upper extremity supported Sitting balance-Leahy Scale: Fair Sitting balance - Comments: sitting EOB with min guard and cues for anterior weight shift and  assist with BLE placement Postural control: Posterior lean Standing balance support: Reliant on assistive device for balance, Bilateral upper extremity supported Standing balance-Leahy Scale: Poor Standing balance  comment: with stedy support and with 2 person Max A for standing with RW for <30 seconds at EOB 2x.        Cognition Arousal/Alertness: Awake/alert Behavior During Therapy: WFL for tasks assessed/performed Overall Cognitive Status: No family/caregiver present to determine baseline cognitive functioning         General Comments: Pt has difficulty following directions           General Comments General comments (skin integrity, edema, etc.): Pt appears very anxious about mobility, shaking when standing, reporting he is nervous about the pain and mobility.      Pertinent Vitals/Pain Pain Assessment Pain Assessment: Faces Faces Pain Scale: Hurts little more Pain Location: Rt hip Pain Descriptors / Indicators: Aching, Grimacing, Guarding, Tender Pain Intervention(s): Monitored during session, Premedicated before session     PT Goals (current goals can now be found in the care plan section) Acute Rehab PT Goals Patient Stated Goal: None stated PT Goal Formulation: Patient unable to participate in goal setting Time For Goal Achievement: 01/10/23 Potential to Achieve Goals: Fair Progress towards PT goals: Progressing toward goals    Frequency    Min 3X/week      PT Plan Current plan remains appropriate       AM-PAC PT "6 Clicks" Mobility   Outcome Measure  Help needed turning from your back to your side while in a flat bed without using bedrails?: A Little Help needed moving from lying on your back to sitting on the side of a flat bed without using bedrails?: A Little Help needed moving to and from a bed to a chair (including a wheelchair)?: Total Help needed standing up from a chair using your arms (e.g., wheelchair or bedside chair)?: Total Help needed to walk in hospital room?: Total Help needed climbing 3-5 steps with a railing? : Total 6 Click Score: 10    End of Session Equipment Utilized During Treatment: Gait belt Activity Tolerance: Patient limited by  fatigue;Patient limited by pain;Other (comment) (nervousness) Patient left: in chair;with call bell/phone within reach;with chair alarm set Nurse Communication: Mobility status;Need for lift equipment PT Visit Diagnosis: Unsteadiness on feet (R26.81);Repeated falls (R29.6);Muscle weakness (generalized) (M62.81);History of falling (Z91.81);Difficulty in walking, not elsewhere classified (R26.2);Other symptoms and signs involving the nervous system (R29.898);Pain Pain - Right/Left: Right Pain - part of body: Hip     Time: 9604-5409 PT Time Calculation (min) (ACUTE ONLY): 20 min  Charges:  $Therapeutic Activity: 8-22 mins                    Harrel Carina, DPT, CLT  Acute Rehabilitation Services Office: [phone] (Secure chat preferred)    Claudia Desanctis 12/31/2022, 2:19 PM

## 2023-01-01 DIAGNOSIS — S72001D Fracture of unspecified part of neck of right femur, subsequent encounter for closed fracture with routine healing: Secondary | ICD-10-CM | POA: Diagnosis not present

## 2023-01-01 LAB — CBC WITH DIFFERENTIAL/PLATELET
Abs Immature Granulocytes: 0.52 10*3/uL — ABNORMAL HIGH (ref 0.00–0.07)
Basophils Absolute: 0.1 10*3/uL (ref 0.0–0.1)
Basophils Relative: 1 %
Eosinophils Absolute: 0.3 10*3/uL (ref 0.0–0.5)
Eosinophils Relative: 2 %
HCT: 35.7 % — ABNORMAL LOW (ref 39.0–52.0)
Hemoglobin: 11.7 g/dL — ABNORMAL LOW (ref 13.0–17.0)
Immature Granulocytes: 5 %
Lymphocytes Relative: 16 %
Lymphs Abs: 1.8 10*3/uL (ref 0.7–4.0)
MCH: 28.8 pg (ref 26.0–34.0)
MCHC: 32.8 g/dL (ref 30.0–36.0)
MCV: 87.9 fL (ref 80.0–100.0)
Monocytes Absolute: 1.4 10*3/uL — ABNORMAL HIGH (ref 0.1–1.0)
Monocytes Relative: 13 %
Neutro Abs: 6.7 10*3/uL (ref 1.7–7.7)
Neutrophils Relative %: 63 %
Platelets: 226 10*3/uL (ref 150–400)
RBC: 4.06 MIL/uL — ABNORMAL LOW (ref 4.22–5.81)
RDW: 14.6 % (ref 11.5–15.5)
WBC: 10.7 10*3/uL — ABNORMAL HIGH (ref 4.0–10.5)
nRBC: 0 % (ref 0.0–0.2)

## 2023-01-01 LAB — BASIC METABOLIC PANEL
Anion gap: 9 (ref 5–15)
BUN: 33 mg/dL — ABNORMAL HIGH (ref 8–23)
CO2: 20 mmol/L — ABNORMAL LOW (ref 22–32)
Calcium: 8 mg/dL — ABNORMAL LOW (ref 8.9–10.3)
Chloride: 107 mmol/L (ref 98–111)
Creatinine, Ser: 1.91 mg/dL — ABNORMAL HIGH (ref 0.61–1.24)
GFR, Estimated: 35 mL/min — ABNORMAL LOW (ref >60)
Glucose, Bld: 135 mg/dL — ABNORMAL HIGH (ref 70–99)
Potassium: 3.4 mmol/L — ABNORMAL LOW (ref 3.5–5.1)
Sodium: 136 mmol/L (ref 135–145)

## 2023-01-01 LAB — GLUCOSE, CAPILLARY
Glucose-Capillary: 192 mg/dL — ABNORMAL HIGH (ref 70–99)
Glucose-Capillary: 192 mg/dL — ABNORMAL HIGH (ref 70–99)

## 2023-01-01 MED ORDER — OXYCODONE HCL 5 MG PO TABS: 5.0000 mg | ORAL_TABLET | ORAL | 0 refills | Status: DC | PRN

## 2023-01-01 MED ORDER — GLUCERNA SHAKE PO LIQD: 237.0000 mL | Freq: Three times a day (TID) | ORAL | 0 refills | Status: DC

## 2023-01-01 MED ORDER — INSULIN ASPART 100 UNIT/ML IJ SOLN: 0.0000 [IU] | Freq: Every day | INTRAMUSCULAR | 11 refills | Status: DC

## 2023-01-01 MED ORDER — DULOXETINE HCL 30 MG PO CPEP: 90.0000 mg | ORAL_CAPSULE | Freq: Every morning | ORAL | 3 refills | Status: DC

## 2023-01-01 MED ORDER — SENNOSIDES-DOCUSATE SODIUM 8.6-50 MG PO TABS: 1.0000 | ORAL_TABLET | Freq: Every evening | ORAL | Status: DC | PRN

## 2023-01-01 MED ORDER — TAMSULOSIN HCL 0.4 MG PO CAPS: 0.4000 mg | ORAL_CAPSULE | Freq: Every day | ORAL | Status: DC

## 2023-01-01 MED ORDER — APIXABAN 5 MG PO TABS: 5.0000 mg | ORAL_TABLET | Freq: Two times a day (BID) | ORAL | Status: DC

## 2023-01-01 MED ORDER — INSULIN ASPART 100 UNIT/ML IJ SOLN: 0.0000 [IU] | Freq: Three times a day (TID) | INTRAMUSCULAR | 11 refills | Status: DC

## 2023-01-01 MED ORDER — BUDESONIDE 0.25 MG/2ML IN SUSP: 0.2500 mg | Freq: Two times a day (BID) | RESPIRATORY_TRACT | 12 refills | Status: DC

## 2023-01-01 NOTE — Discharge Summary (Signed)
Physician Discharge Summary  Fernando Hess UJW:[id number] DOB: 1945-09-18 DOA: 12/24/2022  PCP: Fernando Floro, MD  Admit date: 12/24/2022 Discharge date: 01/01/2023  Admitted From: Home Discharge disposition: SNF.  Called and updated patient's wife this morning.  Recommendations at discharge:  Foley catheter was inserted because of your urinary retention.  You have enlarged prostate and have been started on Flomax.  Discharged with Foley catheter.  Needs voiding trial in next 1 week.  [facility] for follow-up as needed.   Brief narrative: Fernando Hess is a 77 y.o. male with PMH significant for DM2, HTN, HLD, OSA not on CPAP, CAD s/p CABG, PAF on Eliquis, chronic diastolic CHF,CKD stage IIIb, depression/anxiety, cognitive impairment who lives at home with his spouse, normally ambulates with a walker or cane. 4/10, patient was brought to the ED from home after a fall while transferring from chair to walker, impacting right hip leading to immediate pain.  No head injury or loss of consciousness. Patient reports occasional lightheadedness when standing but it did not occur this time. Last dose of Eliquis on the morning of 4/10  In the ED, hemodynamically stable Right hip x-ray showed an acute impacted subcapital right femoral neck fracture. Admitted to [facility] consulted 4/12, underwent right hip hemiarthroplasty by Fernando Hess  Subjective: Patient was seen and examined this morning.  Lying on bed.  Not in distress.  No new symptoms.  Feels ready for discharge today.  I called and updated his wife this morning.  Assessment and plan: Acute impacted subcapital right femoral neck fracture Secondary to a mechanical fall  4/12, underwent right hip hemiarthroplasty by Fernando Hess Currently pain controlled with as needed pain meds. Physical therapy eval obtained.  Send recommended. PTA on Eliquis 5 mg twice daily for A-fib.  Continued on the same for DVT  prophylaxis   UTI  acute urinary retention BPH 4/11, patient was noted to have urinary retention requiring coud catheterization Urinalysis abnormal.  Completed a 7-day course of IV Rocephin. Most recent abdominal imaging from January 2024 showed prostatomegaly. Started on Flomax. Plan to continue Foley catheter at discharge to follow-up with urology as an outpatient for voiding trial  Acute respiratory failure with hypoxia COPD 4/11, patient was dyspneic and required supplemental oxygen. X-ray with hyperinflation of both lungs and mild bibasilar atelectasis. Shortness of breath was worked up in the last few days.  VQ scan negative for pulm embolism.  CT chest showed mild diffuse heterogenicity of pulmonary parenchyma which can be seen with small airway disease.  Patient was treated with DuoNeb, inhalational steroid. Patient was started on 3 L oxygen by nasal cannula.  Currently remains on supplemental oxygen and is stable. Continue bronchodilators  AKI on CKD 3B Baseline creatinine 1.9 from January.  Creatinine was elevated and peaked at 2.61.  Improved after hydration.  Currently of hydration.  Creatinine back to baseline Recent Labs    09/20/22 0932 10/12/22 0125 10/12/22 0143 12/24/22 1612 12/25/22 0126 12/26/22 0847 12/27/22 1154 12/29/22 0203 12/31/22 0952 01/01/23 0316  BUN 22 30* 35* 19 23 39* 42* 31* 32* 33*  CREATININE 1.80* 1.94* 1.90* 1.88* 2.19* 2.61* 2.26* 2.09* 1.87* 1.91*   Chronic diastolic CHF Volume status stable Most recent echo from 2022 with EF 60 to 65%.   PTA, not on beta-blocker due to history of bradycardia.  Not on diuretics/ACE/ARB/spironolactone because of CKD.  Paroxysmal atrial fibrillation Currently in normal sinus rhythm.  PTA not on any AV nodal blocking agent due  to history of bradycardia Chronically anticoagulated on Eliquis 5 mg twice daily.  CAD s/p CABG HLD  Denies anginal symptoms.  Eliquis plan as above Continue Lipitor    Type 2 diabetes mellitus uncontrolled with hyperglycemia A1c 10.7 in 2024 PTA on Basaglar 40 units morning, Humalog 3 times daily Discharge on the same regimen.   Depression with anxiety Continue Cymbalta, Bupropion, and Seroquel.    Goals of care   Code Status: Full Code   Wounds:  - Incision (Closed) 12/26/22 Hip Right (Active)  Date First Assessed/Time First Assessed: 12/26/22 1454   Location: Hip  Location Orientation: Right    Assessments 12/26/2022  3:15 PM 01/01/2023  1:00 AM  Dressing Type Hydrocolloid None  Dressing Clean, Dry, Intact Clean, Dry, Intact  Site / Wound Assessment Dressing in place / Unable to assess --  Drainage Amount None --     No associated orders.     Wound / Incision (Open or Dehisced) 12/31/22 Buttocks Left;Medial Partial thickness loss of dermis presenting as a shallow open ulcer with a red or pink wound bed, without slough or bruising. (Active)  Date First Assessed/Time First Assessed: 12/31/22 2337   Location: Buttocks  Location Orientation: Left;Medial  Wound Description (Comments): Partial thickness loss of dermis presenting as a shallow open ulcer with a red or pink wound bed, without slo...    Assessments 12/31/2022 11:41 PM  Dressing Type Foam - Lift dressing to assess site every shift  Dressing Changed Reinforced  Peri-wound Assessment Pink  Wound Length (cm) 2 cm  Wound Width (cm) 1 cm  Wound Surface Area (cm^2) 2 cm^2  Tunneling (cm) 0  Margins Attached edges (approximated)  Drainage Amount None  Drainage Description No odor  Treatment Cleansed     No associated orders.    Discharge Exam:   Vitals:   12/31/22 1431 12/31/22 1936 01/01/23 0433 01/01/23 0740  BP: (!) 150/110 (!) 142/82 135/79 116/79  Pulse: 84 89 88 76  Resp:  20 20 18   Temp: 97.7 F (36.5 C) 98.4 F (36.9 C) 98.2 F (36.8 C) 98 F (36.7 C)  TempSrc: Oral   Oral  SpO2: 97% 99% 95% 97%  Weight:      Height:        Body mass index is 24.55 kg/m.   General exam: Pleasant, elderly Caucasian male.  Not in pain. Skin: No rashes, lesions or ulcers. HEENT: Atraumatic, normocephalic, no obvious bleeding Lungs: Clear to auscultation bilaterally  CVS: regular rate and rhythm, no murmur GI/Abd Soft, nontender, nondistended, bowel sound present CNS: alert, awake, oriented times 3 Psychiatry: Mood appropriate Extremities: No pedal edema, no calf tenderness  Follow ups:    Contact information for follow-up providers     Joen Laura, MD Follow up in 2 week(s).   Specialty: Orthopedic Surgery Contact information: [address] [name] [address] [phone]         Fernando Floro, MD Follow up.   Specialty: Family Medicine Contact information: [address] [name] [address] [phone]         [facility] Follow up.   Contact information: [address] [place] [address] [phone]             Contact information for after-discharge care     Destination     [facility] .   Service: Skilled Nursing Contact information: [address] [place]  [address] [phone]                     Discharge Instructions:   Discharge Instructions     Call MD for:  difficulty breathing, headache or visual disturbances   Complete by: As directed    Call MD for:  extreme fatigue   Complete by: As directed    Call MD for:  hives   Complete by: As directed    Call MD for:  persistant dizziness or light-headedness   Complete by: As directed    Call MD for:  persistant nausea and vomiting   Complete by: As directed    Call MD for:  severe uncontrolled pain   Complete by: As directed    Call MD for:  temperature >100.4   Complete by: As directed    Diet Carb Modified   Complete by: As directed    Discharge instructions   Complete by: As directed    Recommendations at discharge:    Foley catheter was inserted because of your urinary retention.  You have enlarged prostate and have been started on Flomax.  Discharged with Foley catheter.  Needs voiding trial in next 1 week.  [facility] for follow-up as needed.  General discharge instructions: Follow with Primary MD Fernando Floro, MD in 7 days  Please request your PCP  to go over your hospital tests, procedures, radiology results at the follow up. Please get your medicines reviewed and adjusted.  Your PCP may decide to repeat certain labs or tests as needed. Do not drive, operate heavy machinery, perform activities at heights, swimming or participation in water activities or provide baby sitting services if your were admitted for syncope or siezures until you have seen by Primary MD or a Neurologist and advised to do so again. [place] Controlled Substance Reporting System database was reviewed. Do not drive, operate heavy machinery, perform activities at heights, swim, participate in water activities or provide baby-sitting services while on medications for pain, sleep and mood until your outpatient physician has reevaluated you and advised to do so again.  You are strongly recommended to comply with the dose, frequency and duration of prescribed medications. Activity: As tolerated with Full fall precautions use walker/cane & assistance as needed Avoid using any recreational substances like cigarette, tobacco, alcohol, or non-prescribed drug. If you experience worsening of your admission symptoms, develop shortness of breath, life threatening emergency, suicidal or homicidal thoughts you must seek medical attention immediately by calling 911 or calling your MD immediately  if symptoms less severe. You must read complete instructions/literature along with all the possible adverse reactions/side effects for all the medicines you take and that have been prescribed to you. Take any new medicine only after you have  completely understood and accepted all the possible adverse reactions/side effects.  Wear Seat belts while driving. You were cared for by a hospitalist during your hospital stay. If you have any questions about your discharge medications or the care you received while you were in the hospital after you are discharged, you can call the unit and ask to speak with the hospitalist or the covering physician. Once you are discharged, your primary care physician will handle any further medical issues. Please note that NO REFILLS for any discharge medications will be authorized once you are discharged, as it is imperative that you return to your primary care physician (or establish a relationship with a primary care physician if you do not have one).  Discharge wound care:   Complete by: As directed    Increase activity slowly   Complete by: As directed        Discharge Medications:   Allergies as of 01/01/2023       Reactions   Cilostazol Swelling, Other (See Comments)   Edema   Dulaglutide Nausea And Vomiting, Other (See Comments)   TRULICITY   Levofloxacin Hives, Itching, Rash   Liraglutide Other (See Comments)   Severe fatigue & insomnia   Lisinopril Itching, Rash, Cough        Medication List     STOP taking these medications    guaiFENesin-codeine 100-10 MG/5ML syrup Commonly known as: ROBITUSSIN AC   insulin lispro 100 UNIT/ML KwikPen Commonly known as: HUMALOG       TAKE these medications    acetaminophen 500 MG tablet Commonly known as: TYLENOL Take 2 tablets (1,000 mg total) by mouth every 8 (eight) hours as needed. What changed:  medication strength how much to take when to take this reasons to take this   apixaban 5 MG Tabs tablet Commonly known as: ELIQUIS Take 1 tablet (5 mg total) by mouth 2 (two) times daily. What changed: when to take this   atorvastatin 80 MG tablet Commonly known as: LIPITOR TAKE ONE TABLET BY MOUTH EVERY EVENING   B-D UF III  MINI PEN NEEDLES 31G X 5 MM Misc Generic drug: Insulin Pen Needle USE AS DIRECTED WITH LANTUS SOLOSTAR   Basaglar KwikPen 100 UNIT/ML Inject 40 Units into the skin in the morning.   budesonide 0.25 MG/2ML nebulizer solution Commonly known as: PULMICORT Take 2 mLs (0.25 mg total) by nebulization 2 (two) times daily.   buPROPion 150 MG 24 hr tablet Commonly known as: WELLBUTRIN XL Take 150 mg by mouth every morning.   DULoxetine 30 MG capsule Commonly known as: CYMBALTA Take 3 capsules (90 mg total) by mouth every morning. Start taking on: January 02, 2023 What changed:  how much to take additional instructions Another medication with the same name was removed. Continue taking this medication, and follow the directions you see here.   feeding supplement (GLUCERNA SHAKE) Liqd Take 237 mLs by mouth 3 (three) times daily between meals.   FreeStyle Libre 2 Sensor Misc Inject 1 Device into the skin every 14 (fourteen) days.   insulin aspart 100 UNIT/ML injection Commonly known as: novoLOG Inject 0-15 Units into the skin 3 (three) times daily with meals.   insulin aspart 100 UNIT/ML injection Commonly known as: novoLOG Inject 0-5 Units into the skin at bedtime.   MAGNESIUM PO Take 350 mg by mouth every evening.   nitroGLYCERIN 0.4 MG SL tablet Commonly known as: NITROSTAT Place 0.4 mg under the tongue every 5 (five) minutes as needed for chest pain.   nystatin cream Commonly known as: MYCOSTATIN Apply 1 Application topically 2 (two) times daily as needed for dry skin. Apply to the groin in the morning and evening when not using the nystatin powder   nystatin powder Commonly known as: MYCOSTATIN/NYSTOP Apply 1 Application topically 2 (two) times daily as needed (skin irritation). Apply to the groin in the morning and evening   oxyCODONE 5 MG immediate release tablet Commonly known as: Roxicodone Take 1 tablet (5 mg total) by mouth every 4 (four) hours as needed for up to 7  days for severe pain or moderate pain.   pantoprazole 40 MG tablet Commonly known as: PROTONIX TAKE 1 TABLET BY MOUTH EVERY DAY What changed: when  to take this   polyethylene glycol 17 g packet Commonly known as: MIRALAX / GLYCOLAX Take 17 g by mouth daily as needed for mild constipation.   QUEtiapine 50 MG tablet Commonly known as: SEROQUEL Take 1 tablet (50 mg total) by mouth 2 (two) times daily. What changed: when to take this   SENIOR MULTIVITAMIN PLUS PO Take 1 tablet by mouth daily with breakfast.   senna-docusate 8.6-50 MG tablet Commonly known as: Senokot-S Take 1 tablet by mouth at bedtime as needed for mild constipation.   tamsulosin 0.4 MG Caps capsule Commonly known as: FLOMAX Take 1 capsule (0.4 mg total) by mouth daily after breakfast. Start taking on: January 02, 2023               Discharge Care Instructions  (From admission, onward)           Start     Ordered   01/01/23 0000  Discharge wound care:        01/01/23 1610             The results of significant diagnostics from this hospitalization (including imaging, microbiology, ancillary and laboratory) are listed below for reference.    Procedures and Diagnostic Studies:   DG Knee Right Port  Result Date: 12/25/2022 CLINICAL DATA:  Femoral neck fracture EXAM: PORTABLE RIGHT KNEE - 1-2 VIEW COMPARISON:  Hip radiograph 12/24/2022 FINDINGS: No fracture or malalignment. No sizable knee effusion. Clips in the proximal lower leg IMPRESSION: Negative. Electronically Signed   By: Jasmine Pang M.D.   On: 12/25/2022 20:47   DG Chest Port 1 View  Result Date: 12/25/2022 CLINICAL DATA:  Dyspnea. EXAM: PORTABLE CHEST 1 VIEW COMPARISON:  December 24, 2022. FINDINGS: Stable cardiomediastinal silhouette. Status post coronary bypass graft. Hypoinflation of the lungs with mild bibasilar subsegmental atelectasis or possibly infiltrate. Bony thorax is unremarkable. IMPRESSION: Hypoinflation of the lungs with  mild bibasilar subsegmental atelectasis or possibly infiltrates. Electronically Signed   By: Lupita Raider M.D.   On: 12/25/2022 16:00   DG Chest 1 View  Result Date: 12/24/2022 CLINICAL DATA:  Hip fracture EXAM: CHEST  1 VIEW COMPARISON:  Chest x-ray dated October 12, 2022 FINDINGS: Cardiac and mediastinal contours are unchanged post median sternotomy and CABG. Elevation of the right hemidiaphragm and right basilar atelectasis. No evidence of pleural effusion or pneumothorax. IMPRESSION: Elevation of the right hemidiaphragm and right basilar atelectasis. Electronically Signed   By: Allegra Lai M.D.   On: 12/24/2022 17:17   DG Hip Unilat W or Wo Pelvis 2-3 Views Right  Result Date: 12/24/2022 CLINICAL DATA:  Fall EXAM: DG HIP (WITH OR WITHOUT PELVIS) 2-3V RIGHT COMPARISON:  None Available. FINDINGS: There is an acute impacted subcapital right femoral neck fracture. No dislocation. The bones are osteopenic. No significant displacement. IMPRESSION: Acute impacted subcapital right femoral neck fracture. Electronically Signed   By: Darliss Cheney M.D.   On: 12/24/2022 15:57     Labs:   Basic Metabolic Panel: Recent Labs  Lab 12/26/22 0847 12/27/22 1154 12/29/22 0203 12/31/22 0952 01/01/23 0316  NA 137 134* 138 134* 136  K 4.7 4.4 4.0 3.9 3.4*  CL 106 103 102 102 107  CO2 20* 20* 24 25 20*  GLUCOSE 210* 245* 208* 338* 135*  BUN 39* 42* 31* 32* 33*  CREATININE 2.61* 2.26* 2.09* 1.87* 1.91*  CALCIUM 8.3* 8.2* 8.6* 8.2* 8.0*  MG  --   --  1.8  --   --  GFR Estimated Creatinine Clearance: 28.8 mL/min (A) (by C-G formula based on SCr of 1.91 mg/dL (H)). Liver Function Tests: Recent Labs  Lab 12/29/22 0203  AST 33  ALT 19  ALKPHOS 83  BILITOT 1.1  PROT 5.3*  ALBUMIN 2.2*   No results for input(s): "LIPASE", "AMYLASE" in the last 168 hours. No results for input(s): "AMMONIA" in the last 168 hours. Coagulation profile No results for input(s): "INR", "PROTIME" in the last  168 hours.  CBC: Recent Labs  Lab 12/26/22 0847 12/27/22 1154 12/29/22 0203 12/31/22 0952 01/01/23 0316  WBC 11.8* 10.4 9.3 9.3 10.7*  NEUTROABS  --   --   --   --  6.7  HGB 13.1 11.3* 12.5* 11.6* 11.7*  HCT 40.9 35.5* 36.9* 34.9* 35.7*  MCV 91.3 91.3 87.6 88.1 87.9  PLT 158 148* 154 208 226   Cardiac Enzymes: No results for input(s): "CKTOTAL", "CKMB", "CKMBINDEX", "TROPONINI" in the last 168 hours. BNP: Invalid input(s): "POCBNP" CBG: Recent Labs  Lab 12/31/22 1125 12/31/22 1628 12/31/22 1946 01/01/23 0737 01/01/23 1116  GLUCAP 279* 172* 156* 192* 192*   D-Dimer No results for input(s): "DDIMER" in the last 72 hours. Hgb A1c No results for input(s): "HGBA1C" in the last 72 hours. Lipid Profile No results for input(s): "CHOL", "HDL", "LDLCALC", "TRIG", "CHOLHDL", "LDLDIRECT" in the last 72 hours. Thyroid function studies No results for input(s): "TSH", "T4TOTAL", "T3FREE", "THYROIDAB" in the last 72 hours.  Invalid input(s): "FREET3" Anemia work up No results for input(s): "VITAMINB12", "FOLATE", "FERRITIN", "TIBC", "IRON", "RETICCTPCT" in the last 72 hours. Microbiology Recent Results (from the past 240 hour(s))  Surgical PCR screen     Status: None   Collection Time: 12/26/22 11:02 AM   Specimen: Nasal Mucosa; Nasal Swab  Result Value Ref Range Status   MRSA, PCR NEGATIVE NEGATIVE Final   Staphylococcus aureus NEGATIVE NEGATIVE Final    Comment: (NOTE) The Xpert SA Assay (FDA approved for NASAL specimens in patients 18 years of age and older), is one component of a comprehensive surveillance program. It is not intended to diagnose infection nor to guide or monitor treatment. Performed at [facility], [address], [place], [name] [address]     Time coordinating discharge: 55 minutes  Signed: Goran Olden  [facility] 01/01/2023, 11:32 AM

## 2023-01-01 NOTE — Progress Notes (Signed)
Subjective: 6 Days Post-Op s/p Procedure(s): RIGHT HEMI HIP ARTHROPLASTY  Patient is alert, oriented.  States pain is bad this morning, continued since surgery.  Sitting in bed eating breakfast.  Was able to stand using walker with PT yesterday but still has not been able to ambulate.  Reports normal bowel movements.  Denies chest pain, SOB, Calf pain. No nausea/vomiting. No other complaints.  Objective:  PE: VITALS:   Vitals:   12/31/22 0812 12/31/22 1431 12/31/22 1936 01/01/23 0433  BP: 126/86 (!) 150/110 (!) 142/82 135/79  Pulse: 95 84 89 88  Resp: Temp: 97.8 F (36.6 C) 97.7 F (36.5 C) 98.4 F (36.9 C) 98.2 F (36.8 C)  TempSrc: Oral Oral    SpO2: 94% 97% 99% 95%  Weight:      Height:       General: laying in bed, in no acute distress Resp: normal respiratory effort, nasal cannula in place GI: soft, nontender abdomen MSK: Incision CDI. Dorsiflexion/plantarflexion intact at right ankle. Distal sensation intact. 2+ PT pulse. No ability to dorsiflex at left ankle. Wiggles toes appropriately.     LABS  Results for orders placed or performed during the hospital encounter of 12/24/22 (from the past 24 hour(s))  Glucose, capillary     Status: Abnormal   Collection Time: 12/31/22  8:15 AM  Result Value Ref Range   Glucose-Capillary 151 (H) 70 - 99 mg/dL  Basic metabolic panel     Status: Abnormal   Collection Time: 12/31/22  9:52 AM  Result Value Ref Range   Sodium 134 (L) 135 - 145 mmol/L   Potassium 3.9 3.5 - 5.1 mmol/L   Chloride 102 98 - 111 mmol/L   CO2 25 22 - 32 mmol/L   Glucose, Bld 338 (H) 70 - 99 mg/dL   BUN 32 (H) 8 - 23 mg/dL   Creatinine, Ser 1.61 (H) 0.61 - 1.24 mg/dL   Calcium 8.2 (L) 8.9 - 10.3 mg/dL   GFR, Estimated 36 (L) >60 mL/min   Anion gap 7 5 - 15  CBC     Status: Abnormal   Collection Time: 12/31/22  9:52 AM  Result Value Ref Range   WBC 9.3 4.0 - 10.5 K/uL   RBC 3.96 (L) 4.22 - 5.81 MIL/uL   Hemoglobin 11.6 (L) 13.0 -  17.0 g/dL   HCT 09.6 (L) 04.5 - 40.9 %   MCV 88.1 80.0 - 100.0 fL   MCH 29.3 26.0 - 34.0 pg   MCHC 33.2 30.0 - 36.0 g/dL   RDW 81.1 91.4 - 78.2 %   Platelets 208 150 - 400 K/uL   nRBC 0.0 0.0 - 0.2 %  Glucose, capillary     Status: Abnormal   Collection Time: 12/31/22 11:25 AM  Result Value Ref Range   Glucose-Capillary 279 (H) 70 - 99 mg/dL  Glucose, capillary     Status: Abnormal   Collection Time: 12/31/22  4:28 PM  Result Value Ref Range   Glucose-Capillary 172 (H) 70 - 99 mg/dL  Glucose, capillary     Status: Abnormal   Collection Time: 12/31/22  7:46 PM  Result Value Ref Range   Glucose-Capillary 156 (H) 70 - 99 mg/dL  CBC with Differential/Platelet     Status: Abnormal   Collection Time: 01/01/23  3:16 AM  Result Value Ref Range   WBC 10.7 (H) 4.0 - 10.5 K/uL   RBC 4.06 (L) 4.22 - 5.81 MIL/uL   Hemoglobin  11.7 (L) 13.0 - 17.0 g/dL   HCT 96.0 (L) 45.4 - 09.8 %   MCV 87.9 80.0 - 100.0 fL   MCH 28.8 26.0 - 34.0 pg   MCHC 32.8 30.0 - 36.0 g/dL   RDW 11.9 14.7 - 82.9 %   Platelets 226 150 - 400 K/uL   nRBC 0.0 0.0 - 0.2 %   Neutrophils Relative % 63 %   Neutro Abs 6.7 1.7 - 7.7 K/uL   Lymphocytes Relative 16 %   Lymphs Abs 1.8 0.7 - 4.0 K/uL   Monocytes Relative 13 %   Monocytes Absolute 1.4 (H) 0.1 - 1.0 K/uL   Eosinophils Relative 2 %   Eosinophils Absolute 0.3 0.0 - 0.5 K/uL   Basophils Relative 1 %   Basophils Absolute 0.1 0.0 - 0.1 K/uL   Immature Granulocytes 5 %   Abs Immature Granulocytes 0.52 (H) 0.00 - 0.07 K/uL  Basic metabolic panel     Status: Abnormal   Collection Time: 01/01/23  3:16 AM  Result Value Ref Range   Sodium 136 135 - 145 mmol/L   Potassium 3.4 (L) 3.5 - 5.1 mmol/L   Chloride 107 98 - 111 mmol/L   CO2 20 (L) 22 - 32 mmol/L   Glucose, Bld 135 (H) 70 - 99 mg/dL   BUN 33 (H) 8 - 23 mg/dL   Creatinine, Ser 5.62 (H) 0.61 - 1.24 mg/dL   Calcium 8.0 (L) 8.9 - 10.3 mg/dL   GFR, Estimated 35 (L) >60 mL/min   Anion gap 9 5 - 15     ECHOCARDIOGRAM COMPLETE  Result Date: 12/30/2022    ECHOCARDIOGRAM REPORT   Patient Name:   Fernando Hess Lowden Date of Exam: 12/30/2022 Medical Rec #:  [id number]      Height:       66.0 in Accession #:    [id number]     Weight:       152.1 lb Date of Birth:  [birth year]      BSA:          1.780 m Patient Age:    77 years       BP:           142/94 mmHg Patient Gender: M              HR:           102 bpm. Exam Location:  Inpatient Procedure: 2D Echo, Cardiac Doppler, Color Doppler and Intracardiac            Opacification Agent Indications:    Dyspnea R06.00  History:        Patient has prior history of Echocardiogram examinations, most                 recent 05/10/2021. CAD, Prior CABG, Arrythmias:Atrial                 Fibrillation, Signs/Symptoms:Syncope; Risk Factors:Hypertension,                 Sleep Apnea, Diabetes and Dyslipidemia. CKD, stage 3.  Sonographer:    Lucendia Herrlich Referring Phys: [id number] [place] IMPRESSIONS  1. Left ventricular ejection fraction, by estimation, is 60 to 65%. The left ventricle has normal function. The left ventricle has no regional wall motion abnormalities. There is moderate concentric left ventricular hypertrophy. Left ventricular diastolic parameters are consistent with Grade I diastolic dysfunction (impaired relaxation).  2. Right ventricular systolic function is normal. The right ventricular size is  normal.  3. The mitral valve is degenerative. Mild mitral valve regurgitation. No evidence of mitral stenosis.  4. Decreased LV stroke volume index. The aortic valve is tricuspid. There is moderate calcification of the aortic valve. Aortic valve regurgitation is mild. Mild aortic valve stenosis. Aortic valve mean gradient measures 11.0 mmHg.  5. The inferior vena cava is normal in size with greater than 50% respiratory variability, suggesting right atrial pressure of 3 mmHg. Comparison(s): No significant change from prior study. Prior images reviewed side by side.  FINDINGS  Left Ventricle: Left ventricular ejection fraction, by estimation, is 60 to 65%. The left ventricle has normal function. The left ventricle has no regional wall motion abnormalities. Definity contrast agent was given IV to delineate the left ventricular  endocardial borders. The left ventricular internal cavity size was normal in size. There is moderate concentric left ventricular hypertrophy. Left ventricular diastolic parameters are consistent with Grade I diastolic dysfunction (impaired relaxation). Right Ventricle: The right ventricular size is normal. No increase in right ventricular wall thickness. Right ventricular systolic function is normal. Left Atrium: Left atrial size was normal in size. Right Atrium: Right atrial size was normal in size. Pericardium: There is no evidence of pericardial effusion. Mitral Valve: The mitral valve is degenerative in appearance. Mild mitral valve regurgitation. No evidence of mitral valve stenosis. Tricuspid Valve: The tricuspid valve is normal in structure. Tricuspid valve regurgitation is not demonstrated. No evidence of tricuspid stenosis. Aortic Valve: Decreased LV stroke volume index. The aortic valve is tricuspid. There is moderate calcification of the aortic valve. There is moderate aortic valve annular calcification. Aortic valve regurgitation is mild. Mild aortic stenosis is present.  Aortic valve mean gradient measures 11.0 mmHg. Aortic valve peak gradient measures 20.2 mmHg. Aortic valve area, by VTI measures 1.40 cm. Pulmonic Valve: The pulmonic valve was normal in structure. Pulmonic valve regurgitation is not visualized. No evidence of pulmonic stenosis. Aorta: The aortic root and ascending aorta are structurally normal, with no evidence of dilitation. Venous: The inferior vena cava is normal in size with greater than 50% respiratory variability, suggesting right atrial pressure of 3 mmHg. IAS/Shunts: The atrial septum is grossly normal.  LEFT  VENTRICLE PLAX 2D LVIDd:         4.00 cm   Diastology LVIDs:         2.80 cm   LV e' medial:    5.59 cm/s LV PW:         1.40 cm   LV E/e' medial:  12.7 LV IVS:        1.40 cm   LV e' lateral:   10.10 cm/s LVOT diam:     1.90 cm   LV E/e' lateral: 7.1 LV SV:         48 LV SV Index:   27 LVOT Area:     2.84 cm  IVC IVC diam: 1.20 cm LEFT ATRIUM             Index        RIGHT ATRIUM           Index LA diam:        5.00 cm 2.81 cm/m   RA Area:     12.20 cm LA Vol (A2C):   44.4 ml 24.94 ml/m  RA Volume:   21.60 ml  12.13 ml/m LA Vol (A4C):   36.5 ml 20.50 ml/m LA Biplane Vol: 41.1 ml 23.09 ml/m  AORTIC VALVE AV Area (Vmax):  1.44 cm AV Area (Vmean):   1.35 cm AV Area (VTI):     1.40 cm AV Vmax:           225.00 cm/s AV Vmean:          152.000 cm/s AV VTI:            0.342 m AV Peak Grad:      20.2 mmHg AV Mean Grad:      11.0 mmHg LVOT Vmax:         114.67 cm/s LVOT Vmean:        72.433 cm/s LVOT VTI:          0.169 m LVOT/AV VTI ratio: 0.49  AORTA Ao Root diam: 3.30 cm Ao Asc diam:  3.80 cm MITRAL VALVE               TRICUSPID VALVE MV Area (PHT): 4.41 cm    TR Peak grad:   8.8 mmHg MV Decel Time: 172 msec    TR Vmax:        148.00 cm/s MV E velocity: 71.25 cm/s MV A velocity: 97.70 cm/s  SHUNTS MV E/A ratio:  0.73        Systemic VTI:  0.17 m                            Systemic Diam: 1.90 cm Riley Lam MD Electronically signed by Riley Lam MD Signature Date/Time: 12/30/2022/12:31:30 PM    Final     Assessment/Plan: Right femoral neck fracture 6 Days Post-Op s/p Procedure(s): RIGHT HEMI HIP ARTHROPLASTY  Labs reviewed, Hgb appears stable.  Post op recs: WB: WBAT, no formal hip precautions. Likely to be limited by what looks to be left sided foot drop after his stroke one month ago Abx: Ancef given preincision, patient will continue on scheduled ceftriaxone for treatment of his urinary tract infection Imaging: PACU xrays appear stable Dressing: Aquacel dressing to be kept  intact until follow-up DVT prophylaxis: Resume Eliquis 2.5 twice daily postop day 1 and postop day 2, resume Eliquis 5 mg twice daily starting postop day 3 Anticipate DC to SNF/Rehab Facility Follow up: 2 weeks after surgery for a wound check with Dr. Blanchie Dessert at [facility].   Address: [address], [place], [name] [address]  Office Phone: [phone]  Cecil Cobbs 01/01/2023, 7:01 AM

## 2023-01-01 NOTE — TOC Transition Note (Signed)
Transition of Care [facility]) - CM/SW Discharge Note   Patient Details  Name: Fernando Hess MRN: [id number] Date of Birth: 02/20/1946  Transition of Care [facility]) CM/SW Contact:  Lorri Frederick, LCSW Phone Number: 01/01/2023, 1:33 PM   Clinical Narrative:   Pt discharging to [facility]. RN call report to [phone].  1000: CSW confirmed with Madalyn Rob that they can receive pt today.  No hard script, spoke to Ortho, they did bring one, which was faxed to [facility] at their request.     Final next level of care: Skilled Nursing Facility Barriers to Discharge: Barriers Resolved   Patient Goals and CMS Choice CMS Medicare.gov Compare Post Acute Care list provided to:: Patient Represenative (must comment) (Patients spouse) Choice offered to / list presented to : Spouse  Discharge Placement                Patient chooses bed at:  [facility]) Patient to be transferred to facility by: [facility] Name of family member notified: wife Carney Bern Patient and family notified of of transfer: 01/01/23  Discharge Plan and Services Additional resources added to the After Visit Summary for   In-house Referral: Clinical Social Work                                   Social Determinants of Health (SDOH) Interventions SDOH Screenings   Food Insecurity: No Food Insecurity (09/18/2022)  Housing: Low Risk  (09/18/2022)  Transportation Needs: No Transportation Needs (09/18/2022)  Utilities: Not At Risk (09/18/2022)  Depression (PHQ2-9): Low Risk  (05/20/2019)  Tobacco Use: Low Risk  (12/30/2022)     Readmission Risk Interventions     No data to display

## 2023-01-01 NOTE — Care Management Important Message (Signed)
Important Message  Patient Details  Name: Fernando Hess MRN: [id number] Date of Birth: September 26, 1945   Medicare Important Message Given:  Yes     Sherilyn Banker 01/01/2023, 12:11 PM

## 2023-01-01 NOTE — Plan of Care (Signed)
  Problem: Education: Goal: Ability to describe self-care measures that may prevent or decrease complications (Diabetes Survival Skills Education) will improve Outcome: Progressing   Problem: Coping: Goal: Ability to adjust to condition or change in health will improve Outcome: Progressing   Problem: Fluid Volume: Goal: Ability to maintain a balanced intake and output will improve Outcome: Progressing   Problem: Health Behavior/Discharge Planning: Goal: Ability to identify and utilize available resources and services will improve Outcome: Progressing Goal: Ability to manage health-related needs will improve Outcome: Progressing   Problem: Metabolic: Goal: Ability to maintain appropriate glucose levels will improve Outcome: Progressing   Problem: Nutritional: Goal: Maintenance of adequate nutrition will improve Outcome: Progressing   Problem: Skin Integrity: Goal: Risk for impaired skin integrity will decrease Outcome: Progressing   Problem: Tissue Perfusion: Goal: Adequacy of tissue perfusion will improve Outcome: Progressing   Problem: Clinical Measurements: Goal: Ability to maintain clinical measurements within normal limits will improve Outcome: Progressing Goal: Will remain free from infection Outcome: Progressing Goal: Diagnostic test results will improve Outcome: Progressing Goal: Respiratory complications will improve Outcome: Progressing Goal: Cardiovascular complication will be avoided Outcome: Progressing   Problem: Activity: Goal: Risk for activity intolerance will decrease Outcome: Progressing   Problem: Nutrition: Goal: Adequate nutrition will be maintained Outcome: Progressing   Problem: Coping: Goal: Level of anxiety will decrease Outcome: Progressing   Problem: Elimination: Goal: Will not experience complications related to bowel motility Outcome: Progressing

## 2023-01-01 NOTE — Progress Notes (Signed)
Discharge summary/necessary documents provided to [facility], report given to Elon Jester, staff nurse at [facility], all concerns/questions were fully answered. . Pt remains alert in no apparent distress, and d/c  to [facility] as ordered. Spouse at bedside. No complaints.

## 2023-01-04 ENCOUNTER — Emergency Department (HOSPITAL_COMMUNITY): Payer: Medicare Other

## 2023-01-04 ENCOUNTER — Other Ambulatory Visit: Payer: Self-pay

## 2023-01-04 ENCOUNTER — Emergency Department (HOSPITAL_COMMUNITY)
Admission: EM | Admit: 2023-01-04 | Discharge: 2023-01-04 | Disposition: A | Discharge status: Skilled Nursing Facility | Payer: Medicare Other | Attending: Emergency Medicine | Admitting: Emergency Medicine

## 2023-01-04 DIAGNOSIS — Z7901 Long term (current) use of anticoagulants: Secondary | ICD-10-CM | POA: Diagnosis not present

## 2023-01-04 DIAGNOSIS — W19XXXA Unspecified fall, initial encounter: Secondary | ICD-10-CM | POA: Insufficient documentation

## 2023-01-04 DIAGNOSIS — I130 Hypertensive heart and chronic kidney disease with heart failure and stage 1 through stage 4 chronic kidney disease, or unspecified chronic kidney disease: Secondary | ICD-10-CM | POA: Diagnosis not present

## 2023-01-04 DIAGNOSIS — E1122 Type 2 diabetes mellitus with diabetic chronic kidney disease: Secondary | ICD-10-CM | POA: Insufficient documentation

## 2023-01-04 DIAGNOSIS — I5032 Chronic diastolic (congestive) heart failure: Secondary | ICD-10-CM | POA: Diagnosis not present

## 2023-01-04 DIAGNOSIS — Z794 Long term (current) use of insulin: Secondary | ICD-10-CM | POA: Insufficient documentation

## 2023-01-04 DIAGNOSIS — M25551 Pain in right hip: Secondary | ICD-10-CM | POA: Insufficient documentation

## 2023-01-04 DIAGNOSIS — I2510 Atherosclerotic heart disease of native coronary artery without angina pectoris: Secondary | ICD-10-CM | POA: Insufficient documentation

## 2023-01-04 DIAGNOSIS — N189 Chronic kidney disease, unspecified: Secondary | ICD-10-CM | POA: Insufficient documentation

## 2023-01-04 DIAGNOSIS — R42 Dizziness and giddiness: Secondary | ICD-10-CM | POA: Insufficient documentation

## 2023-01-04 DIAGNOSIS — R296 Repeated falls: Secondary | ICD-10-CM

## 2023-01-04 DIAGNOSIS — Z79899 Other long term (current) drug therapy: Secondary | ICD-10-CM | POA: Diagnosis not present

## 2023-01-04 DIAGNOSIS — Z7984 Long term (current) use of oral hypoglycemic drugs: Secondary | ICD-10-CM | POA: Diagnosis not present

## 2023-01-04 DIAGNOSIS — Z951 Presence of aortocoronary bypass graft: Secondary | ICD-10-CM | POA: Diagnosis not present

## 2023-01-04 LAB — COMPREHENSIVE METABOLIC PANEL
ALT: 33 U/L (ref 0–44)
AST: 31 U/L (ref 15–41)
Albumin: 2.4 g/dL — ABNORMAL LOW (ref 3.5–5.0)
Alkaline Phosphatase: 107 U/L (ref 38–126)
Anion gap: 5 (ref 5–15)
BUN: 28 mg/dL — ABNORMAL HIGH (ref 8–23)
CO2: 25 mmol/L (ref 22–32)
Calcium: 8.3 mg/dL — ABNORMAL LOW (ref 8.9–10.3)
Chloride: 108 mmol/L (ref 98–111)
Creatinine, Ser: 2.07 mg/dL — ABNORMAL HIGH (ref 0.61–1.24)
GFR, Estimated: 32 mL/min — ABNORMAL LOW (ref >60)
Glucose, Bld: 177 mg/dL — ABNORMAL HIGH (ref 70–99)
Potassium: 4.4 mmol/L (ref 3.5–5.1)
Sodium: 138 mmol/L (ref 135–145)
Total Bilirubin: 0.9 mg/dL (ref 0.3–1.2)
Total Protein: 5.4 g/dL — ABNORMAL LOW (ref 6.5–8.1)

## 2023-01-04 LAB — CBC WITH DIFFERENTIAL/PLATELET
Abs Immature Granulocytes: 0.54 10*3/uL — ABNORMAL HIGH (ref 0.00–0.07)
Basophils Absolute: 0.2 10*3/uL — ABNORMAL HIGH (ref 0.0–0.1)
Basophils Relative: 1 %
Eosinophils Absolute: 0.2 10*3/uL (ref 0.0–0.5)
Eosinophils Relative: 2 %
HCT: 35.9 % — ABNORMAL LOW (ref 39.0–52.0)
Hemoglobin: 11.2 g/dL — ABNORMAL LOW (ref 13.0–17.0)
Immature Granulocytes: 4 %
Lymphocytes Relative: 17 %
Lymphs Abs: 2.2 10*3/uL (ref 0.7–4.0)
MCH: 28.7 pg (ref 26.0–34.0)
MCHC: 31.2 g/dL (ref 30.0–36.0)
MCV: 92.1 fL (ref 80.0–100.0)
Monocytes Absolute: 1.4 10*3/uL — ABNORMAL HIGH (ref 0.1–1.0)
Monocytes Relative: 11 %
Neutro Abs: 8.1 10*3/uL — ABNORMAL HIGH (ref 1.7–7.7)
Neutrophils Relative %: 65 %
Platelets: 318 10*3/uL (ref 150–400)
RBC: 3.9 MIL/uL — ABNORMAL LOW (ref 4.22–5.81)
RDW: 14.9 % (ref 11.5–15.5)
WBC: 12.5 10*3/uL — ABNORMAL HIGH (ref 4.0–10.5)
nRBC: 0 % (ref 0.0–0.2)

## 2023-01-04 LAB — PROTIME-INR
INR: 1.6 — ABNORMAL HIGH (ref 0.8–1.2)
Prothrombin Time: 19.3 seconds — ABNORMAL HIGH (ref 11.4–15.2)

## 2023-01-04 MED ORDER — APIXABAN 5 MG PO TABS
5.0000 mg | ORAL_TABLET | Freq: Two times a day (BID) | ORAL | Status: DC
  Administered 2023-01-04: 5 mg via ORAL
  Filled 2023-01-04: qty 1

## 2023-01-04 MED ORDER — DULOXETINE HCL 30 MG PO CPEP
90.0000 mg | ORAL_CAPSULE | Freq: Every morning | ORAL | Status: DC
  Administered 2023-01-04: 90 mg via ORAL
  Filled 2023-01-04: qty 3

## 2023-01-04 MED ORDER — BUDESONIDE 0.25 MG/2ML IN SUSP
0.2500 mg | Freq: Two times a day (BID) | RESPIRATORY_TRACT | Status: DC
  Administered 2023-01-04: 0.25 mg via RESPIRATORY_TRACT
  Filled 2023-01-04: qty 2

## 2023-01-04 MED ORDER — TAMSULOSIN HCL 0.4 MG PO CAPS
0.4000 mg | ORAL_CAPSULE | Freq: Every day | ORAL | Status: DC
  Administered 2023-01-04: 0.4 mg via ORAL
  Filled 2023-01-04: qty 1

## 2023-01-04 MED ORDER — BUPROPION HCL ER (XL) 150 MG PO TB24
150.0000 mg | ORAL_TABLET | Freq: Every morning | ORAL | Status: DC
  Administered 2023-01-04: 150 mg via ORAL
  Filled 2023-01-04: qty 1

## 2023-01-04 MED ORDER — PANTOPRAZOLE SODIUM 40 MG PO TBEC
40.0000 mg | DELAYED_RELEASE_TABLET | Freq: Every morning | ORAL | Status: DC
  Administered 2023-01-04: 40 mg via ORAL
  Filled 2023-01-04: qty 1

## 2023-01-04 MED ORDER — OXYCODONE HCL 5 MG PO TABS
5.0000 mg | ORAL_TABLET | ORAL | Status: DC | PRN
  Administered 2023-01-04: 5 mg via ORAL
  Filled 2023-01-04: qty 1

## 2023-01-04 NOTE — ED Notes (Signed)
[employer] called. This RN spoke to Prosperity who stated patient's most recent dose of  5 mg of Eliquis was at 1950 on 01/03/23

## 2023-01-04 NOTE — ED Provider Notes (Signed)
[place] EMERGENCY DEPARTMENT AT [facility] Provider Note   CSN: [id number] Arrival date & time: 01/04/23  0321     History  Chief Complaint  Patient presents with   Fall    Patient o ED via EMS with complaint of unwitnessed fall at [facility]. Patient has no complaint of any injuries    Fernando Hess is a 77 y.o. male.  77 y/o male with PMH significant for DM2, HTN, HLD, CAD s/p CABG, PAF on Eliquis, chronic diastolic CHF, CKD, depression/anxiety, cognitive impairment presents to the ED from SNF. Per wife, she called the facility at 2AM because she was unable to sleep.  Was notified by nursing staff at the facility that patient was found on the floor next to his bed around 2330.  He was presumed to have had an unwitnessed fall.  Patient presently complains only of right hip pain.  He states that he has had similar right hip pain over the past 4 years. He is notably 9 days s/p right hip hemiarthroplasty; discharged to SNF on 01/01/23.  Denies headache, chest pain, SOB, N/V.  Does report some lightheadedness upon standing. Wife reports that she was with him at his facility for a number of hours and he appeared to be disoriented with trouble formulating a sentence.  She states that his "arms and legs were flapping" and movements were "uncontrollable" while the patient was apparently sleeping.   Discharged to SNF on 3L oxygen via [place] for chronic respiratory failure presumed 2/2 COPD. CT PE study while inpatient was negative. He remains chronically anticoagulated on Eliquis.  The history is provided by the patient, the EMS personnel and the spouse. No language interpreter was used.  Fall       Home Medications Prior to Admission medications   Medication Sig Start Date End Date Taking? Authorizing Provider  acetaminophen (TYLENOL) 500 MG tablet Take 2 tablets (1,000 mg total) by mouth every 8 (eight) hours as needed. 12/26/22 01/25/23  Cecil Cobbs, [name]  apixaban  (ELIQUIS) 5 MG TABS tablet Take 1 tablet (5 mg total) by mouth 2 (two) times daily. 01/01/23   Lorin Glass, MD  atorvastatin (LIPITOR) 80 MG tablet TAKE ONE TABLET BY MOUTH EVERY EVENING 11/13/22   Weaver, Scott T, [name]  B-D UF III MINI PEN NEEDLES 31G X 5 MM MISC USE AS DIRECTED WITH LANTUS SOLOSTAR 04/02/19   [provider]  budesonide (PULMICORT) 0.25 MG/2ML nebulizer solution Take 2 mLs (0.25 mg total) by nebulization 2 (two) times daily. 01/01/23   Lorin Glass, MD  buPROPion (WELLBUTRIN XL) 150 MG 24 hr tablet Take 150 mg by mouth every morning. 12/10/22   [provider]  Continuous Blood Gluc Sensor (FREESTYLE LIBRE 2 SENSOR) MISC Inject 1 Device into the skin every 14 (fourteen) days.    [provider]  DULoxetine (CYMBALTA) 30 MG capsule Take 3 capsules (90 mg total) by mouth every morning. 01/02/23   Lorin Glass, MD  feeding supplement, GLUCERNA SHAKE, (GLUCERNA SHAKE) LIQD Take 237 mLs by mouth 3 (three) times daily between meals. 01/01/23   Dahal, Melina Schools, MD  insulin aspart (NOVOLOG) 100 UNIT/ML injection Inject 0-15 Units into the skin 3 (three) times daily with meals. 01/01/23   Dahal, Melina Schools, MD  insulin aspart (NOVOLOG) 100 UNIT/ML injection Inject 0-5 Units into the skin at bedtime. 01/01/23   Lorin Glass, MD  Insulin Glargine (BASAGLAR KWIKPEN) 100 UNIT/ML SOPN Inject 40 Units into the skin in the morning.  [provider]  MAGNESIUM PO Take 350 mg by mouth every evening.    [provider]  Multiple Vitamins-Minerals (SENIOR MULTIVITAMIN PLUS PO) Take 1 tablet by mouth daily with breakfast.    [provider]  nitroGLYCERIN (NITROSTAT) 0.4 MG SL tablet Place 0.4 mg under the tongue every 5 (five) minutes as needed for chest pain.    [provider]  nystatin (MYCOSTATIN/NYSTOP) powder Apply 1 Application topically 2 (two) times daily as needed (skin irritation). Apply to the groin in the morning and evening     [provider]  nystatin cream (MYCOSTATIN) Apply 1 Application topically 2 (two) times daily as needed for dry skin. Apply to the groin in the morning and evening when not using the nystatin powder    [provider]  oxyCODONE (ROXICODONE) 5 MG immediate release tablet Take 1 tablet (5 mg total) by mouth every 4 (four) hours as needed for up to 7 days for severe pain or moderate pain. 01/01/23 01/08/23  Freeman Caldron, [name]  pantoprazole (PROTONIX) 40 MG tablet TAKE 1 TABLET BY MOUTH EVERY DAY Patient taking differently: Take 40 mg by mouth in the morning. 11/25/19   [name], MD  polyethylene glycol (MIRALAX / GLYCOLAX) 17 g packet Take 17 g by mouth daily as needed for mild constipation. Patient not taking: Reported on 12/24/2022 09/22/22   Osvaldo Shipper, MD  QUEtiapine (SEROQUEL) 50 MG tablet Take 1 tablet (50 mg total) by mouth 2 (two) times daily. Patient taking differently: Take 50 mg by mouth daily. 09/23/22   Osvaldo Shipper, MD  senna-docusate (SENOKOT-S) 8.6-50 MG tablet Take 1 tablet by mouth at bedtime as needed for mild constipation. 01/01/23   Lorin Glass, MD  tamsulosin (FLOMAX) 0.4 MG CAPS capsule Take 1 capsule (0.4 mg total) by mouth daily after breakfast. 01/02/23   Dahal, Melina Schools, MD      Allergies    Cilostazol, Dulaglutide, Levofloxacin, Liraglutide, and Lisinopril    Review of Systems   Review of Systems Ten systems reviewed and are negative for acute change, except as noted in the HPI.    Physical Exam Updated Vital Signs BP (!) 134/106   Pulse 89   Temp 98.4 F (36.9 C)   Resp 17   Ht  (1.676 m)   Wt 69 kg   SpO2 100%   BMI 24.55 kg/m   Physical Exam Vitals and nursing note reviewed.  Constitutional:      General: He is not in acute distress.    Appearance: He is well-developed. He is not diaphoretic.     Comments: Chronically ill appearing, nontoxic.  HENT:     Head: Normocephalic and atraumatic.  Eyes:     General: No  scleral icterus.    Conjunctiva/sclera: Conjunctivae normal.  Cardiovascular:     Rate and Rhythm: Normal rate and regular rhythm.     Pulses: Normal pulses.     Heart sounds: Murmur heard.  Pulmonary:     Effort: Pulmonary effort is normal. No respiratory distress.     Breath sounds: No stridor. No wheezing.     Comments: Respirations even and unlabored. O2 100% on 3L via [place]. Abdominal:     Palpations: Abdomen is soft.     Tenderness: There is no abdominal tenderness.  Genitourinary:    Comments: Hematuria in foley catheter Musculoskeletal:        General: Normal range of motion.     Cervical back: Normal range of motion.  Comments: No leg shortening or malrotation. Bandage to R hip surgical site appears C/D/I.  Skin:    General: Skin is warm and dry.     Coloration: Skin is not pale.     Findings: No erythema or rash.  Neurological:     Mental Status: He is alert and oriented to person, place, and time.     Coordination: Coordination normal.  Psychiatric:        Behavior: Behavior normal.     ED Results / Procedures / Treatments   Labs (all labs ordered are listed, but only abnormal results are displayed) Labs Reviewed  CBC WITH DIFFERENTIAL/PLATELET  COMPREHENSIVE METABOLIC PANEL  PROTIME-INR    EKG None  Radiology CT HEAD WO CONTRAST ( )  Result Date: 01/04/2023 CLINICAL DATA:  77 year old male with history of head trauma from a fall. EXAM: CT HEAD WITHOUT CONTRAST TECHNIQUE: Contiguous axial images were obtained from the base of the skull through the vertex without intravenous contrast. RADIATION DOSE REDUCTION: This exam was performed according to the departmental dose-optimization program which includes automated exposure control, adjustment of the mA and/or kV according to patient size and/or use of iterative reconstruction technique. COMPARISON:  Head CT 12/28/2022. FINDINGS: Brain: Mild cerebral and cerebellar atrophy. Patchy and confluent areas of  decreased attenuation are noted throughout the deep and periventricular white matter of the cerebral hemispheres bilaterally, compatible with chronic microvascular ischemic disease. No evidence of acute infarction, hemorrhage, hydrocephalus, extra-axial collection or mass lesion/mass effect. Vascular: No hyperdense vessel or unexpected calcification. Skull: Normal. Negative for fracture or focal lesion. Sinuses/Orbits: No acute finding. Multiple small mucosal retention cysts or polyps noted in the right maxillary sinus. Other: None. IMPRESSION: 1. No evidence of significant acute traumatic injury to the skull or brain. 2. Mild cerebral and cerebellar atrophy with chronic microvascular ischemic changes in the cerebral white matter, as above. Electronically Signed   By: Trudie Reed M.D.   On: 01/04/2023 05:15   DG Hip Unilat W or Wo Pelvis 2-3 Views Right  Result Date: 01/04/2023 CLINICAL DATA:  77 year old male status post fall with right hip pain. EXAM: DG HIP (WITH OR WITHOUT PELVIS) 2-3V RIGHT COMPARISON:  Right hip series earlier this month 12/26/2022. CT Chest, Abdomen, and Pelvis today are reported separately. 10/12/2022 FINDINGS: Chronic bipolar right hip arthroplasty, although new since January. Pelvis appears stable and intact. Left femoral head remains normally located, grossly intact proximal left femur. Right femur arthroplasty components appear stable, intact. No periprosthetic fracture identified, proximal right femur otherwise intact. Negative visible bowel gas. IMPRESSION: Stable right bipolar hip arthroplasty. No acute fracture or dislocation identified about the right hip or pelvis. Electronically Signed   By: Odessa Fleming M.D.   On: 01/04/2023 04:53   DG Chest 1 View  Result Date: 01/04/2023 CLINICAL DATA:  77 year old male status post fall with right hip pain. EXAM: CHEST  1 VIEW COMPARISON:  Chest CT 12/29/2022 and earlier. FINDINGS: Portable AP semi upright view at 0429 hours. Lordotic  positioning. Stable lung volumes and mediastinal contours. Chronic CABG. Allowing for portable technique the lungs are clear. No pneumothorax or pleural effusion identified. Visualized tracheal air column is within normal limits. Osteopenia. Negative visible bowel gas. IMPRESSION: No acute cardiopulmonary abnormality. Electronically Signed   By: Odessa Fleming M.D.   On: 01/04/2023 04:50    Procedures Procedures    Medications Ordered in ED Medications  buPROPion (WELLBUTRIN XL) 24 hr tablet 150 mg (has no administration in time range)  pantoprazole (PROTONIX)  EC tablet 40 mg (has no administration in time range)  oxyCODONE (Oxy IR/ROXICODONE) immediate release tablet 5 mg (has no administration in time range)  tamsulosin (FLOMAX) capsule 0.4 mg (has no administration in time range)  budesonide (PULMICORT) nebulizer solution 0.25 mg (has no administration in time range)  apixaban (ELIQUIS) tablet 5 mg (has no administration in time range)  DULoxetine (CYMBALTA) DR capsule 90 mg (has no administration in time range)    ED Course/ Medical Decision Making/ A&P                             Medical Decision Making Amount and/or Complexity of Data Reviewed Labs: ordered. Radiology: ordered. ECG/medicine tests: ordered.  Risk Prescription drug management.   This patient presents to the ED for concern of unwitnessed fall, this involves an extensive number of treatment options, and is a complaint that carries with it a high risk of complications and morbidity.  The differential diagnosis includes syncope vs mechanical fall vs ICH   Co morbidities that complicate the patient evaluation  DM HTN HLD Chronic anticoagulation, on Eliquis   Additional history obtained:  Additional history obtained from spouse, at bedside External records from outside source obtained and reviewed including prior pulmonary perfusion scan on 12/29/22 which was negative   Lab Tests:  I Ordered, and personally  interpreted labs.  The pertinent results include:  CBC, CMP, PT/INR   Imaging Studies ordered:  I ordered imaging studies including CXR, hip/pelvis Xray, CT head  I independently visualized and interpreted imaging which showed no acute or traumatic pathology I agree with the radiologist interpretation   Medicines ordered and prescription drug management:  I ordered medication including oxycodone for RLE pain 2/2 prior hip surgery  Reevaluation of the patient after these medicines showed that the patient  remained stable I have reviewed the patients home medicines and have made adjustments as needed   Problem List / ED Course:  Patient presented being after unwitnessed fall at skilled nursing facility.  He has no outward signs of trauma on exam.  His head CT, chest x-ray, pelvis and hip films are reassuring. Spouse expresses concern for ongoing care received at patient's skilled nursing facility.  She states that he was not acting himself when he was there yesterday.  There is no present concern for infection as the patient is afebrile without criteria for SIRS/sepsis.  He is presently awake and alert as well as oriented. I have ordered basic labs for trending for reassurance to the patient's spouse.  This may also reveal a potential abnormality that may have incited his unwitnessed fall tonight.  EKG ordered, also pending completion. Patient currently without acute complaints.   Reevaluation:  After the interventions noted above, I reevaluated the patient and found that they have :stayed the same   Social Determinants of Health:  Chronic O2 utilizer   Dispostion:  Care signed out to [place], [place] at shift change.         Final Clinical Impression(s) / ED Diagnoses Final diagnoses:  Unwitnessed fall    Rx / DC Orders ED Discharge Orders     None         Antony Madura, [name] 01/04/23 0636    Sabas Sous, MD 01/04/23 [phone]

## 2023-01-04 NOTE — Discharge Instructions (Signed)
Your workup today was reassuring.  No evidence of fracture on x-rays.  Blood work was reassuring.  For any concerning symptoms return to the emergency room.

## 2023-01-04 NOTE — ED Provider Notes (Signed)
Signout received on this 77 year old male who had an unwitnessed fall at the skilled nursing facility.  Please see note from signout provider for full details.  At the time of signout patient is awaiting consult with transitions of care and basic labs to ensure there is no electrolyte derangement, or other acute abnormality that may warrant admission.  If the labs are normal and following transitions of care consult patient would be appropriate to discharge back to facility.  Physical Exam  BP (!) 134/106   Pulse 89   Temp 98.4 F (36.9 C)   Resp 17   Ht  (1.676 m)   Wt 69 kg   SpO2 100%   BMI 24.55 kg/m     Procedures  Procedures  ED Course / MDM    Medical Decision Making Amount and/or Complexity of Data Reviewed Labs: ordered. Radiology: ordered. ECG/medicine tests: ordered.  Risk Prescription drug management.   Labs at baseline and reassuring.  Foot x-ray without evidence of acute fracture.  Transition of care team spoke with patient and wife.  They voiced their concerns to TOC.  They are in agreement to return to the facility and facilitate expectations and facility change if needed through their current facility.       Marita Kansas, [name] 01/04/23 [id number]    Wynetta Fines, MD 01/04/23 [phone]

## 2023-01-04 NOTE — ED Notes (Signed)
Called [facility] to transport patient to [facility]

## 2023-01-04 NOTE — Progress Notes (Signed)
CSW spoke with Gearldine Bienenstock at [facility] to confirm patient can return. The number to call for report is [phone].   RN will call [facility] and report when ready.  Edwin Dada, MSW, LCSW Transitions of Care  Clinical Social Worker II [phone]

## 2023-01-04 NOTE — Care Management (Addendum)
Wife called in and  voiced concern about the care at [facility], Talking about patient mumbling , worse Had a nurse that found him on the floor. She had been there before supper yesterday, and the nurse did not know why he was there. She feels they are incompetent, she didn't  call  when  the patient fell. The RN stated she checked him over and said he was fine, however she insisted on calling EMS to have him checked out. Ms Fehnel wanted to speak to CSW, CSW aware. Spoke t Mrs. Wilshire. She is aware that he will go back to [facility]. Encouraged her to meet with director on Monday to get an understanding of expectations.

## 2023-01-05 ENCOUNTER — Ambulatory Visit (HOSPITAL_BASED_OUTPATIENT_CLINIC_OR_DEPARTMENT_OTHER): Payer: Medicare Other | Admitting: Physical Therapy

## 2023-01-07 ENCOUNTER — Encounter (HOSPITAL_COMMUNITY): Payer: Self-pay | Admitting: Emergency Medicine

## 2023-01-07 ENCOUNTER — Inpatient Hospital Stay (HOSPITAL_COMMUNITY)
Admission: EM | Admit: 2023-01-07 | Discharge: 2023-01-12 | DRG: 092 | Disposition: A | Discharge status: Skilled Nursing Facility | Payer: Medicare Other | Source: Skilled Nursing Facility | Attending: Family Medicine | Admitting: Family Medicine

## 2023-01-07 ENCOUNTER — Other Ambulatory Visit: Payer: Self-pay

## 2023-01-07 ENCOUNTER — Emergency Department (HOSPITAL_COMMUNITY): Payer: Medicare Other

## 2023-01-07 DIAGNOSIS — N179 Acute kidney failure, unspecified: Secondary | ICD-10-CM | POA: Diagnosis present

## 2023-01-07 DIAGNOSIS — E1122 Type 2 diabetes mellitus with diabetic chronic kidney disease: Secondary | ICD-10-CM | POA: Diagnosis present

## 2023-01-07 DIAGNOSIS — Z1152 Encounter for screening for COVID-19: Secondary | ICD-10-CM

## 2023-01-07 DIAGNOSIS — Z7951 Long term (current) use of inhaled steroids: Secondary | ICD-10-CM

## 2023-01-07 DIAGNOSIS — G4733 Obstructive sleep apnea (adult) (pediatric): Secondary | ICD-10-CM | POA: Diagnosis present

## 2023-01-07 DIAGNOSIS — I4510 Unspecified right bundle-branch block: Secondary | ICD-10-CM | POA: Diagnosis present

## 2023-01-07 DIAGNOSIS — S72001A Fracture of unspecified part of neck of right femur, initial encounter for closed fracture: Secondary | ICD-10-CM | POA: Diagnosis present

## 2023-01-07 DIAGNOSIS — T43595A Adverse effect of other antipsychotics and neuroleptics, initial encounter: Secondary | ICD-10-CM | POA: Diagnosis present

## 2023-01-07 DIAGNOSIS — E1169 Type 2 diabetes mellitus with other specified complication: Secondary | ICD-10-CM

## 2023-01-07 DIAGNOSIS — E113392 Type 2 diabetes mellitus with moderate nonproliferative diabetic retinopathy without macular edema, left eye: Secondary | ICD-10-CM | POA: Diagnosis present

## 2023-01-07 DIAGNOSIS — K219 Gastro-esophageal reflux disease without esophagitis: Secondary | ICD-10-CM | POA: Diagnosis present

## 2023-01-07 DIAGNOSIS — Z881 Allergy status to other antibiotic agents status: Secondary | ICD-10-CM

## 2023-01-07 DIAGNOSIS — F418 Other specified anxiety disorders: Secondary | ICD-10-CM | POA: Diagnosis present

## 2023-01-07 DIAGNOSIS — F03918 Unspecified dementia, unspecified severity, with other behavioral disturbance: Secondary | ICD-10-CM | POA: Diagnosis present

## 2023-01-07 DIAGNOSIS — I5030 Unspecified diastolic (congestive) heart failure: Secondary | ICD-10-CM | POA: Diagnosis present

## 2023-01-07 DIAGNOSIS — Z9181 History of falling: Secondary | ICD-10-CM

## 2023-01-07 DIAGNOSIS — N529 Male erectile dysfunction, unspecified: Secondary | ICD-10-CM | POA: Diagnosis present

## 2023-01-07 DIAGNOSIS — R4701 Aphasia: Secondary | ICD-10-CM | POA: Diagnosis present

## 2023-01-07 DIAGNOSIS — S72001D Fracture of unspecified part of neck of right femur, subsequent encounter for closed fracture with routine healing: Secondary | ICD-10-CM | POA: Diagnosis not present

## 2023-01-07 DIAGNOSIS — I2510 Atherosclerotic heart disease of native coronary artery without angina pectoris: Secondary | ICD-10-CM | POA: Diagnosis not present

## 2023-01-07 DIAGNOSIS — E860 Dehydration: Secondary | ICD-10-CM | POA: Diagnosis present

## 2023-01-07 DIAGNOSIS — R404 Transient alteration of awareness: Principal | ICD-10-CM

## 2023-01-07 DIAGNOSIS — F0393 Unspecified dementia, unspecified severity, with mood disturbance: Secondary | ICD-10-CM | POA: Diagnosis present

## 2023-01-07 DIAGNOSIS — T428X5A Adverse effect of antiparkinsonism drugs and other central muscle-tone depressants, initial encounter: Secondary | ICD-10-CM | POA: Diagnosis present

## 2023-01-07 DIAGNOSIS — R4182 Altered mental status, unspecified: Secondary | ICD-10-CM | POA: Diagnosis not present

## 2023-01-07 DIAGNOSIS — Z91199 Patient's noncompliance with other medical treatment and regimen due to unspecified reason: Secondary | ICD-10-CM

## 2023-01-07 DIAGNOSIS — G928 Other toxic encephalopathy: Secondary | ICD-10-CM | POA: Diagnosis not present

## 2023-01-07 DIAGNOSIS — G9340 Encephalopathy, unspecified: Secondary | ICD-10-CM | POA: Diagnosis not present

## 2023-01-07 DIAGNOSIS — R0902 Hypoxemia: Secondary | ICD-10-CM | POA: Diagnosis present

## 2023-01-07 DIAGNOSIS — K5900 Constipation, unspecified: Secondary | ICD-10-CM | POA: Diagnosis present

## 2023-01-07 DIAGNOSIS — R3121 Asymptomatic microscopic hematuria: Secondary | ICD-10-CM

## 2023-01-07 DIAGNOSIS — Z7901 Long term (current) use of anticoagulants: Secondary | ICD-10-CM

## 2023-01-07 DIAGNOSIS — Z833 Family history of diabetes mellitus: Secondary | ICD-10-CM

## 2023-01-07 DIAGNOSIS — E213 Hyperparathyroidism, unspecified: Secondary | ICD-10-CM | POA: Diagnosis present

## 2023-01-07 DIAGNOSIS — Z794 Long term (current) use of insulin: Secondary | ICD-10-CM

## 2023-01-07 DIAGNOSIS — I25810 Atherosclerosis of coronary artery bypass graft(s) without angina pectoris: Secondary | ICD-10-CM | POA: Diagnosis present

## 2023-01-07 DIAGNOSIS — Z8249 Family history of ischemic heart disease and other diseases of the circulatory system: Secondary | ICD-10-CM

## 2023-01-07 DIAGNOSIS — I252 Old myocardial infarction: Secondary | ICD-10-CM

## 2023-01-07 DIAGNOSIS — I480 Paroxysmal atrial fibrillation: Secondary | ICD-10-CM | POA: Diagnosis not present

## 2023-01-07 DIAGNOSIS — Z961 Presence of intraocular lens: Secondary | ICD-10-CM | POA: Diagnosis present

## 2023-01-07 DIAGNOSIS — N400 Enlarged prostate without lower urinary tract symptoms: Secondary | ICD-10-CM | POA: Diagnosis present

## 2023-01-07 DIAGNOSIS — I77810 Thoracic aortic ectasia: Secondary | ICD-10-CM | POA: Diagnosis present

## 2023-01-07 DIAGNOSIS — Z79899 Other long term (current) drug therapy: Secondary | ICD-10-CM

## 2023-01-07 DIAGNOSIS — E119 Type 2 diabetes mellitus without complications: Secondary | ICD-10-CM

## 2023-01-07 DIAGNOSIS — Z9842 Cataract extraction status, left eye: Secondary | ICD-10-CM

## 2023-01-07 DIAGNOSIS — Z87440 Personal history of urinary (tract) infections: Secondary | ICD-10-CM

## 2023-01-07 DIAGNOSIS — I130 Hypertensive heart and chronic kidney disease with heart failure and stage 1 through stage 4 chronic kidney disease, or unspecified chronic kidney disease: Secondary | ICD-10-CM | POA: Diagnosis present

## 2023-01-07 DIAGNOSIS — E1165 Type 2 diabetes mellitus with hyperglycemia: Secondary | ICD-10-CM | POA: Diagnosis present

## 2023-01-07 DIAGNOSIS — Z96641 Presence of right artificial hip joint: Secondary | ICD-10-CM | POA: Diagnosis present

## 2023-01-07 DIAGNOSIS — I5032 Chronic diastolic (congestive) heart failure: Secondary | ICD-10-CM | POA: Diagnosis present

## 2023-01-07 DIAGNOSIS — E1121 Type 2 diabetes mellitus with diabetic nephropathy: Secondary | ICD-10-CM

## 2023-01-07 DIAGNOSIS — E785 Hyperlipidemia, unspecified: Secondary | ICD-10-CM | POA: Diagnosis present

## 2023-01-07 DIAGNOSIS — E1151 Type 2 diabetes mellitus with diabetic peripheral angiopathy without gangrene: Secondary | ICD-10-CM | POA: Diagnosis present

## 2023-01-07 DIAGNOSIS — F0394 Unspecified dementia, unspecified severity, with anxiety: Secondary | ICD-10-CM | POA: Diagnosis present

## 2023-01-07 DIAGNOSIS — Z888 Allergy status to other drugs, medicaments and biological substances status: Secondary | ICD-10-CM

## 2023-01-07 DIAGNOSIS — R471 Dysarthria and anarthria: Secondary | ICD-10-CM | POA: Diagnosis present

## 2023-01-07 DIAGNOSIS — Z9841 Cataract extraction status, right eye: Secondary | ICD-10-CM

## 2023-01-07 DIAGNOSIS — I6523 Occlusion and stenosis of bilateral carotid arteries: Secondary | ICD-10-CM | POA: Diagnosis present

## 2023-01-07 DIAGNOSIS — N281 Cyst of kidney, acquired: Secondary | ICD-10-CM | POA: Diagnosis present

## 2023-01-07 DIAGNOSIS — Z87442 Personal history of urinary calculi: Secondary | ICD-10-CM

## 2023-01-07 DIAGNOSIS — R319 Hematuria, unspecified: Secondary | ICD-10-CM | POA: Diagnosis present

## 2023-01-07 DIAGNOSIS — E869 Volume depletion, unspecified: Secondary | ICD-10-CM | POA: Diagnosis present

## 2023-01-07 DIAGNOSIS — F32A Depression, unspecified: Secondary | ICD-10-CM | POA: Diagnosis present

## 2023-01-07 DIAGNOSIS — N1832 Chronic kidney disease, stage 3b: Secondary | ICD-10-CM | POA: Diagnosis present

## 2023-01-07 DIAGNOSIS — M25572 Pain in left ankle and joints of left foot: Secondary | ICD-10-CM | POA: Diagnosis present

## 2023-01-07 LAB — COMPREHENSIVE METABOLIC PANEL
ALT: 31 U/L (ref 0–44)
AST: 39 U/L (ref 15–41)
Albumin: 2.4 g/dL — ABNORMAL LOW (ref 3.5–5.0)
Alkaline Phosphatase: 128 U/L — ABNORMAL HIGH (ref 38–126)
Anion gap: 10 (ref 5–15)
BUN: 26 mg/dL — ABNORMAL HIGH (ref 8–23)
CO2: 26 mmol/L (ref 22–32)
Calcium: 8.6 mg/dL — ABNORMAL LOW (ref 8.9–10.3)
Chloride: 103 mmol/L (ref 98–111)
Creatinine, Ser: 2.1 mg/dL — ABNORMAL HIGH (ref 0.61–1.24)
GFR, Estimated: 32 mL/min — ABNORMAL LOW (ref >60)
Glucose, Bld: 286 mg/dL — ABNORMAL HIGH (ref 70–99)
Potassium: 5 mmol/L (ref 3.5–5.1)
Sodium: 139 mmol/L (ref 135–145)
Total Bilirubin: 0.9 mg/dL (ref 0.3–1.2)
Total Protein: 5.2 g/dL — ABNORMAL LOW (ref 6.5–8.1)

## 2023-01-07 LAB — CBC WITH DIFFERENTIAL/PLATELET
Abs Immature Granulocytes: 0.18 10*3/uL — ABNORMAL HIGH (ref 0.00–0.07)
Basophils Absolute: 0.1 10*3/uL (ref 0.0–0.1)
Basophils Relative: 1 %
Eosinophils Absolute: 0.3 10*3/uL (ref 0.0–0.5)
Eosinophils Relative: 3 %
HCT: 34.2 % — ABNORMAL LOW (ref 39.0–52.0)
Hemoglobin: 11 g/dL — ABNORMAL LOW (ref 13.0–17.0)
Immature Granulocytes: 2 %
Lymphocytes Relative: 16 %
Lymphs Abs: 1.6 10*3/uL (ref 0.7–4.0)
MCH: 29 pg (ref 26.0–34.0)
MCHC: 32.2 g/dL (ref 30.0–36.0)
MCV: 90.2 fL (ref 80.0–100.0)
Monocytes Absolute: 1.1 10*3/uL — ABNORMAL HIGH (ref 0.1–1.0)
Monocytes Relative: 11 %
Neutro Abs: 6.8 10*3/uL (ref 1.7–7.7)
Neutrophils Relative %: 67 %
Platelets: 399 10*3/uL (ref 150–400)
RBC: 3.79 MIL/uL — ABNORMAL LOW (ref 4.22–5.81)
RDW: 15.3 % (ref 11.5–15.5)
WBC: 10 10*3/uL (ref 4.0–10.5)
nRBC: 0 % (ref 0.0–0.2)

## 2023-01-07 LAB — RAPID URINE DRUG SCREEN, HOSP PERFORMED
Amphetamines: NOT DETECTED
Barbiturates: NOT DETECTED
Benzodiazepines: NOT DETECTED
Cocaine: NOT DETECTED
Opiates: NOT DETECTED
Tetrahydrocannabinol: NOT DETECTED

## 2023-01-07 LAB — ETHANOL: Alcohol, Ethyl (B): <10 mg/dL (ref <10)

## 2023-01-07 LAB — URINALYSIS, ROUTINE W REFLEX MICROSCOPIC
Bilirubin Urine: NEGATIVE
Glucose, UA: NEGATIVE mg/dL
Ketones, ur: NEGATIVE mg/dL
Nitrite: NEGATIVE
Protein, ur: 100 mg/dL — AB
RBC / HPF: >50 RBC/hpf (ref 0–5)
Specific Gravity, Urine: 1.018 (ref 1.005–1.030)
pH: 5 (ref 5.0–8.0)

## 2023-01-07 LAB — TROPONIN I (HIGH SENSITIVITY)
Troponin I (High Sensitivity): 14 ng/L (ref <18)
Troponin I (High Sensitivity): 14 ng/L (ref <18)

## 2023-01-07 LAB — CBG MONITORING, ED: Glucose-Capillary: 257 mg/dL — ABNORMAL HIGH (ref 70–99)

## 2023-01-07 LAB — MAGNESIUM: Magnesium: 2 mg/dL (ref 1.7–2.4)

## 2023-01-07 MED ORDER — QUETIAPINE FUMARATE 25 MG PO TABS
50.0000 mg | ORAL_TABLET | Freq: Every day | ORAL | Status: DC
  Administered 2023-01-07: 50 mg via ORAL
  Filled 2023-01-07: qty 2

## 2023-01-07 NOTE — Subjective & Objective (Signed)
Pt with Myasthenia Gravis had a recent hip fracture now at [facility] his wife today sounding altered   Pt appears confused  No complaints

## 2023-01-07 NOTE — H&P (Signed)
Fernando Hess:[id number] DOB: September 21, 1945 DOA: 01/07/2023     PCP: [name], MD   Outpatient Specialists: * NONE CARDS: * Dr. Meriam Sprague, MD  NEphrology: *  Dr. NEurology *   Dr. Pulmonary *  Dr.  Oncology * Dr. Sandria Manly* Dr.  Deboraha Sprang, [facility]) No care team member to display Urology Dr. *  Patient arrived to ER on 01/07/23 at 1624 Referred by Attending Loetta Rough, MD   Patient coming from:    home Lives alone,   *** With family From facility ***  Chief Complaint: *** Chief Complaint  Patient presents with   Altered Mental Status    HPI: Fernando Hess is a 77 y.o. male with medical history significant of ***     Presented with   * Pt with Myasthenia Gravis had a recent hip fracture now at [facility]  Called his wife today sounding altered   Pt appears confused  No complaints   Denies significant ETOH intake *** Does not smoke*** but interested in quitting***  Lab Results  Component Value Date   SARSCOV2NAA NEGATIVE 09/17/2022   SARSCOV2NAA NEGATIVE 09/04/2022   SARSCOV2NAA NEGATIVE 05/10/2021   SARSCOV2NAA NEGATIVE 06/25/2020    Regarding pertinent Chronic problems:    ****Hyperlipidemia - *on statins {statin:315258}  Lipid Panel     Component Value Date/Time   CHOL 118 09/18/2022 0023   CHOL 121 01/22/2022 0815   TRIG 97 09/18/2022 0023   HDL 33 (L) 09/18/2022 0023   HDL 42 01/22/2022 0815   CHOLHDL 3.6 09/18/2022 0023   VLDL 19 09/18/2022 0023   LDLCALC 66 09/18/2022 0023   LDLCALC 57 01/22/2022 0815   LABVLDL 22 01/22/2022 0815    ***HTN on   ***chronic CHF diastolic/systolic/ combined - last echo*** Recent Results (from the past 81191 hour(s))  ECHOCARDIOGRAM COMPLETE   Collection Time: 12/30/22 11:19 AM  Result Value   Weight 2,433.88   Height 66   BP 126/79   S' Lateral 2.80   AR max vel 1.44   AV Area VTI 1.40   AV Mean grad 11.0   AV Peak grad 20.3   Ao pk vel 2.25   Area-P 1/2 4.41   AV Area mean vel 1.35   Est  EF 60 - 65%   Narrative      ECHOCARDIOGRAM REPORT       Patient Name:   Fernando Hess Laraia Date of Exam: 12/30/2022 Medical Rec #:  [id number]      Height:       66.0 in Accession #:    [id number]     Weight:       152.1 lb Date of Birth:  June 23, 1946      BSA:          1.780 m Patient Age:    76 years       BP:           142/94 mmHg Patient Gender: M              HR:           102 bpm. Exam Location:  Inpatient  Procedure: 2D Echo, Cardiac Doppler, Color Doppler and Intracardiac            Opacification Agent  Indications:    Dyspnea R06.00   History:        Patient has prior history of Echocardiogram examinations, most  recent 05/10/2021. CAD, Prior CABG, Arrythmias:Atrial                 Fibrillation, Signs/Symptoms:Syncope; Risk Factors:Hypertension,                 Sleep Apnea, Diabetes and Dyslipidemia. CKD, stage 3.   Sonographer:    Lucendia Herrlich Referring Phys: [id number] [place]  IMPRESSIONS    1. Left ventricular ejection fraction, by estimation, is 60 to 65%. The left ventricle has normal function. The left ventricle has no regional wall motion abnormalities. There is moderate concentric left ventricular hypertrophy. Left ventricular  diastolic parameters are consistent with Grade I diastolic dysfunction (impaired relaxation).  2. Right ventricular systolic function is normal. The right ventricular size is normal.  3. The mitral valve is degenerative. Mild mitral valve regurgitation. No evidence of mitral stenosis.  4. Decreased LV stroke volume index. The aortic valve is tricuspid. There is moderate calcification of the aortic valve. Aortic valve regurgitation is mild. Mild aortic valve stenosis. Aortic valve mean gradient measures 11.0 mmHg.  5. The inferior vena cava is normal in size with greater than 50% respiratory variability, suggesting right atrial pressure of 3 mmHg.  Comparison(s): No significant change from prior study. Prior images  reviewed side by side.  FINDINGS  Left Ventricle: Left ventricular ejection fraction, by estimation, is 60 to 65%. The left ventricle has normal function. The left ventricle has no regional wall motion abnormalities. Definity contrast agent was given IV to delineate the left ventricular  endocardial borders. The left ventricular internal cavity size was normal in size. There is moderate concentric left ventricular hypertrophy. Left ventricular diastolic parameters are consistent with Grade I diastolic dysfunction (impaired relaxation).  Right Ventricle: The right ventricular size is normal. No increase in right ventricular wall thickness. Right ventricular systolic function is normal.  Left Atrium: Left atrial size was normal in size.  Right Atrium: Right atrial size was normal in size.  Pericardium: There is no evidence of pericardial effusion.  Mitral Valve: The mitral valve is degenerative in appearance. Mild mitral valve regurgitation. No evidence of mitral valve stenosis.  Tricuspid Valve: The tricuspid valve is normal in structure. Tricuspid valve regurgitation is not demonstrated. No evidence of tricuspid stenosis.  Aortic Valve: Decreased LV stroke volume index. The aortic valve is tricuspid. There is moderate calcification of the aortic valve. There is moderate aortic valve annular calcification. Aortic valve regurgitation is mild. Mild aortic stenosis is present.  Aortic valve mean gradient measures 11.0 mmHg. Aortic valve peak gradient measures 20.2 mmHg. Aortic valve area, by VTI measures 1.40 cm.  Pulmonic Valve: The pulmonic valve was normal in structure. Pulmonic valve regurgitation is not visualized. No evidence of pulmonic stenosis.  Aorta: The aortic root and ascending aorta are structurally normal, with no evidence of dilitation.  Venous: The inferior vena cava is normal in size with greater than 50% respiratory variability, suggesting right atrial pressure of 3  mmHg.  IAS/Shunts: The atrial septum is grossly normal.    LEFT VENTRICLE PLAX 2D LVIDd:         4.00 cm   Diastology LVIDs:         2.80 cm   LV e' medial:    5.59 cm/s LV PW:         1.40 cm   LV E/e' medial:  12.7 LV IVS:        1.40 cm   LV e' lateral:   10.10 cm/s LVOT diam:  1.90 cm   LV E/e' lateral: 7.1 LV SV:         48 LV SV Index:   27 LVOT Area:     2.84 cm    IVC IVC diam: 1.20 cm  LEFT ATRIUM             Index        RIGHT ATRIUM           Index LA diam:        5.00 cm 2.81 cm/m   RA Area:     12.20 cm LA Vol (A2C):   44.4 ml 24.94 ml/m  RA Volume:   21.60 ml  12.13 ml/m LA Vol (A4C):   36.5 ml 20.50 ml/m LA Biplane Vol: 41.1 ml 23.09 ml/m  AORTIC VALVE AV Area (Vmax):    1.44 cm AV Area (Vmean):   1.35 cm AV Area (VTI):     1.40 cm AV Vmax:           225.00 cm/s AV Vmean:          152.000 cm/s AV VTI:            0.342 m AV Peak Grad:      20.2 mmHg AV Mean Grad:      11.0 mmHg LVOT Vmax:         114.67 cm/s LVOT Vmean:        72.433 cm/s LVOT VTI:          0.169 m LVOT/AV VTI ratio: 0.49   AORTA Ao Root diam: 3.30 cm Ao Asc diam:  3.80 cm  MITRAL VALVE               TRICUSPID VALVE MV Area (PHT): 4.41 cm    TR Peak grad:   8.8 mmHg MV Decel Time: 172 msec    TR Vmax:        148.00 cm/s MV E velocity: 71.25 cm/s MV A velocity: 97.70 cm/s  SHUNTS MV E/A ratio:  0.73        Systemic VTI:  0.17 m                            Systemic Diam: 1.90 cm  Riley Lam MD Electronically signed by Riley Lam MD Signature Date/Time: 12/30/2022/12:31:30 PM       Final     *Note: Due to a large number of results and/or encounters for the requested time period, some results have not been displayed. A complete set of results can be found in Results Review.    *** CAD  - On Aspirin, statin, betablocker, Plavix                 - *followed by cardiology                - last cardiac cath       ***DM 2 -  Lab Results   Component Value Date   HGBA1C 10.7 (H) 09/18/2022   ****on insulin, PO meds only, diet controlled  ***Hypothyroidism:  Lab Results  Component Value Date   TSH 0.580 09/17/2022   on synthroid  *** Morbid obesity-   BMI Readings from Last 1 Encounters:  01/07/23 24.53 kg/m     *** Asthma -well *** controlled on home inhalers/ nebs                         ***  last no prior***admission  ***                       No ***history of intubation  *** COPD - not **followed by pulmonology *** not  on baseline oxygen  *L,    *** OSA -on nocturnal oxygen, *CPAP, *noncompliant with CPAP  *** Hx of CVA - *with/out residual deficits on Aspirin 81 mg, 325, Plavix  ***A. Fib -  - CHA2DS2 vas score **** CHA2DS2/VAS Stroke Risk Points  Current as of 13 minutes ago     8 >= 2 Points: High Risk  1 - 1.99 Points: Medium Risk  0 Points: Low Risk    Last Change: N/A      Details    This score determines the patient's risk of having a stroke if the  patient has atrial fibrillation.       Points Metrics  1 Has Congestive Heart Failure:  Yes    Current as of 13 minutes ago  1 Has Vascular Disease:  Yes    Current as of 13 minutes ago  1 Has Hypertension:  Yes    Current as of 13 minutes ago  2 Age:  38    Current as of 13 minutes ago  1 Has Diabetes:  Yes    Current as of 13 minutes ago  2 Had Stroke:  Yes  Had TIA:  Yes  Had Thromboembolism:  No    Current as of 13 minutes ago  0 Male:  No    Current as of 13 minutes ago           current  on anticoagulation with ****Coumadin  ***Xarelto,* Eliquis,  *** Not on anticoagulation secondary to Risk of Falls, *** recurrent bleeding         -  Rate control:  Currently controlled with ***Toprolol,  *Metoprolol,* Diltiazem, *Coreg          - Rhythm control: *** amiodarone, *flecainide  ***Hx of DVT/PE on - anticoagulation with ****Coumadin  ***Xarelto,* Eliquis,      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  Lab Results  Component Value Date   CREATININE 2.10 (H) 01/07/2023   CREATININE 2.07 (H) 01/04/2023   CREATININE 1.91 (H) 01/01/2023    Chronic anemia - baseline hg Hemoglobin & Hematocrit  Recent Labs    01/01/23 0316 01/04/23 0625 01/07/23 1638  HGB 11.7* 11.2* 11.0*   Iron/TIBC/Ferritin/ %Sat    Component Value Date/Time   IRON 7 (L) 12/29/2022 0203   TIBC 231 (L) 12/29/2022 0203   FERRITIN 21 (L) 10/04/2009 1443   IRONPCTSAT 3 (L) 12/29/2022 0203       While in ER: Clinical Course as of 01/07/23 2332  Wed Jan 07, 2023  1658 Glucose-Capillary(!): 257 [HN]  1749 Creatinine(!): 2.10 BL ~2 [HN]  1749 WBC: 10.0 No leukocytosis [HN]  1749 Hemoglobin(!): 11.0 C/w priors [HN]  1749 Troponin I (High Sensitivity): 14 [HN]  1749 Magnesium: 2.0 [HN]  1859 CT Head Wo Contrast No acute intracranial process. [HN]    Clinical Course User Index [HN] Loetta Rough, MD         Lab Orders         Urine Culture         CBC with Differential         Comprehensive metabolic panel  Ethanol         Rapid urine drug screen (hospital performed)         Urinalysis, Routine w reflex microscopic -Urine, Clean Catch         Magnesium         POC CBG, ED      CT HEAD   NON acute    CXR - ***NON acute  CTabd/pelvis - ***nonacute  CTA chest - ***nonacute, no PE, * no evidence of infiltrate  Following Medications were ordered in ER: Medications  QUEtiapine (SEROQUEL) tablet 50 mg (has no administration in time range)    _______________________________________________________ ER Provider Called:     DrMarland Kitchen  They Recommend admit to medicine *** Will see in AM  ***SEEN in ER   ED Triage Vitals  Enc Vitals Group     BP 01/07/23 1628 103/74     Pulse Rate 01/07/23 1628 95     Resp 01/07/23 1628 16     Temp 01/07/23 1628 98.9 F (37.2 C)     Temp Source 01/07/23 1628 Oral     SpO2 01/07/23 1628 98 %      Weight 01/07/23 1629 152 lb (68.9 kg)     Height 01/07/23 1629 5\' 6"  (1.676 m)     Head Circumference --      Peak Flow --      Pain Score 01/07/23 1629 0     Pain Loc --      Pain Edu? --      Excl. in GC? --   TMAX(24)@     _________________________________________ Significant initial  Findings: Abnormal Labs Reviewed  CBC WITH DIFFERENTIAL/PLATELET - Abnormal; Notable for the following components:      Result Value   RBC 3.79 (*)    Hemoglobin 11.0 (*)    HCT 34.2 (*)    Monocytes Absolute 1.1 (*)    Abs Immature Granulocytes 0.18 (*)    All other components within normal limits  COMPREHENSIVE METABOLIC PANEL - Abnormal; Notable for the following components:   Glucose, Bld 286 (*)    BUN 26 (*)    Creatinine, Ser 2.10 (*)    Calcium 8.6 (*)    Total Protein 5.2 (*)    Albumin 2.4 (*)    Alkaline Phosphatase 128 (*)    GFR, Estimated 32 (*)    All other components within normal limits  URINALYSIS, ROUTINE W REFLEX MICROSCOPIC - Abnormal; Notable for the following components:   Color, Urine AMBER (*)    APPearance HAZY (*)    Hgb urine dipstick LARGE (*)    Protein, ur 100 (*)    Leukocytes,Ua SMALL (*)    Bacteria, UA RARE (*)    All other components within normal limits  CBG MONITORING, ED - Abnormal; Notable for the following components:   Glucose-Capillary 257 (*)    All other components within normal limits    _________________________ Troponin   Cardiac Panel (last 3 results) Recent Labs    01/07/23 1638 01/07/23 2002  TROPONINIHS 14 14     ECG: Ordered Personally reviewed and interpreted by me showing: HR : *** Rhythm: *NSR, Sinus tachycardia * A.fib. W RVR, RBBB, LBBB, Paced Ischemic changes*nonspecific changes, no evidence of ischemic changes QTC*  BNP (last 3 results) Recent Labs    09/04/22 1500 12/29/22 0203  BNP 188.2* 374.0*     COVID-19 Labs  No results for input(s): "DDIMER", "FERRITIN", "LDH", "CRP" in the last 72 hours.  Lab  Results  Component Value Date   SARSCOV2NAA NEGATIVE 09/17/2022   SARSCOV2NAA NEGATIVE 09/04/2022   SARSCOV2NAA NEGATIVE 05/10/2021   SARSCOV2NAA NEGATIVE 06/25/2020    ____________________ This patient meets SIRS Criteria and may be septic.    The recent clinical data is shown below. Vitals:   01/07/23 2230 01/07/23 2245 01/07/23 2300 01/07/23 2315  BP: 123/82 139/78 (!) 126/93 (!) 136/57  Pulse: 92 98 98 98  Resp: (!) 24 (!) 30 (!) 24 (!) 23  Temp:      TempSrc:      SpO2: 98% 97% 97% 98%  Weight:      Height:        WBC     Component Value Date/Time   WBC 10.0 01/07/2023 1638   LYMPHSABS 1.6 01/07/2023 1638   MONOABS 1.1 (H) 01/07/2023 1638   EOSABS 0.3 01/07/2023 1638   BASOSABS 0.1 01/07/2023 1638    Procalcitonin *** Ordered      UA   no evidence of UTI    Urine analysis:    Component Value Date/Time   COLORURINE AMBER (A) 01/07/2023 2203   APPEARANCEUR HAZY (A) 01/07/2023 2203   LABSPEC 1.018 01/07/2023 2203   PHURINE 5.0 01/07/2023 2203   GLUCOSEU NEGATIVE 01/07/2023 2203   HGBUR LARGE (A) 01/07/2023 2203   BILIRUBINUR NEGATIVE 01/07/2023 2203   KETONESUR NEGATIVE 01/07/2023 2203   PROTEINUR 100 (A) 01/07/2023 2203   UROBILINOGEN 0.2 10/02/2009 1604   NITRITE NEGATIVE 01/07/2023 2203   LEUKOCYTESUR SMALL (A) 01/07/2023 2203    Results for orders placed or performed during the hospital encounter of 12/24/22  Surgical PCR screen     Status: None   Collection Time: 12/26/22 11:02 AM   Specimen: Nasal Mucosa; Nasal Swab  Result Value Ref Range Status   MRSA, PCR NEGATIVE NEGATIVE Final   Staphylococcus aureus NEGATIVE NEGATIVE Final    Comment: (NOTE) The Xpert SA Assay (FDA approved for NASAL specimens in patients 58 years of age and older), is one component of a comprehensive surveillance program. It is not intended to diagnose infection nor to guide or monitor treatment. Performed at [facility], [address] [place],  [name] [address]     ABX started Antibiotics Given (last 72 hours)     None       No results found for the last 90 days.     ________________________________________________________________  Arterial ***Venous  Blood Gas result:  pH *** pCO2 ***; pO2 ***;     %O2 Sat ***.  ABG    Component Value Date/Time   PHART 7.434 10/03/2015 0746   PCO2ART 36.4 10/03/2015 0746   PO2ART 72.0 (L) 10/03/2015 0746   HCO3 24.8 06/06/2021 0956   HCO3 26.1 06/06/2021 0956   TCO2 28 10/12/2022 0143   ACIDBASEDEF 1.0 06/06/2021 0956   O2SAT 71.0 06/06/2021 0956   O2SAT 67.0 06/06/2021 0956       __________________________________________________________ Recent Labs  Lab 01/01/23 0316 01/04/23 0625 01/07/23 1638  NA 136 138 139  K 3.4* 4.4 5.0  CO2 20* 25 26  GLUCOSE 135* 177* 286*  BUN 33* 28* 26*  CREATININE 1.91* 2.07* 2.10*  CALCIUM 8.0* 8.3* 8.6*  MG  --   --  2.0    Cr  * stable,  Up from baseline see below Lab Results  Component Value Date   CREATININE 2.10 (H) 01/07/2023   CREATININE 2.07 (H) 01/04/2023   CREATININE 1.91 (H) 01/01/2023    Recent  Labs  Lab 01/04/23 0625 01/07/23 1638  AST 31 39  ALT 33 31  ALKPHOS 107 128*  BILITOT 0.9 0.9  PROT 5.4* 5.2*  ALBUMIN 2.4* 2.4*   Lab Results  Component Value Date   CALCIUM 8.6 (L) 01/07/2023          Plt: Lab Results  Component Value Date   PLT 399 01/07/2023         Recent Labs  Lab 01/01/23 0316 01/04/23 0625 01/07/23 1638  WBC 10.7* 12.5* 10.0  NEUTROABS 6.7 8.1* 6.8  HGB 11.7* 11.2* 11.0*  HCT 35.7* 35.9* 34.2*  MCV 87.9 92.1 90.2  PLT 226 318 399    HG/HCT * stable,  Down *Up from baseline see below    Component Value Date/Time   HGB 11.0 (L) 01/07/2023 1638   HGB 15.8 05/31/2021 1438   HCT 34.2 (L) 01/07/2023 1638   HCT 45.2 05/31/2021 1438   MCV 90.2 01/07/2023 1638   MCV 87 05/31/2021 1438      No results for input(s): "LIPASE", "AMYLASE" in the last 168 hours. No results  for input(s): "AMMONIA" in the last 168 hours.    .lab  _______________________________________________ Hospitalist was called for admission for *** Transient alteration of awareness ***    The following Work up has been ordered so far:  Orders Placed This Encounter  Procedures   Urine Culture   CT Head Wo Contrast   CBC with Differential   Comprehensive metabolic panel   Ethanol   Rapid urine drug screen (hospital performed)   Urinalysis, Routine w reflex microscopic -Urine, Clean Catch   Magnesium   In and Out Cath   Consult to hospitalist   POC CBG, ED   ED EKG     OTHER Significant initial  Findings:  labs showing:     DM  labs:  HbA1C: Recent Labs    08/13/22 1729 09/18/22 0023  HGBA1C 14.6* 10.7*       CBG (last 3)  Recent Labs    01/07/23 1649  GLUCAP 257*          Cultures:    Component Value Date/Time   SDES STOOL 03/02/2008 1212   SDES STOOL 03/02/2008 1212   SDES STOOL 03/02/2008 1212   SPECREQUEST IMMUNE:NORM 03/02/2008 1212   SPECREQUEST NONE 03/02/2008 1212   SPECREQUEST IMMUNE:NORM 03/02/2008 1212   CULT  03/02/2008 1212    NO SALMONELLA, SHIGELLA, CAMPYLOBACTER, OR YERSINIA ISOLATED   REPTSTATUS 03/06/2008 FINAL 03/02/2008 1212   REPTSTATUS 03/03/2008 FINAL 03/02/2008 1212   REPTSTATUS 03/04/2008 FINAL 03/02/2008 1212     Radiological Exams on Admission: CT Head Wo Contrast  Result Date: 01/07/2023 CLINICAL DATA:  Increased aphasia EXAM: CT HEAD WITHOUT CONTRAST TECHNIQUE: Contiguous axial images were obtained from the base of the skull through the vertex without intravenous contrast. RADIATION DOSE REDUCTION: This exam was performed according to the departmental dose-optimization program which includes automated exposure control, adjustment of the mA and/or kV according to patient size and/or use of iterative reconstruction technique. COMPARISON:  01/04/2023 FINDINGS: Brain: No evidence of acute infarction, hemorrhage, mass, mass  effect, or midline shift. No hydrocephalus or extra-axial fluid collection. Mildly advanced cerebral atrophy for age. Periventricular white matter changes, likely the sequela of chronic small vessel ischemic disease. Vascular: No hyperdense vessel. Atherosclerotic calcifications in the intracranial carotid and vertebral arteries. Skull: Negative for fracture or focal lesion. Sinuses/Orbits: Mucous retention cysts in the maxillary sinuses. Status post bilateral lens replacements. Other: The mastoid air  cells are well aerated. IMPRESSION: No acute intracranial process. Electronically Signed   By: Wiliam Ke M.D.   On: 01/07/2023 18:48   _______________________________________________________________________________________________________ Latest  Blood pressure (!) 136/57, pulse 98, temperature 98.9 F (37.2 C), temperature source Oral, resp. rate (!) 23, height 5\' 6"  (1.676 m), weight 68.9 kg, SpO2 98 %.   Vitals  labs and radiology finding personally reviewed  Review of Systems:    Pertinent positives include: ***  Constitutional:  No weight loss, night sweats, Fevers, chills, fatigue, weight loss  HEENT:  No headaches, Difficulty swallowing,Tooth/dental problems,Sore throat,  No sneezing, itching, ear ache, nasal congestion, post nasal drip,  Cardio-vascular:  No chest pain, Orthopnea, PND, anasarca, dizziness, palpitations.no Bilateral lower extremity swelling  GI:  No heartburn, indigestion, abdominal pain, nausea, vomiting, diarrhea, change in bowel habits, loss of appetite, melena, blood in stool, hematemesis Resp:  no shortness of breath at rest. No dyspnea on exertion, No excess mucus, no productive cough, No non-productive cough, No coughing up of blood.No change in color of mucus.No wheezing. Skin:  no rash or lesions. No jaundice GU:  no dysuria, change in color of urine, no urgency or frequency. No straining to urinate.  No flank pain.  Musculoskeletal:  No joint pain or no  joint swelling. No decreased range of motion. No back pain.  Psych:  No change in mood or affect. No depression or anxiety. No memory loss.  Neuro: no localizing neurological complaints, no tingling, no weakness, no double vision, no gait abnormality, no slurred speech, no confusion  All systems reviewed and apart from HOPI all are negative _______________________________________________________________________________________________ Past Medical History:   Past Medical History:  Diagnosis Date   Acute renal failure (ARF) 09/24/2015   Acute stress disorder 11/13/2021   Atrial premature complexes    CAD (coronary artery disease) of artery bypass graft    Early occlusion of saphenous vein graft to intermediate and marginal branch in February 2007 following bypass grafting    CAD (coronary artery disease), native coronary artery 2017   hx NSTEMI 09-24-2015  s/p  CABG x5 on 10-02-2015;  post op STEMI inferolateral wall,  SVG OM1 and SVG OM2 occluded, distal OM occlusion the calpruit, treated medically // Myoview 7/21: no ischemia, EF 65, low risk   CHF (congestive heart failure)    CKD (chronic kidney disease), stage III    Contusion of right knee 03/16/2020   Dyspnea    Elevated troponin    Erectile dysfunction    Esophageal reflux    History of atrial fibrillation    post op CABG 10-02-2015   History of kidney stones    History of non-ST elevation myocardial infarction (NSTEMI) 09/24/2015   s/p  CABG x5   History of ST elevation myocardial infarction (STEMI) 10/22/2015   inferior wall,  post op CABG 10-02-2015   Hyperlipidemia    Hypertension    Left ureteral stone    Mild atherosclerosis of both carotid arteries    Nephrolithiasis    per CT bilateral non-obstructive calculi   OSA (obstructive sleep apnea)    Peripheral artery disease    LE Arterial US 01/2019: R PTA and ATA occluded; L ATA occluded   RBBB (right bundle branch block)    Renal atrophy, right    Sleep apnea     wears cpap    ST elevation myocardial infarction (STEMI) of inferior wall 10/22/2015   Type 2 diabetes mellitus treated with insulin    followed by pcp  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 03/01/2008   Wears glasses       Past Surgical History:  Procedure Laterality Date   APPENDECTOMY  1965   CARDIAC CATHETERIZATION N/A 09/26/2015   Procedure: Left Heart Cath and Coronary Angiography;  Surgeon: Lennette Bihari, MD;  Location: [facility] INVASIVE CV LAB;  Service: Cardiovascular;  Laterality: N/A;   CARDIAC CATHETERIZATION N/A 10/22/2015   Procedure: Left Heart Cath and Coronary Angiography;  Surgeon: Tonny Bollman, MD;  Location: [facility] INVASIVE CV LAB;  Service: Cardiovascular;  Laterality: N/A;   CATARACT EXTRACTION W/ INTRAOCULAR LENS  IMPLANT, BILATERAL  2017   COLONOSCOPY     CORONARY ARTERY BYPASS GRAFT N/A 10/02/2015   Procedure: CORONARY ARTERY BYPASS GRAFTING (CABG) X5 LIMA-LAD; SVG-DIAG; SVG-OM; SVG-PD; SVG-RAMUS TRANSESOPHAGEAL ECHOCARDIOGRAM (TEE) ENDOSCOPIC GREATER SAPHENOUS VEIN  HARVEST BILAT LE;  Surgeon: Kerin Perna, MD;  Location: [facility] OR;  Service: Open Heart Surgery;  Laterality: N/A;   CYSTOSCOPY/URETEROSCOPY/HOLMIUM LASER/STENT PLACEMENT Left 08/10/2018   Procedure: CYSTOSCOPY/URETEROSCOPY/HOLMIUM LASER/STENT PLACEMENT;  Surgeon: Jerilee Field, MD;  Location: [facility];  Service: Urology;  Laterality: Left;   CYSTOSCOPY/URETEROSCOPY/HOLMIUM LASER/STENT PLACEMENT Left 09/10/2018   Procedure: CYSTOSCOPY/URETEROSCOPY/HOLMIUM LASER/STENT EXCHANGE;  Surgeon: Jerilee Field, MD;  Location: [facility]S;  Service: Urology;  Laterality: Left;   HIP ARTHROPLASTY Right 12/26/2022   Procedure: RIGHT HEMI HIP ARTHROPLASTY;  Surgeon: Joen Laura, MD;  Location: [facility] OR;  Service: Orthopedics;  Laterality: Right;   LEFT HEART CATHETERIZATION WITH CORONARY ANGIOGRAM N/A 04/13/2014   Procedure: LEFT  HEART CATHETERIZATION WITH CORONARY ANGIOGRAM;  Surgeon: Othella Boyer, MD;  Location: [facility] CATH LAB;  Service: Cardiovascular;  Laterality: N/A;   LEG SURGERY Right age 69   closed reduction leg fracture   NASAL SEPTOPLASTY W/ TURBINOPLASTY Bilateral 08/30/2021   Procedure: NASAL SEPTOPLASTY WITH BILATERAL INFERIOR TURBINATE REDUCTION;  Surgeon: Osborn Coho, MD;  Location: [facility] OR;  Service: ENT;  Laterality: Bilateral;   POLYPECTOMY     RIGHT HEART CATH N/A 06/06/2021   Procedure: RIGHT HEART CATH;  Surgeon: Dolores Patty, MD;  Location: [facility] INVASIVE CV LAB;  Service: Cardiovascular;  Laterality: N/A;   TEE WITHOUT CARDIOVERSION N/A 10/02/2015   Procedure: TRANSESOPHAGEAL ECHOCARDIOGRAM (TEE);  Surgeon: Kerin Perna, MD;  Location: [facility] OR;  Service: Open Heart Surgery;  Laterality: N/A;   URETEROSCOPY WITH HOLMIUM LASER LITHOTRIPSY Bilateral 2004;  2005  dr Isabel Caprice  @[email or web address]    VASECTOMY      Social History:  Ambulatory *** independently cane, walker  wheelchair bound, bed bound     reports that he has never smoked. He has never used smokeless tobacco. He reports that he does not currently use alcohol. He reports that he does not use drugs.     Family History: *** Family History  Problem Relation Age of Onset   Heart attack Mother    Heart attack Father    Diabetes Brother    Pancreatic cancer Brother    Diabetes Brother    Colon cancer Neg Hx    Esophageal cancer Neg Hx    Prostate cancer Neg Hx    Rectal cancer Neg Hx    Stomach cancer Neg Hx    Colon polyps Neg Hx    ______________________________________________________________________________________________ Allergies: Allergies  Allergen Reactions   Cilostazol Swelling and Other (See Comments)    Edema    Dulaglutide Nausea And Vomiting and Other (See Comments)    TRULICITY  Levofloxacin Hives, Itching and Rash   Liraglutide Other (See Comments)    Severe fatigue & insomnia   Lisinopril Itching, Rash and  Cough     Prior to Admission medications   Medication Sig Start Date End Date Taking? Authorizing Provider  acetaminophen (TYLENOL) 500 MG tablet Take 2 tablets (1,000 mg total) by mouth every 8 (eight) hours as needed. 12/26/22 01/25/23  Cecil Cobbs, [name]  apixaban (ELIQUIS) 5 MG TABS tablet Take 1 tablet (5 mg total) by mouth 2 (two) times daily. 01/01/23   Lorin Glass, MD  atorvastatin (LIPITOR) 80 MG tablet TAKE ONE TABLET BY MOUTH EVERY EVENING 11/13/22   Weaver, Scott T, [name]  B-D UF III MINI PEN NEEDLES 31G X 5 MM MISC USE AS DIRECTED WITH LANTUS SOLOSTAR 04/02/19   [provider]  budesonide (PULMICORT) 0.25 MG/2ML nebulizer solution Take 2 mLs (0.25 mg total) by nebulization 2 (two) times daily. 01/01/23   Lorin Glass, MD  buPROPion (WELLBUTRIN XL) 150 MG 24 hr tablet Take 150 mg by mouth every morning. 12/10/22   [provider]  Continuous Blood Gluc Sensor (FREESTYLE LIBRE 2 SENSOR) MISC Inject 1 Device into the skin every 14 (fourteen) days.    [provider]  DULoxetine (CYMBALTA) 30 MG capsule Take 3 capsules (90 mg total) by mouth every morning. 01/02/23   Lorin Glass, MD  feeding supplement, GLUCERNA SHAKE, (GLUCERNA SHAKE) LIQD Take 237 mLs by mouth 3 (three) times daily between meals. 01/01/23   Dahal, Melina Schools, MD  insulin aspart (NOVOLOG) 100 UNIT/ML injection Inject 0-15 Units into the skin 3 (three) times daily with meals. 01/01/23   Dahal, Melina Schools, MD  insulin aspart (NOVOLOG) 100 UNIT/ML injection Inject 0-5 Units into the skin at bedtime. 01/01/23   Lorin Glass, MD  Insulin Glargine (BASAGLAR KWIKPEN) 100 UNIT/ML SOPN Inject 40 Units into the skin in the morning.    [provider]  MAGNESIUM PO Take 350 mg by mouth every evening.    [provider]  Multiple Vitamins-Minerals (SENIOR MULTIVITAMIN PLUS PO) Take 1 tablet by mouth daily with breakfast.    [provider]  nitroGLYCERIN (NITROSTAT) 0.4 MG SL tablet  Place 0.4 mg under the tongue every 5 (five) minutes as needed for chest pain.    [provider]  nystatin (MYCOSTATIN/NYSTOP) powder Apply 1 Application topically 2 (two) times daily as needed (skin irritation). Apply to the groin in the morning and evening    [provider]  nystatin cream (MYCOSTATIN) Apply 1 Application topically 2 (two) times daily as needed for dry skin. Apply to the groin in the morning and evening when not using the nystatin powder    [provider]  oxyCODONE (ROXICODONE) 5 MG immediate release tablet Take 1 tablet (5 mg total) by mouth every 4 (four) hours as needed for up to 7 days for severe pain or moderate pain. 01/01/23 01/08/23  Freeman Caldron, [name]  pantoprazole (PROTONIX) 40 MG tablet TAKE 1 TABLET BY MOUTH EVERY DAY Patient taking differently: Take 40 mg by mouth in the morning. 11/25/19   [name], MD  polyethylene glycol (MIRALAX / GLYCOLAX) 17 g packet Take 17 g by mouth daily as needed for mild constipation. Patient not taking: Reported on 12/24/2022 09/22/22   Osvaldo Shipper, MD  QUEtiapine (SEROQUEL) 50 MG tablet Take 1 tablet (50 mg total) by mouth 2 (two) times daily. Patient taking differently: Take 50 mg by mouth daily. 09/23/22   Osvaldo Shipper,  MD  senna-docusate (SENOKOT-S) 8.6-50 MG tablet Take 1 tablet by mouth at bedtime as needed for mild constipation. 01/01/23   Lorin Glass, MD  tamsulosin (FLOMAX) 0.4 MG CAPS capsule Take 1 capsule (0.4 mg total) by mouth daily after breakfast. 01/02/23   Lorin Glass, MD    ___________________________________________________________________________________________________ Physical Exam:    01/07/2023   11:15 PM 01/07/2023   11:00 PM 01/07/2023   10:45 PM  Vitals with BMI  Systolic 136 126 865  Diastolic 57 93 78  Pulse 98 98 98     1. General:  in No ***Acute distress***increased work of breathing ***complaining of severe pain****agitated * Chronically ill *well  *cachectic *toxic acutely ill -appearing 2. Psychological: Alert and *** Oriented 3. Head/ENT:   Moist *** Dry Mucous Membranes                          Head Non traumatic, neck supple                          Normal *** Poor Dentition 4. SKIN: normal *** decreased Skin turgor,  Skin clean Dry and intact no rash    5. Heart: Regular rate and rhythm no*** Murmur, no Rub or gallop 6. Lungs: ***Clear to auscultation bilaterally, no wheezes or crackles   7. Abdomen: Soft, ***non-tender, Non distended *** obese ***bowel sounds present 8. Lower extremities: no clubbing, cyanosis, no ***edema 9. Neurologically Grossly intact, moving all 4 extremities equally *** strength 5 out of 5 in all 4 extremities cranial nerves II through XII intact 10. MSK: Normal range of motion    Chart has been reviewed  ______________________________________________________________________________________________  Assessment/Plan  ***  Admitted for *** Transient alteration of awareness ***    Present on Admission: **None**     No problem-specific Assessment & Plan notes found for this encounter.    Other plan as per orders.  DVT prophylaxis:  SCD *** Lovenox       Code Status:    Code Status: Prior FULL CODE *** DNR/DNI ***comfort care as per patient ***family  I had personally discussed CODE STATUS with patient and family* I had spent *min discussing goals of care and CODE STATUS ACP has been reviewed ***   Family Communication:   Family not at  Bedside  plan of care was discussed on the phone with *** Son, Daughter, Wife, Husband, Sister, Brother , father, mother  Diet    Disposition Plan:   *** likely will need placement for rehabilitation                          Back to current facility when stable                            To home once workup is complete and patient is stable  ***Following barriers for discharge:                            Electrolytes corrected                                Anemia corrected  Pain controlled with PO medications                               Afebrile, white count improving able to transition to PO antibiotics                             Will need to be able to tolerate PO                            Will likely need home health, home O2, set up                           Will need consultants to evaluate patient prior to discharge  ****EXPECT DC tomorrow       Consult Orders  (From admission, onward)           Start     Ordered   01/07/23 2311  Consult to hospitalist  Once       Provider:  (Not yet assigned)  Question Answer Comment  Place call to: [facility]   Reason for Consult Admit      01/07/23 2310                              ***Would benefit from PT/OT eval prior to DC  Ordered                   Swallow eval - SLP ordered                   Diabetes care coordinator                   Transition of care consulted                   Nutrition    consulted                  Wound care  consulted                   Palliative care    consulted                   Behavioral health  consulted                    Consults called: ***     Admission status:  ED Disposition     ED Disposition  Admit   Condition  --   Comment  The patient appears reasonably stabilized for admission considering the current resources, flow, and capabilities available in the ED at this time, and I doubt any other [facility] requiring further screening and/or treatment in the ED prior to admission is  present.           Obs***  ***  inpatient     I Expect 2 midnight stay secondary to severity of patient's current illness need for inpatient interventions justified by the following: ***hemodynamic instability despite optimal treatment (tachycardia *hypotension * tachypnea *hypoxia, hypercapnia) * Severe lab/radiological/exam abnormalities including:     and extensive comorbidities  including: *substance abuse  *Chronic pain *DM2  * CHF * CAD  * COPD/asthma *Morbid Obesity * CKD *dementia *liver disease *history of stroke  with residual deficits *  malignancy, * sickle cell disease  History of amputation Chronic anticoagulation  That are currently affecting medical management.   I expect  patient to be hospitalized for 2 midnights requiring inpatient medical care.  Patient is at high risk for adverse outcome (such as loss of life or disability) if not treated.  Indication for inpatient stay as follows:  Severe change from baseline regarding mental status Hemodynamic instability despite maximal medical therapy,  ongoing suicidal ideations,  severe pain requiring acute inpatient management,  inability to maintain oral hydration   persistent chest pain despite medical management Need for operative/procedural  intervention New or worsening hypoxia   Need for IV antibiotics, IV fluids, IV rate controling medications, IV antihypertensives, IV pain medications, IV anticoagulation, need for biPAP    Level of care   *** tele  For 12H 24H     medical floor       progressive tele indefinitely please discontinue once patient no longer qualifies COVID-19 Labs    Lab Results  Component Value Date   SARSCOV2NAA NEGATIVE 09/17/2022     Precautions: admitted as *** Covid Negative  ***asymptomatic screening protocol****PUI *** covid positive No active isolations ***If Covid PCR is negative  - please DC precautions - would need additional investigation given very high risk for false native test result    Critical***  Patient is critically ill due to  hemodynamic instability * respiratory failure *severe sepsis* ongoing chest pain*  They are at high risk for life/limb threatening clinical deterioration requiring frequent reassessment and modifications of care.  Services provided include examination of the patient, review of relevant ancillary tests,  prescription of lifesaving therapies, review of medications and prophylactic therapy.  Total critical care time excluding separately billable procedures: 60*  Minutes.    Mikiyah Glasner 01/07/2023, 11:32 PM ***  [facility]     after 2 AM please page floor coverage PA If 7AM-7PM, please contact the day team taking care of the patient using [email or web address]

## 2023-01-07 NOTE — ED Provider Notes (Signed)
[place] [facility]MENT AT [facility] Provider Note   CSN: [id number] Arrival date & time: 01/07/23  1624     History  Chief Complaint  Patient presents with   Altered Mental Status    Fernando Hess is a 77 y.o. male with cognitive impairment, HTN, HLD, CAD s/p CABG x5, T2DM, OSA on CPAP, paroxysmal Afib on eliquis, CKD stage 3, myasthenia gravis, HFpEF, GERD, hyperparathyroidism, MDD, h/o amaurosis fugax, BPH who  presents with AMS.   History provided largely by wife. Per chart review, patient was admitted from 12/24/2022 to 01/01/2023 after a fall with hip fracture. Had hip replacement and was DC'd to rehab facility. Also had urinary retention/UTI, completed 7 days of rocephin, and was started on flomax for BPH.  Patient is resident at [facility]. Patients wife provides history. Patient called her from his nursing center and was unable to really speak on the phone, was altered at that time. She thought his speech was more slurred or aphasic from normal. He has already been dealing with abnormalities of his speech wife states since the fall on 12/24/22, his speech hasn't been normal since then. Patient complains of no other numbness/tingling, asymmetric weakness. When asked why he is here, patient states it is because of the fall, doesn't know why else it would be. He doesn't have any memory of what happened earlier today. He complains of no pain at this time, denies CP, SOB, abd pain. Endorses some pain where he had his hip replaced but has had no additional falls/trauma.    Altered Mental Status      Home Medications Prior to Admission medications   Medication Sig Start Date End Date Taking? Authorizing Provider  acetaminophen (TYLENOL) 500 MG tablet Take 2 tablets (1,000 mg total) by mouth every 8 (eight) hours as needed. 12/26/22 01/25/23  Cecil Cobbs, [name]  apixaban (ELIQUIS) 5 MG TABS tablet Take 1 tablet (5 mg total) by mouth 2  (two) times daily. 01/01/23   Lorin Glass, MD  atorvastatin (LIPITOR) 80 MG tablet TAKE ONE TABLET BY MOUTH EVERY EVENING 11/13/22   Weaver, Scott T, [name]  B-D UF III MINI PEN NEEDLES 31G X 5 MM MISC USE AS DIRECTED WITH LANTUS SOLOSTAR 04/02/19   [provider]  budesonide (PULMICORT) 0.25 MG/2ML nebulizer solution Take 2 mLs (0.25 mg total) by nebulization 2 (two) times daily. 01/01/23   Lorin Glass, MD  buPROPion (WELLBUTRIN XL) 150 MG 24 hr tablet Take 150 mg by mouth every morning. 12/10/22   [provider]  Continuous Blood Gluc Sensor (FREESTYLE LIBRE 2 SENSOR) MISC Inject 1 Device into the skin every 14 (fourteen) days.    [provider]  DULoxetine (CYMBALTA) 30 MG capsule Take 3 capsules (90 mg total) by mouth every morning. 01/02/23   Lorin Glass, MD  feeding supplement, GLUCERNA SHAKE, (GLUCERNA SHAKE) LIQD Take 237 mLs by mouth 3 (three) times daily between meals. 01/01/23   Dahal, Melina Schools, MD  insulin aspart (NOVOLOG) 100 UNIT/ML injection Inject 0-15 Units into the skin 3 (three) times daily with meals. 01/01/23   Dahal, Melina Schools, MD  insulin aspart (NOVOLOG) 100 UNIT/ML injection Inject 0-5 Units into the skin at bedtime. 01/01/23   Lorin Glass, MD  Insulin Glargine (BASAGLAR KWIKPEN) 100 UNIT/ML SOPN Inject 40 Units into the skin in the morning.    [provider]  MAGNESIUM PO Take 350 mg by mouth every evening.    [provider]  Multiple Vitamins-Minerals (SENIOR MULTIVITAMIN PLUS PO) Take 1 tablet by mouth daily with breakfast.    [provider]  nitroGLYCERIN (NITROSTAT) 0.4 MG SL tablet Place 0.4 mg under the tongue every 5 (five) minutes as needed for chest pain.    [provider]  nystatin (MYCOSTATIN/NYSTOP) powder Apply 1 Application topically 2 (two) times daily as needed (skin irritation). Apply to the groin in the morning and evening    [provider]  nystatin cream (MYCOSTATIN) Apply 1  Application topically 2 (two) times daily as needed for dry skin. Apply to the groin in the morning and evening when not using the nystatin powder    [provider]  oxyCODONE (ROXICODONE) 5 MG immediate release tablet Take 1 tablet (5 mg total) by mouth every 4 (four) hours as needed for up to 7 days for severe pain or moderate pain. 01/01/23 01/08/23  Freeman Caldron, [name]  pantoprazole (PROTONIX) 40 MG tablet TAKE 1 TABLET BY MOUTH EVERY DAY Patient taking differently: Take 40 mg by mouth in the morning. 11/25/19   [name], MD  polyethylene glycol (MIRALAX / GLYCOLAX) 17 g packet Take 17 g by mouth daily as needed for mild constipation. Patient not taking: Reported on 12/24/2022 09/22/22   Osvaldo Shipper, MD  QUEtiapine (SEROQUEL) 50 MG tablet Take 1 tablet (50 mg total) by mouth 2 (two) times daily. Patient taking differently: Take 50 mg by mouth daily. 09/23/22   Osvaldo Shipper, MD  senna-docusate (SENOKOT-S) 8.6-50 MG tablet Take 1 tablet by mouth at bedtime as needed for mild constipation. 01/01/23   Lorin Glass, MD  tamsulosin (FLOMAX) 0.4 MG CAPS capsule Take 1 capsule (0.4 mg total) by mouth daily after breakfast. 01/02/23   Dahal, Melina Schools, MD      Allergies    Cilostazol, Dulaglutide, Levofloxacin, Liraglutide, and Lisinopril    Review of Systems   Review of Systems Review of systems Negative for f/c.  A 10 point review of systems was performed and is negative unless otherwise reported in HPI.  Physical Exam Updated Vital Signs BP (!) 136/57   Pulse 98   Temp 98.9 F (37.2 C) (Oral)   Resp (!) 23   Ht 5\' 6"  (1.676 m)   Wt 68.9 kg   SpO2 98%   BMI 24.53 kg/m  Physical Exam General: Chronically ill- appearing male, lying in bed.  HEENT: PERRLA, EOMI, Sclera anicteric, dry mucous membranes, trachea midline. Tongue protrudes midline. Cardiology: RRR, no murmurs/rubs/gallops. BL radial and DP pulses equal bilaterally.  Resp: Normal respiratory rate and effort.  CTAB, no wheezes, rhonchi, crackles.  Abd: Soft, non-tender, non-distended. No rebound tenderness or guarding.  GU: Deferred. MSK: No peripheral edema or signs of trauma. Extremities without deformity or TTP. No cyanosis or clubbing. Skin: warm, dry. No rashes or lesions. Back: No CVA tenderness Neuro: A&Ox3 (not to situation), CNs II-XII grossly intact. 4/5 strength in all four extremities. Sensation grossly intact. Mild tremor present.  Psych: Normal mood and affect.   ED Results / Procedures / Treatments   Labs (all labs ordered are listed, but only abnormal results are displayed) Labs Reviewed  CBC WITH DIFFERENTIAL/PLATELET - Abnormal; Notable for the following components:      Result Value   RBC 3.79 (*)    Hemoglobin 11.0 (*)    HCT 34.2 (*)    Monocytes Absolute 1.1 (*)    Abs Immature Granulocytes 0.18 (*)    All other components within normal limits  COMPREHENSIVE  METABOLIC PANEL - Abnormal; Notable for the following components:   Glucose, Bld 286 (*)    BUN 26 (*)    Creatinine, Ser 2.10 (*)    Calcium 8.6 (*)    Total Protein 5.2 (*)    Albumin 2.4 (*)    Alkaline Phosphatase 128 (*)    GFR, Estimated 32 (*)    All other components within normal limits  URINALYSIS, ROUTINE W REFLEX MICROSCOPIC - Abnormal; Notable for the following components:   Color, Urine AMBER (*)    APPearance HAZY (*)    Hgb urine dipstick LARGE (*)    Protein, ur 100 (*)    Leukocytes,Ua SMALL (*)    Bacteria, UA RARE (*)    All other components within normal limits  CBG MONITORING, ED - Abnormal; Notable for the following components:   Glucose-Capillary 257 (*)    All other components within normal limits  URINE CULTURE  SARS CORONAVIRUS 2 BY RT PCR  ETHANOL  RAPID URINE DRUG SCREEN, HOSP PERFORMED  MAGNESIUM  TROPONIN I (HIGH SENSITIVITY)  TROPONIN I (HIGH SENSITIVITY)    EKG EKG Interpretation  Date/Time:  Thursday January 08 2023 12:04:31 EDT Ventricular Rate:  104 PR  Interval:  157 QRS Duration: 130 QT Interval:  364 QTC Calculation: 479 R Axis:   110 Text Interpretation: Sinus tachycardia Multiple premature complexes, vent & supraven Nonspecific intraventricular conduction delay Borderline repolarization abnormality when comapred to prior, overall similar appearance. No STEMI Confirmed by Theda Belfast ([id number]) on 01/09/2023 7:10:26 AM  Radiology CT Head Wo Contrast  Result Date: 01/07/2023 CLINICAL DATA:  Increased aphasia EXAM: CT HEAD WITHOUT CONTRAST TECHNIQUE: Contiguous axial images were obtained from the base of the skull through the vertex without intravenous contrast. RADIATION DOSE REDUCTION: This exam was performed according to the departmental dose-optimization program which includes automated exposure control, adjustment of the mA and/or kV according to patient size and/or use of iterative reconstruction technique. COMPARISON:  01/04/2023 FINDINGS: Brain: No evidence of acute infarction, hemorrhage, mass, mass effect, or midline shift. No hydrocephalus or extra-axial fluid collection. Mildly advanced cerebral atrophy for age. Periventricular white matter changes, likely the sequela of chronic small vessel ischemic disease. Vascular: No hyperdense vessel. Atherosclerotic calcifications in the intracranial carotid and vertebral arteries. Skull: Negative for fracture or focal lesion. Sinuses/Orbits: Mucous retention cysts in the maxillary sinuses. Status post bilateral lens replacements. Other: The mastoid air cells are well aerated. IMPRESSION: No acute intracranial process. Electronically Signed   By: Wiliam Ke M.D.   On: 01/07/2023 18:48    Procedures Procedures    Medications Ordered in ED Medications  QUEtiapine (SEROQUEL) tablet 50 mg (50 mg Oral Given 01/07/23 2333)    ED Course/ Medical Decision Making/ A&P                          Medical Decision Making Amount and/or Complexity of Data Reviewed Labs: ordered. Decision-making  details documented in ED Course. Radiology: ordered. Decision-making details documented in ED Course.  Risk Decision regarding hospitalization.    This patient presents to the ED for concern of AMS and possible aphasia/dysarthria, this involves an extensive number of treatment options, and is a complaint that carries with it a high risk of complications and morbidity.  I considered the following differential and admission for this acute, potentially life threatening condition.   MDM:    Ddx of acute altered mental status or encephalopathy considered but not limited to: -Intracranial  abnormalities such as ICH, hydrocephalus, or CVA. No head trauma reported. Wife does report speech difficulties but now he is able to talk, though she still states he isn't speaking at his baseline, though that was several weeks ago that he last had. She seems to be describing an episode of dysarthria/aphasia, concerning for TIA/CVA. He denies head trauma, will still obtain a CTH as patient is confused and can't exactly state why he is here today, doesn't seem to remember the episode from earlier. -Infection such as UTI, PNA - he is currently afebrile and hemodynamically stable, and does not demonstrate any localizing signs of infection and he has no increased pain in his hip or any redness of the area to indicate septic arthritis from his hip replacement.  No cough or shortness of breath indicate pneumonia, no urinary symptoms to indicate UTI but will evaluate -Toxic ingestion such as opioid overdose, anticholinergic toxicity.  He denies any alcohol or drug use. -Electrolyte abnormalities or hyper/hypoglycemia -Hypercarbia or hypoxia -Hepatic encephalopathy or uremia -ACS or arrhythmia -without any associated chest pain   Clinical Course as of 01/07/23 2336  Wed Jan 07, 2023  1658 Glucose-Capillary(!): 257 [HN]  1749 Creatinine(!): 2.10 BL ~2 [HN]  1749 WBC: 10.0 No leukocytosis [HN]  1749 Hemoglobin(!):  11.0 C/w priors [HN]  1749 Troponin I (High Sensitivity): 14 [HN]  1749 Magnesium: 2.0 [HN]  1859 CT Head Wo Contrast No acute intracranial process. [HN]    Clinical Course User Index [HN] Loetta Rough, MD    Labs: I Ordered, and personally interpreted labs.  The pertinent results include: Those listed above  Imaging Studies ordered: I ordered imaging studies including CT head I independently visualized and interpreted imaging. I agree with the radiologist interpretation  Additional history obtained from wife at bedside, chart review.   Reevaluation: After the interventions noted above, I reevaluated the patient and found that they have :stayed the same  Social Determinants of Health:  patient lives in a skilled nursing facility  Disposition: Patient's workup largely unrevealing for cause of his confusion.  I am afraid the patient may require a TIA workup or at least a brain MRI to rule out CVA, patient is still confused to situation on my reevaluation, will need admission for altered mental status.  Co morbidities that complicate the patient evaluation  Past Medical History:  Diagnosis Date   Acute renal failure (ARF) 09/24/2015   Acute stress disorder 11/13/2021   Atrial premature complexes    CAD (coronary artery disease) of artery bypass graft    Early occlusion of saphenous vein graft to intermediate and marginal branch in February 2007 following bypass grafting    CAD (coronary artery disease), native coronary artery 2017   hx NSTEMI 09-24-2015  s/p  CABG x5 on 10-02-2015;  post op STEMI inferolateral wall,  SVG OM1 and SVG OM2 occluded, distal OM occlusion the calpruit, treated medically // Myoview 7/21: no ischemia, EF 65, low risk   CHF (congestive heart failure)    CKD (chronic kidney disease), stage III    Contusion of right knee 03/16/2020   Dyspnea    Elevated troponin    Erectile dysfunction    Esophageal reflux    History of atrial fibrillation     post op CABG 10-02-2015   History of kidney stones    History of non-ST elevation myocardial infarction (NSTEMI) 09/24/2015   s/p  CABG x5   History of ST elevation myocardial infarction (STEMI) 10/22/2015   inferior wall,  post op CABG 10-02-2015   Hyperlipidemia    Hypertension    Left ureteral stone    Mild atherosclerosis of both carotid arteries    Nephrolithiasis    per CT bilateral non-obstructive calculi   OSA (obstructive sleep apnea)    Peripheral artery disease    LE Arterial US 01/2019: R PTA and ATA occluded; L ATA occluded   RBBB (right bundle branch block)    Renal atrophy, right    Sleep apnea    wears cpap    ST elevation myocardial infarction (STEMI) of inferior wall 10/22/2015   Type 2 diabetes mellitus treated with insulin    followed by pcp   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    Loetta Rough, MD 01/16/23 [phone]

## 2023-01-07 NOTE — ED Notes (Signed)
Pt given ED Snack Bag 

## 2023-01-07 NOTE — ED Triage Notes (Signed)
Per [facility] pt coming from [facility]. Patients wife was speaking on the phone with patient and states patient was having increased aphasia from baseline. States patient recently diagnosed with UTI.   Ems vitals 130/70 HR 80-110 irregular 97% 2L [place] baseline CBG 247

## 2023-01-08 ENCOUNTER — Observation Stay (HOSPITAL_COMMUNITY): Payer: Medicare Other

## 2023-01-08 DIAGNOSIS — E860 Dehydration: Secondary | ICD-10-CM | POA: Diagnosis present

## 2023-01-08 DIAGNOSIS — F0394 Unspecified dementia, unspecified severity, with anxiety: Secondary | ICD-10-CM | POA: Diagnosis present

## 2023-01-08 DIAGNOSIS — R4182 Altered mental status, unspecified: Secondary | ICD-10-CM | POA: Diagnosis present

## 2023-01-08 DIAGNOSIS — G928 Other toxic encephalopathy: Secondary | ICD-10-CM | POA: Diagnosis present

## 2023-01-08 DIAGNOSIS — M25572 Pain in left ankle and joints of left foot: Secondary | ICD-10-CM | POA: Diagnosis present

## 2023-01-08 DIAGNOSIS — E1169 Type 2 diabetes mellitus with other specified complication: Secondary | ICD-10-CM | POA: Diagnosis not present

## 2023-01-08 DIAGNOSIS — I130 Hypertensive heart and chronic kidney disease with heart failure and stage 1 through stage 4 chronic kidney disease, or unspecified chronic kidney disease: Secondary | ICD-10-CM | POA: Diagnosis present

## 2023-01-08 DIAGNOSIS — F03918 Unspecified dementia, unspecified severity, with other behavioral disturbance: Secondary | ICD-10-CM | POA: Diagnosis present

## 2023-01-08 DIAGNOSIS — E1151 Type 2 diabetes mellitus with diabetic peripheral angiopathy without gangrene: Secondary | ICD-10-CM | POA: Diagnosis present

## 2023-01-08 DIAGNOSIS — I6523 Occlusion and stenosis of bilateral carotid arteries: Secondary | ICD-10-CM | POA: Diagnosis present

## 2023-01-08 DIAGNOSIS — N179 Acute kidney failure, unspecified: Secondary | ICD-10-CM | POA: Diagnosis present

## 2023-01-08 DIAGNOSIS — E113392 Type 2 diabetes mellitus with moderate nonproliferative diabetic retinopathy without macular edema, left eye: Secondary | ICD-10-CM | POA: Diagnosis present

## 2023-01-08 DIAGNOSIS — R4701 Aphasia: Secondary | ICD-10-CM | POA: Diagnosis present

## 2023-01-08 DIAGNOSIS — I480 Paroxysmal atrial fibrillation: Secondary | ICD-10-CM | POA: Diagnosis present

## 2023-01-08 DIAGNOSIS — I77810 Thoracic aortic ectasia: Secondary | ICD-10-CM | POA: Diagnosis present

## 2023-01-08 DIAGNOSIS — N1832 Chronic kidney disease, stage 3b: Secondary | ICD-10-CM | POA: Diagnosis present

## 2023-01-08 DIAGNOSIS — E1165 Type 2 diabetes mellitus with hyperglycemia: Secondary | ICD-10-CM | POA: Diagnosis present

## 2023-01-08 DIAGNOSIS — I5032 Chronic diastolic (congestive) heart failure: Secondary | ICD-10-CM | POA: Diagnosis present

## 2023-01-08 DIAGNOSIS — G9340 Encephalopathy, unspecified: Secondary | ICD-10-CM | POA: Diagnosis not present

## 2023-01-08 DIAGNOSIS — Z1152 Encounter for screening for COVID-19: Secondary | ICD-10-CM | POA: Diagnosis not present

## 2023-01-08 DIAGNOSIS — E785 Hyperlipidemia, unspecified: Secondary | ICD-10-CM | POA: Diagnosis present

## 2023-01-08 DIAGNOSIS — F418 Other specified anxiety disorders: Secondary | ICD-10-CM | POA: Diagnosis not present

## 2023-01-08 DIAGNOSIS — E869 Volume depletion, unspecified: Secondary | ICD-10-CM | POA: Diagnosis present

## 2023-01-08 DIAGNOSIS — R319 Hematuria, unspecified: Secondary | ICD-10-CM | POA: Diagnosis present

## 2023-01-08 DIAGNOSIS — Z794 Long term (current) use of insulin: Secondary | ICD-10-CM | POA: Diagnosis not present

## 2023-01-08 DIAGNOSIS — R609 Edema, unspecified: Secondary | ICD-10-CM | POA: Diagnosis not present

## 2023-01-08 DIAGNOSIS — E1122 Type 2 diabetes mellitus with diabetic chronic kidney disease: Secondary | ICD-10-CM | POA: Diagnosis present

## 2023-01-08 DIAGNOSIS — F0393 Unspecified dementia, unspecified severity, with mood disturbance: Secondary | ICD-10-CM | POA: Diagnosis present

## 2023-01-08 DIAGNOSIS — I25810 Atherosclerosis of coronary artery bypass graft(s) without angina pectoris: Secondary | ICD-10-CM | POA: Diagnosis present

## 2023-01-08 DIAGNOSIS — Z79899 Other long term (current) drug therapy: Secondary | ICD-10-CM | POA: Diagnosis not present

## 2023-01-08 DIAGNOSIS — F32A Depression, unspecified: Secondary | ICD-10-CM | POA: Diagnosis present

## 2023-01-08 LAB — I-STAT VENOUS BLOOD GAS, ED
Acid-Base Excess: 3 mmol/L — ABNORMAL HIGH (ref 0.0–2.0)
Bicarbonate: 28.4 mmol/L — ABNORMAL HIGH (ref 20.0–28.0)
Calcium, Ion: 1.2 mmol/L (ref 1.15–1.40)
HCT: 32 % — ABNORMAL LOW (ref 39.0–52.0)
Hemoglobin: 10.9 g/dL — ABNORMAL LOW (ref 13.0–17.0)
O2 Saturation: 72 %
Potassium: 4.3 mmol/L (ref 3.5–5.1)
Sodium: 141 mmol/L (ref 135–145)
TCO2: 30 mmol/L (ref 22–32)
pCO2, Ven: 48.4 mmHg (ref 44–60)
pH, Ven: 7.376 (ref 7.25–7.43)
pO2, Ven: 40 mmHg (ref 32–45)

## 2023-01-08 LAB — PHOSPHORUS: Phosphorus: 3 mg/dL (ref 2.5–4.6)

## 2023-01-08 LAB — CBC WITH DIFFERENTIAL/PLATELET
Abs Immature Granulocytes: 0.16 10*3/uL — ABNORMAL HIGH (ref 0.00–0.07)
Basophils Absolute: 0.1 10*3/uL (ref 0.0–0.1)
Basophils Relative: 1 %
Eosinophils Absolute: 0.3 10*3/uL (ref 0.0–0.5)
Eosinophils Relative: 3 %
HCT: 32 % — ABNORMAL LOW (ref 39.0–52.0)
Hemoglobin: 10.5 g/dL — ABNORMAL LOW (ref 13.0–17.0)
Immature Granulocytes: 2 %
Lymphocytes Relative: 20 %
Lymphs Abs: 2 10*3/uL (ref 0.7–4.0)
MCH: 29.9 pg (ref 26.0–34.0)
MCHC: 32.8 g/dL (ref 30.0–36.0)
MCV: 91.2 fL (ref 80.0–100.0)
Monocytes Absolute: 1 10*3/uL (ref 0.1–1.0)
Monocytes Relative: 10 %
Neutro Abs: 6.5 10*3/uL (ref 1.7–7.7)
Neutrophils Relative %: 64 %
Platelets: 373 10*3/uL (ref 150–400)
RBC: 3.51 MIL/uL — ABNORMAL LOW (ref 4.22–5.81)
RDW: 15.3 % (ref 11.5–15.5)
WBC: 10.1 10*3/uL (ref 4.0–10.5)
nRBC: 0 % (ref 0.0–0.2)

## 2023-01-08 LAB — CBC
HCT: 35.4 % — ABNORMAL LOW (ref 39.0–52.0)
Hemoglobin: 11 g/dL — ABNORMAL LOW (ref 13.0–17.0)
MCH: 28.9 pg (ref 26.0–34.0)
MCHC: 31.1 g/dL (ref 30.0–36.0)
MCV: 93.2 fL (ref 80.0–100.0)
Platelets: 401 10*3/uL — ABNORMAL HIGH (ref 150–400)
RBC: 3.8 MIL/uL — ABNORMAL LOW (ref 4.22–5.81)
RDW: 15.3 % (ref 11.5–15.5)
WBC: 11.2 10*3/uL — ABNORMAL HIGH (ref 4.0–10.5)
nRBC: 0 % (ref 0.0–0.2)

## 2023-01-08 LAB — GLUCOSE, CAPILLARY
Glucose-Capillary: 139 mg/dL — ABNORMAL HIGH (ref 70–99)
Glucose-Capillary: 160 mg/dL — ABNORMAL HIGH (ref 70–99)
Glucose-Capillary: 158 mg/dL — ABNORMAL HIGH (ref 70–99)

## 2023-01-08 LAB — COMPREHENSIVE METABOLIC PANEL
ALT: 32 U/L (ref 0–44)
AST: 32 U/L (ref 15–41)
Albumin: 2.4 g/dL — ABNORMAL LOW (ref 3.5–5.0)
Alkaline Phosphatase: 113 U/L (ref 38–126)
Anion gap: 7 (ref 5–15)
BUN: 21 mg/dL (ref 8–23)
CO2: 26 mmol/L (ref 22–32)
Calcium: 8.3 mg/dL — ABNORMAL LOW (ref 8.9–10.3)
Chloride: 107 mmol/L (ref 98–111)
Creatinine, Ser: 1.94 mg/dL — ABNORMAL HIGH (ref 0.61–1.24)
GFR, Estimated: 35 mL/min — ABNORMAL LOW (ref >60)
Glucose, Bld: 148 mg/dL — ABNORMAL HIGH (ref 70–99)
Potassium: 4.1 mmol/L (ref 3.5–5.1)
Sodium: 140 mmol/L (ref 135–145)
Total Bilirubin: 1 mg/dL (ref 0.3–1.2)
Total Protein: 5.1 g/dL — ABNORMAL LOW (ref 6.5–8.1)

## 2023-01-08 LAB — TSH: TSH: 0.882 u[IU]/mL (ref 0.350–4.500)

## 2023-01-08 LAB — LACTIC ACID, PLASMA
Lactic Acid, Venous: 1.4 mmol/L (ref 0.5–1.9)
Lactic Acid, Venous: 0.9 mmol/L (ref 0.5–1.9)

## 2023-01-08 LAB — RETICULOCYTES
Immature Retic Fract: 21.8 % — ABNORMAL HIGH (ref 2.3–15.9)
RBC.: 3.73 MIL/uL — ABNORMAL LOW (ref 4.22–5.81)
Retic Count, Absolute: 82.4 10*3/uL (ref 19.0–186.0)
Retic Ct Pct: 2.2 % (ref 0.4–3.1)

## 2023-01-08 LAB — IRON AND TIBC
Iron: 43 ug/dL — ABNORMAL LOW (ref 45–182)
Saturation Ratios: 20 % (ref 17.9–39.5)
TIBC: 217 ug/dL — ABNORMAL LOW (ref 250–450)
UIBC: 174 ug/dL

## 2023-01-08 LAB — FOLATE: Folate: 19.1 ng/mL (ref >5.9)

## 2023-01-08 LAB — MAGNESIUM: Magnesium: 1.9 mg/dL (ref 1.7–2.4)

## 2023-01-08 LAB — VITAMIN B12: Vitamin B-12: 1084 pg/mL — ABNORMAL HIGH (ref 180–914)

## 2023-01-08 LAB — D-DIMER, QUANTITATIVE: D-Dimer, Quant: 0.88 ug/mL-FEU — ABNORMAL HIGH (ref 0.00–0.50)

## 2023-01-08 LAB — AMMONIA: Ammonia: 24 umol/L (ref 9–35)

## 2023-01-08 LAB — CK: Total CK: 90 U/L (ref 49–397)

## 2023-01-08 LAB — PREALBUMIN: Prealbumin: 12 mg/dL — ABNORMAL LOW (ref 18–38)

## 2023-01-08 LAB — SARS CORONAVIRUS 2 BY RT PCR: SARS Coronavirus 2 by RT PCR: NEGATIVE

## 2023-01-08 LAB — FERRITIN: Ferritin: 663 ng/mL — ABNORMAL HIGH (ref 24–336)

## 2023-01-08 MED ORDER — INSULIN ASPART 100 UNIT/ML IJ SOLN
0.0000 [IU] | Freq: Every day | INTRAMUSCULAR | Status: DC
  Administered 2023-01-09 – 2023-01-11 (×2): 2 [IU] via SUBCUTANEOUS

## 2023-01-08 MED ORDER — LACTATED RINGERS IV BOLUS
500.0000 mL | Freq: Once | INTRAVENOUS | Status: AC
  Administered 2023-01-08: 500 mL via INTRAVENOUS

## 2023-01-08 MED ORDER — POLYETHYLENE GLYCOL 3350 17 G PO PACK: 17.0000 g | PACK | Freq: Every day | ORAL | Status: DC | PRN

## 2023-01-08 MED ORDER — QUETIAPINE FUMARATE 25 MG PO TABS
25.0000 mg | ORAL_TABLET | Freq: Every day | ORAL | Status: DC
  Administered 2023-01-09 – 2023-01-11 (×3): 25 mg via ORAL
  Filled 2023-01-08 (×3): qty 1

## 2023-01-08 MED ORDER — DULOXETINE HCL 60 MG PO CPEP
90.0000 mg | ORAL_CAPSULE | Freq: Every morning | ORAL | Status: DC
  Administered 2023-01-08 – 2023-01-09 (×2): 90 mg via ORAL
  Filled 2023-01-08 (×2): qty 1

## 2023-01-08 MED ORDER — OXYCODONE HCL 5 MG PO TABS: 5.0000 mg | ORAL_TABLET | Freq: Three times a day (TID) | ORAL | Status: DC | PRN

## 2023-01-08 MED ORDER — ACETAMINOPHEN 650 MG RE SUPP: 650.0000 mg | Freq: Four times a day (QID) | RECTAL | Status: DC | PRN

## 2023-01-08 MED ORDER — INSULIN GLARGINE-YFGN 100 UNIT/ML ~~LOC~~ SOLN: 15.0000 [IU] | Freq: Every day | SUBCUTANEOUS | Status: DC

## 2023-01-08 MED ORDER — QUETIAPINE FUMARATE 25 MG PO TABS
25.0000 mg | ORAL_TABLET | Freq: Two times a day (BID) | ORAL | Status: DC
  Filled 2023-01-08: qty 1

## 2023-01-08 MED ORDER — BISACODYL 10 MG RE SUPP: 10.0000 mg | Freq: Every day | RECTAL | Status: DC | PRN

## 2023-01-08 MED ORDER — QUETIAPINE FUMARATE 25 MG PO TABS: 25.0000 mg | ORAL_TABLET | Freq: Every day | ORAL | Status: DC

## 2023-01-08 MED ORDER — APIXABAN 5 MG PO TABS
5.0000 mg | ORAL_TABLET | Freq: Two times a day (BID) | ORAL | Status: DC
  Administered 2023-01-08 – 2023-01-12 (×9): 5 mg via ORAL
  Filled 2023-01-08 (×10): qty 1

## 2023-01-08 MED ORDER — INSULIN ASPART 100 UNIT/ML IJ SOLN
0.0000 [IU] | Freq: Three times a day (TID) | INTRAMUSCULAR | Status: DC
  Administered 2023-01-08: 3 [IU] via SUBCUTANEOUS
  Administered 2023-01-08: 2 [IU] via SUBCUTANEOUS
  Administered 2023-01-09: 11 [IU] via SUBCUTANEOUS
  Administered 2023-01-09: 3 [IU] via SUBCUTANEOUS
  Administered 2023-01-10: 5 [IU] via SUBCUTANEOUS
  Administered 2023-01-10: 3 [IU] via SUBCUTANEOUS
  Administered 2023-01-10 – 2023-01-11 (×2): 8 [IU] via SUBCUTANEOUS
  Administered 2023-01-11: 11 [IU] via SUBCUTANEOUS
  Administered 2023-01-11: 3 [IU] via SUBCUTANEOUS
  Administered 2023-01-12: 8 [IU] via SUBCUTANEOUS
  Administered 2023-01-12: 5 [IU] via SUBCUTANEOUS

## 2023-01-08 MED ORDER — TAMSULOSIN HCL 0.4 MG PO CAPS
0.4000 mg | ORAL_CAPSULE | Freq: Every day | ORAL | Status: DC
  Administered 2023-01-08 – 2023-01-12 (×5): 0.4 mg via ORAL
  Filled 2023-01-08 (×5): qty 1

## 2023-01-08 MED ORDER — ONDANSETRON HCL 4 MG PO TABS: 4.0000 mg | ORAL_TABLET | Freq: Four times a day (QID) | ORAL | Status: DC | PRN

## 2023-01-08 MED ORDER — METHOCARBAMOL 1000 MG/10ML IJ SOLN: 500.0000 mg | Freq: Four times a day (QID) | INTRAVENOUS | Status: DC | PRN

## 2023-01-08 MED ORDER — ONDANSETRON HCL 4 MG/2ML IJ SOLN: 4.0000 mg | Freq: Four times a day (QID) | INTRAMUSCULAR | Status: DC | PRN

## 2023-01-08 MED ORDER — PANTOPRAZOLE SODIUM 40 MG PO TBEC
40.0000 mg | DELAYED_RELEASE_TABLET | Freq: Every morning | ORAL | Status: DC
  Administered 2023-01-08 – 2023-01-12 (×5): 40 mg via ORAL
  Filled 2023-01-08 (×5): qty 1

## 2023-01-08 MED ORDER — HYDROCODONE-ACETAMINOPHEN 5-325 MG PO TABS: 1.0000 | ORAL_TABLET | Freq: Three times a day (TID) | ORAL | Status: DC | PRN

## 2023-01-08 MED ORDER — HALOPERIDOL LACTATE 5 MG/ML IJ SOLN
2.0000 mg | Freq: Four times a day (QID) | INTRAMUSCULAR | Status: DC | PRN
  Administered 2023-01-08 (×2): 2 mg via INTRAVENOUS
  Filled 2023-01-08 (×2): qty 1

## 2023-01-08 MED ORDER — LORAZEPAM 2 MG/ML IJ SOLN
0.5000 mg | Freq: Once | INTRAMUSCULAR | Status: AC
  Administered 2023-01-08: 0.5 mg via INTRAVENOUS
  Filled 2023-01-08: qty 1

## 2023-01-08 MED ORDER — SENNOSIDES-DOCUSATE SODIUM 8.6-50 MG PO TABS: 1.0000 | ORAL_TABLET | Freq: Every evening | ORAL | Status: DC | PRN

## 2023-01-08 MED ORDER — HYDRALAZINE HCL 20 MG/ML IJ SOLN: 10.0000 mg | Freq: Four times a day (QID) | INTRAMUSCULAR | Status: DC | PRN

## 2023-01-08 MED ORDER — ATORVASTATIN CALCIUM 80 MG PO TABS
80.0000 mg | ORAL_TABLET | Freq: Every evening | ORAL | Status: DC
  Administered 2023-01-09 – 2023-01-11 (×3): 80 mg via ORAL
  Filled 2023-01-08 (×4): qty 1

## 2023-01-08 MED ORDER — ACETAMINOPHEN 325 MG PO TABS
650.0000 mg | ORAL_TABLET | Freq: Four times a day (QID) | ORAL | Status: DC
  Administered 2023-01-08 – 2023-01-12 (×13): 650 mg via ORAL
  Filled 2023-01-08 (×13): qty 2

## 2023-01-08 MED ORDER — ACETAMINOPHEN 500 MG PO TABS: 1000.0000 mg | ORAL_TABLET | Freq: Three times a day (TID) | ORAL | Status: DC | PRN

## 2023-01-08 MED ORDER — BASAGLAR KWIKPEN 100 UNIT/ML ~~LOC~~ SOPN: 35.0000 [IU] | PEN_INJECTOR | Freq: Every day | SUBCUTANEOUS | Status: DC

## 2023-01-08 MED ORDER — OXYCODONE HCL 5 MG PO TABS: 5.0000 mg | ORAL_TABLET | ORAL | Status: DC | PRN

## 2023-01-08 MED ORDER — SODIUM CHLORIDE 0.9 % IV SOLN: INTRAVENOUS | Status: DC

## 2023-01-08 MED ORDER — HYDRALAZINE HCL 25 MG PO TABS: 25.0000 mg | ORAL_TABLET | Freq: Four times a day (QID) | ORAL | Status: DC | PRN

## 2023-01-08 MED ORDER — AMLODIPINE BESYLATE 5 MG PO TABS
5.0000 mg | ORAL_TABLET | Freq: Every day | ORAL | Status: DC
  Administered 2023-01-08 – 2023-01-12 (×5): 5 mg via ORAL
  Filled 2023-01-08 (×5): qty 1

## 2023-01-08 MED ORDER — HYDROCODONE-ACETAMINOPHEN 5-325 MG PO TABS: 1.0000 | ORAL_TABLET | ORAL | Status: DC | PRN

## 2023-01-08 MED ORDER — BUDESONIDE 0.25 MG/2ML IN SUSP
0.2500 mg | Freq: Two times a day (BID) | RESPIRATORY_TRACT | Status: DC
  Administered 2023-01-08 – 2023-01-12 (×8): 0.25 mg via RESPIRATORY_TRACT
  Filled 2023-01-08 (×6): qty 2

## 2023-01-08 MED ORDER — ACETAMINOPHEN 325 MG PO TABS: 650.0000 mg | ORAL_TABLET | Freq: Four times a day (QID) | ORAL | Status: DC | PRN

## 2023-01-08 MED ORDER — LACTATED RINGERS IV SOLN: INTRAVENOUS | Status: DC

## 2023-01-08 MED ORDER — LORAZEPAM 1 MG PO TABS: 0.5000 mg | ORAL_TABLET | Freq: Once | ORAL | Status: DC | PRN

## 2023-01-08 MED ORDER — BUPROPION HCL ER (XL) 150 MG PO TB24
150.0000 mg | ORAL_TABLET | Freq: Every morning | ORAL | Status: DC
  Administered 2023-01-08 – 2023-01-12 (×5): 150 mg via ORAL
  Filled 2023-01-08 (×6): qty 1

## 2023-01-08 MED ORDER — GLUCERNA SHAKE PO LIQD
237.0000 mL | Freq: Three times a day (TID) | ORAL | Status: DC
  Administered 2023-01-08 – 2023-01-12 (×7): 237 mL via ORAL

## 2023-01-08 MED ORDER — QUETIAPINE FUMARATE 25 MG PO TABS: 50.0000 mg | ORAL_TABLET | Freq: Every day | ORAL | Status: DC

## 2023-01-08 NOTE — Assessment & Plan Note (Signed)
-   currently appears to be slightly on the dry side,  carefully follow fluid status and Cr rehydrate

## 2023-01-08 NOTE — ED Notes (Signed)
Carney Bern, LPN from [facility] Updated

## 2023-01-08 NOTE — Assessment & Plan Note (Signed)
Continue statins Lipitor 80 mg a day

## 2023-01-08 NOTE — Assessment & Plan Note (Signed)
-   most likely multifactorial secondary to combination of  mild dehydration, possible sundowning vs polypharmacy, given speech change will obtain MRI brain  - Will rehydrate   - look for a source of infection  - Hold contributing medications   MRI of the brain with and without contrast ordered    - neurological exam appears to be nonfocal but patient unable to cooperate fully   - VBG and ammonia ordered Discussed case with neurology patient is well familiar to her neurologist.  States there is no history of myasthenia gravis it has never been confirmed. Patient does have significant dementia at baseline and his presentation could be sundowning.  Patient did had abnormal MRI and needed repeating so we will repeat MRI with without contrast He have had multiple EEGs has been unremarkable. If MRI abnormal would recommend official neurology consult

## 2023-01-08 NOTE — Progress Notes (Signed)
Pt was agitated, trying to get out of bed. MD was notified and ordered Seroquel PO BID. Pt refused to get the oral medication, MD was notified and switched to IV Haldol PRN. Monitoring the pt now, bed alarm is on, bed on the lowest position, side rales up.

## 2023-01-08 NOTE — Progress Notes (Signed)
VASCULAR LAB    Bilateral lower extremity venous duplex has been performed.  See CV proc for preliminary results.   Mansour Balboa, RVT 01/08/2023, 12:00 PM

## 2023-01-08 NOTE — ED Notes (Signed)
Please update Wife [phone]

## 2023-01-08 NOTE — ED Notes (Signed)
ED TO INPATIENT HANDOFF REPORT   S Name/Age/Gender Fernando Hess 77 y.o. male Room/Bed: H012C/H012C  Code Status   Code Status: Full Code  Home/SNF/Other SNF Patient oriented to: self Is this baseline? Yes   Triage Complete: Triage complete  Chief Complaint Acute encephalopathy [G93.40]  Triage Note Per [facility] EMS pt coming from [facility]. Patients wife was speaking on the phone with patient and states patient was having increased aphasia from baseline. States patient recently diagnosed with UTI.   Ems vitals 130/70 HR 80-110 irregular 97% 2L Holiday baseline CBG 247    Allergies Allergies  Allergen Reactions   Cilostazol Swelling and Other (See Comments)    Edema    Dulaglutide Nausea And Vomiting and Other (See Comments)    TRULICITY   Levofloxacin Hives, Itching and Rash   Liraglutide Other (See Comments)    Severe fatigue & insomnia   Lisinopril Itching, Rash and Cough    Level of Care/Admitting Diagnosis ED Disposition     ED Disposition  Admit   Condition  --   Comment  Hospital Area: [facility] [[id number]]  Level of Care: Telemetry [5]  Admit to tele based on following criteria: Other see comments  Comments: tia  May place patient in observation at [facility] or [facility] if equivalent level of care is available:: No  Covid Evaluation: Asymptomatic - no recent exposure (last 10 days) testing not required  Diagnosis: Acute encephalopathy [161096]  Admitting Physician: Therisa Doyne [[id number]]  Attending Physician: Therisa Doyne [[id number]]          B Medical/Surgery History Past Medical History:  Diagnosis Date   Acute renal failure (ARF) 09/24/2015   Acute stress disorder 11/13/2021   Atrial premature complexes    CAD (coronary artery disease) of artery bypass graft    Early occlusion of saphenous vein graft to intermediate and marginal branch in February 2007 following bypass grafting     CAD (coronary artery disease), native coronary artery 2017   hx NSTEMI 09-24-2015  s/p  CABG x5 on 10-02-2015;  post op STEMI inferolateral wall,  SVG OM1 and SVG OM2 occluded, distal OM occlusion the calpruit, treated medically // Myoview 7/21: no ischemia, EF 65, low risk   CHF (congestive heart failure)    CKD (chronic kidney disease), stage III    Contusion of right knee 03/16/2020   Dyspnea    Elevated troponin    Erectile dysfunction    Esophageal reflux    History of atrial fibrillation    post op CABG 10-02-2015   History of kidney stones    History of non-ST elevation myocardial infarction (NSTEMI) 09/24/2015   s/p  CABG x5   History of ST elevation myocardial infarction (STEMI) 10/22/2015   inferior wall,  post op CABG 10-02-2015   Hyperlipidemia    Hypertension    Left ureteral stone    Mild atherosclerosis of both carotid arteries    Nephrolithiasis    per CT bilateral non-obstructive calculi   OSA (obstructive sleep apnea)    Peripheral artery disease    LE Arterial US 01/2019: R PTA and ATA occluded; L ATA occluded   RBBB (right bundle branch block)    Renal atrophy, right    Sleep apnea    wears cpap    ST elevation myocardial infarction (STEMI) of inferior wall 10/22/2015   Type 2 diabetes mellitus treated with insulin    followed by pcp   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 03/01/2008   Wears glasses    Past Surgical History:  Procedure Laterality Date   APPENDECTOMY  1965   CARDIAC CATHETERIZATION N/A 09/26/2015   Procedure: Left Heart Cath and Coronary Angiography;  Surgeon: Lennette Bihari, MD;  Location: [facility] INVASIVE CV LAB;  Service: Cardiovascular;  Laterality: N/A;   CARDIAC CATHETERIZATION N/A 10/22/2015   Procedure: Left Heart Cath and Coronary Angiography;  Surgeon: Tonny Bollman, MD;  Location: [facility] INVASIVE CV LAB;  Service: Cardiovascular;  Laterality: N/A;   CATARACT  EXTRACTION W/ INTRAOCULAR LENS  IMPLANT, BILATERAL  2017   COLONOSCOPY     CORONARY ARTERY BYPASS GRAFT N/A 10/02/2015   Procedure: CORONARY ARTERY BYPASS GRAFTING (CABG) X5 LIMA-LAD; SVG-DIAG; SVG-OM; SVG-PD; SVG-RAMUS TRANSESOPHAGEAL ECHOCARDIOGRAM (TEE) ENDOSCOPIC GREATER SAPHENOUS VEIN  HARVEST BILAT LE;  Surgeon: Kerin Perna, MD;  Location: [facility] OR;  Service: Open Heart Surgery;  Laterality: N/A;   CYSTOSCOPY/URETEROSCOPY/HOLMIUM LASER/STENT PLACEMENT Left 08/10/2018   Procedure: CYSTOSCOPY/URETEROSCOPY/HOLMIUM LASER/STENT PLACEMENT;  Surgeon: Jerilee Field, MD;  Location: [facility];  Service: Urology;  Laterality: Left;   CYSTOSCOPY/URETEROSCOPY/HOLMIUM LASER/STENT PLACEMENT Left 09/10/2018   Procedure: CYSTOSCOPY/URETEROSCOPY/HOLMIUM LASER/STENT EXCHANGE;  Surgeon: Jerilee Field, MD;  Location: [facility] ORS;  Service: Urology;  Laterality: Left;   HIP ARTHROPLASTY Right 12/26/2022   Procedure: RIGHT HEMI HIP ARTHROPLASTY;  Surgeon: Joen Laura, MD;  Location: [facility] OR;  Service: Orthopedics;  Laterality: Right;   LEFT HEART CATHETERIZATION WITH CORONARY ANGIOGRAM N/A 04/13/2014   Procedure: LEFT HEART CATHETERIZATION WITH CORONARY ANGIOGRAM;  Surgeon: Othella Boyer, MD;  Location: [facility] CATH LAB;  Service: Cardiovascular;  Laterality: N/A;   LEG SURGERY Right age 47   closed reduction leg fracture   NASAL SEPTOPLASTY W/ TURBINOPLASTY Bilateral 08/30/2021   Procedure: NASAL SEPTOPLASTY WITH BILATERAL INFERIOR TURBINATE REDUCTION;  Surgeon: Osborn Coho, MD;  Location: [facility] OR;  Service: ENT;  Laterality: Bilateral;   POLYPECTOMY     RIGHT HEART CATH N/A 06/06/2021   Procedure: RIGHT HEART CATH;  Surgeon: Dolores Patty, MD;  Location: [facility] INVASIVE CV LAB;  Service: Cardiovascular;  Laterality: N/A;   TEE WITHOUT CARDIOVERSION N/A 10/02/2015   Procedure: TRANSESOPHAGEAL ECHOCARDIOGRAM (TEE);  Surgeon: Kerin Perna, MD;  Location: [facility] OR;  Service: Open Heart  Surgery;  Laterality: N/A;   URETEROSCOPY WITH HOLMIUM LASER LITHOTRIPSY Bilateral 2004;  2005  dr Isabel Caprice     VASECTOMY       A IV Location/Drains/Wounds Patient Lines/Drains/Airways Status     Active Line/Drains/Airways     Name Placement date Placement time Site Days   Peripheral IV 01/07/23 18 G Anterior;Distal;Left;Upper Arm 01/07/23  1643  Arm  1   Wound / Incision (Open or Dehisced) 12/31/22 Buttocks Left;Medial Partial thickness loss of dermis presenting as a shallow open ulcer with a red or pink wound bed, without slough or bruising. 12/31/22  2337  Buttocks  8            Intake/Output Last 24 hours No intake or output data in the 24 hours ending 01/08/23 0056  Labs/Imaging Results for orders placed or performed during the hospital encounter of 01/07/23 (from the past 48 hour(s))  CBC with Differential     Status: Abnormal   Collection Time: 01/07/23  4:38 PM  Result Value Ref Range   WBC 10.0 4.0 - 10.5 K/uL   RBC 3.79 (L) 4.22 - 5.81 MIL/uL   Hemoglobin 11.0 (L) 13.0 -  17.0 g/dL   HCT 16.1 (L) 09.6 - 04.5 %   MCV 90.2 80.0 - 100.0 fL   MCH 29.0 26.0 - 34.0 pg   MCHC 32.2 30.0 - 36.0 g/dL   RDW 40.9 81.1 - 91.4 %   Platelets 399 150 - 400 K/uL   nRBC 0.0 0.0 - 0.2 %   Neutrophils Relative % 67 %   Neutro Abs 6.8 1.7 - 7.7 K/uL   Lymphocytes Relative 16 %   Lymphs Abs 1.6 0.7 - 4.0 K/uL   Monocytes Relative 11 %   Monocytes Absolute 1.1 (H) 0.1 - 1.0 K/uL   Eosinophils Relative 3 %   Eosinophils Absolute 0.3 0.0 - 0.5 K/uL   Basophils Relative 1 %   Basophils Absolute 0.1 0.0 - 0.1 K/uL   Immature Granulocytes 2 %   Abs Immature Granulocytes 0.18 (H) 0.00 - 0.07 K/uL    Comment: Performed at [facility], [address], [place], [name] [address]  Comprehensive metabolic panel     Status: Abnormal   Collection Time: 01/07/23  4:38 PM  Result Value Ref Range   Sodium 139 135 - 145 mmol/L   Potassium 5.0 3.5 - 5.1 mmol/L   Chloride 103 98 -  111 mmol/L   CO2 26 22 - 32 mmol/L   Glucose, Bld 286 (H) 70 - 99 mg/dL    Comment: Glucose reference range applies only to samples taken after fasting for at least 8 hours.   BUN 26 (H) 8 - 23 mg/dL   Creatinine, Ser 6.21 (H) 0.61 - 1.24 mg/dL   Calcium 8.6 (L) 8.9 - 10.3 mg/dL   Total Protein 5.2 (L) 6.5 - 8.1 g/dL   Albumin 2.4 (L) 3.5 - 5.0 g/dL   AST 39 15 - 41 U/L   ALT 31 0 - 44 U/L   Alkaline Phosphatase 128 (H) 38 - 126 U/L   Total Bilirubin 0.9 0.3 - 1.2 mg/dL   GFR, Estimated 32 (L) >60 mL/min    Comment: (NOTE) Calculated using the CKD-EPI Creatinine Equation (2021)    Anion gap 10 5 - 15    Comment: Performed at [facility], [address] [place], [name] [address]  Ethanol     Status: None   Collection Time: 01/07/23  4:38 PM  Result Value Ref Range   Alcohol, Ethyl (B) <10 <10 mg/dL    Comment: (NOTE) Lowest detectable limit for serum alcohol is 10 mg/dL.  For medical purposes only. Performed at [facility], [address], [place], [name] [address]   Magnesium     Status: None   Collection Time: 01/07/23  4:38 PM  Result Value Ref Range   Magnesium 2.0 1.7 - 2.4 mg/dL    Comment: Performed at [facility], [address] [place], [name] [address]  Troponin I (High Sensitivity)     Status: None   Collection Time: 01/07/23  4:38 PM  Result Value Ref Range   Troponin I (High Sensitivity) 14 <18 ng/L    Comment: (NOTE) Elevated high sensitivity troponin I (hsTnI) values and significant  changes across serial measurements may suggest ACS but many other  chronic and acute conditions are known to elevate hsTnI results.  Refer to the "Links" section for chest pain algorithms and additional  guidance. Performed at [facility], [address], [place], [name] [address]   POC CBG, ED     Status: Abnormal  Collection Time: 01/07/23  4:49 PM  Result Value Ref Range   Glucose-Capillary 257 (H) 70 - 99 mg/dL    Comment: Glucose  reference range applies only to samples taken after fasting for at least 8 hours.  Troponin I (High Sensitivity)     Status: None   Collection Time: 01/07/23  8:02 PM  Result Value Ref Range   Troponin I (High Sensitivity) 14 <18 ng/L    Comment: (NOTE) Elevated high sensitivity troponin I (hsTnI) values and significant  changes across serial measurements may suggest ACS but many other  chronic and acute conditions are known to elevate hsTnI results.  Refer to the "Links" section for chest pain algorithms and additional  guidance. Performed at [facility], [address] [place], [name] [address]   Rapid urine drug screen (hospital performed)     Status: None   Collection Time: 01/07/23 10:03 PM  Result Value Ref Range   Opiates NONE DETECTED NONE DETECTED   Cocaine NONE DETECTED NONE DETECTED   Benzodiazepines NONE DETECTED NONE DETECTED   Amphetamines NONE DETECTED NONE DETECTED   Tetrahydrocannabinol NONE DETECTED NONE DETECTED   Barbiturates NONE DETECTED NONE DETECTED    Comment: (NOTE) DRUG SCREEN FOR MEDICAL PURPOSES ONLY.  IF CONFIRMATION IS NEEDED FOR ANY PURPOSE, NOTIFY LAB WITHIN 5 DAYS.  LOWEST DETECTABLE LIMITS FOR URINE DRUG SCREEN Drug Class                     Cutoff (ng/mL) Amphetamine and metabolites    1000 Barbiturate and metabolites    200 Benzodiazepine                 200 Opiates and metabolites        300 Cocaine and metabolites        300 THC                            50 Performed at [facility], [address] [place], [name] [address]   Urinalysis, Routine w reflex microscopic -Urine, Clean Catch     Status: Abnormal   Collection Time: 01/07/23 10:03 PM  Result Value Ref Range   Color, Urine AMBER (A) YELLOW    Comment: BIOCHEMICALS MAY BE AFFECTED BY COLOR   APPearance HAZY (A) CLEAR   Specific Gravity, Urine 1.018 1.005 - 1.030   pH 5.0 5.0 - 8.0   Glucose, UA NEGATIVE NEGATIVE mg/dL   Hgb urine dipstick LARGE (A)  NEGATIVE   Bilirubin Urine NEGATIVE NEGATIVE   Ketones, ur NEGATIVE NEGATIVE mg/dL   Protein, ur 098 (A) NEGATIVE mg/dL   Nitrite NEGATIVE NEGATIVE   Leukocytes,Ua SMALL (A) NEGATIVE   RBC / HPF >50 0 - 5 RBC/hpf   WBC, UA 11-20 0 - 5 WBC/hpf   Bacteria, UA RARE (A) NONE SEEN   Squamous Epithelial / HPF 0-5 0 - 5 /HPF   Mucus PRESENT    Hyaline Casts, UA PRESENT     Comment: Performed at [facility], [address] [place], [name] [address]  D-dimer, quantitative     Status: Abnormal   Collection Time: 01/07/23 11:38 PM  Result Value Ref Range   D-Dimer, Quant 0.88 (H) 0.00 - 0.50 ug/mL-FEU    Comment: (NOTE) At the manufacturer cut-off value of 0.5 g/mL FEU, this assay has a negative predictive value of 95-100%.This assay is intended for use in conjunction with  a clinical pretest probability (PTP) assessment model to exclude pulmonary embolism (PE) and deep venous thrombosis (DVT) in outpatients suspected of PE or DVT. Results should be correlated with clinical presentation. Performed at [facility], [address], [place], [name] [address]   I-Stat venous blood gas, ED     Status: Abnormal   Collection Time: 01/08/23 12:12 AM  Result Value Ref Range   pH, Ven 7.376 7.25 - 7.43   pCO2, Ven 48.4 44 - 60 mmHg   pO2, Ven 40 32 - 45 mmHg   Bicarbonate 28.4 (H) 20.0 - 28.0 mmol/L   TCO2 30 22 - 32 mmol/L   O2 Saturation 72 %   Acid-Base Excess 3.0 (H) 0.0 - 2.0 mmol/L   Sodium 141 135 - 145 mmol/L   Potassium 4.3 3.5 - 5.1 mmol/L   Calcium, Ion 1.20 1.15 - 1.40 mmol/L   HCT 32.0 (L) 39.0 - 52.0 %   Hemoglobin 10.9 (L) 13.0 - 17.0 g/dL   Sample type VENOUS    DG Ankle 2 Views Left  Result Date: 01/08/2023 CLINICAL DATA:  Left ankle pain. EXAM: LEFT ANKLE - 2 VIEW COMPARISON:  None Available. FINDINGS: There is no evidence of fracture, dislocation, or joint effusion. There is no evidence of arthropathy or other focal bone abnormality. Mild vascular  calcification is seen. IMPRESSION: No acute osseous abnormality. Electronically Signed   By: Aram Candela M.D.   On: 01/08/2023 00:31   DG HIP UNILAT WITH PELVIS MIN 4 VIEWS RIGHT  Result Date: 01/08/2023 CLINICAL DATA:  Right hip pain. EXAM: DG HIP (WITH OR WITHOUT PELVIS) 4+V RIGHT COMPARISON:  None Available. FINDINGS: A total right hip replacement is seen. There is no evidence of surrounding lucency to suggest the presence of hardware loosening or infection. There is no evidence of an acute fracture or dislocation. IMPRESSION: Total right hip replacement without evidence of hardware loosening or infection. Electronically Signed   By: Aram Candela M.D.   On: 01/08/2023 00:30   DG Chest 2 View  Result Date: 01/08/2023 CLINICAL DATA:  Constipation. Altered mental status, encephalopathy. EXAM: CHEST - 2 VIEW; ABDOMEN - 1 VIEW COMPARISON:  None Available. FINDINGS: The heart size and mediastinal contours are within normal limits. Lung volumes are low. No consolidation, effusion, or pneumothorax. Sternotomy wires are present over the midline. Degenerative changes are noted in the thoracic spine. No acute osseous abnormality. There is a nonobstructive bowel-gas pattern. A moderate amount of retained stool is present in the colon. No definite renal calculus is seen. Total hip arthroplasty changes are present in the right. IMPRESSION: 1. No active cardiopulmonary disease. 2. No evidence of bowel obstruction. 3. Moderate amount of retained stool in the colon. Electronically Signed   By: Thornell Sartorius M.D.   On: 01/08/2023 00:30   DG Abd 1 View  Result Date: 01/08/2023 CLINICAL DATA:  Constipation. Altered mental status, encephalopathy. EXAM: CHEST - 2 VIEW; ABDOMEN - 1 VIEW COMPARISON:  None Available. FINDINGS: The heart size and mediastinal contours are within normal limits. Lung volumes are low. No consolidation, effusion, or pneumothorax. Sternotomy wires are present over the midline.  Degenerative changes are noted in the thoracic spine. No acute osseous abnormality. There is a nonobstructive bowel-gas pattern. A moderate amount of retained stool is present in the colon. No definite renal calculus is seen. Total hip arthroplasty changes are present in the right. IMPRESSION: 1. No active cardiopulmonary disease. 2. No evidence of bowel obstruction. 3. Moderate amount  of retained stool in the colon. Electronically Signed   By: Thornell Sartorius M.D.   On: 01/08/2023 00:30   CT Head Wo Contrast  Result Date: 01/07/2023 CLINICAL DATA:  Increased aphasia EXAM: CT HEAD WITHOUT CONTRAST TECHNIQUE: Contiguous axial images were obtained from the base of the skull through the vertex without intravenous contrast. RADIATION DOSE REDUCTION: This exam was performed according to the departmental dose-optimization program which includes automated exposure control, adjustment of the mA and/or kV according to patient size and/or use of iterative reconstruction technique. COMPARISON:  01/04/2023 FINDINGS: Brain: No evidence of acute infarction, hemorrhage, mass, mass effect, or midline shift. No hydrocephalus or extra-axial fluid collection. Mildly advanced cerebral atrophy for age. Periventricular white matter changes, likely the sequela of chronic small vessel ischemic disease. Vascular: No hyperdense vessel. Atherosclerotic calcifications in the intracranial carotid and vertebral arteries. Skull: Negative for fracture or focal lesion. Sinuses/Orbits: Mucous retention cysts in the maxillary sinuses. Status post bilateral lens replacements. Other: The mastoid air cells are well aerated. IMPRESSION: No acute intracranial process. Electronically Signed   By: Wiliam Ke M.D.   On: 01/07/2023 18:48    Pending Labs Unresulted Labs (From admission, onward)     Start     Ordered   01/08/23 0500  Prealbumin  Tomorrow morning,   R        01/07/23 2338   01/07/23 2338  CK  Add-on,   AD        01/07/23 2338    01/07/23 2338  CBC with Differential/Platelet  Add-on,   AD       Question:  Release to patient  Answer:  Immediate   01/07/23 2338   01/07/23 2338  Lactic acid, plasma  STAT Now then every 3 hours,   R     Question:  Release to patient  Answer:  Immediate   01/07/23 2338   01/07/23 2338  Magnesium  Add-on,   AD        01/07/23 2338   01/07/23 2338  Phosphorus  Add-on,   AD        01/07/23 2338   01/07/23 2338  TSH  Add-on,   AD        01/07/23 2338   01/07/23 2338  Blood gas, venous  ONCE - STAT,   STAT       Question:  Release to patient  Answer:  Immediate   01/07/23 2338   01/07/23 2334  SARS Coronavirus 2 by RT PCR (hospital order, performed in [facility] hospital lab) *cepheid single result test* Anterior Nasal Swab  (Tier 2 - SARS Coronavirus 2 by RT PCR (hospital order, performed in [facility] hospital lab) *cepheid single result test*)  Once,   URGENT        01/07/23 2333   01/07/23 2311  Urine Culture  Add-on,   AD       Question:  Indication  Answer:  Altered mental status (if no other cause identified)   01/07/23 2310   Signed and Held  Magnesium  Tomorrow morning,   R        Signed and Held   Signed and Held  Phosphorus  Tomorrow morning,   R        Signed and Held   Signed and Held  Comprehensive metabolic panel  Tomorrow morning,   R       Question:  Release to patient  Answer:  Immediate   Signed and Held   Signed and  Held  CBC  Tomorrow morning,   R       Question:  Release to patient  Answer:  Immediate   Signed and Held            Vitals/Pain Today's Vitals   01/07/23 2345 01/08/23 0015 01/08/23 0030 01/08/23 0045  BP: 131/81 98/79 115/71 (!) 130/90  Pulse: 96 (!) 46 95 98  Resp: 20 (!) 23 19 17   Temp:      TempSrc:      SpO2: 97% 100% 96% 95%  Weight:      Height:      PainSc:        Isolation Precautions Airborne and Contact precautions  Medications Medications  QUEtiapine (SEROQUEL) tablet 50 mg (50 mg Oral Given 01/07/23 2333)   methocarbamol (ROBAXIN) 500 mg in dextrose 5 % 50 mL IVPB (has no administration in time range)  bisacodyl (DULCOLAX) suppository 10 mg (has no administration in time range)    Mobility non-ambulatory     Focused Assessments Neuro Assessment Handoff:  Swallow screen pass? Yes          Neuro Assessment:   Neuro Checks:      Has TPA been given? No If patient is a Neuro Trauma and patient is going to OR before floor call report to 4N Charge nurse: [phone] or [phone]   R Recommendations: See Admitting Provider Note

## 2023-01-08 NOTE — Assessment & Plan Note (Signed)
-   Order Sensitive SSI   - continue home insulin but decreased to  35units,  -  check TSH and HgA1C

## 2023-01-08 NOTE — Assessment & Plan Note (Signed)
-  chronic avoid nephrotoxic medications such as NSAIDs, Vanco Zosyn combo,  avoid hypotension, continue to follow renal function  

## 2023-01-08 NOTE — Assessment & Plan Note (Signed)
Noted slight edema of the left ankle. Obtain plain images and Dopplers to rule out DVT although less likely patient is on Eliquis

## 2023-01-08 NOTE — Progress Notes (Signed)
PROGRESS NOTE  Fernando Hess WUJ:[id number] DOB: 09/30/1945   PCP: [name], MD  Patient is from: Facility  DOA: 01/07/2023 LOS: 0  Chief complaints Chief Complaint  Patient presents with   Altered Mental Status     Brief Narrative / Interim history: 77 year old M with PMH of dementia, CAD, diastolic CHF, OSA and hypoxic RF on 2 L, paroxysmal A-fib, CKD-3B, DM-2 and recent right hip fracture and repair brought to ED due to confusion and slurred speech and admitted for acute encephalopathy.  Workup in ED including vitals, COVID-19 PCR, CTH, VBG, CXR, CMP, CBC, TSH and UDS unrevealing.  UA with hematuria.  Concern about polypharmacy (pain meds), delirium and dehydration.  MRI brain ordered.  Admitted for further evaluation and care.  Subjective: Seen and examined earlier this morning.  No major events overnight of this morning.  Very restless.  Seems to be confused also he is oriented to self, place and follows command.  Not oriented to time.  Responds no to pain.  Objective: Vitals:   01/08/23 0545 01/08/23 0630 01/08/23 0749 01/08/23 0920  BP: (!) 209/194 137/83  (!) 163/123  Pulse: 90 (!) 101    Resp: 20 20    Temp:   98.4 F (36.9 C)   TempSrc:   Axillary   SpO2: 91% 94%    Weight:      Height:        Examination:  GENERAL: No apparent distress.  Nontoxic. HEENT: MMM.  Vision and hearing grossly intact.  NECK: Supple.  No apparent JVD.  RESP:  No IWOB.  Fair aeration bilaterally. CVS:  RRR. Heart sounds normal.  ABD/GI/GU: BS+. Abd soft, NTND.  MSK/EXT:  Moves extremities. No apparent deformity. No edema.  SKIN: Surgical wound of the right hip DCI. NEURO: Awake and oriented to self, place and follows commands.  No apparent focal neuro deficit. PSYCH: Somewhat restless.  Procedures:  None  Microbiology summarized: COVID-19 PCR nonreactive  Assessment and plan: Principal Problem:   Acute encephalopathy Active Problems:   Fracture of femoral neck,  right, closed   Paroxysmal atrial fibrillation   Coronary artery disease involving native coronary artery of native heart without angina pectoris   Chronic heart failure with preserved ejection fraction (HFpEF)   Diabetes mellitus, type 2   Chronic kidney disease, stage 3b   Hyperlipidemia LDL goal <70   OSA on CPAP   Constipation   Depression with anxiety   Left ankle pain   Hematuria  Acute encephalopathy/dementia with behavioral disturbance: Multifactorial including polypharmacy (pain meds), dehydration, delirium and underlying dementia.  No focal neurodeficit to suggest CVA.  Low suspicion for infectious process. COVID-19 PCR, CTH, VBG, CXR, CMP, CBC, TSH and UDS and UA unrevealing.  He is currently awake and oriented to self and place and follows command but restless and confused. -Reorientation and delirium precaution -Check ammonia level -Follow MRI brain -Discontinue Norco and Robaxin.  Decrease oxycodone and Seroquel.[name]  CAD/chronic diastolic CHF: Appears dehydrated.  TTE on 4/16 with LVEF of 60 to 65%, G1-DD.  Does not seem to be on diuretics. -Monitor respiratory status and fluid status while on IV fluid  Paroxysmal A-fib: Rate controlled without medication. -Continue home Eliquis  Chronic hypoxic RF/OSA-seems he is on 2 L at baseline.  Stable.  CKD-3B: Stable. Recent Labs    12/24/22 1612 12/25/22 0126 12/26/22 0847 12/27/22 1154 12/29/22 0203 12/31/22 0952 01/01/23 0316 01/04/23 0625 01/07/23 1638 01/08/23 0735  BUN 19 23 39* 42* 31*  32* 33* 28* 26* 21  CREATININE 1.88* 2.19* 2.61* 2.26* 2.09* 1.87* 1.91* 2.07* 2.10* 1.94*  -Avoid nephrotoxic meds  Essential hypertension? Blood pressure measurements unreliable due to restlessness.  Does not seem to be on medication at home. -Low-dose amlodipine -Hydralazine as needed  Right hip fracture s/p repair on 4/12: Surgical wound appears DCI.  X-ray without evidence of hardware loosening or infection. -PT/OT -Pain  control with scheduled Tylenol  IDDM-2 with hyperglycemia: A1c 10.7% on 1/4.[name] Recent Labs  Lab 01/07/23 1649  GLUCAP 257*  -Start SSI-moderate -Recheck hemoglobin A1c  Constipation?  KUB without significant finding. -Bowel regimen  Anxiety and depression -Continue home meds.  Left ankle pain-x-ray without significant finding.  Exam reassuring.  General tremor/rigidity: Patient reported myasthenia gravis but this has never been confirmed and neurology does not feel that his testing is consistent with MG  -Outpatient follow-up as previously planned  BPH -Continue Flomax   Body mass index is 24.53 kg/m.           DVT prophylaxis:  SCDs Start: 01/08/23 1038 apixaban (ELIQUIS) tablet 5 mg  Code Status: Full code Family Communication: None at bedside Level of care: Telemetry Medical Status is: Observation The patient will require care spanning > 2 midnights and should be moved to inpatient because: Acute encephalopathy   Final disposition: TBD Consultants:  None  55 minutes with more than 50% spent in reviewing records, counseling patient/family and coordinating care.   Sch Meds:  Scheduled Meds:  acetaminophen  650 mg Oral Q6H WA   amLODipine  5 mg Oral Daily   apixaban  5 mg Oral BID   atorvastatin  80 mg Oral QPM   budesonide  0.25 mg Nebulization BID   buPROPion  150 mg Oral q morning   DULoxetine  90 mg Oral q morning   feeding supplement (GLUCERNA SHAKE)  237 mL Oral TID BM   insulin aspart  0-15 Units Subcutaneous TID WC   insulin aspart  0-5 Units Subcutaneous QHS   pantoprazole  40 mg Oral q AM   QUEtiapine  25 mg Oral QHS   tamsulosin  0.4 mg Oral QPC breakfast   Continuous Infusions:  lactated ringers 75 mL/hr at 01/08/23 1028   PRN Meds:.bisacodyl, hydrALAZINE, ondansetron **OR** ondansetron (ZOFRAN) IV, oxyCODONE, polyethylene glycol, senna-docusate  Antimicrobials: Anti-infectives (From admission, onward)    None        I have  personally reviewed the following labs and images: CBC: Recent Labs  Lab 01/04/23 0625 01/07/23 1638 01/07/23 2338 01/08/23 0012 01/08/23 0735  WBC 12.5* 10.0 10.1  --  11.2*  NEUTROABS 8.1* 6.8 6.5  --   --   HGB 11.2* 11.0* 10.5* 10.9* 11.0*  HCT 35.9* 34.2* 32.0* 32.0* 35.4*  MCV 92.1 90.2 91.2  --  93.2  PLT 318 399 373  --  401*   BMP &GFR Recent Labs  Lab 01/04/23 0625 01/07/23 1638 01/07/23 2338 01/08/23 0012 01/08/23 0735  NA 138 139  --  141 140  K 4.4 5.0  --  4.3 4.1  CL 108 103  --   --  107  CO2 25 26  --   --  26  GLUCOSE 177* 286*  --   --  148*  BUN 28* 26*  --   --  21  CREATININE 2.07* 2.10*  --   --  1.94*  CALCIUM 8.3* 8.6*  --   --  8.3*  MG  --  2.0 1.9  --   --  PHOS  --   --  3.0  --   --    Estimated Creatinine Clearance: 28.3 mL/min (A) (by C-G formula based on SCr of 1.94 mg/dL (H)). Liver & Pancreas: Recent Labs  Lab 01/04/23 0625 01/07/23 1638 01/08/23 0735  AST 31 39 32  ALT 33 31 32  ALKPHOS 107 128* 113  BILITOT 0.9 0.9 1.0  PROT 5.4* 5.2* 5.1*  ALBUMIN 2.4* 2.4* 2.4*   No results for input(s): "LIPASE", "AMYLASE" in the last 168 hours. No results for input(s): "AMMONIA" in the last 168 hours. Diabetic: No results for input(s): "HGBA1C" in the last 72 hours. Recent Labs  Lab 01/07/23 1649  GLUCAP 257*   Cardiac Enzymes: Recent Labs  Lab 01/07/23 2338  CKTOTAL 90   No results for input(s): "PROBNP" in the last 8760 hours. Coagulation Profile: Recent Labs  Lab 01/04/23 0625  INR 1.6*   Thyroid Function Tests: Recent Labs    01/07/23 2338  TSH 0.882   Lipid Profile: No results for input(s): "CHOL", "HDL", "LDLCALC", "TRIG", "CHOLHDL", "LDLDIRECT" in the last 72 hours. Anemia Panel: Recent Labs    01/08/23 0735 01/08/23 0811  VITAMINB12 1,084*  --   FOLATE 19.1  --   FERRITIN 663*  --   TIBC 217*  --   IRON 43*  --   RETICCTPCT  --  2.2   Urine analysis:    Component Value Date/Time   COLORURINE  AMBER (A) 01/07/2023 2203   APPEARANCEUR HAZY (A) 01/07/2023 2203   LABSPEC 1.018 01/07/2023 2203   PHURINE 5.0 01/07/2023 2203   GLUCOSEU NEGATIVE 01/07/2023 2203   HGBUR LARGE (A) 01/07/2023 2203   BILIRUBINUR NEGATIVE 01/07/2023 2203   KETONESUR NEGATIVE 01/07/2023 2203   PROTEINUR 100 (A) 01/07/2023 2203   UROBILINOGEN 0.2 10/02/2009 1604   NITRITE NEGATIVE 01/07/2023 2203   LEUKOCYTESUR SMALL (A) 01/07/2023 2203   Sepsis Labs: Invalid input(s): "PROCALCITONIN", "LACTICIDVEN"  Microbiology: Recent Results (from the past 240 hour(s))  SARS Coronavirus 2 by RT PCR (hospital order, performed in [facility] hospital lab) *cepheid single result test* Anterior Nasal Swab     Status: None   Collection Time: 01/07/23 11:38 PM   Specimen: Anterior Nasal Swab  Result Value Ref Range Status   SARS Coronavirus 2 by RT PCR NEGATIVE NEGATIVE Final    Comment: Performed at [facility], [address], [place], [name] [address]    Radiology Studies: US RENAL  Result Date: 01/08/2023 CLINICAL DATA:  Hematuria EXAM: RENAL / URINARY TRACT ULTRASOUND COMPLETE COMPARISON:  03/10/2022 FINDINGS: Right Kidney: Renal measurements: 10.3 x 5.3 x 4.8 cm = volume: 137 mL. Normal echotexture. No mass or hydronephrosis Left Kidney: Renal measurements: 7.9 x 3.8 x 4.0 cm = volume: 62 mL. Pelvic kidney. 2.5 cm upper pole cyst is stable since prior study. Normal echotexture. No mass or hydronephrosis. Bladder: Appears normal for degree of bladder distention. Other: None. IMPRESSION: No acute findings.  No hydronephrosis. Electronically Signed   By: Charlett Nose M.D.   On: 01/08/2023 01:16   DG Ankle 2 Views Left  Result Date: 01/08/2023 CLINICAL DATA:  Left ankle pain. EXAM: LEFT ANKLE - 2 VIEW COMPARISON:  None Available. FINDINGS: There is no evidence of fracture, dislocation, or joint effusion. There is no evidence of arthropathy or other focal bone abnormality. Mild vascular calcification is seen.  IMPRESSION: No acute osseous abnormality. Electronically Signed   By: Aram Candela M.D.   On: 01/08/2023 00:31  DG HIP UNILAT WITH PELVIS MIN 4 VIEWS RIGHT  Result Date: 01/08/2023 CLINICAL DATA:  Right hip pain. EXAM: DG HIP (WITH OR WITHOUT PELVIS) 4+V RIGHT COMPARISON:  None Available. FINDINGS: A total right hip replacement is seen. There is no evidence of surrounding lucency to suggest the presence of hardware loosening or infection. There is no evidence of an acute fracture or dislocation. IMPRESSION: Total right hip replacement without evidence of hardware loosening or infection. Electronically Signed   By: Aram Candela M.D.   On: 01/08/2023 00:30   DG Chest 2 View  Result Date: 01/08/2023 CLINICAL DATA:  Constipation. Altered mental status, encephalopathy. EXAM: CHEST - 2 VIEW; ABDOMEN - 1 VIEW COMPARISON:  None Available. FINDINGS: The heart size and mediastinal contours are within normal limits. Lung volumes are low. No consolidation, effusion, or pneumothorax. Sternotomy wires are present over the midline. Degenerative changes are noted in the thoracic spine. No acute osseous abnormality. There is a nonobstructive bowel-gas pattern. A moderate amount of retained stool is present in the colon. No definite renal calculus is seen. Total hip arthroplasty changes are present in the right. IMPRESSION: 1. No active cardiopulmonary disease. 2. No evidence of bowel obstruction. 3. Moderate amount of retained stool in the colon. Electronically Signed   By: Thornell Sartorius M.D.   On: 01/08/2023 00:30   DG Abd 1 View  Result Date: 01/08/2023 CLINICAL DATA:  Constipation. Altered mental status, encephalopathy. EXAM: CHEST - 2 VIEW; ABDOMEN - 1 VIEW COMPARISON:  None Available. FINDINGS: The heart size and mediastinal contours are within normal limits. Lung volumes are low. No consolidation, effusion, or pneumothorax. Sternotomy wires are present over the midline. Degenerative changes are noted in  the thoracic spine. No acute osseous abnormality. There is a nonobstructive bowel-gas pattern. A moderate amount of retained stool is present in the colon. No definite renal calculus is seen. Total hip arthroplasty changes are present in the right. IMPRESSION: 1. No active cardiopulmonary disease. 2. No evidence of bowel obstruction. 3. Moderate amount of retained stool in the colon. Electronically Signed   By: Thornell Sartorius M.D.   On: 01/08/2023 00:30   CT Head Wo Contrast  Result Date: 01/07/2023 CLINICAL DATA:  Increased aphasia EXAM: CT HEAD WITHOUT CONTRAST TECHNIQUE: Contiguous axial images were obtained from the base of the skull through the vertex without intravenous contrast. RADIATION DOSE REDUCTION: This exam was performed according to the departmental dose-optimization program which includes automated exposure control, adjustment of the mA and/or kV according to patient size and/or use of iterative reconstruction technique. COMPARISON:  01/04/2023 FINDINGS: Brain: No evidence of acute infarction, hemorrhage, mass, mass effect, or midline shift. No hydrocephalus or extra-axial fluid collection. Mildly advanced cerebral atrophy for age. Periventricular white matter changes, likely the sequela of chronic small vessel ischemic disease. Vascular: No hyperdense vessel. Atherosclerotic calcifications in the intracranial carotid and vertebral arteries. Skull: Negative for fracture or focal lesion. Sinuses/Orbits: Mucous retention cysts in the maxillary sinuses. Status post bilateral lens replacements. Other: The mastoid air cells are well aerated. IMPRESSION: No acute intracranial process. Electronically Signed   By: Wiliam Ke M.D.   On: 01/07/2023 18:48      Feliz Herard T. Blayne Garlick [facility]  If 7PM-7AM, please contact night-coverage [email or web address] 01/08/2023, 11:25 AM

## 2023-01-08 NOTE — Assessment & Plan Note (Signed)
Order bowel regimen and check KUB

## 2023-01-08 NOTE — TOC Initial Note (Signed)
Transition of Care [facility]) - Initial/Assessment Note    Patient Details  Name: Fernando Hess MRN: [id number] Date of Birth: 11-10-1945  [facility] of [facility]) CM/SW Contact:    Deatra Robinson, [name] Phone Number: 01/08/2023, 4:09 PM  Clinical Narrative:  Pt admitted from [facility]. Per Marchelle Folks at [facility], pt is a STR resident and able to return pending auth. Spoke pt's wife who is requesting a SNF closer to her in [place] but agreeable for pt to return to [facility] if needed. Pt's wife is requesting a 5 star facility. Pt will need new auth for return to [facility] or new SNF placement. PT/OT evals pending.   Dellie Burns, MSW, LCSW [phone] (coverage)     Expected Discharge Plan: Skilled Nursing Facility     Patient Goals and CMS Choice     Choice offered to / list presented to : Spouse Hilton ownership interest in [facility].provided to:: Spouse    Expected Discharge Plan and Services                                              Prior Living Arrangements/Services     Patient language and need for interpreter reviewed:: No        Need for Family Participation in Patient Care: No (Comment) Care giver support system in place?: No (comment)   Criminal Activity/Legal Involvement Pertinent to Current Situation/Hospitalization: No - Comment as needed  Activities of Daily Living      Permission Sought/Granted Permission sought to share information with : Facility Industrial/product designer granted to share information with : Yes, Verbal Permission Granted              Emotional Assessment       Orientation: : Fluctuating Orientation (Suspected and/or reported Sundowners) Alcohol / Substance Use: Not Applicable Psych Involvement: No (comment)  Admission diagnosis:  Transient alteration of awareness [R40.4] Acute encephalopathy [G93.40] Patient Active Problem List   Diagnosis Date Noted   Left  ankle pain 01/08/2023   Hematuria 01/08/2023   Fracture of femoral neck, right, closed 12/24/2022   Depression with anxiety 12/24/2022   Acute metabolic encephalopathy 09/18/2022   Acute encephalopathy 09/17/2022   AKI (acute kidney injury) 09/04/2022   Generalized weakness 08/12/2022   Myasthenia gravis 04/21/2022   Chronic kidney disease, stage 3b 03/21/2022   Posterior vitreous detachment of both eyes 01/23/2022   Constipation 11/13/2021   Diabetes mellitus, type 2 11/13/2021   Severe major depression, single episode, without psychotic features 11/13/2021   Amaurosis fugax 11/13/2021   Amnesia 11/13/2021   Anxiety 11/13/2021   Cardiac arrhythmia 11/13/2021   Hearing loss 11/13/2021   Hypercoagulable state 11/13/2021   Hyperparathyroidism due to renal insufficiency 11/13/2021   Mild aortic stenosis 11/05/2021   Paroxysmal atrial fibrillation 11/05/2021   Nasal septal deviation 08/30/2021   Anticoagulated by anticoagulation treatment 07/16/2021   Syncope 05/10/2021   Gastro-esophageal reflux disease without esophagitis 07/25/2020   Globus pharyngeus 07/25/2020   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 12/21/2019   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   Chronic heart failure with preserved ejection fraction (HFpEF) 11/18/2019   OSA on CPAP 03/07/2019   Peripheral artery disease    Intractable vascular headache 11/11/2018   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 [name] [address] Phone:  [phone] Fax: [phone]     Social Determinants of Health (SDOH) Social History: SDOH Screenings   Food Insecurity: No Food Insecurity (09/18/2022)  Housing: Low Risk  (09/18/2022)  Transportation Needs: No Transportation Needs (09/18/2022)  Utilities: Not At Risk (09/18/2022)  Depression (PHQ2-9): Low Risk  (05/20/2019)  Tobacco Use: Low Risk  (01/07/2023)   SDOH Interventions:     Readmission Risk Interventions     No data to display

## 2023-01-08 NOTE — ED Notes (Signed)
Pt presented back to ED with fresh skin tear to right forearm

## 2023-01-08 NOTE — ED Notes (Signed)
Pt has a 14 cm incision of right hip. There is an area towards the top of the incision that is dehisced. The incision has some areas of erythema surrounding the incision. Cleaned the incision w/ns and applied a dry dressing. I cleaned the few skin tear on right forearm w/ns and applied a dry dressing

## 2023-01-08 NOTE — Assessment & Plan Note (Signed)
-   Continue home medications 

## 2023-01-08 NOTE — Progress Notes (Signed)
Pt came in from ED, pt is confuse, only alert to self. Admission was not done at this time.

## 2023-01-08 NOTE — Assessment & Plan Note (Signed)
Chronic stable continue Lipitor 80 mg a day 

## 2023-01-08 NOTE — Assessment & Plan Note (Signed)
Past imaging has showed possible renal cyst. Obtain renal US

## 2023-01-08 NOTE — Assessment & Plan Note (Signed)
Noncompliant with CPAP 

## 2023-01-08 NOTE — Assessment & Plan Note (Signed)
Status post repair.  Patient reports right hip pain.  Will obtain imaging it is unclear if patient had a recurrent fall.

## 2023-01-08 NOTE — H&P (Incomplete)
Fernando Hess UXL:[id number] DOB: 03/01/1946 DOA: 01/07/2023     PCP: [name], MD   Outpatient Specialists:   CARDS:  Dr. Meriam Sprague, MD  NEphrology: *  Dr. NEurology *   Dr. Pulmonary *  Dr.  Oncology * Dr. Sandria Manly* Dr.  Deboraha Sprang, [facility]) No care team member to display Urology Dr. *  Patient arrived to ER on 01/07/23 at 1624 Referred by Attending Loetta Rough, MD   Patient coming from:    home Lives alone,   *** With family From facility ***  Chief Complaint: *** Chief Complaint  Patient presents with  . Altered Mental Status    HPI: Fernando Hess is a 77 y.o. male with medical history significant of p.Afib, CAD, CKD 3b, hx of Right hip fracture sp repair, diastolic CHF, DM2, anxiety, d    Presented with  confusion and slurred speech Pt with Myasthenia Gravis had a recent hip fracture now at [facility]  Called his wife today sounding altered   Pt appears confused  No complaints Patient recently admitted for hip fracture on 12 April underwent right hip Hemi plasty by Dr.  Blanchie Dessert   Denies significant ETOH intake *** Does not smoke*** but interested in quitting***  Lab Results  Component Value Date   SARSCOV2NAA NEGATIVE 09/17/2022   SARSCOV2NAA NEGATIVE 09/04/2022   SARSCOV2NAA NEGATIVE 05/10/2021   SARSCOV2NAA NEGATIVE 06/25/2020    Regarding pertinent Chronic problems:    ****Hyperlipidemia - *on statins {statin:315258}  Lipid Panel     Component Value Date/Time   CHOL 118 09/18/2022 0023   CHOL 121 01/22/2022 0815   TRIG 97 09/18/2022 0023   HDL 33 (L) 09/18/2022 0023   HDL 42 01/22/2022 0815   CHOLHDL 3.6 09/18/2022 0023   VLDL 19 09/18/2022 0023   LDLCALC 66 09/18/2022 0023   LDLCALC 57 01/22/2022 0815   LABVLDL 22 01/22/2022 0815    ***HTN on   ***chronic CHF diastolic/systolic/ combined - last echo*** Recent Results (from the past 53664 hour(s))  ECHOCARDIOGRAM COMPLETE   Collection Time: 12/30/22 11:19 AM  Result Value    Weight 2,433.88   Height 66   BP 126/79   S' Lateral 2.80   AR max vel 1.44   AV Area VTI 1.40   AV Mean grad 11.0   AV Peak grad 20.3   Ao pk vel 2.25   Area-P 1/2 4.41   AV Area mean vel 1.35   Est EF 60 - 65%   Narrative      ECHOCARDIOGRAM REPORT       Patient Name:   Fernando Hess Stokke Date of Exam: 12/30/2022 Medical Rec #:  [id number]      Height:       66.0 in Accession #:    [id number]     Weight:       152.1 lb Date of Birth:  10-13-1945      BSA:          1.780 m Patient Age:    76 years       BP:           142/94 mmHg Patient Gender: M              HR:           102 bpm. Exam Location:  Inpatient  Procedure: 2D Echo, Cardiac Doppler, Color Doppler and Intracardiac            Opacification Agent  Indications:    Dyspnea R06.00   History:        Patient has prior history of Echocardiogram examinations, most                 recent 05/10/2021. CAD, Prior CABG, Arrythmias:Atrial                 Fibrillation, Signs/Symptoms:Syncope; Risk Factors:Hypertension,                 Sleep Apnea, Diabetes and Dyslipidemia. CKD, stage 3.   Sonographer:    Lucendia Herrlich Referring Phys: [id number] KINGSLEY P [place]  IMPRESSIONS    1. Left ventricular ejection fraction, by estimation, is 60 to 65%. The left ventricle has normal function. The left ventricle has no regional wall motion abnormalities. There is moderate concentric left ventricular hypertrophy. Left ventricular  diastolic parameters are consistent with Grade I diastolic dysfunction (impaired relaxation).  2. Right ventricular systolic function is normal. The right ventricular size is normal.  3. The mitral valve is degenerative. Mild mitral valve regurgitation. No evidence of mitral stenosis.  4. Decreased LV stroke volume index. The aortic valve is tricuspid. There is moderate calcification of the aortic valve. Aortic valve regurgitation is mild. Mild aortic valve stenosis. Aortic valve mean gradient measures 11.0 mmHg.   5. The inferior vena cava is normal in size with greater than 50% respiratory variability, suggesting right atrial pressure of 3 mmHg.  Comparison(s): No significant change from prior study. Prior images reviewed side by side.  FINDINGS  Left Ventricle: Left ventricular ejection fraction, by estimation, is 60 to 65%. The left ventricle has normal function. The left ventricle has no regional wall motion abnormalities. Definity contrast agent was given IV to delineate the left ventricular  endocardial borders. The left ventricular internal cavity size was normal in size. There is moderate concentric left ventricular hypertrophy. Left ventricular diastolic parameters are consistent with Grade I diastolic dysfunction (impaired relaxation).  Right Ventricle: The right ventricular size is normal. No increase in right ventricular wall thickness. Right ventricular systolic function is normal.  Left Atrium: Left atrial size was normal in size.  Right Atrium: Right atrial size was normal in size.  Pericardium: There is no evidence of pericardial effusion.  Mitral Valve: The mitral valve is degenerative in appearance. Mild mitral valve regurgitation. No evidence of mitral valve stenosis.  Tricuspid Valve: The tricuspid valve is normal in structure. Tricuspid valve regurgitation is not demonstrated. No evidence of tricuspid stenosis.  Aortic Valve: Decreased LV stroke volume index. The aortic valve is tricuspid. There is moderate calcification of the aortic valve. There is moderate aortic valve annular calcification. Aortic valve regurgitation is mild. Mild aortic stenosis is present.  Aortic valve mean gradient measures 11.0 mmHg. Aortic valve peak gradient measures 20.2 mmHg. Aortic valve area, by VTI measures 1.40 cm.  Pulmonic Valve: The pulmonic valve was normal in structure. Pulmonic valve regurgitation is not visualized. No evidence of pulmonic stenosis.  Aorta: The aortic root and ascending  aorta are structurally normal, with no evidence of dilitation.  Venous: The inferior vena cava is normal in size with greater than 50% respiratory variability, suggesting right atrial pressure of 3 mmHg.  IAS/Shunts: The atrial septum is grossly normal.    LEFT VENTRICLE PLAX 2D LVIDd:         4.00 cm   Diastology LVIDs:         2.80 cm   LV e' medial:    5.59 cm/s LV PW:  1.40 cm   LV E/e' medial:  12.7 LV IVS:        1.40 cm   LV e' lateral:   10.10 cm/s LVOT diam:     1.90 cm   LV E/e' lateral: 7.1 LV SV:         48 LV SV Index:   27 LVOT Area:     2.84 cm    IVC IVC diam: 1.20 cm  LEFT ATRIUM             Index        RIGHT ATRIUM           Index LA diam:        5.00 cm 2.81 cm/m   RA Area:     12.20 cm LA Vol (A2C):   44.4 ml 24.94 ml/m  RA Volume:   21.60 ml  12.13 ml/m LA Vol (A4C):   36.5 ml 20.50 ml/m LA Biplane Vol: 41.1 ml 23.09 ml/m  AORTIC VALVE AV Area (Vmax):    1.44 cm AV Area (Vmean):   1.35 cm AV Area (VTI):     1.40 cm AV Vmax:           225.00 cm/s AV Vmean:          152.000 cm/s AV VTI:            0.342 m AV Peak Grad:      20.2 mmHg AV Mean Grad:      11.0 mmHg LVOT Vmax:         114.67 cm/s LVOT Vmean:        72.433 cm/s LVOT VTI:          0.169 m LVOT/AV VTI ratio: 0.49   AORTA Ao Root diam: 3.30 cm Ao Asc diam:  3.80 cm  MITRAL VALVE               TRICUSPID VALVE MV Area (PHT): 4.41 cm    TR Peak grad:   8.8 mmHg MV Decel Time: 172 msec    TR Vmax:        148.00 cm/s MV E velocity: 71.25 cm/s MV A velocity: 97.70 cm/s  SHUNTS MV E/A ratio:  0.73        Systemic VTI:  0.17 m                            Systemic Diam: 1.90 cm  Riley Lam MD Electronically signed by Riley Lam MD Signature Date/Time: 12/30/2022/12:31:30 PM       Final     *Note: Due to a large number of results and/or encounters for the requested time period, some results have not been displayed. A complete set of results can be  found in Results Review.    *** CAD  - On Aspirin, statin, betablocker, Plavix                 - *followed by cardiology                - last cardiac cath       ***DM 2 -  Lab Results  Component Value Date   HGBA1C 10.7 (H) 09/18/2022   ****on insulin, PO meds only, diet controlled  ***Hypothyroidism:  Lab Results  Component Value Date   TSH 0.580 09/17/2022   on synthroid  *** Morbid obesity-   BMI Readings from Last 1 Encounters:  01/07/23 24.53 kg/m     ***  Asthma -well *** controlled on home inhalers/ nebs                         ***last no prior***admission  ***                       No ***history of intubation  *** COPD - not **followed by pulmonology *** not  on baseline oxygen  *L,    *** OSA -on nocturnal oxygen, *CPAP, *noncompliant with CPAP  *** Hx of CVA - *with/out residual deficits on Aspirin 81 mg, 325, Plavix  ***A. Fib -  - CHA2DS2 vas score **** CHA2DS2/VAS Stroke Risk Points  Current as of 13 minutes ago     8 >= 2 Points: High Risk  1 - 1.99 Points: Medium Risk  0 Points: Low Risk    Last Change: N/A      Details    This score determines the patient's risk of having a stroke if the  patient has atrial fibrillation.       Points Metrics  1 Has Congestive Heart Failure:  Yes    Current as of 13 minutes ago  1 Has Vascular Disease:  Yes    Current as of 13 minutes ago  1 Has Hypertension:  Yes    Current as of 13 minutes ago  2 Age:  8    Current as of 13 minutes ago  1 Has Diabetes:  Yes    Current as of 13 minutes ago  2 Had Stroke:  Yes  Had TIA:  Yes  Had Thromboembolism:  No    Current as of 13 minutes ago  0 Male:  No    Current as of 13 minutes ago           current  on anticoagulation with ****Coumadin  ***Xarelto,* Eliquis,  *** Not on anticoagulation secondary to Risk of Falls, *** recurrent bleeding         -  Rate control:  Currently controlled with ***Toprolol,  *Metoprolol,* Diltiazem, *Coreg          - Rhythm  control: *** amiodarone, *flecainide  ***Hx of DVT/PE on - anticoagulation with ****Coumadin  ***Xarelto,* Eliquis,      CKD stage IIIb- baseline Cr 2.0 Estimated Creatinine Clearance: 26.2 mL/min (A) (by C-G formula based on SCr of 2.1 mg/dL (H)).  Lab Results  Component Value Date   CREATININE 2.10 (H) 01/07/2023   CREATININE 2.07 (H) 01/04/2023   CREATININE 1.91 (H) 01/01/2023    Chronic anemia - baseline hg Hemoglobin & Hematocrit  Recent Labs    01/01/23 0316 01/04/23 0625 01/07/23 1638  HGB 11.7* 11.2* 11.0*   Iron/TIBC/Ferritin/ %Sat    Component Value Date/Time   IRON 7 (L) 12/29/2022 0203   TIBC 231 (L) 12/29/2022 0203   FERRITIN 21 (L) 10/04/2009 1443   IRONPCTSAT 3 (L) 12/29/2022 0203       While in ER: Clinical Course as of 01/07/23 2332  Wed Jan 07, 2023  1658 Glucose-Capillary(!): 257 [HN]  1749 Creatinine(!): 2.10 BL ~2 [HN]  1749 WBC: 10.0 No leukocytosis [HN]  1749 Hemoglobin(!): 11.0 C/w priors [HN]  1749 Troponin I (High Sensitivity): 14 [HN]  1749 Magnesium: 2.0 [HN]  1859 CT Head Wo Contrast No acute intracranial process. [HN]    Clinical Course User Index [HN] Loetta Rough, MD         Lab Orders  Urine Culture         CBC with Differential         Comprehensive metabolic panel         Ethanol         Rapid urine drug screen (hospital performed)         Urinalysis, Routine w reflex microscopic -Urine, Clean Catch         Magnesium         POC CBG, ED      CT HEAD   NON acute    CXR - ***NON acute  CTabd/pelvis - ***nonacute  CTA chest - ***nonacute, no PE, * no evidence of infiltrate  Following Medications were ordered in ER: Medications  QUEtiapine (SEROQUEL) tablet 50 mg (has no administration in time range)    _______________________________________________________ ER Provider Called:     DrMarland Kitchen  They Recommend admit to medicine *** Will see in AM  ***SEEN in ER   ED Triage Vitals  Enc Vitals Group      BP 01/07/23 1628 103/74     Pulse Rate 01/07/23 1628 95     Resp 01/07/23 1628 16     Temp 01/07/23 1628 98.9 F (37.2 C)     Temp Source 01/07/23 1628 Oral     SpO2 01/07/23 1628 98 %     Weight 01/07/23 1629 152 lb (68.9 kg)     Height 01/07/23 1629 5\' 6"  (1.676 m)     Head Circumference --      Peak Flow --      Pain Score 01/07/23 1629 0     Pain Loc --      Pain Edu? --      Excl. in GC? --   TMAX(24)@     _________________________________________ Significant initial  Findings: Abnormal Labs Reviewed  CBC WITH DIFFERENTIAL/PLATELET - Abnormal; Notable for the following components:      Result Value   RBC 3.79 (*)    Hemoglobin 11.0 (*)    HCT 34.2 (*)    Monocytes Absolute 1.1 (*)    Abs Immature Granulocytes 0.18 (*)    All other components within normal limits  COMPREHENSIVE METABOLIC PANEL - Abnormal; Notable for the following components:   Glucose, Bld 286 (*)    BUN 26 (*)    Creatinine, Ser 2.10 (*)    Calcium 8.6 (*)    Total Protein 5.2 (*)    Albumin 2.4 (*)    Alkaline Phosphatase 128 (*)    GFR, Estimated 32 (*)    All other components within normal limits  URINALYSIS, ROUTINE W REFLEX MICROSCOPIC - Abnormal; Notable for the following components:   Color, Urine AMBER (*)    APPearance HAZY (*)    Hgb urine dipstick LARGE (*)    Protein, ur 100 (*)    Leukocytes,Ua SMALL (*)    Bacteria, UA RARE (*)    All other components within normal limits  CBG MONITORING, ED - Abnormal; Notable for the following components:   Glucose-Capillary 257 (*)    All other components within normal limits    _________________________ Troponin   Cardiac Panel (last 3 results) Recent Labs    01/07/23 1638 01/07/23 2002  TROPONINIHS 14 14     ECG: Ordered Personally reviewed and interpreted by me showing: HR : *** Rhythm: *NSR, Sinus tachycardia * A.fib. W RVR, RBBB, LBBB, Paced Ischemic changes*nonspecific changes, no evidence of ischemic changes QTC*  BNP  (last 3 results) Recent  Labs    09/04/22 1500 12/29/22 0203  BNP 188.2* 374.0*     COVID-19 Labs  No results for input(s): "DDIMER", "FERRITIN", "LDH", "CRP" in the last 72 hours.  Lab Results  Component Value Date   SARSCOV2NAA NEGATIVE 09/17/2022   SARSCOV2NAA NEGATIVE 09/04/2022   SARSCOV2NAA NEGATIVE 05/10/2021   SARSCOV2NAA NEGATIVE 06/25/2020    ____________________ This patient meets SIRS Criteria and may be septic.    The recent clinical data is shown below. Vitals:   01/07/23 2230 01/07/23 2245 01/07/23 2300 01/07/23 2315  BP: 123/82 139/78 (!) 126/93 (!) 136/57  Pulse: 92 98 98 98  Resp: (!) 24 (!) 30 (!) 24 (!) 23  Temp:      TempSrc:      SpO2: 98% 97% 97% 98%  Weight:      Height:        WBC     Component Value Date/Time   WBC 10.0 01/07/2023 1638   LYMPHSABS 1.6 01/07/2023 1638   MONOABS 1.1 (H) 01/07/2023 1638   EOSABS 0.3 01/07/2023 1638   BASOSABS 0.1 01/07/2023 1638    Procalcitonin *** Ordered      UA   no evidence of UTI    Urine analysis:    Component Value Date/Time   COLORURINE AMBER (A) 01/07/2023 2203   APPEARANCEUR HAZY (A) 01/07/2023 2203   LABSPEC 1.018 01/07/2023 2203   PHURINE 5.0 01/07/2023 2203   GLUCOSEU NEGATIVE 01/07/2023 2203   HGBUR LARGE (A) 01/07/2023 2203   BILIRUBINUR NEGATIVE 01/07/2023 2203   KETONESUR NEGATIVE 01/07/2023 2203   PROTEINUR 100 (A) 01/07/2023 2203   UROBILINOGEN 0.2 10/02/2009 1604   NITRITE NEGATIVE 01/07/2023 2203   LEUKOCYTESUR SMALL (A) 01/07/2023 2203    Results for orders placed or performed during the hospital encounter of 12/24/22  Surgical PCR screen     Status: None   Collection Time: 12/26/22 11:02 AM   Specimen: Nasal Mucosa; Nasal Swab  Result Value Ref Range Status   MRSA, PCR NEGATIVE NEGATIVE Final   Staphylococcus aureus NEGATIVE NEGATIVE Final    Comment: (NOTE) The Xpert SA Assay (FDA approved for NASAL specimens in patients 48 years of age and older), is one  component of a comprehensive surveillance program. It is not intended to diagnose infection nor to guide or monitor treatment. Performed at [facility], [address], [place], [name] [address]     ABX started Antibiotics Given (last 72 hours)     None       No results found for the last 90 days.     ________________________________________________________________  Arterial ***Venous  Blood Gas result:  pH *** pCO2 ***; pO2 ***;     %O2 Sat ***.  ABG    Component Value Date/Time   PHART 7.434 10/03/2015 0746   PCO2ART 36.4 10/03/2015 0746   PO2ART 72.0 (L) 10/03/2015 0746   HCO3 24.8 06/06/2021 0956   HCO3 26.1 06/06/2021 0956   TCO2 28 10/12/2022 0143   ACIDBASEDEF 1.0 06/06/2021 0956   O2SAT 71.0 06/06/2021 0956   O2SAT 67.0 06/06/2021 0956       __________________________________________________________ Recent Labs  Lab 01/01/23 0316 01/04/23 0625 01/07/23 1638  NA 136 138 139  K 3.4* 4.4 5.0  CO2 20* 25 26  GLUCOSE 135* 177* 286*  BUN 33* 28* 26*  CREATININE 1.91* 2.07* 2.10*  CALCIUM 8.0* 8.3* 8.6*  MG  --   --  2.0    Cr  * stable,  Up  from baseline see below Lab Results  Component Value Date   CREATININE 2.10 (H) 01/07/2023   CREATININE 2.07 (H) 01/04/2023   CREATININE 1.91 (H) 01/01/2023    Recent Labs  Lab 01/04/23 0625 01/07/23 1638  AST 31 39  ALT 33 31  ALKPHOS 107 128*  BILITOT 0.9 0.9  PROT 5.4* 5.2*  ALBUMIN 2.4* 2.4*   Lab Results  Component Value Date   CALCIUM 8.6 (L) 01/07/2023          Plt: Lab Results  Component Value Date   PLT 399 01/07/2023         Recent Labs  Lab 01/01/23 0316 01/04/23 0625 01/07/23 1638  WBC 10.7* 12.5* 10.0  NEUTROABS 6.7 8.1* 6.8  HGB 11.7* 11.2* 11.0*  HCT 35.7* 35.9* 34.2*  MCV 87.9 92.1 90.2  PLT 226 318 399    HG/HCT * stable,  Down *Up from baseline see below    Component Value Date/Time   HGB 11.0 (L) 01/07/2023 1638   HGB 15.8 05/31/2021 1438   HCT  34.2 (L) 01/07/2023 1638   HCT 45.2 05/31/2021 1438   MCV 90.2 01/07/2023 1638   MCV 87 05/31/2021 1438      No results for input(s): "LIPASE", "AMYLASE" in the last 168 hours. No results for input(s): "AMMONIA" in the last 168 hours.    .lab  _______________________________________________ Hospitalist was called for admission for *** Transient alteration of awareness ***    The following Work up has been ordered so far:  Orders Placed This Encounter  Procedures  . Urine Culture  . CT Head Wo Contrast  . CBC with Differential  . Comprehensive metabolic panel  . Ethanol  . Rapid urine drug screen (hospital performed)  . Urinalysis, Routine w reflex microscopic -Urine, Clean Catch  . Magnesium  . In and Out Cath  . Consult to hospitalist  . POC CBG, ED  . ED EKG     OTHER Significant initial  Findings:  labs showing:     DM  labs:  HbA1C: Recent Labs    08/13/22 1729 09/18/22 0023  HGBA1C 14.6* 10.7*       CBG (last 3)  Recent Labs    01/07/23 1649  GLUCAP 257*          Cultures:    Component Value Date/Time   SDES STOOL 03/02/2008 1212   SDES STOOL 03/02/2008 1212   SDES STOOL 03/02/2008 1212   SPECREQUEST IMMUNE:NORM 03/02/2008 1212   SPECREQUEST NONE 03/02/2008 1212   SPECREQUEST IMMUNE:NORM 03/02/2008 1212   CULT  03/02/2008 1212    NO SALMONELLA, SHIGELLA, CAMPYLOBACTER, OR YERSINIA ISOLATED   REPTSTATUS 03/06/2008 FINAL 03/02/2008 1212   REPTSTATUS 03/03/2008 FINAL 03/02/2008 1212   REPTSTATUS 03/04/2008 FINAL 03/02/2008 1212     Radiological Exams on Admission: CT Head Wo Contrast  Result Date: 01/07/2023 CLINICAL DATA:  Increased aphasia EXAM: CT HEAD WITHOUT CONTRAST TECHNIQUE: Contiguous axial images were obtained from the base of the skull through the vertex without intravenous contrast. RADIATION DOSE REDUCTION: This exam was performed according to the departmental dose-optimization program which includes automated exposure  control, adjustment of the mA and/or kV according to patient size and/or use of iterative reconstruction technique. COMPARISON:  01/04/2023 FINDINGS: Brain: No evidence of acute infarction, hemorrhage, mass, mass effect, or midline shift. No hydrocephalus or extra-axial fluid collection. Mildly advanced cerebral atrophy for age. Periventricular white matter changes, likely the sequela of chronic small vessel ischemic disease. Vascular: No hyperdense vessel. Atherosclerotic  calcifications in the intracranial carotid and vertebral arteries. Skull: Negative for fracture or focal lesion. Sinuses/Orbits: Mucous retention cysts in the maxillary sinuses. Status post bilateral lens replacements. Other: The mastoid air cells are well aerated. IMPRESSION: No acute intracranial process. Electronically Signed   By: Wiliam Ke M.D.   On: 01/07/2023 18:48   _______________________________________________________________________________________________________ Latest  Blood pressure (!) 136/57, pulse 98, temperature 98.9 F (37.2 C), temperature source Oral, resp. rate (!) 23, height 5\' 6"  (1.676 m), weight 68.9 kg, SpO2 98 %.   Vitals  labs and radiology finding personally reviewed  Review of Systems:    Pertinent positives include: ***  Constitutional:  No weight loss, night sweats, Fevers, chills, fatigue, weight loss  HEENT:  No headaches, Difficulty swallowing,Tooth/dental problems,Sore throat,  No sneezing, itching, ear ache, nasal congestion, post nasal drip,  Cardio-vascular:  No chest pain, Orthopnea, PND, anasarca, dizziness, palpitations.no Bilateral lower extremity swelling  GI:  No heartburn, indigestion, abdominal pain, nausea, vomiting, diarrhea, change in bowel habits, loss of appetite, melena, blood in stool, hematemesis Resp:  no shortness of breath at rest. No dyspnea on exertion, No excess mucus, no productive cough, No non-productive cough, No coughing up of blood.No change in color of  mucus.No wheezing. Skin:  no rash or lesions. No jaundice GU:  no dysuria, change in color of urine, no urgency or frequency. No straining to urinate.  No flank pain.  Musculoskeletal:  No joint pain or no joint swelling. No decreased range of motion. No back pain.  Psych:  No change in mood or affect. No depression or anxiety. No memory loss.  Neuro: no localizing neurological complaints, no tingling, no weakness, no double vision, no gait abnormality, no slurred speech, no confusion  All systems reviewed and apart from HOPI all are negative _______________________________________________________________________________________________ Past Medical History:   Past Medical History:  Diagnosis Date  . Acute renal failure (ARF) 09/24/2015  . Acute stress disorder 11/13/2021  . Atrial premature complexes   . CAD (coronary artery disease) of artery bypass graft    Early occlusion of saphenous vein graft to intermediate and marginal branch in February 2007 following bypass grafting   . CAD (coronary artery disease), native coronary artery 2017   hx NSTEMI 09-24-2015  s/p  CABG x5 on 10-02-2015;  post op STEMI inferolateral wall,  SVG OM1 and SVG OM2 occluded, distal OM occlusion the calpruit, treated medically // Myoview 7/21: no ischemia, EF 65, low risk  . CHF (congestive heart failure)   . CKD (chronic kidney disease), stage III   . Contusion of right knee 03/16/2020  . Dyspnea   . Elevated troponin   . Erectile dysfunction   . Esophageal reflux   . History of atrial fibrillation    post op CABG 10-02-2015  . History of kidney stones   . History of non-ST elevation myocardial infarction (NSTEMI) 09/24/2015   s/p  CABG x5  . History of ST elevation myocardial infarction (STEMI) 10/22/2015   inferior wall,  post op CABG 10-02-2015  . Hyperlipidemia   . Hypertension   . Left ureteral stone   . Mild atherosclerosis of both carotid arteries   . Nephrolithiasis    per CT bilateral  non-obstructive calculi  . OSA (obstructive sleep apnea)   . Peripheral artery disease    LE Arterial US 01/2019: R PTA and ATA occluded; L ATA occluded  . RBBB (right bundle branch block)   . Renal atrophy, right   . Sleep apnea  wears cpap   . ST elevation myocardial infarction (STEMI) of inferior wall 10/22/2015  . Type 2 diabetes mellitus treated with insulin    followed by pcp  . 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 Wears glasses       Past Surgical History:  Procedure Laterality Date  . APPENDECTOMY  1965  . CARDIAC CATHETERIZATION N/A 09/26/2015   Procedure: Left Heart Cath and Coronary Angiography;  Surgeon: Lennette Bihari, MD;  Location: [facility] INVASIVE CV LAB;  Service: Cardiovascular;  Laterality: N/A;  . CARDIAC CATHETERIZATION N/A 10/22/2015   Procedure: Left Heart Cath and Coronary Angiography;  Surgeon: Tonny Bollman, MD;  Location: [facility] INVASIVE CV LAB;  Service: Cardiovascular;  Laterality: N/A;  . CATARACT EXTRACTION W/ INTRAOCULAR LENS  IMPLANT, BILATERAL  2017  . COLONOSCOPY    . CORONARY ARTERY BYPASS GRAFT N/A 10/02/2015   Procedure: CORONARY ARTERY BYPASS GRAFTING (CABG) X5 LIMA-LAD; SVG-DIAG; SVG-OM; SVG-PD; SVG-RAMUS TRANSESOPHAGEAL ECHOCARDIOGRAM (TEE) ENDOSCOPIC GREATER SAPHENOUS VEIN  HARVEST BILAT LE;  Surgeon: Kerin Perna, MD;  Location: [facility] OR;  Service: Open Heart Surgery;  Laterality: N/A;  . CYSTOSCOPY/URETEROSCOPY/HOLMIUM LASER/STENT PLACEMENT Left 08/10/2018   Procedure: CYSTOSCOPY/URETEROSCOPY/HOLMIUM LASER/STENT PLACEMENT;  Surgeon: Jerilee Field, MD;  Location: [facility];  Service: Urology;  Laterality: Left;  . CYSTOSCOPY/URETEROSCOPY/HOLMIUM LASER/STENT PLACEMENT Left 09/10/2018   Procedure: CYSTOSCOPY/URETEROSCOPY/HOLMIUM LASER/STENT EXCHANGE;  Surgeon: Jerilee Field, MD;  Location: [facility] ORS;  Service: Urology;  Laterality: Left;  . HIP  ARTHROPLASTY Right 12/26/2022   Procedure: RIGHT HEMI HIP ARTHROPLASTY;  Surgeon: Joen Laura, MD;  Location: [facility] OR;  Service: Orthopedics;  Laterality: Right;  . LEFT HEART CATHETERIZATION WITH CORONARY ANGIOGRAM N/A 04/13/2014   Procedure: LEFT HEART CATHETERIZATION WITH CORONARY ANGIOGRAM;  Surgeon: Othella Boyer, MD;  Location: [facility] CATH LAB;  Service: Cardiovascular;  Laterality: N/A;  . LEG SURGERY Right age 4   closed reduction leg fracture  . NASAL SEPTOPLASTY W/ TURBINOPLASTY Bilateral 08/30/2021   Procedure: NASAL SEPTOPLASTY WITH BILATERAL INFERIOR TURBINATE REDUCTION;  Surgeon: Osborn Coho, MD;  Location: [facility] OR;  Service: ENT;  Laterality: Bilateral;  . POLYPECTOMY    . RIGHT HEART CATH N/A 06/06/2021   Procedure: RIGHT HEART CATH;  Surgeon: Dolores Patty, MD;  Location: [facility] INVASIVE CV LAB;  Service: Cardiovascular;  Laterality: N/A;  . TEE WITHOUT CARDIOVERSION N/A 10/02/2015   Procedure: TRANSESOPHAGEAL ECHOCARDIOGRAM (TEE);  Surgeon: Kerin Perna, MD;  Location: [facility] OR;  Service: Open Heart Surgery;  Laterality: N/A;  . URETEROSCOPY WITH HOLMIUM LASER LITHOTRIPSY Bilateral 2004;  2005  dr grapey  @[facility]   . VASECTOMY      Social History:  Ambulatory *** independently cane, walker  wheelchair bound, bed bound     reports that he has never smoked. He has never used smokeless tobacco. He reports that he does not currently use alcohol. He reports that he does not use drugs.     Family History: *** Family History  Problem Relation Age of Onset  . Heart attack Mother   . Heart attack Father   . Diabetes Brother   . Pancreatic cancer Brother   . Diabetes Brother   . Colon cancer Neg Hx   . Esophageal cancer Neg Hx   . Prostate cancer Neg Hx   . Rectal cancer Neg Hx   . Stomach cancer Neg Hx   . Colon polyps Neg Hx    ______________________________________________________________________________________________ Allergies: Allergies  Allergen  Reactions  . Cilostazol Swelling and Other (See Comments)    Edema   . Dulaglutide Nausea And Vomiting and Other (See Comments)    TRULICITY  . Levofloxacin Hives, Itching and Rash  . Liraglutide Other (See Comments)    Severe fatigue & insomnia  . Lisinopril Itching, Rash and Cough     Prior to Admission medications   Medication Sig Start Date End Date Taking? Authorizing Provider  acetaminophen (TYLENOL) 500 MG tablet Take 2 tablets (1,000 mg total) by mouth every 8 (eight) hours as needed. 12/26/22 01/25/23  Cecil Cobbs, [name]  apixaban (ELIQUIS) 5 MG TABS tablet Take 1 tablet (5 mg total) by mouth 2 (two) times daily. 01/01/23   Lorin Glass, MD  atorvastatin (LIPITOR) 80 MG tablet TAKE ONE TABLET BY MOUTH EVERY EVENING 11/13/22   Weaver, Scott T, [name]  B-D UF III MINI PEN NEEDLES 31G X 5 MM MISC USE AS DIRECTED WITH LANTUS SOLOSTAR 04/02/19   [provider]  budesonide (PULMICORT) 0.25 MG/2ML nebulizer solution Take 2 mLs (0.25 mg total) by nebulization 2 (two) times daily. 01/01/23   Lorin Glass, MD  buPROPion (WELLBUTRIN XL) 150 MG 24 hr tablet Take 150 mg by mouth every morning. 12/10/22   [provider]  Continuous Blood Gluc Sensor (FREESTYLE LIBRE 2 SENSOR) MISC Inject 1 Device into the skin every 14 (fourteen) days.    [provider]  DULoxetine (CYMBALTA) 30 MG capsule Take 3 capsules (90 mg total) by mouth every morning. 01/02/23   Lorin Glass, MD  feeding supplement, GLUCERNA SHAKE, (GLUCERNA SHAKE) LIQD Take 237 mLs by mouth 3 (three) times daily between meals. 01/01/23   Dahal, Melina Schools, MD  insulin aspart (NOVOLOG) 100 UNIT/ML injection Inject 0-15 Units into the skin 3 (three) times daily with meals. 01/01/23   Dahal, Melina Schools, MD  insulin aspart (NOVOLOG) 100 UNIT/ML injection Inject 0-5 Units into the skin at bedtime. 01/01/23   Lorin Glass, MD  Insulin Glargine (BASAGLAR KWIKPEN) 100 UNIT/ML SOPN Inject 40 Units into the skin in the  morning.    [provider]  MAGNESIUM PO Take 350 mg by mouth every evening.    [provider]  Multiple Vitamins-Minerals (SENIOR MULTIVITAMIN PLUS PO) Take 1 tablet by mouth daily with breakfast.    [provider]  nitroGLYCERIN (NITROSTAT) 0.4 MG SL tablet Place 0.4 mg under the tongue every 5 (five) minutes as needed for chest pain.    [provider]  nystatin (MYCOSTATIN/NYSTOP) powder Apply 1 Application topically 2 (two) times daily as needed (skin irritation). Apply to the groin in the morning and evening    [provider]  nystatin cream (MYCOSTATIN) Apply 1 Application topically 2 (two) times daily as needed for dry skin. Apply to the groin in the morning and evening when not using the nystatin powder    [provider]  oxyCODONE (ROXICODONE) 5 MG immediate release tablet Take 1 tablet (5 mg total) by mouth every 4 (four) hours as needed for up to 7 days for severe pain or moderate pain. 01/01/23 01/08/23  Freeman Caldron, [name]  pantoprazole (PROTONIX) 40 MG tablet TAKE 1 TABLET BY MOUTH EVERY DAY Patient taking differently: Take 40 mg by mouth in the morning. 11/25/19   [name], MD  polyethylene glycol (MIRALAX / GLYCOLAX) 17 g packet Take 17 g by mouth daily as needed for mild constipation. Patient not taking: Reported on 12/24/2022 09/22/22   Osvaldo Shipper, MD  QUEtiapine (SEROQUEL) 50 MG tablet Take 1 tablet (50 mg total) by mouth 2 (two) times daily. Patient taking differently: Take 50 mg by mouth daily. 09/23/22   Osvaldo Shipper, MD  senna-docusate (SENOKOT-S) 8.6-50 MG tablet Take 1 tablet by mouth at bedtime as needed for mild constipation. 01/01/23   Lorin Glass, MD  tamsulosin (FLOMAX) 0.4 MG CAPS capsule Take 1 capsule (0.4 mg total) by mouth daily after breakfast. 01/02/23   Lorin Glass, MD    ___________________________________________________________________________________________________ Physical Exam:     01/07/2023   11:15 PM 01/07/2023   11:00 PM 01/07/2023   10:45 PM  Vitals with BMI  Systolic 136 126 161  Diastolic 57 93 78  Pulse 98 98 98     1. General:  in No ***Acute distress***increased work of breathing ***complaining of severe pain****agitated * Chronically ill *well *cachectic *toxic acutely ill -appearing 2. Psychological: Alert and *** Oriented 3. Head/ENT:   Moist *** Dry Mucous Membranes                          Head Non traumatic, neck supple                          Normal *** Poor Dentition 4. SKIN: normal *** decreased Skin turgor,  Skin clean Dry and intact no rash    5. Heart: Regular rate and rhythm no*** Murmur, no Rub or gallop 6. Lungs: ***Clear to auscultation bilaterally, no wheezes or crackles   7. Abdomen: Soft, ***non-tender, Non distended *** obese ***bowel sounds present 8. Lower extremities: no clubbing, cyanosis, no ***edema 9. Neurologically Grossly intact, moving all 4 extremities equally *** strength 5 out of 5 in all 4 extremities cranial nerves II through XII intact 10. MSK: Normal range of motion    Chart has been reviewed  ______________________________________________________________________________________________  Assessment/Plan  ***  Admitted for *** Transient alteration of awareness ***    Present on Admission: **None**     No problem-specific Assessment & Plan notes found for this encounter.    Other plan as per orders.  DVT prophylaxis:  SCD *** Lovenox       Code Status:    Code Status: Prior FULL CODE *** DNR/DNI ***comfort care as per patient ***family  I had personally discussed CODE STATUS with patient and family* I had spent *min discussing goals of care and CODE STATUS ACP has been reviewed ***   Family Communication:   Family not at  Bedside  plan of care was discussed on the phone with *** Son, Daughter, Wife, Husband, Sister, Brother , father, mother  Diet    Disposition Plan:   *** likely will  need placement for rehabilitation                          Back to current facility when stable                            To home once workup is complete and patient is stable  ***Following barriers for discharge:                            Electrolytes corrected  Anemia corrected                             Pain controlled with PO medications                               Afebrile, white count improving able to transition to PO antibiotics                             Will need to be able to tolerate PO                            Will likely need home health, home O2, set up                           Will need consultants to evaluate patient prior to discharge  ****EXPECT DC tomorrow       Consult Orders  (From admission, onward)           Start     Ordered   01/07/23 2311  Consult to hospitalist  Once       Provider:  (Not yet assigned)  Question Answer Comment  Place call to: [facility]   Reason for Consult Admit      01/07/23 2310                              ***Would benefit from PT/OT eval prior to DC  Ordered                   Swallow eval - SLP ordered                   Diabetes care coordinator                   Transition of care consulted                   Nutrition    consulted                  Wound care  consulted                   Palliative care    consulted                   Behavioral health  consulted                    Consults called: ***     Admission status:  ED Disposition     ED Disposition  Admit   Condition  --   Comment  The patient appears reasonably stabilized for admission considering the current resources, flow, and capabilities available in the ED at this time, and I doubt any other [facility] requiring further screening and/or treatment in the ED prior to admission is  present.           Obs***  ***  inpatient     I Expect 2 midnight stay secondary to severity of patient's current  illness need for inpatient interventions justified by the following: ***hemodynamic instability despite optimal treatment (tachycardia *hypotension * tachypnea *hypoxia, hypercapnia) * Severe lab/radiological/exam abnormalities including:  and extensive comorbidities including: *substance abuse  *Chronic pain *DM2  * CHF * CAD  * COPD/asthma *Morbid Obesity * CKD *dementia *liver disease *history of stroke with residual deficits *  malignancy, * sickle cell disease  History of amputation Chronic anticoagulation  That are currently affecting medical management.   I expect  patient to be hospitalized for 2 midnights requiring inpatient medical care.  Patient is at high risk for adverse outcome (such as loss of life or disability) if not treated.  Indication for inpatient stay as follows:  Severe change from baseline regarding mental status Hemodynamic instability despite maximal medical therapy,  ongoing suicidal ideations,  severe pain requiring acute inpatient management,  inability to maintain oral hydration   persistent chest pain despite medical management Need for operative/procedural  intervention New or worsening hypoxia   Need for IV antibiotics, IV fluids, IV rate controling medications, IV antihypertensives, IV pain medications, IV anticoagulation, need for biPAP    Level of care   *** tele  For 12H 24H     medical floor       progressive tele indefinitely please discontinue once patient no longer qualifies COVID-19 Labs    Lab Results  Component Value Date   SARSCOV2NAA NEGATIVE 09/17/2022     Precautions: admitted as *** Covid Negative  ***asymptomatic screening protocol****PUI *** covid positive No active isolations ***If Covid PCR is negative  - please DC precautions - would need additional investigation given very high risk for false native test result    Critical***  Patient is critically ill due to  hemodynamic instability * respiratory  failure *severe sepsis* ongoing chest pain*  They are at high risk for life/limb threatening clinical deterioration requiring frequent reassessment and modifications of care.  Services provided include examination of the patient, review of relevant ancillary tests, prescription of lifesaving therapies, review of medications and prophylactic therapy.  Total critical care time excluding separately billable procedures: 60*  Minutes.    Princeton Nabor 01/07/2023, 11:32 PM ***  [facility]     after 2 AM please page floor coverage PA If 7AM-7PM, please contact the day team taking care of the patient using [email or web address]

## 2023-01-08 NOTE — ED Notes (Signed)
Epic Report given to Janece Canterbury, RN of [employer] [phone]

## 2023-01-08 NOTE — ED Notes (Signed)
ED TO INPATIENT HANDOFF REPORT  ED Nurse Name and Phone #: [phone]  S Name/Age/Gender Fernando Hess 77 y.o. male Room/Bed: 006C/006C  Code Status   Code Status: Full Code  Home/SNF/Other Skilled nursing facility Patient oriented to: self Is this baseline? No   Triage Complete: Triage complete  Chief Complaint Acute encephalopathy [G93.40]  Triage Note Per [facility] EMS pt coming from [facility]. Patients wife was speaking on the phone with patient and states patient was having increased aphasia from baseline. States patient recently diagnosed with UTI.   Ems vitals 130/70 HR 80-110 irregular 97% 2L [place] baseline CBG 247    Allergies Allergies  Allergen Reactions   Cilostazol Swelling and Other (See Comments)    Edema    Dulaglutide Nausea And Vomiting and Other (See Comments)    TRULICITY   Levofloxacin Hives, Itching and Rash   Liraglutide Other (See Comments)    Severe fatigue & insomnia   Lisinopril Itching, Rash and Cough    Level of Care/Admitting Diagnosis ED Disposition     ED Disposition  Admit   Condition  --   Comment  Hospital Area: [facility] [[id number]]  Level of Care: Telemetry Medical [104]  May place patient in observation at [facility] or [facility] if equivalent level of care is available:: No  Covid Evaluation: Asymptomatic - no recent exposure (last 10 days) testing not required  Diagnosis: Acute encephalopathy [454098]  Admitting Physician: Therisa Doyne [[id number]]  Attending Physician: Therisa Doyne [[id number]]          B Medical/Surgery History Past Medical History:  Diagnosis Date   Acute renal failure (ARF) 09/24/2015   Acute stress disorder 11/13/2021   Atrial premature complexes    CAD (coronary artery disease) of artery bypass graft    Early occlusion of saphenous vein graft to intermediate and marginal branch in February 2007 following bypass grafting    CAD (coronary  artery disease), native coronary artery 2017   hx NSTEMI 09-24-2015  s/p  CABG x5 on 10-02-2015;  post op STEMI inferolateral wall,  SVG OM1 and SVG OM2 occluded, distal OM occlusion the calpruit, treated medically // Myoview 7/21: no ischemia, EF 65, low risk   CHF (congestive heart failure)    CKD (chronic kidney disease), stage III    Contusion of right knee 03/16/2020   Dyspnea    Elevated troponin    Erectile dysfunction    Esophageal reflux    History of atrial fibrillation    post op CABG 10-02-2015   History of kidney stones    History of non-ST elevation myocardial infarction (NSTEMI) 09/24/2015   s/p  CABG x5   History of ST elevation myocardial infarction (STEMI) 10/22/2015   inferior wall,  post op CABG 10-02-2015   Hyperlipidemia    Hypertension    Left ureteral stone    Mild atherosclerosis of both carotid arteries    Nephrolithiasis    per CT bilateral non-obstructive calculi   OSA (obstructive sleep apnea)    Peripheral artery disease    LE Arterial US 01/2019: R PTA and ATA occluded; L ATA occluded   RBBB (right bundle branch block)    Renal atrophy, right    Sleep apnea    wears cpap    ST elevation myocardial infarction (STEMI) of inferior wall 10/22/2015   Type 2 diabetes mellitus treated with insulin    followed by pcp   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 03/01/2008   Wears glasses    Past Surgical History:  Procedure Laterality Date   APPENDECTOMY  1965   CARDIAC CATHETERIZATION N/A 09/26/2015   Procedure: Left Heart Cath and Coronary Angiography;  Surgeon: Lennette Bihari, MD;  Location: [facility] INVASIVE CV LAB;  Service: Cardiovascular;  Laterality: N/A;   CARDIAC CATHETERIZATION N/A 10/22/2015   Procedure: Left Heart Cath and Coronary Angiography;  Surgeon: Tonny Bollman, MD;  Location: [facility] INVASIVE CV LAB;  Service: Cardiovascular;  Laterality: N/A;   CATARACT EXTRACTION W/  INTRAOCULAR LENS  IMPLANT, BILATERAL  2017   COLONOSCOPY     CORONARY ARTERY BYPASS GRAFT N/A 10/02/2015   Procedure: CORONARY ARTERY BYPASS GRAFTING (CABG) X5 LIMA-LAD; SVG-DIAG; SVG-OM; SVG-PD; SVG-RAMUS TRANSESOPHAGEAL ECHOCARDIOGRAM (TEE) ENDOSCOPIC GREATER SAPHENOUS VEIN  HARVEST BILAT LE;  Surgeon: Kerin Perna, MD;  Location: [facility] OR;  Service: Open Heart Surgery;  Laterality: N/A;   CYSTOSCOPY/URETEROSCOPY/HOLMIUM LASER/STENT PLACEMENT Left 08/10/2018   Procedure: CYSTOSCOPY/URETEROSCOPY/HOLMIUM LASER/STENT PLACEMENT;  Surgeon: Jerilee Field, MD;  Location: [facility];  Service: Urology;  Laterality: Left;   CYSTOSCOPY/URETEROSCOPY/HOLMIUM LASER/STENT PLACEMENT Left 09/10/2018   Procedure: CYSTOSCOPY/URETEROSCOPY/HOLMIUM LASER/STENT EXCHANGE;  Surgeon: Jerilee Field, MD;  Location: [facility] ORS;  Service: Urology;  Laterality: Left;   HIP ARTHROPLASTY Right 12/26/2022   Procedure: RIGHT HEMI HIP ARTHROPLASTY;  Surgeon: Joen Laura, MD;  Location: [facility] OR;  Service: Orthopedics;  Laterality: Right;   LEFT HEART CATHETERIZATION WITH CORONARY ANGIOGRAM N/A 04/13/2014   Procedure: LEFT HEART CATHETERIZATION WITH CORONARY ANGIOGRAM;  Surgeon: Othella Boyer, MD;  Location: [facility] CATH LAB;  Service: Cardiovascular;  Laterality: N/A;   LEG SURGERY Right age 38   closed reduction leg fracture   NASAL SEPTOPLASTY W/ TURBINOPLASTY Bilateral 08/30/2021   Procedure: NASAL SEPTOPLASTY WITH BILATERAL INFERIOR TURBINATE REDUCTION;  Surgeon: Osborn Coho, MD;  Location: [facility] OR;  Service: ENT;  Laterality: Bilateral;   POLYPECTOMY     RIGHT HEART CATH N/A 06/06/2021   Procedure: RIGHT HEART CATH;  Surgeon: Dolores Patty, MD;  Location: [facility] INVASIVE CV LAB;  Service: Cardiovascular;  Laterality: N/A;   TEE WITHOUT CARDIOVERSION N/A 10/02/2015   Procedure: TRANSESOPHAGEAL ECHOCARDIOGRAM (TEE);  Surgeon: Kerin Perna, MD;  Location[facility] OR;  Service: Open Heart Surgery;   Laterality: N/A;   URETEROSCOPY WITH HOLMIUM LASER LITHOTRIPSY Bilateral 2004;  2005  dr Isabel Caprice     VASECTOMY       A IV Location/Drains/Wounds Patient Lines/Drains/Airways Status     Active Line/Drains/Airways     Name Placement date Placement time Site Days   Peripheral IV 01/07/23 18 G Anterior;Distal;Left;Upper Arm 01/07/23  1643  Arm  1   External Urinary Catheter 01/08/23  0810  --  less than 1   Wound / Incision (Open or Dehisced) 12/31/22 Buttocks Left;Medial Partial thickness loss of dermis presenting as a shallow open ulcer with a red or pink wound bed, without slough or bruising. 12/31/22  2337  Buttocks  8            Intake/Output Last 24 hours  Intake/Output Summary (Last 24 hours) at 01/08/2023 1125 Last data filed at 01/08/2023 1029 Gross per 24 hour  Intake 500.16 ml  Output --  Net 500.16 ml    Labs/Imaging Results for orders placed or performed during the hospital encounter of 01/07/23 (from the past 48 hour(s))  CBC with Differential     Status: Abnormal   Collection Time: 01/07/23  4:38 PM  Result Value Ref Range   WBC 10.0 4.0 - 10.5 K/uL   RBC 3.79 (L) 4.22 - 5.81 MIL/uL   Hemoglobin 11.0 (L) 13.0 - 17.0 g/dL   HCT 16.1 (L) 09.6 - 04.5 %   MCV 90.2 80.0 - 100.0 fL   MCH 29.0 26.0 - 34.0 pg   MCHC 32.2 30.0 - 36.0 g/dL   RDW 40.9 81.1 - 91.4 %   Platelets 399 150 - 400 K/uL   nRBC 0.0 0.0 - 0.2 %   Neutrophils Relative % 67 %   Neutro Abs 6.8 1.7 - 7.7 K/uL   Lymphocytes Relative 16 %   Lymphs Abs 1.6 0.7 - 4.0 K/uL   Monocytes Relative 11 %   Monocytes Absolute 1.1 (H) 0.1 - 1.0 K/uL   Eosinophils Relative 3 %   Eosinophils Absolute 0.3 0.0 - 0.5 K/uL   Basophils Relative 1 %   Basophils Absolute 0.1 0.0 - 0.1 K/uL   Immature Granulocytes 2 %   Abs Immature Granulocytes 0.18 (H) 0.00 - 0.07 K/uL    Comment: Performed at [facility] Lab, [address] [place], [name] [address]  Comprehensive metabolic panel     Status: Abnormal    Collection Time: 01/07/23  4:38 PM  Result Value Ref Range   Sodium 139 135 - 145 mmol/L   Potassium 5.0 3.5 - 5.1 mmol/L   Chloride 103 98 - 111 mmol/L   CO2 26 22 - 32 mmol/L   Glucose, Bld 286 (H) 70 - 99 mg/dL    Comment: Glucose reference range applies only to samples taken after fasting for at least 8 hours.   BUN 26 (H) 8 - 23 mg/dL   Creatinine, Ser 6.21 (H) 0.61 - 1.24 mg/dL   Calcium 8.6 (L) 8.9 - 10.3 mg/dL   Total Protein 5.2 (L) 6.5 - 8.1 g/dL   Albumin 2.4 (L) 3.5 - 5.0 g/dL   AST 39 15 - 41 U/L   ALT 31 0 - 44 U/L   Alkaline Phosphatase 128 (H) 38 - 126 U/L   Total Bilirubin 0.9 0.3 - 1.2 mg/dL   GFR, Estimated 32 (L) >60 mL/min    Comment: (NOTE) Calculated using the CKD-EPI Creatinine Equation (2021)    Anion gap 10 5 - 15    Comment: Performed at [facility] Lab, [address], [place], [name] [address]  Ethanol     Status: None   Collection Time: 01/07/23  4:38 PM  Result Value Ref Range   Alcohol, Ethyl (B) <10 <10 mg/dL    Comment: (NOTE) Lowest detectable limit for serum alcohol is 10 mg/dL.  For medical purposes only. Performed at [facility] Lab, [address], [place], [name] [address]   Magnesium     Status: None   Collection Time: 01/07/23  4:38 PM  Result Value Ref Range   Magnesium 2.0 1.7 - 2.4 mg/dL    Comment: Performed at [facility] Lab, [address] [place], [name] [address]  Troponin I (High Sensitivity)     Status: None   Collection Time: 01/07/23  4:38 PM  Result Value Ref Range   Troponin I (High Sensitivity) 14 <18 ng/L    Comment: (NOTE) Elevated high sensitivity troponin I (hsTnI) values and significant  changes across serial measurements may suggest ACS but many other  chronic and acute conditions are known to elevate hsTnI results.  Refer to the "Links" section for chest pain  algorithms and additional  guidance. Performed at [facility] Lab, [address] [place], [name] [address]   POC CBG, ED      Status: Abnormal   Collection Time: 01/07/23  4:49 PM  Result Value Ref Range   Glucose-Capillary 257 (H) 70 - 99 mg/dL    Comment: Glucose reference range applies only to samples taken after fasting for at least 8 hours.  Troponin I (High Sensitivity)     Status: None   Collection Time: 01/07/23  8:02 PM  Result Value Ref Range   Troponin I (High Sensitivity) 14 <18 ng/L    Comment: (NOTE) Elevated high sensitivity troponin I (hsTnI) values and significant  changes across serial measurements may suggest ACS but many other  chronic and acute conditions are known to elevate hsTnI results.  Refer to the "Links" section for chest pain algorithms and additional  guidance. Performed at [facility] Lab, [address], [place], [name] [address]   Rapid urine drug screen (hospital performed)     Status: None   Collection Time: 01/07/23 10:03 PM  Result Value Ref Range   Opiates NONE DETECTED NONE DETECTED   Cocaine NONE DETECTED NONE DETECTED   Benzodiazepines NONE DETECTED NONE DETECTED   Amphetamines NONE DETECTED NONE DETECTED   Tetrahydrocannabinol NONE DETECTED NONE DETECTED   Barbiturates NONE DETECTED NONE DETECTED    Comment: (NOTE) DRUG SCREEN FOR MEDICAL PURPOSES ONLY.  IF CONFIRMATION IS NEEDED FOR ANY PURPOSE, NOTIFY LAB WITHIN 5 DAYS.  LOWEST DETECTABLE LIMITS FOR URINE DRUG SCREEN Drug Class                     Cutoff (ng/mL) Amphetamine and metabolites    1000 Barbiturate and metabolites    200 Benzodiazepine                 200 Opiates and metabolites        300 Cocaine and metabolites        300 THC                            50 Performed at [facility] Lab, [address], [place], [name] [address]   Urinalysis, Routine w reflex microscopic -Urine, Clean Catch     Status: Abnormal   Collection Time: 01/07/23 10:03 PM  Result Value Ref Range   Color, Urine AMBER (A) YELLOW    Comment: BIOCHEMICALS MAY BE AFFECTED BY COLOR   APPearance HAZY (A)  CLEAR   Specific Gravity, Urine 1.018 1.005 - 1.030   pH 5.0 5.0 - 8.0   Glucose, UA NEGATIVE NEGATIVE mg/dL   Hgb urine dipstick LARGE (A) NEGATIVE   Bilirubin Urine NEGATIVE NEGATIVE   Ketones, ur NEGATIVE NEGATIVE mg/dL   Protein, ur 914 (A) NEGATIVE mg/dL   Nitrite NEGATIVE NEGATIVE   Leukocytes,Ua SMALL (A) NEGATIVE   RBC / HPF >50 0 - 5 RBC/hpf   WBC, UA 11-20 0 - 5 WBC/hpf   Bacteria, UA RARE (A) NONE SEEN   Squamous Epithelial / HPF 0-5 0 - 5 /HPF   Mucus PRESENT    Hyaline Casts, UA PRESENT     Comment: Performed at [facility] Lab, [address], [place], [name] [address]  SARS Coronavirus 2 by RT PCR (hospital order, performed in [facility] hospital lab) *cepheid single result test* Anterior Nasal Swab     Status: None   Collection  Time: 01/07/23 11:38 PM   Specimen: Anterior Nasal Swab  Result Value Ref Range   SARS Coronavirus 2 by RT PCR NEGATIVE NEGATIVE    Comment: Performed at [facility] Lab, [address] [place], [name] [address]  CK     Status: None   Collection Time: 01/07/23 11:38 PM  Result Value Ref Range   Total CK 90 49 - 397 U/L    Comment: Performed at [facility] Lab, [address] [place], [name] [address]  CBC with Differential/Platelet     Status: Abnormal   Collection Time: 01/07/23 11:38 PM  Result Value Ref Range   WBC 10.1 4.0 - 10.5 K/uL   RBC 3.51 (L) 4.22 - 5.81 MIL/uL   Hemoglobin 10.5 (L) 13.0 - 17.0 g/dL   HCT 09.8 (L) 11.9 - 14.7 %   MCV 91.2 80.0 - 100.0 fL   MCH 29.9 26.0 - 34.0 pg   MCHC 32.8 30.0 - 36.0 g/dL   RDW 82.9 56.2 - 13.0 %   Platelets 373 150 - 400 K/uL   nRBC 0.0 0.0 - 0.2 %   Neutrophils Relative % 64 %   Neutro Abs 6.5 1.7 - 7.7 K/uL   Lymphocytes Relative 20 %   Lymphs Abs 2.0 0.7 - 4.0 K/uL   Monocytes Relative 10 %   Monocytes Absolute 1.0 0.1 - 1.0 K/uL   Eosinophils Relative 3 %   Eosinophils Absolute 0.3 0.0 - 0.5 K/uL   Basophils Relative 1 %   Basophils Absolute 0.1 0.0 - 0.1 K/uL    Immature Granulocytes 2 %   Abs Immature Granulocytes 0.16 (H) 0.00 - 0.07 K/uL    Comment: Performed at [facility] Lab, [address] [place], [name] [address]  Lactic acid, plasma     Status: None   Collection Time: 01/07/23 11:38 PM  Result Value Ref Range   Lactic Acid, Venous 1.4 0.5 - 1.9 mmol/L    Comment: Performed at [facility] Lab, [address] [place], [name] [address]  Magnesium     Status: None   Collection Time: 01/07/23 11:38 PM  Result Value Ref Range   Magnesium 1.9 1.7 - 2.4 mg/dL    Comment: Performed at [facility] Lab, [address], [place], [name] [address]  Phosphorus     Status: None   Collection Time: 01/07/23 11:38 PM  Result Value Ref Range   Phosphorus 3.0 2.5 - 4.6 mg/dL    Comment: Performed at [facility] Lab, [address] [place], [name] [address]  TSH     Status: None   Collection Time: 01/07/23 11:38 PM  Result Value Ref Range   TSH 0.882 0.350 - 4.500 uIU/mL    Comment: Performed by a 3rd Generation assay with a functional sensitivity of <=0.01 uIU/mL. Performed at [facility] Lab, [address], [place], [name] [address]   D-dimer, quantitative     Status: Abnormal   Collection Time: 01/07/23 11:38 PM  Result Value Ref Range   D-Dimer, Quant 0.88 (H) 0.00 - 0.50 ug/mL-FEU    Comment: (NOTE) At the manufacturer cut-off value of 0.5 g/mL FEU, this assay has a negative predictive value of 95-100%.This assay is intended for use in conjunction with a clinical pretest probability (PTP) assessment model to exclude pulmonary embolism (PE) and deep venous thrombosis (DVT) in outpatients suspected of PE or DVT. Results should be correlated with clinical presentation. Performed at [facility]  Lab, [address], [place], [name] [address]   Prealbumin     Status: Abnormal   Collection Time: 01/07/23 11:39 PM  Result Value Ref Range   Prealbumin 12 (L) 18 - 38 mg/dL    Comment: Performed at [facility]  Lab, [address] [place], [name] [address]  I-Stat venous blood gas, ED     Status: Abnormal   Collection Time: 01/08/23 12:12 AM  Result Value Ref Range   pH, Ven 7.376 7.25 - 7.43   pCO2, Ven 48.4 44 - 60 mmHg   pO2, Ven 40 32 - 45 mmHg   Bicarbonate 28.4 (H) 20.0 - 28.0 mmol/L   TCO2 30 22 - 32 mmol/L   O2 Saturation 72 %   Acid-Base Excess 3.0 (H) 0.0 - 2.0 mmol/L   Sodium 141 135 - 145 mmol/L   Potassium 4.3 3.5 - 5.1 mmol/L   Calcium, Ion 1.20 1.15 - 1.40 mmol/L   HCT 32.0 (L) 39.0 - 52.0 %   Hemoglobin 10.9 (L) 13.0 - 17.0 g/dL   Sample type VENOUS   Lactic acid, plasma     Status: None   Collection Time: 01/08/23  1:19 AM  Result Value Ref Range   Lactic Acid, Venous 0.9 0.5 - 1.9 mmol/L    Comment: Performed at [facility] Lab, [address], [place], [name] [address]  Comprehensive metabolic panel     Status: Abnormal   Collection Time: 01/08/23  7:35 AM  Result Value Ref Range   Sodium 140 135 - 145 mmol/L   Potassium 4.1 3.5 - 5.1 mmol/L   Chloride 107 98 - 111 mmol/L   CO2 26 22 - 32 mmol/L   Glucose, Bld 148 (H) 70 - 99 mg/dL    Comment: Glucose reference range applies only to samples taken after fasting for at least 8 hours.   BUN 21 8 - 23 mg/dL   Creatinine, Ser 6.29 (H) 0.61 - 1.24 mg/dL   Calcium 8.3 (L) 8.9 - 10.3 mg/dL   Total Protein 5.1 (L) 6.5 - 8.1 g/dL   Albumin 2.4 (L) 3.5 - 5.0 g/dL   AST 32 15 - 41 U/L   ALT 32 0 - 44 U/L   Alkaline Phosphatase 113 38 - 126 U/L   Total Bilirubin 1.0 0.3 - 1.2 mg/dL   GFR, Estimated 35 (L) >60 mL/min    Comment: (NOTE) Calculated using the CKD-EPI Creatinine Equation (2021)    Anion gap 7 5 - 15    Comment: Performed at [facility] Lab, [address], [place], [name] [address]  CBC     Status: Abnormal   Collection Time: 01/08/23  7:35 AM  Result Value Ref Range   WBC 11.2 (H) 4.0 - 10.5 K/uL   RBC 3.80 (L) 4.22 - 5.81 MIL/uL   Hemoglobin 11.0 (L) 13.0 - 17.0 g/dL   HCT 32.4 (L) 40.1 - 02.7 %    MCV 93.2 80.0 - 100.0 fL   MCH 28.9 26.0 - 34.0 pg   MCHC 31.1 30.0 - 36.0 g/dL   RDW 25.3 66.4 - 40.3 %   Platelets 401 (H) 150 - 400 K/uL   nRBC 0.0 0.0 - 0.2 %    Comment: Performed at [facility] Lab, [address] [place], [name] [address]  Vitamin B12     Status: Abnormal   Collection Time: 01/08/23  7:35 AM  Result Value Ref Range   Vitamin B-12 1,084 (H)  180 - 914 pg/mL    Comment: (NOTE) This assay is not validated for testing neonatal or myeloproliferative syndrome specimens for Vitamin B12 levels. Performed at [facility] Lab, [address] [place], [name] [address]   Folate     Status: None   Collection Time: 01/08/23  7:35 AM  Result Value Ref Range   Folate 19.1 >5.9 ng/mL    Comment: Performed at [facility] Lab, [address] [place], [name] [address]  Iron and TIBC     Status: Abnormal   Collection Time: 01/08/23  7:35 AM  Result Value Ref Range   Iron 43 (L) 45 - 182 ug/dL   TIBC 829 (L) 562 - 130 ug/dL   Saturation Ratios 20 17.9 - 39.5 %   UIBC 174 ug/dL    Comment: Performed at [facility] Lab, [address], [place], [name] [address]  Ferritin     Status: Abnormal   Collection Time: 01/08/23  7:35 AM  Result Value Ref Range   Ferritin 663 (H) 24 - 336 ng/mL    Comment: Performed at [facility] Lab, [address], [place], [name] [address]  Reticulocytes     Status: Abnormal   Collection Time: 01/08/23  8:11 AM  Result Value Ref Range   Retic Ct Pct 2.2 0.4 - 3.1 %   RBC. 3.73 (L) 4.22 - 5.81 MIL/uL   Retic Count, Absolute 82.4 19.0 - 186.0 K/uL   Immature Retic Fract 21.8 (H) 2.3 - 15.9 %    Comment: Performed at [facility] Lab, [address] [place], [name] [address]   US RENAL  Result Date: 01/08/2023 CLINICAL DATA:  Hematuria EXAM: RENAL / URINARY TRACT ULTRASOUND COMPLETE COMPARISON:  03/10/2022 FINDINGS: Right Kidney: Renal measurements: 10.3 x 5.3 x 4.8 cm = volume: 137 mL. Normal echotexture. No mass or  hydronephrosis Left Kidney: Renal measurements: 7.9 x 3.8 x 4.0 cm = volume: 62 mL. Pelvic kidney. 2.5 cm upper pole cyst is stable since prior study. Normal echotexture. No mass or hydronephrosis. Bladder: Appears normal for degree of bladder distention. Other: None. IMPRESSION: No acute findings.  No hydronephrosis. Electronically Signed   By: Charlett Nose M.D.   On: 01/08/2023 01:16   DG Ankle 2 Views Left  Result Date: 01/08/2023 CLINICAL DATA:  Left ankle pain. EXAM: LEFT ANKLE - 2 VIEW COMPARISON:  None Available. FINDINGS: There is no evidence of fracture, dislocation, or joint effusion. There is no evidence of arthropathy or other focal bone abnormality. Mild vascular calcification is seen. IMPRESSION: No acute osseous abnormality. Electronically Signed   By: Aram Candela M.D.   On: 01/08/2023 00:31   DG HIP UNILAT WITH PELVIS MIN 4 VIEWS RIGHT  Result Date: 01/08/2023 CLINICAL DATA:  Right hip pain. EXAM: DG HIP (WITH OR WITHOUT PELVIS) 4+V RIGHT COMPARISON:  None Available. FINDINGS: A total right hip replacement is seen. There is no evidence of surrounding lucency to suggest the presence of hardware loosening or infection. There is no evidence of an acute fracture or dislocation. IMPRESSION: Total right hip replacement without evidence of hardware loosening or infection. Electronically Signed   By: Aram Candela M.D.   On: 01/08/2023 00:30   DG Chest 2 View  Result Date: 01/08/2023 CLINICAL DATA:  Constipation. Altered mental status, encephalopathy. EXAM: CHEST - 2 VIEW; ABDOMEN - 1 VIEW COMPARISON:  None Available. FINDINGS: The heart size and mediastinal contours are within normal limits. Lung volumes are low.  No consolidation, effusion, or pneumothorax. Sternotomy wires are present over the midline. Degenerative changes are noted in the thoracic spine. No acute osseous abnormality. There is a nonobstructive bowel-gas pattern. A moderate amount of retained stool is present in the  colon. No definite renal calculus is seen. Total hip arthroplasty changes are present in the right. IMPRESSION: 1. No active cardiopulmonary disease. 2. No evidence of bowel obstruction. 3. Moderate amount of retained stool in the colon. Electronically Signed   By: Thornell Sartorius M.D.   On: 01/08/2023 00:30   DG Abd 1 View  Result Date: 01/08/2023 CLINICAL DATA:  Constipation. Altered mental status, encephalopathy. EXAM: CHEST - 2 VIEW; ABDOMEN - 1 VIEW COMPARISON:  None Available. FINDINGS: The heart size and mediastinal contours are within normal limits. Lung volumes are low. No consolidation, effusion, or pneumothorax. Sternotomy wires are present over the midline. Degenerative changes are noted in the thoracic spine. No acute osseous abnormality. There is a nonobstructive bowel-gas pattern. A moderate amount of retained stool is present in the colon. No definite renal calculus is seen. Total hip arthroplasty changes are present in the right. IMPRESSION: 1. No active cardiopulmonary disease. 2. No evidence of bowel obstruction. 3. Moderate amount of retained stool in the colon. Electronically Signed   By: Thornell Sartorius M.D.   On: 01/08/2023 00:30   CT Head Wo Contrast  Result Date: 01/07/2023 CLINICAL DATA:  Increased aphasia EXAM: CT HEAD WITHOUT CONTRAST TECHNIQUE: Contiguous axial images were obtained from the base of the skull through the vertex without intravenous contrast. RADIATION DOSE REDUCTION: This exam was performed according to the departmental dose-optimization program which includes automated exposure control, adjustment of the mA and/or kV according to patient size and/or use of iterative reconstruction technique. COMPARISON:  01/04/2023 FINDINGS: Brain: No evidence of acute infarction, hemorrhage, mass, mass effect, or midline shift. No hydrocephalus or extra-axial fluid collection. Mildly advanced cerebral atrophy for age. Periventricular white matter changes, likely the sequela of  chronic small vessel ischemic disease. Vascular: No hyperdense vessel. Atherosclerotic calcifications in the intracranial carotid and vertebral arteries. Skull: Negative for fracture or focal lesion. Sinuses/Orbits: Mucous retention cysts in the maxillary sinuses. Status post bilateral lens replacements. Other: The mastoid air cells are well aerated. IMPRESSION: No acute intracranial process. Electronically Signed   By: Wiliam Ke M.D.   On: 01/07/2023 18:48    Pending Labs Unresulted Labs (From admission, onward)     Start     Ordered   01/07/23 2311  Urine Culture  Add-on,   AD       Question:  Indication  Answer:  Altered mental status (if no other cause identified)   01/07/23 2310            Vitals/Pain Today's Vitals   01/08/23 0545 01/08/23 0630 01/08/23 0749 01/08/23 0920  BP: (!) 209/194 137/83  (!) 163/123  Pulse: 90 (!) 101    Resp: 20 20    Temp:   98.4 F (36.9 C)   TempSrc:   Axillary   SpO2: 91% 94%    Weight:      Height:      PainSc:        Isolation Precautions Airborne and Contact precautions  Medications Medications  apixaban (ELIQUIS) tablet 5 mg (has no administration in time range)  atorvastatin (LIPITOR) tablet 80 mg (has no administration in time range)  budesonide (PULMICORT) nebulizer solution 0.25 mg (has no administration in time range)  buPROPion (WELLBUTRIN XL) 24 hr tablet  150 mg (has no administration in time range)  DULoxetine (CYMBALTA) DR capsule 90 mg (has no administration in time range)  pantoprazole (PROTONIX) EC tablet 40 mg (has no administration in time range)  senna-docusate (Senokot-S) tablet 1 tablet (has no administration in time range)  tamsulosin (FLOMAX) capsule 0.4 mg (has no administration in time range)  ondansetron (ZOFRAN) tablet 4 mg (has no administration in time range)    Or  ondansetron (ZOFRAN) injection 4 mg (has no administration in time range)  polyethylene glycol (MIRALAX / GLYCOLAX) packet 17 g (has no  administration in time range)  bisacodyl (DULCOLAX) suppository 10 mg (has no administration in time range)  hydrALAZINE (APRESOLINE) tablet 25 mg (has no administration in time range)  amLODipine (NORVASC) tablet 5 mg (5 mg Oral Given 01/08/23 0920)  lactated ringers bolus 500 mL (0 mLs Intravenous Stopped 01/08/23 1029)    Followed by  lactated ringers infusion ( Intravenous New Bag/Given 01/08/23 1028)  feeding supplement (GLUCERNA SHAKE) (GLUCERNA SHAKE) liquid 237 mL (has no administration in time range)  insulin aspart (novoLOG) injection 0-15 Units (has no administration in time range)  insulin aspart (novoLOG) injection 0-5 Units (has no administration in time range)  oxyCODONE (Oxy IR/ROXICODONE) immediate release tablet 5 mg (has no administration in time range)  acetaminophen (TYLENOL) tablet 650 mg (has no administration in time range)  QUEtiapine (SEROQUEL) tablet 25 mg (has no administration in time range)  LORazepam (ATIVAN) injection 0.5 mg (0.5 mg Intravenous Given 01/08/23 0152)    Mobility non-ambulatory     Focused Assessments Neuro Assessment Handoff:  Swallow screen pass? Yes  Cardiac Rhythm: Bundle branch block, Other (Comment) (sinus arrhythmia)       Neuro Assessment: Exceptions to WDL Neuro Checks:      Has TPA been given? No If patient is a Neuro Trauma and patient is going to OR before floor call report to 4N Charge nurse: [phone] or [phone]   R Recommendations: See Admitting Provider Note  Report given to:   Additional Notes: MRI was unsuccessful last night with ativan. MD this morning said they would look into other meds to give to get MRI but with hx of math gravis he could not give antipsychotics.

## 2023-01-08 NOTE — Assessment & Plan Note (Signed)
Continue Fernando Hess patient not on beta-blocker given history of bradycardia

## 2023-01-08 NOTE — Progress Notes (Addendum)
Inpatient Diabetes Program Recommendations  AACE/ADA: New Consensus Statement on Inpatient Glycemic Control (2015)  Target Ranges:  Prepandial:   less than 140 mg/dL      Peak postprandial:   less than 180 mg/dL (1-2 hours)      Critically ill patients:  140 - 180 mg/dL   Lab Results  Component Value Date   GLUCAP 257 (H) 01/07/2023   HGBA1C 10.7 (H) 09/18/2022    Review of Glycemic Control  Latest Reference Range & Units 01/07/23 16:49  Glucose-Capillary 70 - 99 mg/dL 409 (H)   Diabetes history: DM  Outpatient Diabetes medications:  Basaglar 45 units q AM FS Libre sensor Humalog 0-14 units tid with  meals Current orders for Inpatient glycemic control:  Novolog 0-15 units tid with meals and HS  Inpatient Diabetes Program Recommendations:    Agree with current orders.  Consider adding Semglee 12 units daily.   Thanks,  Beryl Meager, RN, BC-ADM Inpatient Diabetes Coordinator Pager [phone]  (8a-5p)

## 2023-01-09 DIAGNOSIS — E1169 Type 2 diabetes mellitus with other specified complication: Secondary | ICD-10-CM | POA: Diagnosis not present

## 2023-01-09 DIAGNOSIS — M25572 Pain in left ankle and joints of left foot: Secondary | ICD-10-CM | POA: Diagnosis not present

## 2023-01-09 DIAGNOSIS — G9340 Encephalopathy, unspecified: Secondary | ICD-10-CM | POA: Diagnosis not present

## 2023-01-09 DIAGNOSIS — F418 Other specified anxiety disorders: Secondary | ICD-10-CM | POA: Diagnosis not present

## 2023-01-09 LAB — GLUCOSE, CAPILLARY
Glucose-Capillary: 191 mg/dL — ABNORMAL HIGH (ref 70–99)
Glucose-Capillary: 324 mg/dL — ABNORMAL HIGH (ref 70–99)
Glucose-Capillary: 116 mg/dL — ABNORMAL HIGH (ref 70–99)
Glucose-Capillary: 232 mg/dL — ABNORMAL HIGH (ref 70–99)
Glucose-Capillary: 268 mg/dL — ABNORMAL HIGH (ref 70–99)

## 2023-01-09 LAB — CBC
HCT: 38.2 % — ABNORMAL LOW (ref 39.0–52.0)
Hemoglobin: 12.3 g/dL — ABNORMAL LOW (ref 13.0–17.0)
MCH: 29.1 pg (ref 26.0–34.0)
MCHC: 32.2 g/dL (ref 30.0–36.0)
MCV: 90.5 fL (ref 80.0–100.0)
Platelets: 418 10*3/uL — ABNORMAL HIGH (ref 150–400)
RBC: 4.22 MIL/uL (ref 4.22–5.81)
RDW: 15.3 % (ref 11.5–15.5)
WBC: 11.2 10*3/uL — ABNORMAL HIGH (ref 4.0–10.5)
nRBC: 0 % (ref 0.0–0.2)

## 2023-01-09 LAB — RENAL FUNCTION PANEL
Albumin: 2.5 g/dL — ABNORMAL LOW (ref 3.5–5.0)
Anion gap: 11 (ref 5–15)
BUN: 13 mg/dL (ref 8–23)
CO2: 25 mmol/L (ref 22–32)
Calcium: 8.6 mg/dL — ABNORMAL LOW (ref 8.9–10.3)
Chloride: 102 mmol/L (ref 98–111)
Creatinine, Ser: 1.59 mg/dL — ABNORMAL HIGH (ref 0.61–1.24)
GFR, Estimated: 44 mL/min — ABNORMAL LOW (ref >60)
Glucose, Bld: 219 mg/dL — ABNORMAL HIGH (ref 70–99)
Phosphorus: 3.5 mg/dL (ref 2.5–4.6)
Potassium: 4.4 mmol/L (ref 3.5–5.1)
Sodium: 138 mmol/L (ref 135–145)

## 2023-01-09 LAB — URINE CULTURE: Culture: <10000 — AB

## 2023-01-09 LAB — MAGNESIUM: Magnesium: 1.8 mg/dL (ref 1.7–2.4)

## 2023-01-09 MED ORDER — QUETIAPINE FUMARATE 25 MG PO TABS: 25.0000 mg | ORAL_TABLET | Freq: Two times a day (BID) | ORAL | Status: DC | PRN

## 2023-01-09 MED ORDER — DULOXETINE HCL 60 MG PO CPEP
60.0000 mg | ORAL_CAPSULE | Freq: Every morning | ORAL | Status: DC
  Administered 2023-01-10: 60 mg via ORAL
  Filled 2023-01-09: qty 1

## 2023-01-09 MED ORDER — POLYETHYLENE GLYCOL 3350 17 G PO PACK: 17.0000 g | PACK | Freq: Two times a day (BID) | ORAL | 0 refills | Status: DC | PRN

## 2023-01-09 MED ORDER — INSULIN GLARGINE-YFGN 100 UNIT/ML ~~LOC~~ SOLN
15.0000 [IU] | Freq: Every day | SUBCUTANEOUS | Status: DC
  Administered 2023-01-09 – 2023-01-11 (×3): 15 [IU] via SUBCUTANEOUS
  Filled 2023-01-09 (×3): qty 0.15

## 2023-01-09 MED ORDER — DULOXETINE HCL 30 MG PO CPEP: 60.0000 mg | ORAL_CAPSULE | Freq: Every morning | ORAL | 3 refills | Status: DC

## 2023-01-09 MED ORDER — BASAGLAR KWIKPEN 100 UNIT/ML ~~LOC~~ SOPN: 35.0000 [IU] | PEN_INJECTOR | Freq: Every morning | SUBCUTANEOUS | Status: DC

## 2023-01-09 MED ORDER — SENNOSIDES-DOCUSATE SODIUM 8.6-50 MG PO TABS: 1.0000 | ORAL_TABLET | Freq: Two times a day (BID) | ORAL | Status: DC | PRN

## 2023-01-09 MED ORDER — POLYETHYLENE GLYCOL 3350 17 G PO PACK: 17.0000 g | PACK | Freq: Two times a day (BID) | ORAL | Status: DC | PRN

## 2023-01-09 MED ORDER — ADULT MULTIVITAMIN W/MINERALS CH
1.0000 | ORAL_TABLET | Freq: Every day | ORAL | Status: DC
  Administered 2023-01-09 – 2023-01-12 (×4): 1 via ORAL
  Filled 2023-01-09 (×3): qty 1

## 2023-01-09 MED ORDER — JUVEN PO PACK
1.0000 | PACK | Freq: Two times a day (BID) | ORAL | Status: DC
  Administered 2023-01-09 – 2023-01-11 (×3): 1 via ORAL
  Filled 2023-01-09 (×3): qty 1

## 2023-01-09 NOTE — Progress Notes (Signed)
Provided pt's wife with current offers and she has elected for pt to return to [facility]. Confirmed with Marchelle Folks at [facility] that pt is able to return pending auth. Requested CMA begin [employer] auth. Anticipate dc back to [facility] Monday. SW will follow.   Dellie Burns, MSW, LCSW [phone] (coverage)

## 2023-01-09 NOTE — Evaluation (Signed)
Physical Therapy Evaluation Patient Details Name: Fernando Hess MRN: [id number] DOB: Dec 22, 1945 Today's Date: 01/09/2023  History of Present Illness  Pt is a 77 y.o. male who presented from [facility] 01/07/23 with confusion and slurred speech. Admitted for acute encephalopathy. CT head negative for acute intracranial process. PMH: DM, HTN, HLD, sleep apnea, PAD, CAD s/p quintuple bypass, CKD, CHF, myasthenia gravis, Afib, dementia, recent hip fx now s/p Right hip hemiarthroplasty 12/26/22   Clinical Impression  Pt presents with condition above and deficits mentioned below, see PT Problem List. Pt recently was hospitalized and discharged to a SNF for rehab. Currently, pt demonstrates deficits in cognition, balance, strength, power, activity tolerance, and coordination. He demonstrates a retropulsion initially upon standing, needing min-modAx2 for transfers. Pt also displays an ataxic-like gait pattern, taking very narrow steps, needing modAx2 for balance and RW management. Pt would benefit from further inpatient rehab, <3 hours/day, to improve his safety and independence with all functional mobility. Will continue to follow acutely.      Recommendations for follow up therapy are one component of a multi-disciplinary discharge planning process, led by the attending physician.  Recommendations may be updated based on patient status, additional functional criteria and insurance authorization.  Follow Up Recommendations Can patient physically be transported by private vehicle: No     Assistance Recommended at Discharge Frequent or constant Supervision/Assistance  Patient can return home with the following  Two people to help with walking and/or transfers;Assistance with cooking/housework;Assist for transportation;Help with stairs or ramp for entrance;A lot of help with bathing/dressing/bathroom;Direct supervision/assist for medications management;Direct supervision/assist for financial management     Equipment Recommendations Other (comment) (defer to next venue of care)  Recommendations for Other Services       Functional Status Assessment Patient has had a recent decline in their functional status and demonstrates the ability to make significant improvements in function in a reasonable and predictable amount of time.     Precautions / Restrictions Precautions Precautions: Fall Restrictions Weight Bearing Restrictions: Yes RLE Weight Bearing: Weight bearing as tolerated Other Position/Activity Restrictions: no formal hip precautions      Mobility  Bed Mobility Overal bed mobility: Needs Assistance Bed Mobility: Supine to Sit     Supine to sit: Min guard, HOB elevated     General bed mobility comments: HOB elevated with rail given min guard for safety    Transfers Overall transfer level: Needs assistance Equipment used: Rolling walker (2 wheels) Transfers: Sit to/from Stand Sit to Stand: +2 safety/equipment, From elevated surface, Mod assist, Min assist           General transfer comment: cueing for hand placement and safety, mod assist +2 fading to min assist +2 with posterior lean and unsteadiness    Ambulation/Gait Ambulation/Gait assistance: Mod assist, +2 physical assistance, +2 safety/equipment Gait Distance (Feet): 14 Feet (x2 bouts of ~14 ft > ~5 ft) Assistive device: Rolling walker (2 wheels) Gait Pattern/deviations: Step-to pattern, Decreased step length - right, Decreased step length - left, Decreased stride length, Narrow base of support, Ataxic, Trunk flexed Gait velocity: reduced Gait velocity interpretation: <1.31 ft/sec, indicative of household ambulator   General Gait Details: Pt with ataxic like gait pattern and very narrow BOS, often stepping on his own feet, needing therapist to place her own foot between his while ambulating to maintain a wider BOS. ModAx2 for stability and RW guidance.  Stairs            Wheelchair Mobility     Modified  Rankin (Stroke Patients Only) Modified Rankin (Stroke Patients Only) Pre-Morbid Rankin Score: Moderately severe disability Modified Rankin: Moderately severe disability     Balance Overall balance assessment: Needs assistance Sitting-balance support: Feet supported, Bilateral upper extremity supported Sitting balance-Leahy Scale: Fair Sitting balance - Comments: min guard statically Postural control: Posterior lean Standing balance support: Bilateral upper extremity supported, During functional activity Standing balance-Leahy Scale: Poor Standing balance comment: relies on BUE and external support, posterior bias initally                             Pertinent Vitals/Pain Pain Assessment Pain Assessment: Faces Faces Pain Scale: Hurts little more Pain Location: Rt hip Pain Descriptors / Indicators: Discomfort Pain Intervention(s): Limited activity within patient's tolerance, Monitored during session, Repositioned    Home Living Family/patient expects to be discharged to:: Skilled nursing facility                        Prior Function Prior Level of Function : Needs assist             Mobility Comments: pt was at rehab, reports needing assist for transfers and limited mobility; per chart review using rollator at home with frequent falls at baseline ADLs Comments: pt reports needing assist for bathing/dressing, able to self feed and groom     Hand Dominance   Dominant Hand: Right    Extremity/Trunk Assessment   Upper Extremity Assessment Upper Extremity Assessment: Defer to OT evaluation    Lower Extremity Assessment Lower Extremity Assessment: Generalized weakness;RLE deficits/detail RLE Deficits / Details: R hip stiffness and pain, likely from surgery    Cervical / Trunk Assessment Cervical / Trunk Assessment: Normal  Communication   Communication: No difficulties  Cognition Arousal/Alertness: Awake/alert Behavior During  Therapy: WFL for tasks assessed/performed Overall Cognitive Status: No family/caregiver present to determine baseline cognitive functioning Area of Impairment: Orientation, Attention, Memory, Following commands, Safety/judgement, Problem solving, Awareness                 Orientation Level: Disoriented to, Situation Current Attention Level: Sustained Memory: Decreased short-term memory Following Commands: Follows one step commands consistently, Follows one step commands with increased time, Follows multi-step commands inconsistently Safety/Judgement: Decreased awareness of safety, Decreased awareness of deficits Awareness: Emergent Problem Solving: Slow processing, Decreased initiation, Difficulty sequencing, Requires verbal cues General Comments: patient does not recall having surgery on his hip or having dcd to rehab, actually thinks he is having surgery soon.  He requires cueing for safety, redirection and problem solving during session.        General Comments General comments (skin integrity, edema, etc.): friend at side and supportive; SpO2 93-94% on RA, unable to get reading when ambulating    Exercises General Exercises - Lower Extremity Hip ABduction/ADduction: AROM, Both, 5 reps, Seated Hip Flexion/Marching: AROM, Both, 5 reps, Seated   Assessment/Plan    PT Assessment Patient needs continued PT services  PT Problem List Decreased strength;Decreased range of motion;Decreased activity tolerance;Decreased balance;Decreased mobility;Decreased coordination;Decreased cognition;Decreased knowledge of use of DME;Decreased safety awareness;Pain       PT Treatment Interventions DME instruction;Gait training;Functional mobility training;Therapeutic activities;Therapeutic exercise;Balance training;Neuromuscular re-education;Cognitive remediation;Patient/family education;Wheelchair mobility training    PT Goals (Current goals can be found in the Care Plan section)  Acute Rehab  PT Goals Patient Stated Goal: to improve PT Goal Formulation: With patient Time For Goal Achievement: 01/23/23 Potential to Achieve Goals: Fair  Frequency Min 3X/week     Co-evaluation PT/OT/SLP Co-Evaluation/Treatment: Yes Reason for Co-Treatment: For patient/therapist safety;To address functional/ADL transfers;Necessary to address cognition/behavior during functional activity PT goals addressed during session: Mobility/safety with mobility;Balance;Proper use of DME OT goals addressed during session: ADL's and self-care       AM-PAC PT "6 Clicks" Mobility  Outcome Measure Help needed turning from your back to your side while in a flat bed without using bedrails?: A Little Help needed moving from lying on your back to sitting on the side of a flat bed without using bedrails?: A Little Help needed moving to and from a bed to a chair (including a wheelchair)?: Total Help needed standing up from a chair using your arms (e.g., wheelchair or bedside chair)?: Total Help needed to walk in hospital room?: Total Help needed climbing 3-5 steps with a railing? : Total 6 Click Score: 10    End of Session Equipment Utilized During Treatment: Gait belt Activity Tolerance: Patient tolerated treatment well Patient left: in chair;with call bell/phone within reach;with chair alarm set;with family/visitor present Nurse Communication: Mobility status (NT) PT Visit Diagnosis: Unsteadiness on feet (R26.81);Repeated falls (R29.6);Muscle weakness (generalized) (M62.81);History of falling (Z91.81);Difficulty in walking, not elsewhere classified (R26.2);Other symptoms and signs involving the nervous system (R29.898);Pain;Other abnormalities of gait and mobility (R26.89) Pain - Right/Left: Right Pain - part of body: Hip    Time: 1610-9604 PT Time Calculation (min) (ACUTE ONLY): 24 min   Charges:   PT Evaluation $PT Eval Moderate Complexity: 1 Mod          Raymond Gurney, PT, DPT Acute  Rehabilitation Services  Office: [phone]   Jewel Baize 01/09/2023, 2:53 PM

## 2023-01-09 NOTE — Progress Notes (Signed)
Inpatient Diabetes Program Recommendations  AACE/ADA: New Consensus Statement on Inpatient Glycemic Control (2015)  Target Ranges:  Prepandial:   less than 140 mg/dL      Peak postprandial:   less than 180 mg/dL (1-2 hours)      Critically ill patients:  140 - 180 mg/dL   Lab Results  Component Value Date   GLUCAP 191 (H) 01/09/2023   HGBA1C 10.7 (H) 09/18/2022    Review of Glycemic Control  Latest Reference Range & Units 01/07/23 16:49 01/08/23 13:43 01/08/23 16:49 01/08/23 21:07 01/09/23 06:23  Glucose-Capillary 70 - 99 mg/dL 161 (H) 096 (H) 045 (H) 158 (H) 191 (H)    Diabetes history: DM  Outpatient Diabetes medications:  Basaglar 45 units q AM FS Libre sensor Humalog 0-14 units tid with  meals Current orders for Inpatient glycemic control:  Novolog 0-15 units tid with meals and HS  Inpatient Diabetes Program Recommendations:    Agree with current orders.  Consider adding Semglee 10 units daily.   Thanks, Christena Deem RN, MSN, BC-ADM Inpatient Diabetes Coordinator Team Pager [phone] (8a-5p)

## 2023-01-09 NOTE — Progress Notes (Signed)
Initial Nutrition Assessment  DOCUMENTATION CODES:   Not applicable  INTERVENTION:  Continue current diet as ordered Add auto trays MVI with minerals Glucerna Shake po TID, each supplement provides 220 kcal and 10 grams of protein 1 packet Juven BID, each packet provides 95 calories, 2.5 grams of protein (collagen), and 9.8 grams of carbohydrate (3 grams sugar); also contains 7 grams of L-arginine and L-glutamine, 300 mg vitamin C, 15 mg vitamin E, 1.2 mcg vitamin B-12, 9.5 mg zinc, 200 mg calcium, and 1.5 g  Calcium Beta-hydroxy-Beta-methylbutyrate to support wound healing  NUTRITION DIAGNOSIS:  Inadequate oral intake related to lethargy/confusion as evidenced by per patient/family report.  GOAL:   Patient will meet greater than or equal to 90% of their needs  MONITOR:   PO intake, Supplement acceptance, I & O's, Weight trends, Labs  REASON FOR ASSESSMENT:   Consult Assessment of nutrition requirement/status  ASSESSMENT:   Pt with hx of CAD s/p CABG, CHF, CKD3, GERD, atrial fibrillation, MI, HTN, HLD, DM type 2, dementia, and PAD presented to ED from rehab facility with AMS. Recently admitted for hip fx.  Pt resting in bedside chair at the time of assessment. Awake and alert but forgetful and seems to have trouble following conversation at times. Breakfast tray at bedside well consumed ~75%. Pt reports appetite has gone up and down. Reports stable weight though. Also reports that he was able to get up and move around the room with therapy earlier.   Pt with some mild deficits on exam but could be due to normal again as well as recent decrease in mobility from hip fx. Will add nutrition supplements to augment intake as well as juven to promote surgical wound healing.  Nutritionally Relevant Medications: Scheduled Meds:  atorvastatin  80 mg Oral QPM   GLUCERNA SHAKE  237 mL Oral TID BM   insulin aspart  0-15 Units Subcutaneous TID WC   insulin aspart  0-5 Units Subcutaneous  QHS   pantoprazole  40 mg Oral q AM   Continuous Infusions:  lactated ringers 75 mL/hr at 01/09/23 0800   PRN Meds:.bisacodyl, ondansetron, polyethylene glycol, senna-docusate  Labs Reviewed: Creatinine 1.59 CBG ranges from 158-257 mg/dL over the last 24 hours HgbA1c 10.7% (1/4)  NUTRITION - FOCUSED PHYSICAL EXAM: Flowsheet Row Most Recent Value  Orbital Region No depletion  Upper Arm Region No depletion  Thoracic and Lumbar Region No depletion  Buccal Region No depletion  Temple Region Mild depletion  Clavicle Bone Region No depletion  Clavicle and Acromion Bone Region Mild depletion  Scapular Bone Region No depletion  Dorsal Hand Mild depletion  Patellar Region No depletion  Anterior Thigh Region No depletion  Posterior Calf Region No depletion  Edema (RD Assessment) None  Hair Reviewed  Eyes Reviewed  Mouth Reviewed  Skin Reviewed  Nails Reviewed   Diet Order:   Diet Order             Diet Carb Modified Fluid consistency: Thin; Room service appropriate? Yes  Diet effective now                   EDUCATION NEEDS:   Not appropriate for education at this time  Skin:  Skin Assessment: Reviewed RN Assessment  Last BM:  unsure  Height:   Ht Readings from Last 1 Encounters:  01/07/23 5\' 6"  (1.676 m)    Weight:   Wt Readings from Last 1 Encounters:  01/07/23 68.9 kg    Ideal Body Weight:  64.5 kg  BMI:  Body mass index is 24.53 kg/m.  Estimated Nutritional Needs:   Kcal:  1700-1900 kcal/d  Protein:  80-100 g/d  Fluid:  >/=1.8L/d    Greig Castilla, RD, LDN Clinical Dietitian RD pager # available in [facility]  After hours/weekend pager # available in [facility]

## 2023-01-09 NOTE — Progress Notes (Signed)
PROGRESS NOTE  Fernando Hess Fernando Hess DOB: [birth year]   PCP: [name], MD  Patient is from: Facility  DOA: 01/07/2023 LOS: 1  Chief complaints Chief Complaint  Patient presents with   Altered Mental Status     Brief Narrative / Interim history: 77 year old M with PMH of dementia, CAD, diastolic CHF, OSA and hypoxic RF on 2 L, paroxysmal A-fib, CKD-3B, DM-2 and recent right hip fracture and repair brought to ED due to confusion and slurred speech and admitted for acute encephalopathy.  Workup in ED including vitals, COVID-19 PCR, CTH, VBG, CXR, CMP, CBC, TSH and UDS unrevealing.  UA with hematuria.  Concern about polypharmacy (pain meds), delirium and dehydration.  Admitted for further evaluation and care.  Patient's encephalopathy improved after stopping opiates, decreasing Seroquel and IV fluid hydration.  Seems he is back to baseline and ready to go to SNF once bed available.    Subjective: Seen and examined earlier this morning and this afternoon.  Seems he had an episode of confusion last night and required IV Haldol.  Looks calm this morning and this afternoon.  Has no recollection into why he was brought to the hospital.  He is oriented x 4 except date and year.  Denies pain, shortness of breath, GI or UTI symptoms.  Objective: Vitals:   01/09/23 0407 01/09/23 0858 01/09/23 0909 01/09/23 1137  BP: (!) 171/90 (!) 161/117  132/78  Pulse: 100 92  93  Resp: 17 18    Temp: 97.6 F (36.4 C) 98.1 F (36.7 C)  97.9 F (36.6 C)  TempSrc: Oral Oral  Oral  SpO2: 95% 97% 97% 95%  Weight:      Height:        Examination:  GENERAL: No apparent distress.  Nontoxic. HEENT: MMM.  Vision and hearing grossly intact.  NECK: Supple.  No apparent JVD.  RESP:  No IWOB.  Fair aeration bilaterally. CVS:  RRR. Heart sounds normal.  ABD/GI/GU: BS+. Abd soft, NTND.  MSK/EXT:   No apparent deformity. Moves extremities. No edema.  SKIN: Surgical wound over right hip  DCI. NEURO: Awake and alert. Oriented appropriately.  No apparent focal neuro deficit other than some coarse tremor. PSYCH: Calm. Normal affect.   Procedures:  None  Microbiology summarized: COVID-19 PCR nonreactive  Assessment and plan: Principal Problem:   Acute encephalopathy Active Problems:   Fracture of femoral neck, right, closed ([name])   Paroxysmal atrial fibrillation ([name])   Coronary artery disease involving native coronary artery of native heart without angina pectoris   Chronic heart failure with preserved ejection fraction (HFpEF) ([name])   Diabetes mellitus, type 2 ([name])   Chronic kidney disease, stage 3b ([name])   Hyperlipidemia LDL goal <70   OSA on CPAP   Constipation   Depression with anxiety   Left ankle pain   Hematuria  Acute encephalopathy/dementia with behavioral disturbance/delirium: Most likely polypharmacy from opiates, Robaxin and Seroquel, and subsequent dehydration.  No focal neurodeficit to suggest CVA.  Low suspicion for infectious process. COVID-19 PCR, ammonia, CTH, VBG, CXR, CMP, CBC, TSH and UDS and UA unrevealing.  Encephalopathy seems to have resolved.  He is currently oriented x 4 except date and year.  -Reorientation and delirium precaution -Discontinue MRI brain -Discontinue opiate and Robaxin -Tylenol 650 mg every 6 hours while awake for pain -Ensure hydration.  -Needs formal cognitive evaluation by PCP or neurology when he is at baseline -PT/OT -SLP for linguistic and cognitive evaluation per request by wife  CAD/chronic diastolic CHF:   TTE on 4/16 with LVEF of 60 to 65%, G1-DD.  Does not seem to be on diuretics.  Looked dehydrated on presentation.  Appears euvolemic now. -Monitor respiratory status and fluid status while on IV fluid  Paroxysmal A-fib: Rate controlled without medication. -Continue home Eliquis  Chronic hypoxic RF/OSA-seems he is on 2 L at baseline.  VBG reassuring.  Stable.  CKD-3B: Stable. Recent Labs     12/25/22 0126 12/26/22 0847 12/27/22 1154 12/29/22 0203 12/31/22 0952 01/01/23 0316 01/04/23 0625 01/07/23 1638 01/08/23 0735 01/09/23 0653  BUN 23 39* 42* 31* 32* 33* 28* 26* 21 13  CREATININE 2.19* 2.61* 2.26* 2.09* 1.87* 1.91* 2.07* 2.10* 1.94* 1.59*  -Avoid nephrotoxic meds  Essential hypertension?  BP within acceptable range now.  Does not seem to be on medication at home. -Low-dose amlodipine -Hydralazine as needed  Right hip fracture s/p repair on 4/12: Surgical wound appears DCI.  X-ray without evidence of hardware loosening or infection. -PT/OT -Pain control with scheduled Tylenol  IDDM-2 with hyperglycemia: A1c 10.7% on 1/4.[name] Recent Labs  Lab 01/08/23 1343 01/08/23 1649 01/08/23 2107 01/09/23 0623 01/09/23 1138  GLUCAP 139* 160* 158* 191* 324*  -Start SSI-moderate -Start Semglee 15 units daily -Follow hemoglobin A1c  Constipation?  KUB without significant finding. -Bowel regimen  Anxiety and depression: On Wellbutrin and significant dose of Cymbalta. -Decrease Cymbalta to 60 mg daily given his renal function -Continue home Wellbutrin  Left ankle pain-x-ray without significant finding.  Exam reassuring.  General tremor/rigidity: Patient reported myasthenia gravis but this has never been confirmed and neurology does not feel that his testing is consistent with MG  -Outpatient follow-up as previously planned  BPH -Continue Flomax -Monitor urine output   Body mass index is 24.53 kg/m.   Nutrition Problem: Inadequate oral intake Etiology: lethargy/confusion Signs/Symptoms: per patient/family report Interventions: Refer to RD note for recommendations   DVT prophylaxis:  SCDs Start: 01/08/23 1038 apixaban (ELIQUIS) tablet 5 mg  Code Status: Full code Family Communication: Updated patient's wife over the phone and at the bedside Level of care: Telemetry Medical Status is: Inpatient The patient will remain inpatient because: Safe  disposition.   Final disposition: SNF Consultants:  None  35 minutes with more than 50% spent in reviewing records, counseling patient/family and coordinating care.   Sch Meds:  Scheduled Meds:  acetaminophen  650 mg Oral Q6H WA   amLODipine  5 mg Oral Daily   apixaban  5 mg Oral BID   atorvastatin  80 mg Oral QPM   budesonide  0.25 mg Nebulization BID   buPROPion  150 mg Oral q morning   [START ON 01/10/2023] DULoxetine  60 mg Oral q morning   feeding supplement (GLUCERNA SHAKE)  237 mL Oral TID BM   insulin aspart  0-15 Units Subcutaneous TID WC   insulin aspart  0-5 Units Subcutaneous QHS   insulin glargine-yfgn  15 Units Subcutaneous Daily   multivitamin with minerals  1 tablet Oral Daily   nutrition supplement (JUVEN)  1 packet Oral BID BM   pantoprazole  40 mg Oral q AM   QUEtiapine  25 mg Oral QHS   tamsulosin  0.4 mg Oral QPC breakfast   Continuous Infusions:  lactated ringers Stopped (01/09/23 0935)   PRN Meds:.bisacodyl, hydrALAZINE, ondansetron **OR** ondansetron (ZOFRAN) IV, polyethylene glycol, QUEtiapine, senna-docusate  Antimicrobials: Anti-infectives (From admission, onward)    None        I have personally reviewed the following labs  and images: CBC: Recent Labs  Lab 01/04/23 0625 01/07/23 1638 01/07/23 2338 01/08/23 0012 01/08/23 0735 01/09/23 0653  WBC 12.5* 10.0 10.1  --  11.2* 11.2*  NEUTROABS 8.1* 6.8 6.5  --   --   --   HGB 11.2* 11.0* 10.5* 10.9* 11.0* 12.3*  HCT 35.9* 34.2* 32.0* 32.0* 35.4* 38.2*  MCV 92.1 90.2 91.2  --  93.2 90.5  PLT 318 399 373  --  401* 418*   BMP &GFR Recent Labs  Lab 01/04/23 0625 01/07/23 1638 01/07/23 2338 01/08/23 0012 01/08/23 0735 01/09/23 0653  NA 138 139  --  141 140 138  K 4.4 5.0  --  4.3 4.1 4.4  CL 108 103  --   --  107 102  CO2 25 26  --   --  26 25  GLUCOSE 177* 286*  --   --  148* 219*  BUN 28* 26*  --   --  21 13  CREATININE 2.07* 2.10*  --   --  1.94* 1.59*  CALCIUM 8.3* 8.6*   --   --  8.3* 8.6*  MG  --  2.0 1.9  --   --  1.8  PHOS  --   --  3.0  --   --  3.5   Estimated Creatinine Clearance: 34.6 mL/min (A) (by C-G formula based on SCr of 1.59 mg/dL (H)). Liver & Pancreas: Recent Labs  Lab 01/04/23 0625 01/07/23 1638 01/08/23 0735 01/09/23 0653  AST 31 39 32  --   ALT 33 31 32  --   ALKPHOS 107 128* 113  --   BILITOT 0.9 0.9 1.0  --   PROT 5.4* 5.2* 5.1*  --   ALBUMIN 2.4* 2.4* 2.4* 2.5*   No results for input(s): "LIPASE", "AMYLASE" in the last 168 hours. Recent Labs  Lab 01/08/23 1417  AMMONIA 24   Diabetic: No results for input(s): "HGBA1C" in the last 72 hours. Recent Labs  Lab 01/08/23 1343 01/08/23 1649 01/08/23 2107 01/09/23 0623 01/09/23 1138  GLUCAP 139* 160* 158* 191* 324*   Cardiac Enzymes: Recent Labs  Lab 01/07/23 2338  CKTOTAL 90   No results for input(s): "PROBNP" in the last 8760 hours. Coagulation Profile: Recent Labs  Lab 01/04/23 0625  INR 1.6*   Thyroid Function Tests: Recent Labs    01/07/23 2338  TSH 0.882   Lipid Profile: No results for input(s): "CHOL", "HDL", "LDLCALC", "TRIG", "CHOLHDL", "LDLDIRECT" in the last 72 hours. Anemia Panel: Recent Labs    01/08/23 0735 01/08/23 0811  VITAMINB12 1,084*  --   FOLATE 19.1  --   FERRITIN 663*  --   TIBC 217*  --   IRON 43*  --   RETICCTPCT  --  2.2   Urine analysis:    Component Value Date/Time   COLORURINE AMBER (A) 01/07/2023 2203   APPEARANCEUR HAZY (A) 01/07/2023 2203   LABSPEC 1.018 01/07/2023 2203   PHURINE 5.0 01/07/2023 2203   GLUCOSEU NEGATIVE 01/07/2023 2203   HGBUR LARGE (A) 01/07/2023 2203   BILIRUBINUR NEGATIVE 01/07/2023 2203   KETONESUR NEGATIVE 01/07/2023 2203   PROTEINUR 100 (A) 01/07/2023 2203   UROBILINOGEN 0.2 10/02/2009 1604   NITRITE NEGATIVE 01/07/2023 2203   LEUKOCYTESUR SMALL (A) 01/07/2023 2203   Sepsis Labs: Invalid input(s): "PROCALCITONIN", "LACTICIDVEN"  Microbiology: Recent Results (from the past 240  hour(s))  SARS Coronavirus 2 by RT PCR (hospital order, performed in [facility] hospital lab) *cepheid single result test* Anterior Nasal  Swab     Status: None   Collection Time: 01/07/23 11:38 PM   Specimen: Anterior Nasal Swab  Result Value Ref Range Status   SARS Coronavirus 2 by RT PCR NEGATIVE NEGATIVE Final    Comment: Performed at [facility], [address], [place], [name] [address]    Radiology Studies: No results found.    Celsey Asselin T. Olga Bourbeau [facility]  If 7PM-7AM, please contact night-coverage [email or web address] 01/09/2023, 4:04 PM

## 2023-01-09 NOTE — Evaluation (Signed)
Occupational Therapy Evaluation Patient Details Name: Fernando Hess MRN: [id number] DOB: 08/20/1946 Today's Date: 01/09/2023   History of Present Illness Pt is a 77 y.o. male who presented from [facility] 01/07/23 with confusion and slurred speech. Admitted for acute encephalopathy. CT head negative for acute intracranial process. PMH: DM, HTN, HLD, sleep apnea, PAD, CAD s/p quintuple bypass, CKD, CHF, myasthenia gravis, Afib, dementia, recent hip fx now s/p Right hip hemiarthroplasty 12/26/22   Clinical Impression   Patient admitted from SNF for above.  Pt with no recall of prior admission with hip surgery, thinking he is here for his hip surgery. Patient currently requires min guard for bed mobility, min-mod assist +2 for transfers and setup to max assist for Adls. Patient requires redirection throughout session with decreased attention, safety, and problem solving. Based on performance today, believe he will best benefit from further OT services acutely and after dc at inpatient setting with <3hrs/day to decreased burden of care, risk of falls, and optimize safety with Adls and mobility.      Recommendations for follow up therapy are one component of a multi-disciplinary discharge planning process, led by the attending physician.  Recommendations may be updated based on patient status, additional functional criteria and insurance authorization.   Assistance Recommended at Discharge Frequent or constant Supervision/Assistance  Patient can return home with the following A lot of help with bathing/dressing/bathroom;Direct supervision/assist for medications management;Direct supervision/assist for financial management;Assistance with cooking/housework;Assist for transportation;Help with stairs or ramp for entrance;Two people to help with walking and/or transfers    Functional Status Assessment  Patient has had a recent decline in their functional status and demonstrates the ability to make significant  improvements in function in a reasonable and predictable amount of time.  Equipment Recommendations  Other (comment) (defer)    Recommendations for Other Services       Precautions / Restrictions Precautions Precautions: Fall Restrictions Weight Bearing Restrictions: Yes RLE Weight Bearing: Weight bearing as tolerated      Mobility Bed Mobility Overal bed mobility: Needs Assistance Bed Mobility: Supine to Sit     Supine to sit: Min guard, HOB elevated     General bed mobility comments: HOB elevated with rail given min guard for safety    Transfers Overall transfer level: Needs assistance Equipment used: Rolling walker (2 wheels) Transfers: Sit to/from Stand Sit to Stand: +2 safety/equipment, From elevated surface, Mod assist, Min assist           General transfer comment: cueing for hand placement and safety, mod assist +2 fading to min assist +2 with posterior lean and unsteadiness      Balance Overall balance assessment: Needs assistance Sitting-balance support: Feet supported, Bilateral upper extremity supported Sitting balance-Leahy Scale: Fair Sitting balance - Comments: min guard statically   Standing balance support: Bilateral upper extremity supported, During functional activity Standing balance-Leahy Scale: Poor Standing balance comment: relies on BUE and external support, posterior bias initally                           ADL either performed or assessed with clinical judgement   ADL Overall ADL's : Needs assistance/impaired     Grooming: Set up;Sitting;Wash/dry face           Upper Body Dressing : Minimal assistance;Sitting   Lower Body Dressing: Maximal assistance;+2 for physical assistance;+2 for safety/equipment;Sit to/from stand Lower Body Dressing Details (indicate cue type and reason): able to don L sock in bed  but assist for R, min-mod assist +2 to stand Toilet Transfer: Moderate assistance;+2 for physical assistance;+2 for  safety/equipment;Rolling walker (2 wheels) Toilet Transfer Details (indicate cue type and reason): simulated in room to recliner         Functional mobility during ADLs: Moderate assistance;Minimal assistance;+2 for physical assistance;+2 for safety/equipment;Rolling walker (2 wheels);Cueing for sequencing;Cueing for safety       Vision   Vision Assessment?: No apparent visual deficits     Perception     Praxis      Pertinent Vitals/Pain Pain Assessment Pain Assessment: Faces Faces Pain Scale: Hurts little more Pain Location: Rt hip Pain Descriptors / Indicators: Discomfort Pain Intervention(s): Monitored during session, Repositioned     Hand Dominance Right   Extremity/Trunk Assessment Upper Extremity Assessment Upper Extremity Assessment: Generalized weakness   Lower Extremity Assessment Lower Extremity Assessment: Defer to PT evaluation       Communication Communication Communication: No difficulties   Cognition Arousal/Alertness: Awake/alert Behavior During Therapy: WFL for tasks assessed/performed Overall Cognitive Status: No family/caregiver present to determine baseline cognitive functioning Area of Impairment: Orientation, Attention, Memory, Following commands, Safety/judgement, Problem solving, Awareness                 Orientation Level: Disoriented to, Situation Current Attention Level: Sustained Memory: Decreased short-term memory Following Commands: Follows one step commands consistently, Follows one step commands with increased time, Follows multi-step commands inconsistently Safety/Judgement: Decreased awareness of safety, Decreased awareness of deficits Awareness: Emergent Problem Solving: Slow processing, Decreased initiation, Difficulty sequencing, Requires verbal cues General Comments: patient does not recall having surgery on his hip or having dcd to rehab, actually thinks he is having surgery soon.  He requires cueing for safety,  redirection and problem solving during session.     General Comments  friend at side and supportive    Exercises     Shoulder Instructions      Home Living Family/patient expects to be discharged to:: Skilled nursing facility                                        Prior Functioning/Environment Prior Level of Function : Needs assist             Mobility Comments: pt was at rehab, reports needing assist for transfers and limited mobility; per chart review using rollator at home with frequent falls at baseline ADLs Comments: pt reports needing assist for bathing/dressing, able to self feed and groom        OT Problem List: Decreased strength;Decreased range of motion;Decreased activity tolerance;Impaired balance (sitting and/or standing);Decreased cognition;Decreased safety awareness;Decreased knowledge of use of DME or AE;Decreased knowledge of precautions;Impaired tone;Pain;Decreased coordination      OT Treatment/Interventions: Therapeutic exercise;Self-care/ADL training;Neuromuscular education;DME and/or AE instruction;Energy conservation;Therapeutic activities;Cognitive remediation/compensation;Patient/family education;Balance training    OT Goals(Current goals can be found in the care plan section) Acute Rehab OT Goals Patient Stated Goal: get to rehab OT Goal Formulation: With patient Time For Goal Achievement: 01/23/23 Potential to Achieve Goals: Good  OT Frequency: Min 2X/week    Co-evaluation PT/OT/SLP Co-Evaluation/Treatment: Yes Reason for Co-Treatment: For patient/therapist safety;To address functional/ADL transfers;Necessary to address cognition/behavior during functional activity   OT goals addressed during session: ADL's and self-care      AM-PAC OT "6 Clicks" Daily Activity     Outcome Measure Help from another person eating meals?: A Little Help from another person  taking care of personal grooming?: A Little Help from another person  toileting, which includes using toliet, bedpan, or urinal?: A Lot Help from another person bathing (including washing, rinsing, drying)?: A Lot Help from another person to put on and taking off regular upper body clothing?: A Little Help from another person to put on and taking off regular lower body clothing?: A Lot 6 Click Score: 15   End of Session Equipment Utilized During Treatment: Gait belt;Rolling walker (2 wheels) Nurse Communication: Mobility status  Activity Tolerance: Patient tolerated treatment well Patient left: in chair;with call bell/phone within reach;with chair alarm set;with family/visitor present  OT Visit Diagnosis: Other abnormalities of gait and mobility (R26.89);Muscle weakness (generalized) (M62.81);Other symptoms and signs involving cognitive function;History of falling (Z91.81);Pain Pain - Right/Left: Right Pain - part of body: Leg                Time: 9528-4132 OT Time Calculation (min): 23 min Charges:  OT General Charges $OT Visit: 1 Visit OT Evaluation $OT Eval Moderate Complexity: 1 Mod  Barry Brunner, OT Acute Rehabilitation Services Office [phone]   Chancy Milroy 01/09/2023, 12:01 PM

## 2023-01-10 DIAGNOSIS — G9340 Encephalopathy, unspecified: Secondary | ICD-10-CM | POA: Diagnosis not present

## 2023-01-10 LAB — GLUCOSE, CAPILLARY
Glucose-Capillary: 185 mg/dL — ABNORMAL HIGH (ref 70–99)
Glucose-Capillary: 254 mg/dL — ABNORMAL HIGH (ref 70–99)
Glucose-Capillary: 222 mg/dL — ABNORMAL HIGH (ref 70–99)
Glucose-Capillary: 118 mg/dL — ABNORMAL HIGH (ref 70–99)

## 2023-01-10 MED ORDER — DULOXETINE HCL 30 MG PO CPEP
30.0000 mg | ORAL_CAPSULE | Freq: Every morning | ORAL | Status: DC
  Administered 2023-01-11 – 2023-01-12 (×2): 30 mg via ORAL
  Filled 2023-01-10 (×2): qty 1

## 2023-01-10 NOTE — Progress Notes (Signed)
PROGRESS NOTE   Fernando Hess  ZOX:[id number] DOB: 02/18/1946 DOA: 01/07/2023 PCP: [name], MD  Brief Narrative:   77 year old Fernando Hess SN[facility] CABG 2017 CKD 3B baseline creatinine ~2--known BPH Myasthenia gravis diagnosed 07/2022?? HFpEF Paroxysmal A-fib CHADVASC >4/Eliquis Depression + anxiety Stroke 09/2022?  Actual diagnosis-MRI was recommended with [facility] neurology  Recent admission 12/24/2022-01/01/2023 acute impacted subcapital R femoral #--status post R hip hemiarthroplasty Dr. Wilford Grist complicated by acute urinary retention requiring coud catheter and was supposed to follow-up in the outpatient with urology for voiding trial  Present 01/07/2023 confusion, slurred speech Neurology consulted felt this was more dementia + polypharmacy >myasthenia (never formally diagnosed) MRI ordered on admission Renal ultrasound ordered given?  Prior renal cyst  Encephalopathy improved status post opiate Seroquel discontinuation Pending SNF placement  Hospital-Problem based course  Toxic metabolic encephalopathy on admission 2/2 polypharmacy Robaxin Seroquel volume depletion Metabolic workup negative--he appears to have been placed back on as needed Seroquel for agitation which I have discontinued a Continue Seroquel 25 at bedtime Cymbalta dose has been dropped to 30 mg, can continue Wellbutrin 1:50 AM continue Tylenol 650 every 6 for pain Outpatient neurology follow-up with [facility] in [place],--Dr. Aletha Halim SLP saw patient today and felt cognitively he is improved-I tend to concur-he remembers date time year however misses the month and the season  CAD HFpEF 60-65%/16 No diuretics-outpatient reeval, was on IVF  AKI on admission baseline creatinine 2 Stop IV fluid check labs a.m.  Paroxysmal A-fib not on rate control CHADS2 score >4 Continue Eliquis 5 twice daily  Hip repair 12/26/2022 Therapy recommended skilled-I have asked him to reassess  him as he should be able to mobilize with Tylenol  DM TY 2 A1c 10 point 7 repeat as outpatient Currently CBGs are 222-254 Continues on Semglee insulin 15, moderate sliding scale and at bedtime Uses a sliding scale at home which she should continue and has freestyle continuous monitor  Mild leukocytosis-no fever Watch tonight  BPH continue Flomax 0.4  DVT prophylaxis: Eliquis Code Status: Full Family Communication: Fernando Hess updated Disposition:  Status is: Inpatient Remains inpatient appropriate because:   Await therapy re-eval  Subjective: Awake coherent and in nad Can make his needs known No cp fever Can orient  Objective: Vitals:   01/10/23 0027 01/10/23 0400 01/10/23 0405 01/10/23 0749  BP: (!) 141/78 (!) 137/92  137/79  Pulse: 93 91  67  Resp: 18 18  18   Temp: 99.9 F (37.7 C) 98.3 F (36.8 C)  98 F (36.7 C)  TempSrc: Oral Oral  Oral  SpO2: 97% 98%  98%  Weight:   84.7 kg   Height:        Intake/Output Summary (Last 24 hours) at 01/10/2023 0801 Last data filed at 01/10/2023 0401 Gross per 24 hour  Intake 1363.74 ml  Output 1375 ml  Net -11.26 ml   Filed Weights   01/07/23 1629 01/10/23 0405  Weight: 68.9 kg 84.7 kg    Examination:  Eomi ncat, smile symm Cta b S1 s2 no m Abd soft nt no rebound ROM --Neuro Power Intact   Data Reviewed: personally reviewed   CBC    Component Value Date/Time   WBC 11.2 (H) 01/09/2023 0653   RBC 4.22 01/09/2023 0653   HGB 12.3 (L) 01/09/2023 0653   HGB 15.8 05/31/2021 1438   HCT 38.2 (L) 01/09/2023 0653   HCT 45.2 05/31/2021 1438   PLT 418 (H) 01/09/2023 0653   PLT 251  05/31/2021 1438   MCV 90.5 01/09/2023 0653   MCV 87 05/31/2021 1438   MCH 29.1 01/09/2023 0653   MCHC 32.2 01/09/2023 0653   RDW 15.3 01/09/2023 0653   RDW 13.2 05/31/2021 1438   LYMPHSABS 2.0 01/07/2023 2338   MONOABS 1.0 01/07/2023 2338   EOSABS 0.3 01/07/2023 2338   BASOSABS 0.1 01/07/2023 2338      Latest Ref Rng & Units  01/09/2023    6:53 AM 01/08/2023    7:35 AM 01/08/2023   12:12 AM  CMP  Glucose 70 - 99 mg/dL 161  096    BUN 8 - 23 mg/dL 13  21    Creatinine 0.45 - 1.24 mg/dL 4.09  8.11    Sodium 914 - 145 mmol/L 138  140  141   Potassium 3.5 - 5.1 mmol/L 4.4  4.1  4.3   Chloride 98 - 111 mmol/L 102  107    CO2 22 - 32 mmol/L 25  26    Calcium 8.9 - 10.3 mg/dL 8.6  8.3    Total Protein 6.5 - 8.1 g/dL  5.1    Total Bilirubin 0.3 - 1.2 mg/dL  1.0    Alkaline Phos 38 - 126 U/L  113    AST 15 - 41 U/L  32    ALT 0 - 44 U/L  32       Radiology Studies: VAS [place] LOWER EXTREMITY VENOUS (DVT)  Result Date: 01/09/2023  Lower Venous DVT Study Patient Name:  Fernando Hess  Date of Exam:   01/08/2023 Medical Rec #: [id number]       Accession #:    [id number] Date of Birth: 02-Apr-1946       Patient Gender: M Patient Age:   77 years Exam Location:  [facility] Procedure:      VAS [place] LOWER EXTREMITY VENOUS (DVT) Referring Phys: Jonny Ruiz DOUTOVA --------------------------------------------------------------------------------  Indications: Swelling.  Limitations: Altered mental status, constant movement of legs, patient attempting to get out of bed. Comparison Study: No prior study on file Performing Technologist: Sherren Kerns RVS  Examination Guidelines: A complete evaluation includes B-mode imaging, spectral Doppler, color Doppler, and power Doppler as needed of all accessible portions of each vessel. Bilateral testing is considered an integral part of a complete examination. Limited examinations for reoccurring indications may be performed as noted. The reflux portion of the exam is performed with the patient in reverse Trendelenburg.  +---------+---------------+---------+-----------+----------+--------------+ RIGHT    CompressibilityPhasicitySpontaneityPropertiesThrombus Aging +---------+---------------+---------+-----------+----------+--------------+ CFV      Full           Yes      Yes                                  +---------+---------------+---------+-----------+----------+--------------+ SFJ      Full                                                        +---------+---------------+---------+-----------+----------+--------------+ FV Prox  Full                                                        +---------+---------------+---------+-----------+----------+--------------+  FV Mid   Full           Yes      Yes                                 +---------+---------------+---------+-----------+----------+--------------+ FV DistalFull                                                        +---------+---------------+---------+-----------+----------+--------------+ PFV      Full                                                        +---------+---------------+---------+-----------+----------+--------------+ POP      Full           Yes      Yes                                 +---------+---------------+---------+-----------+----------+--------------+ PTV      Full                                                        +---------+---------------+---------+-----------+----------+--------------+ PERO     Full                                                        +---------+---------------+---------+-----------+----------+--------------+   +---------+---------------+---------+-----------+----------+--------------+ LEFT     CompressibilityPhasicitySpontaneityPropertiesThrombus Aging +---------+---------------+---------+-----------+----------+--------------+ CFV      Full           Yes      Yes                                 +---------+---------------+---------+-----------+----------+--------------+ SFJ      Full                                                        +---------+---------------+---------+-----------+----------+--------------+ FV Prox  Full                                                         +---------+---------------+---------+-----------+----------+--------------+ FV Mid   Full           Yes      Yes                                 +---------+---------------+---------+-----------+----------+--------------+  FV DistalFull                                                        +---------+---------------+---------+-----------+----------+--------------+ PFV      Full                                                        +---------+---------------+---------+-----------+----------+--------------+ POP      Full           Yes      Yes                                 +---------+---------------+---------+-----------+----------+--------------+ PTV      Full                                                        +---------+---------------+---------+-----------+----------+--------------+ PERO     Full                                                        +---------+---------------+---------+-----------+----------+--------------+     Summary: BILATERAL: - No evidence of deep vein thrombosis seen in the lower extremities, bilaterally. -No evidence of popliteal cyst, bilaterally.   *See table(s) above for measurements and observations. Electronically signed by Gerarda Fraction on 01/09/2023 at 4:33:20 PM.    Final      Scheduled Meds:  acetaminophen  650 mg Oral Q6H WA   amLODipine  5 mg Oral Daily   apixaban  5 mg Oral BID   atorvastatin  80 mg Oral QPM   budesonide  0.25 mg Nebulization BID   buPROPion  150 mg Oral q morning   DULoxetine  60 mg Oral q morning   feeding supplement (GLUCERNA SHAKE)  237 mL Oral TID BM   insulin aspart  0-15 Units Subcutaneous TID WC   insulin aspart  0-5 Units Subcutaneous QHS   insulin glargine-yfgn  15 Units Subcutaneous Daily   multivitamin with minerals  1 tablet Oral Daily   nutrition supplement (JUVEN)  1 packet Oral BID BM   pantoprazole  40 mg Oral q AM   QUEtiapine  25 mg Oral QHS   tamsulosin  0.4 mg Oral QPC  breakfast   Continuous Infusions:  lactated ringers 75 mL/hr at 01/10/23 0026     LOS: 2 days   Time spent: 9  Rhetta Mura, MD [facility] To contact the attending provider between 7A-7P or the covering provider during after hours 7P-7A, please log into the web site [email or web address] and access using universal [place] password for that web site. If you do not have the password, please call the hospital operator.  01/10/2023, 8:01 AM

## 2023-01-10 NOTE — Evaluation (Signed)
Clinical/Bedside Swallow Evaluation Patient Details  Name: Fernando Hess MRN: [id number] Date of Birth: [birth year]  Today's Date: 01/10/2023 Time: SLP Start Time (ACUTE ONLY): 1155 SLP Stop Time (ACUTE ONLY): 1204 SLP Time Calculation (min) (ACUTE ONLY): 9 min  Past Medical History:  Past Medical History:  Diagnosis Date   Acute renal failure (ARF) ([name]) 09/24/2015   Acute stress disorder 11/13/2021   Atrial premature complexes    CAD (coronary artery disease) of artery bypass graft    Early occlusion of saphenous vein graft to intermediate and marginal branch in February 2007 following bypass grafting    CAD (coronary artery disease), native coronary artery 2017   hx NSTEMI 09-24-2015  s/p  CABG x5 on 10-02-2015;  post op STEMI inferolateral wall,  SVG OM1 and SVG OM2 occluded, distal OM occlusion the calpruit, treated medically // Myoview 7/21: no ischemia, EF 65, low risk   CHF (congestive heart failure) ([name])    CKD (chronic kidney disease), stage III ([name])    Contusion of right knee 03/16/2020   Dyspnea    Elevated troponin    Erectile dysfunction    Esophageal reflux    History of atrial fibrillation    post op CABG 10-02-2015   History of kidney stones    History of non-ST elevation myocardial infarction (NSTEMI) 09/24/2015   s/p  CABG x5   History of ST elevation myocardial infarction (STEMI) 10/22/2015   inferior wall,  post op CABG 10-02-2015   Hyperlipidemia    Hypertension    Left ureteral stone    Mild atherosclerosis of both carotid arteries    Nephrolithiasis    per CT bilateral non-obstructive calculi   OSA (obstructive sleep apnea)    Peripheral artery disease    LE Arterial US 01/2019: R PTA and ATA occluded; L ATA occluded   RBBB (right bundle branch block)    Renal atrophy, right    Sleep apnea    wears cpap    ST elevation myocardial infarction (STEMI) of inferior wall ([name]) 10/22/2015   Type 2 diabetes mellitus treated with insulin ([name])    followed  by pcp   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) 03/01/2008   Wears glasses    Past Surgical History:  Past Surgical History:  Procedure Laterality Date   APPENDECTOMY  1965   CARDIAC CATHETERIZATION N/A 09/26/2015   Procedure: Left Heart Cath and Coronary Angiography;  Surgeon: Lennette Bihari, MD;  Location: [facility] INVASIVE CV LAB;  Service: Cardiovascular;  Laterality: N/A;   CARDIAC CATHETERIZATION N/A 10/22/2015   Procedure: Left Heart Cath and Coronary Angiography;  Surgeon: Tonny Bollman, MD;  Location: [facility] INVASIVE CV LAB;  Service: Cardiovascular;  Laterality: N/A;   CATARACT EXTRACTION W/ INTRAOCULAR LENS  IMPLANT, BILATERAL  2017   COLONOSCOPY     CORONARY ARTERY BYPASS GRAFT N/A 10/02/2015   Procedure: CORONARY ARTERY BYPASS GRAFTING (CABG) X5 LIMA-LAD; SVG-DIAG; SVG-OM; SVG-PD; SVG-RAMUS TRANSESOPHAGEAL ECHOCARDIOGRAM (TEE) ENDOSCOPIC GREATER SAPHENOUS VEIN  HARVEST BILAT LE;  Surgeon: Kerin Perna, MD;  Location: [facility] OR;  Service: Open Heart Surgery;  Laterality: N/A;   CYSTOSCOPY/URETEROSCOPY/HOLMIUM LASER/STENT PLACEMENT Left 08/10/2018   Procedure: CYSTOSCOPY/URETEROSCOPY/HOLMIUM LASER/STENT PLACEMENT;  Surgeon: Jerilee Field, MD;  Location: [facility];  Service: Urology;  Laterality: Left;   CYSTOSCOPY/URETEROSCOPY/HOLMIUM LASER/STENT PLACEMENT Left 09/10/2018   Procedure: CYSTOSCOPY/URETEROSCOPY/HOLMIUM LASER/STENT EXCHANGE;  Surgeon: Jerilee Field, MD;  Location: [facility] ORS;  Service: Urology;  Laterality: Left;  HIP ARTHROPLASTY Right 12/26/2022   Procedure: RIGHT HEMI HIP ARTHROPLASTY;  Surgeon: Joen Laura, MD;  Location: [facility] OR;  Service: Orthopedics;  Laterality: Right;   LEFT HEART CATHETERIZATION WITH CORONARY ANGIOGRAM N/A 04/13/2014   Procedure: LEFT HEART CATHETERIZATION WITH CORONARY ANGIOGRAM;  Surgeon: Othella Boyer, MD;  Location: [facility] CATH LAB;  Service:  Cardiovascular;  Laterality: N/A;   LEG SURGERY Right age 56   closed reduction leg fracture   NASAL SEPTOPLASTY W/ TURBINOPLASTY Bilateral 08/30/2021   Procedure: NASAL SEPTOPLASTY WITH BILATERAL INFERIOR TURBINATE REDUCTION;  Surgeon: Osborn Coho, MD;  Location: [facility] OR;  Service: ENT;  Laterality: Bilateral;   POLYPECTOMY     RIGHT HEART CATH N/A 06/06/2021   Procedure: RIGHT HEART CATH;  Surgeon: Dolores Patty, MD;  Location: [facility] INVASIVE CV LAB;  Service: Cardiovascular;  Laterality: N/A;   TEE WITHOUT CARDIOVERSION N/A 10/02/2015   Procedure: TRANSESOPHAGEAL ECHOCARDIOGRAM (TEE);  Surgeon: Kerin Perna, MD;  Location: [facility] OR;  Service: Open Heart Surgery;  Laterality: N/A;   URETEROSCOPY WITH HOLMIUM LASER LITHOTRIPSY Bilateral 2004;  2005  dr grapey  @[facility]    VASECTOMY     HPI:  Pt is a 77 y.o. male who presented from [facility]/07[facility]23 with confusion and slurred speech. Admitted for acute encephalopathy. CT head negative for acute intracranial process. PMH: DM, HTN, HLD, sleep apnea, PAD, CAD s/p quintuple bypass, CKD, CHF, myasthenia gravis, Afib, dementia, recent hip fx now s/p Right hip hemiarthroplasty 12/26/22    Assessment / Plan / Recommendation  Clinical Impression  Pt presents with normal oropharyngeal swallow function.  Oral mechanism exam was normal.  Demonstrated thorough mastication of solids, no oral residue post-swallow, the appearance of a brisk swallow response, no s/s of aspiration. Continue current diet - no f/u needs. SLP will sign off. SLP Visit Diagnosis: Dysphagia, unspecified (R13.10)    Aspiration Risk  No limitations    Diet Recommendation   Regular solids, thin liquids  Medication Administration: Whole meds with liquid    Other  Recommendations Oral Care Recommendations: Oral care BID    Recommendations for follow up therapy are one component of a multi-disciplinary discharge planning process, led by the attending physician.  Recommendations may be  updated based on patient status, additional functional criteria and insurance authorization.  Follow up Recommendations No SLP follow up      Assistance Recommended at Discharge    Functional Status Assessment    Frequency and Duration            Prognosis        Swallow Study   General HPI: Pt is a 77 y.o. male who presented from [facility] 01/07/23 with confusion and slurred speech. Admitted for acute encephalopathy. CT head negative for acute intracranial process. PMH: DM, HTN, HLD, sleep apnea, PAD, CAD s/p quintuple bypass, CKD, CHF, myasthenia gravis, Afib, dementia, recent hip fx now s/p Right hip hemiarthroplasty 12/26/22 Type of Study: Bedside Swallow Evaluation Previous Swallow Assessment: no Diet Prior to this Study: Dysphagia 3 (mechanical soft);Thin liquids (Level 0) Temperature Spikes Noted: No Respiratory Status: Room air History of Recent Intubation: No Behavior/Cognition: Alert;Cooperative;Pleasant mood Oral Cavity Assessment: Within Functional Limits Oral Care Completed by SLP: No Oral Cavity - Dentition: Adequate natural dentition Vision: Functional for self-feeding Self-Feeding Abilities: Able to feed self Patient Positioning: Upright in bed Baseline Vocal Quality: Normal Volitional Cough: Strong Volitional Swallow: Able to elicit    Oral/Motor/Sensory Function Overall Oral Motor/Sensory Function: Within functional limits   Ice  Chips Ice chips: Within functional limits   Thin Liquid Thin Liquid: Within functional limits    Nectar Thick Nectar Thick Liquid: Not tested   Honey Thick Honey Thick Liquid: Not tested   Puree Puree: Within functional limits   Solid     Solid: Within functional limits      Blenda Mounts Laurice 01/10/2023,1:13 PM Marchelle Folks L. Samson Frederic, MA CCC/SLP Clinical Specialist - Acute Care SLP Acute Rehabilitation Services Office number [phone]

## 2023-01-10 NOTE — Plan of Care (Signed)

## 2023-01-10 NOTE — Evaluation (Signed)
Speech Language Pathology Evaluation Patient Details Name: Fernando Hess MRN: [id number] DOB: 06/05/1946 Today's Date: 01/10/2023 Time: 1204-1221 SLP Time Calculation (min) (ACUTE ONLY): 17 min  Problem List:  Patient Active Problem List   Diagnosis Date Noted   Left ankle pain 01/08/2023   Hematuria 01/08/2023   Fracture of femoral neck, right, closed ([name]) 12/24/2022   Depression with anxiety 12/24/2022   Acute metabolic encephalopathy 09/18/2022   Acute encephalopathy 09/17/2022   AKI (acute kidney injury) ([name]) 09/04/2022   Generalized weakness 08/12/2022   Myasthenia gravis ([name]) 04/21/2022   Chronic kidney disease, stage 3b ([name]) 03/21/2022   Posterior vitreous detachment of both eyes 01/23/2022   Constipation 11/13/2021   Diabetes mellitus, type 2 ([name]) 11/13/2021   Severe major depression, single episode, without psychotic features ([name]) 11/13/2021   Amaurosis fugax 11/13/2021   Amnesia 11/13/2021   Anxiety 11/13/2021   Cardiac arrhythmia 11/13/2021   Hearing loss 11/13/2021   Hypercoagulable state ([name]) 11/13/2021   Hyperparathyroidism due to renal insufficiency ([name]) 11/13/2021   Mild aortic stenosis 11/05/2021   Paroxysmal atrial fibrillation ([name]) 11/05/2021   Nasal septal deviation 08/30/2021   Anticoagulated by anticoagulation treatment 07/16/2021   Syncope 05/10/2021   Gastro-esophageal reflux disease without esophagitis 07/25/2020   Globus pharyngeus 07/25/2020   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) 12/21/2019   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   Chronic heart failure with preserved ejection fraction (HFpEF) ([name]) 11/18/2019   OSA on CPAP 03/07/2019   Peripheral artery disease    Intractable vascular headache 11/11/2018   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   Coronary artery disease involving native coronary artery of native heart without angina pectoris 09/24/2015   Moderate nonproliferative diabetic retinopathy  of left eye with macular edema associated with type 2 diabetes mellitus ([name]) 03/01/2008   Hyperlipidemia LDL goal <70    Essential hypertension    History of kidney stones    Past Medical History:  Past Medical History:  Diagnosis Date   Acute renal failure (ARF) ([name]) 09/24/2015   Acute stress disorder 11/13/2021   Atrial premature complexes    CAD (coronary artery disease) of artery bypass graft    Early occlusion of saphenous vein graft to intermediate and marginal branch in February 2007 following bypass grafting    CAD (coronary artery disease), native coronary artery 2017   hx NSTEMI 09-24-2015  s/p  CABG x5 on 10-02-2015;  post op STEMI inferolateral wall,  SVG OM1 and SVG OM2 occluded, distal OM occlusion the calpruit, treated medically // Myoview 7/21: no ischemia, EF 65, low risk   CHF (congestive heart failure) ([name])    CKD (chronic kidney disease), stage III ([name])    Contusion of right knee 03/16/2020   Dyspnea    Elevated troponin    Erectile dysfunction    Esophageal reflux    History of atrial fibrillation    post op CABG 10-02-2015   History of kidney stones    History of non-ST elevation myocardial infarction (NSTEMI) 09/24/2015   s/p  CABG x5   History of ST elevation myocardial infarction (STEMI) 10/22/2015   inferior wall,  post op CABG 10-02-2015   Hyperlipidemia    Hypertension    Left ureteral stone    Mild atherosclerosis of both carotid arteries    Nephrolithiasis    per CT bilateral non-obstructive calculi   OSA (obstructive sleep apnea)    Peripheral artery disease    LE Arterial US 01/2019:  R PTA and ATA occluded; L ATA occluded   RBBB (right bundle branch block)    Renal atrophy, right    Sleep apnea    wears cpap    ST elevation myocardial infarction (STEMI) of inferior wall ([name]) 10/22/2015   Type 2 diabetes mellitus treated with insulin ([name])    followed by pcp   Type 2 diabetes mellitus with moderate nonproliferative diabetic retinopathy  of left eye without macular edema ([name]) 03/01/2008   Wears glasses    Past Surgical History:  Past Surgical History:  Procedure Laterality Date   APPENDECTOMY  1965   CARDIAC CATHETERIZATION N/A 09/26/2015   Procedure: Left Heart Cath and Coronary Angiography;  Surgeon: Lennette Bihari, MD;  Location: [facility] INVASIVE CV LAB;  Service: Cardiovascular;  Laterality: N/A;   CARDIAC CATHETERIZATION N/A 10/22/2015   Procedure: Left Heart Cath and Coronary Angiography;  Surgeon: Tonny Bollman, MD;  Location: [facility] INVASIVE CV LAB;  Service: Cardiovascular;  Laterality: N/A;   CATARACT EXTRACTION W/ INTRAOCULAR LENS  IMPLANT, BILATERAL  2017   COLONOSCOPY     CORONARY ARTERY BYPASS GRAFT N/A 10/02/2015   Procedure: CORONARY ARTERY BYPASS GRAFTING (CABG) X5 LIMA-LAD; SVG-DIAG; SVG-OM; SVG-PD; SVG-RAMUS TRANSESOPHAGEAL ECHOCARDIOGRAM (TEE) ENDOSCOPIC GREATER SAPHENOUS VEIN  HARVEST BILAT LE;  Surgeon: Kerin Perna, MD;  Location: [facility] OR;  Service: Open Heart Surgery;  Laterality: N/A;   CYSTOSCOPY/URETEROSCOPY/HOLMIUM LASER/STENT PLACEMENT Left 08/10/2018   Procedure: CYSTOSCOPY/URETEROSCOPY/HOLMIUM LASER/STENT PLACEMENT;  Surgeon: Jerilee Field, MD;  Location: [facility];  Service: Urology;  Laterality: Left;   CYSTOSCOPY/URETEROSCOPY/HOLMIUM LASER/STENT PLACEMENT Left 09/10/2018   Procedure: CYSTOSCOPY/URETEROSCOPY/HOLMIUM LASER/STENT EXCHANGE;  Surgeon: Jerilee Field, MD;  Location: [facility] ORS;  Service: Urology;  Laterality: Left;   HIP ARTHROPLASTY Right 12/26/2022   Procedure: RIGHT HEMI HIP ARTHROPLASTY;  Surgeon: Joen Laura, MD;  Location: [facility] OR;  Service: Orthopedics;  Laterality: Right;   LEFT HEART CATHETERIZATION WITH CORONARY ANGIOGRAM N/A 04/13/2014   Procedure: LEFT HEART CATHETERIZATION WITH CORONARY ANGIOGRAM;  Surgeon: Othella Boyer, MD;  Location: [facility] CATH LAB;  Service: Cardiovascular;  Laterality: N/A;   LEG SURGERY Right age 1   closed reduction leg fracture    NASAL SEPTOPLASTY W/ TURBINOPLASTY Bilateral 08/30/2021   Procedure: NASAL SEPTOPLASTY WITH BILATERAL INFERIOR TURBINATE REDUCTION;  Surgeon: Osborn Coho, MD;  Location: [facility] OR;  Service: ENT;  Laterality: Bilateral;   POLYPECTOMY     RIGHT HEART CATH N/A 06/06/2021   Procedure: RIGHT HEART CATH;  Surgeon: Dolores Patty, MD;  Location: [facility] INVASIVE CV LAB;  Service: Cardiovascular;  Laterality: N/A;   TEE WITHOUT CARDIOVERSION N/A 10/02/2015   Procedure: TRANSESOPHAGEAL ECHOCARDIOGRAM (TEE);  Surgeon: Kerin Perna, MD;  Location: [facility] OR;  Service: Open Heart Surgery;  Laterality: N/A;   URETEROSCOPY WITH HOLMIUM LASER LITHOTRIPSY Bilateral 2004;  2005  dr Isabel Caprice  @[facility]    VASECTOMY     HPI:  Pt is a 77 y.o. male who presented from [facility] 01/07/23 with confusion and slurred speech. Admitted for acute encephalopathy due likely to polypharmacy. CT head negative for acute intracranial process. PMH: DM, HTN, HLD, sleep apnea, PAD, CAD s/p quintuple bypass, CKD, CHF, myasthenia gravis, Afib, dementia, recent hip fx now s/p Right hip hemiarthroplasty 12/26/22   Assessment / Plan / Recommendation Clinical Impression  Pt presents with improved cognition since admission.  Today his recall of recent events is improved - he has difficulty remembering timeline/chronology but remembers, for example, that he had his hip surgery weeks ago and  is able to state where he was living prior to this admission (improved from yesterday).  Speech is fluent, rapid but intelligible. Sustained and selective attention [facility] for verbal tasks. Expressive and receptive language [facility]; improved ability to follow commands; yes/no reliability WFL. Judgment and awareness remain compromised.  Recommend that pt resume SLP f/u for cognition in long-term care setting.  No f/u for swallowing.  Acute care SLP will sign off - pt for likely D/C back to SNF on Monday.    SLP Assessment  SLP Recommendation/Assessment: All further Speech  [place] Pathology  needs can be addressed in the next venue of care SLP Visit Diagnosis: Cognitive communication deficit (R41.841)    Recommendations for follow up therapy are one component of a multi-disciplinary discharge planning process, led by the attending physician.  Recommendations may be updated based on patient status, additional functional criteria and insurance authorization.    Follow Up Recommendations  Skilled nursing-short term rehab (<3 hours/day)    Assistance Recommended at Discharge  Frequent or constant Supervision/Assistance                 SLP Evaluation Cognition  Overall Cognitive Status: No family/caregiver present to determine baseline cognitive functioning Arousal/Alertness: Awake/alert Orientation Level: Oriented to person;Oriented to place;Oriented to situation;Disoriented to time Attention: Selective Selective Attention: Appears intact Memory: Impaired Memory Impairment: Retrieval deficit Awareness: Impaired Awareness Impairment: Intellectual impairment Safety/Judgment: Impaired       Comprehension  Auditory Comprehension Overall Auditory Comprehension: Appears within functional limits for tasks assessed Commands: Within Functional Limits Conversation: Simple Visual Recognition/Discrimination Discrimination: Within Function Limits Reading Comprehension Reading Status: Not tested    Expression Expression Primary Mode of Expression: Verbal Verbal Expression Initiation: No impairment Automatic Speech: Name;Social Response;Counting Level of Generative/Spontaneous Verbalization: Conversation Repetition: No impairment Naming: No impairment Written Expression Dominant Hand: Right   Oral / Motor  Oral Motor/Sensory Function Overall Oral Motor/Sensory Function: Within functional limits Motor Speech Overall Motor Speech: Appears within functional limits for tasks assessed            Blenda Mounts Laurice 01/10/2023, 1:30 PM Marchelle Folks L.  Samson Frederic, MA CCC/SLP Clinical Specialist - Acute Care SLP Acute Rehabilitation Services Office number [phone]

## 2023-01-11 DIAGNOSIS — G9340 Encephalopathy, unspecified: Secondary | ICD-10-CM | POA: Diagnosis not present

## 2023-01-11 LAB — CBC WITH DIFFERENTIAL/PLATELET
Abs Immature Granulocytes: 0.08 10*3/uL — ABNORMAL HIGH (ref 0.00–0.07)
Basophils Absolute: 0.1 10*3/uL (ref 0.0–0.1)
Basophils Relative: 2 %
Eosinophils Absolute: 0.3 10*3/uL (ref 0.0–0.5)
Eosinophils Relative: 5 %
HCT: 35.4 % — ABNORMAL LOW (ref 39.0–52.0)
Hemoglobin: 11.4 g/dL — ABNORMAL LOW (ref 13.0–17.0)
Immature Granulocytes: 1 %
Lymphocytes Relative: 23 %
Lymphs Abs: 1.6 10*3/uL (ref 0.7–4.0)
MCH: 29.1 pg (ref 26.0–34.0)
MCHC: 32.2 g/dL (ref 30.0–36.0)
MCV: 90.3 fL (ref 80.0–100.0)
Monocytes Absolute: 0.7 10*3/uL (ref 0.1–1.0)
Monocytes Relative: 11 %
Neutro Abs: 4 10*3/uL (ref 1.7–7.7)
Neutrophils Relative %: 58 %
Platelets: 372 10*3/uL (ref 150–400)
RBC: 3.92 MIL/uL — ABNORMAL LOW (ref 4.22–5.81)
RDW: 15.3 % (ref 11.5–15.5)
WBC: 6.8 10*3/uL (ref 4.0–10.5)
nRBC: 0 % (ref 0.0–0.2)

## 2023-01-11 LAB — BASIC METABOLIC PANEL
Anion gap: 9 (ref 5–15)
BUN: 17 mg/dL (ref 8–23)
CO2: 25 mmol/L (ref 22–32)
Calcium: 8.5 mg/dL — ABNORMAL LOW (ref 8.9–10.3)
Chloride: 105 mmol/L (ref 98–111)
Creatinine, Ser: 1.54 mg/dL — ABNORMAL HIGH (ref 0.61–1.24)
GFR, Estimated: 46 mL/min — ABNORMAL LOW (ref >60)
Glucose, Bld: 193 mg/dL — ABNORMAL HIGH (ref 70–99)
Potassium: 4.1 mmol/L (ref 3.5–5.1)
Sodium: 139 mmol/L (ref 135–145)

## 2023-01-11 LAB — GLUCOSE, CAPILLARY
Glucose-Capillary: 133 mg/dL — ABNORMAL HIGH (ref 70–99)
Glucose-Capillary: 319 mg/dL — ABNORMAL HIGH (ref 70–99)
Glucose-Capillary: 254 mg/dL — ABNORMAL HIGH (ref 70–99)
Glucose-Capillary: 129 mg/dL — ABNORMAL HIGH (ref 70–99)

## 2023-01-11 MED ORDER — INSULIN GLARGINE-YFGN 100 UNIT/ML ~~LOC~~ SOLN
18.0000 [IU] | Freq: Every day | SUBCUTANEOUS | Status: DC
  Administered 2023-01-12: 18 [IU] via SUBCUTANEOUS
  Filled 2023-01-11: qty 0.18

## 2023-01-11 NOTE — Plan of Care (Signed)

## 2023-01-11 NOTE — Progress Notes (Addendum)
PROGRESS NOTE   Fernando Hess  OZH:[id number] DOB: Jan 31, 1946 DOA: 01/07/2023 PCP: [name], MD  Brief Narrative:   77 year old Fernando Hess SNF CABG 2017 CKD 3B baseline creatinine ~2--known BPH Myasthenia gravis diagnosed 07/2022?? HFpEF Paroxysmal A-fib CHADVASC >4/Eliquis Depression + anxiety Stroke 09/2022?  Actual diagnosis-MRI was recommended with [facility] neurology  Recent admission 12/24/2022-01/01/2023 acute impacted subcapital R femoral #--status post R hip hemiarthroplasty Dr. Wilford Grist complicated by acute urinary retention requiring coud catheter and was supposed to follow-up in the outpatient with urology for voiding trial  Present 01/07/2023 confusion, slurred speech Neurology consulted felt this was more dementia + polypharmacy >myasthenia (never formally diagnosed) MRI ordered on admission but then promptly discontinued as he started to improve Renal ultrasound ordered given?  Prior renal cyst  Encephalopathy improved status post opiate Seroquel discontinuation Pending SNF placement  Hospital-Problem based course  Toxic metabolic encephalopathy on admission 2/2 polypharmacy Robaxin Seroquel volume depletion Metabolic workup negative--was on prn Seroquel for agitation which I have d/c'd Continue Seroquel 25  hs, Cymbalta dose dropped to 30 mg, continue Wellbutrin 150 AM continue Tylenol 650 every 6 for pain Outpatient neurology follow-up with [facility] in [place]---Dr. Aletha Halim SLP evaluated 4/27 and noted objective improvements to his cognition  CAD HFpEF 60-65%/16 No diuretics-outpatient reeval, was on IVF   AKI on admission baseline creatinine 2 Labs improved form admit, as needed repeat-do not suspect will need further labs this admission  Paroxysmal A-fib not on rate control CHADS2 score >4 Continue Eliquis 5 twice daily  Hip repair 12/26/2022 Therapy recommended skilled-he should be able to mobilize with  Tylenol  DM TY 2 A1c 10 point 7 repeat as outpatient Currently CBGs are 130-320 increase Semglee insulin  to 18, moderate sliding scale and at bedtime Uses a sliding scale at home + has freestyle continuous monitor  Mild leukocytosis-no fever Watch for fever  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 Annual imaging CTA/MRA as per radiology/PCP as outpatient  BPH  Likely benign renal cyst continue Flomax 0.4  DVT prophylaxis: Eliquis Code Status: Full Family Communication: Dionte Blaustein updated 4/28 Disposition:  Status is: Inpatient Remains inpatient appropriate because:   Await therapy re-eval  Subjective:  Awake pleasant sitting in the chair no distress looks comfortable eating drinking and oriented to time place person president as well as current circumstances in the hospital Reviewed therapies note from 4/28-is quite functional but still requires intensive therapy so will probably be best served by going to skilled  Objective: Vitals:   01/11/23 0500 01/11/23 0731 01/11/23 0804 01/11/23 1121  BP:  (!) 165/89  119/67  Pulse:  (!) 54  94  Resp:  18  18  Temp:  98.4 F (36.9 C)  98.1 F (36.7 C)  TempSrc:  Oral  Oral  SpO2:  94% 94% 95%  Weight: 85.2 kg     Height:        Intake/Output Summary (Last 24 hours) at 01/11/2023 1435 Last data filed at 01/10/2023 1824 Gross per 24 hour  Intake 966.47 ml  Output 900 ml  Net 66.47 ml    Filed Weights   01/07/23 1629 01/10/23 0405 01/11/23 0500  Weight: 68.9 kg 84.7 kg 85.2 kg    Examination:  Eomi ncat, smile symm Cta b no added sound no rales rhonchi S1 s2 no m Abd soft nt no rebound-slightly obese no pain on pressure ROM --Neuro Power Intact Psych euthymic coherent   Data Reviewed: personally reviewed  CBC    Component Value Date/Time   WBC 6.8 01/11/2023 0745   RBC 3.92 (L) 01/11/2023 0745   HGB 11.4 (L) 01/11/2023 0745   HGB 15.8 05/31/2021 1438   HCT 35.4 (L) 01/11/2023 0745   HCT 45.2  05/31/2021 1438   PLT 372 01/11/2023 0745   PLT 251 05/31/2021 1438   MCV 90.3 01/11/2023 0745   MCV 87 05/31/2021 1438   MCH 29.1 01/11/2023 0745   MCHC 32.2 01/11/2023 0745   RDW 15.3 01/11/2023 0745   RDW 13.2 05/31/2021 1438   LYMPHSABS 1.6 01/11/2023 0745   MONOABS 0.7 01/11/2023 0745   EOSABS 0.3 01/11/2023 0745   BASOSABS 0.1 01/11/2023 0745      Latest Ref Rng & Units 01/11/2023    7:45 AM 01/09/2023    6:53 AM 01/08/2023    7:35 AM  CMP  Glucose 70 - 99 mg/dL 409  811  914   BUN 8 - 23 mg/dL 17  13  21    Creatinine 0.61 - 1.24 mg/dL 7.82  9.56  2.13   Sodium 135 - 145 mmol/L 139  138  140   Potassium 3.5 - 5.1 mmol/L 4.1  4.4  4.1   Chloride 98 - 111 mmol/L 105  102  107   CO2 22 - 32 mmol/L 25  25  26    Calcium 8.9 - 10.3 mg/dL 8.5  8.6  8.3   Total Protein 6.5 - 8.1 g/dL   5.1   Total Bilirubin 0.3 - 1.2 mg/dL   1.0   Alkaline Phos 38 - 126 U/L   113   AST 15 - 41 U/L   32   ALT 0 - 44 U/L   32      Radiology Studies: No results found.   Scheduled Meds:  acetaminophen  650 mg Oral Q6H WA   amLODipine  5 mg Oral Daily   apixaban  5 mg Oral BID   atorvastatin  80 mg Oral QPM   budesonide  0.25 mg Nebulization BID   buPROPion  150 mg Oral q morning   DULoxetine  30 mg Oral q morning   feeding supplement (GLUCERNA SHAKE)  237 mL Oral TID BM   insulin aspart  0-15 Units Subcutaneous TID WC   insulin aspart  0-5 Units Subcutaneous QHS   [START ON 01/12/2023] insulin glargine-yfgn  18 Units Subcutaneous Daily   multivitamin with minerals  1 tablet Oral Daily   nutrition supplement (JUVEN)  1 packet Oral BID BM   pantoprazole  40 mg Oral q AM   QUEtiapine  25 mg Oral QHS   tamsulosin  0.4 mg Oral QPC breakfast   Continuous Infusions:     LOS: 3 days   Time spent: 40  Rhetta Mura, MD [facility] To contact the attending provider between 7A-7P or the covering provider during after hours 7P-7A, please log into the web site [email or web address]  and access using universal [place] password for that web site. If you do not have the password, please call the hospital operator.  01/11/2023, 2:35 PM

## 2023-01-11 NOTE — Progress Notes (Signed)
Physical Therapy Treatment Patient Details Name: Fernando Hess MRN: [id number] DOB: 20-Mar-1946 Today's Date: 01/11/2023   History of Present Illness Pt is a 77 y.o. male who presented from [facility] 01/07/23 with confusion and slurred speech. Admitted for acute encephalopathy. CT head negative for acute intracranial process. PMH: DM, HTN, HLD, sleep apnea, PAD, CAD s/p quintuple bypass, CKD, CHF, myasthenia gravis, Afib, dementia, recent hip fx now s/p Right hip hemiarthroplasty 12/26/22    PT Comments    MD requesting re-assessment of pt due to wife reporting pt's mentation has improved and is inquiring whether pt could d/c home or not. Pt's cognition and functional status are improving, but he is still a high fall risk, requiring minA for transfers and to ambulate up to ~35 ft with a RW before pt fatigues. Pt's wife uses a walker and pt denies any other assistance available at home at this time. He currently requires physical assistance with all standing mobility and due to the lack of this assistance being available at d/c, still feel short-term inpatient rehab would be more optimal and safer for pt to promote independence and safety with all functional mobility prior to return home. Will continue to follow acutely.     Recommendations for follow up therapy are one component of a multi-disciplinary discharge planning process, led by the attending physician.  Recommendations may be updated based on patient status, additional functional criteria and insurance authorization.  Follow Up Recommendations  Can patient physically be transported by private vehicle: No    Assistance Recommended at Discharge Frequent or constant Supervision/Assistance  Patient can return home with the following Assistance with cooking/housework;Assist for transportation;Help with stairs or ramp for entrance;A lot of help with bathing/dressing/bathroom;Direct supervision/assist for medications management;Direct  supervision/assist for financial management;A lot of help with walking and/or transfers   Equipment Recommendations  Other (comment) (defer to next venue of care)    Recommendations for Other Services       Precautions / Restrictions Precautions Precautions: Fall Restrictions Weight Bearing Restrictions: Yes RLE Weight Bearing: Weight bearing as tolerated Other Position/Activity Restrictions: no formal hip precautions     Mobility  Bed Mobility Overal bed mobility: Needs Assistance Bed Mobility: Supine to Sit     Supine to sit: Min guard, HOB elevated     General bed mobility comments: HOB elevated with rail given min guard for safety    Transfers Overall transfer level: Needs assistance Equipment used: Rolling walker (2 wheels) Transfers: Sit to/from Stand Sit to Stand: Min assist           General transfer comment: Pt initially trying to pull up with hands on RW, needing light cuing to place 1 hand on EOB to push up to stand, minA for balance as pt is tremulous and swaying posteriorly upon transferring to stand.    Ambulation/Gait Ambulation/Gait assistance: Min assist Gait Distance (Feet): 35 Feet Assistive device: Rolling walker (2 wheels) Gait Pattern/deviations: Decreased step length - right, Decreased step length - left, Decreased stride length, Narrow base of support, Trunk flexed, Step-through pattern Gait velocity: reduced Gait velocity interpretation: <1.31 ft/sec, indicative of household ambulator   General Gait Details: Pt with less ataxic movements but continues to display a very narrow BOS, placing bil feet together with each step until provided verbal and tactile cues to correct, mod momentary success noted. MinA for balance throughout. Pt gets stuck and has difficulty figuring out how to manage his RW around obstacels, needing mod cues to direct him  Stairs             Wheelchair Mobility    Modified Rankin (Stroke Patients  Only) Modified Rankin (Stroke Patients Only) Pre-Morbid Rankin Score: Moderately severe disability Modified Rankin: Moderately severe disability     Balance Overall balance assessment: Needs assistance Sitting-balance support: Feet supported, No upper extremity supported Sitting balance-Leahy Scale: Fair Sitting balance - Comments: min guard statically   Standing balance support: Bilateral upper extremity supported, During functional activity Standing balance-Leahy Scale: Poor Standing balance comment: relies on BUE and external support, posterior bias initially                            Cognition Arousal/Alertness: Awake/alert Behavior During Therapy: WFL for tasks assessed/performed Overall Cognitive Status: No family/caregiver present to determine baseline cognitive functioning Area of Impairment: Attention, Memory, Following commands, Safety/judgement, Problem solving, Awareness                   Current Attention Level: Selective Memory: Decreased short-term memory Following Commands: Follows one step commands consistently, Follows one step commands with increased time, Follows multi-step commands inconsistently Safety/Judgement: Decreased awareness of safety, Decreased awareness of deficits Awareness: Emergent Problem Solving: Slow processing, Requires verbal cues General Comments: Pt needing prompting to recall events leading up to admission, reporting initially he was here due to his hip, but then was able to recall the surgery, d/c to rehab, and back to hospital, but believes he had a stroke. Pt reporting his wife in [facility] right now, but this PT saw her Friday and he did not recall that visit. Pt is aware he is not physically safe with functional mobility. Pt gets stuck and has difficulty figuring out how to manage his RW around obstacels, needing mod cues to direct him        Exercises      General Comments General comments (skin integrity, edema,  etc.): pt reports no one can physically assist him at home; SpO2 93% on RA at rest, 94% on RA immediately following ambulating, unable to get measurement while ambulating      Pertinent Vitals/Pain Pain Assessment Pain Assessment: Faces Faces Pain Scale: Hurts a little bit Pain Location: Rt hip Pain Descriptors / Indicators: Discomfort Pain Intervention(s): Limited activity within patient's tolerance, Monitored during session, Repositioned    Home Living                          Prior Function            PT Goals (current goals can now be found in the care plan section) Acute Rehab PT Goals Patient Stated Goal: to improve PT Goal Formulation: With patient Time For Goal Achievement: 01/23/23 Potential to Achieve Goals: Fair Progress towards PT goals: Progressing toward goals    Frequency    Min 3X/week      PT Plan Current plan remains appropriate    Co-evaluation              AM-PAC PT "6 Clicks" Mobility   Outcome Measure  Help needed turning from your back to your side while in a flat bed without using bedrails?: A Little Help needed moving from lying on your back to sitting on the side of a flat bed without using bedrails?: A Little Help needed moving to and from a bed to a chair (including a wheelchair)?: A Little Help needed standing up from a chair  using your arms (e.g., wheelchair or bedside chair)?: A Little Help needed to walk in hospital room?: A Lot (mod cues) Help needed climbing 3-5 steps with a railing? : Total 6 Click Score: 15    End of Session Equipment Utilized During Treatment: Gait belt Activity Tolerance: Patient tolerated treatment well Patient left: in chair;with call bell/phone within reach;with chair alarm set Nurse Communication: Mobility status (& NT) PT Visit Diagnosis: Unsteadiness on feet (R26.81);Repeated falls (R29.6);Muscle weakness (generalized) (M62.81);History of falling (Z91.81);Difficulty in walking, not  elsewhere classified (R26.2);Other symptoms and signs involving the nervous system (R29.898);Pain;Other abnormalities of gait and mobility (R26.89) Pain - Right/Left: Right Pain - part of body: Hip     Time: 1610-9604 PT Time Calculation (min) (ACUTE ONLY): 18 min  Charges:  $Gait Training: 8-22 mins                     Raymond Gurney, PT, DPT Acute Rehabilitation Services  Office: [phone]    Jewel Baize 01/11/2023, 8:48 AM

## 2023-01-12 DIAGNOSIS — G9340 Encephalopathy, unspecified: Secondary | ICD-10-CM | POA: Diagnosis not present

## 2023-01-12 LAB — GLUCOSE, CAPILLARY
Glucose-Capillary: 209 mg/dL — ABNORMAL HIGH (ref 70–99)
Glucose-Capillary: 291 mg/dL — ABNORMAL HIGH (ref 70–99)

## 2023-01-12 MED ORDER — HYDRALAZINE HCL 25 MG PO TABS: 25.0000 mg | ORAL_TABLET | Freq: Four times a day (QID) | ORAL | 0 refills | Status: DC | PRN

## 2023-01-12 MED ORDER — INSULIN GLARGINE-YFGN 100 UNIT/ML ~~LOC~~ SOLN: 20.0000 [IU] | Freq: Every day | SUBCUTANEOUS | 11 refills | Status: DC

## 2023-01-12 MED ORDER — DULOXETINE HCL 30 MG PO CPEP: 30.0000 mg | ORAL_CAPSULE | Freq: Every morning | ORAL | 0 refills | Status: DC

## 2023-01-12 NOTE — Care Management Important Message (Signed)
Important Message  Patient Details  Name: Fernando Hess MRN: [id number] Date of Birth: 05-12-1946   Medicare Important Message Given:        Dorena Bodo 01/12/2023, 3:23 PM

## 2023-01-12 NOTE — Progress Notes (Signed)
Attempted to call report to [facility] multiple times, but receiving RN unavailable. Receptionist took my name/# and stated she will have the RN call me if she needed any info.

## 2023-01-12 NOTE — Discharge Summary (Signed)
Physician Discharge Summary  Fernando Hess WJX:[id number] DOB: June 18, 1946 DOA: 01/07/2023  PCP: [name], MD  Admit date: 01/07/2023 Discharge date: 01/12/2023  Time spent: 40 minutes  Recommendations for Outpatient Follow-up:  Needs Chem-12 CBC about 1 week at skilled facility Please have the patient follow-up with [facility] in [place]---Dr. Aletha Halim who diagnosed her with MS-it does not seem that the inpatient neurologist felt that this was the diagnosis Recommend de-escalation of polypharmacy and skilled management at skilled facility  Discharge Diagnoses:  MAIN problem for hospitalization   Toxic metabolic encephalopathy on admission CAD HFpEF  poorly controlled diabetes with A1c of 10.7 Paroxysmal A-fib not on prior to admission rate control on Eliquis twice daily  Please see below for itemized issues addressed in HOpsital- refer to other progress notes for clarity if needed  Discharge Condition: Improved  Diet recommendation: Heart healthy diabetic  Filed Weights   01/10/23 0405 01/11/23 0500 01/12/23 0744  Weight: 84.7 kg 85.2 kg 48.33 kg    77 year old Fernando Hess [facility] CABG 2017 CKD 3B baseline creatinine ~2--known BPH HFpEF Paroxysmal A-fib CHADVASC >4/Eliquis Depression + anxiety Stroke 09/2022?  Unconfirmed diagnosis of myasthenia gravis  07/2022?? Unclear of the actual diagnosis-MRI was recommended with [facility] neurology she has been seen by Shawnie Dapper, neurology NP in the outpatient setting mainly for OSA   Recent admission 12/24/2022-01/01/2023 acute impacted subcapital R femoral #--status post R hip hemiarthroplasty Dr. Wilford Grist complicated by acute urinary retention requiring coud catheter and was supposed to follow-up in the outpatient with urology for voiding trial   Present 01/07/2023 confusion, slurred speech Neurology consulted felt this was more dementia + polypharmacy >myasthenia (never formally diagnosed)--does  follow-up with a neurologist in the outpatient setting MRI ordered on admission but then promptly discontinued as he started to improve Renal ultrasound ordered given?  Prior renal cyst   Encephalopathy improved status post opiate Seroquel discontinuation Stabilized during hospitalization for skilled care   Hospital-Problem based course   Toxic metabolic encephalopathy on admission 2/2  polypharmacy + Robaxin + Seroquel + AKI with volume depletion Metabolic workup negative--was on prn Seroquel for agitation which I have d/c'd Meds adjusted as per [facility] below Outpatient neurology follow-up with [facility] in [place]---Dr. Aletha Halim who was the one that apparently diagnosed him with the MS and this will need to be reevaluated in the outpatient setting SLP evaluated 4/27 cleared the patient for regular diet with thin liquids and hold meds   CAD HFpEF 60-65%/16 No diuretics-outpatient reeval, was on IVF    AKI on admission baseline creatinine 2 Labs improved form admit, as needed repeat- repeat the in about 1 to 2 weeks in the outpatient setting Creatinine at last lab draw was 1.5 with a GFR of 46  Paroxysmal A-fib not on rate control CHADS2 score >4 Continue Eliquis 5 twice daily Monitor for tachyarrhythmias in the outpatient setting but is stable and does not require rate control as monitors are benign   Hip repair 12/26/2022 Therapy recommended skilled-he should be able to mobilize with Tylenol   DM TY 2 A1c 10.7 repeat as outpatient CBGs moderately well-controlled increase Semglee insulin  to 18, moderate sliding scale and at bedtime Continuing prior to admission sliding scale at discharge + has freestyle continuous monitor   Mild leukocytosis-no fever Resolved on last labs on 4/28 outpatient follow-up   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 Annual imaging CTA/MRA as per radiology/PCP as outpatient   BPH  Likely  benign renal cyst-can  characterize as outpatient if needed continue Flomax 0.4   Discharge Exam: Vitals:   01/12/23 0753 01/12/23 0831  BP: (!) 152/86   Pulse: 92   Resp: 20   Temp: 98.3 F (36.8 C)   SpO2: 94% 94%    Subj on day of d/c   Awake coherent no distress moving all 4 limbs equally sitting out in chair no spontaneous complaint asking if he can discharge  General Exam on discharge  EOMI NCAT no icterus no pallor no focal deficit No wheeze No rales no rhonchi Abdomen is soft No breakdown Neurologically intact moving 4 limbs equally  Discharge Instructions   Discharge Instructions     Diet - low sodium heart healthy   Complete by: As directed    Increase activity slowly   Complete by: As directed    No wound care   Complete by: As directed       Allergies as of 01/12/2023       Reactions   Cilostazol Swelling, Other (See Comments)   Edema   Dulaglutide Nausea And Vomiting, Other (See Comments)   TRULICITY   Levofloxacin Hives, Itching, Rash   Liraglutide Other (See Comments)   Severe fatigue & insomnia   Lisinopril Itching, Rash, Cough        Medication List     STOP taking these medications    Basaglar KwikPen 100 UNIT/ML   oxyCODONE 5 MG immediate release tablet Commonly known as: Roxicodone   QUEtiapine 50 MG tablet Commonly known as: SEROQUEL       TAKE these medications    acetaminophen 500 MG tablet Commonly known as: TYLENOL Take 2 tablets (1,000 mg total) by mouth every 8 (eight) hours as needed.   apixaban 5 MG Tabs tablet Commonly known as: ELIQUIS Take 1 tablet (5 mg total) by mouth 2 (two) times daily.   atorvastatin 80 MG tablet Commonly known as: LIPITOR TAKE ONE TABLET BY MOUTH EVERY EVENING What changed: when to take this   B-D UF III MINI PEN NEEDLES 31G X 5 MM Misc Generic drug: Insulin Pen Needle USE AS DIRECTED WITH LANTUS SOLOSTAR   budesonide 0.25 MG/2ML nebulizer solution Commonly known as: PULMICORT Take 2 mLs (0.25  mg total) by nebulization 2 (two) times daily.   buPROPion 150 MG 24 hr tablet Commonly known as: WELLBUTRIN XL Take 150 mg by mouth every morning.   DULoxetine 30 MG capsule Commonly known as: CYMBALTA Take 1 capsule (30 mg total) by mouth every morning. Start taking on: January 13, 2023 What changed: how much to take   feeding supplement (GLUCERNA SHAKE) Liqd Take 237 mLs by mouth 3 (three) times daily between meals.   FreeStyle Libre 2 Sensor Misc Inject 1 Device into the skin every 14 (fourteen) days.   hydrALAZINE 25 MG tablet Commonly known as: APRESOLINE Take 1 tablet (25 mg total) by mouth every 6 (six) hours as needed (SBP>160 or DBP>110).   insulin glargine-yfgn 100 UNIT/ML injection Commonly known as: SEMGLEE Inject 0.2 mLs (20 Units total) into the skin daily. Start taking on: January 13, 2023   insulin lispro 100 UNIT/ML injection Commonly known as: HUMALOG Inject 0-14 Units into the skin in the morning, at noon, in the evening, and at bedtime. 201-250= 2 units 251-300= 4 units, 301-350= 6 units, 351-400= 8 units, 401-450 = 10 units, 451-500= 12 units. If >500 or high, give 14 units.   MAGNESIUM PO Take 400 mg by mouth daily.  nitroGLYCERIN 0.4 MG SL tablet Commonly known as: NITROSTAT Place 0.4 mg under the tongue every 5 (five) minutes as needed for chest pain.   nystatin powder Commonly known as: MYCOSTATIN/NYSTOP Apply 1 Application topically 2 (two) times daily as needed (skin irritation). Apply to the groin in the morning and evening What changed: Another medication with the same name was removed. Continue taking this medication, and follow the directions you see here.   OXYGEN Inhale 2 L into the lungs See admin instructions. Inhale 2 L every shift for shortness of breath   pantoprazole 40 MG tablet Commonly known as: PROTONIX TAKE 1 TABLET BY MOUTH EVERY DAY What changed: when to take this   polyethylene glycol 17 g packet Commonly known as:  MIRALAX / GLYCOLAX Take 17 g by mouth 2 (two) times daily as needed for mild constipation. What changed: when to take this   SENIOR MULTIVITAMIN PLUS PO Take 1 tablet by mouth daily with breakfast.   senna-docusate 8.6-50 MG tablet Commonly known as: Senokot-S Take 1 tablet by mouth 2 (two) times daily as needed for moderate constipation. What changed:  when to take this reasons to take this   tamsulosin 0.4 MG Caps capsule Commonly known as: FLOMAX Take 1 capsule (0.4 mg total) by mouth daily after breakfast. What changed: when to take this       Allergies  Allergen Reactions   Cilostazol Swelling and Other (See Comments)    Edema    Dulaglutide Nausea And Vomiting and Other (See Comments)    TRULICITY   Levofloxacin Hives, Itching and Rash   Liraglutide Other (See Comments)    Severe fatigue & insomnia   Lisinopril Itching, Rash and Cough    Contact information for after-discharge care     Destination     [facility] .   Service: Skilled Nursing Contact information: [address] [place] [address] [phone]                      The results of significant diagnostics from this hospitalization (including imaging, microbiology, ancillary and laboratory) are listed below for reference.    Significant Diagnostic Studies: VAS [place] LOWER EXTREMITY VENOUS (DVT)  Result Date: 01/09/2023  Lower Venous DVT Study Patient Name:  Fernando Hess  Date of Exam:   01/08/2023 Medical Rec #: [id number]       Accession #:    [id number] Date of Birth: November 28, 1945       Patient Gender: M Patient Age:   89 years Exam Location:  [facility] Procedure:      VAS [place] LOWER EXTREMITY VENOUS (DVT) Referring Phys: Jonny Ruiz DOUTOVA --------------------------------------------------------------------------------  Indications: Swelling.  Limitations: Altered mental status, constant movement of legs, patient attempting to get out of  bed. Comparison Study: No prior study on file Performing Technologist: Sherren Kerns RVS  Examination Guidelines: A complete evaluation includes B-mode imaging, spectral Doppler, color Doppler, and power Doppler as needed of all accessible portions of each vessel. Bilateral testing is considered an integral part of a complete examination. Limited examinations for reoccurring indications may be performed as noted. The reflux portion of the exam is performed with the patient in reverse Trendelenburg.  +---------+---------------+---------+-----------+----------+--------------+ RIGHT    CompressibilityPhasicitySpontaneityPropertiesThrombus Aging +---------+---------------+---------+-----------+----------+--------------+ CFV      Full           Yes      Yes                                 +---------+---------------+---------+-----------+----------+--------------+  SFJ      Full                                                        +---------+---------------+---------+-----------+----------+--------------+ FV Prox  Full                                                        +---------+---------------+---------+-----------+----------+--------------+ FV Mid   Full           Yes      Yes                                 +---------+---------------+---------+-----------+----------+--------------+ FV DistalFull                                                        +---------+---------------+---------+-----------+----------+--------------+ PFV      Full                                                        +---------+---------------+---------+-----------+----------+--------------+ POP      Full           Yes      Yes                                 +---------+---------------+---------+-----------+----------+--------------+ PTV      Full                                                        +---------+---------------+---------+-----------+----------+--------------+  PERO     Full                                                        +---------+---------------+---------+-----------+----------+--------------+   +---------+---------------+---------+-----------+----------+--------------+ LEFT     CompressibilityPhasicitySpontaneityPropertiesThrombus Aging +---------+---------------+---------+-----------+----------+--------------+ CFV      Full           Yes      Yes                                 +---------+---------------+---------+-----------+----------+--------------+ SFJ      Full                                                        +---------+---------------+---------+-----------+----------+--------------+  FV Prox  Full                                                        +---------+---------------+---------+-----------+----------+--------------+ FV Mid   Full           Yes      Yes                                 +---------+---------------+---------+-----------+----------+--------------+ FV DistalFull                                                        +---------+---------------+---------+-----------+----------+--------------+ PFV      Full                                                        +---------+---------------+---------+-----------+----------+--------------+ POP      Full           Yes      Yes                                 +---------+---------------+---------+-----------+----------+--------------+ PTV      Full                                                        +---------+---------------+---------+-----------+----------+--------------+ PERO     Full                                                        +---------+---------------+---------+-----------+----------+--------------+     Summary: BILATERAL: - No evidence of deep vein thrombosis seen in the lower extremities, bilaterally. -No evidence of popliteal cyst, bilaterally.   *See table(s) above for measurements and  observations. Electronically signed by Gerarda Fraction on 01/09/2023 at 4:33:20 PM.    Final    US RENAL  Result Date: 01/08/2023 CLINICAL DATA:  Hematuria EXAM: RENAL / URINARY TRACT ULTRASOUND COMPLETE COMPARISON:  03/10/2022 FINDINGS: Right Kidney: Renal measurements: 10.3 x 5.3 x 4.8 cm = volume: 137 mL. Normal echotexture. No mass or hydronephrosis Left Kidney: Renal measurements: 7.9 x 3.8 x 4.0 cm = volume: 62 mL. Pelvic kidney. 2.5 cm upper pole cyst is stable since prior study. Normal echotexture. No mass or hydronephrosis. Bladder: Appears normal for degree of bladder distention. Other: None. IMPRESSION: No acute findings.  No hydronephrosis. Electronically Signed   By: Charlett Nose M.D.   On: 01/08/2023 01:16   DG Ankle 2 Views Left  Result Date: 01/08/2023 CLINICAL DATA:  Left ankle pain. EXAM: LEFT ANKLE - 2 VIEW COMPARISON:  None  Available. FINDINGS: There is no evidence of fracture, dislocation, or joint effusion. There is no evidence of arthropathy or other focal bone abnormality. Mild vascular calcification is seen. IMPRESSION: No acute osseous abnormality. Electronically Signed   By: Aram Candela M.D.   On: 01/08/2023 00:31   DG HIP UNILAT WITH PELVIS MIN 4 VIEWS RIGHT  Result Date: 01/08/2023 CLINICAL DATA:  Right hip pain. EXAM: DG HIP (WITH OR WITHOUT PELVIS) 4+V RIGHT COMPARISON:  None Available. FINDINGS: A total right hip replacement is seen. There is no evidence of surrounding lucency to suggest the presence of hardware loosening or infection. There is no evidence of an acute fracture or dislocation. IMPRESSION: Total right hip replacement without evidence of hardware loosening or infection. Electronically Signed   By: Aram Candela M.D.   On: 01/08/2023 00:30   DG Chest 2 View  Result Date: 01/08/2023 CLINICAL DATA:  Constipation. Altered mental status, encephalopathy. EXAM: CHEST - 2 VIEW; ABDOMEN - 1 VIEW COMPARISON:  None Available. FINDINGS: The heart size and  mediastinal contours are within normal limits. Lung volumes are low. No consolidation, effusion, or pneumothorax. Sternotomy wires are present over the midline. Degenerative changes are noted in the thoracic spine. No acute osseous abnormality. There is a nonobstructive bowel-gas pattern. A moderate amount of retained stool is present in the colon. No definite renal calculus is seen. Total hip arthroplasty changes are present in the right. IMPRESSION: 1. No active cardiopulmonary disease. 2. No evidence of bowel obstruction. 3. Moderate amount of retained stool in the colon. Electronically Signed   By: Thornell Sartorius M.D.   On: 01/08/2023 00:30   DG Abd 1 View  Result Date: 01/08/2023 CLINICAL DATA:  Constipation. Altered mental status, encephalopathy. EXAM: CHEST - 2 VIEW; ABDOMEN - 1 VIEW COMPARISON:  None Available. FINDINGS: The heart size and mediastinal contours are within normal limits. Lung volumes are low. No consolidation, effusion, or pneumothorax. Sternotomy wires are present over the midline. Degenerative changes are noted in the thoracic spine. No acute osseous abnormality. There is a nonobstructive bowel-gas pattern. A moderate amount of retained stool is present in the colon. No definite renal calculus is seen. Total hip arthroplasty changes are present in the right. IMPRESSION: 1. No active cardiopulmonary disease. 2. No evidence of bowel obstruction. 3. Moderate amount of retained stool in the colon. Electronically Signed   By: Thornell Sartorius M.D.   On: 01/08/2023 00:30   CT Head Wo Contrast  Result Date: 01/07/2023 CLINICAL DATA:  Increased aphasia EXAM: CT HEAD WITHOUT CONTRAST TECHNIQUE: Contiguous axial images were obtained from the base of the skull through the vertex without intravenous contrast. RADIATION DOSE REDUCTION: This exam was performed according to the departmental dose-optimization program which includes automated exposure control, adjustment of the mA and/or kV according to  patient size and/or use of iterative reconstruction technique. COMPARISON:  01/04/2023 FINDINGS: Brain: No evidence of acute infarction, hemorrhage, mass, mass effect, or midline shift. No hydrocephalus or extra-axial fluid collection. Mildly advanced cerebral atrophy for age. Periventricular white matter changes, likely the sequela of chronic small vessel ischemic disease. Vascular: No hyperdense vessel. Atherosclerotic calcifications in the intracranial carotid and vertebral arteries. Skull: Negative for fracture or focal lesion. Sinuses/Orbits: Mucous retention cysts in the maxillary sinuses. Status post bilateral lens replacements. Other: The mastoid air cells are well aerated. IMPRESSION: No acute intracranial process. Electronically Signed   By: Wiliam Ke M.D.   On: 01/07/2023 18:48   DG Foot Complete Right  Result Date:  01/04/2023 CLINICAL DATA:  77 year old male status post fall with pain. EXAM: RIGHT FOOT COMPLETE - 3+ VIEW COMPARISON:  None. FINDINGS: Bone mineralization is within normal limits. Calcaneus appears intact with mild degenerative spurring. Chronic 1st TMT joint space loss with degenerative spurring and sclerosis. No acute fracture or dislocation identified in the right foot. Mild calcified peripheral vascular disease. No discrete soft tissue injury. IMPRESSION: 1. No acute fracture or dislocation identified about the right foot. 2. Calcified peripheral vascular disease. Electronically Signed   By: Odessa Fleming M.D.   On: 01/04/2023 07:24   CT HEAD WO CONTRAST ( )  Result Date: 01/04/2023 CLINICAL DATA:  77 year old male with history of head trauma from a fall. EXAM: CT HEAD WITHOUT CONTRAST TECHNIQUE: Contiguous axial images were obtained from the base of the skull through the vertex without intravenous contrast. RADIATION DOSE REDUCTION: This exam was performed according to the departmental dose-optimization program which includes automated exposure control, adjustment of the mA  and/or kV according to patient size and/or use of iterative reconstruction technique. COMPARISON:  Head CT 12/28/2022. FINDINGS: Brain: Mild cerebral and cerebellar atrophy. Patchy and confluent areas of decreased attenuation are noted throughout the deep and periventricular white matter of the cerebral hemispheres bilaterally, compatible with chronic microvascular ischemic disease. No evidence of acute infarction, hemorrhage, hydrocephalus, extra-axial collection or mass lesion/mass effect. Vascular: No hyperdense vessel or unexpected calcification. Skull: Normal. Negative for fracture or focal lesion. Sinuses/Orbits: No acute finding. Multiple small mucosal retention cysts or polyps noted in the right maxillary sinus. Other: None. IMPRESSION: 1. No evidence of significant acute traumatic injury to the skull or brain. 2. Mild cerebral and cerebellar atrophy with chronic microvascular ischemic changes in the cerebral white matter, as above. Electronically Signed   By: Trudie Reed M.D.   On: 01/04/2023 05:15   DG Hip Unilat W or Wo Pelvis 2-3 Views Right  Result Date: 01/04/2023 CLINICAL DATA:  77 year old male status post fall with right hip pain. EXAM: DG HIP (WITH OR WITHOUT PELVIS) 2-3V RIGHT COMPARISON:  Right hip series earlier this month 12/26/2022. CT Chest, Abdomen, and Pelvis today are reported separately. 10/12/2022 FINDINGS: Chronic bipolar right hip arthroplasty, although new since January. Pelvis appears stable and intact. Left femoral head remains normally located, grossly intact proximal left femur. Right femur arthroplasty components appear stable, intact. No periprosthetic fracture identified, proximal right femur otherwise intact. Negative visible bowel gas. IMPRESSION: Stable right bipolar hip arthroplasty. No acute fracture or dislocation identified about the right hip or pelvis. Electronically Signed   By: Odessa Fleming M.D.   On: 01/04/2023 04:53   DG Chest 1 View  Result Date:  01/04/2023 CLINICAL DATA:  77 year old male status post fall with right hip pain. EXAM: CHEST  1 VIEW COMPARISON:  Chest CT 12/29/2022 and earlier. FINDINGS: Portable AP semi upright view at 0429 hours. Lordotic positioning. Stable lung volumes and mediastinal contours. Chronic CABG. Allowing for portable technique the lungs are clear. No pneumothorax or pleural effusion identified. Visualized tracheal air column is within normal limits. Osteopenia. Negative visible bowel gas. IMPRESSION: No acute cardiopulmonary abnormality. Electronically Signed   By: Odessa Fleming M.D.   On: 01/04/2023 04:50   ECHOCARDIOGRAM COMPLETE  Result Date: 12/30/2022    ECHOCARDIOGRAM REPORT   Patient Name:   Fernando Hess Date of Exam: 12/30/2022 Medical Rec #:  [id number]      Height:       66.0 in Accession #:    [id number]  Weight:       152.1 lb Date of Birth:  1945-11-03      BSA:          1.780 m Patient Age:    76 years       BP:           142/94 mmHg Patient Gender: M              HR:           102 bpm. Exam Location:  Inpatient Procedure: 2D Echo, Cardiac Doppler, Color Doppler and Intracardiac            Opacification Agent Indications:    Dyspnea R06.00  History:        Patient has prior history of Echocardiogram examinations, most                 recent 05/10/2021. CAD, Prior CABG, Arrythmias:Atrial                 Fibrillation, Signs/Symptoms:Syncope; Risk Factors:Hypertension,                 Sleep Apnea, Diabetes and Dyslipidemia. CKD, stage 3.  Sonographer:    Lucendia Herrlich Referring Phys: [id number] [place] IMPRESSIONS  1. Left ventricular ejection fraction, by estimation, is 60 to 65%. The left ventricle has normal function. The left ventricle has no regional wall motion abnormalities. There is moderate concentric left ventricular hypertrophy. Left ventricular diastolic parameters are consistent with Grade I diastolic dysfunction (impaired relaxation).  2. Right ventricular systolic function is normal. The  right ventricular size is normal.  3. The mitral valve is degenerative. Mild mitral valve regurgitation. No evidence of mitral stenosis.  4. Decreased LV stroke volume index. The aortic valve is tricuspid. There is moderate calcification of the aortic valve. Aortic valve regurgitation is mild. Mild aortic valve stenosis. Aortic valve mean gradient measures 11.0 mmHg.  5. The inferior vena cava is normal in size with greater than 50% respiratory variability, suggesting right atrial pressure of 3 mmHg. Comparison(s): No significant change from prior study. Prior images reviewed side by side. FINDINGS  Left Ventricle: Left ventricular ejection fraction, by estimation, is 60 to 65%. The left ventricle has normal function. The left ventricle has no regional wall motion abnormalities. Definity contrast agent was given IV to delineate the left ventricular  endocardial borders. The left ventricular internal cavity size was normal in size. There is moderate concentric left ventricular hypertrophy. Left ventricular diastolic parameters are consistent with Grade I diastolic dysfunction (impaired relaxation). Right Ventricle: The right ventricular size is normal. No increase in right ventricular wall thickness. Right ventricular systolic function is normal. Left Atrium: Left atrial size was normal in size. Right Atrium: Right atrial size was normal in size. Pericardium: There is no evidence of pericardial effusion. Mitral Valve: The mitral valve is degenerative in appearance. Mild mitral valve regurgitation. No evidence of mitral valve stenosis. Tricuspid Valve: The tricuspid valve is normal in structure. Tricuspid valve regurgitation is not demonstrated. No evidence of tricuspid stenosis. Aortic Valve: Decreased LV stroke volume index. The aortic valve is tricuspid. There is moderate calcification of the aortic valve. There is moderate aortic valve annular calcification. Aortic valve regurgitation is mild. Mild aortic stenosis  is present.  Aortic valve mean gradient measures 11.0 mmHg. Aortic valve peak gradient measures 20.2 mmHg. Aortic valve area, by VTI measures 1.40 cm. Pulmonic Valve: The pulmonic valve was normal in structure. Pulmonic valve regurgitation is not visualized.  No evidence of pulmonic stenosis. Aorta: The aortic root and ascending aorta are structurally normal, with no evidence of dilitation. Venous: The inferior vena cava is normal in size with greater than 50% respiratory variability, suggesting right atrial pressure of 3 mmHg. IAS/Shunts: The atrial septum is grossly normal.  LEFT VENTRICLE PLAX 2D LVIDd:         4.00 cm   Diastology LVIDs:         2.80 cm   LV e' medial:    5.59 cm/s LV PW:         1.40 cm   LV E/e' medial:  12.7 LV IVS:        1.40 cm   LV e' lateral:   10.10 cm/s LVOT diam:     1.90 cm   LV E/e' lateral: 7.1 LV SV:         48 LV SV Index:   27 LVOT Area:     2.84 cm  IVC IVC diam: 1.20 cm LEFT ATRIUM             Index        RIGHT ATRIUM           Index LA diam:        5.00 cm 2.81 cm/m   RA Area:     12.20 cm LA Vol (A2C):   44.4 ml 24.94 ml/m  RA Volume:   21.60 ml  12.13 ml/m LA Vol (A4C):   36.5 ml 20.50 ml/m LA Biplane Vol: 41.1 ml 23.09 ml/m  AORTIC VALVE AV Area (Vmax):    1.44 cm AV Area (Vmean):   1.35 cm AV Area (VTI):     1.40 cm AV Vmax:           225.00 cm/s AV Vmean:          152.000 cm/s AV VTI:            0.342 m AV Peak Grad:      20.2 mmHg AV Mean Grad:      11.0 mmHg LVOT Vmax:         114.67 cm/s LVOT Vmean:        72.433 cm/s LVOT VTI:          0.169 m LVOT/AV VTI ratio: 0.49  AORTA Ao Root diam: 3.30 cm Ao Asc diam:  3.80 cm MITRAL VALVE               TRICUSPID VALVE MV Area (PHT): 4.41 cm    TR Peak grad:   8.8 mmHg MV Decel Time: 172 msec    TR Vmax:        148.00 cm/s MV E velocity: 71.25 cm/s MV A velocity: 97.70 cm/s  SHUNTS MV E/A ratio:  0.73        Systemic VTI:  0.17 m                            Systemic Diam: 1.90 cm Riley Lam MD  Electronically signed by Riley Lam MD Signature Date/Time: 12/30/2022/12:31:30 PM    Final    CT CHEST WO CONTRAST  Result Date: 12/29/2022 CLINICAL DATA:  Dyspnea, chronic, unclear etiology EXAM: CT CHEST WITHOUT CONTRAST TECHNIQUE: Multidetector CT imaging of the chest was performed following the standard protocol without IV contrast. RADIATION DOSE REDUCTION: This exam was performed according to the departmental dose-optimization program which includes automated exposure control, adjustment of the mA and/or  kV according to patient size and/or use of iterative reconstruction technique. COMPARISON:  Radiograph yesterday. Chest CT 10/12/2022 FINDINGS: Cardiovascular: Prior CABG with calcification of native coronary arteries. Aortic atherosclerosis. Fusiform dilatation of the ascending aorta, maximal dimension 4.2 cm. There is no periaortic stranding. Borderline cardiomegaly. No pericardial effusion. Mediastinum/Nodes: No mediastinal adenopathy. Limited hilar assessment on this unenhanced exam. No esophageal wall thickening. No thyroid nodule. Lungs/Pleura: Mild diffuse heterogeneity of pulmonary parenchyma. Bronchial thickening most pronounced in the lower lobes. No confluent consolidation. No features of pulmonary edema. No pleural effusion. Upper Abdomen: Large volume of stool in the included colon. No acute upper abdominal findings Musculoskeletal: There is vague sclerosis throughout T9 vertebral body that was not seen on prior exam. Slight inferior endplate undulation may represent a mild compression fracture. T6-T7 disc space narrowing and irregularity, chronic. Prior median sternotomy. Mild bilateral gynecomastia. IMPRESSION: 1. Mild diffuse heterogeneity of pulmonary parenchyma, can be seen with small airways disease. 2. Bronchial thickening most pronounced in the lower lobes. 3. Vague sclerosis throughout T9 vertebral body was not seen on prior exam. Slight inferior endplate undulation may  represent a mild compression fracture. Recommend further evaluation with thoracic spine MRI. 4. Fusiform dilatation of the ascending aorta, maximal dimension 4.2 cm. Recommend annual imaging follow-up by CTA or MRA. 2010; 121: [id number]. Aortic Atherosclerosis (ICD10-I70.0). Electronically Signed   By: Narda Rutherford M.D.   On: 12/29/2022 16:01   NM Pulmonary Perfusion  Result Date: 12/29/2022 CLINICAL DATA:  Cardiovascular disease EXAM: NUCLEAR MEDICINE PERFUSION LUNG SCAN TECHNIQUE: Perfusion images were obtained in multiple projections after intravenous injection of radiopharmaceutical. Ventilation scans intentionally deferred if perfusion scan and chest x-ray adequate for interpretation during COVID 19 epidemic. RADIOPHARMACEUTICALS:  3.7 mCi Tc-16m MAA IV COMPARISON:  None Available. FINDINGS: No significant segmental or subsegmental perfusion defects are identified to suggest the presence of PE. IMPRESSION: No perfusion defects identified. Electronically Signed   By: Layla Maw M.D.   On: 12/29/2022 09:53   DG CHEST PORT 1 VIEW  Result Date: 12/28/2022 CLINICAL DATA:  Shortness of breath EXAM: PORTABLE CHEST 1 VIEW COMPARISON:  12/25/2022 FINDINGS: Lungs are clear.  No pleural effusion or pneumothorax. Mild cardiomegaly.  Postsurgical changes related to prior CABG. Median sternotomy. IMPRESSION: No acute cardiopulmonary disease. Electronically Signed   By: Charline Bills M.D.   On: 12/28/2022 20:33   CT HEAD WO CONTRAST ( )  Result Date: 12/28/2022 CLINICAL DATA:  Mental status change, unknown cause EXAM: CT HEAD WITHOUT CONTRAST TECHNIQUE: Contiguous axial images were obtained from the base of the skull through the vertex without intravenous contrast. RADIATION DOSE REDUCTION: This exam was performed according to the departmental dose-optimization program which includes automated exposure control, adjustment of the mA and/or kV according to patient size and/or use of iterative  reconstruction technique. COMPARISON:  CT head 10/12/2022. FINDINGS: Brain: No evidence of acute infarction, hemorrhage, hydrocephalus, extra-axial collection or mass lesion/mass effect. Patchy white matter hypodensities, nonspecific but compatible with chronic microvascular ischemic change. Vascular: No hyperdense vessel. Skull: No acute fracture. Sinuses/Orbits: Right maxillary sinus retention cyst. Otherwise, clear sinuses. No acute orbital findings. Other: No mastoid effusions. IMPRESSION: No evidence of acute intracranial abnormality. Electronically Signed   By: Feliberto Harts M.D.   On: 12/28/2022 12:16   DG HIP UNILAT W OR W/O PELVIS 2-3 VIEWS RIGHT  Result Date: 12/26/2022 CLINICAL DATA:  Postop EXAM: DG HIP (WITH OR WITHOUT PELVIS) 2-3V RIGHT COMPARISON:  12/24/2022 FINDINGS: Interval right hip replacement with intact hardware and normal  alignment. SI joints are patent. Pubic symphysis is intact. Gas in the soft tissues consistent with recent surgery IMPRESSION: Status post right hip replacement with expected postsurgical change. Electronically Signed   By: Jasmine Pang M.D.   On: 12/26/2022 16:01   DG Knee Right Port  Result Date: 12/25/2022 CLINICAL DATA:  Femoral neck fracture EXAM: PORTABLE RIGHT KNEE - 1-2 VIEW COMPARISON:  Hip radiograph 12/24/2022 FINDINGS: No fracture or malalignment. No sizable knee effusion. Clips in the proximal lower leg IMPRESSION: Negative. Electronically Signed   By: Jasmine Pang M.D.   On: 12/25/2022 20:47   DG Chest Port 1 View  Result Date: 12/25/2022 CLINICAL DATA:  Dyspnea. EXAM: PORTABLE CHEST 1 VIEW COMPARISON:  December 24, 2022. FINDINGS: Stable cardiomediastinal silhouette. Status post coronary bypass graft. Hypoinflation of the lungs with mild bibasilar subsegmental atelectasis or possibly infiltrate. Bony thorax is unremarkable. IMPRESSION: Hypoinflation of the lungs with mild bibasilar subsegmental atelectasis or possibly infiltrates. Electronically  Signed   By: Lupita Raider M.D.   On: 12/25/2022 16:00   DG Chest 1 View  Result Date: 12/24/2022 CLINICAL DATA:  Hip fracture EXAM: CHEST  1 VIEW COMPARISON:  Chest x-ray dated October 12, 2022 FINDINGS: Cardiac and mediastinal contours are unchanged post median sternotomy and CABG. Elevation of the right hemidiaphragm and right basilar atelectasis. No evidence of pleural effusion or pneumothorax. IMPRESSION: Elevation of the right hemidiaphragm and right basilar atelectasis. Electronically Signed   By: Allegra Lai M.D.   On: 12/24/2022 17:17   DG Hip Unilat W or Wo Pelvis 2-3 Views Right  Result Date: 12/24/2022 CLINICAL DATA:  Fall EXAM: DG HIP (WITH OR WITHOUT PELVIS) 2-3V RIGHT COMPARISON:  None Available. FINDINGS: There is an acute impacted subcapital right femoral neck fracture. No dislocation. The bones are osteopenic. No significant displacement. IMPRESSION: Acute impacted subcapital right femoral neck fracture. Electronically Signed   By: Darliss Cheney M.D.   On: 12/24/2022 15:57    Microbiology: Recent Results (from the past 240 hour(s))  Urine Culture     Status: Abnormal   Collection Time: 01/07/23 10:03 PM   Specimen: Urine, Clean Catch  Result Value Ref Range Status   Specimen Description URINE, CLEAN CATCH  Final   Special Requests NONE  Final   Culture (A)  Final    <10,000 COLONIES/mL INSIGNIFICANT GROWTH Performed at [facility], [address], [place], [name] [address]    Report Status 01/09/2023 FINAL  Final  SARS Coronavirus 2 by RT PCR (hospital order, performed in [facility] hospital lab) *cepheid single result test* Anterior Nasal Swab     Status: None   Collection Time: 01/07/23 11:38 PM   Specimen: Anterior Nasal Swab  Result Value Ref Range Status   SARS Coronavirus 2 by RT PCR NEGATIVE NEGATIVE Final    Comment: Performed at [facility] [name] [facility], [address], [place], [name] [address]     Labs: Basic Metabolic Panel: Recent Labs  Lab  01/07/23 1638 01/07/23 2338 01/08/23 0012 01/08/23 0735 01/09/23 0653 01/11/23 0745  NA 139  --  141 140 138 139  K 5.0  --  4.3 4.1 4.4 4.1  CL 103  --   --  107 102 105  CO2 26  --   --  26 25 25   GLUCOSE 286*  --   --  148* 219* 193*  BUN 26*  --   --  21 13 17   CREATININE 2.10*  --   --  1.94* 1.59* 1.54*  CALCIUM 8.6*  --   --  8.3* 8.6* 8.5*  MG 2.0 1.9  --   --  1.8  --   PHOS  --  3.0  --   --  3.5  --    Liver Function Tests: Recent Labs  Lab 01/07/23 1638 01/08/23 0735 01/09/23 0653  AST 39 32  --   ALT 31 32  --   ALKPHOS 128* 113  --   BILITOT 0.9 1.0  --   PROT 5.2* 5.1*  --   ALBUMIN 2.4* 2.4* 2.5*   No results for input(s): "LIPASE", "AMYLASE" in the last 168 hours. Recent Labs  Lab 01/08/23 1417  AMMONIA 24   CBC: Recent Labs  Lab 01/07/23 1638 01/07/23 2338 01/08/23 0012 01/08/23 0735 01/09/23 0653 01/11/23 0745  WBC 10.0 10.1  --  11.2* 11.2* 6.8  NEUTROABS 6.8 6.5  --   --   --  4.0  HGB 11.0* 10.5* 10.9* 11.0* 12.3* 11.4*  HCT 34.2* 32.0* 32.0* 35.4* 38.2* 35.4*  MCV 90.2 91.2  --  93.2 90.5 90.3  PLT 399 373  --  401* 418* 372   Cardiac Enzymes: Recent Labs  Lab 01/07/23 2338  CKTOTAL 90   BNP: BNP (last 3 results) Recent Labs    09/04/22 1500 12/29/22 0203  BNP 188.2* 374.0*    ProBNP (last 3 results) No results for input(s): "PROBNP" in the last 8760 hours.  CBG: Recent Labs  Lab 01/11/23 0620 01/11/23 1122 01/11/23 1607 01/11/23 2116 01/12/23 0610  GLUCAP 133* 319* 254* 129* 209*       Signed:  Rhetta Mura MD   [facility] 01/12/2023, 11:56 AM

## 2023-01-12 NOTE — TOC Transition Note (Signed)
Transition of Care [facility]) - CM/SW Discharge Note   Patient Details  Name: Fernando Hess MRN: [id number] Date of Birth: 1946/01/16  Transition of Care [facility]) CM/SW Contact:  Erin Sons, LCSW Phone Number: 01/12/2023, 12:55 PM   Clinical Narrative:     Berkley Harvey approved 4/29- 5/1 Auth#: [id number]  Patient will DC to: [name] Anticipated DC date: 01/12/23 Family notified:Spouse Transport by: Sharin Mons   Per MD patient ready for DC to [facility]. RN, patient, patient's family, and facility notified of DC. Discharge Summary and FL2 sent to facility. RN to call report prior to discharge [phone]). DC packet on chart. Ambulance transport requested for patient.   CSW will sign off for now as social work intervention is no longer needed. Please consult [place] again if new needs arise.   Final next level of care: Skilled Nursing Facility Barriers to Discharge: No Barriers Identified   Patient Goals and CMS Choice   Choice offered to / list presented to : Spouse  Discharge Placement                Patient chooses bed at: [facility] Patient to be transferred to facility by: [facility] Name of family member notified: spouse Patient and family notified of of transfer: 01/12/23  Discharge Plan and Services Additional resources added to the After Visit Summary for                                       Social Determinants of Health (SDOH) Interventions SDOH Screenings   Food Insecurity: No Food Insecurity (09/18/2022)  Housing: Low Risk  (09/18/2022)  Transportation Needs: No Transportation Needs (09/18/2022)  Utilities: Not At Risk (09/18/2022)  Depression (PHQ2-9): Low Risk  (05/20/2019)  Tobacco Use: Low Risk  (01/07/2023)     Readmission Risk Interventions     No data to display

## 2023-01-12 NOTE — Care Management Important Message (Signed)
Important Message  Patient Details  Name: Fernando Hess MRN: [id number] Date of Birth: [birth year]   Medicare Important Message Given:  Yes     Dorena Bodo 01/12/2023, 3:22 PM

## 2023-01-14 ENCOUNTER — Ambulatory Visit (HOSPITAL_BASED_OUTPATIENT_CLINIC_OR_DEPARTMENT_OTHER): Payer: Medicare Other | Admitting: Physical Therapy

## 2023-01-15 ENCOUNTER — Ambulatory Visit (HOSPITAL_COMMUNITY): Payer: Medicare Other | Attending: Cardiology

## 2023-01-16 ENCOUNTER — Encounter (HOSPITAL_COMMUNITY): Payer: Self-pay | Admitting: Cardiology

## 2023-01-19 NOTE — Progress Notes (Deleted)
Cardiology Office Note:    Date:  01/19/2023  ID:  RYETT EASTER, DOB 01-04-1946, MRN [id number] PCP: [name], MD  [facility] Providers Cardiologist:  Meriam Sprague, MD Cardiology APP:  Beatrice Lecher, [name]  Electrophysiologist:  Lanier Prude, MD { Click to update primary MD,subspecialty MD or APP then REFRESH:1}    {Click to Open Review  :1}      Patient Profile:   Coronary artery disease S/p MI in 09/2015 >> s/p CABG S/p inferolateral STEMI 2/17 >> S-OM1 and S-OM2 occluded; dOM occluded >> med Rx Myoview 10/2018: no isch, EF 52, apical thinning  Myoview 7/21: low risk  Myoview 12/22: low risk  Heart failure with preserved ejection fraction  admx in 11/2019 TTE 12/30/22: EF 60-65, no RWMA, mod LVH, Gr 1 DD, NL RVSF, mild MR, mild AI, mild AS, mean AV 11, RAP 3 Chronic shortness of breath  Extensive w/u unremarkable for cardiac cause (RHC w low filling pressures, TTE w NL LV and RV fn, CPET w NL cardiopulm fn) in 2022 Paroxysmal atrial fibrillation CHA2DS2-VASc Score = 6 >> Apixaban   Tachy-brady syndrome (eval by Dr. Lalla Brothers with EP in 11/21)   Mild aortic stenosis (echo 8/22: Mean 11.7) Diabetes mellitus Hypertension Hyperlipidemia Chronic kidney disease stage IV Obstructive sleep apnea Peripheral arterial disease eval by Dr. Allyson Sabal 7.2020; bilat tibial disease >> Med Rx (Pletal) >> DC'd with CHF   Prior CV Studies: Myoview 08/16/21 No ischemia or infarction; EF 55; Low Risk    Cardiac monitor 06/07/21 NSR, Average heart rate of 76 bpm; freq short runs of Supraventricular Tachycardia; isolated PACs (7.3%), PVCs   R cardiac catheterization 06/06/21 RA = 2, RV = 32/5, PA = 31/11 (19), PCW = 4 Fick cardiac output/index = 4.1/2.1 PVR = 3.6 WU Assessment: 1. Low filling pressures with moderately reduce CO   Echocardiogram 05/10/21 EF 60-65, mild LVH, GR 1 DD, mildly reduced RVSF, mild LAE, trivial MR, moderate AV calcification, mild AS (mean  11.7, V-max 250 cm/s)   Carotid US 06/21/20 Bilat ICA 1-39   Event monitor 04/2020 Freq episodes of AF w RVR; bradycardia >> Tachy-brady syndrome   Myoview 03/20/20 EF 65, apical thinning artifact, no ischemia, low risk    Echocardiogram 11/19/2019 EF 60-65, moderate LVH, GR 1 DD, no RWMA, mild AI, mild MR, mildly dilated ascending aorta (39 mm)   LE arterial US 02/11/2019 Right PTA and ATA occluded; left ATA occluded   Myoview 10/21/2018 Low risk, mildly abnormal stress nuclear study with apical thinning but no ischemia; EF 52 with mild global hypokinesis    Holter 06/11/18 Very frequent supraventricular ectopy, contributing to 15% of overall beats. Very frequent couplets and a very short runs of atrial tachycardia.   Echocardiogram 10/23/2015 EF 55-60, normal wall motion, mildly calcified aortic valve leaflets, mild AI, mild to moderate MR   Cardiac catheterization 10/22/2015 LM ostial 50 LAD mid 90 RI ostial 90 LCx proximal 80; OM1 80; OM2 100 (culprit for MI) RCA mid 50 LIMA-LAD patent SVG-RPDA patent SVG-D1 patent SVG-OM1 100 SVG-OM2 100 Medical therapy   Pre-CABG Dopplers 09/28/2015 Bilat ICA 1-39          History of Present Illness:   Fernando Hess is a 77 y.o. male who returns for f/u of CAD, CHF, Afib. He was last seen by Dr. Shari Prows 07/17/22. He has had multiple recent admissions, ED visits. He has had several falls in the last several mos. He was admitted  in Jan 2024 for AMS w concerns for stroke. Notes indicate he was to have a repeat MRI 4 weeks post DC. He was admitted in 12/2022 for R hip fx s/p ORIF. He was readmitted in 12/2022 for AMS from polypharmacy, dementia. Notes indicated concerns for myasthenia gravis, but neuro did not think this was the dx. He was seen in the ED at [facility] ED on 01/15/23 with a fall. He was just admitted to [facility] 01/18/23 with a fall at his SNF [facility]). ***  ROS ***    Studies Reviewed:    EKG:   ***  *** Risk Assessment/Calculations:   {Does this patient have ATRIAL FIBRILLATION?:[phone]} No BP recorded.  {Refresh Note OR Click here to enter BP  :1}***       Physical Exam:   VS:  There were no vitals taken for this visit.   Wt Readings from Last 3 Encounters:  01/12/23 187 lb 13.3 oz (85.2 kg)  01/04/23 152 lb 1.9 oz (69 kg)  12/26/22 152 lb 1.9 oz (69 kg)    Physical Exam***    ASSESSMENT AND PLAN:   No problem-specific Assessment & Plan notes found for this encounter. ***{    Dyspnea Etiology is not clear.  He has PFTs pending with pulmonology in August.  He did not have improvement in his symptoms with septoplasty.  Myoview in 08/2021 was low risk and neg for ischemia.  He is not having chest pain or his angina equiv (bilat neck pain).  RHC in 9/22 demonstrated low filling pressures and his diuretic dose was reduced.  I have recommended that he undergo cardiopulmonary stress test to better define his dyspnea. Arrange CPET Check BNP, CMET   Coronary artery disease involving native coronary artery of native heart without angina pectoris He has fairly chronic dyspnea on exertion.  He has not had chest pain or his anginal equivalent.  His recent Myoview was low risk.  Proceed with CPET as noted.  He is not on aspirin as he is on Apixaban.  Continue atorvastatin 40 mg daily.  Follow-up 3 months.   Essential hypertension Blood pressure well controlled.  He is not currently on medical therapy.   Hyperlipidemia LDL goal <70 LDL on most recent labs above goal at 84.  Continue atorvastatin 40 mg daily.  If his LDL remains above 70, consider increasing atorvastatin versus adding ezetimibe. CMET, Fasting Lipids today    Paroxysmal atrial fibrillation ([name]) He is maintaining sinus rhythm.  Continue apixaban 5 mg twice daily.  His weight is over 60 kg and he is under 66 years old.  Recent hemoglobin normal.  He has a history of tachybradycardia syndrome.  He is no longer on  beta-blocker therapy.  He has not had further syncope.   Chronic heart failure with preserved ejection fraction (HFpEF) ([name]) Volume status appears stable.  He is NYHA IIb-III.  Continue furosemide 20 mg daily.  GFR has been above 30.  Consider starting SGLT2 inhibitor if CPET indicates circulatory limitations related to heart failure.   Mild aortic stenosis I could not appreciate a murmur on exam today.  His last echocardiogram in 8/22 demonstrated a mean gradient of 11.7 mmHg.  Plan follow-up echocardiogram in the next 1 to 2 years.   CKD (chronic kidney disease) stage 4, GFR 15-29 ml/min ([name]) He is followed by Dr. Hyman Hopes with nephrology.  :1}     {Are you ordering a CV Procedure (e.g. stress test, cath, DCCV,  TEE, etc)?   Press F2        :[id number]}  Dispo:  No follow-ups on file.  Signed, Tereso Newcomer, [name]

## 2023-01-20 ENCOUNTER — Ambulatory Visit: Payer: Medicare Other | Admitting: Physician Assistant

## 2023-02-02 ENCOUNTER — Emergency Department (HOSPITAL_COMMUNITY): Payer: Medicare Other

## 2023-02-02 ENCOUNTER — Encounter (HOSPITAL_COMMUNITY): Payer: Self-pay

## 2023-02-02 ENCOUNTER — Inpatient Hospital Stay (HOSPITAL_COMMUNITY)
Admission: EM | Admit: 2023-02-02 | Discharge: 2023-02-08 | DRG: 640 | Disposition: A | Discharge status: Home Health | Payer: Medicare Other | Attending: Family Medicine | Admitting: Family Medicine

## 2023-02-02 ENCOUNTER — Other Ambulatory Visit: Payer: Self-pay

## 2023-02-02 DIAGNOSIS — Z9049 Acquired absence of other specified parts of digestive tract: Secondary | ICD-10-CM

## 2023-02-02 DIAGNOSIS — G3289 Other specified degenerative disorders of nervous system in diseases classified elsewhere: Secondary | ICD-10-CM | POA: Diagnosis present

## 2023-02-02 DIAGNOSIS — N179 Acute kidney failure, unspecified: Secondary | ICD-10-CM

## 2023-02-02 DIAGNOSIS — E1122 Type 2 diabetes mellitus with diabetic chronic kidney disease: Secondary | ICD-10-CM | POA: Diagnosis present

## 2023-02-02 DIAGNOSIS — I2510 Atherosclerotic heart disease of native coronary artery without angina pectoris: Secondary | ICD-10-CM | POA: Diagnosis present

## 2023-02-02 DIAGNOSIS — Z79899 Other long term (current) drug therapy: Secondary | ICD-10-CM

## 2023-02-02 DIAGNOSIS — Z9181 History of falling: Secondary | ICD-10-CM

## 2023-02-02 DIAGNOSIS — R6520 Severe sepsis without septic shock: Secondary | ICD-10-CM

## 2023-02-02 DIAGNOSIS — Z961 Presence of intraocular lens: Secondary | ICD-10-CM | POA: Diagnosis present

## 2023-02-02 DIAGNOSIS — Z794 Long term (current) use of insulin: Secondary | ICD-10-CM

## 2023-02-02 DIAGNOSIS — Y92003 Bedroom of unspecified non-institutional (private) residence as the place of occurrence of the external cause: Secondary | ICD-10-CM

## 2023-02-02 DIAGNOSIS — Z87442 Personal history of urinary calculi: Secondary | ICD-10-CM

## 2023-02-02 DIAGNOSIS — N1832 Chronic kidney disease, stage 3b: Secondary | ICD-10-CM | POA: Diagnosis present

## 2023-02-02 DIAGNOSIS — E1151 Type 2 diabetes mellitus with diabetic peripheral angiopathy without gangrene: Secondary | ICD-10-CM | POA: Diagnosis present

## 2023-02-02 DIAGNOSIS — E1169 Type 2 diabetes mellitus with other specified complication: Secondary | ICD-10-CM

## 2023-02-02 DIAGNOSIS — E876 Hypokalemia: Secondary | ICD-10-CM | POA: Diagnosis present

## 2023-02-02 DIAGNOSIS — I25810 Atherosclerosis of coronary artery bypass graft(s) without angina pectoris: Secondary | ICD-10-CM | POA: Diagnosis present

## 2023-02-02 DIAGNOSIS — Z9842 Cataract extraction status, left eye: Secondary | ICD-10-CM

## 2023-02-02 DIAGNOSIS — E1165 Type 2 diabetes mellitus with hyperglycemia: Secondary | ICD-10-CM | POA: Diagnosis present

## 2023-02-02 DIAGNOSIS — K219 Gastro-esophageal reflux disease without esophagitis: Secondary | ICD-10-CM | POA: Diagnosis present

## 2023-02-02 DIAGNOSIS — E860 Dehydration: Secondary | ICD-10-CM | POA: Diagnosis not present

## 2023-02-02 DIAGNOSIS — R9431 Abnormal electrocardiogram [ECG] [EKG]: Secondary | ICD-10-CM | POA: Diagnosis present

## 2023-02-02 DIAGNOSIS — I480 Paroxysmal atrial fibrillation: Secondary | ICD-10-CM | POA: Diagnosis present

## 2023-02-02 DIAGNOSIS — I959 Hypotension, unspecified: Secondary | ICD-10-CM | POA: Diagnosis not present

## 2023-02-02 DIAGNOSIS — M5417 Radiculopathy, lumbosacral region: Secondary | ICD-10-CM | POA: Diagnosis present

## 2023-02-02 DIAGNOSIS — Z9981 Dependence on supplemental oxygen: Secondary | ICD-10-CM

## 2023-02-02 DIAGNOSIS — G4733 Obstructive sleep apnea (adult) (pediatric): Secondary | ICD-10-CM | POA: Diagnosis present

## 2023-02-02 DIAGNOSIS — Z9841 Cataract extraction status, right eye: Secondary | ICD-10-CM

## 2023-02-02 DIAGNOSIS — E113392 Type 2 diabetes mellitus with moderate nonproliferative diabetic retinopathy without macular edema, left eye: Secondary | ICD-10-CM | POA: Diagnosis present

## 2023-02-02 DIAGNOSIS — I5032 Chronic diastolic (congestive) heart failure: Secondary | ICD-10-CM | POA: Diagnosis present

## 2023-02-02 DIAGNOSIS — G9341 Metabolic encephalopathy: Secondary | ICD-10-CM | POA: Diagnosis present

## 2023-02-02 DIAGNOSIS — Z881 Allergy status to other antibiotic agents status: Secondary | ICD-10-CM

## 2023-02-02 DIAGNOSIS — R531 Weakness: Secondary | ICD-10-CM

## 2023-02-02 DIAGNOSIS — E873 Alkalosis: Secondary | ICD-10-CM | POA: Diagnosis present

## 2023-02-02 DIAGNOSIS — N529 Male erectile dysfunction, unspecified: Secondary | ICD-10-CM | POA: Diagnosis present

## 2023-02-02 DIAGNOSIS — Z7901 Long term (current) use of anticoagulants: Secondary | ICD-10-CM

## 2023-02-02 DIAGNOSIS — I7121 Aneurysm of the ascending aorta, without rupture: Secondary | ICD-10-CM | POA: Diagnosis present

## 2023-02-02 DIAGNOSIS — I4510 Unspecified right bundle-branch block: Secondary | ICD-10-CM | POA: Diagnosis present

## 2023-02-02 DIAGNOSIS — I130 Hypertensive heart and chronic kidney disease with heart failure and stage 1 through stage 4 chronic kidney disease, or unspecified chronic kidney disease: Secondary | ICD-10-CM | POA: Diagnosis present

## 2023-02-02 DIAGNOSIS — N189 Chronic kidney disease, unspecified: Secondary | ICD-10-CM | POA: Diagnosis present

## 2023-02-02 DIAGNOSIS — Z96641 Presence of right artificial hip joint: Secondary | ICD-10-CM | POA: Diagnosis present

## 2023-02-02 DIAGNOSIS — E119 Type 2 diabetes mellitus without complications: Secondary | ICD-10-CM

## 2023-02-02 DIAGNOSIS — W19XXXA Unspecified fall, initial encounter: Secondary | ICD-10-CM

## 2023-02-02 DIAGNOSIS — F0390 Unspecified dementia without behavioral disturbance: Secondary | ICD-10-CM | POA: Diagnosis present

## 2023-02-02 DIAGNOSIS — G7000 Myasthenia gravis without (acute) exacerbation: Secondary | ICD-10-CM | POA: Diagnosis present

## 2023-02-02 DIAGNOSIS — W06XXXA Fall from bed, initial encounter: Secondary | ICD-10-CM | POA: Diagnosis not present

## 2023-02-02 DIAGNOSIS — Y92009 Unspecified place in unspecified non-institutional (private) residence as the place of occurrence of the external cause: Secondary | ICD-10-CM

## 2023-02-02 DIAGNOSIS — E785 Hyperlipidemia, unspecified: Secondary | ICD-10-CM | POA: Diagnosis present

## 2023-02-02 DIAGNOSIS — I252 Old myocardial infarction: Secondary | ICD-10-CM

## 2023-02-02 DIAGNOSIS — Z8249 Family history of ischemic heart disease and other diseases of the circulatory system: Secondary | ICD-10-CM

## 2023-02-02 DIAGNOSIS — Z9852 Vasectomy status: Secondary | ICD-10-CM

## 2023-02-02 DIAGNOSIS — Z888 Allergy status to other drugs, medicaments and biological substances status: Secondary | ICD-10-CM

## 2023-02-02 DIAGNOSIS — Z833 Family history of diabetes mellitus: Secondary | ICD-10-CM

## 2023-02-02 DIAGNOSIS — R339 Retention of urine, unspecified: Secondary | ICD-10-CM | POA: Diagnosis present

## 2023-02-02 DIAGNOSIS — F419 Anxiety disorder, unspecified: Secondary | ICD-10-CM | POA: Diagnosis present

## 2023-02-02 DIAGNOSIS — J9601 Acute respiratory failure with hypoxia: Secondary | ICD-10-CM | POA: Diagnosis present

## 2023-02-02 DIAGNOSIS — Z7951 Long term (current) use of inhaled steroids: Secondary | ICD-10-CM

## 2023-02-02 DIAGNOSIS — R6510 Systemic inflammatory response syndrome (SIRS) of non-infectious origin without acute organ dysfunction: Secondary | ICD-10-CM | POA: Diagnosis present

## 2023-02-02 LAB — COMPREHENSIVE METABOLIC PANEL
ALT: 24 U/L (ref 0–44)
AST: 32 U/L (ref 15–41)
Albumin: 2.6 g/dL — ABNORMAL LOW (ref 3.5–5.0)
Alkaline Phosphatase: 122 U/L (ref 38–126)
Anion gap: 11 (ref 5–15)
BUN: 33 mg/dL — ABNORMAL HIGH (ref 8–23)
CO2: 25 mmol/L (ref 22–32)
Calcium: 8.6 mg/dL — ABNORMAL LOW (ref 8.9–10.3)
Chloride: 107 mmol/L (ref 98–111)
Creatinine, Ser: 2.25 mg/dL — ABNORMAL HIGH (ref 0.61–1.24)
GFR, Estimated: 29 mL/min — ABNORMAL LOW (ref >60)
Glucose, Bld: 136 mg/dL — ABNORMAL HIGH (ref 70–99)
Potassium: 3.7 mmol/L (ref 3.5–5.1)
Sodium: 143 mmol/L (ref 135–145)
Total Bilirubin: 0.7 mg/dL (ref 0.3–1.2)
Total Protein: 5.3 g/dL — ABNORMAL LOW (ref 6.5–8.1)

## 2023-02-02 LAB — I-STAT VENOUS BLOOD GAS, ED
Acid-Base Excess: 3 mmol/L — ABNORMAL HIGH (ref 0.0–2.0)
Bicarbonate: 26.4 mmol/L (ref 20.0–28.0)
Calcium, Ion: 1.13 mmol/L — ABNORMAL LOW (ref 1.15–1.40)
HCT: 36 % — ABNORMAL LOW (ref 39.0–52.0)
Hemoglobin: 12.2 g/dL — ABNORMAL LOW (ref 13.0–17.0)
O2 Saturation: 87 %
Potassium: 3.6 mmol/L (ref 3.5–5.1)
Sodium: 144 mmol/L (ref 135–145)
TCO2: 27 mmol/L (ref 22–32)
pCO2, Ven: 35.2 mmHg — ABNORMAL LOW (ref 44–60)
pH, Ven: 7.483 — ABNORMAL HIGH (ref 7.25–7.43)
pO2, Ven: 49 mmHg — ABNORMAL HIGH (ref 32–45)

## 2023-02-02 LAB — CBC WITH DIFFERENTIAL/PLATELET
Abs Immature Granulocytes: 0.23 10*3/uL — ABNORMAL HIGH (ref 0.00–0.07)
Basophils Absolute: 0.1 10*3/uL (ref 0.0–0.1)
Basophils Relative: 1 %
Eosinophils Absolute: 0.2 10*3/uL (ref 0.0–0.5)
Eosinophils Relative: 2 %
HCT: 36.5 % — ABNORMAL LOW (ref 39.0–52.0)
Hemoglobin: 11.8 g/dL — ABNORMAL LOW (ref 13.0–17.0)
Immature Granulocytes: 2 %
Lymphocytes Relative: 12 %
Lymphs Abs: 1.6 10*3/uL (ref 0.7–4.0)
MCH: 29.4 pg (ref 26.0–34.0)
MCHC: 32.3 g/dL (ref 30.0–36.0)
MCV: 90.8 fL (ref 80.0–100.0)
Monocytes Absolute: 1.3 10*3/uL — ABNORMAL HIGH (ref 0.1–1.0)
Monocytes Relative: 10 %
Neutro Abs: 9.5 10*3/uL — ABNORMAL HIGH (ref 1.7–7.7)
Neutrophils Relative %: 73 %
Platelets: 310 10*3/uL (ref 150–400)
RBC: 4.02 MIL/uL — ABNORMAL LOW (ref 4.22–5.81)
RDW: 15.9 % — ABNORMAL HIGH (ref 11.5–15.5)
WBC: 12.9 10*3/uL — ABNORMAL HIGH (ref 4.0–10.5)
nRBC: 0 % (ref 0.0–0.2)

## 2023-02-02 NOTE — ED Provider Notes (Signed)
[place] EMERGENCY DEPARTMENT AT [facility] Provider Note   CSN: [id number] Arrival date & time: 02/02/23  2135     History {Add pertinent medical, surgical, social history, OB history to HPI:1} Chief Complaint  Patient presents with   Hypotension    Arrived via ems after fell out of bed and found to be hypotensive hx of myastenia garavis ems vs 109/59 90 32 97%ra cbg 91 given NS pta    DOMINQUE KRAVCHUK is a 77 y.o. male.  77 year old male with history of myasthenia gravis, hypertension, hyperlipidemia, STEMI, CABG x 5, diabetes, CKD, CHF, on Eliquis brought in by EMS from home after fall.  Patient states that he was in his baseline state of health tonight around 730 when he went to bed and slipped on the sheet causing him to fall to the ground.  He has felt globally weak since that time, denies hitting his head or LOC.  States that he has pain in his right arm and his right leg similar to prior pains he has had without acute changes.  Reports that due to his myasthenia, he falls frequently.  He states that he does not always come to the hospital it really just depends on what the paramedics say when they come to check on him, tonight he was found to be hypotensive although blood pressure on arrival of EMS is unknown to the patient, per RN intake note- 109/59.       Home Medications Prior to Admission medications   Medication Sig Start Date End Date Taking? Authorizing Provider  apixaban (ELIQUIS) 5 MG TABS tablet Take 1 tablet (5 mg total) by mouth 2 (two) times daily. 01/01/23   Lorin Glass, MD  atorvastatin (LIPITOR) 80 MG tablet TAKE ONE TABLET BY MOUTH EVERY EVENING Patient taking differently: Take 80 mg by mouth at bedtime. 11/13/22   Weaver, Lorin Picket T, [name]  B-D UF III MINI PEN NEEDLES 31G X 5 MM MISC USE AS DIRECTED WITH LANTUS SOLOSTAR 04/02/19   [provider]  budesonide (PULMICORT) 0.25 MG/2ML nebulizer solution Take 2 mLs (0.25 mg total) by  nebulization 2 (two) times daily. 01/01/23   Lorin Glass, MD  buPROPion (WELLBUTRIN XL) 150 MG 24 hr tablet Take 150 mg by mouth every morning. 12/10/22   [provider]  Continuous Blood Gluc Sensor (FREESTYLE LIBRE 2 SENSOR) MISC Inject 1 Device into the skin every 14 (fourteen) days.    [provider]  DULoxetine (CYMBALTA) 30 MG capsule Take 1 capsule (30 mg total) by mouth every morning. 01/13/23   Rhetta Mura, MD  feeding supplement, GLUCERNA SHAKE, (GLUCERNA SHAKE) LIQD Take 237 mLs by mouth 3 (three) times daily between meals. 01/01/23   Lorin Glass, MD  hydrALAZINE (APRESOLINE) 25 MG tablet Take 1 tablet (25 mg total) by mouth every 6 (six) hours as needed (SBP>160 or DBP>110). 01/12/23   Rhetta Mura, MD  insulin glargine-yfgn (SEMGLEE) 100 UNIT/ML injection Inject 0.2 mLs (20 Units total) into the skin daily. 01/13/23   Rhetta Mura, MD  insulin lispro (HUMALOG) 100 UNIT/ML injection Inject 0-14 Units into the skin in the morning, at noon, in the evening, and at bedtime. 201-250= 2 units 251-300= 4 units, 301-350= 6 units, 351-400= 8 units, 401-450 = 10 units, 451-500= 12 units. If >500 or high, give 14 units.    [provider]  MAGNESIUM PO Take 400 mg by mouth daily.    [provider]  Multiple Vitamins-Minerals (SENIOR MULTIVITAMIN PLUS  PO) Take 1 tablet by mouth daily with breakfast.    [provider]  nitroGLYCERIN (NITROSTAT) 0.4 MG SL tablet Place 0.4 mg under the tongue every 5 (five) minutes as needed for chest pain.    [provider]  nystatin (MYCOSTATIN/NYSTOP) powder Apply 1 Application topically 2 (two) times daily as needed (skin irritation). Apply to the groin in the morning and evening    [provider]  OXYGEN Inhale 2 L into the lungs See admin instructions. Inhale 2 L every shift for shortness of breath    [provider]  pantoprazole (PROTONIX) 40 MG tablet TAKE 1 TABLET  BY MOUTH EVERY DAY Patient taking differently: Take 40 mg by mouth in the morning. 11/25/19   [name], MD  polyethylene glycol (MIRALAX / GLYCOLAX) 17 g packet Take 17 g by mouth 2 (two) times daily as needed for mild constipation. 01/09/23   Almon Hercules, MD  senna-docusate (SENOKOT-S) 8.6-50 MG tablet Take 1 tablet by mouth 2 (two) times daily as needed for moderate constipation. 01/09/23   Almon Hercules, MD  tamsulosin (FLOMAX) 0.4 MG CAPS capsule Take 1 capsule (0.4 mg total) by mouth daily after breakfast. Patient taking differently: Take 0.4 mg by mouth at bedtime. 01/02/23   Lorin Glass, MD      Allergies    Cilostazol, Dulaglutide, Levofloxacin, Liraglutide, and Lisinopril    Review of Systems   Review of Systems Negative except as per HPI Physical Exam Updated Vital Signs BP (!) 96/57 (BP Location: Right Arm)   Pulse 80   Temp 97.8 F (36.6 C) (Oral)   Resp (!) 32   Ht 5\' 6"  (1.676 m)   Wt 85 kg   SpO2 98%   BMI 30.25 kg/m  Physical Exam Vitals and nursing note reviewed.  Constitutional:      General: He is not in acute distress.    Appearance: He is well-developed. He is not diaphoretic.  HENT:     Head: Normocephalic and atraumatic.     Mouth/Throat:     Mouth: Mucous membranes are dry.  Eyes:     Conjunctiva/sclera: Conjunctivae normal.  Cardiovascular:     Rate and Rhythm: Normal rate and regular rhythm.     Heart sounds: Normal heart sounds.  Pulmonary:     Effort: Tachypnea present.     Breath sounds: No decreased breath sounds or wheezing.  Abdominal:     Palpations: Abdomen is soft.     Tenderness: There is no abdominal tenderness.  Musculoskeletal:        General: No swelling, tenderness, deformity or signs of injury. Normal range of motion.     Cervical back: Normal range of motion and neck supple. No tenderness.     Right lower leg: No edema.     Left lower leg: No edema.  Skin:    General: Skin is warm and dry.     Findings: No erythema  or rash.  Neurological:     Mental Status: He is alert and oriented to person, place, and time.     Comments: Globally weak  Psychiatric:        Behavior: Behavior normal.     ED Results / Procedures / Treatments   Labs (all labs ordered are listed, but only abnormal results are displayed) Labs Reviewed  CBC WITH DIFFERENTIAL/PLATELET  COMPREHENSIVE METABOLIC PANEL  I-STAT ARTERIAL BLOOD GAS, ED    EKG None  Radiology No results found.  Procedures Procedures  {  Document cardiac monitor, telemetry assessment procedure when appropriate:1}  Medications Ordered in ED Medications - No data to display  ED Course/ Medical Decision Making/ A&P   {   Click here for ABCD2, HEART and other calculatorsREFRESH Note before signing :1}                          Medical Decision Making  This patient presents to the ED for concern of ***, this involves an extensive number of treatment options, and is a complaint that carries with it a high risk of complications and morbidity.  The differential diagnosis includes ***   Co morbidities that complicate the patient evaluation  ***   Additional history obtained:  Additional history obtained from *** External records from outside source obtained and reviewed including ***   Lab Tests:  I Ordered, and personally interpreted labs.  The pertinent results include:  ***   Imaging Studies ordered:  I ordered imaging studies including ***  I independently visualized and interpreted imaging which showed *** I agree with the radiologist interpretation   Cardiac Monitoring: / EKG:  The patient was maintained on a cardiac monitor.  I personally viewed and interpreted the cardiac monitored which showed an underlying rhythm of: ***   Consultations Obtained:  I requested consultation with the ***,  and discussed lab and imaging findings as well as pertinent plan - they recommend: ***   Problem List / ED Course / Critical  interventions / Medication management  *** I ordered medication including ***  for ***  Reevaluation of the patient after these medicines showed that the patient {resolved/improved/worsened:23923::"improved"} I have reviewed the patients home medicines and have made adjustments as needed   Social Determinants of Health:  ***   Test / Admission - Considered:  ***   {Document critical care time when appropriate:1} {Document review of labs and clinical decision tools ie heart score, Chads2Vasc2 etc:1}  {Document your independent review of radiology images, and any outside records:1} {Document your discussion with family members, caretakers, and with consultants:1} {Document social determinants of health affecting pt's care:1} {Document your decision making why or why not admission, treatments were needed:1} Final Clinical Impression(s) / ED Diagnoses Final diagnoses:  None    Rx / DC Orders ED Discharge Orders     None

## 2023-02-02 NOTE — ED Notes (Signed)
Pt arrived via ems from home after he fell out of bed. EMS reports he was hypotensive and gave NS pta . Pt hx of Myasthenia Gravis and states it causes bouts of hypotension. Pt a/o x 4 respirations rapid and shallow bp 96/57 all other vs wnl. Pt pending medical evaluation

## 2023-02-02 NOTE — Progress Notes (Signed)
NIF -15 VC 1.5L With good pt effort and participation.

## 2023-02-02 NOTE — ED Provider Notes (Incomplete)
EMERGENCY DEPARTMENT AT [facility] Provider Note   CSN: [id number] Arrival date & time: 02/02/23  2135     History {Add pertinent medical, surgical, social history, OB history to HPI:1} Chief Complaint  Patient presents with  . Hypotension    Arrived via ems after fell out of bed and found to be hypotensive hx of myastenia garavis ems vs 109/59 90 32 97%ra cbg 91 given NS pta    Fernando Hess is a 77 y.o. male.  77 year old male with history of myasthenia gravis, hypertension, hyperlipidemia, STEMI, CABG x 5, diabetes, CKD, CHF, on Eliquis brought in by EMS from home after fall.  Patient states that he was in his baseline state of health tonight around 730 when he went to bed and slipped on the sheet causing him to fall to the ground.  He has felt globally weak since that time, denies hitting his head or LOC.  States that he has pain in his right arm and his right leg similar to prior pains he has had without acute changes.  Reports that due to his myasthenia, he falls frequently.  He states that he does not always come to the hospital it really just depends on what the paramedics say when they come to check on him, tonight he was found to be hypotensive although blood pressure on arrival of EMS is unknown to the patient, per RN intake note- 109/59.       Home Medications Prior to Admission medications   Medication Sig Start Date End Date Taking? Authorizing Provider  apixaban (ELIQUIS) 5 MG TABS tablet Take 1 tablet (5 mg total) by mouth 2 (two) times daily. 01/01/23   Lorin Glass, MD  atorvastatin (LIPITOR) 80 MG tablet TAKE ONE TABLET BY MOUTH EVERY EVENING Patient taking differently: Take 80 mg by mouth at bedtime. 11/13/22   Weaver, Lorin Picket T, [name]  B-D UF III MINI PEN NEEDLES 31G X 5 MM MISC USE AS DIRECTED WITH LANTUS SOLOSTAR 04/02/19   [provider]  budesonide (PULMICORT) 0.25 MG/2ML nebulizer solution Take 2 mLs (0.25 mg total) by  nebulization 2 (two) times daily. 01/01/23   Lorin Glass, MD  buPROPion (WELLBUTRIN XL) 150 MG 24 hr tablet Take 150 mg by mouth every morning. 12/10/22   [provider]  Continuous Blood Gluc Sensor (FREESTYLE LIBRE 2 SENSOR) MISC Inject 1 Device into the skin every 14 (fourteen) days.    [provider]  DULoxetine (CYMBALTA) 30 MG capsule Take 1 capsule (30 mg total) by mouth every morning. 01/13/23   Rhetta Mura, MD  feeding supplement, GLUCERNA SHAKE, (GLUCERNA SHAKE) LIQD Take 237 mLs by mouth 3 (three) times daily between meals. 01/01/23   Lorin Glass, MD  hydrALAZINE (APRESOLINE) 25 MG tablet Take 1 tablet (25 mg total) by mouth every 6 (six) hours as needed (SBP>160 or DBP>110). 01/12/23   Rhetta Mura, MD  insulin glargine-yfgn (SEMGLEE) 100 UNIT/ML injection Inject 0.2 mLs (20 Units total) into the skin daily. 01/13/23   Rhetta Mura, MD  insulin lispro (HUMALOG) 100 UNIT/ML injection Inject 0-14 Units into the skin in the morning, at noon, in the evening, and at bedtime. 201-250= 2 units 251-300= 4 units, 301-350= 6 units, 351-400= 8 units, 401-450 = 10 units, 451-500= 12 units. If >500 or high, give 14 units.    [provider]  MAGNESIUM PO Take 400 mg by mouth daily.    [provider]  Multiple Vitamins-Minerals (SENIOR MULTIVITAMIN PLUS  PO) Take 1 tablet by mouth daily with breakfast.    [provider]  nitroGLYCERIN (NITROSTAT) 0.4 MG SL tablet Place 0.4 mg under the tongue every 5 (five) minutes as needed for chest pain.    [provider]  nystatin (MYCOSTATIN/NYSTOP) powder Apply 1 Application topically 2 (two) times daily as needed (skin irritation). Apply to the groin in the morning and evening    [provider]  OXYGEN Inhale 2 L into the lungs See admin instructions. Inhale 2 L every shift for shortness of breath    [provider]  pantoprazole (PROTONIX) 40 MG tablet TAKE 1 TABLET  BY MOUTH EVERY DAY Patient taking differently: Take 40 mg by mouth in the morning. 11/25/19   [name], MD  polyethylene glycol (MIRALAX / GLYCOLAX) 17 g packet Take 17 g by mouth 2 (two) times daily as needed for mild constipation. 01/09/23   Almon Hercules, MD  senna-docusate (SENOKOT-S) 8.6-50 MG tablet Take 1 tablet by mouth 2 (two) times daily as needed for moderate constipation. 01/09/23   Almon Hercules, MD  tamsulosin (FLOMAX) 0.4 MG CAPS capsule Take 1 capsule (0.4 mg total) by mouth daily after breakfast. Patient taking differently: Take 0.4 mg by mouth at bedtime. 01/02/23   Lorin Glass, MD      Allergies    Cilostazol, Dulaglutide, Levofloxacin, Liraglutide, and Lisinopril    Review of Systems   Review of Systems Negative except as per HPI Physical Exam Updated Vital Signs BP (!) 96/57 (BP Location: Right Arm)   Pulse 80   Temp 97.8 F (36.6 C) (Oral)   Resp (!) 32   Ht 5\' 6"  (1.676 m)   Wt 85 kg   SpO2 98%   BMI 30.25 kg/m  Physical Exam Vitals and nursing note reviewed.  Constitutional:      General: He is not in acute distress.    Appearance: He is well-developed. He is not diaphoretic.  HENT:     Head: Normocephalic and atraumatic.     Mouth/Throat:     Mouth: Mucous membranes are dry.  Eyes:     Conjunctiva/sclera: Conjunctivae normal.  Cardiovascular:     Rate and Rhythm: Normal rate and regular rhythm.     Heart sounds: Normal heart sounds.  Pulmonary:     Effort: Tachypnea present.     Breath sounds: No decreased breath sounds or wheezing.  Abdominal:     Palpations: Abdomen is soft.     Tenderness: There is no abdominal tenderness.  Musculoskeletal:        General: No swelling, tenderness, deformity or signs of injury. Normal range of motion.     Cervical back: Normal range of motion and neck supple. No tenderness.     Right lower leg: No edema.     Left lower leg: No edema.  Skin:    General: Skin is warm and dry.     Findings: No erythema  or rash.  Neurological:     Mental Status: He is alert and oriented to person, place, and time.     Comments: Globally weak  Psychiatric:        Behavior: Behavior normal.     ED Results / Procedures / Treatments   Labs (all labs ordered are listed, but only abnormal results are displayed) Labs Reviewed  CBC WITH DIFFERENTIAL/PLATELET  COMPREHENSIVE METABOLIC PANEL  I-STAT ARTERIAL BLOOD GAS, ED    EKG None  Radiology No results found.  Procedures Procedures  {  Document cardiac monitor, telemetry assessment procedure when appropriate:1}  Medications Ordered in ED Medications - No data to display  ED Course/ Medical Decision Making/ A&P   {   Click here for ABCD2, HEART and other calculatorsREFRESH Note before signing :1}                          Medical Decision Making Amount and/or Complexity of Data Reviewed Labs: ordered. Radiology: ordered.   This patient presents to the ED for concern of ***, this involves an extensive number of treatment options, and is a complaint that carries with it a high risk of complications and morbidity.  The differential diagnosis includes ***   Co morbidities that complicate the patient evaluation  ***   Additional history obtained:  Additional history obtained from *** External records from outside source obtained and reviewed including prior echo on file dated 12/30/22 with EF 60-65%   Lab Tests:  I Ordered, and personally interpreted labs.  The pertinent results include:  ***   Imaging Studies ordered:  I ordered imaging studies including ***  I independently visualized and interpreted imaging which showed *** I agree with the radiologist interpretation   Cardiac Monitoring: / EKG:  The patient was maintained on a cardiac monitor.  I personally viewed and interpreted the cardiac monitored which showed an underlying rhythm of: ***   Consultations Obtained:  I requested consultation with the ***,  and discussed  lab and imaging findings as well as pertinent plan - they recommend: ***   Problem List / ED Course / Critical interventions / Medication management  *** I ordered medication including ***  for ***  Reevaluation of the patient after these medicines showed that the patient {resolved/improved/worsened:23923::"improved"} I have reviewed the patients home medicines and have made adjustments as needed   Social Determinants of Health:  ***   Test / Admission - Considered:  ***   {Document critical care time when appropriate:1} {Document review of labs and clinical decision tools ie heart score, Chads2Vasc2 etc:1}  {Document your independent review of radiology images, and any outside records:1} {Document your discussion with family members, caretakers, and with consultants:1} {Document social determinants of health affecting pt's care:1} {Document your decision making why or why not admission, treatments were needed:1} Final Clinical Impression(s) / ED Diagnoses Final diagnoses:  None    Rx / DC Orders ED Discharge Orders     None

## 2023-02-03 DIAGNOSIS — W06XXXA Fall from bed, initial encounter: Secondary | ICD-10-CM | POA: Diagnosis not present

## 2023-02-03 DIAGNOSIS — E1169 Type 2 diabetes mellitus with other specified complication: Secondary | ICD-10-CM

## 2023-02-03 DIAGNOSIS — E113392 Type 2 diabetes mellitus with moderate nonproliferative diabetic retinopathy without macular edema, left eye: Secondary | ICD-10-CM | POA: Diagnosis present

## 2023-02-03 DIAGNOSIS — J9601 Acute respiratory failure with hypoxia: Secondary | ICD-10-CM | POA: Diagnosis present

## 2023-02-03 DIAGNOSIS — N179 Acute kidney failure, unspecified: Secondary | ICD-10-CM

## 2023-02-03 DIAGNOSIS — E1122 Type 2 diabetes mellitus with diabetic chronic kidney disease: Secondary | ICD-10-CM | POA: Diagnosis present

## 2023-02-03 DIAGNOSIS — I959 Hypotension, unspecified: Secondary | ICD-10-CM

## 2023-02-03 DIAGNOSIS — G7000 Myasthenia gravis without (acute) exacerbation: Secondary | ICD-10-CM | POA: Diagnosis present

## 2023-02-03 DIAGNOSIS — I2510 Atherosclerotic heart disease of native coronary artery without angina pectoris: Secondary | ICD-10-CM | POA: Diagnosis not present

## 2023-02-03 DIAGNOSIS — I5032 Chronic diastolic (congestive) heart failure: Secondary | ICD-10-CM | POA: Diagnosis present

## 2023-02-03 DIAGNOSIS — W19XXXA Unspecified fall, initial encounter: Secondary | ICD-10-CM

## 2023-02-03 DIAGNOSIS — E873 Alkalosis: Secondary | ICD-10-CM

## 2023-02-03 DIAGNOSIS — Z79899 Other long term (current) drug therapy: Secondary | ICD-10-CM | POA: Diagnosis not present

## 2023-02-03 DIAGNOSIS — E785 Hyperlipidemia, unspecified: Secondary | ICD-10-CM | POA: Diagnosis present

## 2023-02-03 DIAGNOSIS — E1151 Type 2 diabetes mellitus with diabetic peripheral angiopathy without gangrene: Secondary | ICD-10-CM | POA: Diagnosis present

## 2023-02-03 DIAGNOSIS — K219 Gastro-esophageal reflux disease without esophagitis: Secondary | ICD-10-CM | POA: Diagnosis present

## 2023-02-03 DIAGNOSIS — Z7901 Long term (current) use of anticoagulants: Secondary | ICD-10-CM | POA: Diagnosis not present

## 2023-02-03 DIAGNOSIS — Y92009 Unspecified place in unspecified non-institutional (private) residence as the place of occurrence of the external cause: Secondary | ICD-10-CM

## 2023-02-03 DIAGNOSIS — A419 Sepsis, unspecified organism: Secondary | ICD-10-CM | POA: Diagnosis not present

## 2023-02-03 DIAGNOSIS — N1832 Chronic kidney disease, stage 3b: Secondary | ICD-10-CM | POA: Diagnosis present

## 2023-02-03 DIAGNOSIS — E860 Dehydration: Secondary | ICD-10-CM | POA: Diagnosis present

## 2023-02-03 DIAGNOSIS — R0682 Tachypnea, not elsewhere classified: Secondary | ICD-10-CM | POA: Diagnosis not present

## 2023-02-03 DIAGNOSIS — S7001XA Contusion of right hip, initial encounter: Secondary | ICD-10-CM | POA: Diagnosis not present

## 2023-02-03 DIAGNOSIS — E1165 Type 2 diabetes mellitus with hyperglycemia: Secondary | ICD-10-CM | POA: Diagnosis present

## 2023-02-03 DIAGNOSIS — F0390 Unspecified dementia without behavioral disturbance: Secondary | ICD-10-CM | POA: Diagnosis present

## 2023-02-03 DIAGNOSIS — E1065 Type 1 diabetes mellitus with hyperglycemia: Secondary | ICD-10-CM | POA: Diagnosis not present

## 2023-02-03 DIAGNOSIS — G9341 Metabolic encephalopathy: Secondary | ICD-10-CM | POA: Diagnosis not present

## 2023-02-03 DIAGNOSIS — Z794 Long term (current) use of insulin: Secondary | ICD-10-CM

## 2023-02-03 DIAGNOSIS — I130 Hypertensive heart and chronic kidney disease with heart failure and stage 1 through stage 4 chronic kidney disease, or unspecified chronic kidney disease: Secondary | ICD-10-CM | POA: Diagnosis present

## 2023-02-03 DIAGNOSIS — R9431 Abnormal electrocardiogram [ECG] [EKG]: Secondary | ICD-10-CM

## 2023-02-03 DIAGNOSIS — Y92003 Bedroom of unspecified non-institutional (private) residence as the place of occurrence of the external cause: Secondary | ICD-10-CM | POA: Diagnosis not present

## 2023-02-03 DIAGNOSIS — R6520 Severe sepsis without septic shock: Secondary | ICD-10-CM

## 2023-02-03 DIAGNOSIS — R531 Weakness: Secondary | ICD-10-CM

## 2023-02-03 DIAGNOSIS — R6510 Systemic inflammatory response syndrome (SIRS) of non-infectious origin without acute organ dysfunction: Secondary | ICD-10-CM | POA: Diagnosis present

## 2023-02-03 DIAGNOSIS — S40011A Contusion of right shoulder, initial encounter: Secondary | ICD-10-CM | POA: Diagnosis not present

## 2023-02-03 DIAGNOSIS — Z881 Allergy status to other antibiotic agents status: Secondary | ICD-10-CM | POA: Diagnosis not present

## 2023-02-03 DIAGNOSIS — I25810 Atherosclerosis of coronary artery bypass graft(s) without angina pectoris: Secondary | ICD-10-CM | POA: Diagnosis present

## 2023-02-03 DIAGNOSIS — I480 Paroxysmal atrial fibrillation: Secondary | ICD-10-CM | POA: Diagnosis present

## 2023-02-03 DIAGNOSIS — I509 Heart failure, unspecified: Secondary | ICD-10-CM | POA: Diagnosis not present

## 2023-02-03 DIAGNOSIS — M25511 Pain in right shoulder: Secondary | ICD-10-CM | POA: Diagnosis present

## 2023-02-03 DIAGNOSIS — E876 Hypokalemia: Secondary | ICD-10-CM | POA: Diagnosis present

## 2023-02-03 DIAGNOSIS — G3289 Other specified degenerative disorders of nervous system in diseases classified elsewhere: Secondary | ICD-10-CM | POA: Diagnosis present

## 2023-02-03 DIAGNOSIS — Z951 Presence of aortocoronary bypass graft: Secondary | ICD-10-CM | POA: Diagnosis not present

## 2023-02-03 DIAGNOSIS — N189 Chronic kidney disease, unspecified: Secondary | ICD-10-CM

## 2023-02-03 DIAGNOSIS — I7121 Aneurysm of the ascending aorta, without rupture: Secondary | ICD-10-CM | POA: Diagnosis present

## 2023-02-03 LAB — BLOOD GAS, VENOUS
Acid-Base Excess: 1.7 mmol/L (ref 0.0–2.0)
Bicarbonate: 23.4 mmol/L (ref 20.0–28.0)
O2 Saturation: 89.5 %
Patient temperature: 37
pCO2, Ven: 28 mmHg — ABNORMAL LOW (ref 44–60)
pH, Ven: 7.53 — ABNORMAL HIGH (ref 7.25–7.43)
pO2, Ven: 53 mmHg — ABNORMAL HIGH (ref 32–45)

## 2023-02-03 LAB — CBC
HCT: 35.9 % — ABNORMAL LOW (ref 39.0–52.0)
Hemoglobin: 11.4 g/dL — ABNORMAL LOW (ref 13.0–17.0)
MCH: 28.9 pg (ref 26.0–34.0)
MCHC: 31.8 g/dL (ref 30.0–36.0)
MCV: 91.1 fL (ref 80.0–100.0)
Platelets: 269 10*3/uL (ref 150–400)
RBC: 3.94 MIL/uL — ABNORMAL LOW (ref 4.22–5.81)
RDW: 16 % — ABNORMAL HIGH (ref 11.5–15.5)
WBC: 12.5 10*3/uL — ABNORMAL HIGH (ref 4.0–10.5)
nRBC: 0 % (ref 0.0–0.2)

## 2023-02-03 LAB — URINALYSIS, ROUTINE W REFLEX MICROSCOPIC
Bacteria, UA: NONE SEEN
Bilirubin Urine: NEGATIVE
Glucose, UA: NEGATIVE mg/dL
Hgb urine dipstick: NEGATIVE
Ketones, ur: NEGATIVE mg/dL
Leukocytes,Ua: NEGATIVE
Nitrite: NEGATIVE
Protein, ur: 100 mg/dL — AB
Specific Gravity, Urine: 1.018 (ref 1.005–1.030)
pH: 6 (ref 5.0–8.0)

## 2023-02-03 LAB — CK: Total CK: 52 U/L (ref 49–397)

## 2023-02-03 LAB — CBG MONITORING, ED
Glucose-Capillary: 90 mg/dL (ref 70–99)
Glucose-Capillary: 158 mg/dL — ABNORMAL HIGH (ref 70–99)
Glucose-Capillary: 148 mg/dL — ABNORMAL HIGH (ref 70–99)

## 2023-02-03 LAB — LACTIC ACID, PLASMA
Lactic Acid, Venous: 1.6 mmol/L (ref 0.5–1.9)
Lactic Acid, Venous: 2.5 mmol/L (ref 0.5–1.9)
Lactic Acid, Venous: 3.1 mmol/L (ref 0.5–1.9)

## 2023-02-03 LAB — GLUCOSE, CAPILLARY
Glucose-Capillary: 198 mg/dL — ABNORMAL HIGH (ref 70–99)
Glucose-Capillary: 250 mg/dL — ABNORMAL HIGH (ref 70–99)

## 2023-02-03 LAB — BASIC METABOLIC PANEL
Anion gap: 9 (ref 5–15)
BUN: 31 mg/dL — ABNORMAL HIGH (ref 8–23)
CO2: 25 mmol/L (ref 22–32)
Calcium: 8.2 mg/dL — ABNORMAL LOW (ref 8.9–10.3)
Chloride: 109 mmol/L (ref 98–111)
Creatinine, Ser: 2 mg/dL — ABNORMAL HIGH (ref 0.61–1.24)
GFR, Estimated: 34 mL/min — ABNORMAL LOW (ref >60)
Glucose, Bld: 96 mg/dL (ref 70–99)
Potassium: 3.1 mmol/L — ABNORMAL LOW (ref 3.5–5.1)
Sodium: 143 mmol/L (ref 135–145)

## 2023-02-03 LAB — SALICYLATE LEVEL: Salicylate Lvl: <7 mg/dL — ABNORMAL LOW (ref 7.0–30.0)

## 2023-02-03 MED ORDER — POTASSIUM CHLORIDE CRYS ER 20 MEQ PO TBCR
40.0000 meq | EXTENDED_RELEASE_TABLET | ORAL | Status: AC
  Administered 2023-02-03: 40 meq via ORAL
  Filled 2023-02-03: qty 2

## 2023-02-03 MED ORDER — ALBUTEROL SULFATE (2.5 MG/3ML) 0.083% IN NEBU: 2.5000 mg | INHALATION_SOLUTION | RESPIRATORY_TRACT | Status: DC | PRN

## 2023-02-03 MED ORDER — INSULIN ASPART 100 UNIT/ML IJ SOLN
0.0000 [IU] | Freq: Every day | INTRAMUSCULAR | Status: DC
  Administered 2023-02-03: 2 [IU] via SUBCUTANEOUS

## 2023-02-03 MED ORDER — SODIUM CHLORIDE 0.9 % IV SOLN
1.0000 g | Freq: Once | INTRAVENOUS | Status: AC
  Administered 2023-02-03: 1 g via INTRAVENOUS
  Filled 2023-02-03: qty 10

## 2023-02-03 MED ORDER — LACTATED RINGERS IV SOLN: INTRAVENOUS | Status: AC

## 2023-02-03 MED ORDER — TRIMETHOBENZAMIDE HCL 100 MG/ML IM SOLN: 200.0000 mg | Freq: Four times a day (QID) | INTRAMUSCULAR | Status: DC | PRN

## 2023-02-03 MED ORDER — SODIUM CHLORIDE 0.9% FLUSH
3.0000 mL | Freq: Two times a day (BID) | INTRAVENOUS | Status: DC
  Administered 2023-02-03 – 2023-02-08 (×9): 3 mL via INTRAVENOUS

## 2023-02-03 MED ORDER — CALCIUM GLUCONATE-NACL 1-0.675 GM/50ML-% IV SOLN
1.0000 g | Freq: Once | INTRAVENOUS | Status: AC
  Administered 2023-02-03: 1000 mg via INTRAVENOUS
  Filled 2023-02-03: qty 50

## 2023-02-03 MED ORDER — APIXABAN 5 MG PO TABS
5.0000 mg | ORAL_TABLET | Freq: Two times a day (BID) | ORAL | Status: DC
  Administered 2023-02-03 – 2023-02-08 (×11): 5 mg via ORAL
  Filled 2023-02-03 (×11): qty 1

## 2023-02-03 MED ORDER — ACETAMINOPHEN 325 MG PO TABS
650.0000 mg | ORAL_TABLET | Freq: Four times a day (QID) | ORAL | Status: DC | PRN
  Administered 2023-02-03 – 2023-02-07 (×8): 650 mg via ORAL
  Filled 2023-02-03 (×8): qty 2

## 2023-02-03 MED ORDER — SODIUM CHLORIDE 0.9 % IV BOLUS
500.0000 mL | Freq: Once | INTRAVENOUS | Status: AC
  Administered 2023-02-03: 500 mL via INTRAVENOUS

## 2023-02-03 MED ORDER — ACETAMINOPHEN 650 MG RE SUPP: 650.0000 mg | Freq: Four times a day (QID) | RECTAL | Status: DC | PRN

## 2023-02-03 MED ORDER — INSULIN ASPART 100 UNIT/ML IJ SOLN
0.0000 [IU] | Freq: Three times a day (TID) | INTRAMUSCULAR | Status: DC
  Administered 2023-02-03: 3 [IU] via SUBCUTANEOUS
  Administered 2023-02-03: 2 [IU] via SUBCUTANEOUS
  Administered 2023-02-04: 5 [IU] via SUBCUTANEOUS
  Administered 2023-02-04: 8 [IU] via SUBCUTANEOUS
  Administered 2023-02-04: 3 [IU] via SUBCUTANEOUS
  Administered 2023-02-05 – 2023-02-06 (×4): 5 [IU] via SUBCUTANEOUS
  Administered 2023-02-06: 3 [IU] via SUBCUTANEOUS
  Administered 2023-02-06 – 2023-02-07 (×2): 5 [IU] via SUBCUTANEOUS
  Administered 2023-02-07: 2 [IU] via SUBCUTANEOUS
  Administered 2023-02-07: 5 [IU] via SUBCUTANEOUS
  Administered 2023-02-08 (×2): 3 [IU] via SUBCUTANEOUS

## 2023-02-03 MED ORDER — SODIUM CHLORIDE 0.9 % IV SOLN
1.0000 g | INTRAVENOUS | Status: DC
  Administered 2023-02-04 – 2023-02-06 (×3): 1 g via INTRAVENOUS
  Filled 2023-02-03 (×3): qty 10

## 2023-02-03 NOTE — Progress Notes (Signed)
NIF - 19 VC 1.35L  Good patient effort.

## 2023-02-03 NOTE — Evaluation (Signed)
Physical Therapy Evaluation Patient Details Name: Fernando Hess MRN: [id number] DOB: [birth year] Today's Date: 02/03/2023  History of Present Illness  77 y.o. male presents to [facility] hospital on 02/02/2023 after falling ou of bed. 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  Clinical Impression  Pt presents to PT with deficits in strength, power, gait, balance, functional mobility, endurance, cardiopulmonary function. Pt is tachypneic at rest and with all mobility, utilizing accessory muscles to breath throughout session. Despite this PT is able to wean supplemental oxygen to room air with pt maintaining sats well when ambulating. Pt is generally weak, however with the amount of energy he must be expending with his current respiratory rate and pattern it is understandable how any form of physical activity is exhausting. Pt will benefit from continued attempts at frequent mobilization in an effort to improve strength, balance, and gait quality. Pt is against potential discharge with inpatient PT placement. PT thus recommends discharge home with HHPT.       Recommendations for follow up therapy are one component of a multi-disciplinary discharge planning process, led by the attending physician.  Recommendations may be updated based on patient status, additional functional criteria and insurance authorization.  Follow Up Recommendations Can patient physically be transported by private vehicle: Yes     Assistance Recommended at Discharge Intermittent Supervision/Assistance  Patient can return home with the following  A little help with walking and/or transfers;A little help with bathing/dressing/bathroom;Assistance with cooking/housework;Assist for transportation;Help with stairs or ramp for entrance    Equipment Recommendations None recommended by PT  Recommendations for Other Services        Functional Status Assessment Patient has had a recent decline in their functional status and demonstrates the ability to make significant improvements in function in a reasonable and predictable amount of time.     Precautions / Restrictions Precautions Precautions: Fall Precaution Comments: tachypnea Restrictions Weight Bearing Restrictions: No      Mobility  Bed Mobility Overal bed mobility: Needs Assistance Bed Mobility: Supine to Sit, Sit to Supine     Supine to sit: Min guard, HOB elevated Sit to supine: Min guard        Transfers Overall transfer level: Needs assistance Equipment used: Rollator (4 wheels) Transfers: Sit to/from Stand Sit to Stand: Min guard           General transfer comment: verbal cues for hand placement    Ambulation/Gait Ambulation/Gait assistance: Min guard Gait Distance (Feet): 60 Feet Assistive device: Rollator (4 wheels) Gait Pattern/deviations: Step-to pattern Gait velocity: reduced Gait velocity interpretation: <1.31 ft/sec, indicative of household ambulator   General Gait Details: slowed step-to gait, reduced foot clearance bilaterally  Stairs            Wheelchair Mobility    Modified Rankin (Stroke Patients Only)       Balance Overall balance assessment: Needs assistance Sitting-balance support: No upper extremity supported, Feet supported Sitting balance-Leahy Scale: Good     Standing balance support: Bilateral upper extremity supported, Reliant on assistive device for balance Standing balance-Leahy Scale: Poor                               Pertinent Vitals/Pain Pain Assessment Pain Assessment: No/denies pain    Home Living Family/patient expects to be discharged to:: Private residence Living Arrangements: Spouse/significant other Available Help at Discharge: Family;Available 24  hours/day (spouse unable to provide considerable physical assistance) Type of Home: House Home Access: Ramped  entrance       Home Layout: Two level;Able to live on main level with bedroom/bathroom (does report pt's spouse may be buying a stair lift) Home Equipment: Rollator (4 wheels);Shower seat;Grab bars - tub/shower;BSC/3in1;Wheelchair - manual      Prior Function Prior Level of Function : Needs assist             Mobility Comments: ambulatory with rollator for short household distances. history of frequent falls       Hand Dominance        Extremity/Trunk Assessment   Upper Extremity Assessment Upper Extremity Assessment: Generalized weakness    Lower Extremity Assessment Lower Extremity Assessment: Generalized weakness    Cervical / Trunk Assessment Cervical / Trunk Assessment: Kyphotic  Communication   Communication: No difficulties  Cognition Arousal/Alertness: Awake/alert   Overall Cognitive Status: No family/caregiver present to determine baseline cognitive functioning                                 General Comments: pt is alert and oridented, some slowed processing noted.        General Comments General comments (skin integrity, edema, etc.): pt on 1.5L [place] upon PT arrival, PT weans to room air with stable sats in high 90s with all activity. Pt presents with tachypnea at rest and with activity. PT notes use of accessory muscles at all times.    Exercises     Assessment/Plan    PT Assessment Patient needs continued PT services  PT Problem List Decreased strength;Decreased activity tolerance;Decreased balance;Decreased mobility;Decreased knowledge of use of DME;Cardiopulmonary status limiting activity       PT Treatment Interventions Therapeutic exercise;Balance training;Neuromuscular re-education;Cognitive remediation;Patient/family education;Gait training;Therapeutic activities;Functional mobility training    PT Goals (Current goals can be found in the Care Plan section)  Acute Rehab PT Goals Patient Stated Goal: to improve activity  tolerance PT Goal Formulation: With patient Time For Goal Achievement: 02/17/23 Potential to Achieve Goals: Fair Additional Goals Additional Goal #1: Pt will report 2/4 DOE or less when ambulating for >100' to demonstrate improved activity tolerance    Frequency Min 3X/week     Co-evaluation               AM-PAC PT "6 Clicks" Mobility  Outcome Measure Help needed turning from your back to your side while in a flat bed without using bedrails?: A Little Help needed moving from lying on your back to sitting on the side of a flat bed without using bedrails?: A Little Help needed moving to and from a bed to a chair (including a wheelchair)?: A Little Help needed standing up from a chair using your arms (e.g., wheelchair or bedside chair)?: A Little Help needed to walk in hospital room?: A Little Help needed climbing 3-5 steps with a railing? : Total 6 Click Score: 16    End of Session Equipment Utilized During Treatment: Gait belt Activity Tolerance: Patient limited by fatigue Patient left: in bed;with call bell/phone within reach;with bed alarm set Nurse Communication: Mobility status PT Visit Diagnosis: Other abnormalities of gait and mobility (R26.89);Muscle weakness (generalized) (M62.81);Other (comment)    Time: 9604-5409 PT Time Calculation (min) (ACUTE ONLY): 30 min   Charges:   PT Evaluation $PT Eval Moderate Complexity: 1 Mod          Arlyss Gandy,  PT, DPT Acute Rehabilitation Office [phone]   Arlyss Gandy 02/03/2023, 4:57 PM

## 2023-02-03 NOTE — Consult Note (Signed)
NAME:  Fernando Hess, MRN:  [id number], DOB:  [birth year], LOS: 0 ADMISSION DATE:  02/02/2023, CONSULTATION DATE:  02/03/23 REFERRING MD:  ED , CHIEF COMPLAINT:  fall  History of Present Illness:  77 yo man with hx of dementia, CAD, diastolic CHF, OSA and hypoxic RF on 2 L, paroxysmal A-fib, CKD-3B, DM-2 and recent right hip fracture , brought to ED after fall.   Was hypotensive temporarily.      I was called to evaluate patient for tachypnea, low NIF, weakness.   It seems there was some suggestion of a myesthenia gravis diagnosis at some point.  Recent notes from admission from 4/24-4/28 indicate its unclear if actually diagnosed (sees op neuro who manages OSA, I don't see any antibody testing or nm testing on review of results or neurology notes).       Pertinent  Medical History  dementia, CAD, diastolic CHF, OSA and hypoxic RF on 2 L, paroxysmal A-fib, CKD-3B, DM-2  Significant Hospital Events: Including procedures, antibiotic start and stop dates in addition to other pertinent events     Interim History / Subjective:   Objective   Blood pressure (!) 142/76, pulse 89, temperature 97.9 F (36.6 C), temperature source Oral, resp. rate 16, height 5\' 6"  (1.676 m), weight 85 kg, SpO2 96 %.        Intake/Output Summary (Last 24 hours) at 02/03/2023 4098 Last data filed at 02/03/2023 0227 Gross per 24 hour  Intake 1100 ml  Output --  Net 1100 ml   Filed Weights   02/02/23 2153  Weight: 85 kg    Examination: General: intermittently tachypneic  HENT: ncat  Lungs: ctab  Cardiovascular: RRR no mgr  Abdomen: nt, nd, nbs  Extremities: no edema Neuro: oriented x 1 GU:   Resolved Hospital Problem list     Assessment & Plan:  Tachypnea, weakness:   ABG shows CO2 level in 30s, good oxygen saturation, cxr appears normal.   Patient is breathing in high 20s, but is able to slow down with good effort when asked.  Does not look consistent with MG picture to me.  I don't suspect  that he is as high risk for respiratory decompensation and intubation.   Could be an anxiety or psych component to tachypnea, as he does at least appear to have some component of anxiety.  Patient says he has been chronically sob for several years.  Isolated NIF of -15 is less concerning to me in the setting of some underlying dementia, normal ABG showing no resp insufficiency, and no clear MG diagnosis.    Given the above, I don't think ICU admission is warranted at this time.   If mg workup has not been done I think reasonable to do, consider official neuro consult for recurrent falls, weakness (patient says over years but he seems somewhat questionable historian).   If concern for resp insufficiency, could repeat ABG in several hours, could also do end tidal CO2 to monitor for changes.    Please call us back if needed, will sign off now.    Labs   CBC: Recent Labs  Lab 02/02/23 2245 02/02/23 2316 02/03/23 0613  WBC 12.9*  --  12.5*  NEUTROABS 9.5*  --   --   HGB 11.8* 12.2* 11.4*  HCT 36.5* 36.0* 35.9*  MCV 90.8  --  91.1  PLT 310  --  269    Basic Metabolic Panel: Recent Labs  Lab 02/02/23 2245 02/02/23 2316 02/03/23 1191  NA 143 144 143  K 3.7 3.6 3.1*  CL 107  --  109  CO2 25  --  25  GLUCOSE 136*  --  96  BUN 33*  --  31*  CREATININE 2.25*  --  2.00*  CALCIUM 8.6*  --  8.2*   GFR: Estimated Creatinine Clearance: 31.1 mL/min (A) (by C-G formula based on SCr of 2 mg/dL (H)). Recent Labs  Lab 02/02/23 2245 02/02/23 2352 02/03/23 0156 02/03/23 0613  WBC 12.9*  --   --  12.5*  LATICACIDVEN  --  3.1* 2.5* 1.6    Liver Function Tests: Recent Labs  Lab 02/02/23 2245  AST 32  ALT 24  ALKPHOS 122  BILITOT 0.7  PROT 5.3*  ALBUMIN 2.6*   No results for input(s): "LIPASE", "AMYLASE" in the last 168 hours. No results for input(s): "AMMONIA" in the last 168 hours.  ABG    Component Value Date/Time   PHART 7.434 10/03/2015 0746   PCO2ART 36.4 10/03/2015  0746   PO2ART 72.0 (L) 10/03/2015 0746   HCO3 23.4 02/03/2023 0157   TCO2 27 02/02/2023 2316   ACIDBASEDEF 1.0 06/06/2021 0956   O2SAT 89.5 02/03/2023 0157     Coagulation Profile: No results for input(s): "INR", "PROTIME" in the last 168 hours.  Cardiac Enzymes: No results for input(s): "CKTOTAL", "CKMB", "CKMBINDEX", "TROPONINI" in the last 168 hours.  HbA1C: Hgb A1c MFr Bld  Date/Time Value Ref Range Status  09/18/2022 12:23 AM 10.7 (H) 4.8 - 5.6 % Final    Comment:    (NOTE)         Prediabetes: 5.7 - 6.4         Diabetes: >6.4         Glycemic control for adults with diabetes: <7.0   08/13/2022 05:29 PM 14.6 (H) 4.8 - 5.6 % Final    Comment:    (NOTE)         Prediabetes: 5.7 - 6.4         Diabetes: >6.4         Glycemic control for adults with diabetes: <7.0     CBG: No results for input(s): "GLUCAP" in the last 168 hours.  Review of Systems:   Unable to assess  Past Medical History:  He,  has a past medical history of Acute renal failure (ARF) ([name]) (09/24/2015), Acute stress disorder (11/13/2021), Atrial premature complexes, CAD (coronary artery disease) of artery bypass graft, CAD (coronary artery disease), native coronary artery (2017), CHF (congestive heart failure) ([name]), CKD (chronic kidney disease), stage III ([name]), Contusion of right knee (03/16/2020), Dyspnea, Elevated troponin, Erectile dysfunction, Esophageal reflux, History of atrial fibrillation, History of kidney stones, History of non-ST elevation myocardial infarction (NSTEMI) (09/24/2015), History of ST elevation myocardial infarction (STEMI) (10/22/2015), Hyperlipidemia, Hypertension, Left ureteral stone, Mild atherosclerosis of both carotid arteries, Nephrolithiasis, OSA (obstructive sleep apnea), Peripheral artery disease, RBBB (right bundle branch block), Renal atrophy, right, Sleep apnea, ST elevation myocardial infarction (STEMI) of inferior wall ([name]) (10/22/2015), Type 2 diabetes mellitus  treated with insulin ([name]), 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   Surgical History:   Past Surgical History:  Procedure Laterality Date   APPENDECTOMY  1965   CARDIAC CATHETERIZATION N/A 09/26/2015   Procedure: Left Heart Cath and Coronary Angiography;  Surgeon: Lennette Bihari, MD;  Location: [facility] INVASIVE CV LAB;  Service: Cardiovascular;  Laterality: N/A;   CARDIAC CATHETERIZATION  N/A 10/22/2015   Procedure: Left Heart Cath and Coronary Angiography;  Surgeon: Tonny Bollman, MD;  Location: [facility] INVASIVE CV LAB;  Service: Cardiovascular;  Laterality: N/A;   CATARACT EXTRACTION W/ INTRAOCULAR LENS  IMPLANT, BILATERAL  2017   COLONOSCOPY     CORONARY ARTERY BYPASS GRAFT N/A 10/02/2015   Procedure: CORONARY ARTERY BYPASS GRAFTING (CABG) X5 LIMA-LAD; SVG-DIAG; SVG-OM; SVG-PD; SVG-RAMUS TRANSESOPHAGEAL ECHOCARDIOGRAM (TEE) ENDOSCOPIC GREATER SAPHENOUS VEIN  HARVEST BILAT LE;  Surgeon: Kerin Perna, MD;  Location: [facility] OR;  Service: Open Heart Surgery;  Laterality: N/A;   CYSTOSCOPY/URETEROSCOPY/HOLMIUM LASER/STENT PLACEMENT Left 08/10/2018   Procedure: CYSTOSCOPY/URETEROSCOPY/HOLMIUM LASER/STENT PLACEMENT;  Surgeon: Jerilee Field, MD;  Location: [facility];  Service: Urology;  Laterality: Left;   CYSTOSCOPY/URETEROSCOPY/HOLMIUM LASER/STENT PLACEMENT Left 09/10/2018   Procedure: CYSTOSCOPY/URETEROSCOPY/HOLMIUM LASER/STENT EXCHANGE;  Surgeon: Jerilee Field, MD;  Location: [facility]S;  Service: Urology;  Laterality: Left;   HIP ARTHROPLASTY Right 12/26/2022   Procedure: RIGHT HEMI HIP ARTHROPLASTY;  Surgeon: Joen Laura, MD;  Location: [facility] OR;  Service: Orthopedics;  Laterality: Right;   LEFT HEART CATHETERIZATION WITH CORONARY ANGIOGRAM N/A 04/13/2014   Procedure: LEFT HEART CATHETERIZATION WITH CORONARY ANGIOGRAM;  Surgeon: Othella Boyer, MD;  Location: [facility] CATH LAB;   Service: Cardiovascular;  Laterality: N/A;   LEG SURGERY Right age 39   closed reduction leg fracture   NASAL SEPTOPLASTY W/ TURBINOPLASTY Bilateral 08/30/2021   Procedure: NASAL SEPTOPLASTY WITH BILATERAL INFERIOR TURBINATE REDUCTION;  Surgeon: Osborn Coho, MD;  Location: [facility] OR;  Service: ENT;  Laterality: Bilateral;   POLYPECTOMY     RIGHT HEART CATH N/A 06/06/2021   Procedure: RIGHT HEART CATH;  Surgeon: Dolores Patty, MD;  Location: [facility] INVASIVE CV LAB;  Service: Cardiovascular;  Laterality: N/A;   TEE WITHOUT CARDIOVERSION N/A 10/02/2015   Procedure: TRANSESOPHAGEAL ECHOCARDIOGRAM (TEE);  Surgeon: Kerin Perna, MD;  [facility];  Service: Open Heart Surgery;  Laterality: N/A;   URETEROSCOPY WITH HOLMIUM LASER LITHOTRIPSY Bilateral 2004;  2005  dr grapey  @[facility]    VASECTOMY       Social History:   reports that he has never smoked. He has never used smokeless tobacco. He reports that he does not currently use alcohol. He reports that he does not use drugs.   Family History:  His family history includes Diabetes in his brother and brother; Heart attack in his father and mother; Pancreatic cancer in his brother. There is no history of Colon cancer, Esophageal cancer, Prostate cancer, Rectal cancer, Stomach cancer, or Colon polyps.   Allergies Allergies  Allergen Reactions   Cilostazol Swelling and Other (See Comments)    Edema    Dulaglutide Nausea And Vomiting and Other (See Comments)    TRULICITY   Levofloxacin Hives, Itching and Rash   Liraglutide Other (See Comments)    Severe fatigue & insomnia   Lisinopril Itching, Rash and Cough     Home Medications  Prior to Admission medications   Medication Sig Start Date End Date Taking? Authorizing Provider  apixaban (ELIQUIS) 5 MG TABS tablet Take 1 tablet (5 mg total) by mouth 2 (two) times daily. 01/01/23   Lorin Glass, MD  atorvastatin (LIPITOR) 80 MG tablet TAKE ONE TABLET BY MOUTH EVERY EVENING Patient taking  differently: Take 80 mg by mouth at bedtime. 11/13/22   Tereso Newcomer T, [name]  B-D UF III MINI PEN NEEDLES 31G X 5 MM MISC USE AS DIRECTED WITH LANTUS SOLOSTAR 04/02/19   [provider]  budesonide (PULMICORT) 0.25 MG/2ML nebulizer solution Take 2 mLs (0.25 mg total) by nebulization 2 (two) times daily. 01/01/23   Lorin Glass, MD  buPROPion (WELLBUTRIN XL) 150 MG 24 hr tablet Take 150 mg by mouth every morning. 12/10/22   [provider]  Continuous Blood Gluc Sensor (FREESTYLE LIBRE 2 SENSOR) MISC Inject 1 Device into the skin every 14 (fourteen) days.    [provider]  DULoxetine (CYMBALTA) 30 MG capsule Take 1 capsule (30 mg total) by mouth every morning. 01/13/23   Rhetta Mura, MD  feeding supplement, GLUCERNA SHAKE, (GLUCERNA SHAKE) LIQD Take 237 mLs by mouth 3 (three) times daily between meals. 01/01/23   Lorin Glass, MD  hydrALAZINE (APRESOLINE) 25 MG tablet Take 1 tablet (25 mg total) by mouth every 6 (six) hours as needed (SBP>160 or DBP>110). 01/12/23   Rhetta Mura, MD  insulin glargine-yfgn (SEMGLEE) 100 UNIT/ML injection Inject 0.2 mLs (20 Units total) into the skin daily. 01/13/23   Rhetta Mura, MD  insulin lispro (HUMALOG) 100 UNIT/ML injection Inject 0-14 Units into the skin in the morning, at noon, in the evening, and at bedtime. 201-250= 2 units 251-300= 4 units, 301-350= 6 units, 351-400= 8 units, 401-450 = 10 units, 451-500= 12 units. If >500 or high, give 14 units.    [provider]  MAGNESIUM PO Take 400 mg by mouth daily.    [provider]  Multiple Vitamins-Minerals (SENIOR MULTIVITAMIN PLUS PO) Take 1 tablet by mouth daily with breakfast.    [provider]  nitroGLYCERIN (NITROSTAT) 0.4 MG SL tablet Place 0.4 mg under the tongue every 5 (five) minutes as needed for chest pain.    [provider]  nystatin (MYCOSTATIN/NYSTOP) powder Apply 1 Application topically 2 (two) times daily as  needed (skin irritation). Apply to the groin in the morning and evening    [provider]  OXYGEN Inhale 2 L into the lungs See admin instructions. Inhale 2 L every shift for shortness of breath    [provider]  pantoprazole (PROTONIX) 40 MG tablet TAKE 1 TABLET BY MOUTH EVERY DAY Patient taking differently: Take 40 mg by mouth in the morning. 11/25/19   [name], MD  polyethylene glycol (MIRALAX / GLYCOLAX) 17 g packet Take 17 g by mouth 2 (two) times daily as needed for mild constipation. 01/09/23   Almon Hercules, MD  senna-docusate (SENOKOT-S) 8.6-50 MG tablet Take 1 tablet by mouth 2 (two) times daily as needed for moderate constipation. 01/09/23   Almon Hercules, MD  tamsulosin (FLOMAX) 0.4 MG CAPS capsule Take 1 capsule (0.4 mg total) by mouth daily after breakfast. Patient taking differently: Take 0.4 mg by mouth at bedtime. 01/02/23   Lorin Glass, MD     Critical care time: 35 min

## 2023-02-03 NOTE — Progress Notes (Signed)
Pt performed NIF -19 and VC 1.54L.

## 2023-02-03 NOTE — H&P (Signed)
History and Physical    Patient: Fernando Hess ZOX:[id number] DOB: [birth year] DOA: 02/02/2023 DOS: the patient was seen and examined on 02/03/2023 PCP: [name], MD  Patient coming from: Home via EMS  Chief Complaint:  Chief Complaint  Patient presents with   Hypotension    Arrived via ems after fell out of bed and found to be hypotensive hx of myastenia garavis ems vs 109/59 90 32 97%ra cbg 91 given NS pta   HPI: Fernando Hess is a 77 y.o. male with medical history significant of paroxysmal atrial fibrillation, diastolic CHF, CADs/p CABG, diabetes mellitus type 2, CKD stage 3b, anxiety, reported history myasthenia gravis, recent right hip fracture, dementia, and OSA oxygen who presents after falling out of bed.  History is initially obtained from the patient who appears to be alert and oriented x 3.  He states that at baseline he lives with his wife at home and ambulates with use of a rolling walker and is not on oxygen at baseline.  He states that he had been in his normal state of health and was trying to get in bed last night when slipped off the mattress causing him to fall.  Denied any trauma to his head or loss of consciousness.  He denies having any recent fever, chills, nausea, vomiting, diarrhea, or dysuria.  Patient reports that his breathing is often affected from myasthenia gravis which she has had since the age of 36 and was diagnosed by Dr. [name] at [facility] in [place].     Wife notes that that he was not on oxygen at home, but had been placed on oxygen previously while at rest home. Patient previously had been on CPAP at night previously, but his machine broke about 3 years ago  and was not longer covered by insurance. He does not have the antibody in his blood like others with the condition of myasthenia gravis. They had previously tried on infusions twice last year.  She had found him yesterday evening on the side of the bed and thought that he  likely slid down onto his bottom.  She reports that he fall also almost daily and she has to call EMS out in order to get him up.  Patient had fallen back in April fractured his right hip requiring right hip hemiarthroplasty. Yesterday patient seemed to be disoriented following the fall when she was asking him questions which made her more concerned.  She felt like he could possibly be dehydrated.  EMS had reported the patient to be hypotensive and he had been given 700 mL of normal saline IV fluids prior to arrival.  In the emergency department patient was noted to be afebrile with respirations into the 40s, blood pressure as low as 96/57 with improvement up to 155/78 with IV fluids, and O2 saturations maintained on room air.  Initial pH did not show any signs of hypercapnia labs from 5/20 significant for WBC 12.9, BUN 33, creatinine 2.25, calcium 8.6, ionized calcium 1.13, albumin 2.6, and initial lactic acid 3.1.  Chest x-ray showed no active disease. CT scan of the head did not note any acute abnormality.  Blood cultures had been obtained.  Urinalysis was ordered.  PCCM had evaluated the patient due to questionable history of myasthenia gravis with tachypnea, low NIF, and weakness patient had been given 1 L of lactated Ringer's and  Rocephin IV.     Review of Systems: As mentioned in the history of present illness. All  other systems reviewed and are negative. Past Medical History:  Diagnosis Date   Acute renal failure (ARF) ([name]) 09/24/2015   Acute stress disorder 11/13/2021   Atrial premature complexes    CAD (coronary artery disease) of artery bypass graft    Early occlusion of saphenous vein graft to intermediate and marginal branch in February 2007 following bypass grafting    CAD (coronary artery disease), native coronary artery 2017   hx NSTEMI 09-24-2015  s/p  CABG x5 on 10-02-2015;  post op STEMI inferolateral wall,  SVG OM1 and SVG OM2 occluded, distal OM occlusion the calpruit, treated  medically // Myoview 7/21: no ischemia, EF 65, low risk   CHF (congestive heart failure) ([name])    CKD (chronic kidney disease), stage III ([name])    Contusion of right knee 03/16/2020   Dyspnea    Elevated troponin    Erectile dysfunction    Esophageal reflux    History of atrial fibrillation    post op CABG 10-02-2015   History of kidney stones    History of non-ST elevation myocardial infarction (NSTEMI) 09/24/2015   s/p  CABG x5   History of ST elevation myocardial infarction (STEMI) 10/22/2015   inferior wall,  post op CABG 10-02-2015   Hyperlipidemia    Hypertension    Left ureteral stone    Mild atherosclerosis of both carotid arteries    Nephrolithiasis    per CT bilateral non-obstructive calculi   OSA (obstructive sleep apnea)    Peripheral artery disease    LE Arterial US 01/2019: R PTA and ATA occluded; L ATA occluded   RBBB (right bundle branch block)    Renal atrophy, right    Sleep apnea    wears cpap    ST elevation myocardial infarction (STEMI) of inferior wall ([name]) 10/22/2015   Type 2 diabetes mellitus treated with insulin ([name])    followed by pcp   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) 03/01/2008   Wears glasses    Past Surgical History:  Procedure Laterality Date   APPENDECTOMY  1965   CARDIAC CATHETERIZATION N/A 09/26/2015   Procedure: Left Heart Cath and Coronary Angiography;  Surgeon: Lennette Bihari, MD;  Location: [facility] INVASIVE CV LAB;  Service: Cardiovascular;  Laterality: N/A;   CARDIAC CATHETERIZATION N/A 10/22/2015   Procedure: Left Heart Cath and Coronary Angiography;  Surgeon: Tonny Bollman, MD;  Location: [facility] INVASIVE CV LAB;  Service: Cardiovascular;  Laterality: N/A;   CATARACT EXTRACTION W/ INTRAOCULAR LENS  IMPLANT, BILATERAL  2017   COLONOSCOPY     CORONARY ARTERY BYPASS GRAFT N/A 10/02/2015   Procedure: CORONARY ARTERY BYPASS GRAFTING (CABG) X5 LIMA-LAD; SVG-DIAG;  SVG-OM; SVG-PD; SVG-RAMUS TRANSESOPHAGEAL ECHOCARDIOGRAM (TEE) ENDOSCOPIC GREATER SAPHENOUS VEIN  HARVEST BILAT LE;  Surgeon: Kerin Perna, MD;  Location: [facility] OR;  Service: Open Heart Surgery;  Laterality: N/A;   CYSTOSCOPY/URETEROSCOPY/HOLMIUM LASER/STENT PLACEMENT Left 08/10/2018   Procedure: CYSTOSCOPY/URETEROSCOPY/HOLMIUM LASER/STENT PLACEMENT;  Surgeon: Jerilee Field, MD;  Location: [facility];  Service: Urology;  Laterality: Left;   CYSTOSCOPY/URETEROSCOPY/HOLMIUM LASER/STENT PLACEMENT Left 09/10/2018   Procedure: CYSTOSCOPY/URETEROSCOPY/HOLMIUM LASER/STENT EXCHANGE;  Surgeon: Jerilee Field, MD;  Location: [facility] ORS;  Service: Urology;  Laterality: Left;   HIP ARTHROPLASTY Right 12/26/2022   Procedure: RIGHT HEMI HIP ARTHROPLASTY;  Surgeon: Joen Laura, MD;  Location: [facility] OR;  Service: Orthopedics;  Laterality: Right;   LEFT HEART CATHETERIZATION WITH CORONARY ANGIOGRAM N/A 04/13/2014   Procedure: LEFT  HEART CATHETERIZATION WITH CORONARY ANGIOGRAM;  Surgeon: Othella Boyer, MD;  Location: [facility] CATH LAB;  Service: Cardiovascular;  Laterality: N/A;   LEG SURGERY Right age 46   closed reduction leg fracture   NASAL SEPTOPLASTY W/ TURBINOPLASTY Bilateral 08/30/2021   Procedure: NASAL SEPTOPLASTY WITH BILATERAL INFERIOR TURBINATE REDUCTION;  Surgeon: Osborn Coho, MD;  Location: [facility] OR;  Service: ENT;  Laterality: Bilateral;   POLYPECTOMY     RIGHT HEART CATH N/A 06/06/2021   Procedure: RIGHT HEART CATH;  Surgeon: Dolores Patty, MD;  Location: [facility] INVASIVE CV LAB;  Service: Cardiovascular;  Laterality: N/A;   TEE WITHOUT CARDIOVERSION N/A 10/02/2015   Procedure: TRANSESOPHAGEAL ECHOCARDIOGRAM (TEE);  Surgeon: Kerin Perna, MD;  [facility] OR;  Service: Open Heart Surgery;  Laterality: N/A;   URETEROSCOPY WITH HOLMIUM LASER LITHOTRIPSY Bilateral 2004;  2005  dr grapey  @[facility]    VASECTOMY     Social History:  reports that he has never smoked. He has never  used smokeless tobacco. He reports that he does not currently use alcohol. He reports that he does not use drugs.  Allergies  Allergen Reactions   Cilostazol Swelling and Other (See Comments)    Edema    Dulaglutide Nausea And Vomiting and Other (See Comments)    TRULICITY   Levofloxacin Hives, Itching and Rash   Liraglutide Other (See Comments)    Severe fatigue & insomnia   Lisinopril Itching, Rash and Cough    Family History  Problem Relation Age of Onset   Heart attack Mother    Heart attack Father    Diabetes Brother    Pancreatic cancer Brother    Diabetes Brother    Colon cancer Neg Hx    Esophageal cancer Neg Hx    Prostate cancer Neg Hx    Rectal cancer Neg Hx    Stomach cancer Neg Hx    Colon polyps Neg Hx     Prior to Admission medications   Medication Sig Start Date End Date Taking? Authorizing Provider  apixaban (ELIQUIS) 5 MG TABS tablet Take 1 tablet (5 mg total) by mouth 2 (two) times daily. 01/01/23   Lorin Glass, MD  atorvastatin (LIPITOR) 80 MG tablet TAKE ONE TABLET BY MOUTH EVERY EVENING Patient taking differently: Take 80 mg by mouth at bedtime. 11/13/22   Weaver, Lorin Picket T, [name]  B-D UF III MINI PEN NEEDLES 31G X 5 MM MISC USE AS DIRECTED WITH LANTUS SOLOSTAR 04/02/19   [provider]  budesonide (PULMICORT) 0.25 MG/2ML nebulizer solution Take 2 mLs (0.25 mg total) by nebulization 2 (two) times daily. 01/01/23   Lorin Glass, MD  buPROPion (WELLBUTRIN XL) 150 MG 24 hr tablet Take 150 mg by mouth every morning. 12/10/22   [provider]  Continuous Blood Gluc Sensor (FREESTYLE LIBRE 2 SENSOR) MISC Inject 1 Device into the skin every 14 (fourteen) days.    [provider]  DULoxetine (CYMBALTA) 30 MG capsule Take 1 capsule (30 mg total) by mouth every morning. 01/13/23   Rhetta Mura, MD  feeding supplement, GLUCERNA SHAKE, (GLUCERNA SHAKE) LIQD Take 237 mLs by mouth 3 (three) times daily between meals. 01/01/23   Lorin Glass, MD  hydrALAZINE (APRESOLINE) 25 MG tablet Take 1 tablet (25 mg total) by mouth every 6 (six) hours as needed (SBP>160 or DBP>110). 01/12/23   Rhetta Mura, MD  insulin glargine-yfgn (SEMGLEE) 100 UNIT/ML injection Inject 0.2 mLs (20 Units total) into the skin daily. 01/13/23   Rhetta Mura,  MD  insulin lispro (HUMALOG) 100 UNIT/ML injection Inject 0-14 Units into the skin in the morning, at noon, in the evening, and at bedtime. 201-250= 2 units 251-300= 4 units, 301-350= 6 units, 351-400= 8 units, 401-450 = 10 units, 451-500= 12 units. If >500 or high, give 14 units.    [provider]  MAGNESIUM PO Take 400 mg by mouth daily.    [provider]  Multiple Vitamins-Minerals (SENIOR MULTIVITAMIN PLUS PO) Take 1 tablet by mouth daily with breakfast.    [provider]  nitroGLYCERIN (NITROSTAT) 0.4 MG SL tablet Place 0.4 mg under the tongue every 5 (five) minutes as needed for chest pain.    [provider]  nystatin (MYCOSTATIN/NYSTOP) powder Apply 1 Application topically 2 (two) times daily as needed (skin irritation). Apply to the groin in the morning and evening    [provider]  OXYGEN Inhale 2 L into the lungs See admin instructions. Inhale 2 L every shift for shortness of breath    [provider]  pantoprazole (PROTONIX) 40 MG tablet TAKE 1 TABLET BY MOUTH EVERY DAY Patient taking differently: Take 40 mg by mouth in the morning. 11/25/19   [name], MD  polyethylene glycol (MIRALAX / GLYCOLAX) 17 g packet Take 17 g by mouth 2 (two) times daily as needed for mild constipation. 01/09/23   Almon Hercules, MD  senna-docusate (SENOKOT-S) 8.6-50 MG tablet Take 1 tablet by mouth 2 (two) times daily as needed for moderate constipation. 01/09/23   Almon Hercules, MD  tamsulosin (FLOMAX) 0.4 MG CAPS capsule Take 1 capsule (0.4 mg total) by mouth daily after breakfast. Patient taking differently: Take 0.4 mg by mouth at bedtime.  01/02/23   Lorin Glass, MD    Physical Exam: Vitals:   02/03/23 0530 02/03/23 0609 02/03/23 0615 02/03/23 0700  BP: (!) 155/78  (!) 142/76 (!) 145/94  Pulse: 80  89 80  Resp: (!) 39  16 (!) 40  Temp:  97.9 F (36.6 C)    TempSrc:  Oral    SpO2: 91%  96% 97%  Weight:      Height:       Constitutional: Elderly male who appears to be in some respiratory distress Eyes: PERRL, lids and conjunctivae normal ENMT: Mucous membranes are moist.   Neck: normal, supple Respiratory: Tachypneic with shallow respirations. Patient on 2 l of nasal cannula oxygen. Cardiovascular: Regular rate and rhythm, no murmurs / rubs / gallops. No extremity edema.   Abdomen: no tenderness, no masses palpated. Bowel sounds positive.  Musculoskeletal: no clubbing / cyanosis.  Skin: no rashes, lesions, ulcers. No induration Neurologic: CN 2-12 grossly intact. Strength 5/5 in all 4.  Psychiatric: Normal judgment and insight. Alert and oriented x 3. Normal mood.   Data Reviewed:  EKG reveals sinus rhythm at 77 bpm with QTc 558.  Reviewed labs, imaging, and pertinent records as noted above in HPI.  Assessment and Plan:   SIRS Upon arrival to the emergency department patient was noted to be tachypneic with WBC elevated 12.5 and initial lactic acid 3.1.  Patient had been bolused IV fluids and started on empiric antibiotics of Rocephin IV.  Lactic acid trended down to 2.5->1.6.  Unclear source at this time.  Question the possibility of UTI. -Admit to a progressive bed -Follow-up urinalysis -Follow-up blood and urine cultures -Continue empiric antibiotics of Rocephin  Transient hypotension Patient reported to be hypotensive prior to arrival to the hospital given 700 mL  fluid bolus in route.  MAP has been maintained here in the hospital.   -Goal MAP greater than 65  Acute metabolic encephalopathy Dementia Wife notes patient has been acutely altered.  At this time patient appears alert and oriented x 3.  CT  scan of the head did not note any acute intercranial abnormality.  Question possibility of infection or dehydration cause of symptoms. -Delirium precautions -Fall precautions  Fall  Weakness Acute on chronic.  Patient noted to have fallen while trying to get in bed and was noted to be significantly weak.  At baseline patient ambulates with use of a rolling walker.  Wife notes frequent falls at baseline.  Reported history of myasthenia gravis.  Weakness could be secondary to infection, dehydration, and/or history of myasthenia gravis. -Up with assistance -PT to evaluate and treat  Acute kidney injury superimposed on chronic kidney disease Creatinine elevated 2.25 with BUN 33.  Creatinine had been around 1.5 on 4/28.  Rule out the possibility of obstruction. -Avoid nephrotoxic agents at this time -Follow-up urinalysis -Check CK given fall -Bladder scan -Continue IV fluids -Recheck kidney function tomorrow morning  Myasthenia gravis Acute respiratory failure with hypoxia Patient noted to have O2 saturations as low as 89% while in the emergency department.  Patient was placed on 2 L of nasal cannula oxygen with improvement O2 saturation greater than 92%.  Reported not being on oxygen at baseline, but wife makes note that he previously had been on oxygen at night and a prescription for CPAP until about 3 years ago when insurance stopped covering it.  Patient is followed by [facility] in [facility] for antibiotic negative myasthenia gravis.   Patient has been evaluated by PCCM and not felt to require monitoring in the ICU. -Continuous pulse oximetry with nasal cannula oxygen to maintain O2 saturation greater than 92%. -Monitor NIF and vital capacity every 12 hours -Records requested from [facility]  Paroxysmal atrial fibrillation on chronic anticoagulation Patient appears to be in a sinus rhythm at this time. CHA2DS2-VASc score = >4. -Continue  Eliquis  Hypokalemia Hypocalcemia Acute.  Repeat labs noted potassium 3.1 with a calcium 8.2. -Give potassium chloride 40 mEq p.o. -Give calcium gluconate IV -Continue to monitor and replace as needed  Prolonged QT interval QTc 558. -Correct electrolyte abnormalities -Avoid QT prolonging medications  Diastolic congestive heart failure Patient appears to be euvolemic on physical exam.  Last echocardiogram noted EF of 60-65% with grade 1 diastolic dysfunction on 12/2022. -Strict I&O's and daily weights  Uncontrolled diabetes mellitus type 2 Last available hemoglobin A1c was 10.7 on 09/18/2022. 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  GERD -Resume PPI when medically appropriate  OSA Patient no longer on CPAP after was not covered by his insurance.  DVT prophylaxis: Eliquis Advance Care Planning:   Code Status: Full Code   Consults:  PCCM  Family Communication: Wife updated over the phone  Severity of Illness: The appropriate patient status for this patient is INPATIENT. Inpatient status is judged to be reasonable and necessary in order to provide the required intensity of service to ensure the patient's safety. The patient's presenting symptoms, physical exam findings, and initial radiographic and laboratory data in the context of their chronic comorbidities is felt to place them at high risk for further clinical deterioration. Furthermore, it is not anticipated that the patient will be medically stable for discharge  from the hospital within 2 midnights of admission.   * I certify that at the point of admission it is my clinical judgment that the patient will require inpatient hospital care spanning beyond 2 midnights from the point of admission due to high intensity of service, high risk for further deterioration and high frequency of surveillance  required.*  Author: Clydie Braun, MD 02/03/2023 7:38 AM  For on call review [email or web address].

## 2023-02-03 NOTE — Consult Note (Signed)
Neurology Consultation    Reason for Consult: Questionable myasthenia gravis flare  CC: Right lower extremity weakness status post fall and dyspnea  HISTORY OF PRESENT ILLNESS   HPI   Fernando Hess is a 77 y.o. male with a past medical history of CAD status post CABG, CHF, CKD stage III, A-fib on anticoagulation, hyperlipidemia, hypertension, MI, mild cognitive impairment, sleep apnea, diabetes and antibody negative myasthenia gravis presents after falling while trying to get into bed today.  Patient states that he had put his knee up on the bed and tried to get into the bed, but experienced weakness of his right leg and fell out of the bed.  He did not strike his head.  Neurology was consulted for possible myasthenia gravis flare in the setting of decreased NIF and tachypnea.  Patient states he was diagnosed with myasthenia gravis at the age of 87 and that his main symptoms are right lower extremity weakness and difficulty breathing, and the symptoms are unchanged since his diagnosis 8 years ago.  He is seen by Dr. Estella Husk at [facility] neurology in [place], and records of his treatment there are not available for personal review today.  Patient states that he has tried multiple modalities of treatment for his myasthenia gravis but is not able to name them.  He states that none of them have helped him significantly.  He also complains of numbness in his right lower extremity from his knee to his foot, which has been present since he had a surgery there years ago. On presentation to the ED, he was noted to have elevated lactate and WBC and is being given appropriate antibiotics for his infection.  Source is unknown, but chest x-ray shows no acute pathology.  History is obtained from: Patient and chart  Addendum: Dr. Brooke Dare of inpatient neuro was previously able to review his outside records and summarized his myasthenia workup in her consult note from 08/13/22 as follows: "myasthenia  gravis workup is included antibody testing for acetylcholine receptor antibody, musk, LRP 4 all negative with a negative CT chest.  Exacerbations have felt to be related to shortness of breath and difficulty breathing and swallowing but the patient was noted to have failed Mestinon, prednisone and IVIG with plan to start Ultomiris (Ravulizumab, and anti-C5 complement system antibody).  Neurological examination 07/30/2022 was notable for normal mental status, patchy weakness, ataxic gait, facial diplegia, peripheral neuropathy, and hypoactive reflexes with 1+ jaw jerk.  Notes confirm the gabapentin was added for neuropathic pain (lumbosacral radiculopathy), and trigger point injections for acute neck pain. Per review of records in August 2023 he was started on IV therapy by [facility] neurology for myasthenia gravis, likely IVIG, unclear when his last dose was.  He does not feel that any of the treatments for myasthenia he has received has made any improvement in his symptoms."   ROS: No fevers, no bowel or bladder changes, no constipation, no sensory changes    ROS: Pertinent items noted in HPI and remainder of comprehensive ROS otherwise negative. Pertinent items are noted in HPI.  PAST MEDICAL HISTORY    Past Medical History:  Past Medical History:  Diagnosis Date   Acute renal failure (ARF) ([name]) 09/24/2015   Acute stress disorder 11/13/2021   Atrial premature complexes    CAD (coronary artery disease) of artery bypass graft    Early occlusion of saphenous vein graft to intermediate and marginal branch in February 2007 following bypass grafting    CAD (coronary artery  disease), native coronary artery 2017   hx NSTEMI 09-24-2015  s/p  CABG x5 on 10-02-2015;  post op STEMI inferolateral wall,  SVG OM1 and SVG OM2 occluded, distal OM occlusion the calpruit, treated medically // Myoview 7/21: no ischemia, EF 65, low risk   CHF (congestive heart failure) ([name])    CKD (chronic kidney disease), stage  III ([name])    Contusion of right knee 03/16/2020   Dyspnea    Elevated troponin    Erectile dysfunction    Esophageal reflux    History of atrial fibrillation    post op CABG 10-02-2015   History of kidney stones    History of non-ST elevation myocardial infarction (NSTEMI) 09/24/2015   s/p  CABG x5   History of ST elevation myocardial infarction (STEMI) 10/22/2015   inferior wall,  post op CABG 10-02-2015   Hyperlipidemia    Hypertension    Left ureteral stone    Mild atherosclerosis of both carotid arteries    Nephrolithiasis    per CT bilateral non-obstructive calculi   OSA (obstructive sleep apnea)    Peripheral artery disease    LE Arterial US 01/2019: R PTA and ATA occluded; L ATA occluded   RBBB (right bundle branch block)    Renal atrophy, right    Sleep apnea    wears cpap    ST elevation myocardial infarction (STEMI) of inferior wall ([name]) 10/22/2015   Type 2 diabetes mellitus treated with insulin ([name])    followed by pcp   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 Family History  Problem Relation Age of Onset   Heart attack Mother    Heart attack Father    Diabetes Brother    Pancreatic cancer Brother    Diabetes Brother    Colon cancer Neg Hx    Esophageal cancer Neg Hx    Prostate cancer Neg Hx    Rectal cancer Neg Hx    Stomach cancer Neg Hx    Colon polyps Neg Hx     Allergies:  Allergies  Allergen Reactions   Cilostazol Swelling and Other (See Comments)    Edema    Dulaglutide Nausea And Vomiting and Other (See Comments)    TRULICITY   Levofloxacin Hives, Itching and Rash   Liraglutide Other (See Comments)    Severe fatigue & insomnia   Lisinopril Itching, Rash and Cough    Social History:   reports that he has never smoked. He has never used smokeless tobacco. He reports that he does not currently use  alcohol. He reports that he does not use drugs.    Medications Medications Prior to Admission  Medication Sig Dispense Refill   apixaban (ELIQUIS) 5 MG TABS tablet Take 1 tablet (5 mg total) by mouth 2 (two) times daily. 60 tablet    atorvastatin (LIPITOR) 80 MG tablet TAKE ONE TABLET BY MOUTH EVERY EVENING (Patient taking differently: Take 80 mg by mouth at bedtime.) 90 tablet 3   budesonide (PULMICORT) 0.25 MG/2ML nebulizer solution Take 2 mLs (0.25 mg total) by nebulization 2 (two) times daily. 60 mL 12   buPROPion (WELLBUTRIN XL) 150 MG 24 hr tablet Take 150 mg by mouth every morning.     DULoxetine (CYMBALTA) 30 MG capsule Take 1 capsule (30 mg total) by mouth every morning. 30 capsule 0   hydrALAZINE (APRESOLINE) 25 MG  tablet Take 1 tablet (25 mg total) by mouth every 6 (six) hours as needed (SBP>160 or DBP>110). 30 tablet 0   insulin glargine-yfgn (SEMGLEE) 100 UNIT/ML injection Inject 0.2 mLs (20 Units total) into the skin daily. 10 mL 11   insulin lispro (HUMALOG) 100 UNIT/ML injection Inject 0-14 Units into the skin in the morning, at noon, in the evening, and at bedtime. 201-250= 2 units 251-300= 4 units, 301-350= 6 units, 351-400= 8 units, 401-450 = 10 units, 451-500= 12 units. If >500 or high, give 14 units.     MAGNESIUM PO Take 400 mg by mouth daily.     Multiple Vitamins-Minerals (SENIOR MULTIVITAMIN PLUS PO) Take 1 tablet by mouth daily with breakfast.     nitroGLYCERIN (NITROSTAT) 0.4 MG SL tablet Place 0.4 mg under the tongue every 5 (five) minutes as needed for chest pain.     nystatin (MYCOSTATIN/NYSTOP) powder Apply 1 Application topically 2 (two) times daily as needed (skin irritation). Apply to the groin in the morning and evening     pantoprazole (PROTONIX) 40 MG tablet TAKE 1 TABLET BY MOUTH EVERY DAY (Patient taking differently: Take 40 mg by mouth in the morning.) 90 tablet 2   polyethylene glycol (MIRALAX / GLYCOLAX) 17 g packet Take 17 g by mouth 2 (two) times daily as  needed for mild constipation. 14 each 0   senna-docusate (SENOKOT-S) 8.6-50 MG tablet Take 1 tablet by mouth 2 (two) times daily as needed for moderate constipation.     tamsulosin (FLOMAX) 0.4 MG CAPS capsule Take 1 capsule (0.4 mg total) by mouth daily after breakfast. (Patient taking differently: Take 0.4 mg by mouth at bedtime.) 30 capsule    B-D UF III MINI PEN NEEDLES 31G X 5 MM MISC USE AS DIRECTED WITH LANTUS SOLOSTAR     Continuous Blood Gluc Sensor (FREESTYLE LIBRE 2 SENSOR) MISC Inject 1 Device into the skin every 14 (fourteen) days.     feeding supplement, GLUCERNA SHAKE, (GLUCERNA SHAKE) LIQD Take 237 mLs by mouth 3 (three) times daily between meals.  0   OXYGEN Inhale 2 L into the lungs See admin instructions. Inhale 2 L every shift for shortness of breath      EXAMINATION    Current vital signs:    02/03/2023   11:45 AM 02/03/2023   11:30 AM 02/03/2023    8:53 AM  Vitals with BMI  Systolic 137 132   Diastolic 80 87   Pulse 83 83 71    Examination:  GENERAL: Awake, alert in NAD HEENT: - Normocephalic and atraumatic, moist mm LUNGS -tachypneic, slightly labored respirations on supplemental O2 CV -regular rate and rhythm on monitor ABDOMEN - Soft, nontender, nondistended  Ext: warm, well perfused, no pedal edema Integumentary:  Skin intact on clothed exam  NEURO:  Mental Status: AA&Ox3, able to give some history of why he came to the hospital today, but not able to give much history of his myasthenia gravis treatment in the past.  He is able to follow simple and two-step commands but was unable to follow commands to count as high as he could on a single breath. Language: speech is clear and fluent with frequent pauses to catch breath Cranial Nerves:  II: PERRL.  III, IV, VI: EOM intact. Eyelids elevate symmetrically.  V: Sensation intact V1-3 symmetrically  VII: no facial asymmetry   VIII: hearing intact to voice IX, X: Phonation is normal.  [id number] shrug 4/5  and symmetrical  XII: tongue is midline  without fasciculations. Motor: 4+/5 throughout including neck flex/ext. Improves with coaching Tone: is normal and bulk is normal DTRs: 2+ and symmetrical in bilateral patellar, unable to elicit biceps and brachioradialis Sensation- Intact to light touch throughout, but diminished in the right lower extremity below the knee Coordination: FTN intact bilaterally, no ataxia in BLE. Gait- deferred    LABS   I have reviewed labs in epic and the results pertinent to this consultation are:   Lab Results  Component Value Date   LDLCALC 66 09/18/2022   Lab Results  Component Value Date   ALT 24 02/02/2023   AST 32 02/02/2023   ALKPHOS 122 02/02/2023   BILITOT 0.7 02/02/2023   Lab Results  Component Value Date   HGBA1C 10.7 (H) 09/18/2022   Lab Results  Component Value Date   WBC 12.5 (H) 02/03/2023   HGB 11.4 (L) 02/03/2023   HCT 35.9 (L) 02/03/2023   MCV 91.1 02/03/2023   PLT 269 02/03/2023   Lab Results  Component Value Date   VITAMINB12 1,084 (H) 01/08/2023   Lab Results  Component Value Date   FOLATE 19.1 01/08/2023   Lab Results  Component Value Date   NA 143 02/03/2023   K 3.1 (L) 02/03/2023   CL 109 02/03/2023   CO2 25 02/03/2023     DIAGNOSTIC IMAGING/PROCEDURES   I have reviewed the images obtained:, as below    CT-head: No acute abnormalities   ASSESSMENT/PLAN    Assessment:  77 y.o. male with a pertinent medical history ofCAD status post CABG, CHF, CKD stage III, A-fib on anticoagulation, hyperlipidemia, hypertension, MI, mild cognitive impairment, sleep apnea, diabetes and antibody negative myasthenia gravis  presenting with right lower extremity weakness and difficulty breathing after a fall out of bed today.  He has noted to have elevated lactate and leukocytosis and is being treated for potential infection.  Exam reveals mild generalized weakness and dyspnea.  NIF is -19.  Patient denies difficulty  speaking or swallowing.  He has no ocular findings on exam. Given that patient states his symptoms have been completely stable since he was diagnosed with myasthenia gravis 8 years ago, it is unlikely that this is an MG flare.  It is somewhat unclear whether patient truly does have myasthenia based on chart review. Because he seems to be at his baseline (unchanged over 8 yrs per patient) he is not felt to have a flare of anything warranting acute treatment.  Myasthenia gravis also generally does not present with unilateral leg weakness and typically has a more fluctuating course.  Patient also has tried multiple treatment modalities, to include Mestinon and likely IVIG for myasthenia gravis without reported improvement.  Recommend further workup of alternative respiratory etiologies for tachypnea and low NIF per primary team. I do think he would benefit from a second opinion regarding possible myasthenia dx  from a neuromuscular subspecialist and we will place an outpatient referral for this to be arranged after discharge.  Impression: Recurrent falls and respiratory distress not favored to be 2/2 myasthenia flare or other neurologic etiology  Recommendations: -Primary team to investigate alternate causes for respiratory distress -Outpatient neurology follow-up with [facility] for second opinion on myasthenia gravis diagnosis -Neurology will sign off, please call with any questions or concerns  Patient seen by NP and then by MD, MD to edit note is needed Cortney E Ernestina Columbia , MSN, AGACNP-BC [facility] See Amion for schedule and pager information 02/03/2023 5:50 PM    **  This documentation was dictated using Dragon Medical Software and may contain inadvertent errors **    Attending Neurohospitalist Addendum Patient seen and examined with APP/Resident. Agree with the history and physical as documented above. Agree with the plan as documented, which I helped  formulate. I have edited the note above to reflect my full findings and recommendations. I have independently reviewed the chart, obtained history, review of systems and examined the patient.I have personally reviewed pertinent head/neck/spine imaging (CT/MRI). Please feel free to call with any questions.  -- Bing Neighbors, MD [facility] [phone]  If 7pm- 7am, please page neurology on call as listed in AMION.

## 2023-02-03 NOTE — Progress Notes (Deleted)
I was called to evaluate patient for tachypnea, low NIF, weakness.   It seems there was some suggestion of a myesthenia gravis diagnosis at some point.  Recent notes from admission from 4/24-4/28 indicate its unclear if actually diagnosed (sees op neuro who manages OSA, I don't see any antibody testing or nm testing on review of results or neurology notes).   ABG shows CO2 level in 30s, good oxygen saturation, cxr appears normal.   Patient is breathing in high 20s, but is able to slow down with good effort when asked.  Does not look consistent with MG picture to me.  I don't suspect that he is as high risk for respiratory decompensation and intubation.   Could be an anxiety or psych component to tachypnea, as he does at least appear to have some component of anxiety.  Patient says he has been chronically sob for several years.  Isolated NIF of -15 is less concerning to me in the setting of some underlying dementia, normal ABG showing no resp insufficiency, and no clear MG diagnosis.   Given the above, I don't think ICU admission is warranted at this time.   If mg workup has not been done I think reasonable to do, consider official neuro consult for recurrent falls, weakness (patient says over years but he seems somewhat questionable historian).   If concern for resp insufficiency, could repeat ABG in several hours, could also do end tidal CO2 to monitor for changes.   Please call us back if needed, will sign off now.

## 2023-02-03 NOTE — ED Notes (Signed)
Checked patient cbg it wads 90 norified RN of blood sugar patient is resting with call bell in reach

## 2023-02-03 NOTE — Sepsis Progress Note (Signed)
Elink following for sepsis protocol. 

## 2023-02-04 DIAGNOSIS — R6510 Systemic inflammatory response syndrome (SIRS) of non-infectious origin without acute organ dysfunction: Secondary | ICD-10-CM | POA: Diagnosis not present

## 2023-02-04 LAB — CBC
HCT: 35.2 % — ABNORMAL LOW (ref 39.0–52.0)
Hemoglobin: 11.5 g/dL — ABNORMAL LOW (ref 13.0–17.0)
MCH: 29.5 pg (ref 26.0–34.0)
MCHC: 32.7 g/dL (ref 30.0–36.0)
MCV: 90.3 fL (ref 80.0–100.0)
Platelets: 260 10*3/uL (ref 150–400)
RBC: 3.9 MIL/uL — ABNORMAL LOW (ref 4.22–5.81)
RDW: 16.3 % — ABNORMAL HIGH (ref 11.5–15.5)
WBC: 9.9 10*3/uL (ref 4.0–10.5)
nRBC: 0 % (ref 0.0–0.2)

## 2023-02-04 LAB — BASIC METABOLIC PANEL
Anion gap: 6 (ref 5–15)
BUN: 28 mg/dL — ABNORMAL HIGH (ref 8–23)
CO2: 25 mmol/L (ref 22–32)
Calcium: 8.7 mg/dL — ABNORMAL LOW (ref 8.9–10.3)
Chloride: 104 mmol/L (ref 98–111)
Creatinine, Ser: 2.09 mg/dL — ABNORMAL HIGH (ref 0.61–1.24)
GFR, Estimated: 32 mL/min — ABNORMAL LOW (ref >60)
Glucose, Bld: 245 mg/dL — ABNORMAL HIGH (ref 70–99)
Potassium: 4.5 mmol/L (ref 3.5–5.1)
Sodium: 135 mmol/L (ref 135–145)

## 2023-02-04 LAB — PROCALCITONIN: Procalcitonin: <0.1 ng/mL

## 2023-02-04 LAB — GLUCOSE, CAPILLARY
Glucose-Capillary: 241 mg/dL — ABNORMAL HIGH (ref 70–99)
Glucose-Capillary: 214 mg/dL — ABNORMAL HIGH (ref 70–99)
Glucose-Capillary: 297 mg/dL — ABNORMAL HIGH (ref 70–99)
Glucose-Capillary: 197 mg/dL — ABNORMAL HIGH (ref 70–99)

## 2023-02-04 LAB — URINE CULTURE: Culture: NO GROWTH

## 2023-02-04 MED ORDER — SODIUM CHLORIDE 0.9 % IV SOLN: INTRAVENOUS | Status: AC

## 2023-02-04 MED ORDER — TAMSULOSIN HCL 0.4 MG PO CAPS
0.4000 mg | ORAL_CAPSULE | Freq: Every day | ORAL | Status: DC
  Administered 2023-02-05 – 2023-02-08 (×4): 0.4 mg via ORAL
  Filled 2023-02-04 (×4): qty 1

## 2023-02-04 MED ORDER — SENNOSIDES-DOCUSATE SODIUM 8.6-50 MG PO TABS: 1.0000 | ORAL_TABLET | Freq: Two times a day (BID) | ORAL | Status: DC | PRN

## 2023-02-04 MED ORDER — INSULIN GLARGINE-YFGN 100 UNIT/ML ~~LOC~~ SOLN
20.0000 [IU] | Freq: Every day | SUBCUTANEOUS | Status: DC
  Administered 2023-02-04: 20 [IU] via SUBCUTANEOUS
  Filled 2023-02-04 (×2): qty 0.2

## 2023-02-04 MED ORDER — BUPROPION HCL ER (XL) 150 MG PO TB24
150.0000 mg | ORAL_TABLET | Freq: Every morning | ORAL | Status: DC
  Administered 2023-02-04 – 2023-02-08 (×5): 150 mg via ORAL
  Filled 2023-02-04 (×5): qty 1

## 2023-02-04 MED ORDER — NITROGLYCERIN 0.4 MG SL SUBL: 0.4000 mg | SUBLINGUAL_TABLET | SUBLINGUAL | Status: DC | PRN

## 2023-02-04 MED ORDER — ATORVASTATIN CALCIUM 80 MG PO TABS
80.0000 mg | ORAL_TABLET | Freq: Every evening | ORAL | Status: DC
  Administered 2023-02-04 – 2023-02-07 (×4): 80 mg via ORAL
  Filled 2023-02-04 (×4): qty 1

## 2023-02-04 MED ORDER — POLYETHYLENE GLYCOL 3350 17 G PO PACK: 17.0000 g | PACK | Freq: Two times a day (BID) | ORAL | Status: DC | PRN

## 2023-02-04 MED ORDER — PANTOPRAZOLE SODIUM 40 MG PO TBEC
40.0000 mg | DELAYED_RELEASE_TABLET | Freq: Every day | ORAL | Status: DC
  Administered 2023-02-04 – 2023-02-08 (×5): 40 mg via ORAL
  Filled 2023-02-04 (×5): qty 1

## 2023-02-04 MED ORDER — HYDRALAZINE HCL 20 MG/ML IJ SOLN
10.0000 mg | INTRAMUSCULAR | Status: DC | PRN
  Administered 2023-02-04 – 2023-02-07 (×3): 10 mg via INTRAVENOUS
  Filled 2023-02-04 (×3): qty 1

## 2023-02-04 MED ORDER — DULOXETINE HCL 30 MG PO CPEP
30.0000 mg | ORAL_CAPSULE | Freq: Every morning | ORAL | Status: DC
  Administered 2023-02-04 – 2023-02-08 (×5): 30 mg via ORAL
  Filled 2023-02-04 (×5): qty 1

## 2023-02-04 NOTE — Progress Notes (Signed)
NIF -15 VC 1.4L

## 2023-02-04 NOTE — Progress Notes (Addendum)
PROGRESS NOTE    Fernando Hess  ZOX:[id number] DOB: 1946/01/04 DOA: 02/02/2023 PCP: [name], MD   Brief Narrative:  HPI: Fernando Hess is a 77 y.o. male with medical history significant of paroxysmal atrial fibrillation, diastolic CHF, CADs/p CABG, diabetes mellitus type 2, CKD stage 3b, anxiety, reported history myasthenia gravis, recent right hip fracture, dementia, and OSA oxygen who presents after falling out of bed.  History is initially obtained from the patient who appears to be alert and oriented x 3.  He states that at baseline he lives with his wife at home and ambulates with use of a rolling walker and is not on oxygen at baseline.  He states that he had been in his normal state of health and was trying to get in bed last night when slipped off the mattress causing him to fall.  Denied any trauma to his head or loss of consciousness.  He denies having any recent fever, chills, nausea, vomiting, diarrhea, or dysuria.  Patient reports that his breathing is often affected from myasthenia gravis which she has had since the age of 69 and was diagnosed by Dr. [name] at [facility] in [place].      Wife notes that that he was not on oxygen at home, but had been placed on oxygen previously while at rest home. Patient previously had been on CPAP at night previously, but his machine broke about 3 years ago  and was not longer covered by insurance. He does not have the antibody in his blood like others with the condition of myasthenia gravis. They had previously tried on infusions twice last year.  She had found him yesterday evening on the side of the bed and thought that he likely slid down onto his bottom.  She reports that he fall also almost daily and she has to call EMS out in order to get him up.  Patient had fallen back in April fractured his right hip requiring right hip hemiarthroplasty. Yesterday patient seemed to be disoriented following the fall when she was asking  him questions which made her more concerned.  She felt like he could possibly be dehydrated.   EMS had reported the patient to be hypotensive and he had been given 700 mL of normal saline IV fluids prior to arrival.   In the emergency department patient was noted to be afebrile with respirations into the 40s, blood pressure as low as 96/57 with improvement up to 155/78 with IV fluids, and O2 saturations maintained on room air.  Initial pH did not show any signs of hypercapnia labs from 5/20 significant for WBC 12.9, BUN 33, creatinine 2.25, calcium 8.6, ionized calcium 1.13, albumin 2.6, and initial lactic acid 3.1.  Chest x-ray showed no active disease. CT scan of the head did not note any acute abnormality.  Blood cultures had been obtained.  Urinalysis was ordered.  PCCM had evaluated the patient due to questionable history of myasthenia gravis with tachypnea, low NIF, and weakness patient had been given 1 L of lactated Ringer's and  Rocephin IV.    Assessment & Plan:   Principal Problem:   SIRS (systemic inflammatory response syndrome) ([name]) Active Problems:   Transient hypotension   Acute metabolic encephalopathy   Dementia without behavioral disturbance ([name])   Generalized weakness   Fall at home, initial encounter   Acute kidney injury superimposed on chronic kidney disease ([name])   Acute respiratory failure with hypoxia ([name])   Diabetes mellitus, type 2 (  [name])   Myasthenia gravis ([name])   Hypokalemia   Prolonged QT interval  SIRS/dehydration Upon arrival to the emergency department patient was noted to be tachypneic with WBC elevated 12.5 and initial lactic acid 3.1.  Patient had been bolused IV fluids and started on empiric antibiotics of Rocephin IV.  Lactic acid trended down to 2.5->1.6.  Based on his clinical picture, I do not think we are dealing with any infection here.  You unremarkable, chest x-ray unremarkable, procalcitonin unremarkable.  I think his presentation was more  likely secondary to dehydration, will discontinue antibiotics.  Resume IV fluids.   Transient hypotension Patient reported to be hypotensive prior to arrival to the hospital given 700 mL fluid bolus in route.  Blood pressure improving.  Resuming IV fluids.   Acute metabolic encephalopathy Dementia Wife notes patient has been acutely altered.  Upon arrival to ED, he was fully alert and oriented.  CT scan of the head did not note any acute intercranial abnormality.  He is fully alert and oriented now as well.   Fall /generalized weakness/left leg weakness Acute on chronic.  Patient noted to have fallen while trying to get in bed and was noted to be significantly weak.  At baseline patient ambulates with use of a rolling walker.  Wife notes frequent falls at baseline.  Reported history of myasthenia gravis.  Admitting hospitalist had suspected possible myasthenia gravis exacerbation.  Patient was seen by neurology, he was not thought to have myasthenia gravis exacerbation.  Neurology signed off.  On my examination, patient appears to have equal strength in all extremities.  I think his presentation is more likely due to deconditioning and dehydration.  He has been seen by PT OT and they recommended SNF.  I discussed with the patient, he is agreeable to going to SNF except 2 facilities.  I have consulted TOC.   CKD stage IV/AKI ruled out Upon extensive review, it appears that patient's creatinine is fluctuating and is ranging between 1.5-2.2.  Currently 2.1.  Appears to be at his baseline.  But clinically he still appears dehydrated so resumed IV fluids.   Acute respiratory failure with hypoxia: Chest x-ray unremarkable.  Myasthenia gravis was thought to be suspected.  Patient complained of shortness of breath.  Chest x-ray and procalcitonin unremarkable.  I wonder if this is secondary to atelectasis.  Incentive spirometry is ordered but was not given to the patient.  Primary RN was advised to do that.   Patient is now saturating 98% on room air.   Paroxysmal atrial fibrillation on chronic anticoagulation Asymptomatic and in sinus rhythm.  CHA2DS2-VASc score = >4. -Continue Eliquis   Hypokalemia: Resolved.  Chronic hypocalcemia Received some gluconate.  No indication for replacement at this point in time.   Prolonged QT interval QTc 558. -Correct electrolyte abnormalities -Avoid QT prolonging medications   Diastolic congestive heart failure = Last echocardiogram noted EF of 60-65% with grade 1 diastolic dysfunction on 12/2022.  Appears slightly dehydrated.  Gentle IV hydration, strict I's and O's.   Uncontrolled diabetes mellitus type 2 Last available hemoglobin A1c was 10.7 on 09/18/2022.  Was started on SSI, hypoglycemic.  Takes 20 units of Tresiba at home.  Will resume that here and continue SSI.   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  Currently asymptomatic.  Follow-up outpatient with PCP.   GERD Continue PPI.   OSA Patient no longer on CPAP after was not covered by his insurance.  DVT  prophylaxis: Eliquis   Code Status: Full Code  Family Communication:  None present at bedside.  Plan of care discussed with patient in length and he/she verbalized understanding and agreed with it.  Updated his wife over the phone.  Status is: Inpatient Remains inpatient appropriate because: Patient is now medically stable but needs SNF.  Pending placement.   Estimated body mass index is 29.04 kg/m as calculated from the following:   Height as of this encounter: 5\' 6"  (1.676 m).   Weight as of this encounter: 81.6 kg.    Nutritional Assessment: Body mass index is 29.04 kg/m.[name] Seen by dietician.  I agree with the assessment and plan as outlined below: Nutrition Status:        . Skin Assessment: I have examined the patient's skin and I agree with the wound assessment as performed by the wound care RN as outlined below:    Consultants:   Neurology-signed off  Procedures:  None  Antimicrobials:  Anti-infectives (From admission, onward)    Start     Dose/Rate Route Frequency Ordered Stop   02/04/23 0400  cefTRIAXone (ROCEPHIN) 1 g in sodium chloride 0.9 % 100 mL IVPB        1 g 200 mL/hr over 30 Minutes Intravenous Every 24 hours 02/03/23 0756     02/03/23 0100  cefTRIAXone (ROCEPHIN) 1 g in sodium chloride 0.9 % 100 mL IVPB        1 g 200 mL/hr over 30 Minutes Intravenous  Once 02/03/23 0053 02/03/23 0227         Subjective: Patient seen and examined.  He complains of generalized weakness as well as some shortness of breath.  Denies any cough, fever or any close contact to someone with pneumonia or similar symptoms.  Objective: Vitals:   02/04/23 0344 02/04/23 0544 02/04/23 0650 02/04/23 0724  BP: (!) 198/86 (!) 166/97  (!) 166/86  Pulse: 82 91 (!) 46 97  Resp: 17 20 (!) 21 19  Temp: 98.5 F (36.9 C)   98 F (36.7 C)  TempSrc: Oral   Oral  SpO2: 97% 99%  96%  Weight:   81.6 kg   Height:        Intake/Output Summary (Last 24 hours) at 02/04/2023 1041 Last data filed at 02/04/2023 1308 Gross per 24 hour  Intake 577 ml  Output 1100 ml  Net -523 ml   Filed Weights   02/02/23 2153 02/04/23 0650  Weight: 85 kg 81.6 kg    Examination:  General exam: Appears calm and comfortable  Respiratory system: Rhonchi at right lower lobe. Respiratory effort normal. Cardiovascular system: S1 & S2 heard, RRR. No JVD, murmurs, rubs, gallops or clicks. No pedal edema. Gastrointestinal system: Abdomen is nondistended, soft and nontender. No organomegaly or masses felt. Normal bowel sounds heard. Central nervous system: Alert and oriented. No focal neurological deficits. Extremities: Symmetric 5 x 5 power. Skin: No rashes, lesions or ulcers Psychiatry: Judgement and insight appear normal. Mood & affect appropriate.    Data Reviewed: I have personally reviewed following labs and imaging studies  CBC: Recent Labs   Lab 02/02/23 2245 02/02/23 2316 02/03/23 0613 02/04/23 0202  WBC 12.9*  --  12.5* 9.9  NEUTROABS 9.5*  --   --   --   HGB 11.8* 12.2* 11.4* 11.5*  HCT 36.5* 36.0* 35.9* 35.2*  MCV 90.8  --  91.1 90.3  PLT 310  --  269 260   Basic Metabolic Panel: Recent Labs  Lab  02/02/23 2245 02/02/23 2316 02/03/23 0613 02/04/23 0202  NA 143 144 143 135  K 3.7 3.6 3.1* 4.5  CL 107  --  109 104  CO2 25  --  25 25  GLUCOSE 136*  --  96 245*  BUN 33*  --  31* 28*  CREATININE 2.25*  --  2.00* 2.09*  CALCIUM 8.6*  --  8.2* 8.7*   GFR: Estimated Creatinine Clearance: 29.2 mL/min (A) (by C-G formula based on SCr of 2.09 mg/dL (H)). Liver Function Tests: Recent Labs  Lab 02/02/23 2245  AST 32  ALT 24  ALKPHOS 122  BILITOT 0.7  PROT 5.3*  ALBUMIN 2.6*   No results for input(s): "LIPASE", "AMYLASE" in the last 168 hours. No results for input(s): "AMMONIA" in the last 168 hours. Coagulation Profile: No results for input(s): "INR", "PROTIME" in the last 168 hours. Cardiac Enzymes: Recent Labs  Lab 02/03/23 0618  CKTOTAL 52   BNP (last 3 results) No results for input(s): "PROBNP" in the last 8760 hours. HbA1C: No results for input(s): "HGBA1C" in the last 72 hours. CBG: Recent Labs  Lab 02/03/23 1213 02/03/23 1241 02/03/23 1536 02/03/23 2115 02/04/23 0618  GLUCAP 158* 148* 198* 250* 241*   Lipid Profile: No results for input(s): "CHOL", "HDL", "LDLCALC", "TRIG", "CHOLHDL", "LDLDIRECT" in the last 72 hours. Thyroid Function Tests: No results for input(s): "TSH", "T4TOTAL", "FREET4", "T3FREE", "THYROIDAB" in the last 72 hours. Anemia Panel: No results for input(s): "VITAMINB12", "FOLATE", "FERRITIN", "TIBC", "IRON", "RETICCTPCT" in the last 72 hours. Sepsis Labs: Recent Labs  Lab 02/02/23 2352 02/03/23 0156 02/03/23 1610  LATICACIDVEN 3.1* 2.5* 1.6    Recent Results (from the past 240 hour(s))  Culture, blood (routine x 2)     Status: None (Preliminary result)    Collection Time: 02/03/23  1:57 AM   Specimen: BLOOD RIGHT HAND  Result Value Ref Range Status   Specimen Description BLOOD RIGHT HAND  Final   Special Requests   Final    BOTTLES DRAWN AEROBIC AND ANAEROBIC Blood Culture results may not be optimal due to an excessive volume of blood received in culture bottles   Culture   Final    NO GROWTH 1 DAY Performed at [facility], [address], [place], [name] [address]    Report Status PENDING  Incomplete  Culture, blood (routine x 2)     Status: None (Preliminary result)   Collection Time: 02/03/23  1:57 AM   Specimen: BLOOD LEFT HAND  Result Value Ref Range Status   Specimen Description BLOOD LEFT HAND  Final   Special Requests   Final    BOTTLES DRAWN AEROBIC AND ANAEROBIC Blood Culture adequate volume   Culture   Final    NO GROWTH 1 DAY Performed at [facility], [address], [place], [name] [address]    Report Status PENDING  Incomplete     Radiology Studies: CT Head Wo Contrast  Result Date: 02/02/2023 CLINICAL DATA:  Head trauma, minor (Age >= 65y) EXAM: CT HEAD WITHOUT CONTRAST TECHNIQUE: Contiguous axial images were obtained from the base of the skull through the vertex without intravenous contrast. RADIATION DOSE REDUCTION: This exam was performed according to the departmental dose-optimization program which includes automated exposure control, adjustment of the mA and/or kV according to patient size and/or use of iterative reconstruction technique. COMPARISON:  01/07/2023 FINDINGS: Brain: Normal anatomic configuration. Parenchymal volume loss is commensurate with the patient's age. Mild periventricular white matter changes are  present likely reflecting the sequela of small vessel ischemia. 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 No abnormal intra or extra-axial mass lesion or fluid collection. No abnormal mass effect or midline shift. No evidence of acute intracranial hemorrhage or infarct.  Ventricular size is normal. Cerebellum unremarkable. Vascular: No asymmetric hyperdense vasculature at the skull base. Skull: Intact Sinuses/Orbits: Paranasal sinuses are clear. Orbits are unremarkable. Other: Mastoid air cells and middle ear cavities are clear. IMPRESSION: 1. No acute intracranial abnormality. No calvarial fracture. 2. Mild senescent change. 3. 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 Electronically Signed   By: Helyn Numbers M.D.   On: 02/02/2023 23:08   DG Chest Port 1 View  Result Date: 02/02/2023 CLINICAL DATA:  Dyspnea EXAM: PORTABLE CHEST 1 VIEW COMPARISON:  01/08/2023 FINDINGS: Lungs are clear. No pneumothorax or pleural effusion. Coronary artery bypass grafting has been performed. Cardiac size within normal limits. Pulmonary vascularity is normal. No acute bone abnormality. IMPRESSION: No active disease. Electronically Signed   By: Helyn Numbers M.D.   On: 02/02/2023 23:05    Scheduled Meds:  apixaban  5 mg Oral BID   atorvastatin  80 mg Oral QPM   buPROPion  150 mg Oral q morning   DULoxetine  30 mg Oral q morning   insulin aspart  0-15 Units Subcutaneous TID WC   insulin aspart  0-5 Units Subcutaneous QHS   insulin glargine-yfgn  20 Units Subcutaneous Daily   pantoprazole  40 mg Oral Daily   sodium chloride flush  3 mL Intravenous Q12H   [START ON 02/05/2023] tamsulosin  0.4 mg Oral QPC breakfast   Continuous Infusions:  sodium chloride     cefTRIAXone (ROCEPHIN)  IV 1 g (02/04/23 0418)     LOS: 1 day   Hughie Closs, MD [facility]  02/04/2023, 10:41 AM   *Please note that this is a verbal dictation therefore any spelling or grammatical errors are due to the "Dragon [employer] One" system interpretation.  Please page via Amion and do not message via secure chat for urgent patient care matters. Secure chat can be used for non urgent patient care matters.  How to contact the [facility] Attending or Consulting provider 7A - 7P or covering  provider during after hours 7P -7A, for this patient?  Check the care team in [facility] and look for a) attending/consulting [facility] provider listed and b) the [facility] team listed. Page or secure chat 7A-7P. Log into [email or web address] and use [place]'s universal password to access. If you do not have the password, please contact the hospital operator. Locate the [facility] provider you are looking for under [facility] and page to a number that you can be directly reached. If you still have difficulty reaching the provider, please page the [facility] (Director on Call) for the Hospitalists listed on amion for assistance.

## 2023-02-04 NOTE — Progress Notes (Signed)
TR[facility] night cross cover note:   I was notified by RN of patient's most recent blood pressure of 198/86, with current heart rates in the 80s.  No report of any new symptoms, including no report of any acute focal neuro deficits.  I subsequently placed order for as needed IV hydralazine for systolic blood pressure greater than 180 or diastolic blood pressure greater than 120.    Newton Pigg, DO Hospitalist

## 2023-02-04 NOTE — Social Work (Signed)
CSW spoke with patient's spouse- she is agreeable to SNF and wants [facility] for spouse.  CSW informed will call back tomorrow to avoid patient being distracted while driving. CSW will follow up with patient and spouse tomorrow.   Antony Blackbird, MSW, LCSW Clinical Social Worker

## 2023-02-04 NOTE — Progress Notes (Signed)
NIF -20 VC 1.6L  Pt gave good effort.

## 2023-02-04 NOTE — Progress Notes (Signed)
OT Impression: Pt evaluated s/p the admission list. Pt reports assistance from aide for ADLs and use of a rollator for mobility at baseline however, pt is a questionable historian. During session, pt limited secondary to decreased activity tolerance, anxiety, generalized weakness, decreased balance, full body tremors with functional movements, SOB, and cognition. Pt currently requires supervision for seated UB ADLs and max A +2 for LB ADLs. During session pt required mod A +2 for functional transfers and mobility with use of RW. Attempted PLB exercises however, pt unable to perform. Pt would benefit from continued acute OT services to maximize safety and functional independence and facilitate transition to skilled inpatient follow up therapy, <3 hours/day.   02/04/23 1045  OT Visit Information  Last OT Received On 02/04/23  Assistance Needed +2  History of Present Illness 77 y.o. male presents to [facility] hospital on 02/02/2023 after falling out of bed. Found to be hypotensive with AKI, Acute respiratory failure with hypoxia and afib. 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  Precautions  Precautions Fall  Precaution Comments fall  Restrictions  Weight Bearing Restrictions Yes  RLE Weight Bearing WBAT  Home Living  Family/patient expects to be discharged to: Private residence  Living Arrangements Spouse/significant other  Available Help at Discharge Family;Available 24 hours/day (wife unable to provide physical assist)  Type of Home House  Home Access Ramped entrance  Home Layout Two level;Able to live on main level with bedroom/bathroom (does report pt's spouse may be buying a stair lift)  Bathroom Shower/Tub Tub/shower unit  Firefighter Handicapped height  Bathroom Accessibility Yes  How Accessible Accessible via wheelchair  Home Equipment Rollator (4 wheels);Shower seat;Grab bars -  tub/shower;BSC/3in1;Wheelchair - manual  Additional Comments Has aide that comes 5 days/week for 3 hours to assist pt and his wife with ADL/IADLs  Prior Function  Prior Level of Function  Needs assist;Patient poor historian/Family not available  Mobility Comments ambulatory with rollator for short household distances. history of frequent falls  ADLs Comments Pt reported independence with ADLs at start of session, then reports use of aide for bathing/dressing. Pt questionable historian.  Communication  Communication No difficulties  Pain Assessment  Pain Assessment No/denies pain  Pain Intervention(s) Monitored during session  Cognition  Arousal/Alertness Awake/alert  Behavior During Therapy Anxious  Overall Cognitive Status No family/caregiver present to determine baseline cognitive functioning  Area of Impairment Attention;Memory;Following commands;Safety/judgement;Problem solving;Awareness  Orientation Level  (unable to assess due to anxiety)  Current Attention Level Sustained  Memory Decreased recall of precautions;Decreased short-term memory  Following Commands Follows one step commands with increased time;Follows one step commands inconsistently  Safety/Judgement Decreased awareness of safety;Decreased awareness of deficits  Awareness Emergent  Problem Solving Slow processing;Difficulty sequencing;Requires verbal cues  General Comments Pt anxious over his breathing this session with VSS. Pt required max verbal cues for safety, redirection and sequencing. Pt following simple 1 step commands inconsistently with increased time. Pt self-distracting required max cues to redirect.  Upper Extremity Assessment  Upper Extremity Assessment Generalized weakness  Lower Extremity Assessment  Lower Extremity Assessment Defer to PT evaluation  Cervical / Trunk Assessment  Cervical / Trunk Assessment Kyphotic  Vision- History  Baseline Vision/History 0 No visual deficits  Ability to See in  Adequate Light 0 Adequate  Vision- Assessment  Vision Assessment? No apparent visual deficits  Perception  Perception Tested? No  Praxis  Praxis tested? Not tested  ADL  Overall ADL's  Needs assistance/impaired  Eating/Feeding Set up;Sitting  Grooming Set up;Sitting  Upper Body Bathing Supervision/ safety;Sitting  Lower Body Bathing Maximal assistance;+2 for physical assistance;+2 for safety/equipment;Sit to/from stand  Upper Body Dressing  Set up;Sitting  Lower Body Dressing Maximal assistance;+2 for physical assistance;+2 for safety/equipment;Sit to/from Scientist, research (life sciences) Moderate assistance;+2 for physical assistance;+2 for safety/equipment;Rolling walker (2 wheels);BSC/3in1;Stand-pivot  Toileting- Clothing Manipulation and Hygiene Maximal assistance;+2 for physical assistance;+2 for safety/equipment;Sit to/from stand  Functional mobility during ADLs Moderate assistance;+2 for physical assistance;+2 for safety/equipment;Rolling walker (2 wheels)  General ADL Comments Assistance levels given as pt requires mod A +2 in standing with BUE support on RW for stability.  Bed Mobility  Overal bed mobility Needs Assistance  Bed Mobility Supine to Sit  Supine to sit Min guard;HOB elevated  General bed mobility comments HOB elevated  Transfers  Overall transfer level Needs assistance  Equipment used Rolling walker (2 wheels)  Transfers Sit to/from Stand;Bed to chair/wheelchair/BSC  Sit to Stand Mod assist;+2 physical assistance;+2 safety/equipment  Bed to/from chair/wheelchair/BSC transfer type: Step pivot  Step pivot transfers Mod assist;+2 physical assistance;+2 safety/equipment  General transfer comment Pt unable to achieve stand from EOB with max A +1 and HHA. Pt required mod A +2 and RW to achieve full stand with BUE tremors noted in standing. Pt completed step pivot transfer to chair with mod A +2 and RW.  Balance  Overall balance assessment Needs assistance  Sitting-balance  support No upper extremity supported;Feet supported  Sitting balance-Leahy Scale Fair  Sitting balance - Comments sitting EOB  Standing balance support Bilateral upper extremity supported;Reliant on assistive device for balance  Standing balance-Leahy Scale Poor  Standing balance comment requires BUE support on RW and mod A +2 for balance  General Comments  General comments (skin integrity, edema, etc.) SpO2 >94% throughout session on 2L [place]. During OOB activity HR 110, RR 29, tele alarming afib, recovered in sitting.  OT - End of Session  Equipment Utilized During Treatment Oxygen;Gait belt;Rolling walker (2 wheels)  Activity Tolerance Other (comment) (anxiety)  Patient left in chair;with call bell/phone within reach;with chair alarm set  Nurse Communication Mobility status  OT Assessment  OT Recommendation/Assessment Patient needs continued OT Services  OT Visit Diagnosis Other abnormalities of gait and mobility (R26.89);Muscle weakness (generalized) (M62.81);History of falling (Z91.81);Other symptoms and signs involving cognitive function  OT Problem List Decreased strength;Decreased activity tolerance;Impaired balance (sitting and/or standing);Decreased cognition;Decreased safety awareness;Decreased knowledge of use of DME or AE;Decreased knowledge of precautions;Decreased coordination;Cardiopulmonary status limiting activity  OT Plan  OT Frequency (ACUTE ONLY) Min 2X/week  OT Treatment/Interventions (ACUTE ONLY) Self-care/ADL training;DME and/or AE instruction;Energy conservation;Therapeutic activities;Cognitive remediation/compensation;Patient/family education;Balance training  AM-PAC OT "6 Clicks" Daily Activity Outcome Measure (Version 2)  Help from another person eating meals? 3  Help from another person taking care of personal grooming? 3  Help from another person toileting, which includes using toliet, bedpan, or urinal? 2  Help from another person bathing (including washing,  rinsing, drying)? 2  Help from another person to put on and taking off regular upper body clothing? 3  Help from another person to put on and taking off regular lower body clothing? 2  6 Click Score 15  Progressive Mobility  What is the highest level of mobility based on the progressive mobility assessment? Level 5 (Walks with assist in room/hall) - Balance while stepping forward/back and can walk in room with assist - Complete  Activity Transferred from bed to chair  OT  Recommendation  Follow Up Recommendations Skilled nursing-short term rehab (<3 hours/day)  Assistance recommended at discharge Frequent or constant Supervision/Assistance  Patient can return home with the following Two people to help with walking and/or transfers;Two people to help with bathing/dressing/bathroom;Assistance with cooking/housework;Direct supervision/assist for medications management;Direct supervision/assist for financial management;Assist for transportation;Help with stairs or ramp for entrance  Functional Status Assessent Patient has had a recent decline in their functional status and demonstrates the ability to make significant improvements in function in a reasonable and predictable amount of time.  OT Equipment Other (comment) (defer)  Individuals Consulted  Consulted and Agree with Results and Recommendations Patient  Acute Rehab OT Goals  Patient Stated Goal to rest  OT Goal Formulation With patient  Time For Goal Achievement 02/18/23  Potential to Achieve Goals Good  OT Time Calculation  OT Start Time (ACUTE ONLY) 1000  OT Stop Time (ACUTE ONLY) 1019  OT Time Calculation (min) 19 min  OT General Charges  $OT Visit 1 Visit  OT Evaluation  $OT Eval Moderate Complexity 1 Mod  Written Expression  Dominant Hand Right  Sherley Bounds, OTS Acute Rehabilitation Services Office [phone] Secure Chat Communication Preferred

## 2023-02-05 DIAGNOSIS — R6510 Systemic inflammatory response syndrome (SIRS) of non-infectious origin without acute organ dysfunction: Secondary | ICD-10-CM | POA: Diagnosis not present

## 2023-02-05 LAB — CBC WITH DIFFERENTIAL/PLATELET
Abs Immature Granulocytes: 0.11 10*3/uL — ABNORMAL HIGH (ref 0.00–0.07)
Basophils Absolute: 0.1 10*3/uL (ref 0.0–0.1)
Basophils Relative: 1 %
Eosinophils Absolute: 0.3 10*3/uL (ref 0.0–0.5)
Eosinophils Relative: 3 %
HCT: 36.2 % — ABNORMAL LOW (ref 39.0–52.0)
Hemoglobin: 11.5 g/dL — ABNORMAL LOW (ref 13.0–17.0)
Immature Granulocytes: 1 %
Lymphocytes Relative: 16 %
Lymphs Abs: 1.6 10*3/uL (ref 0.7–4.0)
MCH: 28.8 pg (ref 26.0–34.0)
MCHC: 31.8 g/dL (ref 30.0–36.0)
MCV: 90.7 fL (ref 80.0–100.0)
Monocytes Absolute: 1 10*3/uL (ref 0.1–1.0)
Monocytes Relative: 9 %
Neutro Abs: 7.2 10*3/uL (ref 1.7–7.7)
Neutrophils Relative %: 70 %
Platelets: 256 10*3/uL (ref 150–400)
RBC: 3.99 MIL/uL — ABNORMAL LOW (ref 4.22–5.81)
RDW: 16.3 % — ABNORMAL HIGH (ref 11.5–15.5)
WBC: 10.3 10*3/uL (ref 4.0–10.5)
nRBC: 0 % (ref 0.0–0.2)

## 2023-02-05 LAB — BASIC METABOLIC PANEL
Anion gap: 8 (ref 5–15)
BUN: 24 mg/dL — ABNORMAL HIGH (ref 8–23)
CO2: 20 mmol/L — ABNORMAL LOW (ref 22–32)
Calcium: 8.4 mg/dL — ABNORMAL LOW (ref 8.9–10.3)
Chloride: 107 mmol/L (ref 98–111)
Creatinine, Ser: 1.66 mg/dL — ABNORMAL HIGH (ref 0.61–1.24)
GFR, Estimated: 42 mL/min — ABNORMAL LOW (ref >60)
Glucose, Bld: 320 mg/dL — ABNORMAL HIGH (ref 70–99)
Potassium: 3.6 mmol/L (ref 3.5–5.1)
Sodium: 135 mmol/L (ref 135–145)

## 2023-02-05 LAB — GLUCOSE, CAPILLARY
Glucose-Capillary: 222 mg/dL — ABNORMAL HIGH (ref 70–99)
Glucose-Capillary: 242 mg/dL — ABNORMAL HIGH (ref 70–99)
Glucose-Capillary: 212 mg/dL — ABNORMAL HIGH (ref 70–99)
Glucose-Capillary: 225 mg/dL — ABNORMAL HIGH (ref 70–99)

## 2023-02-05 MED ORDER — INSULIN GLARGINE-YFGN 100 UNIT/ML ~~LOC~~ SOLN
30.0000 [IU] | Freq: Every day | SUBCUTANEOUS | Status: DC
  Administered 2023-02-05 – 2023-02-08 (×4): 30 [IU] via SUBCUTANEOUS
  Filled 2023-02-05 (×4): qty 0.3

## 2023-02-05 MED ORDER — HYDRALAZINE HCL 25 MG PO TABS
25.0000 mg | ORAL_TABLET | Freq: Four times a day (QID) | ORAL | Status: DC | PRN
  Administered 2023-02-05: 25 mg via ORAL
  Filled 2023-02-05: qty 1

## 2023-02-05 NOTE — NC FL2 (Signed)
[place] MEDICAID FL2 LEVEL OF CARE FORM     IDENTIFICATION  Patient Name: Fernando Hess Birthdate: [birth year] Sex: male Admission Date (Current Location): 02/02/2023  [facility] and [place] Number:  Producer, television/film/video and Address:  [facility] [place], [address] [place], [name] [address]      Provider Number: [id number]  Attending Physician Name and Address:  Hughie Closs, MD  Relative Name and Phone Number:       Current Level of Care: Hospital Recommended Level of Care: Skilled Nursing Facility Prior Approval Number:    Date Approved/Denied:   PASRR Number: pending  Discharge Plan: SNF    Current Diagnoses: Patient Active Problem List   Diagnosis Date Noted   Acute kidney injury superimposed on chronic kidney disease ([name]) 02/03/2023   Transient hypotension 02/03/2023   SIRS (systemic inflammatory response syndrome) ([name]) 02/03/2023   Dementia without behavioral disturbance ([name]) 02/03/2023   Fall at home, initial encounter 02/03/2023   Acute respiratory failure with hypoxia ([name]) 02/03/2023   Hypokalemia 02/03/2023   Prolonged QT interval 02/03/2023   Left ankle pain 01/08/2023   Hematuria 01/08/2023   Fracture of femoral neck, right, closed ([name]) 12/24/2022   Depression with anxiety 12/24/2022   Acute metabolic encephalopathy 09/18/2022   Acute encephalopathy 09/17/2022   AKI (acute kidney injury) ([name]) 09/04/2022   Generalized weakness 08/12/2022   Myasthenia gravis ([name]) 04/21/2022   Chronic kidney disease, stage 3b ([name]) 03/21/2022   Posterior vitreous detachment of both eyes 01/23/2022   Constipation 11/13/2021   Diabetes mellitus, type 2 ([name]) 11/13/2021   Severe major depression, single episode, without psychotic features ([name]) 11/13/2021   Amaurosis fugax 11/13/2021   Amnesia 11/13/2021   Anxiety 11/13/2021   Cardiac arrhythmia 11/13/2021   Hearing loss 11/13/2021   Hypercoagulable state ([name]) 11/13/2021    Hyperparathyroidism due to renal insufficiency ([name]) 11/13/2021   Mild aortic stenosis 11/05/2021   Paroxysmal atrial fibrillation ([name]) 11/05/2021   Nasal septal deviation 08/30/2021   Anticoagulated by anticoagulation treatment 07/16/2021   Syncope 05/10/2021   Gastro-esophageal reflux disease without esophagitis 07/25/2020   Globus pharyngeus 07/25/2020   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) 12/21/2019   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   Chronic heart failure with preserved ejection fraction (HFpEF) ([name]) 11/18/2019   OSA on CPAP 03/07/2019   Peripheral artery disease    Intractable vascular headache 11/11/2018   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   Coronary artery disease involving native coronary artery of native heart without angina pectoris 09/24/2015   Moderate nonproliferative diabetic retinopathy of left eye with macular edema associated with type 2 diabetes mellitus ([name]) 03/01/2008   Hyperlipidemia LDL goal <70    Essential hypertension    History of kidney stones     Orientation RESPIRATION BLADDER Height & Weight     Self, Situation, Place  Normal Continent, External catheter Weight: 185 lb 3 oz (84 kg) Height:  5\' 6"  (167.6 cm)  BEHAVIORAL SYMPTOMS/MOOD NEUROLOGICAL BOWEL NUTRITION STATUS      Continent Diet (see d/c summary)  AMBULATORY STATUS COMMUNICATION OF NEEDS Skin   Extensive Assist Verbally Normal                       Personal Care Assistance Level of Assistance  Bathing, Feeding, Dressing Bathing Assistance: Limited assistance Feeding assistance: Independent Dressing Assistance: Limited assistance     Functional Limitations Info  Sight, Hearing, Speech  Sight Info: Impaired Hearing Info: Adequate Speech Info: Adequate    SPECIAL CARE FACTORS FREQUENCY  PT (By licensed PT), OT (By licensed OT)     PT Frequency: 5x/week OT Frequency: 5x/week            Contractures Contractures Info: Not  present    Additional Factors Info  Code Status, Allergies Code Status Info: full code Allergies Info: Cilostazol, Dulaglutide, Levofloxacin, Liraglutide, lisinopril           Current Medications (02/05/2023):  This is the current hospital active medication list Current Facility-Administered Medications  Medication Dose Route Frequency Provider Last Rate Last Admin   acetaminophen (TYLENOL) tablet 650 mg  650 mg Oral Q6H PRN Madelyn Flavors A, MD   650 mg at 02/04/23 2150   Or   acetaminophen (TYLENOL) suppository 650 mg  650 mg Rectal Q6H PRN Madelyn Flavors A, MD       albuterol (PROVENTIL) (2.5 MG/3ML) 0.083% nebulizer solution 2.5 mg  2.5 mg Nebulization Q4H PRN Clydie Braun, MD       apixaban (ELIQUIS) tablet 5 mg  5 mg Oral BID Madelyn Flavors A, MD   5 mg at 02/05/23 0813   atorvastatin (LIPITOR) tablet 80 mg  80 mg Oral QPM Pahwani, Daleen Bo, MD   80 mg at 02/04/23 1726   buPROPion (WELLBUTRIN XL) 24 hr tablet 150 mg  150 mg Oral q morning Hughie Closs, MD   150 mg at 02/05/23 0813   cefTRIAXone (ROCEPHIN) 1 g in sodium chloride 0.9 % 100 mL IVPB  1 g Intravenous Q24H Smith, Rondell A, MD 200 mL/hr at 02/05/23 0421 1 g at 02/05/23 0421   DULoxetine (CYMBALTA) DR capsule 30 mg  30 mg Oral q morning Hughie Closs, MD   30 mg at 02/05/23 0813   hydrALAZINE (APRESOLINE) injection 10 mg  10 mg Intravenous Q4H PRN Howerter, Justin B, DO   10 mg at 02/04/23 2159   hydrALAZINE (APRESOLINE) tablet 25 mg  25 mg Oral Q6H PRN Hughie Closs, MD   25 mg at 02/05/23 0813   insulin aspart (novoLOG) injection 0-15 Units  0-15 Units Subcutaneous TID WC Smith, Rondell A, MD   5 Units at 02/05/23 1220   insulin aspart (novoLOG) injection 0-5 Units  0-5 Units Subcutaneous QHS Madelyn Flavors A, MD   2 Units at 02/03/23 2145   insulin glargine-yfgn (SEMGLEE) injection 30 Units  30 Units Subcutaneous Daily Hughie Closs, MD   30 Units at 02/05/23 0939   nitroGLYCERIN (NITROSTAT) SL tablet 0.4 mg  0.4 mg  Sublingual Q5 min PRN Hughie Closs, MD       pantoprazole (PROTONIX) EC tablet 40 mg  40 mg Oral Daily Pahwani, Daleen Bo, MD   40 mg at 02/05/23 0813   polyethylene glycol (MIRALAX / GLYCOLAX) packet 17 g  17 g Oral BID PRN Hughie Closs, MD       senna-docusate (Senokot-S) tablet 1 tablet  1 tablet Oral BID PRN Hughie Closs, MD       sodium chloride flush (NS) 0.9 % injection 3 mL  3 mL Intravenous Q12H Smith, Rondell A, MD   3 mL at 02/05/23 0814   tamsulosin (FLOMAX) capsule 0.4 mg  0.4 mg Oral QPC breakfast Hughie Closs, MD   0.4 mg at 02/05/23 0813   trimethobenzamide (TIGAN) injection 200 mg  200 mg Intramuscular Q6H PRN Clydie Braun, MD         Discharge Medications: Please see discharge summary for  a list of discharge medications.  Relevant Imaging Results:  Relevant Lab Results:   Additional Information [id number]  Eduard Roux, LCSW

## 2023-02-05 NOTE — Care Management Important Message (Signed)
Important Message  Patient Details  Name: Fernando Hess MRN: [id number] Date of Birth: June 04, 1946   Medicare Important Message Given:  Yes     Dorena Bodo 02/05/2023, 2:26 PM

## 2023-02-05 NOTE — TOC Transition Note (Signed)
Transition of Care [facility]) - CM/SW Discharge Note   Patient Details  Name: ERYN STALLINS MRN: [id number] Date of Birth: 1946-01-10  [facility] CM/SW Contact:  Eduard Roux, LCSW Phone Number: 02/05/2023, 2:20 PM   Clinical Narrative:     CSW spoke with patient' spouse- CSW updated on bed offers and verbally provided medicare.gov ratings. She expressed interest in [facility]. CSW sent message to [facility] - waiting on response   Antony Blackbird, MSW, LCSW Clinical Social Worker     Final next level of care: Skilled Nursing Facility Barriers to Discharge: English as a second language teacher, SNF Pending bed offer   Patient Goals and CMS Choice      Discharge Placement                         Discharge Plan and Services Additional resources added to the After Visit Summary for   In-house Referral: Clinical Social Work                                   Social Determinants of Health (SDOH) Interventions SDOH Screenings   Food Insecurity: No Food Insecurity (02/03/2023)  Housing: Low Risk  (02/03/2023)  Transportation Needs: No Transportation Needs (02/03/2023)  Utilities: Not At Risk (02/03/2023)  Depression (PHQ2-9): Low Risk  (05/20/2019)  Tobacco Use: Low Risk  (02/02/2023)     Readmission Risk Interventions     No data to display

## 2023-02-05 NOTE — Progress Notes (Signed)
NIF -20 VC 1.8L  Pt had great effort!

## 2023-02-05 NOTE — Progress Notes (Signed)
Physical Therapy Treatment Patient Details Name: Fernando Hess MRN: [id number] DOB: 1946/05/31 Today's Date: 02/05/2023   History of Present Illness 77 y.o. male adm 02/02/2023 after falling out of bed. Found to be hypotensive with AKI, Acute respiratory failure with hypoxia and Afib. 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    PT Comments    Pt pleasant and assisted to don shoes EOB with pt stating he can walk and stand much better with shoes on. Pt able to walk in hall with RW and min assist. Pt reports desire for ST-SNF prior to return home since he and wife both use AD. Will continue to follow to maximize function and gait.    Recommendations for follow up therapy are one component of a multi-disciplinary discharge planning process, led by the attending physician.  Recommendations may be updated based on patient status, additional functional criteria and insurance authorization.  Follow Up Recommendations  Can patient physically be transported by private vehicle: Yes    Assistance Recommended at Discharge Intermittent Supervision/Assistance  Patient can return home with the following A little help with walking and/or transfers;A little help with bathing/dressing/bathroom;Assistance with cooking/housework;Assist for transportation;Help with stairs or ramp for entrance   Equipment Recommendations  None recommended by PT    Recommendations for Other Services       Precautions / Restrictions Precautions Precautions: Fall Restrictions Weight Bearing Restrictions: No     Mobility  Bed Mobility   Bed Mobility: Supine to Sit     Supine to sit: Min guard, HOB elevated     General bed mobility comments: HOB 25 degrees with increased time and pt able to pivot to left side without physical assist    Transfers Overall transfer level: Needs assistance   Transfers: Sit to/from Stand Sit to  Stand: Min assist           General transfer comment: guarding with cues for hand placement to rise from bed, min assist to rise from bed x 5 trials with assist for anterior translation and balance    Ambulation/Gait Ambulation/Gait assistance: Min guard Gait Distance (Feet): 110 Feet Assistive device: Rolling walker (2 wheels) Gait Pattern/deviations: Step-through pattern, Narrow base of support   Gait velocity interpretation: <1.8 ft/sec, indicate of risk for recurrent falls   General Gait Details: pt with narrow BOS, limited foot clearance and short stride needing 3 standing rests to widen BOS and adjust gait. Mod cues to look up and not stare at feet. Pt able to state fatigue and need to return to room   Stairs             Wheelchair Mobility    Modified Rankin (Stroke Patients Only)       Balance Overall balance assessment: Needs assistance Sitting-balance support: No upper extremity supported, Feet supported Sitting balance-Leahy Scale: Fair     Standing balance support: Bilateral upper extremity supported, Reliant on assistive device for balance Standing balance-Leahy Scale: Poor Standing balance comment: RW for gait                            Cognition Arousal/Alertness: Awake/alert Behavior During Therapy: WFL for tasks assessed/performed Overall Cognitive Status: Impaired/Different from baseline                       Memory: Decreased short-term memory  Problem Solving: Slow processing General Comments: pt needing mod cues to not stare at feet and cues for safety        Exercises General Exercises - Lower Extremity Long Arc Quad: AROM, Both, 20 reps, Seated, Strengthening Hip Flexion/Marching: AROM, Both, 15 reps, Seated, Strengthening    General Comments        Pertinent Vitals/Pain Pain Assessment Pain Assessment: No/denies pain    Home Living                          Prior Function             PT Goals (current goals can now be found in the care plan section) Progress towards PT goals: Progressing toward goals    Frequency    Min 1X/week      PT Plan Discharge plan needs to be updated    Co-evaluation              AM-PAC PT "6 Clicks" Mobility   Outcome Measure  Help needed turning from your back to your side while in a flat bed without using bedrails?: None Help needed moving from lying on your back to sitting on the side of a flat bed without using bedrails?: A Little Help needed moving to and from a bed to a chair (including a wheelchair)?: A Little Help needed standing up from a chair using your arms (e.g., wheelchair or bedside chair)?: A Little Help needed to walk in hospital room?: A Little Help needed climbing 3-5 steps with a railing? : A Lot 6 Click Score: 18    End of Session Equipment Utilized During Treatment: Gait belt Activity Tolerance: Patient tolerated treatment well Patient left: in chair;with call bell/phone within reach;with chair alarm set Nurse Communication: Mobility status PT Visit Diagnosis: Other abnormalities of gait and mobility (R26.89);Muscle weakness (generalized) (M62.81);Other (comment)     Time: 1610-9604 PT Time Calculation (min) (ACUTE ONLY): 25 min  Charges:  $Gait Training: 8-22 mins $Therapeutic Activity: 8-22 mins                     Merryl Hacker, PT Acute Rehabilitation Services Office: [phone]    Cristine Polio 02/05/2023, 10:38 AM

## 2023-02-05 NOTE — Progress Notes (Signed)
PROGRESS NOTE    Fernando Hess  WUJ:[id number] DOB: 03-May-1946 DOA: 02/02/2023 PCP: [name], MD   Brief Narrative:  HPI: Fernando Hess is a 77 y.o. male with medical history significant of paroxysmal atrial fibrillation, diastolic CHF, CADs/p CABG, diabetes mellitus type 2, CKD stage 3b, anxiety, reported history myasthenia gravis, recent right hip fracture, dementia, and OSA oxygen who presents after falling out of bed.  History is initially obtained from the patient who appears to be alert and oriented x 3.  He states that at baseline he lives with his wife at home and ambulates with use of a rolling walker and is not on oxygen at baseline.  He states that he had been in his normal state of health and was trying to get in bed last night when slipped off the mattress causing him to fall.  Denied any trauma to his head or loss of consciousness.  He denies having any recent fever, chills, nausea, vomiting, diarrhea, or dysuria.  Patient reports that his breathing is often affected from myasthenia gravis which she has had since the age of 37 and was diagnosed by Dr. [name] at [facility] in [place].      Wife notes that that he was not on oxygen at home, but had been placed on oxygen previously while at rest home. Patient previously had been on CPAP at night previously, but his machine broke about 3 years ago  and was not longer covered by insurance. He does not have the antibody in his blood like others with the condition of myasthenia gravis. They had previously tried on infusions twice last year.  She had found him yesterday evening on the side of the bed and thought that he likely slid down onto his bottom.  She reports that he fall also almost daily and she has to call EMS out in order to get him up.  Patient had fallen back in April fractured his right hip requiring right hip hemiarthroplasty. Yesterday patient seemed to be disoriented following the fall when she was asking  him questions which made her more concerned.  She felt like he could possibly be dehydrated.   EMS had reported the patient to be hypotensive and he had been given 700 mL of normal saline IV fluids prior to arrival.   In the emergency department patient was noted to be afebrile with respirations into the 40s, blood pressure as low as 96/57 with improvement up to 155/78 with IV fluids, and O2 saturations maintained on room air.  Initial pH did not show any signs of hypercapnia labs from 5/20 significant for WBC 12.9, BUN 33, creatinine 2.25, calcium 8.6, ionized calcium 1.13, albumin 2.6, and initial lactic acid 3.1.  Chest x-ray showed no active disease. CT scan of the head did not note any acute abnormality.  Blood cultures had been obtained.  Urinalysis was ordered.  PCCM had evaluated the patient due to questionable history of myasthenia gravis with tachypnea, low NIF, and weakness patient had been given 1 L of lactated Ringer's and  Rocephin IV.    Assessment & Plan:   Principal Problem:   SIRS (systemic inflammatory response syndrome) ([name]) Active Problems:   Transient hypotension   Acute metabolic encephalopathy   Dementia without behavioral disturbance ([name])   Generalized weakness   Fall at home, initial encounter   Acute kidney injury superimposed on chronic kidney disease ([name])   Acute respiratory failure with hypoxia ([name])   Diabetes mellitus, type 2 (  [name])   Myasthenia gravis ([name])   Hypokalemia   Prolonged QT interval  SIRS/dehydration Upon arrival to the emergency department patient was noted to be tachypneic with WBC elevated 12.5 and initial lactic acid 3.1.  Patient had been bolused IV fluids and started on empiric antibiotics Rocephin IV.  Lactic acid trended down to 2.5->1.6.  Based on his clinical picture, I do not think we are dealing with any infection here. UA and chest x-ray unremarkable, procalcitonin unremarkable.  I think his presentation was more likely secondary to  dehydration, will discontinue antibiotics.  He appears to be well-hydrated.  No more IV fluids needed.   Transient hypotension Patient reported to be hypotensive prior to arrival to the hospital given 700 mL fluid bolus in route.  Was elevated at times but now normal.  Resume home dose of hydralazine 25 mg 3 times daily.   Acute metabolic encephalopathy Dementia Wife notes patient has been acutely altered.  Upon arrival to ED, he was fully alert and oriented.  CT scan of the head did not note any acute intercranial abnormality.  He is fully alert and oriented now as well.   Fall /generalized weakness/left leg weakness Acute on chronic.  Patient noted to have fallen while trying to get in bed and was noted to be significantly weak.  At baseline patient ambulates with use of a rolling walker.  Wife notes frequent falls at baseline.  Reported history of myasthenia gravis.  Admitting hospitalist had suspected possible myasthenia gravis exacerbation.  Patient was seen by neurology, he was not thought to have myasthenia gravis exacerbation.  Neurology signed off.  On my examination, patient appears to have equal strength in all extremities.  I think his presentation is more likely due to deconditioning and dehydration.  He has been seen by PT OT and they recommended SNF.  I discussed with the patient, he is agreeable to going to SNF except 2 facilities.  I have consulted TOC.  Pending placement.   CKD stage IV/AKI ruled out Upon extensive review, it appears that patient's creatinine is fluctuating and is ranging between 1.5-2.2.  Currently 2.1.  Back at his baseline.   Acute respiratory failure with hypoxia: Chest x-ray unremarkable.  Myasthenia gravis was thought to be suspected.  Patient complained of shortness of breath.  Chest x-ray and procalcitonin unremarkable.  I wonder if this is secondary to atelectasis.  Incentive spirometry is ordered but was not given to the patient.  Primary RN was advised to  do that.  Patient is now saturating 98% on room air.   Paroxysmal atrial fibrillation on chronic anticoagulation Asymptomatic and in sinus rhythm.  CHA2DS2-VASc score = >4. -Continue Eliquis   Hypokalemia: Resolved.  Chronic hypocalcemia Received some gluconate.  No indication for replacement at this point in time.   Prolonged QT interval QTc 558. -Correct electrolyte abnormalities -Avoid QT prolonging medications   Diastolic congestive heart failure = Last echocardiogram noted EF of 60-65% with grade 1 diastolic dysfunction on 12/2022.  Appears slightly dehydrated.  Gentle IV hydration, strict I's and O's.   Uncontrolled diabetes mellitus type 2 Last available hemoglobin A1c was 10.7 on 09/18/2022.  Was started on SSI, hypoglycemic.  Takes 20 units of Tresiba at home.  Will resume that here and continue SSI.   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  Currently asymptomatic.  Follow-up outpatient with PCP.   GERD Continue PPI.   OSA Patient no longer on CPAP after was  not covered by his insurance.  DVT prophylaxis: Eliquis   Code Status: Full Code  Family Communication:  None present at bedside.  Plan of care discussed with patient in length and he/she verbalized understanding and agreed with it.  Updated his wife over the phone yesterday.  Status is: Inpatient Remains inpatient appropriate because: Patient is now medically stable but needs SNF.  Pending placement.   Estimated body mass index is 29.89 kg/m as calculated from the following:   Height as of this encounter: 5\' 6"  (1.676 m).   Weight as of this encounter: 84 kg.    Nutritional Assessment: Body mass index is 29.89 kg/m.[name] Seen by dietician.  I agree with the assessment and plan as outlined below: Nutrition Status:        . Skin Assessment: I have examined the patient's skin and I agree with the wound assessment as performed by the wound care RN as outlined below:     Consultants:  Neurology-signed off  Procedures:  None  Antimicrobials:  Anti-infectives (From admission, onward)    Start     Dose/Rate Route Frequency Ordered Stop   02/04/23 0400  cefTRIAXone (ROCEPHIN) 1 g in sodium chloride 0.9 % 100 mL IVPB        1 g 200 mL/hr over 30 Minutes Intravenous Every 24 hours 02/03/23 0756     02/03/23 0100  cefTRIAXone (ROCEPHIN) 1 g in sodium chloride 0.9 % 100 mL IVPB        1 g 200 mL/hr over 30 Minutes Intravenous  Once 02/03/23 0053 02/03/23 0227         Subjective: Seen and examined earlier today.  He has no complaints.  He is fully alert and oriented.  Feeling well.  Objective: Vitals:   02/05/23 0415 02/05/23 0755 02/05/23 0942 02/05/23 1127  BP: (!) 131/97 (!) 162/88 (!) 125/59 124/74  Pulse: (!) 106 100 84 94  Resp: 16 20 20 16   Temp: 98.4 F (36.9 C) 98.5 F (36.9 C)  98.6 F (37 C)  TempSrc: Oral Oral  Oral  SpO2: 100% 98%  100%  Weight: 84 kg     Height:        Intake/Output Summary (Last 24 hours) at 02/05/2023 1238 Last data filed at 02/05/2023 1130 Gross per 24 hour  Intake 2503.76 ml  Output 2650 ml  Net -146.24 ml    Filed Weights   02/02/23 2153 02/04/23 0650 02/05/23 0415  Weight: 85 kg 81.6 kg 84 kg    Examination:  General exam: Appears calm and comfortable  Respiratory system: Clear to auscultation. Respiratory effort normal. Cardiovascular system: S1 & S2 heard, RRR. No JVD, murmurs, rubs, gallops or clicks. No pedal edema. Gastrointestinal system: Abdomen is nondistended, soft and nontender. No organomegaly or masses felt. Normal bowel sounds heard. Central nervous system: Alert and oriented. No focal neurological deficits. Extremities: Symmetric 5 x 5 power. Skin: No rashes, lesions or ulcers.   Data Reviewed: I have personally reviewed following labs and imaging studies  CBC: Recent Labs  Lab 02/02/23 2245 02/02/23 2316 02/03/23 0613 02/04/23 0202 02/05/23 0142  WBC 12.9*  --  12.5*  9.9 10.3  NEUTROABS 9.5*  --   --   --  7.2  HGB 11.8* 12.2* 11.4* 11.5* 11.5*  HCT 36.5* 36.0* 35.9* 35.2* 36.2*  MCV 90.8  --  91.1 90.3 90.7  PLT 310  --  269 260 256    Basic Metabolic Panel: Recent Labs  Lab 02/02/23 2245  02/02/23 2316 02/03/23 0613 02/04/23 0202 02/05/23 0142  NA 143 144 143 135 135  K 3.7 3.6 3.1* 4.5 3.6  CL 107  --  109 104 107  CO2 25  --  25 25 20*  GLUCOSE 136*  --  96 245* 320*  BUN 33*  --  31* 28* 24*  CREATININE 2.25*  --  2.00* 2.09* 1.66*  CALCIUM 8.6*  --  8.2* 8.7* 8.4*    GFR: Estimated Creatinine Clearance: 37.3 mL/min (A) (by C-G formula based on SCr of 1.66 mg/dL (H)). Liver Function Tests: Recent Labs  Lab 02/02/23 2245  AST 32  ALT 24  ALKPHOS 122  BILITOT 0.7  PROT 5.3*  ALBUMIN 2.6*    No results for input(s): "LIPASE", "AMYLASE" in the last 168 hours. No results for input(s): "AMMONIA" in the last 168 hours. Coagulation Profile: No results for input(s): "INR", "PROTIME" in the last 168 hours. Cardiac Enzymes: Recent Labs  Lab 02/03/23 0618  CKTOTAL 52    BNP (last 3 results) No results for input(s): "PROBNP" in the last 8760 hours. HbA1C: No results for input(s): "HGBA1C" in the last 72 hours. CBG: Recent Labs  Lab 02/04/23 1549 02/04/23 2112 02/05/23 0555 02/05/23 0800 02/05/23 1128  GLUCAP 297* 197* 222* 242* 212*    Lipid Profile: No results for input(s): "CHOL", "HDL", "LDLCALC", "TRIG", "CHOLHDL", "LDLDIRECT" in the last 72 hours. Thyroid Function Tests: No results for input(s): "TSH", "T4TOTAL", "FREET4", "T3FREE", "THYROIDAB" in the last 72 hours. Anemia Panel: No results for input(s): "VITAMINB12", "FOLATE", "FERRITIN", "TIBC", "IRON", "RETICCTPCT" in the last 72 hours. Sepsis Labs: Recent Labs  Lab 02/02/23 2352 02/03/23 0156 02/03/23 0613 02/04/23 0202  PROCALCITON  --   --   --  <0.10  LATICACIDVEN 3.1* 2.5* 1.6  --      Recent Results (from the past 240 hour(s))  Urine  Culture     Status: None   Collection Time: 02/03/23 12:53 AM   Specimen: Urine, Catheterized  Result Value Ref Range Status   Specimen Description URINE, CATHETERIZED  Final   Special Requests NONE  Final   Culture   Final    NO GROWTH Performed at [facility], [address] [place], [name] [address]    Report Status 02/04/2023 FINAL  Final  Culture, blood (routine x 2)     Status: None (Preliminary result)   Collection Time: 02/03/23  1:57 AM   Specimen: BLOOD RIGHT HAND  Result Value Ref Range Status   Specimen Description BLOOD RIGHT HAND  Final   Special Requests   Final    BOTTLES DRAWN AEROBIC AND ANAEROBIC Blood Culture results may not be optimal due to an excessive volume of blood received in culture bottles   Culture   Final    NO GROWTH 2 DAYS Performed at [facility], [address], [place], [name] [address]    Report Status PENDING  Incomplete  Culture, blood (routine x 2)     Status: None (Preliminary result)   Collection Time: 02/03/23  1:57 AM   Specimen: BLOOD LEFT HAND  Result Value Ref Range Status   Specimen Description BLOOD LEFT HAND  Final   Special Requests   Final    BOTTLES DRAWN AEROBIC AND ANAEROBIC Blood Culture adequate volume   Culture   Final    NO GROWTH 2 DAYS Performed at [facility], [address], [place], [name] [address]    Report Status PENDING  Incomplete     Radiology Studies: No results found.  Scheduled Meds:  apixaban  5 mg Oral BID   atorvastatin  80 mg Oral QPM   buPROPion  150 mg Oral q morning   DULoxetine  30 mg Oral q morning   insulin aspart  0-15 Units Subcutaneous TID WC   insulin aspart  0-5 Units Subcutaneous QHS   insulin glargine-yfgn  30 Units Subcutaneous Daily   pantoprazole  40 mg Oral Daily   sodium chloride flush  3 mL Intravenous Q12H   tamsulosin  0.4 mg Oral QPC breakfast   Continuous Infusions:  cefTRIAXone (ROCEPHIN)  IV 1 g (02/05/23 0421)     LOS: 2 days   Hughie Closs, MD [facility]  02/05/2023, 12:38 PM   *Please note that this is a verbal dictation therefore any spelling or grammatical errors are due to the "Dragon Medical One" system interpretation.  Please page via Amion and do not message via secure chat for urgent patient care matters. Secure chat can be used for non urgent patient care matters.  How to contact the [facility] Attending or Consulting provider 7A - 7P or covering provider during after hours 7P -7A, for this patient?  Check the care team in [facility] and look for a) attending/consulting TRH provider listed and b) the [facility] team listed. Page or secure chat 7A-7P. Log into [email or web address] and use [facility] universal password to access. If you do not have the password, please contact the hospital operator. Locate the [facility] provider you are looking for under [facility] and page to a number that you can be directly reached. If you still have difficulty reaching the provider, please page the [facility] (Director on Call) for the Hospitalists listed on amion for assistance.

## 2023-02-05 NOTE — Progress Notes (Signed)
       RE:   Fernando Hess    Date of Birth:  1946-08-25   Date:   02/05/2023      To Whom It May Concern:  Please be advised that the above-named patient will require a short-term nursing home stay - anticipated 30 days or less for rehabilitation and strengthening.  The plan is for return home.

## 2023-02-05 NOTE — Progress Notes (Signed)
NIF -20  VC 1.6L Good patient effort

## 2023-02-05 NOTE — NC FL2 (Signed)
[place] MEDICAID FL2 LEVEL OF CARE FORM     IDENTIFICATION  Patient Name: Fernando Hess Birthdate: 10-Jul-1946 Sex: male Admission Date (Current Location): 02/02/2023  [facility] and [place] Number:  Producer, television/film/video and Address:  [facility] [place], [address] [place], [name] [address]      Provider Number: [id number]  Attending Physician Name and Address:  Hughie Closs, MD  Relative Name and Phone Number:       Current Level of Care:   Recommended Level of Care: Skilled Nursing Facility Prior Approval Number:    Date Approved/Denied:   PASRR Number: pending  Discharge Plan: SNF    Current Diagnoses: Patient Active Problem List   Diagnosis Date Noted   Acute kidney injury superimposed on chronic kidney disease ([name]) 02/03/2023   Transient hypotension 02/03/2023   SIRS (systemic inflammatory response syndrome) ([name]) 02/03/2023   Dementia without behavioral disturbance ([name]) 02/03/2023   Fall at home, initial encounter 02/03/2023   Acute respiratory failure with hypoxia ([name]) 02/03/2023   Hypokalemia 02/03/2023   Prolonged QT interval 02/03/2023   Left ankle pain 01/08/2023   Hematuria 01/08/2023   Fracture of femoral neck, right, closed ([name]) 12/24/2022   Depression with anxiety 12/24/2022   Acute metabolic encephalopathy 09/18/2022   Acute encephalopathy 09/17/2022   AKI (acute kidney injury) ([name]) 09/04/2022   Generalized weakness 08/12/2022   Myasthenia gravis ([name]) 04/21/2022   Chronic kidney disease, stage 3b ([name]) 03/21/2022   Posterior vitreous detachment of both eyes 01/23/2022   Constipation 11/13/2021   Diabetes mellitus, type 2 ([name]) 11/13/2021   Severe major depression, single episode, without psychotic features ([name]) 11/13/2021   Amaurosis fugax 11/13/2021   Amnesia 11/13/2021   Anxiety 11/13/2021   Cardiac arrhythmia 11/13/2021   Hearing loss 11/13/2021   Hypercoagulable state ([name]) 11/13/2021   Hyperparathyroidism  due to renal insufficiency ([name]) 11/13/2021   Mild aortic stenosis 11/05/2021   Paroxysmal atrial fibrillation ([name]) 11/05/2021   Nasal septal deviation 08/30/2021   Anticoagulated by anticoagulation treatment 07/16/2021   Syncope 05/10/2021   Gastro-esophageal reflux disease without esophagitis 07/25/2020   Globus pharyngeus 07/25/2020   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) 12/21/2019   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   Chronic heart failure with preserved ejection fraction (HFpEF) ([name]) 11/18/2019   OSA on CPAP 03/07/2019   Peripheral artery disease    Intractable vascular headache 11/11/2018   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   Coronary artery disease involving native coronary artery of native heart without angina pectoris 09/24/2015   Moderate nonproliferative diabetic retinopathy of left eye with macular edema associated with type 2 diabetes mellitus ([name]) 03/01/2008   Hyperlipidemia LDL goal <70    Essential hypertension    History of kidney stones     Orientation RESPIRATION BLADDER Height & Weight     Self, Situation, Place  Normal Continent, External catheter Weight: 185 lb 3 oz (84 kg) Height:  5\' 6"  (167.6 cm)  BEHAVIORAL SYMPTOMS/MOOD NEUROLOGICAL BOWEL NUTRITION STATUS      Continent Diet (see d/c summary)  AMBULATORY STATUS COMMUNICATION OF NEEDS Skin   Extensive Assist Verbally Normal                       Personal Care Assistance Level of Assistance  Bathing, Feeding, Dressing Bathing Assistance: Limited assistance Feeding assistance: Independent Dressing Assistance: Limited assistance     Functional Limitations Info  Sight, Hearing,  Speech Sight Info: Impaired Hearing Info: Adequate Speech Info: Adequate    SPECIAL CARE FACTORS FREQUENCY  PT (By licensed PT), OT (By licensed OT)     PT Frequency: 5x/week OT Frequency: 5x/week            Contractures Contractures Info: Not present     Additional Factors Info  Code Status, Allergies Code Status Info: full code Allergies Info: Cilostazol, Dulaglutide, Levofloxacin, Liraglutide, lisinopril           Current Medications (02/05/2023):  This is the current hospital active medication list Current Facility-Administered Medications  Medication Dose Route Frequency Provider Last Rate Last Admin   acetaminophen (TYLENOL) tablet 650 mg  650 mg Oral Q6H PRN Madelyn Flavors A, MD   650 mg at 02/04/23 2150   Or   acetaminophen (TYLENOL) suppository 650 mg  650 mg Rectal Q6H PRN Madelyn Flavors A, MD       albuterol (PROVENTIL) (2.5 MG/3ML) 0.083% nebulizer solution 2.5 mg  2.5 mg Nebulization Q4H PRN Clydie Braun, MD       apixaban (ELIQUIS) tablet 5 mg  5 mg Oral BID Madelyn Flavors A, MD   5 mg at 02/05/23 0813   atorvastatin (LIPITOR) tablet 80 mg  80 mg Oral QPM Pahwani, Daleen Bo, MD   80 mg at 02/04/23 1726   buPROPion (WELLBUTRIN XL) 24 hr tablet 150 mg  150 mg Oral q morning Hughie Closs, MD   150 mg at 02/05/23 0813   cefTRIAXone (ROCEPHIN) 1 g in sodium chloride 0.9 % 100 mL IVPB  1 g Intravenous Q24H Smith, Rondell A, MD 200 mL/hr at 02/05/23 0421 1 g at 02/05/23 0421   DULoxetine (CYMBALTA) DR capsule 30 mg  30 mg Oral q morning Hughie Closs, MD   30 mg at 02/05/23 0813   hydrALAZINE (APRESOLINE) injection 10 mg  10 mg Intravenous Q4H PRN Howerter, Justin B, DO   10 mg at 02/04/23 2159   hydrALAZINE (APRESOLINE) tablet 25 mg  25 mg Oral Q6H PRN Hughie Closs, MD   25 mg at 02/05/23 0813   insulin aspart (novoLOG) injection 0-15 Units  0-15 Units Subcutaneous TID WC Madelyn Flavors A, MD   5 Units at 02/05/23 0814   insulin aspart (novoLOG) injection 0-5 Units  0-5 Units Subcutaneous QHS Madelyn Flavors A, MD   2 Units at 02/03/23 2145   insulin glargine-yfgn (SEMGLEE) injection 30 Units  30 Units Subcutaneous Daily Hughie Closs, MD   30 Units at 02/05/23 0939   nitroGLYCERIN (NITROSTAT) SL tablet 0.4 mg  0.4 mg Sublingual  Q5 min PRN Hughie Closs, MD       pantoprazole (PROTONIX) EC tablet 40 mg  40 mg Oral Daily Pahwani, Daleen Bo, MD   40 mg at 02/05/23 0813   polyethylene glycol (MIRALAX / GLYCOLAX) packet 17 g  17 g Oral BID PRN Hughie Closs, MD       senna-docusate (Senokot-S) tablet 1 tablet  1 tablet Oral BID PRN Hughie Closs, MD       sodium chloride flush (NS) 0.9 % injection 3 mL  3 mL Intravenous Q12H Smith, Rondell A, MD   3 mL at 02/05/23 0814   tamsulosin (FLOMAX) capsule 0.4 mg  0.4 mg Oral QPC breakfast Hughie Closs, MD   0.4 mg at 02/05/23 0813   trimethobenzamide (TIGAN) injection 200 mg  200 mg Intramuscular Q6H PRN Clydie Braun, MD         Discharge Medications: Please see discharge summary  for a list of discharge medications.  Relevant Imaging Results:  Relevant Lab Results:   Additional Information [id number]  Erin Sons, LCSW

## 2023-02-06 DIAGNOSIS — R6510 Systemic inflammatory response syndrome (SIRS) of non-infectious origin without acute organ dysfunction: Secondary | ICD-10-CM | POA: Diagnosis not present

## 2023-02-06 LAB — GLUCOSE, CAPILLARY
Glucose-Capillary: 119 mg/dL — ABNORMAL HIGH (ref 70–99)
Glucose-Capillary: 223 mg/dL — ABNORMAL HIGH (ref 70–99)
Glucose-Capillary: 194 mg/dL — ABNORMAL HIGH (ref 70–99)
Glucose-Capillary: 192 mg/dL — ABNORMAL HIGH (ref 70–99)
Glucose-Capillary: 207 mg/dL — ABNORMAL HIGH (ref 70–99)
Glucose-Capillary: 104 mg/dL — ABNORMAL HIGH (ref 70–99)

## 2023-02-06 LAB — HEMOGLOBIN A1C
Hgb A1c MFr Bld: 10.9 % — ABNORMAL HIGH (ref 4.8–5.6)
Mean Plasma Glucose: 266 mg/dL

## 2023-02-06 LAB — CULTURE, BLOOD (ROUTINE X 2): Special Requests: ADEQUATE

## 2023-02-06 NOTE — TOC Progression Note (Signed)
Transition of Care [facility]) - Progression Note    Patient Details  Name: KHAMANI FABRY MRN: [id number] Date of Birth: 1945/10/12  Transition of Care [facility]) CM/SW Contact  Eduard Roux, [name] Phone Number: 02/06/2023, 1:26 PM  Clinical Narrative:     CSW spoke with the patient's spouse- she not pleased with offers, declined bed offers,  states she will take the patient home. CSW advised per MD, patient is medically stable. She reports having a stair lift installed to assist patient getting upstairs tomorrow and inquired if patient can stay 1-2 more days. Requested CSW pass along inquiry to the MD- also requested wheelchair and to inform [facility] agency she is allergic to polyester .   TOC will continue to follow and assist with discharge planning.  Antony Blackbird, MSW, LCSW Clinical Social Worker    Expected Discharge Plan: Skilled Nursing Facility Barriers to Discharge: English as a second language teacher, SNF Pending bed offer  Expected Discharge Plan and Services In-house Referral: Clinical Social Work     Living arrangements for the past 2 months: Single Family Home                                       Social Determinants of Health (SDOH) Interventions SDOH Screenings   Food Insecurity: No Food Insecurity (02/03/2023)  Housing: Low Risk  (02/03/2023)  Transportation Needs: No Transportation Needs (02/03/2023)  Utilities: Not At Risk (02/03/2023)  Depression (PHQ2-9): Low Risk  (05/20/2019)  Tobacco Use: Low Risk  (02/02/2023)    Readmission Risk Interventions     No data to display

## 2023-02-06 NOTE — Plan of Care (Signed)
  Problem: Education: Goal: Ability to describe self-care measures that may prevent or decrease complications (Diabetes Survival Skills Education) will improve Outcome: Progressing   Problem: Education: Goal: Ability to describe self-care measures that may prevent or decrease complications (Diabetes Survival Skills Education) will improve Outcome: Progressing Goal: Individualized Educational Video(s) Outcome: Progressing   Problem: Coping: Goal: Ability to adjust to condition or change in health will improve Outcome: Progressing   Problem: Fluid Volume: Goal: Ability to maintain a balanced intake and output will improve Outcome: Progressing   Problem: Health Behavior/Discharge Planning: Goal: Ability to identify and utilize available resources and services will improve Outcome: Progressing Goal: Ability to manage health-related needs will improve Outcome: Progressing   Problem: Metabolic: Goal: Ability to maintain appropriate glucose levels will improve Outcome: Progressing   Problem: Nutritional: Goal: Maintenance of adequate nutrition will improve Outcome: Progressing Goal: Progress toward achieving an optimal weight will improve Outcome: Progressing   Problem: Skin Integrity: Goal: Risk for impaired skin integrity will decrease Outcome: Progressing   Problem: Tissue Perfusion: Goal: Adequacy of tissue perfusion will improve Outcome: Progressing   Problem: Education: Goal: Knowledge of General Education information will improve Description: Including pain rating scale, medication(s)/side effects and non-pharmacologic comfort measures Outcome: Progressing   Problem: Health Behavior/Discharge Planning: Goal: Ability to manage health-related needs will improve Outcome: Progressing   Problem: Clinical Measurements: Goal: Ability to maintain clinical measurements within normal limits will improve Outcome: Progressing Goal: Will remain free from infection Outcome:  Progressing Goal: Diagnostic test results will improve Outcome: Progressing Goal: Respiratory complications will improve Outcome: Progressing Goal: Cardiovascular complication will be avoided Outcome: Progressing   Problem: Activity: Goal: Risk for activity intolerance will decrease Outcome: Progressing   Problem: Nutrition: Goal: Adequate nutrition will be maintained Outcome: Progressing   Problem: Coping: Goal: Level of anxiety will decrease Outcome: Progressing   Problem: Elimination: Goal: Will not experience complications related to bowel motility Outcome: Progressing Goal: Will not experience complications related to urinary retention Outcome: Progressing   Problem: Pain Managment: Goal: General experience of comfort will improve Outcome: Progressing   Problem: Safety: Goal: Ability to remain free from injury will improve Outcome: Progressing   Problem: Skin Integrity: Goal: Risk for impaired skin integrity will decrease Outcome: Progressing   

## 2023-02-06 NOTE — Progress Notes (Signed)
NIF -40 FVC 1.7 Good pt effort

## 2023-02-06 NOTE — Progress Notes (Signed)
NIF-35 VC-1.9L  With great effort.

## 2023-02-06 NOTE — Progress Notes (Signed)
   Durable Medical Equipment (From admission, onward)        Start     Ordered  02/06/23 1513  For home use only DME lightweight manual wheelchair with seat cushion  Once      Comments: Patient suffers from frequent falls/weakness which impairs their ability to perform daily activities like dressing, feeding, and grooming in the home.  A walker will not resolve  issue with performing activities of daily living. A wheelchair will allow patient to safely perform daily activities. Patient is not able to propel themselves in the home using a standard weight wheelchair due to general weakness. Patient can self propel in the lightweight wheelchair. Length of need Lifetime. Accessories: elevating leg rests (ELRs), wheel locks, extensions and anti-tippers. Back cushion  02/06/23 1513

## 2023-02-06 NOTE — Progress Notes (Signed)
Occupational Therapy Treatment Patient Details Name: Fernando Hess MRN: [id number] DOB: 12/18/1945 Today's Date: 02/06/2023   History of present illness 77 y.o. male adm 02/02/2023 after falling out of bed. Found to be hypotensive with AKI, Acute respiratory failure with hypoxia and Afib. 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   OT comments  Patient making good progress with OT treatment, requiring less assistance than last OT session. Patient able to stand and perform mobility with min guard assist and stand at sink for grooming tasks. Static standing performed with patient performed with patient performing reaching tasks with min guard assist to simulate standing ADLs and tolerated 3 minutes x2 attempts. Patient will benefit from continued inpatient follow up therapy, <3 hours/day to increase activity tolerance, safety, and increase independence with self care. Acute OT to continue to follow.    Recommendations for follow up therapy are one component of a multi-disciplinary discharge planning process, led by the attending physician.  Recommendations may be updated based on patient status, additional functional criteria and insurance authorization.    Assistance Recommended at Discharge Frequent or constant Supervision/Assistance  Patient can return home with the following  Assistance with cooking/housework;Direct supervision/assist for medications management;Direct supervision/assist for financial management;Assist for transportation;Help with stairs or ramp for entrance;A little help with walking and/or transfers;A lot of help with bathing/dressing/bathroom   Equipment Recommendations  Other (comment) (defer)    Recommendations for Other Services      Precautions / Restrictions Precautions Precautions: Fall Restrictions Weight Bearing Restrictions: No       Mobility Bed Mobility Overal bed  mobility: Modified Independent             General bed mobility comments: OOB in recliner    Transfers Overall transfer level: Needs assistance Equipment used: Rollator (4 wheels) Transfers: Sit to/from Stand, Bed to chair/wheelchair/BSC Sit to Stand: Min guard           General transfer comment: min guard for sit to stand with cues for hand placement and body mechanics     Balance Overall balance assessment: Needs assistance Sitting-balance support: No upper extremity supported, Feet supported Sitting balance-Leahy Scale: Fair Sitting balance - Comments: seated in recliner   Standing balance support: Single extremity supported, Bilateral upper extremity supported, During functional activity Standing balance-Leahy Scale: Poor Standing balance comment: perform grooming tasks at sink with one extremity support and performed reaching tasks                           ADL either performed or assessed with clinical judgement   ADL Overall ADL's : Needs assistance/impaired     Grooming: Min guard;Standing Grooming Details (indicate cue type and reason): at sink                 Toilet Transfer: Diplomatic Services operational officer (4 wheels) Toilet Transfer Details (indicate cue type and reason): simulated in room to recliner           General ADL Comments: stood x2 from recliner to simulate standing ADLs with patient tolerating 3 minutes each attempt    Extremity/Trunk Assessment              Vision       Perception     Praxis      Cognition Arousal/Alertness: Awake/alert Behavior During Therapy: WFL for tasks assessed/performed Overall Cognitive Status: Impaired/Different from baseline Area of Impairment:  Safety/judgement, Problem solving                     Memory: Decreased short-term memory   Safety/Judgement: Decreased awareness of safety, Decreased awareness of deficits   Problem Solving: Slow processing General Comments: concerned  over discharge situation, does not want to go to SNF but does not have enough assistance at home        Exercises      Shoulder Instructions       General Comments VSS on RA    Pertinent Vitals/ Pain       Pain Assessment Pain Assessment: No/denies pain Pain Intervention(s): Monitored during session  Home Living                                          Prior Functioning/Environment              Frequency  Min 2X/week        Progress Toward Goals  OT Goals(current goals can now be found in the care plan section)  Progress towards OT goals: Progressing toward goals  Acute Rehab OT Goals Patient Stated Goal: go home OT Goal Formulation: With patient Time For Goal Achievement: 02/18/23 Potential to Achieve Goals: Good ADL Goals Pt Will Perform Grooming: with set-up;sitting Pt Will Perform Lower Body Dressing: with min guard assist;sit to/from stand Pt Will Transfer to Toilet: bedside commode;ambulating;with min guard assist Pt Will Perform Toileting - Clothing Manipulation and hygiene: with min guard assist;sit to/from stand Additional ADL Goal #1: Pt will tolerate standing ADLs for 8 minutes without a seated rest break  Plan Discharge plan remains appropriate    Co-evaluation                 AM-PAC OT "6 Clicks" Daily Activity     Outcome Measure   Help from another person eating meals?: A Little Help from another person taking care of personal grooming?: A Little Help from another person toileting, which includes using toliet, bedpan, or urinal?: A Lot Help from another person bathing (including washing, rinsing, drying)?: A Lot Help from another person to put on and taking off regular upper body clothing?: A Little Help from another person to put on and taking off regular lower body clothing?: A Lot 6 Click Score: 15    End of Session Equipment Utilized During Treatment: Gait belt;Rollator (4 wheels)  OT Visit Diagnosis:  Other abnormalities of gait and mobility (R26.89);Muscle weakness (generalized) (M62.81);History of falling (Z91.81);Other symptoms and signs involving cognitive function   Activity Tolerance Patient tolerated treatment well   Patient Left in chair;with call bell/phone within reach;with chair alarm set   Nurse Communication Mobility status        Time: 5784-6962 OT Time Calculation (min): 32 min  Charges: OT General Charges $OT Visit: 1 Visit OT Treatments $Self Care/Home Management : 8-22 mins $Therapeutic Activity: 8-22 mins  Alfonse Flavors, OTA Acute Rehabilitation Services  Office [phone]   Dewain Penning 02/06/2023, 12:15 PM

## 2023-02-06 NOTE — Progress Notes (Signed)
PROGRESS NOTE    [name]  ZOX:[id number] DOB: [birth year] DOA: 02/02/2023 PCP: [name], MD   Brief Narrative:  HPI: Fernando Hess is a 77 y.o. male with medical history significant of paroxysmal atrial fibrillation, diastolic CHF, CADs/p CABG, diabetes mellitus type 2, CKD stage 3b, anxiety, reported history myasthenia gravis, recent right hip fracture, dementia, and OSA oxygen who presents after falling out of bed.  History is initially obtained from the patient who appears to be alert and oriented x 3.  He states that at baseline he lives with his wife at home and ambulates with use of a rolling walker and is not on oxygen at baseline.  He states that he had been in his normal state of health and was trying to get in bed last night when slipped off the mattress causing him to fall.  Denied any trauma to his head or loss of consciousness.  He denies having any recent fever, chills, nausea, vomiting, diarrhea, or dysuria.  Patient reports that his breathing is often affected from myasthenia gravis which she has had since the age of 28 and was diagnosed by Dr. [name] at [facility] in [place].      Wife notes that that he was not on oxygen at home, but had been placed on oxygen previously while at rest home. Patient previously had been on CPAP at night previously, but his machine broke about 3 years ago  and was not longer covered by insurance. He does not have the antibody in his blood like others with the condition of myasthenia gravis. They had previously tried on infusions twice last year.  She had found him yesterday evening on the side of the bed and thought that he likely slid down onto his bottom.  She reports that he fall also almost daily and she has to call EMS out in order to get him up.  Patient had fallen back in April fractured his right hip requiring right hip hemiarthroplasty. Yesterday patient seemed to be disoriented following the fall when she was asking  him questions which made her more concerned.  She felt like he could possibly be dehydrated.   EMS had reported the patient to be hypotensive and he had been given 700 mL of normal saline IV fluids prior to arrival.   In the emergency department patient was noted to be afebrile with respirations into the 40s, blood pressure as low as 96/57 with improvement up to 155/78 with IV fluids, and O2 saturations maintained on room air.  Initial pH did not show any signs of hypercapnia labs from 5/20 significant for WBC 12.9, BUN 33, creatinine 2.25, calcium 8.6, ionized calcium 1.13, albumin 2.6, and initial lactic acid 3.1.  Chest x-ray showed no active disease. CT scan of the head did not note any acute abnormality.  Blood cultures had been obtained.  Urinalysis was ordered.  PCCM had evaluated the patient due to questionable history of myasthenia gravis with tachypnea, low NIF, and weakness patient had been given 1 L of lactated Ringer's and  Rocephin IV.    Assessment & Plan:   Principal Problem:   SIRS (systemic inflammatory response syndrome) ([name]) Active Problems:   Transient hypotension   Acute metabolic encephalopathy   Dementia without behavioral disturbance ([name])   Generalized weakness   Fall at home, initial encounter   Acute kidney injury superimposed on chronic kidney disease ([name])   Acute respiratory failure with hypoxia ([name])   Diabetes mellitus, type 2 (  [name])   Myasthenia gravis ([name])   Hypokalemia   Prolonged QT interval  SIRS/dehydration Upon arrival to the emergency department patient was noted to be tachypneic with WBC elevated 12.5 and initial lactic acid 3.1.  Patient had been bolused IV fluids and started on empiric antibiotics Rocephin IV.  Lactic acid trended down to 2.5->1.6.  Based on his clinical picture, I do not think we are dealing with any infection here. UA and chest x-ray unremarkable, procalcitonin unremarkable.  I think his presentation was more likely secondary to  dehydration, antibiotics discontinued.  He appears to be well-hydrated and stable.   Transient hypotension Patient reported to be hypotensive prior to arrival to the hospital given 700 mL fluid bolus in route.  Blood pressure fairly stable, continue home dose of hydralazine 25 mg 3 times daily.   Acute metabolic encephalopathy Dementia Wife notes patient has been acutely altered.  Upon arrival to ED, he was fully alert and oriented.  CT scan of the head did not note any acute intercranial abnormality.  He is fully alert and oriented now as well.   Fall /generalized weakness/left leg weakness Acute on chronic.  Patient noted to have fallen while trying to get in bed and was noted to be significantly weak.  At baseline patient ambulates with use of a rolling walker.  Wife notes frequent falls at baseline.  Reported history of myasthenia gravis.  Admitting hospitalist had suspected possible myasthenia gravis exacerbation.  Patient was seen by neurology, he was not thought to have myasthenia gravis exacerbation.  Neurology signed off.  On my examination, patient appears to have equal strength in all extremities.  I think his presentation is more likely due to deconditioning and dehydration.  He has been seen by PT OT and they recommended SNF.  I discussed with the patient, he is agreeable to going to SNF except 2 facilities.  I have consulted TOC.  Pending placement.   CKD stage IV/AKI ruled out Upon extensive review, it appears that patient's creatinine is fluctuating and is ranging between 1.5-2.2.  Currently 2.1.  Back at his baseline.   Acute respiratory failure with hypoxia: Chest x-ray unremarkable.  Myasthenia gravis was thought to be suspected.  Patient complained of shortness of breath.  Chest x-ray and procalcitonin unremarkable.  I wonder if this is secondary to atelectasis.  Incentive spirometry is ordered but was not given to the patient.  Primary RN was advised to do that.  Patient is now  saturating 98% on room air.   Paroxysmal atrial fibrillation on chronic anticoagulation Asymptomatic and in sinus rhythm.  CHA2DS2-VASc score = >4. -Continue Eliquis   Hypokalemia: Resolved.  Chronic hypocalcemia Received some gluconate.  No indication for replacement at this point in time.   Prolonged QT interval QTc 558. -Correct electrolyte abnormalities -Avoid QT prolonging medications   Diastolic congestive heart failure = Last echocardiogram noted EF of 60-65% with grade 1 diastolic dysfunction on 12/2022.  Appears slightly dehydrated.  Gentle IV hydration, strict I's and O's.   Uncontrolled diabetes mellitus type 2 Last available hemoglobin A1c was 10.7 on 09/18/2022.  Currently on Semglee 30 units and SSI.  Blood sugar only slightly elevated.  Continue current regimen and reassess tomorrow morning.   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  Currently asymptomatic.  Follow-up outpatient with PCP.   GERD Continue PPI.   OSA Patient no longer on CPAP after was not covered by his insurance.  DVT prophylaxis: Eliquis  Code Status: Full Code  Family Communication:  None present at bedside.  Plan of care discussed with patient in length and he/she verbalized understanding and agreed with it.    Status is: Inpatient Remains inpatient appropriate because: Patient is now medically stable pending placement.   Estimated body mass index is 28.36 kg/m as calculated from the following:   Height as of this encounter: 5\' 6"  (1.676 m).   Weight as of this encounter: 79.7 kg.    Nutritional Assessment: Body mass index is 28.36 kg/m.[name] Seen by dietician.  I agree with the assessment and plan as outlined below: Nutrition Status:        . Skin Assessment: I have examined the patient's skin and I agree with the wound assessment as performed by the wound care RN as outlined below:    Consultants:  Neurology-signed off  Procedures:   None  Antimicrobials:  Anti-infectives (From admission, onward)    Start     Dose/Rate Route Frequency Ordered Stop   02/04/23 0400  cefTRIAXone (ROCEPHIN) 1 g in sodium chloride 0.9 % 100 mL IVPB        1 g 200 mL/hr over 30 Minutes Intravenous Every 24 hours 02/03/23 0756     02/03/23 0100  cefTRIAXone (ROCEPHIN) 1 g in sodium chloride 0.9 % 100 mL IVPB        1 g 200 mL/hr over 30 Minutes Intravenous  Once 02/03/23 0053 02/03/23 0227         Subjective: Patient seen and examined.  He has no complaints.  Objective: Vitals:   02/05/23 2334 02/06/23 0253 02/06/23 0845 02/06/23 1121  BP: 115/64 133/73 (!) 103/58 134/84  Pulse: 85 86 75 85  Resp: 20 20 18 18   Temp: 98.3 F (36.8 C) 98.3 F (36.8 C) 98.3 F (36.8 C) 98 F (36.7 C)  TempSrc: Oral Oral Oral Oral  SpO2: 97% 95% 92% 90%  Weight:  79.7 kg    Height:        Intake/Output Summary (Last 24 hours) at 02/06/2023 1130 Last data filed at 02/06/2023 0846 Gross per 24 hour  Intake 720 ml  Output 1450 ml  Net -730 ml    Filed Weights   02/04/23 0650 02/05/23 0415 02/06/23 0253  Weight: 81.6 kg 84 kg 79.7 kg    Examination:  General exam: Appears calm and comfortable  Respiratory system: Clear to auscultation. Respiratory effort normal. Cardiovascular system: S1 & S2 heard, RRR. No JVD, murmurs, rubs, gallops or clicks. No pedal edema. Gastrointestinal system: Abdomen is nondistended, soft and nontender. No organomegaly or masses felt. Normal bowel sounds heard. Central nervous system: Alert and oriented. No focal neurological deficits. Extremities: Symmetric 5 x 5 power. Skin: No rashes, lesions or ulcers.   Data Reviewed: I have personally reviewed following labs and imaging studies  CBC: Recent Labs  Lab 02/02/23 2245 02/02/23 2316 02/03/23 0613 02/04/23 0202 02/05/23 0142  WBC 12.9*  --  12.5* 9.9 10.3  NEUTROABS 9.5*  --   --   --  7.2  HGB 11.8* 12.2* 11.4* 11.5* 11.5*  HCT 36.5* 36.0*  35.9* 35.2* 36.2*  MCV 90.8  --  91.1 90.3 90.7  PLT 310  --  269 260 256    Basic Metabolic Panel: Recent Labs  Lab 02/02/23 2245 02/02/23 2316 02/03/23 0613 02/04/23 0202 02/05/23 0142  NA 143 144 143 135 135  K 3.7 3.6 3.1* 4.5 3.6  CL 107  --  109 104 107  CO2 25  --  25 25 20*  GLUCOSE 136*  --  96 245* 320*  BUN 33*  --  31* 28* 24*  CREATININE 2.25*  --  2.00* 2.09* 1.66*  CALCIUM 8.6*  --  8.2* 8.7* 8.4*    GFR: Estimated Creatinine Clearance: 36.4 mL/min (A) (by C-G formula based on SCr of 1.66 mg/dL (H)). Liver Function Tests: Recent Labs  Lab 02/02/23 2245  AST 32  ALT 24  ALKPHOS 122  BILITOT 0.7  PROT 5.3*  ALBUMIN 2.6*    No results for input(s): "LIPASE", "AMYLASE" in the last 168 hours. No results for input(s): "AMMONIA" in the last 168 hours. Coagulation Profile: No results for input(s): "INR", "PROTIME" in the last 168 hours. Cardiac Enzymes: Recent Labs  Lab 02/03/23 0618  CKTOTAL 52    BNP (last 3 results) No results for input(s): "PROBNP" in the last 8760 hours. HbA1C: Recent Labs    02/05/23 0142  HGBA1C 10.9*   CBG: Recent Labs  Lab 02/05/23 0800 02/05/23 1128 02/05/23 1629 02/05/23 2111 02/06/23 0611  GLUCAP 242* 212* 225* 119* 223*    Lipid Profile: No results for input(s): "CHOL", "HDL", "LDLCALC", "TRIG", "CHOLHDL", "LDLDIRECT" in the last 72 hours. Thyroid Function Tests: No results for input(s): "TSH", "T4TOTAL", "FREET4", "T3FREE", "THYROIDAB" in the last 72 hours. Anemia Panel: No results for input(s): "VITAMINB12", "FOLATE", "FERRITIN", "TIBC", "IRON", "RETICCTPCT" in the last 72 hours. Sepsis Labs: Recent Labs  Lab 02/02/23 2352 02/03/23 0156 02/03/23 0613 02/04/23 0202  PROCALCITON  --   --   --  <0.10  LATICACIDVEN 3.1* 2.5* 1.6  --      Recent Results (from the past 240 hour(s))  Urine Culture     Status: None   Collection Time: 02/03/23 12:53 AM   Specimen: Urine, Catheterized  Result Value  Ref Range Status   Specimen Description URINE, CATHETERIZED  Final   Special Requests NONE  Final   Culture   Final    NO GROWTH Performed at [facility], [address], [place], [name] [address]    Report Status 02/04/2023 FINAL  Final  Culture, blood (routine x 2)     Status: None (Preliminary result)   Collection Time: 02/03/23  1:57 AM   Specimen: BLOOD RIGHT HAND  Result Value Ref Range Status   Specimen Description BLOOD RIGHT HAND  Final   Special Requests   Final    BOTTLES DRAWN AEROBIC AND ANAEROBIC Blood Culture results may not be optimal due to an excessive volume of blood received in culture bottles   Culture   Final    NO GROWTH 3 DAYS Performed at [facility], [address], [place], [name] [address]    Report Status PENDING  Incomplete  Culture, blood (routine x 2)     Status: None (Preliminary result)   Collection Time: 02/03/23  1:57 AM   Specimen: BLOOD LEFT HAND  Result Value Ref Range Status   Specimen Description BLOOD LEFT HAND  Final   Special Requests   Final    BOTTLES DRAWN AEROBIC AND ANAEROBIC Blood Culture adequate volume   Culture   Final    NO GROWTH 3 DAYS Performed at [facility], [address] [place], [name] [address]    Report Status PENDING  Incomplete     Radiology Studies: No results found.  Scheduled Meds:  apixaban  5 mg Oral BID   atorvastatin  80 mg Oral QPM  buPROPion  150 mg Oral q morning   DULoxetine  30 mg Oral q morning   insulin aspart  0-15 Units Subcutaneous TID WC   insulin aspart  0-5 Units Subcutaneous QHS   insulin glargine-yfgn  30 Units Subcutaneous Daily   pantoprazole  40 mg Oral Daily   sodium chloride flush  3 mL Intravenous Q12H   tamsulosin  0.4 mg Oral QPC breakfast   Continuous Infusions:  cefTRIAXone (ROCEPHIN)  IV 1 g (02/06/23 0435)     LOS: 3 days   Hughie Closs, MD [facility]  02/06/2023, 11:30 AM   *Please note that this is a verbal dictation  therefore any spelling or grammatical errors are due to the "Dragon Medical One" system interpretation.  Please page via Amion and do not message via secure chat for urgent patient care matters. Secure chat can be used for non urgent patient care matters.  How to contact the [facility] Attending or Consulting provider 7A - 7P or covering provider during after hours 7P -7A, for this patient?  Check the care team in [facility] and look for a) attending/consulting TRH provider listed and b) the [facility] team listed. Page or secure chat 7A-7P. Log into [email or web address] and use [place]'s universal password to access. If you do not have the password, please contact the hospital operator. Locate the [facility] provider you are looking for under [facility] and page to a number that you can be directly reached. If you still have difficulty reaching the provider, please page the [facility] (Director on Call) for the Hospitalists listed on amion for assistance.

## 2023-02-06 NOTE — TOC Progression Note (Signed)
Transition of Care [facility]) - Progression Note    Patient Details  Name: LEDON MULDREW MRN: [id number] Date of Birth: 26-Feb-1946  [facility]) CM/SW Contact  Eduard Roux, [name] Phone Number: 02/06/2023, 11:41 AM  Clinical Narrative:     Pasrr#  [id number] E  from  02/05/23 -03/07/23    Expected Discharge Plan: Skilled Nursing Facility Barriers to Discharge: Insurance Authorization, SNF Pending bed offer  Expected Discharge Plan and Services In-house Referral: Clinical Social Work     Living arrangements for the past 2 months: Single Family Home                                       Social Determinants of Health (SDOH) Interventions SDOH Screenings   Food Insecurity: No Food Insecurity (02/03/2023)  Housing: Low Risk  (02/03/2023)  Transportation Needs: No Transportation Needs (02/03/2023)  Utilities: Not At Risk (02/03/2023)  Depression (PHQ2-9): Low Risk  (05/20/2019)  Tobacco Use: Low Risk  (02/02/2023)    Readmission Risk Interventions     No data to display

## 2023-02-06 NOTE — Progress Notes (Signed)
   Durable Medical Equipment (From admission, onward)        Start     Ordered  02/06/23 1619  For home use only DME Bedside commode  Once      Question:  Patient needs a bedside commode to treat with the following condition  Answer:  Physical deconditioning  02/06/23 1618   Pt Decreased awareness of safety, Decreased awareness of deficits and unable to make distance to bathroom safely

## 2023-02-06 NOTE — Inpatient Diabetes Management (Signed)
Inpatient Diabetes Program Recommendations  AACE/ADA: New Consensus Statement on Inpatient Glycemic Control (2015)  Target Ranges:  Prepandial:   less than 140 mg/dL      Peak postprandial:   less than 180 mg/dL (1-2 hours)      Critically ill patients:  140 - 180 mg/dL   Lab Results  Component Value Date   GLUCAP 192 (H) 02/06/2023   HGBA1C 10.9 (H) 02/05/2023    Review of Glycemic Control  Latest Reference Range & Units 02/05/23 08:00 02/05/23 11:28 02/05/23 16:29 02/05/23 21:11 02/06/23 06:11 02/06/23 11:18 02/06/23 11:53  Glucose-Capillary 70 - 99 mg/dL 161 (H) 096 (H) 045 (H) 119 (H) 223 (H) 194 (H) 192 (H)  (H): Data is abnormally high  Diabetes history: DM2 Outpatient Diabetes medications: Semglee 20 units QD, Humalog 0-14 units TID Current orders for Inpatient glycemic control: Semglee 30 units QD, Novolog 0-15 units TID  Inpatient Diabetes Program Recommendations:    Might consider:  Semglee 35 units QAM  Will continue to follow while inpatient.  Thank you, Dulce Sellar, MSN, CDCES Diabetes Coordinator Inpatient Diabetes Program [phone] (team pager from 8a-5p)

## 2023-02-06 NOTE — Progress Notes (Signed)
Physical Therapy Treatment Patient Details Name: Fernando Hess MRN: [id number] DOB: 05-Mar-1946 Today's Date: 02/06/2023   History of Present Illness 77 y.o. male adm 02/02/2023 after falling out of bed. Found to be hypotensive with AKI, Acute respiratory failure with hypoxia and Afib. 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    PT Comments    Pt pleasant and able to walk slightly increased distance with improved stability with rollator. Cues for transfers and posture with pt an increased fall risk and wife using AD and unable to provide significant assist at D/C. Plan is appropriate, encouraged mobility acutely and will continue to follow.     Recommendations for follow up therapy are one component of a multi-disciplinary discharge planning process, led by the attending physician.  Recommendations may be updated based on patient status, additional functional criteria and insurance authorization.  Follow Up Recommendations  Can patient physically be transported by private vehicle: Yes    Assistance Recommended at Discharge Intermittent Supervision/Assistance  Patient can return home with the following A little help with walking and/or transfers;A little help with bathing/dressing/bathroom;Assistance with cooking/housework;Assist for transportation;Help with stairs or ramp for entrance   Equipment Recommendations  None recommended by PT    Recommendations for Other Services       Precautions / Restrictions Precautions Precautions: Fall Restrictions Weight Bearing Restrictions: No     Mobility  Bed Mobility Overal bed mobility: Modified Independent Bed Mobility: Supine to Sit     Supine to sit: Supervision, HOB elevated     General bed mobility comments: HOB 40 degrees with use of rail, no physical assist, supervision for safety    Transfers Overall transfer level: Needs assistance    Transfers: Sit to/from Stand Sit to Stand: Min guard           General transfer comment: cues for hand placement and standing fully erect with pt able to rise from bed then perform 10 trials of STS from chair    Ambulation/Gait Ambulation/Gait assistance: Min guard Gait Distance (Feet): 120 Feet Assistive device: Rollator (4 wheels) Gait Pattern/deviations: Step-through pattern, Narrow base of support   Gait velocity interpretation: <1.8 ft/sec, indicate of risk for recurrent falls   General Gait Details: pt with improved stride, continued cues to look up and increase BOS with rollator, pt self-regulating distance   Stairs             Wheelchair Mobility    Modified Rankin (Stroke Patients Only)       Balance Overall balance assessment: Needs assistance Sitting-balance support: No upper extremity supported, Feet supported Sitting balance-Leahy Scale: Fair     Standing balance support: Bilateral upper extremity supported, Reliant on assistive device for balance Standing balance-Leahy Scale: Poor Standing balance comment: rollator in standing/gait                            Cognition Arousal/Alertness: Awake/alert Behavior During Therapy: WFL for tasks assessed/performed   Area of Impairment: Safety/judgement, Problem solving                         Safety/Judgement: Decreased awareness of safety, Decreased awareness of deficits   Problem Solving: Slow processing          Exercises General Exercises - Lower Extremity Long Arc Quad: AROM, Both, 20 reps, Seated, Strengthening    General Comments  Pertinent Vitals/Pain Pain Assessment Pain Assessment: No/denies pain    Home Living                          Prior Function            PT Goals (current goals can now be found in the care plan section) Progress towards PT goals: Progressing toward goals    Frequency    Min 1X/week      PT Plan  Current plan remains appropriate    Co-evaluation              AM-PAC PT "6 Clicks" Mobility   Outcome Measure  Help needed turning from your back to your side while in a flat bed without using bedrails?: None Help needed moving from lying on your back to sitting on the side of a flat bed without using bedrails?: A Little Help needed moving to and from a bed to a chair (including a wheelchair)?: A Little Help needed standing up from a chair using your arms (e.g., wheelchair or bedside chair)?: A Little Help needed to walk in hospital room?: A Little Help needed climbing 3-5 steps with a railing? : A Lot 6 Click Score: 18    End of Session Equipment Utilized During Treatment: Gait belt Activity Tolerance: Patient tolerated treatment well Patient left: in chair;with call bell/phone within reach;with chair alarm set Nurse Communication: Mobility status PT Visit Diagnosis: Other abnormalities of gait and mobility (R26.89);Muscle weakness (generalized) (M62.81);Other (comment)     Time: 6045-4098 PT Time Calculation (min) (ACUTE ONLY): 29 min  Charges:  $Gait Training: 8-22 mins $Therapeutic Activity: 8-22 mins                     Merryl Hacker, PT Acute Rehabilitation Services Office: [phone]    Fernando Hess 02/06/2023, 9:19 AM

## 2023-02-06 NOTE — TOC Progression Note (Signed)
Transition of Care (TOC) - Progression Note  Donn Pierini RN, BSN Transitions of Care Unit 4E- RN Case Manager See Treatment Team for direct phone #   Patient Details  Name: Fernando Hess MRN: [id number] Date of Birth: [birth year]  Transition of Care [facility]) CM/SW Contact  Zenda Alpers, Lenn Sink, RN Phone Number: 02/06/2023, 4:31 PM  Clinical Narrative:    Received msg from CSW- wife has declined SNF placement and now states she will take pt home w/ HH.  CM has been notified by [facility] that pt has referral from his Primary Care MD to them for HHPT- they were trying to open services prior to pt coming to hospital.   Call made to wife to discuss HH/DME needs. Per TC conversation- discussed HH with wife- explained PCP referral to [facility] as well as choice for [facility] services per CMS guidelines- referred wife to Medicare.gov website for star rating should she want to change agencies- wife agreeable to try [facility]- would like them to know there is an allergy to polyester in the home. Trudi Ida [facility] liaison aware.   Also discussed DME needs- wife would like w/c, BSC and nebulizer for home- requesting that the w/c have no polyester on it- explained to wife that CM may not be able to guarantee that- but would ask the DME providers that hospital typically works with to see if they know. Explained that in order to get w/c that has no polyester it may have to be special ordered- wife voiced understanding and is agreeable to have w/c shipped to home if it needs to be special ordered.  Calls made to [employer] and [employer] to check on fabric of w/c.   Per wife- pt will need EMS transport home- she is requesting discharge for Sunday so she has stair climber in place (pt has 8 steps to get up) and she would like pt to transport after lunch.   [facility] will follow for weekend discharge and start of care. HH order for HHPT needs to be placed.    Expected Discharge Plan: Skilled Nursing Facility Barriers to  Discharge: Equipment Delay (trying to locate w/c without polyester)  Expected Discharge Plan and Services In-house Referral: Clinical Social Work Discharge Planning Services: CM Consult Post Acute Care Choice: Durable Medical Equipment, Home Health Living arrangements for the past 2 months: Single Family Home                 DME Arranged: Bedside commode, Wheelchair manual, Nebulizer machine         HH Arranged: PT HH Agency: [facility] Date [facility] Agency Contacted: 02/06/23 Time HH Agency Contacted: 1631 Representative spoke with at [facility] Agency: Eber Jones   Social Determinants of Health (SDOH) Interventions SDOH Screenings   Food Insecurity: No Food Insecurity (02/03/2023)  Housing: Low Risk  (02/03/2023)  Transportation Needs: No Transportation Needs (02/03/2023)  Utilities: Not At Risk (02/03/2023)  Depression (PHQ2-9): Low Risk  (05/20/2019)  Tobacco Use: Low Risk  (02/02/2023)    Readmission Risk Interventions     No data to display

## 2023-02-07 DIAGNOSIS — R6510 Systemic inflammatory response syndrome (SIRS) of non-infectious origin without acute organ dysfunction: Secondary | ICD-10-CM | POA: Diagnosis not present

## 2023-02-07 LAB — BASIC METABOLIC PANEL
Anion gap: 8 (ref 5–15)
BUN: 24 mg/dL — ABNORMAL HIGH (ref 8–23)
CO2: 24 mmol/L (ref 22–32)
Calcium: 8.8 mg/dL — ABNORMAL LOW (ref 8.9–10.3)
Chloride: 106 mmol/L (ref 98–111)
Creatinine, Ser: 1.88 mg/dL — ABNORMAL HIGH (ref 0.61–1.24)
GFR, Estimated: 36 mL/min — ABNORMAL LOW (ref >60)
Glucose, Bld: 161 mg/dL — ABNORMAL HIGH (ref 70–99)
Potassium: 4.3 mmol/L (ref 3.5–5.1)
Sodium: 138 mmol/L (ref 135–145)

## 2023-02-07 LAB — GLUCOSE, CAPILLARY
Glucose-Capillary: 137 mg/dL — ABNORMAL HIGH (ref 70–99)
Glucose-Capillary: 154 mg/dL — ABNORMAL HIGH (ref 70–99)
Glucose-Capillary: 246 mg/dL — ABNORMAL HIGH (ref 70–99)
Glucose-Capillary: 226 mg/dL — ABNORMAL HIGH (ref 70–99)
Glucose-Capillary: 103 mg/dL — ABNORMAL HIGH (ref 70–99)

## 2023-02-07 LAB — CULTURE, BLOOD (ROUTINE X 2): Culture: NO GROWTH

## 2023-02-07 MED ORDER — SODIUM CHLORIDE 0.9 % IV SOLN: INTRAVENOUS | Status: AC

## 2023-02-07 NOTE — Progress Notes (Signed)
Patient performed NIF and VC with good effort  NIF -36  VC 1.3L

## 2023-02-07 NOTE — Progress Notes (Signed)
PROGRESS NOTE    Fernando Hess  ZOX:[id number] DOB: 1946-01-28 DOA: 02/02/2023 PCP: [name], MD   Brief Narrative:  HPI: Fernando Hess is a 77 y.o. male with medical history significant of paroxysmal atrial fibrillation, diastolic CHF, CADs/p CABG, diabetes mellitus type 2, CKD stage 3b, anxiety, reported history myasthenia gravis, recent right hip fracture, dementia, and OSA oxygen who presents after falling out of bed.  History is initially obtained from the patient who appears to be alert and oriented x 3.  He states that at baseline he lives with his wife at home and ambulates with use of a rolling walker and is not on oxygen at baseline.  He states that he had been in his normal state of health and was trying to get in bed last night when slipped off the mattress causing him to fall.  Denied any trauma to his head or loss of consciousness.  He denies having any recent fever, chills, nausea, vomiting, diarrhea, or dysuria.  Patient reports that his breathing is often affected from myasthenia gravis which she has had since the age of 77 and was diagnosed by Dr. [name] at [facility] in [place].      Wife notes that that he was not on oxygen at home, but had been placed on oxygen previously while at rest home. Patient previously had been on CPAP at night previously, but his machine broke about 3 years ago  and was not longer covered by insurance. He does not have the antibody in his blood like others with the condition of myasthenia gravis. They had previously tried on infusions twice last year.  She had found him yesterday evening on the side of the bed and thought that he likely slid down onto his bottom.  She reports that he fall also almost daily and she has to call EMS out in order to get him up.  Patient had fallen back in April fractured his right hip requiring right hip hemiarthroplasty. Yesterday patient seemed to be disoriented following the fall when she was asking  him questions which made her more concerned.  She felt like he could possibly be dehydrated.   EMS had reported the patient to be hypotensive and he had been given 700 mL of normal saline IV fluids prior to arrival.   In the emergency department patient was noted to be afebrile with respirations into the 40s, blood pressure as low as 96/57 with improvement up to 155/78 with IV fluids, and O2 saturations maintained on room air.  Initial pH did not show any signs of hypercapnia labs from 5/20 significant for WBC 12.9, BUN 33, creatinine 2.25, calcium 8.6, ionized calcium 1.13, albumin 2.6, and initial lactic acid 3.1.  Chest x-ray showed no active disease. CT scan of the head did not note any acute abnormality.  Blood cultures had been obtained.  Urinalysis was ordered.  PCCM had evaluated the patient due to questionable history of myasthenia gravis with tachypnea, low NIF, and weakness patient had been given 1 L of lactated Ringer's and  Rocephin IV.    Assessment & Plan:   Principal Problem:   SIRS (systemic inflammatory response syndrome) ([name]) Active Problems:   Transient hypotension   Acute metabolic encephalopathy   Dementia without behavioral disturbance ([name])   Generalized weakness   Fall at home, initial encounter   Acute kidney injury superimposed on chronic kidney disease ([name])   Acute respiratory failure with hypoxia ([name])   Diabetes mellitus, type 2 (  [name])   Myasthenia gravis ([name])   Hypokalemia   Prolonged QT interval  SIRS/dehydration Upon arrival to the emergency department patient was noted to be tachypneic with WBC elevated 12.5 and initial lactic acid 3.1.  Patient had been bolused IV fluids and started on empiric antibiotics Rocephin IV.  Lactic acid trended down to 2.5->1.6.  Based on his clinical picture, I do not think we are dealing with any infection here. UA and chest x-ray unremarkable, procalcitonin unremarkable.  I think his presentation was more likely secondary to  dehydration, antibiotics discontinued.  He appears to be well-hydrated and stable.   Transient hypotension Patient reported to be hypotensive prior to arrival to the hospital given 700 mL fluid bolus in route.  Blood pressure fairly stable, continue home dose of hydralazine 25 mg 3 times daily.   Acute metabolic encephalopathy Dementia Wife notes patient has been acutely altered.  Upon arrival to ED, he was fully alert and oriented.  CT scan of the head did not note any acute intercranial abnormality.  He is fully alert and oriented now as well.   Fall /generalized weakness/left leg weakness Acute on chronic.  Patient noted to have fallen while trying to get in bed and was noted to be significantly weak.  At baseline patient ambulates with use of a rolling walker.  Wife notes frequent falls at baseline.  Reported history of myasthenia gravis.  Admitting hospitalist had suspected possible myasthenia gravis exacerbation.  Patient was seen by neurology, he was not thought to have myasthenia gravis exacerbation.  Neurology signed off.  On my examination, patient appears to have equal strength in all extremities.  I think his presentation is more likely due to deconditioning and dehydration.  He has been seen by PT OT and they recommended SNF.  TOC was working with the family.  Wife was provided with the choices however I was informed on the evening of 02/06/2023 that wife is declining all the bed offers that she has and that she wants to take him home now.  When I spoke to the wife, she tells me that she has 2 reasons to decline, 1 is that the offers that he is getting are not from good facilities and that she has to pay out-of-pocket which she cannot afford so she has decided to take him home however, patient lives on the first floor and he has 7 steps to go, she has scheduled people to come install the left 02/07/2023 and she was requesting if patient can be discharged on Sunday, 02/08/2023.  I had a lengthy  discussion with her and tried to convince her to send the patient to SNF as he remains on very high risk of falling and he has had multiple admissions in the last 4 to 5 months.  She understands but still decided to take him home.   CKD stage IV/AKI ruled out Upon extensive review, it appears that patient's creatinine is fluctuating and is ranging between 1.5-2.2.  Currently 2.1.  Back at his baseline.   Acute respiratory failure with hypoxia: Chest x-ray unremarkable.  Myasthenia gravis was thought to be suspected.  Patient complained of shortness of breath.  Chest x-ray and procalcitonin unremarkable.  I wonder if this is secondary to atelectasis.  Incentive spirometry is ordered but was not given to the patient.  Primary RN was advised to do that.  Patient is now saturating 98% on room air.   Paroxysmal atrial fibrillation on chronic anticoagulation Asymptomatic and in sinus rhythm.  CHA2DS2-VASc score = >4. -Continue Eliquis   Hypokalemia: Resolved.  Chronic hypocalcemia Received some gluconate.  No indication for replacement at this point in time.   Prolonged QT interval QTc 558. -Correct electrolyte abnormalities -Avoid QT prolonging medications   Diastolic congestive heart failure = Last echocardiogram noted EF of 60-65% with grade 1 diastolic dysfunction on 12/2022.  Appears slightly dehydrated.  Gentle IV hydration, strict I's and O's.   Uncontrolled diabetes mellitus type 2 Last available hemoglobin A1c was 10.7 on 09/18/2022.  Currently on Semglee 30 units and SSI.  Blood sugar fairly controlled during the day but slightly elevated this morning.  Continue current regimen and reassess tomorrow morning.   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  Currently asymptomatic.  Follow-up outpatient with PCP.   GERD Continue PPI.   OSA Patient no longer on CPAP after was not covered by his insurance.  DVT prophylaxis: Eliquis   Code Status: Full Code   Family Communication:  None present at bedside.  Plan of care discussed with his wife on the afternoon of 02/07/2023.  Status is: Inpatient Remains inpatient appropriate because: Patient is now medically stable but wife is preparing for him to come home and is requesting discharge on Sunday/tomorrow.   Estimated body mass index is 28.89 kg/m as calculated from the following:   Height as of this encounter: 5\' 6"  (1.676 m).   Weight as of this encounter: 81.2 kg.    Nutritional Assessment: Body mass index is 28.89 kg/m.[name] Seen by dietician.  I agree with the assessment and plan as outlined below: Nutrition Status:        . Skin Assessment: I have examined the patient's skin and I agree with the wound assessment as performed by the wound care RN as outlined below:    Consultants:  Neurology-signed off  Procedures:  None  Antimicrobials:  Anti-infectives (From admission, onward)    Start     Dose/Rate Route Frequency Ordered Stop   02/04/23 0400  cefTRIAXone (ROCEPHIN) 1 g in sodium chloride 0.9 % 100 mL IVPB  Status:  Discontinued        1 g 200 mL/hr over 30 Minutes Intravenous Every 24 hours 02/03/23 0756 02/06/23 1131   02/03/23 0100  cefTRIAXone (ROCEPHIN) 1 g in sodium chloride 0.9 % 100 mL IVPB        1 g 200 mL/hr over 30 Minutes Intravenous  Once 02/03/23 0053 02/03/23 0227         Subjective: Patient seen and examined.  No complaints.  Objective: Vitals:   02/07/23 0720 02/07/23 0800 02/07/23 0806 02/07/23 1101  BP: (!) 137/55   (!) 114/97  Pulse: 95 92  95  Resp: 20 12  (!) 21  Temp: 98.6 F (37 C)   98.5 F (36.9 C)  TempSrc: Oral   Oral  SpO2: 98%   100%  Weight:   81.2 kg   Height:        Intake/Output Summary (Last 24 hours) at 02/07/2023 1143 Last data filed at 02/07/2023 1324 Gross per 24 hour  Intake 959 ml  Output 1350 ml  Net -391 ml    Filed Weights   02/06/23 0253 02/07/23 0005 02/07/23 0806  Weight: 79.7 kg 81.6 kg 81.2 kg     Examination:  General exam: Appears calm and comfortable  Respiratory system: Clear to auscultation. Respiratory effort normal. Cardiovascular system: S1 & S2 heard, RRR. No JVD, murmurs, rubs, gallops or clicks.  No pedal edema. Gastrointestinal system: Abdomen is nondistended, soft and nontender. No organomegaly or masses felt. Normal bowel sounds heard. Central nervous system: Alert and oriented. No focal neurological deficits. Extremities: Symmetric 5 x 5 power. Skin: No rashes, lesions or ulcers.   Data Reviewed: I have personally reviewed following labs and imaging studies  CBC: Recent Labs  Lab 02/02/23 2245 02/02/23 2316 02/03/23 0613 02/04/23 0202 02/05/23 0142  WBC 12.9*  --  12.5* 9.9 10.3  NEUTROABS 9.5*  --   --   --  7.2  HGB 11.8* 12.2* 11.4* 11.5* 11.5*  HCT 36.5* 36.0* 35.9* 35.2* 36.2*  MCV 90.8  --  91.1 90.3 90.7  PLT 310  --  269 260 256    Basic Metabolic Panel: Recent Labs  Lab 02/02/23 2245 02/02/23 2316 02/03/23 0613 02/04/23 0202 02/05/23 0142 02/07/23 0109  NA 143 144 143 135 135 138  K 3.7 3.6 3.1* 4.5 3.6 4.3  CL 107  --  109 104 107 106  CO2 25  --  25 25 20* 24  GLUCOSE 136*  --  96 245* 320* 161*  BUN 33*  --  31* 28* 24* 24*  CREATININE 2.25*  --  2.00* 2.09* 1.66* 1.88*  CALCIUM 8.6*  --  8.2* 8.7* 8.4* 8.8*    GFR: Estimated Creatinine Clearance: 32.4 mL/min (A) (by C-G formula based on SCr of 1.88 mg/dL (H)). Liver Function Tests: Recent Labs  Lab 02/02/23 2245  AST 32  ALT 24  ALKPHOS 122  BILITOT 0.7  PROT 5.3*  ALBUMIN 2.6*    No results for input(s): "LIPASE", "AMYLASE" in the last 168 hours. No results for input(s): "AMMONIA" in the last 168 hours. Coagulation Profile: No results for input(s): "INR", "PROTIME" in the last 168 hours. Cardiac Enzymes: Recent Labs  Lab 02/03/23 0618  CKTOTAL 52    BNP (last 3 results) No results for input(s): "PROBNP" in the last 8760 hours. HbA1C: Recent Labs     02/05/23 0142  HGBA1C 10.9*    CBG: Recent Labs  Lab 02/06/23 1605 02/06/23 2113 02/07/23 0617 02/07/23 0755 02/07/23 1130  GLUCAP 207* 104* 137* 154* 246*    Lipid Profile: No results for input(s): "CHOL", "HDL", "LDLCALC", "TRIG", "CHOLHDL", "LDLDIRECT" in the last 72 hours. Thyroid Function Tests: No results for input(s): "TSH", "T4TOTAL", "FREET4", "T3FREE", "THYROIDAB" in the last 72 hours. Anemia Panel: No results for input(s): "VITAMINB12", "FOLATE", "FERRITIN", "TIBC", "IRON", "RETICCTPCT" in the last 72 hours. Sepsis Labs: Recent Labs  Lab 02/02/23 2352 02/03/23 0156 02/03/23 0613 02/04/23 0202  PROCALCITON  --   --   --  <0.10  LATICACIDVEN 3.1* 2.5* 1.6  --      Recent Results (from the past 240 hour(s))  Urine Culture     Status: None   Collection Time: 02/03/23 12:53 AM   Specimen: Urine, Catheterized  Result Value Ref Range Status   Specimen Description URINE, CATHETERIZED  Final   Special Requests NONE  Final   Culture   Final    NO GROWTH Performed at [facility], [address], [place], [name] [address]    Report Status 02/04/2023 FINAL  Final  Culture, blood (routine x 2)     Status: None (Preliminary result)   Collection Time: 02/03/23  1:57 AM   Specimen: BLOOD RIGHT HAND  Result Value Ref Range Status   Specimen Description BLOOD RIGHT HAND  Final   Special Requests   Final    BOTTLES  DRAWN AEROBIC AND ANAEROBIC Blood Culture results may not be optimal due to an excessive volume of blood received in culture bottles   Culture   Final    NO GROWTH 4 DAYS Performed at [facility], [address] [place], [name] [address]    Report Status PENDING  Incomplete  Culture, blood (routine x 2)     Status: None (Preliminary result)   Collection Time: 02/03/23  1:57 AM   Specimen: BLOOD LEFT HAND  Result Value Ref Range Status   Specimen Description BLOOD LEFT HAND  Final   Special Requests   Final    BOTTLES DRAWN AEROBIC AND  ANAEROBIC Blood Culture adequate volume   Culture   Final    NO GROWTH 4 DAYS Performed at [facility], [address], [place], [name] [address]    Report Status PENDING  Incomplete     Radiology Studies: No results found.  Scheduled Meds:  apixaban  5 mg Oral BID   atorvastatin  80 mg Oral QPM   buPROPion  150 mg Oral q morning   DULoxetine  30 mg Oral q morning   insulin aspart  0-15 Units Subcutaneous TID WC   insulin aspart  0-5 Units Subcutaneous QHS   insulin glargine-yfgn  30 Units Subcutaneous Daily   pantoprazole  40 mg Oral Daily   sodium chloride flush  3 mL Intravenous Q12H   tamsulosin  0.4 mg Oral QPC breakfast   Continuous Infusions:     LOS: 4 days   Hughie Closs, MD [facility]  02/07/2023, 11:43 AM   *Please note that this is a verbal dictation therefore any spelling or grammatical errors are due to the "Dragon Medical One" system interpretation.  Please page via Amion and do not message via secure chat for urgent patient care matters. Secure chat can be used for non urgent patient care matters.  How to contact the [facility] Attending or Consulting provider 7A - 7P or covering provider during after hours 7P -7A, for this patient?  Check the care team in [facility] and look for a) attending/consulting TRH provider listed and b) the [facility] team listed. Page or secure chat 7A-7P. Log into [email or web address] and use [place]'s universal password to access. If you do not have the password, please contact the hospital operator. Locate the [facility] provider you are looking for under [facility] and page to a number that you can be directly reached. If you still have difficulty reaching the provider, please page the [facility] (Director on Call) for the Hospitalists listed on amion for assistance.

## 2023-02-07 NOTE — Progress Notes (Signed)
PT ambulated with walker and assistance to his bedroom door and back to bed last night. SBP was 176 this morning. Pt was given hydralazine 10mg  IVP. Repeat SBP=119.

## 2023-02-07 NOTE — Progress Notes (Signed)
NIF -20  VC 1.6L   Pt performed with great effort.

## 2023-02-08 ENCOUNTER — Other Ambulatory Visit: Payer: Self-pay

## 2023-02-08 ENCOUNTER — Emergency Department (HOSPITAL_COMMUNITY): Payer: Medicare Other

## 2023-02-08 ENCOUNTER — Emergency Department (HOSPITAL_COMMUNITY)
Admission: EM | Admit: 2023-02-08 | Discharge: 2023-02-08 | Disposition: A | Discharge status: Home / Self Care | Payer: Medicare Other | Attending: Emergency Medicine | Admitting: Emergency Medicine

## 2023-02-08 ENCOUNTER — Encounter (HOSPITAL_COMMUNITY): Payer: Self-pay

## 2023-02-08 DIAGNOSIS — M25511 Pain in right shoulder: Secondary | ICD-10-CM | POA: Diagnosis present

## 2023-02-08 DIAGNOSIS — E1065 Type 1 diabetes mellitus with hyperglycemia: Secondary | ICD-10-CM | POA: Diagnosis not present

## 2023-02-08 DIAGNOSIS — I2510 Atherosclerotic heart disease of native coronary artery without angina pectoris: Secondary | ICD-10-CM | POA: Insufficient documentation

## 2023-02-08 DIAGNOSIS — S7001XA Contusion of right hip, initial encounter: Secondary | ICD-10-CM | POA: Insufficient documentation

## 2023-02-08 DIAGNOSIS — I509 Heart failure, unspecified: Secondary | ICD-10-CM | POA: Insufficient documentation

## 2023-02-08 DIAGNOSIS — F0390 Unspecified dementia without behavioral disturbance: Secondary | ICD-10-CM | POA: Insufficient documentation

## 2023-02-08 DIAGNOSIS — R6510 Systemic inflammatory response syndrome (SIRS) of non-infectious origin without acute organ dysfunction: Secondary | ICD-10-CM | POA: Diagnosis not present

## 2023-02-08 DIAGNOSIS — Z951 Presence of aortocoronary bypass graft: Secondary | ICD-10-CM | POA: Insufficient documentation

## 2023-02-08 DIAGNOSIS — W19XXXA Unspecified fall, initial encounter: Secondary | ICD-10-CM | POA: Diagnosis not present

## 2023-02-08 DIAGNOSIS — Z794 Long term (current) use of insulin: Secondary | ICD-10-CM | POA: Diagnosis not present

## 2023-02-08 DIAGNOSIS — Z7901 Long term (current) use of anticoagulants: Secondary | ICD-10-CM | POA: Diagnosis not present

## 2023-02-08 DIAGNOSIS — S40011A Contusion of right shoulder, initial encounter: Secondary | ICD-10-CM | POA: Insufficient documentation

## 2023-02-08 LAB — CBC WITH DIFFERENTIAL/PLATELET
Abs Immature Granulocytes: 0.06 10*3/uL (ref 0.00–0.07)
Basophils Absolute: 0.1 10*3/uL (ref 0.0–0.1)
Basophils Relative: 1 %
Eosinophils Absolute: 0.3 10*3/uL (ref 0.0–0.5)
Eosinophils Relative: 2 %
HCT: 35.4 % — ABNORMAL LOW (ref 39.0–52.0)
Hemoglobin: 11.2 g/dL — ABNORMAL LOW (ref 13.0–17.0)
Immature Granulocytes: 1 %
Lymphocytes Relative: 20 %
Lymphs Abs: 2.1 10*3/uL (ref 0.7–4.0)
MCH: 29.1 pg (ref 26.0–34.0)
MCHC: 31.6 g/dL (ref 30.0–36.0)
MCV: 91.9 fL (ref 80.0–100.0)
Monocytes Absolute: 1 10*3/uL (ref 0.1–1.0)
Monocytes Relative: 10 %
Neutro Abs: 7.2 10*3/uL (ref 1.7–7.7)
Neutrophils Relative %: 66 %
Platelets: 244 10*3/uL (ref 150–400)
RBC: 3.85 MIL/uL — ABNORMAL LOW (ref 4.22–5.81)
RDW: 16.4 % — ABNORMAL HIGH (ref 11.5–15.5)
WBC: 10.7 10*3/uL — ABNORMAL HIGH (ref 4.0–10.5)
nRBC: 0 % (ref 0.0–0.2)

## 2023-02-08 LAB — CULTURE, BLOOD (ROUTINE X 2): Culture: NO GROWTH

## 2023-02-08 LAB — BASIC METABOLIC PANEL
Anion gap: 6 (ref 5–15)
Anion gap: 12 (ref 5–15)
BUN: 26 mg/dL — ABNORMAL HIGH (ref 8–23)
BUN: 30 mg/dL — ABNORMAL HIGH (ref 8–23)
CO2: 24 mmol/L (ref 22–32)
CO2: 21 mmol/L — ABNORMAL LOW (ref 22–32)
Calcium: 8.5 mg/dL — ABNORMAL LOW (ref 8.9–10.3)
Calcium: 8.4 mg/dL — ABNORMAL LOW (ref 8.9–10.3)
Chloride: 106 mmol/L (ref 98–111)
Chloride: 106 mmol/L (ref 98–111)
Creatinine, Ser: 1.8 mg/dL — ABNORMAL HIGH (ref 0.61–1.24)
Creatinine, Ser: 2.19 mg/dL — ABNORMAL HIGH (ref 0.61–1.24)
GFR, Estimated: 38 mL/min — ABNORMAL LOW (ref >60)
GFR, Estimated: 30 mL/min — ABNORMAL LOW (ref >60)
Glucose, Bld: 174 mg/dL — ABNORMAL HIGH (ref 70–99)
Glucose, Bld: 168 mg/dL — ABNORMAL HIGH (ref 70–99)
Potassium: 4.4 mmol/L (ref 3.5–5.1)
Potassium: 4.1 mmol/L (ref 3.5–5.1)
Sodium: 136 mmol/L (ref 135–145)
Sodium: 139 mmol/L (ref 135–145)

## 2023-02-08 LAB — GLUCOSE, CAPILLARY
Glucose-Capillary: 180 mg/dL — ABNORMAL HIGH (ref 70–99)
Glucose-Capillary: 194 mg/dL — ABNORMAL HIGH (ref 70–99)

## 2023-02-08 MED ORDER — INSULIN GLARGINE-YFGN 100 UNIT/ML ~~LOC~~ SOLN: 30.0000 [IU] | Freq: Every day | SUBCUTANEOUS | 0 refills | Status: DC

## 2023-02-08 NOTE — Progress Notes (Signed)
[facility] here to transport patient home. Wife, Carney Bern, notified by this nurse of patient's departure from unit.

## 2023-02-08 NOTE — ED Triage Notes (Signed)
EMS reports pt just came home from hospital stay that was d/t fall; pt fell going to restroom.  Reports pain in right hip, right shoulder & arm.  C-collar in place was cleared by EDP at bedside after assessment. Pt A&O x4

## 2023-02-08 NOTE — ED Notes (Signed)
[facility] called for transport.  

## 2023-02-08 NOTE — Discharge Instructions (Signed)
Thankfully your x-rays do not show any signs of broken bones, all of your testing here has been reassuring, you just had a very long admission to the hospital which was reassuring as well.  You can follow-up with your family doctor, please take special care when trying to walk around the house to make sure that you are not tripping or falling or losing her balance.

## 2023-02-08 NOTE — Discharge Summary (Signed)
Physician Discharge Summary  Fernando Hess UJW:[id number] DOB: 1946-06-16 DOA: 02/02/2023  PCP: [name], MD  Admit date: 02/02/2023 Discharge date: 02/08/2023 30 Day Unplanned Readmission Risk Score    Flowsheet Row ED to Hosp-Admission (Current) from 02/02/2023 in [facility] 4E CV SURGICAL PROGRESSIVE CARE  30 Day Unplanned Readmission Risk Score (%) 44.42 Filed at 02/08/2023 0801       This score is the patient's risk of an unplanned readmission within 30 days of being discharged (0 -100%). The score is based on dignosis, age, lab data, medications, orders, and past utilization.   Low:  0-14.9   Medium: 15-21.9   High: 22-29.9   Extreme: 30 and above          Admitted From: Home Disposition: Home  Recommendations for Outpatient Follow-up:  Follow up with PCP in 1-2 weeks Please obtain BMP/CBC in one week Please follow up with your PCP on the following pending results: Unresulted Labs (From admission, onward)    None         Home Health: Yes Equipment/Devices: Multiple  Discharge Condition: Stable CODE STATUS: Full code Diet recommendation: Cardiac  Subjective: Patient seen and examined.  He has no complaints.  He is aware of the discharge plan today.  Brief/Interim Summary: Fernando Hess is a 77 y.o. male with medical history significant of paroxysmal atrial fibrillation, diastolic CHF, CADs/p CABG, diabetes mellitus type 2, CKD stage 3b, anxiety, reported history myasthenia gravis, recent right hip fracture, dementia, and OSA oxygen who presented after falling out of bed.  at baseline he lives with his wife at home and ambulates with use of a rolling walker and is not on oxygen at baseline.  He states that he had been in his normal state of health and was trying to get in bed last night when slipped off the mattress causing him to fall.  Denied any trauma to his head or loss of consciousness. Patient reports that his breathing is often affected from myasthenia  gravis which she has had since the age of 45 and was diagnosed by Dr. [name] at [facility] in [place].      Wife notes that that he was not on oxygen at home, but had been placed on oxygen previously while at rest home. Patient previously had been on CPAP at night, but his machine broke about 3 years ago  and was not longer covered by insurance.  She reports that he falls also almost daily and she has to call EMS out in order to get him up.  Patient had fallen back in April fractured his right hip requiring right hip hemiarthroplasty. EMS had reported the patient to be hypotensive and he had been given 700 mL of normal saline IV fluids prior to arrival.   In the emergency department patient was noted to be afebrile with respirations into the 40s, blood pressure as low as 96/57 with improvement up to 155/78 with IV fluids, and O2 saturations maintained on room air.  Initial pH did not show any signs of hypercapnia labs from 5/20 significant for WBC 12.9, BUN 33, creatinine 2.25, calcium 8.6, ionized calcium 1.13, albumin 2.6, and initial lactic acid 3.1.  Chest x-ray showed no active disease. CT scan of the head did not note any acute abnormality.  Blood cultures had been obtained.  Urinalysis was ordered.  PCCM had evaluated the patient due to questionable history of myasthenia gravis with tachypnea, low NIF, and weakness patient had been given 1 L  of lactated Ringer's and  Rocephin IV.  Details of hospitalization as below.   SIRS/dehydration Upon arrival to the emergency department patient was noted to be tachypneic with WBC elevated 12.5 and initial lactic acid 3.1.  Patient had been bolused IV fluids and started on empiric antibiotics Rocephin IV.  Lactic acid trended down to 2.5->1.6.  Based on his clinical picture, I do not think we are dealing with any infection here. UA and chest x-ray unremarkable, procalcitonin unremarkable.  I think his presentation was more likely secondary to  dehydration, antibiotics discontinued and patient remained stable.    Transient hypotension Patient reported to be hypotensive prior to arrival to the hospital given 700 mL fluid bolus in route.  Blood pressure fairly stable, resume home medications.   Acute metabolic encephalopathy Dementia Wife notes patient has been acutely altered.  Upon arrival to ED, he was fully alert and oriented.  CT scan of the head did not note any acute intercranial abnormality.  He is fully alert and oriented now as well.   Fall /generalized weakness/left leg weakness  Wife notes frequent falls at baseline.  Reported history of myasthenia gravis.  Admitting hospitalist had suspected possible myasthenia gravis exacerbation.  Patient was seen by neurology, he was not thought to have myasthenia gravis exacerbation.  Neurology signed off.  On my examination, patient appears to have equal strength in all extremities.  I think his presentation is more likely due to deconditioning and dehydration.  He has been seen by PT OT and they recommended SNF.  TOC was working with the family.  Wife was provided with the choices however I was informed on the evening of 02/06/2023 that wife is declining all the bed offers that she has and that she wants to take him home now.  When I spoke to the wife, she tells me that she has 2 reasons to decline, 1 is that the offers that he is getting are not from good facilities and that she has to pay out-of-pocket which she cannot afford so she has decided to take him home however, patient lives on the first floor and he has 7 steps to go, the wife arranged left to be installed for those steps yesterday and requested discharge on Sunday which is today.  Patient has remained stable so we are discharging him home per wife's request.  Although I had very lengthy discussion with the wife trying to convince her that the best place for the patient would be to go to SNF since he has been falling frequently and he  will be at high risk of more falls and can lead to dangerous injuries and rehospitalization but wife decided to take him home anyways.    CKD stage IV/AKI ruled out Upon extensive review, it appears that patient's creatinine is fluctuating and is ranging between 1.5-2.2.  Currently 2.1.  Back at his baseline.   Acute respiratory failure with hypoxia: Chest x-ray unremarkable.  Myasthenia gravis was thought to be suspected.  Patient complained of shortness of breath.  Chest x-ray and procalcitonin unremarkable.  I wonder if this is secondary to atelectasis.  Incentive spirometry is ordered but was not given to the patient.  Primary RN was advised to do that.  Patient is now saturating 98% on room air.   Paroxysmal atrial fibrillation on chronic anticoagulation Asymptomatic and in sinus rhythm.  CHA2DS2-VASc score = >4. -Continue Eliquis   Hypokalemia: Resolved.   Chronic hypocalcemia Received some gluconate.  No indication for replacement  at this point in time.   Prolonged QT interval QTc 558. -Correct electrolyte abnormalities -Avoid QT prolonging medications   Diastolic congestive heart failure = Last echocardiogram noted EF of 60-65% with grade 1 diastolic dysfunction on 12/2022.  Appears slightly dehydrated.  Gentle IV hydration, strict I's and O's.   Uncontrolled diabetes mellitus type 2 Last available hemoglobin A1c was 10.7 on 09/18/2022.  He was on 20 units of Lantus.  Currently on Semglee 30 units and SSI.  Blood sugar fairly controlled and with his significantly elevated hemoglobin A1c, it is clear that his diabetes is not well-controlled at current dosage so I am discharging him on 30 units of Lantus.   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  Currently asymptomatic.  Follow-up outpatient with PCP.   GERD Continue PPI.   OSA Patient no longer on CPAP after was not covered by his insurance.  Discharge plan was discussed with patient and/or family  member and they verbalized understanding and agreed with it.  Discharge Diagnoses:  Principal Problem:   SIRS (systemic inflammatory response syndrome) ([name]) Active Problems:   Transient hypotension   Acute metabolic encephalopathy   Dementia without behavioral disturbance ([name])   Generalized weakness   Fall at home, initial encounter   Acute kidney injury superimposed on chronic kidney disease ([name])   Acute respiratory failure with hypoxia ([name])   Diabetes mellitus, type 2 ([name])   Myasthenia gravis ([name])   Hypokalemia   Prolonged QT interval    Discharge Instructions  Discharge Instructions     Ambulatory referral to Neurology   Complete by: As directed    An appointment is requested in approximately: 2-4 wks for second opinion re: possible myasthenia gravis      Allergies as of 02/08/2023       Reactions   Cilostazol Swelling, Other (See Comments)   Edema   Dulaglutide Nausea And Vomiting, Other (See Comments)   TRULICITY   Levofloxacin Hives, Itching, Rash   Liraglutide Other (See Comments)   Severe fatigue & insomnia   Lisinopril Itching, Rash, Cough        Medication List     TAKE these medications    apixaban 5 MG Tabs tablet Commonly known as: ELIQUIS Take 1 tablet (5 mg total) by mouth 2 (two) times daily.   atorvastatin 80 MG tablet Commonly known as: LIPITOR TAKE ONE TABLET BY MOUTH EVERY EVENING What changed: when to take this   B-D UF III MINI PEN NEEDLES 31G X 5 MM Misc Generic drug: Insulin Pen Needle USE AS DIRECTED WITH LANTUS SOLOSTAR   budesonide 0.25 MG/2ML nebulizer solution Commonly known as: PULMICORT Take 2 mLs (0.25 mg total) by nebulization 2 (two) times daily.   buPROPion 150 MG 24 hr tablet Commonly known as: WELLBUTRIN XL Take 150 mg by mouth every morning.   DULoxetine 30 MG capsule Commonly known as: CYMBALTA Take 1 capsule (30 mg total) by mouth every morning.   feeding supplement (GLUCERNA SHAKE) Liqd Take 237  mLs by mouth 3 (three) times daily between meals.   FreeStyle Libre 2 Sensor Misc Inject 1 Device into the skin every 14 (fourteen) days.   hydrALAZINE 25 MG tablet Commonly known as: APRESOLINE Take 1 tablet (25 mg total) by mouth every 6 (six) hours as needed (SBP>160 or DBP>110).   insulin glargine-yfgn 100 UNIT/ML injection Commonly known as: SEMGLEE Inject 0.3 mLs (30 Units total) into the skin daily. What changed: how much to  take   insulin lispro 100 UNIT/ML injection Commonly known as: HUMALOG Inject 0-14 Units into the skin in the morning, at noon, in the evening, and at bedtime. 201-250= 2 units 251-300= 4 units, 301-350= 6 units, 351-400= 8 units, 401-450 = 10 units, 451-500= 12 units. If >500 or high, give 14 units.   MAGNESIUM PO Take 400 mg by mouth daily.   nitroGLYCERIN 0.4 MG SL tablet Commonly known as: NITROSTAT Place 0.4 mg under the tongue every 5 (five) minutes as needed for chest pain.   nystatin powder Commonly known as: MYCOSTATIN/NYSTOP Apply 1 Application topically 2 (two) times daily as needed (skin irritation). Apply to the groin in the morning and evening   OXYGEN Inhale 2 L into the lungs See admin instructions. Inhale 2 L every shift for shortness of breath   pantoprazole 40 MG tablet Commonly known as: PROTONIX TAKE 1 TABLET BY MOUTH EVERY DAY What changed: when to take this   polyethylene glycol 17 g packet Commonly known as: MIRALAX / GLYCOLAX Take 17 g by mouth 2 (two) times daily as needed for mild constipation.   SENIOR MULTIVITAMIN PLUS PO Take 1 tablet by mouth daily with breakfast.   senna-docusate 8.6-50 MG tablet Commonly known as: Senokot-S Take 1 tablet by mouth 2 (two) times daily as needed for moderate constipation.   tamsulosin 0.4 MG Caps capsule Commonly known as: FLOMAX Take 1 capsule (0.4 mg total) by mouth daily after breakfast. What changed: when to take this               Durable Medical Equipment   (From admission, onward)           Start     Ordered   02/06/23 1752  For home use only DME Bedside commode  Once       Question:  Patient needs a bedside commode to treat with the following condition  Answer:  Balance problem   02/06/23 1751   02/06/23 1619  For home use only DME Bedside commode  Once       Question:  Patient needs a bedside commode to treat with the following condition  Answer:  Physical deconditioning   02/06/23 1618   02/06/23 1513  For home use only DME lightweight manual wheelchair with seat cushion  Once       Comments: Patient suffers from frequent falls/weakness which impairs their ability to perform daily activities like dressing, feeding, and grooming in the home.  A walker will not resolve  issue with performing activities of daily living. A wheelchair will allow patient to safely perform daily activities. Patient is not able to propel themselves in the home using a standard weight wheelchair due to general weakness. Patient can self propel in the lightweight wheelchair. Length of need Lifetime. Accessories: elevating leg rests (ELRs), wheel locks, extensions and anti-tippers.   02/06/23 1513            Follow-up Information     Home, Medi Follow up.   Why: ([phone]) HHPT arranged via your PCP- they will contact you to schedule Contact information: [address] [name] [address] [phone]         [name], MD Follow up in 1 week(s).   Specialty: Family Medicine Contact information: [address] [name] [address] [phone]                Allergies  Allergen Reactions   Cilostazol Swelling and Other (See Comments)  Edema    Dulaglutide Nausea And Vomiting and Other (See Comments)    TRULICITY   Levofloxacin Hives, Itching and Rash   Liraglutide Other (See Comments)    Severe fatigue & insomnia   Lisinopril Itching, Rash and Cough    Consultations: Neurology   Procedures/Studies: CT  Head Wo Contrast  Result Date: 02/02/2023 CLINICAL DATA:  Head trauma, minor (Age >= 65y) EXAM: CT HEAD WITHOUT CONTRAST TECHNIQUE: Contiguous axial images were obtained from the base of the skull through the vertex without intravenous contrast. RADIATION DOSE REDUCTION: This exam was performed according to the departmental dose-optimization program which includes automated exposure control, adjustment of the mA and/or kV according to patient size and/or use of iterative reconstruction technique. COMPARISON:  01/07/2023 FINDINGS: Brain: Normal anatomic configuration. Parenchymal volume loss is commensurate with the patient's age. Mild periventricular white matter changes are present likely reflecting the sequela of small vessel ischemia. 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 No abnormal intra or extra-axial mass lesion or fluid collection. No abnormal mass effect or midline shift. No evidence of acute intracranial hemorrhage or infarct. Ventricular size is normal. Cerebellum unremarkable. Vascular: No asymmetric hyperdense vasculature at the skull base. Skull: Intact Sinuses/Orbits: Paranasal sinuses are clear. Orbits are unremarkable. Other: Mastoid air cells and middle ear cavities are clear. IMPRESSION: 1. No acute intracranial abnormality. No calvarial fracture. 2. Mild senescent change. 3. 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 Electronically Signed   By: Helyn Numbers M.D.   On: 02/02/2023 23:08   DG Chest Port 1 View  Result Date: 02/02/2023 CLINICAL DATA:  Dyspnea EXAM: PORTABLE CHEST 1 VIEW COMPARISON:  01/08/2023 FINDINGS: Lungs are clear. No pneumothorax or pleural effusion. Coronary artery bypass grafting has been performed. Cardiac size within normal limits. Pulmonary vascularity is normal. No acute bone abnormality. IMPRESSION: No active disease. Electronically Signed   By: Helyn Numbers M.D.   On: 02/02/2023 23:05     Discharge Exam: Vitals:    02/07/23 2300 02/08/23 0805  BP: 109/70 117/60  Pulse: 84 96  Resp: 16 20  Temp: 98.3 F (36.8 C) 98 F (36.7 C)  SpO2:  98%   Vitals:   02/07/23 1925 02/07/23 2300 02/08/23 0609 02/08/23 0805  BP: 132/75 109/70  117/60  Pulse: 80 84  96  Resp: 16 16  20   Temp: 97.8 F (36.6 C) 98.3 F (36.8 C)  98 F (36.7 C)  TempSrc: Oral Oral  Oral  SpO2: 98%   98%  Weight:   81.6 kg   Height:        General: Pt is alert, awake, not in acute distress Cardiovascular: RRR, S1/S2 +, no rubs, no gallops Respiratory: CTA bilaterally, no wheezing, no rhonchi Abdominal: Soft, NT, ND, bowel sounds + Extremities: no edema, no cyanosis    The results of significant diagnostics from this hospitalization (including imaging, microbiology, ancillary and laboratory) are listed below for reference.     Microbiology: Recent Results (from the past 240 hour(s))  Urine Culture     Status: None   Collection Time: 02/03/23 12:53 AM   Specimen: Urine, Catheterized  Result Value Ref Range Status   Specimen Description URINE, CATHETERIZED  Final   Special Requests NONE  Final   Culture   Final    NO GROWTH Performed at [facility], [address], [place], [name] [address]    Report Status 02/04/2023 FINAL  Final  Culture, blood (routine x 2)  Status: None   Collection Time: 02/03/23  1:57 AM   Specimen: BLOOD RIGHT HAND  Result Value Ref Range Status   Specimen Description BLOOD RIGHT HAND  Final   Special Requests   Final    BOTTLES DRAWN AEROBIC AND ANAEROBIC Blood Culture results may not be optimal due to an excessive volume of blood received in culture bottles   Culture   Final    NO GROWTH 5 DAYS Performed at [facility], [address] [place], [name] [address]    Report Status 02/08/2023 FINAL  Final  Culture, blood (routine x 2)     Status: None   Collection Time: 02/03/23  1:57 AM   Specimen: BLOOD LEFT HAND  Result Value Ref Range Status   Specimen  Description BLOOD LEFT HAND  Final   Special Requests   Final    BOTTLES DRAWN AEROBIC AND ANAEROBIC Blood Culture adequate volume   Culture   Final    NO GROWTH 5 DAYS Performed at [facility], [address], [place], [name] [address]    Report Status 02/08/2023 FINAL  Final     Labs: BNP (last 3 results) Recent Labs    09/04/22 1500 12/29/22 0203  BNP 188.2* 374.0*   Basic Metabolic Panel: Recent Labs  Lab 02/03/23 0613 02/04/23 0202 02/05/23 0142 02/07/23 0109 02/08/23 0135  NA 143 135 135 138 136  K 3.1* 4.5 3.6 4.3 4.4  CL 109 104 107 106 106  CO2 25 25 20* 24 24  GLUCOSE 96 245* 320* 161* 174*  BUN 31* 28* 24* 24* 26*  CREATININE 2.00* 2.09* 1.66* 1.88* 1.80*  CALCIUM 8.2* 8.7* 8.4* 8.8* 8.5*   Liver Function Tests: Recent Labs  Lab 02/02/23 2245  AST 32  ALT 24  ALKPHOS 122  BILITOT 0.7  PROT 5.3*  ALBUMIN 2.6*   No results for input(s): "LIPASE", "AMYLASE" in the last 168 hours. No results for input(s): "AMMONIA" in the last 168 hours. CBC: Recent Labs  Lab 02/02/23 2245 02/02/23 2316 02/03/23 0613 02/04/23 0202 02/05/23 0142  WBC 12.9*  --  12.5* 9.9 10.3  NEUTROABS 9.5*  --   --   --  7.2  HGB 11.8* 12.2* 11.4* 11.5* 11.5*  HCT 36.5* 36.0* 35.9* 35.2* 36.2*  MCV 90.8  --  91.1 90.3 90.7  PLT 310  --  269 260 256   Cardiac Enzymes: Recent Labs  Lab 02/03/23 0618  CKTOTAL 52   BNP: Invalid input(s): "POCBNP" CBG: Recent Labs  Lab 02/07/23 0755 02/07/23 1130 02/07/23 1552 02/07/23 2113 02/08/23 0612  GLUCAP 154* 246* 226* 103* 180*   D-Dimer No results for input(s): "DDIMER" in the last 72 hours. Hgb A1c No results for input(s): "HGBA1C" in the last 72 hours. Lipid Profile No results for input(s): "CHOL", "HDL", "LDLCALC", "TRIG", "CHOLHDL", "LDLDIRECT" in the last 72 hours. Thyroid function studies No results for input(s): "TSH", "T4TOTAL", "T3FREE", "THYROIDAB" in the last 72 hours.  Invalid input(s):  "FREET3" Anemia work up No results for input(s): "VITAMINB12", "FOLATE", "FERRITIN", "TIBC", "IRON", "RETICCTPCT" in the last 72 hours. Urinalysis    Component Value Date/Time   COLORURINE YELLOW 02/03/2023 1607   APPEARANCEUR CLEAR 02/03/2023 1607   LABSPEC 1.018 02/03/2023 1607   PHURINE 6.0 02/03/2023 1607   GLUCOSEU NEGATIVE 02/03/2023 1607   HGBUR NEGATIVE 02/03/2023 1607   BILIRUBINUR NEGATIVE 02/03/2023 1607   KETONESUR NEGATIVE 02/03/2023 1607   PROTEINUR 100 (A) 02/03/2023 1607   UROBILINOGEN  0.2 10/02/2009 1604   NITRITE NEGATIVE 02/03/2023 1607   LEUKOCYTESUR NEGATIVE 02/03/2023 1607   Sepsis Labs Recent Labs  Lab 02/02/23 2245 02/03/23 0613 02/04/23 0202 02/05/23 0142  WBC 12.9* 12.5* 9.9 10.3   Microbiology Recent Results (from the past 240 hour(s))  Urine Culture     Status: None   Collection Time: 02/03/23 12:53 AM   Specimen: Urine, Catheterized  Result Value Ref Range Status   Specimen Description URINE, CATHETERIZED  Final   Special Requests NONE  Final   Culture   Final    NO GROWTH Performed at [facility], [address], [place], [name] [address]    Report Status 02/04/2023 FINAL  Final  Culture, blood (routine x 2)     Status: None   Collection Time: 02/03/23  1:57 AM   Specimen: BLOOD RIGHT HAND  Result Value Ref Range Status   Specimen Description BLOOD RIGHT HAND  Final   Special Requests   Final    BOTTLES DRAWN AEROBIC AND ANAEROBIC Blood Culture results may not be optimal due to an excessive volume of blood received in culture bottles   Culture   Final    NO GROWTH 5 DAYS Performed at [facility], [address] [place], [name] [address]    Report Status 02/08/2023 FINAL  Final  Culture, blood (routine x 2)     Status: None   Collection Time: 02/03/23  1:57 AM   Specimen: BLOOD LEFT HAND  Result Value Ref Range Status   Specimen Description BLOOD LEFT HAND  Final   Special Requests   Final    BOTTLES DRAWN  AEROBIC AND ANAEROBIC Blood Culture adequate volume   Culture   Final    NO GROWTH 5 DAYS Performed at [facility], [address] [place], [name] [address]    Report Status 02/08/2023 FINAL  Final     Time coordinating discharge: Over 30 minutes  SIGNED:   Hughie Closs, MD  [facility] 02/08/2023, 9:59 AM *Please note that this is a verbal dictation therefore any spelling or grammatical errors are due to the "Dragon [employer] One" system interpretation. If 7PM-7AM, please contact night-coverage [email or web address]

## 2023-02-08 NOTE — Progress Notes (Signed)
Orthopedic Tech Progress Note Patient Details:  Fernando Hess May 30, 1946 [id number]  Patient ID: Fernando Hess, male   DOB: [birth year], 77 y.o.   MRN: [id number] I attended trauma page. Trinna Post 02/08/2023, 8:21 PM

## 2023-02-08 NOTE — Progress Notes (Signed)
   02/08/23 1950  Spiritual Encounters  Type of Visit Initial  Care provided to: Patient  Conversation partners present during encounter Nurse;Physician  Referral source Trauma page  Reason for visit Code  OnCall Visit Yes   Trauma level 2 fall on thinners downgraded to non-trauma. Patient was alert. No swelling present. Per patient, patient was discharge from hospital earlier today due to a fall and was in rehabilitation. No family present. Family was notified however wife does not drive and family alluded to not coming to hospital. No direct care provided.

## 2023-02-08 NOTE — Progress Notes (Signed)
RT note. Patient achieve   nif -40,   VC 2.2L

## 2023-02-08 NOTE — ED Provider Notes (Signed)
[place] EMERGENCY DEPARTMENT AT [facility] Provider Note   CSN: [id number] Arrival date & time: 02/08/23  1950     History  Chief Complaint  Patient presents with   Faust Dockstader is a 77 y.o. male.   Fall   This patient is a 77 year old male, he has atrial fibrillation on Eliquis, this is paroxysmal.  He also has a history of insulin dependent diabetes, acid reflux on pantoprazole, chronic constipation.  His medical history was recently complicated by a fall that caused a hip fracture, according to the medical record this fracture occurred at the beginning of April about a month and a half ago.  Since that time the patient was then seen again on April 21 for an unwitnessed fall, he then arrived on 24 April and had a transient alteration of awareness or altered mental status which was thought to be related to a metabolic encephalopathy.  He was discharged after a 5-day stay an unremarkable workup.  He was then again seen in the ER for a fall on May 2, then again on May 4.  I have reviewed the medical record, the hospitalist documentation is as follows.  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  He had come in from a skilled nursing facility due to encephalopathy and urinary retention.  He was afebrile, x-ray was done concerning for pneumonia, Foley catheter was placed and eventually removed with a normal voiding trial afterwards.  He worked with PT and OT and the patient was able to return to this skilled nursing facility.  There was concern for possible Lewy body dementia given his cognitive impairment and his frequent mechanical falls the patient was to follow-up with neurology clinic upon discharge.  The patient was then admitted to the hospital for an acute kidney injury on May 20, he spent approximately 6 days in the hospital and  was discharged today.  Of note it is noted that this patient had a reported history of myasthenia gravis, the patient was tachypneic and mildly hypotensive upon arrival at that visit, he had SIRS criteria because of the leukocytosis and tachypnea, no definitive source was found.  He had transient hypotension that responded to fluids, it was noted that he had some dementia and thought to have an ongoing metabolic encephalopathy.  At discharge the patient was fully oriented and alert.  The wife had noted that the patient has frequent falls at baseline.  The patient was seen by neurology and he was not thought to have myasthenia gravis exacerbation and neurology had signed off.  Evidently upon returning home today the patient was walking in the house with his four-wheel walker when he lost his balance and fell to the ground onto his right side.  The wife could not get him up so called paramedics.  They activated a level 2 trauma since the patient is anticoagulated suspecting that he may have bumped his head however the patient states that he has a slight pain to both sides of his head, he hurts in his right shoulder and his right hip, no deformities were noted by the paramedics, there was no signs of injury including bleeding, bruising, lacerations, hematomas or contusions.  The patient has no other complaints at this time    Home Medications Prior to Admission medications   Medication Sig Start Date End Date Taking? Authorizing  Provider  apixaban (ELIQUIS) 5 MG TABS tablet Take 1 tablet (5 mg total) by mouth 2 (two) times daily. 01/01/23   Lorin Glass, MD  atorvastatin (LIPITOR) 80 MG tablet TAKE ONE TABLET BY MOUTH EVERY EVENING Patient taking differently: Take 80 mg by mouth at bedtime. 11/13/22   Weaver, Lorin Picket T, [name]  B-D UF III MINI PEN NEEDLES 31G X 5 MM MISC USE AS DIRECTED WITH LANTUS SOLOSTAR 04/02/19   [provider]  budesonide (PULMICORT) 0.25 MG/2ML nebulizer solution Take 2 mLs  (0.25 mg total) by nebulization 2 (two) times daily. 01/01/23   Lorin Glass, MD  buPROPion (WELLBUTRIN XL) 150 MG 24 hr tablet Take 150 mg by mouth every morning. 12/10/22   [provider]  Continuous Blood Gluc Sensor (FREESTYLE LIBRE 2 SENSOR) MISC Inject 1 Device into the skin every 14 (fourteen) days.    [provider]  DULoxetine (CYMBALTA) 30 MG capsule Take 1 capsule (30 mg total) by mouth every morning. 01/13/23   Rhetta Mura, MD  feeding supplement, GLUCERNA SHAKE, (GLUCERNA SHAKE) LIQD Take 237 mLs by mouth 3 (three) times daily between meals. 01/01/23   Lorin Glass, MD  hydrALAZINE (APRESOLINE) 25 MG tablet Take 1 tablet (25 mg total) by mouth every 6 (six) hours as needed (SBP>160 or DBP>110). 01/12/23   Rhetta Mura, MD  insulin glargine-yfgn (SEMGLEE) 100 UNIT/ML injection Inject 0.3 mLs (30 Units total) into the skin daily. 02/08/23   Hughie Closs, MD  insulin lispro (HUMALOG) 100 UNIT/ML injection Inject 0-14 Units into the skin in the morning, at noon, in the evening, and at bedtime. 201-250= 2 units 251-300= 4 units, 301-350= 6 units, 351-400= 8 units, 401-450 = 10 units, 451-500= 12 units. If >500 or high, give 14 units.    [provider]  MAGNESIUM PO Take 400 mg by mouth daily.    [provider]  Multiple Vitamins-Minerals (SENIOR MULTIVITAMIN PLUS PO) Take 1 tablet by mouth daily with breakfast.    [provider]  nitroGLYCERIN (NITROSTAT) 0.4 MG SL tablet Place 0.4 mg under the tongue every 5 (five) minutes as needed for chest pain.    [provider]  nystatin (MYCOSTATIN/NYSTOP) powder Apply 1 Application topically 2 (two) times daily as needed (skin irritation). Apply to the groin in the morning and evening    [provider]  OXYGEN Inhale 2 L into the lungs See admin instructions. Inhale 2 L every shift for shortness of breath    [provider]  pantoprazole (PROTONIX) 40 MG tablet  TAKE 1 TABLET BY MOUTH EVERY DAY Patient taking differently: Take 40 mg by mouth in the morning. 11/25/19   [name], MD  polyethylene glycol (MIRALAX / GLYCOLAX) 17 g packet Take 17 g by mouth 2 (two) times daily as needed for mild constipation. 01/09/23   Almon Hercules, MD  senna-docusate (SENOKOT-S) 8.6-50 MG tablet Take 1 tablet by mouth 2 (two) times daily as needed for moderate constipation. 01/09/23   Almon Hercules, MD  tamsulosin (FLOMAX) 0.4 MG CAPS capsule Take 1 capsule (0.4 mg total) by mouth daily after breakfast. Patient taking differently: Take 0.4 mg by mouth at bedtime. 01/02/23   Lorin Glass, MD      Allergies    Cilostazol, Dulaglutide, Levofloxacin, Liraglutide, and Lisinopril    Review of Systems   Review of Systems  All other systems reviewed and are negative.   Physical Exam Updated Vital Signs Temp 98.3 F (36.8 C) (Oral)  Ht 1.676 m (5\' 6" )   Wt 81.6 kg   SpO2 99%   BMI 29.04 kg/m  Physical Exam Vitals and nursing note reviewed.  Constitutional:      General: He is not in acute distress.    Appearance: He is well-developed.  HENT:     Head: Normocephalic and atraumatic.     Mouth/Throat:     Pharynx: No oropharyngeal exudate.  Eyes:     General: No scleral icterus.       Right eye: No discharge.        Left eye: No discharge.     Conjunctiva/sclera: Conjunctivae normal.     Pupils: Pupils are equal, round, and reactive to light.  Neck:     Thyroid: No thyromegaly.     Vascular: No JVD.  Cardiovascular:     Rate and Rhythm: Normal rate and regular rhythm.     Heart sounds: Normal heart sounds. No murmur heard.    No friction rub. No gallop.  Pulmonary:     Effort: Pulmonary effort is normal. No respiratory distress.     Breath sounds: Normal breath sounds. No wheezing or rales.     Comments: Tachypneic but speaks in full sentences Abdominal:     General: Bowel sounds are normal. There is no distension.     Palpations: Abdomen is soft.  There is no mass.     Tenderness: There is no abdominal tenderness.  Musculoskeletal:        General: No tenderness. Normal range of motion.     Cervical back: Normal range of motion and neck supple.     Right lower leg: No edema.     Left lower leg: No edema.     Comments: The patient has full range of motion of all 4 extremities, he can straight leg raise bilaterally without any difficulty, he can move both arms without any difficulty, no signs of trauma to the extremities  Lymphadenopathy:     Cervical: No cervical adenopathy.  Skin:    General: Skin is warm and dry.     Findings: No erythema or rash.  Neurological:     Mental Status: He is alert.     Coordination: Coordination normal.     Comments: The patient is able to answer questions, he appears to have some mild confusion but follows commands without difficulty and has no facial droop.  Psychiatric:        Behavior: Behavior normal.     ED Results / Procedures / Treatments   Labs (all labs ordered are listed, but only abnormal results are displayed) Labs Reviewed  CBC WITH DIFFERENTIAL/PLATELET - Abnormal; Notable for the following components:      Result Value   WBC 10.7 (*)    RBC 3.85 (*)    Hemoglobin 11.2 (*)    HCT 35.4 (*)    RDW 16.4 (*)    All other components within normal limits  BASIC METABOLIC PANEL    EKG EKG Interpretation  Date/Time:  Sunday Feb 08 2023 20:36:57 EDT Ventricular Rate:  76 PR Interval:  137 QRS Duration: 127 QT Interval:  440 QTC Calculation: 475 R Axis:   6 Text Interpretation: Sinus rhythm Atrial premature complexes Right bundle branch block Inferior infarct, old Confirmed by Eber Hong ([id number]) on 02/08/2023 8:41:11 PM  Radiology DG Hip Unilat W or Wo Pelvis 2-3 Views Right  Result Date: 02/08/2023 CLINICAL DATA:  Trauma, fall.  Right hip pain. EXAM: DG HIP (WITH OR  WITHOUT PELVIS) 2-3V RIGHT COMPARISON:  01/08/2023. 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 There is no evidence of acute fracture or dislocation. No hardware loosening. There is no evidence of arthropathy or other focal bone abnormality. IMPRESSION: Total hip arthroplasty changes. No evidence of acute fracture, dislocation, or hardware loosening. Electronically Signed   By: Thornell Sartorius M.D.   On: 02/08/2023 21:27   DG Chest Port 1 View  Result Date: 02/08/2023 CLINICAL DATA:  Trauma, fall.  Shoulder pain. EXAM: PORTABLE CHEST 1 VIEW COMPARISON:  02/02/2023. FINDINGS: The heart size and mediastinal contours are within normal limits. There is atherosclerotic calcification of the aorta. No consolidation, effusion, or pneumothorax. Sternotomy wires are noted. No acute fracture or dislocation at the right shoulder. There are mild degenerative changes at the acromioclavicular joint. The soft tissues are normal. IMPRESSION: 1. No acute cardiopulmonary disease. 2. No acute fracture or dislocation at the right shoulder. Electronically Signed   By: Thornell Sartorius M.D.   On: 02/08/2023 21:25   DG Shoulder Right  Result Date: 02/08/2023 CLINICAL DATA:  Trauma, fall.  Shoulder pain. EXAM: PORTABLE CHEST 1 VIEW COMPARISON:  02/02/2023. FINDINGS: The heart size and mediastinal contours are within normal limits. There is atherosclerotic calcification of the aorta. No consolidation, effusion, or pneumothorax. Sternotomy wires are noted. No acute fracture or dislocation at the right shoulder. There are mild degenerative changes at the acromioclavicular joint. The soft tissues are normal. IMPRESSION: 1. No acute cardiopulmonary disease. 2. No acute fracture or dislocation at the right shoulder. Electronically Signed   By: Thornell Sartorius M.D.   On: 02/08/2023 21:25    Procedures Procedures    Medications Ordered in ED Medications - No data to display  ED Course/ Medical Decision Making/ A&P                             Medical Decision Making Amount and/or Complexity of Data Reviewed Labs:  ordered. Radiology: ordered. ECG/medicine tests: ordered.   The cause of the fall was likely mechanical, will wait for spouse to get further information, in the meantime just check some labs and a couple x-rays, I do not think he needs a CT scan of the head of the cervical spine as he has absolutely no signs of trauma to his head.  Labs unremarkable, vital signs unremarkable, imaging shows no signs of acute fractures or dislocations.  The patient states he is well-appearing and has no complaints, he states the pain is gone, the patient has no headache, no neck pain, stable for discharge        Final Clinical Impression(s) / ED Diagnoses Final diagnoses:  Fall, initial encounter  Contusion of right shoulder, initial encounter  Contusion of right hip, initial encounter    Rx / DC Orders ED Discharge Orders     None         Eber Hong, MD 02/08/23 2146

## 2023-02-08 NOTE — TOC Transition Note (Signed)
Transition of Care [facility]) - CM/SW Discharge Note   Patient Details  Name: Fernando Hess MRN: [id number] Date of Birth: November 09, 1945  [facility] of Care [facility] CM/SW Contact:  Gordy Clement, RN Phone Number: 02/08/2023, 11:54 AM   Clinical Narrative:  Patient will dc to home via PTAR. Patient has  been ordered a nebulizer and a BSC to be delivered to the home. Confirmed by [employer].  Patient will be receiving HH from [facility] .   Of note  Wife has a severe allergy to polyester eand has requested a wheelchair without polyester seats.  [employer] does not have in stock but stated they would assist in trying to locate one to be ordered. Wife stated she is ok waiting.  PTAR will be scheduled for 2:00        Final next level of care: Home w Home Health Services Barriers to Discharge: Equipment Delay (trying to locate w/c without polyester)   Patient Goals and CMS Choice CMS Medicare.gov Compare Post Acute Care list provided to:: Patient    Discharge Placement                         Discharge Plan and Services Additional resources added to the After Visit Summary for   In-house Referral: Clinical Social Work Discharge Planning Services: CM Consult Post Acute Care Choice: Durable Medical Equipment, Home Health          DME Arranged: Bedside commode, Wheelchair manual, Nebulizer machine         HH Arranged: PT [facility] Agency: [facility] Date [facility] Agency Contacted: 02/06/23 Time HH Agency Contacted: 1631 Representative spoke with at [facility] Agency: Eber Jones  Social Determinants of Health (SDOH) Interventions SDOH Screenings   Food Insecurity: No Food Insecurity (02/03/2023)  Housing: Low Risk  (02/03/2023)  Transportation Needs: No Transportation Needs (02/03/2023)  Utilities: Not At Risk (02/03/2023)  Depression (PHQ2-9): Low Risk  (05/20/2019)  Tobacco Use: Low Risk  (02/02/2023)     Readmission Risk Interventions     No data to display

## 2023-02-26 ENCOUNTER — Encounter (HOSPITAL_COMMUNITY): Payer: Self-pay

## 2023-02-26 ENCOUNTER — Emergency Department (HOSPITAL_COMMUNITY): Payer: Medicare Other

## 2023-02-26 ENCOUNTER — Observation Stay (HOSPITAL_COMMUNITY)
Admission: EM | Admit: 2023-02-26 | Discharge: 2023-03-01 | Disposition: A | Discharge status: Home Health | Payer: Medicare Other | Attending: Internal Medicine | Admitting: Internal Medicine

## 2023-02-26 ENCOUNTER — Other Ambulatory Visit: Payer: Self-pay

## 2023-02-26 DIAGNOSIS — Z79899 Other long term (current) drug therapy: Secondary | ICD-10-CM | POA: Insufficient documentation

## 2023-02-26 DIAGNOSIS — M6281 Muscle weakness (generalized): Secondary | ICD-10-CM | POA: Diagnosis not present

## 2023-02-26 DIAGNOSIS — I5032 Chronic diastolic (congestive) heart failure: Secondary | ICD-10-CM | POA: Diagnosis not present

## 2023-02-26 DIAGNOSIS — Z794 Long term (current) use of insulin: Secondary | ICD-10-CM | POA: Diagnosis not present

## 2023-02-26 DIAGNOSIS — Z951 Presence of aortocoronary bypass graft: Secondary | ICD-10-CM | POA: Insufficient documentation

## 2023-02-26 DIAGNOSIS — R2681 Unsteadiness on feet: Secondary | ICD-10-CM | POA: Insufficient documentation

## 2023-02-26 DIAGNOSIS — Z862 Personal history of diseases of the blood and blood-forming organs and certain disorders involving the immune mechanism: Secondary | ICD-10-CM

## 2023-02-26 DIAGNOSIS — Z7901 Long term (current) use of anticoagulants: Secondary | ICD-10-CM | POA: Diagnosis not present

## 2023-02-26 DIAGNOSIS — I130 Hypertensive heart and chronic kidney disease with heart failure and stage 1 through stage 4 chronic kidney disease, or unspecified chronic kidney disease: Secondary | ICD-10-CM | POA: Insufficient documentation

## 2023-02-26 DIAGNOSIS — R531 Weakness: Secondary | ICD-10-CM

## 2023-02-26 DIAGNOSIS — U071 COVID-19: Principal | ICD-10-CM

## 2023-02-26 DIAGNOSIS — N1832 Chronic kidney disease, stage 3b: Secondary | ICD-10-CM | POA: Diagnosis not present

## 2023-02-26 DIAGNOSIS — R296 Repeated falls: Secondary | ICD-10-CM | POA: Insufficient documentation

## 2023-02-26 DIAGNOSIS — I480 Paroxysmal atrial fibrillation: Secondary | ICD-10-CM | POA: Diagnosis not present

## 2023-02-26 DIAGNOSIS — N189 Chronic kidney disease, unspecified: Secondary | ICD-10-CM

## 2023-02-26 DIAGNOSIS — R059 Cough, unspecified: Secondary | ICD-10-CM | POA: Diagnosis present

## 2023-02-26 DIAGNOSIS — I5030 Unspecified diastolic (congestive) heart failure: Secondary | ICD-10-CM | POA: Diagnosis present

## 2023-02-26 DIAGNOSIS — E1122 Type 2 diabetes mellitus with diabetic chronic kidney disease: Secondary | ICD-10-CM | POA: Insufficient documentation

## 2023-02-26 DIAGNOSIS — I2510 Atherosclerotic heart disease of native coronary artery without angina pectoris: Secondary | ICD-10-CM | POA: Diagnosis not present

## 2023-02-26 DIAGNOSIS — E1169 Type 2 diabetes mellitus with other specified complication: Secondary | ICD-10-CM

## 2023-02-26 DIAGNOSIS — E785 Hyperlipidemia, unspecified: Secondary | ICD-10-CM | POA: Diagnosis present

## 2023-02-26 DIAGNOSIS — E119 Type 2 diabetes mellitus without complications: Secondary | ICD-10-CM

## 2023-02-26 DIAGNOSIS — R2689 Other abnormalities of gait and mobility: Secondary | ICD-10-CM | POA: Insufficient documentation

## 2023-02-26 DIAGNOSIS — N400 Enlarged prostate without lower urinary tract symptoms: Secondary | ICD-10-CM | POA: Diagnosis present

## 2023-02-26 DIAGNOSIS — E782 Mixed hyperlipidemia: Secondary | ICD-10-CM

## 2023-02-26 LAB — CBC
HCT: 38.9 % — ABNORMAL LOW (ref 39.0–52.0)
Hemoglobin: 12.1 g/dL — ABNORMAL LOW (ref 13.0–17.0)
MCH: 28.9 pg (ref 26.0–34.0)
MCHC: 31.1 g/dL (ref 30.0–36.0)
MCV: 93.1 fL (ref 80.0–100.0)
Platelets: 196 10*3/uL (ref 150–400)
RBC: 4.18 MIL/uL — ABNORMAL LOW (ref 4.22–5.81)
RDW: 15.7 % — ABNORMAL HIGH (ref 11.5–15.5)
WBC: 8.1 10*3/uL (ref 4.0–10.5)
nRBC: 0 % (ref 0.0–0.2)

## 2023-02-26 LAB — CBG MONITORING, ED
Glucose-Capillary: 336 mg/dL — ABNORMAL HIGH (ref 70–99)
Glucose-Capillary: 429 mg/dL — ABNORMAL HIGH (ref 70–99)

## 2023-02-26 LAB — URINALYSIS, ROUTINE W REFLEX MICROSCOPIC
Bilirubin Urine: NEGATIVE
Glucose, UA: >=500 mg/dL — AB
Hgb urine dipstick: NEGATIVE
Ketones, ur: NEGATIVE mg/dL
Leukocytes,Ua: NEGATIVE
Nitrite: NEGATIVE
Protein, ur: 100 mg/dL — AB
Specific Gravity, Urine: >1.03 — ABNORMAL HIGH (ref 1.005–1.030)
pH: 6 (ref 5.0–8.0)

## 2023-02-26 LAB — BASIC METABOLIC PANEL
Anion gap: 9 (ref 5–15)
BUN: 34 mg/dL — ABNORMAL HIGH (ref 8–23)
CO2: 23 mmol/L (ref 22–32)
Calcium: 7.8 mg/dL — ABNORMAL LOW (ref 8.9–10.3)
Chloride: 101 mmol/L (ref 98–111)
Creatinine, Ser: 1.91 mg/dL — ABNORMAL HIGH (ref 0.61–1.24)
GFR, Estimated: 35 mL/min — ABNORMAL LOW (ref >60)
Glucose, Bld: 352 mg/dL — ABNORMAL HIGH (ref 70–99)
Potassium: 4.7 mmol/L (ref 3.5–5.1)
Sodium: 133 mmol/L — ABNORMAL LOW (ref 135–145)

## 2023-02-26 LAB — URINALYSIS, MICROSCOPIC (REFLEX): RBC / HPF: NONE SEEN RBC/hpf (ref 0–5)

## 2023-02-26 LAB — PROTIME-INR
INR: 1.4 — ABNORMAL HIGH (ref 0.8–1.2)
Prothrombin Time: 17.6 seconds — ABNORMAL HIGH (ref 11.4–15.2)

## 2023-02-26 LAB — C-REACTIVE PROTEIN: CRP: <0.5 mg/dL (ref <1.0)

## 2023-02-26 LAB — SARS CORONAVIRUS 2 BY RT PCR: SARS Coronavirus 2 by RT PCR: POSITIVE — AB

## 2023-02-26 LAB — MAGNESIUM: Magnesium: 2 mg/dL (ref 1.7–2.4)

## 2023-02-26 MED ORDER — APIXABAN 5 MG PO TABS
5.0000 mg | ORAL_TABLET | Freq: Two times a day (BID) | ORAL | Status: DC
  Administered 2023-02-26 – 2023-03-01 (×6): 5 mg via ORAL
  Filled 2023-02-26 (×6): qty 1

## 2023-02-26 MED ORDER — INSULIN ASPART 100 UNIT/ML IJ SOLN
0.0000 [IU] | Freq: Every day | INTRAMUSCULAR | Status: DC
  Administered 2023-02-26 – 2023-02-28 (×3): 5 [IU] via SUBCUTANEOUS

## 2023-02-26 MED ORDER — HYDRALAZINE HCL 25 MG PO TABS
25.0000 mg | ORAL_TABLET | Freq: Four times a day (QID) | ORAL | Status: DC | PRN
  Filled 2023-02-26 (×2): qty 1

## 2023-02-26 MED ORDER — LINAGLIPTIN 5 MG PO TABS
5.0000 mg | ORAL_TABLET | Freq: Every day | ORAL | Status: DC
  Administered 2023-02-27 – 2023-03-01 (×3): 5 mg via ORAL
  Filled 2023-02-26 (×3): qty 1

## 2023-02-26 MED ORDER — MELATONIN 3 MG PO TABS
3.0000 mg | ORAL_TABLET | Freq: Every evening | ORAL | Status: DC | PRN
  Administered 2023-02-26: 3 mg via ORAL
  Filled 2023-02-26: qty 1

## 2023-02-26 MED ORDER — TAMSULOSIN HCL 0.4 MG PO CAPS
0.4000 mg | ORAL_CAPSULE | Freq: Every day | ORAL | Status: DC
  Administered 2023-02-27 – 2023-02-28 (×3): 0.4 mg via ORAL
  Filled 2023-02-26 (×3): qty 1

## 2023-02-26 MED ORDER — ACETAMINOPHEN 650 MG RE SUPP: 650.0000 mg | Freq: Four times a day (QID) | RECTAL | Status: DC | PRN

## 2023-02-26 MED ORDER — ACETAMINOPHEN 325 MG PO TABS: 650.0000 mg | ORAL_TABLET | Freq: Four times a day (QID) | ORAL | Status: DC | PRN

## 2023-02-26 MED ORDER — INSULIN ASPART 100 UNIT/ML IJ SOLN
0.0000 [IU] | Freq: Three times a day (TID) | INTRAMUSCULAR | Status: DC
  Administered 2023-02-27: 3 [IU] via SUBCUTANEOUS
  Administered 2023-02-27: 5 [IU] via SUBCUTANEOUS
  Administered 2023-02-27 – 2023-02-28 (×2): 3 [IU] via SUBCUTANEOUS
  Administered 2023-02-28: 9 [IU] via SUBCUTANEOUS
  Administered 2023-02-28: 7 [IU] via SUBCUTANEOUS
  Administered 2023-03-01: 2 [IU] via SUBCUTANEOUS
  Administered 2023-03-01: 3 [IU] via SUBCUTANEOUS

## 2023-02-26 MED ORDER — INSULIN GLARGINE-YFGN 100 UNIT/ML ~~LOC~~ SOLN
18.0000 [IU] | Freq: Every day | SUBCUTANEOUS | Status: DC
  Administered 2023-02-26: 18 [IU] via SUBCUTANEOUS
  Filled 2023-02-26 (×3): qty 0.18

## 2023-02-26 MED ORDER — ONDANSETRON HCL 4 MG/2ML IJ SOLN: 4.0000 mg | Freq: Four times a day (QID) | INTRAMUSCULAR | Status: DC | PRN

## 2023-02-26 MED ORDER — BENZONATATE 100 MG PO CAPS
100.0000 mg | ORAL_CAPSULE | Freq: Three times a day (TID) | ORAL | Status: DC | PRN
  Administered 2023-02-26: 100 mg via ORAL
  Filled 2023-02-26: qty 1

## 2023-02-26 NOTE — H&P (Signed)
History and Physical      JAMAL HASSINGER ZOX:[id number] DOB: 1946/02/28 DOA: 02/26/2023; DOS: 02/26/2023  PCP: [name], MD  Patient coming from: home   I have personally briefly reviewed patient's old medical records in [facility]  Chief Complaint: Cough  HPI: Fernando Hess is a 77 y.o. 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   The patient reports 4 days of productive cough associated with rhinitis, rhinorrhea, noting that these symptoms started on Monday, 02/23/2023.  He notes that his wife had similar symptoms 2 to 3 days prior to the onset of his own symptoms, and was diagnosed with COVID-19 infection at that time.  The patient denies that he has been experiencing any associated shortness of breath.  Denies any associated wheezing, hemoptysis.  He notes subjective fever and generalized myalgias, in the absence of chills or full body rigors.  Denies any associated headache, neck stiffness, rash, chest pain, abdominal pain, diarrhea, dysuria, or gross hematuria.  He also reports to 3 days of generalized weakness, as well as some symmetrical weakness in the bilateral lower extremities.  Denies any associated acute focal sensory deficits.  He confirms an underlying history of myasthenia gravis.  Patient's medical history is also notable for chronic diastolic heart failure, with most recent echocardiogram occurring on 12/30/2022, which is notable for LVEF 66 5%, no focal motion maladies, grade 1 diastolic function, normal right ventricular systolic function, as well as mild mitral rotation in addition to mild aortic regurgitation.  Not on  a scheduled diuretic medications at home.  He also has a history of paroxysmal atrial fibrillation for which he is chronically anticoagulated on Eliquis.  Medical history also notable for type 2 diabetes mellitus.    ED Course:  Vital signs in the ED were notable for the following: Afebrile; heart rate 77-93; systolic blood pressures in the 120s to 140s; respiratory rate 18-25; oxygen saturation 96 to 100% on room air.  Labs were notable for the following: BMP notable for the following: Sodium 133, which corrects to approximately 137 taking into account concomitant hyperglycemia, potassium 4.7, bicarbonate 23, anion gap 9, creatinine 1.91 compared to most recent prior serum creatinine data point of 2.19 on 02/08/2023, glucose 352.[name]  CBC notable for white cell count 8100, hemoglobin 12.1.  Urinalysis notable for no white blood cells, leukocyte esterase/nitrate negative.  COVID-19 PCR positive.  Per my interpretation, EKG in ED demonstrated the following: Atrial fibrillation with heart rate 80, no evidence of T wave or ST changes, including no evidence of ST elevation.  Chest x-ray, performed radiology read, showed no evidence of acute cardiopulmonary process, including no evidence of infiltrate, edema, effusion, or pneumothorax.  While in the ED, the following were administered: None  Subsequently, the patient was admitted for further evaluation and management of acute COVID-19 infection, with presentation also notable for generalized weakness.     Review of Systems: As per HPI otherwise 10 point review of systems negative.   Past Medical History:  Diagnosis Date   Acute renal failure (ARF) ([name]) 09/24/2015   Acute stress disorder 11/13/2021   Atrial premature complexes    CAD (coronary artery disease) of artery bypass graft    Early occlusion of  saphenous vein graft to intermediate and marginal branch in February 2007 following bypass grafting    CAD (coronary artery disease), native  coronary artery 2017   hx NSTEMI 09-24-2015  s/p  CABG x5 on 10-02-2015;  post op STEMI inferolateral wall,  SVG OM1 and SVG OM2 occluded, distal OM occlusion the calpruit, treated medically // Myoview 7/21: no ischemia, EF 65, low risk   CHF (congestive heart failure) ([name])    CKD (chronic kidney disease), stage III ([name])    Contusion of right knee 03/16/2020   Dyspnea    Elevated troponin    Erectile dysfunction    Esophageal reflux    History of atrial fibrillation    post op CABG 10-02-2015   History of kidney stones    History of non-ST elevation myocardial infarction (NSTEMI) 09/24/2015   s/p  CABG x5   History of ST elevation myocardial infarction (STEMI) 10/22/2015   inferior wall,  post op CABG 10-02-2015   Hyperlipidemia    Hypertension    Left ureteral stone    Mild atherosclerosis of both carotid arteries    Nephrolithiasis    per CT bilateral non-obstructive calculi   OSA (obstructive sleep apnea)    Peripheral artery disease    LE Arterial US 01/2019: R PTA and ATA occluded; L ATA occluded   RBBB (right bundle branch block)    Renal atrophy, right    Sleep apnea    wears cpap    ST elevation myocardial infarction (STEMI) of inferior wall ([name]) 10/22/2015   Type 2 diabetes mellitus treated with insulin ([name])    followed by pcp   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) 03/01/2008   Wears glasses     Past Surgical History:  Procedure Laterality Date   APPENDECTOMY  1965   CARDIAC CATHETERIZATION N/A 09/26/2015   Procedure: Left Heart Cath and Coronary Angiography;  Surgeon: Lennette Bihari, MD;  Location: [facility] INVASIVE CV LAB;  Service: Cardiovascular;  Laterality: N/A;   CARDIAC CATHETERIZATION N/A 10/22/2015   Procedure: Left Heart Cath and Coronary Angiography;  Surgeon: Tonny Bollman, MD;  Location: [facility] INVASIVE CV LAB;  Service: Cardiovascular;  Laterality: N/A;   CATARACT EXTRACTION W/  INTRAOCULAR LENS  IMPLANT, BILATERAL  2017   COLONOSCOPY     CORONARY ARTERY BYPASS GRAFT N/A 10/02/2015   Procedure: CORONARY ARTERY BYPASS GRAFTING (CABG) X5 LIMA-LAD; SVG-DIAG; SVG-OM; SVG-PD; SVG-RAMUS TRANSESOPHAGEAL ECHOCARDIOGRAM (TEE) ENDOSCOPIC GREATER SAPHENOUS VEIN  HARVEST BILAT LE;  Surgeon: Kerin Perna, MD;  Location: [facility] OR;  Service: Open Heart Surgery;  Laterality: N/A;   CYSTOSCOPY/URETEROSCOPY/HOLMIUM LASER/STENT PLACEMENT Left 08/10/2018   Procedure: CYSTOSCOPY/URETEROSCOPY/HOLMIUM LASER/STENT PLACEMENT;  Surgeon: Jerilee Field, MD;  Location: [facility];  Service: Urology;  Laterality: Left;   CYSTOSCOPY/URETEROSCOPY/HOLMIUM LASER/STENT PLACEMENT Left 09/10/2018   Procedure: CYSTOSCOPY/URETEROSCOPY/HOLMIUM LASER/STENT EXCHANGE;  Surgeon: Jerilee Field, MD;  Location: [facility] ORS;  Service: Urology;  Laterality: Left;   HIP ARTHROPLASTY Right 12/26/2022   Procedure: RIGHT HEMI HIP ARTHROPLASTY;  Surgeon: Joen Laura, MD;  Location: [facility] OR;  Service: Orthopedics;  Laterality: Right;   LEFT HEART CATHETERIZATION WITH CORONARY ANGIOGRAM N/A 04/13/2014   Procedure: LEFT HEART CATHETERIZATION WITH CORONARY ANGIOGRAM;  Surgeon: Othella Boyer, MD;  Location: [facility] CATH LAB;  Service: Cardiovascular;  Laterality: N/A;   LEG SURGERY Right age 76   closed reduction leg fracture   NASAL SEPTOPLASTY W/ TURBINOPLASTY Bilateral 08/30/2021   Procedure: NASAL  SEPTOPLASTY WITH BILATERAL INFERIOR TURBINATE REDUCTION;  Surgeon: Osborn Coho, MD;  Location: [facility] OR;  Service: ENT;  Laterality: Bilateral;   POLYPECTOMY     RIGHT HEART CATH N/A 06/06/2021   Procedure: RIGHT HEART CATH;  Surgeon: Dolores Patty, MD;  Location: [facility] INVASIVE CV LAB;  Service: Cardiovascular;  Laterality: N/A;   TEE WITHOUT CARDIOVERSION N/A 10/02/2015   Procedure: TRANSESOPHAGEAL ECHOCARDIOGRAM (TEE);  Surgeon: Kerin Perna, MD;  Location: [facility] OR;  Service: Open Heart Surgery;   Laterality: N/A;   URETEROSCOPY WITH HOLMIUM LASER LITHOTRIPSY Bilateral 2004;  2005  dr grapey  @[facility]    VASECTOMY      Social History:  reports that he has never smoked. He has never used smokeless tobacco. He reports that he does not currently use alcohol. He reports that he does not use drugs.   Allergies  Allergen Reactions   Cilostazol Swelling and Other (See Comments)    Edema    Dulaglutide Nausea And Vomiting and Other (See Comments)    TRULICITY   Levofloxacin Hives, Itching and Rash   Liraglutide Other (See Comments)    Severe fatigue & insomnia   Lisinopril Itching, Rash and Cough    Family History  Problem Relation Age of Onset   Heart attack Mother    Heart attack Father    Diabetes Brother    Pancreatic cancer Brother    Diabetes Brother    Colon cancer Neg Hx    Esophageal cancer Neg Hx    Prostate cancer Neg Hx    Rectal cancer Neg Hx    Stomach cancer Neg Hx    Colon polyps Neg Hx     Family history reviewed and not pertinent    Prior to Admission medications   Medication Sig Start Date End Date Taking? Authorizing Provider  apixaban (ELIQUIS) 5 MG TABS tablet Take 1 tablet (5 mg total) by mouth 2 (two) times daily. 01/01/23   Lorin Glass, MD  atorvastatin (LIPITOR) 80 MG tablet TAKE ONE TABLET BY MOUTH EVERY EVENING Patient taking differently: Take 80 mg by mouth at bedtime. 11/13/22   Weaver, Lorin Picket T, [name]  B-D UF III MINI PEN NEEDLES 31G X 5 MM MISC USE AS DIRECTED WITH LANTUS SOLOSTAR 04/02/19   [provider]  budesonide (PULMICORT) 0.25 MG/2ML nebulizer solution Take 2 mLs (0.25 mg total) by nebulization 2 (two) times daily. 01/01/23   Lorin Glass, MD  buPROPion (WELLBUTRIN XL) 150 MG 24 hr tablet Take 150 mg by mouth every morning. 12/10/22   [provider]  Continuous Blood Gluc Sensor (FREESTYLE LIBRE 2 SENSOR) MISC Inject 1 Device into the skin every 14 (fourteen) days.    [provider]  DULoxetine (CYMBALTA) 30  MG capsule Take 1 capsule (30 mg total) by mouth every morning. 01/13/23   Rhetta Mura, MD  feeding supplement, GLUCERNA SHAKE, (GLUCERNA SHAKE) LIQD Take 237 mLs by mouth 3 (three) times daily between meals. 01/01/23   Lorin Glass, MD  hydrALAZINE (APRESOLINE) 25 MG tablet Take 1 tablet (25 mg total) by mouth every 6 (six) hours as needed (SBP>160 or DBP>110). 01/12/23   Rhetta Mura, MD  insulin glargine-yfgn (SEMGLEE) 100 UNIT/ML injection Inject 0.3 mLs (30 Units total) into the skin daily. 02/08/23   Hughie Closs, MD  insulin lispro (HUMALOG) 100 UNIT/ML injection Inject 0-14 Units into the skin in the morning, at noon, in the evening, and at bedtime. 201-250= 2 units 251-300= 4 units, 301-350= 6 units, 351-400= 8  units, 401-450 = 10 units, 451-500= 12 units. If >500 or high, give 14 units.    [provider]  MAGNESIUM PO Take 400 mg by mouth daily.    [provider]  Multiple Vitamins-Minerals (SENIOR MULTIVITAMIN PLUS PO) Take 1 tablet by mouth daily with breakfast.    [provider]  nitroGLYCERIN (NITROSTAT) 0.4 MG SL tablet Place 0.4 mg under the tongue every 5 (five) minutes as needed for chest pain.    [provider]  nystatin (MYCOSTATIN/NYSTOP) powder Apply 1 Application topically 2 (two) times daily as needed (skin irritation). Apply to the groin in the morning and evening    [provider]  OXYGEN Inhale 2 L into the lungs See admin instructions. Inhale 2 L every shift for shortness of breath    [provider]  pantoprazole (PROTONIX) 40 MG tablet TAKE 1 TABLET BY MOUTH EVERY DAY Patient taking differently: Take 40 mg by mouth in the morning. 11/25/19   [name], MD  polyethylene glycol (MIRALAX / GLYCOLAX) 17 g packet Take 17 g by mouth 2 (two) times daily as needed for mild constipation. 01/09/23   Almon Hercules, MD  senna-docusate (SENOKOT-S) 8.6-50 MG tablet Take 1 tablet by mouth 2 (two) times daily as  needed for moderate constipation. 01/09/23   Almon Hercules, MD  tamsulosin (FLOMAX) 0.4 MG CAPS capsule Take 1 capsule (0.4 mg total) by mouth daily after breakfast. Patient taking differently: Take 0.4 mg by mouth at bedtime. 01/02/23   Lorin Glass, MD     Objective    Physical Exam: Vitals:   02/26/23 1947 02/26/23 1949 02/26/23 2000 02/26/23 2030  BP:   (!) 143/91 (!) 142/97  Pulse:   86 79  Resp:   19 19  Temp:  98.7 F (37.1 C)    TempSrc:  Oral    SpO2:   100% 100%  Weight: 74.8 kg     Height: 5\' 6"  (1.676 m)       General: appears to be stated age; alert, oriented Skin: warm, dry, no rash Head:  AT/[place] Mouth:  Oral mucosa membranes appear moist, normal dentition Neck: supple; trachea midline Heart:  RRR; did not appreciate any M/R/G Lungs: CTAB, did not appreciate any wheezes, rales, or rhonchi Abdomen: + BS; soft, ND, NT Vascular: 2+ pedal pulses b/l; 2+ radial pulses b/l Extremities: no peripheral edema, no muscle wasting Neuro: strength and sensation intact in upper and lower extremities b/l     Labs on Admission: I have personally reviewed following labs and imaging studies  CBC: Recent Labs  Lab 02/26/23 1600  WBC 8.1  HGB 12.1*  HCT 38.9*  MCV 93.1  PLT 196   Basic Metabolic Panel: Recent Labs  Lab 02/26/23 1600  NA 133*  K 4.7  CL 101  CO2 23  GLUCOSE 352*  BUN 34*  CREATININE 1.91*  CALCIUM 7.8*   GFR: Estimated Creatinine Clearance: 28.8 mL/min (A) (by C-G formula based on SCr of 1.91 mg/dL (H)). Liver Function Tests: No results for input(s): "AST", "ALT", "ALKPHOS", "BILITOT", "PROT", "ALBUMIN" in the last 168 hours. No results for input(s): "LIPASE", "AMYLASE" in the last 168 hours. No results for input(s): "AMMONIA" in the last 168 hours. Coagulation Profile: Recent Labs  Lab 02/26/23 1600  INR 1.4*   Cardiac Enzymes: No results for input(s): "CKTOTAL", "CKMB", "CKMBINDEX", "TROPONINI" in the last 168 hours. BNP (last 3  results) No results for input(s): "PROBNP" in the last 8760  hours. HbA1C: No results for input(s): "HGBA1C" in the last 72 hours. CBG: Recent Labs  Lab 02/26/23 1611  GLUCAP 336*   Lipid Profile: No results for input(s): "CHOL", "HDL", "LDLCALC", "TRIG", "CHOLHDL", "LDLDIRECT" in the last 72 hours. Thyroid Function Tests: No results for input(s): "TSH", "T4TOTAL", "FREET4", "T3FREE", "THYROIDAB" in the last 72 hours. Anemia Panel: No results for input(s): "VITAMINB12", "FOLATE", "FERRITIN", "TIBC", "IRON", "RETICCTPCT" in the last 72 hours. Urine analysis:    Component Value Date/Time   COLORURINE YELLOW 02/26/2023 1949   APPEARANCEUR CLEAR 02/26/2023 1949   LABSPEC >1.030 (H) 02/26/2023 1949   PHURINE 6.0 02/26/2023 1949   GLUCOSEU >=500 (A) 02/26/2023 1949   HGBUR NEGATIVE 02/26/2023 1949   BILIRUBINUR NEGATIVE 02/26/2023 1949   KETONESUR NEGATIVE 02/26/2023 1949   PROTEINUR 100 (A) 02/26/2023 1949   UROBILINOGEN 0.2 10/02/2009 1604   NITRITE NEGATIVE 02/26/2023 1949   LEUKOCYTESUR NEGATIVE 02/26/2023 1949    Radiological Exams on Admission: DG Chest Portable 1 View  Result Date: 02/26/2023 CLINICAL DATA:  Weakness. Shortness of breath. Diarrhea. The patient's wife was diagnosed with COVID yesterday. EXAM: PORTABLE CHEST 1 VIEW COMPARISON:  One view chest x-ray FINDINGS: Heart size is exaggerated by low lung volumes. The right hemidiaphragm is slightly elevated. No edema or effusion is present. No focal airspace disease is present. The visualized soft tissues and bony thorax are unremarkable. IMPRESSION: 1. Low lung volumes. 2. No acute cardiopulmonary disease. Electronically Signed   By: Marin Roberts M.D.   On: 02/26/2023 16:39      Assessment/Plan   Principal Problem:   COVID-19 Active Problems:   Paroxysmal atrial fibrillation ([name])   Chronic diastolic CHF (congestive heart failure) ([name])   Generalized weakness   CKD stage 3b, GFR 30-44 ml/min ([name])    Hyperlipidemia   DM2 (diabetes mellitus, type 2) ([name])   BPH (benign prostatic hyperplasia)   History of anemia due to chronic kidney disease      #) COVID-19 infection: diagnosis on the basis of: 4 days of new onset rhinitis, rhinorrhea, productive cough, subjective fever, generalized myalgias, with COVID-19 PCR found to be positive.  This is in the setting of recent sick contact, with the patient's wife experiencing similar symptoms last week resulting in diagnosis of COVID-19 infection at that time. Of note, presentation does not appear to be associated with acute hypoxic respiratory distress/failure, with patient maintaining O2 sats greater than 94% on room air. Overall, it does not appear that criteria are met at the present time for patient's COVID-19 infection to be considered severe in nature. Consequently, there does not appear to be an indication at this time for initiation of dexamethasone per treatment guidance recommendations from [facility]'s Covid Treatment Guidelines.   Of note, in the setting of the patient's age as well as multiple core abilities, including chronic diastolic heart failure, type 2 diabetes mellitus, CKD 3B, this patient meets criteria to be considered high risk for a more complicated clinical course of COVID-19 infection, including increased probability for progression of the severity associated with this infection. Therefore, in the setting of symptomatic COVID-19 infection requiring hospitalization for further evaluation and management thereof in this patient with the aforementioned high risk criteria who is felt to be early in the course of their infection given onset of respiratory symptoms starting less than 7 days ago, there were considerations for initiation of antiviral therapy.   However, in the absence of acute hypoxic respiratory distress, and in the absence of chest x-ray showing  infiltrate, criteria are not met for initiation remdesivir.  Subsequently,  consideration was given to initiation of Paxlovid.  However, he is also not a candidate for the latter antiviral medication due to contraindication in the setting of his chronic anticoagulation on Eliquis as an outpatient.  However, will initiate daily linagliptin as DPP-4 inhibitors have been shown to reduce mortality in patients with DM2 and a COVID-19.  Will initiate additional supportive measures and trend inflammatory markers, as further detailed below.   Plan: Airborne and contact precautions. Monitor continuous pulse oximetry and monitor on telemetry. prn supplemental O2 to maintain O2 sats greater than or equal to 94%. Proning protocol initiated. PRN albuterol inhaler.  PRN acetaminophen for fever. Refraining from initiation of dexamethasone,, remdesivir, and Paxlovid, as above. Check and trend CRP.  Check serum magnesium and phosphorus levels. Check CMP and CBC in the morning. Flutter valve and incentive spirometry.  In setting of DM2, will start linagliptin 5 mg PO Qdaily for associated mortality benefit, as above. check procalcitonin, which, if negative in the context of the pro inflammatory state associated with COVID-19, would provide a degree of negative predictive value that would further render the possibility of bacterial pneumonia to be less likely.             #) Generalized weakness: Generalized weakness with some symmetrical weakness in the bilateral lower extremities.  No evidence of acute focal weakness.  Suspect that this is on the basis of physiologic stress stemming from the patient's acute viral infection.  However, given his history of underlying myasthenia gravis, and increased risk for exacerbation on the basis of acute viral infection, EDP has discussed patient's case with on-call neurology, who will formally consult and evaluate the patient to assess for the presence of underlying acute myasthenia gravis exacerbation.  Given the increased risk for the latter, will  hold his home atorvastatin for now.  No evidence of additional underlying infectious process, including urinalysis that is inconsistent with UTI.  Plan: Neurology to formally consult, as above.  Holding home atorvastatin for now.  Fall precautions, PT/OT consults.  Check B12, CPK, TSH.  Check procalcitonin level.  Further evaluation management of presenting COVID-19 faction, as above.  Repeat CMP, CBC in the morning.  Check serum magnesium level.                    #) Chronic diastolic heart failure: documented history of such, with most recent echocardiogram performed in April 2024, which was notable for LVEF 60 to 65% as well as grade 1 diastolic dysfunction, with additional results as conveyed above. No clinical or radiographic evidence to suggest acutely decompensated heart failure at this time. home diuretic regimen reportedly consists of the following: None.   Plan: monitor strict I's & O's and daily weights. Repeat CMP in AM. Check serum mag level. Add on BNP.               #) Paroxysmal atrial fibrillation: Documented history of such. In setting of CHA2DS2-VASc score of 8, there is an indication for chronic anticoagulation for thromboembolic prophylaxis. Consistent with this, patient is chronically anticoagulated on Eliquis, which is notable when considering potential initiation of antiviral medications in the setting of acute COVID-19 infection, as above. Home AV nodal blocking regimen: None.  Most recent echocardiogram was performed in April 2024, with results as conveyed above. Presenting EKG rate controlled atrial fibrillation without overt evidence of acute ischemic changes.   Plan: monitor strict I's & O's and  daily weights. CMP/CBC in AM. Check serum mag level. Continue outpatient Eliquis.  Monitor on telemetry.                   #) CKD Stage 3B: Documented history of such, with baseline creatinine 1.7-2.3, with presenting creatinine  consistent with this baseline.    Plan: Monitor strict I's and O's and daily weights.  Attempt to avoid nephrotoxic agents.  CMP/magnesium level in the AM.                   #) Hyperlipidemia: documented h/o such. On high intensity atorvastatin as outpatient.  Given increased risk for associated development of acute myasthenia gravis exacerbation, will hold atorvastatin for now, particularly given existing risk factors for exacerbation in the form of acute COVID-19 infection, as above.  Plan: Hold home statin for now, as above.                    #) Type 2 Diabetes Mellitus: documented history of such. Home insulin regimen: Semglee 30 units SQ nightly in addition to Humalog 3 times daily sliding scale. Home oral hypoglycemic agents: None. presenting blood sugar: 352, without any corresponding evidence of anion gap metabolic acidosis. Most recent A1c noted to be 10.9% when checked in May 2024.  in terms of initial dose of basal insulin to be started during this hospitalization, will resume approximately half of outpatient dose in order to reduce risk for ensuing hypoglycemia   Plan: accuchecks QAC and HS with low dose SSI.  Lantus 18 units SQ nightly, as above.                   #) Benign Prostatic Hyperplasia:  documented h/o such; on tamsulosin as outpatient.   Plan: monitor strict I's & O's and daily weights. Repeat CMP in AM.  continue home tamsulosin.                 #) Anemia of chronic kidney disease: Documented history of such, a/w with baseline hgb range 11-12, with presenting hgb consistent with this range, in the absence of any overt evidence of active bleed.     Plan: Repeat CBC in the morning.     DVT prophylaxis: SCD's + home Eliquis Code Status: Full code Family Communication: none Disposition Plan: Per Rounding Team Consults called: EDP d/w on-call neurology (Dr. Amada Jupiter), who will formally consult, as  further detailed above;  Admission status: inpatient     I SPENT GREATER THAN 75  MINUTES IN CLINICAL CARE TIME/MEDICAL DECISION-MAKING IN COMPLETING THIS ADMISSION.      Chaney Born Ellamae Lybeck DO [facility]  From 7PM - 7AM   02/26/2023, 9:48 PM

## 2023-02-26 NOTE — ED Triage Notes (Signed)
Pt to ED via EMS from home withc/o weakness, sob and diarrhea. Per EMS  pt wife was dx with covid yesterday and pt started feeling sick today Pt reports extremity weakness Pt Aox4 in triage

## 2023-02-26 NOTE — ED Notes (Signed)
ED TO INPATIENT HANDOFF REPORT  ED Nurse Name and Phone #: Grant Fontana PM [phone]   S Name/Age/Gender Fernando Hess 77 y.o. male Room/Bed: 034C/034C  Code Status   Code Status: Full Code  Home/SNF/Other Home Patient oriented to: self, place, time, and situation Is this baseline? Yes   Triage Complete: Triage complete  Chief Complaint COVID-19 [U07.1]  Triage Note Pt to ED via EMS from home withc/o weakness, sob and diarrhea. Per EMS  pt wife was dx with covid yesterday and pt started feeling sick today Pt reports extremity weakness Pt Aox4 in triage   Allergies Allergies  Allergen Reactions   Cilostazol Swelling and Other (See Comments)    Edema    Dulaglutide Nausea And Vomiting and Other (See Comments)    TRULICITY   Levofloxacin Hives, Itching and Rash   Liraglutide Other (See Comments)    Severe fatigue & insomnia   Lisinopril Itching, Rash and Cough    Level of Care/Admitting Diagnosis ED Disposition     ED Disposition  Admit   Condition  --   Comment  Hospital Area: [facility] [[id number]]  Level of Care: Telemetry Medical [104]  May admit patient to [facility] or [facility] if equivalent level of care is available:: No  Covid Evaluation: Asymptomatic - no recent exposure (last 10 days) testing not required  Diagnosis: COVID-19 [[id number]]  Admitting Physician: Angie Fava [[id number]]  Attending Physician: Angie Fava [[id number]]  Certification:: I certify this patient will need inpatient services for at least 2 midnights  Estimated Length of Stay: 2          B Medical/Surgery History Past Medical History:  Diagnosis Date   Acute renal failure (ARF) ([name]) 09/24/2015   Acute stress disorder 11/13/2021   Atrial premature complexes    CAD (coronary artery disease) of artery bypass graft    Early occlusion of saphenous vein graft to intermediate and marginal Bettymae Yott in February 2007 following bypass grafting    CAD  (coronary artery disease), native coronary artery 2017   hx NSTEMI 09-24-2015  s/p  CABG x5 on 10-02-2015;  post op STEMI inferolateral wall,  SVG OM1 and SVG OM2 occluded, distal OM occlusion the calpruit, treated medically // Myoview 7/21: no ischemia, EF 65, low risk   CHF (congestive heart failure) ([name])    CKD (chronic kidney disease), stage III ([name])    Contusion of right knee 03/16/2020   Dyspnea    Elevated troponin    Erectile dysfunction    Esophageal reflux    History of atrial fibrillation    post op CABG 10-02-2015   History of kidney stones    History of non-ST elevation myocardial infarction (NSTEMI) 09/24/2015   s/p  CABG x5   History of ST elevation myocardial infarction (STEMI) 10/22/2015   inferior wall,  post op CABG 10-02-2015   Hyperlipidemia    Hypertension    Left ureteral stone    Mild atherosclerosis of both carotid arteries    Nephrolithiasis    per CT bilateral non-obstructive calculi   OSA (obstructive sleep apnea)    Peripheral artery disease    LE Arterial US 01/2019: R PTA and ATA occluded; L ATA occluded   RBBB (right bundle Sheneika Walstad block)    Renal atrophy, right    Sleep apnea    wears cpap    ST elevation myocardial infarction (STEMI) of inferior wall ([name]) 10/22/2015   Type 2 diabetes mellitus treated with insulin (  [name])    followed by pcp   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) 03/01/2008   Wears glasses    Past Surgical History:  Procedure Laterality Date   APPENDECTOMY  1965   CARDIAC CATHETERIZATION N/A 09/26/2015   Procedure: Left Heart Cath and Coronary Angiography;  Surgeon: Lennette Bihari, MD;  Location: [facility] INVASIVE CV LAB;  Service: Cardiovascular;  Laterality: N/A;   CARDIAC CATHETERIZATION N/A 10/22/2015   Procedure: Left Heart Cath and Coronary Angiography;  Surgeon: Tonny Bollman, MD;  Location: [facility] INVAS[facility] CV LAB;  Service: Cardiovascular;  Laterality:  N/A;   CATARACT EXTRACTION W/ INTRAOCULAR LENS  IMPLANT, BILATERAL  2017   COLONOSCOPY     CORONARY ARTERY BYPASS GRAFT N/A 10/02/2015   Procedure: CORONARY ARTERY BYPASS GRAFTING (CABG) X5 LIMA-LAD; SVG-DIAG; SVG-OM; SVG-PD; SVG-RAMUS TRANSESOPHAGEAL ECHOCARDIOGRAM (TEE) ENDOSCOPIC GREATER SAPHENOUS VEIN  HARVEST BILAT LE;  Surgeon: Kerin Perna, MD;  Location: [facility] OR;  Service: Open Heart Surgery;  Laterality: N/A;   CYSTOSCOPY/URETEROSCOPY/HOLMIUM LASER/STENT PLACEMENT Left 08/10/2018   Procedure: CYSTOSCOPY/URETEROSCOPY/HOLMIUM LASER/STENT PLACEMENT;  Surgeon: Jerilee Field, MD;  Location: [facility];  Service: Urology;  Laterality: Left;   CYSTOSCOPY/URETEROSCOPY/HOLMIUM LASER/STENT PLACEMENT Left 09/10/2018   Procedure: CYSTOSCOPY/URETEROSCOPY/HOLMIUM LASER/STENT EXCHANGE;  Surgeon: Jerilee Field, MD;  Location: [facility] ORS;  Service: Urology;  Laterality: Left;   HIP ARTHROPLASTY Right 12/26/2022   Procedure: RIGHT HEMI HIP ARTHROPLASTY;  Surgeon: Joen Laura, MD;  Location: [facility] OR;  Service: Orthopedics;  Laterality: Right;   LEFT HEART CATHETERIZATION WITH CORONARY ANGIOGRAM N/A 04/13/2014   Procedure: LEFT HEART CATHETERIZATION WITH CORONARY ANGIOGRAM;  Surgeon: Othella Boyer, MD;  Location: [facility] CATH LAB;  Service: Cardiovascular;  Laterality: N/A;   LEG SURGERY Right age 88   closed reduction leg fracture   NASAL SEPTOPLASTY W/ TURBINOPLASTY Bilateral 08/30/2021   Procedure: NASAL SEPTOPLASTY WITH BILATERAL INFERIOR TURBINATE REDUCTION;  Surgeon: Osborn Coho, MD;  Location: [facility] OR;  Service: ENT;  Laterality: Bilateral;   POLYPECTOMY     RIGHT HEART CATH N/A 06/06/2021   Procedure: RIGHT HEART CATH;  Surgeon: Dolores Patty, MD;  Location: [facility] INVASIVE CV LAB;  Service: Cardiovascular;  Laterality: N/A;   TEE WITHOUT CARDIOVERSION N/A 10/02/2015   Procedure: TRANSESOPHAGEAL ECHOCARDIOGRAM (TEE);  Surgeon: Kerin Perna, MD;  [facility];   Service: Open Heart Surgery;  Laterality: N/A;   URETEROSCOPY WITH HOLMIUM LASER LITHOTRIPSY Bilateral 2004;  2005  dr Isabel Caprice  @[email or web address]    VASECTOMY       A IV Location/Drains/Wounds Patient Lines/Drains/Airways Status     Active Line/Drains/Airways     Name Placement date Placement time Site Days   Peripheral IV 02/26/23 20 G Anterior;Left;Proximal Forearm 02/26/23  --  Forearm  less than 1            Intake/Output Last 24 hours No intake or output data in the 24 hours ending 02/26/23 2128  Labs/Imaging Results for orders placed or performed during the hospital encounter of 02/26/23 (from the past 48 hour(s))  Basic metabolic panel     Status: Abnormal   Collection Time: 02/26/23  4:00 PM  Result Value Ref Range   Sodium 133 (L) 135 - 145 mmol/L   Potassium 4.7 3.5 - 5.1 mmol/L   Chloride 101 98 - 111 mmol/L   CO2 23 22 - 32 mmol/L   Glucose, Bld 352 (H) 70 - 99 mg/dL    Comment: Glucose reference  range applies only to samples taken after fasting for at least 8 hours.   BUN 34 (H) 8 - 23 mg/dL   Creatinine, Ser 1.61 (H) 0.61 - 1.24 mg/dL   Calcium 7.8 (L) 8.9 - 10.3 mg/dL   GFR, Estimated 35 (L) >60 mL/min    Comment: (NOTE) Calculated using the CKD-EPI Creatinine Equation (2021)    Anion gap 9 5 - 15    Comment: Performed at [facility], [address], [place], [name] [address]  CBC     Status: Abnormal   Collection Time: 02/26/23  4:00 PM  Result Value Ref Range   WBC 8.1 4.0 - 10.5 K/uL   RBC 4.18 (L) 4.22 - 5.81 MIL/uL   Hemoglobin 12.1 (L) 13.0 - 17.0 g/dL   HCT 54.0 (L) 98.1 - 19.1 %   MCV 93.1 80.0 - 100.0 fL   MCH 28.9 26.0 - 34.0 pg   MCHC 31.1 30.0 - 36.0 g/dL   RDW 47.8 (H) 29.5 - 62.1 %   Platelets 196 150 - 400 K/uL   nRBC 0.0 0.0 - 0.2 %    Comment: Performed at [facility] Lab, [address] [place], [name] [address]  Protime-INR     Status: Abnormal   Collection Time: 02/26/23  4:00 PM  Result Value Ref Range   Prothrombin Time  17.6 (H) 11.4 - 15.2 seconds   INR 1.4 (H) 0.8 - 1.2    Comment: (NOTE) INR goal varies based on device and disease states. Performed at [facility] Lab, [address], [place], [name] [address]   SARS Coronavirus 2 by RT PCR (hospital order, performed in [facility] hospital lab) *cepheid single result test* Anterior Nasal Swab     Status: Abnormal   Collection Time: 02/26/23  4:02 PM   Specimen: Anterior Nasal Swab  Result Value Ref Range   SARS Coronavirus 2 by RT PCR POSITIVE (A) NEGATIVE    Comment: Performed at [facility] Lab, [address], [place], [name] [address]  CBG monitoring, ED     Status: Abnormal   Collection Time: 02/26/23  4:11 PM  Result Value Ref Range   Glucose-Capillary 336 (H) 70 - 99 mg/dL    Comment: Glucose reference range applies only to samples taken after fasting for at least 8 hours.  Urinalysis, Routine w reflex microscopic -Urine, Clean Catch     Status: Abnormal   Collection Time: 02/26/23  7:49 PM  Result Value Ref Range   Color, Urine YELLOW YELLOW   APPearance CLEAR CLEAR   Specific Gravity, Urine >1.030 (H) 1.005 - 1.030   pH 6.0 5.0 - 8.0   Glucose, UA >=500 (A) NEGATIVE mg/dL   Hgb urine dipstick NEGATIVE NEGATIVE   Bilirubin Urine NEGATIVE NEGATIVE   Ketones, ur NEGATIVE NEGATIVE mg/dL   Protein, ur 841 (A) NEGATIVE mg/dL   Nitrite NEGATIVE NEGATIVE   Leukocytes,Ua NEGATIVE NEGATIVE    Comment: Performed at [facility] Lab, [address] [place], [name] [address]  Urinalysis, Microscopic (reflex)     Status: Abnormal   Collection Time: 02/26/23  7:49 PM  Result Value Ref Range   RBC / HPF NONE SEEN 0 - 5 RBC/hpf   WBC, UA 0-5 0 - 5 WBC/hpf   Bacteria, UA RARE (A) NONE SEEN   Squamous Epithelial / HPF 0-5 0 - 5 /HPF    Comment: Performed at [facility] Lab, [address] [place], [name]  [address]   DG Chest Portable 1 View  Result Date: 02/26/2023 CLINICAL DATA:  Weakness. Shortness of breath. Diarrhea. The  patient's wife was diagnosed with COVID yesterday. EXAM: PORTABLE CHEST 1 VIEW COMPARISON:  One view chest x-ray FINDINGS: Heart size is exaggerated by low lung volumes. The right hemidiaphragm is slightly elevated. No edema or effusion is present. No focal airspace disease is present. The visualized soft tissues and bony thorax are unremarkable. IMPRESSION: 1. Low lung volumes. 2. No acute cardiopulmonary disease. Electronically Signed   By: Marin Roberts M.D.   On: 02/26/2023 16:39    Pending Labs Unresulted Labs (From admission, onward)     Start     Ordered   02/27/23 0500  CBC with Differential/Platelet  Tomorrow morning,   R        02/26/23 2116   02/27/23 0500  Comprehensive metabolic panel  Tomorrow morning,   R        02/26/23 2116   02/27/23 0500  Magnesium  Tomorrow morning,   R        02/26/23 2116   02/27/23 0500  Phosphorus  Tomorrow morning,   R        02/26/23 2116   02/27/23 0500  C-reactive protein  Tomorrow morning,   R        02/26/23 2116   02/26/23 2122  Procalcitonin  Add-on,   AD       References:    Procalcitonin Lower Respiratory Tract Infection AND Sepsis Procalcitonin Algorithm   02/26/23 2121   02/26/23 2117  Magnesium  Add-on,   AD        02/26/23 2116   02/26/23 2117  C-reactive protein  Add-on,   AD        02/26/23 2116            Vitals/Pain Today's Vitals   02/26/23 1947 02/26/23 1949 02/26/23 2000 02/26/23 2030  BP:   (!) 143/91 (!) 142/97  Pulse:   86 79  Resp:   19 19  Temp:  98.7 F (37.1 C)    TempSrc:  Oral    SpO2:   100% 100%  Weight: 165 lb (74.8 kg)     Height: 5\' 6"  (1.676 m)       Isolation Precautions Airborne and Contact precautions  Medications Medications  acetaminophen (TYLENOL) tablet 650 mg (has no administration in time range)    Or  acetaminophen (TYLENOL) suppository 650 mg (has no administration in time range)  melatonin tablet 3 mg (has no administration in time range)  ondansetron (ZOFRAN) injection  4 mg (has no administration in time range)  benzonatate (TESSALON) capsule 100 mg (has no administration in time range)  linagliptin (TRADJENTA) tablet 5 mg (has no administration in time range)    Mobility walks     Focused Assessments Pulmonary Assessment Handoff:  Lung sounds:          R Recommendations: See Admitting Provider Note  Report given to:   Additional Notes:

## 2023-02-26 NOTE — ED Provider Notes (Signed)
[place] EMERGENCY DEPARTMENT AT [facility] Provider Note   CSN: [id number] Arrival date & time: 02/26/23  1551     History DM, CKD, HTN Chief Complaint  Patient presents with   Dehydration   Weakness    Fernando Hess is a 77 y.o. male.  77 y.o male with a PMH of HTN, DM, CKD on eliquis presents to the ED via EMS with a chief complaint of weakness x 3 days. Patient's wife tested positive for covid 19 yesterday, patient has had similar symptoms as his wife, he endorses generalized weakness, a productive cough along with increased work of breathing over the last couple of days.  He also reports waking up in the middle the night approximately 4 times in order to urinate, this is not regular for patient.  He does report feeling extremely weak, does report his lower legs feel weaker, he does have a prior history of myasthenia gravis.  Him and wife are concerned for dehydration.  Placed on 2L nasal cannula by EMS and later discontinued by me. Prior hx of CAD, not having any chest pain today, no fever, no abdominal pain. Denies diarrhea.   The history is provided by the patient and the EMS personnel.  Weakness Associated symptoms: dizziness and shortness of breath   Associated symptoms: no abdominal pain, no chest pain, no fever, no headaches, no nausea and no vomiting        Home Medications Prior to Admission medications   Medication Sig Start Date End Date Taking? Authorizing Provider  apixaban (ELIQUIS) 5 MG TABS tablet Take 1 tablet (5 mg total) by mouth 2 (two) times daily. 01/01/23   Lorin Glass, MD  atorvastatin (LIPITOR) 80 MG tablet TAKE ONE TABLET BY MOUTH EVERY EVENING Patient taking differently: Take 80 mg by mouth at bedtime. 11/13/22   Weaver, Lorin Picket T, [name]  B-D UF III MINI PEN NEEDLES 31G X 5 MM MISC USE AS DIRECTED WITH LANTUS SOLOSTAR 04/02/19   [provider]  budesonide (PULMICORT) 0.25 MG/2ML nebulizer solution Take 2 mLs (0.25 mg total) by  nebulization 2 (two) times daily. 01/01/23   Lorin Glass, MD  buPROPion (WELLBUTRIN XL) 150 MG 24 hr tablet Take 150 mg by mouth every morning. 12/10/22   [provider]  Continuous Blood Gluc Sensor (FREESTYLE LIBRE 2 SENSOR) MISC Inject 1 Device into the skin every 14 (fourteen) days.    [provider]  DULoxetine (CYMBALTA) 30 MG capsule Take 1 capsule (30 mg total) by mouth every morning. 01/13/23   Rhetta Mura, MD  feeding supplement, GLUCERNA SHAKE, (GLUCERNA SHAKE) LIQD Take 237 mLs by mouth 3 (three) times daily between meals. 01/01/23   Lorin Glass, MD  hydrALAZINE (APRESOLINE) 25 MG tablet Take 1 tablet (25 mg total) by mouth every 6 (six) hours as needed (SBP>160 or DBP>110). 01/12/23   Rhetta Mura, MD  insulin glargine-yfgn (SEMGLEE) 100 UNIT/ML injection Inject 0.3 mLs (30 Units total) into the skin daily. 02/08/23   Hughie Closs, MD  insulin lispro (HUMALOG) 100 UNIT/ML injection Inject 0-14 Units into the skin in the morning, at noon, in the evening, and at bedtime. 201-250= 2 units 251-300= 4 units, 301-350= 6 units, 351-400= 8 units, 401-450 = 10 units, 451-500= 12 units. If >500 or high, give 14 units.    [provider]  MAGNESIUM PO Take 400 mg by mouth daily.    [provider]  Multiple Vitamins-Minerals (SENIOR MULTIVITAMIN PLUS PO) Take 1 tablet by mouth  daily with breakfast.    [provider]  nitroGLYCERIN (NITROSTAT) 0.4 MG SL tablet Place 0.4 mg under the tongue every 5 (five) minutes as needed for chest pain.    [provider]  nystatin (MYCOSTATIN/NYSTOP) powder Apply 1 Application topically 2 (two) times daily as needed (skin irritation). Apply to the groin in the morning and evening    [provider]  OXYGEN Inhale 2 L into the lungs See admin instructions. Inhale 2 L every shift for shortness of breath    [provider]  pantoprazole (PROTONIX) 40 MG tablet TAKE 1 TABLET BY  MOUTH EVERY DAY Patient taking differently: Take 40 mg by mouth in the morning. 11/25/19   [name], MD  polyethylene glycol (MIRALAX / GLYCOLAX) 17 g packet Take 17 g by mouth 2 (two) times daily as needed for mild constipation. 01/09/23   Almon Hercules, MD  senna-docusate (SENOKOT-S) 8.6-50 MG tablet Take 1 tablet by mouth 2 (two) times daily as needed for moderate constipation. 01/09/23   Almon Hercules, MD  tamsulosin (FLOMAX) 0.4 MG CAPS capsule Take 1 capsule (0.4 mg total) by mouth daily after breakfast. Patient taking differently: Take 0.4 mg by mouth at bedtime. 01/02/23   Lorin Glass, MD      Allergies    Cilostazol, Dulaglutide, Levofloxacin, Liraglutide, and Lisinopril    Review of Systems   Review of Systems  Constitutional:  Negative for chills and fever.  HENT:  Negative for sore throat.   Respiratory:  Positive for shortness of breath.   Cardiovascular:  Negative for chest pain.  Gastrointestinal:  Negative for abdominal pain, nausea and vomiting.  Genitourinary:  Negative for flank pain.  Musculoskeletal:  Negative for back pain.  Skin:  Negative for pallor and wound.  Neurological:  Positive for dizziness and weakness. Negative for light-headedness and headaches.  All other systems reviewed and are negative.   Physical Exam Updated Vital Signs BP (!) 142/97   Pulse 79   Temp 98.7 F (37.1 C) (Oral)   Resp 19   Ht 5\' 6"  (1.676 m)   Wt 74.8 kg   SpO2 100%   BMI 26.63 kg/m  Physical Exam Vitals and nursing note reviewed.  Constitutional:      Appearance: Normal appearance.  HENT:     Head: Normocephalic and atraumatic.     Nose: Nose normal.     Mouth/Throat:     Mouth: Mucous membranes are dry.  Cardiovascular:     Rate and Rhythm: Normal rate.  Pulmonary:     Effort: Tachypnea present.     Breath sounds: Examination of the right-upper field reveals decreased breath sounds. Examination of the left-upper field reveals decreased breath sounds.  Examination of the right-lower field reveals decreased breath sounds. Examination of the left-lower field reveals decreased breath sounds. Decreased breath sounds present.     Comments: Lungs are diminished to auscultation at the bases.  He does have increased work of breathing with a respiratory rate in the upper 20s to 30s. Abdominal:     General: Abdomen is flat.     Palpations: Abdomen is soft.     Tenderness: There is no abdominal tenderness.  Musculoskeletal:     Cervical back: Normal range of motion and neck supple.  Skin:    General: Skin is warm and dry.  Neurological:     Mental Status: He is alert and oriented to person, place, and time.     Comments: Reports unable  to ambulate due to weakness Good leg raise to BLLE.  Sensation is intact.      ED Results / Procedures / Treatments   Labs (all labs ordered are listed, but only abnormal results are displayed) Labs Reviewed  SARS CORONAVIRUS 2 BY RT PCR - Abnormal; Notable for the following components:      Result Value   SARS Coronavirus 2 by RT PCR POSITIVE (*)    All other components within normal limits  BASIC METABOLIC PANEL - Abnormal; Notable for the following components:   Sodium 133 (*)    Glucose, Bld 352 (*)    BUN 34 (*)    Creatinine, Ser 1.91 (*)    Calcium 7.8 (*)    GFR, Estimated 35 (*)    All other components within normal limits  CBC - Abnormal; Notable for the following components:   RBC 4.18 (*)    Hemoglobin 12.1 (*)    HCT 38.9 (*)    RDW 15.7 (*)    All other components within normal limits  URINALYSIS, ROUTINE W REFLEX MICROSCOPIC - Abnormal; Notable for the following components:   Specific Gravity, Urine >1.030 (*)    Glucose, UA >=500 (*)    Protein, ur 100 (*)    All other components within normal limits  PROTIME-INR - Abnormal; Notable for the following components:   Prothrombin Time 17.6 (*)    INR 1.4 (*)    All other components within normal limits  URINALYSIS, MICROSCOPIC  (REFLEX) - Abnormal; Notable for the following components:   Bacteria, UA RARE (*)    All other components within normal limits  CBG MONITORING, ED - Abnormal; Notable for the following components:   Glucose-Capillary 336 (*)    All other components within normal limits  CBC WITH DIFFERENTIAL/PLATELET  COMPREHENSIVE METABOLIC PANEL  MAGNESIUM  MAGNESIUM  PHOSPHORUS  C-REACTIVE PROTEIN  C-REACTIVE PROTEIN  PROCALCITONIN    EKG EKG Interpretation  Date/Time:  Thursday February 26 2023 15:54:03 EDT Ventricular Rate:  80 PR Interval:    QRS Duration: 95 QT Interval:  414 QTC Calculation: 478 R Axis:   4 Text Interpretation: Atrial fibrillation Borderline prolonged QT interval No significant change since last tracing Confirmed by Gwyneth Sprout ([id number]) on 02/26/2023 4:40:31 PM  Radiology DG Chest Portable 1 View  Result Date: 02/26/2023 CLINICAL DATA:  Weakness. Shortness of breath. Diarrhea. The patient's wife was diagnosed with COVID yesterday. EXAM: PORTABLE CHEST 1 VIEW COMPARISON:  One view chest x-ray FINDINGS: Heart size is exaggerated by low lung volumes. The right hemidiaphragm is slightly elevated. No edema or effusion is present. No focal airspace disease is present. The visualized soft tissues and bony thorax are unremarkable. IMPRESSION: 1. Low lung volumes. 2. No acute cardiopulmonary disease. Electronically Signed   By: Marin Roberts M.D.   On: 02/26/2023 16:39    Procedures Procedures    Medications Ordered in ED Medications  acetaminophen (TYLENOL) tablet 650 mg (has no administration in time range)    Or  acetaminophen (TYLENOL) suppository 650 mg (has no administration in time range)  melatonin tablet 3 mg (has no administration in time range)  ondansetron (ZOFRAN) injection 4 mg (has no administration in time range)  benzonatate (TESSALON) capsule 100 mg (has no administration in time range)  linagliptin (TRADJENTA) tablet 5 mg (has no administration  in time range)    ED Course/ Medical Decision Making/ A&P Clinical Course as of 02/26/23 2130  Thu Feb 26, 2023  1939 SARS Coronavirus  2 by RT PCR(!): POSITIVE [JS]    Clinical Course User Index [JS] Claude Manges, [name]                             Medical Decision Making Amount and/or Complexity of Data Reviewed Labs: ordered. Decision-making details documented in ED Course. Radiology: ordered.  Risk Decision regarding hospitalization.   This patient presents to the ED for concern of weakness, dizziness, this involves a number of treatment options, and is a complaint that carries with it a high risk of complications and morbidity.  The differential diagnosis includes myasthenia flare up, infection process versus dehydration.     Co morbidities: Discussed in HPI   Brief History:  See HPI.   EMR reviewed including pt PMHx, past surgical history and past visits to ER.   See HPI for more details   Lab Tests:  I ordered and independently interpreted labs.  The pertinent results include:    CBC with no leukocytosis, hemoglobin stable.  BMP with an elevated creatinine however improved from prior.  PT/INR to his baseline.  UA without any nitrites or leukocytes to suggest superimposed infection.  CBG is 336 on arrival.  He is COVID-positive on today's visit.   Imaging Studies: Xray of his chest showed: IMPRESSION:  1. Low lung volumes.  2. No acute cardiopulmonary disease.   Cardiac Monitoring:  The patient was maintained on a cardiac monitor.  I personally viewed and interpreted the cardiac monitored which showed an underlying rhythm of: Irregularly irregular EKG non-ischemic   Medicines ordered:  N/A  Consults:  I requested consultation with Dr. Amada Jupiter  and discussed lab and imaging findings as well as pertinent plan - they recommend: evaluation while in the ED.   Reevaluation:  After the interventions noted above I re-evaluated patient and found that  they have :stayed the same   Social Determinants of Health:  The patient's social determinants of health were a factor in the care of this patient  Problem List / ED Course:  Patient here with a chief complaint of weakness along with upper respiratory symptoms that have been ongoing for the past 3 days.  Reports his wife tested positive for COVID-19 yesterday, he has had similar symptoms however he does have a history of myasthenia gravis and reports worsening lower extremity weakness.  He arrived to the ED with supplemental oxygen 2 L via nasal cannula, does not wear oxygen at home.  Unsure whether he was hypoxic previously, he was discontinue of O2 by me as he was maintaining his O2 saturation at 97%.  Reported of his blood no leukocytosis, hemoglobin is at his baseline.  BMP with an improved creatinine at 1.91, no electrolyte derangement aside from some mild hyponatremia.  UA without any nitrites or leukocytes.  Chest x-ray is clear on today's visit. Patient continues to complain of his low extremity weakness bilaterally, I did discuss with him obtaining neurology consultation.  I discussed this case with Dr. Amada Jupiter of neurology who will evaluate patient while in the ED.  Unable to rule out the fact that this is a myasthenia gravis exacerbation due to his superimposed COVID-19 infection. I spoke to hospitalist service Dr. Arlean Hopping, who will admit patient for further management.  Patient remains hemodynamically stable for admission. Dispostion:  After consideration of the diagnostic results and the patients response to treatment, I feel that the patent would benefit from admission for further management.  Portions of this  note were generated with Scientist, clinical (histocompatibility and immunogenetics). Dictation errors may occur despite best attempts at proofreading.   Final Clinical Impression(s) / ED Diagnoses Final diagnoses:  COVID-19 virus infection  Weakness    Rx / DC Orders ED Discharge Orders     None          Claude Manges, Cordelia Poche 02/26/23 2131    Gwyneth Sprout, MD 02/26/23 2334

## 2023-02-26 NOTE — ED Notes (Signed)
In and out cath performed, urine sent for UA.

## 2023-02-27 DIAGNOSIS — U071 COVID-19: Secondary | ICD-10-CM | POA: Diagnosis not present

## 2023-02-27 DIAGNOSIS — N189 Chronic kidney disease, unspecified: Secondary | ICD-10-CM

## 2023-02-27 DIAGNOSIS — N400 Enlarged prostate without lower urinary tract symptoms: Secondary | ICD-10-CM | POA: Diagnosis present

## 2023-02-27 DIAGNOSIS — R531 Weakness: Secondary | ICD-10-CM | POA: Diagnosis not present

## 2023-02-27 LAB — CBC WITH DIFFERENTIAL/PLATELET
Abs Immature Granulocytes: 0.12 10*3/uL — ABNORMAL HIGH (ref 0.00–0.07)
Basophils Absolute: 0 10*3/uL (ref 0.0–0.1)
Basophils Relative: 0 %
Eosinophils Absolute: 0 10*3/uL (ref 0.0–0.5)
Eosinophils Relative: 0 %
HCT: 38.9 % — ABNORMAL LOW (ref 39.0–52.0)
Hemoglobin: 12.5 g/dL — ABNORMAL LOW (ref 13.0–17.0)
Immature Granulocytes: 1 %
Lymphocytes Relative: 14 %
Lymphs Abs: 1.2 10*3/uL (ref 0.7–4.0)
MCH: 29 pg (ref 26.0–34.0)
MCHC: 32.1 g/dL (ref 30.0–36.0)
MCV: 90.3 fL (ref 80.0–100.0)
Monocytes Absolute: 0.8 10*3/uL (ref 0.1–1.0)
Monocytes Relative: 9 %
Neutro Abs: 6.6 10*3/uL (ref 1.7–7.7)
Neutrophils Relative %: 76 %
Platelets: 190 10*3/uL (ref 150–400)
RBC: 4.31 MIL/uL (ref 4.22–5.81)
RDW: 15.4 % (ref 11.5–15.5)
WBC: 8.7 10*3/uL (ref 4.0–10.5)
nRBC: 0 % (ref 0.0–0.2)

## 2023-02-27 LAB — COMPREHENSIVE METABOLIC PANEL
ALT: 42 U/L (ref 0–44)
AST: 32 U/L (ref 15–41)
Albumin: 2.7 g/dL — ABNORMAL LOW (ref 3.5–5.0)
Alkaline Phosphatase: 111 U/L (ref 38–126)
Anion gap: 8 (ref 5–15)
BUN: 30 mg/dL — ABNORMAL HIGH (ref 8–23)
CO2: 25 mmol/L (ref 22–32)
Calcium: 8.3 mg/dL — ABNORMAL LOW (ref 8.9–10.3)
Chloride: 103 mmol/L (ref 98–111)
Creatinine, Ser: 1.76 mg/dL — ABNORMAL HIGH (ref 0.61–1.24)
GFR, Estimated: 39 mL/min — ABNORMAL LOW (ref >60)
Glucose, Bld: 321 mg/dL — ABNORMAL HIGH (ref 70–99)
Potassium: 4.3 mmol/L (ref 3.5–5.1)
Sodium: 136 mmol/L (ref 135–145)
Total Bilirubin: 0.6 mg/dL (ref 0.3–1.2)
Total Protein: 5.3 g/dL — ABNORMAL LOW (ref 6.5–8.1)

## 2023-02-27 LAB — GLUCOSE, CAPILLARY
Glucose-Capillary: 419 mg/dL — ABNORMAL HIGH (ref 70–99)
Glucose-Capillary: 211 mg/dL — ABNORMAL HIGH (ref 70–99)
Glucose-Capillary: 244 mg/dL — ABNORMAL HIGH (ref 70–99)
Glucose-Capillary: 269 mg/dL — ABNORMAL HIGH (ref 70–99)
Glucose-Capillary: 389 mg/dL — ABNORMAL HIGH (ref 70–99)

## 2023-02-27 LAB — TSH: TSH: 0.135 u[IU]/mL — ABNORMAL LOW (ref 0.350–4.500)

## 2023-02-27 LAB — CK: Total CK: 45 U/L — ABNORMAL LOW (ref 49–397)

## 2023-02-27 LAB — PHOSPHORUS: Phosphorus: 3.6 mg/dL (ref 2.5–4.6)

## 2023-02-27 LAB — VITAMIN B12: Vitamin B-12: 2204 pg/mL — ABNORMAL HIGH (ref 180–914)

## 2023-02-27 LAB — C-REACTIVE PROTEIN: CRP: 1.2 mg/dL — ABNORMAL HIGH (ref <1.0)

## 2023-02-27 LAB — PROCALCITONIN: Procalcitonin: <0.1 ng/mL

## 2023-02-27 LAB — MAGNESIUM: Magnesium: 2 mg/dL (ref 1.7–2.4)

## 2023-02-27 LAB — BRAIN NATRIURETIC PEPTIDE: B Natriuretic Peptide: 114.4 pg/mL — ABNORMAL HIGH (ref 0.0–100.0)

## 2023-02-27 MED ORDER — VITAMIN C 500 MG PO TABS
500.0000 mg | ORAL_TABLET | Freq: Every day | ORAL | Status: DC
  Administered 2023-02-27 – 2023-03-01 (×3): 500 mg via ORAL
  Filled 2023-02-27 (×3): qty 1

## 2023-02-27 MED ORDER — ALBUTEROL SULFATE (2.5 MG/3ML) 0.083% IN NEBU: 2.5000 mg | INHALATION_SOLUTION | RESPIRATORY_TRACT | Status: DC | PRN

## 2023-02-27 MED ORDER — INSULIN ASPART 100 UNIT/ML IJ SOLN
15.0000 [IU] | Freq: Once | INTRAMUSCULAR | Status: AC
  Administered 2023-02-27: 15 [IU] via SUBCUTANEOUS

## 2023-02-27 MED ORDER — BUDESONIDE 0.25 MG/2ML IN SUSP
0.2500 mg | Freq: Two times a day (BID) | RESPIRATORY_TRACT | Status: DC
  Administered 2023-02-27 – 2023-03-01 (×4): 0.25 mg via RESPIRATORY_TRACT
  Filled 2023-02-27 (×4): qty 2

## 2023-02-27 MED ORDER — POLYETHYLENE GLYCOL 3350 17 G PO PACK: 17.0000 g | PACK | Freq: Two times a day (BID) | ORAL | Status: DC | PRN

## 2023-02-27 MED ORDER — BUPROPION HCL ER (XL) 150 MG PO TB24
150.0000 mg | ORAL_TABLET | Freq: Every morning | ORAL | Status: DC
  Administered 2023-02-27 – 2023-03-01 (×3): 150 mg via ORAL
  Filled 2023-02-27 (×3): qty 1

## 2023-02-27 MED ORDER — ALBUTEROL SULFATE (2.5 MG/3ML) 0.083% IN NEBU
2.5000 mg | INHALATION_SOLUTION | Freq: Three times a day (TID) | RESPIRATORY_TRACT | Status: DC
  Administered 2023-02-27: 2.5 mg via RESPIRATORY_TRACT
  Filled 2023-02-27: qty 3

## 2023-02-27 MED ORDER — PANTOPRAZOLE SODIUM 40 MG PO TBEC
40.0000 mg | DELAYED_RELEASE_TABLET | Freq: Every morning | ORAL | Status: DC
  Administered 2023-02-27 – 2023-03-01 (×3): 40 mg via ORAL
  Filled 2023-02-27 (×3): qty 1

## 2023-02-27 MED ORDER — ALBUTEROL SULFATE (2.5 MG/3ML) 0.083% IN NEBU: 2.5000 mg | INHALATION_SOLUTION | Freq: Three times a day (TID) | RESPIRATORY_TRACT | Status: DC

## 2023-02-27 MED ORDER — GUAIFENESIN ER 600 MG PO TB12
600.0000 mg | ORAL_TABLET | Freq: Two times a day (BID) | ORAL | Status: DC
  Administered 2023-02-27 – 2023-03-01 (×5): 600 mg via ORAL
  Filled 2023-02-27 (×5): qty 1

## 2023-02-27 MED ORDER — INSULIN GLARGINE-YFGN 100 UNIT/ML ~~LOC~~ SOLN
25.0000 [IU] | Freq: Every day | SUBCUTANEOUS | Status: DC
  Administered 2023-02-27: 25 [IU] via SUBCUTANEOUS
  Filled 2023-02-27 (×2): qty 0.25

## 2023-02-27 NOTE — Progress Notes (Signed)
PROGRESS NOTE    Fernando Hess  ZOX:[id number] DOB: [birth year] DOA: 02/26/2023 PCP: [name], MD   Brief Narrative: 77 year old with past medical history significant for myasthenia gravis, chronic diastolic heart failure, paroxysmal A-fib on Eliquis, CKD 3B creatinine baseline 1.7--2.3, diabetes type 2 presents 6/13 complaining of cough, rhinorrhea symptoms started 6/10 patient was diagnosed with COVID.  His wife was diagnosed with COVID first.  He also reports 3 days of generalized weakness, symmetrical weakness in bilateral lower extremity.  Patient was diagnosed with COVID, found to have also hyperglycemia, and generalized weakness.    Assessment & Plan:   Principal Problem:   COVID-19 virus infection Active Problems:   Paroxysmal atrial fibrillation ([name])   Chronic diastolic CHF (congestive heart failure) ([name])   Generalized weakness   CKD stage 3b, GFR 30-44 ml/min ([name])   Hyperlipidemia   DM2 (diabetes mellitus, type 2) ([name])   BPH (benign prostatic hyperplasia)   History of anemia due to chronic kidney disease  1-COVID-19 infection: Patient presents with cough, rhinitis for the last 4 days.  COVID PCR positive. -Monitor for hypoxia -Report improvement of symptoms.  -Contraindication to Paxlovid.  -no chest x ray finding or hypoxia.  -Support care.   2-generalized weakness: B12: ,2200 CPK 45 , TSH: 0.135 low. Will check free T 3 and T 4.  Neurology consulted Plan to monitor overnight, symptoms related to Covid.  NIF -24, FVC 1.1L.   Chronic diastolic heart failure: Compensated. Not on diuretics at home.  Monitor,.   Paroxysmal A-fib: Continue with Eliquis  CKD stage IIIb: Baseline Cr 1.7--2.3 Stable. Monitor.   Hyperlipidemia: Holding statin for now due to weakness.   Diabetes type 2: Increase Semglee to 25 units.  Started Tradjenta.   BPH: On Flmax  Anemia of chronic kidney disease: Monitor hb,    HTN; uncontrolled. He has PRN  hydralazine. Nurse will give hydralazine.        Estimated body mass index is 26.63 kg/m as calculated from the following:   Height as of this encounter: 5\' 6"  (1.676 m).   Weight as of this encounter: 74.8 kg.   DVT prophylaxis: Eliquis Code Status: Full code Family Communication: Care discussed with patient Disposition Plan:  Status is: Inpatient Remains inpatient appropriate because: management of Covid, weakness. Plan to discharge tomorrow if NIF stable.     Consultants:  Neurology   Procedures:    Antimicrobials:    Subjective: He is alert conversant, feel better, cough improved, weakness improved.    Objective: Vitals:   02/26/23 2234 02/26/23 2349 02/27/23 0523 02/27/23 0733  BP: (!) 188/93 132/86 (!) 173/94 (!) 179/99  Pulse: 80 93 80 80  Resp: (!) 22 15 16    Temp: 98.3 F (36.8 C)  98.1 F (36.7 C) 98.2 F (36.8 C)  TempSrc:      SpO2: 95% 98% 99% 100%  Weight:      Height:       No intake or output data in the 24 hours ending 02/27/23 0747 Filed Weights   02/26/23 1947  Weight: 74.8 kg    Examination:  General exam: Appears calm and comfortable  Respiratory system: Clear to auscultation. Respiratory effort normal. Cardiovascular system: S1 & S2 heard, RRR. No JVD, murmurs, rubs, gallops or clicks. No pedal edema. Gastrointestinal system: Abdomen is nondistended, soft and nontender. No organomegaly or masses felt. Normal bowel sounds heard. Central nervous system: Alert and oriented. No focal neurological deficits. Extremities: Symmetric 5 x 5 power.  Data Reviewed: I have personally reviewed following labs and imaging studies  CBC: Recent Labs  Lab 02/26/23 1600 02/27/23 0522  WBC 8.1 8.7  NEUTROABS  --  6.6  HGB 12.1* 12.5*  HCT 38.9* 38.9*  MCV 93.1 90.3  PLT 196 190   Basic Metabolic Panel: Recent Labs  Lab 02/26/23 1600 02/26/23 2242 02/27/23 0522  NA 133*  --  136  K 4.7  --  4.3  CL 101  --  103  CO2 23  --  25   GLUCOSE 352*  --  321*  BUN 34*  --  30*  CREATININE 1.91*  --  1.76*  CALCIUM 7.8*  --  8.3*  MG  --  2.0 2.0  PHOS  --   --  3.6   GFR: Estimated Creatinine Clearance: 31.2 mL/min (A) (by C-G formula based on SCr of 1.76 mg/dL (H)). Liver Function Tests: Recent Labs  Lab 02/27/23 0522  AST 32  ALT 42  ALKPHOS 111  BILITOT 0.6  PROT 5.3*  ALBUMIN 2.7*   No results for input(s): "LIPASE", "AMYLASE" in the last 168 hours. No results for input(s): "AMMONIA" in the last 168 hours. Coagulation Profile: Recent Labs  Lab 02/26/23 1600  INR 1.4*   Cardiac Enzymes: Recent Labs  Lab 02/27/23 0522  CKTOTAL 45*   BNP (last 3 results) No results for input(s): "PROBNP" in the last 8760 hours. HbA1C: No results for input(s): "HGBA1C" in the last 72 hours. CBG: Recent Labs  Lab 02/26/23 1611 02/26/23 2204 02/27/23 0135 02/27/23 0743  GLUCAP 336* 429* 419* 211*   Lipid Profile: No results for input(s): "CHOL", "HDL", "LDLCALC", "TRIG", "CHOLHDL", "LDLDIRECT" in the last 72 hours. Thyroid Function Tests: Recent Labs    02/27/23 0522  TSH 0.135*   Anemia Panel: Recent Labs    02/27/23 0522  VITAMINB12 2,204*   Sepsis Labs: Recent Labs  Lab 02/26/23 2242  PROCALCITON <0.10    Recent Results (from the past 240 hour(s))  SARS Coronavirus 2 by RT PCR (hospital order, performed in [facility] hospital lab) *cepheid single result test* Anterior Nasal Swab     Status: Abnormal   Collection Time: 02/26/23  4:02 PM   Specimen: Anterior Nasal Swab  Result Value Ref Range Status   SARS Coronavirus 2 by RT PCR POSITIVE (A) NEGATIVE Final    Comment: Performed at [facility], [address], [place], [name] [address]         Radiology Studies: DG Chest Portable 1 View  Result Date: 02/26/2023 CLINICAL DATA:  Weakness. Shortness of breath. Diarrhea. The patient's wife was diagnosed with COVID yesterday. EXAM: PORTABLE CHEST 1 VIEW COMPARISON:  One view  chest x-ray FINDINGS: Heart size is exaggerated by low lung volumes. The right hemidiaphragm is slightly elevated. No edema or effusion is present. No focal airspace disease is present. The visualized soft tissues and bony thorax are unremarkable. IMPRESSION: 1. Low lung volumes. 2. No acute cardiopulmonary disease. Electronically Signed   By: Marin Roberts M.D.   On: 02/26/2023 16:39        Scheduled Meds:  apixaban  5 mg Oral BID   budesonide  0.25 mg Nebulization BID   buPROPion  150 mg Oral q morning   insulin aspart  0-5 Units Subcutaneous QHS   insulin aspart  0-9 Units Subcutaneous TID WC   insulin glargine-yfgn  18 Units Subcutaneous QHS   linagliptin  5 mg Oral Daily   pantoprazole  40 mg Oral q AM   tamsulosin  0.4 mg Oral QHS   Continuous Infusions:   LOS: 1 day    Time spent: 35 minutes    Kolyn Rozario A Linzy Darling, MD [facility]   If 7PM-7AM, please contact night-coverage [email or web address]  02/27/2023, 7:47 AM

## 2023-02-27 NOTE — Consult Note (Signed)
Neurology Consultation Reason for Consult: Shortness of breath Referring Physician: Merwyn Katos  CC: Shortness of breath  History is obtained from: Patient  HPI: Fernando Hess is a 77 y.o. male with a history of a diagnosis of myasthenia gravis who presents with shortness of breath.  He states that he has been short of breath and generally weak.  When asked what myasthenia flares typically mean to him, he states that he gets unsteady, and has difficulty walking.  He is currently managed by neurologist in [place], Dr. Iver Nestle summarized his previous workup as follows in a previous note:  "myasthenia gravis workup is included antibody testing for acetylcholine receptor antibody, musk, LRP 4 all negative with a negative CT chest.  Exacerbations have felt to be related to shortness of breath and difficulty breathing and swallowing but the patient was noted to have failed Mestinon, prednisone and IVIG with plan to start Ultomiris (Ravulizumab, and anti-C5 complement system antibody).  Neurological examination 07/30/2022 was notable for normal mental status, patchy weakness, ataxic gait, facial diplegia, peripheral neuropathy, and hypoactive reflexes with 1+ jaw jerk.  Notes confirm the gabapentin was added for neuropathic pain (lumbosacral radiculopathy), and trigger point injections for acute neck pain"   Past Medical History:  Diagnosis Date   Acute renal failure (ARF) ([name]) 09/24/2015   Acute stress disorder 11/13/2021   Atrial premature complexes    CAD (coronary artery disease) of artery bypass graft    Early occlusion of saphenous vein graft to intermediate and marginal branch in February 2007 following bypass grafting    CAD (coronary artery disease), native coronary artery 2017   hx NSTEMI 09-24-2015  s/p  CABG x5 on 10-02-2015;  post op STEMI inferolateral wall,  SVG OM1 and SVG OM2 occluded, distal OM occlusion the calpruit, treated medically // Myoview 7/21: no ischemia, EF 65, low  risk   CHF (congestive heart failure) ([name])    CKD (chronic kidney disease), stage III ([name])    Contusion of right knee 03/16/2020   Dyspnea    Elevated troponin    Erectile dysfunction    Esophageal reflux    History of atrial fibrillation    post op CABG 10-02-2015   History of kidney stones    History of non-ST elevation myocardial infarction (NSTEMI) 09/24/2015   s/p  CABG x5   History of ST elevation myocardial infarction (STEMI) 10/22/2015   inferior wall,  post op CABG 10-02-2015   Hyperlipidemia    Hypertension    Left ureteral stone    Mild atherosclerosis of both carotid arteries    Nephrolithiasis    per CT bilateral non-obstructive calculi   OSA (obstructive sleep apnea)    Peripheral artery disease    LE Arterial US 01/2019: R PTA and ATA occluded; L ATA occluded   RBBB (right bundle branch block)    Renal atrophy, right    Sleep apnea    wears cpap    ST elevation myocardial infarction (STEMI) of inferior wall ([name]) 10/22/2015   Type 2 diabetes mellitus treated with insulin ([name])    followed by pcp   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 He has never used smokeless tobacco. He reports that he does not currently use alcohol. He reports that he does not use drugs.   Exam: Current vital signs: BP (!) 173/94 (BP Location: Right Arm)   Pulse 80   Temp 98.1 F (36.7 C)   Resp 16   Ht 5\' 6"  (1.676 m)   Wt 74.8 kg   SpO2 99%   BMI 26.63 kg/m  Vital signs in last 24 hours: Temp:   [98.1 F (36.7 C)-98.7 F (37.1 C)] 98.1 F (36.7 C) (06/14 0523) Pulse Rate:  [79-93] 80 (06/14 0523) Resp:  [15-25] 16 (06/14 0523) BP: (124-188)/(74-98) 173/94 (06/14 0523) SpO2:  [95 %-100 %] 99 % (06/14 0523) Weight:  [74.8 kg] 74.8 kg (06/13 1947)   Physical Exam  Appears well-developed and well-nourished.   Neuro: Mental Status: Patient is awake, alert, oriented to person, place, month, year, and situation. Patient is able to give a clear and coherent history. No signs of aphasia or neglect Cranial Nerves: II: Visual Fields are full. Pupils are equal, round, and reactive to light.   III,IV, VI: EOMI without ptosis or diploplia.  V: Facial sensation is symmetric to temperature VII: Facial movement is symmetric.  VIII: hearing is intact to voice X: Uvula elevates symmetrically XI: Shoulder shrug is symmetric. XII: tongue is midline without atrophy or fasciculations.  Motor: Tone is normal. Bulk is normal. 5/5 strength was present in bilateral arms, he has left foot drop with impaired eversion as well, good knee flexion and knee extension bilaterally. He has 5/5 neck flexion Sensory: Sensation is symmetric to light touch and temperature in the arms and legs. Cerebellar: No clear ataxia   I have reviewed labs in epic and the results pertinent to this consultation are: CK 45 Creatinine 1.76, appears close to baseline  Impression: 77 year old male with generalized weakness in the setting of COVID infection.  He has been extensively worked up for myasthenia, and the results of which I can see have been negative.  I am not sure how the diagnosis was made, presumably with EMG.  The fact that he has not responded to any of his previous therapies of myasthenia, I do think because diagnosis and question to some degree.  He does not have any neck flexion weakness which is typical if someone is developing respiratory failure from myasthenia, and no other stigmata suggestive of  myasthenic exacerbation either.  At this time, my suspicion for a myasthenic exacerbation is relatively low, though I would follow nifs and vital capacities.  If these are low or borderline, then we can continue to follow, but if they remain stable then we will follow from afar.  Recommendations: 1) negative inspiratory force and vital capacity 2) I encouraged him to follow-up with a neuromuscular specialist after discharge, he has been referred to [facility] neurology. 3) treatment of COVID per internal medicine   Ritta Slot, MD [facility] [phone]  If 7pm- 7am, please page neurology on call as listed in AMION.

## 2023-02-27 NOTE — Progress Notes (Deleted)
Patient performed NIF -30 and FVC 1.1L with good effort.

## 2023-02-27 NOTE — Progress Notes (Signed)
Neurology Progress Note  Brief HPI: 77 year old patient with history of CHF, A-fib on Eliquis, CKD stage IIIb, hyperlipidemia, diabetes and possible myasthenia gravis was admitted with cough and found to have COVID-19 infection.  He has complained of shortness of breath and generalized weakness.  His myasthenia gravis diagnosis is questionable given the antibody testing has all been negative and patient has not responded to any standard therapies for MG.  He was referred to an outpatient neurologist for second opinion on this diagnosis but has not had his appointment yet.  Subjective: Patient reports some shortness of breath and generalized weakness  Exam: Vitals:   02/27/23 0523 02/27/23 0733  BP: (!) 173/94 (!) 179/99  Pulse: 80 80  Resp: 16   Temp: 98.1 F (36.7 C) 98.2 F (36.8 C)  SpO2: 99% 100%   Gen: In bed, NAD Resp: non-labored breathing, no acute distress  Neuro: Mental Status: Alert and oriented to person place time and situation Cranial Nerves: Pupils equal round reactive to light, extraocular movements intact, facial sensation symmetrical, face symmetrical, hearing intact to voice, phonation normal, shoulder shrug symmetrical, tongue midline Motor: 5/5 neck flexion and extension, 5/5 strength in bilateral upper and lower extremities, proximal and distal Sensory: Intact to light touch except in right lower leg where he has chronic numbness after surgery Gait: Deferred  Able to count to 12 on a single breath, no diplopia with sustained upgaze  Pertinent Labs:    Latest Ref Rng & Units 02/27/2023    5:22 AM 02/26/2023    4:00 PM 02/08/2023    9:17 PM  CBC  WBC 4.0 - 10.5 K/uL 8.7  8.1  10.7   Hemoglobin 13.0 - 17.0 g/dL 16.1  09.6  04.5   Hematocrit 39.0 - 52.0 % 38.9  38.9  35.4   Platelets 150 - 400 K/uL 190  196  244        Latest Ref Rng & Units 02/27/2023    5:22 AM 02/26/2023    4:00 PM 02/08/2023    9:17 PM  BMP  Glucose 70 - 99 mg/dL 409  811  914   BUN 8  - 23 mg/dL 30  34  30   Creatinine 0.61 - 1.24 mg/dL 7.82  9.56  2.13   Sodium 135 - 145 mmol/L 136  133  139   Potassium 3.5 - 5.1 mmol/L 4.3  4.7  4.1   Chloride 98 - 111 mmol/L 103  101  106   CO2 22 - 32 mmol/L 25  23  21    Calcium 8.9 - 10.3 mg/dL 8.3  7.8  8.4      Imaging Reviewed:  No relevant imaging this admission  Assessment: 77 year old patient with possible myasthenia gravis presents with shortness of breath and generalized weakness along with COVID infection.  He has diagnosis of myasthenia gravis is in doubt as antibody testing has been negative and patient has not responded to standard therapies for MG.  He is awaiting a second opinion with an outpatient neurologist regarding this diagnosis.  On exam, he has no diplopia with sustained upgaze and good strength in bilateral upper and lower extremities as well as neck flexion and extension.  He is only able to count to 12 on a single breath, but suspect that respiratory difficulties are more due to COVID infection than anything else.  Still would be prudent to follow NIF and vital capacity.  Impression: Shortness of breath, likely due to COVID infection in patient with questionable  myasthenia gravis diagnosis  Recommendations: 1) NIF and vital capacity every 12 hours. Is stable for the next day, can space them out to every 24 hours. 2) treatment of COVID per primary team 3) follow-up with outpatient neurologist at [facility] or if the patient would like, can follow up with Dr. Nita Sickle with [facility] for EMG/NCS with high rep stim for further eval of ?Myasthenia. We will signoff.  Cortney E Ernestina Columbia , MSN, AGACNP-BC [facility] See Amion for schedule and pager information 02/27/2023 9:08 AM    NEUROHOSPITALIST ADDENDUM Performed a face to face diagnostic evaluation.   I have reviewed the contents of history and physical exam as documented by PA/ARNP/Resident and agree with above documentation.  I have  discussed and formulated the above plan as documented. Edits to the note have been made as needed.  Erick Blinks, MD [facility] [phone]   If 7pm to 7am, please call on call as listed on AMION.

## 2023-02-27 NOTE — Care Management Obs Status (Signed)
MEDICARE OBSERVATION STATUS NOTIFICATION   Patient Details  Name: Fernando Hess MRN: [id number] Date of Birth: 1946/08/19   Medicare Observation Status Notification Given:  Yes    Harriet Masson, RN 02/27/2023, 3:28 PM

## 2023-02-27 NOTE — Evaluation (Signed)
Physical Therapy Evaluation Patient Details Name: Fernando Hess MRN: [id number] DOB: 06/18/1946 Today's Date: 02/27/2023  History of Present Illness  77 yo male presents to [facility] on 6/13 with SOB and weakness secondary to covid-19. Recent admission 5/20 with fall, AKI. 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  Clinical Impression   Pt presents with generalized weakness, impaired standing balance, limited activity tolerance, and poor safety awareness. Pt to benefit from acute PT to address deficits. Pt tolerated step pivot transfer only this session given fatigue and dyspnea (SPO2 98% on RA), pt overall requiring light assist for mobility. PT discussed d/c plan with pt, would benefit from short-term low-intensity rehabilitation but pt declines, states he wants to d/c home. Pt endorses needing new W/c, as the one he has was his mother's and is not functional. PT to progress mobility as tolerated, and will continue to follow acutely.         Recommendations for follow up therapy are one component of a multi-disciplinary discharge planning process, led by the attending physician.  Recommendations may be updated based on patient status, additional functional criteria and insurance authorization.  Follow Up Recommendations Can patient physically be transported by private vehicle: Yes     Assistance Recommended at Discharge Intermittent Supervision/Assistance  Patient can return home with the following  A little help with walking and/or transfers;A little help with bathing/dressing/bathroom;Assistance with cooking/housework;Assist for transportation;Help with stairs or ramp for entrance    Equipment Recommendations Wheelchair cushion (measurements PT);Wheelchair (measurements PT)  Recommendations for Other Services       Functional Status Assessment Patient has had a recent decline in their  functional status and demonstrates the ability to make significant improvements in function in a reasonable and predictable amount of time.     Precautions / Restrictions Precautions Precautions: Fall Restrictions Weight Bearing Restrictions: No      Mobility  Bed Mobility Overal bed mobility: Needs Assistance Bed Mobility: Supine to Sit     Supine to sit: Supervision, HOB elevated     General bed mobility comments: use of bedrails    Transfers Overall transfer level: Needs assistance Equipment used: Rolling walker (2 wheels) Transfers: Sit to/from Stand Sit to Stand: Min assist   Step pivot transfers: Min assist       General transfer comment: assist for rise, steady, pivot to recliner, and slow eccentric lower into recliner.    Ambulation/Gait               General Gait Details: nt - pt declines due to fatigue  Stairs            Wheelchair Mobility    Modified Rankin (Stroke Patients Only)       Balance Overall balance assessment: Needs assistance, History of Falls Sitting-balance support: No upper extremity supported, Feet supported Sitting balance-Leahy Scale: Fair     Standing balance support: Bilateral upper extremity supported, During functional activity, Reliant on assistive device for balance Standing balance-Leahy Scale: Poor Standing balance comment: reliant on AD and PT assist                             Pertinent Vitals/Pain Pain Assessment Pain Assessment: No/denies pain    Home Living Family/patient expects to be discharged to:: Private residence Living Arrangements: Spouse/significant other Available Help at Discharge: Family;Available 24 hours/day (wife unable to provide physical assist)  Type of Home: House Home Access: Ramped entrance       Home Layout: Two level;Able to live on main level with bedroom/bathroom (does report pt's spouse may be buying a stair lift) Home Equipment: Rollator (4  wheels);Shower seat;Grab bars - tub/shower;BSC/3in1;Wheelchair - manual Additional Comments: Has aide that comes 3 days/week for 3 hours to assist pt and his wife with ADL/IADLs    Prior Function Prior Level of Function : Needs assist;Patient poor historian/Family not available;History of Falls (last six months)             Mobility Comments: ambulatory with rollator for short household distances. history of frequent falls ADLs Comments: Pt reported independence with ADLs at start of session, then reports use of aide for bathing/dressing. Pt questionable historian.     Hand Dominance   Dominant Hand: Right    Extremity/Trunk Assessment   Upper Extremity Assessment Upper Extremity Assessment: Defer to OT evaluation    Lower Extremity Assessment Lower Extremity Assessment: Generalized weakness    Cervical / Trunk Assessment Cervical / Trunk Assessment: Kyphotic  Communication   Communication: No difficulties  Cognition Arousal/Alertness: Awake/alert Behavior During Therapy: WFL for tasks assessed/performed Overall Cognitive Status: Impaired/Different from baseline Area of Impairment: Safety/judgement, Problem solving, Attention, Following commands                   Current Attention Level: Selective   Following Commands: Follows one step commands with increased time Safety/Judgement: Decreased awareness of deficits, Decreased awareness of safety   Problem Solving: Slow processing, Difficulty sequencing, Requires verbal cues, Requires tactile cues General Comments: tangential in conversation, hard to keep on task at hand. Perseverative on health conditions        General Comments General comments (skin integrity, edema, etc.): SpO2 98 and above on RA, DOE 2/4    Exercises     Assessment/Plan    PT Assessment Patient needs continued PT services  PT Problem List Decreased strength;Decreased activity tolerance;Decreased balance;Decreased mobility;Decreased  knowledge of use of DME;Cardiopulmonary status limiting activity;Decreased safety awareness       PT Treatment Interventions Therapeutic exercise;Balance training;Neuromuscular re-education;Cognitive remediation;Patient/family education;Gait training;Therapeutic activities;Functional mobility training    PT Goals (Current goals can be found in the Care Plan section)  Acute Rehab PT Goals Patient Stated Goal: go home, get stronger PT Goal Formulation: With patient Time For Goal Achievement: 03/13/23 Potential to Achieve Goals: Good    Frequency Min 3X/week     Co-evaluation               AM-PAC PT "6 Clicks" Mobility  Outcome Measure Help needed turning from your back to your side while in a flat bed without using bedrails?: A Little Help needed moving from lying on your back to sitting on the side of a flat bed without using bedrails?: A Little Help needed moving to and from a bed to a chair (including a wheelchair)?: A Little Help needed standing up from a chair using your arms (e.g., wheelchair or bedside chair)?: A Little Help needed to walk in hospital room?: A Little Help needed climbing 3-5 steps with a railing? : A Lot 6 Click Score: 17    End of Session   Activity Tolerance: Patient limited by fatigue Patient left: in chair;with call bell/phone within reach;with chair alarm set;Other (comment) (PT provided pt with hill rom remote for nurse call and telephone use, needs a call bell in room as there is not one present, NT notified and ordering one)  Nurse Communication: Mobility status PT Visit Diagnosis: Other abnormalities of gait and mobility (R26.89);Muscle weakness (generalized) (M62.81);Other (comment)    Time: 1610-9604 PT Time Calculation (min) (ACUTE ONLY): 21 min   Charges:   PT Evaluation $PT Eval Low Complexity: 1 Low          Nimesh Riolo S, PT DPT Acute Rehabilitation Services Secure Chat Preferred  Office [phone]   Cleotilde Spadaccini Sheliah Plane 02/27/2023,  10:39 AM

## 2023-02-27 NOTE — Progress Notes (Signed)
Pt sleeping. Will obtain parameters in AM and attempt again early evening.

## 2023-02-27 NOTE — Discharge Instructions (Signed)

## 2023-02-27 NOTE — Inpatient Diabetes Management (Signed)
Inpatient Diabetes Program Recommendations  AACE/ADA: New Consensus Statement on Inpatient Glycemic Control (2015)  Target Ranges:  Prepandial:   less than 140 mg/dL      Peak postprandial:   less than 180 mg/dL (1-2 hours)      Critically ill patients:  140 - 180 mg/dL   Lab Results  Component Value Date   GLUCAP 244 (H) 02/27/2023   HGBA1C 10.9 (H) 02/05/2023    Review of Glycemic Control  Latest Reference Range & Units 02/27/23 01:35 02/27/23 07:43 02/27/23 12:37  Glucose-Capillary 70 - 99 mg/dL 161 (H) 096 (H) 045 (H)  (H): Data is abnormally high Diabetes history: Type 2 DM Outpatient Diabetes medications: Semglee 30 units every day, Humalog 0-14 units TID Current orders for Inpatient glycemic control: Novolog 0-9 units TID & HS, Semglee 18 units QHS, Tradjenta 5 mg QD  Inpatient Diabetes Program Recommendations:    Consider increasing Semglee to 25 units at bedtime.  Thanks, Lujean Rave, MSN, RNC-OB Diabetes Coordinator [phone] (8a-5p)

## 2023-02-27 NOTE — Care Management CC44 (Signed)
Condition Code 44 Documentation Completed  Patient Details  Name: Fernando Hess MRN: [id number] Date of Birth: 11/26/1945   Condition Code 44 given:  Yes Patient signature on Condition Code 44 notice:  Yes Documentation of 2 MD's agreement:  Yes Code 44 added to claim:  Yes    Harriet Masson, RN 02/27/2023, 3:28 PM

## 2023-02-27 NOTE — TOC Initial Note (Addendum)
Transition of Care [facility]) - Initial/Assessment Note    Patient Details  Name: Fernando Hess MRN: [id number] Date of Birth: [birth year]  Transition of Care [facility]) CM/SW Contact:    Evelette Hollern A [place], Theresia Majors Phone Number: 02/27/2023, 4:21 PM  Clinical Narrative:                  CSW informed pt that provider did not think it was a good idea for pt to have TB skin test while he is medically compromised.   When CSW suggested SNF workup, Pt's wife states that she does not want pt  to  go back to skilled nursing because pt has used 100 days and has not been out of the hospital or SNF for more than 60 days so they would be paying out of pocket for those services.  She says she wants pt to return home on Tuesday if at all possible. CSW informed pt's wife that pt was observation status and pt would be responsible for some of his stay. She states that she understood that and would do whatever it takes for her husband to remain well.She says that she does not have another place for her husband to go and cannot take care of him at home. CSW to inform nursing case manager and CSW to assist with discharge plan, home versus assisted living.   TOC will continue to follow.   CSW contacted pt's wife, Nickolous Somogyi to complete assessment.  She stated that pt came into the hospital due to a fall and also contracted Covid.   She stated that she and pt have caregivers but the care givers cannot provide services until next Tuesday because pt needs 5 days of treatment before they can return to the home.   She also said that pt receives services from [facility] 2/week for PT and they have personal care services through [facility] 3/x week.   She also said that she would be unable to provide transportation to pt until Tuesday.   She also requested a [employer] if possible at discharge.   Pt's wife is requesting pt go to [facility] for assisted living for pt to have care services. She provided contact information  Alanda Amass, [phone] is the contact person for facility.   TOC will continue to follow.    Expected Discharge Plan: Skilled Nursing Facility Barriers to Discharge: Continued Medical Work up   Patient Goals and CMS Choice Patient states their goals for this hospitalization and ongoing recovery are:: get help at the facility          Expected Discharge Plan and Services In-house Referral: Clinical Social Work     Living arrangements for the past 2 months: Single Family Home                                      Prior Living Arrangements/Services Living arrangements for the past 2 months: Single Family Home Lives with:: Spouse              Current home services: Home PT, Homehealth aide ([facility] home health aid. [facility]-PT)    Activities of Daily Living Home Assistive Devices/Equipment: Eyeglasses, Dan Humphreys (specify type) ADL Screening (condition at time of admission) Patient's cognitive ability adequate to safely complete daily activities?: Yes Is the patient deaf or have difficulty hearing?: No Does the patient have difficulty seeing, even when wearing glasses/contacts?: Yes Does  the patient have difficulty concentrating, remembering, or making decisions?: No Patient able to express need for assistance with ADLs?: Yes Does the patient have difficulty dressing or bathing?: Yes Independently performs ADLs?: No Communication: Independent Dressing (OT): Dependent Is this a change from baseline?: Pre-admission baseline Grooming: Needs assistance Is this a change from baseline?: Pre-admission baseline Feeding: Independent Bathing: Dependent Is this a change from baseline?: Pre-admission baseline Toileting: Dependent Is this a change from baseline?: Pre-admission baseline In/Out Bed: Dependent Is this a change from baseline?: Pre-admission baseline Walks in Home: Dependent Is this a change from baseline?: Pre-admission baseline Does the patient have  difficulty walking or climbing stairs?: Yes Weakness of Legs: Both Weakness of Arms/Hands: None  Permission Sought/Granted                  Emotional Assessment Appearance:: Appears older than stated age Attitude/Demeanor/Rapport: Unable to Assess Affect (typically observed): Unable to Assess Orientation: : Oriented to Self, Oriented to Place, Oriented to  Time, Oriented to Situation Alcohol / Substance Use: Not Applicable Psych Involvement: No (comment)  Admission diagnosis:  Weakness [R53.1] COVID-19 virus infection [U07.1] COVID-19 [U07.1] Patient Active Problem List   Diagnosis Date Noted   BPH (benign prostatic hyperplasia) 02/27/2023   History of anemia due to chronic kidney disease 02/27/2023   COVID-19 02/27/2023   COVID-19 virus infection 02/26/2023   Acute kidney injury superimposed on chronic kidney disease ([name]) 02/03/2023   Transient hypotension 02/03/2023   SIRS (systemic inflammatory response syndrome) ([name]) 02/03/2023   Dementia without behavioral disturbance ([name]) 02/03/2023   Fall at home, initial encounter 02/03/2023   Acute respiratory failure with hypoxia ([name]) 02/03/2023   Hypokalemia 02/03/2023   Prolonged QT interval 02/03/2023   Left ankle pain 01/08/2023   Hematuria 01/08/2023   Fracture of femoral neck, right, closed ([name]) 12/24/2022   Depression with anxiety 12/24/2022   Acute metabolic encephalopathy 09/18/2022   Acute encephalopathy 09/17/2022   AKI (acute kidney injury) ([name]) 09/04/2022   Generalized weakness 08/12/2022   Myasthenia gravis ([name]) 04/21/2022   CKD stage 3b, GFR 30-44 ml/min ([name]) 03/21/2022   Posterior vitreous detachment of both eyes 01/23/2022   Constipation 11/13/2021   DM2 (diabetes mellitus, type 2) ([name]) 11/13/2021   Severe major depression, single episode, without psychotic features ([name]) 11/13/2021   Amaurosis fugax 11/13/2021   Amnesia 11/13/2021   Anxiety 11/13/2021   Cardiac arrhythmia 11/13/2021   Hearing  loss 11/13/2021   Hypercoagulable state ([name]) 11/13/2021   Hyperparathyroidism due to renal insufficiency ([name]) 11/13/2021   Mild aortic stenosis 11/05/2021   Paroxysmal atrial fibrillation ([name]) 11/05/2021   Nasal septal deviation 08/30/2021   Anticoagulated by anticoagulation treatment 07/16/2021   Syncope 05/10/2021   Gastro-esophageal reflux disease without esophagitis 07/25/2020   Globus pharyngeus 07/25/2020   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) 12/21/2019   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   Chronic diastolic CHF (congestive heart failure) ([name]) 11/18/2019   OSA on CPAP 03/07/2019   Peripheral artery disease    Intractable vascular headache 11/11/2018   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 [name] [address] Phone: [phone] Fax: [phone]  [facility] - [place], [name] - [address] [name] [address] Phone: [phone] Fax: [phone]  [id number] - [place], [place] - [address] AT [address] OF [facility] [address] [name] [address] Phone: [phone] Fax: [phone]     Social Determinants of Health (SDOH) Social History: SDOH Screenings   Food Insecurity: No Food Insecurity (02/27/2023)  Housing: Low Risk  (02/27/2023)  Transportation Needs: No Transportation Needs (02/27/2023)  Utilities: Not At Risk (02/27/2023)  Depression (PHQ2-9): Low Risk  (05/20/2019)  Tobacco  Use: Low Risk  (02/26/2023)   SDOH Interventions:     Readmission Risk Interventions     No data to display

## 2023-02-27 NOTE — Progress Notes (Signed)
Patient performed NIF -24 and FVC 1.1L with good effort.

## 2023-02-28 DIAGNOSIS — U071 COVID-19: Secondary | ICD-10-CM | POA: Diagnosis not present

## 2023-02-28 LAB — T4, FREE: Free T4: 0.85 ng/dL (ref 0.61–1.12)

## 2023-02-28 LAB — GLUCOSE, CAPILLARY
Glucose-Capillary: 206 mg/dL — ABNORMAL HIGH (ref 70–99)
Glucose-Capillary: 298 mg/dL — ABNORMAL HIGH (ref 70–99)
Glucose-Capillary: 359 mg/dL — ABNORMAL HIGH (ref 70–99)
Glucose-Capillary: 385 mg/dL — ABNORMAL HIGH (ref 70–99)
Glucose-Capillary: 363 mg/dL — ABNORMAL HIGH (ref 70–99)

## 2023-02-28 LAB — MAGNESIUM: Magnesium: 1.8 mg/dL (ref 1.7–2.4)

## 2023-02-28 MED ORDER — INSULIN ASPART 100 UNIT/ML IJ SOLN
4.0000 [IU] | Freq: Three times a day (TID) | INTRAMUSCULAR | Status: DC
  Administered 2023-02-28 – 2023-03-01 (×2): 4 [IU] via SUBCUTANEOUS

## 2023-02-28 MED ORDER — INSULIN GLARGINE-YFGN 100 UNIT/ML ~~LOC~~ SOLN
30.0000 [IU] | Freq: Every day | SUBCUTANEOUS | Status: DC
  Administered 2023-02-28: 30 [IU] via SUBCUTANEOUS
  Filled 2023-02-28 (×2): qty 0.3

## 2023-02-28 NOTE — Evaluation (Signed)
Occupational Therapy Evaluation Patient Details Name: Fernando Hess MRN: [id number] DOB: 08-25-1946 Today's Date: 02/28/2023   History of Present Illness 77 yo male [facility] with SOB and weakness secondary to covid-19. Recent admission 5/20 with fall, AKI. 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   Clinical Impression   Fernando Hess was evaluated s/p the above admission list. He reports needing assist from [facility] and mobilizes at [facility] at baseline (pt gave conflicting report, questionable historian). Upon evaluation the pt was limited by impaired cognition, generalized weakness and unsteady balance. Overall he needed min A to stand for recliner and min G for ~10 minutes of static standing with RW. No increase in WOB with standing or marching in place. Pt declined mobility away from the chair due to fear of falls despite close chair follow. Due to the deficits listed below the pt also needs set up A for UB ADLs and up to mod A for LB ADL. Pt will benefit from continued acute OT services and skilled inpatient follow up therapy, <3 hours/day - per chart pt and pt family prefer pt d/c home with [facility] therapies.       Recommendations for follow up therapy are one component of a multi-disciplinary discharge planning process, led by the attending physician.  Recommendations may be updated based on patient status, additional functional criteria and insurance authorization.   Assistance Recommended at Discharge Frequent or constant Supervision/Assistance  Patient can return home with the following A lot of help with walking and/or transfers;A lot of help with bathing/dressing/bathroom;Assistance with feeding;Direct supervision/assist for medications management;Direct supervision/assist for financial management;Assist for transportation;Help with stairs or ramp for entrance    Functional Status  Assessment  Patient has had a recent decline in their functional status and demonstrates the ability to make significant improvements in function in a reasonable and predictable amount of time.  Equipment Recommendations  None recommended by OT    Recommendations for Other Services       Precautions / Restrictions Precautions Precautions: Fall Precaution Comments: covid Restrictions Weight Bearing Restrictions: No RLE Weight Bearing: Weight bearing as tolerated      Mobility Bed Mobility               General bed mobility comments: OOB in chair upon arrival    Transfers Overall transfer level: Needs assistance Equipment used: Rolling walker (2 wheels) Transfers: Sit to/from Stand Sit to Stand: Min assist           General transfer comment: increased time, cues for hand placement      Balance Overall balance assessment: Needs assistance, History of Falls Sitting-balance support: No upper extremity supported, Feet supported Sitting balance-Leahy Scale: Fair     Standing balance support: Single extremity supported, During functional activity Standing balance-Leahy Scale: Fair Standing balance comment: in static standing                           ADL either performed or assessed with clinical judgement   ADL Overall ADL's : Needs assistance/impaired Eating/Feeding: Set up;Sitting   Grooming: Set up;Sitting   Upper Body Bathing: Set up;Sitting   Lower Body Bathing: Moderate assistance;Sit to/from stand   Upper Body Dressing : Set up;Sitting   Lower Body Dressing: Moderate assistance;Sit to/from stand   Toilet Transfer: Minimal assistance;Stand-pivot;BSC/3in1;Rolling walker (2 wheels)   Toileting- Clothing Manipulation and Hygiene: Moderate  assistance;Sitting/lateral lean       Functional mobility during ADLs: Minimal assistance General ADL Comments: limite dby acitivty tolerance, fear of falling, impaired cogition, self limiting      Vision Baseline Vision/History: 0 No visual deficits Vision Assessment?: No apparent visual deficits     Perception Perception Perception Tested?: No   Praxis Praxis Praxis tested?: Not tested    Pertinent Vitals/Pain Pain Assessment Pain Assessment: Faces Faces Pain Scale: No hurt Pain Intervention(s): Monitored during session     Hand Dominance Right   Extremity/Trunk Assessment Upper Extremity Assessment Upper Extremity Assessment: Generalized weakness   Lower Extremity Assessment Lower Extremity Assessment: Defer to PT evaluation   Cervical / Trunk Assessment Cervical / Trunk Assessment: Kyphotic   Communication Communication Communication: No difficulties   Cognition Arousal/Alertness: Awake/alert Behavior During Therapy: Flat affect Overall Cognitive Status: Impaired/Different from baseline Area of Impairment: Safety/judgement, Awareness, Problem solving, Following commands                   Current Attention Level: Selective Memory: Decreased short-term memory Following Commands: Follows one step commands consistently Safety/Judgement: Decreased awareness of safety, Decreased awareness of deficits Awareness: Emergent Problem Solving: Slow processing, Decreased initiation, Requires verbal cues General Comments: self distracting thought, needs cues for attention and redirection. gave conflicting information     General Comments  VSS on RA, denies SOB    Exercises     Shoulder Instructions      Home Living Family/patient expects to be discharged to:: Private residence Living Arrangements: Spouse/significant other Available Help at Discharge: Family;Available 24 hours/day Type of Home: House Home Access: Ramped entrance     Home Layout: Two level;Able to live on main level with bedroom/bathroom     Bathroom Shower/Tub: Chief Strategy Officer: Handicapped height Bathroom Accessibility: Yes How Accessible: Accessible via  wheelchair Home Equipment: Rollator (4 wheels);Shower seat;Grab bars - tub/shower;BSC/3in1;Wheelchair - manual   Additional Comments: Has aide that comes 3 days/week for 3 hours to assist pt and his wife with ADL/IADLs      Prior Functioning/Environment Prior Level of Function : Needs assist;Patient poor historian/Family not available;History of Falls (last six months)             Mobility Comments: questionable historian, reported household ambulator to PT, reported only transfers with RW to [facility] and mobilizes in [facility] at home ADLs Comments: Pt reported independence with ADLs at start of session, then reports use of aide for bathing/dressing. Pt questionable historian.        OT Problem List: Decreased strength;Decreased range of motion;Decreased activity tolerance;Impaired balance (sitting and/or standing);Decreased cognition;Decreased knowledge of use of DME or AE;Decreased safety awareness;Decreased knowledge of precautions      OT Treatment/Interventions: Self-care/ADL training;DME and/or AE instruction;Energy conservation;Therapeutic activities;Cognitive remediation/compensation;Patient/family education;Balance training    OT Goals(Current goals can be found in the care plan section) Acute Rehab OT Goals Patient Stated Goal: to go home OT Goal Formulation: With patient Time For Goal Achievement: 03/14/23 Potential to Achieve Goals: Good ADL Goals Pt Will Perform Lower Body Dressing: with min guard assist;sit to/from stand Pt Will Transfer to Toilet: with min guard assist;ambulating;bedside commode Pt Will Perform Toileting - Clothing Manipulation and hygiene: with min guard assist;sitting/lateral leans Additional ADL Goal #1: pt will accurately complete medication management task with increased time  OT Frequency: Min 2X/week    Co-evaluation              AM-PAC OT "6 Clicks" Daily Activity  Outcome Measure Help from another person eating meals?: A Little Help from  another person taking care of personal grooming?: A Little Help from another person toileting, which includes using toliet, bedpan, or urinal?: A Lot Help from another person bathing (including washing, rinsing, drying)?: A Lot Help from another person to put on and taking off regular upper body clothing?: A Little Help from another person to put on and taking off regular lower body clothing?: A Lot 6 Click Score: 15   End of Session Equipment Utilized During Treatment: Gait belt;Rolling walker (2 wheels) Nurse Communication: Mobility status  Activity Tolerance: Patient tolerated treatment well Patient left: in chair;with call bell/phone within reach;with chair alarm set  OT Visit Diagnosis: Unsteadiness on feet (R26.81);Other abnormalities of gait and mobility (R26.89);Repeated falls (R29.6);Muscle weakness (generalized) (M62.81);History of falling (Z91.81)                Time: 1610-9604 OT Time Calculation (min): 18 min Charges:  OT General Charges $OT Visit: 1 Visit OT Evaluation $OT Eval Moderate Complexity: 1 Mod  Derenda Mis, OTR/L Acute Rehabilitation Services Office [phone] Secure Chat Communication Preferred   Donia Pounds 02/28/2023, 2:43 PM

## 2023-02-28 NOTE — Plan of Care (Signed)
Patient AOX4, VSS throughout shift.  All meds given on time as ordered.  Denied pain, purewick remains in place.  Diminished lungs, IS encouraged.  POC maintained, will continue to monitor.  Problem: Education: Goal: Knowledge of risk factors and measures for prevention of condition will improve Outcome: Progressing   Problem: Coping: Goal: Psychosocial and spiritual needs will be supported Outcome: Progressing   Problem: Respiratory: Goal: Will maintain a patent airway Outcome: Progressing Goal: Complications related to the disease process, condition or treatment will be avoided or minimized Outcome: Progressing   Problem: Education: Goal: Ability to describe self-care measures that may prevent or decrease complications (Diabetes Survival Skills Education) will improve Outcome: Progressing Goal: Individualized Educational Video(s) Outcome: Progressing   Problem: Coping: Goal: Ability to adjust to condition or change in health will improve Outcome: Progressing   Problem: Fluid Volume: Goal: Ability to maintain a balanced intake and output will improve Outcome: Progressing   Problem: Health Behavior/Discharge Planning: Goal: Ability to identify and utilize available resources and services will improve Outcome: Progressing Goal: Ability to manage health-related needs will improve Outcome: Progressing   Problem: Metabolic: Goal: Ability to maintain appropriate glucose levels will improve Outcome: Progressing   Problem: Nutritional: Goal: Maintenance of adequate nutrition will improve Outcome: Progressing Goal: Progress toward achieving an optimal weight will improve Outcome: Progressing   Problem: Skin Integrity: Goal: Risk for impaired skin integrity will decrease Outcome: Progressing   Problem: Tissue Perfusion: Goal: Adequacy of tissue perfusion will improve Outcome: Progressing   Problem: Education: Goal: Knowledge of General Education information will  improve Description: Including pain rating scale, medication(s)/side effects and non-pharmacologic comfort measures Outcome: Progressing   Problem: Health Behavior/Discharge Planning: Goal: Ability to manage health-related needs will improve Outcome: Progressing   Problem: Clinical Measurements: Goal: Ability to maintain clinical measurements within normal limits will improve Outcome: Progressing Goal: Will remain free from infection Outcome: Progressing Goal: Diagnostic test results will improve Outcome: Progressing Goal: Respiratory complications will improve Outcome: Progressing Goal: Cardiovascular complication will be avoided Outcome: Progressing   Problem: Activity: Goal: Risk for activity intolerance will decrease Outcome: Progressing   Problem: Nutrition: Goal: Adequate nutrition will be maintained Outcome: Progressing   Problem: Coping: Goal: Level of anxiety will decrease Outcome: Progressing   Problem: Elimination: Goal: Will not experience complications related to bowel motility Outcome: Progressing Goal: Will not experience complications related to urinary retention Outcome: Progressing   Problem: Pain Managment: Goal: General experience of comfort will improve Outcome: Progressing   Problem: Safety: Goal: Ability to remain free from injury will improve Outcome: Progressing   Problem: Skin Integrity: Goal: Risk for impaired skin integrity will decrease Outcome: Progressing

## 2023-02-28 NOTE — Progress Notes (Signed)
I was informed by charge RN who was called by MD that Fernando Hess has palpations. 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 His rhythm has been SR w/ intermittent BBB. He has occasional PVC's. He did not tell me that he had palpations or CP and denies it now. He seemed in good spirits every time I checked on him. Dr Sunnie Nielsen notified.

## 2023-02-28 NOTE — Progress Notes (Addendum)
PROGRESS NOTE    Fernando Hess  HYQ:[id number] DOB: 01-05-1946 DOA: 02/26/2023 PCP: [name], MD   Brief Narrative: 77 year old with past medical history significant for myasthenia gravis, chronic diastolic heart failure, paroxysmal A-fib on Eliquis, CKD 3B creatinine baseline 1.7--2.3, diabetes type 2 presents 6/13 complaining of cough, rhinorrhea symptoms started 6/10 patient was diagnosed with COVID.  His wife was diagnosed with COVID first.  He also reports 3 days of generalized weakness, symmetrical weakness in bilateral lower extremity.  Patient was diagnosed with COVID, found to have also hyperglycemia, and generalized weakness.    Assessment & Plan:   Principal Problem:   COVID-19 virus infection Active Problems:   Hyperlipidemia   Chronic diastolic CHF (congestive heart failure) ([name])   Paroxysmal atrial fibrillation ([name])   DM2 (diabetes mellitus, type 2) ([name])   CKD stage 3b, GFR 30-44 ml/min ([name])   Generalized weakness   BPH (benign prostatic hyperplasia)   History of anemia due to chronic kidney disease   COVID-19  1-COVID-19 infection: Patient presents with cough, rhinitis for the last 4 days.  COVID PCR positive. -Monitor for hypoxia -Report improvement of symptoms.  -Contraindication to Paxlovid.  -no chest x ray finding or hypoxia.  -Support care.   2-generalized weakness: B12: ,2200 CPK 45 , TSH: 0.135 low. Will check free T 3 and T 4.  Neurology consulted Plan to monitor overnight, symptoms related to Covid.  NIF -24, FVC 1.1L.  NIF -40 VC 1.9 L stable.  Neurology recommend to monitor NIF daily if stable. Plan to repeat tomorrow.   Chronic diastolic heart failure: Compensated. Not on diuretics at home.  Monitor,.   Paroxysmal A-fib: Continue with Eliquis Report some palpitation. EKG, check mg.   CKD stage IIIb: Baseline Cr 1.7--2.3 Stable. Monitor.   Hyperlipidemia: Holding statin for now due to weakness.   Diabetes type  2: Increase Semglee to 25 units.  Started Tradjenta.   BPH: On Flmax  Anemia of chronic kidney disease: Monitor hb,    HTN; uncontrolled. He has PRN hydralazine.        Estimated body mass index is 26.63 kg/m as calculated from the following:   Height as of this encounter: 5\' 6"  (1.676 m).   Weight as of this encounter: 74.8 kg.   DVT prophylaxis: Eliquis Code Status: Full code Family Communication: Care discussed with patient Disposition Plan:  Status is: Inpatient Remains inpatient appropriate because: management of Covid, weakness. Plan to discharge tomorrow if NIF stable.     Consultants:  Neurology   Procedures:    Antimicrobials:    Subjective: He is sitting recliner. He feels better, symptoms improved.    Objective: Vitals:   02/28/23 0519 02/28/23 0730 02/28/23 0730 02/28/23 0743  BP: (!) 162/93 (!) 189/88    Pulse: 89 91    Resp: 18     Temp: 98.2 F (36.8 C)  98.3 F (36.8 C)   TempSrc:      SpO2: 98% 95%  93%  Weight:      Height:        Intake/Output Summary (Last 24 hours) at 02/28/2023 1344 Last data filed at 02/28/2023 1336 Gross per 24 hour  Intake --  Output 1200 ml  Net -1200 ml   Filed Weights   02/26/23 1947  Weight: 74.8 kg    Examination:  General exam: NAD Respiratory system: CTA Cardiovascular system: S 1, S 2 RRR Gastrointestinal system: BS present, soft, nt Central nervous system: alert, follows command Extremities:  no edema   Data Reviewed: I have personally reviewed following labs and imaging studies  CBC: Recent Labs  Lab 02/26/23 1600 02/27/23 0522  WBC 8.1 8.7  NEUTROABS  --  6.6  HGB 12.1* 12.5*  HCT 38.9* 38.9*  MCV 93.1 90.3  PLT 196 190    Basic Metabolic Panel: Recent Labs  Lab 02/26/23 1600 02/26/23 2242 02/27/23 0522  NA 133*  --  136  K 4.7  --  4.3  CL 101  --  103  CO2 23  --  25  GLUCOSE 352*  --  321*  BUN 34*  --  30*  CREATININE 1.91*  --  1.76*  CALCIUM 7.8*  --   8.3*  MG  --  2.0 2.0  PHOS  --   --  3.6    GFR: Estimated Creatinine Clearance: 31.2 mL/min (A) (by C-G formula based on SCr of 1.76 mg/dL (H)). Liver Function Tests: Recent Labs  Lab 02/27/23 0522  AST 32  ALT 42  ALKPHOS 111  BILITOT 0.6  PROT 5.3*  ALBUMIN 2.7*    No results for input(s): "LIPASE", "AMYLASE" in the last 168 hours. No results for input(s): "AMMONIA" in the last 168 hours. Coagulation Profile: Recent Labs  Lab 02/26/23 1600  INR 1.4*    Cardiac Enzymes: Recent Labs  Lab 02/27/23 0522  CKTOTAL 45*    BNP (last 3 results) No results for input(s): "PROBNP" in the last 8760 hours. HbA1C: No results for input(s): "HGBA1C" in the last 72 hours. CBG: Recent Labs  Lab 02/27/23 1627 02/27/23 2116 02/28/23 0815 02/28/23 1156 02/28/23 1310  GLUCAP 269* 389* 206* 298* 359*    Lipid Profile: No results for input(s): "CHOL", "HDL", "LDLCALC", "TRIG", "CHOLHDL", "LDLDIRECT" in the last 72 hours. Thyroid Function Tests: Recent Labs    02/27/23 0522 02/28/23 0343  TSH 0.135*  --   FREET4  --  0.85    Anemia Panel: Recent Labs    02/27/23 0522  VITAMINB12 2,204*    Sepsis Labs: Recent Labs  Lab 02/26/23 2242  PROCALCITON <0.10     Recent Results (from the past 240 hour(s))  SARS Coronavirus 2 by RT PCR (hospital order, performed in [facility] hospital lab) *cepheid single result test* Anterior Nasal Swab     Status: Abnormal   Collection Time: 02/26/23  4:02 PM   Specimen: Anterior Nasal Swab  Result Value Ref Range Status   SARS Coronavirus 2 by RT PCR POSITIVE (A) NEGATIVE Final    Comment: Performed at [facility], [address], [place], [name] [address]         Radiology Studies: DG Chest Portable 1 View  Result Date: 02/26/2023 CLINICAL DATA:  Weakness. Shortness of breath. Diarrhea. The patient's wife was diagnosed with COVID yesterday. EXAM: PORTABLE CHEST 1 VIEW COMPARISON:  One view chest x-ray FINDINGS:  Heart size is exaggerated by low lung volumes. The right hemidiaphragm is slightly elevated. No edema or effusion is present. No focal airspace disease is present. The visualized soft tissues and bony thorax are unremarkable. IMPRESSION: 1. Low lung volumes. 2. No acute cardiopulmonary disease. Electronically Signed   By: Marin Roberts M.D.   On: 02/26/2023 16:39        Scheduled Meds:  apixaban  5 mg Oral BID   ascorbic acid  500 mg Oral Daily   budesonide  0.25 mg Nebulization BID   buPROPion  150 mg Oral q morning   guaiFENesin  600 mg Oral BID   insulin aspart  0-5 Units Subcutaneous QHS   insulin aspart  0-9 Units Subcutaneous TID WC   insulin glargine-yfgn  25 Units Subcutaneous QHS   linagliptin  5 mg Oral Daily   pantoprazole  40 mg Oral q AM   tamsulosin  0.4 mg Oral QHS   Continuous Infusions:   LOS: 1 day    Time spent: 35 minutes    Rasheen Schewe A Lebaron Bautch, MD [facility]   If 7PM-7AM, please contact night-coverage [email or web address]  02/28/2023, 1:44 PM

## 2023-02-28 NOTE — Progress Notes (Signed)
RT obtained NIF and VC with good pt effort. NIF -40 VC 1.94L

## 2023-03-01 DIAGNOSIS — U071 COVID-19: Secondary | ICD-10-CM | POA: Diagnosis not present

## 2023-03-01 LAB — GLUCOSE, CAPILLARY
Glucose-Capillary: 234 mg/dL — ABNORMAL HIGH (ref 70–99)
Glucose-Capillary: 154 mg/dL — ABNORMAL HIGH (ref 70–99)

## 2023-03-01 MED ORDER — BUDESONIDE 0.25 MG/2ML IN SUSP: 0.2500 mg | Freq: Two times a day (BID) | RESPIRATORY_TRACT | 12 refills | Status: DC

## 2023-03-01 MED ORDER — MAGNESIUM SULFATE 2 GM/50ML IV SOLN
2.0000 g | Freq: Once | INTRAVENOUS | Status: AC
  Administered 2023-03-01: 2 g via INTRAVENOUS
  Filled 2023-03-01: qty 50

## 2023-03-01 MED ORDER — ASCORBIC ACID 500 MG PO TABS: 500.0000 mg | ORAL_TABLET | Freq: Every day | ORAL | 0 refills | Status: DC

## 2023-03-01 MED ORDER — LINAGLIPTIN 5 MG PO TABS: 5.0000 mg | ORAL_TABLET | Freq: Every day | ORAL | 0 refills | Status: DC

## 2023-03-01 MED ORDER — FREESTYLE LIBRE 3 SENSOR MISC: 1.0000 | Freq: Every day | 0 refills | Status: DC

## 2023-03-01 NOTE — Plan of Care (Signed)
Patient AOX4, VSS throughout shift. All meds given on time as ordered. Denied pain, purewick remains in place. Pt had bath. Diminished lungs, IS encouraged. POC maintained, will continue to monitor.  Problem: Education: Goal: Knowledge of risk factors and measures for prevention of condition will improve 03/01/2023 1142 by Burnett Corrente, RN Outcome: Progressing 03/01/2023 0533 by Burnett Corrente, RN Outcome: Progressing   Problem: Coping: Goal: Psychosocial and spiritual needs will be supported 03/01/2023 1142 by Burnett Corrente, RN Outcome: Progressing 03/01/2023 0533 by Burnett Corrente, RN Outcome: Progressing   Problem: Respiratory: Goal: Will maintain a patent airway 03/01/2023 1142 by Burnett Corrente, RN Outcome: Progressing 03/01/2023 0533 by Burnett Corrente, RN Outcome: Progressing Goal: Complications related to the disease process, condition or treatment will be avoided or minimized 03/01/2023 1142 by Burnett Corrente, RN Outcome: Progressing 03/01/2023 0533 by Burnett Corrente, RN Outcome: Progressing   Problem: Education: Goal: Ability to describe self-care measures that may prevent or decrease complications (Diabetes Survival Skills Education) will improve 03/01/2023 1142 by Burnett Corrente, RN Outcome: Progressing 03/01/2023 0533 by Burnett Corrente, RN Outcome: Progressing Goal: Individualized Educational Video(s) 03/01/2023 1142 by Burnett Corrente, RN Outcome: Progressing 03/01/2023 0533 by Burnett Corrente, RN Outcome: Progressing   Problem: Coping: Goal: Ability to adjust to condition or change in health will improve 03/01/2023 1142 by Burnett Corrente, RN Outcome: Progressing 03/01/2023 0533 by Burnett Corrente, RN Outcome: Progressing   Problem: Fluid Volume: Goal: Ability to maintain a balanced intake and output will improve 03/01/2023 1142 by Burnett Corrente, RN Outcome: Progressing 03/01/2023 0533 by Burnett Corrente, RN Outcome: Progressing   Problem: Health Behavior/Discharge Planning: Goal:  Ability to identify and utilize available resources and services will improve 03/01/2023 1142 by Burnett Corrente, RN Outcome: Progressing 03/01/2023 0533 by Burnett Corrente, RN Outcome: Progressing Goal: Ability to manage health-related needs will improve 03/01/2023 1142 by Burnett Corrente, RN Outcome: Progressing 03/01/2023 0533 by Burnett Corrente, RN Outcome: Progressing   Problem: Metabolic: Goal: Ability to maintain appropriate glucose levels will improve 03/01/2023 1142 by Burnett Corrente, RN Outcome: Progressing 03/01/2023 0533 by Burnett Corrente, RN Outcome: Progressing   Problem: Nutritional: Goal: Maintenance of adequate nutrition will improve 03/01/2023 1142 by Burnett Corrente, RN Outcome: Progressing 03/01/2023 0533 by Burnett Corrente, RN Outcome: Progressing Goal: Progress toward achieving an optimal weight will improve 03/01/2023 1142 by Burnett Corrente, RN Outcome: Progressing 03/01/2023 0533 by Burnett Corrente, RN Outcome: Progressing   Problem: Skin Integrity: Goal: Risk for impaired skin integrity will decrease 03/01/2023 1142 by Burnett Corrente, RN Outcome: Progressing 03/01/2023 0533 by Burnett Corrente, RN Outcome: Progressing   Problem: Tissue Perfusion: Goal: Adequacy of tissue perfusion will improve 03/01/2023 1142 by Burnett Corrente, RN Outcome: Progressing 03/01/2023 0533 by Burnett Corrente, RN Outcome: Progressing   Problem: Education: Goal: Knowledge of General Education information will improve Description: Including pain rating scale, medication(s)/side effects and non-pharmacologic comfort measures 03/01/2023 1142 by Burnett Corrente, RN Outcome: Progressing 03/01/2023 0533 by Burnett Corrente, RN Outcome: Progressing   Problem: Health Behavior/Discharge Planning: Goal: Ability to manage health-related needs will improve 03/01/2023 1142 by Burnett Corrente, RN Outcome: Progressing 03/01/2023 0533 by Burnett Corrente, RN Outcome: Progressing   Problem: Clinical Measurements: Goal:  Ability to maintain clinical measurements within normal limits will improve 03/01/2023 1142 by Burnett Corrente, RN Outcome: Progressing 03/01/2023 0533 by Burnett Corrente, RN Outcome: Progressing Goal: Will remain free from infection 03/01/2023 1142 by Burnett Corrente, RN Outcome: Progressing 03/01/2023 0533 by Burnett Corrente, RN Outcome: Progressing Goal: Diagnostic test results will improve 03/01/2023 1142 by  Burnett Corrente, RN Outcome: Progressing 03/01/2023 0533 by Burnett Corrente, RN Outcome: Progressing Goal: Respiratory complications will improve 03/01/2023 1142 by Burnett Corrente, RN Outcome: Progressing 03/01/2023 0533 by Burnett Corrente, RN Outcome: Progressing Goal: Cardiovascular complication will be avoided 03/01/2023 1142 by Burnett Corrente, RN Outcome: Progressing 03/01/2023 0533 by Burnett Corrente, RN Outcome: Progressing   Problem: Activity: Goal: Risk for activity intolerance will decrease 03/01/2023 1142 by Burnett Corrente, RN Outcome: Progressing 03/01/2023 0533 by Burnett Corrente, RN Outcome: Progressing   Problem: Nutrition: Goal: Adequate nutrition will be maintained 03/01/2023 1142 by Burnett Corrente, RN Outcome: Progressing 03/01/2023 0533 by Burnett Corrente, RN Outcome: Progressing   Problem: Coping: Goal: Level of anxiety will decrease 03/01/2023 1142 by Burnett Corrente, RN Outcome: Progressing 03/01/2023 0533 by Burnett Corrente, RN Outcome: Progressing   Problem: Elimination: Goal: Will not experience complications related to bowel motility 03/01/2023 1142 by Burnett Corrente, RN Outcome: Progressing 03/01/2023 0533 by Burnett Corrente, RN Outcome: Progressing Goal: Will not experience complications related to urinary retention 03/01/2023 1142 by Burnett Corrente, RN Outcome: Progressing 03/01/2023 0533 by Burnett Corrente, RN Outcome: Progressing   Problem: Pain Managment: Goal: General experience of comfort will improve 03/01/2023 1142 by Burnett Corrente, RN Outcome: Progressing 03/01/2023  0533 by Burnett Corrente, RN Outcome: Progressing   Problem: Safety: Goal: Ability to remain free from injury will improve 03/01/2023 1142 by Burnett Corrente, RN Outcome: Progressing 03/01/2023 0533 by Burnett Corrente, RN Outcome: Progressing   Problem: Skin Integrity: Goal: Risk for impaired skin integrity will decrease 03/01/2023 1142 by Burnett Corrente, RN Outcome: Progressing 03/01/2023 0533 by Burnett Corrente, RN Outcome: Progressing

## 2023-03-01 NOTE — Progress Notes (Signed)
RT  obtained NIF/VC with good pt effort.  VC 2.24L NIF -35

## 2023-03-01 NOTE — Plan of Care (Signed)
Patient AOX4, VSS throughout shift. All meds given on time as ordered. Denied pain, purewick remains in place. Diminished lungs, IS encouraged. POC maintained, will continue to monitor.  Problem: Education: Goal: Knowledge of risk factors and measures for prevention of condition will improve Outcome: Progressing   Problem: Coping: Goal: Psychosocial and spiritual needs will be supported Outcome: Progressing   Problem: Respiratory: Goal: Will maintain a patent airway Outcome: Progressing Goal: Complications related to the disease process, condition or treatment will be avoided or minimized Outcome: Progressing   Problem: Education: Goal: Ability to describe self-care measures that may prevent or decrease complications (Diabetes Survival Skills Education) will improve Outcome: Progressing Goal: Individualized Educational Video(s) Outcome: Progressing   Problem: Coping: Goal: Ability to adjust to condition or change in health will improve Outcome: Progressing   Problem: Fluid Volume: Goal: Ability to maintain a balanced intake and output will improve Outcome: Progressing   Problem: Health Behavior/Discharge Planning: Goal: Ability to identify and utilize available resources and services will improve Outcome: Progressing Goal: Ability to manage health-related needs will improve Outcome: Progressing   Problem: Metabolic: Goal: Ability to maintain appropriate glucose levels will improve Outcome: Progressing   Problem: Nutritional: Goal: Maintenance of adequate nutrition will improve Outcome: Progressing Goal: Progress toward achieving an optimal weight will improve Outcome: Progressing   Problem: Skin Integrity: Goal: Risk for impaired skin integrity will decrease Outcome: Progressing   Problem: Tissue Perfusion: Goal: Adequacy of tissue perfusion will improve Outcome: Progressing   Problem: Education: Goal: Knowledge of General Education information will  improve Description: Including pain rating scale, medication(s)/side effects and non-pharmacologic comfort measures Outcome: Progressing   Problem: Health Behavior/Discharge Planning: Goal: Ability to manage health-related needs will improve Outcome: Progressing   Problem: Clinical Measurements: Goal: Ability to maintain clinical measurements within normal limits will improve Outcome: Progressing Goal: Will remain free from infection Outcome: Progressing Goal: Diagnostic test results will improve Outcome: Progressing Goal: Respiratory complications will improve Outcome: Progressing Goal: Cardiovascular complication will be avoided Outcome: Progressing   Problem: Activity: Goal: Risk for activity intolerance will decrease Outcome: Progressing   Problem: Nutrition: Goal: Adequate nutrition will be maintained Outcome: Progressing   Problem: Coping: Goal: Level of anxiety will decrease Outcome: Progressing   Problem: Elimination: Goal: Will not experience complications related to bowel motility Outcome: Progressing Goal: Will not experience complications related to urinary retention Outcome: Progressing   Problem: Pain Managment: Goal: General experience of comfort will improve Outcome: Progressing   Problem: Safety: Goal: Ability to remain free from injury will improve Outcome: Progressing   Problem: Skin Integrity: Goal: Risk for impaired skin integrity will decrease Outcome: Progressing

## 2023-03-01 NOTE — TOC Transition Note (Addendum)
Transition of Care [facility]) - CM/SW Discharge Note   Patient Details  Name: Fernando Hess MRN: [id number] Date of Birth: November 29, 1945  [facility] CM/SW Contact:  Ronny Bacon, RN Phone Number: 03/01/2023, 10:57 AM   Clinical Narrative:  Spoke with wife by phone. Wife informed patient being discharged home today. Wife unable to pick patient up and request that we arrange ambulance transportation to home. Wife confirms that patient has stair climber from garage to first floor kitchen. Wife confirmed that patient has HH services through [facility]) and would like to continue with her.   Wife reports did not receive any DME from last visit. Call to [employer], WC, BSC, Nebulizer ordered.  Reached out to [facility] to confirm patient current with the services. Eber Jones with [facility] confirmed patient is current with them.  [facility] notified of need to transport home.    Final next level of care: Home w Home Health Services Barriers to Discharge: No Barriers Identified   Patient Goals and CMS Choice      Discharge Placement                         Discharge Plan and Services Additional resources added to the After Visit Summary for   In-house Referral: Clinical Social Work                        HH Arranged: PT, OT, Nurse's Aide, Social Work          Social Determinants of Health (SDOH) Interventions SDOH Screenings   Food Insecurity: No Food Insecurity (02/27/2023)  Housing: Low Risk  (02/27/2023)  Transportation Needs: No Transportation Needs (02/27/2023)  Utilities: Not At Risk (02/27/2023)  Depression (PHQ2-9): Low Risk  (05/20/2019)  Tobacco Use: Low Risk  (02/26/2023)     Readmission Risk Interventions     No data to display

## 2023-03-01 NOTE — Discharge Summary (Signed)
Physician Discharge Summary   Patient: Fernando Hess MRN: [id number] DOB: Jul 12, 1946  Admit date:     02/26/2023  Discharge date: 03/01/23  Discharge Physician: Alba Cory   PCP: [name], MD   Recommendations at discharge:    Patient will need to transport by ambulance due to chronic medical issues and weakness, and debility./ he is high risk for fall, or transportation by private car.  Will need to follow up with PCP and Neurology for further care weakness.   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 [facility] Course: 77 year old with past medical history significant for myasthenia gravis, chronic diastolic heart failure, paroxysmal A-fib on Eliquis, CKD 3B creatinine baseline 1.7--2.3, diabetes type 2 presents 6/13 complaining of cough, rhinorrhea symptoms started 6/10 patient was diagnosed with COVID.  His wife was diagnosed with COVID first.  He also reports 3 days of generalized weakness, symmetrical weakness in bilateral lower extremity.   Patient was diagnosed with COVID, found to have also hyperglycemia, and generalized weakness.  Assessment and Plan: 1-COVID-19 infection: Patient presents with cough, rhinitis for the last 4 days.  COVID PCR positive. -Monitor for hypoxia -Report improvement of symptoms.  -Contraindication to Paxlovid.  -no chest x ray finding or hypoxia.  -Support care  improved.   2-Generalized weakness: B12: ,2200 CPK 45 , TSH: 0.135 low. Will check free T 3 and T 4.  Neurology consulted Plan to monitor overnight,  symptoms related to Covid.  NIF -24, FVC 1.1L.  NIF -40 VC 1.9 L stable.  Neurology recommend to monitor NIF daily if stable.  NIF stable. Ok to discharge per neurology.    Chronic diastolic heart failure: Compensated. Not on diuretics at home.  Monitor,.    Paroxysmal A-fib: Continue with Eliquis Report some palpitation. EKG normal.    CKD stage IIIb: Baseline Cr 1.7--2.3 Stable. Monitor.    Hyperlipidemia: Resume statins.    Diabetes type 2: Resume lantus 40 units, and SSI Started Tradjenta.    BPH: On Flomax   Anemia of chronic kidney disease: Monitor hb.      HTN; Uncontrolled. He has PRN hydralazine.               Consultants: Neurology  Procedures performed:  Disposition: Home Diet recommendation:  Discharge Diet Orders (From admission, onward)     Start     Ordered   03/01/23 0000  Diet Carb Modified        03/01/23 1031           Carb modified diet DISCHARGE MEDICATION: Allergies as of 03/01/2023       Reactions   Cilostazol Swelling, Other (See Comments)   Edema   Dulaglutide Nausea And Vomiting, Other (See Comments)   TRULICITY   Levofloxacin Hives, Itching, Rash   Liraglutide Other (See Comments)   Severe fatigue & insomnia   Lisinopril Itching, Rash, Cough        Medication List     STOP taking these medications    azithromycin 250 MG tablet Commonly known as: ZITHROMAX   feeding  supplement (GLUCERNA SHAKE) Liqd   insulin glargine-yfgn 100 UNIT/ML injection Commonly known as: SEMGLEE   latanoprost 0.005 % ophthalmic solution Commonly known as: XALATAN   predniSONE 10 MG tablet Commonly known as: DELTASONE       TAKE these medications    acetaminophen 500 MG tablet Commonly known as: TYLENOL Take 1,000 mg by mouth every 6 (six) hours as needed for moderate pain.   albuterol 108 (90 Base) MCG/ACT inhaler Commonly known as: VENTOLIN HFA Inhale 2 puffs into the lungs every 4 (four) hours as needed for  wheezing or shortness of breath.   apixaban 5 MG Tabs tablet Commonly known as: ELIQUIS Take 1 tablet (5 mg total) by mouth 2 (two) times daily.   ascorbic acid 500 MG tablet Commonly known as: VITAMIN C Take 1 tablet (500 mg total) by mouth daily. Start taking on: March 02, 2023   atorvastatin 80 MG tablet Commonly known as: LIPITOR TAKE ONE TABLET BY MOUTH EVERY EVENING What changed: when to take this   benzonatate 200 MG capsule Commonly known as: TESSALON Take 200 mg by mouth 3 (three) times daily as needed for cough.   budesonide 0.25 MG/2ML nebulizer solution Commonly known as: PULMICORT Take 2 mLs (0.25 mg total) by nebulization 2 (two) times daily.   buPROPion 300 MG 24 hr tablet Commonly known as: WELLBUTRIN XL Take 300 mg by mouth every morning.   cyanocobalamin 1000 MCG/ML injection Commonly known as: VITAMIN B12 Inject 1,000 mcg into the muscle once a week.   DULoxetine 30 MG capsule Commonly known as: CYMBALTA Take 1 capsule (30 mg total) by mouth every morning.   FreeStyle Libre 3 Sensor Misc 1 Application by Does not apply route daily. Place 1 sensor on the skin every 14 days. Use to check glucose continuously   guaiFENesin-codeine 100-10 MG/5ML syrup Take 10 mLs by mouth every 4 (four) hours as needed for cough.   hydrALAZINE 25 MG tablet Commonly known as: APRESOLINE Take 1 tablet (25 mg total) by mouth every 6 (six) hours as needed (SBP>160 or DBP>110).   insulin lispro 100 UNIT/ML injection Commonly known as: HUMALOG Inject 0-8 Units into the skin 3 (three) times daily. Sliding scale   ketoconazole 2 % cream Commonly known as: NIZORAL Apply 1 Application topically 2 (two) times daily as needed for irritation.   Lantus SoloStar 100 UNIT/ML Solostar Pen Generic drug: insulin glargine Inject 40 Units into the skin daily.   linagliptin 5 MG Tabs tablet Commonly known as: TRADJENTA Take 1 tablet (5 mg total) by mouth daily. Start taking on:  March 02, 2023   MAGNESIUM PO Take 1 tablet by mouth daily.   nitroGLYCERIN 0.4 MG SL tablet Commonly known as: NITROSTAT Place 0.4 mg under the tongue every 5 (five) minutes as needed for chest pain.   nystatin powder Commonly known as: MYCOSTATIN/NYSTOP Apply 1 Application topically 2 (two) times daily as needed (skin irritation). Apply to the groin in the morning and evening   ondansetron 4 MG tablet Commonly known as: ZOFRAN Take 4 mg by mouth every 8 (eight) hours as needed for nausea or vomiting.   oxyCODONE-acetaminophen 5-325 MG tablet Commonly known as: PERCOCET/ROXICET Take 1 tablet by mouth every 6 (six) hours as needed for severe pain.   pantoprazole 40 MG tablet Commonly known as: PROTONIX TAKE 1 TABLET BY MOUTH EVERY DAY What changed: when to take this   polyethylene glycol 17 g packet Commonly known as: MIRALAX / GLYCOLAX Take 17 g  by mouth 2 (two) times daily as needed for mild constipation.   QUEtiapine 50 MG tablet Commonly known as: SEROQUEL Take 50 mg by mouth at bedtime.   SENIOR MULTIVITAMIN PLUS PO Take 1 tablet by mouth daily with breakfast.   senna-docusate 8.6-50 MG tablet Commonly known as: Senokot-S Take 1 tablet by mouth 2 (two) times daily as needed for moderate constipation.   tamsulosin 0.4 MG Caps capsule Commonly known as: FLOMAX Take 1 capsule (0.4 mg total) by mouth daily after breakfast. What changed: when to take this               Durable Medical Equipment  (From admission, onward)           Start     Ordered   03/01/23 1143  For home use only DME standard manual wheelchair with seat cushion  Once       Comments: Patient suffers from weakness which impairs their ability to perform daily activities like toileting in the home.  A cane will not resolve issue with performing activities of daily living. A wheelchair will allow patient to safely perform daily activities. Patient can safely propel the wheelchair in the home  or has a caregiver who can provide assistance. Length of need Lifetime. Accessories: elevating leg rests (ELRs), wheel locks, extensions and anti-tippers.   03/01/23 1143            Discharge Exam: Filed Weights   02/26/23 1947  Weight: 74.8 kg   General; NAD  Condition at discharge: stable  The results of significant diagnostics from this hospitalization (including imaging, microbiology, ancillary and laboratory) are listed below for reference.   Imaging Studies: DG Chest Portable 1 View  Result Date: 02/26/2023 CLINICAL DATA:  Weakness. Shortness of breath. Diarrhea. The patient's wife was diagnosed with COVID yesterday. EXAM: PORTABLE CHEST 1 VIEW COMPARISON:  One view chest x-ray FINDINGS: Heart size is exaggerated by low lung volumes. The right hemidiaphragm is slightly elevated. No edema or effusion is present. No focal airspace disease is present. The visualized soft tissues and bony thorax are unremarkable. IMPRESSION: 1. Low lung volumes. 2. No acute cardiopulmonary disease. Electronically Signed   By: Marin Roberts M.D.   On: 02/26/2023 16:39   DG Hip Unilat W or Wo Pelvis 2-3 Views Right  Result Date: 02/08/2023 CLINICAL DATA:  Trauma, fall.  Right hip pain. EXAM: DG HIP (WITH OR WITHOUT PELVIS) 2-3V RIGHT COMPARISON:  01/08/2023. 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 There is no evidence of acute fracture or dislocation. No hardware loosening. There is no evidence of arthropathy or other focal bone abnormality. IMPRESSION: Total hip arthroplasty changes. No evidence of acute fracture, dislocation, or hardware loosening. Electronically Signed   By: Thornell Sartorius M.D.   On: 02/08/2023 21:27   DG Chest Port 1 View  Result Date: 02/08/2023 CLINICAL DATA:  Trauma, fall.  Shoulder pain. EXAM: PORTABLE CHEST 1 VIEW COMPARISON:  02/02/2023. FINDINGS: The heart size and mediastinal contours are within normal limits. There is atherosclerotic  calcification of the aorta. No consolidation, effusion, or pneumothorax. Sternotomy wires are noted. No acute fracture or dislocation at the right shoulder. There are mild degenerative changes at the acromioclavicular joint. The soft tissues are normal. IMPRESSION: 1. No acute cardiopulmonary disease. 2. No acute fracture or dislocation at the right shoulder. Electronically Signed   By: Thornell Sartorius M.D.   On: 02/08/2023 21:25   DG Shoulder Right  Result Date: 02/08/2023 CLINICAL  DATA:  Trauma, fall.  Shoulder pain. EXAM: PORTABLE CHEST 1 VIEW COMPARISON:  02/02/2023. FINDINGS: The heart size and mediastinal contours are within normal limits. There is atherosclerotic calcification of the aorta. No consolidation, effusion, or pneumothorax. Sternotomy wires are noted. No acute fracture or dislocation at the right shoulder. There are mild degenerative changes at the acromioclavicular joint. The soft tissues are normal. IMPRESSION: 1. No acute cardiopulmonary disease. 2. No acute fracture or dislocation at the right shoulder. Electronically Signed   By: Thornell Sartorius M.D.   On: 02/08/2023 21:25   CT Head Wo Contrast  Result Date: 02/02/2023 CLINICAL DATA:  Head trauma, minor (Age >= 65y) EXAM: CT HEAD WITHOUT CONTRAST TECHNIQUE: Contiguous axial images were obtained from the base of the skull through the vertex without intravenous contrast. RADIATION DOSE REDUCTION: This exam was performed according to the departmental dose-optimization program which includes automated exposure control, adjustment of the mA and/or kV according to patient size and/or use of iterative reconstruction technique. COMPARISON:  01/07/2023 FINDINGS: Brain: Normal anatomic configuration. Parenchymal volume loss is commensurate with the patient's age. Mild periventricular white matter changes are present likely reflecting the sequela of small vessel ischemia. 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 No abnormal  intra or extra-axial mass lesion or fluid collection. No abnormal mass effect or midline shift. No evidence of acute intracranial hemorrhage or infarct. Ventricular size is normal. Cerebellum unremarkable. Vascular: No asymmetric hyperdense vasculature at the skull base. Skull: Intact Sinuses/Orbits: Paranasal sinuses are clear. Orbits are unremarkable. Other: Mastoid air cells and middle ear cavities are clear. IMPRESSION: 1. No acute intracranial abnormality. No calvarial fracture. 2. Mild senescent change. 3. 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 Electronically Signed   By: Helyn Numbers M.D.   On: 02/02/2023 23:08   DG Chest Port 1 View  Result Date: 02/02/2023 CLINICAL DATA:  Dyspnea EXAM: PORTABLE CHEST 1 VIEW COMPARISON:  01/08/2023 FINDINGS: Lungs are clear. No pneumothorax or pleural effusion. Coronary artery bypass grafting has been performed. Cardiac size within normal limits. Pulmonary vascularity is normal. No acute bone abnormality. IMPRESSION: No active disease. Electronically Signed   By: Helyn Numbers M.D.   On: 02/02/2023 23:05    Microbiology: Results for orders placed or performed during the hospital encounter of 02/26/23  SARS Coronavirus 2 by RT PCR (hospital order, performed in [facility] hospital lab) *cepheid single result test* Anterior Nasal Swab     Status: Abnormal   Collection Time: 02/26/23  4:02 PM   Specimen: Anterior Nasal Swab  Result Value Ref Range Status   SARS Coronavirus 2 by RT PCR POSITIVE (A) NEGATIVE Final    Comment: Performed at [facility], [address], [place], [name] [address]    Labs: CBC: Recent Labs  Lab 02/26/23 1600 02/27/23 0522  WBC 8.1 8.7  NEUTROABS  --  6.6  HGB 12.1* 12.5*  HCT 38.9* 38.9*  MCV 93.1 90.3  PLT 196 190   Basic Metabolic Panel: Recent Labs  Lab 02/26/23 1600 02/26/23 2242 02/27/23 0522 02/28/23 1635  NA 133*  --  136  --   K 4.7  --  4.3  --   CL 101  --  103  --    CO2 23  --  25  --   GLUCOSE 352*  --  321*  --   BUN 34*  --  30*  --   CREATININE 1.91*  --  1.76*  --  CALCIUM 7.8*  --  8.3*  --   MG  --  2.0 2.0 1.8  PHOS  --   --  3.6  --    Liver Function Tests: Recent Labs  Lab 02/27/23 0522  AST 32  ALT 42  ALKPHOS 111  BILITOT 0.6  PROT 5.3*  ALBUMIN 2.7*   CBG: Recent Labs  Lab 02/28/23 1156 02/28/23 1310 02/28/23 1721 02/28/23 1957 03/01/23 0926  GLUCAP 298* 359* 385* 363* 234*    Discharge time spent: greater than 30 minutes.  Signed: Alba Cory, MD [facility] 03/01/2023

## 2023-03-03 LAB — T3, FREE: T3, Free: 2.1 pg/mL (ref 2.0–4.4)

## 2023-03-09 ENCOUNTER — Ambulatory Visit (INDEPENDENT_AMBULATORY_CARE_PROVIDER_SITE_OTHER): Payer: Medicare Other | Admitting: Podiatry

## 2023-03-09 ENCOUNTER — Encounter: Payer: Self-pay | Admitting: Podiatry

## 2023-03-09 DIAGNOSIS — M79674 Pain in right toe(s): Secondary | ICD-10-CM

## 2023-03-09 DIAGNOSIS — B351 Tinea unguium: Secondary | ICD-10-CM | POA: Diagnosis not present

## 2023-03-09 DIAGNOSIS — M79675 Pain in left toe(s): Secondary | ICD-10-CM

## 2023-03-09 DIAGNOSIS — E1151 Type 2 diabetes mellitus with diabetic peripheral angiopathy without gangrene: Secondary | ICD-10-CM | POA: Diagnosis not present

## 2023-03-09 NOTE — Progress Notes (Signed)
This patient returns to my office for at risk foot care.  This patient requires this care by a professional since this patient will be at risk due to having diabetes.  This patient is unable to cut nails himself since the patient cannot reach his nails.These nails are painful walking and wearing shoes. Patient presents to the office with his wife in a wheelchair. This patient presents for at risk foot care today.  General Appearance  Alert, conversant and in no acute stress.  Vascular  Dorsalis pedis and posterior tibial  pulses are  weakly palpable  bilaterally.  Capillary return is within normal limits  bilaterally. Temperature is within normal limits  bilaterally.  Neurologic  Senn-Weinstein monofilament wire test within normal limits  bilaterally. Muscle power within normal limits bilaterally.  Nails Thick disfigured discolored nails with subungual debris  from hallux to fifth toes bilaterally. No evidence of bacterial infection or drainage bilaterally.  Orthopedic  No limitations of motion  feet .  No crepitus or effusions noted.  No bony pathology or digital deformities noted.  Skin  normotropic skin with no porokeratosis noted bilaterally.  No signs of infections or ulcers noted.     Onychomycosis  Pain in right toes  Pain in left toes  Consent was obtained for treatment procedures.   Mechanical debridement of nails 1-5  bilaterally performed with a nail nipper.  Filed with dremel without incident.    Return office visit   3 months                   Told patient to return for periodic foot care and evaluation due to potential at risk complications.   Helane Gunther DPM

## 2023-03-15 ENCOUNTER — Emergency Department (HOSPITAL_COMMUNITY)
Admission: EM | Admit: 2023-03-15 | Discharge: 2023-03-15 | Disposition: A | Discharge status: Home / Self Care | Payer: Medicare Other | Attending: Emergency Medicine | Admitting: Emergency Medicine

## 2023-03-15 ENCOUNTER — Emergency Department (HOSPITAL_COMMUNITY): Payer: Medicare Other

## 2023-03-15 ENCOUNTER — Other Ambulatory Visit: Payer: Self-pay

## 2023-03-15 ENCOUNTER — Encounter (HOSPITAL_COMMUNITY): Payer: Self-pay

## 2023-03-15 DIAGNOSIS — R0602 Shortness of breath: Secondary | ICD-10-CM | POA: Diagnosis not present

## 2023-03-15 DIAGNOSIS — Z7901 Long term (current) use of anticoagulants: Secondary | ICD-10-CM | POA: Insufficient documentation

## 2023-03-15 DIAGNOSIS — W1839XA Other fall on same level, initial encounter: Secondary | ICD-10-CM | POA: Insufficient documentation

## 2023-03-15 DIAGNOSIS — R531 Weakness: Secondary | ICD-10-CM | POA: Diagnosis present

## 2023-03-15 LAB — CBC WITH DIFFERENTIAL/PLATELET
Abs Immature Granulocytes: 0.05 10*3/uL (ref 0.00–0.07)
Basophils Absolute: 0 10*3/uL (ref 0.0–0.1)
Basophils Relative: 0 %
Eosinophils Absolute: 0.1 10*3/uL (ref 0.0–0.5)
Eosinophils Relative: 1 %
HCT: 38.7 % — ABNORMAL LOW (ref 39.0–52.0)
Hemoglobin: 11.9 g/dL — ABNORMAL LOW (ref 13.0–17.0)
Immature Granulocytes: 1 %
Lymphocytes Relative: 13 %
Lymphs Abs: 1 10*3/uL (ref 0.7–4.0)
MCH: 29.3 pg (ref 26.0–34.0)
MCHC: 30.7 g/dL (ref 30.0–36.0)
MCV: 95.3 fL (ref 80.0–100.0)
Monocytes Absolute: 1.2 10*3/uL — ABNORMAL HIGH (ref 0.1–1.0)
Monocytes Relative: 15 %
Neutro Abs: 5.2 10*3/uL (ref 1.7–7.7)
Neutrophils Relative %: 70 %
Platelets: 147 10*3/uL — ABNORMAL LOW (ref 150–400)
RBC: 4.06 MIL/uL — ABNORMAL LOW (ref 4.22–5.81)
RDW: 16.3 % — ABNORMAL HIGH (ref 11.5–15.5)
WBC: 7.5 10*3/uL (ref 4.0–10.5)
nRBC: 0 % (ref 0.0–0.2)

## 2023-03-15 LAB — COMPREHENSIVE METABOLIC PANEL
ALT: 30 U/L (ref 0–44)
AST: 31 U/L (ref 15–41)
Albumin: 2.6 g/dL — ABNORMAL LOW (ref 3.5–5.0)
Alkaline Phosphatase: 92 U/L (ref 38–126)
Anion gap: 11 (ref 5–15)
BUN: 22 mg/dL (ref 8–23)
CO2: 21 mmol/L — ABNORMAL LOW (ref 22–32)
Calcium: 8 mg/dL — ABNORMAL LOW (ref 8.9–10.3)
Chloride: 107 mmol/L (ref 98–111)
Creatinine, Ser: 1.63 mg/dL — ABNORMAL HIGH (ref 0.61–1.24)
GFR, Estimated: 43 mL/min — ABNORMAL LOW (ref >60)
Glucose, Bld: 189 mg/dL — ABNORMAL HIGH (ref 70–99)
Potassium: 4.4 mmol/L (ref 3.5–5.1)
Sodium: 139 mmol/L (ref 135–145)
Total Bilirubin: 1.1 mg/dL (ref 0.3–1.2)
Total Protein: 5.2 g/dL — ABNORMAL LOW (ref 6.5–8.1)

## 2023-03-15 LAB — URINALYSIS, ROUTINE W REFLEX MICROSCOPIC
Bacteria, UA: NONE SEEN
Bilirubin Urine: NEGATIVE
Glucose, UA: NEGATIVE mg/dL
Hgb urine dipstick: NEGATIVE
Ketones, ur: NEGATIVE mg/dL
Nitrite: NEGATIVE
Protein, ur: 100 mg/dL — AB
Specific Gravity, Urine: 1.023 (ref 1.005–1.030)
pH: 5 (ref 5.0–8.0)

## 2023-03-15 LAB — TROPONIN I (HIGH SENSITIVITY)
Troponin I (High Sensitivity): 22 ng/L — ABNORMAL HIGH (ref <18)
Troponin I (High Sensitivity): 19 ng/L — ABNORMAL HIGH (ref <18)

## 2023-03-15 LAB — BRAIN NATRIURETIC PEPTIDE: B Natriuretic Peptide: 240.2 pg/mL — ABNORMAL HIGH (ref 0.0–100.0)

## 2023-03-15 NOTE — ED Notes (Signed)
Pt did well ambulating in hall. Pt stated he only walks from den to RR so he walked from his room 22 to the doctors offices right across from him. Pt did well when ask if pt was dizzy or SOB pt stated no. When got back to room pt hr 103 and o2 96.

## 2023-03-15 NOTE — ED Notes (Addendum)
Pt spouse states husband is trying to get placed at [facility] [phone]. He isn't able to go to the facility unless he   Last three days pt is u/t to complete his ADL's. Prior he was able to transfer to wheelchair, use walker, and cloth himself.  Spouse states she is u/t assist him at home.

## 2023-03-15 NOTE — ED Notes (Signed)
Ptar called by Ciana Simmon 

## 2023-03-15 NOTE — ED Triage Notes (Signed)
Pt BIB [facility] for c/o fall. Pt missed his walker and fell to the floor landing on his bottom. Pt on eliquis. Pt has fallen 3 times in 24 hours. Pt was not seen for last two falls. Pt reports increased weakness over the last few days and SHOB when he lays flat. A/O x3, hx of dementia. Wife reported to EMS that he had slurred speech at 0600 6/29, says it lasted for 10 minutes and then went away.  BP 132/85 HR 80 93% room air CBG 265

## 2023-03-15 NOTE — ED Notes (Signed)
Pt states he is interested in a facility off [address] but he was told he needs three shots.

## 2023-03-15 NOTE — Discharge Instructions (Addendum)
You have been seen and discharged from the emergency department.  Your workup was normal and stable for you.  Social work was consulted and had no further ability to aid your transition into [place] today.  A home face-to-face order has been placed for further care.  Follow-up with your primary provider for further evaluation and further care.  Primary doctor should be able to place TB skin test.  Take home medications as prescribed. If you have any worsening symptoms or further concerns for your health please return to an emergency department for further evaluation.

## 2023-03-15 NOTE — ED Provider Notes (Signed)
[place] EMERGENCY DEPARTMENT AT [facility] Provider Note   CSN: [id number] Arrival date & time: 03/15/23  1012     History  Chief Complaint  Patient presents with   Weakness   Fall    Fernando Hess is a 77 y.o. male.  HPI   77 year old male with multiple comorbidities presents emergency department with worsening weakness, shortness of breath, difficulty with ADLs at home.  Patient uses a walker.  Has had multiple falls secondary to weakness and difficulty using the walker.  Today he states he was standing with his spouse assisting him and when he turned back for the walker she had accidentally moved and he fell down onto his bottom.  He denies any hip pain or injury from the fall.  Did not hit his head, no loss of consciousness or syncope.  Patient states he feels generally weak and has shortness of breath mainly with laying flat at night.  He denies any fever, cough, ongoing chest/back pain, abdominal pain, vomiting/diarrhea, genitourinary symptoms.  Home Medications Prior to Admission medications   Medication Sig Start Date End Date Taking? Authorizing Provider  acetaminophen (TYLENOL) 500 MG tablet Take 1,000 mg by mouth every 6 (six) hours as needed for moderate pain.    [provider]  albuterol (VENTOLIN HFA) 108 (90 Base) MCG/ACT inhaler Inhale 2 puffs into the lungs every 4 (four) hours as needed for wheezing or shortness of breath. 02/25/23   [provider]  apixaban (ELIQUIS) 5 MG TABS tablet Take 1 tablet (5 mg total) by mouth 2 (two) times daily. 01/01/23   Lorin Glass, MD  ascorbic acid (VITAMIN C) 500 MG tablet Take 1 tablet (500 mg total) by mouth daily. 03/02/23   Regalado, Belkys A, MD  atorvastatin (LIPITOR) 80 MG tablet TAKE ONE TABLET BY MOUTH EVERY EVENING Patient taking differently: Take 80 mg by mouth at bedtime. 11/13/22   Tereso Newcomer T, [name]  benzonatate (TESSALON) 200 MG capsule Take 200 mg by mouth 3 (three) times daily as  needed for cough. 02/23/23   [provider]  budesonide (PULMICORT) 0.25 MG/2ML nebulizer solution Take 2 mLs (0.25 mg total) by nebulization 2 (two) times daily. 03/01/23   Regalado, Belkys A, MD  buPROPion (WELLBUTRIN XL) 300 MG 24 hr tablet Take 300 mg by mouth every morning. 02/19/23   [provider]  Continuous Glucose Sensor (FREESTYLE LIBRE 3 SENSOR) MISC 1 Application by Does not apply route daily. Place 1 sensor on the skin every 14 days. Use to check glucose continuously 03/01/23   Regalado, Belkys A, MD  cyanocobalamin (VITAMIN B12) 1000 MCG/ML injection Inject 1,000 mcg into the muscle once a week.    [provider]  DULoxetine (CYMBALTA) 30 MG capsule Take 1 capsule (30 mg total) by mouth every morning. 01/13/23   Rhetta Mura, MD  guaiFENesin-codeine 100-10 MG/5ML syrup Take 10 mLs by mouth every 4 (four) hours as needed for cough. 02/25/23   [provider]  hydrALAZINE (APRESOLINE) 25 MG tablet Take 1 tablet (25 mg total) by mouth every 6 (six) hours as needed (SBP>160 or DBP>110). 01/12/23   Rhetta Mura, MD  insulin lispro (HUMALOG) 100 UNIT/ML injection Inject 0-8 Units into the skin 3 (three) times daily. Sliding scale    [provider]  ketoconazole (NIZORAL) 2 % cream Apply 1 Application topically 2 (two) times daily as needed for irritation. 02/19/23   [provider]  LANTUS SOLOSTAR 100 UNIT/ML Solostar Pen Inject 40  Units into the skin daily. 02/19/23   [provider]  linagliptin (TRADJENTA) 5 MG TABS tablet Take 1 tablet (5 mg total) by mouth daily. 03/02/23   Regalado, Belkys A, MD  MAGNESIUM PO Take 1 tablet by mouth daily.    [provider]  Multiple Vitamins-Minerals (SENIOR MULTIVITAMIN PLUS PO) Take 1 tablet by mouth daily with breakfast.    [provider]  nitroGLYCERIN (NITROSTAT) 0.4 MG SL tablet Place 0.4 mg under the tongue every 5 (five) minutes as needed for chest pain.     [provider]  nystatin (MYCOSTATIN/NYSTOP) powder Apply 1 Application topically 2 (two) times daily as needed (skin irritation). Apply to the groin in the morning and evening    [provider]  ondansetron (ZOFRAN) 4 MG tablet Take 4 mg by mouth every 8 (eight) hours as needed for nausea or vomiting.    [provider]  oxyCODONE-acetaminophen (PERCOCET/ROXICET) 5-325 MG tablet Take 1 tablet by mouth every 6 (six) hours as needed for severe pain.    [provider]  pantoprazole (PROTONIX) 40 MG tablet TAKE 1 TABLET BY MOUTH EVERY DAY Patient taking differently: Take 40 mg by mouth in the morning. 11/25/19   [name], MD  polyethylene glycol (MIRALAX / GLYCOLAX) 17 g packet Take 17 g by mouth 2 (two) times daily as needed for mild constipation. 01/09/23   Almon Hercules, MD  QUEtiapine (SEROQUEL) 50 MG tablet Take 50 mg by mouth at bedtime. 02/23/23   [provider]  senna-docusate (SENOKOT-S) 8.6-50 MG tablet Take 1 tablet by mouth 2 (two) times daily as needed for moderate constipation. 01/09/23   Almon Hercules, MD  tamsulosin (FLOMAX) 0.4 MG CAPS capsule Take 1 capsule (0.4 mg total) by mouth daily after breakfast. Patient taking differently: Take 0.4 mg by mouth at bedtime. 01/02/23   Lorin Glass, MD      Allergies    Cilostazol, Dulaglutide, Levofloxacin, Liraglutide, and Lisinopril    Review of Systems   Review of Systems  Constitutional:  Positive for appetite change and fatigue. Negative for fever.  Respiratory:  Negative for shortness of breath.   Cardiovascular:  Negative for chest pain.  Gastrointestinal:  Negative for abdominal pain, diarrhea and vomiting.  Musculoskeletal:  Negative for back pain and neck pain.  Skin:  Negative for rash.  Neurological:  Negative for dizziness, syncope and headaches.    Physical Exam Updated Vital Signs BP (!) 138/107   Pulse 83   Temp 99.2 F (37.3 C) (Oral)   Resp 17   Ht 5\' 6"  (1.676  m)   Wt 74.8 kg   SpO2 98%   BMI 26.63 kg/m  Physical Exam Vitals and nursing note reviewed.  Constitutional:      General: He is not in acute distress.    Appearance: Normal appearance.  HENT:     Head: Normocephalic.     Mouth/Throat:     Mouth: Mucous membranes are moist.  Eyes:     Extraocular Movements: Extraocular movements intact.     Pupils: Pupils are equal, round, and reactive to light.  Cardiovascular:     Rate and Rhythm: Normal rate.  Pulmonary:     Effort: Pulmonary effort is normal. No respiratory distress.  Abdominal:     Palpations: Abdomen is soft.     Tenderness: There is no abdominal tenderness.  Musculoskeletal:        General: No swelling or deformity.     Cervical  back: Neck supple.     Comments: No tenderness to palpation of the hip/pelvis with palpation and pelvis rocking, legs are the same length and not abnormally rotated  Skin:    General: Skin is warm.  Neurological:     Mental Status: He is alert and oriented to person, place, and time. Mental status is at baseline.  Psychiatric:        Mood and Affect: Mood normal.     ED Results / Procedures / Treatments   Labs (all labs ordered are listed, but only abnormal results are displayed) Labs Reviewed  CBC WITH DIFFERENTIAL/PLATELET - Abnormal; Notable for the following components:      Result Value   RBC 4.06 (*)    Hemoglobin 11.9 (*)    HCT 38.7 (*)    RDW 16.3 (*)    Platelets 147 (*)    Monocytes Absolute 1.2 (*)    All other components within normal limits  COMPREHENSIVE METABOLIC PANEL - Abnormal; Notable for the following components:   CO2 21 (*)    Glucose, Bld 189 (*)    Creatinine, Ser 1.63 (*)    Calcium 8.0 (*)    Total Protein 5.2 (*)    Albumin 2.6 (*)    GFR, Estimated 43 (*)    All other components within normal limits  BRAIN NATRIURETIC PEPTIDE - Abnormal; Notable for the following components:   B Natriuretic Peptide 240.2 (*)    All other components within normal  limits  URINALYSIS, ROUTINE W REFLEX MICROSCOPIC - Abnormal; Notable for the following components:   Protein, ur 100 (*)    Leukocytes,Ua TRACE (*)    All other components within normal limits  TROPONIN I (HIGH SENSITIVITY) - Abnormal; Notable for the following components:   Troponin I (High Sensitivity) 22 (*)    All other components within normal limits  TROPONIN I (HIGH SENSITIVITY)    EKG EKG Interpretation Date/Time:  Sunday March 15 2023 10:21:29 EDT Ventricular Rate:  80 PR Interval:    QRS Duration:  125 QT Interval:  481 QTC Calculation: 555 R Axis:   29  Text Interpretation: Atrial fibrillation Right bundle branch block rbbb old Confirmed by Coralee Pesa [phone]) on 03/15/2023 1:05:43 PM  Radiology DG Chest Port 1 View  Result Date: 03/15/2023 CLINICAL DATA:  Shortness of breath EXAM: PORTABLE CHEST 1 VIEW COMPARISON:  02/26/2023 FINDINGS: Status post median sternotomy. No consolidation, pneumothorax or effusion. No edema. Normal cardiopericardial silhouette. Tortuous ectatic aorta. Overlapping cardiac leads. IMPRESSION: Postop chest.  No acute cardiopulmonary disease. Electronically Signed   By: Karen Kays M.D.   On: 03/15/2023 11:55    Procedures Procedures    Medications Ordered in ED Medications - No data to display  ED Course/ Medical Decision Making/ A&P                             Medical Decision Making Amount and/or Complexity of Data Reviewed Labs: ordered. Radiology: ordered.   77 year old male presents emergency department with worsening weakness and shortness of breath when lying flat.  Patient is currently in the process of transitioning himself to [facility] for further assistance.  He had a fall today when him and his wife was handling the walker.  He denies any traumatic injury from the fall.  There is no head injury or loss of consciousness.  He has no headache or other symptoms.  Vitals are stable on arrival, he is  well-appearing.  Workup  shows no acute findings. Cr is baseline.  EKG is baseline (old right bundle branch block and A-fib), chest x-ray shows no acute findings.  Troponin is just barely elevated but flat with no active chest pain or EKG changes.  No findings of CHF exacerbation, BNP only 240.  Consulted social work here who had no further recommendations for faster transition into [facility].  He is pending TB skin testing and other preadmission work by the primary doctor.  There is no indication for emergent admission at this time.  Patient at this time appears safe and stable for discharge and close outpatient follow up. Discharge plan and strict return to ED precautions discussed, patient verbalizes understanding and agreement.        Final Clinical Impression(s) / ED Diagnoses Final diagnoses:  None    Rx / DC Orders ED Discharge Orders     None         Rozelle Logan, DO 03/15/23 1528

## 2023-03-15 NOTE — Progress Notes (Signed)
CSW received consult for patient for ALF placement.  CSW spoke with MD who states patient was advised he needs additional immunizations before he can be admitted to [facility] [place]. CSW informed MD that patient would be required to receive those immunizations prior to placement there and that CSW is unable to place him there until that is completed. CSW suggested patient return home with his wife, follow up with his PCP for immunizations, and contact [facility] staff once ready for admission into the facility.  Edwin Dada, MSW, LCSW Transitions of Care  Clinical Social Worker II [phone]

## 2023-03-15 NOTE — ED Provider Notes (Signed)
Patient requires PTAR to go home because he is unable to ambulate safely on his own or from a car into his house.   Gwyneth Sprout, MD 03/15/23 [phone]

## 2023-03-24 ENCOUNTER — Emergency Department (HOSPITAL_COMMUNITY)
Admission: EM | Admit: 2023-03-24 | Discharge: 2023-03-25 | Disposition: A | Discharge status: Home / Self Care | Payer: Medicare Other | Attending: Emergency Medicine | Admitting: Emergency Medicine

## 2023-03-24 ENCOUNTER — Encounter (HOSPITAL_COMMUNITY): Payer: Self-pay

## 2023-03-24 DIAGNOSIS — Z7901 Long term (current) use of anticoagulants: Secondary | ICD-10-CM | POA: Insufficient documentation

## 2023-03-24 DIAGNOSIS — E162 Hypoglycemia, unspecified: Secondary | ICD-10-CM | POA: Diagnosis present

## 2023-03-24 DIAGNOSIS — S80212A Abrasion, left knee, initial encounter: Secondary | ICD-10-CM | POA: Diagnosis not present

## 2023-03-24 DIAGNOSIS — S80211A Abrasion, right knee, initial encounter: Secondary | ICD-10-CM | POA: Insufficient documentation

## 2023-03-24 DIAGNOSIS — W1830XA Fall on same level, unspecified, initial encounter: Secondary | ICD-10-CM | POA: Insufficient documentation

## 2023-03-24 DIAGNOSIS — Z794 Long term (current) use of insulin: Secondary | ICD-10-CM | POA: Insufficient documentation

## 2023-03-24 DIAGNOSIS — E11649 Type 2 diabetes mellitus with hypoglycemia without coma: Secondary | ICD-10-CM | POA: Insufficient documentation

## 2023-03-24 LAB — COMPREHENSIVE METABOLIC PANEL
ALT: 34 U/L (ref 0–44)
AST: 31 U/L (ref 15–41)
Albumin: 2.8 g/dL — ABNORMAL LOW (ref 3.5–5.0)
Alkaline Phosphatase: 109 U/L (ref 38–126)
Anion gap: 8 (ref 5–15)
BUN: 23 mg/dL (ref 8–23)
CO2: 23 mmol/L (ref 22–32)
Calcium: 8.3 mg/dL — ABNORMAL LOW (ref 8.9–10.3)
Chloride: 110 mmol/L (ref 98–111)
Creatinine, Ser: 1.79 mg/dL — ABNORMAL HIGH (ref 0.61–1.24)
GFR, Estimated: 38 mL/min — ABNORMAL LOW (ref >60)
Glucose, Bld: 112 mg/dL — ABNORMAL HIGH (ref 70–99)
Potassium: 4.2 mmol/L (ref 3.5–5.1)
Sodium: 141 mmol/L (ref 135–145)
Total Bilirubin: 0.6 mg/dL (ref 0.3–1.2)
Total Protein: 5.5 g/dL — ABNORMAL LOW (ref 6.5–8.1)

## 2023-03-24 LAB — CBC WITH DIFFERENTIAL/PLATELET
Abs Immature Granulocytes: 0.12 10*3/uL — ABNORMAL HIGH (ref 0.00–0.07)
Basophils Absolute: 0.1 10*3/uL (ref 0.0–0.1)
Basophils Relative: 1 %
Eosinophils Absolute: 0.2 10*3/uL (ref 0.0–0.5)
Eosinophils Relative: 3 %
HCT: 40.8 % (ref 39.0–52.0)
Hemoglobin: 12.5 g/dL — ABNORMAL LOW (ref 13.0–17.0)
Immature Granulocytes: 2 %
Lymphocytes Relative: 21 %
Lymphs Abs: 1.6 10*3/uL (ref 0.7–4.0)
MCH: 29.3 pg (ref 26.0–34.0)
MCHC: 30.6 g/dL (ref 30.0–36.0)
MCV: 95.8 fL (ref 80.0–100.0)
Monocytes Absolute: 0.8 10*3/uL (ref 0.1–1.0)
Monocytes Relative: 11 %
Neutro Abs: 4.7 10*3/uL (ref 1.7–7.7)
Neutrophils Relative %: 62 %
Platelets: 258 10*3/uL (ref 150–400)
RBC: 4.26 MIL/uL (ref 4.22–5.81)
RDW: 15.9 % — ABNORMAL HIGH (ref 11.5–15.5)
WBC: 7.5 10*3/uL (ref 4.0–10.5)
nRBC: 0 % (ref 0.0–0.2)

## 2023-03-24 LAB — URINALYSIS, W/ REFLEX TO CULTURE (INFECTION SUSPECTED)
Bacteria, UA: NONE SEEN
Bilirubin Urine: NEGATIVE
Glucose, UA: >=500 mg/dL — AB
Hgb urine dipstick: NEGATIVE
Ketones, ur: NEGATIVE mg/dL
Leukocytes,Ua: NEGATIVE
Nitrite: NEGATIVE
Protein, ur: 100 mg/dL — AB
Specific Gravity, Urine: 1.022 (ref 1.005–1.030)
pH: 5 (ref 5.0–8.0)

## 2023-03-24 LAB — CBG MONITORING, ED: Glucose-Capillary: 122 mg/dL — ABNORMAL HIGH (ref 70–99)

## 2023-03-24 NOTE — Discharge Instructions (Signed)
Return for any problem.  ?

## 2023-03-24 NOTE — ED Provider Notes (Signed)
[name] EMERGENCY DEPARTMENT AT [facility] Provider Note   CSN: [id number] Arrival date & time: 03/24/23  1614     History {Add pertinent medical, surgical, social history, OB history to HPI:1} Chief Complaint  Patient presents with   Hypoglycemia    Fernando Hess is a 77 y.o. male.  77 year old male with prior medical history as detailed below presents for evaluation.  Patient with history of myasthenia gravis and frequent falls.  Per EMS patient was hypoglycemic this morning.  Patient was transported to the ED for evaluation of same.  On arrival patient's sugar was 110.  Patient is without complaint at time of this provider's evaluation.  The history is provided by the patient and medical records.       Home Medications Prior to Admission medications   Medication Sig Start Date End Date Taking? Authorizing Provider  acetaminophen (TYLENOL) 500 MG tablet Take 1,000 mg by mouth every 6 (six) hours as needed for moderate pain.    [provider]  albuterol (VENTOLIN HFA) 108 (90 Base) MCG/ACT inhaler Inhale 2 puffs into the lungs every 4 (four) hours as needed for wheezing or shortness of breath. 02/25/23   [provider]  apixaban (ELIQUIS) 5 MG TABS tablet Take 1 tablet (5 mg total) by mouth 2 (two) times daily. 01/01/23   Lorin Glass, MD  ascorbic acid (VITAMIN C) 500 MG tablet Take 1 tablet (500 mg total) by mouth daily. 03/02/23   Regalado, Belkys A, MD  atorvastatin (LIPITOR) 80 MG tablet TAKE ONE TABLET BY MOUTH EVERY EVENING Patient taking differently: Take 80 mg by mouth at bedtime. 11/13/22   Tereso Newcomer T, [name]  benzonatate (TESSALON) 200 MG capsule Take 200 mg by mouth 3 (three) times daily as needed for cough. 02/23/23   [provider]  budesonide (PULMICORT) 0.25 MG/2ML nebulizer solution Take 2 mLs (0.25 mg total) by nebulization 2 (two) times daily. 03/01/23   Regalado, Belkys A, MD  buPROPion (WELLBUTRIN XL) 300 MG 24 hr  tablet Take 300 mg by mouth every morning. 02/19/23   [provider]  Continuous Glucose Sensor (FREESTYLE LIBRE 3 SENSOR) MISC 1 Application by Does not apply route daily. Place 1 sensor on the skin every 14 days. Use to check glucose continuously 03/01/23   Regalado, Belkys A, MD  cyanocobalamin (VITAMIN B12) 1000 MCG/ML injection Inject 1,000 mcg into the muscle once a week.    [provider]  DULoxetine (CYMBALTA) 30 MG capsule Take 1 capsule (30 mg total) by mouth every morning. 01/13/23   Rhetta Mura, MD  guaiFENesin-codeine 100-10 MG/5ML syrup Take 10 mLs by mouth every 4 (four) hours as needed for cough. 02/25/23   [provider]  hydrALAZINE (APRESOLINE) 25 MG tablet Take 1 tablet (25 mg total) by mouth every 6 (six) hours as needed (SBP>160 or DBP>110). 01/12/23   Rhetta Mura, MD  insulin lispro (HUMALOG) 100 UNIT/ML injection Inject 0-8 Units into the skin 3 (three) times daily. Sliding scale    [provider]  ketoconazole (NIZORAL) 2 % cream Apply 1 Application topically 2 (two) times daily as needed for irritation. 02/19/23   [provider]  LANTUS SOLOSTAR 100 UNIT/ML Solostar Pen Inject 40 Units into the skin daily. 02/19/23   [provider]  linagliptin (TRADJENTA) 5 MG TABS tablet Take 1 tablet (5 mg total) by mouth daily. 03/02/23   Regalado, Belkys A, MD  MAGNESIUM PO Take 1 tablet by mouth daily.  [provider]  Multiple Vitamins-Minerals (SENIOR MULTIVITAMIN PLUS PO) Take 1 tablet by mouth daily with breakfast.    [provider]  nitroGLYCERIN (NITROSTAT) 0.4 MG SL tablet Place 0.4 mg under the tongue every 5 (five) minutes as needed for chest pain.    [provider]  nystatin (MYCOSTATIN/NYSTOP) powder Apply 1 Application topically 2 (two) times daily as needed (skin irritation). Apply to the groin in the morning and evening    [provider]  ondansetron (ZOFRAN) 4 MG  tablet Take 4 mg by mouth every 8 (eight) hours as needed for nausea or vomiting.    [provider]  oxyCODONE-acetaminophen (PERCOCET/ROXICET) 5-325 MG tablet Take 1 tablet by mouth every 6 (six) hours as needed for severe pain.    [provider]  pantoprazole (PROTONIX) 40 MG tablet TAKE 1 TABLET BY MOUTH EVERY DAY Patient taking differently: Take 40 mg by mouth in the morning. 11/25/19   [name], MD  polyethylene glycol (MIRALAX / GLYCOLAX) 17 g packet Take 17 g by mouth 2 (two) times daily as needed for mild constipation. 01/09/23   Almon Hercules, MD  QUEtiapine (SEROQUEL) 50 MG tablet Take 50 mg by mouth at bedtime. 02/23/23   [provider]  senna-docusate (SENOKOT-S) 8.6-50 MG tablet Take 1 tablet by mouth 2 (two) times daily as needed for moderate constipation. 01/09/23   Almon Hercules, MD  tamsulosin (FLOMAX) 0.4 MG CAPS capsule Take 1 capsule (0.4 mg total) by mouth daily after breakfast. Patient taking differently: Take 0.4 mg by mouth at bedtime. 01/02/23   Lorin Glass, MD      Allergies    Cilostazol, Dulaglutide, Levofloxacin, Liraglutide, and Lisinopril    Review of Systems   Review of Systems  All other systems reviewed and are negative.   Physical Exam Updated Vital Signs BP (!) 155/100   Pulse 93   Temp 98.3 F (36.8 C)   Resp 16   Ht 5\' 6"  (1.676 m)   Wt 74.8 kg   SpO2 96%   BMI 26.62 kg/m  Physical Exam Vitals and nursing note reviewed.  Constitutional:      General: He is not in acute distress.    Appearance: Normal appearance. He is well-developed.  HENT:     Head: Normocephalic and atraumatic.  Eyes:     Conjunctiva/sclera: Conjunctivae normal.     Pupils: Pupils are equal, round, and reactive to light.  Cardiovascular:     Rate and Rhythm: Normal rate and regular rhythm.     Heart sounds: Normal heart sounds.  Pulmonary:     Effort: Pulmonary effort is normal. No respiratory distress.     Breath sounds: Normal  breath sounds.  Abdominal:     General: There is no distension.     Palpations: Abdomen is soft.     Tenderness: There is no abdominal tenderness.  Musculoskeletal:        General: No deformity. Normal range of motion.     Cervical back: Normal range of motion and neck supple.  Skin:    General: Skin is warm and dry.     Comments: Superficial abrasions to bilateral anterior knees status post recent fall.  Full active range of motion of bilateral knees.  Bilateral knees are stable.  Distal bilateral lower extremities are neurovascular tact.  Neurological:     General: No focal deficit present.     Mental Status: He is alert and oriented to person, place, and  time.     ED Results / Procedures / Treatments   Labs (all labs ordered are listed, but only abnormal results are displayed) Labs Reviewed  CBC WITH DIFFERENTIAL/PLATELET - Abnormal; Notable for the following components:      Result Value   Hemoglobin 12.5 (*)    RDW 15.9 (*)    Abs Immature Granulocytes 0.12 (*)    All other components within normal limits  COMPREHENSIVE METABOLIC PANEL - Abnormal; Notable for the following components:   Glucose, Bld 112 (*)    Creatinine, Ser 1.79 (*)    Calcium 8.3 (*)    Total Protein 5.5 (*)    Albumin 2.8 (*)    GFR, Estimated 38 (*)    All other components within normal limits  CBG MONITORING, ED - Abnormal; Notable for the following components:   Glucose-Capillary 122 (*)    All other components within normal limits  CBG MONITORING, ED    EKG EKG Interpretation Date/Time:  Tuesday March 24 2023 16:31:37 EDT Ventricular Rate:  80 PR Interval:    QRS Duration:  80 QT Interval:  410 QTC Calculation: 472 R Axis:   -9  Text Interpretation: Atrial flutter with variable A-V block Abnormal ECG When compared with ECG of 15-Mar-2023 10:21, PREVIOUS ECG IS PRESENT Confirmed by Kristine Royal [phone]) on 03/24/2023 6:30:13 PM  Radiology No results found.  Procedures Procedures   {Document cardiac monitor, telemetry assessment procedure when appropriate:1}  Medications Ordered in ED Medications - No data to display  ED Course/ Medical Decision Making/ A&P   {   Click here for ABCD2, HEART and other calculatorsREFRESH Note before signing :1}                          Medical Decision Making   Medical Screen Complete  This patient presented to the ED with complaint of ***.  This complaint involves an extensive number of treatment options. The initial differential diagnosis includes, but is not limited to, ***  This presentation is: {IllnessRisk:19196::"***","Acute","Chronic","Self-Limited","Previously Undiagnosed","Uncertain Prognosis","Complicated","Systemic Symptoms","Threat to Life/Bodily Function"}    Co morbidities that complicated the patient's evaluation  ***   Additional history obtained:  Additional history obtained from {History source:19196::"EMS","Spouse","Family","Friend","Caregiver"} External records from outside sources obtained and reviewed including prior ED visits and prior Inpatient records.    Lab Tests:  I ordered and personally interpreted labs.  The pertinent results include:  ***   Imaging Studies ordered:  I ordered imaging studies including ***  I independently visualized and interpreted obtained imaging which showed *** I agree with the radiologist interpretation.   Cardiac Monitoring:  The patient was maintained on a cardiac monitor.  I personally viewed and interpreted the cardiac monitor which showed an underlying rhythm of: ***   Medicines ordered:  I ordered medication including ***  for ***  Reevaluation of the patient after these medicines showed that the patient: {resolved/improved/worsened:23923::"improved"}    Test Considered:  ***   Critical Interventions:  ***   Consultations Obtained:  I consulted ***,  and discussed lab and imaging findings as well as pertinent plan of care.     Problem List / ED Course:  ***   Reevaluation:  After the interventions noted above, I reevaluated the patient and found that they have: {resolved/improved/worsened:23923::"improved"}   Social Determinants of Health:  ***   Disposition:  After consideration of the diagnostic results and the patients response to treatment, I feel that the patent would benefit from ***.    {  Document critical care time when appropriate:1} {Document review of labs and clinical decision tools ie heart score, Chads2Vasc2 etc:1}  {Document your independent review of radiology images, and any outside records:1} {Document your discussion with family members, caretakers, and with consultants:1} {Document social determinants of health affecting pt's care:1} {Document your decision making why or why not admission, treatments were needed:1} Final Clinical Impression(s) / ED Diagnoses Final diagnoses:  None    Rx / DC Orders ED Discharge Orders     None

## 2023-03-24 NOTE — ED Provider Triage Note (Signed)
Emergency Medicine Provider Triage Evaluation Note  Fernando Hess , a 77 y.o. male  was evaluated in triage.  Pt complains of weakness.  Patient checked his glucose at 1 AM this morning which was 69.  His home health nurse rechecked glucose early this morning and also found patient to be hypoglycemic and called EMS.  Upon EMS arrival, patient's glucose was 110 without any intervention.  Patient admits to generalized weakness.  Frequent falls.  History of same.  Has a history of myasthenia gravis.  No chest pain or shortness of breath.  Denies abdominal pain.  Patient notes he has been eating and drinking normally.  No recent insulin changes.  Review of Systems  Positive: weakness Negative: fever  Physical Exam  BP (!) 155/100   Pulse 93   Temp 98.3 F (36.8 C)   Resp 16   Ht 5\' 6"  (1.676 m)   Wt 74.8 kg   SpO2 96%   BMI 26.62 kg/m  Gen:   Awake, no distress   Resp:  Normal effort  MSK:   Moves extremities without difficulty  Other:    Medical Decision Making  Medically screening exam initiated at 4:26 PM.  Appropriate orders placed.  [name] was informed that the remainder of the evaluation will be completed by another provider, this initial triage assessment does not replace that evaluation, and the importance of remaining in the ED until their evaluation is complete.  Routine labs CBG checked in triage which was [address]   Mannie Stabile, [place] 03/24/23 1628

## 2023-03-24 NOTE — ED Triage Notes (Signed)
Hx of DM. Pt reports CBG dropped to 58 at home per Home Health RN. Sent here for further eval. EMS reports last CBG was 110 with no intervention. Pt reports eating breakfast this AM and took his insulin. Alert and oriented x 4.

## 2023-03-25 LAB — CBG MONITORING, ED: Glucose-Capillary: 201 mg/dL — ABNORMAL HIGH (ref 70–99)

## 2023-03-25 NOTE — ED Notes (Addendum)
Pt provided with sandwich and snack per request.  Pt informed of need for urine sample when able to void.  Pt updated that this RN or tech will come back to check fingerstick in a bit.

## 2023-03-25 NOTE — ED Notes (Signed)
PT[facility] present to take pt home.

## 2023-03-25 NOTE — ED Notes (Signed)
Pt provided with AVS.  Education complete; all questions answered.  Pt leaving ED in stable condition at this time via [facility] transport with all belongings.

## 2023-03-25 NOTE — ED Notes (Signed)
Repeat finger stick performed by tech and currently 201.  Provider updated on same.  Reading did not transfer to chart from meter.

## 2023-03-25 NOTE — ED Notes (Signed)
PTAR called, no eta 

## 2023-04-05 ENCOUNTER — Emergency Department (HOSPITAL_COMMUNITY): Payer: Medicare Other

## 2023-04-05 ENCOUNTER — Encounter (HOSPITAL_COMMUNITY): Payer: Self-pay | Admitting: Radiology

## 2023-04-05 ENCOUNTER — Emergency Department (HOSPITAL_COMMUNITY)
Admission: EM | Admit: 2023-04-05 | Discharge: 2023-04-08 | Disposition: A | Discharge status: Skilled Nursing Facility | Payer: Medicare Other | Attending: Emergency Medicine | Admitting: Emergency Medicine

## 2023-04-05 ENCOUNTER — Other Ambulatory Visit: Payer: Self-pay

## 2023-04-05 DIAGNOSIS — W19XXXA Unspecified fall, initial encounter: Secondary | ICD-10-CM | POA: Insufficient documentation

## 2023-04-05 DIAGNOSIS — E119 Type 2 diabetes mellitus without complications: Secondary | ICD-10-CM | POA: Insufficient documentation

## 2023-04-05 DIAGNOSIS — Z794 Long term (current) use of insulin: Secondary | ICD-10-CM | POA: Insufficient documentation

## 2023-04-05 DIAGNOSIS — I2510 Atherosclerotic heart disease of native coronary artery without angina pectoris: Secondary | ICD-10-CM | POA: Diagnosis not present

## 2023-04-05 DIAGNOSIS — M79605 Pain in left leg: Secondary | ICD-10-CM | POA: Diagnosis present

## 2023-04-05 DIAGNOSIS — Z951 Presence of aortocoronary bypass graft: Secondary | ICD-10-CM | POA: Diagnosis not present

## 2023-04-05 DIAGNOSIS — I6782 Cerebral ischemia: Secondary | ICD-10-CM | POA: Insufficient documentation

## 2023-04-05 DIAGNOSIS — Z7901 Long term (current) use of anticoagulants: Secondary | ICD-10-CM | POA: Insufficient documentation

## 2023-04-05 DIAGNOSIS — I4891 Unspecified atrial fibrillation: Secondary | ICD-10-CM | POA: Insufficient documentation

## 2023-04-05 DIAGNOSIS — M47812 Spondylosis without myelopathy or radiculopathy, cervical region: Secondary | ICD-10-CM | POA: Insufficient documentation

## 2023-04-05 DIAGNOSIS — R296 Repeated falls: Secondary | ICD-10-CM

## 2023-04-05 LAB — CBG MONITORING, ED
Glucose-Capillary: 171 mg/dL — ABNORMAL HIGH (ref 70–99)
Glucose-Capillary: 129 mg/dL — ABNORMAL HIGH (ref 70–99)

## 2023-04-05 MED ORDER — MIRABEGRON ER 25 MG PO TB24
25.0000 mg | ORAL_TABLET | Freq: Every day | ORAL | Status: DC
  Administered 2023-04-06 – 2023-04-08 (×2): 25 mg via ORAL
  Filled 2023-04-05 (×3): qty 1

## 2023-04-05 MED ORDER — ATORVASTATIN CALCIUM 40 MG PO TABS: 80.0000 mg | ORAL_TABLET | Freq: Every day | ORAL | Status: DC

## 2023-04-05 MED ORDER — INSULIN ASPART 100 UNIT/ML IJ SOLN
0.0000 [IU] | Freq: Every day | INTRAMUSCULAR | Status: DC
  Administered 2023-04-07: 5 [IU] via SUBCUTANEOUS
  Filled 2023-04-05: qty 0.05

## 2023-04-05 MED ORDER — QUETIAPINE FUMARATE 50 MG PO TABS: 50.0000 mg | ORAL_TABLET | Freq: Every day | ORAL | Status: DC

## 2023-04-05 MED ORDER — VITAMIN C 500 MG PO TABS
500.0000 mg | ORAL_TABLET | Freq: Every day | ORAL | Status: DC
  Administered 2023-04-05 – 2023-04-08 (×4): 500 mg via ORAL
  Filled 2023-04-05 (×4): qty 1

## 2023-04-05 MED ORDER — APIXABAN 5 MG PO TABS: 5.0000 mg | ORAL_TABLET | Freq: Two times a day (BID) | ORAL | Status: DC

## 2023-04-05 MED ORDER — TAMSULOSIN HCL 0.4 MG PO CAPS
0.4000 mg | ORAL_CAPSULE | Freq: Every day | ORAL | Status: DC
  Administered 2023-04-06 – 2023-04-07 (×2): 0.4 mg via ORAL
  Filled 2023-04-05 (×2): qty 1

## 2023-04-05 MED ORDER — DULOXETINE HCL 30 MG PO CPEP
30.0000 mg | ORAL_CAPSULE | Freq: Every morning | ORAL | Status: DC
  Administered 2023-04-05 – 2023-04-08 (×4): 30 mg via ORAL
  Filled 2023-04-05 (×4): qty 1

## 2023-04-05 MED ORDER — TAMSULOSIN HCL 0.4 MG PO CAPS: 0.4000 mg | ORAL_CAPSULE | Freq: Every day | ORAL | Status: DC

## 2023-04-05 MED ORDER — INSULIN ASPART 100 UNIT/ML IJ SOLN
0.0000 [IU] | Freq: Three times a day (TID) | INTRAMUSCULAR | Status: DC
  Administered 2023-04-05: 3 [IU] via SUBCUTANEOUS
  Administered 2023-04-06: 8 [IU] via SUBCUTANEOUS
  Administered 2023-04-06 (×2): 3 [IU] via SUBCUTANEOUS
  Administered 2023-04-07 (×2): 11 [IU] via SUBCUTANEOUS
  Administered 2023-04-08: 3 [IU] via SUBCUTANEOUS
  Filled 2023-04-05: qty 0.15

## 2023-04-05 MED ORDER — ACETAMINOPHEN 500 MG PO TABS: 1000.0000 mg | ORAL_TABLET | Freq: Four times a day (QID) | ORAL | Status: DC | PRN

## 2023-04-05 MED ORDER — PANTOPRAZOLE SODIUM 40 MG PO TBEC
40.0000 mg | DELAYED_RELEASE_TABLET | Freq: Every day | ORAL | Status: DC
  Administered 2023-04-05 – 2023-04-08 (×4): 40 mg via ORAL
  Filled 2023-04-05 (×4): qty 1

## 2023-04-05 MED ORDER — LINAGLIPTIN 5 MG PO TABS
5.0000 mg | ORAL_TABLET | Freq: Every day | ORAL | Status: DC
  Administered 2023-04-06 – 2023-04-08 (×2): 5 mg via ORAL
  Filled 2023-04-05 (×3): qty 1

## 2023-04-05 MED ORDER — LINAGLIPTIN 5 MG PO TABS: 5.0000 mg | ORAL_TABLET | Freq: Every day | ORAL | Status: DC

## 2023-04-05 MED ORDER — APIXABAN 5 MG PO TABS
5.0000 mg | ORAL_TABLET | Freq: Two times a day (BID) | ORAL | Status: DC
  Administered 2023-04-05 – 2023-04-08 (×6): 5 mg via ORAL
  Filled 2023-04-05 (×6): qty 1

## 2023-04-05 MED ORDER — OXYCODONE-ACETAMINOPHEN 5-325 MG PO TABS
1.0000 | ORAL_TABLET | Freq: Once | ORAL | Status: AC
  Administered 2023-04-05: 1 via ORAL
  Filled 2023-04-05: qty 1

## 2023-04-05 MED ORDER — POLYETHYLENE GLYCOL 3350 17 G PO PACK: 17.0000 g | PACK | Freq: Two times a day (BID) | ORAL | Status: DC | PRN

## 2023-04-05 MED ORDER — ACETAMINOPHEN 325 MG PO TABS
650.0000 mg | ORAL_TABLET | ORAL | Status: DC | PRN
  Administered 2023-04-05 (×2): 650 mg via ORAL
  Filled 2023-04-05 (×2): qty 2

## 2023-04-05 MED ORDER — ALBUTEROL SULFATE HFA 108 (90 BASE) MCG/ACT IN AERS: 2.0000 | INHALATION_SPRAY | RESPIRATORY_TRACT | Status: DC | PRN

## 2023-04-05 MED ORDER — SENNOSIDES-DOCUSATE SODIUM 8.6-50 MG PO TABS: 1.0000 | ORAL_TABLET | Freq: Two times a day (BID) | ORAL | Status: DC | PRN

## 2023-04-05 MED ORDER — BUDESONIDE 0.25 MG/2ML IN SUSP
0.2500 mg | Freq: Two times a day (BID) | RESPIRATORY_TRACT | Status: DC
  Administered 2023-04-05: 0.25 mg via RESPIRATORY_TRACT
  Filled 2023-04-05: qty 2

## 2023-04-05 MED ORDER — BUPROPION HCL ER (XL) 150 MG PO TB24
300.0000 mg | ORAL_TABLET | Freq: Every morning | ORAL | Status: DC
  Administered 2023-04-05 – 2023-04-08 (×4): 300 mg via ORAL
  Filled 2023-04-05 (×4): qty 2

## 2023-04-05 MED ORDER — HYDRALAZINE HCL 25 MG PO TABS
25.0000 mg | ORAL_TABLET | Freq: Four times a day (QID) | ORAL | Status: DC | PRN
  Administered 2023-04-05 – 2023-04-06 (×2): 25 mg via ORAL
  Filled 2023-04-05 (×2): qty 1

## 2023-04-05 NOTE — Progress Notes (Signed)
Awaiting PT eval.  

## 2023-04-05 NOTE — ED Notes (Addendum)
Pt's wife called stating that she cannot take care of patient and so he cannot come back home and needs to be sent to rehab. She wanted to speak with a provider and so I had her speak with attending provider.

## 2023-04-05 NOTE — Progress Notes (Addendum)
Transition of Care [facility]) - Emergency Department Mini Assessment   Patient Details  Name: Fernando Hess MRN: [id number] Date of Birth: May 22, 1946  Transition of Care [facility]) CM/SW Contact:    Princella Ion, LCSW Phone Number: 04/05/2023, 1:35 PM   Clinical Narrative: This CSW outreached to pt's spouse to discuss SNF process. Per chart review, pt has frequent visits to [facility] and two notable admissions (5/20 and 6/13) where SNF was pursued. Pt was worked up for SNF and wife did not like bed offers and opted to take pt home. On a separate admission, pt had used all 100 Medicare days and did not have the 60 day Wellness Period. Spouse informed this CSW that she picked the pt up from rehab on 5/20 and believes he should be out of his 60 day wellness period. Spouse prefers [facility] and this CSW did inquire about whether she'd be willing for pt to go to another SNF if [facility] cannot extend a bed offer. Spouse stated "if it's a 4 star facility." This CSW encouraged spouse to review Medicare.gov and informed we cannot guarantee that bed offers he will receive will reflect her wishes on the website.   Wife began to state that SNF did not help the pt and "they just let him sit in his chair or lay in bed all day." She did note that she was never present during his PT visits. Wife inquired about CIR and this CSW explained that there must be a recommendation, criteria must be met, insurance must approve, and that CIR is not the same as SNF; however, if it is recommended, we will pursue it. Wife plans to request to speak with PT and states "because I know that's what he needs." This CSW informed spouse we will follow for PT's recommendations. TOC following.  Addend @ 1:54PM Per PT, pt not a candidate for AIR. TOC following for eval note.    ED Mini Assessment: What brought you to the Emergency Department? : falls  Barriers to Discharge: SNF Pending bed offer, ED SNF auth     Means of departure:  Ambulance       Patient Contact and Communications Key Contact 1: Carney Bern (spouse) - [phone]   Spoke with: Spouse Contact Date: 04/05/23,   Contact time: 0106 Contact Phone Number: [phone]      CMS Medicare.gov Compare Post Acute Care list provided to:: Patient Represenative (must comment) (Spouse) Choice offered to / list presented to : Spouse  Admission diagnosis:  fall, pain in left thigh Patient Active Problem List   Diagnosis Date Noted   BPH (benign prostatic hyperplasia) 02/27/2023   History of anemia due to chronic kidney disease 02/27/2023   COVID-19 02/27/2023   COVID-19 virus infection 02/26/2023   Acute kidney injury superimposed on chronic kidney disease ([name]) 02/03/2023   Transient hypotension 02/03/2023   SIRS (systemic inflammatory response syndrome) ([name]) 02/03/2023   Dementia without behavioral disturbance ([name]) 02/03/2023   Fall at home, initial encounter 02/03/2023   Acute respiratory failure with hypoxia ([name]) 02/03/2023   Hypokalemia 02/03/2023   Prolonged QT interval 02/03/2023   Left ankle pain 01/08/2023   Hematuria 01/08/2023   Fracture of femoral neck, right, closed ([name]) 12/24/2022   Depression with anxiety 12/24/2022   Acute metabolic encephalopathy 09/18/2022   Acute encephalopathy 09/17/2022   AKI (acute kidney injury) ([name]) 09/04/2022   Generalized weakness 08/12/2022   Myasthenia gravis ([name]) 04/21/2022   CKD stage 3b, GFR 30-44 ml/min ([name]) 03/21/2022   Posterior  vitreous detachment of both eyes 01/23/2022   Constipation 11/13/2021   DM2 (diabetes mellitus, type 2) ([name]) 11/13/2021   Severe major depression, single episode, without psychotic features ([name]) 11/13/2021   Amaurosis fugax 11/13/2021   Amnesia 11/13/2021   Anxiety 11/13/2021   Cardiac arrhythmia 11/13/2021   Hearing loss 11/13/2021   Hypercoagulable state ([name]) 11/13/2021   Hyperparathyroidism due to renal insufficiency ([name]) 11/13/2021   Mild aortic stenosis  11/05/2021   Paroxysmal atrial fibrillation ([name]) 11/05/2021   Nasal septal deviation 08/30/2021   Anticoagulated by anticoagulation treatment 07/16/2021   Syncope 05/10/2021   Gastro-esophageal reflux disease without esophagitis 07/25/2020   Globus pharyngeus 07/25/2020   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) 12/21/2019   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   Chronic diastolic CHF (congestive heart failure) ([name]) 11/18/2019   OSA on CPAP 03/07/2019   Peripheral artery disease    Intractable vascular headache 11/11/2018   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 [name] [address] Phone: [phone] Fax: [phone]  [id number] - [place], [place] - [address] AT CORNER OF [facility] [address] [name] [address] Phone: [phone] Fax: [phone]

## 2023-04-05 NOTE — [place] FL2 (Signed)
[place] MEDICAID FL2 LEVEL OF CARE FORM     IDENTIFICATION  Patient Name: Fernando Hess Birthdate: [birth year] Sex: male Admission Date (Current Location): 04/05/2023  [facility] and [place] Number:  Producer, television/film/video and Address:  [facility],  [address] [place], [place] [address]      Provider Number: [id number]  Attending Physician Name and Address:  Rexford Maus, DO  Relative Name and Phone Number:  Jean(spouse)- [phone]    Current Level of Care: Hospital Recommended Level of Care: Skilled Nursing Facility Prior Approval Number:    Date Approved/Denied:   PASRR Number: PENDING  Discharge Plan: SNF    Current Diagnoses: Patient Active Problem List   Diagnosis Date Noted   BPH (benign prostatic hyperplasia) 02/27/2023   History of anemia due to chronic kidney disease 02/27/2023   COVID-19 02/27/2023   COVID-19 virus infection 02/26/2023   Acute kidney injury superimposed on chronic kidney disease ([name]) 02/03/2023   Transient hypotension 02/03/2023   SIRS (systemic inflammatory response syndrome) ([name]) 02/03/2023   Dementia without behavioral disturbance ([name]) 02/03/2023   Fall at home, initial encounter 02/03/2023   Acute respiratory failure with hypoxia ([name]) 02/03/2023   Hypokalemia 02/03/2023   Prolonged QT interval 02/03/2023   Left ankle pain 01/08/2023   Hematuria 01/08/2023   Fracture of femoral neck, right, closed ([name]) 12/24/2022   Depression with anxiety 12/24/2022   Acute metabolic encephalopathy 09/18/2022   Acute encephalopathy 09/17/2022   AKI (acute kidney injury) ([name]) 09/04/2022   Generalized weakness 08/12/2022   Myasthenia gravis ([name]) 04/21/2022   CKD stage 3b, GFR 30-44 ml/min ([name]) 03/21/2022   Posterior vitreous detachment of both eyes 01/23/2022   Constipation 11/13/2021   DM2 (diabetes mellitus, type 2) ([name]) 11/13/2021   Severe major depression, single episode, without psychotic features ([name]) 11/13/2021    Amaurosis fugax 11/13/2021   Amnesia 11/13/2021   Anxiety 11/13/2021   Cardiac arrhythmia 11/13/2021   Hearing loss 11/13/2021   Hypercoagulable state ([name]) 11/13/2021   Hyperparathyroidism due to renal insufficiency ([name]) 11/13/2021   Mild aortic stenosis 11/05/2021   Paroxysmal atrial fibrillation ([name]) 11/05/2021   Nasal septal deviation 08/30/2021   Anticoagulated by anticoagulation treatment 07/16/2021   Syncope 05/10/2021   Gastro-esophageal reflux disease without esophagitis 07/25/2020   Globus pharyngeus 07/25/2020   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) 12/21/2019   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   Chronic diastolic CHF (congestive heart failure) ([name]) 11/18/2019   OSA on CPAP 03/07/2019   Peripheral artery disease    Intractable vascular headache 11/11/2018   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   Coronary artery disease involving native coronary artery of native heart without angina pectoris 09/24/2015   Moderate nonproliferative diabetic retinopathy of left eye with macular edema associated with type 2 diabetes mellitus ([name]) 03/01/2008   Hyperlipidemia    Essential hypertension    History of kidney stones     Orientation RESPIRATION BLADDER Height & Weight     Self, Place  Normal Incontinent Weight:   Height:     BEHAVIORAL SYMPTOMS/MOOD NEUROLOGICAL BOWEL NUTRITION STATUS      Incontinent Diet (Regular)  AMBULATORY STATUS COMMUNICATION OF NEEDS Skin   Extensive Assist Verbally Normal                       Personal Care Assistance Level of Assistance  Bathing, Feeding, Dressing Bathing Assistance: Maximum assistance Feeding assistance: Maximum assistance Dressing Assistance:  Maximum assistance     Functional Limitations Info  Sight, Hearing, Speech Sight Info: Adequate Hearing Info: Adequate Speech Info: Adequate    SPECIAL CARE FACTORS FREQUENCY  PT (By licensed PT), OT (By licensed OT)     PT  Frequency: x5/week OT Frequency: x5/week            Contractures Contractures Info: Not present    Additional Factors Info  Code Status, Allergies Code Status Info: Full Allergies Info: Cilostazol  Dulaglutide  Levofloxacin  Liraglutide  Lisinopril           Current Medications (04/05/2023):  This is the current hospital active medication list Current Facility-Administered Medications  Medication Dose Route Frequency Provider Last Rate Last Admin   acetaminophen (TYLENOL) tablet 650 mg  650 mg Oral Q4H PRN Rexford Maus, DO       Current Outpatient Medications  Medication Sig Dispense Refill   GVOKE HYPOPEN 2-PACK 1 MG/0.2ML SOAJ Inject into the skin.     MYRBETRIQ 25 MG TB24 tablet Take 25 mg by mouth at bedtime.     acetaminophen (TYLENOL) 500 MG tablet Take 1,000 mg by mouth every 6 (six) hours as needed for moderate pain.     albuterol (VENTOLIN HFA) 108 (90 Base) MCG/ACT inhaler Inhale 2 puffs into the lungs every 4 (four) hours as needed for wheezing or shortness of breath.     apixaban (ELIQUIS) 5 MG TABS tablet Take 1 tablet (5 mg total) by mouth 2 (two) times daily. 60 tablet    ascorbic acid (VITAMIN C) 500 MG tablet Take 1 tablet (500 mg total) by mouth daily. 30 tablet 0   atorvastatin (LIPITOR) 80 MG tablet TAKE ONE TABLET BY MOUTH EVERY EVENING (Patient taking differently: Take 80 mg by mouth at bedtime.) 90 tablet 3   benzonatate (TESSALON) 200 MG capsule Take 200 mg by mouth 3 (three) times daily as needed for cough.     budesonide (PULMICORT) 0.25 MG/2ML nebulizer solution Take 2 mLs (0.25 mg total) by nebulization 2 (two) times daily. 60 mL 12   buPROPion (WELLBUTRIN XL) 300 MG 24 hr tablet Take 300 mg by mouth every morning.     cyanocobalamin (VITAMIN B12) 1000 MCG/ML injection Inject 1,000 mcg into the muscle once a week.     DULoxetine (CYMBALTA) 30 MG capsule Take 1 capsule (30 mg total) by mouth every morning. 30 capsule 0   guaiFENesin-codeine  100-10 MG/5ML syrup Take 10 mLs by mouth every 4 (four) hours as needed for cough.     hydrALAZINE (APRESOLINE) 25 MG tablet Take 1 tablet (25 mg total) by mouth every 6 (six) hours as needed (SBP>160 or DBP>110). 30 tablet 0   insulin lispro (HUMALOG) 100 UNIT/ML injection Inject 0-8 Units into the skin 3 (three) times daily. Sliding scale     ketoconazole (NIZORAL) 2 % cream Apply 1 Application topically 2 (two) times daily as needed for irritation.     LANTUS SOLOSTAR 100 UNIT/ML Solostar Pen Inject 40 Units into the skin daily.     linagliptin (TRADJENTA) 5 MG TABS tablet Take 1 tablet (5 mg total) by mouth daily. 30 tablet 0   MAGNESIUM PO Take 1 tablet by mouth daily.     Multiple Vitamins-Minerals (SENIOR MULTIVITAMIN PLUS PO) Take 1 tablet by mouth daily with breakfast.     nitroGLYCERIN (NITROSTAT) 0.4 MG SL tablet Place 0.4 mg under the tongue every 5 (five) minutes as needed for chest pain.     nystatin (  MYCOSTATIN/NYSTOP) powder Apply 1 Application topically 2 (two) times daily as needed (skin irritation). Apply to the groin in the morning and evening     ondansetron (ZOFRAN) 4 MG tablet Take 4 mg by mouth every 8 (eight) hours as needed for nausea or vomiting.     oxyCODONE-acetaminophen (PERCOCET/ROXICET) 5-325 MG tablet Take 1 tablet by mouth every 6 (six) hours as needed for severe pain.     pantoprazole (PROTONIX) 40 MG tablet TAKE 1 TABLET BY MOUTH EVERY DAY (Patient taking differently: Take 40 mg by mouth in the morning.) 90 tablet 2   polyethylene glycol (MIRALAX / GLYCOLAX) 17 g packet Take 17 g by mouth 2 (two) times daily as needed for mild constipation. 14 each 0   QUEtiapine (SEROQUEL) 50 MG tablet Take 50 mg by mouth at bedtime.     senna-docusate (SENOKOT-S) 8.6-50 MG tablet Take 1 tablet by mouth 2 (two) times daily as needed for moderate constipation.     tamsulosin (FLOMAX) 0.4 MG CAPS capsule Take 1 capsule (0.4 mg total) by mouth daily after breakfast. (Patient taking  differently: Take 0.4 mg by mouth at bedtime.) 30 capsule      Discharge Medications: Please see discharge summary for a list of discharge medications.  Relevant Imaging Results:  Relevant Lab Results:   Additional Information SSN: [id number]  Princella Ion, LCSW

## 2023-04-05 NOTE — ED Triage Notes (Signed)
Pt arrived from home via [facility]. Pt called because he fell 3 times yesterday due to myastenia gravis. He is here because he has some pain in the left leg. He takes eliquis but he has denied hitting his head when he fell.   BP 174/98 HR 108 O2 97%  CBG 117

## 2023-04-05 NOTE — ED Provider Notes (Addendum)
[place] EMERGENCY DEPARTMENT AT [facility] Provider Note   CSN: [id number] Arrival date & time: 04/05/23  1191     History  Chief Complaint  Patient presents with   Fernando Hess    Fernando Hess is a 77 y.o. male.  Patient is a 77 year old male with a past medical history of myasthenia gravis, CAD status post CABG, A-fib on Eliquis, diabetes presenting to the emergency department after a fall.  Patient states that yesterday he had a fall due to his weakness from his myasthenia gravis.  He states he does not think that he hit his head or lost consciousness but has been having pain in his left leg since.  He states that he was able to stand up and ambulate but had worsening pain with ambulation.  He denies feeling lightheaded or dizzy, having chest pain or shortness of breath prior to the fall.  Of note he is currently in the process of getting into an assisted living for his frequent falls and he states that he is working with his primary doctor to get all the things done that he needs to to qualify.  The history is provided by the patient.  Fall       Home Medications Prior to Admission medications   Medication Sig Start Date End Date Taking? Authorizing Provider  acetaminophen (TYLENOL) 500 MG tablet Take 1,000 mg by mouth every 6 (six) hours as needed for moderate pain.    [provider]  albuterol (VENTOLIN HFA) 108 (90 Base) MCG/ACT inhaler Inhale 2 puffs into the lungs every 4 (four) hours as needed for wheezing or shortness of breath. 02/25/23   [provider]  apixaban (ELIQUIS) 5 MG TABS tablet Take 1 tablet (5 mg total) by mouth 2 (two) times daily. 01/01/23   Lorin Glass, MD  ascorbic acid (VITAMIN C) 500 MG tablet Take 1 tablet (500 mg total) by mouth daily. 03/02/23   Regalado, Belkys A, MD  atorvastatin (LIPITOR) 80 MG tablet TAKE ONE TABLET BY MOUTH EVERY EVENING Patient taking differently: Take 80 mg by mouth at bedtime. 11/13/22   Tereso Newcomer T, [name]  benzonatate (TESSALON) 200 MG capsule Take 200 mg by mouth 3 (three) times daily as needed for cough. 02/23/23   [provider]  budesonide (PULMICORT) 0.25 MG/2ML nebulizer solution Take 2 mLs (0.25 mg total) by nebulization 2 (two) times daily. 03/01/23   Regalado, Belkys A, MD  buPROPion (WELLBUTRIN XL) 300 MG 24 hr tablet Take 300 mg by mouth every morning. 02/19/23   [provider]  Continuous Glucose Sensor (FREESTYLE LIBRE 3 SENSOR) MISC 1 Application by Does not apply route daily. Place 1 sensor on the skin every 14 days. Use to check glucose continuously 03/01/23   Regalado, Belkys A, MD  cyanocobalamin (VITAMIN B12) 1000 MCG/ML injection Inject 1,000 mcg into the muscle once a week.    [provider]  DULoxetine (CYMBALTA) 30 MG capsule Take 1 capsule (30 mg total) by mouth every morning. 01/13/23   Rhetta Mura, MD  guaiFENesin-codeine 100-10 MG/5ML syrup Take 10 mLs by mouth every 4 (four) hours as needed for cough. 02/25/23   [provider]  hydrALAZINE (APRESOLINE) 25 MG tablet Take 1 tablet (25 mg total) by mouth every 6 (six) hours as needed (SBP>160 or DBP>110). 01/12/23   Rhetta Mura, MD  insulin lispro (HUMALOG) 100 UNIT/ML injection Inject 0-8 Units into the skin 3 (three) times daily. Sliding scale    [provider]  ketoconazole (NIZORAL) 2 % cream Apply 1 Application topically 2 (two) times daily as needed for irritation. 02/19/23   [provider]  LANTUS SOLOSTAR 100 UNIT/ML Solostar Pen Inject 40 Units into the skin daily. 02/19/23   [provider]  linagliptin (TRADJENTA) 5 MG TABS tablet Take 1 tablet (5 mg total) by mouth daily. 03/02/23   Regalado, Belkys A, MD  MAGNESIUM PO Take 1 tablet by mouth daily.    [provider]  Multiple Vitamins-Minerals (SENIOR MULTIVITAMIN PLUS PO) Take 1 tablet by mouth daily with breakfast.    [provider]  nitroGLYCERIN  (NITROSTAT) 0.4 MG SL tablet Place 0.4 mg under the tongue every 5 (five) minutes as needed for chest pain.    [provider]  nystatin (MYCOSTATIN/NYSTOP) powder Apply 1 Application topically 2 (two) times daily as needed (skin irritation). Apply to the groin in the morning and evening    [provider]  ondansetron (ZOFRAN) 4 MG tablet Take 4 mg by mouth every 8 (eight) hours as needed for nausea or vomiting.    [provider]  oxyCODONE-acetaminophen (PERCOCET/ROXICET) 5-325 MG tablet Take 1 tablet by mouth every 6 (six) hours as needed for severe pain.    [provider]  pantoprazole (PROTONIX) 40 MG tablet TAKE 1 TABLET BY MOUTH EVERY DAY Patient taking differently: Take 40 mg by mouth in the morning. 11/25/19   [name], MD  polyethylene glycol (MIRALAX / GLYCOLAX) 17 g packet Take 17 g by mouth 2 (two) times daily as needed for mild constipation. 01/09/23   Almon Hercules, MD  QUEtiapine (SEROQUEL) 50 MG tablet Take 50 mg by mouth at bedtime. 02/23/23   [provider]  senna-docusate (SENOKOT-S) 8.6-50 MG tablet Take 1 tablet by mouth 2 (two) times daily as needed for moderate constipation. 01/09/23   Almon Hercules, MD  tamsulosin (FLOMAX) 0.4 MG CAPS capsule Take 1 capsule (0.4 mg total) by mouth daily after breakfast. Patient taking differently: Take 0.4 mg by mouth at bedtime. 01/02/23   Lorin Glass, MD      Allergies    Cilostazol, Dulaglutide, Levofloxacin, Liraglutide, and Lisinopril    Review of Systems   Review of Systems  Physical Exam Updated Vital Signs BP 135/87 (BP Location: Left Arm)   Pulse 87   Temp 98.1 F (36.7 C) (Oral)   Resp 18   SpO2 93%  Physical Exam Vitals and nursing note reviewed.  Constitutional:      Appearance: Normal appearance.  HENT:     Head: Normocephalic and atraumatic.     Nose: Nose normal.     Mouth/Throat:     Mouth: Mucous membranes are moist.     Pharynx: Oropharynx is clear.   Eyes:     Extraocular Movements: Extraocular movements intact.     Conjunctiva/sclera: Conjunctivae normal.     Pupils: Pupils are equal, round, and reactive to light.  Neck:     Comments: No midline neck tenderness Cardiovascular:     Rate and Rhythm: Normal rate and regular rhythm.     Heart sounds: Normal heart sounds.  Pulmonary:     Effort: Pulmonary effort is normal.     Breath sounds: Normal breath sounds.  Abdominal:     General: Abdomen is flat.     Palpations: Abdomen is soft.     Tenderness: There is no abdominal tenderness.  Musculoskeletal:        General: Normal range of  motion.     Cervical back: Normal range of motion and neck supple.     Comments: No midline back tenderness, pelvis stable, nontender Bony tenderness to bilateral upper extremities or right lower extremity Tenderness to palpation of the left femur, no knee or ankle tenderness, mild pain with internal/external rotation  Skin:    General: Skin is warm and dry.  Neurological:     General: No focal deficit present.     Mental Status: He is alert and oriented to person, place, and time.     Sensory: No sensory deficit.     Motor: No weakness.  Psychiatric:        Mood and Affect: Mood normal.        Behavior: Behavior normal.     ED Results / Procedures / Treatments   Labs (all labs ordered are listed, but only abnormal results are displayed) Labs Reviewed - No data to display  EKG None  Radiology DG Femur 1V Left  Result Date: 04/05/2023 CLINICAL DATA:  Fall with hip pain. EXAM: LEFT FEMUR 1 VIEW COMPARISON:  None Available. FINDINGS: Intact appearance of the mid and lower femur in the frontal projection. IMPRESSION: Intact appearance of the mid and lower femur in the frontal projection. Electronically Signed   By: Tiburcio Pea M.D.   On: 04/05/2023 09:53   DG Hip Unilat With Pelvis 2-3 Views Left  Result Date: 04/05/2023 CLINICAL DATA:  Fall last night with left hip pain. EXAM: DG HIP  (WITH OR WITHOUT PELVIS) 3V LEFT COMPARISON:  01/29/2020 CT FINDINGS: There is no evidence of hip fracture or dislocation. There is no evidence of arthropathy or other focal bone abnormality. 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 Generalized osteopenia. IMPRESSION: No acute finding. Electronically Signed   By: Tiburcio Pea M.D.   On: 04/05/2023 09:52   CT Head Wo Contrast  Result Date: 04/05/2023 CLINICAL DATA:  Recent falls. EXAM: CT HEAD WITHOUT CONTRAST CT CERVICAL SPINE WITHOUT CONTRAST TECHNIQUE: Multidetector CT imaging of the head and cervical spine was performed following the standard protocol without intravenous contrast. Multiplanar CT image reconstructions of the cervical spine were also generated. RADIATION DOSE REDUCTION: This exam was performed according to the departmental dose-optimization program which includes automated exposure control, adjustment of the mA and/or kV according to patient size and/or use of iterative reconstruction technique. COMPARISON:  Head CT 02/02/2023.  Cervical spine CT 06/17/2022 FINDINGS: CT HEAD FINDINGS Brain: There is no evidence for acute hemorrhage, hydrocephalus, mass lesion, or abnormal extra-axial fluid collection. No definite CT evidence for acute infarction. Diffuse loss of parenchymal volume is consistent with atrophy. Patchy low attenuation in the deep hemispheric and periventricular white matter is nonspecific, but likely reflects chronic microvascular ischemic demyelination. Vascular: No hyperdense vessel or unexpected calcification. Skull: No evidence for fracture. No worrisome lytic or sclerotic lesion. Sinuses/Orbits: The visualized paranasal sinuses and mastoid air cells are clear. Visualized portions of the globes and intraorbital fat are unremarkable. Other: None. CT CERVICAL SPINE FINDINGS Alignment: Stable trace anterolisthesis of C3 on 4. Skull base and vertebrae: No acute fracture. No primary bone lesion or focal pathologic process. Soft  tissues and spinal canal: No prevertebral fluid or swelling. No visible canal hematoma. Disc levels: Loss of disc height noted C4-5. Degenerative facet disease noted C2-3 and C3-4. Upper chest: Unremarkable. Other: None. IMPRESSION: 1. No acute intracranial abnormality. Atrophy with chronic small vessel white matter ischemic disease. 2. Degenerative changes in the cervical spine without fracture. Electronically Signed  By: Kennith Center M.D.   On: 04/05/2023 09:36   CT Cervical Spine Wo Contrast  Result Date: 04/05/2023 CLINICAL DATA:  Recent falls. EXAM: CT HEAD WITHOUT CONTRAST CT CERVICAL SPINE WITHOUT CONTRAST TECHNIQUE: Multidetector CT imaging of the head and cervical spine was performed following the standard protocol without intravenous contrast. Multiplanar CT image reconstructions of the cervical spine were also generated. RADIATION DOSE REDUCTION: This exam was performed according to the departmental dose-optimization program which includes automated exposure control, adjustment of the mA and/or kV according to patient size and/or use of iterative reconstruction technique. COMPARISON:  Head CT 02/02/2023.  Cervical spine CT 06/17/2022 FINDINGS: CT HEAD FINDINGS Brain: There is no evidence for acute hemorrhage, hydrocephalus, mass lesion, or abnormal extra-axial fluid collection. No definite CT evidence for acute infarction. Diffuse loss of parenchymal volume is consistent with atrophy. Patchy low attenuation in the deep hemispheric and periventricular white matter is nonspecific, but likely reflects chronic microvascular ischemic demyelination. Vascular: No hyperdense vessel or unexpected calcification. Skull: No evidence for fracture. No worrisome lytic or sclerotic lesion. Sinuses/Orbits: The visualized paranasal sinuses and mastoid air cells are clear. Visualized portions of the globes and intraorbital fat are unremarkable. Other: None. CT CERVICAL SPINE FINDINGS Alignment: Stable trace  anterolisthesis of C3 on 4. Skull base and vertebrae: No acute fracture. No primary bone lesion or focal pathologic process. Soft tissues and spinal canal: No prevertebral fluid or swelling. No visible canal hematoma. Disc levels: Loss of disc height noted C4-5. Degenerative facet disease noted C2-3 and C3-4. Upper chest: Unremarkable. Other: None. IMPRESSION: 1. No acute intracranial abnormality. Atrophy with chronic small vessel white matter ischemic disease. 2. Degenerative changes in the cervical spine without fracture. Electronically Signed   By: Kennith Center M.D.   On: 04/05/2023 09:36    Procedures Procedures    Medications Ordered in ED Medications  acetaminophen (TYLENOL) tablet 650 mg (has no administration in time range)  oxyCODONE-acetaminophen (PERCOCET/ROXICET) 5-325 MG per tablet 1 tablet (1 tablet Oral Given 04/05/23 1049)    ED Course/ Medical Decision Making/ A&P Clinical Course as of 04/05/23 1141  Sun Apr 05, 2023  1047 No acute traumatic injury on imaging.  The patient is stable for discharge home with outpatient follow-up.  He was recommended to discuss with his primary doctor about possible outpatient PT while pending assisted living for his recurrent falls.  He was given strict return precautions. [VK]  1140 Patient's wife concerned that he has had multiple falls and is unsafe to come home. Patient and wife agreeable for TOC/PT eval for possible SNF. [VK]    Clinical Course User Index [VK] Rexford Maus, DO                             Medical Decision Making This patient presents to the ED with chief complaint(s) of fall with pertinent past medical history of myasthenia gravis, CAD status post CABG, A-fib on Eliquis, diabetes which further complicates the presenting complaint. The complaint involves an extensive differential diagnosis and also carries with it a high risk of complications and morbidity.    The differential diagnosis includes due to patient's  fall on thinners concern for ICH, mass effect, cervical spine fracture, hip fracture, femur fracture, no other traumatic injuries seen on exam, no presyncopal symptoms making syncopal fall unlikely  Additional history obtained: Additional history obtained from N/A Records reviewed Primary Care Documents and recent ED records  ED Course  and Reassessment: On patient's arrival to the emergency department he is hemodynamically stable in no acute distress.  The patient will have head CT, C-spine, left hip and femur x-ray to evaluate for traumatic injury.  He will be given pain control and will be closely reassessed.  Independent labs interpretation:  N/A  Independent visualization of imaging: - I independently visualized the following imaging with scope of interpretation limited to determining acute life threatening conditions related to emergency care: CTH/C-spine, L hip/femur XR, which revealed no acute traumatic injury  Consultation: - Consulted or discussed management/test interpretation w/ external professional: N/A  Consideration for admission or further workup: Patient has no emergent conditions requiring admission or further work-up at this time and is stable for discharge home with primary care follow-up  Social Determinants of health: N/A    Amount and/or Complexity of Data Reviewed Radiology: ordered.  Risk OTC drugs. Prescription drug management.          Final Clinical Impression(s) / ED Diagnoses Final diagnoses:  Frequent falls  Left leg pain    Rx / DC Orders ED Discharge Orders     None         Rexford Maus, DO 04/05/23 1049    Elayne Snare K, DO 04/05/23 1141

## 2023-04-05 NOTE — ED Notes (Signed)
Pt brief and linen changed, male purewick placed due to incontinence and at pt request. Provided with call bell and instructed on use. Bed in lowest and locked position.

## 2023-04-05 NOTE — TOC Progression Note (Signed)
Transition of Care [facility]) - Progression Note    Patient Details  Name: Fernando Hess MRN: [id number] Date of Birth: [birth year]  [facility] CM/SW Contact  Otelia Santee, LCSW Phone Number: 04/05/2023, 3:07 PM  Clinical Narrative:    Referrals for SNF have been faxed out. Currently awaiting bed offers. Pt able to transfer to SNF pending PASRR determination, bed offer, and insurance auth.      Barriers to Discharge: SNF Pending bed offer, ED SNF auth  Expected Discharge Plan and Services                                               Social Determinants of Health (SDOH) Interventions SDOH Screenings   Food Insecurity: No Food Insecurity (02/27/2023)  Housing: Low Risk  (02/27/2023)  Transportation Needs: No Transportation Needs (02/27/2023)  Utilities: Not At Risk (02/27/2023)  Depression (PHQ2-9): Low Risk  (05/20/2019)  Social Connections: Unknown (01/15/2023)   Received from [facility]  Tobacco Use: Low Risk  (03/24/2023)    Readmission Risk Interventions     No data to display

## 2023-04-05 NOTE — Discharge Instructions (Signed)
You were seen in the emergency department after your fall.  Your workup showed no broken bones or bleeding in your brain.  You likely bruised your leg and can continue to take Tylenol every 6 hours as needed for pain as well as ice your leg and keep it elevated to help with any swelling.  You can follow-up with your primary doctor to have your symptoms rechecked and to discuss if you may benefit from outpatient physical therapy for your frequent falls.  You should return to the emergency department if you have significantly worsening pain or unable to walk, you have numbness in your leg, your toes turn blue or if you have any other new or concerning symptoms.

## 2023-04-05 NOTE — Evaluation (Signed)
Physical Therapy Evaluation Patient Details Name: Fernando Hess MRN: [id number] DOB: [birth year] Today's Date: 04/05/2023  History of Present Illness  77 year old male  presenting to the emergency department after a fall. Patient states that yesterday he had a fall due to his weakness from his myasthenia gravis. imaging of head/spine and femur negative. 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  Clinical Impression  Pt admitted with above diagnosis.  Pt currently with functional limitations due to the deficits listed below (see PT Problem List). Pt will benefit from acute skilled PT to increase their independence and safety with mobility to allow discharge.  Patient will benefit from continued follow up therapy, <3 hours/day  Pt reports independence with transfers at home, endorses multiple falls recently and notes incr in tremors/weakness all extremities.  Pt required +2 assist for supine<>sit with notable tremors all 4 extremities as well as extensor tone LEs; unsafe to attempt standing with 2 assist. Returned pt to supine and changed bed d/t urine "spill" Pt appreciative  Continue to follow in acute setting            Assistance Recommended at Discharge Frequent or constant Supervision/Assistance  If plan is discharge home, recommend the following:  Can travel by private vehicle  Two people to help with walking and/or transfers;Two people to help with bathing/dressing/bathroom;Help with stairs or ramp for entrance;Assist for transportation;Assistance with cooking/housework   No    Equipment Recommendations None recommended by PT  Recommendations for Other Services       Functional Status Assessment Patient has had a recent decline in their functional status and demonstrates the ability to make significant improvements in function in a reasonable and predictable amount of time.      Precautions / Restrictions Precautions Precautions: Fall Precaution Comments: tremors all extremities and then noted rigidity LEs-fall risk Restrictions Weight Bearing Restrictions: No      Mobility  Bed Mobility Overal bed mobility: Needs Assistance Bed Mobility: Supine to Sit, Sit to Supine, Rolling Rolling: Min assist, Mod assist   Supine to sit: Max assist, Mod assist, +2 for physical assistance, +2 for safety/equipment Sit to supine: Mod assist, Max assist, +2 for physical assistance, +2 for safety/equipment   General bed mobility comments: assist of 2 to progress LEs off bed and elevate trunk; pt with UE/LE tremors noted with attempt to sit and extensor tone LEs; pt trunk lowered and LEs elevated with 2 assist; rolling for bed change d/t urines "spill"    Transfers                        Ambulation/Gait                  Stairs            Wheelchair Mobility     Tilt Bed    Modified Rankin (Stroke Patients Only)       Balance Overall balance assessment: History of Falls Sitting-balance support: No upper extremity supported, Feet unsupported Sitting balance-Leahy Scale: Zero Sitting balance - Comments: 2 assist, unable to maintain sitting d/t tremors UEs/LEs                                     Pertinent Vitals/Pain Pain Assessment Pain Assessment: Faces Faces Pain Scale: Hurts little more Pain Location:  L thigh Pain Descriptors / Indicators: Grimacing, Sore Pain Intervention(s): Limited activity within patient's tolerance, Monitored during session, Premedicated before session, Repositioned    Home Living Family/patient expects to be discharged to:: Private residence Living Arrangements: Spouse/significant other Available Help at Discharge: Family;Personal care attendant Type of Home: House Home Access: Ramped entrance;Stairs to enter Entrance Stairs-Rails: Left Entrance Stairs-Number of Steps: 5   Home  Layout: Two level;Able to live on main level with bedroom/bathroom Home Equipment: Rollator (4 wheels);Shower seat;Grab bars - tub/shower;BSC/3in1;Wheelchair - manual Additional Comments: Has aide that comes 3 days/week for 3 hours to assist pt and his wife with ADL/IADLs    Prior Function Prior Level of Function : Needs assist       Physical Assist : Mobility (physical);ADLs (physical)     Mobility Comments: reported only transfers with RW to [facility] and mobilizes in [facility] at home, endorses multipel falls       Hand Dominance        Extremity/Trunk Assessment   Upper Extremity Assessment Upper Extremity Assessment: Generalized weakness;RUE deficits/detail;LUE deficits/detail RUE Deficits / Details: tremulous bil UEs,  strength grossly 2+/5 RUE Coordination: decreased gross motor;decreased fine motor LUE Coordination: decreased gross motor;decreased fine motor    Lower Extremity Assessment Lower Extremity Assessment: RLE deficits/detail;LLE deficits/detail RLE Deficits / Details: tremors  noted, AAROM limtied by pain L thigh; strength 2+/5 LLE Deficits / Details: tremors noted, AROM grossly WFL, strength 3/5       Communication   Communication: No difficulties  Cognition Arousal/Alertness: Awake/alert Behavior During Therapy: WFL for tasks assessed/performed Overall Cognitive Status: Within Functional Limits for tasks assessed                                 General Comments: alert oriented at time of PT eval, fuzzy on timeline of events--multiple admissions in last few months ; follows commands consistently        General Comments      Exercises     Assessment/Plan    PT Assessment Patient needs continued PT services  PT Problem List Decreased strength;Decreased activity tolerance;Decreased balance;Decreased mobility;Decreased knowledge of precautions;Decreased coordination       PT Treatment Interventions DME instruction;Functional mobility  training;Therapeutic activities;Patient/family education;Therapeutic exercise;Gait training    PT Goals (Current goals can be found in the Care Plan section)  Acute Rehab PT Goals Patient Stated Goal: get to ALF PT Goal Formulation: With patient Time For Goal Achievement: 04/12/23 Potential to Achieve Goals: Fair    Frequency Min 1X/week     Co-evaluation               AM-PAC PT "6 Clicks" Mobility  Outcome Measure Help needed turning from your back to your side while in a flat bed without using bedrails?: Total Help needed moving from lying on your back to sitting on the side of a flat bed without using bedrails?: Total Help needed moving to and from a bed to a chair (including a wheelchair)?: Total Help needed standing up from a chair using your arms (e.g., wheelchair or bedside chair)?: Total Help needed to walk in hospital room?: Total Help needed climbing 3-5 steps with a railing? : Total 6 Click Score: 6    End of Session Equipment Utilized During Treatment: Gait belt Activity Tolerance: Patient limited by fatigue Patient left: in bed;with call bell/phone within reach   PT Visit Diagnosis: Other abnormalities of gait and mobility (R26.89);Difficulty in  walking, not elsewhere classified (R26.2);Muscle weakness (generalized) (M62.81)    Time: 1405-1430 PT Time Calculation (min) (ACUTE ONLY): 25 min   Charges:   PT Evaluation $PT Eval Low Complexity: 1 Low PT Treatments $Therapeutic Activity: 8-22 mins PT General Charges $$ ACUTE PT VISIT: 1 Visit         Harvey Lingo, PT  Acute Rehab Dept [facility]) [phone]  04/05/2023   [facility] 04/05/2023, 2:50 PM

## 2023-04-06 LAB — URINALYSIS, ROUTINE W REFLEX MICROSCOPIC
Bacteria, UA: NONE SEEN
Bilirubin Urine: NEGATIVE
Glucose, UA: 150 mg/dL — AB
Hgb urine dipstick: NEGATIVE
Ketones, ur: 5 mg/dL — AB
Leukocytes,Ua: NEGATIVE
Nitrite: NEGATIVE
Protein, ur: 100 mg/dL — AB
Specific Gravity, Urine: 1.024 (ref 1.005–1.030)
pH: 6 (ref 5.0–8.0)

## 2023-04-06 LAB — CBC WITH DIFFERENTIAL/PLATELET
Abs Immature Granulocytes: 0.12 10*3/uL — ABNORMAL HIGH (ref 0.00–0.07)
Basophils Absolute: 0.1 10*3/uL (ref 0.0–0.1)
Basophils Relative: 1 %
Eosinophils Absolute: 0 10*3/uL (ref 0.0–0.5)
Eosinophils Relative: 0 %
HCT: 41.1 % (ref 39.0–52.0)
Hemoglobin: 12.9 g/dL — ABNORMAL LOW (ref 13.0–17.0)
Immature Granulocytes: 1 %
Lymphocytes Relative: 6 %
Lymphs Abs: 0.9 10*3/uL (ref 0.7–4.0)
MCH: 29.6 pg (ref 26.0–34.0)
MCHC: 31.4 g/dL (ref 30.0–36.0)
MCV: 94.3 fL (ref 80.0–100.0)
Monocytes Absolute: 1.8 10*3/uL — ABNORMAL HIGH (ref 0.1–1.0)
Monocytes Relative: 12 %
Neutro Abs: 12.1 10*3/uL — ABNORMAL HIGH (ref 1.7–7.7)
Neutrophils Relative %: 80 %
Platelets: 197 10*3/uL (ref 150–400)
RBC: 4.36 MIL/uL (ref 4.22–5.81)
RDW: 15.7 % — ABNORMAL HIGH (ref 11.5–15.5)
WBC: 15 10*3/uL — ABNORMAL HIGH (ref 4.0–10.5)
nRBC: 0 % (ref 0.0–0.2)

## 2023-04-06 LAB — COMPREHENSIVE METABOLIC PANEL
ALT: 23 U/L (ref 0–44)
AST: 19 U/L (ref 15–41)
Albumin: 3.3 g/dL — ABNORMAL LOW (ref 3.5–5.0)
Alkaline Phosphatase: 90 U/L (ref 38–126)
Anion gap: 9 (ref 5–15)
BUN: 27 mg/dL — ABNORMAL HIGH (ref 8–23)
CO2: 24 mmol/L (ref 22–32)
Calcium: 8.5 mg/dL — ABNORMAL LOW (ref 8.9–10.3)
Chloride: 107 mmol/L (ref 98–111)
Creatinine, Ser: 1.79 mg/dL — ABNORMAL HIGH (ref 0.61–1.24)
GFR, Estimated: 38 mL/min — ABNORMAL LOW (ref >60)
Glucose, Bld: 193 mg/dL — ABNORMAL HIGH (ref 70–99)
Potassium: 3.9 mmol/L (ref 3.5–5.1)
Sodium: 140 mmol/L (ref 135–145)
Total Bilirubin: 1.6 mg/dL — ABNORMAL HIGH (ref 0.3–1.2)
Total Protein: 6.6 g/dL (ref 6.5–8.1)

## 2023-04-06 LAB — CBG MONITORING, ED
Glucose-Capillary: 261 mg/dL — ABNORMAL HIGH (ref 70–99)
Glucose-Capillary: 194 mg/dL — ABNORMAL HIGH (ref 70–99)
Glucose-Capillary: 195 mg/dL — ABNORMAL HIGH (ref 70–99)
Glucose-Capillary: 197 mg/dL — ABNORMAL HIGH (ref 70–99)

## 2023-04-06 MED ORDER — QUETIAPINE FUMARATE 50 MG PO TABS
50.0000 mg | ORAL_TABLET | Freq: Every day | ORAL | Status: DC
  Administered 2023-04-06 – 2023-04-07 (×2): 50 mg via ORAL
  Filled 2023-04-06 (×2): qty 1

## 2023-04-06 NOTE — ED Notes (Signed)
Pt readjusted in bed and give diet coke. Lights dimmed

## 2023-04-06 NOTE — ED Notes (Signed)
Spoke with wife again, informed on plan for patient, verbalized satisfaction in plan of care. Family states he sleeps better with a TV on and the one in his room is broken, patient moved to room 12 to satisfy request. Given more crackers, peanut butter, and a drink per request. Patients mood is now calm and cooperative. Call light in reach.

## 2023-04-06 NOTE — Progress Notes (Signed)
Physical Therapy Treatment Patient Details Name: Fernando Hess MRN: [id number] DOB: 29-Nov-1945 Today's Date: 04/06/2023   History of Present Illness 77 year old male  presenting to the emergency department after a fall. Patient states that yesterday he had a fall due to his weakness from his myasthenia gravis. imaging of head/spine and femur negative. 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    PT Comments   Pt admitted with above diagnosis.  Pt currently with functional limitations due to the deficits listed below (see PT Problem List). Pt presented to therapy on stretcher with L lateral lean, pt unable to self correct positioning in bed with use of bed rails, required max A. Pt ed provided on PT intervention and goal to sit EOB and to determine need for progression of therapy services. PT continues to recommend need for inpatient follow up therapy, <3 hours/day. Pt is limited with active participation secondary to B UE and LE involuntary movements/tremors attributed to hx of MG. Pt is unable to present a time line for increased tremor activity. Pt exhibits signs and symptoms of fear of falling and requires increased time for all functional mobility tasks with extensive assist. Pt was however able to sit EOB with mod A and multimodal cues today. Pt left in bed all needs in place.  Unsafe at this time for trials with standing, transfers or gait.  Pt will benefit from acute skilled PT to increase their independence and safety with mobility to allow discharge to the next venue.       Assistance Recommended at Discharge Frequent or constant Supervision/Assistance  If plan is discharge home, recommend the following:  Can travel by private vehicle    Two people to help with walking and/or transfers;Two people to help with bathing/dressing/bathroom;Help with stairs or ramp for entrance;Assist for  transportation;Assistance with cooking/housework   No  Equipment Recommendations  None recommended by PT    Recommendations for Other Services       Precautions / Restrictions Precautions Precautions: Fall Precaution Comments: tremors all extremities and then noted rigidity LEs-fall risk Restrictions Weight Bearing Restrictions: No     Mobility  Bed Mobility Overal bed mobility: Needs Assistance Bed Mobility: Supine to Sit, Sit to Supine, Rolling Rolling: Max assist   Supine to sit: Max assist, +2 for physical assistance, +2 for safety/equipment Sit to supine: Max assist, +2 for physical assistance, +2 for safety/equipment   General bed mobility comments: NT assisted PT with intervention, due to tremors pt exhibited poor motor control and coordination, pt required multimodal cues for all functional mobility tasks with increased time, pt exhibited difficulty releasing B UE grasp with rolling tasks today. with passive movement pt B UE and LE tremor activity increased    Transfers                   General transfer comment: NT for pt and staff safety    Ambulation/Gait               General Gait Details: NT   Stairs             Wheelchair Mobility     Tilt Bed    Modified Rankin (Stroke Patients Only)       Balance Overall balance assessment: History of Falls Sitting-balance support: Single extremity supported, Feet supported Sitting balance-Leahy Scale: Zero Sitting balance - Comments: pt required mod to max A for sitting  balance on edge of stretcher, pt ehibited a strong L lateral lean when in bed and when sitting, pt able to use R UE for support on bed with facilitation for placement and input with PT provding WB assist to limit tremors. PT able to have NT place step stool under B LEs and able to provide sensory input and pressure to B LEs and able to accomplish static sitting without tremor activity for ~ 2 mins. Pt unable to maintain  attention to midline or implement self recovery stratagies to accomodate for L lateal lean. pt indicated fatigue seated EOB.                                    Cognition Arousal/Alertness: Awake/alert Behavior During Therapy: Flat affect Overall Cognitive Status: Impaired/Different from baseline Area of Impairment: Attention, Following commands                               General Comments: pt demonstrated difficulty consisitantly following one step commands increased time for motor processing and planning pt limited verbal communication during tx session        Exercises      General Comments        Pertinent Vitals/Pain Pain Assessment Pain Assessment: Faces Faces Pain Scale: Hurts little more Pain Location: L thigh Pain Descriptors / Indicators: Grimacing, Sore Pain Intervention(s): Limited activity within patient's tolerance, Monitored during session    Home Living                          Prior Function            PT Goals (current goals can now be found in the care plan section) Acute Rehab PT Goals Patient Stated Goal: get to ALF PT Goal Formulation: With patient Time For Goal Achievement: 04/12/23 Potential to Achieve Goals: Fair Progress towards PT goals: Progressing toward goals    Frequency    Min 1X/week      PT Plan Current plan remains appropriate    Co-evaluation              AM-PAC PT "6 Clicks" Mobility   Outcome Measure  Help needed turning from your back to your side while in a flat bed without using bedrails?: Total Help needed moving from lying on your back to sitting on the side of a flat bed without using bedrails?: Total Help needed moving to and from a bed to a chair (including a wheelchair)?: Total Help needed standing up from a chair using your arms (e.g., wheelchair or bedside chair)?: Total Help needed to walk in hospital room?: Total Help needed climbing 3-5 steps with a railing?  : Total 6 Click Score: 6    End of Session   Activity Tolerance: Patient limited by fatigue Patient left: in bed;with call bell/phone within reach Nurse Communication: Other (comment);Mobility status (pill found in bed) PT Visit Diagnosis: Other abnormalities of gait and mobility (R26.89);Difficulty in walking, not elsewhere classified (R26.2);Muscle weakness (generalized) (M62.81);Ataxic gait (R26.0)     Time: 4098-1191 PT Time Calculation (min) (ACUTE ONLY): 26 min  Charges:    $Therapeutic Activity: 8-22 mins $Neuromuscular Re-education: 8-22 mins PT General Charges $$ ACUTE PT VISIT: 1 Visit  Johnny Bridge, PT Acute Rehab  Jacqualyn Posey 04/06/2023, 7:03 PM

## 2023-04-06 NOTE — ED Provider Notes (Signed)
Emergency Medicine Observation Re-evaluation Note  Fernando Hess is a 77 y.o. male, seen on rounds today.  Pt initially presented to the ED for complaints of Fall Currently, the patient is sleeping.  Physical Exam  BP (!) 213/100   Pulse 100   Temp 98.1 F (36.7 C) (Oral)   Resp 18   SpO2 94%  Physical Exam General: Sleeping Cardiac: Extremities perfused Lungs: Unlabored breathing Psych: Deferred  ED Course / MDM  EKG:   I have reviewed the labs performed to date as well as medications administered while in observation.  Recent changes in the last 24 hours include PT eval.  Patient required 2 person assist with walking and transfers.  Social work is working on placement.  Plan  Current plan is for placement.    Gloris Manchester, MD 04/06/23 [phone]

## 2023-04-06 NOTE — Progress Notes (Addendum)
This CSW faxed over signed 30 day note to [employer]. Following for PASRR issuance.  Addend @ 5:51 PM Received correspondence from [facility] requesting: Please upload most recent PT/OT note within past 48 hrs indicating out of bed transfers and MD progress notes within the past 24 hrs indicating medical stability.   This CSW outreached to [facility] PT requesting the patient be seen tomorrow. PT supervisor reports pt may be seen tonight by evening PT team; if not, will be seen tomorrow. TOC following.

## 2023-04-06 NOTE — Progress Notes (Addendum)
2:20pm: CSW returned call to patient's wife Fernando Hess who states she and the patient want to accept the bed offer from [facility].   CSW will initiate insurance authorization for [employer].   12:30pm: CSW spoke with patient's wife Fernando Hess to present her with bed offers. Fernando Hess adamant that patient has to go to 3 star or greater facility. None of the current bed offers meet Fernando Hess's expectations. CSW offered to obtain home health for patient and discharge him home however she states she is already paying privately for one caregiver but cannot afford to pay for two caregivers 24/7. CSW informed Fernando Hess a decision must be made regarding facility choice ASAP so Fernando Hess requested CSW return call to in one hour to determine what decision has been made.  8:30am: CSW completed [place] MUST screening for PASRR and documents were uploaded to the portal.  CSW reached out to [place] at [facility] to request she review patient's clinicals to determine if a bed offer can be made.  Edwin Dada, MSW, LCSW Transitions of Care  Clinical Social Worker II [phone]

## 2023-04-06 NOTE — ED Notes (Addendum)
Wife called stating pt needed assistance. When in to check on pt, pt confused asking when he's going upstairs and mentioning a Mds name. Reoriented pt and updated on plan of care for PT/OT eval this am. Pt is in stable condition awaiting assessment. Needs met at this time. Call bell in reach, bed in lowest and locked position

## 2023-04-06 NOTE — Progress Notes (Signed)
This CSW uploaded recent PT eval to [facility] along with MD progress note from EDP [place] on 7/22. TOC following for determination from insurance.

## 2023-04-06 NOTE — Progress Notes (Signed)
30 Day PASRR Note   Patient Details  Name: Fernando Hess Date of Birth: [birth year]   Transition of Care [facility]) CM/SW Contact:    Princella Ion, LCSW Phone Number: 04/06/2023, 5:39 PM  To Whom It May Concern:  Please be advised that this patient will require a short-term nursing home stay - anticipated 30 days or less for rehabilitation and strengthening.   The plan is for return home.

## 2023-04-06 NOTE — ED Notes (Signed)
Pts wife called and states since pts left leg is still hurting pt may need an MRI due to falling on knees when brought home by EMS months ago.

## 2023-04-06 NOTE — ED Notes (Signed)
Spoke with patients wife who is concerned patient has called her and told her he is "sitting outside with the trash" In room with patient and wife could hear he is still in 24 as before and reassured he is safe and in bed. Patient agitated continues to state he is going to call 911 to go home, educated again that we are spending the night here at the hospital and waiting for PT/OT eval in the morning, that they are not here overnight.  Wife states he does this every time he is in the hospital, requesting something to help him sleep. Advised family that this could cause a delay in PT if he is drowsy from medicating him this early in the morning, she states she is also worried he will not be safe if he has not slept either. MD made aware of request.

## 2023-04-06 NOTE — Progress Notes (Signed)
Name: Fernando Hess DOB: [birth year]  Please be advised that the above-named patient will require a short-term nursing home stay -- anticipated 30 days or less for rehabilitation and strengthening. The plan is for return home after discharge from SNF.  Edwin Dada, MSW, LCSW Transitions of Care  Clinical Social Worker II [phone]

## 2023-04-07 ENCOUNTER — Encounter (HOSPITAL_COMMUNITY): Payer: Self-pay

## 2023-04-07 LAB — CBG MONITORING, ED
Glucose-Capillary: 303 mg/dL — ABNORMAL HIGH (ref 70–99)
Glucose-Capillary: 331 mg/dL — ABNORMAL HIGH (ref 70–99)
Glucose-Capillary: 209 mg/dL — ABNORMAL HIGH (ref 70–99)
Glucose-Capillary: 239 mg/dL — ABNORMAL HIGH (ref 70–99)

## 2023-04-07 MED ORDER — INSULIN GLARGINE-YFGN 100 UNIT/ML ~~LOC~~ SOLN
40.0000 [IU] | Freq: Every day | SUBCUTANEOUS | Status: DC
  Administered 2023-04-07 – 2023-04-08 (×2): 40 [IU] via SUBCUTANEOUS
  Filled 2023-04-07 (×2): qty 0.4

## 2023-04-07 NOTE — ED Notes (Signed)
Nurse gave pt his 2200 dose of insulin. Pt able to identify self and location month and year. He stated when ever he gets shaky/tremors he tends to lose focus and has difficulty with speech.

## 2023-04-07 NOTE — ED Provider Notes (Signed)
Emergency Medicine Observation Re-evaluation Note  Fernando Hess is a 77 y.o. male,   Pt initially presented to the ED for complaints of Fall Family not able to care for pt at home.  Requesting snf  Physical Exam  BP (!) 146/77   Pulse (!) 58   Temp 98.1 F (36.7 C)   Resp 16   Ht 1.676 m (5\' 6" )   Wt 75 kg   SpO2 93%   BMI 26.69 kg/m  Physical Exam   ED Course / MDM  EKG:   I have reviewed the labs performed to date as well as medications administered while in observation.  Recent changes in the last 24 hours include no acute events however blood sugar trending upward.  Plan  Pt has not been on his long acting insulin in the ED.  Long acting insulin ordered.  Current plan is for possible SNF    Linwood Dibbles, MD 04/07/23 1408

## 2023-04-07 NOTE — Progress Notes (Addendum)
2:10pm: Patient's insurance authorization remains pending at this time.  11:30am: Patient's PASRR is [id number].  10:45am: CSW received call from [facility] requesting clarity be given on patient's current physical state. CSW answered questions - no additional clinicals required at this time. Navi MD to review patient and will make a determination.  10:10am: CSW uploaded document to  [place] [employer] to support PASRR application.  8:30am: Patient's insurance authorization is still pending at this time.  Edwin Dada, MSW, LCSW Transitions of Care  Clinical Social Worker II [phone]

## 2023-04-07 NOTE — ED Notes (Signed)
Pt wife called and stated her husband seems more confused and shaky than usual. She wanted to know what medication were due advised her his 2200 meds. Pt stated she would like a physician to reach out to her in the morning if possible regarding changing pt meds.

## 2023-04-07 NOTE — ED Notes (Signed)
Call received from pt wife Duey Liller [phone] requesting rtn call for pt status/updates. [employer]

## 2023-04-08 LAB — CBG MONITORING, ED: Glucose-Capillary: 174 mg/dL — ABNORMAL HIGH (ref 70–99)

## 2023-04-08 NOTE — ED Notes (Signed)
Patient cleaned up and bedding changed. Will call report and PTAR to take patient to [facility]. Pt took all morning medications without difficulty.

## 2023-04-08 NOTE — ED Notes (Signed)
Nurse found pt resting in bed. Asked pt if he needs anything pt stated no.

## 2023-04-08 NOTE — ED Notes (Signed)
Carney Bern, wife [phone]

## 2023-04-08 NOTE — ED Notes (Signed)
Report called to [place] at [facility]. PTAR also called and transportation has been set up.

## 2023-04-08 NOTE — Progress Notes (Addendum)
Patient's insurance authorization has been approved. Patient can discharge to [facility] today.  Patient will be transported via PTAR to [facility], room 207P. RN to call report to [phone] and PTAR when ready.  CSW spoke with patient's wife Carney Bern to inform her of discharge plan.  Edwin Dada, MSW, LCSW Transitions of Care  Clinical Social Worker II [phone]

## 2023-04-08 NOTE — ED Provider Notes (Signed)
Emergency Medicine Observation Re-evaluation Note  Fernando Hess is a 77 y.o. male, seen on rounds today.  Pt initially presented to the ED for complaints of Fall Currently, the patient is resting, no acute distress.  Physical Exam  BP 122/78   Pulse (!) 59   Temp 97.9 F (36.6 C) (Oral)   Resp 16   Ht 1.676 m (5\' 6" )   Wt 75 kg   SpO2 95%   BMI 26.69 kg/m  Physical Exam   ED Course / MDM  EKG:   I have reviewed the labs performed to date as well as medications administered while in observation.  Recent changes in the last 24 hours include none.  Plan  Current plan is for to nursing facility today.    Lorre Nick, MD 04/08/23 1019

## 2023-04-14 ENCOUNTER — Emergency Department (HOSPITAL_COMMUNITY)
Admission: EM | Admit: 2023-04-14 | Discharge: 2023-04-15 | Disposition: A | Discharge status: Home / Self Care | Payer: Medicare Other | Attending: Emergency Medicine | Admitting: Emergency Medicine

## 2023-04-14 ENCOUNTER — Other Ambulatory Visit: Payer: Self-pay

## 2023-04-14 DIAGNOSIS — S8002XA Contusion of left knee, initial encounter: Secondary | ICD-10-CM | POA: Insufficient documentation

## 2023-04-14 DIAGNOSIS — F0390 Unspecified dementia without behavioral disturbance: Secondary | ICD-10-CM | POA: Diagnosis not present

## 2023-04-14 DIAGNOSIS — Z7984 Long term (current) use of oral hypoglycemic drugs: Secondary | ICD-10-CM | POA: Insufficient documentation

## 2023-04-14 DIAGNOSIS — I2510 Atherosclerotic heart disease of native coronary artery without angina pectoris: Secondary | ICD-10-CM | POA: Diagnosis not present

## 2023-04-14 DIAGNOSIS — S8992XA Unspecified injury of left lower leg, initial encounter: Secondary | ICD-10-CM | POA: Diagnosis present

## 2023-04-14 DIAGNOSIS — Z951 Presence of aortocoronary bypass graft: Secondary | ICD-10-CM | POA: Insufficient documentation

## 2023-04-14 DIAGNOSIS — I509 Heart failure, unspecified: Secondary | ICD-10-CM | POA: Diagnosis not present

## 2023-04-14 DIAGNOSIS — Z7901 Long term (current) use of anticoagulants: Secondary | ICD-10-CM | POA: Insufficient documentation

## 2023-04-14 DIAGNOSIS — D72829 Elevated white blood cell count, unspecified: Secondary | ICD-10-CM | POA: Diagnosis not present

## 2023-04-14 DIAGNOSIS — R58 Hemorrhage, not elsewhere classified: Secondary | ICD-10-CM

## 2023-04-14 DIAGNOSIS — W19XXXA Unspecified fall, initial encounter: Secondary | ICD-10-CM | POA: Diagnosis not present

## 2023-04-14 DIAGNOSIS — E119 Type 2 diabetes mellitus without complications: Secondary | ICD-10-CM | POA: Insufficient documentation

## 2023-04-14 DIAGNOSIS — Z794 Long term (current) use of insulin: Secondary | ICD-10-CM | POA: Insufficient documentation

## 2023-04-14 NOTE — ED Triage Notes (Signed)
Pt BIB EMS from [facility] c/o pain above left knee with significant swelling.

## 2023-04-15 ENCOUNTER — Emergency Department (HOSPITAL_COMMUNITY): Payer: Medicare Other

## 2023-04-15 ENCOUNTER — Other Ambulatory Visit (HOSPITAL_COMMUNITY): Payer: Self-pay

## 2023-04-15 LAB — CBC WITH DIFFERENTIAL/PLATELET
Abs Immature Granulocytes: 0.33 10*3/uL — ABNORMAL HIGH (ref 0.00–0.07)
Basophils Absolute: 0.1 10*3/uL (ref 0.0–0.1)
Basophils Relative: 1 %
Eosinophils Absolute: 0.3 10*3/uL (ref 0.0–0.5)
Eosinophils Relative: 2 %
HCT: 34.4 % — ABNORMAL LOW (ref 39.0–52.0)
Hemoglobin: 10.6 g/dL — ABNORMAL LOW (ref 13.0–17.0)
Immature Granulocytes: 3 %
Lymphocytes Relative: 12 %
Lymphs Abs: 1.3 10*3/uL (ref 0.7–4.0)
MCH: 29.9 pg (ref 26.0–34.0)
MCHC: 30.8 g/dL (ref 30.0–36.0)
MCV: 97.2 fL (ref 80.0–100.0)
Monocytes Absolute: 1.2 10*3/uL — ABNORMAL HIGH (ref 0.1–1.0)
Monocytes Relative: 11 %
Neutro Abs: 7.7 10*3/uL (ref 1.7–7.7)
Neutrophils Relative %: 71 %
Platelets: 341 10*3/uL (ref 150–400)
RBC: 3.54 MIL/uL — ABNORMAL LOW (ref 4.22–5.81)
RDW: 15.6 % — ABNORMAL HIGH (ref 11.5–15.5)
WBC: 10.9 10*3/uL — ABNORMAL HIGH (ref 4.0–10.5)
nRBC: 0 % (ref 0.0–0.2)

## 2023-04-15 LAB — PROTIME-INR
INR: 1.5 — ABNORMAL HIGH (ref 0.8–1.2)
Prothrombin Time: 18.1 seconds — ABNORMAL HIGH (ref 11.4–15.2)

## 2023-04-15 LAB — COMPREHENSIVE METABOLIC PANEL
ALT: 23 U/L (ref 0–44)
AST: 23 U/L (ref 15–41)
Albumin: 2.8 g/dL — ABNORMAL LOW (ref 3.5–5.0)
Alkaline Phosphatase: 123 U/L (ref 38–126)
Anion gap: 8 (ref 5–15)
BUN: 37 mg/dL — ABNORMAL HIGH (ref 8–23)
CO2: 24 mmol/L (ref 22–32)
Calcium: 8.4 mg/dL — ABNORMAL LOW (ref 8.9–10.3)
Chloride: 106 mmol/L (ref 98–111)
Creatinine, Ser: 1.82 mg/dL — ABNORMAL HIGH (ref 0.61–1.24)
GFR, Estimated: 37 mL/min — ABNORMAL LOW (ref >60)
Glucose, Bld: 347 mg/dL — ABNORMAL HIGH (ref 70–99)
Potassium: 4 mmol/L (ref 3.5–5.1)
Sodium: 138 mmol/L (ref 135–145)
Total Bilirubin: 1.4 mg/dL — ABNORMAL HIGH (ref 0.3–1.2)
Total Protein: 6 g/dL — ABNORMAL LOW (ref 6.5–8.1)

## 2023-04-15 MED ORDER — ACETAMINOPHEN 325 MG PO TABS
650.0000 mg | ORAL_TABLET | Freq: Once | ORAL | Status: AC
  Administered 2023-04-15: 650 mg via ORAL
  Filled 2023-04-15: qty 2

## 2023-04-15 MED ORDER — VALSARTAN 40 MG PO TABS
40.0000 mg | ORAL_TABLET | Freq: Every day | ORAL | 0 refills | Status: DC
  Filled 2023-04-15: qty 30, 30d supply, fill #0

## 2023-04-15 NOTE — ED Provider Notes (Signed)
[place] EMERGENCY DEPARTMENT AT [facility] Provider Note   CSN: [id number] Arrival date & time: 04/14/23  1659     History  Chief Complaint  Patient presents with   Knee Pain    Fernando Hess is a 77 y.o. male who presents to the emergency department for evaluation of extensive bruising to the left leg.  Patient with history of dementia, presenting from [facility] rehabilitation facility.  Patient with multiple visits this year for frequent falls.  Patient is anticoagulated on Eliquis.  Patient denies known falls but does appear somewhat confused with history of dementia.  Also endorses recent surgery to the right hip , Underwent arthroplasty in April of this year due to closed fracture of the right hip with femoral neck.  In addition to the above listed history is history of type 2 diabetes requiring insulin, CABG for CAD, myasthenia gravis, CHF.  HPI     Home Medications Prior to Admission medications   Medication Sig Start Date End Date Taking? Authorizing Provider  acetaminophen (TYLENOL) 500 MG tablet Take 1,000 mg by mouth every 6 (six) hours as needed for moderate pain.    [provider]  apixaban (ELIQUIS) 5 MG TABS tablet Take 1 tablet (5 mg total) by mouth 2 (two) times daily. 01/01/23   Lorin Glass, MD  ascorbic acid (VITAMIN C) 500 MG tablet Take 1 tablet (500 mg total) by mouth daily. 03/02/23   Regalado, Belkys A, MD  atorvastatin (LIPITOR) 80 MG tablet TAKE ONE TABLET BY MOUTH EVERY EVENING Patient taking differently: Take 80 mg by mouth daily. 11/13/22   Tereso Newcomer T, [name]  buPROPion (WELLBUTRIN XL) 300 MG 24 hr tablet Take 300 mg by mouth every morning. 02/19/23   [provider]  cyanocobalamin (VITAMIN B12) 1000 MCG/ML injection Inject 1,000 mcg into the muscle once a week.    [provider]  DULoxetine (CYMBALTA) 30 MG capsule Take 1 capsule (30 mg total) by mouth every morning. 01/13/23   Rhetta Mura, MD   guaiFENesin-codeine 100-10 MG/5ML syrup Take 10 mLs by mouth every 4 (four) hours as needed for cough. 02/25/23   [provider]  GVOKE HYPOPEN 2-PACK 1 MG/0.2ML SOAJ Inject into the skin. 04/03/23   [provider]  hydrALAZINE (APRESOLINE) 25 MG tablet Take 1 tablet (25 mg total) by mouth every 6 (six) hours as needed (SBP>160 or DBP>110). 01/12/23   Rhetta Mura, MD  ketoconazole (NIZORAL) 2 % cream Apply 1 Application topically 2 (two) times daily as needed for irritation. 02/19/23   [provider]  LANTUS SOLOSTAR 100 UNIT/ML Solostar Pen Inject 40 Units into the skin daily. 02/19/23   [provider]  linagliptin (TRADJENTA) 5 MG TABS tablet Take 1 tablet (5 mg total) by mouth daily. 03/02/23   Regalado, Belkys A, MD  MAGNESIUM PO Take 1 tablet by mouth daily.    [provider]  Multiple Vitamins-Minerals (SENIOR MULTIVITAMIN PLUS PO) Take 1 tablet by mouth daily with breakfast.    [provider]  MYRBETRIQ 25 MG TB24 tablet Take 25 mg by mouth daily. 03/23/23   [provider]  nitroGLYCERIN (NITROSTAT) 0.4 MG SL tablet Place 0.4 mg under the tongue every 5 (five) minutes as needed for chest pain.    [provider]  nystatin (MYCOSTATIN/NYSTOP) powder Apply 1 Application topically 2 (two) times daily as needed (skin irritation). Apply to the groin in the morning and evening    [provider]  ondansetron (ZOFRAN) 4 MG tablet Take 4 mg by mouth every 8 (eight) hours as needed for nausea or vomiting.    [provider]  oxyCODONE-acetaminophen (PERCOCET/ROXICET) 5-325 MG tablet Take 1 tablet by mouth every 6 (six) hours as needed for severe pain.    [provider]  pantoprazole (PROTONIX) 40 MG tablet TAKE 1 TABLET BY MOUTH EVERY DAY Patient taking differently: Take 40 mg by mouth in the morning. 11/25/19   [name], MD  QUEtiapine (SEROQUEL) 50 MG tablet Take 50 mg by mouth daily.  02/23/23   [provider]  senna-docusate (SENOKOT-S) 8.6-50 MG tablet Take 1 tablet by mouth 2 (two) times daily as needed for moderate constipation. 01/09/23   Almon Hercules, MD  tamsulosin (FLOMAX) 0.4 MG CAPS capsule Take 1 capsule (0.4 mg total) by mouth daily after breakfast. Patient taking differently: Take 0.4 mg by mouth at bedtime. 01/02/23   Lorin Glass, MD      Allergies    Cilostazol, Dulaglutide, Levofloxacin, Liraglutide, and Lisinopril    Review of Systems   Review of Systems  Unable to perform ROS: Dementia  Musculoskeletal:        Left leg bruised and swollen from ankle up    Physical Exam Updated Vital Signs BP 127/61   Pulse 83   Temp 98.5 F (36.9 C)   Resp 17   Ht 5\' 6"  (1.676 m)   Wt 75 kg   SpO2 98%   BMI 26.69 kg/m  Physical Exam Vitals and nursing note reviewed. Exam conducted with a chaperone present Cytogeneticist).  Constitutional:      Appearance: He is overweight. He is not ill-appearing or toxic-appearing.  HENT:     Head: Normocephalic and atraumatic.     Nose: Nose normal.     Mouth/Throat:     Mouth: Mucous membranes are moist.     Pharynx: No oropharyngeal exudate or posterior oropharyngeal erythema.  Eyes:     General: Lids are normal. Vision grossly intact.        Right eye: No discharge.        Left eye: No discharge.     Conjunctiva/sclera: Conjunctivae normal.     Pupils: Pupils are equal, round, and reactive to light.  Neck:     Trachea: Trachea and phonation normal.  Cardiovascular:     Rate and Rhythm: Normal rate and regular rhythm.     Pulses:          Dorsalis pedis pulses are 1+ on the right side and 1+ on the left side.  Pulmonary:     Effort: Pulmonary effort is normal. No tachypnea, bradypnea, accessory muscle usage or respiratory distress.     Breath sounds: Normal breath sounds. No wheezing or rales.  Chest:     Chest wall: No mass, lacerations, deformity, swelling, tenderness, crepitus or edema.   Abdominal:     General: Bowel sounds are normal. There is no distension.     Palpations: Abdomen is soft.     Tenderness: There is no abdominal tenderness. There is no guarding or rebound.  Musculoskeletal:        General: No swelling or deformity.     Cervical back: Normal range of motion and neck supple.     Right hip: Normal.     Left hip: Normal range of motion. Normal strength.     Right upper leg: Normal.     Left knee: Ecchymosis and bony tenderness present. Decreased range  of motion. Tenderness present.     Right lower leg: No edema.     Left lower leg: 2+ Edema (to the knee) present.     Right ankle: Normal.     Right Achilles Tendon: Normal.     Left ankle: Normal.     Left Achilles Tendon: Normal.     Right foot: Normal.     Left foot: Normal.       Legs:  Lymphadenopathy:     Cervical: No cervical adenopathy.  Skin:    General: Skin is warm and dry.     Capillary Refill: Capillary refill takes less than 2 seconds.     Findings: Ecchymosis present.  Neurological:     General: No focal deficit present.     Mental Status: He is alert. Mental status is at baseline.  Psychiatric:        Mood and Affect: Mood normal.     ED Results / Procedures / Treatments   Labs (all labs ordered are listed, but only abnormal results are displayed) Labs Reviewed - No data to display  EKG None  Radiology No results found.  Procedures Procedures    Medications Ordered in ED Medications - No data to display  ED Course/ Medical Decision Making/ A&P                                Medical Decision Making 77 year old male presents with concern for bruising and swelling to the left leg.  Hypertensive on intake, vitals otherwise normal.  Cardiopulmonary exam is unremarkable, abdominal exam is benign.  Patient with small bruises on the abdomen secondary to insulin injections, extensive ecchymosis over the left lower extremity without crepitus.  Compartments are soft and  patient is neurovascularly intact in the foot on the left.    Amount and/or Complexity of Data Reviewed Labs: ordered.    Details: CBC with mild leukocytosis of 10.9 improved from prior, mild anemia with hemoglobin 10.6 near patient's baseline.  (CMP with creatinine 1.8 at patient's baseline, total bili 1.4 at patient's baseline.  INR elevated 1.5 in context of anticoagulation with Eliquis.  Radiology: ordered.    Details:  CT head negative for acute cranial abnormality.  Plan films of bilateral hips are unremarkable for acute traumatic injury.     With exact etiology patient presenting with unclear, suspect recent fall in context of anticoagulation.  No evidence of emergent injury or condition that would warrant further ED workup or inpatient management.  Patient is medically cleared and hemodynamically stable to return back to his facility.  This chart was dictated using voice recognition software, Dragon. Despite the best efforts of this provider to proofread and correct errors, errors may still occur which can change documentation meaning.   Final Clinical Impression(s) / ED Diagnoses Final diagnoses:  None    Rx / DC Orders ED Discharge Orders     None         Sherrilee Gilles 04/15/23 0355    Palumbo, April, MD 04/15/23 0410

## 2023-04-15 NOTE — Discharge Instructions (Signed)
No acute injuries identified on Fernando Hess's workup today.  Please ice his leg, keep elevated.  The bruising will likely remain for the next couple of weeks but progressively improve. Follow up with his primary care doctor and return to the ER with any new severe symptoms.

## 2023-04-15 NOTE — ED Notes (Signed)
Attempted to call [facility], receiving nurse Nia,RN states that she was busy at the moment and would give a call back. Nurse made aware that Pt was en route to facility via [facility].

## 2023-05-18 ENCOUNTER — Encounter (HOSPITAL_COMMUNITY): Payer: Self-pay | Admitting: Emergency Medicine

## 2023-05-18 ENCOUNTER — Emergency Department (HOSPITAL_COMMUNITY)
Admission: EM | Admit: 2023-05-18 | Discharge: 2023-05-18 | Disposition: A | Discharge status: Home / Self Care | Payer: Medicare Other | Attending: Emergency Medicine | Admitting: Emergency Medicine

## 2023-05-18 ENCOUNTER — Other Ambulatory Visit: Payer: Self-pay

## 2023-05-18 ENCOUNTER — Emergency Department (HOSPITAL_COMMUNITY): Payer: Medicare Other

## 2023-05-18 DIAGNOSIS — M79605 Pain in left leg: Secondary | ICD-10-CM | POA: Insufficient documentation

## 2023-05-18 DIAGNOSIS — Z7901 Long term (current) use of anticoagulants: Secondary | ICD-10-CM | POA: Diagnosis not present

## 2023-05-18 DIAGNOSIS — W19XXXA Unspecified fall, initial encounter: Secondary | ICD-10-CM | POA: Diagnosis not present

## 2023-05-18 DIAGNOSIS — M25552 Pain in left hip: Secondary | ICD-10-CM | POA: Diagnosis present

## 2023-05-18 DIAGNOSIS — M25562 Pain in left knee: Secondary | ICD-10-CM | POA: Diagnosis not present

## 2023-05-18 DIAGNOSIS — S8012XA Contusion of left lower leg, initial encounter: Secondary | ICD-10-CM

## 2023-05-18 DIAGNOSIS — Z794 Long term (current) use of insulin: Secondary | ICD-10-CM | POA: Diagnosis not present

## 2023-05-18 NOTE — ED Notes (Signed)
Significant other calls to report concern for pt retuning home and inability to provide care for him.

## 2023-05-18 NOTE — ED Triage Notes (Signed)
Patient BIB EMS from home c/o mechanical fall. Per report pt fell on his knees while using his walker. Patient denies LOC. Patient taking blood thinners. Patient a/o x4.

## 2023-05-18 NOTE — ED Provider Notes (Signed)
[facility] [place] EMERGENCY DEPARTMENT AT [facility] Provider Note   CSN: [id number] Arrival date & time: 05/18/23  1533     History  Chief Complaint  Patient presents with   Fernando Hess is a 77 y.o. male.  77 year old male presents with mechanical fall just prior to arrival.  Patient says he has a myasthenia gravis.  He is in a wheelchair and can use a four-wheel walker occasionally.  Notes that today when trying to transfer his left knee became weak and he fell forward.  He is on Eliquis but did not strike his head.  Did not hit his chest or abdomen.  Notes some slight left-sided hip pain but mostly has pain to his left femur as well as his left knee.  No recent illnesses.  No recent medication changes.       Home Medications Prior to Admission medications   Medication Sig Start Date End Date Taking? Authorizing Provider  acetaminophen (TYLENOL) 500 MG tablet Take 1,000 mg by mouth every 6 (six) hours as needed for moderate pain.    [provider]  Amino Acids-Protein Hydrolys (PRO-STAT) LIQD Take 30 mLs by mouth in the morning and at bedtime.    [provider]  apixaban (ELIQUIS) 5 MG TABS tablet Take 1 tablet (5 mg total) by mouth 2 (two) times daily. 01/01/23   Lorin Glass, MD  ascorbic acid (VITAMIN C) 500 MG tablet Take 1 tablet (500 mg total) by mouth daily. 03/02/23   Regalado, Belkys A, MD  atorvastatin (LIPITOR) 80 MG tablet TAKE ONE TABLET BY MOUTH EVERY EVENING Patient taking differently: Take 80 mg by mouth at bedtime. 11/13/22   Tereso Newcomer T, [name]  buPROPion (WELLBUTRIN XL) 300 MG 24 hr tablet Take 300 mg by mouth every morning. 02/19/23   [provider]  cyanocobalamin (VITAMIN B12) 1000 MCG/ML injection Inject 1,000 mcg into the muscle once a week. saturdays    [provider]  DULoxetine (CYMBALTA) 30 MG capsule Take 1 capsule (30 mg total) by mouth every morning. 01/13/23   Rhetta Mura, MD  GVOKE  HYPOPEN 2-PACK 1 MG/0.2ML SOAJ Inject into the skin. 04/03/23   [provider]  hydrALAZINE (APRESOLINE) 25 MG tablet Take 1 tablet (25 mg total) by mouth every 6 (six) hours as needed (SBP>160 or DBP>110). 01/12/23   Rhetta Mura, MD  LANTUS SOLOSTAR 100 UNIT/ML Solostar Pen Inject 40 Units into the skin daily. 02/19/23   [provider]  linagliptin (TRADJENTA) 5 MG TABS tablet Take 1 tablet (5 mg total) by mouth daily. 03/02/23   Regalado, Belkys A, MD  MAGNESIUM PO Take 1 tablet by mouth daily.    [provider]  Multiple Vitamins-Minerals (SENIOR MULTIVITAMIN PLUS PO) Take 1 tablet by mouth daily with breakfast.    [provider]  MYRBETRIQ 25 MG TB24 tablet Take 25 mg by mouth daily. 03/23/23   [provider]  nitroGLYCERIN (NITROSTAT) 0.4 MG SL tablet Place 0.4 mg under the tongue every 5 (five) minutes as needed for chest pain.    [provider]  ondansetron (ZOFRAN-ODT) 4 MG disintegrating tablet Take 4 mg by mouth every 8 (eight) hours as needed for nausea or vomiting. 04/08/23   [provider]  oxyCODONE-acetaminophen (PERCOCET/ROXICET) 5-325 MG tablet Take 1 tablet by mouth every 6 (six) hours as needed for severe pain.    [provider]  pantoprazole (PROTONIX) 40 MG tablet TAKE 1 TABLET BY MOUTH  EVERY DAY Patient taking differently: Take 40 mg by mouth in the morning. 11/25/19   [name], MD  QUEtiapine (SEROQUEL) 50 MG tablet Take 50 mg by mouth daily. 02/23/23   [provider]  senna-docusate (SENOKOT-S) 8.6-50 MG tablet Take 1 tablet by mouth 2 (two) times daily as needed for moderate constipation. 01/09/23   Almon Hercules, MD  tamsulosin (FLOMAX) 0.4 MG CAPS capsule Take 1 capsule (0.4 mg total) by mouth daily after breakfast. Patient taking differently: Take 0.4 mg by mouth daily. 01/02/23   Lorin Glass, MD      Allergies    Cilostazol, Dulaglutide, Levofloxacin, Liraglutide, and  Lisinopril    Review of Systems   Review of Systems  All other systems reviewed and are negative.   Physical Exam Updated Vital Signs BP 133/75 (BP Location: Right Arm)   Pulse 85   Temp 98.1 F (36.7 C) (Oral)   Resp 16   Ht 1.676 m (5\' 6" )   Wt 75 kg   SpO2 94%   BMI 26.69 kg/m  Physical Exam Vitals and nursing note reviewed.  Constitutional:      General: He is not in acute distress.    Appearance: Normal appearance. He is well-developed. He is not toxic-appearing.  HENT:     Head: Normocephalic and atraumatic.  Eyes:     General: Lids are normal.     Conjunctiva/sclera: Conjunctivae normal.     Pupils: Pupils are equal, round, and reactive to light.  Neck:     Thyroid: No thyroid mass.     Trachea: No tracheal deviation.  Cardiovascular:     Rate and Rhythm: Normal rate and regular rhythm.     Heart sounds: Normal heart sounds. No murmur heard.    No gallop.  Pulmonary:     Effort: Pulmonary effort is normal. No respiratory distress.     Breath sounds: Normal breath sounds. No stridor. No decreased breath sounds, wheezing, rhonchi or rales.  Abdominal:     General: There is no distension.     Palpations: Abdomen is soft.     Tenderness: There is no abdominal tenderness. There is no rebound.  Musculoskeletal:     Cervical back: Normal range of motion and neck supple.     Left knee: Decreased range of motion. Tenderness present.       Legs:  Skin:    General: Skin is warm and dry.     Findings: No abrasion or rash.  Neurological:     Mental Status: He is alert and oriented to person, place, and time. Mental status is at baseline.     GCS: GCS eye subscore is 4. GCS verbal subscore is 5. GCS motor subscore is 6.     Cranial Nerves: Cranial nerves are intact. No cranial nerve deficit.     Sensory: No sensory deficit.     Motor: Motor function is intact.  Psychiatric:        Attention and Perception: Attention normal.        Speech: Speech normal.         Behavior: Behavior normal.     ED Results / Procedures / Treatments   Labs (all labs ordered are listed, but only abnormal results are displayed) Labs Reviewed - No data to display  EKG None  Radiology No results found.  Procedures Procedures    Medications Ordered in ED Medications - No data to display  ED Course/ Medical Decision Making/ A&P  Medical Decision Making Amount and/or Complexity of Data Reviewed Radiology: ordered.   Plain x-rays of patient's left hip, left femur, knee are negative.  Will discharge back to his facility        Final Clinical Impression(s) / ED Diagnoses Final diagnoses:  None    Rx / DC Orders ED Discharge Orders     None         Lorre Nick, MD 05/18/23 1806

## 2023-05-29 ENCOUNTER — Other Ambulatory Visit: Payer: Self-pay

## 2023-05-29 ENCOUNTER — Encounter (HOSPITAL_COMMUNITY): Payer: Self-pay

## 2023-05-29 ENCOUNTER — Emergency Department (HOSPITAL_COMMUNITY): Payer: Medicare Other

## 2023-05-29 ENCOUNTER — Emergency Department (HOSPITAL_COMMUNITY)
Admission: EM | Admit: 2023-05-29 | Discharge: 2023-06-02 | Disposition: A | Discharge status: Skilled Nursing Facility | Payer: Medicare Other | Attending: Emergency Medicine | Admitting: Emergency Medicine

## 2023-05-29 DIAGNOSIS — R531 Weakness: Secondary | ICD-10-CM | POA: Diagnosis not present

## 2023-05-29 DIAGNOSIS — N189 Chronic kidney disease, unspecified: Secondary | ICD-10-CM | POA: Insufficient documentation

## 2023-05-29 DIAGNOSIS — I2510 Atherosclerotic heart disease of native coronary artery without angina pectoris: Secondary | ICD-10-CM | POA: Insufficient documentation

## 2023-05-29 DIAGNOSIS — Z8669 Personal history of other diseases of the nervous system and sense organs: Secondary | ICD-10-CM

## 2023-05-29 DIAGNOSIS — Z8739 Personal history of other diseases of the musculoskeletal system and connective tissue: Secondary | ICD-10-CM | POA: Insufficient documentation

## 2023-05-29 DIAGNOSIS — Z7901 Long term (current) use of anticoagulants: Secondary | ICD-10-CM | POA: Diagnosis not present

## 2023-05-29 DIAGNOSIS — R251 Tremor, unspecified: Secondary | ICD-10-CM | POA: Insufficient documentation

## 2023-05-29 DIAGNOSIS — Z794 Long term (current) use of insulin: Secondary | ICD-10-CM | POA: Diagnosis not present

## 2023-05-29 DIAGNOSIS — E1122 Type 2 diabetes mellitus with diabetic chronic kidney disease: Secondary | ICD-10-CM | POA: Diagnosis not present

## 2023-05-29 DIAGNOSIS — I5030 Unspecified diastolic (congestive) heart failure: Secondary | ICD-10-CM | POA: Insufficient documentation

## 2023-05-29 LAB — COMPREHENSIVE METABOLIC PANEL
ALT: 25 U/L (ref 0–44)
AST: 23 U/L (ref 15–41)
Albumin: 2.8 g/dL — ABNORMAL LOW (ref 3.5–5.0)
Alkaline Phosphatase: 109 U/L (ref 38–126)
Anion gap: 10 (ref 5–15)
BUN: 15 mg/dL (ref 8–23)
CO2: 27 mmol/L (ref 22–32)
Calcium: 8.4 mg/dL — ABNORMAL LOW (ref 8.9–10.3)
Chloride: 103 mmol/L (ref 98–111)
Creatinine, Ser: 1.75 mg/dL — ABNORMAL HIGH (ref 0.61–1.24)
GFR, Estimated: 39 mL/min — ABNORMAL LOW (ref >60)
Glucose, Bld: 204 mg/dL — ABNORMAL HIGH (ref 70–99)
Potassium: 4.3 mmol/L (ref 3.5–5.1)
Sodium: 140 mmol/L (ref 135–145)
Total Bilirubin: 0.8 mg/dL (ref 0.3–1.2)
Total Protein: 5.5 g/dL — ABNORMAL LOW (ref 6.5–8.1)

## 2023-05-29 LAB — URINALYSIS, ROUTINE W REFLEX MICROSCOPIC
Bilirubin Urine: NEGATIVE
Glucose, UA: >=500 mg/dL — AB
Hgb urine dipstick: NEGATIVE
Ketones, ur: NEGATIVE mg/dL
Leukocytes,Ua: NEGATIVE
Nitrite: NEGATIVE
Protein, ur: 30 mg/dL — AB
Specific Gravity, Urine: 1.015 (ref 1.005–1.030)
pH: 7 (ref 5.0–8.0)

## 2023-05-29 LAB — CBC
HCT: 44.6 % (ref 39.0–52.0)
Hemoglobin: 14.2 g/dL (ref 13.0–17.0)
MCH: 29.5 pg (ref 26.0–34.0)
MCHC: 31.8 g/dL (ref 30.0–36.0)
MCV: 92.7 fL (ref 80.0–100.0)
Platelets: 251 10*3/uL (ref 150–400)
RBC: 4.81 MIL/uL (ref 4.22–5.81)
RDW: 14.7 % (ref 11.5–15.5)
WBC: 10 10*3/uL (ref 4.0–10.5)
nRBC: 0 % (ref 0.0–0.2)

## 2023-05-29 LAB — URINALYSIS, MICROSCOPIC (REFLEX)

## 2023-05-29 LAB — CBG MONITORING, ED: Glucose-Capillary: 221 mg/dL — ABNORMAL HIGH (ref 70–99)

## 2023-05-29 MED ORDER — VITAMIN C 500 MG PO TABS
500.0000 mg | ORAL_TABLET | Freq: Every day | ORAL | Status: DC
  Administered 2023-05-29 – 2023-06-02 (×5): 500 mg via ORAL
  Filled 2023-05-29 (×5): qty 1

## 2023-05-29 MED ORDER — QUETIAPINE FUMARATE 50 MG PO TABS
50.0000 mg | ORAL_TABLET | Freq: Every day | ORAL | Status: DC
  Administered 2023-05-29 – 2023-06-02 (×5): 50 mg via ORAL
  Filled 2023-05-29 (×2): qty 1
  Filled 2023-05-29: qty 2
  Filled 2023-05-29 (×2): qty 1

## 2023-05-29 MED ORDER — MIRABEGRON ER 25 MG PO TB24
25.0000 mg | ORAL_TABLET | Freq: Every day | ORAL | Status: DC
  Administered 2023-05-29 – 2023-06-02 (×5): 25 mg via ORAL
  Filled 2023-05-29 (×6): qty 1

## 2023-05-29 MED ORDER — CYANOCOBALAMIN 1000 MCG/ML IJ SOLN
1000.0000 ug | INTRAMUSCULAR | Status: DC
  Administered 2023-05-29: 1000 ug via INTRAMUSCULAR
  Filled 2023-05-29: qty 1

## 2023-05-29 MED ORDER — ACETAMINOPHEN 500 MG PO TABS
1000.0000 mg | ORAL_TABLET | Freq: Four times a day (QID) | ORAL | Status: DC | PRN
  Administered 2023-05-30: 1000 mg via ORAL
  Filled 2023-05-29: qty 2

## 2023-05-29 MED ORDER — FUROSEMIDE 20 MG PO TABS
20.0000 mg | ORAL_TABLET | Freq: Every day | ORAL | Status: DC
  Administered 2023-05-29 – 2023-06-02 (×5): 20 mg via ORAL
  Filled 2023-05-29 (×5): qty 1

## 2023-05-29 MED ORDER — BUDESONIDE 0.25 MG/2ML IN SUSP
0.2500 mg | Freq: Every day | RESPIRATORY_TRACT | Status: DC
  Administered 2023-05-29 – 2023-06-02 (×5): 0.25 mg via RESPIRATORY_TRACT
  Filled 2023-05-29 (×7): qty 2

## 2023-05-29 MED ORDER — SENNOSIDES-DOCUSATE SODIUM 8.6-50 MG PO TABS: 1.0000 | ORAL_TABLET | Freq: Two times a day (BID) | ORAL | Status: DC | PRN

## 2023-05-29 MED ORDER — HYDRALAZINE HCL 25 MG PO TABS: 25.0000 mg | ORAL_TABLET | Freq: Four times a day (QID) | ORAL | Status: DC | PRN

## 2023-05-29 MED ORDER — DULOXETINE HCL 60 MG PO CPEP
60.0000 mg | ORAL_CAPSULE | Freq: Every morning | ORAL | Status: DC
  Administered 2023-05-30 – 2023-06-02 (×4): 60 mg via ORAL
  Filled 2023-05-29 (×4): qty 1

## 2023-05-29 MED ORDER — APIXABAN 5 MG PO TABS
5.0000 mg | ORAL_TABLET | Freq: Two times a day (BID) | ORAL | Status: DC
  Administered 2023-05-29 – 2023-06-02 (×8): 5 mg via ORAL
  Filled 2023-05-29 (×8): qty 1

## 2023-05-29 MED ORDER — ATORVASTATIN CALCIUM 80 MG PO TABS
80.0000 mg | ORAL_TABLET | Freq: Every day | ORAL | Status: DC
  Administered 2023-05-29 – 2023-06-01 (×4): 80 mg via ORAL
  Filled 2023-05-29: qty 1
  Filled 2023-05-29 (×2): qty 2
  Filled 2023-05-29: qty 1

## 2023-05-29 MED ORDER — DULOXETINE HCL 30 MG PO CPEP: 60.0000 mg | ORAL_CAPSULE | Freq: Every morning | ORAL | 0 refills | Status: DC

## 2023-05-29 MED ORDER — TAMSULOSIN HCL 0.4 MG PO CAPS
0.4000 mg | ORAL_CAPSULE | Freq: Every day | ORAL | Status: DC
  Administered 2023-05-29 – 2023-06-02 (×5): 0.4 mg via ORAL
  Filled 2023-05-29 (×5): qty 1

## 2023-05-29 MED ORDER — PANTOPRAZOLE SODIUM 40 MG PO TBEC
40.0000 mg | DELAYED_RELEASE_TABLET | Freq: Every morning | ORAL | Status: DC
  Administered 2023-05-30 – 2023-06-01 (×3): 40 mg via ORAL
  Filled 2023-05-29 (×4): qty 1

## 2023-05-29 MED ORDER — ACETAMINOPHEN-CODEINE 300-30 MG PO TABS
1.0000 | ORAL_TABLET | Freq: Once | ORAL | Status: AC
  Administered 2023-05-29: 1 via ORAL
  Filled 2023-05-29: qty 1

## 2023-05-29 MED ORDER — PROSOURCE PLUS PO LIQD
30.0000 mL | Freq: Two times a day (BID) | ORAL | Status: DC
  Administered 2023-05-29 – 2023-05-31 (×3): 30 mL via ORAL
  Filled 2023-05-29 (×9): qty 30

## 2023-05-29 MED ORDER — TIZANIDINE HCL 4 MG PO TABS
2.0000 mg | ORAL_TABLET | ORAL | Status: DC | PRN
  Administered 2023-05-30: 2 mg via ORAL
  Filled 2023-05-29: qty 1

## 2023-05-29 MED ORDER — INSULIN GLARGINE-YFGN 100 UNIT/ML ~~LOC~~ SOLN
40.0000 [IU] | Freq: Every day | SUBCUTANEOUS | Status: DC
  Administered 2023-05-29 – 2023-05-31 (×3): 40 [IU] via SUBCUTANEOUS
  Filled 2023-05-29 (×5): qty 0.4

## 2023-05-29 MED ORDER — INSULIN ASPART 100 UNIT/ML IJ SOLN
5.0000 [IU] | Freq: Three times a day (TID) | INTRAMUSCULAR | Status: DC
  Administered 2023-05-30 – 2023-06-02 (×8): 5 [IU] via SUBCUTANEOUS

## 2023-05-29 NOTE — Evaluation (Signed)
Physical Therapy Evaluation Patient Details Name: Fernando Hess MRN: [id number] DOB: [birth year] Today's Date: 05/29/2023  History of Present Illness  77 year old male  presenting to the emergency department after a fall. Patient states that yesterday he had a fall due to his weakness from his myasthenia gravis. imaging of head/spine and femur negative. 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   Clinical Impression  Fernando Hess is 77 y.o. male admitted with above HPI and diagnosis. Patient is currently limited by functional impairments below (see PT problem list). Patient lives with spouse and is reports at baseline min assist for ADLs form home aid and min assist to mod ind with RW and WC for mobility; questionable historian for accuracy of CLOF. Pt does report he has been very limited and cannot get around well since returning home from from SNF. He is greatly limited by whole body tremors at rest and increased with active motion. Mod assist for rolling and pt would require Max+2 for safety with supine>sit EOB due to tremors and LE rigidity. Patient will benefit from continued skilled PT interventions to address impairments and progress independence with mobility. Patient will benefit from continued inpatient follow up therapy, <3 hours/day. Acute PT will follow and progress as able.         If plan is discharge home, recommend the following: Two people to help with walking and/or transfers;Two people to help with bathing/dressing/bathroom;Help with stairs or ramp for entrance;Assist for transportation;Assistance with cooking/housework   Can travel by private vehicle   No    Equipment Recommendations Hospital bed;Hoyer lift  Recommendations for Other Services       Functional Status Assessment Patient has had a recent decline in their functional status and demonstrates the ability to make  significant improvements in function in a reasonable and predictable amount of time.     Precautions / Restrictions Precautions Precautions: Fall Precaution Comments: tremors all extremities and then noted rigidity LE's (HIGH fall risk) Restrictions Weight Bearing Restrictions: No      Mobility  Bed Mobility Overal bed mobility: Needs Assistance Bed Mobility: Supine to Sit, Sit to Supine, Rolling Rolling: Min assist, Mod assist   Supine to sit: Max assist Sit to supine: Max assist   General bed mobility comments: min-mod assist with pt using bed rail to roll Rt/Lt. pt limited greatly by whole body tremors with active movement to attempt supine<>sit. Max Assist to move 50% through sitting EOB, bil LE's extending and Max assist to return to supine. recommend +2 for safety.    Transfers                        Ambulation/Gait                  Stairs            Wheelchair Mobility     Tilt Bed    Modified Rankin (Stroke Patients Only)       Balance Overall balance assessment: History of Falls Sitting-balance support: Feet supported, Bilateral upper extremity supported Sitting balance-Leahy Scale: Zero Sitting balance - Comments: reliant on external Max/Total assist support to maintain balance at EOB.                                     Pertinent Vitals/Pain  Pain Assessment Pain Assessment: Faces Faces Pain Scale: Hurts even more Pain Location: Lt thigh ant/post Pain Descriptors / Indicators: Grimacing, Sore Pain Intervention(s): Limited activity within patient's tolerance, Monitored during session, Repositioned    Home Living Family/patient expects to be discharged to:: Private residence Living Arrangements: Spouse/significant other Available Help at Discharge: Family;Personal care attendant;Available PRN/intermittently Type of Home: House Home Access: Ramped entrance;Stairs to enter Entrance Stairs-Rails: Left Entrance  Stairs-Number of Steps: 5   Home Layout: Two level;Able to live on main level with bedroom/bathroom Home Equipment: Rollator (4 wheels);Shower seat;Grab bars - tub/shower;BSC/3in1;Wheelchair - manual Additional Comments: Has aide that comes 3 days/week for 3 hours to assist pt and his wife with ADL/IADLs and homemaking. pt's wife does some home tasks but he is unable to assist with them. pt reports his wife often asks him to assist but he is unable to assist.    Prior Function Prior Level of Function : Needs assist       Physical Assist : Mobility (physical);ADLs (physical) Mobility (physical): Transfers;Gait;Stairs;Bed mobility ADLs (physical): Bathing;Dressing;Toileting;Grooming;IADLs Mobility Comments: reported only transfers with RW to [facility] and mobilizes in [facility] at home using bil UE/LE's. Endorses multiple falls. ADLs Comments: pt reports aid assists him with all bathing/dressing and he often needs assist for mobility. he is using RW for short bouts of gait at home and mostly WC for mobility. unclear if pt is use RW for transfers only mostly or if he was ambulating at all with therapy at SNF and at home.     Extremity/Trunk Assessment   Upper Extremity Assessment Upper Extremity Assessment: Defer to OT evaluation;LUE deficits/detail;RUE deficits/detail RUE Deficits / Details: tremulous bil UEs,  strength grossly 3+/5 RUE Coordination: decreased gross motor;decreased fine motor LUE Deficits / Details: tremulous bil UEs, strength grossly 3+/5 LUE Coordination: decreased gross motor;decreased fine motor    Lower Extremity Assessment Lower Extremity Assessment: RLE deficits/detail;LLE deficits/detail RLE Deficits / Details: 2+/5 or less throughout grossly, tremulous at rest and increased with active movement RLE Coordination: decreased gross motor;decreased fine motor LLE Deficits / Details: 2+/5 or less throughout grossly, tremulous at rest and increased with active movement LLE  Coordination: decreased gross motor;decreased fine motor    Cervical / Trunk Assessment Cervical / Trunk Assessment: Kyphotic  Communication   Communication Communication: Difficulty communicating thoughts/reduced clarity of speech (from SOB and soft spoken)  Cognition Arousal: Alert Behavior During Therapy: Flat affect Overall Cognitive Status: Impaired/Different from baseline Area of Impairment: Attention, Following commands, Safety/judgement, Awareness, Problem solving                   Current Attention Level: Selective   Following Commands: Follows multi-step commands inconsistently, Follows one step commands with increased time, Follows one step commands consistently Safety/Judgement: Decreased awareness of safety Awareness: Emergent Problem Solving: Slow processing, Decreased initiation, Requires verbal cues, Difficulty sequencing General Comments: pt providing mostly accurate timeline of rehab and hospital admissions and return home. questionable for accuracy of CLOF prior to this admission.        General Comments      Exercises     Assessment/Plan    PT Assessment Patient needs continued PT services  PT Problem List Decreased strength;Decreased activity tolerance;Decreased balance;Decreased mobility;Decreased knowledge of precautions;Decreased coordination;Decreased range of motion;Decreased cognition;Decreased safety awareness;Impaired tone       PT Treatment Interventions DME instruction;Functional mobility training;Therapeutic activities;Patient/family education;Therapeutic exercise;Gait training;Balance training;Neuromuscular re-education;Cognitive remediation;Wheelchair mobility training;Manual techniques    PT Goals (Current goals can be found in the  Care Plan section)  Acute Rehab PT Goals Patient Stated Goal: get to rehab and eventually ALF PT Goal Formulation: With patient Time For Goal Achievement: 06/12/23 Potential to Achieve Goals: Fair     Frequency Min 1X/week     Co-evaluation               AM-PAC PT "6 Clicks" Mobility  Outcome Measure Help needed turning from your back to your side while in a flat bed without using bedrails?: Total Help needed moving from lying on your back to sitting on the side of a flat bed without using bedrails?: Total Help needed moving to and from a bed to a chair (including a wheelchair)?: Total Help needed standing up from a chair using your arms (e.g., wheelchair or bedside chair)?: Total Help needed to walk in hospital room?: Total Help needed climbing 3-5 steps with a railing? : Total 6 Click Score: 6    End of Session Equipment Utilized During Treatment: Gait belt Activity Tolerance: Patient limited by fatigue Patient left: in bed;with call bell/phone within reach Nurse Communication: Other (comment);Mobility status (pill found in bed) PT Visit Diagnosis: Other abnormalities of gait and mobility (R26.89);Difficulty in walking, not elsewhere classified (R26.2);Muscle weakness (generalized) (M62.81);Ataxic gait (R26.0);History of falling (Z91.81);Other symptoms and signs involving the nervous system (R29.898)    Time: 1610-9604 PT Time Calculation (min) (ACUTE ONLY): 29 min   Charges:   PT Evaluation $PT Eval Moderate Complexity: 1 Mod PT Treatments $Therapeutic Activity: 8-22 mins PT General Charges $$ ACUTE PT VISIT: 1 Visit         Wynn Maudlin, DPT Acute Rehabilitation Services Office [phone]  05/29/23 4:58 PM

## 2023-05-29 NOTE — ED Notes (Addendum)
Pt care taken, resting no complaints at this time.

## 2023-05-29 NOTE — ED Notes (Signed)
Called RT to notify that pt has OSA and wears CPAP at night. Per RT, night shift will be notified and order obtained for CPAP

## 2023-05-29 NOTE — ED Notes (Signed)
Per report from EDP, Pt has myasthenia gravis and is taken care of by his wife at home. Pt's wife feels that she can no longer care for him at home. Pt also has a bad tremor from his duloxetine.

## 2023-05-29 NOTE — Progress Notes (Signed)
   05/29/23 2304  BiPAP/CPAP/SIPAP  $ Non-Invasive Ventilator  Non-Invasive Vent Subsequent  BiPAP/CPAP/SIPAP Pt Type Adult  BiPAP/CPAP/SIPAP Resmed  Reason BIPAP/CPAP not in use Other(comment) (Pt wears on and off day/night)  Mask Type Full face mask  Mask Size Medium  PEEP 5 cmH20  FiO2 (%) 21 %

## 2023-05-29 NOTE — ED Provider Notes (Signed)
[place] EMERGENCY DEPARTMENT AT [facility] Provider Note   CSN: [id number] Arrival date & time: 05/29/23  1056     History  Chief Complaint  Patient presents with   Seizures    Fernando Hess is a 77 y.o. male.  77 yo M with hx of myasthenia gravis, CAD sp CABG, HFpE, atrial fibrillation on eliquis, CKD, and diabetes who presents with weakness.  The patient was recently taken home from [facility] this week where he was doing rehab.  Was taken home by his wife due to COVID outbreak.  Since going home has had difficulty walking around the house due to a tremor that occurs due to movement and generalized weakness.  Wife states that he is unable to bathe himself, close himself, or feed himself.  Requires assistance to get around the house and they were able to obtain physical therapy and home health at home.  She is worried this is due to changes in his duloxetine.  She reports that there was initially decreased from 90 mg to 60 mg and then this week his primary doctor on Thursday increased him to 90 mg of his duloxetine and stop Wellbutrin that he was on previously.  Feels that she is unable to take care of him.        Home Medications Prior to Admission medications   Medication Sig Start Date End Date Taking? Authorizing Provider  acetaminophen (TYLENOL) 500 MG tablet Take 1,000 mg by mouth every 6 (six) hours as needed for moderate pain.    [provider]  Amino Acids-Protein Hydrolys (PRO-STAT) LIQD Take 30 mLs by mouth in the morning and at bedtime.    [provider]  apixaban (ELIQUIS) 5 MG TABS tablet Take 1 tablet (5 mg total) by mouth 2 (two) times daily. 01/01/23   Lorin Glass, MD  ascorbic acid (VITAMIN C) 500 MG tablet Take 1 tablet (500 mg total) by mouth daily. 03/02/23   Regalado, Belkys A, MD  atorvastatin (LIPITOR) 80 MG tablet TAKE ONE TABLET BY MOUTH EVERY EVENING Patient taking differently: Take 80 mg by mouth at bedtime. 11/13/22    Tereso Newcomer T, [name]  buPROPion (WELLBUTRIN XL) 300 MG 24 hr tablet Take 300 mg by mouth every morning. 02/19/23   [provider]  cyanocobalamin (VITAMIN B12) 1000 MCG/ML injection Inject 1,000 mcg into the muscle once a week. saturdays    [provider]  DULoxetine (CYMBALTA) 30 MG capsule Take 2 capsules (60 mg total) by mouth every morning. 05/29/23   Rondel Baton, MD  GVOKE HYPOPEN 2-PACK 1 MG/0.2ML SOAJ Inject into the skin. 04/03/23   [provider]  hydrALAZINE (APRESOLINE) 25 MG tablet Take 1 tablet (25 mg total) by mouth every 6 (six) hours as needed (SBP>160 or DBP>110). 01/12/23   Rhetta Mura, MD  LANTUS SOLOSTAR 100 UNIT/ML Solostar Pen Inject 40 Units into the skin daily. 02/19/23   [provider]  linagliptin (TRADJENTA) 5 MG TABS tablet Take 1 tablet (5 mg total) by mouth daily. 03/02/23   Regalado, Belkys A, MD  MAGNESIUM PO Take 1 tablet by mouth daily.    [provider]  Multiple Vitamins-Minerals (SENIOR MULTIVITAMIN PLUS PO) Take 1 tablet by mouth daily with breakfast.    [provider]  MYRBETRIQ 25 MG TB24 tablet Take 25 mg by mouth daily. 03/23/23   [provider]  nitroGLYCERIN (NITROSTAT) 0.4 MG SL tablet Place 0.4 mg under the tongue every 5 (five)  minutes as needed for chest pain.    [provider]  ondansetron (ZOFRAN-ODT) 4 MG disintegrating tablet Take 4 mg by mouth every 8 (eight) hours as needed for nausea or vomiting. 04/08/23   [provider]  oxyCODONE-acetaminophen (PERCOCET/ROXICET) 5-325 MG tablet Take 1 tablet by mouth every 6 (six) hours as needed for severe pain.    [provider]  pantoprazole (PROTONIX) 40 MG tablet TAKE 1 TABLET BY MOUTH EVERY DAY Patient taking differently: Take 40 mg by mouth in the morning. 11/25/19   [name], MD  QUEtiapine (SEROQUEL) 50 MG tablet Take 50 mg by mouth daily. 02/23/23   [provider]  senna-docusate  (SENOKOT-S) 8.6-50 MG tablet Take 1 tablet by mouth 2 (two) times daily as needed for moderate constipation. 01/09/23   Almon Hercules, MD  tamsulosin (FLOMAX) 0.4 MG CAPS capsule Take 1 capsule (0.4 mg total) by mouth daily after breakfast. Patient taking differently: Take 0.4 mg by mouth daily. 01/02/23   Lorin Glass, MD      Allergies    Cilostazol, Dulaglutide, Levofloxacin, Liraglutide, and Lisinopril    Review of Systems   Review of Systems  Physical Exam Updated Vital Signs BP (!) 176/90   Pulse 75   Temp 99.1 F (37.3 C) (Oral)   Resp 13   Ht 5\' 6"  (1.676 m)   Wt 75 kg   SpO2 99%   BMI 26.69 kg/m  Physical Exam Vitals and nursing note reviewed.  Constitutional:      General: He is not in acute distress.    Appearance: He is well-developed.     Comments: Conversant.  Alert and oriented x 3  HENT:     Head: Normocephalic and atraumatic.     Right Ear: External ear normal.     Left Ear: External ear normal.     Nose: Nose normal.  Eyes:     Extraocular Movements: Extraocular movements intact.     Conjunctiva/sclera: Conjunctivae normal.     Pupils: Pupils are equal, round, and reactive to light.  Pulmonary:     Effort: Pulmonary effort is normal. No respiratory distress.  Musculoskeletal:     Cervical back: Normal range of motion and neck supple.     Right lower leg: No edema.     Left lower leg: No edema.  Skin:    General: Skin is warm and dry.  Neurological:     Mental Status: He is alert and oriented to person, place, and time. Mental status is at baseline.     Cranial Nerves: No cranial nerve deficit.     Sensory: No sensory deficit.     Motor: No weakness.     Comments: Intention tremor noted  Psychiatric:        Mood and Affect: Mood normal.        Behavior: Behavior normal.     ED Results / Procedures / Treatments   Labs (all labs ordered are listed, but only abnormal results are displayed) Labs Reviewed  COMPREHENSIVE METABOLIC PANEL -  Abnormal; Notable for the following components:      Result Value   Glucose, Bld 204 (*)    Creatinine, Ser 1.75 (*)    Calcium 8.4 (*)    Total Protein 5.5 (*)    Albumin 2.8 (*)    GFR, Estimated 39 (*)    All other components within normal limits  URINALYSIS, ROUTINE W REFLEX MICROSCOPIC - Abnormal; Notable for the following components:  Glucose, UA >=500 (*)    Protein, ur 30 (*)    All other components within normal limits  URINALYSIS, MICROSCOPIC (REFLEX) - Abnormal; Notable for the following components:   Bacteria, UA RARE (*)    All other components within normal limits  CBC    EKG None  Radiology CT Head Wo Contrast  Result Date: 05/29/2023 CLINICAL DATA:  Weakness, head trauma EXAM: CT HEAD WITHOUT CONTRAST TECHNIQUE: Contiguous axial images were obtained from the base of the skull through the vertex without intravenous contrast. RADIATION DOSE REDUCTION: This exam was performed according to the departmental dose-optimization program which includes automated exposure control, adjustment of the mA and/or kV according to patient size and/or use of iterative reconstruction technique. COMPARISON:  04/15/2023 FINDINGS: Brain: No acute infarct or hemorrhage. Lateral ventricles and midline structures are stable. Chronic small vessel ischemic changes are again noted throughout the periventricular white matter. No acute extra-axial fluid collections. No mass effect. Vascular: No hyperdense vessel or unexpected calcification. Skull: Normal. Negative for fracture or focal lesion. Sinuses/Orbits: Mild polypoid mucosal thickening within the maxillary sinuses again noted. Other: None. IMPRESSION: 1. Stable head CT, no acute intracranial process. Electronically Signed   By: Sharlet Salina M.D.   On: 05/29/2023 15:23   DG Chest 2 View  Result Date: 05/29/2023 CLINICAL DATA:  Weakness EXAM: CHEST - 2 VIEW COMPARISON:  X-ray 03/15/2023 FINDINGS: Underinflation. Sternal wires. Bronchovascular  crowding. Enlarged cardiopericardial silhouette with tortuous ectatic aorta. Prominent central vasculature. Basilar atelectasis. No pneumothorax, effusion or consolidation. Motion on lateral view. Scattered degenerative changes. Overlapping cardiac leads. IMPRESSION: Postop chest. Underinflation with enlarged heart and vascular congestion. Basilar atelectasis. Electronically Signed   By: Karen Kays M.D.   On: 05/29/2023 13:55    Procedures Procedures    Medications Ordered in ED Medications  acetaminophen-codeine (TYLENOL #3) 300-30 MG per tablet 1 tablet (has no administration in time range)    ED Course/ Medical Decision Making/ A&P Clinical Course as of 05/29/23 1743  Fri May 29, 2023  1157 Dr Wilford Corner from neurology consulted and does agree that duloxetine can cause the patient's tremor.  Says that this is possible with both increasing and decreasing doses so recommends holding him on his current dose and following up with the prescribing provider. [RP]  1241 Creatinine(!): 1.75 At baseline [RP]    Clinical Course User Index [RP] Rondel Baton, MD                                 Medical Decision Making Amount and/or Complexity of Data Reviewed Labs: ordered. Decision-making details documented in ED Course. Radiology: ordered.  Risk Prescription drug management.   ROMARION GALAN is a 77 y.o. male with comorbidities that complicate the patient evaluation including CAD sp CABG, HFpE, atrial fibrillation on eliquis, CKD, and diabetes who presents with weakness and tremor  Initial Ddx:  Myasthenia gravis flare, duloxetine side effect, intention tremor, deconditioning, seizure  MDM/Course:  Patient presents emergency department with generalized weakness and difficulty walking due to worsening tremor.  Did have his duloxetine increased recently.  No other infectious symptoms that I am able to find.  Does not have any focal neurologic deficits.  Had lab work today which included  a CBC and CMP that were unremarkable.  CT head without any acute findings.  Also had a chest x-ray without any acute findings.  Was discussed with neurology and feel the patient  symptoms likely are due to his duloxetine change so we will need to be restarted on 60 mg and follow-up with his outpatient doctor about this.  No signs of a myasthenia gravis flare at this time.  Upon re-evaluation the patient remained stable.  It appears that with his wife's significant difficulty taking care of him at home despite home health aid that he may benefit from placement.  Will have PT and OT see him.  Does have a urinalysis that is pending at this time.  This patient presents to the ED for concern of complaints listed in HPI, this involves an extensive number of treatment options, and is a complaint that carries with it a high risk of complications and morbidity. Disposition including potential need for admission considered.   Dispo: [facility]  Additional history obtained from spouse Records reviewed ED Visit Notes The following labs were independently interpreted: Chemistry and show CKD I independently reviewed the following imaging with scope of interpretation limited to determining acute life threatening conditions related to emergency care: CT Head and agree with the radiologist interpretation with the following exceptions: none I personally reviewed and interpreted cardiac monitoring: normal sinus rhythm  I personally reviewed and interpreted the pt's EKG: see above for interpretation  I have reviewed the patients home medications and made adjustments as needed Consults:  PT/OT Social Determinants of health:  Elderly         Final Clinical Impression(s) / ED Diagnoses Final diagnoses:  Tremor  Weakness generalized  History of myasthenia gravis    Rx / DC Orders ED Discharge Orders          Ordered    DULoxetine (CYMBALTA) 30 MG capsule  Every morning        05/29/23 1604               Rondel Baton, MD 05/29/23 1743

## 2023-05-29 NOTE — ED Notes (Signed)
Notified EDP of pt reporting he takes tylenol 3 for pain. Also notified EDP that pt is DM on insulin and will need home meds ordered. Waiting on pharmacy tech to reconcile pt medications for home meds to be ordered. Notified ED pharmacist of need for pharmacy tech to prioritize pt.

## 2023-05-29 NOTE — TOC Initial Note (Signed)
Transition of Care [facility] [facility]) - Initial/Assessment Note    Patient Details  Name: Fernando Hess MRN: [id number] Date of Birth: Nov 02, 1945  Transition of Care [facility]) CM/SW Contact:    Carmina Miller, LCSWA Phone Number: 05/29/2023, 6:28 PM  Clinical Narrative:                  CSW received consult for SNF, pt does not qualify for [facility] waiver, will need three night stay for STR, however upon chart review CSW can see where pt was sent to STR at [facility] place in July, per chart pt's spouse took him home (looks like last week or so) due to a covid outbreak. It does not appear that pt has had a 60 day wellness break so pt may be in copay status. Per RN notes-spouse unable to care for pt at home, pt may need LTC vs SNF. CSW attempted to contact pt's spouse to discuss, no answer, vm left, per RN spouse not at bedside. TOC will continue to follow for dispo.         Patient Goals and CMS Choice            Expected Discharge Plan and Services                                              Prior Living Arrangements/Services                       Activities of Daily Living      Permission Sought/Granted                  Emotional Assessment              Admission diagnosis:  Convulsions Patient Active Problem List   Diagnosis Date Noted   BPH (benign prostatic hyperplasia) 02/27/2023   History of anemia due to chronic kidney disease 02/27/2023   COVID-19 02/27/2023   COVID-19 virus infection 02/26/2023   Acute kidney injury superimposed on chronic kidney disease ([name]) 02/03/2023   Transient hypotension 02/03/2023   SIRS (systemic inflammatory response syndrome) ([name]) 02/03/2023   Dementia without behavioral disturbance ([name]) 02/03/2023   Fall at home, initial encounter 02/03/2023   Acute respiratory failure with hypoxia ([name]) 02/03/2023   Hypokalemia 02/03/2023   Prolonged QT interval 02/03/2023   Left ankle pain 01/08/2023   Hematuria 01/08/2023    Fracture of femoral neck, right, closed ([name]) 12/24/2022   Depression with anxiety 12/24/2022   Acute metabolic encephalopathy 09/18/2022   Acute encephalopathy 09/17/2022   AKI (acute kidney injury) ([name]) 09/04/2022   Generalized weakness 08/12/2022   Myasthenia gravis ([name]) 04/21/2022   CKD stage 3b, GFR 30-44 ml/min ([name]) 03/21/2022   Posterior vitreous detachment of both eyes 01/23/2022   Constipation 11/13/2021   DM2 (diabetes mellitus, type 2) ([name]) 11/13/2021   Severe major depression, single episode, without psychotic features ([name]) 11/13/2021   Amaurosis fugax 11/13/2021   Amnesia 11/13/2021   Anxiety 11/13/2021   Cardiac arrhythmia 11/13/2021   Hearing loss 11/13/2021   Hypercoagulable state ([name]) 11/13/2021   Hyperparathyroidism due to renal insufficiency ([name]) 11/13/2021   Mild aortic stenosis 11/05/2021   Paroxysmal atrial fibrillation ([name]) 11/05/2021   Nasal septal deviation 08/30/2021   Anticoagulated by anticoagulation treatment 07/16/2021   Syncope 05/10/2021   Gastro-esophageal reflux disease without esophagitis 07/25/2020  Globus pharyngeus 07/25/2020   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) 12/21/2019   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   Chronic diastolic CHF (congestive heart failure) ([name]) 11/18/2019   OSA on CPAP 03/07/2019   Peripheral artery disease    Intractable vascular headache 11/11/2018   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 [name]  [address] Phone: [phone] Fax: [phone]  [id number] - [place], [place] - [address] AT CORNER OF [facility] [address] [name] [address] Phone: [phone] Fax: [phone]  [facility] [address] [name] [address] Phone: [phone] Fax: [phone]     Social Determinants of Health (SDOH) Social History: SDOH Screenings   Food Insecurity: No Food Insecurity (02/27/2023)  Housing: Low Risk  (02/27/2023)  Transportation Needs: No Transportation Needs (02/27/2023)  Utilities: Not At Risk (02/27/2023)  Depression (PHQ2-9): Low Risk  (05/20/2019)  Social Connections: Unknown (01/15/2023)   Received from [facility], [facility]  Tobacco Use: Low Risk  (05/29/2023)   SDOH Interventions:     Readmission Risk Interventions     No data to display

## 2023-05-29 NOTE — ED Triage Notes (Addendum)
Pt present to ED from home with c/o falls and convulsions. Pt leg pain. Per EMS, pt has had a decrease dosage in med for MG and has been having convulsions since then. Pt A&Ox3 at this time.  EMS VS: 180/110 100 18 98% room air 223-CBG

## 2023-05-29 NOTE — ED Notes (Signed)
Pt attempted to provide urine sample and was unable. Will try again later.

## 2023-05-30 LAB — CBG MONITORING, ED
Glucose-Capillary: 209 mg/dL — ABNORMAL HIGH (ref 70–99)
Glucose-Capillary: 160 mg/dL — ABNORMAL HIGH (ref 70–99)
Glucose-Capillary: 226 mg/dL — ABNORMAL HIGH (ref 70–99)
Glucose-Capillary: 152 mg/dL — ABNORMAL HIGH (ref 70–99)

## 2023-05-30 NOTE — Progress Notes (Signed)
Name: Fernando Hess DOB: 12-21-1945  Please be advised that the above-named patient will require a short-term nursing home stay -- anticipated 30 days or less for rehabilitation and strengthening. The plan is for return home.   Edwin Dada, MSW, LCSW Transitions of Care  Clinical Social Worker II [phone]

## 2023-05-30 NOTE — [place] FL2 (Signed)
[place] MEDICAID FL2 LEVEL OF CARE FORM     IDENTIFICATION  Patient Name: Fernando Hess Birthdate: Oct 16, 1945 Sex: male Admission Date (Current Location): 05/29/2023  [facility] and [place] Number:  Producer, television/film/video and Address:  [facility] [place], [address] [place], [name] [address]      Provider Number: [id number]  Attending Physician Name and Address:  No att. providers found  Relative Name and Phone Number:       Current Level of Care: Hospital Recommended Level of Care: Skilled Nursing Facility Prior Approval Number:    Date Approved/Denied:   PASRR Number: Pending  Discharge Plan: SNF    Current Diagnoses: Patient Active Problem List   Diagnosis Date Noted   BPH (benign prostatic hyperplasia) 02/27/2023   History of anemia due to chronic kidney disease 02/27/2023   COVID-19 02/27/2023   COVID-19 virus infection 02/26/2023   Acute kidney injury superimposed on chronic kidney disease ([name]) 02/03/2023   Transient hypotension 02/03/2023   SIRS (systemic inflammatory response syndrome) ([name]) 02/03/2023   Dementia without behavioral disturbance ([name]) 02/03/2023   Fall at home, initial encounter 02/03/2023   Acute respiratory failure with hypoxia ([name]) 02/03/2023   Hypokalemia 02/03/2023   Prolonged QT interval 02/03/2023   Left ankle pain 01/08/2023   Hematuria 01/08/2023   Fracture of femoral neck, right, closed ([name]) 12/24/2022   Depression with anxiety 12/24/2022   Acute metabolic encephalopathy 09/18/2022   Acute encephalopathy 09/17/2022   AKI (acute kidney injury) ([name]) 09/04/2022   Generalized weakness 08/12/2022   Myasthenia gravis ([name]) 04/21/2022   CKD stage 3b, GFR 30-44 ml/min ([name]) 03/21/2022   Posterior vitreous detachment of both eyes 01/23/2022   Constipation 11/13/2021   DM2 (diabetes mellitus, type 2) ([name]) 11/13/2021   Severe major depression, single episode, without psychotic features ([name]) 11/13/2021    Amaurosis fugax 11/13/2021   Amnesia 11/13/2021   Anxiety 11/13/2021   Cardiac arrhythmia 11/13/2021   Hearing loss 11/13/2021   Hypercoagulable state ([name]) 11/13/2021   Hyperparathyroidism due to renal insufficiency ([name]) 11/13/2021   Mild aortic stenosis 11/05/2021   Paroxysmal atrial fibrillation ([name]) 11/05/2021   Nasal septal deviation 08/30/2021   Anticoagulated by anticoagulation treatment 07/16/2021   Syncope 05/10/2021   Gastro-esophageal reflux disease without esophagitis 07/25/2020   Globus pharyngeus 07/25/2020   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) 12/21/2019   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   Chronic diastolic CHF (congestive heart failure) ([name]) 11/18/2019   OSA on CPAP 03/07/2019   Peripheral artery disease    Intractable vascular headache 11/11/2018   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   Coronary artery disease involving native coronary artery of native heart without angina pectoris 09/24/2015   Moderate nonproliferative diabetic retinopathy of left eye with macular edema associated with type 2 diabetes mellitus ([name]) 03/01/2008   Hyperlipidemia    Essential hypertension    History of kidney stones     Orientation RESPIRATION BLADDER Height & Weight     Self, Place  Normal Incontinent Weight: 165 lb 5.5 oz (75 kg) Height:  5\' 6"  (167.6 cm)  BEHAVIORAL SYMPTOMS/MOOD NEUROLOGICAL BOWEL NUTRITION STATUS      Incontinent Diet (Regular)  AMBULATORY STATUS COMMUNICATION OF NEEDS Skin   Extensive Assist Verbally Normal                       Personal Care Assistance Level of Assistance  Bathing, Feeding, Dressing Bathing  Assistance: Maximum assistance Feeding assistance: Maximum assistance Dressing Assistance: Maximum assistance     Functional Limitations Info  Sight, Hearing, Speech Sight Info: Adequate Hearing Info: Adequate Speech Info: Adequate    SPECIAL CARE FACTORS FREQUENCY  PT (By licensed PT),  OT (By licensed OT)     PT Frequency: 5x weekly OT Frequency: 5x weekly            Contractures      Additional Factors Info  Allergies, Code Status Code Status Info: Full Code Allergies Info: Cilostazol Dulaglutide Levofloxacin Liraglutide Lisinopril           Current Medications (05/30/2023):  This is the current hospital active medication list Current Facility-Administered Medications  Medication Dose Route Frequency Provider Last Rate Last Admin   (feeding supplement) PROSource Plus liquid 30 mL  30 mL Oral BID AC & HS Plunkett, Whitney, MD   30 mL at 05/29/23 2132   acetaminophen (TYLENOL) tablet 1,000 mg  1,000 mg Oral Q6H PRN Gwyneth Sprout, MD       apixaban (ELIQUIS) tablet 5 mg  5 mg Oral BID Gwyneth Sprout, MD   5 mg at 05/30/23 0950   ascorbic acid (VITAMIN C) tablet 500 mg  500 mg Oral Daily Gwyneth Sprout, MD   500 mg at 05/30/23 0950   atorvastatin (LIPITOR) tablet 80 mg  80 mg Oral QHS Plunkett, Alphonzo Lemmings, MD   80 mg at 05/29/23 2132   budesonide (PULMICORT) nebulizer solution 0.25 mg  0.25 mg Nebulization Daily Gwyneth Sprout, MD   0.25 mg at 05/30/23 0957   cyanocobalamin (VITAMIN B12) injection 1,000 mcg  1,000 mcg Intramuscular Weekly Gwyneth Sprout, MD   1,000 mcg at 05/29/23 2135   DULoxetine (CYMBALTA) DR capsule 60 mg  60 mg Oral q morning Gwyneth Sprout, MD   60 mg at 05/30/23 0949   furosemide (LASIX) tablet 20 mg  20 mg Oral Daily Gwyneth Sprout, MD   20 mg at 05/30/23 0950   hydrALAZINE (APRESOLINE) tablet 25 mg  25 mg Oral Q6H PRN Gwyneth Sprout, MD       insulin aspart (novoLOG) injection 5 Units  5 Units Subcutaneous TID WC Gwyneth Sprout, MD   5 Units at 05/30/23 0947   insulin glargine-yfgn (SEMGLEE) injection 40 Units  40 Units Subcutaneous QHS Gwyneth Sprout, MD   40 Units at 05/29/23 2131   mirabegron ER (MYRBETRIQ) tablet 25 mg  25 mg Oral Daily Gwyneth Sprout, MD   25 mg at 05/30/23 0951   pantoprazole (PROTONIX)  EC tablet 40 mg  40 mg Oral q AM Gwyneth Sprout, MD   40 mg at 05/30/23 0950   QUEtiapine (SEROQUEL) tablet 50 mg  50 mg Oral Daily Gwyneth Sprout, MD   50 mg at 05/30/23 0950   senna-docusate (Senokot-S) tablet 1 tablet  1 tablet Oral BID PRN Gwyneth Sprout, MD       tamsulosin (FLOMAX) capsule 0.4 mg  0.4 mg Oral Daily Gwyneth Sprout, MD   0.4 mg at 05/30/23 0950   tiZANidine (ZANAFLEX) tablet 2 mg  2 mg Oral PRN Gwyneth Sprout, MD       Current Outpatient Medications  Medication Sig Dispense Refill   acetaminophen (TYLENOL) 500 MG tablet Take 1,000 mg by mouth every 6 (six) hours as needed for moderate pain.     Amino Acids-Protein Hydrolys (PRO-STAT) LIQD Take 30 mLs by mouth in the morning and at bedtime.     apixaban (ELIQUIS) 5 MG TABS tablet Take 1 tablet (  5 mg total) by mouth 2 (two) times daily. 60 tablet    ascorbic acid (VITAMIN C) 500 MG tablet Take 1 tablet (500 mg total) by mouth daily. 30 tablet 0   atorvastatin (LIPITOR) 80 MG tablet TAKE ONE TABLET BY MOUTH EVERY EVENING (Patient taking differently: Take 80 mg by mouth at bedtime.) 90 tablet 3   budesonide (PULMICORT) 0.25 MG/2ML nebulizer solution Take 0.25 mg by nebulization daily.     cyanocobalamin (VITAMIN B12) 1000 MCG/ML injection Inject 1,000 mcg into the muscle once a week. saturdays     furosemide (LASIX) 20 MG tablet Take 20 mg by mouth daily.     GVOKE HYPOPEN 2-PACK 1 MG/0.2ML SOAJ Inject into the skin.     hydrALAZINE (APRESOLINE) 25 MG tablet Take 1 tablet (25 mg total) by mouth every 6 (six) hours as needed (SBP>160 or DBP>110). 30 tablet 0   insulin lispro (HUMALOG) 100 UNIT/ML KwikPen Inject 5 Units into the skin 3 (three) times daily.     LANTUS SOLOSTAR 100 UNIT/ML Solostar Pen Inject 40 Units into the skin daily.     MYRBETRIQ 25 MG TB24 tablet Take 25 mg by mouth daily.     nitroGLYCERIN (NITROSTAT) 0.4 MG SL tablet Place 0.4 mg under the tongue every 5 (five) minutes as needed for chest  pain.     nystatin (MYCOSTATIN/NYSTOP) powder Apply 1 Application topically 2 (two) times daily.     ondansetron (ZOFRAN-ODT) 4 MG disintegrating tablet Take 4 mg by mouth every 8 (eight) hours as needed for nausea or vomiting.     pantoprazole (PROTONIX) 40 MG tablet TAKE 1 TABLET BY MOUTH EVERY DAY (Patient taking differently: Take 40 mg by mouth in the morning.) 90 tablet 2   QUEtiapine (SEROQUEL) 50 MG tablet Take 50 mg by mouth daily.     senna-docusate (SENOKOT-S) 8.6-50 MG tablet Take 1 tablet by mouth 2 (two) times daily as needed for moderate constipation.     tamsulosin (FLOMAX) 0.4 MG CAPS capsule Take 1 capsule (0.4 mg total) by mouth daily after breakfast. (Patient taking differently: Take 0.4 mg by mouth daily.) 30 capsule    tiZANidine (ZANAFLEX) 2 MG tablet Take 2 mg by mouth as needed for muscle spasms.     DULoxetine (CYMBALTA) 30 MG capsule Take 2 capsules (60 mg total) by mouth every morning. 30 capsule 0   ketoconazole (NIZORAL) 2 % cream Apply 1 Application topically daily.       Discharge Medications: Please see discharge summary for a list of discharge medications.  Relevant Imaging Results:  Relevant Lab Results:   Additional Information SSN: [id number]  Inis Sizer, LCSW
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
## 2023-05-30 NOTE — ED Notes (Signed)
Patient wife came to visit her husband after lunch

## 2023-05-30 NOTE — ED Notes (Signed)
Wife called him to tell him good night and to make sure we changed him for sleep.

## 2023-05-30 NOTE — ED Provider Notes (Signed)
Emergency Medicine Observation Re-evaluation Note  Fernando Hess is a 77 y.o. male, seen on rounds today.  Pt initially presented to the ED for complaints of Seizures Currently, the patient is awaiting SNF placement.  Physical Exam  BP (!) 151/85 (BP Location: Right Arm)   Pulse 91   Temp 98.2 F (36.8 C) (Oral)   Resp 18   Ht 5\' 6"  (1.676 m)   Wt 75 kg   SpO2 96%   BMI 26.69 kg/m  Physical Exam General: Alert no acute distress.  Mental status clear Cardiac: Regular rate controlled Lungs: Grossly clear to auscultation no respiratory distress at rest. Psych: Calm and alert Musculoskeletal: No significant peripheral edema.  Calves are soft and pliable.  Patient report is chronic pain behind the left knee.  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  ED Course / MDM  EKG:   I have reviewed the labs performed to date as well as medications administered while in observation.  Recent changes in the last 24 hours include placement by social work.  Plan  Current plan is for SNF placement.   I have extensively reviewed the patient's EMR.  Patient has had extensive diagnostic evaluation for gait instability and frequent falls.  Patient had neurology consultation yesterday.  Nursing advises that the patient's wife called with concerns that he has not had adequate diagnostic evaluation for failure to thrive and weakness with walking with falls.  Patient has had extensive diagnostic imaging that included MRIs of the brain and the spine with multiple CT scans.  At this time, I do not identify any acute changes that require further inpatient or ED based diagnostic evaluation.  Patient is anticoagulated and had CT scan done yesterday that did not show intracranial bleed.  Neurology made adjustments to his Cymbalta which I thought might be an issue with some worsening tremor and instability.  At this time we will continue to follow with  that plan as per yesterday.    Arby Barrette, MD 05/30/23 1020

## 2023-05-30 NOTE — ED Notes (Signed)
Pt called me into the room to tell me that he wanted to go to the bathroom. He did not want a bed pan but he wanted to get up and go using a wheelchair.  I went and got the wheel chair and he said never mind he will just go in the bed. Offered the bed pan again and he declined. He then started talking about how his wife lied about coming back and picking him up.  I advised him that it is 11pm and his wife had called to tell him good night and nothing will happen in the middle of the night. Pt laid back and told me that was all he needed.

## 2023-05-30 NOTE — ED Notes (Signed)
Patient was given a bath at 1845, linen was change

## 2023-05-30 NOTE — ED Notes (Signed)
Patient wife call nurse at 1645 to let her husband aware she took his cell phone home to charge it

## 2023-05-30 NOTE — ED Notes (Signed)
Went back in to check on patient. Said that he did not need the bathroom now or the bed pan. Has no request or complaints at this time.

## 2023-05-30 NOTE — ED Notes (Signed)
Pt care taken, is eating his supper, no complaints at this time.

## 2023-05-30 NOTE — Progress Notes (Signed)
CSW spoke with patient's wife Carney Bern who states she took the patient out of [facility] due to the facility having a COVID outbreak. Carney Bern states she had hired private duty caregivers at home to assist with providing the patient with his care. Carney Bern requesting CSW initiate SNF placement for patient and states she can private pay if needed. Carney Bern states she will only accept bed offers from 3, 4, or 5 star facilities. Carney Bern states understanding that facilities may not have availability and if one does not meet her standards patient would have to return home with the private caregivers.  CSW will complete FL2 and fax patient's clinical information out for review to obtain bed offers.  Edwin Dada, MSW, LCSW Transitions of Care  Clinical Social Worker II [phone]

## 2023-05-31 LAB — CBG MONITORING, ED
Glucose-Capillary: 112 mg/dL — ABNORMAL HIGH (ref 70–99)
Glucose-Capillary: 232 mg/dL — ABNORMAL HIGH (ref 70–99)

## 2023-05-31 NOTE — ED Notes (Signed)
Pt is confused, wanting [place] to get out of his house and then wanting me to help him to the living room. Reoriented the patient to where he is and now resting comfortably.

## 2023-05-31 NOTE — ED Provider Notes (Signed)
Emergency Medicine Observation Re-evaluation Note  Fernando Hess is a 77 y.o. male, seen on rounds today.  Pt initially presented to the ED for complaints of Seizures Currently, the patient is sitting in bed.  Physical Exam  BP (!) 170/97 (BP Location: Right Arm)   Pulse 89   Temp 97.8 F (36.6 C) (Oral)   Resp 17   Ht 5\' 6"  (1.676 m)   Wt 75 kg   SpO2 95%   BMI 26.69 kg/m  Physical Exam General: Awake, alert, nondistressed Cardiac: Extremities well-perfused Lungs: Breathing is unlabored Psych: No agitation  ED Course / MDM  EKG:   I have reviewed the labs performed to date as well as medications administered while in observation.  Recent changes in the last 24 hours include none.  Plan  Current plan is for placement.    Gloris Manchester, MD 05/31/23 1126

## 2023-06-01 LAB — CBG MONITORING, ED
Glucose-Capillary: 161 mg/dL — ABNORMAL HIGH (ref 70–99)
Glucose-Capillary: 188 mg/dL — ABNORMAL HIGH (ref 70–99)
Glucose-Capillary: 127 mg/dL — ABNORMAL HIGH (ref 70–99)
Glucose-Capillary: 104 mg/dL — ABNORMAL HIGH (ref 70–99)

## 2023-06-01 MED ORDER — LORAZEPAM 1 MG PO TABS
1.0000 mg | ORAL_TABLET | Freq: Once | ORAL | Status: AC
  Administered 2023-06-01: 1 mg via ORAL
  Filled 2023-06-01: qty 1

## 2023-06-01 NOTE — ED Provider Notes (Signed)
Emergency Medicine Observation Re-evaluation Note  Fernando Hess is a 77 y.o. male, seen on rounds today.  Pt initially presented to the ED for complaints of Seizures Currently, the patient is sitting in bed, receiving a breathing treatment  Physical Exam  BP 115/74 (BP Location: Right Arm)   Pulse 82   Temp 98.2 F (36.8 C) (Oral)   Resp 17   Ht 5\' 6"  (1.676 m)   Wt 75 kg   SpO2 96%   BMI 26.69 kg/m  Physical Exam General: NAD Cardiac: Extremities well-perfused Lungs: Breathing is unlabored Psych: No agitation  ED Course / MDM  EKG:   I have reviewed the labs performed to date as well as medications administered while in observation.  Recent changes in the last 24 hours include none.  Plan  Current plan is for placement.        Ernie Avena, MD 06/01/23 [id number]

## 2023-06-01 NOTE — ED Notes (Signed)
Pt attempted to get out of bed. Explained to pt that he has to stay in bed for his safety because he will fall. Pt stated, "Just watch me!" Notified EDP of pt behavior and need for prn for agitation.

## 2023-06-01 NOTE — Progress Notes (Signed)
Patients insurance authorization was approved. CSW will speak with [facility] admissions tomorrow to determine when patient can admit to their facility.

## 2023-06-01 NOTE — Progress Notes (Signed)
OT Cancellation Note  Patient Details Name: Fernando Hess MRN: [id number] DOB: 05-06-1946   Cancelled Treatment:    Reason Eval/Treat Not Completed: Other (comment) (Imminent orders recieved/acknowledged, pt evaluated 9/14 with recommendation for postacute rehab <3 hours/day. Per discussion with PT/SW pt does not need OT re evaluation at this time. Will follow up per plan of care as schedule permits.)  Carver Fila, OTD, OTR/L [facility] [phone]   Dalphine Handing 06/01/2023, 2:58 PM
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
## 2023-06-01 NOTE — ED Notes (Signed)
Pt received for care at 1900.  At this time, pt lying in bed with respirations even and unlabored.  Pt acknowledges this RN when spoken to but appears confused.  This RN introduced self to pt and pt responded, "Nice house."  Bed in lowest position, wheels locked.  Falls precautions in place.

## 2023-06-01 NOTE — Discharge Planning (Signed)
Licensed Clinical Social Worker is seeking post-discharge placement for this patient at the following level of care: skilled nursing facility    

## 2023-06-01 NOTE — [place] FL2 (Signed)
[place] MEDICAID FL2 LEVEL OF CARE FORM     IDENTIFICATION  Patient Name: Fernando Hess Birthdate: [birth year] Sex: male Admission Date (Current Location): 05/29/2023  [facility] [place] and [place] Number:  Producer, television/film/video and Address:  [facility]. [facility], [address], [place], [name] [address]      Provider Number: [id number]  Attending Physician Name and Address:  Georgana Curio, MD   Relative Name and Phone Number:     Taivon, Toomes (Spouse) [phone]   Current Level of Care: Hospital Recommended Level of Care: Skilled Nursing Facility Prior Approval Number:    Date Approved/Denied:   PASRR Number: Pending  Discharge Plan: SNF    Current Diagnoses: Patient Active Problem List   Diagnosis Date Noted   BPH (benign prostatic hyperplasia) 02/27/2023   History of anemia due to chronic kidney disease 02/27/2023   COVID-19 02/27/2023   COVID-19 virus infection 02/26/2023   Acute kidney injury superimposed on chronic kidney disease ([name]) 02/03/2023   Transient hypotension 02/03/2023   SIRS (systemic inflammatory response syndrome) ([name]) 02/03/2023   Dementia without behavioral disturbance ([name]) 02/03/2023   Fall at home, initial encounter 02/03/2023   Acute respiratory failure with hypoxia ([name]) 02/03/2023   Hypokalemia 02/03/2023   Prolonged QT interval 02/03/2023   Left ankle pain 01/08/2023   Hematuria 01/08/2023   Fracture of femoral neck, right, closed ([name]) 12/24/2022   Depression with anxiety 12/24/2022   Acute metabolic encephalopathy 09/18/2022   Acute encephalopathy 09/17/2022   AKI (acute kidney injury) ([name]) 09/04/2022   Generalized weakness 08/12/2022   Myasthenia gravis ([name]) 04/21/2022   CKD stage 3b, GFR 30-44 ml/min ([name]) 03/21/2022   Posterior vitreous detachment of both eyes 01/23/2022   Constipation 11/13/2021   DM2 (diabetes mellitus, type 2) ([name]) 11/13/2021   Severe major depression, single episode, without psychotic  features ([name]) 11/13/2021   Amaurosis fugax 11/13/2021   Amnesia 11/13/2021   Anxiety 11/13/2021   Cardiac arrhythmia 11/13/2021   Hearing loss 11/13/2021   Hypercoagulable state ([name]) 11/13/2021   Hyperparathyroidism due to renal insufficiency ([name]) 11/13/2021   Mild aortic stenosis 11/05/2021   Paroxysmal atrial fibrillation ([name]) 11/05/2021   Nasal septal deviation 08/30/2021   Anticoagulated by anticoagulation treatment 07/16/2021   Syncope 05/10/2021   Gastro-esophageal reflux disease without esophagitis 07/25/2020   Globus pharyngeus 07/25/2020   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) 12/21/2019   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   Chronic diastolic CHF (congestive heart failure) ([name]) 11/18/2019   OSA on CPAP 03/07/2019   Peripheral artery disease    Intractable vascular headache 11/11/2018   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   Coronary artery disease involving native coronary artery of native heart without angina pectoris 09/24/2015   Moderate nonproliferative diabetic retinopathy of left eye with macular edema associated with type 2 diabetes mellitus ([name]) 03/01/2008   Hyperlipidemia    Essential hypertension    History of kidney stones     Orientation RESPIRATION BLADDER Height & Weight     Self, Place  Normal Incontinent Weight: 165 lb 5.5 oz (75 kg) Height:  5\' 6"  (167.6 cm)  BEHAVIORAL SYMPTOMS/MOOD NEUROLOGICAL BOWEL NUTRITION STATUS      Incontinent Diet (Regular)  AMBULATORY STATUS COMMUNICATION OF NEEDS Skin   Extensive Assist Verbally Normal                       Personal Care Assistance Level of Assistance  Bathing, Feeding,  Dressing Bathing Assistance: Maximum assistance Feeding assistance: Maximum assistance Dressing Assistance: Maximum assistance     Functional Limitations Info  Sight, Hearing, Speech Sight Info: Adequate Hearing Info: Adequate Speech Info: Adequate    SPECIAL CARE FACTORS  FREQUENCY  PT (By licensed PT), OT (By licensed OT)     PT Frequency: 5x weekly OT Frequency: 5x weekly            Contractures      Additional Factors Info  Allergies, Code Status Code Status Info: Full Code Allergies Info: Cilostazol Dulaglutide Levofloxacin Liraglutide Lisinopril           Current Medications (06/01/2023):  This is the current hospital active medication list Current Facility-Administered Medications  Medication Dose Route Frequency Provider Last Rate Last Admin   (feeding supplement) PROSource Plus liquid 30 mL  30 mL Oral BID AC & HS Anitra Lauth, Whitney, MD   30 mL at 05/31/23 2137   acetaminophen (TYLENOL) tablet 1,000 mg  1,000 mg Oral Q6H PRN Gwyneth Sprout, MD   1,000 mg at 05/30/23 2152   apixaban (ELIQUIS) tablet 5 mg  5 mg Oral BID Gwyneth Sprout, MD   5 mg at 06/01/23 1308   ascorbic acid (VITAMIN C) tablet 500 mg  500 mg Oral Daily Gwyneth Sprout, MD   500 mg at 06/01/23 0943   atorvastatin (LIPITOR) tablet 80 mg  80 mg Oral QHS Plunkett, Alphonzo Lemmings, MD   80 mg at 05/31/23 2137   budesonide (PULMICORT) nebulizer solution 0.25 mg  0.25 mg Nebulization Daily Gwyneth Sprout, MD   0.25 mg at 06/01/23 0944   cyanocobalamin (VITAMIN B12) injection 1,000 mcg  1,000 mcg Intramuscular Weekly Gwyneth Sprout, MD   1,000 mcg at 05/29/23 2135   DULoxetine (CYMBALTA) DR capsule 60 mg  60 mg Oral q morning Gwyneth Sprout, MD   60 mg at 06/01/23 0943   furosemide (LASIX) tablet 20 mg  20 mg Oral Daily Gwyneth Sprout, MD   20 mg at 06/01/23 0943   hydrALAZINE (APRESOLINE) tablet 25 mg  25 mg Oral Q6H PRN Gwyneth Sprout, MD       insulin aspart (novoLOG) injection 5 Units  5 Units Subcutaneous TID WC Gwyneth Sprout, MD   5 Units at 06/01/23 0827   insulin glargine-yfgn (SEMGLEE) injection 40 Units  40 Units Subcutaneous QHS Gwyneth Sprout, MD   40 Units at 05/31/23 2327   mirabegron ER (MYRBETRIQ) tablet 25 mg  25 mg Oral Daily Gwyneth Sprout, MD   25 mg at 06/01/23 0943   pantoprazole (PROTONIX) EC tablet 40 mg  40 mg Oral q AM Gwyneth Sprout, MD   40 mg at 06/01/23 0611   QUEtiapine (SEROQUEL) tablet 50 mg  50 mg Oral Daily Gwyneth Sprout, MD   50 mg at 06/01/23 0943   senna-docusate (Senokot-S) tablet 1 tablet  1 tablet Oral BID PRN Gwyneth Sprout, MD       tamsulosin (FLOMAX) capsule 0.4 mg  0.4 mg Oral Daily Gwyneth Sprout, MD   0.4 mg at 06/01/23 0943   tiZANidine (ZANAFLEX) tablet 2 mg  2 mg Oral PRN Gwyneth Sprout, MD   2 mg at 05/30/23 2148   Current Outpatient Medications  Medication Sig Dispense Refill   acetaminophen (TYLENOL) 500 MG tablet Take 1,000 mg by mouth every 6 (six) hours as needed for moderate pain.     Amino Acids-Protein Hydrolys (PRO-STAT) LIQD Take 30 mLs by mouth in the morning and at bedtime.     apixaban (  ELIQUIS) 5 MG TABS tablet Take 1 tablet (5 mg total) by mouth 2 (two) times daily. 60 tablet    ascorbic acid (VITAMIN C) 500 MG tablet Take 1 tablet (500 mg total) by mouth daily. 30 tablet 0   atorvastatin (LIPITOR) 80 MG tablet TAKE ONE TABLET BY MOUTH EVERY EVENING (Patient taking differently: Take 80 mg by mouth at bedtime.) 90 tablet 3   budesonide (PULMICORT) 0.25 MG/2ML nebulizer solution Take 0.25 mg by nebulization daily.     cyanocobalamin (VITAMIN B12) 1000 MCG/ML injection Inject 1,000 mcg into the muscle once a week. saturdays     furosemide (LASIX) 20 MG tablet Take 20 mg by mouth daily.     GVOKE HYPOPEN 2-PACK 1 MG/0.2ML SOAJ Inject into the skin.     hydrALAZINE (APRESOLINE) 25 MG tablet Take 1 tablet (25 mg total) by mouth every 6 (six) hours as needed (SBP>160 or DBP>110). 30 tablet 0   insulin lispro (HUMALOG) 100 UNIT/ML KwikPen Inject 5 Units into the skin 3 (three) times daily.     LANTUS SOLOSTAR 100 UNIT/ML Solostar Pen Inject 40 Units into the skin daily.     MYRBETRIQ 25 MG TB24 tablet Take 25 mg by mouth daily.     nitroGLYCERIN (NITROSTAT) 0.4 MG SL  tablet Place 0.4 mg under the tongue every 5 (five) minutes as needed for chest pain.     nystatin (MYCOSTATIN/NYSTOP) powder Apply 1 Application topically 2 (two) times daily.     ondansetron (ZOFRAN-ODT) 4 MG disintegrating tablet Take 4 mg by mouth every 8 (eight) hours as needed for nausea or vomiting.     pantoprazole (PROTONIX) 40 MG tablet TAKE 1 TABLET BY MOUTH EVERY DAY (Patient taking differently: Take 40 mg by mouth in the morning.) 90 tablet 2   QUEtiapine (SEROQUEL) 50 MG tablet Take 50 mg by mouth daily.     senna-docusate (SENOKOT-S) 8.6-50 MG tablet Take 1 tablet by mouth 2 (two) times daily as needed for moderate constipation.     tamsulosin (FLOMAX) 0.4 MG CAPS capsule Take 1 capsule (0.4 mg total) by mouth daily after breakfast. (Patient taking differently: Take 0.4 mg by mouth daily.) 30 capsule    tiZANidine (ZANAFLEX) 2 MG tablet Take 2 mg by mouth as needed for muscle spasms.     DULoxetine (CYMBALTA) 30 MG capsule Take 2 capsules (60 mg total) by mouth every morning. 30 capsule 0   ketoconazole (NIZORAL) 2 % cream Apply 1 Application topically daily.       Discharge Medications: Please see AVS for a list of discharge medications.  Relevant Imaging Results:  Relevant Lab Results:   Additional Information SSN: [id number]  Susa Simmonds, LCSWA

## 2023-06-01 NOTE — Progress Notes (Signed)
Insurance authorization has been started in the [place] portal [id number])

## 2023-06-01 NOTE — Progress Notes (Signed)
PT Cancellation Note  Patient Details Name: MIGEL CLAFFEY MRN: [id number] DOB: 06-21-1946   Cancelled Treatment:    Reason Eval/Treat Not Completed: Patient declined, no reason specified. PT attempted to see pt for treatment this afternoon however pt declined mobilizing multiple times. PT will attempt to follow up as time allows.   Arlyss Gandy 06/01/2023, 1:16 PM

## 2023-06-01 NOTE — Progress Notes (Signed)
Patients PASRR approval [id number] E (06/01/23-07/01/23)

## 2023-06-01 NOTE — Progress Notes (Signed)
Physical Therapy Treatment Patient Details Name: Fernando Hess MRN: [id number] DOB: Feb 04, 1946 Today's Date: 06/01/2023   History of Present Illness 77 year old male  presenting to the emergency department after a fall. Patient states that yesterday he had a fall due to his weakness from his myasthenia gravis. imaging of head/spine and femur negative. 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    PT Comments  Pt tolerates treatment well although still requiring significant assistance for functional mobility. With standing pt's extremities begin to tremor, resulting in a strong posterior lean due to anterior translation of feet. Pt attempts standing with support of RW but is unable to successfully do so, requiring PT physical assist to facilitate anterior weight shift. Pt remains at a high risk for falls at this time due to weakness and impaired motor control. PT recommends short term inpatient PT services at the time of discharge.    If plan is discharge home, recommend the following: Two people to help with walking and/or transfers;Two people to help with bathing/dressing/bathroom;Help with stairs or ramp for entrance;Assist for transportation;Assistance with cooking/housework   Can travel by private vehicle     No  Equipment Recommendations  Hospital bed;Hoyer lift    Recommendations for Other Services       Precautions / Restrictions Precautions Precautions: Fall Precaution Comments: intention tremors in all extremities Restrictions Weight Bearing Restrictions: No     Mobility  Bed Mobility Overal bed mobility: Needs Assistance Bed Mobility: Supine to Sit, Sit to Supine     Supine to sit: Contact guard, Used rails, HOB elevated Sit to supine: Min assist        Transfers Overall transfer level: Needs assistance Equipment used: 1 person hand held assist Transfers: Sit  to/from Stand Sit to Stand: Max assist           General transfer comment: PT provides BUE support and knee block in face to face transfer, with initiation of standing pt begins to have tremors in all extremities which results in posterior lean and anterior displacement of feet. Pt stands twice during session    Ambulation/Gait                   Stairs             Wheelchair Mobility     Tilt Bed    Modified Rankin (Stroke Patients Only)       Balance Overall balance assessment: Needs assistance Sitting-balance support: No upper extremity supported, Feet supported Sitting balance-Leahy Scale: Fair     Standing balance support: Bilateral upper extremity supported, Reliant on assistive device for balance Standing balance-Leahy Scale: Poor Standing balance comment: maxA, posterior lean ~7 seconds                            Cognition Arousal: Alert Behavior During Therapy: WFL for tasks assessed/performed Overall Cognitive Status: Impaired/Different from baseline Area of Impairment: Orientation, Attention, Memory, Following commands, Safety/judgement, Awareness, Problem solving                 Orientation Level: Disoriented to, Time Current Attention Level: Sustained Memory: Decreased recall of precautions, Decreased short-term memory Following Commands: Follows one step commands consistently, Follows multi-step commands inconsistently Safety/Judgement: Decreased awareness of safety, Decreased awareness of deficits Awareness: Emergent Problem Solving: Slow processing, Requires verbal cues  Exercises      General Comments General comments (skin integrity, edema, etc.): VSS on RA      Pertinent Vitals/Pain Pain Assessment Pain Assessment: No/denies pain    Home Living                          Prior Function            PT Goals (current goals can now be found in the care plan section) Acute Rehab PT  Goals Patient Stated Goal: get to rehab and eventually ALF Progress towards PT goals: Progressing toward goals    Frequency    Min 1X/week      PT Plan Current plan remains appropriate    Co-evaluation              AM-PAC PT "6 Clicks" Mobility   Outcome Measure  Help needed turning from your back to your side while in a flat bed without using bedrails?: A Little Help needed moving from lying on your back to sitting on the side of a flat bed without using bedrails?: A Little Help needed moving to and from a bed to a chair (including a wheelchair)?: Total Help needed standing up from a chair using your arms (e.g., wheelchair or bedside chair)?: A Lot Help needed to walk in hospital room?: Total Help needed climbing 3-5 steps with a railing? : Total 6 Click Score: 11    End of Session Equipment Utilized During Treatment: Gait belt Activity Tolerance: Patient tolerated treatment well Patient left: in bed;with call bell/phone within reach;with bed alarm set Nurse Communication: Mobility status;Need for lift equipment PT Visit Diagnosis: Other abnormalities of gait and mobility (R26.89);Other symptoms and signs involving the nervous system (R29.898);Muscle weakness (generalized) (M62.81);History of falling (Z91.81)     Time: 1308-6578 PT Time Calculation (min) (ACUTE ONLY): 13 min  Charges:    $Therapeutic Activity: 8-22 mins PT General Charges $$ ACUTE PT VISIT: 1 Visit                     Arlyss Gandy, PT, DPT Acute Rehabilitation Office [phone]    Arlyss Gandy 06/01/2023, 2:48 PM

## 2023-06-01 NOTE — Progress Notes (Signed)
CSW spoke with patients wife who accepted the bed offer at [facility]. CSW spoke with [facility] in admissions to confirm bed offer. [facility] is not able to extend an offer for rehab. CSW contacted patients wife back who decided to go with [facility] as her second choice. CSW spoke with [place] in admissions at [facility] who confirmed the bed offer. CSW will start the insurance authorization once new PT notes are received.

## 2023-06-01 NOTE — ED Notes (Signed)
Pt requesting ice water while this RN updating vitals.  This RN will provide same and attempt to administer ordered nightly medications.  Pt continues to have intermittent confusion.

## 2023-06-02 LAB — CBG MONITORING, ED
Glucose-Capillary: 146 mg/dL — ABNORMAL HIGH (ref 70–99)
Glucose-Capillary: 198 mg/dL — ABNORMAL HIGH (ref 70–99)

## 2023-06-02 NOTE — ED Notes (Signed)
Pt continues to talk to self in room.  Pt not currently trying to get out of bed.  Restraints remain off at this time.

## 2023-06-02 NOTE — Discharge Instructions (Signed)
Return for any problem.  ?

## 2023-06-02 NOTE — ED Provider Notes (Signed)
Emergency Medicine Observation Re-evaluation Note  Fernando Hess is a 77 y.o. male, seen on rounds today.  Pt initially presented to the ED for complaints of Seizures Currently, the patient is resting.  Physical Exam  BP (!) 144/106 (BP Location: Right Arm)   Pulse 93   Temp 98.8 F (37.1 C) (Oral)   Resp 18   Ht 5\' 6"  (1.676 m)   Wt 75 kg   SpO2 100%   BMI 26.69 kg/m  Physical Exam General: NAD   ED Course / MDM  EKG:   I have reviewed the labs performed to date as well as medications administered while in observation.  Recent changes in the last 24 hours include no acute events reported.  Plan  Current plan is for placement.    Wynetta Fines, MD 06/02/23 [phone]

## 2023-06-02 NOTE — Progress Notes (Signed)
Occupational Therapy Treatment Patient Details Name: Fernando Hess MRN: [id number] DOB: 08/28/1946 Today's Date: 06/02/2023   History of present illness 77 year old male  presenting to the emergency department after a fall. Patient states that yesterday he had a fall due to his weakness from his myasthenia gravis. imaging of head/spine and femur negative. 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   OT comments  Pt completed simulated toilet transfers stand pivot and short distance mobility with mod hand held assist.  Slight agitation noted to start secondary to pt wanting his spouse to be here and that he wants to go home, but re-directed back to situation and able to participate with increased conversation and persuasion from therapist.   Mod assist for donning and doffing his shoes in preparation for standing and mobility. Spouse cannot provide safe assist at current level and feel he will benefit from continued acute care OT at this time to help. Also, recommend that patient will benefit from continued inpatient follow up therapy, <3 hours/day post acute stay.         If plan is discharge home, recommend the following:  Two people to help with walking and/or transfers;A lot of help with bathing/dressing/bathroom;Two people to help with bathing/dressing/bathroom;Assistance with cooking/housework;Assistance with feeding;Direct supervision/assist for medications management;Assist for transportation;Direct supervision/assist for financial management;Help with stairs or ramp for entrance   Equipment Recommendations  None recommended by OT       Precautions / Restrictions Precautions Precautions: Fall Precaution Comments: intention tremors in all extremities Restrictions Weight Bearing Restrictions: No       Mobility Bed Mobility Overal bed mobility: Needs Assistance Bed Mobility: Supine  to Sit, Sit to Supine     Supine to sit: Min assist, Used rails, HOB elevated, Mod assist Sit to supine: Contact guard assist   General bed mobility comments: Mod assist needed for initiation and sequencing for supine to sit to flex hips, roll to the side, and bring LEs off of the bed.  Min assist on second after laying down abruptly.    Transfers Overall transfer level: Needs assistance Equipment used: 1 person hand held assist Transfers: Sit to/from Stand, Bed to chair/wheelchair/BSC Sit to Stand: Min assist (from EOB)     Step pivot transfers: Mod assist     General transfer comment: Mod hand held assist on the right to ambulate to the door of his room and back X2.  Decreased efficiency with stepping and increased right lean.     Balance Overall balance assessment: Needs assistance Sitting-balance support: No upper extremity supported, Feet supported Sitting balance-Leahy Scale: Fair Sitting balance - Comments: Slight LOB when donning shoes   Standing balance support: During functional activity, Single extremity supported Standing balance-Leahy Scale: Poor Standing balance comment: Increased posterior and right LOB in standing during mobilization                           ADL either performed or assessed with clinical judgement   ADL Overall ADL's : Needs assistance/impaired                     Lower Body Dressing: Moderate assistance;Sit to/from stand Lower Body Dressing Details (indicate cue type and reason): donning and doffing shoes.  Assist needed for tying them but he was able to complete all other parts. Toilet Transfer: Moderate assistance;Stand-pivot;Ambulation Toilet Transfer Details (indicate  cue type and reason): simulated with mobility to the door of his room and back and to the bedside chair in his room Toileting- Clothing Manipulation and Hygiene: Moderate assistance;Sit to/from stand Toileting - Clothing Manipulation Details (indicate  cue type and reason): simulated     Functional mobility during ADLs: Moderate assistance (short distance mobility in the room) General ADL Comments: Pt needed min assist for sit to stand from the EOB with increased time needed secondary to confusion and slight agitation.  Mod assist for ambulating hand held assist on the right and with lateral support to the door and back to the EOB.  Increased right lean noted with mobility and decreased smoothness of gait pattern.      Cognition Arousal: Alert Behavior During Therapy: Agitated (slight agitation but improved throughout session) Overall Cognitive Status: Impaired/Different from baseline Area of Impairment: Orientation, Attention, Memory, Safety/judgement, Awareness, Problem solving                 Orientation Level: Disoriented to, Time, Situation Current Attention Level: Sustained Memory: Decreased short-term memory Following Commands: Follows one step commands with increased time, Follows multi-step commands with increased time Safety/Judgement: Decreased awareness of safety Awareness: Intellectual Problem Solving: Slow processing, Requires verbal cues General Comments: Pt upset that his spouse is not in the room.  He stated "they wouldn't let her come".  Verified with nursing that she usually comes later in the day.  Re-directed pt that she would possibly be back later today but that no one is keeping her from seeing him.                   Pertinent Vitals/ Pain       Pain Assessment Pain Assessment: Faces Pain Score: 0-No pain         Frequency  Min 1X/week        Progress Toward Goals  OT Goals(current goals can now be found in the care plan section)  Progress towards OT goals: Progressing toward goals  Acute Rehab OT Goals Patient Stated Goal: Pt wants to go home and wants his wife to visit him here at the hospital OT Goal Formulation: With patient Time For Goal Achievement: 06/13/23 Potential to  Achieve Goals: Fair  Plan         AM-PAC OT "6 Clicks" Daily Activity     Outcome Measure   Help from another person eating meals?: A Little Help from another person taking care of personal grooming?: A Little Help from another person toileting, which includes using toliet, bedpan, or urinal?: A Lot Help from another person bathing (including washing, rinsing, drying)?: A Lot Help from another person to put on and taking off regular upper body clothing?: A Little Help from another person to put on and taking off regular lower body clothing?: A Lot 6 Click Score: 15    End of Session Equipment Utilized During Treatment: Gait belt  OT Visit Diagnosis: Unsteadiness on feet (R26.81);Repeated falls (R29.6);Muscle weakness (generalized) (M62.81);History of falling (Z91.81);Other abnormalities of gait and mobility (R26.89);Other symptoms and signs involving cognitive function   Activity Tolerance Patient tolerated treatment well   Patient Left in bed;with call bell/phone within reach;with bed alarm set   Nurse Communication Mobility status        Time: 1191-4782 OT Time Calculation (min): 34 min  Charges: OT General Charges $OT Visit: 1 Visit OT Treatments $Self Care/Home Management : 23-37 mins  Perrin Maltese, OTR/L Acute Rehabilitation Services  Office [phone] 06/02/2023

## 2023-06-02 NOTE — ED Notes (Signed)
Pt asking about getting into a comfortable chair.  This RN explained that he did not have a comfortable chair for pt to get into but offered to sit pt up and assist with comfort in bed.  Pt boosted and sat up in bed.  Pillow placed under heels for further comfort.

## 2023-06-02 NOTE — ED Notes (Signed)
[facility] has been scheduled for the patient.  Per [facility], they should arrive shortly.

## 2023-06-02 NOTE — ED Notes (Addendum)
Pt asking this RN "what kind of house is this?"  This RN explained that this was a hospital.  Pt then conversed with this RN a little before asking, "How many rooms does this house have?"  Pt reminded again that he was in the hospital.  Pt continues to exhibit intermittent confusion but can also carry on a conversation somewhat, such as asking this RN about the car he drives.

## 2023-06-02 NOTE — ED Notes (Addendum)
Pt repositioned and boosted in bed by staff due to legs being over side rails.  Pt provided with drink per request.

## 2023-06-02 NOTE — ED Notes (Signed)
Pt's brief checked at this time; pt clean and dry.  Pt repositioned in bed.    Pt denies further needs at current time.

## 2023-06-02 NOTE — ED Notes (Signed)
Pt cleaned and changed following urinary occurrence.  New gown placed on pt.

## 2023-06-02 NOTE — Progress Notes (Signed)
Patients wife completed paperwork at [facility]. Patient will discharge today and go to room 308 and number for report is [phone]

## 2023-06-02 NOTE — ED Notes (Signed)
Pt has slid self back down in bed.  Pt not attempting to get up but seems to prefer lying flat.

## 2023-06-02 NOTE — Progress Notes (Signed)
CSW contacted Nelma Rothman in admissions who stated she can take patient today. CSW was told she would receive patients room information later on this morning. Patients wife will go to [place] re[facility] and complete paperwork.

## 2023-06-10 ENCOUNTER — Ambulatory Visit (INDEPENDENT_AMBULATORY_CARE_PROVIDER_SITE_OTHER): Payer: Medicare Other | Admitting: Podiatry

## 2023-06-10 ENCOUNTER — Encounter: Payer: Self-pay | Admitting: Podiatry

## 2023-06-10 DIAGNOSIS — M79675 Pain in left toe(s): Secondary | ICD-10-CM | POA: Diagnosis not present

## 2023-06-10 DIAGNOSIS — M79674 Pain in right toe(s): Secondary | ICD-10-CM

## 2023-06-10 DIAGNOSIS — B351 Tinea unguium: Secondary | ICD-10-CM

## 2023-06-10 DIAGNOSIS — E1151 Type 2 diabetes mellitus with diabetic peripheral angiopathy without gangrene: Secondary | ICD-10-CM | POA: Diagnosis not present

## 2023-06-10 NOTE — Progress Notes (Signed)
This patient returns to my office for at risk foot care.  This patient requires this care by a professional since this patient will be at risk due to having diabetes.  This patient is unable to cut nails himself since the patient cannot reach his nails.These nails are painful walking and wearing shoes. Patient presents to the office with his wife in a wheelchair. This patient presents for at risk foot care today.  Patient says he fell weeks agon and the nail on his left big toe is unattached and blood is present.  General Appearance  Alert, conversant and in no acute stress.  Vascular  Dorsalis pedis and posterior tibial  pulses are  weakly palpable  bilaterally.  Capillary return is within normal limits  bilaterally. Temperature is within normal limits  bilaterally.  Neurologic  Senn-Weinstein monofilament wire test within normal limits  bilaterally. Muscle power within normal limits bilaterally.  Nails Thick disfigured discolored nails with subungual debris  from hallux to fifth toes bilaterally. No evidence of bacterial infection or drainage bilaterally.  Orthopedic  No limitations of motion  feet .  No crepitus or effusions noted.  No bony pathology .  Hammer toe second toe left foot.  Skin  normotropic skin with no porokeratosis noted bilaterally.  No signs of infections or ulcers noted.  Healing skin lesion second toe which he relates from his fall.   Onychomycosis  Pain in right toes  Pain in left toes  Consent was obtained for treatment procedures.   Mechanical debridement of nails 1-5  bilaterally performed with a nail nipper.  Filed with dremel without incident.  Debride unattached nail left hallux.  No drainage or infection noted.  Dried blood noted left hallux.   Return office visit   3 months                   Told patient to return for periodic foot care and evaluation due to potential at risk complications.   Helane Gunther DPM

## 2023-06-11 ENCOUNTER — Ambulatory Visit: Payer: Medicare Other | Admitting: Internal Medicine

## 2023-07-15 ENCOUNTER — Emergency Department (HOSPITAL_COMMUNITY)
Admission: EM | Admit: 2023-07-15 | Discharge: 2023-07-16 | Disposition: A | Discharge status: Home / Self Care | Payer: Medicare Other | Attending: Emergency Medicine | Admitting: Emergency Medicine

## 2023-07-15 ENCOUNTER — Emergency Department (HOSPITAL_COMMUNITY): Payer: Medicare Other

## 2023-07-15 ENCOUNTER — Other Ambulatory Visit: Payer: Self-pay

## 2023-07-15 ENCOUNTER — Encounter (HOSPITAL_COMMUNITY): Payer: Self-pay

## 2023-07-15 DIAGNOSIS — Z7901 Long term (current) use of anticoagulants: Secondary | ICD-10-CM | POA: Insufficient documentation

## 2023-07-15 DIAGNOSIS — R251 Tremor, unspecified: Secondary | ICD-10-CM | POA: Diagnosis present

## 2023-07-15 DIAGNOSIS — Z794 Long term (current) use of insulin: Secondary | ICD-10-CM | POA: Insufficient documentation

## 2023-07-15 DIAGNOSIS — R4701 Aphasia: Secondary | ICD-10-CM | POA: Diagnosis not present

## 2023-07-15 DIAGNOSIS — R569 Unspecified convulsions: Secondary | ICD-10-CM

## 2023-07-15 LAB — URINALYSIS, ROUTINE W REFLEX MICROSCOPIC
Bacteria, UA: NONE SEEN
Bilirubin Urine: NEGATIVE
Glucose, UA: NEGATIVE mg/dL
Ketones, ur: NEGATIVE mg/dL
Leukocytes,Ua: NEGATIVE
Nitrite: NEGATIVE
Protein, ur: 100 mg/dL — AB
RBC / HPF: >50 RBC/hpf (ref 0–5)
Specific Gravity, Urine: 1.026 (ref 1.005–1.030)
pH: 5 (ref 5.0–8.0)

## 2023-07-15 LAB — COMPREHENSIVE METABOLIC PANEL
ALT: 18 U/L (ref 0–44)
AST: 30 U/L (ref 15–41)
Albumin: 3 g/dL — ABNORMAL LOW (ref 3.5–5.0)
Alkaline Phosphatase: 91 U/L (ref 38–126)
Anion gap: 9 (ref 5–15)
BUN: 25 mg/dL — ABNORMAL HIGH (ref 8–23)
CO2: 22 mmol/L (ref 22–32)
Calcium: 8.6 mg/dL — ABNORMAL LOW (ref 8.9–10.3)
Chloride: 110 mmol/L (ref 98–111)
Creatinine, Ser: 2.08 mg/dL — ABNORMAL HIGH (ref 0.61–1.24)
GFR, Estimated: 32 mL/min — ABNORMAL LOW (ref >60)
Glucose, Bld: 74 mg/dL (ref 70–99)
Potassium: 4.4 mmol/L (ref 3.5–5.1)
Sodium: 141 mmol/L (ref 135–145)
Total Bilirubin: 0.9 mg/dL (ref 0.3–1.2)
Total Protein: 5.6 g/dL — ABNORMAL LOW (ref 6.5–8.1)

## 2023-07-15 LAB — CBC WITH DIFFERENTIAL/PLATELET
Abs Immature Granulocytes: 0.09 10*3/uL — ABNORMAL HIGH (ref 0.00–0.07)
Basophils Absolute: 0.1 10*3/uL (ref 0.0–0.1)
Basophils Relative: 1 %
Eosinophils Absolute: 0.1 10*3/uL (ref 0.0–0.5)
Eosinophils Relative: 1 %
HCT: 41.4 % (ref 39.0–52.0)
Hemoglobin: 13.1 g/dL (ref 13.0–17.0)
Immature Granulocytes: 1 %
Lymphocytes Relative: 7 %
Lymphs Abs: 0.8 10*3/uL (ref 0.7–4.0)
MCH: 28.4 pg (ref 26.0–34.0)
MCHC: 31.6 g/dL (ref 30.0–36.0)
MCV: 89.6 fL (ref 80.0–100.0)
Monocytes Absolute: 0.9 10*3/uL (ref 0.1–1.0)
Monocytes Relative: 8 %
Neutro Abs: 9.2 10*3/uL — ABNORMAL HIGH (ref 1.7–7.7)
Neutrophils Relative %: 82 %
Platelets: 252 10*3/uL (ref 150–400)
RBC: 4.62 MIL/uL (ref 4.22–5.81)
RDW: 15.8 % — ABNORMAL HIGH (ref 11.5–15.5)
WBC: 11.1 10*3/uL — ABNORMAL HIGH (ref 4.0–10.5)
nRBC: 0 % (ref 0.0–0.2)

## 2023-07-15 LAB — MAGNESIUM: Magnesium: 1.9 mg/dL (ref 1.7–2.4)

## 2023-07-15 MED ORDER — SODIUM CHLORIDE 0.9 % IV BOLUS
1000.0000 mL | Freq: Once | INTRAVENOUS | Status: AC
  Administered 2023-07-15: 1000 mL via INTRAVENOUS

## 2023-07-15 NOTE — Progress Notes (Signed)
EEG reviewed. Diffuse intermittent slowing and muscle artifact are noted. No electrographic seizures are seen.   Electronically signed: Dr. Caryl Pina

## 2023-07-15 NOTE — ED Provider Notes (Signed)
  Provider Note MRN:  [id number]  Arrival date & time: 07/15/23    ED Course and Medical Decision Making  Assumed care from Dr. Adela Lank at shift change.  Chronic tremors and increased somnolence, sent here by family.  Evaluated thoroughly here in the emergency department as well as by Dr. Otelia Limes.  Spot EEG reassuring.  Awaiting urinalysis.  Anticipating discharge with continued PCP/neurology follow-up.  Procedures  Final Clinical Impressions(s) / ED Diagnoses     ICD-10-CM   1. Tremor  R25.1 Ambulatory referral to Neurology      ED Discharge Orders          Ordered    Ambulatory referral to Neurology       Comments: An appointment is requested in approximately: 1 week   07/15/23 2359              Discharge Instructions      You were evaluated in the Emergency Department and after careful evaluation, we did not find any emergent condition requiring admission or further testing in the hospital.  Your exam/testing today is overall reassuring.  Recommend follow-up with your primary care doctor and neurology as an outpatient.  Please return to the Emergency Department if you experience any worsening of your condition.   Thank you for allowing [place] to be a part of your care.      Elmer Sow. Pilar Plate, MD [facility] Emergency Medicine [facility] [email or web address]    Sabas Sous, MD 07/15/23 [phone]

## 2023-07-15 NOTE — Discharge Instructions (Signed)
You were evaluated in the Emergency Department and after careful evaluation, we did not find any emergent condition requiring admission or further testing in the hospital.  Your exam/testing today is overall reassuring.  Recommend follow-up with your primary care doctor and neurology as an outpatient.  Please return to the Emergency Department if you experience any worsening of your condition.   Thank you for allowing [place] to be a part of your care.

## 2023-07-15 NOTE — Procedures (Signed)
Patient Name: SOSA PACIFICO  MRN: [id number]  Epilepsy Attending: Charlsie Quest  Referring Physician/Provider: Melene Plan, DO  Date: 07/15/2023 Duration: 21.45 mins  Patient history: 77yo M with seizure like activity getting eeg to evaluate for seizure  Level of alertness: Awake  AEDs during EEG study: None  Technical aspects: This EEG study was done with scalp electrodes positioned according to the 10-20 International system of electrode placement. Electrical activity was reviewed with band pass filter of 1-70Hz , sensitivity of 7 uV/mm, display speed of 55mm/sec with a 60Hz  notched filter applied as appropriate. EEG data were recorded continuously and digitally stored.  Video monitoring was available and reviewed as appropriate.  Description: The posterior dominant rhythm consists of 7 Hz activity of moderate voltage (25-35 uV) seen predominantly in posterior head regions, symmetric and reactive to eye opening and eye closing. Hyperventilation and photic stimulation were not performed.     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 No seizures or epileptiform discharges were seen throughout the recording.  Maille Halliwell Annabelle Harman

## 2023-07-15 NOTE — ED Provider Notes (Signed)
Fernando Hess   CSN: [id number] Arrival date & time: 07/15/23  1818     History  Chief Complaint  Patient presents with   Tremors   Aphasia    Fernando Hess is a 77 y.o. male.  Fernando Hess with a chief complaints of diffuse tremors.  He is not sure exactly why he is here.  He thinks maybe the tremors got worse.  He does not think he saw his doctor today.  He does not think he had any medications had been changed.  He denies pain anywhere.  Level 5 caveat dementia.        Home Medications Prior to Admission medications   Medication Sig Start Date End Date Taking? Authorizing Provider  acetaminophen (TYLENOL) 500 MG tablet Take 1,000 mg by mouth every 6 (six) hours as needed for moderate pain.    [provider]  Amino Acids-Protein Hydrolys (PRO-STAT) LIQD Take 30 mLs by mouth in the morning and at bedtime.    [provider]  apixaban (ELIQUIS) 5 MG TABS tablet Take 1 tablet (5 mg total) by mouth 2 (two) times daily. 01/01/23   Lorin Glass, MD  ascorbic acid (VITAMIN C) 500 MG tablet Take 1 tablet (500 mg total) by mouth daily. 03/02/23   Regalado, Belkys A, MD  atorvastatin (LIPITOR) 80 MG tablet TAKE ONE TABLET BY MOUTH EVERY EVENING Patient taking differently: Take 80 mg by mouth at bedtime. 11/13/22   Tereso Newcomer T, [name]  budesonide (PULMICORT) 0.25 MG/2ML nebulizer solution Take 0.25 mg by nebulization daily. 02/13/23   [provider]  cyanocobalamin (VITAMIN B12) 1000 MCG/ML injection Inject 1,000 mcg into the muscle once a week. saturdays    [provider]  DULoxetine (CYMBALTA) 30 MG capsule Take 2 capsules (60 mg total) by mouth every morning. 05/29/23   Rondel Baton, MD  furosemide (LASIX) 20 MG tablet Take 20 mg by mouth daily.    [provider]  GVOKE HYPOPEN 2-PACK 1 MG/0.2ML SOAJ Inject into the skin. 04/03/23   [provider]  hydrALAZINE  (APRESOLINE) 25 MG tablet Take 1 tablet (25 mg total) by mouth every 6 (six) hours as needed (SBP>160 or DBP>110). 01/12/23   Rhetta Mura, MD  insulin lispro (HUMALOG) 100 UNIT/ML KwikPen Inject 5 Units into the skin 3 (three) times daily. 05/15/23   [provider]  ketoconazole (NIZORAL) 2 % cream Apply 1 Application topically daily.    [provider]  LANTUS SOLOSTAR 100 UNIT/ML Solostar Pen Inject 40 Units into the skin daily. 02/19/23   [provider]  MYRBETRIQ 25 MG TB24 tablet Take 25 mg by mouth daily. 03/23/23   [provider]  nitroGLYCERIN (NITROSTAT) 0.4 MG SL tablet Place 0.4 mg under the tongue every 5 (five) minutes as needed for chest pain.    [provider]  nystatin (MYCOSTATIN/NYSTOP) powder Apply 1 Application topically 2 (two) times daily. 05/15/23   [provider]  ondansetron (ZOFRAN-ODT) 4 MG disintegrating tablet Take 4 mg by mouth every 8 (eight) hours as needed for nausea or vomiting. 04/08/23   [provider]  pantoprazole (PROTONIX) 40 MG tablet TAKE 1 TABLET BY MOUTH EVERY DAY Patient taking differently: Take 40 mg by mouth in the morning. 11/25/19   [name], MD  QUEtiapine (SEROQUEL) 50 MG tablet Take 50 mg by mouth daily. 02/23/23   [provider]  senna-docusate (SENOKOT-S) 8.6-50 MG tablet  Take 1 tablet by mouth 2 (two) times daily as needed for moderate constipation. 01/09/23   Almon Hercules, MD  tamsulosin (FLOMAX) 0.4 MG CAPS capsule Take 1 capsule (0.4 mg total) by mouth daily after breakfast. Patient taking differently: Take 0.4 mg by mouth daily. 01/02/23   Lorin Glass, MD  tiZANidine (ZANAFLEX) 2 MG tablet Take 2 mg by mouth as needed for muscle spasms.    [provider]      Allergies    Cilostazol, Dulaglutide, Levofloxacin, Liraglutide, and Lisinopril    Review of Systems   Review of Systems  Physical Exam Updated Vital Signs BP (!) 153/134 (BP Location:  Left Arm)   Pulse 88   Temp 97.9 F (36.6 C) (Oral)   Resp 18   Ht 5\' 6"  (1.676 Hess)   Wt 79.4 kg   SpO2 95%   BMI 28.25 kg/Hess  Physical Exam Vitals and nursing Hess reviewed.  Constitutional:      Appearance: He is well-developed.  HENT:     Head: Normocephalic and atraumatic.  Eyes:     Pupils: Pupils are equal, round, and reactive to light.  Neck:     Vascular: No JVD.  Cardiovascular:     Rate and Rhythm: Normal rate and regular rhythm.     Heart sounds: No murmur heard.    No friction rub. No gallop.  Pulmonary:     Effort: No respiratory distress.     Breath sounds: No wheezing.  Abdominal:     General: There is no distension.     Tenderness: There is no abdominal tenderness. There is no guarding or rebound.  Musculoskeletal:        General: Normal range of motion.     Cervical back: Normal range of motion and neck supple.  Skin:    Coloration: Skin is not pale.     Findings: No rash.  Neurological:     Mental Status: He is alert and oriented to person, place, and time.     Comments: No obvious neurological deficit.  Patient does have some tremors at times but has no obvious cogwheeling.  I am able to fully range the shoulders and the hips and the knees without any significant tremor.  He has no appreciable weakness.  Psychiatric:        Behavior: Behavior normal.     ED Results / Procedures / Treatments   Labs (all labs ordered are listed, but only abnormal results are displayed) Labs Reviewed  CBC WITH DIFFERENTIAL/PLATELET - Abnormal; Notable for the following components:      Result Value   WBC 11.1 (*)    RDW 15.8 (*)    Neutro Abs 9.2 (*)    Abs Immature Granulocytes 0.09 (*)    All other components within normal limits  COMPREHENSIVE METABOLIC PANEL - Abnormal; Notable for the following components:   BUN 25 (*)    Creatinine, Ser 2.08 (*)    Calcium 8.6 (*)    Total Protein 5.6 (*)    Albumin 3.0 (*)    GFR, Estimated 32 (*)    All other  components within normal limits  MAGNESIUM  URINALYSIS, ROUTINE W REFLEX MICROSCOPIC    EKG None  Radiology DG Chest Port 1 View  Result Date: 07/15/2023 CLINICAL DATA:  Evaluate for pneumonia EXAM: PORTABLE CHEST 1 VIEW COMPARISON:  Chest x-ray 05/29/2023 FINDINGS: Sternotomy wires are present. The heart is mildly enlarged. There is no lung infiltrate, pleural effusion or pneumothorax.  No acute fractures are seen. IMPRESSION: Mild cardiomegaly. No acute pulmonary process. Electronically Signed   By: Darliss Cheney Hess.D.   On: 07/15/2023 23:38   CT Head Wo Contrast  Result Date: 07/15/2023 CLINICAL DATA:  Neuro deficit, acute stroke suspected. EXAM: CT HEAD WITHOUT CONTRAST TECHNIQUE: Contiguous axial images were obtained from the base of the skull through the vertex without intravenous contrast. RADIATION DOSE REDUCTION: This exam was performed according to the departmental dose-optimization program which includes automated exposure control, adjustment of the mA and/or kV according to patient size and/or use of iterative reconstruction technique. COMPARISON:  CT head 05/29/2023 FINDINGS: Brain: No intracranial hemorrhage, mass effect, or evidence of acute infarct. No hydrocephalus. No extra-axial fluid collection. Age-commensurate cerebral atrophy and chronic small vessel ischemic disease. Vascular: No hyperdense vessel. Intracranial arterial calcification. Skull: No fracture or focal lesion. Sinuses/Orbits: No acute finding. Unchanged mild polypoid mucosal thickening in the maxillary sinuses. Other: None. IMPRESSION: No acute intracranial abnormality. Electronically Signed   By: Minerva Fester Hess.D.   On: 07/15/2023 21:39    Procedures Procedures    Medications Ordered in ED Medications  sodium chloride 0.9 % bolus 1,000 mL (1,000 mLs Intravenous New Bag/Given 07/15/23 1922)    ED Course/ Medical Decision Making/ A&P                                 Medical Decision Making Amount  and/or Complexity of Data Reviewed Labs: ordered. Radiology: ordered.   55 yoM with a chief complaints of diffuse tremors.  This has been an ongoing issue for him.  Patient has dementia and has trouble providing much history.  For my record review the patient had this problem at least for a couple months.  He was seen at the beginning of August and had similar symptoms reported.  Has had frequent falls and has been moved into a more advanced care area.  He most recently was in the emergency department and spent a few days here while waiting for placement and eventually improved and went home.  At that time it was thought to be a medication reaction.  He does not think he has had any recent medication changes.  I am not sure why he is here today.  I will start with lab work.  Bolus of IV fluids.  Hopefully obtain further history from family.  I went back to check on the patient.  Family is not here.  He told me that they were planning on coming until tomorrow.  I called the wife on the phone and she was able to provide further history.  She told me that he had had more shaking last night than normal while he was sleeping and then today seem to be doing well until she found him in the kitchen he was very stiff and was not responding to her.  She is not sure how long this lasted.  By the time EMS had arrived it had resolved.  She felt like he was not speaking as he normally did when EMS arrived.  She thinks maybe it was a seizure.  I asked her if any medications were changed recently and she is not sure.  She said he was taken off what sounds like a statin a couple months ago.  I asked about Keppra as that was documented and one of the notes I can see in the system and she is not aware of  him taking this medication.  Blood work is resulted with a very mild bump in his renal function.  Mild leukocytosis.  Will obtain a CT of the head based on family's added history.  CT of the head without acute  pathology.     I discussed the case with Dr. Otelia Limes neurology he thought highest on the differential would be Lewy body dementia.  He thought patient would be best served as a outpatient.  He did recommend a metabolic and infectious workup here.  He thought it would be less likely to be helpful with the patient is back to baseline to do a spot EEG but did recommend that as an option.  I discussed the results with the patient.  He would like to have the EEG done at this time.  Chest x-ray independently interpreted by me without focal infiltrate or pneumothorax.  I discussed the EEG with Dr. Otelia Limes, no acute seizure activity.  Recommended follow-up as an outpatient.  Awaiting UA.  Patient care was signed out to Dr. Pilar Plate please see his Hess for further details care in the ED.  The patients results and plan were reviewed and discussed.   Any x-rays performed were independently reviewed by myself.   Differential diagnosis were considered with the presenting HPI.  Medications  sodium chloride 0.9 % bolus 1,000 mL (1,000 mLs Intravenous New Bag/Given 07/15/23 1922)    Vitals:   07/15/23 1826 07/15/23 1945 07/15/23 2246 07/15/23 2253  BP: 114/64 (!) 162/79  (!) 153/134  Pulse: 69 84  88  Resp: 15 (!) 21  18  Temp:   97.9 F (36.6 C)   TempSrc:   Oral   SpO2: 96% 94%  95%  Weight:      Height:        Final diagnoses:  Tremor             Final Clinical Impression(s) / ED Diagnoses Final diagnoses:  Tremor    Rx / DC Orders ED Discharge Orders     None         Melene Plan, DO 07/15/23 2345

## 2023-07-15 NOTE — Progress Notes (Signed)
EEG complete - results pending 

## 2023-07-15 NOTE — ED Triage Notes (Signed)
Patient BIB [facility] from home for increase in tremors and difficulty speaking d/t his hx of myasthenia gravis. HR 65, 98% RA, CBG 90, BP 124/70 R15 Seen recently for same and condition has deteriorated since last visit.

## 2023-07-16 NOTE — ED Notes (Addendum)
Transport arrive to take pt home. Pt found sitting at the edge of the bed shaking uncontrollably, extremely SOB. Placed on O2 via  @ 4L/min. Dr Randol Kern notified.

## 2023-07-16 NOTE — ED Notes (Signed)
[facility] called, no ETA 

## 2023-07-16 NOTE — ED Notes (Signed)
Dr Pilar Plate in with patient for evaluation. After evaluating pt, he made this nurse aware that the pt is to be discharged home and is stable at present.

## 2023-07-16 NOTE — ED Notes (Signed)
Pt denies any needs at this time and is aware of wait for [facility] to transport him home. Pt verbalized understanding of same.

## 2023-07-17 ENCOUNTER — Telehealth (HOSPITAL_COMMUNITY): Payer: Self-pay

## 2023-07-17 NOTE — Telephone Encounter (Signed)
Pt.'s wife call and requested an update on pt.s urinalysis and if the urine showed an UTI.  Pt.s urinalysis was negative for bacteria or an UTI.  Pt's wife asked if the urine was going to be cultured. Explained to pt.s wife that a culture will not be completed if bacteria is not present in urine.  Pt.'s wife reports that pt 's symptoms are changing.  I advised pt.s wife to bring patient back to the ED for an evaluation.  She also has a call to speak with pt.s PCP.

## 2023-08-07 ENCOUNTER — Encounter (HOSPITAL_BASED_OUTPATIENT_CLINIC_OR_DEPARTMENT_OTHER): Payer: Self-pay

## 2023-08-07 ENCOUNTER — Emergency Department (HOSPITAL_BASED_OUTPATIENT_CLINIC_OR_DEPARTMENT_OTHER)
Admission: EM | Admit: 2023-08-07 | Discharge: 2023-08-07 | Disposition: A | Discharge status: Home / Self Care | Payer: Medicare Other | Attending: Emergency Medicine | Admitting: Emergency Medicine

## 2023-08-07 ENCOUNTER — Other Ambulatory Visit: Payer: Self-pay

## 2023-08-07 ENCOUNTER — Other Ambulatory Visit (HOSPITAL_BASED_OUTPATIENT_CLINIC_OR_DEPARTMENT_OTHER): Payer: Self-pay

## 2023-08-07 ENCOUNTER — Emergency Department (HOSPITAL_BASED_OUTPATIENT_CLINIC_OR_DEPARTMENT_OTHER): Payer: Medicare Other

## 2023-08-07 DIAGNOSIS — G7000 Myasthenia gravis without (acute) exacerbation: Secondary | ICD-10-CM | POA: Diagnosis not present

## 2023-08-07 DIAGNOSIS — S99922A Unspecified injury of left foot, initial encounter: Secondary | ICD-10-CM | POA: Diagnosis present

## 2023-08-07 DIAGNOSIS — Z7901 Long term (current) use of anticoagulants: Secondary | ICD-10-CM | POA: Insufficient documentation

## 2023-08-07 DIAGNOSIS — W1841XA Slipping, tripping and stumbling without falling due to stepping on object, initial encounter: Secondary | ICD-10-CM | POA: Insufficient documentation

## 2023-08-07 DIAGNOSIS — Z794 Long term (current) use of insulin: Secondary | ICD-10-CM | POA: Diagnosis not present

## 2023-08-07 DIAGNOSIS — F0390 Unspecified dementia without behavioral disturbance: Secondary | ICD-10-CM | POA: Diagnosis not present

## 2023-08-07 DIAGNOSIS — E119 Type 2 diabetes mellitus without complications: Secondary | ICD-10-CM | POA: Diagnosis not present

## 2023-08-07 DIAGNOSIS — W19XXXA Unspecified fall, initial encounter: Secondary | ICD-10-CM

## 2023-08-07 MED ORDER — ACETAMINOPHEN 325 MG PO TABS: 650.0000 mg | ORAL_TABLET | Freq: Once | ORAL | Status: DC

## 2023-08-07 NOTE — ED Notes (Signed)
Called [facility] for transport home @ 3:18p

## 2023-08-07 NOTE — ED Notes (Signed)
Pt's BP elevated. Pt stated that he took BP meds today. Informed Counsellor.

## 2023-08-07 NOTE — Discharge Instructions (Signed)
Your toenail came off due to injury.  Your x-ray did not show any fractures or injuries to the bones.  Your toenail may grow back or not.  We applied a dressing to the area to keep the nailbed intact and healing well.  If you have fever, more pain or swelling please return for care.  Please follow-up with your neurologist to discuss any changes in myasthenia treatment if needed.

## 2023-08-07 NOTE — ED Triage Notes (Signed)
Pt bib by [facility] from home c/o pain in second toe on left foot. EMS advises pt slid off the toilet onto the ground where his toenail was pulled off. Pt is on blood thinners. Bleeding controlled upon arrival. No other c/o pain or injury at this time. NAD

## 2023-08-07 NOTE — ED Provider Notes (Signed)
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 male.  Patient reports that he was sitting on the commode and stood up.  He was not lightheaded when he stood up however he was not able to step forward and slid to the ground causing injury to his left second toe.  He denies lightheadedness denies palpitations, or chest pain or shortness of breath.  Says that his legs feel unsteady sometimes due to his myasthenia gravis.  He has had many falls due to myasthenia gravis.  He says a similar thing has happened in the past.  Denies neuropathy in his feet.  Says he does have some tremor due to myasthenia gravis as well.  Lives with his wife.  He is unsure of where his wife is now.  Denies recent fever, URI, abdominal pain or pain anywhere else.  Patient was recently seen in the ED on 10/30 for a fall.  At that time ED physician spoke with neurologist and thought that patient might also have Lewy body dementia.        Home Medications Prior to Admission medications   Medication Sig Start Date End Date Taking? Authorizing Provider  acetaminophen (TYLENOL) 500 MG tablet Take 1,000 mg by mouth every 6 (six) hours as needed for moderate pain.    [provider]  Amino Acids-Protein Hydrolys (PRO-STAT) LIQD Take 30 mLs by mouth in the morning and at bedtime.    [provider]  apixaban (ELIQUIS) 5 MG TABS tablet Take 1 tablet (5 mg total) by mouth 2 (two) times daily. 01/01/23   Lorin Glass, MD  ascorbic acid (VITAMIN C) 500 MG tablet Take 1 tablet (500 mg total) by mouth daily. 03/02/23   Regalado, Belkys A, MD  atorvastatin (LIPITOR) 80 MG tablet TAKE ONE TABLET BY MOUTH EVERY EVENING Patient taking differently: Take 80 mg by mouth at  bedtime. 11/13/22   Tereso Newcomer T, [name]  budesonide (PULMICORT) 0.25 MG/2ML nebulizer solution Take 0.25 mg by nebulization daily. 02/13/23   [provider]  cyanocobalamin (VITAMIN B12) 1000 MCG/ML injection Inject 1,000 mcg into the muscle once a week. saturdays    [provider]  DULoxetine (CYMBALTA) 30 MG capsule Take 2 capsules (60 mg total) by mouth every morning. 05/29/23   Rondel Baton, MD  furosemide (LASIX) 20 MG tablet Take 20 mg by mouth daily.    [provider]  GVOKE HYPOPEN 2-PACK 1 MG/0.2ML SOAJ Inject into the skin. 04/03/23   [provider]  hydrALAZINE (APRESOLINE) 25 MG tablet Take 1 tablet (25 mg total) by mouth every 6 (six) hours as needed (SBP>160 or DBP>110). 01/12/23   Rhetta Mura, MD  insulin lispro (HUMALOG) 100 UNIT/ML KwikPen Inject 5 Units into the skin 3 (three) times daily. 05/15/23   [provider]  ketoconazole (NIZORAL) 2 % cream Apply 1 Application topically daily.    [provider]  LANTUS SOLOSTAR 100 UNIT/ML Solostar Pen Inject 40 Units into the skin daily. 02/19/23   [provider]  MYRBETRIQ 25 MG TB24 tablet Take 25 mg by mouth daily. 03/23/23   [provider]  nitroGLYCERIN (NITROSTAT) 0.4 MG SL tablet Place 0.4 mg under the tongue every 5 (  five) minutes as needed for chest pain.    [provider]  nystatin (MYCOSTATIN/NYSTOP) powder Apply 1 Application topically 2 (two) times daily. 05/15/23   [provider]  ondansetron (ZOFRAN-ODT) 4 MG disintegrating tablet Take 4 mg by mouth every 8 (eight) hours as needed for nausea or vomiting. 04/08/23   [provider]  pantoprazole (PROTONIX) 40 MG tablet TAKE 1 TABLET BY MOUTH EVERY DAY Patient taking differently: Take 40 mg by mouth in the morning. 11/25/19   [name], MD  QUEtiapine (SEROQUEL) 50 MG tablet Take 50 mg by mouth daily. 02/23/23   [provider]  senna-docusate  (SENOKOT-S) 8.6-50 MG tablet Take 1 tablet by mouth 2 (two) times daily as needed for moderate constipation. 01/09/23   Almon Hercules, MD  tamsulosin (FLOMAX) 0.4 MG CAPS capsule Take 1 capsule (0.4 mg total) by mouth daily after breakfast. Patient taking differently: Take 0.4 mg by mouth daily. 01/02/23   Lorin Glass, MD  tiZANidine (ZANAFLEX) 2 MG tablet Take 2 mg by mouth as needed for muscle spasms.    [provider]      Allergies    Cilostazol, Dulaglutide, Levofloxacin, Liraglutide, and Lisinopril    Review of Systems   Review of Systems  Constitutional:  Negative for fever.  Musculoskeletal:        Left second toe pain  Neurological:  Positive for tremors. Negative for dizziness, syncope, weakness, light-headedness and numbness.    Physical Exam Updated Vital Signs BP (!) 148/64 (BP Location: Right Arm)   Pulse (!) 116   Temp 97.7 F (36.5 C) (Oral)   Resp 17   Ht 5\' 6"  (1.676 m)   Wt 74.8 kg   SpO2 100%   BMI 26.63 kg/m  Physical Exam Constitutional:      General: He is not in acute distress. HENT:     Head: Normocephalic and atraumatic.     Right Ear: External ear normal.     Left Ear: External ear normal.     Nose: Nose normal.     Mouth/Throat:     Mouth: Mucous membranes are moist.  Eyes:     Extraocular Movements: Extraocular movements intact.     Pupils: Pupils are equal, round, and reactive to light.  Cardiovascular:     Rate and Rhythm: Normal rate and regular rhythm.     Pulses: Normal pulses.     Heart sounds: Normal heart sounds.  Pulmonary:     Effort: Pulmonary effort is normal.  Abdominal:     General: Abdomen is flat.     Palpations: Abdomen is soft.     Tenderness: There is no abdominal tenderness.  Musculoskeletal:        General: Tenderness and signs of injury present. Normal range of motion.     Cervical back: Normal range of motion. No tenderness.     Comments: Avulsed toenail of second toe of left foot, hemostatic  Left  toe slightly raised compared to the right. No pain over the joints, no edema  Skin:    General: Skin is warm and dry.     Capillary Refill: Capillary refill takes less than 2 seconds.  Neurological:     General: No focal deficit present.     Mental Status: He is alert and oriented to person, place, and time. Mental status is at baseline.     Cranial Nerves: No cranial nerve deficit.     Sensory: No sensory deficit.  Motor: No weakness.     Comments: Oriented however has some difficulty with memory and speaking full sentences.  Has a constant slight tremor.     ED Results / Procedures / Treatments   Labs (all labs ordered are listed, but only abnormal results are displayed) Labs Reviewed - No data to display  EKG None  Radiology DG Foot Complete Left  Result Date: 08/07/2023 CLINICAL DATA:  Second toe injury EXAM: LEFT FOOT - COMPLETE 3+ VIEW COMPARISON:  None Available. FINDINGS: Toes are flexed during imaging causing some bony superimposition, with a hammertoe configuration in the second through fifth digits. Degenerative findings at the first MTP joint with associated subcortical sclerosis. Mild irregularity in the distal fifth metatarsal compatible with an old healed fracture. Small enchondroma or degenerative subcortical cystic lesion distally in the proximal phalanx of the fifth toe. Plantar calcaneal spur. Vascular calcifications noted. No definite fracture identified. IMPRESSION: 1. No acute fracture identified. 2. Hammertoe configuration in the second through fifth digits. 3. Degenerative findings at the first MTP joint. 4. Small enchondroma or degenerative subcortical cystic lesion distally in the proximal phalanx of the fifth toe. 5. Vascular calcifications. Electronically Signed   By: Gaylyn Rong M.D.   On: 08/07/2023 14:39    Procedures Procedures   Medications Ordered in ED Medications - No data to display  ED Course/ Medical Decision Making/ A&P Clinical  Course as of 08/07/23 1514  Fri Aug 07, 2023  1444 DG Foot Complete Left No evidence of acute fracture. 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 The complaint involves an extensive differential diagnosis and also carries with it a high risk of complications and morbidity.    The differential diagnosis includes avulsed toe nail, toe fracture, fall due to cardiac causes, fall due to myasthenia gravis weakness, lewy body dementia effects.    Additional history obtained: Records reviewed previous admission documents and Primary Care Documents  ED Course and Reassessment:  Independent visualization of imaging: - I independently visualized the following imaging with scope of interpretation limited to determining acute life threatening conditions related to emergency care: Toe xray, which revealed hammer toe abnormalities   Consideration for admission or further workup: Unlikely that fall is cardiac as this is very consistent with his previous falls and patient says he has weakness in his legs occasionally. Normal sensation. No cardiac symptoms or prodrome.   Social Determinants of health: Disabled, elderly with not many resources. Patient's wife is having surgery today, but has a neighbor who can help him     Amount and/or Complexity of Data Reviewed External Data Reviewed: notes. Radiology: ordered. Decision-making details documented in ED Course.  Risk OTC drugs.   Patient does have home health however he does not have that many resources at home does live with his wife but otherwise is disabled.  Final Clinical  Impression(s) / ED Diagnoses Final diagnoses:  Fall, initial encounter  Myasthenia gravis, adult form ([name])  Injury of toe on left foot, initial encounter  Rx / DC Orders ED Discharge Orders     None         Lockie Mola, MD 08/07/23 1514    Alvira Monday, MD 08/07/23 2246

## 2023-08-08 NOTE — Progress Notes (Deleted)
HPI M never smoker followed for OSA, complicated by CAD/ MI/ RBBB/ CABG, PAD, HTN, dCHF, OSA, DM2/ retinopathy, CKD3, Myasthenia Gravis,  ENT/ [facility]- Septoplasty/ turbinate reduction 08/30/21. NPSG ([facility]) 11/25/18- AHI 37.9/ hr, desaturation to 79%, body weight 201 lbs Walk Test Room Air 06/18/21- 3 Steady laps- lowest O2 sat 95%, max HR 103. PFT 04/21/22- Moderate Obstruction, weak effort. Per Tech-Patient refused DLCP/ Pleth  CPET 11/20/21- normal with exercise limitedd by deconditioning and body habitus ================================================================  06/10/22- [name] never smoker followed for OSA, complicated by CAD/ MI/ RBBB/ CABG/ AFib, PAD, HTN, dCHF,  DM2/ retinopathy, CKD3, Myasthenia Gravis -[name] 5, Melatonin, Cymbalta, Albuterol hfa,  CPAP- auto 5-15 / [employer]        AirSense 10 AutoSet                           Wife here Download compliance- 14%, AHI 2.8/hr Body weight today-  184 lbs                             Covid vax-3 Phizer He got concerned that the Respironics recall and questions about Ozone cleaning devices might somehow also relate to his ResMed machine and might aggravate his Myasthenia Gravis. I told him I know of no such connection. He is going to resume use of his machine. We are out of senior strength flu vax- he chose to get standard flu vax today since here. Pulse IRR on palpation today. Wife confirms he has hx AFib/ Eliquis. CT chest 05/01/22- IMPRESSION: No acute pulmonary abnormality.  08/10/23- [name] never smoker followed for OSA, complicated by CAD/ MI/ RBBB/ CABG/ AFib/ Eliquis, PAD, HTN, dCHF,  DM2/ retinopathy, CKD3, Myasthenia Gravis/ frequent falls, -[name] 5, Melatonin, Cymbalta, Albuterol hfa, Neb Budesonide CPAP- auto 5-15 / [employer]         AirSense 10 AutoSet                           Wife here Download compliance-  Body weight today-      CXR 07/15/23- 1V IMPRESSION: Mild cardiomegaly. No acute pulmonary process.  ROS-see HPI    + = positive Constitutional:    weight loss, night sweats, fevers, chills, fatigue, lassitude. HEENT:    headaches, difficulty swallowing, tooth/dental problems, sore throat,       sneezing, itching, ear ache, nasal congestion, post nasal drip, snoring CV:    chest pain, orthopnea, PND, swelling in lower extremities, anasarca,                                dizziness, palpitations Resp:   +shortness of breath with exertion or at rest.                productive cough,   non-productive cough, coughing up of blood.              change in color of mucus.  wheezing.   Skin:    rash or lesions. GI:  No-   heartburn, indigestion, abdominal pain, nausea, vomiting, diarrhea,                 change in bowel habits, loss of appetite GU: dysuria, change in color of urine, no urgency or frequency.   flank pain. MS:   joint pain, stiffness, decreased range of  motion, back pain. Neuro-     nothing unusual Psych:  change in mood or affect.  depression or anxiety.   memory loss.   OBJ- Physical Exam     Unlabored General- Alert, Oriented, Affect-appropriate, Distress- none acute, +obese Skin- rash-none, lesions- none, excoriation- none Lymphadenopathy- none Head- atraumatic            Eyes- Gross vision intact, PERRLA, conjunctivae and secretions clear            Ears- Hearing, canals-normal            Nose- Clear, no-Septal dev, mucus, polyps, erosion, perforation             Throat- Mallampati III , mucosa clear , drainage- none, tonsils- atrophic,  +teeth/ some repair, +weak voice Neck- flexible , trachea midline, no stridor , thyroid nl, carotid no bruit Chest - symmetrical excursion , unlabored           Heart/CV- IRR/ AFib , no murmur , no gallop  , no rub, nl s1 s2                           - JVD- none , edema- none, stasis changes- none, varices- none           Lung- clear to P&A, wheeze- none, cough- none , dullness-none, rub- none           Chest wall-  Abd-  Br/ Gen/ Rectal- Not done,  not indicated Extrem- cyanosis- none, clubbing, none, atrophy- none, strength- nl Neuro- grossly intact to observation

## 2023-08-10 ENCOUNTER — Ambulatory Visit: Payer: Medicare Other | Admitting: Internal Medicine

## 2023-09-11 ENCOUNTER — Inpatient Hospital Stay (HOSPITAL_COMMUNITY)
Admission: EM | Admit: 2023-09-11 | Discharge: 2023-09-17 | DRG: 948 | Disposition: A | Discharge status: Skilled Nursing Facility | Payer: Medicare Other | Attending: Internal Medicine | Admitting: Internal Medicine

## 2023-09-11 ENCOUNTER — Emergency Department (HOSPITAL_COMMUNITY): Payer: Medicare Other

## 2023-09-11 ENCOUNTER — Other Ambulatory Visit: Payer: Self-pay

## 2023-09-11 ENCOUNTER — Encounter (HOSPITAL_COMMUNITY): Payer: Self-pay | Admitting: Emergency Medicine

## 2023-09-11 DIAGNOSIS — F418 Other specified anxiety disorders: Secondary | ICD-10-CM

## 2023-09-11 DIAGNOSIS — E113392 Type 2 diabetes mellitus with moderate nonproliferative diabetic retinopathy without macular edema, left eye: Secondary | ICD-10-CM | POA: Diagnosis present

## 2023-09-11 DIAGNOSIS — Z881 Allergy status to other antibiotic agents status: Secondary | ICD-10-CM

## 2023-09-11 DIAGNOSIS — Z7984 Long term (current) use of oral hypoglycemic drugs: Secondary | ICD-10-CM

## 2023-09-11 DIAGNOSIS — Z87442 Personal history of urinary calculi: Secondary | ICD-10-CM

## 2023-09-11 DIAGNOSIS — I5032 Chronic diastolic (congestive) heart failure: Secondary | ICD-10-CM

## 2023-09-11 DIAGNOSIS — G4733 Obstructive sleep apnea (adult) (pediatric): Secondary | ICD-10-CM

## 2023-09-11 DIAGNOSIS — N1832 Chronic kidney disease, stage 3b: Secondary | ICD-10-CM

## 2023-09-11 DIAGNOSIS — Z7951 Long term (current) use of inhaled steroids: Secondary | ICD-10-CM

## 2023-09-11 DIAGNOSIS — E785 Hyperlipidemia, unspecified: Secondary | ICD-10-CM | POA: Diagnosis present

## 2023-09-11 DIAGNOSIS — Z96641 Presence of right artificial hip joint: Secondary | ICD-10-CM | POA: Diagnosis present

## 2023-09-11 DIAGNOSIS — R531 Weakness: Principal | ICD-10-CM

## 2023-09-11 DIAGNOSIS — F0390 Unspecified dementia without behavioral disturbance: Secondary | ICD-10-CM | POA: Diagnosis not present

## 2023-09-11 DIAGNOSIS — N261 Atrophy of kidney (terminal): Secondary | ICD-10-CM | POA: Diagnosis present

## 2023-09-11 DIAGNOSIS — Z833 Family history of diabetes mellitus: Secondary | ICD-10-CM

## 2023-09-11 DIAGNOSIS — G7000 Myasthenia gravis without (acute) exacerbation: Secondary | ICD-10-CM | POA: Diagnosis present

## 2023-09-11 DIAGNOSIS — Z7901 Long term (current) use of anticoagulants: Secondary | ICD-10-CM

## 2023-09-11 DIAGNOSIS — E16A1 Hypoglycemia level 1: Secondary | ICD-10-CM | POA: Diagnosis not present

## 2023-09-11 DIAGNOSIS — Z951 Presence of aortocoronary bypass graft: Secondary | ICD-10-CM

## 2023-09-11 DIAGNOSIS — I130 Hypertensive heart and chronic kidney disease with heart failure and stage 1 through stage 4 chronic kidney disease, or unspecified chronic kidney disease: Secondary | ICD-10-CM | POA: Diagnosis present

## 2023-09-11 DIAGNOSIS — I480 Paroxysmal atrial fibrillation: Secondary | ICD-10-CM

## 2023-09-11 DIAGNOSIS — Z800 Family history of malignant neoplasm of digestive organs: Secondary | ICD-10-CM

## 2023-09-11 DIAGNOSIS — Z8249 Family history of ischemic heart disease and other diseases of the circulatory system: Secondary | ICD-10-CM

## 2023-09-11 DIAGNOSIS — E11649 Type 2 diabetes mellitus with hypoglycemia without coma: Secondary | ICD-10-CM | POA: Diagnosis not present

## 2023-09-11 DIAGNOSIS — Z79899 Other long term (current) drug therapy: Secondary | ICD-10-CM

## 2023-09-11 DIAGNOSIS — I252 Old myocardial infarction: Secondary | ICD-10-CM

## 2023-09-11 DIAGNOSIS — M62838 Other muscle spasm: Secondary | ICD-10-CM | POA: Diagnosis not present

## 2023-09-11 DIAGNOSIS — I2510 Atherosclerotic heart disease of native coronary artery without angina pectoris: Secondary | ICD-10-CM

## 2023-09-11 DIAGNOSIS — I5030 Unspecified diastolic (congestive) heart failure: Secondary | ICD-10-CM | POA: Diagnosis present

## 2023-09-11 DIAGNOSIS — I350 Nonrheumatic aortic (valve) stenosis: Secondary | ICD-10-CM | POA: Diagnosis present

## 2023-09-11 DIAGNOSIS — E1169 Type 2 diabetes mellitus with other specified complication: Secondary | ICD-10-CM

## 2023-09-11 DIAGNOSIS — E1122 Type 2 diabetes mellitus with diabetic chronic kidney disease: Secondary | ICD-10-CM | POA: Diagnosis present

## 2023-09-11 DIAGNOSIS — Z888 Allergy status to other drugs, medicaments and biological substances status: Secondary | ICD-10-CM

## 2023-09-11 DIAGNOSIS — R0602 Shortness of breath: Principal | ICD-10-CM | POA: Diagnosis present

## 2023-09-11 DIAGNOSIS — E1151 Type 2 diabetes mellitus with diabetic peripheral angiopathy without gangrene: Secondary | ICD-10-CM | POA: Diagnosis present

## 2023-09-11 DIAGNOSIS — E119 Type 2 diabetes mellitus without complications: Secondary | ICD-10-CM

## 2023-09-11 DIAGNOSIS — Z794 Long term (current) use of insulin: Secondary | ICD-10-CM

## 2023-09-11 LAB — BASIC METABOLIC PANEL
Anion gap: 8 (ref 5–15)
BUN: 21 mg/dL (ref 8–23)
CO2: 26 mmol/L (ref 22–32)
Calcium: 8.8 mg/dL — ABNORMAL LOW (ref 8.9–10.3)
Chloride: 106 mmol/L (ref 98–111)
Creatinine, Ser: 1.92 mg/dL — ABNORMAL HIGH (ref 0.61–1.24)
GFR, Estimated: 35 mL/min — ABNORMAL LOW (ref >60)
Glucose, Bld: 162 mg/dL — ABNORMAL HIGH (ref 70–99)
Potassium: 4.4 mmol/L (ref 3.5–5.1)
Sodium: 140 mmol/L (ref 135–145)

## 2023-09-11 LAB — I-STAT VENOUS BLOOD GAS, ED
Acid-Base Excess: 1 mmol/L (ref 0.0–2.0)
Bicarbonate: 28.1 mmol/L — ABNORMAL HIGH (ref 20.0–28.0)
Calcium, Ion: 1.12 mmol/L — ABNORMAL LOW (ref 1.15–1.40)
HCT: 42 % (ref 39.0–52.0)
Hemoglobin: 14.3 g/dL (ref 13.0–17.0)
O2 Saturation: 98 %
Potassium: 4.3 mmol/L (ref 3.5–5.1)
Sodium: 140 mmol/L (ref 135–145)
TCO2: 30 mmol/L (ref 22–32)
pCO2, Ven: 54.9 mm[Hg] (ref 44–60)
pH, Ven: 7.317 (ref 7.25–7.43)
pO2, Ven: 118 mm[Hg] — ABNORMAL HIGH (ref 32–45)

## 2023-09-11 LAB — TROPONIN I (HIGH SENSITIVITY)
Troponin I (High Sensitivity): 18 ng/L — ABNORMAL HIGH (ref <18)
Troponin I (High Sensitivity): 16 ng/L (ref <18)

## 2023-09-11 LAB — CBC WITH DIFFERENTIAL/PLATELET
Abs Immature Granulocytes: 0.09 10*3/uL — ABNORMAL HIGH (ref 0.00–0.07)
Basophils Absolute: 0.1 10*3/uL (ref 0.0–0.1)
Basophils Relative: 1 %
Eosinophils Absolute: 0.3 10*3/uL (ref 0.0–0.5)
Eosinophils Relative: 3 %
HCT: 43.3 % (ref 39.0–52.0)
Hemoglobin: 13.5 g/dL (ref 13.0–17.0)
Immature Granulocytes: 1 %
Lymphocytes Relative: 19 %
Lymphs Abs: 1.6 10*3/uL (ref 0.7–4.0)
MCH: 28.8 pg (ref 26.0–34.0)
MCHC: 31.2 g/dL (ref 30.0–36.0)
MCV: 92.3 fL (ref 80.0–100.0)
Monocytes Absolute: 0.9 10*3/uL (ref 0.1–1.0)
Monocytes Relative: 11 %
Neutro Abs: 5.4 10*3/uL (ref 1.7–7.7)
Neutrophils Relative %: 65 %
Platelets: 188 10*3/uL (ref 150–400)
RBC: 4.69 MIL/uL (ref 4.22–5.81)
RDW: 15.6 % — ABNORMAL HIGH (ref 11.5–15.5)
WBC: 8.4 10*3/uL (ref 4.0–10.5)
nRBC: 0 % (ref 0.0–0.2)

## 2023-09-11 LAB — BRAIN NATRIURETIC PEPTIDE: B Natriuretic Peptide: 126.3 pg/mL — ABNORMAL HIGH (ref 0.0–100.0)

## 2023-09-11 LAB — CBG MONITORING, ED: Glucose-Capillary: 188 mg/dL — ABNORMAL HIGH (ref 70–99)

## 2023-09-11 MED ORDER — DULOXETINE HCL 20 MG PO CPEP
20.0000 mg | ORAL_CAPSULE | Freq: Every day | ORAL | Status: DC
  Administered 2023-09-12 – 2023-09-17 (×6): 20 mg via ORAL
  Filled 2023-09-11 (×6): qty 1

## 2023-09-11 MED ORDER — PANTOPRAZOLE SODIUM 40 MG PO TBEC
40.0000 mg | DELAYED_RELEASE_TABLET | Freq: Every morning | ORAL | Status: DC
  Administered 2023-09-12 – 2023-09-17 (×6): 40 mg via ORAL
  Filled 2023-09-11 (×6): qty 1

## 2023-09-11 MED ORDER — ACETAMINOPHEN 325 MG PO TABS: 650.0000 mg | ORAL_TABLET | Freq: Four times a day (QID) | ORAL | Status: DC | PRN

## 2023-09-11 MED ORDER — INSULIN ASPART 100 UNIT/ML IJ SOLN
0.0000 [IU] | Freq: Three times a day (TID) | INTRAMUSCULAR | Status: DC
  Administered 2023-09-12: 1 [IU] via SUBCUTANEOUS
  Administered 2023-09-12: 2 [IU] via SUBCUTANEOUS
  Administered 2023-09-13: 1 [IU] via SUBCUTANEOUS
  Administered 2023-09-15: 3 [IU] via SUBCUTANEOUS
  Administered 2023-09-15: 2 [IU] via SUBCUTANEOUS
  Administered 2023-09-16: 1 [IU] via SUBCUTANEOUS
  Administered 2023-09-16 (×2): 2 [IU] via SUBCUTANEOUS
  Administered 2023-09-17: 3 [IU] via SUBCUTANEOUS

## 2023-09-11 MED ORDER — DONEPEZIL HCL 10 MG PO TABS
10.0000 mg | ORAL_TABLET | Freq: Every day | ORAL | Status: DC
  Administered 2023-09-11 – 2023-09-16 (×6): 10 mg via ORAL
  Filled 2023-09-11 (×6): qty 1

## 2023-09-11 MED ORDER — BUDESONIDE 0.25 MG/2ML IN SUSP
0.2500 mg | Freq: Every day | RESPIRATORY_TRACT | Status: DC
  Administered 2023-09-12 – 2023-09-17 (×6): 0.25 mg via RESPIRATORY_TRACT
  Filled 2023-09-11 (×6): qty 2

## 2023-09-11 MED ORDER — ACETAMINOPHEN 650 MG RE SUPP: 650.0000 mg | Freq: Four times a day (QID) | RECTAL | Status: DC | PRN

## 2023-09-11 MED ORDER — TAMSULOSIN HCL 0.4 MG PO CAPS
0.4000 mg | ORAL_CAPSULE | Freq: Every day | ORAL | Status: DC
  Administered 2023-09-12 – 2023-09-17 (×6): 0.4 mg via ORAL
  Filled 2023-09-11 (×6): qty 1

## 2023-09-11 MED ORDER — MIRABEGRON ER 25 MG PO TB24
25.0000 mg | ORAL_TABLET | Freq: Every day | ORAL | Status: DC
  Administered 2023-09-12 – 2023-09-17 (×6): 25 mg via ORAL
  Filled 2023-09-11 (×7): qty 1

## 2023-09-11 MED ORDER — BUPROPION HCL ER (XL) 150 MG PO TB24
300.0000 mg | ORAL_TABLET | Freq: Every day | ORAL | Status: DC
  Administered 2023-09-11 – 2023-09-17 (×7): 300 mg via ORAL
  Filled 2023-09-11 (×7): qty 2

## 2023-09-11 MED ORDER — SENNOSIDES-DOCUSATE SODIUM 8.6-50 MG PO TABS
1.0000 | ORAL_TABLET | Freq: Two times a day (BID) | ORAL | Status: DC | PRN
  Filled 2023-09-11: qty 1

## 2023-09-11 MED ORDER — HYDRALAZINE HCL 20 MG/ML IJ SOLN: 5.0000 mg | INTRAMUSCULAR | Status: DC | PRN

## 2023-09-11 MED ORDER — LINAGLIPTIN 5 MG PO TABS
5.0000 mg | ORAL_TABLET | Freq: Every day | ORAL | Status: DC
  Administered 2023-09-12 – 2023-09-17 (×6): 5 mg via ORAL
  Filled 2023-09-11 (×6): qty 1

## 2023-09-11 MED ORDER — APIXABAN 5 MG PO TABS
5.0000 mg | ORAL_TABLET | Freq: Two times a day (BID) | ORAL | Status: DC
  Administered 2023-09-11 – 2023-09-17 (×12): 5 mg via ORAL
  Filled 2023-09-11 (×12): qty 1

## 2023-09-11 MED ORDER — QUETIAPINE FUMARATE 50 MG PO TABS
50.0000 mg | ORAL_TABLET | Freq: Every day | ORAL | Status: DC
  Administered 2023-09-11 – 2023-09-17 (×7): 50 mg via ORAL
  Filled 2023-09-11: qty 1
  Filled 2023-09-11: qty 2
  Filled 2023-09-11 (×2): qty 1
  Filled 2023-09-11: qty 2
  Filled 2023-09-11 (×2): qty 1

## 2023-09-11 MED ORDER — INSULIN GLARGINE-YFGN 100 UNIT/ML ~~LOC~~ SOLN
36.0000 [IU] | Freq: Every day | SUBCUTANEOUS | Status: DC
  Administered 2023-09-12 – 2023-09-17 (×6): 36 [IU] via SUBCUTANEOUS
  Filled 2023-09-11 (×8): qty 0.36

## 2023-09-11 NOTE — Assessment & Plan Note (Addendum)
Generalized weakness + SOB. Seen by neuro due to concern for myasthenic crisis.  Neuro is actually less impressed for myasthenic crisis (see their consult note). Defer to neuro regarding the MG question / treatment NIF / TV Q4H ordered In mean time ill look at other possible causes of his SOB: CT chest w/o contrast to look for intrinsic lung dz PE seems less likely given that he is on chronic eliquis for PAF. Also has CKD 3b 2d echo to look for heart dz Tele monitor Cont pulse ox PT/OT

## 2023-09-11 NOTE — Assessment & Plan Note (Signed)
Cont bupropion Cont Aricept Cont Cymbalta Cont seroquel

## 2023-09-11 NOTE — Progress Notes (Signed)
Pt preformed NIF at -40 with good pt effort. MD made aware. No new orders at this time.

## 2023-09-11 NOTE — Progress Notes (Signed)
Pt preformed with good effort : NIF -24 VC 1.25 L

## 2023-09-11 NOTE — H&P (Shared)
History and Physical    Patient: Fernando Hess NFA:[id number] DOB: 16-Mar-1946 DOA: 09/11/2023 DOS: the patient was seen and examined on 09/11/2023 PCP: [name], MD  Patient coming from: {Point_of_Origin:[address]}  Chief Complaint:  Chief Complaint  Patient presents with   Shortness of Breath   HPI: Fernando Hess is a 77 y.o. male with medical history significant of ***  Review of Systems: {ROS_Text:26778} Past Medical History:  Diagnosis Date   Acute renal failure (ARF) ([name]) 09/24/2015   Acute stress disorder 11/13/2021   Atrial premature complexes    CAD (coronary artery disease) of artery bypass graft    Early occlusion of saphenous vein graft to intermediate and marginal branch in February 2007 following bypass grafting    CAD (coronary artery disease), native coronary artery 2017   hx NSTEMI 09-24-2015  s/p  CABG x5 on 10-02-2015;  post op STEMI inferolateral wall,  SVG OM1 and SVG OM2 occluded, distal OM occlusion the calpruit, treated medically // Myoview 7/21: no ischemia, EF 65, low risk   CHF (congestive heart failure) ([name])    CKD (chronic kidney disease), stage III ([name])    Contusion of right knee 03/16/2020   Dyspnea    Elevated troponin    Erectile dysfunction    Esophageal reflux    History of atrial fibrillation    post op CABG 10-02-2015   History of kidney stones    History of non-ST elevation myocardial infarction (NSTEMI) 09/24/2015   s/p  CABG x5   History of ST elevation myocardial infarction (STEMI) 10/22/2015   inferior wall,  post op CABG 10-02-2015   Hyperlipidemia    Hypertension    Left ureteral stone    Mild atherosclerosis of both carotid arteries    Nephrolithiasis    per CT bilateral non-obstructive calculi   OSA (obstructive sleep apnea)    Peripheral artery disease    LE Arterial US 01/2019: R PTA and ATA occluded; L ATA occluded   RBBB (right bundle branch block)    Renal atrophy, right    Sleep apnea    wears cpap    ST  elevation myocardial infarction (STEMI) of inferior wall ([name]) 10/22/2015   Type 2 diabetes mellitus treated with insulin ([name])    followed by pcp   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) 03/01/2008   Wears glasses    Past Surgical History:  Procedure Laterality Date   APPENDECTOMY  1965   CARDIAC CATHETERIZATION N/A 09/26/2015   Procedure: Left Heart Cath and Coronary Angiography;  Surgeon: Lennette Bihari, MD;  Location: [facility] INVASIVE CV LAB;  Service: Cardiovascular;  Laterality: N/A;   CARDIAC CATHETERIZATION N/A 10/22/2015   Procedure: Left Heart Cath and Coronary Angiography;  Surgeon: Tonny Bollman, MD;  Location: [facility] INVASIVE CV LAB;  Service: Cardiovascular;  Laterality: N/A;   CATARACT EXTRACTION W/ INTRAOCULAR LENS  IMPLANT, BILATERAL  2017   COLONOSCOPY     CORONARY ARTERY BYPASS GRAFT N/A 10/02/2015   Procedure: CORONARY ARTERY BYPASS GRAFTING (CABG) X5 LIMA-LAD; SVG-DIAG; SVG-OM; SVG-PD; SVG-RAMUS TRANSESOPHAGEAL ECHOCARDIOGRAM (TEE) ENDOSCOPIC GREATER SAPHENOUS VEIN  HARVEST BILAT LE;  Surgeon: Kerin Perna, MD;  Location: [facility] OR;  Service: Open Heart Surgery;  Laterality: N/A;   CYSTOSCOPY/URETEROSCOPY/HOLMIUM LASER/STENT PLACEMENT Left 08/10/2018   Procedure: CYSTOSCOPY/URETEROSCOPY/HOLMIUM LASER/STENT PLACEMENT;  Surgeon: Jerilee Field, MD;  Location: [facility];  Service: Urology;  Laterality: Left;   CYSTOSCOPY/URETEROSCOPY/HOLMIUM LASER/STENT PLACEMENT Left 09/10/2018  Procedure: CYSTOSCOPY/URETEROSCOPY/HOLMIUM LASER/STENT EXCHANGE;  Surgeon: Jerilee Field, MD;  Location: [facility] ORS;  Service: Urology;  Laterality: Left;   HIP ARTHROPLASTY Right 12/26/2022   Procedure: RIGHT HEMI HIP ARTHROPLASTY;  Surgeon: Joen Laura, MD;  Location: [facility] OR;  Service: Orthopedics;  Laterality: Right;   LEFT HEART CATHETERIZATION WITH CORONARY ANGIOGRAM N/A 04/13/2014   Procedure: LEFT  HEART CATHETERIZATION WITH CORONARY ANGIOGRAM;  Surgeon: Othella Boyer, MD;  Location: [facility] CATH LAB;  Service: Cardiovascular;  Laterality: N/A;   LEG SURGERY Right age 50   closed reduction leg fracture   NASAL SEPTOPLASTY W/ TURBINOPLASTY Bilateral 08/30/2021   Procedure: NASAL SEPTOPLASTY WITH BILATERAL INFERIOR TURBINATE REDUCTION;  Surgeon: Osborn Coho, MD;  Location[facility] OR;  Service: ENT;  Laterality: Bilateral;   POLYPECTOMY     RIGHT HEART CATH N/A 06/06/2021   Procedure: RIGHT HEART CATH;  Surgeon: Dolores Patty, MD;  Location: [facility] INVASIVE CV LAB;  Service: Cardiovascular;  Laterality: N/A;   TEE WITHOUT CARDIOVERSION N/A 10/02/2015   Procedure: TRANSESOPHAGEAL ECHOCARDIOGRAM (TEE);  Surgeon: Kerin Perna, MD;  Location: [facility] OR;  Service: Open Heart Surgery;  Laterality: N/A;   URETEROSCOPY WITH HOLMIUM LASER LITHOTRIPSY Bilateral 2004;  2005  dr grapey  @[facility]    VASECTOMY     Social History:  reports that he has never smoked. He has never used smokeless tobacco. He reports that he does not currently use alcohol. He reports that he does not use drugs.  Allergies  Allergen Reactions   Cilostazol Swelling and Other (See Comments)    Edema    Dulaglutide Nausea And Vomiting and Other (See Comments)    TRULICITY   Levofloxacin Hives, Itching and Rash   Liraglutide Other (See Comments)    Severe fatigue & insomnia   Lisinopril Itching, Rash and Cough    Family History  Problem Relation Age of Onset   Heart attack Mother    Heart attack Father    Diabetes Brother    Pancreatic cancer Brother    Diabetes Brother    Colon cancer Neg Hx    Esophageal cancer Neg Hx    Prostate cancer Neg Hx    Rectal cancer Neg Hx    Stomach cancer Neg Hx    Colon polyps Neg Hx     Prior to Admission medications   Medication Sig Start Date End Date Taking? Authorizing Provider  acetaminophen (TYLENOL) 500 MG tablet Take 1,000 mg by mouth every 6 (six) hours as needed for  moderate pain.   Yes [provider]  Amino Acids-Protein Hydrolys (PRO-STAT) LIQD Take 30 mLs by mouth in the morning and at bedtime.   Yes [provider]  apixaban (ELIQUIS) 5 MG TABS tablet Take 1 tablet (5 mg total) by mouth 2 (two) times daily. 01/01/23  Yes Dahal, Melina Schools, MD  ascorbic acid (VITAMIN C) 500 MG tablet Take 1 tablet (500 mg total) by mouth daily. 03/02/23  Yes Regalado, Belkys A, MD  budesonide (PULMICORT) 0.25 MG/2ML nebulizer solution Take 0.25 mg by nebulization daily. 02/13/23  Yes [provider]  buPROPion (WELLBUTRIN XL) 300 MG 24 hr tablet Take 300 mg by mouth daily.   Yes [provider]  cyanocobalamin (VITAMIN B12) 1000 MCG/ML injection Inject 1,000 mcg into the muscle once a week. saturdays   Yes [provider]  donepezil (ARICEPT) 10 MG tablet Take 10 mg by mouth at bedtime.   Yes [provider]  DULoxetine (CYMBALTA) 20 MG capsule Take 20  mg by mouth daily.   Yes [provider]  furosemide (LASIX) 20 MG tablet Take 20 mg by mouth daily as needed for fluid or edema.   Yes [provider]  GVOKE HYPOPEN 2-PACK 1 MG/0.2ML SOAJ Inject into the skin. 04/03/23  Yes [provider]  hydrALAZINE (APRESOLINE) 25 MG tablet Take 1 tablet (25 mg total) by mouth every 6 (six) hours as needed (SBP>160 or DBP>110). 01/12/23  Yes Samtani, Jai-Gurmukh, MD  insulin lispro (HUMALOG) 100 UNIT/ML KwikPen Inject 5 Units into the skin 3 (three) times daily. 05/15/23  Yes [provider]  LANTUS SOLOSTAR 100 UNIT/ML Solostar Pen Inject 36 Units into the skin daily. 02/19/23  Yes [provider]  MYRBETRIQ 25 MG TB24 tablet Take 25 mg by mouth daily. 03/23/23  Yes [provider]  nitroGLYCERIN (NITROSTAT) 0.4 MG SL tablet Place 0.4 mg under the tongue every 5 (five) minutes as needed for chest pain.   Yes [provider]  ondansetron (ZOFRAN-ODT) 4 MG disintegrating tablet Take 4 mg  by mouth every 8 (eight) hours as needed for nausea or vomiting. 04/08/23  Yes [provider]  pantoprazole (PROTONIX) 40 MG tablet TAKE 1 TABLET BY MOUTH EVERY DAY Patient taking differently: Take 40 mg by mouth in the morning. 11/25/19  Yes [name], MD  QUEtiapine (SEROQUEL) 50 MG tablet Take 50 mg by mouth daily. 02/23/23  Yes [provider]  senna-docusate (SENOKOT-S) 8.6-50 MG tablet Take 1 tablet by mouth 2 (two) times daily as needed for moderate constipation. 01/09/23  Yes Almon Hercules, MD  tamsulosin (FLOMAX) 0.4 MG CAPS capsule Take 1 capsule (0.4 mg total) by mouth daily after breakfast. 01/02/23  Yes Dahal, Melina Schools, MD  tiZANidine (ZANAFLEX) 2 MG tablet Take 2 mg by mouth as needed for muscle spasms.   Yes [provider]  TRADJENTA 5 MG TABS tablet Take 5 mg by mouth daily.   Yes [provider]    Physical Exam: Vitals:   09/11/23 1945 09/11/23 2000 09/11/23 2015 09/11/23 2045  BP: (!) 177/100 (!) 162/94 (!) 190/79 (!) 173/93  Pulse: 69 71 72 71  Resp: (!) 27 (!) 22 15 18   Temp:      TempSrc:      SpO2: 100% 100% 100% 99%  Weight:      Height:       *** Data Reviewed: {Tip this will not be part of the note when signed- Document your independent interpretation of telemetry tracing, EKG, lab, Radiology test or any other diagnostic tests. Add any new diagnostic test ordered today. (Optional):26781} {Results:26384}  Assessment and Plan: No notes have been filed under this hospital service. Service: Hospitalist     Advance Care Planning:   Code Status: Full Code ***  Consults: ***  Family Communication: ***  Severity of Illness: {Observation/Inpatient:21159}  Author: Hillary Bow., DO 09/11/2023 9:42 PM  For on call review [email or web address].

## 2023-09-11 NOTE — ED Provider Notes (Signed)
[facility] Provider Note   CSN: [id number] Arrival date & time: 09/11/23  1431     History  Chief Complaint  Patient presents with   Shortness of Breath    Fernando Hess is a 77 y.o. male.  Patient is a 77 year old male with a past medical history of myasthenia gravis, A-fib on Eliquis, CAD status post CABG, CHF, hypertension, CKD presenting to the emergency department with shortness of breath.  The patient reports he has been short of breath for the last 2 days.  He states that he was unable to sleep last night due to his shortness of breath.  He denies any recent fever, cough, congestion or sore throat.  He denies associated chest pain.  He denies lower extremity swelling.  He states that this feels similar to prior flares of his myasthenia gravis.  States he has never required intubation for his myasthenia.  The history is provided by the patient.  Shortness of Breath      Home Medications Prior to Admission medications   Medication Sig Start Date End Date Taking? Authorizing Provider  acetaminophen (TYLENOL) 500 MG tablet Take 1,000 mg by mouth every 6 (six) hours as needed for moderate pain.    [provider]  Amino Acids-Protein Hydrolys (PRO-STAT) LIQD Take 30 mLs by mouth in the morning and at bedtime.    [provider]  apixaban (ELIQUIS) 5 MG TABS tablet Take 1 tablet (5 mg total) by mouth 2 (two) times daily. 01/01/23   Lorin Glass, MD  ascorbic acid (VITAMIN C) 500 MG tablet Take 1 tablet (500 mg total) by mouth daily. 03/02/23   Regalado, Belkys A, MD  atorvastatin (LIPITOR) 80 MG tablet TAKE ONE TABLET BY MOUTH EVERY EVENING Patient taking differently: Take 80 mg by mouth at bedtime. 11/13/22   Tereso Newcomer T, [name]  budesonide (PULMICORT) 0.25 MG/2ML nebulizer solution Take 0.25 mg by nebulization daily. 02/13/23   [provider]  cyanocobalamin (VITAMIN B12) 1000 MCG/ML injection Inject  1,000 mcg into the muscle once a week. saturdays    [provider]  DULoxetine (CYMBALTA) 30 MG capsule Take 2 capsules (60 mg total) by mouth every morning. 05/29/23   Rondel Baton, MD  furosemide (LASIX) 20 MG tablet Take 20 mg by mouth daily.    [provider]  GVOKE HYPOPEN 2-PACK 1 MG/0.2ML SOAJ Inject into the skin. 04/03/23   [provider]  hydrALAZINE (APRESOLINE) 25 MG tablet Take 1 tablet (25 mg total) by mouth every 6 (six) hours as needed (SBP>160 or DBP>110). 01/12/23   Rhetta Mura, MD  insulin lispro (HUMALOG) 100 UNIT/ML KwikPen Inject 5 Units into the skin 3 (three) times daily. 05/15/23   [provider]  ketoconazole (NIZORAL) 2 % cream Apply 1 Application topically daily.    [provider]  LANTUS SOLOSTAR 100 UNIT/ML Solostar Pen Inject 40 Units into the skin daily. 02/19/23   [provider]  MYRBETRIQ 25 MG TB24 tablet Take 25 mg by mouth daily. 03/23/23   [provider]  nitroGLYCERIN (NITROSTAT) 0.4 MG SL tablet Place 0.4 mg under the tongue every 5 (five) minutes as needed for chest pain.    [provider]  nystatin (MYCOSTATIN/NYSTOP) powder Apply 1 Application topically 2 (two) times daily. 05/15/23   [provider]  ondansetron (ZOFRAN-ODT) 4 MG disintegrating tablet Take 4 mg by mouth every 8 (eight) hours as needed for nausea or vomiting. 04/08/23  [provider]  pantoprazole (PROTONIX) 40 MG tablet TAKE 1 TABLET BY MOUTH EVERY DAY Patient taking differently: Take 40 mg by mouth in the morning. 11/25/19   [name], MD  QUEtiapine (SEROQUEL) 50 MG tablet Take 50 mg by mouth daily. 02/23/23   [provider]  senna-docusate (SENOKOT-S) 8.6-50 MG tablet Take 1 tablet by mouth 2 (two) times daily as needed for moderate constipation. 01/09/23   Almon Hercules, MD  tamsulosin (FLOMAX) 0.4 MG CAPS capsule Take 1 capsule (0.4 mg total) by mouth daily after  breakfast. Patient taking differently: Take 0.4 mg by mouth daily. 01/02/23   Lorin Glass, MD  tiZANidine (ZANAFLEX) 2 MG tablet Take 2 mg by mouth as needed for muscle spasms.    [provider]      Allergies    Cilostazol, Dulaglutide, Levofloxacin, Liraglutide, and Lisinopril    Review of Systems   Review of Systems  Respiratory:  Positive for shortness of breath.     Physical Exam Updated Vital Signs BP 96/73   Pulse 72   Temp 98.7 F (37.1 C) (Oral)   Resp 16   Ht 5\' 6"  (1.676 m)   Wt 74.8 kg   SpO2 100%   BMI 26.63 kg/m  Physical Exam Vitals and nursing note reviewed.  Constitutional:      General: He is not in acute distress.    Appearance: He is well-developed. He is ill-appearing.  HENT:     Head: Normocephalic and atraumatic.     Mouth/Throat:     Mouth: Mucous membranes are moist.  Eyes:     Extraocular Movements: Extraocular movements intact.  Cardiovascular:     Rate and Rhythm: Normal rate and regular rhythm.     Heart sounds: Normal heart sounds.  Pulmonary:     Effort: Tachypnea present.     Breath sounds: Normal breath sounds.  Abdominal:     Palpations: Abdomen is soft.  Musculoskeletal:        General: Normal range of motion.     Cervical back: Normal range of motion and neck supple.     Right lower leg: No edema.     Left lower leg: No edema.  Skin:    General: Skin is warm and dry.  Neurological:     General: No focal deficit present.     Mental Status: He is alert and oriented to person, place, and time.  Psychiatric:        Mood and Affect: Mood normal.     ED Results / Procedures / Treatments   Labs (all labs ordered are listed, but only abnormal results are displayed) Labs Reviewed  CBC WITH DIFFERENTIAL/PLATELET - Abnormal; Notable for the following components:      Result Value   RDW 15.6 (*)    Abs Immature Granulocytes 0.09 (*)    All other components within normal limits  I-STAT VENOUS BLOOD GAS, ED -  Abnormal; Notable for the following components:   pO2, Ven 118 (*)    Bicarbonate 28.1 (*)    Calcium, Ion 1.12 (*)    All other components within normal limits  BASIC METABOLIC PANEL  BRAIN NATRIURETIC PEPTIDE  TROPONIN I (HIGH SENSITIVITY)    EKG EKG Interpretation Date/Time:  Friday September 11 2023 15:47:50 EST Ventricular Rate:  95 PR Interval:    QRS Duration:  116 QT Interval:  431 QTC Calculation: 462 R Axis:   65  Text Interpretation: Atrial flutter Nonspecific intraventricular conduction  delay Artifact No significant change since last tracing Confirmed by Elayne Snare ([id number]) on 09/11/2023 3:52:07 PM  Radiology No results found.  Procedures Procedures    Medications Ordered in ED Medications - No data to display  ED Course/ Medical Decision Making/ A&P Clinical Course as of 09/11/23 1613  Fri Sep 11, 2023  1605 NIF -24, VC 1.25 L. Not quite low enough necessitating intubation at this time. VBG within normal range, no signs of respiratory failure. Patient signed out to Dr. Andria Meuse pending remainder of labs with plan for likely admission with concern for myasthenia crisis.  [VK]    Clinical Course User Index [VK] Rexford Maus, DO                                 Medical Decision Making This patient presents to the ED with chief complaint(s) of shortness of breath with pertinent past medical history of myasthenia gravis, A-fib on Eliquis, hypertension, CAD status post CABG, CKD which further complicates the presenting complaint. The complaint involves an extensive differential diagnosis and also carries with it a high risk of complications and morbidity.    The differential diagnosis includes myasthenia crisis, ACS, arrhythmia, anemia, pneumonia, pneumothorax, pulmonary edema, pleural effusion  Additional history obtained: Additional history obtained from EMS  Records reviewed previous admission documents  ED Course and Reassessment: On patient's  arrival he was tachypneic but otherwise hemodynamically stable in no acute distress.  He was placed on 4 L nasal cannula but unclear if this was for work of breathing or if he ever had true hypoxia, he is currently satting at 100% and I have started to titrate down his oxygen to 3 L.  The patient is currently controlling his secretions and is tachypneic but in no respiratory distress.  He will have EKG, labs and chest x-ray performed.  I have called respiratory to perform NIF and FVC testing to evaluate for respiratory failure in the setting of myasthenia gravis and he will be closely reassessed.  Independent labs interpretation:  The following labs were independently interpreted: VBG and CBC within normal range, remainder of labs pending  Independent visualization of imaging: - Pending     Amount and/or Complexity of Data Reviewed Labs: ordered. Radiology: ordered.          Final Clinical Impression(s) / ED Diagnoses Final diagnoses:  None    Rx / DC Orders ED Discharge Orders     None         Rexford Maus, DO 09/11/23 1613

## 2023-09-11 NOTE — Assessment & Plan Note (Addendum)
Doesn't appear to be in exacerbation or grossly fluid overloaded today.

## 2023-09-11 NOTE — ED Notes (Signed)
Wife Jaimon Lofgreen [phone] would like an update asap

## 2023-09-11 NOTE — Consult Note (Signed)
NEUROLOGY CONSULT NOTE   Date of service: September 11, 2023 Patient Name: Fernando Hess MRN:  [id number] DOB:  19-Mar-1946 Chief Complaint: "shortness of breath" Requesting Provider: Arletha Pili, DO  History of Present Illness  Fernando Hess is a 77 y.o. male  has a past medical history of Acute renal failure (ARF) ([name]) (09/24/2015), Acute stress disorder (11/13/2021), Atrial premature complexes, CAD (coronary artery disease) of artery bypass graft, CAD (coronary artery disease), native coronary artery (2017), CHF (congestive heart failure) ([name]), CKD (chronic kidney disease), stage III ([name]), Contusion of right knee (03/16/2020), Dyspnea, Elevated troponin, Erectile dysfunction, Esophageal reflux, History of atrial fibrillation, History of kidney stones, History of non-ST elevation myocardial infarction (NSTEMI) (09/24/2015), History of ST elevation myocardial infarction (STEMI) (10/22/2015), Hyperlipidemia, Hypertension, Left ureteral stone, Mild atherosclerosis of both carotid arteries, Nephrolithiasis, OSA (obstructive sleep apnea), Peripheral artery disease, RBBB (right bundle branch block), Renal atrophy, right, Sleep apnea, ST elevation myocardial infarction (STEMI) of inferior wall ([name]) (10/22/2015), Type 2 diabetes mellitus treated with insulin ([name]), 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 who presents with shortness of breath. He states this started yesterday out of nowhere and he was not able to sleep last night because of it.  He denies recent illness or chest pain.  He has not had similar flares in the past of his myasthenia gravis. At this time he is tachypneic with an SpO2 of 100% on 4 L nasal cannula.  He states he is currently feeling very discouraged and anxious about his respiratory status.  He additionally tells me that he is currently needing 2 people to assist him with  ambulation.  He believes his left leg has become weaker than his right which is abnormal for him.  Typically his right leg is weaker with some numbness due to a previous surgery.  Previous myasthenia gravis workup is included antibody testing for acetylcholine receptor antibody, musk, LRP 4 all negative with a negative CT chest. Exacerbations have felt to be related to shortness of breath and difficulty breathing and swallowing but the patient was noted to have failed Mestinon, prednisone and IVIG with plan to start Ultomiris (Ravulizumab, an anti-C5 complement system antibody). He does have an active referral to [facility] and we have additionally offered referrals to Nita Sickle MD at [facility].   History provided by wife also suggests possible functional disorder. She states that in the Summer of 2023, he had his first manifestation of "myasthenia gravis" consisting of his hands "locking onto the steering wheel" of his car preventing him from getting out. Fire Department was called and "every time they pried a finger off, when they tried to get to the next finger, the first one snapped back onto the steering wheel". Per wife, Emergency planning/management officer eventually were able to lift him out of the car at which point he was "frozen in the position he was in when sitting", with his hips and knees flexed and his back straight with arms in front of him as though still seated in the car and gripping the steering wheel, per detailed description provided by his wife. Eventually he was evaluated by his neurologist with negative myasthenia gravis antibodies, but "electrical nerve tests" that showed what she recalls was described to her as findings suggesting or consistent with myasthenia gravis. She states that the only test that showed possible myasthenia gravis was the "electrical nerve test". He has also  had episodes of spasms of his legs > arms which she believes to be due to myasthenia gravis. He "locks up" at  times and also has had spasms of his lower limbs associated with kicking movements that resulted in him injuring one of his small toes and losing a nail a few weeks ago. None of the above resemble typical symptoms of myasthenia gravis.   ROS  Comprehensive ROS performed and pertinent positives documented in HPI   Past History   Past Medical History:  Diagnosis Date   Acute renal failure (ARF) ([name]) 09/24/2015   Acute stress disorder 11/13/2021   Atrial premature complexes    CAD (coronary artery disease) of artery bypass graft    Early occlusion of saphenous vein graft to intermediate and marginal branch in February 2007 following bypass grafting    CAD (coronary artery disease), native coronary artery 2017   hx NSTEMI 09-24-2015  s/p  CABG x5 on 10-02-2015;  post op STEMI inferolateral wall,  SVG OM1 and SVG OM2 occluded, distal OM occlusion the calpruit, treated medically // Myoview 7/21: no ischemia, EF 65, low risk   CHF (congestive heart failure) ([name])    CKD (chronic kidney disease), stage III ([name])    Contusion of right knee 03/16/2020   Dyspnea    Elevated troponin    Erectile dysfunction    Esophageal reflux    History of atrial fibrillation    post op CABG 10-02-2015   History of kidney stones    History of non-ST elevation myocardial infarction (NSTEMI) 09/24/2015   s/p  CABG x5   History of ST elevation myocardial infarction (STEMI) 10/22/2015   inferior wall,  post op CABG 10-02-2015   Hyperlipidemia    Hypertension    Left ureteral stone    Mild atherosclerosis of both carotid arteries    Nephrolithiasis    per CT bilateral non-obstructive calculi   OSA (obstructive sleep apnea)    Peripheral artery disease    LE Arterial US 01/2019: R PTA and ATA occluded; L ATA occluded   RBBB (right bundle branch block)    Renal atrophy, right    Sleep apnea    wears cpap    ST elevation myocardial infarction (STEMI) of inferior wall ([name]) 10/22/2015   Type 2 diabetes  mellitus treated with insulin ([name])    followed by pcp   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) 03/01/2008   Wears glasses     Past Surgical History:  Procedure Laterality Date   APPENDECTOMY  1965   CARDIAC CATHETERIZATION N/A 09/26/2015   Procedure: Left Heart Cath and Coronary Angiography;  Surgeon: Lennette Bihari, MD;  Location: [facility] INVASIVE CV LAB;  Service: Cardiovascular;  Laterality: N/A;   CARDIAC CATHETERIZATION N/A 10/22/2015   Procedure: Left Heart Cath and Coronary Angiography;  Surgeon: Tonny Bollman, MD;  Location: [facility] INVASIVE CV LAB;  Service: Cardiovascular;  Laterality: N/A;   CATARACT EXTRACTION W/ INTRAOCULAR LENS  IMPLANT, BILATERAL  2017   COLONOSCOPY     CORONARY ARTERY BYPASS GRAFT N/A 10/02/2015   Procedure: CORONARY ARTERY BYPASS GRAFTING (CABG) X5 LIMA-LAD; SVG-DIAG; SVG-OM; SVG-PD; SVG-RAMUS TRANSESOPHAGEAL ECHOCARDIOGRAM (TEE) ENDOSCOPIC GREATER SAPHENOUS VEIN  HARVEST BILAT LE;  Surgeon: Kerin Perna, MD;  Location: [facility] OR;  Service: Open Heart Surgery;  Laterality: N/A;   CYSTOSCOPY/URETEROSCOPY/HOLMIUM LASER/STENT PLACEMENT Left 08/10/2018   Procedure: CYSTOSCOPY/URETEROSCOPY/HOLMIUM LASER/STENT PLACEMENT;  Surgeon: Jerilee Field, MD;  Location: [facility];  Service: Urology;  Laterality: Left;   CYSTOSCOPY/URETEROSCOPY/HOLMIUM LASER/STENT PLACEMENT Left 09/10/2018   Procedure: CYSTOSCOPY/URETEROSCOPY/HOLMIUM LASER/STENT EXCHANGE;  Surgeon: Jerilee Field, MD;  Location: [facility] ORS;  Service: Urology;  Laterality: Left;   HIP ARTHROPLASTY Right 12/26/2022   Procedure: RIGHT HEMI HIP ARTHROPLASTY;  Surgeon: Joen Laura, MD;  Location: [facility] OR;  Service: Orthopedics;  Laterality: Right;   LEFT HEART CATHETERIZATION WITH CORONARY ANGIOGRAM N/A 04/13/2014   Procedure: LEFT HEART CATHETERIZATION WITH CORONARY ANGIOGRAM;  Surgeon: Othella Boyer, MD;  Location: [facility]  CATH LAB;  Service: Cardiovascular;  Laterality: N/A;   LEG SURGERY Right age 38   closed reduction leg fracture   NASAL SEPTOPLASTY W/ TURBINOPLASTY Bilateral 08/30/2021   Procedure: NASAL SEPTOPLASTY WITH BILATERAL INFERIOR TURBINATE REDUCTION;  Surgeon: Osborn Coho, MD;  Location: [facility] OR;  Service: ENT;  Laterality: Bilateral;   POLYPECTOMY     RIGHT HEART CATH N/A 06/06/2021   Procedure: RIGHT HEART CATH;  Surgeon: Dolores Patty, MD;  Location: [facility] INVASIVE CV LAB;  Service: Cardiovascular;  Laterality: N/A;   TEE WITHOUT CARDIOVERSION N/A 10/02/2015   Procedure: TRANSESOPHAGEAL ECHOCARDIOGRAM (TEE);  Surgeon: Kerin Perna, MD;  [facility] OR;  Service: Open Heart Surgery;  Laterality: N/A;   URETEROSCOPY WITH HOLMIUM LASER LITHOTRIPSY Bilateral 2004;  2005  dr grapey  @[facility]    VASECTOMY      Family History: Family History  Problem Relation Age of Onset   Heart attack Mother    Heart attack Father    Diabetes Brother    Pancreatic cancer Brother    Diabetes Brother    Colon cancer Neg Hx    Esophageal cancer Neg Hx    Prostate cancer Neg Hx    Rectal cancer Neg Hx    Stomach cancer Neg Hx    Colon polyps Neg Hx     Social History  reports that he has never smoked. He has never used smokeless tobacco. He reports that he does not currently use alcohol. He reports that he does not use drugs.  Allergies  Allergen Reactions   Cilostazol Swelling and Other (See Comments)    Edema    Dulaglutide Nausea And Vomiting and Other (See Comments)    TRULICITY   Levofloxacin Hives, Itching and Rash   Liraglutide Other (See Comments)    Severe fatigue & insomnia   Lisinopril Itching, Rash and Cough    Medications  No current facility-administered medications for this encounter.  Current Outpatient Medications:    acetaminophen (TYLENOL) 500 MG tablet, Take 1,000 mg by mouth every 6 (six) hours as needed for moderate pain., Disp: , Rfl:    Amino Acids-Protein Hydrolys  (PRO-STAT) LIQD, Take 30 mLs by mouth in the morning and at bedtime., Disp: , Rfl:    apixaban (ELIQUIS) 5 MG TABS tablet, Take 1 tablet (5 mg total) by mouth 2 (two) times daily., Disp: 60 tablet, Rfl:    ascorbic acid (VITAMIN C) 500 MG tablet, Take 1 tablet (500 mg total) by mouth daily., Disp: 30 tablet, Rfl: 0   atorvastatin (LIPITOR) 80 MG tablet, TAKE ONE TABLET BY MOUTH EVERY EVENING (Patient taking differently: Take 80 mg by mouth at bedtime.), Disp: 90 tablet, Rfl: 3   budesonide (PULMICORT) 0.25 MG/2ML nebulizer solution, Take 0.25 mg by nebulization daily., Disp: , Rfl:    cyanocobalamin (VITAMIN B12) 1000 MCG/ML injection, Inject 1,000 mcg into the muscle once a week. saturdays, Disp: , Rfl:    DULoxetine (CYMBALTA) 30 MG  capsule, Take 2 capsules (60 mg total) by mouth every morning., Disp: 30 capsule, Rfl: 0   furosemide (LASIX) 20 MG tablet, Take 20 mg by mouth daily., Disp: , Rfl:    GVOKE HYPOPEN 2-PACK 1 MG/0.2ML SOAJ, Inject into the skin., Disp: , Rfl:    hydrALAZINE (APRESOLINE) 25 MG tablet, Take 1 tablet (25 mg total) by mouth every 6 (six) hours as needed (SBP>160 or DBP>110)., Disp: 30 tablet, Rfl: 0   insulin lispro (HUMALOG) 100 UNIT/ML KwikPen, Inject 5 Units into the skin 3 (three) times daily., Disp: , Rfl:    ketoconazole (NIZORAL) 2 % cream, Apply 1 Application topically daily., Disp: , Rfl:    LANTUS SOLOSTAR 100 UNIT/ML Solostar Pen, Inject 40 Units into the skin daily., Disp: , Rfl:    MYRBETRIQ 25 MG TB24 tablet, Take 25 mg by mouth daily., Disp: , Rfl:    nitroGLYCERIN (NITROSTAT) 0.4 MG SL tablet, Place 0.4 mg under the tongue every 5 (five) minutes as needed for chest pain., Disp: , Rfl:    nystatin (MYCOSTATIN/NYSTOP) powder, Apply 1 Application topically 2 (two) times daily., Disp: , Rfl:    ondansetron (ZOFRAN-ODT) 4 MG disintegrating tablet, Take 4 mg by mouth every 8 (eight) hours as needed for nausea or vomiting., Disp: , Rfl:    pantoprazole (PROTONIX)  40 MG tablet, TAKE 1 TABLET BY MOUTH EVERY DAY (Patient taking differently: Take 40 mg by mouth in the morning.), Disp: 90 tablet, Rfl: 2   QUEtiapine (SEROQUEL) 50 MG tablet, Take 50 mg by mouth daily., Disp: , Rfl:    senna-docusate (SENOKOT-S) 8.6-50 MG tablet, Take 1 tablet by mouth 2 (two) times daily as needed for moderate constipation., Disp: , Rfl:    tamsulosin (FLOMAX) 0.4 MG CAPS capsule, Take 1 capsule (0.4 mg total) by mouth daily after breakfast. (Patient taking differently: Take 0.4 mg by mouth daily.), Disp: 30 capsule, Rfl:    tiZANidine (ZANAFLEX) 2 MG tablet, Take 2 mg by mouth as needed for muscle spasms., Disp: , Rfl:   Vitals   Vitals:   09/11/23 1615 09/11/23 1645 09/11/23 1700 09/11/23 1715  BP: 138/86 (!) 148/71 (!) 149/73 (!) 158/89  Pulse: 64 83 72 71  Resp: 17 20    Temp:      TempSrc:      SpO2: 100% 100% 100% 100%  Weight:      Height:        Body mass index is 26.63 kg/m.  Physical Exam   Constitutional: Ill appearing caucasian male  Psych: Affect appropriate to situation.  Eyes: No scleral injection.  HENT: No OP obstruction.  Head: Normocephalic.  Cardiovascular: Normal rate and regular rhythm.  Respiratory: Tachypnea, 100% 4LNC GI: Soft.  No distension. There is no tenderness.  Skin: WDI.   Neurologic Examination   Neuro: Mental Status: Patient is awake, alert, oriented to person, place, month, year, and situation. Patient is able to give a clear and coherent history. No signs of aphasia or neglect Counts to 15 on a single breath Cranial Nerves: II: Visual Fields are full. Pupils are equal, round, and reactive to light.   III,IV, VI: EOMI without ptosis or diplopia.  V: Facial sensation is symmetric to temperature VII: Facial movement is symmetric resting and smiling VIII: Hearing is intact to voice X: Palate elevates symmetrically XI: Shoulder shrug is symmetric. XII: Tongue protrudes midline without atrophy or fasciculations.   Motor: Tone is normal. Bulk is normal.  RUE 4/5 LUE 4/5 RLE  4/5 LLE 4/5  -he feels that his left leg is weaker than his right at this time Neck flexion 5/5 Sensory: Sensation is symmetric to light touch and temperature in the arms and legs. Cerebellar: FNF and HKS are intact bilaterally  Labs/Imaging/Neurodiagnostic studies   CBC:  Recent Labs  Lab 09/20/2023 1524 09/20/2023 1609  WBC 8.4  --   NEUTROABS 5.4  --   HGB 13.5 14.3  HCT 43.3 42.0  MCV 92.3  --   PLT 188  --     Basic Metabolic Panel:  Lab Results  Component Value Date   NA 140 09/20/23   K 4.3 09/20/23   CO2 26 2023/09/20   GLUCOSE 162 (H) 09/20/2023   BUN 21 September 20, 2023   CREATININE 1.92 (H) 2023-09-20   CALCIUM 8.8 (L) Sep 20, 2023   GFRNONAA 35 (L) 2023/09/20   GFRAA 39 (L) 05/22/2020    Lipid Panel:  Lab Results  Component Value Date   LDLCALC 66 09/18/2022    HgbA1c:  Lab Results  Component Value Date   HGBA1C 10.9 (H) 02/05/2023    Urine Drug Screen:     Component Value Date/Time   LABOPIA NONE DETECTED 01/07/2023 2203   COCAINSCRNUR NONE DETECTED 01/07/2023 2203   LABBENZ NONE DETECTED 01/07/2023 2203   AMPHETMU NONE DETECTED 01/07/2023 2203   THCU NONE DETECTED 01/07/2023 2203   LABBARB NONE DETECTED 01/07/2023 2203     Alcohol Level     Component Value Date/Time   ETH <10 01/07/2023 1638    INR  Lab Results  Component Value Date   INR 1.5 (H) 04/15/2023    APTT  Lab Results  Component Value Date   APTT 31 09/17/2022    Chest Xray Low lung volumes. Mild cardiomegaly with findings suggestive of pulmonary vascular congestion.  ASSESSMENT  Fernando Hess is a 77 y.o. male  has a past medical history of Acute renal failure (ARF) ([name]) (09/24/2015), Acute stress disorder (11/13/2021), Atrial premature complexes, CAD (coronary artery disease) of artery bypass graft, CAD (coronary artery disease), native coronary artery (2017), CHF (congestive heart failure) ([name]), CKD  (chronic kidney disease), stage III ([name]), Contusion of right knee (03/16/2020), Dyspnea, Elevated troponin, Erectile dysfunction, Esophageal reflux, History of atrial fibrillation, History of kidney stones, History of non-ST elevation myocardial infarction (NSTEMI) (09/24/2015), History of ST elevation myocardial infarction (STEMI) (10/22/2015), Hyperlipidemia, Hypertension, Left ureteral stone, Mild atherosclerosis of both carotid arteries, Nephrolithiasis, OSA (obstructive sleep apnea), Peripheral artery disease, RBBB (right bundle branch block), Renal atrophy, right, Sleep apnea, ST elevation myocardial infarction (STEMI) of inferior wall ([name]) (10/22/2015), Type 2 diabetes mellitus treated with insulin ([name]), 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  He has an unclear diagnosis of myasthenia gravis and is currently managed at a outpatient Neurology clinic in  [facility] (Dr. Estella Husk, [phone]).  He has failed multiple modalities of treatment including Mestinon and IVIG.  On exam his neck strength is good and he is able to count to approximately 15 on a single breath.  He has noticed some left leg weakness and has needed some more assistance with ambulation.  - Exam reveals mild weakness of the extremities at 4 to 4+/5 but no bulbar weakness, no ocular motility deficits, no ptosis and no evidence of shortness of breath. He does complain of breathing problems which occurred in conjunction with anxious affect at the time of NP exam,  which no longer was present when evaluated by Neurology attending. Reflexes are brisk x 4. No cogwheeling noted. There is mild tremor with FNF.  - Overall impression:  - Episodic muscle spasms of uncertain etiology. An episode described in detail by wife as occurring during the Summer of 2023 involving "freezing" of his body in a  seated position (see HPI) has more of a resemblance to a psychogenic episode than an actual dystonic spell and bears no resemblance to MG. Other episodes described by wife as being due to "weakness" (see HPI) also do not suggest MG.  - Subjective SOB. Suspect possible intrinsic lung pathology rather than diaphragmatic weakness. NIFs here have been as low as -24 and as high as -40 with FVC of 1.25.   RECOMMENDATIONS  - Work up for possible intrinsic lung disease leading to shortness of breath - No indication for IVIG at this time.  - No indication for Mestinon at this time - Respiratory Therapy to track FVC and NIF q6h - Will need outpatient Neurology follow up for second opinion on myasthenia gravis. I would recommend a Single-Fiber EMG study which is highly specific for MG. This is only available at a few centers. It has been available at [facility] in the past but I am not sure if the practitioner trained in this procedure at [facility] (Dr. Clarisa Kindred) is still in practice. May be of benefit to call [facility] on Monday to determine if Single-Fiber EMG is still available there.  ______________________________________________________________________  I have seen and examined the patient. I have formulated the assessment and recommendations. 77 year old male presenting with shortness of breath and symptomatic limb weakness that he attributes to myasthenia gravis. Exam findings and history are not convincing for a NMJ disorder. Primary complaints are of muscle spasms and dyskinetic-like movements that are themselves atypical and do suggest possible psychogenic symptoms. Recommendations as above.  Electronically signed: Dr. Caryl Pina

## 2023-09-11 NOTE — Assessment & Plan Note (Signed)
Continue eliquis  ?

## 2023-09-11 NOTE — ED Triage Notes (Signed)
Pt here from home with c/o sob worse while laying down , pt on 4 liters n/c upon arrival

## 2023-09-11 NOTE — ED Provider Notes (Signed)
  Physical Exam  BP 138/86   Pulse 64   Temp 98.7 F (37.1 C) (Oral)   Resp 17   Ht 5\' 6"  (1.676 m)   Wt 74.8 kg   SpO2 100%   BMI 26.63 kg/m   Physical Exam  Procedures  Procedures  ED Course / MDM   Clinical Course as of 09/11/23 1716  Fri Sep 11, 2023  1605 NIF -24, VC 1.25 L. Not quite low enough necessitating intubation at this time. VBG within normal range, no signs of respiratory failure. Patient signed out to Dr. Andria Meuse pending remainder of labs with plan for likely admission with concern for myasthenia crisis.  [VK]    Clinical Course User Index [VK] Rexford Maus, DO   Medical Decision Making Amount and/or Complexity of Data Reviewed Labs: ordered. Radiology: ordered.   I, Rosana Berger, assumed care for this patient.  In brief 77 year old male history of myasthenia gravis who presents to the emergency department with shortness of breath.  Patient was a signout pending labs and likely admission.  My evaluation of the patient, he is able to speak in several word sentences, however is taking shallow breaths.  Patient had respiratory testing done which did not show severe myasthenic crisis, however patient symptoms have worsened over the last few days, and may continue to do so.  Will admit patient to hospitalist for likely myasthenic crisis.       Anders Simmonds T, DO 09/11/23 1717

## 2023-09-11 NOTE — Assessment & Plan Note (Signed)
Cont eliquis Oddly, doesn't look like he's on a statin, but statin not on his allergies list?

## 2023-09-12 ENCOUNTER — Observation Stay (HOSPITAL_BASED_OUTPATIENT_CLINIC_OR_DEPARTMENT_OTHER): Payer: Medicare Other

## 2023-09-12 ENCOUNTER — Observation Stay (HOSPITAL_COMMUNITY): Payer: Medicare Other

## 2023-09-12 DIAGNOSIS — R0609 Other forms of dyspnea: Secondary | ICD-10-CM | POA: Diagnosis not present

## 2023-09-12 DIAGNOSIS — R531 Weakness: Secondary | ICD-10-CM | POA: Diagnosis not present

## 2023-09-12 LAB — BASIC METABOLIC PANEL
Anion gap: 8 (ref 5–15)
BUN: 21 mg/dL (ref 8–23)
CO2: 27 mmol/L (ref 22–32)
Calcium: 8.6 mg/dL — ABNORMAL LOW (ref 8.9–10.3)
Chloride: 107 mmol/L (ref 98–111)
Creatinine, Ser: 1.95 mg/dL — ABNORMAL HIGH (ref 0.61–1.24)
GFR, Estimated: 34 mL/min — ABNORMAL LOW (ref >60)
Glucose, Bld: 118 mg/dL — ABNORMAL HIGH (ref 70–99)
Potassium: 4.2 mmol/L (ref 3.5–5.1)
Sodium: 142 mmol/L (ref 135–145)

## 2023-09-12 LAB — CBG MONITORING, ED
Glucose-Capillary: 162 mg/dL — ABNORMAL HIGH (ref 70–99)
Glucose-Capillary: 116 mg/dL — ABNORMAL HIGH (ref 70–99)
Glucose-Capillary: 129 mg/dL — ABNORMAL HIGH (ref 70–99)

## 2023-09-12 LAB — ECHOCARDIOGRAM COMPLETE
AR max vel: 0.87 cm2
AV Area VTI: 0.95 cm2
AV Area mean vel: 0.83 cm2
AV Mean grad: 9 mm[Hg]
AV Peak grad: 16.8 mm[Hg]
Ao pk vel: 2.05 m/s
Height: 66 in
S' Lateral: 3.3 cm
Weight: 2640 [oz_av]

## 2023-09-12 LAB — CBC
HCT: 44.6 % (ref 39.0–52.0)
Hemoglobin: 14 g/dL (ref 13.0–17.0)
MCH: 28.9 pg (ref 26.0–34.0)
MCHC: 31.4 g/dL (ref 30.0–36.0)
MCV: 92 fL (ref 80.0–100.0)
Platelets: 168 10*3/uL (ref 150–400)
RBC: 4.85 MIL/uL (ref 4.22–5.81)
RDW: 15.7 % — ABNORMAL HIGH (ref 11.5–15.5)
WBC: 9.6 10*3/uL (ref 4.0–10.5)
nRBC: 0 % (ref 0.0–0.2)

## 2023-09-12 LAB — PROCALCITONIN: Procalcitonin: <0.1 ng/mL

## 2023-09-12 LAB — TSH: TSH: 1.556 u[IU]/mL (ref 0.350–4.500)

## 2023-09-12 MED ORDER — MELATONIN 5 MG PO TABS
5.0000 mg | ORAL_TABLET | Freq: Every evening | ORAL | Status: DC | PRN
  Administered 2023-09-12 – 2023-09-16 (×4): 5 mg via ORAL
  Filled 2023-09-12 (×4): qty 1

## 2023-09-12 MED ORDER — FUROSEMIDE 20 MG PO TABS
20.0000 mg | ORAL_TABLET | Freq: Once | ORAL | Status: AC
  Administered 2023-09-12: 20 mg via ORAL
  Filled 2023-09-12: qty 1

## 2023-09-12 NOTE — ED Notes (Signed)
Wife is requesting patient receive a bath when he goes upstairs.

## 2023-09-12 NOTE — Progress Notes (Signed)
Pt not available at this time for NIF and VC testing. RT will return at next scheduled time

## 2023-09-12 NOTE — Assessment & Plan Note (Signed)
Cont CPAP QHS 

## 2023-09-12 NOTE — Progress Notes (Signed)
Pt performed with good effort:  NIF >-40  VC 1.28L

## 2023-09-12 NOTE — Progress Notes (Signed)
RT NOTE:   NIF: -40 with great patient effort.

## 2023-09-12 NOTE — Care Management (Addendum)
Transition of Care [facility]) - Inpatient Brief Assessment   Patient Details  Name: Fernando Hess MRN: [id number] Date of Birth: 01-15-1946  [facility] CM/SW Contact:    Fernando Pares, RN Phone Number: 09/12/2023, 2:03 PM   Clinical Narrative:  Called patient regarding OBV status and for DC planning. Patient has MG, last had [facility] per [facility] in February of this year for [facility] services.  Patient unable to talk at this time, did not give OBV ( [facility])  notice 1500 Spoke to Fernando Hess regarding  discharge needs and medical equipment. Patient has been in and out of Nursing facilities. Likes [facility] and [facility] the best. He has apparently signed out of some facilities, due to extra fees. He is currently active with [facility] for Nursing, PT and OT. [name] His wife just had back surgery recently. She has trouble assisting him at times, but feels Nursing facilities do not give adequete care. She states they have been approved for an elecrtical bed from insurance, but it needs to be low, his legs are short and he has trouble getting from wheelchair to bed. She does not want one that looks too much like a hospital bed. She will get in touch with [facility] as she already has approval notice from [employer].     TOC will follow fo needs  Transition of Care Asessment: Insurance and Status: Insurance coverage has been reviewed Patient has primary care physician: Yes Home environment has been reviewed: Home with spouse Prior level of function:: Needs assist Prior/Current Home Services: No current home services Social Drivers of Health Review: SDOH reviewed no interventions necessary Readmission risk has been reviewed: Yes Transition of care needs: transition of care needs identified, TOC will continue to follow

## 2023-09-12 NOTE — Progress Notes (Signed)
  Echocardiogram 2D Echocardiogram has been performed.  Fernando Hess 09/12/2023, 11:50 AM

## 2023-09-12 NOTE — Care Management Obs Status (Signed)
MEDICARE OBSERVATION STATUS NOTIFICATION   Patient Details  Name: Fernando Hess MRN: [id number] Date of Birth: 09-29-1945   Medicare Observation Status Notification Given:  Yes    Lockie Pares, RN 09/12/2023, 3:13 PM

## 2023-09-12 NOTE — Progress Notes (Signed)
Pt preformed with good effort: NIF>-40 VC 1.16L

## 2023-09-12 NOTE — Progress Notes (Signed)
   09/12/23 2300  BiPAP/CPAP/SIPAP  Reason BIPAP/CPAP not in use Non-compliant (Pt states he does not wear CPAP)

## 2023-09-12 NOTE — Assessment & Plan Note (Signed)
Creat 1.9 today appears to be about his baseline.

## 2023-09-12 NOTE — Progress Notes (Signed)
Pt performed with good effort: NIF >-40 VC 1.53L

## 2023-09-12 NOTE — Progress Notes (Signed)
Physical Therapy Evaluation Patient Details Name: Fernando Hess MRN: [id number] DOB: 01-14-1946 Today's Date: 09/12/2023  History of Present Illness  77 y.o. male admitted to the hospital on 09/11/23 for SOB and generalized weakness.  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 Pt with significant PMH of DM2, CKD III, CAD, CHF, dementia, PAF, ARF, NSTEMI, STEMI, CABG, R hip arthroplasty.  Clinical Impression  Pt is very weak, shaky on his feet and at very high risk of falling.  We were only able to stand EOB and take a few difficult side steps with the RW.  He is not currently physically safe to return home with his elderly wife who cannot physically assist him.  In his current state, he would be most appropriate for SNF for rehab, but will likely refuse this option as he has had a bad experience in the past.  PT to follow acutely for deficits listed below.       If plan is discharge home, recommend the following: Two people to help with walking and/or transfers;A lot of help with bathing/dressing/bathroom;Assist for transportation;Help with stairs or ramp for entrance   Can travel by private vehicle   No    Equipment Recommendations None recommended by PT  Recommendations for Other Services  OT consult    Functional Status Assessment Patient has had a recent decline in their functional status and demonstrates the ability to make significant improvements in function in a reasonable and predictable amount of time.     Precautions / Restrictions Precautions Precautions: Fall Restrictions Weight Bearing Restrictions Per Provider Order: No      Mobility  Bed Mobility Overal bed mobility: Needs Assistance Bed Mobility: Supine to Sit, Sit to Supine     Supine to sit: Mod assist Sit to supine: Mod assist   General bed mobility comments: Mod assist to support trunk to come to sitting and assist both legs to return to supine.    Transfers Overall transfer level: Needs  assistance Equipment used: Rollator (4 wheels) Transfers: Sit to/from Stand Sit to Stand: Mod assist           General transfer comment: Mod assist to come to standing EOB with rollator.    Ambulation/Gait Ambulation/Gait assistance: Mod assist Gait Distance (Feet): 3 Feet Assistive device: Rollator (4 wheels) Gait Pattern/deviations: Step-to pattern       General Gait Details: Pt took 3-4 side steps up the [facility] with rollator mod assist to prevent falls over shaky legs and pt with significant balance and strength issues in standing. He also has issues controlling the rollator, but reports he owns a RW.  Stairs            Wheelchair Mobility     Tilt Bed    Modified Rankin (Stroke Patients Only)       Balance Overall balance assessment: Needs assistance Sitting-balance support: Feet unsupported, Bilateral upper extremity supported Sitting balance-Leahy Scale: Poor Sitting balance - Comments: needs min assist in sitting with feet dangling for balance.                                     Pertinent Vitals/Pain Pain Assessment Pain Assessment: No/denies pain    Home Living Family/patient expects to be discharged to:: Private residence Living Arrangements: Spouse/significant other Available Help at Discharge: Family;Personal care attendant;Available PRN/intermittently Type of Home: House Home Access: Ramped entrance;Stairs to enter Entrance Stairs-Rails: Left Entrance  Stairs-Number of Steps: 5   Home Layout: Two level;Able to live on main level with bedroom/bathroom Home Equipment: Rollator (4 wheels);Shower seat;Grab bars - tub/shower;BSC/3in1;Wheelchair - manual      Prior Function Prior Level of Function : Needs assist       Physical Assist : Mobility (physical) Mobility (physical): Bed mobility;Transfers;Gait;Stairs   Mobility Comments: pt reports using a rollator at baseline, with h/o recent falls ADLs Comments: pt has an aide that  comes 3xs/wk for 2-3 hours helping mostly with housework, cleaning, cooking, and errands.     Extremity/Trunk Assessment   Upper Extremity Assessment Upper Extremity Assessment: Generalized weakness    Lower Extremity Assessment Lower Extremity Assessment: Generalized weakness (left leg mildly weaker than R and pt reports R leg is normally his weaker side.  Strength generally 4/5)    Cervical / Trunk Assessment Cervical / Trunk Assessment: Normal  Communication   Communication Communication: Difficulty communicating thoughts/reduced clarity of speech;Other (comment) (mumbles, difficult to understand and time mixed with baseline dementia)  Cognition   Behavior During Therapy: Anxious Overall Cognitive Status: History of cognitive impairments - at baseline                                          General Comments      Exercises     Assessment/Plan    PT Assessment Patient needs continued PT services  PT Problem List Decreased strength;Decreased activity tolerance;Decreased balance;Decreased coordination;Decreased mobility;Decreased cognition;Decreased knowledge of use of DME;Impaired sensation       PT Treatment Interventions Gait training;DME instruction;Stair training;Functional mobility training;Therapeutic activities;Balance training;Therapeutic exercise;Neuromuscular re-education;Cognitive remediation;Patient/family education;Wheelchair mobility training    PT Goals (Current goals can be found in the Care Plan section)  Acute Rehab PT Goals Patient Stated Goal: to get strong enough to go home as he has had a bad experience at SNF in the past PT Goal Formulation: With patient Time For Goal Achievement: 09/26/23 Potential to Achieve Goals: Good    Frequency Min 1X/week     Co-evaluation               AM-PAC PT "6 Clicks" Mobility  Outcome Measure Help needed turning from your back to your side while in a flat bed without using bedrails?: A  Lot Help needed moving from lying on your back to sitting on the side of a flat bed without using bedrails?: A Lot Help needed moving to and from a bed to a chair (including a wheelchair)?: A Lot Help needed standing up from a chair using your arms (e.g., wheelchair or bedside chair)?: A Lot Help needed to walk in hospital room?: Total Help needed climbing 3-5 steps with a railing? : Total 6 Click Score: 10    End of Session Equipment Utilized During Treatment: Gait belt;Oxygen Activity Tolerance: Patient limited by fatigue Patient left: in bed;with call bell/phone within reach Nurse Communication: Mobility status;Other (comment) (elevated BPs throughout session 170s-180s/80s-90s) PT Visit Diagnosis: Muscle weakness (generalized) (M62.81);Difficulty in walking, not elsewhere classified (R26.2)    Time: 6045-4098 PT Time Calculation (min) (ACUTE ONLY): 37 min   Charges:   PT Evaluation $PT Eval Moderate Complexity: 1 Mod PT Treatments $Therapeutic Activity: 8-22 mins PT General Charges $$ ACUTE PT VISIT: 1 Visit         Corinna Capra, PT, DPT  Acute Rehabilitation Secure chat is best for contact #[phone] office  09/12/2023, 1:45 PM

## 2023-09-12 NOTE — Assessment & Plan Note (Signed)
Cont Semglee, tradjenda Sensitive SSI AC

## 2023-09-12 NOTE — Progress Notes (Signed)
PROGRESS NOTE  LORA KNETTER ZOX:[id number] DOB: 1946-06-26 DOA: 09/11/2023 PCP: [name], MD  Brief History   The patient is a 77 yr old man who presented to [facility] ED on 09/11/2023 with complaint of worsening shortness of breath x 2 days. He has a past medical history significant for CKD 3b, CAD s/p CABG, CHF, OSA, DM2 and myasthenia gravis. The patient feels that this is similar to previous episodes of exacerbation of myasthenia gravis. The patient has been worked up as outpatient by Dr. Otelia Limes who does not feel that the patient's work up for [facility] is consistent with myasthenia gravis. He has failed treatment with Mestinon, prednisone, and IVIG. He has a plan to start Ultomiris and an active referral to [facility] Neurology and Nita Sickle MD at [facility]. There is some evidence that this is a functional disorder and not MG.He has no bulbar weakness on exam. He has suggested that the patient is suffering from an intrinsic lung pathology. NIF's in the ED have ranged from -24 to -40. FVC is 1.25.  The patient has been admitted. CT chest has demonstrated patchy left upper lobe ground-grass airspace opacities suggesting infection or inflammation. There is a recommendation for follow up CT in 3 months to exclude an underlying malignancy. There is a loculated right pleural effusion, cardiomegaly, and an aortopulmonary lymph node measuring up to 1 cm. 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 Annual imaging by CTA or MRA is recommended.   The patient has been admitted to a progressive bed. NIF's will be followed. The patient will receive lasix 20 mg daily and I's and O's will be followed. Neurology is following. Echocardiogram was obtained and has demonstrated mild LVH, Normal LV and RV systolic function. There are not LV regional wall motion abnormalities. The left atrial size is moderately dilated. There is mild to moderate aortic stenosis. Procalcitonin has been ordered.  The  patient states that today he is feeling the same.  A & P   Generalized weakness Generalized weakness + SOB. Seen by neuro due to concern for myasthenic crisis.  Neuro is actually less impressed for myasthenic crisis (see their consult note). Defer to neuro regarding the MG crisis question and if he needs treatment At this point they do not recommend IVIG nor mestinon (again, please see their consult note for details). NIF / TV Q4H ordered In mean time (at neuro recommendation) ill order some work up to start looking for other possible causes of his SOB / weakness: CT chest w/o contrast to look for intrinsic lung dz PE seems less likely given that he is on chronic eliquis for PAF. Also has CKD 3b so avoiding contrast use 2d echo to evaluate heart dz Check TSH Tele monitor Cont pulse ox PT/OT Cautious diuresis with lasix 20 mg daily. Monitor I's and O's.  Check procalcitonin   Paroxysmal atrial fibrillation ([name]) Continue eliquis   Dementia without behavioral disturbance ([name]) Cont bupropion Cont Aricept Cont Cymbalta Cont seroquel   Chronic diastolic CHF (congestive heart failure) ([name]) Attempt to diurese gently with lasix 20 mg daily.   Coronary artery disease involving native coronary artery of native heart without angina pectoris Cont eliquis Oddly, doesn't look like he's on a statin, but statin not on his allergies list? Will defer to PCP.   CKD stage 3b, GFR 30-44 ml/min ([name]) Creat 1.9 today appears to be about his baseline.   OSA on CPAP Cont CPAP QHS   DM2 (  diabetes mellitus, type 2) ([name]) Cont Semglee, tradjenda Sensitive SSI AC   I have seen and examined this patient myself. I have spent 34 minutes in his evaluation and care.      Advance Care Planning:   Code Status: Full Code   Consults: Neurology   Family Communication: No family inroom  DVT prophylaxis: Eliquis Code Status: Ful Code Family Communication: None available Disposition Plan: tbd     Izsak Meir, DO [facility] Direct contact: see [email or web address]  7PM-7AM contact night coverage as above 09/12/2023, 2:14 PM  LOS: 0 days   Consultants  Neurology  Procedures  None  Antibiotics  None  Interval History/Subjective  The patient states that he feels about the same as when he came in. No new complaints.  Objective   Vitals:  Vitals:   09/12/23 1108 09/12/23 1215  BP:  (!) 188/98  Pulse:  77  Resp:  20  Temp: 98.2 F (36.8 C)   SpO2:  99%    Exam:  Constitutional:  The patient is awake, alert, and oriented x 3. No acute distress. Respiratory:  No increased work of breathing. No wheezes, rales, or rhonchi No tactile fremitus Cardiovascular:  Regular rate and rhythm No murmurs, ectopy, or gallups. No lateral PMI. No thrills. Abdomen:  Abdomen is soft, non-tender, non-distended No hernias, masses, or organomegaly Normoactive bowel sounds.  Musculoskeletal:  No cyanosis, clubbing, or edema Skin:  No rashes, lesions, ulcers palpation of skin: no induration or nodules Neurologic:  CN 2-12 intact Sensation all 4 extremities intact Psychiatric:  Mental status Mood, affect appropriate Orientation to person, place, time  judgment and insight appear intact   I have personally reviewed the following:   Today's Data   Vitals:   09/12/23 1215 09/12/23 1437  BP: (!) 188/98 115/83  Pulse: 77 60  Resp: 20 16  Temp:  97.9 F (36.6 C)  SpO2: 99% 98%     Lab Data  CBC    Component Value Date/Time   WBC 9.6 09/12/2023 0313   RBC 4.85 09/12/2023 0313   HGB 14.0 09/12/2023 0313   HGB 15.8 05/31/2021 1438   HCT 44.6 09/12/2023 0313   HCT 45.2 05/31/2021 1438   PLT 168 09/12/2023 0313   PLT 251 05/31/2021 1438   MCV 92.0 09/12/2023 0313   MCV 87 05/31/2021 1438   MCH 28.9 09/12/2023 0313   MCHC 31.4 09/12/2023 0313   RDW 15.7 (H) 09/12/2023 0313   RDW 13.2 05/31/2021 1438   LYMPHSABS 1.6 09/11/2023 1524   MONOABS 0.9  09/11/2023 1524   EOSABS 0.3 09/11/2023 1524   BASOSABS 0.1 09/11/2023 1524      Latest Ref Rng & Units 09/12/2023    3:13 AM 09/11/2023    4:09 PM 09/11/2023    3:24 PM  BMP  Glucose 70 - 99 mg/dL 161   096   BUN 8 - 23 mg/dL 21   21   Creatinine 0.45 - 1.24 mg/dL 4.09   8.11   Sodium 914 - 145 mmol/L 142  140  140   Potassium 3.5 - 5.1 mmol/L 4.2  4.3  4.4   Chloride 98 - 111 mmol/L 107   106   CO2 22 - 32 mmol/L 27   26   Calcium 8.9 - 10.3 mg/dL 8.6   8.8      Micro Data   Results for orders placed or performed during the hospital encounter of 02/26/23  SARS Coronavirus 2 by RT PCR (hospital  order, performed in [facility] hospital lab) *cepheid single result test* Anterior Nasal Swab     Status: Abnormal   Collection Time: 02/26/23  4:02 PM   Specimen: Anterior Nasal Swab  Result Value Ref Range Status   SARS Coronavirus 2 by RT PCR POSITIVE (A) NEGATIVE Final    Comment: Performed at [facility], [address] [place], [name] [address]     Imaging  CT chest CXR  Cardiology Data  Echocardiogram  Scheduled Meds:  apixaban  5 mg Oral BID   budesonide  0.25 mg Nebulization Daily   buPROPion  300 mg Oral Daily   donepezil  10 mg Oral QHS   DULoxetine  20 mg Oral Daily   insulin aspart  0-9 Units Subcutaneous TID WC   insulin glargine-yfgn  36 Units Subcutaneous Daily   linagliptin  5 mg Oral Daily   mirabegron ER  25 mg Oral Daily   pantoprazole  40 mg Oral q AM   QUEtiapine  50 mg Oral Daily   tamsulosin  0.4 mg Oral QPC breakfast   Continuous Infusions:  Principal Problem:   Generalized weakness Active Problems:   Paroxysmal atrial fibrillation ([name])   Dementia without behavioral disturbance ([name])   Coronary artery disease involving native coronary artery of native heart without angina pectoris   Chronic diastolic CHF (congestive heart failure) ([name])   CKD stage 3b, GFR 30-44 ml/min ([name])   OSA on CPAP   DM2 (diabetes mellitus, type 2)  ([name])   LOS: 0 days

## 2023-09-13 DIAGNOSIS — R531 Weakness: Secondary | ICD-10-CM | POA: Diagnosis not present

## 2023-09-13 LAB — BASIC METABOLIC PANEL
Anion gap: 9 (ref 5–15)
BUN: 24 mg/dL — ABNORMAL HIGH (ref 8–23)
CO2: 27 mmol/L (ref 22–32)
Calcium: 8.7 mg/dL — ABNORMAL LOW (ref 8.9–10.3)
Chloride: 106 mmol/L (ref 98–111)
Creatinine, Ser: 2.05 mg/dL — ABNORMAL HIGH (ref 0.61–1.24)
GFR, Estimated: 32 mL/min — ABNORMAL LOW (ref >60)
Glucose, Bld: 79 mg/dL (ref 70–99)
Potassium: 4.3 mmol/L (ref 3.5–5.1)
Sodium: 142 mmol/L (ref 135–145)

## 2023-09-13 LAB — CBC WITH DIFFERENTIAL/PLATELET
Abs Immature Granulocytes: 0.08 10*3/uL — ABNORMAL HIGH (ref 0.00–0.07)
Basophils Absolute: 0.1 10*3/uL (ref 0.0–0.1)
Basophils Relative: 1 %
Eosinophils Absolute: 0.2 10*3/uL (ref 0.0–0.5)
Eosinophils Relative: 2 %
HCT: 43.4 % (ref 39.0–52.0)
Hemoglobin: 13.7 g/dL (ref 13.0–17.0)
Immature Granulocytes: 1 %
Lymphocytes Relative: 18 %
Lymphs Abs: 1.6 10*3/uL (ref 0.7–4.0)
MCH: 28.7 pg (ref 26.0–34.0)
MCHC: 31.6 g/dL (ref 30.0–36.0)
MCV: 90.8 fL (ref 80.0–100.0)
Monocytes Absolute: 1.1 10*3/uL — ABNORMAL HIGH (ref 0.1–1.0)
Monocytes Relative: 12 %
Neutro Abs: 5.9 10*3/uL (ref 1.7–7.7)
Neutrophils Relative %: 66 %
Platelets: 168 10*3/uL (ref 150–400)
RBC: 4.78 MIL/uL (ref 4.22–5.81)
RDW: 15.8 % — ABNORMAL HIGH (ref 11.5–15.5)
WBC: 9.1 10*3/uL (ref 4.0–10.5)
nRBC: 0 % (ref 0.0–0.2)

## 2023-09-13 LAB — GLUCOSE, CAPILLARY
Glucose-Capillary: 79 mg/dL (ref 70–99)
Glucose-Capillary: 71 mg/dL (ref 70–99)
Glucose-Capillary: 127 mg/dL — ABNORMAL HIGH (ref 70–99)
Glucose-Capillary: 79 mg/dL (ref 70–99)

## 2023-09-13 MED ORDER — LACTULOSE 10 GM/15ML PO SOLN
20.0000 g | Freq: Every day | ORAL | Status: DC
  Administered 2023-09-14 – 2023-09-17 (×4): 20 g via ORAL
  Filled 2023-09-13 (×5): qty 30

## 2023-09-13 MED ORDER — ENSURE ENLIVE PO LIQD
237.0000 mL | Freq: Two times a day (BID) | ORAL | Status: DC
Start: 2023-09-13 — End: 2023-09-17
  Administered 2023-09-14 – 2023-09-17 (×6): 237 mL via ORAL

## 2023-09-13 NOTE — Progress Notes (Signed)
Mobilized patient from bed to chair, 2 person assist with walker. Patient denied any dizziness, but was very shaky, and had to sit back on bed while staff moved chair closer to the bed. Patient said that he felt his lower extremities were weak. Patient seated in chair comfortably awaiting meal, no other complaints.

## 2023-09-13 NOTE — Progress Notes (Signed)
NIF - 42 good effort

## 2023-09-13 NOTE — Progress Notes (Signed)
Pt had NIF of greater than -40 twice with great pt effort

## 2023-09-13 NOTE — Progress Notes (Signed)
NIF not done at this time due to pt not wanting to be woke up at this hour. Will do next scheduled at 0800.

## 2023-09-13 NOTE — Plan of Care (Signed)

## 2023-09-13 NOTE — Progress Notes (Signed)
NIF of -40 plus with great pt effort

## 2023-09-13 NOTE — Progress Notes (Signed)
PROGRESS NOTE  LUM PFAB BJY:[id number] DOB: July 06, 1946 DOA: 09/11/2023 PCP: [name], MD  Brief History   The patient is a 77 yr old man who presented to [facility] ED on 09/11/2023 with complaint of worsening shortness of breath x 2 days. He has a past medical history significant for CKD 3b, CAD s/p CABG, CHF, OSA, DM2 and myasthenia gravis. The patient feels that this is similar to previous episodes of exacerbation of myasthenia gravis. The patient has been worked up as outpatient by Dr. Otelia Limes who does not feel that the patient's work up for [facility] is consistent with myasthenia gravis. He has failed treatment with Mestinon, prednisone, and IVIG. He has a plan to start Ultomiris and an active referral to [facility] Neurology and Nita Sickle MD at [facility]. There is some evidence that this is a functional disorder and not MG.He has no bulbar weakness on exam. He has suggested that the patient is suffering from an intrinsic lung pathology. NIF's in the ED have ranged from -24 to -40. FVC is 1.25.  The patient has been admitted. CT chest has demonstrated patchy left upper lobe ground-grass airspace opacities suggesting infection or inflammation. There is a recommendation for follow up CT in 3 months to exclude an underlying malignancy. There is a loculated right pleural effusion, cardiomegaly, and an aortopulmonary lymph node measuring up to 1 cm. 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 Annual imaging by CTA or MRA is recommended.   The patient has been admitted to a progressive bed. NIF's will be followed. The patient will receive lasix 20 mg daily and I's and O's will be followed. Neurology is following. Echocardiogram was obtained and has demonstrated mild LVH, Normal LV and RV systolic function. There are not LV regional wall motion abnormalities. The left atrial size is moderately dilated. There is mild to moderate aortic stenosis. Procalcitonin was negative.  Will attempt  to wean O2 to off. If patient is unable to wean off of O2 may consider cardiology consult to rule out dyspnea as an anginal equivalent.  The patient states that today he is feeling the same.  A & P   Generalized weakness Generalized weakness + SOB. Seen by neuro due to concern for myasthenic crisis.  Neuro is actually less impressed for myasthenic crisis (see their consult note). Defer to neuro regarding the MG crisis question and if he needs treatment At this point they do not recommend IVIG nor mestinon (again, please see their consult note for details). NIF / TVC Q4H ordered. Both have been within normal range since admission.  In mean time (at neuro recommendation) ill order some work up to start looking for other possible causes of his SOB / weakness: CT chest w/o contrast to look for intrinsic lung dz PE seems less likely given that he is on chronic eliquis for PAF. Also has CKD 3b so avoiding contrast use 2d echo to evaluate heart dz Check TSH Tele monitor Cont pulse ox PT/OT Cautious diuresis with lasix 20 mg daily. Monitor I's and O's.  Procalcitonin is negative   Paroxysmal atrial fibrillation ([name]) Continue eliquis   Dementia without behavioral disturbance ([name]) Cont bupropion Cont Aricept Cont Cymbalta Cont seroquel   Chronic diastolic CHF (congestive heart failure) ([name]) Attempt to diurese gently with lasix 20 mg daily.   Coronary artery disease involving native coronary artery of native heart without angina pectoris Cont eliquis Oddly, doesn't look like he's on a statin, but statin  not on his allergies list? Will defer to PCP.   CKD stage 3b, GFR 30-44 ml/min ([name]) Creat 2.05 today appears to be about his baseline.   OSA on CPAP Cont CPAP QHS   DM2 (diabetes mellitus, type 2) ([name]) Cont Semglee, tradjenda Sensitive SSI AC   I have seen and examined this patient myself. I have spent 32 minutes in his evaluation and care.      Advance Care Planning:    Code Status: Full Code   Consults: Neurology   Family Communication: No family inroom  DVT prophylaxis: Eliquis Code Status: Ful Code Family Communication: None available Disposition Plan: tbd    Latonga Ponder, DO [facility] Direct contact: see [email or web address]  7PM-7AM contact night coverage as above 09/13/2023, 3:55 PM  LOS: 0 days   Consultants  Neurology  Procedures  None  Antibiotics  None  Interval History/Subjective  The patient states that he feels about the same as when he came in. No new complaints.  Objective   Vitals:  Vitals:   09/13/23 0916 09/13/23 1216  BP:  (!) 155/77  Pulse:  68  Resp:  20  Temp:  (!) 97.5 F (36.4 C)  SpO2: 100% 100%    Exam:  Constitutional:  The patient is awake, alert, and oriented x 3. No acute distress. Respiratory:  No increased work of breathing. No wheezes, rales, or rhonchi No tactile fremitus Cardiovascular:  Regular rate and rhythm No murmurs, ectopy, or gallups. No lateral PMI. No thrills. Abdomen:  Abdomen is soft, non-tender, non-distended No hernias, masses, or organomegaly Normoactive bowel sounds.  Musculoskeletal:  No cyanosis, clubbing, or edema Skin:  No rashes, lesions, ulcers palpation of skin: no induration or nodules Neurologic:  CN 2-12 intact Sensation all 4 extremities intact Psychiatric:  Mental status Mood, affect appropriate Orientation to person, place, time  judgment and insight appear intact   I have personally reviewed the following:   Today's Data   Vitals:   09/13/23 0916 09/13/23 1216  BP:  (!) 155/77  Pulse:  68  Resp:  20  Temp:  (!) 97.5 F (36.4 C)  SpO2: 100% 100%     Lab Data  CBC    Component Value Date/Time   WBC 9.1 09/13/2023 0457   RBC 4.78 09/13/2023 0457   HGB 13.7 09/13/2023 0457   HGB 15.8 05/31/2021 1438   HCT 43.4 09/13/2023 0457   HCT 45.2 05/31/2021 1438   PLT 168 09/13/2023 0457   PLT 251 05/31/2021 1438   MCV 90.8  09/13/2023 0457   MCV 87 05/31/2021 1438   MCH 28.7 09/13/2023 0457   MCHC 31.6 09/13/2023 0457   RDW 15.8 (H) 09/13/2023 0457   RDW 13.2 05/31/2021 1438   LYMPHSABS 1.6 09/13/2023 0457   MONOABS 1.1 (H) 09/13/2023 0457   EOSABS 0.2 09/13/2023 0457   BASOSABS 0.1 09/13/2023 0457      Latest Ref Rng & Units 09/13/2023    4:57 AM 09/12/2023    3:13 AM 09/11/2023    4:09 PM  BMP  Glucose 70 - 99 mg/dL 79  161    BUN 8 - 23 mg/dL 24  21    Creatinine 0.96 - 1.24 mg/dL 0.45  4.09    Sodium 811 - 145 mmol/L 142  142  140   Potassium 3.5 - 5.1 mmol/L 4.3  4.2  4.3   Chloride 98 - 111 mmol/L 106  107    CO2 22 - 32 mmol/L 27  27  Calcium 8.9 - 10.3 mg/dL 8.7  8.6       Micro Data   Results for orders placed or performed during the hospital encounter of 02/26/23  SARS Coronavirus 2 by RT PCR (hospital order, performed in [facility] hospital lab) *cepheid single result test* Anterior Nasal Swab     Status: Abnormal   Collection Time: 02/26/23  4:02 PM   Specimen: Anterior Nasal Swab  Result Value Ref Range Status   SARS Coronavirus 2 by RT PCR POSITIVE (A) NEGATIVE Final    Comment: Performed at [facility], [address], [place], [name] [address]     Imaging  CT chest CXR  Cardiology Data  Echocardiogram  Scheduled Meds:  apixaban  5 mg Oral BID   budesonide  0.25 mg Nebulization Daily   buPROPion  300 mg Oral Daily   donepezil  10 mg Oral QHS   DULoxetine  20 mg Oral Daily   feeding supplement  237 mL Oral BID BM   insulin aspart  0-9 Units Subcutaneous TID WC   insulin glargine-yfgn  36 Units Subcutaneous Daily   linagliptin  5 mg Oral Daily   mirabegron ER  25 mg Oral Daily   pantoprazole  40 mg Oral q AM   QUEtiapine  50 mg Oral Daily   tamsulosin  0.4 mg Oral QPC breakfast   Continuous Infusions:  Principal Problem:   Generalized weakness Active Problems:   Paroxysmal atrial fibrillation ([name])   Dementia without behavioral disturbance  ([name])   Coronary artery disease involving native coronary artery of native heart without angina pectoris   Chronic diastolic CHF (congestive heart failure) ([name])   CKD stage 3b, GFR 30-44 ml/min ([name])   OSA on CPAP   DM2 (diabetes mellitus, type 2) ([name])   LOS: 0 days

## 2023-09-14 ENCOUNTER — Ambulatory Visit: Payer: Medicare Other | Admitting: Podiatry

## 2023-09-14 DIAGNOSIS — R531 Weakness: Secondary | ICD-10-CM | POA: Diagnosis not present

## 2023-09-14 LAB — CBC WITH DIFFERENTIAL/PLATELET
Abs Immature Granulocytes: 0.09 10*3/uL — ABNORMAL HIGH (ref 0.00–0.07)
Basophils Absolute: 0.1 10*3/uL (ref 0.0–0.1)
Basophils Relative: 1 %
Eosinophils Absolute: 0.3 10*3/uL (ref 0.0–0.5)
Eosinophils Relative: 3 %
HCT: 44.3 % (ref 39.0–52.0)
Hemoglobin: 14 g/dL (ref 13.0–17.0)
Immature Granulocytes: 1 %
Lymphocytes Relative: 16 %
Lymphs Abs: 1.5 10*3/uL (ref 0.7–4.0)
MCH: 28.5 pg (ref 26.0–34.0)
MCHC: 31.6 g/dL (ref 30.0–36.0)
MCV: 90.2 fL (ref 80.0–100.0)
Monocytes Absolute: 1.3 10*3/uL — ABNORMAL HIGH (ref 0.1–1.0)
Monocytes Relative: 13 %
Neutro Abs: 6.3 10*3/uL (ref 1.7–7.7)
Neutrophils Relative %: 66 %
Platelets: 175 10*3/uL (ref 150–400)
RBC: 4.91 MIL/uL (ref 4.22–5.81)
RDW: 15.4 % (ref 11.5–15.5)
WBC: 9.6 10*3/uL (ref 4.0–10.5)
nRBC: 0 % (ref 0.0–0.2)

## 2023-09-14 LAB — GLUCOSE, CAPILLARY
Glucose-Capillary: 57 mg/dL — ABNORMAL LOW (ref 70–99)
Glucose-Capillary: 115 mg/dL — ABNORMAL HIGH (ref 70–99)
Glucose-Capillary: 61 mg/dL — ABNORMAL LOW (ref 70–99)
Glucose-Capillary: 136 mg/dL — ABNORMAL HIGH (ref 70–99)
Glucose-Capillary: 119 mg/dL — ABNORMAL HIGH (ref 70–99)
Glucose-Capillary: 109 mg/dL — ABNORMAL HIGH (ref 70–99)

## 2023-09-14 MED ORDER — GLUCOSE 40 % PO GEL
1.0000 | ORAL | Status: AC
  Administered 2023-09-14: 31 g via ORAL
  Filled 2023-09-14: qty 1.21

## 2023-09-14 NOTE — Evaluation (Signed)
Occupational Therapy Evaluation Patient Details Name: Fernando Hess MRN: [id number] DOB: Dec 27, 1945 Today's Date: 09/14/2023   History of Present Illness 77 y.o. male admitted to the hospital on 09/11/23 for SOB and generalized weakness.  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 Pt with significant PMH of DM2, CKD III, CAD, CHF, dementia, PAF, ARF, NSTEMI, STEMI, CABG, R hip arthroplasty.   Clinical Impression   Pt reports having assist at baseline with ADL, uses rollator and primarily w/c for mobility. Pt currently needing min A -mod A for ADLs, min A for bed mobility and min +2 for transfers/ambulation with chair follow. Pt presenting with impairments listed below, will follow acutely. Patient will benefit from continued inpatient follow up therapy, <3 hours/day to maximize safety/ind with ADL/functional mobility.        If plan is discharge home, recommend the following: A little help with walking and/or transfers;A lot of help with bathing/dressing/bathroom;Assistance with cooking/housework;Direct supervision/assist for medications management;Direct supervision/assist for financial management;Help with stairs or ramp for entrance;Assist for transportation    Functional Status Assessment  Patient has had a recent decline in their functional status and demonstrates the ability to make significant improvements in function in a reasonable and predictable amount of time.  Equipment Recommendations  None recommended by OT    Recommendations for Other Services PT consult     Precautions / Restrictions Precautions Precautions: Fall Restrictions Weight Bearing Restrictions Per Provider Order: No      Mobility Bed Mobility Overal bed mobility: Needs Assistance Bed Mobility: Supine to Sit     Supine to sit: Min assist          Transfers Overall transfer level: Needs assistance Equipment used: Rolling walker (2 wheels) Transfers: Sit to/from Stand Sit to Stand: Min  assist, +2 safety/equipment                  Balance Overall balance assessment: Needs assistance Sitting-balance support: Feet unsupported, Bilateral upper extremity supported Sitting balance-Leahy Scale: Good     Standing balance support: During functional activity, Reliant on assistive device for balance Standing balance-Leahy Scale: Fair Standing balance comment: static stands for bathing task                           ADL either performed or assessed with clinical judgement   ADL Overall ADL's : Needs assistance/impaired Eating/Feeding: Set up   Grooming: Set up;Sitting   Upper Body Bathing: Sitting;Moderate assistance   Lower Body Bathing: Sitting/lateral leans;Moderate assistance   Upper Body Dressing : Minimal assistance;Sitting   Lower Body Dressing: Minimal assistance;Sitting/lateral leans   Toilet Transfer: Minimal assistance;Ambulation;Regular Toilet   Toileting- Clothing Manipulation and Hygiene: Minimal assistance       Functional mobility during ADLs: Minimal assistance;Rolling walker (2 wheels)       Vision Baseline Vision/History: 1 Wears glasses Vision Assessment?: No apparent visual deficits Additional Comments: wears glasses, eyes noted to be read R>L     Perception Perception: Not tested       Praxis Praxis: Not tested       Pertinent Vitals/Pain Pain Assessment Pain Assessment: No/denies pain     Extremity/Trunk Assessment Upper Extremity Assessment Upper Extremity Assessment: Generalized weakness   Lower Extremity Assessment Lower Extremity Assessment: Defer to PT evaluation   Cervical / Trunk Assessment Cervical / Trunk Assessment: Normal   Communication Communication Communication: No apparent difficulties   Cognition Arousal: Alert Behavior During Therapy: Anxious Overall Cognitive Status:  History of cognitive impairments - at baseline                                       General  Comments  VSS    Exercises     Shoulder Instructions      Home Living Family/patient expects to be discharged to:: Private residence Living Arrangements: Spouse/significant other Available Help at Discharge: Family;Personal care attendant;Available PRN/intermittently Type of Home: House Home Access: Ramped entrance;Stairs to enter Entrance Stairs-Number of Steps: 5 Entrance Stairs-Rails: Left Home Layout: Two level;Able to live on main level with bedroom/bathroom     Bathroom Shower/Tub: Chief Strategy Officer: Handicapped height Bathroom Accessibility: Yes How Accessible: Accessible via wheelchair Home Equipment: Rollator (4 wheels);Shower seat;Grab bars - tub/shower;BSC/3in1;Wheelchair - manual   Additional Comments: Has aide that comes 3 days/week for 3 hours to assist pt and his wife with ADL/IADLs and homemaking. pt's wife does some home tasks but he is unable to assist with them. pt reports his wife often asks him to assist but he is unable to assist. Pt also reporting wife recently broke her back and is needing more help      Prior Functioning/Environment Prior Level of Function : Needs assist             Mobility Comments: use of rollator to transfer to manual w/c ADLs Comments: pt has an aide that comes 3xs/wk for 2-3 hours helping mostly with housework, cleaning, cooking, and errands.        OT Problem List: Decreased strength;Decreased range of motion;Decreased activity tolerance;Impaired balance (sitting and/or standing);Decreased safety awareness      OT Treatment/Interventions: Self-care/ADL training;Therapeutic exercise;Energy conservation;DME and/or AE instruction;Therapeutic activities;Balance training;Patient/family education;Cognitive remediation/compensation    OT Goals(Current goals can be found in the care plan section) Acute Rehab OT Goals Patient Stated Goal: none stated OT Goal Formulation: With patient Time For Goal Achievement:  09/28/23 Potential to Achieve Goals: Good ADL Goals Pt Will Perform Upper Body Dressing: with supervision;sitting Pt Will Perform Lower Body Dressing: with supervision;sit to/from stand;sitting/lateral leans Pt Will Transfer to Toilet: with supervision;ambulating;regular height toilet Pt Will Perform Tub/Shower Transfer: Tub transfer;Shower transfer;with supervision;ambulating  OT Frequency: Min 1X/week    Co-evaluation PT/OT/SLP Co-Evaluation/Treatment: Yes Reason for Co-Treatment: Complexity of the patient's impairments (multi-system involvement);For patient/therapist safety;To address functional/ADL transfers   OT goals addressed during session: ADL's and self-care      AM-PAC OT "6 Clicks" Daily Activity     Outcome Measure Help from another person eating meals?: A Little Help from another person taking care of personal grooming?: A Little Help from another person toileting, which includes using toliet, bedpan, or urinal?: A Little Help from another person bathing (including washing, rinsing, drying)?: A Lot Help from another person to put on and taking off regular upper body clothing?: A Little Help from another person to put on and taking off regular lower body clothing?: A Little 6 Click Score: 17   End of Session Equipment Utilized During Treatment: Gait belt;Rolling walker (2 wheels) Nurse Communication: Mobility status  Activity Tolerance: Patient tolerated treatment well Patient left: with call bell/phone within reach;in chair;with chair alarm set  OT Visit Diagnosis: Unsteadiness on feet (R26.81);Other abnormalities of gait and mobility (R26.89);Muscle weakness (generalized) (M62.81)                Time: 4742-5956 OT Time Calculation (min): 25 min Charges:  OT General Charges $OT Visit: 1 Visit OT Evaluation $OT Eval Moderate Complexity: 1 Mod  Azaan Leask K, OTD, OTR/L SecureChat Preferred Acute Rehab [phone]   Dalphine Handing 09/14/2023, 12:57 PM

## 2023-09-14 NOTE — Progress Notes (Signed)
Physical Therapy Treatment Patient Details Name: Fernando Hess MRN: [id number] DOB: [birth year] Today's Date: 09/14/2023   History of Present Illness 77 y.o. male admitted to the hospital on 09/11/23 for SOB and generalized weakness.  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 Pt with significant PMH of DM2, CKD III, CAD, CHF, dementia, PAF, ARF, NSTEMI, STEMI, CABG, R hip arthroplasty.    PT Comments  Pt with great progress towards acute goals this session. Pt able to come to sitting EOB with min A, and transfer to stand x4 throughout session with light min A down to CGA with task practice. Pt reporting he is non-ambulatory in the home at baseline, however pt able to progress gait distance ~65' with mod A to steady and RW for support, chair follow provided for safety as pt fatigues quickly. Current plan remains appropriate to address deficits and maximize functional independence and decrease caregiver burden. Pt continues to benefit from skilled PT services to progress toward functional mobility goals.      If plan is discharge home, recommend the following: Two people to help with walking and/or transfers;A lot of help with bathing/dressing/bathroom;Assist for transportation;Help with stairs or ramp for entrance   Can travel by private vehicle     No  Equipment Recommendations  None recommended by PT    Recommendations for Other Services       Precautions / Restrictions Precautions Precautions: Fall Restrictions Weight Bearing Restrictions Per Provider Order: No     Mobility  Bed Mobility Overal bed mobility: Needs Assistance Bed Mobility: Supine to Sit     Supine to sit: Min assist          Transfers Overall transfer level: Needs assistance Equipment used: Rolling walker (2 wheels) Transfers: Sit to/from Stand, Bed to chair/wheelchair/BSC Sit to Stand: Min assist, +2 safety/equipment   Step pivot transfers: Min assist       General transfer comment: light  min A to steady    Ambulation/Gait Ambulation/Gait assistance: Mod assist, +2 safety/equipment (for chair follow) Gait Distance (Feet): 15 Feet (+ 65) Assistive device: Rolling walker (2 wheels) Gait Pattern/deviations: Step-through pattern, Decreased stride length, Decreased dorsiflexion - right, Decreased dorsiflexion - left, Shuffle Gait velocity: decr     General Gait Details: cues for increased LE clearance as pt with tendency for short shuffling steps, chair follow for safety   Stairs             Wheelchair Mobility     Tilt Bed    Modified Rankin (Stroke Patients Only)       Balance Overall balance assessment: Needs assistance Sitting-balance support: Feet unsupported, Bilateral upper extremity supported Sitting balance-Leahy Scale: Good     Standing balance support: During functional activity, Reliant on assistive device for balance Standing balance-Leahy Scale: Fair Standing balance comment: static stands for bathing task                            Cognition Arousal: Alert Behavior During Therapy: Anxious Overall Cognitive Status: History of cognitive impairments - at baseline                                          Exercises      General Comments General comments (skin integrity, edema, etc.): VSS      Pertinent Vitals/Pain Pain Assessment Pain Assessment: No/denies  pain    Home Living Family/patient expects to be discharged to:: Private residence Living Arrangements: Spouse/significant other Available Help at Discharge: Family;Personal care attendant;Available PRN/intermittently Type of Home: House Home Access: Ramped entrance;Stairs to enter Entrance Stairs-Rails: Left Entrance Stairs-Number of Steps: 5   Home Layout: Two level;Able to live on main level with bedroom/bathroom Home Equipment: Rollator (4 wheels);Shower seat;Grab bars - tub/shower;BSC/3in1;Wheelchair - manual Additional Comments: Has aide  that comes 3 days/week for 3 hours to assist pt and his wife with ADL/IADLs and homemaking. pt's wife does some home tasks but he is unable to assist with them. pt reports his wife often asks him to assist but he is unable to assist. Pt also reporting wife recently broke her back and is needing more help    Prior Function            PT Goals (current goals can now be found in the care plan section) Acute Rehab PT Goals PT Goal Formulation: With patient Time For Goal Achievement: 09/26/23 Progress towards PT goals: Progressing toward goals    Frequency    Min 1X/week      PT Plan      Co-evaluation PT/OT/SLP Co-Evaluation/Treatment: Yes Reason for Co-Treatment: Complexity of the patient's impairments (multi-system involvement);For patient/therapist safety;To address functional/ADL transfers PT goals addressed during session: Proper use of DME;Balance;Mobility/safety with mobility OT goals addressed during session: ADL's and self-care      AM-PAC PT "6 Clicks" Mobility   Outcome Measure  Help needed turning from your back to your side while in a flat bed without using bedrails?: A Little Help needed moving from lying on your back to sitting on the side of a flat bed without using bedrails?: A Little Help needed moving to and from a bed to a chair (including a wheelchair)?: A Lot Help needed standing up from a chair using your arms (e.g., wheelchair or bedside chair)?: A Lot Help needed to walk in hospital room?: A Lot Help needed climbing 3-5 steps with a railing? : Total 6 Click Score: 13    End of Session Equipment Utilized During Treatment: Gait belt Activity Tolerance: Patient tolerated treatment well Patient left: in chair;with call bell/phone within reach;with chair alarm set Nurse Communication: Mobility status PT Visit Diagnosis: Muscle weakness (generalized) (M62.81);Difficulty in walking, not elsewhere classified (R26.2)     Time: 5188-4166 PT Time  Calculation (min) (ACUTE ONLY): 26 min  Charges:    $Gait Training: 8-22 mins PT General Charges $$ ACUTE PT VISIT: 1 Visit                     Udell Blasingame R. PTA Acute Rehabilitation Services Office: [phone]   Catalina Antigua 09/14/2023, 3:20 PM

## 2023-09-14 NOTE — Progress Notes (Signed)
Patient alert and oriented x 3 and being assisted to the bathroom as he is walking with the walker with steady gait without difficulty.  Patient now sitting on toilet and wife in room.  Wife stated "when the patient falls he falls backward."  Questions answered and now requesting to speak with the MD.  Post bathroom care given by CNA and bed made per request of wife.  Will continue to monitor.

## 2023-09-14 NOTE — Progress Notes (Signed)
RT NOTE: PT performed NIF with great patient effort and achieved greater than -40.Fernando Hess

## 2023-09-14 NOTE — Inpatient Diabetes Management (Signed)
Inpatient Diabetes Program Recommendations  AACE/ADA: New Consensus Statement on Inpatient Glycemic Control (2015)  Target Ranges:  Prepandial:   less than 140 mg/dL      Peak postprandial:   less than 180 mg/dL (1-2 hours)      Critically ill patients:  140 - 180 mg/dL   Lab Results  Component Value Date   GLUCAP 115 (H) 09/14/2023   HGBA1C 10.9 (H) 02/05/2023    Review of Glycemic Control  Latest Reference Range & Units 09/13/23 07:47 09/13/23 12:02 09/13/23 16:35 09/13/23 21:21 09/14/23 06:11 09/14/23 06:39 09/14/23 07:04  Glucose-Capillary 70 - 99 mg/dL 79 71 308 (H) 79 57 (L) 61 (L) 115 (H)   Diabetes history: DM 2 Outpatient Diabetes medications: Lantus 36 units Daily, Humalog 5 units tid, Tradjenta 5 mg Daily Current orders for Inpatient glycemic control:  Tradjenta 5 mg Daily Semglee 36 units qhs Novolog 0-9 units tid   Note: hypoglycemia on home doses of insulin  Inpatient Diabetes Program Recommendations:    -   Reduce Semglee to 30 units  Thanks,  Christena Deem RN, MSN, BC-ADM Inpatient Diabetes Coordinator Team Pager [phone] (8a-5p)

## 2023-09-14 NOTE — Progress Notes (Signed)
RT NOTE: PT performed NIF of greater than -40 with great patient effort.

## 2023-09-14 NOTE — Progress Notes (Addendum)
Name: Fernando Hess DOB: 1946-08-06  Please be advised that the above-named patient will require a short-term nursing home stay -- anticipated 30 days or less for rehabilitation and strengthening. The plan is for return home.    Name: [name] DOB: 1946/01/27  Please be advised that the above-named patient has a primary diagnosis of dementia which supersedes any psychiatric diagnosis.

## 2023-09-14 NOTE — Progress Notes (Signed)
Progress Note   Patient: Fernando Hess WUX:[id number] DOB: [birth year] DOA: 09/11/2023     0 DOS: the patient was seen and examined on 09/14/2023   Brief hospital course: The patient is a 77 yr old man who presented to [facility] ED on 09/11/2023 with complaint of worsening shortness of breath x 2 days. He has a past medical history significant for CKD 3b, CAD s/p CABG, CHF, OSA, DM2 and myasthenia gravis. The patient feels that this is similar to previous episodes of exacerbation of myasthenia gravis. The patient has been worked up as outpatient by Dr. Otelia Limes who does not feel that the patient's work up for [facility] is consistent with myasthenia gravis. He has failed treatment with Mestinon, prednisone, and IVIG. He has a plan to start Ultomiris and an active referral to [facility] and Nita Sickle MD at [facility]. There is some evidence that this is a functional disorder and not MG.He has no bulbar weakness on exam. He has suggested that the patient is suffering from an intrinsic lung pathology. NIF's in the ED have ranged from -24 to -40. FVC is 1.25.   The patient has been admitted. CT chest has demonstrated patchy left upper lobe ground-grass airspace opacities suggesting infection or inflammation. There is a recommendation for follow up CT in 3 months to exclude an underlying malignancy. There is a loculated right pleural effusion, cardiomegaly, and an aortopulmonary lymph node measuring up to 1 cm. 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 Annual imaging by CTA or MRA is recommended.    The patient has been admitted to a progressive bed. NIF's will be followed. The patient will receive lasix 20 mg daily and I's and O's will be followed. Neurology is following. Echocardiogram was obtained and has demonstrated mild LVH, Normal LV and RV systolic function. There are not LV regional wall motion abnormalities. The left atrial size is moderately dilated. There is mild to moderate aortic  stenosis. Procalcitonin was negative.   Will attempt to wean O2 to off. If patient is unable to wean off of O2 may consider cardiology consult to rule out dyspnea as an anginal equivalent.   The patient states that today he is feeling the same.  Assessment and Plan: * Generalized weakness - Physical therapy is following along  - Pulmicort daily w/ NIF monitoring  - Ensure enlive PO bid   Paroxysmal atrial fibrillation ([name]) - Eliquis 5 mg PO bid   Dementia without behavioral disturbance ([name]) - Wellbutrin XL 300 mg PO daily  - Aricept 10 mg PO daily  - Cymbalta 20 mg PO daily  - Seroquel 50 mg PO daily   Chronic diastolic CHF (congestive heart failure) ([name]) - Monitor  Coronary artery disease involving native coronary artery of native heart without angina pectoris - Eliquis as above   CKD stage 3b, GFR 30-44 ml/min ([name]) - Stable monitor   OSA  - CPAP at bedtime   DM2 (diabetes mellitus, type 2) ([name]) - Novolog SS tid - Semglee 36 units sq daily  - Linagliptin 5 mg PO daily       Subjective: Pt seen and examined at the bedside. He reports his breathing is improved this morning. He is working with PT and respiratory to monitor his NIFs.  Physical Exam: Vitals:   09/14/23 0433 09/14/23 0808 09/14/23 0849 09/14/23 1118  BP: (!) 162/77 (!) 160/89  129/84  Pulse: 85 66 67 71  Resp: 18 18 19  19  Temp: 97.8 F (36.6 C) 98 F (36.7 C)  97.6 F (36.4 C)  TempSrc: Oral Oral  Oral  SpO2: 96% 97% 95% 94%  Weight:      Height:       Physical Exam HENT:     Head: Normocephalic.  Cardiovascular:     Rate and Rhythm: Normal rate and regular rhythm.  Pulmonary:     Effort: Pulmonary effort is normal.  Abdominal:     Palpations: Abdomen is soft.  Skin:    General: Skin is warm.  Neurological:     Mental Status: He is alert and oriented to person, place, and time.  Psychiatric:        Mood and Affect: Mood normal.      Disposition: Status is:  Observation The patient remains OBS appropriate and will d/c before 2 midnights.  Planned Discharge Destination:  Dispo per pt's clinical progress     Time spent: 35 minutes  Author: Baron Hamper , MD 09/14/2023 12:07 PM  For on call review [email or web address].

## 2023-09-14 NOTE — [place] FL2 (Signed)
[place] MEDICAID FL2 LEVEL OF CARE FORM     IDENTIFICATION  Patient Name: Fernando Hess Birthdate: February 28, 1946 Sex: male Admission Date (Current Location): 09/11/2023  [facility] and [place] Number:  Producer, television/film/video and Address:  [facility], [address], [place], [name] [address]      Provider Number: [id number]  Attending Physician Name and Address:  Baron Hamper, MD  Relative Name and Phone Number:       Current Level of Care: Hospital Recommended Level of Care: Skilled Nursing Facility Prior Approval Number:    Date Approved/Denied:   PASRR Number:  Pending  Discharge Plan: SNF    Current Diagnoses: Patient Active Problem List   Diagnosis Date Noted   BPH (benign prostatic hyperplasia) 02/27/2023   History of anemia due to chronic kidney disease 02/27/2023   COVID-19 02/27/2023   COVID-19 virus infection 02/26/2023   Acute kidney injury superimposed on chronic kidney disease ([name]) 02/03/2023   Transient hypotension 02/03/2023   SIRS (systemic inflammatory response syndrome) ([name]) 02/03/2023   Dementia without behavioral disturbance ([name]) 02/03/2023   Fall at home, initial encounter 02/03/2023   Acute respiratory failure with hypoxia ([name]) 02/03/2023   Hypokalemia 02/03/2023   Prolonged QT interval 02/03/2023   Left ankle pain 01/08/2023   Hematuria 01/08/2023   Fracture of femoral neck, right, closed ([name]) 12/24/2022   Depression with anxiety 12/24/2022   Acute metabolic encephalopathy 09/18/2022   Acute encephalopathy 09/17/2022   AKI (acute kidney injury) ([name]) 09/04/2022   Generalized weakness 08/12/2022   Myasthenia gravis ([name]) 04/21/2022   CKD stage 3b, GFR 30-44 ml/min ([name]) 03/21/2022   Posterior vitreous detachment of both eyes 01/23/2022   Constipation 11/13/2021   DM2 (diabetes mellitus, type 2) ([name]) 11/13/2021   Severe major depression, single episode, without psychotic features ([name]) 11/13/2021   Amaurosis  fugax 11/13/2021   Amnesia 11/13/2021   Anxiety 11/13/2021   Cardiac arrhythmia 11/13/2021   Hearing loss 11/13/2021   Hypercoagulable state ([name]) 11/13/2021   Hyperparathyroidism due to renal insufficiency ([name]) 11/13/2021   Mild aortic stenosis 11/05/2021   Paroxysmal atrial fibrillation ([name]) 11/05/2021   Nasal septal deviation 08/30/2021   Anticoagulated by anticoagulation treatment 07/16/2021   Syncope 05/10/2021   Gastro-esophageal reflux disease without esophagitis 07/25/2020   Globus pharyngeus 07/25/2020   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) 12/21/2019   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   Chronic diastolic CHF (congestive heart failure) ([name]) 11/18/2019   OSA on CPAP 03/07/2019   Peripheral artery disease    Intractable vascular headache 11/11/2018   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   Coronary artery disease involving native coronary artery of native heart without angina pectoris 09/24/2015   Moderate nonproliferative diabetic retinopathy of left eye with macular edema associated with type 2 diabetes mellitus ([name]) 03/01/2008   Hyperlipidemia    Essential hypertension    History of kidney stones     Orientation RESPIRATION BLADDER Height & Weight     Self, Time, Situation, Place  Normal Incontinent Weight: 165 lb (74.8 kg) Height:  5\' 6"  (167.6 cm)  BEHAVIORAL SYMPTOMS/MOOD NEUROLOGICAL BOWEL NUTRITION STATUS      Continent Diet (carb modified)  AMBULATORY STATUS COMMUNICATION OF NEEDS Skin   Extensive Assist Verbally Normal                       Personal Care Assistance Level of Assistance  Bathing, Feeding, Dressing  Bathing Assistance: Maximum assistance Feeding assistance: Limited assistance Dressing Assistance: Limited assistance     Functional Limitations Info  Sight Sight Info: Impaired (wears glasses)        SPECIAL CARE FACTORS FREQUENCY  PT (By licensed PT), OT (By licensed OT)     PT  Frequency: 5x/wk OT Frequency: 5x/wk            Contractures Contractures Info: Not present    Additional Factors Info  Code Status, Allergies, Psychotropic, Insulin Sliding Scale Code Status Info: Full Allergies Info: Cilostazol, Dulaglutide, Levofloxacin, Liraglutide, Lisinopril Psychotropic Info: Wellbutrin XL 300mg  daily; Aricept 10mg  daily at bed; Cymbalta 20mg  daily; Seroquel 50mg  daily at bed Insulin Sliding Scale Info: see DC summary       Current Medications (09/14/2023):  This is the current hospital active medication list Current Facility-Administered Medications  Medication Dose Route Frequency Provider Last Rate Last Admin   acetaminophen (TYLENOL) tablet 650 mg  650 mg Oral Q6H PRN Hillary Bow, DO       Or   acetaminophen (TYLENOL) suppository 650 mg  650 mg Rectal Q6H PRN Hillary Bow, DO       apixaban Everlene Balls) tablet 5 mg  5 mg Oral BID Lyda Perone M, DO   5 mg at 09/14/23 1013   budesonide (PULMICORT) nebulizer solution 0.25 mg  0.25 mg Nebulization Daily Lyda Perone M, DO   0.25 mg at 09/14/23 0848   buPROPion (WELLBUTRIN XL) 24 hr tablet 300 mg  300 mg Oral Daily Lyda Perone M, DO   300 mg at 09/14/23 1013   donepezil (ARICEPT) tablet 10 mg  10 mg Oral QHS Hillary Bow, DO   10 mg at 09/13/23 2156   DULoxetine (CYMBALTA) DR capsule 20 mg  20 mg Oral Daily Lyda Perone M, DO   20 mg at 09/14/23 1013   feeding supplement (ENSURE ENLIVE / ENSURE PLUS) liquid 237 mL  237 mL Oral BID BM Swayze, Ava, DO   237 mL at 09/14/23 1344   hydrALAZINE (APRESOLINE) injection 5-10 mg  5-10 mg Intravenous Q4H PRN Hillary Bow, DO       insulin aspart (novoLOG) injection 0-9 Units  0-9 Units Subcutaneous TID WC Hillary Bow, DO   1 Units at 09/13/23 1731   insulin glargine-yfgn (SEMGLEE) injection 36 Units  36 Units Subcutaneous Daily Lyda Perone M, DO   36 Units at 09/14/23 1014   lactulose (CHRONULAC) 10 GM/15ML solution 20 g  20 g Oral Daily  Swayze, Ava, DO   20 g at 09/14/23 1013   linagliptin (TRADJENTA) tablet 5 mg  5 mg Oral Daily Lyda Perone M, DO   5 mg at 09/14/23 1019   melatonin tablet 5 mg  5 mg Oral QHS PRN Gery Pray, MD   5 mg at 09/12/23 2114   mirabegron ER (MYRBETRIQ) tablet 25 mg  25 mg Oral Daily Lyda Perone M, DO   25 mg at 09/14/23 1013   pantoprazole (PROTONIX) EC tablet 40 mg  40 mg Oral q AM Hillary Bow, DO   40 mg at 09/14/23 7829   QUEtiapine (SEROQUEL) tablet 50 mg  50 mg Oral Daily Lyda Perone M, DO   50 mg at 09/14/23 1013   senna-docusate (Senokot-S) tablet 1 tablet  1 tablet Oral BID PRN Hillary Bow, DO       tamsulosin [facility]) capsule 0.4 mg  0.4 mg Oral QPC breakfast Hillary Bow, DO  0.4 mg at 09/14/23 1015     Discharge Medications: Please see discharge summary for a list of discharge medications.  Relevant Imaging Results:  Relevant Lab Results:   Additional Information SS#: [id number]  Baldemar Lenis, LCSW

## 2023-09-14 NOTE — Progress Notes (Signed)
   09/14/23 2023  BiPAP/CPAP/SIPAP  BiPAP/CPAP/SIPAP Pt Type Adult  BiPAP/CPAP/SIPAP Resmed  Reason BIPAP/CPAP not in use Non-compliant   Pt states he doesn't wear CPAP, doesn't want to wear CPAP for the night

## 2023-09-14 NOTE — TOC Initial Note (Signed)
Transition of Care [facility]) - Initial/Assessment Note    Patient Details  Name: Fernando Hess MRN: [id number] Date of Birth: 06/01/1946  [facility]) CM/SW Contact:    Baldemar Lenis, LCSW Phone Number: 09/14/2023, 3:34 PM  Clinical Narrative:         CSW met with patient and spouse at bedside to discuss recommendation for SNF. Patient initially refusing, saying he would go home, but after further discussion spouse cannot provide assist for patient and it is not safe for him to go home. Patient requesting either [facility] or [facility] for SNF. CSW completed referral, sent to [facility] and [facility] to review. CSW to follow.          Expected Discharge Plan: Skilled Nursing Facility Barriers to Discharge: Continued Medical Work up, English as a second language teacher   Patient Goals and CMS Choice Patient states their goals for this hospitalization and ongoing recovery are:: to get back home CMS Medicare.gov Compare Post Acute Care list provided to:: Patient Choice offered to / list presented to : Patient [place] ownership interest in [facility].provided to:: Patient    Expected Discharge Plan and Services     Post Acute Care Choice: Skilled Nursing Facility Living arrangements for the past 2 months: Single Family Home                                      Prior Living Arrangements/Services Living arrangements for the past 2 months: Single Family Home Lives with:: Spouse Patient language and need for interpreter reviewed:: No Do you feel safe going back to the place where you live?: Yes      Need for Family Participation in Patient Care: No (Comment) Care giver support system in place?: No (comment) Current home services: Homehealth aide, DME, Home PT, Home RN, Home OT Criminal Activity/Legal Involvement Pertinent to Current Situation/Hospitalization: No - Comment as needed  Activities of Daily Living   ADL Screening (condition at time of  admission) Independently performs ADLs?: No Does the patient have a NEW difficulty with bathing/dressing/toileting/self-feeding that is expected to last >3 days?: No Does the patient have a NEW difficulty with getting in/out of bed, walking, or climbing stairs that is expected to last >3 days?: No Does the patient have a NEW difficulty with communication that is expected to last >3 days?: No Is the patient deaf or have difficulty hearing?: No Does the patient have difficulty seeing, even when wearing glasses/contacts?: No Does the patient have difficulty concentrating, remembering, or making decisions?: No  Permission Sought/Granted Permission sought to share information with : Facility Medical sales representative, Family Supports Permission granted to share information with : Yes, Verbal Permission Granted  Share Information with NAME: Carney Bern  Permission granted to share info w AGENCY: SNF  Permission granted to share info w Relationship: Spouse     Emotional Assessment Appearance:: Appears stated age Attitude/Demeanor/Rapport: Engaged Affect (typically observed): Appropriate Orientation: : Oriented to Self, Oriented to Place, Oriented to  Time, Oriented to Situation Alcohol / Substance Use: Not Applicable Psych Involvement: No (comment)  Admission diagnosis:  SOB (shortness of breath) [R06.02] Generalized weakness [R53.1] Patient Active Problem List   Diagnosis Date Noted   BPH (benign prostatic hyperplasia) 02/27/2023   History of anemia due to chronic kidney disease 02/27/2023   COVID-19 02/27/2023   COVID-19 virus infection 02/26/2023   Acute kidney injury superimposed on chronic kidney disease ([name]) 02/03/2023  Transient hypotension 02/03/2023   SIRS (systemic inflammatory response syndrome) ([name]) 02/03/2023   Dementia without behavioral disturbance ([name]) 02/03/2023   Fall at home, initial encounter 02/03/2023   Acute respiratory failure with hypoxia ([name]) 02/03/2023    Hypokalemia 02/03/2023   Prolonged QT interval 02/03/2023   Left ankle pain 01/08/2023   Hematuria 01/08/2023   Fracture of femoral neck, right, closed ([name]) 12/24/2022   Depression with anxiety 12/24/2022   Acute metabolic encephalopathy 09/18/2022   Acute encephalopathy 09/17/2022   AKI (acute kidney injury) ([name]) 09/04/2022   Generalized weakness 08/12/2022   Myasthenia gravis ([name]) 04/21/2022   CKD stage 3b, GFR 30-44 ml/min ([name]) 03/21/2022   Posterior vitreous detachment of both eyes 01/23/2022   Constipation 11/13/2021   DM2 (diabetes mellitus, type 2) ([name]) 11/13/2021   Severe major depression, single episode, without psychotic features ([name]) 11/13/2021   Amaurosis fugax 11/13/2021   Amnesia 11/13/2021   Anxiety 11/13/2021   Cardiac arrhythmia 11/13/2021   Hearing loss 11/13/2021   Hypercoagulable state ([name]) 11/13/2021   Hyperparathyroidism due to renal insufficiency ([name]) 11/13/2021   Mild aortic stenosis 11/05/2021   Paroxysmal atrial fibrillation ([name]) 11/05/2021   Nasal septal deviation 08/30/2021   Anticoagulated by anticoagulation treatment 07/16/2021   Syncope 05/10/2021   Gastro-esophageal reflux disease without esophagitis 07/25/2020   Globus pharyngeus 07/25/2020   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) 12/21/2019   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   Chronic diastolic CHF (congestive heart failure) ([name]) 11/18/2019   OSA on CPAP 03/07/2019   Peripheral artery disease    Intractable vascular headache 11/11/2018   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 AT [address] OF [facility] [address] [name] [address] Phone: [phone] Fax: [phone]  [facility] [address] [name] [address] Phone: [phone] Fax: [phone]  [facility] [address] [name] [address] Phone: [phone] Fax: [phone]     Social Drivers of Health (SDOH) Social History: SDOH Screenings   Food Insecurity: No Food Insecurity (09/12/2023)  Housing: Low Risk  (09/12/2023)  Transportation Needs: No Transportation Needs (09/12/2023)  Utilities: Not At Risk (09/12/2023)  Depression (PHQ2-9): Low Risk  (05/20/2019)  Social Connections: Unknown (01/15/2023)   Received from [facility], [facility]  Tobacco Use: Low Risk  (09/11/2023)   SDOH Interventions:     Readmission Risk Interventions     No data to display

## 2023-09-14 NOTE — Progress Notes (Signed)
NIF -40 with good pt effort.  

## 2023-09-15 ENCOUNTER — Telehealth: Payer: Self-pay | Admitting: Internal Medicine

## 2023-09-15 DIAGNOSIS — N261 Atrophy of kidney (terminal): Secondary | ICD-10-CM | POA: Diagnosis present

## 2023-09-15 DIAGNOSIS — G4733 Obstructive sleep apnea (adult) (pediatric): Secondary | ICD-10-CM | POA: Diagnosis present

## 2023-09-15 DIAGNOSIS — I480 Paroxysmal atrial fibrillation: Secondary | ICD-10-CM | POA: Diagnosis present

## 2023-09-15 DIAGNOSIS — G7000 Myasthenia gravis without (acute) exacerbation: Secondary | ICD-10-CM | POA: Diagnosis present

## 2023-09-15 DIAGNOSIS — F0390 Unspecified dementia without behavioral disturbance: Secondary | ICD-10-CM | POA: Diagnosis present

## 2023-09-15 DIAGNOSIS — I5032 Chronic diastolic (congestive) heart failure: Secondary | ICD-10-CM | POA: Diagnosis present

## 2023-09-15 DIAGNOSIS — I252 Old myocardial infarction: Secondary | ICD-10-CM | POA: Diagnosis not present

## 2023-09-15 DIAGNOSIS — E16A1 Hypoglycemia level 1: Secondary | ICD-10-CM | POA: Diagnosis not present

## 2023-09-15 DIAGNOSIS — R0602 Shortness of breath: Secondary | ICD-10-CM | POA: Diagnosis present

## 2023-09-15 DIAGNOSIS — E1151 Type 2 diabetes mellitus with diabetic peripheral angiopathy without gangrene: Secondary | ICD-10-CM | POA: Diagnosis present

## 2023-09-15 DIAGNOSIS — Z881 Allergy status to other antibiotic agents status: Secondary | ICD-10-CM | POA: Diagnosis not present

## 2023-09-15 DIAGNOSIS — R531 Weakness: Secondary | ICD-10-CM | POA: Diagnosis present

## 2023-09-15 DIAGNOSIS — I350 Nonrheumatic aortic (valve) stenosis: Secondary | ICD-10-CM | POA: Diagnosis present

## 2023-09-15 DIAGNOSIS — I130 Hypertensive heart and chronic kidney disease with heart failure and stage 1 through stage 4 chronic kidney disease, or unspecified chronic kidney disease: Secondary | ICD-10-CM | POA: Diagnosis present

## 2023-09-15 DIAGNOSIS — E113392 Type 2 diabetes mellitus with moderate nonproliferative diabetic retinopathy without macular edema, left eye: Secondary | ICD-10-CM | POA: Diagnosis present

## 2023-09-15 DIAGNOSIS — E1122 Type 2 diabetes mellitus with diabetic chronic kidney disease: Secondary | ICD-10-CM | POA: Diagnosis present

## 2023-09-15 DIAGNOSIS — I2510 Atherosclerotic heart disease of native coronary artery without angina pectoris: Secondary | ICD-10-CM | POA: Diagnosis present

## 2023-09-15 DIAGNOSIS — N1832 Chronic kidney disease, stage 3b: Secondary | ICD-10-CM | POA: Diagnosis present

## 2023-09-15 DIAGNOSIS — Z794 Long term (current) use of insulin: Secondary | ICD-10-CM | POA: Diagnosis not present

## 2023-09-15 DIAGNOSIS — Z79899 Other long term (current) drug therapy: Secondary | ICD-10-CM | POA: Diagnosis not present

## 2023-09-15 DIAGNOSIS — E785 Hyperlipidemia, unspecified: Secondary | ICD-10-CM | POA: Diagnosis present

## 2023-09-15 DIAGNOSIS — Z7951 Long term (current) use of inhaled steroids: Secondary | ICD-10-CM | POA: Diagnosis not present

## 2023-09-15 DIAGNOSIS — Z7901 Long term (current) use of anticoagulants: Secondary | ICD-10-CM | POA: Diagnosis not present

## 2023-09-15 DIAGNOSIS — E11649 Type 2 diabetes mellitus with hypoglycemia without coma: Secondary | ICD-10-CM | POA: Diagnosis not present

## 2023-09-15 LAB — GLUCOSE, CAPILLARY
Glucose-Capillary: 71 mg/dL (ref 70–99)
Glucose-Capillary: 177 mg/dL — ABNORMAL HIGH (ref 70–99)
Glucose-Capillary: 234 mg/dL — ABNORMAL HIGH (ref 70–99)
Glucose-Capillary: 159 mg/dL — ABNORMAL HIGH (ref 70–99)

## 2023-09-15 NOTE — Progress Notes (Signed)
 PROGRESS NOTE    Fernando Hess  FMW:[id number] DOB: 1945-12-09 DOA: 09/11/2023 PCP: Okey Carlin Redbird, MD  Chief Complaint  Patient presents with   Shortness of Breath    Hospital Course:  Fernando Hess is 77 y.o. male with CKD stage IIIb, CAD status post CABG, CHF, OSA, diabetes, myasthenia gravis, dementia.  He presents to the ED on 12/27 complaining of shortness of breath x 2 days.  The patient feels that this is similar to his prior exacerbations of myasthenia gravis.  He has been worked up outpatient by Dr. Merrianne who did not feel that the patient's workup was consistent with myasthenia gravis.  He failed outpatient treatment with Mestinon, and his own, and IVIG.  He has plans to start Ultomiris and active referral to [facility] neurology and Tonita Blanch MD at [facility].  Some concern that this is a functional disorder and not myasthenia gravis.  He has no bulbar weakness on exam.  It was suggested that the patient is suffering from intrinsic lung pathology.  NIF's in the ED have ranged from -24 to -40.  FVC is 1.25.  The patient was admitted.  CT chest demonstrated patchy left upper lobe groundglass opacities suggesting infection or inflammation.  There was recommendation to follow-up with CT in 3 months to exclude underlying malignancy.  There is also a loculated right pleural effusion, cardiomegaly, and aortopulmonary lymph node measuring up to 1 cm.  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  Annual imaging by CTA or MRI was recommended. Patient was admitted to a progressive bed.  NIF's have been followed.  The patient received 20 mg Lasix  with strict I's and O's.  Neurology was consulted and following.  Echocardiogram was obtained which revealed LVH, but preserved LV and RV systolic function. Has now been weaned entirely off oxygen  Subjective: On evaluation this morning patient reports he is feeling good.  He is not currently on oxygen.  He denies any shortness of  breath at this time.   Objective: Vitals:   09/15/23 0108 09/15/23 0406 09/15/23 0831 09/15/23 0856  BP: 130/76 (!) 143/70  (!) (P) 168/82  Pulse: 69 77 68 (P) 69  Resp:  18 16 (P) 20  Temp: 98 F (36.7 C) 98.1 F (36.7 C)  (P) 98.5 F (36.9 C)  TempSrc: Oral Oral  (P) Oral  SpO2: 93% 97% 94% (P) 97%  Weight:      Height:        Intake/Output Summary (Last 24 hours) at 09/15/2023 9061 Last data filed at 09/15/2023 0400 Gross per 24 hour  Intake --  Output 800 ml  Net -800 ml   Filed Weights   09/11/23 1505  Weight: 74.8 kg    Examination: General exam: Appears calm and comfortable, NAD  Respiratory system: No work of breathing, symmetric chest wall expansion Cardiovascular system: S1 & S2 heard, RRR.  Gastrointestinal system: Abdomen is nondistended, soft and nontender.  Neuro: Alert and oriented.  Extremities: Symmetric, expected ROM Skin: No rashes, lesions Psychiatry: Demonstrates appropriate judgement and insight. Mood & affect appropriate for situation.   Assessment & Plan:  Principal Problem:   Generalized weakness Active Problems:   Paroxysmal atrial fibrillation ([name])   Dementia without behavioral disturbance ([name])   Coronary artery disease involving native coronary artery of native heart without angina pectoris   Chronic diastolic CHF (congestive heart failure) ([name])   CKD stage 3b, GFR 30-44 ml/min ([name])   OSA on  CPAP   DM2 (diabetes mellitus, type 2) ([name])  Generalized weakness Shortness of breath - Initially there was concern for myasthenic crisis.  Neurology was consulted.  Appears that there is less concerned about MG crisis and unclear if he truly needs treatment.  At this point they are not recommending IVIG nor Mestinon.  Please see neurology notes for more details. Consider outpt work up for singer-fiber EMG - NIF/TVC every 4h initially, have been within normal range since admission.  Now every 12. - CT chest without contrast left upper lobe  groundglass opacities.  Inflammation versus infection.  Repeat CT in 3 months to ensure resolution and rule out underlying malignancy.  Trace loculated right pleural effusion.  Aortopulmonary lymph node measuring up to 1 cm, no gross hilar adenopathy. --Patient already follows with pulmonology outpatient - Echocardiogram: EF 60 to 65%.  No regional wall motion abnormalities, mild left ventricular hypertrophy. - Patient has weaned to room air, has good aeration bilaterally, no wheezing or rales on exam  Ascending thoracic aorta enlargement - Measuring up to 4 cm - Follow-up annual imaging with CTA or MRA  Paroxysmal A-fib - Continue Eliquis   Dementia without behavioral disturbance - Continue all current meds  Chronic diastolic heart failure - Echocardiogram: 60 to 65%.  No regional wall motion abnormalities, mild left ventricular hypertrophy. - Continue gentle diuresis  CAD involving native coronary artery of native heart without angina pectoralis - Continue home meds, does not appear he is currently on statin.  Consider at discharge  CKD stage IIIb - Baseline creatinine appears close to 2, continue to monitor with daily CMP - Renally dose when needed - Avoid nephrotoxic meds  Type 2 diabetes - Continue sliding scale insulin , basal/bolus.  Titrate up as tolerated  OSA on CPAP - Continue CPAP nightly     DVT prophylaxis: Home dose Eliquis    Code Status: Full Code Family Communication:  None at bedside today, have communicated directly with patient Disposition:  Status is: Admit to inpt given 4 midnights passed. TOC consulted and working on SNF placement    Consultants:  Neurology    Procedures:    Antimicrobials:  Anti-infectives (From admission, onward)    None       Data Reviewed: I have personally reviewed following labs and imaging studies CBC: Recent Labs  Lab 09/11/23 1524 09/11/23 1609 09/12/23 0313 09/13/23 0457 09/14/23 0606  WBC 8.4  --  9.6  9.1 9.6  NEUTROABS 5.4  --   --  5.9 6.3  HGB 13.5 14.3 14.0 13.7 14.0  HCT 43.3 42.0 44.6 43.4 44.3  MCV 92.3  --  92.0 90.8 90.2  PLT 188  --  168 168 175   Basic Metabolic Panel: Recent Labs  Lab 09/11/23 1524 09/11/23 1609 09/12/23 0313 09/13/23 0457  NA 140 140 142 142  K 4.4 4.3 4.2 4.3  CL 106  --  107 106  CO2 26  --  27 27  GLUCOSE 162*  --  118* 79  BUN 21  --  21 24*  CREATININE 1.92*  --  1.95* 2.05*  CALCIUM  8.8*  --  8.6* 8.7*   GFR: Estimated Creatinine Clearance: 26.4 mL/min (A) (by C-G formula based on SCr of 2.05 mg/dL (H)). Liver Function Tests: No results for input(s): AST, ALT, ALKPHOS, BILITOT, PROT, ALBUMIN  in the last 168 hours. CBG: Recent Labs  Lab 09/14/23 0704 09/14/23 1119 09/14/23 1600 09/14/23 2121 09/15/23 0632  GLUCAP 115* 136* 119* 109* 71  No results found for this or any previous visit (from the past 240 hours).   Radiology Studies: No results found.  Scheduled Meds:  apixaban   5 mg Oral BID   budesonide   0.25 mg Nebulization Daily   buPROPion   300 mg Oral Daily   donepezil   10 mg Oral QHS   DULoxetine   20 mg Oral Daily   feeding supplement  237 mL Oral BID BM   insulin  aspart  0-9 Units Subcutaneous TID WC   insulin  glargine-yfgn  36 Units Subcutaneous Daily   lactulose   20 g Oral Daily   linagliptin   5 mg Oral Daily   mirabegron  ER  25 mg Oral Daily   pantoprazole   40 mg Oral q AM   QUEtiapine   50 mg Oral Daily   tamsulosin   0.4 mg Oral QPC breakfast   Continuous Infusions:  LOS: 4 days  Time spent:  55min  Johnita Palleschi, DO [facility]  To contact the attending physician between 7A-7P please use Epic Chat. To contact the covering physician during after hours 7P-7A, please review Amion.   09/15/2023, 9:38 AM   *This document has been created with the assistance of dictation software. Please excuse typographical errors. *

## 2023-09-15 NOTE — Plan of Care (Signed)
Will continue to monitor.

## 2023-09-15 NOTE — TOC Progression Note (Signed)
 Transition of Care [facility]) - Progression Note    Patient Details  Name: Fernando Hess MRN: [id number] Date of Birth: 10-08-1945  [facility]) CM/SW Contact  [name], [name] Phone Number: 09/15/2023, 3:57 PM  Clinical Narrative:   CSW following for disposition. Patient received bed offer from [facility]. CSW met with patient and updated, he said he didn't want to go to [place]. CSW reiterated that him and his wife yesterday had said either [place] or [place], and [facility] said no, and patient told CSW to call his wife. CSW attempted to reach patient's wife, [name], left a voicemail to discuss. CSW to follow.    Expected Discharge Plan: Skilled Nursing Facility Barriers to Discharge: Continued Medical Work up, English As A Second Language Teacher  Expected Discharge Plan and Services     Post Acute Care Choice: Skilled Nursing Facility Living arrangements for the past 2 months: Single Family Home                                       Social Determinants of Health (SDOH) Interventions SDOH Screenings   Food Insecurity: No Food Insecurity (09/12/2023)  Housing: Low Risk  (09/12/2023)  Transportation Needs: No Transportation Needs (09/12/2023)  Utilities: Not At Risk (09/12/2023)  Depression (PHQ2-9): Low Risk  (05/20/2019)  Social Connections: Unknown (01/15/2023)   Received from [facility], [facility]  Tobacco Use: Low Risk  (09/11/2023)    Readmission Risk Interventions     No data to display

## 2023-09-15 NOTE — Plan of Care (Signed)

## 2023-09-15 NOTE — Telephone Encounter (Signed)
 Patient went to ED on Friday because he was having SOB. They did do a CT on 09/12/2023. She would like you to take a look at the CT and let them know your thoughts. She is concerned they will send him home today and he is still SOB.  I did tell her the hospitalist will have to put in a Pulmonary consult if she would like him to be seen by a Pulmonologist while he is admitted. I did tell her we are opened half a day today and closed tomorrow and we will respond as soon as we can.

## 2023-09-15 NOTE — Telephone Encounter (Signed)
 Patient has been in hospital since Friday afternoon for shortness of breath. Patient's wife would like to know if her husband needs medication or an appointment from his hospital x-ray(theres something in one of his lobes). She would like for Dr.Young or a pulmonary doctor to visit her husband. He may be released [phone]

## 2023-09-15 NOTE — Progress Notes (Signed)
 RT NOTE: PT performed NIF with great patient effort with following results:  NIF: greater than -40

## 2023-09-15 NOTE — Progress Notes (Signed)
NIF greater  than -40 good effort

## 2023-09-16 DIAGNOSIS — R531 Weakness: Secondary | ICD-10-CM | POA: Diagnosis not present

## 2023-09-16 LAB — GLUCOSE, CAPILLARY
Glucose-Capillary: 145 mg/dL — ABNORMAL HIGH (ref 70–99)
Glucose-Capillary: 188 mg/dL — ABNORMAL HIGH (ref 70–99)
Glucose-Capillary: 166 mg/dL — ABNORMAL HIGH (ref 70–99)
Glucose-Capillary: 115 mg/dL — ABNORMAL HIGH (ref 70–99)

## 2023-09-16 NOTE — Progress Notes (Signed)
 Patient performed NIF and VC with good effort  NIF -30  VC 1.4L

## 2023-09-16 NOTE — Progress Notes (Signed)
 Occupational Therapy Treatment Patient Details Name: Fernando Hess MRN: [id number] DOB: 1946-02-16 Today's Date: 09/16/2023   History of present illness 78 y.o. male admitted to the hospital on 09/11/23 for SOB and generalized weakness.  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 Pt with significant PMH of DM2, CKD III, CAD, CHF, dementia, PAF, ARF, NSTEMI, STEMI, CABG, R hip arthroplasty.   OT comments  Pt progressing towards goals this session, overall needing supervision- CGA for ADLs, CGA for bed mobility and CGA for transfers with RW. Pt able to walk short distance ambulation in room. Pt presenting with impairments listed below, will follow acutely. Pt with decr support at home, and has aide that comes 3 days/week. Patient will benefit from continued inpatient follow up therapy, <3 hours/day to maximize safety/ind with ADL/functional mobility.       If plan is discharge home, recommend the following:  A little help with walking and/or transfers;A lot of help with bathing/dressing/bathroom;Assistance with cooking/housework;Direct supervision/assist for medications management;Direct supervision/assist for financial management;Help with stairs or ramp for entrance;Assist for transportation   Equipment Recommendations  None recommended by OT    Recommendations for Other Services PT consult    Precautions / Restrictions Precautions Precautions: Fall Restrictions Weight Bearing Restrictions Per Provider Order: No       Mobility Bed Mobility Overal bed mobility: Needs Assistance Bed Mobility: Supine to Sit     Supine to sit: Contact guard Sit to supine: Contact guard assist        Transfers Overall transfer level: Needs assistance Equipment used: Rolling walker (2 wheels) Transfers: Sit to/from Stand, Bed to chair/wheelchair/BSC Sit to Stand: Contact guard assist                 Balance Overall balance assessment: Needs assistance Sitting-balance support: Feet  unsupported, Bilateral upper extremity supported Sitting balance-Leahy Scale: Good     Standing balance support: During functional activity, Reliant on assistive device for balance Standing balance-Leahy Scale: Fair Standing balance comment: reliant on UE support for dynamic tasks                           ADL either performed or assessed with clinical judgement   ADL Overall ADL's : Needs assistance/impaired                 Upper Body Dressing : Contact guard assist;Sitting   Lower Body Dressing: Supervision/safety Lower Body Dressing Details (indicate cue type and reason): donning tennis shoes Toilet Transfer: Contact guard assist;Ambulation;Rolling walker (2 wheels) Toilet Transfer Details (indicate cue type and reason): simulated in room/to chair                Extremity/Trunk Assessment Upper Extremity Assessment Upper Extremity Assessment: Generalized weakness   Lower Extremity Assessment Lower Extremity Assessment: Defer to PT evaluation        Vision   Additional Comments: [facility] for BADL   Perception Perception Perception: Not tested   Praxis Praxis Praxis: Not tested    Cognition Arousal: Alert Behavior During Therapy: Flat affect Overall Cognitive Status: History of cognitive impairments - at baseline                                          Exercises      Shoulder Instructions       General Comments VSS  Pertinent Vitals/ Pain       Pain Assessment Pain Assessment: No/denies pain  Home Living                                          Prior Functioning/Environment              Frequency  Min 1X/week        Progress Toward Goals  OT Goals(current goals can now be found in the care plan section)  Progress towards OT goals: Progressing toward goals  Acute Rehab OT Goals Patient Stated Goal: none stated OT Goal Formulation: With patient Time For Goal Achievement:  09/28/23 Potential to Achieve Goals: Good ADL Goals Pt Will Perform Upper Body Dressing: with supervision;sitting Pt Will Perform Lower Body Dressing: with supervision;sit to/from stand;sitting/lateral leans Pt Will Transfer to Toilet: with supervision;ambulating;regular height toilet Pt Will Perform Tub/Shower Transfer: Tub transfer;Shower transfer;with supervision;ambulating  Plan      Co-evaluation                 AM-PAC OT 6 Clicks Daily Activity     Outcome Measure   Help from another person eating meals?: A Little Help from another person taking care of personal grooming?: A Little Help from another person toileting, which includes using toliet, bedpan, or urinal?: A Lot Help from another person bathing (including washing, rinsing, drying)?: A Lot Help from another person to put on and taking off regular upper body clothing?: A Little Help from another person to put on and taking off regular lower body clothing?: A Little 6 Click Score: 16    End of Session Equipment Utilized During Treatment: Gait belt;Rolling walker (2 wheels)  OT Visit Diagnosis: Unsteadiness on feet (R26.81);Other abnormalities of gait and mobility (R26.89);Muscle weakness (generalized) (M62.81)   Activity Tolerance Patient tolerated treatment well   Patient Left with call bell/phone within reach;in chair;with chair alarm set   Nurse Communication Mobility status        Time: 8943-8887 OT Time Calculation (min): 16 min  Charges: OT General Charges $OT Visit: 1 Visit OT Treatments $Self Care/Home Management : 8-22 mins  Balraj Brayfield K, OTD, OTR/L [facility] [phone]   Laneta POUR Koonce 09/16/2023, 12:40 PM

## 2023-09-16 NOTE — Plan of Care (Signed)

## 2023-09-16 NOTE — TOC Progression Note (Signed)
 Transition of Care [facility]) - Progression Note    Patient Details  Name: Fernando Hess MRN: [id number] Date of Birth: 12/13/1945  Transition of [facility]) CM/SW Contact  Hartley KATHEE Robertson, LCSWA Phone Number: 09/16/2023, 9:33 AM  Clinical Narrative:     CSW spoke with pt's spouse Cy, she confirmed pt will be going to [place].   Expected Discharge Plan: Skilled Nursing Facility Barriers to Discharge: Continued Medical Work up, English As A Second Language Teacher  Expected Discharge Plan and Services     Post Acute Care Choice: Skilled Nursing Facility Living arrangements for the past 2 months: Single Family Home                                       Social Determinants of Health (SDOH) Interventions SDOH Screenings   Food Insecurity: No Food Insecurity (09/12/2023)  Housing: Low Risk  (09/12/2023)  Transportation Needs: No Transportation Needs (09/12/2023)  Utilities: Not At Risk (09/12/2023)  Depression (PHQ2-9): Low Risk  (05/20/2019)  Social Connections: Unknown (01/15/2023)   Received from [facility]  Tobacco Use: Low Risk  (09/11/2023)    Readmission Risk Interventions     No data to display

## 2023-09-16 NOTE — Progress Notes (Signed)
 NIF performed >-40. Good patient effort.

## 2023-09-16 NOTE — Plan of Care (Signed)
  Problem: Metabolic: Goal: Ability to maintain appropriate glucose levels will improve Outcome: Progressing   Problem: Skin Integrity: Goal: Risk for impaired skin integrity will decrease Outcome: Progressing   Problem: Education: Goal: Knowledge of General Education information will improve Description: Including pain rating scale, medication(s)/side effects and non-pharmacologic comfort measures Outcome: Progressing   Problem: Activity: Goal: Risk for activity intolerance will decrease Outcome: Progressing

## 2023-09-16 NOTE — Progress Notes (Signed)
 PROGRESS NOTE    Fernando Hess  FMW:[id number] DOB: August 22, 1946 DOA: 09/11/2023 PCP: Okey Carlin Redbird, MD    Brief Narrative:  78 y.o. male with CKD stage IIIb, CAD status post CABG, CHF, OSA, diabetes, myasthenia gravis, dementia.  Presented on 12/27 to the emergency room with shortness of breath for 2 days.  He thought it is similar to previous myasthenia gravis.  He has extensive neurological workup.  Has been referred to neurology at [facility].  NIF's in the ED have ranged from -24 to -40.  FVC is 1.25.  The patient was admitted.  CT chest demonstrated patchy left upper lobe groundglass opacities suggesting infection or inflammation.  There was recommendation to follow-up with CT in 3 months to exclude underlying malignancy.  There is also a loculated right pleural effusion, cardiomegaly, and aortopulmonary lymph node measuring up to 1 cm.  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  Annual imaging by CTA or MRI was recommended. Patient was admitted to a progressive bed.  NIF's have been followed.  Neurology was consulted and following.  Echocardiogram was obtained which revealed LVH, but preserved LV and RV systolic function.Has now been weaned entirely off oxygen.  Medically stabilized.  Waiting to go to SNF.  Subjective: Patient seen and examined.  Today without any complaints.  Waiting to go to a SNF.  Insurance authorization pending.  Assessment & Plan:   Generalized weakness/ Shortness of breath - Initially there was concern for myasthenic crisis.  Neurology was consulted.  Neurology recommended conservative management.  They will do outpatient workup.  -- His respiratory status has remained very well.  - CT chest without contrast left upper lobe groundglass opacities.  Inflammation versus infection.  Repeat CT in 3 months to ensure resolution and rule out underlying malignancy.  Trace loculated right pleural effusion.  Aortopulmonary lymph node measuring up  to 1 cm, no gross hilar adenopathy. --Patient already follows with pulmonology outpatient - Echocardiogram: EF 60 to 65%.  No regional wall motion abnormalities, mild left ventricular hypertrophy. 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  No regional wall motion abnormalities, mild left ventricular hypertrophy. - Continue gentle diuresis intermittent.   CAD involving native coronary artery of native heart without angina pectoralis - Continue home meds, does not appear he is currently on statin.     CKD stage IIIb - Baseline creatinine appears close to 2, continue to monitor with daily CMP - Renally dose when needed - Avoid nephrotoxic meds   Type 2 diabetes - Continue sliding scale insulin , basal/bolus.  Stable today.   OSA on CPAP - Continue CPAP nightly   DVT prophylaxis:  apixaban  (ELIQUIS ) tablet 5 mg   Code Status: Full code Family Communication: None at the bedside Disposition Plan: Status is: Inpatient Remains inpatient appropriate because: Medically stable.  Waiting for SNF bed.     Consultants:  Neurology  Procedures:  None  Antimicrobials:  None     Objective: Vitals:   09/16/23 0408 09/16/23 0831 09/16/23 0847 09/16/23 1124  BP: 116/67  (!) 148/77 (P) 128/85  Pulse: 61 63 67 (P) 72  Resp: 18  18  (P) 18  Temp: 97.8 F (36.6 C)  97.9 F (36.6 C) (P) 97.7 F (36.5 C)  TempSrc: Oral  Oral (P) Oral  SpO2: 96% 96% 96% (P) 98%  Weight:      Height:        Intake/Output Summary (Last 24 hours) at 09/16/2023 1213 Last data filed at 09/15/2023 1600 Gross per 24 hour  Intake 560 ml  Output 700 ml  Net -140 ml   Filed  Weights   09/11/23 1505  Weight: 74.8 kg    Examination:  General exam: Appears calm and comfortable.  Chronically sick looking.  Not in any distress. Respiratory system: No added sounds. Cardiovascular system: S1 & S2 heard, RRR.  Gastrointestinal system: Soft.  Nontender.  Bowel sound present. Central nervous system: Alert and oriented. No focal neurological deficits.  Generalized weakness. Extremities: Symmetric 5 x 5 power.   Data Reviewed: I have personally reviewed following labs and imaging studies  CBC: Recent Labs  Lab 09/11/23 1524 09/11/23 1609 09/12/23 0313 09/13/23 0457 09/14/23 0606  WBC 8.4  --  9.6 9.1 9.6  NEUTROABS 5.4  --   --  5.9 6.3  HGB 13.5 14.3 14.0 13.7 14.0  HCT 43.3 42.0 44.6 43.4 44.3  MCV 92.3  --  92.0 90.8 90.2  PLT 188  --  168 168 175   Basic Metabolic Panel: Recent Labs  Lab 09/11/23 1524 09/11/23 1609 09/12/23 0313 09/13/23 0457  NA 140 140 142 142  K 4.4 4.3 4.2 4.3  CL 106  --  107 106  CO2 26  --  27 27  GLUCOSE 162*  --  118* 79  BUN 21  --  21 24*  CREATININE 1.92*  --  1.95* 2.05*  CALCIUM  8.8*  --  8.6* 8.7*   GFR: Estimated Creatinine Clearance: 26.4 mL/min (A) (by C-G formula based on SCr of 2.05 mg/dL (H)). Liver Function Tests: No results for input(s): AST, ALT, ALKPHOS, BILITOT, PROT, ALBUMIN  in the last 168 hours. No results for input(s): LIPASE, AMYLASE in the last 168 hours. No results for input(s): AMMONIA in the last 168 hours. Coagulation Profile: No results for input(s): INR, PROTIME in the last 168 hours. Cardiac Enzymes: No results for input(s): CKTOTAL, CKMB, CKMBINDEX, TROPONINI in the last 168 hours. BNP (last 3 results) No results for input(s): PROBNP in the last 8760 hours. HbA1C: No results for input(s): HGBA1C in the last 72 hours. CBG: Recent Labs  Lab 09/15/23 1131 09/15/23 1625 09/15/23 2125 09/16/23 0632 09/16/23 1129  GLUCAP 177* 234* 159* 145*  188*   Lipid Profile: No results for input(s): CHOL, HDL, LDLCALC, TRIG, CHOLHDL, LDLDIRECT in the last 72 hours. Thyroid  Function Tests: No results for input(s): TSH, T4TOTAL, FREET4, T3FREE, THYROIDAB in the last 72 hours. Anemia Panel: No results for input(s): VITAMINB12, FOLATE, FERRITIN, TIBC, IRON, RETICCTPCT in the last 72 hours. Sepsis Labs: Recent Labs  Lab 09/12/23 1635  PROCALCITON <0.10    No results found for this or any previous visit (from the past 240 hours).       Radiology Studies: No results found.      Scheduled Meds:  apixaban   5 mg Oral BID   budesonide   0.25 mg Nebulization Daily   buPROPion   300 mg Oral Daily   donepezil   10 mg Oral QHS   DULoxetine   20 mg Oral Daily   feeding supplement  237 mL Oral BID BM   insulin  aspart  0-9 Units  Subcutaneous TID WC   insulin  glargine-yfgn  36 Units Subcutaneous Daily   lactulose   20 g Oral Daily   linagliptin   5 mg Oral Daily   mirabegron  ER  25 mg Oral Daily   pantoprazole   40 mg Oral q AM   QUEtiapine   50 mg Oral Daily   tamsulosin   0.4 mg Oral QPC breakfast   Continuous Infusions:   LOS: 1 day    Time spent: 35 minutes    Renato Applebaum, MD [facility]

## 2023-09-17 DIAGNOSIS — I5032 Chronic diastolic (congestive) heart failure: Secondary | ICD-10-CM | POA: Diagnosis not present

## 2023-09-17 DIAGNOSIS — R0602 Shortness of breath: Secondary | ICD-10-CM

## 2023-09-17 DIAGNOSIS — I480 Paroxysmal atrial fibrillation: Secondary | ICD-10-CM | POA: Diagnosis not present

## 2023-09-17 DIAGNOSIS — F0390 Unspecified dementia without behavioral disturbance: Secondary | ICD-10-CM | POA: Diagnosis not present

## 2023-09-17 DIAGNOSIS — R531 Weakness: Secondary | ICD-10-CM | POA: Diagnosis not present

## 2023-09-17 LAB — GLUCOSE, CAPILLARY
Glucose-Capillary: 100 mg/dL — ABNORMAL HIGH (ref 70–99)
Glucose-Capillary: 245 mg/dL — ABNORMAL HIGH (ref 70–99)

## 2023-09-17 MED ORDER — ENSURE ENLIVE PO LIQD: 237.0000 mL | Freq: Two times a day (BID) | ORAL | Status: DC

## 2023-09-17 MED ORDER — MELATONIN 5 MG PO TABS: 5.0000 mg | ORAL_TABLET | Freq: Every evening | ORAL | Status: DC | PRN

## 2023-09-17 MED ORDER — FUROSEMIDE 20 MG PO TABS: 20.0000 mg | ORAL_TABLET | ORAL | Status: DC

## 2023-09-17 MED ORDER — LACTULOSE 10 GM/15ML PO SOLN: 20.0000 g | Freq: Every day | ORAL | Status: DC

## 2023-09-17 NOTE — Discharge Summary (Addendum)
 Physician Discharge Summary  Fernando Hess FMW:[id number] DOB: 08/12/1946 DOA: 09/11/2023  PCP: Okey Carlin Redbird, MD  Admit date: 09/11/2023 Discharge date: 09/17/2023  Admitted From: Home  Discharge disposition: Skilled nursing facility   Recommendations for Outpatient Follow-Up:   Follow up with your primary care provider at the skilled nursing facility in 3 to 5 days. Check blood work in the next visit. Patient would benefit from a CT scan of the chest in 3 months to assess for groundglass opacities seen during hospitalization.  Recommend follow-up with primary pulmonary specialist as outpatient. Annual imaging with CTA/MRA of the thoracic aorta recommended for dilatation up to 4 cm.  Discharge Diagnosis:   Principal Problem:   Generalized weakness Active Problems:   Paroxysmal atrial fibrillation ([name])   Dementia without behavioral disturbance ([name])   Coronary artery disease involving native coronary artery of native heart without angina pectoris   Chronic diastolic CHF (congestive heart failure) ([name])   CKD stage 3b, GFR 30-44 ml/min ([name])   OSA on CPAP   Controlled type 2 diabetes mellitus without complication, without long-term current use of insulin  ([name])   SOB (shortness of breath)   Discharge Condition: Improved.  Diet recommendation:   Carbohydrate-modified.    Wound care: None.  Code status: Full.   History of Present Illness:   78 y.o. male with CKD stage IIIb, CAD status post CABG, CHF, OSA, diabetes, myasthenia gravis, dementia presented to the hospital  on 12/27  with shortness of breath for 2 days.  He thought it is similar to previous myasthenia gravis flare and had history of extensive neurological workup.  Has been referred to neurology at [facility].  NIF's in the ED have ranged from -24 to -40.  FVC is 1.25.  The patient was admitted hospital for further evaluation and treatment.   CT chest demonstrated patchy left upper lobe groundglass  opacities suggesting infection or inflammation.  There was recommendation to follow-up with CT in 3 months to exclude underlying malignancy.  There is also a loculated right pleural effusion, cardiomegaly, and aortopulmonary lymph node measuring up to 1 cm.  Patient was admitted to a progressive bed.  NIF's were followed, neurology was consulted and currently stable.  2D echocardiogram was obtained which revealed LVH, but preserved LV and RV systolic function.Has now been weaned entirely off oxygen.  Medically stabilized.  Stable for disposition to skilled nursing facility.   Hospital Course:   Following conditions were addressed during hospitalization as listed below,  Generalized weakness/ Shortness of breath Seen by neurology.  Initial thought was possible myasthenia gravis.  At this time conservative treatment undergoing.  Plan to follow-up with neurology as outpatient.  Respiratory status stable.  CT of the chest without contrast with groundglass opacities with trace loculated right effusion..  Plan for repeat CT in 3 months. Patient already follows with pulmonology outpatient.  Review of 2D Echocardiogram: EF 60 to 65%.  No regional wall motion abnormalities, mild left ventricular hypertrophy.  Currently on room air.   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  Recommendation is Follow-up annual imaging with CTA or MRA   Paroxysmal A-fib Continue Eliquis    Dementia without behavioral disturbance Continue Aricept , duloxetine , Seroquel .   Chronic diastolic heart failure 2 D Echocardiogram with LV ejection fraction of 60 to 65%.  No regional wall motion abnormalities, mild left ventricular hypertrophy.  Compensated at this time. Will change lasix  PO every other day.   CAD  involving native coronary artery of native heart without angina pectoralis Not on statin.     CKD stage IIIb Baseline creatinine of around 2.  Monitor as outpatient.    Type 2 diabetes mellitus. Received sliding scale during hospitalization.  Will resume Tradjenta  on discharge.   OSA on CPAP Continue CPAP  Disposition.  At this time, patient is stable for disposition to skilled nursing facility.  Medical Consultants:   Neurology  Procedures:    None Subjective:   Today, patient was seen and examined at bedside.  Denies any shortness of breath dyspnea has been ambulating some.  Denies any focal weakness  Discharge Exam:   Vitals:   09/17/23 0726 09/17/23 0826  BP: 130/85   Pulse: 68 63  Resp: 18 16  Temp: 98.2 F (36.8 C)   SpO2: 96% 95%   Vitals:   09/16/23 2343 09/17/23 0335 09/17/23 0726 09/17/23 0826  BP: 120/64 132/83 130/85   Pulse: 69 73 68 63  Resp: 19 17 18 16   Temp: 98.8 F (37.1 C) 97.9 F (36.6 C) 98.2 F (36.8 C)   TempSrc:  Oral Oral   SpO2: 96% 97% 96% 95%  Weight:      Height:       Body mass index is 26.63 kg/m.   General: Alert awake, elderly male, not in distress HENT: pupils equally reacting to light,  No scleral pallor or icterus noted. Oral mucosa is moist.  Chest:  Clear breath sounds.  No crackles or wheezes.  CVS: S1 &S2 heard. No murmur.  Regular rate and rhythm. Abdomen: Soft, nontender, nondistended.  Bowel sounds are heard.   Extremities: No cyanosis, clubbing or edema.  Peripheral pulses are palpable. Psych: Alert, awake and oriented, normal mood CNS:  No cranial nerve deficits.  Power equal in all extremities.   Skin: Warm and dry.  No rashes noted.  The results of significant diagnostics from this hospitalization (including imaging, microbiology, ancillary and laboratory) are listed below for reference.     Diagnostic Studies:   ECHOCARDIOGRAM COMPLETE Result Date: 09/12/2023    ECHOCARDIOGRAM REPORT   Patient Name:   Fernando Hess Date of Exam: 09/12/2023 Medical Rec #:  [id number]      Height:       66.0 in Accession #:    [id number]     Weight:       165.0 lb Date of Birth:   1946-08-30      BSA:          1.843 m Patient Age:    77 years       BP:           179/89 mmHg Patient Gender: M              HR:           73 bpm. Exam Location:  Inpatient Procedure: 2D Echo, Color Doppler and Cardiac Doppler Indications:    dyspnea  History:        Patient has prior history of Echocardiogram examinations, most                 recent 12/30/2022. CAD, Prior CABG, chronic kidney disease,                 Arrythmias:Atrial Fibrillation; Risk Factors:Sleep Apnea and                 Diabetes.  Sonographer:    Tinnie Barefoot RDCS Referring Phys: [phone] JARED M GARDNER  IMPRESSIONS  1. Left ventricular ejection fraction, by estimation, is 60 to 65%. The left ventricle has normal function. The left ventricle has no regional wall motion abnormalities. There is mild left ventricular hypertrophy. Left ventricular diastolic parameters were normal.  2. Right ventricular systolic function is normal. The right ventricular size is normal. There is normal pulmonary artery systolic pressure.  3. Left atrial size was moderately dilated.  4. The mitral valve is abnormal. Trivial mitral valve regurgitation. No evidence of mitral stenosis. Moderate mitral annular calcification.  5. The aortic valve is tricuspid. There is moderate calcification of the aortic valve. There is moderate thickening of the aortic valve. Aortic valve regurgitation is mild. Mild to moderate aortic valve stenosis.  6. The inferior vena cava is normal in size with greater than 50% respiratory variability, suggesting right atrial pressure of 3 mmHg. FINDINGS  Left Ventricle: Left ventricular ejection fraction, by estimation, is 60 to 65%. The left ventricle has normal function. The left ventricle has no regional wall motion abnormalities. The left ventricular internal cavity size was normal in size. There is  mild left ventricular hypertrophy. Left ventricular diastolic parameters were normal. Right Ventricle: The right ventricular size is normal.  No increase in right ventricular wall thickness. Right ventricular systolic function is normal. There is normal pulmonary artery systolic pressure. The tricuspid regurgitant velocity is 2.36 m/s, and  with an assumed right atrial pressure of 3 mmHg, the estimated right ventricular systolic pressure is 25.3 mmHg. Left Atrium: Left atrial size was moderately dilated. Right Atrium: Right atrial size was normal in size. Pericardium: There is no evidence of pericardial effusion. Mitral Valve: The mitral valve is abnormal. Moderate mitral annular calcification. Trivial mitral valve regurgitation. No evidence of mitral valve stenosis. Tricuspid Valve: The tricuspid valve is normal in structure. Tricuspid valve regurgitation is mild . No evidence of tricuspid stenosis. Aortic Valve: The aortic valve is tricuspid. There is moderate calcification of the aortic valve. There is moderate thickening of the aortic valve. Aortic valve regurgitation is mild. Mild to moderate aortic stenosis is present. Aortic valve mean gradient measures 9.0 mmHg. Aortic valve peak gradient measures 16.8 mmHg. Aortic valve area, by VTI measures 0.95 cm. Pulmonic Valve: The pulmonic valve was normal in structure. Pulmonic valve regurgitation is not visualized. No evidence of pulmonic stenosis. Aorta: The aortic root is normal in size and structure. Venous: The inferior vena cava is normal in size with greater than 50% respiratory variability, suggesting right atrial pressure of 3 mmHg. IAS/Shunts: No atrial level shunt detected by color flow Doppler.  LEFT VENTRICLE PLAX 2D LVIDd:         4.50 cm LVIDs:         3.30 cm LV PW:         1.20 cm LV IVS:        1.20 cm LVOT diam:     1.70 cm LV SV:         31 LV SV Index:   17 LVOT Area:     2.27 cm  RIGHT VENTRICLE          IVC RV Basal diam:  2.40 cm  IVC diam: 1.60 cm LEFT ATRIUM             Index        RIGHT ATRIUM           Index LA diam:        4.30 cm 2.33 cm/m   RA Area:  13.60 cm LA Vol  (A2C):   60.0 ml 32.56 ml/m  RA Volume:   28.80 ml  15.63 ml/m LA Vol (A4C):   66.0 ml 35.81 ml/m LA Biplane Vol: 65.2 ml 35.38 ml/m  AORTIC VALVE AV Area (Vmax):    0.87 cm AV Area (Vmean):   0.83 cm AV Area (VTI):     0.95 cm AV Vmax:           205.00 cm/s AV Vmean:          138.000 cm/s AV VTI:            0.328 m AV Peak Grad:      16.8 mmHg AV Mean Grad:      9.0 mmHg LVOT Vmax:         78.45 cm/s LVOT Vmean:        50.400 cm/s LVOT VTI:          0.138 m LVOT/AV VTI ratio: 0.42  AORTA Ao Root diam: 3.30 cm Ao Asc diam:  3.50 cm TRICUSPID VALVE TR Peak grad:   22.3 mmHg TR Vmax:        236.00 cm/s  SHUNTS Systemic VTI:  0.14 m Systemic Diam: 1.70 cm Maude Emmer MD Electronically signed by Maude Emmer MD Signature Date/Time: 09/12/2023/12:11:49 PM    Final    CT CHEST WO CONTRAST Result Date: 09/12/2023 CLINICAL DATA:  Dyspnea, chronic, unclear etiology EXAM: CT CHEST WITHOUT CONTRAST TECHNIQUE: Multidetector CT imaging of the chest was performed following the standard protocol without IV contrast. RADIATION DOSE REDUCTION: This exam was performed according to the departmental dose-optimization program which includes automated exposure control, adjustment of the mA and/or kV according to patient size and/or use of iterative reconstruction technique. COMPARISON:  CT chest 12/29/2022 trauma chest x-ray 07/15/2023 FINDINGS: Cardiovascular: Enlarged heart size. No significant pericardial effusion. 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 Mild atherosclerotic plaque of the thoracic aorta. Aortic valve leaflet calcification. Four-vessel coronary calcifications status post coronary artery bypass graft. Mediastinum/Nodes: No gross hilar adenopathy, noting limited sensitivity for the detection of hilar adenopathy on this noncontrast study. Upper limits of normal aortopulmonary lymph node measuring up to 1 cm. No enlarged axillary lymph nodes. Thyroid  gland, trachea, and esophagus demonstrate no  significant findings. Lungs/Pleura: Diffuse bronchial wall thickening. Patchy left upper lobe ground-glass airspace opacities. Subpleural triangular pulmonary nodule along the right major fissure likely represents an intrapulmonary lymph node-no further follow-up indicated. Bilateral lower lobe atelectasis. No pulmonary nodule. No pulmonary mass. Trace loculated right pleural effusion. No pneumothorax. Upper Abdomen: No acute abnormality. Musculoskeletal: No chest wall abnormality.  Bilateral gynecomastia. No suspicious lytic or blastic osseous lesions. No acute displaced fracture. Multilevel degenerative changes of the spine. Intact sternotomy wires. IMPRESSION: 1. Patchy left upper lobe ground-glass airspace opacities. Findings suggest infection/inflammation. Recommend follow-up CT in 3 months to evaluate for complete resolution exclude underlying malignancy such as adenocarcinoma. 2. Trace loculated right pleural effusion. 3. Cardiomegaly. 4. Upper limits of normal aortopulmonary lymph node measuring up to 1 cm. Recommend attention on follow-up. No gross hilar adenopathy, noting limited sensitivity for the detection of hilar adenopathy on this noncontrast study. 5. 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 Recommend annual imaging followup by CTA or MRA. This recommendation follows 2010 ACCF/AHA/AATS/ACR/ASA/SCA/SCAI/SIR/STS/SVM Guidelines for the Diagnosis and Management of Patients with Thoracic Aortic Disease. Circulation. 2010; 121: Z733-z630. Aortic aneurysm NOS (ICD10-I71.9). Electronically Signed   By: Morgane  Naveau M.D.   On: 09/12/2023 01:13  DG Chest Port 1 View Result Date: 09/11/2023 CLINICAL DATA:  Shortness of breath. EXAM: PORTABLE CHEST 1 VIEW COMPARISON:  Chest radiograph dated July 15, 2023. FINDINGS: Low lung volumes. The heart size and mediastinal contours are unchanged. Prior median sternotomy and CABG. Bilateral interstitial prominence. No focal consolidation,  sizeable pleural effusion, or pneumothorax. No acute osseous abnormality. IMPRESSION: Low lung volumes. Mild cardiomegaly with findings suggestive of pulmonary vascular congestion. Electronically Signed   By: Harrietta Sherry M.D.   On: 09/11/2023 16:11     Labs:   Basic Metabolic Panel: Recent Labs  Lab 09/11/23 1524 09/11/23 1609 09/12/23 0313 09/13/23 0457  NA 140 140 142 142  K 4.4 4.3 4.2 4.3  CL 106  --  107 106  CO2 26  --  27 27  GLUCOSE 162*  --  118* 79  BUN 21  --  21 24*  CREATININE 1.92*  --  1.95* 2.05*  CALCIUM  8.8*  --  8.6* 8.7*   GFR Estimated Creatinine Clearance: 26.4 mL/min (A) (by C-G formula based on SCr of 2.05 mg/dL (H)). Liver Function Tests: No results for input(s): AST, ALT, ALKPHOS, BILITOT, PROT, ALBUMIN  in the last 168 hours. No results for input(s): LIPASE, AMYLASE in the last 168 hours. No results for input(s): AMMONIA in the last 168 hours. Coagulation profile No results for input(s): INR, PROTIME in the last 168 hours.  CBC: Recent Labs  Lab 09/11/23 1524 09/11/23 1609 09/12/23 0313 09/13/23 0457 09/14/23 0606  WBC 8.4  --  9.6 9.1 9.6  NEUTROABS 5.4  --   --  5.9 6.3  HGB 13.5 14.3 14.0 13.7 14.0  HCT 43.3 42.0 44.6 43.4 44.3  MCV 92.3  --  92.0 90.8 90.2  PLT 188  --  168 168 175   Cardiac Enzymes: No results for input(s): CKTOTAL, CKMB, CKMBINDEX, TROPONINI in the last 168 hours. BNP: Invalid input(s): POCBNP CBG: Recent Labs  Lab 09/16/23 0632 09/16/23 1129 09/16/23 1627 09/16/23 2131 09/17/23 0604  GLUCAP 145* 188* 166* 115* 100*   D-Dimer No results for input(s): DDIMER in the last 72 hours. Hgb A1c No results for input(s): HGBA1C in the last 72 hours. Lipid Profile No results for input(s): CHOL, HDL, LDLCALC, TRIG, CHOLHDL, LDLDIRECT in the last 72 hours. Thyroid  function studies No results for input(s): TSH, T4TOTAL, T3FREE, THYROIDAB in the last 72  hours.  Invalid input(s): FREET3 Anemia work up No results for input(s): VITAMINB12, FOLATE, FERRITIN, TIBC, IRON, RETICCTPCT in the last 72 hours. Microbiology No results found for this or any previous visit (from the past 240 hours).   Discharge Instructions:   Discharge Instructions     Diet Carb Modified   Complete by: As directed    Discharge instructions   Complete by: As directed    Follow-up with your primary care provider at the skilled nursing facility in 3 to 5 days.  Check blood work at that time.   Increase activity slowly   Complete by: As directed       Allergies as of 09/17/2023       Reactions   Cilostazol  Swelling, Other (See Comments)   Edema   Dulaglutide Nausea And Vomiting, Other (See Comments)   TRULICITY   Levofloxacin Hives, Itching, Rash   Liraglutide Other (See Comments)   Severe fatigue & insomnia   Lisinopril Itching, Rash, Cough        Medication List     TAKE these medications    acetaminophen  500  MG tablet Commonly known as: TYLENOL  Take 1,000 mg by mouth every 6 (six) hours as needed for moderate pain.   apixaban  5 MG Tabs tablet Commonly known as: ELIQUIS  Take 1 tablet (5 mg total) by mouth 2 (two) times daily.   ascorbic acid  500 MG tablet Commonly known as: VITAMIN C  Take 1 tablet (500 mg total) by mouth daily.   budesonide  0.25 MG/2ML nebulizer solution Commonly known as: PULMICORT  Take 0.25 mg by nebulization daily.   buPROPion  300 MG 24 hr tablet Commonly known as: WELLBUTRIN  XL Take 300 mg by mouth daily.   cyanocobalamin  1000 MCG/ML injection Commonly known as: VITAMIN B12 Inject 1,000 mcg into the muscle once a week. saturdays   donepezil  10 MG tablet Commonly known as: ARICEPT  Take 10 mg by mouth at bedtime.   DULoxetine  20 MG capsule Commonly known as: CYMBALTA  Take 20 mg by mouth daily.   feeding supplement Liqd Take 237 mLs by mouth 2 (two) times daily between meals.   furosemide  20  MG tablet Commonly known as: LASIX  Take 20 mg by mouth daily as needed for fluid or edema.   Gvoke HypoPen 2-Pack 1 MG/0.2ML Soaj Generic drug: Glucagon Inject into the skin.   hydrALAZINE  25 MG tablet Commonly known as: APRESOLINE  Take 1 tablet (25 mg total) by mouth every 6 (six) hours as needed (SBP>160 or DBP>110).   insulin  lispro 100 UNIT/ML KwikPen Commonly known as: HUMALOG  Inject 5 Units into the skin 3 (three) times daily.   lactulose  10 GM/15ML solution Commonly known as: CHRONULAC  Take 30 mLs (20 g total) by mouth daily. Start taking on: September 18, 2023   Lantus  SoloStar 100 UNIT/ML Solostar Pen Generic drug: insulin  glargine Inject 36 Units into the skin daily.   melatonin 5 MG Tabs Take 1 tablet (5 mg total) by mouth at bedtime as needed.   Myrbetriq  25 MG Tb24 tablet Generic drug: mirabegron  ER Take 25 mg by mouth daily.   nitroGLYCERIN  0.4 MG SL tablet Commonly known as: NITROSTAT  Place 0.4 mg under the tongue every 5 (five) minutes as needed for chest pain.   ondansetron  4 MG disintegrating tablet Commonly known as: ZOFRAN -ODT Take 4 mg by mouth every 8 (eight) hours as needed for nausea or vomiting.   pantoprazole  40 MG tablet Commonly known as: PROTONIX  TAKE 1 TABLET BY MOUTH EVERY DAY What changed: when to take this   Pro-Stat Liqd Take 30 mLs by mouth in the morning and at bedtime.   QUEtiapine  50 MG tablet Commonly known as: SEROQUEL  Take 50 mg by mouth daily.   senna-docusate 8.6-50 MG tablet Commonly known as: Senokot-S Take 1 tablet by mouth 2 (two) times daily as needed for moderate constipation.   tamsulosin  0.4 MG Caps capsule Commonly known as: FLOMAX  Take 1 capsule (0.4 mg total) by mouth daily after breakfast.   tiZANidine  2 MG tablet Commonly known as: ZANAFLEX  Take 2 mg by mouth as needed for muscle spasms.   Tradjenta  5 MG Tabs tablet Generic drug: linagliptin  Take 5 mg by mouth daily.          Time  coordinating discharge: 39 minutes  Signed:  Babe Clenney  [facility] 09/17/2023, 12:12 PM

## 2023-09-17 NOTE — Progress Notes (Signed)
 Pt performed NIF and VC with good effort.  NIF -30 VC 1.8L

## 2023-09-17 NOTE — TOC Transition Note (Signed)
 Transition of Care [facility]) - Discharge Note   Patient Details  Name: Fernando Hess MRN: [id number] Date of Birth: 12-Aug-1946  [facility] CM/SW Contact:  [name], LCSW Phone Number: 09/17/2023, 1:55 PM   Clinical Narrative:   CSW initiated insurance authorization for patient to admit to SNF, and authorization was approved. CSW updated [facility], confirmed they have a bed available for the patient. CSW updated MD and patient is stable for discharge today. CSW sent discharge information to [facility]. Transport arranged with [facility] for next available.  Multiple calls also with spouse, [name]: to discuss concerns about when he gets home and trying to find him a hospital bed; to discuss concerns related to his down mood today and wanting someone to be able to cheer him up; to discuss questions about whether a pulmonologist has seen him or not, that she hasn't been able to speak with a doctor. CSW relayed message to MD that the spouse would like a call to discuss her questions. CSW contacted patient's home health agency, [facility], to relay concerns that the wife had about trying to find him a hospital bed prior to return home. CSW met with patient to talk to him and offer a snack per spouse request, patient was not interested. Spouse appreciative of CSW assistance.  Nurse to call report to [phone], Room 216.    Final next level of care: Skilled Nursing Facility Barriers to Discharge: Barriers Resolved   Patient Goals and CMS Choice Patient states their goals for this hospitalization and ongoing recovery are:: to get back home CMS Medicare.gov Compare Post Acute Care list provided to:: Patient Choice offered to / list presented to : Patient [place] ownership interest in [facility].provided to:: Patient    Discharge Placement              Patient chooses bed at: [facility] Patient to be transferred to facility by: [facility] Name of  family member notified: [name] Patient and family notified of of transfer: 09/17/23  Discharge Plan and Services Additional resources added to the After Visit Summary for       Post Acute Care Choice: Skilled Nursing Facility                               Social Drivers of Health (SDOH) Interventions SDOH Screenings   Food Insecurity: No Food Insecurity (09/12/2023)  Housing: Low Risk  (09/12/2023)  Transportation Needs: No Transportation Needs (09/12/2023)  Utilities: Not At Risk (09/12/2023)  Depression (PHQ2-9): Low Risk  (05/20/2019)  Social Connections: Unknown (01/15/2023)   Received from [facility], [facility]  Tobacco Use: Low Risk  (09/11/2023)     Readmission Risk Interventions     No data to display

## 2023-09-26 NOTE — Progress Notes (Signed)
 HPI M never smoker followed for OSA, complicated by CAD/ MI/ RBBB/ CABG, PAD, HTN, dCHF, OSA, DM2/ retinopathy, CKD3, Myasthenia Gravis,  ENT/ [facility]- Septoplasty/ turbinate reduction 08/30/21. NPSG ([facility]) 11/25/18- AHI 37.9/ hr, desaturation to 79%, body weight 201 lbs Walk Test Room Air 06/18/21- 3 Steady laps- lowest O2 sat 95%, max HR 103. PFT 04/21/22- Moderate Obstruction, weak effort. Per Tech-Patient refused DLCP/ Pleth today CPET 11/20/21- normal with exercise limitedd by deconditioning and body habitus ================================================================  06/10/22- [name] never smoker followed for OSA, complicated by CAD/ MI/ RBBB/ CABG/ AFib, PAD, HTN, dCHF,  DM2/ retinopathy, CKD3, Myasthenia Gravis -[name]  5, Melatonin, Cymbalta , Albuterol  hfa,  CPAP- auto 5-15 / [employer]        AirSense 10 AutoSet                           Wife here Download compliance- 14%, AHI 2.8/hr Body weight today-  184 lbs                             Covid vax-3 Phizer He got concerned that the Respironics recall and questions about Ozone cleaning devices might somehow also relate to his ResMed machine and might aggravate his Myasthenia Gravis. I told him I know of no such connection. He is going to resume use of his machine. We are out of senior strength flu vax- he chose to get standard flu vax today since here. Pulse IRR on palpation today. Wife confirms he has hx AFib/ Eliquis . CT chest 05/01/22- IMPRESSION: No acute pulmonary abnormality.  09/28/23- [name] never smoker followed for OSA/quit CPAP, complicated by CAD/ MI/ RBBB/ CABG/ AFib, PAD, HTN, dCHF,  DM2/ retinopathy, CKD3, Myasthenia Gravis, Dementia, -[name]  5, Melatonin, Cymbalta , Albuterol  hfa,  CPAP- auto 5-15 / [employer]        AirSense 10 AutoSet                           Wife Cy here Download compliance-  Body weight today-  182 lbs Hosp f/u- recent hosp for generalized weakness, possibly due to Myasthenia Gravis, seen by Neurology.   Had abnormal CT for f/u. He says cough never productive and had no fever. Today he is in wheelchair, but says breathing much better, strength better. Says new CPAP was sent, but he never felt he could breathe with it and stopped using it a year ago without calling us . CT chest 09/12/23- done for dyspnea IMPRESSION: 1. Patchy left upper lobe ground-glass airspace opacities. Findings suggest infection/inflammation. Recommend follow-up CT in 3 months to evaluate for complete resolution exclude underlying malignancy such as adenocarcinoma. 2. Trace loculated right pleural effusion. 3. Cardiomegaly. 4. Upper limits of normal aortopulmonary lymph node measuring up to 1 cm. Recommend attention on follow-up. No gross hilar adenopathy, noting limited sensitivity for the detection of hilar adenopathy on this noncontrast study. 5. 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 Recommend annual imaging followup by CTA or MRA. This recommendation follows 2010 ACCF/AHA/AATS/ACR/ASA/SCA/SCAI/SIR/STS/SVM Guidelines for the Diagnosis and Management of Patients with Thoracic Aortic Disease. Circulation. 2010; 121: Z733-z630. Aortic aneurysm NOS (ICD10-I71.9).  Discussed the use of AI scribe software for clinical note transcription with the patient, who gave verbal consent to proceed.  History of Present Illness   The patient, with a history of wheelchair dependence, was recently hospitalized due to severe weakness and difficulty breathing.  The spouse reports that the patient was unable to lift himself out of the wheelchair, a task he is usually able to perform. The patient also experienced shallow, panting breaths for approximately forty hours, preventing sleep. The patient's breathing has since improved, and he is now able to stand up.  During the hospitalization, a CT scan revealed patchy left upper lobe ground glass shadowing in the lung, which the radiologist suggested could be due to  infection, inflammation, or possibly a hidden cancer. The patient denies coughing up anything during this illness and denies fever.  The patient also has a history of using CPAP for sleep apnea, but has not used it for over a year due to issues with adjusting a new machine. The patient uses a fan when sleeping, which he reports helps him.     ROS-see HPI   + = positive Constitutional:    weight loss, night sweats, fevers, chills, fatigue, lassitude. HEENT:    headaches, difficulty swallowing, tooth/dental problems, sore throat,       sneezing, itching, ear ache, nasal congestion, post nasal drip, snoring CV:    chest pain, orthopnea, PND, swelling in lower extremities, anasarca,                                dizziness, palpitations Resp:   +shortness of breath with exertion or at rest.                productive cough,   non-productive cough, coughing up of blood.              change in color of mucus.  wheezing.   Skin:    rash or lesions. GI:  No-   heartburn, indigestion, abdominal pain, nausea, vomiting, diarrhea,                 change in bowel habits, loss of appetite GU: dysuria, change in color of urine, no urgency or frequency.   flank pain. MS:   joint pain, stiffness, decreased range of motion, back pain. Neuro-     nothing unusual Psych:  change in mood or affect.  depression or anxiety.   memory loss.   OBJ- Physical Exam     Unlabored General- Alert, Oriented, Affect-appropriate, Distress- none acute, +obese Skin- rash-none, lesions- none, excoriation- none Lymphadenopathy- none Head- atraumatic            Eyes- Gross vision intact, PERRLA, conjunctivae and secretions clear            Ears- Hearing, canals-normal            Nose- Clear, no-Septal dev, mucus, polyps, erosion, perforation             Throat- Mallampati III , mucosa clear , drainage- none, tonsils- atrophic,  +teeth/ some repair,  Neck- flexible , trachea midline, no stridor , thyroid  nl, carotid no  bruit Chest - symmetrical excursion , unlabored           Heart/CV- IRR/ AFib , no murmur , no gallop  , no rub, nl s1 s2                           - JVD- none , edema- none, stasis changes- none, varices- none           Lung- clear to P&A, wheeze- none, cough- none , dullness-none, rub- none  Chest wall-  Abd-  Br/ Gen/ Rectal- Not done, not indicated Extrem- cyanosis- none, clubbing, none, atrophy- none, strength- nl Neuro- grossly intact to observation  Assessment and Plan    Respiratory Distress Recent hospitalization for severe shortness of breath and weakness. CT scan showed patchy left upper lobe ground glass opacities, possibly pneumonia. Currently improved with no cough, fever, or sputum production. Lung sounds clear on examination. -Order chest x-ray today to assess for resolution of pneumonia. -Repeat CT scan in 3 months as recommended by radiologist to rule out hidden malignancy.  Sleep Apnea Not using CPAP for over a year due to issues with new machine. -Order overnight oximetry to assess for nocturnal hypoxemia. -Consider new sleep study if oximetry suggests low oxygen levels.  Follow-up appointment scheduled for October 27, 2023.

## 2023-09-28 ENCOUNTER — Ambulatory Visit: Payer: Medicare Other | Admitting: Internal Medicine

## 2023-09-28 ENCOUNTER — Encounter: Payer: Self-pay | Admitting: Internal Medicine

## 2023-09-28 ENCOUNTER — Ambulatory Visit (INDEPENDENT_AMBULATORY_CARE_PROVIDER_SITE_OTHER): Payer: Medicare Other

## 2023-09-28 VITALS — BP 138/70 | HR 75 | Ht 66.0 in | Wt 182.0 lb

## 2023-09-28 DIAGNOSIS — R0600 Dyspnea, unspecified: Secondary | ICD-10-CM | POA: Diagnosis not present

## 2023-09-28 DIAGNOSIS — J189 Pneumonia, unspecified organism: Secondary | ICD-10-CM

## 2023-09-28 DIAGNOSIS — G4734 Idiopathic sleep related nonobstructive alveolar hypoventilation: Secondary | ICD-10-CM

## 2023-09-28 DIAGNOSIS — R9389 Abnormal findings on diagnostic imaging of other specified body structures: Secondary | ICD-10-CM | POA: Diagnosis not present

## 2023-09-28 NOTE — Patient Instructions (Addendum)
 Order CXR   dx pneumonia, abnormal CT scan  Order- overnight oximetry   dx nocturnal hypoxemia  Keep appointment for February 11

## 2023-09-30 ENCOUNTER — Other Ambulatory Visit: Payer: Self-pay

## 2023-09-30 DIAGNOSIS — G4734 Idiopathic sleep related nonobstructive alveolar hypoventilation: Secondary | ICD-10-CM

## 2023-10-02 ENCOUNTER — Other Ambulatory Visit (HOSPITAL_COMMUNITY): Payer: Self-pay

## 2023-10-06 ENCOUNTER — Encounter: Payer: Self-pay | Admitting: *Deleted

## 2023-10-06 ENCOUNTER — Ambulatory Visit: Payer: Medicare Other | Admitting: Physical Therapy

## 2023-10-25 NOTE — Progress Notes (Deleted)
 HPI M never smoker followed for OSA, complicated by CAD/ MI/ RBBB/ CABG, PAD, HTN, dCHF, OSA, DM2/ retinopathy, CKD3, Myasthenia Gravis,  ENT/ [facility]- Septoplasty/ turbinate reduction 08/30/21. NPSG ([facility]) 11/25/18- AHI 37.9/ hr, desaturation to 79%, body weight 201 lbs Walk Test Room Air 06/18/21- 3 Steady laps- lowest O2 sat 95%, max HR 103. PFT 04/21/22- Moderate Obstruction, weak effort. Per Tech-Patient refused DLCP/ Pleth today CPET 11/20/21- normal with exercise limitedd by deconditioning and body habitus ================================================================   09/28/23- [name] never smoker followed for OSA/quit CPAP, complicated by CAD/ MI/ RBBB/ CABG/ AFib, PAD, HTN, dCHF,  DM2/ retinopathy, CKD3, Myasthenia Gravis, Dementia, -[name] 5, Melatonin, Cymbalta, Albuterol hfa,  CPAP- auto 5-15 / [employer]        AirSense 10 AutoSet                           Wife Carney Bern here Download compliance-  Body weight today-  182 lbs Hosp f/u- recent hosp for generalized weakness, possibly due to Myasthenia Gravis, seen by Neurology.  Had abnormal CT for f/u. He says cough never productive and had no fever. Today he is in wheelchair, but says breathing much better, strength better. Says new CPAP was sent, but he never felt he could breathe with it and stopped using it a year ago without calling us. CT chest 09/12/23- done for dyspnea IMPRESSION: 1. Patchy left upper lobe ground-glass airspace opacities. Findings suggest infection/inflammation. Recommend follow-up CT in 3 months to evaluate for complete resolution exclude underlying malignancy such as adenocarcinoma. 2. Trace loculated right pleural effusion. 3. Cardiomegaly. 4. Upper limits of normal aortopulmonary lymph node measuring up to 1 cm. Recommend attention on follow-up. No gross hilar adenopathy, noting limited sensitivity for the detection of hilar adenopathy on this noncontrast study. 5. Ascending thoracic aorta enlarged measuring  up to 4 cm. Recommend annual imaging followup by CTA or MRA. This recommendation follows 2010 ACCF/AHA/AATS/ACR/ASA/SCA/SCAI/SIR/STS/SVM Guidelines for the Diagnosis and Management of Patients with Thoracic Aortic Disease. Circulation. 2010; 121: [id number]. Aortic aneurysm NOS (ICD10-I71.9).  Discussed the use of AI scribe software for clinical note transcription with the patient, who gave verbal consent to proceed.  History of Present Illness   The patient, with a history of wheelchair dependence, was recently hospitalized due to severe weakness and difficulty breathing. The spouse reports that the patient was unable to lift himself out of the wheelchair, a task he is usually able to perform. The patient also experienced shallow, panting breaths for approximately forty hours, preventing sleep. The patient's breathing has since improved, and he is now able to stand up.  During the hospitalization, a CT scan revealed patchy left upper lobe ground glass shadowing in the lung, which the radiologist suggested could be due to infection, inflammation, or possibly a hidden cancer. The patient denies coughing up anything during this illness and denies fever.  The patient also has a history of using CPAP for sleep apnea, but has not used it for over a year due to issues with adjusting a new machine. The patient uses a fan when sleeping, which he reports helps him.     ROS-see HPI   + = positive Constitutional:    weight loss, night sweats, fevers, chills, fatigue, lassitude. HEENT:    headaches, difficulty swallowing, tooth/dental problems, sore throat,       sneezing, itching, ear ache, nasal congestion, post nasal drip, snoring CV:    chest pain, orthopnea, PND, swelling in lower extremities,  anasarca,                                dizziness, palpitations Resp:   +shortness of breath with exertion or at rest.                productive cough,   non-productive cough, coughing up of blood.               change in color of mucus.  wheezing.   Skin:    rash or lesions. GI:  No-   heartburn, indigestion, abdominal pain, nausea, vomiting, diarrhea,                 change in bowel habits, loss of appetite GU: dysuria, change in color of urine, no urgency or frequency.   flank pain. MS:   joint pain, stiffness, decreased range of motion, back pain. Neuro-     nothing unusual Psych:  change in mood or affect.  depression or anxiety.   memory loss.   OBJ- Physical Exam     Unlabored General- Alert, Oriented, Affect-appropriate, Distress- none acute, +obese Skin- rash-none, lesions- none, excoriation- none Lymphadenopathy- none Head- atraumatic            Eyes- Gross vision intact, PERRLA, conjunctivae and secretions clear            Ears- Hearing, canals-normal            Nose- Clear, no-Septal dev, mucus, polyps, erosion, perforation             Throat- Mallampati III , mucosa clear , drainage- none, tonsils- atrophic,  +teeth/ some repair,  Neck- flexible , trachea midline, no stridor , thyroid nl, carotid no bruit Chest - symmetrical excursion , unlabored           Heart/CV- IRR/ AFib , no murmur , no gallop  , no rub, nl s1 s2                           - JVD- none , edema- none, stasis changes- none, varices- none           Lung- clear to P&A, wheeze- none, cough- none , dullness-none, rub- none           Chest wall-  Abd-  Br/ Gen/ Rectal- Not done, not indicated Extrem- cyanosis- none, clubbing, none, atrophy- none, strength- nl Neuro- grossly intact to observation  Assessment and Plan    Respiratory Distress Recent hospitalization for severe shortness of breath and weakness. CT scan showed patchy left upper lobe ground glass opacities, possibly pneumonia. Currently improved with no cough, fever, or sputum production. Lung sounds clear on examination. -Order chest x-ray today to assess for resolution of pneumonia. -Repeat CT scan in 3 months as recommended by radiologist to rule  out hidden malignancy.  Sleep Apnea Not using CPAP for over a year due to issues with new machine. -Order overnight oximetry to assess for nocturnal hypoxemia. -Consider new sleep study if oximetry suggests low oxygen levels.  Follow-up appointment scheduled for October 27, 2023.   10/27/23- 77yoM never smoker followed for OSA/quit CPAP, complicated by CAD/ MI/ RBBB/ CABG/ AFib, PAD, HTN, dCHF,  DM2/ retinopathy, CKD3, Myasthenia Gravis, Dementia, -[name] 5, Melatonin, Cymbalta, Albuterol hfa,  CPAP- auto 5-15 / [employer]        AirSense 10 AutoSet  Wife Carney Bern here Download compliance-  Body weight today-   CXR 09/28/23- MPRESSION: 1. Interval improvement. Previously noted opacities in the left upper lobe have mostly resolved. 2. No new abnormalities.  No acute findings.     ROS-see HPI   + = positive Constitutional:    weight loss, night sweats, fevers, chills, fatigue, lassitude. HEENT:    headaches, difficulty swallowing, tooth/dental problems, sore throat,       sneezing, itching, ear ache, nasal congestion, post nasal drip, snoring CV:    chest pain, orthopnea, PND, swelling in lower extremities, anasarca,                                dizziness, palpitations Resp:   +shortness of breath with exertion or at rest.                productive cough,   non-productive cough, coughing up of blood.              change in color of mucus.  wheezing.   Skin:    rash or lesions. GI:  No-   heartburn, indigestion, abdominal pain, nausea, vomiting, diarrhea,                 change in bowel habits, loss of appetite GU: dysuria, change in color of urine, no urgency or frequency.   flank pain. MS:   joint pain, stiffness, decreased range of motion, back pain. Neuro-     nothing unusual Psych:  change in mood or affect.  depression or anxiety.   memory loss.   OBJ- Physical Exam     Unlabored General- Alert, Oriented, Affect-appropriate, Distress- none acute,  +obese Skin- rash-none, lesions- none, excoriation- none Lymphadenopathy- none Head- atraumatic            Eyes- Gross vision intact, PERRLA, conjunctivae and secretions clear            Ears- Hearing, canals-normal            Nose- Clear, no-Septal dev, mucus, polyps, erosion, perforation             Throat- Mallampati III , mucosa clear , drainage- none, tonsils- atrophic,  +teeth/ some repair,  Neck- flexible , trachea midline, no stridor , thyroid nl, carotid no bruit Chest - symmetrical excursion , unlabored           Heart/CV- IRR/ AFib , no murmur , no gallop  , no rub, nl s1 s2                           - JVD- none , edema- none, stasis changes- none, varices- none           Lung- clear to P&A, wheeze- none, cough- none , dullness-none, rub- none           Chest wall-  Abd-  Br/ Gen/ Rectal- Not done, not indicated Extrem- cyanosis- none, clubbing, none, atrophy- none, strength- nl Neuro- grossly intact to observation

## 2023-10-27 ENCOUNTER — Ambulatory Visit: Payer: Medicare Other | Admitting: Internal Medicine

## 2023-11-20 ENCOUNTER — Ambulatory Visit: Payer: Medicare Other | Admitting: Physician Assistant

## 2023-12-06 NOTE — Progress Notes (Deleted)
 {This patient may be at risk for Amyloid. He has one or more dx on the problem list or PMH from the following list - Abnormal EKG, CHF, Aortic Stenosis, Proteinuria, LVH, Carpal Tunnel Syndrome, Biceps Tendon Rupture, Syncope. See list below or review PMH.  Diagnoses From Problem List           Noted     Chronic diastolic CHF (congestive heart failure) ([name]) 11/18/2019     Mild aortic stenosis 11/05/2021     Prolonged QT interval 02/03/2023     Syncope 05/10/2021    Click HERE to open Cardiac Amyloid Screening SmartSet to order screening OR Click HERE to defer testing for 1 year or permanently :1}    Cardiology Office Note:    Date:  12/06/2023  ID:  Fernando Hess, DOB [birth year], MRN [id number] PCP: Fernando Floro, MD  [facility] Providers Cardiologist:  Fernando Sprague, MD (Inactive) Cardiology APP:  Fernando Lecher, [name]  Electrophysiologist:  Fernando Prude, MD { Click to update primary MD,subspecialty MD or APP then REFRESH:1}    {Click to Open Review  :1}   Patient Profile:      Coronary artery disease S/p MI in 09/2015 >> s/p CABG S/p inferolateral STEMI 2/17 >> S-OM1 and S-OM2 occluded; dOM occluded (culprit); L-LAD patent; S-D1 patent; S-RPDA patent >> med Rx Myoview 10/2018: no isch, EF 52, apical thinning  Myoview 7/21: low risk  Myoview 12/22: low risk  Heart failure with preserved ejection fraction  admx in 11/2019 TTE 05/10/21: EF 60-65, mild LVH, GR 1 DD, mildly reduced RVSF, mild LAE, trivial MR, moderate AV calcification, mild AS (mean 11.7, V-max 250 cm/s)  RHC 06/06/21: PCWP 4, CO 4.1, CI 2.1 (low filling pressures, mod reduced CO) CPET 11/20/21: limitations due to deconditioning and body habitus TTE 12/30/2022: EF 60-65, no RWMA, moderate LVH, GR 1 DD, normal RVSF, mild MR, mild AI, mild AS (mean 11 mmHg) TTE 09/12/2023: EF 60-65, no RWMA, mild LVH, normal RVSF, normal PASP, moderate LAE, trivial MR, moderate MAC, mild AI, mild-moderate AS (mean  gradient 9, V-max 205 cm/s, DI 0.42) Paroxysmal atrial fibrillation CHA2DS2-VASc Score = 6 >> Apixaban   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 Allyson Hess 7.2020; bilat tibial disease >> Med Rx (Pletal) >> DC'd with CHF   LE art [place] 01/2019: Right PTA and ATA occluded; left ATA occluded  Carotid artery disease [place] 06/21/20: bilat ICA 1-39     Dementia  Myasthenia gravis            Discussed the use of AI scribe software for clinical note transcription with the patient, who gave verbal consent to proceed.  History of Present Illness Fernando Hess is a 78 y.o. male who returns for follow up of CAD, CHD, AFib, AS. He was last seen by Dr. Shari Hess in 07/2022. He has had multiple visits to the ED and hospital admissions since. His last admission was in Dec 2024. He presented with weakness. There were concerns for myasthenia gravis crisis. But this was ruled  out.    ROS-See HPI***    Studies Reviewed:       *** Results    Risk Assessment/Calculations:   {Does this patient have ATRIAL FIBRILLATION?:[phone]} No BP recorded.  {Refresh Note OR Click here to enter BP  :1}***       Physical Exam:   VS:  There were no vitals taken for this visit.   Wt Readings from Last 3 Encounters:  09/28/23 182 lb (82.6 kg)  09/11/23 165 lb (74.8 kg)  08/07/23 165 lb (74.8 kg)    Physical Exam***     Assessment and Plan:   Assessment & Plan Coronary artery disease involving native coronary artery of native heart without  angina pectoris  Chronic heart failure with preserved ejection fraction ([name])  Paroxysmal atrial fibrillation ([name])  Moderate aortic stenosis  Essential hypertension  Peripheral artery disease  Assessment and Plan Assessment & Plan    { Dyspnea Etiology is not clear.  He has PFTs pending with pulmonology in August.  He did not have improvement in his symptoms with septoplasty.  Myoview in 08/2021 was low risk and neg for ischemia.  He is not having chest pain or his angina equiv (bilat neck pain).  RHC in 9/22 demonstrated low filling pressures and his diuretic dose was reduced.  I have recommended that he undergo cardiopulmonary stress test to better define his dyspnea. Arrange CPET Check BNP, CMET   Coronary artery disease involving native coronary artery of native heart without angina pectoris He has fairly chronic dyspnea on exertion.  He has not had chest pain or his anginal equivalent.  His recent Myoview was low risk.  Proceed with CPET as noted.  He is not on aspirin as he is on Apixaban.  Continue atorvastatin 40 mg daily.  Follow-up 3 months.   Essential hypertension Blood pressure well controlled.  He is not currently on medical therapy.   Hyperlipidemia LDL goal <70 LDL on most recent labs above goal at 84.  Continue atorvastatin 40 mg daily.  If his LDL remains above 70, consider increasing atorvastatin versus adding ezetimibe. CMET, Fasting Lipids today    Paroxysmal atrial fibrillation ([name]) He is maintaining sinus rhythm.  Continue apixaban 5 mg twice daily.  His weight is over 60 kg and he is under 99 years old.  Recent hemoglobin normal.  He has a history of tachybradycardia syndrome.  He is no longer on beta-blocker therapy.  He has not had further syncope.   Chronic heart failure with preserved ejection fraction (HFpEF) ([name]) Volume status appears stable.  He is NYHA IIb-III.  Continue furosemide 20 mg daily.  GFR has been above 30.  Consider starting SGLT2  inhibitor if CPET indicates circulatory limitations related to heart failure.   Mild aortic stenosis I could not appreciate a murmur on exam today.  His last echocardiogram in 8/22 demonstrated a mean gradient of 11.7 mmHg.  Plan follow-up echocardiogram in the next 1 to 2 years.   CKD (chronic kidney disease) stage 4, GFR 15-29 ml/min ([name]) He is followed by Dr. Hyman Hopes with nephrology.     :1}    {Are you ordering a CV Procedure (e.g. stress test, cath, DCCV, TEE, etc)?   Press F2        :[id number]}  Dispo:  No follow-ups on file.  Signed, Tereso Newcomer, [name]

## 2023-12-07 ENCOUNTER — Ambulatory Visit: Attending: Physician Assistant | Admitting: Physician Assistant

## 2023-12-07 DIAGNOSIS — I10 Essential (primary) hypertension: Secondary | ICD-10-CM

## 2023-12-07 DIAGNOSIS — I739 Peripheral vascular disease, unspecified: Secondary | ICD-10-CM

## 2023-12-07 DIAGNOSIS — I5032 Chronic diastolic (congestive) heart failure: Secondary | ICD-10-CM

## 2023-12-07 DIAGNOSIS — I2510 Atherosclerotic heart disease of native coronary artery without angina pectoris: Secondary | ICD-10-CM

## 2023-12-07 DIAGNOSIS — I350 Nonrheumatic aortic (valve) stenosis: Secondary | ICD-10-CM

## 2023-12-07 DIAGNOSIS — I480 Paroxysmal atrial fibrillation: Secondary | ICD-10-CM

## 2023-12-13 NOTE — Progress Notes (Unsigned)
 HPI Fernando Hess never smoker followed for OSA, complicated by CAD/ MI/ RBBB/ CABG, PAD, HTN, dCHF, OSA, DM2/ retinopathy, CKD3, Myasthenia Gravis,  ENT/ [facility]- Septoplasty/ turbinate reduction 08/30/21. NPSG ([facility]) 11/25/18- AHI 37.9/ hr, desaturation to 79%, body weight 201 lbs Walk Test Room Air 06/18/21- 3 Steady laps- lowest O2 sat 95%, max HR 103. PFT 04/21/22- Moderate Obstruction, weak effort. Per Tech-Patient refused DLCP/ Pleth today CPET 11/20/21- normal with exercise limitedd by deconditioning and body habitus ================================================================  06/10/22- [name] never smoker followed for OSA, complicated by CAD/ MI/ RBBB/ CABG/ AFib, PAD, HTN, dCHF,  DM2/ retinopathy, CKD3, Myasthenia Gravis -[name] 5, Melatonin, Cymbalta, Albuterol hfa,  CPAP- auto 5-15 / [employer]        AirSense 10 AutoSet                           Wife here Download compliance- 14%, AHI 2.8/hr Body weight today-  184 lbs                             Covid vax-3 Phizer He got concerned that the Respironics recall and questions about Ozone cleaning devices might somehow also relate to his ResMed machine and might aggravate his Myasthenia Gravis. I told him I know of no such connection. He is going to resume use of his machine. We are out of senior strength flu vax- he chose to get standard flu vax today since here. Pulse IRR on palpation today. Wife confirms he has hx AFib/ Eliquis. CT chest 05/01/22- IMPRESSION: No acute pulmonary abnormality.  09/28/23- [name] never smoker followed for OSA/quit CPAP, complicated by CAD/ MI/ RBBB/ CABG/ AFib, PAD, HTN, dCHF,  DM2/ retinopathy, CKD3, Myasthenia Gravis, Dementia, -[name] 5, Melatonin, Cymbalta, Albuterol hfa,  CPAP- auto 5-15 / [employer]        AirSense 10 AutoSet                           Wife Carney Bern here Download compliance-  Body weight today-  182 lbs Hosp f/u- recent hosp for generalized weakness, possibly due to Myasthenia Gravis, seen by Neurology.   Had abnormal CT for f/u. He says cough never productive and had no fever. Today he is in wheelchair, but says breathing much better, strength better. Says new CPAP was sent, but he never felt he could breathe with it and stopped using it a year ago without calling us. CT chest 09/12/23- done for dyspnea IMPRESSION: 1. Patchy left upper lobe ground-glass airspace opacities. Findings suggest infection/inflammation. Recommend follow-up CT in 3 months to evaluate for complete resolution exclude underlying malignancy such as adenocarcinoma. 2. Trace loculated right pleural effusion. 3. Cardiomegaly. 4. Upper limits of normal aortopulmonary lymph node measuring up to 1 cm. Recommend attention on follow-up. No gross hilar adenopathy, noting limited sensitivity for the detection of hilar adenopathy on this noncontrast study. 5. 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 Recommend annual imaging followup by CTA or MRA. This recommendation follows 2010 ACCF/AHA/AATS/ACR/ASA/SCA/SCAI/SIR/STS/SVM Guidelines for the Diagnosis and Management of Patients with Thoracic Aortic Disease. Circulation. 2010; 121: Z610-R604. Aortic aneurysm NOS (ICD10-I71.9).  Discussed the use of AI scribe software for clinical note transcription with the patient, who gave verbal consent to proceed.  History of Present Illness   The patient, with a history of wheelchair dependence, was recently hospitalized due to severe weakness and difficulty breathing.  The spouse reports that the patient was unable to lift himself out of the wheelchair, a task he is usually able to perform. The patient also experienced shallow, panting breaths for approximately forty hours, preventing sleep. The patient's breathing has since improved, and he is now able to stand up.  During the hospitalization, a CT scan revealed patchy left upper lobe ground glass shadowing in the lung, which the radiologist suggested could be due to  infection, inflammation, or possibly a hidden cancer. The patient denies coughing up anything during this illness and denies fever.  The patient also has a history of using CPAP for sleep apnea, but has not used it for over a year due to issues with adjusting a new machine. The patient uses a fan when sleeping, which he reports helps him.   Assessment and Plan:    Respiratory Distress Recent hospitalization for severe shortness of breath and weakness. CT scan showed patchy left upper lobe ground glass opacities, possibly pneumonia. Currently improved with no cough, fever, or sputum production. Lung sounds clear on examination. -Order chest x-ray today to assess for resolution of pneumonia. -Repeat CT scan in 3 months as recommended by radiologist to rule out hidden malignancy.  Sleep Apnea Not using CPAP for over a year due to issues with new machine. -Order overnight oximetry to assess for nocturnal hypoxemia. -Consider new sleep study if oximetry suggests low oxygen levels.  12/13/2023 03:53 PM Follow-up appointment scheduled for October 27, 2023.   12/15/23-  77yoM never smoker followed for OSA/quit CPAP, complicated by CAD/ MI/ RBBB/ CABG/ AFib, PAD, HTN, dCHF,  DM2/ retinopathy, CKD3, Myasthenia Gravis, Dementia, -[name] 5, Melatonin, Cymbalta, Albuterol hfa, Pulmicort neb solution,  CPAP- auto 5-15 / [employer]        AirSense 10 AutoSet                           Wife Carney Bern here Download compliance-  Body weight today-   We ordered ONOX last visit- not done?        He is due for f/u CT chest for LUL ground glass   ROS-see HPI   + = positive Constitutional:    weight loss, night sweats, fevers, chills, fatigue, lassitude. HEENT:    headaches, difficulty swallowing, tooth/dental problems, sore throat,       sneezing, itching, ear ache, nasal congestion, post nasal drip, snoring CV:    chest pain, orthopnea, PND, swelling in lower extremities, anasarca,                                 dizziness, palpitations Resp:   +shortness of breath with exertion or at rest.                productive cough,   non-productive cough, coughing up of blood.              change in color of mucus.  wheezing.   Skin:    rash or lesions. GI:  No-   heartburn, indigestion, abdominal pain, nausea, vomiting, diarrhea,                 change in bowel habits, loss of appetite GU: dysuria, change in color of urine, no urgency or frequency.   flank pain. MS:   joint pain, stiffness, decreased range of motion, back pain. Neuro-     nothing unusual Psych:  change  in mood or affect.  depression or anxiety.   memory loss.   OBJ- Physical Exam     Unlabored General- Alert, Oriented, Affect-appropriate, Distress- none acute, +obese Skin- rash-none, lesions- none, excoriation- none Lymphadenopathy- none Head- atraumatic            Eyes- Gross vision intact, PERRLA, conjunctivae and secretions clear            Ears- Hearing, canals-normal            Nose- Clear, no-Septal dev, mucus, polyps, erosion, perforation             Throat- Mallampati III , mucosa clear , drainage- none, tonsils- atrophic,  +teeth/ some repair,  Neck- flexible , trachea midline, no stridor , thyroid nl, carotid no bruit Chest - symmetrical excursion , unlabored           Heart/CV- IRR/ AFib , no murmur , no gallop  , no rub, nl s1 s2                           - JVD- none , edema- none, stasis changes- none, varices- none           Lung- clear to P&A, wheeze- none, cough- none , dullness-none, rub- none           Chest wall-  Abd-  Br/ Gen/ Rectal- Not done, not indicated Extrem- cyanosis- none, clubbing, none, atrophy- none, strength- nl Neuro- grossly intact to observation

## 2023-12-15 ENCOUNTER — Telehealth: Payer: Self-pay

## 2023-12-15 ENCOUNTER — Encounter: Payer: Self-pay | Admitting: Internal Medicine

## 2023-12-15 ENCOUNTER — Ambulatory Visit (INDEPENDENT_AMBULATORY_CARE_PROVIDER_SITE_OTHER): Payer: Medicare Other | Admitting: Internal Medicine

## 2023-12-15 VITALS — BP 118/62 | HR 88 | Temp 98.2°F | Ht 66.0 in | Wt 188.0 lb

## 2023-12-15 DIAGNOSIS — R053 Chronic cough: Secondary | ICD-10-CM

## 2023-12-15 DIAGNOSIS — E1122 Type 2 diabetes mellitus with diabetic chronic kidney disease: Secondary | ICD-10-CM

## 2023-12-15 MED ORDER — PROMETHAZINE-DM 6.25-15 MG/5ML PO SYRP: 5.0000 mL | ORAL_SOLUTION | Freq: Four times a day (QID) | ORAL | 2 refills | Status: DC | PRN

## 2023-12-15 NOTE — Patient Instructions (Addendum)
 Script sent for prometh DM cough syrup to use if needed  We will track down result of overnight oximetry test from January.   Keep walking as much as you can to keep your stamina up.

## 2023-12-15 NOTE — Patient Outreach (Signed)
 Submitted faxed referral for assistance.  [facility]  Population Health Care Management Assistant  Direct Dial: [phone]  Fax: [phone] Website: [email or web address]

## 2023-12-16 ENCOUNTER — Encounter: Payer: Self-pay | Admitting: Internal Medicine

## 2023-12-17 ENCOUNTER — Ambulatory Visit: Attending: Family Medicine

## 2023-12-17 DIAGNOSIS — R2689 Other abnormalities of gait and mobility: Secondary | ICD-10-CM | POA: Diagnosis present

## 2023-12-17 DIAGNOSIS — M6281 Muscle weakness (generalized): Secondary | ICD-10-CM | POA: Insufficient documentation

## 2023-12-17 DIAGNOSIS — R2681 Unsteadiness on feet: Secondary | ICD-10-CM | POA: Insufficient documentation

## 2023-12-17 DIAGNOSIS — R262 Difficulty in walking, not elsewhere classified: Secondary | ICD-10-CM | POA: Insufficient documentation

## 2023-12-17 NOTE — Therapy (Unsigned)
 OUTPATIENT PHYSICAL THERAPY NEURO EVALUATION   Patient Name: Fernando Hess MRN: [id number] DOB:July 31, 1946, 78 y.o., male Today's Date: 12/17/2023   PCP: [name], MD REFERRING PROVIDER: [name], MD  END OF SESSION:  PT End of Session - 12/17/23 1419     Visit Number 1    Number of Visits 13    Date for PT Re-Evaluation 02/11/24    Authorization Type [employer]    Progress Note Due on Visit 10    PT Start Time 1420    PT Stop Time 1520    PT Time Calculation (min) 60 min             Past Medical History:  Diagnosis Date   Acute renal failure (ARF) ([name]) 09/24/2015   Acute stress disorder 11/13/2021   Atrial premature complexes    CAD (coronary artery disease) of artery bypass graft    Early occlusion of saphenous vein graft to intermediate and marginal branch in February 2007 following bypass grafting    CAD (coronary artery disease), native coronary artery 2017   hx NSTEMI 09-24-2015  s/p  CABG x5 on 10-02-2015;  post op STEMI inferolateral wall,  SVG OM1 and SVG OM2 occluded, distal OM occlusion the calpruit, treated medically // Myoview 7/21: no ischemia, EF 65, low risk   CHF (congestive heart failure) ([name])    CKD (chronic kidney disease), stage III ([name])    Contusion of right knee 03/16/2020   Dyspnea    Elevated troponin    Erectile dysfunction    Esophageal reflux    History of atrial fibrillation    post op CABG 10-02-2015   History of kidney stones    History of non-ST elevation myocardial infarction (NSTEMI) 09/24/2015   s/p  CABG x5   History of ST elevation myocardial infarction (STEMI) 10/22/2015   inferior wall,  post op CABG 10-02-2015   Hyperlipidemia    Hypertension    Left ureteral stone    Mild atherosclerosis of both carotid arteries    Nephrolithiasis    per CT bilateral non-obstructive calculi   OSA (obstructive sleep apnea)    Peripheral artery disease    LE Arterial US 01/2019: R PTA and ATA  occluded; L ATA occluded   RBBB (right bundle branch block)    Renal atrophy, right    Sleep apnea    wears cpap    ST elevation myocardial infarction (STEMI) of inferior wall ([name]) 10/22/2015   Type 2 diabetes mellitus treated with insulin ([name])    followed by pcp   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) 03/01/2008   Wears glasses    Past Surgical History:  Procedure Laterality Date   APPENDECTOMY  1965   CARDIAC CATHETERIZATION N/A 09/26/2015   Procedure: Left Heart Cath and Coronary Angiography;  Surgeon: Lennette Bihari, MD;  Location: [facility] INVASIVE CV LAB;  Service: Cardiovascular;  Laterality: N/A;   CARDIAC CATHETERIZATION N/A 10/22/2015   Procedure: Left Heart Cath and Coronary Angiography;  Surgeon: Tonny Bollman, MD;  Location[facility] INVASIVE CV LAB;  Service: Cardiovascular;  Laterality: N/A;   CATARACT EXTRACTION W/ INTRAOCULAR LENS  IMPLANT, BILATERAL  2017   COLONOSCOPY     CORONARY ARTERY BYPASS GRAFT N/A 10/02/2015   Procedure: CORONARY ARTERY BYPASS GRAFTING (CABG) X5 LIMA-LAD; SVG-DIAG; SVG-OM; SVG-PD; SVG-RAMUS TRANSESOPHAGEAL ECHOCARDIOGRAM (TEE) ENDOSCOPIC GREATER SAPHENOUS VEIN  HARVEST BILAT LE;  Surgeon: Kerin Perna, MD;  Location: [facility] OR;  Service: Open Heart Surgery;  Laterality: N/A;   CYSTOSCOPY/URETEROSCOPY/HOLMIUM LASER/STENT PLACEMENT Left 08/10/2018   Procedure: CYSTOSCOPY/URETEROSCOPY/HOLMIUM LASER/STENT PLACEMENT;  Surgeon: Jerilee Field, MD;  Location: [facility];  Service: Urology;  Laterality: Left;   CYSTOSCOPY/URETEROSCOPY/HOLMIUM LASER/STENT PLACEMENT Left 09/10/2018   Procedure: CYSTOSCOPY/URETEROSCOPY/HOLMIUM LASER/STENT EXCHANGE;  Surgeon: Jerilee Field, MD;  Location: [facility];  Service: Urology;  Laterality: Left;   HIP ARTHROPLASTY Right 12/26/2022   Procedure: RIGHT HEMI HIP ARTHROPLASTY;  Surgeon: Joen Laura, MD;  Location: [facility] OR;  Service:  Orthopedics;  Laterality: Right;   LEFT HEART CATHETERIZATION WITH CORONARY ANGIOGRAM N/A 04/13/2014   Procedure: LEFT HEART CATHETERIZATION WITH CORONARY ANGIOGRAM;  Surgeon: Othella Boyer, MD;  Location: [facility] CATH LAB;  Service: Cardiovascular;  Laterality: N/A;   LEG SURGERY Right age 72   closed reduction leg fracture   NASAL SEPTOPLASTY W/ TURBINOPLASTY Bilateral 08/30/2021   Procedure: NASAL SEPTOPLASTY WITH BILATERAL INFERIOR TURBINATE REDUCTION;  Surgeon: Osborn Coho, MD;  Location: [facility] OR;  Service: ENT;  Laterality: Bilateral;   POLYPECTOMY     RIGHT HEART CATH N/A 06/06/2021   Procedure: RIGHT HEART CATH;  Surgeon: Dolores Patty, MD;  Location: [facility] INVASIVE CV LAB;  Service: Cardiovascular;  Laterality: N/A;   TEE WITHOUT CARDIOVERSION N/A 10/02/2015   Procedure: TRANSESOPHAGEAL ECHOCARDIOGRAM (TEE);  Surgeon: Kerin Perna, MD;  Location[facility] OR;  Service: Open Heart Surgery;  Laterality: N/A;   URETEROSCOPY WITH HOLMIUM LASER LITHOTRIPSY Bilateral 2004;  2005  dr grapey  @[facility]    VASECTOMY     Patient Active Problem List   Diagnosis Date Noted   SOB (shortness of breath) 09/15/2023   BPH (benign prostatic hyperplasia) 02/27/2023   History of anemia due to chronic kidney disease 02/27/2023   COVID-19 02/27/2023   COVID-19 virus infection 02/26/2023   Acute kidney injury superimposed on chronic kidney disease ([name]) 02/03/2023   Transient hypotension 02/03/2023   SIRS (systemic inflammatory response syndrome) ([name]) 02/03/2023   Dementia without behavioral disturbance ([name]) 02/03/2023   Fall at home, initial encounter 02/03/2023   Acute respiratory failure with hypoxia ([name]) 02/03/2023   Hypokalemia 02/03/2023   Prolonged QT interval 02/03/2023   Left ankle pain 01/08/2023   Hematuria 01/08/2023   Fracture of femoral neck, right, closed ([name]) 12/24/2022   Depression with anxiety 12/24/2022   Acute metabolic encephalopathy 09/18/2022   Acute encephalopathy 09/17/2022    AKI (acute kidney injury) ([name]) 09/04/2022   Generalized weakness 08/12/2022   Myasthenia gravis ([name]) 04/21/2022   CKD stage 3b, GFR 30-44 ml/min ([name]) 03/21/2022   Posterior vitreous detachment of both eyes 01/23/2022   Constipation 11/13/2021   Controlled type 2 diabetes mellitus without complication, without long-term current use of insulin ([name]) 11/13/2021   Severe major depression, single episode, without psychotic features ([name]) 11/13/2021   Amaurosis fugax 11/13/2021   Amnesia 11/13/2021   Anxiety 11/13/2021   Cardiac arrhythmia 11/13/2021   Hearing loss 11/13/2021   Hypercoagulable state ([name]) 11/13/2021   Hyperparathyroidism due to renal insufficiency ([name]) 11/13/2021   Moderate aortic stenosis 11/05/2021   Paroxysmal atrial fibrillation ([name]) 11/05/2021   Nasal septal deviation 08/30/2021   Anticoagulated by anticoagulation treatment 07/16/2021   Syncope 05/10/2021   Gastro-esophageal reflux disease without esophagitis 07/25/2020   Globus pharyngeus 07/25/2020   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) 12/21/2019   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00 (ICD-10-CM) - Myasthenia gravis without (acute) exacerbation R29.898 (ICD-10-CM) - Other symptoms and signs involving the musculoskeletal system  THERAPY DIAG:  Muscle weakness  (generalized)  Unsteadiness on feet  Difficulty in walking, not elsewhere classified  Other abnormalities of gait and mobility  Rationale for Evaluation and Treatment: Rehabilitation  SUBJECTIVE:                                                                                                                                                                                             SUBJECTIVE STATEMENT: Had a flare-up of MG and experienced fall in December and was left weak and was using w/c but has progressed to returning to rollator. Has been working with home health therapy up until last week. Notes dizziness/lightheadedness with sit to stand which resolves after 1-2 minutes.  No recent falls, but reports limited endurance and mainly sedentary other than 1/4 mile walk daily using rollator with seated rest breaks ad lib.[name] Notes ongoing balance deficits and limited endurance from fatigue and DOE. Pt accompanied by: self  PERTINENT HISTORY: DM, HTN, HLD, sleep apnea, PAD, CAD s/p quintuple bypass, CKD, CHF, myasthenia gravis, Afib, dementia, Right hip hemiarthroplasty.  PAIN:  Are you having pain? No  PRECAUTIONS: Fall  RED FLAGS: None   WEIGHT BEARING RESTRICTIONS: No  FALLS: Has patient fallen in last 6 months? Yes. Number of falls 1  LIVING ENVIRONMENT: Lives with: lives with their spouse Lives in: House/apartment Stairs: Yes: Internal: has lift to 2nd floor steps; lift and External: 5 steps; lift to access Has following equipment at home: Walker - 4 wheeled, leaves one outside and one inside  PLOF: Independent with basic ADLs, Independent with household mobility with device, and uses seat for shower  PATIENT GOALS: improve balance, dizziness (when standing), SOB/DOE  OBJECTIVE:  Note: Objective measures were completed at Evaluation unless otherwise noted.  Vital signs: 97%, 80 bpm, 113/78 mmHg (seated); 97%, (standing) 112/75 mmHg, 94 bpm, 97%  DIAGNOSTIC FINDINGS:    COGNITION: Overall cognitive status: Within functional limits for tasks assessed   SENSATION: Reports numbness along right lower leg (  knee down)  COORDINATION: WFL, difficulty with heel to shin from proximal hip weakness  EDEMA:  none  MUSCLE TONE:  Appreciate some hypotonia throughout BLE, tib anterior most evident   DTRs:  NT  POSTURE: forward head  LOWER EXTREMITY ROM:     Active  Right Eval Left Eval  Hip flexion 95 100  Hip extension    Hip abduction    Hip adduction    Hip internal rotation    Hip external rotation    Knee flexion 120 120  Knee extension 0 0  Ankle dorsiflexion 10 10  Ankle plantarflexion    Ankle inversion    Ankle eversion     (Blank rows = not tested)  LOWER EXTREMITY MMT:  (seated resisted tests)  MMT Right Eval Left Eval  Hip flexion 3 3+  Hip extension    Hip abduction 2+/3- 3+  Hip adduction 3+ 3+  Hip internal rotation    Hip external rotation    Knee flexion 4 4  Knee extension 4- 4-  Ankle dorsiflexion 3 3  Ankle plantarflexion 2+ 2+  Ankle inversion    Ankle eversion    (Blank rows = not tested)  BED MOBILITY:  Reports independence  TRANSFERS: Assistive device utilized: Environmental consultant - 4 wheeled  Sit to stand: {Levels of assistance:24026} Stand to sit: {Levels of assistance:24026} Chair to chair: {Levels of assistance:24026} Floor: {Levels of assistance:24026}  RAMP:  Level of Assistance: {Levels of assistance:24026} Assistive device utilized: {Assistive devices:23999} Ramp Comments: ***  CURB:  Level of Assistance: {Levels of assistance:24026} Assistive device utilized: {Assistive devices:23999} Curb Comments: ***  STAIRS: Level of Assistance: {Levels of assistance:24026} Stair Negotiation Technique: {Stair Technique:27161} with {Rail Assistance:27162} Number of Stairs: ***  Height of Stairs: ***  Comments: ***  GAIT: Gait pattern: {gait characteristics:25376} Distance walked: *** Assistive device  utilized: {Assistive devices:23999} Level of assistance: {Levels of assistance:24026} Comments: ***  FUNCTIONAL TESTS:  5 times sit to stand: 18.97 sec Berg Balance Scale: 40/56 : TBD  M-CTSIB  Condition 1: Firm Surface, EO 30 Sec, Mild Sway  Condition 2: Firm Surface, EC 30 Sec, Mild and Moderate Sway  Condition 3: Foam Surface, EO 30 Sec, Mild Sway  Condition 4: Foam Surface, EC 30 Sec, Moderate and Severe Sway     PATIENT SURVEYS:  {rehab surveys:24030}                                                                                                                              TREATMENT DATE: ***    PATIENT EDUCATION: Education details: *** Person educated: {Person educated:25204} Education method: {Education Method:25205} Education comprehension: {Education Comprehension:25206}  HOME EXERCISE PROGRAM: Access Code: [email or web address] URL: [email or web address] Date: 12/17/2023 Prepared by: Shary Decamp  Exercises - Sit to Stand with Resistance Around Legs  - 2-3 x daily - 7 x weekly - 5 sets - 5 reps - Seated Ankle Dorsiflexion with Resistance  - 1 x daily -  7 x weekly - 3 sets - 10 reps - 3 sec hold  GOALS: Goals reviewed with patient? {yes/no:20286}  SHORT TERM GOALS: Target date: ***  *** Baseline: Goal status: INITIAL  2.  *** Baseline:  Goal status: INITIAL  3.  *** Baseline:  Goal status: INITIAL  4.  *** Baseline:  Goal status: INITIAL  5.  *** Baseline:  Goal status: INITIAL  6.  *** Baseline:  Goal status: INITIAL  LONG TERM GOALS: Target date: ***  *** Baseline:  Goal status: INITIAL  2.  *** Baseline:  Goal status: INITIAL  3.  *** Baseline:  Goal status: INITIAL  4.  *** Baseline:  Goal status: INITIAL  5.  *** Baseline:  Goal status: INITIAL  6.  *** Baseline:  Goal status: INITIAL  ASSESSMENT:  CLINICAL IMPRESSION: Patient is a *** y.o. *** who was seen today for physical therapy evaluation and  treatment for ***.   OBJECTIVE IMPAIRMENTS: {opptimpairments:25111}.   ACTIVITY LIMITATIONS: {activitylimitations:27494}  PARTICIPATION LIMITATIONS: {participationrestrictions:25113}  PERSONAL FACTORS: {Personal factors:25162} are also affecting patient's functional outcome.   REHAB POTENTIAL: {rehabpotential:25112}  CLINICAL DECISION MAKING: {clinical decision making:25114}  EVALUATION COMPLEXITY: {Evaluation complexity:25115}  PLAN:  PT FREQUENCY: {rehab frequency:25116}  PT DURATION: {rehab duration:25117}  PLANNED INTERVENTIONS: {rehab planned interventions:25118::"97110-Therapeutic exercises","97530- Therapeutic [phone]- Neuromuscular re-education","97535- Self YTKZ","60109- Manual therapy"}  PLAN FOR NEXT SESSION: ***   Dion Body, PT 12/17/2023, 3:26 PM

## 2023-12-18 ENCOUNTER — Other Ambulatory Visit: Payer: Self-pay

## 2023-12-21 ENCOUNTER — Telehealth: Payer: Self-pay

## 2023-12-21 DIAGNOSIS — E1122 Type 2 diabetes mellitus with diabetic chronic kidney disease: Secondary | ICD-10-CM

## 2023-12-21 NOTE — Patient Outreach (Signed)
 Re-Submitting faxed referral for assistance.   [facility]  Population Health Care Management Assistant  Direct Dial: [phone]  Fax: [phone] Website: [email or web address]

## 2023-12-22 ENCOUNTER — Ambulatory Visit: Admitting: Rehabilitation

## 2023-12-22 ENCOUNTER — Encounter: Payer: Self-pay | Admitting: Rehabilitation

## 2023-12-22 DIAGNOSIS — M6281 Muscle weakness (generalized): Secondary | ICD-10-CM | POA: Diagnosis not present

## 2023-12-22 DIAGNOSIS — R262 Difficulty in walking, not elsewhere classified: Secondary | ICD-10-CM

## 2023-12-22 DIAGNOSIS — R2689 Other abnormalities of gait and mobility: Secondary | ICD-10-CM

## 2023-12-22 DIAGNOSIS — R2681 Unsteadiness on feet: Secondary | ICD-10-CM

## 2023-12-22 NOTE — Therapy (Unsigned)
 OUTPATIENT PHYSICAL THERAPY NEURO TREATMENT   Patient Name: Fernando Hess MRN: [id number] DOB:December 15, 1945, 78 y.o., male Today's Date: 12/23/2023   PCP: [name], MD REFERRING PROVIDER: [name], MD  END OF SESSION:  PT End of Session - 12/22/23 0758     Visit Number 2    Number of Visits 13    Date for PT Re-Evaluation 02/11/24    Authorization Type [employer]    Progress Note Due on Visit 10    PT Start Time 0845    PT Stop Time 0930    PT Time Calculation (min) 45 min    Equipment Utilized During Treatment Other (comment)   floatation devices as needed for safety   Activity Tolerance Patient tolerated treatment well    Behavior During Therapy [facility] for tasks assessed/performed             Past Medical History:  Diagnosis Date   Acute renal failure (ARF) ([name]) 09/24/2015   Acute stress disorder 11/13/2021   Atrial premature complexes    CAD (coronary artery disease) of artery bypass graft    Early occlusion of saphenous vein graft to intermediate and marginal branch in February 2007 following bypass grafting    CAD (coronary artery disease), native coronary artery 2017   hx NSTEMI 09-24-2015  s/p  CABG x5 on 10-02-2015;  post op STEMI inferolateral wall,  SVG OM1 and SVG OM2 occluded, distal OM occlusion the calpruit, treated medically // Myoview 7/21: no ischemia, EF 65, low risk   CHF (congestive heart failure) ([name])    CKD (chronic kidney disease), stage III ([name])    Contusion of right knee 03/16/2020   Dyspnea    Elevated troponin    Erectile dysfunction    Esophageal reflux    History of atrial fibrillation    post op CABG 10-02-2015   History of kidney stones    History of non-ST elevation myocardial infarction (NSTEMI) 09/24/2015   s/p  CABG x5   History of ST elevation myocardial infarction (STEMI) 10/22/2015   inferior wall,  post op CABG 10-02-2015   Hyperlipidemia    Hypertension    Left ureteral stone    Mild  atherosclerosis of both carotid arteries    Nephrolithiasis    per CT bilateral non-obstructive calculi   OSA (obstructive sleep apnea)    Peripheral artery disease    LE Arterial US 01/2019: R PTA and ATA occluded; L ATA occluded   RBBB (right bundle branch block)    Renal atrophy, right    Sleep apnea    wears cpap    ST elevation myocardial infarction (STEMI) of inferior wall ([name]) 10/22/2015   Type 2 diabetes mellitus treated with insulin ([name])    followed by pcp   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) 03/01/2008   Wears glasses    Past Surgical History:  Procedure Laterality Date   APPENDECTOMY  1965   CARDIAC CATHETERIZATION N/A 09/26/2015   Procedure: Left Heart Cath and Coronary Angiography;  Surgeon: Lennette Bihari, MD;  Location: [facility] INVASIVE CV LAB;  Service: Cardiovascular;  Laterality: N/A;   CARDIAC CATHETERIZATION N/A 10/22/2015   Procedure: Left Heart Cath and Coronary Angiography;  Surgeon: Tonny Bollman, MD;  Location: [facility] INVASIVE CV LAB;  Service: Cardiovascular;  Laterality: N/A;   CATARACT EXTRACTION W/ INTRAOCULAR LENS  IMPLANT, BILATERAL  2017   COLONOSCOPY     CORONARY ARTERY  BYPASS GRAFT N/A 10/02/2015   Procedure: CORONARY ARTERY BYPASS GRAFTING (CABG) X5 LIMA-LAD; SVG-DIAG; SVG-OM; SVG-PD; SVG-RAMUS TRANSESOPHAGEAL ECHOCARDIOGRAM (TEE) ENDOSCOPIC GREATER SAPHENOUS VEIN  HARVEST BILAT LE;  Surgeon: Kerin Perna, MD;  Location: [facility] OR;  Service: Open Heart Surgery;  Laterality: N/A;   CYSTOSCOPY/URETEROSCOPY/HOLMIUM LASER/STENT PLACEMENT Left 08/10/2018   Procedure: CYSTOSCOPY/URETEROSCOPY/HOLMIUM LASER/STENT PLACEMENT;  Surgeon: Jerilee Field, MD;  Location: [facility];  Service: Urology;  Laterality: Left;   CYSTOSCOPY/URETEROSCOPY/HOLMIUM LASER/STENT PLACEMENT Left 09/10/2018   Procedure: CYSTOSCOPY/URETEROSCOPY/HOLMIUM LASER/STENT EXCHANGE;  Surgeon: Jerilee Field, MD;  Location: [facility] ORS;  Service: Urology;  Laterality: Left;   HIP ARTHROPLASTY Right 12/26/2022   Procedure: RIGHT HEMI HIP ARTHROPLASTY;  Surgeon: Joen Laura, MD;  Location: [facility] OR;  Service: Orthopedics;  Laterality: Right;   LEFT HEART CATHETERIZATION WITH CORONARY ANGIOGRAM N/A 04/13/2014   Procedure: LEFT HEART CATHETERIZATION WITH CORONARY ANGIOGRAM;  Surgeon: Othella Boyer, MD;  Location: [facility] CATH LAB;  Service: Cardiovascular;  Laterality: N/A;   LEG SURGERY Right age 71   closed reduction leg fracture   NASAL SEPTOPLASTY W/ TURBINOPLASTY Bilateral 08/30/2021   Procedure: NASAL SEPTOPLASTY WITH BILATERAL INFERIOR TURBINATE REDUCTION;  Surgeon: Osborn Coho, MD;  Location: [facility] OR;  Service: ENT;  Laterality: Bilateral;   POLYPECTOMY     RIGHT HEART CATH N/A 06/06/2021   Procedure: RIGHT HEART CATH;  Surgeon: Dolores Patty, MD;  Location: [facility] INVASIVE CV LAB;  Service: Cardiovascular;  Laterality: N/A;   TEE WITHOUT CARDIOVERSION N/A 10/02/2015   Procedure: TRANSESOPHAGEAL ECHOCARDIOGRAM (TEE);  Surgeon: Kerin Perna, MD;  Location: [facility] OR;  Service: Open Heart Surgery;  Laterality: N/A;   URETEROSCOPY WITH HOLMIUM LASER LITHOTRIPSY Bilateral 2004;  2005  dr grapey  @[facility]    VASECTOMY     Patient Active Problem List   Diagnosis Date Noted   SOB (shortness of breath) 09/15/2023   BPH (benign prostatic hyperplasia) 02/27/2023   History of anemia due to chronic kidney disease 02/27/2023   COVID-19 02/27/2023   COVID-19 virus infection 02/26/2023   Acute kidney injury superimposed on chronic kidney disease ([name]) 02/03/2023   Transient hypotension 02/03/2023   SIRS (systemic inflammatory response syndrome) ([name]) 02/03/2023   Dementia without behavioral disturbance ([name]) 02/03/2023   Fall at home, initial encounter 02/03/2023   Acute respiratory failure with hypoxia ([name]) 02/03/2023   Hypokalemia 02/03/2023   Prolonged QT interval 02/03/2023   Left ankle pain  01/08/2023   Hematuria 01/08/2023   Fracture of femoral neck, right, closed ([name]) 12/24/2022   Depression with anxiety 12/24/2022   Acute metabolic encephalopathy 09/18/2022   Acute encephalopathy 09/17/2022   AKI (acute kidney injury) ([name]) 09/04/2022   Generalized weakness 08/12/2022   Myasthenia gravis ([name]) 04/21/2022   CKD stage 3b, GFR 30-44 ml/min ([name]) 03/21/2022   Posterior vitreous detachment of both eyes 01/23/2022   Constipation 11/13/2021   Controlled type 2 diabetes mellitus without complication, without long-term current use of insulin ([name]) 11/13/2021   Severe major depression, single episode, without psychotic features ([name]) 11/13/2021   Amaurosis fugax 11/13/2021   Amnesia 11/13/2021   Anxiety 11/13/2021   Cardiac arrhythmia 11/13/2021   Hearing loss 11/13/2021   Hypercoagulable state ([name]) 11/13/2021   Hyperparathyroidism due to renal insufficiency ([name]) 11/13/2021   Moderate aortic stenosis 11/05/2021   Paroxysmal atrial fibrillation ([name]) 11/05/2021   Nasal septal deviation 08/30/2021   Anticoagulated by anticoagulation treatment 07/16/2021   Syncope 05/10/2021   Gastro-esophageal reflux disease without esophagitis 07/25/2020   Globus  pharyngeus 07/25/2020   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) 12/21/2019   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   (HFpEF) heart failure with preserved ejection fraction ([name]) 11/18/2019   OSA on CPAP 03/07/2019   Peripheral artery disease    Intractable vascular headache 11/11/2018   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   Coronary artery disease involving native coronary artery of native heart without angina pectoris 09/24/2015   Moderate nonproliferative diabetic retinopathy of left eye with macular edema associated with type 2 diabetes mellitus ([name]) 03/01/2008   Hyperlipidemia    Essential hypertension    History of kidney stones     ONSET DATE: December 2024 (hospitalized from  falling)  REFERRING DIAG: G70.00 (ICD-10-CM) - Myasthenia gravis without (acute) exacerbation R29.898 (ICD-10-CM) - Other symptoms and signs involving the musculoskeletal system  THERAPY DIAG:  Muscle weakness (generalized)  Unsteadiness on feet  Difficulty in walking, not elsewhere classified  Other abnormalities of gait and mobility  Rationale for Evaluation and Treatment: Rehabilitation  SUBJECTIVE:                                                                                                                                                                                             SUBJECTIVE STATEMENT:  Pt accompanied by: self  PERTINENT HISTORY: DM, HTN, HLD, sleep apnea, PAD, CAD s/p quintuple bypass, CKD, CHF, myasthenia gravis, Afib, dementia, Right hip hemiarthroplasty.  PAIN:  Are you having pain? No  PRECAUTIONS: Fall  RED FLAGS: None   WEIGHT BEARING RESTRICTIONS: No  FALLS: Has patient fallen in last 6 months? Yes. Number of falls 1  LIVING ENVIRONMENT: Lives with: lives with their spouse Lives in: House/apartment Stairs: Yes: Internal: has lift to 2nd floor steps; lift and External: 5 steps; lift to access Has following equipment at home: Walker - 4 wheeled, leaves one outside and one inside  PLOF: Independent with basic ADLs, Independent with household mobility with device, and uses seat for shower  PATIENT GOALS: improve balance, dizziness (when standing), SOB/DOE  OBJECTIVE:  Note: Objective measures were completed at Evaluation unless otherwise noted.  Vital signs: 97%, 80 bpm, 113/78 mmHg (seated); 97%, (standing) 112/75 mmHg, 94 bpm, 97% (From evaluation)  DIAGNOSTIC FINDINGS:   COGNITION: Overall cognitive status: Within functional limits for tasks assessed   SENSATION: Reports numbness along right lower leg (knee down)  COORDINATION: WFL, difficulty with heel to shin from proximal hip weakness  EDEMA:  none  MUSCLE TONE:   Appreciate some hypotonia throughout BLE, tib anterior most evident   DTRs:  NT  POSTURE: forward head  LOWER EXTREMITY  ROM:     Active  Right Eval Left Eval  Hip flexion 95 100  Hip extension    Hip abduction    Hip adduction    Hip internal rotation    Hip external rotation    Knee flexion 120 120  Knee extension 0 0  Ankle dorsiflexion 10 10  Ankle plantarflexion    Ankle inversion    Ankle eversion     (Blank rows = not tested)  LOWER EXTREMITY MMT:  (seated resisted tests)  MMT Right Eval Left Eval  Hip flexion 3 3+  Hip extension    Hip abduction 2+/3- 3+  Hip adduction 3+ 3+  Hip internal rotation    Hip external rotation    Knee flexion 4 4  Knee extension 4- 4-  Ankle dorsiflexion 3 3  Ankle plantarflexion 2+ 2+  Ankle inversion    Ankle eversion    (Blank rows = not tested)  BED MOBILITY:  Reports independence  TRANSFERS: Assistive device utilized: Environmental consultant - 4 wheeled  Sit to stand: Complete Independence and Modified independence Stand to sit: Complete Independence and Modified independence Chair to chair: Complete Independence and Modified independence Floor:  NT    CURB:  Level of Assistance: CGA and Min A Assistive device utilized:  HR Curb Comments:   STAIRS: NT--reports they have stair lifts for external/internal stairs at home  GAIT: Gait pattern:  w/ 1BJ achieves and step through pattern--without AD ambulates with very short, shuffled steps and right retro-Trendelenberg Distance walked:  Assistive device utilized: Environmental consultant - 4 wheeled Level of assistance: Modified independence Comments: deficits in turning  FUNCTIONAL TESTS:  5 times sit to stand: 18.97 sec Berg Balance Scale: 40/56 : TBD  M-CTSIB  Condition 1: Firm Surface, EO 30 Sec, Mild Sway  Condition 2: Firm Surface, EC 30 Sec, Mild and Moderate Sway  Condition 3: Foam Surface, EO 30 Sec, Mild Sway  Condition 4: Foam Surface, EC 30 Sec, Moderate and Severe  Sway                                                                                                                                  TREATMENT DATE: 12/22/23  Patient seen for aquatic therapy today.  Treatment took place in water 3.6-4.0 feet deep depending upon activity.  Pt entered and exited the pool via stairs using B rails as needed for support.  Pool temp approx 92 deg.   Warm up: Walking forwards x approx 18' x 4 reps, side stepping x 4 reps and backwards walking x 4 reps with large white barbell for support and min A (CGA) from PT.  Cues for upright posture and increasing step length bilaterally.  Min unsteadiness, no overt LOB.    Marching forward holding barbell for support as above x 2 laps with cues for keeping trunk upright (no extension) and moving slowly to work on modified SLS.  Standing by wall  for single UE support (light support from PT on opposite side) performed hip flex/ext (with knee ext) x 10 reps on each side.  LE hip circles CW x 10 reps and CCW x 10 reps again with wall for light support.  Squats holding wall for support (cues for light support) with target of PTs leg when squatting for correct technique (x 10 reps).  Wall bumps without UE support (reaching forward) x 10 reps.  Sit<>stand from pool bench with small step under feet without UE support (reaching forward to encourage forward weight shift) x 10 reps.    Standing with barbell for support (PT assisting somewhat also) performed ant/lateral/post stepping x 4 rounds on each LE.  Pt with most difficulty with posterior/retro stepping but improved somewhat with repetition.  Staggered stance by wall for intermittent support x 15 secs x 3 reps on each side.  Pt with more difficulty when LLE posterior to RLE.  When he does lose balance its posterior so cued pt to elicit stepping strategy to catch balance rather than grasping wall.    Forward step ups x 5 reps with wall for support>step ups with opposite LE march x 5 more  reps on each side. Cues for decreased trunk extension to allow more stability.                                              Pt requires buoyancy of water for support for reduced fall risk and for unloading/reduced stress on joints as pt able to tolerate increased standing and ambulation in water compared to that on land; viscosity of water is needed for resistance for strengthening and current of water provides perturbations for challenge for balance training       PATIENT EDUCATION: Education details: aquatic rationale  Person educated: Patient and Spouse Education method: Explanation, Facilities manager, and Handouts Education comprehension: needs further education  HOME EXERCISE PROGRAM: Access Code: [email or web address] URL: [email or web address] Date: 12/17/2023 Prepared by: Shary Decamp  Exercises - Sit to Stand with Resistance Around Legs  - 2-3 x daily - 7 x weekly - 5 sets - 5 reps - Seated Ankle Dorsiflexion with Resistance  - 1 x daily - 7 x weekly - 3 sets - 10 reps - 3 sec hold  GOALS: Goals reviewed with patient? Yes  SHORT TERM GOALS: Target date: 01/08/2024    Independent with land-based interventions for HEP Baseline: Goal status: INITIAL  2.  Demo improved BLE strength as evident by 5xSTS test < 15 sec to improve mobility/reduce fall risk Baseline: 18 sec Goal status: INITIAL  3.  Manifest improved postural stability per mild sway condition 4 x 30 sec M-CTSIB for safety during ADL Baseline: mod-severe x 30 sec Goal status: INITIAL    LONG TERM GOALS: Target date: 02/11/2024    Demo independence with HEP that include land and aquatic-based activities for improved carryover/self-management at D/C Baseline:  Goal status: INITIAL  2.  Pt to teach-back relevant exercise/physical activity considerations for those with MG to enhance self-efficacy Baseline:  Goal status: INITIAL  3.  Demo low risk for falls per score 50/56 Berg Balance Test Baseline:  40/56 Goal status: INITIAL  4.  to be performed and goal developed Baseline: TBD Goal status: INITIAL  5.  Demo modified independent floor to sit recovery to improve mobility and safety in home Baseline:  Goal status:  INITIAL   ASSESSMENT:  CLINICAL IMPRESSION: Pt presents for first pool session today at [facility].  He tolerated all exercises very well but needs external support and up to min A from PT at times.  Note most LOB posterior with cues for eliciting a step strategy to prevent LOB.    OBJECTIVE IMPAIRMENTS: Abnormal gait, decreased activity tolerance, decreased balance, decreased endurance, decreased knowledge of condition, decreased mobility, difficulty walking, decreased strength, dizziness, impaired sensation, impaired tone, and improper body mechanics.   ACTIVITY LIMITATIONS: carrying, lifting, bending, standing, squatting, stairs, transfers, reach over head, and locomotion level  PARTICIPATION LIMITATIONS: meal prep, cleaning, laundry, interpersonal relationship, shopping, community activity, and exercise routine  PERSONAL FACTORS: Age, Time since onset of injury/illness/exacerbation, and 3+ comorbidities: PMH and current conditions  are also affecting patient's functional outcome.   REHAB POTENTIAL: Good  CLINICAL DECISION MAKING: Evolving/moderate complexity  EVALUATION COMPLEXITY: Moderate  PLAN:  PT FREQUENCY: 2x/week  PT DURATION: 6 weeks  PLANNED INTERVENTIONS: 97110-Therapeutic exercises, 97530- Therapeutic activity, 97112- Neuromuscular re-education, [phone]- Self Care, 98119- Manual therapy, [phone]- Gait training, [phone]- Orthotic Fit/training, [phone]- Canalith repositioning, [id number]- Aquatic Therapy, [phone]- Electrical stimulation (unattended), Patient/Family education, Vestibular training, and DME instructions  PLAN FOR NEXT SESSION: and goal, HEP review and additions, aquatic instructions   Harriet Butte, PT, MPT [facility] [address] [place], [name], [address] Phone: [phone]   Fax:  [phone] 12/23/23, 7:41 AM

## 2023-12-24 ENCOUNTER — Telehealth: Payer: Self-pay | Admitting: *Deleted

## 2023-12-24 NOTE — Progress Notes (Signed)
 Complex Care Management Note Care Guide Note  12/24/2023 Name: SHADEE MONTOYA MRN: [id number] DOB: 10/05/1945   Complex Care Management Outreach Attempts: An unsuccessful telephone outreach was attempted today to offer the patient information about available complex care management services.  Follow Up Plan:  Additional outreach attempts will be made to offer the patient complex care management information and services.   Encounter Outcome:  No Answer  Gwenevere Ghazi  [facility]  [facility], [facility] Guide  Direct Dial: [phone]  Fax [phone]

## 2023-12-25 ENCOUNTER — Ambulatory Visit

## 2023-12-25 DIAGNOSIS — R262 Difficulty in walking, not elsewhere classified: Secondary | ICD-10-CM

## 2023-12-25 DIAGNOSIS — M6281 Muscle weakness (generalized): Secondary | ICD-10-CM

## 2023-12-25 DIAGNOSIS — R2681 Unsteadiness on feet: Secondary | ICD-10-CM

## 2023-12-25 DIAGNOSIS — R2689 Other abnormalities of gait and mobility: Secondary | ICD-10-CM

## 2023-12-25 NOTE — Progress Notes (Signed)
 Complex Care Management Note Care Guide Note  12/25/2023 Name: MODESTO GANOE MRN: [id number] DOB: 06/27/1946   Complex Care Management Outreach Attempts: A second unsuccessful outreach was attempted today to offer the patient with information about available complex care management services.  Follow Up Plan:  Additional outreach attempts will be made to offer the patient complex care management information and services.   Encounter Outcome:  No Answer  Gwenevere Ghazi  [facility]  [facility], [facility] Guide  Direct Dial: [phone]  Fax [phone]

## 2023-12-25 NOTE — Therapy (Signed)
 OUTPATIENT PHYSICAL THERAPY NEURO TREATMENT   Patient Name: Fernando Hess MRN: [id number] DOB:[birth year], 78 y.o., male Today's Date: 12/25/2023   PCP: [name], MD REFERRING PROVIDER: [name], MD  END OF SESSION:  PT End of Session - 12/25/23 0759     Visit Number 3    Number of Visits 13    Date for PT Re-Evaluation 02/11/24    Authorization Type [employer]    Progress Note Due on Visit 10    PT Start Time 0800    PT Stop Time 0845    PT Time Calculation (min) 45 min    Equipment Utilized During Treatment Other (comment)   floatation devices as needed for safety   Activity Tolerance Patient tolerated treatment well    Behavior During Therapy [facility] for tasks assessed/performed             Past Medical History:  Diagnosis Date   Acute renal failure (ARF) ([name]) 09/24/2015   Acute stress disorder 11/13/2021   Atrial premature complexes    CAD (coronary artery disease) of artery bypass graft    Early occlusion of saphenous vein graft to intermediate and marginal branch in February 2007 following bypass grafting    CAD (coronary artery disease), native coronary artery 2017   hx NSTEMI 09-24-2015  s/p  CABG x5 on 10-02-2015;  post op STEMI inferolateral wall,  SVG OM1 and SVG OM2 occluded, distal OM occlusion the calpruit, treated medically // Myoview 7/21: no ischemia, EF 65, low risk   CHF (congestive heart failure) ([name])    CKD (chronic kidney disease), stage III ([name])    Contusion of right knee 03/16/2020   Dyspnea    Elevated troponin    Erectile dysfunction    Esophageal reflux    History of atrial fibrillation    post op CABG 10-02-2015   History of kidney stones    History of non-ST elevation myocardial infarction (NSTEMI) 09/24/2015   s/p  CABG x5   History of ST elevation myocardial infarction (STEMI) 10/22/2015   inferior wall,  post op CABG 10-02-2015   Hyperlipidemia    Hypertension    Left ureteral stone    Mild  atherosclerosis of both carotid arteries    Nephrolithiasis    per CT bilateral non-obstructive calculi   OSA (obstructive sleep apnea)    Peripheral artery disease    LE Arterial US 01/2019: R PTA and ATA occluded; L ATA occluded   RBBB (right bundle branch block)    Renal atrophy, right    Sleep apnea    wears cpap    ST elevation myocardial infarction (STEMI) of inferior wall ([name]) 10/22/2015   Type 2 diabetes mellitus treated with insulin ([name])    followed by pcp   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) 03/01/2008   Wears glasses    Past Surgical History:  Procedure Laterality Date   APPENDECTOMY  1965   CARDIAC CATHETERIZATION N/A 09/26/2015   Procedure: Left Heart Cath and Coronary Angiography;  Surgeon: Lennette Bihari, MD;  Location: [facility] INVASIVE CV LAB;  Service: Cardiovascular;  Laterality: N/A;   CARDIAC CATHETERIZATION N/A 10/22/2015   Procedure: Left Heart Cath and Coronary Angiography;  Surgeon: Tonny Bollman, MD;  Location: [facility] INVASIVE CV LAB;  Service: Cardiovascular;  Laterality: N/A;   CATARACT EXTRACTION W/ INTRAOCULAR LENS  IMPLANT, BILATERAL  2017   COLONOSCOPY     CORONARY ARTERY  BYPASS GRAFT N/A 10/02/2015   Procedure: CORONARY ARTERY BYPASS GRAFTING (CABG) X5 LIMA-LAD; SVG-DIAG; SVG-OM; SVG-PD; SVG-RAMUS TRANSESOPHAGEAL ECHOCARDIOGRAM (TEE) ENDOSCOPIC GREATER SAPHENOUS VEIN  HARVEST BILAT LE;  Surgeon: Kerin Perna, MD;  Location: [facility] OR;  Service: Open Heart Surgery;  Laterality: N/A;   CYSTOSCOPY/URETEROSCOPY/HOLMIUM LASER/STENT PLACEMENT Left 08/10/2018   Procedure: CYSTOSCOPY/URETEROSCOPY/HOLMIUM LASER/STENT PLACEMENT;  Surgeon: Jerilee Field, MD;  Location: [facility];  Service: Urology;  Laterality: Left;   CYSTOSCOPY/URETEROSCOPY/HOLMIUM LASER/STENT PLACEMENT Left 09/10/2018   Procedure: CYSTOSCOPY/URETEROSCOPY/HOLMIUM LASER/STENT EXCHANGE;  Surgeon: Jerilee Field, MD;  Location: [facility] ORS;  Service: Urology;  Laterality: Left;   HIP ARTHROPLASTY Right 12/26/2022   Procedure: RIGHT HEMI HIP ARTHROPLASTY;  Surgeon: Joen Laura, MD;  Location: [facility] OR;  Service: Orthopedics;  Laterality: Right;   LEFT HEART CATHETERIZATION WITH CORONARY ANGIOGRAM N/A 04/13/2014   Procedure: LEFT HEART CATHETERIZATION WITH CORONARY ANGIOGRAM;  Surgeon: Othella Boyer, MD;  Location: [facility] CATH LAB;  Service: Cardiovascular;  Laterality: N/A;   LEG SURGERY Right age 8   closed reduction leg fracture   NASAL SEPTOPLASTY W/ TURBINOPLASTY Bilateral 08/30/2021   Procedure: NASAL SEPTOPLASTY WITH BILATERAL INFERIOR TURBINATE REDUCTION;  Surgeon: Osborn Coho, MD;  Location: [facility] OR;  Service: ENT;  Laterality: Bilateral;   POLYPECTOMY     RIGHT HEART CATH N/A 06/06/2021   Procedure: RIGHT HEART CATH;  Surgeon: Dolores Patty, MD;  Location: [facility] INVASIVE CV LAB;  Service: Cardiovascular;  Laterality: N/A;   TEE WITHOUT CARDIOVERSION N/A 10/02/2015   Procedure: TRANSESOPHAGEAL ECHOCARDIOGRAM (TEE);  Surgeon: Kerin Perna, MD;  Location: [facility] OR;  Service: Open Heart Surgery;  Laterality: N/A;   URETEROSCOPY WITH HOLMIUM LASER LITHOTRIPSY Bilateral 2004;  2005  dr grapey  @[facility]    VASECTOMY     Patient Active Problem List   Diagnosis Date Noted   SOB (shortness of breath) 09/15/2023   BPH (benign prostatic hyperplasia) 02/27/2023   History of anemia due to chronic kidney disease 02/27/2023   COVID-19 02/27/2023   COVID-19 virus infection 02/26/2023   Acute kidney injury superimposed on chronic kidney disease ([name]) 02/03/2023   Transient hypotension 02/03/2023   SIRS (systemic inflammatory response syndrome) ([name]) 02/03/2023   Dementia without behavioral disturbance ([name]) 02/03/2023   Fall at home, initial encounter 02/03/2023   Acute respiratory failure with hypoxia ([name]) 02/03/2023   Hypokalemia 02/03/2023   Prolonged QT interval 02/03/2023   Left ankle pain  01/08/2023   Hematuria 01/08/2023   Fracture of femoral neck, right, closed ([name]) 12/24/2022   Depression with anxiety 12/24/2022   Acute metabolic encephalopathy 09/18/2022   Acute encephalopathy 09/17/2022   AKI (acute kidney injury) ([name]) 09/04/2022   Generalized weakness 08/12/2022   Myasthenia gravis ([name]) 04/21/2022   CKD stage 3b, GFR 30-44 ml/min ([name]) 03/21/2022   Posterior vitreous detachment of both eyes 01/23/2022   Constipation 11/13/2021   Controlled type 2 diabetes mellitus without complication, without long-term current use of insulin ([name]) 11/13/2021   Severe major depression, single episode, without psychotic features ([name]) 11/13/2021   Amaurosis fugax 11/13/2021   Amnesia 11/13/2021   Anxiety 11/13/2021   Cardiac arrhythmia 11/13/2021   Hearing loss 11/13/2021   Hypercoagulable state ([name]) 11/13/2021   Hyperparathyroidism due to renal insufficiency ([name]) 11/13/2021   Moderate aortic stenosis 11/05/2021   Paroxysmal atrial fibrillation ([name]) 11/05/2021   Nasal septal deviation 08/30/2021   Anticoagulated by anticoagulation treatment 07/16/2021   Syncope 05/10/2021   Gastro-esophageal reflux disease without esophagitis 07/25/2020   Globus  pharyngeus 07/25/2020   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) 12/21/2019   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   (HFpEF) heart failure with preserved ejection fraction ([name]) 11/18/2019   OSA on CPAP 03/07/2019   Peripheral artery disease    Intractable vascular headache 11/11/2018   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   Coronary artery disease involving native coronary artery of native heart without angina pectoris 09/24/2015   Moderate nonproliferative diabetic retinopathy of left eye with macular edema associated with type 2 diabetes mellitus ([name]) 03/01/2008   Hyperlipidemia    Essential hypertension    History of kidney stones     ONSET DATE: December 2024 (hospitalized from  falling)  REFERRING DIAG: G70.00 (ICD-10-CM) - Myasthenia gravis without (acute) exacerbation R29.898 (ICD-10-CM) - Other symptoms and signs involving the musculoskeletal system  THERAPY DIAG:  Muscle weakness (generalized)  Unsteadiness on feet  Difficulty in walking, not elsewhere classified  Other abnormalities of gait and mobility  Rationale for Evaluation and Treatment: Rehabilitation  SUBJECTIVE:                                                                                                                                                                                             SUBJECTIVE STATEMENT:  Pt accompanied by: self  PERTINENT HISTORY: DM, HTN, HLD, sleep apnea, PAD, CAD s/p quintuple bypass, CKD, CHF, myasthenia gravis, Afib, dementia, Right hip hemiarthroplasty.  PAIN:  Are you having pain? No  PRECAUTIONS: Fall  RED FLAGS: None   WEIGHT BEARING RESTRICTIONS: No  FALLS: Has patient fallen in last 6 months? Yes. Number of falls 1  LIVING ENVIRONMENT: Lives with: lives with their spouse Lives in: House/apartment Stairs: Yes: Internal: has lift to 2nd floor steps; lift and External: 5 steps; lift to access Has following equipment at home: Dan Humphreys - 4 wheeled, leaves one outside and one inside  PLOF: Independent with basic ADLs, Independent with household mobility with device, and uses seat for shower  PATIENT GOALS: improve balance, dizziness (when standing), SOB/DOE  OBJECTIVE:   TODAY'S TREATMENT: 12/25/23 Activity Comments  Vitals: 157/80, 77 bpm, 94%   : 495 ft, 7/10 SOB   Pt education regarding exercise considerations for MG Provided summary handout  HEP review -cues for active hip abd w/ STS -cues for set-up for DF  Hip abd w/ sidestep 2x10 reps Green loop around knees        [facility] PT Assessment - 12/25/23 0001       6 Minute Walk- Baseline   6 Minute Walk- Baseline yes    BP (mmHg) 157/80  HR (bpm) 77    02 Sat (%RA) 94 %     Modified Borg Scale for Dyspnea 6-      6 Minute walk- Post Test   6 Minute Walk Post Test yes    BP (mmHg) 172/93    HR (bpm) 91    02 Sat (%RA) 98 %    Modified Borg Scale for Dyspnea 7- Severe shortness of breath or very hard breathing      6 minute walk test results    Aerobic Endurance Distance Walked 495             PATIENT EDUCATION: Education details: aquatic rationale  Person educated: Patient and Spouse Education method: Explanation, Facilities manager, and Handouts Education comprehension: needs further education  HOME EXERCISE PROGRAM: Access Code: [email or web address] URL: [email or web address] Date: 12/17/2023 Prepared by: Shary Decamp  Exercises - Sit to Stand with Resistance Around Legs  - 2-3 x daily - 7 x weekly - 5 sets - 5 reps - Seated Ankle Dorsiflexion with Resistance  - 1 x daily - 7 x weekly - 3 sets - 10 reps - 3 sec hold - Side Stepping with Resistance at Thighs and Counter Support  - 2-3 x weekly - 3 sets - 10 reps  Note: Objective measures were completed at Evaluation unless otherwise noted.  Vital signs: 97%, 80 bpm, 113/78 mmHg (seated); 97%, (standing) 112/75 mmHg, 94 bpm, 97% (From evaluation)  DIAGNOSTIC FINDINGS:   COGNITION: Overall cognitive status: Within functional limits for tasks assessed   SENSATION: Reports numbness along right lower leg (knee down)  COORDINATION: WFL, difficulty with heel to shin from proximal hip weakness  EDEMA:  none  MUSCLE TONE:  Appreciate some hypotonia throughout BLE, tib anterior most evident   DTRs:  NT  POSTURE: forward head  LOWER EXTREMITY ROM:     Active  Right Eval Left Eval  Hip flexion 95 100  Hip extension    Hip abduction    Hip adduction    Hip internal rotation    Hip external rotation    Knee flexion 120 120  Knee extension 0 0  Ankle dorsiflexion 10 10  Ankle plantarflexion    Ankle inversion    Ankle eversion     (Blank rows = not tested)  LOWER EXTREMITY  MMT:  (seated resisted tests)  MMT Right Eval Left Eval  Hip flexion 3 3+  Hip extension    Hip abduction 2+/3- 3+  Hip adduction 3+ 3+  Hip internal rotation    Hip external rotation    Knee flexion 4 4  Knee extension 4- 4-  Ankle dorsiflexion 3 3  Ankle plantarflexion 2+ 2+  Ankle inversion    Ankle eversion    (Blank rows = not tested)  BED MOBILITY:  Reports independence  TRANSFERS: Assistive device utilized: Environmental consultant - 4 wheeled  Sit to stand: Complete Independence and Modified independence Stand to sit: Complete Independence and Modified independence Chair to chair: Complete Independence and Modified independence Floor:  NT    CURB:  Level of Assistance: CGA and Min A Assistive device utilized:  HR Curb Comments:   STAIRS: NT--reports they have stair lifts for external/internal stairs at home  GAIT: Gait pattern:  w/ 1OX achieves and step through pattern--without AD ambulates with very short, shuffled steps and right retro-Trendelenberg Distance walked:  Assistive device utilized: Walker - 4 wheeled Level of assistance: Modified independence Comments: deficits in turning  FUNCTIONAL TESTS:  5 times sit to stand: 18.97  sec Berg Balance Scale: 40/56 : TBD  M-CTSIB  Condition 1: Firm Surface, EO 30 Sec, Mild Sway  Condition 2: Firm Surface, EC 30 Sec, Mild and Moderate Sway  Condition 3: Foam Surface, EO 30 Sec, Mild Sway  Condition 4: Foam Surface, EC 30 Sec, Moderate and Severe Sway                                                                                                                                  TREATMENT DATE: 12/22/23  Patient seen for aquatic therapy today.  Treatment took place in water 3.6-4.0 feet deep depending upon activity.  Pt entered and exited the pool via stairs using B rails as needed for support.  Pool temp approx 92 deg.   Warm up: Walking forwards x approx 18' x 4 reps, side stepping x 4 reps and backwards walking x  4 reps with large white barbell for support and min A (CGA) from PT.  Cues for upright posture and increasing step length bilaterally.  Min unsteadiness, no overt LOB.    Marching forward holding barbell for support as above x 2 laps with cues for keeping trunk upright (no extension) and moving slowly to work on modified SLS.  Standing by wall for single UE support (light support from PT on opposite side) performed hip flex/ext (with knee ext) x 10 reps on each side.  LE hip circles CW x 10 reps and CCW x 10 reps again with wall for light support.  Squats holding wall for support (cues for light support) with target of PTs leg when squatting for correct technique (x 10 reps).  Wall bumps without UE support (reaching forward) x 10 reps.  Sit<>stand from pool bench with small step under feet without UE support (reaching forward to encourage forward weight shift) x 10 reps.    Standing with barbell for support (PT assisting somewhat also) performed ant/lateral/post stepping x 4 rounds on each LE.  Pt with most difficulty with posterior/retro stepping but improved somewhat with repetition.  Staggered stance by wall for intermittent support x 15 secs x 3 reps on each side.  Pt with more difficulty when LLE posterior to RLE.  When he does lose balance its posterior so cued pt to elicit stepping strategy to catch balance rather than grasping wall.    Forward step ups x 5 reps with wall for support>step ups with opposite LE march x 5 more reps on each side. Cues for decreased trunk extension to allow more stability.                                              Pt requires buoyancy of water for support for reduced fall risk and for unloading/reduced stress on joints as pt able to tolerate increased standing and ambulation in  water compared to that on land; viscosity of water is needed for resistance for strengthening and current of water provides perturbations for challenge for balance training          GOALS: Goals reviewed with patient? Yes  SHORT TERM GOALS: Target date: 01/08/2024    Independent with land-based interventions for HEP Baseline: Goal status: INITIAL  2.  Demo improved BLE strength as evident by 5xSTS test < 15 sec to improve mobility/reduce fall risk Baseline: 18 sec Goal status: INITIAL  3.  Manifest improved postural stability per mild sway condition 4 x 30 sec M-CTSIB for safety during ADL Baseline: mod-severe x 30 sec Goal status: INITIAL    LONG TERM GOALS: Target date: 02/11/2024    Demo independence with HEP that include land and aquatic-based activities for improved carryover/self-management at D/C Baseline:  Goal status: INITIAL  2.  Pt to teach-back relevant exercise/physical activity considerations for those with MG to enhance self-efficacy Baseline:  Goal status: INITIAL  3.  Demo low risk for falls per score 50/56 Berg Balance Test Baseline: 40/56 Goal status: INITIAL  4.  Demo improved activity tolerance/gait speed per distance of 600 ft during Baseline: 495 ft Goal status: INITIAL  5.  Demo modified independent floor to sit recovery to improve mobility and safety in home Baseline:  Goal status: INITIAL   ASSESSMENT:  CLINICAL IMPRESSION: performed to determine activity tolerance and gait speed with ability to ambulate duration of 6 minutes with SOB not eceeding 7/10 (baseline 6/10) x 495 ft indicating speed of 1.3 ft/sec. Educated on exercise indications for those with MG and provided summary sheet for considerations.  HEP review with need for guidance for sequence and repetitions. Addition of standing hip abd+sidestep to improve single limb support and hip abduction strength to increase single leg stance and stability. Continued sessions to advance POC details  OBJECTIVE IMPAIRMENTS: Abnormal gait, decreased activity tolerance, decreased balance, decreased endurance, decreased knowledge of condition, decreased  mobility, difficulty walking, decreased strength, dizziness, impaired sensation, impaired tone, and improper body mechanics.   ACTIVITY LIMITATIONS: carrying, lifting, bending, standing, squatting, stairs, transfers, reach over head, and locomotion level  PARTICIPATION LIMITATIONS: meal prep, cleaning, laundry, interpersonal relationship, shopping, community activity, and exercise routine  PERSONAL FACTORS: Age, Time since onset of injury/illness/exacerbation, and 3+ comorbidities: PMH and current conditions  are also affecting patient's functional outcome.   REHAB POTENTIAL: Good  CLINICAL DECISION MAKING: Evolving/moderate complexity  EVALUATION COMPLEXITY: Moderate  PLAN:  PT FREQUENCY: 2x/week  PT DURATION: 6 weeks  PLANNED INTERVENTIONS: 97110-Therapeutic exercises, 97530- Therapeutic activity, [id number]- Neuromuscular re-education, 97535- Self Care, [id number]- Manual therapy, [phone]- Gait training, [phone]- Orthotic Fit/training, [phone]- Canalith repositioning, [id number]- Aquatic Therapy, [phone]- Electrical stimulation (unattended), Patient/Family education, Vestibular training, and DME instructions  PLAN FOR NEXT SESSION: HEP review and additions, aquatic instructions   8:49 AM, 12/25/23 M. Shary Decamp, PT, DPT Physical Therapist- [place] Office Number: [phone]

## 2023-12-28 ENCOUNTER — Ambulatory Visit

## 2023-12-28 DIAGNOSIS — R2689 Other abnormalities of gait and mobility: Secondary | ICD-10-CM

## 2023-12-28 DIAGNOSIS — M6281 Muscle weakness (generalized): Secondary | ICD-10-CM

## 2023-12-28 DIAGNOSIS — R262 Difficulty in walking, not elsewhere classified: Secondary | ICD-10-CM

## 2023-12-28 DIAGNOSIS — R2681 Unsteadiness on feet: Secondary | ICD-10-CM

## 2023-12-28 NOTE — Therapy (Signed)
 OUTPATIENT PHYSICAL THERAPY NEURO TREATMENT   Patient Name: Fernando Hess MRN: [id number] DOB:1946-04-25, 78 y.o., male Today's Date: 12/28/2023   PCP: [name], MD REFERRING PROVIDER: [name], MD  END OF SESSION:  PT End of Session - 12/28/23 0756     Visit Number 4    Number of Visits 13    Date for PT Re-Evaluation 02/11/24    Authorization Type [employer]    Progress Note Due on Visit 10    PT Start Time 0800    PT Stop Time 0845    PT Time Calculation (min) 45 min    Equipment Utilized During Treatment Other (comment)   floatation devices as needed for safety   Activity Tolerance Patient tolerated treatment well    Behavior During Therapy [facility] for tasks assessed/performed             Past Medical History:  Diagnosis Date   Acute renal failure (ARF) ([name]) 09/24/2015   Acute stress disorder 11/13/2021   Atrial premature complexes    CAD (coronary artery disease) of artery bypass graft    Early occlusion of saphenous vein graft to intermediate and marginal branch in February 2007 following bypass grafting    CAD (coronary artery disease), native coronary artery 2017   hx NSTEMI 09-24-2015  s/p  CABG x5 on 10-02-2015;  post op STEMI inferolateral wall,  SVG OM1 and SVG OM2 occluded, distal OM occlusion the calpruit, treated medically // Myoview 7/21: no ischemia, EF 65, low risk   CHF (congestive heart failure) ([name])    CKD (chronic kidney disease), stage III ([name])    Contusion of right knee 03/16/2020   Dyspnea    Elevated troponin    Erectile dysfunction    Esophageal reflux    History of atrial fibrillation    post op CABG 10-02-2015   History of kidney stones    History of non-ST elevation myocardial infarction (NSTEMI) 09/24/2015   s/p  CABG x5   History of ST elevation myocardial infarction (STEMI) 10/22/2015   inferior wall,  post op CABG 10-02-2015   Hyperlipidemia    Hypertension    Left ureteral stone    Mild  atherosclerosis of both carotid arteries    Nephrolithiasis    per CT bilateral non-obstructive calculi   OSA (obstructive sleep apnea)    Peripheral artery disease    LE Arterial US 01/2019: R PTA and ATA occluded; L ATA occluded   RBBB (right bundle branch block)    Renal atrophy, right    Sleep apnea    wears cpap    ST elevation myocardial infarction (STEMI) of inferior wall ([name]) 10/22/2015   Type 2 diabetes mellitus treated with insulin ([name])    followed by pcp   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) 03/01/2008   Wears glasses    Past Surgical History:  Procedure Laterality Date   APPENDECTOMY  1965   CARDIAC CATHETERIZATION N/A 09/26/2015   Procedure: Left Heart Cath and Coronary Angiography;  Surgeon: Lennette Bihari, MD;  Location: [facility] INVASIVE CV LAB;  Service: Cardiovascular;  Laterality: N/A;   CARDIAC CATHETERIZATION N/A 10/22/2015   Procedure: Left Heart Cath and Coronary Angiography;  Surgeon: Tonny Bollman, MD;  Location: [facility] INVASIVE CV LAB;  Service: Cardiovascular;  Laterality: N/A;   CATARACT EXTRACTION W/ INTRAOCULAR LENS  IMPLANT, BILATERAL  2017   COLONOSCOPY     CORONARY ARTERY  BYPASS GRAFT N/A 10/02/2015   Procedure: CORONARY ARTERY BYPASS GRAFTING (CABG) X5 LIMA-LAD; SVG-DIAG; SVG-OM; SVG-PD; SVG-RAMUS TRANSESOPHAGEAL ECHOCARDIOGRAM (TEE) ENDOSCOPIC GREATER SAPHENOUS VEIN  HARVEST BILAT LE;  Surgeon: Kerin Perna, MD;  Location: [facility] OR;  Service: Open Heart Surgery;  Laterality: N/A;   CYSTOSCOPY/URETEROSCOPY/HOLMIUM LASER/STENT PLACEMENT Left 08/10/2018   Procedure: CYSTOSCOPY/URETEROSCOPY/HOLMIUM LASER/STENT PLACEMENT;  Surgeon: Jerilee Field, MD;  Location: [facility];  Service: Urology;  Laterality: Left;   CYSTOSCOPY/URETEROSCOPY/HOLMIUM LASER/STENT PLACEMENT Left 09/10/2018   Procedure: CYSTOSCOPY/URETEROSCOPY/HOLMIUM LASER/STENT EXCHANGE;  Surgeon: Jerilee Field, MD;  Location: [facility] ORS;  Service: Urology;  Laterality: Left;   HIP ARTHROPLASTY Right 12/26/2022   Procedure: RIGHT HEMI HIP ARTHROPLASTY;  Surgeon: Joen Laura, MD;  Location: [facility] OR;  Service: Orthopedics;  Laterality: Right;   LEFT HEART CATHETERIZATION WITH CORONARY ANGIOGRAM N/A 04/13/2014   Procedure: LEFT HEART CATHETERIZATION WITH CORONARY ANGIOGRAM;  Surgeon: Othella Boyer, MD;  Location: [facility] CATH LAB;  Service: Cardiovascular;  Laterality: N/A;   LEG SURGERY Right age 89   closed reduction leg fracture   NASAL SEPTOPLASTY W/ TURBINOPLASTY Bilateral 08/30/2021   Procedure: NASAL SEPTOPLASTY WITH BILATERAL INFERIOR TURBINATE REDUCTION;  Surgeon: Osborn Coho, MD;  Location: [facility] OR;  Service: ENT;  Laterality: Bilateral;   POLYPECTOMY     RIGHT HEART CATH N/A 06/06/2021   Procedure: RIGHT HEART CATH;  Surgeon: Dolores Patty, MD;  Location: [facility] INVASIVE CV LAB;  Service: Cardiovascular;  Laterality: N/A;   TEE WITHOUT CARDIOVERSION N/A 10/02/2015   Procedure: TRANSESOPHAGEAL ECHOCARDIOGRAM (TEE);  Surgeon: Kerin Perna, MD;  Location: [facility] OR;  Service: Open Heart Surgery;  Laterality: N/A;   URETEROSCOPY WITH HOLMIUM LASER LITHOTRIPSY Bilateral 2004;  2005  dr grapey  @[facility]    VASECTOMY     Patient Active Problem List   Diagnosis Date Noted   SOB (shortness of breath) 09/15/2023   BPH (benign prostatic hyperplasia) 02/27/2023   History of anemia due to chronic kidney disease 02/27/2023   COVID-19 02/27/2023   COVID-19 virus infection 02/26/2023   Acute kidney injury superimposed on chronic kidney disease ([name]) 02/03/2023   Transient hypotension 02/03/2023   SIRS (systemic inflammatory response syndrome) ([name]) 02/03/2023   Dementia without behavioral disturbance ([name]) 02/03/2023   Fall at home, initial encounter 02/03/2023   Acute respiratory failure with hypoxia ([name]) 02/03/2023   Hypokalemia 02/03/2023   Prolonged QT interval 02/03/2023   Left ankle pain  01/08/2023   Hematuria 01/08/2023   Fracture of femoral neck, right, closed ([name]) 12/24/2022   Depression with anxiety 12/24/2022   Acute metabolic encephalopathy 09/18/2022   Acute encephalopathy 09/17/2022   AKI (acute kidney injury) ([name]) 09/04/2022   Generalized weakness 08/12/2022   Myasthenia gravis ([name]) 04/21/2022   CKD stage 3b, GFR 30-44 ml/min ([name]) 03/21/2022   Posterior vitreous detachment of both eyes 01/23/2022   Constipation 11/13/2021   Controlled type 2 diabetes mellitus without complication, without long-term current use of insulin ([name]) 11/13/2021   Severe major depression, single episode, without psychotic features ([name]) 11/13/2021   Amaurosis fugax 11/13/2021   Amnesia 11/13/2021   Anxiety 11/13/2021   Cardiac arrhythmia 11/13/2021   Hearing loss 11/13/2021   Hypercoagulable state ([name]) 11/13/2021   Hyperparathyroidism due to renal insufficiency ([name]) 11/13/2021   Moderate aortic stenosis 11/05/2021   Paroxysmal atrial fibrillation ([name]) 11/05/2021   Nasal septal deviation 08/30/2021   Anticoagulated by anticoagulation treatment 07/16/2021   Syncope 05/10/2021   Gastro-esophageal reflux disease without esophagitis 07/25/2020   Globus  pharyngeus 07/25/2020   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) 12/21/2019   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   (HFpEF) heart failure with preserved ejection fraction ([name]) 11/18/2019   OSA on CPAP 03/07/2019   Peripheral artery disease    Intractable vascular headache 11/11/2018   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   Coronary artery disease involving native coronary artery of native heart without angina pectoris 09/24/2015   Moderate nonproliferative diabetic retinopathy of left eye with macular edema associated with type 2 diabetes mellitus ([name]) 03/01/2008   Hyperlipidemia    Essential hypertension    History of kidney stones     ONSET DATE: December 2024 (hospitalized from  falling)  REFERRING DIAG: G70.00 (ICD-10-CM) - Myasthenia gravis without (acute) exacerbation R29.898 (ICD-10-CM) - Other symptoms and signs involving the musculoskeletal system  THERAPY DIAG:  Muscle weakness (generalized)  Unsteadiness on feet  Difficulty in walking, not elsewhere classified  Other abnormalities of gait and mobility  Rationale for Evaluation and Treatment: Rehabilitation  SUBJECTIVE:                                                                                                                                                                                             SUBJECTIVE STATEMENT: The wife wants me to use the cane.  Took BP Rx this AM Pt accompanied by: self  PERTINENT HISTORY: DM, HTN, HLD, sleep apnea, PAD, CAD s/p quintuple bypass, CKD, CHF, myasthenia gravis, Afib, dementia, Right hip hemiarthroplasty.  PAIN:  Are you having pain? No  PRECAUTIONS: Fall  RED FLAGS: None   WEIGHT BEARING RESTRICTIONS: No  FALLS: Has patient fallen in last 6 months? Yes. Number of falls 1  LIVING ENVIRONMENT: Lives with: lives with their spouse Lives in: House/apartment Stairs: Yes: Internal: has lift to 2nd floor steps; lift and External: 5 steps; lift to access Has following equipment at home: Otho Blitz - 4 wheeled, leaves one outside and one inside  PLOF: Independent with basic ADLs, Independent with household mobility with device, and uses seat for shower  PATIENT GOALS: improve balance, dizziness (when standing), SOB/DOE  OBJECTIVE:   TODAY'S TREATMENT: 12/28/23 Activity Comments  Vitals: 152/87, 71 bpm   Gait training  -w/ cane along counter: forwards/backwards, sidestepping -feet together EO/EC x 15 sec -semi-tandem x15 sec  LAQ 2x10 5#  Stair taps 2x10 5#, 4" step  Seated hip abd 2x10 Green loop, wide foot BOS for incr ROM to hip abd  Seated ankle DF 2x10 Green loop  Single limb stance 2x15 sec Unable with RLE SLS     TODAY'S TREATMENT:  12/25/23  Activity Comments  Vitals: 157/80, 77 bpm, 94%   : 495 ft, 7/10 SOB   Pt education regarding exercise considerations for MG Provided summary handout  HEP review -cues for active hip abd w/ STS -cues for set-up for DF  Hip abd w/ sidestep 2x10 reps Green loop around knees          PATIENT EDUCATION: Education details: aquatic rationale  Person educated: Patient and Spouse Education method: Explanation, Facilities manager, and Handouts Education comprehension: needs further education  HOME EXERCISE PROGRAM: Access Code: [email or web address] URL: [email or web address] Date: 12/17/2023 Prepared by: Tedd Favorite  Exercises - Sit to Stand with Resistance Around Legs  - 2-3 x daily - 7 x weekly - 5 sets - 5 reps - Seated Ankle Dorsiflexion with Resistance  - 1 x daily - 7 x weekly - 3 sets - 10 reps - 3 sec hold - Side Stepping with Resistance at Thighs and Counter Support  - 2-3 x weekly - 3 sets - 10 reps - Seated Hip Abduction with Resistance  - 1 x daily - 7 x weekly - 3 sets - 10 reps - 2 sec hold  Note: Objective measures were completed at Evaluation unless otherwise noted.  Vital signs: 97%, 80 bpm, 113/78 mmHg (seated); 97%, (standing) 112/75 mmHg, 94 bpm, 97% (From evaluation)  DIAGNOSTIC FINDINGS:   COGNITION: Overall cognitive status: Within functional limits for tasks assessed   SENSATION: Reports numbness along right lower leg (knee down)  COORDINATION: WFL, difficulty with heel to shin from proximal hip weakness  EDEMA:  none  MUSCLE TONE:  Appreciate some hypotonia throughout BLE, tib anterior most evident   DTRs:  NT  POSTURE: forward head  LOWER EXTREMITY ROM:     Active  Right Eval Left Eval  Hip flexion 95 100  Hip extension    Hip abduction    Hip adduction    Hip internal rotation    Hip external rotation    Knee flexion 120 120  Knee extension 0 0  Ankle dorsiflexion 10 10  Ankle plantarflexion    Ankle inversion     Ankle eversion     (Blank rows = not tested)  LOWER EXTREMITY MMT:  (seated resisted tests)  MMT Right Eval Left Eval  Hip flexion 3 3+  Hip extension    Hip abduction 2+/3- 3+  Hip adduction 3+ 3+  Hip internal rotation    Hip external rotation    Knee flexion 4 4  Knee extension 4- 4-  Ankle dorsiflexion 3 3  Ankle plantarflexion 2+ 2+  Ankle inversion    Ankle eversion    (Blank rows = not tested)  BED MOBILITY:  Reports independence  TRANSFERS: Assistive device utilized: Environmental consultant - 4 wheeled  Sit to stand: Complete Independence and Modified independence Stand to sit: Complete Independence and Modified independence Chair to chair: Complete Independence and Modified independence Floor:  NT    CURB:  Level of Assistance: CGA and Min A Assistive device utilized:  HR Curb Comments:   STAIRS: NT--reports they have stair lifts for external/internal stairs at home  GAIT: Gait pattern:  w/ 1OX achieves and step through pattern--without AD ambulates with very short, shuffled steps and right retro-Trendelenberg Distance walked:  Assistive device utilized: Environmental consultant - 4 wheeled Level of assistance: Modified independence Comments: deficits in turning  FUNCTIONAL TESTS:  5 times sit to stand: 18.97 sec Berg Balance Scale: 40/56 : TBD  M-CTSIB  Condition 1: Firm Surface, EO 30 Sec, Mild Sway  Condition 2: Firm Surface, EC 30 Sec, Mild and Moderate Sway  Condition 3: Foam Surface, EO 30 Sec, Mild Sway  Condition 4: Foam Surface, EC 30 Sec, Moderate and Severe Sway                                                                                                                                  TREATMENT DATE: 12/22/23  Patient seen for aquatic therapy today.  Treatment took place in water 3.6-4.0 feet deep depending upon activity.  Pt entered and exited the pool via stairs using B rails as needed for support.  Pool temp approx 92 deg.   Warm up: Walking forwards x  approx 18' x 4 reps, side stepping x 4 reps and backwards walking x 4 reps with large white barbell for support and min A (CGA) from PT.  Cues for upright posture and increasing step length bilaterally.  Min unsteadiness, no overt LOB.    Marching forward holding barbell for support as above x 2 laps with cues for keeping trunk upright (no extension) and moving slowly to work on modified SLS.  Standing by wall for single UE support (light support from PT on opposite side) performed hip flex/ext (with knee ext) x 10 reps on each side.  LE hip circles CW x 10 reps and CCW x 10 reps again with wall for light support.  Squats holding wall for support (cues for light support) with target of PTs leg when squatting for correct technique (x 10 reps).  Wall bumps without UE support (reaching forward) x 10 reps.  Sit<>stand from pool bench with small step under feet without UE support (reaching forward to encourage forward weight shift) x 10 reps.    Standing with barbell for support (PT assisting somewhat also) performed ant/lateral/post stepping x 4 rounds on each LE.  Pt with most difficulty with posterior/retro stepping but improved somewhat with repetition.  Staggered stance by wall for intermittent support x 15 secs x 3 reps on each side.  Pt with more difficulty when LLE posterior to RLE.  When he does lose balance its posterior so cued pt to elicit stepping strategy to catch balance rather than grasping wall.    Forward step ups x 5 reps with wall for support>step ups with opposite LE march x 5 more reps on each side. Cues for decreased trunk extension to allow more stability.                                              Pt requires buoyancy of water for support for reduced fall risk and for unloading/reduced stress on joints as pt able to tolerate increased standing and ambulation in water compared to that on land; viscosity of water is needed for resistance for strengthening and current of water provides  perturbations for challenge for balance training         GOALS: Goals reviewed with patient? Yes  SHORT TERM GOALS: Target date: 01/08/2024    Independent with land-based interventions for HEP Baseline: Goal status: IN PROGRESS  2.  Demo improved BLE strength as evident by 5xSTS test < 15 sec to improve mobility/reduce fall risk Baseline: 18 sec Goal status: INITIAL  3.  Manifest improved postural stability per mild sway condition 4 x 30 sec M-CTSIB for safety during ADL Baseline: mod-severe x 30 sec Goal status: INITIAL    LONG TERM GOALS: Target date: 02/11/2024    Demo independence with HEP that include land and aquatic-based activities for improved carryover/self-management at D/C Baseline:  Goal status: INITIAL  2.  Pt to teach-back relevant exercise/physical activity considerations for those with MG to enhance self-efficacy Baseline:  Goal status: INITIAL  3.  Demo low risk for falls per score 50/56 Berg Balance Test Baseline: 40/56 Goal status: INITIAL  4.  Demo improved activity tolerance/gait speed per distance of 600 ft during Baseline: 495 ft Goal status: INITIAL  5.  Demo modified independent floor to sit recovery to improve mobility and safety in home Baseline:  Goal status: INITIAL   ASSESSMENT:  CLINICAL IMPRESSION: Initiated with gait training trials using SPC demonstrating step-to pattern and decreased speed likely related to right hip abduction weakness limiting single limb stance. Instructed in [facility] PRE with emphasis on unilateral performance to enable adequate rest periods to avoid undue fatigue.  Seated hip abd with modified position to increase resistance to right hip to improve engagement and single limb stability for gait.  HEP reinforced requiring cues and instruction sheets.  Static balance activities to facilitate righting reactions to reduce risk for falls and improve safety with ADL performance.  Continued sessions to progress POC  details and further instruction regarding exercise as it pertains to MG.   OBJECTIVE IMPAIRMENTS: Abnormal gait, decreased activity tolerance, decreased balance, decreased endurance, decreased knowledge of condition, decreased mobility, difficulty walking, decreased strength, dizziness, impaired sensation, impaired tone, and improper body mechanics.   ACTIVITY LIMITATIONS: carrying, lifting, bending, standing, squatting, stairs, transfers, reach over head, and locomotion level  PARTICIPATION LIMITATIONS: meal prep, cleaning, laundry, interpersonal relationship, shopping, community activity, and exercise routine  PERSONAL FACTORS: Age, Time since onset of injury/illness/exacerbation, and 3+ comorbidities: PMH and current conditions  are also affecting patient's functional outcome.   REHAB POTENTIAL: Good  CLINICAL DECISION MAKING: Evolving/moderate complexity  EVALUATION COMPLEXITY: Moderate  PLAN:  PT FREQUENCY: 2x/week  PT DURATION: 6 weeks  PLANNED INTERVENTIONS: 97110-Therapeutic exercises, 97530- Therapeutic activity, [id number]- Neuromuscular re-education, 97535- Self Care, 62130- Manual therapy, [phone]- Gait training, [phone]- Orthotic Fit/training, [phone]- Canalith repositioning, [id number]- Aquatic Therapy, [phone]- Electrical stimulation (unattended), Patient/Family education, Vestibular training, and DME instructions  PLAN FOR NEXT SESSION: HEP review and additions, aquatic instructions   7:56 AM, 12/28/23 M. Kelly Jadalynn Burr, PT, DPT Physical Therapist- [place] Office Number: [phone]

## 2023-12-28 NOTE — Progress Notes (Signed)
 Complex Care Management Note Care Guide Note  12/28/2023 Name: Fernando Hess MRN: [id number] DOB: 04-28-1946   Complex Care Management Outreach Attempts: A third unsuccessful outreach was attempted today to offer the patient with information about available complex care management services.  Follow Up Plan:  No further outreach attempts will be made at this time. We have been unable to contact the patient to offer or enroll patient in complex care management services.  Encounter Outcome:  No Answer  Barnie Bora  [facility]  [facility], [facility] Guide  Direct Dial: [phone]  Fax [phone]

## 2024-01-05 ENCOUNTER — Encounter: Payer: Self-pay | Admitting: Rehabilitation

## 2024-01-05 ENCOUNTER — Ambulatory Visit: Admitting: Rehabilitation

## 2024-01-05 DIAGNOSIS — M6281 Muscle weakness (generalized): Secondary | ICD-10-CM | POA: Diagnosis not present

## 2024-01-05 DIAGNOSIS — R2689 Other abnormalities of gait and mobility: Secondary | ICD-10-CM

## 2024-01-05 DIAGNOSIS — R262 Difficulty in walking, not elsewhere classified: Secondary | ICD-10-CM

## 2024-01-05 DIAGNOSIS — R2681 Unsteadiness on feet: Secondary | ICD-10-CM

## 2024-01-05 NOTE — Therapy (Signed)
 OUTPATIENT PHYSICAL THERAPY NEURO TREATMENT   Patient Name: Fernando Hess MRN: [id number] DOB:Jan 01, 1946, 78 y.o., male Today's Date: 01/05/2024   PCP: Jimmey Mould, MD REFERRING PROVIDER: Jimmey Mould, MD  END OF SESSION:  PT End of Session - 01/05/24 0805     Visit Number 5    Number of Visits 13    Date for PT Re-Evaluation 02/11/24    Authorization Type [employer]    Authorization Time Period 13 visits from 4/3-5/15/25    Authorization - Visit Number 4    Authorization - Number of Visits 13    Progress Note Due on Visit 10    PT Start Time 0835    PT Stop Time 0925    PT Time Calculation (min) 50 min    Equipment Utilized During Treatment Other (comment)   floatation devices as needed for safety   Activity Tolerance Patient tolerated treatment well    Behavior During Therapy [facility] for tasks assessed/performed             Past Medical History:  Diagnosis Date   Acute renal failure (ARF) ([name]) 09/24/2015   Acute stress disorder 11/13/2021   Atrial premature complexes    CAD (coronary artery disease) of artery bypass graft    Early occlusion of saphenous vein graft to intermediate and marginal branch in February 2007 following bypass grafting    CAD (coronary artery disease), native coronary artery 2017   hx NSTEMI 09-24-2015  s/p  CABG x5 on 10-02-2015;  post op STEMI inferolateral wall,  SVG OM1 and SVG OM2 occluded, distal OM occlusion the calpruit, treated medically // Myoview  7/21: no ischemia, EF 65, low risk   CHF (congestive heart failure) ([name])    CKD (chronic kidney disease), stage III ([name])    Contusion of right knee 03/16/2020   Dyspnea    Elevated troponin    Erectile dysfunction    Esophageal reflux    History of atrial fibrillation    post op CABG 10-02-2015   History of kidney stones    History of non-ST elevation myocardial infarction (NSTEMI) 09/24/2015   s/p  CABG x5   History of ST elevation myocardial infarction  (STEMI) 10/22/2015   inferior wall,  post op CABG 10-02-2015   Hyperlipidemia    Hypertension    Left ureteral stone    Mild atherosclerosis of both carotid arteries    Nephrolithiasis    per CT bilateral non-obstructive calculi   OSA (obstructive sleep apnea)    Peripheral artery disease    LE Arterial US  01/2019: R PTA and ATA occluded; L ATA occluded   RBBB (right bundle branch block)    Renal atrophy, right    Sleep apnea    wears cpap    ST elevation myocardial infarction (STEMI) of inferior wall ([name]) 10/22/2015   Type 2 diabetes mellitus treated with insulin  ([name])    followed by pcp   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) 03/01/2008   Wears glasses    Past Surgical History:  Procedure Laterality Date   APPENDECTOMY  1965   CARDIAC CATHETERIZATION N/A 09/26/2015   Procedure: Left Heart Cath and Coronary Angiography;  Surgeon: Millicent Ally, MD;  Location: [facility] INVASIVE CV LAB;  Service: Cardiovascular;  Laterality: N/A;   CARDIAC CATHETERIZATION N/A 10/22/2015   Procedure: Left Heart Cath and Coronary Angiography;  Surgeon: Arnoldo Lapping, MD;  Location: [facility] INVASIVE CV LAB;  Service: Cardiovascular;  Laterality: N/A;   CATARACT EXTRACTION W/ INTRAOCULAR LENS  IMPLANT, BILATERAL  2017   COLONOSCOPY     CORONARY ARTERY BYPASS GRAFT N/A 10/02/2015   Procedure: CORONARY ARTERY BYPASS GRAFTING (CABG) X5 LIMA-LAD; SVG-DIAG; SVG-OM; SVG-PD; SVG-RAMUS TRANSESOPHAGEAL ECHOCARDIOGRAM (TEE) ENDOSCOPIC GREATER SAPHENOUS VEIN  HARVEST BILAT LE;  Surgeon: Heriberto London, MD;  Location: [facility] OR;  Service: Open Heart Surgery;  Laterality: N/A;   CYSTOSCOPY/URETEROSCOPY/HOLMIUM LASER/STENT PLACEMENT Left 08/10/2018   Procedure: CYSTOSCOPY/URETEROSCOPY/HOLMIUM LASER/STENT PLACEMENT;  Surgeon: Christina Coyer, MD;  Location: [facility];  Service: Urology;  Laterality: Left;   CYSTOSCOPY/URETEROSCOPY/HOLMIUM  LASER/STENT PLACEMENT Left 09/10/2018   Procedure: CYSTOSCOPY/URETEROSCOPY/HOLMIUM LASER/STENT EXCHANGE;  Surgeon: Christina Coyer, MD;  Location: [facility]S;  Service: Urology;  Laterality: Left;   HIP ARTHROPLASTY Right 12/26/2022   Procedure: RIGHT HEMI HIP ARTHROPLASTY;  Surgeon: Murleen Arms, MD;  Location: [facility] OR;  Service: Orthopedics;  Laterality: Right;   LEFT HEART CATHETERIZATION WITH CORONARY ANGIOGRAM N/A 04/13/2014   Procedure: LEFT HEART CATHETERIZATION WITH CORONARY ANGIOGRAM;  Surgeon: Dorsey Gault, MD;  Location: [facility] CATH LAB;  Service: Cardiovascular;  Laterality: N/A;   LEG SURGERY Right age 8   closed reduction leg fracture   NASAL SEPTOPLASTY W/ TURBINOPLASTY Bilateral 08/30/2021   Procedure: NASAL SEPTOPLASTY WITH BILATERAL INFERIOR TURBINATE REDUCTION;  Surgeon: Ammon Bales, MD;  Location: [facility] OR;  Service: ENT;  Laterality: Bilateral;   POLYPECTOMY     RIGHT HEART CATH N/A 06/06/2021   Procedure: RIGHT HEART CATH;  Surgeon: Mardell Shade, MD;  Location: [facility] INVASIVE CV LAB;  Service: Cardiovascular;  Laterality: N/A;   TEE WITHOUT CARDIOVERSION N/A 10/02/2015   Procedure: TRANSESOPHAGEAL ECHOCARDIOGRAM (TEE);  Surgeon: Heriberto London, MD;  [facility] OR;  Service: Open Heart Surgery;  Laterality: N/A;   URETEROSCOPY WITH HOLMIUM LASER LITHOTRIPSY Bilateral 2004;  2005  dr Bosie Bye  @[facility]    VASECTOMY     Patient Active Problem List   Diagnosis Date Noted   SOB (shortness of breath) 09/15/2023   BPH (benign prostatic hyperplasia) 02/27/2023   History of anemia due to chronic kidney disease 02/27/2023   COVID-19 02/27/2023   COVID-19 virus infection 02/26/2023   Acute kidney injury superimposed on chronic kidney disease ([name]) 02/03/2023   Transient hypotension 02/03/2023   SIRS (systemic inflammatory response syndrome) ([name]) 02/03/2023   Dementia without behavioral disturbance ([name]) 02/03/2023   Fall at home, initial encounter 02/03/2023   Acute  respiratory failure with hypoxia ([name]) 02/03/2023   Hypokalemia 02/03/2023   Prolonged QT interval 02/03/2023   Left ankle pain 01/08/2023   Hematuria 01/08/2023   Fracture of femoral neck, right, closed ([name]) 12/24/2022   Depression with anxiety 12/24/2022   Acute metabolic encephalopathy 09/18/2022   Acute encephalopathy 09/17/2022   AKI (acute kidney injury) ([name]) 09/04/2022   Generalized weakness 08/12/2022   Myasthenia gravis ([name]) 04/21/2022   CKD stage 3b, GFR 30-44 ml/min ([name]) 03/21/2022   Posterior vitreous detachment of both eyes 01/23/2022   Constipation 11/13/2021   Controlled type 2 diabetes mellitus without complication, without long-term current use of insulin  ([name]) 11/13/2021   Severe major depression, single episode, without psychotic features ([name]) 11/13/2021   Amaurosis fugax 11/13/2021   Amnesia 11/13/2021   Anxiety 11/13/2021   Cardiac arrhythmia 11/13/2021   Hearing loss 11/13/2021   Hypercoagulable state ([name]) 11/13/2021   Hyperparathyroidism due to renal insufficiency ([name]) 11/13/2021   Moderate aortic stenosis 11/05/2021   Paroxysmal atrial fibrillation ([name]) 11/05/2021  Nasal septal deviation 08/30/2021   Anticoagulated by anticoagulation treatment 07/16/2021   Syncope 05/10/2021   Gastro-esophageal reflux disease without esophagitis 07/25/2020   Globus pharyngeus 07/25/2020   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) 12/21/2019   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   (HFpEF) heart failure with preserved ejection fraction ([name]) 11/18/2019   OSA on CPAP 03/07/2019   Peripheral artery disease    Intractable vascular headache 11/11/2018   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   Coronary artery disease involving native coronary artery of native heart without angina pectoris 09/24/2015   Moderate nonproliferative diabetic retinopathy of left eye with macular edema associated with type 2 diabetes mellitus ([name]) 03/01/2008    Hyperlipidemia    Essential hypertension    History of kidney stones     ONSET DATE: December 2024 (hospitalized from falling)  REFERRING DIAG: G70.00 (ICD-10-CM) - Myasthenia gravis without (acute) exacerbation R29.898 (ICD-10-CM) - Other symptoms and signs involving the musculoskeletal system  THERAPY DIAG:  Muscle weakness (generalized)  Unsteadiness on feet  Difficulty in walking, not elsewhere classified  Other abnormalities of gait and mobility  Rationale for Evaluation and Treatment: Rehabilitation  SUBJECTIVE:                                                                                                                                                                                             SUBJECTIVE STATEMENT: Pt presents to pool, reporting doing well this morning, but didn't sleep well last night.   Pt accompanied by: self  PERTINENT HISTORY: DM, HTN, HLD, sleep apnea, PAD, CAD s/p quintuple bypass, CKD, CHF, myasthenia gravis, Afib, dementia, Right hip hemiarthroplasty.  PAIN:  Are you having pain? No  PRECAUTIONS: Fall  RED FLAGS: None   WEIGHT BEARING RESTRICTIONS: No  FALLS: Has patient fallen in last 6 months? Yes. Number of falls 1  LIVING ENVIRONMENT: Lives with: lives with their spouse Lives in: House/apartment Stairs: Yes: Internal: has lift to 2nd floor steps; lift and External: 5 steps; lift to access Has following equipment at home: Walker - 4 wheeled, leaves one outside and one inside  PLOF: Independent with basic ADLs, Independent with household mobility with device, and uses seat for shower  PATIENT GOALS: improve balance, dizziness (when standing), SOB/DOE  OBJECTIVE:  Note: Objective measures were completed at Evaluation unless otherwise noted.  Vital signs: 97%, 80 bpm, 113/78 mmHg (seated); 97%, (standing) 112/75 mmHg, 94 bpm, 97% (From evaluation)  DIAGNOSTIC FINDINGS:   COGNITION: Overall cognitive status: Within  functional limits for tasks assessed   SENSATION: Reports numbness along right lower leg (knee  down)  COORDINATION: WFL, difficulty with heel to shin from proximal hip weakness  EDEMA:  none  MUSCLE TONE:  Appreciate some hypotonia throughout BLE, tib anterior most evident   DTRs:  NT  POSTURE: forward head  LOWER EXTREMITY ROM:     Active  Right Eval Left Eval  Hip flexion 95 100  Hip extension    Hip abduction    Hip adduction    Hip internal rotation    Hip external rotation    Knee flexion 120 120  Knee extension 0 0  Ankle dorsiflexion 10 10  Ankle plantarflexion    Ankle inversion    Ankle eversion     (Blank rows = not tested)  LOWER EXTREMITY MMT:  (seated resisted tests)  MMT Right Eval Left Eval  Hip flexion 3 3+  Hip extension    Hip abduction 2+/3- 3+  Hip adduction 3+ 3+  Hip internal rotation    Hip external rotation    Knee flexion 4 4  Knee extension 4- 4-  Ankle dorsiflexion 3 3  Ankle plantarflexion 2+ 2+  Ankle inversion    Ankle eversion    (Blank rows = not tested)  BED MOBILITY:  Reports independence  TRANSFERS: Assistive device utilized: Environmental consultant - 4 wheeled  Sit to stand: Complete Independence and Modified independence Stand to sit: Complete Independence and Modified independence Chair to chair: Complete Independence and Modified independence Floor:  NT    CURB:  Level of Assistance: CGA and Min A Assistive device utilized:  HR Curb Comments:   STAIRS: NT--reports they have stair lifts for external/internal stairs at home  GAIT: Gait pattern:  w/ 4UJ achieves and step through pattern--without AD ambulates with very short, shuffled steps and right retro-Trendelenberg Distance walked:  Assistive device utilized: Environmental consultant - 4 wheeled Level of assistance: Modified independence Comments: deficits in turning  FUNCTIONAL TESTS:  5 times sit to stand: 18.97 sec Berg Balance Scale: 40/56 : TBD  M-CTSIB   Condition 1: Firm Surface, EO 30 Sec, Mild Sway  Condition 2: Firm Surface, EC 30 Sec, Mild and Moderate Sway  Condition 3: Foam Surface, EO 30 Sec, Mild Sway  Condition 4: Foam Surface, EC 30 Sec, Moderate and Severe Sway                                                                                                                                  TREATMENT DATE: 01/05/24  Patient seen for aquatic therapy today.  Treatment took place in water  3.6-4.0 feet deep depending upon activity.  Pt entered and exited the pool via stairs using B rails as needed for support.  Pool temp approx 92 deg.   Warm up: Walking forwards x approx 18' x 4 reps and backwards walking x 4 reps with large white barbell for support and min A (CGA) from PT.  Today he side stepped holding yellow barbells by his side x 4  laps.  Cues for upright posture and increasing step length bilaterally.  Min unsteadiness, no overt LOB.    Marching forward holding barbell for support as above x 4 laps with cues for keeping trunk upright (no extension) and moving slowly to work on modified SLS.  Standing by wall for single UE support performed hip flex/ext (with knee ext) x 10 reps on each side with cues for only light support.  LE hip circles CW x 10 reps and CCW x 10 reps again with wall for light support.  Squats holding wall for support (cues for light support) with target of PTs leg when squatting for correct technique (x 10 reps).  Wall bumps without UE support (reaching forward) x 10 reps.  Sit<>stand from pool bench with small step under feet without UE support (reaching forward to encourage forward weight shift) x 10 reps.    Standing with only light UE support from PT (HHA) performed ant/lateral/post stepping x 5 rounds on each LE.  Pt did much better with this task today vs previous session.  Staggered stance ant/post weight shifts with barbells for support x 20 reps each side.  Pt needing max cues and demo for correct technique  and eventually therapist providing light facilitation at hip for improved hip extension with forward weight shift.    Forward step ups with opposite LE march x 10 reps on each side. Pt did much better today with much less trunk extension.                                             Pt requires buoyancy of water  for support for reduced fall risk and for unloading/reduced stress on joints as pt able to tolerate increased standing and ambulation in water  compared to that on land; viscosity of water  is needed for resistance for strengthening and current of water  provides perturbations for challenge for balance training       PATIENT EDUCATION: Education details: aquatic rationale  Person educated: Patient and Spouse Education method: Explanation, Facilities manager, and Handouts Education comprehension: needs further education  HOME EXERCISE PROGRAM: Access Code: [email or web address] URL: [email or web address] Date: 12/17/2023 Prepared by: Tedd Favorite  Exercises - Sit to Stand with Resistance Around Legs  - 2-3 x daily - 7 x weekly - 5 sets - 5 reps - Seated Ankle Dorsiflexion with Resistance  - 1 x daily - 7 x weekly - 3 sets - 10 reps - 3 sec hold  GOALS: Goals reviewed with patient? Yes  SHORT TERM GOALS: Target date: 01/08/2024    Independent with land-based interventions for HEP Baseline: Goal status: INITIAL  2.  Demo improved BLE strength as evident by 5xSTS test < 15 sec to improve mobility/reduce fall risk Baseline: 18 sec Goal status: INITIAL  3.  Manifest improved postural stability per mild sway condition 4 x 30 sec M-CTSIB for safety during ADL Baseline: mod-severe x 30 sec Goal status: INITIAL    LONG TERM GOALS: Target date: 02/11/2024    Demo independence with HEP that include land and aquatic-based activities for improved carryover/self-management at D/C Baseline:  Goal status: INITIAL  2.  Pt to teach-back relevant exercise/physical activity  considerations for those with MG to enhance self-efficacy Baseline:  Goal status: INITIAL  3.  Demo low risk for falls per score 50/56 Berg Balance Test Baseline: 40/56 Goal status: INITIAL  4.   to be performed and goal developed Baseline: TBD Goal status: INITIAL  5.  Demo modified independent floor to sit recovery to improve mobility and safety in home Baseline:  Goal status: INITIAL   ASSESSMENT:  CLINICAL IMPRESSION: Pt overall making good gains today in session, needing less external support from PT during gait, side stepping and balance tasks.  Also demonstrates less trunk/head extension but has difficulty eliciting hip extension with weight shifting tasks.     OBJECTIVE IMPAIRMENTS: Abnormal gait, decreased activity tolerance, decreased balance, decreased endurance, decreased knowledge of condition, decreased mobility, difficulty walking, decreased strength, dizziness, impaired sensation, impaired tone, and improper body mechanics.   ACTIVITY LIMITATIONS: carrying, lifting, bending, standing, squatting, stairs, transfers, reach over head, and locomotion level  PARTICIPATION LIMITATIONS: meal prep, cleaning, laundry, interpersonal relationship, shopping, community activity, and exercise routine  PERSONAL FACTORS: Age, Time since onset of injury/illness/exacerbation, and 3+ comorbidities: PMH and current conditions  are also affecting patient's functional outcome.   REHAB POTENTIAL: Good  CLINICAL DECISION MAKING: Evolving/moderate complexity  EVALUATION COMPLEXITY: Moderate  PLAN:  PT FREQUENCY: 2x/week  PT DURATION: 6 weeks  PLANNED INTERVENTIONS: 97110-Therapeutic exercises, 97530- Therapeutic activity, 97112- Neuromuscular re-education, [phone]- Self Care, [id number]- Manual therapy, [phone]- Gait training, [phone]- Orthotic Fit/training, [phone]- Canalith repositioning, [id number]- Aquatic Therapy, [phone]- Electrical stimulation (unattended), Patient/Family education, Vestibular  training, and DME instructions  PLAN FOR NEXT SESSION: Continue balance, endurance, stepping strategy   Terri Fester, PT, MPT [facility] [address] [place], [name], [address] Phone: [phone]   Fax:  [phone] 01/05/24, 11:49 AM

## 2024-01-07 ENCOUNTER — Ambulatory Visit

## 2024-01-07 DIAGNOSIS — R2681 Unsteadiness on feet: Secondary | ICD-10-CM

## 2024-01-07 DIAGNOSIS — R262 Difficulty in walking, not elsewhere classified: Secondary | ICD-10-CM

## 2024-01-07 DIAGNOSIS — R2689 Other abnormalities of gait and mobility: Secondary | ICD-10-CM

## 2024-01-07 DIAGNOSIS — M6281 Muscle weakness (generalized): Secondary | ICD-10-CM

## 2024-01-07 NOTE — Therapy (Signed)
 OUTPATIENT PHYSICAL THERAPY NEURO TREATMENT   Patient Name: Fernando Hess MRN: [id number] DOB:06/02/1946, 78 y.o., male Today's Date: 01/07/2024   PCP: Jimmey Mould, MD REFERRING PROVIDER: Jimmey Mould, MD  END OF SESSION:  PT End of Session - 01/07/24 4098     Visit Number 6    Number of Visits 13    Date for PT Re-Evaluation 02/11/24    Authorization Type [employer]    Authorization Time Period 13 visits from 4/3-5/15/25    Authorization - Visit Number 5    Authorization - Number of Visits 13    Progress Note Due on Visit 10    PT Start Time [phone]   late arrival   PT Stop Time 0845    PT Time Calculation (min) 34 min    Equipment Utilized During Treatment Other (comment)   floatation devices as needed for safety   Activity Tolerance Patient tolerated treatment well    Behavior During Therapy [facility] for tasks assessed/performed             Past Medical History:  Diagnosis Date   Acute renal failure (ARF) ([name]) 09/24/2015   Acute stress disorder 11/13/2021   Atrial premature complexes    CAD (coronary artery disease) of artery bypass graft    Early occlusion of saphenous vein graft to intermediate and marginal branch in February 2007 following bypass grafting    CAD (coronary artery disease), native coronary artery 2017   hx NSTEMI 09-24-2015  s/p  CABG x5 on 10-02-2015;  post op STEMI inferolateral wall,  SVG OM1 and SVG OM2 occluded, distal OM occlusion the calpruit, treated medically // Myoview  7/21: no ischemia, EF 65, low risk   CHF (congestive heart failure) ([name])    CKD (chronic kidney disease), stage III ([name])    Contusion of right knee 03/16/2020   Dyspnea    Elevated troponin    Erectile dysfunction    Esophageal reflux    History of atrial fibrillation    post op CABG 10-02-2015   History of kidney stones    History of non-ST elevation myocardial infarction (NSTEMI) 09/24/2015   s/p  CABG x5   History of ST elevation  myocardial infarction (STEMI) 10/22/2015   inferior wall,  post op CABG 10-02-2015   Hyperlipidemia    Hypertension    Left ureteral stone    Mild atherosclerosis of both carotid arteries    Nephrolithiasis    per CT bilateral non-obstructive calculi   OSA (obstructive sleep apnea)    Peripheral artery disease    LE Arterial US  01/2019: R PTA and ATA occluded; L ATA occluded   RBBB (right bundle branch block)    Renal atrophy, right    Sleep apnea    wears cpap    ST elevation myocardial infarction (STEMI) of inferior wall ([name]) 10/22/2015   Type 2 diabetes mellitus treated with insulin  ([name])    followed by pcp   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) 03/01/2008   Wears glasses    Past Surgical History:  Procedure Laterality Date   APPENDECTOMY  1965   CARDIAC CATHETERIZATION N/A 09/26/2015   Procedure: Left Heart Cath and Coronary Angiography;  Surgeon: Millicent Ally, MD;  Location: [facility] INVASIVE CV LAB;  Service: Cardiovascular;  Laterality: N/A;   CARDIAC CATHETERIZATION N/A 10/22/2015   Procedure: Left Heart Cath and Coronary Angiography;  Surgeon: Arnoldo Lapping, MD;  Location: [facility]  INVASIVE CV LAB;  Service: Cardiovascular;  Laterality: N/A;   CATARACT EXTRACTION W/ INTRAOCULAR LENS  IMPLANT, BILATERAL  2017   COLONOSCOPY     CORONARY ARTERY BYPASS GRAFT N/A 10/02/2015   Procedure: CORONARY ARTERY BYPASS GRAFTING (CABG) X5 LIMA-LAD; SVG-DIAG; SVG-OM; SVG-PD; SVG-RAMUS TRANSESOPHAGEAL ECHOCARDIOGRAM (TEE) ENDOSCOPIC GREATER SAPHENOUS VEIN  HARVEST BILAT LE;  Surgeon: Heriberto London, MD;  Location: [facility] OR;  Service: Open Heart Surgery;  Laterality: N/A;   CYSTOSCOPY/URETEROSCOPY/HOLMIUM LASER/STENT PLACEMENT Left 08/10/2018   Procedure: CYSTOSCOPY/URETEROSCOPY/HOLMIUM LASER/STENT PLACEMENT;  Surgeon: Christina Coyer, MD;  Location: [facility];  Service: Urology;  Laterality: Left;    CYSTOSCOPY/URETEROSCOPY/HOLMIUM LASER/STENT PLACEMENT Left 09/10/2018   Procedure: CYSTOSCOPY/URETEROSCOPY/HOLMIUM LASER/STENT EXCHANGE;  Surgeon: Christina Coyer, MD;  Location: [facility] ORS;  Service: Urology;  Laterality: Left;   HIP ARTHROPLASTY Right 12/26/2022   Procedure: RIGHT HEMI HIP ARTHROPLASTY;  Surgeon: Murleen Arms, MD;  Location: [facility] OR;  Service: Orthopedics;  Laterality: Right;   LEFT HEART CATHETERIZATION WITH CORONARY ANGIOGRAM N/A 04/13/2014   Procedure: LEFT HEART CATHETERIZATION WITH CORONARY ANGIOGRAM;  Surgeon: Dorsey Gault, MD;  Location: [facility] CATH LAB;  Service: Cardiovascular;  Laterality: N/A;   LEG SURGERY Right age 28   closed reduction leg fracture   NASAL SEPTOPLASTY W/ TURBINOPLASTY Bilateral 08/30/2021   Procedure: NASAL SEPTOPLASTY WITH BILATERAL INFERIOR TURBINATE REDUCTION;  Surgeon: Ammon Bales, MD;  Location: [facility] OR;  Service: ENT;  Laterality: Bilateral;   POLYPECTOMY     RIGHT HEART CATH N/A 06/06/2021   Procedure: RIGHT HEART CATH;  Surgeon: Mardell Shade, MD;  Location: [facility] INVASIVE CV LAB;  Service: Cardiovascular;  Laterality: N/A;   TEE WITHOUT CARDIOVERSION N/A 10/02/2015   Procedure: TRANSESOPHAGEAL ECHOCARDIOGRAM (TEE);  Surgeon: Heriberto London, MD;  Location: [facility] OR;  Service: Open Heart Surgery;  Laterality: N/A;   URETEROSCOPY WITH HOLMIUM LASER LITHOTRIPSY Bilateral 2004;  2005  dr grapey  @[facility]    VASECTOMY     Patient Active Problem List   Diagnosis Date Noted   SOB (shortness of breath) 09/15/2023   BPH (benign prostatic hyperplasia) 02/27/2023   History of anemia due to chronic kidney disease 02/27/2023   COVID-19 02/27/2023   COVID-19 virus infection 02/26/2023   Acute kidney injury superimposed on chronic kidney disease ([name]) 02/03/2023   Transient hypotension 02/03/2023   SIRS (systemic inflammatory response syndrome) ([name]) 02/03/2023   Dementia without behavioral disturbance ([name]) 02/03/2023   Fall at home, initial  encounter 02/03/2023   Acute respiratory failure with hypoxia ([name]) 02/03/2023   Hypokalemia 02/03/2023   Prolonged QT interval 02/03/2023   Left ankle pain 01/08/2023   Hematuria 01/08/2023   Fracture of femoral neck, right, closed ([name]) 12/24/2022   Depression with anxiety 12/24/2022   Acute metabolic encephalopathy 09/18/2022   Acute encephalopathy 09/17/2022   AKI (acute kidney injury) ([name]) 09/04/2022   Generalized weakness 08/12/2022   Myasthenia gravis ([name]) 04/21/2022   CKD stage 3b, GFR 30-44 ml/min ([name]) 03/21/2022   Posterior vitreous detachment of both eyes 01/23/2022   Constipation 11/13/2021   Controlled type 2 diabetes mellitus without complication, without long-term current use of insulin  ([name]) 11/13/2021   Severe major depression, single episode, without psychotic features ([name]) 11/13/2021   Amaurosis fugax 11/13/2021   Amnesia 11/13/2021   Anxiety 11/13/2021   Cardiac arrhythmia 11/13/2021   Hearing loss 11/13/2021   Hypercoagulable state ([name]) 11/13/2021   Hyperparathyroidism due to renal insufficiency ([name]) 11/13/2021   Moderate aortic stenosis 11/05/2021   Paroxysmal atrial fibrillation (  [name]) 11/05/2021   Nasal septal deviation 08/30/2021   Anticoagulated by anticoagulation treatment 07/16/2021   Syncope 05/10/2021   Gastro-esophageal reflux disease without esophagitis 07/25/2020   Globus pharyngeus 07/25/2020   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) 12/21/2019   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   (HFpEF) heart failure with preserved ejection fraction ([name]) 11/18/2019   OSA on CPAP 03/07/2019   Peripheral artery disease    Intractable vascular headache 11/11/2018   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   Coronary artery disease involving native coronary artery of native heart without angina pectoris 09/24/2015   Moderate nonproliferative diabetic retinopathy of left eye with macular edema associated with type 2  diabetes mellitus ([name]) 03/01/2008   Hyperlipidemia    Essential hypertension    History of kidney stones     ONSET DATE: December 2024 (hospitalized from falling)  REFERRING DIAG: G70.00 (ICD-10-CM) - Myasthenia gravis without (acute) exacerbation R29.898 (ICD-10-CM) - Other symptoms and signs involving the musculoskeletal system  THERAPY DIAG:  Muscle weakness (generalized)  Unsteadiness on feet  Difficulty in walking, not elsewhere classified  Other abnormalities of gait and mobility  Rationale for Evaluation and Treatment: Rehabilitation  SUBJECTIVE:                                                                                                                                                                                             SUBJECTIVE STATEMENT: Need to practice getting rollator in/out of car  Pt accompanied by: self  PERTINENT HISTORY: DM, HTN, HLD, sleep apnea, PAD, CAD s/p quintuple bypass, CKD, CHF, myasthenia gravis, Afib, dementia, Right hip hemiarthroplasty.  PAIN:  Are you having pain? No  PRECAUTIONS: Fall  RED FLAGS: None   WEIGHT BEARING RESTRICTIONS: No  FALLS: Has patient fallen in last 6 months? Yes. Number of falls 1  LIVING ENVIRONMENT: Lives with: lives with their spouse Lives in: House/apartment Stairs: Yes: Internal: has lift to 2nd floor steps; lift and External: 5 steps; lift to access Has following equipment at home: Otho Blitz - 4 wheeled, leaves one outside and one inside  PLOF: Independent with basic ADLs, Independent with household mobility with device, and uses seat for shower  PATIENT GOALS: improve balance, dizziness (when standing), SOB/DOE  OBJECTIVE:   TODAY'S TREATMENT: 01/07/24 Activity Comments  Trials of folding and lifting rollator in/out of minivan w/ CGA and cues in sequence   LAQ 3x10 4#  STG review See below            PATIENT EDUCATION: Education details: aquatic rationale  Person educated: Patient  and Spouse Education method:  Explanation, Demonstration, and Handouts Education comprehension: needs further education  HOME EXERCISE PROGRAM: Access Code: [email or web address] URL: [email or web address] Date: 12/17/2023 Prepared by: Tedd Favorite  Exercises - Sit to Stand with Resistance Around Legs  - 2-3 x daily - 7 x weekly - 5 sets - 5 reps - Seated Ankle Dorsiflexion with Resistance  - 1 x daily - 7 x weekly - 3 sets - 10 reps - 3 sec hold  - Side Stepping with Resistance at Thighs and Counter Support  - 2-3 x weekly - 3 sets - 10 reps - Seated Hip Abduction with Resistance  - 1 x daily - 7 x weekly - 3 sets - 10 reps - 2 sec hold  Note: Objective measures were completed at Evaluation unless otherwise noted.  Vital signs: 97%, 80 bpm, 113/78 mmHg (seated); 97%, (standing) 112/75 mmHg, 94 bpm, 97% (From evaluation)  DIAGNOSTIC FINDINGS:   COGNITION: Overall cognitive status: Within functional limits for tasks assessed   SENSATION: Reports numbness along right lower leg (knee down)  COORDINATION: WFL, difficulty with heel to shin from proximal hip weakness  EDEMA:  none  MUSCLE TONE:  Appreciate some hypotonia throughout BLE, tib anterior most evident   DTRs:  NT  POSTURE: forward head  LOWER EXTREMITY ROM:     Active  Right Eval Left Eval  Hip flexion 95 100  Hip extension    Hip abduction    Hip adduction    Hip internal rotation    Hip external rotation    Knee flexion 120 120  Knee extension 0 0  Ankle dorsiflexion 10 10  Ankle plantarflexion    Ankle inversion    Ankle eversion     (Blank rows = not tested)  LOWER EXTREMITY MMT:  (seated resisted tests)  MMT Right Eval Left Eval  Hip flexion 3 3+  Hip extension    Hip abduction 2+/3- 3+  Hip adduction 3+ 3+  Hip internal rotation    Hip external rotation    Knee flexion 4 4  Knee extension 4- 4-  Ankle dorsiflexion 3 3  Ankle plantarflexion 2+ 2+  Ankle inversion    Ankle  eversion    (Blank rows = not tested)  BED MOBILITY:  Reports independence  TRANSFERS: Assistive device utilized: Environmental consultant - 4 wheeled  Sit to stand: Complete Independence and Modified independence Stand to sit: Complete Independence and Modified independence Chair to chair: Complete Independence and Modified independence Floor:  NT    CURB:  Level of Assistance: CGA and Min A Assistive device utilized:  HR Curb Comments:   STAIRS: NT--reports they have stair lifts for external/internal stairs at home  GAIT: Gait pattern:  w/ 5HQ achieves and step through pattern--without AD ambulates with very short, shuffled steps and right retro-Trendelenberg Distance walked:  Assistive device utilized: Environmental consultant - 4 wheeled Level of assistance: Modified independence Comments: deficits in turning  FUNCTIONAL TESTS:  5 times sit to stand: 18.97 sec Berg Balance Scale: 40/56 : TBD  M-CTSIB  Condition 1: Firm Surface, EO 30 Sec, Mild Sway  Condition 2: Firm Surface, EC 30 Sec, Mild and Moderate Sway  Condition 3: Foam Surface, EO 30 Sec, Mild Sway  Condition 4: Foam Surface, EC 30 Sec, Moderate and Severe Sway  TREATMENT DATE: 01/05/24  Patient seen for aquatic therapy today.  Treatment took place in water  3.6-4.0 feet deep depending upon activity.  Pt entered and exited the pool via stairs using B rails as needed for support.  Pool temp approx 92 deg.   Warm up: Walking forwards x approx 18' x 4 reps and backwards walking x 4 reps with large white barbell for support and min A (CGA) from PT.  Today he side stepped holding yellow barbells by his side x 4 laps.  Cues for upright posture and increasing step length bilaterally.  Min unsteadiness, no overt LOB.    Marching forward holding barbell for support as above x 4 laps with cues for keeping trunk upright  (no extension) and moving slowly to work on modified SLS.  Standing by wall for single UE support performed hip flex/ext (with knee ext) x 10 reps on each side with cues for only light support.  LE hip circles CW x 10 reps and CCW x 10 reps again with wall for light support.  Squats holding wall for support (cues for light support) with target of PTs leg when squatting for correct technique (x 10 reps).  Wall bumps without UE support (reaching forward) x 10 reps.  Sit<>stand from pool bench with small step under feet without UE support (reaching forward to encourage forward weight shift) x 10 reps.    Standing with only light UE support from PT (HHA) performed ant/lateral/post stepping x 5 rounds on each LE.  Pt did much better with this task today vs previous session.  Staggered stance ant/post weight shifts with barbells for support x 20 reps each side.  Pt needing max cues and demo for correct technique and eventually therapist providing light facilitation at hip for improved hip extension with forward weight shift.    Forward step ups with opposite LE march x 10 reps on each side. Pt did much better today with much less trunk extension.                                             Pt requires buoyancy of water  for support for reduced fall risk and for unloading/reduced stress on joints as pt able to tolerate increased standing and ambulation in water  compared to that on land; viscosity of water  is needed for resistance for strengthening and current of water  provides perturbations for challenge for balance training         GOALS: Goals reviewed with patient? Yes  SHORT TERM GOALS: Target date: 01/08/2024    Independent with land-based interventions for HEP Baseline: Goal status: MET  2.  Demo improved BLE strength as evident by 5xSTS test < 15 sec to improve mobility/reduce fall risk Baseline: 18 sec; (01/07/24) 25 sec--no UE use Goal status: NOT MET  3.  Manifest improved postural  stability per mild sway condition 4 x 30 sec M-CTSIB for safety during ADL Baseline: mod-severe x 30 sec; (01/07/24) mild x 30 sec Goal status: MET    LONG TERM GOALS: Target date: 02/11/2024    Demo independence with HEP that include land and aquatic-based activities for improved carryover/self-management at D/C Baseline:  Goal status: INITIAL  2.  Pt to teach-back relevant exercise/physical activity considerations for those with MG to enhance self-efficacy Baseline:  Goal status: INITIAL  3.  Demo low risk for falls per score 50/56 Randye Buttner  Balance Test Baseline: 40/56 Goal status: INITIAL  4.  to be performed and goal developed Baseline: TBD Goal status: INITIAL  5.  Demo modified independent floor to sit recovery to improve mobility and safety in home Baseline:  Goal status: INITIAL   ASSESSMENT:  CLINICAL IMPRESSION: Difficulty with managing rollator in/out of vehicle due to weakness but also the cumbersome nature.  Modified his rollator for folding strap to enable easier hold and provided reference for "space saver" rollator which is lighter weight and more compact.  HEP review with good recall to activities.  Improved postural stability under M-CTSIB conditions.  5xSTS requiring increased time but able to complete without UE support meeting 2 of 3 STG.  Discussed continuing of HEP for land and aquatic-based intervnetions at D/C and pt reports he has membership to [employer].  Continued sessions to progress POC details to improve functional mobility and activity tolerance.  OBJECTIVE IMPAIRMENTS: Abnormal gait, decreased activity tolerance, decreased balance, decreased endurance, decreased knowledge of condition, decreased mobility, difficulty walking, decreased strength, dizziness, impaired sensation, impaired tone, and improper body mechanics.   ACTIVITY LIMITATIONS: carrying, lifting, bending, standing, squatting, stairs, transfers, reach over head, and locomotion  level  PARTICIPATION LIMITATIONS: meal prep, cleaning, laundry, interpersonal relationship, shopping, community activity, and exercise routine  PERSONAL FACTORS: Age, Time since onset of injury/illness/exacerbation, and 3+ comorbidities: PMH and current conditions  are also affecting patient's functional outcome.   REHAB POTENTIAL: Good  CLINICAL DECISION MAKING: Evolving/moderate complexity  EVALUATION COMPLEXITY: Moderate  PLAN:  PT FREQUENCY: 2x/week  PT DURATION: 6 weeks  PLANNED INTERVENTIONS: 97110-Therapeutic exercises, 97530- Therapeutic activity, 97112- Neuromuscular re-education, [phone]- Self Care, 60454- Manual therapy, [phone]- Gait training, [phone]- Orthotic Fit/training, [phone]- Canalith repositioning, [id number]- Aquatic Therapy, [phone]- Electrical stimulation (unattended), Patient/Family education, Vestibular training, and DME instructions  PLAN FOR NEXT SESSION: Continue balance, endurance, stepping strategy   Terri Fester, PT, MPT [facility] [address] [place], [name], [address] Phone: [phone]   Fax:  [phone] 01/07/24, 8:23 AM

## 2024-01-12 ENCOUNTER — Ambulatory Visit

## 2024-01-12 DIAGNOSIS — R2689 Other abnormalities of gait and mobility: Secondary | ICD-10-CM

## 2024-01-12 DIAGNOSIS — M6281 Muscle weakness (generalized): Secondary | ICD-10-CM | POA: Diagnosis not present

## 2024-01-12 DIAGNOSIS — R2681 Unsteadiness on feet: Secondary | ICD-10-CM

## 2024-01-12 DIAGNOSIS — R262 Difficulty in walking, not elsewhere classified: Secondary | ICD-10-CM

## 2024-01-12 NOTE — Therapy (Signed)
 OUTPATIENT PHYSICAL THERAPY NEURO TREATMENT   Patient Name: Fernando Hess MRN: [id number] DOB:10/01/1945, 78 y.o., male Today's Date: 01/12/2024   PCP: Jimmey Mould, MD REFERRING PROVIDER: Jimmey Mould, MD  END OF SESSION:  PT End of Session - 01/12/24 0805     Visit Number 7    Number of Visits 13    Date for PT Re-Evaluation 02/11/24    Authorization Type [employer]    Authorization Time Period 13 visits from 4/3-5/15/25    Authorization - Visit Number 6    Authorization - Number of Visits 13    Progress Note Due on Visit 10    PT Start Time 0805    PT Stop Time 0850    PT Time Calculation (min) 45 min    Equipment Utilized During Treatment Other (comment)   floatation devices as needed for safety   Activity Tolerance Patient tolerated treatment well    Behavior During Therapy [facility] for tasks assessed/performed             Past Medical History:  Diagnosis Date   Acute renal failure (ARF) ([name]) 09/24/2015   Acute stress disorder 11/13/2021   Atrial premature complexes    CAD (coronary artery disease) of artery bypass graft    Early occlusion of saphenous vein graft to intermediate and marginal branch in February 2007 following bypass grafting    CAD (coronary artery disease), native coronary artery 2017   hx NSTEMI 09-24-2015  s/p  CABG x5 on 10-02-2015;  post op STEMI inferolateral wall,  SVG OM1 and SVG OM2 occluded, distal OM occlusion the calpruit, treated medically // Myoview  7/21: no ischemia, EF 65, low risk   CHF (congestive heart failure) ([name])    CKD (chronic kidney disease), stage III ([name])    Contusion of right knee 03/16/2020   Dyspnea    Elevated troponin    Erectile dysfunction    Esophageal reflux    History of atrial fibrillation    post op CABG 10-02-2015   History of kidney stones    History of non-ST elevation myocardial infarction (NSTEMI) 09/24/2015   s/p  CABG x5   History of ST elevation myocardial infarction  (STEMI) 10/22/2015   inferior wall,  post op CABG 10-02-2015   Hyperlipidemia    Hypertension    Left ureteral stone    Mild atherosclerosis of both carotid arteries    Nephrolithiasis    per CT bilateral non-obstructive calculi   OSA (obstructive sleep apnea)    Peripheral artery disease    LE Arterial US  01/2019: R PTA and ATA occluded; L ATA occluded   RBBB (right bundle branch block)    Renal atrophy, right    Sleep apnea    wears cpap    ST elevation myocardial infarction (STEMI) of inferior wall ([name]) 10/22/2015   Type 2 diabetes mellitus treated with insulin  ([name])    followed by pcp   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) 03/01/2008   Wears glasses    Past Surgical History:  Procedure Laterality Date   APPENDECTOMY  1965   CARDIAC CATHETERIZATION N/A 09/26/2015   Procedure: Left Heart Cath and Coronary Angiography;  Surgeon: Millicent Ally, MD;  Location: [facility] INVASIVE CV LAB;  Service: Cardiovascular;  Laterality: N/A;   CARDIAC CATHETERIZATION N/A 10/22/2015   Procedure: Left Heart Cath and Coronary Angiography;  Surgeon: Arnoldo Lapping, MD;  Location: [facility] INVASIVE CV LAB;  Service: Cardiovascular;  Laterality: N/A;   CATARACT EXTRACTION W/ INTRAOCULAR LENS  IMPLANT, BILATERAL  2017   COLONOSCOPY     CORONARY ARTERY BYPASS GRAFT N/A 10/02/2015   Procedure: CORONARY ARTERY BYPASS GRAFTING (CABG) X5 LIMA-LAD; SVG-DIAG; SVG-OM; SVG-PD; SVG-RAMUS TRANSESOPHAGEAL ECHOCARDIOGRAM (TEE) ENDOSCOPIC GREATER SAPHENOUS VEIN  HARVEST BILAT LE;  Surgeon: Heriberto London, MD;  Location: [facility] OR;  Service: Open Heart Surgery;  Laterality: N/A;   CYSTOSCOPY/URETEROSCOPY/HOLMIUM LASER/STENT PLACEMENT Left 08/10/2018   Procedure: CYSTOSCOPY/URETEROSCOPY/HOLMIUM LASER/STENT PLACEMENT;  Surgeon: Christina Coyer, MD;  Location: [facility];  Service: Urology;  Laterality: Left;   CYSTOSCOPY/URETEROSCOPY/HOLMIUM  LASER/STENT PLACEMENT Left 09/10/2018   Procedure: CYSTOSCOPY/URETEROSCOPY/HOLMIUM LASER/STENT EXCHANGE;  Surgeon: Christina Coyer, MD;  Location: [facility] ORS;  Service: Urology;  Laterality: Left;   HIP ARTHROPLASTY Right 12/26/2022   Procedure: RIGHT HEMI HIP ARTHROPLASTY;  Surgeon: Murleen Arms, MD;  Location: [facility] OR;  Service: Orthopedics;  Laterality: Right;   LEFT HEART CATHETERIZATION WITH CORONARY ANGIOGRAM N/A 04/13/2014   Procedure: LEFT HEART CATHETERIZATION WITH CORONARY ANGIOGRAM;  Surgeon: Dorsey Gault, MD;  [facility] CATH LAB;  Service: Cardiovascular;  Laterality: N/A;   LEG SURGERY Right age 10   closed reduction leg fracture   NASAL SEPTOPLASTY W/ TURBINOPLASTY Bilateral 08/30/2021   Procedure: NASAL SEPTOPLASTY WITH BILATERAL INFERIOR TURBINATE REDUCTION;  Surgeon: Ammon Bales, MD;  Location: [facility] OR;  Service: ENT;  Laterality: Bilateral;   POLYPECTOMY     RIGHT HEART CATH N/A 06/06/2021   Procedure: RIGHT HEART CATH;  Surgeon: Mardell Shade, MD;  Location: [facility] INVASIVE CV LAB;  Service: Cardiovascular;  Laterality: N/A;   TEE WITHOUT CARDIOVERSION N/A 10/02/2015   Procedure: TRANSESOPHAGEAL ECHOCARDIOGRAM (TEE);  Surgeon: Heriberto London, MD;  Location: [facility] OR;  Service: Open Heart Surgery;  Laterality: N/A;   URETEROSCOPY WITH HOLMIUM LASER LITHOTRIPSY Bilateral 2004;  2005  dr grapey  @[facility]    VASECTOMY     Patient Active Problem List   Diagnosis Date Noted   SOB (shortness of breath) 09/15/2023   BPH (benign prostatic hyperplasia) 02/27/2023   History of anemia due to chronic kidney disease 02/27/2023   COVID-19 02/27/2023   COVID-19 virus infection 02/26/2023   Acute kidney injury superimposed on chronic kidney disease ([name]) 02/03/2023   Transient hypotension 02/03/2023   SIRS (systemic inflammatory response syndrome) ([name]) 02/03/2023   Dementia without behavioral disturbance ([name]) 02/03/2023   Fall at home, initial encounter 02/03/2023   Acute  respiratory failure with hypoxia ([name]) 02/03/2023   Hypokalemia 02/03/2023   Prolonged QT interval 02/03/2023   Left ankle pain 01/08/2023   Hematuria 01/08/2023   Fracture of femoral neck, right, closed ([name]) 12/24/2022   Depression with anxiety 12/24/2022   Acute metabolic encephalopathy 09/18/2022   Acute encephalopathy 09/17/2022   AKI (acute kidney injury) ([name]) 09/04/2022   Generalized weakness 08/12/2022   Myasthenia gravis ([name]) 04/21/2022   CKD stage 3b, GFR 30-44 ml/min ([name]) 03/21/2022   Posterior vitreous detachment of both eyes 01/23/2022   Constipation 11/13/2021   Controlled type 2 diabetes mellitus without complication, without long-term current use of insulin  ([name]) 11/13/2021   Severe major depression, single episode, without psychotic features ([name]) 11/13/2021   Amaurosis fugax 11/13/2021   Amnesia 11/13/2021   Anxiety 11/13/2021   Cardiac arrhythmia 11/13/2021   Hearing loss 11/13/2021   Hypercoagulable state ([name]) 11/13/2021   Hyperparathyroidism due to renal insufficiency ([name]) 11/13/2021   Moderate aortic stenosis 11/05/2021   Paroxysmal atrial fibrillation ([name]) 11/05/2021  Nasal septal deviation 08/30/2021   Anticoagulated by anticoagulation treatment 07/16/2021   Syncope 05/10/2021   Gastro-esophageal reflux disease without esophagitis 07/25/2020   Globus pharyngeus 07/25/2020   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) 12/21/2019   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   (HFpEF) heart failure with preserved ejection fraction ([name]) 11/18/2019   OSA on CPAP 03/07/2019   Peripheral artery disease    Intractable vascular headache 11/11/2018   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   Coronary artery disease involving native coronary artery of native heart without angina pectoris 09/24/2015   Moderate nonproliferative diabetic retinopathy of left eye with macular edema associated with type 2 diabetes mellitus ([name]) 03/01/2008    Hyperlipidemia    Essential hypertension    History of kidney stones     ONSET DATE: December 2024 (hospitalized from falling)  REFERRING DIAG: G70.00 (ICD-10-CM) - Myasthenia gravis without (acute) exacerbation R29.898 (ICD-10-CM) - Other symptoms and signs involving the musculoskeletal system  THERAPY DIAG:  Muscle weakness (generalized)  Unsteadiness on feet  Difficulty in walking, not elsewhere classified  Other abnormalities of gait and mobility  Rationale for Evaluation and Treatment: Rehabilitation  SUBJECTIVE:                                                                                                                                                                                             SUBJECTIVE STATEMENT: Very difficult lifting rollator in/out of car   Pt accompanied by: self  PERTINENT HISTORY: DM, HTN, HLD, sleep apnea, PAD, CAD s/p quintuple bypass, CKD, CHF, myasthenia gravis, Afib, dementia, Right hip hemiarthroplasty.  PAIN:  Are you having pain? No  PRECAUTIONS: Fall  RED FLAGS: None   WEIGHT BEARING RESTRICTIONS: No  FALLS: Has patient fallen in last 6 months? Yes. Number of falls 1  LIVING ENVIRONMENT: Lives with: lives with their spouse Lives in: House/apartment Stairs: Yes: Internal: has lift to 2nd floor steps; lift and External: 5 steps; lift to access Has following equipment at home: Otho Blitz - 4 wheeled, leaves one outside and one inside  PLOF: Independent with basic ADLs, Independent with household mobility with device, and uses seat for shower  PATIENT GOALS: improve balance, dizziness (when standing), SOB/DOE  OBJECTIVE:   TODAY'S TREATMENT: 01/12/24 Activity Comments  Calf raise 2x10 BUE support  Foot on step 3x15 sec 4", no UE support for SLS  Sidestep x 1.5 min BUE support  Standing march 2x10 3#, BUE support  Bent over row 2x10 8#  STS w/ 8# goblet 3x5 Chair+airex, 30 sec rest btwn sets Rates 6/10 RPE  LAQ 2x10  3#   Multisensory balance On foam EO/EC, head turns EO/EC. Head turns EO/EC     TODAY'S TREATMENT: 01/07/24 Activity Comments  Trials of folding and lifting rollator in/out of minivan w/ CGA and cues in sequence   LAQ 3x10 4#  STG review See below            PATIENT EDUCATION: Education details: aquatic rationale  Person educated: Patient and Spouse Education method: Explanation, Facilities manager, and Handouts Education comprehension: needs further education  HOME EXERCISE PROGRAM: Access Code: [email or web address] URL: [email or web address] Date: 12/17/2023 Prepared by: Tedd Favorite  Exercises - Sit to Stand with Resistance Around Legs  - 2-3 x daily - 7 x weekly - 5 sets - 5 reps - Seated Ankle Dorsiflexion with Resistance  - 1 x daily - 7 x weekly - 3 sets - 10 reps - 3 sec hold  - Side Stepping with Resistance at Thighs and Counter Support  - 2-3 x weekly - 3 sets - 10 reps - Seated Hip Abduction with Resistance  - 1 x daily - 7 x weekly - 3 sets - 10 reps - 2 sec hold  Note: Objective measures were completed at Evaluation unless otherwise noted.  Vital signs: 97%, 80 bpm, 113/78 mmHg (seated); 97%, (standing) 112/75 mmHg, 94 bpm, 97% (From evaluation)  DIAGNOSTIC FINDINGS:   COGNITION: Overall cognitive status: Within functional limits for tasks assessed   SENSATION: Reports numbness along right lower leg (knee down)  COORDINATION: WFL, difficulty with heel to shin from proximal hip weakness  EDEMA:  none  MUSCLE TONE:  Appreciate some hypotonia throughout BLE, tib anterior most evident   DTRs:  NT  POSTURE: forward head  LOWER EXTREMITY ROM:     Active  Right Eval Left Eval  Hip flexion 95 100  Hip extension    Hip abduction    Hip adduction    Hip internal rotation    Hip external rotation    Knee flexion 120 120  Knee extension 0 0  Ankle dorsiflexion 10 10  Ankle plantarflexion    Ankle inversion    Ankle eversion     (Blank rows = not  tested)  LOWER EXTREMITY MMT:  (seated resisted tests)  MMT Right Eval Left Eval  Hip flexion 3 3+  Hip extension    Hip abduction 2+/3- 3+  Hip adduction 3+ 3+  Hip internal rotation    Hip external rotation    Knee flexion 4 4  Knee extension 4- 4-  Ankle dorsiflexion 3 3  Ankle plantarflexion 2+ 2+  Ankle inversion    Ankle eversion    (Blank rows = not tested)  BED MOBILITY:  Reports independence  TRANSFERS: Assistive device utilized: Environmental consultant - 4 wheeled  Sit to stand: Complete Independence and Modified independence Stand to sit: Complete Independence and Modified independence Chair to chair: Complete Independence and Modified independence Floor:  NT    CURB:  Level of Assistance: CGA and Min A Assistive device utilized:  HR Curb Comments:   STAIRS: NT--reports they have stair lifts for external/internal stairs at home  GAIT: Gait pattern:  w/ 7WG achieves and step through pattern--without AD ambulates with very short, shuffled steps and right retro-Trendelenberg Distance walked:  Assistive device utilized: Environmental consultant - 4 wheeled Level of assistance: Modified independence Comments: deficits in turning  FUNCTIONAL TESTS:  5 times sit to stand: 18.97 sec Berg Balance Scale: 40/56 : TBD  M-CTSIB  Condition 1: Firm Surface, EO 30 Sec, Mild Sway  Condition 2: Firm Surface, EC 30 Sec, Mild and Moderate Sway  Condition 3: Foam Surface, EO 30 Sec, Mild Sway  Condition 4: Foam Surface, EC 30 Sec, Moderate and Severe Sway                                                                                                                                  TREATMENT DATE: 01/05/24  Patient seen for aquatic therapy today.  Treatment took place in water  3.6-4.0 feet deep depending upon activity.  Pt entered and exited the pool via stairs using B rails as needed for support.  Pool temp approx 92 deg.   Warm up: Walking forwards x approx 18' x 4 reps and backwards  walking x 4 reps with large white barbell for support and min A (CGA) from PT.  Today he side stepped holding yellow barbells by his side x 4 laps.  Cues for upright posture and increasing step length bilaterally.  Min unsteadiness, no overt LOB.    Marching forward holding barbell for support as above x 4 laps with cues for keeping trunk upright (no extension) and moving slowly to work on modified SLS.  Standing by wall for single UE support performed hip flex/ext (with knee ext) x 10 reps on each side with cues for only light support.  LE hip circles CW x 10 reps and CCW x 10 reps again with wall for light support.  Squats holding wall for support (cues for light support) with target of PTs leg when squatting for correct technique (x 10 reps).  Wall bumps without UE support (reaching forward) x 10 reps.  Sit<>stand from pool bench with small step under feet without UE support (reaching forward to encourage forward weight shift) x 10 reps.    Standing with only light UE support from PT (HHA) performed ant/lateral/post stepping x 5 rounds on each LE.  Pt did much better with this task today vs previous session.  Staggered stance ant/post weight shifts with barbells for support x 20 reps each side.  Pt needing max cues and demo for correct technique and eventually therapist providing light facilitation at hip for improved hip extension with forward weight shift.    Forward step ups with opposite LE march x 10 reps on each side. Pt did much better today with much less trunk extension.                                             Pt requires buoyancy of water  for support for reduced fall risk and for unloading/reduced stress on joints as pt able to tolerate increased standing and ambulation in water  compared to that on land; viscosity of water  is needed for resistance for strengthening and current of water  provides perturbations for challenge for balance training  GOALS: Goals reviewed with  patient? Yes  SHORT TERM GOALS: Target date: 01/08/2024    Independent with land-based interventions for HEP Baseline: Goal status: MET  2.  Demo improved BLE strength as evident by 5xSTS test < 15 sec to improve mobility/reduce fall risk Baseline: 18 sec; (01/07/24) 25 sec--no UE use Goal status: NOT MET  3.  Manifest improved postural stability per mild sway condition 4 x 30 sec M-CTSIB for safety during ADL Baseline: mod-severe x 30 sec; (01/07/24) mild x 30 sec Goal status: MET    LONG TERM GOALS: Target date: 02/11/2024    Demo independence with HEP that include land and aquatic-based activities for improved carryover/self-management at D/C Baseline:  Goal status: INITIAL  2.  Pt to teach-back relevant exercise/physical activity considerations for those with MG to enhance self-efficacy Baseline:  Goal status: INITIAL  3.  Demo low risk for falls per score 50/56 Berg Balance Test Baseline: 40/56 Goal status: INITIAL  4.  to be performed and goal developed Baseline: TBD Goal status: INITIAL  5.  Demo modified independent floor to sit recovery to improve mobility and safety in home Baseline:  Goal status: INITIAL   ASSESSMENT:  CLINICAL IMPRESSION: Focus on improving strength and activity tolerance with initial activities focus on maintaining standing position with standing rest periods between sets with emphasis on movements to improve step height and single limb support under resistance to enhance functional mobility and safety in stepping over obstacles.  Tolerating increased degree of activity well with report of 6/10 RPE.  Increased sway under multisensory conditions today with addition of head movements under these demands requiring UE support Q 20 sec due to LOB/unsteadiness.  Continued sessions to progress program details to improve activity tolerance and continued education in best methods for improving strength and endurance as it pertains to  MG.  OBJECTIVE IMPAIRMENTS: Abnormal gait, decreased activity tolerance, decreased balance, decreased endurance, decreased knowledge of condition, decreased mobility, difficulty walking, decreased strength, dizziness, impaired sensation, impaired tone, and improper body mechanics.   ACTIVITY LIMITATIONS: carrying, lifting, bending, standing, squatting, stairs, transfers, reach over head, and locomotion level  PARTICIPATION LIMITATIONS: meal prep, cleaning, laundry, interpersonal relationship, shopping, community activity, and exercise routine  PERSONAL FACTORS: Age, Time since onset of injury/illness/exacerbation, and 3+ comorbidities: PMH and current conditions  are also affecting patient's functional outcome.   REHAB POTENTIAL: Good  CLINICAL DECISION MAKING: Evolving/moderate complexity  EVALUATION COMPLEXITY: Moderate  PLAN:  PT FREQUENCY: 2x/week  PT DURATION: 6 weeks  PLANNED INTERVENTIONS: 97110-Therapeutic exercises, 97530- Therapeutic activity, [id number]- Neuromuscular re-education, 97535- Self Care, [id number]- Manual therapy, [phone]- Gait training, [phone]- Orthotic Fit/training, [phone]- Canalith repositioning, [id number]- Aquatic Therapy, [phone]- Electrical stimulation (unattended), Patient/Family education, Vestibular training, and DME instructions  PLAN FOR NEXT SESSION: Continue balance, endurance, stepping strategy  9:09 AM, 01/12/24 M. Kelly Timmy Bubeck, PT, DPT Physical Therapist- [place] Office Number: [phone]

## 2024-01-14 ENCOUNTER — Encounter: Payer: Self-pay | Admitting: Physical Therapy

## 2024-01-14 ENCOUNTER — Ambulatory Visit: Attending: Family Medicine | Admitting: Physical Therapy

## 2024-01-14 DIAGNOSIS — R2681 Unsteadiness on feet: Secondary | ICD-10-CM | POA: Diagnosis present

## 2024-01-14 DIAGNOSIS — M6281 Muscle weakness (generalized): Secondary | ICD-10-CM | POA: Diagnosis present

## 2024-01-14 DIAGNOSIS — R2689 Other abnormalities of gait and mobility: Secondary | ICD-10-CM | POA: Diagnosis present

## 2024-01-14 DIAGNOSIS — R262 Difficulty in walking, not elsewhere classified: Secondary | ICD-10-CM | POA: Insufficient documentation

## 2024-01-14 NOTE — Therapy (Signed)
 OUTPATIENT PHYSICAL THERAPY NEURO TREATMENT - AQUATIC NOTE   Patient Name: Fernando Hess MRN: [id number] DOB:06/08/1946, 78 y.o., male Today's Date: 01/14/2024   PCP: Jimmey Mould, MD REFERRING PROVIDER: Jimmey Mould, MD  END OF SESSION:  PT End of Session - 01/14/24 1322     Visit Number 8    Number of Visits 13    Date for PT Re-Evaluation 02/11/24    Authorization Type [employer]    Authorization Time Period 13 visits from 4/3-5/15/25    Authorization - Number of Visits 13    Progress Note Due on Visit 10    PT Start Time 0845    PT Stop Time 0930    PT Time Calculation (min) 45 min    Equipment Utilized During Treatment Other (comment)   floatation devices as needed for safety   Activity Tolerance Patient tolerated treatment well;Patient limited by fatigue    Behavior During Therapy [facility] for tasks assessed/performed             Past Medical History:  Diagnosis Date   Acute renal failure (ARF) ([name]) 09/24/2015   Acute stress disorder 11/13/2021   Atrial premature complexes    CAD (coronary artery disease) of artery bypass graft    Early occlusion of saphenous vein graft to intermediate and marginal branch in February 2007 following bypass grafting    CAD (coronary artery disease), native coronary artery 2017   hx NSTEMI 09-24-2015  s/p  CABG x5 on 10-02-2015;  post op STEMI inferolateral wall,  SVG OM1 and SVG OM2 occluded, distal OM occlusion the calpruit, treated medically // Myoview  7/21: no ischemia, EF 65, low risk   CHF (congestive heart failure) ([name])    CKD (chronic kidney disease), stage III ([name])    Contusion of right knee 03/16/2020   Dyspnea    Elevated troponin    Erectile dysfunction    Esophageal reflux    History of atrial fibrillation    post op CABG 10-02-2015   History of kidney stones    History of non-ST elevation myocardial infarction (NSTEMI) 09/24/2015   s/p  CABG x5   History of ST elevation myocardial  infarction (STEMI) 10/22/2015   inferior wall,  post op CABG 10-02-2015   Hyperlipidemia    Hypertension    Left ureteral stone    Mild atherosclerosis of both carotid arteries    Nephrolithiasis    per CT bilateral non-obstructive calculi   OSA (obstructive sleep apnea)    Peripheral artery disease    LE Arterial US  01/2019: R PTA and ATA occluded; L ATA occluded   RBBB (right bundle branch block)    Renal atrophy, right    Sleep apnea    wears cpap    ST elevation myocardial infarction (STEMI) of inferior wall ([name]) 10/22/2015   Type 2 diabetes mellitus treated with insulin  ([name])    followed by pcp   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) 03/01/2008   Wears glasses    Past Surgical History:  Procedure Laterality Date   APPENDECTOMY  1965   CARDIAC CATHETERIZATION N/A 09/26/2015   Procedure: Left Heart Cath and Coronary Angiography;  Surgeon: Millicent Ally, MD;  Location: [facility] INVASIVE CV LAB;  Service: Cardiovascular;  Laterality: N/A;   CARDIAC CATHETERIZATION N/A 10/22/2015   Procedure: Left Heart Cath and Coronary Angiography;  Surgeon: Arnoldo Lapping, MD;  Location: [facility] INVASIVE CV LAB;  Service:  Cardiovascular;  Laterality: N/A;   CATARACT EXTRACTION W/ INTRAOCULAR LENS  IMPLANT, BILATERAL  2017   COLONOSCOPY     CORONARY ARTERY BYPASS GRAFT N/A 10/02/2015   Procedure: CORONARY ARTERY BYPASS GRAFTING (CABG) X5 LIMA-LAD; SVG-DIAG; SVG-OM; SVG-PD; SVG-RAMUS TRANSESOPHAGEAL ECHOCARDIOGRAM (TEE) ENDOSCOPIC GREATER SAPHENOUS VEIN  HARVEST BILAT LE;  Surgeon: Heriberto London, MD;  Location: [facility] OR;  Service: Open Heart Surgery;  Laterality: N/A;   CYSTOSCOPY/URETEROSCOPY/HOLMIUM LASER/STENT PLACEMENT Left 08/10/2018   Procedure: CYSTOSCOPY/URETEROSCOPY/HOLMIUM LASER/STENT PLACEMENT;  Surgeon: Christina Coyer, MD;  Location: [facility];  Service: Urology;  Laterality: Left;    CYSTOSCOPY/URETEROSCOPY/HOLMIUM LASER/STENT PLACEMENT Left 09/10/2018   Procedure: CYSTOSCOPY/URETEROSCOPY/HOLMIUM LASER/STENT EXCHANGE;  Surgeon: Christina Coyer, MD;  Location: [facility] ORS;  Service: Urology;  Laterality: Left;   HIP ARTHROPLASTY Right 12/26/2022   Procedure: RIGHT HEMI HIP ARTHROPLASTY;  Surgeon: Murleen Arms, MD;  Location: [facility] OR;  Service: Orthopedics;  Laterality: Right;   LEFT HEART CATHETERIZATION WITH CORONARY ANGIOGRAM N/A 04/13/2014   Procedure: LEFT HEART CATHETERIZATION WITH CORONARY ANGIOGRAM;  Surgeon: Dorsey Gault, MD;  Location: [facility] CATH LAB;  Service: Cardiovascular;  Laterality: N/A;   LEG SURGERY Right age 38   closed reduction leg fracture   NASAL SEPTOPLASTY W/ TURBINOPLASTY Bilateral 08/30/2021   Procedure: NASAL SEPTOPLASTY WITH BILATERAL INFERIOR TURBINATE REDUCTION;  Surgeon: Ammon Bales, MD;  Location: [facility] OR;  Service: ENT;  Laterality: Bilateral;   POLYPECTOMY     RIGHT HEART CATH N/A 06/06/2021   Procedure: RIGHT HEART CATH;  Surgeon: Mardell Shade, MD;  Location: [facility] INVASIVE CV LAB;  Service: Cardiovascular;  Laterality: N/A;   TEE WITHOUT CARDIOVERSION N/A 10/02/2015   Procedure: TRANSESOPHAGEAL ECHOCARDIOGRAM (TEE);  Surgeon: Heriberto London, MD;  Location: [facility] OR;  Service: Open Heart Surgery;  Laterality: N/A;   URETEROSCOPY WITH HOLMIUM LASER LITHOTRIPSY Bilateral 2004;  2005  dr grapey  @[facility]    VASECTOMY     Patient Active Problem List   Diagnosis Date Noted   SOB (shortness of breath) 09/15/2023   BPH (benign prostatic hyperplasia) 02/27/2023   History of anemia due to chronic kidney disease 02/27/2023   COVID-19 02/27/2023   COVID-19 virus infection 02/26/2023   Acute kidney injury superimposed on chronic kidney disease ([name]) 02/03/2023   Transient hypotension 02/03/2023   SIRS (systemic inflammatory response syndrome) ([name]) 02/03/2023   Dementia without behavioral disturbance ([name]) 02/03/2023   Fall at home, initial  encounter 02/03/2023   Acute respiratory failure with hypoxia ([name]) 02/03/2023   Hypokalemia 02/03/2023   Prolonged QT interval 02/03/2023   Left ankle pain 01/08/2023   Hematuria 01/08/2023   Fracture of femoral neck, right, closed ([name]) 12/24/2022   Depression with anxiety 12/24/2022   Acute metabolic encephalopathy 09/18/2022   Acute encephalopathy 09/17/2022   AKI (acute kidney injury) ([name]) 09/04/2022   Generalized weakness 08/12/2022   Myasthenia gravis ([name]) 04/21/2022   CKD stage 3b, GFR 30-44 ml/min ([name]) 03/21/2022   Posterior vitreous detachment of both eyes 01/23/2022   Constipation 11/13/2021   Controlled type 2 diabetes mellitus without complication, without long-term current use of insulin  ([name]) 11/13/2021   Severe major depression, single episode, without psychotic features ([name]) 11/13/2021   Amaurosis fugax 11/13/2021   Amnesia 11/13/2021   Anxiety 11/13/2021   Cardiac arrhythmia 11/13/2021   Hearing loss 11/13/2021   Hypercoagulable state ([name]) 11/13/2021   Hyperparathyroidism due to renal insufficiency ([name]) 11/13/2021   Moderate aortic stenosis 11/05/2021   Paroxysmal atrial fibrillation ([name]) 11/05/2021   Nasal  septal deviation 08/30/2021   Anticoagulated by anticoagulation treatment 07/16/2021   Syncope 05/10/2021   Gastro-esophageal reflux disease without esophagitis 07/25/2020   Globus pharyngeus 07/25/2020   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) 12/21/2019   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   (HFpEF) heart failure with preserved ejection fraction ([name]) 11/18/2019   OSA on CPAP 03/07/2019   Peripheral artery disease    Intractable vascular headache 11/11/2018   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   Coronary artery disease involving native coronary artery of native heart without angina pectoris 09/24/2015   Moderate nonproliferative diabetic retinopathy of left eye with macular edema associated with type 2  diabetes mellitus ([name]) 03/01/2008   Hyperlipidemia    Essential hypertension    History of kidney stones     ONSET DATE: December 2024 (hospitalized from falling)  REFERRING DIAG: G70.00 (ICD-10-CM) - Myasthenia gravis without (acute) exacerbation R29.898 (ICD-10-CM) - Other symptoms and signs involving the musculoskeletal system  THERAPY DIAG:  Muscle weakness (generalized)  Unsteadiness on feet  Difficulty in walking, not elsewhere classified  Other abnormalities of gait and mobility  Rationale for Evaluation and Treatment: Rehabilitation  SUBJECTIVE:                                                                                                                                                                                             SUBJECTIVE STATEMENT: Pt reports his blood sugar was high this morning, but does not recall what it said on the monitor.  He has not eaten breakfast.  He reports feeling very tired today as he did not sleep last night.  He still wants to participate in aquatics.  Pt accompanied by: self  PERTINENT HISTORY: DM, HTN, HLD, sleep apnea, PAD, CAD s/p quintuple bypass, CKD, CHF, myasthenia gravis, Afib, dementia, Right hip hemiarthroplasty.  PAIN:  Are you having pain? Yes: NPRS scale: 6 Pain location: back Pain description: ache Aggravating factors: moving Relieving factors: resting  PRECAUTIONS: Fall  RED FLAGS: None   WEIGHT BEARING RESTRICTIONS: No  FALLS: Has patient fallen in last 6 months? Yes. Number of falls 1  LIVING ENVIRONMENT: Lives with: lives with their spouse Lives in: House/apartment Stairs: Yes: Internal: has lift to 2nd floor steps; lift and External: 5 steps; lift to access Has following equipment at home: Walker - 4 wheeled, leaves one outside and one inside  PLOF: Independent with basic ADLs, Independent with household mobility with device, and uses seat for shower  PATIENT GOALS: improve balance, dizziness (when  standing), SOB/DOE  OBJECTIVE:    TREATMENT DATE: 01/14/24  Patient seen  for aquatic therapy today.  Treatment took place in water  3.6-4.0 feet deep depending upon activity.  Pt entered and exited the pool via stairs using B rails as needed for support.  Pool temp approx 92 deg.   Using large white aquatic barbell pt completes water  walking warmup:  4x18 ft forwards, backwards, and laterally.  -Tried SLS w/ variable holds using barbell, but pt frustrated with challenge, when regressed to more stable wall support pt reports no challenge so redirected task to walking march 4x18 ft w/ emphasis of cuing to slow pace and improve SLS time -Standing reach:  forward w/ facilitation of hip hinge and max cuing to prevent using wall support when not needed > laterally > crossbody into deepened ROM > overhead alternating UE into slight side bend several reps each -W/ wall posterior support pt pulls low resistance multicolor dumbbells to hips and back to surface for core challenge x20 > repeated w/o wall support x20 w/ slowed pace due to challenge -STS w/ low resistance dumbbell pressdown 2x10 SBA -Wall supported plank extension x20 w/ cuing to improve trunk posture    Pt requires buoyancy of water  for support for reduced fall risk and for unloading/reduced stress on joints as pt able to tolerate increased standing and ambulation in water  compared to that on land; viscosity of water  is needed for resistance for strengthening and current of water  provides perturbations for challenge for balance training   TODAY'S TREATMENT: 01/12/24 Activity Comments  Calf raise 2x10 BUE support  Foot on step 3x15 sec 4", no UE support for SLS  Sidestep x 1.5 min BUE support  Standing march 2x10 3#, BUE support  Bent over row 2x10 8#  STS w/ 8# goblet 3x5 Chair+airex, 30 sec rest btwn sets Rates 6/10 RPE  LAQ 2x10  3#  Multisensory balance On foam EO/EC, head turns EO/EC. Head turns EO/EC     TODAY'S TREATMENT:  01/07/24 Activity Comments  Trials of folding and lifting rollator in/out of minivan w/ CGA and cues in sequence   LAQ 3x10 4#  STG review See below            PATIENT EDUCATION: Education details: aquatic rationale  Person educated: Patient and Spouse Education method: Explanation, Facilities manager, and Handouts Education comprehension: needs further education  HOME EXERCISE PROGRAM: Access Code: [email or web address] URL: [email or web address] Date: 12/17/2023 Prepared by: Tedd Favorite  Exercises - Sit to Stand with Resistance Around Legs  - 2-3 x daily - 7 x weekly - 5 sets - 5 reps - Seated Ankle Dorsiflexion with Resistance  - 1 x daily - 7 x weekly - 3 sets - 10 reps - 3 sec hold  - Side Stepping with Resistance at Thighs and Counter Support  - 2-3 x weekly - 3 sets - 10 reps - Seated Hip Abduction with Resistance  - 1 x daily - 7 x weekly - 3 sets - 10 reps - 2 sec hold  Note: Objective measures were completed at Evaluation unless otherwise noted.  Vital signs: 97%, 80 bpm, 113/78 mmHg (seated); 97%, (standing) 112/75 mmHg, 94 bpm, 97% (From evaluation)  DIAGNOSTIC FINDINGS:   COGNITION: Overall cognitive status: Within functional limits for tasks assessed   SENSATION: Reports numbness along right lower leg (knee down)  COORDINATION: WFL, difficulty with heel to shin from proximal hip weakness  EDEMA:  none  MUSCLE TONE:  Appreciate some hypotonia throughout BLE, tib anterior most evident   DTRs:  NT  POSTURE: forward head  LOWER EXTREMITY  ROM:     Active  Right Eval Left Eval  Hip flexion 95 100  Hip extension    Hip abduction    Hip adduction    Hip internal rotation    Hip external rotation    Knee flexion 120 120  Knee extension 0 0  Ankle dorsiflexion 10 10  Ankle plantarflexion    Ankle inversion    Ankle eversion     (Blank rows = not tested)  LOWER EXTREMITY MMT:  (seated resisted tests)  MMT Right Eval Left Eval  Hip flexion 3  3+  Hip extension    Hip abduction 2+/3- 3+  Hip adduction 3+ 3+  Hip internal rotation    Hip external rotation    Knee flexion 4 4  Knee extension 4- 4-  Ankle dorsiflexion 3 3  Ankle plantarflexion 2+ 2+  Ankle inversion    Ankle eversion    (Blank rows = not tested)  BED MOBILITY:  Reports independence  TRANSFERS: Assistive device utilized: Environmental consultant - 4 wheeled  Sit to stand: Complete Independence and Modified independence Stand to sit: Complete Independence and Modified independence Chair to chair: Complete Independence and Modified independence Floor:  NT    CURB:  Level of Assistance: CGA and Min A Assistive device utilized:  HR Curb Comments:   STAIRS: NT--reports they have stair lifts for external/internal stairs at home  GAIT: Gait pattern:  w/ 1OX achieves and step through pattern--without AD ambulates with very short, shuffled steps and right retro-Trendelenberg Distance walked:  Assistive device utilized: Environmental consultant - 4 wheeled Level of assistance: Modified independence Comments: deficits in turning  FUNCTIONAL TESTS:  5 times sit to stand: 18.97 sec Berg Balance Scale: 40/56 : TBD  M-CTSIB  Condition 1: Firm Surface, EO 30 Sec, Mild Sway  Condition 2: Firm Surface, EC 30 Sec, Mild and Moderate Sway  Condition 3: Foam Surface, EO 30 Sec, Mild Sway  Condition 4: Foam Surface, EC 30 Sec, Moderate and Severe Sway                                                                                                                                          GOALS: Goals reviewed with patient? Yes  SHORT TERM GOALS: Target date: 01/08/2024    Independent with land-based interventions for HEP Baseline: Goal status: MET  2.  Demo improved BLE strength as evident by 5xSTS test < 15 sec to improve mobility/reduce fall risk Baseline: 18 sec; (01/07/24) 25 sec--no UE use Goal status: NOT MET  3.  Manifest improved postural stability per mild sway  condition 4 x 30 sec M-CTSIB for safety during ADL Baseline: mod-severe x 30 sec; (01/07/24) mild x 30 sec Goal status: MET    LONG TERM GOALS: Target date: 02/11/2024    Demo independence with HEP that include land and aquatic-based activities for improved carryover/self-management at D/C Baseline:  Goal status: INITIAL  2.  Pt to teach-back relevant exercise/physical activity considerations for those with MG to enhance self-efficacy Baseline:  Goal status: INITIAL  3.  Demo low risk for falls per score 50/56 Berg Balance Test Baseline: 40/56 Goal status: INITIAL  4.  to be performed and goal developed Baseline: TBD Goal status: INITIAL  5.  Demo modified independent floor to sit recovery to improve mobility and safety in home Baseline:  Goal status: INITIAL   ASSESSMENT:  CLINICAL IMPRESSION: Aquatic session completed this visit with pt tolerating all interventions well.  He was somewhat limited by fatigue from lack of sleep and pain remained unchanged in warm water  pool.  He was very challenged by SLS w/ decreased UE support so time spent performing variations of this task for improvement.  He continues to benefit from skilled PT in this setting to improve mobility and optimize functional outcomes.  Continue per POC.  OBJECTIVE IMPAIRMENTS: Abnormal gait, decreased activity tolerance, decreased balance, decreased endurance, decreased knowledge of condition, decreased mobility, difficulty walking, decreased strength, dizziness, impaired sensation, impaired tone, and improper body mechanics.   ACTIVITY LIMITATIONS: carrying, lifting, bending, standing, squatting, stairs, transfers, reach over head, and locomotion level  PARTICIPATION LIMITATIONS: meal prep, cleaning, laundry, interpersonal relationship, shopping, community activity, and exercise routine  PERSONAL FACTORS: Age, Time since onset of injury/illness/exacerbation, and 3+ comorbidities: PMH and current  conditions  are also affecting patient's functional outcome.   REHAB POTENTIAL: Good  CLINICAL DECISION MAKING: Evolving/moderate complexity  EVALUATION COMPLEXITY: Moderate  PLAN:  PT FREQUENCY: 2x/week  PT DURATION: 6 weeks  PLANNED INTERVENTIONS: 97110-Therapeutic exercises, 97530- Therapeutic activity, [id number]- Neuromuscular re-education, 97535- Self Care, [id number]- Manual therapy, [id number]- Gait training, [phone]- Orthotic Fit/training, [phone]- Canalith repositioning, [id number]- Aquatic Therapy, [id number]- Electrical stimulation (unattended), Patient/Family education, Vestibular training, and DME instructions  PLAN FOR NEXT SESSION: Continue balance, endurance, stepping strategy  Marilou Showman, PT, DPT

## 2024-01-19 ENCOUNTER — Ambulatory Visit: Admitting: Rehabilitation

## 2024-01-21 ENCOUNTER — Ambulatory Visit: Payer: Medicare Other | Admitting: Internal Medicine

## 2024-01-22 ENCOUNTER — Ambulatory Visit: Attending: Family Medicine

## 2024-01-22 DIAGNOSIS — R2681 Unsteadiness on feet: Secondary | ICD-10-CM | POA: Diagnosis present

## 2024-01-22 DIAGNOSIS — R262 Difficulty in walking, not elsewhere classified: Secondary | ICD-10-CM | POA: Insufficient documentation

## 2024-01-22 DIAGNOSIS — M6281 Muscle weakness (generalized): Secondary | ICD-10-CM | POA: Diagnosis present

## 2024-01-22 DIAGNOSIS — R2689 Other abnormalities of gait and mobility: Secondary | ICD-10-CM | POA: Insufficient documentation

## 2024-01-22 NOTE — Therapy (Signed)
 OUTPATIENT PHYSICAL THERAPY NEURO TREATMENT - AQUATIC NOTE, Progress Note   Patient Name: Fernando Hess MRN: [id number] DOB:02-11-1946, 78 y.o., male Today's Date: 01/22/2024   PCP: Jimmey Mould, MD REFERRING PROVIDER: Jimmey Mould, MD  Progress Note Reporting Period 12/17/23 to 01/22/24  See note below for Objective Data and Assessment of Progress/Goals.     END OF SESSION:  PT End of Session - 01/22/24 0802     Visit Number 9    Number of Visits 13    Date for PT Re-Evaluation 02/11/24    Authorization Type [employer]    Authorization Time Period 13 visits from 4/3-5/15/25    Authorization - Visit Number 7    Authorization - Number of Visits 13    Progress Note Due on Visit 10    PT Start Time 0800    PT Stop Time 0845    PT Time Calculation (min) 45 min    Equipment Utilized During Treatment Other (comment)   floatation devices as needed for safety   Activity Tolerance Patient tolerated treatment well;Patient limited by fatigue    Behavior During Therapy WFL for tasks assessed/performed             Past Medical History:  Diagnosis Date   Acute renal failure (ARF) ([name]) 09/24/2015   Acute stress disorder 11/13/2021   Atrial premature complexes    CAD (coronary artery disease) of artery bypass graft    Early occlusion of saphenous vein graft to intermediate and marginal branch in February 2007 following bypass grafting    CAD (coronary artery disease), native coronary artery 2017   hx NSTEMI 09-24-2015  s/p  CABG x5 on 10-02-2015;  post op STEMI inferolateral wall,  SVG OM1 and SVG OM2 occluded, distal OM occlusion the calpruit, treated medically // Myoview  7/21: no ischemia, EF 65, low risk   CHF (congestive heart failure) ([name])    CKD (chronic kidney disease), stage III ([name])    Contusion of right knee 03/16/2020   Dyspnea    Elevated troponin    Erectile dysfunction    Esophageal reflux    History of atrial fibrillation    post op  CABG 10-02-2015   History of kidney stones    History of non-ST elevation myocardial infarction (NSTEMI) 09/24/2015   s/p  CABG x5   History of ST elevation myocardial infarction (STEMI) 10/22/2015   inferior wall,  post op CABG 10-02-2015   Hyperlipidemia    Hypertension    Left ureteral stone    Mild atherosclerosis of both carotid arteries    Nephrolithiasis    per CT bilateral non-obstructive calculi   OSA (obstructive sleep apnea)    Peripheral artery disease    LE Arterial US  01/2019: R PTA and ATA occluded; L ATA occluded   RBBB (right bundle branch block)    Renal atrophy, right    Sleep apnea    wears cpap    ST elevation myocardial infarction (STEMI) of inferior wall ([name]) 10/22/2015   Type 2 diabetes mellitus treated with insulin  ([name])    followed by pcp   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 INVASIVE CV LAB;  Service: Cardiovascular;  Laterality: N/A;   CATARACT EXTRACTION W/ INTRAOCULAR LENS  IMPLANT, BILATERAL  2017   COLONOSCOPY     CORONARY ARTERY BYPASS GRAFT N/A 10/02/2015   Procedure: CORONARY ARTERY BYPASS GRAFTING (CABG) X5 LIMA-LAD; SVG-DIAG; SVG-OM; SVG-PD; SVG-RAMUS TRANSESOPHAGEAL ECHOCARDIOGRAM (TEE) ENDOSCOPIC GREATER SAPHENOUS VEIN  HARVEST BILAT LE;  Surgeon: Heriberto London, MD;  Location: [facility] OR;  Service: Open Heart Surgery;  Laterality: N/A;   CYSTOSCOPY/URETEROSCOPY/HOLMIUM LASER/STENT PLACEMENT Left 08/10/2018   Procedure:  CYSTOSCOPY/URETEROSCOPY/HOLMIUM LASER/STENT PLACEMENT;  Surgeon: Christina Coyer, MD;  Location: [facility];  Service: Urology;  Laterality: Left;   CYSTOSCOPY/URETEROSCOPY/HOLMIUM LASER/STENT PLACEMENT Left 09/10/2018   Procedure: CYSTOSCOPY/URETEROSCOPY/HOLMIUM LASER/STENT EXCHANGE;  Surgeon: Christina Coyer, MD;  Location: [facility] ORS;  Service: Urology;  Laterality: Left;   HIP ARTHROPLASTY Right 12/26/2022   Procedure: RIGHT HEMI HIP ARTHROPLASTY;  Surgeon: Murleen Arms, MD;  Location: [facility] OR;  Service: Orthopedics;  Laterality: Right;   LEFT HEART CATHETERIZATION WITH CORONARY ANGIOGRAM N/A 04/13/2014   Procedure: LEFT HEART CATHETERIZATION WITH CORONARY ANGIOGRAM;  Surgeon: Dorsey Gault, MD;  Location: [facility] CATH LAB;  Service: Cardiovascular;  Laterality: N/A;   LEG SURGERY Right age 13   closed reduction leg fracture   NASAL SEPTOPLASTY W/ TURBINOPLASTY Bilateral 08/30/2021   Procedure: NASAL SEPTOPLASTY WITH BILATERAL INFERIOR TURBINATE REDUCTION;  Surgeon: Ammon Bales, MD;  Location: [facility] OR;  Service: ENT;  Laterality: Bilateral;   POLYPECTOMY     RIGHT HEART CATH N/A 06/06/2021   Procedure: RIGHT HEART CATH;  Surgeon: Mardell Shade, MD;  Location: [facility] INVASIVE CV LAB;  Service: Cardiovascular;  Laterality: N/A;   TEE WITHOUT CARDIOVERSION N/A 10/02/2015   Procedure: TRANSESOPHAGEAL ECHOCARDIOGRAM (TEE);  Surgeon: Heriberto London, MD;  Location: [facility] OR;  Service: Open Heart Surgery;  Laterality: N/A;   URETEROSCOPY WITH HOLMIUM LASER LITHOTRIPSY Bilateral 2004;  2005  dr grapey  @[facility]    VASECTOMY     Patient Active Problem List   Diagnosis Date Noted   SOB (shortness of breath) 09/15/2023   BPH (benign prostatic hyperplasia) 02/27/2023   History of anemia due to chronic kidney disease 02/27/2023   COVID-19 02/27/2023   COVID-19 virus infection 02/26/2023   Acute kidney injury superimposed on chronic kidney disease ([name]) 02/03/2023   Transient  hypotension 02/03/2023   SIRS (systemic inflammatory response syndrome) ([name]) 02/03/2023   Dementia without behavioral disturbance ([name]) 02/03/2023   Fall at home, initial encounter 02/03/2023   Acute respiratory failure with hypoxia ([name]) 02/03/2023   Hypokalemia 02/03/2023   Prolonged QT interval 02/03/2023   Left ankle pain 01/08/2023   Hematuria 01/08/2023   Fracture of femoral neck, right, closed ([name]) 12/24/2022   Depression with anxiety 12/24/2022   Acute metabolic encephalopathy 09/18/2022   Acute encephalopathy 09/17/2022   AKI (acute kidney injury) ([name]) 09/04/2022   Generalized weakness 08/12/2022   Myasthenia gravis ([name]) 04/21/2022   CKD stage 3b, GFR 30-44 ml/min ([name]) 03/21/2022   Posterior vitreous detachment of both eyes 01/23/2022   Constipation 11/13/2021   Controlled type 2 diabetes mellitus without complication, without long-term current use of insulin  ([name]) 11/13/2021   Severe major depression, single episode, without psychotic features ([name]) 11/13/2021   Amaurosis fugax 11/13/2021   Amnesia 11/13/2021   Anxiety 11/13/2021   Cardiac arrhythmia 11/13/2021   Hearing loss  11/13/2021   Hypercoagulable state ([name]) 11/13/2021   Hyperparathyroidism due to renal insufficiency ([name]) 11/13/2021   Moderate aortic stenosis 11/05/2021   Paroxysmal atrial fibrillation ([name]) 11/05/2021   Nasal septal deviation 08/30/2021   Anticoagulated by anticoagulation treatment 07/16/2021   Syncope 05/10/2021   Gastro-esophageal reflux disease without esophagitis 07/25/2020   Globus pharyngeus 07/25/2020   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) 12/21/2019   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   (HFpEF) heart failure with preserved ejection fraction ([name]) 11/18/2019   OSA on CPAP 03/07/2019   Peripheral artery disease    Intractable vascular headache 11/11/2018   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   Coronary artery disease involving native  coronary artery of native heart without angina pectoris 09/24/2015   Moderate nonproliferative diabetic retinopathy of left eye with macular edema associated with type 2 diabetes mellitus ([name]) 03/01/2008   Hyperlipidemia    Essential hypertension    History of kidney stones     ONSET DATE: December 2024 (hospitalized from falling)  REFERRING DIAG: G70.00 (ICD-10-CM) - Myasthenia gravis without (acute) exacerbation R29.898 (ICD-10-CM) - Other symptoms and signs involving the musculoskeletal system  THERAPY DIAG:  Muscle weakness (generalized)  Unsteadiness on feet  Difficulty in walking, not elsewhere classified  Other abnormalities of gait and mobility  Rationale for Evaluation and Treatment: Rehabilitation  SUBJECTIVE:                                                                                                                                                                                             SUBJECTIVE STATEMENT: Had a good check up with neurologist reporting his MG is likely in remission and his neuropathy is also improved. Notes his walks in the neighborhood feel about the same. SOB is the main limiting factor  Pt accompanied by: self  PERTINENT HISTORY: DM, HTN, HLD, sleep apnea, PAD, CAD s/p quintuple bypass, CKD, CHF, myasthenia gravis, Afib, dementia, Right hip hemiarthroplasty.  PAIN:  Are you having pain? Yes: NPRS scale: 6 Pain location: back Pain description: ache Aggravating factors: moving Relieving factors: resting  PRECAUTIONS: Fall  RED FLAGS: None   WEIGHT BEARING RESTRICTIONS: No  FALLS: Has patient fallen in last 6 months? Yes. Number of falls 1  LIVING ENVIRONMENT: Lives with: lives with their spouse Lives in: House/apartment Stairs: Yes: Internal: has lift to 2nd floor steps; lift and External: 5 steps; lift to access Has following equipment at home: Otho Blitz - 4 wheeled, leaves one outside and one inside  PLOF: Independent with  basic ADLs, Independent with household mobility with device, and uses seat for  shower  PATIENT GOALS: improve balance, dizziness (when standing), SOB/DOE  OBJECTIVE:   TODAY'S TREATMENT: 01/22/24 Activity Comments  HEP review Able to sit-stand from 18" height w/out UE  Step-ups 2x5 4" step cane+HR--initially tactile cues for facilitation w/ RUE  Lateral step-ups 2x5 Intermittent tactile cues for right hip abd engagement  Seated hamstring curls 1x10 -10# -15#  Multisensory balance   Gait training Trials w/ wide-based cane, cues for sequence to support RLE w/ CGA and instances of LOB and requires step-to pattern      TREATMENT DATE: 01/14/24  Patient seen for aquatic therapy today.  Treatment took place in water  3.6-4.0 feet deep depending upon activity.  Pt entered and exited the pool via stairs using B rails as needed for support.  Pool temp approx 92 deg.   Using large white aquatic barbell pt completes water  walking warmup:  4x18 ft forwards, backwards, and laterally.  -Tried SLS w/ variable holds using barbell, but pt frustrated with challenge, when regressed to more stable wall support pt reports no challenge so redirected task to walking march 4x18 ft w/ emphasis of cuing to slow pace and improve SLS time -Standing reach:  forward w/ facilitation of hip hinge and max cuing to prevent using wall support when not needed > laterally > crossbody into deepened ROM > overhead alternating UE into slight side bend several reps each -W/ wall posterior support pt pulls low resistance multicolor dumbbells to hips and back to surface for core challenge x20 > repeated w/o wall support x20 w/ slowed pace due to challenge -STS w/ low resistance dumbbell pressdown 2x10 SBA -Wall supported plank extension x20 w/ cuing to improve trunk posture    Pt requires buoyancy of water  for support for reduced fall risk and for unloading/reduced stress on joints as pt able to tolerate increased standing and  ambulation in water  compared to that on land; viscosity of water  is needed for resistance for strengthening and current of water  provides perturbations for challenge for balance training     PATIENT EDUCATION: Education details: aquatic rationale  Person educated: Patient and Spouse Education method: Explanation, Facilities manager, and Handouts Education comprehension: needs further education  HOME EXERCISE PROGRAM: Access Code: [email or web address] URL: [email or web address] Date: 12/17/2023 Prepared by: Tedd Favorite  Exercises - Sit to Stand with Resistance Around Legs  - 2-3 x daily - 7 x weekly - 5 sets - 5 reps - Seated Ankle Dorsiflexion with Resistance  - 1 x daily - 7 x weekly - 3 sets - 10 reps - 3 sec hold  - Side Stepping with Resistance at Thighs and Counter Support  - 2-3 x weekly - 3 sets - 10 reps - Seated Hip Abduction with Resistance  - 1 x daily - 7 x weekly - 3 sets - 10 reps - 2 sec hold  Note: Objective measures were completed at Evaluation unless otherwise noted.  Vital signs: 97%, 80 bpm, 113/78 mmHg (seated); 97%, (standing) 112/75 mmHg, 94 bpm, 97% (From evaluation)  DIAGNOSTIC FINDINGS:   COGNITION: Overall cognitive status: Within functional limits for tasks assessed   SENSATION: Reports numbness along right lower leg (knee down)  COORDINATION: WFL, difficulty with heel to shin from proximal hip weakness  EDEMA:  none  MUSCLE TONE:  Appreciate some hypotonia throughout BLE, tib anterior most evident   DTRs:  NT  POSTURE: forward head  LOWER EXTREMITY ROM:     Active  Right Eval Left Eval  Hip flexion 95 100  Hip extension  Hip abduction    Hip adduction    Hip internal rotation    Hip external rotation    Knee flexion 120 120  Knee extension 0 0  Ankle dorsiflexion 10 10  Ankle plantarflexion    Ankle inversion    Ankle eversion     (Blank rows = not tested)  LOWER EXTREMITY MMT:  (seated resisted tests)  MMT Right Eval  Left Eval  Hip flexion 3 3+  Hip extension    Hip abduction 2+/3- 3+  Hip adduction 3+ 3+  Hip internal rotation    Hip external rotation    Knee flexion 4 4  Knee extension 4- 4-  Ankle dorsiflexion 3 3  Ankle plantarflexion 2+ 2+  Ankle inversion    Ankle eversion    (Blank rows = not tested)  BED MOBILITY:  Reports independence  TRANSFERS: Assistive device utilized: Environmental consultant - 4 wheeled  Sit to stand: Complete Independence and Modified independence Stand to sit: Complete Independence and Modified independence Chair to chair: Complete Independence and Modified independence Floor: NT    CURB:  Level of Assistance: CGA and Min A Assistive device utilized: HR Curb Comments:   STAIRS: NT--reports they have stair lifts for external/internal stairs at home  GAIT: Gait pattern: w/ 9UE achieves and step through pattern--without AD ambulates with very short, shuffled steps and right retro-Trendelenberg Distance walked:  Assistive device utilized: Environmental consultant - 4 wheeled Level of assistance: Modified independence Comments: deficits in turning  FUNCTIONAL TESTS:  5 times sit to stand: 18.97 sec Berg Balance Scale: 40/56 : TBD  M-CTSIB  Condition 1: Firm Surface, EO 30 Sec, Mild Sway  Condition 2: Firm Surface, EC 30 Sec, Mild and Moderate Sway  Condition 3: Foam Surface, EO 30 Sec, Mild Sway  Condition 4: Foam Surface, EC 30 Sec, Moderate and Severe Sway                                                                                                                                          GOALS: Goals reviewed with patient? Yes  SHORT TERM GOALS: Target date: 01/08/2024    Independent with land-based interventions for HEP Baseline: Goal status: MET  2.  Demo improved BLE strength as evident by 5xSTS test < 15 sec to improve mobility/reduce fall risk Baseline: 18 sec; (01/07/24) 25 sec--no UE use Goal status: NOT MET  3.  Manifest improved postural  stability per mild sway condition 4 x 30 sec M-CTSIB for safety during ADL Baseline: mod-severe x 30 sec; (01/07/24) mild x 30 sec Goal status: MET    LONG TERM GOALS: Target date: 02/11/2024    Demo independence with HEP that include land and aquatic-based activities for improved carryover/self-management at D/C Baseline:  Goal status: INITIAL  2.  Pt to teach-back relevant exercise/physical activity considerations for those with MG to enhance self-efficacy Baseline:  Goal status: INITIAL  3.  Demo low risk for falls per score 50/56 Berg Balance Test Baseline: 40/56 Goal status: INITIAL  4.  to be performed and goal developed Baseline: TBD Goal status: INITIAL  5.  Demo modified independent floor to sit recovery to improve mobility and safety in home Baseline:  Goal status: INITIAL   ASSESSMENT:  CLINICAL IMPRESSION: Poor recall to PRE HEP with pt reporting limited compliance. Reviewed activities and encouraged to perform one additional day for increased benefit.  Instructed in additional balance and strength activities to improve functional mobility and activity tolerance.  Poor safety and stability w/ cane recommending ongoing use of rollator for safety.  Progressing w/ POC details and has met 2 of 3 STG.   OBJECTIVE IMPAIRMENTS: Abnormal gait, decreased activity tolerance, decreased balance, decreased endurance, decreased knowledge of condition, decreased mobility, difficulty walking, decreased strength, dizziness, impaired sensation, impaired tone, and improper body mechanics.   ACTIVITY LIMITATIONS: carrying, lifting, bending, standing, squatting, stairs, transfers, reach over head, and locomotion level  PARTICIPATION LIMITATIONS: meal prep, cleaning, laundry, interpersonal relationship, shopping, community activity, and exercise routine  PERSONAL FACTORS: Age, Time since onset of injury/illness/exacerbation, and 3+ comorbidities: PMH and current conditions are also  affecting patient's functional outcome.   REHAB POTENTIAL: Good  CLINICAL DECISION MAKING: Evolving/moderate complexity  EVALUATION COMPLEXITY: Moderate  PLAN:  PT FREQUENCY: 2x/week  PT DURATION: 6 weeks  PLANNED INTERVENTIONS: 97110-Therapeutic exercises, 97530- Therapeutic activity, [id number]- Neuromuscular re-education, 97535- Self Care, [id number]- Manual therapy, [phone]- Gait training, [phone]- Orthotic Fit/training, [phone]- Canalith repositioning, [id number]- Aquatic Therapy, [phone]- Electrical stimulation (unattended), Patient/Family education, Vestibular training, and DME instructions  PLAN FOR NEXT SESSION: Continue balance, endurance, stepping strategy  9:25 AM, 01/22/24 M. Kelly Jarin Cornfield, PT, DPT Physical Therapist- [place] Office Number: [phone]

## 2024-01-26 ENCOUNTER — Encounter: Payer: Self-pay | Admitting: Rehabilitation

## 2024-01-26 ENCOUNTER — Ambulatory Visit: Admitting: Rehabilitation

## 2024-01-26 DIAGNOSIS — R2689 Other abnormalities of gait and mobility: Secondary | ICD-10-CM

## 2024-01-26 DIAGNOSIS — R262 Difficulty in walking, not elsewhere classified: Secondary | ICD-10-CM

## 2024-01-26 DIAGNOSIS — R2681 Unsteadiness on feet: Secondary | ICD-10-CM

## 2024-01-26 DIAGNOSIS — M6281 Muscle weakness (generalized): Secondary | ICD-10-CM

## 2024-01-26 NOTE — Therapy (Signed)
 OUTPATIENT PHYSICAL THERAPY NEURO TREATMENT   Patient Name: Fernando Hess MRN: [id number] DOB:08/11/1946, 78 y.o., male Today's Date: 01/26/2024   PCP: Jimmey Mould, MD REFERRING PROVIDER: Jimmey Mould, MD  END OF SESSION:  PT End of Session - 01/26/24 0746     Visit Number 10    Number of Visits 13    Date for PT Re-Evaluation 02/11/24    Authorization Type [employer]    Authorization Time Period 13 visits from 4/3-5/15/25    Authorization - Visit Number 9    Authorization - Number of Visits 13    Progress Note Due on Visit 19    PT Start Time 0830    PT Stop Time 0915    PT Time Calculation (min) 45 min    Equipment Utilized During Treatment Other (comment)   floatation devices as needed for safety   Activity Tolerance Patient tolerated treatment well;Patient limited by fatigue    Behavior During Therapy [facility] for tasks assessed/performed             Past Medical History:  Diagnosis Date   Acute renal failure (ARF) ([name]) 09/24/2015   Acute stress disorder 11/13/2021   Atrial premature complexes    CAD (coronary artery disease) of artery bypass graft    Early occlusion of saphenous vein graft to intermediate and marginal branch in February 2007 following bypass grafting    CAD (coronary artery disease), native coronary artery 2017   hx NSTEMI 09-24-2015  s/p  CABG x5 on 10-02-2015;  post op STEMI inferolateral wall,  SVG OM1 and SVG OM2 occluded, distal OM occlusion the calpruit, treated medically // Myoview  7/21: no ischemia, EF 65, low risk   CHF (congestive heart failure) ([name])    CKD (chronic kidney disease), stage III ([name])    Contusion of right knee 03/16/2020   Dyspnea    Elevated troponin    Erectile dysfunction    Esophageal reflux    History of atrial fibrillation    post op CABG 10-02-2015   History of kidney stones    History of non-ST elevation myocardial infarction (NSTEMI) 09/24/2015   s/p  CABG x5   History of ST  elevation myocardial infarction (STEMI) 10/22/2015   inferior wall,  post op CABG 10-02-2015   Hyperlipidemia    Hypertension    Left ureteral stone    Mild atherosclerosis of both carotid arteries    Nephrolithiasis    per CT bilateral non-obstructive calculi   OSA (obstructive sleep apnea)    Peripheral artery disease    LE Arterial US  01/2019: R PTA and ATA occluded; L ATA occluded   RBBB (right bundle branch block)    Renal atrophy, right    Sleep apnea    wears cpap    ST elevation myocardial infarction (STEMI) of inferior wall ([name]) 10/22/2015   Type 2 diabetes mellitus treated with insulin  ([name])    followed by pcp   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) 03/01/2008   Wears glasses    Past Surgical History:  Procedure Laterality Date   APPENDECTOMY  1965   CARDIAC CATHETERIZATION N/A 09/26/2015   Procedure: Left Heart Cath and Coronary Angiography;  Surgeon: Millicent Ally, MD;  Location: [facility] INVASIVE CV LAB;  Service: Cardiovascular;  Laterality: N/A;   CARDIAC CATHETERIZATION N/A 10/22/2015   Procedure: Left Heart Cath and Coronary Angiography;  Surgeon: Arnoldo Lapping, MD;  Location: [facility]  INVASIVE CV LAB;  Service: Cardiovascular;  Laterality: N/A;   CATARACT EXTRACTION W/ INTRAOCULAR LENS  IMPLANT, BILATERAL  2017   COLONOSCOPY     CORONARY ARTERY BYPASS GRAFT N/A 10/02/2015   Procedure: CORONARY ARTERY BYPASS GRAFTING (CABG) X5 LIMA-LAD; SVG-DIAG; SVG-OM; SVG-PD; SVG-RAMUS TRANSESOPHAGEAL ECHOCARDIOGRAM (TEE) ENDOSCOPIC GREATER SAPHENOUS VEIN  HARVEST BILAT LE;  Surgeon: Heriberto London, MD;  Location: [facility] OR;  Service: Open Heart Surgery;  Laterality: N/A;   CYSTOSCOPY/URETEROSCOPY/HOLMIUM LASER/STENT PLACEMENT Left 08/10/2018   Procedure: CYSTOSCOPY/URETEROSCOPY/HOLMIUM LASER/STENT PLACEMENT;  Surgeon: Christina Coyer, MD;  Location: [facility];  Service: Urology;  Laterality: Left;    CYSTOSCOPY/URETEROSCOPY/HOLMIUM LASER/STENT PLACEMENT Left 09/10/2018   Procedure: CYSTOSCOPY/URETEROSCOPY/HOLMIUM LASER/STENT EXCHANGE;  Surgeon: Christina Coyer, MD;  Location: [facility] ORS;  Service: Urology;  Laterality: Left;   HIP ARTHROPLASTY Right 12/26/2022   Procedure: RIGHT HEMI HIP ARTHROPLASTY;  Surgeon: Murleen Arms, MD;  Location: [facility] OR;  Service: Orthopedics;  Laterality: Right;   LEFT HEART CATHETERIZATION WITH CORONARY ANGIOGRAM N/A 04/13/2014   Procedure: LEFT HEART CATHETERIZATION WITH CORONARY ANGIOGRAM;  Surgeon: Dorsey Gault, MD;  Location: [facility] CATH LAB;  Service: Cardiovascular;  Laterality: N/A;   LEG SURGERY Right age 78   closed reduction leg fracture   NASAL SEPTOPLASTY W/ TURBINOPLASTY Bilateral 08/30/2021   Procedure: NASAL SEPTOPLASTY WITH BILATERAL INFERIOR TURBINATE REDUCTION;  Surgeon: Ammon Bales, MD;  Location: [facility] OR;  Service: ENT;  Laterality: Bilateral;   POLYPECTOMY     RIGHT HEART CATH N/A 06/06/2021   Procedure: RIGHT HEART CATH;  Surgeon: Mardell Shade, MD;  Location: [facility] INVASIVE CV LAB;  Service: Cardiovascular;  Laterality: N/A;   TEE WITHOUT CARDIOVERSION N/A 10/02/2015   Procedure: TRANSESOPHAGEAL ECHOCARDIOGRAM (TEE);  Surgeon: Heriberto London, MD;  Location: [facility] OR;  Service: Open Heart Surgery;  Laterality: N/A;   URETEROSCOPY WITH HOLMIUM LASER LITHOTRIPSY Bilateral 2004;  2005  dr grapey  @[facility]    VASECTOMY     Patient Active Problem List   Diagnosis Date Noted   SOB (shortness of breath) 09/15/2023   BPH (benign prostatic hyperplasia) 02/27/2023   History of anemia due to chronic kidney disease 02/27/2023   COVID-19 02/27/2023   COVID-19 virus infection 02/26/2023   Acute kidney injury superimposed on chronic kidney disease ([name]) 02/03/2023   Transient hypotension 02/03/2023   SIRS (systemic inflammatory response syndrome) ([name]) 02/03/2023   Dementia without behavioral disturbance ([name]) 02/03/2023   Fall at home, initial  encounter 02/03/2023   Acute respiratory failure with hypoxia ([name]) 02/03/2023   Hypokalemia 02/03/2023   Prolonged QT interval 02/03/2023   Left ankle pain 01/08/2023   Hematuria 01/08/2023   Fracture of femoral neck, right, closed ([name]) 12/24/2022   Depression with anxiety 12/24/2022   Acute metabolic encephalopathy 09/18/2022   Acute encephalopathy 09/17/2022   AKI (acute kidney injury) ([name]) 09/04/2022   Generalized weakness 08/12/2022   Myasthenia gravis ([name]) 04/21/2022   CKD stage 3b, GFR 30-44 ml/min ([name]) 03/21/2022   Posterior vitreous detachment of both eyes 01/23/2022   Constipation 11/13/2021   Controlled type 2 diabetes mellitus without complication, without long-term current use of insulin  ([name]) 11/13/2021   Severe major depression, single episode, without psychotic features ([name]) 11/13/2021   Amaurosis fugax 11/13/2021   Amnesia 11/13/2021   Anxiety 11/13/2021   Cardiac arrhythmia 11/13/2021   Hearing loss 11/13/2021   Hypercoagulable state ([name]) 11/13/2021   Hyperparathyroidism due to renal insufficiency ([name]) 11/13/2021   Moderate aortic stenosis 11/05/2021   Paroxysmal atrial fibrillation (  [name]) 11/05/2021   Nasal septal deviation 08/30/2021   Anticoagulated by anticoagulation treatment 07/16/2021   Syncope 05/10/2021   Gastro-esophageal reflux disease without esophagitis 07/25/2020   Globus pharyngeus 07/25/2020   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) 12/21/2019   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   (HFpEF) heart failure with preserved ejection fraction ([name]) 11/18/2019   OSA on CPAP 03/07/2019   Peripheral artery disease    Intractable vascular headache 11/11/2018   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   Coronary artery disease involving native coronary artery of native heart without angina pectoris 09/24/2015   Moderate nonproliferative diabetic retinopathy of left eye with macular edema associated with type 2  diabetes mellitus ([name]) 03/01/2008   Hyperlipidemia    Essential hypertension    History of kidney stones     ONSET DATE: December 2024 (hospitalized from falling)  REFERRING DIAG: G70.00 (ICD-10-CM) - Myasthenia gravis without (acute) exacerbation R29.898 (ICD-10-CM) - Other symptoms and signs involving the musculoskeletal system  THERAPY DIAG:  Muscle weakness (generalized)  Unsteadiness on feet  Difficulty in walking, not elsewhere classified  Other abnormalities of gait and mobility  Rationale for Evaluation and Treatment: Rehabilitation  SUBJECTIVE:                                                                                                                                                                                             SUBJECTIVE STATEMENT: Pt presents to pool, reporting doing well, missed this therapist last week.   Pt accompanied by: self  PERTINENT HISTORY: DM, HTN, HLD, sleep apnea, PAD, CAD s/p quintuple bypass, CKD, CHF, myasthenia gravis, Afib, dementia, Right hip hemiarthroplasty.  PAIN:  Are you having pain? No  PRECAUTIONS: Fall  RED FLAGS: None   WEIGHT BEARING RESTRICTIONS: No  FALLS: Has patient fallen in last 6 months? Yes. Number of falls 1  LIVING ENVIRONMENT: Lives with: lives with their spouse Lives in: House/apartment Stairs: Yes: Internal: has lift to 2nd floor steps; lift and External: 5 steps; lift to access Has following equipment at home: Walker - 4 wheeled, leaves one outside and one inside  PLOF: Independent with basic ADLs, Independent with household mobility with device, and uses seat for shower  PATIENT GOALS: improve balance, dizziness (when standing), SOB/DOE  OBJECTIVE:  Note: Objective measures were completed at Evaluation unless otherwise noted.  Vital signs: 97%, 80 bpm, 113/78 mmHg (seated); 97%, (standing) 112/75 mmHg, 94 bpm, 97% (From evaluation)  DIAGNOSTIC FINDINGS:   COGNITION: Overall cognitive  status: Within functional limits for tasks assessed   SENSATION: Reports numbness along right lower leg (  knee down)  COORDINATION: WFL, difficulty with heel to shin from proximal hip weakness  EDEMA:  none  MUSCLE TONE:  Appreciate some hypotonia throughout BLE, tib anterior most evident   DTRs:  NT  POSTURE: forward head  LOWER EXTREMITY ROM:     Active  Right Eval Left Eval  Hip flexion 95 100  Hip extension    Hip abduction    Hip adduction    Hip internal rotation    Hip external rotation    Knee flexion 120 120  Knee extension 0 0  Ankle dorsiflexion 10 10  Ankle plantarflexion    Ankle inversion    Ankle eversion     (Blank rows = not tested)  LOWER EXTREMITY MMT:  (seated resisted tests)  MMT Right Eval Left Eval  Hip flexion 3 3+  Hip extension    Hip abduction 2+/3- 3+  Hip adduction 3+ 3+  Hip internal rotation    Hip external rotation    Knee flexion 4 4  Knee extension 4- 4-  Ankle dorsiflexion 3 3  Ankle plantarflexion 2+ 2+  Ankle inversion    Ankle eversion    (Blank rows = not tested)  BED MOBILITY:  Reports independence  TRANSFERS: Assistive device utilized: Environmental consultant - 4 wheeled  Sit to stand: Complete Independence and Modified independence Stand to sit: Complete Independence and Modified independence Chair to chair: Complete Independence and Modified independence Floor: NT    CURB:  Level of Assistance: CGA and Min A Assistive device utilized: HR Curb Comments:   STAIRS: NT--reports they have stair lifts for external/internal stairs at home  GAIT: Gait pattern: w/ 4NW achieves and step through pattern--without AD ambulates with very short, shuffled steps and right retro-Trendelenberg Distance walked:  Assistive device utilized: Environmental consultant - 4 wheeled Level of assistance: Modified independence Comments: deficits in turning  FUNCTIONAL TESTS:  5 times sit to stand: 18.97 sec Berg Balance Scale: 40/56 :  TBD  M-CTSIB  Condition 1: Firm Surface, EO 30 Sec, Mild Sway  Condition 2: Firm Surface, EC 30 Sec, Mild and Moderate Sway  Condition 3: Foam Surface, EO 30 Sec, Mild Sway  Condition 4: Foam Surface, EC 30 Sec, Moderate and Severe Sway                                                                                                                                  TREATMENT DATE: 01/05/24  Patient seen for aquatic therapy today.  Treatment took place in water  3.6-4.0 feet deep depending upon activity.  Pt entered and exited the pool via stairs using B rails as needed for support.  Pool temp approx 92 deg.   Warm up: Walking forwards x approx 18' x 4 reps and backwards walking x 4 reps  and side stepping x 4 laps with yellow noodle (less support) for support and S from PT.    Marching forward holding yellow noodle for  support as above x 2 laps with cues for keeping trunk upright (no extension) and moving slowly to work on modified SLS.  Sit<>stand from pool bench with small step under feet without UE support (reaching forward to encourage forward weight shift) x 10 reps.    Sitting on pool bench performed seated bicycles fast x 3 rounds of 20 reps.  Cues for breathing throughout.  Continued with endurance training with resisted forward walking x 2 reps, resisted side stepping x 2 reps.  Forward jogging x 4 laps (HHA for support).    Standing with barbells first then no support, performed ant/lateral/post stepping x 5 rounds on each LE.  Pt did much better with this task today vs previous session.  Staggered stance ant/post weight shifts with barbells for support x 10 reps each side.  Pt continues to need cuing for shifting weight from hips and avoiding overt lumbar extension.    Forward step ups with opposite LE march x 10 reps on each side. Pt did much better today with much less trunk extension.  Lateral step ups/downs x 10 reps each direction with cues for speeding up motion for more endurance  training.  (Held wall for light support for these).         Ended with standing balance on foam noodle (with hole) with feet apart, intermittently letting go of wall x 3 sets of 20 secs.  Cues for improved hip extension. Tandem stance (not on noodle) x 3 reps of 15 secs each side.    PT to send HEP to primary PT to print at next session.           Access Code: [email or web address] URL: [email or web address] Date: 01/26/2024 Prepared by: Terri Fester  Exercises - Forward and Backward Stepping at [facility]  - 1 x daily - 7 x weekly - 1 sets - 4 reps - Side Stepping  - 1 x daily - 7 x weekly - 1 sets - 4 reps - Forward March with Hand Floats  - 1 x daily - 7 x weekly - 1 sets - 4 reps - Standing Hip Flexion Extension at [employer]  - 1 x daily - 7 x weekly - 1 sets - 10 reps - Standing Hip Circles at [employer]  - 1 x daily - 7 x weekly - 1 sets - 10 reps - Runner's Step Up/Down  - 1 x daily - 7 x weekly - 3 sets - 10 reps - Sit to Stand Without Arm Support  - 1 x daily - 7 x weekly - 1 sets - 10 reps - Seated Straddle on Flotation Forward Breast Stroke Arms and Bicycle Legs  - 1 x daily - 7 x weekly - 3 sets - 10 reps                            Pt requires buoyancy of water  for support for reduced fall risk and for unloading/reduced stress on joints as pt able to tolerate increased standing and ambulation in water  compared to that on land; viscosity of water  is needed for resistance for strengthening and current of water  provides perturbations for challenge for balance training       PATIENT EDUCATION: Education details: aquatic rationale  Person educated: Patient and Spouse Education method: Explanation, Facilities manager, and Handouts Education comprehension: needs further education  HOME EXERCISE PROGRAM: Access Code: [email or web address] URL: [email or web address] Date: 12/17/2023 Prepared by: Kelly Halpin  Exercises -  Sit to Stand with Resistance Around Legs  - 2-3 x daily  - 7 x weekly - 5 sets - 5 reps - Seated Ankle Dorsiflexion with Resistance  - 1 x daily - 7 x weekly - 3 sets - 10 reps - 3 sec hold  GOALS: Goals reviewed with patient? Yes  SHORT TERM GOALS: Target date: 01/08/2024    Independent with land-based interventions for HEP Baseline: Goal status: INITIAL  2.  Demo improved BLE strength as evident by 5xSTS test < 15 sec to improve mobility/reduce fall risk Baseline: 18 sec Goal status: INITIAL  3.  Manifest improved postural stability per mild sway condition 4 x 30 sec M-CTSIB for safety during ADL Baseline: mod-severe x 30 sec Goal status: INITIAL    LONG TERM GOALS: Target date: 02/11/2024    Demo independence with HEP that include land and aquatic-based activities for improved carryover/self-management at D/C Baseline:  Goal status: INITIAL  2.  Pt to teach-back relevant exercise/physical activity considerations for those with MG to enhance self-efficacy Baseline:  Goal status: INITIAL  3.  Demo low risk for falls per score 50/56 Berg Balance Test Baseline: 40/56 Goal status: INITIAL  4.  to be performed and goal developed Baseline: TBD Goal status: INITIAL  5.  Demo modified independent floor to sit recovery to improve mobility and safety in home Baseline:  Goal status: INITIAL   ASSESSMENT:  CLINICAL IMPRESSION: Pt overall making good progress with aquatic PT.  He went to the pool last week with wife and just did walking.  PT to send HEP to primary PT today so that he can have printed version from here on out.    OBJECTIVE IMPAIRMENTS: Abnormal gait, decreased activity tolerance, decreased balance, decreased endurance, decreased knowledge of condition, decreased mobility, difficulty walking, decreased strength, dizziness, impaired sensation, impaired tone, and improper body mechanics.   ACTIVITY LIMITATIONS: carrying, lifting, bending, standing, squatting, stairs, transfers, reach over head, and locomotion  level  PARTICIPATION LIMITATIONS: meal prep, cleaning, laundry, interpersonal relationship, shopping, community activity, and exercise routine  PERSONAL FACTORS: Age, Time since onset of injury/illness/exacerbation, and 3+ comorbidities: PMH and current conditions are also affecting patient's functional outcome.   REHAB POTENTIAL: Good  CLINICAL DECISION MAKING: Evolving/moderate complexity  EVALUATION COMPLEXITY: Moderate  PLAN:  PT FREQUENCY: 2x/week  PT DURATION: 6 weeks  PLANNED INTERVENTIONS: 97110-Therapeutic exercises, 97530- Therapeutic activity, 97112- Neuromuscular re-education, [phone]- Self Care, [id number]- Manual therapy, [phone]- Gait training, [phone]- Orthotic Fit/training, [phone]- Canalith repositioning, [id number]- Aquatic Therapy, [phone]- Electrical stimulation (unattended), Patient/Family education, Vestibular training, and DME instructions  PLAN FOR NEXT SESSION: Continue balance, endurance, stepping strategy   Terri Fester, PT, MPT [facility] [address] [place], [name], [address] Phone: [phone]   Fax:  [phone] 01/26/24, 9:20 AM

## 2024-01-28 ENCOUNTER — Ambulatory Visit

## 2024-01-28 DIAGNOSIS — R2689 Other abnormalities of gait and mobility: Secondary | ICD-10-CM

## 2024-01-28 DIAGNOSIS — R2681 Unsteadiness on feet: Secondary | ICD-10-CM

## 2024-01-28 DIAGNOSIS — M6281 Muscle weakness (generalized): Secondary | ICD-10-CM | POA: Diagnosis not present

## 2024-01-28 DIAGNOSIS — R262 Difficulty in walking, not elsewhere classified: Secondary | ICD-10-CM

## 2024-01-28 NOTE — Therapy (Signed)
 OUTPATIENT PHYSICAL THERAPY NEURO TREATMENT and D/C Summary   Patient Name: Fernando Hess MRN: [id number] DOB:10-Jun-1946, 78 y.o., male Today's Date: 01/28/2024   PCP: Jimmey Mould, MD REFERRING PROVIDER: Jimmey Mould, MD PHYSICAL THERAPY DISCHARGE SUMMARY  Visits from Start of Care: 11  Current functional level related to goals / functional outcomes: Improved functional status at end of care, see below for outcome measures   Remaining deficits: RLE weakness/Trendelenberg gait   Education / Equipment: HEP   Patient agrees to discharge. Patient goals were partially met. Patient is being discharged due to being pleased with the current functional level.  END OF SESSION:  PT End of Session - 01/28/24 0807     Visit Number 11    Number of Visits 13    Date for PT Re-Evaluation 02/11/24    Authorization Type [employer]    Authorization Time Period 13 visits from 4/3-5/15/25    Authorization - Visit Number 10    Authorization - Number of Visits 13    Progress Note Due on Visit 19    PT Start Time 0801    PT Stop Time 0845    PT Time Calculation (min) 44 min    Equipment Utilized During Treatment Other (comment)   floatation devices as needed for safety   Activity Tolerance Patient tolerated treatment well;Patient limited by fatigue    Behavior During Therapy WFL for tasks assessed/performed             Past Medical History:  Diagnosis Date   Acute renal failure (ARF) ([name]) 09/24/2015   Acute stress disorder 11/13/2021   Atrial premature complexes    CAD (coronary artery disease) of artery bypass graft    Early occlusion of saphenous vein graft to intermediate and marginal branch in February 2007 following bypass grafting    CAD (coronary artery disease), native coronary artery 2017   hx NSTEMI 09-24-2015  s/p  CABG x5 on 10-02-2015;  post op STEMI inferolateral wall,  SVG OM1 and SVG OM2 occluded, distal OM occlusion the calpruit, treated  medically // Myoview  7/21: no ischemia, EF 65, low risk   CHF (congestive heart failure) ([name])    CKD (chronic kidney disease), stage III ([name])    Contusion of right knee 03/16/2020   Dyspnea    Elevated troponin    Erectile dysfunction    Esophageal reflux    History of atrial fibrillation    post op CABG 10-02-2015   History of kidney stones    History of non-ST elevation myocardial infarction (NSTEMI) 09/24/2015   s/p  CABG x5   History of ST elevation myocardial infarction (STEMI) 10/22/2015   inferior wall,  post op CABG 10-02-2015   Hyperlipidemia    Hypertension    Left ureteral stone    Mild atherosclerosis of both carotid arteries    Nephrolithiasis    per CT bilateral non-obstructive calculi   OSA (obstructive sleep apnea)    Peripheral artery disease    LE Arterial US  01/2019: R PTA and ATA occluded; L ATA occluded   RBBB (right bundle branch block)    Renal atrophy, right    Sleep apnea    wears cpap    ST elevation myocardial infarction (STEMI) of inferior wall ([name]) 10/22/2015   Type 2 diabetes mellitus treated with insulin  ([name])    followed by pcp   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) 03/01/2008  Wears glasses    Past Surgical History:  Procedure Laterality Date   APPENDECTOMY  1965   CARDIAC CATHETERIZATION N/A 09/26/2015   Procedure: Left Heart Cath and Coronary Angiography;  Surgeon: Millicent Ally, MD;  Location: [facility] INVASIVE CV LAB;  Service: Cardiovascular;  Laterality: N/A;   CARDIAC CATHETERIZATION N/A 10/22/2015   Procedure: Left Heart Cath and Coronary Angiography;  Surgeon: Arnoldo Lapping, MD;  Location: [facility] INVASIVE CV LAB;  Service: Cardiovascular;  Laterality: N/A;   CATARACT EXTRACTION W/ INTRAOCULAR LENS  IMPLANT, BILATERAL  2017   COLONOSCOPY     CORONARY ARTERY BYPASS GRAFT N/A 10/02/2015   Procedure: CORONARY ARTERY BYPASS GRAFTING (CABG) X5 LIMA-LAD; SVG-DIAG;  SVG-OM; SVG-PD; SVG-RAMUS TRANSESOPHAGEAL ECHOCARDIOGRAM (TEE) ENDOSCOPIC GREATER SAPHENOUS VEIN  HARVEST BILAT LE;  Surgeon: Heriberto London, MD;  Location: [facility] OR;  Service: Open Heart Surgery;  Laterality: N/A;   CYSTOSCOPY/URETEROSCOPY/HOLMIUM LASER/STENT PLACEMENT Left 08/10/2018   Procedure: CYSTOSCOPY/URETEROSCOPY/HOLMIUM LASER/STENT PLACEMENT;  Surgeon: Christina Coyer, MD;  Location: [facility];  Service: Urology;  Laterality: Left;   CYSTOSCOPY/URETEROSCOPY/HOLMIUM LASER/STENT PLACEMENT Left 09/10/2018   Procedure: CYSTOSCOPY/URETEROSCOPY/HOLMIUM LASER/STENT EXCHANGE;  Surgeon: Christina Coyer, MD;  Location: [facility] ORS;  Service: Urology;  Laterality: Left;   HIP ARTHROPLASTY Right 12/26/2022   Procedure: RIGHT HEMI HIP ARTHROPLASTY;  Surgeon: Murleen Arms, MD;  Location: [facility] OR;  Service: Orthopedics;  Laterality: Right;   LEFT HEART CATHETERIZATION WITH CORONARY ANGIOGRAM N/A 04/13/2014   Procedure: LEFT HEART CATHETERIZATION WITH CORONARY ANGIOGRAM;  Surgeon: Dorsey Gault, MD;  Location: [facility] CATH LAB;  Service: Cardiovascular;  Laterality: N/A;   LEG SURGERY Right age 78   closed reduction leg fracture   NASAL SEPTOPLASTY W/ TURBINOPLASTY Bilateral 08/30/2021   Procedure: NASAL SEPTOPLASTY WITH BILATERAL INFERIOR TURBINATE REDUCTION;  Surgeon: Ammon Bales, MD;  Location: [facility] OR;  Service: ENT;  Laterality: Bilateral;   POLYPECTOMY     RIGHT HEART CATH N/A 06/06/2021   Procedure: RIGHT HEART CATH;  Surgeon: Mardell Shade, MD;  Location: [facility] INVASIVE CV LAB;  Service: Cardiovascular;  Laterality: N/A;   TEE WITHOUT CARDIOVERSION N/A 10/02/2015   Procedure: TRANSESOPHAGEAL ECHOCARDIOGRAM (TEE);  Surgeon: Heriberto London, MD;  Location: [facility] OR;  Service: Open Heart Surgery;  Laterality: N/A;   URETEROSCOPY WITH HOLMIUM LASER LITHOTRIPSY Bilateral 2004;  2005  dr grapey  @[facility]    VASECTOMY     Patient Active Problem List   Diagnosis Date Noted   SOB  (shortness of breath) 09/15/2023   BPH (benign prostatic hyperplasia) 02/27/2023   History of anemia due to chronic kidney disease 02/27/2023   COVID-19 02/27/2023   COVID-19 virus infection 02/26/2023   Acute kidney injury superimposed on chronic kidney disease ([name]) 02/03/2023   Transient hypotension 02/03/2023   SIRS (systemic inflammatory response syndrome) ([name]) 02/03/2023   Dementia without behavioral disturbance ([name]) 02/03/2023   Fall at home, initial encounter 02/03/2023   Acute respiratory failure with hypoxia ([name]) 02/03/2023   Hypokalemia 02/03/2023   Prolonged QT interval 02/03/2023   Left ankle pain 01/08/2023   Hematuria 01/08/2023   Fracture of femoral neck, right, closed ([name]) 12/24/2022   Depression with anxiety 12/24/2022   Acute metabolic encephalopathy 09/18/2022   Acute encephalopathy 09/17/2022   AKI (acute kidney injury) ([name]) 09/04/2022   Generalized weakness 08/12/2022   Myasthenia gravis ([name]) 04/21/2022   CKD stage 3b, GFR 30-44 ml/min ([name]) 03/21/2022   Posterior vitreous detachment of both eyes 01/23/2022   Constipation 11/13/2021   Controlled type 2  diabetes mellitus without complication, without long-term current use of insulin  ([name]) 11/13/2021   Severe major depression, single episode, without psychotic features ([name]) 11/13/2021   Amaurosis fugax 11/13/2021   Amnesia 11/13/2021   Anxiety 11/13/2021   Cardiac arrhythmia 11/13/2021   Hearing loss 11/13/2021   Hypercoagulable state ([name]) 11/13/2021   Hyperparathyroidism due to renal insufficiency ([name]) 11/13/2021   Moderate aortic stenosis 11/05/2021   Paroxysmal atrial fibrillation ([name]) 11/05/2021   Nasal septal deviation 08/30/2021   Anticoagulated by anticoagulation treatment 07/16/2021   Syncope 05/10/2021   Gastro-esophageal reflux disease without esophagitis 07/25/2020   Globus pharyngeus 07/25/2020   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)  12/21/2019   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   (HFpEF) heart failure with preserved ejection fraction ([name]) 11/18/2019   OSA on CPAP 03/07/2019   Peripheral artery disease    Intractable vascular headache 11/11/2018   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   Coronary artery disease involving native coronary artery of native heart without angina pectoris 09/24/2015   Moderate nonproliferative diabetic retinopathy of left eye with macular edema associated with type 2 diabetes mellitus ([name]) 03/01/2008   Hyperlipidemia    Essential hypertension    History of kidney stones     ONSET DATE: December 2024 (hospitalized from falling)  REFERRING DIAG: G70.00 (ICD-10-CM) - Myasthenia gravis without (acute) exacerbation R29.898 (ICD-10-CM) - Other symptoms and signs involving the musculoskeletal system  THERAPY DIAG:  Muscle weakness (generalized)  Unsteadiness on feet  Difficulty in walking, not elsewhere classified  Other abnormalities of gait and mobility  Rationale for Evaluation and Treatment: Rehabilitation  SUBJECTIVE:                                                                                                                                                                                             SUBJECTIVE STATEMENT: Has joined the [facility] for continued activities  Pt accompanied by: self  PERTINENT HISTORY: DM, HTN, HLD, sleep apnea, PAD, CAD s/p quintuple bypass, CKD, CHF, myasthenia gravis, Afib, dementia, Right hip hemiarthroplasty.  PAIN:  Are you having pain? No  PRECAUTIONS: Fall  RED FLAGS: None   WEIGHT BEARING RESTRICTIONS: No  FALLS: Has patient fallen in last 6 months? Yes. Number of falls 1  LIVING ENVIRONMENT: Lives with: lives with their spouse Lives in: House/apartment Stairs: Yes: Internal: has lift to 2nd floor steps; lift and External: 5 steps; lift to access Has following equipment at home: Otho Blitz - 4 wheeled, leaves one outside and one  inside  PLOF: Independent with basic ADLs, Independent with household mobility with device, and uses  seat for shower  PATIENT GOALS: improve balance, dizziness (when standing), SOB/DOE  OBJECTIVE:   TODAY'S TREATMENT: 01/28/24 Activity Comments  POC review, see below                     [facility] PT Assessment - 01/28/24 0001       6 Minute Walk- Baseline   6 Minute Walk- Baseline yes    BP (mmHg) 132/80    HR (bpm) 83    02 Sat (%RA) 96 %    Modified Borg Scale for Dyspnea 6-      6 Minute walk- Post Test   6 Minute Walk Post Test yes    BP (mmHg) 128/83    HR (bpm) 87    02 Sat (%RA) 97 %    Modified Borg Scale for Dyspnea 8-      6 minute walk test results    Aerobic Endurance Distance Walked 765      Standardized Balance Assessment   Standardized Balance Assessment Five Times Sit to Stand    Five times sit to stand comments  16 sec      Berg Balance Test   Sit to Stand Able to stand without using hands and stabilize independently    Standing Unsupported Able to stand safely 2 minutes    Sitting with Back Unsupported but Feet Supported on Floor or Stool Able to sit safely and securely 2 minutes    Stand to Sit Sits safely with minimal use of hands    Transfers Able to transfer safely, minor use of hands    Standing Unsupported with Eyes Closed Able to stand 10 seconds safely    Standing Unsupported with Feet Together Able to place feet together independently and stand 1 minute safely    From Standing, Reach Forward with Outstretched Arm Can reach confidently >25 cm (10")    From Standing Position, Pick up Object from Floor Able to pick up shoe safely and easily    From Standing Position, Turn to Look Behind Over each Shoulder Looks behind from both sides and weight shifts well    Turn 360 Degrees Able to turn 360 degrees safely but slowly    Standing Unsupported, Alternately Place Feet on Step/Stool Able to complete 4 steps without aid or supervision    Standing  Unsupported, One Foot in Front Able to take small step independently and hold 30 seconds    Standing on One Leg Tries to lift leg/unable to hold 3 seconds but remains standing independently    Total Score 47             Note: Objective measures were completed at Evaluation unless otherwise noted.  Vital signs: 97%, 80 bpm, 113/78 mmHg (seated); 97%, (standing) 112/75 mmHg, 94 bpm, 97% (From evaluation)  DIAGNOSTIC FINDINGS:   COGNITION: Overall cognitive status: Within functional limits for tasks assessed   SENSATION: Reports numbness along right lower leg (knee down)  COORDINATION: WFL, difficulty with heel to shin from proximal hip weakness  EDEMA:  none  MUSCLE TONE:  Appreciate some hypotonia throughout BLE, tib anterior most evident   DTRs:  NT  POSTURE: forward head  LOWER EXTREMITY ROM:     Active  Right Eval Left Eval  Hip flexion 95 100  Hip extension    Hip abduction    Hip adduction    Hip internal rotation    Hip external rotation    Knee flexion 120 120  Knee extension 0  0  Ankle dorsiflexion 10 10  Ankle plantarflexion    Ankle inversion    Ankle eversion     (Blank rows = not tested)  LOWER EXTREMITY MMT:  (seated resisted tests)  MMT Right Eval Left Eval  Hip flexion 3 3+  Hip extension    Hip abduction 2+/3- 3+  Hip adduction 3+ 3+  Hip internal rotation    Hip external rotation    Knee flexion 4 4  Knee extension 4- 4-  Ankle dorsiflexion 3 3  Ankle plantarflexion 2+ 2+  Ankle inversion    Ankle eversion    (Blank rows = not tested)  BED MOBILITY:  Reports independence  TRANSFERS: Assistive device utilized: Environmental consultant - 4 wheeled  Sit to stand: Complete Independence and Modified independence Stand to sit: Complete Independence and Modified independence Chair to chair: Complete Independence and Modified independence Floor: NT    CURB:  Level of Assistance: CGA and Min A Assistive device utilized: HR Curb Comments:    STAIRS: NT--reports they have stair lifts for external/internal stairs at home  GAIT: Gait pattern: w/ 8MV achieves and step through pattern--without AD ambulates with very short, shuffled steps and right retro-Trendelenberg Distance walked:  Assistive device utilized: Environmental consultant - 4 wheeled Level of assistance: Modified independence Comments: deficits in turning  FUNCTIONAL TESTS:  5 times sit to stand: 18.97 sec Berg Balance Scale: 40/56 : TBD  M-CTSIB  Condition 1: Firm Surface, EO 30 Sec, Mild Sway  Condition 2: Firm Surface, EC 30 Sec, Mild and Moderate Sway  Condition 3: Foam Surface, EO 30 Sec, Mild Sway  Condition 4: Foam Surface, EC 30 Sec, Moderate and Severe Sway                                                                                                                                  TREATMENT DATE: 01/05/24  Patient seen for aquatic therapy today.  Treatment took place in water  3.6-4.0 feet deep depending upon activity.  Pt entered and exited the pool via stairs using B rails as needed for support.  Pool temp approx 92 deg.   Warm up: Walking forwards x approx 18' x 4 reps and backwards walking x 4 reps  and side stepping x 4 laps with yellow noodle (less support) for support and S from PT.    Marching forward holding yellow noodle for support as above x 2 laps with cues for keeping trunk upright (no extension) and moving slowly to work on modified SLS.  Sit<>stand from pool bench with small step under feet without UE support (reaching forward to encourage forward weight shift) x 10 reps.    Sitting on pool bench performed seated bicycles fast x 3 rounds of 20 reps.  Cues for breathing throughout.  Continued with endurance training with resisted forward walking x 2 reps, resisted side stepping x 2 reps.  Forward jogging x 4 laps (HHA for support).  Standing with barbells first then no support, performed ant/lateral/post stepping x 5 rounds on each LE.  Pt did  much better with this task today vs previous session.  Staggered stance ant/post weight shifts with barbells for support x 10 reps each side.  Pt continues to need cuing for shifting weight from hips and avoiding overt lumbar extension.    Forward step ups with opposite LE march x 10 reps on each side. Pt did much better today with much less trunk extension.  Lateral step ups/downs x 10 reps each direction with cues for speeding up motion for more endurance training.  (Held wall for light support for these).         Ended with standing balance on foam noodle (with hole) with feet apart, intermittently letting go of wall x 3 sets of 20 secs.  Cues for improved hip extension. Tandem stance (not on noodle) x 3 reps of 15 secs each side.    PT to send HEP to primary PT to print at next session.           Access Code: [email or web address] URL: [email or web address] Date: 01/26/2024 Prepared by: Terri Fester  Exercises - Forward and Backward Stepping at [facility]  - 1 x daily - 7 x weekly - 1 sets - 4 reps - Side Stepping  - 1 x daily - 7 x weekly - 1 sets - 4 reps - Forward March with Hand Floats  - 1 x daily - 7 x weekly - 1 sets - 4 reps - Standing Hip Flexion Extension at [employer]  - 1 x daily - 7 x weekly - 1 sets - 10 reps - Standing Hip Circles at [employer]  - 1 x daily - 7 x weekly - 1 sets - 10 reps - Runner's Step Up/Down  - 1 x daily - 7 x weekly - 3 sets - 10 reps - Sit to Stand Without Arm Support  - 1 x daily - 7 x weekly - 1 sets - 10 reps - Seated Straddle on Flotation Forward Breast Stroke Arms and Bicycle Legs  - 1 x daily - 7 x weekly - 3 sets - 10 reps                            Pt requires buoyancy of water  for support for reduced fall risk and for unloading/reduced stress on joints as pt able to tolerate increased standing and ambulation in water  compared to that on land; viscosity of water  is needed for resistance for strengthening and current of water  provides  perturbations for challenge for balance training       PATIENT EDUCATION: Education details: aquatic rationale  Person educated: Patient and Spouse Education method: Explanation, Facilities manager, and Handouts Education comprehension: needs further education  HOME EXERCISE PROGRAM: Access Code: [email or web address] URL: [email or web address] Date: 12/17/2023 Prepared by: Tedd Favorite  Exercises - Sit to Stand with Resistance Around Legs  - 2-3 x daily - 7 x weekly - 5 sets - 5 reps - Seated Ankle Dorsiflexion with Resistance  - 1 x daily - 7 x weekly - 3 sets - 10 reps - 3 sec hold  GOALS: Goals reviewed with patient? Yes  SHORT TERM GOALS: Target date: 01/08/2024    Independent with land-based interventions for HEP Baseline: reports limited compliance w/ land-based Goal status: PARTIALLY MET  2.  Demo improved BLE strength as evident by 5xSTS test < 15 sec to  improve mobility/reduce fall risk Baseline: 18 sec; (01/28/24) 16 sec minimal UE use Goal status: NOT MET  3.  Manifest improved postural stability per mild sway condition 4 x 30 sec M-CTSIB for safety during ADL Baseline: mod-severe x 30 sec; mild x 30 sec Goal status: MET    LONG TERM GOALS: Target date: 02/11/2024    Demo independence with HEP that include land and aquatic-based activities for improved carryover/self-management at D/C Baseline: reports more participation with aquatic, land-based sporadic Goal status: PARTIALLY MET  2.  Pt to teach-back relevant exercise/physical activity considerations for those with MG to enhance self-efficacy Baseline: provided with literature and provides teach-back Goal status: MET  3.  Demo low risk for falls per score 50/56 Berg Balance Test Baseline: 40/56; (01/28/24) 47/56 Goal status: NOT MET  4.  to be performed and goal developed Baseline: 495 ft; (01/28/24) 765 ft Goal status: MET  5.  Demo modified independent floor to sit recovery to improve mobility  and safety in home Baseline: use of UE support on seat surfaces Goal status: MET   ASSESSMENT:  CLINICAL IMPRESSION: Demonstrates improved functional status and performance with low risk for falls by improved score from 40 to 47/56 Berg Balance Test. Significant improvement in gait speed and tolerance per performance from initial 495 ft to 765 ft. Improved BLE strength per 5xSTS test from baseline and ability to negotiate 360 degree turns in a more timely manner and without same degree of right Trendelenberg.  Pt reports recent membership to [facility] to continue swimming HEP and neighborhood walks to continue using [employer].  Pt pleased with current status and will D/C to HEP at this time.   OBJECTIVE IMPAIRMENTS: Abnormal gait, decreased activity tolerance, decreased balance, decreased endurance, decreased knowledge of condition, decreased mobility, difficulty walking, decreased strength, dizziness, impaired sensation, impaired tone, and improper body mechanics.   ACTIVITY LIMITATIONS: carrying, lifting, bending, standing, squatting, stairs, transfers, reach over head, and locomotion level  PARTICIPATION LIMITATIONS: meal prep, cleaning, laundry, interpersonal relationship, shopping, community activity, and exercise routine  PERSONAL FACTORS: Age, Time since onset of injury/illness/exacerbation, and 3+ comorbidities: PMH and current conditions are also affecting patient's functional outcome.   REHAB POTENTIAL: Good  CLINICAL DECISION MAKING: Evolving/moderate complexity  EVALUATION COMPLEXITY: Moderate  PLAN:  PT FREQUENCY: 2x/week  PT DURATION: 6 weeks  PLANNED INTERVENTIONS: 97110-Therapeutic exercises, 97530- Therapeutic activity, [id number]- Neuromuscular re-education, 97535- Self Care, [id number]- Manual therapy, [phone]- Gait training, [phone]- Orthotic Fit/training, [phone]- Canalith repositioning, [id number]- Aquatic Therapy, [phone]- Electrical stimulation (unattended), Patient/Family education,  Vestibular training, and DME instructions  PLAN FOR NEXT SESSION: D/C to HEP   9:54 AM, 01/28/24 M. Kelly Ronon Ferger, PT, DPT Physical Therapist- [place] Office Number: [phone]

## 2024-01-31 NOTE — Progress Notes (Signed)
 Cardiology Office Note:    Date:  02/01/2024  ID:  [name], DOB [birth year], MRN [id number] PCP: Jimmey Mould, MD  [facility] Providers Cardiologist:  Sonny Dust, MD (Inactive) Cardiology APP:  Gabino Joe, [name]  Electrophysiologist:  Boyce Byes, MD       Patient Profile:      Coronary artery disease S/p MI in 09/2015 >> s/p CABG S/p inferolateral STEMI 2/17 >> S-OM1 and S-OM2 occluded; dOM occluded >> med Rx [facility] 10/22/15: L-LAD patent, S-RPDA patent, S-D1 patent Myoview  10/2018: no isch, EF 52, apical thinning  Myoview  7/21: low risk  Myoview  12/22: low risk  Heart failure with preserved ejection fraction  admx in 11/2019 TTE 05/10/21: EF 60-65, mild LVH, GR 1 DD, mildly reduced RVSF, mild LAE, trivial MR, moderate AV calcification, mild AS (mean 11.7, V-max 250 cm/s)  RHC 06/06/21: RA = 2, RV = 32/5, PA = 31/11 (19), PCW = 4, Fick cardiac output/index = 4.1/2.1, Low filling pressures with moderately reduce CO  CPET 11/20/21: no cardiopulmonary limitation - Dyspnea due to deconditioning  TTE 12/30/22: EF 60-65, mod LVH, Gr 1 DD, mild MR< mild AI, mild AS (mean 11 mmHg) TTE 09/12/23: EF 60-65, no RWMA, mild LVH, NL RVSF, mod LAE, trivial MR, mild to mod AS (mean 9 mmHg, Vmax 205 cm/s, DI 0.42) Paroxysmal atrial fibrillation CHA2DS2-VASc Score = 6 >> Apixaban    Tachy-brady syndrome (eval by Dr. Marven Slimmer with EP in 11/21-patient declined ILR)   Mild to mod AS (TTE 08/2023: mean 9 mmHg, Vmax 205 cm/s, DI 0.42) Diabetes mellitus Hypertension Hyperlipidemia Chronic kidney disease stage IV Obstructive sleep apnea Carotid US  06/21/20: bilat ICA 1-39  Peripheral arterial disease eval by Dr. Katheryne Pane 7.2020; bilat tibial disease >> Med Rx (Pletal ) >> DC'd with CHF Dementia   Myasthenia gravis            Discussed the use of AI scribe software for clinical note transcription with the patient, who gave verbal consent to proceed.  History of Present  Illness Fernando Hess is a 78 y.o. male who returns for follow-up of CAD, CHF, A-fib, AS.  He was last seen by Dr. Ardell Beauvais in November 2023.  He had an admission in December 2024 that was overall felt to be related to pneumonia.  Echocardiogram at that time demonstrated EF 60-65, mild to moderate aortic stenosis with mean gradient 9 and Vmax 205 cm/s.  He does have a history of atrial fibrillation with possible tachybradycardia syndrome.  He was evaluated by Dr. Marven Slimmer with EP in September 2021 after an episode of syncope.  The patient had short lasting postconversion pauses of only 3 seconds.  Follow-up monitoring with an ILR was offered but the patient declined.  He is here with his wife.  He was previously on atorvastatin  for hyperlipidemia, but it is unclear if it was discontinued due to his myasthenia gravis. He experiences shortness of breath, particularly at night, but denies any chest discomfort or palpitations.  He has had chronic dyspnea for a long time that has been worked up extensively.  He uses a walker for mobility and has been in and out of rehabilitation facilities, with the last stay being in December of the previous year. He reports swelling in his legs and has gained 12 pounds since April 1st. He has chronic kidney disease and is followed by a nephrologist, Dr. Ansel Kingdom. He has not experienced any recent episodes of syncope or palpitations. He does  not have a home blood pressure monitor and takes hydralazine  as needed when his blood pressure is high during doctor visits. He has been experiencing dental issues, having chipped two teeth and requiring four fillings.    ROS-See HPI       Studies Reviewed:   EKG Interpretation Date/Time:  Monday Feb 01 2024 15:22:26 EDT Ventricular Rate:  78 PR Interval:  224 QRS Duration:  82 QT Interval:  412 QTC Calculation: 469 R Axis:   12  Text Interpretation: Sinus rhythm with marked sinus arrhythmia with 1st degree A-V block Confirmed  by Marlyse Single [phone]) on 02/01/2024 3:24:06 PM    Results    Risk Assessment/Calculations:    CHA2DS2-VASc Score = 6   This indicates a 9.7% annual risk of stroke. The patient's score is based upon: CHF History: 1 HTN History: 1 Diabetes History: 1 Stroke History: 0 Vascular Disease History: 1 Age Score: 2 Gender Score: 0    HYPERTENSION CONTROL Vitals:   02/01/24 1434 02/01/24 1753  BP: (!) 140/78 (!) 140/78    The patient's blood pressure is elevated above target today.  In order to address the patient's elevated BP: Blood pressure will be monitored at home to determine if medication changes need to be made.          Physical Exam:   VS:  BP (!) 140/78   Pulse 88   Ht 5\' 6"  (1.676 m)   Wt 201 lb (91.2 kg)   SpO2 95%   BMI 32.44 kg/m    Wt Readings from Last 3 Encounters:  02/01/24 201 lb (91.2 kg)  12/15/23 188 lb (85.3 kg)  09/28/23 182 lb (82.6 kg)    Constitutional:      Appearance: Healthy appearance. Not in distress.  Neck:     Vascular: No JVR. JVD normal.  Pulmonary:     Breath sounds: Normal breath sounds. No wheezing. No rales.  Cardiovascular:     Normal rate. Regular rhythm.     Murmurs: There is a grade 2/6 crescendo-decrescendo systolic murmur at the URSB.  Edema:    Peripheral edema present.    Pretibial: bilateral 1+ edema of the pretibial area. Abdominal:     Palpations: Abdomen is soft.        Assessment and Plan:   Assessment & Plan Coronary artery disease involving native coronary artery of native heart without angina pectoris History of myocardial infarction in 2017 followed by CABG.  A month after his surgery, he had an inferolateral STEMI in the setting of occlusion of the vein graft to the OM1 and vein graft to OM 2.  His LIMA-LAD, vein graft to RPDA and vein graft to the D1 were all patent.  He was managed medically.  His last nuclear stress test was in December 2022 and was low risk.  He is not having chest symptoms to  suggest angina.  He is not on antiplatelet therapy as he is on Eliquis .  He is no longer on statin therapy for unclear reasons.  He has follow-up with Dr. Maximo Spar in July. Chronic heart failure with preserved ejection fraction ([name]) EF 60-65 by echocardiogram December 2024.  He has chronic dyspnea as noted.  He underwent CPET in 2023 that demonstrated no cardiopulmonary limitation and his dyspnea is mostly felt to be due to deconditioning.  Prior to that he had a right heart catheterization that demonstrated low filling pressures in 2022.  His weight has increased since April.  He does have lower  extremity edema on exam.  However, his neck veins are flat and his lungs are clear.  I have recommended that he take furosemide  today and again tomorrow if his edema persists. Moderate aortic stenosis Echocardiogram in December 2024 with mild to moderate aortic stenosis with a mean gradient of 9 mmHg and a V-max of 205 cm/s.  I will arrange follow-up echocardiogram in December 2025. Paroxysmal atrial fibrillation [facility]) He had atrial fibrillation noted during hospitalization in December 2024.  This was in the setting of pneumonia.  Repeat EKG today demonstrates sinus rhythm.  Continue Eliquis  5 mg twice daily. Hyperlipidemia LDL goal <70 As noted, he is no longer on statin therapy for unclear reasons.  Question if this was stopped in the past secondary to his history of myasthenia gravis.  I will reach out to his neurologist to ensure that it is okay to place him back on statin therapy or to discuss alternatives. CKD (chronic kidney disease) stage 4, GFR 15-29 ml/min ([name]) He is followed by nephrology.  Most recent creatinine was 2.05 in December 2024.  At his follow-up in July, he will be 78 years old.  If his creatinine remains above 1.5 we will need to decrease his Eliquis  to 2.5 mg twice daily. Essential hypertension Blood pressure borderline elevated today.  He only has hydralazine  on his medication list to  take as needed.  As noted, I have asked him to take Lasix  for the next couple days.  If his blood pressure continues to remain above target, we could consider placing him on hydralazine  on a daily basis.      Dispo:  Return in 2 months (on 04/08/2024) for Scheduled Follow Up w/ Dr. Maximo Spar.  Signed, Marlyse Single, [name]

## 2024-02-01 ENCOUNTER — Encounter: Payer: Self-pay | Admitting: Physician Assistant

## 2024-02-01 ENCOUNTER — Ambulatory Visit: Attending: Physician Assistant | Admitting: Physician Assistant

## 2024-02-01 VITALS — BP 140/78 | HR 88 | Ht 66.0 in | Wt 201.0 lb

## 2024-02-01 DIAGNOSIS — I10 Essential (primary) hypertension: Secondary | ICD-10-CM

## 2024-02-01 DIAGNOSIS — E785 Hyperlipidemia, unspecified: Secondary | ICD-10-CM

## 2024-02-01 DIAGNOSIS — I350 Nonrheumatic aortic (valve) stenosis: Secondary | ICD-10-CM

## 2024-02-01 DIAGNOSIS — I480 Paroxysmal atrial fibrillation: Secondary | ICD-10-CM

## 2024-02-01 DIAGNOSIS — I2510 Atherosclerotic heart disease of native coronary artery without angina pectoris: Secondary | ICD-10-CM | POA: Diagnosis not present

## 2024-02-01 DIAGNOSIS — I5032 Chronic diastolic (congestive) heart failure: Secondary | ICD-10-CM | POA: Diagnosis not present

## 2024-02-01 DIAGNOSIS — N184 Chronic kidney disease, stage 4 (severe): Secondary | ICD-10-CM

## 2024-02-01 NOTE — Assessment & Plan Note (Signed)
 History of myocardial infarction in 2017 followed by CABG.  A month after his surgery, he had an inferolateral STEMI in the setting of occlusion of the vein graft to the OM1 and vein graft to OM 2.  His LIMA-LAD, vein graft to RPDA and vein graft to the D1 were all patent.  He was managed medically.  His last nuclear stress test was in December 2022 and was low risk.  He is not having chest symptoms to suggest angina.  He is not on antiplatelet therapy as he is on Eliquis .  He is no longer on statin therapy for unclear reasons.  He has follow-up with Dr. Maximo Spar in July.

## 2024-02-01 NOTE — Assessment & Plan Note (Signed)
 He had atrial fibrillation noted during hospitalization in December 2024.  This was in the setting of pneumonia.  Repeat EKG today demonstrates sinus rhythm.  Continue Eliquis  5 mg twice daily.

## 2024-02-01 NOTE — Assessment & Plan Note (Signed)
 EF 60-65 by echocardiogram December 2024.  He has chronic dyspnea as noted.  He underwent CPET in 2023 that demonstrated no cardiopulmonary limitation and his dyspnea is mostly felt to be due to deconditioning.  Prior to that he had a right heart catheterization that demonstrated low filling pressures in 2022.  His weight has increased since April.  He does have lower extremity edema on exam.  However, his neck veins are flat and his lungs are clear.  I have recommended that he take furosemide  today and again tomorrow if his edema persists.

## 2024-02-01 NOTE — Patient Instructions (Addendum)
 Medication Instructions:  Take furosemide  20 mg today. Repeat tomorrow if legs are still swollen. Call if swelling does not improve.  *If you need a refill on your cardiac medications before your next appointment, please call your pharmacy*  Lab Work: None today  If you have labs (blood work) drawn today and your tests are completely normal, you will receive your results only by: MyChart Message (if you have MyChart) OR A paper copy in the mail If you have any lab test that is abnormal or we need to change your treatment, we will call you to review the results.  Testing/Procedures: Echocardiogram in December 2025  Follow-Up: At [facility], you and your health needs are our priority.  As part of our continuing mission to provide you with exceptional heart care, our providers are all part of one team.  This team includes your primary Cardiologist (physician) and Advanced Practice Providers or APPs (Physician Assistants and Nurse Practitioners) who all work together to provide you with the care you need, when you need it.  Your next appointment:   July 25    Provider:   Dr. Maximo Spar    We recommend signing up for the patient portal called "MyChart".  Sign up information is provided on this After Visit Summary.  MyChart is used to connect with patients for Virtual Visits (Telemedicine).  Patients are able to view lab/test results, encounter notes, upcoming appointments, etc.  Non-urgent messages can be sent to your provider as well.   To learn more about what you can do with MyChart, go to [email or web address].   Other Instructions

## 2024-02-01 NOTE — Assessment & Plan Note (Signed)
 Blood pressure borderline elevated today.  He only has hydralazine  on his medication list to take as needed.  As noted, I have asked him to take Lasix  for the next couple days.  If his blood pressure continues to remain above target, we could consider placing him on hydralazine  on a daily basis.

## 2024-02-01 NOTE — Assessment & Plan Note (Signed)
 Echocardiogram in December 2024 with mild to moderate aortic stenosis with a mean gradient of 9 mmHg and a V-max of 205 cm/s.  I will arrange follow-up echocardiogram in December 2025.

## 2024-02-02 ENCOUNTER — Telehealth: Payer: Self-pay | Admitting: *Deleted

## 2024-02-02 DIAGNOSIS — E785 Hyperlipidemia, unspecified: Secondary | ICD-10-CM

## 2024-02-02 DIAGNOSIS — I2510 Atherosclerotic heart disease of native coronary artery without angina pectoris: Secondary | ICD-10-CM

## 2024-02-02 MED ORDER — ATORVASTATIN CALCIUM 40 MG PO TABS: 40.0000 mg | ORAL_TABLET | Freq: Every day | ORAL | 3 refills | Status: DC

## 2024-02-02 NOTE — Telephone Encounter (Signed)
-----   Message from Marlyse Single sent at 02/01/2024  5:53 PM EDT ----- Can you call his neurologist's office and have them ask his neurologist if he can take a statin for his cholesterol? I cannot tell if it was stopped b/c of his dx of myasthenia gravis or not. He sees neurology for myasthenia gravis. If he wants to call me on my cell phone that is ok. If he prefers the pt not take a statin, ask if Zetia or Praluent/Repatha is ok. He needs chol management b/c of his hx of CAD. Neurologist: Myrtie Atkinson at [facility] Phone number [phone] Thanks Geralyn Knee

## 2024-02-02 NOTE — Telephone Encounter (Signed)
 Call placed to pt's Neurologist office, spoke with Debbie.  She will find out regarding the medication below and call me back and let me know.

## 2024-02-02 NOTE — Telephone Encounter (Signed)
 Will start Atorvastatin  40 mg once daily Lipids, LFTs 3 mos Orders placed Marlyse Single, [name]    02/02/2024 5:17 PM

## 2024-02-02 NOTE — Telephone Encounter (Signed)
 Neurologists states there is no problem with pt taking a statin.

## 2024-02-09 ENCOUNTER — Emergency Department (HOSPITAL_COMMUNITY)

## 2024-02-09 ENCOUNTER — Other Ambulatory Visit: Payer: Self-pay

## 2024-02-09 ENCOUNTER — Inpatient Hospital Stay (HOSPITAL_COMMUNITY)
Admission: EM | Admit: 2024-02-09 | Discharge: 2024-02-13 | DRG: 291 | Disposition: A | Discharge status: Home / Self Care | Attending: Internal Medicine | Admitting: Internal Medicine

## 2024-02-09 DIAGNOSIS — F0390 Unspecified dementia without behavioral disturbance: Secondary | ICD-10-CM | POA: Diagnosis present

## 2024-02-09 DIAGNOSIS — E1122 Type 2 diabetes mellitus with diabetic chronic kidney disease: Secondary | ICD-10-CM | POA: Diagnosis present

## 2024-02-09 DIAGNOSIS — E66811 Obesity, class 1: Secondary | ICD-10-CM | POA: Diagnosis present

## 2024-02-09 DIAGNOSIS — Z7984 Long term (current) use of oral hypoglycemic drugs: Secondary | ICD-10-CM

## 2024-02-09 DIAGNOSIS — E1165 Type 2 diabetes mellitus with hyperglycemia: Secondary | ICD-10-CM | POA: Diagnosis present

## 2024-02-09 DIAGNOSIS — E871 Hypo-osmolality and hyponatremia: Secondary | ICD-10-CM | POA: Diagnosis present

## 2024-02-09 DIAGNOSIS — E876 Hypokalemia: Secondary | ICD-10-CM | POA: Diagnosis present

## 2024-02-09 DIAGNOSIS — Z833 Family history of diabetes mellitus: Secondary | ICD-10-CM

## 2024-02-09 DIAGNOSIS — I10 Essential (primary) hypertension: Secondary | ICD-10-CM | POA: Diagnosis present

## 2024-02-09 DIAGNOSIS — G4733 Obstructive sleep apnea (adult) (pediatric): Secondary | ICD-10-CM | POA: Diagnosis present

## 2024-02-09 DIAGNOSIS — Z96641 Presence of right artificial hip joint: Secondary | ICD-10-CM | POA: Diagnosis present

## 2024-02-09 DIAGNOSIS — Z7901 Long term (current) use of anticoagulants: Secondary | ICD-10-CM

## 2024-02-09 DIAGNOSIS — Z9841 Cataract extraction status, right eye: Secondary | ICD-10-CM

## 2024-02-09 DIAGNOSIS — N1832 Chronic kidney disease, stage 3b: Secondary | ICD-10-CM | POA: Diagnosis present

## 2024-02-09 DIAGNOSIS — E119 Type 2 diabetes mellitus without complications: Secondary | ICD-10-CM | POA: Insufficient documentation

## 2024-02-09 DIAGNOSIS — N261 Atrophy of kidney (terminal): Secondary | ICD-10-CM | POA: Diagnosis present

## 2024-02-09 DIAGNOSIS — Z87442 Personal history of urinary calculi: Secondary | ICD-10-CM

## 2024-02-09 DIAGNOSIS — Z881 Allergy status to other antibiotic agents status: Secondary | ICD-10-CM

## 2024-02-09 DIAGNOSIS — Z961 Presence of intraocular lens: Secondary | ICD-10-CM | POA: Diagnosis present

## 2024-02-09 DIAGNOSIS — R0600 Dyspnea, unspecified: Principal | ICD-10-CM

## 2024-02-09 DIAGNOSIS — K5900 Constipation, unspecified: Secondary | ICD-10-CM | POA: Diagnosis present

## 2024-02-09 DIAGNOSIS — N184 Chronic kidney disease, stage 4 (severe): Secondary | ICD-10-CM | POA: Diagnosis present

## 2024-02-09 DIAGNOSIS — I130 Hypertensive heart and chronic kidney disease with heart failure and stage 1 through stage 4 chronic kidney disease, or unspecified chronic kidney disease: Principal | ICD-10-CM | POA: Diagnosis present

## 2024-02-09 DIAGNOSIS — I252 Old myocardial infarction: Secondary | ICD-10-CM

## 2024-02-09 DIAGNOSIS — Z6830 Body mass index (BMI) 30.0-30.9, adult: Secondary | ICD-10-CM

## 2024-02-09 DIAGNOSIS — Z79899 Other long term (current) drug therapy: Secondary | ICD-10-CM

## 2024-02-09 DIAGNOSIS — E785 Hyperlipidemia, unspecified: Secondary | ICD-10-CM | POA: Diagnosis present

## 2024-02-09 DIAGNOSIS — N179 Acute kidney failure, unspecified: Secondary | ICD-10-CM | POA: Diagnosis present

## 2024-02-09 DIAGNOSIS — I160 Hypertensive urgency: Secondary | ICD-10-CM | POA: Diagnosis present

## 2024-02-09 DIAGNOSIS — I440 Atrioventricular block, first degree: Secondary | ICD-10-CM | POA: Diagnosis present

## 2024-02-09 DIAGNOSIS — Z794 Long term (current) use of insulin: Secondary | ICD-10-CM

## 2024-02-09 DIAGNOSIS — Z9852 Vasectomy status: Secondary | ICD-10-CM

## 2024-02-09 DIAGNOSIS — E1169 Type 2 diabetes mellitus with other specified complication: Secondary | ICD-10-CM | POA: Diagnosis present

## 2024-02-09 DIAGNOSIS — Z7951 Long term (current) use of inhaled steroids: Secondary | ICD-10-CM

## 2024-02-09 DIAGNOSIS — I2510 Atherosclerotic heart disease of native coronary artery without angina pectoris: Secondary | ICD-10-CM | POA: Diagnosis present

## 2024-02-09 DIAGNOSIS — Z8249 Family history of ischemic heart disease and other diseases of the circulatory system: Secondary | ICD-10-CM

## 2024-02-09 DIAGNOSIS — G7000 Myasthenia gravis without (acute) exacerbation: Secondary | ICD-10-CM | POA: Diagnosis present

## 2024-02-09 DIAGNOSIS — I5033 Acute on chronic diastolic (congestive) heart failure: Secondary | ICD-10-CM | POA: Diagnosis present

## 2024-02-09 DIAGNOSIS — E1151 Type 2 diabetes mellitus with diabetic peripheral angiopathy without gangrene: Secondary | ICD-10-CM | POA: Diagnosis present

## 2024-02-09 DIAGNOSIS — I480 Paroxysmal atrial fibrillation: Secondary | ICD-10-CM | POA: Diagnosis present

## 2024-02-09 DIAGNOSIS — Z888 Allergy status to other drugs, medicaments and biological substances status: Secondary | ICD-10-CM

## 2024-02-09 DIAGNOSIS — Z9842 Cataract extraction status, left eye: Secondary | ICD-10-CM

## 2024-02-09 DIAGNOSIS — I509 Heart failure, unspecified: Secondary | ICD-10-CM | POA: Diagnosis not present

## 2024-02-09 LAB — BASIC METABOLIC PANEL WITH GFR
Anion gap: 7 (ref 5–15)
BUN: 32 mg/dL — ABNORMAL HIGH (ref 8–23)
CO2: 23 mmol/L (ref 22–32)
Calcium: 8.7 mg/dL — ABNORMAL LOW (ref 8.9–10.3)
Chloride: 105 mmol/L (ref 98–111)
Creatinine, Ser: 2.03 mg/dL — ABNORMAL HIGH (ref 0.61–1.24)
GFR, Estimated: 33 mL/min — ABNORMAL LOW (ref >60)
Glucose, Bld: 279 mg/dL — ABNORMAL HIGH (ref 70–99)
Potassium: 4.8 mmol/L (ref 3.5–5.1)
Sodium: 135 mmol/L (ref 135–145)

## 2024-02-09 LAB — CBC
HCT: 38.8 % — ABNORMAL LOW (ref 39.0–52.0)
Hemoglobin: 12.3 g/dL — ABNORMAL LOW (ref 13.0–17.0)
MCH: 27.7 pg (ref 26.0–34.0)
MCHC: 31.7 g/dL (ref 30.0–36.0)
MCV: 87.4 fL (ref 80.0–100.0)
Platelets: 225 10*3/uL (ref 150–400)
RBC: 4.44 MIL/uL (ref 4.22–5.81)
RDW: 14.2 % (ref 11.5–15.5)
WBC: 9.4 10*3/uL (ref 4.0–10.5)
nRBC: 0 % (ref 0.0–0.2)

## 2024-02-09 LAB — D-DIMER, QUANTITATIVE: D-Dimer, Quant: 0.4 ug{FEU}/mL (ref 0.00–0.50)

## 2024-02-09 LAB — TROPONIN I (HIGH SENSITIVITY): Troponin I (High Sensitivity): 10 ng/L (ref <18)

## 2024-02-09 NOTE — ED Triage Notes (Signed)
 Pt BIB [facility] from home. Pt c/o Sob/cp that got worse this evening. Pt has hx of afib and   Myasthenia gravis; Pt reports being compliant with all meds. Pt also reports running low fever the last few days

## 2024-02-09 NOTE — ED Notes (Signed)
 Please update wife (jean), number under emergency contacts

## 2024-02-10 ENCOUNTER — Inpatient Hospital Stay (HOSPITAL_COMMUNITY)

## 2024-02-10 DIAGNOSIS — E1169 Type 2 diabetes mellitus with other specified complication: Secondary | ICD-10-CM

## 2024-02-10 DIAGNOSIS — I350 Nonrheumatic aortic (valve) stenosis: Secondary | ICD-10-CM | POA: Diagnosis not present

## 2024-02-10 DIAGNOSIS — Z7901 Long term (current) use of anticoagulants: Secondary | ICD-10-CM | POA: Diagnosis not present

## 2024-02-10 DIAGNOSIS — Z794 Long term (current) use of insulin: Secondary | ICD-10-CM | POA: Diagnosis not present

## 2024-02-10 DIAGNOSIS — Z96641 Presence of right artificial hip joint: Secondary | ICD-10-CM | POA: Diagnosis present

## 2024-02-10 DIAGNOSIS — I5043 Acute on chronic combined systolic (congestive) and diastolic (congestive) heart failure: Secondary | ICD-10-CM | POA: Diagnosis not present

## 2024-02-10 DIAGNOSIS — N1832 Chronic kidney disease, stage 3b: Secondary | ICD-10-CM

## 2024-02-10 DIAGNOSIS — E1151 Type 2 diabetes mellitus with diabetic peripheral angiopathy without gangrene: Secondary | ICD-10-CM | POA: Diagnosis present

## 2024-02-10 DIAGNOSIS — I5033 Acute on chronic diastolic (congestive) heart failure: Secondary | ICD-10-CM

## 2024-02-10 DIAGNOSIS — G7000 Myasthenia gravis without (acute) exacerbation: Secondary | ICD-10-CM | POA: Diagnosis present

## 2024-02-10 DIAGNOSIS — I509 Heart failure, unspecified: Secondary | ICD-10-CM | POA: Diagnosis present

## 2024-02-10 DIAGNOSIS — N184 Chronic kidney disease, stage 4 (severe): Secondary | ICD-10-CM | POA: Diagnosis present

## 2024-02-10 DIAGNOSIS — E785 Hyperlipidemia, unspecified: Secondary | ICD-10-CM

## 2024-02-10 DIAGNOSIS — I10 Essential (primary) hypertension: Secondary | ICD-10-CM

## 2024-02-10 DIAGNOSIS — E66811 Obesity, class 1: Secondary | ICD-10-CM | POA: Diagnosis present

## 2024-02-10 DIAGNOSIS — Z6830 Body mass index (BMI) 30.0-30.9, adult: Secondary | ICD-10-CM | POA: Diagnosis not present

## 2024-02-10 DIAGNOSIS — G4733 Obstructive sleep apnea (adult) (pediatric): Secondary | ICD-10-CM | POA: Diagnosis present

## 2024-02-10 DIAGNOSIS — I2510 Atherosclerotic heart disease of native coronary artery without angina pectoris: Secondary | ICD-10-CM

## 2024-02-10 DIAGNOSIS — E1165 Type 2 diabetes mellitus with hyperglycemia: Secondary | ICD-10-CM | POA: Diagnosis present

## 2024-02-10 DIAGNOSIS — I440 Atrioventricular block, first degree: Secondary | ICD-10-CM | POA: Diagnosis present

## 2024-02-10 DIAGNOSIS — K5900 Constipation, unspecified: Secondary | ICD-10-CM | POA: Diagnosis present

## 2024-02-10 DIAGNOSIS — I160 Hypertensive urgency: Secondary | ICD-10-CM | POA: Diagnosis present

## 2024-02-10 DIAGNOSIS — E1122 Type 2 diabetes mellitus with diabetic chronic kidney disease: Secondary | ICD-10-CM | POA: Diagnosis present

## 2024-02-10 DIAGNOSIS — N179 Acute kidney failure, unspecified: Secondary | ICD-10-CM | POA: Diagnosis present

## 2024-02-10 DIAGNOSIS — I480 Paroxysmal atrial fibrillation: Secondary | ICD-10-CM | POA: Diagnosis present

## 2024-02-10 DIAGNOSIS — E871 Hypo-osmolality and hyponatremia: Secondary | ICD-10-CM | POA: Diagnosis present

## 2024-02-10 DIAGNOSIS — F0390 Unspecified dementia without behavioral disturbance: Secondary | ICD-10-CM | POA: Diagnosis present

## 2024-02-10 DIAGNOSIS — I130 Hypertensive heart and chronic kidney disease with heart failure and stage 1 through stage 4 chronic kidney disease, or unspecified chronic kidney disease: Secondary | ICD-10-CM | POA: Diagnosis present

## 2024-02-10 DIAGNOSIS — N261 Atrophy of kidney (terminal): Secondary | ICD-10-CM | POA: Diagnosis present

## 2024-02-10 DIAGNOSIS — Z961 Presence of intraocular lens: Secondary | ICD-10-CM | POA: Diagnosis present

## 2024-02-10 LAB — CBC
HCT: 38.9 % — ABNORMAL LOW (ref 39.0–52.0)
Hemoglobin: 12.5 g/dL — ABNORMAL LOW (ref 13.0–17.0)
MCH: 27.5 pg (ref 26.0–34.0)
MCHC: 32.1 g/dL (ref 30.0–36.0)
MCV: 85.7 fL (ref 80.0–100.0)
Platelets: 233 10*3/uL (ref 150–400)
RBC: 4.54 MIL/uL (ref 4.22–5.81)
RDW: 14.2 % (ref 11.5–15.5)
WBC: 10.9 10*3/uL — ABNORMAL HIGH (ref 4.0–10.5)
nRBC: 0 % (ref 0.0–0.2)

## 2024-02-10 LAB — HEMOGLOBIN A1C
Hgb A1c MFr Bld: 10.8 % — ABNORMAL HIGH (ref 4.8–5.6)
Mean Plasma Glucose: 263.26 mg/dL

## 2024-02-10 LAB — BASIC METABOLIC PANEL WITH GFR
Anion gap: 7 (ref 5–15)
BUN: 31 mg/dL — ABNORMAL HIGH (ref 8–23)
CO2: 22 mmol/L (ref 22–32)
Calcium: 8.5 mg/dL — ABNORMAL LOW (ref 8.9–10.3)
Chloride: 105 mmol/L (ref 98–111)
Creatinine, Ser: 2.08 mg/dL — ABNORMAL HIGH (ref 0.61–1.24)
GFR, Estimated: 32 mL/min — ABNORMAL LOW (ref >60)
Glucose, Bld: 345 mg/dL — ABNORMAL HIGH (ref 70–99)
Potassium: 4 mmol/L (ref 3.5–5.1)
Sodium: 134 mmol/L — ABNORMAL LOW (ref 135–145)

## 2024-02-10 LAB — BRAIN NATRIURETIC PEPTIDE: B Natriuretic Peptide: 142 pg/mL — ABNORMAL HIGH (ref 0.0–100.0)

## 2024-02-10 LAB — ECHOCARDIOGRAM COMPLETE
AR max vel: 1.27 cm2
AV Area VTI: 1.12 cm2
AV Area mean vel: 1.35 cm2
AV Mean grad: 9 mmHg
AV Peak grad: 18 mmHg
Ao pk vel: 2.12 m/s
Area-P 1/2: 3.42 cm2
Height: 66 in
S' Lateral: 2.6 cm
Weight: 3040 [oz_av]

## 2024-02-10 LAB — TROPONIN I (HIGH SENSITIVITY): Troponin I (High Sensitivity): 13 ng/L (ref <18)

## 2024-02-10 LAB — MAGNESIUM: Magnesium: 2 mg/dL (ref 1.7–2.4)

## 2024-02-10 LAB — CBG MONITORING, ED
Glucose-Capillary: 279 mg/dL — ABNORMAL HIGH (ref 70–99)
Glucose-Capillary: 274 mg/dL — ABNORMAL HIGH (ref 70–99)

## 2024-02-10 LAB — PHOSPHORUS: Phosphorus: 3 mg/dL (ref 2.5–4.6)

## 2024-02-10 MED ORDER — INSULIN GLARGINE-YFGN 100 UNIT/ML ~~LOC~~ SOLN
36.0000 [IU] | Freq: Every day | SUBCUTANEOUS | Status: DC
  Administered 2024-02-10 – 2024-02-13 (×4): 36 [IU] via SUBCUTANEOUS
  Filled 2024-02-10 (×4): qty 0.36

## 2024-02-10 MED ORDER — INSULIN ASPART 100 UNIT/ML IJ SOLN
0.0000 [IU] | Freq: Three times a day (TID) | INTRAMUSCULAR | Status: DC
  Administered 2024-02-10 (×2): 3 [IU] via SUBCUTANEOUS
  Administered 2024-02-10: 2 [IU] via SUBCUTANEOUS
  Administered 2024-02-11 (×2): 6 [IU] via SUBCUTANEOUS

## 2024-02-10 MED ORDER — QUETIAPINE FUMARATE 50 MG PO TABS
50.0000 mg | ORAL_TABLET | Freq: Every day | ORAL | Status: DC
  Administered 2024-02-10 – 2024-02-12 (×3): 50 mg via ORAL
  Filled 2024-02-10 (×3): qty 1

## 2024-02-10 MED ORDER — FUROSEMIDE 10 MG/ML IJ SOLN
40.0000 mg | Freq: Every day | INTRAMUSCULAR | Status: DC
  Administered 2024-02-10: 40 mg via INTRAVENOUS
  Filled 2024-02-10: qty 4

## 2024-02-10 MED ORDER — FUROSEMIDE 10 MG/ML IJ SOLN
20.0000 mg | Freq: Once | INTRAMUSCULAR | Status: AC
  Administered 2024-02-10: 20 mg via INTRAVENOUS
  Filled 2024-02-10: qty 2

## 2024-02-10 MED ORDER — PANTOPRAZOLE SODIUM 40 MG PO TBEC
40.0000 mg | DELAYED_RELEASE_TABLET | Freq: Every morning | ORAL | Status: DC
  Administered 2024-02-10 – 2024-02-13 (×4): 40 mg via ORAL
  Filled 2024-02-10 (×4): qty 1

## 2024-02-10 MED ORDER — APIXABAN 5 MG PO TABS
5.0000 mg | ORAL_TABLET | Freq: Two times a day (BID) | ORAL | Status: DC
  Administered 2024-02-10 – 2024-02-13 (×7): 5 mg via ORAL
  Filled 2024-02-10 (×7): qty 1

## 2024-02-10 MED ORDER — ATORVASTATIN CALCIUM 40 MG PO TABS: 40.0000 mg | ORAL_TABLET | Freq: Every day | ORAL | Status: DC

## 2024-02-10 MED ORDER — FUROSEMIDE 10 MG/ML IJ SOLN
60.0000 mg | Freq: Two times a day (BID) | INTRAMUSCULAR | Status: DC
  Administered 2024-02-10 – 2024-02-11 (×2): 60 mg via INTRAVENOUS
  Filled 2024-02-10 (×2): qty 6

## 2024-02-10 MED ORDER — FUROSEMIDE 10 MG/ML IJ SOLN
40.0000 mg | Freq: Once | INTRAMUSCULAR | Status: AC
  Administered 2024-02-10: 40 mg via INTRAVENOUS
  Filled 2024-02-10: qty 4

## 2024-02-10 MED ORDER — POLYVINYL ALCOHOL 1.4 % OP SOLN
1.0000 [drp] | OPHTHALMIC | Status: DC | PRN
  Administered 2024-02-10: 1 [drp] via OPHTHALMIC
  Filled 2024-02-10: qty 15

## 2024-02-10 MED ORDER — SODIUM CHLORIDE 0.9% FLUSH
3.0000 mL | Freq: Two times a day (BID) | INTRAVENOUS | Status: DC
  Administered 2024-02-10 – 2024-02-12 (×6): 3 mL via INTRAVENOUS

## 2024-02-10 MED ORDER — HYDRALAZINE HCL 25 MG PO TABS
25.0000 mg | ORAL_TABLET | Freq: Three times a day (TID) | ORAL | Status: DC
  Administered 2024-02-10 – 2024-02-13 (×9): 25 mg via ORAL
  Filled 2024-02-10 (×9): qty 1

## 2024-02-10 MED ORDER — TAMSULOSIN HCL 0.4 MG PO CAPS
0.4000 mg | ORAL_CAPSULE | Freq: Every day | ORAL | Status: DC
  Administered 2024-02-10 – 2024-02-12 (×3): 0.4 mg via ORAL
  Filled 2024-02-10 (×3): qty 1

## 2024-02-10 MED ORDER — LAMOTRIGINE 25 MG PO TABS
25.0000 mg | ORAL_TABLET | Freq: Two times a day (BID) | ORAL | Status: DC
  Administered 2024-02-10 – 2024-02-11 (×3): 25 mg via ORAL
  Filled 2024-02-10 (×3): qty 1

## 2024-02-10 MED ORDER — EMPAGLIFLOZIN 10 MG PO TABS
10.0000 mg | ORAL_TABLET | Freq: Every day | ORAL | Status: DC
  Administered 2024-02-10 – 2024-02-12 (×3): 10 mg via ORAL
  Filled 2024-02-10 (×3): qty 1

## 2024-02-10 NOTE — Plan of Care (Signed)
  Problem: Nutrition: Goal: Adequate nutrition will be maintained Outcome: Completed/Met

## 2024-02-10 NOTE — Assessment & Plan Note (Addendum)
 Echocardiogram with preserved LV systolic function, with moderate septal hypertrophy and severe hypertrophy of the mid inferior myocardium. LV EF 60 to 65%, RV systolic function preserved, LA with mild dilatation, RVSP 19.6 mmHg    Patient was placed on IV furosemide  for diuresis, negative fluid balance was achieved, - 4,380 ml, with significant improvement in his symptoms.    Plan to continue diuresis at home with furosemide .  Limited medical therapy due to acutely reduced GFR.

## 2024-02-10 NOTE — Assessment & Plan Note (Addendum)
 Telemetry with sinus arrhythmia with PAC. Rate controlled at 80 bpm.  Continue anticoagulation with apixaban .

## 2024-02-10 NOTE — Progress Notes (Signed)
 Progress Note   Patient: Fernando Hess ZOX:[id number] DOB: [birth year] DOA: 02/09/2024     0 DOS: the patient was seen and examined on 02/10/2024   Brief hospital course: [name] was admitted to the hospital with the working diagnosis of heart failure exacerbation.   78 yo male with the past medical history of coronary artery disease, hypertension,CKD, T2DM, dementia and myasthenia gravis who presented with dyspnea.  05/19 outpatient cardiology follow up found volume overloaded and recommended to increase furosemide  for a few days.  On his initial physical examination his blood pressure was 159/90, HR 75, RR 20 and 02 saturation 97%. Lungs with decreased breath sounds and poor inspiratory effort, coarse breath sounds, heart with S1 and S2 present and regular with no gallops or rubs, abdomen with no distention and positive lower extremity edema.   Na 135, K 4,8 Cl 105 bicarbonate 23, glucose 273, bun 32 cr 2,0 BNP 142  High sensitive troponin 10 and 13  Wbc 9,4 hgb 12.3 plt 225   Abdominal radiograph with moderate to large stool burden within the ascending colon, with additional findings that may represent sequelae associated with partial small bowel obstruction versus ileus.   Chest radiograph with hypoinflation, with bilateral hilar vascular congestion and small left pleural effusion. Sternotomy wires in place.   EKG 81 bpm, normal axis, normal intervals, qtc 462, sinus rhythm with 1st degree AV block, with no significant ST segment or T wave changes.     Assessment and Plan: * Acute on chronic diastolic CHF (congestive heart failure) ([name]) Echocardiogram 2024 with preserved LV systolic function.   Patient continue volume overloaded.  Plan to increase furosemide  to 60 mg IV bid, to target further negative fluid balance.  Add SGLT 2 inh to augment diuresis.  Follow up on new echocardiogram. Limited medical therapy due to reduced GFR.     Paroxysmal atrial fibrillation  ([name]) Patient sinus rhythm.  Continue anticoagulation with apixaban .   Essential hypertension Continue blood pressure monitoring  Add hydralazine  for afterload reduction, limited pharmacologic options due to acutely reduced GFR.   Coronary artery disease involving native coronary artery of native heart without angina pectoris No chest pain, no acute coronary syndrome.   CKD stage 3b, GFR 30-44 ml/min ([name]) Hyponatremia.   Renal function with serum cr at 2,0, plan to continue diuresis with furosemide  and SGLT 2inh Follow up renal function and electrolytes in am Avoid hypotension and nephrotoxic medications   Type 2 diabetes mellitus with hyperlipidemia ([name]) Uncontrolled hyperglycemia.   Continue glucose cover and monitoring with insulin  sliding scale.  Continue basal insulin    Constipation Radiograph with signs of small bowel obstruction or ileus.   Patient had a bowel movement yesterday and is passing gas Continue close monitoring Continue bowel regimen.   Myasthenia gravis ([name]) No signs of acute decompensation   Obesity, class 1 Calculated BMI is 30,6 Continue Cpap for OSA         Subjective: Patient with no chest pain, continue to have dyspnea and abdominal distention and positive lower extremity edema   Physical Exam: Vitals:   02/10/24 0645 02/10/24 0830 02/10/24 0839 02/10/24 0900  BP: (!) 190/110 (!) 153/96  (!) 117/92  Pulse: (!) 104 82  84  Resp: (!) 23 (!) 39  (!) 24  Temp:   98.4 F (36.9 C)   TempSrc:   Oral   SpO2: 100% 97%  98%  Weight:      Height:       Neurology  awake and alert ENT with mild pallor Cardiovascular with S1 and S2 present and regular with no gallops or rubs, positive systolic murmur at the right lower sternal border Respiratory with rales at bases with no wheezing or rhonchi, poor inspiratory effort Abdomen is distended but not tender Positive lower extremity edema ++  Data Reviewed:    Family Communication: no  family at the bedside   Disposition: Status is: Inpatient Remains inpatient appropriate because: volume overloaded.   Planned Discharge Destination: Home     Author: Albertus Alt, MD 02/10/2024 10:23 AM  For on call review [email or web address].

## 2024-02-10 NOTE — Assessment & Plan Note (Addendum)
 Continue with hydralazine  for afterload reduction, limited pharmacologic options due to acutely reduced GFR.

## 2024-02-10 NOTE — Progress Notes (Signed)
 NIF = -27 cmH2O VC = .96

## 2024-02-10 NOTE — Hospital Course (Addendum)
 [name] was admitted to the hospital with the working diagnosis of heart failure exacerbation.   78 yo male with the past medical history of coronary artery disease, hypertension,CKD, T2DM, dementia and myasthenia gravis who presented with dyspnea.  05/19 outpatient cardiology follow up found volume overloaded and recommended to increase furosemide  for a few days with no significant improvement in his symptoms. EMS was called and patient was brought to the ED.  On his initial physical examination his blood pressure was 159/90, HR 75, RR 20 and 02 saturation 97%. Lungs with decreased breath sounds and poor inspiratory effort, coarse breath sounds, heart with S1 and S2 present and regular with no gallops or rubs, abdomen with no distention and positive lower extremity edema.   Na 135, K 4,8 Cl 105 bicarbonate 23, glucose 273, bun 32 cr 2,0 BNP 142  High sensitive troponin 10 and 13  Wbc 9,4 hgb 12.3 plt 225   Abdominal radiograph with moderate to large stool burden within the ascending colon, with additional findings that may represent sequelae associated with partial small bowel obstruction versus ileus.   Chest radiograph with hypoinflation, with bilateral hilar vascular congestion and small left pleural effusion. Sternotomy wires in place.   EKG 81 bpm, normal axis, normal intervals, qtc 462, sinus rhythm with 1st degree AV block, with no significant ST segment or T wave changes.   Patient was placed on furosemide  for diuresis.   05/30 improved volume status, with worsening renal function.  05/31 continue euvolemic state with no improvement in renal function. Plan to discharge home and follow up as outpatient.

## 2024-02-10 NOTE — ED Notes (Signed)
 Called floor to let them know patient is coming soon

## 2024-02-10 NOTE — ED Notes (Signed)
 Tried to ambulate patient , and patient was not able to stand up without being dizzy and having shortness of breathe.

## 2024-02-10 NOTE — H&P (Signed)
 History and Physical    ADARIAN BUR NWG:[id number] DOB: 1946/05/05 DOA: 02/09/2024  PCP: Jimmey Mould, MD   Patient coming from: Home   Chief Complaint:  Chief Complaint  Patient presents with   Shortness of Breath    HPI:  Fernando Hess is a 78 y.o. male with hx of CAD, CABG, hx STEMI from graft occlusion, HFpEF, moderate AS, PAF on AC, CKD 4, HTN, DM, HLD, OSA, Dementia, aortic ectasia, question of myasthenia gravis, who presents with acute onset of shortness of breath. He begins by telling me he is here because of his MG. Reports acute onset of SOB which he feels is related. And has associated decreased strength, feels about 50% of his normal. No recent change in medications, no currently on therapy for MG. Otherwise no fever, chills, cough / cold symptoms, LE edema, orthopnea. He reports having upper abdominal discomfort with "lung junk" and when asked to clarify what he means cannot explain further. He was recently seen by cardiology 5/19 and noted to have SOB and increased LE edema and lasix  was increased for a few days. Takes 20 mg three times per week at home. Denies other changes in medications.    Review of Systems:  ROS complete and negative except as marked above   Allergies  Allergen Reactions   Cilostazol  Swelling and Other (See Comments)    Edema    Donepezil      Other Reaction(s): seizure   Dulaglutide Nausea And Vomiting and Other (See Comments)    TRULICITY   Levofloxacin Hives, Itching and Rash   Liraglutide Other (See Comments)    Severe fatigue & insomnia   Lisinopril Itching, Rash and Cough    Prior to Admission medications   Medication Sig Start Date End Date Taking? Authorizing Provider  acetaminophen  (TYLENOL ) 500 MG tablet Take 1,000-1,500 mg by mouth every 6 (six) hours as needed for moderate pain (pain score 4-6).   Yes [provider]  apixaban  (ELIQUIS ) 5 MG TABS tablet Take 1 tablet (5 mg total) by mouth 2 (two) times daily.  01/01/23  Yes Dahal, Aminta Baldy, MD  atorvastatin  (LIPITOR ) 40 MG tablet Take 1 tablet (40 mg total) by mouth daily. 02/02/24 05/02/24 Yes Weaver, Scott T, [name]  cyanocobalamin  (VITAMIN B12) 1000 MCG/ML injection Inject 1,000 mcg into the muscle once a week. saturdays   Yes [provider]  furosemide  (LASIX ) 20 MG tablet Take 1 tablet (20 mg total) by mouth every Monday, Wednesday, and Friday. 09/18/23  Yes Pokhrel, Laxman, MD  hydrALAZINE  (APRESOLINE ) 25 MG tablet Take 1 tablet (25 mg total) by mouth every 6 (six) hours as needed (SBP>160 or DBP>110). 01/12/23  Yes Samtani, Jai-Gurmukh, MD  insulin  lispro (HUMALOG ) 100 UNIT/ML KwikPen Inject 8 Units into the skin 3 (three) times daily. 05/15/23  Yes [provider]  LANTUS  SOLOSTAR 100 UNIT/ML Solostar Pen Inject 36 Units into the skin daily. 02/19/23  Yes [provider]  pantoprazole  (PROTONIX ) 40 MG tablet TAKE 1 TABLET BY MOUTH EVERY DAY Patient taking differently: Take 40 mg by mouth in the morning. 11/25/19  Yes Tobin Forts, MD  promethazine -dextromethorphan (PROMETHAZINE -DM) 6.25-15 MG/5ML syrup Take 5 mLs by mouth 4 (four) times daily as needed for cough. 12/15/23  Yes Young, Rupert Counts D, MD  QUEtiapine  (SEROQUEL ) 50 MG tablet Take 50 mg by mouth daily. 02/23/23  Yes [provider]  tamsulosin  (FLOMAX ) 0.4 MG CAPS capsule Take 0.4 mg by mouth daily. 01/10/24  Yes [provider]  TRADJENTA  5 MG TABS tablet Take 5 mg by mouth daily.   Yes [provider]  budesonide  (PULMICORT ) 0.25 MG/2ML nebulizer solution Take 0.25 mg by nebulization daily. Patient not taking: Reported on 02/10/2024 02/13/23   [provider]  feeding supplement (ENSURE ENLIVE / ENSURE PLUS) LIQD Take 237 mLs by mouth 2 (two) times daily between meals. Patient not taking: Reported on 02/10/2024 09/17/23   Pokhrel, Amador Bad, MD  GVOKE HYPOPEN 2-PACK 1 MG/0.2ML SOAJ Inject into the skin. 04/03/23   [provider]  MYRBETRIQ   25 MG TB24 tablet Take 25 mg by mouth at bedtime.    [provider]  nitroGLYCERIN  (NITROSTAT ) 0.4 MG SL tablet Place 0.4 mg under the tongue every 5 (five) minutes as needed for chest pain.    [provider]    Past Medical History:  Diagnosis Date   Acute renal failure (ARF) ([name]) 09/24/2015   Acute stress disorder 11/13/2021   Atrial premature complexes    CAD (coronary artery disease) of artery bypass graft    Early occlusion of saphenous vein graft to intermediate and marginal branch in February 2007 following bypass grafting    CAD (coronary artery disease), native coronary artery 2017   hx NSTEMI 09-24-2015  s/p  CABG x5 on 10-02-2015;  post op STEMI inferolateral wall,  SVG OM1 and SVG OM2 occluded, distal OM occlusion the calpruit, treated medically // Myoview  7/21: no ischemia, EF 65, low risk   CHF (congestive heart failure) ([name])    CKD (chronic kidney disease), stage III ([name])    Contusion of right knee 03/16/2020   Dyspnea    Elevated troponin    Erectile dysfunction    Esophageal reflux    History of atrial fibrillation    post op CABG 10-02-2015   History of kidney stones    History of non-ST elevation myocardial infarction (NSTEMI) 09/24/2015   s/p  CABG x5   History of ST elevation myocardial infarction (STEMI) 10/22/2015   inferior wall,  post op CABG 10-02-2015   Hyperlipidemia    Hypertension    Left ureteral stone    Mild atherosclerosis of both carotid arteries    Nephrolithiasis    per CT bilateral non-obstructive calculi   OSA (obstructive sleep apnea)    Peripheral artery disease    LE Arterial US  01/2019: R PTA and ATA occluded; L ATA occluded   RBBB (right bundle branch block)    Renal atrophy, right    Sleep apnea    wears cpap    ST elevation myocardial infarction (STEMI) of inferior wall ([name]) 10/22/2015   Type 2 diabetes mellitus treated with insulin  ([name])    followed by pcp   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 ([name]) 03/01/2008   Wears glasses     Past Surgical History:  Procedure Laterality Date   APPENDECTOMY  1965   CARDIAC CATHETERIZATION N/A 09/26/2015   Procedure: Left Heart Cath and Coronary Angiography;  Surgeon: Millicent Ally, MD;  Location: [facility] INVASIVE CV LAB;  Service: Cardiovascular;  Laterality: N/A;   CARDIAC CATHETERIZATION N/A 10/22/2015   Procedure: Left Heart Cath and Coronary Angiography;  Surgeon: Arnoldo Lapping, MD;  Location: [facility] INVASIVE CV LAB;  Service: Cardiovascular;  Laterality: N/A;   CATARACT EXTRACTION W/ INTRAOCULAR LENS  IMPLANT, BILATERAL  2017   COLONOSCOPY     CORONARY ARTERY BYPASS GRAFT N/A 10/02/2015   Procedure: CORONARY ARTERY BYPASS GRAFTING (CABG)  X5 LIMA-LAD; SVG-DIAG; SVG-OM; SVG-PD; SVG-RAMUS TRANSESOPHAGEAL ECHOCARDIOGRAM (TEE) ENDOSCOPIC GREATER SAPHENOUS VEIN  HARVEST BILAT LE;  Surgeon: Heriberto London, MD;  Location: [facility] OR;  Service: Open Heart Surgery;  Laterality: N/A;   CYSTOSCOPY/URETEROSCOPY/HOLMIUM LASER/STENT PLACEMENT Left 08/10/2018   Procedure: CYSTOSCOPY/URETEROSCOPY/HOLMIUM LASER/STENT PLACEMENT;  Surgeon: Christina Coyer, MD;  Location: [facility];  Service: Urology;  Laterality: Left;   CYSTOSCOPY/URETEROSCOPY/HOLMIUM LASER/STENT PLACEMENT Left 09/10/2018   Procedure: CYSTOSCOPY/URETEROSCOPY/HOLMIUM LASER/STENT EXCHANGE;  Surgeon: Christina Coyer, MD;  Location: [facility] ORS;  Service: Urology;  Laterality: Left;   HIP ARTHROPLASTY Right 12/26/2022   Procedure: RIGHT HEMI HIP ARTHROPLASTY;  Surgeon: Murleen Arms, MD;  Location: [facility] OR;  Service: Orthopedics;  Laterality: Right;   LEFT HEART CATHETERIZATION WITH CORONARY ANGIOGRAM N/A 04/13/2014   Procedure: LEFT HEART CATHETERIZATION WITH CORONARY ANGIOGRAM;  Surgeon: Dorsey Gault, MD;  Location: [facility] CATH LAB;  Service: Cardiovascular;  Laterality: N/A;   LEG SURGERY Right age 25   closed reduction  leg fracture   NASAL SEPTOPLASTY W/ TURBINOPLASTY Bilateral 08/30/2021   Procedure: NASAL SEPTOPLASTY WITH BILATERAL INFERIOR TURBINATE REDUCTION;  Surgeon: Ammon Bales, MD;  Location: [facility] OR;  Service: ENT;  Laterality: Bilateral;   POLYPECTOMY     RIGHT HEART CATH N/A 06/06/2021   Procedure: RIGHT HEART CATH;  Surgeon: Mardell Shade, MD;  Location: [facility] INVASIVE CV LAB;  Service: Cardiovascular;  Laterality: N/A;   TEE WITHOUT CARDIOVERSION N/A 10/02/2015   Procedure: TRANSESOPHAGEAL ECHOCARDIOGRAM (TEE);  Surgeon: Heriberto London, MD;  Location[facility] OR;  Service: Open Heart Surgery;  Laterality: N/A;   URETEROSCOPY WITH HOLMIUM LASER LITHOTRIPSY Bilateral 2004;  2005  dr grapey  @[facility]    VASECTOMY       reports that he has never smoked. He has never used smokeless tobacco. He reports that he does not currently use alcohol. He reports that he does not use drugs.  Family History  Problem Relation Age of Onset   Heart attack Mother    Heart attack Father    Diabetes Brother    Pancreatic cancer Brother    Diabetes Brother    Colon cancer Neg Hx    Esophageal cancer Neg Hx    Prostate cancer Neg Hx    Rectal cancer Neg Hx    Stomach cancer Neg Hx    Colon polyps Neg Hx      Physical Exam: Vitals:   02/10/24 0030 02/10/24 0045 02/10/24 0100 02/10/24 0115  BP: (!) 159/90 (!) 211/98 (!) 178/77 (!) 186/92  Pulse: 75 79    Resp: 20 15 18  (!) 38  Temp:  97.9 F (36.6 C)    TempSrc:  Oral    SpO2: 99% 97%    Weight:      Height:        Gen: Awake, alert, Chronically ill appearing.   CV: Regular, normal S1, S2, no murmurs  Resp: Intermittent tachypnea, shallow respirations, coarse but otherwise clear.  Abd: distended, hypoactive, hyperresonant, nontender MSK: Symmetric, 1+ pitting edema  Skin: No rashes or lesions to exposed skin  Neuro: Alert and interactive, CN 2-12 intact. Motor with normal neck flexion, 4/5 strength in upper extremities, otherwise full., sensation  intact and equal to fine touch.  Psych: Flat / odd affect.   Data review:   Labs reviewed, notable for:   Creatinine 2 Glucose 270 BNP 142 High-sensitivity Trop 10 -> 13 WBC 9   Micro:  Results for orders placed or performed during the hospital encounter of  02/26/23  SARS Coronavirus 2 by RT PCR (hospital order, performed in [facility] hospital lab) *cepheid single result test* Anterior Nasal Swab     Status: Abnormal   Collection Time: 02/26/23  4:02 PM   Specimen: Anterior Nasal Swab  Result Value Ref Range Status   SARS Coronavirus 2 by RT PCR POSITIVE (A) NEGATIVE Final    Comment: Performed at [facility], [address] [place], [name] [address]    Imaging reviewed:  DG Chest 2 View Result Date: 02/09/2024 CLINICAL DATA:  Shortness of breath.  Chest pain. EXAM: CHEST - 2 VIEW COMPARISON:  09/28/2023 FINDINGS: Prior median sternotomy. Lung volumes are low. The heart is borderline enlarged. Mild vascular congestion. No pleural effusion or pneumothorax. Stable wedging of lower thoracic vertebra. IMPRESSION: Borderline cardiomegaly with mild vascular congestion. Electronically Signed   By: Chadwick Colonel M.D.   On: 02/09/2024 22:21    EKG:  Personally reviewed, sinus rhythm versus ectopic atrial rhythm, no acute ischemic change.  ED Course:  Treated with Lasix  40 mg IV for presumed heart failure exacerbation   Assessment/Plan:  78 y.o. male with hx CAD, CABG, hx STEMI from graft occlusion, HFpEF, moderate AS, PAF on AC, CKD 4, HTN, DM, HLD, OSA, Dementia, aortic ectasia, question of myasthenia gravis, who presents with acute onset of shortness of breath, suspect related to acute exacerbation HFpEF   Acute exacerbation heart failure with preserved ejection fraction History mild to moderate AS Acute onset of dyspnea x 1 day.  Exam with tachypnea 20s to 30s with shallow respirations.  On room air.  BNP 142 similar to prior, high-sensitivity troponin negative x 2.   Chest x-ray with pulmonary vascular congestion.  Last TTE 12/' 24 LVEF 60 to 65% mild LVH, mild moderate AS.  - S/p Lasix  40 mg IV in the ED.  Scheduled for 40 mg IV daily for now - Repeat TTE - Strict I's and O's and daily weights - Heart failure navigator/TOC consult  Question of myasthenia gravis Although patient is concerned about a potential myasthenia gravis flare inpatient neurology and diagnosis of myasthenia gravis is in question. Per Neuro note " Previous myasthenia gravis workup is included antibody testing for acetylcholine receptor antibody, musk, LRP 4 all negative with a negative CT chest. Exacerbations have felt to be related to shortness of breath and difficulty breathing and swallowing but the patient was noted to have failed Mestinon, prednisone and IVIG with plan to start Ultomiris (Ravulizumab, an anti-C5 complement system antibody). "  He has been recommended for a single neurofibrillary EMG outpatient and second opinion from outpatient neurology. - Briefly discussed case with Dr. Renaee Caro neurology.  Feels that underlying myasthenia gravis is unlikely and therefore current presentation unlikely to represent MG flare. Recommends for treatment of cardiac / pulm pathology per above, obtain NIF / VS measurements. If he has no improvement with treatment of above may be appropriate for neurology consult at that time.  - NIF / VC q 12 hr ordered.   HTN urgency  Severe elevation of BP with systolic 180-200s max in the ED.  - Management of volume status and diuresis per above - Hydralazine  5 mg IV every 8 hours as needed for SBP greater than 180, can increase if needed. - If remains with hypertension consider starting on amlodipine   Chronic medical problems: Medication management: Pharm med history not completed at time of admission, med rec completed based on fill hx mainly, f/u after pharm med hx completed.  CAD, CABG, history STEMI from graft occlusion: Continue DOAC.  Hold statin  in setting of possible MG. Paroxysmal A-fib: Currently in sinus rhythm, continue home Eliquis .  CKD stage IV: Creatinine near baseline around 2 Diabetes type 2: Continue home basal ? 36 units daily, and added SSI for very sensitive.  OSA: CPAP nightly History of dementia: Noted Aortic ectasia: Noted outpatient surveillance ? Indication ? Seizure, ? Mood stabilizer: on lamotrigine, continue   Body mass index is 30.67 kg/m. Obesity class I would benefit from weight loss outpatient    DVT prophylaxis:  Eliquis  Code Status:  Full Code Diet:  Diet Orders (From admission, onward)     Start     Ordered   02/10/24 0206  Diet 2 gram sodium Room service appropriate? Yes; Fluid consistency: Thin  Diet effective now       Question Answer Comment  Room service appropriate? Yes   Fluid consistency: Thin      02/10/24 0207           Family Communication:  None   Consults:  None   Admission status:   Inpatient, Telemetry bed  Severity of Illness: The appropriate patient status for this patient is INPATIENT. Inpatient status is judged to be reasonable and necessary in order to provide the required intensity of service to ensure the patient's safety. The patient's presenting symptoms, physical exam findings, and initial radiographic and laboratory data in the context of their chronic comorbidities is felt to place them at high risk for further clinical deterioration. Furthermore, it is not anticipated that the patient will be medically stable for discharge from the hospital within 2 midnights of admission.   * I certify that at the point of admission it is my clinical judgment that the patient will require inpatient hospital care spanning beyond 2 midnights from the point of admission due to high intensity of service, high risk for further deterioration and high frequency of surveillance required.*   Arnulfo Larch, MD [facility]  How to contact the [facility] Attending or Consulting  provider 7A - 7P or covering provider during after hours 7P -7A, for this patient.  Check the care team in [facility] and look for a) attending/consulting [facility] provider listed and b) the [facility] team listed Log into [email or web address] and use [place] universal password to access. If you do not have the password, please contact the hospital operator. Locate the [facility] provider you are looking for under [facility] and page to a number that you can be directly reached. If you still have difficulty reaching the provider, please page the [facility] (Director on Call) for the Hospitalists listed on amion for assistance.  02/10/2024, 2:32 AM

## 2024-02-10 NOTE — TOC CM/SW Note (Signed)
 Transition of Care [facility]) - Inpatient Brief Assessment   Patient Details  Name: Fernando Hess MRN: [id number] Date of Birth: 1946/08/04  Transition of [facility]) CM/SW Contact:    Jennett Model, RN Phone Number: 02/10/2024, 3:27 PM   Clinical Narrative: From home with spouse, has PCP and insurance on file, states has no HH services in place at this time , has walker and w/chair at home.  States he will need ambulance transport to go home at [employer] and wife is support system,  he says he has an agreement witht the ambulance for transport, states gets medications from [facility] on [address].  Pta self ambulatory with walker.  Patient gives this NCM permission to speak with wife.   Transition of Care Asessment: Insurance and Status: Insurance coverage has been reviewed Patient has primary care physician: Yes Home environment has been reviewed: home with wife Prior level of function:: ambulatory with walker Prior/Current Home Services: Current home services (walker, w/chair) Social Drivers of Health Review: SDOH reviewed no interventions necessary Readmission risk has been reviewed: Yes Transition of care needs: no transition of care needs at this time

## 2024-02-10 NOTE — Assessment & Plan Note (Addendum)
 Patient had a bowel movement and is passing gas Ileus or small bowel obstruction were ruled out.  Continue bowel regimen at home.

## 2024-02-10 NOTE — Assessment & Plan Note (Addendum)
 Uncontrolled hyperglycemia. Hgb A1c is 10,8  Patient was placed on insulin  sliding scale for glucose cover and monitoring.  Basal insulin  36 units Pre meal insulin  3 units. (Caution of insulin  use in the setting of worsening GFR)   At the time of discharge will resume pre meal insulin  10 units, lantus  40 units daily and tradjenta .  Fasting glucose this am 206 mg/dl.   Off statin due to diagnosis of myasthenia gravis.

## 2024-02-10 NOTE — Assessment & Plan Note (Signed)
No signs of acute decompensation

## 2024-02-10 NOTE — Progress Notes (Signed)
 Heart Failure Navigator Progress Note  Assessed for Heart & Vascular TOC clinic readiness.  Patient does not meet criteria due to per MD note patient with history of Dementia. No HF TOC. .   Navigator will sign off at this time.   Randie Bustle, BSN, Scientist, clinical (histocompatibility and immunogenetics) Only

## 2024-02-10 NOTE — Assessment & Plan Note (Addendum)
 AKI, Pseudo Hyponatremia. Hypokalemia   At the time of discharge his renal function has a serum cr of 2,92 with K at 3,8 and serum bicarbonate at 23  Na 136   Plan to continue diuresis with furosemide  and follow up renal function and electrolytes as outpatient.

## 2024-02-10 NOTE — Inpatient Diabetes Management (Signed)
 Inpatient Diabetes Program Recommendations  AACE/ADA: New Consensus Statement on Inpatient Glycemic Control (2015)  Target Ranges:  Prepandial:   less than 140 mg/dL      Peak postprandial:   less than 180 mg/dL (1-2 hours)      Critically ill patients:  140 - 180 mg/dL   Lab Results  Component Value Date   GLUCAP 274 (H) 02/10/2024   HGBA1C 10.8 (H) 02/10/2024    Review of Glycemic Control  Latest Reference Range & Units 02/10/24 07:47 02/10/24 12:04  Glucose-Capillary 70 - 99 mg/dL 409 (H) 811 (H)  (H): Data is abnormally high Diabetes history: Type 2 DM Outpatient Diabetes medications: Lantus  36 units every day, Humalog  8 units TID, Tradjenta  5 mg QD Current orders for Inpatient glycemic control: Semglee  36 units every day, Novolog  0-6 units TID  Inpatient Diabetes Program Recommendations:    Briefly discussed outpatient diabetes management with patient. Reports CBGs at home have been in the 200-300's mg/dL. Denies missed doses and able to verify home doses. Reviewed patient's current A1c of 10.8%. Explained what a A1c is and what it measures. Also reviewed goal A1c with patient, importance of good glucose control @ home, and blood sugar goals. Reviewed path of of DM, need for improved control, when to call PCP, impact from cardiac perspective, vascular changes and commorbidities.  Patient has freestyle libre attached to left upper arm. Did not answer questions appropriately regarding CGM. Uses reader device at home. Follow.  Thanks, Marjo Sievert, MSN, RNC-OB Diabetes Coordinator [phone] (8a-5p)

## 2024-02-10 NOTE — Assessment & Plan Note (Signed)
 Calculated BMI is 30,6 Continue Cpap for OSA

## 2024-02-10 NOTE — ED Notes (Addendum)
 Upon first encounter patient was incontinent, with previous external condom catheter not able to maintain proper position on the patient. Went to ED Charge nurse Adriana Hopping to obtain male external catheter with suction to help. Bedding changed, Peri care preformed, New bedding, chucks, and adult brief applied to patient. Suction set to low 40. Patient on cardiac monitor, call light and hospital phone within reach. Bed in lowest possible position side rails up.

## 2024-02-10 NOTE — ED Provider Notes (Signed)
  Physical Exam  BP (!) 186/92   Pulse 79   Temp 97.9 F (36.6 C) (Oral)   Resp (!) 38   Ht 5\' 6"  (1.676 m)   Wt 86.2 kg   SpO2 97%   BMI 30.67 kg/m   Physical Exam Constitutional:      General: He is not in acute distress.    Appearance: Normal appearance.  HENT:     Head: Normocephalic and atraumatic.     Nose: No congestion or rhinorrhea.  Eyes:     General:        Right eye: No discharge.        Left eye: No discharge.     Extraocular Movements: Extraocular movements intact.     Pupils: Pupils are equal, round, and reactive to light.  Cardiovascular:     Rate and Rhythm: Normal rate and regular rhythm.     Heart sounds: No murmur heard. Pulmonary:     Effort: Tachypnea present. No respiratory distress.     Breath sounds: Rales present. No wheezing.  Abdominal:     General: There is no distension.     Tenderness: There is no abdominal tenderness.  Musculoskeletal:        General: Normal range of motion.     Cervical back: Normal range of motion.  Skin:    General: Skin is warm and dry.  Neurological:     General: No focal deficit present.     Mental Status: He is alert.     Procedures  Procedures  ED Course / MDM    Medical Decision Making Amount and/or Complexity of Data Reviewed Labs: ordered. Radiology: ordered. ECG/medicine tests: ordered.  Risk Prescription drug management.   Patient received in handoff.  Shortness of breath with exertion pending laboratory evaluation.  High-sensitivity troponins and D-dimer are reassuringly negative.  BNP minimally elevated.  X-ray with some mild fluid overload.  Patient has been compliant with his home diuresis.  We attempted an amatory pulse ox trial and he became very short of breath with minimal exertion.  Will require hospital admission for further workup.  IV diuresis begun.       Karlyn Overman, MD 02/10/24 [phone]

## 2024-02-10 NOTE — Progress Notes (Signed)
  Echocardiogram 2D Echocardiogram has been performed.  Farley Honer, RDCS 02/10/2024, 9:10 AM

## 2024-02-10 NOTE — Assessment & Plan Note (Signed)
No chest pain, no acute coronary syndrome.  

## 2024-02-10 NOTE — ED Notes (Signed)
 Spoke with patients wife jean, Gave update on patient status.

## 2024-02-11 ENCOUNTER — Encounter (HOSPITAL_COMMUNITY): Payer: Self-pay | Admitting: Internal Medicine

## 2024-02-11 DIAGNOSIS — F0390 Unspecified dementia without behavioral disturbance: Secondary | ICD-10-CM

## 2024-02-11 DIAGNOSIS — I2510 Atherosclerotic heart disease of native coronary artery without angina pectoris: Secondary | ICD-10-CM | POA: Diagnosis not present

## 2024-02-11 DIAGNOSIS — I10 Essential (primary) hypertension: Secondary | ICD-10-CM | POA: Diagnosis not present

## 2024-02-11 DIAGNOSIS — I5033 Acute on chronic diastolic (congestive) heart failure: Secondary | ICD-10-CM | POA: Diagnosis not present

## 2024-02-11 DIAGNOSIS — I480 Paroxysmal atrial fibrillation: Secondary | ICD-10-CM | POA: Diagnosis not present

## 2024-02-11 LAB — GLUCOSE, CAPILLARY
Glucose-Capillary: 292 mg/dL — ABNORMAL HIGH (ref 70–99)
Glucose-Capillary: 276 mg/dL — ABNORMAL HIGH (ref 70–99)
Glucose-Capillary: 406 mg/dL — ABNORMAL HIGH (ref 70–99)
Glucose-Capillary: 405 mg/dL — ABNORMAL HIGH (ref 70–99)
Glucose-Capillary: 316 mg/dL — ABNORMAL HIGH (ref 70–99)
Glucose-Capillary: 257 mg/dL — ABNORMAL HIGH (ref 70–99)
Glucose-Capillary: 323 mg/dL — ABNORMAL HIGH (ref 70–99)

## 2024-02-11 LAB — MAGNESIUM: Magnesium: 1.9 mg/dL (ref 1.7–2.4)

## 2024-02-11 LAB — BASIC METABOLIC PANEL WITH GFR
Anion gap: 13 (ref 5–15)
BUN: 43 mg/dL — ABNORMAL HIGH (ref 8–23)
CO2: 21 mmol/L — ABNORMAL LOW (ref 22–32)
Calcium: 8.9 mg/dL (ref 8.9–10.3)
Chloride: 99 mmol/L (ref 98–111)
Creatinine, Ser: 2.65 mg/dL — ABNORMAL HIGH (ref 0.61–1.24)
GFR, Estimated: 24 mL/min — ABNORMAL LOW (ref >60)
Glucose, Bld: 459 mg/dL — ABNORMAL HIGH (ref 70–99)
Potassium: 4 mmol/L (ref 3.5–5.1)
Sodium: 133 mmol/L — ABNORMAL LOW (ref 135–145)

## 2024-02-11 MED ORDER — INSULIN ASPART 100 UNIT/ML IJ SOLN
0.0000 [IU] | Freq: Three times a day (TID) | INTRAMUSCULAR | Status: DC
  Administered 2024-02-11: 11 [IU] via SUBCUTANEOUS
  Administered 2024-02-12: 15 [IU] via SUBCUTANEOUS
  Administered 2024-02-12: 5 [IU] via SUBCUTANEOUS
  Administered 2024-02-12: 2 [IU] via SUBCUTANEOUS
  Administered 2024-02-13: 5 [IU] via SUBCUTANEOUS

## 2024-02-11 MED ORDER — INSULIN ASPART 100 UNIT/ML IJ SOLN
0.0000 [IU] | Freq: Every day | INTRAMUSCULAR | Status: DC
  Administered 2024-02-11: 4 [IU] via SUBCUTANEOUS
  Administered 2024-02-12: 3 [IU] via SUBCUTANEOUS

## 2024-02-11 MED ORDER — INSULIN ASPART 100 UNIT/ML IJ SOLN: 0.0000 [IU] | Freq: Three times a day (TID) | INTRAMUSCULAR | Status: DC

## 2024-02-11 NOTE — Progress Notes (Signed)
 Mobility Specialist Progress Note:   02/11/24 0940  Mobility  Activity Ambulated with assistance in hallway  Level of Assistance Contact guard assist, steadying assist  Assistive Device Front wheel walker  Distance Ambulated (ft) 200 ft  Activity Response Tolerated well  Mobility Referral Yes  Mobility visit 1 Mobility  Mobility Specialist Start Time (ACUTE ONLY) 0940  Mobility Specialist Stop Time (ACUTE ONLY) 0950  Mobility Specialist Time Calculation (min) (ACUTE ONLY) 10 min   Pt agreeable to mobility session. Required only minG assist to ambulate with RW. Pt c/o minor initial dizziness, however improved with time. Pt back in bed with all needs met.  Oneda Big Mobility Specialist Please contact via SecureChat or  Rehab office at [phone]

## 2024-02-11 NOTE — Plan of Care (Signed)
   Problem: Education: Goal: Ability to describe self-care measures that may prevent or decrease complications (Diabetes Survival Skills Education) will improve Outcome: Progressing Goal: Individualized Educational Video(s) Outcome: Progressing   Problem: Coping: Goal: Ability to adjust to condition or change in health will improve Outcome: Progressing

## 2024-02-11 NOTE — Progress Notes (Signed)
NIF -35 VC 1.2 L

## 2024-02-11 NOTE — Progress Notes (Signed)
 Progress Note   Patient: Fernando Hess:[id number] DOB: December 01, 1945 DOA: 02/09/2024     1 DOS: the patient was seen and examined on 02/11/2024   Brief hospital course: Mr. [facility] Hammans was admitted to the hospital with the working diagnosis of heart failure exacerbation.   78 yo male with the past medical history of coronary artery disease, hypertension,CKD, T2DM, dementia and myasthenia gravis who presented with dyspnea.  05/19 outpatient cardiology follow up found volume overloaded and recommended to increase furosemide  for a few days.  On his initial physical examination his blood pressure was 159/90, HR 75, RR 20 and 02 saturation 97%. Lungs with decreased breath sounds and poor inspiratory effort, coarse breath sounds, heart with S1 and S2 present and regular with no gallops or rubs, abdomen with no distention and positive lower extremity edema.   Na 135, K 4,8 Cl 105 bicarbonate 23, glucose 273, bun 32 cr 2,0 BNP 142  High sensitive troponin 10 and 13  Wbc 9,4 hgb 12.3 plt 225   Abdominal radiograph with moderate to large stool burden within the ascending colon, with additional findings that may represent sequelae associated with partial small bowel obstruction versus ileus.   Chest radiograph with hypoinflation, with bilateral hilar vascular congestion and small left pleural effusion. Sternotomy wires in place.   EKG 81 bpm, normal axis, normal intervals, qtc 462, sinus rhythm with 1st degree AV block, with no significant ST segment or T wave changes.   Patient was placed on furosemide  for diuresis.   Assessment and Plan: * Acute on chronic diastolic CHF (congestive heart failure) ([name]) Echocardiogram with preserved LV systolic function, with moderate septal hypertrophy and severe hypertrophy of the mid inferior myocardium. LV EF 60 to 65%, RV systolic function preserved, LA with mild dilatation, RVSP 19.6 mmHg    Urine output is 2,950 ml Systolic blood pressure 130 mmHg  range  Patient had 60 mg IV furosemide  this morning, will hold on pm dose.  Continue with SGLT 2 inh to augment diuresis.   Limited medical therapy due to acutely reduced GFR.    Paroxysmal atrial fibrillation ([name]) Patient sinus rhythm.  Continue anticoagulation with apixaban .   Essential hypertension Continue blood pressure monitoring  Continue with hydralazine  for afterload reduction, limited pharmacologic options due to acutely reduced GFR.   Coronary artery disease involving native coronary artery of native heart without angina pectoris No chest pain, no acute coronary syndrome.   CKD stage 3b, GFR 30-44 ml/min ([name]) AKI, Pseudo Hyponatremia.   Worsening renal function with serum cr at 2,6 with K at 4,0, serum bicarbonate at 21 Na 133  Mg 1,9   Hold on IV mag due to myasthenia gravis.  Hold PM dose of furosemide  Follow up renal function and electrolytes in am.   Type 2 diabetes mellitus with hyperlipidemia ([name]) Uncontrolled hyperglycemia.  Fasting glucose this am 459  Will increase dose of basal insulin ,  Continue basal insulin    Constipation Radiograph with signs of small bowel obstruction or ileus.   Patient had a bowel movement and is passing gas Continue close monitoring Continue bowel regimen.   Myasthenia gravis ([name]) No signs of acute decompensation   Obesity, class 1 Calculated BMI is 30,6 Continue Cpap for OSA   Dementia without behavioral disturbance ([name]) Continue with quetiapine        Subjective: Patient with improvement in dyspnea and edema, no chest pain, his glucose has been elevated. Continue very weak and deconditioned   Physical Exam: Vitals:   02/11/24  1610 02/11/24 0725 02/11/24 0732 02/11/24 1120  BP: 92/71 108/82  127/74  Pulse: 93 88  81  Resp: 20 (!) 22 18 18   Temp: 98.5 F (36.9 C) 98 F (36.7 C)  97.7 F (36.5 C)  TempSrc: Oral Oral  Oral  SpO2: 92% 92% 94% 94%  Weight: 84.2 kg     Height:       Neurology awake  and alert ENT with mild pallor Cardiovascular with S1 and S2 present and regular with no gallops, rubs or murmurs Respiratory with mild rales at bases with no wheezing or rhonchi  Abdomen with no distention  Trace lower extremity edema   Data Reviewed:    Family Communication: no family at the bedside   Disposition: Status is: Inpatient Remains inpatient appropriate because: recovering from heart failure exacerbation   Planned Discharge Destination: Home    Author: Albertus Alt, MD 02/11/2024 2:43 PM  For on call review [email or web address].

## 2024-02-11 NOTE — Assessment & Plan Note (Signed)
Continue with quetiapine

## 2024-02-11 NOTE — Progress Notes (Signed)
NIF -30  VC 1.1L

## 2024-02-11 NOTE — Plan of Care (Signed)
  Problem: Education: Goal: Ability to describe self-care measures that may prevent or decrease complications (Diabetes Survival Skills Education) will improve 02/11/2024 2330 by Hisae Decoursey K, RN Outcome: Progressing 02/11/2024 2329 by Karman Veney K, RN Outcome: Progressing Goal: Individualized Educational Video(s) 02/11/2024 2330 by Paulanthony Gleaves K, RN Outcome: Progressing 02/11/2024 2329 by Diania Co K, RN Outcome: Progressing   Problem: Coping: Goal: Ability to adjust to condition or change in health will improve 02/11/2024 2330 by Floreine Kingdon K, RN Outcome: Progressing 02/11/2024 2329 by Sanai Frick K, RN Outcome: Progressing

## 2024-02-12 DIAGNOSIS — I480 Paroxysmal atrial fibrillation: Secondary | ICD-10-CM | POA: Diagnosis not present

## 2024-02-12 DIAGNOSIS — I2510 Atherosclerotic heart disease of native coronary artery without angina pectoris: Secondary | ICD-10-CM | POA: Diagnosis not present

## 2024-02-12 DIAGNOSIS — I10 Essential (primary) hypertension: Secondary | ICD-10-CM | POA: Diagnosis not present

## 2024-02-12 DIAGNOSIS — I5033 Acute on chronic diastolic (congestive) heart failure: Secondary | ICD-10-CM | POA: Diagnosis not present

## 2024-02-12 LAB — GLUCOSE, CAPILLARY
Glucose-Capillary: 146 mg/dL — ABNORMAL HIGH (ref 70–99)
Glucose-Capillary: 405 mg/dL — ABNORMAL HIGH (ref 70–99)
Glucose-Capillary: 240 mg/dL — ABNORMAL HIGH (ref 70–99)
Glucose-Capillary: 256 mg/dL — ABNORMAL HIGH (ref 70–99)

## 2024-02-12 LAB — BASIC METABOLIC PANEL WITH GFR
Anion gap: 14 (ref 5–15)
BUN: 53 mg/dL — ABNORMAL HIGH (ref 8–23)
CO2: 24 mmol/L (ref 22–32)
Calcium: 8.4 mg/dL — ABNORMAL LOW (ref 8.9–10.3)
Chloride: 98 mmol/L (ref 98–111)
Creatinine, Ser: 3.31 mg/dL — ABNORMAL HIGH (ref 0.61–1.24)
GFR, Estimated: 18 mL/min — ABNORMAL LOW (ref >60)
Glucose, Bld: 190 mg/dL — ABNORMAL HIGH (ref 70–99)
Potassium: 3.3 mmol/L — ABNORMAL LOW (ref 3.5–5.1)
Sodium: 136 mmol/L (ref 135–145)

## 2024-02-12 LAB — MAGNESIUM: Magnesium: 2.1 mg/dL (ref 1.7–2.4)

## 2024-02-12 MED ORDER — INSULIN ASPART 100 UNIT/ML IJ SOLN
3.0000 [IU] | Freq: Three times a day (TID) | INTRAMUSCULAR | Status: DC
  Administered 2024-02-12 – 2024-02-13 (×2): 3 [IU] via SUBCUTANEOUS

## 2024-02-12 MED ORDER — INSULIN ASPART 100 UNIT/ML IJ SOLN: 5.0000 [IU] | Freq: Three times a day (TID) | INTRAMUSCULAR | Status: DC

## 2024-02-12 MED ORDER — TAMSULOSIN HCL 0.4 MG PO CAPS: 0.4000 mg | ORAL_CAPSULE | Freq: Every day | ORAL | Status: DC

## 2024-02-12 MED ORDER — POTASSIUM CHLORIDE CRYS ER 20 MEQ PO TBCR
20.0000 meq | EXTENDED_RELEASE_TABLET | Freq: Once | ORAL | Status: AC
  Administered 2024-02-12: 20 meq via ORAL
  Filled 2024-02-12: qty 1

## 2024-02-12 NOTE — Progress Notes (Addendum)
 Progress Note   Patient: Fernando Hess ZOX:[id number] DOB: [birth year] DOA: 02/09/2024     2 DOS: the patient was seen and examined on 02/12/2024   Brief hospital course: Mr.  Hammans was admitted to the hospital with the working diagnosis of heart failure exacerbation.   78 yo male with the past medical history of coronary artery disease, hypertension,CKD, T2DM, dementia and myasthenia gravis who presented with dyspnea.  05/19 outpatient cardiology follow up found volume overloaded and recommended to increase furosemide  for a few days.  On his initial physical examination his blood pressure was 159/90, HR 75, RR 20 and 02 saturation 97%. Lungs with decreased breath sounds and poor inspiratory effort, coarse breath sounds, heart with S1 and S2 present and regular with no gallops or rubs, abdomen with no distention and positive lower extremity edema.   Na 135, K 4,8 Cl 105 bicarbonate 23, glucose 273, bun 32 cr 2,0 BNP 142  High sensitive troponin 10 and 13  Wbc 9,4 hgb 12.3 plt 225   Abdominal radiograph with moderate to large stool burden within the ascending colon, with additional findings that may represent sequelae associated with partial small bowel obstruction versus ileus.   Chest radiograph with hypoinflation, with bilateral hilar vascular congestion and small left pleural effusion. Sternotomy wires in place.   EKG 81 bpm, normal axis, normal intervals, qtc 462, sinus rhythm with 1st degree AV block, with no significant ST segment or T wave changes.   Patient was placed on furosemide  for diuresis.   05/30 improved volume status, with worsening renal function.   Assessment and Plan: * Acute on chronic diastolic CHF (congestive heart failure) ([name]) Echocardiogram with preserved LV systolic function, with moderate septal hypertrophy and severe hypertrophy of the mid inferior myocardium. LV EF 60 to 65%, RV systolic function preserved, LA with mild dilatation, RVSP 19.6 mmHg    Urine  output is 1,500 ml Systolic blood pressure 110 mmHg range   Continue to hold on loop diuretic and hold SGLT 2 inh  Limited medical therapy due to acutely reduced GFR.   Paroxysmal atrial fibrillation ([name]) Patient sinus rhythm.  Continue anticoagulation with apixaban .   Essential hypertension Continue blood pressure monitoring  Continue with hydralazine  for afterload reduction, limited pharmacologic options due to acutely reduced GFR.   Coronary artery disease involving native coronary artery of native heart without angina pectoris No chest pain, no acute coronary syndrome.   CKD stage 3b, GFR 30-44 ml/min ([name]) AKI, Pseudo Hyponatremia. Hypokalemia   Continue worsening renal function with serum cr at 3,31 with K at 3,3 and serum bicarbonate at 24  Na 136 and Mg 2.1   Add 20 meq Kcl today  Hold diuretic therapy and follow up renal function and electrolytes in am Avoid hypotension and nephrotoxic medications.   Type 2 diabetes mellitus with hyperlipidemia ([name]) Uncontrolled hyperglycemia.  Continue insulin  sliding scale for glucose cover and monitoring.  Basal insulin  36 units Add pre meal insulin  3 units. (Caution of insulin  use in the setting of worsening GFR)   Fasting glucose this am 190   Off statin due to diagnosis of myasthenia gravis.    Constipation Radiograph with signs of small bowel obstruction or ileus.   Patient had a bowel movement and is passing gas Continue close monitoring Continue bowel regimen.   Myasthenia gravis ([name]) No signs of acute decompensation   Obesity, class 1 Calculated BMI is 30,6 Continue Cpap for OSA   Dementia without behavioral disturbance ([name]) Continue with quetiapine   Subjective: Patient with improvement in dyspnea and resolution of lower extremity edema, no chest pain, this morning out of the bed to the chair.   Physical Exam: Vitals:   02/12/24 0820 02/12/24 0825 02/12/24 1110 02/12/24 1324  BP: (!) 116/56 (!)  116/56 125/64 127/76  Pulse:  82 88   Resp:  15 18   Temp:  98.7 F (37.1 C) 98.6 F (37 C)   TempSrc:  Oral Oral   SpO2:  93% 96%   Weight:      Height:       Neurology awake and alert ENT with mild pallor Cardiovascular with S1 and S2 present and regular with no gallops, rubs or murmurs Respiratory with no rales or wheezing with no rhonchi  Abdomen with no distention  No lower extremity edema  Data Reviewed:    Family Communication: I spoke with patient's wife over the phone at the bedside, we talked in detail about patient's condition, plan of care and prognosis and all questions were addressed.   Disposition: Status is: Inpatient Remains inpatient appropriate because: pending renal function recovery   Planned Discharge Destination: Home    Author: Albertus Alt, MD 02/12/2024 2:17 PM  For on call review [email or web address].

## 2024-02-12 NOTE — Care Management Important Message (Signed)
 Important Message  Patient Details  Name: Fernando Hess MRN: [id number] Date of Birth: 1945/11/09   Important Message Given:  Yes - Medicare IM     [name] 02/12/2024, 10:19 AM

## 2024-02-12 NOTE — Progress Notes (Signed)
 Pt performed with good effort.  NIF -40 VC 1L

## 2024-02-12 NOTE — Progress Notes (Signed)
 OT Cancellation Note  Patient Details Name: ENDRE COUTTS MRN: [id number] DOB: 1946-05-25   Cancelled Treatment:    Reason Eval/Treat Not Completed: OT screened, no needs identified, will sign off.   Jonette Nestle 02/12/2024, 10:51 AM Avanell Leigh, OTR/L Acute Rehabilitation Services Office: [phone]

## 2024-02-12 NOTE — Progress Notes (Signed)
 Pt unavailable x multiple attempts for NIF and VC

## 2024-02-12 NOTE — Inpatient Diabetes Management (Signed)
 Inpatient Diabetes Program Recommendations  AACE/ADA: New Consensus Statement on Inpatient Glycemic Control (2015)  Target Ranges:  Prepandial:   less than 140 mg/dL      Peak postprandial:   less than 180 mg/dL (1-2 hours)      Critically ill patients:  140 - 180 mg/dL   Lab Results  Component Value Date   GLUCAP 405 (H) 02/12/2024   HGBA1C 10.8 (H) 02/10/2024    Review of Glycemic Control  Latest Reference Range & Units 02/11/24 08:02 02/11/24 11:17 02/11/24 14:58 02/11/24 15:46 02/11/24 21:07 02/12/24 06:04 02/12/24 11:05  Glucose-Capillary 70 - 99 mg/dL 161 (H) 096 (H) 045 (H) 257 (H) 323 (H) 146 (H) 405 (H)  (H): Data is abnormally high  Diabetes history: Type 2 DM Outpatient Diabetes medications: Lantus  36 units every day, Humalog  8 units TID, Tradjenta  5 mg QD Current orders for Inpatient glycemic control: Semglee  36 units every day, Novolog  0-15 units TID & 0-5 QHS  Inpatient Diabetes Program Recommendations:    Postprandial significantly elevated.  Please consider:  Novolog  5 units TID with meals if he consumes at least 50%  Thank you, Hays Lipschutz, MSN, CDCES Diabetes Coordinator Inpatient Diabetes Program [phone] (team pager from 8a-5p)

## 2024-02-12 NOTE — Evaluation (Signed)
 Physical Therapy Brief Evaluation and Discharge Note Patient Details Name: Fernando Hess MRN: [id number] DOB: 03/09/1946 Today's Date: 02/12/2024   History of Present Illness  Fernando Hess is a 78 y.o. male admitted 02/09/24 for heart failure exacerbation. Pt presented with dyspnea. Abdominal radiograph with moderate to large stool burden within the ascending colon, with additional findings that may represent sequelae associated with partial small bowel obstruction versus ileus. Chest radiograph with hypoinflation, with bilateral hilar vascular congestion and small left pleural effusion. PMH of CAD, HTN, CKD, T2DM, dementia, and  myasthenia gravis.   Clinical Impression  Pt greeted seated in recliner chair, pleasant and agreeable to PT evaluation. PTA, pt was modI for functional mobility using rollator and received intermittent assistance with ADLs. He lives with his wife in a two story house with 5 STE and a lift chair to reach upstairs. Pt reports his wife is able to provide 24/7 supervision and light assistance. He completed functional mobility with modI using a RW. He ambulated ~431ft and ascended/descended 5 steps sideways with BUE support on RHR. Pt completed the DGI and scored 9/24, which indicates he is at increased fall risk. Discussed balance exercises and the option for follow-up therapy for balance training to which the pt declined. Patient feels ready and safe for discharge home. I have answered all his questions related to mobility. He appears to be at his baseline function, no further PT needs.      PT Assessment Patient does not need any further PT services  Assistance Needed at Discharge  PRN    Equipment Recommendations None recommended by PT (Pt already has DME)  Recommendations for Other Services       Precautions/Restrictions Precautions Precautions: Fall Recall of Precautions/Restrictions: Intact Restrictions Weight Bearing Restrictions Per Provider Order: No         Mobility  Bed Mobility       General bed mobility comments: Not assessed. Pt greeted seated in recliner chair.  Transfers Overall transfer level: Modified independent Equipment used: Rolling walker (2 wheels)               General transfer comment: Pt stood from recliner chair without physical assist. He demonstrated proper sequencing. Good eccentric control with sitting.    Ambulation/Gait Ambulation/Gait assistance: Modified independent (Device/Increase time) Gait Distance (Feet): 400 Feet Assistive device: Rolling walker (2 wheels) Gait Pattern/deviations: Step-through pattern, Decreased dorsiflexion - right, Decreased dorsiflexion - left Gait Speed: Pace WFL General Gait Details: Pt ambulated with a reciprocal gait pattern. He maintained flat foot contact and limited push off. Pt navigated obstalces within the hallway well without LOB.  Home Activity Instructions Home Activity Instructions: Encouraged pt to engage in daily walks, UE/LE exercises, and discussed balance exercises. Pt declined a HEP handout.  Stairs Stairs: Yes Stairs assistance: Modified independent (Device/Increase time) Stair Management: One rail Right, Sideways, Step to pattern Number of Stairs: 5 General stair comments: Pt ascended/descended 5 steps sideways with BUE support on handrail and both feet on each step.  Modified Rankin (Stroke Patients Only)        Balance Overall balance assessment: Needs assistance Sitting-balance support: No upper extremity supported Sitting balance-Leahy Scale: Good     Standing balance support: Bilateral upper extremity supported, During functional activity Standing balance-Leahy Scale: Fair   Standardized Balance Assessment Standardized Balance Assessment : Dynamic Gait Index Dynamic Gait Index Level Surface: Mild Impairment Change in Gait Speed: Moderate Impairment Gait with Horizontal Head Turns: Moderate Impairment Gait with Vertical Head  Turns:  Moderate Impairment Gait and Pivot Turn: Moderate Impairment Step Over Obstacle: Moderate Impairment Step Around Obstacles: Moderate Impairment Steps: Moderate Impairment Total Score: 9      Pertinent Vitals/Pain PT - Brief Vital Signs All Vital Signs Stable: Yes Pain Assessment Pain Assessment: No/denies pain     Home Living Family/patient expects to be discharged to:: Private residence Living Arrangements: Spouse/significant other Available Help at Discharge: Family;Available 24 hours/day Home Environment: Stairs to enter;Rail - right;Stairs in home  Stairs-Number of Steps: 2 flights, but uses lift chair Home Equipment: Rollator (4 wheels);Shower seat;Grab bars - tub/shower;BSC/3in1;Wheelchair - manual;Lift chair        Prior Function Level of Independence: Independent with assistive device(s) Comments: Ambulates using rollator. Doesn't leave the house much. Has had 2 falls in the last 70mo while sitting on rollator seat and attempting to transfer. Able to complete ADLs with increased time. Reports his wife will assist to speed up the progress. Pt is unable to drive. Wife handles household management, but recently broke her back and is healing nicely.    UE/LE Assessment   UE ROM/Strength/Tone/Coordination: WFL    LE ROM/Strength/Tone/Coordination: [facility]      Communication   Communication Communication: No apparent difficulties     Cognition Overall Cognitive Status: Appears within functional limits for tasks assessed/performed       General Comments      Exercises     Assessment/Plan    PT Problem List         PT Visit Diagnosis Unsteadiness on feet (R26.81)    No Skilled PT Patient at baseline level of functioning;Patient is modified independent with all activity/mobility;All education completed   Co-evaluation                AMPAC 6 Clicks Help needed turning from your back to your side while in a flat bed without using bedrails?:  None Help needed moving from lying on your back to sitting on the side of a flat bed without using bedrails?: None Help needed moving to and from a bed to a chair (including a wheelchair)?: None Help needed standing up from a chair using your arms (e.g., wheelchair or bedside chair)?: None Help needed to walk in hospital room?: None Help needed climbing 3-5 steps with a railing? : None 6 Click Score: 24      End of Session Equipment Utilized During Treatment: Gait belt Activity Tolerance: Patient tolerated treatment well Patient left: in bed;with call bell/phone within reach Nurse Communication: Mobility status PT Visit Diagnosis: Unsteadiness on feet (R26.81)     Time: 1610-9604 PT Time Calculation (min) (ACUTE ONLY): 20 min  Charges:   PT Evaluation $PT Eval Low Complexity: 1 Low      Glenford Lanes, PT, DPT Acute Rehabilitation Services Office: [phone] Secure Chat Preferred  Riva Chester  02/12/2024, 10:50 AM

## 2024-02-13 ENCOUNTER — Other Ambulatory Visit (HOSPITAL_COMMUNITY): Payer: Self-pay

## 2024-02-13 LAB — RENAL FUNCTION PANEL
Albumin: 3.1 g/dL — ABNORMAL LOW (ref 3.5–5.0)
Anion gap: 11 (ref 5–15)
BUN: 53 mg/dL — ABNORMAL HIGH (ref 8–23)
CO2: 23 mmol/L (ref 22–32)
Calcium: 8.4 mg/dL — ABNORMAL LOW (ref 8.9–10.3)
Chloride: 102 mmol/L (ref 98–111)
Creatinine, Ser: 2.92 mg/dL — ABNORMAL HIGH (ref 0.61–1.24)
GFR, Estimated: 21 mL/min — ABNORMAL LOW (ref >60)
Glucose, Bld: 206 mg/dL — ABNORMAL HIGH (ref 70–99)
Phosphorus: 2.1 mg/dL — ABNORMAL LOW (ref 2.5–4.6)
Potassium: 3.8 mmol/L (ref 3.5–5.1)
Sodium: 136 mmol/L (ref 135–145)

## 2024-02-13 LAB — GLUCOSE, CAPILLARY: Glucose-Capillary: 246 mg/dL — ABNORMAL HIGH (ref 70–99)

## 2024-02-13 MED ORDER — HYDRALAZINE HCL 25 MG PO TABS
25.0000 mg | ORAL_TABLET | Freq: Two times a day (BID) | ORAL | 0 refills | Status: DC
  Filled 2024-02-13: qty 60, 30d supply, fill #0

## 2024-02-13 MED ORDER — ACETAMINOPHEN 325 MG PO TABS
650.0000 mg | ORAL_TABLET | Freq: Four times a day (QID) | ORAL | Status: DC | PRN
  Administered 2024-02-13: 650 mg via ORAL
  Filled 2024-02-13: qty 2

## 2024-02-13 MED ORDER — FUROSEMIDE 40 MG PO TABS: 40.0000 mg | ORAL_TABLET | Freq: Every day | ORAL | Status: DC

## 2024-02-13 MED ORDER — POLYETHYLENE GLYCOL 3350 17 GM/SCOOP PO POWD
17.0000 g | Freq: Every day | ORAL | 0 refills | Status: DC
  Filled 2024-02-13: qty 238, 14d supply, fill #0

## 2024-02-13 MED ORDER — FUROSEMIDE 40 MG PO TABS
40.0000 mg | ORAL_TABLET | Freq: Every day | ORAL | 0 refills | Status: DC
  Filled 2024-02-13: qty 30, 30d supply, fill #0

## 2024-02-13 MED ORDER — POLYETHYLENE GLYCOL 3350 17 G PO PACK: 17.0000 g | PACK | Freq: Every day | ORAL | Status: DC

## 2024-02-13 MED ORDER — ACETAMINOPHEN 325 MG PO TABS: 650.0000 mg | ORAL_TABLET | Freq: Four times a day (QID) | ORAL | Status: DC | PRN

## 2024-02-13 NOTE — Plan of Care (Signed)
 Notified patient had fever up to 102.3 F.  No orders in place for acetaminophen .  Orders placed for acetaminophen  650 mg p.o. as needed every 6 hours for fever, headache, and/or mild pain.

## 2024-02-13 NOTE — Discharge Summary (Addendum)
 Physician Discharge Summary   Patient: Fernando Hess MRN: [id number] DOB: [birth year]  Admit date:     02/09/2024  Discharge date: 02/13/24  Discharge Physician: Fernando Hess   PCP: Fernando Mould, MD   Recommendations at discharge:    Patient will continue diuresis with furosemide  40 mg po daily Added hydralazine  for afterload reduction, limited medical therapy for heart failure due to acutely reduced GFR and risk of hypotension. Holding on lamotrigine  until follow up as outpatient (his wife wants to check with primary care before starting this medication).  Follow up renal function and electrolytes within 7 days as outpatient Follow up with Fernando Hess June 12/2023.   I was not able to reach Fernando Hess over the phone today. I left a message.   Discharge Diagnoses: Principal Problem:   Acute on chronic diastolic CHF (congestive heart failure) ([name]) Active Problems:   Paroxysmal atrial fibrillation ([name])   Essential hypertension   Coronary artery disease involving native coronary artery of native heart without angina pectoris   CKD stage 3b, GFR 30-44 ml/min ([name])   Type 2 diabetes mellitus with hyperlipidemia ([name])   Constipation   Myasthenia gravis ([name])   Obesity, class 1   Dementia without behavioral disturbance ([name])  Resolved Problems:   * No resolved hospital problems. [facility] Course: Fernando Hess was admitted to the hospital with the working diagnosis of heart failure exacerbation.   78 yo male with the past medical history of coronary artery disease, hypertension,CKD, T2DM, dementia and myasthenia gravis who presented with dyspnea.  05/19 outpatient cardiology follow up found volume overloaded and recommended to increase furosemide  for a few days with no significant improvement in his symptoms. EMS was called and patient was brought to the ED.  On his initial physical examination his blood pressure was 159/90, HR 75, RR 20 and 02 saturation 97%. Lungs  with decreased breath sounds and poor inspiratory effort, coarse breath sounds, heart with S1 and S2 present and regular with no gallops or rubs, abdomen with no distention and positive lower extremity edema.   Na 135, K 4,8 Cl 105 bicarbonate 23, glucose 273, bun 32 cr 2,0 BNP 142  High sensitive troponin 10 and 13  Wbc 9,4 hgb 12.3 plt 225   Abdominal radiograph with moderate to large stool burden within the ascending colon, with additional findings that may represent sequelae associated with partial small bowel obstruction versus ileus.   Chest radiograph with hypoinflation, with bilateral hilar vascular congestion and small left pleural effusion. Sternotomy wires in place.   EKG 81 bpm, normal axis, normal intervals, qtc 462, sinus rhythm with 1st degree AV block, with no significant ST segment or T wave changes.   Patient was placed on furosemide  for diuresis.   05/30 improved volume status, with worsening renal function.  05/31 continue euvolemic state with no improvement in renal function. Plan to discharge home and follow up as outpatient.   Assessment and Plan: * Acute on chronic diastolic CHF (congestive heart failure) ([name]) Echocardiogram with preserved LV systolic function, with moderate septal hypertrophy and severe hypertrophy of the mid inferior myocardium. LV EF 60 to 65%, RV systolic function preserved, LA with mild dilatation, RVSP 19.6 mmHg    Patient was placed on IV furosemide  for diuresis, negative fluid balance was achieved, - 4,380 ml, with significant improvement in his symptoms.    Plan to continue diuresis at home with furosemide .  Limited medical therapy due to acutely reduced GFR.  Paroxysmal atrial fibrillation ([name]) Telemetry with sinus arrhythmia with PAC. Rate controlled at 80 bpm.  Continue anticoagulation with apixaban .   Essential hypertension Continue with hydralazine  for afterload reduction, limited pharmacologic options due to acutely reduced  GFR.   Coronary artery disease involving native coronary artery of native heart without angina pectoris No chest pain, no acute coronary syndrome.   CKD stage 3b, GFR 30-44 ml/min ([name]) AKI, Pseudo Hyponatremia. Hypokalemia   At the time of discharge his renal function has a serum cr of 2,92 with K at 3,8 and serum bicarbonate at 23  Na 136   Plan to continue diuresis with furosemide  and follow up renal function and electrolytes as outpatient.   Type 2 diabetes mellitus with hyperlipidemia ([name]) Uncontrolled hyperglycemia. Hgb A1c is 10,8  Patient was placed on insulin  sliding scale for glucose cover and monitoring.  Basal insulin  36 units Pre meal insulin  3 units. (Caution of insulin  use in the setting of worsening GFR)   Fasting glucose this am 206 mg/dl.   Off statin due to diagnosis of myasthenia gravis.   Constipation Patient had a bowel movement and is passing gas Ileus or small bowel obstruction were ruled out.  Continue bowel regimen at home.   Myasthenia gravis ([name]) No signs of acute decompensation   Obesity, class 1 Calculated BMI is 30,6 Continue Cpap for OSA   Dementia without behavioral disturbance ([name]) Continue with quetiapine        Consultants: none  Procedures performed: none   Disposition: Home Diet recommendation:  Cardiac and Carb modified diet DISCHARGE MEDICATION: Allergies as of 02/13/2024       Reactions   Cilostazol  Swelling, Other (See Comments)   Edema   Magnesium -containing Compounds Other (See Comments)   Avoid magnesium  with hx myasthenia gravis (can worsen muscle weakness and trigger a severe exacerbation)   Donepezil  Other (See Comments)   seizure   Dulaglutide Nausea And Vomiting, Other (See Comments)   TRULICITY   Levofloxacin Hives, Itching, Rash   Liraglutide Other (See Comments)   Severe fatigue & insomnia   Lisinopril Itching, Rash, Cough        Medication List     STOP taking these medications     atorvastatin  40 MG tablet Commonly known as: LIPITOR    budesonide  0.25 MG/2ML nebulizer solution Commonly known as: PULMICORT    feeding supplement Liqd   Gvoke HypoPen 2-Pack 1 MG/0.2ML Soaj Generic drug: Glucagon   lamoTRIgine  25 MG tablet Commonly known as: LAMICTAL    nitroGLYCERIN  0.4 MG SL tablet Commonly known as: NITROSTAT        TAKE these medications    acetaminophen  325 MG tablet Commonly known as: TYLENOL  Take 2 tablets (650 mg total) by mouth every 6 (six) hours as needed for moderate pain (pain score 4-6) or mild pain (pain score 1-3). What changed:  medication strength how much to take   apixaban  5 MG Tabs tablet Commonly known as: ELIQUIS  Take 1 tablet (5 mg total) by mouth 2 (two) times daily.   cyanocobalamin  1000 MCG/ML injection Commonly known as: VITAMIN B12 Inject 1,000 mcg into the muscle once a week. saturdays   FreeStyle Libre 2 Sensor Misc Inject 1 Device into the skin every 14 (fourteen) days.   furosemide  40 MG tablet Commonly known as: LASIX  Take 1 tablet (40 mg total) by mouth daily. Start taking on: February 14, 2024 What changed:  medication strength how much to take when to take this   hydrALAZINE  25 MG tablet Commonly known as:  APRESOLINE  Take 1 tablet (25 mg total) by mouth in the morning and at bedtime.   insulin  lispro 100 UNIT/ML KwikPen Commonly known as: HUMALOG  Inject 10 Units into the skin 3 (three) times daily.   Lantus  SoloStar 100 UNIT/ML Solostar Pen Generic drug: insulin  glargine Inject 40 Units into the skin daily.   Myrbetriq  25 MG Tb24 tablet Generic drug: mirabegron  ER Take 25 mg by mouth at bedtime.   pantoprazole  40 MG tablet Commonly known as: PROTONIX  TAKE 1 TABLET BY MOUTH EVERY DAY What changed: when to take this   polyethylene glycol 17 g packet Commonly known as: MIRALAX  / GLYCOLAX  Take 17 g by mouth daily.   QUEtiapine  50 MG tablet Commonly known as: SEROQUEL  Take 50 mg by mouth  daily.   tamsulosin  0.4 MG Caps capsule Commonly known as: FLOMAX  Take 0.4 mg by mouth daily.   Tradjenta  5 MG Tabs tablet Generic drug: linagliptin  Take 5 mg by mouth daily.        Follow-up Information     Fernando Mould, MD Follow up.   Specialty: Family Medicine Why: GO: JUNE 4 AT 10:30AM Contact information: [address] [name] [address] [phone]                Discharge Exam: Cleavon Curls Weights   02/11/24 0638 02/12/24 0406 02/13/24 0403  Weight: 84.2 kg 84.9 kg 83.1 kg   BP 102/64 (BP Location: Right Arm)   Pulse (!) 102   Temp 98.2 F (36.8 C) (Oral)   Resp 20   Ht 5\' 6"  (1.676 m)   Wt 83.1 kg   SpO2 94%   BMI 29.57 kg/m    Patient is feeling better, no chest pain, no dyspnea, no lower extremity edema, PND or orthopnea  Neurology awake and alert ENT with mild pallor Cardiovascular with S1 and S2 present and regular with no gallops, rubs or murmurs No JVD Respiratory with no rales or wheezing, no rhonchi  Abdomen with no distention  No lower extremity edema   Condition at discharge: stable  The results of significant diagnostics from this hospitalization (including imaging, microbiology, ancillary and laboratory) are listed below for reference.   Imaging Studies: ECHOCARDIOGRAM COMPLETE Result Date: 02/10/2024    ECHOCARDIOGRAM REPORT   Patient Name:   PAITON FOSCO Mcglaughlin Date of Exam: 02/10/2024 Medical Rec #:  [id number]      Height:       66.0 in Accession #:    [id number]     Weight:       190.0 lb Date of Birth:  1946/04/05      BSA:          1.957 m Patient Age:    77 years       BP:           166/94 mmHg Patient Gender: M              HR:           76 bpm. Exam Location:  Inpatient Procedure: 2D Echo, Cardiac Doppler and Color Doppler (Both Spectral and Color            Flow Doppler were utilized during procedure). Indications:    Aortic Stenosis I35.0  History:        Patient has prior history of Echocardiogram examinations, most                  recent 09/12/2023. CHF, CAD, Prior CABG,  Signs/Symptoms:Shortness of Breath and Dyspnea; Risk                 Factors:Hypertension, Dyslipidemia and Diabetes.  Sonographer:    Kip Peon RDCS Referring Phys: [id number] [facility]  Sonographer Comments: Image acquisition challenging due to respiratory motion. IMPRESSIONS  1. Moderate septal hypertrophy and severe hypertrophy of the mid inferior myocardium (1.8 cm). Left ventricular ejection fraction, by estimation, is 60 to 65%. The left ventricle has normal function. The left ventricle has no regional wall motion abnormalities. There is moderate asymmetric left ventricular hypertrophy. Left ventricular diastolic parameters are consistent with Grade I diastolic dysfunction (impaired relaxation).  2. Right ventricular systolic function is normal. The right ventricular size is normal. There is normal pulmonary artery systolic pressure.  3. Left atrial size was mildly dilated.  4. The mitral valve is normal in structure. Trivial mitral valve regurgitation. No evidence of mitral stenosis.  5. The aortic valve is calcified. There is moderate calcification of the aortic valve. There is moderate thickening of the aortic valve. Aortic valve regurgitation is mild. No aortic stenosis is present.  6. The inferior vena cava is normal in size with greater than 50% respiratory variability, suggesting right atrial pressure of 3 mmHg. FINDINGS  Left Ventricle: Moderate septal hypertrophy and severe hypertrophy of the mid inferior myocardium (1.8 cm). Left ventricular ejection fraction, by estimation, is 60 to 65%. The left ventricle has normal function. The left ventricle has no regional wall motion abnormalities. The left ventricular internal cavity size was normal in size. There is moderate asymmetric left ventricular hypertrophy. Left ventricular diastolic parameters are consistent with Grade I diastolic dysfunction (impaired relaxation).  Indeterminate filling pressures. Right Ventricle: The right ventricular size is normal. No increase in right ventricular wall thickness. Right ventricular systolic function is normal. There is normal pulmonary artery systolic pressure. The tricuspid regurgitant velocity is 2.04 m/s, and  with an assumed right atrial pressure of 3 mmHg, the estimated right ventricular systolic pressure is 19.6 mmHg. Left Atrium: Left atrial size was mildly dilated. Right Atrium: Right atrial size was normal in size. Pericardium: There is no evidence of pericardial effusion. Mitral Valve: The mitral valve is normal in structure. Mild mitral annular calcification. Trivial mitral valve regurgitation. No evidence of mitral valve stenosis. Tricuspid Valve: The tricuspid valve is normal in structure. Tricuspid valve regurgitation is trivial. No evidence of tricuspid stenosis. Aortic Valve: The aortic valve is calcified. There is moderate calcification of the aortic valve. There is moderate thickening of the aortic valve. Aortic valve regurgitation is mild. No aortic stenosis is present. Aortic valve mean gradient measures 9.0  mmHg. Aortic valve peak gradient measures 18.0 mmHg. Aortic valve area, by VTI measures 1.12 cm. Pulmonic Valve: The pulmonic valve was normal in structure. Pulmonic valve regurgitation is not visualized. No evidence of pulmonic stenosis. Aorta: The aortic root is normal in size and structure. Venous: The inferior vena cava is normal in size with greater than 50% respiratory variability, suggesting right atrial pressure of 3 mmHg. IAS/Shunts: No atrial level shunt detected by color flow Doppler.  LEFT VENTRICLE PLAX 2D LVIDd:         4.40 cm   Diastology LVIDs:         2.60 cm   LV e' medial:    5.98 cm/s LV PW:         1.10 cm   LV E/e' medial:  11.2 LV IVS:        1.40 cm  LV e' lateral:   11.50 cm/s LVOT diam:     2.00 cm   LV E/e' lateral: 5.8 LV SV:         44 LV SV Index:   22 LVOT Area:     3.14 cm  RIGHT  VENTRICLE            IVC RV S prime:     9.36 cm/s  IVC diam: 1.30 cm TAPSE (M-mode): 1.2 cm LEFT ATRIUM             Index LA diam:        4.10 cm 2.10 cm/m LA Vol (A2C):   40.2 ml 20.55 ml/m LA Vol (A4C):   59.4 ml 30.36 ml/m LA Biplane Vol: 51.5 ml 26.32 ml/m  AORTIC VALVE AV Area (Vmax):    1.27 cm AV Area (Vmean):   1.35 cm AV Area (VTI):     1.12 cm AV Vmax:           212.00 cm/s AV Vmean:          136.000 cm/s AV VTI:            0.390 m AV Peak Grad:      18.0 mmHg AV Mean Grad:      9.0 mmHg LVOT Vmax:         85.60 cm/s LVOT Vmean:        58.300 cm/s LVOT VTI:          0.139 m LVOT/AV VTI ratio: 0.36  AORTA Ao Root diam: 3.20 cm Ao Asc diam:  3.60 cm MITRAL VALVE                TRICUSPID VALVE MV Area (PHT): 3.42 cm     TR Peak grad:   16.6 mmHg MV Decel Time: 222 msec     TR Vmax:        204.00 cm/s MV E velocity: 67.10 cm/s MV A velocity: 127.00 cm/s  SHUNTS MV E/A ratio:  0.53         Systemic VTI:  0.14 m                             Systemic Diam: 2.00 cm Maudine Sos MD Electronically signed by Maudine Sos MD Signature Date/Time: 02/10/2024/12:00:34 PM    Final    DG Abd 1 View Result Date: 02/10/2024 CLINICAL DATA:  Abdominal distension. EXAM: ABDOMEN - 1 VIEW COMPARISON:  January 08, 2023 FINDINGS: Partially aerated loops of large and small bowel are seen throughout the abdomen, with a moderate to large stool burden noted within the ascending colon. An adjacent air-filled loop of prominent small bowel is suspected within the right abdomen. No radio-opaque calculi or other significant radiographic abnormality are seen. An intact total right hip replacement is noted. IMPRESSION: 1. Moderate to large stool burden within the ascending colon, with additional findings that may represent sequelae associated a partial small bowel obstruction versus ileus. Abdomen and pelvis CT correlation is recommended. 2. Total right hip replacement. Electronically Signed   By: Virgle Grime M.D.   On:  02/10/2024 02:53   DG Chest 2 View Result Date: 02/09/2024 CLINICAL DATA:  Shortness of breath.  Chest pain. EXAM: CHEST - 2 VIEW COMPARISON:  09/28/2023 FINDINGS: Prior median sternotomy. Lung volumes are low. The heart is borderline enlarged. Mild vascular congestion. No pleural effusion or pneumothorax. Stable wedging of lower thoracic vertebra. IMPRESSION: Borderline  cardiomegaly with mild vascular congestion. Electronically Signed   By: Chadwick Colonel M.D.   On: 02/09/2024 22:21    Microbiology: Results for orders placed or performed during the hospital encounter of 02/26/23  SARS Coronavirus 2 by RT PCR (hospital order, performed in [facility] hospital lab) *cepheid single result test* Anterior Nasal Swab     Status: Abnormal   Collection Time: 02/26/23  4:02 PM   Specimen: Anterior Nasal Swab  Result Value Ref Range Status   SARS Coronavirus 2 by RT PCR POSITIVE (A) NEGATIVE Final    Comment: Performed at [facility], [address] [place], [name] [address]    Labs: CBC: Recent Labs  Lab 02/09/24 2204 02/10/24 0454  WBC 9.4 10.9*  HGB 12.3* 12.5*  HCT 38.8* 38.9*  MCV 87.4 85.7  PLT 225 233   Basic Metabolic Panel: Recent Labs  Lab 02/09/24 2204 02/10/24 0454 02/11/24 0854 02/12/24 0216 02/13/24 0245  NA 135 134* 133* 136 136  K 4.8 4.0 4.0 3.3* 3.8  CL 105 105 99 98 102  CO2 23 22 21* 24 23  GLUCOSE 279* 345* 459* 190* 206*  BUN 32* 31* 43* 53* 53*  CREATININE 2.03* 2.08* 2.65* 3.31* 2.92*  CALCIUM  8.7* 8.5* 8.9 8.4* 8.4*  MG  --  2.0 1.9 2.1  --   PHOS  --  3.0  --   --  2.1*   Liver Function Tests: Recent Labs  Lab 02/13/24 0245  ALBUMIN  3.1*   CBG: Recent Labs  Lab 02/12/24 0604 02/12/24 1105 02/12/24 1541 02/12/24 2133 02/13/24 0610  GLUCAP 146* 405* 240* 256* 246*    Discharge time spent: greater than 30 minutes.  Signed: Albertus Alt, MD [facility] 02/13/2024

## 2024-02-13 NOTE — TOC Transition Note (Signed)
 Transition of Care [facility] - Discharge Note   Patient Details  Name: Fernando Hess MRN: [id number] Date of Birth: 31-May-1946  [facility] CM/SW Contact:  Omie Bickers, RN Phone Number: 02/13/2024, 11:01 AM   Clinical Narrative:     Spoke w patient at bedside. He states that his wife will provide transportation home. No other needs identified.         Patient Goals and CMS Choice            Discharge Placement                       Discharge Plan and Services Additional resources added to the After Visit Summary for                                       Social Drivers of Health (SDOH) Interventions SDOH Screenings   Food Insecurity: No Food Insecurity (02/10/2024)  Housing: Low Risk  (02/10/2024)  Transportation Needs: No Transportation Needs (02/10/2024)  Utilities: Not At Risk (02/10/2024)  Depression (PHQ2-9): Low Risk  (05/20/2019)  Social Connections: Socially Isolated (02/10/2024)  Tobacco Use: Low Risk  (02/11/2024)     Readmission Risk Interventions    02/10/2024    3:26 PM  Readmission Risk Prevention Plan  Transportation Screening Complete  PCP or Specialist Appt within 3-5 Days Complete  HRI or Home Care Consult Complete  Palliative Care Screening Not Applicable  Medication Review (RN Care Manager) Complete

## 2024-02-13 NOTE — Plan of Care (Signed)
   Problem: Coping: Goal: Ability to adjust to condition or change in health will improve Outcome: Progressing   Problem: Fluid Volume: Goal: Ability to maintain a balanced intake and output will improve Outcome: Progressing

## 2024-02-15 NOTE — Telephone Encounter (Signed)
 Copied from CRM [id number]. Topic: General - Other >> Feb 12, 2024  8:50 AM Retta Caster wrote: Reason for CRM: Patient wife calling to request Dr. Adriane Albe could speak to patient he is in the hospital now due to diabetes not under control. She mention he has classes for this issue . Patient needs call back [phone]   Please refer to message above.  Patient is not establish at our office. Left message asking to give us  a call back since Dr. Adriane Albe is not listed as PCP and we are not accepting new patients.  Patient will need to call his PCP office with any questions, concerns, and/or education in reference to diabetes.

## 2024-02-22 NOTE — ED Provider Notes (Signed)
 [facility] 3E HF PCU Provider Note   CSN: [id number] Arrival date & time: 02/09/24  2132     History  Chief Complaint  Patient presents with   Shortness of Breath    Fernando Hess is a 78 y.o. male.  This is a 78 year old male presenting emergency department for shortness of breath.  History of myasthenia gravis, CHF.  Reports that he has had worsening shortness of breath over the past several days, but seemingly worsened acutely worsened this evening..  Subjective fevers at home.  No URI symptoms, no cough.   Shortness of Breath      Home Medications Prior to Admission medications   Medication Sig Start Date End Date Taking? Authorizing Provider  apixaban  (ELIQUIS ) 5 MG TABS tablet Take 1 tablet (5 mg total) by mouth 2 (two) times daily. 01/01/23  Yes Dahal, Aminta Baldy, MD  cyanocobalamin  (VITAMIN B12) 1000 MCG/ML injection Inject 1,000 mcg into the muscle once a week. saturdays   Yes [provider]  insulin  lispro (HUMALOG ) 100 UNIT/ML KwikPen Inject 10 Units into the skin 3 (three) times daily. 05/15/23  Yes [provider]  LANTUS  SOLOSTAR 100 UNIT/ML Solostar Pen Inject 40 Units into the skin daily. 02/19/23  Yes [provider]  pantoprazole  (PROTONIX ) 40 MG tablet TAKE 1 TABLET BY MOUTH EVERY DAY Patient taking differently: Take 40 mg by mouth in the morning. 11/25/19  Yes Tobin Forts, MD  QUEtiapine  (SEROQUEL ) 50 MG tablet Take 50 mg by mouth daily. 02/23/23  Yes [provider]  tamsulosin  (FLOMAX ) 0.4 MG CAPS capsule Take 0.4 mg by mouth daily. 01/10/24  Yes [provider]  TRADJENTA  5 MG TABS tablet Take 5 mg by mouth daily.   Yes [provider]  acetaminophen  (TYLENOL ) 325 MG tablet Take 2 tablets (650 mg total) by mouth every 6 (six) hours as needed for moderate pain (pain score 4-6) or mild pain (pain score 1-3). 02/13/24   Arrien, Curlee Doss, MD  Continuous Glucose Sensor (FREESTYLE LIBRE 2 SENSOR)  MISC Inject 1 Device into the skin every 14 (fourteen) days. 02/02/24   [provider]  furosemide  (LASIX ) 40 MG tablet Take 1 tablet (40 mg total) by mouth daily. 02/14/24   Arrien, Mauricio Daniel, MD  hydrALAZINE  (APRESOLINE ) 25 MG tablet Take 1 tablet (25 mg total) by mouth in the morning and at bedtime. 02/13/24   Arrien, Curlee Doss, MD  MYRBETRIQ  25 MG TB24 tablet Take 25 mg by mouth at bedtime. Patient not taking: Reported on 02/11/2024    [provider]  polyethylene glycol powder (GLYCOLAX /MIRALAX ) 17 GM/SCOOP powder Take 1 capful (17 g) and mix with 8 oz of water . Take mixture by mouth once daily. 02/13/24   Arrien, Curlee Doss, MD      Allergies    Cilostazol , Magnesium -containing compounds, Donepezil , Dulaglutide, Levofloxacin, Liraglutide, and Lisinopril    Review of Systems   Review of Systems  Respiratory:  Positive for shortness of breath.     Physical Exam Updated Vital Signs BP 102/64 (BP Location: Right Arm)   Pulse (!) 102   Temp 98.2 F (36.8 C) (Oral)   Resp 20   Ht 5\' 6"  (1.676 m)   Wt 83.1 kg   SpO2 94%   BMI 29.57 kg/m  Physical Exam Vitals and nursing note reviewed.  Constitutional:      General: He is not in acute distress.    Appearance: He is not toxic-appearing.  HENT:  Head: Normocephalic and atraumatic.  Eyes:     Extraocular Movements: Extraocular movements intact.     Pupils: Pupils are equal, round, and reactive to light.     Comments: No proptosis or ptosis  Cardiovascular:     Rate and Rhythm: Normal rate and regular rhythm.  Pulmonary:     Effort: Pulmonary effort is normal. No respiratory distress.     Breath sounds: Normal breath sounds. No decreased breath sounds or wheezing.     Comments: Able to count to 20 in a single breath Musculoskeletal:     Cervical back: Normal range of motion and neck supple.     Right lower leg: Edema present.     Left lower leg: Edema present.  Skin:    General: Skin is  warm.     Capillary Refill: Capillary refill takes less than 2 seconds.  Neurological:     Mental Status: He is alert.  Psychiatric:        Mood and Affect: Mood normal.        Behavior: Behavior normal.     ED Results / Procedures / Treatments   Labs (all labs ordered are listed, but only abnormal results are displayed) Labs Reviewed  CBC - Abnormal; Notable for the following components:      Result Value   Hemoglobin 12.3 (*)    HCT 38.8 (*)    All other components within normal limits  BASIC METABOLIC PANEL WITH GFR - Abnormal; Notable for the following components:   Glucose, Bld 279 (*)    BUN 32 (*)    Creatinine, Ser 2.03 (*)    Calcium  8.7 (*)    GFR, Estimated 33 (*)    All other components within normal limits  BRAIN NATRIURETIC PEPTIDE - Abnormal; Notable for the following components:   B Natriuretic Peptide 142.0 (*)    All other components within normal limits  BASIC METABOLIC PANEL WITH GFR - Abnormal; Notable for the following components:   Sodium 134 (*)    Glucose, Bld 345 (*)    BUN 31 (*)    Creatinine, Ser 2.08 (*)    Calcium  8.5 (*)    GFR, Estimated 32 (*)    All other components within normal limits  CBC - Abnormal; Notable for the following components:   WBC 10.9 (*)    Hemoglobin 12.5 (*)    HCT 38.9 (*)    All other components within normal limits  HEMOGLOBIN A1C - Abnormal; Notable for the following components:   Hgb A1c MFr Bld 10.8 (*)    All other components within normal limits  BASIC METABOLIC PANEL WITH GFR - Abnormal; Notable for the following components:   Sodium 133 (*)    CO2 21 (*)    Glucose, Bld 459 (*)    BUN 43 (*)    Creatinine, Ser 2.65 (*)    GFR, Estimated 24 (*)    All other components within normal limits  GLUCOSE, CAPILLARY - Abnormal; Notable for the following components:   Glucose-Capillary 292 (*)    All other components within normal limits  GLUCOSE, CAPILLARY - Abnormal; Notable for the following components:    Glucose-Capillary 276 (*)    All other components within normal limits  GLUCOSE, CAPILLARY - Abnormal; Notable for the following components:   Glucose-Capillary 406 (*)    All other components within normal limits  GLUCOSE, CAPILLARY - Abnormal; Notable for the following components:   Glucose-Capillary 405 (*)  All other components within normal limits  GLUCOSE, CAPILLARY - Abnormal; Notable for the following components:   Glucose-Capillary 316 (*)    All other components within normal limits  GLUCOSE, CAPILLARY - Abnormal; Notable for the following components:   Glucose-Capillary 257 (*)    All other components within normal limits  BASIC METABOLIC PANEL WITH GFR - Abnormal; Notable for the following components:   Potassium 3.3 (*)    Glucose, Bld 190 (*)    BUN 53 (*)    Creatinine, Ser 3.31 (*)    Calcium  8.4 (*)    GFR, Estimated 18 (*)    All other components within normal limits  GLUCOSE, CAPILLARY - Abnormal; Notable for the following components:   Glucose-Capillary 323 (*)    All other components within normal limits  GLUCOSE, CAPILLARY - Abnormal; Notable for the following components:   Glucose-Capillary 146 (*)    All other components within normal limits  GLUCOSE, CAPILLARY - Abnormal; Notable for the following components:   Glucose-Capillary 405 (*)    All other components within normal limits  GLUCOSE, CAPILLARY - Abnormal; Notable for the following components:   Glucose-Capillary 240 (*)    All other components within normal limits  RENAL FUNCTION PANEL - Abnormal; Notable for the following components:   Glucose, Bld 206 (*)    BUN 53 (*)    Creatinine, Ser 2.92 (*)    Calcium  8.4 (*)    Phosphorus 2.1 (*)    Albumin  3.1 (*)    GFR, Estimated 21 (*)    All other components within normal limits  GLUCOSE, CAPILLARY - Abnormal; Notable for the following components:   Glucose-Capillary 256 (*)    All other components within normal limits  GLUCOSE,  CAPILLARY - Abnormal; Notable for the following components:   Glucose-Capillary 246 (*)    All other components within normal limits  CBG MONITORING, ED - Abnormal; Notable for the following components:   Glucose-Capillary 279 (*)    All other components within normal limits  CBG MONITORING, ED - Abnormal; Notable for the following components:   Glucose-Capillary 274 (*)    All other components within normal limits  D-DIMER, QUANTITATIVE  MAGNESIUM   PHOSPHORUS  MAGNESIUM   MAGNESIUM   TROPONIN I (HIGH SENSITIVITY)  TROPONIN I (HIGH SENSITIVITY)    EKG EKG Interpretation Date/Time:  Tuesday Feb 09 2024 21:47:16 EDT Ventricular Rate:  81 PR Interval:  204 QRS Duration:  91 QT Interval:  398 QTC Calculation: 462 R Axis:   24  Text Interpretation: Sinus or ectopic atrial rhythm Confirmed by Elise Guile [phone]) on 02/09/2024 10:59:43 PM  Radiology No results found.  Procedures Procedures    Medications Ordered in ED Medications  furosemide  (LASIX ) injection 40 mg (40 mg Intravenous Given 02/10/24 0117)  furosemide  (LASIX ) injection 20 mg (20 mg Intravenous Given 02/10/24 1125)  potassium chloride  SA (KLOR-CON  M) CR tablet 20 mEq (20 mEq Oral Given 02/12/24 0820)    ED Course/ Medical Decision Making/ A&P                                 Medical Decision Making This is a 78 year old male complex past medical history presenting emergency department with shortness of breath.  Seemingly worse with exertion.  His last echo in 2024 with normal ejection fraction, but some diastolic dysfunction.  Clinically well-appearing does not appear to be in respiratory distress.  He does have reported  history of myasthenia gravis, but he is not having ptosis and is able to count to 20 and single breath.  He does note some generalized weakness that he attributes to his myasthenia gravis however.  Cardiac workup reassuring; ACS unlikely.  Minimally elevated BNP with a chest x-ray with some vascular  congestion.  D-dimer pending and signed out to afternoon team.  Disposition pending further workup, but anticipate admission.  Amount and/or Complexity of Data Reviewed Labs: ordered. Radiology: ordered. ECG/medicine tests: ordered.  Risk Prescription drug management. Decision regarding hospitalization.          Final Clinical Impression(s) / ED Diagnoses Final diagnoses:  Dyspnea, unspecified type    Rx / DC Orders ED Discharge Orders          Ordered    furosemide  (LASIX ) 40 MG tablet  Daily        02/13/24 1034    hydrALAZINE  (APRESOLINE ) 25 MG tablet  2 times daily        02/13/24 1034    Increase activity slowly        02/13/24 1034    Diet - low sodium heart healthy        02/13/24 1034    Discharge instructions       Comments: Please follow up with primary care in 7 to 10 days.   02/13/24 1034    acetaminophen  (TYLENOL ) 325 MG tablet  Every 6 hours PRN        02/13/24 1034    polyethylene glycol powder (GLYCOLAX /MIRALAX ) 17 GM/SCOOP powder  Daily        02/13/24 1111              Rolinda Climes, DO 02/22/24 [phone]

## 2024-02-23 ENCOUNTER — Emergency Department (HOSPITAL_COMMUNITY)
Admission: EM | Admit: 2024-02-23 | Discharge: 2024-02-24 | Disposition: A | Discharge status: Home / Self Care | Attending: Emergency Medicine | Admitting: Emergency Medicine

## 2024-02-23 ENCOUNTER — Emergency Department (HOSPITAL_COMMUNITY)

## 2024-02-23 ENCOUNTER — Encounter (HOSPITAL_COMMUNITY): Payer: Self-pay | Admitting: Emergency Medicine

## 2024-02-23 ENCOUNTER — Other Ambulatory Visit: Payer: Self-pay

## 2024-02-23 DIAGNOSIS — I130 Hypertensive heart and chronic kidney disease with heart failure and stage 1 through stage 4 chronic kidney disease, or unspecified chronic kidney disease: Secondary | ICD-10-CM | POA: Insufficient documentation

## 2024-02-23 DIAGNOSIS — Z951 Presence of aortocoronary bypass graft: Secondary | ICD-10-CM | POA: Insufficient documentation

## 2024-02-23 DIAGNOSIS — E1122 Type 2 diabetes mellitus with diabetic chronic kidney disease: Secondary | ICD-10-CM | POA: Insufficient documentation

## 2024-02-23 DIAGNOSIS — Z7901 Long term (current) use of anticoagulants: Secondary | ICD-10-CM | POA: Diagnosis not present

## 2024-02-23 DIAGNOSIS — R7989 Other specified abnormal findings of blood chemistry: Secondary | ICD-10-CM | POA: Insufficient documentation

## 2024-02-23 DIAGNOSIS — D631 Anemia in chronic kidney disease: Secondary | ICD-10-CM | POA: Insufficient documentation

## 2024-02-23 DIAGNOSIS — R0602 Shortness of breath: Secondary | ICD-10-CM | POA: Diagnosis present

## 2024-02-23 DIAGNOSIS — Z794 Long term (current) use of insulin: Secondary | ICD-10-CM | POA: Diagnosis not present

## 2024-02-23 DIAGNOSIS — D72829 Elevated white blood cell count, unspecified: Secondary | ICD-10-CM | POA: Insufficient documentation

## 2024-02-23 DIAGNOSIS — N189 Chronic kidney disease, unspecified: Secondary | ICD-10-CM | POA: Insufficient documentation

## 2024-02-23 DIAGNOSIS — I509 Heart failure, unspecified: Secondary | ICD-10-CM | POA: Insufficient documentation

## 2024-02-23 DIAGNOSIS — Z79899 Other long term (current) drug therapy: Secondary | ICD-10-CM | POA: Insufficient documentation

## 2024-02-23 LAB — CBC WITH DIFFERENTIAL/PLATELET
Abs Immature Granulocytes: 0.09 10*3/uL — ABNORMAL HIGH (ref 0.00–0.07)
Basophils Absolute: 0.1 10*3/uL (ref 0.0–0.1)
Basophils Relative: 1 %
Eosinophils Absolute: 0.2 10*3/uL (ref 0.0–0.5)
Eosinophils Relative: 2 %
HCT: 39.2 % (ref 39.0–52.0)
Hemoglobin: 12.5 g/dL — ABNORMAL LOW (ref 13.0–17.0)
Immature Granulocytes: 1 %
Lymphocytes Relative: 12 %
Lymphs Abs: 1.5 10*3/uL (ref 0.7–4.0)
MCH: 27.5 pg (ref 26.0–34.0)
MCHC: 31.9 g/dL (ref 30.0–36.0)
MCV: 86.3 fL (ref 80.0–100.0)
Monocytes Absolute: 1.2 10*3/uL — ABNORMAL HIGH (ref 0.1–1.0)
Monocytes Relative: 10 %
Neutro Abs: 8.6 10*3/uL — ABNORMAL HIGH (ref 1.7–7.7)
Neutrophils Relative %: 74 %
Platelets: 263 10*3/uL (ref 150–400)
RBC: 4.54 MIL/uL (ref 4.22–5.81)
RDW: 14.6 % (ref 11.5–15.5)
WBC: 11.7 10*3/uL — ABNORMAL HIGH (ref 4.0–10.5)
nRBC: 0 % (ref 0.0–0.2)

## 2024-02-23 LAB — I-STAT VENOUS BLOOD GAS, ED
Acid-base deficit: 3 mmol/L — ABNORMAL HIGH (ref 0.0–2.0)
Bicarbonate: 21.8 mmol/L (ref 20.0–28.0)
Calcium, Ion: 1.2 mmol/L (ref 1.15–1.40)
HCT: 37 % — ABNORMAL LOW (ref 39.0–52.0)
Hemoglobin: 12.6 g/dL — ABNORMAL LOW (ref 13.0–17.0)
O2 Saturation: 81 %
Potassium: 4.1 mmol/L (ref 3.5–5.1)
Sodium: 141 mmol/L (ref 135–145)
TCO2: 23 mmol/L (ref 22–32)
pCO2, Ven: 35.4 mmHg — ABNORMAL LOW (ref 44–60)
pH, Ven: 7.397 (ref 7.25–7.43)
pO2, Ven: 45 mmHg (ref 32–45)

## 2024-02-23 LAB — TROPONIN I (HIGH SENSITIVITY): Troponin I (High Sensitivity): 11 ng/L (ref <18)

## 2024-02-23 LAB — COMPREHENSIVE METABOLIC PANEL WITH GFR
ALT: 13 U/L (ref 0–44)
AST: 23 U/L (ref 15–41)
Albumin: 3.2 g/dL — ABNORMAL LOW (ref 3.5–5.0)
Alkaline Phosphatase: 101 U/L (ref 38–126)
Anion gap: 9 (ref 5–15)
BUN: 35 mg/dL — ABNORMAL HIGH (ref 8–23)
CO2: 22 mmol/L (ref 22–32)
Calcium: 9.2 mg/dL (ref 8.9–10.3)
Chloride: 108 mmol/L (ref 98–111)
Creatinine, Ser: 2.73 mg/dL — ABNORMAL HIGH (ref 0.61–1.24)
GFR, Estimated: 23 mL/min — ABNORMAL LOW (ref >60)
Glucose, Bld: 98 mg/dL (ref 70–99)
Potassium: 4.2 mmol/L (ref 3.5–5.1)
Sodium: 139 mmol/L (ref 135–145)
Total Bilirubin: 0.7 mg/dL (ref 0.0–1.2)
Total Protein: 6.1 g/dL — ABNORMAL LOW (ref 6.5–8.1)

## 2024-02-23 LAB — BRAIN NATRIURETIC PEPTIDE: B Natriuretic Peptide: 164.9 pg/mL — ABNORMAL HIGH (ref 0.0–100.0)

## 2024-02-23 LAB — CBG MONITORING, ED: Glucose-Capillary: 91 mg/dL (ref 70–99)

## 2024-02-23 NOTE — ED Provider Notes (Incomplete)
 [place] EMERGENCY DEPARTMENT AT [facility] Provider Note   CSN: [id number] Arrival date & time: 02/23/24  2225     History {Add pertinent medical, surgical, social history, OB history to HPI:1} Chief Complaint  Patient presents with  . Shortness of Breath    Fernando Hess is a 78 y.o. male past medical history significant for hypertension, CABG, CHF, A-fib, CKD, diabetes, encephalopathy, OSA, and GERD presents today for shortness of breath that started today.  Patient denies cough, congestion, swelling, CP, N/V/D, abdominal pain, or fever.    Shortness of Breath      Home Medications Prior to Admission medications   Medication Sig Start Date End Date Taking? Authorizing Provider  acetaminophen  (TYLENOL ) 325 MG tablet Take 2 tablets (650 mg total) by mouth every 6 (six) hours as needed for moderate pain (pain score 4-6) or mild pain (pain score 1-3). 02/13/24   Arrien, Curlee Doss, MD  apixaban  (ELIQUIS ) 5 MG TABS tablet Take 1 tablet (5 mg total) by mouth 2 (two) times daily. 01/01/23   Hoyt Macleod, MD  Continuous Glucose Sensor (FREESTYLE LIBRE 2 SENSOR) MISC Inject 1 Device into the skin every 14 (fourteen) days. 02/02/24   [provider]  cyanocobalamin  (VITAMIN B12) 1000 MCG/ML injection Inject 1,000 mcg into the muscle once a week. saturdays    [provider]  furosemide  (LASIX ) 40 MG tablet Take 1 tablet (40 mg total) by mouth daily. 02/14/24   Arrien, Mauricio Daniel, MD  hydrALAZINE  (APRESOLINE ) 25 MG tablet Take 1 tablet (25 mg total) by mouth in the morning and at bedtime. 02/13/24   Arrien, Curlee Doss, MD  insulin  lispro (HUMALOG ) 100 UNIT/ML KwikPen Inject 10 Units into the skin 3 (three) times daily. 05/15/23   [provider]  LANTUS  SOLOSTAR 100 UNIT/ML Solostar Pen Inject 40 Units into the skin daily. 02/19/23   [provider]  MYRBETRIQ  25 MG TB24 tablet Take 25 mg by mouth at bedtime. Patient not taking:  Reported on 02/11/2024    [provider]  pantoprazole  (PROTONIX ) 40 MG tablet TAKE 1 TABLET BY MOUTH EVERY DAY Patient taking differently: Take 40 mg by mouth in the morning. 11/25/19   Tobin Forts, MD  polyethylene glycol powder (GLYCOLAX /MIRALAX ) 17 GM/SCOOP powder Take 1 capful (17 g) and mix with 8 oz of water . Take mixture by mouth once daily. 02/13/24   Arrien, Mauricio Daniel, MD  QUEtiapine  (SEROQUEL ) 50 MG tablet Take 50 mg by mouth daily. 02/23/23   [provider]  tamsulosin  (FLOMAX ) 0.4 MG CAPS capsule Take 0.4 mg by mouth daily. 01/10/24   [provider]  TRADJENTA  5 MG TABS tablet Take 5 mg by mouth daily.    [provider]      Allergies    Cilostazol , Magnesium -containing compounds, Donepezil , Dulaglutide, Levofloxacin, Liraglutide, and Lisinopril    Review of Systems   Review of Systems  Respiratory:  Positive for shortness of breath.     Physical Exam Updated Vital Signs There were no vitals taken for this visit. Physical Exam Vitals and nursing note reviewed.  Constitutional:      General: He is not in acute distress.    Appearance: He is well-developed.  HENT:     Head: Normocephalic and atraumatic.  Eyes:     Conjunctiva/sclera: Conjunctivae normal.  Cardiovascular:     Rate and Rhythm: Normal rate and regular rhythm.     Heart sounds: No murmur heard. Pulmonary:  Effort: Pulmonary effort is normal. No respiratory distress.     Breath sounds: Normal breath sounds.  Abdominal:     Palpations: Abdomen is soft.     Tenderness: There is no abdominal tenderness.  Musculoskeletal:        General: No swelling.     Cervical back: Neck supple.  Skin:    General: Skin is warm and dry.     Capillary Refill: Capillary refill takes less than 2 seconds.  Neurological:     Mental Status: He is alert.  Psychiatric:        Mood and Affect: Mood normal.     ED Results / Procedures / Treatments   Labs (all labs ordered  are listed, but only abnormal results are displayed) Labs Reviewed - No data to display  EKG None  Radiology No results found.  Procedures Procedures  {Document cardiac monitor, telemetry assessment procedure when appropriate:1}  Medications Ordered in ED Medications - No data to display  ED Course/ Medical Decision Making/ A&P   {   Click here for ABCD2, HEART and other calculatorsREFRESH Note before signing :1}                              Medical Decision Making  This patient presents to the ED for concern of shortness of breath, this involves an extensive number of treatment options, and is a complaint that carries with it a high risk of complications and morbidity.  The differential diagnosis includes arrhythmia, STEMI, NSTEMI, CHF exacerbation, pneumonia   Co morbidities / Chronic conditions that complicate the patient evaluation  hypertension, CABG, CHF, A-fib, CKD, diabetes, encephalopathy, OSA, and GERD   Additional history obtained:  Additional history obtained from EMR External records from outside source obtained and reviewed including previous admission documents   Lab Tests:  I Ordered, and personally interpreted labs.  The pertinent results include: Leukocytosis at 11.7 without left shift, mild anemia at 12.5, CBG 91, elevated bun at 35, elevated creatinine at 2.73 consistent with CKD, mildly decreased albumin  at 3.6, total protein at 6.1, BNP 164.9, troponin 11   Imaging Studies ordered:  I ordered imaging studies including chest x-ray I independently visualized and interpreted imaging which showed no active cardiopulmonary disease I agree with the radiologist interpretation   Cardiac Monitoring: / EKG:  The patient was maintained on a cardiac monitor.  I personally viewed and interpreted the cardiac monitored which showed an underlying rhythm of: A-fib, RBBB   Problem List / ED Course / Critical interventions / Medication management  I ordered  medication including ***   Reevaluation of the patient after these medicines showed that the patient *** I have reviewed the patients home medicines and have made adjustments as needed   Consultations Obtained:  I requested consultation with the ***,  and discussed lab and imaging findings as well as pertinent plan - they recommend: ***   Test / Admission - Considered:  ***   {Document critical care time when appropriate:1} {Document review of labs and clinical decision tools ie heart score, Chads2Vasc2 etc:1}  {Document your independent review of radiology images, and any outside records:1} {Document your discussion with family members, caretakers, and with consultants:1} {Document social determinants of health affecting pt's care:1} {Document your decision making why or why not admission, treatments were needed:1} Final Clinical Impression(s) / ED Diagnoses Final diagnoses:  None    Rx / DC Orders ED Discharge Orders  None

## 2024-02-23 NOTE — ED Provider Notes (Signed)
 [place] EMERGENC[facility] DEPARTMENT AT [facility] Provider Note   CSN: [id number] Arrival date & time: 02/23/24  2225     History  Chief Complaint  Patient presents with   Shortness of Breath    Fernando Hess is a 78 y.o. male past medical history significant for hypertension, CABG, CHF, A-fib, CKD, diabetes, encephalopathy, OSA, and GERD presents today for shortness of breath that started today.  Patient denies cough, congestion, swelling, CP, N/V/D, abdominal pain, or fever.    Shortness of Breath      Home Medications Prior to Admission medications   Medication Sig Start Date End Date Taking? Authorizing Provider  acetaminophen  (TYLENOL ) 325 MG tablet Take 2 tablets (650 mg total) by mouth every 6 (six) hours as needed for moderate pain (pain score 4-6) or mild pain (pain score 1-3). 02/13/24   Arrien, Curlee Doss, MD  apixaban  (ELIQUIS ) 5 MG TABS tablet Take 1 tablet (5 mg total) by mouth 2 (two) times daily. 01/01/23   Hoyt Macleod, MD  Continuous Glucose Sensor (FREESTYLE LIBRE 2 SENSOR) MISC Inject 1 Device into the skin every 14 (fourteen) days. 02/02/24   [provider]  cyanocobalamin  (VITAMIN B12) 1000 MCG/ML injection Inject 1,000 mcg into the muscle once a week. saturdays    [provider]  furosemide  (LASIX ) 40 MG tablet Take 1 tablet (40 mg total) by mouth daily. 02/14/24   Arrien, Curlee Doss, MD  hydrALAZINE  (APRESOLINE ) 25 MG tablet Take 1 tablet (25 mg total) by mouth in the morning and at bedtime. 02/13/24   Arrien, Curlee Doss, MD  insulin  lispro (HUMALOG ) 100 UNIT/ML KwikPen Inject 10 Units into the skin 3 (three) times daily. 05/15/23   [provider]  LANTUS  SOLOSTAR 100 UNIT/ML Solostar Pen Inject 40 Units into the skin daily. 02/19/23   [provider]  MYRBETRIQ  25 MG TB24 tablet Take 25 mg by mouth at bedtime. Patient not taking: Reported on 02/11/2024    [provider]  pantoprazole  (PROTONIX )  40 MG tablet TAKE 1 TABLET BY MOUTH EVERY DAY Patient taking differently: Take 40 mg by mouth in the morning. 11/25/19   Tobin Forts, MD  polyethylene glycol powder (GLYCOLAX /MIRALAX ) 17 GM/SCOOP powder Take 1 capful (17 g) and mix with 8 oz of water . Take mixture by mouth once daily. 02/13/24   Arrien, Mauricio Daniel, MD  QUEtiapine  (SEROQUEL ) 50 MG tablet Take 50 mg by mouth daily. 02/23/23   [provider]  tamsulosin  (FLOMAX ) 0.4 MG CAPS capsule Take 0.4 mg by mouth daily. 01/10/24   [provider]  TRADJENTA  5 MG TABS tablet Take 5 mg by mouth daily.    [provider]      Allergies    Cilostazol , Magnesium -containing compounds, Donepezil , Dulaglutide, Levofloxacin, Liraglutide, and Lisinopril    Review of Systems   Review of Systems  Respiratory:  Positive for shortness of breath.     Physical Exam Updated Vital Signs BP 139/77   Pulse 73   Temp 98.3 F (36.8 C) (Oral)   Resp 15   Ht 5' 6 (1.676 m)   Wt 83.1 kg   SpO2 100%   BMI 29.57 kg/m  Physical Exam Vitals and nursing note reviewed.  Constitutional:      General: He is not in acute distress.    Appearance: He is well-developed.  HENT:     Head: Normocephalic and atraumatic.  Eyes:     Conjunctiva/sclera: Conjunctivae normal.  Cardiovascular:  Rate and Rhythm: Normal rate and regular rhythm.     Heart sounds: No murmur heard. Pulmonary:     Effort: Pulmonary effort is normal. No respiratory distress.     Breath sounds: Normal breath sounds.  Abdominal:     Palpations: Abdomen is soft.     Tenderness: There is no abdominal tenderness.  Musculoskeletal:        General: No swelling.     Cervical back: Neck supple.  Skin:    General: Skin is warm and dry.     Capillary Refill: Capillary refill takes less than 2 seconds.  Neurological:     Mental Status: He is alert.  Psychiatric:        Mood and Affect: Mood normal.     ED Results / Procedures / Treatments   Labs (all  labs ordered are listed, but only abnormal results are displayed) Labs Reviewed  COMPREHENSIVE METABOLIC PANEL WITH GFR - Abnormal; Notable for the following components:      Result Value   BUN 35 (*)    Creatinine, Ser 2.73 (*)    Total Protein 6.1 (*)    Albumin  3.2 (*)    GFR, Estimated 23 (*)    All other components within normal limits  CBC WITH DIFFERENTIAL/PLATELET - Abnormal; Notable for the following components:   WBC 11.7 (*)    Hemoglobin 12.5 (*)    Neutro Abs 8.6 (*)    Monocytes Absolute 1.2 (*)    Abs Immature Granulocytes 0.09 (*)    All other components within normal limits  BRAIN NATRIURETIC PEPTIDE - Abnormal; Notable for the following components:   B Natriuretic Peptide 164.9 (*)    All other components within normal limits  I-STAT VENOUS BLOOD GAS, ED - Abnormal; Notable for the following components:   pCO2, Ven 35.4 (*)    Acid-base deficit 3.0 (*)    HCT 37.0 (*)    Hemoglobin 12.6 (*)    All other components within normal limits  URINALYSIS, ROUTINE W REFLEX MICROSCOPIC  CBG MONITORING, ED  TROPONIN I (HIGH SENSITIVITY)  TROPONIN I (HIGH SENSITIVITY)    EKG EKG Interpretation Date/Time:  Tuesday February 23 2024 22:30:15 EDT Ventricular Rate:  85 PR Interval:  210 QRS Duration:  136 QT Interval:  409 QTC Calculation: 487 R Axis:   39  Text Interpretation: Atrial fibrillation Right bundle branch block Confirmed by Annita Kindle [id number]) on 02/23/2024 10:33:56 PM  Radiology DG Chest 2 View Result Date: 02/23/2024 CLINICAL DATA:  Shortness of breath EXAM: CHEST - 2 VIEW COMPARISON:  02/09/2024 FINDINGS: Prior CABG. Heart and mediastinal contours are within normal limits. No focal opacities or effusions. No acute bony abnormality. IMPRESSION: No active cardiopulmonary disease. Electronically Signed   By: Janeece Mechanic M.D.   On: 02/23/2024 23:15    Procedures Procedures    Medications Ordered in ED Medications  furosemide  (LASIX ) injection 40 mg (40  mg Intravenous Given 02/24/24 0127)    ED Course/ Medical Decision Making/ A&P                                 Medical Decision Making Amount and/or Complexity of Data Reviewed Labs: ordered. Radiology: ordered.  Risk Prescription drug management.   This patient presents to the ED for concern of shortness of breath, this involves an extensive number of treatment options, and is a complaint that carries with it a high risk of  complications and morbidity.  The differential diagnosis includes arrhythmia, STEMI, NSTEMI, CHF exacerbation, pneumonia   Co morbidities / Chronic conditions that complicate the patient evaluation  hypertension, CABG, CHF, A-fib, CKD, diabetes, encephalopathy, OSA, and GERD   Additional history obtained:  Additional history obtained from EMR External records from outside source obtained and reviewed including previous admission documents   Lab Tests:  I Ordered, and personally interpreted labs.  The pertinent results include: Leukocytosis at 11.7 without left shift, mild anemia at 12.5, CBG 91, elevated bun at 35, elevated creatinine at 2.73 consistent with CKD, mildly decreased albumin  at 3.6, total protein at 6.1, BNP 164.9, troponin 11   Imaging Studies ordered:  I ordered imaging studies including chest x-ray I independently visualized and interpreted imaging which showed no active cardiopulmonary disease I agree with the radiologist interpretation   Cardiac Monitoring: / EKG:  The patient was maintained on a cardiac monitor.  I personally viewed and interpreted the cardiac monitored which showed an underlying rhythm of: A-fib, RBBB   Problem List / ED Course / Critical interventions / Medication management  I ordered medication including lasix    Reevaluation of the patient after these medicines showed that the patient stayed the same I have reviewed the patients home medicines and have made adjustments as needed Patient able to ambulate on  room air and maintain pulse ox of 95% or greater.   Test / Admission - Considered:  Considered for admission further workup patient's vital signs, physical exam, labs, and imaging are reassuring.  Patient is likely mildly fluid overloaded.  Patient given IV Lasix  while in ED.  Patient instructed to double his home Lasix  for the next 3 days and to follow-up with his primary care.  Patient given return precautions.  I feel patient is safe for discharge at this time.        Final Clinical Impression(s) / ED Diagnoses Final diagnoses:  SOB (shortness of breath)    Rx / DC Orders ED Discharge Orders     None         Carie Charity, [name] 02/24/24 0216    Onetha Bile, MD 02/24/24 [phone]

## 2024-02-23 NOTE — ED Triage Notes (Signed)
 Pt BIB EMS from home with c/o SHOB. Hx of afib.initial cbg at home was 67, wife fed the pt, cbg 92 with ems. 97% on RA with increased WOB, 2lpm [place] started with ems for comfort.

## 2024-02-24 LAB — TROPONIN I (HIGH SENSITIVITY): Troponin I (High Sensitivity): 13 ng/L (ref <18)

## 2024-02-24 MED ORDER — FUROSEMIDE 10 MG/ML IJ SOLN
40.0000 mg | Freq: Once | INTRAMUSCULAR | Status: AC
  Administered 2024-02-24: 40 mg via INTRAVENOUS
  Filled 2024-02-24: qty 4

## 2024-02-24 NOTE — Progress Notes (Signed)
 Pt. NIF >-40 and FVC 1.10 with good effort.

## 2024-02-24 NOTE — Discharge Instructions (Addendum)
 Today you were seen for shortness of breath.  Please double your dose of Lasix  for the next 3 days.  Please return to the ED if you have worsening shortness of breath or chest pain.  Thank you for letting us  treat you today. After reviewing your labs and imaging, I feel you are safe to go home. Please follow up with your PCP in the next several days and provide them with your records from this visit. Return to the Emergency Room if pain becomes severe or symptoms worsen.

## 2024-02-24 NOTE — ED Notes (Signed)
 Left voicemail for wife to pick up patient.

## 2024-02-25 NOTE — Telephone Encounter (Signed)
 Please see the message below as the patient is requesting a call back.  Copied from CRM [id number]. Topic: General - Other >> Feb 25, 2024  9:55 AM Carrielelia G wrote: Attn: Curt Dover  Patient wife calling, for husband to be a new patient. Wife was told they were not taking on new patients at this time Sabra Cramp, Santee L confirmed that when I called office)    Mrs Gary would still like for Charsetta to call her back.   Please advise.

## 2024-02-25 NOTE — Telephone Encounter (Signed)
 Left VM for patient that we are not accepting any new patients at this time.  We are a Residency and when we start accepting new patients he would not be assigned to Dr. Adriane Albe but to one of our Residents. Should he call back he can be transferred to the office to speak with Arzella Laurence, Research officer, political party.

## 2024-02-25 NOTE — Telephone Encounter (Signed)
 Message forwarded to [facility], Research officer, political party, to contact patient since he is already aware that we are not accepting new patients. Any override would have to be made by her or the medical director.

## 2024-03-01 ENCOUNTER — Observation Stay (HOSPITAL_COMMUNITY)
Admission: EM | Admit: 2024-03-01 | Discharge: 2024-03-04 | Disposition: A | Discharge status: Home / Self Care | Attending: Emergency Medicine | Admitting: Emergency Medicine

## 2024-03-01 ENCOUNTER — Other Ambulatory Visit: Payer: Self-pay

## 2024-03-01 ENCOUNTER — Emergency Department (HOSPITAL_COMMUNITY)

## 2024-03-01 DIAGNOSIS — E785 Hyperlipidemia, unspecified: Secondary | ICD-10-CM | POA: Diagnosis not present

## 2024-03-01 DIAGNOSIS — I739 Peripheral vascular disease, unspecified: Secondary | ICD-10-CM | POA: Diagnosis not present

## 2024-03-01 DIAGNOSIS — G4736 Sleep related hypoventilation in conditions classified elsewhere: Secondary | ICD-10-CM | POA: Insufficient documentation

## 2024-03-01 DIAGNOSIS — I2510 Atherosclerotic heart disease of native coronary artery without angina pectoris: Secondary | ICD-10-CM | POA: Diagnosis not present

## 2024-03-01 DIAGNOSIS — I5032 Chronic diastolic (congestive) heart failure: Secondary | ICD-10-CM | POA: Diagnosis not present

## 2024-03-01 DIAGNOSIS — Z79899 Other long term (current) drug therapy: Secondary | ICD-10-CM | POA: Insufficient documentation

## 2024-03-01 DIAGNOSIS — G4733 Obstructive sleep apnea (adult) (pediatric): Secondary | ICD-10-CM | POA: Diagnosis not present

## 2024-03-01 DIAGNOSIS — F418 Other specified anxiety disorders: Secondary | ICD-10-CM | POA: Diagnosis present

## 2024-03-01 DIAGNOSIS — Z8669 Personal history of other diseases of the nervous system and sense organs: Secondary | ICD-10-CM

## 2024-03-01 DIAGNOSIS — Z951 Presence of aortocoronary bypass graft: Secondary | ICD-10-CM | POA: Insufficient documentation

## 2024-03-01 DIAGNOSIS — E119 Type 2 diabetes mellitus without complications: Secondary | ICD-10-CM | POA: Diagnosis present

## 2024-03-01 DIAGNOSIS — E113392 Type 2 diabetes mellitus with moderate nonproliferative diabetic retinopathy without macular edema, left eye: Secondary | ICD-10-CM | POA: Insufficient documentation

## 2024-03-01 DIAGNOSIS — I130 Hypertensive heart and chronic kidney disease with heart failure and stage 1 through stage 4 chronic kidney disease, or unspecified chronic kidney disease: Secondary | ICD-10-CM | POA: Diagnosis not present

## 2024-03-01 DIAGNOSIS — N1832 Chronic kidney disease, stage 3b: Secondary | ICD-10-CM | POA: Diagnosis not present

## 2024-03-01 DIAGNOSIS — I5030 Unspecified diastolic (congestive) heart failure: Secondary | ICD-10-CM | POA: Diagnosis present

## 2024-03-01 DIAGNOSIS — F0390 Unspecified dementia without behavioral disturbance: Secondary | ICD-10-CM | POA: Diagnosis not present

## 2024-03-01 DIAGNOSIS — E1122 Type 2 diabetes mellitus with diabetic chronic kidney disease: Secondary | ICD-10-CM | POA: Insufficient documentation

## 2024-03-01 DIAGNOSIS — G4734 Idiopathic sleep related nonobstructive alveolar hypoventilation: Secondary | ICD-10-CM

## 2024-03-01 DIAGNOSIS — E1169 Type 2 diabetes mellitus with other specified complication: Secondary | ICD-10-CM | POA: Diagnosis present

## 2024-03-01 DIAGNOSIS — F419 Anxiety disorder, unspecified: Secondary | ICD-10-CM | POA: Insufficient documentation

## 2024-03-01 DIAGNOSIS — F32A Depression, unspecified: Secondary | ICD-10-CM | POA: Insufficient documentation

## 2024-03-01 DIAGNOSIS — E1140 Type 2 diabetes mellitus with diabetic neuropathy, unspecified: Secondary | ICD-10-CM | POA: Diagnosis not present

## 2024-03-01 DIAGNOSIS — Z7901 Long term (current) use of anticoagulants: Secondary | ICD-10-CM | POA: Insufficient documentation

## 2024-03-01 DIAGNOSIS — R0602 Shortness of breath: Secondary | ICD-10-CM | POA: Diagnosis present

## 2024-03-01 DIAGNOSIS — I480 Paroxysmal atrial fibrillation: Secondary | ICD-10-CM | POA: Diagnosis present

## 2024-03-01 DIAGNOSIS — I10 Essential (primary) hypertension: Secondary | ICD-10-CM | POA: Diagnosis present

## 2024-03-01 DIAGNOSIS — Z992 Dependence on renal dialysis: Secondary | ICD-10-CM | POA: Diagnosis not present

## 2024-03-01 LAB — BASIC METABOLIC PANEL WITH GFR
Anion gap: 10 (ref 5–15)
BUN: 30 mg/dL — ABNORMAL HIGH (ref 8–23)
CO2: 21 mmol/L — ABNORMAL LOW (ref 22–32)
Calcium: 8.8 mg/dL — ABNORMAL LOW (ref 8.9–10.3)
Chloride: 107 mmol/L (ref 98–111)
Creatinine, Ser: 2.34 mg/dL — ABNORMAL HIGH (ref 0.61–1.24)
GFR, Estimated: 28 mL/min — ABNORMAL LOW (ref >60)
Glucose, Bld: 174 mg/dL — ABNORMAL HIGH (ref 70–99)
Potassium: 3.8 mmol/L (ref 3.5–5.1)
Sodium: 138 mmol/L (ref 135–145)

## 2024-03-01 LAB — CBC
HCT: 39.3 % (ref 39.0–52.0)
Hemoglobin: 12.4 g/dL — ABNORMAL LOW (ref 13.0–17.0)
MCH: 27.2 pg (ref 26.0–34.0)
MCHC: 31.6 g/dL (ref 30.0–36.0)
MCV: 86.2 fL (ref 80.0–100.0)
Platelets: 256 10*3/uL (ref 150–400)
RBC: 4.56 MIL/uL (ref 4.22–5.81)
RDW: 14.3 % (ref 11.5–15.5)
WBC: 10.3 10*3/uL (ref 4.0–10.5)
nRBC: 0 % (ref 0.0–0.2)

## 2024-03-01 LAB — D-DIMER, QUANTITATIVE: D-Dimer, Quant: <0.27 ug{FEU}/mL (ref 0.00–0.50)

## 2024-03-01 NOTE — ED Provider Notes (Signed)
 [place] EMERGENCY DEPARTMENT AT [facility] Provider Note   CSN: [id number] Arrival date & time: 03/01/24  2039     Patient presents with: Shortness of Breath   Fernando Hess is a 78 y.o. male with past medical history of HLD, HTN, kidney stone, CAD, CABG x 5, PAD, OSA, HFpEF, A-fib, T2DM, CKD, dementia, MG presents emerged department for evaluation of shortness of breath for the past 7 days.  Reports today, he had worsening shortness of breath at suppertime.  Does not wear oxygen at home.  Denies CP, recent travel, surgeries, blood clots, pedal edema, cough, congestion  Of note, was seen on 02/23/2024 and diagnosed with shortness of breath secondary to fluid overload.  He had an IV Lasix  and doubled home Lasix  for the next 3 days which he did    Shortness of Breath      Prior to Admission medications   Medication Sig Start Date End Date Taking? Authorizing Provider  acetaminophen  (TYLENOL ) 325 MG tablet Take 2 tablets (650 mg total) by mouth every 6 (six) hours as needed for moderate pain (pain score 4-6) or mild pain (pain score 1-3). 02/13/24   Arrien, Curlee Doss, MD  apixaban  (ELIQUIS ) 5 MG TABS tablet Take 1 tablet (5 mg total) by mouth 2 (two) times daily. 01/01/23   Hoyt Macleod, MD  Continuous Glucose Sensor (FREESTYLE LIBRE 2 SENSOR) MISC Inject 1 Device into the skin every 14 (fourteen) days. 02/02/24   [provider]  cyanocobalamin  (VITAMIN B12) 1000 MCG/ML injection Inject 1,000 mcg into the muscle once a week. saturdays    [provider]  furosemide  (LASIX ) 40 MG tablet Take 1 tablet (40 mg total) by mouth daily. 02/14/24   Arrien, Curlee Doss, MD  hydrALAZINE  (APRESOLINE ) 25 MG tablet Take 1 tablet (25 mg total) by mouth in the morning and at bedtime. 02/13/24   Arrien, Curlee Doss, MD  insulin  lispro (HUMALOG ) 100 UNIT/ML KwikPen Inject 10 Units into the skin 3 (three) times daily. 05/15/23   [provider]  LANTUS   SOLOSTAR 100 UNIT/ML Solostar Pen Inject 40 Units into the skin daily. 02/19/23   [provider]  MYRBETRIQ  25 MG TB24 tablet Take 25 mg by mouth at bedtime. Patient not taking: Reported on 02/11/2024    [provider]  pantoprazole  (PROTONIX ) 40 MG tablet TAKE 1 TABLET BY MOUTH EVERY DAY Patient taking differently: Take 40 mg by mouth in the morning. 11/25/19   Tobin Forts, MD  polyethylene glycol powder (GLYCOLAX /MIRALAX ) 17 GM/SCOOP powder Take 1 capful (17 g) and mix with 8 oz of water . Take mixture by mouth once daily. 02/13/24   Arrien, Mauricio Daniel, MD  QUEtiapine  (SEROQUEL ) 50 MG tablet Take 50 mg by mouth daily. 02/23/23   [provider]  tamsulosin  (FLOMAX ) 0.4 MG CAPS capsule Take 0.4 mg by mouth daily. 01/10/24   [provider]  TRADJENTA  5 MG TABS tablet Take 5 mg by mouth daily.    [provider]    Allergies: Cilostazol , Magnesium -containing compounds, Donepezil , Dulaglutide, Levofloxacin, Liraglutide, and Lisinopril    Review of Systems  Respiratory:  Positive for shortness of breath.     Updated Vital Signs BP (!) 165/93   Pulse 66   Temp 98.7 F (37.1 C) (Oral)   Resp 16   SpO2 100%   Physical Exam Vitals and nursing note reviewed.  Constitutional:      General: He is not in acute distress.    Appearance:  Normal appearance.  HENT:     Head: Normocephalic and atraumatic.   Eyes:     Conjunctiva/sclera: Conjunctivae normal.    Cardiovascular:     Rate and Rhythm: Normal rate.  Pulmonary:     Effort: Pulmonary effort is normal. No respiratory distress.     Breath sounds: Normal breath sounds.     Comments: Mild tachypnea breathing through nose. Mild SHOB with talking and unable to complete full sentences wo taking a breath  Musculoskeletal:     Right lower leg: No tenderness. No edema.     Left lower leg: No tenderness. No edema.   Skin:    Capillary Refill: Capillary refill takes less than 2 seconds.      Coloration: Skin is not jaundiced or pale.   Neurological:     Mental Status: He is alert and oriented to person, place, and time. Mental status is at baseline.     (all labs ordered are listed, but only abnormal results are displayed) Labs Reviewed  BASIC METABOLIC PANEL WITH GFR - Abnormal; Notable for the following components:      Result Value   CO2 21 (*)    Glucose, Bld 174 (*)    BUN 30 (*)    Creatinine, Ser 2.34 (*)    Calcium  8.8 (*)    GFR, Estimated 28 (*)    All other components within normal limits  CBC - Abnormal; Notable for the following components:   Hemoglobin 12.4 (*)    All other components within normal limits  BRAIN NATRIURETIC PEPTIDE - Abnormal; Notable for the following components:   B Natriuretic Peptide 133.6 (*)    All other components within normal limits  I-STAT VENOUS BLOOD GAS, ED - Abnormal; Notable for the following components:   pCO2, Ven 36.9 (*)    pO2, Ven 137 (*)    HCT 35.0 (*)    Hemoglobin 11.9 (*)    All other components within normal limits  D-DIMER, QUANTITATIVE  TROPONIN I (HIGH SENSITIVITY)    EKG: EKG Interpretation Date/Time:  Tuesday March 01 2024 20:46:28 EDT Ventricular Rate:  68 PR Interval:  243 QRS Duration:  98 QT Interval:  437 QTC Calculation: 465 R Axis:   48  Text Interpretation: Sinus rhythm Prolonged PR interval Low voltage, precordial leads when compared to prior, similar appearance but now sinus. No STEMI Confirmed by Wynell Heath ([id number]) on 03/01/2024 8:54:46 PM  Radiology: DG Chest Portable 1 View Result Date: 03/01/2024 EXAM: 1 VIEW XRAY OF THE CHEST 03/01/2024 09:29:00 PM COMPARISON: 02/23/2024 CLINICAL HISTORY: SOB. Encounter for shortness of breath FINDINGS: LUNGS AND PLEURA: No focal pulmonary opacity. No pulmonary edema. No pleural effusion. No pneumothorax. Skin fold in the left apex simulating a pneumothorax. HEART AND MEDIASTINUM: Postsurgical changes related to prior CABG. Median sternotomy.  BONES AND SOFT TISSUES: No acute osseous abnormality. IMPRESSION: 1. No acute process. Electronically signed by: Zadie Herter MD 03/01/2024 09:33 PM EDT RP Workstation: [id number]     Medications Ordered in the ED - No data to display                                  Medical Decision Making Amount and/or Complexity of Data Reviewed Labs: ordered. Radiology: ordered.  Risk Decision regarding hospitalization.   Patient presents to the ED for concern of [facility], this involves an extensive number of treatment options, and is a complaint that carries with  it a high risk of complications and morbidity.  The differential diagnosis includes COVID, flu, RSV, fluid overload, pneumonia, URI   Co morbidities that complicate the patient evaluation  See HPI   Additional history obtained:  Additional history obtained from Nursing, Outside Medical Records, and Past Admission   External records from outside source obtained and reviewed including triage RN note, ED note from 02/23/24, recent admission from 02/09/24   Lab Tests:  I Ordered, and personally interpreted labs.  The pertinent results include:   Hgb 12.4 CBG 174 CO2 21 BUN 30 Creatinine 2.34 VBG, troponin, BNP, dimer pending   Imaging Studies ordered:  I ordered imaging studies including chest x-ray I independently visualized and interpreted imaging which showed no cardiopulmonary pathology I agree with the radiologist interpretation   Cardiac Monitoring:  The patient was maintained on a cardiac monitor.  I personally viewed and interpreted the cardiac monitored which showed an underlying rhythm of: NSR with no ST nor T wave abnormalities    Consultations Obtained:  I requested consultation with neurology,  and discussed lab and imaging findings as well as pertinent plan - pending   Problem List / ED Course:  SHOB Hx of MG Dimer negative Chest x-ray negative for fluid overload No pedal edema Recently  doubled his Lasix  doses at home due to concern of fluid overload since being seen on 02/23/2024 without relief to shortness of breath No known triggers  Desat to 90% with ambulation from door to bed but maintains O2 sats at rest   Reevaluation:  After the interventions noted above, I reevaluated the patient and found that they have :stayed the same    Dispostion:  After consideration of the diagnostic results and the patients response to treatment, I feel that the patent would benefit from admission for obs in setting of hx of MG, hypoxia with ambulation  Final diagnoses:  SOB (shortness of breath)  History of myasthenia gravis    ED Discharge Orders     None          Royann Cords, PA 03/02/24 0024    Tegeler, Marine Sia, MD 03/03/24 0003

## 2024-03-01 NOTE — ED Triage Notes (Signed)
 Per EMS report pt has had ongoing SOB since last since on 6/10. VS WD. No hypoxia. Report clear lung sounds.

## 2024-03-02 ENCOUNTER — Observation Stay (HOSPITAL_COMMUNITY)

## 2024-03-02 ENCOUNTER — Encounter (HOSPITAL_COMMUNITY): Payer: Self-pay | Admitting: Family Medicine

## 2024-03-02 DIAGNOSIS — R0602 Shortness of breath: Secondary | ICD-10-CM

## 2024-03-02 DIAGNOSIS — I480 Paroxysmal atrial fibrillation: Secondary | ICD-10-CM | POA: Diagnosis not present

## 2024-03-02 DIAGNOSIS — G4733 Obstructive sleep apnea (adult) (pediatric): Secondary | ICD-10-CM

## 2024-03-02 DIAGNOSIS — N1832 Chronic kidney disease, stage 3b: Secondary | ICD-10-CM | POA: Diagnosis not present

## 2024-03-02 DIAGNOSIS — E785 Hyperlipidemia, unspecified: Secondary | ICD-10-CM

## 2024-03-02 DIAGNOSIS — E1169 Type 2 diabetes mellitus with other specified complication: Secondary | ICD-10-CM

## 2024-03-02 DIAGNOSIS — F0390 Unspecified dementia without behavioral disturbance: Secondary | ICD-10-CM

## 2024-03-02 DIAGNOSIS — F418 Other specified anxiety disorders: Secondary | ICD-10-CM | POA: Diagnosis not present

## 2024-03-02 DIAGNOSIS — I5032 Chronic diastolic (congestive) heart failure: Secondary | ICD-10-CM

## 2024-03-02 LAB — I-STAT VENOUS BLOOD GAS, ED
Acid-Base Excess: 0 mmol/L (ref 0.0–2.0)
Bicarbonate: 23.8 mmol/L (ref 20.0–28.0)
Calcium, Ion: 1.17 mmol/L (ref 1.15–1.40)
HCT: 35 % — ABNORMAL LOW (ref 39.0–52.0)
Hemoglobin: 11.9 g/dL — ABNORMAL LOW (ref 13.0–17.0)
O2 Saturation: 99 %
Potassium: 3.5 mmol/L (ref 3.5–5.1)
Sodium: 141 mmol/L (ref 135–145)
TCO2: 25 mmol/L (ref 22–32)
pCO2, Ven: 36.9 mmHg — ABNORMAL LOW (ref 44–60)
pH, Ven: 7.417 (ref 7.25–7.43)
pO2, Ven: 137 mmHg — ABNORMAL HIGH (ref 32–45)

## 2024-03-02 LAB — BASIC METABOLIC PANEL WITH GFR
Anion gap: 10 (ref 5–15)
BUN: 32 mg/dL — ABNORMAL HIGH (ref 8–23)
CO2: 22 mmol/L (ref 22–32)
Calcium: 8.8 mg/dL — ABNORMAL LOW (ref 8.9–10.3)
Chloride: 109 mmol/L (ref 98–111)
Creatinine, Ser: 2.38 mg/dL — ABNORMAL HIGH (ref 0.61–1.24)
GFR, Estimated: 27 mL/min — ABNORMAL LOW (ref >60)
Glucose, Bld: 147 mg/dL — ABNORMAL HIGH (ref 70–99)
Potassium: 3.6 mmol/L (ref 3.5–5.1)
Sodium: 141 mmol/L (ref 135–145)

## 2024-03-02 LAB — PROCALCITONIN: Procalcitonin: <0.1 ng/mL

## 2024-03-02 LAB — CBC
HCT: 39.2 % (ref 39.0–52.0)
Hemoglobin: 12.5 g/dL — ABNORMAL LOW (ref 13.0–17.0)
MCH: 27.5 pg (ref 26.0–34.0)
MCHC: 31.9 g/dL (ref 30.0–36.0)
MCV: 86.3 fL (ref 80.0–100.0)
Platelets: 242 10*3/uL (ref 150–400)
RBC: 4.54 MIL/uL (ref 4.22–5.81)
RDW: 14.3 % (ref 11.5–15.5)
WBC: 10.9 10*3/uL — ABNORMAL HIGH (ref 4.0–10.5)
nRBC: 0 % (ref 0.0–0.2)

## 2024-03-02 LAB — GLUCOSE, CAPILLARY
Glucose-Capillary: 163 mg/dL — ABNORMAL HIGH (ref 70–99)
Glucose-Capillary: 204 mg/dL — ABNORMAL HIGH (ref 70–99)
Glucose-Capillary: 143 mg/dL — ABNORMAL HIGH (ref 70–99)

## 2024-03-02 LAB — CBG MONITORING, ED: Glucose-Capillary: 134 mg/dL — ABNORMAL HIGH (ref 70–99)

## 2024-03-02 LAB — TROPONIN I (HIGH SENSITIVITY)
Troponin I (High Sensitivity): 11 ng/L (ref <18)
Troponin I (High Sensitivity): 11 ng/L (ref <18)

## 2024-03-02 LAB — BRAIN NATRIURETIC PEPTIDE: B Natriuretic Peptide: 133.6 pg/mL — ABNORMAL HIGH (ref 0.0–100.0)

## 2024-03-02 MED ORDER — LAMOTRIGINE 25 MG PO TABS
50.0000 mg | ORAL_TABLET | Freq: Two times a day (BID) | ORAL | Status: DC
  Administered 2024-03-02 – 2024-03-04 (×4): 50 mg via ORAL
  Filled 2024-03-02 (×4): qty 2

## 2024-03-02 MED ORDER — PANTOPRAZOLE SODIUM 40 MG PO TBEC
40.0000 mg | DELAYED_RELEASE_TABLET | Freq: Every day | ORAL | Status: DC
  Administered 2024-03-02 – 2024-03-04 (×3): 40 mg via ORAL
  Filled 2024-03-02 (×3): qty 1

## 2024-03-02 MED ORDER — QUETIAPINE FUMARATE 25 MG PO TABS
50.0000 mg | ORAL_TABLET | Freq: Every day | ORAL | Status: DC
  Administered 2024-03-02 – 2024-03-03 (×2): 50 mg via ORAL
  Filled 2024-03-02 (×2): qty 2

## 2024-03-02 MED ORDER — INSULIN ASPART 100 UNIT/ML IJ SOLN: 0.0000 [IU] | Freq: Every day | INTRAMUSCULAR | Status: DC

## 2024-03-02 MED ORDER — DONEPEZIL HCL 10 MG PO TABS
10.0000 mg | ORAL_TABLET | Freq: Every day | ORAL | Status: DC
  Administered 2024-03-02 – 2024-03-03 (×3): 10 mg via ORAL
  Filled 2024-03-02 (×3): qty 1

## 2024-03-02 MED ORDER — INSULIN GLARGINE-YFGN 100 UNIT/ML ~~LOC~~ SOLN
20.0000 [IU] | Freq: Every day | SUBCUTANEOUS | Status: DC
  Administered 2024-03-02 – 2024-03-04 (×3): 20 [IU] via SUBCUTANEOUS
  Filled 2024-03-02 (×4): qty 0.2

## 2024-03-02 MED ORDER — TAMSULOSIN HCL 0.4 MG PO CAPS
0.4000 mg | ORAL_CAPSULE | Freq: Every day | ORAL | Status: DC
  Administered 2024-03-02 – 2024-03-04 (×3): 0.4 mg via ORAL
  Filled 2024-03-02 (×3): qty 1

## 2024-03-02 MED ORDER — IPRATROPIUM-ALBUTEROL 0.5-2.5 (3) MG/3ML IN SOLN
3.0000 mL | Freq: Four times a day (QID) | RESPIRATORY_TRACT | Status: DC | PRN
  Administered 2024-03-03: 3 mL via RESPIRATORY_TRACT
  Filled 2024-03-02: qty 3

## 2024-03-02 MED ORDER — INSULIN ASPART 100 UNIT/ML IJ SOLN
0.0000 [IU] | Freq: Three times a day (TID) | INTRAMUSCULAR | Status: DC
  Administered 2024-03-02: 1 [IU] via SUBCUTANEOUS
  Administered 2024-03-02 (×2): 2 [IU] via SUBCUTANEOUS
  Administered 2024-03-03: 1 [IU] via SUBCUTANEOUS
  Administered 2024-03-03: 2 [IU] via SUBCUTANEOUS
  Administered 2024-03-03: 1 [IU] via SUBCUTANEOUS
  Administered 2024-03-04: 2 [IU] via SUBCUTANEOUS

## 2024-03-02 MED ORDER — SODIUM CHLORIDE 0.9% FLUSH
3.0000 mL | Freq: Two times a day (BID) | INTRAVENOUS | Status: DC
  Administered 2024-03-02 – 2024-03-03 (×5): 3 mL via INTRAVENOUS

## 2024-03-02 MED ORDER — APIXABAN 5 MG PO TABS
5.0000 mg | ORAL_TABLET | Freq: Two times a day (BID) | ORAL | Status: DC
  Administered 2024-03-02 – 2024-03-04 (×6): 5 mg via ORAL
  Filled 2024-03-02 (×6): qty 1

## 2024-03-02 MED ORDER — QUETIAPINE FUMARATE 25 MG PO TABS
50.0000 mg | ORAL_TABLET | Freq: Every day | ORAL | Status: DC
  Administered 2024-03-02: 50 mg via ORAL
  Filled 2024-03-02: qty 2

## 2024-03-02 MED ORDER — FUROSEMIDE 40 MG PO TABS
40.0000 mg | ORAL_TABLET | Freq: Every day | ORAL | Status: DC
  Administered 2024-03-02 – 2024-03-04 (×3): 40 mg via ORAL
  Filled 2024-03-02 (×3): qty 1

## 2024-03-02 MED ORDER — ACETAMINOPHEN 650 MG RE SUPP: 650.0000 mg | Freq: Four times a day (QID) | RECTAL | Status: DC | PRN

## 2024-03-02 MED ORDER — CALCIUM CARBONATE ANTACID 500 MG PO CHEW
2.0000 | CHEWABLE_TABLET | Freq: Three times a day (TID) | ORAL | Status: DC | PRN
  Administered 2024-03-02 – 2024-03-03 (×2): 400 mg via ORAL
  Filled 2024-03-02 (×2): qty 2

## 2024-03-02 MED ORDER — ACETAMINOPHEN 325 MG PO TABS: 650.0000 mg | ORAL_TABLET | Freq: Four times a day (QID) | ORAL | Status: DC | PRN

## 2024-03-02 MED ORDER — PROCHLORPERAZINE EDISYLATE 10 MG/2ML IJ SOLN
5.0000 mg | Freq: Four times a day (QID) | INTRAMUSCULAR | Status: DC | PRN
  Administered 2024-03-03: 5 mg via INTRAVENOUS
  Filled 2024-03-02: qty 2

## 2024-03-02 NOTE — Progress Notes (Signed)
 Pt started on overnight pulse oximetry. Pt on RA, sats 96, and RR 18.

## 2024-03-02 NOTE — Plan of Care (Signed)

## 2024-03-02 NOTE — Hospital Course (Addendum)
 Fernando Hess is a 78 y.o. male with medical history significant for type 2 diabetes mellitus, CAD status post CABG, chronic HFpEF, CKD 3B, depression, anxiety, dementia, and OSA on CPAP, now presenting with 1 week of shortness of breath that worsened yesterday evening.   Patient developed shortness of breath on 02/23/2024 and was seen in the emergency department the same day.  He was suspected to be hypervolemic, was given a dose of IV Lasix , and then doubled his home Lasix  for the following 3 days.  He has continued to feel short of breath since then and worsened yesterday evening.  He denies any chest pain, cough, swelling, fever, or chills. He denies rhinorrhea or sore throat. He has trouble breathing when laying flat but states that he has always had that.    ED Course: Upon arrival to the ED, patient is found to be afebrile and saturating well on room air while at rest with mild tachypnea and stable BP.  EKG demonstrates sinus rhythm with first given node block.  Chest x-ray is negative for acute cardiopulmonary disease.  Labs are most notable for creatinine 2.34, normal WBC, normal troponin, undetectable D-dimer, and BNP 134.   ED PA discussed the situation with neurology, questioning whether the presentation could be related to myasthenia gravis flare, but neurology does not believe that the patient actually has myasthenia gravis based on his prior test results.   Assessment/Plan    SOB  - Etiology unclear. Differential includes MG related? (Seroneg diagnosis per outpt neuro) vs cardiac/CHF vs psychosomatic (underlying dementia) vs apnea/hypoxia vs less likely infectious - last echo is uptodate and does not show reduced EF or severe diastolic failure nor suggestion of elevated right heart pressures (e.g. 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 Euna outpatient with [facility], Fernando Hess - chart review from [facility] hospitalization in May 2020 4 mentions ptotic episodes versus concern for Lewy body dementia - because of sero-neg testing, the diagnosis has been questionable; but wife states he's had nerve conduction/EMG testing with [facility] (I cannot locate these in epic) - NIF > -40 with adequate VCs  Dementia  - Continue Aricept , use delirium precautions  - well discussed in note review and seems to have some progressive signs and symptoms over the past year at least also   Chronic HFpEF  - Appears compensated  - no need to repeat echo at this time  - Continue Lasix     PAF  - continue Eliquis     Insulin -dependent DM  - A1c was 10.8% in May 2025  - resume home regimen    CAD  - No angina    CKD 3B; borderline stage IV - creat baseline 2 - 2.2 with GFR low 30s (but dipping into upper 20s lately)   Depression, anxiety  - Continue Lamictal , Seroquel     OSA  - CPAP while sleeping; nocturnal O2

## 2024-03-02 NOTE — Progress Notes (Signed)
 Good pt Effort NIF -30                         VC 2.1L

## 2024-03-02 NOTE — Progress Notes (Addendum)
 Progress Note    Fernando Hess   WUJ:[id number]  DOB: 04-18-1946  DOA: 03/01/2024     0 PCP: Jimmey Mould, MD  Initial CC: SOB  Hospital Course: Fernando Hess is a 78 y.o. male with medical history significant for type 2 diabetes mellitus, CAD status post CABG, chronic HFpEF, CKD 3B, depression, anxiety, dementia, and OSA on CPAP, now presenting with 1 week of shortness of breath that worsened yesterday evening.   Patient developed shortness of breath on 02/23/2024 and was seen in the emergency department the same day.  He was suspected to be hypervolemic, was given a dose of IV Lasix , and then doubled his home Lasix  for the following 3 days.  He has continued to feel short of breath since then and worsened yesterday evening.  He denies any chest pain, cough, swelling, fever, or chills. He denies rhinorrhea or sore throat. He has trouble breathing when laying flat but states that he has always had that.    ED Course: Upon arrival to the ED, patient is found to be afebrile and saturating well on room air while at rest with mild tachypnea and stable BP.  EKG demonstrates sinus rhythm with first given node block.  Chest x-ray is negative for acute cardiopulmonary disease.  Labs are most notable for creatinine 2.34, normal WBC, normal troponin, undetectable D-dimer, and BNP 134.   ED PA discussed the situation with neurology, questioning whether the presentation could be related to myasthenia gravis flare, but neurology does not believe that the patient actually has myasthenia gravis based on his prior test results.   Assessment/Plan    SOB  - Etiology unclear. Differential includes MG related? (Seroneg diagnosis per outpt neuro) vs cardiac/CHF vs psychosomatic (underlying dementia) vs apnea/hypoxia vs less likely infectious - last echo is uptodate and does not show reduced EF or severe diastolic failure nor suggestion of elevated right heart pressures (e.g. 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 outpatient with [facility], [name] - chart review from [facility] hospitalization in May 2020 4 mentions ptotic episodes versus concern for Lewy body dementia - because of sero-neg testing, the diagnosis has been questionable; but wife states he's had nerve conduction/EMG testing with [facility] (I cannot locate these in epic) - as above, continue NIF and VC q12h for now  Dementia  - Continue Aricept , use delirium precautions  - well discussed in note review and seems to have some progressive signs and symptoms over the past year at least also   Chronic HFpEF  - Appears compensated  - no need to repeat echo at this time  - Continue Lasix     PAF  - continue Eliquis     Insulin -dependent DM  - A1c was 10.8% in May 2025  - Check CBGs, continue basal insulin , use sliding-scale  correctional for now     CAD  - No angina    CKD 3B; borderline stage IV - creat baseline 2 - 2.2 with GFR low 30s (but dipping into upper 20s lately) - will continue trending for now   Depression, anxiety  - Continue Lamictal , Seroquel     OSA  - CPAP while sleeping  Interval History:  Patient resting comfortably in bed when seen this morning.  Underlying cognitive impairment appreciated.  He seems to still endorse some difficulty breathing with  shortness of breath ambulating short distances but is unable to elaborate much.  Called and spoke with his wife late afternoon and she was able to further clarify much more regarding his SOB and LE weakness and falls.   Old records reviewed in assessment of this patient  Antimicrobials:   DVT prophylaxis:   apixaban  (ELIQUIS ) tablet 5 mg   Code Status:   Code Status: Full Code  Mobility Assessment (Last 72 Hours)     Mobility Assessment     Row Name 03/02/24 1058 03/02/24 0802 03/02/24 0252       Does patient have an order for bedrest or is patient medically unstable -- No - Continue assessment No - Continue assessment     What is the highest level of mobility based on the progressive mobility assessment? Level 5 (Walks with assist in room/hall) - Balance while stepping forward/back and can walk in room with assist - Complete -- Level 4 (Walks with assist in room) - Balance while marching in place and cannot step forward and back - Complete     Is the above level different from baseline mobility prior to current illness? -- -- No - Consider discontinuing PT/OT        Barriers to discharge:  Disposition Plan: Home HH orders placed:  Status is: Observation  Objective: Blood pressure (!) 140/76, pulse 77, temperature 97.8 F (36.6 C), resp. rate 16, height 5' 6 (1.676 m), weight 83.1 kg, SpO2 98%.  Examination:  Physical Exam Constitutional:      General: He is not in acute distress.    Appearance: Normal appearance.  HENT:     Head: Normocephalic and atraumatic.     Mouth/Throat:     Mouth: Mucous membranes are moist.   Eyes:     Extraocular Movements: Extraocular movements intact.    Cardiovascular:     Rate and Rhythm: Normal rate and regular rhythm.  Pulmonary:     Effort: Pulmonary effort is normal. No respiratory distress.     Breath sounds: Normal breath sounds. No wheezing.  Abdominal:     General: Bowel sounds are normal. There is no distension.      Palpations: Abdomen is soft.     Tenderness: There is no abdominal tenderness.   Musculoskeletal:        General: Normal range of motion.     Cervical back: Normal range of motion and neck supple.   Skin:    General: Skin is warm and dry.   Neurological:     Mental Status: He is alert.     Comments: Underlying cognitive impairment appreciated.  Follows commands and moves all 4 extremities  Psychiatric:        Mood and Affect: Mood normal.        Behavior: Behavior normal.      Consultants:    Procedures:    Data Reviewed: Results for orders placed or performed during the hospital encounter of 03/01/24 (from the past 24  hours)  Basic metabolic panel     Status: Abnormal   Collection Time: 03/01/24  8:54 PM  Result Value Ref Range   Sodium 138 135 - 145 mmol/L   Potassium 3.8 3.5 - 5.1 mmol/L   Chloride 107 98 - 111 mmol/L   CO2 21 (L) 22 - 32 mmol/L   Glucose, Bld 174 (H) 70 - 99 mg/dL   BUN 30 (H) 8 - 23 mg/dL   Creatinine, Ser 1.61 (H) 0.61 - 1.24 mg/dL   Calcium  8.8 (L) 8.9 - 10.3 mg/dL   GFR, Estimated 28 (L) >60 mL/min   Anion gap 10 5 - 15  CBC     Status: Abnormal   Collection Time: 03/01/24  8:54 PM  Result Value Ref Range   WBC 10.3 4.0 - 10.5 K/uL   RBC 4.56 4.22 - 5.81 MIL/uL   Hemoglobin 12.4 (L) 13.0 - 17.0 g/dL   HCT 09.6 04.5 - 40.9 %   MCV 86.2 80.0 - 100.0 fL   MCH 27.2 26.0 - 34.0 pg   MCHC 31.6 30.0 - 36.0 g/dL   RDW 81.1 91.4 - 78.2 %   Platelets 256 150 - 400 K/uL   nRBC 0.0 0.0 - 0.2 %  D-dimer, quantitative     Status: None   Collection Time: 03/01/24 11:08 PM  Result Value Ref Range   D-Dimer, Quant <0.27 0.00 - 0.50 ug/mL-FEU  Brain natriuretic peptide     Status: Abnormal   Collection Time: 03/01/24 11:08 PM  Result Value Ref Range   B Natriuretic Peptide 133.6 (H) 0.0 - 100.0 pg/mL  Troponin I (High Sensitivity)     Status: None   Collection Time: 03/01/24 11:08 PM  Result Value Ref Range   Troponin I (High Sensitivity) 11 <18  ng/L  I-Stat venous blood gas, ([facility], [facility], [facility])     Status: Abnormal   Collection Time: 03/02/24 12:06 AM  Result Value Ref Range   pH, Ven 7.417 7.25 - 7.43   pCO2, Ven 36.9 (L) 44 - 60 mmHg   pO2, Ven 137 (H) 32 - 45 mmHg   Bicarbonate 23.8 20.0 - 28.0 mmol/L   TCO2 25 22 - 32 mmol/L   O2 Saturation 99 %   Acid-Base Excess 0.0 0.0 - 2.0 mmol/L   Sodium 141 135 - 145 mmol/L   Potassium 3.5 3.5 - 5.1 mmol/L   Calcium , Ion 1.17 1.15 - 1.40 mmol/L   HCT 35.0 (L) 39.0 - 52.0 %   Hemoglobin 11.9 (L) 13.0 - 17.0 g/dL   Sample type VENOUS   CBG monitoring, ED     Status: Abnormal   Collection Time: 03/02/24  1:15 AM  Result Value Ref Range   Glucose-Capillary 134 (H) 70 - 99 mg/dL  Troponin I (High Sensitivity)     Status: None   Collection Time: 03/02/24  1:17 AM  Result Value Ref Range   Troponin I (High Sensitivity) 11 <18 ng/L  Basic metabolic panel     Status: Abnormal   Collection Time: 03/02/24  1:17 AM  Result Value Ref Range   Sodium 141 135 - 145 mmol/L   Potassium 3.6 3.5 - 5.1 mmol/L   Chloride 109 98 - 111 mmol/L   CO2 22 22 - 32 mmol/L   Glucose, Bld 147 (H) 70 - 99 mg/dL   BUN 32 (H) 8 - 23 mg/dL   Creatinine, Ser 9.56 (H) 0.61 - 1.24 mg/dL   Calcium  8.8 (L) 8.9 -  10.3 mg/dL   GFR, Estimated 27 (L) >60 mL/min   Anion gap 10 5 - 15  CBC     Status: Abnormal   Collection Time: 03/02/24  1:17 AM  Result Value Ref Range   WBC 10.9 (H) 4.0 - 10.5 K/uL   RBC 4.54 4.22 - 5.81 MIL/uL   Hemoglobin 12.5 (L) 13.0 - 17.0 g/dL   HCT 16.1 09.6 - 04.5 %   MCV 86.3 80.0 - 100.0 fL   MCH 27.5 26.0 - 34.0 pg   MCHC 31.9 30.0 - 36.0 g/dL   RDW 40.9 81.1 - 91.4 %   Platelets 242 150 - 400 K/uL   nRBC 0.0 0.0 - 0.2 %  Glucose, capillary     Status: Abnormal   Collection Time: 03/02/24  9:32 AM  Result Value Ref Range   Glucose-Capillary 163 (H) 70 - 99 mg/dL  Glucose, capillary     Status: Abnormal   Collection Time: 03/02/24 12:55 PM  Result Value Ref Range    Glucose-Capillary 204 (H) 70 - 99 mg/dL    I have reviewed pertinent nursing notes, vitals, labs, and images as necessary. I have ordered labwork to follow up on as indicated.  I have reviewed the last notes from staff over past 24 hours. I have discussed patient's care plan and test results with nursing staff, CM/SW, and other staff as appropriate.  Time spent: Greater than 50% of the 55 minute visit was spent in counseling/coordination of care for the patient as laid out in the A&P.   LOS: 0 days   Faith Homes, MD [facility] 03/02/2024, 5:36 PM

## 2024-03-02 NOTE — Care Management Obs Status (Signed)
 MEDICARE OBSERVATION STATUS NOTIFICATION   Patient Details  Name: Fernando Hess MRN: [id number] Date of Birth: 1946/02/11   Medicare Observation Status Notification Given:  Yes Letter signed and copy given   Wynonia Hedges 03/02/2024, 9:24 AM

## 2024-03-02 NOTE — Discharge Instructions (Signed)

## 2024-03-02 NOTE — ED Provider Notes (Signed)
  12:35 AM Spoke to neurology, Dr. Welford Haley with patient from prior.  States all of his testing is actually negative for myasthenia and does not feel this is his issue.  Seems to have some aspect of dementia from prior evaluations.  Recommends medical admission but really no input from neurology at this time.  Spoke with hospitalist, Dr. Brice Campi-- will admit for ongoing care.   Coretha Dew, [name] 03/02/24 0118    Ballard Bongo, MD 03/04/24 [phone]

## 2024-03-02 NOTE — H&P (Signed)
 History and Physical    Fernando Hess:[id number] DOB: May 26, 1946 DOA: 03/01/2024  PCP: Jimmey Mould, MD   Patient coming from: Home   Chief Complaint: SOB   HPI: Fernando LEHENBAUER is a 78 y.o. male with medical history significant for type 2 diabetes mellitus, CAD status post CABG, chronic HFpEF, CKD 3B, depression, anxiety, dementia, and OSA on CPAP, now presenting with 1 week of shortness of breath that worsened yesterday evening.  Patient developed shortness of breath on 02/23/2024 and was seen in the emergency department the same day.  He was suspected to be hypervolemic, was given a dose of IV Lasix , and then doubled his home Lasix  for the following 3 days.  He has continued to feel short of breath since then and worsened yesterday evening.  He denies any chest pain, cough, swelling, fever, or chills. He denies rhinorrhea or sore throat. He has trouble breathing when laying flat but states that he has always had that.   ED Course: Upon arrival to the ED, patient is found to be afebrile and saturating well on room air while at rest with mild tachypnea and stable BP.  EKG demonstrates sinus rhythm with first given node block.  Chest x-ray is negative for acute cardiopulmonary disease.  Labs are most notable for creatinine 2.34, normal WBC, normal troponin, undetectable D-dimer, and BNP 134.  ED PA discussed the situation with neurology, questioning whether the presentation could be related to myasthenia gravis flare, but neurology does not believe that the patient actually has myasthenia gravis based on his prior test results.   Review of Systems:  All other systems reviewed and apart from HPI, are negative.  Past Medical History:  Diagnosis Date   Acute renal failure (ARF) ([name]) 09/24/2015   Acute stress disorder 11/13/2021   Atrial premature complexes    CAD (coronary artery disease) of artery bypass graft    Early occlusion of saphenous vein graft to intermediate and marginal  branch in February 2007 following bypass grafting    CAD (coronary artery disease), native coronary artery 2017   hx NSTEMI 09-24-2015  s/p  CABG x5 on 10-02-2015;  post op STEMI inferolateral wall,  SVG OM1 and SVG OM2 occluded, distal OM occlusion the calpruit, treated medically // Myoview  7/21: no ischemia, EF 65, low risk   CHF (congestive heart failure) ([name])    CKD (chronic kidney disease), stage III ([name])    Contusion of right knee 03/16/2020   Dyspnea    Elevated troponin    Erectile dysfunction    Esophageal reflux    History of atrial fibrillation    post op CABG 10-02-2015   History of kidney stones    History of non-ST elevation myocardial infarction (NSTEMI) 09/24/2015   s/p  CABG x5   History of ST elevation myocardial infarction (STEMI) 10/22/2015   inferior wall,  post op CABG 10-02-2015   Hyperlipidemia    Hypertension    Left ureteral stone    Mild atherosclerosis of both carotid arteries    Nephrolithiasis    per CT bilateral non-obstructive calculi   OSA (obstructive sleep apnea)    Peripheral artery disease    LE Arterial US  01/2019: R PTA and ATA occluded; L ATA occluded   RBBB (right bundle branch block)    Renal atrophy, right    Sleep apnea    wears cpap    ST elevation myocardial infarction (STEMI) of inferior wall ([name]) 10/22/2015   Type 2 diabetes mellitus  treated with insulin  ([name])    followed by pcp   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) 03/01/2008   Wears glasses     Past Surgical History:  Procedure Laterality Date   APPENDECTOMY  1965   CARDIAC CATHETERIZATION N/A 09/26/2015   Procedure: Left Heart Cath and Coronary Angiography;  Surgeon: Millicent Ally, MD;  Location: [facility] INVASIVE CV LAB;  Service: Cardiovascular;  Laterality: N/A;   CARDIAC CATHETERIZATION N/A 10/22/2015   Procedure: Left Heart Cath and Coronary Angiography;  Surgeon: Arnoldo Lapping, MD;  Location:  [facility] INVASIVE CV LAB;  Service: Cardiovascular;  Laterality: N/A;   CATARACT EXTRACTION W/ INTRAOCULAR LENS  IMPLANT, BILATERAL  2017   COLONOSCOPY     CORONARY ARTERY BYPASS GRAFT N/A 10/02/2015   Procedure: CORONARY ARTERY BYPASS GRAFTING (CABG) X5 LIMA-LAD; SVG-DIAG; SVG-OM; SVG-PD; SVG-RAMUS TRANSESOPHAGEAL ECHOCARDIOGRAM (TEE) ENDOSCOPIC GREATER SAPHENOUS VEIN  HARVEST BILAT LE;  Surgeon: Heriberto London, MD;  Location: [facility] OR;  Service: Open Heart Surgery;  Laterality: N/A;   CYSTOSCOPY/URETEROSCOPY/HOLMIUM LASER/STENT PLACEMENT Left 08/10/2018   Procedure: CYSTOSCOPY/URETEROSCOPY/HOLMIUM LASER/STENT PLACEMENT;  Surgeon: Christina Coyer, MD;  Location: [facility];  Service: Urology;  Laterality: Left;   CYSTOSCOPY/URETEROSCOPY/HOLMIUM LASER/STENT PLACEMENT Left 09/10/2018   Procedure: CYSTOSCOPY/URETEROSCOPY/HOLMIUM LASER/STENT EXCHANGE;  Surgeon: Christina Coyer, MD;  Location: [facility] ORS;  Service: Urology;  Laterality: Left;   HIP ARTHROPLASTY Right 12/26/2022   Procedure: RIGHT HEMI HIP ARTHROPLASTY;  Surgeon: Murleen Arms, MD;  Location: [facility] OR;  Service: Orthopedics;  Laterality: Right;   LEFT HEART CATHETERIZATION WITH CORONARY ANGIOGRAM N/A 04/13/2014   Procedure: LEFT HEART CATHETERIZATION WITH CORONARY ANGIOGRAM;  Surgeon: Dorsey Gault, MD;  Location: [facility] CATH LAB;  Service: Cardiovascular;  Laterality: N/A;   LEG SURGERY Right age 57   closed reduction leg fracture   NASAL SEPTOPLASTY W/ TURBINOPLASTY Bilateral 08/30/2021   Procedure: NASAL SEPTOPLASTY WITH BILATERAL INFERIOR TURBINATE REDUCTION;  Surgeon: Ammon Bales, MD;  Location: [facility] OR;  Service: ENT;  Laterality: Bilateral;   POLYPECTOMY     RIGHT HEART CATH N/A 06/06/2021   Procedure: RIGHT HEART CATH;  Surgeon: Mardell Shade, MD;  Location: [facility] INVASIVE CV LAB;  Service: Cardiovascular;  Laterality: N/A;   TEE WITHOUT CARDIOVERSION N/A 10/02/2015   Procedure: TRANSESOPHAGEAL ECHOCARDIOGRAM  (TEE);  Surgeon: Heriberto London, MD;  Location: [facility] OR;  Service: Open Heart Surgery;  Laterality: N/A;   URETEROSCOPY WITH HOLMIUM LASER LITHOTRIPSY Bilateral 2004;  2005  dr grapey  @[facility]    VASECTOMY      Social History:   reports that he has never smoked. He has never used smokeless tobacco. He reports that he does not currently use alcohol . He reports that he does not use drugs.  Allergies  Allergen Reactions   Cilostazol  Swelling and Other (See Comments)    Edema    Magnesium -Containing Compounds Other (See Comments)    Avoid magnesium  with hx myasthenia gravis (can worsen muscle weakness and trigger a severe exacerbation)   Donepezil  Other (See Comments)    seizure   Dulaglutide Nausea And Vomiting and Other (See Comments)    TRULICITY   Levofloxacin Hives, Itching and Rash   Liraglutide Other (See Comments)    Severe fatigue & insomnia   Lisinopril Itching, Rash and Cough    Family History  Problem Relation Age of Onset   Heart attack Mother    Heart attack Father    Diabetes Brother  Pancreatic cancer Brother    Diabetes Brother    Colon cancer Neg Hx    Esophageal cancer Neg Hx    Prostate cancer Neg Hx    Rectal cancer Neg Hx    Stomach cancer Neg Hx    Colon polyps Neg Hx      Prior to Admission medications   Medication Sig Start Date End Date Taking? Authorizing Provider  lamoTRIgine  (LAMICTAL ) 25 MG tablet Take by mouth. 02/18/24  Yes [provider]  acetaminophen  (TYLENOL ) 325 MG tablet Take 2 tablets (650 mg total) by mouth every 6 (six) hours as needed for moderate pain (pain score 4-6) or mild pain (pain score 1-3). 02/13/24   Arrien, Curlee Doss, MD  apixaban  (ELIQUIS ) 5 MG TABS tablet Take 1 tablet (5 mg total) by mouth 2 (two) times daily. 01/01/23   Hoyt Macleod, MD  Continuous Glucose Sensor (FREESTYLE LIBRE 2 SENSOR) MISC Inject 1 Device into the skin every 14 (fourteen) days. 02/02/24   [provider]  cyanocobalamin   (VITAMIN B12) 1000 MCG/ML injection Inject 1,000 mcg into the muscle once a week. saturdays    [provider]  donepezil  (ARICEPT ) 10 MG tablet Take 10 mg by mouth at bedtime.    [provider]  furosemide  (LASIX ) 40 MG tablet Take 1 tablet (40 mg total) by mouth daily. 02/14/24   Arrien, Curlee Doss, MD  hydrALAZINE  (APRESOLINE ) 25 MG tablet Take 1 tablet (25 mg total) by mouth in the morning and at bedtime. 02/13/24   Arrien, Curlee Doss, MD  insulin  lispro (HUMALOG ) 100 UNIT/ML KwikPen Inject 10 Units into the skin 3 (three) times daily. 05/15/23   [provider]  LANTUS  SOLOSTAR 100 UNIT/ML Solostar Pen Inject 40 Units into the skin daily. 02/19/23   [provider]  MYRBETRIQ  25 MG TB24 tablet Take 25 mg by mouth at bedtime. Patient not taking: Reported on 02/11/2024    [provider]  pantoprazole  (PROTONIX ) 40 MG tablet TAKE 1 TABLET BY MOUTH EVERY DAY Patient taking differently: Take 40 mg by mouth in the morning. 11/25/19   Tobin Forts, MD  polyethylene glycol powder (GLYCOLAX /MIRALAX ) 17 GM/SCOOP powder Take 1 capful (17 g) and mix with 8 oz of water . Take mixture by mouth once daily. 02/13/24   Arrien, Mauricio Daniel, MD  QUEtiapine  (SEROQUEL ) 50 MG tablet Take 50 mg by mouth daily. 02/23/23   [provider]  tamsulosin  (FLOMAX ) 0.4 MG CAPS capsule Take 0.4 mg by mouth daily. 01/10/24   [provider]  TRADJENTA  5 MG TABS tablet Take 5 mg by mouth daily.    [provider]    Physical Exam: Vitals:   03/01/24 2245 03/01/24 2315 03/01/24 2330 03/02/24 0015  BP: (!) 154/74 131/68 139/64 (!) 165/93  Pulse: (!) 58 60 65 66  Resp: 19 18 17 16   Temp:      TempSrc:      SpO2: 100% 100% 100% 100%    Constitutional: NAD, no pallor or diaphoresis   Eyes: PERTLA, lids and conjunctivae normal ENMT: Mucous membranes are moist. Posterior pharynx clear of any exudate or lesions.   Neck: supple, no masses   Respiratory: Speaking full sentences. No wheezing, no crackles.   Cardiovascular: S1 & S2 heard, regular rate and rhythm. Trace lower extremity edema. No JVD. Abdomen: No tenderness, soft. Bowel sounds active.  Musculoskeletal: no clubbing / cyanosis. No joint deformity upper and lower extremities.   Skin: no significant rashes, lesions, ulcers. Warm,  dry, well-perfused. Neurologic: CN 2-12 grossly intact. Moving all extremities. Alert and oriented.  Psychiatric: Calm. Cooperative.    Labs and Imaging on Admission: I have personally reviewed following labs and imaging studies  CBC: Recent Labs  Lab 03/01/24 2054 03/02/24 0006  WBC 10.3  --   HGB 12.4* 11.9*  HCT 39.3 35.0*  MCV 86.2  --   PLT 256  --    Basic Metabolic Panel: Recent Labs  Lab 03/01/24 2054 03/02/24 0006  NA 138 141  K 3.8 3.5  CL 107  --   CO2 21*  --   GLUCOSE 174*  --   BUN 30*  --   CREATININE 2.34*  --   CALCIUM  8.8*  --    GFR: Estimated Creatinine Clearance: 25.9 mL/min (A) (by C-G formula based on SCr of 2.34 mg/dL (H)). Liver Function Tests: No results for input(s): AST, ALT, ALKPHOS, BILITOT, PROT, ALBUMIN  in the last 168 hours. No results for input(s): LIPASE, AMYLASE in the last 168 hours. No results for input(s): AMMONIA in the last 168 hours. Coagulation Profile: No results for input(s): INR, PROTIME in the last 168 hours. Cardiac Enzymes: No results for input(s): CKTOTAL, CKMB, CKMBINDEX, TROPONINI in the last 168 hours. BNP (last 3 results) No results for input(s): PROBNP in the last 8760 hours. HbA1C: No results for input(s): HGBA1C in the last 72 hours. CBG: No results for input(s): GLUCAP in the last 168 hours. Lipid Profile: No results for input(s): CHOL, HDL, LDLCALC, TRIG, CHOLHDL, LDLDIRECT in the last 72 hours. Thyroid  Function Tests: No results for input(s): TSH, T4TOTAL, FREET4, T3FREE, THYROIDAB in the last 72  hours. Anemia Panel: No results for input(s): VITAMINB12, FOLATE, FERRITIN, TIBC, IRON, RETICCTPCT in the last 72 hours. Urine analysis:    Component Value Date/Time   COLORURINE YELLOW 07/15/2023 2338   APPEARANCEUR CLEAR 07/15/2023 2338   LABSPEC 1.026 07/15/2023 2338   PHURINE 5.0 07/15/2023 2338   GLUCOSEU NEGATIVE 07/15/2023 2338   HGBUR MODERATE (A) 07/15/2023 2338   BILIRUBINUR NEGATIVE 07/15/2023 2338   KETONESUR NEGATIVE 07/15/2023 2338   PROTEINUR 100 (A) 07/15/2023 2338   UROBILINOGEN 0.2 10/02/2009 1604   NITRITE NEGATIVE 07/15/2023 2338   LEUKOCYTESUR NEGATIVE 07/15/2023 2338   Sepsis Labs: @LABRCNTIP (procalcitonin:4,lacticidven:4) )No results found for this or any previous visit (from the past 240 hours).   Radiological Exams on Admission: DG Chest Portable 1 View Result Date: 03/01/2024 EXAM: 1 VIEW XRAY OF THE CHEST 03/01/2024 09:29:00 PM COMPARISON: 02/23/2024 CLINICAL HISTORY: SOB. Encounter for shortness of breath FINDINGS: LUNGS AND PLEURA: No focal pulmonary opacity. No pulmonary edema. No pleural effusion. No pneumothorax. Skin fold in the left apex simulating a pneumothorax. HEART AND MEDIASTINUM: Postsurgical changes related to prior CABG. Median sternotomy. BONES AND SOFT TISSUES: No acute osseous abnormality. IMPRESSION: 1. No acute process. Electronically signed by: Zadie Herter MD 03/01/2024 09:33 PM EDT RP Workstation: [id number]    EKG: Independently reviewed. Sinus rhythm, 1st degree AV block.   Assessment/Plan   1. SOB  - Etiology unclear but fortunately he is well-appearing with normal RR and saturation upper 90s at rest  - Check chest CT, trial nebs, follow clinical course    2. Chronic HFpEF  - Appears compensated  - Continue Lasix    3. PAF  - Eliquis    4. Insulin -dependent DM  - A1c was 10.8% in May 2025  - Check CBGs, continue basal insulin , use sliding-scale correctional for now    5. CAD  - No angina  6. CKD 3B   - Appears close to baseline  - Renally-dose medications   7. Depression, anxiety  - Continue Lamictal , Seroquel    8. OSA  - CPAP while sleeping   8. Dementia  - Continue Aricept , use delirium precautions    DVT prophylaxis: Eliquis   Code Status: Full  Level of Care: Level of care: Telemetry Medical Family Communication: None present   Disposition Plan:  Patient is from: Home  Anticipated d/c is to: TBD Anticipated d/c date is: 6/19 or 03/04/24  Patient currently: Improved respiratory status  Consults called: None  Admission status: Observation     Walton Guppy, MD [facility]  03/02/2024, 1:02 AM

## 2024-03-02 NOTE — Progress Notes (Signed)
 With excellent effort patient did > -40 on NIF

## 2024-03-02 NOTE — ED Notes (Signed)
 When ambulating pt O2 dropped down to 90%

## 2024-03-02 NOTE — Progress Notes (Addendum)
 Ambulated with patient in room on room air, O2 sats started at 97 dropped no farther than 92 while ambulating. Ambulated length of patients room, per patients request. Patient tolerated ambulation well no complaints of shortness of breath.patient placed back on 1L oxygen nasal cannula after ambulation O2  test.

## 2024-03-03 DIAGNOSIS — G4734 Idiopathic sleep related nonobstructive alveolar hypoventilation: Secondary | ICD-10-CM

## 2024-03-03 DIAGNOSIS — I480 Paroxysmal atrial fibrillation: Secondary | ICD-10-CM | POA: Diagnosis not present

## 2024-03-03 DIAGNOSIS — R0602 Shortness of breath: Secondary | ICD-10-CM | POA: Diagnosis not present

## 2024-03-03 LAB — GLUCOSE, CAPILLARY
Glucose-Capillary: 188 mg/dL — ABNORMAL HIGH (ref 70–99)
Glucose-Capillary: 224 mg/dL — ABNORMAL HIGH (ref 70–99)
Glucose-Capillary: 187 mg/dL — ABNORMAL HIGH (ref 70–99)
Glucose-Capillary: 161 mg/dL — ABNORMAL HIGH (ref 70–99)
Glucose-Capillary: 177 mg/dL — ABNORMAL HIGH (ref 70–99)

## 2024-03-03 NOTE — Progress Notes (Signed)
 Mobility Specialist: Progress Note   03/03/24 0925  Mobility  Activity Ambulated with assistance in hallway  Level of Assistance Standby assist, set-up cues, supervision of patient - no hands on  Assistive Device Front wheel walker  Distance Ambulated (ft) 200 ft  Activity Response Tolerated well  Mobility Referral Yes  Mobility visit 1 Mobility  Mobility Specialist Start Time (ACUTE ONLY) [id number]  Mobility Specialist Stop Time (ACUTE ONLY) 0843  Mobility Specialist Time Calculation (min) (ACUTE ONLY) 17 min    Pt received in chair agreeable to mobility session. Completed ambulatory sat test (see following note). C/o dry throat and fatigued at EOS. SpO2 WFL on RA. Returned to room without fault. Left in chair with all needs met, call bell in reach.   Deloria Fetch Mobility Specialist Please contact via SecureChat or Rehab office at [phone]

## 2024-03-03 NOTE — Plan of Care (Signed)

## 2024-03-03 NOTE — Progress Notes (Signed)
    Durable Medical Equipment  (From admission, onward)           Start     Ordered   03/03/24 1518  For home use only DME oxygen  Once       Question Answer Comment  Length of Need Lifetime   Mode or (Route) Nasal cannula   Liters per Minute 2   Frequency Only at night (stationary unit needed)   Oxygen conserving device Yes   Oxygen delivery system Gas      03/03/24 1517

## 2024-03-03 NOTE — Progress Notes (Signed)
 NIF: -40 VC: 1.64L   With excellent patient effort.

## 2024-03-03 NOTE — Evaluation (Signed)
 Physical Therapy Evaluation Patient Details Name: Fernando Hess MRN: [id number] DOB: 1945/10/22 Today's Date: 03/03/2024  History of Present Illness  Patient is 78 y.o. male presented [facility]/17 with 1 week of progressive shortness of breath, thought to be secondary to hypervolemia not improved with increased Lasix  which worsened day of admission. PMH: type 2 diabetes mellitus, CAD status post CABG, chronic HFpEF, CKD 3B, depression, anxiety, dementia, and OSA on CPAP,  Clinical Impression  PTA pt living in 2 story home with 5 steps to enter and chair lift to bed and bath on second story. Pt reports use of Rollator at home for ambulation and use of wheelchair for community level distances. Pt had HHAide, but let them go due to them not doing what they were asked pt reports he is able to perform ADLs and iADLs with help/supervision of wife. Pt is mod I for transfers and ambulation with RW, and supervision for stair training. Pt report he has fallen 2x with his Rollator, one time outside off the driveway. Rollator is a new addition since prior hospitalization. PT recommending HHPT to work on [employer] safety inside and outside the home.  Mobility Specialist is following patient. PT is signing off.       If plan is discharge home, recommend the following: A little help with walking and/or transfers;A little help with bathing/dressing/bathroom;Assistance with cooking/housework;Direct supervision/assist for medications management;Direct supervision/assist for financial management;Assist for transportation;Supervision due to cognitive status   Can travel by private vehicle    Yes    Equipment Recommendations None recommended by PT (has necessary DME)     Functional Status Assessment Patient has had a recent decline in their functional status and demonstrates the ability to make significant improvements in function in a reasonable and predictable amount of time.     Precautions / Restrictions  Precautions Precautions: Fall Recall of Precautions/Restrictions: Intact Restrictions Weight Bearing Restrictions Per Provider Order: No      Mobility  Bed Mobility               General bed mobility comments: sitting up in recliner    Transfers Overall transfer level: Modified independent Equipment used: Rolling walker (2 wheels)               General transfer comment: good power up and self steadying in RW, reaches back to armrest and lowers himself slowly back into chair    Ambulation/Gait Ambulation/Gait assistance: Modified independent (Device/Increase time) Gait Distance (Feet): 500 Feet Assistive device: Rolling walker (2 wheels) Gait Pattern/deviations: Step-through pattern, Decreased dorsiflexion - right, Decreased dorsiflexion - left       General Gait Details: vc for proximity to RW, used to Rollator so flexed posture is his normal. Able to navigate around obstacles and doorways without assistance  Stairs Stairs: Yes Stairs assistance: Modified independent (Device/Increase time) Stair Management: One rail Right, Sideways, Step to pattern Number of Stairs: 5 General stair comments: pt stepped up and back 1 step x5 to mimic entrance to home, able to complete at a mod I level        Balance Overall balance assessment: Modified Independent Sitting-balance support: No upper extremity supported Sitting balance-Leahy Scale: Good     Standing balance support: Bilateral upper extremity supported, During functional activity Standing balance-Leahy Scale: Fair                               Pertinent Vitals/Pain Pain Assessment  Pain Assessment: No/denies pain    Home Living Family/patient expects to be discharged to:: Private residence Living Arrangements: Spouse/significant other Available Help at Discharge: Family;Personal care attendant;Available PRN/intermittently Type of Home: House Home Access: Stairs to enter Entrance  Stairs-Rails: Right Entrance Stairs-Number of Steps: 5   Home Layout: Two level;Able to live on main level with bedroom/bathroom Home Equipment: Rolling Walker (2 wheels);Shower seat;Grab bars - tub/shower;BSC/3in1;Wheelchair Museum/gallery curator (4 wheels) Additional Comments: no longer has aide to help with iADLs so pt and wife make due    Prior Function Prior Level of Function : Needs assist       Physical Assist : Mobility (physical)     Mobility Comments: uses Rollator       Extremity/Trunk Assessment   Upper Extremity Assessment Upper Extremity Assessment: Generalized weakness;Defer to OT evaluation    Lower Extremity Assessment Lower Extremity Assessment: Generalized weakness       Communication   Communication Communication: No apparent difficulties    Cognition Arousal: Alert Behavior During Therapy: WFL for tasks assessed/performed   PT - Cognitive impairments: No apparent impairments                       PT - Cognition Comments: poor understanding of energy conservation Following commands: Intact       Cueing Cueing Techniques: Verbal cues, Gestural cues     General Comments General comments (skin integrity, edema, etc.): SpO2 with ambulation and stair training 90%O2 on RA, HR in 110s with stair training        Assessment/Plan    PT Assessment Patient needs continued PT services  PT Problem List Decreased strength           PT Goals (Current goals can be found in the Care Plan section)  Acute Rehab PT Goals PT Goal Formulation: With patient Time For Goal Achievement: 03/03/24 Potential to Achieve Goals: Fair    Frequency Min 1X/week        AM-PAC PT 6 Clicks Mobility  Outcome Measure Help needed turning from your back to your side while in a flat bed without using bedrails?: None Help needed moving from lying on your back to sitting on the side of a flat bed without using bedrails?: None Help needed moving to  and from a bed to a chair (including a wheelchair)?: None Help needed standing up from a chair using your arms (e.g., wheelchair or bedside chair)?: None Help needed to walk in hospital room?: None Help needed climbing 3-5 steps with a railing? : A Little 6 Click Score: 23    End of Session Equipment Utilized During Treatment: Gait belt Activity Tolerance: Patient tolerated treatment well Patient left: in chair;with call bell/phone within reach;with chair alarm set Nurse Communication: Mobility status PT Visit Diagnosis: Unsteadiness on feet (R26.81)    Time: 1478-2956 PT Time Calculation (min) (ACUTE ONLY): 32 min   Charges:   PT Evaluation $PT Eval Low Complexity: 1 Low PT Treatments $Gait Training: 8-22 mins PT General Charges $$ ACUTE PT VISIT: 1 Visit         Randen Kauth B. Jewel Mortimer PT, DPT Acute Rehabilitation Services Please use secure chat or  Call Office [phone]   Verlie Glisson [facility] 03/03/2024, 9:54 AM

## 2024-03-03 NOTE — Evaluation (Signed)
 Occupational Therapy Evaluation Patient Details Name: Fernando Hess MRN: [id number] DOB: 02-17-1946 Today's Date: 03/03/2024   History of Present Illness   Patient is 78 y.o. male presented to [facility] 6/17 with 1 week of progressive shortness of breath, thought to be secondary to hypervolemia not improved with increased Lasix  which worsened day of admission. PMH: type 2 diabetes mellitus, CAD status post CABG, chronic HFpEF, CKD 3B, depression, anxiety, dementia, and OSA on CPAP,     Clinical Impressions Pt resting comfortably in recliner, no complaints, no pain, no SOB, feeling better. Pt lives at home with wife, lives on main floor, PLOF sponge bathes, mod I with ADLs using RW for mobility, Pt and wife split IADLs. Pt reports he was been admitted 3X in the last month. Currently Pt appears to be at mod I level for ADLs/mobility, good overall safety awareness. 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 Per chart Pt hypovolemic due to lasix , Pt did not understand when repeatedly informing him. Spoke to Pt about safety awareness with ADLs when feeling SOB or weak, energy conservation techniques. Recommending HHOT follow up to maximize success at home with ADLs and safety, has no current acute OT needs. Pt states he has no DME needs.      If plan is discharge home, recommend the following:   Assistance with cooking/housework;Assist for transportation     Functional Status Assessment   Patient has had a recent decline in their functional status and demonstrates the ability to make significant improvements in function in a reasonable and predictable amount of time.     Equipment Recommendations   None recommended by OT     Recommendations for Other Services         Precautions/Restrictions   Precautions Precautions: Fall Recall of Precautions/Restrictions:  Intact Restrictions Weight Bearing Restrictions Per Provider Order: No     Mobility Bed Mobility               General bed mobility comments: in recliner    Transfers Overall transfer level: Modified independent Equipment used: Rolling walker (2 wheels)                      Balance Overall balance assessment: Modified Independent                                         ADL either performed or assessed with clinical judgement   ADL Overall ADL's : At baseline;Modified independent                                       General ADL Comments: no assistance needed, uses RW for mobility     Vision Baseline Vision/History: 0 No visual deficits Ability to See in Adequate Light: 0 Adequate       Perception         Praxis         Pertinent Vitals/Pain Pain Assessment Pain Assessment: No/denies pain     Extremity/Trunk Assessment Upper Extremity Assessment Upper Extremity Assessment: Overall WFL for tasks assessed   Lower Extremity Assessment Lower Extremity Assessment: Defer to PT evaluation       Communication Communication Communication: No apparent  difficulties   Cognition Arousal: Alert Behavior During Therapy: WFL for tasks assessed/performed Cognition: No apparent impairments                               Following commands: Intact       Cueing  General Comments   Cueing Techniques: Verbal cues;Gestural cues  SpO2 with ambulation and stair training 90%O2 on RA, HR in 110s with stair training   Exercises     Shoulder Instructions      Home Living Family/patient expects to be discharged to:: Private residence Living Arrangements: Spouse/significant other Available Help at Discharge: Family;Personal care attendant;Available PRN/intermittently Type of Home: House Home Access: Stairs to enter [employer] of Steps: 5 Entrance Stairs-Rails: Right Home Layout: Two  level;Able to live on main level with bedroom/bathroom     Bathroom Shower/Tub: Tub/shower unit   Bathroom Toilet: Handicapped height Bathroom Accessibility: Yes How Accessible: Accessible via wheelchair Home Equipment: Rolling Walker (2 wheels);Shower seat;Grab bars - tub/shower;BSC/3in1;Wheelchair Museum/gallery curator (4 wheels)   Additional Comments: no longer has aide to help with iADLs so pt and wife make due      Prior Functioning/Environment Prior Level of Function : Needs assist       Physical Assist : Mobility (physical)     Mobility Comments: uses Rollator ADLs Comments: Sponge Bathes, denies need for tub bench.    OT Problem List: Decreased strength;Decreased activity tolerance   OT Treatment/Interventions:        OT Goals(Current goals can be found in the care plan section)   Acute Rehab OT Goals Patient Stated Goal: to manage consistency with ADLs at home OT Goal Formulation: With patient Time For Goal Achievement: 03/17/24 Potential to Achieve Goals: Good   OT Frequency:       Co-evaluation              AM-PAC OT 6 Clicks Daily Activity     Outcome Measure Help from another person eating meals?: None Help from another person taking care of personal grooming?: None Help from another person toileting, which includes using toliet, bedpan, or urinal?: None Help from another person bathing (including washing, rinsing, drying)?: None Help from another person to put on and taking off regular upper body clothing?: None Help from another person to put on and taking off regular lower body clothing?: None 6 Click Score: 24   End of Session Equipment Utilized During Treatment: Gait belt;Rolling walker (2 wheels) Nurse Communication: Mobility status  Activity Tolerance: Patient tolerated treatment well Patient left: in chair;with call bell/phone within reach;with chair alarm set  OT Visit Diagnosis: Muscle weakness (generalized) (M62.81)                 Time: 0272-5366 OT Time Calculation (min): 20 min Charges:  OT General Charges $OT Visit: 1 Visit OT Evaluation $OT Eval Low Complexity: 1 Low  [address], OTR/L   Scherry Curtis 03/03/2024, 11:48 AM

## 2024-03-03 NOTE — TOC Progression Note (Signed)
 Transition of Care [facility]) - Progression Note    Patient Details  Name: Fernando Hess MRN: [id number] Date of Birth: 05-25-1946  Transition of Care [facility]) CM/SW Contact  Dane Dung, RN Phone Number: 03/03/2024, 12:08 PM  Clinical Narrative:    CM met with the patient at the bedside to discuss TOC needs.  The patient admitted to the hospital for volume overload and plans to return home with wife by car when medically stable for discharge.  Medicare choice regarding home health agency was presented to the patient and he did not have a preference but was pleased with [facility] recently.  [facility] was called and accepted for services for PT.  HH order placed.  Will start will PT services and add OT next week if needed.  No other TOC needs and patient plans to return home by wife's car when ready for discharge.        Expected Discharge Plan and Services                                               Social Determinants of Health (SDOH) Interventions SDOH Screenings   Food Insecurity: No Food Insecurity (03/02/2024)  Housing: Low Risk  (03/02/2024)  Transportation Needs: No Transportation Needs (03/02/2024)  Utilities: Not At Risk (03/02/2024)  Depression (PHQ2-9): Low Risk  (05/20/2019)  Social Connections: Unknown (03/02/2024)  Recent Concern: Social Connections - Socially Isolated (02/10/2024)  Tobacco Use: Low Risk  (03/02/2024)    Readmission Risk Interventions    02/10/2024    3:26 PM  Readmission Risk Prevention Plan  Transportation Screening Complete  PCP or Specialist Appt within 3-5 Days Complete  HRI or Home Care Consult Complete  Palliative Care Screening Not Applicable  Medication Review (RN Care Manager) Complete

## 2024-03-03 NOTE — Progress Notes (Addendum)
 Transition of Care [facility]) - Inpatient Brief Assessment   Patient Details  Name: Fernando Hess MRN: [id number] Date of Birth: 1946-04-18  [facility]) CM/SW Contact:    Dane Dung, RN Phone Number: 03/03/2024, 3:44 PM   Clinical Narrative: CM spoke with Dr. Gladstone Lamer and patient will be stable to discharge tomorrow morning.  I called and spoke with the patient's wife by phone and she is aware and requests that patient be discharged in the am since she will be unable to pick him up this afternoon.  The wife states that she will not be home from the doctor's office until after 5-6 pm tonight.  I spoke with the wife and offered Medicare choice regarding DME company and the wife plans to call [employer] oxygen company at a later date but was fine that I call another DME company in the meantime to set up patient with noctural oxygen for home prior to his discharge from the hospital.  Noctural oxygen test results are documented in the media.  I called [employer] and he was provided with copies of [employer] oxygen saturation and he plans to deliver oxygen concentrator to the home tomorrow.  Patient does not need portable oxygen tank since he will only be wearing the oxygen at night time.   Wife states that patient does not use his CPAP at nighttime due to his neurological condition.   Patient should discharge home by car tomorrow morning.  Wife requests discharge medications to be called into [facility] at [facility] location.  MD notified.  CM will continue to follow the patient for discharge home tomorrow wiith wife.   Transition of Care Asessment: Insurance and Status: (P) Insurance coverage has been reviewed Patient has primary care physician: (P) Yes Home environment has been reviewed: (P) home with wife Prior level of function:: (P) Rolator Prior/Current Home Services: (P) No current home services (previously active with [facility] health) Social Drivers of Health Review:  (P) SDOH reviewed interventions complete Readmission risk has been reviewed: (P) Yes Transition of care needs: (P) transition of care needs identified, TOC will continue to follow

## 2024-03-03 NOTE — Progress Notes (Signed)
 Nurse requested Mobility Specialist to perform oxygen saturation test with pt which includes removing pt from oxygen both at rest and while ambulating.  Below are the results from that testing.     Patient Saturations on Room Air at Rest = spO2 96%  Patient Saturations on Room Air while Ambulating = sp02 91% .    At end of testing pt left in room on 1  Liters of oxygen.  Reported results to nurse.

## 2024-03-03 NOTE — Progress Notes (Signed)
 Progress Note    Fernando Hess   UJW:[id number]  DOB: 04/15/1946  DOA: 03/01/2024     0 PCP: Jimmey Mould, MD  Initial CC: SOB  Hospital Course: Fernando Hess is a 78 y.o. male with medical history significant for type 2 diabetes mellitus, CAD status post CABG, chronic HFpEF, CKD 3B, depression, anxiety, dementia, and OSA on CPAP, now presenting with 1 week of shortness of breath that worsened yesterday evening.   Patient developed shortness of breath on 02/23/2024 and was seen in the emergency department the same day.  He was suspected to be hypervolemic, was given a dose of IV Lasix , and then doubled his home Lasix  for the following 3 days.  He has continued to feel short of breath since then and worsened yesterday evening.  He denies any chest pain, cough, swelling, fever, or chills. He denies rhinorrhea or sore throat. He has trouble breathing when laying flat but states that he has always had that.    ED Course: Upon arrival to the ED, patient is found to be afebrile and saturating well on room air while at rest with mild tachypnea and stable BP.  EKG demonstrates sinus rhythm with first given node block.  Chest x-ray is negative for acute cardiopulmonary disease.  Labs are most notable for creatinine 2.34, normal WBC, normal troponin, undetectable D-dimer, and BNP 134.   ED PA discussed the situation with neurology, questioning whether the presentation could be related to myasthenia gravis flare, but neurology does not believe that the patient actually has myasthenia gravis based on his prior test results.   Assessment/Plan    SOB  - Etiology unclear. Differential includes MG related? (Seroneg diagnosis per outpt neuro) vs cardiac/CHF vs psychosomatic (underlying dementia) vs apnea/hypoxia vs less likely infectious - last echo is uptodate and does not show reduced EF or severe diastolic failure nor suggestion of elevated right heart pressures (e.g. 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 outpatient with [facility], [name] - chart review from [facility] hospitalization in May 2020 4 mentions ptotic episodes versus concern for Lewy body dementia - because of sero-neg testing, the diagnosis has been questionable; but wife states he's had nerve conduction/EMG testing with [facility] (I cannot locate these in epic) - as above, continue NIF and VC q12h for now  Dementia  - Continue Aricept , use delirium precautions  - well discussed in note review and seems to have some progressive signs and symptoms over the past year at least also   Chronic HFpEF  - Appears compensated  - no need to repeat echo at this time  - Continue Lasix     PAF  - continue Eliquis     Insulin -dependent DM  - A1c was 10.8% in May 2025  - Check CBGs, continue basal insulin , use sliding-scale correctional for now     CAD  - No angina    CKD 3B; borderline stage IV - creat baseline 2 -  2.2 with GFR low 30s (but dipping into upper 20s lately) - will continue trending for now   Depression, anxiety  - Continue Lamictal , Seroquel     OSA  - CPAP while sleeping  Interval History:  Did have desaturation overnight during nocturnal pulse ox study.  Otherwise sitting up in recliner doing okay this morning about to do walking test as well.  Old records reviewed in assessment of this patient  Antimicrobials:   DVT prophylaxis:   apixaban  (ELIQUIS ) tablet 5 mg   Code Status:    Code Status: Full Code  Mobility Assessment (Last 72 Hours)     Mobility Assessment     Row Name 03/03/24 1128 03/03/24 0900 03/03/24 0807 03/02/24 2013 03/02/24 1058   Does patient have an order for bedrest or is patient medically unstable -- -- No - Continue assessment No - Continue assessment --   What is the highest level of mobility based on the progressive mobility assessment? Level 5 (Walks with assist in room/hall) - Balance while stepping forward/back and can walk in room with assist - Complete Level 5 (Walks with assist in room/hall) - Balance while stepping forward/back and can walk in room with assist - Complete Level 3 (Stands with assist) - Balance while standing  and cannot march in place Level 3 (Stands with assist) - Balance while standing  and cannot march in place Level 5 (Walks with assist in room/hall) - Balance while stepping forward/back and can walk in room with assist - Complete   Is the above level different from baseline mobility prior to current illness? -- -- -- Yes - Recommend PT order --    Row Name 03/02/24 0802 03/02/24 0252         Does patient have an order for bedrest or is patient medically unstable No - Continue assessment No - Continue assessment      What is the highest level of mobility based on the progressive mobility assessment? -- Level 4 (Walks with assist in room) - Balance while marching in place and cannot step forward and back - Complete      Is the above level different from baseline mobility prior to current illness? -- No - Consider discontinuing PT/OT         Barriers to discharge:  Disposition Plan: Home HH orders placed:  Status is: Observation  Objective: Blood pressure 117/67, pulse 65, temperature 98 F (36.7 C), temperature source Oral, resp. rate 18, height 5' 6 (1.676 m), weight 83.1 kg, SpO2 96%.  Examination:  Physical Exam Constitutional:      General: He is not in acute distress.    Appearance: Normal appearance.   HENT:     Head: Normocephalic and atraumatic.     Mouth/Throat:     Mouth: Mucous membranes are moist.   Eyes:     Extraocular Movements: Extraocular movements intact.    Cardiovascular:     Rate and Rhythm: Normal rate and regular rhythm.  Pulmonary:     Effort: Pulmonary effort is normal. No respiratory distress.     Breath sounds: Normal breath sounds. No wheezing.  Abdominal:     General: Bowel sounds are normal. There is no distension.     Palpations: Abdomen is soft.     Tenderness: There is no abdominal tenderness.   Musculoskeletal:        General: Normal range of motion.     Cervical back: Normal range of motion and neck supple.   Skin:  General: Skin is warm and dry.   Neurological:     Mental Status: He is alert.     Comments: Underlying cognitive impairment appreciated.  Follows commands and moves all 4 extremities  Psychiatric:        Mood and Affect: Mood normal.        Behavior: Behavior normal.      Consultants:    Procedures:    Data Reviewed: Results for orders placed or performed during the hospital encounter of 03/01/24 (from the past 24 hours)  Glucose, capillary     Status: Abnormal   Collection Time: 03/02/24  5:57 PM  Result Value Ref Range   Glucose-Capillary 188 (H) 70 - 99 mg/dL  Glucose, capillary     Status: Abnormal   Collection Time: 03/02/24  8:39 PM  Result Value Ref Range   Glucose-Capillary 143 (H) 70 - 99 mg/dL  Glucose, capillary     Status: Abnormal   Collection Time: 03/03/24  8:49 AM  Result Value Ref Range   Glucose-Capillary 224 (H) 70 - 99 mg/dL  Glucose, capillary     Status: Abnormal   Collection Time: 03/03/24 12:12 PM  Result Value Ref Range   Glucose-Capillary 187 (H) 70 - 99 mg/dL    I have reviewed pertinent nursing notes, vitals, labs, and images as necessary. I have ordered labwork to follow up on as indicated.  I have reviewed the last notes from staff over past 24 hours. I have discussed patient's  care plan and test results with nursing staff, CM/SW, and other staff as appropriate.  Time spent: Greater than 50% of the 55 minute visit was spent in counseling/coordination of care for the patient as laid out in the A&P.   LOS: 0 days   Faith Homes, MD [facility] 03/03/2024, 4:16 PM

## 2024-03-03 NOTE — Progress Notes (Signed)
 Pt refused NIF/VC at this time, pt c/o SOB. PRN breathing tx given and O2 increased from 1L - 3L.

## 2024-03-04 DIAGNOSIS — G4734 Idiopathic sleep related nonobstructive alveolar hypoventilation: Secondary | ICD-10-CM | POA: Diagnosis not present

## 2024-03-04 DIAGNOSIS — R0602 Shortness of breath: Secondary | ICD-10-CM | POA: Diagnosis not present

## 2024-03-04 LAB — GLUCOSE, CAPILLARY
Glucose-Capillary: 148 mg/dL — ABNORMAL HIGH (ref 70–99)
Glucose-Capillary: 207 mg/dL — ABNORMAL HIGH (ref 70–99)
Glucose-Capillary: 218 mg/dL — ABNORMAL HIGH (ref 70–99)

## 2024-03-04 MED ORDER — POLYETHYLENE GLYCOL 3350 17 GM/SCOOP PO POWD
17.0000 g | ORAL | Status: DC | PRN
Start: 2024-03-04 — End: 2024-06-28

## 2024-03-04 MED ORDER — FUROSEMIDE 40 MG PO TABS: 40.0000 mg | ORAL_TABLET | ORAL | Status: AC

## 2024-03-04 MED ORDER — HYDRALAZINE HCL 25 MG PO TABS: 25.0000 mg | ORAL_TABLET | Freq: Every day | ORAL | Status: AC | PRN

## 2024-03-04 NOTE — Plan of Care (Signed)

## 2024-03-04 NOTE — Progress Notes (Signed)
 NIF: -20 VC: 1.42L   With good patient effort.

## 2024-03-04 NOTE — Progress Notes (Signed)
 Repeat NIF done this AM per MD request:  NIF: -40  With excellent patient effort.

## 2024-03-04 NOTE — Progress Notes (Signed)
 Mobility Specialist: Progress Note   03/04/24 1254  Mobility  Activity Ambulated independently in hallway  Level of Assistance Modified independent, requires aide device or extra time  Assistive Device Front wheel walker  Distance Ambulated (ft) 350 ft  Activity Response Tolerated well  Mobility Referral Yes  Mobility visit 1 Mobility  Mobility Specialist Start Time (ACUTE ONLY) 0930  Mobility Specialist Stop Time (ACUTE ONLY) [id number]  Mobility Specialist Time Calculation (min) (ACUTE ONLY) 12 min    Pt received in chair, agreeable to mobility session. No complaints. SpO2 WFL on RA, but pt requested to keep on 2LO2 for comfort. ModI throughout. Returned to room without fault. Left in chair with all needs met, call bell in reach.  Deloria Fetch Mobility Specialist Please contact via SecureChat or Rehab office at [phone]

## 2024-03-04 NOTE — Discharge Summary (Signed)
 Physician Discharge Summary   Fernando Hess ZOX:[id number] DOB: August 20, 1946 DOA: 03/01/2024  PCP: Jimmey Mould, MD  Admit date: 03/01/2024 Discharge date: 03/04/2024  Admitted From: Home Disposition:  Home Discharging physician: Faith Homes, MD Barriers to discharge: none  Recommendations at discharge: Follow up with neurology as scheduled  May end up needing daytime O2 if any further hypoxia develops but multiple walk tests performed with no desats while ambulating on RA   Discharge Condition: stable CODE STATUS: Full  Diet recommendation:  Diet Orders (From admission, onward)     Start     Ordered   03/04/24 0000  Diet - low sodium heart healthy        03/04/24 0926   03/02/24 0102  Diet Heart Room service appropriate? Yes; Fluid consistency: Thin  Diet effective now       Question Answer Comment  Room service appropriate? Yes   Fluid consistency: Thin      03/02/24 0102            Hospital Course: Fernando Hess is a 78 y.o. male with medical history significant for type 2 diabetes mellitus, CAD status post CABG, chronic HFpEF, CKD 3B, depression, anxiety, dementia, and OSA on CPAP, now presenting with 1 week of shortness of breath that worsened yesterday evening.   Patient developed shortness of breath on 02/23/2024 and was seen in the emergency department the same day.  He was suspected to be hypervolemic, was given a dose of IV Lasix , and then doubled his home Lasix  for the following 3 days.  He has continued to feel short of breath since then and worsened yesterday evening.  He denies any chest pain, cough, swelling, fever, or chills. He denies rhinorrhea or sore throat. He has trouble breathing when laying flat but states that he has always had that.    ED Course: Upon arrival to the ED, patient is found to be afebrile and saturating well on room air while at rest with mild tachypnea and stable BP.  EKG demonstrates sinus rhythm with first given node block.   Chest x-ray is negative for acute cardiopulmonary disease.  Labs are most notable for creatinine 2.34, normal WBC, normal troponin, undetectable D-dimer, and BNP 134.   ED PA discussed the situation with neurology, questioning whether the presentation could be related to myasthenia gravis flare, but neurology does not believe that the patient actually has myasthenia gravis based on his prior test results.   Assessment/Plan    SOB  - Etiology unclear. Differential includes MG related? (Seroneg diagnosis per outpt neuro) vs cardiac/CHF vs psychosomatic (underlying dementia) vs apnea/hypoxia vs less likely infectious - last echo is uptodate and does not show reduced EF or severe diastolic failure nor suggestion of elevated right heart pressures (e.g. 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 outpatient with [facility], [name] - chart review from [facility] hospitalization in May 2020 4 mentions ptotic episodes versus concern for Lewy body dementia -  because of sero-neg testing, the diagnosis has been questionable; but wife states he's had nerve conduction/EMG testing with [facility] (I cannot locate these in epic) - NIF > -40 with adequate VCs  Dementia  - Continue Aricept , use delirium precautions  - well discussed in note review and seems to have some progressive signs and symptoms over the past year at least also   Chronic HFpEF  - Appears compensated  - no need to repeat echo at this  time  - Continue Lasix     PAF  - continue Eliquis     Insulin -dependent DM  - A1c was 10.8% in May 2025  - resume home regimen    CAD  - No angina    CKD 3B; borderline stage IV - creat baseline 2 - 2.2 with GFR low 30s (but dipping into upper 20s lately)   Depression, anxiety  - Continue Lamictal , Seroquel     OSA  - CPAP while sleeping; nocturnal O2    The patient's acute and chronic medical conditions were treated accordingly. On day of discharge, patient was felt deemed stable for discharge. Patient/family member advised to call PCP or come back to ER if needed.   Principal Diagnosis: Shortness of breath  Discharge Diagnoses: Active Hospital Problems   Diagnosis Date Noted   Shortness of breath 03/02/2024    Priority: 1.   Nocturnal hypoxia 03/03/2024    Priority: 2.   Paroxysmal atrial fibrillation ([name]) 11/05/2021    Priority: 2.   Essential hypertension     Priority: 3.   (HFpEF) heart failure with preserved ejection fraction ([name]) 11/18/2019    Priority: 4.   Coronary artery disease involving native coronary artery of native heart without angina pectoris 09/24/2015    Priority: 4.   CKD stage 3b, GFR 30-44 ml/min ([name]) 03/21/2022    Priority: 5.   Type 2 diabetes mellitus with hyperlipidemia ([name]) 11/13/2021    Priority: 7.   OSA on CPAP 03/07/2019    Priority: 9.   Dementia without behavioral disturbance ([name]) 02/03/2023    Priority: 11.   Depression with anxiety 12/24/2022    Resolved Hospital Problems  No resolved problems to display.     Discharge Instructions     Diet - low sodium heart healthy   Complete by: As directed    Increase activity slowly   Complete by: As directed       Allergies as of 03/04/2024       Reactions   Cilostazol  Swelling, Other (See Comments)   Edema   Magnesium -containing Compounds Other (See Comments)   Avoid magnesium  with hx myasthenia gravis (can worsen muscle weakness and trigger a severe exacerbation)    Donepezil  Other (See Comments)   seizure   Dulaglutide Nausea And Vomiting, Other (See Comments)   TRULICITY   Levofloxacin Hives, Itching, Rash   Liraglutide Other (See Comments)   Severe fatigue & insomnia   Lisinopril Itching, Rash, Cough        Medication List     TAKE these medications    apixaban  5 MG Tabs tablet Commonly known as: ELIQUIS  Take 1 tablet (5 mg total) by mouth 2 (two) times daily.   cyanocobalamin  1000 MCG/ML injection Commonly known as: VITAMIN B12 Inject 1,000 mcg into the muscle once a week. Inject on Saturday   donepezil  10 MG tablet Commonly known as: ARICEPT  Take 5 mg by mouth at bedtime. Take 0.5 tablet (5mg ) by mouth daily for 30 days.   furosemide  40 MG tablet  Commonly known as: LASIX  Take 1 tablet (40 mg total) by mouth 3 (three) times a week. Take one tablet (40mg ) by mouth three times weekly on Monday, Wednesday, and Friday.   hydrALAZINE  25 MG tablet Commonly known as: APRESOLINE  Take 1 tablet (25 mg total) by mouth daily as needed (Hypertension). Take one tablet (25mg ) by mouth daily as needed if SBP is >160   insulin  lispro 100 UNIT/ML KwikPen Commonly known as: HUMALOG  Inject 8 Units into the skin 3 (three) times daily before meals.   lamoTRIgine  25 MG tablet Commonly known as: LAMICTAL  Take 25 mg by mouth daily.   Lantus  SoloStar 100 UNIT/ML Solostar Pen Generic drug: insulin  glargine Inject 36 Units into the skin daily before breakfast.   latanoprost 0.005 % ophthalmic solution Commonly known as: XALATAN Place 1 drop into both eyes at bedtime.   Mounjaro 2.5 MG/0.5ML Pen Generic drug: tirzepatide Inject 2.5 mg into the skin once a week. Inject on Tuesday   Myrbetriq  25 MG Tb24 tablet Generic drug: mirabegron  ER Take 25 mg by mouth at bedtime as needed (bladder control).   pantoprazole  40 MG tablet Commonly known as: PROTONIX  TAKE 1 TABLET BY MOUTH EVERY DAY   polyethylene glycol powder 17 GM/SCOOP powder Commonly  known as: GLYCOLAX /MIRALAX  Take 17 g by mouth as needed for mild constipation or moderate constipation.   QUEtiapine  50 MG tablet Commonly known as: SEROQUEL  Take 50 mg by mouth at bedtime.   tamsulosin  0.4 MG Caps capsule Commonly known as: FLOMAX  Take 0.4 mg by mouth daily.   Tradjenta  5 MG Tabs tablet Generic drug: linagliptin  Take 5 mg by mouth daily.               Durable Medical Equipment  (From admission, onward)           Start     Ordered   03/03/24 1518  For home use only DME oxygen  Once       Question Answer Comment  Length of Need Lifetime   Mode or (Route) Nasal cannula   Liters per Minute 2   Frequency Only at night (stationary unit needed)   Oxygen conserving device Yes   Oxygen delivery system Gas      03/03/24 1517            Follow-up Information     [facility] Follow [facility].   Why: [facility] will provide home health services for PT.  They will call you in the next 24-48 hours to set up services. Contact information: [address] [name] [address] [phone]         [facility] Follow up.   Why: [employer] will provide home oxygen for nighttime use. Contact information: [address] [place] [address] [phone]                Allergies  Allergen Reactions   Cilostazol  Swelling and Other (See Comments)    Edema    Magnesium -Containing Compounds Other (See Comments)    Avoid magnesium  with hx myasthenia gravis (can worsen muscle weakness and trigger a severe exacerbation)   Donepezil  Other (See Comments)    seizure   Dulaglutide Nausea And Vomiting and Other (See Comments)    TRULICITY   Levofloxacin Hives, Itching and Rash   Liraglutide Other (See Comments)    Severe fatigue & insomnia   Lisinopril Itching, Rash and Cough    Consultations:   Procedures:   Discharge Exam: BP 127/77 (BP  Location: Left Arm)   Pulse 73   Temp 97.6 F (36.4 C)   Resp 18   Ht 5' 6 (1.676 m)    Wt 83.1 kg   SpO2 94%   BMI 29.57 kg/m  Physical Exam Constitutional:      General: He is not in acute distress.    Appearance: Normal appearance.  HENT:     Head: Normocephalic and atraumatic.     Mouth/Throat:     Mouth: Mucous membranes are moist.   Eyes:     Extraocular Movements: Extraocular movements intact.    Cardiovascular:     Rate and Rhythm: Normal rate and regular rhythm.  Pulmonary:     Effort: Pulmonary effort is normal. No respiratory distress.     Breath sounds: Normal breath sounds. No wheezing.  Abdominal:     General: Bowel sounds are normal. There is no distension.     Palpations: Abdomen is soft.     Tenderness: There is no abdominal tenderness.   Musculoskeletal:        General: Normal range of motion.     Cervical back: Normal range of motion and neck supple.   Skin:    General: Skin is warm and dry.   Neurological:     Mental Status: He is alert.     Comments: Underlying cognitive impairment appreciated.  Follows commands and moves all 4 extremities  Psychiatric:        Mood and Affect: Mood normal.        Behavior: Behavior normal.      The results of significant diagnostics from this hospitalization (including imaging, microbiology, ancillary and laboratory) are listed below for reference.   Microbiology: No results found for this or any previous visit (from the past 240 hours).   Labs: BNP (last 3 results) Recent Labs    02/09/24 2204 02/23/24 2238 03/01/24 2308  BNP 142.0* 164.9* 133.6*   Basic Metabolic Panel: Recent Labs  Lab 03/01/24 2054 03/02/24 0006 03/02/24 0117  NA 138 141 141  K 3.8 3.5 3.6  CL 107  --  109  CO2 21*  --  22  GLUCOSE 174*  --  147*  BUN 30*  --  32*  CREATININE 2.34*  --  2.38*  CALCIUM  8.8*  --  8.8*   Liver Function Tests: No results for input(s): AST, ALT, ALKPHOS, BILITOT, PROT, ALBUMIN  in the last 168 hours. No results for input(s): LIPASE, AMYLASE in the last 168  hours. No results for input(s): AMMONIA in the last 168 hours. CBC: Recent Labs  Lab 03/01/24 2054 03/02/24 0006 03/02/24 0117  WBC 10.3  --  10.9*  HGB 12.4* 11.9* 12.5*  HCT 39.3 35.0* 39.2  MCV 86.2  --  86.3  PLT 256  --  242   Cardiac Enzymes: No results for input(s): CKTOTAL, CKMB, CKMBINDEX, TROPONINI in the last 168 hours. BNP: Invalid input(s): POCBNP CBG: Recent Labs  Lab 03/03/24 1721 03/03/24 2135 03/04/24 0737 03/04/24 1211 03/04/24 1255  GLUCAP 161* 177* 148* 207* 218*   D-Dimer Recent Labs    03/01/24 2308  DDIMER <0.27   Hgb A1c No results for input(s): HGBA1C in the last 72 hours. Lipid Profile No results for input(s): CHOL, HDL, LDLCALC, TRIG, CHOLHDL, LDLDIRECT in the last 72 hours. Thyroid  function studies No results for input(s): TSH, T4TOTAL, T3FREE, THYROIDAB in the last 72 hours.  Invalid input(s): FREET3 Anemia work up No results for input(s): VITAMINB12, FOLATE, FERRITIN, TIBC, IRON, RETICCTPCT  in the last 72 hours. Urinalysis    Component Value Date/Time   COLORURINE YELLOW 07/15/2023 2338   APPEARANCEUR CLEAR 07/15/2023 2338   LABSPEC 1.026 07/15/2023 2338   PHURINE 5.0 07/15/2023 2338   GLUCOSEU NEGATIVE 07/15/2023 2338   HGBUR MODERATE (A) 07/15/2023 2338   BILIRUBINUR NEGATIVE 07/15/2023 2338   KETONESUR NEGATIVE 07/15/2023 2338   PROTEINUR 100 (A) 07/15/2023 2338   UROBILINOGEN 0.2 10/02/2009 1604   NITRITE NEGATIVE 07/15/2023 2338   LEUKOCYTESUR NEGATIVE 07/15/2023 2338   Sepsis Labs Recent Labs  Lab 03/01/24 2054 03/02/24 0117  WBC 10.3 10.9*   Microbiology No results found for this or any previous visit (from the past 240 hours).  Procedures/Studies: CT CHEST WO CONTRAST Result Date: 03/02/2024 CLINICAL DATA:  Shortness of breath.  Known left pelvic kidney. EXAM: CT CHEST WITHOUT CONTRAST TECHNIQUE: Multidetector CT imaging of the chest was performed following the  standard protocol without IV contrast. RADIATION DOSE REDUCTION: This exam was performed according to the departmental dose-optimization program which includes automated exposure control, adjustment of the mA and/or kV according to patient size and/or use of iterative reconstruction technique. COMPARISON:  09/12/2023. Chest radiographs dated 03/01/2024 and 02/23/2024. FINDINGS: Cardiovascular: Atheromatous calcifications, including the coronary arteries and aorta. Stable post CABG changes. Stable enlarged heart with left atrial enlargement. No pericardial effusion. Mediastinum/Nodes: No enlarged mediastinal or axillary lymph nodes. Thyroid  gland, trachea, and esophagus demonstrate no significant findings. Lungs/Pleura: Decreased patchy ground-glass density in the left upper lobe. Mild left and minimal right dependent atelectasis. Stable large left diaphragmatic eventration. Upper Abdomen: The left pelvic kidney is not included. 4 mm mid right renal calculus without hydronephrosis. Musculoskeletal: Thoracic spine degenerative changes. IMPRESSION: 1. Decreased patchy ground-glass density in the left upper lobe, compatible with improving infection/inflammation. 2. Mild left and minimal right dependent atelectasis. 3. Stable cardiomegaly with left atrial enlargement. 4. 4 mm nonobstructing mid right renal calculus. 5. Calcific aortic and coronary artery atherosclerosis (ICD10-I70.0). Aortic Atherosclerosis (ICD10-I70.0). Electronically Signed   By: Catherin Closs M.D.   On: 03/02/2024 15:11   DG Chest Portable 1 View Result Date: 03/01/2024 EXAM: 1 VIEW XRAY OF THE CHEST 03/01/2024 09:29:00 PM COMPARISON: 02/23/2024 CLINICAL HISTORY: SOB. Encounter for shortness of breath FINDINGS: LUNGS AND PLEURA: No focal pulmonary opacity. No pulmonary edema. No pleural effusion. No pneumothorax. Skin fold in the left apex simulating a pneumothorax. HEART AND MEDIASTINUM: Postsurgical changes related to prior CABG. Median  sternotomy. BONES AND SOFT TISSUES: No acute osseous abnormality. IMPRESSION: 1. No acute process. Electronically signed by: Zadie Herter MD 03/01/2024 09:33 PM EDT RP Workstation: [id number]   DG Chest 2 View Result Date: 02/23/2024 CLINICAL DATA:  Shortness of breath EXAM: CHEST - 2 VIEW COMPARISON:  02/09/2024 FINDINGS: Prior CABG. Heart and mediastinal contours are within normal limits. No focal opacities or effusions. No acute bony abnormality. IMPRESSION: No active cardiopulmonary disease. Electronically Signed   By: Janeece Mechanic M.D.   On: 02/23/2024 23:15   ECHOCARDIOGRAM COMPLETE Result Date: 02/10/2024    ECHOCARDIOGRAM REPORT   Patient Name:   SAAJAN WILLMON [place] Date of Exam: 02/10/2024 Medical Rec #:  [id number]      Height:       66.0 in Accession #:    [id number]     Weight:       190.0 lb Date of Birth:  11-25-1945      BSA:          1.957 m Patient Age:    32 years  BP:           166/94 mmHg Patient Gender: M              HR:           76 bpm. Exam Location:  Inpatient Procedure: 2D Echo, Cardiac Doppler and Color Doppler (Both Spectral and Color            Flow Doppler were utilized during procedure). Indications:    Aortic Stenosis I35.0  History:        Patient has prior history of Echocardiogram examinations, most                 recent 09/12/2023. CHF, CAD, Prior CABG,                 Signs/Symptoms:Shortness of Breath and Dyspnea; Risk                 Factors:Hypertension, Dyslipidemia and Diabetes.  Sonographer:    Kip Peon RDCS Referring Phys: [id number] [facility]  Sonographer Comments: Image acquisition challenging due to respiratory motion. IMPRESSIONS  1. Moderate septal hypertrophy and severe hypertrophy of the mid inferior myocardium (1.8 cm). Left ventricular ejection fraction, by estimation, is 60 to 65%. The left ventricle has normal function. The left ventricle has no regional wall motion abnormalities. There is moderate asymmetric left ventricular hypertrophy. Left  ventricular diastolic parameters are consistent with Grade I diastolic dysfunction (impaired relaxation).  2. Right ventricular systolic function is normal. The right ventricular size is normal. There is normal pulmonary artery systolic pressure.  3. Left atrial size was mildly dilated.  4. The mitral valve is normal in structure. Trivial mitral valve regurgitation. No evidence of mitral stenosis.  5. The aortic valve is calcified. There is moderate calcification of the aortic valve. There is moderate thickening of the aortic valve. Aortic valve regurgitation is mild. No aortic stenosis is present.  6. The inferior vena cava is normal in size with greater than 50% respiratory variability, suggesting right atrial pressure of 3 mmHg. FINDINGS  Left Ventricle: Moderate septal hypertrophy and severe hypertrophy of the mid inferior myocardium (1.8 cm). Left ventricular ejection fraction, by estimation, is 60 to 65%. The left ventricle has normal function. The left ventricle has no regional wall motion abnormalities. The left ventricular internal cavity size was normal in size. There is moderate asymmetric left ventricular hypertrophy. Left ventricular diastolic parameters are consistent with Grade I diastolic dysfunction (impaired relaxation). Indeterminate filling pressures. Right Ventricle: The right ventricular size is normal. No increase in right ventricular wall thickness. Right ventricular systolic function is normal. There is normal pulmonary artery systolic pressure. The tricuspid regurgitant velocity is 2.04 m/s, and  with an assumed right atrial pressure of 3 mmHg, the estimated right ventricular systolic pressure is 19.6 mmHg. Left [facility]: Left atrial size was mildly dilated. Right [facility]: Right atrial size was normal in size. Pericardium: There is no evidence of pericardial effusion. Mitral Valve: The mitral valve is normal in structure. Mild mitral annular calcification. Trivial mitral valve regurgitation.  No evidence of mitral valve stenosis. Tricuspid Valve: The tricuspid valve is normal in structure. Tricuspid valve regurgitation is trivial. No evidence of tricuspid stenosis. Aortic Valve: The aortic valve is calcified. There is moderate calcification of the aortic valve. There is moderate thickening of the aortic valve. Aortic valve regurgitation is mild. No aortic stenosis is present. Aortic valve mean gradient measures 9.0  mmHg. Aortic valve peak gradient measures 18.0 mmHg. Aortic valve area,  by VTI measures 1.12 cm. Pulmonic Valve: The pulmonic valve was normal in structure. Pulmonic valve regurgitation is not visualized. No evidence of pulmonic stenosis. Aorta: The aortic root is normal in size and structure. Venous: The inferior vena cava is normal in size with greater than 50% respiratory variability, suggesting right atrial pressure of 3 mmHg. IAS/Shunts: No atrial level shunt detected by color flow Doppler.  LEFT VENTRICLE PLAX 2D LVIDd:         4.40 cm   Diastology LVIDs:         2.60 cm   LV e' medial:    5.98 cm/s LV PW:         1.10 cm   LV E/e' medial:  11.2 LV IVS:        1.40 cm   LV e' lateral:   11.50 cm/s LVOT diam:     2.00 cm   LV E/e' lateral: 5.8 LV SV:         44 LV SV Index:   22 LVOT Area:     3.14 cm  RIGHT VENTRICLE            IVC RV S prime:     9.36 cm/s  IVC diam: 1.30 cm TAPSE (M-mode): 1.2 cm LEFT ATRIUM             Index LA diam:        4.10 cm 2.10 cm/m LA Vol (A2C):   40.2 ml 20.55 ml/m LA Vol (A4C):   59.4 ml 30.36 ml/m LA Biplane Vol: 51.5 ml 26.32 ml/m  AORTIC VALVE AV Area (Vmax):    1.27 cm AV Area (Vmean):   1.35 cm AV Area (VTI):     1.12 cm AV Vmax:           212.00 cm/s AV Vmean:          136.000 cm/s AV VTI:            0.390 m AV Peak Grad:      18.0 mmHg AV Mean Grad:      9.0 mmHg LVOT Vmax:         85.60 cm/s LVOT Vmean:        58.300 cm/s LVOT VTI:          0.139 m LVOT/AV VTI ratio: 0.36  AORTA Ao Root diam: 3.20 cm Ao Asc diam:  3.60 cm MITRAL VALVE                 TRICUSPID VALVE MV Area (PHT): 3.42 cm     TR Peak grad:   16.6 mmHg MV Decel Time: 222 msec     TR Vmax:        204.00 cm/s MV E velocity: 67.10 cm/s MV A velocity: 127.00 cm/s  SHUNTS MV E/A ratio:  0.53         Systemic VTI:  0.14 m                             Systemic Diam: 2.00 cm Maudine Sos MD Electronically signed by Maudine Sos MD Signature Date/Time: 02/10/2024/12:00:34 PM    Final    DG Abd 1 View Result Date: 02/10/2024 CLINICAL DATA:  Abdominal distension. EXAM: ABDOMEN - 1 VIEW COMPARISON:  January 08, 2023 FINDINGS: Partially aerated loops of large and small bowel are seen throughout the abdomen, with a moderate to large stool burden noted within the ascending colon. An  adjacent air-filled loop of prominent small bowel is suspected within the right abdomen. No radio-opaque calculi or other significant radiographic abnormality are seen. An intact total right hip replacement is noted. IMPRESSION: 1. Moderate to large stool burden within the ascending colon, with additional findings that may represent sequelae associated a partial small bowel obstruction versus ileus. Abdomen and pelvis CT correlation is recommended. 2. Total right hip replacement. Electronically Signed   By: Virgle Grime M.D.   On: 02/10/2024 02:53   DG Chest 2 View Result Date: 02/09/2024 CLINICAL DATA:  Shortness of breath.  Chest pain. EXAM: CHEST - 2 VIEW COMPARISON:  09/28/2023 FINDINGS: Prior median sternotomy. Lung volumes are low. The heart is borderline enlarged. Mild vascular congestion. No pleural effusion or pneumothorax. Stable wedging of lower thoracic vertebra. IMPRESSION: Borderline cardiomegaly with mild vascular congestion. Electronically Signed   By: Chadwick Colonel M.D.   On: 02/09/2024 22:21     Time coordinating discharge: Over 30 minutes    Faith Homes, MD  [facility] 03/04/2024, 3:57 PM

## 2024-03-06 ENCOUNTER — Emergency Department (HOSPITAL_COMMUNITY)
Admission: EM | Admit: 2024-03-06 | Discharge: 2024-03-06 | Disposition: A | Discharge status: Home / Self Care | Attending: Emergency Medicine | Admitting: Emergency Medicine

## 2024-03-06 ENCOUNTER — Other Ambulatory Visit: Payer: Self-pay

## 2024-03-06 ENCOUNTER — Emergency Department (HOSPITAL_COMMUNITY)

## 2024-03-06 ENCOUNTER — Encounter (HOSPITAL_COMMUNITY): Payer: Self-pay | Admitting: *Deleted

## 2024-03-06 DIAGNOSIS — E1122 Type 2 diabetes mellitus with diabetic chronic kidney disease: Secondary | ICD-10-CM | POA: Insufficient documentation

## 2024-03-06 DIAGNOSIS — D72829 Elevated white blood cell count, unspecified: Secondary | ICD-10-CM | POA: Insufficient documentation

## 2024-03-06 DIAGNOSIS — Z79899 Other long term (current) drug therapy: Secondary | ICD-10-CM | POA: Diagnosis not present

## 2024-03-06 DIAGNOSIS — Z794 Long term (current) use of insulin: Secondary | ICD-10-CM | POA: Insufficient documentation

## 2024-03-06 DIAGNOSIS — I2510 Atherosclerotic heart disease of native coronary artery without angina pectoris: Secondary | ICD-10-CM | POA: Diagnosis not present

## 2024-03-06 DIAGNOSIS — E1165 Type 2 diabetes mellitus with hyperglycemia: Secondary | ICD-10-CM | POA: Insufficient documentation

## 2024-03-06 DIAGNOSIS — R197 Diarrhea, unspecified: Secondary | ICD-10-CM | POA: Diagnosis not present

## 2024-03-06 DIAGNOSIS — Z7901 Long term (current) use of anticoagulants: Secondary | ICD-10-CM | POA: Insufficient documentation

## 2024-03-06 DIAGNOSIS — I509 Heart failure, unspecified: Secondary | ICD-10-CM | POA: Diagnosis not present

## 2024-03-06 DIAGNOSIS — I130 Hypertensive heart and chronic kidney disease with heart failure and stage 1 through stage 4 chronic kidney disease, or unspecified chronic kidney disease: Secondary | ICD-10-CM | POA: Insufficient documentation

## 2024-03-06 DIAGNOSIS — N189 Chronic kidney disease, unspecified: Secondary | ICD-10-CM | POA: Insufficient documentation

## 2024-03-06 DIAGNOSIS — R112 Nausea with vomiting, unspecified: Secondary | ICD-10-CM | POA: Insufficient documentation

## 2024-03-06 DIAGNOSIS — F0390 Unspecified dementia without behavioral disturbance: Secondary | ICD-10-CM | POA: Diagnosis not present

## 2024-03-06 LAB — URINALYSIS, ROUTINE W REFLEX MICROSCOPIC
Bacteria, UA: NONE SEEN
Bilirubin Urine: NEGATIVE
Glucose, UA: NEGATIVE mg/dL
Hgb urine dipstick: NEGATIVE
Ketones, ur: 5 mg/dL — AB
Nitrite: NEGATIVE
Protein, ur: 30 mg/dL — AB
Specific Gravity, Urine: 1.023 (ref 1.005–1.030)
pH: 5 (ref 5.0–8.0)

## 2024-03-06 LAB — SALICYLATE LEVEL: Salicylate Lvl: <7 mg/dL — ABNORMAL LOW (ref 7.0–30.0)

## 2024-03-06 LAB — COMPREHENSIVE METABOLIC PANEL WITH GFR
ALT: 13 U/L (ref 0–44)
AST: 16 U/L (ref 15–41)
Albumin: 3 g/dL — ABNORMAL LOW (ref 3.5–5.0)
Alkaline Phosphatase: 102 U/L (ref 38–126)
Anion gap: 19 — ABNORMAL HIGH (ref 5–15)
BUN: 44 mg/dL — ABNORMAL HIGH (ref 8–23)
CO2: 20 mmol/L — ABNORMAL LOW (ref 22–32)
Calcium: 8.4 mg/dL — ABNORMAL LOW (ref 8.9–10.3)
Chloride: 103 mmol/L (ref 98–111)
Creatinine, Ser: 2.63 mg/dL — ABNORMAL HIGH (ref 0.61–1.24)
GFR, Estimated: 24 mL/min — ABNORMAL LOW (ref >60)
Glucose, Bld: 212 mg/dL — ABNORMAL HIGH (ref 70–99)
Potassium: 3.8 mmol/L (ref 3.5–5.1)
Sodium: 142 mmol/L (ref 135–145)
Total Bilirubin: 0.9 mg/dL (ref 0.0–1.2)
Total Protein: 6.2 g/dL — ABNORMAL LOW (ref 6.5–8.1)

## 2024-03-06 LAB — LIPASE, BLOOD: Lipase: 22 U/L (ref 11–51)

## 2024-03-06 LAB — CBC WITH DIFFERENTIAL/PLATELET
Abs Immature Granulocytes: 0.12 10*3/uL — ABNORMAL HIGH (ref 0.00–0.07)
Basophils Absolute: 0 10*3/uL (ref 0.0–0.1)
Basophils Relative: 0 %
Eosinophils Absolute: 0.4 10*3/uL (ref 0.0–0.5)
Eosinophils Relative: 2 %
HCT: 47.2 % (ref 39.0–52.0)
Hemoglobin: 14.7 g/dL (ref 13.0–17.0)
Immature Granulocytes: 1 %
Lymphocytes Relative: 8 %
Lymphs Abs: 1.6 10*3/uL (ref 0.7–4.0)
MCH: 27.3 pg (ref 26.0–34.0)
MCHC: 31.1 g/dL (ref 30.0–36.0)
MCV: 87.6 fL (ref 80.0–100.0)
Monocytes Absolute: 1.5 10*3/uL — ABNORMAL HIGH (ref 0.1–1.0)
Monocytes Relative: 7 %
Neutro Abs: 16.7 10*3/uL — ABNORMAL HIGH (ref 1.7–7.7)
Neutrophils Relative %: 82 %
Platelets: 261 10*3/uL (ref 150–400)
RBC: 5.39 MIL/uL (ref 4.22–5.81)
RDW: 15.1 % (ref 11.5–15.5)
WBC: 20.3 10*3/uL — ABNORMAL HIGH (ref 4.0–10.5)
nRBC: 0 % (ref 0.0–0.2)

## 2024-03-06 LAB — BETA-HYDROXYBUTYRIC ACID: Beta-Hydroxybutyric Acid: 1.13 mmol/L — ABNORMAL HIGH (ref 0.05–0.27)

## 2024-03-06 LAB — TROPONIN I (HIGH SENSITIVITY)
Troponin I (High Sensitivity): 14 ng/L (ref <18)
Troponin I (High Sensitivity): 12 ng/L (ref <18)

## 2024-03-06 LAB — MAGNESIUM: Magnesium: 1.9 mg/dL (ref 1.7–2.4)

## 2024-03-06 LAB — LACTIC ACID, PLASMA: Lactic Acid, Venous: 1.2 mmol/L (ref 0.5–1.9)

## 2024-03-06 LAB — ACETAMINOPHEN LEVEL: Acetaminophen (Tylenol), Serum: <10 ug/mL — ABNORMAL LOW (ref 10–30)

## 2024-03-06 LAB — BRAIN NATRIURETIC PEPTIDE: B Natriuretic Peptide: 98.3 pg/mL (ref 0.0–100.0)

## 2024-03-06 MED ORDER — ONDANSETRON 4 MG PO TBDP: 4.0000 mg | ORAL_TABLET | Freq: Three times a day (TID) | ORAL | 0 refills | Status: AC | PRN

## 2024-03-06 MED ORDER — METOCLOPRAMIDE HCL 5 MG/ML IJ SOLN
10.0000 mg | Freq: Once | INTRAMUSCULAR | Status: AC
  Administered 2024-03-06: 10 mg via INTRAVENOUS
  Filled 2024-03-06: qty 2

## 2024-03-06 MED ORDER — LACTATED RINGERS IV BOLUS
250.0000 mL | Freq: Once | INTRAVENOUS | Status: AC
  Administered 2024-03-06: 250 mL via INTRAVENOUS

## 2024-03-06 MED ORDER — ONDANSETRON 4 MG PO TBDP
4.0000 mg | ORAL_TABLET | Freq: Once | ORAL | Status: AC
  Administered 2024-03-06: 4 mg via ORAL
  Filled 2024-03-06: qty 1

## 2024-03-06 MED ORDER — LIDOCAINE VISCOUS HCL 2 % MT SOLN
15.0000 mL | Freq: Once | OROMUCOSAL | Status: AC
  Administered 2024-03-06: 15 mL via ORAL
  Filled 2024-03-06: qty 15

## 2024-03-06 MED ORDER — FAMOTIDINE IN NACL 20-0.9 MG/50ML-% IV SOLN
20.0000 mg | Freq: Once | INTRAVENOUS | Status: AC
  Administered 2024-03-06: 20 mg via INTRAVENOUS
  Filled 2024-03-06: qty 50

## 2024-03-06 NOTE — Discharge Instructions (Addendum)
 A prescription for a medication called ondansetron  was sent to your pharmacy.  Take as needed for nausea.  Return to the emergency department for any new or worsening symptoms of concern.

## 2024-03-06 NOTE — ED Triage Notes (Signed)
 Patient presents to ed via [facility] c/o nausea and vomiting onset 4am , last vomited 6am , states he was discharged from the hospital  on Friday after being here for 1 week with sob. Ems placed patient on 2 liters o2 for comfort. Denies pain c/o nausea only now.

## 2024-03-06 NOTE — ED Provider Notes (Signed)
 [place] EMERGENCY DEPARTMENT AT [facility] Provider Note   CSN: [id number] Arrival date & time: 03/06/24  1204     Patient presents with: Shortness of Breath   Fernando Hess is a 78 y.o. male.    Shortness of Breath Associated symptoms: vomiting   Patient presenting for nausea and vomiting.  Medical history includes HTN, nephrolithiasis, CAD, OSA, CHF, atrial fibrillation, GERD, CKD, dementia, DM.  5 days ago, he was admitted to hospital for shortness of breath.  Etiology was unclear.  He did undergo diuresis and did seem to improve while in the hospital.  He was discharged 2 days ago.  This morning, he had onset of nausea, vomiting, diarrhea.  This was around 4 AM.  He had 2 episodes of diarrhea and vomiting up until 6 AM.  He endorses ongoing nausea in addition to dizziness.     Prior to Admission medications   Medication Sig Start Date End Date Taking? Authorizing Provider  ondansetron  (ZOFRAN -ODT) 4 MG disintegrating tablet Take 1 tablet (4 mg total) by mouth every 8 (eight) hours as needed. 03/06/24  Yes Melvenia Motto, MD  apixaban  (ELIQUIS ) 5 MG TABS tablet Take 1 tablet (5 mg total) by mouth 2 (two) times daily. 01/01/23   Arlice Reichert, MD  cyanocobalamin  (VITAMIN B12) 1000 MCG/ML injection Inject 1,000 mcg into the muscle once a week. Inject on Saturday    [provider]  donepezil  (ARICEPT ) 10 MG tablet Take 5 mg by mouth at bedtime. Take 0.5 tablet (5mg ) by mouth daily for 30 days.    [provider]  furosemide  (LASIX ) 40 MG tablet Take 1 tablet (40 mg total) by mouth 3 (three) times a week. Take one tablet (40mg ) by mouth three times weekly on Monday, Wednesday, and Friday. 03/04/24   Patsy Lenis, MD  hydrALAZINE  (APRESOLINE ) 25 MG tablet Take 1 tablet (25 mg total) by mouth daily as needed (Hypertension). Take one tablet (25mg ) by mouth daily as needed if SBP is >160 03/04/24   Patsy Lenis, MD  insulin  lispro (HUMALOG ) 100 UNIT/ML KwikPen  Inject 8 Units into the skin 3 (three) times daily before meals. 05/15/23   [provider]  lamoTRIgine  (LAMICTAL ) 25 MG tablet Take 25 mg by mouth daily. 02/18/24   [provider]  LANTUS  SOLOSTAR 100 UNIT/ML Solostar Pen Inject 36 Units into the skin daily before breakfast. 02/19/23   [provider]  latanoprost (XALATAN) 0.005 % ophthalmic solution Place 1 drop into both eyes at bedtime.    [provider]  MYRBETRIQ  25 MG TB24 tablet Take 25 mg by mouth at bedtime as needed (bladder control).    [provider]  pantoprazole  (PROTONIX ) 40 MG tablet TAKE 1 TABLET BY MOUTH EVERY DAY 11/25/19   Abran Norleen SAILOR, MD  polyethylene glycol powder (GLYCOLAX /MIRALAX ) 17 GM/SCOOP powder Take 17 g by mouth as needed for mild constipation or moderate constipation. 03/04/24   Patsy Lenis, MD  QUEtiapine  (SEROQUEL ) 50 MG tablet Take 50 mg by mouth at bedtime. 02/23/23   [provider]  tamsulosin  (FLOMAX ) 0.4 MG CAPS capsule Take 0.4 mg by mouth daily. 01/10/24   [provider]  tirzepatide CLOYDE) 2.5 MG/0.5ML Pen Inject 2.5 mg into the skin once a week. Inject on Tuesday    [provider]  TRADJENTA  5 MG TABS tablet Take 5 mg by mouth daily.    [provider]    Allergies: Cilostazol , Magnesium -containing compounds, Donepezil , Dulaglutide, Levofloxacin, Liraglutide, and Lisinopril  Review of Systems  Respiratory:  Positive for shortness of breath.   Gastrointestinal:  Positive for diarrhea, nausea and vomiting.  Neurological:  Positive for dizziness and weakness (Generalized).  All other systems reviewed and are negative.   Updated Vital Signs BP 129/68   Pulse 75   Temp 98.2 F (36.8 C) (Oral)   Resp (!) 23   Ht 5' 6 (1.676 m)   Wt 83.9 kg   SpO2 98%   BMI 29.86 kg/m   Physical Exam Vitals and nursing note reviewed.  Constitutional:      General: He is not in acute distress.    Appearance: He is  well-developed. He is not ill-appearing, toxic-appearing or diaphoretic.  HENT:     Head: Normocephalic and atraumatic.     Mouth/Throat:     Mouth: Mucous membranes are moist.   Eyes:     Conjunctiva/sclera: Conjunctivae normal.    Cardiovascular:     Rate and Rhythm: Normal rate and regular rhythm.     Heart sounds: No murmur heard. Pulmonary:     Effort: Pulmonary effort is normal. No tachypnea or respiratory distress.     Breath sounds: Normal breath sounds.  Chest:     Chest wall: No tenderness.  Abdominal:     Palpations: Abdomen is soft.     Tenderness: There is no abdominal tenderness.   Musculoskeletal:        General: No swelling. Normal range of motion.     Cervical back: Normal range of motion and neck supple.   Skin:    General: Skin is warm and dry.     Coloration: Skin is not cyanotic or pale.   Neurological:     Mental Status: He is alert.     Cranial Nerves: Cranial nerves 2-12 are intact. No cranial nerve deficit, dysarthria or facial asymmetry.     Sensory: Sensation is intact. No sensory deficit.     Motor: Motor function is intact. No weakness, abnormal muscle tone or pronator drift.     Coordination: Coordination is intact. Finger-Nose-Finger Test normal.   Psychiatric:        Mood and Affect: Mood normal.        Behavior: Behavior normal.     (all labs ordered are listed, but only abnormal results are displayed) Labs Reviewed  COMPREHENSIVE METABOLIC PANEL WITH GFR - Abnormal; Notable for the following components:      Result Value   CO2 20 (*)    Glucose, Bld 212 (*)    BUN 44 (*)    Creatinine, Ser 2.63 (*)    Calcium  8.4 (*)    Total Protein 6.2 (*)    Albumin  3.0 (*)    GFR, Estimated 24 (*)    Anion gap 19 (*)    All other components within normal limits  CBC WITH DIFFERENTIAL/PLATELET - Abnormal; Notable for the following components:   WBC 20.3 (*)    Neutro Abs 16.7 (*)    Monocytes Absolute 1.5 (*)    Abs Immature  Granulocytes 0.12 (*)    All other components within normal limits  URINALYSIS, ROUTINE W REFLEX MICROSCOPIC - Abnormal; Notable for the following components:   APPearance HAZY (*)    Ketones, ur 5 (*)    Protein, ur 30 (*)    Leukocytes,Ua TRACE (*)    All other components within normal limits  BETA-HYDROXYBUTYRIC ACID - Abnormal; Notable for the following components:   Beta-Hydroxybutyric Acid 1.13 (*)  All other components within normal limits  SALICYLATE LEVEL - Abnormal; Notable for the following components:   Salicylate Lvl <7.0 (*)    All other components within normal limits  ACETAMINOPHEN  LEVEL - Abnormal; Notable for the following components:   Acetaminophen  (Tylenol ), Serum <10 (*)    All other components within normal limits  LIPASE, BLOOD  MAGNESIUM   BRAIN NATRIURETIC PEPTIDE  LACTIC ACID, PLASMA  TROPONIN I (HIGH SENSITIVITY)  TROPONIN I (HIGH SENSITIVITY)    EKG: None  Radiology: CT CHEST ABDOMEN PELVIS WO CONTRAST Result Date: 03/06/2024 CLINICAL DATA:  Sepsis. Nausea and vomiting. Recent discharge from hospital. Oxygen requirements. EXAM: CT CHEST, ABDOMEN AND PELVIS WITHOUT CONTRAST TECHNIQUE: Multidetector CT imaging of the chest, abdomen and pelvis was performed following the standard protocol without IV contrast. RADIATION DOSE REDUCTION: This exam was performed according to the departmental dose-optimization program which includes automated exposure control, adjustment of the mA and/or kV according to patient size and/or use of iterative reconstruction technique. COMPARISON:  Chest CT 03/02/2024 FINDINGS: CT CHEST FINDINGS CT CHEST FINDINGS Cardiovascular: Post CABG. No acute abnormality on noncontrast exam. No pericardial fluid Mediastinum/Nodes: No axillary or supraclavicular adenopathy. No mediastinal or hilar adenopathy. No pericardial fluid. Esophagus normal. Is Lungs/Pleura: Mild atelectasis in the medial RIGHT lower lobe. Faint ground-glass density in the  LEFT upper lobe (image 59/4). These pulmonary findings are slightly improved from recent CT scan (03/03/2019). Musculoskeletal: No aggressive osseous lesion. CT ABDOMEN AND PELVIS FINDINGS Hepatobiliary: No focal hepatic lesion. No biliary ductal dilatation. Gallbladder is normal. Common bile duct is normal. Pancreas: Pancreas is normal. No ductal dilatation. No pancreatic inflammation. Spleen: Normal spleen Adrenals/urinary tract: Adrenal glands normal. Small nonobstructing calculus in the RIGHT kidney. RIGHT ureter bladder normal. 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 LEFT ureter is normal. Stomach/Bowel: The stomach, duodenum, and small bowel normal. The colon and rectosigmoid colon are normal. Vascular/Lymphatic: Abdominal aorta is normal caliber with atherosclerotic calcification. There is no retroperitoneal or periportal lymphadenopathy. No pelvic lymphadenopathy. Reproductive: Prostate unremarkable Other: No free fluid. Musculoskeletal: No aggressive osseous lesion. RIGHT hip replacement IMPRESSION: CHEST: 1. No acute findings in the chest. 2. Mild atelectasis in the RIGHT lower lobe. 3. Faint ground-glass density in the LEFT upper lobe is slightly improved from recent CT PELVIS: 1. No acute findings in the abdomen pelvis. 2. LEFT pelvic kidney with nonobstructing calculus. 3.  Aortic Atherosclerosis (ICD10-I70.0). Electronically Signed   By: Jackquline Boxer M.D.   On: 03/06/2024 15:29   DG Abd Acute W/Chest Result Date: 03/06/2024 CLINICAL DATA:  Nausea vomiting. EXAM: DG ABDOMEN ACUTE WITH 1 VIEW CHEST COMPARISON:  03/02/2024, 03/01/2024, 02/10/2024. FINDINGS: There is no evidence of dilated bowel loops or free intraperitoneal air. The stomach is mildly distended with air-fluid levels. Surgical clips are present in the left upper quadrant. Heart size and mediastinal contours are stable. There is atherosclerotic calcification of the aorta. Evidence of prior cardiothoracic  surgery is noted. Lung volumes are low with atelectasis at the lung bases. No effusion or pneumothorax is seen. 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 IMPRESSION: 1. Low lung volumes with atelectasis. 2. Nonobstructive bowel gas pattern. Electronically Signed   By: Leita Birmingham M.D.   On: 03/06/2024 14:01     Procedures   Medications Ordered in the ED  ondansetron  (ZOFRAN -ODT) disintegrating tablet 4 mg (4 mg Oral Given 03/06/24 1246)  lactated ringers  bolus 250 mL (0 mLs Intravenous Stopped 03/06/24 1424)  metoCLOPramide (REGLAN) injection 10  mg (10 mg Intravenous Given 03/06/24 1709)  lidocaine  (XYLOCAINE ) 2 % viscous mouth solution 15 mL (15 mLs Oral Given 03/06/24 1708)  famotidine  (PEPCID ) IVPB 20 mg premix (0 mg Intravenous Stopped 03/06/24 1952)                                    Medical Decision Making Amount and/or Complexity of Data Reviewed Labs: ordered. Radiology: ordered.  Risk Prescription drug management.   This patient presents to the ED for concern of nausea and vomiting, this involves an extensive number of treatment options, and is a complaint that carries with it a high risk of complications and morbidity.  The differential diagnosis includes medication side effect, GERD, enteritis, dehydration, metabolic derangement, infection   Co morbidities / Chronic conditions that complicate the patient evaluation  HTN, nephrolithiasis, CAD, OSA, CHF, atrial fibrillation, GERD, CKD, dementia, DM   Additional history obtained:  Additional history obtained from EMR External records from outside source obtained and reviewed including N/A   Lab Tests:  I Ordered, and personally interpreted labs.  The pertinent results include: Nonspecific leukocytosis is present.  Creatinine and BUN are at baseline.  A mild hyperglycemia is present, but not at a level consistent with DKA.  Anion gap**   Imaging Studies ordered:  I ordered imaging studies including  x-ray of chest and abdomen, CT scan of chest, abdomen, pelvis I independently visualized and interpreted imaging which showed no acute findings I agree with the radiologist interpretation   Cardiac Monitoring: / EKG:  The patient was maintained on a cardiac monitor.  I personally viewed and interpreted the cardiac monitored which showed an underlying rhythm of: Sinus rhythm   Problem List / ED Course / Critical interventions / Medication management  Patient presenting for nausea, vomiting, diarrhea.  Onset was earlier this morning.  On arrival in the ED, vital signs are normal.  Patient is overall well-appearing.  Abdomen is soft and nontender.  He does note ongoing nausea and dizziness.  Workup was initiated.  Zofran  was ordered for symptomatic relief.  Gentle IV fluids ordered given his history of CHF.  Patient's lab work is notable for leukocytosis.  An anion gap is present as well.  Although he has mild hyperglycemia, this is not consistent with DKA.  Additional lab work was ordered to further assess.  Patient underwent imaging studies which did not show any acute findings explain his recent symptoms.  On reassessment, patient is resting comfortably.  His vomiting and diarrhea have subsided.  He does endorse ongoing nausea.  Reglan and GI cocktail were ordered.  On reassessment, patient resting comfortably.  He is eating and drinking without difficulty.  I spoke with his wife over the telephone.  Wife reports that he had a similar episode of nausea and vomiting while in the hospital.  Per chart review, he is not currently prescribed any antiemetics.  Prescription for Zofran  was provided.  Patient's wife is comfortable coming to get him.  Patient was discharged in stable condition. I ordered medication including IV fluids for hydration, Zofran  and Reglan for nausea Reevaluation of the patient after these medicines showed that the patient improved I have reviewed the patients home medicines and have  made adjustments as needed  Social Determinants of Health:  Lives at home with wife, frequent hospitalizations     Final diagnoses:  Nausea vomiting and diarrhea    ED Discharge Orders  Ordered    ondansetron  (ZOFRAN -ODT) 4 MG disintegrating tablet  Every 8 hours PRN        03/06/24 2010               Melvenia Motto, MD 03/06/24 2011

## 2024-03-06 NOTE — ED Provider Triage Note (Signed)
 Emergency Medicine Provider Triage Evaluation Note  Fernando Hess , a 78 y.o. male  was evaluated in triage.  Pt complains of nausea and vomiting.  Review of Systems  Positive: Nausea, vomiting, diarrhea, dizziness Negative: Chest pain, abdominal pain, back pain  Physical Exam  BP 133/81 (BP Location: Left Arm)   Pulse 84   Temp 98 F (36.7 C) (Oral)   Resp 20   Ht 5' 6 (1.676 m)   Wt 83.9 kg   SpO2 100%   BMI 29.86 kg/m  Gen:   Awake, no distress   Resp:  Normal effort  MSK:   Moves extremities without difficulty  Other:  Abdomen is soft and nontender.  Medical Decision Making  Medically screening exam initiated at 12:20 PM.  Appropriate orders placed.  Fernando Hess was informed that the remainder of the evaluation will be completed by another provider, this initial triage assessment does not replace that evaluation, and the importance of remaining in the ED until their evaluation is complete.  Patient discharged from hospital 2 days ago after treatment for hypervolemia with associated shortness of breath.  Earliest morning, he had onset of nausea, vomiting, diarrhea.  Currently, he endorses ongoing nausea and dizziness.  On exam, he is overall well-appearing.  He has no focal neurologic deficits.  Abdomen is soft and nontender.   Fernando Motto, MD 03/06/24 1224

## 2024-03-10 ENCOUNTER — Other Ambulatory Visit (HOSPITAL_COMMUNITY)

## 2024-03-30 ENCOUNTER — Ambulatory Visit: Admitting: Physician Assistant

## 2024-04-08 ENCOUNTER — Encounter: Payer: Self-pay | Admitting: Internal Medicine

## 2024-04-08 ENCOUNTER — Ambulatory Visit: Attending: Internal Medicine | Admitting: Internal Medicine

## 2024-04-08 VITALS — BP 110/68 | HR 70 | Ht 66.0 in | Wt 195.0 lb

## 2024-04-08 DIAGNOSIS — I5032 Chronic diastolic (congestive) heart failure: Secondary | ICD-10-CM | POA: Diagnosis not present

## 2024-04-08 DIAGNOSIS — E785 Hyperlipidemia, unspecified: Secondary | ICD-10-CM | POA: Diagnosis not present

## 2024-04-08 DIAGNOSIS — I480 Paroxysmal atrial fibrillation: Secondary | ICD-10-CM | POA: Diagnosis not present

## 2024-04-08 DIAGNOSIS — I2510 Atherosclerotic heart disease of native coronary artery without angina pectoris: Secondary | ICD-10-CM

## 2024-04-08 DIAGNOSIS — I10 Essential (primary) hypertension: Secondary | ICD-10-CM

## 2024-04-08 MED ORDER — APIXABAN 5 MG PO TABS: 5.0000 mg | ORAL_TABLET | Freq: Two times a day (BID) | ORAL | 3 refills | Status: DC

## 2024-04-08 NOTE — Patient Instructions (Signed)
 Medication Instructions:  Refilled Eliquis  *If you need a refill on your cardiac medications before your next appointment, please call your pharmacy*  Lab Work: NONE If you have labs (blood work) drawn today and your tests are completely normal, you will receive your results only by: MyChart Message (if you have MyChart) OR A paper copy in the mail If you have any lab test that is abnormal or we need to change your treatment, we will call you to review the results.  Testing/Procedures: NONE  Follow-Up: At [facility], you and your health needs are our priority.  As part of our continuing mission to provide you with exceptional heart care, our providers are all part of one team.  This team includes your primary Cardiologist (physician) and Advanced Practice Providers or APPs (Physician Assistants and Nurse Practitioners) who all work together to provide you with the care you need, when you need it.  Your next appointment:   6 month(s)  Provider:   Glendia Ferrier, [name]  We recommend signing up for the patient portal called MyChart.  Sign up information is provided on this After Visit Summary.  MyChart is used to connect with patients for Virtual Visits (Telemedicine).  Patients are able to view lab/test results, encounter notes, upcoming appointments, etc.  Non-urgent messages can be sent to your provider as well.   To learn more about what you can do with MyChart, go to [email or web address].

## 2024-04-08 NOTE — Progress Notes (Signed)
 OFFICE NOTE  Chief Complaint:  Establish cardiologist  Primary Care Physician: Okey Carlin Redbird, MD  HPI:  Fernando Hess is a 78 y.o. male with a past medial history significant for coronary artery disease status post MI in 2017 and subsequent CABG.  This included LIMA to LAD, SVG to OM1 and OM 2 and SVG to RPDA and SVG to diagonal.  Shortly thereafter the SVG to OM1 and SVG to OM 2 were occluded along with the diagonal graft.  He also has a history of heart failure with preserved ejection fraction, mild to moderate aortic stenosis, type 2 diabetes, hypertension, dyslipidemia, dementia and myasthenia gravis as well as peripheral arterial disease.  He was seen today in a wheelchair.  He had short frank answers to questions.  Most of the conversation was with his wife.  He reported being asymptomatic denying chest pain or shortness of breath.  He has had some worsening lower extremity edema.  He only takes Lasix  3 times a week apparently due to worsening renal function on more regular dosing.  He is not on statin therapy due to myasthenia gravis.  He is followed by Dr. Prescilla for his kidneys.  He also has a history of paroxysmal atrial fibrillation and is on Eliquis  for this.  PMHx:  Past Medical History:  Diagnosis Date   Acute renal failure (ARF) ([name]) 09/24/2015   Acute stress disorder 11/13/2021   Atrial premature complexes    CAD (coronary artery disease) of artery bypass graft    Early occlusion of saphenous vein graft to intermediate and marginal branch in February 2007 following bypass grafting    CAD (coronary artery disease), native coronary artery 2017   hx NSTEMI 09-24-2015  s/p  CABG x5 on 10-02-2015;  post op STEMI inferolateral wall,  SVG OM1 and SVG OM2 occluded, distal OM occlusion the calpruit, treated medically // Myoview  7/21: no ischemia, EF 65, low risk   CHF (congestive heart failure) ([name])    CKD (chronic kidney disease), stage III ([name])    Contusion of right  knee 03/16/2020   Dyspnea    Elevated troponin    Erectile dysfunction    Esophageal reflux    History of atrial fibrillation    post op CABG 10-02-2015   History of kidney stones    History of non-ST elevation myocardial infarction (NSTEMI) 09/24/2015   s/p  CABG x5   History of ST elevation myocardial infarction (STEMI) 10/22/2015   inferior wall,  post op CABG 10-02-2015   Hyperlipidemia    Hypertension    Left ureteral stone    Mild atherosclerosis of both carotid arteries    Nephrolithiasis    per CT bilateral non-obstructive calculi   OSA (obstructive sleep apnea)    Peripheral artery disease    LE Arterial US  01/2019: R PTA and ATA occluded; L ATA occluded   RBBB (right bundle branch block)    Renal atrophy, right    Sleep apnea    wears cpap    ST elevation myocardial infarction (STEMI) of inferior wall ([name]) 10/22/2015   Type 2 diabetes mellitus treated with insulin  ([name])    followed by pcp   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 INVASIVE CV LAB;  Service: Cardiovascular;  Laterality: N/A;   CATARACT EXTRACTION W/ INTRAOCULAR LENS  IMPLANT, BILATERAL  2017   COLONOSCOPY     CORONARY ARTERY BYPASS GRAFT N/A 10/02/2015   Procedure: CORONARY ARTERY BYPASS GRAFTING (CABG) X5 LIMA-LAD; SVG-DIAG; SVG-OM; SVG-PD; SVG-RAMUS TRANSESOPHAGEAL ECHOCARDIOGRAM (TEE) ENDOSCOPIC GREATER SAPHENOUS VEIN  HARVEST BILAT LE;  Surgeon: Maude Fleeta Ochoa, MD;  Location: [facility] OR;   Service: Open Heart Surgery;  Laterality: N/A;   CYSTOSCOPY/URETEROSCOPY/HOLMIUM LASER/STENT PLACEMENT Left 08/10/2018   Procedure: CYSTOSCOPY/URETEROSCOPY/HOLMIUM LASER/STENT PLACEMENT;  Surgeon: Nieves Cough, MD;  Location: [facility];  Service: Urology;  Laterality: Left;   CYSTOSCOPY/URETEROSCOPY/HOLMIUM LASER/STENT PLACEMENT Left 09/10/2018   Procedure: CYSTOSCOPY/URETEROSCOPY/HOLMIUM LASER/STENT EXCHANGE;  Surgeon: Nieves Cough, MD;  Location: [facility] ORS;  Service: Urology;  Laterality: Left;   HIP ARTHROPLASTY Right 12/26/2022   Procedure: RIGHT HEMI HIP ARTHROPLASTY;  Surgeon: Edna Toribio DELENA, MD;  Location: [facility] OR;  Service: Orthopedics;  Laterality: Right;   LEFT HEART CATHETERIZATION WITH CORONARY ANGIOGRAM N/A 04/13/2014   Procedure: LEFT HEART CATHETERIZATION WITH CORONARY ANGIOGRAM;  Surgeon: Elsie GORMAN Somerset, MD;  Location: [facility] CATH LAB;  Service: Cardiovascular;  Laterality: N/A;   LEG SURGERY Right age 62   closed reduction leg fracture   NASAL SEPTOPLASTY W/ TURBINOPLASTY Bilateral 08/30/2021   Procedure: NASAL SEPTOPLASTY WITH BILATERAL INFERIOR TURBINATE REDUCTION;  Surgeon: Mable Lenis, MD;  Location: [facility] OR;  Service: ENT;  Laterality: Bilateral;   POLYPECTOMY     RIGHT HEART CATH N/A 06/06/2021   Procedure: RIGHT HEART CATH;  Surgeon: Cherrie Toribio SAUNDERS, MD;  Location: [facility] INVASIVE CV LAB;  Service: Cardiovascular;  Laterality: N/A;   TEE WITHOUT CARDIOVERSION N/A 10/02/2015   Procedure: TRANSESOPHAGEAL ECHOCARDIOGRAM (TEE);  Surgeon: Maude Fleeta Ochoa, MD;  [facility];  Service: Open Heart Surgery;  Laterality: N/A;   URETEROSCOPY WITH HOLMIUM LASER LITHOTRIPSY Bilateral 2004;  2005  dr grapey  @[facility]    VASECTOMY      FAMHx:  Family History  Problem Relation Age of Onset   Heart attack Mother    Heart attack Father    Diabetes Brother    Pancreatic cancer Brother    Diabetes Brother    Colon cancer Neg Hx    Esophageal cancer Neg Hx     Prostate cancer Neg Hx    Rectal cancer Neg Hx    Stomach cancer Neg Hx    Colon polyps Neg Hx     SOCHx:   reports that he has never smoked. He has never used smokeless tobacco. He reports that he does not currently use alcohol . He reports that he does not use drugs.  ALLERGIES:  Allergies  Allergen Reactions   Cilostazol  Swelling and Other (See Comments)    Edema    Magnesium -Containing Compounds Other (See Comments)    Avoid magnesium  with hx myasthenia gravis (can worsen muscle weakness and trigger a severe exacerbation)   Donepezil  Other (See Comments)    seizure   Dulaglutide Nausea And Vomiting and Other (See Comments)    TRULICITY   Levofloxacin Hives, Itching and Rash   Liraglutide Other (See Comments)    Severe fatigue & insomnia   Lisinopril Itching, Rash and Cough    ROS: Pertinent items noted in HPI and remainder  of comprehensive ROS otherwise negative.  HOME MEDS: Current Outpatient Medications on File Prior to Visit  Medication Sig Dispense Refill   apixaban  (ELIQUIS ) 5 MG TABS tablet Take 1 tablet (5 mg total) by mouth 2 (two) times daily. 60 tablet    cyanocobalamin  (VITAMIN B12) 1000 MCG/ML injection Inject 1,000 mcg into the muscle once a week. Inject on Saturday     donepezil  (ARICEPT ) 10 MG tablet Take 5 mg by mouth at bedtime. Take 0.5 tablet (5mg ) by mouth daily for 30 days.     furosemide  (LASIX ) 40 MG tablet Take 1 tablet (40 mg total) by mouth 3 (three) times a week. Take one tablet (40mg ) by mouth three times weekly on Monday, Wednesday, and Friday.     hydrALAZINE  (APRESOLINE ) 25 MG tablet Take 1 tablet (25 mg total) by mouth daily as needed (Hypertension). Take one tablet (25mg ) by mouth daily as needed if SBP is >160     insulin  lispro (HUMALOG ) 100 UNIT/ML KwikPen Inject 8 Units into the skin 3 (three) times daily before meals.     lamoTRIgine  (LAMICTAL ) 25 MG tablet Take 25 mg by mouth daily.     LANTUS  SOLOSTAR 100 UNIT/ML Solostar Pen Inject  36 Units into the skin daily before breakfast.     latanoprost (XALATAN) 0.005 % ophthalmic solution Place 1 drop into both eyes at bedtime.     MYRBETRIQ  25 MG TB24 tablet Take 25 mg by mouth at bedtime as needed (bladder control).     ondansetron  (ZOFRAN -ODT) 4 MG disintegrating tablet Take 1 tablet (4 mg total) by mouth every 8 (eight) hours as needed. 20 tablet 0   pantoprazole  (PROTONIX ) 40 MG tablet TAKE 1 TABLET BY MOUTH EVERY DAY 90 tablet 2   polyethylene glycol powder (GLYCOLAX /MIRALAX ) 17 GM/SCOOP powder Take 17 g by mouth as needed for mild constipation or moderate constipation.     QUEtiapine  (SEROQUEL ) 50 MG tablet Take 50 mg by mouth at bedtime.     tamsulosin  (FLOMAX ) 0.4 MG CAPS capsule Take 0.4 mg by mouth daily.     tirzepatide (MOUNJARO) 2.5 MG/0.5ML Pen Inject 2.5 mg into the skin once a week. Inject on Tuesday     TRADJENTA  5 MG TABS tablet Take 5 mg by mouth daily.     No current facility-administered medications on file prior to visit.    LABS/IMAGING: No results found for this or any previous visit (from the past 48 hours). No results found.  LIPID PANEL:    Component Value Date/Time   CHOL 118 09/18/2022 0023   CHOL 121 01/22/2022 0815   TRIG 97 09/18/2022 0023   HDL 33 (L) 09/18/2022 0023   HDL 42 01/22/2022 0815   CHOLHDL 3.6 09/18/2022 0023   VLDL 19 09/18/2022 0023   LDLCALC 66 09/18/2022 0023   LDLCALC 57 01/22/2022 0815     WEIGHTS: Wt Readings from Last 3 Encounters:  04/08/24 195 lb (88.5 kg)  03/06/24 185 lb (83.9 kg)  03/02/24 183 lb 3.2 oz (83.1 kg)    VITALS: BP 110/68   Pulse 70   Ht 5' 6 (1.676 m)   Wt 195 lb (88.5 kg)   SpO2 96%   BMI 31.47 kg/m   EXAM: General appearance: alert and no distress Lungs: clear to auscultation bilaterally Heart: regular rate and rhythm, S1, S2 normal, and systolic murmur: systolic ejection 3/6, crescendo mid peaking of the second right intercostal space Extremities: extremities normal,  atraumatic, no cyanosis or edema Neurologic: Grossly normal  EKG: EKG Interpretation Date/Time:  Friday April 08 2024 08:06:12 EDT Ventricular Rate:  70 PR Interval:    QRS Duration:  86 QT Interval:  428 QTC Calculation: 462 R Axis:   27  Text Interpretation: Atrial fibrillation with premature ventricular or aberrantly conducted complexes When compared with ECG of 01-Mar-2024 20:46,  afib has replaced sinus rhythm Confirmed by Mona Kent [phone]) on 04/08/2024 8:12:44 AM - personally reviewed  ASSESSMENT: Coronary artery disease status post CABG in 2017 with LIMA to LAD and SVG to RPDA and diagonal that are patent and occluded SVG to OM1 and OM 2 Dyslipidemia, goal LDL less than 70 Myasthenia gravis Dementia Moderate aortic stenosis CKD stage IV Chronic heart failure with preserved ejection fraction  PLAN: 1.   Mr. Wallen is fairly stable.  He had another recent echo which shows on moderate aortic stenosis by velocities although is read as no aortic stenosis.  On exam he clearly has some mild to moderate aortic stenosis.  Will continue to monitor this.  At times if he has more swelling he could certainly use additional Lasix  on a sliding scale for that.  Blood pressure is well-controlled today.  He is not on a statin due to myasthenia gravis, however his lipids have been generally well-controlled with his last LDL of 66.  Plan follow-up in 6 months or sooner as necessary.  Kent KYM Mona, MD, [facility], FNLA, FACP  [place]  [facility]  Medical Director of the [facility] Lip[facility]  Cardiovascular Risk Reduction Clinic Diplomate of the American Board of Clinical Lipidology Attending Cardiologist  Direct Dial : [phone]  Fax: [phone]  Website:  [email or web address]   Kent BROCKS Rochell Puett 04/08/2024, 8:14 AM

## 2024-04-16 ENCOUNTER — Encounter: Payer: Self-pay | Admitting: Internal Medicine

## 2024-06-03 ENCOUNTER — Emergency Department (HOSPITAL_COMMUNITY)

## 2024-06-03 ENCOUNTER — Other Ambulatory Visit: Payer: Self-pay

## 2024-06-03 ENCOUNTER — Emergency Department (HOSPITAL_COMMUNITY)
Admission: EM | Admit: 2024-06-03 | Discharge: 2024-06-04 | Disposition: A | Discharge status: Home / Self Care | Attending: Emergency Medicine | Admitting: Emergency Medicine

## 2024-06-03 DIAGNOSIS — Z794 Long term (current) use of insulin: Secondary | ICD-10-CM | POA: Insufficient documentation

## 2024-06-03 DIAGNOSIS — D649 Anemia, unspecified: Secondary | ICD-10-CM | POA: Diagnosis not present

## 2024-06-03 DIAGNOSIS — I509 Heart failure, unspecified: Secondary | ICD-10-CM | POA: Insufficient documentation

## 2024-06-03 DIAGNOSIS — R0602 Shortness of breath: Secondary | ICD-10-CM | POA: Insufficient documentation

## 2024-06-03 DIAGNOSIS — I480 Paroxysmal atrial fibrillation: Secondary | ICD-10-CM | POA: Insufficient documentation

## 2024-06-03 DIAGNOSIS — Z7901 Long term (current) use of anticoagulants: Secondary | ICD-10-CM | POA: Insufficient documentation

## 2024-06-03 DIAGNOSIS — R739 Hyperglycemia, unspecified: Secondary | ICD-10-CM | POA: Insufficient documentation

## 2024-06-03 DIAGNOSIS — R531 Weakness: Secondary | ICD-10-CM | POA: Insufficient documentation

## 2024-06-03 LAB — CBC
HCT: 36.4 % — ABNORMAL LOW (ref 39.0–52.0)
Hemoglobin: 11.4 g/dL — ABNORMAL LOW (ref 13.0–17.0)
MCH: 27.1 pg (ref 26.0–34.0)
MCHC: 31.3 g/dL (ref 30.0–36.0)
MCV: 86.7 fL (ref 80.0–100.0)
Platelets: 195 K/uL (ref 150–400)
RBC: 4.2 MIL/uL — ABNORMAL LOW (ref 4.22–5.81)
RDW: 15.4 % (ref 11.5–15.5)
WBC: 9 K/uL (ref 4.0–10.5)
nRBC: 0 % (ref 0.0–0.2)

## 2024-06-03 LAB — BASIC METABOLIC PANEL WITH GFR
Anion gap: 12 (ref 5–15)
BUN: 30 mg/dL — ABNORMAL HIGH (ref 8–23)
CO2: 19 mmol/L — ABNORMAL LOW (ref 22–32)
Calcium: 7.6 mg/dL — ABNORMAL LOW (ref 8.9–10.3)
Chloride: 106 mmol/L (ref 98–111)
Creatinine, Ser: 2.17 mg/dL — ABNORMAL HIGH (ref 0.61–1.24)
GFR, Estimated: 30 mL/min — ABNORMAL LOW (ref >60)
Glucose, Bld: 362 mg/dL — ABNORMAL HIGH (ref 70–99)
Potassium: 3.8 mmol/L (ref 3.5–5.1)
Sodium: 137 mmol/L (ref 135–145)

## 2024-06-03 LAB — HEPATIC FUNCTION PANEL
ALT: 14 U/L (ref 0–44)
AST: 24 U/L (ref 15–41)
Albumin: 3 g/dL — ABNORMAL LOW (ref 3.5–5.0)
Alkaline Phosphatase: 117 U/L (ref 38–126)
Bilirubin, Direct: 0.1 mg/dL (ref 0.0–0.2)
Indirect Bilirubin: 0.2 mg/dL — ABNORMAL LOW (ref 0.3–0.9)
Total Bilirubin: 0.3 mg/dL (ref 0.0–1.2)
Total Protein: 6.1 g/dL — ABNORMAL LOW (ref 6.5–8.1)

## 2024-06-03 LAB — TROPONIN I (HIGH SENSITIVITY)
Troponin I (High Sensitivity): 12 ng/L (ref <18)
Troponin I (High Sensitivity): 14 ng/L (ref <18)

## 2024-06-03 LAB — BRAIN NATRIURETIC PEPTIDE: B Natriuretic Peptide: 142 pg/mL — ABNORMAL HIGH (ref 0.0–100.0)

## 2024-06-03 LAB — CBG MONITORING, ED: Glucose-Capillary: 305 mg/dL — ABNORMAL HIGH (ref 70–99)

## 2024-06-03 MED ORDER — CALCIUM CARBONATE ANTACID 500 MG PO CHEW: 1.0000 | CHEWABLE_TABLET | Freq: Two times a day (BID) | ORAL | 0 refills | Status: DC

## 2024-06-03 NOTE — Discharge Instructions (Addendum)
 Follow-up with your doctor in a few days.  Take calcium  for the next few days to hopefully get the calcium  back up to normal.

## 2024-06-03 NOTE — ED Provider Notes (Addendum)
 Fernando Hess Provider Note   CSN: [id number] Arrival date & time: 06/03/24  1653     Patient presents with: Shortness of Breath   Fernando Hess is a 78 y.o. male.    Shortness of Breath Patient shortness of breath dizziness.  Reportedly has had for last couple days.  History of CHF.  Reportedly had sats in the 80s at PCPs office.  Reportedly started on nonrebreather.  On nasal cannula upon arrival here.  States he feels weak.     Prior to Admission medications   Medication Sig Start Date End Date Taking? Authorizing Provider  apixaban  (ELIQUIS ) 5 MG TABS tablet Take 1 tablet (5 mg total) by mouth 2 (two) times daily. 04/08/24   Hilty, Vinie BROCKS, MD  Continuous Glucose Receiver (FREESTYLE LIBRE 3 READER) DEVI USE TO TEST BLOOD SUGAR 03/07/24   [provider]  Continuous Glucose Sensor (FREESTYLE LIBRE 3 PLUS SENSOR) MISC CHANGE EVERY 15 DAYS AS RECOMMENDED BY THE MANUFACTURER 90 DAYS 03/02/24   [provider]  cyanocobalamin  (VITAMIN B12) 1000 MCG/ML injection Inject 1,000 mcg into the muscle once a week. Inject on Saturday    [provider]  donepezil  (ARICEPT ) 10 MG tablet Take 5 mg by mouth at bedtime. Take 0.5 tablet (5mg ) by mouth daily for 30 days.    [provider]  furosemide  (LASIX ) 40 MG tablet Take 1 tablet (40 mg total) by mouth 3 (three) times a week. Take one tablet (40mg ) by mouth three times weekly on Monday, Wednesday, and Friday. 03/04/24   Patsy Lenis, MD  hydrALAZINE  (APRESOLINE ) 25 MG tablet Take 1 tablet (25 mg total) by mouth daily as needed (Hypertension). Take one tablet (25mg ) by mouth daily as needed if SBP is >160 03/04/24   Patsy Lenis, MD  insulin  lispro (HUMALOG ) 100 UNIT/ML KwikPen Inject 8 Units into the skin 3 (three) times daily before meals. 05/15/23   [provider]  lamoTRIgine  (LAMICTAL ) 25 MG tablet Take 25 mg by mouth daily. 02/18/24   [provider]  LANTUS  SOLOSTAR 100 UNIT/ML Solostar Pen Inject 36 Units into the skin daily before breakfast. 02/19/23   [provider]  latanoprost (XALATAN) 0.005 % ophthalmic solution Place 1 drop into both eyes at bedtime.    [provider]  MYRBETRIQ  25 MG TB24 tablet Take 25 mg by mouth at bedtime as needed (bladder control).    [provider]  ondansetron  (ZOFRAN -ODT) 4 MG disintegrating tablet Take 1 tablet (4 mg total) by mouth every 8 (eight) hours as needed. 03/06/24   Melvenia Motto, MD  pantoprazole  (PROTONIX ) 40 MG tablet TAKE 1 TABLET BY MOUTH EVERY DAY 11/25/19   Abran Norleen SAILOR, MD  polyethylene glycol powder (GLYCOLAX /MIRALAX ) 17 GM/SCOOP powder Take 17 g by mouth as needed for mild constipation or moderate constipation. 03/04/24   Patsy Lenis, MD  QUEtiapine  (SEROQUEL ) 50 MG tablet Take 50 mg by mouth at bedtime. 02/23/23   [provider]  tamsulosin  (FLOMAX ) 0.4 MG CAPS capsule Take 0.4 mg by mouth daily. 01/10/24   [provider]  tirzepatide CLOYDE) 2.5 MG/0.5ML Pen Inject 2.5 mg into the skin once a week. Inject on Tuesday    [provider]  TRADJENTA  5 MG TABS tablet Take 5 mg by mouth daily.    [provider]    Allergies: Cilostazol , Magnesium -containing compounds, Donepezil , Dulaglutide, Levofloxacin, Liraglutide, and Lisinopril    Review of Systems  Respiratory:  Positive  for shortness of breath.     Updated Vital Signs BP (!) 167/84   Pulse (!) 127   Temp 98.8 F (37.1 C) (Oral)   Resp 18   SpO2 91%   Physical Exam Vitals and nursing note reviewed.  HENT:     Head: Normocephalic.  Cardiovascular:     Rate and Rhythm: Normal rate.  Pulmonary:     Breath sounds: No wheezing or rhonchi.  Chest:     Chest wall: No tenderness.  Abdominal:     Tenderness: There is no abdominal tenderness.  Musculoskeletal:     Right lower leg: Edema present.     Left lower leg: Edema present.  Skin:     Capillary Refill: Capillary refill takes less than 2 seconds.  Neurological:     Mental Status: He is alert.     (all labs ordered are listed, but only abnormal results are displayed) Labs Reviewed  BASIC METABOLIC PANEL WITH GFR - Abnormal; Notable for the following components:      Result Value   CO2 19 (*)    Glucose, Bld 362 (*)    BUN 30 (*)    Creatinine, Ser 2.17 (*)    Calcium  7.6 (*)    GFR, Estimated 30 (*)    All other components within normal limits  CBC - Abnormal; Notable for the following components:   RBC 4.20 (*)    Hemoglobin 11.4 (*)    HCT 36.4 (*)    All other components within normal limits  BRAIN NATRIURETIC PEPTIDE - Abnormal; Notable for the following components:   B Natriuretic Peptide 142.0 (*)    All other components within normal limits  RESP PANEL BY RT-PCR (RSV, FLU A&B, COVID)  RVPGX2  HEPATIC FUNCTION PANEL  CBG MONITORING, ED  TROPONIN I (HIGH SENSITIVITY)  TROPONIN I (HIGH SENSITIVITY)    EKG: EKG Interpretation Date/Time:  Friday June 03 2024 16:59:49 EDT Ventricular Rate:  75 PR Interval:  181 QRS Duration:  134 QT Interval:  429 QTC Calculation: 480 R Axis:   31  Text Interpretation: Sinus rhythm Atrial premature complex Right bundle branch block Confirmed by Patsey Lot [phone]) on 06/03/2024 6:31:32 PM  Radiology: [facility] Chest 2 View Result Date: 06/03/2024 CLINICAL DATA:  Short of breath worsening for several days, history of atrial fibrillation and CHF EXAM: CHEST - 2 VIEW COMPARISON:  03/06/2024 FINDINGS: Frontal and lateral views of the chest demonstrate postsurgical changes from CABG. Cardiac silhouette is stable. No acute airspace disease, effusion, or pneumothorax. No acute bony abnormalities. IMPRESSION: 1. Stable chest, no acute process. Electronically Signed   By: Ozell Daring M.D.   On: 06/03/2024 18:44     Procedures   Medications Ordered in the ED - No data to display                                   Medical Decision Making Amount and/or Complexity of Data Reviewed Labs: ordered. Radiology: ordered.  Risk Decision regarding hospitalization.   Patient presents with shortness of breath.  Reported hypoxic for PCP.  Differential diagnosis includes causes such as CHF URI viral syndrome.  Reportedly had sats in the 80s but better here.  Has chronic renal issues.  Reportedly on Lasix  only 3 times a week.  History of paroxysmal A-fib.  Patient's BNP is mildly elevated.  CBC shows a mild anemia.  Basic metabolic shows elevated CBG with  no anion gap.  Creatinine actually somewhat better than it has been.  However calcium  is low at 7.6.  Patient has some swelling on his legs.  Will not walk.  With weakness I think patient will benefit from admission to the hospital for further workup.  Of note the patient refused COVID testing.   Now off nasal cannula oxygen and her sats are improved.  Patient was informed that he would be admitted to the hospital if he cannot walk.  Then began to feel better and was able to ambulate.  Was not hypoxic with ambulating.  Patient is eager to go home.  Will discharge.      Final diagnoses:  Weakness  Hypocalcemia    ED Discharge Orders     None          Patsey Lot, MD 06/03/24 2135    Patsey Lot, MD 06/03/24 2145

## 2024-06-03 NOTE — ED Triage Notes (Signed)
 Pt bib [facility]. Pt was at the doctor's office for shob and they recommended he come to ER. SHOB increasing getting worse over past few days. Hx of CHF and Afib. Pt wears cpap at night. Pt initially SPO2 in 80s and placed on NRB. Arrives on 2L nasal cannula. Has baseline confusion

## 2024-06-03 NOTE — ED Notes (Signed)
 A [place] Sandwich and diet cola given to pt

## 2024-06-03 NOTE — ED Notes (Signed)
 PT refuses to be ambulated at this time. Physician notified.

## 2024-06-03 NOTE — ED Notes (Signed)
 Pt refused covid swab.

## 2024-06-03 NOTE — ED Notes (Signed)
 Patient transported to X-ray

## 2024-06-03 NOTE — ED Notes (Signed)
 Report given to receiving RN.

## 2024-06-03 NOTE — ED Notes (Signed)
 Cy Che (Spouse) of Agron Swiney called asking for a update. Her number is [phone].

## 2024-06-04 NOTE — ED Notes (Signed)
 Pt had an accident of urinating on himself and floor. Pt cleaned and changed into blue scrub pant. Bed also changed with dry linens

## 2024-06-04 NOTE — ED Notes (Signed)
 [facility] here to have pt transported back home

## 2024-06-16 ENCOUNTER — Ambulatory Visit: Payer: Medicare Other | Admitting: Internal Medicine

## 2024-06-20 NOTE — Progress Notes (Signed)
 HPI M never smoker followed for OSA, complicated by CAD/ MI/ RBBB/ CABG, PAD, HTN, dCHF, OSA, DM2/ retinopathy, CKD3, Myasthenia Gravis,  ENT/ [facility]- Septoplasty/ turbinate reduction 08/30/21. NPSG ([facility]) 11/25/18- AHI 37.9/ hr, desaturation to 79%, body weight 201 lbs Walk Test Room Air 06/18/21- 3 Steady laps- lowest O2 sat 95%, max HR 103. PFT 04/21/22- Moderate Obstruction, weak effort. Per Tech-Patient refused DLCP/ Pleth today CPET 11/20/21- normal with exercise limitedd by deconditioning and body habitus WALK TEST ON ROOM AIR 06/21/24- 3 slow laps with one brief rest. Lowest saturation 94%- not qualified for portable O2 ================================================================    12/15/23-  77yoM never smoker followed for OSA/quit CPAP, complicated by CAD/ MI/ RBBB/ CABG/ AFib, PAD, HTN, dCHF,  DM2/ retinopathy, CKD3, Myasthenia Gravis, Dementia, -[name]  5, Melatonin, Cymbalta , Albuterol  hfa, Pulmicort  neb solution,  CPAP- auto 5-15 / [employer]        AirSense 10 AutoSet                           Wife Cy here [employer] -Not Using Body weight today-  188 lbs We ordered ONOX last visit- not done?        He is due for f/u CT chest for LUL ground glass Discussed the use of AI scribe software for clinical note transcription with the patient, who gave verbal consent to proceed.  History of Present Illness   The patient, with a history of myasthenia gravis and a recent pneumonia, presents with a persistent cough that is bothersome at night but present all day. The cough is dry and non-productive, and it is not associated with heartburn or reflux. The cough is not significantly worse during the day or night, but it does interfere with sleep. The patient also reports exertional dyspnea, which is unchanged. The dyspnea is worse with exertion, such as walking, and the patient uses a rolling walker for assistance. The patient walks about a fourth of a mile a day, taking frequent rests. The patient has  not needed to use a nebulizer since January, and the myasthenia gravis is reportedly improving. The patient has not fallen in several months, a significant improvement from the previous frequency of several falls a week. The patient had a chest x-ray in January that showed improvement from a previous pneumonia.     CXR 09/30/23- IMPRESSION: 1. Interval improvement. Previously noted opacities in the left upper lobe have mostly resolved. 2. No new abnormalities.  No acute findings.  Assessment and Plan.    Chronic cough He reports a persistent dry cough occurring both day and night, impacting his sleep. There is no associated heartburn or reflux, and the cough is non-productive. Shortness of breath occurs with exertion, consistent with known respiratory issues. He requested cough medicine for symptom management. - Prescribe Prometh  DM for cough management. - Advise the use of over-the-counter throat lozenges for symptomatic relief.  Pneumonia He had pneumonia in the left upper lobe during the winter, which showed improvement on a chest x-ray in January. There are no new respiratory symptoms suggestive of pneumonia recurrence.  Myasthenia gravis His myasthenia gravis is reportedly improving according to his neurologist. He is scheduled for another test in about a month to assess the progress of the condition.  Dry mouth He experiences dry mouth, possibly exacerbated by his sleep medication, quetiapine . He uses throat lozenges frequently to manage this symptom. Xylimelts, an over-the-counter product for dry mouth, was recommended as an alternative to avoid further medication  that might dry the mouth. - Recommend trying Xylimelts for dry mouth management.  Follow-up He had an overnight oximetry sleep study, but the results are not yet available. The home care company needs to be contacted to obtain the results. - Contact the home care company to obtain the results of the overnight oximetry sleep  study.      06/21/24- 78yoM never smoker followed for OSA/quit CPAP, complicated by CAD/ MI/ RBBB/ CABG/ AFib, PAD, HTN, dCHF,  DM2/ retinopathy, CKD3, Myasthenia Gravis, Dementia, -[name]  5, Melatonin, Cymbalta , Albuterol  hfa, Pulmicort  neb solution,  CPAP- auto 5-15 / [employer]        AirSense 10 AutoSet                           Wife Cy here [employer] -Not Using Body weight today-   At ED in Sept for weakness with hypocalcemia. WALK TEST ON ROOM AIR 06/21/24- 3 slow laps with one brief rest. Lowest saturation 94%- not qualified for portable O2 Discussed the use of AI scribe software for clinical note transcription with the patient, who gave verbal consent to proceed.  History of Present Illness   Fernando Hess is an 78 year old male who presents for a flu shot and to discuss issues with his oxygen therapy.  He uses an oxygen concentrator called [employer], which has experienced clogging issues. A repairman replaced a part, restoring oxygen flow, but there were instances when he used the oxygen at night without receiving it. During one such incident, his oxygen level was recorded at 80% by the fire department.  He primarily uses oxygen during sleep and lacks a portable oxygen concentrator for outings. His oxygen level was low during a recent hospital visit on June 03, 2024, and was also low when checked by the fire department while sitting still. He has not received new cannulas since June and continues to use the same one. Walk test on room air here today- lowest O2 sat was 94% after 3 laps. He is given flu vax.     CXR 06/03/24 IMPRESSION: 1. Stable chest, no acute process.  Assessment and Plan:    Chronic hypoxemia requiring supplemental oxygen Chronic hypoxemia primarily at night. Oxygen saturation was 80% previously, now 96% on room air. - Assess oxygen saturation during ambulation for portable concentrator need.- Did not qualify today. - Document oxygen level drop during ambulation  and response to portable concentrator.  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 Delivery issues resolved. - Continue nocturnal oxygen therapy. - Encourage daytime oxygen use if breathing difficulties occur.     Flu vax today  ROS-see HPI   + = positive Constitutional:    weight loss, night sweats, fevers, chills, fatigue, lassitude. HEENT:    headaches, difficulty swallowing, tooth/dental problems, sore throat,       sneezing, itching, ear ache, nasal congestion, post nasal drip, snoring CV:    chest pain, orthopnea, PND, swelling in lower extremities, anasarca,                                dizziness, palpitations Resp:   +shortness of breath with exertion or at rest.                productive cough,   non-productive cough, coughing up of blood.  change in color of mucus.  wheezing.   Skin:    rash or lesions. GI:  No-   heartburn, indigestion, abdominal pain, nausea, vomiting, diarrhea,                 change in bowel habits, loss of appetite GU: dysuria, change in color of urine, no urgency or frequency.   flank pain. MS:   joint pain, stiffness, decreased range of motion, back pain. Neuro-     nothing unusual Psych:  change in mood or affect.  depression or anxiety.   memory loss.   OBJ- Physical Exam     Unlabored General- Alert, Oriented, Affect-appropriate, Distress- none acute, +obese Skin- rash-none, lesions- none, excoriation- none Lymphadenopathy- none Head- atraumatic            Eyes- Gross vision intact, PERRLA, conjunctivae and secretions clear            Ears- Hearing, canals-normal            Nose- Clear, no-Septal dev, mucus, polyps, erosion, perforation             Throat- Mallampati III , mucosa clear , drainage- none, tonsils- atrophic,  +teeth/ some repair,  Neck- flexible , trachea midline, no stridor , thyroid  nl, carotid no bruit Chest - symmetrical excursion , unlabored           Heart/CV- RR/hx  AFIb , no murmur , no gallop  , no rub, nl s1 s2                           - JVD- none , edema- none, stasis changes- none, varices- none           Lung- clear to P&A, wheeze- none, cough- none , dullness-none, rub- none           Chest wall-  Abd-  Br/ Gen/ Rectal- Not done, not indicated Extrem- cyanosis- none, clubbing, none, atrophy- none, strength- nl, +rolling walker Neuro- grossly intact to observation

## 2024-06-21 ENCOUNTER — Ambulatory Visit: Admitting: Internal Medicine

## 2024-06-21 ENCOUNTER — Encounter: Payer: Self-pay | Admitting: Internal Medicine

## 2024-06-21 VITALS — BP 145/84 | HR 72 | Temp 98.6°F | Ht 66.0 in | Wt 193.6 lb

## 2024-06-21 DIAGNOSIS — Z23 Encounter for immunization: Secondary | ICD-10-CM

## 2024-06-21 DIAGNOSIS — R0902 Hypoxemia: Secondary | ICD-10-CM

## 2024-06-21 DIAGNOSIS — G479 Sleep disorder, unspecified: Secondary | ICD-10-CM | POA: Diagnosis not present

## 2024-06-21 NOTE — Patient Instructions (Addendum)
 Order- Flu vax senior  We can continue home O2 and current inhalers.  Order- O2 qualifying walk test> POC qualifying

## 2024-06-25 ENCOUNTER — Encounter: Payer: Self-pay | Admitting: Internal Medicine

## 2024-06-28 ENCOUNTER — Encounter (HOSPITAL_COMMUNITY): Payer: Self-pay | Admitting: Internal Medicine

## 2024-06-28 ENCOUNTER — Other Ambulatory Visit: Payer: Self-pay

## 2024-06-28 ENCOUNTER — Emergency Department (HOSPITAL_COMMUNITY)

## 2024-06-28 ENCOUNTER — Inpatient Hospital Stay (HOSPITAL_COMMUNITY)
Admission: EM | Admit: 2024-06-28 | Discharge: 2024-07-01 | DRG: 065 | Disposition: A | Discharge status: Home Health | Attending: Student | Admitting: Student

## 2024-06-28 DIAGNOSIS — Z96641 Presence of right artificial hip joint: Secondary | ICD-10-CM | POA: Diagnosis present

## 2024-06-28 DIAGNOSIS — Z6830 Body mass index (BMI) 30.0-30.9, adult: Secondary | ICD-10-CM | POA: Diagnosis not present

## 2024-06-28 DIAGNOSIS — I6389 Other cerebral infarction: Secondary | ICD-10-CM | POA: Diagnosis not present

## 2024-06-28 DIAGNOSIS — E1165 Type 2 diabetes mellitus with hyperglycemia: Secondary | ICD-10-CM | POA: Diagnosis present

## 2024-06-28 DIAGNOSIS — G603 Idiopathic progressive neuropathy: Secondary | ICD-10-CM | POA: Diagnosis present

## 2024-06-28 DIAGNOSIS — G709 Myoneural disorder, unspecified: Secondary | ICD-10-CM

## 2024-06-28 DIAGNOSIS — Z794 Long term (current) use of insulin: Secondary | ICD-10-CM | POA: Diagnosis not present

## 2024-06-28 DIAGNOSIS — G4733 Obstructive sleep apnea (adult) (pediatric): Secondary | ICD-10-CM | POA: Diagnosis present

## 2024-06-28 DIAGNOSIS — I6329 Cerebral infarction due to unspecified occlusion or stenosis of other precerebral arteries: Secondary | ICD-10-CM | POA: Diagnosis present

## 2024-06-28 DIAGNOSIS — I639 Cerebral infarction, unspecified: Secondary | ICD-10-CM | POA: Insufficient documentation

## 2024-06-28 DIAGNOSIS — F0390 Unspecified dementia without behavioral disturbance: Secondary | ICD-10-CM | POA: Diagnosis present

## 2024-06-28 DIAGNOSIS — E1122 Type 2 diabetes mellitus with diabetic chronic kidney disease: Secondary | ICD-10-CM | POA: Diagnosis present

## 2024-06-28 DIAGNOSIS — Z888 Allergy status to other drugs, medicaments and biological substances status: Secondary | ICD-10-CM

## 2024-06-28 DIAGNOSIS — I4510 Unspecified right bundle-branch block: Secondary | ICD-10-CM | POA: Diagnosis present

## 2024-06-28 DIAGNOSIS — E785 Hyperlipidemia, unspecified: Secondary | ICD-10-CM | POA: Diagnosis not present

## 2024-06-28 DIAGNOSIS — N39498 Other specified urinary incontinence: Secondary | ICD-10-CM | POA: Diagnosis present

## 2024-06-28 DIAGNOSIS — R0902 Hypoxemia: Secondary | ICD-10-CM | POA: Diagnosis present

## 2024-06-28 DIAGNOSIS — Z8679 Personal history of other diseases of the circulatory system: Secondary | ICD-10-CM | POA: Diagnosis not present

## 2024-06-28 DIAGNOSIS — I130 Hypertensive heart and chronic kidney disease with heart failure and stage 1 through stage 4 chronic kidney disease, or unspecified chronic kidney disease: Secondary | ICD-10-CM | POA: Diagnosis present

## 2024-06-28 DIAGNOSIS — J9611 Chronic respiratory failure with hypoxia: Secondary | ICD-10-CM | POA: Diagnosis not present

## 2024-06-28 DIAGNOSIS — N184 Chronic kidney disease, stage 4 (severe): Secondary | ICD-10-CM | POA: Diagnosis present

## 2024-06-28 DIAGNOSIS — Z8249 Family history of ischemic heart disease and other diseases of the circulatory system: Secondary | ICD-10-CM

## 2024-06-28 DIAGNOSIS — I25810 Atherosclerosis of coronary artery bypass graft(s) without angina pectoris: Secondary | ICD-10-CM | POA: Diagnosis present

## 2024-06-28 DIAGNOSIS — I5032 Chronic diastolic (congestive) heart failure: Secondary | ICD-10-CM | POA: Diagnosis present

## 2024-06-28 DIAGNOSIS — Z1152 Encounter for screening for COVID-19: Secondary | ICD-10-CM

## 2024-06-28 DIAGNOSIS — I160 Hypertensive urgency: Secondary | ICD-10-CM | POA: Diagnosis present

## 2024-06-28 DIAGNOSIS — E119 Type 2 diabetes mellitus without complications: Secondary | ICD-10-CM

## 2024-06-28 DIAGNOSIS — I4891 Unspecified atrial fibrillation: Secondary | ICD-10-CM | POA: Diagnosis not present

## 2024-06-28 DIAGNOSIS — R29703 NIHSS score 3: Secondary | ICD-10-CM | POA: Diagnosis not present

## 2024-06-28 DIAGNOSIS — Z9841 Cataract extraction status, right eye: Secondary | ICD-10-CM

## 2024-06-28 DIAGNOSIS — R29701 NIHSS score 1: Secondary | ICD-10-CM | POA: Diagnosis present

## 2024-06-28 DIAGNOSIS — Z8659 Personal history of other mental and behavioral disorders: Secondary | ICD-10-CM | POA: Diagnosis not present

## 2024-06-28 DIAGNOSIS — R0602 Shortness of breath: Secondary | ICD-10-CM | POA: Diagnosis present

## 2024-06-28 DIAGNOSIS — F419 Anxiety disorder, unspecified: Secondary | ICD-10-CM | POA: Diagnosis present

## 2024-06-28 DIAGNOSIS — I779 Disorder of arteries and arterioles, unspecified: Secondary | ICD-10-CM | POA: Diagnosis not present

## 2024-06-28 DIAGNOSIS — Z9842 Cataract extraction status, left eye: Secondary | ICD-10-CM

## 2024-06-28 DIAGNOSIS — Z79899 Other long term (current) drug therapy: Secondary | ICD-10-CM

## 2024-06-28 DIAGNOSIS — N401 Enlarged prostate with lower urinary tract symptoms: Secondary | ICD-10-CM | POA: Diagnosis present

## 2024-06-28 DIAGNOSIS — Z87442 Personal history of urinary calculi: Secondary | ICD-10-CM

## 2024-06-28 DIAGNOSIS — G7000 Myasthenia gravis without (acute) exacerbation: Secondary | ICD-10-CM | POA: Diagnosis present

## 2024-06-28 DIAGNOSIS — I480 Paroxysmal atrial fibrillation: Secondary | ICD-10-CM | POA: Diagnosis present

## 2024-06-28 DIAGNOSIS — G253 Myoclonus: Secondary | ICD-10-CM | POA: Diagnosis present

## 2024-06-28 DIAGNOSIS — F32A Depression, unspecified: Secondary | ICD-10-CM | POA: Diagnosis present

## 2024-06-28 DIAGNOSIS — R531 Weakness: Principal | ICD-10-CM

## 2024-06-28 DIAGNOSIS — I252 Old myocardial infarction: Secondary | ICD-10-CM

## 2024-06-28 DIAGNOSIS — R471 Dysarthria and anarthria: Secondary | ICD-10-CM | POA: Diagnosis present

## 2024-06-28 DIAGNOSIS — Z7901 Long term (current) use of anticoagulants: Secondary | ICD-10-CM

## 2024-06-28 DIAGNOSIS — E66811 Obesity, class 1: Secondary | ICD-10-CM | POA: Diagnosis present

## 2024-06-28 DIAGNOSIS — Z833 Family history of diabetes mellitus: Secondary | ICD-10-CM

## 2024-06-28 DIAGNOSIS — N179 Acute kidney failure, unspecified: Secondary | ICD-10-CM | POA: Diagnosis present

## 2024-06-28 DIAGNOSIS — Z8673 Personal history of transient ischemic attack (TIA), and cerebral infarction without residual deficits: Secondary | ICD-10-CM

## 2024-06-28 DIAGNOSIS — Z961 Presence of intraocular lens: Secondary | ICD-10-CM | POA: Diagnosis present

## 2024-06-28 DIAGNOSIS — E538 Deficiency of other specified B group vitamins: Secondary | ICD-10-CM | POA: Diagnosis present

## 2024-06-28 DIAGNOSIS — E1151 Type 2 diabetes mellitus with diabetic peripheral angiopathy without gangrene: Secondary | ICD-10-CM | POA: Diagnosis not present

## 2024-06-28 DIAGNOSIS — I6529 Occlusion and stenosis of unspecified carotid artery: Secondary | ICD-10-CM | POA: Diagnosis not present

## 2024-06-28 DIAGNOSIS — Z7984 Long term (current) use of oral hypoglycemic drugs: Secondary | ICD-10-CM

## 2024-06-28 DIAGNOSIS — Z7985 Long-term (current) use of injectable non-insulin antidiabetic drugs: Secondary | ICD-10-CM

## 2024-06-28 DIAGNOSIS — I2510 Atherosclerotic heart disease of native coronary artery without angina pectoris: Secondary | ICD-10-CM | POA: Diagnosis present

## 2024-06-28 DIAGNOSIS — Z881 Allergy status to other antibiotic agents status: Secondary | ICD-10-CM

## 2024-06-28 LAB — CBC WITH DIFFERENTIAL/PLATELET
Abs Immature Granulocytes: 0.06 K/uL (ref 0.00–0.07)
Basophils Absolute: 0.1 K/uL (ref 0.0–0.1)
Basophils Relative: 1 %
Eosinophils Absolute: 0.2 K/uL (ref 0.0–0.5)
Eosinophils Relative: 2 %
HCT: 41.8 % (ref 39.0–52.0)
Hemoglobin: 13 g/dL (ref 13.0–17.0)
Immature Granulocytes: 1 %
Lymphocytes Relative: 17 %
Lymphs Abs: 1.6 K/uL (ref 0.7–4.0)
MCH: 26.2 pg (ref 26.0–34.0)
MCHC: 31.1 g/dL (ref 30.0–36.0)
MCV: 84.3 fL (ref 80.0–100.0)
Monocytes Absolute: 1 K/uL (ref 0.1–1.0)
Monocytes Relative: 11 %
Neutro Abs: 6.3 K/uL (ref 1.7–7.7)
Neutrophils Relative %: 68 %
Platelets: 240 K/uL (ref 150–400)
RBC: 4.96 MIL/uL (ref 4.22–5.81)
RDW: 15 % (ref 11.5–15.5)
WBC: 9.2 K/uL (ref 4.0–10.5)
nRBC: 0 % (ref 0.0–0.2)

## 2024-06-28 LAB — HEPATIC FUNCTION PANEL
ALT: 11 U/L (ref 0–44)
AST: 17 U/L (ref 15–41)
Albumin: 3 g/dL — ABNORMAL LOW (ref 3.5–5.0)
Alkaline Phosphatase: 107 U/L (ref 38–126)
Bilirubin, Direct: <0.1 mg/dL (ref 0.0–0.2)
Total Bilirubin: 0.8 mg/dL (ref 0.0–1.2)
Total Protein: 6.3 g/dL — ABNORMAL LOW (ref 6.5–8.1)

## 2024-06-28 LAB — I-STAT CHEM 8, ED
BUN: 31 mg/dL — ABNORMAL HIGH (ref 8–23)
Calcium, Ion: 1.17 mmol/L (ref 1.15–1.40)
Chloride: 105 mmol/L (ref 98–111)
Creatinine, Ser: 2.2 mg/dL — ABNORMAL HIGH (ref 0.61–1.24)
Glucose, Bld: 315 mg/dL — ABNORMAL HIGH (ref 70–99)
HCT: 41 % (ref 39.0–52.0)
Hemoglobin: 13.9 g/dL (ref 13.0–17.0)
Potassium: 4.3 mmol/L (ref 3.5–5.1)
Sodium: 139 mmol/L (ref 135–145)
TCO2: 25 mmol/L (ref 22–32)

## 2024-06-28 LAB — I-STAT VENOUS BLOOD GAS, ED
Acid-Base Excess: 0 mmol/L (ref 0.0–2.0)
Bicarbonate: 26.3 mmol/L (ref 20.0–28.0)
Calcium, Ion: 1.17 mmol/L (ref 1.15–1.40)
HCT: 40 % (ref 39.0–52.0)
Hemoglobin: 13.6 g/dL (ref 13.0–17.0)
O2 Saturation: 96 %
Potassium: 4.2 mmol/L (ref 3.5–5.1)
Sodium: 139 mmol/L (ref 135–145)
TCO2: 28 mmol/L (ref 22–32)
pCO2, Ven: 48.2 mmHg (ref 44–60)
pH, Ven: 7.345 (ref 7.25–7.43)
pO2, Ven: 84 mmHg — ABNORMAL HIGH (ref 32–45)

## 2024-06-28 LAB — BASIC METABOLIC PANEL WITH GFR
Anion gap: 10 (ref 5–15)
BUN: 29 mg/dL — ABNORMAL HIGH (ref 8–23)
CO2: 23 mmol/L (ref 22–32)
Calcium: 8.5 mg/dL — ABNORMAL LOW (ref 8.9–10.3)
Chloride: 104 mmol/L (ref 98–111)
Creatinine, Ser: 2.24 mg/dL — ABNORMAL HIGH (ref 0.61–1.24)
GFR, Estimated: 29 mL/min — ABNORMAL LOW (ref >60)
Glucose, Bld: 312 mg/dL — ABNORMAL HIGH (ref 70–99)
Potassium: 4.2 mmol/L (ref 3.5–5.1)
Sodium: 137 mmol/L (ref 135–145)

## 2024-06-28 LAB — BRAIN NATRIURETIC PEPTIDE: B Natriuretic Peptide: 91.7 pg/mL (ref 0.0–100.0)

## 2024-06-28 LAB — TROPONIN I (HIGH SENSITIVITY)
Troponin I (High Sensitivity): 9 ng/L (ref <18)
Troponin I (High Sensitivity): 13 ng/L (ref <18)

## 2024-06-28 LAB — RESP PANEL BY RT-PCR (RSV, FLU A&B, COVID)  RVPGX2
Influenza A by PCR: NEGATIVE
Influenza B by PCR: NEGATIVE
Resp Syncytial Virus by PCR: NEGATIVE
SARS Coronavirus 2 by RT PCR: NEGATIVE

## 2024-06-28 LAB — MAGNESIUM: Magnesium: 2 mg/dL (ref 1.7–2.4)

## 2024-06-28 LAB — CBG MONITORING, ED: Glucose-Capillary: 276 mg/dL — ABNORMAL HIGH (ref 70–99)

## 2024-06-28 LAB — D-DIMER, QUANTITATIVE: D-Dimer, Quant: 0.32 ug{FEU}/mL (ref 0.00–0.50)

## 2024-06-28 MED ORDER — SODIUM CHLORIDE 0.9% FLUSH: 3.0000 mL | INTRAVENOUS | Status: DC | PRN

## 2024-06-28 MED ORDER — SODIUM CHLORIDE 0.9 % IV SOLN: 250.0000 mL | INTRAVENOUS | Status: AC | PRN

## 2024-06-28 MED ORDER — ACETAMINOPHEN 325 MG PO TABS: 650.0000 mg | ORAL_TABLET | Freq: Four times a day (QID) | ORAL | Status: DC | PRN

## 2024-06-28 MED ORDER — INSULIN ASPART 100 UNIT/ML IJ SOLN: 0.0000 [IU] | Freq: Every day | INTRAMUSCULAR | Status: DC

## 2024-06-28 MED ORDER — SODIUM CHLORIDE 0.9% FLUSH
3.0000 mL | Freq: Two times a day (BID) | INTRAVENOUS | Status: DC
  Administered 2024-06-28 – 2024-06-30 (×4): 3 mL via INTRAVENOUS

## 2024-06-28 MED ORDER — LAMOTRIGINE 25 MG PO TABS
25.0000 mg | ORAL_TABLET | Freq: Every day | ORAL | Status: DC
  Administered 2024-06-29 – 2024-07-01 (×3): 25 mg via ORAL
  Filled 2024-06-28 (×3): qty 1

## 2024-06-28 MED ORDER — NITROGLYCERIN 2 % TD OINT
1.0000 [in_us] | TOPICAL_OINTMENT | Freq: Once | TRANSDERMAL | Status: AC
  Administered 2024-06-28: 1 [in_us] via TOPICAL
  Filled 2024-06-28: qty 1

## 2024-06-28 MED ORDER — VITAMIN B-12 1000 MCG PO TABS
1000.0000 ug | ORAL_TABLET | Freq: Every day | ORAL | Status: DC
  Administered 2024-06-28: 1000 ug via ORAL
  Filled 2024-06-28: qty 1

## 2024-06-28 MED ORDER — INSULIN ASPART 100 UNIT/ML IJ SOLN
0.0000 [IU] | Freq: Three times a day (TID) | INTRAMUSCULAR | Status: DC
  Administered 2024-06-29: 3 [IU] via SUBCUTANEOUS
  Administered 2024-06-29: 5 [IU] via SUBCUTANEOUS

## 2024-06-28 MED ORDER — ACETAMINOPHEN 650 MG RE SUPP: 650.0000 mg | Freq: Four times a day (QID) | RECTAL | Status: DC | PRN

## 2024-06-28 MED ORDER — TAMSULOSIN HCL 0.4 MG PO CAPS
0.4000 mg | ORAL_CAPSULE | Freq: Every day | ORAL | Status: DC
  Administered 2024-06-29 – 2024-07-01 (×3): 0.4 mg via ORAL
  Filled 2024-06-28 (×3): qty 1

## 2024-06-28 MED ORDER — INSULIN GLARGINE-YFGN 100 UNIT/ML ~~LOC~~ SOLN: 36.0000 [IU] | Freq: Every day | SUBCUTANEOUS | Status: DC

## 2024-06-28 MED ORDER — PANTOPRAZOLE SODIUM 40 MG PO TBEC
40.0000 mg | DELAYED_RELEASE_TABLET | Freq: Every day | ORAL | Status: DC
  Administered 2024-06-29 – 2024-07-01 (×3): 40 mg via ORAL
  Filled 2024-06-28 (×3): qty 1

## 2024-06-28 MED ORDER — HYDRALAZINE HCL 20 MG/ML IJ SOLN
10.0000 mg | Freq: Four times a day (QID) | INTRAMUSCULAR | Status: DC | PRN
  Administered 2024-06-29: 10 mg via INTRAVENOUS
  Filled 2024-06-28: qty 1

## 2024-06-28 MED ORDER — INSULIN GLARGINE 100 UNIT/ML ~~LOC~~ SOLN
36.0000 [IU] | Freq: Every day | SUBCUTANEOUS | Status: DC
  Administered 2024-06-29: 36 [IU] via SUBCUTANEOUS
  Filled 2024-06-28 (×2): qty 0.36

## 2024-06-28 MED ORDER — QUETIAPINE FUMARATE 50 MG PO TABS
50.0000 mg | ORAL_TABLET | Freq: Every day | ORAL | Status: DC
  Administered 2024-06-28: 50 mg via ORAL
  Filled 2024-06-28: qty 2

## 2024-06-28 MED ORDER — INSULIN ASPART 100 UNIT/ML IJ SOLN
0.0000 [IU] | Freq: Every day | INTRAMUSCULAR | Status: DC
  Administered 2024-06-28: 3 [IU] via SUBCUTANEOUS

## 2024-06-28 MED ORDER — APIXABAN 2.5 MG PO TABS
2.5000 mg | ORAL_TABLET | Freq: Two times a day (BID) | ORAL | Status: DC
Start: 2024-06-28 — End: 2024-06-29
  Administered 2024-06-28: 2.5 mg via ORAL
  Filled 2024-06-28: qty 1

## 2024-06-28 MED ORDER — SODIUM CHLORIDE 0.9% FLUSH
3.0000 mL | Freq: Two times a day (BID) | INTRAVENOUS | Status: DC
  Administered 2024-06-28: 3 mL via INTRAVENOUS

## 2024-06-28 MED ORDER — NITROGLYCERIN IN D5W 200-5 MCG/ML-% IV SOLN
0.0000 ug/min | INTRAVENOUS | Status: DC
  Administered 2024-06-28: 25 ug/min via INTRAVENOUS
  Filled 2024-06-28: qty 250

## 2024-06-28 MED ORDER — DONEPEZIL HCL 10 MG PO TABS
10.0000 mg | ORAL_TABLET | Freq: Every day | ORAL | Status: DC
  Administered 2024-06-29 – 2024-06-30 (×2): 10 mg via ORAL
  Filled 2024-06-28 (×2): qty 1

## 2024-06-28 MED ORDER — LATANOPROST 0.005 % OP SOLN
1.0000 [drp] | Freq: Every day | OPHTHALMIC | Status: DC
  Administered 2024-06-29 – 2024-06-30 (×2): 1 [drp] via OPHTHALMIC
  Filled 2024-06-28: qty 2.5

## 2024-06-28 NOTE — ED Provider Notes (Signed)
 [place] EMERGENCY DEPARTMENT AT [facility] Provider Note   CSN: [id number] Arrival date & time: 06/28/24  1629     Patient presents with: Respiratory Distress and Shortness of Breath   Fernando Hess is a 78 y.o. male.    Shortness of Breath Patient presents for respiratory distress.  Medical history includes HTN, CAD, CHF, CKD, atrial fibrillation, HLD, nephrolithiasis, OSA, DM, anxiety.  Per chart review, he was last seen in the ED 4 weeks ago for shortness of breath and dizziness.  He declined admission at the time.  Today, onset of shortness of breath was this morning.  When EMS arrived on scene, patient had increased work of breathing, tachypnea, diminished breath sounds on lung auscultation.  He was given 1 DuoNeb.  He was placed on BiPAP.  He was never hypoxic with EMS.  He had SpO2 of around 90% on room air.  This increased with administration of CPAP.  His initial blood pressures were elevated in the range of 220s SBP.  This improved to 150s on CPAP.  He did receive 1 NTG prior to arrival.  Patient currently denies any areas of discomfort.  He does have ongoing dyspnea.     Prior to Admission medications   Medication Sig Start Date End Date Taking? Authorizing Provider  apixaban  (ELIQUIS ) 5 MG TABS tablet Take 1 tablet (5 mg total) by mouth 2 (two) times daily. 04/08/24   Hilty, Vinie BROCKS, MD  calcium  carbonate (TUMS) 500 MG chewable tablet Chew 1 tablet (200 mg of elemental calcium  total) by mouth 2 (two) times daily. 06/03/24   Patsey Lot, MD  Continuous Glucose Receiver (FREESTYLE LIBRE 3 READER) DEVI USE TO TEST BLOOD SUGAR 03/07/24   [provider]  Continuous Glucose Sensor (FREESTYLE LIBRE 3 PLUS SENSOR) MISC CHANGE EVERY 15 DAYS AS RECOMMENDED BY THE MANUFACTURER 90 DAYS 03/02/24   [provider]  cyanocobalamin  (VITAMIN B12) 1000 MCG/ML injection Inject 1,000 mcg into the muscle once a week. Inject on Saturday    [provider]  donepezil  (ARICEPT ) 10 MG tablet Take 5 mg by mouth at bedtime. Take 0.5 tablet (5mg ) by mouth daily for 30 days.    [provider]  furosemide  (LASIX ) 40 MG tablet Take 1 tablet (40 mg total) by mouth 3 (three) times a week. Take one tablet (40mg ) by mouth three times weekly on Monday, Wednesday, and Friday. 03/04/24   Patsy Lenis, MD  hydrALAZINE  (APRESOLINE ) 25 MG tablet Take 1 tablet (25 mg total) by mouth daily as needed (Hypertension). Take one tablet (25mg ) by mouth daily as needed if SBP is >160 03/04/24   Patsy Lenis, MD  insulin  lispro (HUMALOG ) 100 UNIT/ML KwikPen Inject 8 Units into the skin 3 (three) times daily before meals. 05/15/23   [provider]  lamoTRIgine  (LAMICTAL ) 25 MG tablet Take 25 mg by mouth daily. 02/18/24   [provider]  LANTUS  SOLOSTAR 100 UNIT/ML Solostar Pen Inject 36 Units into the skin daily before breakfast. 02/19/23   [provider]  latanoprost (XALATAN) 0.005 % ophthalmic solution Place 1 drop into both eyes at bedtime.    [provider]  MYRBETRIQ  25 MG TB24 tablet Take 25 mg by mouth at bedtime as needed (bladder control).    [provider]  ondansetron  (ZOFRAN -ODT) 4 MG disintegrating tablet Take 1 tablet (4 mg total) by mouth every 8 (eight) hours as needed. 03/06/24   Melvenia Motto, MD  pantoprazole  (PROTONIX ) 40 MG tablet TAKE  1 TABLET BY MOUTH EVERY DAY 11/25/19   Abran Norleen SAILOR, MD  polyethylene glycol powder (GLYCOLAX /MIRALAX ) 17 GM/SCOOP powder Take 17 g by mouth as needed for mild constipation or moderate constipation. 03/04/24   Patsy Lenis, MD  QUEtiapine  (SEROQUEL ) 50 MG tablet Take 50 mg by mouth at bedtime. 02/23/23   [provider]  tamsulosin  (FLOMAX ) 0.4 MG CAPS capsule Take 0.4 mg by mouth daily. 01/10/24   [provider]  tirzepatide CLOYDE) 2.5 MG/0.5ML Pen Inject 2.5 mg into the skin once a week. Inject on Tuesday    [provider]  TRADJENTA  5 MG  TABS tablet Take 5 mg by mouth daily.    [provider]    Allergies: Cilostazol , Magnesium -containing compounds, Donepezil , Dulaglutide, Levofloxacin, Liraglutide, and Lisinopril    Review of Systems  Constitutional:  Positive for fatigue.  Respiratory:  Positive for shortness of breath.   All other systems reviewed and are negative.   Updated Vital Signs BP (!) 156/81   Pulse 73   Temp 98.8 F (37.1 C) (Oral)   Resp 15   SpO2 100%   Physical Exam Vitals and nursing note reviewed.  Constitutional:      General: He is not in acute distress.    Appearance: He is well-developed. He is ill-appearing. He is not toxic-appearing or diaphoretic.  HENT:     Head: Normocephalic and atraumatic.     Mouth/Throat:     Mouth: Mucous membranes are moist.  Eyes:     Conjunctiva/sclera: Conjunctivae normal.  Cardiovascular:     Rate and Rhythm: Normal rate and regular rhythm.     Heart sounds: No murmur heard. Pulmonary:     Effort: Tachypnea and respiratory distress present.     Breath sounds: Decreased breath sounds present.  Chest:     Chest wall: No tenderness.  Abdominal:     Palpations: Abdomen is soft.     Tenderness: There is no abdominal tenderness.  Musculoskeletal:        General: No swelling.     Cervical back: Normal range of motion and neck supple.     Right lower leg: No tenderness. No edema.     Left lower leg: No tenderness. No edema.  Skin:    General: Skin is warm and dry.     Coloration: Skin is not cyanotic or pale.  Neurological:     General: No focal deficit present.     Mental Status: He is alert and oriented to person, place, and time.  Psychiatric:        Mood and Affect: Mood normal.        Behavior: Behavior normal.     (all labs ordered are listed, but only abnormal results are displayed) Labs Reviewed  BASIC METABOLIC PANEL WITH GFR - Abnormal; Notable for the following components:      Result Value   Glucose, Bld 312 (*)    BUN  29 (*)    Creatinine, Ser 2.24 (*)    Calcium  8.5 (*)    GFR, Estimated 29 (*)    All other components within normal limits  HEPATIC FUNCTION PANEL - Abnormal; Notable for the following components:   Total Protein 6.3 (*)    Albumin  3.0 (*)    All other components within normal limits  I-STAT CHEM 8, ED - Abnormal; Notable for the following components:   BUN 31 (*)    Creatinine, Ser 2.20 (*)    Glucose, Bld 315 (*)  All other components within normal limits  I-STAT VENOUS BLOOD GAS, ED - Abnormal; Notable for the following components:   pO2, Ven 84 (*)    All other components within normal limits  RESP PANEL BY RT-PCR (RSV, FLU A&B, COVID)  RVPGX2  CBC WITH DIFFERENTIAL/PLATELET  D-DIMER, QUANTITATIVE  BRAIN NATRIURETIC PEPTIDE  MAGNESIUM   URINALYSIS, ROUTINE W REFLEX MICROSCOPIC  TROPONIN I (HIGH SENSITIVITY)  TROPONIN I (HIGH SENSITIVITY)    EKG: EKG Interpretation Date/Time:  Tuesday June 28 2024 17:25:27 EDT Ventricular Rate:  81 PR Interval:    QRS Duration:  131 QT Interval:  425 QTC Calculation: 494 R Axis:   121  Text Interpretation: indeterminate rhythm RBBB and LPFB Abnormal T, consider ischemia, lateral leads Confirmed by Melvenia Motto [phone]) on 06/28/2024 5:29:41 PM  Radiology: [facility] Chest Port 1 View Result Date: 06/28/2024 EXAM: 1 VIEW XRAY OF THE CHEST 06/28/2024 04:47:00 PM COMPARISON: Comparison 06/03/2024 status post coronary artery bypass graft. CLINICAL HISTORY: Dyspnea. FINDINGS: LUNGS AND PLEURA: No focal pulmonary opacity. No pulmonary edema. No pleural effusion. No pneumothorax. HEART AND MEDIASTINUM: No acute abnormality of the cardiac and mediastinal silhouettes. BONES AND SOFT TISSUES: No acute osseous abnormality. IMPRESSION: 1. No acute cardiopulmonary process detected. Electronically signed by: Lynwood Seip MD 06/28/2024 04:56 PM EDT RP Workstation: [id number]     Procedures   Medications Ordered in the ED  nitroGLYCERIN  50 mg in dextrose   5 % 250 mL (0.2 mg/mL) infusion (50 mcg/min Intravenous Rate/Dose Change 06/28/24 2105)  nitroGLYCERIN  (NITROGLYN) 2 % ointment 1 inch (1 inch Topical Given 06/28/24 1928)                                    Medical Decision Making Amount and/or Complexity of Data Reviewed Labs: ordered. Radiology: ordered.  Risk Prescription drug management. Decision regarding hospitalization.   This patient presents to the ED for concern of shortness of breath, this involves an extensive number of treatment options, and is a complaint that carries with it a high risk of complications and morbidity.  The differential diagnosis includes CHF, hypertensive emergency, reactive airway disease, pneumonia, anemia, acidosis, myasthenia crisis   Co morbidities / Chronic conditions that complicate the patient evaluation  HTN, CAD, CHF, CKD, atrial fibrillation, HLD, nephrolithiasis, OSA, DM, anxiety, antibody negative myasthenia gravis   Additional history obtained:  Additional history obtained from EMR External records from outside source obtained and reviewed including EMS   Lab Tests:  I Ordered, and personally interpreted labs.  The pertinent results include: Baseline creatinine, normal electrolytes, no leukocytosis, normal D-dimer, normal troponin   Imaging Studies ordered:  I ordered imaging studies including chest x-ray, MRI brain I independently visualized and interpreted imaging which showed no acute findings on x-ray, MRI pending at time of admission I agree with the radiologist interpretation   Cardiac Monitoring: / EKG:  The patient was maintained on a cardiac monitor.  I personally viewed and interpreted the cardiac monitored which showed an underlying rhythm of: Atrial fibrillation   Problem List / ED Course / Critical interventions / Medication management  Patient presenting for shortness of breath.  This reportedly began this morning.  He arrives by EMS on CPAP.  On arrival,  patient is tachypneic.  He has shallow breaths with diminished breath sounds on lung auscultation.  He was placed on BiPAP.  Workup was initiated.  Patient's initial lab work was unremarkable.  X-ray of chest  did not show any acute findings.  He remains tolerating BiPAP well.  SpO2 was normal on 40% FiO2.  BNP was normal.  Patient has no history of reactive airway disease and no wheezing on lung auscultation.  Patient did have recurrence of hypertension and NTG was ordered.  His symptoms may be secondary to hypertension crisis.  He is labwork and imaging do not point to any other clear cause.  Given absence of clear etiology on workup, there is concern of possible myasthenic crisis.  Per chart review, he has been diagnosed with an antibody negative myasthenia gravis.  It does not appear that he is on any medications for this.  Neurology was consulted.  Patient was taken off BiPAP and does have improved work of breathing at this time.  SpO2 remains normal on room air.  Respiratory therapy check his NIF and FVC, both of which were suboptimal.  I spoke with neurologist on-call, Dr. Voncile, who recommends admission for observation.  His outpatient providers will need to be reached out tomorrow to determine what medications he is on.  Patient may require IVIG.  Dr. Voncile advises against steroids at this time, as this can worsen an acute crisis.  Every 4 hour NIF and FVC checks were ordered.  Patient is stable at this time.  Patient was admitted for further management. I ordered medication including NTG for HTN Reevaluation of the patient after these medicines showed that the patient improved I have reviewed the patients home medicines and have made adjustments as needed   Consultations Obtained:  I requested consultation with the neurologist, Dr. Voncile,  and discussed lab and imaging findings as well as pertinent plan - they recommend: Admission for observation.  Further neurology recommendations to  follow.   Social Determinants of Health:  Lives at home with wife     Final diagnoses:  Generalized weakness  Shortness of breath    ED Discharge Orders     None          Melvenia Motto, MD 06/28/24 2221

## 2024-06-28 NOTE — ED Triage Notes (Signed)
 PT to ER from home with CO sob and respiratory distress.  PT arrived on cpap and respiratory is at bedside to put pt on bipap.  EMS reports decreased breath sounds bilaterally. PT alert and oriented and able to give his name at this time. PT has a history of CHF.  PT received 1 nitroglycerin  from ems due to a very high bp.

## 2024-06-28 NOTE — ED Notes (Signed)
 I have asked pt several time to give urine sample. He repeatedly said he didn't need to I asked if we could do an in and out cath to obtain the urine and pt refused. Notified Dr. Melvenia

## 2024-06-28 NOTE — H&P (Addendum)
 History and Physical    Fernando Hess FMW:[id number] DOB: 1946/01/19 DOA: 06/28/2024  PCP: Okey Carlin Redbird, MD   Patient coming from: Home   Chief Complaint:  Chief Complaint  Patient presents with   Respiratory Distress   Shortness of Breath   ED TRIAGE note:  HPI:  Fernando Hess is a 78 y.o. 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  Patient reported that he does feeling sick with some kind of respiratory infection but denies any fever, chill, cough except shortness of breath. Patient reported he wake up in the morning found have difficulty breathing which has been getting worse. He has been brought to the ED via EMS.  Patient was never been hypoxic however due to work of breathing EMS placed BiPAP. Also patient found to be hypertensive systolic blood pressure about 799 range initially received Nitropaste and currently on nitro drip.  Denies any chest pain and palpitation. Patient denies any headache, blurry vision, swallowing difficulty fever, chill, UTI and upper respiratory tract symptoms.  Per chart review and documentation of neurology previously failed treatment with Mestinon and also treatment with IVIG in the past documented in the chart was not helpful which carries the concern for seronegative myasthenia crisis or psychogenic etiology of shortness of breath.  patient supposed to be follow-up with neuromuscular specialty however unable to find any record on the chart.  Patient has been following neurology at [facility] he has been offered Ultimoris however he declined.  They  recommended Keppra  for myoclonus jerk and vitamin B12 injection for idiopathic progressive neuropathy.   In the ED patient being evaluated by neurology Dr. Voncile and concern for myasthenia gravis exacerbation versus pseudo exacerbation of myasthenia symptoms in the setting of UTI versus brainstem pathology such as acute infraction.  Neurology recommendation following: Admit for observation in the stepdown unit hospitalist for now Every 6 hours NIF/FVC Check urinalysis to rule out any evidence of acute infection MRI brain without contrast to rule out brainstem infarct or pathology contributing to dyspnea.  I will also recommend reaching out to the outpatient neurology team to get some input in the morning. If the MRI is negative and there is no evidence of infection on UTI, consider IVIG infusions. Continue to replenish B12 Plan was discussed with Dr. Melvenia in detail, in person and then again over the phone after review of chart and evaluation of the patient.  At presentation to ED patient found hypertensive blood pressure 190/81 currently on nitro drip.  Blood pressure has been gradually improving 156/81.  Lab, CBC showing elevated blood glucose 312 otherwise unremarkable and renal function at baseline.  CBC unremarkable.   normal hepatic function.  Respiratory panel negative for COVID RSV flu.  Troponin unremarkable.  Normal mag level.  VBG unremarkable.  Normal D-dimer level.  Chest x-ray unremarkable.  Pending UA. Pending MRI of the brain.  Respiratory therapist calculated EF which is -4-.  FEV1/FVC 0.94.  Hospitalist consulted for further evaluation management of shortness of breath and hypertensive urgency.  Discussed with EDP Dr. Melvenia given neurology stating that need to rule out  stroke in that case it would not be appropriate to decrease the blood pressure very quickly and  blood pressure already has been improved 200 to 150 with nitro drip.  Nitro drip has been holding now.  Patient  declining to give urine sample also declined In-N-Out cath.  Patient is not on BiPAP currently maintaining O2 sat 100% room air.  Per chart review patient was admitted with similar presentation in June 2025.  He did not require any IVIG treatment.    Significant labs in the ED: Lab Orders         Resp panel by RT-PCR (RSV, Flu A&B, Covid) Anterior Nasal Swab         Basic metabolic panel         Hepatic function panel         CBC with Differential/Platelet         D-dimer, quantitative         Brain natriuretic peptide         Urinalysis, Routine w reflex microscopic -Urine, Clean Catch         Magnesium          Urinalysis, w/ Reflex to Culture (Infection Suspected) -Urine, Catheterized         CBC         Comprehensive metabolic panel         Vitamin B12         Lipid panel         I-Stat Chem 8, ED         I-Stat venous blood gas, ED         CBG monitoring, ED       Review of Systems:  Review of Systems  Constitutional:  Negative for chills, fever, malaise/fatigue and weight loss.  Eyes:  Negative for blurred vision and double vision.  Respiratory:  Positive for shortness of breath. Negative for cough.   Cardiovascular:  Negative for chest pain, palpitations, orthopnea, leg swelling and PND.  Gastrointestinal:  Negative for heartburn, nausea and vomiting.  Genitourinary:  Negative for dysuria, flank pain, frequency, hematuria and urgency.  Musculoskeletal:  Negative for falls and joint pain.  Neurological:  Positive for weakness. Negative for dizziness, tingling, tremors, sensory change, speech change, focal weakness and headaches.       Generalized weakness and fatigue  Psychiatric/Behavioral:  Negative for depression, hallucinations and memory loss. The patient is nervous/anxious and has insomnia.     Past Medical History:  Diagnosis Date   Acute renal failure (ARF) 09/24/2015   Acute stress disorder 11/13/2021   Atrial premature complexes    CAD (coronary artery  disease) of artery bypass graft    Early occlusion of saphenous vein graft to intermediate and marginal branch in February 2007 following bypass grafting    CAD (coronary artery disease), native coronary artery 2017   hx NSTEMI 09-24-2015  s/p  CABG x5 on 10-02-2015;  post op STEMI inferolateral wall,  SVG OM1 and SVG OM2 occluded, distal OM occlusion the calpruit, treated medically // Myoview  7/21: no ischemia, EF 65, low risk   CHF (congestive heart failure) ([name])    CKD (chronic kidney disease), stage III ([name])    Contusion of right knee 03/16/2020   Dyspnea    Elevated troponin    Erectile dysfunction    Esophageal reflux    History of atrial fibrillation    post op CABG 10-02-2015   History of kidney stones    History of non-ST elevation myocardial infarction (NSTEMI) 09/24/2015   s/p  CABG x5   History of ST elevation myocardial  infarction (STEMI) 10/22/2015   inferior wall,  post op CABG 10-02-2015   Hyperlipidemia    Hypertension    Left ureteral stone    Mild atherosclerosis of both carotid arteries    Nephrolithiasis    per CT bilateral non-obstructive calculi   OSA (obstructive sleep apnea)    Peripheral artery disease    LE Arterial US  01/2019: R PTA and ATA occluded; L ATA occluded   RBBB (right bundle branch block)    Renal atrophy, right    Sleep apnea    wears cpap    ST elevation myocardial infarction (STEMI) of inferior wall ([name]) 10/22/2015   Type 2 diabetes mellitus treated with insulin  ([name])    followed by pcp   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) 03/01/2008   Wears glasses     Past Surgical History:  Procedure Laterality Date   APPENDECTOMY  1965   CARDIAC CATHETERIZATION N/A 09/26/2015   Procedure: Left Heart Cath and Coronary Angiography;  Surgeon: Debby DELENA Sor, MD;  Location: [facility] INVASIVE CV LAB;  Service: Cardiovascular;  Laterality: N/A;   CARDIAC CATHETERIZATION N/A  10/22/2015   Procedure: Left Heart Cath and Coronary Angiography;  Surgeon: Ozell Fell, MD;  Location: [facility] INVASIVE CV LAB;  Service: Cardiovascular;  Laterality: N/A;   CATARACT EXTRACTION W/ INTRAOCULAR LENS  IMPLANT, BILATERAL  2017   COLONOSCOPY     CORONARY ARTERY BYPASS GRAFT N/A 10/02/2015   Procedure: CORONARY ARTERY BYPASS GRAFTING (CABG) X5 LIMA-LAD; SVG-DIAG; SVG-OM; SVG-PD; SVG-RAMUS TRANSESOPHAGEAL ECHOCARDIOGRAM (TEE) ENDOSCOPIC GREATER SAPHENOUS VEIN  HARVEST BILAT LE;  Surgeon: Maude Fleeta Ochoa, MD;  Location: [facility] OR;  Service: Open Heart Surgery;  Laterality: N/A;   CYSTOSCOPY/URETEROSCOPY/HOLMIUM LASER/STENT PLACEMENT Left 08/10/2018   Procedure: CYSTOSCOPY/URETEROSCOPY/HOLMIUM LASER/STENT PLACEMENT;  Surgeon: Nieves Cough, MD;  Location: [facility];  Service: Urology;  Laterality: Left;   CYSTOSCOPY/URETEROSCOPY/HOLMIUM LASER/STENT PLACEMENT Left 09/10/2018   Procedure: CYSTOSCOPY/URETEROSCOPY/HOLMIUM LASER/STENT EXCHANGE;  Surgeon: Nieves Cough, MD;  Location: [facility] ORS;  Service: Urology;  Laterality: Left;   HIP ARTHROPLASTY Right 12/26/2022   Procedure: RIGHT HEMI HIP ARTHROPLASTY;  Surgeon: Edna Toribio DELENA, MD;  Location: [facility] OR;  Service: Orthopedics;  Laterality: Right;   LEFT HEART CATHETERIZATION WITH CORONARY ANGIOGRAM N/A 04/13/2014   Procedure: LEFT HEART CATHETERIZATION WITH CORONARY ANGIOGRAM;  Surgeon: Elsie GORMAN Somerset, MD;  Location: [facility] CATH LAB;  Service: Cardiovascular;  Laterality: N/A;   LEG SURGERY Right age 79   closed reduction leg fracture   NASAL SEPTOPLASTY W/ TURBINOPLASTY Bilateral 08/30/2021   Procedure: NASAL SEPTOPLASTY WITH BILATERAL INFERIOR TURBINATE REDUCTION;  Surgeon: Mable Lenis, MD;  Location: [facility] OR;  Service: ENT;  Laterality: Bilateral;   POLYPECTOMY     RIGHT HEART CATH N/A 06/06/2021   Procedure: RIGHT HEART CATH;  Surgeon: Cherrie Toribio SAUNDERS, MD;  Location: [facility] INVASIVE CV LAB;  Service: Cardiovascular;   Laterality: N/A;   TEE WITHOUT CARDIOVERSION N/A 10/02/2015   Procedure: TRANSESOPHAGEAL ECHOCARDIOGRAM (TEE);  Surgeon: Maude Fleeta Ochoa, MD;  Location[facility];  Service: Open Heart Surgery;  Laterality: N/A;   URETEROSCOPY WITH HOLMIUM LASER LITHOTRIPSY Bilateral 2004;  2005  dr grapey  @[facility]    VASECTOMY       reports that he has never smoked. He has never used smokeless tobacco. He reports that he does not currently use alcohol . He reports that he does not use drugs.  Allergies  Allergen Reactions   Cilostazol  Swelling and Other (  See Comments)    Edema    Magnesium -Containing Compounds Other (See Comments)    Avoid magnesium  with hx myasthenia gravis (can worsen muscle weakness and trigger a severe exacerbation)   Dulaglutide Nausea And Vomiting and Other (See Comments)    TRULICITY   Levofloxacin Hives, Itching and Rash   Liraglutide Other (See Comments)    Severe fatigue & insomnia   Lisinopril Itching, Rash and Cough    Family History  Problem Relation Age of Onset   Heart attack Mother    Heart attack Father    Diabetes Brother    Pancreatic cancer Brother    Diabetes Brother    Colon cancer Neg Hx    Esophageal cancer Neg Hx    Prostate cancer Neg Hx    Rectal cancer Neg Hx    Stomach cancer Neg Hx    Colon polyps Neg Hx     Prior to Admission medications   Medication Sig Start Date End Date Taking? Authorizing Provider  apixaban  (ELIQUIS ) 5 MG TABS tablet Take 1 tablet (5 mg total) by mouth 2 (two) times daily. 04/08/24   Hilty, Vinie BROCKS, MD  calcium  carbonate (TUMS) 500 MG chewable tablet Chew 1 tablet (200 mg of elemental calcium  total) by mouth 2 (two) times daily. 06/03/24   Patsey Lot, MD  Continuous Glucose Receiver (FREESTYLE LIBRE 3 READER) DEVI USE TO TEST BLOOD SUGAR 03/07/24   [provider]  Continuous Glucose Sensor (FREESTYLE LIBRE 3 PLUS SENSOR) MISC CHANGE EVERY 15 DAYS AS RECOMMENDED BY THE MANUFACTURER 90 DAYS 03/02/24   [provider]  cyanocobalamin  (VITAMIN B12) 1000 MCG/ML injection Inject 1,000 mcg into the muscle once a week. Inject on Saturday    [provider]  donepezil  (ARICEPT ) 10 MG tablet Take 5 mg by mouth at bedtime. Take 0.5 tablet (5mg ) by mouth daily for 30 days.    [provider]  furosemide  (LASIX ) 40 MG tablet Take 1 tablet (40 mg total) by mouth 3 (three) times a week. Take one tablet (40mg ) by mouth three times weekly on Monday, Wednesday, and Friday. 03/04/24   Patsy Lenis, MD  hydrALAZINE  (APRESOLINE ) 25 MG tablet Take 1 tablet (25 mg total) by mouth daily as needed (Hypertension). Take one tablet (25mg ) by mouth daily as needed if SBP is >160 03/04/24   Patsy Lenis, MD  insulin  lispro (HUMALOG ) 100 UNIT/ML KwikPen Inject 8 Units into the skin 3 (three) times daily before meals. 05/15/23   [provider]  lamoTRIgine  (LAMICTAL ) 25 MG tablet Take 25 mg by mouth daily. 02/18/24   [provider]  LANTUS  SOLOSTAR 100 UNIT/ML Solostar Pen Inject 36 Units into the skin daily before breakfast. 02/19/23   [provider]  latanoprost (XALATAN) 0.005 % ophthalmic solution Place 1 drop into both eyes at bedtime.    [provider]  MYRBETRIQ  25 MG TB24 tablet Take 25 mg by mouth at bedtime as needed (bladder control).    [provider]  ondansetron  (ZOFRAN -ODT) 4 MG disintegrating tablet Take 1 tablet (4 mg total) by mouth every 8 (eight) hours as needed. 03/06/24   Melvenia Motto, MD  pantoprazole  (PROTONIX ) 40 MG tablet TAKE 1 TABLET BY MOUTH EVERY DAY 11/25/19   Abran Norleen SAILOR, MD  polyethylene glycol powder (GLYCOLAX /MIRALAX ) 17 GM/SCOOP powder Take 17 g by mouth as needed for mild constipation or moderate constipation. 03/04/24   Patsy Lenis, MD  QUEtiapine  (SEROQUEL ) 50 MG tablet Take 50 mg by mouth at  bedtime. 02/23/23   [provider]  tamsulosin  (FLOMAX ) 0.4 MG CAPS capsule Take 0.4 mg by mouth daily. 01/10/24   [provider]  tirzepatide CLOYDE) 2.5 MG/0.5ML Pen Inject 2.5 mg into the skin once a week. Inject on Tuesday    [provider]  TRADJENTA  5 MG TABS tablet Take 5 mg by mouth daily.    [provider]     Physical Exam: Vitals:   06/29/24 0200 06/29/24 0215 06/29/24 0230 06/29/24 0245  BP: (!) 167/103 (!) 167/80 (!) 157/84 (!) 149/81  Pulse: 84 80 83 80  Resp: 19 19 18 20   Temp:      TempSrc:      SpO2: 100% 97% 97% 97%    Physical Exam Vitals and nursing note reviewed.  Constitutional:      General: He is not in acute distress.    Appearance: He is well-developed. He is obese. He is not ill-appearing.  Cardiovascular:     Rate and Rhythm: Normal rate. Rhythm irregular.  Pulmonary:     Effort: Pulmonary effort is normal.     Breath sounds: Normal breath sounds. No decreased breath sounds, wheezing, rhonchi or rales.  Skin:    Capillary Refill: Capillary refill takes less than 2 seconds.  Neurological:     Mental Status: He is alert and oriented to person, place, and time.  Psychiatric:        Mood and Affect: Mood normal.      Labs on Admission: I have personally reviewed following labs and imaging studies  CBC: Recent Labs  Lab 06/28/24 1655 06/28/24 1726  WBC 9.2  --   NEUTROABS 6.3  --   HGB 13.0 13.6  13.9  HCT 41.8 40.0  41.0  MCV 84.3  --   PLT 240  --    Basic Metabolic Panel: Recent Labs  Lab 06/28/24 1655 06/28/24 1726  NA 137 139  139  K 4.2 4.2  4.3  CL 104 105  CO2 23  --   GLUCOSE 312* 315*  BUN 29* 31*  CREATININE 2.24* 2.20*  CALCIUM  8.5*  --   MG 2.0  --    GFR: Estimated Creatinine Clearance: 27.8 mL/min (A) (by C-G formula based on SCr of 2.2 mg/dL (H)). Liver Function Tests: Recent Labs  Lab 06/28/24 1655  AST 17  ALT 11  ALKPHOS 107  BILITOT 0.8  PROT 6.3*  ALBUMIN  3.0*   No results for input(s): LIPASE, AMYLASE in the last 168 hours. No results for input(s): AMMONIA in the last 168  hours. Coagulation Profile: No results for input(s): INR, PROTIME in the last 168 hours. Cardiac Enzymes: Recent Labs  Lab 06/28/24 1655 06/28/24 1840  TROPONINIHS 9 13   BNP (last 3 results) Recent Labs    03/06/24 1712 06/03/24 1732 06/28/24 1655  BNP 98.3 142.0* 91.7   HbA1C: No results for input(s): HGBA1C in the last 72 hours. CBG: Recent Labs  Lab 06/28/24 2349  GLUCAP 276*   Lipid Profile: No results for input(s): CHOL, HDL, LDLCALC, TRIG, CHOLHDL, LDLDIRECT in the last 72 hours. Thyroid  Function Tests: No results for input(s): TSH, T4TOTAL, FREET4, T3FREE, THYROIDAB in the last 72 hours. Anemia Panel: No results for input(s): VITAMINB12, FOLATE, FERRITIN, TIBC, IRON, RETICCTPCT in the last 72 hours. Urine analysis:    Component Value Date/Time   COLORURINE YELLOW 03/06/2024 1619   APPEARANCEUR HAZY (A) 03/06/2024 1619   LABSPEC 1.023 03/06/2024 1619   PHURINE 5.0 03/06/2024  1619   GLUCOSEU NEGATIVE 03/06/2024 1619   HGBUR NEGATIVE 03/06/2024 1619   BILIRUBINUR NEGATIVE 03/06/2024 1619   KETONESUR 5 (A) 03/06/2024 1619   PROTEINUR 30 (A) 03/06/2024 1619   UROBILINOGEN 0.2 10/02/2009 1604   NITRITE NEGATIVE 03/06/2024 1619   LEUKOCYTESUR TRACE (A) 03/06/2024 1619    Radiological Exams on Admission: I have personally reviewed images MR BRAIN WO CONTRAST Result Date: 06/29/2024 EXAM: MRI BRAIN WITHOUT CONTRAST 06/29/2024 12:57:00 AM TECHNIQUE: Multiplanar multisequence MRI of the head/brain was performed without the administration of intravenous contrast. COMPARISON: Comparison made with prior CT from 07/15/2023. CLINICAL HISTORY: FINDINGS: BRAIN AND VENTRICLES: Examination degraded by motion artifact. Subtle small focus of restricted diffusion seen involving the right paramedian midbrain, suspicious for a tiny acute ischemic nonhemorrhagic infarct (series 5, image [id number]). No associated hemorrhage or mass effect. Mild age  related cerebral atrophy. Patchy T2 x FLAIR hyperintensities involving the periventricular and deep white matter of the cerebral hemispheres, consistent with chronic small vessel ischemic disease, mild to moderate in nature. Few small remote right cerebellar infarcts. Chronic lacunar infarct present at the right basal ganglia. No intracranial hemorrhage. No mass. No midline shift. No hydrocephalus. The sella is unremarkable. Normal flow voids. ORBITS: Prior bilateral ocular lens replacement. SINUSES AND MASTOIDS: Scattered mucosal thickening present about the ethmoid air cells and maxillary sinuses. BONES AND SOFT TISSUES: Normal marrow signal. No acute soft tissue abnormality. IMPRESSION: 1. Subtle small focus of restricted diffusion involving the right paramedian midbrain, suspicious for a small acute ischemic nonhemorrhagic infarct. 2. Underlying age-related atrophy with mild to moderate chronic microvascular ischemic disease. Small remote lacunar infarcts at the right basal ganglia and right cerebellum. Electronically signed by: Morene Hoard MD 06/29/2024 01:43 AM EDT RP Workstation: [id number]   DG Chest Port 1 View Result Date: 06/28/2024 EXAM: 1 VIEW XRAY OF THE CHEST 06/28/2024 04:47:00 PM COMPARISON: Comparison 06/03/2024 status post coronary artery bypass graft. CLINICAL HISTORY: Dyspnea. FINDINGS: LUNGS AND PLEURA: No focal pulmonary opacity. No pulmonary edema. No pleural effusion. No pneumothorax. HEART AND MEDIASTINUM: No acute abnormality of the cardiac and mediastinal silhouettes. BONES AND SOFT TISSUES: No acute osseous abnormality. IMPRESSION: 1. No acute cardiopulmonary process detected. Electronically signed by: Lynwood Seip MD 06/28/2024 04:56 PM EDT RP Workstation: [id number]     EKG: My personal interpretation of EKG shows:r intermittent rhythm heart rate 81, right bundle branch block and left posterior fascicular block    Assessment/Plan: Principal Problem:   Shortness of  breath Active Problems:   History of seronegative myasthenia gravis ([name])   Paroxysmal atrial fibrillation ([name])   Insulin  dependent type 2 diabetes mellitus ([name])   Obstructive sleep apnea   CKD (chronic kidney disease), stage IV ([name])   SOB (shortness of breath)   History of anxiety   History of CAD (coronary artery disease)   History of nocturnal hypoxia ([name])    Assessment and Plan: Shortness of breath History of possible seronegative myasthenia gravis ? Idiopathic progressive neuropathy -Presented emergency department concern for shortness of breath that started today morning with associated generalized weakness.  Patient O2 sat never dropped below 90% however he continued to have shortness of breath and EMS initially placed BiPAP for comfort.  Patient does not have any lower extremity swelling.  Normal troponin, BNP and chest x-ray no evidence of pulmonary edema - Initially patient was placed on BiPAP however currently has been transition to nasal cannula oxygen maintaining 100% room air. - At presentation to ED patient found hypertensive systolic blood pressure  200 which improved to upper 150s with Nitropress 15 mg followed by patient was on nitro drip and later on nitro drip has been this continued at 10:30 PM as neurology recommended obtain MRI to rule out acute CVA. -Lab work, CBC unremarkable.  Negative respiratory panel.  Normal D-dimer.  Normal BNP.  Normal troponin.  Normal VBG.  Normal hepatic function.  BMP showing elevated blood glucose 312 and elevated creatinine 2.24 GFR 29 prerenal function at baseline. - Negative inspiratory force is 40 and FEV1/FVC 0.94.  Patient does not meet criteria for myasthenia gravis related respiratory distress/crisis. - In the ED patient being evaluated by neurology Dr. Voncile recommended to obtain MRI of the brain to rule out any brainstem infection or pathology that contributing to dyspnea.  And UA to rule out UTI.  If MRI brain negative and no  evidence of UTI in that case neurology will consult IVIG infusion.  Recommended continue replenishing B12.  Every 6 hour NIF/FVC -MRI has been ordered. - Ordered UA with urine culture however patient declining UA and In-N-Out cath. -Continue oral B12 supplement 1000 mcg daily - History of OSA continue CPAP at bedtime - Blood pressure already has been improved.  Holding nitro drip given MRI pending to rule out acute CVA. -Continue check pulse ox.  And as needed supplemental oxygen.  Currently not requiring BiPAP O2 sat 100% room air.  BiPAP by Sten if requires.  Continue CPAP at bedtime for management of OSA.  Continue spirometry every 4 hours. -Neurology to continue to follow and appreciate input. Addendum- Acute ischemic CVA - MRI showing acute ischemic nonhemorrhagic infarction involving the right paramedian midbrain. Discussed case with on-call neurology Dr.Arora recommended to hold of Eliquis  as high risk for hemorrhagic transformation next couple of days.  Neurology will evaluate to decide timeline to initiate DOAC. -Neurology recommended MRA head without contrast, carotid Dopplers, also will need 2D echo, telemetry, therapy assessments. Hold Eliquis  for now. Aspirin  81 for now. Stroke team will follow  -Obtaining MRI head, carotid ultrasound, starting aspirin  81 mg daily, obtain echocardiogram.  Holding Eliquis .  Continue neurocheck per stroke protocol.  Consulted PT-OT.  Patient does not have any swallowing difficulty diet has been ordered.  Vitamin B12 deficiency - Continue oral B12 1000 mcg daily   Hypertensive urgency-resolved Essential hypertension History of HFpEF - EMS reported patient blood pressure rose to heart rate range and at presentation to ED systolic blood pressure 190 patient initially received Nitropaste 15 mg and placed on nitro drip blood pressure improved to 150/81.  Normal BNP.  Chest extremities of large pulmonary edema.  Given blood pressure has been already  improved and pending MRI to rule out acute CVA holding nitro drip at this time. -Obtain echocardiogram - If MRI rules out stroke and blood pressure tends to getting higher again will initiate on blood pressure regimen (takes hydralazine  25 mg 3 times daily as needed and Lasix  40 mg 3 times daily weekly).   Insulin -dependent DM type II -Elevated blood glucose.  Continue moderate SSI with mealtime, glargine 36 unit daily.  Obstructive sleep apnea on CPAP History of nocturnal hypoxia Continue CPAP at bedtime  History of anxiety History of depression During my evaluation at the bedside patient reported he is feeling anxious and wanted to keep the door open and want to see people walking in the hallway. -Continue Seroquel  and Lamictal   History of paroxysmal atrial fibrillation -Given patient has advanced CKD and consulting pharmacy for Eliquis  dose adjustment.  EKG showing  irregular rhythm rate controlled.  Unable to initiate any beta-blocker in the setting of concern for bowel myasthenia gravis crisis. Addendum - Chest MRI showing concern for stroke neurology reviewed the imaging and concern for patient might develop hemorrhagic conversion and recommended to hold of the Eliquis .  Currently starting aspirin  81 mg daily.   History of CAD -Patient is not on any beta-blocker given history of myasthenia gravis.   BPH - Continue Flomax   History of dementia - Continue donepezil   History of glucoma - Continue latanoprost  CKD stage IV - Creatinine 2.24 and GFR 29.  Baseline creatinine around 2.7-2.3.  Renal function at baseline.  Continue to monitor renal function.  Avoid nephrotoxic agent.  Renally adjust medications.   DVT prophylaxis:  Eliquis  Code Status:  Full Code Diet: Heart healthy carb modified Disposition Plan: MRI of the brain and UA result. Consults: Neurology Admission status:   Inpatient, Step Down Unit  Severity of Illness: The appropriate patient status for this  patient is INPATIENT. Inpatient status is judged to be reasonable and necessary in order to provide the required intensity of service to ensure the patient's safety. The patient's presenting symptoms, physical exam findings, and initial radiographic and laboratory data in the context of their chronic comorbidities is felt to place them at high risk for further clinical deterioration. Furthermore, it is not anticipated that the patient will be medically stable for discharge from the hospital within 2 midnights of admission.   * I certify that at the point of admission it is my clinical judgment that the patient will require inpatient hospital care spanning beyond 2 midnights from the point of admission due to high intensity of service, high risk for further deterioration and high frequency of surveillance required.DEWAINE    Ema Hebner, MD [facility]  How to contact the [facility] Attending or Consulting provider 7A - 7P or covering provider during after hours 7P -7A, for this patient.  Check the care team in [facility] and look for a) attending/consulting [facility] provider listed and b) the [facility] team listed Log into [email or web address] and use [facility]'s universal password to access. If you do not have the password, please contact the hospital operator. Locate the [facility] provider you are looking for under [facility] and page to a number that you can be directly reached. If you still have difficulty reaching the provider, please page the [facility] (Director on Call) for the Hospitalists listed on amion for assistance.  06/29/2024, 3:08 AM

## 2024-06-28 NOTE — Consult Note (Signed)
 NEUROLOGY CONSULT NOTE   Date of service: June 28, 2024 Patient Name: Fernando Hess MRN:  [id number] DOB:  [birth year] Chief Complaint: Worsening shortness of breath and generalized weakness Requesting Provider: Melvenia Motto, MD  History of Present Illness  Fernando Hess is a 78 y.o. male with hx of coronary artery disease, CHF, CKD 3, atrial fibrillation on Eliquis , amongst other comorbidities as well as concern for seronegative myasthenia gravis without a clear established diagnosis of the above said myasthenia-failed treatment with Mestinon and also not very helpful treatment with IVIG in the past documented in the chart, also concern for freezing episodes concerning for psychogenic etiology presents for evaluation of worsening shortness of breath since this morning.  Does not feel that he had been sick with any respiratory infection, does not report any fever or chills.  Reports upon waking up this morning had difficulty breathing which has since worsened. He was brought in by EMS.  Never very hypoxic but had increased work of breathing, tachypnea and diminished breath sounds on the ED provider lung auscultation.  He was put on BiPAP.  He also had hypertensive vitals with systolic in the 200s. He denies chest pain. Denies headache. Denies trouble swallowing Denies preceding fevers chills or respiratory illness. Review of his chart reveals that he has been recommended to see neuromuscular specialist but I am not able to see any clear results of any EMG nerve conduction studies or single-fiber EMG. I do see some notes from [facility] where he does say that he has myasthenia gravis with failed Mestinon and prednisone and not wanting to go on Ultimoris that was recommended.  They also recommend that he be on Keppra  for myoclonus and B12 injections for idiopathic progressive neuropathy.  Last known well-unclear-sometime the night prior to  presentation TNK-no-unclear last known well Thrombectomy-no-unclear last known well NIH stroke scale: 3 (1 point each for not being able to tell the correct month, and drift in both legs)   ROS  Comprehensive ROS performed and pertinent positives documented in HPI   Past History   Past Medical History:  Diagnosis Date   Acute renal failure (ARF) 09/24/2015   Acute stress disorder 11/13/2021   Atrial premature complexes    CAD (coronary artery disease) of artery bypass graft    Early occlusion of saphenous vein graft to intermediate and marginal branch in February 2007 following bypass grafting    CAD (coronary artery disease), native coronary artery 2017   hx NSTEMI 09-24-2015  s/p  CABG x5 on 10-02-2015;  post op STEMI inferolateral wall,  SVG OM1 and SVG OM2 occluded, distal OM occlusion the calpruit, treated medically // Myoview  7/21: no ischemia, EF 65, low risk   CHF (congestive heart failure) ([name])    CKD (chronic kidney disease), stage III ([name])    Contusion of right knee 03/16/2020   Dyspnea    Elevated troponin    Erectile dysfunction    Esophageal reflux    History of atrial fibrillation    post op CABG 10-02-2015   History of kidney stones    History of non-ST elevation myocardial infarction (NSTEMI) 09/24/2015   s/p  CABG x5   History of ST elevation myocardial infarction (STEMI) 10/22/2015   inferior wall,  post op CABG 10-02-2015   Hyperlipidemia    Hypertension    Left ureteral stone    Mild atherosclerosis of both carotid arteries    Nephrolithiasis    per CT bilateral non-obstructive calculi  OSA (obstructive sleep apnea)    Peripheral artery disease    LE Arterial US  01/2019: R PTA and ATA occluded; L ATA occluded   RBBB (right bundle branch block)    Renal atrophy, right    Sleep apnea    wears cpap    ST elevation myocardial infarction (STEMI) of inferior wall ([name]) 10/22/2015   Type 2 diabetes mellitus treated with insulin  ([name])    followed by pcp    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) 03/01/2008   Wears glasses     Past Surgical History:  Procedure Laterality Date   APPENDECTOMY  1965   CARDIAC CATHETERIZATION N/A 09/26/2015   Procedure: Left Heart Cath and Coronary Angiography;  Surgeon: Debby DELENA Sor, MD;  Location: [facility] INVASIVE CV LAB;  Service: Cardiovascular;  Laterality: N/A;   CARDIAC CATHETERIZATION N/A 10/22/2015   Procedure: Left Heart Cath and Coronary Angiography;  Surgeon: Ozell Fell, MD;  Location: [facility] INVASIVE CV LAB;  Service: Cardiovascular;  Laterality: N/A;   CATARACT EXTRACTION W/ INTRAOCULAR LENS  IMPLANT, BILATERAL  2017   COLONOSCOPY     CORONARY ARTERY BYPASS GRAFT N/A 10/02/2015   Procedure: CORONARY ARTERY BYPASS GRAFTING (CABG) X5 LIMA-LAD; SVG-DIAG; SVG-OM; SVG-PD; SVG-RAMUS TRANSESOPHAGEAL ECHOCARDIOGRAM (TEE) ENDOSCOPIC GREATER SAPHENOUS VEIN  HARVEST BILAT LE;  Surgeon: Maude Fleeta Ochoa, MD;  Location: [facility] OR;  Service: Open Heart Surgery;  Laterality: N/A;   CYSTOSCOPY/URETEROSCOPY/HOLMIUM LASER/STENT PLACEMENT Left 08/10/2018   Procedure: CYSTOSCOPY/URETEROSCOPY/HOLMIUM LASER/STENT PLACEMENT;  Surgeon: Nieves Cough, MD;  Location: [facility];  Service: Urology;  Laterality: Left;   CYSTOSCOPY/URETEROSCOPY/HOLMIUM LASER/STENT PLACEMENT Left 09/10/2018   Procedure: CYSTOSCOPY/URETEROSCOPY/HOLMIUM LASER/STENT EXCHANGE;  Surgeon: Nieves Cough, MD;  Location: [facility]S;  Service: Urology;  Laterality: Left;   HIP ARTHROPLASTY Right 12/26/2022   Procedure: RIGHT HEMI HIP ARTHROPLASTY;  Surgeon: Edna Toribio DELENA, MD;  Location: [facility] OR;  Service: Orthopedics;  Laterality: Right;   LEFT HEART CATHETERIZATION WITH CORONARY ANGIOGRAM N/A 04/13/2014   Procedure: LEFT HEART CATHETERIZATION WITH CORONARY ANGIOGRAM;  Surgeon: Elsie GORMAN Somerset, MD;  Location[facility] CATH LAB;  Service: Cardiovascular;  Laterality: N/A;    LEG SURGERY Right age 84   closed reduction leg fracture   NASAL SEPTOPLASTY W/ TURBINOPLASTY Bilateral 08/30/2021   Procedure: NASAL SEPTOPLASTY WITH BILATERAL INFERIOR TURBINATE REDUCTION;  Surgeon: Mable Lenis, MD;  Location[facility] OR;  Service: ENT;  Laterality: Bilateral;   POLYPECTOMY     RIGHT HEART CATH N/A 06/06/2021   Procedure: RIGHT HEART CATH;  Surgeon: Cherrie Toribio SAUNDERS, MD;  Location: [facility] INVASIVE CV LAB;  Service: Cardiovascular;  Laterality: N/A;   TEE WITHOUT CARDIOVERSION N/A 10/02/2015   Procedure: TRANSESOPHAGEAL ECHOCARDIOGRAM (TEE);  Surgeon: Maude Fleeta Ochoa, MD;  Location: [facility] OR;  Service: Open Heart Surgery;  Laterality: N/A;   URETEROSCOPY WITH HOLMIUM LASER LITHOTRIPSY Bilateral 2004;  2005  dr grapey  @[facility]    VASECTOMY      Family History: Family History  Problem Relation Age of Onset   Heart attack Mother    Heart attack Father    Diabetes Brother    Pancreatic cancer Brother    Diabetes Brother    Colon cancer Neg Hx    Esophageal cancer Neg Hx    Prostate cancer Neg Hx    Rectal cancer Neg Hx    Stomach cancer Neg Hx    Colon polyps Neg Hx     Social History  reports that he  has never smoked. He has never used smokeless tobacco. He reports that he does not currently use alcohol . He reports that he does not use drugs.  Allergies  Allergen Reactions   Cilostazol  Swelling and Other (See Comments)    Edema    Magnesium -Containing Compounds Other (See Comments)    Avoid magnesium  with hx myasthenia gravis (can worsen muscle weakness and trigger a severe exacerbation)   Donepezil  Other (See Comments)    seizure   Dulaglutide Nausea And Vomiting and Other (See Comments)    TRULICITY   Levofloxacin Hives, Itching and Rash   Liraglutide Other (See Comments)    Severe fatigue & insomnia   Lisinopril Itching, Rash and Cough    Medications   Current Facility-Administered Medications:    nitroGLYCERIN  50 mg in dextrose  5 % 250 mL (0.2 mg/mL)  infusion, 0-200 mcg/min, Intravenous, Continuous, Dixon, Ryan, MD, Last Rate: 15 mL/hr at 06/28/24 2105, 50 mcg/min at 06/28/24 2105  Current Outpatient Medications:    apixaban  (ELIQUIS ) 5 MG TABS tablet, Take 1 tablet (5 mg total) by mouth 2 (two) times daily., Disp: 180 tablet, Rfl: 3   calcium  carbonate (TUMS) 500 MG chewable tablet, Chew 1 tablet (200 mg of elemental calcium  total) by mouth 2 (two) times daily., Disp: 8 tablet, Rfl: 0   Continuous Glucose Receiver (FREESTYLE LIBRE 3 READER) DEVI, USE TO TEST BLOOD SUGAR, Disp: , Rfl:    Continuous Glucose Sensor (FREESTYLE LIBRE 3 PLUS SENSOR) MISC, CHANGE EVERY 15 DAYS AS RECOMMENDED BY THE MANUFACTURER 90 DAYS, Disp: , Rfl:    cyanocobalamin  (VITAMIN B12) 1000 MCG/ML injection, Inject 1,000 mcg into the muscle once a week. Inject on Saturday, Disp: , Rfl:    donepezil  (ARICEPT ) 10 MG tablet, Take 5 mg by mouth at bedtime. Take 0.5 tablet (5mg ) by mouth daily for 30 days., Disp: , Rfl:    furosemide  (LASIX ) 40 MG tablet, Take 1 tablet (40 mg total) by mouth 3 (three) times a week. Take one tablet (40mg ) by mouth three times weekly on Monday, Wednesday, and Friday., Disp: , Rfl:    hydrALAZINE  (APRESOLINE ) 25 MG tablet, Take 1 tablet (25 mg total) by mouth daily as needed (Hypertension). Take one tablet (25mg ) by mouth daily as needed if SBP is >160, Disp: , Rfl:    insulin  lispro (HUMALOG ) 100 UNIT/ML KwikPen, Inject 8 Units into the skin 3 (three) times daily before meals., Disp: , Rfl:    lamoTRIgine  (LAMICTAL ) 25 MG tablet, Take 25 mg by mouth daily., Disp: , Rfl:    LANTUS  SOLOSTAR 100 UNIT/ML Solostar Pen, Inject 36 Units into the skin daily before breakfast., Disp: , Rfl:    latanoprost (XALATAN) 0.005 % ophthalmic solution, Place 1 drop into both eyes at bedtime., Disp: , Rfl:    MYRBETRIQ  25 MG TB24 tablet, Take 25 mg by mouth at bedtime as needed (bladder control)., Disp: , Rfl:    ondansetron  (ZOFRAN -ODT) 4 MG disintegrating tablet,  Take 1 tablet (4 mg total) by mouth every 8 (eight) hours as needed., Disp: 20 tablet, Rfl: 0   pantoprazole  (PROTONIX ) 40 MG tablet, TAKE 1 TABLET BY MOUTH EVERY DAY, Disp: 90 tablet, Rfl: 2   polyethylene glycol powder (GLYCOLAX /MIRALAX ) 17 GM/SCOOP powder, Take 17 g by mouth as needed for mild constipation or moderate constipation., Disp: , Rfl:    QUEtiapine  (SEROQUEL ) 50 MG tablet, Take 50 mg by mouth at bedtime., Disp: , Rfl:    tamsulosin  (FLOMAX ) 0.4 MG CAPS capsule, Take 0.4 mg  by mouth daily., Disp: , Rfl:    tirzepatide (MOUNJARO) 2.5 MG/0.5ML Pen, Inject 2.5 mg into the skin once a week. Inject on Tuesday, Disp: , Rfl:    TRADJENTA  5 MG TABS tablet, Take 5 mg by mouth daily., Disp: , Rfl:   Vitals   Vitals:   07-28-24 2015 Jul 28, 2024 2030 07/28/2024 2045 07/28/2024 2100  BP: (!) 166/93 (!) 167/117 (!) 180/100 (!) 172/108  Pulse: 68 79 76 77  Resp: (!) 21 19 20  (!) 25  Temp:      TempSrc:      SpO2: 100% 100% 100% 100%    There is no height or weight on file to calculate BMI.   Physical Exam   General: Awake alert in some distress due to respiratory difficulty although he is saturating normally on room air at this time HEENT: Normocephalic atraumatic Lungs: Diminished breath sounds bilaterally but saturating normally on room air Abdomen nondistended nontender Neurological exam He is awake alert oriented x 3 He appears a little bit confused as she is not able to tell me the exact medications he has been on. He knows in the hospital, he is able to tell me his correct age but not the correct month. His speech is more dyspneic than dysarthric. No clear aphasia on my exam Cranial nerve examination: Pupils equal round react light, extraocular movements intact, visual fields full, no diplopia on looking at either side and no diplopia or ptosis on prolonged upward gaze, face appears symmetric, facial sensation intact, tongue and palate midline. Motor examination reveals antigravity  strength in all 4 extremities with generalized weakness of 4+/5 strength in all fours.  He has more fatigable weakness in the lower extremity than upper extremity.  Upper extremity is mildly fatigable.  His neck flexor and extensor strength is at best 4 out of 5. Single breath number test-only able to count to 5 in a single breath. Sensation intact to light touch Coordination examination reveals no dysmetria  Labs/Imaging/Neurodiagnostic studies   CBC:  Recent Labs  Lab Jul 28, 2024 1655 2024/07/28 1726  WBC 9.2  --   NEUTROABS 6.3  --   HGB 13.0 13.6  13.9  HCT 41.8 40.0  41.0  MCV 84.3  --   PLT 240  --    Basic Metabolic Panel:  Lab Results  Component Value Date   NA 139 07/28/2024   NA 139 2024-07-28   K 4.3 28-Jul-2024   K 4.2 2024-07-28   CO2 23 07-28-2024   GLUCOSE 315 (H) July 28, 2024   BUN 31 (H) 2024-07-28   CREATININE 2.20 (H) 07/28/24   CALCIUM  8.5 (L) 07-28-24   GFRNONAA 29 (L) 07/28/24   GFRAA 39 (L) 05/22/2020   Lipid Panel:  Lab Results  Component Value Date   LDLCALC 66 09/18/2022   HgbA1c:  Lab Results  Component Value Date   HGBA1C 10.8 (H) 02/10/2024   Urine Drug Screen:     Component Value Date/Time   LABOPIA NONE DETECTED 01/07/2023 2203   COCAINSCRNUR NONE DETECTED 01/07/2023 2203   LABBENZ NONE DETECTED 01/07/2023 2203   AMPHETMU NONE DETECTED 01/07/2023 2203   THCU NONE DETECTED 01/07/2023 2203   LABBARB NONE DETECTED 01/07/2023 2203    Alcohol  Level     Component Value Date/Time   ETH <10 01/07/2023 1638   INR  Lab Results  Component Value Date   INR 1.5 (H) 04/15/2023   APTT  Lab Results  Component Value Date   APTT 31 09/17/2022  Personally reviewed imaging: Chest x-ray with no acute findings CT head from 2024 with no acute findings    ASSESSMENT   Fernando Hess is a 78 y.o. 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  He is saturating well on room air at this time although initially required BiPAP. His examination does not really reveal the true fatigability or diplopia seen with myasthenia but his respiratory status definitely is different than what he has been seen during prior admissions where he is not able to count till more than 5 with single breath and his neck flexor and extensor is weak. This could very well be exacerbation of a neuromuscular disorder although a clear myasthenia diagnosis has not been made in my review of his chart. Because of his atrial fibrillation, brainstem pathology such as stroke should also be ruled out.  My recommendations are below.  My impression at this time is a broad differential of possible myasthenia exacerbation versus pseudo exacerbation of myasthenic symptoms in the setting of underlying infection such as UTI versus a brainstem pathology such as a brainstem infarct.   RECOMMENDATIONS  Admit for observation in the stepdown unit hospitalist for now Every 6 hours NIF/FVC Check urinalysis to rule out any evidence of acute infection MRI brain without contrast to rule out brainstem infarct or pathology contributing to dyspnea.  I will also recommend reaching out to the outpatient neurology team to get some input in the morning. If the MRI is negative and there is no evidence of infection on UTI, consider IVIG infusions. Continue to replenish B12 Plan was discussed with Dr. Melvenia in detail, in person and then again over the phone after review of chart and evaluation of the patient. Neurology will follow ______________________________________________________________________    Signed, Eligio Lav, MD [facility]

## 2024-06-28 NOTE — ED Notes (Signed)
PT aware a urine sample is needed.

## 2024-06-28 NOTE — Progress Notes (Signed)
 NIF 40 FVC 1.01  Pred %30 FEV1/FVC 0.94

## 2024-06-28 NOTE — Progress Notes (Signed)
 NIF 33 FVC 1.44  Pred%43 Fev1(L) 1.43 Pred% 61 FEV1/FVC 1.00

## 2024-06-28 NOTE — ED Notes (Signed)
 PT reminded a urine sample is needed.  PT aware we can straight cath if he decides.

## 2024-06-28 NOTE — Plan of Care (Signed)
 Patient declined urine sample.  He does not want to urinate and also declined In-N-Out cath.

## 2024-06-29 ENCOUNTER — Inpatient Hospital Stay (HOSPITAL_COMMUNITY)

## 2024-06-29 DIAGNOSIS — I779 Disorder of arteries and arterioles, unspecified: Secondary | ICD-10-CM

## 2024-06-29 DIAGNOSIS — I639 Cerebral infarction, unspecified: Secondary | ICD-10-CM

## 2024-06-29 DIAGNOSIS — I6329 Cerebral infarction due to unspecified occlusion or stenosis of other precerebral arteries: Principal | ICD-10-CM

## 2024-06-29 DIAGNOSIS — I6389 Other cerebral infarction: Secondary | ICD-10-CM | POA: Diagnosis not present

## 2024-06-29 DIAGNOSIS — I6529 Occlusion and stenosis of unspecified carotid artery: Secondary | ICD-10-CM

## 2024-06-29 DIAGNOSIS — I480 Paroxysmal atrial fibrillation: Secondary | ICD-10-CM | POA: Diagnosis not present

## 2024-06-29 DIAGNOSIS — R29701 NIHSS score 1: Secondary | ICD-10-CM

## 2024-06-29 DIAGNOSIS — N184 Chronic kidney disease, stage 4 (severe): Secondary | ICD-10-CM

## 2024-06-29 DIAGNOSIS — G7000 Myasthenia gravis without (acute) exacerbation: Secondary | ICD-10-CM

## 2024-06-29 DIAGNOSIS — E1151 Type 2 diabetes mellitus with diabetic peripheral angiopathy without gangrene: Secondary | ICD-10-CM

## 2024-06-29 DIAGNOSIS — E785 Hyperlipidemia, unspecified: Secondary | ICD-10-CM

## 2024-06-29 DIAGNOSIS — G4733 Obstructive sleep apnea (adult) (pediatric): Secondary | ICD-10-CM | POA: Diagnosis not present

## 2024-06-29 DIAGNOSIS — E119 Type 2 diabetes mellitus without complications: Secondary | ICD-10-CM | POA: Diagnosis not present

## 2024-06-29 LAB — COMPREHENSIVE METABOLIC PANEL WITH GFR
ALT: 10 U/L (ref 0–44)
AST: 13 U/L — ABNORMAL LOW (ref 15–41)
Albumin: 2.6 g/dL — ABNORMAL LOW (ref 3.5–5.0)
Alkaline Phosphatase: 88 U/L (ref 38–126)
Anion gap: 8 (ref 5–15)
BUN: 27 mg/dL — ABNORMAL HIGH (ref 8–23)
CO2: 23 mmol/L (ref 22–32)
Calcium: 7.6 mg/dL — ABNORMAL LOW (ref 8.9–10.3)
Chloride: 109 mmol/L (ref 98–111)
Creatinine, Ser: 1.92 mg/dL — ABNORMAL HIGH (ref 0.61–1.24)
GFR, Estimated: 35 mL/min — ABNORMAL LOW (ref >60)
Glucose, Bld: 209 mg/dL — ABNORMAL HIGH (ref 70–99)
Potassium: 3.4 mmol/L — ABNORMAL LOW (ref 3.5–5.1)
Sodium: 140 mmol/L (ref 135–145)
Total Bilirubin: 0.8 mg/dL (ref 0.0–1.2)
Total Protein: 5.4 g/dL — ABNORMAL LOW (ref 6.5–8.1)

## 2024-06-29 LAB — ECHOCARDIOGRAM COMPLETE
AR max vel: 1.8 cm2
AV Area VTI: 1.61 cm2
AV Area mean vel: 1.75 cm2
AV Mean grad: 7.5 mmHg
AV Peak grad: 14.3 mmHg
Ao pk vel: 1.89 m/s
Area-P 1/2: 3.59 cm2
Calc EF: 62 %
Height: 66 in
S' Lateral: 3.2 cm
Single Plane A2C EF: 46.2 %
Single Plane A4C EF: 68 %
Weight: 3040 [oz_av]

## 2024-06-29 LAB — URINALYSIS, ROUTINE W REFLEX MICROSCOPIC
Bacteria, UA: NONE SEEN
Bilirubin Urine: NEGATIVE
Glucose, UA: >=500 mg/dL — AB
Ketones, ur: NEGATIVE mg/dL
Nitrite: NEGATIVE
Protein, ur: 100 mg/dL — AB
Specific Gravity, Urine: 1.021 (ref 1.005–1.030)
pH: 5 (ref 5.0–8.0)

## 2024-06-29 LAB — HEMOGLOBIN A1C
Hgb A1c MFr Bld: 8.8 % — ABNORMAL HIGH (ref 4.8–5.6)
Mean Plasma Glucose: 205.86 mg/dL

## 2024-06-29 LAB — LIPID PANEL
Cholesterol: 171 mg/dL (ref 0–200)
HDL: 28 mg/dL — ABNORMAL LOW (ref >40)
LDL Cholesterol: 123 mg/dL — ABNORMAL HIGH (ref 0–99)
Total CHOL/HDL Ratio: 6.1 ratio
Triglycerides: 100 mg/dL (ref <150)
VLDL: 20 mg/dL (ref 0–40)

## 2024-06-29 LAB — CBC
HCT: 38.9 % — ABNORMAL LOW (ref 39.0–52.0)
Hemoglobin: 12.3 g/dL — ABNORMAL LOW (ref 13.0–17.0)
MCH: 26.6 pg (ref 26.0–34.0)
MCHC: 31.6 g/dL (ref 30.0–36.0)
MCV: 84.2 fL (ref 80.0–100.0)
Platelets: 213 K/uL (ref 150–400)
RBC: 4.62 MIL/uL (ref 4.22–5.81)
RDW: 15.3 % (ref 11.5–15.5)
WBC: 11.6 K/uL — ABNORMAL HIGH (ref 4.0–10.5)
nRBC: 0 % (ref 0.0–0.2)

## 2024-06-29 LAB — CBG MONITORING, ED
Glucose-Capillary: 203 mg/dL — ABNORMAL HIGH (ref 70–99)
Glucose-Capillary: 254 mg/dL — ABNORMAL HIGH (ref 70–99)

## 2024-06-29 LAB — GLUCOSE, CAPILLARY
Glucose-Capillary: 235 mg/dL — ABNORMAL HIGH (ref 70–99)
Glucose-Capillary: 190 mg/dL — ABNORMAL HIGH (ref 70–99)

## 2024-06-29 LAB — VITAMIN B12: Vitamin B-12: 2385 pg/mL — ABNORMAL HIGH (ref 180–914)

## 2024-06-29 MED ORDER — STROKE: EARLY STAGES OF RECOVERY BOOK
Freq: Once | Status: AC
  Filled 2024-06-29: qty 1

## 2024-06-29 MED ORDER — ACETAMINOPHEN 325 MG PO TABS: 650.0000 mg | ORAL_TABLET | ORAL | Status: DC | PRN

## 2024-06-29 MED ORDER — PERFLUTREN LIPID MICROSPHERE
1.0000 mL | INTRAVENOUS | Status: AC | PRN
  Administered 2024-06-29: 2 mL via INTRAVENOUS

## 2024-06-29 MED ORDER — AMLODIPINE BESYLATE 10 MG PO TABS
10.0000 mg | ORAL_TABLET | Freq: Every day | ORAL | Status: DC
  Administered 2024-06-29 – 2024-06-30 (×2): 10 mg via ORAL
  Filled 2024-06-29 (×2): qty 1

## 2024-06-29 MED ORDER — ACETAMINOPHEN 160 MG/5ML PO SOLN: 650.0000 mg | ORAL | Status: DC | PRN

## 2024-06-29 MED ORDER — SODIUM CHLORIDE 0.9 % IV SOLN: INTRAVENOUS | Status: DC

## 2024-06-29 MED ORDER — HYDRALAZINE HCL 25 MG PO TABS: 25.0000 mg | ORAL_TABLET | Freq: Three times a day (TID) | ORAL | Status: DC

## 2024-06-29 MED ORDER — HYDRALAZINE HCL 25 MG PO TABS
25.0000 mg | ORAL_TABLET | Freq: Three times a day (TID) | ORAL | Status: DC
  Administered 2024-06-29 – 2024-07-01 (×6): 25 mg via ORAL
  Filled 2024-06-29 (×6): qty 1

## 2024-06-29 MED ORDER — ASPIRIN 81 MG PO TBEC
81.0000 mg | DELAYED_RELEASE_TABLET | Freq: Every day | ORAL | Status: DC
  Administered 2024-06-29 – 2024-07-01 (×3): 81 mg via ORAL
  Filled 2024-06-29 (×3): qty 1

## 2024-06-29 MED ORDER — INSULIN ASPART 100 UNIT/ML IJ SOLN
4.0000 [IU] | Freq: Three times a day (TID) | INTRAMUSCULAR | Status: DC
  Administered 2024-06-29 – 2024-06-30 (×4): 4 [IU] via SUBCUTANEOUS

## 2024-06-29 MED ORDER — ORAL CARE MOUTH RINSE: 15.0000 mL | OROMUCOSAL | Status: DC | PRN

## 2024-06-29 MED ORDER — INSULIN ASPART 100 UNIT/ML IJ SOLN
0.0000 [IU] | Freq: Three times a day (TID) | INTRAMUSCULAR | Status: DC
  Administered 2024-06-29 – 2024-06-30 (×2): 5 [IU] via SUBCUTANEOUS
  Administered 2024-06-30: 11 [IU] via SUBCUTANEOUS
  Administered 2024-06-30 – 2024-07-01 (×2): 5 [IU] via SUBCUTANEOUS

## 2024-06-29 MED ORDER — APIXABAN 2.5 MG PO TABS
2.5000 mg | ORAL_TABLET | Freq: Two times a day (BID) | ORAL | Status: DC
  Administered 2024-06-29 – 2024-07-01 (×4): 2.5 mg via ORAL
  Filled 2024-06-29 (×4): qty 1

## 2024-06-29 MED ORDER — ACETAMINOPHEN 650 MG RE SUPP: 650.0000 mg | RECTAL | Status: DC | PRN

## 2024-06-29 MED ORDER — FUROSEMIDE 40 MG PO TABS
40.0000 mg | ORAL_TABLET | ORAL | Status: DC
  Administered 2024-06-29 – 2024-07-01 (×2): 40 mg via ORAL
  Filled 2024-06-29 (×2): qty 1

## 2024-06-29 MED ORDER — INSULIN GLARGINE-YFGN 100 UNIT/ML ~~LOC~~ SOLN
36.0000 [IU] | Freq: Every day | SUBCUTANEOUS | Status: DC
  Administered 2024-06-30: 36 [IU] via SUBCUTANEOUS
  Filled 2024-06-29 (×2): qty 0.36

## 2024-06-29 MED ORDER — QUETIAPINE FUMARATE 25 MG PO TABS
25.0000 mg | ORAL_TABLET | Freq: Every day | ORAL | Status: DC
  Administered 2024-06-29 – 2024-06-30 (×2): 25 mg via ORAL
  Filled 2024-06-29 (×2): qty 1

## 2024-06-29 MED ORDER — SENNOSIDES-DOCUSATE SODIUM 8.6-50 MG PO TABS: 1.0000 | ORAL_TABLET | Freq: Every evening | ORAL | Status: DC | PRN

## 2024-06-29 MED ORDER — INSULIN ASPART 100 UNIT/ML IJ SOLN
0.0000 [IU] | Freq: Every day | INTRAMUSCULAR | Status: DC
  Administered 2024-06-30: 3 [IU] via SUBCUTANEOUS

## 2024-06-29 NOTE — Evaluation (Signed)
 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 Lives at home with wife, in a 2-level home with 5-7 steps to enter; Prior to admission, pt was able to walk household distances with RW, and wife tells me he was trying to get to safely walking without an assistive device; Presents to PT with L sided weakness (more pronounced in LEs) in addition to generalized weakness, inefficient gait pattern, general fatigue; Overall moving well to get up to EOB, stands with CGA for safety, able to walk short hallway distance with RW and CGA; Stroke workup underway; Anticipate should be able to DC home with mostly supervision assist and HHPT follow up (and rec Outpt PT after [facility] course);  Pt currently with functional limitations due to the deficits listed below (see PT Problem List). Pt will benefit from skilled PT to increase their independence and safety with mobility to allow discharge to the venue listed below.           If plan is discharge home, recommend the following: A little help with walking and/or transfers;A little help with bathing/dressing/bathroom;Assistance with cooking/housework;Help with stairs or ramp for entrance   Can travel by private vehicle        Equipment Recommendations None recommended by PT (well-equipped)  Recommendations for Other Services        Functional Status Assessment Patient has had a recent decline in their functional status and demonstrates the ability to make significant improvements in function in a reasonable and predictable amount of time.     Precautions / Restrictions Precautions Precautions: Fall Recall of Precautions/Restrictions: Intact Restrictions Weight Bearing Restrictions Per Provider Order: No      Mobility  Bed Mobility Overal bed mobility: Needs Assistance Bed Mobility: Supine to Sit, Sit to Supine     Supine to sit: Supervision Sit to supine: Supervision   General bed mobility comments: Cues for getting in more comfortable position on stretcher    Transfers Overall transfer level: Needs assistance Equipment used: Rolling walker (2 wheels) Transfers: Sit to/from Stand Sit to Stand: Contact guard assist (no lifting assist needed)           General transfer comment: No difficulty standing from elevated surface of stretcher    Ambulation/Gait Ambulation/Gait assistance: Contact guard assist, Min assist Gait Distance (Feet): 35 Feet Assistive device: Rolling walker (2 wheels) Gait Pattern/deviations: Step-through pattern, Decreased step length - right, Decreased step length - left Gait velocity: slowed     General Gait Details: Slow, short steps; decr L foot clearance, but no overt toe catch L foot; fatigued at the end of walk; no reports of difficulty breathing  Stairs            Wheelchair Mobility     Tilt Bed    Modified Rankin (Stroke Patients Only)       Balance Overall balance assessment: Needs assistance   Sitting balance-Leahy Scale: Fair       Standing balance-Leahy Scale: Poor (approaching Fair)  Pertinent Vitals/Pain Pain Assessment Pain Assessment: No/denies pain    Home Living Family/patient expects to be discharged to:: Private residence Living Arrangements: Spouse/significant other Available  Help at Discharge: Family (Can porvide supervision, limited physical assist) Type of Home: House Home Access: Stairs to enter Entrance Stairs-Rails: Right Entrance Stairs-Number of Steps: 7 Alternate Level Stairs-Number of Steps:  Advertising account executive per patient) Home Layout: Two level;Able to live on main level with bedroom/bathroom Home Equipment: Rolling Walker (2 wheels);Shower seat;Grab bars - tub/shower;BSC/3in1;Wheelchair Museum/gallery curator (4 wheels)      Prior Function Prior Level of Function : Needs assist             Mobility Comments: uses Rollator ADLs Comments: Wife assists     Extremity/Trunk Assessment   Upper Extremity Assessment Upper Extremity Assessment: Defer to OT evaluation    Lower Extremity Assessment Lower Extremity Assessment: Generalized weakness;LLE deficits/detail (Able to power up to stand from edge of stretcher (which is high)) LLE Deficits / Details: Notably weaker than RLE in hip flexion, knee flexion/extension, and ankle dorsiflexion       Communication   Communication Communication: Impaired Factors Affecting Communication: Reduced clarity of speech    Cognition Arousal: Alert Behavior During Therapy: WFL for tasks assessed/performed   PT - Cognitive impairments: No apparent impairments, Difficult to assess Difficult to assess due to: Impaired communication (Difficult to understand at times, but he is willing to repeat as needed without issue)                     PT - Cognition Comments: Noteworthy that pt chose not to sit down in chair without armrests, citing anticipated difficulty standing up without armrests to push off Following commands: Intact       Cueing Cueing Techniques: Verbal cues, Gestural cues, Tactile cues     General Comments General comments (skin integrity, edema, etc.): O2 sats stayed 95-100%; Assisted pt with changing his brief (which was wet)    Exercises     Assessment/Plan    PT  Assessment Patient needs continued PT services  PT Problem List Decreased strength;Decreased range of motion;Decreased activity tolerance;Decreased balance;Decreased mobility;Decreased coordination;Decreased cognition;Decreased knowledge of use of DME;Decreased knowledge of precautions       PT Treatment Interventions DME instruction;Gait training;Stair training;Functional mobility training;Therapeutic activities;Therapeutic exercise;Balance training;Neuromuscular re-education;Cognitive remediation;Patient/family education;Manual techniques    PT Goals (Current goals can be found in the Care Plan section)  Acute Rehab PT Goals Patient Stated Goal: Hopes to get home soon PT Goal Formulation: With patient Time For Goal Achievement: 07/13/24 Potential to Achieve Goals: Good    Frequency Min 4X/week     Co-evaluation               AM-PAC PT 6 Clicks Mobility  Outcome Measure Help needed turning from your back to your side while in a flat bed without using bedrails?: None Help needed moving from lying on your back to sitting on the side of a flat bed without using bedrails?: None Help needed moving to and from a bed to a chair (including a wheelchair)?: A Little Help needed standing up from a chair using your arms (e.g., wheelchair or bedside chair)?: A Little Help needed to walk in hospital room?: A Little Help needed climbing 3-5 steps with a railing? : A Lot 6 Click Score: 19    End of Session Equipment Utilized During Treatment: Gait belt Activity Tolerance: Patient tolerated treatment well Patient left: with call bell/phone within reach;in bed (  on stretcher) Nurse Communication: Mobility status PT Visit Diagnosis: Unsteadiness on feet (R26.81);Other abnormalities of gait and mobility (R26.89);Hemiplegia and hemiparesis Hemiplegia - Right/Left: Left Hemiplegia - dominant/non-dominant: Non-dominant Hemiplegia - caused by: Cerebral infarction    Time: 0902-0940 PT Time  Calculation (min) (ACUTE ONLY): 38 min   Charges:   PT Evaluation $PT Eval Moderate Complexity: 1 Mod PT Treatments $Gait Training: 8-22 mins $Therapeutic Activity: 8-22 mins PT General Charges $$ ACUTE PT VISIT: 1 Visit         Silvano Currier, PT  Acute Rehabilitation Services Office [phone] Secure Chat welcomed   Silvano VEAR Currier 06/29/2024, 11:28 AM

## 2024-06-29 NOTE — ED Notes (Signed)
 PT at bedside.

## 2024-06-29 NOTE — ED Notes (Signed)
 Pt reported urinating in his brief, with urinal hanging on bed rail.

## 2024-06-29 NOTE — ED Notes (Signed)
 Transported to vascular.

## 2024-06-29 NOTE — Progress Notes (Signed)
 Patient's wife called. She expressed concerns that patient called her at roughly 7:10pm and stated he was having trouble speaking. Upon doing bedside handoff, patient did not report symptoms and stated he had no further needs. Upon doing his NIH assessment, patient showed no deficits.  She states he has been experiencing urinary urgency since his previous admission which has caused him to soil his clothing. She states he may be suffering from incontinence.

## 2024-06-29 NOTE — Plan of Care (Signed)
 MRI of the brain reveals subtle right paramedian pons area of restricted diffusion concerning for stroke. Will need full stroke workup to include head and neck vessel imaging-MRA head without contrast, carotid Dopplers, also will need 2D echo, telemetry, therapy assessments. Hold Eliquis  for now Aspirin  81 for now Stroke team will follow Plan relayed to Dr. Lee, admitting hospitalist

## 2024-06-29 NOTE — Progress Notes (Signed)
 Carotid duplex  has been completed. Refer to [facility] under chart review to view preliminary results.   06/29/2024  10:58 AM Annalynne Ibanez, Ricka BIRCH

## 2024-06-29 NOTE — Progress Notes (Signed)
 PROGRESS NOTE  Fernando Hess FMW:[id number] DOB: 10-May-1946   PCP: Okey Carlin Redbird, MD  Patient is from: Home.  Lives his wife.  Uses rolling walker at baseline.  DOA: 06/28/2024 LOS: 1  Chief complaints Chief Complaint  Patient presents with   Respiratory Distress   Shortness of Breath     Brief Narrative / Interim history: 78 year old M with PMH of major cognitive impairment, HFpEF, DM-2, CAD, paroxysmal A-fib on Eliquis , CKD-3B, OSA not on CPAP, anxiety, depression, seronegative myasthenia gravis and idiopathic progressive neuropathy followed by neurology outpatient presenting with shortness of breath and generalized weakness starting the morning of presentation. In ED, SBP elevated to 200s.  Initially received Nitropaste and started on nitro drip.  There was some concern about myasthenia gravis exacerbation and neurology was consulted.  However, MRI brain showed acute subtotal right paramedian pontine infarct, and complete CVA workup initiated per recommendation by neurology.   MRA head with moderate to severe stenosis of petrous right ICA and diffuse irregularity and stenosis of bilateral vertebral arteries.  Carotid Doppler with 1 to 39% stenosis in right ICA and 50 to 59% stenosis in left ICA.  TTE without significant finding.  LDL 123.  A1c 8.8%.   Subjective: Seen and examined earlier this afternoon.  No major events overnight or this morning.  No complaints.  Shortness of breath has resolved.  Denies chest pain, palpitation, headache, vision change, focal weakness, numbness or tingling.  Denies GI or UTI symptoms.   Objective: Vitals:   06/29/24 1300 06/29/24 1330 06/29/24 1345 06/29/24 1414  BP: (!) 179/69 (!) 173/97 (!) 180/74   Pulse: 93 95 82   Resp:   18   Temp:    98.5 F (36.9 C)  TempSrc:    Oral  SpO2: 100% 100% 100%   Weight:    84.9 kg  Height:    5' 6 (1.676 m)    Examination:  GENERAL: No apparent distress.  Nontoxic. HEENT: MMM.  Vision and  hearing grossly intact.  NECK: Supple.  No apparent JVD.  RESP:  No IWOB.  Fair aeration bilaterally. CVS:  RRR. Heart sounds normal.  ABD/GI/GU: BS+. Abd soft, NTND.  MSK/EXT:  Moves extremities. No apparent deformity. No edema.  SKIN: no apparent skin lesion or wound NEURO: Awake and alert.  Oriented x 4. Speech clear. Cranial nerves II-XII intact. Motor intact and symmetric in all extremities.  Light sensation intact in all dermatomes. Patellar reflex symmetric.  No pronator drift.  Finger to nose intact.  No nystagmus. PSYCH: Calm. Normal affect.   Consultants:  Neurology  Procedures: None  Microbiology summarized: None  Assessment and plan: Shortness of breath/respiratory distress: No desaturation but initially started on BiPAP.  Serial troponin, BNP and CXR unrevealing.  SBP was elevated to 200s.  Initially started on nitro drip that was later discontinued due to stroke.  Has history of OSA.  VBG reassuring.  History of possible seronegative myasthenia gravis and idiopathic progressive neuropathy.  NIF 30. -Treat hypertension and CAD as below - IS  Acute ischemic paramedian pontine CVA: Bleed due to small vessel disease.  However, he carries history of CVA.  SBP elevated to 200s.  CVA workup as above. - Continue aspirin  per neurology - Neurology recommended resuming Eliquis  once BP controlled - Therapy recommended home health. - May need statin if not contraindicated with seronegative myasthenia gravis.  Hypertensive urgency: BP elevated but improved.  Gradually normalize BP - Continue amlodipine  10 mg daily - Resume home  hydralazine  25 mg 3 times daily - IV hydralazine  as needed  History of possible seronegative myasthenia gravis?  Followed by neurology outpatient.  Previously failed treatment by Mestinon and IVIG.  Supposed to be followed up with neuromuscular speciality.  Unclear if he pursued this. -Outpatient follow-up  Idiopathic progressive neuropathy: On vitamin  B12 outpatient.  Vitamin B12 level elevated. -Currently holding vitamin B12. - Followed by neurology outpatient - Outpatient follow-up with primary neurologist  IDDM-2 with hyperglycemia: A1c 8.8%. Recent Labs  Lab 06/28/24 2349 06/29/24 0742 06/29/24 1215  GLUCAP 276* 203* 254*  - Continue Lantus  36 units daily -Increase SSI to moderate -Add NovoLog  4 units 3 times daily with meals -Further adjustment as appropriate  Paroxysmal A-fib/RBBB/LAFB: Rate controlled irregular rhythm but not sinus.  On Eliquis  for anticoagulation. - Will reduce Eliquis  to 2.5 mg twice daily given his age and renal function - Optimize electrolytes.  Vitamin B12 deficiency: B12 elevated to 2000's. -Hold vitamin B12   Obstructive sleep apnea not on CPAP.  VBG reassuring.   History of anxiety and depression stable -Continue Seroquel  and Lamictal    History of CAD: No chest pain.  Serial troponin negative.  CT without significant finding -Antiplatelet as above   CKD stage IV: Baseline Cr ranges from 1.7-2.3.  Stable. -Avoid nephrotoxic meds  Dementia?  Awake and alert and oriented x 4. -Continue home Aricept . -Reorientation and delirium precaution   BPH -Continue Flomax   History of glucoma -Continue latanoprost  Class I obesity Body mass index is 30.21 kg/m.          DVT prophylaxis:  apixaban  (ELIQUIS ) tablet 2.5 mg Start: 06/29/24 2200 SCD's Start: 06/29/24 0245 SCDs Start: 06/28/24 2253 apixaban  (ELIQUIS ) tablet 2.5 mg  Code Status: Full code Family Communication: None at bedside. Level of care: Progressive Status is: Inpatient Remains inpatient appropriate because: Acute CVA, uncontrolled hypertension   Final disposition: Likely home   55 minutes with more than 50% spent in reviewing records, counseling patient/family and coordinating care.   Sch Meds:  Scheduled Meds:  [START ON 06/30/2024]  stroke: early stages of recovery book   Does not apply Once   amLODipine   10  mg Oral Daily   apixaban   2.5 mg Oral BID   aspirin  EC  81 mg Oral Daily   donepezil   10 mg Oral QHS   hydrALAZINE   25 mg Oral Q8H   insulin  aspart  0-15 Units Subcutaneous TID WC   insulin  aspart  0-5 Units Subcutaneous QHS   insulin  aspart  4 Units Subcutaneous TID WC   insulin  glargine  36 Units Subcutaneous Daily   lamoTRIgine   25 mg Oral Daily   latanoprost  1 drop Both Eyes QHS   pantoprazole   40 mg Oral Daily   QUEtiapine   50 mg Oral QHS   sodium chloride  flush  3 mL Intravenous Q12H   tamsulosin   0.4 mg Oral Daily   Continuous Infusions:  sodium chloride      PRN Meds:.sodium chloride , acetaminophen  **OR** acetaminophen , hydrALAZINE , mouth rinse, senna-docusate, sodium chloride  flush  Antimicrobials: Anti-infectives (From admission, onward)    None        I have personally reviewed the following labs and images: CBC: Recent Labs  Lab 06/28/24 1655 06/28/24 1726 06/29/24 0623  WBC 9.2  --  11.6*  NEUTROABS 6.3  --   --   HGB 13.0 13.6  13.9 12.3*  HCT 41.8 40.0  41.0 38.9*  MCV 84.3  --  84.2  PLT 240  --  213   BMP &GFR Recent Labs  Lab 06/28/24 1655 06/28/24 1726 06/29/24 0623  NA 137 139  139 140  K 4.2 4.2  4.3 3.4*  CL 104 105 109  CO2 23  --  23  GLUCOSE 312* 315* 209*  BUN 29* 31* 27*  CREATININE 2.24* 2.20* 1.92*  CALCIUM  8.5*  --  7.6*  MG 2.0  --   --    Estimated Creatinine Clearance: 31.3 mL/min (A) (by C-G formula based on SCr of 1.92 mg/dL (H)). Liver & Pancreas: Recent Labs  Lab 06/28/24 1655 06/29/24 0623  AST 17 13*  ALT 11 10  ALKPHOS 107 88  BILITOT 0.8 0.8  PROT 6.3* 5.4*  ALBUMIN  3.0* 2.6*   No results for input(s): LIPASE, AMYLASE in the last 168 hours. No results for input(s): AMMONIA in the last 168 hours. Diabetic: Recent Labs    06/29/24 0836  HGBA1C 8.8*   Recent Labs  Lab 06/28/24 2349 06/29/24 0742 06/29/24 1215  GLUCAP 276* 203* 254*   Cardiac Enzymes: No results for input(s):  CKTOTAL, CKMB, CKMBINDEX, TROPONINI in the last 168 hours. No results for input(s): PROBNP in the last 8760 hours. Coagulation Profile: No results for input(s): INR, PROTIME in the last 168 hours. Thyroid  Function Tests: No results for input(s): TSH, T4TOTAL, FREET4, T3FREE, THYROIDAB in the last 72 hours. Lipid Profile: Recent Labs    06/29/24 0500  CHOL 171  HDL 28*  LDLCALC 123*  TRIG 100  CHOLHDL 6.1   Anemia Panel: Recent Labs    06/29/24 0836  VITAMINB12 2,385*   Urine analysis:    Component Value Date/Time   COLORURINE YELLOW 06/29/2024 1330   APPEARANCEUR CLEAR 06/29/2024 1330   LABSPEC 1.021 06/29/2024 1330   PHURINE 5.0 06/29/2024 1330   GLUCOSEU >=500 (A) 06/29/2024 1330   HGBUR SMALL (A) 06/29/2024 1330   BILIRUBINUR NEGATIVE 06/29/2024 1330   KETONESUR NEGATIVE 06/29/2024 1330   PROTEINUR 100 (A) 06/29/2024 1330   UROBILINOGEN 0.2 10/02/2009 1604   NITRITE NEGATIVE 06/29/2024 1330   LEUKOCYTESUR TRACE (A) 06/29/2024 1330   Sepsis Labs: Invalid input(s): PROCALCITONIN, LACTICIDVEN  Microbiology: Recent Results (from the past 240 hours)  Resp panel by RT-PCR (RSV, Flu A&B, Covid) Anterior Nasal Swab     Status: None   Collection Time: 06/28/24  4:55 PM   Specimen: Anterior Nasal Swab  Result Value Ref Range Status   SARS Coronavirus 2 by RT PCR NEGATIVE NEGATIVE Final   Influenza A by PCR NEGATIVE NEGATIVE Final   Influenza B by PCR NEGATIVE NEGATIVE Final    Comment: (NOTE) The Xpert Xpress SARS-CoV-2/FLU/RSV plus assay is intended as an aid in the diagnosis of influenza from Nasopharyngeal swab specimens and should not be used as a sole basis for treatment. Nasal washings and aspirates are unacceptable for Xpert Xpress SARS-CoV-2/FLU/RSV testing.  Fact Sheet for Patients: BloggerCourse.com  Fact Sheet for Healthcare Providers: SeriousBroker.it  This test is not yet  approved or cleared by the [place]  FDA and has been authorized for detection and/or diagnosis of SARS-CoV-2 by FDA under an Emergency Use Authorization (EUA). This EUA will remain in effect (meaning this test can be used) for the duration of the COVID-19 declaration under Section 564(b)(1) of the Act, 21 U.S.C. section 360bbb-3(b)(1), unless the authorization is terminated or revoked.     Resp Syncytial Virus by PCR NEGATIVE NEGATIVE Final    Comment: (NOTE) Fact Sheet for Patients: BloggerCourse.com  Fact Sheet for Healthcare Providers: SeriousBroker.it  This test is not yet approved or cleared by the [place]  FDA and has been authorized for detection and/or diagnosis of SARS-CoV-2 by FDA under an Emergency Use Authorization (EUA). This EUA will remain in effect (meaning this test can be used) for the duration of the COVID-19 declaration under Section 564(b)(1) of the Act, 21 U.S.C. section 360bbb-3(b)(1), unless the authorization is terminated or revoked.  Performed at [facility], [address] [place], [name] [address]     Radiology Studies: ECHOCARDIOGRAM COMPLETE Result Date: 06/29/2024    ECHOCARDIOGRAM REPORT   Patient Name:   Fernando Hess Date of Exam: 06/29/2024 Medical Rec #:  [id number]      Height:       66.0 in Accession #:    [id number]     Weight:       190.0 lb Date of Birth:  07/20/1946      BSA:          1.957 m Patient Age:    78 years       BP:           169/85 mmHg Patient Gender: M              HR:           70 bpm. Exam Location:  Inpatient Procedure: 2D Echo, Cardiac Doppler, Color Doppler and Intracardiac            Opacification Agent (Both Spectral and Color Flow Doppler were            utilized during procedure). Indications:    Stroke  History:        Patient has prior history of Echocardiogram examinations, most                 recent 02/10/2024. CAD, Abnormal ECG and Prior CABG,  Aortic Valve                 Disease, Arrythmias:Atrial Fibrillation, Signs/Symptoms:Dyspnea,                 Shortness of Breath, Altered Mental Status, Alzheimer's and                 Syncope; Risk Factors:Sleep Apnea.  Sonographer:    Ellouise Mose RDCS Referring Phys: MICAELA SPEAKER  Sonographer Comments: Technically difficult study due to poor echo windows, suboptimal apical window and suboptimal subcostal window. IMPRESSIONS  1. Left ventricular ejection fraction, by estimation, is 70 to 75%. The left ventricle has hyperdynamic function. The left ventricle has no regional wall motion abnormalities. There is mild left ventricular hypertrophy of the basal-septal segment. Left ventricular diastolic parameters are consistent with Grade I diastolic dysfunction (impaired relaxation).  2. Right ventricular systolic function is normal. The right ventricular size is normal. Tricuspid regurgitation signal is inadequate for assessing PA pressure.  3. The mitral valve is normal in structure. Trivial mitral valve regurgitation. No evidence of mitral stenosis.  4. The aortic valve is calcified. Aortic valve regurgitation is mild. Mild aortic valve stenosis.  5. The inferior vena cava is normal in size with greater than 50% respiratory variability, suggesting right atrial pressure of 3 mmHg. FINDINGS  Left Ventricle: Left ventricular ejection fraction, by estimation, is 70 to 75%. The left ventricle has hyperdynamic function. The left ventricle has no regional wall motion abnormalities. Definity  contrast agent was given IV to delineate the left ventricular endocardial borders. The left ventricular internal cavity size was normal in size. There is  mild left ventricular hypertrophy of the basal-septal segment. Left ventricular diastolic parameters are consistent with Grade I diastolic dysfunction  (impaired relaxation). Right Ventricle: The right ventricular size is normal. Right ventricular systolic function is normal. Tricuspid  regurgitation signal is inadequate for assessing PA pressure. Left Atrium: Left atrial size was normal in size. Right Atrium: Right atrial size was normal in size. Pericardium: There is no evidence of pericardial effusion. Mitral Valve: The mitral valve is normal in structure. Mild mitral annular calcification. Trivial mitral valve regurgitation. No evidence of mitral valve stenosis. Tricuspid Valve: The tricuspid valve is normal in structure. Tricuspid valve regurgitation is trivial. No evidence of tricuspid stenosis. Aortic Valve: The aortic valve is calcified. Aortic valve regurgitation is mild. Mild aortic stenosis is present. Aortic valve mean gradient measures 7.5 mmHg. Aortic valve peak gradient measures 14.3 mmHg. Aortic valve area, by VTI measures 1.61 cm. Pulmonic Valve: The pulmonic valve was normal in structure. Pulmonic valve regurgitation is not visualized. No evidence of pulmonic stenosis. Aorta: The aortic root is normal in size and structure. Venous: The inferior vena cava is normal in size with greater than 50% respiratory variability, suggesting right atrial pressure of 3 mmHg. IAS/Shunts: No atrial level shunt detected by color flow Doppler.  LEFT VENTRICLE PLAX 2D LVIDd:         4.50 cm     Diastology LVIDs:         3.20 cm     LV e' medial:    5.33 cm/s LV PW:         1.10 cm     LV E/e' medial:  13.9 LV IVS:        1.30 cm     LV e' lateral:   7.83 cm/s LVOT diam:     2.35 cm     LV E/e' lateral: 9.4 LV SV:         55 LV SV Index:   28 LVOT Area:     4.34 cm  LV Volumes (MOD) LV vol d, MOD A2C: 56.9 ml LV vol d, MOD A4C: 80.6 ml LV vol s, MOD A2C: 30.6 ml LV vol s, MOD A4C: 25.8 ml LV SV MOD A2C:     26.3 ml LV SV MOD A4C:     80.6 ml LV SV MOD BP:      46.5 ml RIGHT VENTRICLE            IVC RV S prime:     5.44 cm/s  IVC diam: 1.60 cm TAPSE (M-mode): 1.1 cm LEFT ATRIUM             Index        RIGHT ATRIUM           Index LA diam:        4.00 cm 2.04 cm/m   RA Area:     11.10 cm LA Vol  (A2C):   52.5 ml 26.83 ml/m  RA Volume:   19.90 ml  10.17 ml/m LA Vol (A4C):   45.7 ml 23.36 ml/m LA Biplane Vol: 49.3 ml 25.20 ml/m  AORTIC VALVE AV Area (Vmax):    1.80 cm AV Area (Vmean):   1.75 cm AV Area (VTI):     1.61 cm AV Vmax:           189.25 cm/s AV Vmean:          124.500 cm/s AV VTI:            0.340  m AV Peak Grad:      14.3 mmHg AV Mean Grad:      7.5 mmHg LVOT Vmax:         78.40 cm/s LVOT Vmean:        50.100 cm/s LVOT VTI:          0.126 m LVOT/AV VTI ratio: 0.37  AORTA Ao Root diam: 3.80 cm Ao Asc diam:  3.80 cm MITRAL VALVE MV Area (PHT): 3.59 cm     SHUNTS MV Decel Time: 212 msec     Systemic VTI:  0.13 m MV E velocity: 73.90 cm/s   Systemic Diam: 2.35 cm MV A velocity: 109.00 cm/s MV E/A ratio:  0.68 Redell Shallow MD Electronically signed by Redell Shallow MD Signature Date/Time: 06/29/2024/2:07:58 PM    Final    VAS US  CAROTID Result Date: 06/29/2024 Carotid Arterial Duplex Study Patient Name:  Fernando Hess Bunt  Date of Exam:   06/29/2024 Medical Rec #: [id number]       Accession #:    [id number] Date of Birth: 28-Sep-1945       Patient Gender: M Patient Age:   77 years Exam Location:  [facility] Procedure:      VAS US  CAROTID Referring Phys: ELIGIO LAV --------------------------------------------------------------------------------  Indications:      CVA and Carotid artery disease. Risk Factors:     Hypertension, hyperlipidemia, Diabetes, coronary artery                   disease. Comparison Study: 06/21/20 - bilateral 1-39% stenosis ICAs Performing Technologist: Ricka Sturdivant-Jones RDMS, RVT  Examination Guidelines: A complete evaluation includes B-mode imaging, spectral Doppler, color Doppler, and power Doppler as needed of all accessible portions of each vessel. Bilateral testing is considered an integral part of a complete examination. Limited examinations for reoccurring indications may be performed as noted.  Right Carotid Findings:  +----------+--------+--------+--------+------------------+------------------+           PSV cm/sEDV cm/sStenosisPlaque DescriptionComments           +----------+--------+--------+--------+------------------+------------------+ CCA Prox  62      15                                intimal thickening +----------+--------+--------+--------+------------------+------------------+ CCA Mid                                             intimal thickening +----------+--------+--------+--------+------------------+------------------+ CCA Distal57      15                                                   +----------+--------+--------+--------+------------------+------------------+ ICA Prox  57      19      1-39%   heterogenous                         +----------+--------+--------+--------+------------------+------------------+ ICA Distal55      21                                                   +----------+--------+--------+--------+------------------+------------------+  ECA       187     21                                                   +----------+--------+--------+--------+------------------+------------------+ +----------+--------+-------+----------------+-------------------+           PSV cm/sEDV cmsDescribe        Arm Pressure (mmHG) +----------+--------+-------+----------------+-------------------+ Subclavian210            Multiphasic, WNL                    +----------+--------+-------+----------------+-------------------+ +---------+--------+--+--------+--+---------+ VertebralPSV cm/s84EDV cm/s24Antegrade +---------+--------+--+--------+--+---------+  Left Carotid Findings: +----------+--------+--------+--------+------------------+---------------------+           PSV cm/sEDV cm/sStenosisPlaque DescriptionComments              +----------+--------+--------+--------+------------------+---------------------+ CCA Prox  83      18                                                       +----------+--------+--------+--------+------------------+---------------------+ CCA Mid                                             intimal thickening    +----------+--------+--------+--------+------------------+---------------------+ CCA Distal61      15                                intimal thickening    +----------+--------+--------+--------+------------------+---------------------+ ICA Prox  58      19      1-39%   heterogenous                            +----------+--------+--------+--------+------------------+---------------------+ ICA Mid   119     42      40-59%                    Velocities suggest                                                        lower end of scale    +----------+--------+--------+--------+------------------+---------------------+ ICA Distal41      12                                tortuous              +----------+--------+--------+--------+------------------+---------------------+ ECA       171     24                                                      +----------+--------+--------+--------+------------------+---------------------+ +----------+--------+--------+----------------+-------------------+  PSV cm/sEDV cm/sDescribe        Arm Pressure (mmHG) +----------+--------+--------+----------------+-------------------+ [id number]             Multiphasic, WNL                    +----------+--------+--------+----------------+-------------------+ +---------+--------+--+--------+--+---------+ VertebralPSV cm/s56EDV cm/s15Antegrade +---------+--------+--+--------+--+---------+   Summary: Right Carotid: Velocities in the right ICA are consistent with a 1-39% stenosis. Left Carotid: Velocities in the left ICA are consistent with a 40-59% stenosis.               Velocities closer to 40% stenosis. Vertebrals:  Bilateral vertebral arteries demonstrate antegrade flow.  Subclavians: Normal flow hemodynamics were seen in bilateral subclavian              arteries. *See table(s) above for measurements and observations.     Preliminary    MR ANGIO HEAD WO CONTRAST Result Date: 06/29/2024 EXAM: MR Angiography Head without intravenous Contrast. 06/29/2024 05:07:25 AM TECHNIQUE: Magnetic resonance angiography images of the head without intravenous contrast. Multiplanar 2D and 3D reformatted images are provided for review. COMPARISON: MRI of the head dated 06/29/2024. CLINICAL HISTORY: Acute CVA. FINDINGS: ANTERIOR CIRCULATION: Moderate-to-severe stenosis of the horizontal petrous segment of the right internal carotid artery. Mild-to-moderate stenosis of the ophthalmic segments of the internal carotid arteries bilaterally. The right A1 segment is relatively hypoplastic. The anterior and middle cerebral arteries demonstrate no apparent hemodynamically significant stenosis. No aneurysm. POSTERIOR CIRCULATION: Fetal type origins of the posterior cerebral arteries. No significant stenosis of the posterior cerebral arteries. The basilar artery is somewhat hypoplastic secondary to the fetal type origins of the posterior cerebral arteries. It is also irregular in caliber with multifocal mild-to-moderate stenosis. There is diffuse irregularity and stenosis of the vertebral arteries bilaterally. The left vertebral artery is hypoplastic and appears to terminate in the posterior inferior cerebellar artery. No aneurysm. IMPRESSION: 1. Moderate-to-severe stenosis of the horizontal petrous segment of the right internal carotid artery. 2. Mild-to-moderate stenosis of the ophthalmic segments of the internal carotid arteries bilaterally. 3. Diffuse irregularity and stenosis of the vertebral arteries bilaterally, with the left vertebral artery hypoplastic and terminating in the posterior inferior cerebellar artery. 4. 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 Electronically signed by: Evalene Coho MD 06/29/2024 05:18 AM EDT RP Workstation: [id number]   MR BRAIN WO CONTRAST Result Date: 06/29/2024 EXAM: MRI BRAIN WITHOUT CONTRAST 06/29/2024 12:57:00 AM TECHNIQUE: Multiplanar multisequence MRI of the head/brain was performed without the administration of intravenous contrast. COMPARISON: Comparison made with prior CT from 07/15/2023. CLINICAL HISTORY: FINDINGS: BRAIN AND VENTRICLES: Examination degraded by motion artifact. Subtle small focus of restricted diffusion seen involving the right paramedian midbrain, suspicious for a tiny acute ischemic nonhemorrhagic infarct (series 5, image [id number]). No associated hemorrhage or mass effect. Mild age related cerebral atrophy. Patchy T2 x FLAIR hyperintensities involving the periventricular and deep white matter of the cerebral hemispheres, consistent with chronic small vessel ischemic disease, mild to moderate in nature. Few small remote right cerebellar infarcts. Chronic lacunar infarct present at the right basal ganglia. No intracranial hemorrhage. No mass. No midline shift. No hydrocephalus. The sella is unremarkable. Normal flow voids. ORBITS: Prior bilateral ocular lens replacement. SINUSES AND MASTOIDS: Scattered mucosal thickening present about the ethmoid air cells and maxillary sinuses. BONES AND SOFT TISSUES: Normal marrow signal. No acute soft tissue abnormality. IMPRESSION: 1. Subtle small focus of restricted diffusion involving the right paramedian midbrain, suspicious for  a small acute ischemic nonhemorrhagic infarct. 2. Underlying age-related atrophy with mild to moderate chronic microvascular ischemic disease. Small remote lacunar infarcts at the right basal ganglia and right cerebellum. Electronically signed by: Morene Hoard MD 06/29/2024 01:43 AM EDT RP Workstation: [id number]   DG Chest Port 1 View Result Date: 06/28/2024 EXAM: 1 VIEW XRAY  OF THE CHEST 06/28/2024 04:47:00 PM COMPARISON: Comparison 06/03/2024 status post coronary artery bypass graft. CLINICAL HISTORY: Dyspnea. FINDINGS: LUNGS AND PLEURA: No focal pulmonary opacity. No pulmonary edema. No pleural effusion. No pneumothorax. HEART AND MEDIASTINUM: No acute abnormality of the cardiac and mediastinal silhouettes. BONES AND SOFT TISSUES: No acute osseous abnormality. IMPRESSION: 1. No acute cardiopulmonary process detected. Electronically signed by: Lynwood Seip MD 06/28/2024 04:56 PM EDT RP Workstation: [id number]      Kemora Pinard T. Lynkin Saini [facility]  If 7PM-7AM, please contact night-coverage [email or web address] 06/29/2024, 3:30 PM

## 2024-06-29 NOTE — Progress Notes (Addendum)
 STROKE TEAM PROGRESS NOTE   INTERIM HISTORY/SUBJECTIVE  Patient remains hemodynamically stable on the high end.  He reports that his shortness of breath has improved greatly since arrival in the ED.  OBJECTIVE  CBC    Component Value Date/Time   WBC 11.6 (H) 06/29/2024 0623   RBC 4.62 06/29/2024 0623   HGB 12.3 (L) 06/29/2024 0623   HGB 15.8 05/31/2021 1438   HCT 38.9 (L) 06/29/2024 0623   HCT 45.2 05/31/2021 1438   PLT 213 06/29/2024 0623   PLT 251 05/31/2021 1438   MCV 84.2 06/29/2024 0623   MCV 87 05/31/2021 1438   MCH 26.6 06/29/2024 0623   MCHC 31.6 06/29/2024 0623   RDW 15.3 06/29/2024 0623   RDW 13.2 05/31/2021 1438   LYMPHSABS 1.6 06/28/2024 1655   MONOABS 1.0 06/28/2024 1655   EOSABS 0.2 06/28/2024 1655   BASOSABS 0.1 06/28/2024 1655    BMET    Component Value Date/Time   NA 140 06/29/2024 0623   NA 138 11/06/2021 1031   K 3.4 (L) 06/29/2024 0623   CL 109 06/29/2024 0623   CO2 23 06/29/2024 0623   GLUCOSE 209 (H) 06/29/2024 0623   BUN 27 (H) 06/29/2024 0623   BUN 34 (H) 11/06/2021 1031   CREATININE 1.92 (H) 06/29/2024 0623   CALCIUM  7.6 (L) 06/29/2024 0623   EGFR 34 (L) 11/06/2021 1031   GFRNONAA 35 (L) 06/29/2024 0623    IMAGING past 24 hours VAS US  CAROTID Result Date: 06/29/2024 Carotid Arterial Duplex Study Patient Name:  Fernando Hess  Date of Exam:   06/29/2024 Medical Rec #: [id number]       Accession #:    [id number] Date of Birth: Fernando Hess       Patient Gender: M Patient Age:   78 years Exam Location:  [facility] Procedure:      VAS US  CAROTID Referring Phys: Fernando Hess --------------------------------------------------------------------------------  Indications:      CVA and Carotid artery disease. Risk Factors:     Hypertension, hyperlipidemia, Diabetes, coronary artery                   disease. Comparison Study: 06/21/20 - bilateral 1-39% stenosis ICAs Performing Technologist: Fernando Hess RDMS, RVT  Examination Guidelines:  A complete evaluation includes B-mode imaging, spectral Doppler, color Doppler, and power Doppler as needed of all accessible portions of each vessel. Bilateral testing is considered an integral part of a complete examination. Limited examinations for reoccurring indications Fernando be performed as noted.  Right Carotid Findings: +----------+--------+--------+--------+------------------+------------------+           PSV cm/sEDV cm/sStenosisPlaque DescriptionComments           +----------+--------+--------+--------+------------------+------------------+ CCA Prox  62      15                                intimal thickening +----------+--------+--------+--------+------------------+------------------+ CCA Mid                                             intimal thickening +----------+--------+--------+--------+------------------+------------------+ CCA Distal57      15                                                   +----------+--------+--------+--------+------------------+------------------+  ICA Prox  57      19      1-39%   heterogenous                         +----------+--------+--------+--------+------------------+------------------+ ICA Distal55      21                                                   +----------+--------+--------+--------+------------------+------------------+ ECA       187     21                                                   +----------+--------+--------+--------+------------------+------------------+ +----------+--------+-------+----------------+-------------------+           PSV cm/sEDV cmsDescribe        Arm Pressure (mmHG) +----------+--------+-------+----------------+-------------------+ Subclavian210            Multiphasic, WNL                    +----------+--------+-------+----------------+-------------------+ +---------+--------+--+--------+--+---------+ VertebralPSV cm/s84EDV cm/s24Antegrade  +---------+--------+--+--------+--+---------+  Left Carotid Findings: +----------+--------+--------+--------+------------------+---------------------+           PSV cm/sEDV cm/sStenosisPlaque DescriptionComments              +----------+--------+--------+--------+------------------+---------------------+ CCA Prox  83      18                                                      +----------+--------+--------+--------+------------------+---------------------+ CCA Mid                                             intimal thickening    +----------+--------+--------+--------+------------------+---------------------+ CCA Distal61      15                                intimal thickening    +----------+--------+--------+--------+------------------+---------------------+ ICA Prox  58      19      1-39%   heterogenous                            +----------+--------+--------+--------+------------------+---------------------+ ICA Mid   119     42      40-59%                    Velocities suggest                                                        lower end of scale    +----------+--------+--------+--------+------------------+---------------------+ ICA Distal41      12  tortuous              +----------+--------+--------+--------+------------------+---------------------+ ECA       171     24                                                      +----------+--------+--------+--------+------------------+---------------------+ +----------+--------+--------+----------------+-------------------+           PSV cm/sEDV cm/sDescribe        Arm Pressure (mmHG) +----------+--------+--------+----------------+-------------------+ [id number]             Multiphasic, WNL                    +----------+--------+--------+----------------+-------------------+ +---------+--------+--+--------+--+---------+ VertebralPSV cm/s56EDV  cm/s15Antegrade +---------+--------+--+--------+--+---------+   Summary: Right Carotid: Velocities in the right ICA are consistent with a 1-39% stenosis. Left Carotid: Velocities in the left ICA are consistent with a 40-59% stenosis.               Velocities closer to 40% stenosis. Vertebrals:  Bilateral vertebral arteries demonstrate antegrade flow. Subclavians: Normal flow hemodynamics were seen in bilateral subclavian              arteries. *See table(s) above for measurements and observations.     Preliminary    MR ANGIO HEAD WO CONTRAST Result Date: 06/29/2024 EXAM: MR Angiography Head without intravenous Contrast. 06/29/2024 05:07:25 AM TECHNIQUE: Magnetic resonance angiography images of the head without intravenous contrast. Multiplanar 2D and 3D reformatted images are provided for review. COMPARISON: MRI of the head dated 06/29/2024. CLINICAL HISTORY: Acute CVA. FINDINGS: ANTERIOR CIRCULATION: Moderate-to-severe stenosis of the horizontal petrous segment of the right internal carotid artery. Mild-to-moderate stenosis of the ophthalmic segments of the internal carotid arteries bilaterally. The right A1 segment is relatively hypoplastic. The anterior and middle cerebral arteries demonstrate no apparent hemodynamically significant stenosis. No aneurysm. POSTERIOR CIRCULATION: Fetal type origins of the posterior cerebral arteries. No significant stenosis of the posterior cerebral arteries. The basilar artery is somewhat hypoplastic secondary to the fetal type origins of the posterior cerebral arteries. It is also irregular in caliber with multifocal mild-to-moderate stenosis. There is diffuse irregularity and stenosis of the vertebral arteries bilaterally. The left vertebral artery is hypoplastic and appears to terminate in the posterior inferior cerebellar artery. No aneurysm. IMPRESSION: 1. Moderate-to-severe stenosis of the horizontal petrous segment of the right internal carotid artery. 2.  Mild-to-moderate stenosis of the ophthalmic segments of the internal carotid arteries bilaterally. 3. Diffuse irregularity and stenosis of the vertebral arteries bilaterally, with the left vertebral artery hypoplastic and terminating in the posterior inferior cerebellar artery. 4. 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 Electronically signed by: Evalene Coho MD 06/29/2024 05:18 AM EDT RP Workstation: [id number]   MR BRAIN WO CONTRAST Result Date: 06/29/2024 EXAM: MRI BRAIN WITHOUT CONTRAST 06/29/2024 12:57:00 AM TECHNIQUE: Multiplanar multisequence MRI of the head/brain was performed without the administration of intravenous contrast. COMPARISON: Comparison made with prior CT from 07/15/2023. CLINICAL HISTORY: FINDINGS: BRAIN AND VENTRICLES: Examination degraded by motion artifact. Subtle small focus of restricted diffusion seen involving the right paramedian midbrain, suspicious for a tiny acute ischemic nonhemorrhagic infarct (series 5, image [id number]). No associated hemorrhage or mass effect. Mild age related cerebral atrophy. Patchy T2 x FLAIR hyperintensities involving the periventricular and deep white matter of the cerebral  hemispheres, consistent with chronic small vessel ischemic disease, mild to moderate in nature. Few small remote right cerebellar infarcts. Chronic lacunar infarct present at the right basal ganglia. No intracranial hemorrhage. No mass. No midline shift. No hydrocephalus. The sella is unremarkable. Normal flow voids. ORBITS: Prior bilateral ocular lens replacement. SINUSES AND MASTOIDS: Scattered mucosal thickening present about the ethmoid air cells and maxillary sinuses. BONES AND SOFT TISSUES: Normal marrow signal. No acute soft tissue abnormality. IMPRESSION: 1. Subtle small focus of restricted diffusion involving the right paramedian midbrain, suspicious for a small acute ischemic  nonhemorrhagic infarct. 2. Underlying age-related atrophy with mild to moderate chronic microvascular ischemic disease. Small remote lacunar infarcts at the right basal ganglia and right cerebellum. Electronically signed by: Morene Hoard MD 06/29/2024 01:43 AM EDT RP Workstation: [id number]   DG Chest Port 1 View Result Date: 06/28/2024 EXAM: 1 VIEW XRAY OF THE CHEST 06/28/2024 04:47:00 PM COMPARISON: Comparison 06/03/2024 status post coronary artery bypass graft. CLINICAL HISTORY: Dyspnea. FINDINGS: LUNGS AND PLEURA: No focal pulmonary opacity. No pulmonary edema. No pleural effusion. No pneumothorax. HEART AND MEDIASTINUM: No acute abnormality of the cardiac and mediastinal silhouettes. BONES AND SOFT TISSUES: No acute osseous abnormality. IMPRESSION: 1. No acute cardiopulmonary process detected. Electronically signed by: Lynwood Seip MD 06/28/2024 04:56 PM EDT RP Workstation: [id number]    Vitals:   06/29/24 0806 06/29/24 0822 06/29/24 1206 06/29/24 1215  BP:  (!) 169/85 (!) 196/97 (!) 223/174  Pulse:  88 70 71  Resp:  (!) 21 20 18   Temp:  99 F (37.2 C) 98.6 F (37 C)   TempSrc:  Oral Oral   SpO2:  99% 100% 100%  Weight: 86.2 kg     Height: 5' 6 (1.676 m)        PHYSICAL EXAM General: Chronically ill-appearing elderly patient in no acute distress Psych:  Mood and affect appropriate for situation CV: Regular rate and rhythm on monitor Respiratory:  Regular, unlabored respirations on room air   NEURO:  Mental Status: AA&Ox3, patient is able to give clear and coherent history Speech/Language: speech is without dysarthria or aphasia.    Cranial Nerves:  II: PERRL. Visual fields full.  III, IV, VI: EOMI. Eyelids elevate symmetrically.  V: Sensation is intact to light touch and symmetrical to face.  VII: Subtle left facial droop VIII: hearing intact to voice. IX, X: Phonation is normal.  KP:Dynloizm shrug 5/5. XII: tongue is midline without fasciculations. Motor: 4+ /5  strength to all muscle groups tested.  Tone: is normal and bulk is normal Sensation- Intact to light touch bilaterally. Coordination: FTN intact bilaterally Gait- deferred  Able to count to 17 on a single breath  Most Recent NIH  1a Level of Conscious.: 0 1b LOC Questions: 0 1c LOC Commands: 0 2 Best Gaze: 0 3 Visual: 0 4 Facial Palsy: 1 5a Motor Arm - left: 0 5b Motor Arm - Right: 0 6a Motor Leg - Left: 0 6b Motor Leg - Right: 0 7 Limb Ataxia: 0 8 Sensory: 0 9 Best Language: 0 10 Dysarthria: 0 11 Extinct. and Inatten.: 0 TOTAL: 1   ASSESSMENT/PLAN  Fernando Hess is a 78 y.o. 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  Given location of stroke and appearance on MRI, feel that it is more likely due to small vessel  disease than cardioembolism.  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 OK to resume Eliquis  from stroke perspective when blood pressure is under control  Therapy recommendations:  Home Health PT Disposition: Pending  SOB with hypoxia Etiology sure Was treated with  BiPAP Much improved  Atrial fibrillation Home Meds: Eliquis  5 mg twice daily Continue telemetry monitoring Okay to resume anticoagulation with Eliquis  when blood pressure is under better control  Hypertension Home meds: Hydralazine  25 mg daily PRN Stable on the high end, but will need to be under better control before restarting Eliquis  Add amlodipine  10 mg daily Gradually normalize BP in 3-5 days 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 Long term BP goal normotensive  Hyperlipidemia Home meds: None LDL 123, goal < 70 Instructed patient to discuss PCSK9 inhibitor or Leqvio with primary care provider High intensity statin not recommended due to myasthenia gravis Continue statin at discharge  Diabetes type II poorly controlled Home meds: Lantus  36 units daily, insulin  lispro 8 units 3 times daily before meals HgbA1c 8.8, goal < 7.0 CBGs SSI Recommend close follow-up with PCP for better DM control  Seronegative myasthenia gravis Patient has history of seronegative myasthenia gravis, therapies with Mestinon and IVIG have not helped him in the past, and he has declined Ultomiris Patient denies diplopia and states that his shortness of breath has gotten better, able to count to 17 on a single breath, so doubt that he is having a flare of myasthenia gravis at this time. 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 Cortney E Everitt Clint Kill , MSN, AGACNP-BC [facility] See Amion for schedule and pager information 06/29/2024 1:17 PM    ATTENDING NOTE: I reviewed above note and agree with the assessment and plan. Pt was seen and examined.   No family at bedside.  Patient lying in bed, still has mild dysarthria and right facial droop, otherwise neuro intact.  Stated that shortness of breath much better, currently on nasal cannula.  MRI showed right dorsal pontine small infarct.  MRI did not show moderate proximal basilar artery stenosis, bilateral fetal PCA, stroke consistent with small/large vessel disease.  A1c 8.6, LDL 123, with AKI and mild leukocytosis.  Risk factor modification education provided.  BP still on the high end, on aspirin , will consider resume Eliquis  once BP  improved.  Not a candidate for statin given possible mesial gravis.  Will consider Leqvio.  PT and OT recommend home health.  Will follow  For detailed assessment and plan, please refer to above as I have made changes wherever appropriate.   Ary Cummins, MD PhD Stroke Neurology 06/29/2024 3:54 PM    To contact Stroke Continuity provider, please refer to [email or web address]. After hours, contact General Neurology

## 2024-06-29 NOTE — Progress Notes (Signed)
 NIF 30 FVC(L) 1.13 Pred% 34 FEV1(L) 1.02 Ped%43 FEV1/FVC 0.90

## 2024-06-29 NOTE — Evaluation (Incomplete)
 RT Evaluate and Treat Note  06/29/2024   Breathing is (select one): Same as normal    The following was found on auscultation (select multiple):  Bilateral Breath Sounds: Diminished;Rhonchi (06/28/24 1432)             Cough Assessment:      Most Recent Chest Xray:... (DG Chest Port 1 View Result Date: 06/28/2024 EXAM: 1 VIEW XRAY OF THE CHEST 06/28/2024 04:47:00 PM COMPARISON: Comparison 06/03/2024 status post coronary artery bypass graft. CLINICAL HISTORY: Dyspnea. FINDINGS: LUNGS AND PLEURA: No focal pulmonary opacity. No pulmonary edema. No pleural effusion. No pneumothorax. HEART AND MEDIASTINUM: No acute abnormality of the cardiac and mediastinal silhouettes. BONES AND SOFT TISSUES: No acute osseous abnormality. IMPRESSION: 1. No acute cardiopulmonary process detected. Electronically signed by: Lynwood Seip MD 06/28/2024 04:56 PM EDT RP Workstation: [id number]      The following medications and/or interventions were ordered/changed/discontinued as part of the Respiratory Treatment protocol:   Medication Changes: {Medication Changes:33379}   Airway Clearance Changes: {Airway Clearance Changes:33381}   Oxygen Therapy Changes:{Oxygen Therapy Changes:33382}

## 2024-06-29 NOTE — Progress Notes (Signed)
 Resp Mechanics:   NIF -36 FVC1.35 FEV1 1.18 FEV1/FVC .88 PEF 4.25

## 2024-06-29 NOTE — ED Notes (Signed)
 Pt's phone handed to him, from the charger at the nurse station.

## 2024-06-29 NOTE — Plan of Care (Signed)
Updated patient's wife over the phone. 

## 2024-06-29 NOTE — ED Notes (Signed)
Pt aware urine sample is needed. Urinal at bedside.  

## 2024-06-29 NOTE — ED Notes (Signed)
 Wife has taken pt's watch and rings home, along with his new glasses and left him with an older pair while at the hospital. Wife brought pt pajama pants (has his name label on them) and took his under clothes that were at bedside home.

## 2024-06-29 NOTE — Progress Notes (Signed)
 NIF: -30 x2 FVC: 1.63L FEV1: 1.42L FEV1/FVC: 0.87 PEF: 4.45

## 2024-06-29 NOTE — Progress Notes (Signed)
 FVC- 1.51L x 3 attempts w good effort NIF- -20cmH2o x 3 w good effort

## 2024-06-29 NOTE — Evaluation (Signed)
 Speech Language Pathology Evaluation Patient Details Name: Fernando Hess MRN: [id number] DOB: 1946/02/12 Today's Date: 06/29/2024 Time: 8482-8467 SLP Time Calculation (min) (ACUTE ONLY): 15 min  Problem List:  Patient Active Problem List   Diagnosis Date Noted   Acute CVA (cerebrovascular accident) ([name]) 06/29/2024   History of anxiety 06/28/2024   History of CAD (coronary artery disease) 06/28/2024   History of nocturnal hypoxia ([name]) 06/28/2024   Nocturnal hypoxia 03/03/2024   Shortness of breath 03/02/2024   Acute on chronic diastolic CHF (congestive heart failure) ([name]) 02/10/2024   Obesity, class 1 02/10/2024   SOB (shortness of breath) 09/15/2023   BPH (benign prostatic hyperplasia) 02/27/2023   History of anemia due to chronic kidney disease 02/27/2023   COVID-19 02/27/2023   COVID-19 virus infection 02/26/2023   Acute kidney injury superimposed on chronic kidney disease 02/03/2023   Transient hypotension 02/03/2023   SIRS (systemic inflammatory response syndrome) ([name]) 02/03/2023   Dementia without behavioral disturbance ([name]) 02/03/2023   Fall at home, initial encounter 02/03/2023   Acute respiratory failure with hypoxia ([name]) 02/03/2023   Hypokalemia 02/03/2023   Prolonged QT interval 02/03/2023   Left ankle pain 01/08/2023   Hematuria 01/08/2023   Fracture of femoral neck, right, closed ([name]) 12/24/2022   Depression with anxiety 12/24/2022   Acute metabolic encephalopathy 09/18/2022   Acute encephalopathy 09/17/2022   AKI (acute kidney injury) 09/04/2022   Generalized weakness 08/12/2022   History of seronegative myasthenia gravis ([name]) 04/21/2022   CKD stage 3b, GFR 30-44 ml/min ([name]) 03/21/2022   Posterior vitreous detachment of both eyes 01/23/2022   Constipation 11/13/2021   Insulin  dependent type 2 diabetes mellitus ([name]) 11/13/2021   Severe major depression, single episode, without psychotic features ([name]) 11/13/2021   Amaurosis fugax 11/13/2021    Amnesia 11/13/2021   Anxiety 11/13/2021   Cardiac arrhythmia 11/13/2021   Hearing loss 11/13/2021   Hypercoagulable state 11/13/2021   Hyperparathyroidism due to renal insufficiency 11/13/2021   Moderate aortic stenosis 11/05/2021   Paroxysmal atrial fibrillation ([name]) 11/05/2021   Nasal septal deviation 08/30/2021   Anticoagulated by anticoagulation treatment 07/16/2021   Syncope 05/10/2021   Gastro-esophageal reflux disease without esophagitis 07/25/2020   Globus pharyngeus 07/25/2020   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) 12/21/2019   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   (HFpEF) heart failure with preserved ejection fraction ([name]) 11/18/2019   Obstructive sleep apnea 03/07/2019   Peripheral artery disease    CKD (chronic kidney disease), stage IV ([name]) 11/29/2018   Intractable vascular headache 11/11/2018   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   Coronary artery disease involving native coronary artery of native heart without angina pectoris 09/24/2015   Moderate nonproliferative diabetic retinopathy of left eye with macular edema associated with type 2 diabetes mellitus ([name]) 03/01/2008   Hyperlipidemia    Essential hypertension    History of kidney stones    Past Medical History:  Past Medical History:  Diagnosis Date   Acute renal failure (ARF) 09/24/2015   Acute stress disorder 11/13/2021   Atrial premature complexes    CAD (coronary artery disease) of artery bypass graft    Early occlusion of saphenous vein graft to intermediate and marginal branch in February 2007 following bypass grafting    CAD (coronary artery disease), native coronary artery 2017   hx NSTEMI 09-24-2015  s/p  CABG x5 on 10-02-2015;  post op STEMI inferolateral wall,  SVG OM1 and SVG OM2 occluded, distal OM occlusion the calpruit,  treated medically // Myoview  7/21: no ischemia, EF 65, low risk   CHF (congestive heart failure) ([name])    CKD (chronic kidney  disease), stage III ([name])    Contusion of right knee 03/16/2020   Dyspnea    Elevated troponin    Erectile dysfunction    Esophageal reflux    History of atrial fibrillation    post op CABG 10-02-2015   History of kidney stones    History of non-ST elevation myocardial infarction (NSTEMI) 09/24/2015   s/p  CABG x5   History of ST elevation myocardial infarction (STEMI) 10/22/2015   inferior wall,  post op CABG 10-02-2015   Hyperlipidemia    Hypertension    Left ureteral stone    Mild atherosclerosis of both carotid arteries    Nephrolithiasis    per CT bilateral non-obstructive calculi   OSA (obstructive sleep apnea)    Peripheral artery disease    LE Arterial US  01/2019: R PTA and ATA occluded; L ATA occluded   RBBB (right bundle branch block)    Renal atrophy, right    Sleep apnea    wears cpap    ST elevation myocardial infarction (STEMI) of inferior wall ([name]) 10/22/2015   Type 2 diabetes mellitus treated with insulin  ([name])    followed by pcp   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) 03/01/2008   Wears glasses    Past Surgical History:  Past Surgical History:  Procedure Laterality Date   APPENDECTOMY  1965   CARDIAC CATHETERIZATION N/A 09/26/2015   Procedure: Left Heart Cath and Coronary Angiography;  Surgeon: Debby DELENA Sor, MD;  Location: [facility] INVASIVE CV LAB;  Service: Cardiovascular;  Laterality: N/A;   CARDIAC CATHETERIZATION N/A 10/22/2015   Procedure: Left Heart Cath and Coronary Angiography;  Surgeon: Ozell Fell, MD;  Location: [facility] INVASIVE CV LAB;  Service: Cardiovascular;  Laterality: N/A;   CATARACT EXTRACTION W/ INTRAOCULAR LENS  IMPLANT, BILATERAL  2017   COLONOSCOPY     CORONARY ARTERY BYPASS GRAFT N/A 10/02/2015   Procedure: CORONARY ARTERY BYPASS GRAFTING (CABG) X5 LIMA-LAD; SVG-DIAG; SVG-OM; SVG-PD; SVG-RAMUS TRANSESOPHAGEAL ECHOCARDIOGRAM (TEE) ENDOSCOPIC GREATER SAPHENOUS VEIN   HARVEST BILAT LE;  Surgeon: Maude Fleeta Ochoa, MD;  Location: [facility] OR;  Service: Open Heart Surgery;  Laterality: N/A;   CYSTOSCOPY/URETEROSCOPY/HOLMIUM LASER/STENT PLACEMENT Left 08/10/2018   Procedure: CYSTOSCOPY/URETEROSCOPY/HOLMIUM LASER/STENT PLACEMENT;  Surgeon: Nieves Cough, MD;  Location: [facility];  Service: Urology;  Laterality: Left;   CYSTOSCOPY/URETEROSCOPY/HOLMIUM LASER/STENT PLACEMENT Left 09/10/2018   Procedure: CYSTOSCOPY/URETEROSCOPY/HOLMIUM LASER/STENT EXCHANGE;  Surgeon: Nieves Cough, MD;  Location: [facility];  Service: Urology;  Laterality: Left;   HIP ARTHROPLASTY Right 12/26/2022   Procedure: RIGHT HEMI HIP ARTHROPLASTY;  Surgeon: Edna Toribio DELENA, MD;  Location: [facility] OR;  Service: Orthopedics;  Laterality: Right;   LEFT HEART CATHETERIZATION WITH CORONARY ANGIOGRAM N/A 04/13/2014   Procedure: LEFT HEART CATHETERIZATION WITH CORONARY ANGIOGRAM;  Surgeon: Elsie GORMAN Somerset, MD;  Location: [facility] CATH LAB;  Service: Cardiovascular;  Laterality: N/A;   LEG SURGERY Right age 18   closed reduction leg fracture   NASAL SEPTOPLASTY W/ TURBINOPLASTY Bilateral 08/30/2021   Procedure: NASAL SEPTOPLASTY WITH BILATERAL INFERIOR TURBINATE REDUCTION;  Surgeon: Mable Lenis, MD;  Location: [facility] OR;  Service: ENT;  Laterality: Bilateral;   POLYPECTOMY     RIGHT HEART CATH N/A 06/06/2021   Procedure: RIGHT HEART CATH;  Surgeon: Cherrie Toribio SAUNDERS, MD;  Location: [facility] INVASIVE CV LAB;  Service: Cardiovascular;  Laterality: N/A;   TEE WITHOUT CARDIOVERSION N/A 10/02/2015   Procedure: TRANSESOPHAGEAL ECHOCARDIOGRAM (TEE);  Surgeon: Maude Fleeta Ochoa, MD;  Location: [facility] OR;  Service: Open Heart Surgery;  Laterality: N/A;   URETEROSCOPY WITH HOLMIUM LASER LITHOTRIPSY Bilateral 2004;  2005  dr grapey  @[facility]    VASECTOMY     HPI:  78 yo male with PMH including major cognitive impairment, HFpEF, T2DM, CAD, paroxysmal A-fib on Eliquis , CKD 3B, OSA not on CPAP, anxiety, depression,  seronegative myasthenia gravis, and idiopathy progressive neuropathy presenting to ED 10/14 with SOB and generalized weakness. Imaging shows acute ischemic nonhemorrhagic infarction involving the R paramedian midbrain. Previous SLP evaluation reports primarily impaired judgement and awareness.   Assessment / Plan / Recommendation Clinical Impression  Pt has a history of cognitive impairment and reports no concerns related to cognitive-linguistic function, denying a change from baseline. He uses a fast rate when speaking, which impacts articulation and intelligibility. Hypernasality was also observed at times today. He presents with deficits related to sustained attention, memory, awareness, and problem solving. He can identify deficits and states he needs a lot of help from his wife but he effectively makes his needs known. He was attentive to conversation but this became more difficult with structured tasks. Memory was challening but accuracy increased when given Min cueing. Overall, suspect he is near his baseline but requires 24/7 supervision, which can be provided. Could consider f/u on an OP basis if desired but no acute SLP f/u is necessary. Signing off at this time.    SLP Assessment  SLP Recommendation/Assessment: All further Speech Language Pathology needs can be addressed in the next venue of care SLP Visit Diagnosis: Cognitive communication deficit (R41.841)     Assistance Recommended at Discharge  Frequent or constant Supervision/Assistance  Functional Status Assessment Patient has had a recent decline in their functional status and demonstrates the ability to make significant improvements in function in a reasonable and predictable amount of time.  Frequency and Duration           SLP Evaluation Cognition  Overall Cognitive Status: History of cognitive impairments - at baseline Arousal/Alertness: Awake/alert Orientation Level: Oriented X4 Attention: Sustained Sustained  Attention: Impaired Sustained Attention Impairment: Verbal basic;Functional basic Memory: Impaired Memory Impairment: Storage deficit;Retrieval deficit Awareness: Impaired Awareness Impairment: Emergent impairment Problem Solving: Impaired Problem Solving Impairment: Verbal basic       Comprehension  Auditory Comprehension Overall Auditory Comprehension: Appears within functional limits for tasks assessed    Expression Expression Primary Mode of Expression: Verbal Verbal Expression Overall Verbal Expression: Appears within functional limits for tasks assessed   Oral / Motor  Oral Motor/Sensory Function Overall Oral Motor/Sensory Function: Within functional limits Motor Speech Overall Motor Speech: Impaired Respiration: Within functional limits Phonation: Normal Resonance: Hyponasality Articulation: Impaired Level of Impairment: Conversation Intelligibility: Intelligibility reduced Conversation: 75-100% accurate            Damien Blumenthal, M.A., CCC-SLP Speech Language Pathology, Acute Rehabilitation Services  Secure Chat preferred [phone]  06/29/2024, 4:53 PM

## 2024-06-30 ENCOUNTER — Inpatient Hospital Stay (HOSPITAL_COMMUNITY)

## 2024-06-30 DIAGNOSIS — R29701 NIHSS score 1: Secondary | ICD-10-CM | POA: Diagnosis not present

## 2024-06-30 DIAGNOSIS — G7000 Myasthenia gravis without (acute) exacerbation: Secondary | ICD-10-CM | POA: Diagnosis not present

## 2024-06-30 DIAGNOSIS — I639 Cerebral infarction, unspecified: Secondary | ICD-10-CM | POA: Diagnosis not present

## 2024-06-30 DIAGNOSIS — I6329 Cerebral infarction due to unspecified occlusion or stenosis of other precerebral arteries: Secondary | ICD-10-CM | POA: Diagnosis not present

## 2024-06-30 DIAGNOSIS — E1151 Type 2 diabetes mellitus with diabetic peripheral angiopathy without gangrene: Secondary | ICD-10-CM | POA: Diagnosis not present

## 2024-06-30 DIAGNOSIS — I480 Paroxysmal atrial fibrillation: Secondary | ICD-10-CM | POA: Diagnosis not present

## 2024-06-30 DIAGNOSIS — I779 Disorder of arteries and arterioles, unspecified: Secondary | ICD-10-CM | POA: Diagnosis not present

## 2024-06-30 DIAGNOSIS — J9611 Chronic respiratory failure with hypoxia: Secondary | ICD-10-CM | POA: Diagnosis not present

## 2024-06-30 LAB — GLUCOSE, CAPILLARY
Glucose-Capillary: 225 mg/dL — ABNORMAL HIGH (ref 70–99)
Glucose-Capillary: 221 mg/dL — ABNORMAL HIGH (ref 70–99)
Glucose-Capillary: 326 mg/dL — ABNORMAL HIGH (ref 70–99)
Glucose-Capillary: 256 mg/dL — ABNORMAL HIGH (ref 70–99)

## 2024-06-30 LAB — CBC
HCT: 40.6 % (ref 39.0–52.0)
Hemoglobin: 12.9 g/dL — ABNORMAL LOW (ref 13.0–17.0)
MCH: 26.5 pg (ref 26.0–34.0)
MCHC: 31.8 g/dL (ref 30.0–36.0)
MCV: 83.4 fL (ref 80.0–100.0)
Platelets: 238 K/uL (ref 150–400)
RBC: 4.87 MIL/uL (ref 4.22–5.81)
RDW: 15.2 % (ref 11.5–15.5)
WBC: 14.2 K/uL — ABNORMAL HIGH (ref 4.0–10.5)
nRBC: 0 % (ref 0.0–0.2)

## 2024-06-30 LAB — RESPIRATORY PANEL BY PCR

## 2024-06-30 LAB — RENAL FUNCTION PANEL
Albumin: 2.9 g/dL — ABNORMAL LOW (ref 3.5–5.0)
Anion gap: 11 (ref 5–15)
BUN: 26 mg/dL — ABNORMAL HIGH (ref 8–23)
CO2: 21 mmol/L — ABNORMAL LOW (ref 22–32)
Calcium: 8.6 mg/dL — ABNORMAL LOW (ref 8.9–10.3)
Chloride: 105 mmol/L (ref 98–111)
Creatinine, Ser: 2.07 mg/dL — ABNORMAL HIGH (ref 0.61–1.24)
GFR, Estimated: 32 mL/min — ABNORMAL LOW (ref >60)
Glucose, Bld: 223 mg/dL — ABNORMAL HIGH (ref 70–99)
Phosphorus: 3.2 mg/dL (ref 2.5–4.6)
Potassium: 3.8 mmol/L (ref 3.5–5.1)
Sodium: 137 mmol/L (ref 135–145)

## 2024-06-30 LAB — MAGNESIUM: Magnesium: 1.7 mg/dL (ref 1.7–2.4)

## 2024-06-30 MED ORDER — INSULIN ASPART 100 UNIT/ML IJ SOLN
6.0000 [IU] | Freq: Three times a day (TID) | INTRAMUSCULAR | Status: DC
  Administered 2024-07-01: 6 [IU] via SUBCUTANEOUS

## 2024-06-30 NOTE — Progress Notes (Signed)
 PROGRESS NOTE  Fernando Hess FMW:[id number] DOB: [birth year]   PCP: Okey Carlin Redbird, MD  Patient is from: Home.  Lives his wife.  Uses rolling walker at baseline.  DOA: 06/28/2024 LOS: 2  Chief complaints Chief Complaint  Patient presents with   Respiratory Distress   Shortness of Breath     Brief Narrative / Interim history: 78 year old M with PMH of major cognitive impairment, HFpEF, DM-2, CAD, paroxysmal A-fib on Eliquis , CKD-3B, OSA not on CPAP, anxiety, depression, seronegative myasthenia gravis and idiopathic progressive neuropathy followed by neurology outpatient presenting with shortness of breath and generalized weakness starting the morning of presentation. In ED, SBP elevated to 200s.  Initially received Nitropaste and started on nitro drip.  There was some concern about myasthenia gravis exacerbation and neurology was consulted.  However, MRI brain showed acute subtotal right paramedian pontine infarct, and complete CVA workup initiated per recommendation by neurology.   MRA head with moderate to severe stenosis of petrous right ICA and diffuse irregularity and stenosis of bilateral vertebral arteries.  Carotid Doppler with 1 to 39% stenosis in right ICA and 50 to 59% stenosis in left ICA.  TTE without significant finding.  LDL 123.  A1c 8.8%.   Therapy recommended home health.  Subjective: Seen and examined earlier this morning.  Patient's wife at bedside.  She stated that he called her last night about 7 PM stating that he was not able to speak.  She called front desk.  Upon bedside assessment by overnight RN, patient did not report symptoms and stated that he had no further needs.  No complaint this morning.  Denies chest pain, shortness of breath, cough, palpitation, dizziness, nausea, vomiting, abdominal pain or focal neurosymptoms.   Objective: Vitals:   06/30/24 0910 06/30/24 1131 06/30/24 1349 06/30/24 1528  BP: (!) 148/77 (!) 155/69 (!) 155/69 (!) 149/75   Pulse:  78  84  Resp:  20  18  Temp:  98.6 F (37 C)  98.3 F (36.8 C)  TempSrc:  Oral  Oral  SpO2:  93%    Weight:      Height:        Examination:  GENERAL: No apparent distress.  Nontoxic.  Sitting on bedside chair. HEENT: MMM.  Vision and hearing grossly intact.  NECK: Supple.  No apparent JVD.  RESP:  No IWOB.  Fair aeration bilaterally. CVS:  RRR. Heart sounds normal.  ABD/GI/GU: BS+. Abd soft, NTND.  MSK/EXT:  Moves extremities. No apparent deformity. No edema.  SKIN: no apparent skin lesion or wound NEURO: Awake and alert.  Oriented x 4. Speech clear. Cranial nerves II-XII intact. Motor intact and symmetric in all extremities.  Light sensation intact in all dermatomes. Patellar reflex symmetric.  No pronator drift.  Finger to nose intact.  No nystagmus. PSYCH: Calm. Normal affect.   Consultants:  Neurology  Procedures: None  Microbiology summarized: COVID-19, influenza, RSV and 20 pathogen RVP nonreactive  Assessment and plan: Shortness of breath/respiratory distress: No desaturation but initially started on BiPAP.  Serial troponin, BNP within normal.  A 20 pathogen RVP nonreactive.  Initial and repeat CXR without acute finding.  SBP was elevated to 200s.  Initially started on nitro drip that was later discontinued due to stroke.  Has history of OSA.  VBG reassuring.  History of possible seronegative myasthenia gravis and idiopathic progressive neuropathy.  NIF -36. -Treat hypertension and CAD as below - IS  Acute ischemic paramedian pontine CVA: Bleed due to small vessel disease.  However, he carries history of CVA.  SBP elevated to 200s.  CVA workup as above. - Neurology on board-recommended continuing aspirin , resuming Eliquis  - Per neuro, not a candidate for statin due to myasthenia gravis.  Started on Leqvio. -Neurology recommends avoiding hypotension. - Therapy recommended home health.  Hypertensive urgency: BP elevated but improved.  Gradually normalize  BP - Continue home amlodipine  and hydralazine  - IV hydralazine  as needed  History of possible seronegative myasthenia gravis?  Followed by neurology outpatient.  Previously failed treatment by Mestinon and IVIG.  Supposed to be followed up with neuromuscular speciality.  Unclear if he pursued this. -Outpatient follow-up  Idiopathic progressive neuropathy: On vitamin B12 outpatient.  Vitamin B12 level elevated. -Currently holding vitamin B12. -Followed by neurology outpatient -Outpatient follow-up with primary neurologist  IDDM-2 with hyperglycemia: A1c 8.8%.  CBG elevated.  Unclear if the latest one is Premeal. Recent Labs  Lab 06/29/24 1547 06/29/24 2149 06/30/24 0616 06/30/24 1129 06/30/24 1530  GLUCAP 235* 190* 225* 221* 326*  -Continue Lantus  36 units daily -Continue SSI-moderate -Increase NovoLog  from 4 to 6 units 3 times daily with meals -Further adjustment as appropriate  Paroxysmal A-fib/RBBB/LAFB: Rate controlled irregular rhythm but not sinus.  On Eliquis  for anticoagulation. - Reduced Eliquis  to 2.5 mg twice daily given his age and renal function - Optimize electrolytes.  Vitamin B12 deficiency: B12 elevated to 2000's. -Hold vitamin B12   Obstructive sleep apnea not on CPAP.  VBG reassuring.   History of anxiety and depression stable -Continue Seroquel  and Lamictal    History of CAD: No chest pain.  Serial troponin negative.  CT without significant finding -Antiplatelet as above   CKD stage IV: Baseline Cr ranges from 1.7-2.3.  Stable. -Avoid nephrotoxic meds  Dementia?  Awake and alert and oriented x 4. -Continue home Aricept . -Reorientation and delirium precaution   BPH with urinary incontinence -Continue Flomax   History of glucoma -Continue latanoprost  Leukocytosis: Mild.  UA, RVP and CXR unrevealing.  No fever. - Check blood culture but low suspicion for bacteremia - Recheck in the morning  Class I obesity Body mass index is 30.21 kg/m.           DVT prophylaxis:  apixaban  (ELIQUIS ) tablet 2.5 mg Start: 06/29/24 2200 SCD's Start: 06/29/24 0245 SCDs Start: 06/28/24 2253 apixaban  (ELIQUIS ) tablet 2.5 mg  Code Status: Full code Family Communication: None at bedside. Level of care: Telemetry Medical Status is: Inpatient Remains inpatient appropriate because: Acute CVA, uncontrolled hypertension   Final disposition: Likely home   35 minutes with more than 50% spent in reviewing records, counseling patient/family and coordinating care.   Sch Meds:  Scheduled Meds:  amLODipine   10 mg Oral Daily   apixaban   2.5 mg Oral BID   aspirin  EC  81 mg Oral Daily   donepezil   10 mg Oral QHS   furosemide   40 mg Oral Q M,W,F   hydrALAZINE   25 mg Oral Q8H   insulin  aspart  0-15 Units Subcutaneous TID WC   insulin  aspart  0-5 Units Subcutaneous QHS   insulin  aspart  4 Units Subcutaneous TID WC   insulin  glargine-yfgn  36 Units Subcutaneous Daily   lamoTRIgine   25 mg Oral Daily   latanoprost  1 drop Both Eyes QHS   pantoprazole   40 mg Oral Daily   QUEtiapine   25 mg Oral QHS   sodium chloride  flush  3 mL Intravenous Q12H   tamsulosin   0.4 mg Oral Daily   Continuous Infusions:   PRN  Meds:.acetaminophen  **OR** acetaminophen , hydrALAZINE , mouth rinse, senna-docusate, sodium chloride  flush  Antimicrobials: Anti-infectives (From admission, onward)    None        I have personally reviewed the following labs and images: CBC: Recent Labs  Lab 06/28/24 1655 06/28/24 1726 06/29/24 0623 06/30/24 0235  WBC 9.2  --  11.6* 14.2*  NEUTROABS 6.3  --   --   --   HGB 13.0 13.6  13.9 12.3* 12.9*  HCT 41.8 40.0  41.0 38.9* 40.6  MCV 84.3  --  84.2 83.4  PLT 240  --  213 238   BMP &GFR Recent Labs  Lab 06/28/24 1655 06/28/24 1726 06/29/24 0623 06/30/24 0235  NA 137 139  139 140 137  K 4.2 4.2  4.3 3.4* 3.8  CL 104 105 109 105  CO2 23  --  23 21*  GLUCOSE 312* 315* 209* 223*  BUN 29* 31* 27* 26*  CREATININE  2.24* 2.20* 1.92* 2.07*  CALCIUM  8.5*  --  7.6* 8.6*  MG 2.0  --   --  1.7  PHOS  --   --   --  3.2   Estimated Creatinine Clearance: 29.1 mL/min (A) (by C-G formula based on SCr of 2.07 mg/dL (H)). Liver & Pancreas: Recent Labs  Lab 06/28/24 1655 06/29/24 0623 06/30/24 0235  AST 17 13*  --   ALT 11 10  --   ALKPHOS 107 88  --   BILITOT 0.8 0.8  --   PROT 6.3* 5.4*  --   ALBUMIN  3.0* 2.6* 2.9*   No results for input(s): LIPASE, AMYLASE in the last 168 hours. No results for input(s): AMMONIA in the last 168 hours. Diabetic: Recent Labs    06/29/24 0836  HGBA1C 8.8*   Recent Labs  Lab 06/29/24 1547 06/29/24 2149 06/30/24 0616 06/30/24 1129 06/30/24 1530  GLUCAP 235* 190* 225* 221* 326*   Cardiac Enzymes: No results for input(s): CKTOTAL, CKMB, CKMBINDEX, TROPONINI in the last 168 hours. No results for input(s): PROBNP in the last 8760 hours. Coagulation Profile: No results for input(s): INR, PROTIME in the last 168 hours. Thyroid  Function Tests: No results for input(s): TSH, T4TOTAL, FREET4, T3FREE, THYROIDAB in the last 72 hours. Lipid Profile: Recent Labs    06/29/24 0500  CHOL 171  HDL 28*  LDLCALC 123*  TRIG 100  CHOLHDL 6.1   Anemia Panel: Recent Labs    06/29/24 0836  VITAMINB12 2,385*   Urine analysis:    Component Value Date/Time   COLORURINE YELLOW 06/29/2024 1330   APPEARANCEUR CLEAR 06/29/2024 1330   LABSPEC 1.021 06/29/2024 1330   PHURINE 5.0 06/29/2024 1330   GLUCOSEU >=500 (A) 06/29/2024 1330   HGBUR SMALL (A) 06/29/2024 1330   BILIRUBINUR NEGATIVE 06/29/2024 1330   KETONESUR NEGATIVE 06/29/2024 1330   PROTEINUR 100 (A) 06/29/2024 1330   UROBILINOGEN 0.2 10/02/2009 1604   NITRITE NEGATIVE 06/29/2024 1330   LEUKOCYTESUR TRACE (A) 06/29/2024 1330   Sepsis Labs: Invalid input(s): PROCALCITONIN, LACTICIDVEN  Microbiology: Recent Results (from the past 240 hours)  Resp panel by RT-PCR (RSV, Flu  A&B, Covid) Anterior Nasal Swab     Status: None   Collection Time: 06/28/24  4:55 PM   Specimen: Anterior Nasal Swab  Result Value Ref Range Status   SARS Coronavirus 2 by RT PCR NEGATIVE NEGATIVE Final   Influenza A by PCR NEGATIVE NEGATIVE Final   Influenza B by PCR NEGATIVE NEGATIVE Final    Comment: (NOTE) The Xpert Xpress SARS-CoV-2/FLU/RSV plus  assay is intended as an aid in the diagnosis of influenza from Nasopharyngeal swab specimens and should not be used as a sole basis for treatment. Nasal washings and aspirates are unacceptable for Xpert Xpress SARS-CoV-2/FLU/RSV testing.  Fact Sheet for Patients: BloggerCourse.com  Fact Sheet for Healthcare Providers: SeriousBroker.it  This test is not yet approved or cleared by the [place]  FDA and has been authorized for detection and/or diagnosis of SARS-CoV-2 by FDA under an Emergency Use Authorization (EUA). This EUA will remain in effect (meaning this test can be used) for the duration of the COVID-19 declaration under Section 564(b)(1) of the Act, 21 U.S.C. section 360bbb-3(b)(1), unless the authorization is terminated or revoked.     Resp Syncytial Virus by PCR NEGATIVE NEGATIVE Final    Comment: (NOTE) Fact Sheet for Patients: BloggerCourse.com  Fact Sheet for Healthcare Providers: SeriousBroker.it  This test is not yet approved or cleared by the [place]  FDA and has been authorized for detection and/or diagnosis of SARS-CoV-2 by FDA under an Emergency Use Authorization (EUA). This EUA will remain in effect (meaning this test can be used) for the duration of the COVID-19 declaration under Section 564(b)(1) of the Act, 21 U.S.C. section 360bbb-3(b)(1), unless the authorization is terminated or revoked.  Performed at [facility], [address] [place], [name] [address]   Respiratory (~20 pathogens)  panel by PCR     Status: None   Collection Time: 06/30/24  7:48 AM   Specimen: Nasopharyngeal Swab; Respiratory  Result Value Ref Range Status   Adenovirus NOT DETECTED NOT DETECTED Final   Coronavirus 229E NOT DETECTED NOT DETECTED Final    Comment: (NOTE) The Coronavirus on the Respiratory Panel, DOES NOT test for the novel  Coronavirus (2019 nCoV)    Coronavirus HKU1 NOT DETECTED NOT DETECTED Final   Coronavirus NL63 NOT DETECTED NOT DETECTED Final   Coronavirus OC43 NOT DETECTED NOT DETECTED Final   Metapneumovirus NOT DETECTED NOT DETECTED Final   Rhinovirus / Enterovirus NOT DETECTED NOT DETECTED Final   Influenza A NOT DETECTED NOT DETECTED Final   Influenza B NOT DETECTED NOT DETECTED Final   Parainfluenza Virus 1 NOT DETECTED NOT DETECTED Final   Parainfluenza Virus 2 NOT DETECTED NOT DETECTED Final   Parainfluenza Virus 3 NOT DETECTED NOT DETECTED Final   Parainfluenza Virus 4 NOT DETECTED NOT DETECTED Final   Respiratory Syncytial Virus NOT DETECTED NOT DETECTED Final   Bordetella pertussis NOT DETECTED NOT DETECTED Final   Bordetella Parapertussis NOT DETECTED NOT DETECTED Final   Chlamydophila pneumoniae NOT DETECTED NOT DETECTED Final   Mycoplasma pneumoniae NOT DETECTED NOT DETECTED Final    Comment: Performed at [facility], [address] [place], [name] [address]    Radiology Studies: DG Chest 2 View Result Date: 06/30/2024 CLINICAL DATA:  Shortness of breath. EXAM: CHEST - 2 VIEW COMPARISON:  06/28/2024 FINDINGS: The heart size and mediastinal contours are within normal limits. Prior CABG again noted. Low lung volumes are again seen. No No evidence of focal consolidation or pleural effusion. The visualized skeletal structures are unremarkable. IMPRESSION: Low lung volumes. No active cardiopulmonary disease. Electronically Signed   By: Norleen DELENA Kil M.D.   On: 06/30/2024 09:10      Angelito Hopping T. Christene Pounds [facility]  If 7PM-7AM, please contact  night-coverage [email or web address] 06/30/2024, 4:03 PM

## 2024-06-30 NOTE — Progress Notes (Addendum)
 STROKE TEAM PROGRESS NOTE   INTERIM HISTORY/SUBJECTIVE  Patient's blood pressure has improved, and he denies shortness of breath.  He is amenable to starting Leqvio.  OBJECTIVE  CBC    Component Value Date/Time   WBC 14.2 (H) 06/30/2024 0235   RBC 4.87 06/30/2024 0235   HGB 12.9 (L) 06/30/2024 0235   HGB 15.8 05/31/2021 1438   HCT 40.6 06/30/2024 0235   HCT 45.2 05/31/2021 1438   PLT 238 06/30/2024 0235   PLT 251 05/31/2021 1438   MCV 83.4 06/30/2024 0235   MCV 87 05/31/2021 1438   MCH 26.5 06/30/2024 0235   MCHC 31.8 06/30/2024 0235   RDW 15.2 06/30/2024 0235   RDW 13.2 05/31/2021 1438   LYMPHSABS 1.6 06/28/2024 1655   MONOABS 1.0 06/28/2024 1655   EOSABS 0.2 06/28/2024 1655   BASOSABS 0.1 06/28/2024 1655    BMET    Component Value Date/Time   NA 137 06/30/2024 0235   NA 138 11/06/2021 1031   K 3.8 06/30/2024 0235   CL 105 06/30/2024 0235   CO2 21 (L) 06/30/2024 0235   GLUCOSE 223 (H) 06/30/2024 0235   BUN 26 (H) 06/30/2024 0235   BUN 34 (H) 11/06/2021 1031   CREATININE 2.07 (H) 06/30/2024 0235   CALCIUM  8.6 (L) 06/30/2024 0235   EGFR 34 (L) 11/06/2021 1031   GFRNONAA 32 (L) 06/30/2024 0235    IMAGING past 24 hours DG Chest 2 View Result Date: 06/30/2024 CLINICAL DATA:  Shortness of breath. EXAM: CHEST - 2 VIEW COMPARISON:  06/28/2024 FINDINGS: The heart size and mediastinal contours are within normal limits. Prior CABG again noted. Low lung volumes are again seen. No No evidence of focal consolidation or pleural effusion. The visualized skeletal structures are unremarkable. IMPRESSION: Low lung volumes. No active cardiopulmonary disease. Electronically Signed   By: Norleen DELENA Kil M.D.   On: 06/30/2024 09:10    Vitals:   06/30/24 0744 06/30/24 0910 06/30/24 1131 06/30/24 1349  BP: (!) 148/77 (!) 148/77 (!) 155/69 (!) 155/69  Pulse: 79  78   Resp: 20  20   Temp: 98.5 F (36.9 C)  98.6 F (37 C)   TempSrc: Oral  Oral   SpO2: 98%  93%   Weight:       Height:         PHYSICAL EXAM General: Chronically ill-appearing elderly patient in no acute distress Psych:  Mood and affect appropriate for situation CV: Regular rate and rhythm on monitor Respiratory:  Regular, unlabored respirations on room air   NEURO:  Mental Status: AA&Ox3, patient is able to give clear and coherent history Speech/Language: speech is without dysarthria or aphasia.    Cranial Nerves:  II: PERRL.   III, IV, VI: EOMI. Eyelids elevate symmetrically.  V: Sensation is intact to light touch and symmetrical to face.  VII: Subtle left facial droop VIII: hearing intact to voice. IX, X: Phonation is normal.  KP:Dynloizm shrug 5/5. XII: tongue is midline without fasciculations. Motor: 4+ /5 strength to all muscle groups tested.  Tone: is normal and bulk is normal Sensation- Intact to light touch bilaterally. Coordination: FTN intact bilaterally Gait- deferred   Most Recent NIH  1a Level of Conscious.: 0 1b LOC Questions: 0 1c LOC Commands: 0 2 Best Gaze: 0 3 Visual: 0 4 Facial Palsy: 1 5a Motor Arm - left: 0 5b Motor Arm - Right: 0 6a Motor Leg - Left: 0 6b Motor Leg - Right: 0 7 Limb Ataxia: 0 8  Sensory: 0 9 Best Language: 0 10 Dysarthria: 0 11 Extinct. and Inatten.: 0 TOTAL: 1   ASSESSMENT/PLAN  Mr. NHIA HEAPHY is a 78 y.o. 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  Given location of stroke and appearance on MRI, feel that it is more likely due to small vessel disease than cardioembolism.  Stroke: Right paramedian pontine infarct, etiology: Small / large vessel disease MRI right paramedian pontine infarct, underlying age-related atrophy with mild to moderate chronic microvascular ischemic disease MRA moderate to severe stenosis of horizontal petrous segment of  right ICA, mild to moderate stenosis of ophthalmic segments of internal carotid arteries bilaterally, diffuse irregularity and stenosis of bilateral vertebral arteries with hypoplastic left vertebral artery terminating in left PICA, hypoplastic basilar artery with proximal moderate stenosis, b/l fetal PCAs Carotid Doppler 40 to 59% stenosis in left ICA 2D Echo EF 70-75% LDL 123 HgbA1c 8.8 VTE prophylaxis -SCDs Eliquis  (apixaban ) daily prior to admission, now on aspirin  81 mg daily and  eliquis  2.5 bid. Continue on discharge.  Therapy recommendations:  Home Health PT Disposition: Pending  SOB with hypoxia Etiology sure Was treated with BiPAP Much improved  Atrial fibrillation Home Meds: was on Eliquis  5 mg twice daily Continue telemetry monitoring Continue home eliquis , but decreased dose to 2.5mg  bid per criteria (age Cre)   Hypertension Home meds: Hydralazine  25 mg daily PRN Blood pressure is stable but still higher than optimal, encouraged patient to follow-up with his primary care provider for better control of hypertension Continue amlodipine  10 mg daily Gradually normalize BP in 3-5 days 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 Long term BP goal normotensive  Hyperlipidemia Home meds: None LDL 123, goal < 70 Patient is amenable to Leqvio, paper form signed High intensity statin not recommended due to myasthenia gravis  Diabetes type II poorly controlled Home meds: Lantus  36 units daily, insulin  lispro 8 units 3 times daily before meals HgbA1c 8.8, goal < 7.0 CBGs SSI Recommend close follow-up with PCP for better DM control  Seronegative myasthenia gravis Patient has history of seronegative myasthenia gravis, therapies with Mestinon and IVIG have not helped him in the past, and he has declined Ultomiris Patient denies diplopia and states that his shortness of breath has gotten better, able to count to 17 on a single breath, so doubt that he is having a flare of  myasthenia gravis at this time. 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 Cortney E Everitt Clint Kill , MSN, AGACNP-BC [facility] See Amion for schedule and pager information 06/30/2024 2:17 PM  ATTENDING NOTE: I reviewed above note and agree with the assessment and plan. Pt was seen and examined.   Pt sitting in chair, no complains, breathing comfortably. No complains, no HA.  BP under control, continue ASA and start eliquis  2.5mg  lower dose based on age and Cre. Signed leqvio form for LDL treatment. No able to have statin due to possible MG. PT OT recommend HH. Pt will follow up with his outpt neurologist in [place].   For detailed assessment and plan, please refer to above as I have made changes wherever appropriate.   Neurology will sign off. Please call with questions. Pt will follow up with his neurologist at [facility] in about 4 weeks. Thanks for the consult.   Ary Cummins, MD PhD Stroke Neurology 06/30/2024 4:24 PM    To contact Stroke Continuity provider, please refer to [email or web address]. After hours, contact General Neurology

## 2024-06-30 NOTE — Evaluation (Signed)
 Occupational Therapy Evaluation Patient Details Name: Fernando Hess MRN: [id number] DOB: 1946/06/07 Today's Date: 06/30/2024   History of Present Illness   Pt is an 78 y/o male presenting with shortness of breath and associated generalized weakness; Imaging showing acute ischemic nonhemorrhagic infarction involving the right paramedian midbrain.  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 Pt presents now with minor deficits in cognition, standing balance and LUE strength (fairly close to RUE). Bed noted to be soiled w/ urine; pt requiring Min A for UB ADL and Min-Mod A for LB ADL mgmt. Pt able to mobilize in room using RW with no more than CGA and fair pacing. Anticipate pt will be able to return home with [facility] and family assist.   HR 102bpm    If plan is discharge home, recommend the following:   A little help with bathing/dressing/bathroom;Assistance with cooking/housework;Direct supervision/assist for medications management;Direct supervision/assist for financial management;Assist for transportation;Help with stairs or ramp for entrance     Functional Status Assessment   Patient has had a recent decline in their functional status and demonstrates the ability to make significant improvements in function in a reasonable and predictable amount of time.     Equipment Recommendations   None recommended by OT     Recommendations for Other Services         Precautions/Restrictions   Precautions Precautions: Fall Restrictions Weight Bearing Restrictions Per Provider Order: No     Mobility Bed  Mobility Overal bed mobility: Needs Assistance Bed Mobility: Supine to Sit     Supine to sit: Min assist, HOB elevated, Used rails     General bed mobility comments: assist to lift trunk with handheld assist    Transfers Overall transfer level: Needs assistance Equipment used: Rolling walker (2 wheels) Transfers: Sit to/from Stand Sit to Stand: Contact guard assist           General transfer comment: pt pulling on RW to stand, some difficulty but able to manage      Balance Overall balance assessment: Needs assistance Sitting-balance support: No upper extremity supported, Feet supported Sitting balance-Leahy Scale: Good     Standing balance support: Bilateral upper extremity supported, During functional activity, Reliant on assistive device for balance Standing balance-Leahy Scale: Poor                             ADL either performed or assessed with clinical judgement   ADL Overall ADL's : Needs assistance/impaired Eating/Feeding: Set up;Sitting   Grooming: Set up;Sitting   Upper Body Bathing: Minimal assistance;Sitting   Lower Body Bathing: Sit to/from stand;Sitting/lateral leans;Minimal assistance   Upper Body Dressing : Minimal assistance;Sitting Upper Body Dressing Details (indicate cue type and reason): changing soiled gown Lower Body Dressing: Moderate assistance;Sitting/lateral leans;Sit to/from stand   Toilet Transfer: Contact guard assist;Ambulation;Rolling walker (2 wheels)   Toileting- Clothing Manipulation and Hygiene: Minimal assistance;Sitting/lateral lean;Sit to/from stand       Functional mobility during ADLs: Contact guard assist;Rolling walker (2 wheels)       Vision Baseline Vision/History: 1 Wears glasses Ability to See in Adequate Light: 0 Adequate Patient Visual Report: No change  from baseline Vision Assessment?: No apparent visual deficits     Perception         Praxis         Pertinent Vitals/Pain Pain  Assessment Pain Assessment: No/denies pain     Extremity/Trunk Assessment Upper Extremity Assessment Upper Extremity Assessment: LUE deficits/detail;Right hand dominant LUE Deficits / Details: biceps/shoulder slightly weaker than RUE but overall functional   Lower Extremity Assessment Lower Extremity Assessment: Defer to PT evaluation   Cervical / Trunk Assessment Cervical / Trunk Assessment: Normal   Communication Communication Communication: Other (comment) (short, one word answers)   Cognition Arousal: Alert Behavior During Therapy: WFL for tasks assessed/performed Cognition: History of cognitive impairments             OT - Cognition Comments: hx of cognitive impairments per chart. Pt with short, one word answers throughout session but following commands consistently. decreased awareness of incontinence and need for clean up                 Following commands: Intact       Cueing  General Comments   Cueing Techniques: Verbal cues;Gestural cues;Tactile cues      Exercises     Shoulder Instructions      Home Living Family/patient expects to be discharged to:: Private residence Living Arrangements: Spouse/significant other Available Help at Discharge: Family Type of Home: House Home Access: Stairs to enter [employer] of Steps: 7 Entrance Stairs-Rails: Right Home Layout: Two level;Able to live on main level with bedroom/bathroom     Bathroom Shower/Tub: Tub/shower unit   Bathroom Toilet: Handicapped height Bathroom Accessibility: Yes   Home Equipment: Agricultural consultant (2 wheels);Shower seat;Grab bars - tub/shower;BSC/3in1;Wheelchair - Public librarian (4 wheels)          Prior Functioning/Environment Prior Level of Function : Needs assist             Mobility Comments: Rollator for mobility, denies recent falls ADLs Comments: Reports Mod I for dressing and wife assists with showering. per chart, pt with recent bouts  of urine incontinence. Wife manages IADLs    OT Problem List: Decreased strength;Decreased activity tolerance;Impaired balance (sitting and/or standing);Decreased cognition   OT Treatment/Interventions: Self-care/ADL training;Therapeutic exercise;Energy conservation;DME and/or AE instruction;Therapeutic activities;Patient/family education;Balance training      OT Goals(Current goals can be found in the care plan section)   Acute Rehab OT Goals Patient Stated Goal: go home soon OT Goal Formulation: With patient Time For Goal Achievement: 07/14/24 Potential to Achieve Goals: Good   OT Frequency:  Min 2X/week    Co-evaluation              AM-PAC OT 6 Clicks Daily Activity     Outcome Measure Help from another person eating meals?: None Help from another person taking care of personal grooming?: A Little Help from another person toileting, which includes using toliet, bedpan, or urinal?: A Little Help from another person bathing (including washing, rinsing, drying)?: A Little Help from another person to put on and taking off regular upper body clothing?: A Little Help from another person to put on and taking off regular lower body clothing?: A Lot 6 Click Score: 18   End of Session Equipment Utilized During Treatment: Gait belt;Rolling walker (2 wheels) Nurse Communication: Mobility status  Activity Tolerance: Patient tolerated treatment well Patient left: in chair;with call bell/phone within reach;with chair alarm set  OT Visit Diagnosis: Unsteadiness on feet (R26.81);Other abnormalities of gait and mobility (R26.89);Muscle weakness (generalized) (M62.81)  Time: 9191-9170 OT Time Calculation (min): 21 min Charges:  OT General Charges $OT Visit: 1 Visit OT Evaluation $OT Eval Low Complexity: 1 Low  Mliss NOVAK, OTR/L Acute Rehab Services Office: [phone]   Mliss Fish 06/30/2024, 8:41 AM

## 2024-06-30 NOTE — Plan of Care (Signed)

## 2024-06-30 NOTE — Progress Notes (Signed)
 FVC 1.64 L  NIF -25  Good patient effort.

## 2024-06-30 NOTE — TOC Initial Note (Signed)
 Transition of Care [facility]) - Initial/Assessment Note    Patient Details  Name: Fernando Hess MRN: [id number] Date of Birth: 01/16/1946  [facility]) CM/SW Contact:    Waddell Barnie Rama, RN Phone Number: 06/30/2024, 11:00 AM  Clinical Narrative:                 From home with spouse, has PCP and insurance on file, states has no HH services in place at this time , has rolling walker at home.  States family member  (wife) will transport them home at [employer] and family is support system, [facility] [address].  Pta self ambulatory with walker.  Per  pt/ot eval rec HHPT, HHOT, HHST, offered choice he has no preference.  Sent referral thru portal for [facility].    Expected Discharge Plan: Home w Home Health Services Barriers to Discharge: Continued Medical Work up   Patient Goals and CMS Choice Patient states their goals for this hospitalization and ongoing recovery are:: return home CMS Medicare.gov Compare Post Acute Care list provided to:: Patient Choice offered to / list presented to : Parent      Expected Discharge Plan and Services In-house Referral: NA Discharge Planning Services: CM Consult Post Acute Care Choice: Home Health Living arrangements for the past 2 months: Single Family Home                 DME Arranged: N/A DME Agency: NA       HH Arranged: PT, OT, Speech Therapy          Prior Living Arrangements/Services Living arrangements for the past 2 months: Single Family Home Lives with:: Spouse Patient language and need for interpreter reviewed:: Yes Do you feel safe going back to the place where you live?: Yes      Need for Family Participation in Patient Care: Yes (Comment) Care giver support system in place?: Yes (comment) Current home services: DME (walker) Criminal Activity/Legal Involvement Pertinent to Current Situation/Hospitalization: No - Comment as needed  Activities of Daily Living   ADL Screening  (condition at time of admission) Independently performs ADLs?: No Does the patient have a NEW difficulty with bathing/dressing/toileting/self-feeding that is expected to last >3 days?: No Does the patient have a NEW difficulty with getting in/out of bed, walking, or climbing stairs that is expected to last >3 days?: No Does the patient have a NEW difficulty with communication that is expected to last >3 days?: No Is the patient deaf or have difficulty hearing?: No Does the patient have difficulty seeing, even when wearing glasses/contacts?: No Does the patient have difficulty concentrating, remembering, or making decisions?: No  Permission Sought/Granted Permission sought to share information with : Case Manager Permission granted to share information with : Yes, Verbal Permission Granted     Permission granted to share info w AGENCY: HH        Emotional Assessment Appearance:: Appears stated age Attitude/Demeanor/Rapport: Engaged Affect (typically observed): Appropriate Orientation: : Oriented to Self, Oriented to Place, Oriented to  Time, Oriented to Situation Alcohol  / Substance Use: Not Applicable Psych Involvement: No (comment)  Admission diagnosis:  Shortness of breath [R06.02] SOB (shortness of breath) [R06.02] Generalized weakness [R53.1] Patient Active Problem List   Diagnosis Date Noted   Acute CVA (cerebrovascular accident) ([name]) 06/29/2024   History of anxiety 06/28/2024   History of CAD (coronary artery disease) 06/28/2024   History of nocturnal hypoxia ([name]) 06/28/2024   Nocturnal hypoxia 03/03/2024   Shortness  of breath 03/02/2024   Acute on chronic diastolic CHF (congestive heart failure) ([name]) 02/10/2024   Obesity, class 1 02/10/2024   SOB (shortness of breath) 09/15/2023   BPH (benign prostatic hyperplasia) 02/27/2023   History of anemia due to chronic kidney disease 02/27/2023   COVID-19 02/27/2023   COVID-19 virus infection 02/26/2023   Acute kidney  injury superimposed on chronic kidney disease 02/03/2023   Transient hypotension 02/03/2023   SIRS (systemic inflammatory response syndrome) ([name]) 02/03/2023   Dementia without behavioral disturbance ([name]) 02/03/2023   Fall at home, initial encounter 02/03/2023   Acute respiratory failure with hypoxia ([name]) 02/03/2023   Hypokalemia 02/03/2023   Prolonged QT interval 02/03/2023   Left ankle pain 01/08/2023   Hematuria 01/08/2023   Fracture of femoral neck, right, closed ([name]) 12/24/2022   Depression with anxiety 12/24/2022   Acute metabolic encephalopathy 09/18/2022   Acute encephalopathy 09/17/2022   AKI (acute kidney injury) 09/04/2022   Generalized weakness 08/12/2022   History of seronegative myasthenia gravis ([name]) 04/21/2022   CKD stage 3b, GFR 30-44 ml/min ([name]) 03/21/2022   Posterior vitreous detachment of both eyes 01/23/2022   Constipation 11/13/2021   Insulin  dependent type 2 diabetes mellitus ([name]) 11/13/2021   Severe major depression, single episode, without psychotic features ([name]) 11/13/2021   Amaurosis fugax 11/13/2021   Amnesia 11/13/2021   Anxiety 11/13/2021   Cardiac arrhythmia 11/13/2021   Hearing loss 11/13/2021   Hypercoagulable state 11/13/2021   Hyperparathyroidism due to renal insufficiency 11/13/2021   Moderate aortic stenosis 11/05/2021   Paroxysmal atrial fibrillation ([name]) 11/05/2021   Nasal septal deviation 08/30/2021   Anticoagulated by anticoagulation treatment 07/16/2021   Syncope 05/10/2021   Gastro-esophageal reflux disease without esophagitis 07/25/2020   Globus pharyngeus 07/25/2020   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) 12/21/2019   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   (HFpEF) heart failure with preserved ejection fraction ([name]) 11/18/2019   Obstructive sleep apnea 03/07/2019   Peripheral artery disease    CKD (chronic kidney disease), stage IV ([name]) 11/29/2018   Intractable vascular  headache 11/11/2018   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 AT [address] OF [facility] [address] [place] [address] Phone: [phone] Fax: [phone]  [facility] [address] [name] [address] Phone: [phone] Fax: [phone]  [id number] [place], [name] - [address] AT [facility] [address] [name] [address] Phone: [phone] Fax: [phone]     Social Drivers of Health (SDOH) Social History: SDOH Screenings   Food Insecurity: No Food Insecurity (06/29/2024)  Housing: Low Risk  (06/29/2024)  Transportation Needs: No Transportation Needs (06/29/2024)  Utilities: Not At Risk (06/29/2024)  Depression (PHQ2-9): Low Risk  (05/20/2019)  Social Connections: Moderately Isolated (06/29/2024)  Tobacco Use: Low Risk  (06/28/2024)   SDOH Interventions:     Readmission Risk Interventions    06/30/2024   10:52 AM 02/10/2024    3:26 PM  Readmission Risk Prevention Plan  Transportation Screening Complete Complete  PCP or  Specialist Appt within 3-5 Days  Complete  HRI or Home Care Consult  Complete  Palliative Care Screening  Not Applicable  Medication Review (RN Care Manager) Complete Complete  HRI or Home Care Consult Complete   Palliative Care Screening Not Applicable   Skilled Nursing Facility Not Applicable

## 2024-07-01 DIAGNOSIS — I480 Paroxysmal atrial fibrillation: Secondary | ICD-10-CM | POA: Diagnosis not present

## 2024-07-01 DIAGNOSIS — G4733 Obstructive sleep apnea (adult) (pediatric): Secondary | ICD-10-CM | POA: Diagnosis not present

## 2024-07-01 DIAGNOSIS — G7000 Myasthenia gravis without (acute) exacerbation: Secondary | ICD-10-CM | POA: Diagnosis not present

## 2024-07-01 DIAGNOSIS — E119 Type 2 diabetes mellitus without complications: Secondary | ICD-10-CM | POA: Diagnosis not present

## 2024-07-01 LAB — GLUCOSE, CAPILLARY: Glucose-Capillary: 249 mg/dL — ABNORMAL HIGH (ref 70–99)

## 2024-07-01 MED ORDER — AMLODIPINE BESYLATE 5 MG PO TABS
5.0000 mg | ORAL_TABLET | Freq: Every day | ORAL | Status: DC
  Administered 2024-07-01: 5 mg via ORAL
  Filled 2024-07-01: qty 1

## 2024-07-01 MED ORDER — INSULIN GLARGINE-YFGN 100 UNIT/ML ~~LOC~~ SOLN
40.0000 [IU] | Freq: Every day | SUBCUTANEOUS | Status: DC
  Filled 2024-07-01: qty 0.4

## 2024-07-01 MED ORDER — APIXABAN 2.5 MG PO TABS: 2.5000 mg | ORAL_TABLET | Freq: Two times a day (BID) | ORAL | 0 refills | Status: AC

## 2024-07-01 MED ORDER — QUETIAPINE FUMARATE 25 MG PO TABS: 25.0000 mg | ORAL_TABLET | Freq: Every day | ORAL | 0 refills | Status: AC

## 2024-07-01 MED ORDER — ASPIRIN 81 MG PO TBEC: 81.0000 mg | DELAYED_RELEASE_TABLET | Freq: Every day | ORAL | Status: DC

## 2024-07-01 MED ORDER — AMLODIPINE BESYLATE 5 MG PO TABS: 5.0000 mg | ORAL_TABLET | Freq: Every day | ORAL | 0 refills | Status: DC

## 2024-07-01 NOTE — Progress Notes (Signed)
 07/01/2024 11:06 AM -----------------------------------------------------------[facility]--------------------------------------------------- D(Data) A(Action) R(response)     Data: Discharge Readiness Assessment EDD today 07/01/2024    Action: Chart reviewed   Barriers to discharge: Awaiting [facility] transport home at this time. ICM following.   Dustyn Dansereau, RN [employer]

## 2024-07-01 NOTE — TOC Transition Note (Addendum)
 Transition of Care [facility]) - Discharge Note   Patient Details  Name: Fernando Hess MRN: [id number] Date of Birth: 25-Jan-1946  [facility]) CM/SW Contact:  Tom-Johnson, Harvest Muskrat, RN Phone Number: 07/01/2024, 9:04 AM   Clinical Narrative:     Patient is scheduled for discharge today.  Readmission Risk Assessment done. Home health info, outpatient f/u, hospital f/u and discharge instructions on AVS. Wife, Cy to transport at discharge.  No further ICM needs noted.  10:30- CM informed by patient's wife, Cy that patient is to be transported via PT[facility] d/t his Myasthenia Gravis. PT[facility] scheduled to transport at discharge.  No further TOC needs.      Final next level of care: Home w Home Health Services Barriers to Discharge: Barriers Resolved   Patient Goals and CMS Choice Patient states their goals for this hospitalization and ongoing recovery are:: To return home CMS Medicare.gov Compare Post Acute Care list provided to:: Patient Choice offered to / list presented to : Parent      Discharge Placement                Patient to be transferred to facility by: Wife Name of family member notified: Cy    Discharge Plan and Services Additional resources added to the After Visit Summary for   In-house Referral: NA Discharge Planning Services: CM Consult Post Acute Care Choice: Home Health          DME Arranged: N/A DME Agency: NA       HH Arranged: PT, OT, Speech Therapy          Social Drivers of Health (SDOH) Interventions SDOH Screenings   Food Insecurity: No Food Insecurity (06/29/2024)  Housing: Low Risk  (06/29/2024)  Transportation Needs: No Transportation Needs (06/29/2024)  Utilities: Not At Risk (06/29/2024)  Depression (PHQ2-9): Low Risk  (05/20/2019)  Social Connections: Moderately Isolated (06/29/2024)  Tobacco Use: Low Risk  (06/28/2024)     Readmission Risk Interventions    06/30/2024   10:52 AM 02/10/2024    3:26 PM   Readmission Risk Prevention Plan  Transportation Screening Complete Complete  PCP or Specialist Appt within 3-5 Days  Complete  HRI or Home Care Consult  Complete  Palliative Care Screening  Not Applicable  Medication Review (RN Care Manager) Complete Complete  HRI or Home Care Consult Complete   Palliative Care Screening Not Applicable   Skilled Nursing Facility Not Applicable

## 2024-07-01 NOTE — Progress Notes (Signed)
 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 Currently pt is Supervision for bed mobility with minimal use of bed rail, CGA for sit to stand with verbal cues, gait for 120 ft with RW and 2 steps with bil rails. Pt spouse on phone and concerned about pt mobility and pt not with O2 over night which is what he has home. Due to pt current functional status, home set up and available assistance at home recommending skilled physical therapy services 3x/week in order to address strength, balance and functional mobility to decrease risk for falls, injury and re-hospitalization.       If plan is discharge home, recommend the following: A little help with walking and/or transfers;A little help with bathing/dressing/bathroom;Assistance with cooking/housework;Help with stairs or ramp for entrance     Equipment Recommendations  None recommended by PT       Precautions / Restrictions Precautions Precautions: Fall Recall of Precautions/Restrictions: Intact Restrictions Weight Bearing Restrictions Per Provider Order: No     Mobility  Bed Mobility Overal bed mobility: Needs Assistance Bed Mobility: Supine to Sit     Supine to sit: Min assist, HOB elevated, Used rails Sit to supine: Supervision   General bed mobility comments: assist to lift trunk with handheld assist    Transfers Overall  transfer level: Needs assistance Equipment used: Rolling walker (2 wheels) Transfers: Sit to/from Stand Sit to Stand: Contact guard assist           General transfer comment: pt pulling on RW to stand requiring verbal cues with significant improvement with pushing up from surfaces. Good re-call when standing up from toilet.    Ambulation/Gait Ambulation/Gait assistance: Contact guard assist Gait Distance (Feet): 120 Feet Assistive device: Rolling walker (2 wheels) Gait Pattern/deviations: Step-through pattern, Decreased step length - right, Decreased step length - left Gait velocity: slowed Gait velocity interpretation: <1.31 ft/sec, indicative of household ambulator   General Gait Details: Slow, short steps; decr L foot clearance, but no overt toe catch L foot; fatigued at the end of walk; no reports of difficulty breathing   Stairs Stairs: Yes Stairs assistance: Contact guard assist Stair Management: Two rails, Step to pattern, Forwards, Backwards Number of Stairs: 2 General stair comments: pt has chair lift at home but has to step up one step to get into chair lift. Pt did this at [facility]. forwards ascending, backwards descending.     Balance Overall balance assessment: Needs assistance Sitting-balance support: No upper extremity supported, Feet supported Sitting balance-Leahy Scale: Good     Standing balance support: Bilateral upper extremity supported, During functional activity, Reliant on assistive device for balance Standing balance-Leahy Scale: Fair Standing balance comment: CGA for safety no overt LOB          Communication Communication Communication: Other (comment) Factors Affecting Communication: Reduced clarity of speech  Cognition Arousal: Alert Behavior During Therapy: WFL for tasks assessed/performed   PT - Cognitive impairments: No apparent impairments, Difficult to assess Difficult to assess due to: Impaired communication  PT - Cognition  Comments: Noteworthy that pt chose not to sit down in chair without armrests, citing anticipated difficulty standing up without armrests to push off Following commands: Intact      Cueing Cueing Techniques: Verbal cues, Gestural cues, Tactile cues     General Comments General comments (skin integrity, edema, etc.): O2 sats steady throughout though pt very dyspneaic with activity.      Pertinent Vitals/Pain Pain Assessment Pain Assessment: No/denies pain     PT Goals (current goals can now be found in the care plan section) Acute Rehab PT Goals Patient Stated Goal: Hopes to get home soon PT Goal Formulation: With patient Time For Goal Achievement: 07/13/24 Potential to Achieve Goals: Good Progress towards PT goals: Progressing toward goals    Frequency    Min 4X/week      PT Plan  Continue with current POC        AM-PAC PT 6 Clicks Mobility   Outcome Measure  Help needed turning from your back to your side while in a flat bed without using bedrails?: None Help needed moving from lying on your back to sitting on the side of a flat bed without using bedrails?: None Help needed moving to and from a bed to a chair (including a wheelchair)?: A Little Help needed standing up from a chair using your arms (e.g., wheelchair or bedside chair)?: A Little Help needed to walk in hospital room?: A Little Help needed climbing 3-5 steps with a railing? : A Little 6 Click Score: 20    End of Session Equipment Utilized During Treatment: Gait belt Activity Tolerance: Patient tolerated treatment well Patient left: with call bell/phone within reach;in bed;with bed alarm set Nurse Communication: Mobility status PT Visit Diagnosis: Unsteadiness on feet (R26.81);Other abnormalities of gait and mobility (R26.89);Hemiplegia and hemiparesis Hemiplegia - Right/Left: Left Hemiplegia - dominant/non-dominant: Non-dominant Hemiplegia - caused by: Cerebral infarction     Time:  1031-1100 PT Time Calculation (min) (ACUTE ONLY): 29 min  Charges:    $Gait Training: 8-22 mins $Therapeutic Activity: 8-22 mins PT General Charges $$ ACUTE PT VISIT: 1 Visit                     Dorothyann Maier, DPT, CLT  Acute Rehabilitation Services Office: [phone] (Secure chat preferred)    Dorothyann VEAR Maier 07/01/2024, 11:02 AM

## 2024-07-01 NOTE — Progress Notes (Signed)
 FVC: 1.51 L NIF: Greater than -40  With excellent patient effort.

## 2024-07-01 NOTE — Care Management Important Message (Signed)
 Important Message  Patient Details  Name: Fernando Hess MRN: [id number] Date of Birth: 1945/12/27   Important Message Given:  Yes - Medicare IM     Claretta Deed 07/01/2024, 3:44 PM

## 2024-07-01 NOTE — Discharge Summary (Signed)
 Physician Discharge Summary  TAYQUAN GASSMAN FMW:[id number] DOB: 1946-01-14 DOA: 06/28/2024  PCP: Okey Carlin Redbird, MD  Admit date: 06/28/2024 Discharge date: 07/01/24  Admitted From: Home Disposition: Home Recommendations for Outpatient Follow-up:  Outpatient follow-up with PCP in 1 to 2 weeks Outpatient follow-up with primary neurologist Check blood pressure, CMP and CBC at follow-up Please follow up on the following pending results: None  Home Health: [facility] PT/OT Equipment/Devices: No new need identified  Discharge Condition: Stable CODE STATUS: Full code    Contact information for follow-up providers     Euna Read, MD. Schedule an appointment as soon as possible for a visit in 1 month(s).   Specialty: Specialist Contact information: [address] [name] [address] [phone]         Okey Carlin Redbird, MD. Schedule an appointment as soon as possible for a visit in 1 week(s).   Specialty: Family Medicine Contact information: [address] [name] [address] [phone]              Contact information for after-discharge care     Home Medical Care     [facility]) .   Service: Home Health Services Why: Agency will call you to set up apt times Contact information: [address] [place]  [phone]                     Hospital course 78 year old M with PMH of major cognitive impairment, HFpEF, DM-2, CAD, paroxysmal A-fib on Eliquis , CKD-3B, OSA not on CPAP, anxiety, depression, seronegative myasthenia gravis and idiopathic progressive neuropathy followed by neurology outpatient presenting with shortness of breath and generalized weakness starting the morning of presentation. In ED, SBP elevated to 200s.  Initially received Nitropaste and started on nitro drip.  There was some concern about myasthenia gravis exacerbation and neurology  was consulted.  However, MRI brain showed acute subtotal right paramedian pontine infarct, and complete CVA workup initiated per recommendation by neurology.    MRA head with moderate to severe stenosis of petrous right ICA and diffuse irregularity and stenosis of bilateral vertebral arteries.  Carotid Doppler with 1 to 39% stenosis in right ICA and 50 to 59% stenosis in left ICA.  TTE without significant finding.  LDL 123.  A1c 8.8%.   Patient is continued on low-dose aspirin .  Eliquis  reduced to 2.5 mg twice daily given 80 and renal function.  Neurology arranging Leqvio given contraindication for statin in the setting of myasthenia gravis.  In regards to shortness of breath, no clear etiology identified but his symptoms resolved quickly.    Therapy recommended home health.  See individual problem list below for more.   Problems addressed during this hospitalization Shortness of breath/respiratory distress: No desaturation but initially started on BiPAP.  Serial troponin, BNP within normal.  A 20 pathogen RVP nonreactive.  Initial and repeat CXR without acute finding.  SBP was elevated to 200s.  Initially started on nitro drip that was later discontinued due to stroke.  Has history of OSA.  VBG reassuring.  History of possible seronegative myasthenia gravis and idiopathic progressive neuropathy.  NIF -36.   Acute ischemic paramedian pontine CVA: Bleed due to small vessel disease.  However, he carries history of CVA.  SBP elevated to 200s.  CVA workup as above.  Blood pressure improved. -Neurology on board-recommended continuing aspirin , resuming Eliquis  -Per neuro, not a candidate for statin due to  myasthenia gravis.  Neurology arranging Leqvio. -Neurology recommends avoiding hypotension. -Therapy recommended home health.   Hypertensive urgency: BP elevated but improved.  Gradually normalize BP - Decreased amlodipine . -P.o. hydralazine  as needed   History of possible seronegative  myasthenia gravis?  Followed by neurology outpatient.  Previously failed treatment by Mestinon and IVIG.  Supposed to be followed up with neuromuscular speciality.  Unclear if he pursued this. -Outpatient follow-up   Idiopathic progressive neuropathy: On vitamin B12 outpatient.  Vitamin B12 level elevated. -Outpatient follow-up with primary neurologist   IDDM-2 with hyperglycemia: A1c 8.8%.  CBG elevated.  -Continue home meds. -Reassess glycemic control and adjust meds as appropriate   Paroxysmal A-fib/RBBB/LAFB: Rate controlled irregular rhythm but not sinus.  On Eliquis  for anticoagulation. - Reduced Eliquis  to 2.5 mg twice daily given his age and renal function   History of Vitamin B12 deficiency: B12 elevated to 2000's.   Obstructive sleep apnea not on CPAP.  VBG reassuring.   History of anxiety and depression stable -Decrease Seroquel  from 50 to 25 mg daily -Continue home Lamictal .   History of CAD: No chest pain.  Serial troponin negative.  CT without significant finding -Antiplatelet as above   CKD stage IV: Baseline Cr ranges from 1.7-2.3.  Stable. -Avoid nephrotoxic meds   Dementia?  Awake and alert and oriented x 4. -Continue home Aricept . -Reorientation and delirium precaution   BPH with urinary incontinence -Continue Flomax  -Has upcoming appointment with urology.   History of glucoma -Continue latanoprost   Leukocytosis: Mild.  UA, RVP, CXR and blood culture unrevealing.  Afebrile. - Recheck CBC at follow-up   Class I obesity Body mass index is 30.21 kg/m.           Consultations: Neurology  Time spent 35  minutes  Vital signs Vitals:   06/30/24 1938 06/30/24 2032 07/01/24 0443 07/01/24 0807  BP: (!) 155/68 136/69 138/74 117/71  Pulse: 72 72 74 87  Temp: 98.1 F (36.7 C) 98.2 F (36.8 C) 99 F (37.2 C) 98.6 F (37 C)  Resp: 18 18 19 18   Height:      Weight:      SpO2: 97% 95% 96% 94%  TempSrc: Oral     BMI (Calculated):          Discharge exam  GENERAL: No apparent distress.  Nontoxic. HEENT: MMM.  Vision and hearing grossly intact.  NECK: Supple.  No apparent JVD.  RESP:  No IWOB.  Fair aeration bilaterally. CVS:  RRR. Heart sounds normal.  ABD/GI/GU: BS+. Abd soft, NTND.  MSK/EXT:  Moves extremities. No apparent deformity. No edema.  SKIN: no apparent skin lesion or wound NEURO: Awake and alert. Oriented appropriately.  No apparent focal neuro deficit. PSYCH: Calm. Normal affect.   Discharge Instructions Discharge Instructions     AMB Referral to [facility] Pharm-D   Complete by: As directed    Reason For Referral: Lipids   Discharge instructions   Complete by: As directed    It has been a pleasure taking care of you!  You were hospitalized due to shortness of breath and acute stroke.  Unclear what caused your shortness of breath.  Your MRI showed a stroke.  You have been started on medication to reduce your risk of further stroke.  Please review your new medication list and the directions on your medications before you take them. Follow-up with your primary care doctor in 1 to 2 weeks or sooner if needed. Follow-up with urology as previously planned Follow-up  with neurology per their recommendation.   Take care,   Increase activity slowly   Complete by: As directed       Allergies as of 07/01/2024       Reactions   Cilostazol  Swelling, Other (See Comments)   Edema   Magnesium -containing Compounds Other (See Comments)   Avoid magnesium  with hx myasthenia gravis (can worsen muscle weakness and trigger a severe exacerbation)   Dulaglutide Nausea And Vomiting, Other (See Comments)   TRULICITY   Levofloxacin Hives, Itching, Rash   Liraglutide Other (See Comments)   Severe fatigue & insomnia   Lisinopril Itching, Rash, Cough        Medication List     STOP taking these medications    Gemtesa 75 MG Tabs Generic drug: Vibegron       TAKE these medications    amLODipine  5 MG  tablet Commonly known as: NORVASC  Take 1 tablet (5 mg total) by mouth daily.   apixaban  2.5 MG Tabs tablet Commonly known as: ELIQUIS  Take 1 tablet (2.5 mg total) by mouth 2 (two) times daily. What changed:  medication strength how much to take   aspirin  EC 81 MG tablet Take 1 tablet (81 mg total) by mouth daily. Swallow whole.   calcium  carbonate 500 MG chewable tablet Commonly known as: Tums Chew 1 tablet (200 mg of elemental calcium  total) by mouth 2 (two) times daily.   cyanocobalamin  1000 MCG/ML injection Commonly known as: VITAMIN B12 Inject 1,000 mcg into the muscle once a week. Inject on Saturday   donepezil  10 MG tablet Commonly known as: ARICEPT  Take 10 mg by mouth at bedtime.   FreeStyle Libre 3 Plus Sensor Misc CHANGE EVERY 15 DAYS AS RECOMMENDED BY THE MANUFACTURER 90 DAYS   FreeStyle Libre 3 Reader Espiridion USE TO TEST BLOOD SUGAR   furosemide  40 MG tablet Commonly known as: LASIX  Take 1 tablet (40 mg total) by mouth 3 (three) times a week. Take one tablet (40mg ) by mouth three times weekly on Monday, Wednesday, and Friday.   hydrALAZINE  25 MG tablet Commonly known as: APRESOLINE  Take 1 tablet (25 mg total) by mouth daily as needed (Hypertension). Take one tablet (25mg ) by mouth daily as needed if SBP is >160   insulin  lispro 100 UNIT/ML KwikPen Commonly known as: HUMALOG  Inject 8 Units into the skin 3 (three) times daily before meals.   lamoTRIgine  25 MG tablet Commonly known as: LAMICTAL  Take 25 mg by mouth daily.   Lantus  SoloStar 100 UNIT/ML Solostar Pen Generic drug: insulin  glargine Inject 36 Units into the skin daily before breakfast.   latanoprost 0.005 % ophthalmic solution Commonly known as: XALATAN Place 1 drop into both eyes at bedtime.   Myrbetriq  50 MG Tb24 tablet Generic drug: mirabegron  ER Take 50 mg by mouth at bedtime.   ondansetron  4 MG disintegrating tablet Commonly known as: ZOFRAN -ODT Take 1 tablet (4 mg total) by mouth  every 8 (eight) hours as needed.   pantoprazole  40 MG tablet Commonly known as: PROTONIX  TAKE 1 TABLET BY MOUTH EVERY DAY   QUEtiapine  25 MG tablet Commonly known as: SEROQUEL  Take 1 tablet (25 mg total) by mouth at bedtime. What changed:  medication strength how much to take   tamsulosin  0.4 MG Caps capsule Commonly known as: FLOMAX  Take 0.4 mg by mouth daily.   Tradjenta  5 MG Tabs tablet Generic drug: linagliptin  Take 5 mg by mouth daily.         Procedures/Studies:    DG Chest  2 View Result Date: 06/30/2024 CLINICAL DATA:  Shortness of breath. EXAM: CHEST - 2 VIEW COMPARISON:  06/28/2024 FINDINGS: The heart size and mediastinal contours are within normal limits. Prior CABG again noted. Low lung volumes are again seen. No No evidence of focal consolidation or pleural effusion. The visualized skeletal structures are unremarkable. IMPRESSION: Low lung volumes. No active cardiopulmonary disease. Electronically Signed   By: Norleen DELENA Kil M.D.   On: 06/30/2024 09:10   ECHOCARDIOGRAM COMPLETE Result Date: 06/29/2024    ECHOCARDIOGRAM REPORT   Patient Name:   BENJAMAN ARTMAN Date of Exam: 06/29/2024 Medical Rec #:  [id number]      Height:       66.0 in Accession #:    [id number]     Weight:       190.0 lb Date of Birth:  1946-06-11      BSA:          1.957 m Patient Age:    78 years       BP:           169/85 mmHg Patient Gender: M              HR:           70 bpm. Exam Location:  Inpatient Procedure: 2D Echo, Cardiac Doppler, Color Doppler and Intracardiac            Opacification Agent (Both Spectral and Color Flow Doppler were            utilized during procedure). Indications:    Stroke  History:        Patient has prior history of Echocardiogram examinations, most                 recent 02/10/2024. CAD, Abnormal ECG and Prior CABG, Aortic Valve                 Disease, Arrythmias:Atrial Fibrillation, Signs/Symptoms:Dyspnea,                 Shortness of Breath, Altered Mental Status,  Alzheimer's and                 Syncope; Risk Factors:Sleep Apnea.  Sonographer:    Ellouise Mose RDCS Referring Phys: MICAELA SPEAKER  Sonographer Comments: Technically difficult study due to poor echo windows, suboptimal apical window and suboptimal subcostal window. IMPRESSIONS  1. Left ventricular ejection fraction, by estimation, is 70 to 75%. The left ventricle has hyperdynamic function. The left ventricle has no regional wall motion abnormalities. There is mild left ventricular hypertrophy of the basal-septal segment. Left ventricular diastolic parameters are consistent with Grade I diastolic dysfunction (impaired relaxation).  2. Right ventricular systolic function is normal. The right ventricular size is normal. Tricuspid regurgitation signal is inadequate for assessing PA pressure.  3. The mitral valve is normal in structure. Trivial mitral valve regurgitation. No evidence of mitral stenosis.  4. The aortic valve is calcified. Aortic valve regurgitation is mild. Mild aortic valve stenosis.  5. The inferior vena cava is normal in size with greater than 50% respiratory variability, suggesting right atrial pressure of 3 mmHg. FINDINGS  Left Ventricle: Left ventricular ejection fraction, by estimation, is 70 to 75%. The left ventricle has hyperdynamic function. The left ventricle has no regional wall motion abnormalities. Definity  contrast agent was given IV to delineate the left ventricular endocardial borders. The left ventricular internal cavity size was normal in size. There is mild left ventricular hypertrophy of the basal-septal segment.  Left ventricular diastolic parameters are consistent with Grade I diastolic dysfunction  (impaired relaxation). Right Ventricle: The right ventricular size is normal. Right ventricular systolic function is normal. Tricuspid regurgitation signal is inadequate for assessing PA pressure. Left Atrium: Left atrial size was normal in size. Right Atrium: Right atrial size was normal  in size. Pericardium: There is no evidence of pericardial effusion. Mitral Valve: The mitral valve is normal in structure. Mild mitral annular calcification. Trivial mitral valve regurgitation. No evidence of mitral valve stenosis. Tricuspid Valve: The tricuspid valve is normal in structure. Tricuspid valve regurgitation is trivial. No evidence of tricuspid stenosis. Aortic Valve: The aortic valve is calcified. Aortic valve regurgitation is mild. Mild aortic stenosis is present. Aortic valve mean gradient measures 7.5 mmHg. Aortic valve peak gradient measures 14.3 mmHg. Aortic valve area, by VTI measures 1.61 cm. Pulmonic Valve: The pulmonic valve was normal in structure. Pulmonic valve regurgitation is not visualized. No evidence of pulmonic stenosis. Aorta: The aortic root is normal in size and structure. Venous: The inferior vena cava is normal in size with greater than 50% respiratory variability, suggesting right atrial pressure of 3 mmHg. IAS/Shunts: No atrial level shunt detected by color flow Doppler.  LEFT VENTRICLE PLAX 2D LVIDd:         4.50 cm     Diastology LVIDs:         3.20 cm     LV e' medial:    5.33 cm/s LV PW:         1.10 cm     LV E/e' medial:  13.9 LV IVS:        1.30 cm     LV e' lateral:   7.83 cm/s LVOT diam:     2.35 cm     LV E/e' lateral: 9.4 LV SV:         55 LV SV Index:   28 LVOT Area:     4.34 cm  LV Volumes (MOD) LV vol d, MOD A2C: 56.9 ml LV vol d, MOD A4C: 80.6 ml LV vol s, MOD A2C: 30.6 ml LV vol s, MOD A4C: 25.8 ml LV SV MOD A2C:     26.3 ml LV SV MOD A4C:     80.6 ml LV SV MOD BP:      46.5 ml RIGHT VENTRICLE            IVC RV S prime:     5.44 cm/s  IVC diam: 1.60 cm TAPSE (M-mode): 1.1 cm LEFT ATRIUM             Index        RIGHT ATRIUM           Index LA diam:        4.00 cm 2.04 cm/m   RA Area:     11.10 cm LA Vol (A2C):   52.5 ml 26.83 ml/m  RA Volume:   19.90 ml  10.17 ml/m LA Vol (A4C):   45.7 ml 23.36 ml/m LA Biplane Vol: 49.3 ml 25.20 ml/m  AORTIC VALVE AV  Area (Vmax):    1.80 cm AV Area (Vmean):   1.75 cm AV Area (VTI):     1.61 cm AV Vmax:           189.25 cm/s AV Vmean:          124.500 cm/s AV VTI:            0.340 m AV Peak Grad:  14.3 mmHg AV Mean Grad:      7.5 mmHg LVOT Vmax:         78.40 cm/s LVOT Vmean:        50.100 cm/s LVOT VTI:          0.126 m LVOT/AV VTI ratio: 0.37  AORTA Ao Root diam: 3.80 cm Ao Asc diam:  3.80 cm MITRAL VALVE MV Area (PHT): 3.59 cm     SHUNTS MV Decel Time: 212 msec     Systemic VTI:  0.13 m MV E velocity: 73.90 cm/s   Systemic Diam: 2.35 cm MV A velocity: 109.00 cm/s MV E/A ratio:  0.68 Redell Shallow MD Electronically signed by Redell Shallow MD Signature Date/Time: 06/29/2024/2:07:58 PM    Final    VAS US  CAROTID Result Date: 06/29/2024 Carotid Arterial Duplex Study Patient Name:  YIGIT NORKUS Burkley  Date of Exam:   06/29/2024 Medical Rec #: [id number]       Accession #:    [id number] Date of Birth: 1946-03-08       Patient Gender: M Patient Age:   26 years Exam Location:  [facility] Procedure:      VAS US  CAROTID Referring Phys: ELIGIO LAV --------------------------------------------------------------------------------  Indications:      CVA and Carotid artery disease. Risk Factors:     Hypertension, hyperlipidemia, Diabetes, coronary artery                   disease. Comparison Study: 06/21/20 - bilateral 1-39% stenosis ICAs Performing Technologist: Ricka Sturdivant-Jones RDMS, RVT  Examination Guidelines: A complete evaluation includes B-mode imaging, spectral Doppler, color Doppler, and power Doppler as needed of all accessible portions of each vessel. Bilateral testing is considered an integral part of a complete examination. Limited examinations for reoccurring indications may be performed as noted.  Right Carotid Findings: +----------+--------+--------+--------+------------------+------------------+           PSV cm/sEDV cm/sStenosisPlaque DescriptionComments            +----------+--------+--------+--------+------------------+------------------+ CCA Prox  62      15                                intimal thickening +----------+--------+--------+--------+------------------+------------------+ CCA Mid                                             intimal thickening +----------+--------+--------+--------+------------------+------------------+ CCA Distal57      15                                                   +----------+--------+--------+--------+------------------+------------------+ ICA Prox  57      19      1-39%   heterogenous                         +----------+--------+--------+--------+------------------+------------------+ ICA Distal55      21                                                   +----------+--------+--------+--------+------------------+------------------+ ECA  187     21                                                   +----------+--------+--------+--------+------------------+------------------+ +----------+--------+-------+----------------+-------------------+           PSV cm/sEDV cmsDescribe        Arm Pressure (mmHG) +----------+--------+-------+----------------+-------------------+ Subclavian210            Multiphasic, WNL                    +----------+--------+-------+----------------+-------------------+ +---------+--------+--+--------+--+---------+ VertebralPSV cm/s84EDV cm/s24Antegrade +---------+--------+--+--------+--+---------+  Left Carotid Findings: +----------+--------+--------+--------+------------------+---------------------+           PSV cm/sEDV cm/sStenosisPlaque DescriptionComments              +----------+--------+--------+--------+------------------+---------------------+ CCA Prox  83      18                                                      +----------+--------+--------+--------+------------------+---------------------+ CCA Mid                                              intimal thickening    +----------+--------+--------+--------+------------------+---------------------+ CCA Distal61      15                                intimal thickening    +----------+--------+--------+--------+------------------+---------------------+ ICA Prox  58      19      1-39%   heterogenous                            +----------+--------+--------+--------+------------------+---------------------+ ICA Mid   119     42      40-59%                    Velocities suggest                                                        lower end of scale    +----------+--------+--------+--------+------------------+---------------------+ ICA Distal41      12                                tortuous              +----------+--------+--------+--------+------------------+---------------------+ ECA       171     24                                                      +----------+--------+--------+--------+------------------+---------------------+ +----------+--------+--------+----------------+-------------------+           PSV cm/sEDV cm/sDescribe  Arm Pressure (mmHG) +----------+--------+--------+----------------+-------------------+ [id number]             Multiphasic, WNL                    +----------+--------+--------+----------------+-------------------+ +---------+--------+--+--------+--+---------+ VertebralPSV cm/s56EDV cm/s15Antegrade +---------+--------+--+--------+--+---------+   Summary: Right Carotid: Velocities in the right ICA are consistent with a 1-39% stenosis. Left Carotid: Velocities in the left ICA are consistent with a 40-59% stenosis.               Velocities closer to 40% stenosis. Vertebrals:  Bilateral vertebral arteries demonstrate antegrade flow. Subclavians: Normal flow hemodynamics were seen in bilateral subclavian              arteries. *See table(s) above for measurements and observations.      Preliminary    MR ANGIO HEAD WO CONTRAST Result Date: 06/29/2024 EXAM: MR Angiography Head without intravenous Contrast. 06/29/2024 05:07:25 AM TECHNIQUE: Magnetic resonance angiography images of the head without intravenous contrast. Multiplanar 2D and 3D reformatted images are provided for review. COMPARISON: MRI of the head dated 06/29/2024. CLINICAL HISTORY: Acute CVA. FINDINGS: ANTERIOR CIRCULATION: Moderate-to-severe stenosis of the horizontal petrous segment of the right internal carotid artery. Mild-to-moderate stenosis of the ophthalmic segments of the internal carotid arteries bilaterally. The right A1 segment is relatively hypoplastic. The anterior and middle cerebral arteries demonstrate no apparent hemodynamically significant stenosis. No aneurysm. POSTERIOR CIRCULATION: Fetal type origins of the posterior cerebral arteries. No significant stenosis of the posterior cerebral arteries. The basilar artery is somewhat hypoplastic secondary to the fetal type origins of the posterior cerebral arteries. It is also irregular in caliber with multifocal mild-to-moderate stenosis. There is diffuse irregularity and stenosis of the vertebral arteries bilaterally. The left vertebral artery is hypoplastic and appears to terminate in the posterior inferior cerebellar artery. No aneurysm. IMPRESSION: 1. Moderate-to-severe stenosis of the horizontal petrous segment of the right internal carotid artery. 2. Mild-to-moderate stenosis of the ophthalmic segments of the internal carotid arteries bilaterally. 3. Diffuse irregularity and stenosis of the vertebral arteries bilaterally, with the left vertebral artery hypoplastic and terminating in the posterior inferior cerebellar artery. 4. 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 Electronically signed by: Evalene Coho MD 06/29/2024 05:18 AM EDT RP Workstation: [id number]    MR BRAIN WO CONTRAST Result Date: 06/29/2024 EXAM: MRI BRAIN WITHOUT CONTRAST 06/29/2024 12:57:00 AM TECHNIQUE: Multiplanar multisequence MRI of the head/brain was performed without the administration of intravenous contrast. COMPARISON: Comparison made with prior CT from 07/15/2023. CLINICAL HISTORY: FINDINGS: BRAIN AND VENTRICLES: Examination degraded by motion artifact. Subtle small focus of restricted diffusion seen involving the right paramedian midbrain, suspicious for a tiny acute ischemic nonhemorrhagic infarct (series 5, image [id number]). No associated hemorrhage or mass effect. Mild age related cerebral atrophy. Patchy T2 x FLAIR hyperintensities involving the periventricular and deep white matter of the cerebral hemispheres, consistent with chronic small vessel ischemic disease, mild to moderate in nature. Few small remote right cerebellar infarcts. Chronic lacunar infarct present at the right basal ganglia. No intracranial hemorrhage. No mass. No midline shift. No hydrocephalus. The sella is unremarkable. Normal flow voids. ORBITS: Prior bilateral ocular lens replacement. SINUSES AND MASTOIDS: Scattered mucosal thickening present about the ethmoid air cells and maxillary sinuses. BONES AND SOFT TISSUES: Normal marrow signal. No acute soft tissue abnormality. IMPRESSION: 1. Subtle small focus of restricted diffusion involving the right paramedian midbrain, suspicious for a small acute ischemic nonhemorrhagic infarct. 2. Underlying age-related atrophy  with mild to moderate chronic microvascular ischemic disease. Small remote lacunar infarcts at the right basal ganglia and right cerebellum. Electronically signed by: Morene Hoard MD 06/29/2024 01:43 AM EDT RP Workstation: [id number]   DG Chest Port 1 View Result Date: 06/28/2024 EXAM: 1 VIEW XRAY OF THE CHEST 06/28/2024 04:47:00 PM COMPARISON: Comparison 06/03/2024 status post coronary artery bypass graft. CLINICAL HISTORY: Dyspnea. FINDINGS:  LUNGS AND PLEURA: No focal pulmonary opacity. No pulmonary edema. No pleural effusion. No pneumothorax. HEART AND MEDIASTINUM: No acute abnormality of the cardiac and mediastinal silhouettes. BONES AND SOFT TISSUES: No acute osseous abnormality. IMPRESSION: 1. No acute cardiopulmonary process detected. Electronically signed by: Lynwood Seip MD 06/28/2024 04:56 PM EDT RP Workstation: [id number]   DG Chest 2 View Result Date: 06/03/2024 CLINICAL DATA:  Short of breath worsening for several days, history of atrial fibrillation and CHF EXAM: CHEST - 2 VIEW COMPARISON:  03/06/2024 FINDINGS: Frontal and lateral views of the chest demonstrate postsurgical changes from CABG. Cardiac silhouette is stable. No acute airspace disease, effusion, or pneumothorax. No acute bony abnormalities. IMPRESSION: 1. Stable chest, no acute process. Electronically Signed   By: Ozell Daring M.D.   On: 06/03/2024 18:44       The results of significant diagnostics from this hospitalization (including imaging, microbiology, ancillary and laboratory) are listed below for reference.     Microbiology: Recent Results (from the past 240 hours)  Resp panel by RT-PCR (RSV, Flu A&B, Covid) Anterior Nasal Swab     Status: None   Collection Time: 06/28/24  4:55 PM   Specimen: Anterior Nasal Swab  Result Value Ref Range Status   SARS Coronavirus 2 by RT PCR NEGATIVE NEGATIVE Final   Influenza A by PCR NEGATIVE NEGATIVE Final   Influenza B by PCR NEGATIVE NEGATIVE Final    Comment: (NOTE) The Xpert Xpress SARS-CoV-2/FLU/RSV plus assay is intended as an aid in the diagnosis of influenza from Nasopharyngeal swab specimens and should not be used as a sole basis for treatment. Nasal washings and aspirates are unacceptable for Xpert Xpress SARS-CoV-2/FLU/RSV testing.  Fact Sheet for Patients: BloggerCourse.com  Fact Sheet for Healthcare Providers: SeriousBroker.it  This test is  not yet approved or cleared by the [place]  FDA and has been authorized for detection and/or diagnosis of SARS-CoV-2 by FDA under an Emergency Use Authorization (EUA). This EUA will remain in effect (meaning this test can be used) for the duration of the COVID-19 declaration under Section 564(b)(1) of the Act, 21 U.S.C. section 360bbb-3(b)(1), unless the authorization is terminated or revoked.     Resp Syncytial Virus by PCR NEGATIVE NEGATIVE Final    Comment: (NOTE) Fact Sheet for Patients: BloggerCourse.com  Fact Sheet for Healthcare Providers: SeriousBroker.it  This test is not yet approved or cleared by the [place]  FDA and has been authorized for detection and/or diagnosis of SARS-CoV-2 by FDA under an Emergency Use Authorization (EUA). This EUA will remain in effect (meaning this test can be used) for the duration of the COVID-19 declaration under Section 564(b)(1) of the Act, 21 U.S.C. section 360bbb-3(b)(1), unless the authorization is terminated or revoked.  Performed at [facility], [address] [place], [name] [address]   Respiratory (~20 pathogens) panel by PCR     Status: None   Collection Time: 06/30/24  7:48 AM   Specimen: Nasopharyngeal Swab; Respiratory  Result Value Ref Range Status   Adenovirus NOT DETECTED NOT DETECTED Final   Coronavirus 229E NOT DETECTED NOT DETECTED Final  Comment: (NOTE) The Coronavirus on the Respiratory Panel, DOES NOT test for the novel  Coronavirus (2019 nCoV)    Coronavirus HKU1 NOT DETECTED NOT DETECTED Final   Coronavirus NL63 NOT DETECTED NOT DETECTED Final   Coronavirus OC43 NOT DETECTED NOT DETECTED Final   Metapneumovirus NOT DETECTED NOT DETECTED Final   Rhinovirus / Enterovirus NOT DETECTED NOT DETECTED Final   Influenza A NOT DETECTED NOT DETECTED Final   Influenza B NOT DETECTED NOT DETECTED Final   Parainfluenza Virus 1 NOT DETECTED NOT DETECTED  Final   Parainfluenza Virus 2 NOT DETECTED NOT DETECTED Final   Parainfluenza Virus 3 NOT DETECTED NOT DETECTED Final   Parainfluenza Virus 4 NOT DETECTED NOT DETECTED Final   Respiratory Syncytial Virus NOT DETECTED NOT DETECTED Final   Bordetella pertussis NOT DETECTED NOT DETECTED Final   Bordetella Parapertussis NOT DETECTED NOT DETECTED Final   Chlamydophila pneumoniae NOT DETECTED NOT DETECTED Final   Mycoplasma pneumoniae NOT DETECTED NOT DETECTED Final    Comment: Performed at [facility], [address], [place], [name] [address]  Culture, blood (Routine X 2) w Reflex to ID Panel     Status: None (Preliminary result)   Collection Time: 06/30/24  9:53 AM   Specimen: BLOOD RIGHT ARM  Result Value Ref Range Status   Specimen Description BLOOD RIGHT ARM  Final   Special Requests   Final    BOTTLES DRAWN AEROBIC AND ANAEROBIC Blood Culture adequate volume   Culture   Final    NO GROWTH < 24 HOURS Performed at [facility], [address], [place], [name] [address]    Report Status PENDING  Incomplete  Culture, blood (Routine X 2) w Reflex to ID Panel     Status: None (Preliminary result)   Collection Time: 06/30/24  9:53 AM   Specimen: BLOOD LEFT ARM  Result Value Ref Range Status   Specimen Description BLOOD LEFT ARM  Final   Special Requests   Final    BOTTLES DRAWN AEROBIC AND ANAEROBIC Blood Culture adequate volume   Culture   Final    NO GROWTH < 24 HOURS Performed at [facility], [address], [place], [name] [address]    Report Status PENDING  Incomplete     Labs:  CBC: Recent Labs  Lab 06/28/24 1655 06/28/24 1726 06/29/24 0623 06/30/24 0235  WBC 9.2  --  11.6* 14.2*  NEUTROABS 6.3  --   --   --   HGB 13.0 13.6  13.9 12.3* 12.9*  HCT 41.8 40.0  41.0 38.9* 40.6  MCV 84.3  --  84.2 83.4  PLT 240  --  213 238   BMP &GFR Recent Labs  Lab 06/28/24 1655 06/28/24 1726 06/29/24 0623 06/30/24 0235  NA 137 139  139 140 137  K  4.2 4.2  4.3 3.4* 3.8  CL 104 105 109 105  CO2 23  --  23 21*  GLUCOSE 312* 315* 209* 223*  BUN 29* 31* 27* 26*  CREATININE 2.24* 2.20* 1.92* 2.07*  CALCIUM  8.5*  --  7.6* 8.6*  MG 2.0  --   --  1.7  PHOS  --   --   --  3.2   Estimated Creatinine Clearance: 29.1 mL/min (A) (by C-G formula based on SCr of 2.07 mg/dL (H)). Liver & Pancreas: Recent Labs  Lab 06/28/24 1655 06/29/24 0623 06/30/24 0235  AST 17 13*  --   ALT 11 10  --  ALKPHOS 107 88  --   BILITOT 0.8 0.8  --   PROT 6.3* 5.4*  --   ALBUMIN  3.0* 2.6* 2.9*   No results for input(s): LIPASE, AMYLASE in the last 168 hours. No results for input(s): AMMONIA in the last 168 hours. Diabetic: Recent Labs    06/29/24 0836  HGBA1C 8.8*   Recent Labs  Lab 06/30/24 0616 06/30/24 1129 06/30/24 1530 06/30/24 2031 07/01/24 0805  GLUCAP 225* 221* 326* 256* 249*   Cardiac Enzymes: No results for input(s): CKTOTAL, CKMB, CKMBINDEX, TROPONINI in the last 168 hours. No results for input(s): PROBNP in the last 8760 hours. Coagulation Profile: No results for input(s): INR, PROTIME in the last 168 hours. Thyroid  Function Tests: No results for input(s): TSH, T4TOTAL, FREET4, T3FREE, THYROIDAB in the last 72 hours. Lipid Profile: Recent Labs    06/29/24 0500  CHOL 171  HDL 28*  LDLCALC 123*  TRIG 100  CHOLHDL 6.1   Anemia Panel: Recent Labs    06/29/24 0836  VITAMINB12 2,385*   Urine analysis:    Component Value Date/Time   COLORURINE YELLOW 06/29/2024 1330   APPEARANCEUR CLEAR 06/29/2024 1330   LABSPEC 1.021 06/29/2024 1330   PHURINE 5.0 06/29/2024 1330   GLUCOSEU >=500 (A) 06/29/2024 1330   HGBUR SMALL (A) 06/29/2024 1330   BILIRUBINUR NEGATIVE 06/29/2024 1330   KETONESUR NEGATIVE 06/29/2024 1330   PROTEINUR 100 (A) 06/29/2024 1330   UROBILINOGEN 0.2 10/02/2009 1604   NITRITE NEGATIVE 06/29/2024 1330   LEUKOCYTESUR TRACE (A) 06/29/2024 1330   Sepsis Labs: Invalid  input(s): PROCALCITONIN, LACTICIDVEN   SIGNED:  Carron Jaggi T Shakemia Madera, MD  [facility] 07/01/2024, 5:42 PM

## 2024-07-01 NOTE — Care Management Important Message (Signed)
 Important Message  Patient Details  Name: Fernando Hess MRN: [id number] Date of Birth: 05/23/1946   Important Message Given:  Yes - Medicare IM    Patient left prior to IM delivery will mail a copy to the patient home address.    Shajuana Mclucas 07/01/2024, 1:27 PM

## 2024-07-03 NOTE — Progress Notes (Unsigned)
 Cardiology Office Note   Date:  07/05/2024  ID:  Brennin, Durfee [birth year], MRN [id number] PCP: Okey Carlin Redbird, MD  [facility] Providers Cardiologist:  Powell FORBES Sorrow, MD (Inactive) Cardiology APP:  Lelon Glendia DASEN, [name]  Electrophysiologist:  OLE DASEN HOLTS, MD    History of Present Illness Fernando Hess is a 78 y.o. male with a past medical history of CAD, CHF, atrial relation, AMS here for follow-up appointment.  Had an admission in December 2024 that was overall felt to be related to pneumonia.  Echocardiogram at that time demonstrated EF of 65%, mild to moderate aortic stenosis with mean gradient of 9 and V-max of 2.5 cm/s.  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  Was evaluated by Dr. HOLTS  in September 2021 after episode of syncope.  The patient was short lasting post conversation pause of only 3 seconds.  Follow-up monitoring with an ILR was offered but the patient declined at that time.  Was seen 02/01/2024 and was previously on atorvastatin  for hyperlipidemia and is unclear.  Discontinued it due to myasthenia gravis.  Experienced some shortness of breath particularly at night but denied any chest comfort or palpitations.  Has chronic dyspnea for a long time has been worked up extensively.  Uses a walker for mobility and has been in and out of rehabilitation facilities with the last stay being in December of the previous year.  Reports swelling in his legs since gained about 12 pounds since April 1.  Chronic kidney disease and is followed by nephrologist Dr. Prescilla.  Has not experienced any recent episode of syncope or palpitation.  He does not have a home blood pressure monitor and takes hydralazine  as needed when blood pressure is high during doctor visits.  He has been experiencing dental issues having chipped 2 teeth and requiring 4 fillings.  Today, he has a hx of myasthenia gravis who presents for  management of cholesterol medication and follow-up on stroke recovery.  He has myasthenia gravis, which limits the use of statins for cholesterol management.  He wants to be started on Leqvio for cholesterol management and prefers the injection to be at the same time of his wife's injection which is on November 3 so they can both get them done at the same time.  It does not appear that a PA has been initiated.  Will reach out to our Pharm.D. team.  He has a history of stroke, resulting in increased weakness. He did not undergo physical or occupational therapy post-stroke but found previous pool therapy beneficial. We have highly recommended this and suggested reaching out to PCP or neurology for referral.   He has atrial fibrillation and is on Eliquis , 2.5 mg twice a day, for anticoagulation.  He has chronic kidney disease, stage 4, with a recent creatinine level of 2.0, improved from previous levels. He takes a diuretic, 40 mg daily, which helps manage swelling in his feet and ankles. No swelling in feet or ankles.  He is diabetic with an A1c of 8.8. His primary care physician is working on managing his diabetes.  Nocturnal dyspnea, needs nighttime oxygen.   Reports no shortness of breath nor dyspnea on exertion. Reports no chest pain, pressure, or tightness. No edema, orthopnea, PND. Reports no palpitations.   Discussed the use of AI scribe software for clinical note transcription with the patient, who gave verbal consent to proceed.   ROS: pertinent ROS in HPI  Studies Reviewed  Sonographer Comments: Technically difficult study due to poor echo  windows, suboptimal apical window and suboptimal subcostal window.  IMPRESSIONS     1. Left ventricular ejection fraction, by estimation, is 70 to 75%. The  left ventricle has hyperdynamic function. The left ventricle has no  regional wall motion abnormalities. There is mild left ventricular  hypertrophy of the basal-septal segment.  Left  ventricular diastolic parameters are consistent with Grade I diastolic  dysfunction (impaired relaxation).   2. Right ventricular systolic function is normal. The right ventricular  size is normal. Tricuspid regurgitation signal is inadequate for assessing  PA pressure.   3. The mitral valve is normal in structure. Trivial mitral valve  regurgitation. No evidence of mitral stenosis.   4. The aortic valve is calcified. Aortic valve regurgitation is mild.  Mild aortic valve stenosis.   5. The inferior vena cava is normal in size with greater than 50%  respiratory variability, suggesting right atrial pressure of 3 mmHg.   Risk Assessment/Calculations  CHA2DS2-VASc Score = 6  This indicates a 9.7% annual risk of stroke. The patient's score is based upon: CHF History: 1 HTN History: 1 Diabetes History: 1 Stroke History: 0 Vascular Disease History: 1 Age Score: 2 Gender Score: 0       Physical Exam VS:  BP 128/66   Pulse 76   Ht 5' 6 (1.676 m)   Wt 190 lb (86.2 kg)   SpO2 98%   BMI 30.67 kg/m        Wt Readings from Last 3 Encounters:  07/05/24 190 lb (86.2 kg)  06/29/24 187 lb 2.7 oz (84.9 kg)  06/21/24 193 lb 9.6 oz (87.8 kg)    GEN: Well nourished, well developed in no acute distress NECK: No JVD; No carotid bruits CARDIAC: RRR, no murmurs, rubs, gallops RESPIRATORY:  Clear to auscultation without rales, wheezing or rhonchi  ABDOMEN: Soft, non-tender, non-distended EXTREMITIES:  No edema; No deformity   ASSESSMENT AND PLAN  Hyperlipidemia Management complicated by myasthenia gravis, contraindicating statins. Lovaza is suitable. Leqvio considered pending authorization. -LDL 123, Tgs 100 - Administer Lovaza during hospitalization. - Initiate prior authorization for Leqvio. - Coordinate with pharmacy for Leqvio approval. - Schedule Leqvio injection on November 3rd. - Monitor lipid panel in a few months.  Myasthenia gravis Significant consideration in  medication management, particularly avoiding statins.  Chronic diastolic heart failure Well-managed with effective diuretic therapy. Blood pressure controlled, no edema.  Atrial fibrillation Well-controlled with heart rate in the 70s, no recent issues.  Chronic kidney disease, stage 4 Creatinine improved to 2.0. Well-managed with diuretics, no swelling.  Type 2 diabetes mellitus A1c of 8.8. 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 - Recommend referral to [facility] for aquatic physical therapy. Discuss with neurology at next follow-up visit.   Dispo: He can follow-up in 6 months with Dr. Mona   Signed, Orren LOISE Fabry, [name]

## 2024-07-05 ENCOUNTER — Other Ambulatory Visit (HOSPITAL_COMMUNITY): Payer: Self-pay

## 2024-07-05 ENCOUNTER — Telehealth: Payer: Self-pay | Admitting: Pharmacy Technician

## 2024-07-05 ENCOUNTER — Ambulatory Visit: Attending: Physician Assistant | Admitting: Physician Assistant

## 2024-07-05 ENCOUNTER — Encounter: Payer: Self-pay | Admitting: Physician Assistant

## 2024-07-05 VITALS — BP 128/66 | HR 76 | Ht 66.0 in | Wt 190.0 lb

## 2024-07-05 DIAGNOSIS — I2510 Atherosclerotic heart disease of native coronary artery without angina pectoris: Secondary | ICD-10-CM | POA: Diagnosis not present

## 2024-07-05 DIAGNOSIS — I5032 Chronic diastolic (congestive) heart failure: Secondary | ICD-10-CM | POA: Diagnosis not present

## 2024-07-05 DIAGNOSIS — N184 Chronic kidney disease, stage 4 (severe): Secondary | ICD-10-CM

## 2024-07-05 DIAGNOSIS — Z79899 Other long term (current) drug therapy: Secondary | ICD-10-CM

## 2024-07-05 DIAGNOSIS — E785 Hyperlipidemia, unspecified: Secondary | ICD-10-CM

## 2024-07-05 DIAGNOSIS — I10 Essential (primary) hypertension: Secondary | ICD-10-CM | POA: Diagnosis not present

## 2024-07-05 LAB — CULTURE, BLOOD (ROUTINE X 2)
Culture: NO GROWTH
Culture: NO GROWTH
Special Requests: ADEQUATE
Special Requests: ADEQUATE

## 2024-07-05 NOTE — Telephone Encounter (Signed)
 Hi, insurance denied leqvio stating repatha was preferred. The prior authorization for repatha was approved and the copay would be free.  I went ahead and did the prior authorization in case you agreed with that route. If for some reason he can't have repatha that I missed, can you let me know and I can try to appeal the leqvio denial. Thank you!

## 2024-07-05 NOTE — Telephone Encounter (Signed)
    Neuro started see media.   Pharmacy Patient Advocate Encounter   Received notification from Pt Calls Messages that prior authorization for leqvio is required/requested.   Insurance verification completed.   The patient is insured through [facility].   Per test claim: PA required; PA submitted to above mentioned insurance via Latent Key/confirmation #/EOC [id number] Status is pending

## 2024-07-05 NOTE — Patient Instructions (Addendum)
 Medication Instructions:  Your physician recommends that you continue on your current medications as directed. Please refer to the Current Medication list given to you today. *If you need a refill on your cardiac medications before your next appointment, please call your pharmacy*  Lab Work: None ordered If you have labs (blood work) drawn today and your tests are completely normal, you will receive your results only by: MyChart Message (if you have MyChart) OR A paper copy in the mail If you have any lab test that is abnormal or we need to change your treatment, we will call you to review the results.  Testing/Procedures: None ordered  Follow-Up: At [facility], you and your health needs are our priority.  As part of our continuing mission to provide you with exceptional heart care, our providers are all part of one team.  This team includes your primary Cardiologist (physician) and Advanced Practice Providers or APPs (Physician Assistants and Nurse Practitioners) who all work together to provide you with the care you need, when you need it.  Your next appointment:   6 month(s)  Provider:   K. Italy Hilty, MD    We recommend signing up for the patient portal called MyChart.  Sign up information is provided on this After Visit Summary.  MyChart is used to connect with patients for Virtual Visits (Telemedicine).  Patients are able to view lab/test results, encounter notes, upcoming appointments, etc.  Non-urgent messages can be sent to your provider as well.   To learn more about what you can do with MyChart, go to [email or web address].   Other Instructions

## 2024-07-05 NOTE — Telephone Encounter (Signed)
 Leqvio denied. Insurance prefers repatha  Pharmacy Patient Advocate Encounter   Received notification from Physician's Office that prior authorization for repatha is required/requested.   Insurance verification completed.   The patient is insured through [facility].   Per test claim: PA required; PA submitted to above mentioned insurance via Latent Key/confirmation #/EOC [id number] Status is pending

## 2024-07-06 NOTE — Telephone Encounter (Signed)
 Called pt at [phone]. LVM for him to call office.   Phone room: Please let him know he needs to follow up with current neurologist, Glory Runheim,MD about Leqvio medication.

## 2024-07-13 ENCOUNTER — Ambulatory Visit: Admitting: Physician Assistant

## 2024-07-29 ENCOUNTER — Ambulatory Visit

## 2024-08-03 ENCOUNTER — Ambulatory Visit: Attending: Cardiology

## 2024-08-03 DIAGNOSIS — E782 Mixed hyperlipidemia: Secondary | ICD-10-CM

## 2024-08-03 MED ORDER — REPATHA SURECLICK 140 MG/ML ~~LOC~~ SOAJ: 140.0000 mg | SUBCUTANEOUS | 3 refills | Status: AC

## 2024-08-03 NOTE — Patient Instructions (Signed)
 Your Results:             Your most recent labs Goal  Total Cholesterol 171 < 200  Triglycerides 100 < 150  HDL (happy/good cholesterol) 28 > 40  LDL (lousy/bad cholesterol 123 < 55   Medication changes: Begin taking Repatha 140 mg every 14 days We want to repeat labs after 3 months after starting Repatha.  We will send you a lab order to remind you once we get closer to that time.   Please call me if you have any questions or concerns  Keante Urizar E. Lodie Waheed, Pharm.D [facility] [facility] [address], [place], [name] [address] Phone: [phone]; Fax: [phone]     Repatha is a cholesterol medication that improved your body's ability to get rid of bad cholesterol known as LDL. It can lower your LDL up to 60%! It is an injection that is given under the skin every 2 weeks. The medication often requires a prior authorization from your insurance company. We will take care of submitting all the necessary information to your insurance company to get it approved. The most common side effects of Repatha include runny nose, symptoms of the common cold, rarely flu or flu-like symptoms, back/muscle pain in about 3-4% of the patients, and redness, pain, or bruising at the injection site.

## 2024-08-03 NOTE — Progress Notes (Signed)
 Patient ID: Fernando Hess                 DOB: 1946-05-15                    MRN: [id number]      HPI: Fernando Hess is a 78 y.o. male patient referred to lipid clinic by Dr. Kathrin. PMH is significant for HLD, HTN, HFpEF, atrial fibrillation, PAD, and CAD, NSTEMI (2017) s/p CABG x5 and DM.   Patient presents today accompanied by his wife. He is currently not taking any medication for hyperlipidemia management. He previously used statins, which exacerbated his myasthenia gravis and triggered flare-ups; he reports that his myasthenia gravis is currently stable. His wife reports that she is currently on Leqvio and has had good results. They last saw their cardiology provider in October 2025, at which time patient's wife requested that he start Leqvio. Upon further review, Leqvio was denied, and Repatha  was approved with a zero-dollar copay. The patient expressed reluctance to restart Repatha  due to feeling unwell when taking it five years ago, though he denied any serious adverse reactions, including anaphylaxis.   After discussion, he agreed to retry Repatha  given that Leqvio was denied. I also offered to initiate an appeal for Leqvio but patient willing to retry Repatha  first since it has been several years.  Reviewed options for lowering LDL cholesterol, including PCSK-9 inhibitors, and inclisiran.  Discussed mechanisms of action, dosing, side effects and potential decreases in LDL cholesterol.  Also reviewed cost information and potential options for patient assistance.   Current Medications: none  Intolerances: statins (Makes myasthenia gravis worse or flare up), Repatha  (makes me sick) Risk Factors: age, PAD, CAD, NSTEMI (2017) s/p CABG x5, atrial fibrillation, HFpEF, DM LDL goal: < 55 Lipid panel (06/2024): Chol 171, Trig 100, HDL 28, LDL 123  Family History:  Relation Problem Comments  Mother (Deceased) Heart attack     Father (Deceased) Heart attack     Sister Metallurgist)   Sister  Metallurgist)   Brother (Deceased)   Brother Metallurgist)   Brother Diabetes   Pancreatic cancer     Brother Diabetes     Maternal Grandmother (Deceased)   Maternal Grandfather (Deceased)   Paternal Grandmother (Deceased)   Paternal Grandfather (Deceased)   Neg Hx Colon cancer   Colon polyps   Esophageal cancer   Prostate cancer   Rectal cancer   Stomach cancer       Social History:  Alcohol : none Smoking: none  Labs:  Lipid Panel     Component Value Date/Time   CHOL 171 06/29/2024 0500   CHOL 121 01/22/2022 0815   TRIG 100 06/29/2024 0500   HDL 28 (L) 06/29/2024 0500   HDL 42 01/22/2022 0815   CHOLHDL 6.1 06/29/2024 0500   VLDL 20 06/29/2024 0500   LDLCALC 123 (H) 06/29/2024 0500   LDLCALC 57 01/22/2022 0815   LABVLDL 22 01/22/2022 0815    Past Medical History:  Diagnosis Date   Acute renal failure (ARF) 09/24/2015   Acute stress disorder 11/13/2021   Atrial premature complexes    CAD (coronary artery disease) of artery bypass graft    Early occlusion of saphenous vein graft to intermediate and marginal branch in February 2007 following bypass grafting    CAD (coronary artery disease), native coronary artery 2017   hx NSTEMI 09-24-2015  s/p  CABG x5 on 10-02-2015;  post op STEMI inferolateral wall,  SVG OM1 and SVG OM2  occluded, distal OM occlusion the calpruit, treated medically // Myoview  7/21: no ischemia, EF 65, low risk   CHF (congestive heart failure) ([name])    CKD (chronic kidney disease), stage III ([name])    Contusion of right knee 03/16/2020   Dyspnea    Elevated troponin    Erectile dysfunction    Esophageal reflux    History of atrial fibrillation    post op CABG 10-02-2015   History of kidney stones    History of non-ST elevation myocardial infarction (NSTEMI) 09/24/2015   s/p  CABG x5   History of ST elevation myocardial infarction (STEMI) 10/22/2015   inferior wall,  post op CABG 10-02-2015   Hyperlipidemia    Hypertension    Left ureteral stone     Mild atherosclerosis of both carotid arteries    Nephrolithiasis    per CT bilateral non-obstructive calculi   Nocturnal hypoxemia    OSA (obstructive sleep apnea)    Peripheral artery disease    LE Arterial US  01/2019: R PTA and ATA occluded; L ATA occluded   RBBB (right bundle branch block)    Renal atrophy, right    Sleep apnea    wears cpap    ST elevation myocardial infarction (STEMI) of inferior wall ([name]) 10/22/2015   Type 2 diabetes mellitus treated with insulin  ([name])    followed by pcp   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 90 tablet 0   apixaban  (ELIQUIS ) 2.5 MG TABS tablet Take 1 tablet (2.5 mg total) by mouth 2 (two) times daily. 180 tablet 0   aspirin  EC 81 MG tablet Take 1 tablet (81 mg total) by mouth daily. Swallow whole.     calcium  carbonate (TUMS) 500 MG chewable tablet Chew 1 tablet (200 mg of elemental calcium  total) by mouth 2 (two) times daily. 8 tablet 0   Continuous Glucose Receiver (FREESTYLE LIBRE 3 READER) DEVI USE TO TEST BLOOD SUGAR     Continuous Glucose Sensor (FREESTYLE LIBRE 3 PLUS SENSOR) MISC CHANGE EVERY 15 DAYS AS RECOMMENDED BY THE MANUFACTURER 90 DAYS     cyanocobalamin  (VITAMIN B12) 1000 MCG/ML injection Inject 1,000 mcg into the muscle once a week. Inject on Saturday     donepezil  (ARICEPT ) 10 MG tablet Take 10 mg by mouth at bedtime.     furosemide  (LASIX ) 40 MG tablet Take 1 tablet (40 mg total) by mouth 3 (three) times a week. Take one tablet (40mg ) by mouth three times weekly on Monday, Wednesday, and Friday. (Patient taking differently: Take 40 mg by mouth 3 (three) times a week. Take one tablet (40mg ) by mouth three times weekly on Monday, Wednesday, and  Friday.occasionally daily)     hydrALAZINE  (APRESOLINE ) 25 MG tablet Take 1 tablet (25 mg total) by mouth daily as needed (Hypertension). Take one tablet (25mg ) by mouth daily as needed if SBP is >160     insulin  lispro (HUMALOG ) 100 UNIT/ML KwikPen Inject 8 Units into the skin 3 (three) times daily before meals.     lamoTRIgine  (LAMICTAL ) 25 MG tablet Take 25 mg by mouth daily.     LANTUS  SOLOSTAR 100 UNIT/ML Solostar Pen Inject 36 Units into the skin daily before breakfast.  latanoprost  (XALATAN ) 0.005 % ophthalmic solution Place 1 drop into both eyes at bedtime.     mirabegron  ER (MYRBETRIQ ) 50 MG TB24 tablet Take 50 mg by mouth at bedtime.     ondansetron  (ZOFRAN -ODT) 4 MG disintegrating tablet Take 1 tablet (4 mg total) by mouth every 8 (eight) hours as needed. 20 tablet 0   pantoprazole  (PROTONIX ) 40 MG tablet TAKE 1 TABLET BY MOUTH EVERY DAY 90 tablet 2   QUEtiapine  (SEROQUEL ) 25 MG tablet Take 1 tablet (25 mg total) by mouth at bedtime. 30 tablet 0   tamsulosin  (FLOMAX ) 0.4 MG CAPS capsule Take 0.4 mg by mouth daily.     TRADJENTA  5 MG TABS tablet Take 5 mg by mouth daily.     No current facility-administered medications on file prior to visit.    Allergies  Allergen Reactions   Cilostazol  Swelling and Other (See Comments)    Edema    Magnesium -Containing Compounds Other (See Comments)    Avoid magnesium  with hx myasthenia gravis (can worsen muscle weakness and trigger a severe exacerbation)   Statins     Makes myasthenia gravis worse or flare up   Tirzepatide     Other Reaction(s): Not available   Dulaglutide Nausea And Vomiting and Other (See Comments)    TRULICITY   Levofloxacin Hives, Itching and Rash   Liraglutide Other (See Comments)    Severe fatigue & insomnia   Lisinopril Itching, Rash and Cough    Assessment/Plan:  1. Hyperlipidemia -  Problem  Hyperlipidemia   Hyperlipidemia Assessment:  LDL goal: < 55 mg/dl;last LDLc 876 mg/dl (89/7974) Intolerance  to statins (exacerbated myasthenia gravis)  Reports feeling sick but unable to recall specific side effects though denied any serious adverse events, including anaphylaxis  Discussed next potential options (PCSK-9 inhibitors and inclisiran); cost, dosing efficacy, side effects  Discussed options: Leqvio appeal vs retrying Repatha  Patient willing to retry Repatha  given Leqvio denial and zero dollar copay for Repatha   Plan: Begin Repatha  140 mg every 14 days Lipid lab due in 3 months after starting PCSK9i  Patient to call if he has any questions/concerns   Thank you,  Dorsey E. Anaelle Dunton, Pharm.D [place]. [facility] [address], [place], [name] [address] Phone: [phone]; Fax: [phone]

## 2024-08-03 NOTE — Assessment & Plan Note (Addendum)
 Assessment:  LDL goal: < 55 mg/dl;last LDLc 876 mg/dl (89/7974) Intolerance to statins (exacerbated myasthenia gravis)  Reports feeling sick but unable to recall specific side effects though denied any serious adverse events, including anaphylaxis  Discussed next potential options (PCSK-9 inhibitors and inclisiran); cost, dosing efficacy, side effects  Discussed options: Leqvio appeal vs retrying Repatha Patient willing to retry Repatha given Leqvio denial and zero dollar copay for Repatha  Plan: Begin Repatha 140 mg every 14 days Lipid lab due in 3 months after starting PCSK9i  Patient to call if he has any questions/concerns

## 2024-08-12 ENCOUNTER — Other Ambulatory Visit: Payer: Self-pay | Admitting: Physician Assistant

## 2024-08-12 MED ORDER — NITROGLYCERIN 0.4 MG SL SUBL: 0.4000 mg | SUBLINGUAL_TABLET | SUBLINGUAL | 3 refills | Status: AC | PRN

## 2024-08-19 ENCOUNTER — Telehealth: Payer: Self-pay

## 2024-08-19 NOTE — Telephone Encounter (Signed)
 Reviewed. Given his current age and comorbidities, no further surveillance colonoscopy is recommended

## 2024-08-19 NOTE — Telephone Encounter (Signed)
 Left message as well as the number to the office for patient to call back to the office to schedule an appt;  Dr. Abran A recall assessment sheet was completed on this patient, however, he is on a blood thinner per the computer as well as at age the age of 32-   Please advise if this patient will need an OV with you or an APP or can he be a recall for [facility] procedure  Please/thank you Bre, PV RN

## 2024-08-19 NOTE — Telephone Encounter (Signed)
 Attempted to reach patient concerning Dr. Nancyann current recommendations; unable to speak with patient and mailbox is full; will attempt to reach patient at a later date/time;

## 2024-08-22 NOTE — Telephone Encounter (Signed)
 Called and spoke with patient's wife and patient-given MD recommendations and he has stated he does not wish to have a colonoscopy; Patient denies any issues to have need for a colonoscopy, such as abdominal pain, rectal bleeding, bloating, or change in bowel habits; Patient advised to call back to the office at [phone] should questions/concerns arise;

## 2024-08-22 NOTE — Telephone Encounter (Signed)
 Please update recall notice to NO further colonoscopy needed due to age per Dr. Abran Please/thank you Bre, PV RN

## 2024-09-27 ENCOUNTER — Emergency Department (HOSPITAL_COMMUNITY)

## 2024-09-27 ENCOUNTER — Inpatient Hospital Stay (HOSPITAL_COMMUNITY)
Admission: EM | Admit: 2024-09-27 | Discharge: 2024-10-03 | DRG: 177 | Disposition: A | Discharge status: Home Health | Attending: Internal Medicine | Admitting: Internal Medicine

## 2024-09-27 ENCOUNTER — Other Ambulatory Visit: Payer: Self-pay

## 2024-09-27 DIAGNOSIS — I480 Paroxysmal atrial fibrillation: Secondary | ICD-10-CM | POA: Diagnosis present

## 2024-09-27 DIAGNOSIS — I10 Essential (primary) hypertension: Secondary | ICD-10-CM | POA: Diagnosis not present

## 2024-09-27 DIAGNOSIS — G4733 Obstructive sleep apnea (adult) (pediatric): Secondary | ICD-10-CM | POA: Diagnosis present

## 2024-09-27 DIAGNOSIS — E11649 Type 2 diabetes mellitus with hypoglycemia without coma: Secondary | ICD-10-CM | POA: Diagnosis not present

## 2024-09-27 DIAGNOSIS — F03B0 Unspecified dementia, moderate, without behavioral disturbance, psychotic disturbance, mood disturbance, and anxiety: Secondary | ICD-10-CM | POA: Diagnosis present

## 2024-09-27 DIAGNOSIS — I4892 Unspecified atrial flutter: Secondary | ICD-10-CM | POA: Diagnosis present

## 2024-09-27 DIAGNOSIS — Z515 Encounter for palliative care: Secondary | ICD-10-CM | POA: Diagnosis not present

## 2024-09-27 DIAGNOSIS — Z9181 History of falling: Secondary | ICD-10-CM

## 2024-09-27 DIAGNOSIS — E1165 Type 2 diabetes mellitus with hyperglycemia: Secondary | ICD-10-CM | POA: Diagnosis present

## 2024-09-27 DIAGNOSIS — N2581 Secondary hyperparathyroidism of renal origin: Secondary | ICD-10-CM | POA: Diagnosis present

## 2024-09-27 DIAGNOSIS — Z833 Family history of diabetes mellitus: Secondary | ICD-10-CM

## 2024-09-27 DIAGNOSIS — Z7982 Long term (current) use of aspirin: Secondary | ICD-10-CM

## 2024-09-27 DIAGNOSIS — E1122 Type 2 diabetes mellitus with diabetic chronic kidney disease: Secondary | ICD-10-CM | POA: Diagnosis present

## 2024-09-27 DIAGNOSIS — T380X5A Adverse effect of glucocorticoids and synthetic analogues, initial encounter: Secondary | ICD-10-CM | POA: Diagnosis present

## 2024-09-27 DIAGNOSIS — Z789 Other specified health status: Secondary | ICD-10-CM

## 2024-09-27 DIAGNOSIS — N179 Acute kidney failure, unspecified: Secondary | ICD-10-CM | POA: Diagnosis not present

## 2024-09-27 DIAGNOSIS — Z7901 Long term (current) use of anticoagulants: Secondary | ICD-10-CM

## 2024-09-27 DIAGNOSIS — I509 Heart failure, unspecified: Secondary | ICD-10-CM

## 2024-09-27 DIAGNOSIS — R7989 Other specified abnormal findings of blood chemistry: Secondary | ICD-10-CM

## 2024-09-27 DIAGNOSIS — Z1152 Encounter for screening for COVID-19: Secondary | ICD-10-CM

## 2024-09-27 DIAGNOSIS — R296 Repeated falls: Secondary | ICD-10-CM | POA: Diagnosis present

## 2024-09-27 DIAGNOSIS — I5042 Chronic combined systolic (congestive) and diastolic (congestive) heart failure: Secondary | ICD-10-CM | POA: Diagnosis present

## 2024-09-27 DIAGNOSIS — E1151 Type 2 diabetes mellitus with diabetic peripheral angiopathy without gangrene: Secondary | ICD-10-CM | POA: Diagnosis present

## 2024-09-27 DIAGNOSIS — A419 Sepsis, unspecified organism: Secondary | ICD-10-CM | POA: Diagnosis not present

## 2024-09-27 DIAGNOSIS — N184 Chronic kidney disease, stage 4 (severe): Secondary | ICD-10-CM | POA: Diagnosis present

## 2024-09-27 DIAGNOSIS — Z961 Presence of intraocular lens: Secondary | ICD-10-CM | POA: Diagnosis present

## 2024-09-27 DIAGNOSIS — I130 Hypertensive heart and chronic kidney disease with heart failure and stage 1 through stage 4 chronic kidney disease, or unspecified chronic kidney disease: Secondary | ICD-10-CM | POA: Diagnosis present

## 2024-09-27 DIAGNOSIS — J189 Pneumonia, unspecified organism: Secondary | ICD-10-CM | POA: Diagnosis not present

## 2024-09-27 DIAGNOSIS — Z96641 Presence of right artificial hip joint: Secondary | ICD-10-CM | POA: Diagnosis present

## 2024-09-27 DIAGNOSIS — Z79899 Other long term (current) drug therapy: Secondary | ICD-10-CM

## 2024-09-27 DIAGNOSIS — J690 Pneumonitis due to inhalation of food and vomit: Secondary | ICD-10-CM | POA: Diagnosis present

## 2024-09-27 DIAGNOSIS — Z951 Presence of aortocoronary bypass graft: Secondary | ICD-10-CM

## 2024-09-27 DIAGNOSIS — Z881 Allergy status to other antibiotic agents status: Secondary | ICD-10-CM

## 2024-09-27 DIAGNOSIS — Z7984 Long term (current) use of oral hypoglycemic drugs: Secondary | ICD-10-CM

## 2024-09-27 DIAGNOSIS — N400 Enlarged prostate without lower urinary tract symptoms: Secondary | ICD-10-CM | POA: Diagnosis present

## 2024-09-27 DIAGNOSIS — Z9841 Cataract extraction status, right eye: Secondary | ICD-10-CM

## 2024-09-27 DIAGNOSIS — E113313 Type 2 diabetes mellitus with moderate nonproliferative diabetic retinopathy with macular edema, bilateral: Secondary | ICD-10-CM | POA: Diagnosis present

## 2024-09-27 DIAGNOSIS — Z8249 Family history of ischemic heart disease and other diseases of the circulatory system: Secondary | ICD-10-CM

## 2024-09-27 DIAGNOSIS — E119 Type 2 diabetes mellitus without complications: Secondary | ICD-10-CM

## 2024-09-27 DIAGNOSIS — E785 Hyperlipidemia, unspecified: Secondary | ICD-10-CM | POA: Diagnosis present

## 2024-09-27 DIAGNOSIS — J9601 Acute respiratory failure with hypoxia: Secondary | ICD-10-CM | POA: Diagnosis present

## 2024-09-27 DIAGNOSIS — Z794 Long term (current) use of insulin: Secondary | ICD-10-CM | POA: Diagnosis not present

## 2024-09-27 DIAGNOSIS — Z9842 Cataract extraction status, left eye: Secondary | ICD-10-CM

## 2024-09-27 DIAGNOSIS — F419 Anxiety disorder, unspecified: Secondary | ICD-10-CM | POA: Diagnosis present

## 2024-09-27 DIAGNOSIS — G7000 Myasthenia gravis without (acute) exacerbation: Secondary | ICD-10-CM | POA: Diagnosis not present

## 2024-09-27 DIAGNOSIS — N1832 Chronic kidney disease, stage 3b: Secondary | ICD-10-CM | POA: Diagnosis present

## 2024-09-27 DIAGNOSIS — K219 Gastro-esophageal reflux disease without esophagitis: Secondary | ICD-10-CM | POA: Diagnosis present

## 2024-09-27 DIAGNOSIS — I252 Old myocardial infarction: Secondary | ICD-10-CM

## 2024-09-27 DIAGNOSIS — E871 Hypo-osmolality and hyponatremia: Secondary | ICD-10-CM | POA: Diagnosis not present

## 2024-09-27 DIAGNOSIS — I5030 Unspecified diastolic (congestive) heart failure: Secondary | ICD-10-CM | POA: Diagnosis present

## 2024-09-27 DIAGNOSIS — Z66 Do not resuscitate: Secondary | ICD-10-CM | POA: Diagnosis present

## 2024-09-27 DIAGNOSIS — I2510 Atherosclerotic heart disease of native coronary artery without angina pectoris: Secondary | ICD-10-CM | POA: Diagnosis present

## 2024-09-27 DIAGNOSIS — E870 Hyperosmolality and hypernatremia: Secondary | ICD-10-CM | POA: Diagnosis not present

## 2024-09-27 LAB — RESP PANEL BY RT-PCR (RSV, FLU A&B, COVID)  RVPGX2
Influenza A by PCR: NEGATIVE
Influenza B by PCR: NEGATIVE
Resp Syncytial Virus by PCR: NEGATIVE
SARS Coronavirus 2 by RT PCR: NEGATIVE

## 2024-09-27 LAB — COMPREHENSIVE METABOLIC PANEL WITH GFR
ALT: 8 U/L (ref 0–44)
AST: 17 U/L (ref 15–41)
Albumin: 3.1 g/dL — ABNORMAL LOW (ref 3.5–5.0)
Alkaline Phosphatase: 105 U/L (ref 38–126)
Anion gap: 11 (ref 5–15)
BUN: 34 mg/dL — ABNORMAL HIGH (ref 8–23)
CO2: 21 mmol/L — ABNORMAL LOW (ref 22–32)
Calcium: 7.5 mg/dL — ABNORMAL LOW (ref 8.9–10.3)
Chloride: 107 mmol/L (ref 98–111)
Creatinine, Ser: 2.16 mg/dL — ABNORMAL HIGH (ref 0.61–1.24)
GFR, Estimated: 30 mL/min — ABNORMAL LOW
Glucose, Bld: 339 mg/dL — ABNORMAL HIGH (ref 70–99)
Potassium: 4.2 mmol/L (ref 3.5–5.1)
Sodium: 139 mmol/L (ref 135–145)
Total Bilirubin: 1.1 mg/dL (ref 0.0–1.2)
Total Protein: 5.7 g/dL — ABNORMAL LOW (ref 6.5–8.1)

## 2024-09-27 LAB — LIPASE, BLOOD: Lipase: 11 U/L (ref 11–51)

## 2024-09-27 LAB — URINALYSIS, W/ REFLEX TO CULTURE (INFECTION SUSPECTED)
Bilirubin Urine: NEGATIVE
Glucose, UA: >=500 mg/dL — AB
Ketones, ur: NEGATIVE mg/dL
Nitrite: NEGATIVE
Protein, ur: 100 mg/dL — AB
Specific Gravity, Urine: 1.012 (ref 1.005–1.030)
WBC, UA: >50 WBC/hpf (ref 0–5)
pH: 5 (ref 5.0–8.0)

## 2024-09-27 LAB — CBC WITH DIFFERENTIAL/PLATELET
Abs Immature Granulocytes: 0.31 K/uL — ABNORMAL HIGH (ref 0.00–0.07)
Basophils Absolute: 0.1 K/uL (ref 0.0–0.1)
Basophils Relative: 0 %
Eosinophils Absolute: 0 K/uL (ref 0.0–0.5)
Eosinophils Relative: 0 %
HCT: 32.8 % — ABNORMAL LOW (ref 39.0–52.0)
Hemoglobin: 10.4 g/dL — ABNORMAL LOW (ref 13.0–17.0)
Immature Granulocytes: 2 %
Lymphocytes Relative: 3 %
Lymphs Abs: 0.6 K/uL — ABNORMAL LOW (ref 0.7–4.0)
MCH: 27.4 pg (ref 26.0–34.0)
MCHC: 31.7 g/dL (ref 30.0–36.0)
MCV: 86.3 fL (ref 80.0–100.0)
Monocytes Absolute: 1.6 K/uL — ABNORMAL HIGH (ref 0.1–1.0)
Monocytes Relative: 9 %
Neutro Abs: 15.4 K/uL — ABNORMAL HIGH (ref 1.7–7.7)
Neutrophils Relative %: 86 %
Platelets: 200 K/uL (ref 150–400)
RBC: 3.8 MIL/uL — ABNORMAL LOW (ref 4.22–5.81)
RDW: 17.1 % — ABNORMAL HIGH (ref 11.5–15.5)
WBC: 18.1 K/uL — ABNORMAL HIGH (ref 4.0–10.5)
nRBC: 0 % (ref 0.0–0.2)

## 2024-09-27 LAB — CBG MONITORING, ED
Glucose-Capillary: 323 mg/dL — ABNORMAL HIGH (ref 70–99)
Glucose-Capillary: 280 mg/dL — ABNORMAL HIGH (ref 70–99)
Glucose-Capillary: 286 mg/dL — ABNORMAL HIGH (ref 70–99)

## 2024-09-27 LAB — I-STAT CHEM 8, ED
BUN: 40 mg/dL — ABNORMAL HIGH (ref 8–23)
Calcium, Ion: 0.99 mmol/L — ABNORMAL LOW (ref 1.15–1.40)
Chloride: 107 mmol/L (ref 98–111)
Creatinine, Ser: 2.2 mg/dL — ABNORMAL HIGH (ref 0.61–1.24)
Glucose, Bld: 336 mg/dL — ABNORMAL HIGH (ref 70–99)
HCT: 31 % — ABNORMAL LOW (ref 39.0–52.0)
Hemoglobin: 10.5 g/dL — ABNORMAL LOW (ref 13.0–17.0)
Potassium: 4.2 mmol/L (ref 3.5–5.1)
Sodium: 140 mmol/L (ref 135–145)
TCO2: 22 mmol/L (ref 22–32)

## 2024-09-27 LAB — PROTIME-INR
INR: 2.2 — ABNORMAL HIGH (ref 0.8–1.2)
Prothrombin Time: 25.5 s — ABNORMAL HIGH (ref 11.4–15.2)

## 2024-09-27 LAB — I-STAT CG4 LACTIC ACID, ED: Lactic Acid, Venous: 1.6 mmol/L (ref 0.5–1.9)

## 2024-09-27 LAB — TROPONIN T, HIGH SENSITIVITY
Troponin T High Sensitivity: 73 ng/L — ABNORMAL HIGH (ref 0–19)
Troponin T High Sensitivity: 73 ng/L — ABNORMAL HIGH (ref 0–19)

## 2024-09-27 LAB — VITAMIN B12: Vitamin B-12: 2476 pg/mL — ABNORMAL HIGH (ref 180–914)

## 2024-09-27 LAB — PROCALCITONIN: Procalcitonin: 0.41 ng/mL

## 2024-09-27 LAB — PRO BRAIN NATRIURETIC PEPTIDE: Pro Brain Natriuretic Peptide: 5797 pg/mL — ABNORMAL HIGH

## 2024-09-27 LAB — TSH: TSH: 0.396 u[IU]/mL (ref 0.350–4.500)

## 2024-09-27 MED ORDER — DILTIAZEM HCL 60 MG PO TABS
90.0000 mg | ORAL_TABLET | Freq: Three times a day (TID) | ORAL | Status: DC
  Administered 2024-09-27 – 2024-10-03 (×14): 90 mg via ORAL
  Filled 2024-09-27 (×20): qty 1

## 2024-09-27 MED ORDER — LACTATED RINGERS IV SOLN: INTRAVENOUS | Status: DC

## 2024-09-27 MED ORDER — DONEPEZIL HCL 10 MG PO TABS: 10.0000 mg | ORAL_TABLET | Freq: Every day | ORAL | Status: DC

## 2024-09-27 MED ORDER — ACETAMINOPHEN 325 MG PO TABS
650.0000 mg | ORAL_TABLET | Freq: Four times a day (QID) | ORAL | Status: DC | PRN
  Administered 2024-10-01: 650 mg via ORAL
  Filled 2024-09-27: qty 2

## 2024-09-27 MED ORDER — ACETAMINOPHEN 650 MG RE SUPP: 650.0000 mg | Freq: Four times a day (QID) | RECTAL | Status: DC | PRN

## 2024-09-27 MED ORDER — BISACODYL 5 MG PO TBEC: 5.0000 mg | DELAYED_RELEASE_TABLET | Freq: Every day | ORAL | Status: DC | PRN

## 2024-09-27 MED ORDER — SODIUM CHLORIDE 0.9 % IV SOLN
3.0000 g | Freq: Two times a day (BID) | INTRAVENOUS | Status: DC
  Administered 2024-09-27 – 2024-09-28 (×2): 3 g via INTRAVENOUS
  Filled 2024-09-27 (×2): qty 8

## 2024-09-27 MED ORDER — INSULIN GLARGINE 100 UNIT/ML ~~LOC~~ SOLN
15.0000 [IU] | Freq: Two times a day (BID) | SUBCUTANEOUS | Status: DC
  Administered 2024-09-27 – 2024-09-29 (×5): 15 [IU] via SUBCUTANEOUS
  Filled 2024-09-27 (×11): qty 0.15

## 2024-09-27 MED ORDER — INSULIN ASPART 100 UNIT/ML IJ SOLN
0.0000 [IU] | Freq: Every day | INTRAMUSCULAR | Status: DC
  Administered 2024-09-27 – 2024-09-29 (×2): 3 [IU] via SUBCUTANEOUS
  Administered 2024-09-30: 5 [IU] via SUBCUTANEOUS
  Filled 2024-09-27: qty 3
  Filled 2024-09-27: qty 5
  Filled 2024-09-27: qty 3

## 2024-09-27 MED ORDER — APIXABAN 2.5 MG PO TABS
2.5000 mg | ORAL_TABLET | Freq: Two times a day (BID) | ORAL | Status: DC
  Administered 2024-09-27 – 2024-10-03 (×12): 2.5 mg via ORAL
  Filled 2024-09-27 (×13): qty 1

## 2024-09-27 MED ORDER — SODIUM CHLORIDE 0.9 % IV SOLN
2.0000 g | Freq: Once | INTRAVENOUS | Status: AC
  Administered 2024-09-27: 2 g via INTRAVENOUS
  Filled 2024-09-27: qty 20

## 2024-09-27 MED ORDER — HYDRALAZINE HCL 25 MG PO TABS: 25.0000 mg | ORAL_TABLET | Freq: Three times a day (TID) | ORAL | Status: DC

## 2024-09-27 MED ORDER — PANTOPRAZOLE SODIUM 40 MG PO TBEC
40.0000 mg | DELAYED_RELEASE_TABLET | Freq: Every day | ORAL | Status: DC
  Administered 2024-09-27 – 2024-10-03 (×7): 40 mg via ORAL
  Filled 2024-09-27 (×7): qty 1

## 2024-09-27 MED ORDER — ISOSORBIDE MONONITRATE ER 30 MG PO TB24
30.0000 mg | ORAL_TABLET | Freq: Every day | ORAL | Status: DC
  Administered 2024-09-28 – 2024-10-03 (×6): 30 mg via ORAL
  Filled 2024-09-27 (×6): qty 1

## 2024-09-27 MED ORDER — HYDRALAZINE HCL 25 MG PO TABS: 25.0000 mg | ORAL_TABLET | Freq: Every day | ORAL | Status: DC | PRN

## 2024-09-27 MED ORDER — INSULIN ASPART 100 UNIT/ML IJ SOLN
0.0000 [IU] | Freq: Three times a day (TID) | INTRAMUSCULAR | Status: DC
  Administered 2024-09-27: 8 [IU] via SUBCUTANEOUS
  Administered 2024-09-28: 3 [IU] via SUBCUTANEOUS
  Administered 2024-09-28: 8 [IU] via SUBCUTANEOUS
  Administered 2024-09-28 – 2024-09-29 (×2): 11 [IU] via SUBCUTANEOUS
  Administered 2024-09-29: 15 [IU] via SUBCUTANEOUS
  Administered 2024-09-29: 11 [IU] via SUBCUTANEOUS
  Administered 2024-09-30 – 2024-10-01 (×4): 5 [IU] via SUBCUTANEOUS
  Administered 2024-10-01 (×2): 3 [IU] via SUBCUTANEOUS
  Administered 2024-10-02: 2 [IU] via SUBCUTANEOUS
  Administered 2024-10-02: 3 [IU] via SUBCUTANEOUS
  Administered 2024-10-03: 2 [IU] via SUBCUTANEOUS
  Filled 2024-09-27: qty 15
  Filled 2024-09-27: qty 3
  Filled 2024-09-27: qty 2
  Filled 2024-09-27: qty 8
  Filled 2024-09-27 (×3): qty 11
  Filled 2024-09-27: qty 2
  Filled 2024-09-27: qty 9
  Filled 2024-09-27: qty 2
  Filled 2024-09-27: qty 3
  Filled 2024-09-27: qty 5
  Filled 2024-09-27: qty 1

## 2024-09-27 MED ORDER — NITROGLYCERIN 0.4 MG SL SUBL: 0.4000 mg | SUBLINGUAL_TABLET | SUBLINGUAL | Status: DC | PRN

## 2024-09-27 MED ORDER — FUROSEMIDE 10 MG/ML IJ SOLN
40.0000 mg | Freq: Once | INTRAMUSCULAR | Status: AC
  Administered 2024-09-27: 40 mg via INTRAVENOUS
  Filled 2024-09-27: qty 4

## 2024-09-27 MED ORDER — INSULIN ASPART 100 UNIT/ML IJ SOLN
3.0000 [IU] | Freq: Three times a day (TID) | INTRAMUSCULAR | Status: DC
  Administered 2024-09-28 – 2024-09-29 (×3): 3 [IU] via SUBCUTANEOUS
  Filled 2024-09-27 (×3): qty 3

## 2024-09-27 MED ORDER — HYDRALAZINE HCL 20 MG/ML IJ SOLN
10.0000 mg | Freq: Four times a day (QID) | INTRAMUSCULAR | Status: DC | PRN
  Administered 2024-10-01: 10 mg via INTRAVENOUS
  Filled 2024-09-27: qty 1

## 2024-09-27 MED ORDER — MIRABEGRON ER 50 MG PO TB24
50.0000 mg | ORAL_TABLET | Freq: Every day | ORAL | Status: DC
  Administered 2024-09-28 – 2024-10-02 (×5): 50 mg via ORAL
  Filled 2024-09-27 (×7): qty 1

## 2024-09-27 MED ORDER — IPRATROPIUM BROMIDE 0.02 % IN SOLN
0.5000 mg | Freq: Four times a day (QID) | RESPIRATORY_TRACT | Status: DC
  Administered 2024-09-27 – 2024-09-29 (×7): 0.5 mg via RESPIRATORY_TRACT
  Filled 2024-09-27 (×8): qty 2.5

## 2024-09-27 MED ORDER — ALBUTEROL SULFATE (2.5 MG/3ML) 0.083% IN NEBU
2.5000 mg | INHALATION_SOLUTION | RESPIRATORY_TRACT | Status: DC | PRN
  Administered 2024-09-28 – 2024-10-03 (×4): 2.5 mg via RESPIRATORY_TRACT
  Filled 2024-09-27 (×3): qty 3

## 2024-09-27 MED ORDER — LACTATED RINGERS IV BOLUS
500.0000 mL | Freq: Once | INTRAVENOUS | Status: AC
  Administered 2024-09-27: 500 mL via INTRAVENOUS

## 2024-09-27 MED ORDER — CYANOCOBALAMIN 1000 MCG/ML IJ SOLN
1000.0000 ug | INTRAMUSCULAR | Status: DC
  Administered 2024-10-01: 1000 ug via INTRAMUSCULAR
  Filled 2024-09-27: qty 1

## 2024-09-27 MED ORDER — DILTIAZEM HCL 25 MG/5ML IV SOLN: 10.0000 mg | Freq: Four times a day (QID) | INTRAVENOUS | Status: DC | PRN

## 2024-09-27 MED ORDER — TAMSULOSIN HCL 0.4 MG PO CAPS
0.4000 mg | ORAL_CAPSULE | Freq: Every day | ORAL | Status: DC
  Administered 2024-09-27 – 2024-10-02 (×6): 0.4 mg via ORAL
  Filled 2024-09-27 (×6): qty 1

## 2024-09-27 MED ORDER — QUETIAPINE FUMARATE 25 MG PO TABS
25.0000 mg | ORAL_TABLET | Freq: Every day | ORAL | Status: DC
  Administered 2024-09-27 – 2024-10-02 (×6): 25 mg via ORAL
  Filled 2024-09-27 (×6): qty 1

## 2024-09-27 NOTE — Progress Notes (Signed)
 RT instructed patient on the use of a flutter valve. Patient was able to demonstrate back good technique.

## 2024-09-27 NOTE — ED Notes (Signed)
 Pt had soiled diaper. Peri care provided with warm water  and soap. Pt had bowel movement. New diaper and bed pad applied.

## 2024-09-27 NOTE — ED Triage Notes (Addendum)
 Pt bib [facility] from home began feeling weak and dizzy Friday. Fall on Friday, did hit head, does take eliquis . Has had two additional falls since Friday. Increased weakness since falls. Small knot to back of head. LKW Friday. CBG 440s. Temp 100.1. Took 2 nitroglycerins this morning for [facility], denies CP

## 2024-09-27 NOTE — H&P (Addendum)
 "                                                                                                                                                                                                                                                                                                  TRH H&P   Patient Demographics:    Fernando Hess, is a 79 y.o. male  MRN: [id number]   DOB - 08-Aug-1946  Admit Date - 09/27/2024  Outpatient Primary MD for the patient is Okey Carlin Redbird, MD  Patient coming from: Home  Chief Complaint  Patient presents with   Weakness      HPI:    Fernando Hess  is a 79 y.o. male, with history of paroxysmal atrial fibrillation, Chad vasc 2 score of greater than 4 on Eliquis , CAD, chronic diastolic heart failure EF 65%, CKD stage 4 baseline creatinine around 2.3, sleep apnea uses 2 L nasal cannula oxygen at night, early dementia, undiagnosed neuropathy questionable undiagnosed myasthenia gravis follows with neurologist in [place], dyslipidemia, hypertension, right bundle branch block, DM type 2 insulin -dependent, GERD.  With above history lives at home with his wife, according to the wife and patient he has been feeling weak for the last 2-3 days, he has had 2 falls when trying to ambulate over the last 3 days, he hit his head on the side of the furniture 3 days ago as well, this morning while having food he took a big bite that he could not swallow, he choked on it and then threw up, subsequently developed a fever and came to the hospital.  Workup.  Suggestive of aspiration pneumonia with possible early sepsis and I was called to admit the patient.  He is currently denying any headache, no problems swallowing food or liquids chronically except for the 1 episode this morning, no focal weakness, no chest pain, no productive cough, he does have a dry cough since this morning after he aspirated, no abdominal pain, no diarrhea or dysuria, no focal weakness, he has a good  cough reflex.  No exposure to sick contacts or recent  travel.    Review of systems:    A full 10 point Review of Systems was done, except as stated above, all other Review of Systems were negative.   With Past History of the following :    Past Medical History:  Diagnosis Date   Acute renal failure (ARF) 09/24/2015   Acute stress disorder 11/13/2021   Atrial premature complexes    CAD (coronary artery disease) of artery bypass graft    Early occlusion of saphenous vein graft to intermediate and marginal branch in February 2007 following bypass grafting    CAD (coronary artery disease), native coronary artery 2017   hx NSTEMI 09-24-2015  s/p  CABG x5 on 10-02-2015;  post op STEMI inferolateral wall,  SVG OM1 and SVG OM2 occluded, distal OM occlusion the calpruit, treated medically // Myoview  7/21: no ischemia, EF 65, low risk   CHF (congestive heart failure) ([name])    CKD (chronic kidney disease), stage III ([name])    Contusion of right knee 03/16/2020   Dyspnea    Elevated troponin    Erectile dysfunction    Esophageal reflux    History of atrial fibrillation    post op CABG 10-02-2015   History of kidney stones    History of non-ST elevation myocardial infarction (NSTEMI) 09/24/2015   s/p  CABG x5   History of ST elevation myocardial infarction (STEMI) 10/22/2015   inferior wall,  post op CABG 10-02-2015   Hyperlipidemia    Hypertension    Left ureteral stone    Mild atherosclerosis of both carotid arteries    Nephrolithiasis    per CT bilateral non-obstructive calculi   Nocturnal hypoxemia    OSA (obstructive sleep apnea)    Peripheral artery disease    LE Arterial US  01/2019: R PTA and ATA occluded; L ATA occluded   RBBB (right bundle branch block)    Renal atrophy, right    Sleep apnea    wears cpap    ST elevation myocardial infarction (STEMI) of inferior wall ([name]) 10/22/2015   Type 2 diabetes mellitus treated with insulin  ([name])    followed by pcp   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 ([name]) 03/01/2008   Wears glasses       Past Surgical History:  Procedure Laterality Date   APPENDECTOMY  1965   CARDIAC CATHETERIZATION N/A 09/26/2015   Procedure: Left Heart Cath and Coronary Angiography;  Surgeon: Debby DELENA Sor, MD;  Location: [facility] INVASIVE CV LAB;  Service: Cardiovascular;  Laterality: N/A;   CARDIAC CATHETERIZATION N/A 10/22/2015   Procedure: Left Heart Cath and Coronary Angiography;  Surgeon: Ozell Fell, MD;  Location: [facility] INVASIVE CV LAB;  Service: Cardiovascular;  Laterality: N/A;   CATARACT EXTRACTION W/ INTRAOCULAR LENS  IMPLANT, BILATERAL  2017   COLONOSCOPY     CORONARY ARTERY BYPASS GRAFT N/A 10/02/2015   Procedure: CORONARY ARTERY BYPASS GRAFTING (CABG) X5 LIMA-LAD; SVG-DIAG; SVG-OM; SVG-PD; SVG-RAMUS TRANSESOPHAGEAL ECHOCARDIOGRAM (TEE) ENDOSCOPIC GREATER SAPHENOUS VEIN  HARVEST BILAT LE;  Surgeon: Maude Fleeta Ochoa, MD;  Location: [facility] OR;  Service: Open Heart Surgery;  Laterality: N/A;   CYSTOSCOPY/URETEROSCOPY/HOLMIUM LASER/STENT PLACEMENT Left 08/10/2018   Procedure: CYSTOSCOPY/URETEROSCOPY/HOLMIUM LASER/STENT PLACEMENT;  Surgeon: Nieves Cough, MD;  Location: [facility];  Service: Urology;  Laterality: Left;   CYSTOSCOPY/URETEROSCOPY/HOLMIUM LASER/STENT PLACEMENT Left 09/10/2018   Procedure: CYSTOSCOPY/URETEROSCOPY/HOLMIUM LASER/STENT EXCHANGE;  Surgeon: Nieves Cough, MD;  Location: [facility] ORS;  Service: Urology;  Laterality: Left;  HIP ARTHROPLASTY Right 12/26/2022   Procedure: RIGHT HEMI HIP ARTHROPLASTY;  Surgeon: Edna Toribio LABOR, MD;  Location: [facility] OR;  Service: Orthopedics;  Laterality: Right;   LEFT HEART CATHETERIZATION WITH CORONARY ANGIOGRAM N/A 04/13/2014   Procedure: LEFT HEART CATHETERIZATION WITH CORONARY ANGIOGRAM;  Surgeon: Elsie GORMAN Somerset, MD;  Location: [facility] CATH LAB;  Service: Cardiovascular;  Laterality: N/A;   LEG  SURGERY Right age 28   closed reduction leg fracture   NASAL SEPTOPLASTY W/ TURBINOPLASTY Bilateral 08/30/2021   Procedure: NASAL SEPTOPLASTY WITH BILATERAL INFERIOR TURBINATE REDUCTION;  Surgeon: Mable Lenis, MD;  Location: [facility] OR;  Service: ENT;  Laterality: Bilateral;   POLYPECTOMY     RIGHT HEART CATH N/A 06/06/2021   Procedure: RIGHT HEART CATH;  Surgeon: Cherrie Toribio SAUNDERS, MD;  Location: [facility] INVASIVE CV LAB;  Service: Cardiovascular;  Laterality: N/A;   TEE WITHOUT CARDIOVERSION N/A 10/02/2015   Procedure: TRANSESOPHAGEAL ECHOCARDIOGRAM (TEE);  Surgeon: Maude Fleeta Ochoa, MD;  Location: [facility] OR;  Service: Open Heart Surgery;  Laterality: N/A;   URETEROSCOPY WITH HOLMIUM LASER LITHOTRIPSY Bilateral 2004;  2005  dr grapey  @[facility]    VASECTOMY        Social History:     Social History   Tobacco Use   Smoking status: Never   Smokeless tobacco: Never  Substance Use Topics   Alcohol  use: Not Currently      Family History :     Family History  Problem Relation Age of Onset   Heart attack Mother    Heart attack Father    Diabetes Brother    Pancreatic cancer Brother    Diabetes Brother    Colon cancer Neg Hx    Esophageal cancer Neg Hx    Prostate cancer Neg Hx    Rectal cancer Neg Hx    Stomach cancer Neg Hx    Colon polyps Neg Hx       Home Medications:   Prior to Admission medications  Medication Sig Start Date End Date Taking? Authorizing Provider  losartan  (COZAAR ) 25 MG tablet Take 25 mg by mouth at bedtime. 07/28/24  Yes [provider]  amLODipine  (NORVASC ) 5 MG tablet Take 1 tablet (5 mg total) by mouth daily. 07/01/24   Gonfa, Taye T, MD  apixaban  (ELIQUIS ) 2.5 MG TABS tablet Take 1 tablet (2.5 mg total) by mouth 2 (two) times daily. 07/01/24   Kathrin Mignon DASEN, MD  aspirin  EC 81 MG tablet Take 1 tablet (81 mg total) by mouth daily. Swallow whole. 07/01/24   Gonfa, Taye T, MD  calcium  carbonate (TUMS) 500 MG chewable tablet Chew 1 tablet (200 mg of  elemental calcium  total) by mouth 2 (two) times daily. 06/03/24   Patsey Lot, MD  Continuous Glucose Receiver (FREESTYLE LIBRE 3 READER) DEVI USE TO TEST BLOOD SUGAR 03/07/24   [provider]  Continuous Glucose Sensor (FREESTYLE LIBRE 3 PLUS SENSOR) MISC CHANGE EVERY 15 DAYS AS RECOMMENDED BY THE MANUFACTURER 90 DAYS 03/02/24   [provider]  cyanocobalamin  (VITAMIN B12) 1000 MCG/ML injection Inject 1,000 mcg into the muscle once a week. Inject on Saturday    [provider]  donepezil  (ARICEPT ) 10 MG tablet Take 10 mg by mouth at bedtime.    [provider]  Evolocumab  (REPATHA  SURECLICK) 140 MG/ML SOAJ Inject 140 mg into the skin every 14 (fourteen) days. 08/03/24   Lucien Orren SAILOR, [name]  furosemide  (LASIX ) 40 MG tablet Take 1 tablet (40 mg total) by mouth 3 (three)  times a week. Take one tablet (40mg ) by mouth three times weekly on Monday, Wednesday, and Friday. Patient taking differently: Take 40 mg by mouth 3 (three) times a week. Take one tablet (40mg ) by mouth three times weekly on Monday, Wednesday, and Friday.occasionally daily 03/04/24   Patsy Lenis, MD  hydrALAZINE  (APRESOLINE ) 25 MG tablet Take 1 tablet (25 mg total) by mouth daily as needed (Hypertension). Take one tablet (25mg ) by mouth daily as needed if SBP is >160 03/04/24   Patsy Lenis, MD  insulin  lispro (HUMALOG ) 100 UNIT/ML KwikPen Inject 8 Units into the skin 3 (three) times daily before meals. 05/15/23   [provider]  lamoTRIgine  (LAMICTAL ) 25 MG tablet Take 25 mg by mouth daily. 02/18/24   [provider]  LANTUS  SOLOSTAR 100 UNIT/ML Solostar Pen Inject 36 Units into the skin daily before breakfast. 02/19/23   [provider]  latanoprost  (XALATAN ) 0.005 % ophthalmic solution Place 1 drop into both eyes at bedtime.    [provider]  mirabegron  ER (MYRBETRIQ ) 50 MG TB24 tablet Take 50 mg by mouth at bedtime. 06/08/24   [provider]   nitroGLYCERIN  (NITROSTAT ) 0.4 MG SL tablet Place 1 tablet (0.4 mg total) under the tongue every 5 (five) minutes as needed for chest pain. 08/12/24 08/12/25  Meng, Hao, PA  ondansetron  (ZOFRAN -ODT) 4 MG disintegrating tablet Take 1 tablet (4 mg total) by mouth every 8 (eight) hours as needed. 03/06/24   Melvenia Motto, MD  pantoprazole  (PROTONIX ) 40 MG tablet TAKE 1 TABLET BY MOUTH EVERY DAY 11/25/19   Abran Norleen SAILOR, MD  QUEtiapine  (SEROQUEL ) 25 MG tablet Take 1 tablet (25 mg total) by mouth at bedtime. 07/01/24   Gonfa, Taye T, MD  tamsulosin  (FLOMAX ) 0.4 MG CAPS capsule Take 0.4 mg by mouth daily. 01/10/24   [provider]  TRADJENTA  5 MG TABS tablet Take 5 mg by mouth daily.    [provider]     Allergies:    Allergies[1]   Physical Exam:   Vitals  Blood pressure (!) 152/77, pulse 70, temperature 98.7 F (37.1 C), temperature source Oral, resp. rate (!) 22, height 5' 6 (1.676 m), SpO2 94%.   1. General elderly male in bed, in no distress,  2. Normal affect and insight, Not Suicidal or Homicidal, Awake Alert,   3. No F.N deficits, ALL C.Nerves Intact, Strength 5/5 all 4 extremities, Sensation intact all 4 extremities, Plantars down going.  4. Ears and Eyes appear Normal, Conjunctivae clear, PERRLA. Moist Oral Mucosa.  5. Supple Neck, No JVD, No cervical lymphadenopathy appriciated, No Carotid Bruits.  6. Symmetrical Chest wall movement, Good air movement bilaterally, R.sided rales  7. RRR, No Gallops, Rubs or Murmurs, No Parasternal Heave.  8. Positive Bowel Sounds, Abdomen Soft, No tenderness, No organomegaly appriciated,No rebound -guarding or rigidity.  9.  No Cyanosis, Normal Skin Turgor, No Skin Rash or Bruise.  10. Good muscle tone,  joints appear normal , no effusions, Normal ROM.  11. No Palpable Lymph Nodes in Neck or Axillae     Data Review:   Recent Labs  Lab 09/27/24 1035 09/27/24 1100  WBC 18.1*  --   HGB 10.4* 10.5*  HCT 32.8*  31.0*  PLT 200  --   MCV 86.3  --   MCH 27.4  --   MCHC 31.7  --   RDW 17.1*  --   LYMPHSABS 0.6*  --   MONOABS 1.6*  --   EOSABS 0.0  --  BASOSABS 0.1  --     Recent Labs  Lab 09/27/24 1035 09/27/24 1048 09/27/24 1100  NA 139  --  140  K 4.2  --  4.2  CL 107  --  107  CO2 21*  --   --   ANIONGAP 11  --   --   GLUCOSE 339*  --  336*  BUN 34*  --  40*  CREATININE 2.16*  --  2.20*  AST 17  --   --   ALT 8  --   --   ALKPHOS 105  --   --   BILITOT 1.1  --   --   ALBUMIN  3.1*  --   --   LATICACIDVEN  --  1.6  --   INR 2.2*  --   --   CALCIUM  7.5*  --   --     Lab Results  Component Value Date   CHOL 171 06/29/2024   HDL 28 (L) 06/29/2024   LDLCALC 123 (H) 06/29/2024   TRIG 100 06/29/2024   CHOLHDL 6.1 06/29/2024    Recent Labs  Lab 09/27/24 1035 09/27/24 1048  LATICACIDVEN  --  1.6  INR 2.2*  --   CALCIUM  7.5*  --     Recent Labs  Lab 09/27/24 1035 09/27/24 1048 09/27/24 1100  WBC 18.1*  --   --   PLT 200  --   --   LATICACIDVEN  --  1.6  --   CREATININE 2.16*  --  2.20*    Urinalysis    Component Value Date/Time   COLORURINE YELLOW 06/29/2024 1330   APPEARANCEUR CLEAR 06/29/2024 1330   LABSPEC 1.021 06/29/2024 1330   PHURINE 5.0 06/29/2024 1330   GLUCOSEU >=500 (A) 06/29/2024 1330   HGBUR SMALL (A) 06/29/2024 1330   BILIRUBINUR NEGATIVE 06/29/2024 1330   KETONESUR NEGATIVE 06/29/2024 1330   PROTEINUR 100 (A) 06/29/2024 1330   UROBILINOGEN 0.2 10/02/2009 1604   NITRITE NEGATIVE 06/29/2024 1330   LEUKOCYTESUR TRACE (A) 06/29/2024 1330      Imaging Results:    CT CHEST ABDOMEN PELVIS WO CONTRAST Result Date: 09/27/2024 CLINICAL DATA:  Weakness, dizziness, fall. EXAM: CT CHEST, ABDOMEN AND PELVIS WITHOUT CONTRAST TECHNIQUE: Multidetector CT imaging of the chest, abdomen and pelvis was performed following the standard protocol without IV contrast. RADIATION DOSE REDUCTION: This exam was performed according to the departmental  dose-optimization program which includes automated exposure control, adjustment of the mA and/or kV according to patient size and/or use of iterative reconstruction technique. COMPARISON:  March 06, 2024. FINDINGS: CT CHEST FINDINGS Cardiovascular: Status post coronary artery bypass graft. Mild cardiomegaly. No pericardial effusion. No definite thoracic aortic aneurysm. Mediastinum/Nodes: No enlarged mediastinal, hilar, or axillary lymph nodes. Thyroid  gland, trachea, and esophagus demonstrate no significant findings. Lungs/Pleura: No pneumothorax is noted. Minimal right pleural effusion is noted. Airspace opacity is noted posteriorly in right upper lobe concerning for possible pneumonia. Smaller opacity is also noted in left upper lobe concerning for possible pneumonia. Musculoskeletal: No chest wall mass or suspicious bone lesions identified. CT ABDOMEN PELVIS FINDINGS Hepatobiliary: No focal liver abnormality is seen. No gallstones, gallbladder wall thickening, or biliary dilatation. Pancreas: Unremarkable. No pancreatic ductal dilatation or surrounding inflammatory changes. Spleen: Normal in size without focal abnormality. Adrenals/Urinary Tract: Adrenal glands appear normal. Small nonobstructive right renal calculus is noted. 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 Left nephrolithiasis is noted as well. No definite hydronephrosis or renal obstruction is noted.  Urinary bladder is unremarkable. Stomach/Bowel: Status post appendectomy. Wall thickening of proximal stomach is noted which may represent lack of distension, but gastritis cannot be excluded. There is no evidence of bowel obstruction. Mild to moderate amount of stool seen in the sigmoid colon and rectum. Vascular/Lymphatic: Aortic atherosclerosis. No enlarged abdominal or pelvic lymph nodes. Reproductive: Prostate is unremarkable. Other: No abdominal wall hernia or abnormality. No abdominopelvic ascites. Musculoskeletal: Status post  right total hip arthroplasty. No acute osseous abnormality is noted. IMPRESSION: 1. Airspace opacity is noted posteriorly in right upper lobe concerning for pneumonia. Smaller opacity is noted in left upper lobe as well. 2. Minimal right pleural effusion is noted. 3. Wall thickening of proximal stomach is noted which may represent lack of distension, but gastritis cannot be excluded. 4. Bilateral nonobstructive nephrolithiasis. 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 5. Aortic atherosclerosis. Aortic Atherosclerosis (ICD10-I70.0). Electronically Signed   By: Lynwood Landy Raddle M.D.   On: 09/27/2024 13:09   DG Chest 2 View Result Date: 09/27/2024 CLINICAL DATA:  Shortness of breath with recent fall. EXAM: CHEST - 2 VIEW COMPARISON:  06/30/2024 FINDINGS: Lungs are hypoinflated without focal airspace consolidation or effusion. Cardiomediastinal silhouette and remainder of the exam is unchanged. IMPRESSION: Hypoinflation without acute cardiopulmonary disease. Electronically Signed   By: Toribio Agreste M.D.   On: 09/27/2024 11:41   CT Head Wo Contrast Result Date: 09/27/2024 EXAM: CT HEAD WITHOUT CONTRAST 09/27/2024 10:39:00 AM TECHNIQUE: CT of the head was performed without the administration of intravenous contrast. Automated exposure control, iterative reconstruction, and/or weight based adjustment of the mA/kV was utilized to reduce the radiation dose to as low as reasonably achievable. COMPARISON: CT of the head dated 07/15/2023. CLINICAL HISTORY: The patient experienced minor head trauma and is aged 1 years or older. FINDINGS: BRAIN AND VENTRICLES: No acute hemorrhage. No evidence of acute infarct. Age-related atrophy. Mild periventricular white matter disease. Remote lacunar infarcts within right basal ganglia. No hydrocephalus. No extra-axial collection. No mass effect or midline shift. ORBITS: Status post bilateral lens replacement. SINUSES: Polypoid mucosal thickening within right  maxillary sinus. SOFT TISSUES AND SKULL: No acute soft tissue abnormality. No skull fracture. Moderate vascular calcifications. IMPRESSION: 1. No acute intracranial abnormality related to the minor head trauma. 2. Age-related atrophy, mild periventricular white matter disease, and remote lacunar infarcts within the right basal ganglia. Electronically signed by: Evalene Coho MD MD 09/27/2024 10:58 AM EST RP Workstation: [id number]    My personal review of EKG: Rhythm A.flutter, Rate  70 /min, no acute ST changes.   Assessment & Plan:   1.  Aspiration pneumonia after an episode of eating a big bite of food during breakfast, choking on it and throwing up.  He will be admitted to the hospital, currently does not appear toxic, IV fluid bolus given, continue maintenance IV fluid, blood cultures, MRSA nasal PCR, extended viral panel as he was weak for the last 2 days prior to this episode, Unasyn  IV, speech therapy input, aspiration precautions and feeding assistance.  Supplemental oxygen 2 L for now 24/7 of note he uses 2 L at night.  Nebulizer treatments as needed.  Encouraged to sit in chair use I-S and flutter valve for pulmonary toiletry.  Follow cultures and follow clinically.  2.  Paroxysmal atrial fibrillation/a flutter with Chad vas 2 score of greater than 3.  For now we will place him on oral Cardizem  along with Eliquis  at home dose, monitor on telemetry.  3.  CAD.  No acute issues.  Continue Eliquis , no chest pain no EKG changes.  No acute issues.  Troponin mildly elevated due to the stress of infection, trend is flat and not ACS pattern.  4.  Hypertension.  Placed on combination of Cardizem , Imdur , hydralazine  and as needed hydralazine  IV.  5.  Chronic diastolic CHF EF 65%.  Currently appears dry.  Gentle hydration, skip Eliquis  today.  Readdress fluid and diuretic need tomorrow.  6.  Undiagnosed peripheral neuropathy undiagnosed myasthenia gravis.  No confirmatory diagnosis yet, no acute  issues, PT OT, monitor NIF, SLP follow-up, postdischarge follow-up with his neurologist in [place].  7.  Early dementia.  Continue Aricept  along with Seroquel  nighttime.  At risk for delirium.  8.  OSA.  Uses nighttime oxygen.  Continue here.  9.  DM type II.  Continue Lantus  along with subcu insulin , check A1c.  10.  BPH.  Continue combination of Flomax  along with mirabegron  ER.  11.  Falls at home.  Due to underlying weakness and deconditioning, PT OT, head CT unremarkable, no headache or focal deficits.  Monitor with supportive care, check TSH, Free T4, B12.  12.  CKD 4.  Creatinine close to baseline.  Monitor.    DVT Prophylaxis Eliquis   AM Labs Ordered, also please review Full Orders  Family Communication: Admission, patients condition and plan of care including tests being ordered have been discussed with the patient and wife who indicate understanding and agree with the plan and Code Status.  Code Status Full  Likely DC to  TBD  Condition GUARDED    Consults called: None    Admission status: Inpt    Time spent in minutes : 45  Signature  -    Lavada Stank M.D on 09/27/2024 at 3:16 PM   -  To page go to [email or web address]             [1]  Allergies Allergen Reactions   Cilostazol  Swelling and Other (See Comments)    Edema    Magnesium -Containing Compounds Other (See Comments)    Avoid magnesium  with hx myasthenia gravis (can worsen muscle weakness and trigger a severe exacerbation)   Statins Other (See Comments)    Makes myasthenia gravis worse or flare up   Tirzepatide Other (See Comments)    Unknown    Dulaglutide Nausea And Vomiting and Other (See Comments)    TRULICITY   Levofloxacin Hives, Itching and Rash   Liraglutide Other (See Comments)    Severe fatigue & insomnia   Lisinopril Itching, Rash and Cough   "

## 2024-09-27 NOTE — ED Notes (Signed)
 Pt assisted with use of urinal. Pt unable to urinate much, sample sent to lab. Pt cleaned and changed into new brief. Bedding also changed

## 2024-09-27 NOTE — ED Notes (Signed)
 CCMD called.

## 2024-09-27 NOTE — ED Provider Notes (Signed)
 " [place] EMERGENCY DEPARTMENT AT [facility] Provider Note   CSN: [id number] Arrival date & time: 09/27/24  1014     Patient presents with: Weakness   Fernando Hess is a 79 y.o. male.   Weakness Patient is an 79 year old male presenting ED today for concerns for shortness of breath with additionally wife noting per EMS that he has been feeling weak and lightheaded since Friday.  Notably did hit posterior head after fall on Friday.  Wife reports that last known normal was Friday afternoon.  Reporting that she noticed some mild facial drooping and slurred speech.  With EMS noting that they had not noted any slurred speech and had a normal stroke examination.  Previous medical history of HTN, CAD, status post CABG x 5, HFrEF, PAD, paroxysmal atrial fibrillation currently on Eliquis , aortic stenosis, CKD, dementia, diabetes, prolonged QT, myasthenia gravis, stroke.  In wheelchair at baseline.    Denies fever, headache, vision changes, blurry vision, vertigo, chest pain, cough, congestion, abdominal pain, nausea, vomiting, diarrhea, melena, hematochezia, dysuria, hematuria, rashes, lower leg swelling.    Prior to Admission medications  Medication Sig Start Date End Date Taking? Authorizing Provider  losartan  (COZAAR ) 25 MG tablet Take 25 mg by mouth at bedtime. 07/28/24  Yes [provider]  amLODipine  (NORVASC ) 5 MG tablet Take 1 tablet (5 mg total) by mouth daily. 07/01/24   Gonfa, Taye T, MD  apixaban  (ELIQUIS ) 2.5 MG TABS tablet Take 1 tablet (2.5 mg total) by mouth 2 (two) times daily. 07/01/24   Kathrin Mignon DASEN, MD  aspirin  EC 81 MG tablet Take 1 tablet (81 mg total) by mouth daily. Swallow whole. 07/01/24   Gonfa, Taye T, MD  calcium  carbonate (TUMS) 500 MG chewable tablet Chew 1 tablet (200 mg of elemental calcium  total) by mouth 2 (two) times daily. 06/03/24   Patsey Lot, MD  Continuous Glucose Receiver (FREESTYLE LIBRE 3 READER) DEVI USE TO TEST BLOOD  SUGAR 03/07/24   [provider]  Continuous Glucose Sensor (FREESTYLE LIBRE 3 PLUS SENSOR) MISC CHANGE EVERY 15 DAYS AS RECOMMENDED BY THE MANUFACTURER 90 DAYS 03/02/24   [provider]  cyanocobalamin  (VITAMIN B12) 1000 MCG/ML injection Inject 1,000 mcg into the muscle once a week. Inject on Saturday    [provider]  donepezil  (ARICEPT ) 10 MG tablet Take 10 mg by mouth at bedtime.    [provider]  Evolocumab  (REPATHA  SURECLICK) 140 MG/ML SOAJ Inject 140 mg into the skin every 14 (fourteen) days. 08/03/24   Lucien Orren SAILOR, [name]  furosemide  (LASIX ) 40 MG tablet Take 1 tablet (40 mg total) by mouth 3 (three) times a week. Take one tablet (40mg ) by mouth three times weekly on Monday, Wednesday, and Friday. Patient taking differently: Take 40 mg by mouth 3 (three) times a week. Take one tablet (40mg ) by mouth three times weekly on Monday, Wednesday, and Friday.occasionally daily 03/04/24   Patsy Lenis, MD  hydrALAZINE  (APRESOLINE ) 25 MG tablet Take 1 tablet (25 mg total) by mouth daily as needed (Hypertension). Take one tablet (25mg ) by mouth daily as needed if SBP is >160 03/04/24   Patsy Lenis, MD  insulin  lispro (HUMALOG ) 100 UNIT/ML KwikPen Inject 8 Units into the skin 3 (three) times daily before meals. 05/15/23   [provider]  lamoTRIgine  (LAMICTAL ) 25 MG tablet Take 25 mg by mouth daily. 02/18/24   [provider]  LANTUS  SOLOSTAR 100 UNIT/ML Solostar Pen Inject 36 Units into the skin daily  before breakfast. 02/19/23   [provider]  latanoprost  (XALATAN ) 0.005 % ophthalmic solution Place 1 drop into both eyes at bedtime.    [provider]  mirabegron  ER (MYRBETRIQ ) 50 MG TB24 tablet Take 50 mg by mouth at bedtime. 06/08/24   [provider]  nitroGLYCERIN  (NITROSTAT ) 0.4 MG SL tablet Place 1 tablet (0.4 mg total) under the tongue every 5 (five) minutes as needed for chest pain. 08/12/24 08/12/25  Meng, Hao, PA   ondansetron  (ZOFRAN -ODT) 4 MG disintegrating tablet Take 1 tablet (4 mg total) by mouth every 8 (eight) hours as needed. 03/06/24   Melvenia Motto, MD  pantoprazole  (PROTONIX ) 40 MG tablet TAKE 1 TABLET BY MOUTH EVERY DAY 11/25/19   Abran Norleen SAILOR, MD  QUEtiapine  (SEROQUEL ) 25 MG tablet Take 1 tablet (25 mg total) by mouth at bedtime. 07/01/24   Gonfa, Taye T, MD  tamsulosin  (FLOMAX ) 0.4 MG CAPS capsule Take 0.4 mg by mouth daily. 01/10/24   [provider]  TRADJENTA  5 MG TABS tablet Take 5 mg by mouth daily.    [provider]    Allergies: Cilostazol , Magnesium -containing compounds, Statins, Tirzepatide, Dulaglutide, Levofloxacin, Liraglutide, and Lisinopril    Review of Systems  Neurological:  Positive for speech difficulty and weakness.  All other systems reviewed and are negative.   Updated Vital Signs BP (!) 153/66   Pulse 72   Temp 98.7 F (37.1 C) (Oral)   Resp (!) 39   Ht 5' 6 (1.676 m)   SpO2 92%   BMI 30.67 kg/m   Physical Exam Vitals and nursing note reviewed.  Constitutional:      General: He is not in acute distress.    Appearance: Normal appearance. He is not ill-appearing or diaphoretic.  HENT:     Head: Normocephalic and atraumatic.  Eyes:     General: No scleral icterus.       Right eye: No discharge.        Left eye: No discharge.     Extraocular Movements: Extraocular movements intact.     Conjunctiva/sclera: Conjunctivae normal.     Pupils: Pupils are equal, round, and reactive to light.  Cardiovascular:     Rate and Rhythm: Normal rate. Rhythm irregular.     Pulses: Normal pulses.     Heart sounds: Normal heart sounds. No murmur heard.    No friction rub. No gallop.  Pulmonary:     Breath sounds: No stridor. Rales (Right upper lung.) present. No wheezing or rhonchi.     Comments: Notably is tachypneic, having conversational dyspnea. Chest:     Chest wall: No tenderness.  Abdominal:     General: Abdomen is flat. There is no  distension.     Palpations: Abdomen is soft.     Tenderness: There is no abdominal tenderness. There is no right CVA tenderness, left CVA tenderness, guarding or rebound.  Musculoskeletal:        General: No swelling, deformity or signs of injury.     Cervical back: Normal range of motion. No rigidity.     Right lower leg: No edema.     Left lower leg: No edema.  Skin:    General: Skin is warm and dry.     Findings: No bruising, erythema or lesion.  Neurological:     General: No focal deficit present.     Mental Status: He is alert and oriented to person, place, and time. Mental status is at baseline.  Sensory: No sensory deficit.     Motor: No weakness.     Comments: Notably does have mild left-sided facial drooping.  Patient says that this is his baseline.  no ataxia, no apraxia, no aphasia, no arm drift, normal coordination with finger-to-nose, normal sensation to both upper and lower extremities bilaterally, normal grip strength bilaterally, normal strength to both flexion and extension to both upper lower extremities 5+ bilaterally, no visual field deficits, no nystagmus.     Psychiatric:        Mood and Affect: Mood normal.     (all labs ordered are listed, but only abnormal results are displayed) Labs Reviewed  CBC WITH DIFFERENTIAL/PLATELET - Abnormal; Notable for the following components:      Result Value   WBC 18.1 (*)    RBC 3.80 (*)    Hemoglobin 10.4 (*)    HCT 32.8 (*)    RDW 17.1 (*)    Neutro Abs 15.4 (*)    Lymphs Abs 0.6 (*)    Monocytes Absolute 1.6 (*)    Abs Immature Granulocytes 0.31 (*)    All other components within normal limits  COMPREHENSIVE METABOLIC PANEL WITH GFR - Abnormal; Notable for the following components:   CO2 21 (*)    Glucose, Bld 339 (*)    BUN 34 (*)    Creatinine, Ser 2.16 (*)    Calcium  7.5 (*)    Total Protein 5.7 (*)    Albumin  3.1 (*)    GFR, Estimated 30 (*)    All other components within normal limits  PROTIME-INR  - Abnormal; Notable for the following components:   Prothrombin Time 25.5 (*)    INR 2.2 (*)    All other components within normal limits  PRO BRAIN NATRIURETIC PEPTIDE - Abnormal; Notable for the following components:   Pro Brain Natriuretic Peptide 5,797.0 (*)    All other components within normal limits  I-STAT CHEM 8, ED - Abnormal; Notable for the following components:   BUN 40 (*)    Creatinine, Ser 2.20 (*)    Glucose, Bld 336 (*)    Calcium , Ion 0.99 (*)    Hemoglobin 10.5 (*)    HCT 31.0 (*)    All other components within normal limits  CBG MONITORING, ED - Abnormal; Notable for the following components:   Glucose-Capillary 323 (*)    All other components within normal limits  TROPONIN T, HIGH SENSITIVITY - Abnormal; Notable for the following components:   Troponin T High Sensitivity 73 (*)    All other components within normal limits  TROPONIN T, HIGH SENSITIVITY - Abnormal; Notable for the following components:   Troponin T High Sensitivity 73 (*)    All other components within normal limits  RESP PANEL BY RT-PCR (RSV, FLU A&B, COVID)  RVPGX2  CULTURE, BLOOD (ROUTINE X 2)  CULTURE, BLOOD (ROUTINE X 2)  MRSA NEXT GEN BY PCR, NASAL  LIPASE, BLOOD  URINALYSIS, ROUTINE W REFLEX MICROSCOPIC  PROCALCITONIN  C-REACTIVE PROTEIN  I-STAT CG4 LACTIC ACID, ED    EKG: EKG Interpretation Date/Time:  Tuesday September 27 2024 10:34:53 EST Ventricular Rate:  70 PR Interval:    QRS Duration:  146 QT Interval:  517 QTC Calculation: 558 R Axis:   25  Text Interpretation: Atrial flutter with varied AV block, Right bundle branch block Confirmed by Garrick Charleston [phone]) on 09/27/2024 11:06:38 AM  Radiology: CT CHEST ABDOMEN PELVIS WO CONTRAST Result Date: 09/27/2024 CLINICAL DATA:  Weakness, dizziness, fall.  EXAM: CT CHEST, ABDOMEN AND PELVIS WITHOUT CONTRAST TECHNIQUE: Multidetector CT imaging of the chest, abdomen and pelvis was performed following the standard protocol without  IV contrast. RADIATION DOSE REDUCTION: This exam was performed according to the departmental dose-optimization program which includes automated exposure control, adjustment of the mA and/or kV according to patient size and/or use of iterative reconstruction technique. COMPARISON:  March 06, 2024. FINDINGS: CT CHEST FINDINGS Cardiovascular: Status post coronary artery bypass graft. Mild cardiomegaly. No pericardial effusion. No definite thoracic aortic aneurysm. Mediastinum/Nodes: No enlarged mediastinal, hilar, or axillary lymph nodes. Thyroid  gland, trachea, and esophagus demonstrate no significant findings. Lungs/Pleura: No pneumothorax is noted. Minimal right pleural effusion is noted. Airspace opacity is noted posteriorly in right upper lobe concerning for possible pneumonia. Smaller opacity is also noted in left upper lobe concerning for possible pneumonia. Musculoskeletal: No chest wall mass or suspicious bone lesions identified. CT ABDOMEN PELVIS FINDINGS Hepatobiliary: No focal liver abnormality is seen. No gallstones, gallbladder wall thickening, or biliary dilatation. Pancreas: Unremarkable. No pancreatic ductal dilatation or surrounding inflammatory changes. Spleen: Normal in size without focal abnormality. Adrenals/Urinary Tract: Adrenal glands appear normal. Small nonobstructive right renal calculus is noted. 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 Left nephrolithiasis is noted as well. No definite hydronephrosis or renal obstruction is noted. Urinary bladder is unremarkable. Stomach/Bowel: Status post appendectomy. Wall thickening of proximal stomach is noted which may represent lack of distension, but gastritis cannot be excluded. There is no evidence of bowel obstruction. Mild to moderate amount of stool seen in the sigmoid colon and rectum. Vascular/Lymphatic: Aortic atherosclerosis. No enlarged abdominal or pelvic lymph nodes. Reproductive: Prostate is unremarkable. Other: No  abdominal wall hernia or abnormality. No abdominopelvic ascites. Musculoskeletal: Status post right total hip arthroplasty. No acute osseous abnormality is noted. IMPRESSION: 1. Airspace opacity is noted posteriorly in right upper lobe concerning for pneumonia. Smaller opacity is noted in left upper lobe as well. 2. Minimal right pleural effusion is noted. 3. Wall thickening of proximal stomach is noted which may represent lack of distension, but gastritis cannot be excluded. 4. Bilateral nonobstructive nephrolithiasis. 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 5. Aortic atherosclerosis. Aortic Atherosclerosis (ICD10-I70.0). Electronically Signed   By: Lynwood Landy Raddle M.D.   On: 09/27/2024 13:09   DG Chest 2 View Result Date: 09/27/2024 CLINICAL DATA:  Shortness of breath with recent fall. EXAM: CHEST - 2 VIEW COMPARISON:  06/30/2024 FINDINGS: Lungs are hypoinflated without focal airspace consolidation or effusion. Cardiomediastinal silhouette and remainder of the exam is unchanged. IMPRESSION: Hypoinflation without acute cardiopulmonary disease. Electronically Signed   By: Toribio Agreste M.D.   On: 09/27/2024 11:41   CT Head Wo Contrast Result Date: 09/27/2024 EXAM: CT HEAD WITHOUT CONTRAST 09/27/2024 10:39:00 AM TECHNIQUE: CT of the head was performed without the administration of intravenous contrast. Automated exposure control, iterative reconstruction, and/or weight based adjustment of the mA/kV was utilized to reduce the radiation dose to as low as reasonably achievable. COMPARISON: CT of the head dated 07/15/2023. CLINICAL HISTORY: The patient experienced minor head trauma and is aged 71 years or older. FINDINGS: BRAIN AND VENTRICLES: No acute hemorrhage. No evidence of acute infarct. Age-related atrophy. Mild periventricular white matter disease. Remote lacunar infarcts within right basal ganglia. No hydrocephalus. No extra-axial collection. No mass effect or midline shift. ORBITS:  Status post bilateral lens replacement. SINUSES: Polypoid mucosal thickening within right maxillary sinus. SOFT TISSUES AND SKULL: No acute soft tissue  abnormality. No skull fracture. Moderate vascular calcifications. IMPRESSION: 1. No acute intracranial abnormality related to the minor head trauma. 2. Age-related atrophy, mild periventricular white matter disease, and remote lacunar infarcts within the right basal ganglia. Electronically signed by: Evalene Coho MD MD 09/27/2024 10:58 AM EST RP Workstation: [id number]    .Critical Care  Performed by: Beola Terrall RAMAN, [name] Authorized by: Beola Terrall RAMAN, [name]   Critical care provider statement:    Critical care time (minutes):  32   Critical care was necessary to treat or prevent imminent or life-threatening deterioration of the following conditions:  Sepsis   Critical care was time spent personally by me on the following activities:  Development of treatment plan with patient or surrogate, discussions with consultants, evaluation of patient's response to treatment, examination of patient, ordering and review of laboratory studies, ordering and review of radiographic studies, ordering and performing treatments and interventions, pulse oximetry, re-evaluation of patient's condition, review of old charts and obtaining history from patient or surrogate   I assumed direction of critical care for this patient from another provider in my specialty: yes     Care discussed with: admitting provider      Medications Ordered in the ED  hydrALAZINE  (APRESOLINE ) injection 10 mg (has no administration in time range)  apixaban  (ELIQUIS ) tablet 2.5 mg (has no administration in time range)  cyanocobalamin  (VITAMIN B12) injection 1,000 mcg (has no administration in time range)  donepezil  (ARICEPT ) tablet 10 mg (has no administration in time range)  insulin  glargine-yfgn (SEMGLEE ) injection 15 Units (has no administration in time range)  nitroGLYCERIN   (NITROSTAT ) SL tablet 0.4 mg (has no administration in time range)  mirabegron  ER (MYRBETRIQ ) tablet 50 mg (has no administration in time range)  tamsulosin  (FLOMAX ) capsule 0.4 mg (has no administration in time range)  pantoprazole  (PROTONIX ) EC tablet 40 mg (has no administration in time range)  QUEtiapine  (SEROQUEL ) tablet 25 mg (has no administration in time range)  diltiazem  (CARDIZEM ) tablet 90 mg (has no administration in time range)  diltiazem  (CARDIZEM ) injection 10 mg (has no administration in time range)  insulin  aspart (novoLOG ) injection 0-15 Units (has no administration in time range)  insulin  aspart (novoLOG ) injection 0-5 Units (has no administration in time range)  isosorbide  mononitrate (IMDUR ) 24 hr tablet 30 mg (has no administration in time range)  lactated ringers  infusion (has no administration in time range)  hydrALAZINE  (APRESOLINE ) tablet 25 mg (has no administration in time range)  insulin  aspart (novoLOG ) injection 3 Units (has no administration in time range)  bisacodyl  (DULCOLAX) EC tablet 5 mg (has no administration in time range)  acetaminophen  (TYLENOL ) tablet 650 mg (has no administration in time range)    Or  acetaminophen  (TYLENOL ) suppository 650 mg (has no administration in time range)  ipratropium (ATROVENT ) nebulizer solution 0.5 mg (has no administration in time range)  albuterol  (PROVENTIL ) (2.5 MG/3ML) 0.083% nebulizer solution 2.5 mg (has no administration in time range)  Ampicillin -Sulbactam (UNASYN ) 3 g in sodium chloride  0.9 % 100 mL IVPB (has no administration in time range)  furosemide  (LASIX ) injection 40 mg (40 mg Intravenous Given 09/27/24 1343)  cefTRIAXone  (ROCEPHIN ) 2 g in sodium chloride  0.9 % 100 mL IVPB (0 g Intravenous Stopped 09/27/24 1411)  lactated ringers  bolus 500 mL (0 mLs Intravenous Stopped 09/27/24 1437)     Medical Decision Making Amount and/or Complexity of Data Reviewed Labs: ordered. Radiology: ordered. ECG/medicine  tests: ordered.  Risk Prescription drug management. Decision regarding hospitalization.  This patient  is a 79 year old male who presents to the ED for concern of shortness of breath, noting to have fallen 4 days ago coming posterior aspect of head on blood thinners and was never evaluated, noted to then progressively weak and all extremities since incident, noted also be worsened shortness of breath today for which wife sent patient to the emergency department for reevaluation..  Patient notes that he has had sputum change and worsening shortness of breath.  But is not having pain anywhere.  Concern for possible dehydration as well.  On physical exam, patient is in no acute distress, afebrile, alert and orient x 4, speaking in full sentences, nontachycardic.  Notably was mildly conversationally dyspneic, and mildly tachypneic on evaluation.  Noted to have some mild rales to left lung.  With no lower leg edema noted.  Initially starting some furosemide  with patient's shortness of breath considered to be secondary to heart failure with elevated BNP.  Patient on room air at 92%.  With only being tachypneic, and elevated white count.  Patient did have SIRS positive criteria.  Patient was then underwent CT scan of chest and abdomen to evaluate source of possible infection.  For when he then noted to have opacities concerning for pneumonia in right upper and left upper lobes.  Patient was started on Rocephin , with consulting with pharmacy and noted that he did not need to undergo additional doxycycline or azithromycin with patient's QTc prolongation and history of myasthenia gravis.  Blood cultures are pending.  Will seek to admit for sepsis now that he is now meeting sepsis criteria.  Patient was then provided some fluid at that time.  Patient care was transferred to Dr. Dennise with hospitalist.  Differential diagnoses prior to evaluation: The emergent differential diagnosis includes, but is not limited  to, pneumonia, ACS, heart failure exacerbation, PE, CVA. This is not an exhaustive differential.   Past Medical History / Co-morbidities / Social History: HTN, CAD, status post CABG x 5, HFrEF, PAD, paroxysmal atrial fibrillation currently on Eliquis , aortic stenosis, CKD, dementia, diabetes, prolonged QT, myasthenia gravis, stroke.  Additional history: Chart reviewed. Pertinent results include:   Noted to have been admitted on 06/28/2024 for shortness of breath requiring BiPAP.  Had full CVA workup at that time.  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  Lab Tests/Imaging studies: I personally interpreted labs/imaging and the pertinent results include:    CBC noted elevated white count of 18.1 and decreased hemoglobin 10.4 CMP notes a hypocalcemia of 7.5 and an elevated glucose of 339 as well as a decreased GFR of 30.  PT/INR both elevated I-STAT lactic acid was negative Magnesium  pending Lipase unremarkable Troponin elevated at 73 likely secondary to demand proBNP elevated at 5797 Rester panel negative CT chest Abdo pelvis notes bilateral upper lobe opacities concerning for possible pneumonia.  Additionally noting some gastric thickening for possible gastritis.  CT head was unremarkable Chest x-ray did not show any acute abnormalities..  I agree with the radiologist interpretation.  Cardiac monitoring: EKG obtained and interpreted by myself and attending physician which shows: Atrial flutter  EKG Interpretation Date/Time:  Tuesday September 27 2024 10:34:53 EST Ventricular Rate:  70 PR Interval:    QRS Duration:  146 QT Interval:  517 QTC Calculation: 558 R Axis:   25  Text Interpretation: Atrial flutter with varied AV block, Right bundle branch block Confirmed by Garrick Charleston [phone]) on 09/27/2024 11:06:38 AM  Medications: I ordered medication including Rocephin , Lasix , LR.  I have reviewed the patients home  medicines and have made adjustments as needed.  Critical Interventions: Antibiotics for sepsis  Social Determinants of Health: Lives at home with spouse  Disposition: After consideration of the diagnostic results and the patients response to treatment, I feel that the patient would benefit from admission, patient care be transferred to Dr. Dennise  Final diagnoses:  Sepsis due to pneumonia [facility])  Acute on chronic heart failure, unspecified heart failure type [facility])  Elevated troponin  Hypocalcemia    ED Discharge Orders     None          Beola Terrall RAMAN, [place] 09/27/24 1512  "

## 2024-09-28 DIAGNOSIS — I509 Heart failure, unspecified: Secondary | ICD-10-CM | POA: Diagnosis not present

## 2024-09-28 DIAGNOSIS — Z789 Other specified health status: Secondary | ICD-10-CM

## 2024-09-28 DIAGNOSIS — J9601 Acute respiratory failure with hypoxia: Secondary | ICD-10-CM | POA: Diagnosis not present

## 2024-09-28 DIAGNOSIS — Z66 Do not resuscitate: Secondary | ICD-10-CM | POA: Diagnosis not present

## 2024-09-28 DIAGNOSIS — A419 Sepsis, unspecified organism: Secondary | ICD-10-CM

## 2024-09-28 DIAGNOSIS — J690 Pneumonitis due to inhalation of food and vomit: Secondary | ICD-10-CM | POA: Diagnosis not present

## 2024-09-28 DIAGNOSIS — J189 Pneumonia, unspecified organism: Secondary | ICD-10-CM

## 2024-09-28 LAB — CBC WITH DIFFERENTIAL/PLATELET
Abs Immature Granulocytes: 0.11 K/uL — ABNORMAL HIGH (ref 0.00–0.07)
Basophils Absolute: 0.1 K/uL (ref 0.0–0.1)
Basophils Relative: 1 %
Eosinophils Absolute: 0.2 K/uL (ref 0.0–0.5)
Eosinophils Relative: 1 %
HCT: 30.3 % — ABNORMAL LOW (ref 39.0–52.0)
Hemoglobin: 9.6 g/dL — ABNORMAL LOW (ref 13.0–17.0)
Immature Granulocytes: 1 %
Lymphocytes Relative: 10 %
Lymphs Abs: 1.4 K/uL (ref 0.7–4.0)
MCH: 27 pg (ref 26.0–34.0)
MCHC: 31.7 g/dL (ref 30.0–36.0)
MCV: 85.4 fL (ref 80.0–100.0)
Monocytes Absolute: 1.7 K/uL — ABNORMAL HIGH (ref 0.1–1.0)
Monocytes Relative: 12 %
Neutro Abs: 11 K/uL — ABNORMAL HIGH (ref 1.7–7.7)
Neutrophils Relative %: 75 %
Platelets: 178 K/uL (ref 150–400)
RBC: 3.55 MIL/uL — ABNORMAL LOW (ref 4.22–5.81)
RDW: 17 % — ABNORMAL HIGH (ref 11.5–15.5)
WBC: 14.4 K/uL — ABNORMAL HIGH (ref 4.0–10.5)
nRBC: 0 % (ref 0.0–0.2)

## 2024-09-28 LAB — COMPREHENSIVE METABOLIC PANEL WITH GFR
ALT: 7 U/L (ref 0–44)
AST: 16 U/L (ref 15–41)
Albumin: 2.4 g/dL — ABNORMAL LOW (ref 3.5–5.0)
Alkaline Phosphatase: 73 U/L (ref 38–126)
Anion gap: 9 (ref 5–15)
BUN: 33 mg/dL — ABNORMAL HIGH (ref 8–23)
CO2: 19 mmol/L — ABNORMAL LOW (ref 22–32)
Calcium: 5.9 mg/dL — CL (ref 8.9–10.3)
Chloride: 118 mmol/L — ABNORMAL HIGH (ref 98–111)
Creatinine, Ser: 1.86 mg/dL — ABNORMAL HIGH (ref 0.61–1.24)
GFR, Estimated: 36 mL/min — ABNORMAL LOW
Glucose, Bld: 147 mg/dL — ABNORMAL HIGH (ref 70–99)
Potassium: 3 mmol/L — ABNORMAL LOW (ref 3.5–5.1)
Sodium: 146 mmol/L — ABNORMAL HIGH (ref 135–145)
Total Bilirubin: 0.6 mg/dL (ref 0.0–1.2)
Total Protein: 4.3 g/dL — ABNORMAL LOW (ref 6.5–8.1)

## 2024-09-28 LAB — RESPIRATORY PANEL BY PCR

## 2024-09-28 LAB — BLOOD GAS, ARTERIAL
Acid-base deficit: 1.3 mmol/L (ref 0.0–2.0)
Bicarbonate: 22.7 mmol/L (ref 20.0–28.0)
O2 Saturation: 100 %
Patient temperature: 36.9
pCO2 arterial: 35 mmHg (ref 32–48)
pH, Arterial: 7.42 (ref 7.35–7.45)
pO2, Arterial: 137 mmHg — ABNORMAL HIGH (ref 83–108)

## 2024-09-28 LAB — MAGNESIUM: Magnesium: 1.6 mg/dL — ABNORMAL LOW (ref 1.7–2.4)

## 2024-09-28 LAB — MRSA NEXT GEN BY PCR, NASAL: MRSA by PCR Next Gen: NOT DETECTED

## 2024-09-28 LAB — T4, FREE: Free T4: 1.27 ng/dL (ref 0.80–2.00)

## 2024-09-28 LAB — CBG MONITORING, ED
Glucose-Capillary: 186 mg/dL — ABNORMAL HIGH (ref 70–99)
Glucose-Capillary: 181 mg/dL — ABNORMAL HIGH (ref 70–99)

## 2024-09-28 LAB — GLUCOSE, CAPILLARY
Glucose-Capillary: 263 mg/dL — ABNORMAL HIGH (ref 70–99)
Glucose-Capillary: 329 mg/dL — ABNORMAL HIGH (ref 70–99)
Glucose-Capillary: 190 mg/dL — ABNORMAL HIGH (ref 70–99)

## 2024-09-28 LAB — C-REACTIVE PROTEIN: CRP: 9.7 mg/dL — ABNORMAL HIGH

## 2024-09-28 MED ORDER — SODIUM CHLORIDE 0.9 % IV SOLN
3.0000 g | Freq: Three times a day (TID) | INTRAVENOUS | Status: DC
  Administered 2024-09-28 (×2): 3 g via INTRAVENOUS
  Filled 2024-09-28 (×3): qty 8

## 2024-09-28 MED ORDER — CALCIUM GLUCONATE-NACL 1-0.675 GM/50ML-% IV SOLN
1.0000 g | Freq: Once | INTRAVENOUS | Status: AC
  Administered 2024-09-28: 1000 mg via INTRAVENOUS
  Filled 2024-09-28: qty 50

## 2024-09-28 MED ORDER — POTASSIUM CHLORIDE CRYS ER 20 MEQ PO TBCR
40.0000 meq | EXTENDED_RELEASE_TABLET | Freq: Two times a day (BID) | ORAL | Status: AC
  Administered 2024-09-28 – 2024-09-29 (×2): 40 meq via ORAL
  Filled 2024-09-28 (×2): qty 2

## 2024-09-28 MED ORDER — METHYLPREDNISOLONE SODIUM SUCC 40 MG IJ SOLR
40.0000 mg | Freq: Two times a day (BID) | INTRAMUSCULAR | Status: DC
  Administered 2024-09-28 – 2024-09-29 (×2): 40 mg via INTRAVENOUS
  Filled 2024-09-28 (×2): qty 1

## 2024-09-28 MED ORDER — POTASSIUM CHLORIDE 10 MEQ/100ML IV SOLN
10.0000 meq | INTRAVENOUS | Status: AC
  Administered 2024-09-28 (×2): 10 meq via INTRAVENOUS
  Filled 2024-09-28 (×2): qty 100

## 2024-09-28 MED ORDER — DEXTROSE 5 % IV SOLN: INTRAVENOUS | Status: DC

## 2024-09-28 MED ORDER — MAGNESIUM SULFATE 2 GM/50ML IV SOLN: 2.0000 g | Freq: Once | INTRAVENOUS | Status: DC

## 2024-09-28 NOTE — ED Notes (Signed)
 Pt in bed, pt is wet, cleaned pt and changed bedding, pt doesn't know day of the week, re oriented patient, pt awake and alert, pt ate all of meal, pt denies pain, pt reports sob with exertion and had increased RR rolling in bed.  Pt has no requests at this time

## 2024-09-28 NOTE — Progress Notes (Addendum)
 [facility] PROGRESS NOTE    Progress Note  [name]  FMW:[id number] DOB: 1945/11/12 DOA: 09/27/2024 PCP: Okey Carlin Redbird, MD     Brief Narrative:   Fernando Hess is an 79 y.o. male past medical history of paroxysmal atrial fibrillation on Eliquis , coronary artery disease, HFpEF with an EF of 65%, chronic kidney disease stage IV with a baseline creatinine of 2.3 sleep apnea who uses oxygen at night early dementia, dyslipidemia, essential hypertension insulin -dependent diabetes mellitus type 2, undiagnosed neuropathy with questionable undiagnosed myasthenia gravis who lives at home with his wife has been feeling weak for 2 to 3 days fell 2 days prior to admission, hit his head on the side of furniture did choke this morning while having breakfast subsequently vomited developed fever came to the hospital.   Assessment/Plan:   Acute respiratory failure with hypoxia secondary toAspiration pneumonia ([name]): Started empirically on IV fluids and IV Unasyn  CT of the chest and abdomen showed infiltrate in the right upper lobe and a small 1 in the left upper lobe.  Right pleural effusion.  He also has bilateral nonobstructive nephrolithiasis. Blood cultures were sent. Respiratory panel is negative. PRO-Calcitonin is low yield Speech has been consulted. Continue nebulizer treatment. Currently requiring 2 L of oxygen keep saturations greater 90%. Out of bed to chair, flutter valve, incentive spirometry. Consult PT OT. Has remained afebrile leukocytosis improved. Was contacted by the nurse, the patient was became tachypneic. Speech evaluated the patient recommended keep the patient is a okay, due to high risk of aspiration. Place on BiPAP. Consult PCCM, patient wants to be a full code with aggressive treatment  wife at bedside which wants aggressive treatment.  Paroxysmal atrial fibrillation: With a chads Vascor greater than 3. Continue Cardizem  and Eliquis .  History of CABG x  5/elevated troponins: Continue Eliquis  denies any chest pain or shortness of breath. Elevated troponins likely due to demand ischemia.  Essential hypertension: Continue Cardizem , Imdur  and hydralazine . Blood pressure seems to be well-controlled. Hydralazine  IV as needed.  Hypernatremia Vitamin D for recheck of his metabolic panel in the morning  HFpEF: Appears dry on physical exam started on gentle diuresis. KVO IV fluids.  Chronic kidney disease stage IV: With baseline creatinine around 2.  proBNP is elevated likely due to protecting as an acute phase reactant KVO IV fluids.  Insulin -dependent diabetes mellitus type 2: With a last A1c of 8.8 previously before that it was greater than 11. Started on long-acting insulin  plus sliding scale blood glucose been improved.  Undiagnosed peripheral neuropathy/undiagnosed myasthenia gravis: Currently no acute issues, PT OT has been consulted. Speech has also been consulted. Follow-up with neurology as an outpatient.  Early dementia: Continue Aricept  and Seroquel .  Obstructive sleep apnea: Oxygen at night.  BPH: Continue Flomax  and mirabegron .  Fall at home: PT OT have been consulted.,  TSH is unremarkable. B12 well over 2000  DVT prophylaxis: Eliquis  Family Communication: None Status is: Inpatient Remains inpatient appropriate because: Aspiration pneumonia    Code Status:     Code Status Orders  (From admission, onward)           Start     Ordered   09/27/24 1505  Full code  Continuous       Question:  By:  Answer:  Consent: discussion documented in EHR   09/27/24 1505           Code Status History     Date Active Date Inactive Code Status Order ID Comments User  Context   06/29/2024 0246 07/01/2024 1711 Full Code [id number]  Lee Kingfisher, MD ED   06/28/2024 2252 06/29/2024 0246 Full Code [id number]  Lee Kingfisher, MD ED   03/02/2024 0102 03/04/2024 1935 Full Code [id number]  Charlton Evalene RAMAN, MD ED    02/10/2024 0207 02/13/2024 1737 Full Code [id number]  Keturah Carrier, MD ED   09/11/2023 2125 09/17/2023 2100 Full Code [id number]  Lonzell Emeline HERO, DO ED   02/26/2023 2116 03/01/2023 2105 Full Code [id number]  Howerter, Eva NOVAK, DO ED   02/03/2023 0756 02/08/2023 1950 Full Code [id number]  Claudene Maximino LABOR, MD ED   01/08/2023 0015 01/12/2023 2002 Full Code [id number]  Silvester Ales, MD ED   12/24/2022 1857 01/01/2023 2006 Full Code [id number]  Tobie Jorie SAUNDERS, MD ED   09/17/2022 1509 09/24/2022 2046 Full Code [id number]  Seena Marsa NOVAK, MD ED   09/04/2022 1702 09/05/2022 2012 Full Code [id number]  Earley Saucer, MD ED   08/12/2022 1440 08/14/2022 2003 Full Code [id number]  Seena Marsa NOVAK, MD ED   06/06/2021 1051 06/06/2021 1623 Full Code [id number]  Bensimhon, Toribio SAUNDERS, MD Inpatient   05/10/2021 1552 05/12/2021 2354 Full Code [id number]  Laurita Cort DASEN, MD ED   11/18/2019 1908 11/20/2019 1839 Full Code [id number]  Jadine Aline BRAVO, [name] Inpatient   10/22/2015 2052 10/25/2015 1917 Full Code [id number]  Wonda Sharper, MD Inpatient   10/22/2015 1940 10/22/2015 2052 Full Code [id number]  Wonda Sharper, MD Inpatient   10/02/2015 1401 10/08/2015 1743 Full Code [id number]  Viviane Lemond BRAVO, [name] Inpatient   09/26/2015 1519 10/02/2015 1401 Full Code [id number]  Burnard Debby LABOR, MD Inpatient   09/24/2015 2031 09/26/2015 1519 Full Code [id number]  Blanca Elsie RAMAN, MD Inpatient      Advance Directive Documentation    Flowsheet Row Most Recent Value  Type of Advance Directive Healthcare Power of Attorney, Living will  Pre-existing out of facility DNR order (yellow form or pink MOST form) --  MOST Form in Place? --      IV Access:   Peripheral IV   Procedures and diagnostic studies:   CT CHEST ABDOMEN PELVIS WO CONTRAST Result Date: 09/27/2024 CLINICAL DATA:  Weakness, dizziness, fall. EXAM: CT CHEST, ABDOMEN AND PELVIS WITHOUT CONTRAST TECHNIQUE: Multidetector CT imaging of the chest, abdomen and pelvis was performed  following the standard protocol without IV contrast. RADIATION DOSE REDUCTION: This exam was performed according to the departmental dose-optimization program which includes automated exposure control, adjustment of the mA and/or kV according to patient size and/or use of iterative reconstruction technique. COMPARISON:  March 06, 2024. FINDINGS: CT CHEST FINDINGS Cardiovascular: Status post coronary artery bypass graft. Mild cardiomegaly. No pericardial effusion. No definite thoracic aortic aneurysm. Mediastinum/Nodes: No enlarged mediastinal, hilar, or axillary lymph nodes. Thyroid  gland, trachea, and esophagus demonstrate no significant findings. Lungs/Pleura: No pneumothorax is noted. Minimal right pleural effusion is noted. Airspace opacity is noted posteriorly in right upper lobe concerning for possible pneumonia. Smaller opacity is also noted in left upper lobe concerning for possible pneumonia. Musculoskeletal: No chest wall mass or suspicious bone lesions identified. CT ABDOMEN PELVIS FINDINGS Hepatobiliary: No focal liver abnormality is seen. No gallstones, gallbladder wall thickening, or biliary dilatation. Pancreas: Unremarkable. No pancreatic ductal dilatation or surrounding inflammatory changes. Spleen: Normal in size without focal abnormality. Adrenals/Urinary Tract: Adrenal glands appear normal. Small nonobstructive right renal calculus is noted. 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 Left nephrolithiasis is noted as well. No definite hydronephrosis or renal  obstruction is noted. Urinary bladder is unremarkable. Stomach/Bowel: Status post appendectomy. Wall thickening of proximal stomach is noted which may represent lack of distension, but gastritis cannot be excluded. There is no evidence of bowel obstruction. Mild to moderate amount of stool seen in the sigmoid colon and rectum. Vascular/Lymphatic: Aortic atherosclerosis. No enlarged abdominal or pelvic lymph nodes. Reproductive:  Prostate is unremarkable. Other: No abdominal wall hernia or abnormality. No abdominopelvic ascites. Musculoskeletal: Status post right total hip arthroplasty. No acute osseous abnormality is noted. IMPRESSION: 1. Airspace opacity is noted posteriorly in right upper lobe concerning for pneumonia. Smaller opacity is noted in left upper lobe as well. 2. Minimal right pleural effusion is noted. 3. Wall thickening of proximal stomach is noted which may represent lack of distension, but gastritis cannot be excluded. 4. Bilateral nonobstructive nephrolithiasis. 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 5. Aortic atherosclerosis. Aortic Atherosclerosis (ICD10-I70.0). Electronically Signed   By: Lynwood Landy Raddle M.D.   On: 09/27/2024 13:09   DG Chest 2 View Result Date: 09/27/2024 CLINICAL DATA:  Shortness of breath with recent fall. EXAM: CHEST - 2 VIEW COMPARISON:  06/30/2024 FINDINGS: Lungs are hypoinflated without focal airspace consolidation or effusion. Cardiomediastinal silhouette and remainder of the exam is unchanged. IMPRESSION: Hypoinflation without acute cardiopulmonary disease. Electronically Signed   By: Toribio Agreste M.D.   On: 09/27/2024 11:41   CT Head Wo Contrast Result Date: 09/27/2024 EXAM: CT HEAD WITHOUT CONTRAST 09/27/2024 10:39:00 AM TECHNIQUE: CT of the head was performed without the administration of intravenous contrast. Automated exposure control, iterative reconstruction, and/or weight based adjustment of the mA/kV was utilized to reduce the radiation dose to as low as reasonably achievable. COMPARISON: CT of the head dated 07/15/2023. CLINICAL HISTORY: The patient experienced minor head trauma and is aged 26 years or older. FINDINGS: BRAIN AND VENTRICLES: No acute hemorrhage. No evidence of acute infarct. Age-related atrophy. Mild periventricular white matter disease. Remote lacunar infarcts within right basal ganglia. No hydrocephalus. No extra-axial collection. No  mass effect or midline shift. ORBITS: Status post bilateral lens replacement. SINUSES: Polypoid mucosal thickening within right maxillary sinus. SOFT TISSUES AND SKULL: No acute soft tissue abnormality. No skull fracture. Moderate vascular calcifications. IMPRESSION: 1. No acute intracranial abnormality related to the minor head trauma. 2. Age-related atrophy, mild periventricular white matter disease, and remote lacunar infarcts within the right basal ganglia. Electronically signed by: Evalene Coho MD MD 09/27/2024 10:58 AM EST RP Workstation: [id number]     Medical Consultants:   None.   Subjective:    Fernando Hess he relates he continues to cough but feels better than on admission.  Objective:    Vitals:   09/28/24 0211 09/28/24 0400 09/28/24 0415 09/28/24 0500  BP: 121/72 136/78  133/72  Pulse: 70 72 74 70  Resp: 16 20 17  (!) 42  Temp: 98.2 F (36.8 C)     TempSrc: Oral     SpO2: 100% 99% 100% 97%  Height:       SpO2: 97 %  No intake or output data in the 24 hours ending 09/28/24 0602 There were no vitals filed for this visit.  Exam: General exam: In no acute distress. Respiratory system: Good air movement and crackles not clear with cough Cardiovascular system: S1 & S2 heard, RRR. No JVD. Gastrointestinal system: Abdomen is nondistended, soft and nontender.  Extremities: No pedal edema. Skin: No rashes, lesions or ulcers Psychiatry: Judgement and insight appear normal. Mood & affect  appropriate.    Data Reviewed:    Labs: Basic Metabolic Panel: Recent Labs  Lab 09/27/24 1035 09/27/24 1100 09/28/24 0511  NA 139 140 146*  K 4.2 4.2 3.0*  CL 107 107 118*  CO2 21*  --  19*  GLUCOSE 339* 336* 147*  BUN 34* 40* 33*  CREATININE 2.16* 2.20* 1.86*  CALCIUM  7.5*  --  5.9*  MG  --   --  1.6*   GFR CrCl cannot be calculated (Unknown ideal weight.). Liver Function Tests: Recent Labs  Lab 09/27/24 1035 09/28/24 0511  AST 17 16  ALT 8 7  ALKPHOS 105  73  BILITOT 1.1 0.6  PROT 5.7* 4.3*  ALBUMIN  3.1* 2.4*   Recent Labs  Lab 09/27/24 1035  LIPASE 11   No results for input(s): AMMONIA in the last 168 hours. Coagulation profile Recent Labs  Lab 09/27/24 1035  INR 2.2*   COVID-19 Labs  No results for input(s): DDIMER, FERRITIN, LDH, CRP in the last 72 hours.  Lab Results  Component Value Date   SARSCOV2NAA NEGATIVE 09/27/2024   SARSCOV2NAA NEGATIVE 06/28/2024   SARSCOV2NAA POSITIVE (A) 02/26/2023   SARSCOV2NAA NEGATIVE 01/07/2023    CBC: Recent Labs  Lab 09/27/24 1035 09/27/24 1100 09/28/24 0511  WBC 18.1*  --  14.4*  NEUTROABS 15.4*  --  11.0*  HGB 10.4* 10.5* 9.6*  HCT 32.8* 31.0* 30.3*  MCV 86.3  --  85.4  PLT 200  --  178   Cardiac Enzymes: No results for input(s): CKTOTAL, CKMB, CKMBINDEX, TROPONINI in the last 168 hours. BNP (last 3 results) Recent Labs    09/27/24 1035  PROBNP 5,797.0*   CBG: Recent Labs  Lab 09/27/24 1036 09/27/24 1723 09/27/24 2057 09/28/24 0501  GLUCAP 323* 280* 286* 186*   D-Dimer: No results for input(s): DDIMER in the last 72 hours. Hgb A1c: No results for input(s): HGBA1C in the last 72 hours. Lipid Profile: No results for input(s): CHOL, HDL, LDLCALC, TRIG, CHOLHDL, LDLDIRECT in the last 72 hours. Thyroid  function studies: Recent Labs    09/27/24 1208  TSH 0.396   Anemia work up: Recent Labs    09/27/24 1208  VITAMINB12 2,476*   Sepsis Labs: Recent Labs  Lab 09/27/24 1035 09/27/24 1048 09/27/24 1208 09/28/24 0511  PROCALCITON  --   --  0.41  --   WBC 18.1*  --   --  14.4*  LATICACIDVEN  --  1.6  --   --    Microbiology Recent Results (from the past 240 hours)  Resp panel by RT-PCR (RSV, Flu A&B, Covid) Anterior Nasal Swab     Status: None   Collection Time: 09/27/24 12:15 PM   Specimen: Anterior Nasal Swab  Result Value Ref Range Status   SARS Coronavirus 2 by RT PCR NEGATIVE NEGATIVE Final   Influenza A by  PCR NEGATIVE NEGATIVE Final   Influenza B by PCR NEGATIVE NEGATIVE Final    Comment: (NOTE) The Xpert Xpress SARS-CoV-2/FLU/RSV plus assay is intended as an aid in the diagnosis of influenza from Nasopharyngeal swab specimens and should not be used as a sole basis for treatment. Nasal washings and aspirates are unacceptable for Xpert Xpress SARS-CoV-2/FLU/RSV testing.  Fact Sheet for Patients: bloggercourse.com  Fact Sheet for Healthcare Providers: seriousbroker.it  This test is not yet approved or cleared by the [place]  FDA and has been authorized for detection and/or diagnosis of SARS-CoV-2 by FDA under an Emergency Use Authorization (EUA). This EUA will remain in  effect (meaning this test can be used) for the duration of the COVID-19 declaration under Section 564(b)(1) of the Act, 21 U.S.C. section 360bbb-3(b)(1), unless the authorization is terminated or revoked.     Resp Syncytial Virus by PCR NEGATIVE NEGATIVE Final    Comment: (NOTE) Fact Sheet for Patients: bloggercourse.com  Fact Sheet for Healthcare Providers: seriousbroker.it  This test is not yet approved or cleared by the [place]  FDA and has been authorized for detection and/or diagnosis of SARS-CoV-2 by FDA under an Emergency Use Authorization (EUA). This EUA will remain in effect (meaning this test can be used) for the duration of the COVID-19 declaration under Section 564(b)(1) of the Act, 21 U.S.C. section 360bbb-3(b)(1), unless the authorization is terminated or revoked.  Performed at [facility], [address] [place], [name] [address]   Respiratory (~20 pathogens) panel by PCR     Status: None   Collection Time: 09/27/24  3:20 PM   Specimen: Nasopharyngeal Swab; Respiratory  Result Value Ref Range Status   Adenovirus NOT DETECTED NOT DETECTED Final   Coronavirus 229E NOT DETECTED  NOT DETECTED Final    Comment: (NOTE) The Coronavirus on the Respiratory Panel, DOES NOT test for the novel  Coronavirus (2019 nCoV)    Coronavirus HKU1 NOT DETECTED NOT DETECTED Final   Coronavirus NL63 NOT DETECTED NOT DETECTED Final   Coronavirus OC43 NOT DETECTED NOT DETECTED Final   Metapneumovirus NOT DETECTED NOT DETECTED Final   Rhinovirus / Enterovirus NOT DETECTED NOT DETECTED Final   Influenza A NOT DETECTED NOT DETECTED Final   Influenza B NOT DETECTED NOT DETECTED Final   Parainfluenza Virus 1 NOT DETECTED NOT DETECTED Final   Parainfluenza Virus 2 NOT DETECTED NOT DETECTED Final   Parainfluenza Virus 3 NOT DETECTED NOT DETECTED Final   Parainfluenza Virus 4 NOT DETECTED NOT DETECTED Final   Respiratory Syncytial Virus NOT DETECTED NOT DETECTED Final   Bordetella pertussis NOT DETECTED NOT DETECTED Final   Bordetella Parapertussis NOT DETECTED NOT DETECTED Final   Chlamydophila pneumoniae NOT DETECTED NOT DETECTED Final   Mycoplasma pneumoniae NOT DETECTED NOT DETECTED Final    Comment: Performed at [facility], [address] [place], [place] [address]     Medications:    apixaban   2.5 mg Oral BID   [START ON 10/01/2024] cyanocobalamin   1,000 mcg Intramuscular Weekly   diltiazem   90 mg Oral Q8H   insulin  aspart  0-15 Units Subcutaneous TID WC   insulin  aspart  0-5 Units Subcutaneous QHS   insulin  aspart  3 Units Subcutaneous TID WC   insulin  glargine  15 Units Subcutaneous BID   ipratropium  0.5 mg Nebulization Q6H   isosorbide  mononitrate  30 mg Oral Daily   mirabegron  ER  50 mg Oral QHS   pantoprazole   40 mg Oral Daily   QUEtiapine   25 mg Oral QHS   tamsulosin   0.4 mg Oral Daily   Continuous Infusions:  ampicillin -sulbactam (UNASYN ) IV Stopped (09/28/24 0531)   calcium  gluconate 1,000 mg (09/28/24 0545)   lactated ringers  75 mL/hr at 09/27/24 2113   potassium chloride         LOS: 1 day   Erle Odell Castor  [facility]  09/28/2024,  6:02 AM

## 2024-09-28 NOTE — Progress Notes (Signed)
 NIF= past -40 with great pt effort.

## 2024-09-28 NOTE — Progress Notes (Signed)
 NIF = -40; FVC = 1.08L.  Great effort.  Will continue to monitor.

## 2024-09-28 NOTE — Evaluation (Signed)
 Clinical/Bedside Swallow Evaluation Patient Details  Name: Fernando Hess MRN: [id number] Date of Birth: [birth year]  Today's Date: 09/28/2024 Time: SLP Start Time (ACUTE ONLY): 1423 SLP Stop Time (ACUTE ONLY): 1444 SLP Time Calculation (min) (ACUTE ONLY): 21 min  Past Medical History:  Past Medical History:  Diagnosis Date   Acute renal failure (ARF) 09/24/2015   Acute stress disorder 11/13/2021   Atrial premature complexes    CAD (coronary artery disease) of artery bypass graft    Early occlusion of saphenous vein graft to intermediate and marginal branch in February 2007 following bypass grafting    CAD (coronary artery disease), native coronary artery 2017   hx NSTEMI 09-24-2015  s/p  CABG x5 on 10-02-2015;  post op STEMI inferolateral wall,  SVG OM1 and SVG OM2 occluded, distal OM occlusion the calpruit, treated medically // Myoview  7/21: no ischemia, EF 65, low risk   CHF (congestive heart failure) ([name])    CKD (chronic kidney disease), stage III ([name])    Contusion of right knee 03/16/2020   Dyspnea    Elevated troponin    Erectile dysfunction    Esophageal reflux    History of atrial fibrillation    post op CABG 10-02-2015   History of kidney stones    History of non-ST elevation myocardial infarction (NSTEMI) 09/24/2015   s/p  CABG x5   History of ST elevation myocardial infarction (STEMI) 10/22/2015   inferior wall,  post op CABG 10-02-2015   Hyperlipidemia    Hypertension    Left ureteral stone    Mild atherosclerosis of both carotid arteries    Nephrolithiasis    per CT bilateral non-obstructive calculi   Nocturnal hypoxemia    OSA (obstructive sleep apnea)    Peripheral artery disease    LE Arterial US  01/2019: R PTA and ATA occluded; L ATA occluded   RBBB (right bundle branch block)    Renal atrophy, right    Sleep apnea    wears cpap    ST elevation myocardial infarction (STEMI) of inferior wall ([name]) 10/22/2015   Type 2 diabetes mellitus treated with insulin   ([name])    followed by pcp   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) 03/01/2008   Wears glasses    Past Surgical History:  Past Surgical History:  Procedure Laterality Date   APPENDECTOMY  1965   CARDIAC CATHETERIZATION N/A 09/26/2015   Procedure: Left Heart Cath and Coronary Angiography;  Surgeon: Debby DELENA Sor, MD;  Location: [facility] INVASIVE CV LAB;  Service: Cardiovascular;  Laterality: N/A;   CARDIAC CATHETERIZATION N/A 10/22/2015   Procedure: Left Heart Cath and Coronary Angiography;  Surgeon: Ozell Fell, MD;  Location: [facility] INVASIVE CV LAB;  Service: Cardiovascular;  Laterality: N/A;   CATARACT EXTRACTION W/ INTRAOCULAR LENS  IMPLANT, BILATERAL  2017   COLONOSCOPY     CORONARY ARTERY BYPASS GRAFT N/A 10/02/2015   Procedure: CORONARY ARTERY BYPASS GRAFTING (CABG) X5 LIMA-LAD; SVG-DIAG; SVG-OM; SVG-PD; SVG-RAMUS TRANSESOPHAGEAL ECHOCARDIOGRAM (TEE) ENDOSCOPIC GREATER SAPHENOUS VEIN  HARVEST BILAT LE;  Surgeon: Maude Fleeta Ochoa, MD;  Location: [facility] OR;  Service: Open Heart Surgery;  Laterality: N/A;   CYSTOSCOPY/URETEROSCOPY/HOLMIUM LASER/STENT PLACEMENT Left 08/10/2018   Procedure: CYSTOSCOPY/URETEROSCOPY/HOLMIUM LASER/STENT PLACEMENT;  Surgeon: Nieves Cough, MD;  Location: [facility];  Service: Urology;  Laterality: Left;   CYSTOSCOPY/URETEROSCOPY/HOLMIUM LASER/STENT PLACEMENT Left 09/10/2018   Procedure: CYSTOSCOPY/URETEROSCOPY/HOLMIUM LASER/STENT EXCHANGE;  Surgeon: Nieves Cough, MD;  Location: [facility] ORS;  Service:  Urology;  Laterality: Left;   HIP ARTHROPLASTY Right 12/26/2022   Procedure: RIGHT HEMI HIP ARTHROPLASTY;  Surgeon: Edna Toribio LABOR, MD;  Location: [facility] OR;  Service: Orthopedics;  Laterality: Right;   LEFT HEART CATHETERIZATION WITH CORONARY ANGIOGRAM N/A 04/13/2014   Procedure: LEFT HEART CATHETERIZATION WITH CORONARY ANGIOGRAM;  Surgeon: Elsie GORMAN Somerset, MD;  Location: [facility] CATH LAB;   Service: Cardiovascular;  Laterality: N/A;   LEG SURGERY Right age 61   closed reduction leg fracture   NASAL SEPTOPLASTY W/ TURBINOPLASTY Bilateral 08/30/2021   Procedure: NASAL SEPTOPLASTY WITH BILATERAL INFERIOR TURBINATE REDUCTION;  Surgeon: Mable Lenis, MD;  Location: [facility] OR;  Service: ENT;  Laterality: Bilateral;   POLYPECTOMY     RIGHT HEART CATH N/A 06/06/2021   Procedure: RIGHT HEART CATH;  Surgeon: Cherrie Toribio SAUNDERS, MD;  Location: [facility] INVASIVE CV LAB;  Service: Cardiovascular;  Laterality: N/A;   TEE WITHOUT CARDIOVERSION N/A 10/02/2015   Procedure: TRANSESOPHAGEAL ECHOCARDIOGRAM (TEE);  Surgeon: Maude Fleeta Ochoa, MD;  Location: [facility];  Service: Open Heart Surgery;  Laterality: N/A;   URETEROSCOPY WITH HOLMIUM LASER LITHOTRIPSY Bilateral 2004;  2005  dr grapey  @[facility]    VASECTOMY     HPI:  Fernando Hess is an 79 y.o. male who presented from home to the hospital on 09/28/23 for falls and weakness. He was admitted with acute respiratory failure with hypoxia secondary to aspiration PNA. CT chest showed Airspace opacity is noted posteriorly in right upper lobe concerning for pneumonia. Smaller opacity is noted in left upper lobe as well. PMH: dementia, CVA, myasthenia gravis, DM, CAD s/p CABG, chronic HfpEF, CKD, depression, anxiety, OSA on CPAP, a-fib.    Assessment / Plan / Recommendation  Clinical Impression  Plan: NPO for now except meds, water  sips PRN, MBS next date schedule permitting.  Patient presents with clinical s/s of dysphagia as per this bedside swallow evaluation. RR remained high in low to mid 30's. He exhibited suspected discoordination of swallow with straw sips of thin liquids, followed by immediate and delayed throat clearing which persisted as well as wet vocal quality. SLP messaged MD and RN in secure Epic message chat an all in agreement for NPO except meds, water  sips PRN. SLP will plan for MBS next date. SLP Visit Diagnosis: Dysphagia, unspecified (R13.10)     Aspiration Risk  Mild aspiration risk;Moderate aspiration risk    Diet Recommendation NPO except meds;Free water  protocol after oral care;NPO    Medication Administration: Other (Comment) (as tolerated) Compensations: Small sips/bites;Slow rate    Other Recommendations Oral Care Recommendations: Oral care BID     Swallow Evaluation Recommendations     Assistance Recommended at Discharge    Functional Status Assessment Patient has had a recent decline in their functional status and demonstrates the ability to make significant improvements in function in a reasonable and predictable amount of time.  Frequency and Duration min 2x/week  1 week       Prognosis Prognosis for improved oropharyngeal function: Good Barriers to Reach Goals: Cognitive deficits      Swallow Study   General Date of Onset: 09/27/24 HPI: Fernando Hess is an 79 y.o. male who presented from home to the hospital on 09/28/23 for falls and weakness. He was admitted with acute respiratory failure with hypoxia secondary to aspiration PNA. CT chest showed Airspace opacity is noted posteriorly in right upper lobe concerning for pneumonia. Smaller opacity is noted in left upper lobe as well. PMH: dementia, CVA, myasthenia gravis, DM, CAD s/p  CABG, chronic HfpEF, CKD, depression, anxiety, OSA on CPAP, a-fib. Type of Study: Bedside Swallow Evaluation Previous Swallow Assessment: BSE's during previous admissions in 2024 Diet Prior to this Study: Regular;Thin liquids (Level 0) Temperature Spikes Noted: No Respiratory Status: Nasal cannula History of Recent Intubation: No Behavior/Cognition: Cooperative;Pleasant mood;Alert Oral Cavity Assessment: Within Functional Limits Oral Care Completed by SLP: No Oral Cavity - Dentition: Adequate natural dentition Vision: Functional for self-feeding Self-Feeding Abilities: Able to feed self Patient Positioning: Upright in bed Baseline Vocal Quality: Normal Volitional Cough:  Cognitively unable to elicit Volitional Swallow: Unable to elicit    Oral/Motor/Sensory Function Overall Oral Motor/Sensory Function: Within functional limits   Ice Chips     Thin Liquid Thin Liquid: Impaired Presentation: Straw Pharyngeal  Phase Impairments: Throat Clearing - Delayed;Throat Clearing - Immediate;Wet Vocal Quality    Nectar Thick Nectar Thick Liquid: Not tested   Honey Thick Honey Thick Liquid: Not tested   Puree Puree: Not tested   Solid     Solid: Not tested      Fernando IVAR Blase, MA, CCC-SLP Speech Therapy  09/28/2024,3:27 PM

## 2024-09-28 NOTE — IPAL (Signed)
" °  Interdisciplinary Goals of Care Family Meeting   Date carried out: 09/28/2024  Location of the meeting: Bedside  Member's involved: Family Member or next of kin, Audiological Scientist or acting medical decision maker: Spouse    Discussion: We discussed goals of care for [employer]. I had a discussion with he and his spouse at the bedside. We discussed his current condition and more importantly, his recent hx of multiple falls, what sounds like progressive dementia with dependence of all ADL's, generalized weakness, failure to thrive. We also discussed patient's prior wishes under circumstances such as this. I expressed my concern that if Mr. Lofton were to ever have a prolonged hospitalization or transfer to the ICU with intubation/mechanical ventilation and other advanced therapies, there was a high risk for things such as critical illness myopathy and weakness, ICU delirium and overall failure to thrive, recurrent aspiration etc. Mr. Cheatwood has made it clear that he would not want to be placed on life support measures if they wouldn't provide meaningful benefit. Furthermore, his spouse states that he has never wanted to be placed in a facility and would always prefer to be at home. After weighing the options, his spouse has decided not to perform resuscitation if arrest were to occur, but to otherwise continue with current medical support / therapies. I relayed this to Mr. Baca while he was on BiPAP and he nodded his head yes when asked if he understood what this meant and if he was in agreement.   Code status:   Code Status: Limited: Do not attempt resuscitation (DNR) -DNR-LIMITED -Do Not Intubate/DNI    Disposition: Continue current acute care  Time spent for the meeting: 15 minutes.    Saba Neuman, [name]  09/28/2024, 4:50 PM   "

## 2024-09-28 NOTE — Progress Notes (Signed)
 PHARMACY NOTE:  ANTIMICROBIAL RENAL DOSAGE ADJUSTMENT  Current antimicrobial regimen includes a mismatch between antimicrobial dosage and estimated renal function.  As per policy approved by the Pharmacy & Therapeutics and Medical Executive Committees, the antimicrobial dosage will be adjusted accordingly.  Current antimicrobial dosage:  Unasyn  3gm IV 12h  Indication: asp pna  Renal Function:  Estimated Creatinine Clearance: 31.9 mL/min (A) (by C-G formula based on SCr of 1.86 mg/dL (H)).    Antimicrobial dosage has been changed to:  Unasyn  3gm IV q8h   Thank you for allowing pharmacy to be a part of this patient's care.  Vito Ralph, PharmD, BCPS Please see amion for complete clinical pharmacist phone list 09/28/2024 12:07 PM

## 2024-09-28 NOTE — Evaluation (Signed)
 Physical Therapy Evaluation Patient Details Name: Fernando Hess MRN: [id number] DOB: Apr 23, 1946 Today's Date: 09/28/2024  History of Present Illness  Pt is 79 year old presented to [facility] on  09/28/23 for falls and weakness. Pt with acute respiratory failure with hypoxia secondary to aspiration PNA. SABRA PMH - CVA, myasthenia gravis, DM, CAD status post CABG, chronic HFpEF, CKD 3B, depression, anxiety, dementia, and OSA on CPAP, afib  Clinical Impression  Pt admitted with above diagnosis and presents to PT with functional limitations due to deficits listed below (See PT problem list). Pt needs skilled PT to maximize independence and safety. Pt from home with wife. Uses rollator at baseline and has had recent falls. Able to amb in hallway with assist. Removed O2 prior to session. At rest SpO2 was 93-96%. Decr to 88% with amb so replaced O2 at 2L. Pt wants to return home with wife and is agreeable to HHPT. Will continue to follow and update recommendations as needed.           If plan is discharge home, recommend the following: Help with stairs or ramp for entrance;Assist for transportation;Assistance with cooking/housework;A little help with bathing/dressing/bathroom;A little help with walking and/or transfers   Can travel by private vehicle        Equipment Recommendations None recommended by PT  Recommendations for Other Services       Functional Status Assessment Patient has had a recent decline in their functional status and demonstrates the ability to make significant improvements in function in a reasonable and predictable amount of time.     Precautions / Restrictions Precautions Precautions: Fall;Other (comment) Recall of Precautions/Restrictions: Impaired Precaution/Restrictions Comments: Watch SpO2 Restrictions Weight Bearing Restrictions Per Provider Order: No      Mobility  Bed Mobility Overal bed mobility: Needs Assistance Bed Mobility: Supine to Sit, Sit to Supine      Supine to sit: Min assist, HOB elevated, Used rails Sit to supine: Min assist   General bed mobility comments: Assist to elevate trunk into sitting. Assist to bring legs back up onto stretcher.    Transfers Overall transfer level: Needs assistance Equipment used: Rollator (4 wheels) Transfers: Sit to/from Stand Sit to Stand: Contact guard assist           General transfer comment: Assist for safety    Ambulation/Gait Ambulation/Gait assistance: Contact guard assist Gait Distance (Feet): 150 Feet Assistive device: Rollator (4 wheels) Gait Pattern/deviations: Step-through pattern, Decreased step length - right, Decreased step length - left, Decreased stride length Gait velocity: decr Gait velocity interpretation: <1.8 ft/sec, indicate of risk for recurrent falls   General Gait Details: Assist for safety. No loss of balance using rollator.  Stairs            Wheelchair Mobility     Tilt Bed    Modified Rankin (Stroke Patients Only)       Balance Overall balance assessment: Needs assistance Sitting-balance support: Bilateral upper extremity supported, Feet unsupported Sitting balance-Leahy Scale: Poor Sitting balance - Comments: UE support. Pt on high stretcher   Standing balance support: Bilateral upper extremity supported, During functional activity Standing balance-Leahy Scale: Poor Standing balance comment: UE support on rollator                             Pertinent Vitals/Pain Pain Assessment Pain Assessment: No/denies pain    Home Living Family/patient expects to be discharged to:: Private residence Living Arrangements: Spouse/significant other Available  Help at Discharge: Family Type of Home: House Home Access: Stairs to enter Entrance Stairs-Rails: Right Entrance Stairs-Number of Steps: 7   Home Layout: Two level;Able to live on main level with bedroom/bathroom Home Equipment: Rolling Walker (2 wheels);Shower seat;Grab bars -  tub/shower;BSC/3in1;Wheelchair - Public Librarian (4 wheels)      Prior Function Prior Level of Function : Needs assist             Mobility Comments: Modified independent with rollator. Several recent falls. ADLs Comments: Wife manages IADL's. Assist for showering     Extremity/Trunk Assessment   Upper Extremity Assessment Upper Extremity Assessment: Defer to OT evaluation    Lower Extremity Assessment Lower Extremity Assessment: Generalized weakness    Cervical / Trunk Assessment Cervical / Trunk Assessment: Kyphotic  Communication   Communication Communication: No apparent difficulties    Cognition Arousal: Alert Behavior During Therapy: WFL for tasks assessed/performed   PT - Cognitive impairments: History of cognitive impairments, Problem solving, Memory                       PT - Cognition Comments: Slow processing Following commands: Impaired Following commands impaired: Follows multi-step commands with increased time     Cueing Cueing Techniques: Verbal cues     General Comments      Exercises     Assessment/Plan    PT Assessment Patient needs continued PT services  PT Problem List Decreased strength;Decreased mobility;Decreased balance;Decreased activity tolerance       PT Treatment Interventions DME instruction;Stair training;Therapeutic activities;Balance training;Gait training;Functional mobility training;Therapeutic exercise;Patient/family education    PT Goals (Current goals can be found in the Care Plan section)  Acute Rehab PT Goals Patient Stated Goal: return home PT Goal Formulation: With patient Time For Goal Achievement: 10/12/24 Potential to Achieve Goals: Good    Frequency Min 2X/week     Co-evaluation               AM-PAC PT 6 Clicks Mobility  Outcome Measure Help needed turning from your back to your side while in a flat bed without using bedrails?: A Little Help needed moving from lying on  your back to sitting on the side of a flat bed without using bedrails?: A Little Help needed moving to and from a bed to a chair (including a wheelchair)?: A Little Help needed standing up from a chair using your arms (e.g., wheelchair or bedside chair)?: A Little Help needed to walk in hospital room?: A Little Help needed climbing 3-5 steps with a railing? : A Little 6 Click Score: 18    End of Session Equipment Utilized During Treatment: Gait belt Activity Tolerance: Patient tolerated treatment well Patient left: in bed;with call bell/phone within reach (on stretcher in ED) Nurse Communication: Mobility status PT Visit Diagnosis: Unsteadiness on feet (R26.81);Other abnormalities of gait and mobility (R26.89);Repeated falls (R29.6);Muscle weakness (generalized) (M62.81)    Time: 1006-1030 PT Time Calculation (min) (ACUTE ONLY): 24 min   Charges:   PT Evaluation $PT Eval Moderate Complexity: 1 Mod PT Treatments $Gait Training: 8-22 mins PT General Charges $$ ACUTE PT VISIT: 1 Visit         [facility] PT Acute Rehabilitation Services Office [phone]   [facility] 09/28/2024, 12:39 PM

## 2024-09-28 NOTE — Progress Notes (Signed)
 NIF obtained per MD pt achieved <-40 a this time.

## 2024-09-28 NOTE — Consult Note (Signed)
 "  NAME:  Fernando Hess, MRN:  [id number], DOB:  [birth year], LOS: 1 ADMISSION DATE:  09/27/2024, CONSULTATION DATE:  09/28/24 REFERRING MD:  Jone CHIEF COMPLAINT:  Dyspnea   History of Present Illness:  Fernando Hess is a 79 y.o. male who has a PMH as below including but not limited to PAF on Eliquis , dCHF, CKD 4, OSA on 2L O2 nocturnally, moderate dementia, seronegative myasthenia gravis (followed by neurologist in [place]).   He presented to [facility] ED on 1/13 after several falls at home.  Per his wife he had been falling quite frequently about 1 to 2 years ago but it improved with use of a rolling walker.  Over the past 2 to 3 days prior to admission, he had increased in falls again where he struck his head on a piece of furniture 3 days prior without any loss of consciousness.  He had also apparently choked on a piece of food and then proceeded to have a vomiting episode.  Per his wife, he takes too big of bites of food and then has trouble chewing and swallowing this.  CT chest/abdomen/pelvis demonstrated RUL pneumonia and small opacity in the LUL also, with small right pleural effusion, bilateral nonobstructive nephrolithiasis.  CT head was negative for acute process.  He was admitted by [facility] for aspiration pneumonia and was started on Unasyn .  On 1/14, he was noted to have increased work of breathing and episode of respiratory distress.  He had been seen by SLP prior to this with recommendations for n.p.o. due to moderate aspiration risk.  He was on 4 L nasal cannula O2 but was escalated to BiPAP therapy due to increased work of breathing.  Due to this, PCCM was subsequently consulted for evaluation. NIF was obtained just prior to my arrival and was -40. ABG also obtained and normal at 7.42/35/137.  Per his wife as well as RT, he has stabilized significantly on BiPAP.  He is currently breathing comfortably with normal effort.  I had a long discussion regarding goals of care with patient and  his wife at bedside.  They have elected to place DNR orders on the chart in the case of any respiratory or cardiac arrest.  They would like all supportive therapies otherwise.  See separate IPAL note.   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 ([name]); Hyperlipidemia; Essential hypertension; History of kidney stones; Coronary artery disease involving native coronary artery of native heart without angina pectoris; S/P CABG x 5; Intractable vascular headache; CKD (chronic kidney disease), stage IV ([name]); Peripheral artery disease; Obstructive sleep apnea; (HFpEF) heart failure with preserved ejection fraction ([name]); 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); Choroidal nevus of right eye; Syncope; Nasal septal deviation; Moderate aortic stenosis; Paroxysmal atrial fibrillation ([name]); Constipation; Insulin  dependent type 2 diabetes mellitus ([name]); Severe major depression, single episode, without psychotic features ([name]); Amaurosis fugax; Amnesia; Anticoagulated by anticoagulation treatment; Anxiety; Cardiac arrhythmia; Gastro-esophageal reflux disease without esophagitis; Globus pharyngeus; Hearing loss; Hypercoagulable state; Hyperparathyroidism due to renal insufficiency; Posterior vitreous detachment of both eyes; CKD stage 3b, GFR 30-44 ml/min ([name]); History of seronegative myasthenia gravis ([name]); Generalized weakness; AKI (acute kidney injury); Acute encephalopathy; Acute metabolic encephalopathy; Fracture of femoral neck, right, closed ([name]); Depression with anxiety; Left ankle pain; Hematuria; Acute kidney injury superimposed on chronic kidney disease; Transient hypotension; SIRS (systemic inflammatory response syndrome) ([name]); Dementia without behavioral disturbance (  [name]); Fall at home, initial encounter; Acute respiratory failure with hypoxia ([name]);  Hypokalemia; Prolonged QT interval; COVID-19 virus infection; BPH (benign prostatic hyperplasia); History of anemia due to chronic kidney disease; COVID-19; SOB (shortness of breath); Acute on chronic diastolic CHF (congestive heart failure) ([name]); Obesity, class 1; Shortness of breath; Nocturnal hypoxia; History of anxiety; History of CAD (coronary artery disease); History of nocturnal hypoxia ([name]); Acute CVA (cerebrovascular accident) [facility]); and Aspiration pneumonia ([name]) on their problem list.  Significant Hospital Events: Including procedures, antibiotic start and stop dates in addition to other pertinent events   1/13 admit 1/14 PCCM consult  Interim History / Subjective:  On BiPAP. Tolerating well, normal work of breathing.  ABG normal at 7.42/35/137.  Objective:  Blood pressure 103/84, pulse 78, temperature 98.6 F (37 C), temperature source Axillary, resp. rate (!) 31, height 5' 6 (1.676 m), weight 82 kg, SpO2 98%.    Vent Mode: PCV FiO2 (%):  [40 %] 40 % Set Rate:  [10 bmp] 10 bmp PEEP:  [5 cmH20] 5 cmH20   Intake/Output Summary (Last 24 hours) at 09/28/2024 1625 Last data filed at 09/28/2024 0830 Gross per 24 hour  Intake 1033.55 ml  Output --  Net 1033.55 ml   Filed Weights   09/28/24 1104  Weight: 82 kg     Physical Exam: General: Elderly male, chronically ill appearing, in NAD. Neuro: Awake, nods heads, mouths words, moves extremities though globally weak. HEENT: [place]/AT. Sclerae anicteric. EOMI. No ptosis. BiPAP in place. Cardiovascular: IRIR, no M/R/G.  Lungs: Respirations even and unlabored.  CTA bilaterally, No W/R/R. Abdomen: BS x 4, soft, NT/ND.  Musculoskeletal: No gross deformities, no edema.    Assessment & Plan:   Acute hypoxic respiratory failure secondary to aspiration pneumonia. OSA on 2 L O2 nocturnally.   - Continue BiPAP therapy for now. - Transition back to nasal cannula in another 1 to 2 hours as he is doing quite well at this time. No need  for ICU transfer. - Continue empiric Unasyn . - Follow cultures. - Bronchial hygiene. - Continue bronchodilators. - Keep on dry side as able. - NPO, SLP following for progression. - Mobilize as able, PT and OT consulted.  Goals of care - Long discussion with patient and spouse at bedside and ultimately decided to place DNR/DNI orders and the chart. - See separate IPAL note.  Hx seronegative myasthenia gravis (followed by neurology in [place]), moderate dementia. - PT/OT. - NIF/VC per shift. - Consider neuro consult if worsens, currently ok. - DNR/DNI established today 09/28/24.   Rest per primary team. Nothing further to add.  PCCM will sign off.  Please call us  back if we can be of any further assistance.   Labs   CBC: Recent Labs  Lab 09/27/24 1035 09/27/24 1100 09/28/24 0511  WBC 18.1*  --  14.4*  NEUTROABS 15.4*  --  11.0*  HGB 10.4* 10.5* 9.6*  HCT 32.8* 31.0* 30.3*  MCV 86.3  --  85.4  PLT 200  --  178    Basic Metabolic Panel: Recent Labs  Lab 09/27/24 1035 09/27/24 1100 09/28/24 0511  NA 139 140 146*  K 4.2 4.2 3.0*  CL 107 107 118*  CO2 21*  --  19*  GLUCOSE 339* 336* 147*  BUN 34* 40* 33*  CREATININE 2.16* 2.20* 1.86*  CALCIUM  7.5*  --  5.9*  MG  --   --  1.6*   GFR: Estimated Creatinine Clearance: 31.9 mL/min (A) (by C-G formula based  on SCr of 1.86 mg/dL (H)). Recent Labs  Lab 09/27/24 1035 09/27/24 1048 09/27/24 1208 09/28/24 0511  PROCALCITON  --   --  0.41  --   WBC 18.1*  --   --  14.4*  LATICACIDVEN  --  1.6  --   --     Liver Function Tests: Recent Labs  Lab 09/27/24 1035 09/28/24 0511  AST 17 16  ALT 8 7  ALKPHOS 105 73  BILITOT 1.1 0.6  PROT 5.7* 4.3*  ALBUMIN  3.1* 2.4*   Recent Labs  Lab 09/27/24 1035  LIPASE 11   No results for input(s): AMMONIA in the last 168 hours.  ABG    Component Value Date/Time   PHART 7.42 09/28/2024 1601   PCO2ART 35 09/28/2024 1601   PO2ART 137 (H) 09/28/2024 1601   HCO3  22.7 09/28/2024 1601   TCO2 22 09/27/2024 1100   ACIDBASEDEF 1.3 09/28/2024 1601   O2SAT 100 09/28/2024 1601     Coagulation Profile: Recent Labs  Lab 09/27/24 1035  INR 2.2*    Cardiac Enzymes: No results for input(s): CKTOTAL, CKMB, CKMBINDEX, TROPONINI in the last 168 hours.  HbA1C: Hgb A1c MFr Bld  Date/Time Value Ref Range Status  06/29/2024 08:36 AM 8.8 (H) 4.8 - 5.6 % Final    Comment:    (NOTE) Diagnosis of Diabetes The following HbA1c ranges recommended by the American Diabetes Association (ADA) may be used as an aid in the diagnosis of diabetes mellitus.  Hemoglobin             Suggested A1C NGSP%              Diagnosis  <5.7                   Non Diabetic  5.7-6.4                Pre-Diabetic  >6.4                   Diabetic  <7.0                   Glycemic control for                       adults with diabetes.    02/10/2024 04:54 AM 10.8 (H) 4.8 - 5.6 % Final    Comment:    (NOTE) Diagnosis of Diabetes The following HbA1c ranges recommended by the American Diabetes Association (ADA) may be used as an aid in the diagnosis of diabetes mellitus.  Hemoglobin             Suggested A1C NGSP%              Diagnosis  <5.7                   Non Diabetic  5.7-6.4                Pre-Diabetic  >6.4                   Diabetic  <7.0                   Glycemic control for                       adults with diabetes.      CBG: Recent Labs  Lab 09/27/24 2057 09/28/24 0501 09/28/24 0824 09/28/24 1249  09/28/24 1616  GLUCAP 286* 186* 181* 263* 329*    Review of Systems:   Unable to obtain as pt is on BiPAP currently.  Past Medical History:  He,  has a past medical history of Acute renal failure (ARF) (09/24/2015), Acute stress disorder (11/13/2021), Atrial premature complexes, CAD (coronary artery disease) of artery bypass graft, CAD (coronary artery disease), native coronary artery (2017), CHF (congestive heart failure) ([name]), CKD (chronic  kidney disease), stage III ([name]), Contusion of right knee (03/16/2020), Dyspnea, Elevated troponin, Erectile dysfunction, Esophageal reflux, History of atrial fibrillation, History of kidney stones, History of non-ST elevation myocardial infarction (NSTEMI) (09/24/2015), History of ST elevation myocardial infarction (STEMI) (10/22/2015), Hyperlipidemia, Hypertension, Left ureteral stone, Mild atherosclerosis of both carotid arteries, Nephrolithiasis, Nocturnal hypoxemia, OSA (obstructive sleep apnea), Peripheral artery disease, RBBB (right bundle branch block), Renal atrophy, right, Sleep apnea, ST elevation myocardial infarction (STEMI) of inferior wall ([name]) (10/22/2015), Type 2 diabetes mellitus treated with insulin  ([name]), 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   Surgical History:   Past Surgical History:  Procedure Laterality Date   APPENDECTOMY  1965   CARDIAC CATHETERIZATION N/A 09/26/2015   Procedure: Left Heart Cath and Coronary Angiography;  Surgeon: Debby DELENA Sor, MD;  Location: [facility] INVASIVE CV LAB;  Service: Cardiovascular;  Laterality: N/A;   CARDIAC CATHETERIZATION N/A 10/22/2015   Procedure: Left Heart Cath and Coronary Angiography;  Surgeon: Ozell Fell, MD;  Location: [facility] INVASIVE CV LAB;  Service: Cardiovascular;  Laterality: N/A;   CATARACT EXTRACTION W/ INTRAOCULAR LENS  IMPLANT, BILATERAL  2017   COLONOSCOPY     CORONARY ARTERY BYPASS GRAFT N/A 10/02/2015   Procedure: CORONARY ARTERY BYPASS GRAFTING (CABG) X5 LIMA-LAD; SVG-DIAG; SVG-OM; SVG-PD; SVG-RAMUS TRANSESOPHAGEAL ECHOCARDIOGRAM (TEE) ENDOSCOPIC GREATER SAPHENOUS VEIN  HARVEST BILAT LE;  Surgeon: Maude Fleeta Ochoa, MD;  Location: [facility] OR;  Service: Open Heart Surgery;  Laterality: N/A;   CYSTOSCOPY/URETEROSCOPY/HOLMIUM LASER/STENT PLACEMENT Left 08/10/2018   Procedure: CYSTOSCOPY/URETEROSCOPY/HOLMIUM LASER/STENT  PLACEMENT;  Surgeon: Nieves Cough, MD;  [facility]  Service: Urology;  Laterality: Left;   CYSTOSCOPY/URETEROSCOPY/HOLMIUM LASER/STENT PLACEMENT Left 09/10/2018   Procedure: CYSTOSCOPY/URETEROSCOPY/HOLMIUM LASER/STENT EXCHANGE;  Surgeon: Nieves Cough, MD;  Location: [facility] ORS;  Service: Urology;  Laterality: Left;   HIP ARTHROPLASTY Right 12/26/2022   Procedure: RIGHT HEMI HIP ARTHROPLASTY;  Surgeon: Edna Toribio DELENA, MD;  Location: [facility] OR;  Service: Orthopedics;  Laterality: Right;   LEFT HEART CATHETERIZATION WITH CORONARY ANGIOGRAM N/A 04/13/2014   Procedure: LEFT HEART CATHETERIZATION WITH CORONARY ANGIOGRAM;  Surgeon: Elsie GORMAN Somerset, MD;  Location: [facility] CATH LAB;  Service: Cardiovascular;  Laterality: N/A;   LEG SURGERY Right age 58   closed reduction leg fracture   NASAL SEPTOPLASTY W/ TURBINOPLASTY Bilateral 08/30/2021   Procedure: NASAL SEPTOPLASTY WITH BILATERAL INFERIOR TURBINATE REDUCTION;  Surgeon: Mable Lenis, MD;  Location[facility] OR;  Service: ENT;  Laterality: Bilateral;   POLYPECTOMY     RIGHT HEART CATH N/A 06/06/2021   Procedure: RIGHT HEART CATH;  Surgeon: Cherrie Toribio SAUNDERS, MD;  Location: [facility] INVASIVE CV LAB;  Service: Cardiovascular;  Laterality: N/A;   TEE WITHOUT CARDIOVERSION N/A 10/02/2015   Procedure: TRANSESOPHAGEAL ECHOCARDIOGRAM (TEE);  Surgeon: Maude Fleeta Ochoa, MD;  Location[facility] OR;  Service: Open Heart Surgery;  Laterality: N/A;   URETEROSCOPY WITH HOLMIUM LASER LITHOTRIPSY Bilateral 2004;  2005  dr grapey  @[facility]    VASECTOMY       Social History:  reports that he has never smoked. He has never used smokeless tobacco. He reports that he does not currently use alcohol . He reports that he does not use drugs.   Family History:  His family history includes Diabetes in his brother and brother; Heart attack in his father and mother; Pancreatic cancer in his brother. There is no history of Colon cancer, Esophageal cancer, Prostate  cancer, Rectal cancer, Stomach cancer, or Colon polyps.   Allergies Allergies[1]   Home Medications  Prior to Admission medications  Medication Sig Start Date End Date Taking? Authorizing Provider  amLODipine  (NORVASC ) 5 MG tablet Take 1 tablet (5 mg total) by mouth daily. 07/01/24  Yes Gonfa, Taye T, MD  apixaban  (ELIQUIS ) 2.5 MG TABS tablet Take 1 tablet (2.5 mg total) by mouth 2 (two) times daily. 07/01/24  Yes Gonfa, Taye T, MD  cyanocobalamin  (VITAMIN B12) 1000 MCG/ML injection Inject 1,000 mcg into the muscle once a week. Inject on Saturday   Yes [provider]  Evolocumab  (REPATHA  SURECLICK) 140 MG/ML SOAJ Inject 140 mg into the skin every 14 (fourteen) days. 08/03/24  Yes Conte, Tessa N, [name]  furosemide  (LASIX ) 40 MG tablet Take 1 tablet (40 mg total) by mouth 3 (three) times a week. Take one tablet (40mg ) by mouth three times weekly on Monday, Wednesday, and Friday. Patient taking differently: Take 40 mg by mouth 3 (three) times a week. Take one tablet (40mg ) by mouth three times weekly on Monday, Wednesday, and Friday.occasionally daily 03/04/24  Yes Patsy Lenis, MD  GUAIFENESIN -CODEINE  PO Take 1 Dose by mouth as needed.   Yes [provider]  hydrALAZINE  (APRESOLINE ) 25 MG tablet Take 1 tablet (25 mg total) by mouth daily as needed (Hypertension). Take one tablet (25mg ) by mouth daily as needed if SBP is >160 03/04/24  Yes Patsy Lenis, MD  insulin  lispro (HUMALOG ) 100 UNIT/ML KwikPen Inject 8 Units into the skin 3 (three) times daily before meals. 05/15/23  Yes [provider]  lamoTRIgine  (LAMICTAL ) 25 MG tablet Take 25 mg by mouth 2 (two) times daily. 02/18/24  Yes [provider]  LANTUS  SOLOSTAR 100 UNIT/ML Solostar Pen Inject 36 Units into the skin daily before breakfast. 02/19/23  Yes [provider]  latanoprost  (XALATAN ) 0.005 % ophthalmic solution Place 1 drop into both eyes at bedtime.   Yes [provider]  losartan   (COZAAR ) 25 MG tablet Take 25 mg by mouth at bedtime. 07/28/24  Yes [provider]  mirabegron  ER (MYRBETRIQ ) 50 MG TB24 tablet Take 50 mg by mouth at bedtime. 06/08/24  Yes [provider]  nitroGLYCERIN  (NITROSTAT ) 0.4 MG SL tablet Place 1 tablet (0.4 mg total) under the tongue every 5 (five) minutes as needed for chest pain. 08/12/24 08/12/25 Yes Meng, Hao, PA  ondansetron  (ZOFRAN -ODT) 4 MG disintegrating tablet Take 1 tablet (4 mg total) by mouth every 8 (eight) hours as needed. 03/06/24  Yes Melvenia Motto, MD  pantoprazole  (PROTONIX ) 40 MG tablet TAKE 1 TABLET BY MOUTH EVERY DAY 11/25/19  Yes Abran Norleen SAILOR, MD  QUEtiapine  (SEROQUEL ) 25 MG tablet Take 1 tablet (25 mg total) by mouth at bedtime. 07/01/24  Yes Gonfa, Taye T, MD  tamsulosin  (FLOMAX ) 0.4 MG CAPS capsule Take 0.4 mg by mouth daily. 01/10/24  Yes [provider]  TRADJENTA  5 MG TABS tablet Take 5 mg by mouth daily.   Yes [provider]  Continuous Glucose Receiver (FREESTYLE LIBRE 3 READER) DEVI USE TO TEST BLOOD SUGAR 03/07/24  [provider]  Continuous Glucose Sensor (FREESTYLE LIBRE 3 PLUS SENSOR) MISC CHANGE EVERY 15 DAYS AS RECOMMENDED BY THE MANUFACTURER 90 DAYS 03/02/24   [provider]      Sammi Gore, PA - [facility] For pager details, please see AMION or use Epic chat  After 1900, please call [facility] for cross coverage needs 09/28/2024, 4:25 PM       [1]  Allergies Allergen Reactions   Cilostazol  Swelling and Other (See Comments)    Edema    Magnesium -Containing Compounds Other (See Comments)    Avoid magnesium  with hx myasthenia gravis (can worsen muscle weakness and trigger a severe exacerbation)   Donepezil  Other (See Comments)    seizure   Statins Other (See Comments)    Makes myasthenia gravis worse or flare up   Tirzepatide Other (See Comments)    Unknown    Dulaglutide Nausea And Vomiting and Other (See Comments)     TRULICITY   Levofloxacin Hives, Itching and Rash   Liraglutide Other (See Comments)    Severe fatigue & insomnia   Lisinopril Itching, Rash and Cough   "

## 2024-09-29 ENCOUNTER — Inpatient Hospital Stay (HOSPITAL_COMMUNITY)

## 2024-09-29 DIAGNOSIS — I10 Essential (primary) hypertension: Secondary | ICD-10-CM | POA: Diagnosis not present

## 2024-09-29 DIAGNOSIS — A419 Sepsis, unspecified organism: Secondary | ICD-10-CM | POA: Diagnosis not present

## 2024-09-29 DIAGNOSIS — G7000 Myasthenia gravis without (acute) exacerbation: Secondary | ICD-10-CM | POA: Diagnosis not present

## 2024-09-29 DIAGNOSIS — I480 Paroxysmal atrial fibrillation: Secondary | ICD-10-CM | POA: Diagnosis not present

## 2024-09-29 DIAGNOSIS — J189 Pneumonia, unspecified organism: Secondary | ICD-10-CM | POA: Diagnosis not present

## 2024-09-29 LAB — BASIC METABOLIC PANEL WITH GFR
Anion gap: 12 (ref 5–15)
BUN: 48 mg/dL — ABNORMAL HIGH (ref 8–23)
CO2: 20 mmol/L — ABNORMAL LOW (ref 22–32)
Calcium: 8.1 mg/dL — ABNORMAL LOW (ref 8.9–10.3)
Chloride: 102 mmol/L (ref 98–111)
Creatinine, Ser: 2.73 mg/dL — ABNORMAL HIGH (ref 0.61–1.24)
GFR, Estimated: 23 mL/min — ABNORMAL LOW
Glucose, Bld: 302 mg/dL — ABNORMAL HIGH (ref 70–99)
Potassium: 4.7 mmol/L (ref 3.5–5.1)
Sodium: 135 mmol/L (ref 135–145)

## 2024-09-29 LAB — GLUCOSE, CAPILLARY
Glucose-Capillary: 326 mg/dL — ABNORMAL HIGH (ref 70–99)
Glucose-Capillary: 331 mg/dL — ABNORMAL HIGH (ref 70–99)
Glucose-Capillary: 352 mg/dL — ABNORMAL HIGH (ref 70–99)
Glucose-Capillary: 288 mg/dL — ABNORMAL HIGH (ref 70–99)

## 2024-09-29 MED ORDER — SODIUM CHLORIDE 0.9 % IV SOLN
3.0000 g | Freq: Two times a day (BID) | INTRAVENOUS | Status: DC
  Administered 2024-09-29 – 2024-10-01 (×5): 3 g via INTRAVENOUS
  Filled 2024-09-29 (×5): qty 8

## 2024-09-29 MED ORDER — PREDNISONE 20 MG PO TABS
40.0000 mg | ORAL_TABLET | Freq: Every day | ORAL | Status: AC
  Administered 2024-09-29 – 2024-09-30 (×2): 40 mg via ORAL
  Filled 2024-09-29 (×2): qty 2

## 2024-09-29 NOTE — Progress Notes (Signed)
 Patient performed NIF and VC with good effort  NIF -40  VC 0.9L

## 2024-09-29 NOTE — Consult Note (Signed)
 " Palliative Medicine Inpatient Consult Note  Consulting Provider: Dr. Raenelle  Reason for consult:   Palliative Care Consult Services Palliative Medicine Consult  Reason for Consult? goc    09/29/2024  HPI:  Per intake H&P --> Fernando Hess is an 79 year old male with a past medical history significant for paroxysmal atrial fibrillation for which he is on Eliquis , coronary artery disease, diastolic heart failure, stage IV chronic kidney disease, sleep apnea, dementia, dyslipidemia, hypertension, type 2 diabetes mellitus, GERD, and right bundle branch block.  Capone was admitted to the hospital on 13 January in the setting of weakness.  Since hospitalization Wash has been identified to have hypoxemic respiratory failure thought to be related to aspiration pneumonia.  The palliative care team has been asked to support additional goals of care conversations.  Clinical Assessment/Goals of Care:  I have reviewed medical records including EPIC notes of Deward Gore, PT - [facility], RT - Carolyn Espy, SLP - Norleen Blase,  [place] - Orland Born, Dr. Raenelle, labs  BMP from this morning w/ Cr at 2.73, Glucose 302 and imaging CXR from this morning w/o opacification(s), received report from bedside RN, assessed the patient who is lying in bed awake and alert. He is taken to the [facility] during my time with him. I shared the plan to call his wife.    I called patients wife, Cy to further discuss diagnosis prognosis, GOC, EOL wishes, disposition and options.   I introduced Palliative Medicine as specialized medical care for people living with serious illness. It focuses on providing relief from the symptoms and stress of a serious illness. The goal is to improve quality of life for both the patient and the family.  Medical History Review and Understanding:  A review of Fernando Hess's past medical history significant for paroxysmal atrial fibrillation, coronary artery disease, myasthenia gravis (had failed prior  treatments), type 2 diabetes, GERD, right bundle branch block, and diastolic heart failure was completed.  Social History:  Trinity is from [place], [place] .  He is married to [place].  He has 3 children.  He is a former Midwife.  He gets enjoyment out of spending time with his family and maintaining a degree of independence/activity.  He is a man of faith practicing within Christianity.  Functional and Nutritional State:  Preceding hospitalization Jayron was living at home and does have home caregivers come in to support bathing.  He does utilize a walker for mobility.  He has not had any recent deviations in his appetite per his wife.  Advance Directives:  A detailed discussion was had today regarding advanced directives.  Fernando Hess's surrogate decision maker is his spouse Slater he does not have active advance directives on file.  Code Status:  Concepts specific to code status, artifical feeding and hydration, continued IV antibiotics and rehospitalization was had.  The difference between a aggressive medical intervention path  and a palliative comfort care path for this patient at this time was had.   There is an established DO NOT RESUSCITATE DO NOT INTUBATE CODE STATUS.  Discussion:  The reason contributing to Fernando Hess's hospitalization was discussed with his wife.  We reviewed that Fernando Hess came from home in the setting of weakness.  He had endorsed 2 falls in the last 3 days 1 of which resulted in him hitting his head.  We discussed that he had developed a fever while in the hospital and had been identified to have an aspiration pneumonia therefore additional swallow studies are pending.  Patient's wife shares with me that he and she tended to switch off and he was caring for him.  She shares his health had deteriorated in 2024 when additional caregivers had to come in more frequently.  He was having decreased ability and increased falls.  Per his neurology specialist he had been  diagnosed with myasthenia gravis.  He has since this diagnosis had episodes of improvements and declines.  Patient's wife shares understanding and that this is an expectation with this disease.  She also shares that patient is anticipated to worsen over time and we can in the setting of this disease process.  Patient's wife at this time is most concerned with patient's falls and if something contributed to them inclusive of potential medications.  We reviewed medications over the phone that he is getting while he is hospitalized.  I shared I could also reach out to the pharmacy team to offer insight on polypharmacy and if any of his medications might be causing her falls.  We reviewed the concern of aspiration pneumonia in patients who have cognitive decline dementia more specifically have decreased coordination with swallow.  We reviewed if this is identified by the speech pathology team that sometimes it can be difficult diagnosis without care.  I shared if this is the case then we might be talking about Ivor potentially having recurrent hospitalizations in the setting of aspirational events.  We discussed if that is the case considering outpatient palliative care versus hospice care.  I was able to explain the differences between these 2 service lines to her:   Palliative care is specialized medical care for people living with a serious illness, such as cancer, heart failure, COPD, Alzheimer's dementia, etc. Patients in palliative care may receive medical care for their symptoms, or palliative care, along with treatment intended to cure their serious illness   Hospice care focuses on the care, comfort, and quality of life of a person with a serious illness who is approaching the end of life. At some point, it may not be possible to cure a serious illness, or a patient may choose not to undergo certain treatments. Hospice is designed for this situation.  Patient's wife does not like the idea of hospice  as she would not be able to take Fernando Hess back to the hospital.  She shares wanting to be able to have hospitalization as her Lifeline to support him if his health worsens.  She does understand in the setting of a terminal illness or disease that this may need to change.  Cy is hopeful for improvement in overall function and for eventually Teejay to transition back home.  She would like to allow time for outcomes at this point.  Discussed the importance of continued conversation with family and their  medical providers regarding overall plan of care and treatment options, ensuring decisions are within the context of the patients values and GOCs.  Decision Maker: Dee Olive Johann, Emergency Contact: [phone] (Home Phone)   SUMMARY OF RECOMMENDATIONS   DNAR/DNI  Continue current care allowing time for outcomes  Review of potential progression of myasthenia gravis discussed with wife  Education on aspiration and potential for recurrence discussed with patient's wife  Education on outpatient palliative support and hospice booklet discussed with patient's wife  Ongoing palliative support as needed  Code Status/Advance Care Planning: DNAR/DNI  Palliative Prophylaxis:  Aspiration, Bowel Regimen, Delirium Protocol, Frequent Pain Assessment, Oral Care, Palliative Wound Care, and Turn Reposition  Additional Recommendations (Limitations, Scope, Preferences): Continue  current care  Psycho-social/Spiritual:  Desire for further Chaplaincy support: Yes Additional Recommendations: Education on aspirational events myasthenia gravis   Prognosis: Patient continues to fall and has recurrently hospitalizations also has a significant chronic comorbidities placing her at her 79-month mortality risk.  Discharge Planning: Discharge plan to be determined.  Vitals:   09/29/24 0427 09/29/24 0618  BP: 132/63 (!) 116/57  Pulse: 70   Resp: 20   Temp: 97.6 F (36.4 C)   SpO2: 96%      Intake/Output Summary (Last 24 hours) at 09/29/2024 0717 Last data filed at 09/28/2024 0830 Gross per 24 hour  Intake 1033.55 ml  Output --  Net 1033.55 ml   Last Weight  Most recent update: 09/28/2024 12:06 PM    Weight  82 kg (180 lb 12.4 oz)             LABS: CBC:    Component Value Date/Time   WBC 14.4 (H) 09/28/2024 0511   HGB 9.6 (L) 09/28/2024 0511   HGB 15.8 05/31/2021 1438   HCT 30.3 (L) 09/28/2024 0511   HCT 45.2 05/31/2021 1438   PLT 178 09/28/2024 0511   PLT 251 05/31/2021 1438   MCV 85.4 09/28/2024 0511   MCV 87 05/31/2021 1438   NEUTROABS 11.0 (H) 09/28/2024 0511   LYMPHSABS 1.4 09/28/2024 0511   MONOABS 1.7 (H) 09/28/2024 0511   EOSABS 0.2 09/28/2024 0511   BASOSABS 0.1 09/28/2024 0511   Comprehensive Metabolic Panel:    Component Value Date/Time   NA 135 09/29/2024 0212   NA 138 11/06/2021 1031   K 4.7 09/29/2024 0212   CL 102 09/29/2024 0212   CO2 20 (L) 09/29/2024 0212   BUN 48 (H) 09/29/2024 0212   BUN 34 (H) 11/06/2021 1031   CREATININE 2.73 (H) 09/29/2024 0212   GLUCOSE 302 (H) 09/29/2024 0212   CALCIUM  8.1 (L) 09/29/2024 0212   AST 16 09/28/2024 0511   ALT 7 09/28/2024 0511   ALKPHOS 73 09/28/2024 0511   BILITOT 0.6 09/28/2024 0511   BILITOT 0.5 01/22/2022 0815   PROT 4.3 (L) 09/28/2024 0511   PROT 6.5 01/22/2022 0815   ALBUMIN  2.4 (L) 09/28/2024 0511   ALBUMIN  4.1 01/22/2022 0815   Gen: Elderly chronically ill-appearing Caucasian male HEENT: moist mucous membranes CV: Regular rate and rhythm  PULM: On 5 L nasal cannula breathing is even nonlabored ABD: soft/nontender  EXT: No edema  Neuro: Alert and oriented x3   PPS: 40%   This conversation/these recommendations were discussed with patient primary care team, Dr. Raenelle ______________________________________________________ Rosaline Becton [facility] Palliative Medicine Team Team Cell Phone: [phone] Please utilize secure chat with additional questions, if  there is no response within 30 minutes please call the above phone number  I personally spent a total of 75 minutes in the care of the patient today including preparing to see the patient, getting/reviewing separately obtained history, performing a medically appropriate exam/evaluation, counseling and educating, placing orders, referring and communicating with other health care professionals, documenting clinical information in the EHR, independently interpreting results, and coordinating care.  "

## 2024-09-29 NOTE — Progress Notes (Signed)
 " PROGRESS NOTE    KATHRYN LINAREZ  FMW:[id number] DOB: 1946-05-30 DOA: 09/27/2024 PCP: Okey Carlin Redbird, MD (Confirm with patient/family/NH records and if not entered, this HAS to be entered at [facility] point of entry. No PCP if truly none.)   Chief Complaint  Patient presents with   Weakness    Brief Narrative:   Kasheem Toner is an 79 year old male with past medical history of hypertension, HFpEF, paroxsymal atrial fibrillation on Eliquis , CKD Stage IV, suspected myasthenia gravis, diabetes mellitus type 2, CAD, dementia, acute encephalopathy, and amenia of chronic disease. He presented to the ED with shortness of breath, generalized weakness, lightheadedness x 3 days with 2 falls at home (1 resulting in head injury). He also reports an episode of choking and vomiting after trying to swallow food. He was found to have aspiration pneumonia.    Assessment & Plan: Aspiration Pneumonia Secondary to choking on food and vomiting.  CT chest/abdomen/pelvis: right pleural effusion, RUL pneumonia, LUL opacity.  Awaiting blood culture.  Improving (SpO2 88% room air, normal respiratory effort) Continue Unasyn . Switch BiPAP to nasal cannula.  Essential Hypertension Not well-controlled (SBP 150-116).  Continue diltiazem  and hydralazine .  CAD S/P CABG x 5. Troponin 73.  Stable (denies chest pain).  Continue Eliquis  and nitroglycerin .   CKD Stage IV GFR 23, Creatinine 2.73 Start furosemide .   HFpEF Pitting edema. No JVD.  Continue hydralazine  and diltiazem . Start furosemide .  Paroxysmal Atrial Fibrillation CHA2DS2-VASc > 3.  Continue Eliquis  for anticoagulation.  Continue diltiazem  for rate and rhythm control.  Type 2 Diabetes Mellitus Uncontrolled (326 blood glucose) Continue insulin .     DVT prophylaxis: apixaban   Code Status: Do not attempt resuscitation (DNR) -DNR-LIMITED -Do Not Intubate/DNI  Family Communication: none at bedside  Disposition:   Status is: Inpatient Remains  inpatient appropriate because: severity of illness    Consultants:   Procedures:   Antimicrobials: ampicillin -sulbactam    Subjective:  Patient is breathing comfortably on room air. Alert & oriented. Talkative. NAD.   Objective: Vitals:   09/29/24 0400 09/29/24 0427 09/29/24 0618 09/29/24 0749  BP:  132/63 (!) 116/57   Pulse: 69 70    Resp: 17 20    Temp:  97.6 F (36.4 C)  98.1 F (36.7 C)  TempSrc:  Axillary  Oral  SpO2: 97% 96%    Weight:      Height:        Intake/Output Summary (Last 24 hours) at 09/29/2024 0829 Last data filed at 09/28/2024 0830 Gross per 24 hour  Intake 835.95 ml  Output --  Net 835.95 ml   Filed Weights   09/28/24 1104  Weight: 82 kg    Examination:  General exam: Appears calm and comfortable.  Respiratory system: Left lung crackles on auscultation. Respiratory effort normal. Cardiovascular system: S1 & S2 heard, RRR. No JVD, murmurs, rubs, gallops or clicks. Bilateral lower extremity edema. Gastrointestinal system: Abdomen is nondistended, soft and nontender. No organomegaly or masses felt. Normal bowel sounds heard. Central nervous system: Alert and oriented. No focal neurological deficits. Extremities: Symmetric 5 x 5 power. Skin: No rashes, lesions or ulcers Psychiatry: Judgement and insight appear normal. Mood & affect appropriate.     Data Reviewed: I have personally reviewed following labs and imaging studies.  CBC: Recent Labs  Lab 09/27/24 1035 09/27/24 1100 09/28/24 0511  WBC 18.1*  --  14.4*  NEUTROABS 15.4*  --  11.0*  HGB 10.4* 10.5* 9.6*  HCT 32.8* 31.0* 30.3*  MCV  86.3  --  85.4  PLT 200  --  178    Basic Metabolic Panel: Recent Labs  Lab 09/27/24 1035 09/27/24 1100 09/28/24 0511 09/29/24 0212  NA 139 140 146* 135  K 4.2 4.2 3.0* 4.7  CL 107 107 118* 102  CO2 21*  --  19* 20*  GLUCOSE 339* 336* 147* 302*  BUN 34* 40* 33* 48*  CREATININE 2.16* 2.20* 1.86* 2.73*  CALCIUM  7.5*  --  5.9* 8.1*  MG  --    --  1.6*  --     GFR: Estimated Creatinine Clearance: 21.7 mL/min (A) (by C-G formula based on SCr of 2.73 mg/dL (H)).  Liver Function Tests: Recent Labs  Lab 09/27/24 1035 09/28/24 0511  AST 17 16  ALT 8 7  ALKPHOS 105 73  BILITOT 1.1 0.6  PROT 5.7* 4.3*  ALBUMIN  3.1* 2.4*    CBG: Recent Labs  Lab 09/28/24 0824 09/28/24 1249 09/28/24 1616 09/28/24 2031 09/29/24 0748  GLUCAP 181* 263* 329* 190* 326*     Recent Results (from the past 240 hours)  Resp panel by RT-PCR (RSV, Flu A&B, Covid) Anterior Nasal Swab     Status: None   Collection Time: 09/27/24 12:15 PM   Specimen: Anterior Nasal Swab  Result Value Ref Range Status   SARS Coronavirus 2 by RT PCR NEGATIVE NEGATIVE Final   Influenza A by PCR NEGATIVE NEGATIVE Final   Influenza B by PCR NEGATIVE NEGATIVE Final    Comment: (NOTE) The Xpert Xpress SARS-CoV-2/FLU/RSV plus assay is intended as an aid in the diagnosis of influenza from Nasopharyngeal swab specimens and should not be used as a sole basis for treatment. Nasal washings and aspirates are unacceptable for Xpert Xpress SARS-CoV-2/FLU/RSV testing.  Fact Sheet for Patients: bloggercourse.com  Fact Sheet for Healthcare Providers: seriousbroker.it  This test is not yet approved or cleared by the [place]  FDA and has been authorized for detection and/or diagnosis of SARS-CoV-2 by FDA under an Emergency Use Authorization (EUA). This EUA will remain in effect (meaning this test can be used) for the duration of the COVID-19 declaration under Section 564(b)(1) of the Act, 21 U.S.C. section 360bbb-3(b)(1), unless the authorization is terminated or revoked.     Resp Syncytial Virus by PCR NEGATIVE NEGATIVE Final    Comment: (NOTE) Fact Sheet for Patients: bloggercourse.com  Fact Sheet for Healthcare Providers: seriousbroker.it  This test is not  yet approved or cleared by the [place]  FDA and has been authorized for detection and/or diagnosis of SARS-CoV-2 by FDA under an Emergency Use Authorization (EUA). This EUA will remain in effect (meaning this test can be used) for the duration of the COVID-19 declaration under Section 564(b)(1) of the Act, 21 U.S.C. section 360bbb-3(b)(1), unless the authorization is terminated or revoked.  Performed at [facility], [address] [place], [name] [address]   Blood culture (routine x 2)     Status: None (Preliminary result)   Collection Time: 09/27/24  1:24 PM   Specimen: BLOOD  Result Value Ref Range Status   Specimen Description BLOOD RIGHT ANTECUBITAL  Final   Special Requests   Final    BOTTLES DRAWN AEROBIC AND ANAEROBIC Blood Culture adequate volume   Culture   Final    NO GROWTH < 24 HOURS Performed at [facility], [address], [place], [name] [address]    Report Status PENDING  Incomplete  Blood culture (routine x 2)     Status:  None (Preliminary result)   Collection Time: 09/27/24  1:29 PM   Specimen: BLOOD RIGHT HAND  Result Value Ref Range Status   Specimen Description BLOOD RIGHT HAND  Final   Special Requests   Final    BOTTLES DRAWN AEROBIC AND ANAEROBIC Blood Culture adequate volume   Culture   Final    NO GROWTH < 24 HOURS Performed at [facility], [address] [place], [name] [address]    Report Status PENDING  Incomplete  MRSA Next Gen by PCR, Nasal     Status: None   Collection Time: 09/27/24  2:58 PM   Specimen: Nasal Mucosa; Nasal Swab  Result Value Ref Range Status   MRSA by PCR Next Gen NOT DETECTED NOT DETECTED Final    Comment: (NOTE) The GeneXpert MRSA Assay (FDA approved for NASAL specimens only), is one component of a comprehensive MRSA colonization surveillance program. It is not intended to diagnose MRSA infection nor to guide or monitor treatment for MRSA infections. Test performance is not FDA approved in  patients less than 71 years old. Performed at [facility], [address] [place], [name] [address]   Respiratory (~20 pathogens) panel by PCR     Status: None   Collection Time: 09/27/24  3:20 PM   Specimen: Nasopharyngeal Swab; Respiratory  Result Value Ref Range Status   Adenovirus NOT DETECTED NOT DETECTED Final   Coronavirus 229E NOT DETECTED NOT DETECTED Final    Comment: (NOTE) The Coronavirus on the Respiratory Panel, DOES NOT test for the novel  Coronavirus (2019 nCoV)    Coronavirus HKU1 NOT DETECTED NOT DETECTED Final   Coronavirus NL63 NOT DETECTED NOT DETECTED Final   Coronavirus OC43 NOT DETECTED NOT DETECTED Final   Metapneumovirus NOT DETECTED NOT DETECTED Final   Rhinovirus / Enterovirus NOT DETECTED NOT DETECTED Final   Influenza A NOT DETECTED NOT DETECTED Final   Influenza B NOT DETECTED NOT DETECTED Final   Parainfluenza Virus 1 NOT DETECTED NOT DETECTED Final   Parainfluenza Virus 2 NOT DETECTED NOT DETECTED Final   Parainfluenza Virus 3 NOT DETECTED NOT DETECTED Final   Parainfluenza Virus 4 NOT DETECTED NOT DETECTED Final   Respiratory Syncytial Virus NOT DETECTED NOT DETECTED Final   Bordetella pertussis NOT DETECTED NOT DETECTED Final   Bordetella Parapertussis NOT DETECTED NOT DETECTED Final   Chlamydophila pneumoniae NOT DETECTED NOT DETECTED Final   Mycoplasma pneumoniae NOT DETECTED NOT DETECTED Final    Comment: Performed at [facility], [address] [place], [name] [address]       Radiology Studies: [facility] Chest [place] 1V same Day Result Date: 09/29/2024 EXAM: 1 VIEW(S) XRAY OF THE CHEST 09/29/2024 07:10:00 AM COMPARISON: 09/27/2024 CLINICAL HISTORY: SOB (shortness of breath) FINDINGS: LUNGS AND PLEURA: Low lung volumes. Increased diffuse interstitial markings. No focal pulmonary opacity. No pleural effusion. No pneumothorax. HEART AND MEDIASTINUM: CABG markers noted. Median sternotomy wires noted. No acute abnormality of the cardiac  and mediastinal silhouettes. BONES AND SOFT TISSUES: No acute osseous abnormality. IMPRESSION: 1. Low lung volumes with increased diffuse interstitial markings. 2. Postsurgical changes of prior CABG/median sternotomy. Electronically signed by: Evalene Coho MD 09/29/2024 07:32 AM EST RP Workstation: [id number]   CT CHEST ABDOMEN PELVIS WO CONTRAST Result Date: 09/27/2024 CLINICAL DATA:  Weakness, dizziness, fall. EXAM: CT CHEST, ABDOMEN AND PELVIS WITHOUT CONTRAST TECHNIQUE: Multidetector CT imaging of the chest, abdomen and pelvis was performed following the standard protocol without IV contrast. RADIATION DOSE REDUCTION:  This exam was performed according to the departmental dose-optimization program which includes automated exposure control, adjustment of the mA and/or kV according to patient size and/or use of iterative reconstruction technique. COMPARISON:  March 06, 2024. FINDINGS: CT CHEST FINDINGS Cardiovascular: Status post coronary artery bypass graft. Mild cardiomegaly. No pericardial effusion. No definite thoracic aortic aneurysm. Mediastinum/Nodes: No enlarged mediastinal, hilar, or axillary lymph nodes. Thyroid  gland, trachea, and esophagus demonstrate no significant findings. Lungs/Pleura: No pneumothorax is noted. Minimal right pleural effusion is noted. Airspace opacity is noted posteriorly in right upper lobe concerning for possible pneumonia. Smaller opacity is also noted in left upper lobe concerning for possible pneumonia. Musculoskeletal: No chest wall mass or suspicious bone lesions identified. CT ABDOMEN PELVIS FINDINGS Hepatobiliary: No focal liver abnormality is seen. No gallstones, gallbladder wall thickening, or biliary dilatation. Pancreas: Unremarkable. No pancreatic ductal dilatation or surrounding inflammatory changes. Spleen: Normal in size without focal abnormality. Adrenals/Urinary Tract: Adrenal glands appear normal. Small nonobstructive right renal calculus is noted. 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 Left nephrolithiasis is noted as well. No definite hydronephrosis or renal obstruction is noted. Urinary bladder is unremarkable. Stomach/Bowel: Status post appendectomy. Wall thickening of proximal stomach is noted which may represent lack of distension, but gastritis cannot be excluded. There is no evidence of bowel obstruction. Mild to moderate amount of stool seen in the sigmoid colon and rectum. Vascular/Lymphatic: Aortic atherosclerosis. No enlarged abdominal or pelvic lymph nodes. Reproductive: Prostate is unremarkable. Other: No abdominal wall hernia or abnormality. No abdominopelvic ascites. Musculoskeletal: Status post right total hip arthroplasty. No acute osseous abnormality is noted. IMPRESSION: 1. Airspace opacity is noted posteriorly in right upper lobe concerning for pneumonia. Smaller opacity is noted in left upper lobe as well. 2. Minimal right pleural effusion is noted. 3. Wall thickening of proximal stomach is noted which may represent lack of distension, but gastritis cannot be excluded. 4. Bilateral nonobstructive nephrolithiasis. 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 5. Aortic atherosclerosis. Aortic Atherosclerosis (ICD10-I70.0). Electronically Signed   By: Lynwood Landy Raddle M.D.   On: 09/27/2024 13:09   DG Chest 2 View Result Date: 09/27/2024 CLINICAL DATA:  Shortness of breath with recent fall. EXAM: CHEST - 2 VIEW COMPARISON:  06/30/2024 FINDINGS: Lungs are hypoinflated without focal airspace consolidation or effusion. Cardiomediastinal silhouette and remainder of the exam is unchanged. IMPRESSION: Hypoinflation without acute cardiopulmonary disease. Electronically Signed   By: Toribio Agreste M.D.   On: 09/27/2024 11:41   CT Head Wo Contrast Result Date: 09/27/2024 EXAM: CT HEAD WITHOUT CONTRAST 09/27/2024 10:39:00 AM TECHNIQUE: CT of the head was performed without the administration of intravenous contrast.  Automated exposure control, iterative reconstruction, and/or weight based adjustment of the mA/kV was utilized to reduce the radiation dose to as low as reasonably achievable. COMPARISON: CT of the head dated 07/15/2023. CLINICAL HISTORY: The patient experienced minor head trauma and is aged 31 years or older. FINDINGS: BRAIN AND VENTRICLES: No acute hemorrhage. No evidence of acute infarct. Age-related atrophy. Mild periventricular white matter disease. Remote lacunar infarcts within right basal ganglia. No hydrocephalus. No extra-axial collection. No mass effect or midline shift. ORBITS: Status post bilateral lens replacement. SINUSES: Polypoid mucosal thickening within right maxillary sinus. SOFT TISSUES AND SKULL: No acute soft tissue abnormality. No skull fracture. Moderate vascular calcifications. IMPRESSION: 1. No acute intracranial abnormality related to the minor head trauma. 2. Age-related atrophy, mild periventricular white matter disease, and remote lacunar infarcts  within the right basal ganglia. Electronically signed by: Evalene Coho MD MD 09/27/2024 10:58 AM EST RP Workstation: [id number]      Scheduled Meds:  apixaban   2.5 mg Oral BID   [START ON 10/01/2024] cyanocobalamin   1,000 mcg Intramuscular Weekly   diltiazem   90 mg Oral Q8H   insulin  aspart  0-15 Units Subcutaneous TID WC   insulin  aspart  0-5 Units Subcutaneous QHS   insulin  aspart  3 Units Subcutaneous TID WC   insulin  glargine  15 Units Subcutaneous BID   ipratropium  0.5 mg Nebulization Q6H   isosorbide  mononitrate  30 mg Oral Daily   methylPREDNISolone  (SOLU-MEDROL ) injection  40 mg Intravenous Q12H   mirabegron  ER  50 mg Oral QHS   pantoprazole   40 mg Oral Daily   potassium chloride   40 mEq Oral BID   QUEtiapine   25 mg Oral QHS   tamsulosin   0.4 mg Oral Daily   Continuous Infusions:  ampicillin -sulbactam (UNASYN ) IV     dextrose  Stopped (09/29/24 0408)     LOS: 2 days    Time spent:     Orland Born,  Student PA [facility]   To contact the attending provider between 7A-7P or the covering provider during after hours 7P-7A, please log into the web site [email or web address] and access using universal [place] password for that web site. If you do not have the password, please call the hospital operator.  09/29/2024, 8:29 AM  Patient ID: URIEL DOWDING, male   DOB: 14-Feb-1946, 79 y.o.   MRN: [id number]  "

## 2024-09-29 NOTE — Progress Notes (Deleted)
 SLP Cancellation Note  Patient Details Name: Fernando Hess MRN: [id number] DOB: 24-May-1946   Cancelled treatment:       Reason Eval/Treat Not Completed: Medical issues which prohibited therapy. MBS had been recommended for today. Since initial SLP eval on previous date, pt has been on BiPAP. Not medically appropriate to come to radiology this morning. Will f/u as able.    Leita SAILOR., M.A. CCC-SLP Acute Rehabilitation Services Office: [phone]  Secure chat preferred  09/29/2024, 8:49 AM

## 2024-09-29 NOTE — Evaluation (Signed)
 Modified Barium Swallow Study  Patient Details  Name: PHOENIX DRESSER MRN: [id number] Date of Birth: 1946/09/05  Today's Date: 09/29/2024  Modified Barium Swallow completed.  Full report located under Chart Review in the Imaging Section.  History of Present Illness Nico Syme is an 79 y.o. male who presented from home to the hospital on 09/28/23 for falls and weakness. He was admitted with acute respiratory failure with hypoxia secondary to aspiration PNA. CT chest showed Airspace opacity is noted posteriorly in right upper lobe concerning for pneumonia. Smaller opacity is noted in left upper lobe as well. PMH: dementia, CVA, myasthenia gravis, DM, CAD s/p CABG, chronic HfpEF, CKD, depression, anxiety, OSA on CPAP, a-fib.   Clinical Impression Pt has oral and pharyngeal deficits but still exhibits fairly functional swallowing in terms of efficiency and safety. Recommend resuming regular diet and thin liquids with use of aspiration precautions and taking breaks PRN for respiratory status. SLP will f/u at least briefly given respiratory status as well as clinical signs noted during initial eval.  Pt has good control when prompted to orally hold boluses, but when swallowing more spontaneously, he does have some extra repetitive movements before initiating swift posterior transit. During these extra movements there are moments of premature spillage. He also has reduced velopharyngeal closure, base of tongue retraction, and pharyngeal squeeze, but he clears his pharynx well. Vallecular residue is noted intermittently, but when there, he performs a second swallow that clears it. Even across challenging, and moments in which he sounded more short of breath, he had no aspiration or penetration. Note that he also did not exhibit signs of dysphagia that were noted clinically (throat clear only x1, no wet vocal quality observed today).    Factors that may increase risk of adverse event in presence of  aspiration Noe & Lianne 2021): Respiratory or GI disease  Swallow Evaluation Recommendations Recommendations: PO diet PO Diet Recommendation: Regular;Thin liquids (Level 0) Liquid Administration via: Cup;Straw Medication Administration: Whole meds with liquid Supervision: Staff to assist with self-feeding Swallowing strategies  : Slow rate;Small bites/sips (take breaks PRN for respiratory status) Postural changes: Position pt fully upright for meals Oral care recommendations: Oral care BID (2x/day)      Leita SAILOR., M.A. CCC-SLP Acute Rehabilitation Services Office: [phone]  Secure chat preferred  09/29/2024,11:04 AM

## 2024-09-29 NOTE — Evaluation (Signed)
 Occupational Therapy Evaluation Patient Details Name: Fernando Hess MRN: [id number] DOB: 05/26/1946 Today's Date: 09/29/2024   History of Present Illness   Pt is 79 year old presented to [facility] on  09/28/23 for falls and weakness. Pt with acute respiratory failure with hypoxia and sepsis secondary to aspiration PNA. PMH - CVA, myasthenia gravis, DM II, CAD status post CABG, chronic HFpEF, CKD IV, depression, anxiety, dementia, and OSA on CPAP, afib     Clinical Impressions At baseline, pt completes UB ADLs with Mod I to Set up, requires assistance with LB ADLs, requires assistance for all IADLs, and performs functional mobility/transfers with a Rollator with Mod I. However, pt reports multiple recent falls. Pt now presents with impaired cardiopulmonary status, decreased activity tolerance, decreased balance, decreased B UE fine motor coordination, impaired cognition (suspect at or near baseline), and decreased safety and independence with functional tasks. Pt currently demonstrating ability to complete UB ADLs with Set up to Mod assist, LB ADLs with Mod to Max assist, bed mobility with Min assist, and functional transfers with a RW with Contact guard to Min assist. Pt participated well in session and is motivated to return to PLOF, but was limited this session bt increased labor of breathing, feeling of increased SOB, and signs of anxiety with activities in standing/stepping at EOB. Pt O2 sat >/ 93% on 3L continuous O2 through nasal cannula and HR in the 70s to 80s throughout session. Pt will benefit from acute skilled OT services to address deficits and increase safety and independence with functional tasks. Post acute discharge, pt will benefit from [facility] OT paired with continued assistance/supervision of wife for safety.     If plan is discharge home, recommend the following:   A little help with walking and/or transfers;A lot of help with bathing/dressing/bathroom;Assistance with  cooking/housework;Direct supervision/assist for medications management;Direct supervision/assist for financial management;Assist for transportation;Help with stairs or ramp for entrance;Supervision due to cognitive status (set up for self-feeding)     Functional Status Assessment   Patient has had a recent decline in their functional status and demonstrates the ability to make significant improvements in function in a reasonable and predictable amount of time.     Equipment Recommendations   None recommended by OT (pt already has needed equipment)     Recommendations for Other Services         Precautions/Restrictions   Precautions Precautions: Fall;Other (comment) Recall of Precautions/Restrictions: Impaired Precaution/Restrictions Comments: Watch SpO2 Restrictions Weight Bearing Restrictions Per Provider Order: No     Mobility Bed Mobility Overal bed mobility: Needs Assistance Bed Mobility: Supine to Sit, Sit to Supine     Supine to sit: Min assist, HOB elevated, Used rails Sit to supine: Min assist   General bed mobility comments: Assist to elevate trunk into sitting. Assist to bring B LE into bed and manage trunk. Assist to scoot up in the bed. Pt able to bridge in the bed with Supervision.    Transfers Overall transfer level: Needs assistance Equipment used: Rolling walker (2 wheels) Transfers: Sit to/from Stand, Bed to chair/wheelchair/BSC Sit to Stand: Min assist     Step pivot transfers: Contact guard assist     General transfer comment: Min assist to boost up from EOB with bed on lowest setting then CGA for safety once in standing      Balance Overall balance assessment: Needs assistance Sitting-balance support: Single extremity supported, Bilateral upper extremity supported, Feet supported Sitting balance-Leahy Scale: Fair Sitting balance - Comments: able to  maintain static sit at EOB with Supervision for safety; pt with posterior lean in dynamic  sitting with pt able to self correct with verbal cues Postural control: Posterior lean (in dynamic sitting with pt able to self correct with verbal cues) Standing balance support: Single extremity supported, Bilateral upper extremity supported, During functional activity, Reliant on assistive device for balance Standing balance-Leahy Scale: Poor Standing balance comment: Pt reliant on unilateral UE support and CGA for safety in static and dynamic standing                           ADL either performed or assessed with clinical judgement   ADL Overall ADL's : Needs assistance/impaired Eating/Feeding: Set up;Sitting;Bed level Eating/Feeding Details (indicate cue type and reason): pt with some spillage of food noted while finishing lunch Grooming: Set up;Sitting;Bed level;Cueing for safety;Cueing for compensatory techniques   Upper Body Bathing: Minimal assistance;Moderate assistance;Cueing for safety;Cueing for compensatory techniques;Bed level   Lower Body Bathing: Maximal assistance;Sit to/from stand;Cueing for compensatory techniques;Cueing for safety   Upper Body Dressing : Contact guard assist;Sitting   Lower Body Dressing: Moderate assistance;Maximal assistance;Sitting/lateral leans;Sit to/from stand;Cueing for compensatory techniques;Cueing for safety   Toilet Transfer: Minimal assistance;Rolling walker (2 wheels);Cueing for sequencing;Cueing for safety;BSC/3in1 Toilet Transfer Details (indicate cue type and reason): simulated at EOB         Functional mobility during ADLs:  (deferred for pt/therapist safety due to pt with increased labor of breathing (with O2 sat stable on 3L continuous O2) in activities in standing/stepping at EOB) General ADL Comments: Pt limited by decreased activity tolerance and increased labor of breathing and increased feeling of SOB with activity with O2 sat stable on 3 L     Vision Baseline Vision/History: 1 Wears glasses (pt reports  bifocals; glasses not present during session) Ability to See in Adequate Light: 0 Adequate Patient Visual Report: No change from baseline Additional Comments: Vision largely [facility] for tasks assessed. Some difficulty reading small print. Vision not formally screened or evaluated     Perception         Praxis         Pertinent Vitals/Pain Pain Assessment Pain Assessment: No/denies pain     Extremity/Trunk Assessment Upper Extremity Assessment Upper Extremity Assessment: Right hand dominant;RUE deficits/detail;LUE deficits/detail RUE Deficits / Details: gross strength 4/5; decreased fine motor coordination; AROM shoulder flexion to approximately 110 degrees; all other AROM WFL; sensation WFL RUE Coordination: decreased fine motor LUE Deficits / Details: gross strength 4- to 4/5; decreased fine motor coordination; AROM shoulder flexion to approximately 100 degrees; all other AROM WFL; sensation WFL LUE Coordination: decreased fine motor   Lower Extremity Assessment Lower Extremity Assessment: Defer to PT evaluation   Cervical / Trunk Assessment Cervical / Trunk Assessment: Kyphotic   Communication Communication Communication: No apparent difficulties   Cognition Arousal: Alert Behavior During Therapy: WFL for tasks assessed/performed, Anxious (Largely WFL but with intermittent signs of anxiety during tasks with increased labor of breathing) Cognition: History of cognitive impairments, Cognition impaired, No family/caregiver present to determine baseline     Awareness: Intellectual awareness intact, Online awareness impaired Memory impairment (select all impairments): Short-term memory, Working memory Attention impairment (select first level of impairment): Selective attention (Easily internally distracted) Executive functioning impairment (select all impairments): Organization, Reasoning, Problem solving OT - Cognition Comments: Pt AAOx4 and agreeable throughout session. Pt with  diagnosis of dementia at baseline. Suspect pt is at or near baseline cognition, but  no family/caregiver avaialble at this time to confirm.                 Following commands: Impaired Following commands impaired: Only follows one step commands consistently, Follows multi-step commands inconsistently, Follows multi-step commands with increased time     Cueing  General Comments   Cueing Techniques: Verbal cues;Visual cues  Pt O2 sat >/ 93% on 3L continuous O2 through nasal cannula and HR in the 70s to 80s. However, pt with feeling of increased SOB and increased labor of breathing with activities in standing/stepping at EOB also leading to signs of anxiety with RN notified. Further mobility deferred this session.   Exercises     Shoulder Instructions      Home Living Family/patient expects to be discharged to:: Private residence Living Arrangements: Spouse/significant other Available Help at Discharge: Family Type of Home: House Home Access: Stairs to enter [employer] of Steps: 7 Entrance Stairs-Rails: Right Home Layout: Two level;Able to live on main level with bedroom/bathroom     Bathroom Shower/Tub: Tub/shower unit   Bathroom Toilet: Handicapped height Bathroom Accessibility: Yes How Accessible: Accessible via walker;Accessible via wheelchair Home Equipment: Rolling Walker (2 wheels);Shower seat;Grab bars - tub/shower;BSC/3in1;Wheelchair - [employer] (4 wheels)          Prior Functioning/Environment Prior Level of Function : Needs assist;History of Falls (last six months)             Mobility Comments: Modified I with rollator. Several recent falls with pt reporting at least 3 but unable to provide further details. ADLs Comments: Mod I to Set up for self-feeding, grooming, UB dressing, and toileting. Assist for showers and LB dressing. Wife assists with all IADLs. Pt typically wears incontinence briefs. Retired Midwife.     OT Problem List: Decreased activity tolerance;Impaired balance (sitting and/or standing);Decreased coordination;Decreased knowledge of use of DME or AE;Decreased knowledge of precautions;Cardiopulmonary status limiting activity   OT Treatment/Interventions: Self-care/ADL training;Therapeutic exercise;DME and/or AE instruction;Energy conservation;Therapeutic activities;Cognitive remediation/compensation;Patient/family education;Balance training      OT Goals(Current goals can be found in the care plan section)   Acute Rehab OT Goals Patient Stated Goal: to return home with wife OT Goal Formulation: With patient Time For Goal Achievement: 10/13/24 Potential to Achieve Goals: Good ADL Goals Pt Will Perform Grooming: with supervision;standing Pt Will Perform Upper Body Bathing: with supervision;sitting Pt Will Perform Lower Body Bathing: with min assist;sitting/lateral leans;sit to/from stand Pt Will Perform Upper Body Dressing: with set-up;with supervision;sitting Pt Will Transfer to Toilet: with supervision;ambulating;bedside commode (with least restrictive AD) Pt Will Perform Toileting - Clothing Manipulation and hygiene: with supervision;with contact guard assist;sitting/lateral leans;sit to/from stand   OT Frequency:  Min 2X/week    Co-evaluation              AM-PAC OT 6 Clicks Daily Activity     Outcome Measure Help from another person eating meals?: A Little Help from another person taking care of personal grooming?: A Little Help from another person toileting, which includes using toliet, bedpan, or urinal?: A Lot Help from another person bathing (including washing, rinsing, drying)?: A Lot Help from another person to put on and taking off regular upper body clothing?: A Little Help from another person to put on and taking off regular lower body clothing?: A Lot 6 Click Score: 15   End of Session Equipment Utilized During Treatment: Gait belt;Rolling walker (2  wheels);Oxygen Nurse Communication: Mobility status;Other (comment) (Pt with noted increased labor of breathing, signs  of anxiety, and reporting feeling of increased SOB with activities in standing/stepping at EOB with O2 sat >/ 93% on 3 L continuous O2 through [place] throughout session)  Activity Tolerance: Treatment limited secondary to medical complications (Comment) (Pt limited by feeling of SOB and increased labor of breathing with activities in standing/stepping at EOB with O2 sat stable on 3 L continuous O2) Patient left: in bed;with call bell/phone within reach;with bed alarm set;Other (comment) (with fall mat placed on floor next to bed)  OT Visit Diagnosis: Unsteadiness on feet (R26.81);Other abnormalities of gait and mobility (R26.89);Repeated falls (R29.6);History of falling (Z91.81)                Time: 8683-8665 OT Time Calculation (min): 18 min Charges:  OT General Charges $OT Visit: 1 Visit OT Evaluation $OT Eval Moderate Complexity: 1 Mod  Margarie Rockey HERO., OTR/L, MA Acute Rehab [phone]   Margarie FORBES Horns 09/29/2024, 2:14 PM

## 2024-09-29 NOTE — Inpatient Diabetes Management (Signed)
 Inpatient Diabetes Program Recommendations  AACE/ADA: New Consensus Statement on Inpatient Glycemic Control (2015)  Target Ranges:  Prepandial:   less than 140 mg/dL      Peak postprandial:   less than 180 mg/dL (1-2 hours)      Critically ill patients:  140 - 180 mg/dL   Lab Results  Component Value Date   GLUCAP 326 (H) 09/29/2024   HGBA1C 8.8 (H) 06/29/2024    Review of Glycemic Control  Latest Reference Range & Units 09/28/24 12:49 09/28/24 16:16 09/28/24 20:31 09/29/24 07:48  Glucose-Capillary 70 - 99 mg/dL 736 (H) 670 (H) 809 (H) 326 (H)  (H): Data is abnormally high Diabetes history: Type 2 DM Outpatient Diabetes medications: Lantus  40 units every day, Novolog  10 units TID Current orders for Inpatient glycemic control: Novolog  0-15 units TID & HS, Novolog  3 units TID, Lantus  15 units BID Prednisone  40 mg QD  Inpatient Diabetes Program Recommendations:    Consider increasing Novolog  6 units TID (Assuming patient is consuming >50% of meals).  Thanks, Tinnie Minus, MSN, RNC-OB Diabetes Coordinator [phone] (8a-5p)

## 2024-09-29 NOTE — Plan of Care (Signed)

## 2024-09-29 NOTE — Progress Notes (Signed)
 NIF/ VC not done in the AM. Pt was not in the room. RT will attempt at a later time.

## 2024-09-29 NOTE — Progress Notes (Signed)
 PHARMACY NOTE:  ANTIMICROBIAL RENAL DOSAGE ADJUSTMENT  Current antimicrobial regimen includes a mismatch between antimicrobial dosage and estimated renal function.  As per policy approved by the Pharmacy & Therapeutics and Medical Executive Committees, the antimicrobial dosage will be adjusted accordingly.  Current antimicrobial dosage:  Unasyn  3g IV Q8H  Indication: aspiration PNA  Renal Function:  Estimated Creatinine Clearance: 21.7 mL/min (A) (by C-G formula based on SCr of 2.73 mg/dL (H)). []      On intermittent HD, scheduled: []      On CRRT    Antimicrobial dosage has been changed to:  Unasyn  3g IV Q12H  Additional comments:   Thank you for allowing pharmacy to be a part of this patient's care.  Marvetta Dauphin, PharmD, BCPS  09/29/2024 6:22 AM

## 2024-09-30 DIAGNOSIS — I10 Essential (primary) hypertension: Secondary | ICD-10-CM | POA: Diagnosis not present

## 2024-09-30 DIAGNOSIS — J189 Pneumonia, unspecified organism: Secondary | ICD-10-CM | POA: Diagnosis not present

## 2024-09-30 DIAGNOSIS — G7000 Myasthenia gravis without (acute) exacerbation: Secondary | ICD-10-CM | POA: Diagnosis not present

## 2024-09-30 DIAGNOSIS — I480 Paroxysmal atrial fibrillation: Secondary | ICD-10-CM | POA: Diagnosis not present

## 2024-09-30 LAB — CBC
HCT: 29.5 % — ABNORMAL LOW (ref 39.0–52.0)
Hemoglobin: 9.7 g/dL — ABNORMAL LOW (ref 13.0–17.0)
MCH: 27.4 pg (ref 26.0–34.0)
MCHC: 32.9 g/dL (ref 30.0–36.0)
MCV: 83.3 fL (ref 80.0–100.0)
Platelets: 241 K/uL (ref 150–400)
RBC: 3.54 MIL/uL — ABNORMAL LOW (ref 4.22–5.81)
RDW: 16.5 % — ABNORMAL HIGH (ref 11.5–15.5)
WBC: 14 K/uL — ABNORMAL HIGH (ref 4.0–10.5)
nRBC: 0 % (ref 0.0–0.2)

## 2024-09-30 LAB — BASIC METABOLIC PANEL WITH GFR
Anion gap: 12 (ref 5–15)
BUN: 67 mg/dL — ABNORMAL HIGH (ref 8–23)
CO2: 20 mmol/L — ABNORMAL LOW (ref 22–32)
Calcium: 7.8 mg/dL — ABNORMAL LOW (ref 8.9–10.3)
Chloride: 104 mmol/L (ref 98–111)
Creatinine, Ser: 2.93 mg/dL — ABNORMAL HIGH (ref 0.61–1.24)
GFR, Estimated: 21 mL/min — ABNORMAL LOW
Glucose, Bld: 334 mg/dL — ABNORMAL HIGH (ref 70–99)
Potassium: 4.1 mmol/L (ref 3.5–5.1)
Sodium: 135 mmol/L (ref 135–145)

## 2024-09-30 LAB — GLUCOSE, CAPILLARY
Glucose-Capillary: 321 mg/dL — ABNORMAL HIGH (ref 70–99)
Glucose-Capillary: 339 mg/dL — ABNORMAL HIGH (ref 70–99)
Glucose-Capillary: 236 mg/dL — ABNORMAL HIGH (ref 70–99)
Glucose-Capillary: 240 mg/dL — ABNORMAL HIGH (ref 70–99)

## 2024-09-30 MED ORDER — IPRATROPIUM BROMIDE 0.02 % IN SOLN
0.5000 mg | Freq: Four times a day (QID) | RESPIRATORY_TRACT | Status: DC
  Administered 2024-09-30 – 2024-10-02 (×8): 0.5 mg via RESPIRATORY_TRACT
  Filled 2024-09-30 (×10): qty 2.5

## 2024-09-30 MED ORDER — INSULIN GLARGINE 100 UNIT/ML ~~LOC~~ SOLN
30.0000 [IU] | Freq: Every day | SUBCUTANEOUS | Status: DC
  Administered 2024-09-30 – 2024-10-03 (×4): 30 [IU] via SUBCUTANEOUS
  Filled 2024-09-30 (×4): qty 0.3

## 2024-09-30 MED ORDER — SODIUM CHLORIDE 0.9 % IV SOLN: INTRAVENOUS | Status: DC

## 2024-09-30 MED ORDER — INSULIN ASPART 100 UNIT/ML IJ SOLN
4.0000 [IU] | Freq: Three times a day (TID) | INTRAMUSCULAR | Status: DC
  Administered 2024-09-30 – 2024-10-03 (×10): 4 [IU] via SUBCUTANEOUS
  Filled 2024-09-30: qty 4
  Filled 2024-09-30: qty 1
  Filled 2024-09-30 (×4): qty 4
  Filled 2024-09-30: qty 1
  Filled 2024-09-30: qty 4

## 2024-09-30 MED ORDER — MELATONIN 3 MG PO TABS
3.0000 mg | ORAL_TABLET | Freq: Once | ORAL | Status: AC
  Administered 2024-09-30: 3 mg via ORAL
  Filled 2024-09-30: qty 1

## 2024-09-30 NOTE — Progress Notes (Signed)
 Spontaneous Mechanics:  NIF:  -35 x 2 FVC: 1.0 L x 2 efforts

## 2024-09-30 NOTE — Progress Notes (Signed)
 RE: Fernando Hess Date of Birth: 07/23/2046 Date: 09/30/24  Please be advised that the above-named patient has a primary diagnosis of dementia which supersedes any psychiatric diagnosis. Patient will require a short-term nursing home stay - anticipated 30 days or less for rehabilitation and strengthening.  The plan is for return home.

## 2024-09-30 NOTE — Progress Notes (Signed)
 Physical Therapy Treatment Patient Details Name: Fernando Hess MRN: [id number] DOB: 13-Sep-1946 Today's Date: 09/30/2024   History of Present Illness Pt is 79 year old presented to [facility] on  09/28/23 for falls and weakness. Pt with acute respiratory failure with hypoxia and sepsis secondary to aspiration PNA. PMH - CVA, myasthenia gravis, DM II, CAD status post CABG, chronic HFpEF, CKD IV, depression, anxiety, dementia, and OSA on CPAP, afib    PT Comments  Pt limited by SOB throughout session despite SpO2 remaining >93% on 2L. Pt appears and sounds SOB at rest and with activity. Pt able to speak 4-5 words at a time between breaths. Pt requires minA for bed mobility, requesting HHA to pull trunk upright and position EOB. STS and BTC transfer completed with HHA, rollator available but pt declining use at this time. Pt declining further ambulation due to not having breakfast yet. Seated exercises completed to promote blood flow, ROM, and strength in B LE and UE. Incentive spirometer provided and reviewed at end of session to encourage chest expansion. Pt would benefit from continued PT services focused on bed mobility, balance, gait, and breathing techniques to promote tolerance and independence with functional mobility.    If plan is discharge home, recommend the following: A little help with walking and/or transfers;A little help with bathing/dressing/bathroom;Assistance with cooking/housework;Assist for transportation;Help with stairs or ramp for entrance;Supervision due to cognitive status;Direct supervision/assist for medications management;Direct supervision/assist for financial management   Can travel by private vehicle        Equipment Recommendations  None recommended by PT (Has appropriate equipment at home)    Recommendations for Other Services       Precautions / Restrictions Precautions Precautions: Fall;Other (comment) Recall of Precautions/Restrictions:  Impaired Precaution/Restrictions Comments: Watch SpO2 Restrictions Weight Bearing Restrictions Per Provider Order: No     Mobility  Bed Mobility Overal bed mobility: Needs Assistance Bed Mobility: Supine to Sit     Supine to sit: Min assist, Used rails     General bed mobility comments: Does well mobilizing B LE over EOB. Hand held assist to pull trunk upright and use as leverage to reposition EOB. SOB with transfer.    Transfers Overall transfer level: Needs assistance Equipment used: 1 person hand held assist Transfers: Sit to/from Stand, Bed to chair/wheelchair/BSC Sit to Stand: Min assist   Step pivot transfers: Min assist       General transfer comment: Pt requesting HHA vs rollator when completing STS and stepping over to chair. MinA for balance during transfers. Declining further ambulation at this time due to not having breakfast yet.    Ambulation/Gait                   Stairs             Wheelchair Mobility     Tilt Bed    Modified Rankin (Stroke Patients Only)       Balance Overall balance assessment: Needs assistance Sitting-balance support: Feet supported, Single extremity supported Sitting balance-Leahy Scale: Fair Sitting balance - Comments: Hand held assist to establish seated position. Good trunk control with light UE support. No lean noted.   Standing balance support: Bilateral upper extremity supported, During functional activity Standing balance-Leahy Scale: Poor Standing balance comment: Hand held support and reaches for chair for balance. Declines using rollator for balance during transfer. MinA for balance in standing.  Communication Communication Communication: Impaired Factors Affecting Communication: Difficulty expressing self (Due to SOB? Speaks very quickly, has to stop between breaths)  Cognition Arousal: Alert Behavior During Therapy: WFL for tasks assessed/performed   PT -  Cognitive impairments: History of cognitive impairments, Problem solving, Memory                       PT - Cognition Comments: Slow processing Following commands: Impaired Following commands impaired: Follows multi-step commands with increased time    Cueing Cueing Techniques: Verbal cues, Tactile cues, Visual cues  Exercises General Exercises - Lower Extremity Ankle Circles/Pumps: AROM, Both, 10 reps, Seated Long Arc Quad: AROM, Both, 10 reps, Seated Hip ABduction/ADduction: AROM, Both, 10 reps, Seated Hip Flexion/Marching: AROM, Both, 10 reps, Seated Other Exercises Other Exercises: Cross body punches 1x10 Other Exercises: Shoulder shrugs 1x10 Other Exercises: UE jumping jacks 1x10    General Comments General comments (skin integrity, edema, etc.): VSS throughout, SpO2 >93% on 2L despite SOB. No new skin abnormalities noted.      Pertinent Vitals/Pain Pain Assessment Pain Assessment: No/denies pain    Home Living                          Prior Function            PT Goals (current goals can now be found in the care plan section) Acute Rehab PT Goals Patient Stated Goal: return home PT Goal Formulation: With patient Time For Goal Achievement: 10/12/24 Potential to Achieve Goals: Good Progress towards PT goals: Progressing toward goals    Frequency    Min 2X/week      PT Plan      Co-evaluation              AM-PAC PT 6 Clicks Mobility   Outcome Measure  Help needed turning from your back to your side while in a flat bed without using bedrails?: A Little Help needed moving from lying on your back to sitting on the side of a flat bed without using bedrails?: A Little Help needed moving to and from a bed to a chair (including a wheelchair)?: A Little Help needed standing up from a chair using your arms (e.g., wheelchair or bedside chair)?: A Little Help needed to walk in hospital room?: Total Help needed climbing 3-5 steps with  a railing? : Total 6 Click Score: 14    End of Session Equipment Utilized During Treatment: Oxygen Activity Tolerance: Patient limited by fatigue (Pt limited by SOB) Patient left: in chair;with call bell/phone within reach;with chair alarm set;with nursing/sitter in room Nurse Communication: Mobility status PT Visit Diagnosis: Muscle weakness (generalized) (M62.81);Unsteadiness on feet (R26.81)     Time: 9079-9056 PT Time Calculation (min) (ACUTE ONLY): 23 min  Charges:    $Therapeutic Exercise: 8-22 mins $Therapeutic Activity: 8-22 mins PT General Charges $$ ACUTE PT VISIT: 1 Visit                     Sabra Morel, PT, DPT  Acute Rehabilitation Services         Office: [phone]      Sabra MARLA Morel 09/30/2024, 12:39 PM

## 2024-09-30 NOTE — TOC Initial Note (Addendum)
 Transition of Care [facility]) - Initial/Assessment Note    Patient Details  Name: Fernando Hess MRN: [id number] Date of Birth: 10/24/1945  Transition of Care [facility]) CM/SW Contact:    Inocente GORMAN Kindle, LCSW Phone Number: 09/30/2024, 2:35 PM  Clinical Narrative:                 CSW received request to speak with patient's wife about SNF. Patient's spouse stated she would like patient to get stronger before coming home as he keeps falling. She would like him to go to [facility]. CSW will send out referral and will need to see if insurance would cover rehab with it stating home health. Updated PT.   Pasrr is pending upload of clinicals once cosigned.     Expected Discharge Plan: Skilled Nursing Facility Barriers to Discharge: English As A Second Language Teacher, Continued Medical Work up, SNF Pending bed offer   Patient Goals and CMS Choice Patient states their goals for this hospitalization and ongoing recovery are:: Rehab CMS Medicare.gov Compare Post Acute Care list provided to:: Patient Represenative (must comment) Choice offered to / list presented to : Spouse Coffee ownership interest in [facility].provided to:: Spouse    Expected Discharge Plan and Services In-house Referral: Clinical Social Work Discharge Planning Services: CM Consult Post Acute Care Choice: Skilled Nursing Facility Living arrangements for the past 2 months: Single Family Home                           HH Arranged: PT, Speech Therapy HH Agency: [facility] Date HH Agency Contacted: 09/29/24 Time HH Agency Contacted: 1013 Representative spoke with at [facility] Agency: sent via hub  Prior Living Arrangements/Services Living arrangements for the past 2 months: Single Family Home Lives with:: Spouse Patient language and need for interpreter reviewed:: Yes Do you feel safe going back to the place where you live?: Yes      Need for Family Participation in Patient Care: Yes (Comment) Care giver support  system in place?: Yes (comment) Current home services: DME (walker, wheelchair) Criminal Activity/Legal Involvement Pertinent to Current Situation/Hospitalization: No - Comment as needed  Activities of Daily Living      Permission Sought/Granted Permission sought to share information with : Facility Medical Sales Representative, Family Supports Permission granted to share information with : Yes, Verbal Permission Granted  Share Information with NAME: Nyquan, Selbe [phone]  Permission granted to share info w AGENCY: SNFs        Emotional Assessment Appearance:: Appears stated age Attitude/Demeanor/Rapport: Engaged Affect (typically observed): Accepting Orientation: : Oriented to Self, Oriented to Place, Oriented to  Time, Oriented to Situation Alcohol  / Substance Use: Not Applicable Psych Involvement: No (comment)  Admission diagnosis:  Hypocalcemia [E83.51] Aspiration pneumonia ([name]) [J69.0] Elevated troponin [R79.89] Sepsis due to pneumonia ([name]) [J18.9, A41.9] Acute on chronic heart failure, unspecified heart failure type [facility]) [I50.9] Patient Active Problem List   Diagnosis Date Noted   Sepsis due to pneumonia ([name]) 09/29/2024   DNR (do not resuscitate) 09/28/2024   DNI (do not intubate) 09/28/2024   Aspiration pneumonia ([name]) 09/27/2024   Acute CVA (cerebrovascular accident) ([name]) 06/29/2024   History of anxiety 06/28/2024   History of CAD (coronary artery disease) 06/28/2024   History of nocturnal hypoxia ([name]) 06/28/2024   Nocturnal hypoxia 03/03/2024   Shortness of breath 03/02/2024   Acute on chronic diastolic CHF (congestive heart failure) ([name]) 02/10/2024   Obesity, class 1 02/10/2024   SOB (shortness of  breath) 09/15/2023   BPH (benign prostatic hyperplasia) 02/27/2023   History of anemia due to chronic kidney disease 02/27/2023   COVID-19 02/27/2023   COVID-19 virus infection 02/26/2023   Acute kidney injury superimposed on chronic kidney disease  02/03/2023   Transient hypotension 02/03/2023   SIRS (systemic inflammatory response syndrome) ([name]) 02/03/2023   Dementia without behavioral disturbance ([name]) 02/03/2023   Fall at home, initial encounter 02/03/2023   Acute hypoxic respiratory failure ([name]) 02/03/2023   Hypokalemia 02/03/2023   Prolonged QT interval 02/03/2023   Left ankle pain 01/08/2023   Hematuria 01/08/2023   Fracture of femoral neck, right, closed ([name]) 12/24/2022   Depression with anxiety 12/24/2022   Acute metabolic encephalopathy 09/18/2022   Acute encephalopathy 09/17/2022   AKI (acute kidney injury) 09/04/2022   Generalized weakness 08/12/2022   History of seronegative myasthenia gravis ([name]) 04/21/2022   CKD stage 3b, GFR 30-44 ml/min ([name]) 03/21/2022   Posterior vitreous detachment of both eyes 01/23/2022   Constipation 11/13/2021   Insulin  dependent type 2 diabetes mellitus ([name]) 11/13/2021   Severe major depression, single episode, without psychotic features ([name]) 11/13/2021   Amaurosis fugax 11/13/2021   Amnesia 11/13/2021   Anxiety 11/13/2021   Cardiac arrhythmia 11/13/2021   Hearing loss 11/13/2021   Hypercoagulable state 11/13/2021   Hyperparathyroidism due to renal insufficiency 11/13/2021   Moderate aortic stenosis 11/05/2021   Paroxysmal atrial fibrillation ([name]) 11/05/2021   Nasal septal deviation 08/30/2021   Anticoagulated by anticoagulation treatment 07/16/2021   Syncope 05/10/2021   Gastro-esophageal reflux disease without esophagitis 07/25/2020   Globus pharyngeus 07/25/2020   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) 12/21/2019   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   (HFpEF) heart failure with preserved ejection fraction ([name]) 11/18/2019   Obstructive sleep apnea 03/07/2019   Peripheral artery disease    CKD (chronic kidney disease), stage IV ([name]) 11/29/2018   Intractable vascular headache 11/11/2018   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    Coronary artery disease involving native coronary artery of native heart without angina pectoris 09/24/2015   Moderate nonproliferative diabetic retinopathy of left eye with macular edema associated with type 2 diabetes mellitus ([name]) 03/01/2008   Hyperlipidemia    Essential hypertension    History of kidney stones    PCP:  Okey Carlin Redbird, MD Pharmacy:   [phone] [place], [place] - [address] AT [facility] [address] [name] [address] Phone: [phone] Fax: [phone]  [facility] [address] [name] [address] Phone: [phone] Fax: [phone]  [id number] [place], [name] - [address] AT [facility] [address] [name] [address] Phone: [phone] Fax: [phone]     Social Drivers of Health (SDOH) Social History: SDOH Screenings   Food Insecurity: No Food Insecurity (09/28/2024)  Housing: Low Risk (09/28/2024)  Transportation Needs: No Transportation Needs (09/28/2024)  Utilities: Not At Risk (09/28/2024)  Social Connections: Moderately Isolated (09/28/2024)  Tobacco Use: Low Risk (08/03/2024)   SDOH Interventions:     Readmission Risk Interventions    09/30/2024    2:31 PM 06/30/2024   10:52 AM 02/10/2024    3:26 PM  Readmission Risk Prevention Plan  Transportation Screening Complete Complete Complete  PCP or Specialist Appt within 3-5 Days   Complete  HRI or Home Care Consult   Complete  Palliative Care Screening   Not Applicable  Medication Review (RN  Care Manager) Complete Complete Complete  PCP or Specialist appointment within 3-5 days of discharge Complete    HRI or Home Care Consult Complete Complete   SW Recovery Care/Counseling Consult Complete    Palliative Care Screening Not Applicable Not Applicable   Skilled Nursing Facility Complete Not Applicable

## 2024-09-30 NOTE — Care Management Important Message (Signed)
 Important Message  Patient Details  Name: Fernando Hess MRN: [id number] Date of Birth: 13-Aug-1946   Important Message Given:  Yes - Medicare IM     Claretta Deed 09/30/2024, 2:48 PM

## 2024-09-30 NOTE — Progress Notes (Signed)
 Speech Language Pathology Treatment: Dysphagia  Patient Details Name: Fernando Hess MRN: [id number] DOB: 03/03/1946 Today's Date: 09/30/2024 Time: 8955-8941 SLP Time Calculation (min) (ACUTE ONLY): 14 min  Assessment / Plan / Recommendation Clinical Impression  Pt continues to exhibit functional oropharyngeal swallow, despite known physiological deficits. SLP arranged food items to simulate a meal. Pt required multiple cues and increased wait time for pt to begin eating. One instance of delayed throat clearing after consuming applesauce and a graham cracker. Pt stated alternating bites of solid and sips of lids relieved any feelings of residue. Pt appeared to benefit from taking small bites and sips and slow pace while eating. Pt stated multiple times during session that he was short of breath, even after SLP adjusted nasal cannula.   PLAN: Will leave on current diet. Pt would benefit from intermittent supervision during meals to ensure swallow strategies are employed. SLP to f/u briefly given mentation and respiratory status.     HPI HPI: Fernando Hess is an 79 y.o. male who presented from home to the hospital on 09/28/23 for falls and weakness. He was admitted with acute respiratory failure with hypoxia secondary to aspiration PNA. CT chest showed Airspace opacity is noted posteriorly in right upper lobe concerning for pneumonia. Smaller opacity is noted in left upper lobe as well. MBS on 1/15; no s/s of aspiration with mild oropharyngeal deficits noted, however swallow appeared functional. PMH: dementia, CVA, myasthenia gravis, DM, CAD s/p CABG, chronic HfpEF, CKD, depression, anxiety, OSA on CPAP, a-fib.      SLP Plan           Swallow Evaluation Recommendations   Recommendations: PO diet PO Diet Recommendation: Regular;Thin liquids (Level 0) Liquid Administration via: Cup;Straw Medication Administration: Whole meds with liquid Supervision: Intermittent supervision/cueing for  swallowing strategies Postural changes: Position pt fully upright for meals;Stay upright 30-60 min after meals Oral care recommendations: Oral care BID (2x/day)     Recommendations                     Oral care BID   Intermittent Supervision/Assistance Dysphagia, oropharyngeal phase (R13.12)           Rocky Quan, Student SLP   09/30/2024, 12:34 PM

## 2024-09-30 NOTE — Progress Notes (Addendum)
 "                        PROGRESS NOTE        PATIENT DETAILS Name: Fernando Hess Age: 79 y.o. Sex: male Date of Birth: 05/25/1946 Admit Date: 09/27/2024 Admitting Physician Lavada MARLA Stank, MD ERE:Mndd, Carlin Redbird, MD  Brief Summary: 79 year old with history of myasthenia gravis (currently not on any treatment-per prior outpatient note-failed Mestinon/IVIG/prednisone )-HFpEF, A-fib, DM-2, HTN-who presented with shortness of breath after a choking episode/vomiting-found to have acute hypoxic respiratory failure secondary to aspiration pneumonia.   Significant events 1/13>> admit 1/14>> PCCM consult for worsening hypoxemia-placed on BiPAP-IV steroids-made DNR. 1/15>> liberated off BiPAP last night.  Stable on either room air or 2 L of oxygen.  Significant studies: 1/13>> CT head: No acute intracranial abnormality 1/13>> CT chest/abdomen/pelvis: Airspace opacity right upper lobe.  Significant microbiology data: 1/13>> COVID/influenza/RSV PCR: Negative 1/13>> respiratory virus panel: Negative 1/13>> blood culture: No growth  Procedures: None  Consults: PCCM  Subjective: Lying comfortably in bed-denies any chest pain or shortness of breath.  Denies any acute complaints to me this morning.  Objective: Vitals: Blood pressure 123/63, pulse 61, temperature 98 F (36.7 C), temperature source Oral, resp. rate (!) 23, height 5' 6 (1.676 m), weight 82 kg, SpO2 95%.   Exam: Gen Exam:Alert awake-not in any distress HEENT:atraumatic, normocephalic Chest: B/L clear to auscultation anteriorly CVS:S1S2 regular Abdomen:soft non tender, non distended Extremities:no edema Neurology: Minimally confused but redirectable-nonfocal exam. Skin: no rash  Pertinent Labs/Radiology:    Latest Ref Rng & Units 09/30/2024    3:57 AM 09/28/2024    5:11 AM 09/27/2024   11:00 AM  CBC  WBC 4.0 - 10.5 K/uL 14.0  14.4    Hemoglobin 13.0 - 17.0 g/dL 9.7  9.6  89.4   Hematocrit 39.0 - 52.0 % 29.5   30.3  31.0   Platelets 150 - 400 K/uL 241  178      Lab Results  Component Value Date   NA 135 09/30/2024   K 4.1 09/30/2024   CL 104 09/30/2024   CO2 20 (L) 09/30/2024      Assessment/Plan: Acute hypoxic respiratory failure secondary to aspiration pneumonia Required BiPAP on 1/15-currently stable on either room air or minimal amount of oxygen Lungs are clear to exam Continue Unasyn  No longer on Solu-Medrol -on prednisone  for a few more days-lungs are relatively clear on exam Continue incentive spirometry/flutter valve. Attempt to titrate off oxygen   AKI on CKD 4 Slight bump in creatinine today Continue to monitor with frequent bladder scans-if need be-we can place Foley catheter for significant retention Awaiting renal ultrasound Hold off on resuming diuretics for now-has been status is reasonably stable. Avoid nephrotoxic agents and follow clinical course/renal function.   History of seronegative myasthenia gravis Reviewed outpatient neurology note-March 2025-apparently has failed IVIG/Mestinon/prednisone -currently not on any medications. He seems to be stable currently-his hypoxia/respiratory issues are from his aspiration pneumonia.  His NIF/FVC is currently stable-ABG a few days ago did not show any hypercarbia.  He did well with his modified barium swallow and is tolerating swallowing/diet pretty well. Continue to watch closely-if needed-we can always consult neurology.    Chronic HFpEF Relatively euvolemic-some trace edema in his legs Hold diuretics for now given slight jump in creatinine-his respiratory function has overall improved   PAF Telemetry monitoring Cardizem /Eliquis    CAD Currently without any anginal symptoms Supportive care.   HTN BP relatively stable Cardizem /Imdur /hydralazine   Hyponatremia Mild Resolved with D5 infusion-stop D5W-encourage oral intake   DM-2 (A1c 8.8 on 10/15) with uncontrolled hyperglycemia secondary to steroids CBGs  remain on the higher side but he will complete his last dose of prednisone  today Will consolidate Lantus  to 30 units daily-continue SSI and see how he does.     Recent Labs    09/29/24 2119 09/30/24 0751 09/30/24 1140  GLUCAP 288* 321* 339*     Dementia Delirium precautions Aricept /Seroquel    OSA Per prior notes-uses 2 L of oxygen at night.   BPH Flomax  Frequent bladder scans  Code status:   Code Status: Limited: Do not attempt resuscitation (DNR) -DNR-LIMITED -Do Not Intubate/DNI    DVT Prophylaxis: apixaban  (ELIQUIS ) tablet 2.5 mg Start: 09/27/24 1500 apixaban  (ELIQUIS ) tablet 2.5 mg    Family Communication: Spouse-Jean Fujii-[phone] updated over the phone 1/16   Disposition Plan: Status is: Inpatient Remains inpatient appropriate because: Severity of illness   Planned Discharge Destination:Home health   Diet: Diet Order             Diet regular Room service appropriate? No; Fluid consistency: Thin  Diet effective now                     Antimicrobial agents: Anti-infectives (From admission, onward)    Start     Dose/Rate Route Frequency Ordered Stop   09/29/24 1000  Ampicillin -Sulbactam (UNASYN ) 3 g in sodium chloride  0.9 % 100 mL IVPB        3 g 200 mL/hr over 30 Minutes Intravenous Every 12 hours 09/29/24 0622     09/28/24 1400  Ampicillin -Sulbactam (UNASYN ) 3 g in sodium chloride  0.9 % 100 mL IVPB  Status:  Discontinued        3 g 200 mL/hr over 30 Minutes Intravenous Every 8 hours 09/28/24 1207 09/29/24 0622   09/27/24 1600  Ampicillin -Sulbactam (UNASYN ) 3 g in sodium chloride  0.9 % 100 mL IVPB  Status:  Discontinued        3 g 200 mL/hr over 30 Minutes Intravenous Every 12 hours 09/27/24 1509 09/28/24 1207   09/27/24 1330  cefTRIAXone  (ROCEPHIN ) 2 g in sodium chloride  0.9 % 100 mL IVPB        2 g 200 mL/hr over 30 Minutes Intravenous  Once 09/27/24 1323 09/27/24 1411        MEDICATIONS: Scheduled Meds:  apixaban   2.5 mg Oral  BID   [START ON 10/01/2024] cyanocobalamin   1,000 mcg Intramuscular Weekly   diltiazem   90 mg Oral Q8H   insulin  aspart  0-15 Units Subcutaneous TID WC   insulin  aspart  0-5 Units Subcutaneous QHS   insulin  aspart  4 Units Subcutaneous TID WC   insulin  glargine  30 Units Subcutaneous Daily   ipratropium  0.5 mg Nebulization Q6H WA   isosorbide  mononitrate  30 mg Oral Daily   mirabegron  ER  50 mg Oral QHS   pantoprazole   40 mg Oral Daily   QUEtiapine   25 mg Oral QHS   tamsulosin   0.4 mg Oral Daily   Continuous Infusions:  ampicillin -sulbactam (UNASYN ) IV 3 g (09/30/24 0944)   PRN Meds:.acetaminophen  **OR** acetaminophen , albuterol , bisacodyl , diltiazem , hydrALAZINE , nitroGLYCERIN    I have personally reviewed following labs and imaging studies  LABORATORY DATA: CBC: Recent Labs  Lab 09/27/24 1035 09/27/24 1100 09/28/24 0511 09/30/24 0357  WBC 18.1*  --  14.4* 14.0*  NEUTROABS 15.4*  --  11.0*  --   HGB 10.4* 10.5* 9.6* 9.7*  HCT  32.8* 31.0* 30.3* 29.5*  MCV 86.3  --  85.4 83.3  PLT 200  --  178 241    Basic Metabolic Panel: Recent Labs  Lab 09/27/24 1035 09/27/24 1100 09/28/24 0511 09/29/24 0212 09/30/24 0357  NA 139 140 146* 135 135  K 4.2 4.2 3.0* 4.7 4.1  CL 107 107 118* 102 104  CO2 21*  --  19* 20* 20*  GLUCOSE 339* 336* 147* 302* 334*  BUN 34* 40* 33* 48* 67*  CREATININE 2.16* 2.20* 1.86* 2.73* 2.93*  CALCIUM  7.5*  --  5.9* 8.1* 7.8*  MG  --   --  1.6*  --   --     GFR: Estimated Creatinine Clearance: 20.2 mL/min (A) (by C-G formula based on SCr of 2.93 mg/dL (H)).  Liver Function Tests: Recent Labs  Lab 09/27/24 1035 09/28/24 0511  AST 17 16  ALT 8 7  ALKPHOS 105 73  BILITOT 1.1 0.6  PROT 5.7* 4.3*  ALBUMIN  3.1* 2.4*   Recent Labs  Lab 09/27/24 1035  LIPASE 11   No results for input(s): AMMONIA in the last 168 hours.  Coagulation Profile: Recent Labs  Lab 09/27/24 1035  INR 2.2*    Cardiac Enzymes: No results for input(s):  CKTOTAL, CKMB, CKMBINDEX, TROPONINI in the last 168 hours.  BNP (last 3 results) Recent Labs    09/27/24 1035  PROBNP 5,797.0*    Lipid Profile: No results for input(s): CHOL, HDL, LDLCALC, TRIG, CHOLHDL, LDLDIRECT in the last 72 hours.  Thyroid  Function Tests: Recent Labs    09/28/24 0511  FREET4 1.27    Anemia Panel: No results for input(s): VITAMINB12, FOLATE, FERRITIN, TIBC, IRON, RETICCTPCT in the last 72 hours.  Urine analysis:    Component Value Date/Time   COLORURINE YELLOW 09/27/2024 1544   APPEARANCEUR CLOUDY (A) 09/27/2024 1544   LABSPEC 1.012 09/27/2024 1544   PHURINE 5.0 09/27/2024 1544   GLUCOSEU >=500 (A) 09/27/2024 1544   HGBUR SMALL (A) 09/27/2024 1544   BILIRUBINUR NEGATIVE 09/27/2024 1544   KETONESUR NEGATIVE 09/27/2024 1544   PROTEINUR 100 (A) 09/27/2024 1544   UROBILINOGEN 0.2 10/02/2009 1604   NITRITE NEGATIVE 09/27/2024 1544   LEUKOCYTESUR LARGE (A) 09/27/2024 1544    Sepsis Labs: Lactic Acid, Venous    Component Value Date/Time   LATICACIDVEN 1.6 09/27/2024 1048    MICROBIOLOGY: Recent Results (from the past 240 hours)  Resp panel by RT-PCR (RSV, Flu A&B, Covid) Anterior Nasal Swab     Status: None   Collection Time: 09/27/24 12:15 PM   Specimen: Anterior Nasal Swab  Result Value Ref Range Status   SARS Coronavirus 2 by RT PCR NEGATIVE NEGATIVE Final   Influenza A by PCR NEGATIVE NEGATIVE Final   Influenza B by PCR NEGATIVE NEGATIVE Final    Comment: (NOTE) The Xpert Xpress SARS-CoV-2/FLU/RSV plus assay is intended as an aid in the diagnosis of influenza from Nasopharyngeal swab specimens and should not be used as a sole basis for treatment. Nasal washings and aspirates are unacceptable for Xpert Xpress SARS-CoV-2/FLU/RSV testing.  Fact Sheet for Patients: bloggercourse.com  Fact Sheet for Healthcare Providers: seriousbroker.it  This test is not  yet approved or cleared by the [place]  FDA and has been authorized for detection and/or diagnosis of SARS-CoV-2 by FDA under an Emergency Use Authorization (EUA). This EUA will remain in effect (meaning this test can be used) for the duration of the COVID-19 declaration under Section 564(b)(1) of the Act, 21 U.S.C. section 360bbb-3(b)(1), unless  the authorization is terminated or revoked.     Resp Syncytial Virus by PCR NEGATIVE NEGATIVE Final    Comment: (NOTE) Fact Sheet for Patients: bloggercourse.com  Fact Sheet for Healthcare Providers: seriousbroker.it  This test is not yet approved or cleared by the [place]  FDA and has been authorized for detection and/or diagnosis of SARS-CoV-2 by FDA under an Emergency Use Authorization (EUA). This EUA will remain in effect (meaning this test can be used) for the duration of the COVID-19 declaration under Section 564(b)(1) of the Act, 21 U.S.C. section 360bbb-3(b)(1), unless the authorization is terminated or revoked.  Performed at [facility], [address] [place], [name] [address]   Blood culture (routine x 2)     Status: None (Preliminary result)   Collection Time: 09/27/24  1:24 PM   Specimen: BLOOD  Result Value Ref Range Status   Specimen Description BLOOD RIGHT ANTECUBITAL  Final   Special Requests   Final    BOTTLES DRAWN AEROBIC AND ANAEROBIC Blood Culture adequate volume   Culture   Final    NO GROWTH 3 DAYS Performed at [facility], [address], [place], [name] [address]    Report Status PENDING  Incomplete  Blood culture (routine x 2)     Status: None (Preliminary result)   Collection Time: 09/27/24  1:29 PM   Specimen: BLOOD RIGHT HAND  Result Value Ref Range Status   Specimen Description BLOOD RIGHT HAND  Final   Special Requests   Final    BOTTLES DRAWN AEROBIC AND ANAEROBIC Blood Culture adequate volume   Culture   Final    NO  GROWTH 3 DAYS Performed at [facility], [address], [place], [name] [address]    Report Status PENDING  Incomplete  MRSA Next Gen by PCR, Nasal     Status: None   Collection Time: 09/27/24  2:58 PM   Specimen: Nasal Mucosa; Nasal Swab  Result Value Ref Range Status   MRSA by PCR Next Gen NOT DETECTED NOT DETECTED Final    Comment: (NOTE) The GeneXpert MRSA Assay (FDA approved for NASAL specimens only), is one component of a comprehensive MRSA colonization surveillance program. It is not intended to diagnose MRSA infection nor to guide or monitor treatment for MRSA infections. Test performance is not FDA approved in patients less than 65 years old. Performed at [facility], [address] [place], [name] [address]   Respiratory (~20 pathogens) panel by PCR     Status: None   Collection Time: 09/27/24  3:20 PM   Specimen: Nasopharyngeal Swab; Respiratory  Result Value Ref Range Status   Adenovirus NOT DETECTED NOT DETECTED Final   Coronavirus 229E NOT DETECTED NOT DETECTED Final    Comment: (NOTE) The Coronavirus on the Respiratory Panel, DOES NOT test for the novel  Coronavirus (2019 nCoV)    Coronavirus HKU1 NOT DETECTED NOT DETECTED Final   Coronavirus NL63 NOT DETECTED NOT DETECTED Final   Coronavirus OC43 NOT DETECTED NOT DETECTED Final   Metapneumovirus NOT DETECTED NOT DETECTED Final   Rhinovirus / Enterovirus NOT DETECTED NOT DETECTED Final   Influenza A NOT DETECTED NOT DETECTED Final   Influenza B NOT DETECTED NOT DETECTED Final   Parainfluenza Virus 1 NOT DETECTED NOT DETECTED Final   Parainfluenza Virus 2 NOT DETECTED NOT DETECTED Final   Parainfluenza Virus 3 NOT DETECTED NOT DETECTED Final   Parainfluenza Virus 4 NOT DETECTED NOT DETECTED Final  Respiratory Syncytial Virus NOT DETECTED NOT DETECTED Final   Bordetella pertussis NOT DETECTED NOT DETECTED Final   Bordetella Parapertussis NOT DETECTED NOT DETECTED Final   Chlamydophila  pneumoniae NOT DETECTED NOT DETECTED Final   Mycoplasma pneumoniae NOT DETECTED NOT DETECTED Final    Comment: Performed at [facility], [address] [place], [name] [address]    RADIOLOGY STUDIES/RESULTS: DG Swallowing Func-Speech Pathology Result Date: 09/29/2024 Table formatting from the original result was not included. Modified Barium Swallow Study Patient Details Name: Fernando Hess MRN: [id number] Date of Birth: [birth year] Today's Date: 09/29/2024 HPI/PMH: HPI: Blaise Grieshaber is an 79 y.o. male who presented from home to the hospital on 09/28/23 for falls and weakness. He was admitted with acute respiratory failure with hypoxia secondary to aspiration PNA. CT chest showed Airspace opacity is noted posteriorly in right upper lobe concerning for pneumonia. Smaller opacity is noted in left upper lobe as well. PMH: dementia, CVA, myasthenia gravis, DM, CAD s/p CABG, chronic HfpEF, CKD, depression, anxiety, OSA on CPAP, a-fib. Clinical Impression: Pt has oral and pharyngeal deficits but still exhibits fairly functional swallowing in terms of efficiency and safety. Recommend resuming regular diet and thin liquids with use of aspiration precautions and taking breaks PRN for respiratory status. SLP will f/u at least briefly given respiratory status as well as clinical signs noted during initial eval. Pt has good control when prompted to orally hold boluses, but when swallowing more spontaneously, he does have some extra repetitive movements before initiating swift posterior transit. During these extra movements there are moments of premature spillage. He also has reduced velopharyngeal closure, base of tongue retraction, and pharyngeal squeeze, but he clears his pharynx well. Vallecular residue is noted intermittently, but when there, he performs a second swallow that clears it. Even across challenging, and moments in which he sounded more short of breath, he had no aspiration or penetration. Note that  he also did not exhibit signs of dysphagia that were noted clinically (throat clear only x1, no wet vocal quality observed today). Factors that may increase risk of adverse event in presence of aspiration Noe & Lianne 2021): Factors that may increase risk of adverse event in presence of aspiration Noe & Lianne 2021): Respiratory or GI disease Recommendations/Plan: Swallowing Evaluation Recommendations Swallowing Evaluation Recommendations Recommendations: PO diet PO Diet Recommendation: Regular; Thin liquids (Level 0) Liquid Administration via: Cup; Straw Medication Administration: Whole meds with liquid Supervision: Staff to assist with self-feeding Swallowing strategies  : Slow rate; Small bites/sips (take breaks PRN for respiratory status) Postural changes: Position pt fully upright for meals Oral care recommendations: Oral care BID (2x/day) Treatment Plan Treatment Plan Treatment recommendations: Therapy as outlined in treatment plan below Follow-up recommendations: No SLP follow up Functional status assessment: Patient has had a recent decline in their functional status and demonstrates the ability to make significant improvements in function in a reasonable and predictable amount of time. Treatment frequency: Min 2x/week Treatment duration: 1 week Interventions: Aspiration precaution training; Compensatory techniques; Patient/family education; Diet toleration management by SLP Recommendations Recommendations for follow up therapy are one component of a multi-disciplinary discharge planning process, led by the attending physician.  Recommendations may be updated based on patient status, additional functional criteria and insurance authorization. Assessment: Orofacial Exam: Orofacial Exam Oral Cavity - Dentition: Adequate natural dentition Anatomy: No data recorded Boluses Administered: Boluses Administered Boluses Administered: Thin liquids (Level 0); Mildly thick liquids (Level 2, nectar thick);  Moderately thick liquids (Level 3, honey thick);  Puree; Solid  Oral Impairment Domain: Oral Impairment Domain Lip Closure: No labial escape Tongue control during bolus hold: Cohesive bolus between tongue to palatal seal Bolus preparation/mastication: Timely and efficient chewing and mashing Bolus transport/lingual motion: Brisk tongue motion Oral residue: Trace residue lining oral structures Location of oral residue : Floor of mouth; Tongue Initiation of pharyngeal swallow : Pyriform sinuses  Pharyngeal Impairment Domain: Pharyngeal Impairment Domain Soft palate elevation: Trace column of contrast or air between SP and PW Laryngeal elevation: Complete superior movement of thyroid  cartilage with complete approximation of arytenoids to epiglottic petiole Anterior hyoid excursion: Partial anterior movement Epiglottic movement: Complete inversion Laryngeal vestibule closure: Complete, no air/contrast in laryngeal vestibule Pharyngeal stripping wave : Present - diminished Pharyngeal contraction (A/P view only): N/A Pharyngoesophageal segment opening: Partial distention/partial duration, partial obstruction of flow Tongue base retraction: Narrow column of contrast or air between tongue base and PPW Pharyngeal residue: Collection of residue within or on pharyngeal structures Location of pharyngeal residue: Valleculae  Esophageal Impairment Domain: Esophageal Impairment Domain Esophageal clearance upright position: Complete clearance, esophageal coating Pill: Pill Consistency administered: Thin liquids (Level 0) Thin liquids (Level 0): Impaired (see clinical impressions) (pt not able to clear the barium tablet from his oral cavity, chewed it before swallowing but denies trouble swallowing any of his usual pills) Penetration/Aspiration Scale Score: Penetration/Aspiration Scale Score 1.  Material does not enter airway: Thin liquids (Level 0); Mildly thick liquids (Level 2, nectar thick); Moderately thick liquids (Level 3,  honey thick); Puree; Solid; Pill Compensatory Strategies: No data recorded  General Information: Caregiver present: No  Diet Prior to this Study: NPO   Temperature : Normal   Respiratory Status: WFL   Supplemental O2: Nasal cannula   History of Recent Intubation: No  Behavior/Cognition: Cooperative; Pleasant mood; Alert Self-Feeding Abilities: Able to self-feed Baseline vocal quality/speech: Normal No data recorded Volitional Swallow: Able to elicit Exam Limitations: No limitations Goal Planning: Prognosis for improved oropharyngeal function: Good No data recorded No data recorded Patient/Family Stated Goal: no family present, patient eager to return home Consulted and agree with results and recommendations: Patient; Physician Pain: Pain Assessment Pain Assessment: Faces Faces Pain Scale: 0 End of Session: Start Time:SLP Start Time (ACUTE ONLY): 0908 Stop Time: SLP Stop Time (ACUTE ONLY): 0929 Time Calculation:SLP Time Calculation (min) (ACUTE ONLY): 21 min Charges: SLP Evaluations $ SLP Speech Visit: 1 Visit SLP Evaluations $BSS Swallow: 1 Procedure $MBS Swallow: 1 Procedure SLP visit diagnosis: SLP Visit Diagnosis: Dysphagia, oropharyngeal phase (R13.12) Past Medical History: Past Medical History: Diagnosis Date  Acute renal failure (ARF) 09/24/2015  Acute stress disorder 11/13/2021  Atrial premature complexes   CAD (coronary artery disease) of artery bypass graft   Early occlusion of saphenous vein graft to intermediate and marginal branch in February 2007 following bypass grafting   CAD (coronary artery disease), native coronary artery 2017  hx NSTEMI 09-24-2015  s/p  CABG x5 on 10-02-2015;  post op STEMI inferolateral wall,  SVG OM1 and SVG OM2 occluded, distal OM occlusion the calpruit, treated medically // Myoview  7/21: no ischemia, EF 65, low risk  CHF (congestive heart failure) ([name])   CKD (chronic kidney disease), stage III ([name])   Contusion of right knee 03/16/2020  Dyspnea   Elevated troponin   Erectile  dysfunction   Esophageal reflux   History of atrial fibrillation   post op CABG 10-02-2015  History of kidney stones   History of non-ST elevation myocardial infarction (NSTEMI) 09/24/2015  s/p  CABG x5  History of ST elevation myocardial infarction (STEMI) 10/22/2015  inferior wall,  post op CABG 10-02-2015  Hyperlipidemia   Hypertension   Left ureteral stone   Mild atherosclerosis of both carotid arteries   Nephrolithiasis   per CT bilateral non-obstructive calculi  Nocturnal hypoxemia   OSA (obstructive sleep apnea)   Peripheral artery disease   LE Arterial US  01/2019: R PTA and ATA occluded; L ATA occluded  RBBB (right bundle branch block)   Renal atrophy, right   Sleep apnea   wears cpap   ST elevation myocardial infarction (STEMI) of inferior wall ([name]) 10/22/2015  Type 2 diabetes mellitus treated with insulin  ([name])   followed by pcp  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) 03/01/2008  Wears glasses  Past Surgical History: Past Surgical History: Procedure Laterality Date  APPENDECTOMY  1965  CARDIAC CATHETERIZATION N/A 09/26/2015  Procedure: Left Heart Cath and Coronary Angiography;  Surgeon: Debby DELENA Sor, MD;  Location: [facility] INVASIVE CV LAB;  Service: Cardiovascular;  Laterality: N/A;  CARDIAC CATHETERIZATION N/A 10/22/2015  Procedure: Left Heart Cath and Coronary Angiography;  Surgeon: Ozell Fell, MD;  Location: [facility] INVASIVE CV LAB;  Service: Cardiovascular;  Laterality: N/A;  CATARACT EXTRACTION W/ INTRAOCULAR LENS  IMPLANT, BILATERAL  2017  COLONOSCOPY    CORONARY ARTERY BYPASS GRAFT N/A 10/02/2015  Procedure: CORONARY ARTERY BYPASS GRAFTING (CABG) X5 LIMA-LAD; SVG-DIAG; SVG-OM; SVG-PD; SVG-RAMUS TRANSESOPHAGEAL ECHOCARDIOGRAM (TEE) ENDOSCOPIC GREATER SAPHENOUS VEIN  HARVEST BILAT LE;  Surgeon: Maude Fleeta Ochoa, MD;  Location: [facility] OR;  Service: Open Heart Surgery;  Laterality: N/A;  CYSTOSCOPY/URETEROSCOPY/HOLMIUM LASER/STENT  PLACEMENT Left 08/10/2018  Procedure: CYSTOSCOPY/URETEROSCOPY/HOLMIUM LASER/STENT PLACEMENT;  Surgeon: Nieves Cough, MD;  Location: [facility];  Service: Urology;  Laterality: Left;  CYSTOSCOPY/URETEROSCOPY/HOLMIUM LASER/STENT PLACEMENT Left 09/10/2018  Procedure: CYSTOSCOPY/URETEROSCOPY/HOLMIUM LASER/STENT EXCHANGE;  Surgeon: Nieves Cough, MD;  Location: [facility]S;  Service: Urology;  Laterality: Left;  HIP ARTHROPLASTY Right 12/26/2022  Procedure: RIGHT HEMI HIP ARTHROPLASTY;  Surgeon: Edna Toribio DELENA, MD;  Location: [facility] OR;  Service: Orthopedics;  Laterality: Right;  LEFT HEART CATHETERIZATION WITH CORONARY ANGIOGRAM N/A 04/13/2014  Procedure: LEFT HEART CATHETERIZATION WITH CORONARY ANGIOGRAM;  Surgeon: Elsie GORMAN Somerset, MD;  Location: [facility] CATH LAB;  Service: Cardiovascular;  Laterality: N/A;  LEG SURGERY Right age 73  closed reduction leg fracture  NASAL SEPTOPLASTY W/ TURBINOPLASTY Bilateral 08/30/2021  Procedure: NASAL SEPTOPLASTY WITH BILATERAL INFERIOR TURBINATE REDUCTION;  Surgeon: Mable Lenis, MD;  Location: [facility] OR;  Service: ENT;  Laterality: Bilateral;  POLYPECTOMY    RIGHT HEART CATH N/A 06/06/2021  Procedure: RIGHT HEART CATH;  Surgeon: Cherrie Toribio SAUNDERS, MD;  Location: [facility] INVASIVE CV LAB;  Service: Cardiovascular;  Laterality: N/A;  TEE WITHOUT CARDIOVERSION N/A 10/02/2015  Procedure: TRANSESOPHAGEAL ECHOCARDIOGRAM (TEE);  Surgeon: Maude Fleeta Ochoa, MD;  Location: [facility] OR;  Service: Open Heart Surgery;  Laterality: N/A;  URETEROSCOPY WITH HOLMIUM LASER LITHOTRIPSY Bilateral 2004;  2005  dr grapey  @[email or web address]   VASECTOMY   Leita SAILOR., M.A. CCC-SLP Acute Rehabilitation Services Office: [phone] Secure chat preferred 09/29/2024, 11:06 AM  DG Chest Port 1V same Day Result Date: 09/29/2024 EXAM: 1 VIEW(S) XRAY OF THE CHEST 09/29/2024 07:10:00 AM COMPARISON: 09/27/2024 CLINICAL HISTORY: SOB (shortness of breath) FINDINGS: LUNGS AND PLEURA: Low lung volumes. Increased diffuse  interstitial markings. No focal pulmonary opacity. No pleural effusion. No pneumothorax. HEART AND MEDIASTINUM: CABG markers noted. Median sternotomy wires noted. No acute abnormality of the cardiac and mediastinal silhouettes. BONES  AND SOFT TISSUES: No acute osseous abnormality. IMPRESSION: 1. Low lung volumes with increased diffuse interstitial markings. 2. Postsurgical changes of prior CABG/median sternotomy. Electronically signed by: Evalene Coho MD 09/29/2024 07:32 AM EST RP Workstation: [id number]     LOS: 3 days   Donalda Applebaum, MD  [facility]    To contact the attending provider between 7A-7P or the covering provider during after hours 7P-7A, please log into the web site [email or web address] and access using universal [place] password for that web site. If you do not have the password, please call the hospital operator.  09/30/2024, 1:21 PM    "

## 2024-09-30 NOTE — [place] FL2 (Signed)
 " [place]  MEDICAID FL2 LEVEL OF CARE FORM     IDENTIFICATION  Patient Name: Fernando Hess Birthdate: [birth year] Sex: male Admission Date (Current Location): 09/27/2024  [facility] and [place] Number:  Producer, Television/film/video and Address:  [facility], [address] [place], [name] [address]      Provider Number: [id number]  Attending Physician Name and Address:  Raenelle Donalda HERO, MD  Relative Name and Phone Number:  Spouse, Emergency Contact [phone]    Current Level of Care: Hospital Recommended Level of Care: Skilled Nursing Facility Prior Approval Number:    Date Approved/Denied:   PASRR Number: pending  Discharge Plan: SNF    Current Diagnoses: Patient Active Problem List   Diagnosis Date Noted   Sepsis due to pneumonia ([name]) 09/29/2024   DNR (do not resuscitate) 09/28/2024   DNI (do not intubate) 09/28/2024   Aspiration pneumonia ([name]) 09/27/2024   Acute CVA (cerebrovascular accident) ([name]) 06/29/2024   History of anxiety 06/28/2024   History of CAD (coronary artery disease) 06/28/2024   History of nocturnal hypoxia ([name]) 06/28/2024   Nocturnal hypoxia 03/03/2024   Shortness of breath 03/02/2024   Acute on chronic diastolic CHF (congestive heart failure) ([name]) 02/10/2024   Obesity, class 1 02/10/2024   SOB (shortness of breath) 09/15/2023   BPH (benign prostatic hyperplasia) 02/27/2023   History of anemia due to chronic kidney disease 02/27/2023   COVID-19 02/27/2023   COVID-19 virus infection 02/26/2023   Acute kidney injury superimposed on chronic kidney disease 02/03/2023   Transient hypotension 02/03/2023   SIRS (systemic inflammatory response syndrome) ([name]) 02/03/2023   Dementia without behavioral disturbance ([name]) 02/03/2023   Fall at home, initial encounter 02/03/2023   Acute hypoxic respiratory failure ([name]) 02/03/2023   Hypokalemia 02/03/2023   Prolonged QT interval 02/03/2023   Left ankle pain 01/08/2023    Hematuria 01/08/2023   Fracture of femoral neck, right, closed ([name]) 12/24/2022   Depression with anxiety 12/24/2022   Acute metabolic encephalopathy 09/18/2022   Acute encephalopathy 09/17/2022   AKI (acute kidney injury) 09/04/2022   Generalized weakness 08/12/2022   History of seronegative myasthenia gravis ([name]) 04/21/2022   CKD stage 3b, GFR 30-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/min ([name]) 03/21/2022   Posterior vitreous detachment of both eyes 01/23/2022   Constipation 11/13/2021   Insulin  dependent type 2 diabetes mellitus ([name]) 11/13/2021   Severe major depression, single episode, without psychotic features ([name]) 11/13/2021   Amaurosis fugax 11/13/2021   Amnesia 11/13/2021   Anxiety 11/13/2021   Cardiac arrhythmia 11/13/2021   Hearing loss 11/13/2021   Hypercoagulable state 11/13/2021   Hyperparathyroidism due to renal insufficiency 11/13/2021   Moderate aortic stenosis 11/05/2021   Paroxysmal atrial fibrillation ([name]) 11/05/2021   Nasal septal deviation 08/30/2021   Anticoagulated by anticoagulation treatment 07/16/2021   Syncope 05/10/2021   Gastro-esophageal reflux disease without esophagitis 07/25/2020   Globus pharyngeus 07/25/2020   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) 12/21/2019   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   (HFpEF) heart failure with preserved ejection fraction ([name]) 11/18/2019   Obstructive sleep apnea 03/07/2019   Peripheral artery disease    CKD (chronic kidney disease), stage IV ([name]) 11/29/2018   Intractable vascular headache 11/11/2018   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   Coronary artery disease involving native coronary artery of native heart without angina pectoris 09/24/2015   Moderate nonproliferative diabetic retinopathy of left eye with macular edema associated with type 2 diabetes mellitus ([name]) 03/01/2008  Hyperlipidemia    Essential hypertension    History of kidney stones     Orientation RESPIRATION BLADDER Height &  Weight     Self, Time, Situation, Place  O2 (Nasal canula 3L/min) Incontinent, External catheter Weight: 180 lb 12.4 oz (82 kg) Height:  5' 6 (167.6 cm)  BEHAVIORAL SYMPTOMS/MOOD NEUROLOGICAL BOWEL NUTRITION STATUS      Continent Diet  AMBULATORY STATUS COMMUNICATION OF NEEDS Skin   Limited Assist Verbally Normal                       Personal Care Assistance Level of Assistance  Bathing, Feeding, Dressing Bathing Assistance: Limited assistance Feeding assistance: Limited assistance Dressing Assistance: Limited assistance     Functional Limitations Info             SPECIAL CARE FACTORS FREQUENCY  PT (By licensed PT), OT (By licensed OT)     PT Frequency: 5x/week OT Frequency: 5x/week            Contractures Contractures Info: Not present    Additional Factors Info  Code Status, Allergies Code Status Info: DNR-Limited Allergies Info: Magnesium -containing Compounds;Donepezil ;Statins; Tirzepatide;Dulaglutide;Levofloxacin;Liraglutide;Lisinopril           Current Medications (09/30/2024):  This is the current hospital active medication list Current Facility-Administered Medications  Medication Dose Route Frequency Provider Last Rate Last Admin   0.9 %  sodium chloride  infusion   Intravenous Continuous Ghimire, Donalda HERO, MD       acetaminophen  (TYLENOL ) tablet 650 mg  650 mg Oral Q6H PRN Singh, Prashant K, MD       Or   acetaminophen  (TYLENOL ) suppository 650 mg  650 mg Rectal Q6H PRN Singh, Prashant K, MD       albuterol  (PROVENTIL ) (2.5 MG/3ML) 0.083% nebulizer solution 2.5 mg  2.5 mg Nebulization Q2H PRN Singh, Prashant K, MD   2.5 mg at 09/30/24 1143   Ampicillin -Sulbactam (UNASYN ) 3 g in sodium chloride  0.9 % 100 mL IVPB  3 g Intravenous Q12H Mattie Marvetta SQUIBB, RPH 200 mL/hr at 09/30/24 0944 3 g at 09/30/24 0944   apixaban  (ELIQUIS ) tablet 2.5 mg  2.5 mg Oral BID Singh, Prashant K, MD   2.5 mg at 09/30/24 0946   bisacodyl  (DULCOLAX) EC tablet 5 mg  5 mg Oral  Daily PRN Singh, Prashant K, MD       [START ON 10/01/2024] cyanocobalamin  (VITAMIN B12) injection 1,000 mcg  1,000 mcg Intramuscular Weekly Singh, Prashant K, MD       diltiazem  (CARDIZEM ) injection 10 mg  10 mg Intravenous Q6H PRN Singh, Prashant K, MD       diltiazem  (CARDIZEM ) tablet 90 mg  90 mg Oral Q8H Singh, Prashant K, MD   90 mg at 09/30/24 0504   hydrALAZINE  (APRESOLINE ) injection 10 mg  10 mg Intravenous Q6H PRN Singh, Prashant K, MD       insulin  aspart (novoLOG ) injection 0-15 Units  0-15 Units Subcutaneous TID WC Singh, Prashant K, MD   5 Units at 09/30/24 1148   insulin  aspart (novoLOG ) injection 0-5 Units  0-5 Units Subcutaneous QHS Singh, Prashant K, MD   3 Units at 09/29/24 2126   insulin  aspart (novoLOG ) injection 4 Units  4 Units Subcutaneous TID WC Raenelle Donalda HERO, MD   4 Units at 09/30/24 1148   insulin  glargine (LANTUS ) injection 30 Units  30 Units Subcutaneous Daily Ghimire, Shanker M, MD   30 Units at 09/30/24 1109   ipratropium (ATROVENT ) nebulizer  solution 0.5 mg  0.5 mg Nebulization Q6H WA Ghimire, Shanker M, MD   0.5 mg at 09/30/24 1503   isosorbide  mononitrate (IMDUR ) 24 hr tablet 30 mg  30 mg Oral Daily Singh, Prashant K, MD   30 mg at 09/30/24 0946   mirabegron  ER (MYRBETRIQ ) tablet 50 mg  50 mg Oral QHS Singh, Prashant K, MD   50 mg at 09/29/24 2127   nitroGLYCERIN  (NITROSTAT ) SL tablet 0.4 mg  0.4 mg Sublingual Q5 min PRN Singh, Prashant K, MD       pantoprazole  (PROTONIX ) EC tablet 40 mg  40 mg Oral Daily Singh, Prashant K, MD   40 mg at 09/30/24 0946   QUEtiapine  (SEROQUEL ) tablet 25 mg  25 mg Oral QHS Singh, Prashant K, MD   25 mg at 09/29/24 2136   tamsulosin  (FLOMAX ) capsule 0.4 mg  0.4 mg Oral Daily Singh, Prashant K, MD   0.4 mg at 09/30/24 9053     Discharge Medications: Please see discharge summary for a list of discharge medications.  Relevant Imaging Results:  Relevant Lab Results:   Additional Information SS#: [id number]  Inocente GORMAN Kindle,  LCSW     "

## 2024-10-01 ENCOUNTER — Inpatient Hospital Stay (HOSPITAL_COMMUNITY)

## 2024-10-01 DIAGNOSIS — J189 Pneumonia, unspecified organism: Secondary | ICD-10-CM | POA: Diagnosis not present

## 2024-10-01 DIAGNOSIS — I480 Paroxysmal atrial fibrillation: Secondary | ICD-10-CM | POA: Diagnosis not present

## 2024-10-01 DIAGNOSIS — I10 Essential (primary) hypertension: Secondary | ICD-10-CM | POA: Diagnosis not present

## 2024-10-01 DIAGNOSIS — G7000 Myasthenia gravis without (acute) exacerbation: Secondary | ICD-10-CM | POA: Diagnosis not present

## 2024-10-01 LAB — GLUCOSE, CAPILLARY
Glucose-Capillary: 196 mg/dL — ABNORMAL HIGH (ref 70–99)
Glucose-Capillary: 184 mg/dL — ABNORMAL HIGH (ref 70–99)
Glucose-Capillary: 248 mg/dL — ABNORMAL HIGH (ref 70–99)
Glucose-Capillary: 129 mg/dL — ABNORMAL HIGH (ref 70–99)

## 2024-10-01 LAB — BASIC METABOLIC PANEL WITH GFR
Anion gap: 13 (ref 5–15)
BUN: 78 mg/dL — ABNORMAL HIGH (ref 8–23)
CO2: 19 mmol/L — ABNORMAL LOW (ref 22–32)
Calcium: 8 mg/dL — ABNORMAL LOW (ref 8.9–10.3)
Chloride: 107 mmol/L (ref 98–111)
Creatinine, Ser: 3.21 mg/dL — ABNORMAL HIGH (ref 0.61–1.24)
GFR, Estimated: 19 mL/min — ABNORMAL LOW
Glucose, Bld: 197 mg/dL — ABNORMAL HIGH (ref 70–99)
Potassium: 4 mmol/L (ref 3.5–5.1)
Sodium: 139 mmol/L (ref 135–145)

## 2024-10-01 LAB — CBC
HCT: 29.3 % — ABNORMAL LOW (ref 39.0–52.0)
Hemoglobin: 9.6 g/dL — ABNORMAL LOW (ref 13.0–17.0)
MCH: 27.3 pg (ref 26.0–34.0)
MCHC: 32.8 g/dL (ref 30.0–36.0)
MCV: 83.2 fL (ref 80.0–100.0)
Platelets: 257 K/uL (ref 150–400)
RBC: 3.52 MIL/uL — ABNORMAL LOW (ref 4.22–5.81)
RDW: 16.7 % — ABNORMAL HIGH (ref 11.5–15.5)
WBC: 13.2 K/uL — ABNORMAL HIGH (ref 4.0–10.5)
nRBC: 0 % (ref 0.0–0.2)

## 2024-10-01 MED ORDER — SODIUM CHLORIDE 0.9 % IV SOLN
2.0000 g | INTRAVENOUS | Status: DC
  Administered 2024-10-01 – 2024-10-02 (×2): 2 g via INTRAVENOUS
  Filled 2024-10-01 (×2): qty 20

## 2024-10-01 MED ORDER — METRONIDAZOLE 500 MG PO TABS
500.0000 mg | ORAL_TABLET | Freq: Two times a day (BID) | ORAL | Status: DC
  Administered 2024-10-01 – 2024-10-03 (×5): 500 mg via ORAL
  Filled 2024-10-01 (×5): qty 1

## 2024-10-01 NOTE — Progress Notes (Signed)
 NIF: -30 FVC: 1.0  With good pt efforts

## 2024-10-01 NOTE — Plan of Care (Signed)

## 2024-10-01 NOTE — Progress Notes (Signed)
 "                        PROGRESS NOTE        PATIENT DETAILS Name: Fernando Hess Age: 79 y.o. Sex: male Date of Birth: 28-Sep-1945 Admit Date: 09/27/2024 Admitting Physician Lavada MARLA Stank, MD ERE:Mndd, Carlin Redbird, MD  Brief Summary: 79 year old with history of myasthenia gravis (currently not on any treatment-per prior outpatient note-failed Mestinon/IVIG/prednisone )-HFpEF, A-fib, DM-2, HTN-who presented with shortness of breath after a choking episode/vomiting-found to have acute hypoxic respiratory failure secondary to aspiration pneumonia.   Significant events 1/13>> admit 1/14>> PCCM consult for worsening hypoxemia-placed on BiPAP-IV steroids-made DNR. 1/15>> liberated off BiPAP last night.  Stable on either room air or 2 L of oxygen.  Significant studies: 1/13>> CT head: No acute intracranial abnormality 1/13>> CT chest/abdomen/pelvis: Airspace opacity right upper lobe.  Significant microbiology data: 1/13>> COVID/influenza/RSV PCR: Negative 1/13>> respiratory virus panel: Negative 1/13>> blood culture: No growth  Procedures: None  Consults: PCCM  Subjective: Lying comfortably in bed-denies any major issues-claims he is eating-discussed with night RN-bladder scan was around 78 cc.  Per night nurse-patient is voiding well without any major issues.  Objective: Vitals: Blood pressure (!) 144/68, pulse 62, temperature (!) 97.4 F (36.3 C), temperature source Oral, resp. rate (!) 25, height 5' 6 (1.676 m), weight 82 kg, SpO2 96%.   Exam: Pleasantly confused but awake/alert Chest: Few bibasilar rales-but otherwise clear to auscultation. Abdomen: Soft nontender nondistended Extremities: Trace edema Nonfocal exam.   Pertinent Labs/Radiology:    Latest Ref Rng & Units 10/01/2024    3:39 AM 09/30/2024    3:57 AM 09/28/2024    5:11 AM  CBC  WBC 4.0 - 10.5 K/uL 13.2  14.0  14.4   Hemoglobin 13.0 - 17.0 g/dL 9.6  9.7  9.6   Hematocrit 39.0 - 52.0 % 29.3  29.5   30.3   Platelets 150 - 400 K/uL 257  241  178     Lab Results  Component Value Date   NA 139 10/01/2024   K 4.0 10/01/2024   CL 107 10/01/2024   CO2 19 (L) 10/01/2024      Assessment/Plan: Acute hypoxic respiratory failure secondary to aspiration pneumonia Required BiPAP on 1/15-currently stable on 2-3 L of oxygen Lungs are clear to exam Continue Unasyn  No longer on Solu-Medrol -on prednisone  for a few more days-lungs are relatively clear on exam Continue incentive spirometry/flutter valve. Attempt to titrate off oxygen   AKI on CKD 4 Suspect AKI is hemodynamically mediated UA with baseline proteinuria-unchanged. No evidence of urinary retention and frequent bladder scans Awaiting renal ultrasound Unfortunately-creatinine continues to trend up-Will discuss with nephrology but suspect care is mostly supportive for now.     History of seronegative myasthenia gravis Reviewed outpatient neurology note-March 2025-apparently has failed IVIG/Mestinon/prednisone -currently not on any medications. He seems to be stable currently-his hypoxia/respiratory issues are from his aspiration pneumonia.  His NIF/FVC is currently stable-ABG a few days ago did not show any hypercarbia.  He did well with his modified barium swallow and is tolerating swallowing/diet pretty well. Continue to watch closely-if needed-we can always consult neurology.    Chronic HFpEF Relatively euvolemic-some trace edema in his legs Holding diuretics for now   PAF Telemetry monitoring Cardizem /Eliquis    CAD Currently without any anginal symptoms Supportive care.   HTN BP relatively stable Cardizem /Imdur /hydralazine   Hypoglycemia Mild Resolved with D5 infusion-stop D5W-encourage oral intake   DM-2 (A1c 8.8 on  10/15) with uncontrolled hyperglycemia secondary to steroids CBGs relatively stable Continue Lantus  30 units daily + 4 units of NovoLog  with meals + SSI Since no longer on prednisone -suspect CBG will  stabilize even further-reassess on 1/18.    Recent Labs    09/30/24 1602 09/30/24 2124 10/01/24 0758  GLUCAP 236* 240* 196*     Dementia Delirium precautions Aricept /Seroquel    OSA Per prior notes-uses 2 L of oxygen at night.   BPH Flomax  Frequent bladder scans-do not show any evidence of retention so far.  Code status:   Code Status: Limited: Do not attempt resuscitation (DNR) -DNR-LIMITED -Do Not Intubate/DNI    DVT Prophylaxis: apixaban  (ELIQUIS ) tablet 2.5 mg Start: 09/27/24 1500 apixaban  (ELIQUIS ) tablet 2.5 mg    Family Communication: Spouse-Jean Stmarie-[phone] updated over the phone 1/16   Disposition Plan: Status is: Inpatient Remains inpatient appropriate because: Severity of illness   Planned Discharge Destination:Home health   Diet: Diet Order             Diet regular Room service appropriate? No; Fluid consistency: Thin  Diet effective now                     Antimicrobial agents: Anti-infectives (From admission, onward)    Start     Dose/Rate Route Frequency Ordered Stop   09/29/24 1000  Ampicillin -Sulbactam (UNASYN ) 3 g in sodium chloride  0.9 % 100 mL IVPB        3 g 200 mL/hr over 30 Minutes Intravenous Every 12 hours 09/29/24 0622     09/28/24 1400  Ampicillin -Sulbactam (UNASYN ) 3 g in sodium chloride  0.9 % 100 mL IVPB  Status:  Discontinued        3 g 200 mL/hr over 30 Minutes Intravenous Every 8 hours 09/28/24 1207 09/29/24 0622   09/27/24 1600  Ampicillin -Sulbactam (UNASYN ) 3 g in sodium chloride  0.9 % 100 mL IVPB  Status:  Discontinued        3 g 200 mL/hr over 30 Minutes Intravenous Every 12 hours 09/27/24 1509 09/28/24 1207   09/27/24 1330  cefTRIAXone  (ROCEPHIN ) 2 g in sodium chloride  0.9 % 100 mL IVPB        2 g 200 mL/hr over 30 Minutes Intravenous  Once 09/27/24 1323 09/27/24 1411        MEDICATIONS: Scheduled Meds:  apixaban   2.5 mg Oral BID   cyanocobalamin   1,000 mcg Intramuscular Weekly   diltiazem   90 mg  Oral Q8H   insulin  aspart  0-15 Units Subcutaneous TID WC   insulin  aspart  0-5 Units Subcutaneous QHS   insulin  aspart  4 Units Subcutaneous TID WC   insulin  glargine  30 Units Subcutaneous Daily   ipratropium  0.5 mg Nebulization Q6H WA   isosorbide  mononitrate  30 mg Oral Daily   mirabegron  ER  50 mg Oral QHS   pantoprazole   40 mg Oral Daily   QUEtiapine   25 mg Oral QHS   tamsulosin   0.4 mg Oral Daily   Continuous Infusions:  sodium chloride  50 mL/hr at 09/30/24 1614   ampicillin -sulbactam (UNASYN ) IV 3 g (10/01/24 0935)   PRN Meds:.acetaminophen  **OR** acetaminophen , albuterol , bisacodyl , diltiazem , hydrALAZINE , nitroGLYCERIN    I have personally reviewed following labs and imaging studies  LABORATORY DATA: CBC: Recent Labs  Lab 09/27/24 1035 09/27/24 1100 09/28/24 0511 09/30/24 0357 10/01/24 0339  WBC 18.1*  --  14.4* 14.0* 13.2*  NEUTROABS 15.4*  --  11.0*  --   --   HGB 10.4* 10.5*  9.6* 9.7* 9.6*  HCT 32.8* 31.0* 30.3* 29.5* 29.3*  MCV 86.3  --  85.4 83.3 83.2  PLT 200  --  178 241 257    Basic Metabolic Panel: Recent Labs  Lab 09/27/24 1035 09/27/24 1100 09/28/24 0511 09/29/24 0212 09/30/24 0357 10/01/24 0339  NA 139 140 146* 135 135 139  K 4.2 4.2 3.0* 4.7 4.1 4.0  CL 107 107 118* 102 104 107  CO2 21*  --  19* 20* 20* 19*  GLUCOSE 339* 336* 147* 302* 334* 197*  BUN 34* 40* 33* 48* 67* 78*  CREATININE 2.16* 2.20* 1.86* 2.73* 2.93* 3.21*  CALCIUM  7.5*  --  5.9* 8.1* 7.8* 8.0*  MG  --   --  1.6*  --   --   --     GFR: Estimated Creatinine Clearance: 18.5 mL/min (A) (by C-G formula based on SCr of 3.21 mg/dL (H)).  Liver Function Tests: Recent Labs  Lab 09/27/24 1035 09/28/24 0511  AST 17 16  ALT 8 7  ALKPHOS 105 73  BILITOT 1.1 0.6  PROT 5.7* 4.3*  ALBUMIN  3.1* 2.4*   Recent Labs  Lab 09/27/24 1035  LIPASE 11   No results for input(s): AMMONIA in the last 168 hours.  Coagulation Profile: Recent Labs  Lab 09/27/24 1035  INR  2.2*    Cardiac Enzymes: No results for input(s): CKTOTAL, CKMB, CKMBINDEX, TROPONINI in the last 168 hours.  BNP (last 3 results) Recent Labs    09/27/24 1035  PROBNP 5,797.0*    Lipid Profile: No results for input(s): CHOL, HDL, LDLCALC, TRIG, CHOLHDL, LDLDIRECT in the last 72 hours.  Thyroid  Function Tests: No results for input(s): TSH, T4TOTAL, FREET4, T3FREE, THYROIDAB in the last 72 hours.   Anemia Panel: No results for input(s): VITAMINB12, FOLATE, FERRITIN, TIBC, IRON, RETICCTPCT in the last 72 hours.  Urine analysis:    Component Value Date/Time   COLORURINE YELLOW 09/27/2024 1544   APPEARANCEUR CLOUDY (A) 09/27/2024 1544   LABSPEC 1.012 09/27/2024 1544   PHURINE 5.0 09/27/2024 1544   GLUCOSEU >=500 (A) 09/27/2024 1544   HGBUR SMALL (A) 09/27/2024 1544   BILIRUBINUR NEGATIVE 09/27/2024 1544   KETONESUR NEGATIVE 09/27/2024 1544   PROTEINUR 100 (A) 09/27/2024 1544   UROBILINOGEN 0.2 10/02/2009 1604   NITRITE NEGATIVE 09/27/2024 1544   LEUKOCYTESUR LARGE (A) 09/27/2024 1544    Sepsis Labs: Lactic Acid, Venous    Component Value Date/Time   LATICACIDVEN 1.6 09/27/2024 1048    MICROBIOLOGY: Recent Results (from the past 240 hours)  Resp panel by RT-PCR (RSV, Flu A&B, Covid) Anterior Nasal Swab     Status: None   Collection Time: 09/27/24 12:15 PM   Specimen: Anterior Nasal Swab  Result Value Ref Range Status   SARS Coronavirus 2 by RT PCR NEGATIVE NEGATIVE Final   Influenza A by PCR NEGATIVE NEGATIVE Final   Influenza B by PCR NEGATIVE NEGATIVE Final    Comment: (NOTE) The Xpert Xpress SARS-CoV-2/FLU/RSV plus assay is intended as an aid in the diagnosis of influenza from Nasopharyngeal swab specimens and should not be used as a sole basis for treatment. Nasal washings and aspirates are unacceptable for Xpert Xpress SARS-CoV-2/FLU/RSV testing.  Fact Sheet for  Patients: bloggercourse.com  Fact Sheet for Healthcare Providers: seriousbroker.it  This test is not yet approved or cleared by the [place]  FDA and has been authorized for detection and/or diagnosis of SARS-CoV-2 by FDA under an Emergency Use Authorization (EUA). This EUA will remain in effect (  meaning this test can be used) for the duration of the COVID-19 declaration under Section 564(b)(1) of the Act, 21 U.S.C. section 360bbb-3(b)(1), unless the authorization is terminated or revoked.     Resp Syncytial Virus by PCR NEGATIVE NEGATIVE Final    Comment: (NOTE) Fact Sheet for Patients: bloggercourse.com  Fact Sheet for Healthcare Providers: seriousbroker.it  This test is not yet approved or cleared by the [place]  FDA and has been authorized for detection and/or diagnosis of SARS-CoV-2 by FDA under an Emergency Use Authorization (EUA). This EUA will remain in effect (meaning this test can be used) for the duration of the COVID-19 declaration under Section 564(b)(1) of the Act, 21 U.S.C. section 360bbb-3(b)(1), unless the authorization is terminated or revoked.  Performed at [facility], [address] [place], [name] [address]   Blood culture (routine x 2)     Status: None (Preliminary result)   Collection Time: 09/27/24  1:24 PM   Specimen: BLOOD  Result Value Ref Range Status   Specimen Description BLOOD RIGHT ANTECUBITAL  Final   Special Requests   Final    BOTTLES DRAWN AEROBIC AND ANAEROBIC Blood Culture adequate volume   Culture   Final    NO GROWTH 4 DAYS Performed at [facility], [address], [place], [name] [address]    Report Status PENDING  Incomplete  Blood culture (routine x 2)     Status: None (Preliminary result)   Collection Time: 09/27/24  1:29 PM   Specimen: BLOOD RIGHT HAND  Result Value Ref Range Status   Specimen  Description BLOOD RIGHT HAND  Final   Special Requests   Final    BOTTLES DRAWN AEROBIC AND ANAEROBIC Blood Culture adequate volume   Culture   Final    NO GROWTH 4 DAYS Performed at [facility], [address] [place], [name] [address]    Report Status PENDING  Incomplete  MRSA Next Gen by PCR, Nasal     Status: None   Collection Time: 09/27/24  2:58 PM   Specimen: Nasal Mucosa; Nasal Swab  Result Value Ref Range Status   MRSA by PCR Next Gen NOT DETECTED NOT DETECTED Final    Comment: (NOTE) The GeneXpert MRSA Assay (FDA approved for NASAL specimens only), is one component of a comprehensive MRSA colonization surveillance program. It is not intended to diagnose MRSA infection nor to guide or monitor treatment for MRSA infections. Test performance is not FDA approved in patients less than 39 years old. Performed at [facility], [address], [place], [name] [address]   Respiratory (~20 pathogens) panel by PCR     Status: None   Collection Time: 09/27/24  3:20 PM   Specimen: Nasopharyngeal Swab; Respiratory  Result Value Ref Range Status   Adenovirus NOT DETECTED NOT DETECTED Final   Coronavirus 229E NOT DETECTED NOT DETECTED Final    Comment: (NOTE) The Coronavirus on the Respiratory Panel, DOES NOT test for the novel  Coronavirus (2019 nCoV)    Coronavirus HKU1 NOT DETECTED NOT DETECTED Final   Coronavirus NL63 NOT DETECTED NOT DETECTED Final   Coronavirus OC43 NOT DETECTED NOT DETECTED Final   Metapneumovirus NOT DETECTED NOT DETECTED Final   Rhinovirus / Enterovirus NOT DETECTED NOT DETECTED Final   Influenza A NOT DETECTED NOT DETECTED Final   Influenza B NOT DETECTED NOT DETECTED Final   Parainfluenza Virus 1 NOT DETECTED NOT DETECTED Final   Parainfluenza Virus 2 NOT DETECTED  NOT DETECTED Final   Parainfluenza Virus 3 NOT DETECTED NOT DETECTED Final   Parainfluenza Virus 4 NOT DETECTED NOT DETECTED Final   Respiratory Syncytial Virus NOT DETECTED  NOT DETECTED Final   Bordetella pertussis NOT DETECTED NOT DETECTED Final   Bordetella Parapertussis NOT DETECTED NOT DETECTED Final   Chlamydophila pneumoniae NOT DETECTED NOT DETECTED Final   Mycoplasma pneumoniae NOT DETECTED NOT DETECTED Final    Comment: Performed at [facility], [address], [place], [name] [address]    RADIOLOGY STUDIES/RESULTS: No results found.    LOS: 4 days   Donalda Applebaum, MD  [facility]    To contact the attending provider between 7A-7P or the covering provider during after hours 7P-7A, please log into the web site [email or web address] and access using universal [place] password for that web site. If you do not have the password, please call the hospital operator.  10/01/2024, 9:39 AM    "

## 2024-10-01 NOTE — Progress Notes (Addendum)
 Pt currently unavailable for NIF/VC- therapy in room w/ patient  1505- pt just moved to chair and is SOB from movement- unable to do NIF/VC at this time

## 2024-10-01 NOTE — TOC Progression Note (Addendum)
 Transition of Care [facility]) - Progression Note    Patient Details  Name: Fernando Hess MRN: [id number] Date of Birth: May 22, 1946  Transition of Care [facility]) CM/SW Contact  Bridget Cordella Simmonds, LCSW Phone Number: 10/01/2024, 10:11 AM  Clinical Narrative:   PASSR initiated, docs uploaded.   TC Wife Cy.  CSW informed her that [facility] has offered a bed.  Cy reports that she has decided would like for pt to DC home with [facility], does not want to pursue SNF.  She also asked about volunteer coverage over [facility] holiday--she is not able to walk from her car to [address].  If there is no volunteer to bring her, she will need someone to come to her in her car at the main entrance to discuss discharge instructions.   Team updated.    Expected Discharge Plan: Skilled Nursing Facility Barriers to Discharge: English As A Second Language Teacher, Continued Medical Work up, SNF Pending bed offer               Expected Discharge Plan and Services In-house Referral: Clinical Social Work Discharge Planning Services: [employer] Consult Post Acute Care Choice: Skilled Nursing Facility Living arrangements for the past 2 months: Single Family Home                           HH Arranged: PT, Speech Therapy HH Agency: [facility] Date HH Agency Contacted: 09/29/24 Time HH Agency Contacted: 1013 Representative spoke with at [facility] Agency: sent via hub   Social Drivers of Health (SDOH) Interventions SDOH Screenings   Food Insecurity: No Food Insecurity (09/28/2024)  Housing: Low Risk (09/28/2024)  Transportation Needs: No Transportation Needs (09/28/2024)  Utilities: Not At Risk (09/28/2024)  Social Connections: Moderately Isolated (09/28/2024)  Tobacco Use: Low Risk (08/03/2024)    Readmission Risk Interventions    09/30/2024    2:31 PM 06/30/2024   10:52 AM 02/10/2024    3:26 PM  Readmission Risk Prevention Plan  Transportation Screening Complete Complete Complete  PCP or Specialist Appt within 3-5 Days    Complete  HRI or Home Care Consult   Complete  Palliative Care Screening   Not Applicable  Medication Review (RN Care Manager) Complete Complete Complete  PCP or Specialist appointment within 3-5 days of discharge Complete    HRI or Home Care Consult Complete Complete   SW Recovery Care/Counseling Consult Complete    Palliative Care Screening Not Applicable Not Applicable   Skilled Nursing Facility Complete Not Applicable

## 2024-10-01 NOTE — Progress Notes (Signed)
 Physical Therapy Treatment Patient Details Name: Fernando Hess MRN: [id number] DOB: [birth year] Today's Date: 10/01/2024   History of Present Illness Pt is 79 year old presented [facility]  09/28/23 for falls and weakness. Pt with acute respiratory failure with hypoxia and sepsis secondary to aspiration PNA. PMH - CVA, myasthenia gravis, DM II, CAD status post CABG, chronic HFpEF, CKD IV, depression, anxiety, dementia, and OSA on CPAP, afib    PT Comments  Pt SOB throughout activity with SpO2 monitored and remaining >93% on 4L. Pt requests hand held assist to pull trunk upright to sit EOB and demonstrates good sitting balance ~5 minutes dangling EOB performing static and dynamic tasks for dressing. Pt minA for STS from slightly elevated surface, initially demonstrating forward posture and correcting once upright. Pt ambulates in hallway CGA without seated rest break or LOB. Pt with audible increase in respiratory efforts but SpO2 WNL. Pt encouraged to take deep breaths during activity to manage RR.  Pt able to mobilize household distances CGA with physical assist only to manage lines. Pt would benefit from continued PT services focused on bed mobility, balance, gait, and breathing techniques to promote tolerance and independence with functional mobility.    If plan is discharge home, recommend the following: A little help with walking and/or transfers;A little help with bathing/dressing/bathroom;Assistance with cooking/housework;Supervision due to cognitive status;Assist for transportation;Help with stairs or ramp for entrance;Direct supervision/assist for medications management;Direct supervision/assist for financial management   Can travel by private vehicle        Equipment Recommendations  None recommended by PT (Pt has appropriate equipment at home)    Recommendations for Other Services       Precautions / Restrictions Precautions Precautions: Fall;Other (comment) Recall of  Precautions/Restrictions: Impaired Precaution/Restrictions Comments: Watch SpO2 Restrictions Weight Bearing Restrictions Per Provider Order: No     Mobility  Bed Mobility Overal bed mobility: Needs Assistance Bed Mobility: Supine to Sit     Supine to sit: Min assist, Used rails     General bed mobility comments: Hand held assist to pull trunk upright, otherwise CGA for bed mobility.    Transfers Overall transfer level: Needs assistance Equipment used: Rollator (4 wheels) Transfers: Sit to/from Stand Sit to Stand: Min assist           General transfer comment: Pt with initial difficulty initiating stance, height of bed elevated and pt completed with minA. Increased time to complete upright posture.    Ambulation/Gait Ambulation/Gait assistance: Contact guard assist Gait Distance (Feet): 90 Feet (90'x2) Assistive device: Rollator (4 wheels) Gait Pattern/deviations: Step-through pattern, Decreased stride length, Trunk flexed   Gait velocity interpretation: 1.31 - 2.62 ft/sec, indicative of limited community ambulator   General Gait Details: Pt demonstrates steady gait mechanics throughout. Some cues needed to navigate hallway due to IV pole. Declines seated rest break. Appears SOB throughout, SpO2 remains WNL. Difficulty navigating tight spaces.   Stairs             Wheelchair Mobility     Tilt Bed    Modified Rankin (Stroke Patients Only)       Balance Overall balance assessment: Needs assistance Sitting-balance support: Feet supported, No upper extremity supported Sitting balance-Leahy Scale: Good Sitting balance - Comments: Good trunk control EOB, maintains static sitting ~5 minutes.   Standing balance support: Bilateral upper extremity supported, During functional activity Standing balance-Leahy Scale: Fair Standing balance comment: Requires rollator for balance during standing tasks. No LOB with turns.  Communication Communication Communication: Impaired Factors Affecting Communication: Difficulty expressing self (Speaks quickly between breaths)  Cognition Arousal: Alert Behavior During Therapy: WFL for tasks assessed/performed   PT - Cognitive impairments: History of cognitive impairments, Problem solving, Memory, Awareness                       PT - Cognition Comments: Slow processing, occasional reorienting to task needed. Following commands: Impaired Following commands impaired: Follows multi-step commands with increased time    Cueing Cueing Techniques: Verbal cues, Tactile cues, Visual cues  Exercises      General Comments General comments (skin integrity, edema, etc.): VSS throughout. Blood noted on gown, likely coming from L IV. RN notified. No new skin abnormalities noted.      Pertinent Vitals/Pain Pain Assessment Pain Assessment: No/denies pain    Home Living                          Prior Function            PT Goals (current goals can now be found in the care plan section) Acute Rehab PT Goals Patient Stated Goal: return home PT Goal Formulation: With patient Time For Goal Achievement: 10/12/24 Potential to Achieve Goals: Good Progress towards PT goals: Progressing toward goals    Frequency    Min 2X/week      PT Plan      Co-evaluation              AM-PAC PT 6 Clicks Mobility   Outcome Measure  Help needed turning from your back to your side while in a flat bed without using bedrails?: None Help needed moving from lying on your back to sitting on the side of a flat bed without using bedrails?: A Little Help needed moving to and from a bed to a chair (including a wheelchair)?: A Little Help needed standing up from a chair using your arms (e.g., wheelchair or bedside chair)?: A Little Help needed to walk in hospital room?: A Little Help needed climbing 3-5 steps with a railing? : A Lot 6 Click Score: 18    End  of Session Equipment Utilized During Treatment: Gait belt;Oxygen Activity Tolerance: Patient limited by fatigue (Pt limited by SOB) Patient left: in chair;with call bell/phone within reach;with chair alarm set Nurse Communication: Mobility status PT Visit Diagnosis: Unsteadiness on feet (R26.81);Muscle weakness (generalized) (M62.81)     Time: 1435-1500 PT Time Calculation (min) (ACUTE ONLY): 25 min  Charges:    $Gait Training: 8-22 mins $Therapeutic Activity: 8-22 mins PT General Charges $$ ACUTE PT VISIT: 1 Visit                     Sabra Morel, PT, DPT  Acute Rehabilitation Services         Office: [phone]      Sabra MARLA Morel 10/01/2024, 3:19 PM

## 2024-10-01 NOTE — Progress Notes (Signed)
 Re: NIF/VC  During the 0800 rounds pt was too sleepy to perform NIF/VC.  1147- unable to do NIF and VC d/t pt is currently out of room.

## 2024-10-02 DIAGNOSIS — J189 Pneumonia, unspecified organism: Secondary | ICD-10-CM | POA: Diagnosis not present

## 2024-10-02 DIAGNOSIS — G7000 Myasthenia gravis without (acute) exacerbation: Secondary | ICD-10-CM | POA: Diagnosis not present

## 2024-10-02 DIAGNOSIS — I10 Essential (primary) hypertension: Secondary | ICD-10-CM | POA: Diagnosis not present

## 2024-10-02 DIAGNOSIS — I480 Paroxysmal atrial fibrillation: Secondary | ICD-10-CM | POA: Diagnosis not present

## 2024-10-02 LAB — CULTURE, BLOOD (ROUTINE X 2)
Culture: NO GROWTH
Culture: NO GROWTH
Special Requests: ADEQUATE
Special Requests: ADEQUATE

## 2024-10-02 LAB — BASIC METABOLIC PANEL WITH GFR
Anion gap: 11 (ref 5–15)
BUN: 70 mg/dL — ABNORMAL HIGH (ref 8–23)
CO2: 20 mmol/L — ABNORMAL LOW (ref 22–32)
Calcium: 8 mg/dL — ABNORMAL LOW (ref 8.9–10.3)
Chloride: 110 mmol/L (ref 98–111)
Creatinine, Ser: 2.82 mg/dL — ABNORMAL HIGH (ref 0.61–1.24)
GFR, Estimated: 22 mL/min — ABNORMAL LOW
Glucose, Bld: 101 mg/dL — ABNORMAL HIGH (ref 70–99)
Potassium: 3.7 mmol/L (ref 3.5–5.1)
Sodium: 142 mmol/L (ref 135–145)

## 2024-10-02 LAB — GLUCOSE, CAPILLARY
Glucose-Capillary: 107 mg/dL — ABNORMAL HIGH (ref 70–99)
Glucose-Capillary: 173 mg/dL — ABNORMAL HIGH (ref 70–99)
Glucose-Capillary: 148 mg/dL — ABNORMAL HIGH (ref 70–99)
Glucose-Capillary: 63 mg/dL — ABNORMAL LOW (ref 70–99)
Glucose-Capillary: 80 mg/dL (ref 70–99)
Glucose-Capillary: 101 mg/dL — ABNORMAL HIGH (ref 70–99)

## 2024-10-02 MED ORDER — TAMSULOSIN HCL 0.4 MG PO CAPS
0.8000 mg | ORAL_CAPSULE | Freq: Every day | ORAL | Status: DC
  Administered 2024-10-03: 0.8 mg via ORAL
  Filled 2024-10-02: qty 2

## 2024-10-02 NOTE — Progress Notes (Signed)
 "                        PROGRESS NOTE        PATIENT DETAILS Name: Fernando Hess Age: 79 y.o. Sex: male Date of Birth: 1945-12-14 Admit Date: 09/27/2024 Admitting Physician Lavada MARLA Stank, MD ERE:Mndd, Carlin Redbird, MD  Brief Summary: 79 year old with history of myasthenia gravis (currently not on any treatment-per prior outpatient note-failed Mestinon/IVIG/prednisone )-HFpEF, A-fib, DM-2, HTN-who presented with shortness of breath after a choking episode/vomiting-found to have acute hypoxic respiratory failure secondary to aspiration pneumonia.   Significant events 1/13>> admit 1/14>> PCCM consult for worsening hypoxemia-placed on BiPAP-IV steroids-made DNR. 1/15>> liberated off BiPAP last night.  Stable on either room air or 2 L of oxygen.  Significant studies: 1/13>> CT head: No acute intracranial abnormality 1/13>> CT chest/abdomen/pelvis: Airspace opacity right upper lobe. 1/17>> renal ultrasound: No hydronephrosis  Significant microbiology data: 1/13>> COVID/influenza/RSV PCR: Negative 1/13>> respiratory virus panel: Negative 1/13>> blood culture: No growth  Procedures: None  Consults: PCCM  Subjective: Sitting at bedside-on room air.  Per my discussion with RN-patient did require in/out catheter x 1 overnight.  Objective: Vitals: Blood pressure 123/64, pulse 61, temperature (!) 97.4 F (36.3 C), temperature source Oral, resp. rate 19, height 5' 6 (1.676 m), weight 82 kg, SpO2 94%.   Exam: Awake/alert-answers simple questions appropriately Chest: Relatively clear to auscultation CVS: S1-S2 regular Abdomen: Soft nontender nondistended Extremities: Trace edema Nonfocal exam  Pertinent Labs/Radiology:    Latest Ref Rng & Units 10/01/2024    3:39 AM 09/30/2024    3:57 AM 09/28/2024    5:11 AM  CBC  WBC 4.0 - 10.5 K/uL 13.2  14.0  14.4   Hemoglobin 13.0 - 17.0 g/dL 9.6  9.7  9.6   Hematocrit 39.0 - 52.0 % 29.3  29.5  30.3   Platelets 150 - 400 K/uL 257   241  178     Lab Results  Component Value Date   NA 142 10/02/2024   K 3.7 10/02/2024   CL 110 10/02/2024   CO2 20 (L) 10/02/2024      Assessment/Plan: Acute hypoxic respiratory failure secondary to aspiration pneumonia Required BiPAP on 1/15-currently clinically improved clinically improved-on room air this morning. Overall improved-switched from Unasyn  to Rocephin /Flagyl  (due to concern for AIN causing AKI)-Will stop all antibiotics 1/19. Continue incentive spirometry/flutter valve.   AKI on CKD 4 Suspect AKI is hemodynamically mediated No hydronephrosis on renal ultrasound Recent bladder scans were negative-however overnight did require in/out catheterization x 1-approximately 500 cc was obtained. Thankfully creatinine seems to have plateaued and is now trending down-Will ask nursing staff to continue bladder scans-if he has 1 additional episode of retention-he will require Foley catheter placement. Avoid nephrotoxic agents Repeat electrolytes tomorrow.   History of seronegative myasthenia gravis Reviewed outpatient neurology note-March 2025-apparently has failed IVIG/Mestinon/prednisone -currently not on any medications. He seems to be stable currently-his hypoxia/respiratory issues are from his aspiration pneumonia.  His NIF/FVC is currently stable-ABG a few days ago did not show any hypercarbia.  He did well with his modified barium swallow and is tolerating swallowing/diet pretty well. Continue to watch closely-if needed-we can always consult neurology.    Chronic HFpEF Relatively euvolemic-some trace edema in his legs Holding diuretics for now   PAF Telemetry monitoring Cardizem /Eliquis    CAD Currently without any anginal symptoms Supportive care.   HTN BP relatively stable Cardizem /Imdur /hydralazine   Hypoglycemia Mild Resolved with D5 infusion-stop D5W-encourage oral intake  DM-2 (A1c 8.8 on 10/15) with uncontrolled hyperglycemia secondary to steroids CBGs  relatively stable Continue Lantus  30 units daily + 4 units of NovoLog  with meals + SSI   Recent Labs    10/01/24 1600 10/01/24 2110 10/02/24 0801  GLUCAP 248* 129* 107*     Dementia Delirium precautions Aricept /Seroquel    OSA Per prior notes-uses 2 L of oxygen at night.   BPH Flomax  Frequent bladder scans-do not show any evidence of retention so far.  Code status:   Code Status: Limited: Do not attempt resuscitation (DNR) -DNR-LIMITED -Do Not Intubate/DNI    DVT Prophylaxis: apixaban  (ELIQUIS ) tablet 2.5 mg Start: 09/27/24 1500 apixaban  (ELIQUIS ) tablet 2.5 mg    Family Communication: Spouse-Jean Breighner-[phone] -Left VM- 1/18   Disposition Plan: Status is: Inpatient Remains inpatient appropriate because: Severity of illness   Planned Discharge Destination:Home health   Diet: Diet Order             Diet regular Room service appropriate? No; Fluid consistency: Thin  Diet effective now                     Antimicrobial agents: Anti-infectives (From admission, onward)    Start     Dose/Rate Route Frequency Ordered Stop   10/01/24 1145  cefTRIAXone  (ROCEPHIN ) 2 g in sodium chloride  0.9 % 100 mL IVPB        2 g 200 mL/hr over 30 Minutes Intravenous Every 24 hours 10/01/24 1047     10/01/24 1145  metroNIDAZOLE  (FLAGYL ) tablet 500 mg        500 mg Oral Every 12 hours 10/01/24 1047     09/29/24 1000  Ampicillin -Sulbactam (UNASYN ) 3 g in sodium chloride  0.9 % 100 mL IVPB  Status:  Discontinued        3 g 200 mL/hr over 30 Minutes Intravenous Every 12 hours 09/29/24 0622 10/01/24 1046   09/28/24 1400  Ampicillin -Sulbactam (UNASYN ) 3 g in sodium chloride  0.9 % 100 mL IVPB  Status:  Discontinued        3 g 200 mL/hr over 30 Minutes Intravenous Every 8 hours 09/28/24 1207 09/29/24 0622   09/27/24 1600  Ampicillin -Sulbactam (UNASYN ) 3 g in sodium chloride  0.9 % 100 mL IVPB  Status:  Discontinued        3 g 200 mL/hr over 30 Minutes Intravenous Every 12  hours 09/27/24 1509 09/28/24 1207   09/27/24 1330  cefTRIAXone  (ROCEPHIN ) 2 g in sodium chloride  0.9 % 100 mL IVPB        2 g 200 mL/hr over 30 Minutes Intravenous  Once 09/27/24 1323 09/27/24 1411        MEDICATIONS: Scheduled Meds:  apixaban   2.5 mg Oral BID   cyanocobalamin   1,000 mcg Intramuscular Weekly   diltiazem   90 mg Oral Q8H   insulin  aspart  0-15 Units Subcutaneous TID WC   insulin  aspart  0-5 Units Subcutaneous QHS   insulin  aspart  4 Units Subcutaneous TID WC   insulin  glargine  30 Units Subcutaneous Daily   isosorbide  mononitrate  30 mg Oral Daily   metroNIDAZOLE   500 mg Oral Q12H   mirabegron  ER  50 mg Oral QHS   pantoprazole   40 mg Oral Daily   QUEtiapine   25 mg Oral QHS   tamsulosin   0.4 mg Oral Daily   Continuous Infusions:  cefTRIAXone  (ROCEPHIN )  IV 2 g (10/01/24 1300)   PRN Meds:.acetaminophen  **OR** acetaminophen , albuterol , bisacodyl , diltiazem , hydrALAZINE , nitroGLYCERIN    I have personally  reviewed following labs and imaging studies  LABORATORY DATA: CBC: Recent Labs  Lab 09/27/24 1035 09/27/24 1100 09/28/24 0511 09/30/24 0357 10/01/24 0339  WBC 18.1*  --  14.4* 14.0* 13.2*  NEUTROABS 15.4*  --  11.0*  --   --   HGB 10.4* 10.5* 9.6* 9.7* 9.6*  HCT 32.8* 31.0* 30.3* 29.5* 29.3*  MCV 86.3  --  85.4 83.3 83.2  PLT 200  --  178 241 257    Basic Metabolic Panel: Recent Labs  Lab 09/28/24 0511 09/29/24 0212 09/30/24 0357 10/01/24 0339 10/02/24 0726  NA 146* 135 135 139 142  K 3.0* 4.7 4.1 4.0 3.7  CL 118* 102 104 107 110  CO2 19* 20* 20* 19* 20*  GLUCOSE 147* 302* 334* 197* 101*  BUN 33* 48* 67* 78* 70*  CREATININE 1.86* 2.73* 2.93* 3.21* 2.82*  CALCIUM  5.9* 8.1* 7.8* 8.0* 8.0*  MG 1.6*  --   --   --   --     GFR: Estimated Creatinine Clearance: 21 mL/min (A) (by C-G formula based on SCr of 2.82 mg/dL (H)).  Liver Function Tests: Recent Labs  Lab 09/27/24 1035 09/28/24 0511  AST 17 16  ALT 8 7  ALKPHOS 105 73  BILITOT  1.1 0.6  PROT 5.7* 4.3*  ALBUMIN  3.1* 2.4*   Recent Labs  Lab 09/27/24 1035  LIPASE 11   No results for input(s): AMMONIA in the last 168 hours.  Coagulation Profile: Recent Labs  Lab 09/27/24 1035  INR 2.2*    Cardiac Enzymes: No results for input(s): CKTOTAL, CKMB, CKMBINDEX, TROPONINI in the last 168 hours.  BNP (last 3 results) Recent Labs    09/27/24 1035  PROBNP 5,797.0*    Lipid Profile: No results for input(s): CHOL, HDL, LDLCALC, TRIG, CHOLHDL, LDLDIRECT in the last 72 hours.  Thyroid  Function Tests: No results for input(s): TSH, T4TOTAL, FREET4, T3FREE, THYROIDAB in the last 72 hours.   Anemia Panel: No results for input(s): VITAMINB12, FOLATE, FERRITIN, TIBC, IRON, RETICCTPCT in the last 72 hours.  Urine analysis:    Component Value Date/Time   COLORURINE YELLOW 09/27/2024 1544   APPEARANCEUR CLOUDY (A) 09/27/2024 1544   LABSPEC 1.012 09/27/2024 1544   PHURINE 5.0 09/27/2024 1544   GLUCOSEU >=500 (A) 09/27/2024 1544   HGBUR SMALL (A) 09/27/2024 1544   BILIRUBINUR NEGATIVE 09/27/2024 1544   KETONESUR NEGATIVE 09/27/2024 1544   PROTEINUR 100 (A) 09/27/2024 1544   UROBILINOGEN 0.2 10/02/2009 1604   NITRITE NEGATIVE 09/27/2024 1544   LEUKOCYTESUR LARGE (A) 09/27/2024 1544    Sepsis Labs: Lactic Acid, Venous    Component Value Date/Time   LATICACIDVEN 1.6 09/27/2024 1048    MICROBIOLOGY: Recent Results (from the past 240 hours)  Resp panel by RT-PCR (RSV, Flu A&B, Covid) Anterior Nasal Swab     Status: None   Collection Time: 09/27/24 12:15 PM   Specimen: Anterior Nasal Swab  Result Value Ref Range Status   SARS Coronavirus 2 by RT PCR NEGATIVE NEGATIVE Final   Influenza A by PCR NEGATIVE NEGATIVE Final   Influenza B by PCR NEGATIVE NEGATIVE Final    Comment: (NOTE) The Xpert Xpress SARS-CoV-2/FLU/RSV plus assay is intended as an aid in the diagnosis of influenza from Nasopharyngeal swab  specimens and should not be used as a sole basis for treatment. Nasal washings and aspirates are unacceptable for Xpert Xpress SARS-CoV-2/FLU/RSV testing.  Fact Sheet for Patients: bloggercourse.com  Fact Sheet for Healthcare Providers: seriousbroker.it  This test is not  yet approved or cleared by the [place]  FDA and has been authorized for detection and/or diagnosis of SARS-CoV-2 by FDA under an Emergency Use Authorization (EUA). This EUA will remain in effect (meaning this test can be used) for the duration of the COVID-19 declaration under Section 564(b)(1) of the Act, 21 U.S.C. section 360bbb-3(b)(1), unless the authorization is terminated or revoked.     Resp Syncytial Virus by PCR NEGATIVE NEGATIVE Final    Comment: (NOTE) Fact Sheet for Patients: bloggercourse.com  Fact Sheet for Healthcare Providers: seriousbroker.it  This test is not yet approved or cleared by the [place]  FDA and has been authorized for detection and/or diagnosis of SARS-CoV-2 by FDA under an Emergency Use Authorization (EUA). This EUA will remain in effect (meaning this test can be used) for the duration of the COVID-19 declaration under Section 564(b)(1) of the Act, 21 U.S.C. section 360bbb-3(b)(1), unless the authorization is terminated or revoked.  Performed at [facility], [address], [place], [name] [address]   Blood culture (routine x 2)     Status: None   Collection Time: 09/27/24  1:24 PM   Specimen: BLOOD  Result Value Ref Range Status   Specimen Description BLOOD RIGHT ANTECUBITAL  Final   Special Requests   Final    BOTTLES DRAWN AEROBIC AND ANAEROBIC Blood Culture adequate volume   Culture   Final    NO GROWTH 5 DAYS Performed at [facility], [address], [place], [name] [address]    Report Status 10/02/2024 FINAL  Final  Blood culture (routine x  2)     Status: None   Collection Time: 09/27/24  1:29 PM   Specimen: BLOOD RIGHT HAND  Result Value Ref Range Status   Specimen Description BLOOD RIGHT HAND  Final   Special Requests   Final    BOTTLES DRAWN AEROBIC AND ANAEROBIC Blood Culture adequate volume   Culture   Final    NO GROWTH 5 DAYS Performed at [facility], [address], [place], [name] [address]    Report Status 10/02/2024 FINAL  Final  MRSA Next Gen by PCR, Nasal     Status: None   Collection Time: 09/27/24  2:58 PM   Specimen: Nasal Mucosa; Nasal Swab  Result Value Ref Range Status   MRSA by PCR Next Gen NOT DETECTED NOT DETECTED Final    Comment: (NOTE) The GeneXpert MRSA Assay (FDA approved for NASAL specimens only), is one component of a comprehensive MRSA colonization surveillance program. It is not intended to diagnose MRSA infection nor to guide or monitor treatment for MRSA infections. Test performance is not FDA approved in patients less than 90 years old. Performed at [facility], [address], [place], [name] [address]   Respiratory (~20 pathogens) panel by PCR     Status: None   Collection Time: 09/27/24  3:20 PM   Specimen: Nasopharyngeal Swab; Respiratory  Result Value Ref Range Status   Adenovirus NOT DETECTED NOT DETECTED Final   Coronavirus 229E NOT DETECTED NOT DETECTED Final    Comment: (NOTE) The Coronavirus on the Respiratory Panel, DOES NOT test for the novel  Coronavirus (2019 nCoV)    Coronavirus HKU1 NOT DETECTED NOT DETECTED Final   Coronavirus NL63 NOT DETECTED NOT DETECTED Final   Coronavirus OC43 NOT DETECTED NOT DETECTED Final   Metapneumovirus NOT DETECTED NOT DETECTED Final   Rhinovirus / Enterovirus NOT DETECTED NOT DETECTED Final   Influenza A  NOT DETECTED NOT DETECTED Final   Influenza B NOT DETECTED NOT DETECTED Final   Parainfluenza Virus 1 NOT DETECTED NOT DETECTED Final   Parainfluenza Virus 2 NOT DETECTED NOT DETECTED Final   Parainfluenza Virus  3 NOT DETECTED NOT DETECTED Final   Parainfluenza Virus 4 NOT DETECTED NOT DETECTED Final   Respiratory Syncytial Virus NOT DETECTED NOT DETECTED Final   Bordetella pertussis NOT DETECTED NOT DETECTED Final   Bordetella Parapertussis NOT DETECTED NOT DETECTED Final   Chlamydophila pneumoniae NOT DETECTED NOT DETECTED Final   Mycoplasma pneumoniae NOT DETECTED NOT DETECTED Final    Comment: Performed at [facility], [address] [place], [name] [address]    RADIOLOGY STUDIES/RESULTS: US  RENAL Result Date: 10/01/2024 CLINICAL DATA:  AKI. EXAM: RENAL / URINARY TRACT ULTRASOUND COMPLETE COMPARISON:  09/27/2024. FINDINGS: Right Kidney: Renal measurements: 9.4 x 5.8 x 4.5 cm = volume: 130.5 mL. Echogenicity within normal limits. A cyst is present in the lower pole measuring 7.3 x 5.6 x 7.0 mm. No hydronephrosis. Left Kidney: Renal measurements: 7.0 x 2.3 x 3.7 cm = volume: 31.8 mL. Left kidney is located in the pelvis. Echogenicity within normal limits. No mass or hydronephrosis visualized. Bladder: Appears normal for degree of bladder distention. Other: Prostate gland is enlarged measuring 7.3 x 3.0 x 3.4 cm and protrudes into the base of the urinary bladder. Examination is limited due to patient's body habitus. IMPRESSION: 1. No hydronephrosis bilaterally. 2. Right renal cyst. 3. 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 4. Enlarged prostate gland. Electronically Signed   By: Leita Birmingham M.D.   On: 10/01/2024 17:33      LOS: 5 days   Donalda Applebaum, MD  [facility]    To contact the attending provider between 7A-7P or the covering provider during after hours 7P-7A, please log into the web site [email or web address] and access using universal [place] password for that web site. If you do not have the password, please call the hospital operator.  10/02/2024, 10:09 AM    "

## 2024-10-02 NOTE — Progress Notes (Signed)
 NIF -35 FVC 2.0L  With good pt efforts

## 2024-10-02 NOTE — Plan of Care (Signed)
   Problem: Education: Goal: Ability to describe self-care measures that may prevent or decrease complications (Diabetes Survival Skills Education) will improve Outcome: Progressing Goal: Individualized Educational Video(s) Outcome: Progressing   Problem: Coping: Goal: Ability to adjust to condition or change in health will improve Outcome: Progressing

## 2024-10-02 NOTE — Progress Notes (Signed)
 BG of 63 rechecked by RN and found to be 80. No s/sx of hypoglycemia. 35 gm of carb liquid consumed.

## 2024-10-02 NOTE — Progress Notes (Addendum)
 RT note. Nif -40 VC 1.46L Patient able to tolerate nif with good patient effort. Patient unable to complete VC, patient unable to continue blowing into FVC.  Patient did achieve 1.46L, Patient was starting to get agitated about test after 3rd try of trying to reinforce how the test is done properly.  RT will continue to monitor.

## 2024-10-02 NOTE — Progress Notes (Signed)
" ° °  Palliative Medicine Inpatient Follow Up Note   HPI: Fernando Hess is an 79 year old male with a past medical history significant for paroxysmal atrial fibrillation for which he is on Eliquis , coronary artery disease, diastolic heart failure, stage IV chronic kidney disease, sleep apnea, dementia, dyslipidemia, hypertension, type 2 diabetes mellitus, GERD, and right bundle branch block. Fernando Hess was admitted to the hospital on 13 January in the setting of weakness. Since hospitalization Fernando Hess has been identified to have hypoxemic respiratory failure thought to be related to aspiration pneumonia.   MBS from 1/15  passed with the recommendation(s) of regular diet with thin liquids; aspiration precautions.  The palliative care team has been asked to support additional goals of care conversations.   Today's Discussion 10/02/2024  I reviewed the chart notes including nursing notes from Dr. Raenelle, PT - [facility], RT - [place] D[place], progress notes from today. I also reviewed vital signs - HR in the 50's at times, on RA this morning, nursing flowsheets - able to eat and drink fairly well, medication administrations record, labs - BMP from today - Cr 2.82, and imaging Renal US  from yesterday which showed an enlarged prostate gland.    I met with Fernando Hess this morning. He is awake and in good spirits. He denies pain, shortness of breath or nausea. He asked is he can be repositioned. He was able to get OOB to the chair with 1 person assistance. He requested some what which I was able to provide to him. We reviewed the concerns associated with his hospital state and his aspiration. He provides some understanding. Created space and opportunity for patient to explore thoughts feelings and fears regarding current medical situation. He shares he is feeling better and has the hope(s) of getting home in the near future.   Questions and concerns addressed/Palliative Support Provided.   Objective Assessment: Vital  Signs Vitals:   10/02/24 0801 10/02/24 0900  BP: 123/64   Pulse: 71 61  Resp: 17 19  Temp: (!) 97.4 F (36.3 C)   SpO2: 93% 94%    Intake/Output Summary (Last 24 hours) at 10/02/2024 1128 Last data filed at 10/02/2024 0500 Gross per 24 hour  Intake --  Output 1350 ml  Net -1350 ml   Last Weight  Most recent update: 09/28/2024 12:06 PM    Weight  82 kg (180 lb 12.4 oz)            Gen: Elderly chronically ill-appearing Caucasian male HEENT: moist mucous membranes CV: Regular rate and rhythm  PULM: On RA breathing is even nonlabored ABD: soft/nontender  EXT: No edema  Neuro: Alert and oriented to person and place - does share understanding having a tough time breathing on admission  SUMMARY OF RECOMMENDATIONS   DNAR/DNI  Allow time for outcomes  Plan for transition to home with [facility] once medically optimized  The PMT will follow along peripherally  ______________________________________________________________________________________ Fernando Hess [facility] Team Team Cell Phone: [phone] Please utilize secure chat with additional questions, if there is no response within 30 minutes please call the above phone number  I personally spent a total of 25 minutes in the care of the patient today including preparing to see the patient, getting/reviewing separately obtained history, performing a medically appropriate exam/evaluation, counseling and educating, referring and communicating with other health care professionals, documenting clinical information in the EHR, and coordinating care.     "

## 2024-10-03 ENCOUNTER — Other Ambulatory Visit (HOSPITAL_COMMUNITY): Payer: Self-pay

## 2024-10-03 LAB — BASIC METABOLIC PANEL WITH GFR
Anion gap: 10 (ref 5–15)
BUN: 60 mg/dL — ABNORMAL HIGH (ref 8–23)
CO2: 23 mmol/L (ref 22–32)
Calcium: 8.2 mg/dL — ABNORMAL LOW (ref 8.9–10.3)
Chloride: 108 mmol/L (ref 98–111)
Creatinine, Ser: 2.5 mg/dL — ABNORMAL HIGH (ref 0.61–1.24)
GFR, Estimated: 25 mL/min — ABNORMAL LOW
Glucose, Bld: 97 mg/dL (ref 70–99)
Potassium: 3.9 mmol/L (ref 3.5–5.1)
Sodium: 141 mmol/L (ref 135–145)

## 2024-10-03 LAB — GLUCOSE, CAPILLARY: Glucose-Capillary: 150 mg/dL — ABNORMAL HIGH (ref 70–99)

## 2024-10-03 MED ORDER — ISOSORBIDE MONONITRATE ER 30 MG PO TB24
30.0000 mg | ORAL_TABLET | Freq: Every day | ORAL | 0 refills | Status: AC
  Filled 2024-10-03: qty 30, 30d supply, fill #0

## 2024-10-03 MED ORDER — DILTIAZEM HCL ER COATED BEADS 240 MG PO CP24
240.0000 mg | ORAL_CAPSULE | Freq: Every day | ORAL | 0 refills | Status: AC
  Filled 2024-10-03: qty 30, 30d supply, fill #0

## 2024-10-03 NOTE — Procedures (Signed)
 Pt did NIF/VC with decent effort despite being woke up.  NIF --35 VC 1.3L

## 2024-10-03 NOTE — Discharge Summary (Signed)
 "  PATIENT DETAILS Name: Fernando Hess Age: 79 y.o. Sex: male Date of Birth: [birth year] MRN: [id number]. Admitting Physician: Lavada MARLA Stank, MD ERE:Mndd, Carlin Redbird, MD  Admit Date: 09/27/2024 Discharge date: 10/03/2024  Recommendations for Outpatient Follow-up:  Follow up with PCP in 1-2 weeks Please obtain CMP/CBC in one week  Admitted From:  Home  Disposition: Home health   Discharge Condition: good  CODE STATUS:   Code Status: Limited: Do not attempt resuscitation (DNR) -DNR-LIMITED -Do Not Intubate/DNI    Diet recommendation:  Diet Order             Diet Carb Modified           Diet - low sodium heart healthy           Diet regular Room service appropriate? No; Fluid consistency: Thin  Diet effective now                    Brief Summary: 79 year old with history of myasthenia gravis (currently not on any treatment-per prior outpatient note-failed Mestinon/IVIG/prednisone )-HFpEF, A-fib, DM-2, HTN-who presented with shortness of breath after a choking episode/vomiting-found to have acute hypoxic respiratory failure secondary to aspiration pneumonia.   Significant events 1/13>> admit 1/14>> PCCM consult for worsening hypoxemia-placed on BiPAP-IV steroids-made DNR. 1/15>> liberated off BiPAP last night.  Stable on either room air or 2 L of oxygen.   Significant studies: 1/13>> CT head: No acute intracranial abnormality 1/13>> CT chest/abdomen/pelvis: Airspace opacity right upper lobe. 1/17>> renal ultrasound: No hydronephrosis   Significant microbiology data: 1/13>> COVID/influenza/RSV PCR: Negative 1/13>> respiratory virus panel: Negative 1/13>> blood culture: No growth   Procedures: None   Consults: PCCM  Brief Hospital Course: Acute hypoxic respiratory failure secondary to aspiration pneumonia Required BiPAP on 1/15-currently clinically improved clinically improved-on room air this morning. Overall improved-switched from Unasyn  to  Rocephin /Flagyl  (due to concern for AIN causing AKI)-Will stop all antibiotics 1/19-does not need antibiotics on discharge   AKI on CKD 4 Suspect AKI is hemodynamically mediated No hydronephrosis on renal ultrasound Creatinine seems to have plateaued and is now trending down-close to baseline-follows with Dr. Stank at [facility]. Repeat electrolytes at follow-up with PCP or nephrology.   History of seronegative myasthenia gravis Reviewed outpatient neurology note-March 2025-apparently has failed IVIG/Mestinon/prednisone -currently not on any medications. He seems to be stable currently-his hypoxia/respiratory issues are from his aspiration pneumonia.  His NIF/FVC is currently stable-ABG a few days ago did not show any hypercarbia.  He did well with his modified barium swallow and is tolerating swallowing/diet pretty well. Resume usual outpatient follow-up with primary neurologist.   Chronic HFpEF Relatively euvolemic-some trace edema in his legs Resume diuretics on discharge now that renal function is rapidly improving.   PAF Telemetry monitoring Cardizem /Eliquis    CAD Currently without any anginal symptoms Supportive care.   HTN BP relatively stable Cardizem /Imdur  Please follow-up with PCP for further optimization.   Hypoglycemia Mild Resolved with D5 infusion-CBC stable for the past several days.   DM-2 (A1c 8.8 on 10/15) with uncontrolled hyperglycemia secondary to steroids CBGs relatively stable Resume usual outpatient insulin  regimen-stable on Lantus  30 units daily while in the hospital.  Dementia Delirium precautions Aricept /Seroquel    OSA Per prior notes-uses 2 L of oxygen at night.   BPH Flomax  Per spouse-patient has a follow-up with [facility]  soon-for urodynamic testing.   Debility/deconditioning Mostly chronic issues-has had frequent falls while at home Evaluated by physical therapy-Recommendations are for home health-wife briefly contemplated  SNF but now wants to take him home again-Will order home health services.  Discharge Diagnoses:  Principal Problem:   Aspiration pneumonia ([name]) Active Problems:   History of seronegative myasthenia gravis ([name])   Paroxysmal atrial fibrillation ([name])   Essential hypertension   (HFpEF) heart failure with preserved ejection fraction ([name])   CKD stage 3b, GFR 30-44 ml/min ([name])   Insulin  dependent type 2 diabetes mellitus ([name])   S/P CABG x 5   CKD (chronic kidney disease), stage IV ([name])   Acute hypoxic respiratory failure ([name])   DNR (do not resuscitate)   DNI (do not intubate)   Sepsis due to pneumonia [facility])   Discharge Instructions:  Activity:  As tolerated with Full fall precautions use walker/cane & assistance as needed   Discharge Instructions     (HEART FAILURE PATIENTS) Call MD:  Anytime you have any of the following symptoms: 1) 3 pound weight gain in 24 hours or 5 pounds in 1 week 2) shortness of breath, with or without a dry hacking cough 3) swelling in the hands, feet or stomach 4) if you have to sleep on extra pillows at night in order to breathe.   Complete by: As directed    Call MD for:  difficulty breathing, headache or visual disturbances   Complete by: As directed    Diet - low sodium heart healthy   Complete by: As directed    Diet Carb Modified   Complete by: As directed    Discharge instructions   Complete by: As directed    Follow with Primary MD  Okey Carlin Redbird, MD in 1-2 weeks  Please get a complete blood count and chemistry panel checked by your Primary MD at your next visit, and again as instructed by your Primary MD.  Get Medicines reviewed and adjusted: Please take all your medications with you for your next visit with your Primary MD  Laboratory/radiological data: Please request your Primary MD to go over all hospital tests and procedure/radiological results at the follow up, please ask your Primary MD to get all Hospital records sent to  his/her office.  In some cases, they will be blood work, cultures and biopsy results pending at the time of your discharge. Please request that your primary care M.D. follows up on these results.  Also Note the following: If you experience worsening of your admission symptoms, develop shortness of breath, life threatening emergency, suicidal or homicidal thoughts you must seek medical attention immediately by calling 911 or calling your MD immediately  if symptoms less severe.  You must read complete instructions/literature along with all the possible adverse reactions/side effects for all the Medicines you take and that have been prescribed to you. Take any new Medicines after you have completely understood and accpet all the possible adverse reactions/side effects.   Do not drive when taking Pain medications or sleeping medications (Benzodaizepines)  Do not take more than prescribed Pain, Sleep and Anxiety Medications. It is not advisable to combine anxiety,sleep and pain medications without talking with your primary care practitioner  Special Instructions: If you have smoked or chewed Tobacco  in the last 2 yrs please stop smoking, stop any regular Alcohol   and or any Recreational drug use.  Wear Seat belts while driving.  Please note: You were cared for by a hospitalist during your hospital stay. Once you are discharged, your primary care physician will handle any further medical issues. Please note that NO REFILLS for any discharge medications will be  authorized once you are discharged, as it is imperative that you return to your primary care physician (or establish a relationship with a primary care physician if you do not have one) for your post hospital discharge needs so that they can reassess your need for medications and monitor your lab values.   Increase activity slowly   Complete by: As directed       Allergies as of 10/03/2024       Reactions   Cilostazol  Swelling, Other (See  Comments)   Edema   Magnesium -containing Compounds Other (See Comments)   Avoid magnesium  with hx myasthenia gravis (can worsen muscle weakness and trigger a severe exacerbation)   Donepezil  Other (See Comments)   seizure   Statins Other (See Comments)   Makes myasthenia gravis worse or flare up   Tirzepatide Other (See Comments)   Unknown    Dulaglutide Nausea And Vomiting, Other (See Comments)   TRULICITY   Levofloxacin Hives, Itching, Rash   Liraglutide Other (See Comments)   Severe fatigue & insomnia   Lisinopril Itching, Rash, Cough        Medication List     STOP taking these medications    amLODipine  5 MG tablet Commonly known as: NORVASC    losartan  25 MG tablet Commonly known as: COZAAR        TAKE these medications    apixaban  2.5 MG Tabs tablet Commonly known as: ELIQUIS  Take 1 tablet (2.5 mg total) by mouth 2 (two) times daily.   cyanocobalamin  1000 MCG/ML injection Commonly known as: VITAMIN B12 Inject 1,000 mcg into the muscle once a week. Inject on Saturday   diltiazem  240 MG 24 hr tablet Commonly known as: Cardizem  LA Take 1 tablet (240 mg total) by mouth daily.   FreeStyle Libre 3 Plus Sensor Misc CHANGE EVERY 15 DAYS AS RECOMMENDED BY THE MANUFACTURER 90 DAYS   FreeStyle Libre 3 Reader Espiridion USE TO TEST BLOOD SUGAR   furosemide  40 MG tablet Commonly known as: LASIX  Take 1 tablet (40 mg total) by mouth 3 (three) times a week. Take one tablet (40mg ) by mouth three times weekly on Monday, Wednesday, and Friday. What changed: additional instructions   GUAIFENESIN -CODEINE  PO Take 1 Dose by mouth as needed.   hydrALAZINE  25 MG tablet Commonly known as: APRESOLINE  Take 1 tablet (25 mg total) by mouth daily as needed (Hypertension). Take one tablet (25mg ) by mouth daily as needed if SBP is >160   insulin  lispro 100 UNIT/ML KwikPen Commonly known as: HUMALOG  Inject 8 Units into the skin 3 (three) times daily before meals.   isosorbide   mononitrate 30 MG 24 hr tablet Commonly known as: IMDUR  Take 1 tablet (30 mg total) by mouth daily.   lamoTRIgine  25 MG tablet Commonly known as: LAMICTAL  Take 25 mg by mouth 2 (two) times daily.   Lantus  SoloStar 100 UNIT/ML Solostar Pen Generic drug: insulin  glargine Inject 36 Units into the skin daily before breakfast.   latanoprost  0.005 % ophthalmic solution Commonly known as: XALATAN  Place 1 drop into both eyes at bedtime.   Myrbetriq  50 MG Tb24 tablet Generic drug: mirabegron  ER Take 50 mg by mouth at bedtime.   nitroGLYCERIN  0.4 MG SL tablet Commonly known as: Nitrostat  Place 1 tablet (0.4 mg total) under the tongue every 5 (five) minutes as needed for chest pain.   ondansetron  4 MG disintegrating tablet Commonly known as: ZOFRAN -ODT Take 1 tablet (4 mg total) by mouth every 8 (eight) hours as needed.   pantoprazole  40  MG tablet Commonly known as: PROTONIX  TAKE 1 TABLET BY MOUTH EVERY DAY   QUEtiapine  25 MG tablet Commonly known as: SEROQUEL  Take 1 tablet (25 mg total) by mouth at bedtime.   Repatha  SureClick 140 MG/ML Soaj Generic drug: Evolocumab  Inject 140 mg into the skin every 14 (fourteen) days.   tamsulosin  0.4 MG Caps capsule Commonly known as: FLOMAX  Take 0.4 mg by mouth daily.   Tradjenta  5 MG Tabs tablet Generic drug: linagliptin  Take 5 mg by mouth daily.        Follow-up Information     Okey Carlin Redbird, MD. Schedule an appointment as soon as possible for a visit in 1 week(s).   Specialty: Family Medicine Contact information: [address] [name] [address] [phone]                Allergies[1]   Other Procedures/Studies: US  RENAL Result Date: 10/01/2024 CLINICAL DATA:  AKI. EXAM: RENAL / URINARY TRACT ULTRASOUND COMPLETE COMPARISON:  09/27/2024. FINDINGS: Right Kidney: Renal measurements: 9.4 x 5.8 x 4.5 cm = volume: 130.5 mL. Echogenicity within normal limits. A cyst is present in the lower pole measuring  7.3 x 5.6 x 7.0 mm. No hydronephrosis. Left Kidney: Renal measurements: 7.0 x 2.3 x 3.7 cm = volume: 31.8 mL. Left kidney is located in the pelvis. Echogenicity within normal limits. No mass or hydronephrosis visualized. Bladder: Appears normal for degree of bladder distention. Other: Prostate gland is enlarged measuring 7.3 x 3.0 x 3.4 cm and protrudes into the base of the urinary bladder. Examination is limited due to patient's body habitus. IMPRESSION: 1. No hydronephrosis bilaterally. 2. Right renal cyst. 3. 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 4. Enlarged prostate gland. Electronically Signed   By: Leita Birmingham M.D.   On: 10/01/2024 17:33   DG Swallowing Func-Speech Pathology Result Date: 09/29/2024 Table formatting from the original result was not included. Modified Barium Swallow Study Patient Details Name: Fernando Hess MRN: [id number] Date of Birth: 12/23/1945 Today's Date: 09/29/2024 HPI/PMH: HPI: Seldon Barrell is an 79 y.o. male who presented from home to the hospital on 09/28/23 for falls and weakness. He was admitted with acute respiratory failure with hypoxia secondary to aspiration PNA. CT chest showed Airspace opacity is noted posteriorly in right upper lobe concerning for pneumonia. Smaller opacity is noted in left upper lobe as well. PMH: dementia, CVA, myasthenia gravis, DM, CAD s/p CABG, chronic HfpEF, CKD, depression, anxiety, OSA on CPAP, a-fib. Clinical Impression: Pt has oral and pharyngeal deficits but still exhibits fairly functional swallowing in terms of efficiency and safety. Recommend resuming regular diet and thin liquids with use of aspiration precautions and taking breaks PRN for respiratory status. SLP will f/u at least briefly given respiratory status as well as clinical signs noted during initial eval. Pt has good control when prompted to orally hold boluses, but when swallowing more spontaneously, he does have some extra repetitive movements before initiating swift  posterior transit. During these extra movements there are moments of premature spillage. He also has reduced velopharyngeal closure, base of tongue retraction, and pharyngeal squeeze, but he clears his pharynx well. Vallecular residue is noted intermittently, but when there, he performs a second swallow that clears it. Even across challenging, and moments in which he sounded more short of breath, he had no aspiration or penetration. Note that he also did not exhibit signs of dysphagia that were noted clinically (throat clear only x1, no wet vocal quality observed today). Factors that may  increase risk of adverse event in presence of aspiration Noe & Lianne 2021): Factors that may increase risk of adverse event in presence of aspiration Noe & Lianne 2021): Respiratory or GI disease Recommendations/Plan: Swallowing Evaluation Recommendations Swallowing Evaluation Recommendations Recommendations: PO diet PO Diet Recommendation: Regular; Thin liquids (Level 0) Liquid Administration via: Cup; Straw Medication Administration: Whole meds with liquid Supervision: Staff to assist with self-feeding Swallowing strategies  : Slow rate; Small bites/sips (take breaks PRN for respiratory status) Postural changes: Position pt fully upright for meals Oral care recommendations: Oral care BID (2x/day) Treatment Plan Treatment Plan Treatment recommendations: Therapy as outlined in treatment plan below Follow-up recommendations: No SLP follow up Functional status assessment: Patient has had a recent decline in their functional status and demonstrates the ability to make significant improvements in function in a reasonable and predictable amount of time. Treatment frequency: Min 2x/week Treatment duration: 1 week Interventions: Aspiration precaution training; Compensatory techniques; Patient/family education; Diet toleration management by SLP Recommendations Recommendations for follow up therapy are one component of a  multi-disciplinary discharge planning process, led by the attending physician.  Recommendations may be updated based on patient status, additional functional criteria and insurance authorization. Assessment: Orofacial Exam: Orofacial Exam Oral Cavity - Dentition: Adequate natural dentition Anatomy: No data recorded Boluses Administered: Boluses Administered Boluses Administered: Thin liquids (Level 0); Mildly thick liquids (Level 2, nectar thick); Moderately thick liquids (Level 3, honey thick); Puree; Solid  Oral Impairment Domain: Oral Impairment Domain Lip Closure: No labial escape Tongue control during bolus hold: Cohesive bolus between tongue to palatal seal Bolus preparation/mastication: Timely and efficient chewing and mashing Bolus transport/lingual motion: Brisk tongue motion Oral residue: Trace residue lining oral structures Location of oral residue : Floor of mouth; Tongue Initiation of pharyngeal swallow : Pyriform sinuses  Pharyngeal Impairment Domain: Pharyngeal Impairment Domain Soft palate elevation: Trace column of contrast or air between SP and PW Laryngeal elevation: Complete superior movement of thyroid  cartilage with complete approximation of arytenoids to epiglottic petiole Anterior hyoid excursion: Partial anterior movement Epiglottic movement: Complete inversion Laryngeal vestibule closure: Complete, no air/contrast in laryngeal vestibule Pharyngeal stripping wave : Present - diminished Pharyngeal contraction (A/P view only): N/A Pharyngoesophageal segment opening: Partial distention/partial duration, partial obstruction of flow Tongue base retraction: Narrow column of contrast or air between tongue base and PPW Pharyngeal residue: Collection of residue within or on pharyngeal structures Location of pharyngeal residue: Valleculae  Esophageal Impairment Domain: Esophageal Impairment Domain Esophageal clearance upright position: Complete clearance, esophageal coating Pill: Pill Consistency  administered: Thin liquids (Level 0) Thin liquids (Level 0): Impaired (see clinical impressions) (pt not able to clear the barium tablet from his oral cavity, chewed it before swallowing but denies trouble swallowing any of his usual pills) Penetration/Aspiration Scale Score: Penetration/Aspiration Scale Score 1.  Material does not enter airway: Thin liquids (Level 0); Mildly thick liquids (Level 2, nectar thick); Moderately thick liquids (Level 3, honey thick); Puree; Solid; Pill Compensatory Strategies: No data recorded  General Information: Caregiver present: No  Diet Prior to this Study: NPO   Temperature : Normal   Respiratory Status: WFL   Supplemental O2: Nasal cannula   History of Recent Intubation: No  Behavior/Cognition: Cooperative; Pleasant mood; Alert Self-Feeding Abilities: Able to self-feed Baseline vocal quality/speech: Normal No data recorded Volitional Swallow: Able to elicit Exam Limitations: No limitations Goal Planning: Prognosis for improved oropharyngeal function: Good No data recorded No data recorded Patient/Family Stated Goal: no family present, patient eager to return home Consulted and  agree with results and recommendations: Patient; Physician Pain: Pain Assessment Pain Assessment: Faces Faces Pain Scale: 0 End of Session: Start Time:SLP Start Time (ACUTE ONLY): 0908 Stop Time: SLP Stop Time (ACUTE ONLY): 0929 Time Calculation:SLP Time Calculation (min) (ACUTE ONLY): 21 min Charges: SLP Evaluations $ SLP Speech Visit: 1 Visit SLP Evaluations $BSS Swallow: 1 Procedure $MBS Swallow: 1 Procedure SLP visit diagnosis: SLP Visit Diagnosis: Dysphagia, oropharyngeal phase (R13.12) Past Medical History: Past Medical History: Diagnosis Date  Acute renal failure (ARF) 09/24/2015  Acute stress disorder 11/13/2021  Atrial premature complexes   CAD (coronary artery disease) of artery bypass graft   Early occlusion of saphenous vein graft to intermediate and marginal branch in February 2007 following  bypass grafting   CAD (coronary artery disease), native coronary artery 2017  hx NSTEMI 09-24-2015  s/p  CABG x5 on 10-02-2015;  post op STEMI inferolateral wall,  SVG OM1 and SVG OM2 occluded, distal OM occlusion the calpruit, treated medically // Myoview  7/21: no ischemia, EF 65, low risk  CHF (congestive heart failure) ([name])   CKD (chronic kidney disease), stage III ([name])   Contusion of right knee 03/16/2020  Dyspnea   Elevated troponin   Erectile dysfunction   Esophageal reflux   History of atrial fibrillation   post op CABG 10-02-2015  History of kidney stones   History of non-ST elevation myocardial infarction (NSTEMI) 09/24/2015  s/p  CABG x5  History of ST elevation myocardial infarction (STEMI) 10/22/2015  inferior wall,  post op CABG 10-02-2015  Hyperlipidemia   Hypertension   Left ureteral stone   Mild atherosclerosis of both carotid arteries   Nephrolithiasis   per CT bilateral non-obstructive calculi  Nocturnal hypoxemia   OSA (obstructive sleep apnea)   Peripheral artery disease   LE Arterial US  01/2019: R PTA and ATA occluded; L ATA occluded  RBBB (right bundle branch block)   Renal atrophy, right   Sleep apnea   wears cpap   ST elevation myocardial infarction (STEMI) of inferior wall ([name]) 10/22/2015  Type 2 diabetes mellitus treated with insulin  ([name])   followed by pcp  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) 03/01/2008  Wears glasses  Past Surgical History: Past Surgical History: Procedure Laterality Date  APPENDECTOMY  1965  CARDIAC CATHETERIZATION N/A 09/26/2015  Procedure: Left Heart Cath and Coronary Angiography;  Surgeon: Debby DELENA Sor, MD;  Location: [facility] INVASIVE CV LAB;  Service: Cardiovascular;  Laterality: N/A;  CARDIAC CATHETERIZATION N/A 10/22/2015  Procedure: Left Heart Cath and Coronary Angiography;  Surgeon: Ozell Fell, MD;  Location: [facility] INVASIVE CV LAB;  Service: Cardiovascular;  Laterality: N/A;  CATARACT  EXTRACTION W/ INTRAOCULAR LENS  IMPLANT, BILATERAL  2017  COLONOSCOPY    CORONARY ARTERY BYPASS GRAFT N/A 10/02/2015  Procedure: CORONARY ARTERY BYPASS GRAFTING (CABG) X5 LIMA-LAD; SVG-DIAG; SVG-OM; SVG-PD; SVG-RAMUS TRANSESOPHAGEAL ECHOCARDIOGRAM (TEE) ENDOSCOPIC GREATER SAPHENOUS VEIN  HARVEST BILAT LE;  Surgeon: Maude Fleeta Ochoa, MD;  Location: [facility] OR;  Service: Open Heart Surgery;  Laterality: N/A;  CYSTOSCOPY/URETEROSCOPY/HOLMIUM LASER/STENT PLACEMENT Left 08/10/2018  Procedure: CYSTOSCOPY/URETEROSCOPY/HOLMIUM LASER/STENT PLACEMENT;  Surgeon: Nieves Cough, MD;  Location: [facility];  Service: Urology;  Laterality: Left;  CYSTOSCOPY/URETEROSCOPY/HOLMIUM LASER/STENT PLACEMENT Left 09/10/2018  Procedure: CYSTOSCOPY/URETEROSCOPY/HOLMIUM LASER/STENT EXCHANGE;  Surgeon: Nieves Cough, MD;  Location: [facility] ORS;  Service: Urology;  Laterality: Left;  HIP ARTHROPLASTY Right 12/26/2022  Procedure: RIGHT HEMI HIP ARTHROPLASTY;  Surgeon: Edna Toribio DELENA, MD;  Location: [facility] OR;  Service:  Orthopedics;  Laterality: Right;  LEFT HEART CATHETERIZATION WITH CORONARY ANGIOGRAM N/A 04/13/2014  Procedure: LEFT HEART CATHETERIZATION WITH CORONARY ANGIOGRAM;  Surgeon: Elsie GORMAN Somerset, MD;  Location: [facility] CATH LAB;  Service: Cardiovascular;  Laterality: N/A;  LEG SURGERY Right age 63  closed reduction leg fracture  NASAL SEPTOPLASTY W/ TURBINOPLASTY Bilateral 08/30/2021  Procedure: NASAL SEPTOPLASTY WITH BILATERAL INFERIOR TURBINATE REDUCTION;  Surgeon: Mable Lenis, MD;  Location: [facility] OR;  Service: ENT;  Laterality: Bilateral;  POLYPECTOMY    RIGHT HEART CATH N/A 06/06/2021  Procedure: RIGHT HEART CATH;  Surgeon: Cherrie Toribio SAUNDERS, MD;  Location: [facility] INVASIVE CV LAB;  Service: Cardiovascular;  Laterality: N/A;  TEE WITHOUT CARDIOVERSION N/A 10/02/2015  Procedure: TRANSESOPHAGEAL ECHOCARDIOGRAM (TEE);  Surgeon: Maude Fleeta Ochoa, MD;  Location[facility];  Service: Open Heart Surgery;  Laterality: N/A;  URETEROSCOPY  WITH HOLMIUM LASER LITHOTRIPSY Bilateral 2004;  2005  dr grapey  @[email or web address]   VASECTOMY   Leita SAILOR., M.A. CCC-SLP Acute Rehabilitation Services Office: [phone] Secure chat preferred 09/29/2024, 11:06 AM  DG Chest Port 1V same Day Result Date: 09/29/2024 EXAM: 1 VIEW(S) XRAY OF THE CHEST 09/29/2024 07:10:00 AM COMPARISON: 09/27/2024 CLINICAL HISTORY: SOB (shortness of breath) FINDINGS: LUNGS AND PLEURA: Low lung volumes. Increased diffuse interstitial markings. No focal pulmonary opacity. No pleural effusion. No pneumothorax. HEART AND MEDIASTINUM: CABG markers noted. Median sternotomy wires noted. No acute abnormality of the cardiac and mediastinal silhouettes. BONES AND SOFT TISSUES: No acute osseous abnormality. IMPRESSION: 1. Low lung volumes with increased diffuse interstitial markings. 2. Postsurgical changes of prior CABG/median sternotomy. Electronically signed by: Evalene Coho MD 09/29/2024 07:32 AM EST RP Workstation: [id number]   CT CHEST ABDOMEN PELVIS WO CONTRAST Result Date: 09/27/2024 CLINICAL DATA:  Weakness, dizziness, fall. EXAM: CT CHEST, ABDOMEN AND PELVIS WITHOUT CONTRAST TECHNIQUE: Multidetector CT imaging of the chest, abdomen and pelvis was performed following the standard protocol without IV contrast. RADIATION DOSE REDUCTION: This exam was performed according to the departmental dose-optimization program which includes automated exposure control, adjustment of the mA and/or kV according to patient size and/or use of iterative reconstruction technique. COMPARISON:  March 06, 2024. FINDINGS: CT CHEST FINDINGS Cardiovascular: Status post coronary artery bypass graft. Mild cardiomegaly. No pericardial effusion. No definite thoracic aortic aneurysm. Mediastinum/Nodes: No enlarged mediastinal, hilar, or axillary lymph nodes. Thyroid  gland, trachea, and esophagus demonstrate no significant findings. Lungs/Pleura: No pneumothorax is noted. Minimal right pleural effusion is noted. Airspace  opacity is noted posteriorly in right upper lobe concerning for possible pneumonia. Smaller opacity is also noted in left upper lobe concerning for possible pneumonia. Musculoskeletal: No chest wall mass or suspicious bone lesions identified. CT ABDOMEN PELVIS FINDINGS Hepatobiliary: No focal liver abnormality is seen. No gallstones, gallbladder wall thickening, or biliary dilatation. Pancreas: Unremarkable. No pancreatic ductal dilatation or surrounding inflammatory changes. Spleen: Normal in size without focal abnormality. Adrenals/Urinary Tract: Adrenal glands appear normal. Small nonobstructive right renal calculus is noted. 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 Left nephrolithiasis is noted as well. No definite hydronephrosis or renal obstruction is noted. Urinary bladder is unremarkable. Stomach/Bowel: Status post appendectomy. Wall thickening of proximal stomach is noted which may represent lack of distension, but gastritis cannot be excluded. There is no evidence of bowel obstruction. Mild to moderate amount of stool seen in the sigmoid colon and rectum. Vascular/Lymphatic: Aortic atherosclerosis. No enlarged abdominal or pelvic lymph nodes. Reproductive: Prostate is unremarkable. Other: No abdominal wall hernia or abnormality. No abdominopelvic ascites. Musculoskeletal: Status post right  total hip arthroplasty. No acute osseous abnormality is noted. IMPRESSION: 1. Airspace opacity is noted posteriorly in right upper lobe concerning for pneumonia. Smaller opacity is noted in left upper lobe as well. 2. Minimal right pleural effusion is noted. 3. Wall thickening of proximal stomach is noted which may represent lack of distension, but gastritis cannot be excluded. 4. Bilateral nonobstructive nephrolithiasis. 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 5. Aortic atherosclerosis. Aortic Atherosclerosis (ICD10-I70.0). Electronically Signed   By: Lynwood Landy Raddle M.D.   On:  09/27/2024 13:09   DG Chest 2 View Result Date: 09/27/2024 CLINICAL DATA:  Shortness of breath with recent fall. EXAM: CHEST - 2 VIEW COMPARISON:  06/30/2024 FINDINGS: Lungs are hypoinflated without focal airspace consolidation or effusion. Cardiomediastinal silhouette and remainder of the exam is unchanged. IMPRESSION: Hypoinflation without acute cardiopulmonary disease. Electronically Signed   By: Toribio Agreste M.D.   On: 09/27/2024 11:41   CT Head Wo Contrast Result Date: 09/27/2024 EXAM: CT HEAD WITHOUT CONTRAST 09/27/2024 10:39:00 AM TECHNIQUE: CT of the head was performed without the administration of intravenous contrast. Automated exposure control, iterative reconstruction, and/or weight based adjustment of the mA/kV was utilized to reduce the radiation dose to as low as reasonably achievable. COMPARISON: CT of the head dated 07/15/2023. CLINICAL HISTORY: The patient experienced minor head trauma and is aged 59 years or older. FINDINGS: BRAIN AND VENTRICLES: No acute hemorrhage. No evidence of acute infarct. Age-related atrophy. Mild periventricular white matter disease. Remote lacunar infarcts within right basal ganglia. No hydrocephalus. No extra-axial collection. No mass effect or midline shift. ORBITS: Status post bilateral lens replacement. SINUSES: Polypoid mucosal thickening within right maxillary sinus. SOFT TISSUES AND SKULL: No acute soft tissue abnormality. No skull fracture. Moderate vascular calcifications. IMPRESSION: 1. No acute intracranial abnormality related to the minor head trauma. 2. Age-related atrophy, mild periventricular white matter disease, and remote lacunar infarcts within the right basal ganglia. Electronically signed by: Evalene Coho MD MD 09/27/2024 10:58 AM EST RP Workstation: [id number]     TODAY-DAY OF DISCHARGE:  Subjective:   Fernando Hess today has no headache,no chest abdominal pain,no new weakness tingling or numbness, feels much better wants to go  home today.   Objective:   Blood pressure (!) 146/62, pulse (!) 57, temperature 98.1 F (36.7 C), temperature source Oral, resp. rate (!) 23, height 5' 6 (1.676 m), weight 82 kg, SpO2 95%.  Intake/Output Summary (Last 24 hours) at 10/03/2024 0915 Last data filed at 10/03/2024 0511 Gross per 24 hour  Intake 240 ml  Output 1000 ml  Net -760 ml   Filed Weights   09/28/24 1104  Weight: 82 kg    Exam: Awake Alert, Oriented *3, No new F.N deficits, Normal affect [place].AT,PERRAL Supple Neck,No JVD, No cervical lymphadenopathy appriciated.  Symmetrical Chest wall movement, Good air movement bilaterally, CTAB RRR,No Gallops,Rubs or new Murmurs, No Parasternal Heave +ve B.Sounds, Abd Soft, Non tender, No organomegaly appriciated, No rebound -guarding or rigidity. No Cyanosis, Clubbing or edema, No new Rash or bruise   PERTINENT RADIOLOGIC STUDIES: US  RENAL Result Date: 10/01/2024 CLINICAL DATA:  AKI. EXAM: RENAL / URINARY TRACT ULTRASOUND COMPLETE COMPARISON:  09/27/2024. FINDINGS: Right Kidney: Renal measurements: 9.4 x 5.8 x 4.5 cm = volume: 130.5 mL. Echogenicity within normal limits. A cyst is present in the lower pole measuring 7.3 x 5.6 x 7.0 mm. No hydronephrosis. Left Kidney: Renal measurements: 7.0 x 2.3 x 3.7 cm = volume: 31.8 mL. Left kidney is  located in the pelvis. Echogenicity within normal limits. No mass or hydronephrosis visualized. Bladder: Appears normal for degree of bladder distention. Other: Prostate gland is enlarged measuring 7.3 x 3.0 x 3.4 cm and protrudes into the base of the urinary bladder. Examination is limited due to patient's body habitus. IMPRESSION: 1. No hydronephrosis bilaterally. 2. Right renal cyst. 3. 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 4. Enlarged prostate gland. Electronically Signed   By: Leita Birmingham M.D.   On: 10/01/2024 17:33     PERTINENT LAB RESULTS: CBC: Recent Labs    10/01/24 0339  WBC 13.2*  HGB 9.6*  HCT 29.3*  PLT 257    CMET CMP     Component Value Date/Time   NA 141 10/03/2024 0221   NA 138 11/06/2021 1031   K 3.9 10/03/2024 0221   CL 108 10/03/2024 0221   CO2 23 10/03/2024 0221   GLUCOSE 97 10/03/2024 0221   BUN 60 (H) 10/03/2024 0221   BUN 34 (H) 11/06/2021 1031   CREATININE 2.50 (H) 10/03/2024 0221   CALCIUM  8.2 (L) 10/03/2024 0221   PROT 4.3 (L) 09/28/2024 0511   PROT 6.5 01/22/2022 0815   ALBUMIN  2.4 (L) 09/28/2024 0511   ALBUMIN  4.1 01/22/2022 0815   AST 16 09/28/2024 0511   ALT 7 09/28/2024 0511   ALKPHOS 73 09/28/2024 0511   BILITOT 0.6 09/28/2024 0511   BILITOT 0.5 01/22/2022 0815   EGFR 34 (L) 11/06/2021 1031   GFRNONAA 25 (L) 10/03/2024 0221    GFR Estimated Creatinine Clearance: 23.7 mL/min (A) (by C-G formula based on SCr of 2.5 mg/dL (H)). No results for input(s): LIPASE, AMYLASE in the last 72 hours. No results for input(s): CKTOTAL, CKMB, CKMBINDEX, TROPONINI in the last 72 hours. Invalid input(s): POCBNP No results for input(s): DDIMER in the last 72 hours. No results for input(s): HGBA1C in the last 72 hours. No results for input(s): CHOL, HDL, LDLCALC, TRIG, CHOLHDL, LDLDIRECT in the last 72 hours. No results for input(s): TSH, T4TOTAL, T3FREE, THYROIDAB in the last 72 hours.  Invalid input(s): FREET3 No results for input(s): VITAMINB12, FOLATE, FERRITIN, TIBC, IRON, RETICCTPCT in the last 72 hours. Coags: No results for input(s): INR in the last 72 hours.  Invalid input(s): PT Microbiology: Recent Results (from the past 240 hours)  Resp panel by RT-PCR (RSV, Flu A&B, Covid) Anterior Nasal Swab     Status: None   Collection Time: 09/27/24 12:15 PM   Specimen: Anterior Nasal Swab  Result Value Ref Range Status   SARS Coronavirus 2 by RT PCR NEGATIVE NEGATIVE Final   Influenza A by PCR NEGATIVE NEGATIVE Final   Influenza B by PCR NEGATIVE NEGATIVE Final    Comment: (NOTE) The Xpert Xpress  SARS-CoV-2/FLU/RSV plus assay is intended as an aid in the diagnosis of influenza from Nasopharyngeal swab specimens and should not be used as a sole basis for treatment. Nasal washings and aspirates are unacceptable for Xpert Xpress SARS-CoV-2/FLU/RSV testing.  Fact Sheet for Patients: bloggercourse.com  Fact Sheet for Healthcare Providers: seriousbroker.it  This test is not yet approved or cleared by the [place]  FDA and has been authorized for detection and/or diagnosis of SARS-CoV-2 by FDA under an Emergency Use Authorization (EUA). This EUA will remain in effect (meaning this test can be used) for the duration of the COVID-19 declaration under Section 564(b)(1) of the Act, 21 U.S.C. section 360bbb-3(b)(1), unless the authorization is terminated or revoked.     Resp Syncytial Virus by PCR NEGATIVE  NEGATIVE Final    Comment: (NOTE) Fact Sheet for Patients: bloggercourse.com  Fact Sheet for Healthcare Providers: seriousbroker.it  This test is not yet approved or cleared by the [place]  FDA and has been authorized for detection and/or diagnosis of SARS-CoV-2 by FDA under an Emergency Use Authorization (EUA). This EUA will remain in effect (meaning this test can be used) for the duration of the COVID-19 declaration under Section 564(b)(1) of the Act, 21 U.S.C. section 360bbb-3(b)(1), unless the authorization is terminated or revoked.  Performed at [facility], [address], [place], [name] [address]   Blood culture (routine x 2)     Status: None   Collection Time: 09/27/24  1:24 PM   Specimen: BLOOD  Result Value Ref Range Status   Specimen Description BLOOD RIGHT ANTECUBITAL  Final   Special Requests   Final    BOTTLES DRAWN AEROBIC AND ANAEROBIC Blood Culture adequate volume   Culture   Final    NO GROWTH 5 DAYS Performed at [facility],  [address] [place], [name] [address]    Report Status 10/02/2024 FINAL  Final  Blood culture (routine x 2)     Status: None   Collection Time: 09/27/24  1:29 PM   Specimen: BLOOD RIGHT HAND  Result Value Ref Range Status   Specimen Description BLOOD RIGHT HAND  Final   Special Requests   Final    BOTTLES DRAWN AEROBIC AND ANAEROBIC Blood Culture adequate volume   Culture   Final    NO GROWTH 5 DAYS Performed at [facility], [address], [place], [name] [address]    Report Status 10/02/2024 FINAL  Final  MRSA Next Gen by PCR, Nasal     Status: None   Collection Time: 09/27/24  2:58 PM   Specimen: Nasal Mucosa; Nasal Swab  Result Value Ref Range Status   MRSA by PCR Next Gen NOT DETECTED NOT DETECTED Final    Comment: (NOTE) The GeneXpert MRSA Assay (FDA approved for NASAL specimens only), is one component of a comprehensive MRSA colonization surveillance program. It is not intended to diagnose MRSA infection nor to guide or monitor treatment for MRSA infections. Test performance is not FDA approved in patients less than 18 years old. Performed at [facility], [address], [place], [name] [address]   Respiratory (~20 pathogens) panel by PCR     Status: None   Collection Time: 09/27/24  3:20 PM   Specimen: Nasopharyngeal Swab; Respiratory  Result Value Ref Range Status   Adenovirus NOT DETECTED NOT DETECTED Final   Coronavirus 229E NOT DETECTED NOT DETECTED Final    Comment: (NOTE) The Coronavirus on the Respiratory Panel, DOES NOT test for the novel  Coronavirus (2019 nCoV)    Coronavirus HKU1 NOT DETECTED NOT DETECTED Final   Coronavirus NL63 NOT DETECTED NOT DETECTED Final   Coronavirus OC43 NOT DETECTED NOT DETECTED Final   Metapneumovirus NOT DETECTED NOT DETECTED Final   Rhinovirus / Enterovirus NOT DETECTED NOT DETECTED Final   Influenza A NOT DETECTED NOT DETECTED Final   Influenza B NOT DETECTED NOT DETECTED Final   Parainfluenza Virus 1 NOT  DETECTED NOT DETECTED Final   Parainfluenza Virus 2 NOT DETECTED NOT DETECTED Final   Parainfluenza Virus 3 NOT DETECTED NOT DETECTED Final   Parainfluenza Virus 4 NOT DETECTED NOT DETECTED Final   Respiratory Syncytial Virus NOT DETECTED NOT DETECTED Final   Bordetella pertussis NOT DETECTED NOT DETECTED  Final   Bordetella Parapertussis NOT DETECTED NOT DETECTED Final   Chlamydophila pneumoniae NOT DETECTED NOT DETECTED Final   Mycoplasma pneumoniae NOT DETECTED NOT DETECTED Final    Comment: Performed at [facility], [address], [place], [name] [address]    FURTHER DISCHARGE INSTRUCTIONS:  Get Medicines reviewed and adjusted: Please take all your medications with you for your next visit with your Primary MD  Laboratory/radiological data: Please request your Primary MD to go over all hospital tests and procedure/radiological results at the follow up, please ask your Primary MD to get all Hospital records sent to his/her office.  In some cases, they will be blood work, cultures and biopsy results pending at the time of your discharge. Please request that your primary care M.D. goes through all the records of your hospital data and follows up on these results.  Also Note the following: If you experience worsening of your admission symptoms, develop shortness of breath, life threatening emergency, suicidal or homicidal thoughts you must seek medical attention immediately by calling 911 or calling your MD immediately  if symptoms less severe.  You must read complete instructions/literature along with all the possible adverse reactions/side effects for all the Medicines you take and that have been prescribed to you. Take any new Medicines after you have completely understood and accpet all the possible adverse reactions/side effects.   Do not drive when taking Pain medications or sleeping medications (Benzodaizepines)  Do not take more than prescribed Pain, Sleep and Anxiety  Medications. It is not advisable to combine anxiety,sleep and pain medications without talking with your primary care practitioner  Special Instructions: If you have smoked or chewed Tobacco  in the last 2 yrs please stop smoking, stop any regular Alcohol   and or any Recreational drug use.  Wear Seat belts while driving.  Please note: You were cared for by a hospitalist during your hospital stay. Once you are discharged, your primary care physician will handle any further medical issues. Please note that NO REFILLS for any discharge medications will be authorized once you are discharged, as it is imperative that you return to your primary care physician (or establish a relationship with a primary care physician if you do not have one) for your post hospital discharge needs so that they can reassess your need for medications and monitor your lab values.  Total Time spent coordinating discharge including counseling, education and face to face time equals greater than 30 minutes.  Signed: Donalda Applebaum 10/03/2024 9:15 AM      [1]  Allergies Allergen Reactions   Cilostazol  Swelling and Other (See Comments)    Edema    Magnesium -Containing Compounds Other (See Comments)    Avoid magnesium  with hx myasthenia gravis (can worsen muscle weakness and trigger a severe exacerbation)   Donepezil  Other (See Comments)    seizure   Statins Other (See Comments)    Makes myasthenia gravis worse or flare up   Tirzepatide Other (See Comments)    Unknown    Dulaglutide Nausea And Vomiting and Other (See Comments)    TRULICITY   Levofloxacin Hives, Itching and Rash   Liraglutide Other (See Comments)    Severe fatigue & insomnia   Lisinopril Itching, Rash and Cough   "

## 2024-10-03 NOTE — Plan of Care (Signed)
   Problem: Education: Goal: Ability to describe self-care measures that may prevent or decrease complications (Diabetes Survival Skills Education) will improve Outcome: Progressing Goal: Individualized Educational Video(s) Outcome: Progressing

## 2024-10-04 ENCOUNTER — Encounter: Payer: Self-pay | Admitting: Podiatry

## 2024-10-04 ENCOUNTER — Ambulatory Visit: Admitting: Podiatry

## 2024-10-04 DIAGNOSIS — M79674 Pain in right toe(s): Secondary | ICD-10-CM

## 2024-10-04 DIAGNOSIS — E1151 Type 2 diabetes mellitus with diabetic peripheral angiopathy without gangrene: Secondary | ICD-10-CM

## 2024-10-04 DIAGNOSIS — M79675 Pain in left toe(s): Secondary | ICD-10-CM | POA: Diagnosis not present

## 2024-10-04 DIAGNOSIS — B351 Tinea unguium: Secondary | ICD-10-CM | POA: Diagnosis not present

## 2024-10-10 ENCOUNTER — Emergency Department (HOSPITAL_COMMUNITY)
Admission: EM | Admit: 2024-10-10 | Discharge: 2024-10-10 | Disposition: A | Discharge status: Home / Self Care | Attending: Emergency Medicine | Admitting: Emergency Medicine

## 2024-10-10 ENCOUNTER — Encounter: Payer: Self-pay | Admitting: Podiatry

## 2024-10-10 ENCOUNTER — Other Ambulatory Visit: Payer: Self-pay

## 2024-10-10 ENCOUNTER — Emergency Department (HOSPITAL_COMMUNITY)

## 2024-10-10 DIAGNOSIS — I509 Heart failure, unspecified: Secondary | ICD-10-CM | POA: Diagnosis not present

## 2024-10-10 DIAGNOSIS — I2510 Atherosclerotic heart disease of native coronary artery without angina pectoris: Secondary | ICD-10-CM | POA: Insufficient documentation

## 2024-10-10 DIAGNOSIS — N189 Chronic kidney disease, unspecified: Secondary | ICD-10-CM | POA: Diagnosis not present

## 2024-10-10 DIAGNOSIS — S0990XA Unspecified injury of head, initial encounter: Secondary | ICD-10-CM | POA: Diagnosis not present

## 2024-10-10 DIAGNOSIS — W01198A Fall on same level from slipping, tripping and stumbling with subsequent striking against other object, initial encounter: Secondary | ICD-10-CM | POA: Insufficient documentation

## 2024-10-10 DIAGNOSIS — I6782 Cerebral ischemia: Secondary | ICD-10-CM | POA: Diagnosis not present

## 2024-10-10 DIAGNOSIS — I4891 Unspecified atrial fibrillation: Secondary | ICD-10-CM | POA: Insufficient documentation

## 2024-10-10 DIAGNOSIS — E1122 Type 2 diabetes mellitus with diabetic chronic kidney disease: Secondary | ICD-10-CM | POA: Diagnosis not present

## 2024-10-10 DIAGNOSIS — I130 Hypertensive heart and chronic kidney disease with heart failure and stage 1 through stage 4 chronic kidney disease, or unspecified chronic kidney disease: Secondary | ICD-10-CM | POA: Insufficient documentation

## 2024-10-10 DIAGNOSIS — Z7901 Long term (current) use of anticoagulants: Secondary | ICD-10-CM | POA: Insufficient documentation

## 2024-10-10 DIAGNOSIS — Z0489 Encounter for examination and observation for other specified reasons: Secondary | ICD-10-CM | POA: Diagnosis present

## 2024-10-10 DIAGNOSIS — W19XXXA Unspecified fall, initial encounter: Secondary | ICD-10-CM

## 2024-10-10 NOTE — Progress Notes (Signed)
 "  Subjective:  Patient ID: Fernando Hess, male    DOB: 06/07/1946,  MRN: [id number]  Fernando Hess presents to clinic today for at risk foot care. Pt has h/o NIDDM with PAD and painful mycotic toenails of both feet that are difficult to trim. Pain interferes with daily activities and wearing enclosed shoe gear comfortably. He is accompanied by his wife on today's visit. Chief Complaint  Patient presents with   RFC   New problem(s): None.   PCP is Okey Carlin Redbird, MD. ARNETTA 10/03/24.  Allergies[1]  Review of Systems: Negative except as noted in the HPI.  Objective: No changes noted in today's physical examination. There were no vitals filed for this visit. Fernando Hess is a pleasant 79 y.o. male in NAD. AAO x 3.  Vascular Examination: CFT <3 seconds b/l. DP/PT pulses faintly palpable b/l. Pedal edema absent. Skin temperature gradient warm to warm b/l. Digital hair absent. No pain with calf compression. No ischemia or gangrene. No cyanosis or clubbing noted b/l. Dependent edema noted b/l LE.   Neurological Examination: Sensation grossly intact b/l with 10 gram monofilament. Vibratory sensation decreased b/l.  Dermatological Examination: Pedal skin warm and supple b/l.   No open wounds. No interdigital macerations.  Toenails 1-5 b/l thick, discolored, elongated with subungual debris and pain on dorsal palpation.    No corns, calluses, nor porokeratotic lesions.  Musculoskeletal Examination: Muscle strength 5/5 to all lower extremity muscle groups bilaterally. HAV with bunion bilaterally and hammertoes 2-5 b/l. Utilizes wheelchair for mobility assistance.  Radiographs: None  Last A1c:      Latest Ref Rng & Units 06/29/2024    8:36 AM 02/10/2024    4:54 AM  Hemoglobin A1C  Hemoglobin-A1c 4.8 - 5.6 % 8.8  10.8    Assessment/Plan: 1. Pain due to onychomycosis of toenails of both feet   2. Type II diabetes mellitus with peripheral circulatory disorder [facility])     Consent  given for treatment. Patient examined. All patient's and/or POA's questions/concerns addressed on today's visit. Toenails 1-5 b/l debrided in length and girth without incident. Treatment was provided by assistant Andrez Manchester under my supervision. Continue foot and shoe inspections daily. Monitor blood glucose per PCP/Endocrinologist's recommendations. Continue soft, supportive shoe gear daily. Report any pedal injuries to medical professional. Call office if there are any questions/concerns.  Return in about 3 months (around 01/02/2025).  Delon LITTIE Merlin, DPM      [place] LOCATION: [address] [place], [name] [address]                   Office [phone]   [place] LOCATION: [address] [place], [name] [address] Office [phone]     [1]  Allergies Allergen Reactions   Cilostazol  Swelling and Other (See Comments)    Edema    Magnesium -Containing Compounds Other (See Comments)    Avoid magnesium  with hx myasthenia gravis (can worsen muscle weakness and trigger a severe exacerbation)   Donepezil  Other (See Comments)    seizure   Statins Other (See Comments)    Makes myasthenia gravis worse or flare up   Tirzepatide Other (See Comments)    Unknown    Dulaglutide  Nausea And Vomiting and Other (See Comments)    TRULICITY   Levofloxacin Hives, Itching and Rash   Liraglutide Other (See Comments)    Severe fatigue & insomnia   Lisinopril Itching, Rash and Cough   "

## 2024-10-10 NOTE — ED Provider Notes (Signed)
 " [place] [facility] DEPARTMENT AT [facility] Provider Note   CSN: [id number] Arrival date & time: 10/10/24  1709     Patient presents with: FOT  (no complaints)   Fernando Hess is a 79 y.o. male.   HPI     79 year old male with a history of coronary artery disease, atrial fibrillation on Eliquis , CVA, hyperlipidemia, hypertension, CHF, CKD, type 2 diabetes, history of myasthenia gravis with 3 send admission for acute hypoxic respiratory failure secondary to aspiration pneumonia and discharged on January 19 who presents with concern for fall.  Walking with walker, went to reach for water  and tipped over as he was reaching down Tipped over forward, hit front side, doesn't really think hit head. No neck pain, no back pain.  No other injuries from fall, no hip, leg or arm pain. Cough improved, not feeling short of breath, no nausea or vomiting NO increased weakness from myasthenia, no change in speech from baseline On home O2 since June 2L Home health services  Oriented to name, location, January 26 and able to describe recent events/hospitalization and prior hx CABG 2017, CVA and details of fall and home oxygen use.  Reports he last took his eliquis  this AM.  Estimates he was on the floor for 30 minutes, taht wife heard him and EMS came relatively quickly    Past Medical History:  Diagnosis Date   Acute renal failure (ARF) 09/24/2015   Acute stress disorder 11/13/2021   Atrial premature complexes    CAD (coronary artery disease) of artery bypass graft    Early occlusion of saphenous vein graft to intermediate and marginal branch in February 2007 following bypass grafting    CAD (coronary artery disease), native coronary artery 2017   hx NSTEMI 09-24-2015  s/p  CABG x5 on 10-02-2015;  post op STEMI inferolateral wall,  SVG OM1 and SVG OM2 occluded, distal OM occlusion the calpruit, treated medically // Myoview  7/21: no ischemia, EF 65, low risk   CHF (congestive heart  failure) ([name])    CKD (chronic kidney disease), stage III ([name])    Contusion of right knee 03/16/2020   Dyspnea    Elevated troponin    Erectile dysfunction    Esophageal reflux    History of atrial fibrillation    post op CABG 10-02-2015   History of kidney stones    History of non-ST elevation myocardial infarction (NSTEMI) 09/24/2015   s/p  CABG x5   History of ST elevation myocardial infarction (STEMI) 10/22/2015   inferior wall,  post op CABG 10-02-2015   Hyperlipidemia    Hypertension    Left ureteral stone    Mild atherosclerosis of both carotid arteries    Nephrolithiasis    per CT bilateral non-obstructive calculi   Nocturnal hypoxemia    OSA (obstructive sleep apnea)    Peripheral artery disease    LE Arterial US  01/2019: R PTA and ATA occluded; L ATA occluded   RBBB (right bundle branch block)    Renal atrophy, right    Sleep apnea    wears cpap    ST elevation myocardial infarction (STEMI) of inferior wall ([name]) 10/22/2015   Type 2 diabetes mellitus treated with insulin  ([name])    followed by pcp   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 Authorizing  Provider  apixaban  (ELIQUIS ) 2.5 MG TABS tablet Take 1 tablet (2.5 mg total) by mouth 2 (two) times daily. 07/01/24   Kathrin Mignon DASEN, MD  Continuous Glucose Receiver (FREESTYLE LIBRE 3 READER) DEVI USE TO TEST BLOOD SUGAR 03/07/24   [provider]  Continuous Glucose Sensor (FREESTYLE LIBRE 3 PLUS SENSOR) MISC CHANGE EVERY 15 DAYS AS RECOMMENDED BY THE MANUFACTURER 90 DAYS 03/02/24   [provider]  cyanocobalamin  (VITAMIN B12) 1000 MCG/ML injection Inject 1,000 mcg into the muscle once a week. Inject on Saturday    [provider]  diltiazem  (CARDIZEM  CD) 240 MG 24 hr capsule Take 1 capsule (240 mg total) by mouth  daily. 10/03/24   Ghimire, Donalda HERO, MD  Evolocumab  (REPATHA  SURECLICK) 140 MG/ML SOAJ Inject 140 mg into the skin every 14 (fourteen) days. 08/03/24   Lucien Orren SAILOR, [name]  furosemide  (LASIX ) 40 MG tablet Take 1 tablet (40 mg total) by mouth 3 (three) times a week. Take one tablet (40mg ) by mouth three times weekly on Monday, Wednesday, and Friday. Patient taking differently: Take 40 mg by mouth 3 (three) times a week. Take one tablet (40mg ) by mouth three times weekly on Monday, Wednesday, and Friday.occasionally daily 03/04/24   Patsy Lenis, MD  GUAIFENESIN -CODEINE  PO Take 1 Dose by mouth as needed.    [provider]  hydrALAZINE  (APRESOLINE ) 25 MG tablet Take 1 tablet (25 mg total) by mouth daily as needed (Hypertension). Take one tablet (25mg ) by mouth daily as needed if SBP is >160 03/04/24   Patsy Lenis, MD  insulin  lispro (HUMALOG ) 100 UNIT/ML KwikPen Inject 8 Units into the skin 3 (three) times daily before meals. 05/15/23   [provider]  isosorbide  mononitrate (IMDUR ) 30 MG 24 hr tablet Take 1 tablet (30 mg total) by mouth daily. 10/03/24   Ghimire, Donalda HERO, MD  lamoTRIgine  (LAMICTAL ) 25 MG tablet Take 25 mg by mouth 2 (two) times daily. 02/18/24   [provider]  LANTUS  SOLOSTAR 100 UNIT/ML Solostar Pen Inject 36 Units into the skin daily before breakfast. 02/19/23   [provider]  latanoprost  (XALATAN ) 0.005 % ophthalmic solution Place 1 drop into both eyes at bedtime.    [provider]  mirabegron  ER (MYRBETRIQ ) 50 MG TB24 tablet Take 50 mg by mouth at bedtime. 06/08/24   [provider]  nitroGLYCERIN  (NITROSTAT ) 0.4 MG SL tablet Place 1 tablet (0.4 mg total) under the tongue every 5 (five) minutes as needed for chest pain. 08/12/24 08/12/25  Meng, Hao, PA  ondansetron  (ZOFRAN -ODT) 4 MG disintegrating tablet Take 1 tablet (4 mg total) by mouth every 8 (eight) hours as needed. 03/06/24   Melvenia Motto, MD  pantoprazole  (PROTONIX ) 40  MG tablet TAKE 1 TABLET BY MOUTH EVERY DAY 11/25/19   Abran Norleen SAILOR, MD  QUEtiapine  (SEROQUEL ) 25 MG tablet Take 1 tablet (25 mg total) by mouth at bedtime. 07/01/24   Gonfa, Taye T, MD  tamsulosin  (FLOMAX ) 0.4 MG CAPS capsule Take 0.4 mg by mouth daily. 01/10/24   [provider]  TRADJENTA  5 MG TABS tablet Take 5 mg by mouth daily.    [provider]    Allergies: Cilostazol , Magnesium -containing compounds, Donepezil , Statins, Tirzepatide, Dulaglutide, Levofloxacin, Liraglutide, and Lisinopril    Review of Systems  Updated Vital Signs BP (!) 158/97   Pulse 67   Temp 98.7 F (37.1 C)   Resp 18   Ht 5' 6 (1.676 m)   Wt 86.2 kg  SpO2 97%   BMI 30.67 kg/m   Physical Exam Vitals and nursing note reviewed.  Constitutional:      General: He is not in acute distress.    Appearance: He is well-developed. He is not diaphoretic.  HENT:     Head: Normocephalic and atraumatic.  Eyes:     Conjunctiva/sclera: Conjunctivae normal.  Cardiovascular:     Rate and Rhythm: Normal rate and regular rhythm.     Heart sounds: Normal heart sounds. No murmur heard.    No friction rub. No gallop.  Pulmonary:     Effort: Pulmonary effort is normal. No respiratory distress.     Breath sounds: Normal breath sounds. No wheezing or rales.  Abdominal:     General: There is no distension.     Palpations: Abdomen is soft.     Tenderness: There is no abdominal tenderness. There is no guarding.  Musculoskeletal:        General: No tenderness (deneis extremity tenderness, tenderness to c/t/l spine).     Cervical back: Normal range of motion.  Skin:    General: Skin is warm and dry.  Neurological:     Mental Status: He is alert and oriented to person, place, and time.     Motor: No weakness.     (all labs ordered are listed, but only abnormal results are displayed) Labs Reviewed - No data to display  EKG: None  Radiology: CT Cervical Spine Wo Contrast Result Date:  10/10/2024 EXAM: CT CERVICAL SPINE WITHOUT CONTRAST 10/10/2024 06:12:00 PM TECHNIQUE: CT of the cervical spine was performed without the administration of intravenous contrast. Multiplanar reformatted images are provided for review. Automated exposure control, iterative reconstruction, and/or weight based adjustment of the mA/kV was utilized to reduce the radiation dose to as low as reasonably achievable. COMPARISON: None available. CLINICAL HISTORY: Recent fall with neck pain. FINDINGS: BONES AND ALIGNMENT: 7 cervical segments are well visualized. Straightening of the normal cervical lordosis is noted. Mild degenerative anterolisthesis of C4 is noted. No acute fracture or acute facet abnormality is noted. DEGENERATIVE CHANGES: Mild osteophytic changes are seen. Facet hypertrophic changes are noted. SOFT TISSUES: Surrounding soft tissue structures are within normal limits. LUNG APICES: Visualized lung apices demonstrate apical scarring on the left stable from the prior examination. IMPRESSION: 1. No acute fracture or acute facet abnormality. 2. Straightening of the normal cervical lordosis. 3. Mild degenerative anterolisthesis of C4 with mild osteophytic and facet hypertrophic changes. Electronically signed by: Oneil Devonshire MD 10/10/2024 06:19 PM EST RP Workstation: [id number]   CT Head Wo Contrast Result Date: 10/10/2024 EXAM: CT HEAD WITHOUT CONTRAST 10/10/2024 06:12:00 PM TECHNIQUE: CT of the head was performed without the administration of intravenous contrast. Automated exposure control, iterative reconstruction, and/or weight based adjustment of the mA/kV was utilized to reduce the radiation dose to as low as reasonably achievable. COMPARISON: 09/27/2024 CLINICAL HISTORY: Recent fall with head injury is the clinical history. FINDINGS: BRAIN AND VENTRICLES: No acute hemorrhage. No evidence of acute infarct. Remote lacunar infarct in right basal ganglia. Nonspecific periventricular and subcortical white matter  hypoattenuation, likely chronic microvascular ischemic changes. Mild parenchymal volume loss. No extra-axial collection. No mass effect or midline shift. Carotid siphon and intracranial vertebral artery atherosclerotic calcifications. ORBITS: No acute abnormality. SINUSES: Bilateral maxillary sinus mucosal thickening and mucous retention cysts. Mild ethmoid sinus mucosal thickening. Left frontal sinus osteoma. SOFT TISSUES AND SKULL: No acute soft tissue abnormality. No skull fracture. IMPRESSION: 1. Chronic atrophic and ischemic changes without acute abnormality.  Electronically signed by: Oneil Devonshire MD 10/10/2024 06:17 PM EST RP Workstation: [id number]     Procedures   Medications Ordered in the ED - No data to display                                    79 year old male with a history of coronary artery disease, atrial fibrillation on Eliquis , home O2 at night 2L, CVA, hyperlipidemia, hypertension, CHF, CKD, type 2 diabetes, history of myasthenia gravis with recent admission for acute hypoxic respiratory failure secondary to aspiration pneumonia and discharged on January 19 who presents with concern for fall.  Given age with fall on eliquis /trauma CT head and CSpine completed and without signs of ICH or fracture.  Denies other traumatic injuries. Is alert, oriented, able to describe the incident in detail and sounds to be a loss of balance while reaching and no history to suggest near-syncope, syncope. He denies any other medical concerns.  Given no other new symptoms or concerns other than this mechanical fall, feel he is stable for discharge home. Removed home O2 while I was in the room with no desaturation, replaced it as he is resting in room/possible sleeping given his history of O2 use with sleep.  Will call [facility] given family concerns regarding getting him into home with ice, home O2 use and fall risk. Recommend returning if any new or worsening symptoms or concerns.      Final diagnoses:   Fall, initial encounter    ED Discharge Orders     None          Dreama Longs, MD 10/10/24 2001  "

## 2024-10-10 NOTE — ED Notes (Signed)
 No lobby

## 2024-10-10 NOTE — ED Notes (Signed)
 Called family member to pick pt up for discharge states she can't d/t ice wants us  to keep pt, EDP made aware.

## 2024-10-10 NOTE — ED Triage Notes (Signed)
 PT BIB [facility] d/t mechanical fall Pt was found prone onto ground walker underneath him.  No LOC no head trauma, on blood thinners Hx of syncope episodes Hx myasthenias gravis  158/70 bp 84 pulse 20 rr 229 CBG

## 2024-10-10 NOTE — ED Notes (Signed)
 Patient transported to CT

## 2025-01-04 ENCOUNTER — Ambulatory Visit: Admitting: Podiatry
